# Patient Record
Sex: Female | Born: 1935
Health system: Southern US, Community
[De-identification: ages and names within clinical notes are randomized; demographics above are authoritative.]

## PROBLEM LIST (undated history)

## (undated) DIAGNOSIS — G629 Polyneuropathy, unspecified: Secondary | ICD-10-CM

## (undated) DIAGNOSIS — K219 Gastro-esophageal reflux disease without esophagitis: Secondary | ICD-10-CM

## (undated) DIAGNOSIS — M5030 Other cervical disc degeneration, unspecified cervical region: Secondary | ICD-10-CM

## (undated) DIAGNOSIS — K449 Diaphragmatic hernia without obstruction or gangrene: Secondary | ICD-10-CM

## (undated) DIAGNOSIS — Z85038 Personal history of other malignant neoplasm of large intestine: Secondary | ICD-10-CM

## (undated) DIAGNOSIS — R0602 Shortness of breath: Secondary | ICD-10-CM

## (undated) DIAGNOSIS — L89602 Pressure ulcer of unspecified heel, stage 2: Secondary | ICD-10-CM

## (undated) DIAGNOSIS — M1990 Unspecified osteoarthritis, unspecified site: Secondary | ICD-10-CM

## (undated) DIAGNOSIS — I10 Essential (primary) hypertension: Secondary | ICD-10-CM

## (undated) DIAGNOSIS — C189 Malignant neoplasm of colon, unspecified: Secondary | ICD-10-CM

## (undated) DIAGNOSIS — I509 Heart failure, unspecified: Secondary | ICD-10-CM

## (undated) DIAGNOSIS — I720 Aneurysm of carotid artery: Secondary | ICD-10-CM

## (undated) DIAGNOSIS — K5790 Diverticulosis of intestine, part unspecified, without perforation or abscess without bleeding: Secondary | ICD-10-CM

## (undated) DIAGNOSIS — J189 Pneumonia, unspecified organism: Secondary | ICD-10-CM

## (undated) DIAGNOSIS — T7840XA Allergy, unspecified, initial encounter: Secondary | ICD-10-CM

## (undated) DIAGNOSIS — Z8719 Personal history of other diseases of the digestive system: Secondary | ICD-10-CM

## (undated) DIAGNOSIS — E785 Hyperlipidemia, unspecified: Secondary | ICD-10-CM

## (undated) DIAGNOSIS — D649 Anemia, unspecified: Secondary | ICD-10-CM

## (undated) DIAGNOSIS — C50919 Malignant neoplasm of unspecified site of unspecified female breast: Secondary | ICD-10-CM

## (undated) DIAGNOSIS — I2510 Atherosclerotic heart disease of native coronary artery without angina pectoris: Secondary | ICD-10-CM

## (undated) DIAGNOSIS — I219 Acute myocardial infarction, unspecified: Secondary | ICD-10-CM

## (undated) HISTORY — DX: Hyperlipidemia, unspecified: E78.5

## (undated) HISTORY — DX: Allergy, unspecified, initial encounter: T78.40XA

## (undated) HISTORY — DX: Malignant neoplasm of colon, unspecified: C18.9

## (undated) HISTORY — DX: Essential (primary) hypertension: I10

## (undated) HISTORY — DX: Personal history of other diseases of the digestive system: Z87.19

## (undated) HISTORY — DX: Diaphragmatic hernia without obstruction or gangrene: K44.9

## (undated) HISTORY — DX: Atherosclerotic heart disease of native coronary artery without angina pectoris: I25.10

## (undated) HISTORY — DX: Anemia, unspecified: D64.9

## (undated) HISTORY — DX: Malignant neoplasm of unspecified site of unspecified female breast: C50.919

## (undated) HISTORY — DX: Polyneuropathy, unspecified: G62.9

## (undated) HISTORY — DX: Gastro-esophageal reflux disease without esophagitis: K21.9

## (undated) HISTORY — PX: BREAST SURGERY: SHX581

## (undated) HISTORY — DX: Acute myocardial infarction, unspecified: I21.9

## (undated) HISTORY — DX: Hypercalcemia: E83.52

## (undated) HISTORY — DX: Heart failure, unspecified: I50.9

## (undated) HISTORY — DX: Personal history of other malignant neoplasm of large intestine: Z85.038

## (undated) HISTORY — DX: Unspecified osteoarthritis, unspecified site: M19.90

## (undated) HISTORY — DX: Diverticulosis of intestine, part unspecified, without perforation or abscess without bleeding: K57.90

---

## 1998-03-13 ENCOUNTER — Encounter (INDEPENDENT_AMBULATORY_CARE_PROVIDER_SITE_OTHER): Payer: Self-pay | Admitting: *Deleted

## 1998-03-13 LAB — CONVERTED CEMR LAB

## 1998-04-02 ENCOUNTER — Encounter: Admission: RE | Admit: 1998-04-02 | Discharge: 1998-04-02 | Payer: Self-pay | Admitting: Family Medicine

## 1998-04-09 ENCOUNTER — Encounter: Admission: RE | Admit: 1998-04-09 | Discharge: 1998-04-09 | Payer: Self-pay | Admitting: Family Medicine

## 1998-04-23 ENCOUNTER — Encounter: Admission: RE | Admit: 1998-04-23 | Discharge: 1998-04-23 | Payer: Self-pay | Admitting: Family Medicine

## 1998-05-06 ENCOUNTER — Emergency Department (HOSPITAL_COMMUNITY): Admission: EM | Admit: 1998-05-06 | Discharge: 1998-05-06 | Payer: Self-pay | Admitting: Emergency Medicine

## 1998-06-02 ENCOUNTER — Ambulatory Visit (HOSPITAL_COMMUNITY): Admission: RE | Admit: 1998-06-02 | Discharge: 1998-06-02 | Payer: Self-pay | Admitting: Family Medicine

## 1998-06-02 ENCOUNTER — Encounter: Admission: RE | Admit: 1998-06-02 | Discharge: 1998-06-02 | Payer: Self-pay | Admitting: Family Medicine

## 1998-06-07 ENCOUNTER — Encounter: Admission: RE | Admit: 1998-06-07 | Discharge: 1998-06-07 | Payer: Self-pay | Admitting: Family Medicine

## 1998-07-02 ENCOUNTER — Emergency Department (HOSPITAL_COMMUNITY): Admission: EM | Admit: 1998-07-02 | Discharge: 1998-07-02 | Payer: Self-pay | Admitting: Emergency Medicine

## 1998-07-08 ENCOUNTER — Encounter: Admission: RE | Admit: 1998-07-08 | Discharge: 1998-07-08 | Payer: Self-pay | Admitting: Family Medicine

## 1998-08-12 ENCOUNTER — Emergency Department (HOSPITAL_COMMUNITY): Admission: EM | Admit: 1998-08-12 | Discharge: 1998-08-12 | Payer: Self-pay | Admitting: Emergency Medicine

## 1998-11-29 ENCOUNTER — Encounter: Admission: RE | Admit: 1998-11-29 | Discharge: 1998-11-29 | Payer: Self-pay | Admitting: Family Medicine

## 1998-12-02 ENCOUNTER — Encounter: Admission: RE | Admit: 1998-12-02 | Discharge: 1998-12-02 | Payer: Self-pay | Admitting: Family Medicine

## 1998-12-20 ENCOUNTER — Encounter: Admission: RE | Admit: 1998-12-20 | Discharge: 1998-12-20 | Payer: Self-pay | Admitting: Sports Medicine

## 1999-01-24 ENCOUNTER — Emergency Department (HOSPITAL_COMMUNITY): Admission: EM | Admit: 1999-01-24 | Discharge: 1999-01-24 | Payer: Self-pay | Admitting: Emergency Medicine

## 1999-01-24 ENCOUNTER — Encounter: Payer: Self-pay | Admitting: Emergency Medicine

## 1999-01-28 ENCOUNTER — Emergency Department (HOSPITAL_COMMUNITY): Admission: EM | Admit: 1999-01-28 | Discharge: 1999-01-28 | Payer: Self-pay | Admitting: Emergency Medicine

## 1999-02-01 ENCOUNTER — Encounter: Admission: RE | Admit: 1999-02-01 | Discharge: 1999-02-01 | Payer: Self-pay | Admitting: Sports Medicine

## 1999-05-11 ENCOUNTER — Encounter: Admission: RE | Admit: 1999-05-11 | Discharge: 1999-05-11 | Payer: Self-pay | Admitting: Sports Medicine

## 1999-06-03 ENCOUNTER — Encounter: Admission: RE | Admit: 1999-06-03 | Discharge: 1999-06-03 | Payer: Self-pay | Admitting: Family Medicine

## 1999-08-22 ENCOUNTER — Encounter: Admission: RE | Admit: 1999-08-22 | Discharge: 1999-08-22 | Payer: Self-pay | Admitting: Family Medicine

## 1999-08-29 ENCOUNTER — Encounter: Admission: RE | Admit: 1999-08-29 | Discharge: 1999-08-29 | Payer: Self-pay | Admitting: Family Medicine

## 1999-08-31 ENCOUNTER — Encounter: Admission: RE | Admit: 1999-08-31 | Discharge: 1999-08-31 | Payer: Self-pay | Admitting: Family Medicine

## 1999-09-15 ENCOUNTER — Encounter: Admission: RE | Admit: 1999-09-15 | Discharge: 1999-09-15 | Payer: Self-pay | Admitting: Family Medicine

## 1999-11-14 HISTORY — PX: TOTAL KNEE ARTHROPLASTY: SHX125

## 1999-11-14 HISTORY — PX: ESOPHAGOGASTRODUODENOSCOPY: SHX1529

## 1999-11-14 HISTORY — PX: JOINT REPLACEMENT: SHX530

## 1999-12-07 ENCOUNTER — Encounter: Admission: RE | Admit: 1999-12-07 | Discharge: 1999-12-07 | Payer: Self-pay

## 1999-12-22 ENCOUNTER — Encounter: Admission: RE | Admit: 1999-12-22 | Discharge: 1999-12-22 | Payer: Self-pay | Admitting: Sports Medicine

## 1999-12-22 ENCOUNTER — Encounter: Payer: Self-pay | Admitting: Sports Medicine

## 2000-01-12 ENCOUNTER — Ambulatory Visit (HOSPITAL_COMMUNITY): Admission: RE | Admit: 2000-01-12 | Discharge: 2000-01-12 | Payer: Self-pay | Admitting: Gastroenterology

## 2000-04-10 ENCOUNTER — Emergency Department (HOSPITAL_COMMUNITY): Admission: EM | Admit: 2000-04-10 | Discharge: 2000-04-10 | Payer: Self-pay | Admitting: Emergency Medicine

## 2000-04-10 ENCOUNTER — Encounter: Payer: Self-pay | Admitting: Emergency Medicine

## 2000-05-15 ENCOUNTER — Encounter: Admission: RE | Admit: 2000-05-15 | Discharge: 2000-05-15 | Payer: Self-pay | Admitting: Family Medicine

## 2000-05-18 ENCOUNTER — Encounter: Admission: RE | Admit: 2000-05-18 | Discharge: 2000-05-18 | Payer: Self-pay | Admitting: Family Medicine

## 2000-05-28 ENCOUNTER — Encounter: Admission: RE | Admit: 2000-05-28 | Discharge: 2000-05-28 | Payer: Self-pay | Admitting: Family Medicine

## 2000-06-15 ENCOUNTER — Encounter: Admission: RE | Admit: 2000-06-15 | Discharge: 2000-06-15 | Payer: Self-pay | Admitting: Family Medicine

## 2000-06-18 ENCOUNTER — Encounter: Payer: Self-pay | Admitting: Gastroenterology

## 2000-06-20 ENCOUNTER — Inpatient Hospital Stay (HOSPITAL_COMMUNITY): Admission: RE | Admit: 2000-06-20 | Discharge: 2000-06-28 | Payer: Self-pay | Admitting: Orthopedic Surgery

## 2000-06-21 ENCOUNTER — Encounter: Payer: Self-pay | Admitting: Orthopedic Surgery

## 2000-06-25 ENCOUNTER — Encounter: Payer: Self-pay | Admitting: Orthopedic Surgery

## 2000-07-25 ENCOUNTER — Encounter: Admission: RE | Admit: 2000-07-25 | Discharge: 2000-07-25 | Payer: Self-pay | Admitting: Family Medicine

## 2000-09-17 ENCOUNTER — Encounter: Admission: RE | Admit: 2000-09-17 | Discharge: 2000-09-17 | Payer: Self-pay | Admitting: Family Medicine

## 2000-09-24 ENCOUNTER — Encounter: Admission: RE | Admit: 2000-09-24 | Discharge: 2000-10-11 | Payer: Self-pay | Admitting: Orthopedic Surgery

## 2000-10-01 ENCOUNTER — Ambulatory Visit: Admission: RE | Admit: 2000-10-01 | Discharge: 2000-10-01 | Payer: Self-pay | Admitting: Sports Medicine

## 2000-10-01 ENCOUNTER — Encounter: Admission: RE | Admit: 2000-10-01 | Discharge: 2000-10-01 | Payer: Self-pay | Admitting: Sports Medicine

## 2000-10-30 ENCOUNTER — Encounter: Admission: RE | Admit: 2000-10-30 | Discharge: 2000-10-30 | Payer: Self-pay | Admitting: Sports Medicine

## 2001-01-02 ENCOUNTER — Encounter: Admission: RE | Admit: 2001-01-02 | Discharge: 2001-01-02 | Payer: Self-pay | Admitting: Sports Medicine

## 2001-01-02 ENCOUNTER — Encounter: Payer: Self-pay | Admitting: Sports Medicine

## 2001-03-29 ENCOUNTER — Encounter: Admission: RE | Admit: 2001-03-29 | Discharge: 2001-03-29 | Payer: Self-pay | Admitting: Family Medicine

## 2001-06-24 ENCOUNTER — Emergency Department (HOSPITAL_COMMUNITY): Admission: EM | Admit: 2001-06-24 | Discharge: 2001-06-24 | Payer: Self-pay | Admitting: Emergency Medicine

## 2001-06-24 ENCOUNTER — Encounter: Admission: RE | Admit: 2001-06-24 | Discharge: 2001-06-24 | Payer: Self-pay | Admitting: Pediatrics

## 2001-07-12 ENCOUNTER — Encounter: Admission: RE | Admit: 2001-07-12 | Discharge: 2001-07-12 | Payer: Self-pay | Admitting: Family Medicine

## 2001-08-09 ENCOUNTER — Encounter: Admission: RE | Admit: 2001-08-09 | Discharge: 2001-08-09 | Payer: Self-pay | Admitting: Family Medicine

## 2001-10-23 ENCOUNTER — Encounter: Admission: RE | Admit: 2001-10-23 | Discharge: 2001-10-23 | Payer: Self-pay | Admitting: Family Medicine

## 2002-01-06 ENCOUNTER — Encounter: Admission: RE | Admit: 2002-01-06 | Discharge: 2002-01-06 | Payer: Self-pay | Admitting: *Deleted

## 2002-01-06 ENCOUNTER — Encounter: Payer: Self-pay | Admitting: *Deleted

## 2002-01-20 ENCOUNTER — Encounter: Admission: RE | Admit: 2002-01-20 | Discharge: 2002-01-20 | Payer: Self-pay | Admitting: Family Medicine

## 2002-02-25 ENCOUNTER — Encounter: Admission: RE | Admit: 2002-02-25 | Discharge: 2002-02-25 | Payer: Self-pay | Admitting: Family Medicine

## 2002-03-04 ENCOUNTER — Encounter: Admission: RE | Admit: 2002-03-04 | Discharge: 2002-03-04 | Payer: Self-pay | Admitting: Family Medicine

## 2002-03-05 ENCOUNTER — Emergency Department (HOSPITAL_COMMUNITY): Admission: EM | Admit: 2002-03-05 | Discharge: 2002-03-05 | Payer: Self-pay

## 2002-03-13 ENCOUNTER — Encounter: Admission: RE | Admit: 2002-03-13 | Discharge: 2002-03-13 | Payer: Self-pay | Admitting: Family Medicine

## 2002-03-20 ENCOUNTER — Encounter: Admission: RE | Admit: 2002-03-20 | Discharge: 2002-03-20 | Payer: Self-pay | Admitting: Family Medicine

## 2002-03-21 ENCOUNTER — Ambulatory Visit (HOSPITAL_COMMUNITY): Admission: RE | Admit: 2002-03-21 | Discharge: 2002-03-21 | Payer: Self-pay | Admitting: *Deleted

## 2002-06-16 ENCOUNTER — Encounter: Admission: RE | Admit: 2002-06-16 | Discharge: 2002-06-16 | Payer: Self-pay | Admitting: Family Medicine

## 2002-06-26 ENCOUNTER — Encounter: Admission: RE | Admit: 2002-06-26 | Discharge: 2002-06-26 | Payer: Self-pay | Admitting: Family Medicine

## 2002-06-30 ENCOUNTER — Encounter: Admission: RE | Admit: 2002-06-30 | Discharge: 2002-07-24 | Payer: Self-pay | Admitting: Orthopedic Surgery

## 2002-07-03 ENCOUNTER — Encounter: Admission: RE | Admit: 2002-07-03 | Discharge: 2002-07-03 | Payer: Self-pay | Admitting: Family Medicine

## 2002-08-15 ENCOUNTER — Encounter: Admission: RE | Admit: 2002-08-15 | Discharge: 2002-08-15 | Payer: Self-pay | Admitting: Family Medicine

## 2002-08-29 ENCOUNTER — Encounter: Admission: RE | Admit: 2002-08-29 | Discharge: 2002-08-29 | Payer: Self-pay | Admitting: Family Medicine

## 2002-10-15 ENCOUNTER — Encounter: Admission: RE | Admit: 2002-10-15 | Discharge: 2002-10-15 | Payer: Self-pay | Admitting: Family Medicine

## 2002-11-12 ENCOUNTER — Encounter: Admission: RE | Admit: 2002-11-12 | Discharge: 2002-11-12 | Payer: Self-pay | Admitting: Family Medicine

## 2002-11-24 ENCOUNTER — Encounter: Admission: RE | Admit: 2002-11-24 | Discharge: 2002-11-24 | Payer: Self-pay | Admitting: Sports Medicine

## 2002-12-25 ENCOUNTER — Encounter: Admission: RE | Admit: 2002-12-25 | Discharge: 2002-12-25 | Payer: Self-pay | Admitting: Family Medicine

## 2003-01-03 ENCOUNTER — Emergency Department (HOSPITAL_COMMUNITY): Admission: EM | Admit: 2003-01-03 | Discharge: 2003-01-03 | Payer: Self-pay | Admitting: Emergency Medicine

## 2003-01-03 ENCOUNTER — Encounter: Payer: Self-pay | Admitting: Emergency Medicine

## 2003-01-12 ENCOUNTER — Encounter: Admission: RE | Admit: 2003-01-12 | Discharge: 2003-01-12 | Payer: Self-pay | Admitting: *Deleted

## 2003-01-15 ENCOUNTER — Encounter: Admission: RE | Admit: 2003-01-15 | Discharge: 2003-01-15 | Payer: Self-pay | Admitting: Sports Medicine

## 2003-01-15 ENCOUNTER — Encounter: Payer: Self-pay | Admitting: Sports Medicine

## 2003-01-26 ENCOUNTER — Encounter: Admission: RE | Admit: 2003-01-26 | Discharge: 2003-01-26 | Payer: Self-pay | Admitting: Family Medicine

## 2003-05-04 ENCOUNTER — Emergency Department (HOSPITAL_COMMUNITY): Admission: AD | Admit: 2003-05-04 | Discharge: 2003-05-04 | Payer: Self-pay | Admitting: Emergency Medicine

## 2003-05-14 ENCOUNTER — Encounter: Admission: RE | Admit: 2003-05-14 | Discharge: 2003-05-14 | Payer: Self-pay | Admitting: Family Medicine

## 2003-05-15 ENCOUNTER — Encounter: Admission: RE | Admit: 2003-05-15 | Discharge: 2003-05-15 | Payer: Self-pay | Admitting: Family Medicine

## 2003-06-08 ENCOUNTER — Encounter: Admission: RE | Admit: 2003-06-08 | Discharge: 2003-06-08 | Payer: Self-pay | Admitting: Sports Medicine

## 2003-11-16 ENCOUNTER — Encounter: Admission: RE | Admit: 2003-11-16 | Discharge: 2003-11-16 | Payer: Self-pay | Admitting: Family Medicine

## 2003-11-24 ENCOUNTER — Encounter: Admission: RE | Admit: 2003-11-24 | Discharge: 2003-11-24 | Payer: Self-pay | Admitting: Family Medicine

## 2003-12-28 ENCOUNTER — Emergency Department (HOSPITAL_COMMUNITY): Admission: EM | Admit: 2003-12-28 | Discharge: 2003-12-28 | Payer: Self-pay | Admitting: Family Medicine

## 2003-12-29 ENCOUNTER — Encounter: Admission: RE | Admit: 2003-12-29 | Discharge: 2003-12-29 | Payer: Self-pay | Admitting: Family Medicine

## 2004-01-06 ENCOUNTER — Encounter: Admission: RE | Admit: 2004-01-06 | Discharge: 2004-01-06 | Payer: Self-pay | Admitting: Family Medicine

## 2004-01-21 ENCOUNTER — Encounter: Admission: RE | Admit: 2004-01-21 | Discharge: 2004-01-21 | Payer: Self-pay | Admitting: Sports Medicine

## 2004-02-29 ENCOUNTER — Encounter: Admission: RE | Admit: 2004-02-29 | Discharge: 2004-02-29 | Payer: Self-pay | Admitting: Family Medicine

## 2004-03-01 ENCOUNTER — Emergency Department (HOSPITAL_COMMUNITY): Admission: EM | Admit: 2004-03-01 | Discharge: 2004-03-01 | Payer: Self-pay | Admitting: Emergency Medicine

## 2004-03-04 ENCOUNTER — Ambulatory Visit (HOSPITAL_COMMUNITY): Admission: RE | Admit: 2004-03-04 | Discharge: 2004-03-04 | Payer: Self-pay | Admitting: Family Medicine

## 2004-03-04 ENCOUNTER — Encounter: Admission: RE | Admit: 2004-03-04 | Discharge: 2004-03-04 | Payer: Self-pay | Admitting: Family Medicine

## 2004-03-15 ENCOUNTER — Encounter: Admission: RE | Admit: 2004-03-15 | Discharge: 2004-03-15 | Payer: Self-pay | Admitting: Sports Medicine
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
## 2004-04-29 ENCOUNTER — Encounter: Admission: RE | Admit: 2004-04-29 | Discharge: 2004-04-29 | Payer: Self-pay | Admitting: Family Medicine

## 2004-05-26 ENCOUNTER — Emergency Department (HOSPITAL_COMMUNITY): Admission: EM | Admit: 2004-05-26 | Discharge: 2004-05-27 | Payer: Self-pay | Admitting: *Deleted

## 2004-06-06 ENCOUNTER — Emergency Department (HOSPITAL_COMMUNITY): Admission: EM | Admit: 2004-06-06 | Discharge: 2004-06-06 | Payer: Self-pay | Admitting: Emergency Medicine

## 2004-06-17 ENCOUNTER — Encounter: Admission: RE | Admit: 2004-06-17 | Discharge: 2004-06-17 | Payer: Self-pay | Admitting: Family Medicine

## 2004-06-28 ENCOUNTER — Emergency Department (HOSPITAL_COMMUNITY): Admission: EM | Admit: 2004-06-28 | Discharge: 2004-06-28 | Payer: Self-pay | Admitting: Emergency Medicine

## 2004-07-06 ENCOUNTER — Encounter: Admission: RE | Admit: 2004-07-06 | Discharge: 2004-07-06 | Payer: Self-pay | Admitting: Family Medicine

## 2004-07-18 IMAGING — CR DG CHEST 1V PORT
1 series · 1 of 1 positions shown · non-contrast
Comparison: none

CLINICAL DATA: Shortness of breath; lt shoulder pain
 PORTABLE CHEST
 Comparing to report from prior chest radiograph dated 01/03/03.  
 There is linear scarring at both lung bases as reported previously.  The lungs appear otherwise clear.  Heart and mediastinum appear unremarkable. 
 IMPRESSION
 Bibasilar scarring.  No acute findings.

[view not recorded]
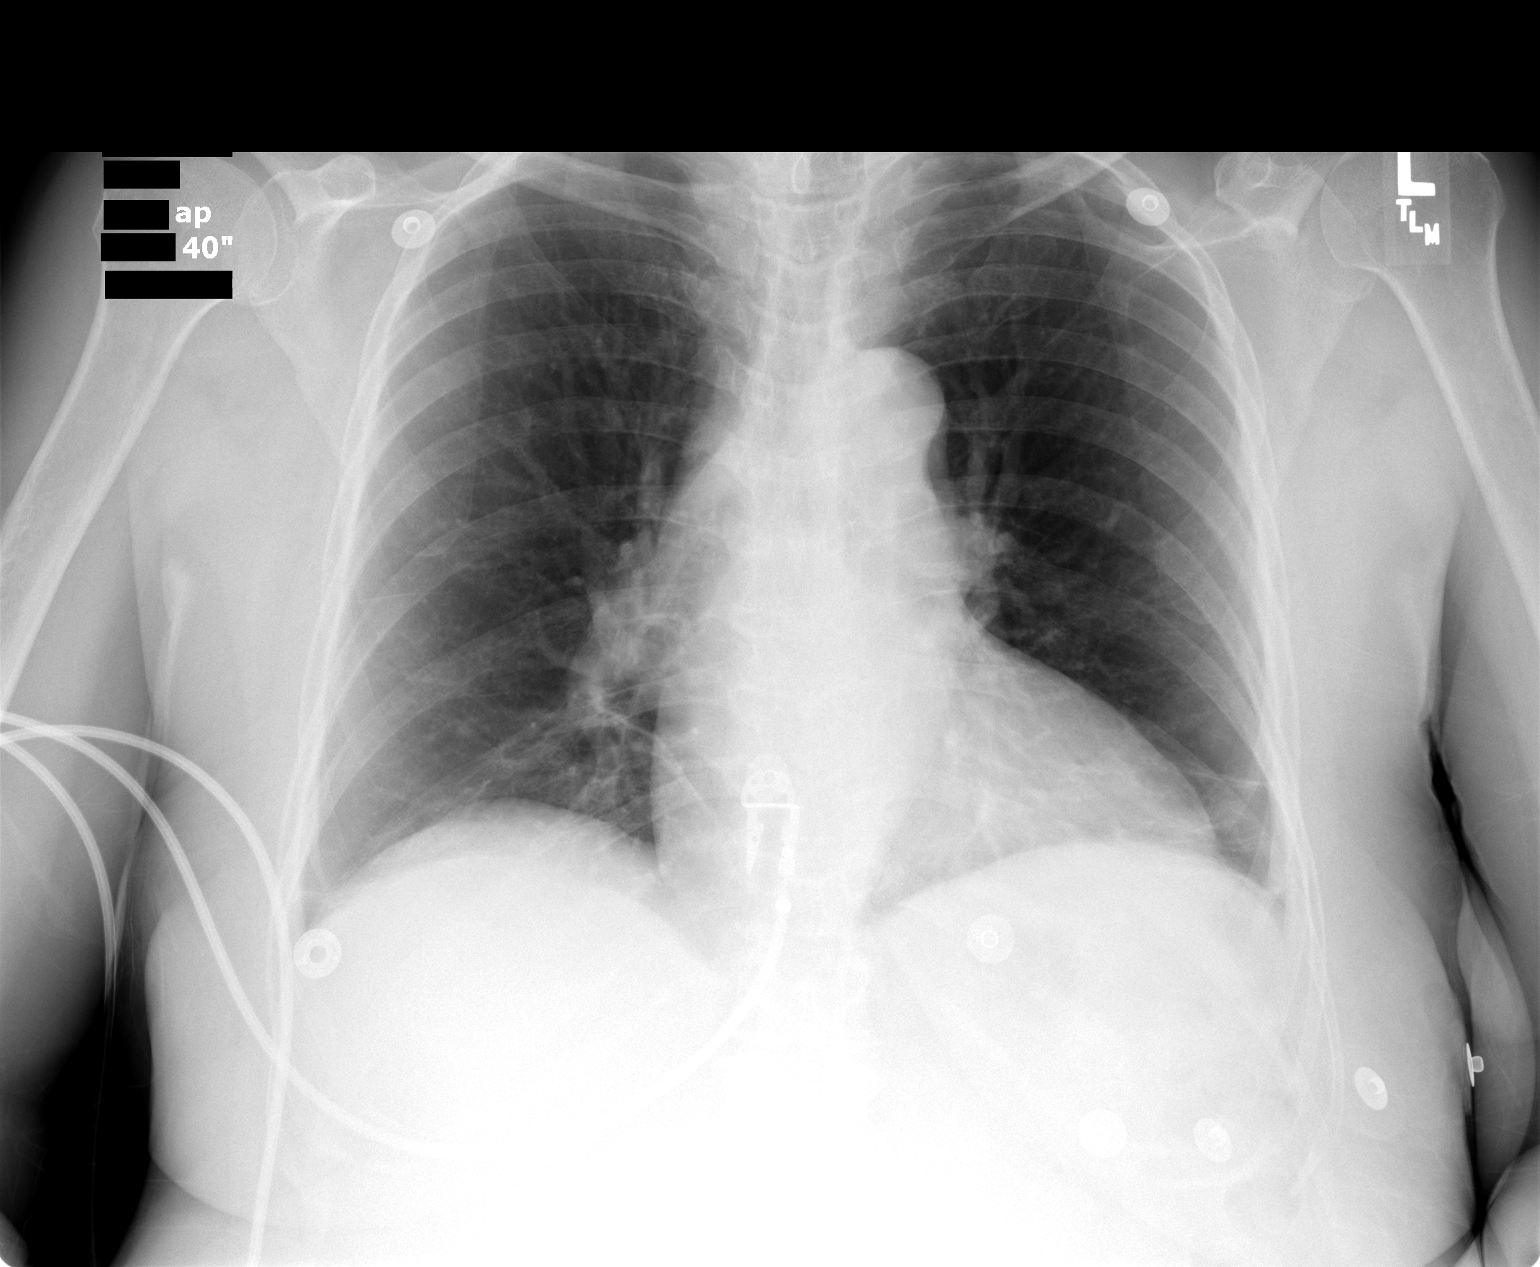

[1 of 1 positions shown; findings below may reference images not displayed]

## 2004-07-28 IMAGING — CR DG HIP COMPLETE 2+V*R*
3 series · 3 of 3 positions shown · non-contrast
Comparison: None.

CLINICAL DATA: 80-year-old with shoulder and leg pain. Patient fell a couple of weeks ago.  Unable to bear weight. 
 RIGHT HIP COMPLETE

[view not recorded (1 of 3)]
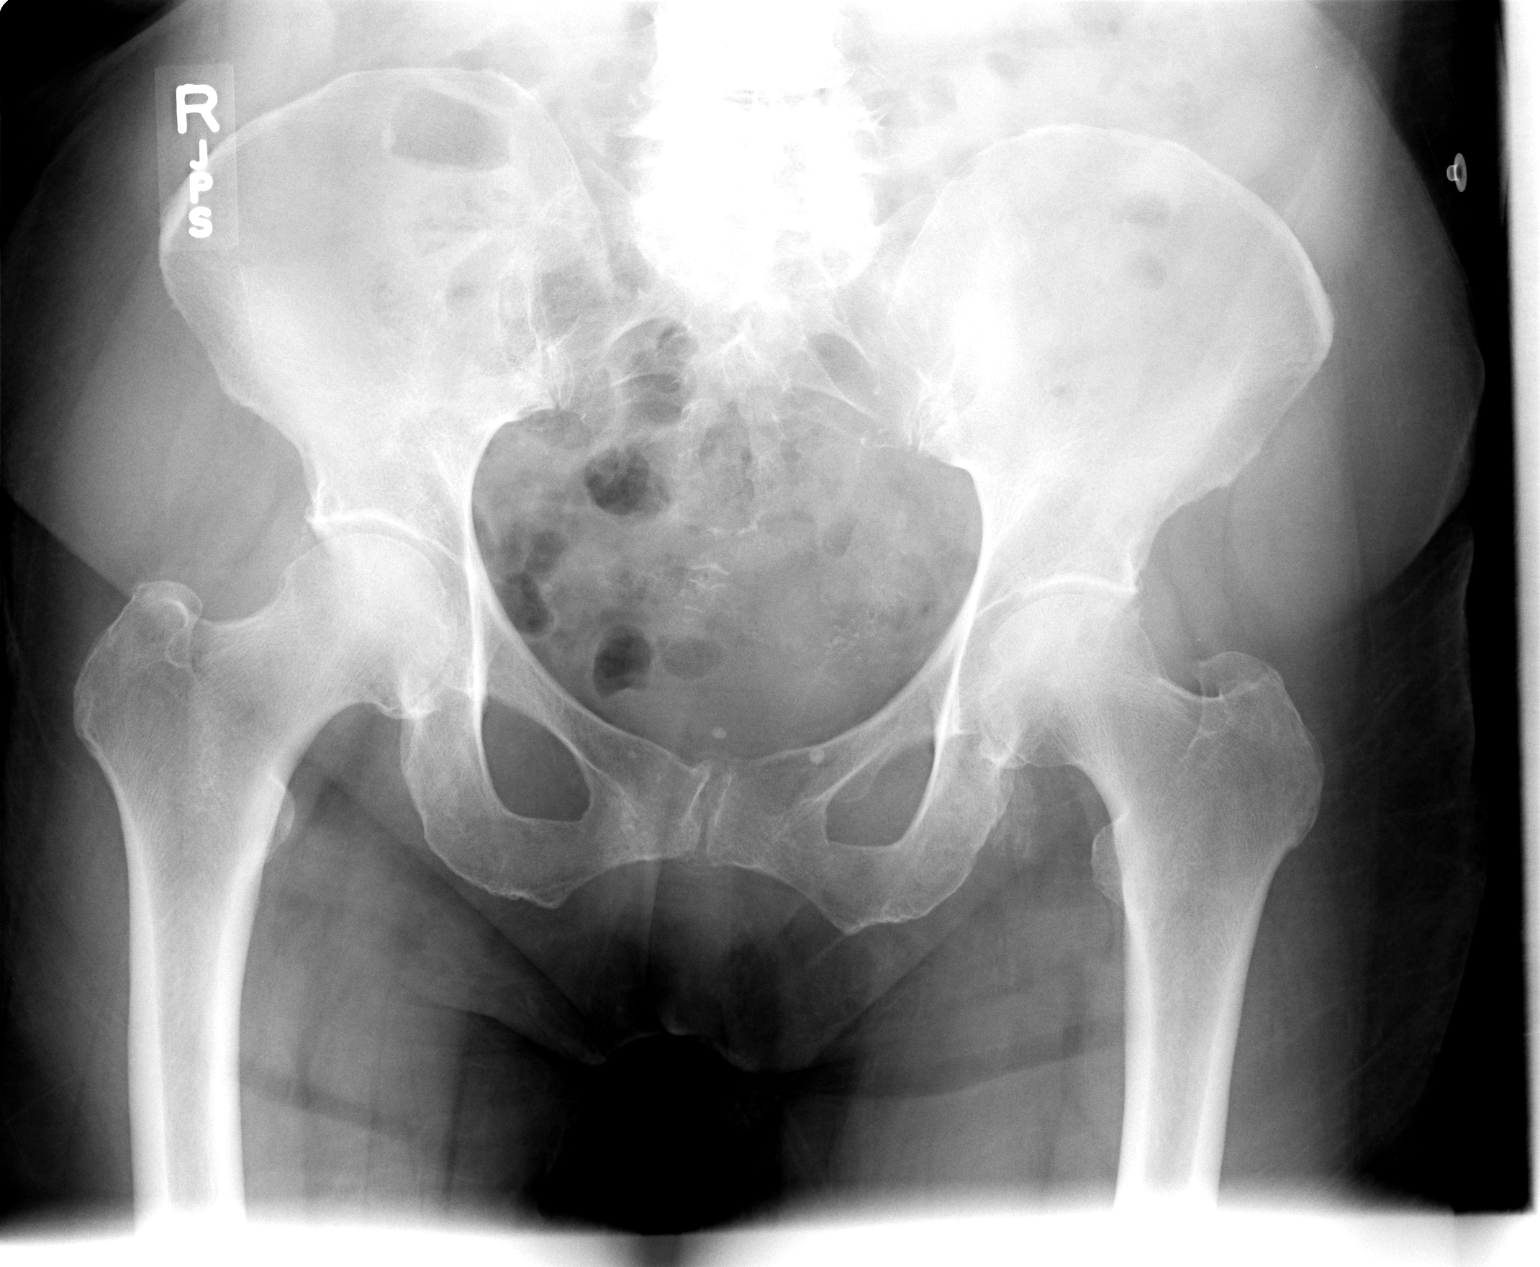

[view not recorded (2 of 3)]
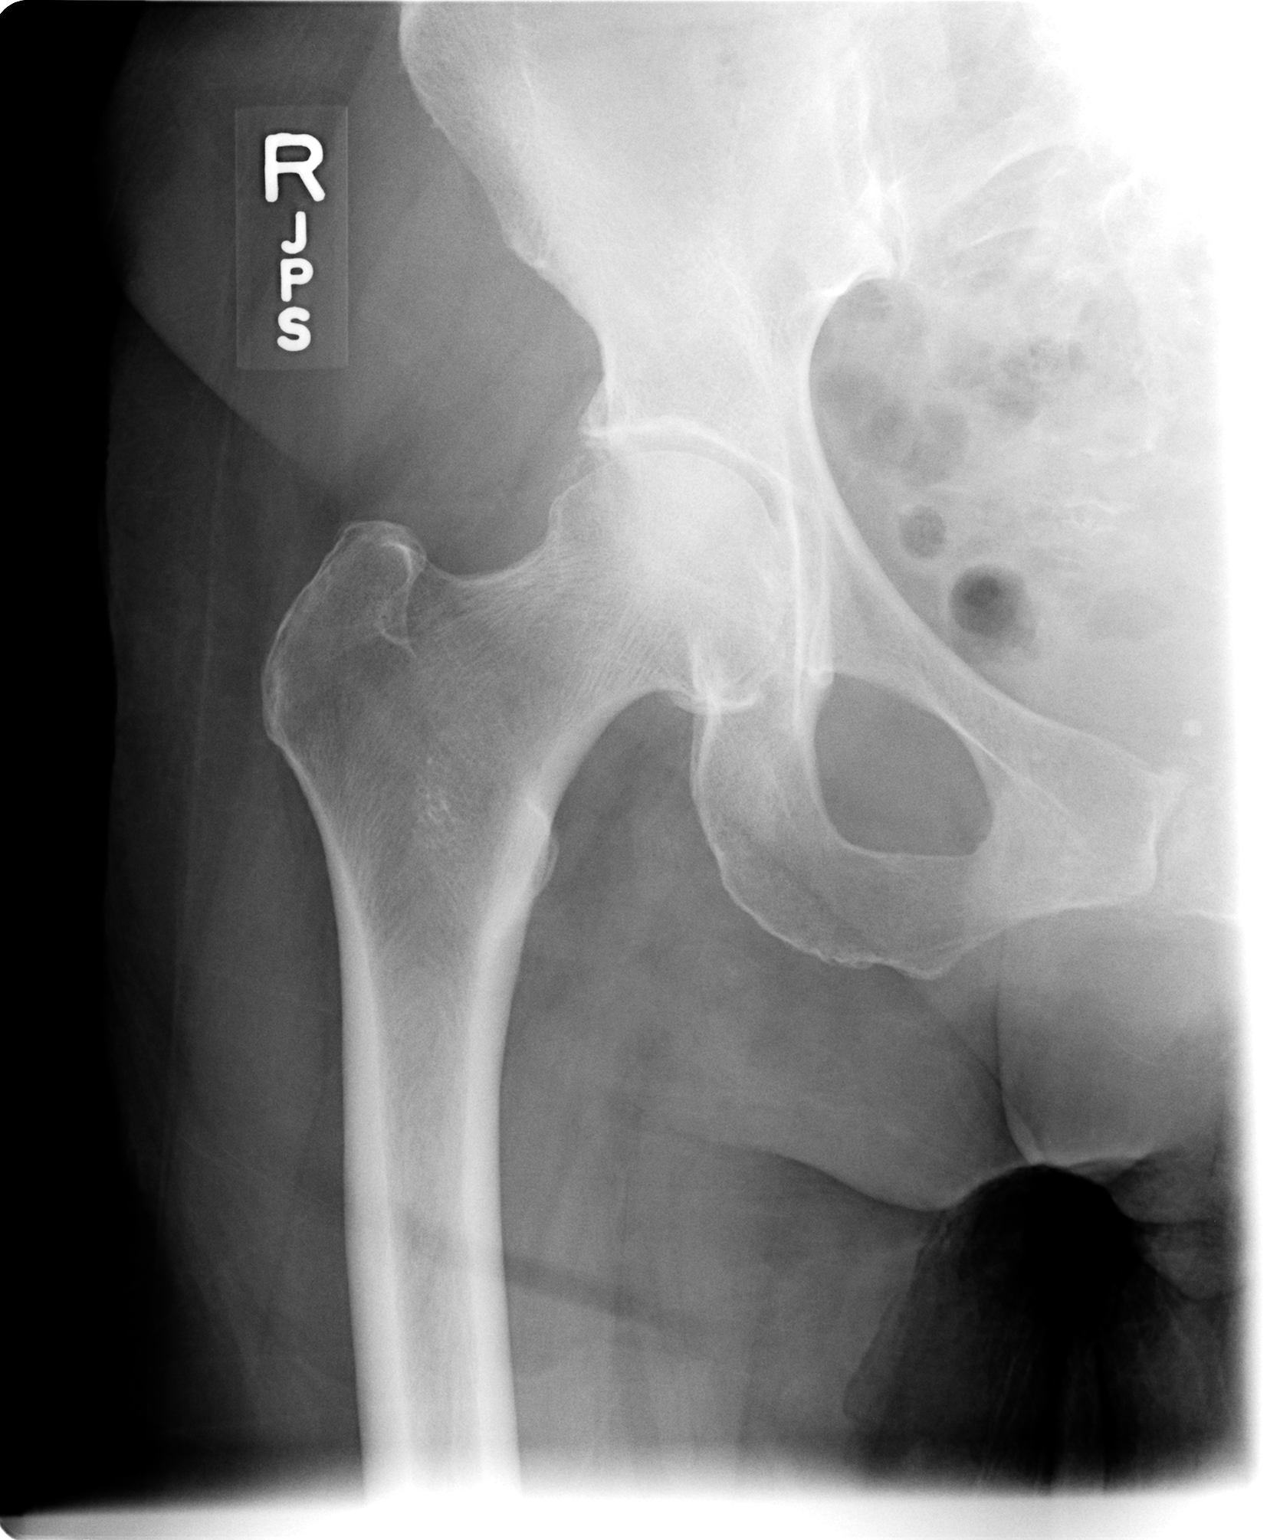

[view not recorded (3 of 3)]
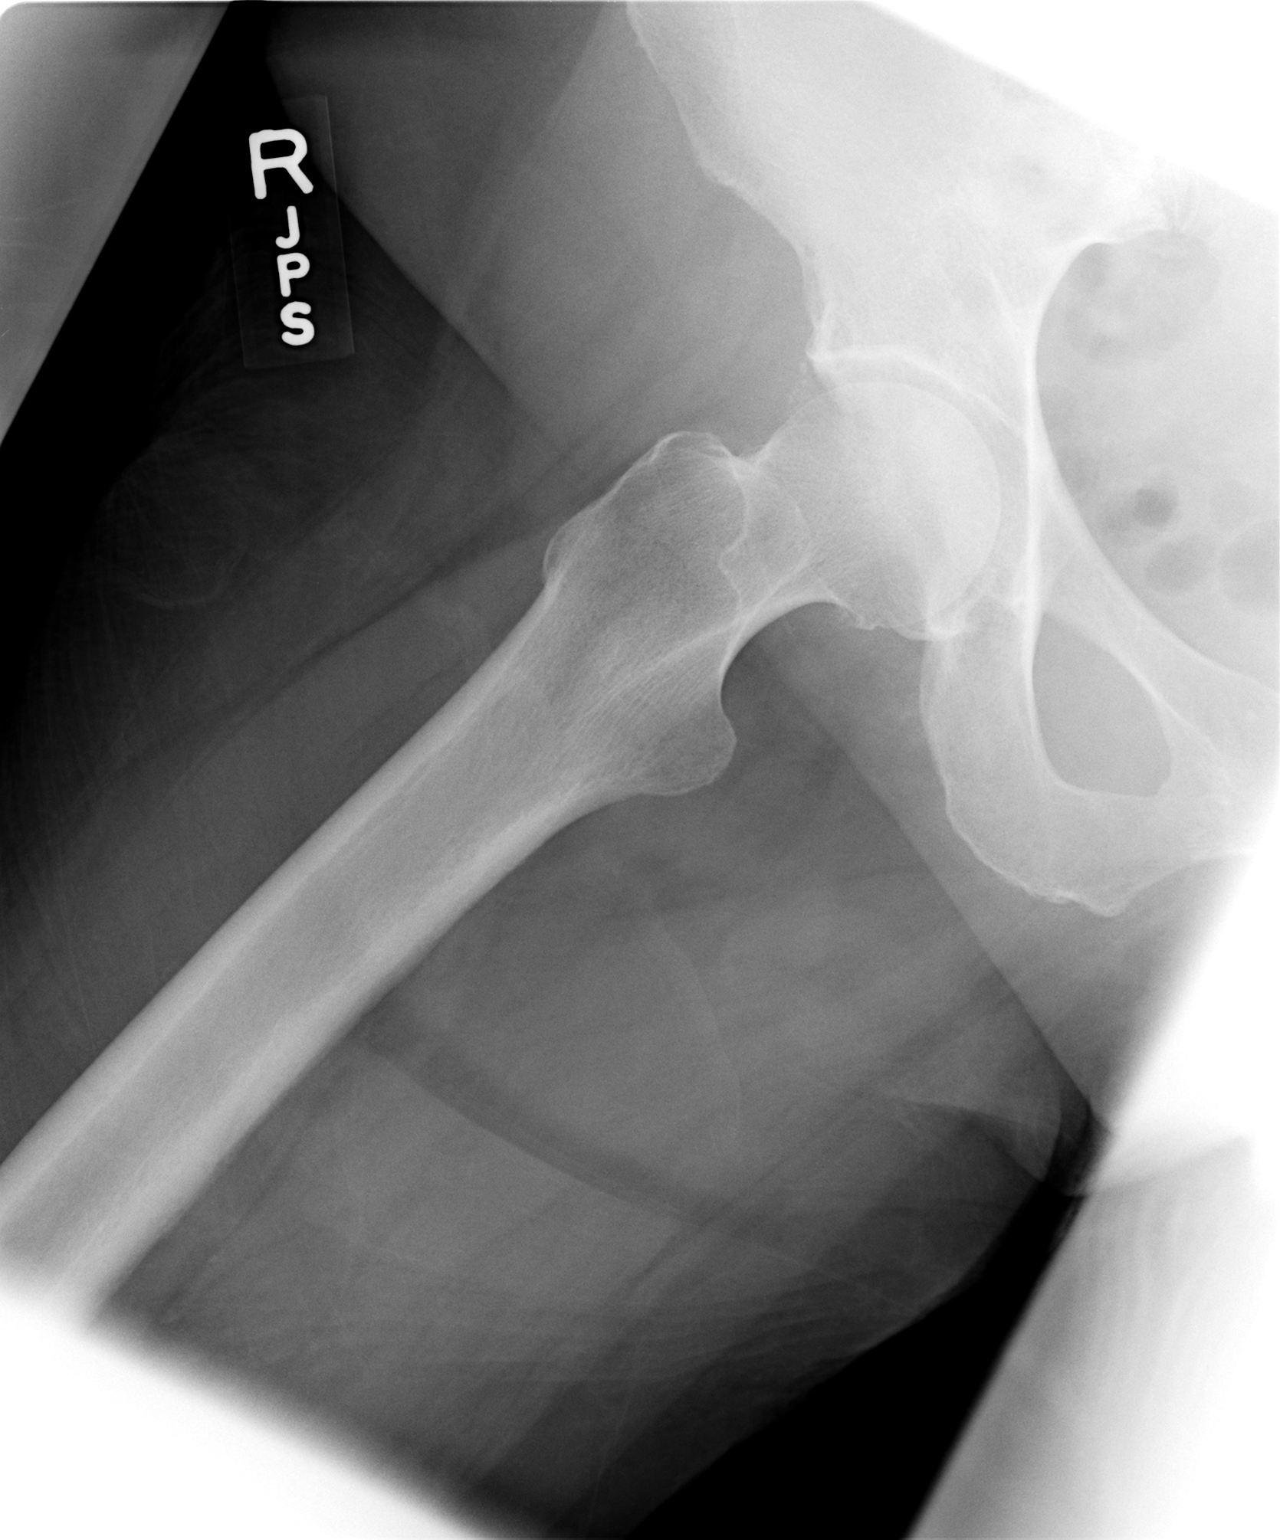

[3 of 3 positions shown; findings below may reference images not displayed]

AP and lateral views of the right hip are performed showing no evidence for acute fracture or dislocation.  Degenerative changes are noted in both hips as well as the lower spine.  Regional bowel gas pattern is nonobstructive. 
 IMPRESSION
 Degenerative changes without evidence for acute abnormality of the right hip.

## 2004-08-04 ENCOUNTER — Encounter: Admission: RE | Admit: 2004-08-04 | Discharge: 2004-08-04 | Payer: Self-pay | Admitting: Sports Medicine

## 2004-08-05 ENCOUNTER — Ambulatory Visit: Payer: Self-pay | Admitting: Family Medicine

## 2004-08-18 ENCOUNTER — Ambulatory Visit: Payer: Self-pay | Admitting: Family Medicine

## 2004-09-22 ENCOUNTER — Ambulatory Visit: Payer: Self-pay | Admitting: Family Medicine

## 2004-09-25 IMAGING — CR DG FINGER THUMB 2+V*L*
2 series · 2 of 2 positions shown · non-contrast
Comparison: none

CLINICAL DATA: Pain. 
 LEFT THUMB: 
 Three views of the left thumb show degenerative change at the base of the left 1st metacarpal as it articulates with carpal bones.  No acute abnormality is seen.

[view not recorded (1 of 2)]
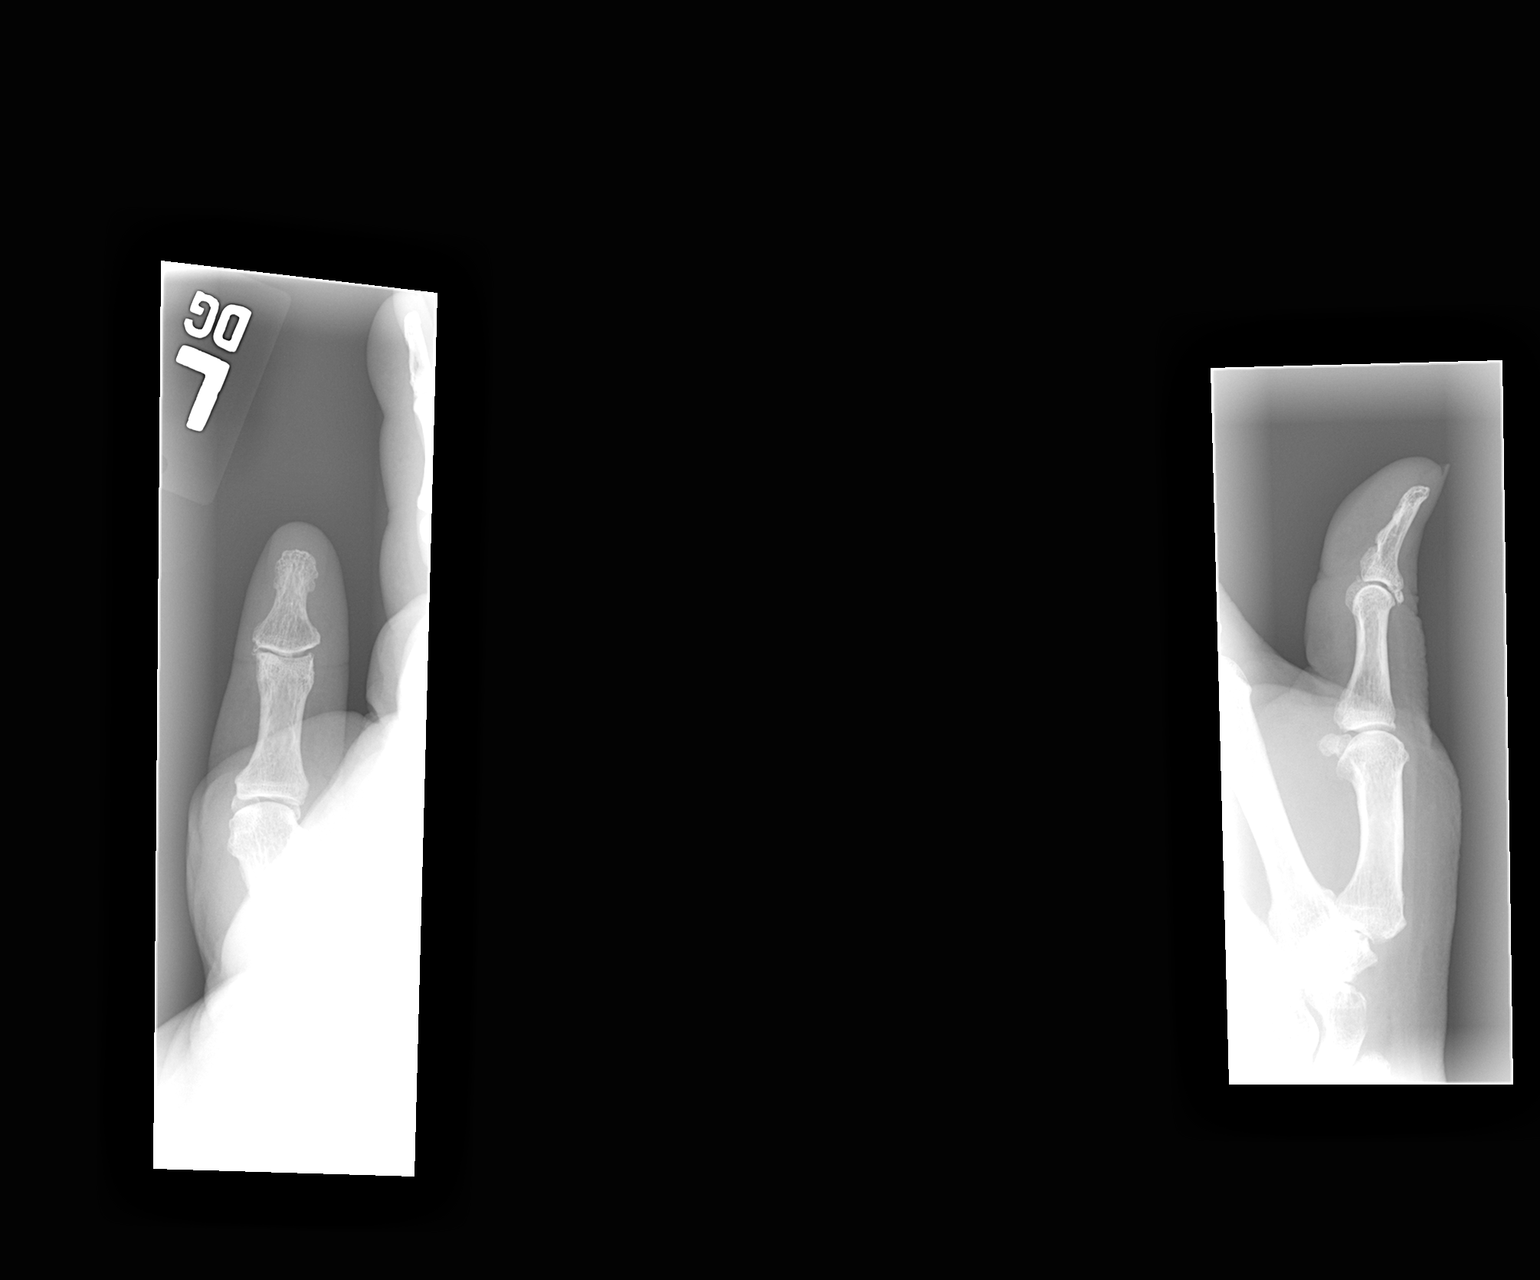

[view not recorded (2 of 2)]
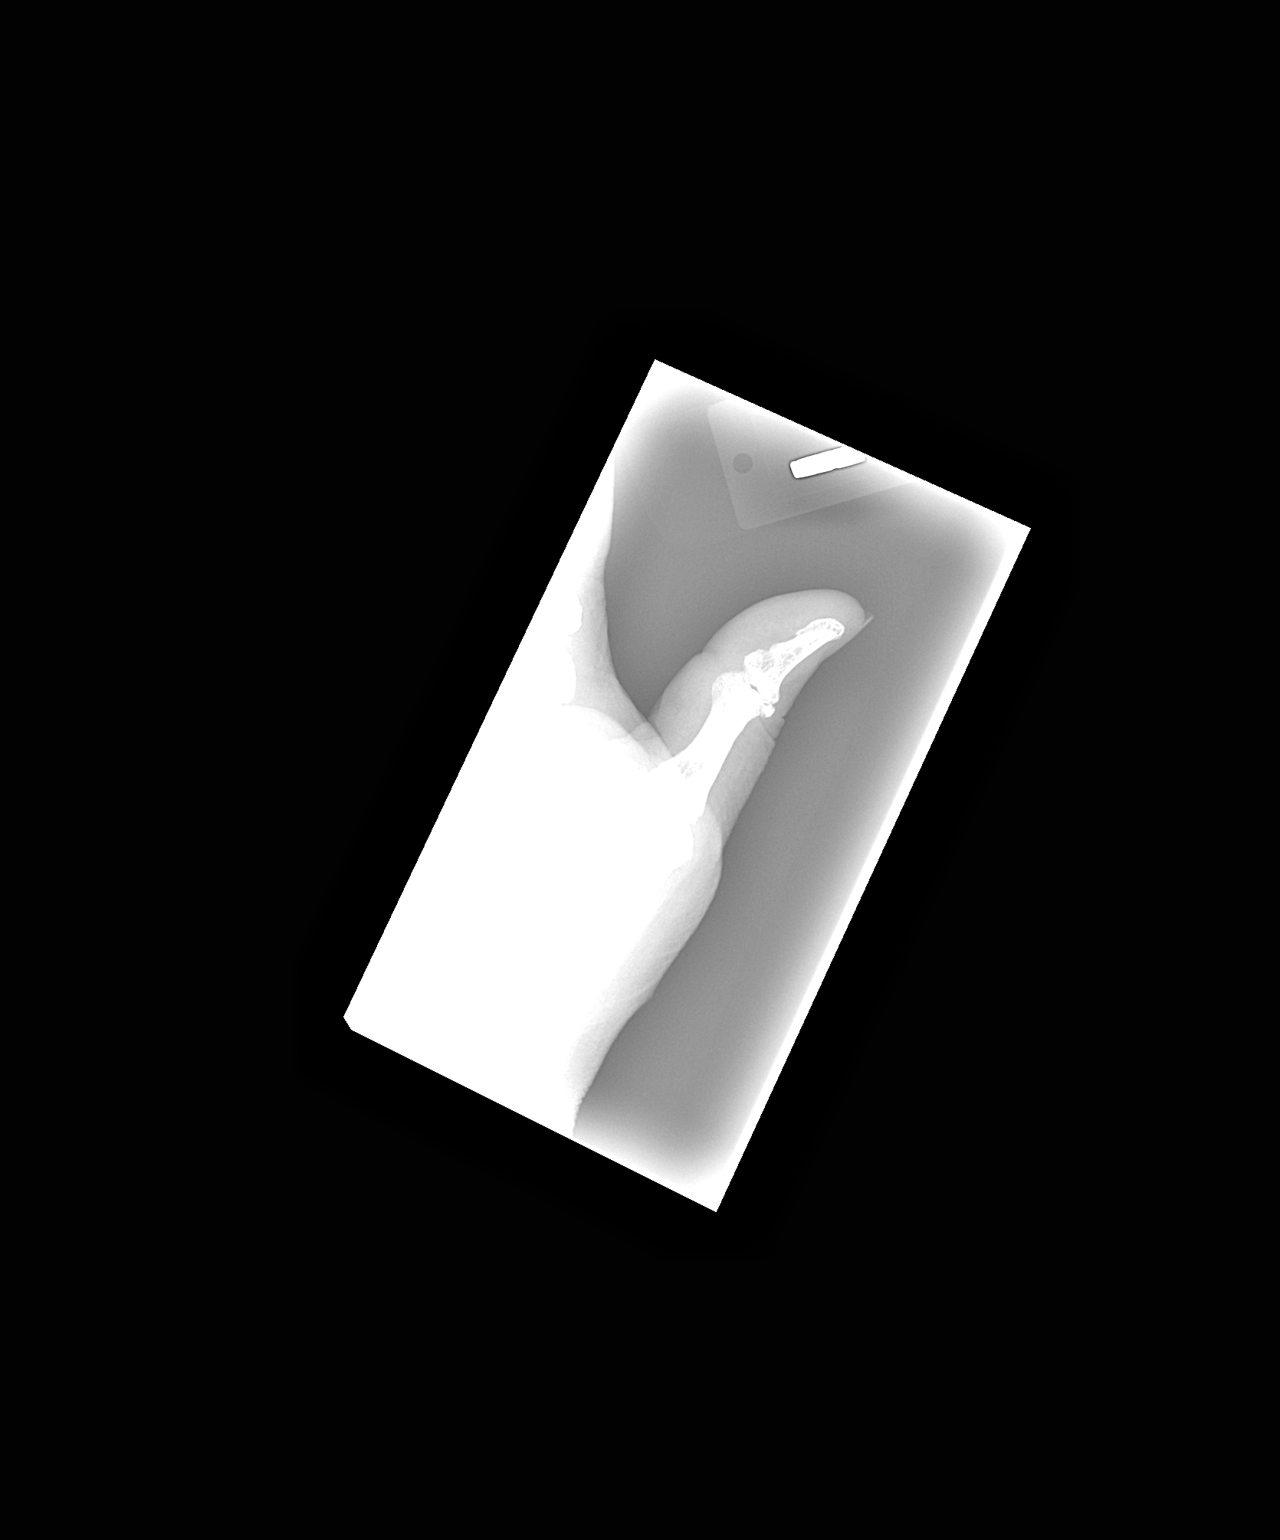

[2 of 2 positions shown; findings below may reference images not displayed]

IMPRESSION: Degenerative change.  No acute abnormality.

## 2004-10-04 ENCOUNTER — Ambulatory Visit: Payer: Self-pay | Admitting: Family Medicine

## 2004-10-12 ENCOUNTER — Ambulatory Visit: Payer: Self-pay | Admitting: Family Medicine

## 2004-10-18 ENCOUNTER — Ambulatory Visit: Payer: Self-pay | Admitting: Sports Medicine

## 2004-10-19 ENCOUNTER — Emergency Department (HOSPITAL_COMMUNITY): Admission: EM | Admit: 2004-10-19 | Discharge: 2004-10-20 | Payer: Self-pay | Admitting: Emergency Medicine

## 2004-12-11 IMAGING — CT CT HEAD W/O CM
1 series · 16 of 28 positions shown, 20 images · IV contrast (agent unspecified)
Comparison: none

CLINICAL DATA: Dizziness, diabetic. 
HEAD CT WITHOUT CONTRAST: 
Multidetector CTscanning through the head performed.
Comparison to 03/06/02

[Series 2: brain · axial · 0.49mm/px · z∈[+126,+255]mm · 16 of 28 slices shown, 20 images]
[im 2/28  brain]
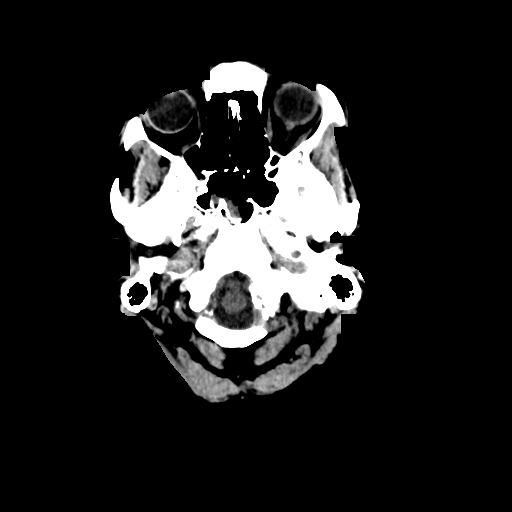
[im 2/28  bone]
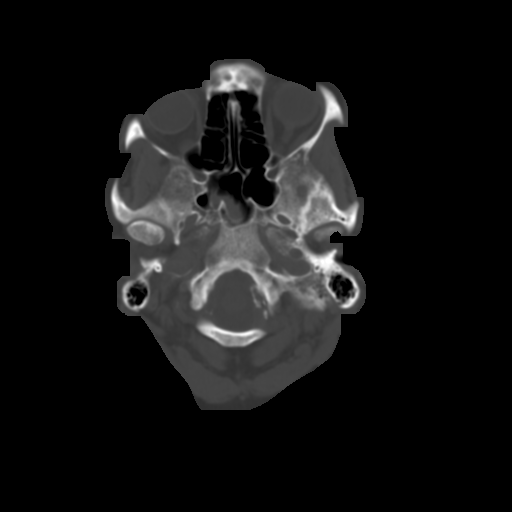
[im 4/28  brain]
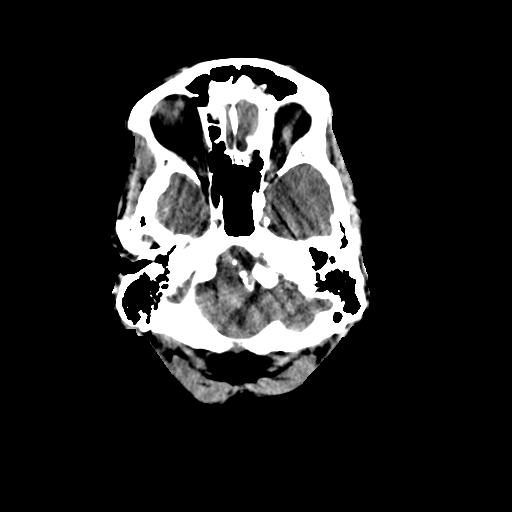
[im 6/28  brain]
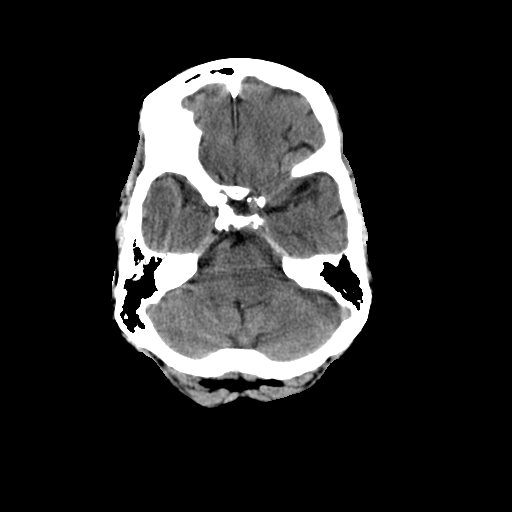
[im 7/28  brain]
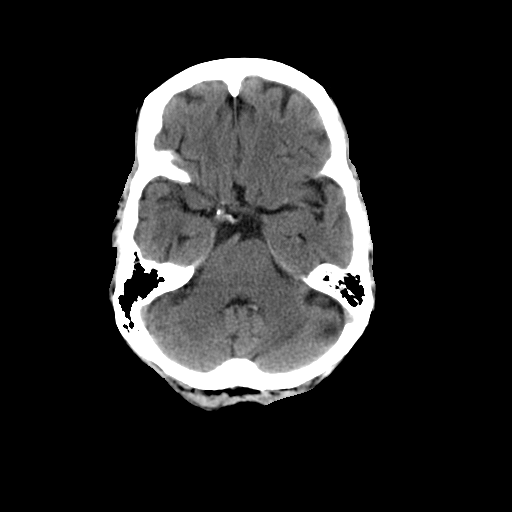
[im 9/28  brain]
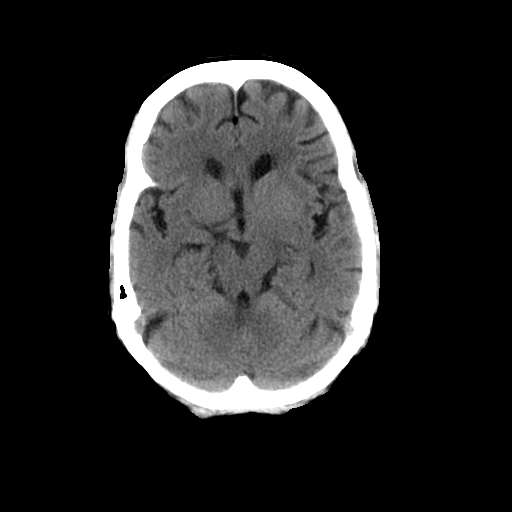
[im 9/28  bone]
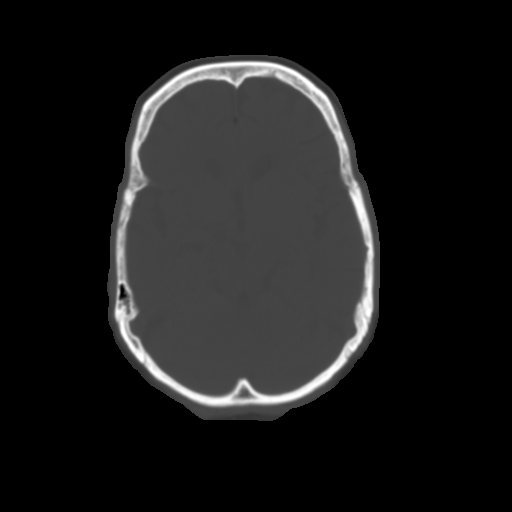
[im 10/28  brain]
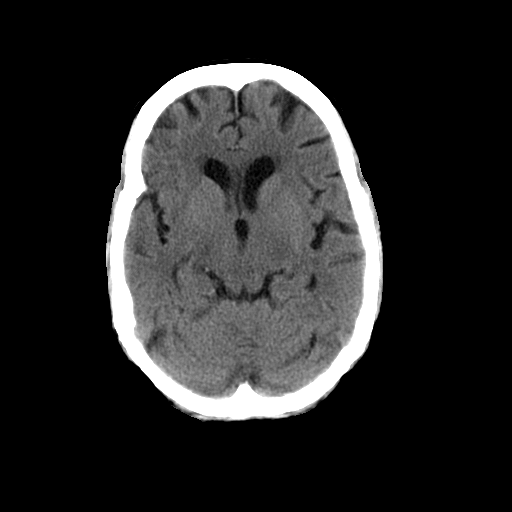
[im 12/28  brain]
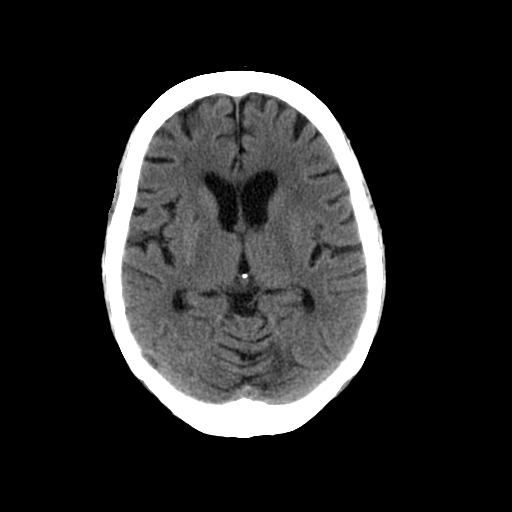
[im 14/28  brain]
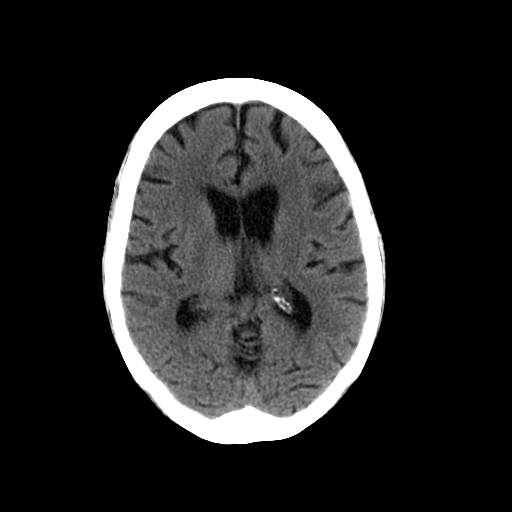
[im 15/28  brain]
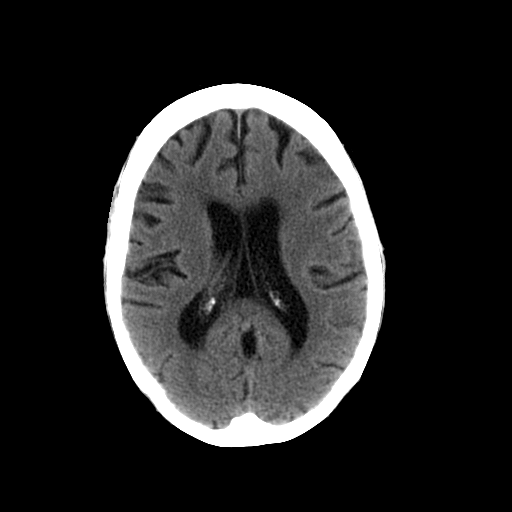
[im 15/28  bone]
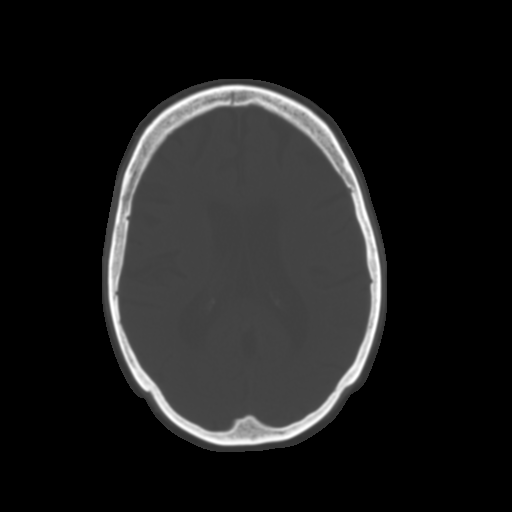
[im 17/28  brain]
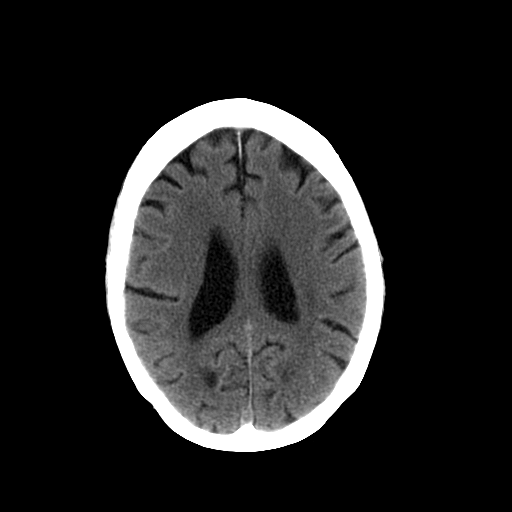
[im 19/28  brain]
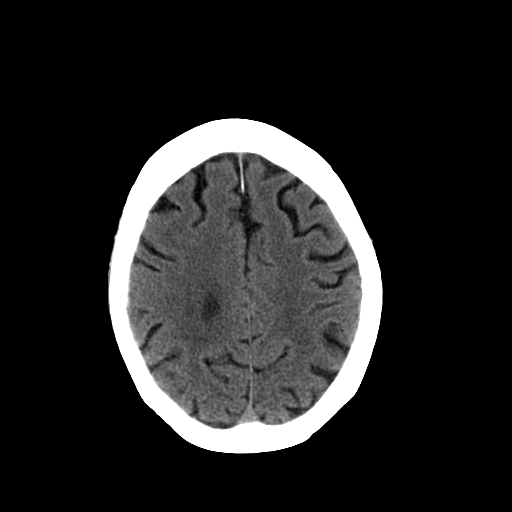
[im 20/28  brain]
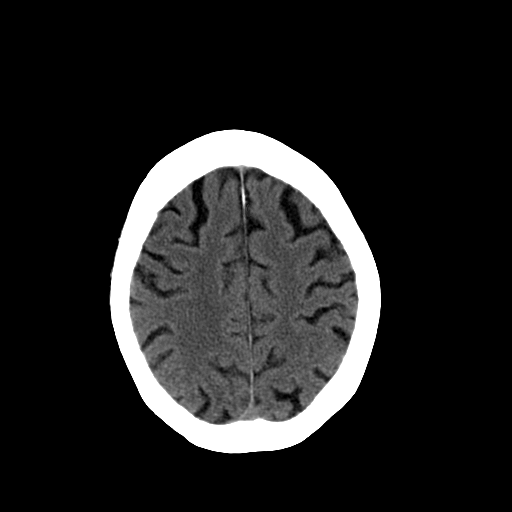
[im 22/28  brain]
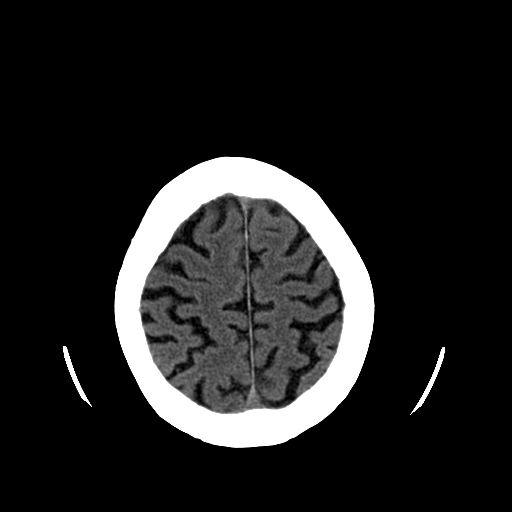
[im 22/28  bone]
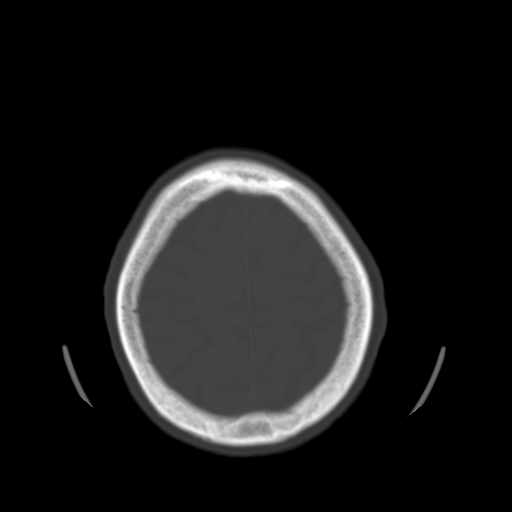
[im 23/28  brain]
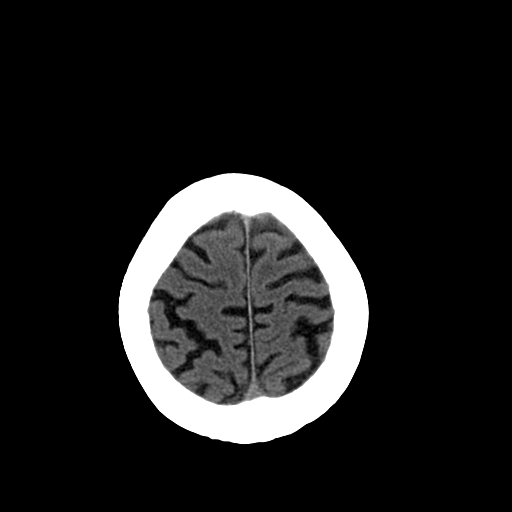
[im 25/28  brain]
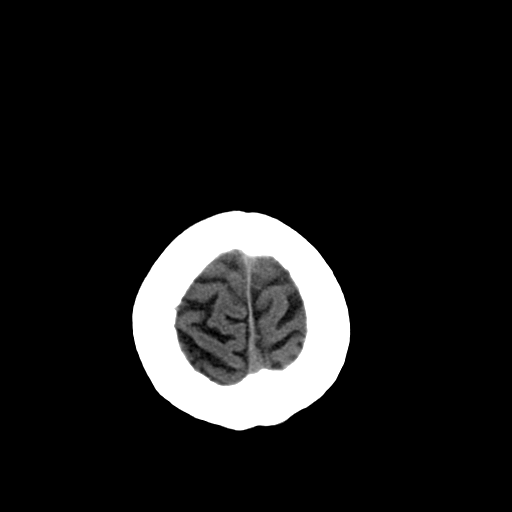
[im 27/28  brain]
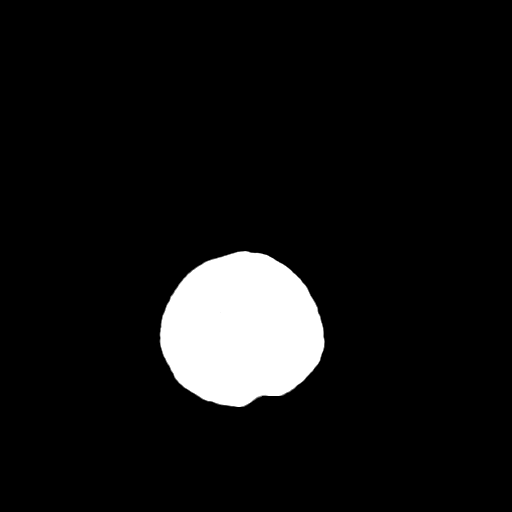

[16 of 28 positions shown; findings below may reference images not displayed]

FINDINGS: Lacunar infarct in the left thalamus is of uncertain chronicity but new since 6222.  Generalized cerebral and cerebellar atrophy are noted.  No evidence of mass or mass effect,   hydrocephalus, extraaxial fluid collection, midline shift, hemorrhage, or other infarct.
IMPRESSION: 1. New left thalamic lacune since 6222, of uncertain chronicity. 
2. Atrophy and remote right basal ganglia lacunes.

## 2004-12-14 ENCOUNTER — Emergency Department (HOSPITAL_COMMUNITY): Admission: EM | Admit: 2004-12-14 | Discharge: 2004-12-14 | Payer: Self-pay | Admitting: Emergency Medicine

## 2004-12-25 ENCOUNTER — Emergency Department (HOSPITAL_COMMUNITY): Admission: EM | Admit: 2004-12-25 | Discharge: 2004-12-25 | Payer: Self-pay | Admitting: Emergency Medicine

## 2005-01-03 ENCOUNTER — Ambulatory Visit: Payer: Self-pay | Admitting: Sports Medicine

## 2005-01-04 ENCOUNTER — Ambulatory Visit (HOSPITAL_COMMUNITY): Admission: RE | Admit: 2005-01-04 | Discharge: 2005-01-04 | Payer: Self-pay | Admitting: Gastroenterology

## 2005-01-18 ENCOUNTER — Ambulatory Visit: Payer: Self-pay | Admitting: Family Medicine

## 2005-02-04 IMAGING — CR DG PELVIS 1-2V
1 series · 1 of 1 positions shown · non-contrast
Comparison: Previous study of 03/01/04.

CLINICAL DATA: 81-year-old with bilateral hip pain. 
 PELVIS - 1 VIEW:

[view not recorded]
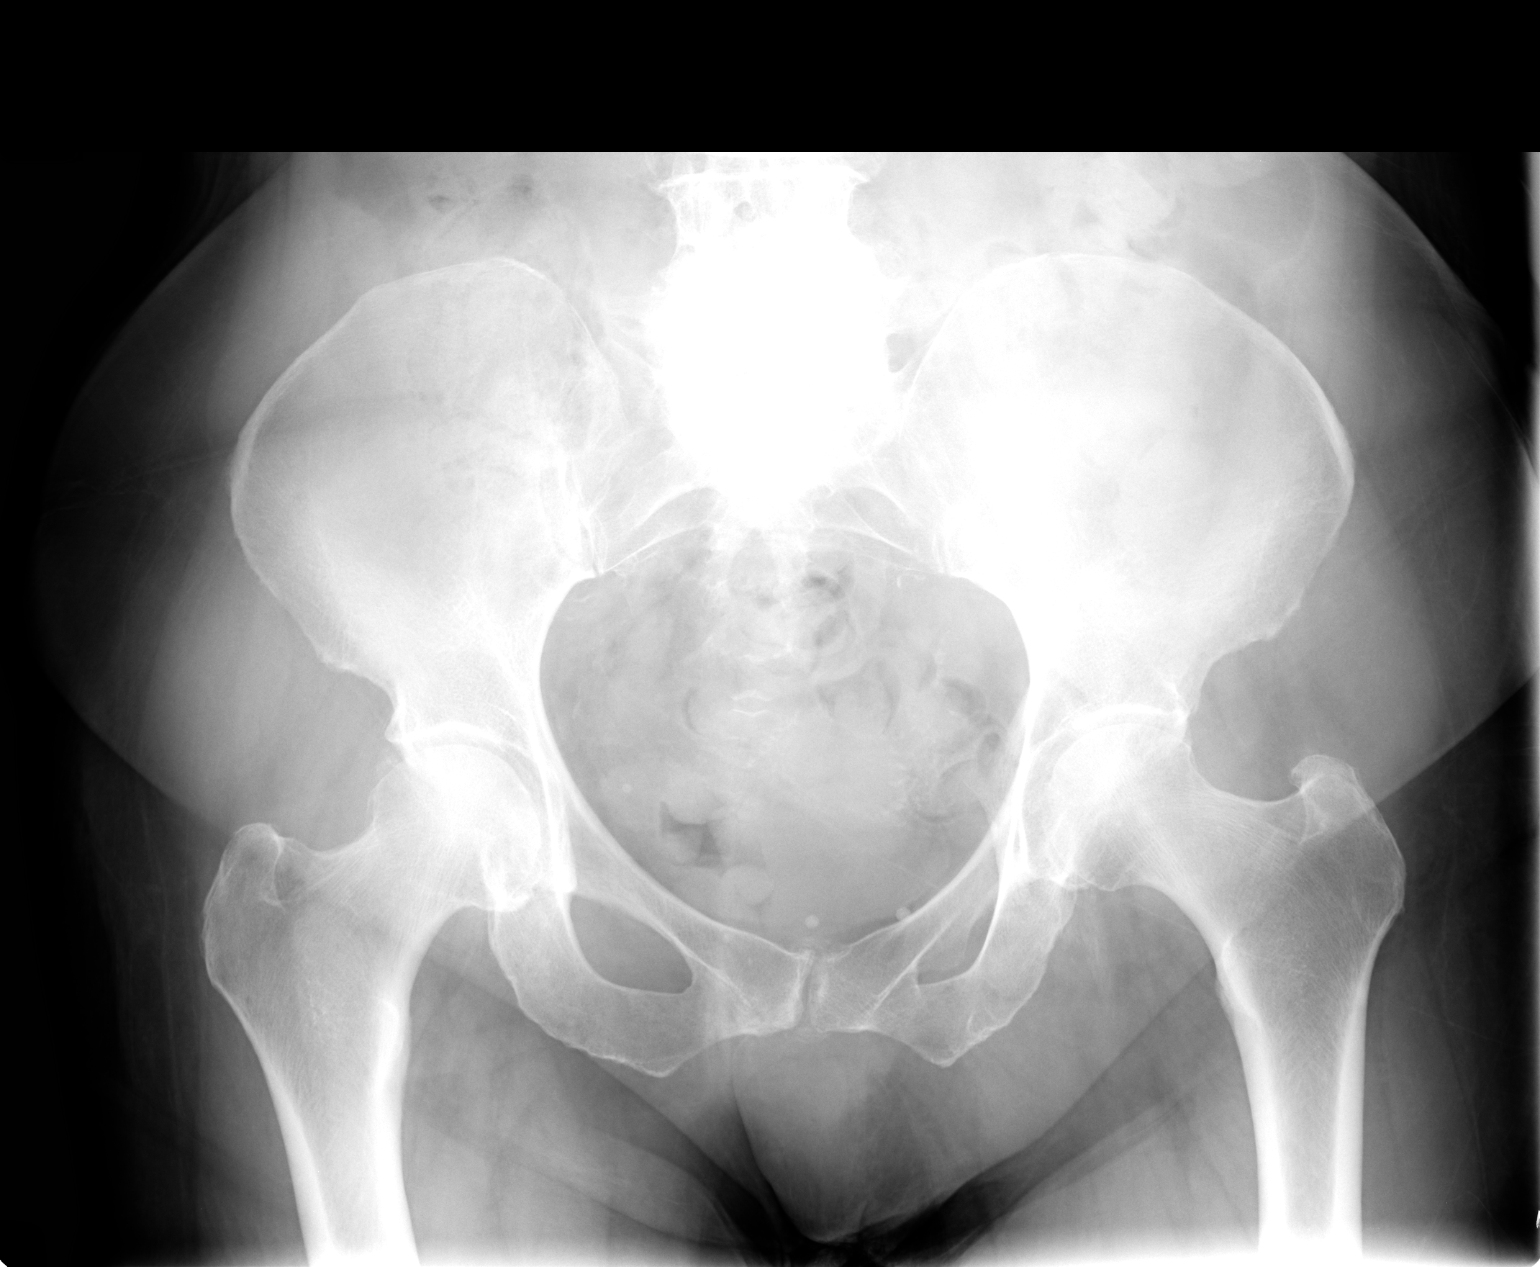

[1 of 1 positions shown; findings below may reference images not displayed]

Single AP of the pelvis reveals both hips located with mild stable degenerative changes.  Bony density near the right superior acetabulum rim may be an uninfused ossification center.  There are degenerative changes of the pubic symphysis.  No definite fractures are seen.  Advanced degenerative changes noted of the lower lumbar spine.
IMPRESSION: Mild degenerative changes with no acute bony findings and no change since prior study.

## 2005-02-04 IMAGING — CR DG LUMBAR SPINE COMPLETE 4+V
5 series · 5 of 5 positions shown · non-contrast
Comparison: none

CLINICAL DATA: Low back pain. 
LUMBAR SPINE - FOUR VIEW:
Osteopenia is noted.  A compression deformity of the superior end plate of the L2 vertebral body is seen which is likely chronic.  No definite acute fracture is seen.  Degenerative disk disease is seen at all lumbar levels.  Facet DJD is also seen mostly at the levels of L4-5 and L5-S1.  There is mild retrolisthesis at L4-5 measuring approximately 5 mm, which is attributed to the degenerative changes noted at this level.  
Small accessory ribs are noted at the level of L1.

[view not recorded (1 of 5)]
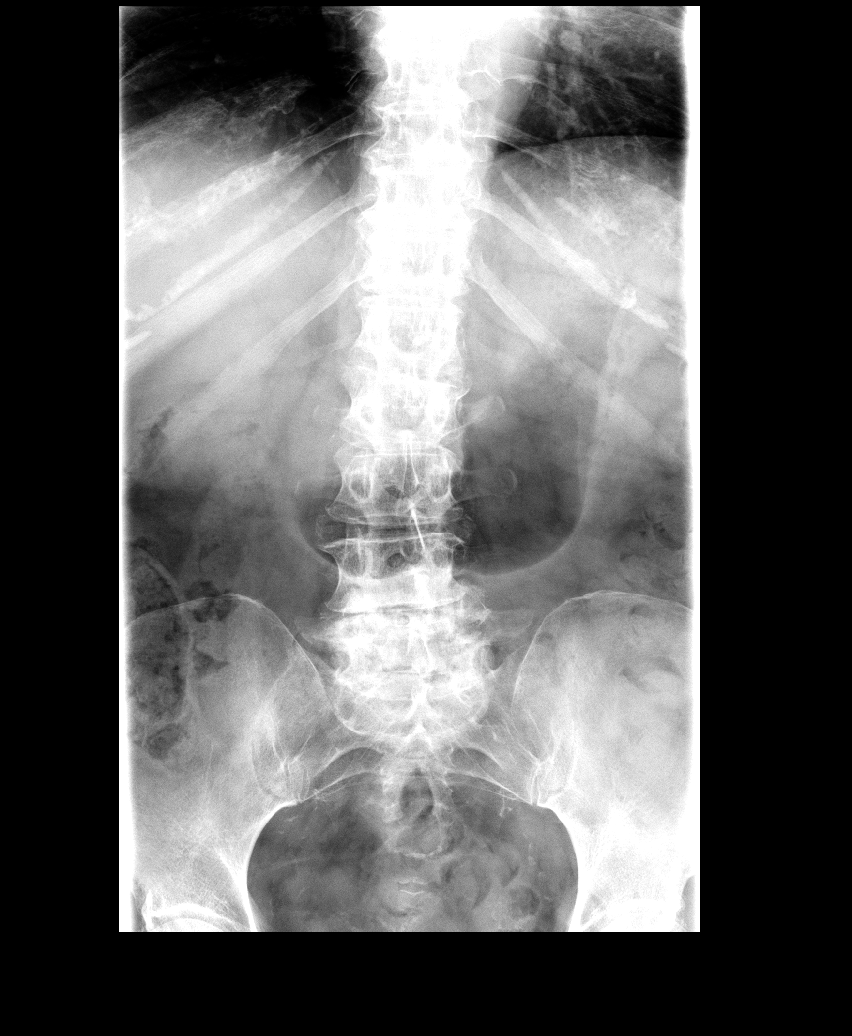

[view not recorded (2 of 5)]
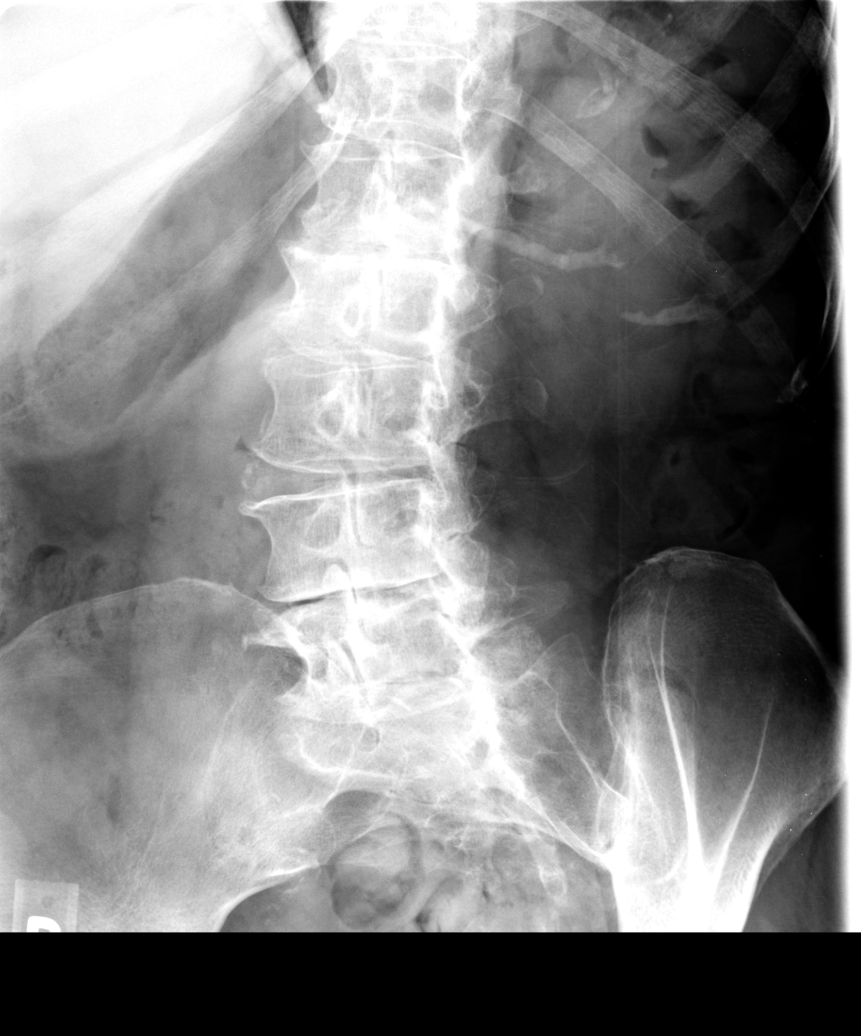

[view not recorded (3 of 5)]
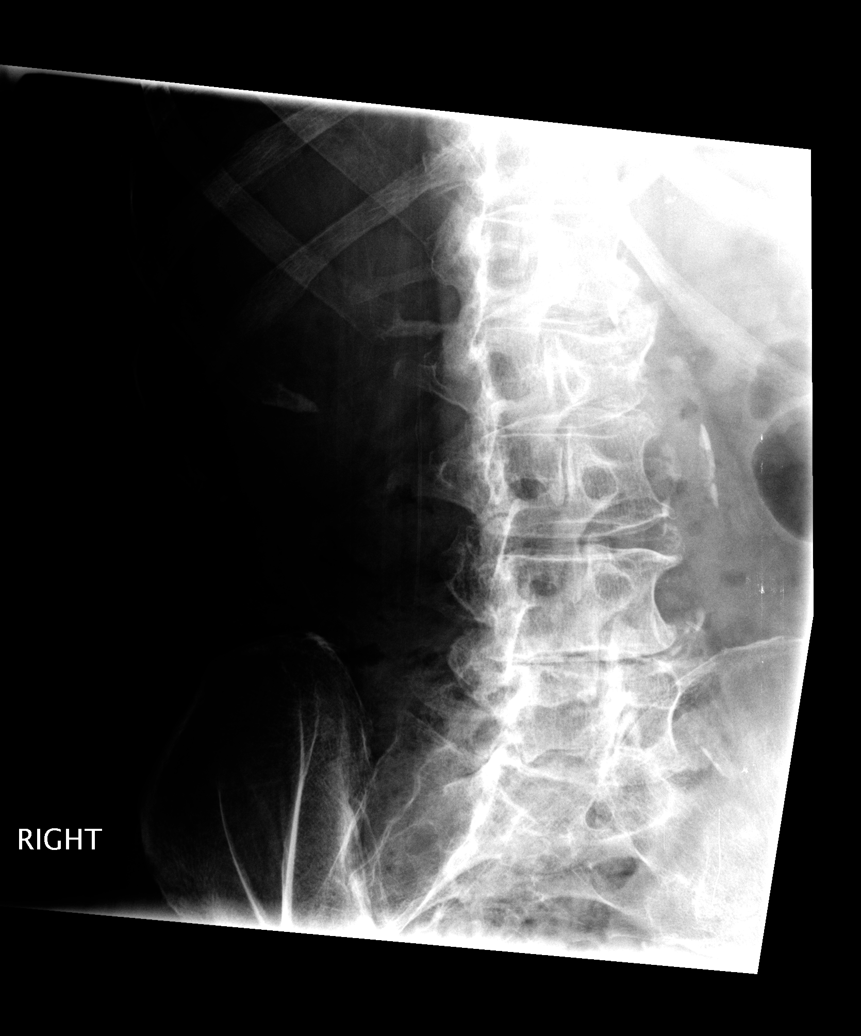

[view not recorded (4 of 5)]
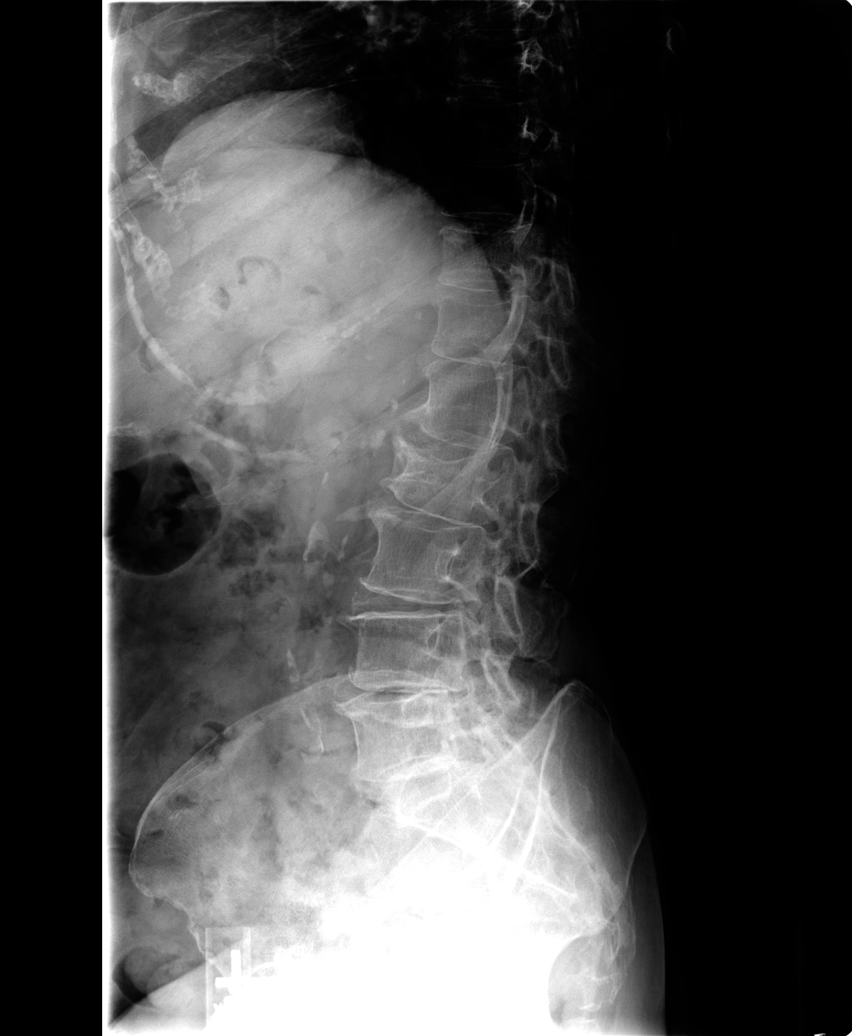

[view not recorded (5 of 5)]
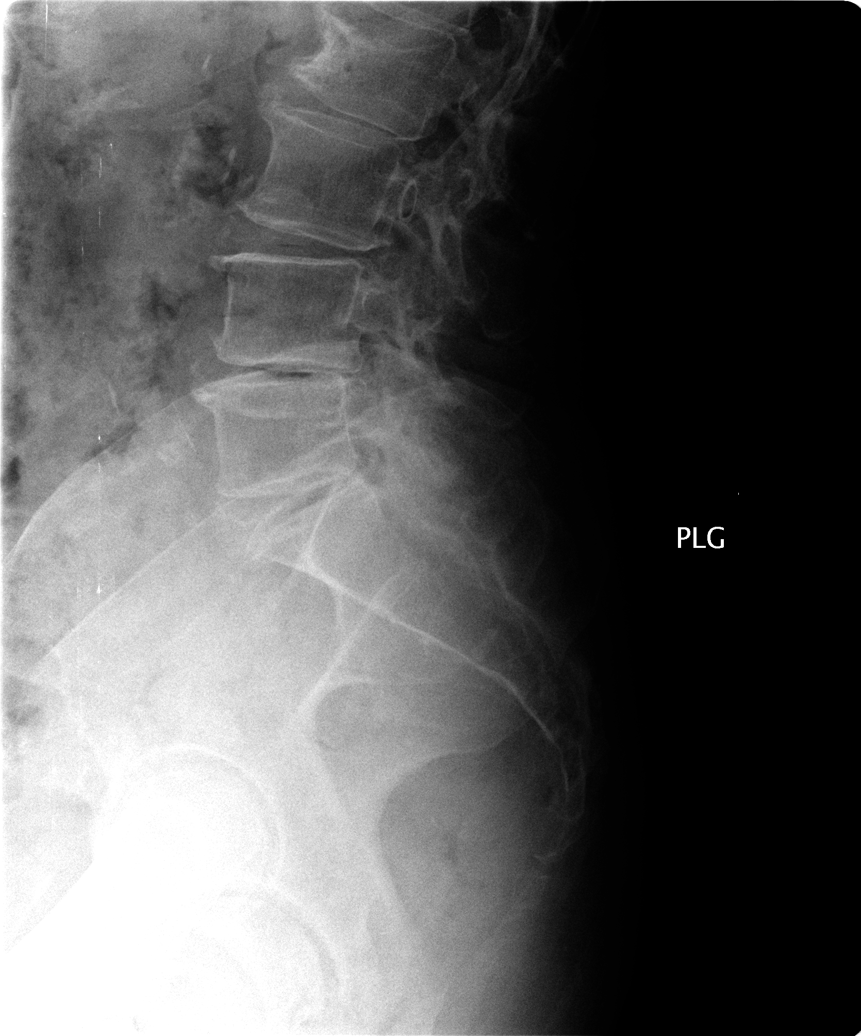

[5 of 5 positions shown; findings below may reference images not displayed]

IMPRESSION: 1. Osteopenic compression fracture of the superior endplate of the L2 vertebral body, which is likely old.  
2. Multilevel degenerative disk disease and lower lumbar facet DJD.  
3. Mild degenerative retrolisthesis at L4-5 measuring approximately 5 mm.

## 2005-02-15 IMAGING — CR DG CHEST 1V PORT
1 series · 1 of 1 positions shown · non-contrast
Comparison: 05/27/2004.

CLINICAL DATA: Chest pain and nausea.

PORTABLE CHEST - 1 VIEW

[view not recorded]
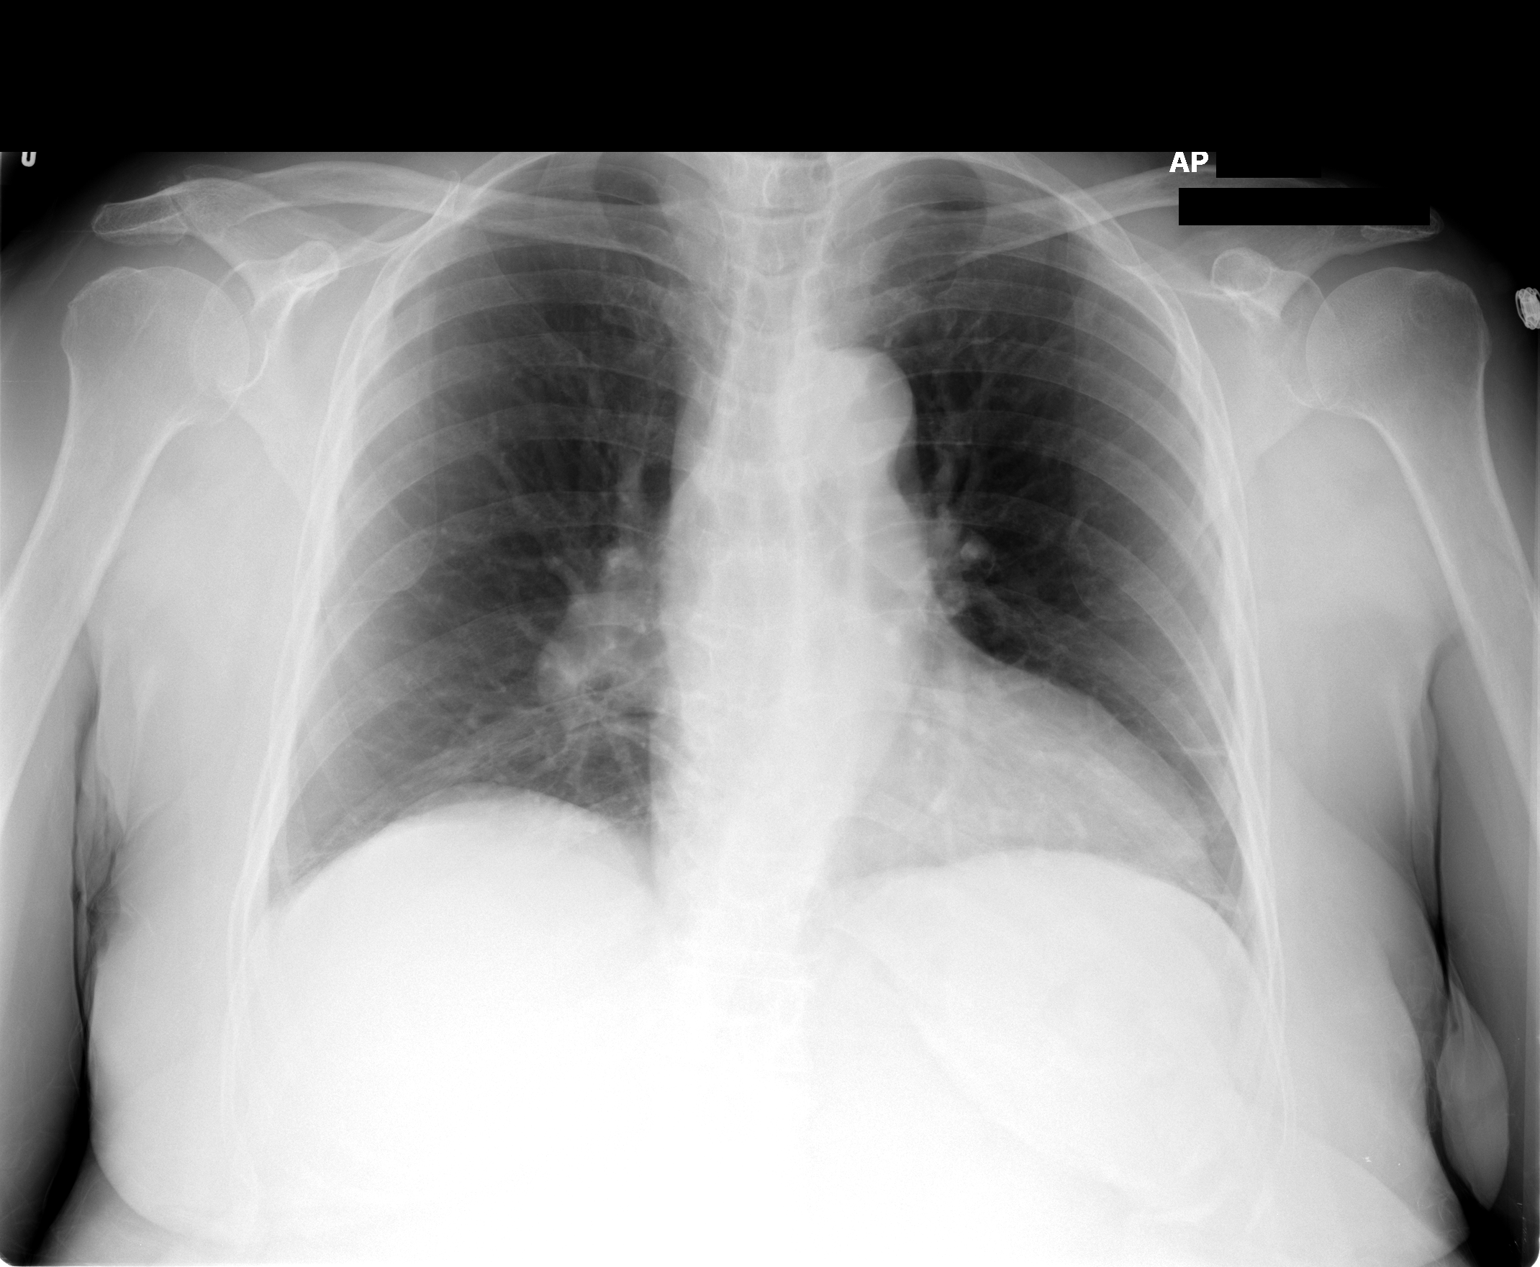

[1 of 1 positions shown; findings below may reference images not displayed]

FINDINGS: Stable borderline enlarged cardiac silhouette with a mildly prominent
left ventricular contour. The aorta remains tortuous and partially calcified.
Stable mild bibasilar linear scar formation. Otherwise, clear lungs. Diffuse
osteopenia.  

IMPRESSION

1. Stable borderline cardiomegaly with probable left ventricular hypertrophy.

2. Stable minimal bibasilar scarring.

## 2005-02-15 IMAGING — RF DG ESOPHAGUS
8 series · 8 of 8 positions shown · non-contrast
Comparison: none

CLINICAL DATA: 81-year-old with chest pain, dysphagia, difficulty swallowing. 
 BARIUM ESOPHAGRAM:
 Barium esophagram was performed and demonstrates two areas of strictured narrowing involving the distal esophagus.  There is an area of stricturing above the esophageal vestibule which is fairly tight but has smooth tapered margin and is probably a benign reflux type stricture.   There is also a stricture at the GE junction just above a small hiatal hernia.  This also has smooth margins but appears to be more like an apple-core type lesion.    Could not exclude a distal esophageal cancer.  Recommend endoscopic evaluation.   Small hiatal hernia is noted.  Esophageal dysmotility.

[Series 1: run · 1 of 1 slices shown (1 of 8)]
[im 1/1]
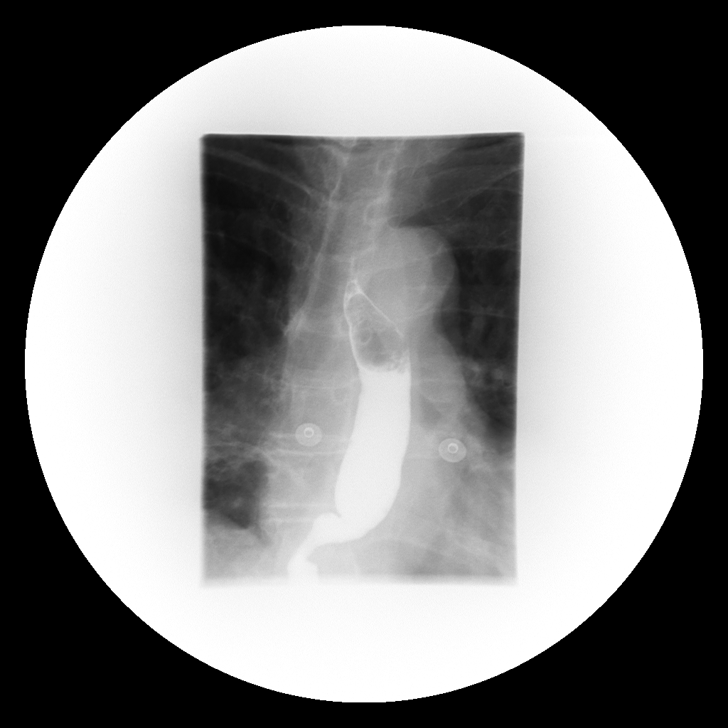

[Series 2: run · 1 of 1 slices shown (2 of 8)]
[im 1/1]
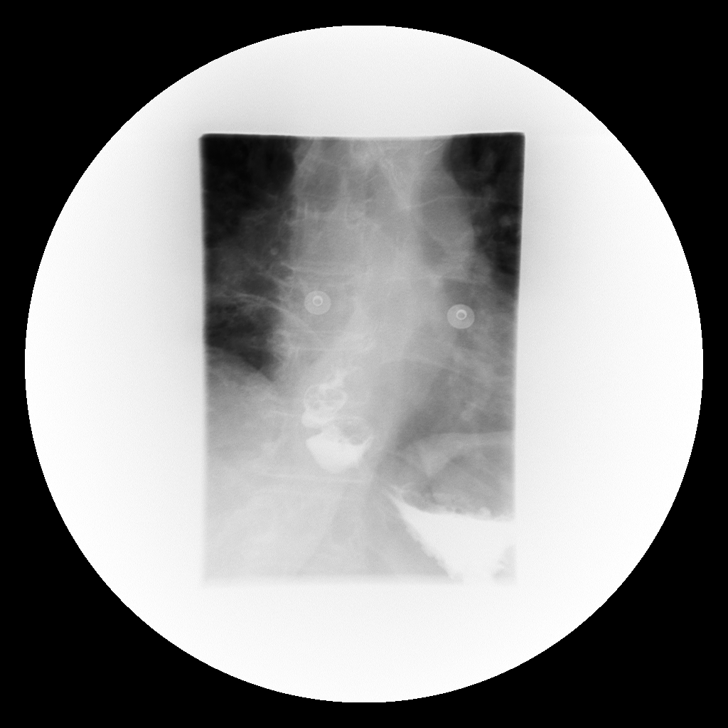

[Series 3: run · 1 of 1 slices shown (3 of 8)]
[im 1/1]
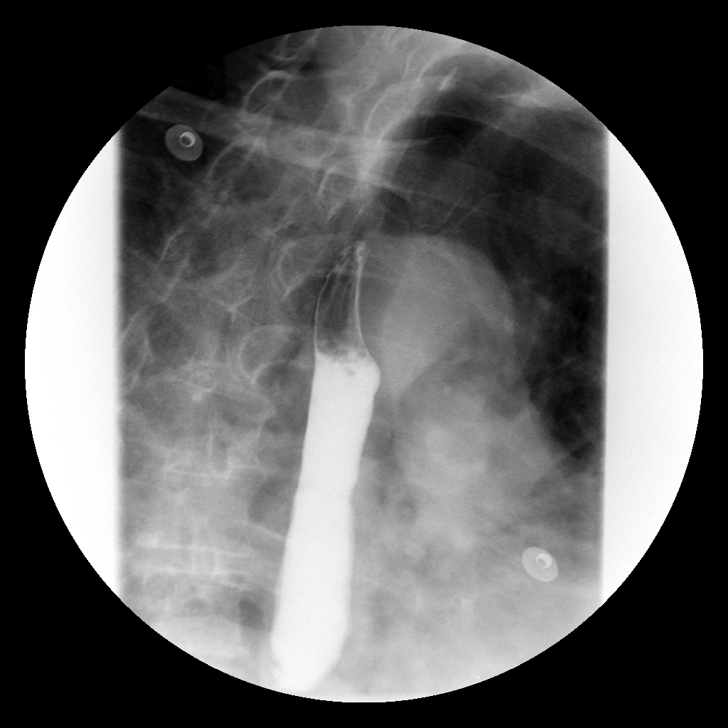

[Series 4: run · 1 of 1 slices shown (4 of 8)]
[im 1/1]
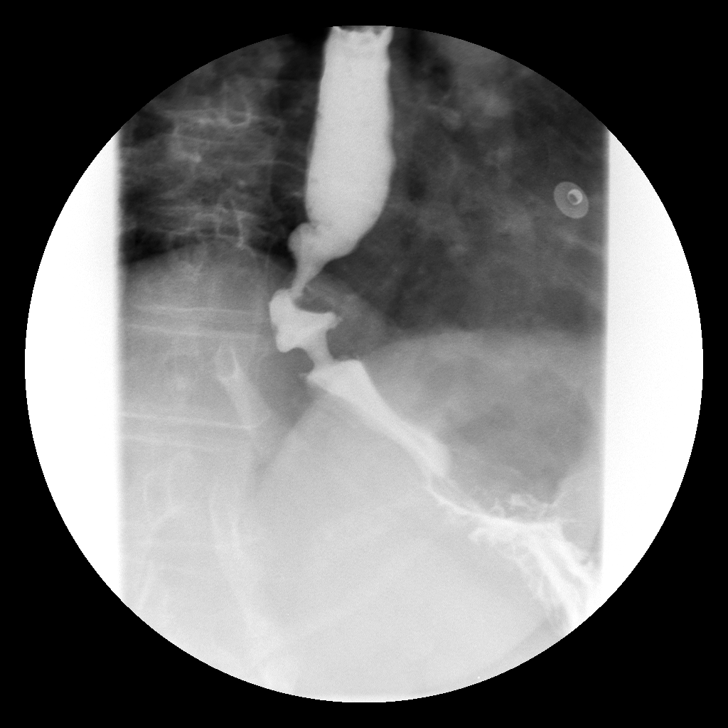

[Series 5: run · 1 of 1 slices shown (5 of 8)]
[im 1/1]
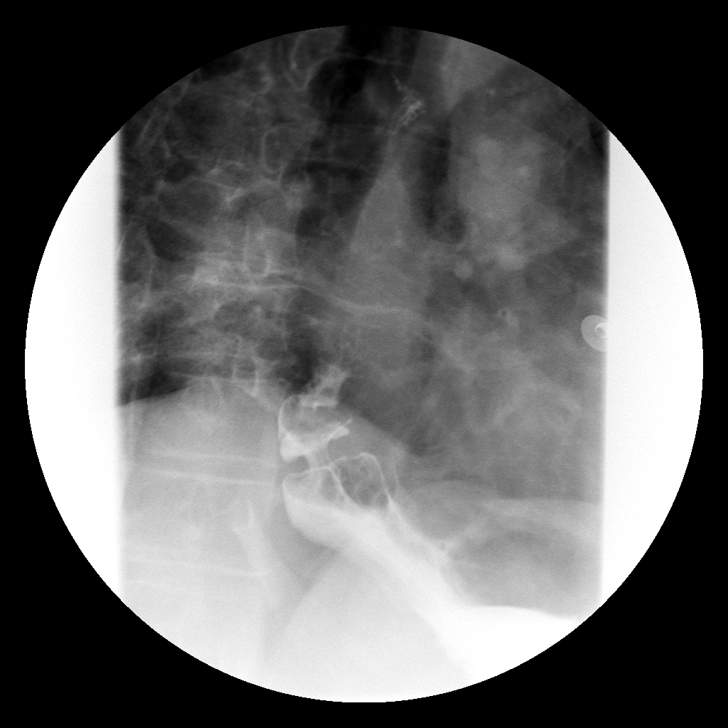

[Series 6: run · 1 of 1 slices shown (6 of 8)]
[im 1/1]
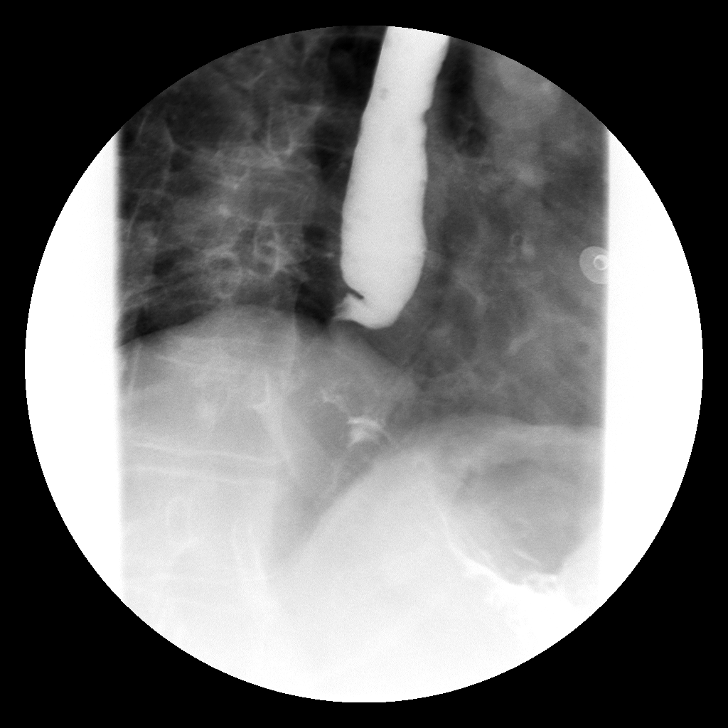

[Series 7: run · 1 of 1 slices shown (7 of 8)]
[im 1/1]
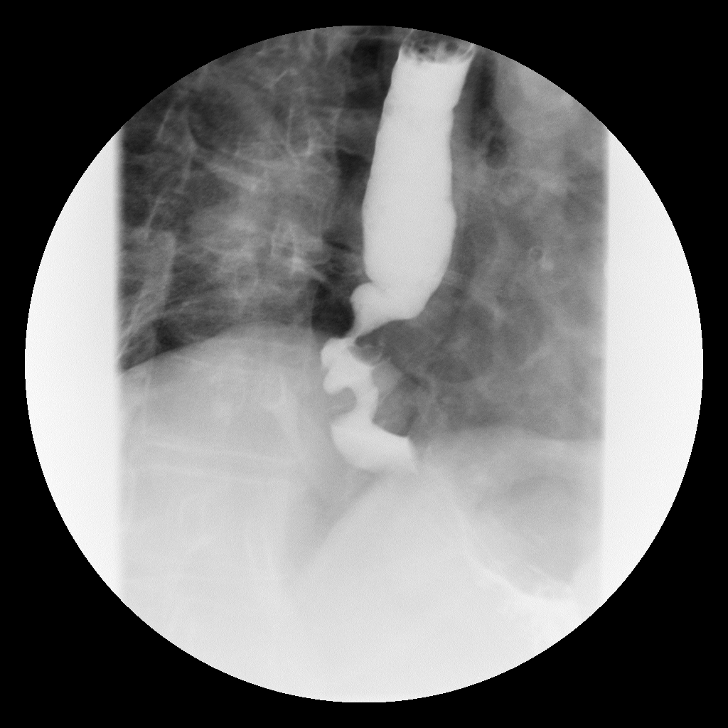

[Series 8: run · 1 of 1 slices shown (8 of 8)]
[im 1/1]
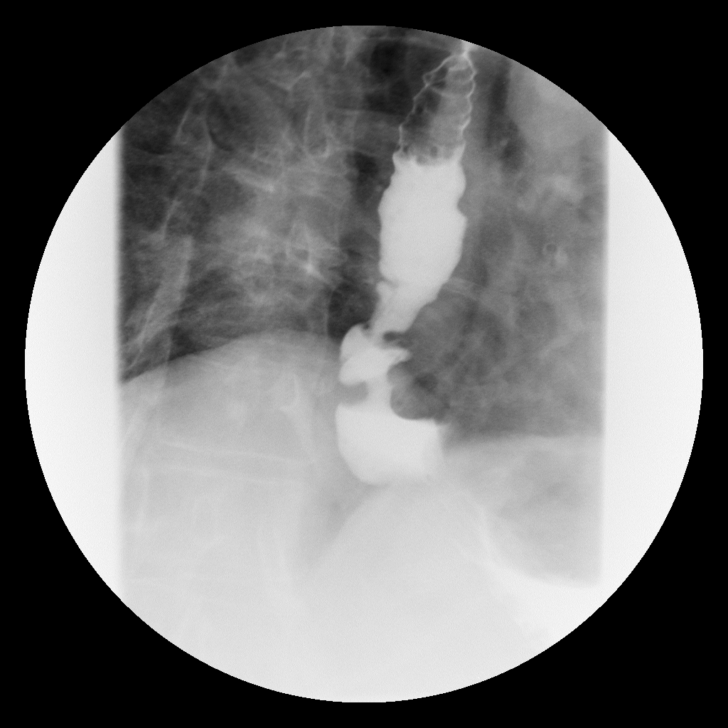

[8 of 8 positions shown; findings below may reference images not displayed]

IMPRESSION: 1.  Two areas of strictured narrowing involving the distal esophagus.  Both of these are fairly tight and narrow.   Could not exclude a distal esophageal cancer particularly at the distal stricture.  
 2.  Small hiatal hernia. 
 3.  Esophageal dysmotility.

## 2005-03-02 ENCOUNTER — Encounter: Admission: RE | Admit: 2005-03-02 | Discharge: 2005-03-02 | Payer: Self-pay | Admitting: Sports Medicine

## 2005-05-30 ENCOUNTER — Ambulatory Visit: Payer: Self-pay | Admitting: Family Medicine

## 2005-07-10 ENCOUNTER — Ambulatory Visit: Payer: Self-pay | Admitting: Family Medicine

## 2005-07-11 ENCOUNTER — Inpatient Hospital Stay (HOSPITAL_COMMUNITY): Admission: EM | Admit: 2005-07-11 | Discharge: 2005-07-12 | Payer: Self-pay | Admitting: Emergency Medicine

## 2005-07-11 ENCOUNTER — Ambulatory Visit: Payer: Self-pay | Admitting: Sports Medicine

## 2005-08-11 ENCOUNTER — Ambulatory Visit: Payer: Self-pay | Admitting: Family Medicine

## 2005-08-14 ENCOUNTER — Encounter: Admission: RE | Admit: 2005-08-14 | Discharge: 2005-08-14 | Payer: Self-pay | Admitting: Sports Medicine

## 2005-08-17 ENCOUNTER — Ambulatory Visit: Payer: Self-pay | Admitting: Family Medicine

## 2005-08-31 ENCOUNTER — Ambulatory Visit: Payer: Self-pay | Admitting: Family Medicine

## 2005-09-01 IMAGING — CR DG CHEST 2V
2 series · 2 of 2 positions shown · non-contrast
Comparison: 12/25/2004

CLINICAL DATA: Vomiting

CHEST - 2 VIEW:

[view not recorded (1 of 2)]
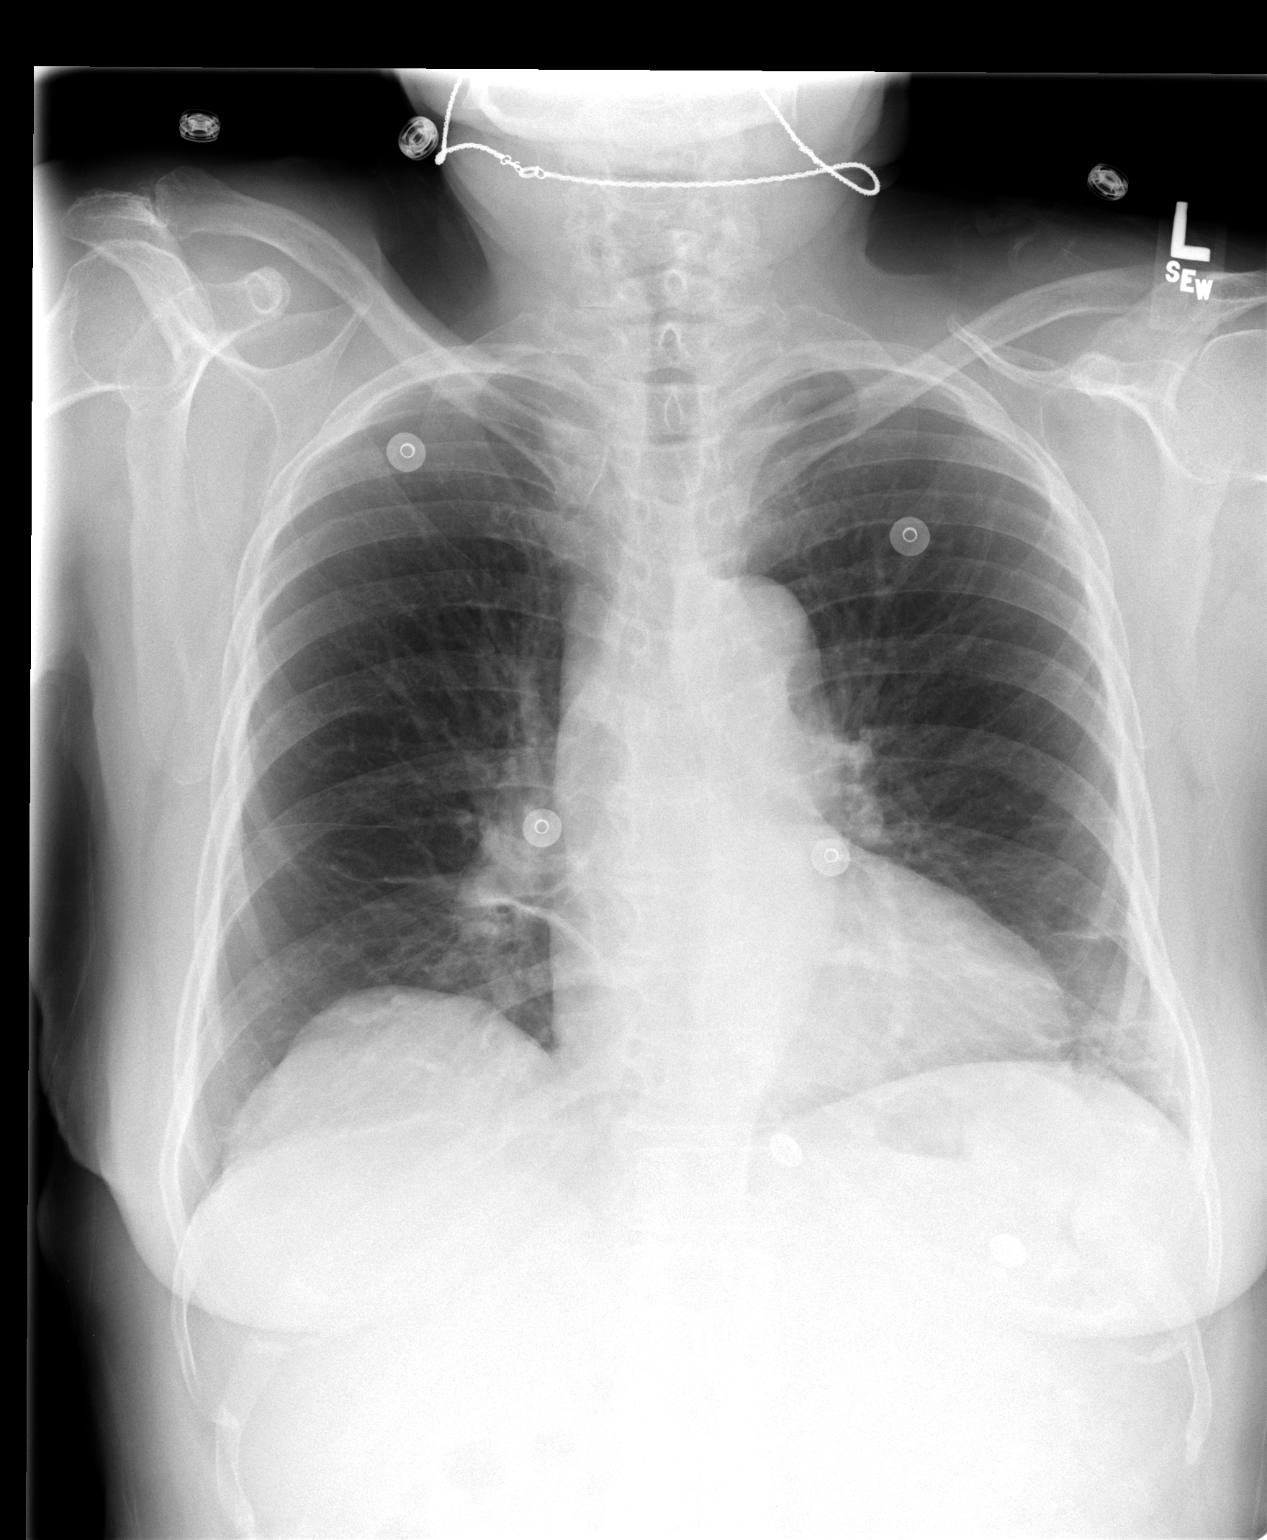

[view not recorded (2 of 2)]
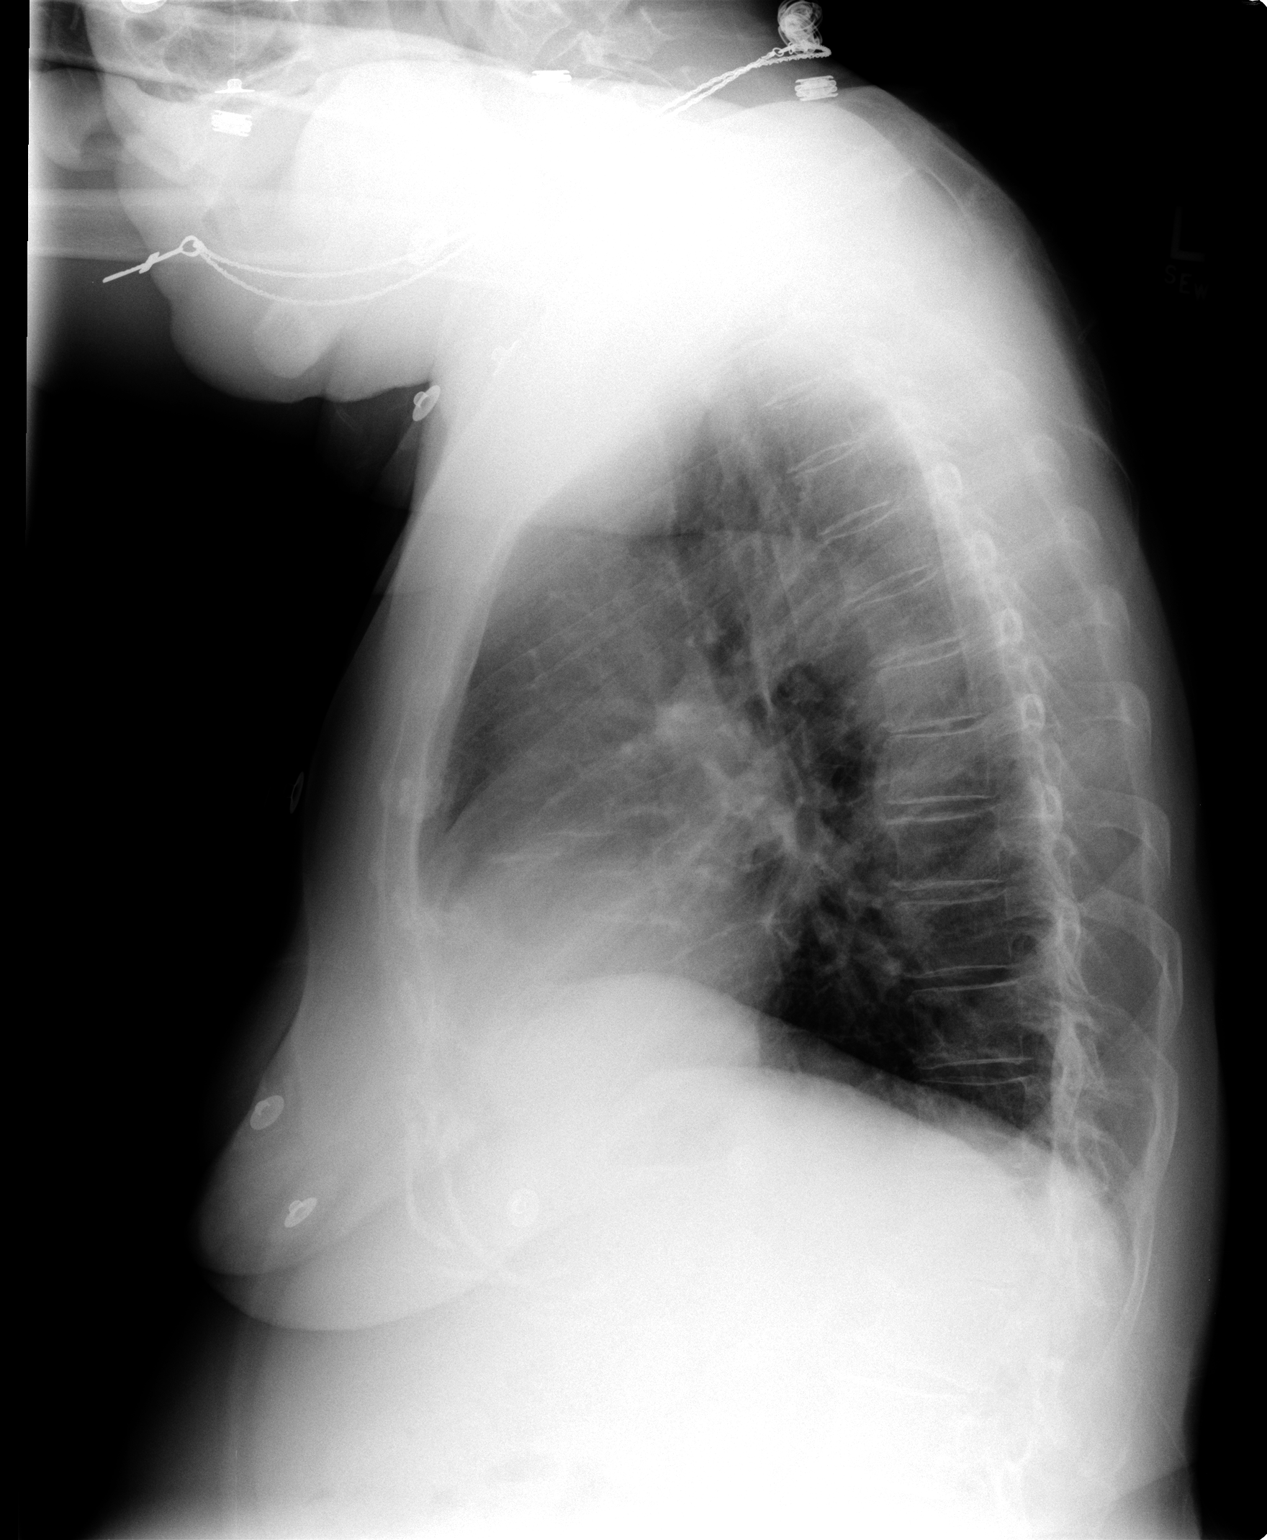

[2 of 2 positions shown; findings below may reference images not displayed]

FINDINGS: Heart is borderline in size. They're linear densities in both lung
bases, atelectasis versus scarring. No effusions. Visualized skeleton
unremarkable.
IMPRESSION: Borderline heart size. Bibasilar atelectasis or scarring.

## 2005-10-20 ENCOUNTER — Ambulatory Visit: Payer: Self-pay | Admitting: Family Medicine

## 2005-11-13 HISTORY — PX: OTHER SURGICAL HISTORY: SHX169

## 2006-01-06 ENCOUNTER — Emergency Department (HOSPITAL_COMMUNITY): Admission: EM | Admit: 2006-01-06 | Discharge: 2006-01-06 | Payer: Self-pay | Admitting: Emergency Medicine

## 2006-02-07 ENCOUNTER — Ambulatory Visit: Payer: Self-pay | Admitting: Family Medicine

## 2006-02-15 ENCOUNTER — Encounter: Admission: RE | Admit: 2006-02-15 | Discharge: 2006-02-15 | Payer: Self-pay | Admitting: Sports Medicine

## 2006-02-19 ENCOUNTER — Ambulatory Visit: Payer: Self-pay | Admitting: Family Medicine

## 2006-02-27 IMAGING — CT CT HEAD W/O CM
1 of 2 series · 13 of 30 positions shown, 17 images · non-contrast
Comparison: 10/20/04.

CLINICAL DATA: Patient with facial droop and slurred speech, sudden onset.
 HEAD CT WITHOUT CONTRAST ? 01/06/06:
TECHNIQUE: Contiguous axial CT images were obtained from the base of the skull through the vertex according to standard protocol without contrast.

[Series 2: brain · axial · 0.47mm/px · z∈[+102,+221]mm · 13 of 28 slices shown, 17 images]
[im 2/28  brain]
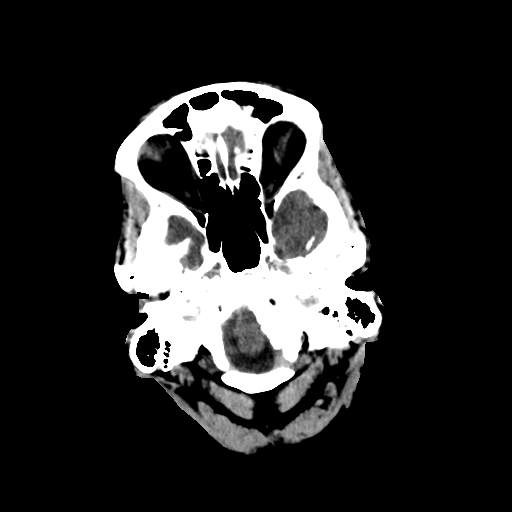
[im 2/28  bone]
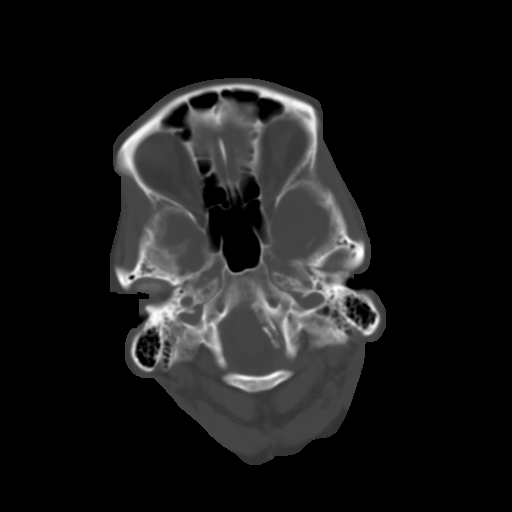
[im 4/28  brain]
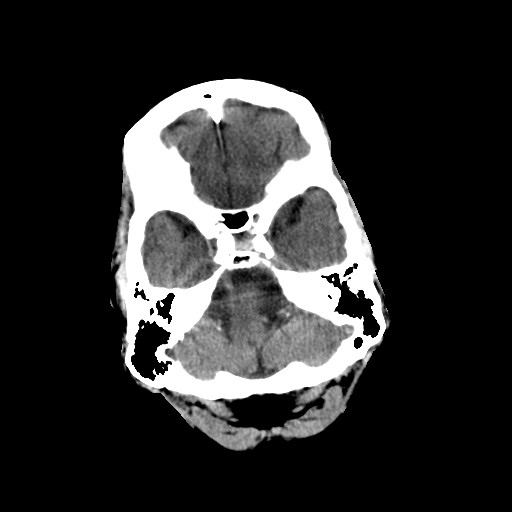
[im 6/28  brain]
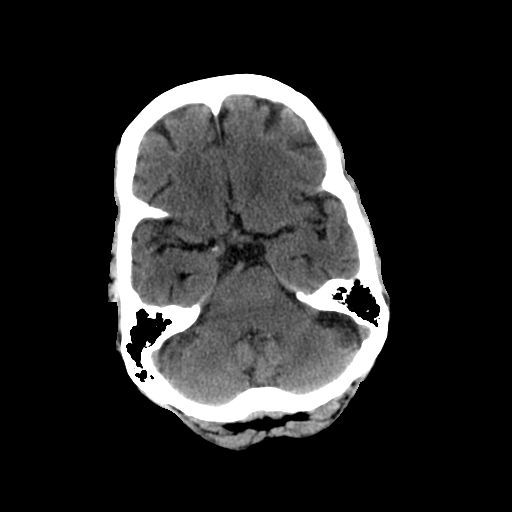
[im 8/28  brain]
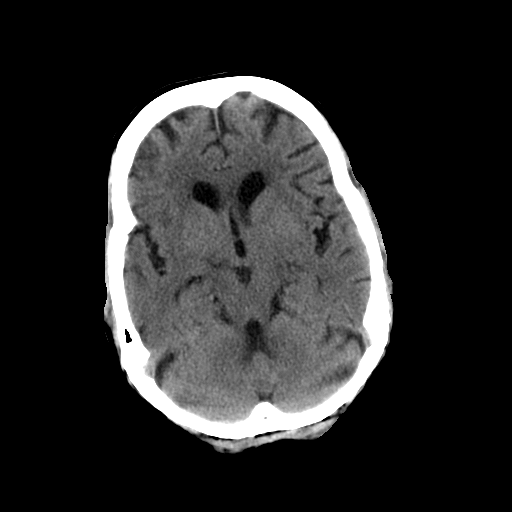
[im 10/28  brain]
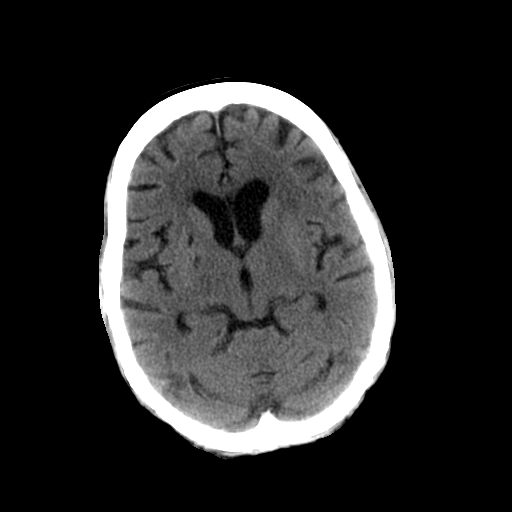
[im 10/28  bone]
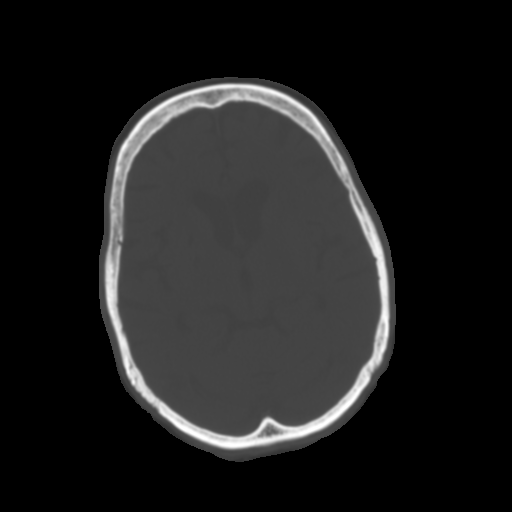
[im 12/28  brain]
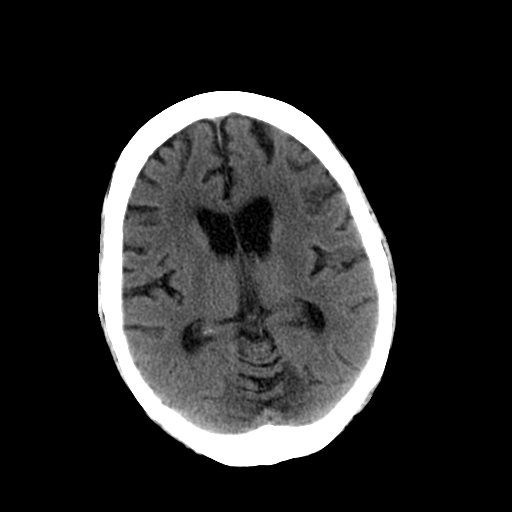
[im 14/28  brain]
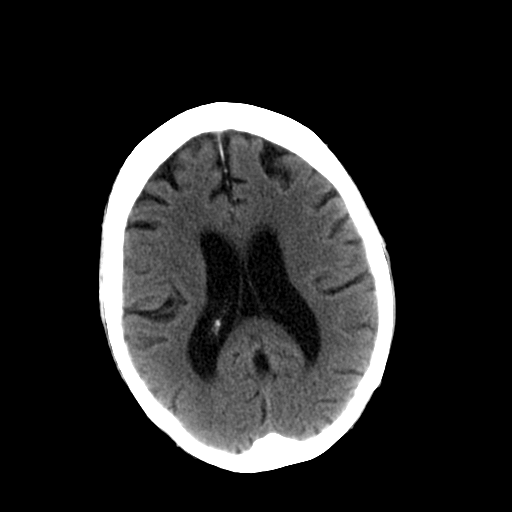
[im 16/28  brain]
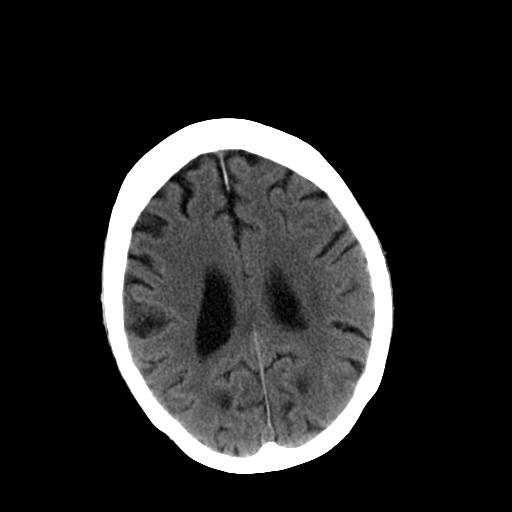
[im 18/28  brain]
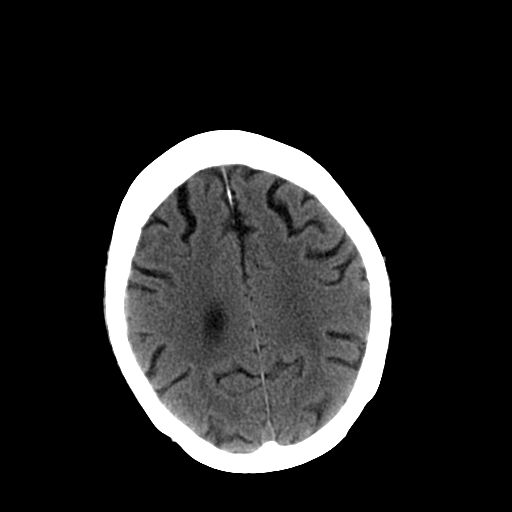
[im 18/28  bone]
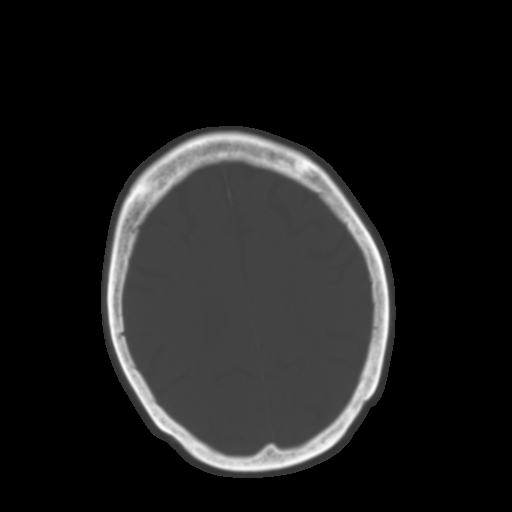
[im 20/28  brain]
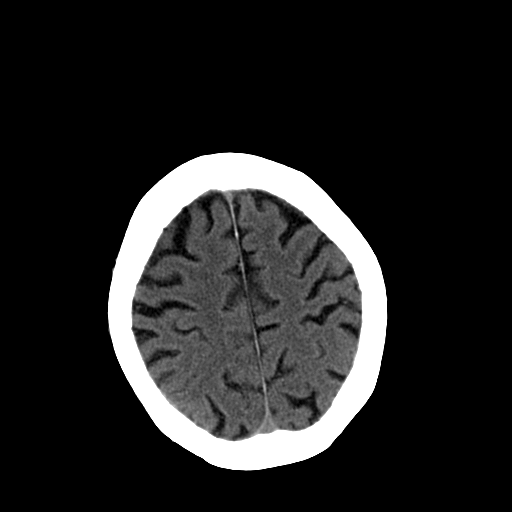
[im 22/28  brain]
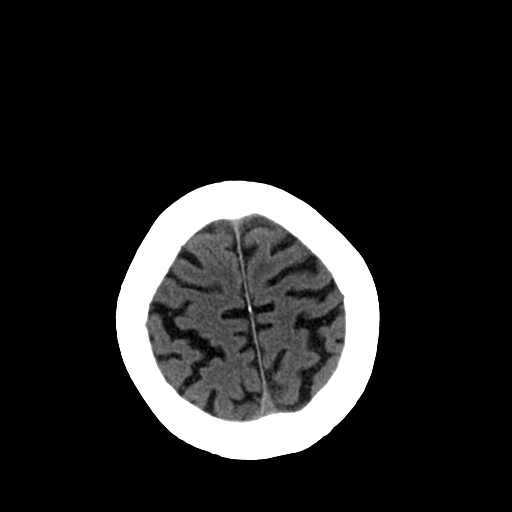
[im 24/28  brain]
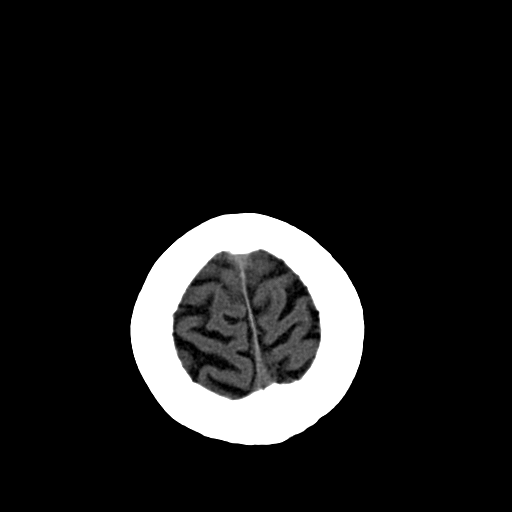
[im 26/28  brain]
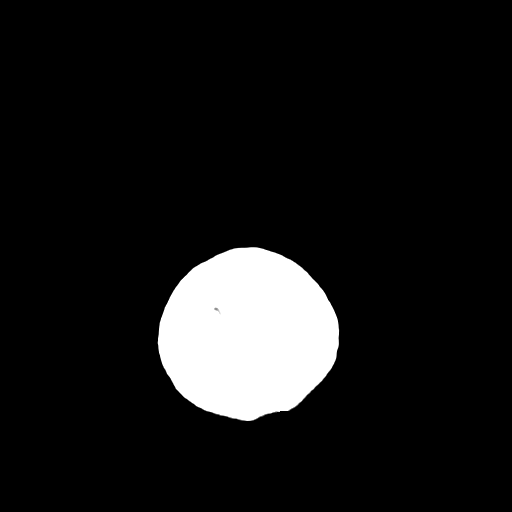
[im 26/28  bone]
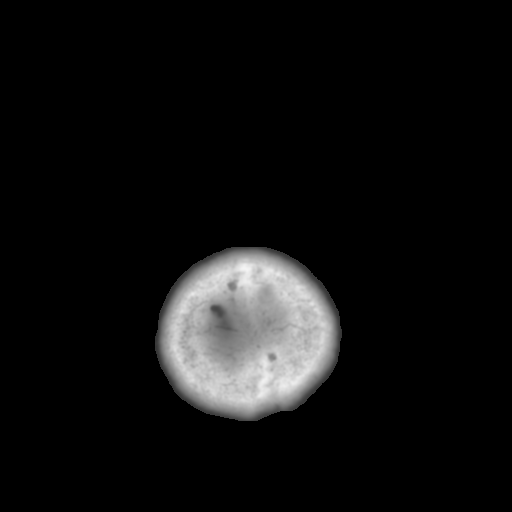

[13 of 30 positions shown; findings below may reference images not displayed]

FINDINGS: Axial scans from the base to the vertex were performed in the unenhanced state.   There is little change in ventricular size and diffuse atrophy.  Small right lacunar infarcts are noted.  No acute intracranial abnormality is seen.  No mass effect is noted.  No bony abnormality is noted.
IMPRESSION: Atrophy.  No acute intracranial abnormality.

## 2006-03-05 ENCOUNTER — Encounter: Admission: RE | Admit: 2006-03-05 | Discharge: 2006-03-05 | Payer: Self-pay | Admitting: Sports Medicine

## 2006-03-22 ENCOUNTER — Ambulatory Visit: Payer: Self-pay | Admitting: Family Medicine

## 2006-04-08 IMAGING — MR MR [PERSON_NAME] LOW W/O CM*L*
8 of 10 series · 33 of 40 positions shown · IV contrast (agent unspecified)
Comparison: none

CLINICAL DATA: Dorsal foot and ankle pain and swelling for approximately 1 year after slipping on a rock.  Question stress fracture. 
 MRI LEFT FOOT WITHOUT CONTRAST:
TECHNIQUE: Multiplanar, multisequence MR imaging of the foot was performed following the standard protocol.  No intravenous contrast was administered.

[Series 4: T1 fat-sat · coronal · 6.0mm · 0.39mm/px · 6 of 32 slices shown]
[im 1/32]
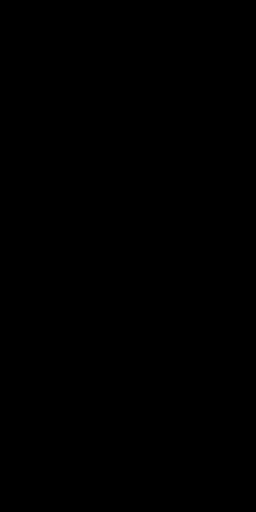
[im 7/32]
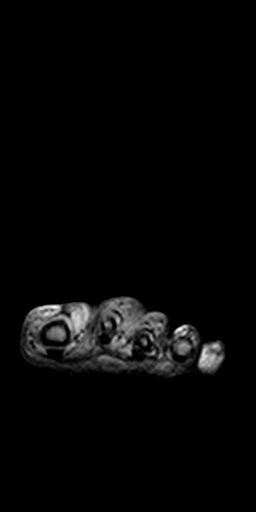
[im 13/32]
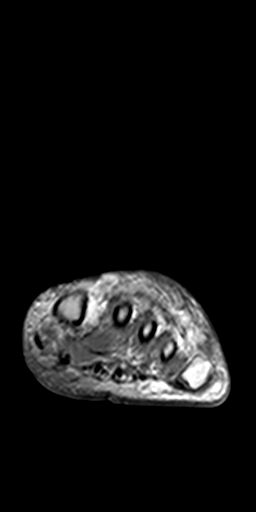
[im 19/32]
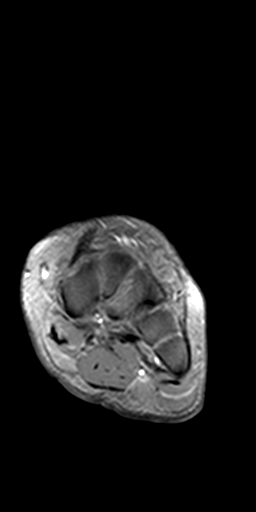
[im 25/32]
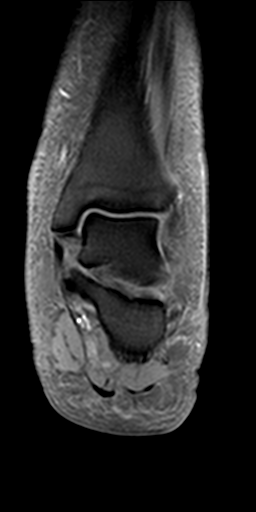
[im 32/32]
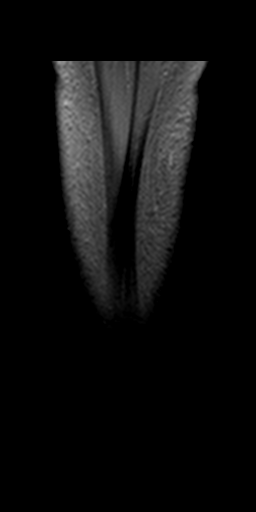

[Series 5: T1 · coronal · 6.0mm · 0.39mm/px · 6 of 32 slices shown (1 of 3)]
[im 1/32]
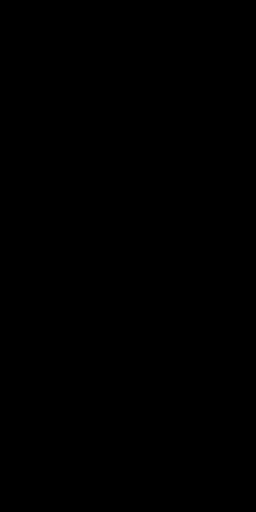
[im 7/32]
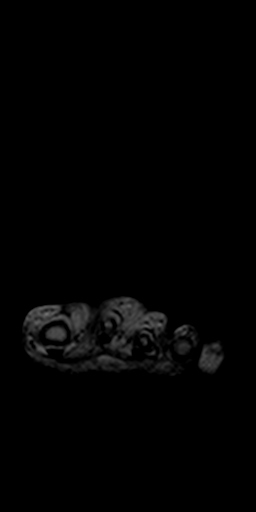
[im 13/32]
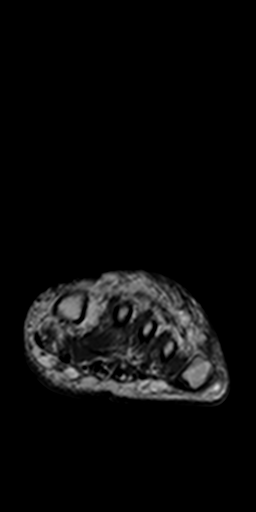
[im 19/32]
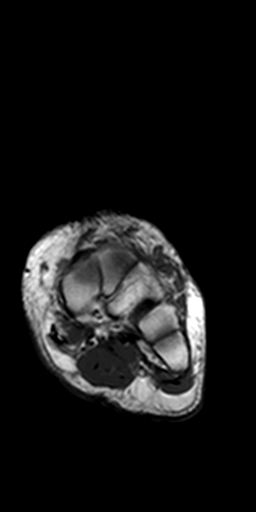
[im 25/32]
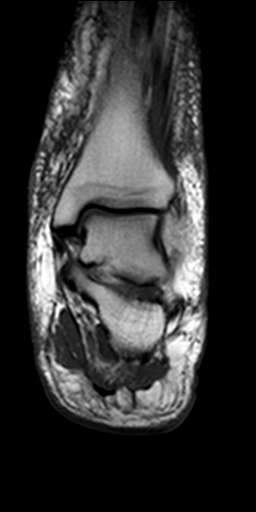
[im 32/32]
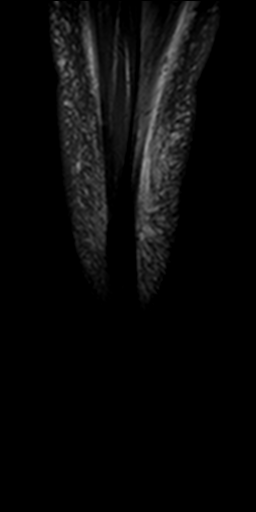

[Series 6: STIR · coronal · 6.0mm · 0.94mm/px · 3 of 32 slices shown]
[im 1/32]
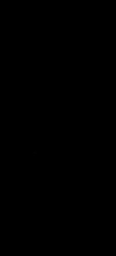
[im 7/32]
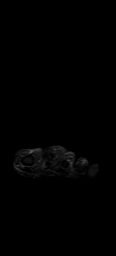
[im 13/32]
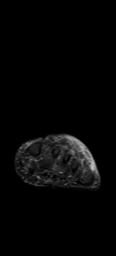

[Series 7: T1 · oblique · 4.0mm · 0.51mm/px · 3 of 20 slices shown (2 of 3)]
[im 1/20]
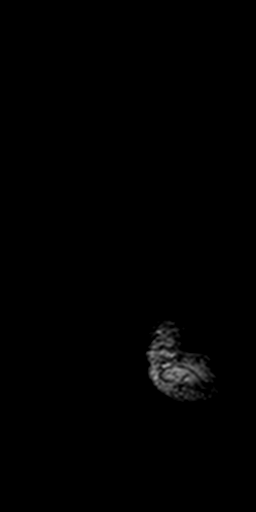
[im 10/20]
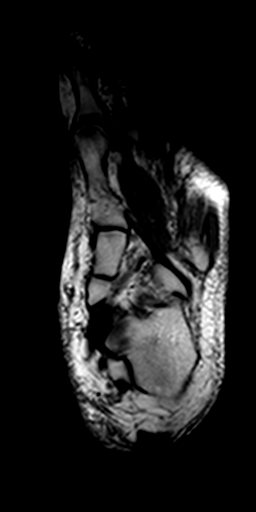
[im 20/20]
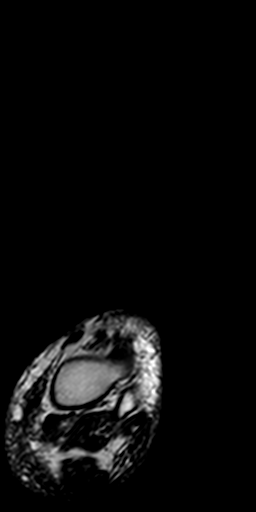

[Series 9: T1 · sagittal · 4.0mm · 0.51mm/px · 3 of 18 slices shown (3 of 3)]
[im 1/18]
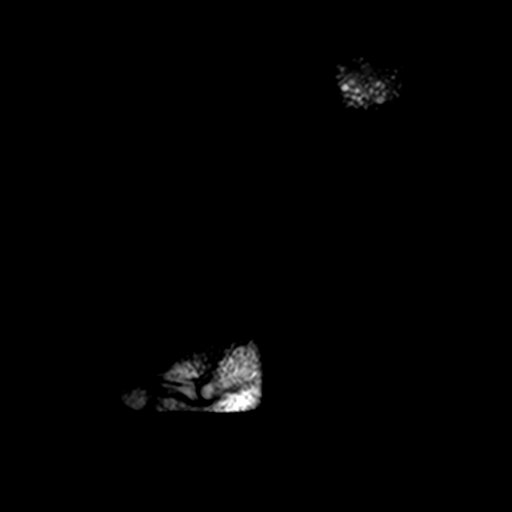
[im 9/18]
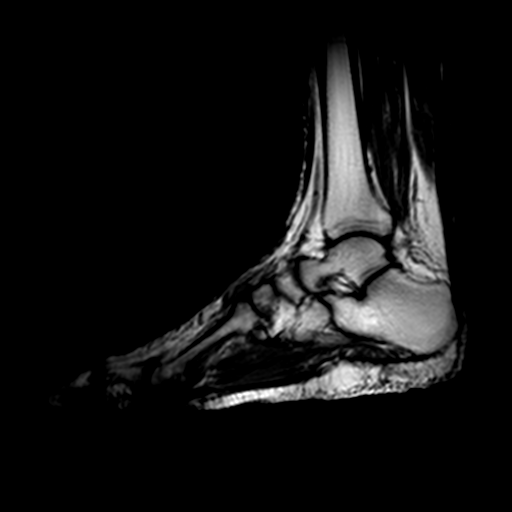
[im 18/18]
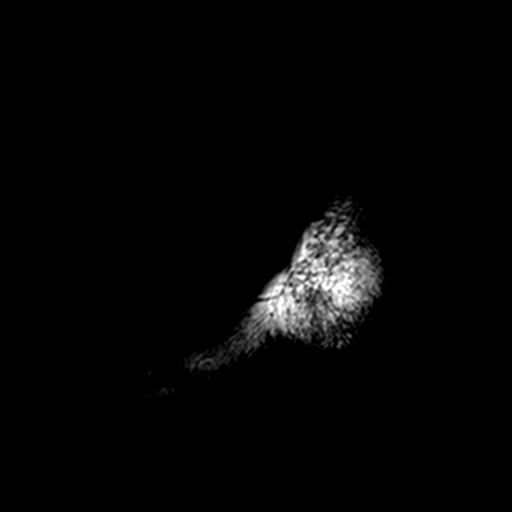

[Series 10: T2 fat-sat · coronal · 6.0mm · 0.39mm/px · 6 of 32 slices shown (1 of 2)]
[im 1/32]
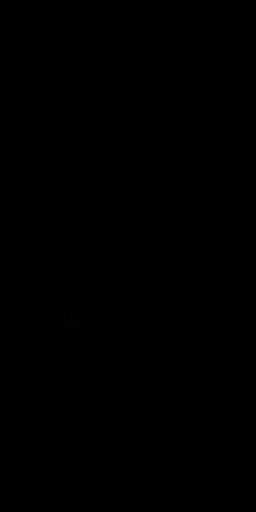
[im 7/32]
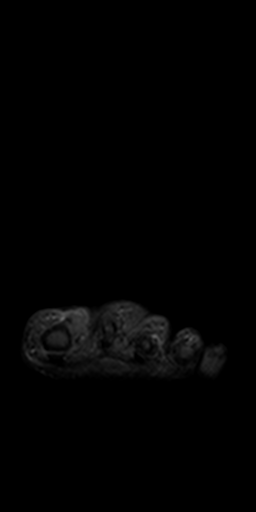
[im 13/32]
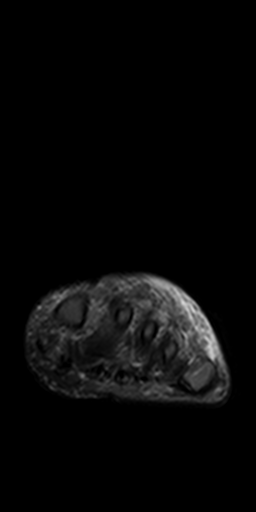
[im 19/32]
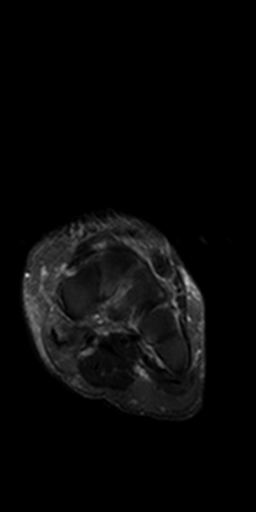
[im 25/32]
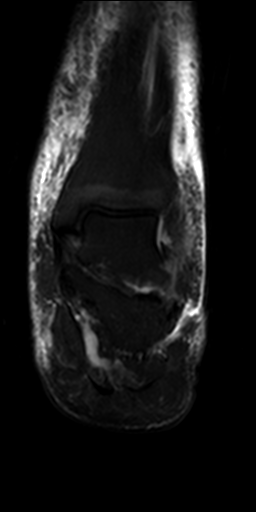
[im 32/32]
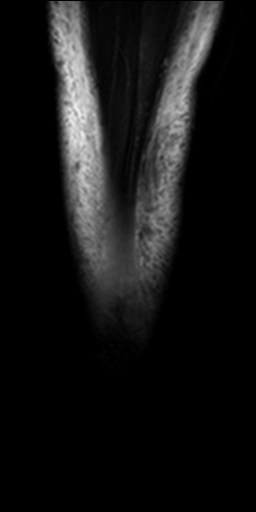

[Series 11: PD fat-sat · sagittal · 4.0mm · 0.51mm/px · 3 of 18 slices shown]
[im 1/18]
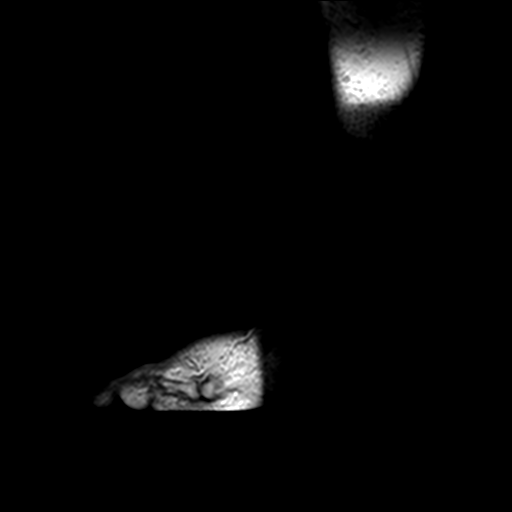
[im 9/18]
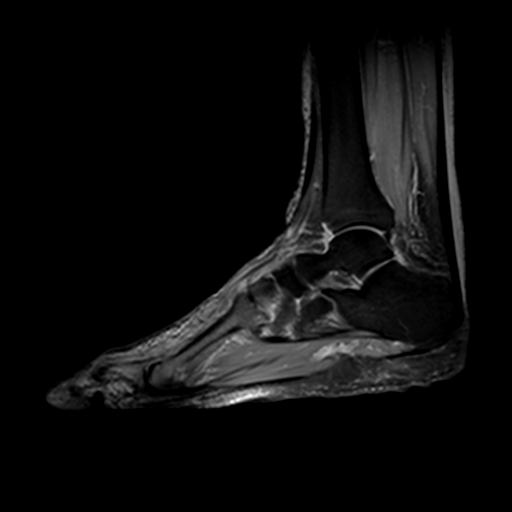
[im 18/18]
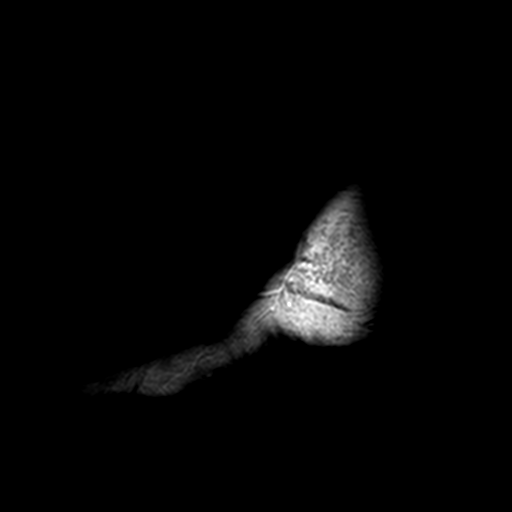

[Series 12: T2 fat-sat · oblique · 4.0mm · 0.51mm/px · 3 of 20 slices shown (2 of 2)]
[im 1/20]
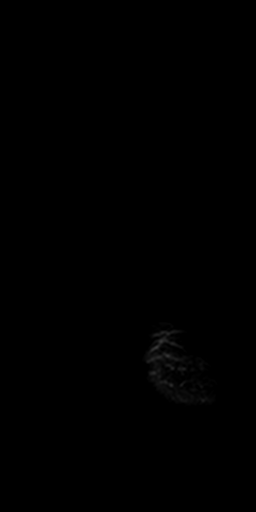
[im 10/20]
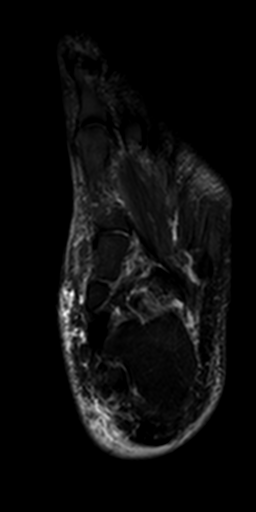
[im 20/20]
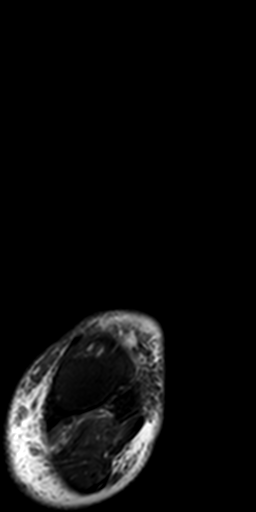

[33 of 40 positions shown; findings below may reference images not displayed]

FINDINGS: There is edema in the middle cuneiform and proximal one-half of the 2nd metatarsal consistent with stress reaction.  No discrete fracture is identified.  Bones otherwise demonstrate normal signal.  There is edema about the distal leg and over the dorsum of the foot.  Bone marrow signal is otherwise normal.  Tendons and ligaments about the ankle are grossly intact although this study is not specifically designed to evaluate those structures.  Muscles demonstrate normal signal.
IMPRESSION: Edema within the middle cuneiform and proximal 2nd metatarsal consistent with stress reaction.  No fracture at this time.

## 2006-04-13 ENCOUNTER — Ambulatory Visit: Payer: Self-pay | Admitting: Family Medicine

## 2006-05-23 ENCOUNTER — Ambulatory Visit: Payer: Self-pay | Admitting: Family Medicine

## 2006-05-31 ENCOUNTER — Encounter: Admission: RE | Admit: 2006-05-31 | Discharge: 2006-05-31 | Payer: Self-pay | Admitting: Sports Medicine

## 2006-06-20 ENCOUNTER — Ambulatory Visit: Payer: Self-pay | Admitting: Family Medicine

## 2006-07-10 ENCOUNTER — Emergency Department (HOSPITAL_COMMUNITY): Admission: EM | Admit: 2006-07-10 | Discharge: 2006-07-11 | Payer: Self-pay | Admitting: *Deleted

## 2006-07-19 ENCOUNTER — Ambulatory Visit: Payer: Self-pay | Admitting: Family Medicine

## 2006-07-31 ENCOUNTER — Encounter (INDEPENDENT_AMBULATORY_CARE_PROVIDER_SITE_OTHER): Payer: Self-pay | Admitting: Cardiology

## 2006-07-31 ENCOUNTER — Ambulatory Visit (HOSPITAL_COMMUNITY): Admission: RE | Admit: 2006-07-31 | Discharge: 2006-07-31 | Payer: Self-pay | Admitting: Sports Medicine

## 2006-08-18 ENCOUNTER — Inpatient Hospital Stay (HOSPITAL_COMMUNITY): Admission: EM | Admit: 2006-08-18 | Discharge: 2006-08-21 | Payer: Self-pay | Admitting: Emergency Medicine

## 2006-08-21 ENCOUNTER — Ambulatory Visit: Payer: Self-pay | Admitting: Family Medicine

## 2006-08-31 IMAGING — CT CT HEAD W/O CM
1 of 2 series · 13 of 30 positions shown, 17 images · IV contrast (agent unspecified)
Comparison: 01/06/2006.

CLINICAL DATA: Right-sided weakness, dizziness and headache. 
 HEAD CT WITHOUT CONTRAST:
TECHNIQUE: Contiguous axial images were obtained from the base of the skull through the vertex according to standard protocol without contrast.

[Series 2: brain · axial · 0.47mm/px · z∈[+127,+248]mm · 13 of 28 slices shown, 17 images]
[im 2/28  brain]
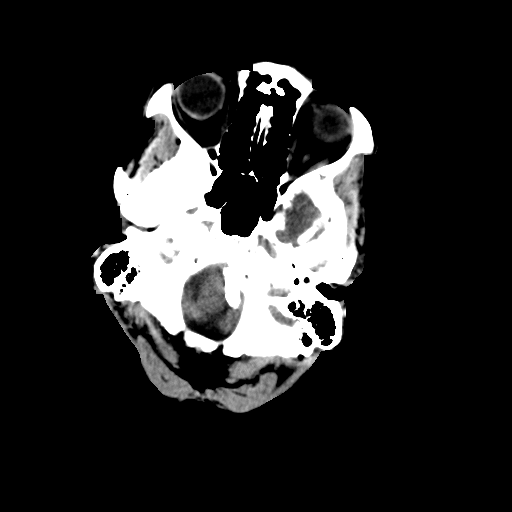
[im 2/28  bone]
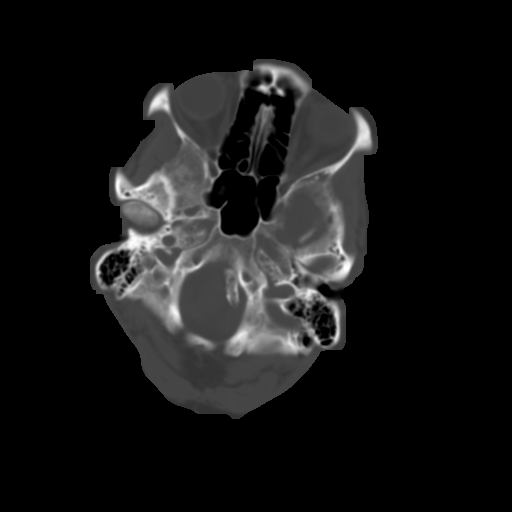
[im 4/28  brain]
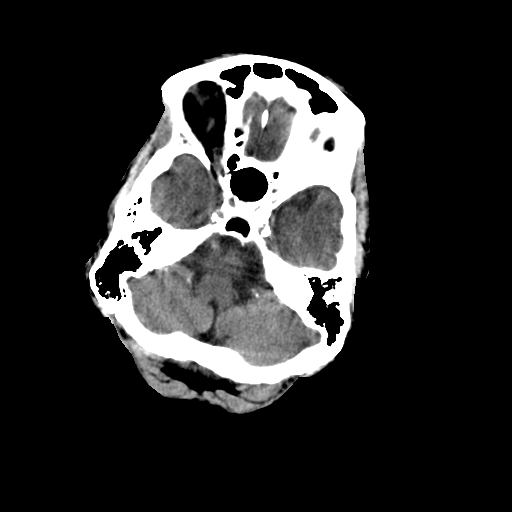
[im 6/28  brain]
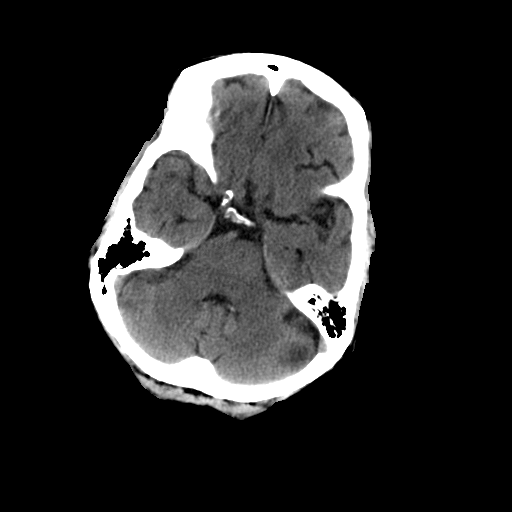
[im 8/28  brain]
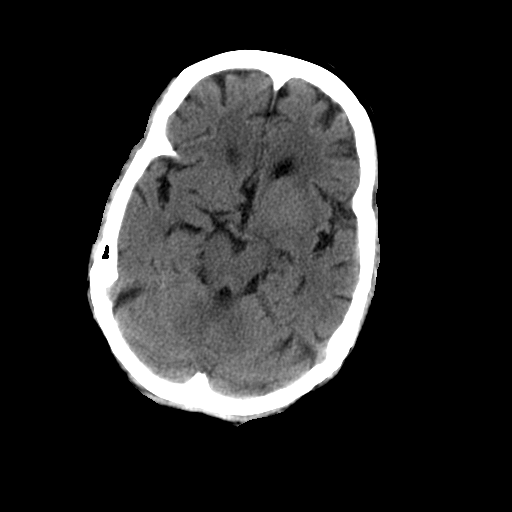
[im 10/28  brain]
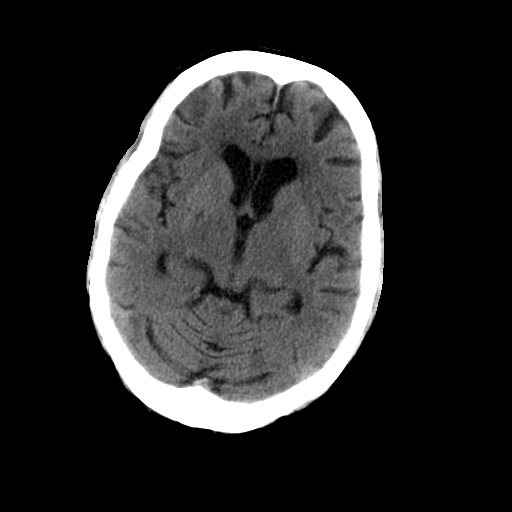
[im 10/28  bone]
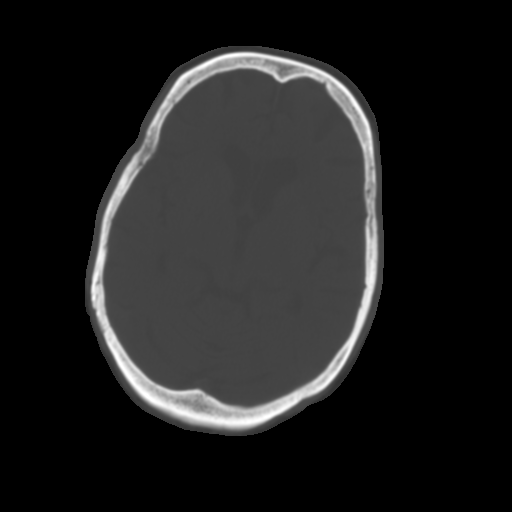
[im 12/28  brain]
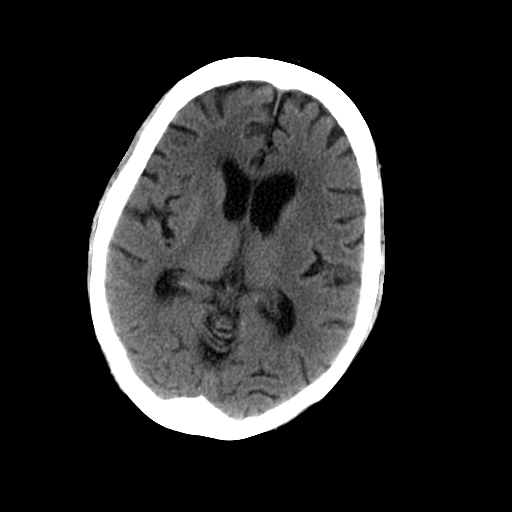
[im 14/28  brain]
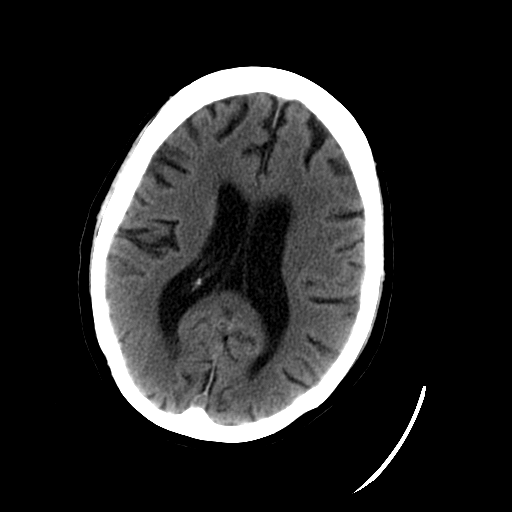
[im 16/28  brain]
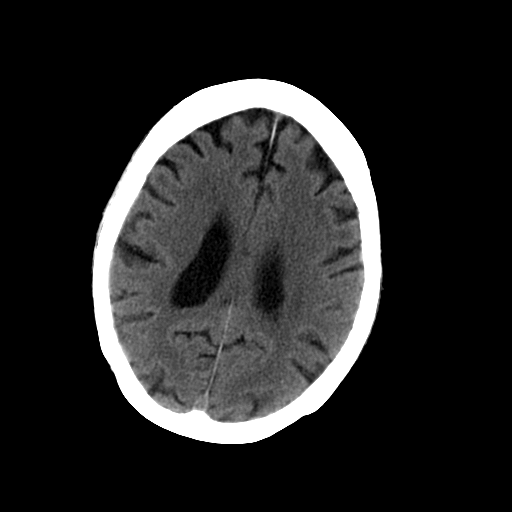
[im 18/28  brain]
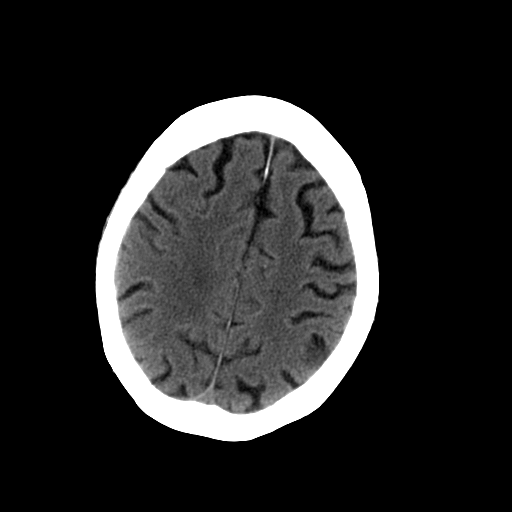
[im 18/28  bone]
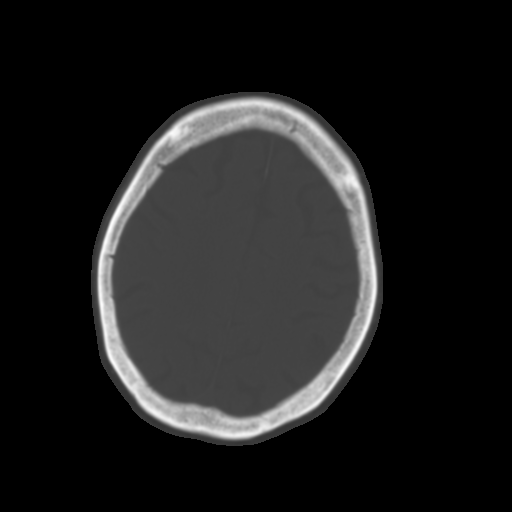
[im 20/28  brain]
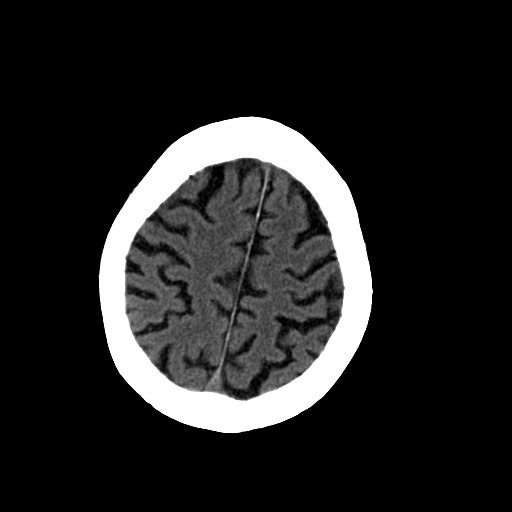
[im 22/28  brain]
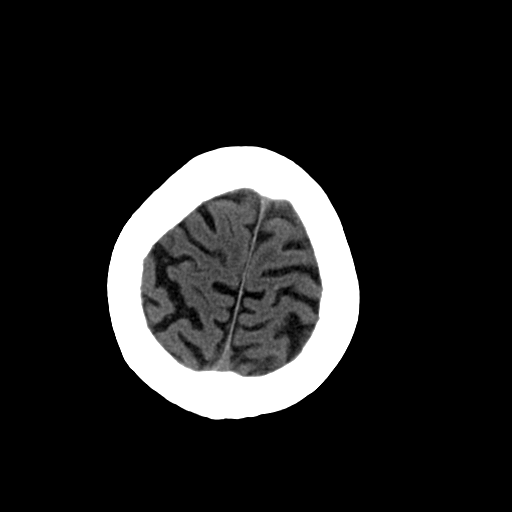
[im 24/28  brain]
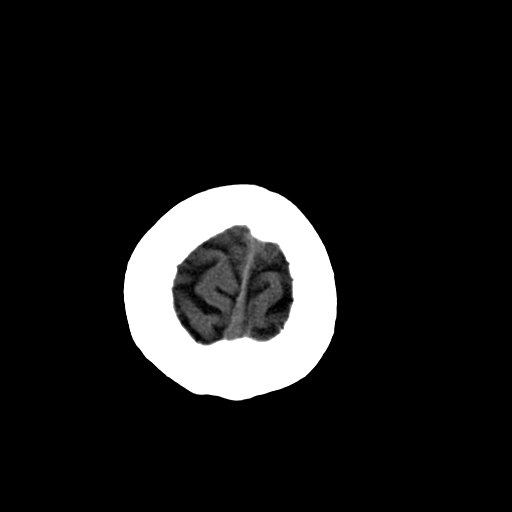
[im 26/28  brain]
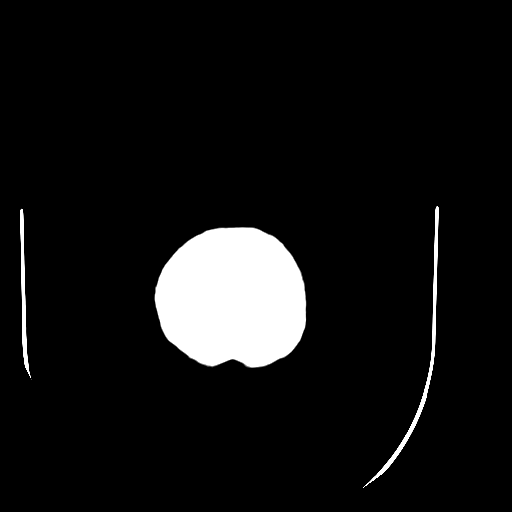
[im 26/28  bone]
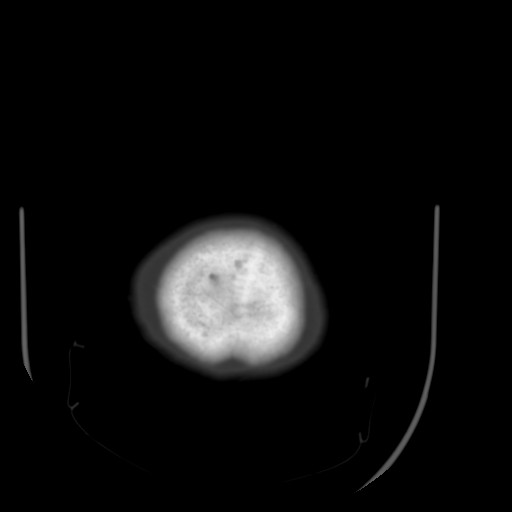

[13 of 30 positions shown; findings below may reference images not displayed]

FINDINGS: Atrophy, chronic small vessel white matter ischemic changes, and remote right basal ganglia lacunes are stable.  No evidence of acute intracranial abnormality identified, including mass or mass effect, hydrocephalus, extraaxial fluid collection, midline shift, hemorrhage, or acute infarct.  Please note that acute infarct may be occult on CT.  The visualized bony calvarium and paranasal sinuses are unremarkable. Heavy atherosclerotic calcification in the vertebrobasilar and internal carotid arteries are noted.
IMPRESSION: 1.  No evidence of acute intracranial abnormality. 
 2.  Stable atrophy, chronic small vessel white matter ischemic changes, and remote right basal ganglia lacune.

## 2006-09-03 ENCOUNTER — Ambulatory Visit: Payer: Self-pay | Admitting: Sports Medicine

## 2006-10-08 ENCOUNTER — Emergency Department (HOSPITAL_COMMUNITY): Admission: EM | Admit: 2006-10-08 | Discharge: 2006-10-08 | Payer: Self-pay | Admitting: Emergency Medicine

## 2006-10-09 IMAGING — CR DG CHEST 2V
2 series · 2 of 2 positions shown · non-contrast
Comparison: 07/11/05.

CLINICAL DATA: Chest pain. 
 CHEST ? 2 VIEW:

[w chest pa]
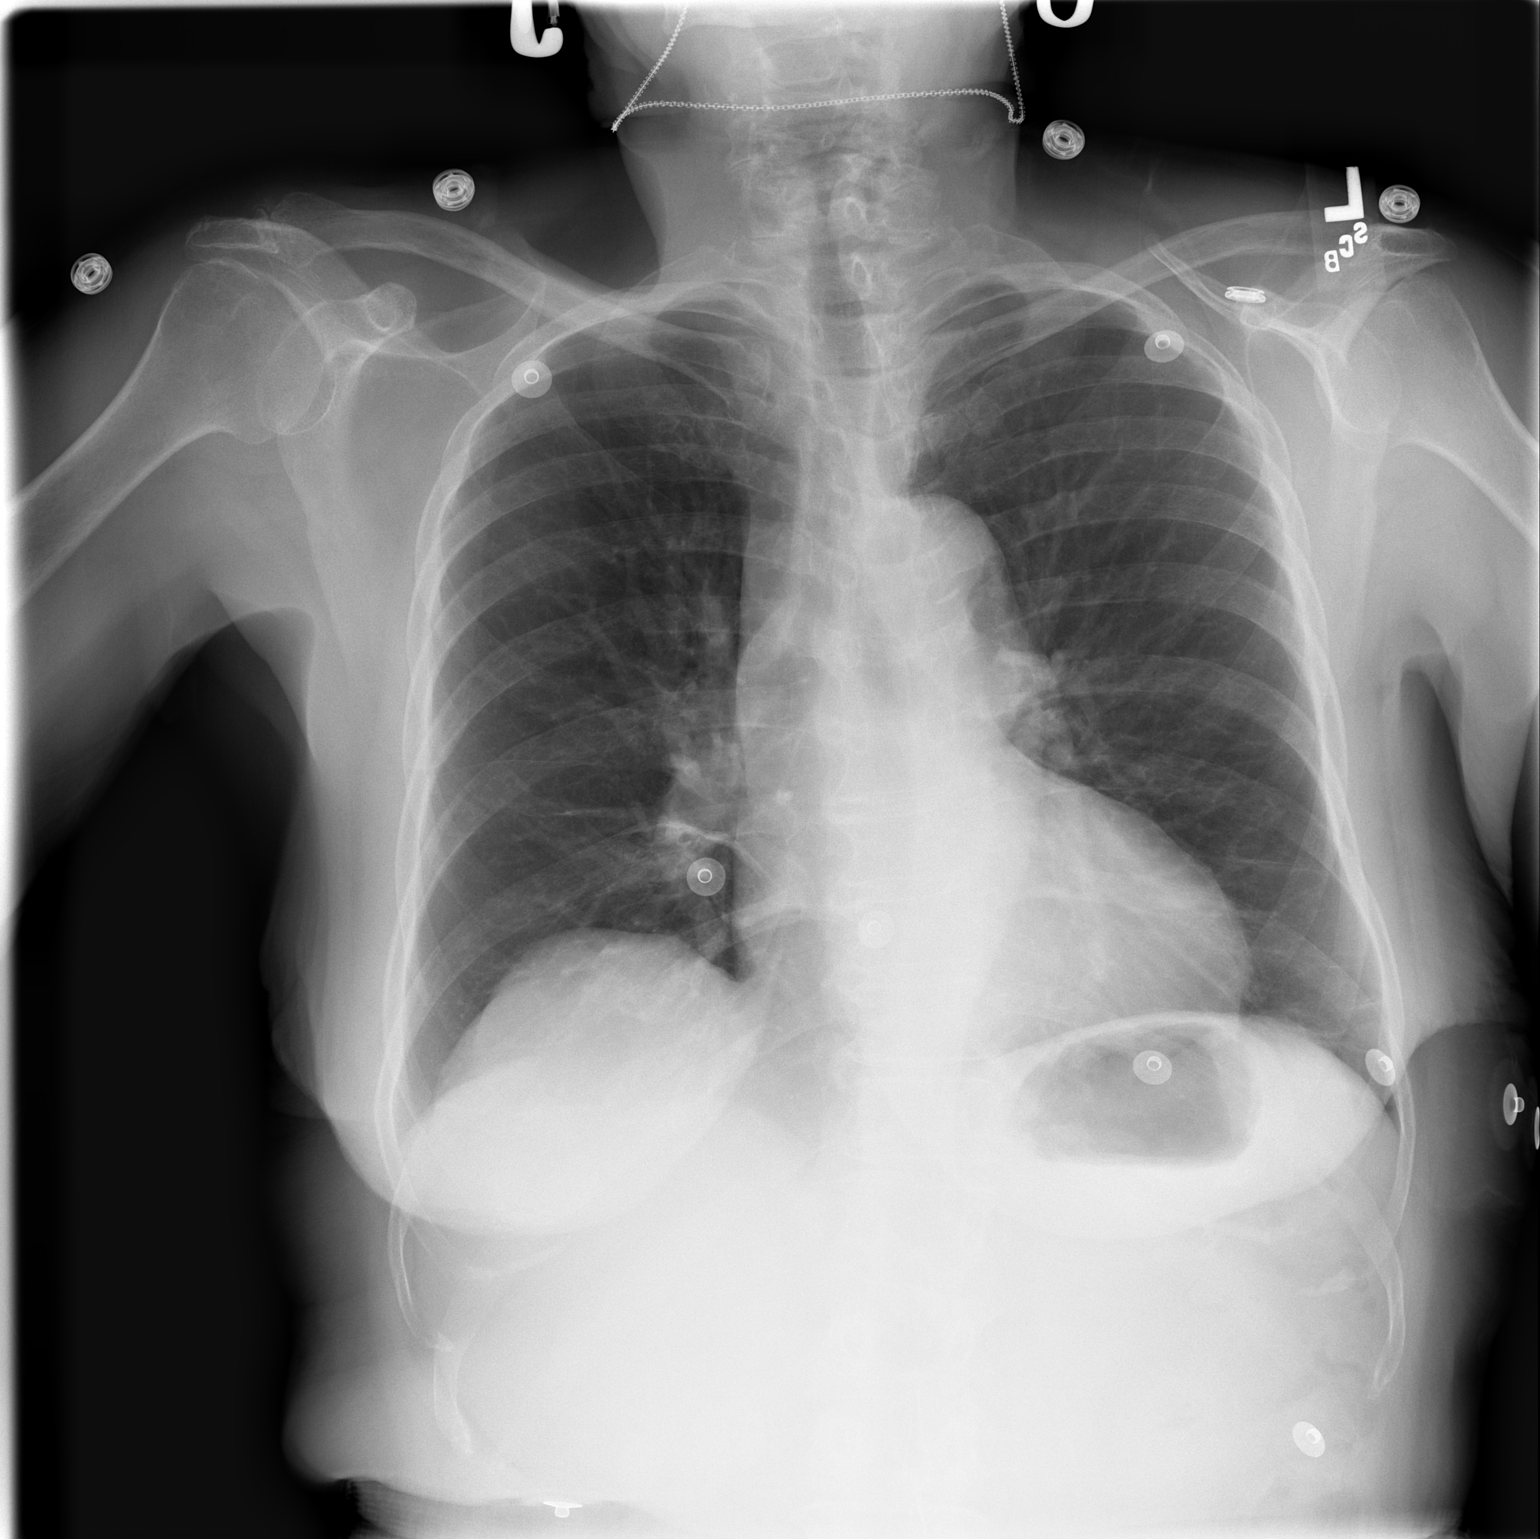

[w chest lat]
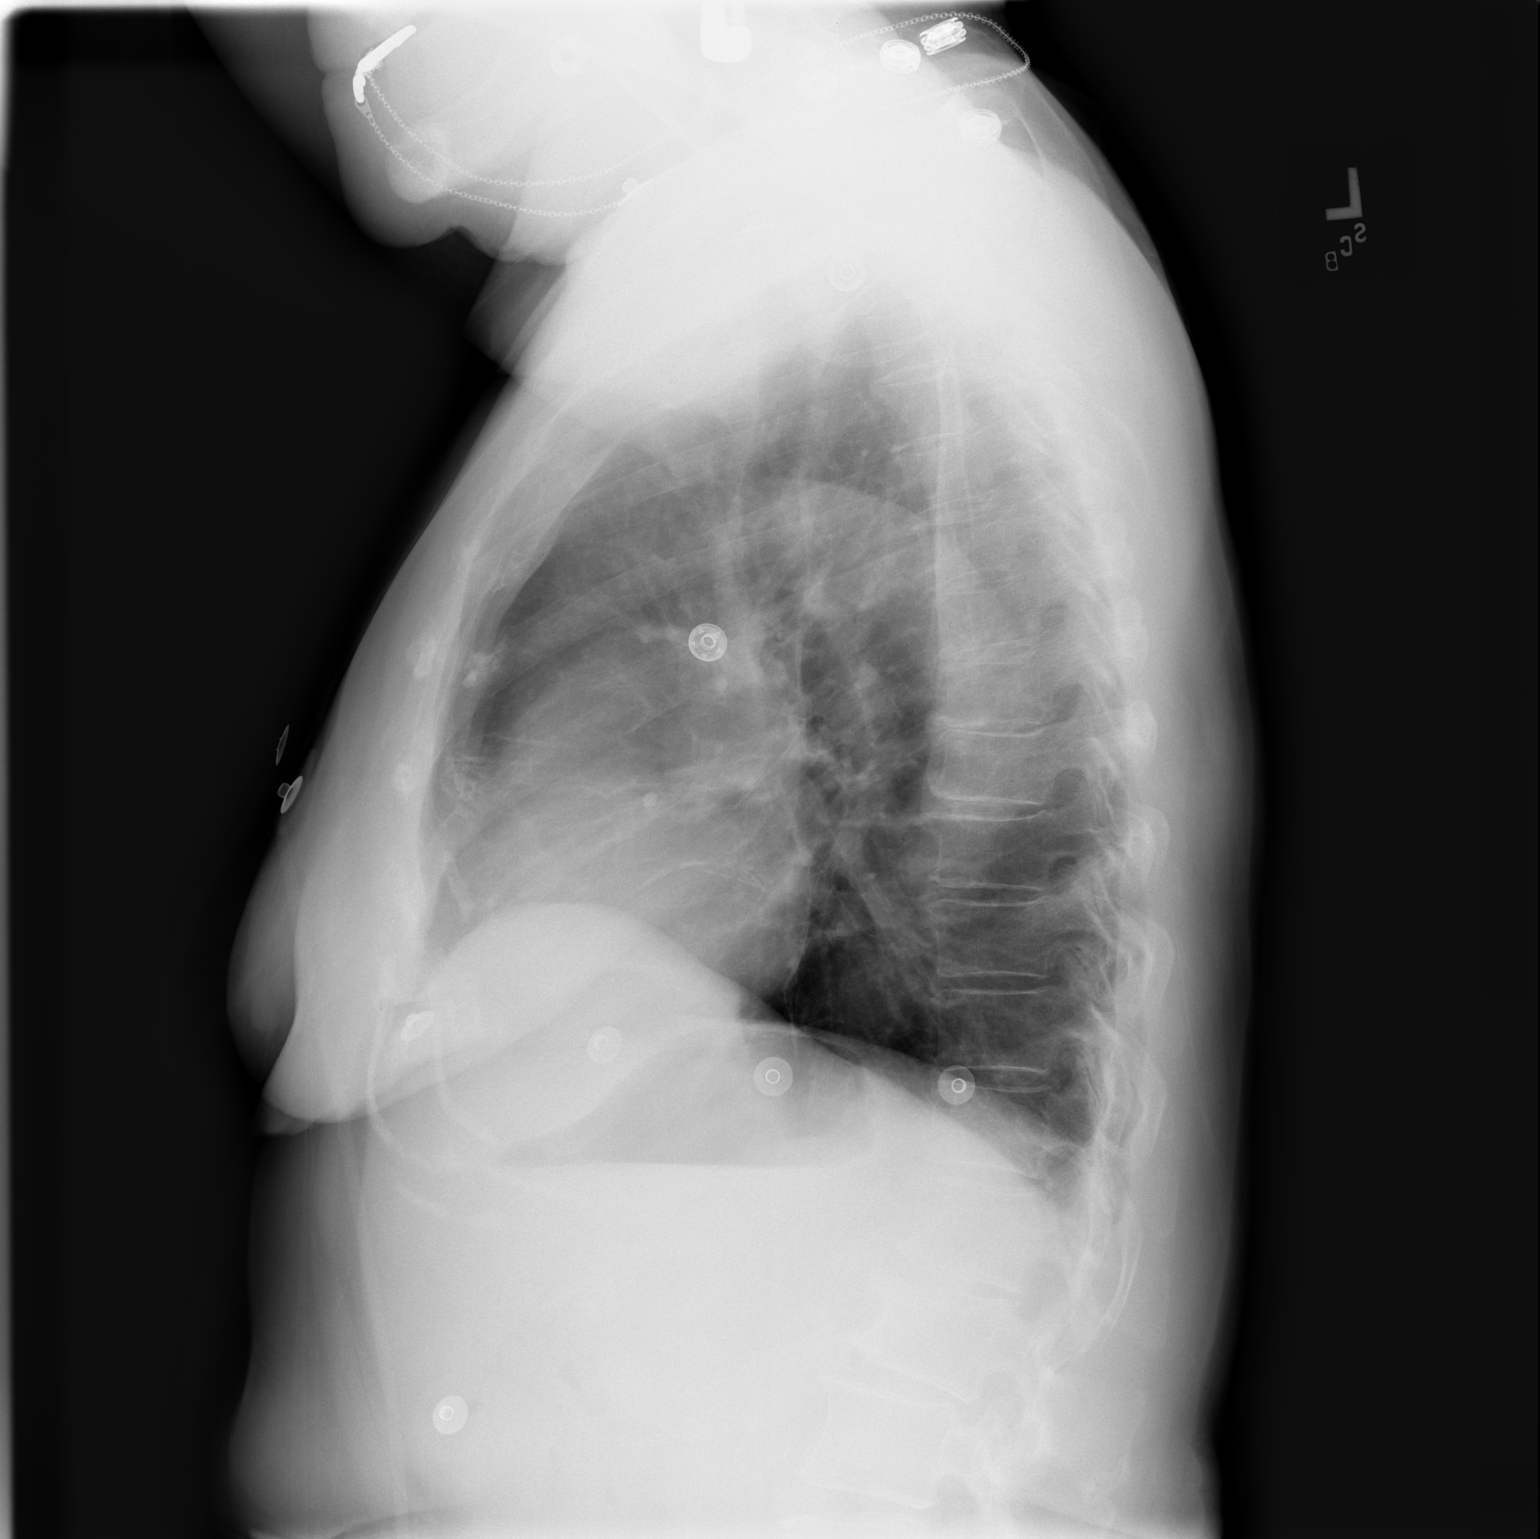

[2 of 2 positions shown; findings below may reference images not displayed]

FINDINGS: The cardiomediastinal contours are stable with mild aortic tortuosity.  There is stable linear scarring or atelectasis of both lung bases.  No confluent airspace opacity, edema, or pleural effusion is seen.
IMPRESSION: Stable examination with linear bibasilar scarring or atelectasis.  No acute findings.

## 2006-10-11 ENCOUNTER — Encounter (INDEPENDENT_AMBULATORY_CARE_PROVIDER_SITE_OTHER): Payer: Self-pay | Admitting: *Deleted

## 2006-10-17 ENCOUNTER — Ambulatory Visit (HOSPITAL_COMMUNITY): Admission: RE | Admit: 2006-10-17 | Discharge: 2006-10-17 | Payer: Self-pay | Admitting: Gastroenterology

## 2006-10-17 ENCOUNTER — Encounter (INDEPENDENT_AMBULATORY_CARE_PROVIDER_SITE_OTHER): Payer: Self-pay | Admitting: *Deleted

## 2006-10-19 ENCOUNTER — Ambulatory Visit (HOSPITAL_COMMUNITY): Admission: RE | Admit: 2006-10-19 | Discharge: 2006-10-19 | Payer: Self-pay | Admitting: Gastroenterology

## 2006-11-16 ENCOUNTER — Encounter (INDEPENDENT_AMBULATORY_CARE_PROVIDER_SITE_OTHER): Payer: Self-pay | Admitting: Specialist

## 2006-11-16 ENCOUNTER — Inpatient Hospital Stay (HOSPITAL_COMMUNITY): Admission: RE | Admit: 2006-11-16 | Discharge: 2006-11-22 | Payer: Self-pay | Admitting: General Surgery

## 2006-12-11 ENCOUNTER — Ambulatory Visit: Payer: Self-pay | Admitting: Hematology and Oncology

## 2006-12-14 LAB — CBC WITH DIFFERENTIAL/PLATELET
Basophils Absolute: 0.1 10*3/uL (ref 0.0–0.1)
Eosinophils Absolute: 0 10*3/uL (ref 0.0–0.5)
HCT: 32.5 % — ABNORMAL LOW (ref 34.8–46.6)
HGB: 10.6 g/dL — ABNORMAL LOW (ref 11.6–15.9)
LYMPH%: 24.8 % (ref 14.0–48.0)
MCV: 70.7 fL — ABNORMAL LOW (ref 81.0–101.0)
MONO#: 0.2 10*3/uL (ref 0.1–0.9)
MONO%: 5.7 % (ref 0.0–13.0)
NEUT#: 2.9 10*3/uL (ref 1.5–6.5)
Platelets: 248 10*3/uL (ref 145–400)
WBC: 4.4 10*3/uL (ref 3.9–10.0)

## 2006-12-14 LAB — COMPREHENSIVE METABOLIC PANEL
Albumin: 3.7 g/dL (ref 3.5–5.2)
Alkaline Phosphatase: 56 U/L (ref 39–117)
BUN: 12 mg/dL (ref 6–23)
CO2: 18 mEq/L — ABNORMAL LOW (ref 19–32)
Glucose, Bld: 257 mg/dL — ABNORMAL HIGH (ref 70–99)
Total Bilirubin: 0.7 mg/dL (ref 0.3–1.2)

## 2006-12-14 LAB — CEA: CEA: 1.6 ng/mL (ref 0.0–5.0)

## 2006-12-17 ENCOUNTER — Ambulatory Visit: Payer: Self-pay | Admitting: Internal Medicine

## 2006-12-17 ENCOUNTER — Ambulatory Visit: Admission: RE | Admit: 2006-12-17 | Discharge: 2007-03-17 | Payer: Self-pay | Admitting: *Deleted

## 2006-12-17 ENCOUNTER — Inpatient Hospital Stay (HOSPITAL_COMMUNITY): Admission: EM | Admit: 2006-12-17 | Discharge: 2006-12-24 | Payer: Self-pay | Admitting: Emergency Medicine

## 2006-12-18 ENCOUNTER — Encounter (INDEPENDENT_AMBULATORY_CARE_PROVIDER_SITE_OTHER): Payer: Self-pay | Admitting: Cardiology

## 2006-12-20 ENCOUNTER — Ambulatory Visit: Payer: Self-pay | Admitting: Cardiothoracic Surgery

## 2006-12-21 ENCOUNTER — Encounter: Payer: Self-pay | Admitting: Vascular Surgery

## 2006-12-27 ENCOUNTER — Ambulatory Visit: Payer: Self-pay | Admitting: Sports Medicine

## 2006-12-27 ENCOUNTER — Encounter: Payer: Self-pay | Admitting: Family Medicine

## 2006-12-27 LAB — CONVERTED CEMR LAB
ALT: 8 units/L (ref 0–35)
Alkaline Phosphatase: 58 units/L (ref 39–117)
Creatinine, Ser: 1.06 mg/dL (ref 0.40–1.20)
Total Bilirubin: 0.5 mg/dL (ref 0.3–1.2)
Total Protein: 7.8 g/dL (ref 6.0–8.3)

## 2007-01-03 ENCOUNTER — Encounter: Payer: Self-pay | Admitting: Family Medicine

## 2007-01-07 ENCOUNTER — Ambulatory Visit: Payer: Self-pay | Admitting: Family Medicine

## 2007-01-07 ENCOUNTER — Encounter: Payer: Self-pay | Admitting: Family Medicine

## 2007-01-07 LAB — CONVERTED CEMR LAB
Glucose, Bld: 83 mg/dL (ref 70–99)
Potassium: 4.4 meq/L (ref 3.5–5.3)
Sodium: 137 meq/L (ref 135–145)

## 2007-01-10 DIAGNOSIS — K210 Gastro-esophageal reflux disease with esophagitis: Secondary | ICD-10-CM

## 2007-01-10 DIAGNOSIS — I10 Essential (primary) hypertension: Secondary | ICD-10-CM | POA: Insufficient documentation

## 2007-01-10 DIAGNOSIS — E1140 Type 2 diabetes mellitus with diabetic neuropathy, unspecified: Secondary | ICD-10-CM | POA: Insufficient documentation

## 2007-01-10 DIAGNOSIS — I5022 Chronic systolic (congestive) heart failure: Secondary | ICD-10-CM

## 2007-01-10 DIAGNOSIS — I2510 Atherosclerotic heart disease of native coronary artery without angina pectoris: Secondary | ICD-10-CM | POA: Insufficient documentation

## 2007-01-10 DIAGNOSIS — J45909 Unspecified asthma, uncomplicated: Secondary | ICD-10-CM | POA: Insufficient documentation

## 2007-01-10 DIAGNOSIS — M159 Polyosteoarthritis, unspecified: Secondary | ICD-10-CM | POA: Insufficient documentation

## 2007-01-10 IMAGING — CR DG CHEST 1V PORT
1 series · 1 of 1 positions shown · non-contrast
Comparison: 08/18/2006.

CLINICAL DATA: Rectal cancer. PICC placement.

PORTABLE CHEST - 1 VIEW

[view not recorded]
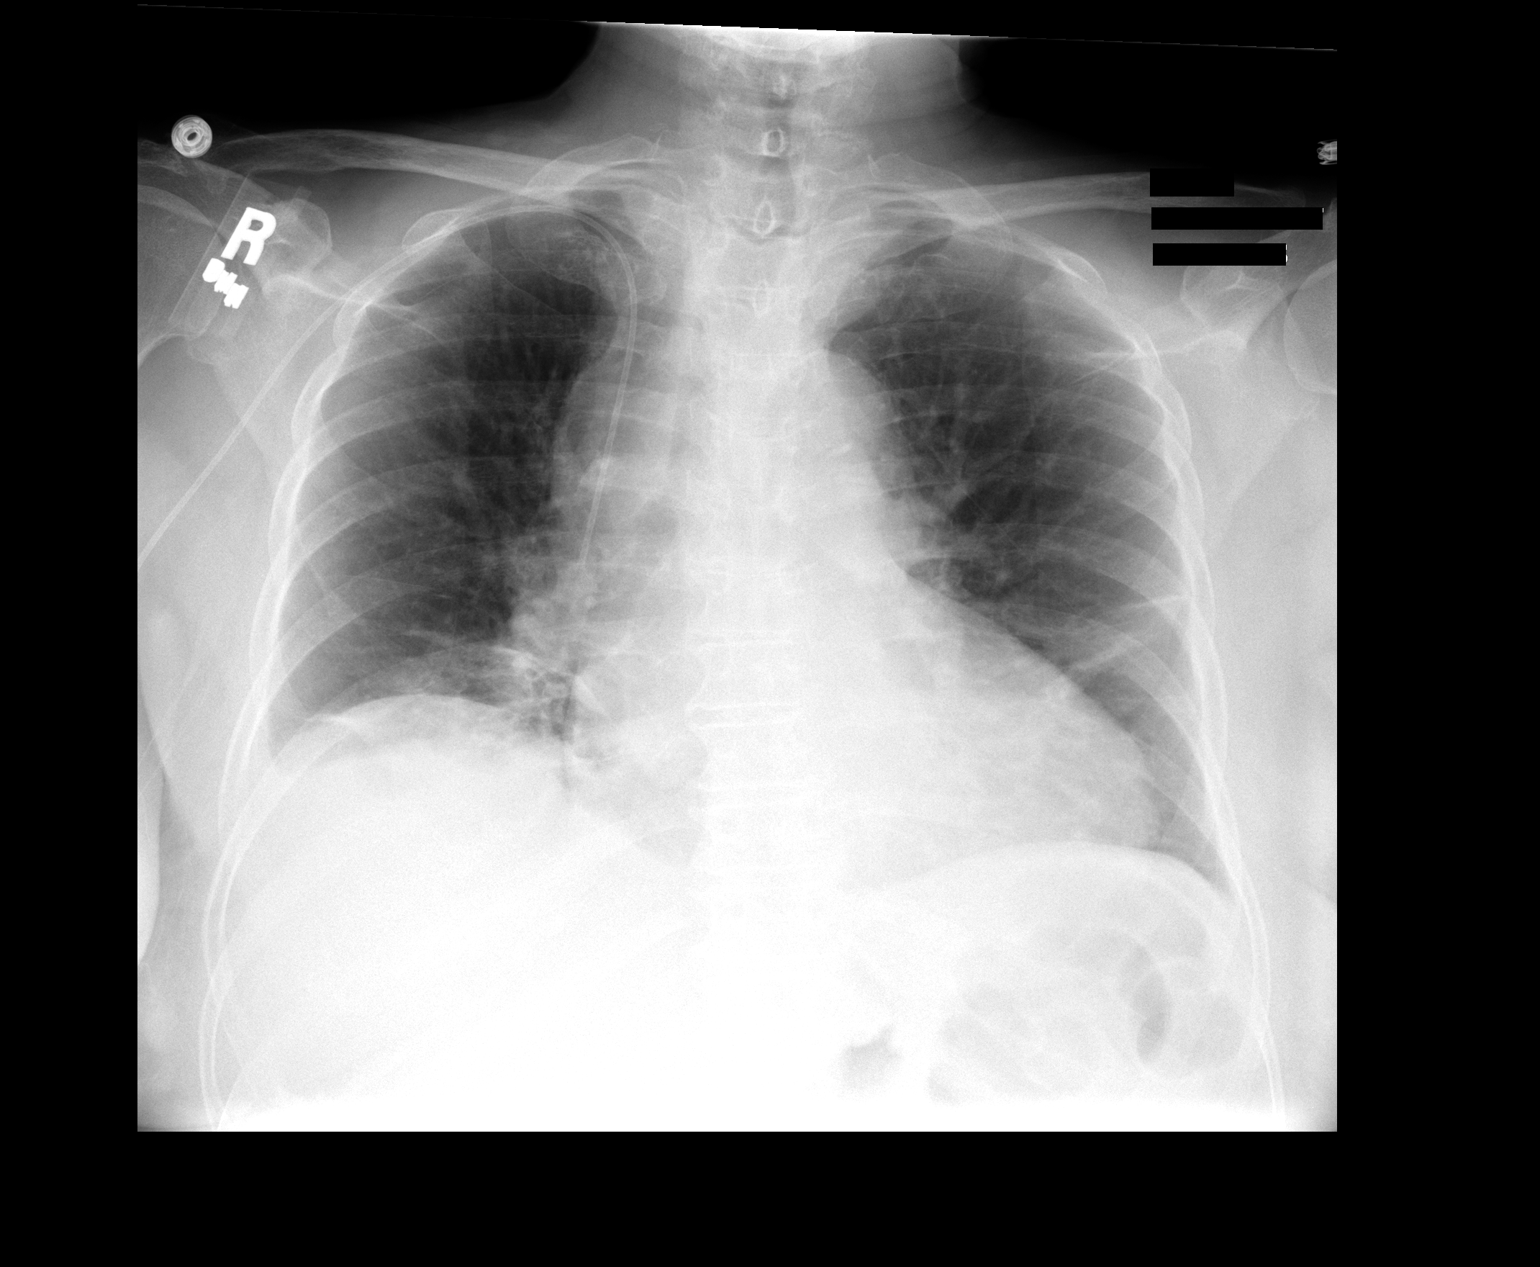

[1 of 1 positions shown; findings below may reference images not displayed]

FINDINGS: Interval right PICC with its tip in the inferior aspect of the
superior vena cava. Stable tortuous aorta. Stable enlarged cardiac silhouette.
Mild compressive linear density in both lower lung zones. Poor inspiration.
Diffuse peribronchial thickening with mild progression. Thoracic spine
degenerative changes and mild scoliosis.  

IMPRESSION

1. Right PICC tip in the superior vena cava.
2. Mild bibasilar atelectasis.
3. Stable cardiomegaly and pulmonary vascular congestion.
4. Mild bronchitic changes with mild progression.

## 2007-01-11 ENCOUNTER — Encounter (INDEPENDENT_AMBULATORY_CARE_PROVIDER_SITE_OTHER): Payer: Self-pay | Admitting: *Deleted

## 2007-01-17 ENCOUNTER — Encounter: Payer: Self-pay | Admitting: Family Medicine

## 2007-01-17 ENCOUNTER — Encounter (INDEPENDENT_AMBULATORY_CARE_PROVIDER_SITE_OTHER): Payer: Self-pay | Admitting: *Deleted

## 2007-01-26 ENCOUNTER — Encounter (INDEPENDENT_AMBULATORY_CARE_PROVIDER_SITE_OTHER): Payer: Self-pay | Admitting: *Deleted

## 2007-01-26 LAB — CONVERTED CEMR LAB
LDL Cholesterol: 49 mg/dL
LDL Cholesterol: 49 mg/dL

## 2007-01-29 ENCOUNTER — Encounter: Payer: Self-pay | Admitting: Family Medicine

## 2007-01-29 ENCOUNTER — Ambulatory Visit: Payer: Self-pay | Admitting: Family Medicine

## 2007-01-29 DIAGNOSIS — E785 Hyperlipidemia, unspecified: Secondary | ICD-10-CM | POA: Insufficient documentation

## 2007-01-30 ENCOUNTER — Telehealth: Payer: Self-pay | Admitting: Family Medicine

## 2007-01-30 LAB — CONVERTED CEMR LAB
ALT: <8 units/L (ref 0–35)
Albumin: 3.6 g/dL (ref 3.5–5.2)
Alkaline Phosphatase: 49 units/L (ref 39–117)
Glucose, Bld: 190 mg/dL — ABNORMAL HIGH (ref 70–99)
MCHC: 31.5 g/dL (ref 30.0–36.0)
Potassium: 4 meq/L (ref 3.5–5.3)
RBC: 4.28 M/uL (ref 3.87–5.11)
Sodium: 141 meq/L (ref 135–145)
Total Protein: 7.2 g/dL (ref 6.0–8.3)
WBC: 5.6 10*3/uL (ref 4.0–10.5)

## 2007-01-31 ENCOUNTER — Encounter: Payer: Self-pay | Admitting: Family Medicine

## 2007-02-01 ENCOUNTER — Encounter (INDEPENDENT_AMBULATORY_CARE_PROVIDER_SITE_OTHER): Payer: Self-pay | Admitting: *Deleted

## 2007-02-07 IMAGING — CT CT ANGIO CHEST
2 of 4 series · 19 of 36 positions shown · IV contrast ([ID] OMNI [id number])
Comparison: Chest x-ray from 12/17/06.

CLINICAL DATA: Chest pain and shortness of breath.  Recent diagnosis of rectal cancer with resection.  
 CT ANGIOGRAPHY OF CHEST:
TECHNIQUE: Multidetector CT imaging of the chest was performed during bolus injection of intravenous contrast.  Multiplanar CT angiographic image reconstructions were generated to evaluate the vascular anatomy.
 Contrast:  100 cc Omnipaque 300.

[Series 2: pe · axial · 0.66mm/px · z∈[-259,-20]mm · 16 of 215 slices shown]
[im 12/215  lung]
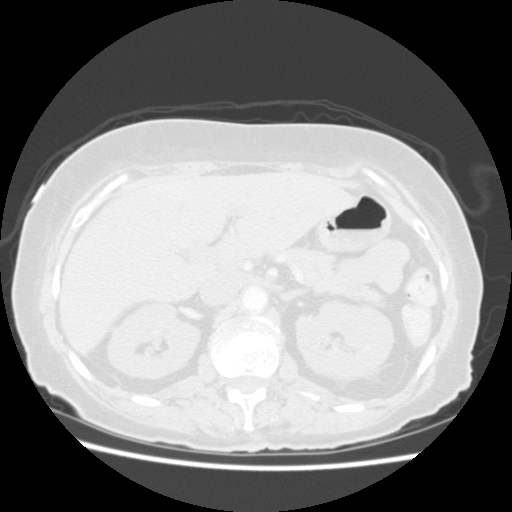
[im 24/215  mediastinal]
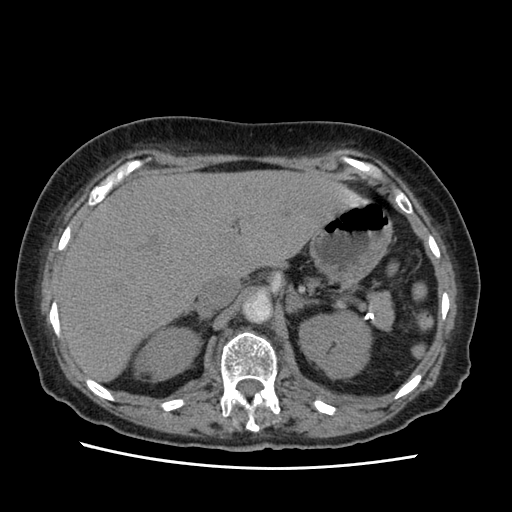
[im 36/215  lung]
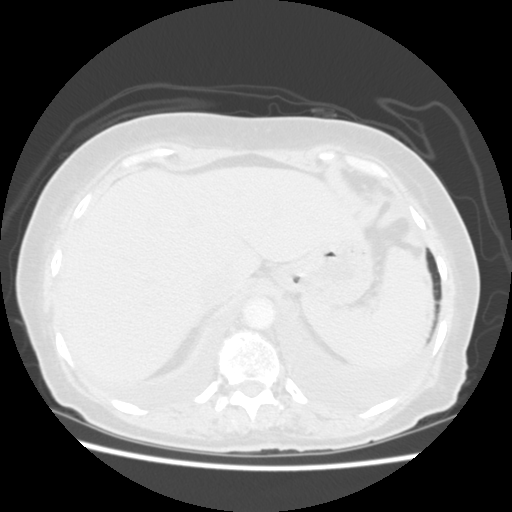
[im 48/215  mediastinal]
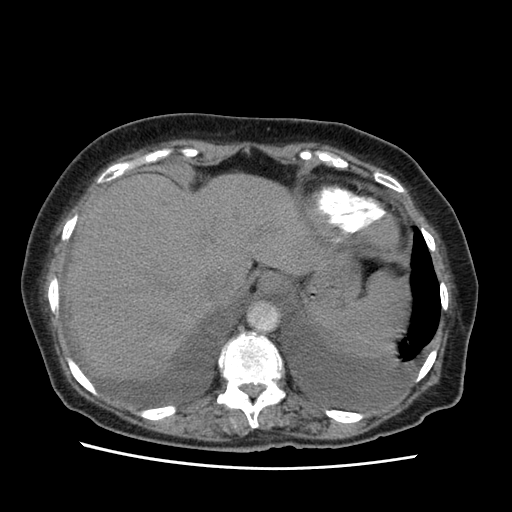
[im 60/215  lung]
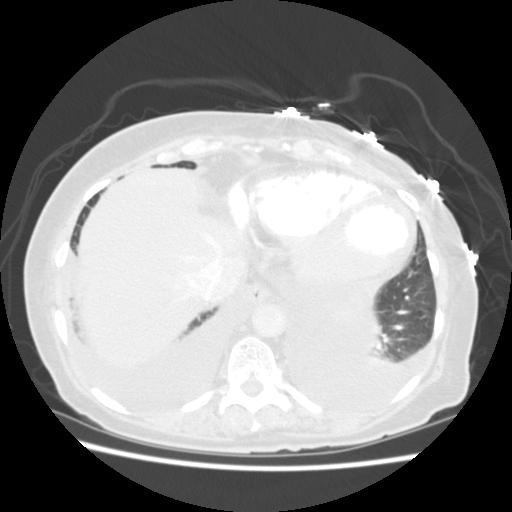
[im 72/215  mediastinal]
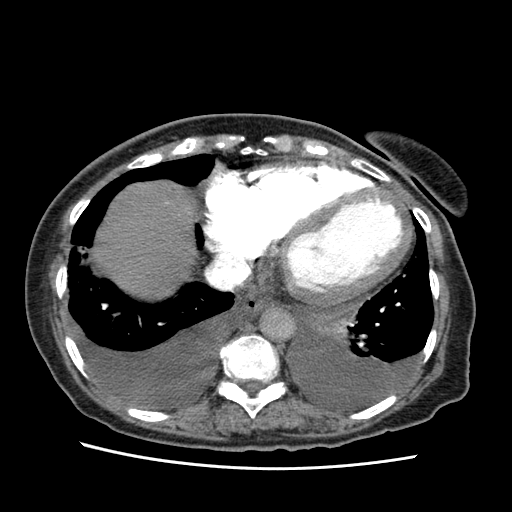
[im 84/215  lung]
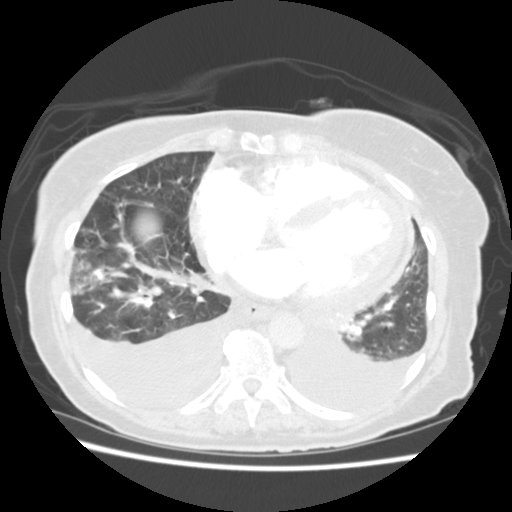
[im 96/215  mediastinal]
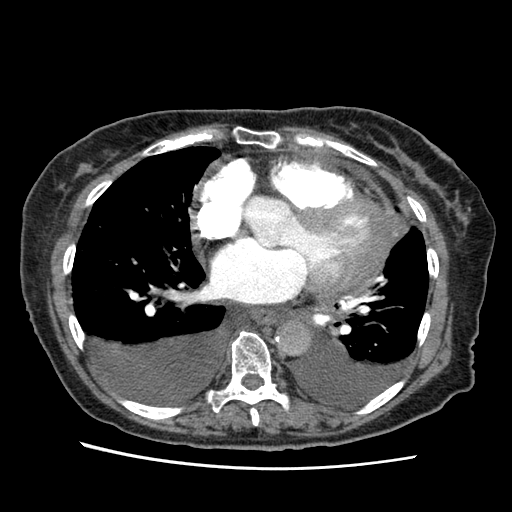
[im 119/215  lung]
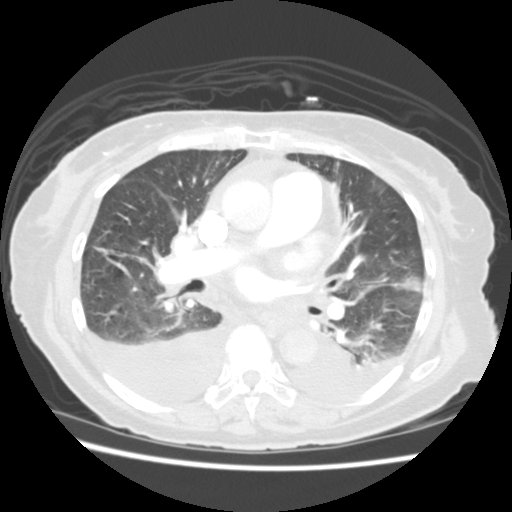
[im 131/215  mediastinal]
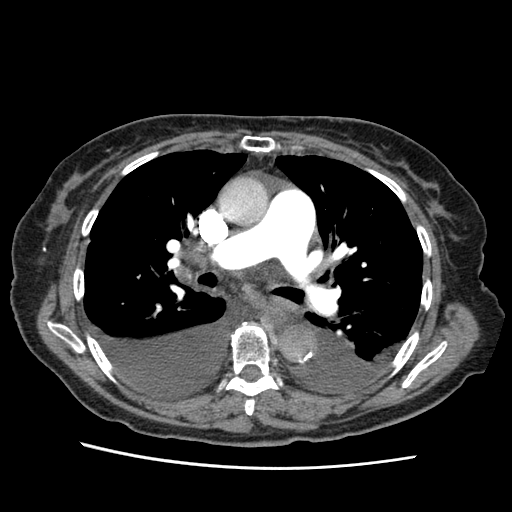
[im 143/215  lung]
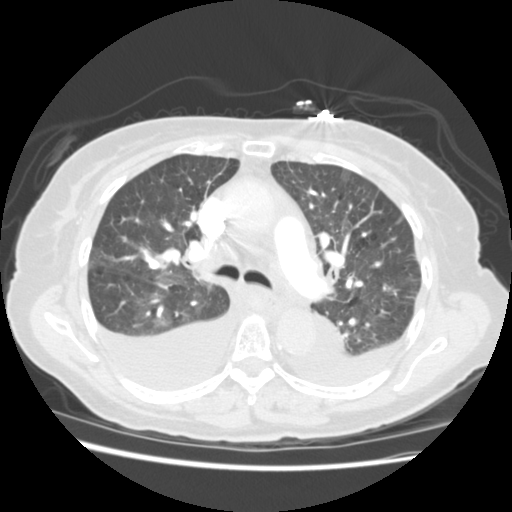
[im 155/215  mediastinal]
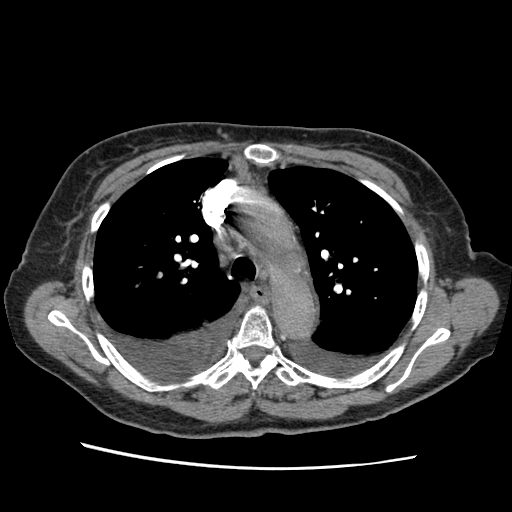
[im 167/215  lung]
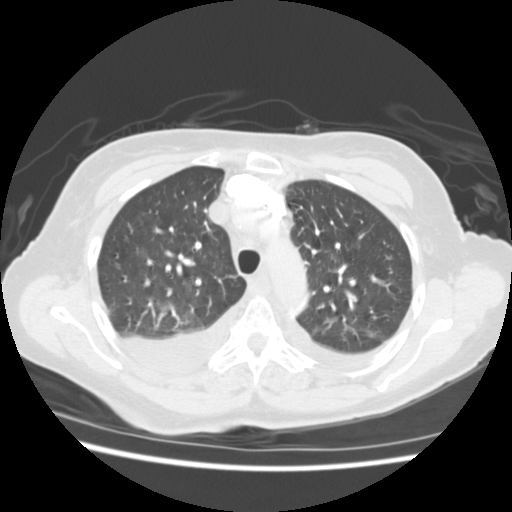
[im 179/215  mediastinal]
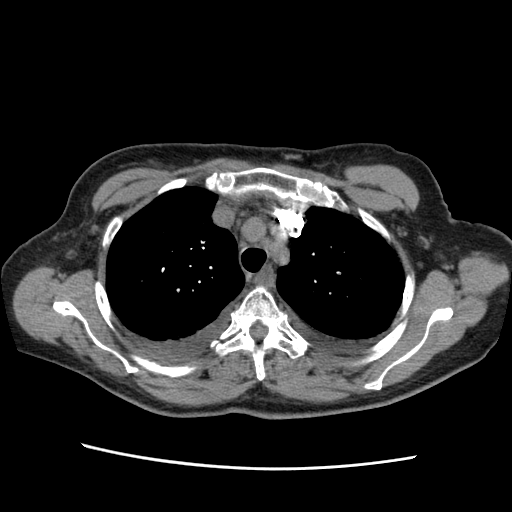
[im 191/215  lung]
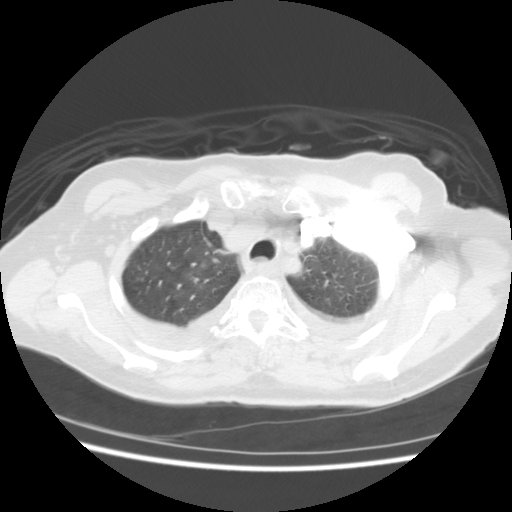
[im 203/215  mediastinal]
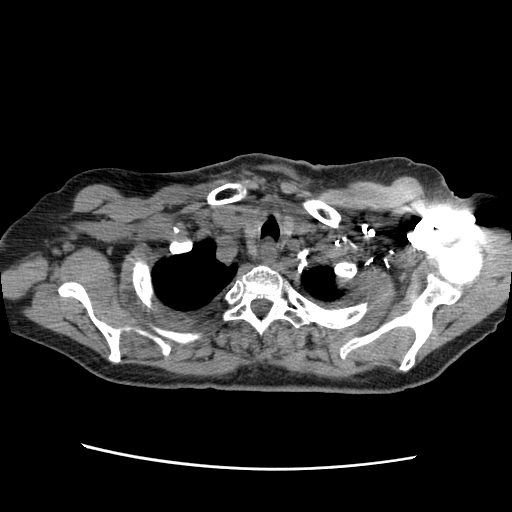

[Series 201: coronal chest · coronal · 0.64mm/px · 3 of 106 slices shown]
[im 22/106  mediastinal]
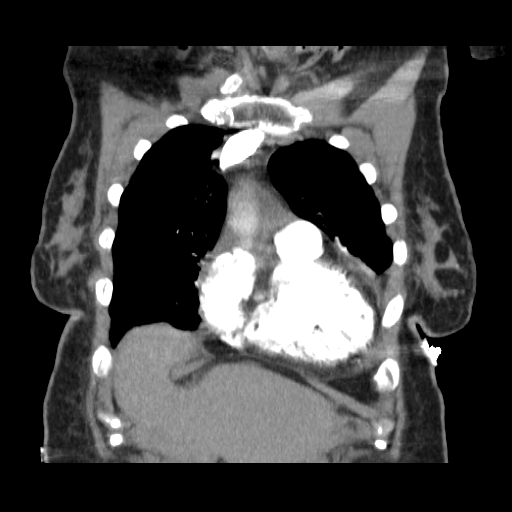
[im 43/106  mediastinal]
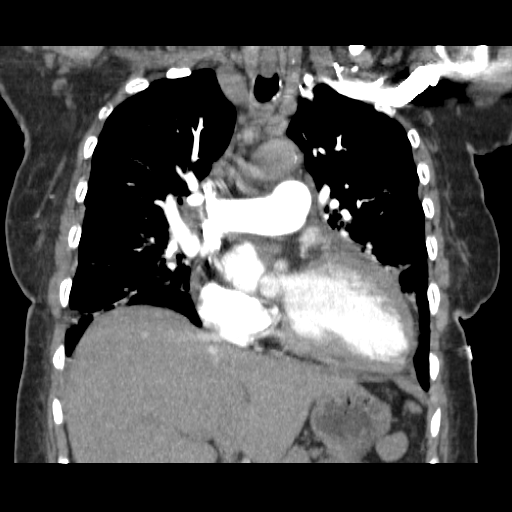
[im 64/106  mediastinal]
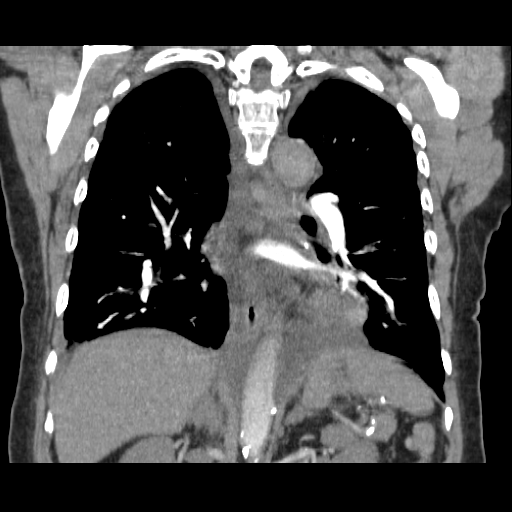

[19 of 36 positions shown; findings below may reference images not displayed]

FINDINGS: No CT evidence of pulmonary embolus.  Low attenuation lesion in the right lobe of the thyroid is nonspecific.  Mediastinal lymph nodes are increased in number and are small to borderline in size.  Lymph node anterior to the right main stem bronchus measures 1.3 cm and contains a fatty hilum.  There is prominent right hilar lymphoid tissue.  Pulmonary arteries are enlarged with the main pulmonary artery measuring 3.4 cm.  Cardiac size is increased.  No pericardial fluid.  
 Large bilateral pleural effusions.  Septal thickening and ground-glass are seen in both lungs.  Bibasilar atelectasis.  Airway unremarkable. 
 Incidental imaging of the upper abdomen shows a tiny low-attenuation lesion off the right kidney which is too small to characterize.  No worrisome lytic or sclerotic lesions.
IMPRESSION: 1.  No pulmonary embolus. 
 2.  Congestive heart failure and pulmonary arterial hypertension.
 3.  Increased number of small to borderline enlarged mediastinal and right hilar lymph nodes.

## 2007-02-07 IMAGING — CR DG CHEST 1V PORT
1 series · 1 of 1 positions shown · non-contrast
Comparison: 11/19/06.

CLINICAL DATA: 83-year-old female.  Respiratory distress. 
 PORTABLE CHEST ? 1 VIEW:

[view not recorded]
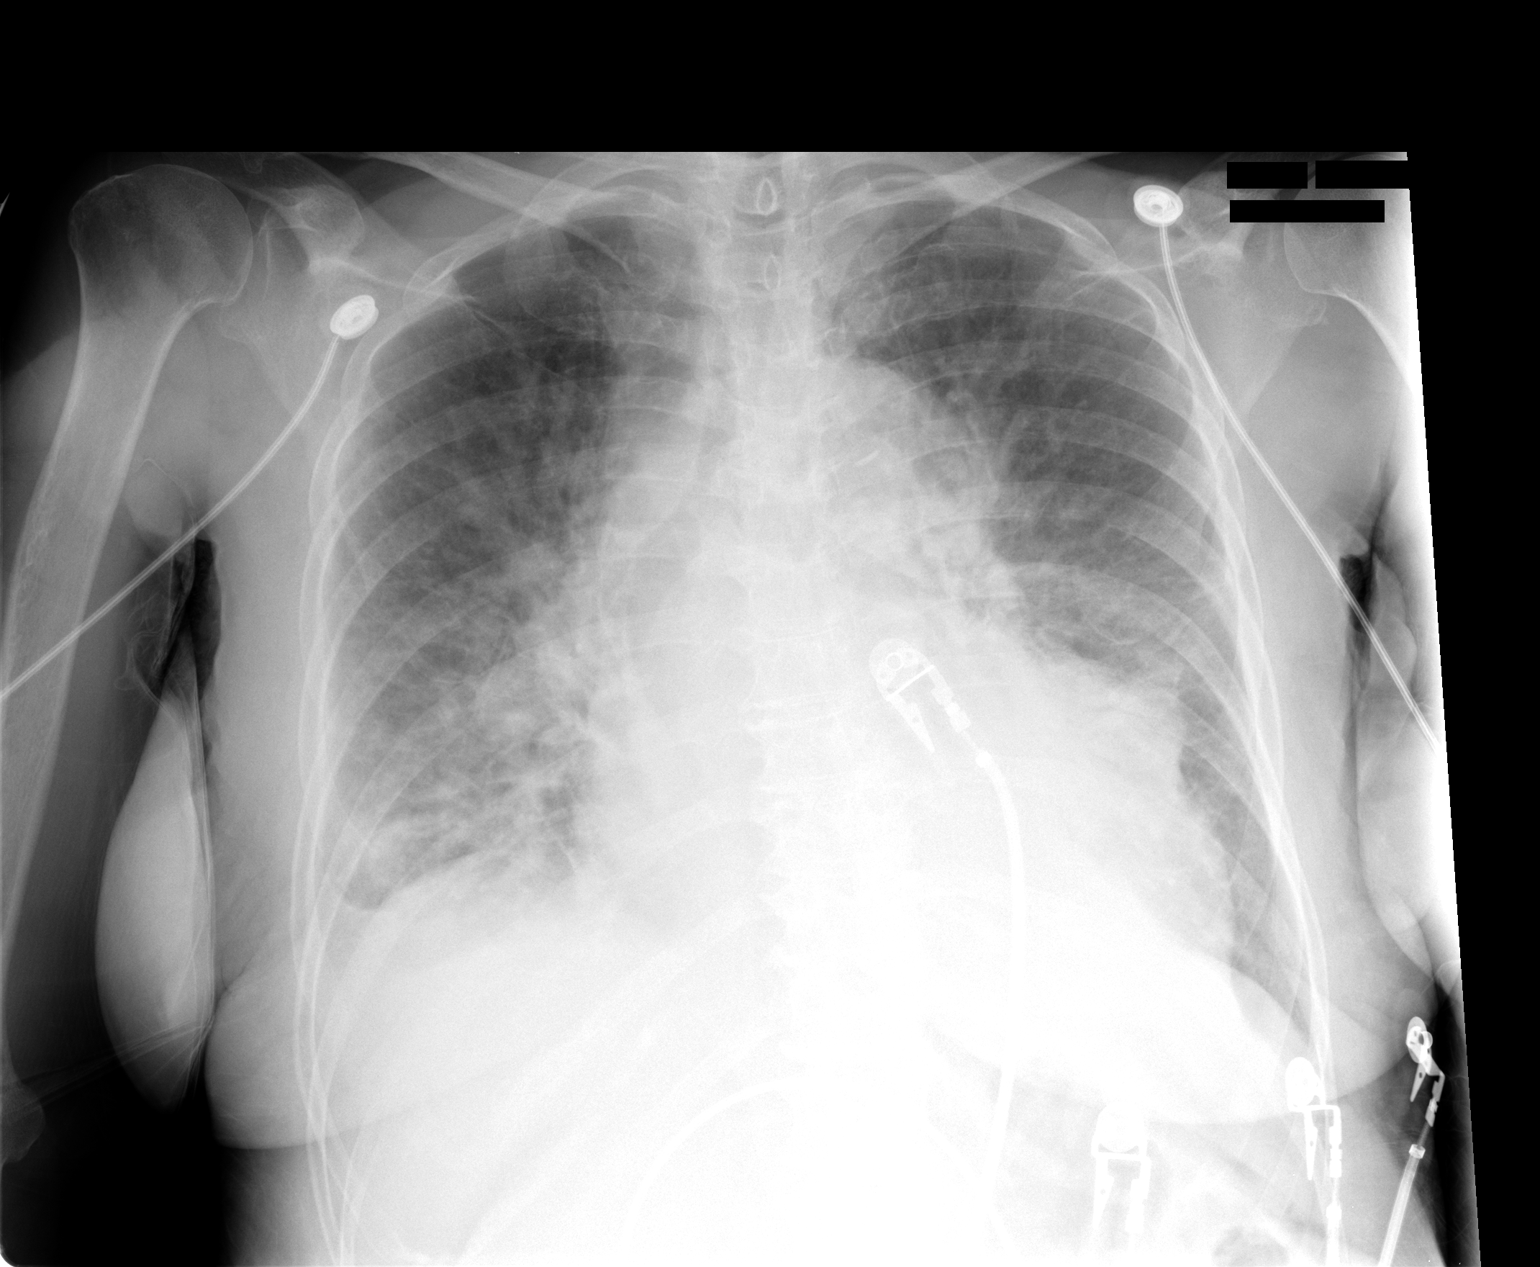

[1 of 1 positions shown; findings below may reference images not displayed]

FINDINGS: Cardiopericardial silhouette remains enlarged.  There is slight increase in pleural effusion.  Diffuse interstitial pattern has developed in the interval.  No focal air space disease is seen.  The right PICC line has been removed.
IMPRESSION: 1.  Cardiomegaly with new diffuse interstitial pattern worrisome for edema and failure. 
 2.  Slight increased right pleural effusion and air space disease.  
 3.  These findings were called to Dr. Dibya Ranjan at 4300 a.m. 12/17/06.

## 2007-02-08 ENCOUNTER — Encounter: Payer: Self-pay | Admitting: Family Medicine

## 2007-02-08 IMAGING — CR DG CHEST 1V PORT
1 series · 1 of 1 positions shown · non-contrast
Comparison: 12/17/06.

CLINICAL DATA: Dyspnea.
 PORTABLE CHEST - 1 VIEW:

[view not recorded]
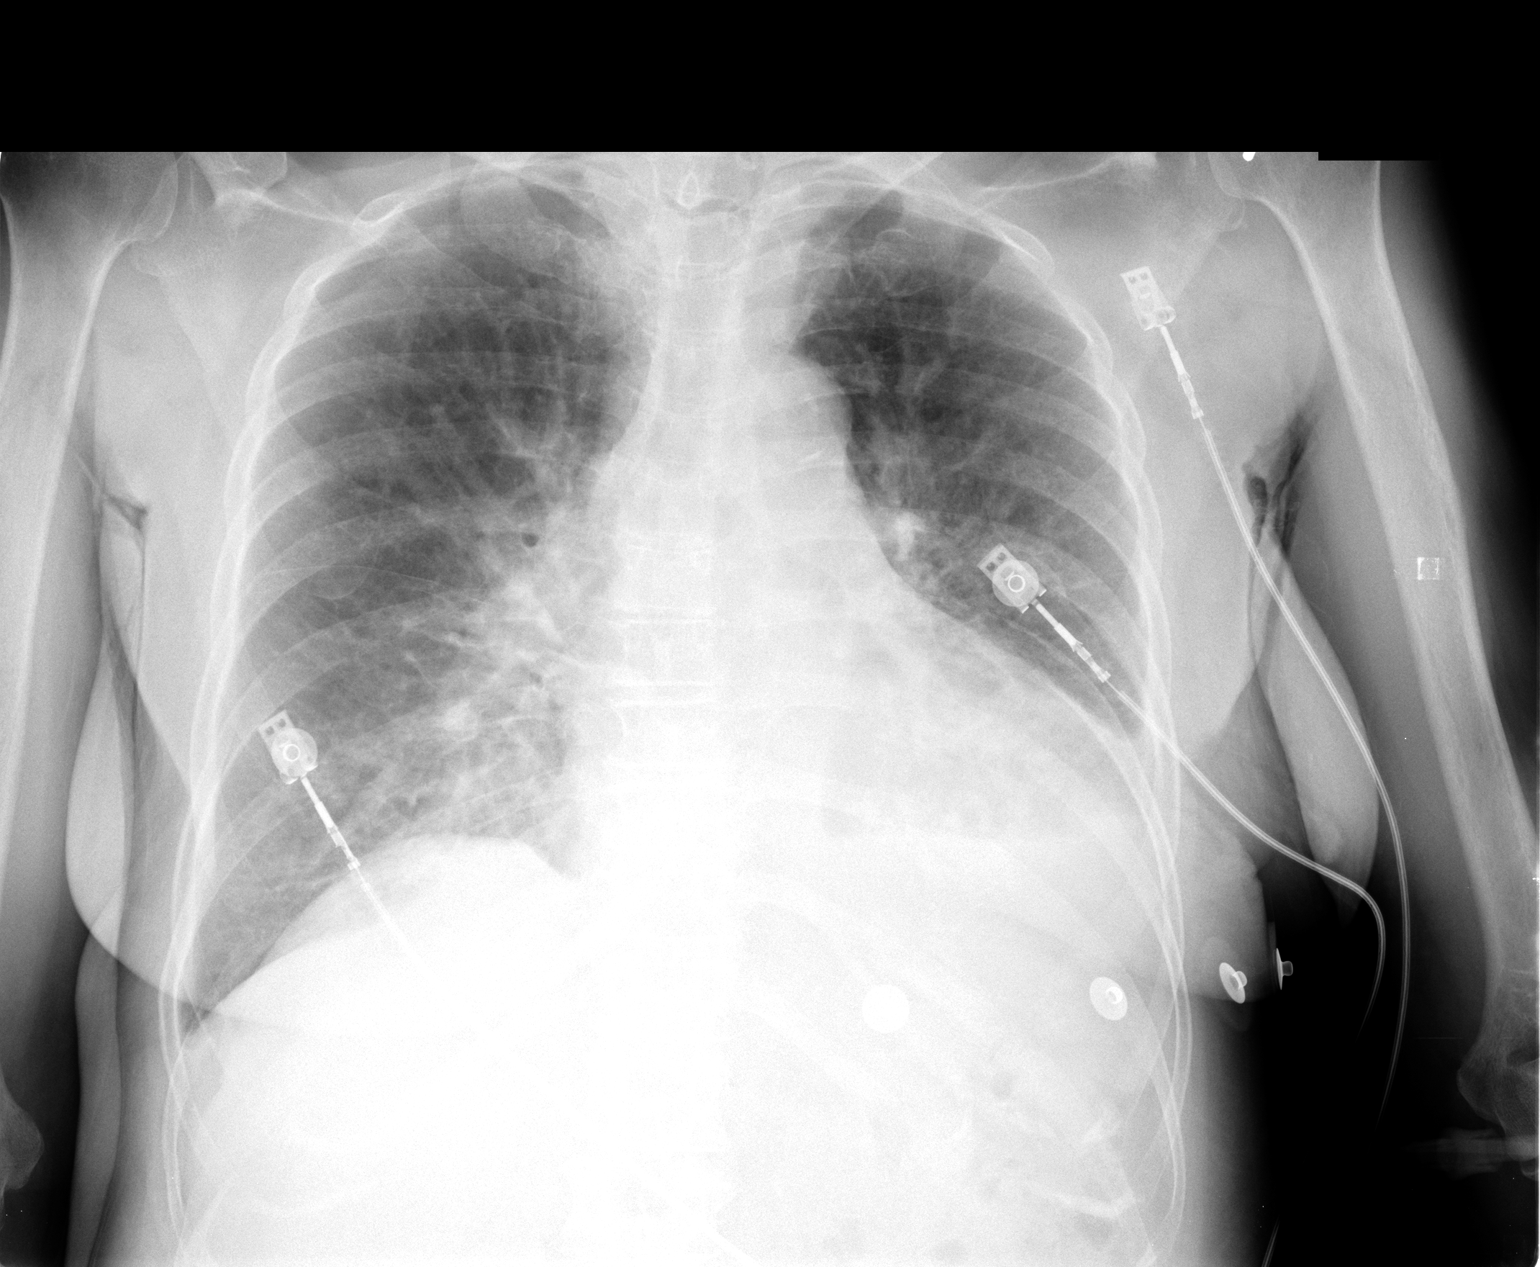

[1 of 1 positions shown; findings below may reference images not displayed]

FINDINGS: Interval decrease in perihilar edema.  Right effusion resolved.  No new findings.
IMPRESSION: Interval decrease in congestive heart failure/pulmonary edema.

## 2007-02-14 ENCOUNTER — Encounter: Payer: Self-pay | Admitting: Family Medicine

## 2007-02-20 ENCOUNTER — Ambulatory Visit: Payer: Self-pay | Admitting: Family Medicine

## 2007-02-28 ENCOUNTER — Encounter: Payer: Self-pay | Admitting: Family Medicine

## 2007-03-07 ENCOUNTER — Encounter: Admission: RE | Admit: 2007-03-07 | Discharge: 2007-03-07 | Payer: Self-pay

## 2007-03-07 ENCOUNTER — Encounter: Payer: Self-pay | Admitting: Family Medicine

## 2007-03-11 ENCOUNTER — Encounter: Payer: Self-pay | Admitting: Family Medicine

## 2007-03-28 ENCOUNTER — Ambulatory Visit: Payer: Self-pay | Admitting: Sports Medicine

## 2007-03-28 ENCOUNTER — Encounter: Payer: Self-pay | Admitting: Family Medicine

## 2007-03-28 LAB — CONVERTED CEMR LAB
Glucose, Bld: 174 mg/dL — ABNORMAL HIGH (ref 70–99)
HCT: 31.7 %
Hgb A1c MFr Bld: 6.7 %
Platelets: 271 10*3/uL
Potassium: 4.4 meq/L (ref 3.5–5.3)
RBC: 4.35 M/uL
Sodium: 140 meq/L (ref 135–145)

## 2007-03-29 ENCOUNTER — Telehealth: Payer: Self-pay | Admitting: *Deleted

## 2007-04-01 ENCOUNTER — Ambulatory Visit: Payer: Self-pay | Admitting: Family Medicine

## 2007-04-01 LAB — CONVERTED CEMR LAB
Bilirubin Urine: NEGATIVE
Glucose, Urine, Semiquant: NEGATIVE
Ketones, urine, test strip: NEGATIVE
Specific Gravity, Urine: 1.02
Whiff Test: NEGATIVE
pH: 7

## 2007-04-22 ENCOUNTER — Encounter: Payer: Self-pay | Admitting: Family Medicine

## 2007-04-24 ENCOUNTER — Emergency Department (HOSPITAL_COMMUNITY): Admission: EM | Admit: 2007-04-24 | Discharge: 2007-04-24 | Payer: Self-pay | Admitting: Emergency Medicine

## 2007-04-25 ENCOUNTER — Telehealth: Payer: Self-pay | Admitting: *Deleted

## 2007-04-29 ENCOUNTER — Ambulatory Visit: Payer: Self-pay | Admitting: Oncology

## 2007-04-29 ENCOUNTER — Encounter: Payer: Self-pay | Admitting: Family Medicine

## 2007-04-29 ENCOUNTER — Ambulatory Visit: Payer: Self-pay | Admitting: Family Medicine

## 2007-04-29 DIAGNOSIS — D509 Iron deficiency anemia, unspecified: Secondary | ICD-10-CM | POA: Insufficient documentation

## 2007-04-29 LAB — CONVERTED CEMR LAB
Ferritin: 374 ng/mL — ABNORMAL HIGH (ref 10–291)
Saturation Ratios: 23 % (ref 20–55)

## 2007-04-30 ENCOUNTER — Encounter: Payer: Self-pay | Admitting: Family Medicine

## 2007-04-30 ENCOUNTER — Encounter: Payer: Self-pay | Admitting: *Deleted

## 2007-04-30 ENCOUNTER — Telehealth: Payer: Self-pay | Admitting: *Deleted

## 2007-05-04 ENCOUNTER — Telehealth: Payer: Self-pay | Admitting: Family Medicine

## 2007-05-28 ENCOUNTER — Telehealth (INDEPENDENT_AMBULATORY_CARE_PROVIDER_SITE_OTHER): Payer: Self-pay | Admitting: *Deleted

## 2007-05-29 ENCOUNTER — Ambulatory Visit: Payer: Self-pay | Admitting: Family Medicine

## 2007-05-29 ENCOUNTER — Encounter (INDEPENDENT_AMBULATORY_CARE_PROVIDER_SITE_OTHER): Payer: Self-pay | Admitting: *Deleted

## 2007-05-29 LAB — CONVERTED CEMR LAB: Hgb A1c MFr Bld: 6.8 %

## 2007-05-30 LAB — CONVERTED CEMR LAB
Basophils Absolute: 0 10*3/uL (ref 0.0–0.1)
Basophils Relative: 0 % (ref 0–1)
CO2: 21 meq/L (ref 19–32)
Chloride: 112 meq/L (ref 96–112)
Creatinine, Ser: 0.98 mg/dL (ref 0.40–1.20)
Hemoglobin: 10.4 g/dL — ABNORMAL LOW (ref 12.0–15.0)
Lymphocytes Relative: 37 % (ref 12–46)
Monocytes Absolute: 0.4 10*3/uL (ref 0.2–0.7)
Monocytes Relative: 6 % (ref 3–11)
Neutro Abs: 3.3 10*3/uL (ref 1.7–7.7)
Neutrophils Relative %: 56 % (ref 43–77)
Potassium: 4 meq/L (ref 3.5–5.3)
RBC: 4.51 M/uL (ref 3.87–5.11)
RDW: 17.3 % — ABNORMAL HIGH (ref 11.5–14.0)

## 2007-05-31 ENCOUNTER — Encounter: Payer: Self-pay | Admitting: *Deleted

## 2007-06-14 ENCOUNTER — Ambulatory Visit: Payer: Self-pay | Admitting: Family Medicine

## 2007-06-15 IMAGING — CT CT ANGIO CHEST
2 of 3 series · 19 of 36 positions shown · IV contrast (APPLIED)
Comparison: none

CLINICAL DATA: Shortness of breath and dizziness.  
CT ANGIOGRAPHY OF CHEST:
TECHNIQUE: Multidetector CT imaging of the chest was performed during bolus injection of intravenous contrast.  Multiplanar CT angiographic image reconstructions were generated to evaluate the vascular anatomy.
Contrast:  100 cc Omnipaque 300.

[Series 4: pulm embolism 2.0 b31f st · axial · 0.62mm/px · z∈[-304,-42]mm · 16 of 143 slices shown]
[im 6/143  lung]
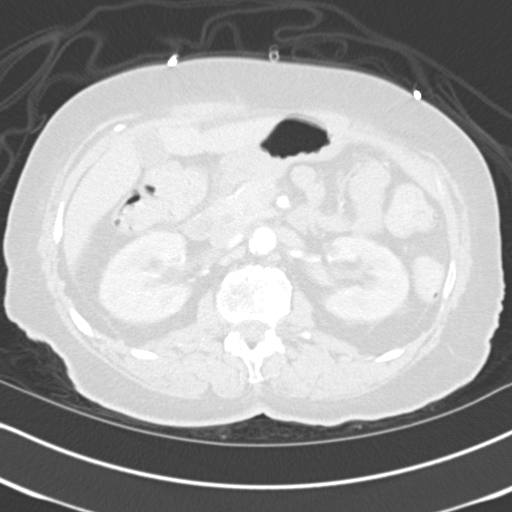
[im 16/143  mediastinal]
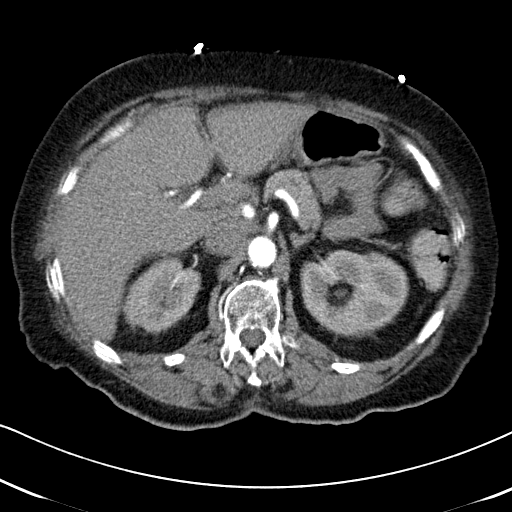
[im 27/143  lung]
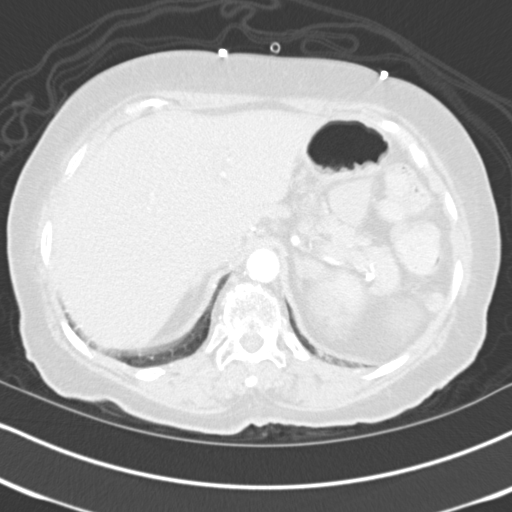
[im 32/143  mediastinal]
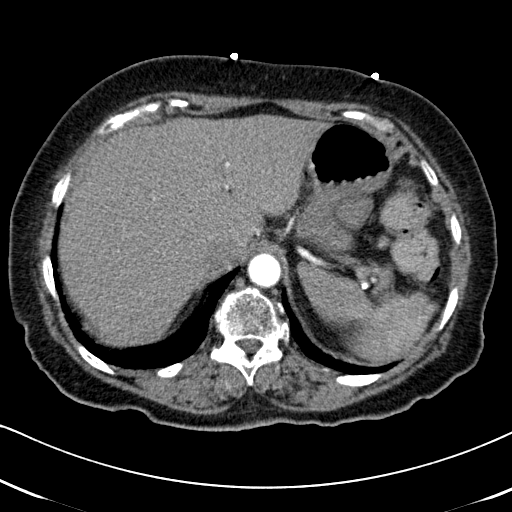
[im 43/143  lung]
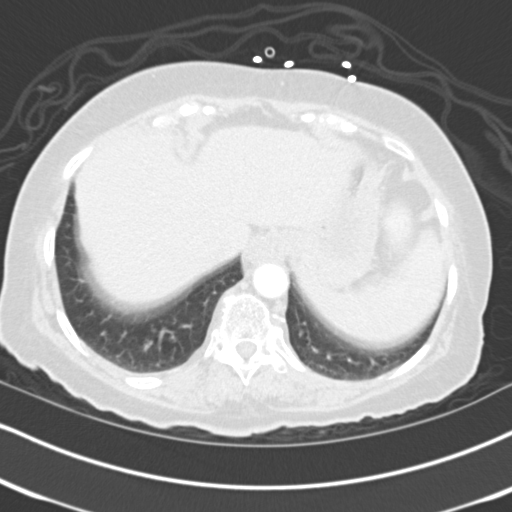
[im 48/143  mediastinal]
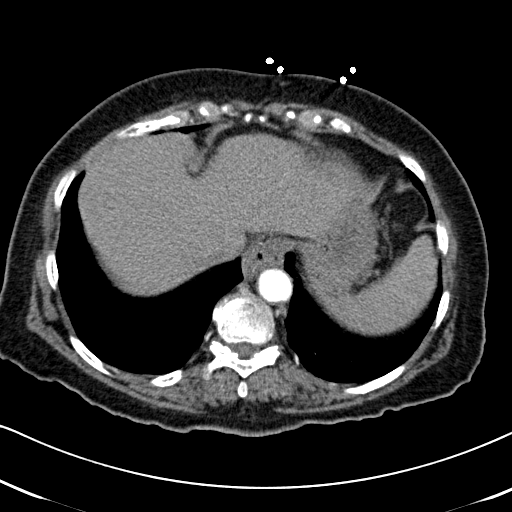
[im 58/143  lung]
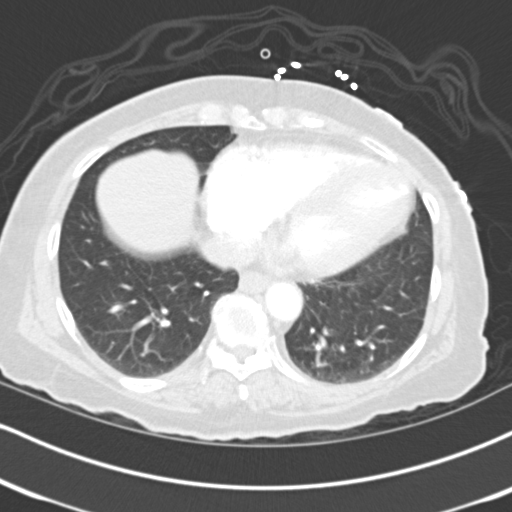
[im 69/143  mediastinal]
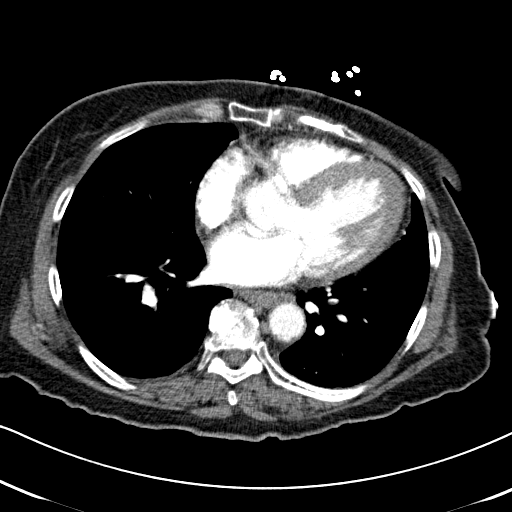
[im 74/143  lung]
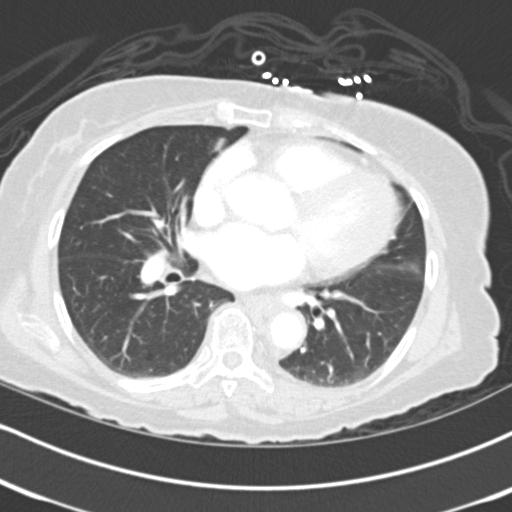
[im 85/143  mediastinal]
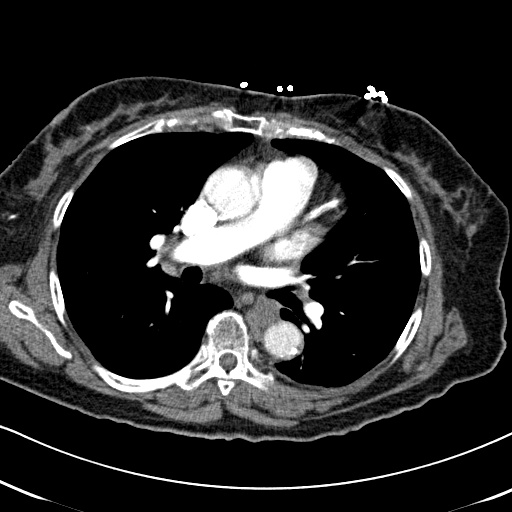
[im 95/143  lung]
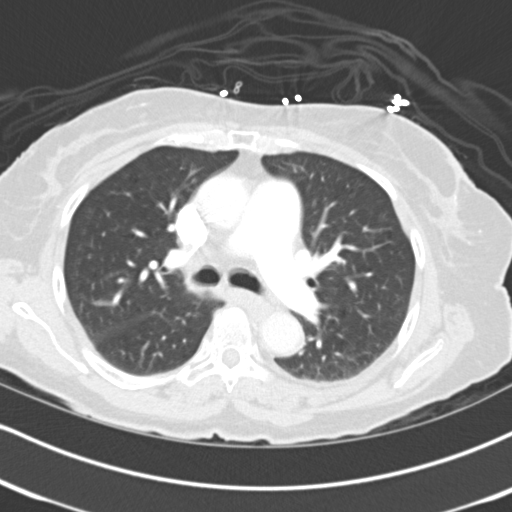
[im 100/143  mediastinal]
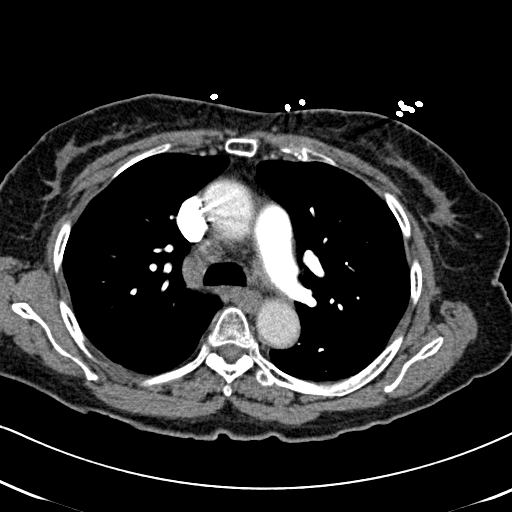
[im 111/143  lung]
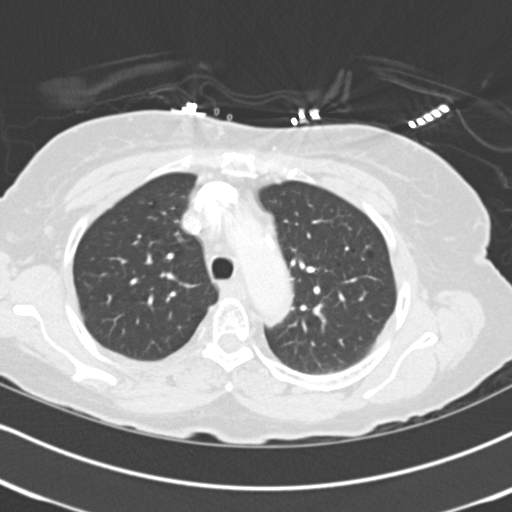
[im 116/143  mediastinal]
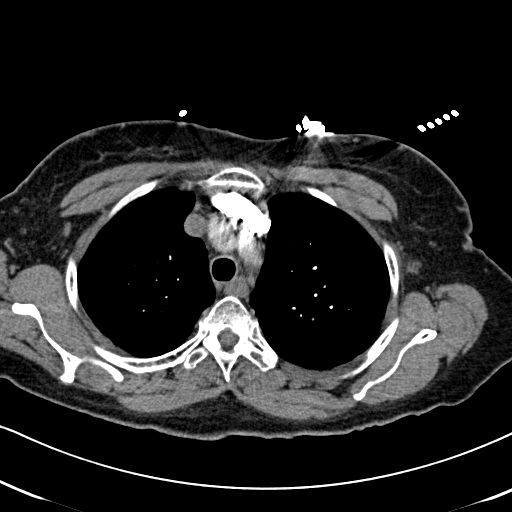
[im 127/143  lung]
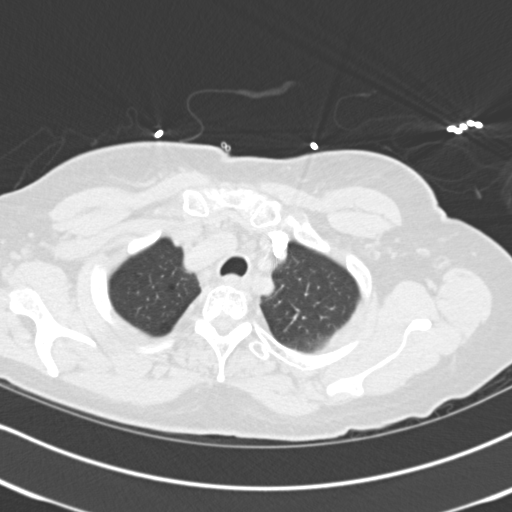
[im 137/143  mediastinal]
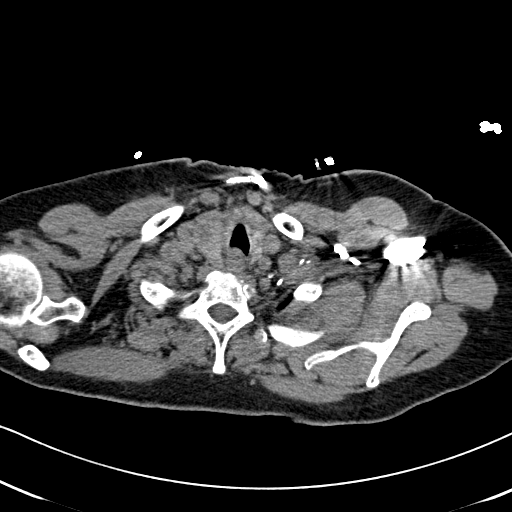

[Series 602: coronal pe · coronal · 0.62mm/px · 3 of 96 slices shown]
[im 20/96  mediastinal]
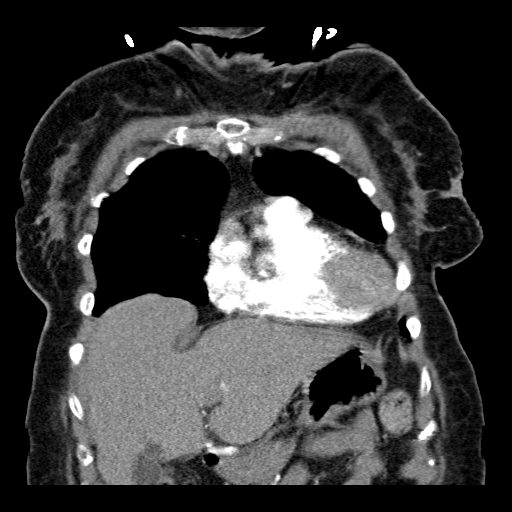
[im 39/96  mediastinal]
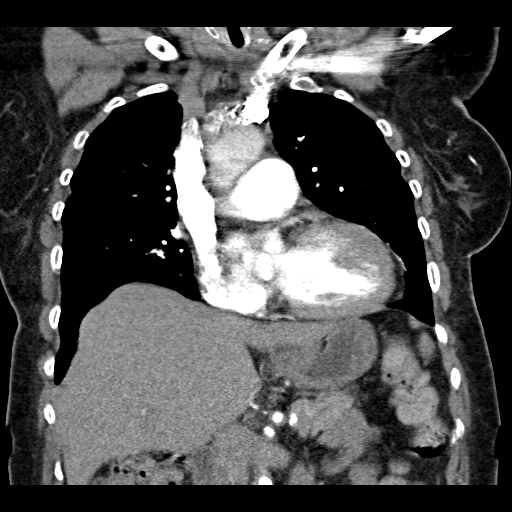
[im 58/96  mediastinal]
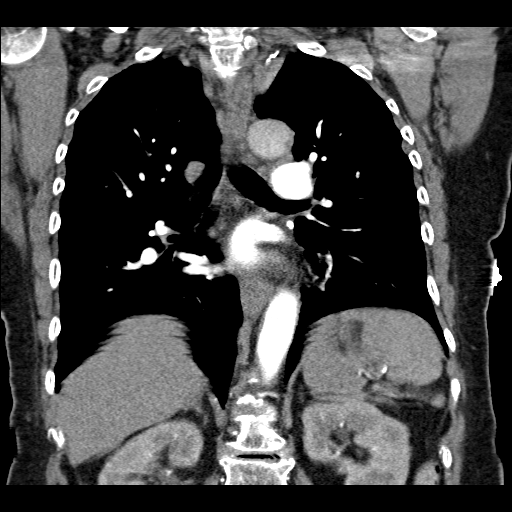

[19 of 36 positions shown; findings below may reference images not displayed]

FINDINGS: No evidence of pulmonary embolus.  Coronary artery calcifications are noted.  Mediastinal adenopathy, including pretracheal lymph node, measuring 1.6 x 1.4 cm (series 4, image 45).  ight hilar adenopathy measuring 2.2 x 2 cm (series 4, image 57) is noted.  Etiology is indeterminate, as a discrete lung lesion is not identified.  One could further evaluate this adenopathy on followup.  Slight thickening of the minor fissure is noted.  Small hiatal hernia.  Upper abdominal structures are grossly within normal limits.  Degenerative changes throughout the thoracic and lumbar spine.
IMPRESSION: 1. No pulmonary embolus.
2. Mediastinal and right hilar adenopathy.  Etiology indeterminate.
3. Small hiatal hernia.

## 2007-06-15 IMAGING — CR DG CHEST 2V
2 series · 2 of 2 positions shown · non-contrast
Comparison: 12/18/06.

CLINICAL DATA: Shortness of breath. Lightheaded.
CHEST - 2 VIEW:

[w chest lat]
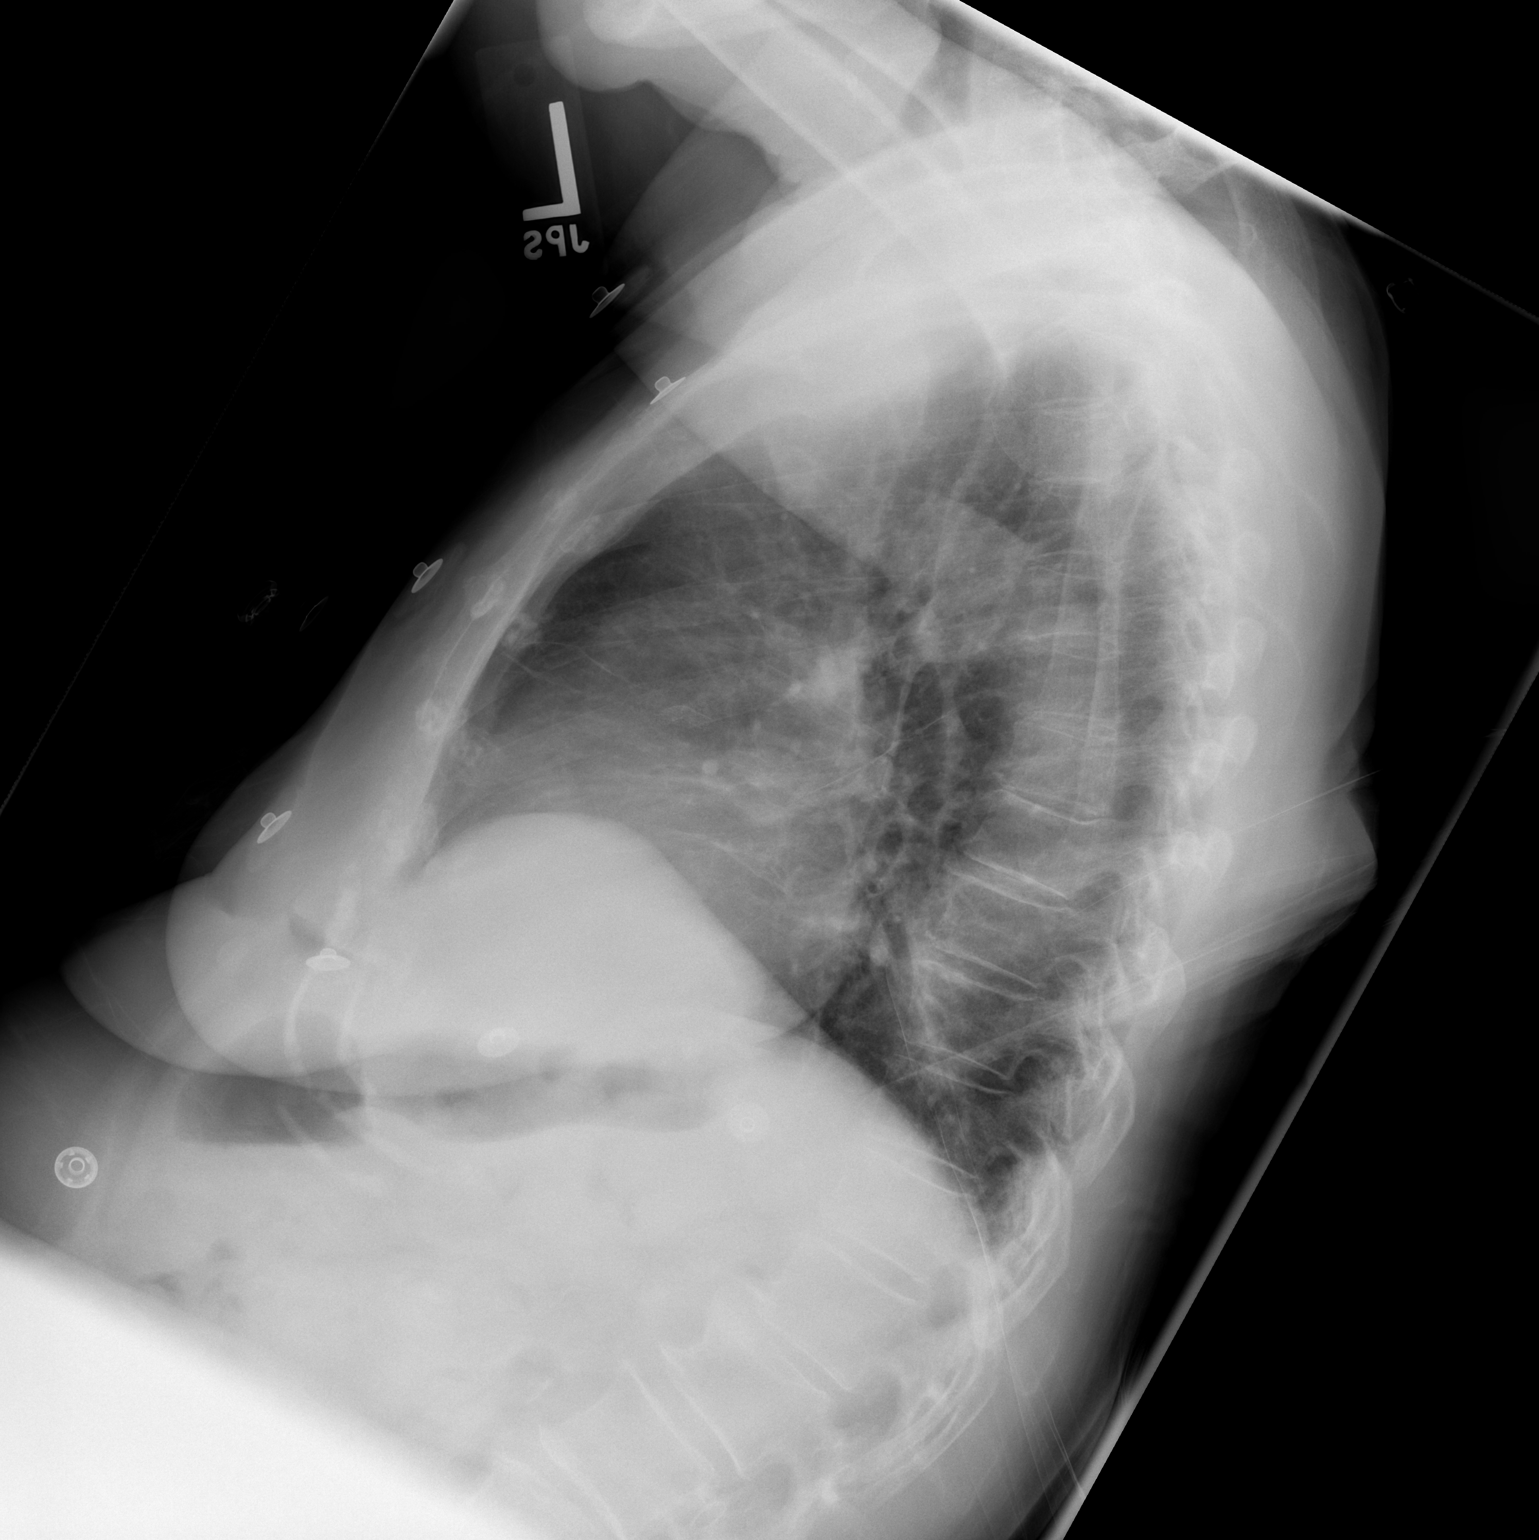

[w chest pa]
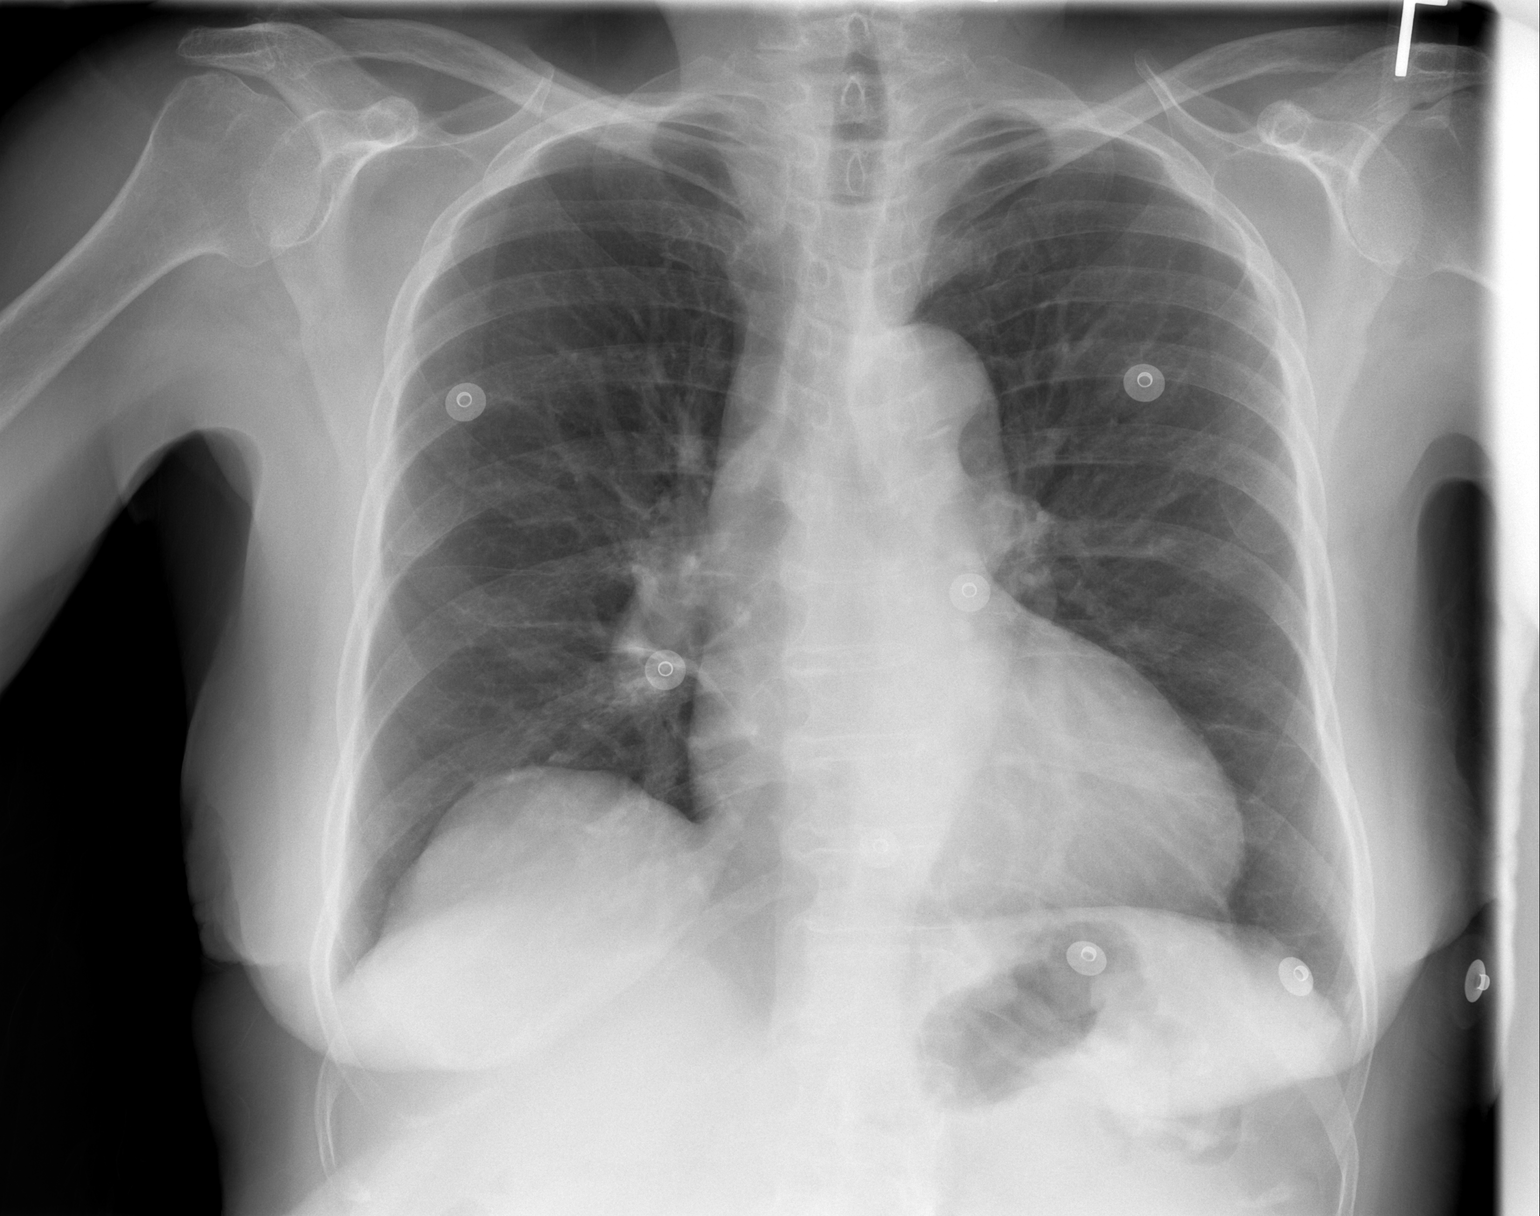

[2 of 2 positions shown; findings below may reference images not displayed]

FINDINGS: The heart is enlarged. There is no heart failure.  Negative for infiltrate or effusion. 
Chronic compression fracture at L1 is similar to the study of 08/18/06.
IMPRESSION: Cardiac enlargement.  No acute abnormality.

## 2007-06-20 ENCOUNTER — Ambulatory Visit: Payer: Self-pay | Admitting: Hematology and Oncology

## 2007-06-25 ENCOUNTER — Encounter (INDEPENDENT_AMBULATORY_CARE_PROVIDER_SITE_OTHER): Payer: Self-pay | Admitting: *Deleted

## 2007-06-25 LAB — CBC WITH DIFFERENTIAL/PLATELET
BASO%: 0.3 % (ref 0.0–2.0)
Eosinophils Absolute: 0.1 10*3/uL (ref 0.0–0.5)
HCT: 32.9 % — ABNORMAL LOW (ref 34.8–46.6)
LYMPH%: 32.6 % (ref 14.0–48.0)
MCHC: 33 g/dL (ref 32.0–36.0)
MONO#: 0.3 10*3/uL (ref 0.1–0.9)
NEUT#: 3.2 10*3/uL (ref 1.5–6.5)
Platelets: 265 10*3/uL (ref 145–400)
RBC: 4.45 10*6/uL (ref 3.70–5.32)
WBC: 5.3 10*3/uL (ref 3.9–10.0)
lymph#: 1.7 10*3/uL (ref 0.9–3.3)

## 2007-06-25 LAB — COMPREHENSIVE METABOLIC PANEL
ALT: <8 U/L (ref 0–35)
Albumin: 3.8 g/dL (ref 3.5–5.2)
CO2: 20 mEq/L (ref 19–32)
Calcium: 9.7 mg/dL (ref 8.4–10.5)
Chloride: 111 mEq/L (ref 96–112)
Glucose, Bld: 153 mg/dL — ABNORMAL HIGH (ref 70–99)
Sodium: 140 mEq/L (ref 135–145)
Total Bilirubin: 0.3 mg/dL (ref 0.3–1.2)
Total Protein: 7.2 g/dL (ref 6.0–8.3)

## 2007-06-25 LAB — IRON AND TIBC
Iron: 73 ug/dL (ref 42–145)
UIBC: 204 ug/dL

## 2007-07-06 ENCOUNTER — Ambulatory Visit: Payer: Self-pay | Admitting: Family Medicine

## 2007-07-06 ENCOUNTER — Inpatient Hospital Stay (HOSPITAL_COMMUNITY): Admission: EM | Admit: 2007-07-06 | Discharge: 2007-07-08 | Payer: Self-pay | Admitting: Emergency Medicine

## 2007-07-12 ENCOUNTER — Encounter (INDEPENDENT_AMBULATORY_CARE_PROVIDER_SITE_OTHER): Payer: Self-pay | Admitting: *Deleted

## 2007-07-12 ENCOUNTER — Ambulatory Visit: Payer: Self-pay | Admitting: Family Medicine

## 2007-07-17 ENCOUNTER — Encounter (INDEPENDENT_AMBULATORY_CARE_PROVIDER_SITE_OTHER): Payer: Self-pay | Admitting: *Deleted

## 2007-07-18 ENCOUNTER — Encounter (INDEPENDENT_AMBULATORY_CARE_PROVIDER_SITE_OTHER): Payer: Self-pay | Admitting: *Deleted

## 2007-08-01 ENCOUNTER — Emergency Department (HOSPITAL_COMMUNITY): Admission: EM | Admit: 2007-08-01 | Discharge: 2007-08-01 | Payer: Self-pay | Admitting: Emergency Medicine

## 2007-08-15 ENCOUNTER — Telehealth (INDEPENDENT_AMBULATORY_CARE_PROVIDER_SITE_OTHER): Payer: Self-pay | Admitting: *Deleted

## 2007-08-20 ENCOUNTER — Encounter: Payer: Self-pay | Admitting: *Deleted

## 2007-08-20 ENCOUNTER — Ambulatory Visit: Payer: Self-pay | Admitting: Family Medicine

## 2007-08-20 ENCOUNTER — Telehealth (INDEPENDENT_AMBULATORY_CARE_PROVIDER_SITE_OTHER): Payer: Self-pay | Admitting: *Deleted

## 2007-08-20 ENCOUNTER — Encounter (INDEPENDENT_AMBULATORY_CARE_PROVIDER_SITE_OTHER): Payer: Self-pay | Admitting: *Deleted

## 2007-08-20 LAB — CONVERTED CEMR LAB
Bilirubin Urine: NEGATIVE
Glucose, Urine, Semiquant: NEGATIVE
H Pylori IgG: NEGATIVE
Protein, U semiquant: 100
Specific Gravity, Urine: 1.025
Urobilinogen, UA: 2

## 2007-08-21 ENCOUNTER — Encounter (INDEPENDENT_AMBULATORY_CARE_PROVIDER_SITE_OTHER): Payer: Self-pay | Admitting: *Deleted

## 2007-08-22 ENCOUNTER — Ambulatory Visit (HOSPITAL_COMMUNITY): Admission: RE | Admit: 2007-08-22 | Discharge: 2007-08-22 | Payer: Self-pay | Admitting: *Deleted

## 2007-08-22 LAB — CONVERTED CEMR LAB
ALT: <8 units/L (ref 0–35)
AST: 12 units/L (ref 0–37)
Basophils Absolute: 0 10*3/uL (ref 0.0–0.1)
Basophils Relative: 1 % (ref 0–1)
CO2: 21 meq/L (ref 19–32)
Calcium: 9.7 mg/dL (ref 8.4–10.5)
Chloride: 107 meq/L (ref 96–112)
Creatinine, Ser: 0.93 mg/dL (ref 0.40–1.20)
Lymphocytes Relative: 32 % (ref 12–46)
MCHC: 31.2 g/dL (ref 30.0–36.0)
Monocytes Absolute: 0.3 10*3/uL (ref 0.2–0.7)
Neutro Abs: 2 10*3/uL (ref 1.7–7.7)
Neutrophils Relative %: 55 % (ref 43–77)
Platelets: 267 10*3/uL (ref 150–400)
RDW: 15 % — ABNORMAL HIGH (ref 11.5–14.0)
Sodium: 142 meq/L (ref 135–145)
Total Protein: 7.6 g/dL (ref 6.0–8.3)

## 2007-08-23 ENCOUNTER — Telehealth (INDEPENDENT_AMBULATORY_CARE_PROVIDER_SITE_OTHER): Payer: Self-pay | Admitting: *Deleted

## 2007-08-27 ENCOUNTER — Other Ambulatory Visit: Admission: RE | Admit: 2007-08-27 | Discharge: 2007-08-27 | Payer: Self-pay | Admitting: Family Medicine

## 2007-08-27 ENCOUNTER — Encounter (INDEPENDENT_AMBULATORY_CARE_PROVIDER_SITE_OTHER): Payer: Self-pay | Admitting: *Deleted

## 2007-08-27 ENCOUNTER — Ambulatory Visit: Payer: Self-pay | Admitting: Family Medicine

## 2007-08-27 IMAGING — CR DG CHEST 2V
1 series · 1 of 1 positions shown · non-contrast
Comparison: 04/24/2007 and 12/18/2006.

CLINICAL DATA: 83 year-old female with weakness and syncope.
 CHEST - 2 VIEW:

[view not recorded]
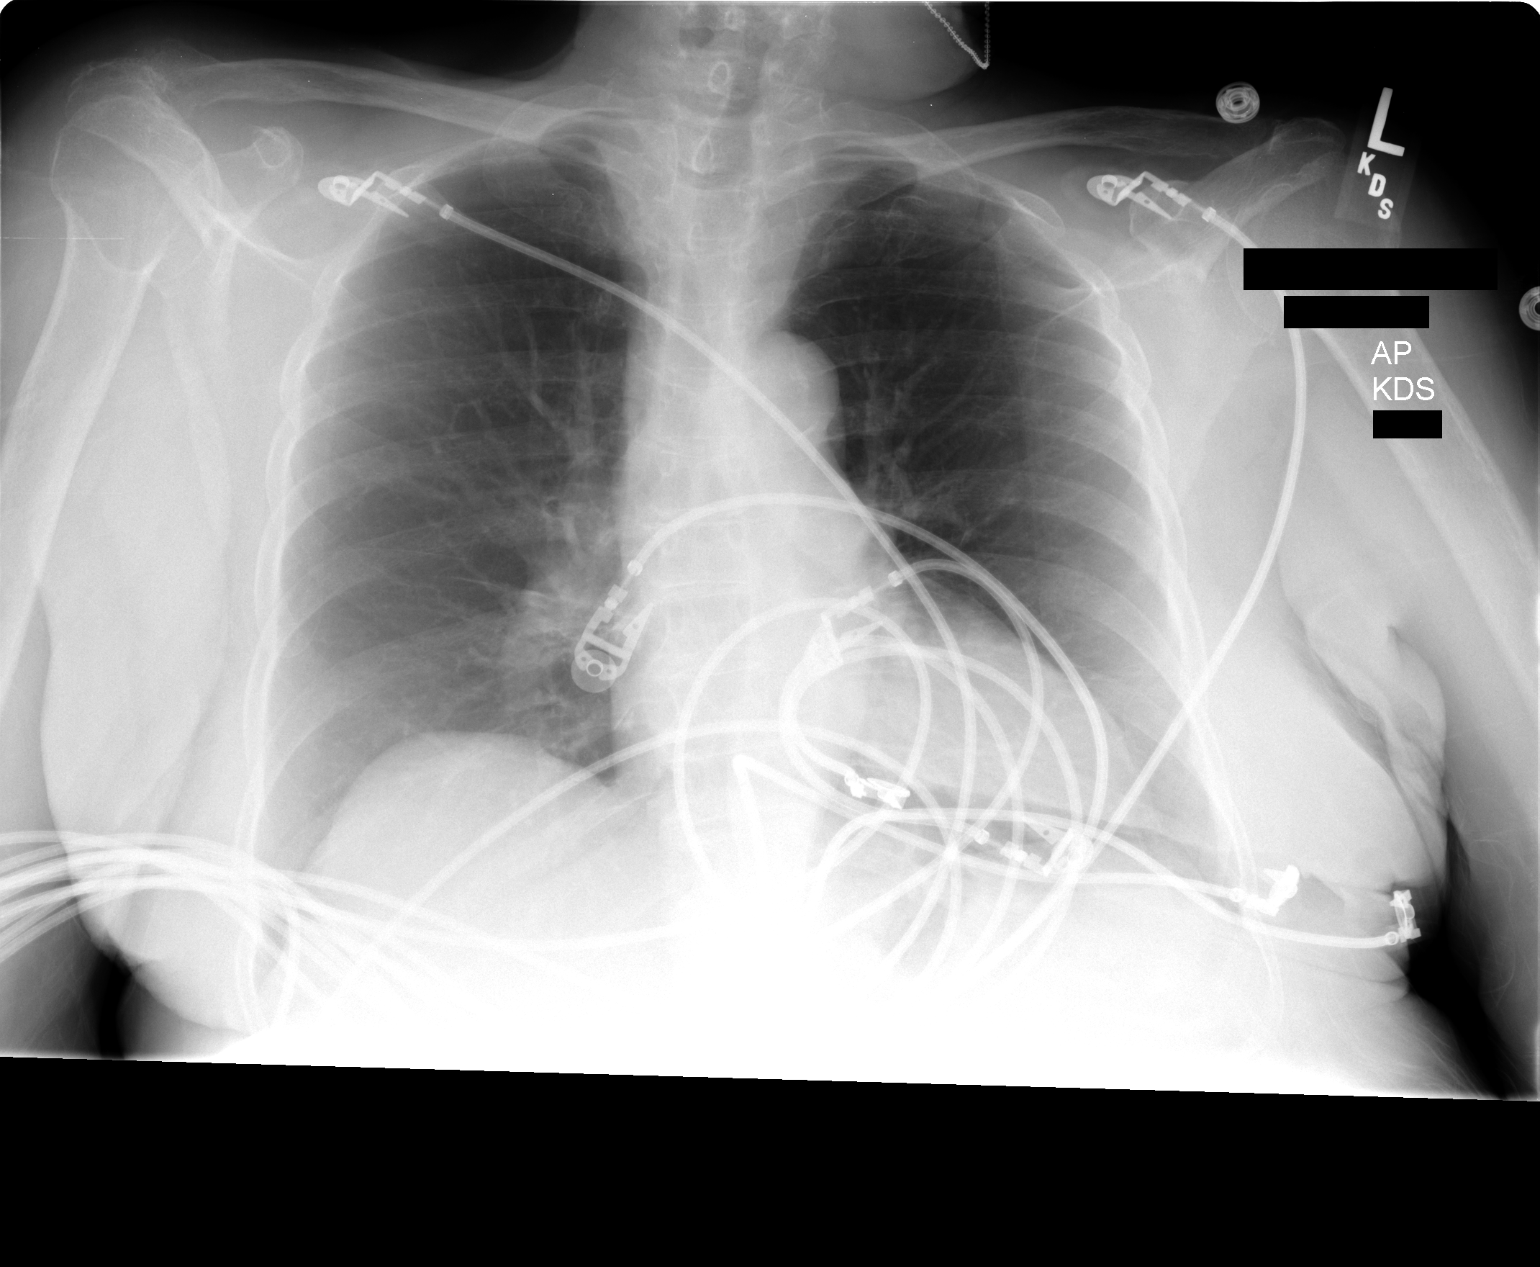

[1 of 1 positions shown; findings below may reference images not displayed]

FINDINGS: Telemetry leads overlie the chest.  Mild cardiac enlargement without CHF, pneumonia, effusion or pneumothorax.  Atherosclerosis of the aorta.  Degenerative changes of the spine.
IMPRESSION: Stable exam without acute chest finding.

## 2007-08-27 IMAGING — CT CT HEAD W/O CM
1 of 2 series · 13 of 30 positions shown, 17 images · IV contrast (agent unspecified)
Comparison: 07/10/2006 and 01/06/2006.

CLINICAL DATA: 83 year-old female with syncope and weakness.
HEAD CT WITHOUT CONTRAST:
TECHNIQUE: Contiguous axial images were obtained from the base of the skull through the vertex according to standard protocol without contrast.

[Series 2: brain · axial · 0.45mm/px · z∈[+118,+242]mm · 13 of 28 slices shown, 17 images]
[im 2/28  brain]
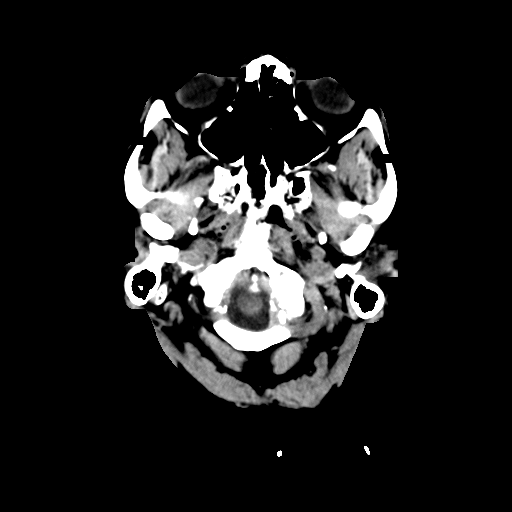
[im 2/28  bone]
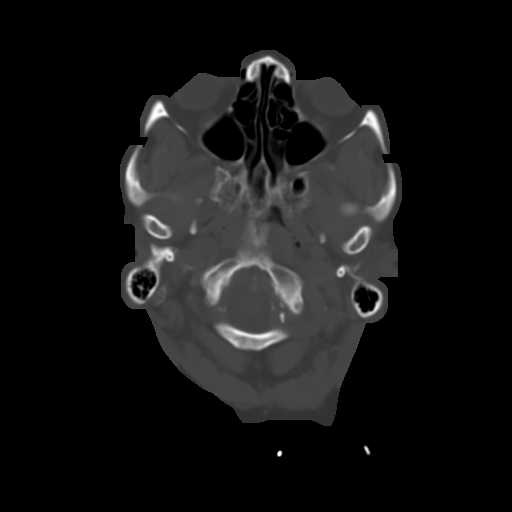
[im 4/28  brain]
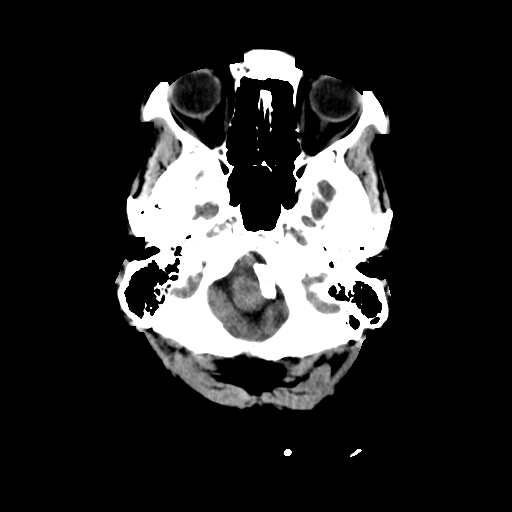
[im 6/28  brain]
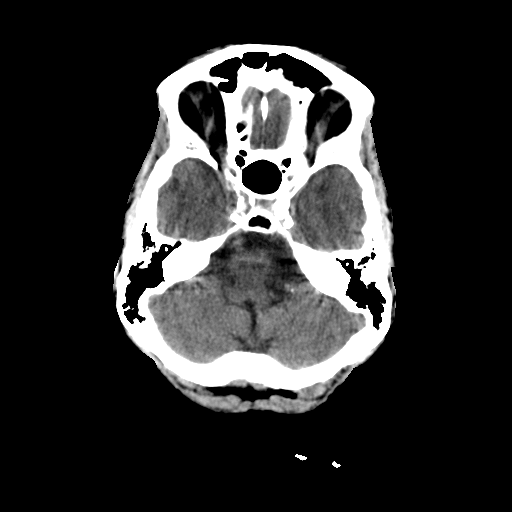
[im 8/28  brain]
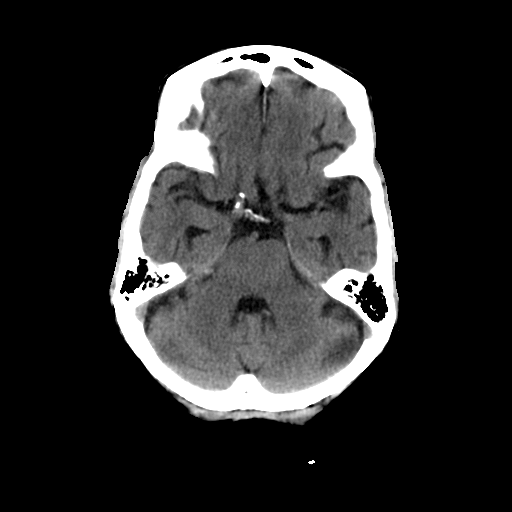
[im 10/28  brain]
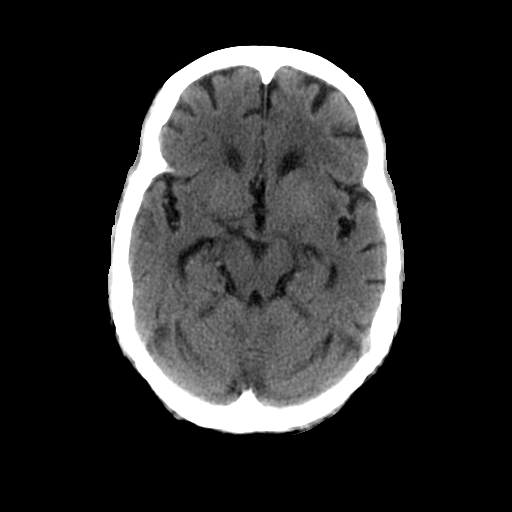
[im 10/28  bone]
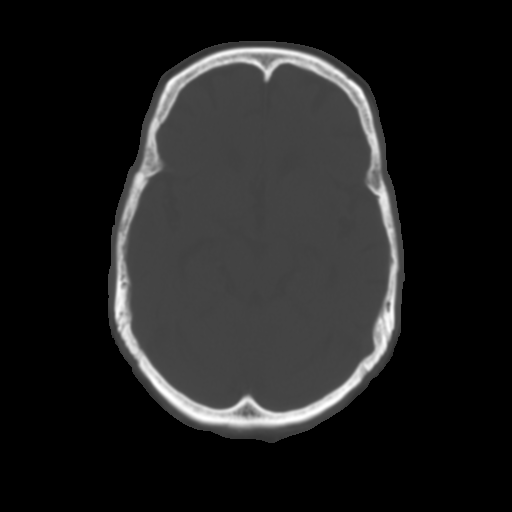
[im 12/28  brain]
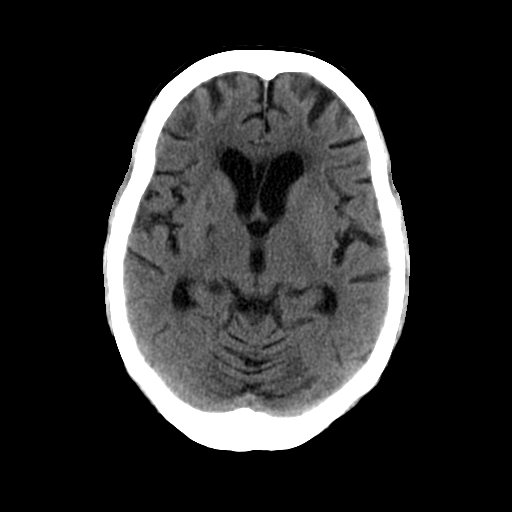
[im 14/28  brain]
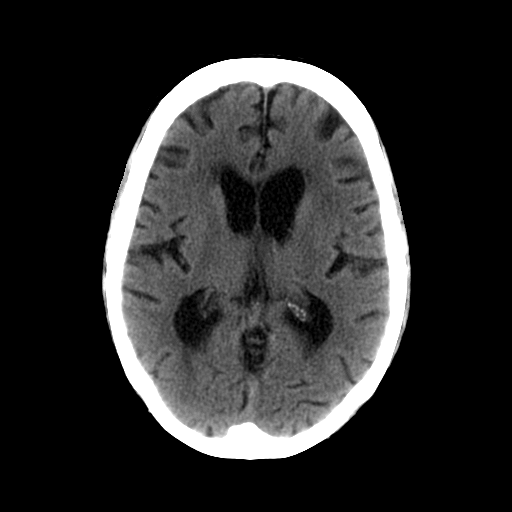
[im 16/28  brain]
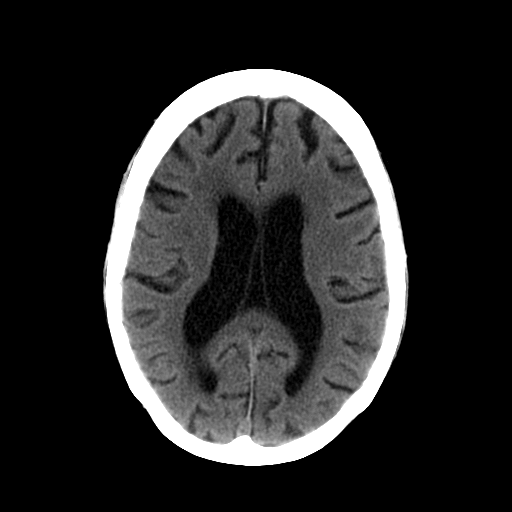
[im 18/28  brain]
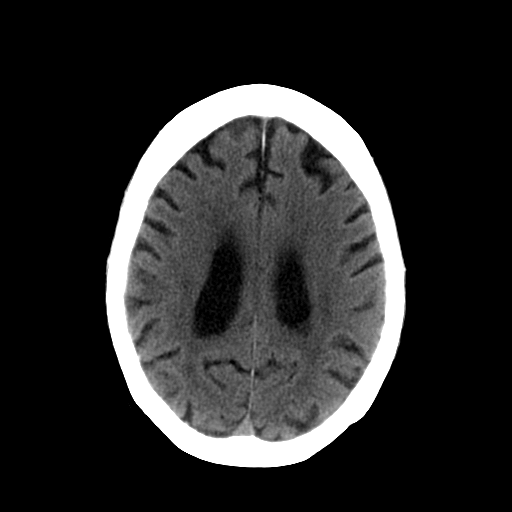
[im 18/28  bone]
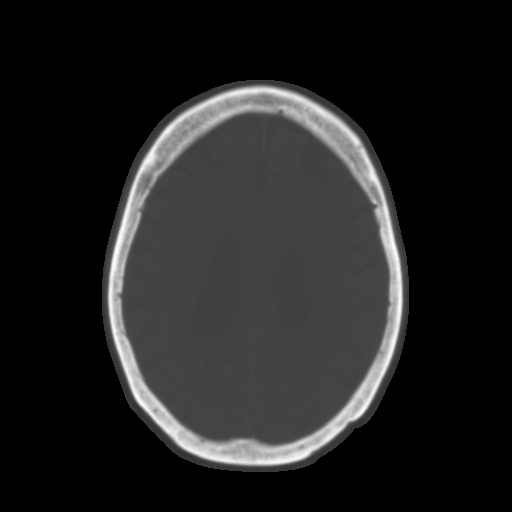
[im 20/28  brain]
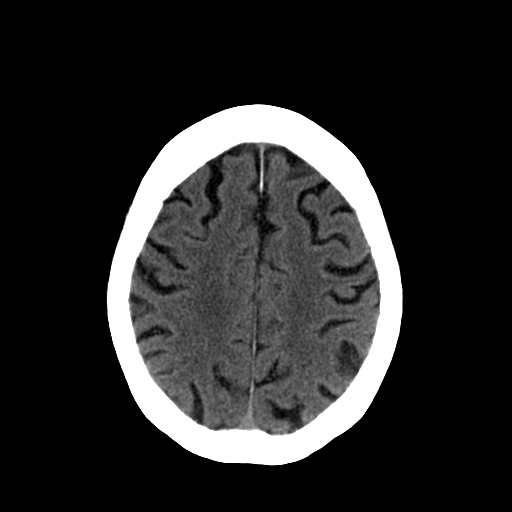
[im 22/28  brain]
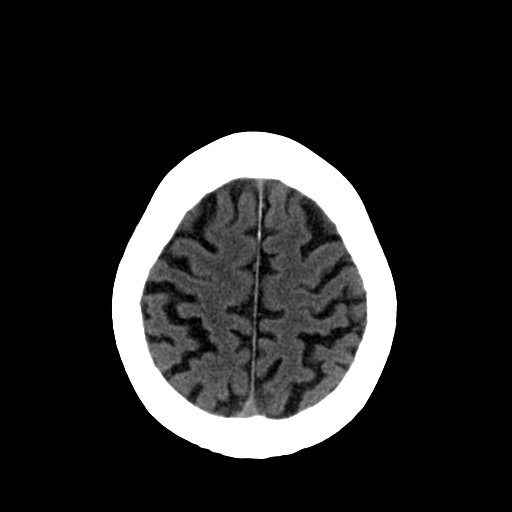
[im 24/28  brain]
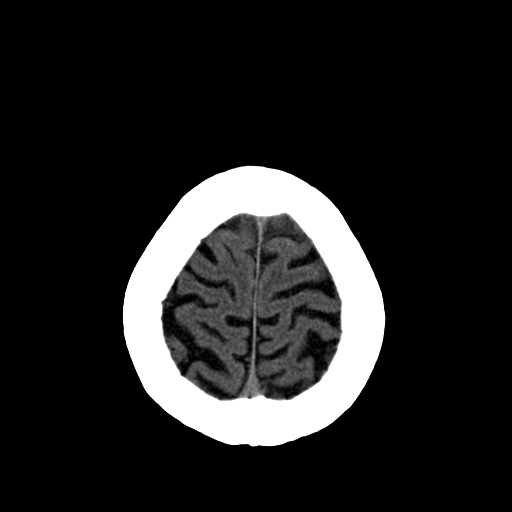
[im 26/28  brain]
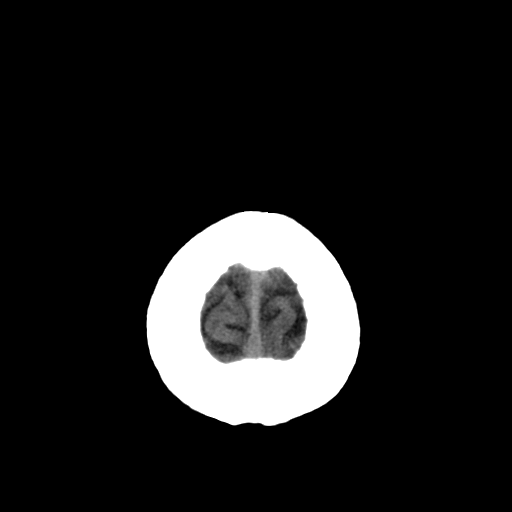
[im 26/28  bone]
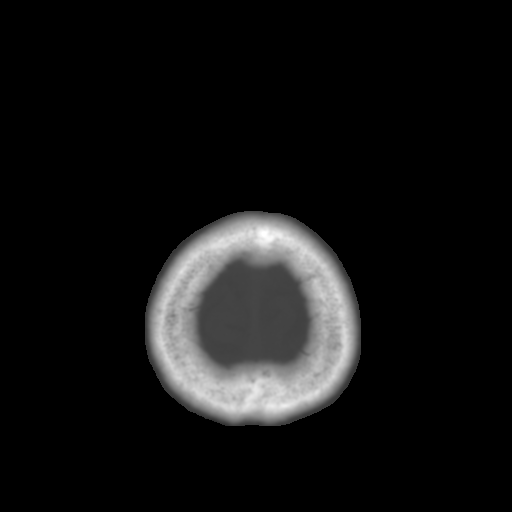

[13 of 30 positions shown; findings below may reference images not displayed]

FINDINGS: Global diffuse brain atrophy is noted of the cerebrum and the cerebellum.  Microvascular ischemic changes are present throughout the white matter.  The ventricles are mildly enlarged but stable.  Chronic lacunar-type infarctions are present in the right basal ganglia.  No definite acute infarction, hemorrhage, mass lesion, midline shift, herniation or extraaxial fluid collection by non-contrast CT.  Cisterns are patent.  Atherosclerosis of the intracranial vasculature.  Mastoids and sinuses visualized are clear.
IMPRESSION: Stable brain atrophy and microvascular ischemic changes.  No interval change or acute finding.

## 2007-09-12 ENCOUNTER — Encounter (INDEPENDENT_AMBULATORY_CARE_PROVIDER_SITE_OTHER): Payer: Self-pay | Admitting: *Deleted

## 2007-09-27 ENCOUNTER — Ambulatory Visit: Payer: Self-pay | Admitting: Family Medicine

## 2007-09-27 LAB — CONVERTED CEMR LAB: Hgb A1c MFr Bld: 6.6 %

## 2007-10-13 IMAGING — CT CT CHEST W/ CM
2 of 7 series · 12 of 36 positions shown, 17 images · IV contrast (READICAT)
Comparison: 04/14/2007

CLINICAL DATA: Abdominal pain.  History of colonic cancer 
CHEST CT WITH CONTRAST:
TECHNIQUE: Multidetector CT imaging of the chest was performed following the standard protocol during bolus administration of intravenous contrast.
Contrast:  100 cc Omnipaque 300
TECHNIQUE: Multidetector CT imaging of the abdomen was performed following the standard protocol during bolus administration of intravenous contrast.
Comparison examination was performed 10/19/2006.
TECHNIQUE: Multidetector CT imaging of the pelvis was performed following the standard protocol during bolus administration of intravenous contrast.

[Series 2: chest/abd/pelvis · axial · 0.70mm/px · z∈[-502,-201]mm · 6 of 138 slices shown]
[im 16/138  soft-tissue]
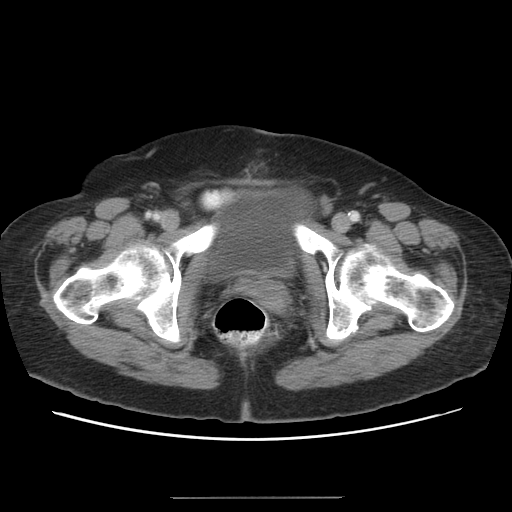
[im 31/138  soft-tissue]
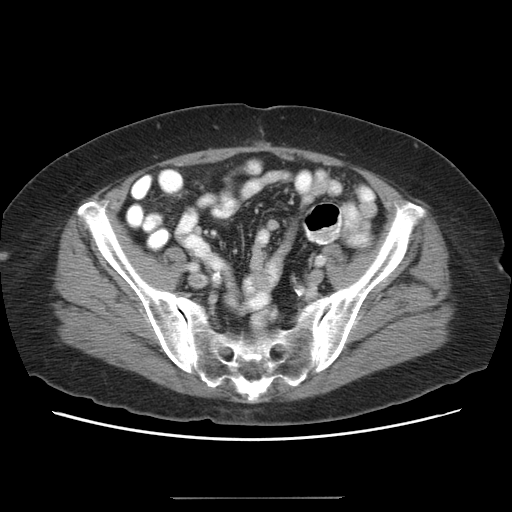
[im 46/138  soft-tissue]
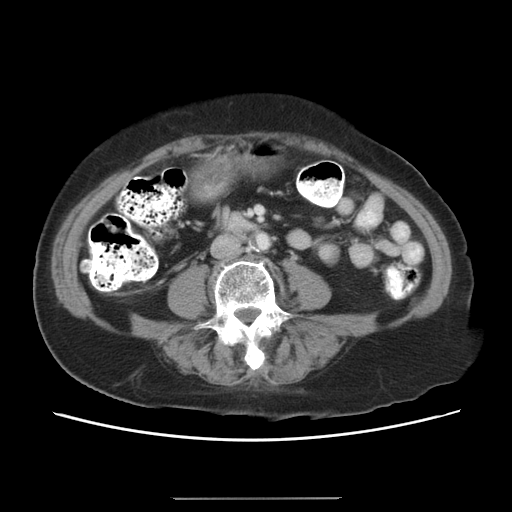
[im 61/138  soft-tissue]
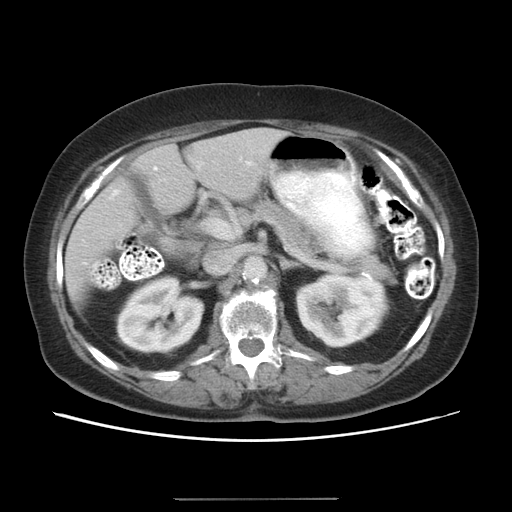
[im 77/138  soft-tissue]
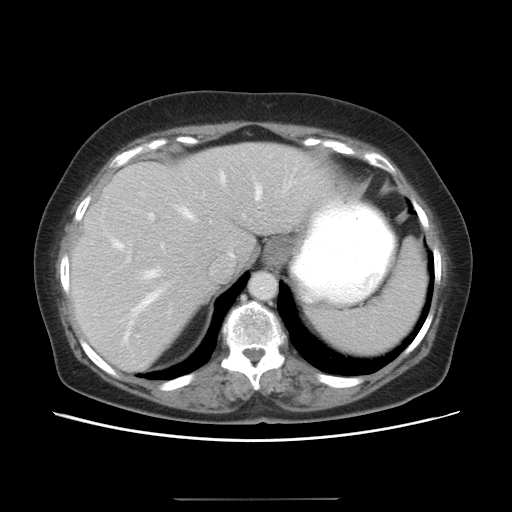
[im 92/138  soft-tissue]
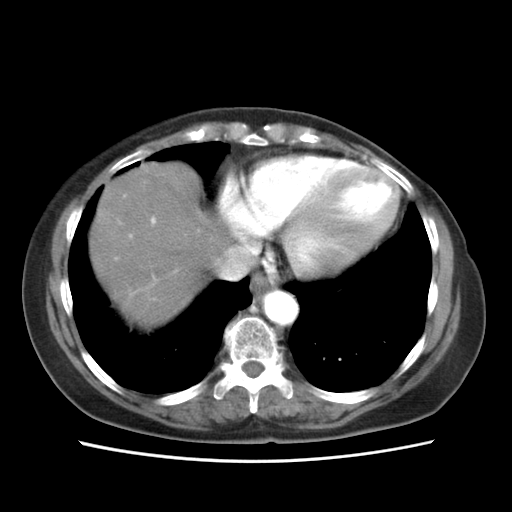

[Series 403: cor a/p · coronal · 0.84mm/px · 6 of 114 slices shown, 11 images]
[im 17/114  soft-tissue]
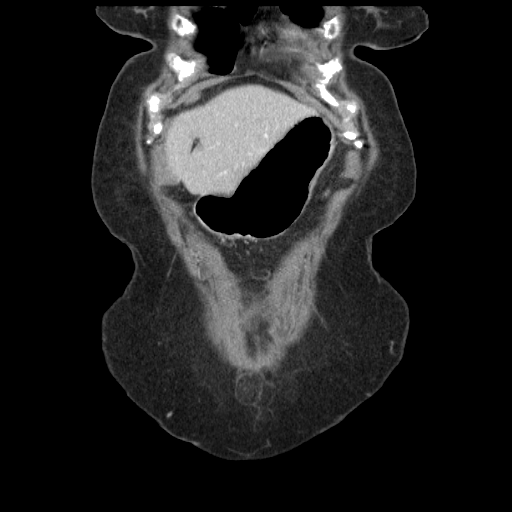
[im 17/114  lung]
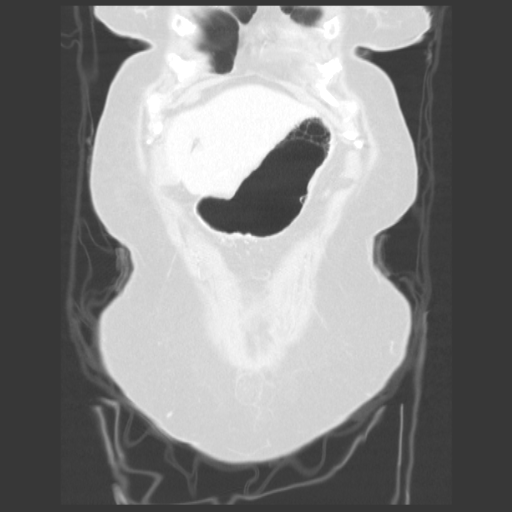
[im 17/114  bone]
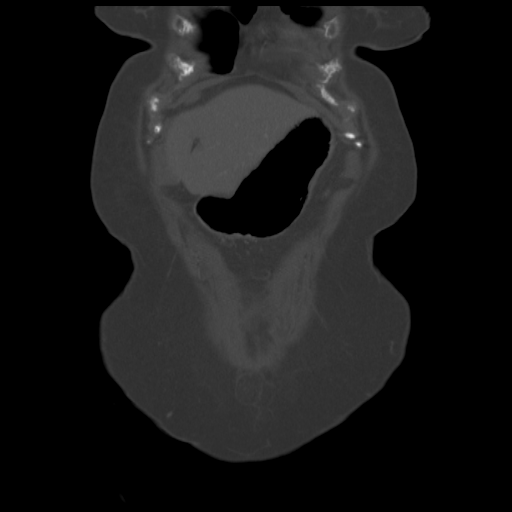
[im 33/114  soft-tissue]
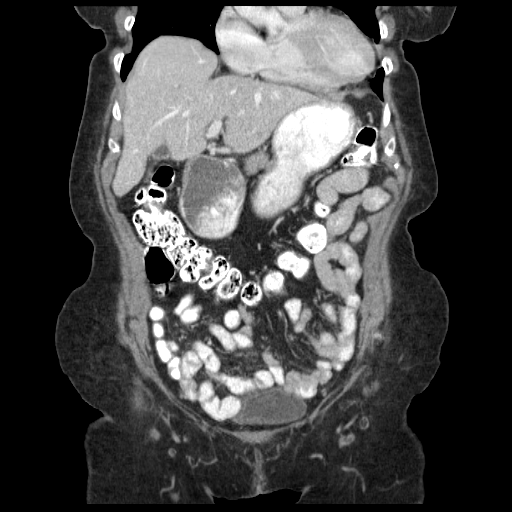
[im 33/114  lung]
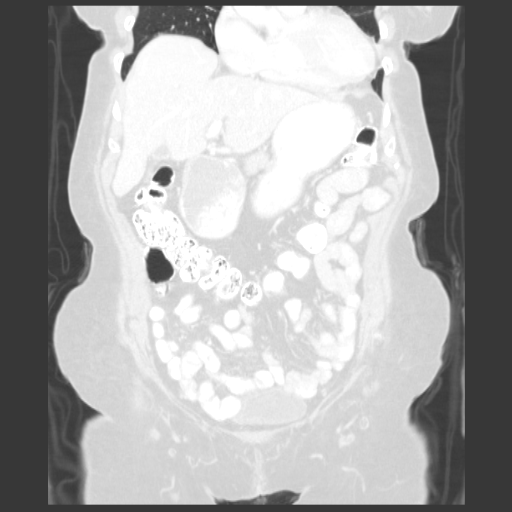
[im 49/114  soft-tissue]
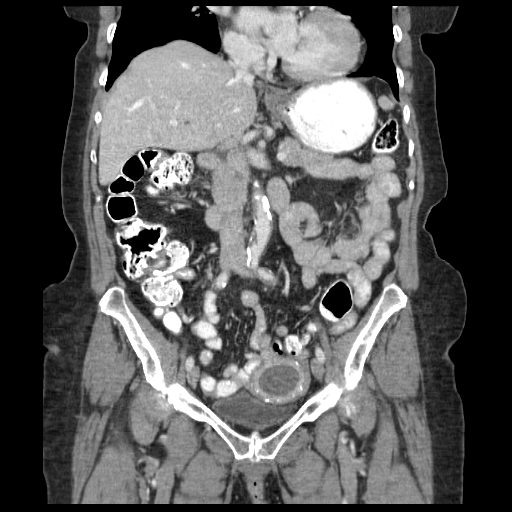
[im 49/114  lung]
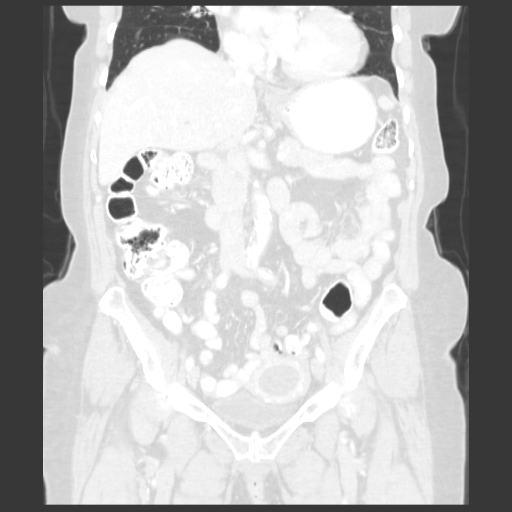
[im 65/114  soft-tissue]
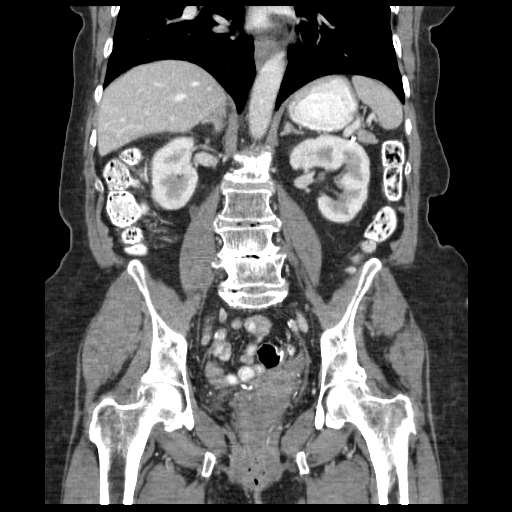
[im 65/114  lung]
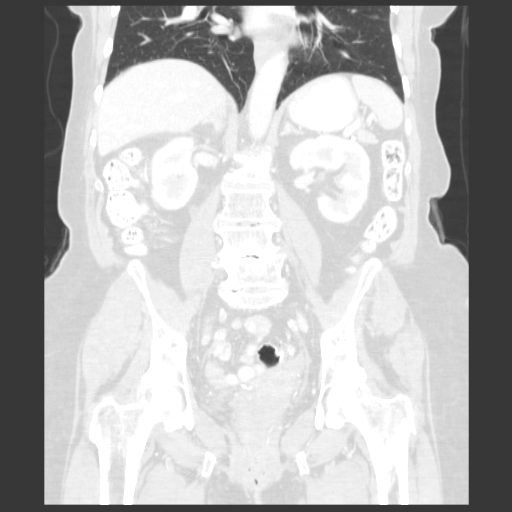
[im 81/114  soft-tissue]
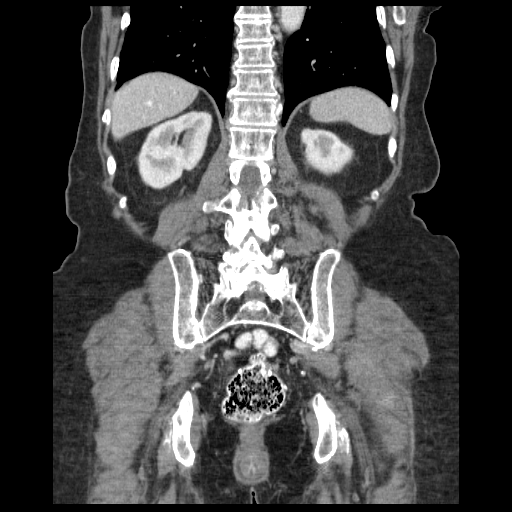
[im 97/114  soft-tissue]
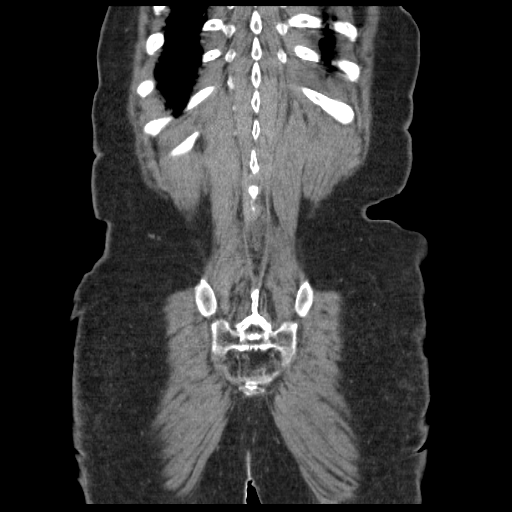

[12 of 36 positions shown; findings below may reference images not displayed]

FINDINGS: Within the inferior aspect of the left lower lung is a 2.7 mm peripheral-based nodule (series 3 image 30) not appreciated on prior exam.  No other focal pulmonary abnormality noted.  Stability of this finding can be confirmed on followup.  Heterogeneous appearance of the thyroid gland with dominant lesions on the right.  One could perform ultrasound for further delineation.  Pretracheal and right hilar slightly enlarged lymph nodes have cleared in the interim.  There is an elongated aortopulmonary window lymph node with short-axis dimension of 8.6 mm.  No bony destructive lesion.  No obvious pulmonary embolus.  Cardiac stent within left anterior descending coronary artery.
IMPRESSION: 1.  2.7 mm nodule peripheral aspect left lung base.  This was not seen previously possibly related to slight differences in sectioning plane between the two exams.  This can be evaluated on followup.  
2.  No mediastinal, hilar or axillary adenopathy. 
3.  Heterogeneous appearance of the thyroid gland.  
4.  Left anterior descending coronary artery stent is in place.  
ABDOMEN CT WITH CONTRAST:
FINDINGS: Small hiatal hernia.  Within the posterior aspect of the right lobe of the liver is a 5 mm low-density structure.  This appears slightly more conspicuous on the present exam but was present previously and I suspect represents a small cyst rather than metastatic lesion.  No focal splenic left renal or right adrenal lesion.   7 mm low-density posterior aspect right kidney may be a cyst but is too small to adequately characterize although unchanged.  Mild hyperplasia left adrenal gland stable.  No pancreatic mass.  Calcifications near the pancreatic tail region are felt to be vascular in origin.  The aorta is calcified and ectatic without aneurysmal dilatation.  Prominent plaque at the aortic bifurcation.  Degenerative changes lumbar spine with mild loss of height L1 vertebra and minimal retropulsion.  No bony destructive lesion.
IMPRESSION: 1.  No evidence of metastatic disease in the upper abdomen.  The 5 mm low density lesion seen within the right lobe of liver I suspect is unchanged and may represent a small cyst.  
2.  Atherosclerotic type changes aorta. 
3.  Degenerative changes lumbar spine. 
PELVIS CT WITH CONTRAST:
FINDINGS: Markedly abnormal appearance of the uterus with central 2.6 cm fluid collection.  This is much larger than expected for a patient of this age.  Endometrial malignancy can therefore not be excluded.  The patient has undergone surgical resection of tumor at the rectosigmoid junction.  No recurrent mass is seen at this level.  No surrounding pelvic adenopathy.  Minimal lobulated appearance of the left ovary.  Bilateral hip joint degenerative changes.
IMPRESSION: 1.  Markedly abnormal appearance of the uterus with prominent central fluid collection.  Endometrial malignancy cannot be excluded and further evaluation will be necessary. 
2.  Post surgical changes rectosigmoid region without findings to suggest recurrence. 
3.  Bilateral hip joint degenerative changes.

## 2007-10-15 ENCOUNTER — Ambulatory Visit: Payer: Self-pay | Admitting: Hematology and Oncology

## 2007-10-23 ENCOUNTER — Encounter (INDEPENDENT_AMBULATORY_CARE_PROVIDER_SITE_OTHER): Payer: Self-pay | Admitting: *Deleted

## 2007-10-24 ENCOUNTER — Encounter (INDEPENDENT_AMBULATORY_CARE_PROVIDER_SITE_OTHER): Payer: Self-pay | Admitting: *Deleted

## 2007-10-24 LAB — CBC WITH DIFFERENTIAL/PLATELET
BASO%: 0.3 % (ref 0.0–2.0)
Basophils Absolute: 0 10*3/uL (ref 0.0–0.1)
EOS%: 2.5 % (ref 0.0–7.0)
HCT: 32.7 % — ABNORMAL LOW (ref 34.8–46.6)
HGB: 10.5 g/dL — ABNORMAL LOW (ref 11.6–15.9)
LYMPH%: 29.3 % (ref 14.0–48.0)
MCH: 23.1 pg — ABNORMAL LOW (ref 26.0–34.0)
MCHC: 32.2 g/dL (ref 32.0–36.0)
MCV: 71.9 fL — ABNORMAL LOW (ref 81.0–101.0)
NEUT%: 59 % (ref 39.6–76.8)
Platelets: 219 10*3/uL (ref 145–400)
lymph#: 1.3 10*3/uL (ref 0.9–3.3)

## 2007-10-24 LAB — COMPREHENSIVE METABOLIC PANEL
AST: 13 U/L (ref 0–37)
BUN: 13 mg/dL (ref 6–23)
Calcium: 9.7 mg/dL (ref 8.4–10.5)
Chloride: 106 mEq/L (ref 96–112)
Creatinine, Ser: 1.05 mg/dL (ref 0.40–1.20)
Total Bilirubin: 0.3 mg/dL (ref 0.3–1.2)

## 2007-10-24 LAB — CEA: CEA: 1.1 ng/mL (ref 0.0–5.0)

## 2007-11-18 ENCOUNTER — Telehealth (INDEPENDENT_AMBULATORY_CARE_PROVIDER_SITE_OTHER): Payer: Self-pay | Admitting: *Deleted

## 2007-12-05 ENCOUNTER — Encounter (INDEPENDENT_AMBULATORY_CARE_PROVIDER_SITE_OTHER): Payer: Self-pay | Admitting: Obstetrics and Gynecology

## 2007-12-05 ENCOUNTER — Ambulatory Visit (HOSPITAL_COMMUNITY): Admission: RE | Admit: 2007-12-05 | Discharge: 2007-12-05 | Payer: Self-pay | Admitting: Obstetrics and Gynecology

## 2007-12-12 ENCOUNTER — Telehealth (INDEPENDENT_AMBULATORY_CARE_PROVIDER_SITE_OTHER): Payer: Self-pay | Admitting: *Deleted

## 2007-12-25 ENCOUNTER — Emergency Department (HOSPITAL_COMMUNITY): Admission: EM | Admit: 2007-12-25 | Discharge: 2007-12-26 | Payer: Self-pay | Admitting: Emergency Medicine

## 2008-02-03 ENCOUNTER — Encounter (INDEPENDENT_AMBULATORY_CARE_PROVIDER_SITE_OTHER): Payer: Self-pay | Admitting: *Deleted

## 2008-02-14 ENCOUNTER — Encounter (INDEPENDENT_AMBULATORY_CARE_PROVIDER_SITE_OTHER): Payer: Self-pay | Admitting: *Deleted

## 2008-02-16 IMAGING — CR DG CHEST 1V PORT
1 series · 1 of 1 positions shown · non-contrast
Comparison: CT, 08/22/07 and radiographs, 07/06/07 and earlier.

CLINICAL DATA: 84 year old female with chest pain.  
 PORTABLE CHEST - 1 VIEW - 12/26/07:

[AP]
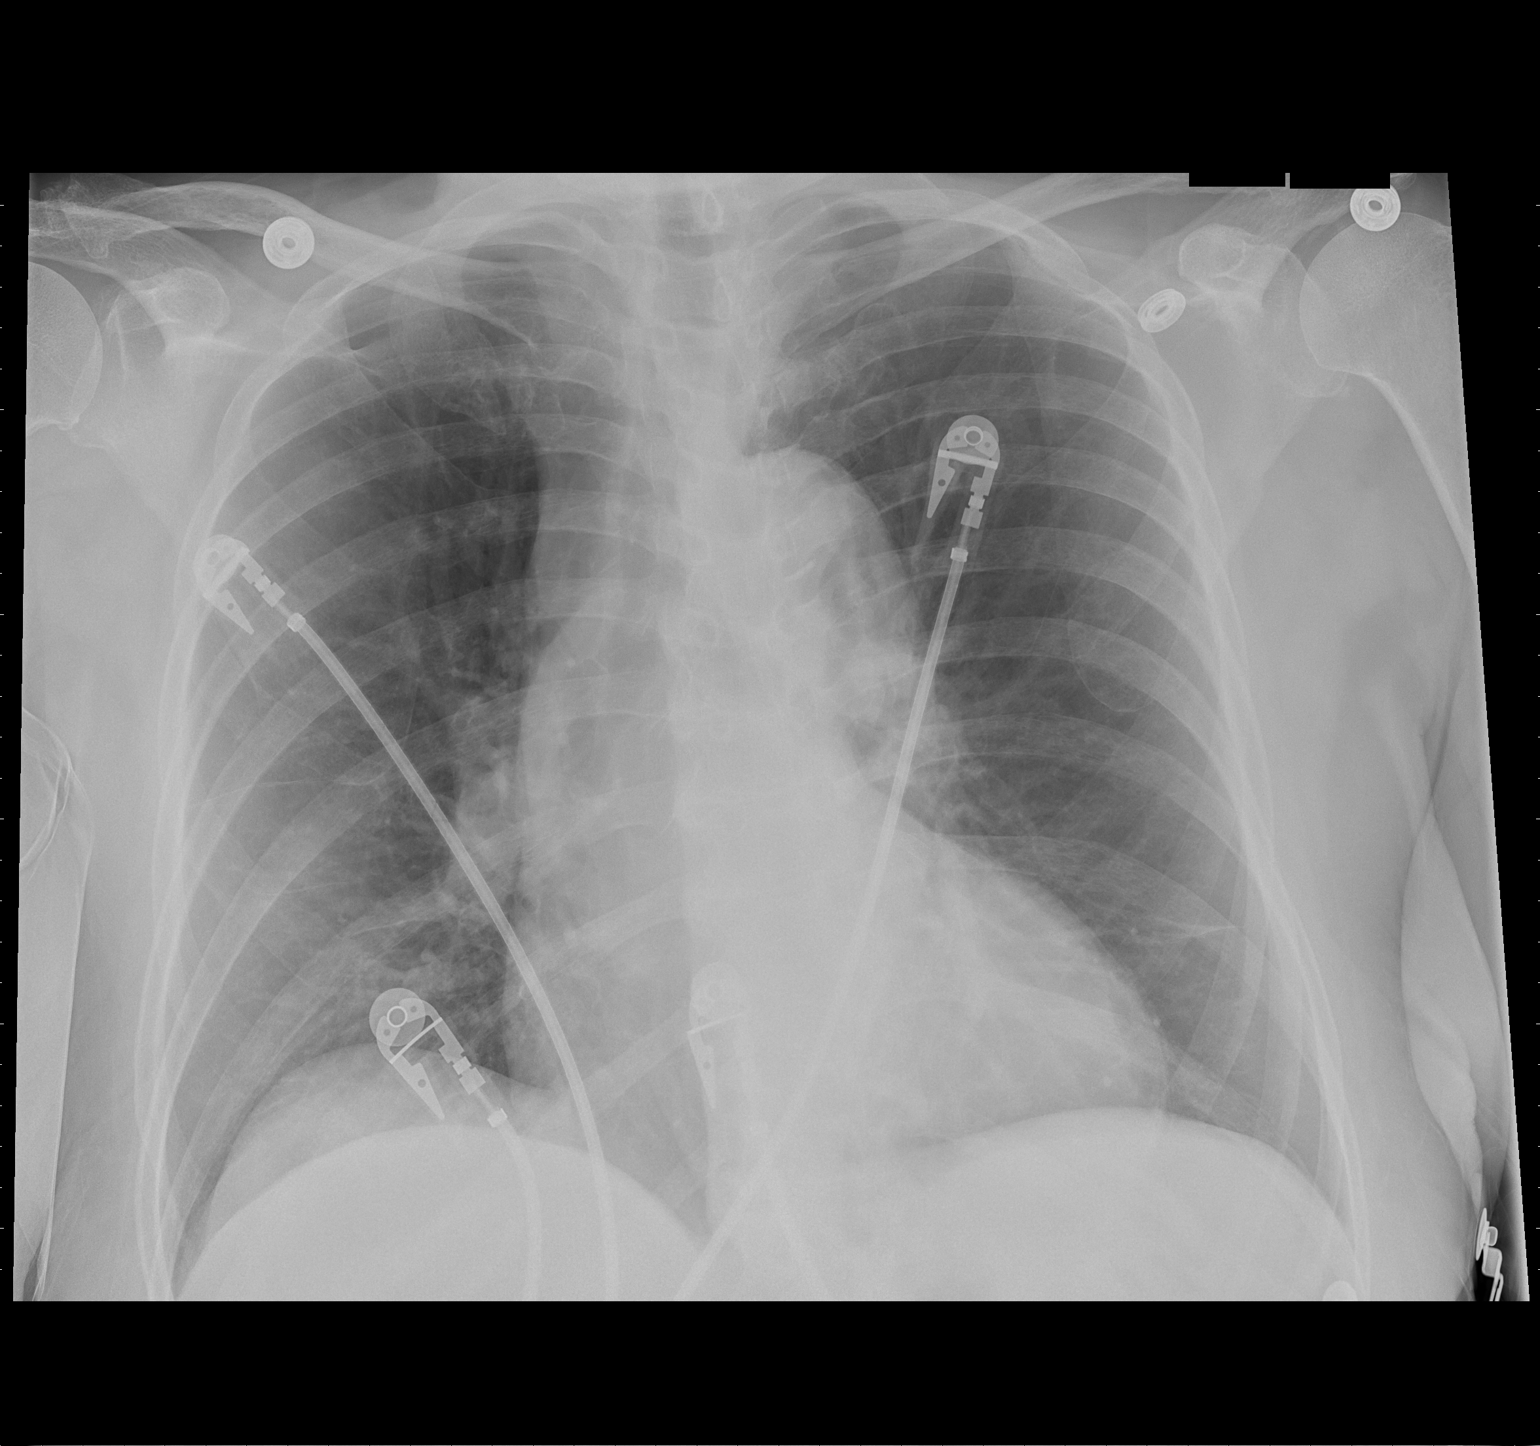

[1 of 1 positions shown; findings below may reference images not displayed]

FINDINGS: Mild cardiomegaly.  Stable tortuosity of the thoracic aorta.  No pneumothorax, pleural effusion, or pulmonary edema.  Stable chronic scarring or atelectasis at the lateral left base.  No consolidation or acute air space opacity.
IMPRESSION: No acute cardiopulmonary abnormality.

## 2008-02-17 ENCOUNTER — Ambulatory Visit: Payer: Self-pay | Admitting: Family Medicine

## 2008-02-17 LAB — CONVERTED CEMR LAB: Hgb A1c MFr Bld: 6.8 %

## 2008-02-18 ENCOUNTER — Encounter (INDEPENDENT_AMBULATORY_CARE_PROVIDER_SITE_OTHER): Payer: Self-pay | Admitting: *Deleted

## 2008-03-02 ENCOUNTER — Telehealth (INDEPENDENT_AMBULATORY_CARE_PROVIDER_SITE_OTHER): Payer: Self-pay | Admitting: *Deleted

## 2008-03-12 ENCOUNTER — Encounter (INDEPENDENT_AMBULATORY_CARE_PROVIDER_SITE_OTHER): Payer: Self-pay | Admitting: *Deleted

## 2008-04-08 ENCOUNTER — Ambulatory Visit: Payer: Self-pay | Admitting: Family Medicine

## 2008-04-08 ENCOUNTER — Encounter (INDEPENDENT_AMBULATORY_CARE_PROVIDER_SITE_OTHER): Payer: Self-pay | Admitting: *Deleted

## 2008-04-09 ENCOUNTER — Encounter (INDEPENDENT_AMBULATORY_CARE_PROVIDER_SITE_OTHER): Payer: Self-pay | Admitting: *Deleted

## 2008-04-09 LAB — CONVERTED CEMR LAB
Basophils Absolute: 0 10*3/uL (ref 0.0–0.1)
Basophils Relative: 1 % (ref 0–1)
CEA: 1.2 ng/mL (ref 0.0–5.0)
Eosinophils Absolute: 0.1 10*3/uL (ref 0.0–0.7)
Eosinophils Relative: 3 % (ref 0–5)
HCT: 33.6 % — ABNORMAL LOW (ref 36.0–46.0)
MCHC: 30.7 g/dL (ref 30.0–36.0)
MCV: 72.6 fL — ABNORMAL LOW (ref 78.0–100.0)
Platelets: 231 10*3/uL (ref 150–400)
RDW: 16.2 % — ABNORMAL HIGH (ref 11.5–15.5)

## 2008-04-15 ENCOUNTER — Encounter: Payer: Self-pay | Admitting: *Deleted

## 2008-04-17 ENCOUNTER — Ambulatory Visit: Payer: Self-pay | Admitting: Hematology and Oncology

## 2008-05-06 ENCOUNTER — Emergency Department (HOSPITAL_COMMUNITY): Admission: EM | Admit: 2008-05-06 | Discharge: 2008-05-06 | Payer: Self-pay | Admitting: Emergency Medicine

## 2008-05-07 ENCOUNTER — Ambulatory Visit: Payer: Self-pay | Admitting: Family Medicine

## 2008-05-11 ENCOUNTER — Encounter: Payer: Self-pay | Admitting: Family Medicine

## 2008-05-13 ENCOUNTER — Ambulatory Visit: Payer: Self-pay | Admitting: Family Medicine

## 2008-05-13 ENCOUNTER — Encounter (INDEPENDENT_AMBULATORY_CARE_PROVIDER_SITE_OTHER): Payer: Self-pay | Admitting: Family Medicine

## 2008-05-13 LAB — CONVERTED CEMR LAB
CO2: 24 meq/L (ref 19–32)
Calcium: 9.9 mg/dL (ref 8.4–10.5)
Chloride: 105 meq/L (ref 96–112)
Creatinine, Ser: 0.8 mg/dL (ref 0.40–1.20)
Glucose, Bld: 157 mg/dL — ABNORMAL HIGH (ref 70–99)

## 2008-05-18 ENCOUNTER — Encounter (INDEPENDENT_AMBULATORY_CARE_PROVIDER_SITE_OTHER): Payer: Self-pay | Admitting: Family Medicine

## 2008-05-21 ENCOUNTER — Ambulatory Visit: Payer: Self-pay | Admitting: Family Medicine

## 2008-06-27 IMAGING — CR DG CHEST 2V
2 series · 2 of 2 positions shown · non-contrast
Comparison: 12/26/2007.

CLINICAL DATA: Worsening cough.  Left mid back pain.

CHEST - 2 VIEW

[view not recorded (1 of 2)]
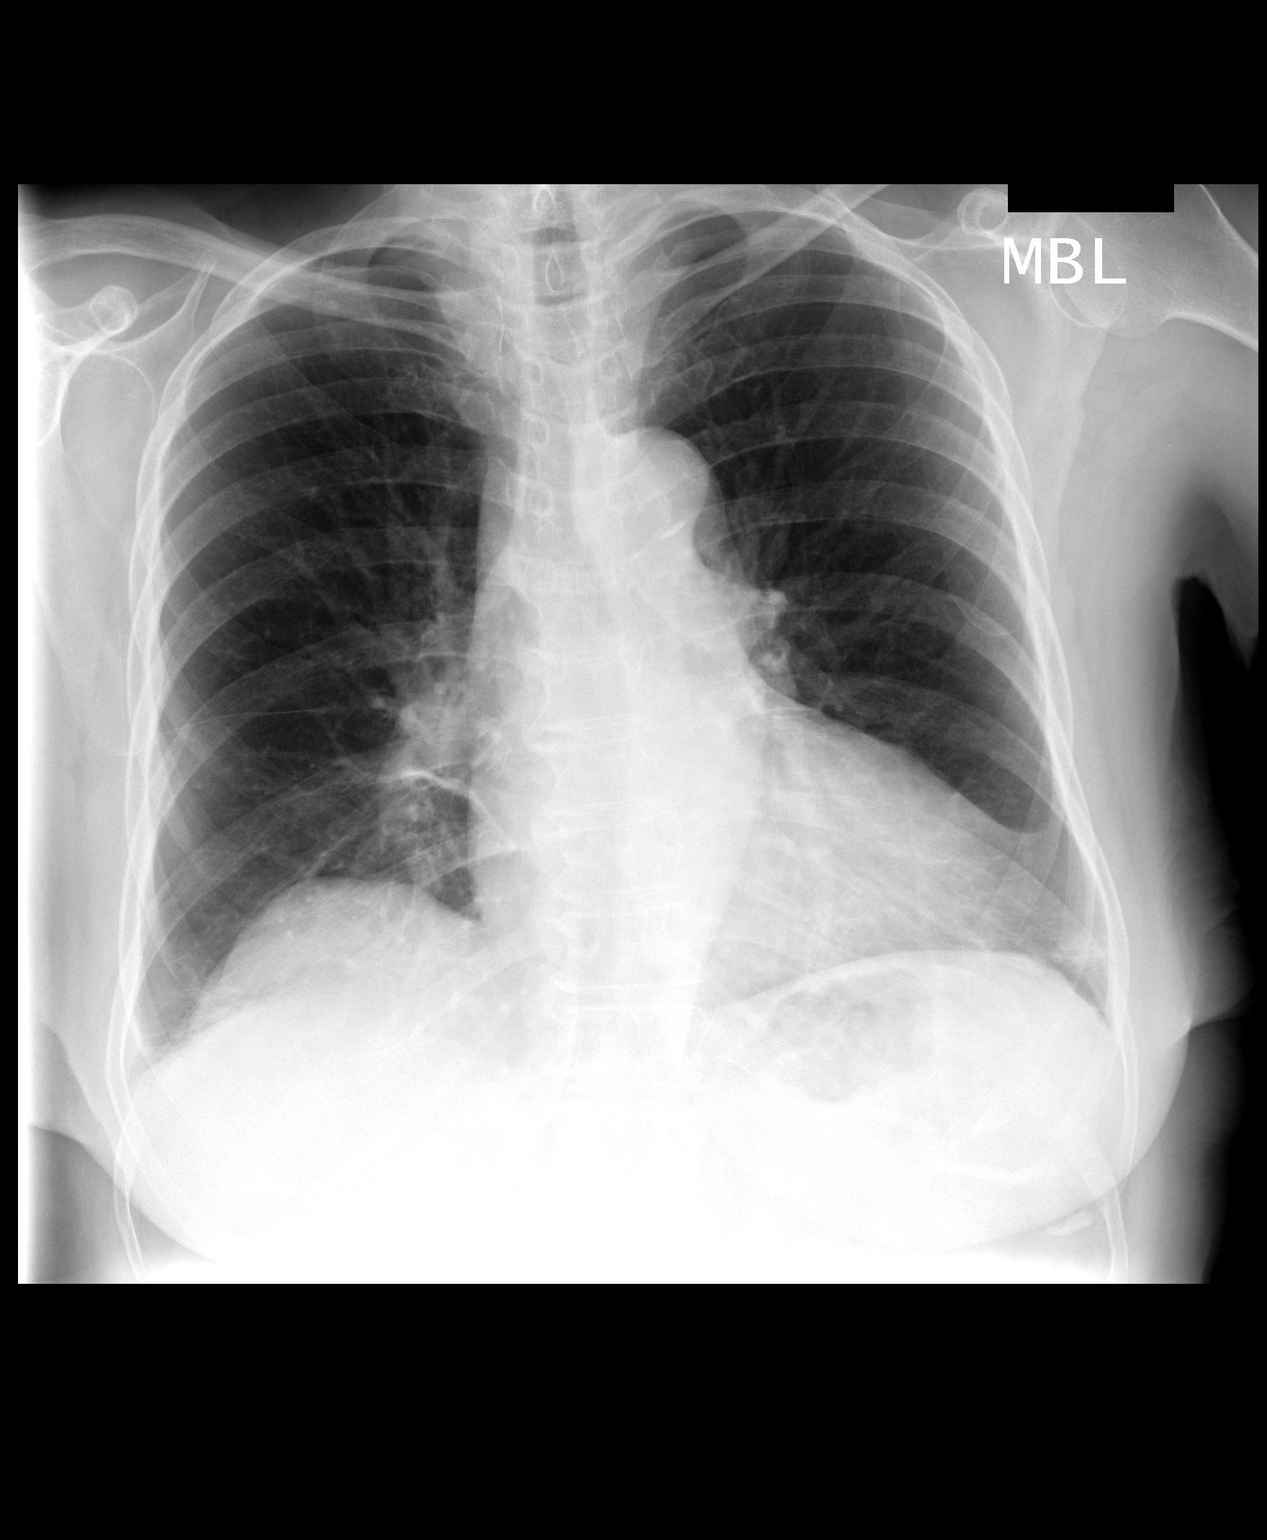

[view not recorded (2 of 2)]
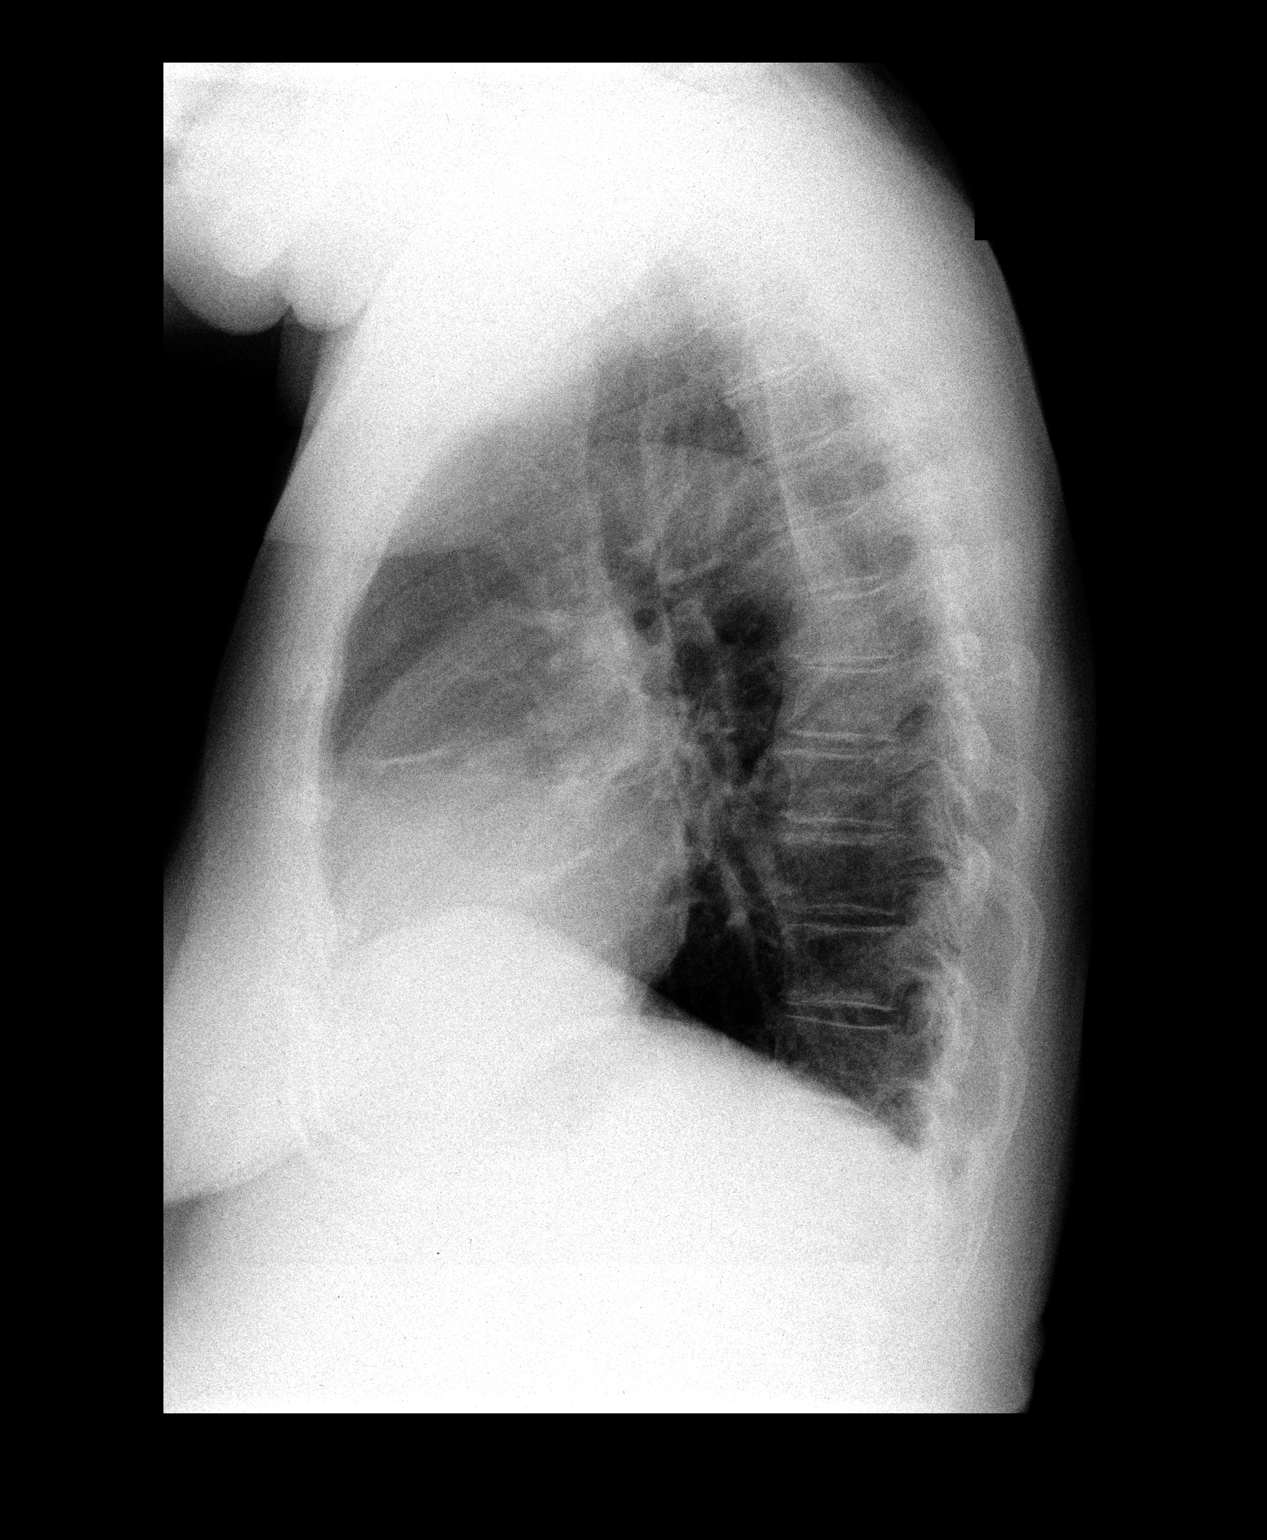

[2 of 2 positions shown; findings below may reference images not displayed]

FINDINGS: Trachea midline.  Heart is at the upper limits of normal
in size to mildly enlarged, stable.  There is new air space disease
at the costophrenic angles bilaterally.  Right middle lobe
scarring.
IMPRESSION: Minimal air space disease in the costophrenic angles bilaterally,
nonspecific.  Early pneumonia not excluded.

## 2008-06-30 ENCOUNTER — Ambulatory Visit: Payer: Self-pay | Admitting: Family Medicine

## 2008-06-30 LAB — CONVERTED CEMR LAB

## 2008-07-01 ENCOUNTER — Telehealth (INDEPENDENT_AMBULATORY_CARE_PROVIDER_SITE_OTHER): Payer: Self-pay | Admitting: *Deleted

## 2008-07-08 ENCOUNTER — Emergency Department (HOSPITAL_COMMUNITY): Admission: EM | Admit: 2008-07-08 | Discharge: 2008-07-08 | Payer: Self-pay | Admitting: Emergency Medicine

## 2008-07-14 ENCOUNTER — Telehealth (INDEPENDENT_AMBULATORY_CARE_PROVIDER_SITE_OTHER): Payer: Self-pay | Admitting: *Deleted

## 2008-07-17 ENCOUNTER — Telehealth: Payer: Self-pay | Admitting: Family Medicine

## 2008-07-23 ENCOUNTER — Telehealth: Payer: Self-pay | Admitting: Family Medicine

## 2008-07-27 ENCOUNTER — Encounter: Payer: Self-pay | Admitting: Family Medicine

## 2008-08-03 ENCOUNTER — Telehealth: Payer: Self-pay | Admitting: *Deleted

## 2008-08-04 ENCOUNTER — Encounter: Payer: Self-pay | Admitting: Family Medicine

## 2008-08-04 ENCOUNTER — Ambulatory Visit: Payer: Self-pay | Admitting: Family Medicine

## 2008-08-04 ENCOUNTER — Encounter: Admission: RE | Admit: 2008-08-04 | Discharge: 2008-08-04 | Payer: Self-pay | Admitting: Family Medicine

## 2008-08-04 LAB — CONVERTED CEMR LAB

## 2008-08-05 LAB — CONVERTED CEMR LAB
BUN: 16 mg/dL (ref 6–23)
Basophils Absolute: 0 10*3/uL (ref 0.0–0.1)
Basophils Relative: 1 % (ref 0–1)
Calcium: 9.9 mg/dL (ref 8.4–10.5)
Eosinophils Absolute: 0.1 10*3/uL (ref 0.0–0.7)
MCHC: 31.4 g/dL (ref 30.0–36.0)
MCV: 71.2 fL — ABNORMAL LOW (ref 78.0–100.0)
Monocytes Absolute: 0.4 10*3/uL (ref 0.1–1.0)
Neutro Abs: 1.4 10*3/uL — ABNORMAL LOW (ref 1.7–7.7)
Neutrophils Relative %: 41 % — ABNORMAL LOW (ref 43–77)
Potassium: 3.8 meq/L (ref 3.5–5.3)
RDW: 15.8 % — ABNORMAL HIGH (ref 11.5–15.5)
Sodium: 140 meq/L (ref 135–145)

## 2008-08-18 ENCOUNTER — Ambulatory Visit: Payer: Self-pay | Admitting: Family Medicine

## 2008-08-29 IMAGING — CR DG CHEST 2V
2 series · 2 of 2 positions shown · non-contrast
Comparison: 05/06/2008.

CLINICAL DATA: Hypertension.  Diabetes.  Colon cancer diagnosed
last year.

CHEST - 2 VIEW

[w chest lat]
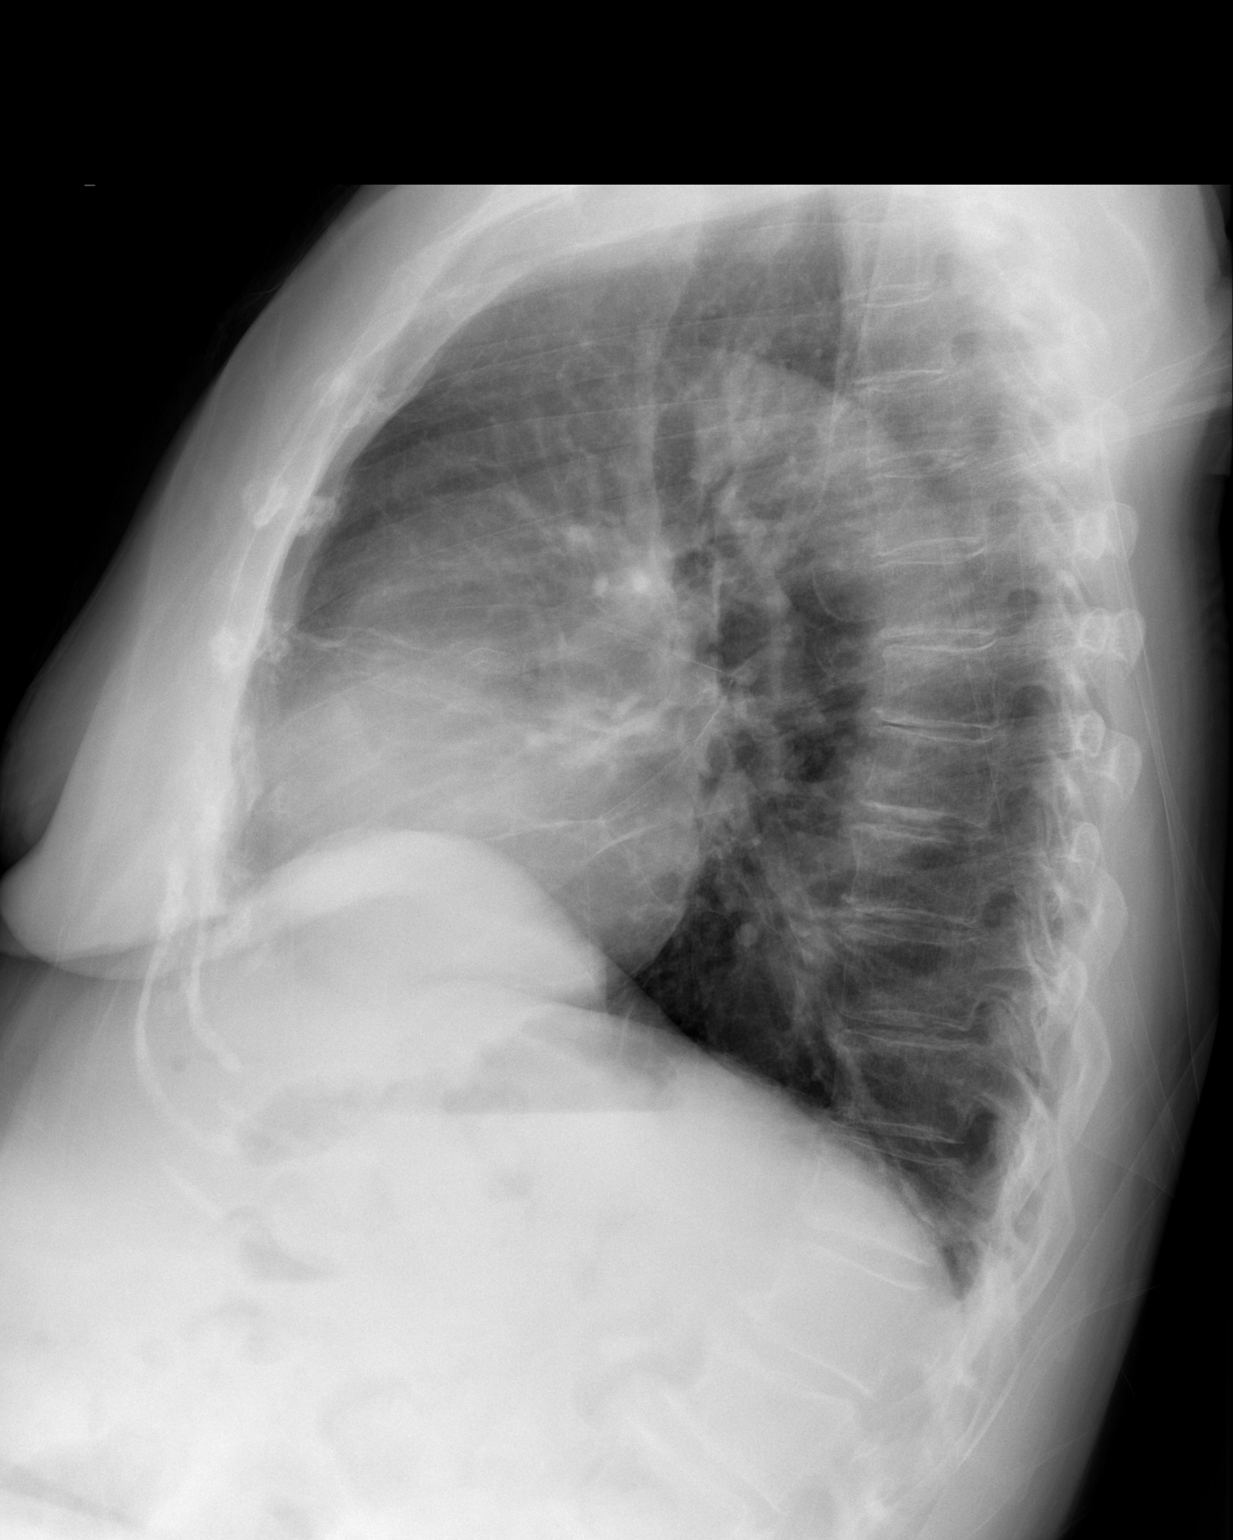

[w chest pa]
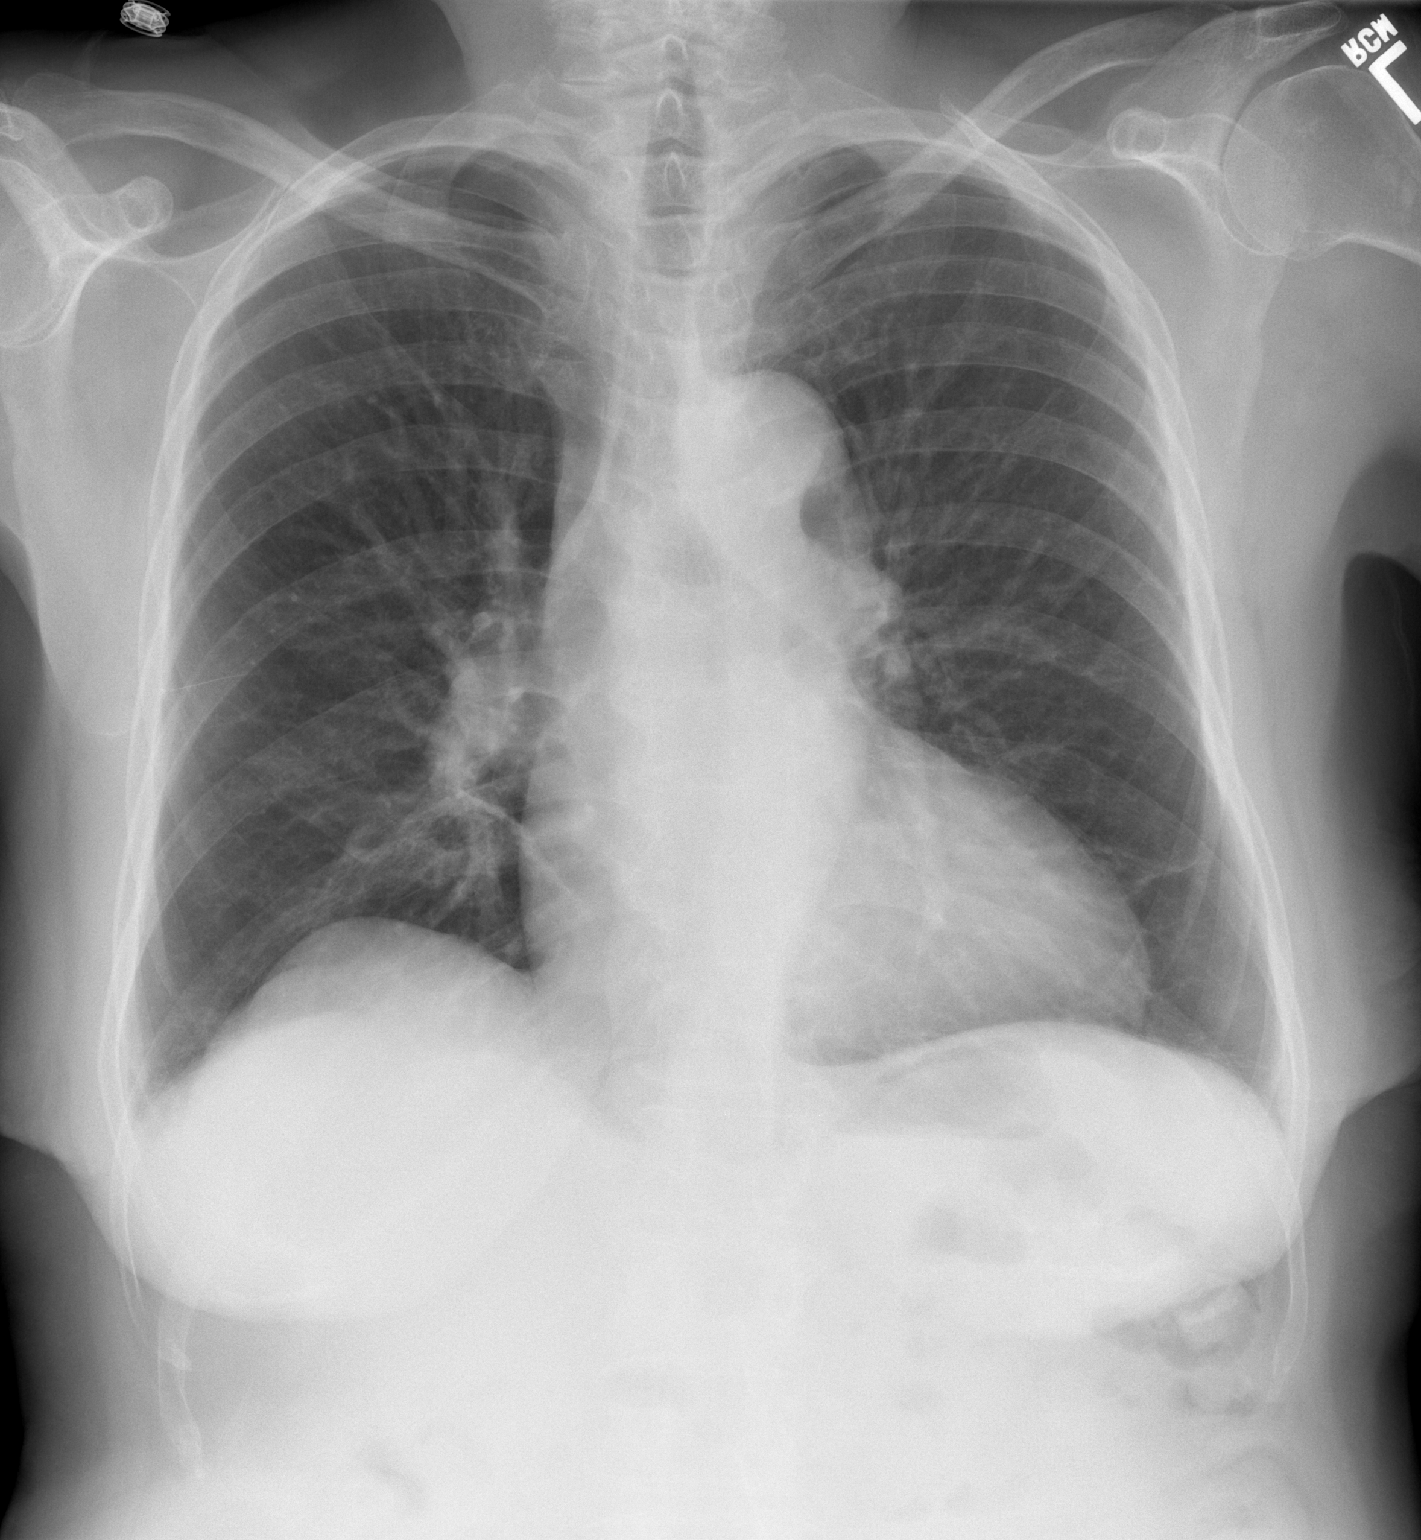

[2 of 2 positions shown; findings below may reference images not displayed]

FINDINGS: The cardiac silhouette remains borderline enlarged with a
prominent left ventricular contour.  The aorta remains tortuous and
partially calcified.  A small amount of linear scar formation at
both lung bases is again demonstrated.  The pulmonary vasculature
is currently prominent.  Mild thoracic spine and upper lumbar spine
degenerative changes.
IMPRESSION: 1.  Interval pulmonary vascular congestion with stable borderline
cardiomegaly.
2.  Bibasilar scarring.

## 2008-08-29 IMAGING — CR DG LUMBAR SPINE COMPLETE 4+V
5 series · 5 of 5 positions shown · non-contrast
Comparison: 12/14/2004.

CLINICAL DATA: Low back pain.  No known injury.

LUMBAR SPINE - COMPLETE 4+ VIEW

[t l-spine a.p.]
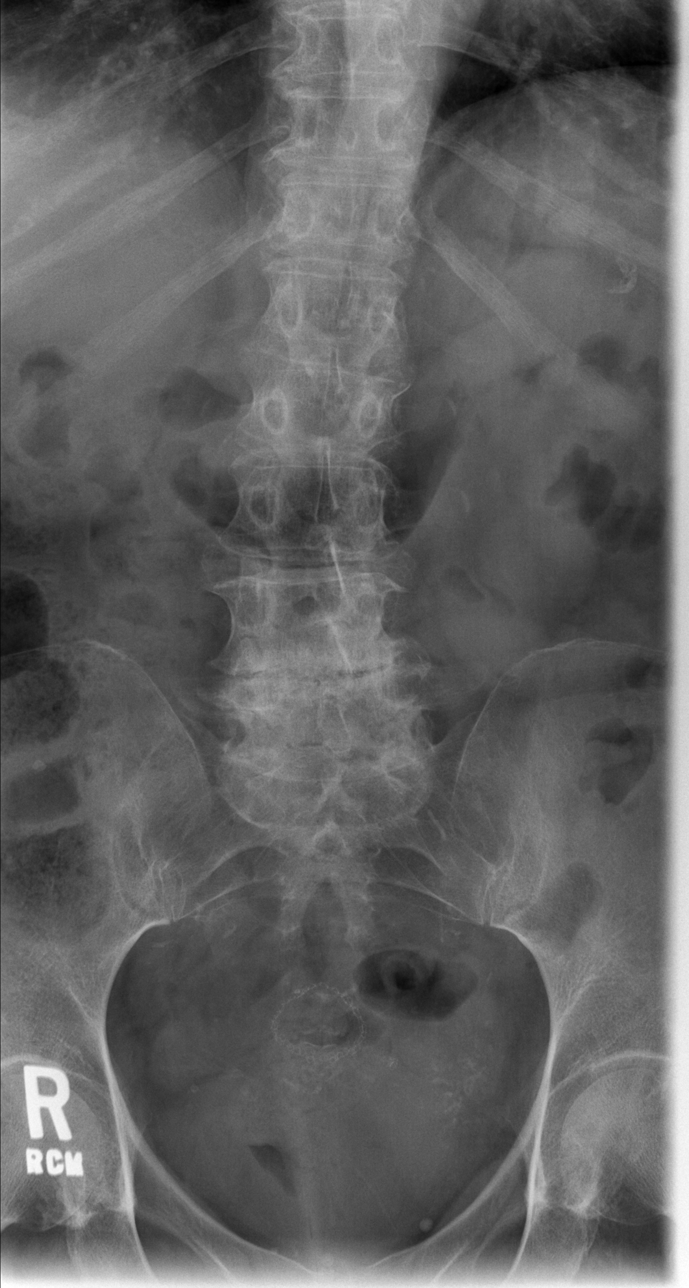

[t l-spine oblique exposure (1 of 2)]
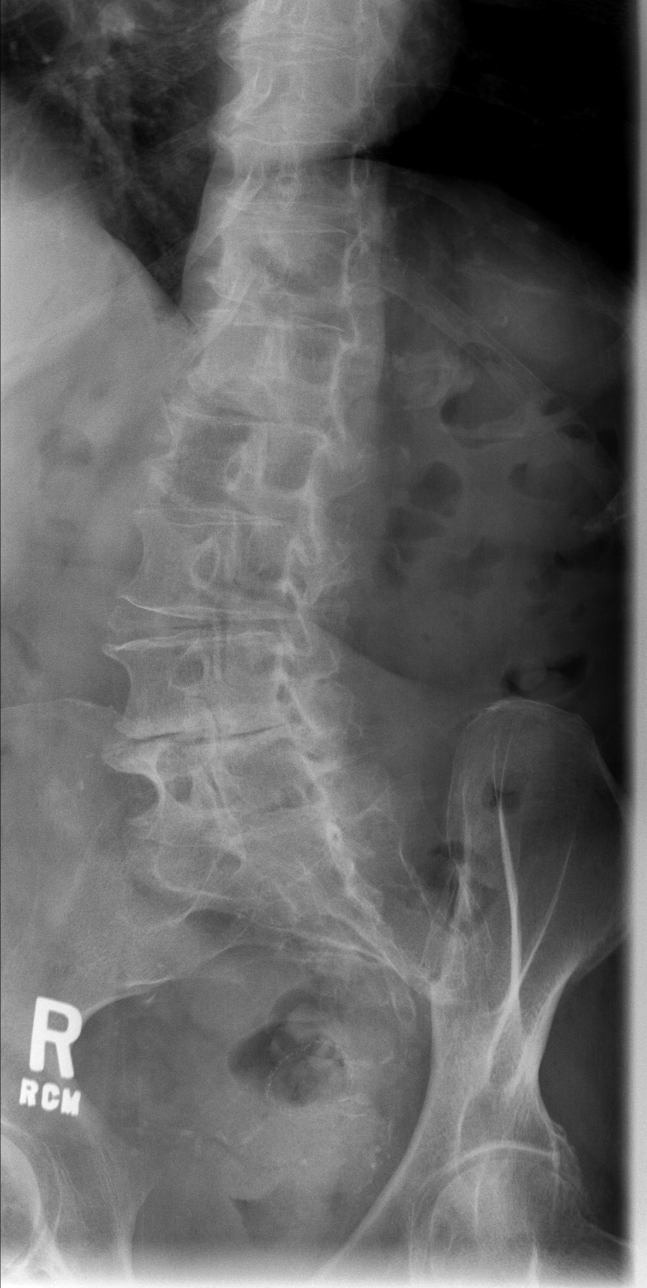

[t l-spine oblique exposure (2 of 2)]
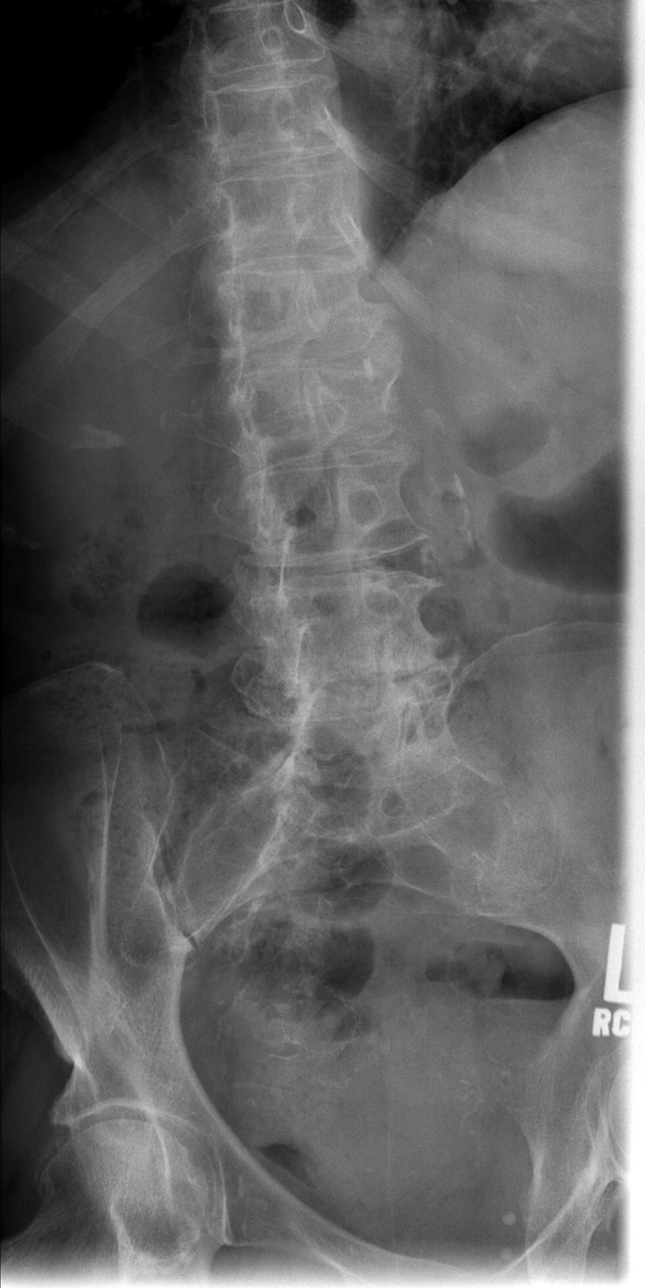

[t l-spine lat]
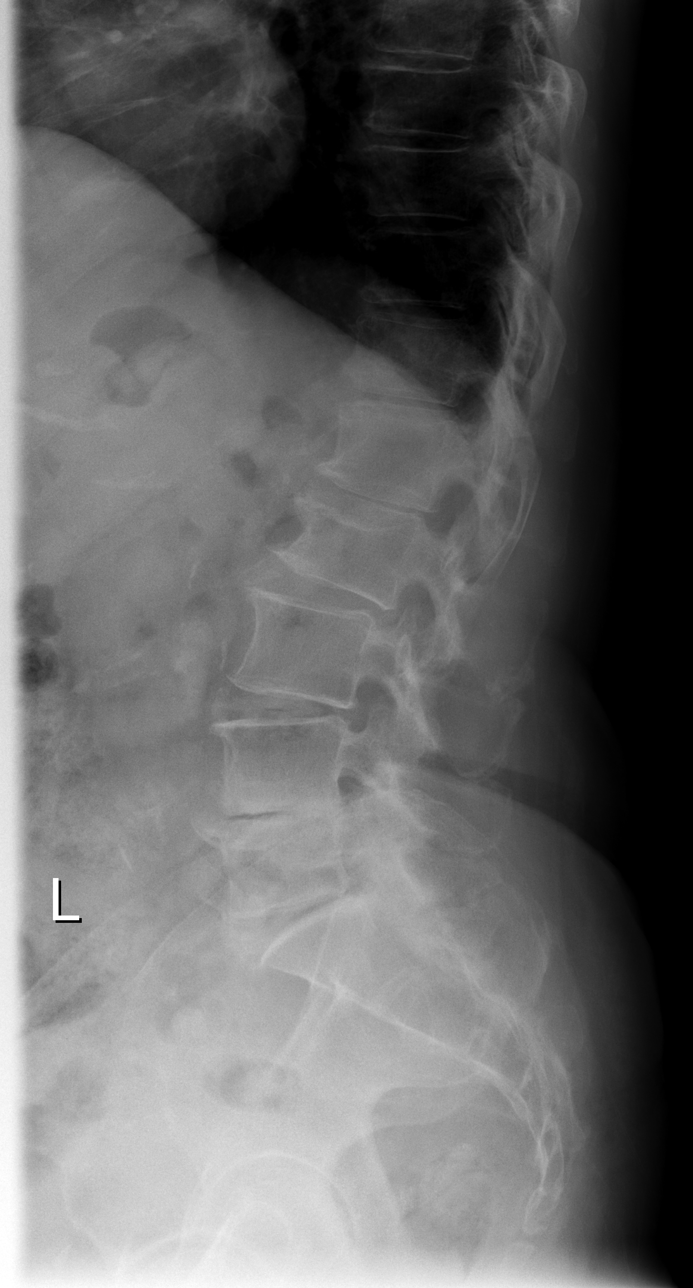

[t l-spine l5-s1 spot]
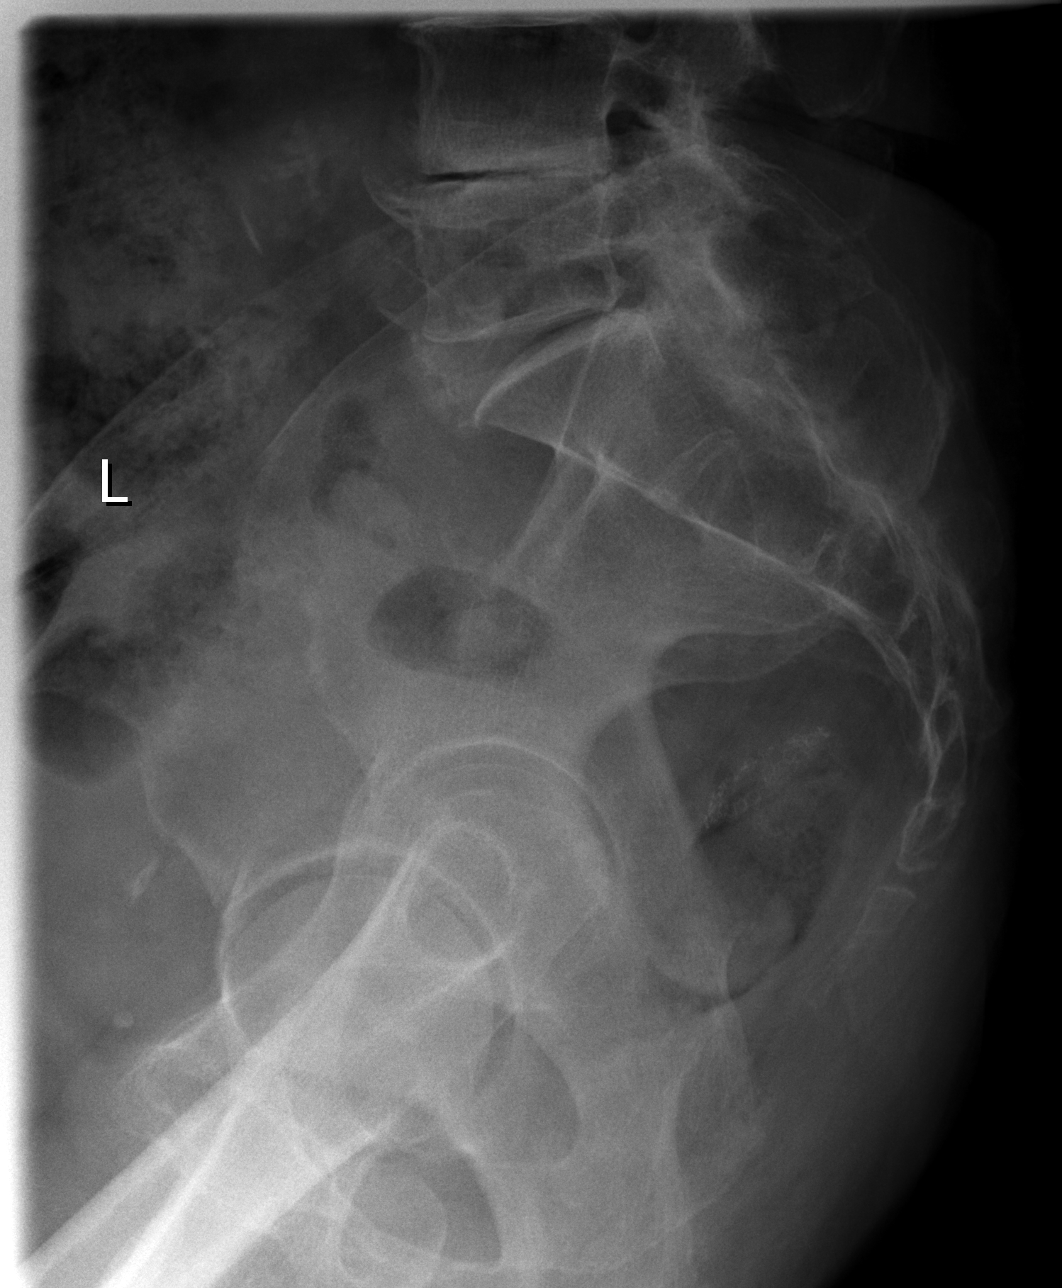

[5 of 5 positions shown; findings below may reference images not displayed]

FINDINGS: Five non-rib bearing lumbar vertebrae.  Progressive
marked disc space narrowing at the L4-5 level with interval
discogenic sclerosis.  A vacuum phenomenon is noted within the disc
at that level and minimally at the L3-4 level.  Moderate anterior
and lateral spur formation is again demonstrated at multiple
levels.  Stable 20% L2 vertebral compression deformity.  Minimal
retrolisthesis at the L4-5 level, improved.  Interval minimal
anterolisthesis at the L5-S1 level.  Moderate disc space narrowing
at that level.  Lower lumbar spine facet degenerative changes.
IMPRESSION: 1.  Progressive degenerative changes, as described above.
2.  Stable old L2 vertebral compression deformity.

## 2008-09-08 ENCOUNTER — Telehealth: Payer: Self-pay | Admitting: Family Medicine

## 2008-09-10 ENCOUNTER — Encounter: Payer: Self-pay | Admitting: Family Medicine

## 2008-09-14 ENCOUNTER — Ambulatory Visit: Payer: Self-pay | Admitting: Family Medicine

## 2008-09-15 ENCOUNTER — Telehealth (INDEPENDENT_AMBULATORY_CARE_PROVIDER_SITE_OTHER): Payer: Self-pay | Admitting: *Deleted

## 2008-09-16 ENCOUNTER — Telehealth (INDEPENDENT_AMBULATORY_CARE_PROVIDER_SITE_OTHER): Payer: Self-pay | Admitting: *Deleted

## 2008-09-17 ENCOUNTER — Telehealth: Payer: Self-pay | Admitting: Family Medicine

## 2008-09-25 IMAGING — CR DG THORACIC SPINE 3V
3 series · 3 of 3 positions shown · non-contrast
Comparison: 07/08/2008 chest x-ray

CLINICAL DATA: Mid back pain.

THORACIC SPINE - 2 VIEW + SWIMMERS

[t t-spine a.p.]
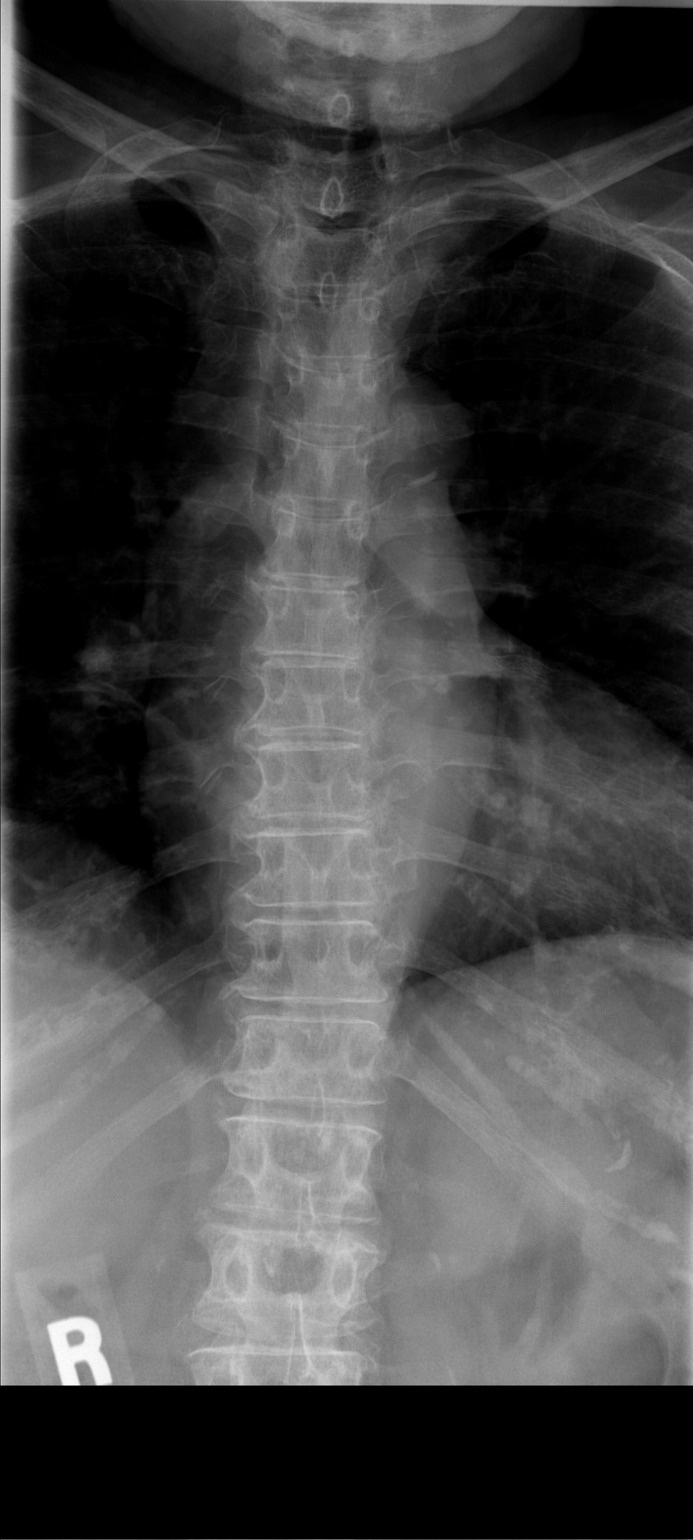

[t t-spine lat *]
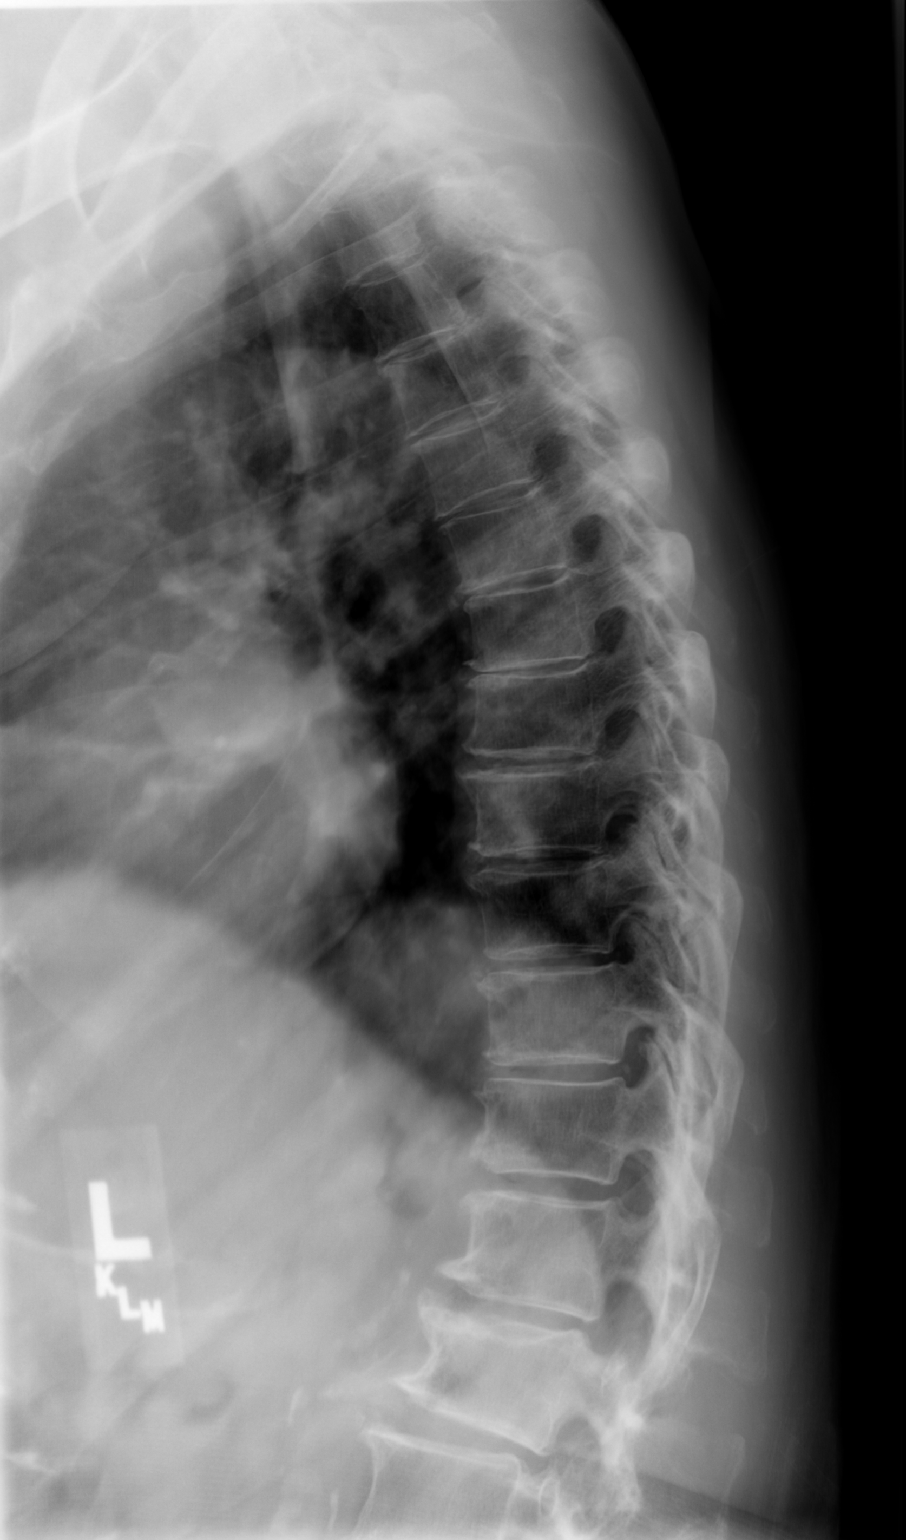

[t swimmers]
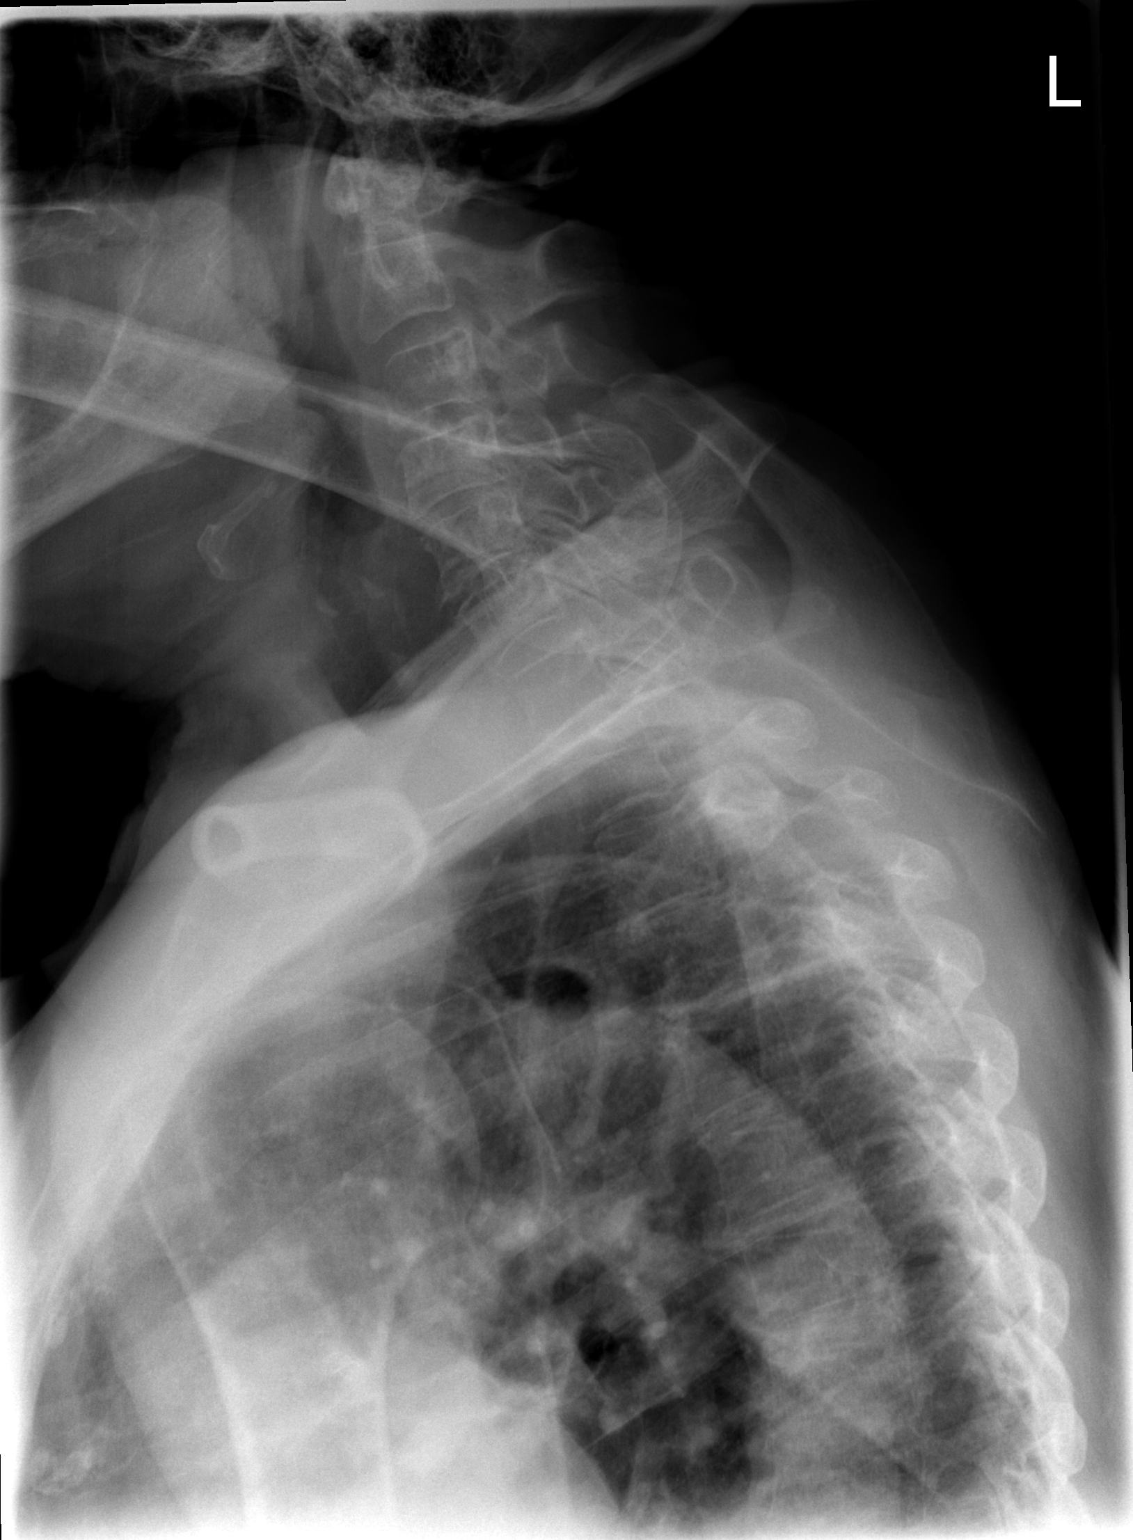

[3 of 3 positions shown; findings below may reference images not displayed]

FINDINGS: There is no evidence of acute fracture, subluxation, or
dislocation.
Mild degenerative disc disease and spondylosis throughout the
thoracic spine is unchanged.
No focal bony lesions are present.
Mild osteopenia is noted.
A remote L2 compression fracture is identified.
IMPRESSION: No evidence of acute abnormality.

Mild multilevel degenerative disc disease and spondylosis.

Remote L2 compression fracture.

## 2008-09-25 IMAGING — CR DG LUMBAR SPINE COMPLETE 4+V
5 series · 5 of 5 positions shown · non-contrast
Comparison: 07/08/2008 and 12/14/2004.

CLINICAL DATA: Back pain.

LUMBAR SPINE - COMPLETE 4+ VIEW

[t l-spine a.p.]
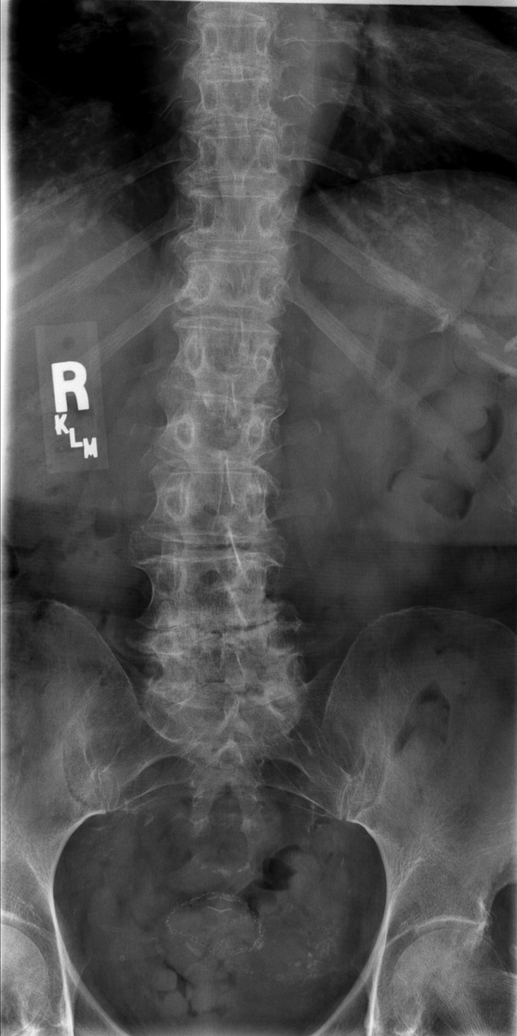

[t l-spine oblique exposure (1 of 2)]
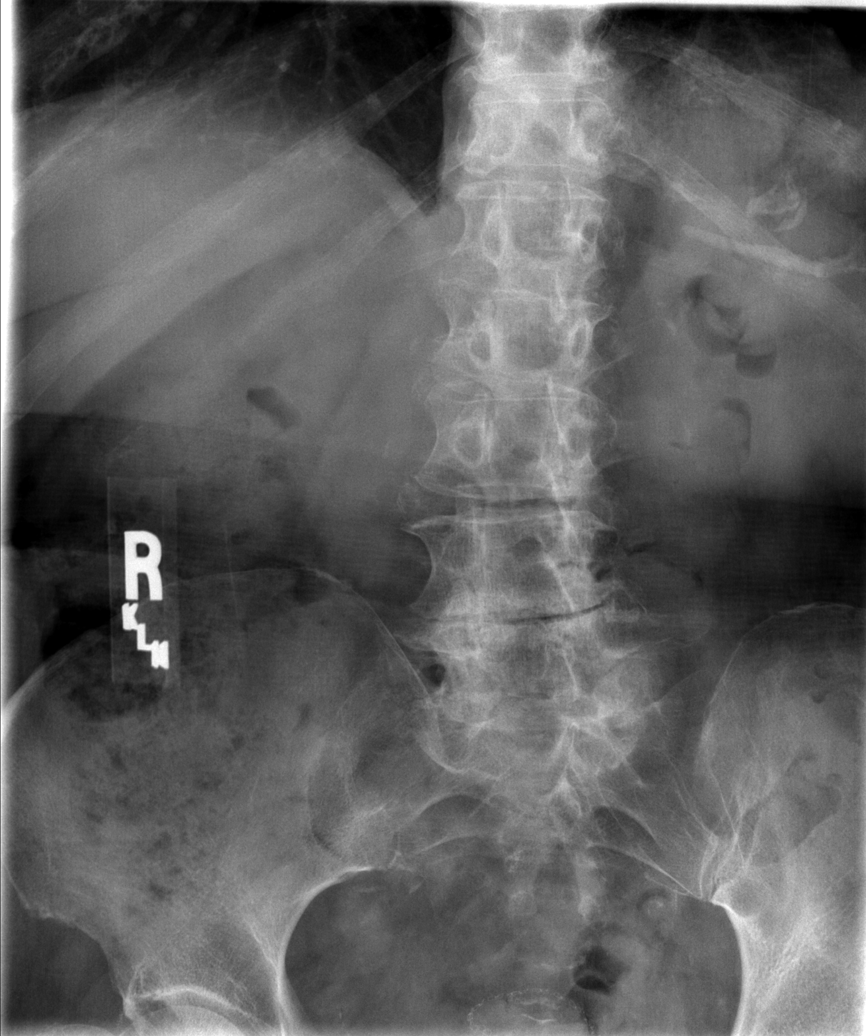

[t l-spine oblique exposure (2 of 2)]
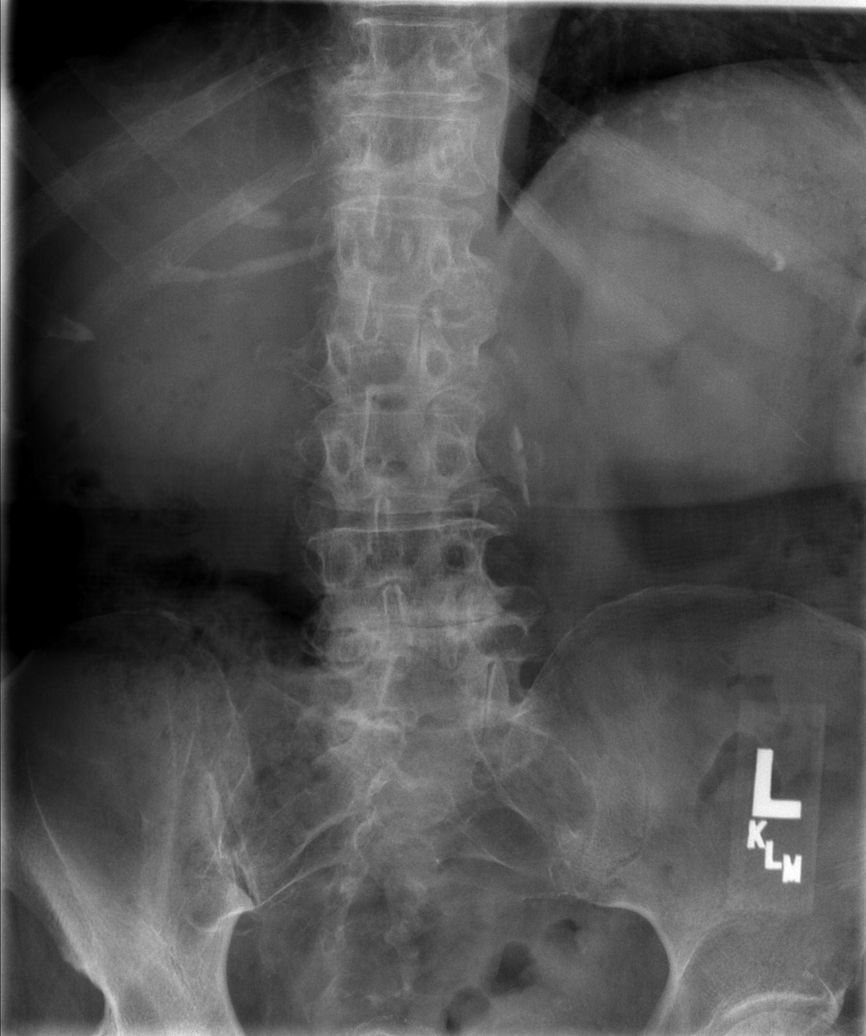

[t l-spine lat]
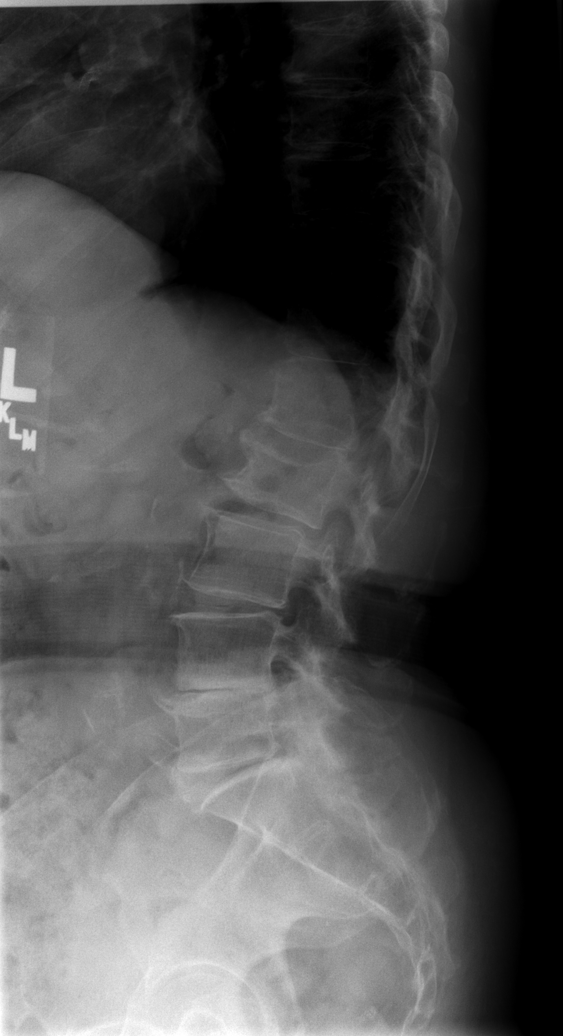

[t l-spine l5-s1 spot]
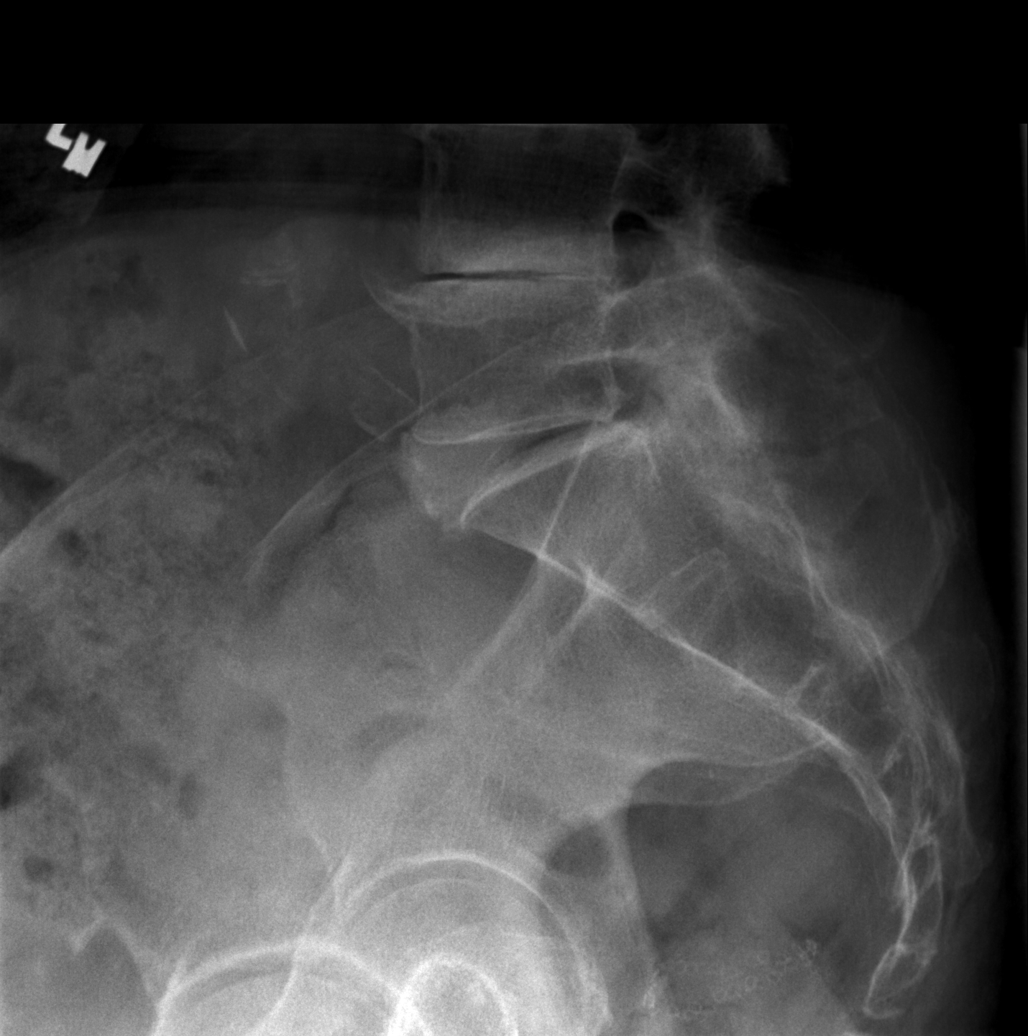

[5 of 5 positions shown; findings below may reference images not displayed]

FINDINGS: Five non-rib bearing lumbar type vertebra are again
identified.
Mild grade 1 retrolisthesis of L2 on L3 is again noted.
A 50% compression fracture of L2 is unchanged.
Multilevel degenerative disc disease, spondylosis, and facet
arthropathy throughout the lumbar spine is noted, severe at L4-L5
and L5-S1.
There is no evidence of acute abnormality noted.
Diffuse osteopenia is present.
IMPRESSION: No evidence of acute abnormality.

Stable multilevel degenerative changes throughout the lumbar spine,
severe at L4-L5 and L5-S1.

Stable L2 compression fracture, grade 1 retrolisthesis of L2 on L3
and diffuse osteopenia.

## 2008-09-29 ENCOUNTER — Telehealth: Payer: Self-pay | Admitting: Family Medicine

## 2008-10-28 ENCOUNTER — Encounter: Payer: Self-pay | Admitting: Family Medicine

## 2008-11-14 ENCOUNTER — Emergency Department (HOSPITAL_COMMUNITY): Admission: EM | Admit: 2008-11-14 | Discharge: 2008-11-14 | Payer: Self-pay | Admitting: Emergency Medicine

## 2008-11-26 ENCOUNTER — Telehealth: Payer: Self-pay | Admitting: Family Medicine

## 2008-11-26 ENCOUNTER — Ambulatory Visit (HOSPITAL_COMMUNITY): Admission: RE | Admit: 2008-11-26 | Discharge: 2008-11-26 | Payer: Self-pay | Admitting: Family Medicine

## 2008-11-26 ENCOUNTER — Ambulatory Visit: Payer: Self-pay | Admitting: Family Medicine

## 2008-11-26 ENCOUNTER — Emergency Department (HOSPITAL_COMMUNITY): Admission: EM | Admit: 2008-11-26 | Discharge: 2008-11-27 | Payer: Self-pay | Admitting: Emergency Medicine

## 2008-11-26 ENCOUNTER — Encounter: Payer: Self-pay | Admitting: Family Medicine

## 2008-11-26 ENCOUNTER — Telehealth: Payer: Self-pay | Admitting: *Deleted

## 2008-11-27 ENCOUNTER — Telehealth: Payer: Self-pay | Admitting: *Deleted

## 2008-11-29 LAB — CONVERTED CEMR LAB
Calcium: 9.9 mg/dL (ref 8.4–10.5)
Glucose, Bld: 160 mg/dL — ABNORMAL HIGH (ref 70–99)
MCHC: 31.3 g/dL (ref 30.0–36.0)
MCV: 71.3 fL — ABNORMAL LOW (ref 78.0–100.0)
Platelets: 243 10*3/uL (ref 150–400)
Potassium: 3.9 meq/L (ref 3.5–5.3)
RBC: 4.8 M/uL (ref 3.87–5.11)
RDW: 16.6 % — ABNORMAL HIGH (ref 11.5–15.5)
Sodium: 142 meq/L (ref 135–145)
TSH: 2.029 microintl units/mL (ref 0.350–4.50)

## 2008-11-30 ENCOUNTER — Encounter: Payer: Self-pay | Admitting: Family Medicine

## 2008-12-03 ENCOUNTER — Telehealth: Payer: Self-pay | Admitting: Family Medicine

## 2008-12-03 ENCOUNTER — Ambulatory Visit: Payer: Self-pay | Admitting: Family Medicine

## 2008-12-07 ENCOUNTER — Telehealth: Payer: Self-pay | Admitting: Family Medicine

## 2008-12-07 ENCOUNTER — Encounter: Payer: Self-pay | Admitting: Family Medicine

## 2008-12-09 ENCOUNTER — Encounter: Payer: Self-pay | Admitting: Family Medicine

## 2008-12-09 LAB — CONVERTED CEMR LAB: Vit D, 25-Hydroxy: 20 ng/mL

## 2008-12-14 ENCOUNTER — Encounter: Payer: Self-pay | Admitting: Family Medicine

## 2009-01-05 IMAGING — CR DG CHEST 2V
2 series · 2 of 2 positions shown · non-contrast
Comparison: Chest radiograph 07/08/2008.  CT chest 08/22/2007

CLINICAL DATA: Cold and flu symptoms

CHEST - 2 VIEW

[view not recorded (1 of 2)]
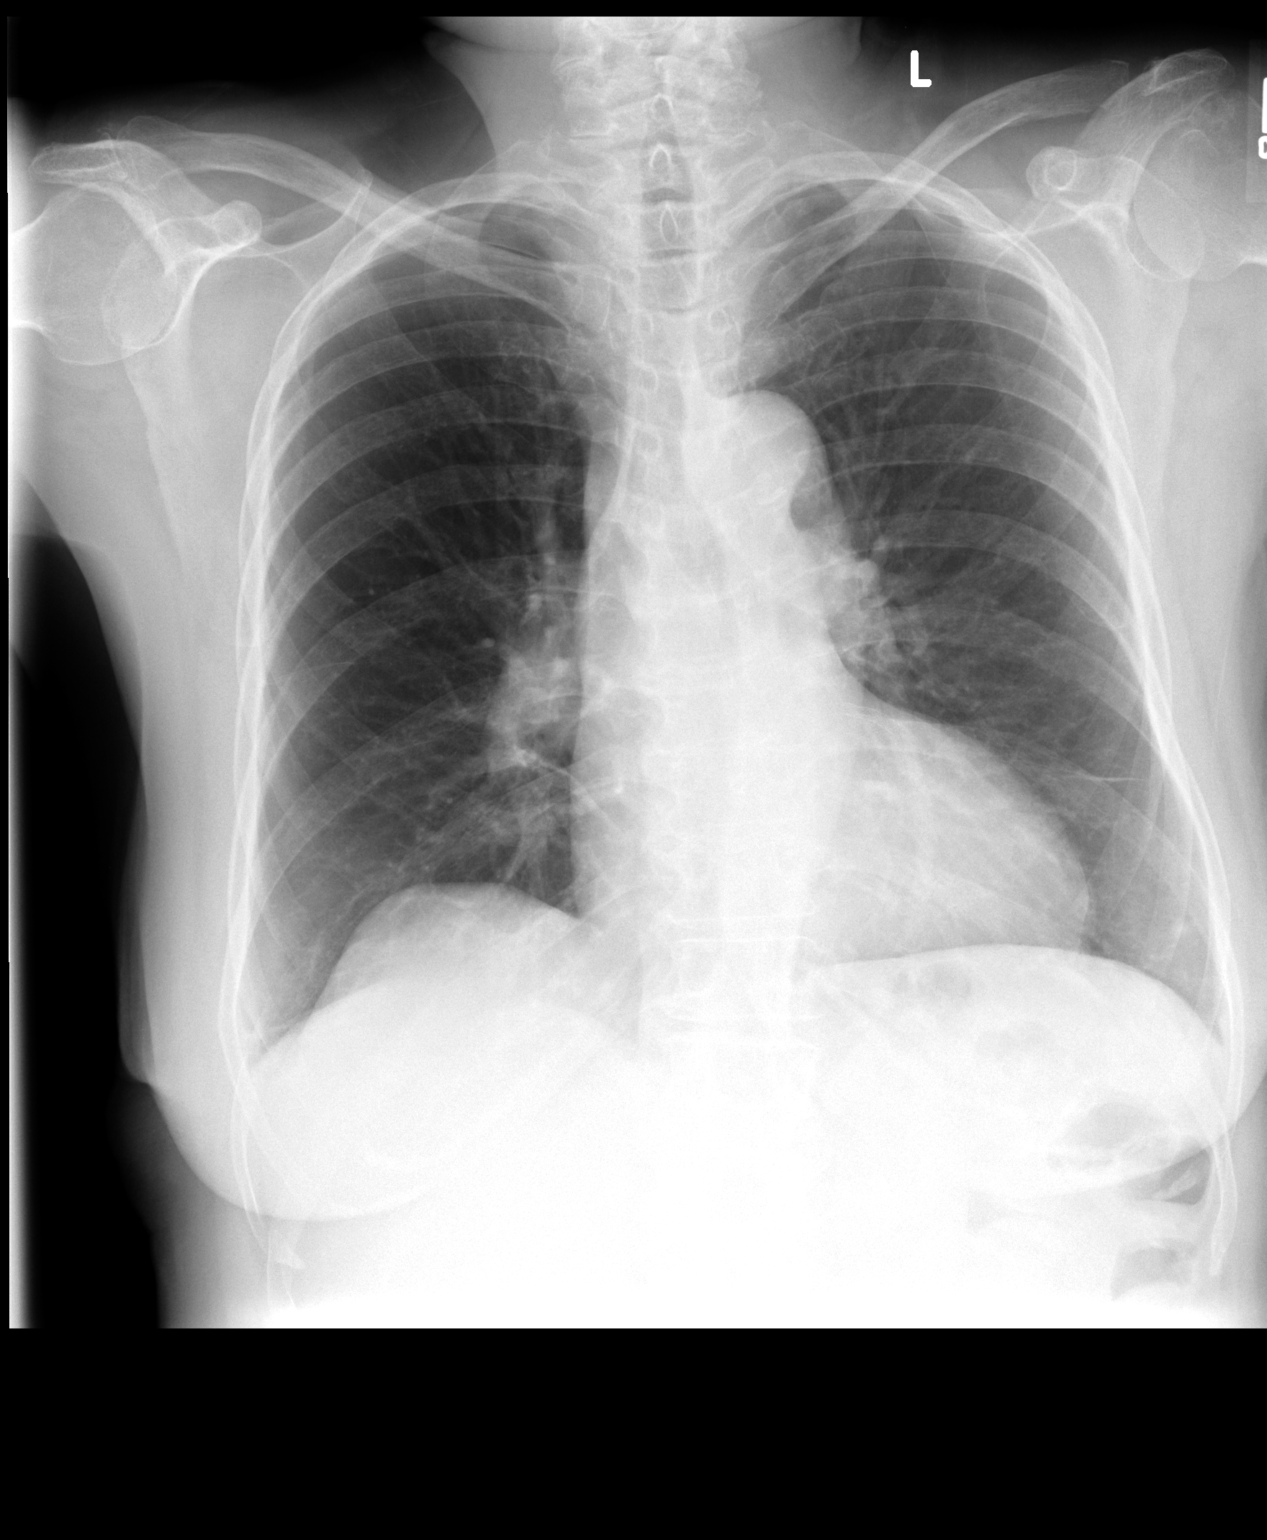

[view not recorded (2 of 2)]
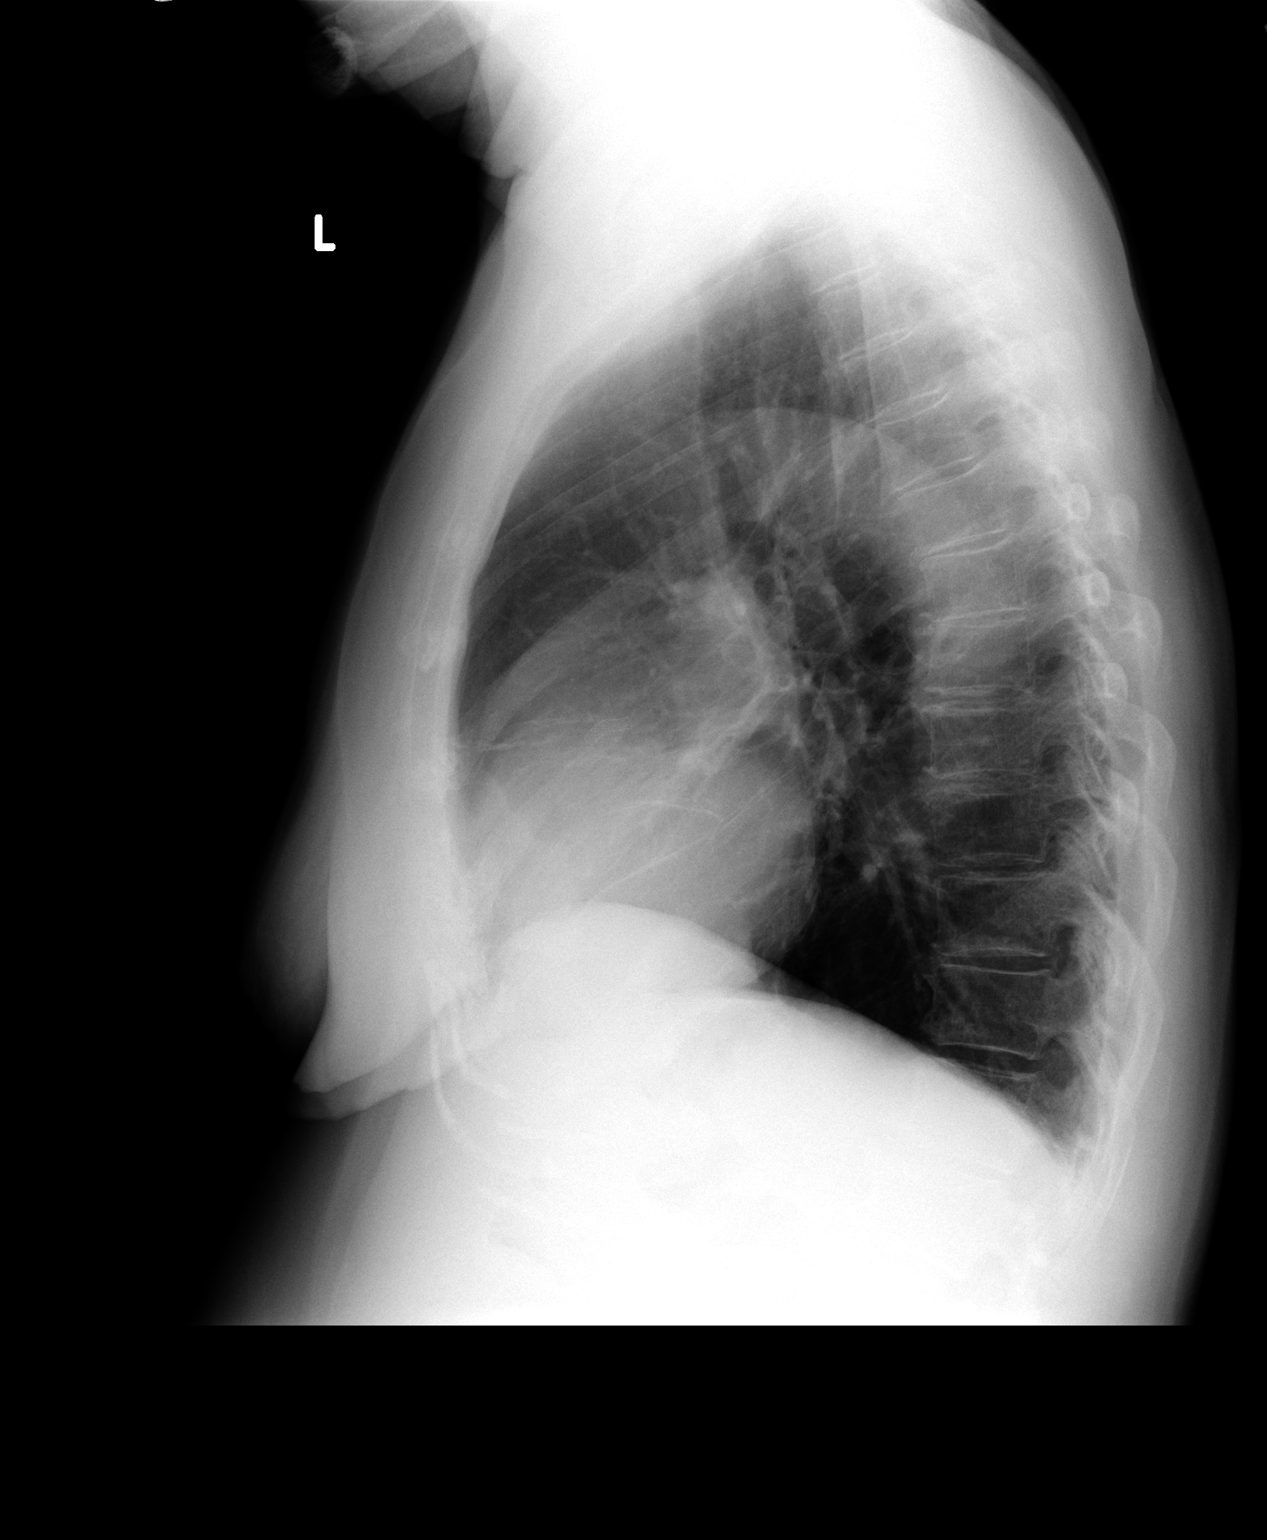

[2 of 2 positions shown; findings below may reference images not displayed]

FINDINGS: Cardiac silhouette is mildly enlarged and stable.  The
thoracic aortic contour remains tortuous and stable.  Pulmonary
vascularity is limits.  Linear markings at the lung bases
bilaterally are consistent with subsegmental atelectasis or linear
scarring.

No airspace disease is identified to suggest pneumonia.  Negative
for pleural effusion or pneumothorax.
IMPRESSION: 1.  No radiographic evidence of pneumonia is identified.
2.  Stable mild cardiomegaly.
3.  Subsegmental bibasilar atelectasis versus scarring.

## 2009-01-17 IMAGING — CR DG CHEST 1V PORT
1 series · 1 of 1 positions shown · non-contrast
Comparison: 11/14/2008

CLINICAL DATA: Shortness of breath, cough, and chest congestion for
2 days.

PORTABLE CHEST - 1 VIEW

[view not recorded]
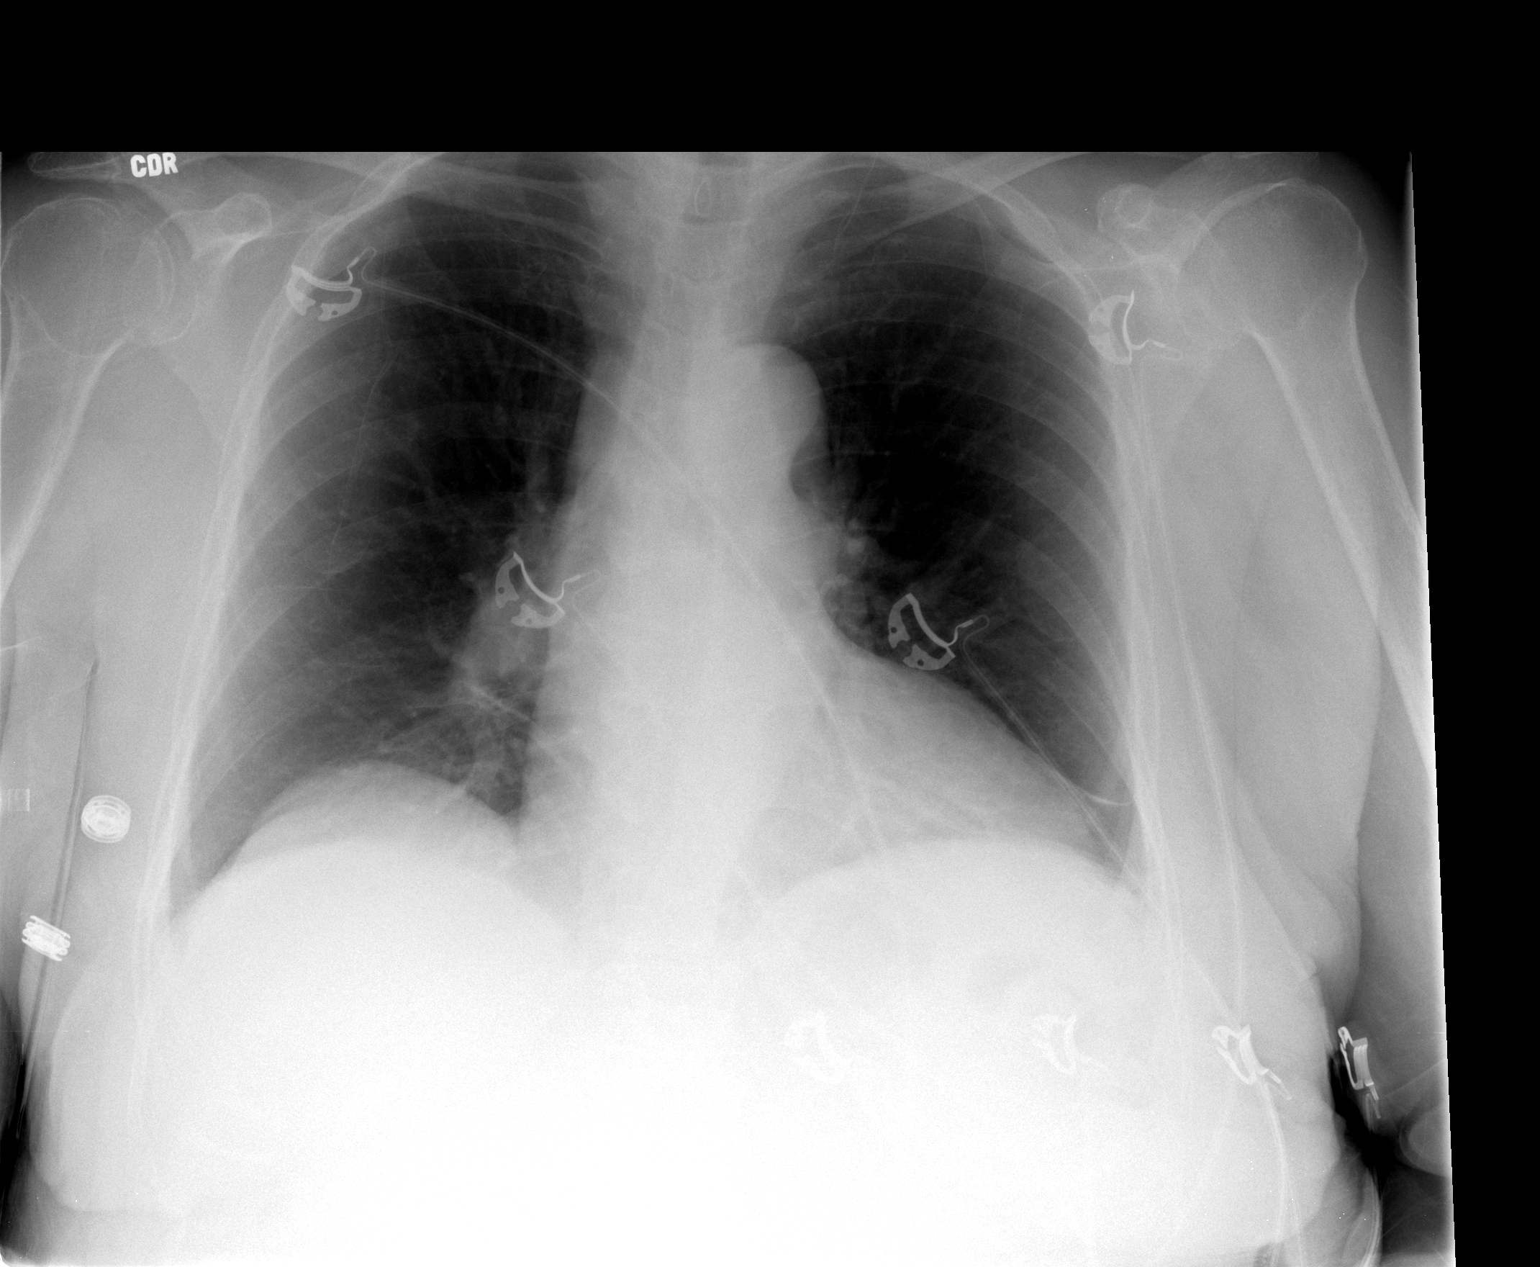

[1 of 1 positions shown; findings below may reference images not displayed]

FINDINGS: The heart size and vascularity are normal and the lungs
are clear except for some minimal scarring at both lung bases.  The
thoracic aorta is tortuous.  No acute bony abnormality.
IMPRESSION: No acute abnormalities.  No change since the prior exam.

## 2009-02-10 ENCOUNTER — Encounter: Payer: Self-pay | Admitting: Family Medicine

## 2009-02-18 ENCOUNTER — Telehealth: Payer: Self-pay | Admitting: Family Medicine

## 2009-02-19 ENCOUNTER — Telehealth: Payer: Self-pay | Admitting: *Deleted

## 2009-02-22 ENCOUNTER — Encounter: Payer: Self-pay | Admitting: Family Medicine

## 2009-03-15 ENCOUNTER — Ambulatory Visit: Payer: Self-pay | Admitting: Family Medicine

## 2009-03-15 ENCOUNTER — Encounter: Payer: Self-pay | Admitting: Family Medicine

## 2009-03-15 LAB — CONVERTED CEMR LAB
Hgb A1c MFr Bld: 8 %
Uric Acid, Serum: 5.9 mg/dL (ref 2.4–7.0)

## 2009-03-17 ENCOUNTER — Encounter: Payer: Self-pay | Admitting: Family Medicine

## 2009-04-06 ENCOUNTER — Ambulatory Visit: Payer: Self-pay | Admitting: Family Medicine

## 2009-04-06 ENCOUNTER — Ambulatory Visit (HOSPITAL_COMMUNITY): Admission: RE | Admit: 2009-04-06 | Discharge: 2009-04-06 | Payer: Self-pay | Admitting: Family Medicine

## 2009-04-06 ENCOUNTER — Encounter: Payer: Self-pay | Admitting: Family Medicine

## 2009-04-06 ENCOUNTER — Telehealth: Payer: Self-pay | Admitting: Family Medicine

## 2009-04-06 ENCOUNTER — Inpatient Hospital Stay (HOSPITAL_COMMUNITY): Admission: AD | Admit: 2009-04-06 | Discharge: 2009-04-07 | Payer: Self-pay | Admitting: Family Medicine

## 2009-04-06 LAB — CONVERTED CEMR LAB
Glucose, Urine, Semiquant: NEGATIVE
Nitrite: NEGATIVE
Specific Gravity, Urine: 1.02
WBC Urine, dipstick: NEGATIVE
pH: 7

## 2009-04-08 ENCOUNTER — Encounter: Payer: Self-pay | Admitting: Family Medicine

## 2009-04-28 ENCOUNTER — Encounter: Payer: Self-pay | Admitting: Family Medicine

## 2009-04-30 ENCOUNTER — Telehealth: Payer: Self-pay | Admitting: Family Medicine

## 2009-06-16 ENCOUNTER — Telehealth (INDEPENDENT_AMBULATORY_CARE_PROVIDER_SITE_OTHER): Payer: Self-pay | Admitting: *Deleted

## 2009-06-18 ENCOUNTER — Emergency Department (HOSPITAL_COMMUNITY): Admission: EM | Admit: 2009-06-18 | Discharge: 2009-06-18 | Payer: Self-pay | Admitting: Family Medicine

## 2009-06-18 ENCOUNTER — Telehealth: Payer: Self-pay | Admitting: Family Medicine

## 2009-07-06 ENCOUNTER — Ambulatory Visit: Payer: Self-pay | Admitting: Family Medicine

## 2009-08-23 ENCOUNTER — Telehealth: Payer: Self-pay | Admitting: *Deleted

## 2009-08-25 ENCOUNTER — Ambulatory Visit: Payer: Self-pay | Admitting: Family Medicine

## 2009-08-25 DIAGNOSIS — G629 Polyneuropathy, unspecified: Secondary | ICD-10-CM

## 2009-08-27 ENCOUNTER — Encounter: Admission: RE | Admit: 2009-08-27 | Discharge: 2009-08-27 | Payer: Self-pay | Admitting: *Deleted

## 2009-10-13 ENCOUNTER — Encounter: Payer: Self-pay | Admitting: Family Medicine

## 2009-10-14 ENCOUNTER — Telehealth: Payer: Self-pay | Admitting: Family Medicine

## 2009-10-18 IMAGING — CR DG LUMBAR SPINE COMPLETE 4+V
5 series · 5 of 5 positions shown · non-contrast
Comparison: 08/04/2008.

CLINICAL DATA: Low back and bilateral leg pain.  No known injury.

LUMBAR SPINE - COMPLETE 4+ VIEW

[view not recorded (1 of 5)]
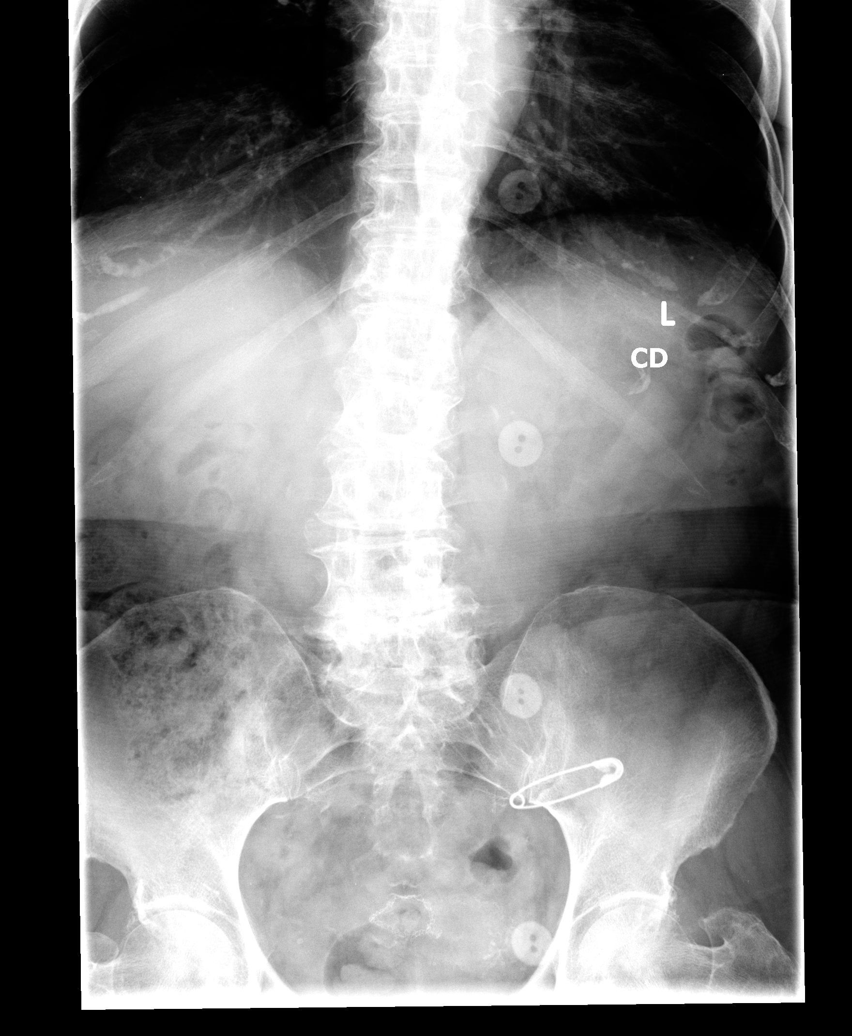

[view not recorded (2 of 5)]
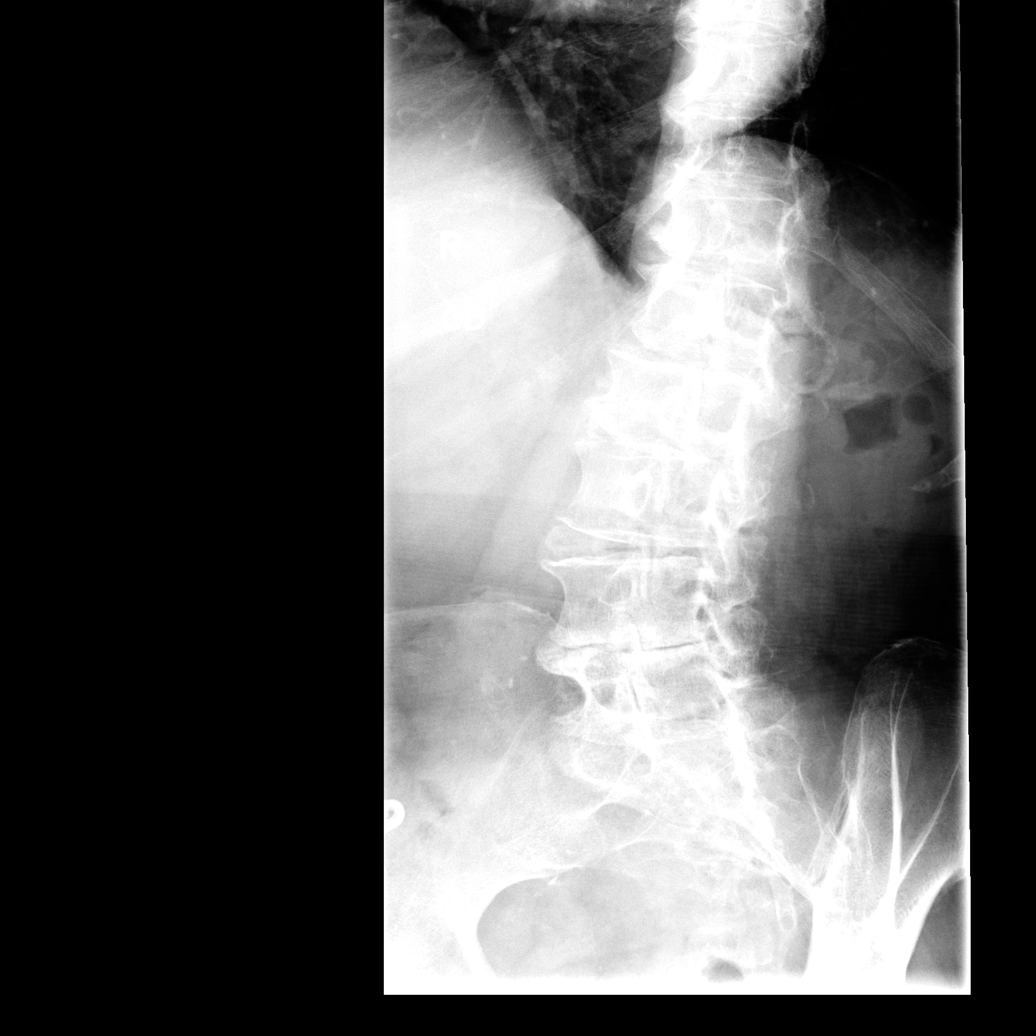

[view not recorded (3 of 5)]
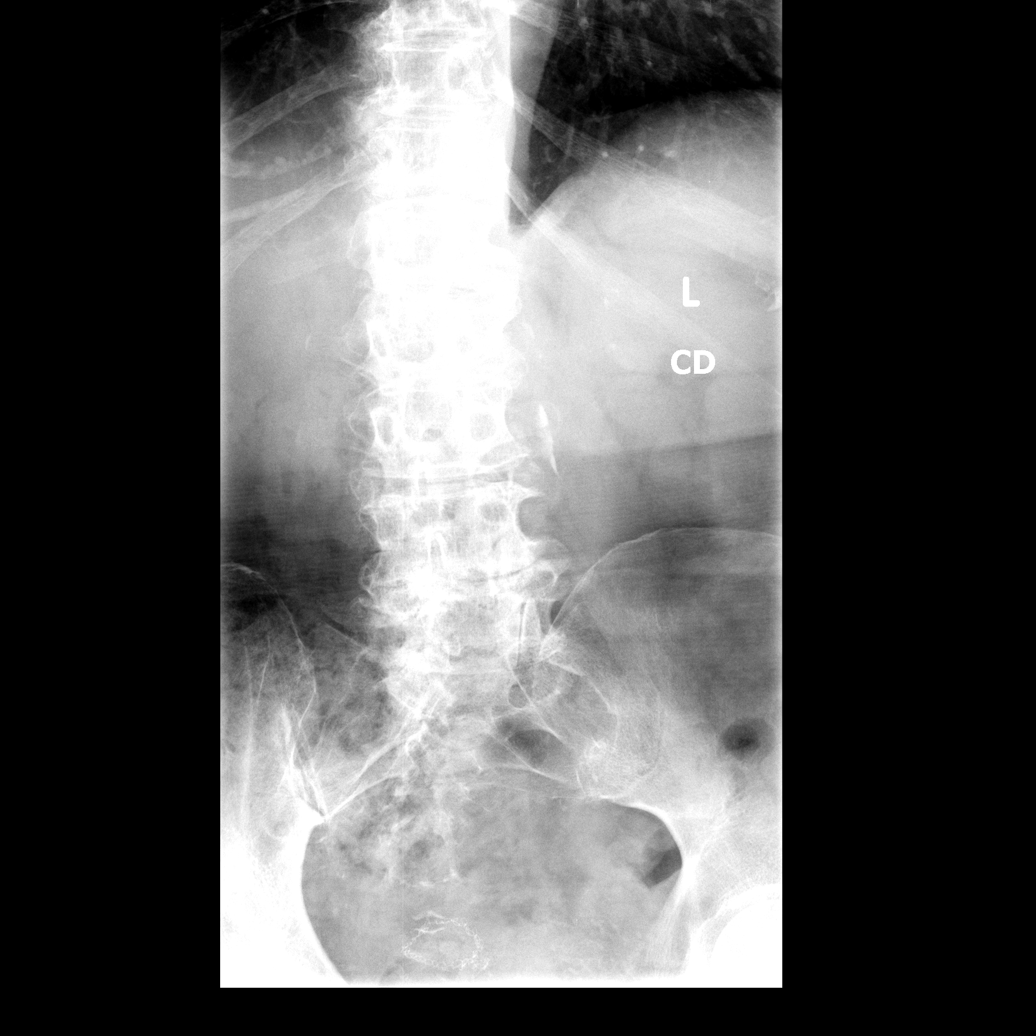

[view not recorded (4 of 5)]
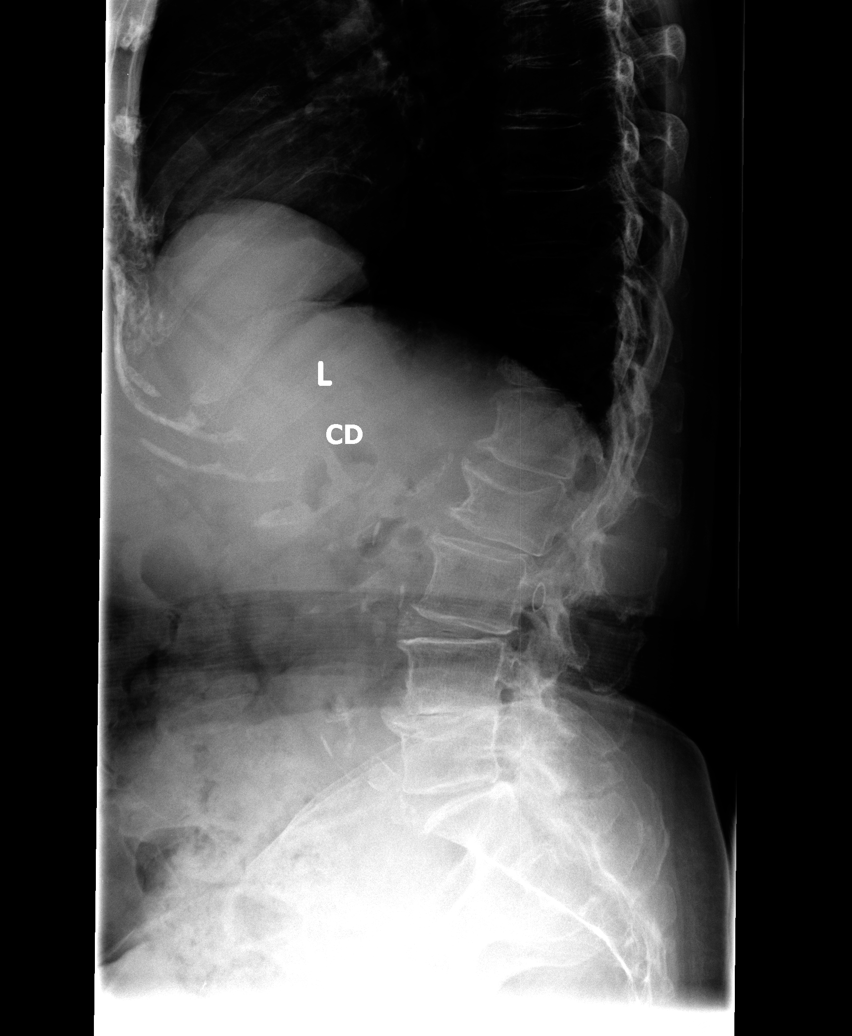

[view not recorded (5 of 5)]
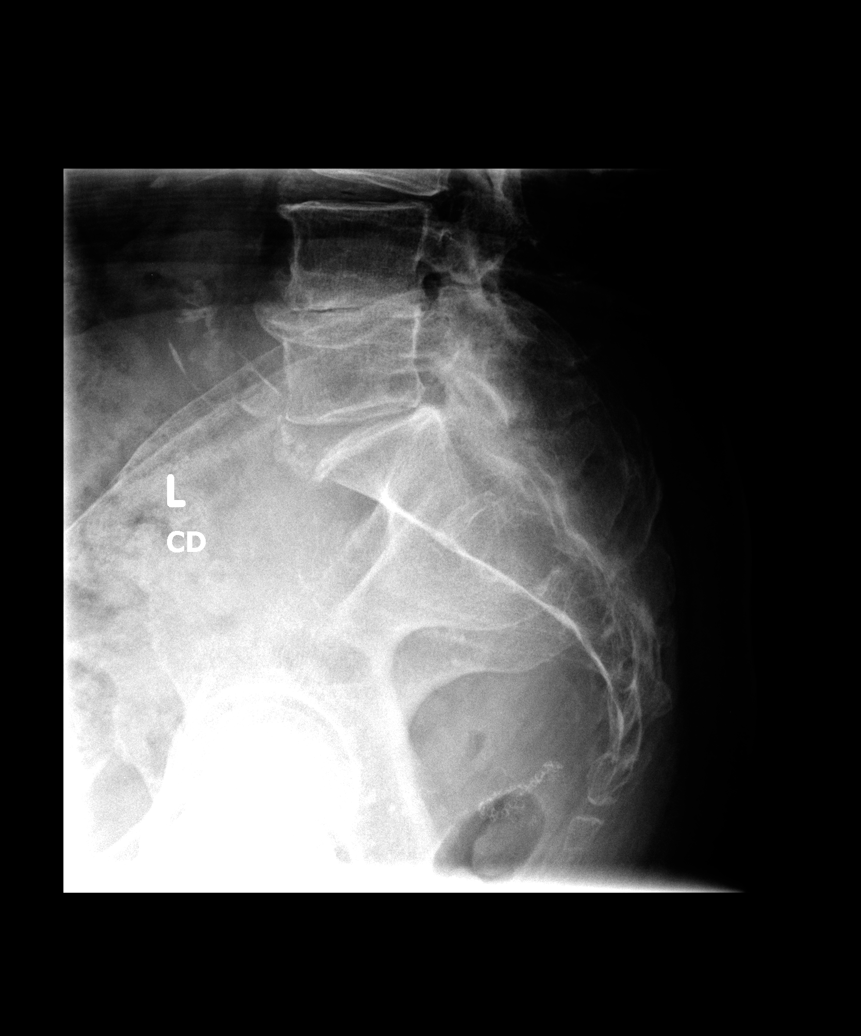

[5 of 5 positions shown; findings below may reference images not displayed]

FINDINGS: Again demonstrated is a transitional thoracolumbar
vertebra followed by four non-rib bearing lumbar vertebrae.  The
transitional vertebra was previously labeled L1.  This will again
be labeled L1, with the last open disc space at the L5-S1 level.

There has been no significant change in an approximately 30%
superior endplate compression deformity of the L2 vertebral body
without bony retropulsion.  Stable marked disc space narrowing with
discogenic sclerosis at the L4-5 level.  Stable grade 1
retrolisthesis at the L2-3 level and minimal grade 1 retrolisthesis
at the L3-4 and L4-5 levels.  Facet degenerative changes and
anterior and lateral spurs are again demonstrated at multiple
levels.  No pars defects are seen.  Mild dextroconvex lumbar rotary
scoliosis is unchanged.  Atheromatous arterial calcifications.
IMPRESSION: Stable degenerative and old L2 compression fracture, as described
above.  No acute abnormality.

## 2009-10-21 ENCOUNTER — Encounter: Payer: Self-pay | Admitting: Family Medicine

## 2009-10-21 ENCOUNTER — Ambulatory Visit: Payer: Self-pay | Admitting: Family Medicine

## 2009-10-21 LAB — CONVERTED CEMR LAB
Albumin: 4.1 g/dL (ref 3.5–5.2)
Bilirubin Urine: NEGATIVE
Blood in Urine, dipstick: NEGATIVE
CO2: 25 meq/L (ref 19–32)
Calcium: 9.8 mg/dL (ref 8.4–10.5)
Chloride: 104 meq/L (ref 96–112)
Direct LDL: 99 mg/dL
Glucose, Bld: 114 mg/dL — ABNORMAL HIGH (ref 70–99)
Glucose, Urine, Semiquant: NEGATIVE
Ketones, urine, test strip: NEGATIVE
MCV: 72.7 fL — ABNORMAL LOW (ref 78.0–100.0)
Nitrite: NEGATIVE
RBC: 4.61 M/uL (ref 3.87–5.11)
Sodium: 142 meq/L (ref 135–145)
Specific Gravity, Urine: 1.02
Total Bilirubin: 0.3 mg/dL (ref 0.3–1.2)
Total Protein: 7.5 g/dL (ref 6.0–8.3)
Urobilinogen, UA: 1
WBC: 4.9 10*3/uL (ref 4.0–10.5)

## 2009-10-22 ENCOUNTER — Encounter: Payer: Self-pay | Admitting: Family Medicine

## 2009-10-25 ENCOUNTER — Encounter: Payer: Self-pay | Admitting: Family Medicine

## 2009-11-13 DIAGNOSIS — C50919 Malignant neoplasm of unspecified site of unspecified female breast: Secondary | ICD-10-CM

## 2009-11-13 HISTORY — DX: Malignant neoplasm of unspecified site of unspecified female breast: C50.919

## 2009-11-15 ENCOUNTER — Encounter (INDEPENDENT_AMBULATORY_CARE_PROVIDER_SITE_OTHER): Payer: Self-pay | Admitting: *Deleted

## 2009-11-24 ENCOUNTER — Encounter: Payer: Self-pay | Admitting: Family Medicine

## 2009-11-29 ENCOUNTER — Encounter: Payer: Self-pay | Admitting: Family Medicine

## 2009-12-08 ENCOUNTER — Ambulatory Visit: Payer: Self-pay | Admitting: Family Medicine

## 2009-12-23 ENCOUNTER — Emergency Department (HOSPITAL_COMMUNITY): Admission: EM | Admit: 2009-12-23 | Discharge: 2009-12-23 | Payer: Self-pay | Admitting: Emergency Medicine

## 2009-12-23 ENCOUNTER — Emergency Department (HOSPITAL_COMMUNITY): Admission: EM | Admit: 2009-12-23 | Discharge: 2009-12-23 | Payer: Self-pay | Admitting: Family Medicine

## 2009-12-26 ENCOUNTER — Emergency Department (HOSPITAL_COMMUNITY): Admission: EM | Admit: 2009-12-26 | Discharge: 2009-12-26 | Payer: Self-pay | Admitting: Emergency Medicine

## 2010-01-12 ENCOUNTER — Telehealth: Payer: Self-pay | Admitting: Family Medicine

## 2010-01-26 ENCOUNTER — Telehealth: Payer: Self-pay | Admitting: Family Medicine

## 2010-02-03 ENCOUNTER — Encounter: Payer: Self-pay | Admitting: Family Medicine

## 2010-02-03 LAB — CONVERTED CEMR LAB: WBC: 4.1 10*3/uL

## 2010-02-04 ENCOUNTER — Encounter: Payer: Self-pay | Admitting: Family Medicine

## 2010-02-10 ENCOUNTER — Ambulatory Visit: Payer: Self-pay | Admitting: Family Medicine

## 2010-02-13 IMAGING — CR DG CHEST 2V
1 series · 1 of 1 positions shown · non-contrast
Comparison: Chest radiograph performed 11/26/2008

CLINICAL DATA: Chest tightness; hypertension.  Side and back pain.

CHEST - 2 VIEW

[w chest lat]
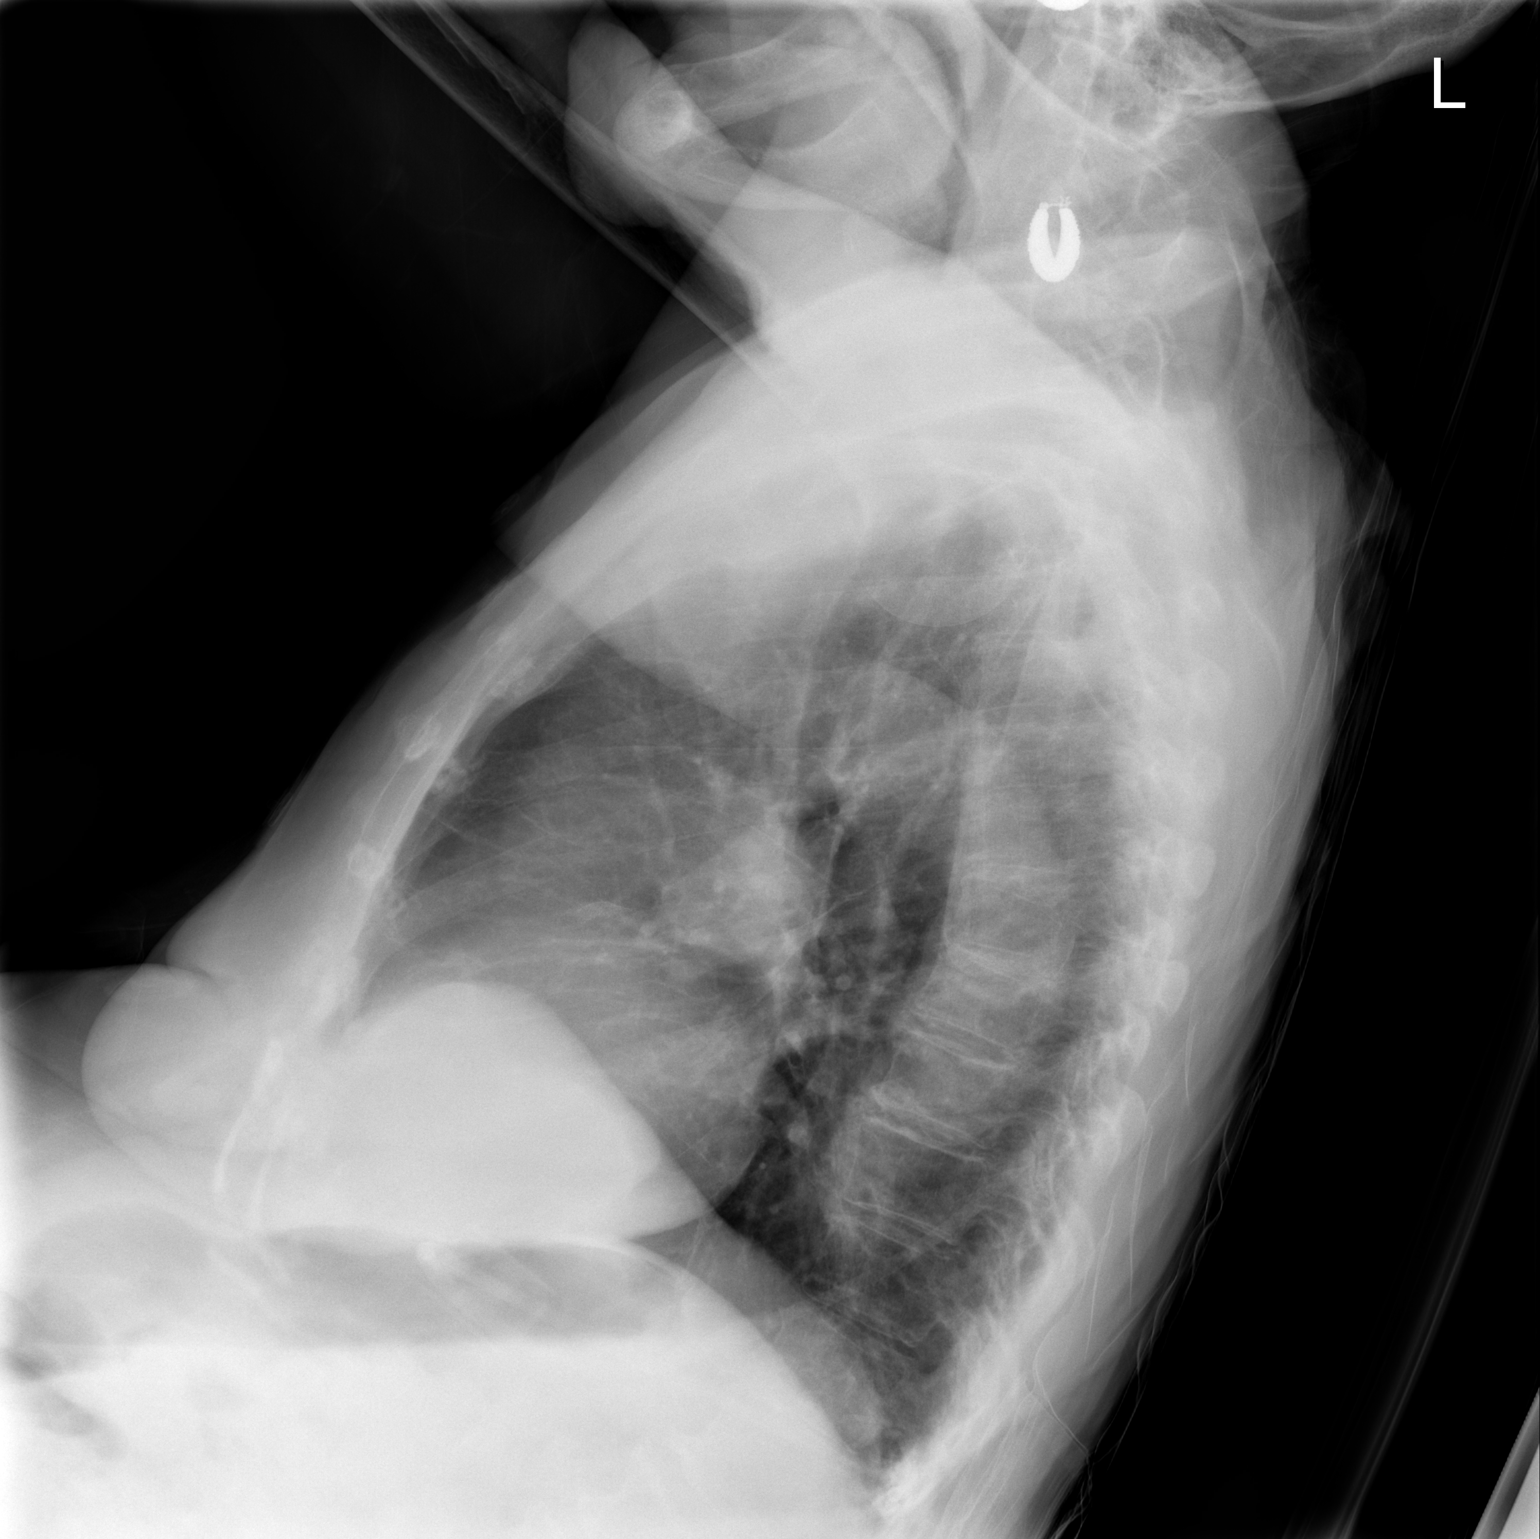

[1 of 1 positions shown; findings below may reference images not displayed]

FINDINGS: The lungs are well-aerated and clear.  There is no
evidence of focal opacification, pleural effusion or pneumothorax.

The heart is normal in size; the mediastinal contour is within
normal limits.  Calcification is noted within the aortic arch.  No
acute osseous abnormalities are seen.
IMPRESSION: No acute cardiopulmonary process seen.

## 2010-02-16 IMAGING — CR DG LUMBAR SPINE COMPLETE 4+V
5 series · 5 of 5 positions shown · non-contrast
Comparison: Lumbar spine x-rays 08/27/2009 and 08/04/2008.

CLINICAL DATA: Low back pain on the right for the past 5 days.  No
recent injuries.

LUMBAR SPINE - COMPLETE 4+ VIEW 12/26/2009:

[t l-spine a.p.]
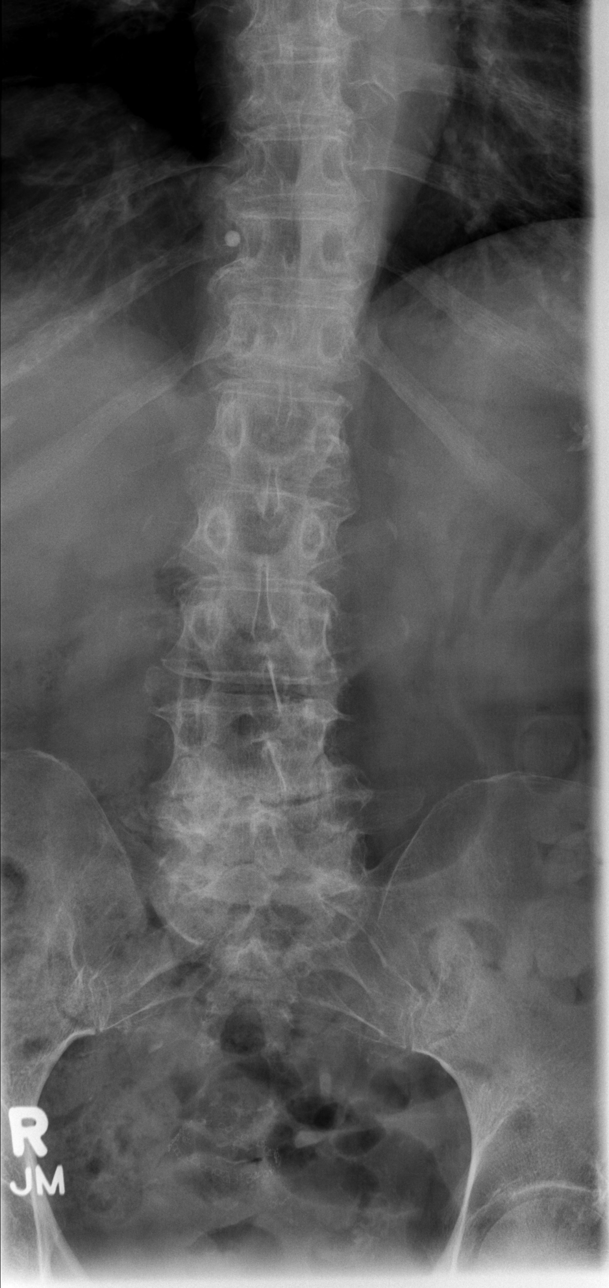

[t l-spine oblique exposure (1 of 2)]
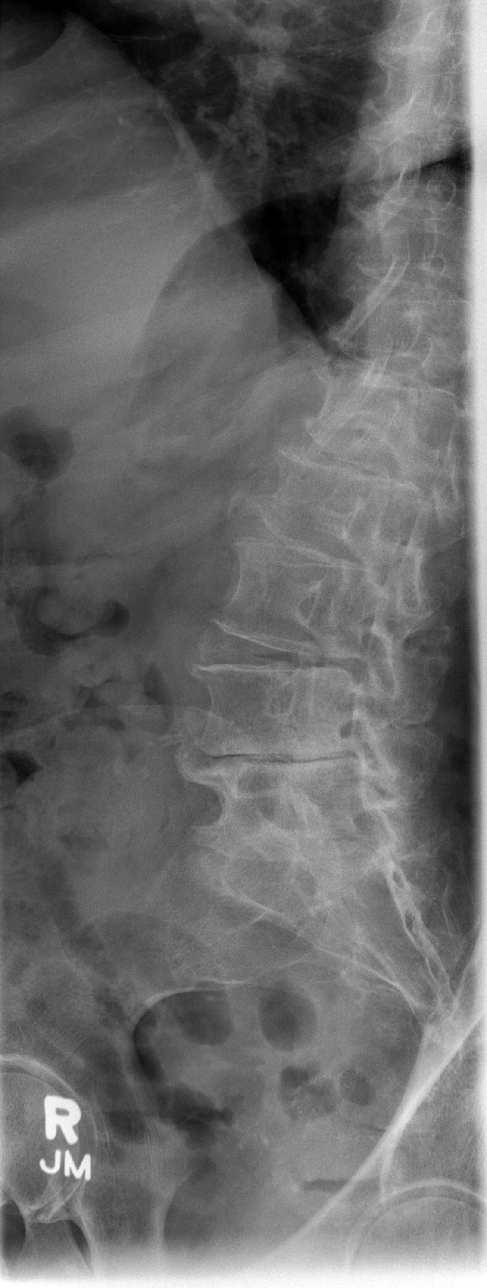

[t l-spine oblique exposure (2 of 2)]
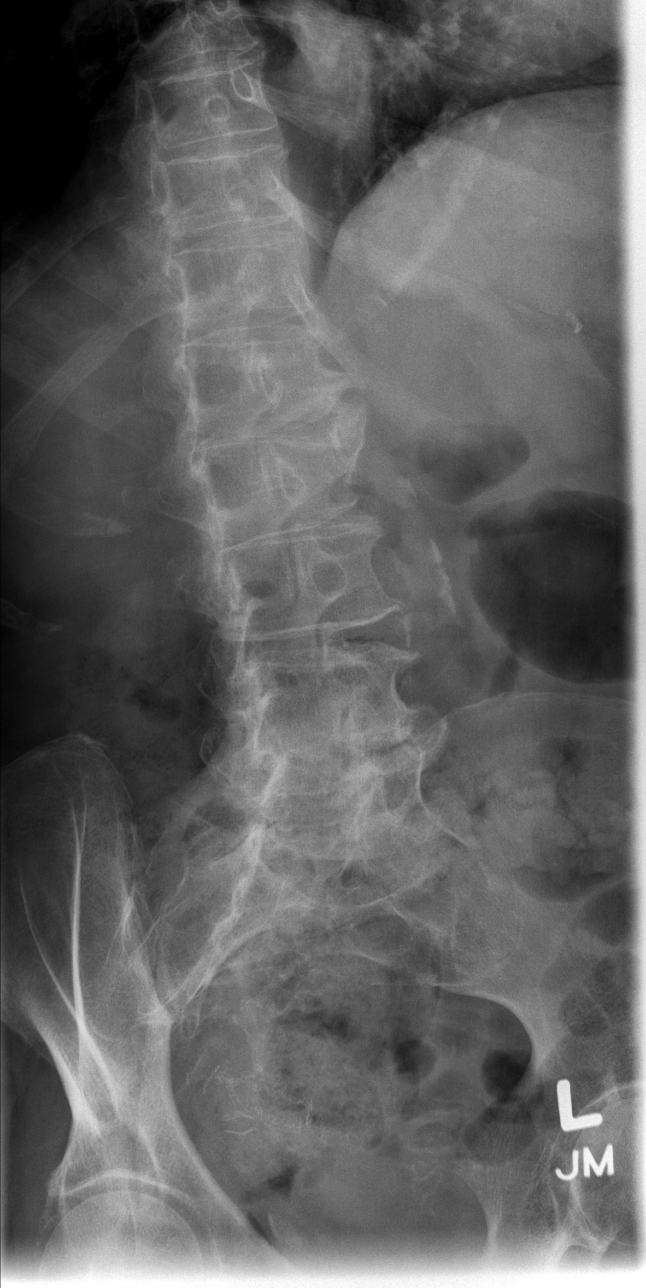

[t l-spine lat]
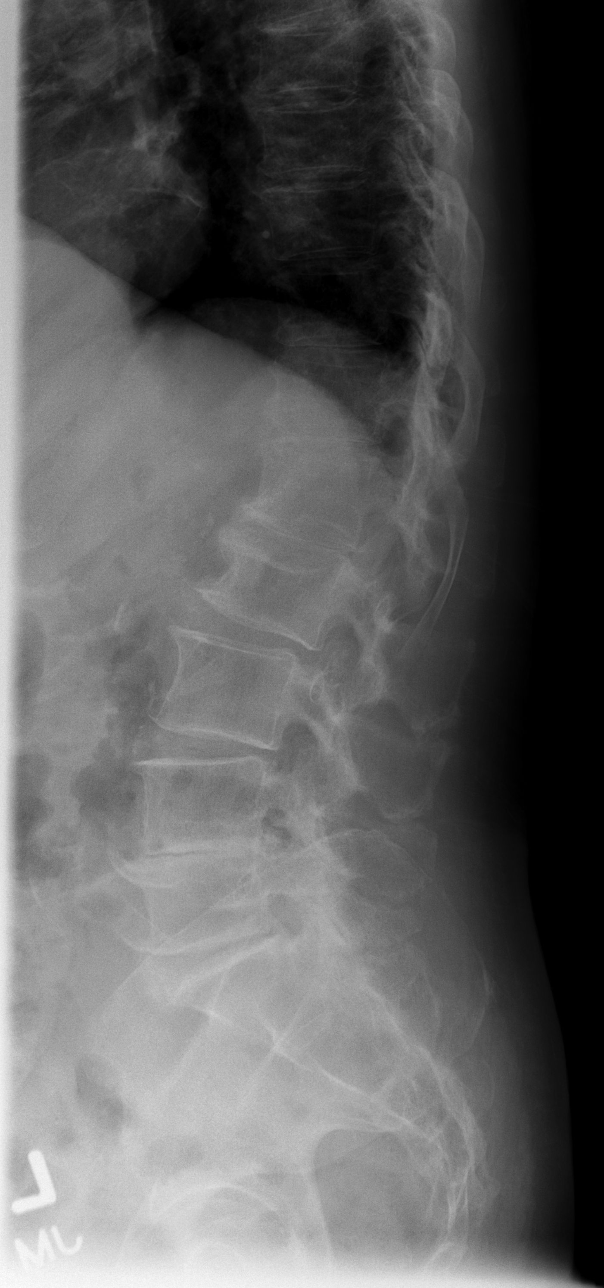

[t l-spine l5-s1 spot]
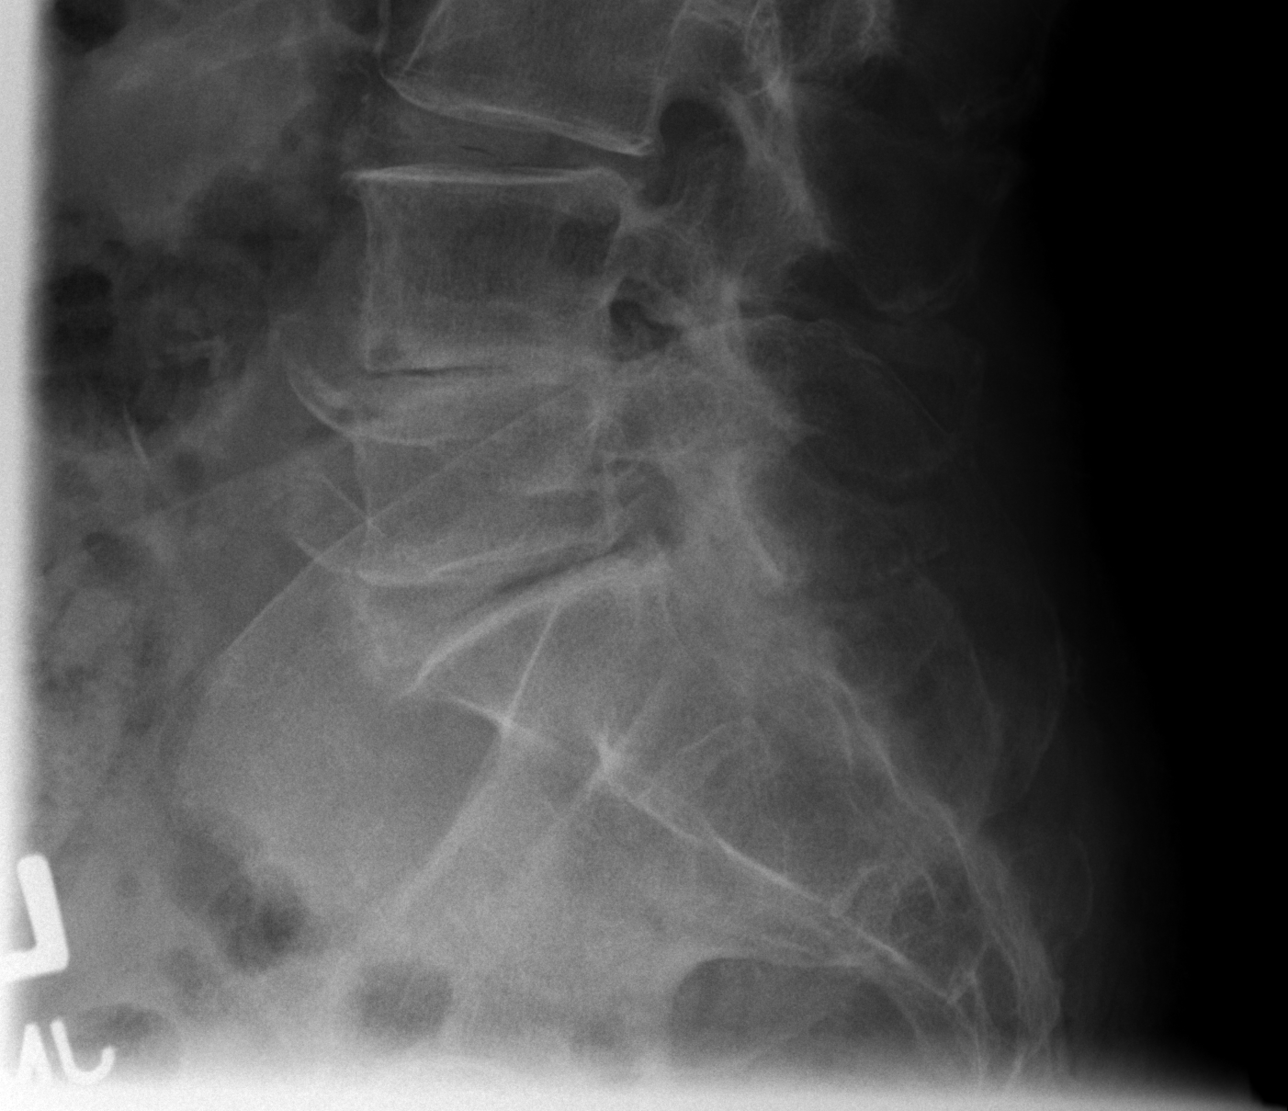

[5 of 5 positions shown; findings below may reference images not displayed]

FINDINGS: Five non-rib bearing lumbar vertebra with anatomic
posterior alignment.  Mild concave compression deformity of the
upper endplate of L2, unchanged.  No new/acute fractures involving
the visualized lower thoracic or lumbar spine.  Severe disc space
narrowing and endplate hypertrophic changes at L4-5, with similar
moderate changes at L5-S1 and similar mild changes at L2-3 and L3-
4.  No pars defects.  Facet degenerative changes at L4-5 and L5-S1.
Visualized sacroiliac joints intact.  No significant interval
change.
IMPRESSION: No acute or subacute skeletal abnormalities.  Old mild concave
fracture of the upper endplate of L2.  Diffuse degenerative disc
disease and spondylosis, worst at L4-5 and L5-S1.  Stable since
July 2008.

## 2010-02-16 IMAGING — CR DG HIP COMPLETE 2+V*R*
3 series · 3 of 3 positions shown · non-contrast
Comparison: Right hip x-ray 06/06/2004.

CLINICAL DATA: Right hip pain for the past 5 days.  No recent
injuries.

RIGHT HIP - COMPLETE 2+ VIEW 12/26/2009:

[t pelvis a.p.]
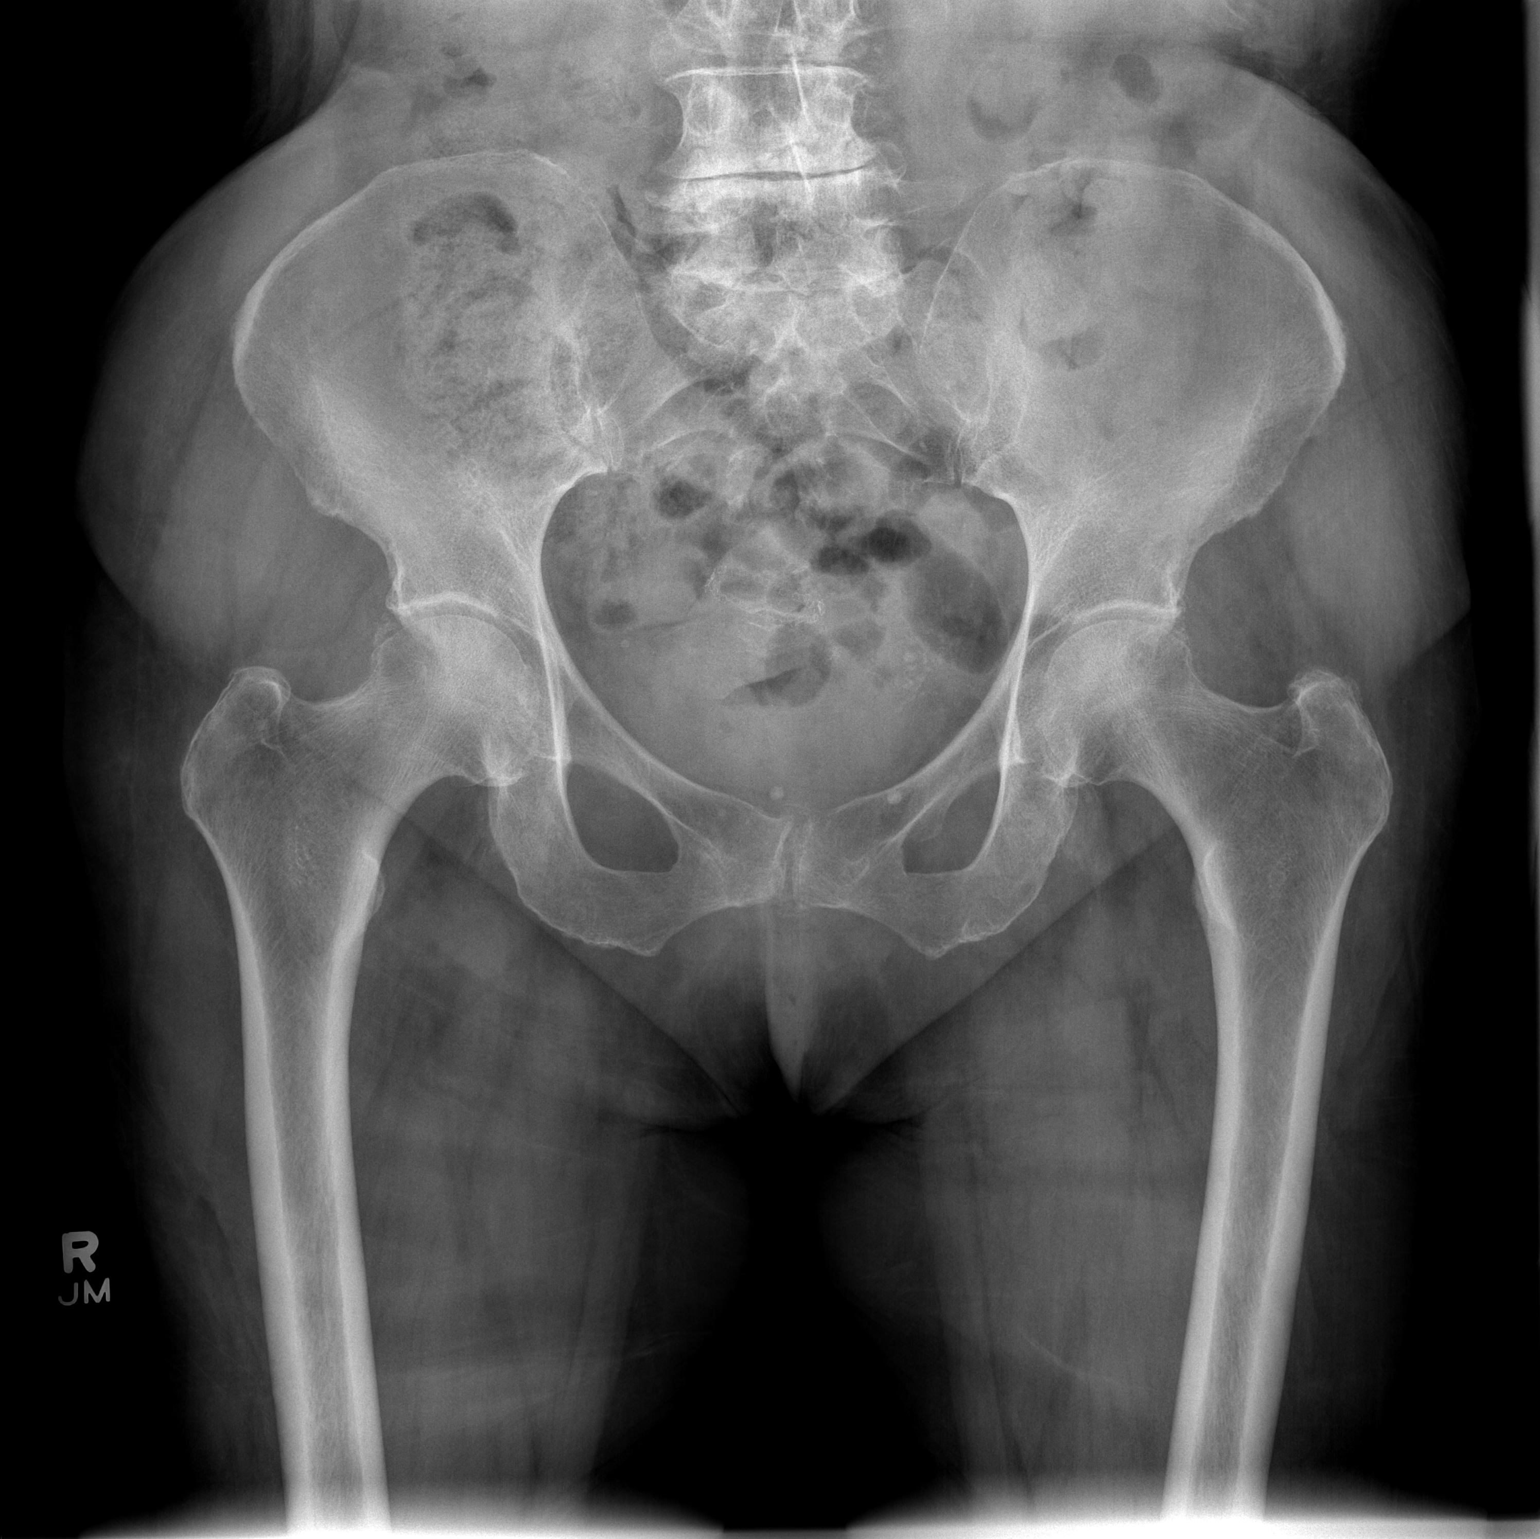

[t hip ap right]
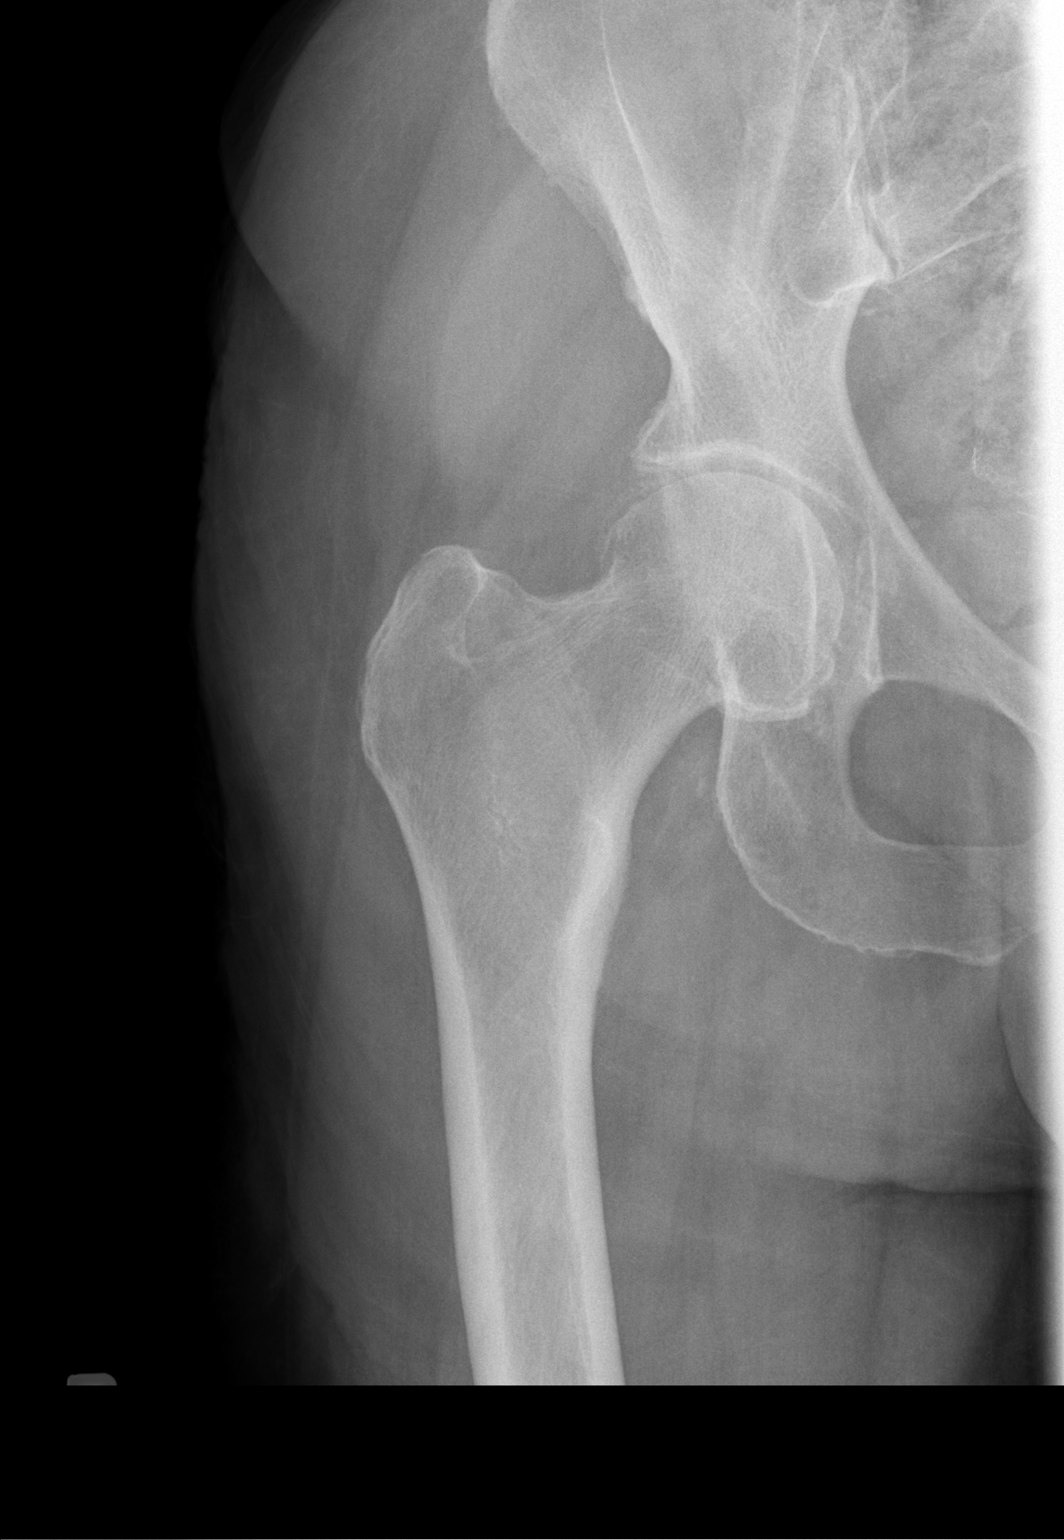

[t hip frog leg right]
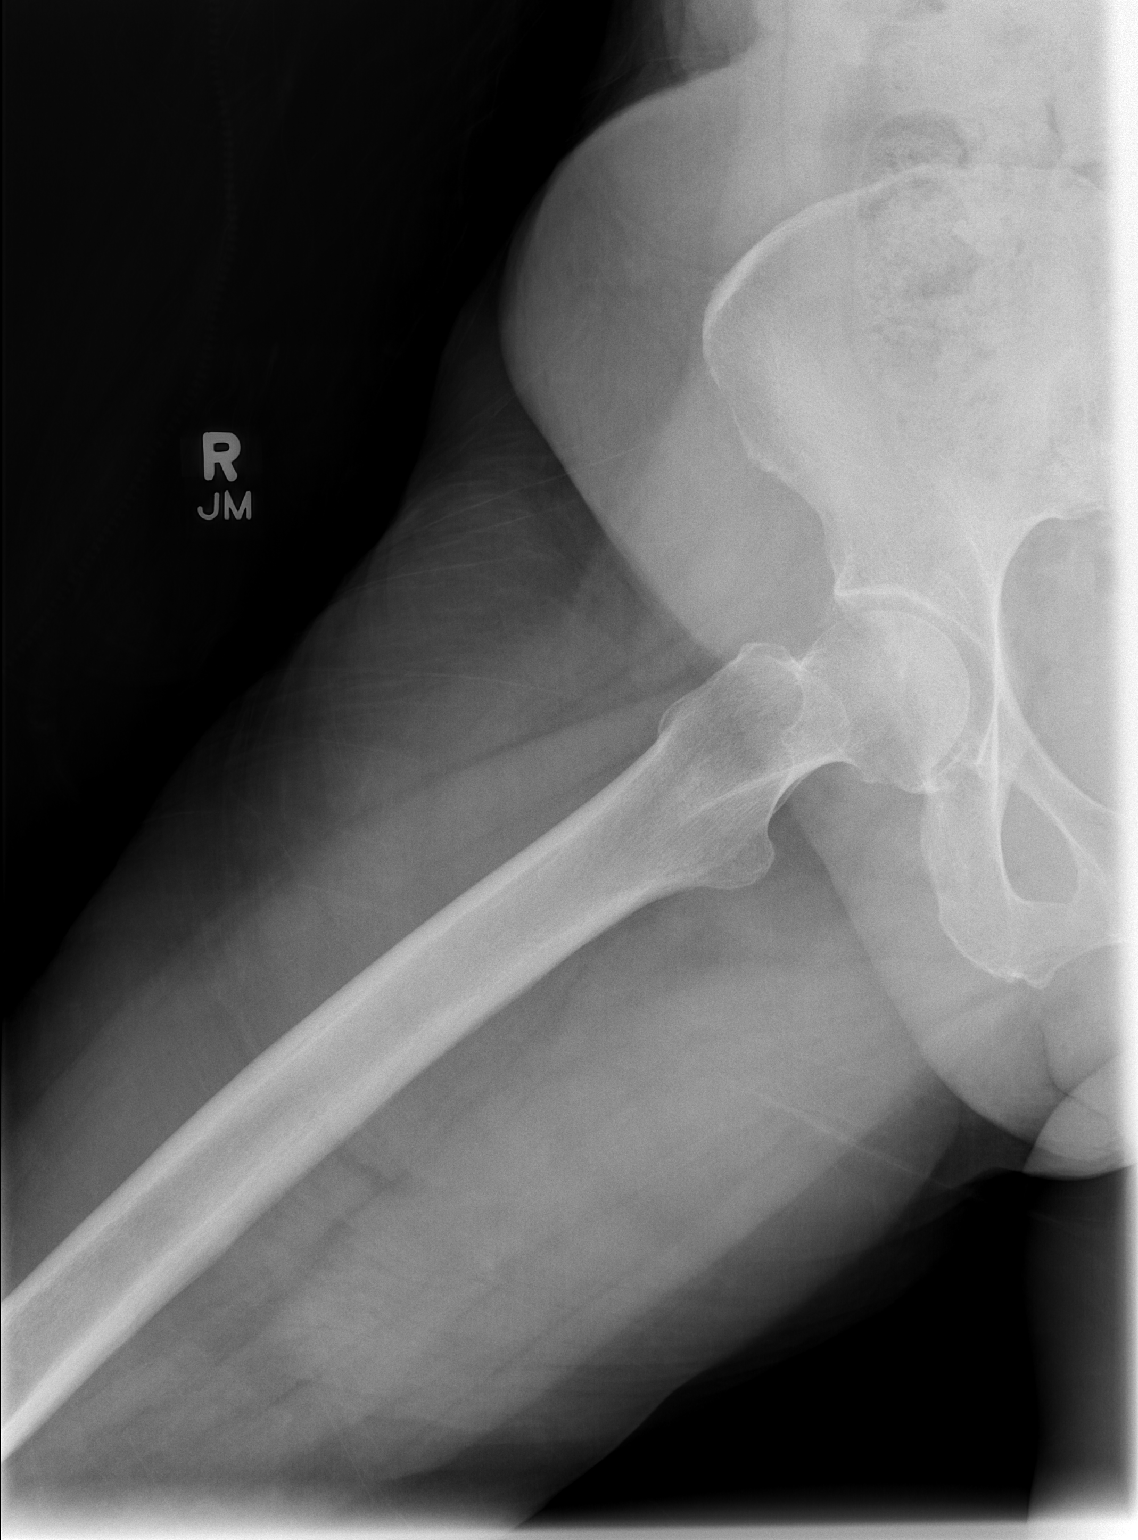

[3 of 3 positions shown; findings below may reference images not displayed]

FINDINGS: Mild to moderate narrowing of the joint space in the
right hip, slightly progressive since 6880.  No evidence of acute
or subacute fracture or dislocation.  Well-preserved bone mineral
density for age.

Included AP pelvis shows symmetric mild degenerative changes in the
left hip.  Sacroiliac joints and symphysis pubis intact with only
mild degenerative changes.  No fractures within the bony pelvis.
IMPRESSION: No acute or subacute skeletal abnormalities.  Mild to moderate
osteoarthritis in the right hip, slightly progressive since [DATE],

## 2010-04-14 ENCOUNTER — Encounter: Payer: Self-pay | Admitting: Family Medicine

## 2010-05-05 ENCOUNTER — Ambulatory Visit: Payer: Self-pay | Admitting: Family Medicine

## 2010-05-18 ENCOUNTER — Telehealth: Payer: Self-pay | Admitting: Family Medicine

## 2010-07-13 ENCOUNTER — Encounter: Payer: Self-pay | Admitting: Family Medicine

## 2010-07-22 ENCOUNTER — Emergency Department (HOSPITAL_COMMUNITY): Admission: EM | Admit: 2010-07-22 | Discharge: 2010-07-22 | Payer: Self-pay | Admitting: Emergency Medicine

## 2010-07-26 ENCOUNTER — Telehealth: Payer: Self-pay | Admitting: *Deleted

## 2010-08-02 ENCOUNTER — Encounter: Payer: Self-pay | Admitting: Family Medicine

## 2010-08-05 ENCOUNTER — Ambulatory Visit: Payer: Self-pay | Admitting: Family Medicine

## 2010-08-05 LAB — CONVERTED CEMR LAB: Hgb A1c MFr Bld: 7.1 %

## 2010-08-12 ENCOUNTER — Emergency Department (HOSPITAL_COMMUNITY): Admission: EM | Admit: 2010-08-12 | Discharge: 2010-08-12 | Payer: Self-pay | Admitting: Family Medicine

## 2010-08-13 DIAGNOSIS — I720 Aneurysm of carotid artery: Secondary | ICD-10-CM

## 2010-08-13 HISTORY — DX: Aneurysm of carotid artery: I72.0

## 2010-08-17 ENCOUNTER — Inpatient Hospital Stay (HOSPITAL_COMMUNITY): Admission: EM | Admit: 2010-08-17 | Discharge: 2010-08-19 | Payer: Self-pay | Admitting: Family Medicine

## 2010-08-17 ENCOUNTER — Encounter: Payer: Self-pay | Admitting: Emergency Medicine

## 2010-08-17 ENCOUNTER — Ambulatory Visit: Payer: Self-pay | Admitting: Family Medicine

## 2010-08-17 ENCOUNTER — Encounter: Payer: Self-pay | Admitting: Family Medicine

## 2010-08-18 ENCOUNTER — Encounter: Payer: Self-pay | Admitting: Family Medicine

## 2010-08-18 LAB — CONVERTED CEMR LAB
Cholesterol: 119 mg/dL
HDL: 36 mg/dL
LDL Cholesterol: 67 mg/dL
Triglycerides: 79 mg/dL

## 2010-08-22 ENCOUNTER — Ambulatory Visit: Payer: Self-pay | Admitting: Family Medicine

## 2010-08-22 ENCOUNTER — Encounter: Payer: Self-pay | Admitting: Family Medicine

## 2010-08-22 DIAGNOSIS — G450 Vertebro-basilar artery syndrome: Secondary | ICD-10-CM

## 2010-08-23 ENCOUNTER — Telehealth: Payer: Self-pay | Admitting: Family Medicine

## 2010-08-28 ENCOUNTER — Encounter: Payer: Self-pay | Admitting: Family Medicine

## 2010-09-02 ENCOUNTER — Encounter: Admission: RE | Admit: 2010-09-02 | Discharge: 2010-09-02 | Payer: Self-pay | Admitting: Family Medicine

## 2010-09-08 ENCOUNTER — Ambulatory Visit: Payer: Self-pay | Admitting: Family Medicine

## 2010-09-09 ENCOUNTER — Encounter (INDEPENDENT_AMBULATORY_CARE_PROVIDER_SITE_OTHER): Payer: Self-pay | Admitting: Pharmacist

## 2010-09-12 IMAGING — CR DG FOOT COMPLETE 3+V*L*
3 series · 3 of 3 positions shown · non-contrast
Comparison: None.

CLINICAL DATA: Foot pain

LEFT FOOT - COMPLETE 3+ VIEW

[t foot lat right]
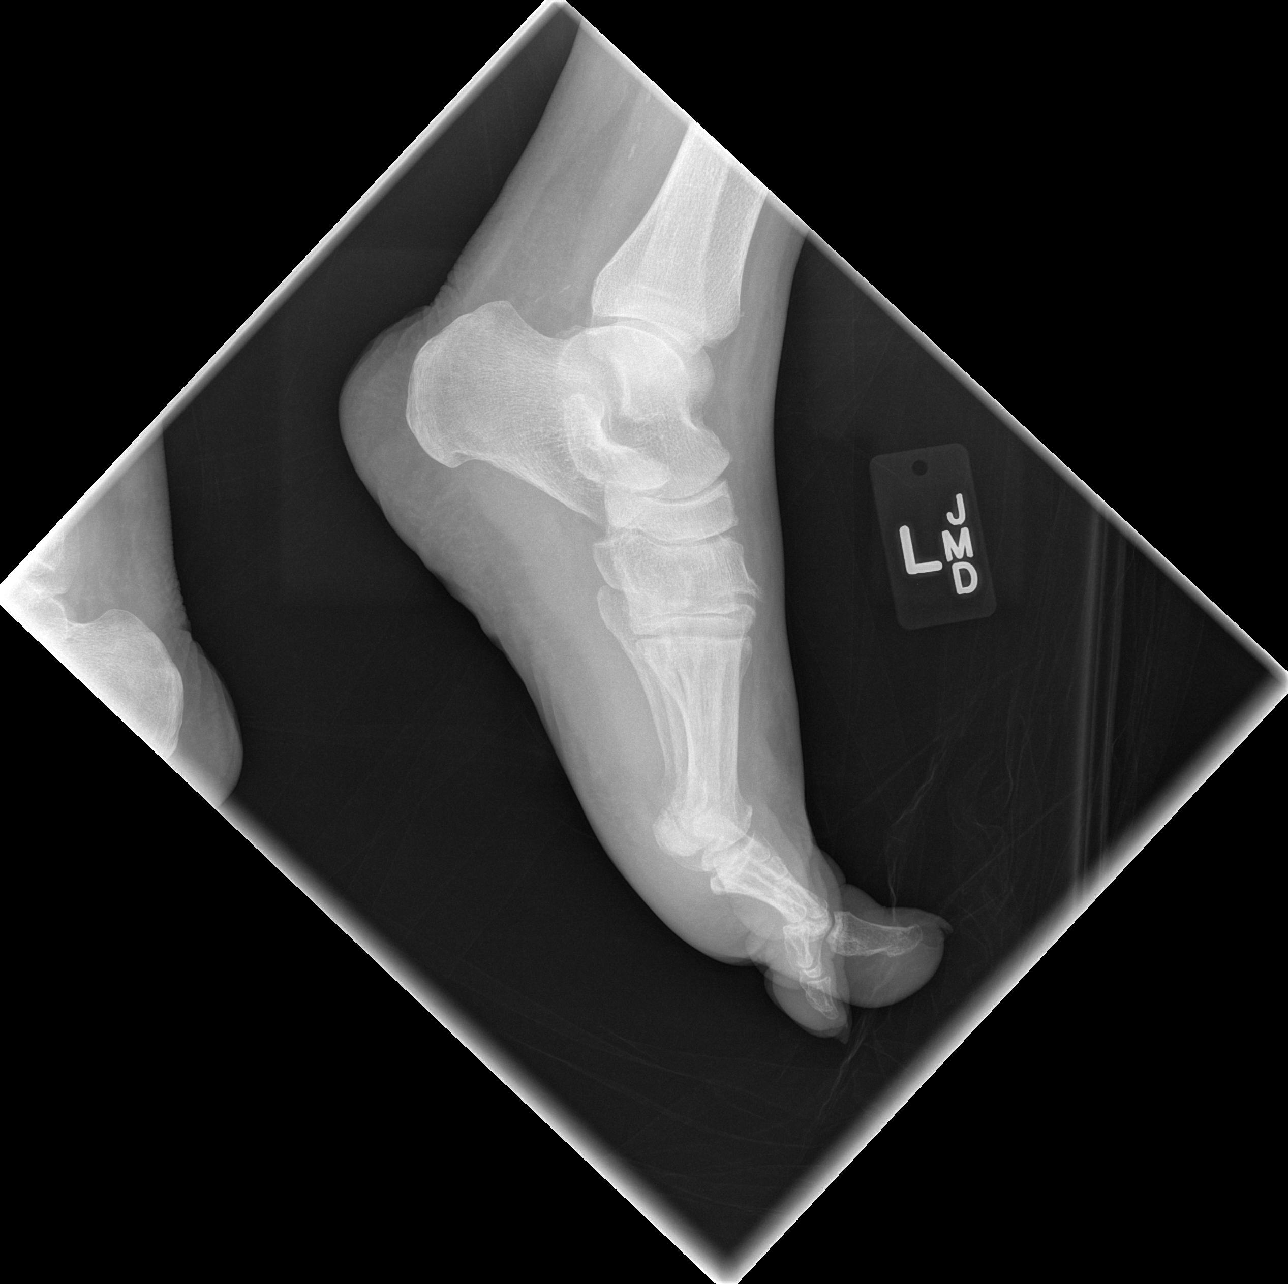

[t foot ap right]
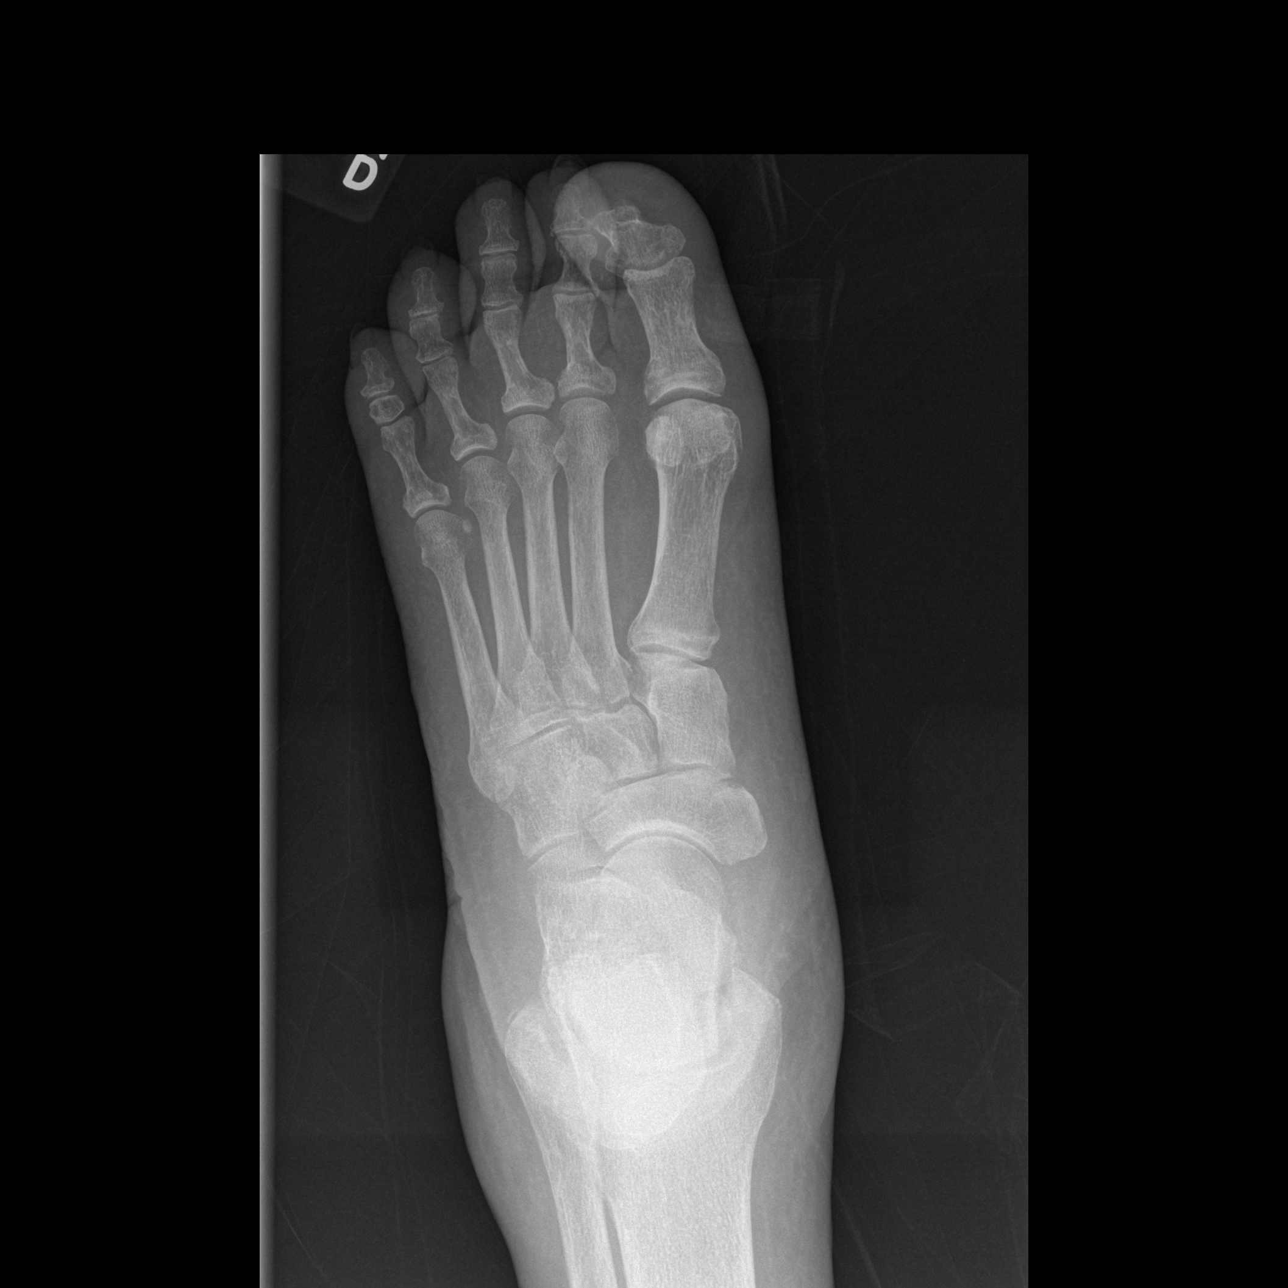

[t foot oblique right]
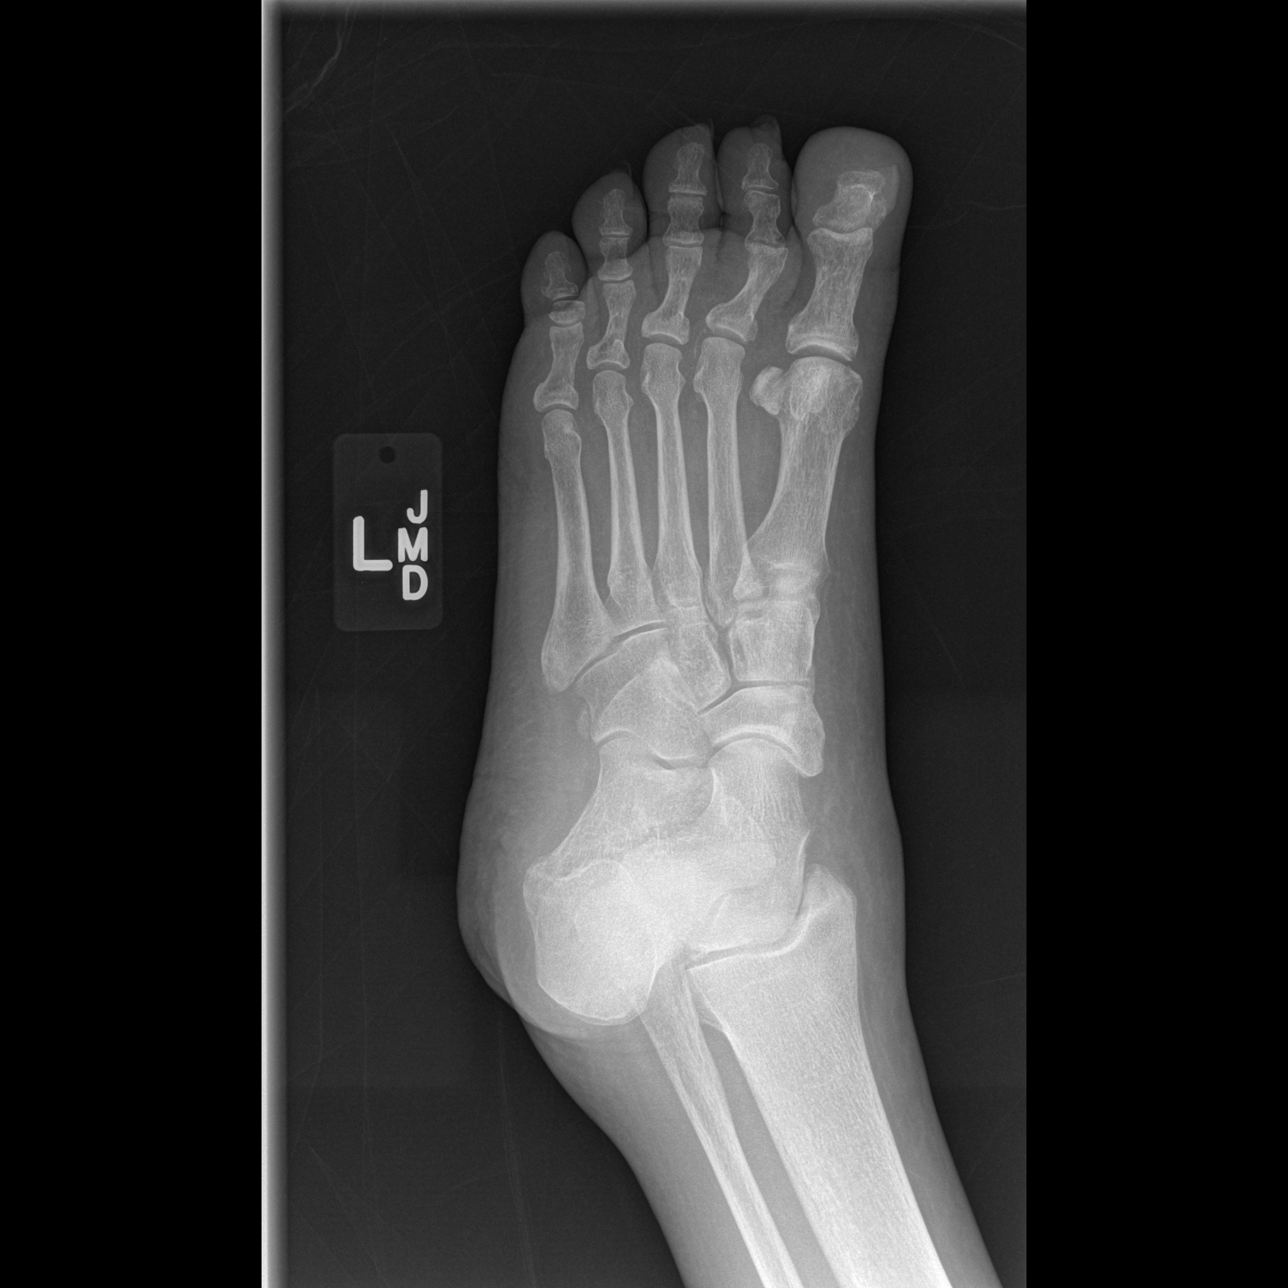

[3 of 3 positions shown; findings below may reference images not displayed]

FINDINGS: No evidence of fracture or dislocation.  Mild tarsal
degenerative spurring is seen in the midfoot.  No other significant
bone abnormality identified.
IMPRESSION: No acute findings.

## 2010-09-12 IMAGING — CR DG KNEE COMPLETE 4+V*L*
4 series · 4 of 4 positions shown · non-contrast
Comparison: None.

CLINICAL DATA: Knee pain.

LEFT KNEE - COMPLETE 4+ VIEW

[t knee ap right]
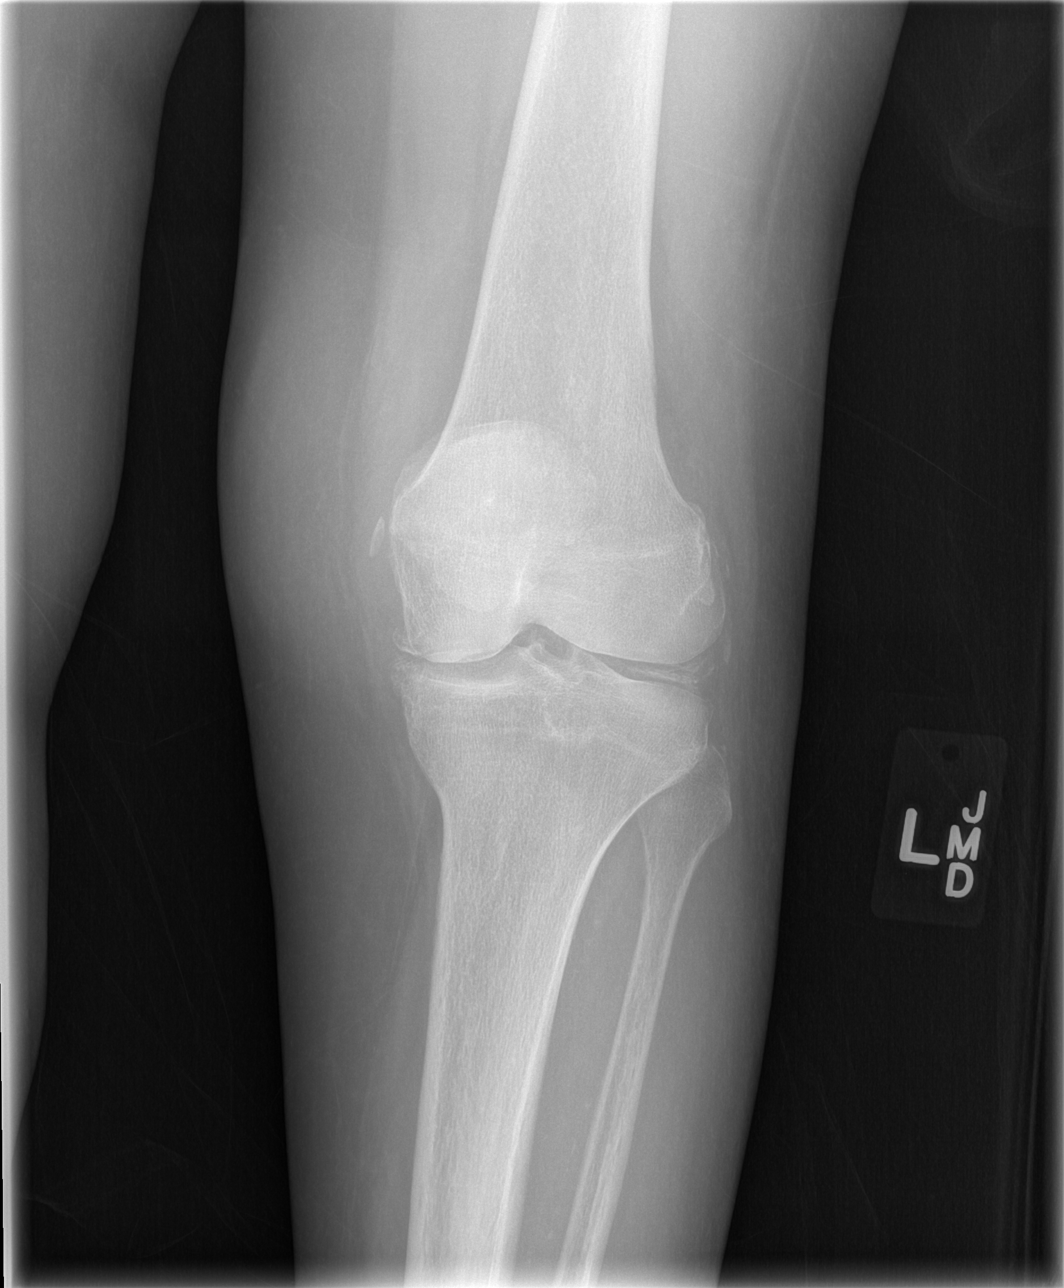

[t knee oblique right (1 of 2)]
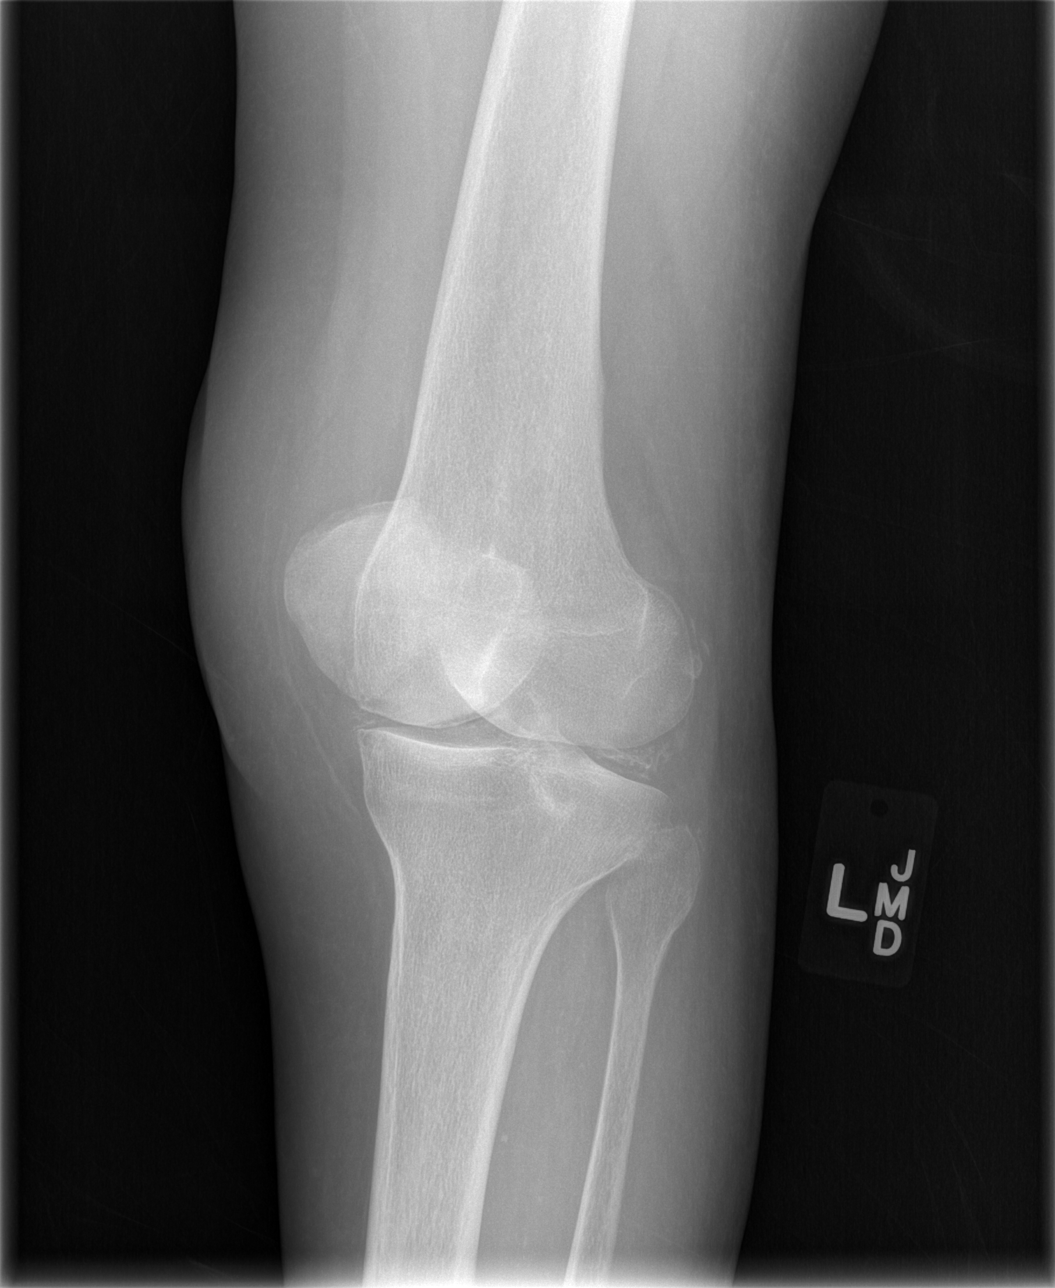

[t knee oblique right (2 of 2)]
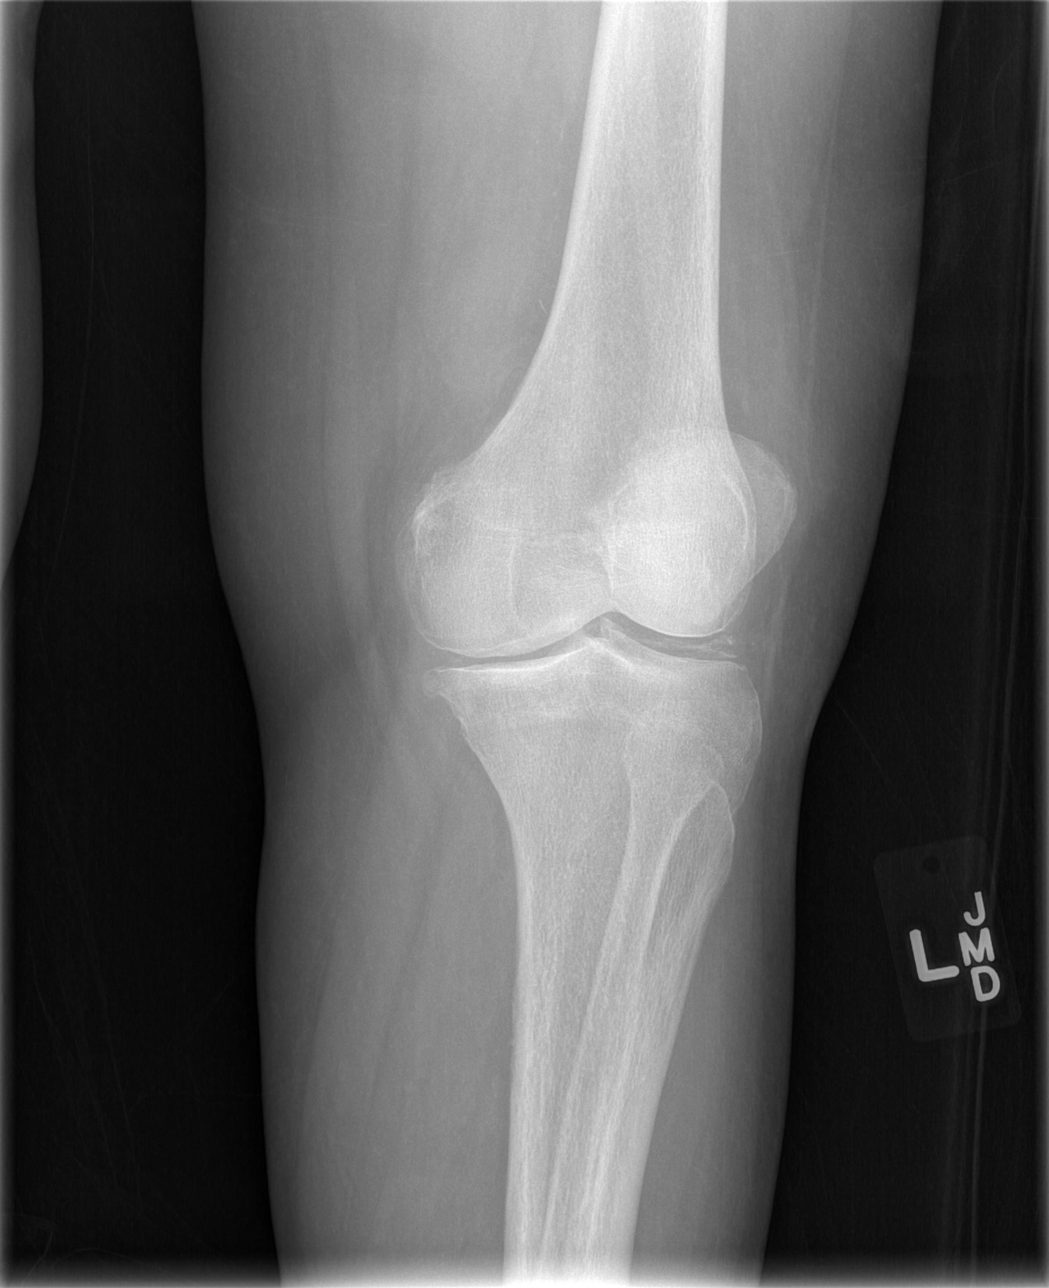

[w knee lat. right]
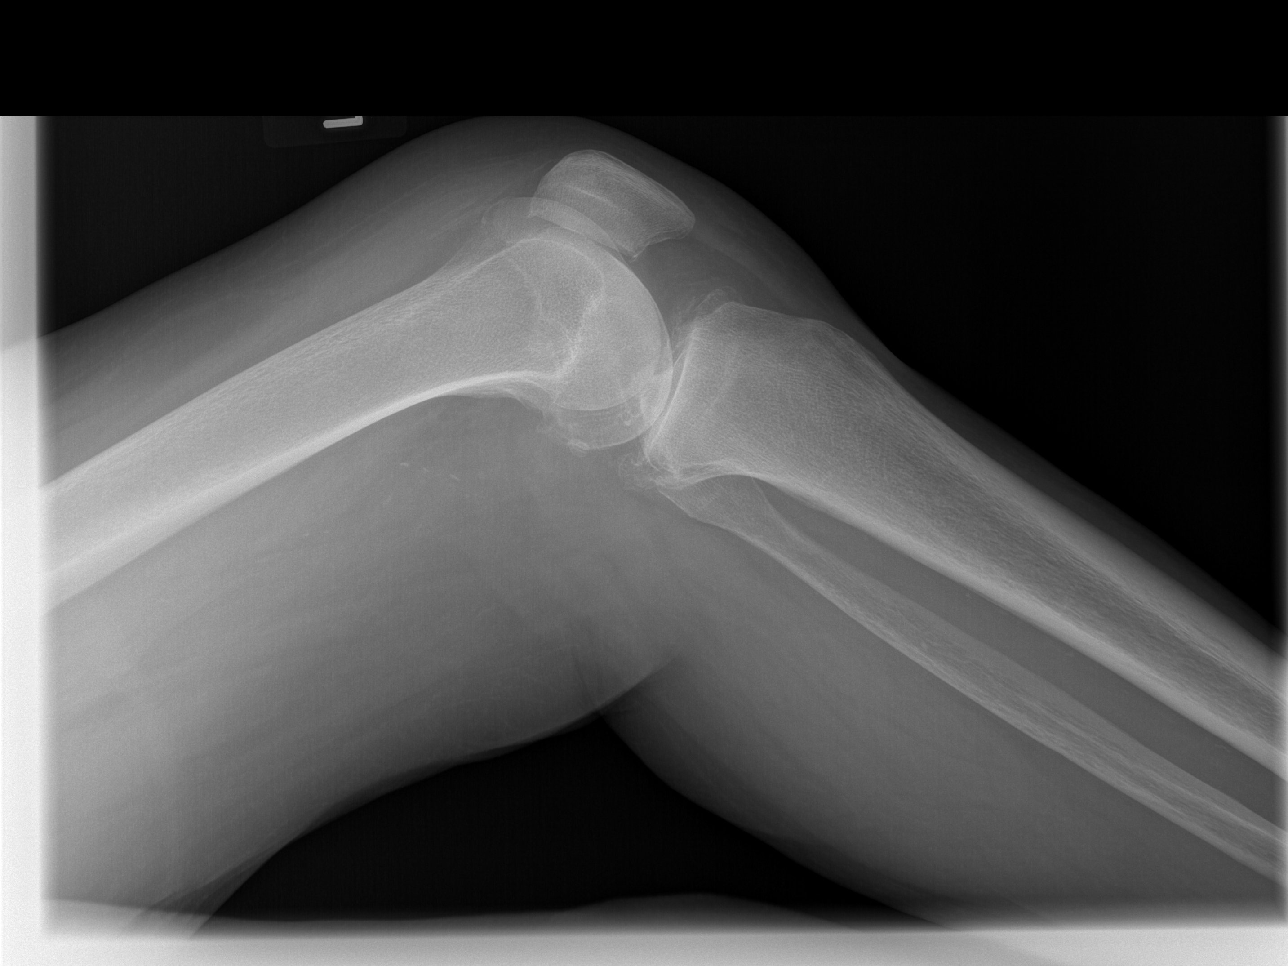

[4 of 4 positions shown; findings below may reference images not displayed]

FINDINGS: No evidence of fracture or dislocation.  Probable small
knee joint effusion.  Mild tricompartmental osteoarthritis seen as
well as chondrocalcinosis.
IMPRESSION: 1.  Mild tricompartmental osteoarthritis and chondrocalcinosis.
2.  Probable small knee joint effusion.
3.  No evidence of fracture.

## 2010-09-12 IMAGING — CR DG ANKLE COMPLETE 3+V*L*
3 series · 3 of 3 positions shown · non-contrast
Comparison: None.

CLINICAL DATA: Ankle pain.

LEFT ANKLE COMPLETE - 3+ VIEW

[t ankle joint ap right]
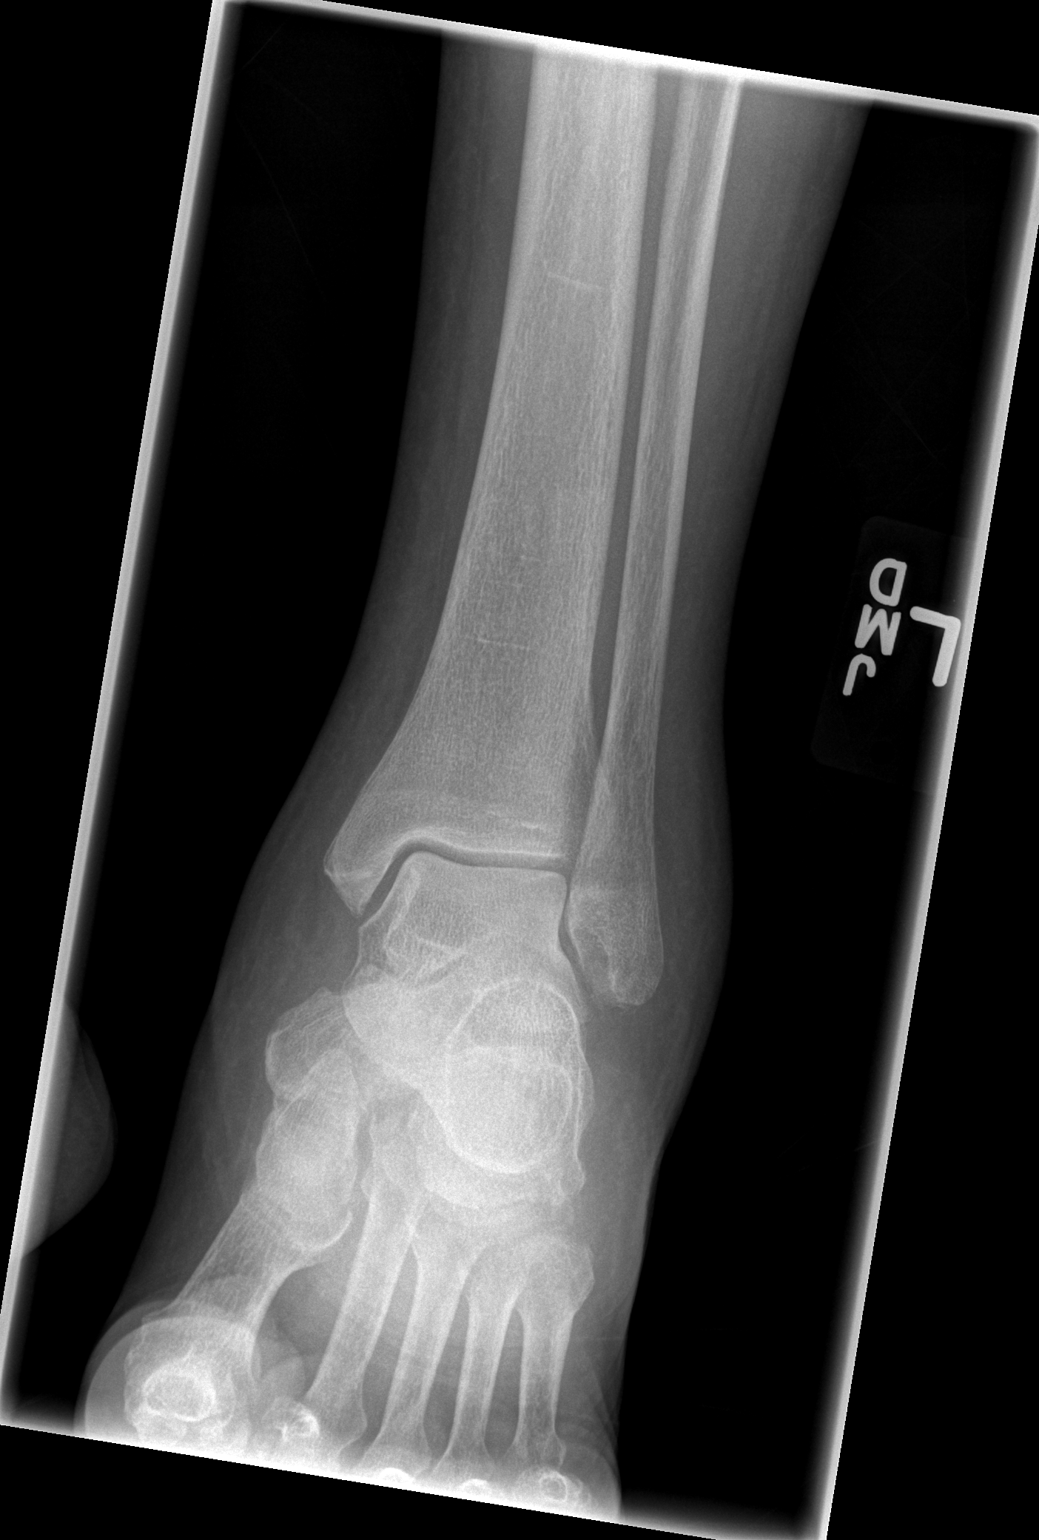

[t ankle joint oblique right]
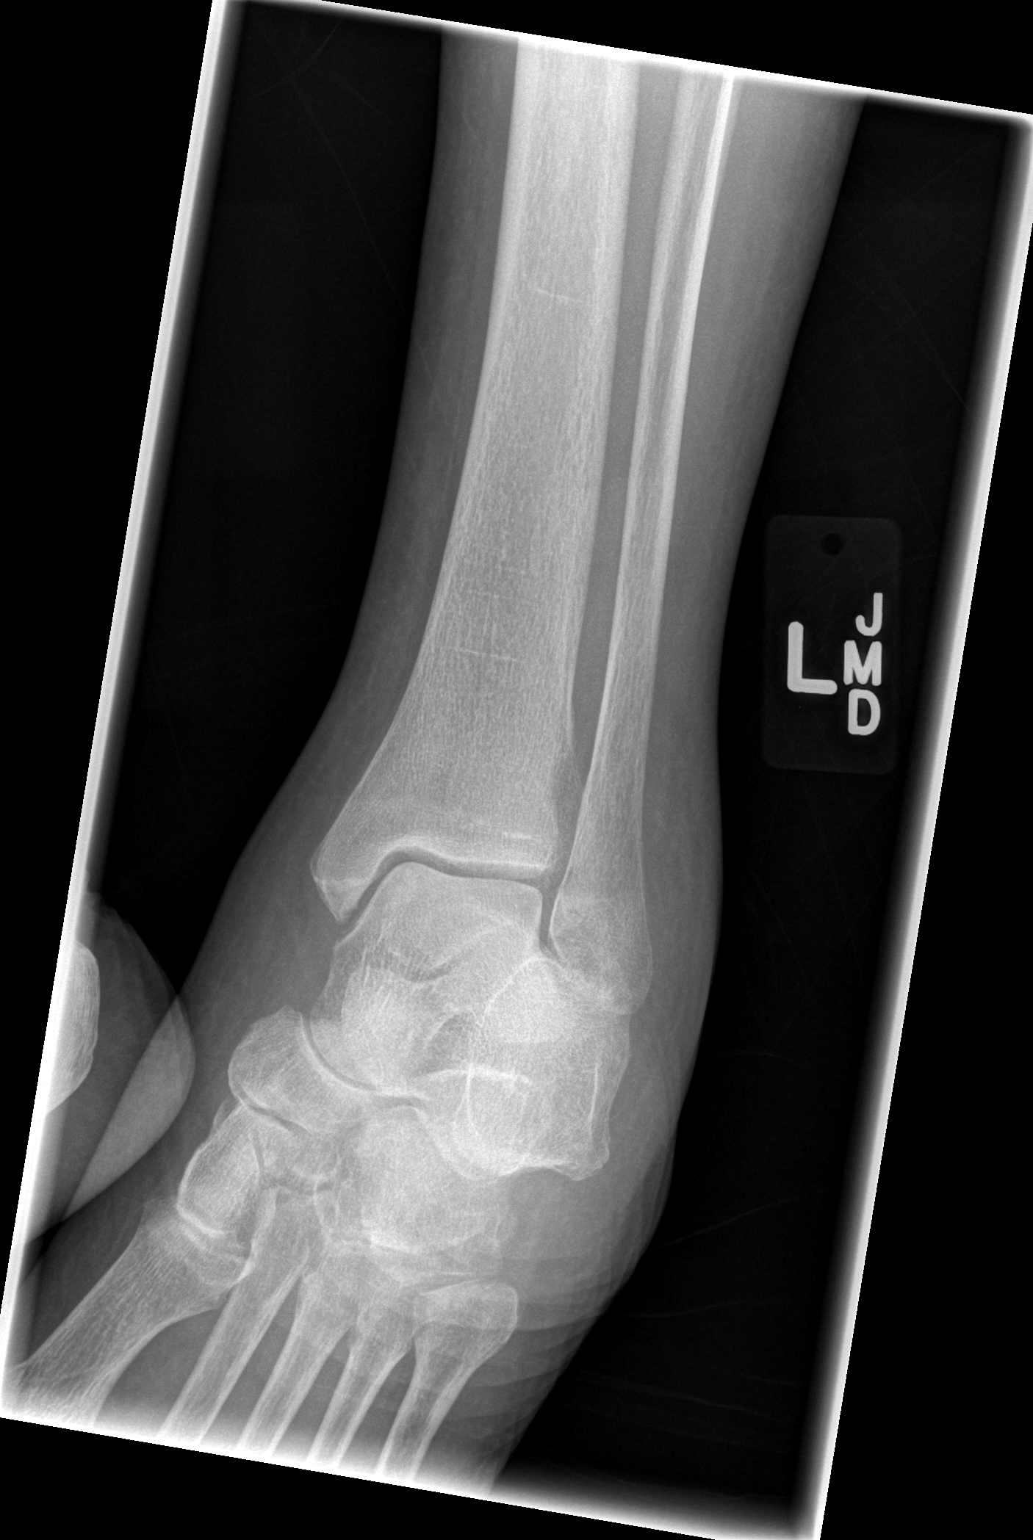

[t ankle joint lat right]
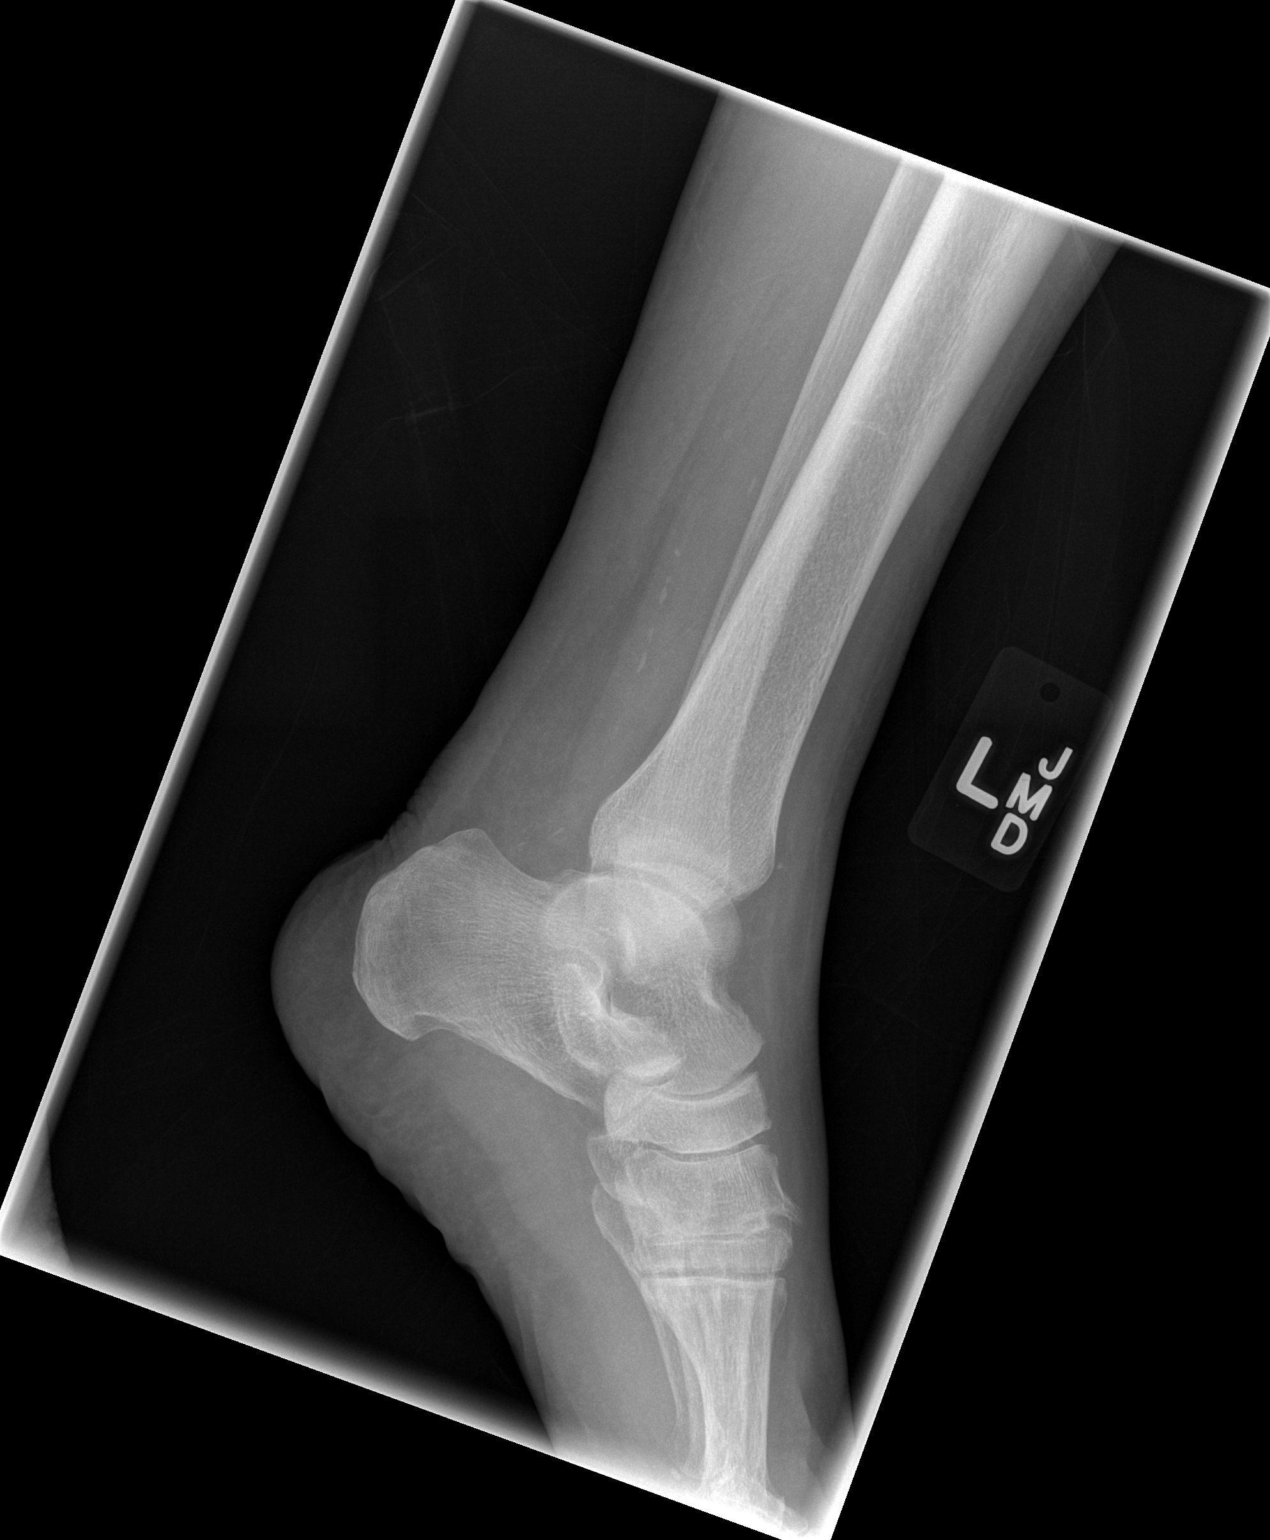

[3 of 3 positions shown; findings below may reference images not displayed]

FINDINGS: Diffuse soft tissue swelling noted.  No evidence of
fracture or dislocation.  No evidence of arthropathy or other bone
lesions.  Mild peripheral vascular calcification noted.
IMPRESSION: Diffuse soft tissue swelling.  No osseous abnormality.

## 2010-09-13 ENCOUNTER — Encounter: Admission: RE | Admit: 2010-09-13 | Discharge: 2010-09-13 | Payer: Self-pay | Admitting: Family Medicine

## 2010-09-15 ENCOUNTER — Telehealth: Payer: Self-pay | Admitting: Family Medicine

## 2010-09-18 ENCOUNTER — Encounter: Admission: RE | Admit: 2010-09-18 | Discharge: 2010-09-18 | Payer: Self-pay | Admitting: Family Medicine

## 2010-09-21 ENCOUNTER — Encounter: Payer: Self-pay | Admitting: Family Medicine

## 2010-09-28 ENCOUNTER — Encounter: Admission: RE | Admit: 2010-09-28 | Discharge: 2010-09-28 | Payer: Self-pay | Admitting: Family Medicine

## 2010-09-28 ENCOUNTER — Ambulatory Visit: Payer: Self-pay | Admitting: Family Medicine

## 2010-09-29 ENCOUNTER — Encounter: Payer: Self-pay | Admitting: Family Medicine

## 2010-09-29 ENCOUNTER — Ambulatory Visit: Payer: Self-pay | Admitting: Family Medicine

## 2010-10-08 IMAGING — CR DG CHEST 2V
2 series · 2 of 2 positions shown · non-contrast
Comparison: 12/23/2009 and earlier.

CLINICAL DATA: 86-year-old female syncope, weakness.

CHEST - 2 VIEW

[w chest lat]
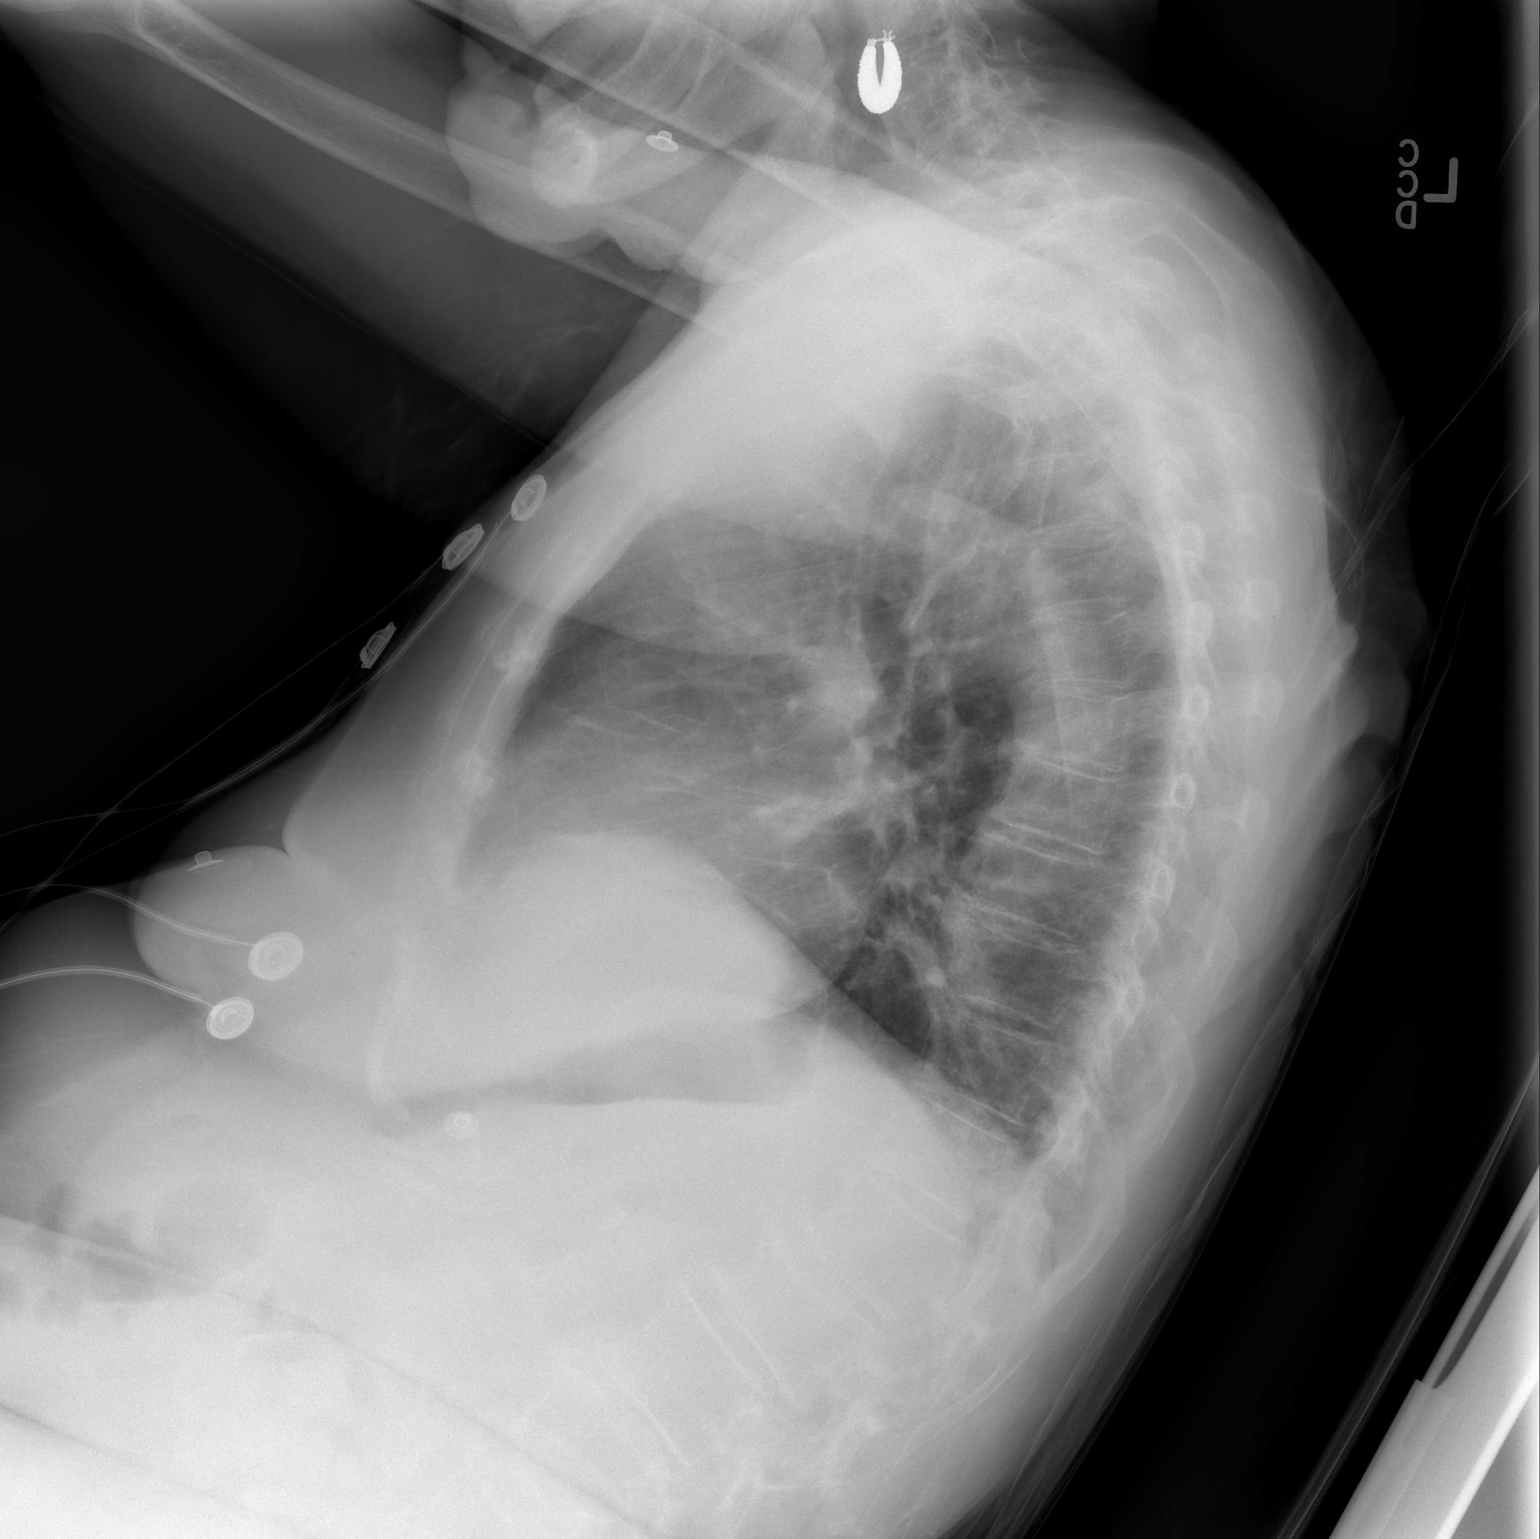

[view not recorded]
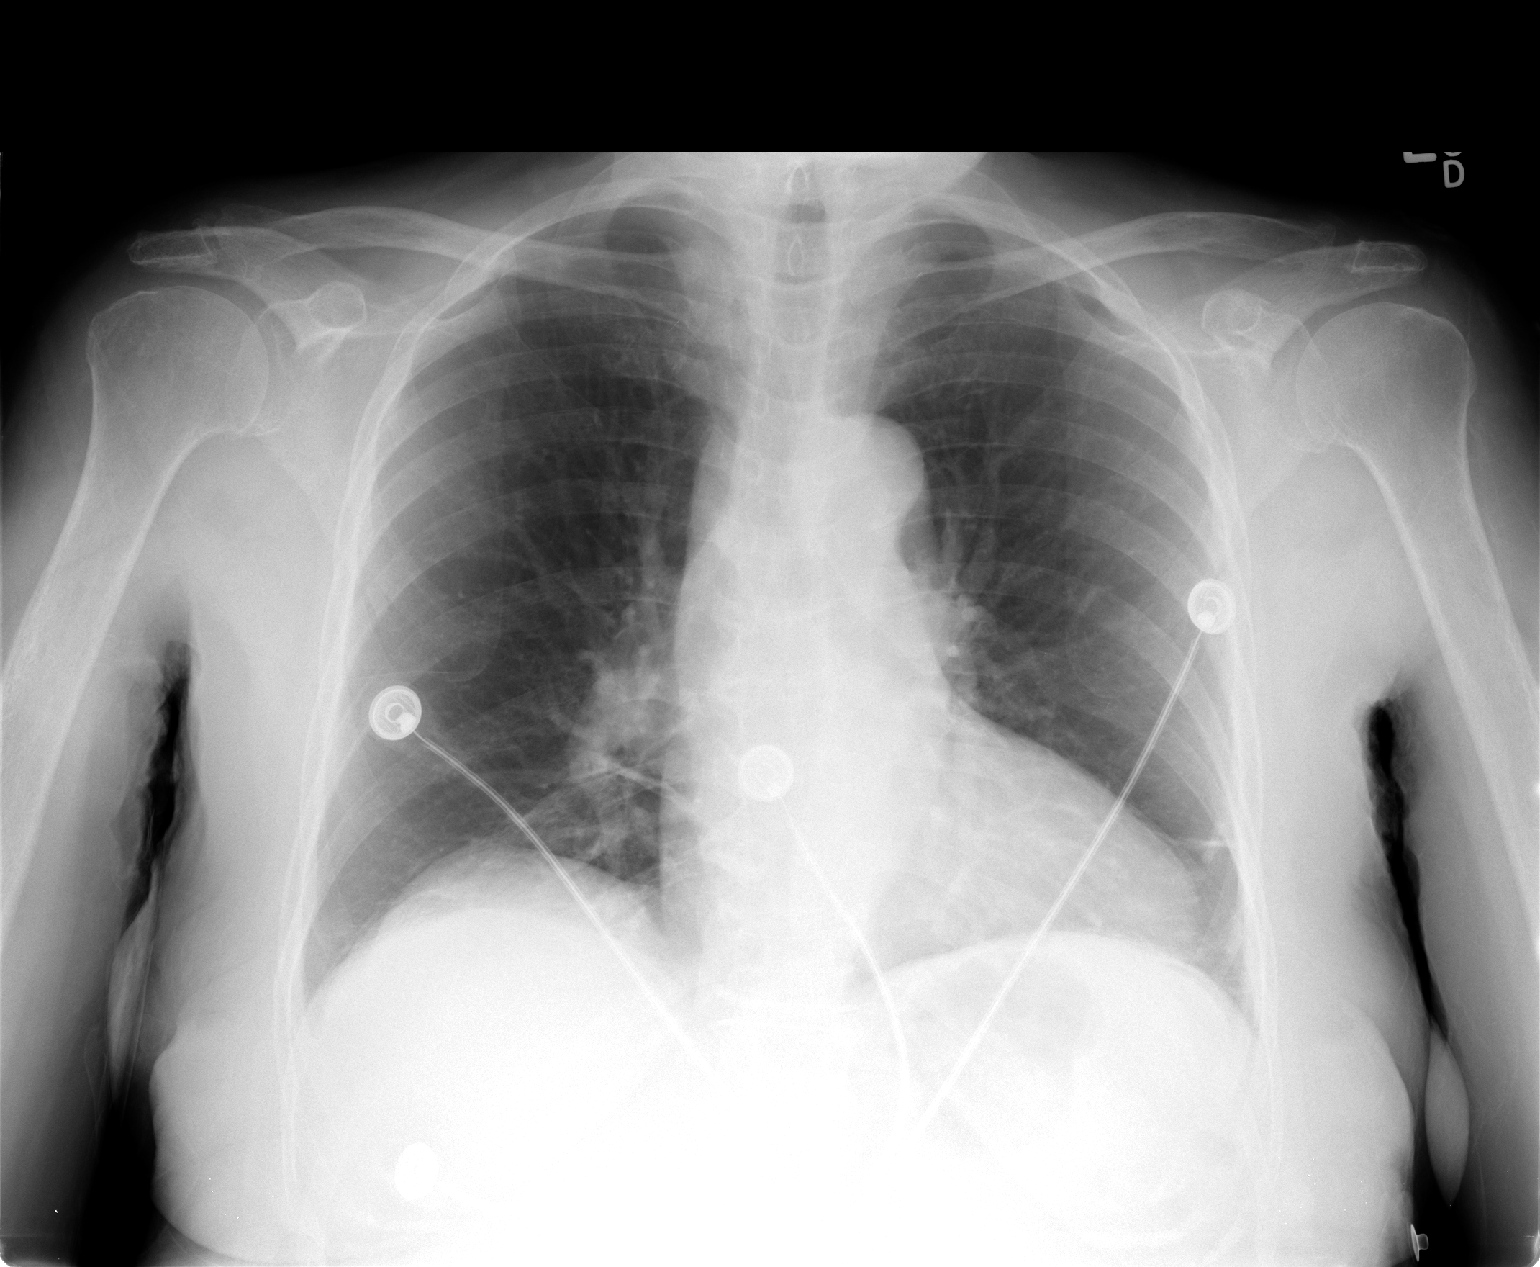

[2 of 2 positions shown; findings below may reference images not displayed]

FINDINGS: Upright AP and lateral views of the chest.  Somewhat low
lung volumes.  Cardiac size and mediastinal contours are within
normal limits.  Visualized tracheal air column is within normal
limits.  Mild linear opacity at the left lung base is chronic to a
degree.  Otherwise, the lungs are clear.  No pneumothorax or
effusion.  Stable spondylolisthesis in the spine at the
thoracolumbar junction. No acute osseous abnormality identified.
IMPRESSION: Minor scarring or atelectasis at the left base. No acute
cardiopulmonary abnormality.

## 2010-10-09 IMAGING — CR DG CERVICAL SPINE COMPLETE 4+V
7 series · 7 of 7 positions shown · non-contrast
Comparison: MRI cervical spine 03/04/2004.

CLINICAL DATA: Fall, injury, pain.

CERVICAL SPINE - COMPLETE 4+ VIEW

[w c-spine lat *]
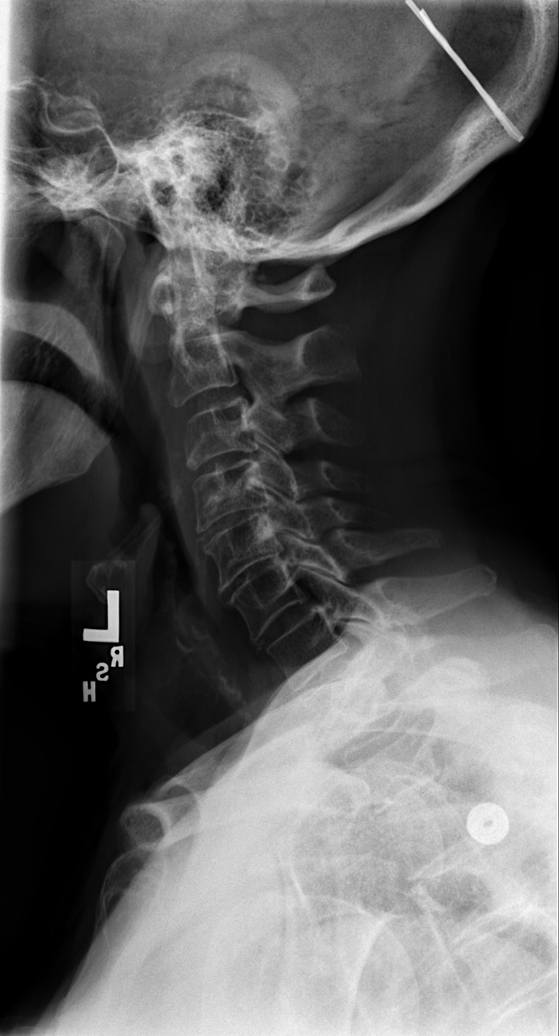

[w c-spine oblique * (1 of 2)]
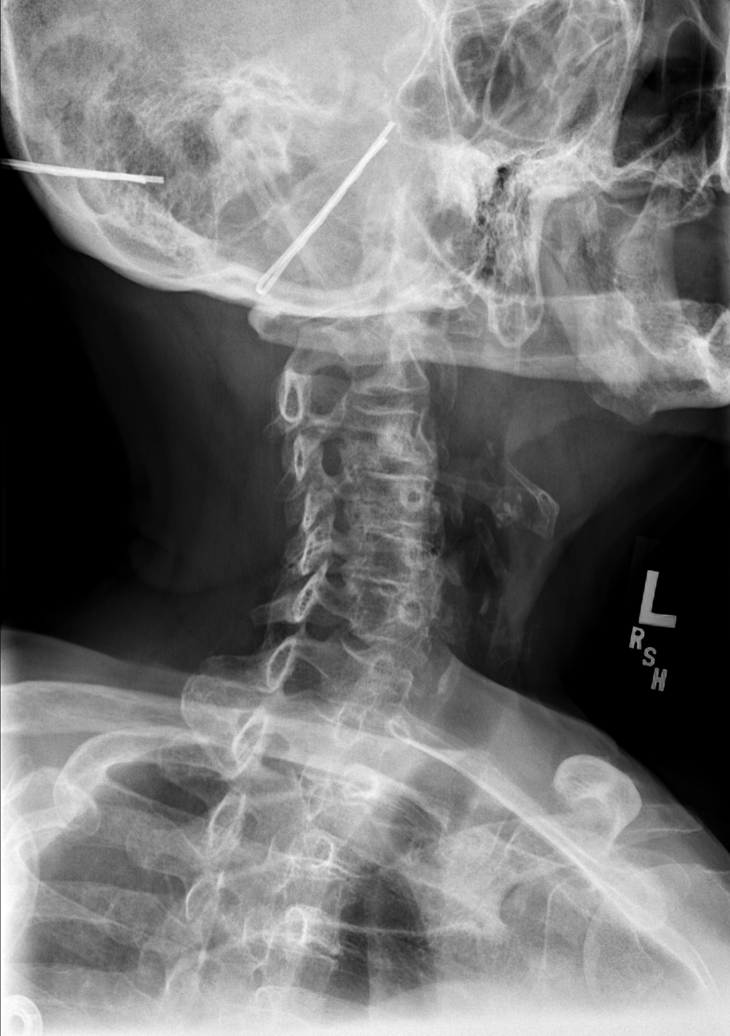

[w c-spine oblique * (2 of 2)]
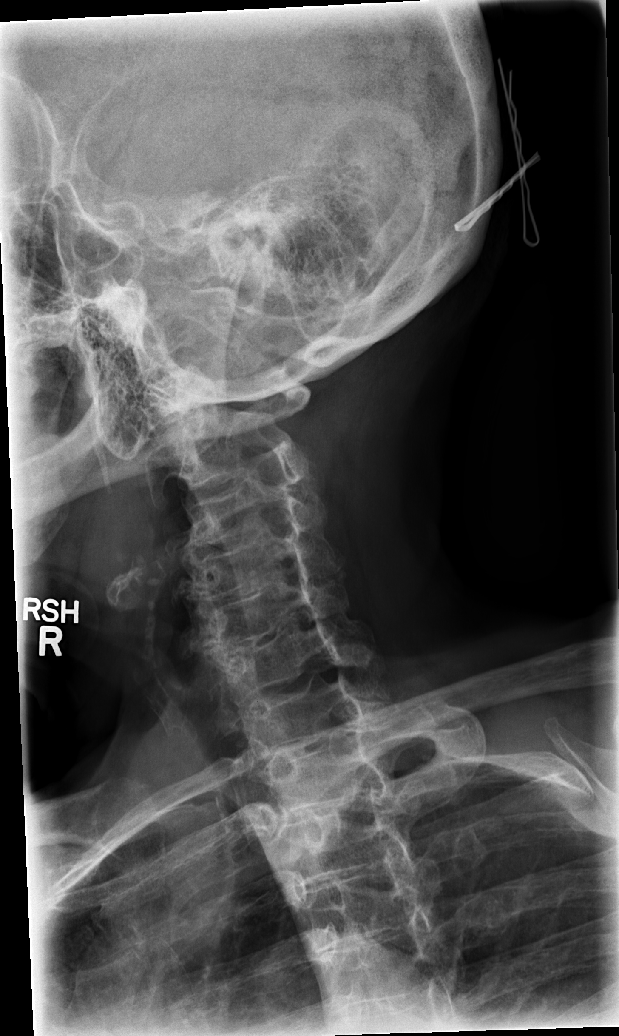

[w c-spine a.p.]
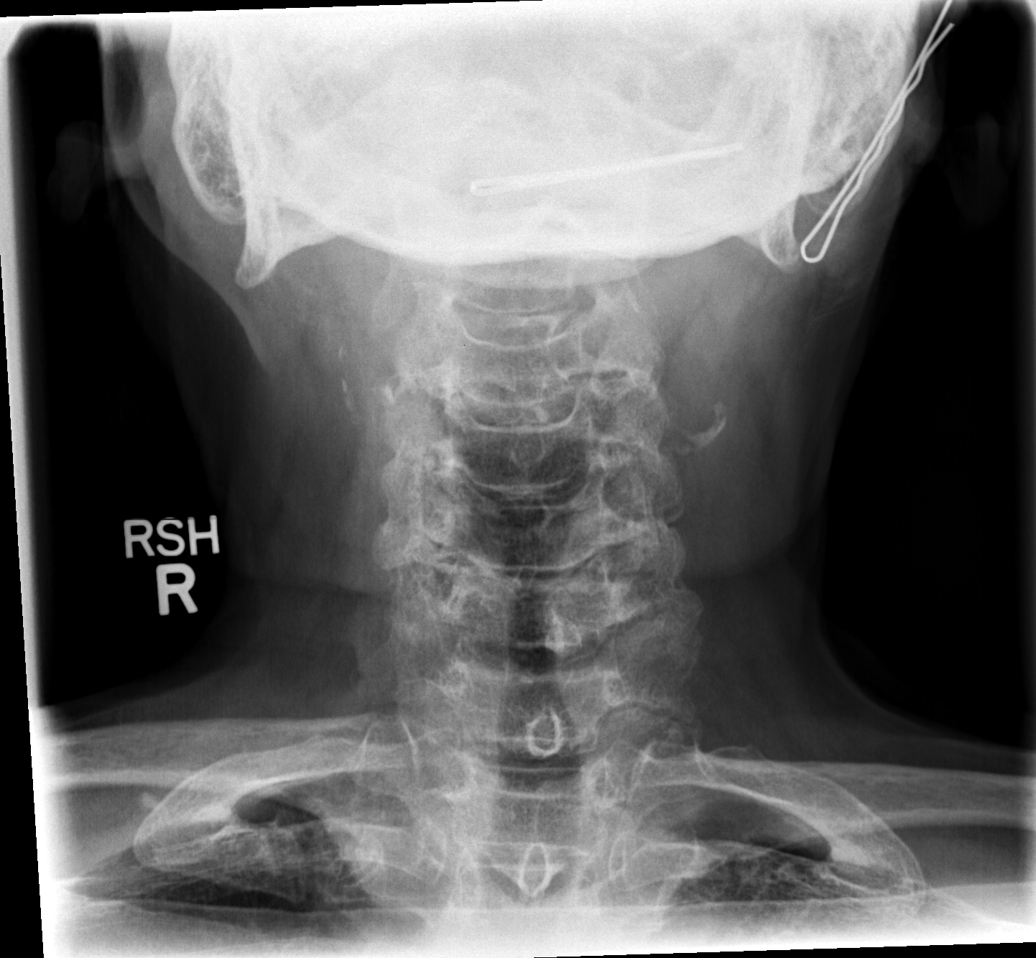

[w c-spine odontoid (1 of 2)]
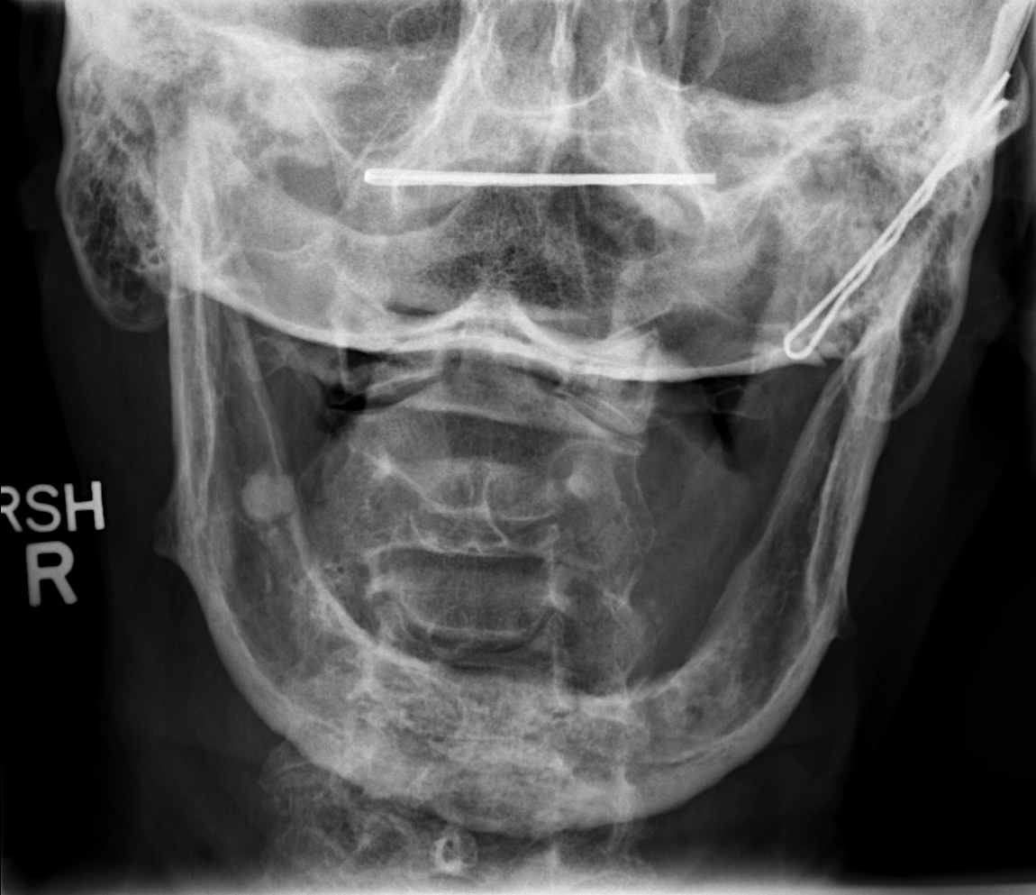

[w c-spine odontoid (2 of 2)]
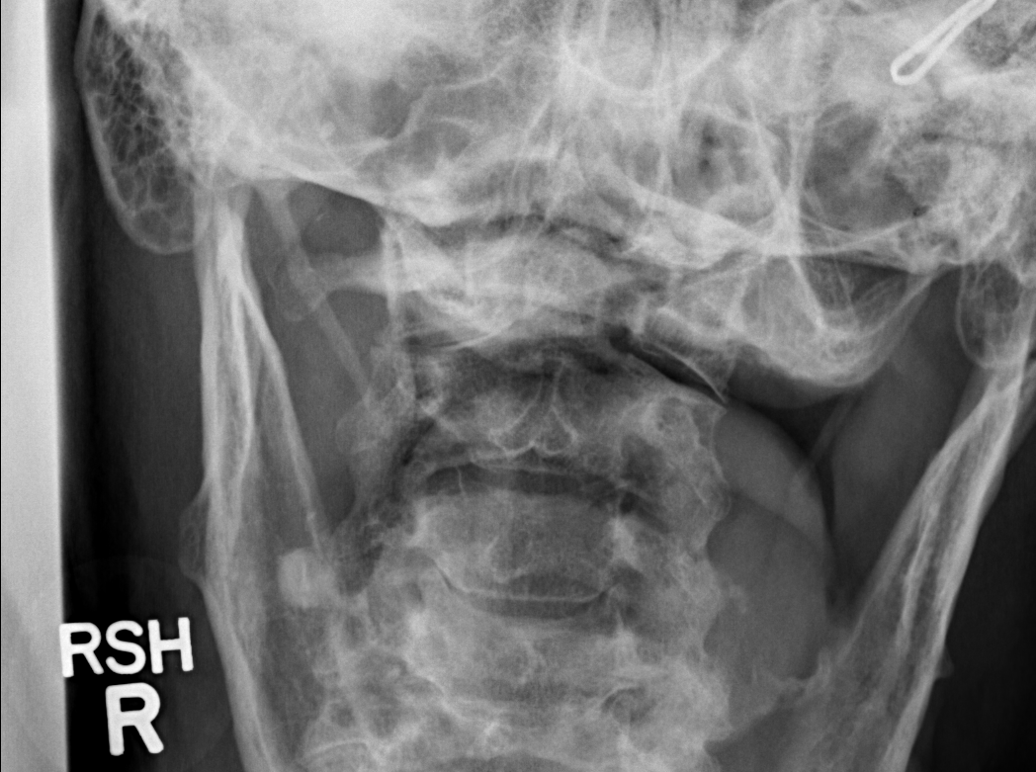

[w swimmers view]
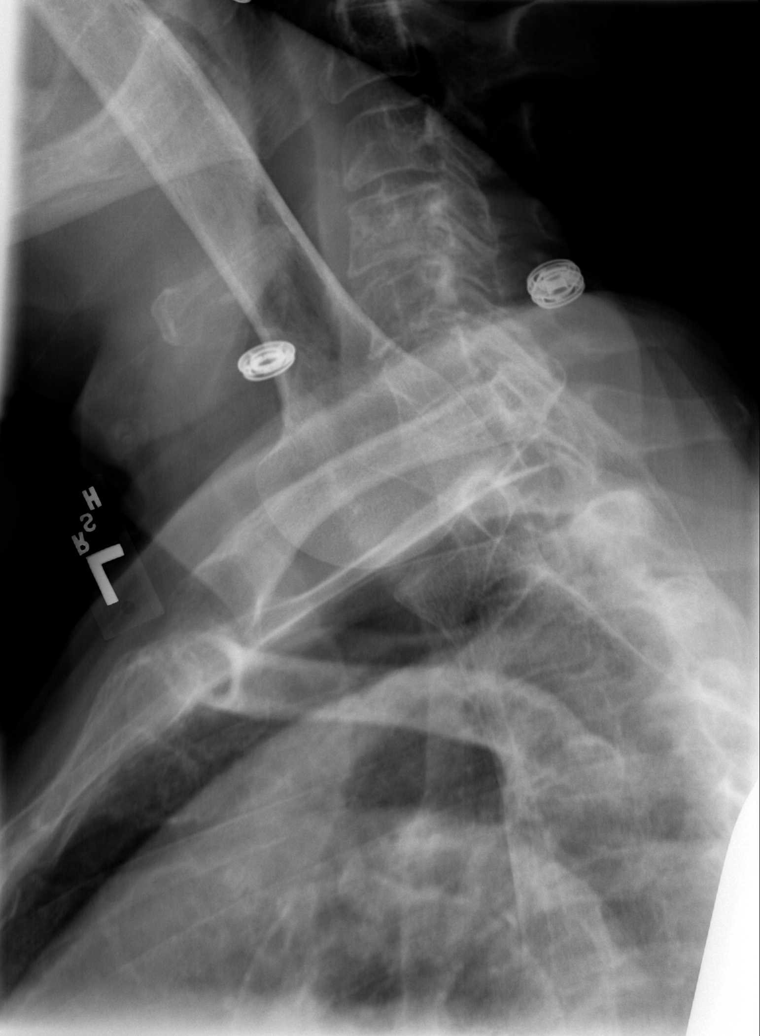

[7 of 7 positions shown; findings below may reference images not displayed]

FINDINGS: Vertebral body height is maintained.  Mild
anterolisthesis of C6 on C7 is unchanged.  Loss of disc space
height is most notable at the C4-5 and C5-6 levels.  Prevertebral
soft tissues appear normal.  Carotid atherosclerosis noted.
IMPRESSION: 1.  No acute finding.
2.  Spondylosis most notable C4-5 and C5-6.
3.  Atherosclerotic vascular disease.

## 2010-10-13 ENCOUNTER — Emergency Department (HOSPITAL_COMMUNITY)
Admission: EM | Admit: 2010-10-13 | Discharge: 2010-10-13 | Payer: Self-pay | Source: Home / Self Care | Admitting: Emergency Medicine

## 2010-10-13 ENCOUNTER — Telehealth: Payer: Self-pay | Admitting: Family Medicine

## 2010-10-13 HISTORY — PX: OTHER SURGICAL HISTORY: SHX169

## 2010-10-14 ENCOUNTER — Telehealth: Payer: Self-pay | Admitting: Family Medicine

## 2010-10-20 ENCOUNTER — Ambulatory Visit: Payer: Self-pay | Admitting: Hematology and Oncology

## 2010-10-23 ENCOUNTER — Emergency Department (HOSPITAL_COMMUNITY)
Admission: EM | Admit: 2010-10-23 | Discharge: 2010-10-23 | Payer: Self-pay | Source: Home / Self Care | Admitting: Emergency Medicine

## 2010-10-24 ENCOUNTER — Encounter (INDEPENDENT_AMBULATORY_CARE_PROVIDER_SITE_OTHER): Payer: Self-pay | Admitting: Surgery

## 2010-10-24 ENCOUNTER — Ambulatory Visit (HOSPITAL_COMMUNITY)
Admission: RE | Admit: 2010-10-24 | Discharge: 2010-10-25 | Payer: Self-pay | Source: Home / Self Care | Attending: Surgery | Admitting: Surgery

## 2010-11-03 ENCOUNTER — Encounter: Payer: Self-pay | Admitting: Family Medicine

## 2010-11-09 IMAGING — MR MR BREAST BILAT WO/W CM
16 of 23 series · 31 of 48 positions shown · IV contrast (10cc multihance)
Comparison: Mammogram and ultrasound from the [REDACTED]
[HOSPITAL] 09/02/2010, 09/13/2010, and earlier

CLINICAL DATA: The patient has recently been diagnosed with
bilateral breast cancer.  Invasive ductal carcinoma was diagnosed
in the left breast, 12 o'clock location 3 cm from the nipple.
Invasive ductal  carcinoma with extracellular mucin and adjacent
ductal carcinoma in situ in the right breast, 10 o'clock location,
3 cm from the nipple.  The patient has a family history of breast
cancer in a daughter at age 60 and a daughter at age 30.

BILATERAL BREAST MRI WITH AND WITHOUT CONTRAST
TECHNIQUE: Multiplanar, multisequence MR images of both breasts
were obtained prior to and following the intravenous administration
of 10ml of Multihance.  Three dimensional images were evaluated at
the independent DynaCad workstation.

[Series 2: T2 · axial · 3.0mm · 0.94mm/px · 1 of 55 slices shown]
[im 1/55]
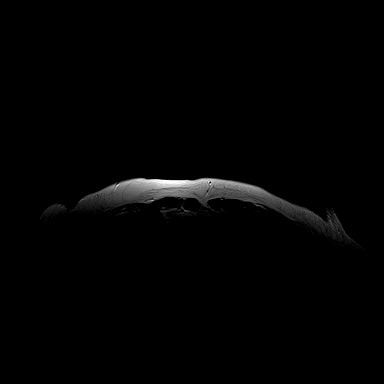

[Series 3: t2_tirm_tra ipat (a-p) · axial · 3.0mm · 0.70mm/px · 1 of 55 slices shown]
[im 1/55]
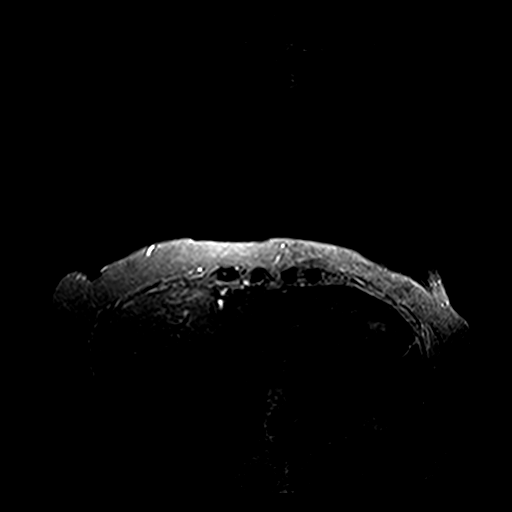

[Series 4: fl3d pre-cm no · axial · non-contrast · 1.2mm · 0.94mm/px · z∈[-98,+74]mm · 2 of 144 slices shown]
[im 1/144]
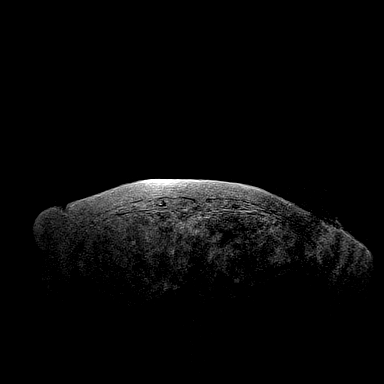
[im 144/144]
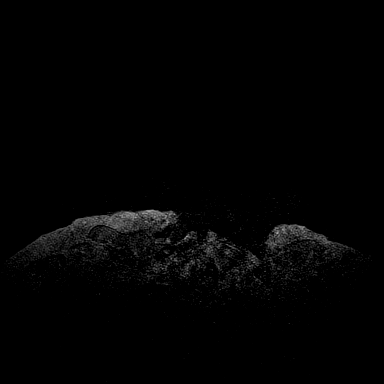

[Series 10: fl3d pre-cm repeat · axial · non-contrast · 1.2mm · 0.94mm/px · z∈[-88,+64]mm · 2 of 128 slices shown]
[im 1/128]
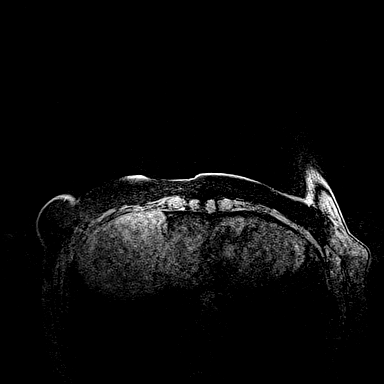
[im 128/128]
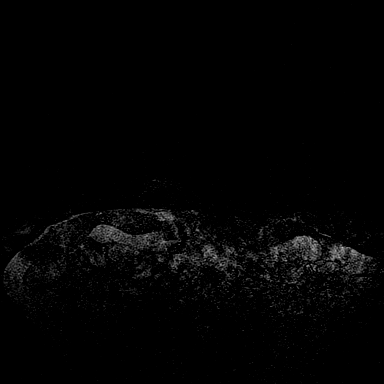

[Series 11: fl3d post-cm 20 · axial · 1.2mm · 0.94mm/px · z∈[-88,+64]mm · 2 of 128 slices shown (1 of 5)]
[im 1/128]
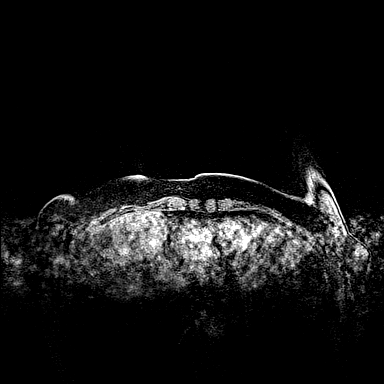
[im 128/128]
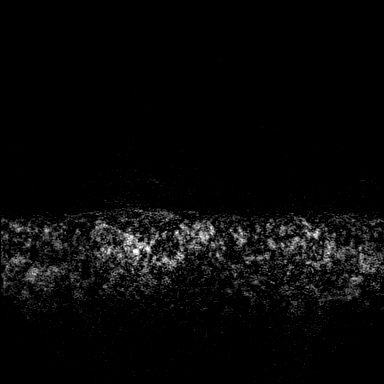

[Series 12: fl3d post-cm 20 · axial · 1.2mm · 0.94mm/px · z∈[-88,+64]mm · 2 of 128 slices shown (2 of 5)]
[im 1/128]
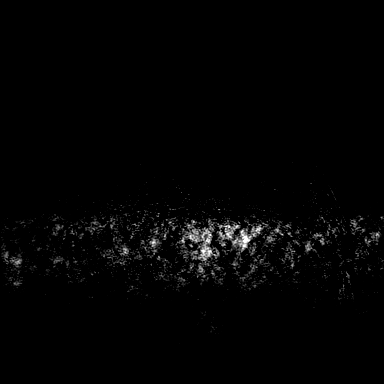
[im 128/128]
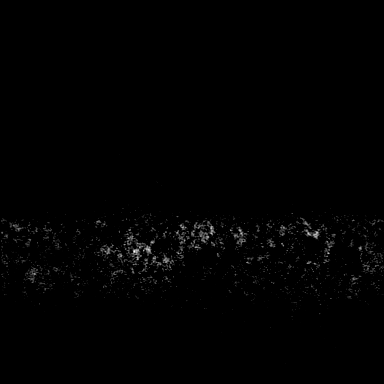

[Series 13: fl3d post-cm 20 · axial · 153.6mm · 0.94mm/px · 1 of 1 slices shown (3 of 5)]
[im 1/1]
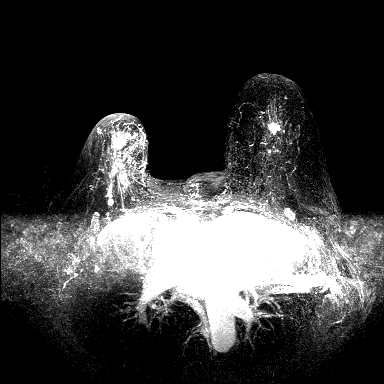

[Series 14: fl3d post-cm 3min · axial · 1.2mm · 0.94mm/px · z∈[-88,+64]mm · 2 of 128 slices shown]
[im 1/128]
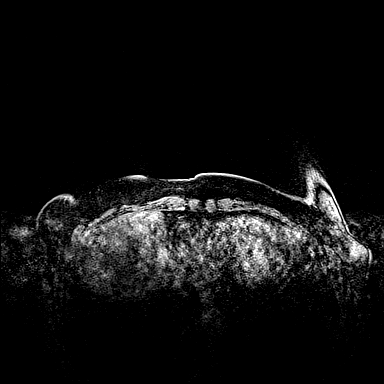
[im 128/128]
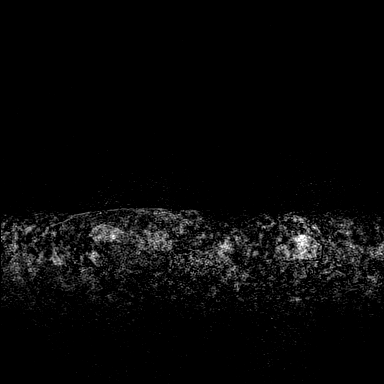

[Series 15: fl3d post-cm 3min_sub · axial · 1.2mm · 0.94mm/px · z∈[-88,+64]mm · 3 of 128 slices shown]
[im 1/128]
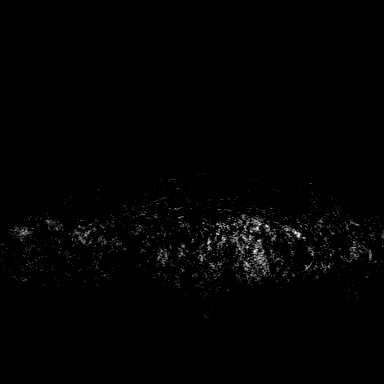
[im 64/128]
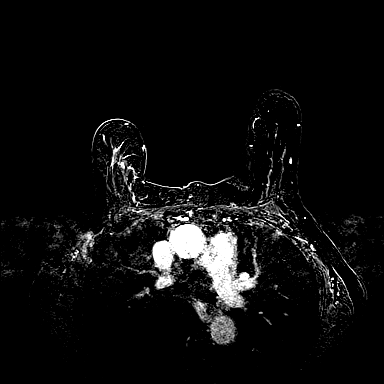
[im 128/128]
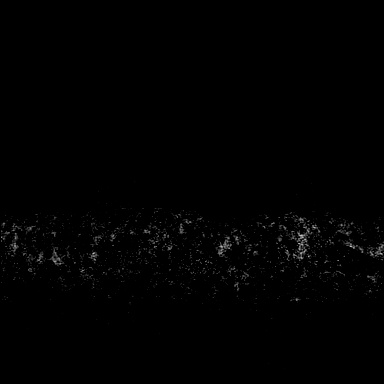

[Series 16: fl3d post-cm 3min_sub_mip_tra · axial · 153.6mm · 0.94mm/px · 1 of 1 slices shown]
[im 1/1]
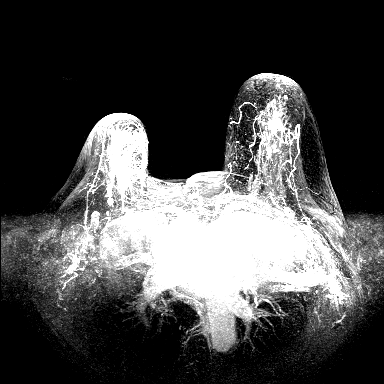

[Series 17: fl3d post-cm 5min · axial · 1.2mm · 0.94mm/px · z∈[-88,+64]mm · 3 of 128 slices shown]
[im 1/128]
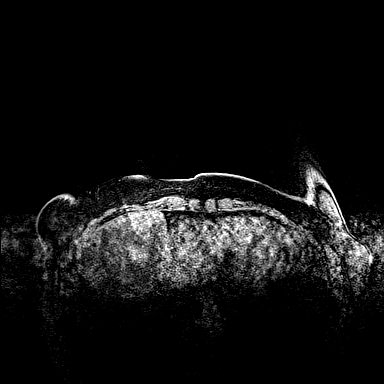
[im 64/128]
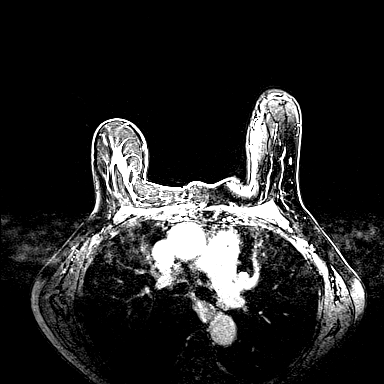
[im 128/128]
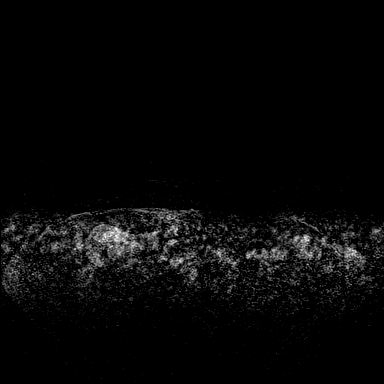

[Series 18: fl3d post-cm 5min_sub · axial · 1.2mm · 0.94mm/px · z∈[-88,+64]mm · 3 of 128 slices shown]
[im 1/128]
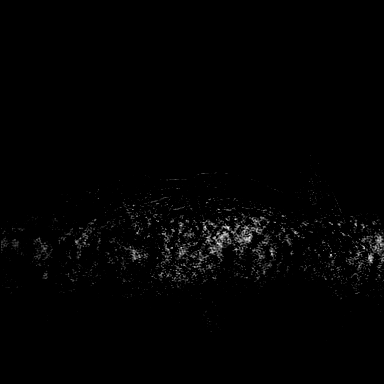
[im 64/128]
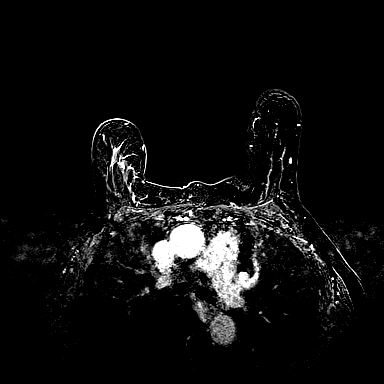
[im 128/128]
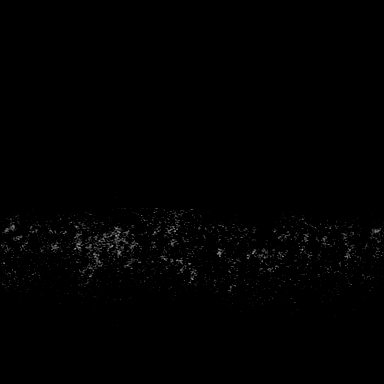

[Series 19: fl3d post-cm 5min_sub_mip_tra · axial · 153.6mm · 0.94mm/px · 1 of 1 slices shown]
[im 1/1]
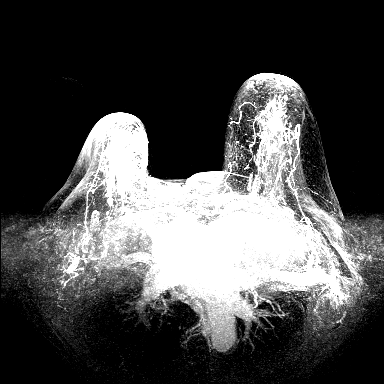

[Series 23: fl3d pre-cm · axial · non-contrast · 1.2mm · 0.78mm/px · z∈[-84,+68]mm · 3 of 128 slices shown]
[im 1/128]
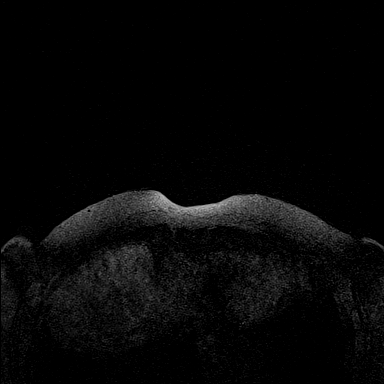
[im 64/128]
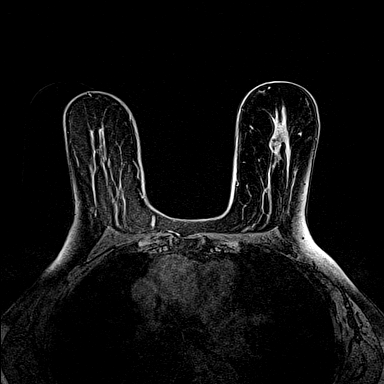
[im 128/128]
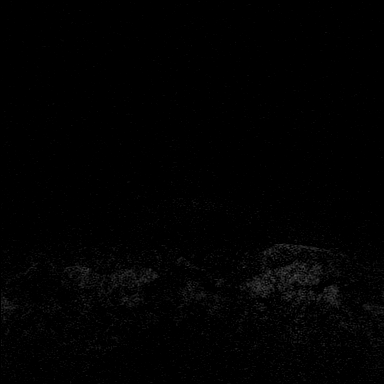

[Series 24: fl3d post-cm 20 · axial · 1.2mm · 0.78mm/px · z∈[-84,+68]mm · 3 of 128 slices shown (4 of 5)]
[im 1/128]
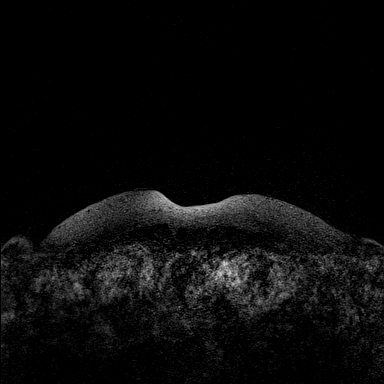
[im 64/128]
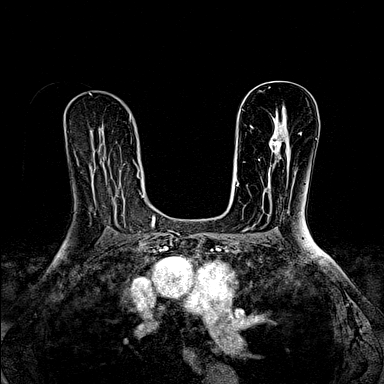
[im 128/128]
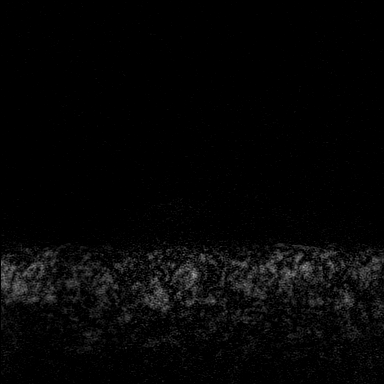

[Series 25: fl3d post-cm 20 · axial · 1.2mm · 0.78mm/px · 1 of 128 slices shown (5 of 5)]
[im 1/128]
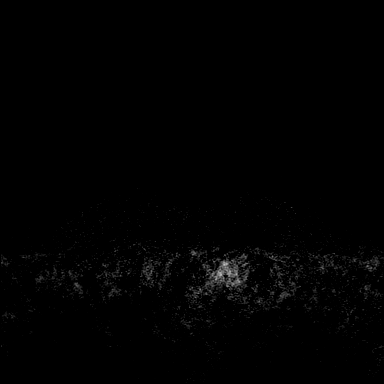

[31 of 48 positions shown; findings below may reference images not displayed]

FINDINGS: The within the lateral portion of the right breast, there
is a rim enhancing mass which shows persistent type enhancement
kinetics.  Mass is round and has ill-defined margins.  There is
associated tissue marker clip artifact following recent ultrasound
guided core biopsy.  Mass measures 9 x 13 x 11 mm.  Just inferior,
posterior, and lateral to this lesion, there is a small nodule also
showing persistent type enhancement kinetics measuring 6 x 4 x 8
mm.  Measuring from the superior aspect of the larger lesion to the
inferior aspect of the smaller lesion is 2.7 cm.  There is no
mammographic correlation for the second, more inferior nodule.

On the left, in the 12 o'clock location, there is an oval mass with
spiculated margins measuring 1.1 x 1.2 x 1.3 cm.  This mass shows
washout type enhancement kinetics and is consistent with known
malignancy.  There is associated tissue marker clip artifact.

No suspicious internal mammary or axillary lymph nodes are
identified.  Note is made of probable thyroid cyst.
IMPRESSION: 1.  On the right, there are two nodules of concern, the more
superior which has already been biopsy.  I would recommend targeted
ultrasound for the second, more inferior nodule.  If there is no
sonographic correlate for this nodule, MR guided core biopsy would
be recommended if breast conservation is being considered.
2.  Solitary mass within the 12 o'clock location of the left
breast, consistent with known malignancy.
3.  No evidence for adenopathy.

THREE-DIMENSIONAL MR IMAGE RENDERING ON INDEPENDENT WORKSTATION:

Three-dimensional MR images were rendered by post-processing of the
original MR data on an independent workstation.  The three-
dimensional MR images were interpreted, and findings were reported
in the accompanying complete MRI report for this study.

BI-RADS CATEGORY 4:  Suspicious abnormality - biopsy should be
considered.

## 2010-11-18 ENCOUNTER — Telehealth: Payer: Self-pay | Admitting: Family Medicine

## 2010-11-18 LAB — COMPREHENSIVE METABOLIC PANEL
ALT: 10 U/L (ref 0–35)
AST: 13 U/L (ref 0–37)
Albumin: 3.6 g/dL (ref 3.5–5.2)
Alkaline Phosphatase: 45 U/L (ref 39–117)
BUN: 13 mg/dL (ref 6–23)
CO2: 28 mEq/L (ref 19–32)
Calcium: 10.5 mg/dL (ref 8.4–10.5)
Chloride: 102 mEq/L (ref 96–112)
Creatinine, Ser: 0.93 mg/dL (ref 0.40–1.20)
Glucose, Bld: 112 mg/dL — ABNORMAL HIGH (ref 70–99)
Potassium: 3.4 mEq/L — ABNORMAL LOW (ref 3.5–5.3)
Sodium: 139 mEq/L (ref 135–145)
Total Bilirubin: 0.2 mg/dL — ABNORMAL LOW (ref 0.3–1.2)
Total Protein: 8 g/dL (ref 6.0–8.3)

## 2010-11-18 LAB — CBC WITH DIFFERENTIAL/PLATELET
BASO%: 0.4 % (ref 0.0–2.0)
Basophils Absolute: 0 10*3/uL (ref 0.0–0.1)
EOS%: 2.2 % (ref 0.0–7.0)
Eosinophils Absolute: 0.1 10*3/uL (ref 0.0–0.5)
HCT: 33.4 % — ABNORMAL LOW (ref 34.8–46.6)
HGB: 10.6 g/dL — ABNORMAL LOW (ref 11.6–15.9)
LYMPH%: 24.5 % (ref 14.0–49.7)
MCH: 23.8 pg — ABNORMAL LOW (ref 25.1–34.0)
MCHC: 31.7 g/dL (ref 31.5–36.0)
MCV: 75.2 fL — ABNORMAL LOW (ref 79.5–101.0)
MONO#: 0.4 10*3/uL (ref 0.1–0.9)
MONO%: 7.4 % (ref 0.0–14.0)
NEUT#: 3.2 10*3/uL (ref 1.5–6.5)
NEUT%: 65.5 % (ref 38.4–76.8)
Platelets: 234 10*3/uL (ref 145–400)
RBC: 4.45 10*6/uL (ref 3.70–5.45)
RDW: 17.4 % — ABNORMAL HIGH (ref 11.2–14.5)
WBC: 4.9 10*3/uL (ref 3.9–10.3)
lymph#: 1.2 10*3/uL (ref 0.9–3.3)

## 2010-11-18 LAB — LACTATE DEHYDROGENASE: LDH: 115 U/L (ref 94–250)

## 2010-11-18 LAB — CANCER ANTIGEN 27.29: CA 27.29: 15 U/mL (ref 0–39)

## 2010-11-19 IMAGING — CR DG HIP W/ PELVIS BILAT
5 series · 5 of 5 positions shown · non-contrast
Comparison: CT abdomen pelvis of 07/08/2008

CLINICAL DATA: Low back and right hip pain for 5 days, no acute
injury

BILATERAL HIP WITH PELVIS - 4+ VIEW

[view not recorded (1 of 5)]
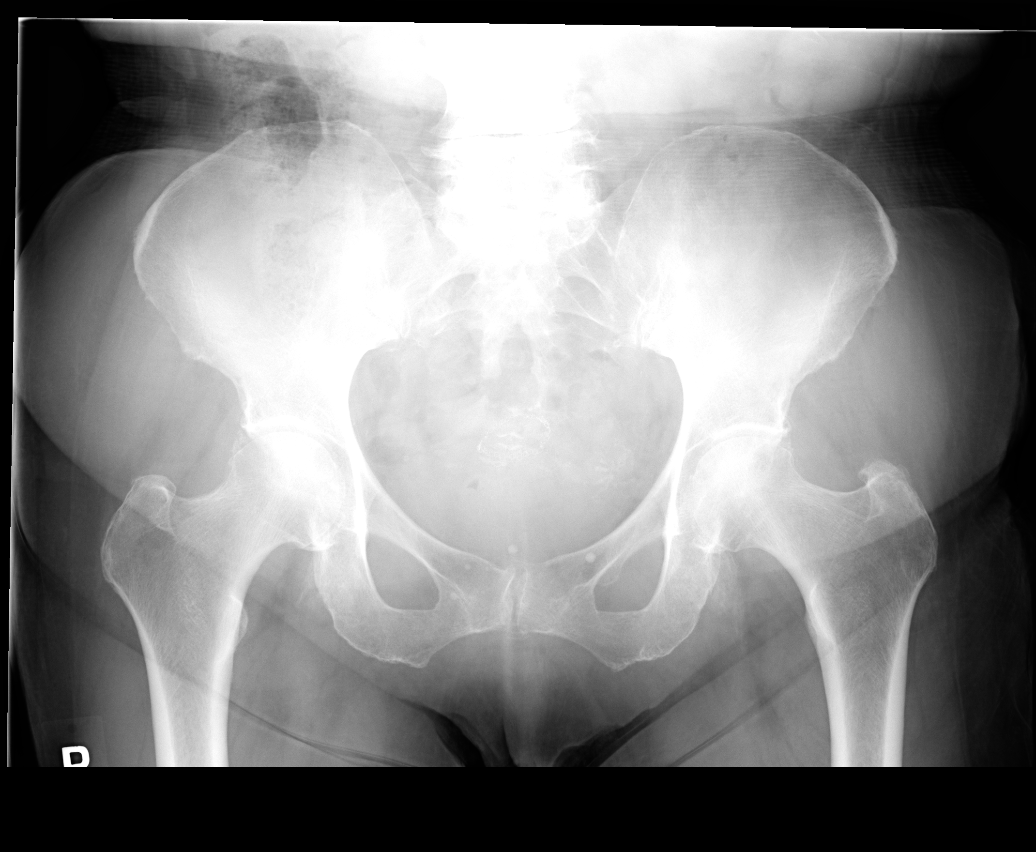

[view not recorded (2 of 5)]
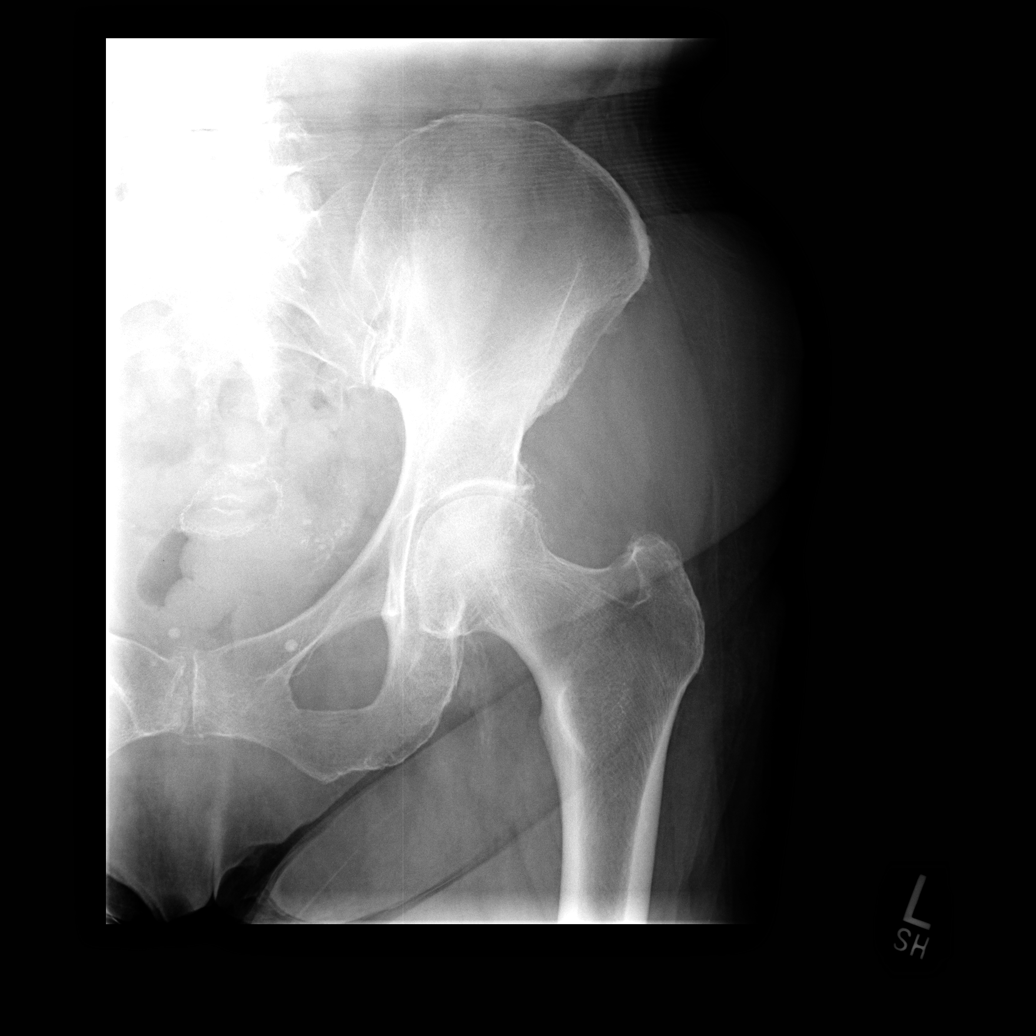

[view not recorded (3 of 5)]
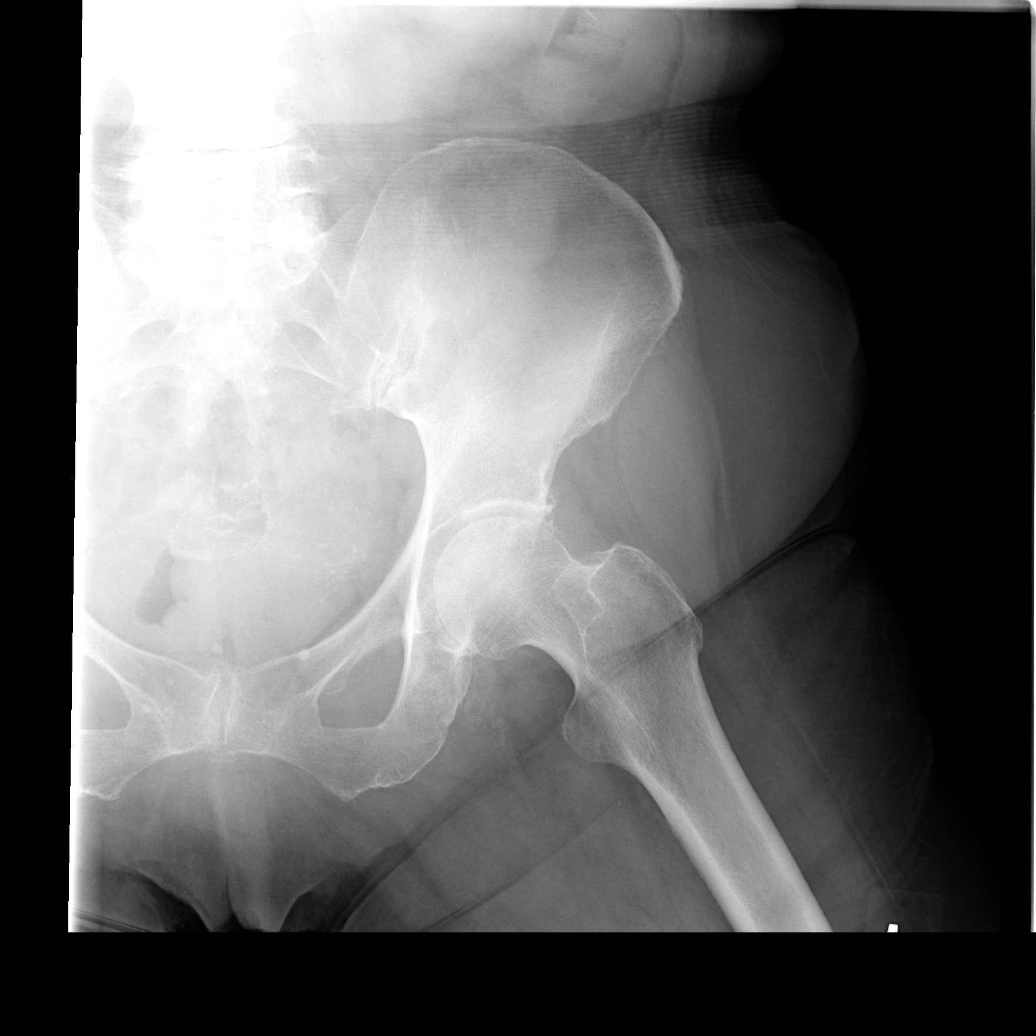

[view not recorded (4 of 5)]
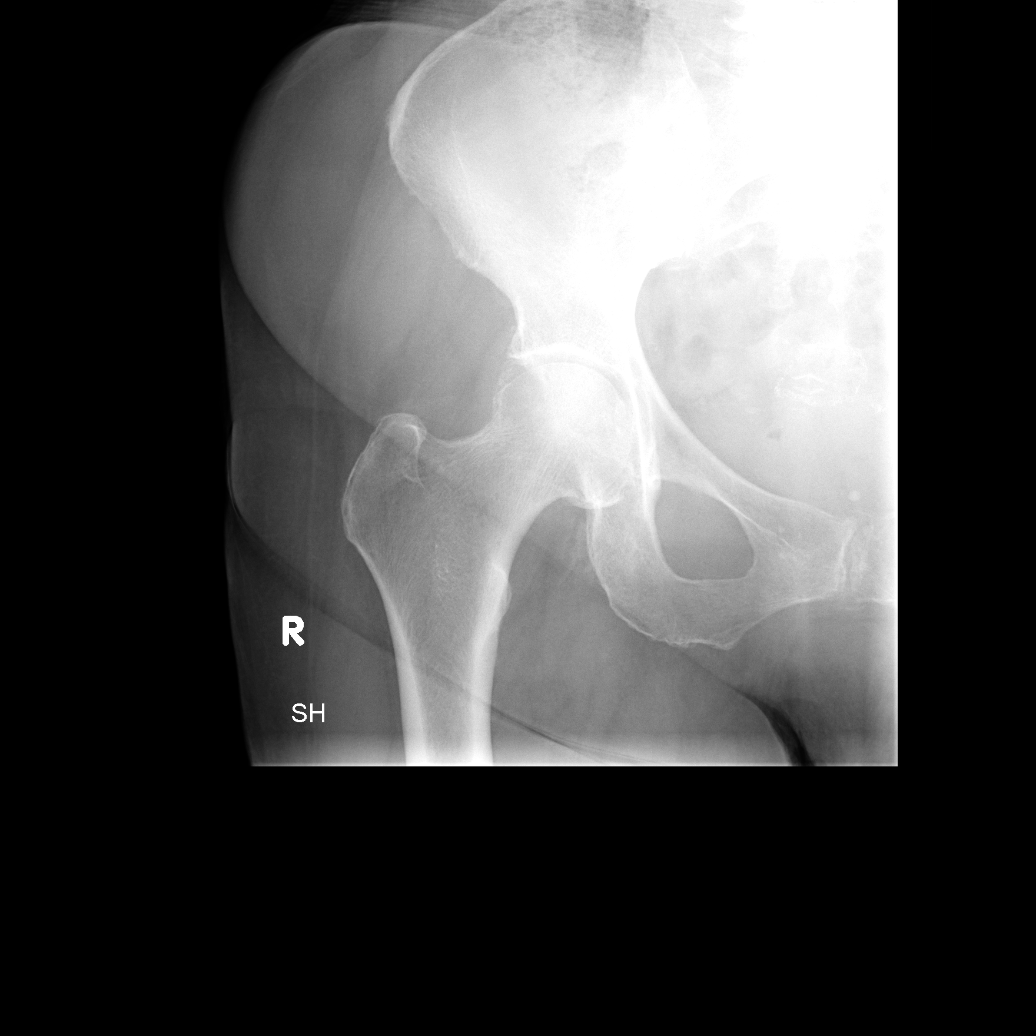

[view not recorded (5 of 5)]
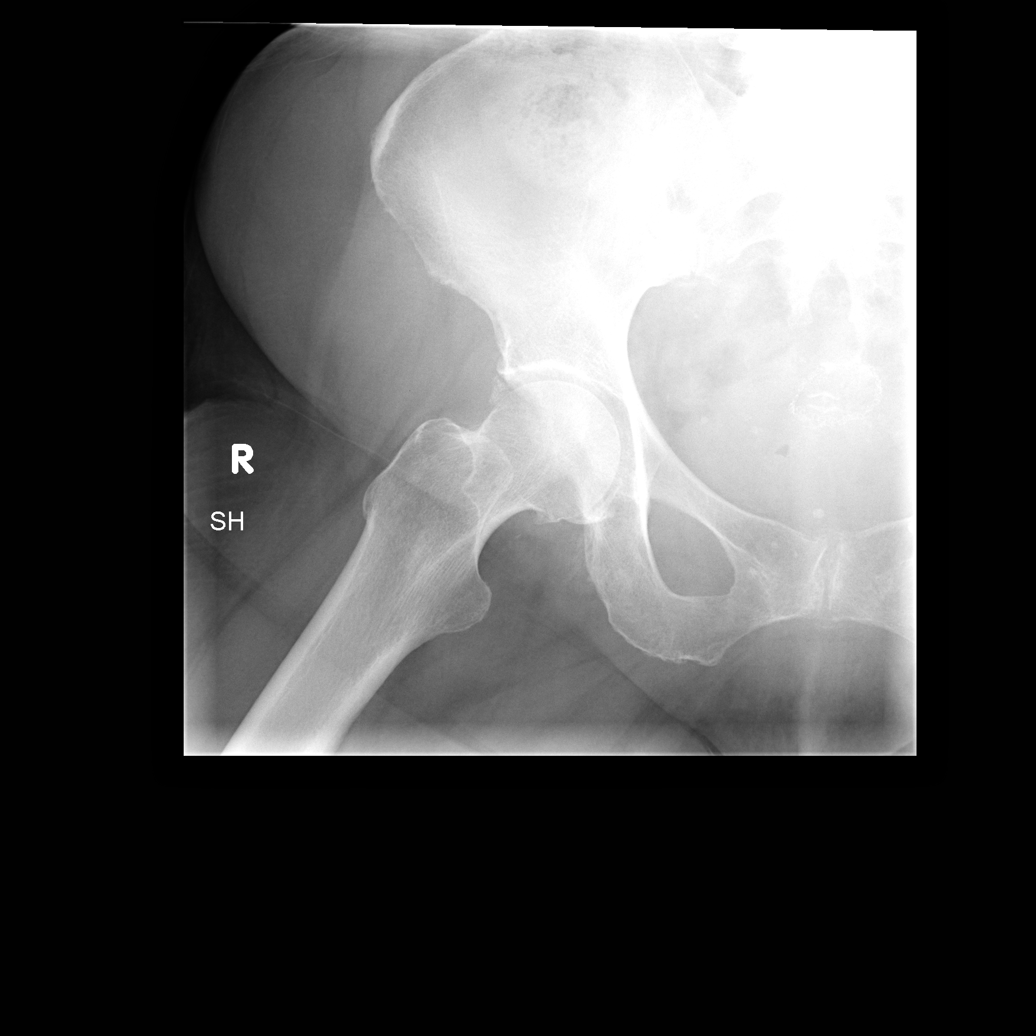

[5 of 5 positions shown; findings below may reference images not displayed]

FINDINGS: There are mild degenerative changes in the hips for age.
Minimal superior acetabular spurring is present.  There is some
chondrocalcinosis present in both hips which could indicate CPPD
arthropathy.  The pelvic rami are intact.  There are degenerative
changes in the lower lumbar spine.  Surgical sutures are noted in
the mid pelvis.
IMPRESSION: Mild degenerative change in the hips with chondrocalcinosis
suggesting CPPD arthropathy of the hips.  No acute abnormality.

## 2010-11-19 IMAGING — CR DG LUMBAR SPINE COMPLETE 4+V
5 series · 5 of 5 positions shown · non-contrast
Comparison: Lumbar spine films of 12/26/2009

CLINICAL DATA: Low back and right hip pain for 5 days, no injury

LUMBAR SPINE - COMPLETE 4+ VIEW

[view not recorded (1 of 5)]
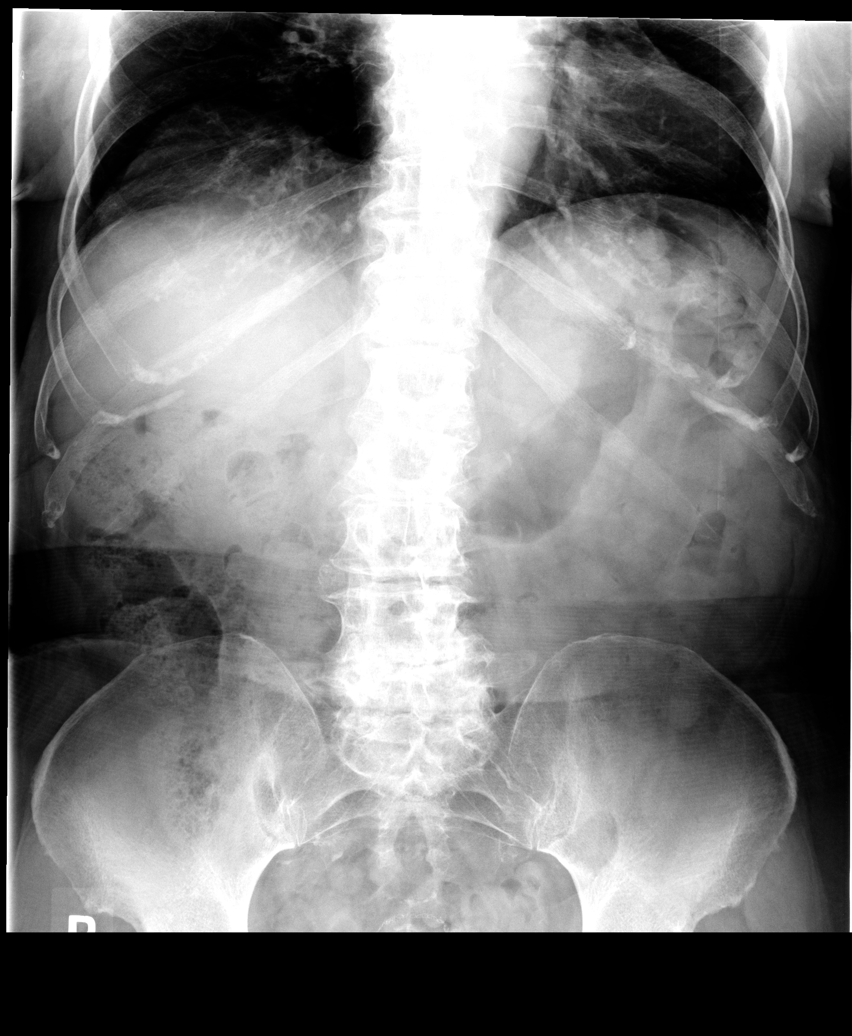

[view not recorded (2 of 5)]
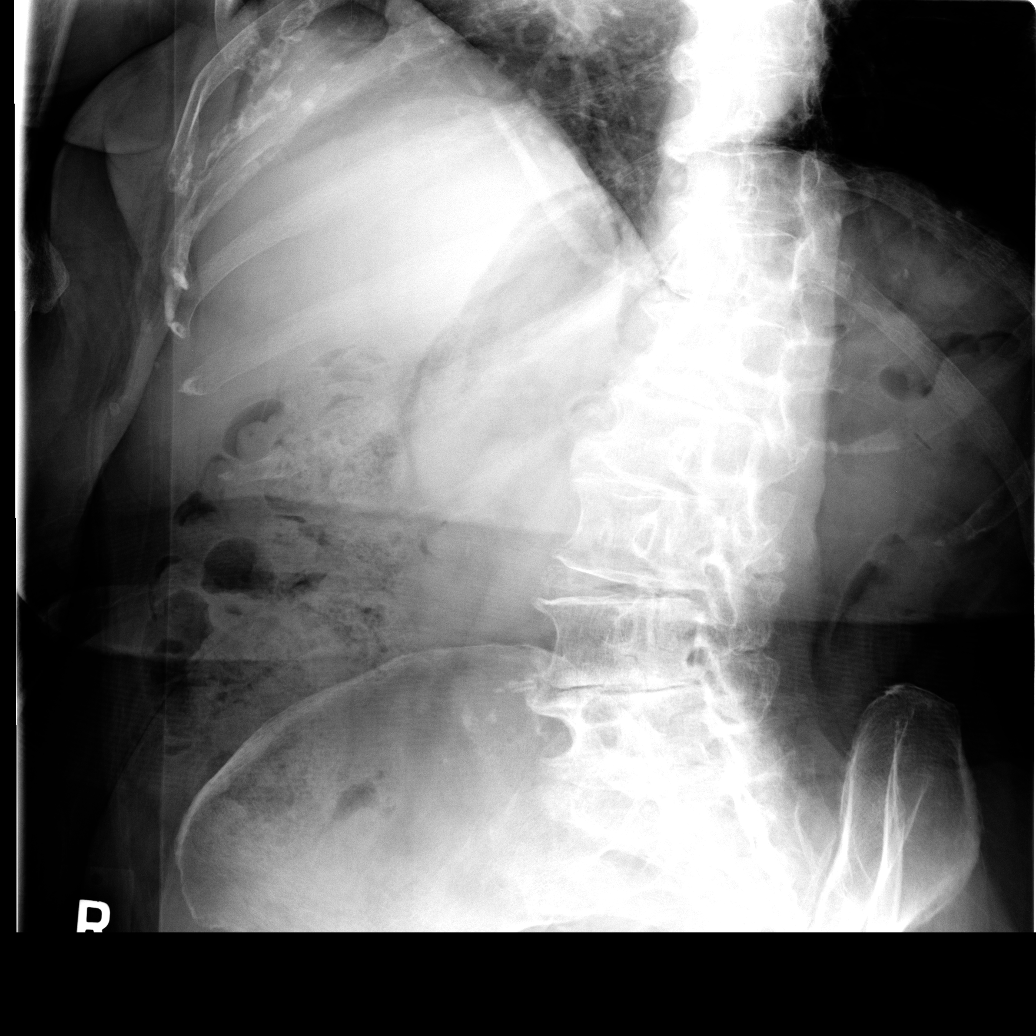

[view not recorded (3 of 5)]
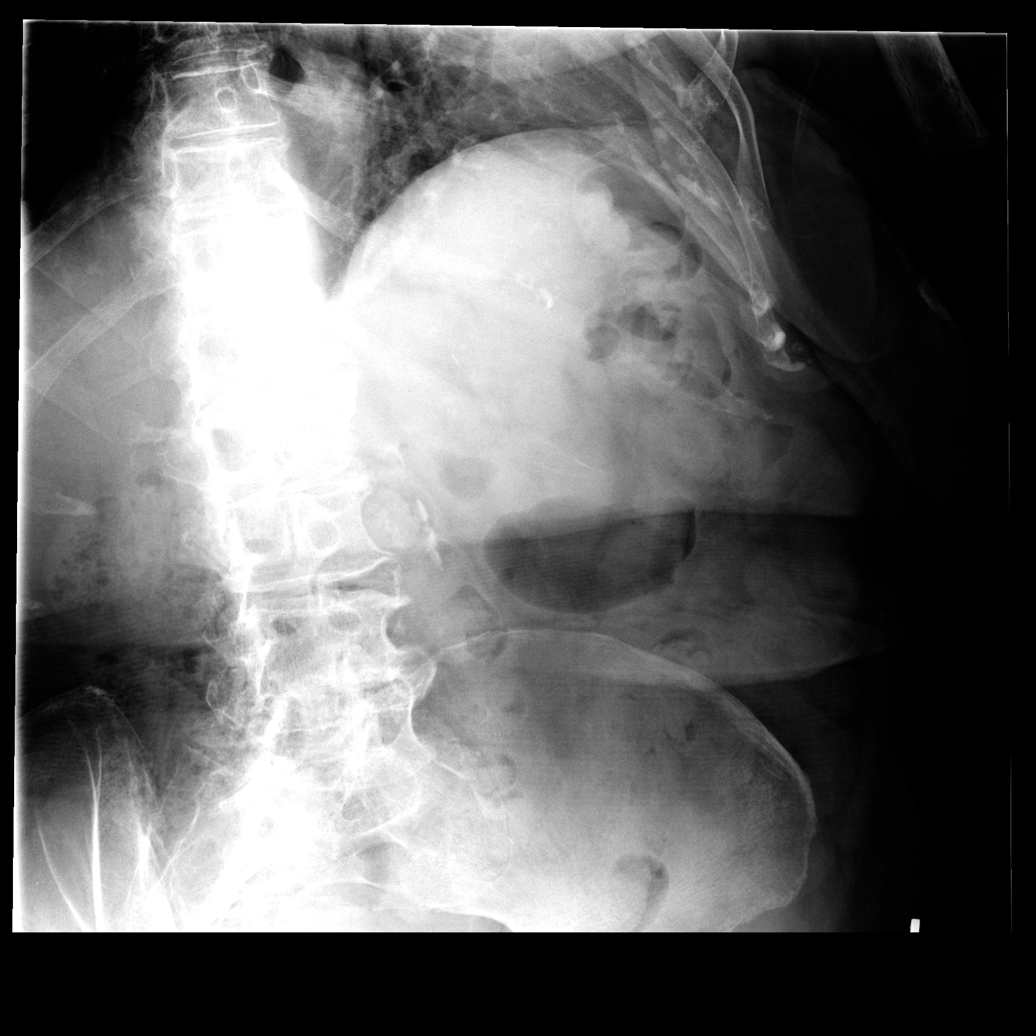

[view not recorded (4 of 5)]
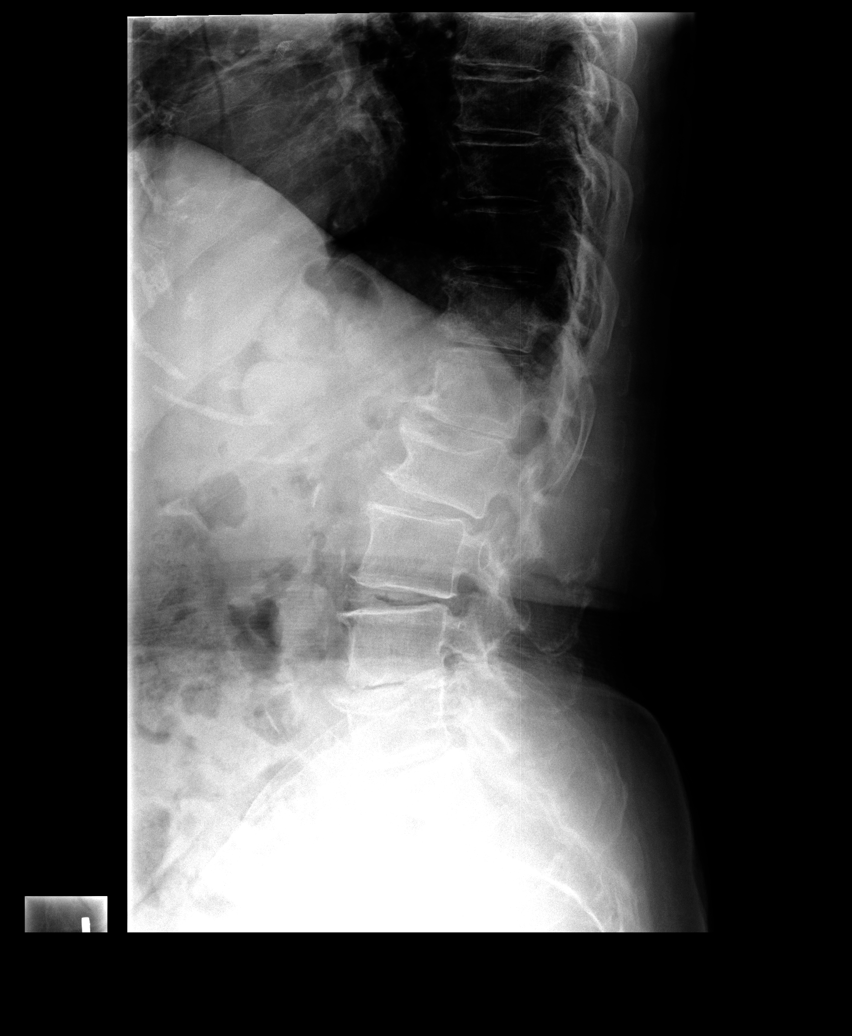

[view not recorded (5 of 5)]
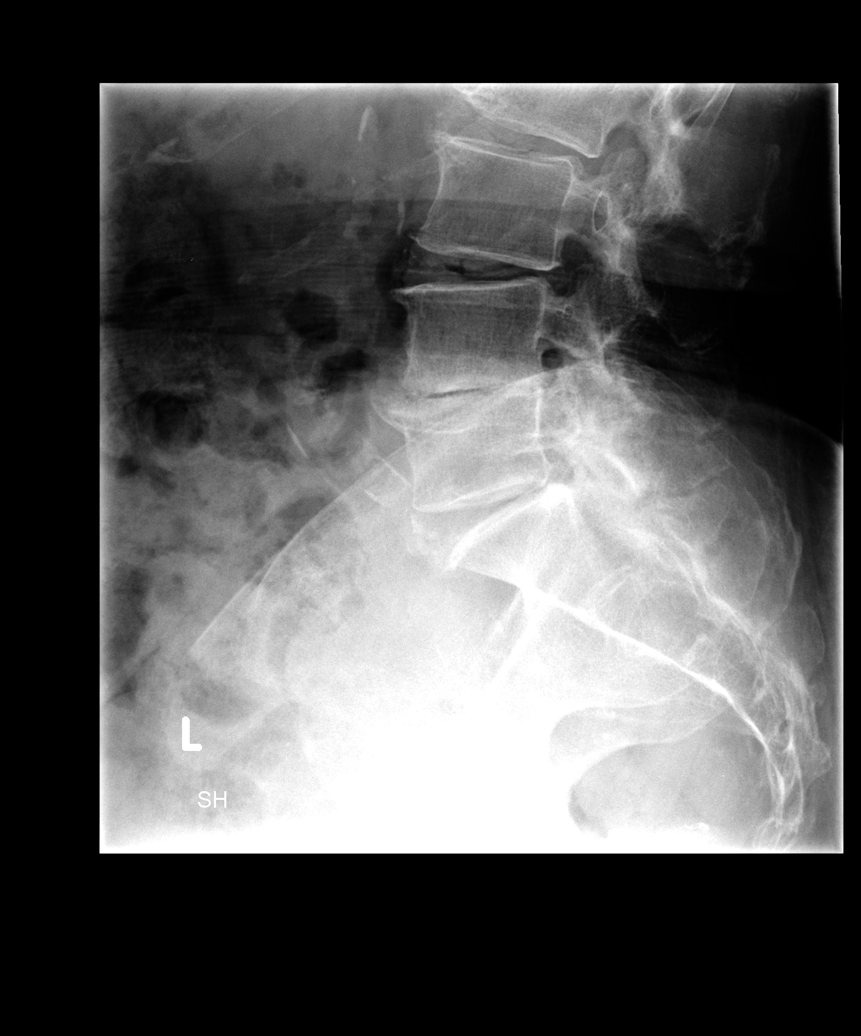

[5 of 5 positions shown; findings below may reference images not displayed]

FINDINGS: The lumbar vertebrae are stable in alignment.
Degenerative disc disease is most marked at the L4-5 level but is
present diffusely.  Old mild compression of L2 is stable.  No new
compression deformity is seen.
IMPRESSION: Degenerative disc disease particularly at L4-5.  Stable mild
compression of L2.  No acute abnormality.

## 2010-11-24 ENCOUNTER — Ambulatory Visit (HOSPITAL_COMMUNITY)
Admission: RE | Admit: 2010-11-24 | Discharge: 2010-11-24 | Payer: Self-pay | Source: Home / Self Care | Attending: Hematology and Oncology | Admitting: Hematology and Oncology

## 2010-11-28 ENCOUNTER — Telehealth: Payer: Self-pay | Admitting: Family Medicine

## 2010-12-03 ENCOUNTER — Encounter: Payer: Self-pay | Admitting: Family Medicine

## 2010-12-04 ENCOUNTER — Encounter: Payer: Self-pay | Admitting: Hematology and Oncology

## 2010-12-04 IMAGING — CT CT PARANASAL SINUSES LIMITED
1 series · 16 of 30 positions shown, 20 images · non-contrast
Comparison: CTA neck 08/18/2010.

CLINICAL DATA: 86-year-old female with dizziness and sinusitis.
History of colon cancer.

CT PARANASAL SINUS LIMITED WITHOUT CONTRAST
TECHNIQUE: Multidetector CT images of the paranasal sinuses were
obtained in a single plane without contrast.

[Series 3: ltd sinuses 3.0 h40s st · axial · 0.29mm/px · z∈[-154,-58]mm · 16 of 36 slices shown, 20 images]
[im 2/36  brain]
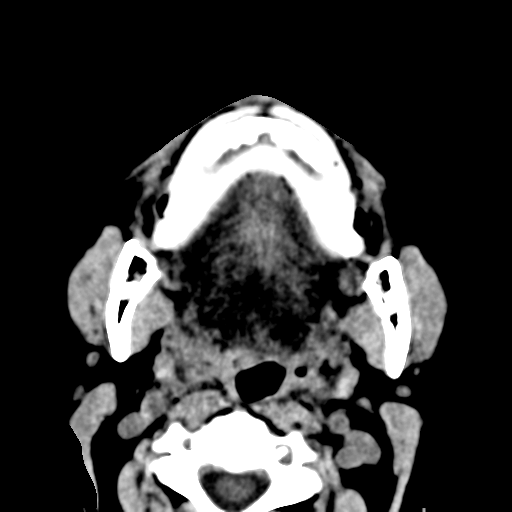
[im 2/36  bone]
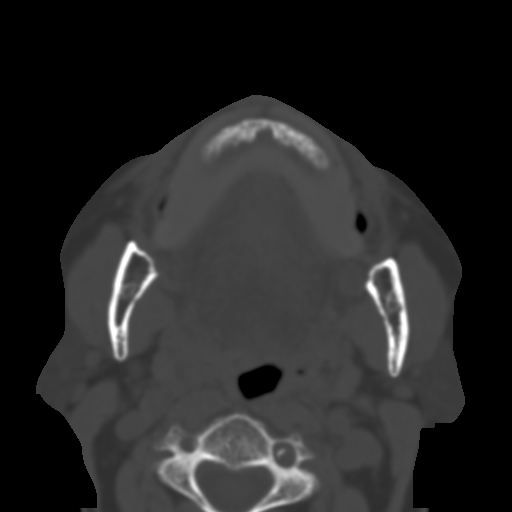
[im 4/36  bone]
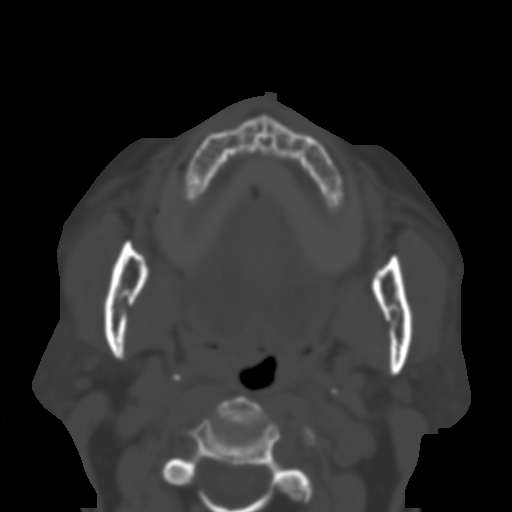
[im 7/36  bone]
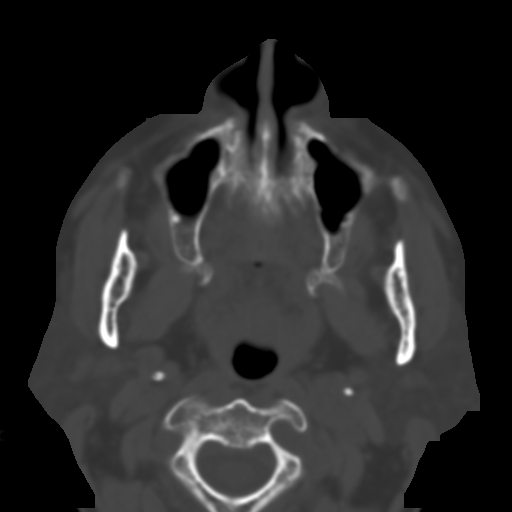
[im 9/36  bone]
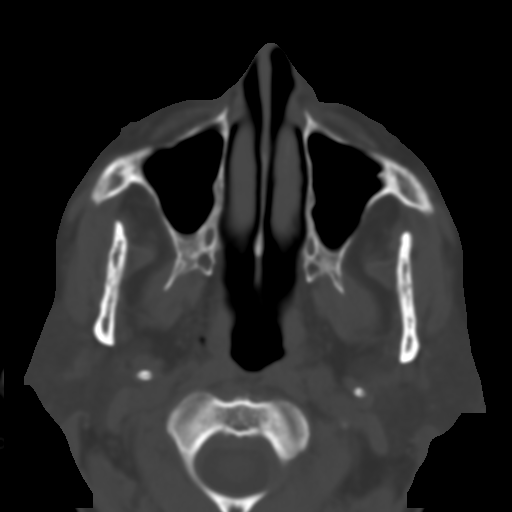
[im 10/36  brain]
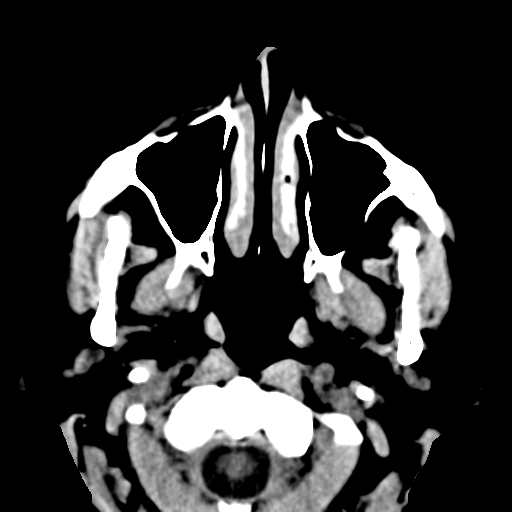
[im 10/36  bone]
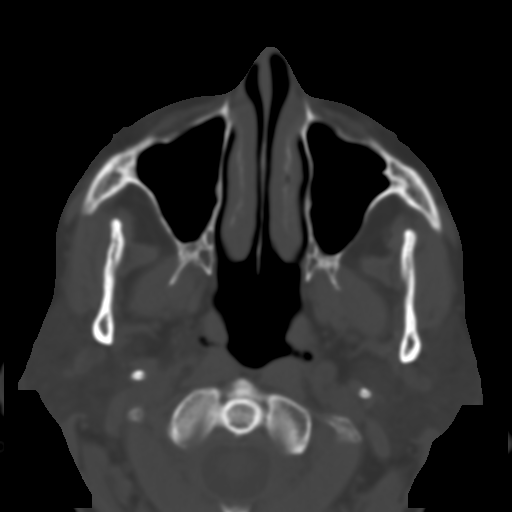
[im 13/36  bone]
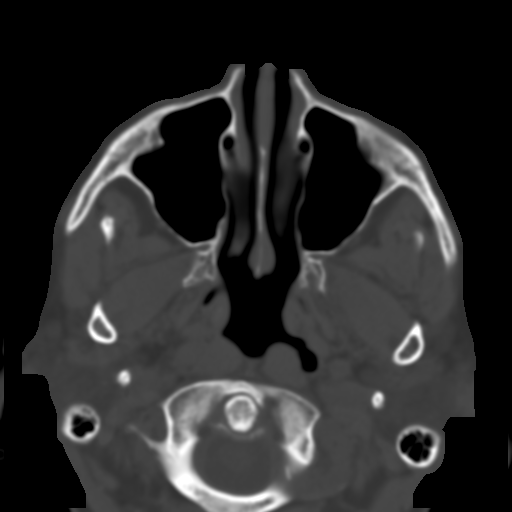
[im 15/36  bone]
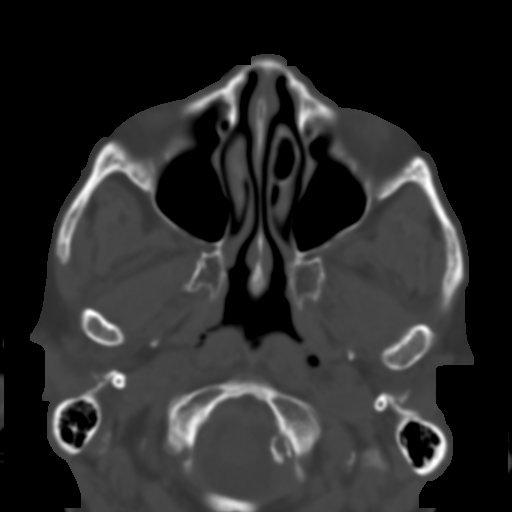
[im 17/36  bone]
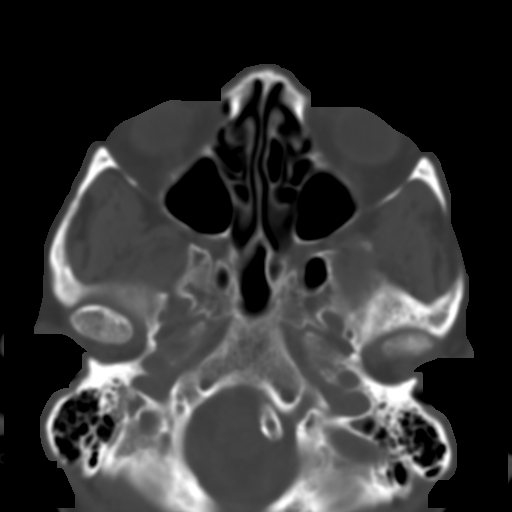
[im 19/36  brain]
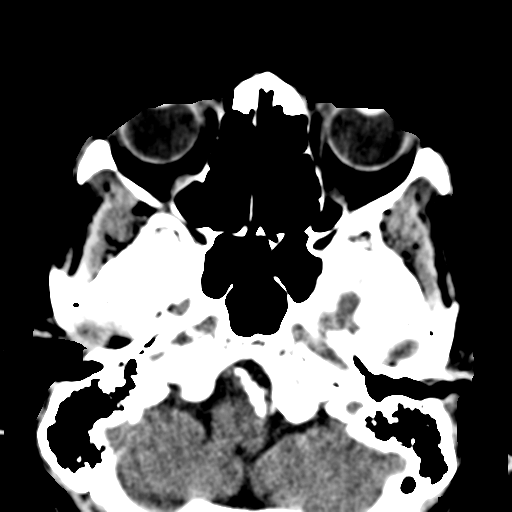
[im 19/36  bone]
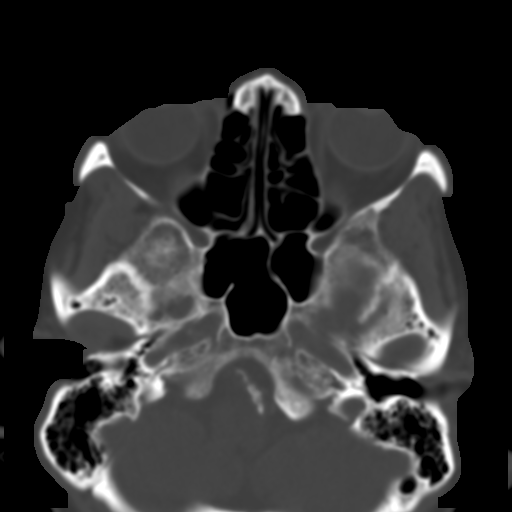
[im 21/36  bone]
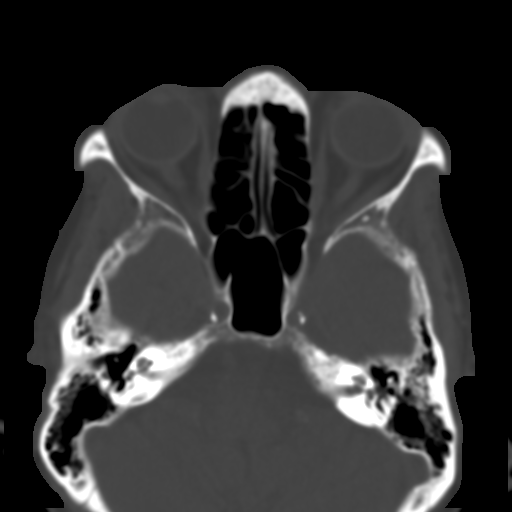
[im 23/36  bone]
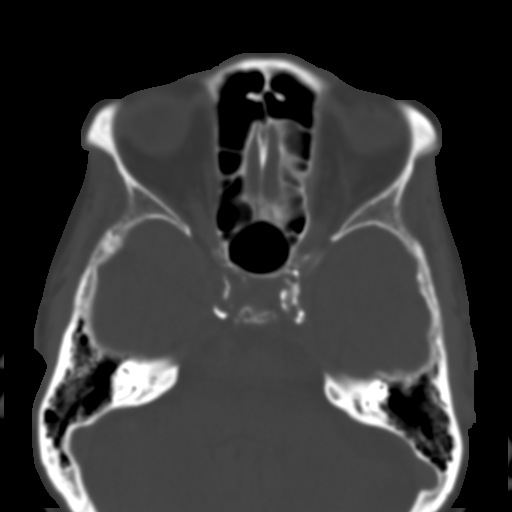
[im 26/36  bone]
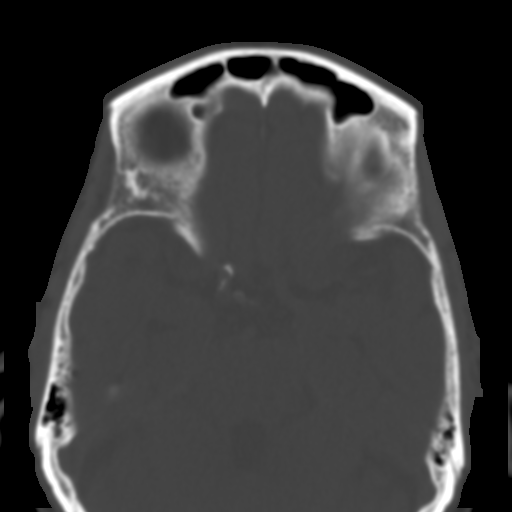
[im 27/36  brain]
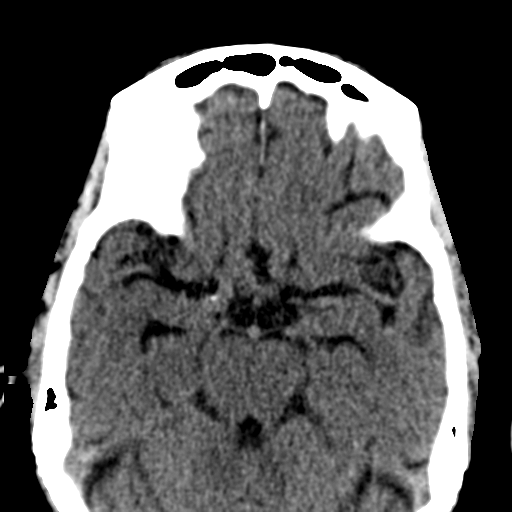
[im 27/36  bone]
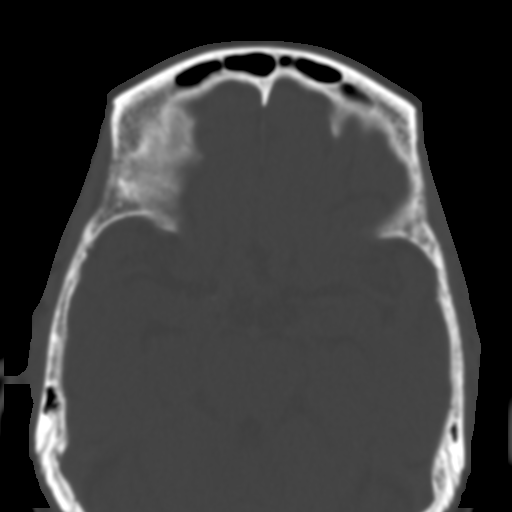
[im 29/36  bone]
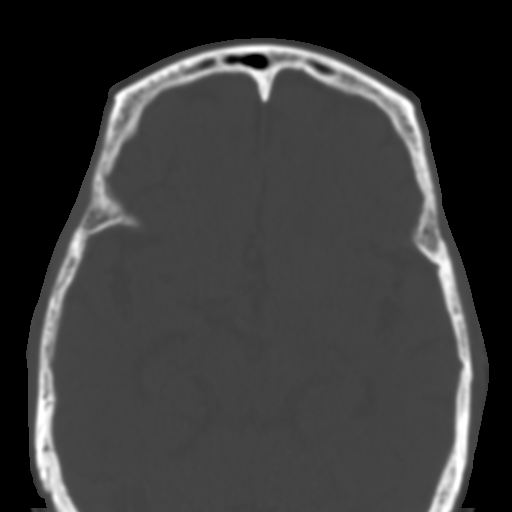
[im 32/36  bone]
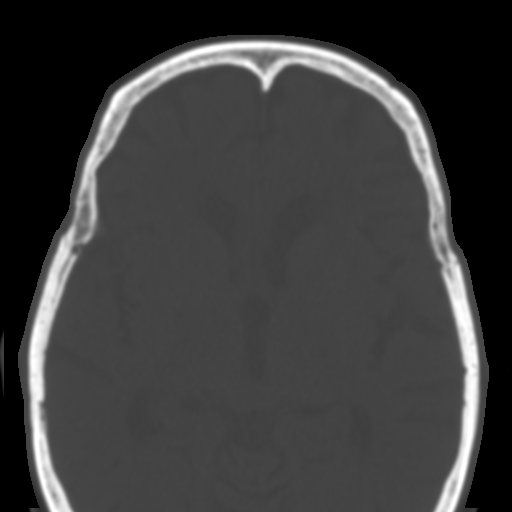
[im 34/36  bone]
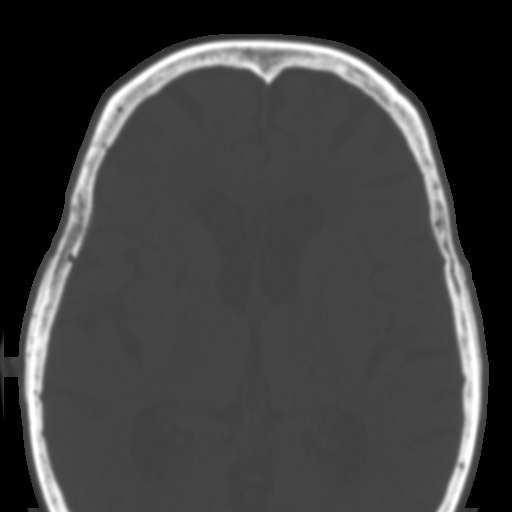

[16 of 30 positions shown; findings below may reference images not displayed]

FINDINGS: Visualized deep soft tissue spaces of the face are within
normal limits. Calcified atherosclerosis at the skull base.  Stable
visualized brain parenchyma with probable chronic lacunar infarcts
in the right deep gray matter nuclei and white matter tracts white
matter capsules. Visualized orbit soft tissues are within normal
limits.

The paranasal sinuses are clear.  The tympanic cavities and mastoid
air cells are clear.No acute osseous abnormality identified.
IMPRESSION: Normal paranasal sinuses and visualized mastoid and tympanic
cavities. Stable noncontrast CT appearance of the visualized brain.

## 2010-12-04 IMAGING — CR DG CHEST 2V
2 series · 2 of 2 positions shown · non-contrast
Comparison: 08/17/2010

CLINICAL DATA: Near-syncope

CHEST - 2 VIEW

[w chest lat]
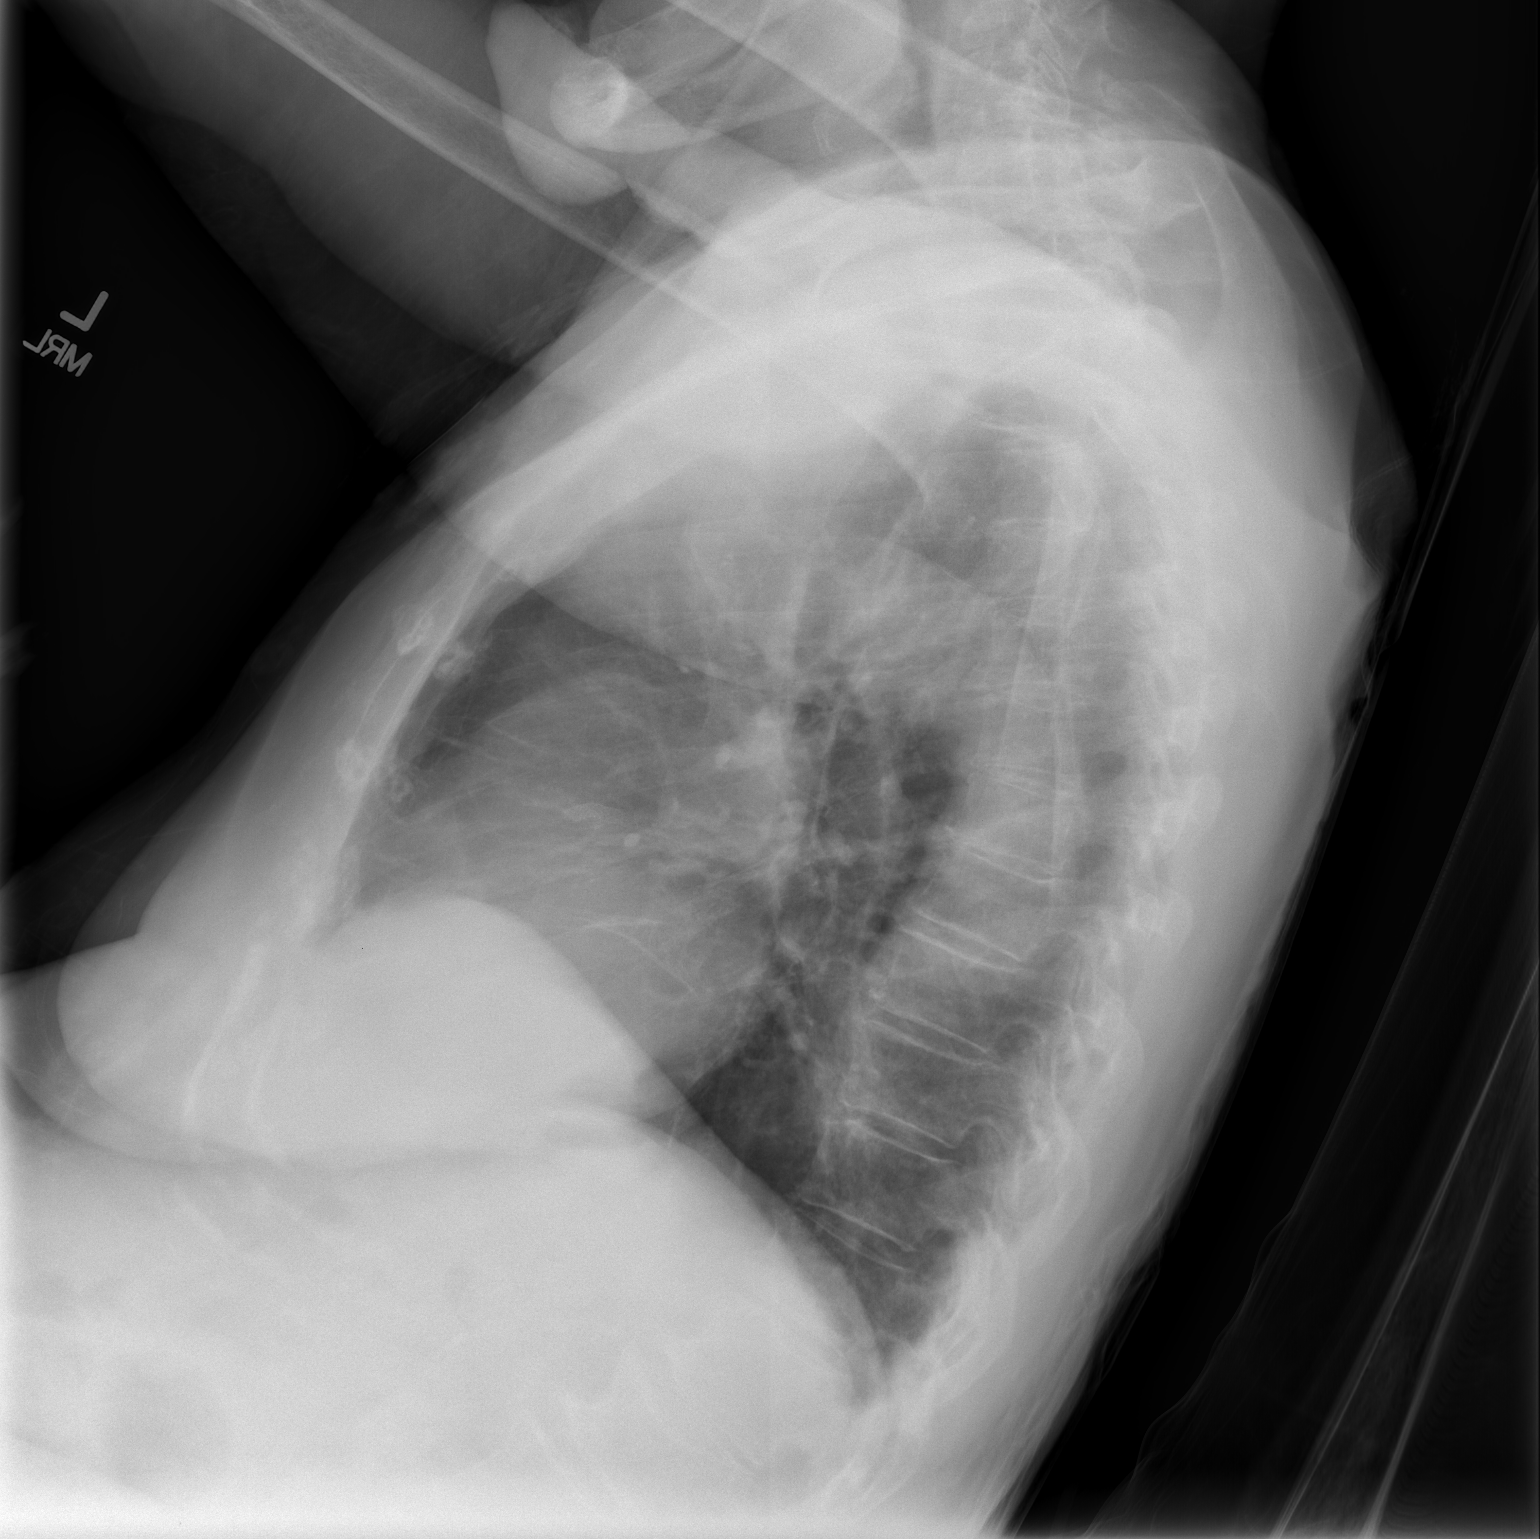

[view not recorded]
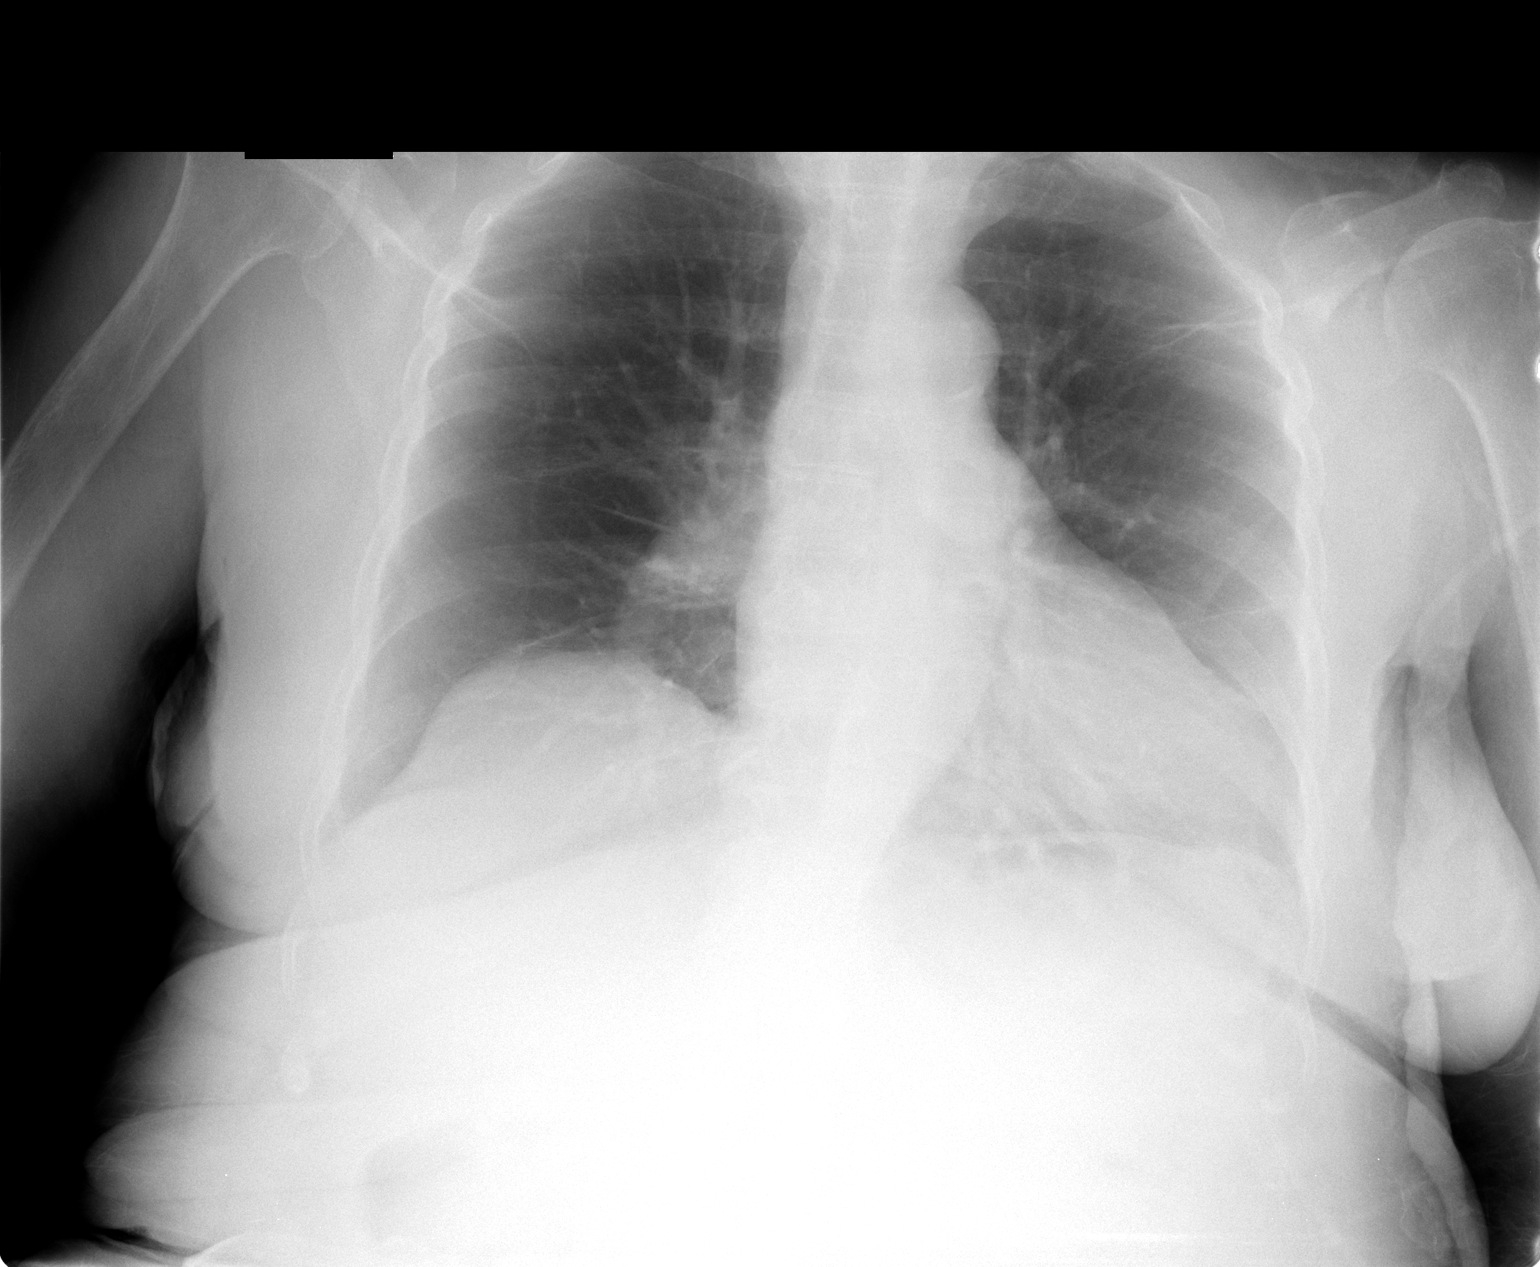

[2 of 2 positions shown; findings below may reference images not displayed]

FINDINGS: Heart is mildly enlarged.  Lungs are clear other than
minimal subsegmental atelectasis.  No pneumothorax.  No pleural
effusion.
IMPRESSION: No active cardiopulmonary disease.

## 2010-12-12 ENCOUNTER — Telehealth: Payer: Self-pay | Admitting: Family Medicine

## 2010-12-13 NOTE — Op Note (Signed)
Summary: Consent for Medical procedures  Consent for Medical procedures   Imported By: Marily Memos 10/13/2010 12:12:32  _____________________________________________________________________  External Attachment:    Type:   Image     Comment:   External Document

## 2010-12-13 NOTE — Progress Notes (Signed)
Summary: Checking on pt.   Phone Note Outgoing Call   Call placed by: Cat Ta MD,  October 14, 2010 1:19 PM Call placed to: Patient Summary of Call: Called to check on pt since I called her house yesterday and was told that she was in ER for dizziness that lasted only a few minutes.  I read ER note and it states that pt was discharged home because labs (POC, CBC, Cmet, EKG, CT head) looked normal.  She was asymptomatic (no more dizziness).  Today she feels fine.  No more episodes.   We also discussed her not being able to contact [facility] or [facility].  Pt states that she spoke with [facility] Center today and they are sending her information to fill out.   Pt to call me if she has questions, concerns, or feels bad.  Initial call taken by: Cat Ta MD,  October 14, 2010 1:24 PM

## 2010-12-13 NOTE — Assessment & Plan Note (Signed)
Summary: f/u,df   Vital Signs:  Patient profile:   77 year old female Height:      58 inches Weight:      126.13 pounds Temp:     97.7 degrees F oral Pulse rate:   81 / minute BP sitting:   151 / 80  (left arm)  Vitals Entered By: Terese Door (December 08, 2009 2:10 PM) CC: Cold/allergies Is Patient Diabetic? Yes Pain Assessment Patient in pain? no        Primary Care Provider:  Ardeen Garland  MD  CC:  Cold/allergies.  History of Present Illness: [name] was coming in for follow-up of diabetes and HTN but is too early.  Was just seen about 6 weeks ago.  Does have complain of cold/allergy symptoms and would like a flu shot.  Has had increase in cough and nasal congestion for about a week.  No fever.  No trouble breathing.  Loratadine not helping much but the flonase seems to help. No n/v/d.  Her arthritis is causing some trouble in the cold, wet weather but otherwise she endorses doing very well and having no problems.   Habits & Providers  Alcohol-Tobacco-Diet     Other Tobacco snuff  Allergies: 1)  ! * Lsinopril  Physical Exam  General:  alert, well-developed, well-nourished, and well-hydrated.  NAD, cooperative to exam vitals reviewed  Eyes:  conjunctiva slightly injected, moist Ears:  bilateral TMs normal Mouth:  OP claer and moist Lungs:  normal WOB, clear to ausc bilaterally Heart:  RRR without murmur   Impression & Recommendations:  Problem # 1:  URI (ICD-465.9) Assessment New  Appears to have viral URI.  Advised to try robitussin (can't afford Rx cough meds).  Stay well-hydrated.  Continue the flonase.  REturn if she becomes febrile, has difficulty breathing, or isn't better in antoher week.  Her updated medication list for this problem includes:    Adult Aspirin Low Strength 81 Mg Chew (Aspirin) .Marland Kitchen... Take 1 tablet by mouth once a day    Loratadine 10 Mg Tabs (Loratadine) .Marland Kitchen... 1 tab daily as needed for allergy symptoms  Orders: [facility]- Est Level  3  ([address])  Complete Medication List: 1)  Adult Aspirin Low Strength 81 Mg Chew (Aspirin) .... Take 1 tablet by mouth once a day 2)  Caltrate 600+d Plus 600-400 Mg-unit Tabs (Calcium carbonate-vit d-min) .... Take 1 tablet by mouth twice a day 3)  Carvedilol 12.5 Mg Tabs (Carvedilol) .Marland Kitchen.. 1 pill by mouth 2 times daily 4)  Glipizide 5 Mg Tabs (Glipizide) .Marland Kitchen.. 1 tab by mouth in the morning for diabetes 5)  Metformin Hcl 1000 Mg Tabs (Metformin hcl) .Marland Kitchen.. 1 tab by mouth two times a day 6)  Plavix 75 Mg Tabs (Clopidogrel bisulfate) 7)  Zocor 40 Mg Tabs (Simvastatin) .... One tab by mouth daily 8)  Ferrous Sulfate 325 (65 Fe) Mg Tbec (Ferrous sulfate) .... Two times a day 9)  Norvasc 10 Mg Tabs (Amlodipine besylate) .Marland Kitchen.. 1 by mouth daily 10)  Hydrochlorothiazide 12.5 Mg Caps (Hydrochlorothiazide) .Marland Kitchen.. 1 tab by mouth daily 11)  Fluoxetine Hcl 20 Mg Tabs (Fluoxetine hcl) .... Take 1 tab by mouth daily 12)  Ventolin Hfa 108 (90 Base) Mcg/act Aers (Albuterol sulfate) .... 2-4 puffs every 4 hours as needed for shortness of breath or wheezing. 13)  Flovent Diskus 100 Mcg/blist Aepb (Fluticasone propionate (inhal)) .Marland Kitchen.. 1 puff inhaled two times a day 14)  Colchicine 0.6 Mg Tabs (Colchicine) .Marland Kitchen.. 1 tab by mouth  three times a day as needed for foot pain 15)  Megestrol Acetate 40 Mg/ml Susp (Megestrol acetate) .Marland Kitchen.. 10ml by mouth daily quantity: 1 month 16)  Loratadine 10 Mg Tabs (Loratadine) .Marland Kitchen.. 1 tab daily as needed for allergy symptoms 17)  Fluticasone Propionate 50 Mcg/act Susp (Fluticasone propionate) .... 2 sprays each nostril daily 18)  Gabapentin 300 Mg Caps (Gabapentin) .Marland Kitchen.. 1 tab by mouth qhs 19)  Keflex 500 Mg Caps (Cephalexin) .Marland Kitchen.. 1 tab by mouth two times a day for 7 days  Other Orders: Influenza Vaccine MCR ([address])  Patient Instructions: 1)  It was nice to see you today!  I'm glad you are feeling well.  Sorry to hear your arthritis give you trouble in the rain! 2)  You are due for your next  check up for diabetes, high blood pressure, and other problems in Mid - March.  Just call back to make your appointment for mid-march. 3)  Have a great day!   Immunizations Administered:  Influenza Vaccine # 1:    Vaccine Type: Fluvax MCR    Site: left deltoid    Mfr: GlaxoSmithKline    Dose: 0.5 ml    Route: IM    Given by: Tessie Fass CMA    Exp. Date: 05/12/2010    Lot #: [id number]    VIS given: 06/06/07 version given December 08, 2009.  Flu Vaccine Consent Questions:    Do you have a history of severe allergic reactions to this vaccine? no    Any prior history of allergic reactions to egg and/or gelatin? no    Do you have a sensitivity to the preservative Thimersol? no    Do you have a past history of Guillan-Barre Syndrome? no    Do you currently have an acute febrile illness? no    Have you ever had a severe reaction to latex? no    Vaccine information given and explained to patient? yes    Are you currently pregnant? no

## 2010-12-13 NOTE — Miscellaneous (Signed)
Summary: NPI  Clinical Lists Changes [facility] called for NPI to cover our ot's visit to them today. it was a 6 month f/u. I gave the number but asked that they send OV notes here. I will ask md how many more visits he wants to authorize. [phone].Golden Circle RN  April 14, 2010 3:45 PM  I dont know that she needs any more.  We can mange her HLD and HTN.  If we hit problems, we can refer her back. Ardeen Garland  MD  April 15, 2010 8:47 AM    Appended Document: [facility] spoke with [facility] & told them no more authorized visits. we will manage & send back if necessary

## 2010-12-13 NOTE — Letter (Signed)
Summary: [facility]   Imported By: Clydell Hakim 04/22/2010 15:14:00  _____________________________________________________________________  External Attachment:    Type:   Image     Comment:   External Document  Appended Document: [facility] Cont Crestor 20, Norvasc 10, HCTZ 12..5, Carvedilol 12.5 two times a day.

## 2010-12-13 NOTE — Assessment & Plan Note (Signed)
Summary: hip pain,df   Vital Signs:  Patient profile:   77 year old female Height:      58 inches Weight:      124.5 pounds BMI:     26.11 Temp:     98.6 degrees F oral Pulse rate:   91 / minute BP sitting:   163 / 82  (left arm) Cuff size:   regular  Vitals Entered By: Jimmy Footman, CMA (September 28, 2010 1:21 PM) CC: right side hip pain x1 week Is Patient Diabetic? Yes Did you bring your meter with you today? No Pain Assessment Patient in pain? yes     Location: hip Intensity: 10 Type: aching   Primary Care Provider:  Cat Ta MD  CC:  right side hip pain x1 week.  History of Present Illness: 77 y/o F with new dx of b/l breast cancer and known L2 compression presents for R hip pain x 1 week.  R hip pain:  started last week suddenly.  She denies any trauma.  She first felt pain on lateral side of R hip.  She also has sharp pain in R lower back/buttock that started a few days ago.  Her back pain makes it difficult for her to lie down.  Pain also wakes her up at night.  Pain is sharp and lasts from seconds to minutes.  She has tried extra strength tylenol daily without much relief.     Red Flags Fecal/urinary incontinence: no Weakness: no Fever/chills: no Night pain: pain wakes her up from sleep Unexplained weight loss: no No relief with bedrest: no Cancer/immunosuppression: new Dx of b/l breast cancer, will undergo b/l mastectomy and hormone therapy IV drug use:use PMH of osteoporosis or chronic steroid use: no     Habits & Providers  Alcohol-Tobacco-Diet     Tobacco Status: never  Allergies (verified): 1)  ! * Lsinopril  Past History:  Past Medical History: Back Pain, known L2 compression fracture Colon cancer, hx of  (s/p resection)  followed by [facility] Congestive heart failure - [facility] - last EF 25-35% in May 2008 Coronary artery disease Diabetes mellitus, type II GERD Gastrointestinal hemorrhage, hx of  (Followed by Dr  Loreta Ave) Gout Hyperlipidemia Hypertension Anemia-iron deficiency GYN:  Dr Stefano Gaul History of GI bleed Diverticulosis Seasonal allergic rhinitis  B/L Breast cancer 09/2010  Social History: Smoking Status:  never  Review of Systems General:  Denies chills, fever, and weight loss. MS:  Complains of joint pain and low back pain; denies joint redness, joint swelling, loss of strength, and muscle weakness.  Physical Exam  General:  Well-developed,well-nourished,in no acute distress; alert,appropriate and cooperative throughout examination. vitals reviewed  Msk:  Back Exam: Inspection: normal  Motion: R leg SLR lying caused SI pain  L leg XLR lying caused minimal SI pain Seated HS Flexibility: Palpable tenderness: at R SI joint and in inferior areas.  Also palpable tenderness in greater trochanteric area and piriformis  FABER: Sensory change:no Reflex change:no  Strength at foot is normal.   Gait Walking: is stable and normal with her walker             Hip: Decreased ROM of internal rotation bilaterally  ROM on external rotation causes pain bilaterally Pelvic alignment unremarkable to inspection and palpation. Pain over greater throchanteric area     Impression & Recommendations:  Problem # 1:  HIP PAIN, RIGHT (ICD-719.45) Assessment New Pt has new dx of b/l breast cancer.  With new complaint  of hip pain and back pain I am concerned for metastatic process.  Will get b/l hip xray and lumbar series.  If xray does not show acute or met process, pt will come back tomorrow and I will give steroid injection into greater trochaneric area, where she is tender to palpation.  In the meantime will Rx Hydrocodone for pain.  She also has tenderness in R SI joint.  We discussed that if this cont to bother her, then I can refer her to Orthopedics to be injected under fluroscopy.    Her updated medication list for this problem includes:    Norco 5-325 Mg Tabs (Hydrocodone-acetaminophen)  .Marland Kitchen... 1 tab by mouth every 4-6 hours as needed pain  Orders: Radiology other (Radiology Other) [facility]- Est Level  3 ([address])  Complete Medication List: 1)  Adult Aspirin Low Strength 81 Mg Chew (Aspirin) .... Take 1 tablet by mouth once a day 2)  Glipizide 5 Mg Tabs (Glipizide) .Marland Kitchen.. 1 tab by mouth in the morning for diabetes 3)  Metformin Hcl 1000 Mg Tabs (Metformin hcl) .Marland Kitchen.. 1 tab by mouth two times a day 4)  Crestor 20 Mg Tabs (Rosuvastatin calcium) .Marland Kitchen.. 1 tab by mouth at bedtime for cholesterol 5)  Carvedilol 12.5 Mg Tabs (Carvedilol) .Marland Kitchen.. 1 pill by mouth 2 times daily 6)  Norvasc 10 Mg Tabs (Amlodipine besylate) .Marland Kitchen.. 1 by mouth daily 7)  Hydrochlorothiazide 25 Mg Tabs (Hydrochlorothiazide) .Marland Kitchen.. 1 tab by mouth daily for blood pressure 8)  Ventolin Hfa 108 (90 Base) Mcg/act Aers (Albuterol sulfate) .... 2-4 puffs every 4 hours as needed for shortness of breath or wheezing. 9)  Flovent Diskus 100 Mcg/blist Aepb (Fluticasone propionate (inhal)) .Marland Kitchen.. 1 puff inhaled two times a day 10)  Fluticasone Propionate 50 Mcg/act Susp (Fluticasone propionate) .... 2 sprays each nostril daily 11)  Plavix 75 Mg Tabs (Clopidogrel bisulfate) .Marland Kitchen.. 1 tab by mouth daily 12)  Ranitidine Hcl 150 Mg Caps (Ranitidine hcl) .Marland Kitchen.. 1 tab by mouth two times a day 13)  Miralax Powd (Polyethylene glycol 3350) .Marland KitchenMarland KitchenMarland Kitchen 17 grams in 8 once glass of water by mouth daily. dispense 1 month 14)  Colace 100 Mg Caps (Docusate sodium) .Marland Kitchen.. 1 by mouth two times a day for stool softneer 15)  Norco 5-325 Mg Tabs (Hydrocodone-acetaminophen) .Marland Kitchen.. 1 tab by mouth every 4-6 hours as needed pain  Patient Instructions: 1)  Get your xray today.  I will call you with the result.  If it looks ok, you can see me in clinic tomorrow morning for injection. 2)  For pain today: Hydrocodone  1 tab every 4-6 hrs as needed pain.  Prescriptions: NORCO 5-325 MG TABS (HYDROCODONE-ACETAMINOPHEN) 1 tab by mouth every 4-6 hours as needed pain  #30 x 0   Entered  and Authorized by:   [name] MD   Signed by:   [name] MD on 09/28/2010   Method used:   Telephoned to ...       [facility] [address] [phone][facility] (retail)       [address]       [place], [place]  [address]       Ph: [phone] or [phone]       Fax: [phone]   RxID:   [id number]    Orders Added: 1)  Radiology other [Radiology Other] 2)  [facility]- Est Level  3 [[address]]

## 2010-12-13 NOTE — Assessment & Plan Note (Signed)
Summary: MEET NEW DOC/ASSESS FOR DM SUPPLIES & COMPLETE FORM/[facility]   Vital Signs:  Patient profile:   77 year old female Height:      58 inches Weight:      130.8 pounds Pulse rate:   81 / minute BP sitting:   150 / 85  (left arm) Cuff size:   regular  Vitals Entered By: Arlyss Repress CMA, (August 05, 2010 1:34 PM) CC: DM, HTN Is Patient Diabetic? Yes Pain Assessment Patient in pain? no        Primary Care Provider:  Cat Ta MD  CC:  DM and HTN.  History of Present Illness: 77 y/o F her for f/u of   DIABETES Meds: metformin 1000mg  two times a day, glipizide in AM Compliance: yes  Diet: no Lightheadedness: no    Dizziness: no:     Confusion: no    Shakiness: no  Abd  pain: no   Nausea: no     Vomiting: no     CBGs: 120s-130s   HYPERTENSION Disease Monitoring: does not check  Blood pressure range: 150s-160s Medications: Carvedilol 12.5, Norvasc 10, HCTZ 12.5  Compliance: yesLightheadedness: no  Edema: better Chest pain: no  Dyspnea:no Prevention Exercise: no  Salt restriction:no     GERD:  Been taking lots of tums and not working.  At one time she was taking a medicine that she cannot recall the name but her insurance would not cover it so she stopped taking it.  She has feelings of burning sensation in her throat after meals.  She does not eat a lot of spicy foods.  No abnormal weight loss.      Habits & Providers  Alcohol-Tobacco-Diet     Tobacco Status: current     Other Tobacco snuff  Current Medications (verified): 1)  Adult Aspirin Low Strength 81 Mg Chew (Aspirin) .... Take 1 Tablet By Mouth Once A Day 2)  Glipizide 5 Mg Tabs (Glipizide) .Marland Kitchen.. 1 Tab By Mouth in The Morning For Diabetes 3)  Metformin Hcl 1000 Mg Tabs (Metformin Hcl) .Marland Kitchen.. 1 Tab By Mouth Two Times A Day 4)  Zocor 40 Mg Tabs (Simvastatin) .... One Tab By Mouth Daily 5)  Carvedilol 12.5 Mg  Tabs (Carvedilol) .Marland Kitchen.. 1 Pill By Mouth 2 Times Daily 6)  Norvasc 10 Mg  Tabs (Amlodipine Besylate)  .Marland Kitchen.. 1 By Mouth Daily 7)  Hydrochlorothiazide 25 Mg Tabs (Hydrochlorothiazide) .Marland Kitchen.. 1 Tab By Mouth Daily For Blood Pressure 8)  Ventolin Hfa 108 (90 Base) Mcg/act Aers (Albuterol Sulfate) .... 2-4 Puffs Every 4 Hours As Needed For Shortness of Breath or Wheezing. 9)  Flovent Diskus 100 Mcg/blist Aepb (Fluticasone Propionate (Inhal)) .Marland Kitchen.. 1 Puff Inhaled Two Times A Day 10)  Fluticasone Propionate 50 Mcg/act Susp (Fluticasone Propionate) .... 2 Sprays Each Nostril Daily 11)  Plavix 75 Mg Tabs (Clopidogrel Bisulfate) .Marland Kitchen.. 1 Tab By Mouth Daily 12)  Ranitidine Hcl 150 Mg Caps (Ranitidine Hcl) .Marland Kitchen.. 1 Tab By Mouth Two Times A Day  Allergies (verified): 1)  ! * Lsinopril  Past History:  Past Medical History: Last updated: 08/02/2010 Back Pain, known L2 compression fracture Colon cancer, hx of  (s/p resection)  followed by [facility] Congestive heart failure - [facility] at [facility] - last EF 25-35% in May 2008 Coronary artery disease Diabetes mellitus, type II GERD Gastrointestinal hemorrhage, hx of  (Followed by Dr Loreta Ave) Gout Hyperlipidemia Hypertension Anemia-iron deficiency GYN:  Dr Stefano Gaul  Past Surgical History: Last updated: 01/10/2007 cardiolyte- no inducible  ischemia, ef 73% - 08/23/2006, cath- severe 3 vessel disease not CABG candidate - 01/01/2007, EF 20-25% - 01/01/2007, EGD (esoph tear, injected with epi) - 02/07/2000, holter monitor 5/94-sinus rhythm -, LAD, DES for three vessel CAD 1/08 - 01/01/2007, low ant resection for colon CA 1/08 - 12/17/2006, Nl EKG x mild prolonged QT (7/01) - 05/15/2000, Total knee replacement R 8/01 - 10/31/2000  Family History: Last updated: 01/10/2007 sickle cell disease in GC  Social History: Last updated: 05/05/2010 widowed; no smoking or etoh use, dips snuff; retired  Enjoys cooking.  lives with grandson.  Has a daughter, Ahava Kissoon, who helps out.  Another daughter, Labrittany Wechter, is a patient here.  [name] is mother to Ebony Hail and [name] (frequent hospital admits for sickle cell crises).   Risk Factors: Smoking Status: current (08/05/2010) Cans of tobacco/wk: yes (07/12/2007)  Review of Systems       per hpi  Physical Exam  General:  Well-developed,well-nourished,in no acute distress; alert,appropriate and cooperative throughout examination.vitals revriewed.  Mouth:  Oral mucosa and oropharynx without lesions or exudates.  Neck:  NO jvd.  Lungs:  Normal respiratory effort, chest expands symmetrically. Lungs are clear to auscultation, no crackles or wheezes.  Heart:  Normal rate and regular rhythm. S1 and S2 normal without gallop, murmur, click, rub or other extra sounds. Abdomen:  Bowel sounds positive,abdomen soft and non-tender without masses, organomegaly or hernias noted. Extremities:  No clubbing, cyanosis, edema, or deformity noted with normal full range of motion of all joints.   Neurologic:  alert & oriented X3.   Skin:  Intact without suspicious lesions or rashes   Impression & Recommendations:  Problem # 1:  DIABETES MELLITUS II, UNCOMPLICATED (ICD-250.00) Assessment Unchanged A1C 7.1% and at goal. No hypoglycemic numbers on fasting cbgs. will cont current meds. Her updated medication list for this problem includes:    Adult Aspirin Low Strength 81 Mg Chew (Aspirin) .Marland Kitchen... Take 1 tablet by mouth once a day    Glipizide 5 Mg Tabs (Glipizide) .Marland Kitchen... 1 tab by mouth in the morning for diabetes    Metformin Hcl 1000 Mg Tabs (Metformin hcl) .Marland Kitchen... 1 tab by mouth two times a day  Orders: A1C-[facility] ([address]109) [facility]- Est  Level 4 (60454)  Problem # 2:  HYPERTENSION, BENIGN SYSTEMIC (ICD-401.1) Assessment: Deteriorated BP has not been at goal for a long time because of pt's age and increased risk of orthostatis.  I would like to try to decrease BP to get closer to goal of 140/90.  Will increase HCTZ 12.5 to 25.  Will monitor pt closely.  Discussed with pt that she should stop the increase dose if she has any  lightheadedness or fatigue or cannot tolerate it.  Pt to rtc in 3-4 wks.   Her updated medication list for this problem includes:    Carvedilol 12.5 Mg Tabs (Carvedilol) .Marland Kitchen... 1 pill by mouth 2 times daily    Norvasc 10 Mg Tabs (Amlodipine besylate) .Marland Kitchen... 1 by mouth daily    Hydrochlorothiazide 25 Mg Tabs (Hydrochlorothiazide) .Marland Kitchen... 1 tab by mouth daily for blood pressure  Orders: [facility]- Est  Level 4 (09811)  Problem # 3:  GERD (ICD-530.81) Assessment: Deteriorated Was taking protonix at one time but this was stopped, likely due to also taking Plavix.  Since we should not use ppi, will try h2 blocker, Zantac.  Her updated medication list for this problem includes:    Ranitidine Hcl 150 Mg Caps (Ranitidine hcl) .Marland Kitchen... 1 tab by  mouth two times a day  Orders: [facility]- Est  Level 4 (82956)  Complete Medication List: 1)  Adult Aspirin Low Strength 81 Mg Chew (Aspirin) .... Take 1 tablet by mouth once a day 2)  Glipizide 5 Mg Tabs (Glipizide) .Marland Kitchen.. 1 tab by mouth in the morning for diabetes 3)  Metformin Hcl 1000 Mg Tabs (Metformin hcl) .Marland Kitchen.. 1 tab by mouth two times a day 4)  Zocor 40 Mg Tabs (Simvastatin) .... One tab by mouth daily 5)  Carvedilol 12.5 Mg Tabs (Carvedilol) .Marland Kitchen.. 1 pill by mouth 2 times daily 6)  Norvasc 10 Mg Tabs (Amlodipine besylate) .Marland Kitchen.. 1 by mouth daily 7)  Hydrochlorothiazide 25 Mg Tabs (Hydrochlorothiazide) .Marland Kitchen.. 1 tab by mouth daily for blood pressure 8)  Ventolin Hfa 108 (90 Base) Mcg/act Aers (Albuterol sulfate) .... 2-4 puffs every 4 hours as needed for shortness of breath or wheezing. 9)  Flovent Diskus 100 Mcg/blist Aepb (Fluticasone propionate (inhal)) .Marland Kitchen.. 1 puff inhaled two times a day 10)  Fluticasone Propionate 50 Mcg/act Susp (Fluticasone propionate) .... 2 sprays each nostril daily 11)  Plavix 75 Mg Tabs (Clopidogrel bisulfate) .Marland Kitchen.. 1 tab by mouth daily 12)  Ranitidine Hcl 150 Mg Caps (Ranitidine hcl) .Marland Kitchen.. 1 tab by mouth two times a day  Other Orders: Dexa scan  (Dexa scan) Mammogram (Screening) (Mammo)  Patient Instructions: 1)  Please schedule a follow-up appointment in 3-4 weeks for blood pressure. 2)  I want you to increase your Hydrochlorothiazide to 25mg  daily to control your blood pressure better.  If you get tired, short of breath or does not feel well on this dose, please call me right away.  3)  Schedule your mammogram.  Prescriptions: HYDROCHLOROTHIAZIDE 25 MG TABS (HYDROCHLOROTHIAZIDE) 1 tab by mouth daily for blood pressure  #30 x 1   Entered and Authorized by:   [name] MD   Signed by:   [name] MD on 08/05/2010   Method used:   Electronically to        [facility] [address] [phone]* (retail)       [address]       [place], [place]  [address]       Ph: [phone] or [phone]       Fax: [phone]   RxID:   [id number] RANITIDINE HCL 150 MG CAPS (RANITIDINE HCL) 1 tab by mouth two times a day  #60 x 3   Entered and Authorized by:   [name] MD   Signed by:   [name] MD on 08/05/2010   Method used:   [facility]ron[facility] to        [facility] [address] [phone]* (retail)       [address]       [place], [place]  [address]       Ph: [phone] or [phone]       Fax: [phone]   RxID:   [id number] FLUTICASONE PROPIONATE 50 MCG/ACT SUSP (FLUTICASONE PROPIONATE) 2 sprays each nostril daily  #1 x 5   Entered and Authorized by:   [name] MD   Signed by:   [name] MD on 08/05/2010   Method used:   [facility]ron[facility] to        [facility] [address] [phone]* (retail)       [address]       [place]  [place], [place]  [address]       Ph: [phone] or [phone]       Fax: [phone]   RxID:   [id number] NORVASC 10 MG  TABS (AMLODIPINE BESYLATE) 1 by mouth daily  #30 x 6   Entered and Authorized by:   [name] MD   Signed by:   [name] MD on 08/05/2010   Method used:   Electronically to        [facility] [address] [phone]* (retail)       [address]       [place], [place]  [address]       Ph: [phone] or [phone]       Fax: [phone]   RxID:   [id number] CARVEDILOL 12.5 MG  TABS (CARVEDILOL) 1 pill by mouth 2 times daily  #180 x 5   Entered and Authorized by:   [name] MD   Signed by:   [name] MD on 08/05/2010   Method used:   Electronically to        [facility] [address] [phone]* (retail)       [address]       [place], [place]  [address]       Ph: [phone] or [phone]       Fax: [phone]   RxID:   [id number] METFORMIN HCL 1000 MG TABS (METFORMIN HCL) 1 tab by mouth two times a day  #60 x 5   Entered and Authorized by:   [name] MD   Signed by:   [name] MD on 08/05/2010   Method used:   Electronically to        [facility] [address] [phone]* (retail)       [address]       [place], [place]  [address]       Ph: [phone] or [phone]       Fax: [phone]   RxID:   [id number] GLIPIZIDE 5 MG TABS (GLIPIZIDE) 1 tab by mouth in the morning for diabetes  #30 x 12   Entered and Authorized by:   [name] MD   Signed by:   [name] MD on 08/05/2010   Method used:   Electronically to        [facility] [address] [phone]* (retail)       [address]       [place], [place]  [address]       Ph: [phone] or [phone]       Fax: [phone]   RxID:   [id number]   Laboratory Results   Blood Tests   Date/Time Received: August 05, 2010 1:42 PM  Date/Time Reported: August 05, 2010 1:56 PM   HGBA1C: 7.1%   (Normal Range: Non-Diabetic - 3-6%   Control Diabetic - 6-8%)  Comments: ...........test performed by...........Marland KitchenTerese Door, CMA      Prevention & Chronic Care Immunizations   Influenza vaccine: Fluvax MCR  (12/08/2009)   Influenza vaccine due: 08/04/2009    Tetanus booster: 04/14/1999: Done.   Tetanus booster due: 04/13/2009    Pneumococcal vaccine: Done.  (01/11/1997)    Pneumococcal vaccine due: None    H. zoster vaccine: Not documented  Colorectal Screening   Hemoccult: Done.  (09/13/2004)   Hemoccult due:  09/13/2005    Colonoscopy:  Results: Normal. Location:  [facility], Dr Archie Endo Fiber diet    (07/13/2010)   Colonoscopy action/deferral: Repeat colonoscopy in 5 years.   (07/13/2010)   Colonoscopy due: 07/2015  Other Screening   Pap smear: normal  (08/27/2007)   Pap smear action/deferral: Not indicated-other  (08/05/2010)   Pap smear due: 08/26/2008    Mammogram: normal  (03/07/2007)   Mammogram action/deferral: Ordered  (08/05/2010)   Mammogram due: 03/06/2008    DXA bone density scan: Done.  (11/14/2003)   DXA bone density action/deferral: Ordered  (08/05/2010)   DXA scan due: None    Smoking status: current  (08/05/2010)   Smoking cessation counseling: yes  (12/03/2008)  Diabetes Mellitus   HgbA1C: 7.1  (08/05/2010)   Hemoglobin A1C due: 09/30/2008    Eye exam: Not documented    Foot exam: Not documented   High risk foot: Not documented   Foot care education: Not documented    Urine microalbumin/creatinine ratio: Not documented    Diabetes flowsheet reviewed?: Yes   Progress toward A1C goal: At goal  Lipids   Total Cholesterol: Not documented   LDL: 49  (01/26/2007)   LDL Direct: 99  (10/21/2009)   HDL: Not documented   Triglycerides: Not documented    SGOT (AST): 9  (10/21/2009)   SGPT (ALT): <8 U/L  (10/21/2009)   Alkaline phosphatase: 57  (10/21/2009)   Total bilirubin: 0.3  (10/21/2009)    Lipid flowsheet reviewed?: Yes   Progress toward LDL goal: Unchanged  Hypertension   Last Blood Pressure: 150 / 85  (08/05/2010)   Serum creatinine: 0.79  (10/21/2009)   Serum potassium 3.5  (10/21/2009)    Hypertension flowsheet reviewed?: Yes   Progress toward BP goal: Unchanged  Self-Management Support :   Personal Goals (by the next clinic visit) :     Personal A1C goal: 8  (10/21/2009)      Personal blood pressure goal: 140/90  (10/21/2009)     Personal LDL goal: 100  (10/21/2009)    Diabetes self-management support: Not documented    Hypertension self-management support: Not documented    Hypertension self-management support not done because: Good outcomes  (10/21/2009)    Lipid self-management support: Not documented    Nursing Instructions: Schedule screening DXA bone density scan (see order) Schedule screening mammogram (see order)   Appended Document: Syncope Addendum to R1 Admission H&P by R3  cc: lethary, syncope  hpi: 77 y/o F with PMHx  Systolic CHF with EF 25-30 2008, CAD s/p stent, HTN, DM, HLD, GERD who was brough to ER by EMS after "passing out" at home.  History was given by patient and her daugthers.  Daughter states that pt has had diarrhea x 1-2 days.  Today she felt very tired and was walking when she fell down.  She is uncertain if she passed out.  She lives with her son and he was present when she fell but he was unable to tell if she lost consciousness not.  EMS was called and she was brought to [facility] ED.   At the [facility] ED she was found to be orthostatic with Lying 160/57 77, Sitting 154/54  81, Standing 125/47  85.  Of note pt has been to UC for neck pain (per UC report) but per pt she went to [facility] for reflux.   She denies chest pain, dyspnea, fever, chills, abd pain, nausea, vomiting, dysuria, melena, hematemesis  ROS: per hpi  PE:  Vitals  98.6  154/58   70   18   96%RA   CBG 149 Gen: NAD, Appropriate, Cooperative throughout exam HEENT: PERRLA, EOMI, MMM, No nuchal ridgidity or neck mass CVS: distant heart sounds.  RRR, S1S2, No murmurs Resp: good respiratory effort, decreased air movements in lower lobes, no wheezing/crackles Abd: soft, nt/nd, +BS, no masses Ext: no edema, b/l pulses equal Neuro: CN II-XII grossly intact.  Strength 5/5.  Finger to nose, heel to shin, rapid hand movement intact.  +Romberg.  Upon standing, pt is unsteady, with her eyes  closed and standing she swayed and would have fallen.     Labs and Studies: 1. EKG: NSR, HR 77, Nonspecific ST waves, no change from previous 2. POC CE neg 3. CXR: Minor scarring or atelectasis at the left base. No acute  cardiopulmonary abnormality. 4. Chronic small vessel ischemia with mild progression since 2008. No acute intracranial abnormality. 5. UA: moderate Leuk, 25-50 WBC, Many bacterial, few squamous epith 6. CBC: 7.4 > 11.3/35.2 < 247  ANC 6.0, MCV 73.7                                    7. Na 138, K 3.3, Cl 101, CO2 29, Gluc 131, BUN 22, Cr 0.97, Ca 10.98   A/P: 77 year old Female with multiple medical problems with possible syncope earlier today, found to have UTI: 1) Syncope:  Etiolgoy may be orthostasis vs increased dose of HCTZ, ACS, worsening CHF, UTI.  Orthostasis: Will give gentle IVF.  Cont home BP meds with hold parameters.  Currently BP is mildly elevated. Increased dose of HCTZ:  due to increase in HCTZ from 12.5 to 25mg  (done 2 wks ago by PCP).  Will hold HCTZ for now and if it is to be resumed, then start at 12.5mg .  ACS:  Pt has history fo CAD s/p stent, has HTN, DM, HLD so she is at increased risk of ACS.  POC CE was negative and CXR was wnl.  We will continue to rule out ACS with serial CE, repeat EKG in AM. Pt is on telemetry.  Will check FLP and TSH.  A1C was 7.1 on 08/05/10.  Worsening CHF:  Pt not dyspneic, with no edema, no crackles on exam so less likely  CHF exac.  We will repeat Echo to evaluate cardiac function as last Echo was in 12/2006, which showed EF 25-35%.  If EF is worse, it may explain for syncopal episode and pt may need defibrillator/ICD.     2) HTN:  continue Carvedilol, Norvasc.  Will hold HCTZ. Will tolerate BPs in 140s-150s as pt is at more risk of SEs if hypotensive.  We have hold parameters on meds.    3) CAD s/p stents:  will continue plavix and ASA.   4)DM: continue metformin 1000 mg by mouth two times a day, and glipizide 5 mg by mouth  with breakfast. Renal function intact, so will not hold metformin.  5) HLD: Cont Zocor 40.  Check FLP.    6) GERD: Cont Ranitadine two times a day  7) Fall risk:  Pt has fallen at home.  She is on Plavix for CAD.  Will consult PT/OT for deconditioning and Placement advise.    8) Social: Family and Pt requested assessment for possible placement.  They are interested in an Assisted Living Facility or [facility].  Pt may be amendable to  SNF if family agrees.    9) FEN/GI: diabetic diet, heart healthy diet.   Heplock IV.  Will Check BMET in am.   10) Prophylaxis: Heparin 5000 units Subcutaneously three times a day.   11) Code Status:  pt is a full code.   12) Disposition: pending clinical improvement.   Cat Ta MD  August 18, 2010 2:02 AM

## 2010-12-13 NOTE — Miscellaneous (Signed)
Summary: Changing Prob List   Clinical Lists Changes  Problems: Removed problem of MUSCLE WEAKNESS (GENERALIZED) (ICD-728.87) Removed problem of UNSTEADY GAIT (ICD-781.2) Removed problem of ENCOUNTER FOR LONG-TERM USE OF OTHER MEDICATIONS (ICD-V58.69) Removed problem of ALLERGIC RHINITIS, SEASONAL (ICD-477.0) Removed problem of HERNIA, HIATAL, NONCONGENITAL (ICD-553.3) Removed problem of DIVERTICULOSIS OF COLON (ICD-562.10) Removed problem of GERD (ICD-530.81) Removed problem of GASTROINTESTINAL HEMORRHAGE, HX OF (ICD-V12.79) Removed problem of COLON CANCER (ICD-153.9) Removed problem of TOBACCO USER (ICD-305.1) Observations: Added new observation of PAST MED HX: Back Pain, known L2 compression fracture Colon cancer, hx of  (s/p resection)  followed by [facility]olic Congestive heart failure - [facility] at [facility] - last EF 25-35% in May 2008 Coronary artery disease Diabetes mellitus, type II GERD Gastrointestinal hemorrhage, hx of  (Followed by Dr Loreta Ave) Gout Hyperlipidemia Hypertension Anemia-iron deficiency GYN:  Dr Stefano Gaul History of GI bleed Diverticulosis Seasonal allergic rhinitis   (09/21/2010 21:14) Added new observation of PRIMARY MD: Cat Ta MD (09/21/2010 21:14)       Past History:  Past Medical History: Back Pain, known L2 compression fracture Colon cancer, hx of  (s/p resection)  followed by Odogwu Systolic Congestive heart failure - [facility] at [facility] - last EF 25-35% in May 2008 Coronary artery disease Diabetes mellitus, type II GERD Gastrointestinal hemorrhage, hx of  (Followed by Dr Loreta Ave) Gout Hyperlipidemia Hypertension Anemia-iron deficiency GYN:  Dr Stefano Gaul History of GI bleed Diverticulosis Seasonal allergic rhinitis

## 2010-12-13 NOTE — Assessment & Plan Note (Signed)
Summary: geri clinic and PATIENT SUMMARY   Vital Signs:  Patient profile:   77 year old female Height:      58 inches Weight:      126 pounds BMI:     26.43 Temp:     98.4 degrees F oral Pulse rate:   75 / minute BP sitting:   148 / 75  (left arm) Cuff size:   regular  Vitals Entered By: Tessie Fass CMA (May 05, 2010 11:15 AM) CC: F/U Is Patient Diabetic? Yes Pain Assessment Patient in pain? no        Primary Care Provider:  Ardeen Garland  MD  CC:  F/U.  History of Present Illness: [name] comes today to see PCP in [facility] for geriatric assessment.  SHe has no complaints today.  THinks she is doing very well. Note is documented in extra detail to serve as a patient summary for next primary provider.  Lives with her 12 yo grandson, [name], who doesn't work and is "the Dispensing optician boy you'll ever meet".  Doesn't really help her out at all around the house. She gets her income from her husband's social security. She does have trouble making it stretch every month. Her son who passed away this Spring helped her financially.  Her other children have not been helping much financially since he passed. Her daughter, [name], helps her with bathing and also drives her where she needs to go. She is otherwise essentially independent with her ADLs and iADLs.  She feels she gets along very well for her age.  Does not feel like she has too much trouble with her memory.  Does not fall.   Her sugars are running "good".  No hypoglycemic events.  Highest 150 HTN - tolerating her medications.  No orthostatic symptoms. Anemia - stopped taking her iron because she didn't like it.     Habits & Providers  Alcohol-Tobacco-Diet     Tobacco Status: current     Tobacco Counseling: to quit use of tobacco products     Other Tobacco snuff     Other per week 7  Current Medications (verified): 1)  Adult Aspirin Low Strength 81 Mg Chew (Aspirin) .... Take 1 Tablet By Mouth Once A Day 2)   Glipizide 5 Mg Tabs (Glipizide) .Marland Kitchen.. 1 Tab By Mouth in The Morning For Diabetes 3)  Metformin Hcl 1000 Mg Tabs (Metformin Hcl) .Marland Kitchen.. 1 Tab By Mouth Two Times A Day 4)  Zocor 40 Mg Tabs (Simvastatin) .... One Tab By Mouth Daily 5)  Carvedilol 12.5 Mg  Tabs (Carvedilol) .Marland Kitchen.. 1 Pill By Mouth 2 Times Daily 6)  Norvasc 10 Mg  Tabs (Amlodipine Besylate) .Marland Kitchen.. 1 By Mouth Daily 7)  Hydrochlorothiazide 12.5 Mg Caps (Hydrochlorothiazide) .Marland Kitchen.. 1 Tab By Mouth Daily 8)  Ventolin Hfa 108 (90 Base) Mcg/act Aers (Albuterol Sulfate) .... 2-4 Puffs Every 4 Hours As Needed For Shortness of Breath or Wheezing. 9)  Flovent Diskus 100 Mcg/blist Aepb (Fluticasone Propionate (Inhal)) .Marland Kitchen.. 1 Puff Inhaled Two Times A Day 10)  Fluticasone Propionate 50 Mcg/act Susp (Fluticasone Propionate) .... 2 Sprays Each Nostril Daily 11)  Plavix 75 Mg Tabs (Clopidogrel Bisulfate) .Marland Kitchen.. 1 Tab By Mouth Daily  Allergies: 1)  ! * Lsinopril  Social History: widowed; no smoking or etoh use, dips snuff; retired  Enjoys cooking.  lives with grandson.  Has a daughter, [name], who helps out.  Another daughter, [name], is a patient here.  [name] is mother to  [name] and [name] (frequent hospital admits for sickle cell crises).   Physical Exam  General:  alert, well-developed, well-nourished, and well-hydrated.  NAD, cooperative to exam vitals reviewed  Eyes:  conjunctiva clear, no injection Lungs:  normal WOB, clear to ausc bilaterally Heart:  RRR without murmur Abdomen:  Bowel sounds positive,abdomen soft and non-tender without masses, organomegaly or hernias noted. Pulses:  2+ radial Extremities:  no edema Psych:  Oriented X3, memory intact for recent and remote, normally interactive, good eye contact, not anxious appearing, and not depressed appearing.     Impression & Recommendations:  Problem # 1:  DIABETES MELLITUS II, UNCOMPLICATED (ICD-250.00)  At goal.  Doing well.  Next check due next month.  Her updated  medication list for this problem includes:    Adult Aspirin Low Strength 81 Mg Chew (Aspirin) .Marland Kitchen... Take 1 tablet by mouth once a day    Glipizide 5 Mg Tabs (Glipizide) .Marland Kitchen... 1 tab by mouth in the morning for diabetes    Metformin Hcl 1000 Mg Tabs (Metformin hcl) .Marland Kitchen... 1 tab by mouth two times a day  Orders: [facility]- Est  Level 4 (16109)  Problem # 2:  HYPERTENSION, BENIGN SYSTEMIC (ICD-401.1)  Often above goal, but at a value tolerable for her age to prevent orthostasis, etc.   Her updated medication list for this problem includes:    Carvedilol 12.5 Mg Tabs (Carvedilol) .Marland Kitchen... 1 pill by mouth 2 times daily    Norvasc 10 Mg Tabs (Amlodipine besylate) .Marland Kitchen... 1 by mouth daily    Hydrochlorothiazide 12.5 Mg Caps (Hydrochlorothiazide) .Marland Kitchen... 1 tab by mouth daily  Orders: [facility]- Est  Level 4 (60454)  Problem # 3:  ANEMIA-IRON DEFICIENCY (ICD-280.9)  H/O colon cancer.  Is not taking the iron because she didn't like it.  The following medications were removed from the medication list:    Ferrous Sulfate 325 (65 Fe) Mg Tbec (Ferrous sulfate) .Marland Kitchen..Marland Kitchen Two times a day  Orders: [facility]- Est  Level 4 (09811)  Problem # 4:  CORONARY, ARTERIOSCLEROSIS (ICD-414.00) Had stent placed in 2007.  On plavix by Dr. Amil Amen.  The following medications were removed from the medication list:    Plavix 75 Mg Tabs (Clopidogrel bisulfate) Her updated medication list for this problem includes:    Adult Aspirin Low Strength 81 Mg Chew (Aspirin) .Marland Kitchen... Take 1 tablet by mouth once a day    Carvedilol 12.5 Mg Tabs (Carvedilol) .Marland Kitchen... 1 pill by mouth 2 times daily    Norvasc 10 Mg Tabs (Amlodipine besylate) .Marland Kitchen... 1 by mouth daily    Hydrochlorothiazide 12.5 Mg Caps (Hydrochlorothiazide) .Marland Kitchen... 1 tab by mouth daily    Plavix 75 Mg Tabs (Clopidogrel bisulfate) .Marland Kitchen... 1 tab by mouth daily  Problem # 5:  COLON CANCER (ICD-153.9) Next colonoscopy due 11/11  Problem # 6:  ALLERGIC RHINITIS, SEASONAL (ICD-477.0) Is actually her most  bothersome health problem.  Medicare won't pay for loratadine or cetirizine and she can't afford to buy it OTC.  Has gotten significant relief from flonase.   Problem # 7:  ARTHRITIS (ICD-716.90) Uses 4 prong cane to walk when seh leaves the house.  Uses tylenol as needed.  She scores as mild dementia on MMSE, but mostly because no points for serial 7s/world backwards.  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  IF you discount that, she scores really  well.  Not depressed. Good mobility and balance. Has been osteopenic in the past but do to reflux esophagitis, has not tolerated bisphosphonates.   Complete Medication List: 1)  Adult Aspirin Low Strength 81 Mg Chew (Aspirin) .... Take 1 tablet by mouth once a day 2)  Glipizide 5 Mg Tabs (Glipizide) .Marland Kitchen.. 1 tab by mouth in the morning for diabetes 3)  Metformin Hcl 1000 Mg Tabs (Metformin hcl) .Marland Kitchen.. 1 tab by mouth two times a day 4)  Zocor 40 Mg Tabs (Simvastatin) .... One tab by mouth daily 5)  Carvedilol 12.5 Mg Tabs (Carvedilol) .Marland Kitchen.. 1 pill by mouth 2 times daily 6)  Norvasc 10 Mg Tabs (Amlodipine besylate) .Marland Kitchen.. 1 by mouth daily 7)  Hydrochlorothiazide 12.5 Mg Caps (Hydrochlorothiazide) .Marland Kitchen.. 1 tab by mouth daily 8)  Ventolin Hfa 108 (90 Base) Mcg/act Aers (Albuterol sulfate) .... 2-4 puffs every 4 hours as needed for shortness of breath or wheezing. 9)  Flovent Diskus 100 Mcg/blist Aepb (Fluticasone propionate (inhal)) .Marland Kitchen.. 1 puff inhaled two times a day 10)  Fluticasone Propionate 50 Mcg/act Susp (Fluticasone propionate) .... 2 sprays each nostril daily 11)  Plavix 75 Mg Tabs (Clopidogrel bisulfate) .Marland Kitchen.. 1 tab by mouth daily   Geriatric Assessment:  Activities of Daily Living:    Bathing-assisted    Dressing-independent    Eating-independent    Toileting-independent    Transferring-independent     Continence-independent  Instrumental Activities of Daily Living:    Transportation-dependent    Meal/Food Preparation-independent    Shopping Errands-assisted    Housekeeping/Chores-independent    Money Management/Finances-independent    Medication Management-independent    Ability to Use Telephone-independent    Laundry-independent    Daughter, [name], drives her to store but she otherwise shops on her own.  [name] helps her bathe.  Occassionally family helps her clean house but she can do it herself too.   Mental Status Exam: (value/max value)    Orientation to Time: 5/5    Orientation to Place: 5/5    Registration: 3/3    Attention/Calculation: 0/5    Recall: 2/3    Language-name 2 objects: 2/2    Language-repeat: 1/1    Language-follow 3-step command: 3/3    Language-read and follow direction: 1/1    Write a sentence: 1/1    Copy design: 0/1    Only 3rd grade education - possibly affecting attention/calculation MSE Total score: 23/30  Balance: (value/max value)    Sitting balance: 1/1    Arises: 2/2    Attempts to arise: 2/2    Immediate standing balance: 2/2    Standing balance: 1/1    Nudged: 2/2    Eyes closed: 1/1    360 degree turn: 0/1    Sitting down: 2/2 Balance Total Score: 13/14  Gait: (value/max value)    Initiation of gait: 1/1 Gait Total Score: 1/12  Balance + Gait Total Score: 14/26

## 2010-12-13 NOTE — Progress Notes (Signed)
Summary: phn msg  Phone Note Call from Patient Call back at Home Phone [phone]   Caller: Patient Summary of Call: states she has cancer - no one has gotten in touch with her about surgery or treatment Initial call taken by: De Nurse,  October 13, 2010 9:18 AM  Follow-up for Phone Call        Called pt back.  Spoke with pt's son.  Pt is currently at [facility] because she became dizzy.   Follow-up by: [name] MD,  October 13, 2010 3:03 PM

## 2010-12-13 NOTE — Miscellaneous (Signed)
Summary: Changing dx of asthma  Clinical Lists Changes  Problems: Changed problem from ASTHMA, UNSPECIFIED (ICD-493.90) to ASTHMA, PERSISTENT (ICD-493.90) Removed problem of SYNCOPE, HX OF (ICD-V12.49) Removed problem of UTI (ICD-599.0)

## 2010-12-13 NOTE — Progress Notes (Signed)
  Phone Note Call from Patient   Summary of Call: Patient's daughter called to say patient's son died today and needs some medicine to calm her down.  Explained that I cannot prescribe meds over the phone.  No sedating meds on her MAR.  Advised typical calming techniques, advised on normalcy of grief.  No SI.  May try benadryl short term for sleep.  No history of dementia.  Advised to follow-up with PCP if continues to have trouble.   Initial call taken by: Delbert Harness MD,  January 12, 2010 8:07 PM

## 2010-12-13 NOTE — Letter (Signed)
Summary: Independent assessment for PCS  Independent assessment for PCS   Imported By: De Nurse 11/29/2009 10:42:12  _____________________________________________________________________  External Attachment:    Type:   Image     Comment:   External Document

## 2010-12-13 NOTE — Consult Note (Signed)
Summary: [facility]  [facility]   Imported By: Clydell Hakim 07/21/2010 16:10:55  _____________________________________________________________________  External Attachment:    Type:   Image     Comment:   External Document  Appended Document: [facility]   Colonoscopy  Procedure date:  07/13/2010  Findings:       Results: Normal. Location:  [facility], Dr Archie Endo Fiber diet    Comments:      Repeat colonoscopy in 5 years.   Procedures Next Due Date:    Colonoscopy: 07/2015

## 2010-12-13 NOTE — Miscellaneous (Signed)
Summary: abnormal labs  Clinical Lists Changes abnormal labs by fax from [place]. to pcp.Golden Circle RN  February 04, 2010 9:31 AM  Observations: Added new observation of FERRITIN: 396 ng/mL (02/03/2010 9:30) Added new observation of IRON: 56 mcg/dL (45/40/9811 9:14) Added new observation of PLATELETK/UL: 264 K/uL (02/03/2010 9:30) Added new observation of MCV: 70.3 fL (02/03/2010 9:30) Added new observation of HCT: 32.7 % (02/03/2010 9:30) Added new observation of HGB: 10.2 g/dL (78/29/5621 3:08) Added new observation of WBC COUNT: 4.1 10*3/microliter (02/03/2010 9:30)

## 2010-12-13 NOTE — Assessment & Plan Note (Signed)
Summary: Hip injection    Vital Signs:  Patient profile:   77 year old female Height:      58 inches Weight:      125 pounds BMI:     26.22 Temp:     98.1 degrees F oral Pulse rate:   75 / minute BP sitting:   129 / 60  (right arm) Cuff size:   regular  Vitals Entered By: Tessie Fass CMA (September 29, 2010 10:38 AM) CC: F/U   Primary Care Provider:  Jonet Mathies MD  CC:  F/U.  History of Present Illness: 77 y/o F here for R greater trochanteric bursa injection.    Allergies: 1)  ! * Lsinopril  Physical Exam  General:  Well-developed,well-nourished,in no acute distress; alert,appropriate and cooperative throughout examination. vitals reviewed.  Msk:  Consent obtained and verified. Sterile betadine prep. Furthur cleansed with alcohol. Topical analgesic spray: Ethyl chloride. Joint: R greater trochanteric bursa Approached in typical fashion with: Completed without difficulty Meds: 1cc kenolog 40, 4cc marcaine1% Needle: 2 1/2 inch, 22 gauge Aftercare instructions and Red flags advised.     Impression & Recommendations:  Problem # 1:  HIP PAIN, RIGHT (ICD-719.45) Assessment New Injection given with kenolog.  Pt can take norco tonight if needed for pain.  Her updated medication list for this problem includes:    Adult Aspirin Low Strength 81 Mg Chew (Aspirin) .Marland Kitchen... Take 1 tablet by mouth once a day    Norco 5-325 Mg Tabs (Hydrocodone-acetaminophen) .Marland Kitchen... 1 tab by mouth every 4-6 hours as needed pain  Orders: [facility]- Est Level  3 (56213) Injection, intermediate joint - [facility] (08657)  Complete Medication List: 1)  Adult Aspirin Low Strength 81 Mg Chew (Aspirin) .... Take 1 tablet by mouth once a day 2)  Glipizide 5 Mg Tabs (Glipizide) .Marland Kitchen.. 1 tab by mouth in the morning for diabetes 3)  Metformin Hcl 1000 Mg Tabs (Metformin hcl) .Marland Kitchen.. 1 tab by mouth two times a day 4)  Crestor 20 Mg Tabs (Rosuvastatin calcium) .Marland Kitchen.. 1 tab by mouth at bedtime for cholesterol 5)  Carvedilol  12.5 Mg Tabs (Carvedilol) .Marland Kitchen.. 1 pill by mouth 2 times daily 6)  Norvasc 10 Mg Tabs (Amlodipine besylate) .Marland Kitchen.. 1 by mouth daily 7)  Hydrochlorothiazide 25 Mg Tabs (Hydrochlorothiazide) .Marland Kitchen.. 1 tab by mouth daily for blood pressure 8)  Ventolin Hfa 108 (90 Base) Mcg/act Aers (Albuterol sulfate) .... 2-4 puffs every 4 hours as needed for shortness of breath or wheezing. 9)  Flovent Diskus 100 Mcg/blist Aepb (Fluticasone propionate (inhal)) .Marland Kitchen.. 1 puff inhaled two times a day 10)  Fluticasone Propionate 50 Mcg/act Susp (Fluticasone propionate) .... 2 sprays each nostril daily 11)  Plavix 75 Mg Tabs (Clopidogrel bisulfate) .Marland Kitchen.. 1 tab by mouth daily 12)  Ranitidine Hcl 150 Mg Caps (Ranitidine hcl) .Marland Kitchen.. 1 tab by mouth two times a day 13)  Miralax Powd (Polyethylene glycol 3350) .Marland KitchenMarland KitchenMarland Kitchen 17 grams in 8 once glass of water by mouth daily. dispense 1 month 14)  Colace 100 Mg Caps (Docusate sodium) .Marland Kitchen.. 1 by mouth two times a day for stool softneer 15)  Norco 5-325 Mg Tabs (Hydrocodone-acetaminophen) .Marland Kitchen.. 1 tab by mouth every 4-6 hours as needed pain   Orders Added: 1)  [facility]- Est Level  3 [84696] 2)  Injection, intermediate joint - [facility] [20605]

## 2010-12-13 NOTE — Miscellaneous (Signed)
Summary: Hosp admission labs  Clinical Lists Changes  Observations: Added new observation of PAST SURG HX: -CTAngio head/neck 08/18/10: severe stenosis of prox L vertebral A, >50% stenosis prox R vertebral A, severe stenosis of distal L vertebral A.  Neuro states medical management, not surgery candidate -C-spine 08/18/10: spondylosis C4-C5, C5-C6 -CT head 08/17/10: Chronic small vessel ischemia with mild progression since 2008 -cardiolyte- no inducible ischemia, ef 73% - 08/23/2006,  -cath- severe 3 vessel disease not CABG candidate - 01/01/2007,  -EF 20-25% - 01/01/2007,  -EGD (esoph tear, injected with epi) - 02/07/2000, -H -Holter monitor 5/94-sinus rhythm -,  -LAD, DES for three vessel CAD 1/08 - 01/01/2007, -low ant resection for colon CA 1/08 - 12/17/2006,  -Nl EKG x mild prolonged QT (7/01) - 05/15/2000,  -Total knee replacement R 8/01 - 10/31/2000 (08/22/2010 8:44) Added new observation of PRIMARY MD: Cat Ta MD (08/22/2010 8:44) Added new observation of TSH: 2.042 microintl units/mL (08/18/2010 8:44) Added new observation of CHOL/HDL: 3.3  (08/18/2010 8:44) Added new observation of VLDL: 16 mg/dL (16/08/9603 5:40) Added new observation of LDL: 67 mg/dL (98/09/9146 8:29) Added new observation of HDL: 36 mg/dL (56/21/3086 5:78) Added new observation of TRIGLYC TOT: 79 mg/dL (46/96/2952 8:41) Added new observation of CHOLESTEROL: 119 mg/dL (32/44/0102 7:25)       Past History:  Past Surgical History: -CTAngio head/neck 08/18/10: severe stenosis of prox L vertebral A, >50% stenosis prox R vertebral A, severe stenosis of distal L vertebral A.  Neuro states medical management, not surgery candidate -C-spine 08/18/10: spondylosis C4-C5, C5-C6 -CT head 08/17/10: Chronic small vessel ischemia with mild progression since 2008 -cardiolyte- no inducible ischemia, ef 73% - 08/23/2006,  -cath- severe 3 vessel disease not CABG candidate - 01/01/2007,  -EF 20-25% - 01/01/2007,  -EGD (esoph tear,  injected with epi) - 02/07/2000, -H -Holter monitor 5/94-sinus rhythm -,  -LAD, DES for three vessel CAD 1/08 - 01/01/2007, -low ant resection for colon CA 1/08 - 12/17/2006,  -Nl EKG x mild prolonged QT (7/01) - 05/15/2000,  -Total knee replacement R 8/01 - 10/31/2000

## 2010-12-13 NOTE — Miscellaneous (Signed)
Summary: Changing CHF to Systolic CHF  Clinical Lists Changes  Problems: Changed problem from CHF - EJECTION FRACTION < 50% (ICD-428.22) to CHRONIC SYSTOLIC HEART FAILURE (ICD-428.22) Observations: Added new observation of PAST MED HX: Back Pain, known L2 compression fracture Colon cancer, hx of  (s/p resection)  followed by [facility] Systolic Congestive heart failure - [facility] - last EF 25-35% in May 2008 Coronary artery disease Diabetes mellitus, type II GERD Gastrointestinal hemorrhage, hx of  (Followed by Dr Loreta Ave) Gout Hyperlipidemia Hypertension Anemia-iron deficiency GYN:  Dr Stefano Gaul  (08/02/2010 10:26) Added new observation of PRIMARY MD: Ardeen Garland  MD (08/02/2010 10:26)       Past History:  Past Medical History: Back Pain, known L2 compression fracture Colon cancer, hx of  (s/p resection)  followed by [facility] Systolic Congestive heart failure - [facility] - last EF 25-35% in May 2008 Coronary artery disease Diabetes mellitus, type II GERD Gastrointestinal hemorrhage, hx of  (Followed by Dr Loreta Ave) Gout Hyperlipidemia Hypertension Anemia-iron deficiency GYN:  Dr Stefano Gaul

## 2010-12-13 NOTE — Assessment & Plan Note (Signed)
Summary: HOME VISIT PER TA/KH   Vital Signs:  Patient profile:   77 year old female BP sitting:   145 / 86  (right arm)  Primary Care Provider:  Cat Ta MD  CC:  Home visit .  History of Present Illness: 77 y/o F with systolic CHF, DM, HTN, HLD who was recently hospitalized in Oct for syncope episode.  At that time work up showed UTI, vertebral insufficiency, diffuse atherosclerosis of neck vessel.  [facility] was consulted and they recommended optimal medical management.  PT was also consulted after family was concerned for her safety to live at home.  PT evaluated pt and thougth she was fit to continue living in her home.  We are at pt's home today to evaluate her safety and monitor her blood pressure.     MOBILITY: She uses a walker to help her move around the house.  There are a couple of steps leading to the front door.  Pt uses the railing for balance to get to the front door, but family statest that she never walks out/into the house by herself.    LIVINGROOM: Pt spends most of her time in the livingroom.  There is   TOILETING: She uses a bedside commode at night and the hall bathroom during the day.  She has a shower chair and bathtub grab bar, but need help to get into the tub. She walks in the house using quad cane. Two steps at the front entrance with a handrail.   Allergies: 1)  ! * Lsinopril   Impression & Recommendations:  Problem # 1:  VERTEBROBASILAR ARTERY SYNDROME (ICD-435.3)  Her updated medication list for this problem includes:    Adult Aspirin Low Strength 81 Mg Chew (Aspirin) .Marland Kitchen... Take 1 tablet by mouth once a day    Plavix 75 Mg Tabs (Clopidogrel bisulfate) .Marland Kitchen... 1 tab by mouth daily  Orders: Provider Misc Charge- [facility] (Misc)  Problem # 2:  HYPERTENSION, BENIGN SYSTEMIC (ICD-401.1)  Her updated medication list for this problem includes:    Carvedilol 12.5 Mg Tabs (Carvedilol) .Marland Kitchen... 1 pill by mouth 2 times daily    Norvasc 10 Mg Tabs (Amlodipine besylate)  .Marland Kitchen... 1 by mouth daily    Hydrochlorothiazide 25 Mg Tabs (Hydrochlorothiazide) .Marland Kitchen... 1 tab by mouth daily for blood pressure  Orders: Provider Misc Charge- [facility] (Misc)  Problem # 3:  UNSTEADY GAIT (ICD-781.2)  Continue using quad cane in right hand. Will prescription raised toilet seat and new shower chair. Family will address reliable hand holds in the bathroom.   Orders: Provider Misc Charge- [facility] (Misc)  Complete Medication List: 1)  Adult Aspirin Low Strength 81 Mg Chew (Aspirin) .... Take 1 tablet by mouth once a day 2)  Glipizide 5 Mg Tabs (Glipizide) .Marland Kitchen.. 1 tab by mouth in the morning for diabetes 3)  Metformin Hcl 1000 Mg Tabs (Metformin hcl) .Marland Kitchen.. 1 tab by mouth two times a day 4)  Zocor 40 Mg Tabs (Simvastatin) .... One tab by mouth daily 5)  Carvedilol 12.5 Mg Tabs (Carvedilol) .Marland Kitchen.. 1 pill by mouth 2 times daily 6)  Norvasc 10 Mg Tabs (Amlodipine besylate) .Marland Kitchen.. 1 by mouth daily 7)  Hydrochlorothiazide 25 Mg Tabs (Hydrochlorothiazide) .Marland Kitchen.. 1 tab by mouth daily for blood pressure 8)  Ventolin Hfa 108 (90 Base) Mcg/act Aers (Albuterol sulfate) .... 2-4 puffs every 4 hours as needed for shortness of breath or wheezing. 9)  Flovent Diskus 100 Mcg/blist Aepb (Fluticasone propionate (inhal)) .Marland Kitchen.. 1 puff inhaled  two times a day 10)  Fluticasone Propionate 50 Mcg/act Susp (Fluticasone propionate) .... 2 sprays each nostril daily 11)  Plavix 75 Mg Tabs (Clopidogrel bisulfate) .Marland Kitchen.. 1 tab by mouth daily 12)  Ranitidine Hcl 150 Mg Caps (Ranitidine hcl) .Marland Kitchen.. 1 tab by mouth two times a day 13)  Miralax Powd (Polyethylene glycol 3350) .Marland KitchenMarland KitchenMarland Kitchen 17 grams in 8 once glass of water by mouth daily. dispense 1 month 14)  Colace 100 Mg Caps (Docusate sodium) .Marland Kitchen.. 1 by mouth two times a day for stool softneer 15)  Raised Toilet Seat Misc (Misc. devices) .... Use as directed dx: generalized weakness  dx: unsteady gait 16)  Hugo Technical sales engineer (Misc. devices) .... Use as directed dx: unsteady gait  dx: weakness generalized Prescriptions: HUGO ROLLING WALKER BASIC  MISC (MISC. DEVICES) Use as directed Dx: Unsteady gait Dx: Weakness generalized  #1 x 0   Entered and Authorized by:   [name] MD   Signed by:   [name] MD on 09/08/2010   Method used:   Printed then faxed [facility] ...       [facility] [address] [phone]* (retail)       [address]       [place], [place]  [address]       Ph: [phone] or [phone]       Fax: [phone]   RxID:   [id number] RAISED TOILET SEAT  MISC (MISC. DEVICES) Use as directed Dx: generalized weakness  Dx: unsteady gait  #1 x 0   Entered and Authorized by:   [name] MD   Signed by:   [name] MD on 09/08/2010   Method used:   Printed then faxed to ...       [facility] [address] [phone]* (retail)       [address]       [place], [place]  [address]       Ph: [phone] or [phone]       Fax: [phone]   RxID:   [id number] GLIPIZIDE 5 MG TABS (GLIPIZIDE) 1 tab by mouth in the morning for diabetes  #30 Tablet x 4   Entered by:   [name] MD   Authorized by:   Zachery Dauer MD   Signed by:   [name] MD on 09/08/2010   Method used:   Electronically to        [facility] [address] [phone]* (retail)       [address]       [place], [place]  [address]       Ph: [phone] or [phone]       Fax: [phone]   RxID:   [id number] COLACE 100 MG CAPS (DOCUSATE SODIUM) 1 by mouth two times a day for stool softneer  #60 x 3   Entered by:   [name] MD   Authorized by:   Zachery Dauer MD   Signed by:   [name] MD on 09/08/2010   Method used:   Electronically to        [facility] [address] [phone]* (retail)       [address]       [place], [place]  [address]       Ph: [phone] or [phone]  Fax: [phone]   RxID:   [id number] MIRALAX  POWD (POLYETHYLENE GLYCOL 3350) 17 grams in 8 once glass of water by mouth daily. Dispense 1 month   #1 x 3   Entered by:   Cat Ta MD   Authorized by:   Zachery Dauer MD   Signed by:   [name] MD on 09/08/2010   Method used:   Electronically to        [facility] [address] [phone]* (retail)       [address]       [place], [place]  [address]       Ph: [phone] or [phone]       Fax: [phone]   RxID:   [id number]  .I interviewed and examined this patient and discussed her home care plan with Dr. Janalyn Harder.   Orders Added: 1)  Provider Misc Charge- [facility] [Misc]

## 2010-12-13 NOTE — Miscellaneous (Signed)
Summary: Orders Update  Clinical Lists Changes  Problems: Added new problem of ENCOUNTER FOR LONG-TERM USE OF OTHER MEDICATIONS (ICD-V58.69) Medications: Changed medication from ZOCOR 40 MG TABS (SIMVASTATIN) One tab by mouth daily to CRESTOR 20 MG TABS (ROSUVASTATIN CALCIUM) 1 tab by mouth at bedtime for cholesterol - Signed Rx of CRESTOR 20 MG TABS (ROSUVASTATIN CALCIUM) 1 tab by mouth at bedtime for cholesterol;  #30 x 6;  Signed;  Entered by: Christian Mate D;  Authorized by: Madelon Lips Pharm D;  Method used: Historical Orders: Added new Test order of B12-[facility] [phone]) - Signed Added new Test order of CBC-[facility] (09811) - Signed  OK per Dr. [name]  Prescriptions: CRESTOR 20 MG TABS (ROSUVASTATIN CALCIUM) 1 tab by mouth at bedtime for cholesterol  #30 x 6   Entered and Authorized by:   Christian Mate D   Signed by:   Madelon Lips Pharm D on 09/14/2010   Method used:   Historical   RxID:   [id number]

## 2010-12-13 NOTE — Miscellaneous (Signed)
Summary: Home Care Form  Form dropped off to be filled out for home care.  Please fax form when completed. Bradly Bienenstock  November 24, 2009 3:48 PM  form to pcp.Golden Circle RN  November 24, 2009 4:52 PM  completed and placed in to be faxed box. Ardeen Garland  MD  November 25, 2009 11:25 AM

## 2010-12-13 NOTE — Progress Notes (Signed)
Summary: triage  Phone Note Call from Patient Call back at Home Phone [phone]   Caller: Patient Summary of Call: has a cough and wants cough syrup Initial call taken by: De Nurse,  August 23, 2010 1:39 PM  Follow-up for Phone Call        states she usually uses the sugar free robitussin & wants to know if she can use it. told her yes. also wanted to know if people can live with blood vessels partially blocked. told her yes. the body may even grow collateral veins to help she had no further questions Follow-up by: Golden Circle RN,  August 23, 2010 1:43 PM  Additional Follow-up for Phone Call Additional follow up Details #1::        Called pt back.  She bought Diabetic Tussin for cough.  She denied fever, chills, nausea, vomiting, abd pain, headache.   Told her Diabetic Tussin was safe, but that she should come in to see [place] if still coughing next week.  She agreed.  Additional Follow-up by: Cat Ta MD,  August 23, 2010 5:50 PM

## 2010-12-13 NOTE — Progress Notes (Signed)
Summary: triage  Phone Note Call from Patient Call back at [phone] ext [phone]   Caller: Daughter-Lisa Caldwell Summary of Call: Juat lost her son and daughter wondering if valuim could be called in for her just for temperary while she comes to terms with this.  She is not sleeping well.   Initial call taken by: Clydell Hakim,  January 26, 2010 3:18 PM  Follow-up for Phone Call        wants valium for pt. her son just died. (dtr is calling for her mom) told her i will send this to pcp who is scheduled to be here at 8:30am. told her she mayy read the message sooner. if so, [facility] [address] Follow-up by: Golden Circle RN,  January 26, 2010 3:28 PM  Additional Follow-up for Phone Call Additional follow up Details #1::        Will give short Rx for this.  Patient needs to be watched (not left alone) while taking this as can lead to falls.  Would like to see her in next few weeks to assess how she is doing.  Rx printed nad placed in to be faxed box up front as cannot be e-prescribed or called in.  Additional Follow-up by: Lamar Laundry, MD March 17th, 2011 08:50AM    New/Updated Medications: VALIUM 5 MG TABS (DIAZEPAM) 1 tablet two times a day as needed anxiety. Prescriptions: VALIUM 5 MG TABS (DIAZEPAM) 1 tablet two times a day as needed anxiety.  #40 x 0   Entered and Authorized by:   Ardeen Garland  MD   Signed by:   Ardeen Garland  MD on 01/27/2010   Method used:   Printed then faxed to ...       [facility] [address] [phone]* (retail)       [address]       [place], [place]  [address]       Ph: [phone] or [phone]       Fax: [phone]   RxID:   [id number] VALIUM 5 MG TABS (DIAZEPAM) 1 tablet two times a day as needed anxiety.  #40 x 0   Entered and Authorized by:   Ardeen Garland  MD   Signed by:   Ardeen Garland  MD on 01/27/2010   Method used:   Printed then faxed to ...       [facility] [address] [phone]* (retail)       [address]       [place], [place]  [address]       Ph: [phone] or [phone]       Fax: [phone]   RxID:   [id number]  called dtr. she will come get it & make appt.Golden Circle RN  January 27, 2010 8:59 AM

## 2010-12-13 NOTE — Consult Note (Signed)
Summary: [facility]  [facility]   Imported By: Clydell Hakim 04/22/2010 15:13:14  _____________________________________________________________________  External Attachment:    Type:   Image     Comment:   External Document

## 2010-12-13 NOTE — Progress Notes (Signed)
Summary: re: appt with [facility]  Phone Note Call from Patient Call back at [phone]   Caller: Aid-Melanie Summary of Call: went to ED about her knee and they referred her to go to [facility] - has appt this Thurs there and needs referral from [place] and [place] auth Initial call taken by: De Nurse,  July 26, 2010 10:24 AM  Follow-up for Phone Call        forwarded to pcp Follow-up by: Loralee Pacas CMA,  July 26, 2010 11:41 AM    I am not aware of her knee issue.  She has not been seen at [facility] for this.  According to previous PCP, Dr Georgiana Shore, pt has arthritis, but not specific to knee.  I do not know what I am referring her for.  Cat Ta MD  July 26, 2010 12:28 PM CALLED [facility]. SPOKE WITH [name] AND GIVEN OUR NPI # FOR PT TO BE SEEN for her appt on Thursday. (referred by the ED) called pt's home and informed.Arlyss Repress CMA,  July 26, 2010 2:52 PM

## 2010-12-13 NOTE — Progress Notes (Signed)
  Phone Note Refill Request Call back at [phone]   Refills Requested: Medication #1:  CRESTOR 20 MG TABS 1 tab by mouth at bedtime for cholesterol Ms. Hayden called about her rx for HCTZ.  New rx she picked up had her mg @ 12.5 instead of 25mg .  Want to know if you had changed it last visit.  And need refill on her cholesterol meds called in to pharmacy.  Please call her back to discuss the change of the HCTZ.  Initial call taken by: Abundio Miu,  September 15, 2010 11:12 AM Caller: Patient  Follow-up for Phone Call        Spoke with Ms. Hairston and informed her to take her meds as instructed.  She said that the dosage she's on now is doing well.  Her bp is just fine.   Follow-up by: Abundio Miu,  September 21, 2010 2:50 PM    HCTZ should be 25mg  daily.  [facility] for 25mg  daily.  Not 12.5mg .  Crestor (cholesterol) was already refilled.  Cat Ta MD  September 16, 2010 12:07 PM

## 2010-12-13 NOTE — Miscellaneous (Signed)
Summary: Tobacco [name]  Clinical Lists Changes  Problems: Added new problem of TOBACCO [name] (ICD-305.1) 

## 2010-12-13 NOTE — Assessment & Plan Note (Signed)
Summary: Hosp f/u syncope   Vital Signs:  Patient profile:   77 year old female Height:      58 inches Weight:      126.6 pounds BMI:     26.56 Temp:     98.6 degrees F Pulse rate:   82 / minute BP sitting:   161 / 62  (right arm)  Vitals Entered By: Theresia Lo RN (August 22, 2010 10:19 AM) CC: discharged from hospital 08/19/2010, hosp follow up today Is Patient Diabetic? Yes Pain Assessment Patient in pain? no        Primary Care Provider:  Saafir Abdullah MD  CC:  discharged from hospital 08/19/2010 and hosp follow up today.  History of Present Illness: This 77 Years Old Black Female comes in today got hospital followup of   Syncope:  Pt was pre-syncope at home, neccessitating admission.  She had a negative CT head in ER and was transferred to [facility] for admission.  Her work-up during admission included orthostatics, Cardiac ischemia r/u, Echo, CTA head/neck.  Positive work up with stenosis of vertebral artery.  Neuro was consulted but they felt that pt was not a surgical candidate and that she should be managed medically.  She has since felt much better.  No more syncope or near-syncope episodes.  She is taking all her meds as directed.    UTI: During work up it seems pt had UTI (asymptomatic).  Ucx grew >100k Ecoli.  Pt on Keflex 500mg  two times a day x 10 days (started on Sat 10/8).  No fever, chill, dysuria, urgency, frequency.  Vertebrolasilar Insuff:    CTAngio head/neck 1.  Severe stenosis at the origin of the proximal left vertebral   artery, the dominant vessel.   2.  Greater than 50% stenosis at the origin of the proximal right   vertebral artery, the nondominant vessel.   3.  Severe stenosis of the distal left vertebral artery with heavy   calcification at the dural margin.   4.  Proximal stenosis of the basilar artery.   5.  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  This is likely extradural.   6.  High-grade stenosis of the cavernous left  internal carotid   artery with dense calcifications.   7.  Multinodular goiter.  Dominant lesion is in the right lobe   measuring 17 mm.  This may be a solid lesion.  Further evaluation   with ultrasound would be useful to evaluate for possible biopsy   HYPERTENSION Disease Monitoring Blood pressure range: 150s-160s Medications: amlodipine 10, coreg 12.5 mid, HCTZ 25 Compliance: yes Lightheadedness: no  Chest pain: no  Dyspnea: no Exercise: no  Salt restriction:no   Habits & Providers  Alcohol-Tobacco-Diet     Tobacco Status: current     Tobacco Counseling: to quit use of tobacco products     Other Tobacco snuff     Other per week uses 1 can per week  Current Medications (verified): 1)  Adult Aspirin Low Strength 81 Mg Chew (Aspirin) .... Take 1 Tablet By Mouth Once A Day 2)  Glipizide 5 Mg Tabs (Glipizide) .Marland Kitchen.. 1 Tab By Mouth in The Morning For Diabetes 3)  Metformin Hcl 1000 Mg Tabs (Metformin Hcl) .Marland Kitchen.. 1 Tab By Mouth Two Times A Day 4)  Zocor 40 Mg Tabs (Simvastatin) .... One Tab By Mouth Daily 5)  Carvedilol 12.5 Mg  Tabs (Carvedilol) .Marland Kitchen.. 1 Pill By Mouth 2 Times Daily 6)  Norvasc 10 Mg  Tabs (Amlodipine Besylate) .Marland Kitchen.. 1 By Mouth Daily 7)  Hydrochlorothiazide 25 Mg Tabs (Hydrochlorothiazide) .Marland Kitchen.. 1 Tab By Mouth Daily For Blood Pressure 8)  Ventolin Hfa 108 (90 Base) Mcg/act Aers (Albuterol Sulfate) .... 2-4 Puffs Every 4 Hours As Needed For Shortness of Breath or Wheezing. 9)  Flovent Diskus 100 Mcg/blist Aepb (Fluticasone Propionate (Inhal)) .Marland Kitchen.. 1 Puff Inhaled Two Times A Day 10)  Fluticasone Propionate 50 Mcg/act Susp (Fluticasone Propionate) .... 2 Sprays Each Nostril Daily 11)  Plavix 75 Mg Tabs (Clopidogrel Bisulfate) .Marland Kitchen.. 1 Tab By Mouth Daily 12)  Ranitidine Hcl 150 Mg Caps (Ranitidine Hcl) .Marland Kitchen.. 1 Tab By Mouth Two Times A Day  Allergies (verified): 1)  ! * Lsinopril  Past History:  Past Medical History: Last updated: 08/02/2010 Back Pain, known L2 compression  fracture Colon cancer, hx of  (s/p resection)  followed by [facility] Congestive heart failure - [facility] - last EF 25-35% in May 2008 Coronary artery disease Diabetes mellitus, type II GERD Gastrointestinal hemorrhage, hx of  (Followed by Dr Loreta Ave) Gout Hyperlipidemia Hypertension Anemia-iron deficiency GYN:  Dr Stefano Gaul  Family History: Last updated: 01/10/2007 sickle cell disease in [place]  Social History: Last updated: 05/05/2010 widowed; no smoking or etoh use, dips snuff; retired  Enjoys cooking.  lives with grandson.  Has a daughter, Tiana Sivertson, who helps out.  Another daughter, Bindu Docter, is a patient here.  [name] is mother to Ebony Hail and [name] (frequent hospital admits for sickle cell crises).   Risk Factors: Smoking Status: current (08/22/2010) Cans of tobacco/wk: yes (07/12/2007)  Past Surgical History: -CTAngio head/neck 08/18/10: severe stenosis of prox L vertebral A, >50% stenosis prox R vertebral A, severe stenosis of distal L vertebral A.  Neuro states medical management, not surgery candidate -C-spine 08/18/10: spondylosis C4-C5, C5-C6 -CT head 08/17/10: Chronic small vessel ischemia with mild progression since 2008 -cardiolyte- no inducible ischemia, ef 73% - 08/23/2006,  -cath- severe 3 vessel disease not CABG candidate - 01/01/2007,  -EF 20-25% - 01/01/2007,  -EGD (esoph tear, injected with epi) - 02/07/2000, -H -Holter monitor 5/94-sinus rhythm -,  -LAD, DES for three vessel CAD 1/08 - 01/01/2007, -low ant resection for colon CA 1/08 - 12/17/2006,  -Nl EKG x mild prolonged QT (7/01) - 05/15/2000,  -Total knee replacement R 8/01 - 10/31/2000  Review of Systems       per hpi   Physical Exam  General:  Well-developed,well-nourished,in no acute distress; alert,appropriate and cooperative throughout examination. vitals reviewed.  Neck:  No deformities, masses, or tenderness noted. Lungs:  Normal respiratory effort, chest expands  symmetrically. Lungs are clear to auscultation, no crackles or wheezes. Heart:  Normal rate and regular rhythm. S1 and S2 normal without gallop, murmur, click, rub or other extra sounds. no bruit.  Abdomen:  Bowel sounds positive,abdomen soft and non-tender without masses, organomegaly or hernias noted. Pulses:  R radial normal, R dorsalis pedis normal, R carotid normal, L radial normal, L dorsalis pedis normal, and L carotid normal.   Extremities:  No clubbing, cyanosis, edema, or deformity noted with normal full range of motion of all joints.   Neurologic:  alert & oriented X3 and cranial nerves II-XII intact.     Impression & Recommendations:  Problem # 1:  UTI (ICD-599.0) Assessment Unchanged Continue Keflex for total of 10 days.  Pt asymptomatic.  Orders: [facility]- Est  Level 4 ([address]109)  Problem # 2:  SYNCOPE, HX OF (ICD-V12.49) Assessment: Improved DDx  includes cardiac ischemia, tia/cva, orthostatics, uti, vertebra insuff.  At this point we will try to optimize risk lowering with controlling lipids, dm, htn.  Lipids are at goal with LDL 67  HDL  36 Trig  79, DM with Z3Y 7.1.  BP is not well controlled at this time.  We have been hestitant to lower BP given her age and concerns for adverse s/e.  Goal should be at 140/90.  She is not there yet.   Orders: [facility]- Est  Level 4 (86578)  Problem # 3:  VERTEBROBASILAR ARTERY SYNDROME (ICD-435.3) Assessment: New Optimize lowering risk factors.  Neuro declined consult in hospital.  They think she is a poor surgical candidate and should be mangaged medically.   Her updated medication list for this problem includes:    Adult Aspirin Low Strength 81 Mg Chew (Aspirin) .Marland Kitchen... Take 1 tablet by mouth once a day    Plavix 75 Mg Tabs (Clopidogrel bisulfate) .Marland Kitchen... 1 tab by mouth daily  Orders: [facility]- Est  Level 4 (46962)  Problem # 4:  HYPERTENSION, BENIGN SYSTEMIC (ICD-401.1) Assessment: Unchanged Wlll continue on current meds (HCTZ 25mg  is higher than  previous of 12.5).  Pt denies any problems with this dose.  Will rtc in 2-3 wks for BP monitoring.  Goal 140/90. Her updated medication list for this problem includes:    Carvedilol 12.5 Mg Tabs (Carvedilol) .Marland Kitchen... 1 pill by mouth 2 times daily    Norvasc 10 Mg Tabs (Amlodipine besylate) .Marland Kitchen... 1 by mouth daily    Hydrochlorothiazide 25 Mg Tabs (Hydrochlorothiazide) .Marland Kitchen... 1 tab by mouth daily for blood pressure  Orders: [facility]- Est  Level 4 (95284)  Complete Medication List: 1)  Adult Aspirin Low Strength 81 Mg Chew (Aspirin) .... Take 1 tablet by mouth once a day 2)  Glipizide 5 Mg Tabs (Glipizide) .Marland Kitchen.. 1 tab by mouth in the morning for diabetes 3)  Metformin Hcl 1000 Mg Tabs (Metformin hcl) .Marland Kitchen.. 1 tab by mouth two times a day 4)  Zocor 40 Mg Tabs (Simvastatin) .... One tab by mouth daily 5)  Carvedilol 12.5 Mg Tabs (Carvedilol) .Marland Kitchen.. 1 pill by mouth 2 times daily 6)  Norvasc 10 Mg Tabs (Amlodipine besylate) .Marland Kitchen.. 1 by mouth daily 7)  Hydrochlorothiazide 25 Mg Tabs (Hydrochlorothiazide) .Marland Kitchen.. 1 tab by mouth daily for blood pressure 8)  Ventolin Hfa 108 (90 Base) Mcg/act Aers (Albuterol sulfate) .... 2-4 puffs every 4 hours as needed for shortness of breath or wheezing. 9)  Flovent Diskus 100 Mcg/blist Aepb (Fluticasone propionate (inhal)) .Marland Kitchen.. 1 puff inhaled two times a day 10)  Fluticasone Propionate 50 Mcg/act Susp (Fluticasone propionate) .... 2 sprays each nostril daily 11)  Plavix 75 Mg Tabs (Clopidogrel bisulfate) .Marland Kitchen.. 1 tab by mouth daily 12)  Ranitidine Hcl 150 Mg Caps (Ranitidine hcl) .Marland Kitchen.. 1 tab by mouth two times a day  Patient Instructions: 1)  Please schedule a follow-up appointment in 2-3 weeks for blood pressure. 2)  I want you to call me if you start to feel bad. 3)  Take all your medications as before. 4)  Continue taking Keflex two times a day for bladder infection. 5)    Prescriptions: HYDROCHLOROTHIAZIDE 25 MG TABS (HYDROCHLOROTHIAZIDE) 1 tab by mouth daily for blood  pressure  #30 x 3   Entered and Authorized by:   [name] MD   Signed by:   [name] MD on 08/22/2010   Method used:   [facility] [address] [phone]* (retail)       [address]       [place], [place]  [address]       Ph: [phone] or [phone]       Fax: [phone]   RxID:   [id number] HYDROCHLOROTHIAZIDE 12.5 MG TABS (HYDROCHLOROTHIAZIDE) 1 tab by mouth daily for blood pressure  #30 x 3   Entered and Authorized by:   [name] MD   Signed by:   [name] MD on 08/22/2010   Method used:   [facility] to        [facility] [address] [phone]* (retail)       [address]       [place], [place]  [address]       Ph: [phone] or [phone]       Fax: [phone]   RxID:   [id number]      Echocardiogram  Procedure date:  08/18/2010  Findings:       Left ventricle: The cavity size was normal. Systolic function was     normal. The estimated ejection fraction was in the range of 60% to  65%. Wall motion was normal; there were no regional wall motion abnormalities. Doppler parameters are consistent with abnormal left ventricular relaxation (grade 1 diastolic dysfunction).   - Ventricular septum: Thickness was mildly increased. The contour     showed systolic flattening.   - Aortic valve: Trivial regurgitation.  Grade 1 diastolic dysfunction Ejection Fraction:  >=50%.  Location:  [facility].

## 2010-12-13 NOTE — Progress Notes (Signed)
Summary: Rx Req  Phone Note Refill Request Call back at Home Phone [phone] Message from:  Patient  Refills Requested: Medication #1:  VENTOLIN HFA 108 (90 BASE) MCG/ACT AERS 2-4 puffs every 4 hours as needed for shortness of breath or wheezing. [facility]  Initial call taken by: Clydell Hakim,  May 18, 2010 3:18 PM    Prescriptions: VENTOLIN HFA 108 (90 BASE) MCG/ACT AERS (ALBUTEROL SULFATE) 2-4 puffs every 4 hours as needed for shortness of breath or wheezing.  #1 x 11   Entered and Authorized by:   [name] MD   Signed by:   [name] MD on 05/19/2010   Method used:   Electronically to        [facility] [address] [phone]* (retail)       [address]       [place], [place]  [address]       Ph: [phone] or [phone]       Fax: [phone]   RxID:   [id number]

## 2010-12-13 NOTE — Consult Note (Signed)
Summary: [facility]   Imported By: De Nurse 09/30/2010 16:17:04  _____________________________________________________________________  External Attachment:    Type:   Image     Comment:   External Document

## 2010-12-13 NOTE — Assessment & Plan Note (Signed)
Summary: FU/KH   Vital Signs:  Patient profile:   77 year old female Height:      58 inches Weight:      125 pounds Pulse rate:   90 / minute BP sitting:   155 / 82  (left arm) Cuff size:   regular  Vitals Entered By: San Morelle, SMA CC: F/U   Primary Care Provider:  Ardeen Garland  MD  CC:  F/U.  History of Present Illness: Ms. Shadle comes in today for follow-up of diabetes, HTN, and anxiety 1) DM - on metformin and glipizide.  Tolerating well.  A1C have been < 7.  No lows.  Reports range of 110-150 in last 6 weeks.  Did not bring meter though.  2) HTN - on coreg, norvasc, and HCTZ.  Tolerating well.  BP not at goal for diabetic, but is acceptable given her age and comorbidities.  3) Anxiety - son recently passed away and having anxiety and panic attacks at times.  Finding it very difficult to sleep because she thinks about him all night and gets anxious.  Taking her valium at 5 pm only.  States she is slowly getting better.  Has lots of family and support.   Habits & Providers  Alcohol-Tobacco-Diet     Tobacco Status: current     Other Tobacco snuff  Allergies: 1)  ! * Lsinopril  Physical Exam  General:  alert, well-developed, well-nourished, and well-hydrated.  NAD, cooperative to exam vitals reviewed  Lungs:  normal WOB, clear to ausc bilaterally Heart:  RRR without murmur Pulses:  2+ radial Extremities:  no edema Psych:  Oriented X3, memory intact for recent and remote, normally interactive, good eye contact, not anxious appearing, and not depressed appearing.  Oriented X3, memory intact for recent and remote, normally interactive, good eye contact, not anxious appearing, and not depressed appearing.     Impression & Recommendations:  Problem # 1:  DIABETES MELLITUS II, UNCOMPLICATED (ICD-250.00) Assessment Unchanged A1C pending.  Anticipate no change to regimen given sugars lately.  Her updated medication list for this problem includes:    Adult Aspirin Low  Strength 81 Mg Chew (Aspirin) .Marland Kitchen... Take 1 tablet by mouth once a day    Glipizide 5 Mg Tabs (Glipizide) .Marland Kitchen... 1 tab by mouth in the morning for diabetes    Metformin Hcl 1000 Mg Tabs (Metformin hcl) .Marland Kitchen... 1 tab by mouth two times a day  Orders: A1C-[facility] (84132) [facility]- Est  Level 4 (44010)  Problem # 2:  HYPERTENSION, BENIGN SYSTEMIC (ICD-401.1)  At acceptable value given age and comorbidities.  No chagnes.  Her updated medication list for this problem includes:    Carvedilol 12.5 Mg Tabs (Carvedilol) .Marland Kitchen... 1 pill by mouth 2 times daily    Norvasc 10 Mg Tabs (Amlodipine besylate) .Marland Kitchen... 1 by mouth daily    Hydrochlorothiazide 12.5 Mg Caps (Hydrochlorothiazide) .Marland Kitchen... 1 tab by mouth daily  Orders: [facility]- Est  Level 4 (27253)  Problem # 3:  GRIEF REACTION (ICD-309.0) Assessment: New  Continue valium.  Tolerating without side effects.  Advised taking it prior to bed to see if it helps her sleep better.   Orders: [facility]- Est  Level 4 (66440)  Complete Medication List: 1)  Adult Aspirin Low Strength 81 Mg Chew (Aspirin) .... Take 1 tablet by mouth once a day 2)  Caltrate 600+d Plus 600-400 Mg-unit Tabs (Calcium carbonate-vit d-min) .... Take 1 tablet by mouth twice a day 3)  Carvedilol 12.5 Mg Tabs (Carvedilol) .Marland KitchenMarland KitchenMarland Kitchen  1 pill by mouth 2 times daily 4)  Glipizide 5 Mg Tabs (Glipizide) .Marland Kitchen.. 1 tab by mouth in the morning for diabetes 5)  Metformin Hcl 1000 Mg Tabs (Metformin hcl) .Marland Kitchen.. 1 tab by mouth two times a day 6)  Plavix 75 Mg Tabs (Clopidogrel bisulfate) 7)  Zocor 40 Mg Tabs (Simvastatin) .... One tab by mouth daily 8)  Ferrous Sulfate 325 (65 Fe) Mg Tbec (Ferrous sulfate) .... Two times a day 9)  Norvasc 10 Mg Tabs (Amlodipine besylate) .Marland Kitchen.. 1 by mouth daily 10)  Hydrochlorothiazide 12.5 Mg Caps (Hydrochlorothiazide) .Marland Kitchen.. 1 tab by mouth daily 11)  Fluoxetine Hcl 20 Mg Tabs (Fluoxetine hcl) .... Take 1 tab by mouth daily 12)  Ventolin Hfa 108 (90 Base) Mcg/act Aers (Albuterol sulfate) ....  2-4 puffs every 4 hours as needed for shortness of breath or wheezing. 13)  Flovent Diskus 100 Mcg/blist Aepb (Fluticasone propionate (inhal)) .Marland Kitchen.. 1 puff inhaled two times a day 14)  Colchicine 0.6 Mg Tabs (Colchicine) .Marland Kitchen.. 1 tab by mouth three times a day as needed for foot pain 15)  Megestrol Acetate 40 Mg/ml Susp (Megestrol acetate) .Marland Kitchen.. 10ml by mouth daily quantity: 1 month 16)  Loratadine 10 Mg Tabs (Loratadine) .Marland Kitchen.. 1 tab daily as needed for allergy symptoms 17)  Fluticasone Propionate 50 Mcg/act Susp (Fluticasone propionate) .... 2 sprays each nostril daily 18)  Gabapentin 300 Mg Caps (Gabapentin) .Marland Kitchen.. 1 tab by mouth qhs 19)  Keflex 500 Mg Caps (Cephalexin) .Marland Kitchen.. 1 tab by mouth two times a day for 7 days 20)  Valium 5 Mg Tabs (Diazepam) .Marland Kitchen.. 1 tablet two times a day as needed anxiety. Prescriptions: VALIUM 5 MG TABS (DIAZEPAM) 1 tablet two times a day as needed anxiety.  #40 x 0   Entered and Authorized by:   Ardeen Garland  MD   Signed by:   Ardeen Garland  MD on 02/10/2010   Method used:   Print then Give to Patient   RxID:   [id number] ZOCOR 40 MG TABS (SIMVASTATIN) One tab by mouth daily  #31 x 11   Entered and Authorized by:   Ardeen Garland  MD   Signed by:   Ardeen Garland  MD on 02/10/2010   Method used:   Print then Give to Patient   RxID:   [id number] CARVEDILOL 12.5 MG  TABS (CARVEDILOL) 1 pill by mouth 2 times daily  #180 x 5   Entered and Authorized by:   Ardeen Garland  MD   Signed by:   Ardeen Garland  MD on 02/10/2010   Method used:   Print then Give to Patient   RxID:   [id number]   Appended Document: A1c results  Laboratory Results   Blood Tests   Date/Time Received: February 10, 2010 2:57 PM  Date/Time Reported: February 10, 2010 3:49 PM   HGBA1C: 7.2%   (Normal Range: Non-Diabetic - 3-6%   Control Diabetic - 6-8%)  Comments: ...........test performed by............Marland KitchenBAJordan, MT(ASCP)3:49 PM entered by Terese Door, CMA

## 2010-12-15 NOTE — Progress Notes (Signed)
Summary: Checking on pt  Phone Note Outgoing Call   Call placed by: Deserie Dirks MD,  November 18, 2010 12:25 PM Call placed to: Patient Summary of Call: Called to check on pt.  She feels "pretty good".  States that her drain has been removed by Dr Ezzard Standing.  [facility].  She has been told that the surgery "got everything". She is having some pain now and then and that she is almost out of pain med.  She is taking only 1/2 tab of vicodin as needed pain.  I will call in a Rx of hydrocodone 5-325 #10 tabs [facility].  Initial call taken by: Tanyika Barros MD,  November 18, 2010 12:27 PM    Prescriptions: NORCO 5-325 MG TABS (HYDROCODONE-ACETAMINOPHEN) 1 tab by mouth every 4-6 hours as needed pain  #10 x 0   Entered and Authorized by:   [name] MD   Signed by:   [name] MD on 11/18/2010   Method used:   Telephoned to ...       [facility] [address] [phone]* (retail)       [address]       [place], [place]  [address]       Ph: [phone] or [phone]       Fax: [phone]   RxID:   [id number]

## 2010-12-15 NOTE — Progress Notes (Signed)
Summary: triage  Phone Note Call from Patient Call back at Home Phone [phone]   Caller: Patient Summary of Call: has a cold and wants to know what she can take. Initial call taken by: De Nurse,  November 28, 2010 9:14 AM  Follow-up for Phone Call        c/o cough. clear mucus. started saturday. usually takes robitussim. no other symptoms.had both breasts removed last months. does not want to come in to see md. told her to try the robitussin again. if she develops fever, mucus turns colors, does not feel well, call & get appt . tld her md can rx other rx meds that may help, but she would need to be seen. she agreed with plan Follow-up by: Golden Circle RN,  November 28, 2010 9:16 AM

## 2010-12-21 NOTE — Progress Notes (Signed)
Summary: needs meds  Phone Note Call from Patient Call back at Home Phone [phone]   Caller: Patient Summary of Call: had mastecomy in dec and was told to call if she had any pain - pt is having arm pain and wants [facility] Initial call taken by: De Nurse,  December 12, 2010 2:50 PM  Follow-up for Phone Call        Pt should call surgeon regarding pain since he performed the procedure. Follow-up by: [name] MD,  December 12, 2010 4:14 PM  Additional Follow-up for Phone Call Additional follow up Details #1::        called pt x 2. number busy. Additional Follow-up by: Arlyss Repress CMA,,  December 12, 2010 5:07 PM    Additional Follow-up for Phone Call Additional follow up Details #2::    pt states the pain went away so she did not call us back. does not need meds now. states she had not called the surgeon Follow-up by: Golden Circle RN,  December 14, 2010 10:08 AM

## 2010-12-27 ENCOUNTER — Other Ambulatory Visit: Payer: Self-pay | Admitting: Family Medicine

## 2010-12-27 NOTE — Telephone Encounter (Signed)
Please review and refill

## 2011-01-03 ENCOUNTER — Encounter (HOSPITAL_BASED_OUTPATIENT_CLINIC_OR_DEPARTMENT_OTHER): Payer: Medicare Other | Admitting: Hematology and Oncology

## 2011-01-03 ENCOUNTER — Other Ambulatory Visit: Payer: Self-pay | Admitting: Hematology and Oncology

## 2011-01-03 DIAGNOSIS — C20 Malignant neoplasm of rectum: Secondary | ICD-10-CM

## 2011-01-03 DIAGNOSIS — C19 Malignant neoplasm of rectosigmoid junction: Secondary | ICD-10-CM

## 2011-01-03 DIAGNOSIS — D509 Iron deficiency anemia, unspecified: Secondary | ICD-10-CM

## 2011-01-03 DIAGNOSIS — Z9049 Acquired absence of other specified parts of digestive tract: Secondary | ICD-10-CM

## 2011-01-03 DIAGNOSIS — C50919 Malignant neoplasm of unspecified site of unspecified female breast: Secondary | ICD-10-CM

## 2011-01-03 LAB — COMPREHENSIVE METABOLIC PANEL
ALT: <8 U/L (ref 0–35)
AST: 12 U/L (ref 0–37)
Albumin: 4.1 g/dL (ref 3.5–5.2)
Calcium: 10.5 mg/dL (ref 8.4–10.5)
Chloride: 100 mEq/L (ref 96–112)
Potassium: 3.7 mEq/L (ref 3.5–5.3)
Sodium: 136 mEq/L (ref 135–145)

## 2011-01-03 LAB — CBC WITH DIFFERENTIAL/PLATELET
BASO%: 0.3 % (ref 0.0–2.0)
Basophils Absolute: 0 10*3/uL (ref 0.0–0.1)
EOS%: 2.4 % (ref 0.0–7.0)
HGB: 10.6 g/dL — ABNORMAL LOW (ref 11.6–15.9)
MCH: 23.5 pg — ABNORMAL LOW (ref 25.1–34.0)
MCHC: 31.6 g/dL (ref 31.5–36.0)
RBC: 4.52 10*6/uL (ref 3.70–5.45)
RDW: 15.9 % — ABNORMAL HIGH (ref 11.2–14.5)
lymph#: 0.9 10*3/uL (ref 0.9–3.3)

## 2011-01-15 IMAGING — CT CT ABD-PELV W/ CM
2 of 5 series · 17 of 46 positions shown, 19 images · IV contrast (omnipaque)
Comparison: CT 07/08/2008, CT thorax 04/24/2007

CT CHEST

CLINICAL DATA: Colon cancer.  Oral chemotherapy ongoing.  Prior
colonic resection

CT CHEST, ABDOMEN AND PELVIS WITH CONTRAST
TECHNIQUE: Multidetector CT imaging of the chest, abdomen and
pelvis was performed following the standard protocol during bolus
administration of intravenous contrast.
Contrast: 100 ml Omnipaque 300

[Series 2: cap with st · axial · 0.73mm/px · z∈[-486,-6]mm · 14 of 109 slices shown, 16 images]
[im 7/109  soft-tissue]
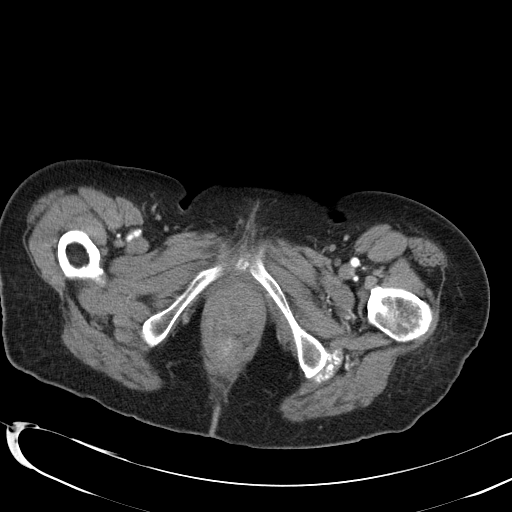
[im 7/109  bone]
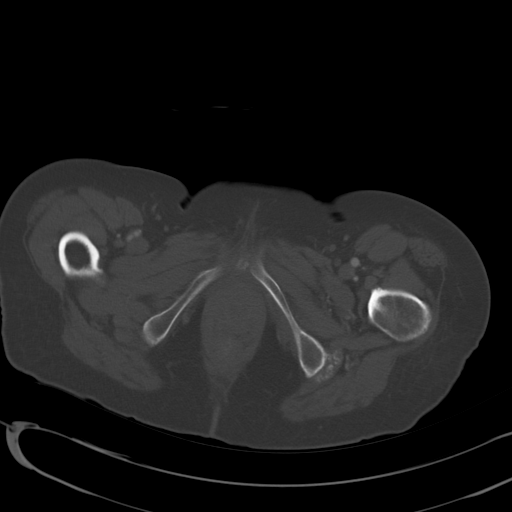
[im 13/109  soft-tissue]
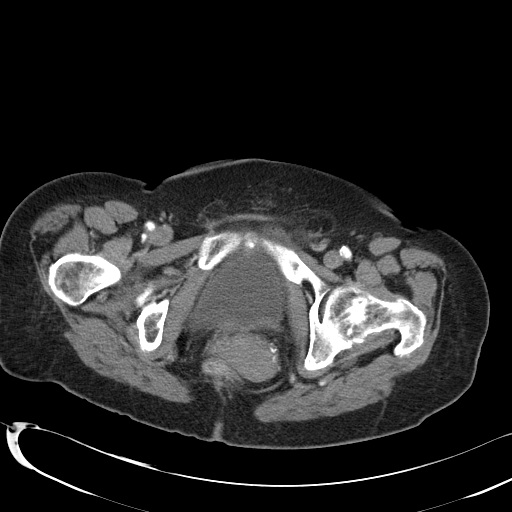
[im 25/109  soft-tissue]
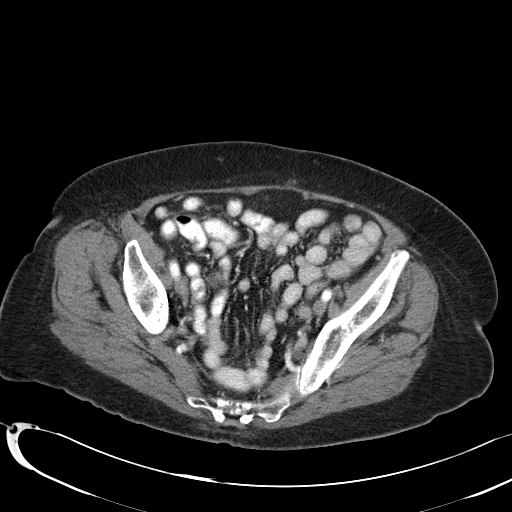
[im 31/109  soft-tissue]
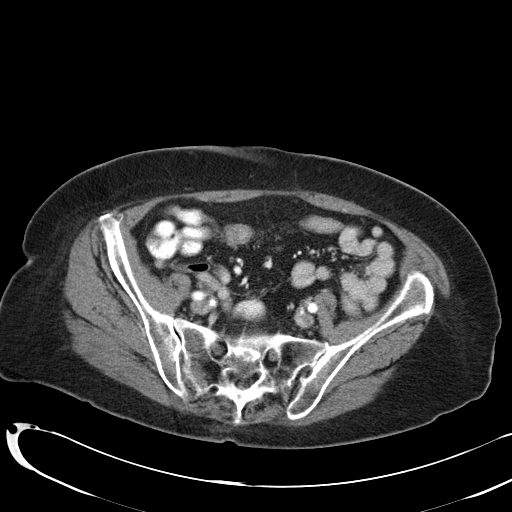
[im 37/109  soft-tissue]
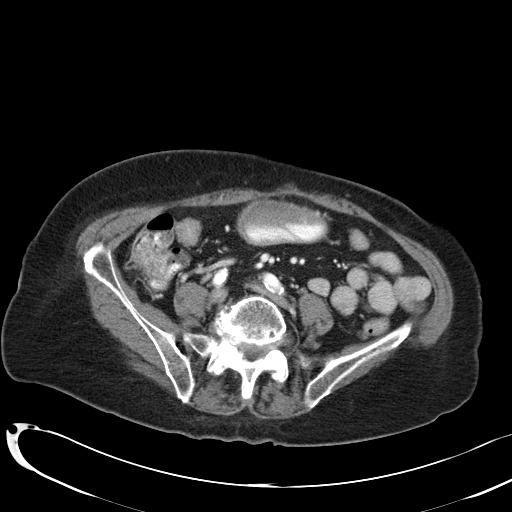
[im 43/109  soft-tissue]
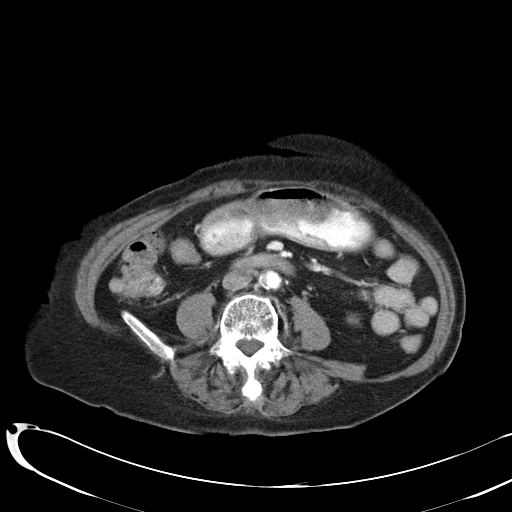
[im 49/109  soft-tissue]
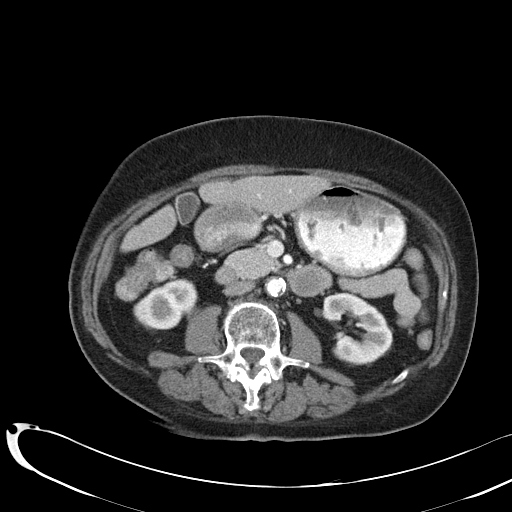
[im 61/109  soft-tissue]
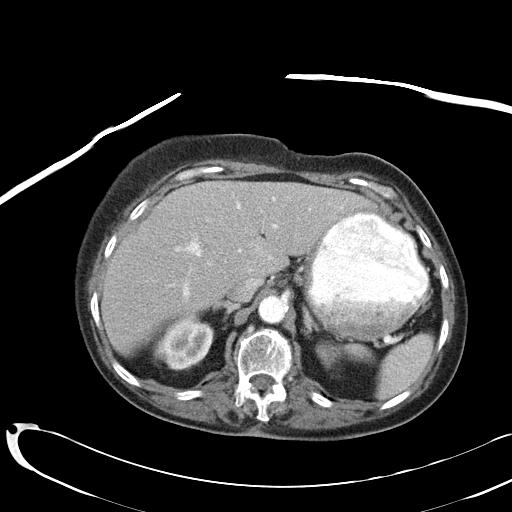
[im 67/109  soft-tissue]
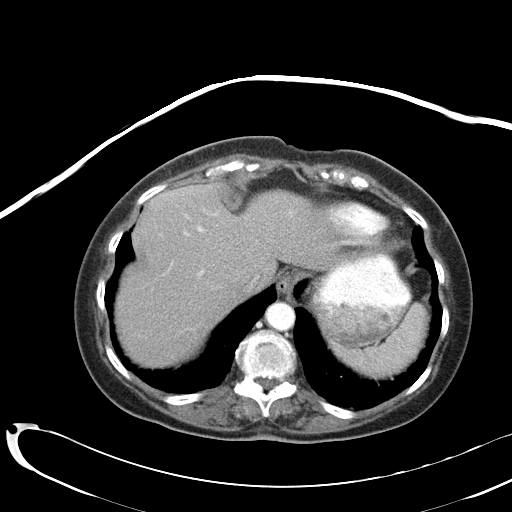
[im 67/109  bone]
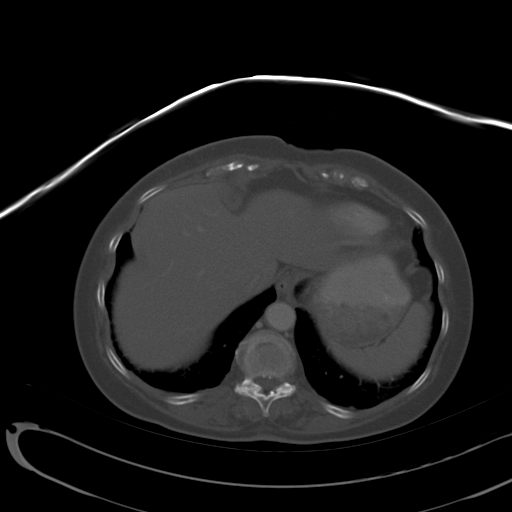
[im 73/109  soft-tissue]
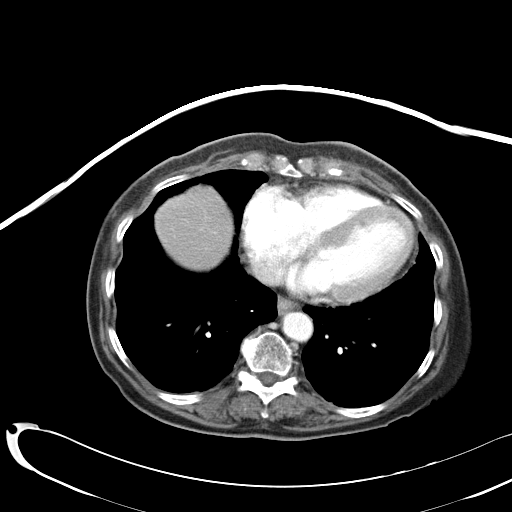
[im 79/109  soft-tissue]
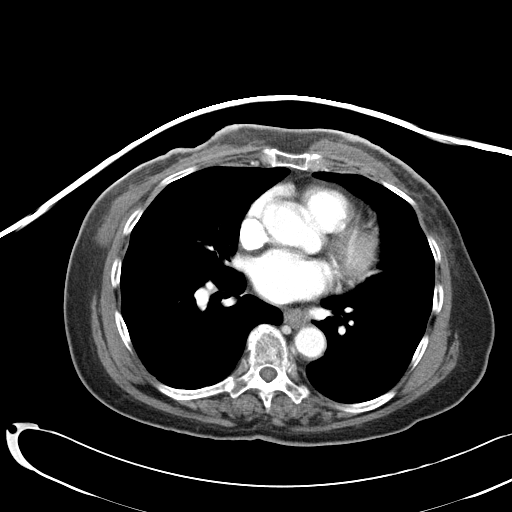
[im 85/109  soft-tissue]
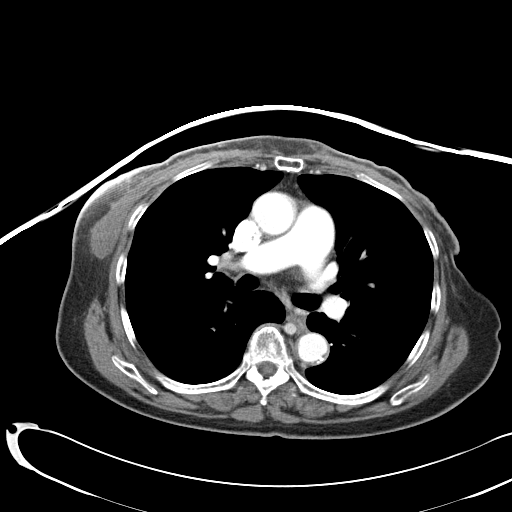
[im 97/109  soft-tissue]
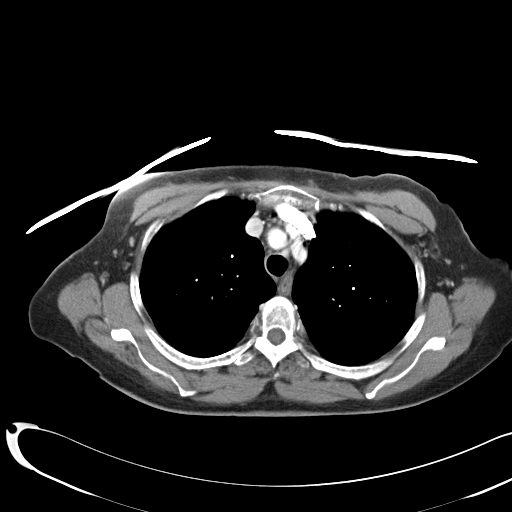
[im 103/109  soft-tissue]
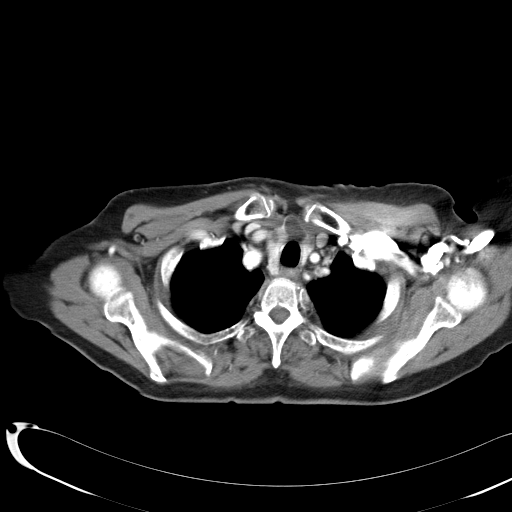

[Series 602: <mpr thick range> · coronal · 1.06mm/px · 3 of 71 slices shown]
[im 24/71  soft-tissue]
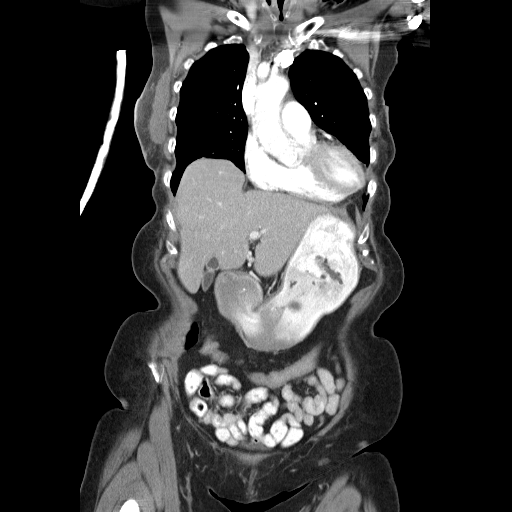
[im 32/71  soft-tissue]
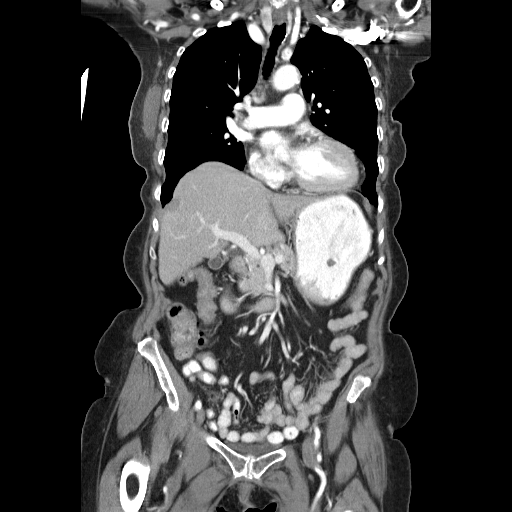
[im 39/71  soft-tissue]
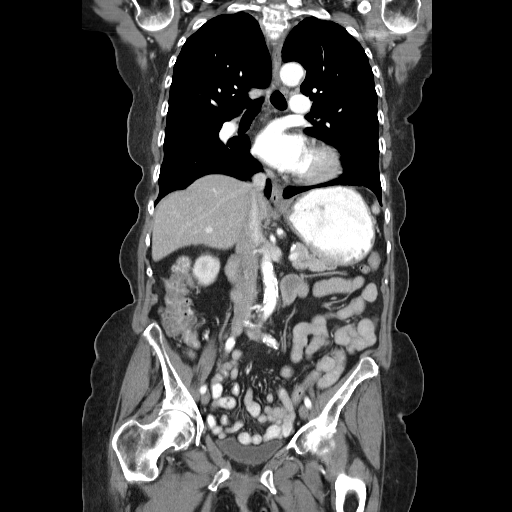

[17 of 46 positions shown; findings below may reference images not displayed]

FINDINGS: No evidence of axillary lymphadenopathy.  There is a
fluid collection along the right lateral chest wall is likely
related to mastectomies.  There are low density lesions within the
thyroid gland which are benign-appearing.  Right lower paratracheal
lymph node measuring 12 mm is unchanged from prior.  No pericardial
fluid.  Within the lingula,  3 mm pulmonary nodule is unchanged
from prior.  No new or suspicious pulmonary nodules.
IMPRESSION: 1.  No evidence of thoracic metastasis.
2.  Fluid collection along the right lateral chest wall likely
relates to prior mastectomy.

CT ABDOMEN AND PELVIS
FINDINGS: 3 mm low density lesion in the right hepatic lobe is not
clearly seen on prior but appears benign.  Gallbladder, pancreas,
spleen, adrenal glands,

The stomach, small bowel, and appendix, and cecum are normal.
There is a new anastomosis within the distal sigmoid colon without
evidence obstruction

Abdominal aorta is normal caliber.  No retroperitoneal
lymphadenopathy.  Portal lymphadenopathy.

No free fluid the pelvis.  The uterus is partially calcified.  The
ovaries are normal.  No evidence of pelvic lymphadenopathy.  There
is a small ventral fat filled hernia which is unchanged from prior.
Review of  bone windows demonstrates no aggressive osseous lesions.
IMPRESSION: 1.  No evidence of metastasis in the abdomen or pelvis.

2.  Stable rectosigmoid anastomosis.
3.  Small hepatic hypodensity appears benign.

## 2011-01-24 LAB — URINALYSIS, ROUTINE W REFLEX MICROSCOPIC
Bilirubin Urine: NEGATIVE
Glucose, UA: NEGATIVE mg/dL
Hgb urine dipstick: NEGATIVE
Ketones, ur: NEGATIVE mg/dL
Nitrite: NEGATIVE
Protein, ur: NEGATIVE mg/dL
Specific Gravity, Urine: 1.018 (ref 1.005–1.030)
Urobilinogen, UA: 1 mg/dL (ref 0.0–1.0)
pH: 6.5 (ref 5.0–8.0)

## 2011-01-24 LAB — CBC
HCT: 36 % (ref 36.0–46.0)
Hemoglobin: 11.5 g/dL — ABNORMAL LOW (ref 12.0–15.0)
MCH: 22.9 pg — ABNORMAL LOW (ref 26.0–34.0)
MCHC: 31.9 g/dL (ref 30.0–36.0)
MCV: 71.7 fL — ABNORMAL LOW (ref 78.0–100.0)
Platelets: 214 10*3/uL (ref 150–400)
RBC: 5.02 MIL/uL (ref 3.87–5.11)
RDW: 16.5 % — ABNORMAL HIGH (ref 11.5–15.5)
WBC: 6.2 K/uL (ref 4.0–10.5)

## 2011-01-24 LAB — BASIC METABOLIC PANEL WITH GFR
Calcium: 11 mg/dL — ABNORMAL HIGH (ref 8.4–10.5)
GFR calc non Af Amer: 54 mL/min — ABNORMAL LOW (ref >60)
Glucose, Bld: 123 mg/dL — ABNORMAL HIGH (ref 70–99)
Sodium: 140 meq/L (ref 135–145)

## 2011-01-24 LAB — DIFFERENTIAL
Basophils Absolute: 0 K/uL (ref 0.0–0.1)
Basophils Relative: 0 % (ref 0–1)
Eosinophils Absolute: 0.1 10*3/uL (ref 0.0–0.7)
Eosinophils Relative: 1 % (ref 0–5)
Lymphocytes Relative: 24 % (ref 12–46)
Lymphs Abs: 1.5 10*3/uL (ref 0.7–4.0)
Monocytes Absolute: 0.5 10*3/uL (ref 0.1–1.0)
Monocytes Relative: 8 % (ref 3–12)
Neutro Abs: 4.2 K/uL (ref 1.7–7.7)
Neutrophils Relative %: 67 % (ref 43–77)

## 2011-01-24 LAB — BASIC METABOLIC PANEL
BUN: 18 mg/dL (ref 6–23)
CO2: 28 mEq/L (ref 19–32)
Chloride: 103 mEq/L (ref 96–112)
Creatinine, Ser: 0.98 mg/dL (ref 0.4–1.2)
GFR calc Af Amer: >60 mL/min (ref >60)
Potassium: 3.7 mEq/L (ref 3.5–5.1)

## 2011-01-24 LAB — POCT CARDIAC MARKERS
CKMB, poc: 4.6 ng/mL (ref 1.0–8.0)
Myoglobin, poc: 63.5 ng/mL (ref 12–200)
Troponin i, poc: <0.05 ng/mL (ref 0.00–0.09)
Troponin i, poc: <0.05 ng/mL (ref 0.00–0.09)

## 2011-01-24 LAB — COMPREHENSIVE METABOLIC PANEL
AST: 16 U/L (ref 0–37)
Albumin: 3.7 g/dL (ref 3.5–5.2)
BUN: 17 mg/dL (ref 6–23)
Calcium: 10.6 mg/dL — ABNORMAL HIGH (ref 8.4–10.5)
Chloride: 106 mEq/L (ref 96–112)
Creatinine, Ser: 0.85 mg/dL (ref 0.4–1.2)
GFR calc Af Amer: >60 mL/min (ref >60)
Total Protein: 8 g/dL (ref 6.0–8.3)

## 2011-01-24 LAB — GLUCOSE, CAPILLARY
Glucose-Capillary: 105 mg/dL — ABNORMAL HIGH (ref 70–99)
Glucose-Capillary: 124 mg/dL — ABNORMAL HIGH (ref 70–99)
Glucose-Capillary: 124 mg/dL — ABNORMAL HIGH (ref 70–99)
Glucose-Capillary: 136 mg/dL — ABNORMAL HIGH (ref 70–99)

## 2011-01-24 LAB — SURGICAL PCR SCREEN: MRSA, PCR: NEGATIVE

## 2011-01-24 LAB — URINE MICROSCOPIC-ADD ON

## 2011-01-26 LAB — GLUCOSE, CAPILLARY
Glucose-Capillary: 119 mg/dL — ABNORMAL HIGH (ref 70–99)
Glucose-Capillary: 149 mg/dL — ABNORMAL HIGH (ref 70–99)
Glucose-Capillary: 170 mg/dL — ABNORMAL HIGH (ref 70–99)
Glucose-Capillary: 141 mg/dL — ABNORMAL HIGH (ref 70–99)

## 2011-01-26 LAB — BASIC METABOLIC PANEL
CO2: 26 mEq/L (ref 19–32)
Calcium: 10.4 mg/dL (ref 8.4–10.5)
Chloride: 106 mEq/L (ref 96–112)
Creatinine, Ser: 0.97 mg/dL (ref 0.4–1.2)
GFR calc Af Amer: >60 mL/min (ref >60)
Glucose, Bld: 178 mg/dL — ABNORMAL HIGH (ref 70–99)
Potassium: 3.4 mEq/L — ABNORMAL LOW (ref 3.5–5.1)
Sodium: 138 mEq/L (ref 135–145)
Sodium: 139 mEq/L (ref 135–145)

## 2011-01-26 LAB — CBC
Hemoglobin: 11.3 g/dL — ABNORMAL LOW (ref 12.0–15.0)
Hemoglobin: 9.5 g/dL — ABNORMAL LOW (ref 12.0–15.0)
MCHC: 31.3 g/dL (ref 30.0–36.0)
MCV: 73.6 fL — ABNORMAL LOW (ref 78.0–100.0)
Platelets: 247 10*3/uL (ref 150–400)
Platelets: 243 10*3/uL (ref 150–400)
RBC: 4.77 MIL/uL (ref 3.87–5.11)
RBC: 4.29 MIL/uL (ref 3.87–5.11)
RBC: 4.58 MIL/uL (ref 3.87–5.11)
WBC: 7.4 10*3/uL (ref 4.0–10.5)
WBC: 5.5 10*3/uL (ref 4.0–10.5)
WBC: 5.8 10*3/uL (ref 4.0–10.5)

## 2011-01-26 LAB — DIFFERENTIAL
Lymphocytes Relative: 15 % (ref 12–46)
Lymphocytes Relative: 29 % (ref 12–46)
Lymphs Abs: 1.1 10*3/uL (ref 0.7–4.0)
Lymphs Abs: 1.6 10*3/uL (ref 0.7–4.0)
Monocytes Relative: 3 % (ref 3–12)
Neutro Abs: 6 10*3/uL (ref 1.7–7.7)
Neutrophils Relative %: 81 % — ABNORMAL HIGH (ref 43–77)
Neutrophils Relative %: 63 % (ref 43–77)

## 2011-01-26 LAB — CARDIAC PANEL(CRET KIN+CKTOT+MB+TROPI)
CK, MB: 1.5 ng/mL (ref 0.3–4.0)
Relative Index: INVALID (ref 0.0–2.5)
Total CK: 57 U/L (ref 7–177)
Troponin I: 0.02 ng/mL (ref 0.00–0.06)

## 2011-01-26 LAB — URINE CULTURE: Culture  Setup Time: 201110060158

## 2011-01-26 LAB — POCT I-STAT, CHEM 8
Chloride: 107 mEq/L (ref 96–112)
HCT: 37 % (ref 36.0–46.0)
Potassium: 3.6 mEq/L (ref 3.5–5.1)
Sodium: 141 mEq/L (ref 135–145)

## 2011-01-26 LAB — POCT CARDIAC MARKERS
Myoglobin, poc: 50.4 ng/mL (ref 12–200)
Troponin i, poc: 0.05 ng/mL (ref 0.00–0.09)

## 2011-01-26 LAB — URIC ACID: Uric Acid, Serum: 5.5 mg/dL (ref 2.4–7.0)

## 2011-01-26 LAB — LIPID PANEL
LDL Cholesterol: 67 mg/dL (ref 0–99)
Total CHOL/HDL Ratio: 3.3 RATIO
VLDL: 16 mg/dL (ref 0–40)

## 2011-01-26 LAB — SEDIMENTATION RATE: Sed Rate: 50 mm/hr — ABNORMAL HIGH (ref 0–22)

## 2011-01-26 LAB — URINE MICROSCOPIC-ADD ON

## 2011-01-26 LAB — URINALYSIS, ROUTINE W REFLEX MICROSCOPIC
Bilirubin Urine: NEGATIVE
Hgb urine dipstick: NEGATIVE
Ketones, ur: NEGATIVE mg/dL
Nitrite: NEGATIVE
Specific Gravity, Urine: 1.017 (ref 1.005–1.030)
Urobilinogen, UA: 0.2 mg/dL (ref 0.0–1.0)

## 2011-01-26 LAB — TSH: TSH: 2.042 u[IU]/mL (ref 0.350–4.500)

## 2011-01-31 ENCOUNTER — Telehealth: Payer: Self-pay | Admitting: Family Medicine

## 2011-01-31 ENCOUNTER — Encounter: Payer: Self-pay | Admitting: Family Medicine

## 2011-01-31 ENCOUNTER — Ambulatory Visit (INDEPENDENT_AMBULATORY_CARE_PROVIDER_SITE_OTHER): Payer: Medicare Other | Admitting: Family Medicine

## 2011-01-31 VITALS — BP 152/66 | HR 78 | Temp 98.5°F

## 2011-01-31 DIAGNOSIS — R05 Cough: Secondary | ICD-10-CM | POA: Insufficient documentation

## 2011-01-31 DIAGNOSIS — E119 Type 2 diabetes mellitus without complications: Secondary | ICD-10-CM

## 2011-01-31 DIAGNOSIS — M549 Dorsalgia, unspecified: Secondary | ICD-10-CM | POA: Insufficient documentation

## 2011-01-31 DIAGNOSIS — R059 Cough, unspecified: Secondary | ICD-10-CM | POA: Insufficient documentation

## 2011-01-31 LAB — POCT GLYCOSYLATED HEMOGLOBIN (HGB A1C): Hemoglobin A1C: 7

## 2011-01-31 MED ORDER — METFORMIN HCL 500 MG PO TABS: 500.0000 mg | ORAL_TABLET | Freq: Two times a day (BID) | ORAL | Status: DC

## 2011-01-31 MED ORDER — HYDROCODONE-ACETAMINOPHEN 5-325 MG PO TABS: ORAL_TABLET | ORAL | Status: DC

## 2011-01-31 MED ORDER — GUAIFENESIN 100 MG/5ML PO LIQD: 200.0000 mg | Freq: Four times a day (QID) | ORAL | Status: AC | PRN

## 2011-01-31 NOTE — Assessment & Plan Note (Signed)
New back pain started on after walking in yard that is very steeped (3 days).  Exam wnl.  Red flag: breast cancer history 10/2010. Will treat with hydrocodone 2.5 mg for now.  If pain still present in one week, pt to rtc.  Will xray spine if pain continues, to look for osteoblastic lesions.

## 2011-01-31 NOTE — Telephone Encounter (Signed)
Called pharmacy and was told that insurance will not pay for anything for cough. Cost of the medication prescribed is $6.42. Patient notified.

## 2011-01-31 NOTE — Telephone Encounter (Signed)
States that she can't get the cough meds b/c ins. Won't pay for it.  Needs something else.

## 2011-01-31 NOTE — Patient Instructions (Signed)
Please make a follow up appt in 6-8 weeks. But if your cough does not get better, please come back next week. It is good to see you again. For your diabetes: STOP glucotrol/glipizide.  CHANGE Metformin 1000mg  to 1/2 (half) pill in morning and supper time.  Then when all metformin is run out, it will be changed to 500mg  twice each day.  For back pain: Hydrocodone 5mg  take 1/2 (half) pill every 6 hours as needed for pain. For cough: Take Diabetic Tussin 2 teaspoons very 6 hours as needed for cough.

## 2011-01-31 NOTE — Assessment & Plan Note (Addendum)
A1C 7.0 today.  A1C has been in this range in the past.  Given her age of 63 and her report of CBG in the 45s today, I am very concerned regarding hypoglycemic events.  Her goal A1C is 8 so I will make these changes today: d/c glipizide and change metformin from 1000mg  bid to 500mg  bid. Pt to rtc in 6-8 wks for f/u. She will cont to check CBG today.  Will need to check FLP at next visit, last check was 08/2010  LDL 67, HDL  36, Trig 79.

## 2011-01-31 NOTE — Progress Notes (Signed)
  Subjective:    Patient ID: [name], female    DOB: 10/15/1924, 77 y.o.   MRN: [id number]  HPI Low back pain  3 days: She was walking around the yard on Sunday and it is pretty steep.  It has been a while since pt has taken a long walk like this.  She usually does not walk in the yard because she knows that she would have back pain from it.  Back pain is located in lower back.  No painful to palpation.  No radiation to legs.  No saddle paresthesia.   Red Flags Fecal/urinary incontinence: no Weakness: no Fever/chills: no Night pain: no Unexplained weight loss: no No relief with bedrest: no Cancer/immunosuppression: Breast cancer b/l sp masectomy 12/201.  Now on tamoxifen.  IV drug use:use PMH of osteoporosis or chronic steroid use: no  Cough x 1 week: No fever, chills. +productive white sputum.  No dyspnea.  No chest pain.  No headache. +Rhinorrhea.  +Watery eyes.  +White discharge from eyes in AM.  She does have history of allergic rhinitis, but this feels different.    Diabetes: Fasting CBGs: 80s-90s.  It was 46 this morning.  She is taking Metformin 1000mg  bid and Glipizide 5mg  with breakfast.  Symptoms: shakiness: no  Drowsiness: no  Jittery: no  Abd pain: no     Review of Systems Per hpi     Objective:   Physical Exam   General:  Well-developed,well-nourished,in no acute distress; alert,appropriate and cooperative throughout examination. vitals reviewed  HEENT: eomi, mmm, neck with full rom, no neck masses/nodes. Nasal: no erythema or discharge CVS: RRR, no murmurs, but distant heart sounds RESP: CTA b/l, no w/r/r CHEST:  Bilateral healing scars from masectomy. No erythema. No redness. No swelling. No discharge. No fluid. Nontender.  Msk:  Back Exam: Inspection: normal  Motion: R leg SLR lying caused SI pain  L leg XLR lying caused minimal SI pain Seated HS Flexibility: Palpable tenderness: at R SI joint and in inferior areas.  Also palpable tenderness in greater  trochanteric area and piriformis  FABER: Sensory change:no Reflex change:no Strength at foot is normal.   Gait Walking: is stable and normal with her walker      EXT: No LE edema, +2 pulses     Assessment & Plan:

## 2011-01-31 NOTE — Assessment & Plan Note (Signed)
Cough present x 1 week.  No fever, chills.  Lungs clear on exam (No crackles or wheezing).  Pt not dyspneic and appears comfortable.  Likely not pna or asthma exac.  Will Rx with Gauifenisin.  If still present in one wk, pt to rtc.

## 2011-02-03 LAB — POCT I-STAT, CHEM 8
Chloride: 104 mEq/L (ref 96–112)
HCT: 37 % (ref 36.0–46.0)
Hemoglobin: 12.6 g/dL (ref 12.0–15.0)
Potassium: 3.1 mEq/L — ABNORMAL LOW (ref 3.5–5.1)
Sodium: 138 mEq/L (ref 135–145)

## 2011-02-03 LAB — DIFFERENTIAL
Basophils Absolute: 0.1 10*3/uL (ref 0.0–0.1)
Basophils Relative: 2 % — ABNORMAL HIGH (ref 0–1)
Eosinophils Absolute: 0.1 10*3/uL (ref 0.0–0.7)
Lymphocytes Relative: 18 % (ref 12–46)
Monocytes Relative: 8 % (ref 3–12)
Neutrophils Relative %: 70 % (ref 43–77)

## 2011-02-03 LAB — COMPREHENSIVE METABOLIC PANEL
Alkaline Phosphatase: 52 U/L (ref 39–117)
CO2: 28 mEq/L (ref 19–32)
Chloride: 100 mEq/L (ref 96–112)
GFR calc Af Amer: >60 mL/min (ref >60)
Sodium: 135 mEq/L (ref 135–145)
Total Protein: 8.3 g/dL (ref 6.0–8.3)

## 2011-02-03 LAB — POCT CARDIAC MARKERS
CKMB, poc: 1 ng/mL (ref 1.0–8.0)
CKMB, poc: 1.3 ng/mL (ref 1.0–8.0)
Myoglobin, poc: 85.4 ng/mL (ref 12–200)
Troponin i, poc: <0.05 ng/mL (ref 0.00–0.09)
Troponin i, poc: <0.05 ng/mL (ref 0.00–0.09)

## 2011-02-03 LAB — CBC
RBC: 4.61 MIL/uL (ref 3.87–5.11)
WBC: 5.2 10*3/uL (ref 4.0–10.5)

## 2011-02-03 LAB — URINALYSIS, ROUTINE W REFLEX MICROSCOPIC
Bilirubin Urine: NEGATIVE
Ketones, ur: NEGATIVE mg/dL
Nitrite: POSITIVE — AB
Protein, ur: NEGATIVE mg/dL
Urobilinogen, UA: 1 mg/dL (ref 0.0–1.0)

## 2011-02-07 ENCOUNTER — Telehealth: Payer: Self-pay | Admitting: Family Medicine

## 2011-02-07 NOTE — Telephone Encounter (Signed)
Patient was here for face-to-face on 3/20 and brought paperwork for testing supplies.  The company has not rec'd it yet and is asking when it will be received. Please advise.  Angelique Blonder has looked through all of faxes and could not find it. Ref# [id number]

## 2011-02-13 NOTE — Telephone Encounter (Signed)
Called pt back.  I am unsure Pt has been sneezing, watery eyes, coughing.  No fever/chills.  Feels fine otherwise. Thinks her allergies are coming back.  Advised otc zyrtec or claritin.  Pt states that children don't have money for it.  Discussed that I filled out paperwork for testing strip already. But I will call company and have them fax me paperwork again to fill out.  Pt agreeable to plan.

## 2011-02-13 NOTE — Telephone Encounter (Signed)
Called [employer] back.  Spoke with Rep re paperwork for testing.  They have the paperwork there and will process it today.

## 2011-02-14 ENCOUNTER — Encounter: Payer: Self-pay | Admitting: Home Health Services

## 2011-02-16 ENCOUNTER — Encounter: Payer: Self-pay | Admitting: Family Medicine

## 2011-02-16 ENCOUNTER — Ambulatory Visit (INDEPENDENT_AMBULATORY_CARE_PROVIDER_SITE_OTHER): Payer: Medicare Other | Admitting: Family Medicine

## 2011-02-16 VITALS — BP 153/80 | HR 80 | Temp 98.1°F | Wt 126.0 lb

## 2011-02-16 DIAGNOSIS — M549 Dorsalgia, unspecified: Secondary | ICD-10-CM

## 2011-02-16 DIAGNOSIS — N390 Urinary tract infection, site not specified: Secondary | ICD-10-CM | POA: Insufficient documentation

## 2011-02-16 LAB — POCT URINALYSIS DIPSTICK
Bilirubin, UA: NEGATIVE
Blood, UA: NEGATIVE
Glucose, UA: 250
Ketones, UA: NEGATIVE
Nitrite, UA: POSITIVE
Spec Grav, UA: 1.02

## 2011-02-16 LAB — POCT UA - MICROSCOPIC ONLY

## 2011-02-16 MED ORDER — ACETAMINOPHEN 325 MG PO TABS: 650.0000 mg | ORAL_TABLET | Freq: Three times a day (TID) | ORAL | Status: DC

## 2011-02-16 MED ORDER — CYCLOBENZAPRINE HCL 5 MG PO TABS: 5.0000 mg | ORAL_TABLET | Freq: Two times a day (BID) | ORAL | Status: DC | PRN

## 2011-02-16 MED ORDER — CEPHALEXIN 500 MG PO CAPS: 500.0000 mg | ORAL_CAPSULE | Freq: Three times a day (TID) | ORAL | Status: AC

## 2011-02-16 NOTE — Progress Notes (Signed)
  Subjective:    Patient ID: [name], female    DOB: 11-21-1923, 77 y.o.   MRN: [id number]  HPI Urinary Tract Infection: Patient complains of frequency She has had symptoms for 2 days. Patient also complains of back pain and voiding every 2-3 hours, urine has odor, no hematuria, no dysuria, no fever, no chills, . Patient denies fever, sorethroat and stomach ache. Patient does have a history of recurrent UTI, with last one about 1 year ago.  She has never been hospitalized for this.  Patient does not have a history of pyelonephritis.   Back Pain: Patient presents for presents evaluation of low back problems.  Symptoms have been present for a few weeks and include stiffness in lower back, worse with getting in and out of bed, getting up from sitting position. . Initial inciting event: none. Symptoms are worst: n/a. Alleviating factors identifiable by patient are none. Exacerbating factors identifiable by patient are position change from sitting to standing or lying to sitting up.. Treatments so far initiated by patient: "muscle relaxer" cream from dollar store, it did not help.  Previous lower back problems: "back pain all my life".Previous workup: Lumbar Spine 09/2010:Degenerative disc disease particularly at L4-5.  Stable mild compression of L2.  No acute abnormality. No fever/chills.  No bowel or urinary symptoms (she does have some incontinence chronically).  No saddle parasthesia.  Pain does not radiate down legs.      Review of Systems Per hpi     Objective:   Physical Exam Gen: NAD, alert, oriented. Vitals reviewed HEENT: [place]/at CHEST: b/l masectomy scars healing well, no edema, no redness, no erythema CVS: RRR, no murmurs RESP: CTA b/l, no wheezing ABD: nt/nd, +BS BACK: +tenderness to palpation in lumbar spinal areas, no tenderness elsewhere. Leg raises ok. Gait is normal and slow with walking cane. EXT: no edema        Assessment & Plan:

## 2011-02-16 NOTE — Patient Instructions (Signed)
You have a bladder infection.  Take Keflex three times per day for 3 days. For back pain, take Tylenol 650mg  three times per day.  You can also take Flexeril (muscle relaxant) but this may cause drowsiness, so be careful when you walk.  If back pain continues or does not get better with medicine, call me 1-2 wks.  We may need a xray of your back at that time.

## 2011-02-18 ENCOUNTER — Encounter: Payer: Self-pay | Admitting: Family Medicine

## 2011-02-18 NOTE — Assessment & Plan Note (Addendum)
Pt with back pain that she describes as muscle spasm.  Back pain is located on lumbar spine.  She has history of L4-L5 DJD with L2 mild stenosis on xray 09/2010.  Will treat with tylenol 650mg  tid and Flexeril 5mg .  Advised pt regarding sedation --> fall risk with Flexeril.  Pt is also dx with UTI today, so pain can be due to that, but she does not have flank pain. Since pt also has history of breast cancer, I am concerned about osteoblastic mets causing back pain.  Discussed that if back pain continues for 2 wks or more then pt to call me.  We will get xray at that time. Pt agreeable to plan.

## 2011-02-18 NOTE — Assessment & Plan Note (Signed)
Pt complains frequency and UA showed +Nit and trace Leuks.  Will treat with Keflex.  Pt afebrile and there is no flank pain.

## 2011-02-21 LAB — CBC
Hemoglobin: 10.1 g/dL — ABNORMAL LOW (ref 12.0–15.0)
Hemoglobin: 9.6 g/dL — ABNORMAL LOW (ref 12.0–15.0)
MCHC: 32.5 g/dL (ref 30.0–36.0)
MCV: 72.6 fL — ABNORMAL LOW (ref 78.0–100.0)
RBC: 3.99 MIL/uL (ref 3.87–5.11)
RBC: 4.27 MIL/uL (ref 3.87–5.11)
WBC: 5 10*3/uL (ref 4.0–10.5)

## 2011-02-21 LAB — GLUCOSE, CAPILLARY
Glucose-Capillary: 121 mg/dL — ABNORMAL HIGH (ref 70–99)
Glucose-Capillary: 91 mg/dL (ref 70–99)

## 2011-02-21 LAB — LIPID PANEL
Cholesterol: 131 mg/dL (ref 0–200)
HDL: 18 mg/dL — ABNORMAL LOW (ref >39)
LDL Cholesterol: 100 mg/dL — ABNORMAL HIGH (ref 0–99)
Total CHOL/HDL Ratio: 7.3 RATIO

## 2011-02-21 LAB — BASIC METABOLIC PANEL
CO2: 23 mEq/L (ref 19–32)
CO2: 23 mEq/L (ref 19–32)
Calcium: 10.1 mg/dL (ref 8.4–10.5)
Chloride: 111 mEq/L (ref 96–112)
Creatinine, Ser: 0.8 mg/dL (ref 0.4–1.2)
GFR calc Af Amer: >60 mL/min (ref >60)
Glucose, Bld: 102 mg/dL — ABNORMAL HIGH (ref 70–99)
Potassium: 3.8 mEq/L (ref 3.5–5.1)
Sodium: 142 mEq/L (ref 135–145)

## 2011-02-21 LAB — CARDIAC PANEL(CRET KIN+CKTOT+MB+TROPI)
CK, MB: 1 ng/mL (ref 0.3–4.0)
Relative Index: INVALID (ref 0.0–2.5)
Total CK: 54 U/L (ref 7–177)

## 2011-02-21 LAB — HEMOGLOBIN A1C: Hgb A1c MFr Bld: 7.4 % — ABNORMAL HIGH (ref 4.6–6.1)

## 2011-02-27 LAB — URINALYSIS, ROUTINE W REFLEX MICROSCOPIC
Bilirubin Urine: NEGATIVE
Glucose, UA: NEGATIVE mg/dL
Hgb urine dipstick: NEGATIVE
Ketones, ur: NEGATIVE mg/dL
Leukocytes, UA: NEGATIVE
Nitrite: NEGATIVE
Protein, ur: 100 mg/dL — AB
Specific Gravity, Urine: 1.013 (ref 1.005–1.030)
Urobilinogen, UA: 1 mg/dL (ref 0.0–1.0)
pH: 7.5 (ref 5.0–8.0)

## 2011-02-27 LAB — POCT CARDIAC MARKERS
CKMB, poc: 1.4 ng/mL (ref 1.0–8.0)
CKMB, poc: 1.7 ng/mL (ref 1.0–8.0)
Myoglobin, poc: 37 ng/mL (ref 12–200)
Myoglobin, poc: 40.9 ng/mL (ref 12–200)
Troponin i, poc: <0.05 ng/mL (ref 0.00–0.09)
Troponin i, poc: <0.05 ng/mL (ref 0.00–0.09)

## 2011-02-27 LAB — BASIC METABOLIC PANEL
BUN: 11 mg/dL (ref 6–23)
CO2: 27 mEq/L (ref 19–32)
Calcium: 10.2 mg/dL (ref 8.4–10.5)
GFR calc non Af Amer: >60 mL/min (ref >60)
Glucose, Bld: 164 mg/dL — ABNORMAL HIGH (ref 70–99)
Potassium: 3.7 mEq/L (ref 3.5–5.1)

## 2011-02-27 LAB — BASIC METABOLIC PANEL WITH GFR
Chloride: 101 meq/L (ref 96–112)
Creatinine, Ser: 0.83 mg/dL (ref 0.4–1.2)
GFR calc Af Amer: >60 mL/min (ref >60)
Sodium: 136 meq/L (ref 135–145)

## 2011-02-27 LAB — DIFFERENTIAL
Basophils Absolute: 0.1 10*3/uL (ref 0.0–0.1)
Basophils Relative: 1 % (ref 0–1)
Eosinophils Absolute: 0.1 10*3/uL (ref 0.0–0.7)
Eosinophils Relative: 2 % (ref 0–5)
Lymphocytes Relative: 34 % (ref 12–46)
Lymphs Abs: 1.7 K/uL (ref 0.7–4.0)
Monocytes Absolute: 0.3 K/uL (ref 0.1–1.0)
Monocytes Relative: 5 % (ref 3–12)
Neutro Abs: 2.9 K/uL (ref 1.7–7.7)
Neutrophils Relative %: 57 % (ref 43–77)

## 2011-02-27 LAB — GLUCOSE, CAPILLARY: Glucose-Capillary: 156 mg/dL — ABNORMAL HIGH (ref 70–99)

## 2011-02-27 LAB — CBC
HCT: 35.3 % — ABNORMAL LOW (ref 36.0–46.0)
Hemoglobin: 11.2 g/dL — ABNORMAL LOW (ref 12.0–15.0)
MCHC: 31.8 g/dL (ref 30.0–36.0)
MCV: 72.1 fL — ABNORMAL LOW (ref 78.0–100.0)
Platelets: 318 10*3/uL (ref 150–400)
RBC: 4.89 MIL/uL (ref 3.87–5.11)
RDW: 16.7 % — ABNORMAL HIGH (ref 11.5–15.5)
WBC: 5 K/uL (ref 4.0–10.5)

## 2011-02-27 LAB — URINE MICROSCOPIC-ADD ON

## 2011-02-27 LAB — BRAIN NATRIURETIC PEPTIDE: Pro B Natriuretic peptide (BNP): 61 pg/mL (ref 0.0–100.0)

## 2011-03-22 ENCOUNTER — Encounter (INDEPENDENT_AMBULATORY_CARE_PROVIDER_SITE_OTHER): Payer: Self-pay | Admitting: Surgery

## 2011-03-22 DIAGNOSIS — R0989 Other specified symptoms and signs involving the circulatory and respiratory systems: Secondary | ICD-10-CM | POA: Insufficient documentation

## 2011-03-22 DIAGNOSIS — J349 Unspecified disorder of nose and nasal sinuses: Secondary | ICD-10-CM | POA: Insufficient documentation

## 2011-03-22 DIAGNOSIS — R238 Other skin changes: Secondary | ICD-10-CM | POA: Insufficient documentation

## 2011-03-22 DIAGNOSIS — R5383 Other fatigue: Secondary | ICD-10-CM | POA: Insufficient documentation

## 2011-03-22 DIAGNOSIS — K449 Diaphragmatic hernia without obstruction or gangrene: Secondary | ICD-10-CM | POA: Insufficient documentation

## 2011-03-22 DIAGNOSIS — K635 Polyp of colon: Secondary | ICD-10-CM

## 2011-03-28 NOTE — H&P (Signed)
NAMEMAISEY, DEANDRADE NO.:  [id number]   MEDICAL RECORD NO.:  [id number]          PATIENT TYPE:  AMB   LOCATION:                                FACILITY:  [facility]   PHYSICIAN:  Janine Limbo, M.D.DATE OF BIRTH:  January 20, 1924   DATE OF ADMISSION:  DATE OF DISCHARGE:                              HISTORY & PHYSICAL   DATE OF SURGERY:  December 05, 2007.   HISTORY OF PRESENT ILLNESS:  Ms. Fanton is an 77 year old female, para  13-0-0- 3, who presents for dilatation and curettage.  The patient has  been followed at the [facility]  of [facility].  The patient was referred to me from  the [facility] at the [facility].  The patient had  a CT scan done that showed a fluid-filled uterus.  She denies vaginal  bleeding and she is not sexually active.  We were unable to do an  endometrial sample in the office because of the stenotic cervix.  The  patient has a history of colon cancer.  She has multiple other medical  problems as well.   DRUG ALLERGIES:  No known drug allergies.   OBSTETRICAL HISTORY:  The patient has had 13 vaginal deliveries and  currently has 13 living children.   PAST MEDICAL HISTORY:  The patient has a history of colon cancer as  mentioned above.  She also has a history of heart disease and she has  been cleared for her surgery by her cardiologist.  She had a stent  placed in her heart in 2008.  She has had surgery on her knees dating to  2002.  The patient also has a history of asthma, hypertension, anemia,  diabetes, and some emotional problems.   CURRENT MEDICATIONS:  1. Aspirin 81 mg each day.  2. Caltrate plus vitamin D.  3. Coreg 25 mg twice a day.  4. Iron 325 mg twice a day.  5. Glipizide 2.5 mg each day.  6. Megace 40 mg per mL and she takes 10 mL each day.  7. Metformin 1000 mg each day.  8. Pantoprazole 40 mg each day.  9. Plavix 75 mg each day.  10.Lisinopril 20 mg each day.  11.Clarinex 5 mg each day.  12.Cozaar 100 mg each day.  13.Zocor 40 mg each day.  14.Micardis each day.  15.Folic acid each day.   SOCIAL HISTORY:  The patient denies cigarette use, alcohol use, and  recreational drug use.   REVIEW OF SYSTEMS:  Noncontributory.   FAMILY HISTORY:  Noncontributory.   PHYSICAL EXAMINATION:  VITAL SIGNS:  Weight is 130 pounds.  HEENT is within normal limits.  CHEST:  Clear.  HEART:  Regular rate and rhythm.  BREASTS:  Without masses.  ABDOMEN:  Nontender.  EXTREMITIES:  Grossly normal.  NEUROLOGIC:  Grossly normal.  PELVIC EXAM:  External Genitalia is normal.  The vagina is normal except  for relaxation.  There is a cystocele and rectocele present.  Cervix is  nontender, but stenotic.  Uterus is normal  size, shape and consistency.  Adnexa no masses and rectovaginal exam confirms.   ASSESSMENT:  1. Fluid in the uterus.  2. History of colon cancer.  3. Stenotic cervix.   PLAN:  The patient will undergo opening of the cervix with dilatation  and curettage of the uterus.  She understands the indications for her  surgical procedure and she accepts the risks of, but not limited to,  anesthetic complications, bleeding, infections, and possible damage to  the surrounding organs.      Janine Limbo, M.D.  Electronically Signed     AVS/MEDQ  D:  11/25/2007  T:  11/26/2007  Job:  [id number]   cc:   Norris Cross, M.D.

## 2011-03-28 NOTE — H&P (Signed)
[name], [name] NO.:  [id number]   MEDICAL RECORD NO.:  [id number]          PATIENT TYPE:  INP   LOCATION:  4713                         FACILITY:  [facility]   PHYSICIAN:  [name] Ramp, MD        DATE OF BIRTH:  03-13-1924   DATE OF ADMISSION:  04/06/2009  DATE OF DISCHARGE:                              HISTORY & PHYSICAL   CHIEF COMPLAINT:  Chest discomfort.   HISTORY OF PRESENT ILLNESS:  [name] is a very pleasant 77 year old  female with a past medical history significant for diabetes mellitus  type 2, hypertension, coronary artery disease, status post a stent  February 2008, and GERD.  She presented to the office today complaining  of an episode of chest tightness yesterday.  She states she got the  sudden onset of tightness under her sternum yesterday while sitting  down.  She became short of breath and nauseous.  She states her stomach  felt very sour.  She took an aspirin, but the tightness still lasted  for approximately 1-1/2 hours.  She states that the tightness subsided,  the discomfort and nausea did not that is still persisting today.  She  states she has not been feeling any pain when I discovered her heart  block just 2 years ago.  She did have a drug-eluting stent placed in her  mid LAD at that time.  She does take Protonix, but states this has not  been helping her discomfort.  She was still nauseated here in the  office, she did vomit 1 time.  She states she has taken all her usual  medications today except her Zocor, which she takes at night.  Of note,  she was seen on May 3 for leg and ankle pain, at that time it seems it  could be likely gout, but she seemed to be tender in her calf and she  did have a history of colon cancer, increasing the possibility of DVT.  Therefore, was started colchicine, but also ordered a D-dimer.  The D-  dimer came back positive at 2.  Venous Doppler were ordered, but the  patient never went to her  appointment.  She states the family member who  took the message regarding the appointment did not give it to her prior  to making the appointment.   After taking one dose of colchicine, her ankle felt much better thus she  did not worry about it at all.   PAST MEDICAL HISTORY:  1. Diabetes mellitus type 2.  2. Hypertension.  3. Hyperlipidemia.  4. Coronary artery disease.  5. GERD.  6. Congestive heart failure.  She follows with Dr. Arminda Resides at [facility].  Her last EF 25-35%, February.  7. History of colon cancer status post resection.  8. Chronic back pain.  9. Iron deficiency anemia.   MEDICATIONS:  1. Aspirin 81 mg daily.  2. Caltrate 600 plus D one tablet twice a day.  3. Carvedilol 12.5 mg b.i.d.  4. Glipizide 5 mg daily.  5. Metformin 1000  mg b.i.d.  6. Plavix 75 mg daily.  7. Zocor 40 mg at night.  8. Ferrous sulfate 325 mg b.i.d.  9. Norvasc 10 mg daily.  10.Hydrochlorothiazide 12.5 mg daily.  11.Fluoxetine 20 mg daily.  12.Albuterol inhaler 2-4 puffs every 4 hours as needed for shortness      of breath.  13.Flovent 100 mcg, dosage is one puff b.i.d.   FAMILY HISTORY:  Noncontributory given the patient's advanced age.   SOCIAL HISTORY:  She denies smoking or alcohol, but does use snuff.  She  loves to cook, and she has extended family in the area.   REVIEW OF SYSTEMS:  As per HPI.   PHYSICAL EXAMINATION:  VITAL SIGNS:  Temperature is 98.4, pulse is 90,  respirations 16, blood pressure 179/88, pulse ox 98% on room air, weight  is 130.6 pounds.  GENERAL:  She is mildly ill-appearing, alert pleasant elderly female,  but in no acute distress.  EYES:  Pupils were equal round and reactive to light.  MOUTH:  Oropharynx is moist.  LUNGS:  She has mildly decreased breath sounds, but she is clear.  HEART:  Normal rate and rhythm without murmur, rub, or gallop.  ABDOMEN:  Soft, nontender.  She has normal bowel sounds.  She is not  tender in the epigastric  region.  PULSES:  2+ radial and DP pulses.  EXTREMITIES:  Trace lower extremity edema.  NECK:  No JVD.   ASSESSMENT AND PLAN:  1. Chest discomfort.  Given the history of known coronary artery      disease, her chest tightness associated symptoms and the fact that      she does not remember that feeling chest pain the last time, I feel      it would be wise to admit her  overnight in the hospital for a      chest pain rule out. .  She is 98% on room air.  Her vitals are      normal.  Her blood pressure is normal for her currently.  At this      time, I will not pursue a D-dimer, Doppler, or CTA unless her      vitals become unstable.  We will obtain cardiac enzymes q.8 hours      x3.  We will continue her on aspirin and beta-blocker and give      nitroglycerin as needed.  She was given Zofran for nausea.  I have      called the cardiologist, Dr. Arminda Resides office for consu;t and asked      that he see her in the morning.  EKG in the office is unchanged      from her previous EKG in December, we will repeat the EKG in the      morning and we will compare to the old EKG in the a.m.  2. For her iron deficiency anemia, we will continue her iron.  3. For her hypertension, she is hypertensive today as she usually is,      we will continue her Coreg, Norvasc, and hydrochlorothiazide here      while admitted.  4. For her diabetes, we will hold her metformin and her glipizide as      she will likely be eating better and less.  She may need      contrasted studies and procedures.  We will cover her with sliding      scale insulin.  Her last A1c was checked here in the  office on May      3 and was 8.0.  There is no need to recheck in the hospital at this      time.  5. For her depression, we will continue fluoxetine.  6. For her gastroesophageal reflux disease, we will continue the      Protonix with GI cocktail prn.  7. Dysuria. Of note, the patient did also complain of some dark and       odorous urine.  Urinalysis here in the office      shows 100 of protein, but negative nitrite and esterase, white      blood cells she has 5-10, bacteria, 3+ rods, and 0-5 epithelial.      We will send this for culture  This will be followed by the primary      team as they will have access to the records on [facility].      [name] Garland, MD  Electronically Signed      [name] Ramp, MD  Electronically Signed    LM/MEDQ  D:  04/06/2009  T:  04/07/2009  Job:  [id number]

## 2011-03-28 NOTE — Op Note (Signed)
[name], [name] NO.:  [id number]   MEDICAL RECORD NO.:  [id number]          PATIENT TYPE:  OIB   LOCATION:  9319                          FACILITY:  [facility]   PHYSICIAN:  Janine Limbo, M.D.DATE OF BIRTH:  Mar 28, 1924   DATE OF PROCEDURE:  12/05/2007  DATE OF DISCHARGE:  12/05/2007                               OPERATIVE REPORT   PREOPERATIVE DIAGNOSES:  1. Pelvic mass (fluid in the uterus).  2. History of colon cancer.   POSTOPERATIVE DIAGNOSES:  1. Hematometra.  2. History of colon cancer.  3. Possible uterine perforation.   PROCEDURES:  1. Hysteroscopy.  2. Ultrasound-guided dilatation and curettage.   SURGEON:  Janine Limbo, MD   FIRST ASSISTANT:  None.   ANESTHETIC:  General.   DISPOSITION:  Ms. [name] is an 77 year old female, para 13-0-0-13, who  presents after a diagnosis of colon cancer and after colon resection.  A  CT scan was performed and the patient was found to have fluid within the  uterus.  She denied postmenopausal bleeding, however.  She is not  sexually active.  The patient has a stenotic cervix and she did not  tolerate an endometrial biopsy in the office.  The patient understands  the indications for her surgical procedure and she accepts the risks of,  but not limited to, anesthetic complications, bleeding, infections, and  possible damage to surrounding organs.   FINDINGS:  The uterus sounded to 7 cm.  The patient was found to have a  small amount of dark red to brown fluid within the uterus, and this was  consistent with a hematometra.  No adnexal masses were appreciated.  There was a question of a perforation, although we are not certain.  The  patient did not have a large fluid deficit during the hysteroscopy and  we could not confirm a perforation using the ultrasound.  However, there  was a question of a perforation based on hysteroscopic findings.  On  hysteroscopy we saw what appeared to be adhesions and  perhaps very small  polyps, but it did not appear consistent with ultrasound findings and  sounding findings.   PROCEDURE:  The patient was taken to the operating room, where a general  anesthetic was given.  The patient's abdomen, perineum, and vagina were  prepped with multiple layers of Betadine.  The patient was then  sterilely draped.  Examination under anesthesia was then performed.  The  bladder was drained of urine.  A paracervical block was placed using 10  mL of 0.5% Marcaine with epinephrine.  An endocervical curettage was  then performed.  We were unable to sound the uterus.  We then used  lacrimal probes to identify the cervical canal.  We then had ultrasound  to help guide the remainder of the procedure.  Because it was difficult  for the ultrasonographer, we filled the bladder with 120 mL of saline.  Under ultrasound guidance we then used a size 3 sound, followed by a  size 5 sound to gently dilate the cervix and to help [place] to identify the  cervical canal.  We dilated the cervix to a size 13.  We then used the  diagnostic hysteroscope to inspect the endometrial cavity.  The patient  was found to have very small polyps and some filmy tissue inside what  appeared to be the uterine cavity.  The fluid deficit for the  hysteroscopy portion of the procedure was 100 mL.  We still noted,  however, that there was an ultrasound finding of fluid within a  calcified cavity consistent with the uterus.  With ultrasound guidance  we then used the size 3 sound to reach the fluid-filled, calcified  cavity.  At that point approximately 8 mL of dark red to brown fluid  began to drain from the fluid collection seen on ultrasound.  This fluid  was then collected and sent for cytology.  We noticed on ultrasound that  the calcified fluid collection was disappearing as the fluid drained.  We then explored the cavity using Randall stone forceps, followed by  very small ring forceps.  The cavity  was then gently curetted.  The  cavity did feel to be intact.  This having been completed, all  instruments were removed.  Repeat examination showed a small, firm  uterus.  No adnexal masses were appreciated.  The bladder was then  drained once again and there was no blood noted from the bladder.  Sponge, needle and instrument counts were correct.  The estimated blood  loss for the procedure was less than 5 mL.  The estimated fluid deficit  was 100 mL.  The patient tolerated her procedure very well.  She was  awakened without difficulty and taken to the recovery room in stable  condition.   FOLLOW-UP INSTRUCTIONS:  The the patient will be given a prescription  for Darvocet and she will take one or two tablets every 4 hours as  needed for pain.  She will take Tylenol as needed for mild to moderate  pain.  We will observe the patient for several hours this afternoon.  Once she is awake and stable, we will be sure she tolerates a regular  diet.  If she wants to stay overnight for observation, we will be happy  to do that.  If she feels strongly about going home, we will let her go  home and then have her come back to the office on Friday for evaluation.      Janine Limbo, M.D.  Electronically Signed     AVS/MEDQ  D:  12/05/2007  T:  12/06/2007  Job:  [id number]   cc:   Francisca December, M.D.  Fax: [phone]   ZHYQMV HQI ONGE, M.D.  Fax: [phone]

## 2011-03-28 NOTE — Discharge Summary (Signed)
[name], [name] NO.:  [id number]   MEDICAL RECORD NO.:  [id number]          PATIENT TYPE:  OIB   LOCATION:  9319                          FACILITY:  [facility]   PHYSICIAN:  Janine Limbo, M.D.DATE OF BIRTH:  03-16-1924   DATE OF ADMISSION:  12/05/2007  DATE OF DISCHARGE:  12/05/2007                               DISCHARGE SUMMARY   ADMISSION DIAGNOSES:  1. Hematometra.  2. Multiple medical problems including hypertension, diabetes, colon      cancer and heart disease.  3. Questionable uterine perforation.   DISCHARGE DIAGNOSES:  1. Hematometra.  2. Multiple medical problems including hypertension, diabetes, colon      cancer and heart disease.  3. Questionable uterine perforation.  4. Vulvar burning.   PROCEDURE:  Ultrasound-guided hysteroscopy with dilatation and  curettage.   HISTORY OF PRESENT ILLNESS:  [name] is an 77 year old female, para  13-0-0-13, who had hysteroscopy with dilatation and curettage today  because of fluid within the uterus.  It turns out that she had a  hematometra.  She tolerated her procedure well.  There was a question of  an uterine perforation and the patient needed to be observed.  Please  see her dictated history and physical exam as well as operative note for  details.   HOSPITAL COURSE:  The patient was brought to the women's unit after  being in the operating room.  She was observed carefully.  She remained  afebrile.  Her vital signs were stable.  She quickly tolerated a regular  diet.  Her postoperative hemoglobin was 11.4 (preoperative hemoglobin  10.9).  Her postoperative white blood cell count was 6300.  Her platelet  count was 219,000.  The patient's abdomen was soft and nontender.  Her  bowel sounds were active.  There are really no signs of a perforation  whatsoever and there are no signs of infection.  The patient was given  the option of being observed in the hospital overnight and she was given  the  option of going home.  She elected to go home.  Her family members  all agreed that it was reasonable for her to go home.  The patient  states that she has had vulvar burning for quite soom time, and she  wants something to make it better.   DISCHARGE MEDICATIONS:  1. The patient will take Percocet as needed for pain.  2. She was given a prescription for Temovate and she will apply the      cream to the vulva once each day after her bath.  3. She will continue all of her preoperative medications.   DISCHARGE INSTRUCTIONS:  1. She will return to the office on December 09, 2007 for a follow up      examination.  2. She will call for temperature greater than 100.4, for nausea, for      vomiting, for abdominal pain or for any complications.      Janine Limbo, M.D.  Electronically Signed     AVS/MEDQ  D:  12/05/2007  T:  12/06/2007  Job:  [id number] 

## 2011-03-28 NOTE — Discharge Summary (Signed)
[name], ECCLESTON NO.:  [id number]   MEDICAL RECORD NO.:  [id number]          PATIENT TYPE:  INP   LOCATION:  4731                         FACILITY:  [facility]   PHYSICIAN:  Pearlean Brownie, M.D.DATE OF BIRTH:  12-03-1923   DATE OF ADMISSION:  07/06/2007  DATE OF DISCHARGE:  07/08/2007                               DISCHARGE SUMMARY   PRIMARY CARE [name]:  [name], M.D.   CONSULTANTS:  None.   PROCEDURES:  None.   REASON FOR ADMISSION:  The patient is an 77 year old female who  presented to the emergency department via EMS with 1-1/2 days of  diaphoresis, nausea and fatigue.  The patient has an extensive past  medical history including coronary artery disease, colorectal cancer.   DISCHARGE DIAGNOSES:  Urinary tract infection, primary.   SECONDARY DIAGNOSES:  1. Colon cancer status post resection systolic.  2. Congestive heart failure ejection fraction 25-35% per last echo in      February 2008.  3. Coronary artery disease status post stent placement.  4. Type 2 diabetes.  5. Gastroesophageal reflux disease.  6. History of gastrointestinal bleed.  7. Gout.  8. Hyperlipidemia.  9. Hypertension.  10.Iron deficiency anemia.  11.History of right renal artery stenosis.   ADMISSION LABS:  Electrolytes sodium 141, potassium 3.9, chloride 111,  bicarb 21, BUN 24, creatinine was elevated at 1.36, glucose was 138,  calcium 9.8, total protein 6.7, albumin was decreased at 3.1, AST of 9.  CBC showed a white blood cell count of 5.2.  Hemoglobin 12.3, hematocrit  38.5, platelets 290, 67% neutrophils on the differential.  A urinalysis  showed small bili, small leukocyte esterase and negative nitrites.  Micro showed rare epithelial cells, 7-10 white blood cells, 3-6 RBCs and  many bacteria.  Point of care cardiac enzymes showed a troponin of less  than 0.02, CK of 37, CK-MB of 0.9.  CT of the head was obtained in the  ER which was stable compared to  prior exam.  No acute intracranial  findings.  Chronic microvascular white matter disease was shown.  Chest  x-ray showed no acute findings and was stable compared to prior exam.  EKG did show T wave inversions in leads 1, AVR, AVL, V1, V2, V3, V4, V5  and V6 but this was unchanged from an EKG dated April 24, 2007,   DISCHARGE MEDICATIONS:  1. Aspirin 81 mg p.o. daily.  2. Calcium plus vitamin D 1 tablet b.i.d.  3. Coreg 25 mg p.o. b.i.d.  4. Ferrous sulfate 325 mg 1 tablet b.i.d.  5. Glipizide 2.5 mg p.o. b.i.d.  6. Pantoprazole 40 mg p.o. daily.  7. Plavix 75 mg p.o. daily.  8. Clarinex 5 mg p.o. daily.  9. Zocor 40 mg p.o. daily.  10.Megace 40 mg per mL, take 10 mL p.o. daily.  11.Spironolactone 25 mg p.o. daily.   The patient was told to stop taking Diltiazem, metformin, lisinopril and  Cozaar until she went to see her PCP following discharge due to  increased creatinine.   HOSPITAL COURSE:  1. Diaphoresis.  The patient came  in complaining of diaphoresis and      fatigue for several days.  Initially our 2 concerns were for a      urinary tract infection given her urinalysis with micro showing      many bacteria and small leukocyte esterase; however, we were also      concerned for possibly a silent MI.  This is in a patient with      known coronary artery disease and an ejection fraction of 25-35%.      Also being a diabetes, we were afraid that she was experiencing an      MI without chest pain.  Cardiac enzymes were checked x3.  They were      all negative.  The patient was placed in a tele bed and did not      have any significant events on tele.  Therefore we focused our      attention to urinary tract infection.  She was started on 1 gram of      Rocephin IV q. 24 hours.  Urine culture came back showing over      100,000 colonies of E. coli.  The patient received 3 days total of      ceftriaxone, which we felt was sufficient to treat an uncomplicated      UTI.  At the  time of discharge, the patient did feel much better      and her fatigue and diaphoresis had resolved.  2. Acute renal failure.  The patient was found to have a creatinine of      1.36 on admission.  Her baseline based on prior notes was about      0.8.  We thought this was probably secondary to hypovolemia.      Partially supported this thought with her hemoglobin of 12.3 where      she usually has a baseline a little bit lower than that.  We      thought she was probably hemo-concentrated, so we gently hydrated      her at 75 mL an hour.  We wanted to be careful given her low      ejection fraction.  Her creatinine did trend down but it was still      elevated on discharged at 1.14.  While she was in house, we stopped      her spironolactone, metformin, Diltiazem, lisinopril and Cozaar.      On discharge we added back in the spironolactone but left off the      other medicines until she could see Dr. Irving Burton and get her      creatinine rechecked and then have him consider re-adding in those      other medicines.  3. Diabetes.  In the hospital we kept her on Glipizide and sliding      scale insulin but held the metformin due to her increased      creatinine.  4. Colon cancer.  The patient is status post resection but has not had      any variation of chemo.  She was scheduled for a colonoscopy with      Dr. Loreta Ave but was in the hospital at the time of the colonoscopy.      We asked her family to call and re-schedule.  5. Congestive heart failure.  The patient has a reported ejection      fraction of 25-35% based on echo of February 2008.  We continued  her on Coreg,  Plavix and aspirin but we did hold her lisinopril      and Cozaar and Diltiazem and discharged her asking her not to take      those medications until she was able to see Dr. Irving Burton.  We held      her spironolactone while she was in house but discharged her on      that medication.  6. Hypertension.  As mentioned  earlier the patient was continued on      Coreg but other medications were held for increased creatinine.      Her blood pressures were actually reasonably well controlled while      in house with pressures from the low 100s up to 148 systolic.  7. Gastroesophageal reflux disease.  The patient was continued on      Protonix.  8. Hyperlipidemia.  The patient was continued on her Zocor.  9. Anemia.  This patient actually came in with a normal hemoglobin,      probably secondary to dehydration, as her baseline is around 10.      Of note, no treatment or action taken for this.  10.Social issues.  The patient's daughter was with her in the ER when      she presented.  They are very concerned about the patient's home      environment.  The patient lives with an adult son and an adult      grandson.  The daughters report that the son and grandson may be      involved with alcohol and drugs and they do not take care of the      patient in terms of her ADLs, medicines, doctors appointments, etc.      That the daughters do all of this.  The patient met with a Equities trader and discussed possibilities of other living situations.      Shortly before discharge, the patient did agree that she was      interested in an assisted living facility.  Social worker filled      out the [employer] and sent out for bed request and was to get in touch      with the patient regarding bed offers and then the patient and her      daughters were going to work out the details of that.   PENDING TEST RESULTS AT THE TIME OF DISCHARGE:  None.   PATIENT'S CONDITION AT THE TIME OF DISCHARGE:  Stable.   DISPOSITION:  The patient was discharged home.   DISCHARGE FOLLOWUP:  The patient is to followup with Dr. Irving Burton at  [facility] on July 12, 2007.   FOLLOWUP ISSUES:  As mentioned previously, the patient was with  increased creatinine and a number of medications were discontinued  temporarily.  This is to be  evaluated by Dr. Irving Burton at her followup  appointment.      Asher Muir, MD  Electronically Signed      Pearlean Brownie, M.D.  Electronically Signed    SO/MEDQ  D:  07/30/2007  T:  07/31/2007  Job:  [id number]

## 2011-03-28 NOTE — H&P (Signed)
[name], DEPASQUALE NO.:  [id number]   MEDICAL RECORD NO.:  [id number]          PATIENT TYPE:  INP   LOCATION:  4731                         FACILITY:  [facility]   PHYSICIAN:  Pearlean Brownie, M.D.DATE OF BIRTH:  April 19, 1924   DATE OF ADMISSION:  07/06/2007  DATE OF DISCHARGE:                              HISTORY & PHYSICAL   PRIMARY CARE Duilio Heritage:  Dr. Irving Burton at family practice center   CHIEF COMPLAINT:  Sweaty and tired.   HISTORY OF PRESENT ILLNESS:  Patient is an 77 year old female with an  extensive medical history including coronary artery disease and  colorectal cancer who presents with 1 1/2 days of diaphoresis, nausea  and fatigue.  The patient reports that she has been feeling slightly  more tired than usual for about one week and that the evening prior to  admission, she became extremely fatigued and diaphoretic to the point  where she had to lie down.  She experienced a similar episode on the  morning of admission.  Her daughter called EMS after the episode this  morning.  Patient denies chest pain, any increase in her shortness of  breath, fever and chills.  She states that during the episode, she did  experience some nausea, but no vomiting.  Also of note, she had no focal  weakness or one-sided neuro symptoms.   REVIEW OF SYSTEMS:  Positive for cough with clear sputum, some  lightheadedness, bilateral knee pain, rhinorrhea, vaginal itching, and  some urinary incontinence that she has at baseline with no changes in  this.  Review of systems was negative for chest pain, increased  shortness of breath.  Of note, patient does get shortness of breath  during her ADLs at baseline, but this is unchanged.  She denies  diarrhea, emesis, PND, orthopnea, melena, bright red blood per rectum,  dysuria, hematuria, fever and chills.  Additionally, patient reports  that her CBGs were not particularly high or low during this episode or  recently.   In the  emergency room, the patient got IV fluids, labs and a chest x-ray  and a CT head.   PAST MEDICAL HISTORY:  1. Back pain.  2. Colorectal cancer status post resection.  She has a followup      hem/onc appointment with Dr. Dalene Carrow on August 12.  3. Systolic CHF.  She was admitted in February of 2008 for an      exacerbation.  Echo showed ejection fraction of 25 to 35%.  4. Coronary artery disease status post stent placement in February of      2008.  5. Diabetes type 2.  6. GERD followed by Dr. Loreta Ave.  7. Gout.  8. Hyperlipidemia.  9. Hypertension.  10.Iron deficiency anemia with baseline hemoglobin of around 10.  11.Right renal artery stenosis according to cath report February of      2008.   MEDICATIONS:  1. Aspirin 81 mg one p.o. daily.  2. Caltrate plus D one tablet b.i.d.  3. Coreg 25 mg p.o. b.i.d.  4. Ferrous sulfate 325 mg p.o. b.i.d.  5. Glipizide 2.5 mg  p.o. daily.  6. Megace 40 mg/mL, takes 10 mL daily.  7. Metformin 1000 mg p.o. daily.  8. Pantoprazole 40 mg p.o. daily.  9. Plavix 75 mg p.o. daily.  10.Spironolactone 25 mg p.o. daily.  11.Lisinopril 20 mg p.o. daily.  12.Clarinex 5 mg p.o. daily.  13.Cozaar 100 mg p.o. daily.  14.Zocor 40 mg p.o. daily.  15.Diltiazem CR 180 mg p.o. daily, started on August 1.  16.Additionally, another med list that the patient's daughter brought      with her shows an order for Xeloda 500 mg daily to start when the      patient starts radiation treatment.   PAST SURGICAL HISTORY:  1. Low-anterior resection of the colon in January of 2008.  2. Coronary stent of the LAD in February of 2008.  3. Total right knee placement in 2002.  4. Breast lumpectomy approximately 20 years ago.   SOCIAL HISTORY:  Patient lives in her home with one son and a grandson  all adult.  She dips snuff, no alcohol or illicit drugs.  Contact person  is Lin Landsman at [phone].   ALLERGIES:  No known drug allergies.   PHYSICAL EXAMINATION:  VITAL  SIGNS:  Temperature 99.5, pulse 88,  respirations 20, blood pressure 145/78, O2 100% on room air.  GENERAL:  Patient is in no acute distress, appears cold lying under four  to five blankets, seems alert and oriented and able to give a reasonable  history.  HEENT:  Normocephalic, atraumatic.  Pupils equally round and reactive to  light.  Extraocular movements intact.  No cervical lymphadenopathy.  Oropharynx clear.  Darkened tongue, probably secondary to snuff use.  Moist mucous membranes.  CARDIOVASCULAR:  Patient was slightly tachycardic, but regular rhythm.  No murmurs, rubs or gallops detected.  PULMONARY:  Clear to auscultation bilaterally.  No wheezes, rales or  rhonchi detected.  ABDOMEN:  Soft, nontender, nondistended.  Positive bowel sounds.  No  hepatosplenomegaly detected.  EXTREMITIES:  Somewhat decreased sensation in the right foot, but normal  in the left.  2+ DP pulses on right, 1+ DP pulses on left.  4/5 strength  bilaterally in the lower extremities.   LABORATORY VALUES:  Labs obtained in the ER include electrolytes which  showed sodium 141, potassium 3.9, chloride 111, bicarb 21, BUN 24,  creatinine 1.36, glucose 138.  CBC showed white blood cell count of 5.2,  hemoglobin 12.3, hematocrit 38.5, platelets 290.  Additionally, calcium  9.8, total protein 6.7, albumin 3.1, AST 9.  UA showed small bilirubin,  small leukocyte esterase, negative nitrite.  Micro showed rare  epithelial cells, 7-10 white blood cells, 3-6 red blood cells, many  bacteria; this was a cath UA.  Cardiac enzymes in the ER showed troponin  less than 0.02, CK of 37, CK-MB of 0.9.   CT of the head was stable compared to prior exam with no acute  intracranial findings, chronic microvascular white matter disease was  noted.   Chest x-ray stable compared to prior exam with no acute findings.   EKG showed T wave inversions in lead 1, aVR, aVL, V1, V2, V3, V4, V5 and  V6; this was unchanged from EKG  dated April 24, 2007.   ASSESSMENT/PLAN:  Patient is an 77 year old female with coronary artery  disease, colorectal cancer, and multiple medical problems admitted for  diuresis and fatigue.   1. Diuresis and fatigue.  The two major likely causes include urinary      tract infection versus  a myocardial infarction.  Patient's cath      urinalysis showed small leukocyte esterase, 7-10 white blood cells      and many bacteria.  Will check a urine Gram-stain and culture.      Patient's family reports frequent urinary tract infections with      this patient.  Patient is now afebrile and no increased white blood      cell count.  Will start Rocephin 1 gm IV q.24 hours to cover for      Escherichia coli; this will also cover for some Gram-positives.      Will narrow the spectrum when culture and sensitivities come back.      Another possible cause of the patient's symptoms is a myocardial      infarction.  Patient is with known coronary artery disease with an      ejection fraction of 25 to 35%.  She has three-vessel disease on      cath in February of 2008, but was not a candidate for coronary      artery bypass grafting.  Only one of the three vessels was stented      according to the cath report.  Patient did not complain of chest      pain or any increase in her baseline shortness of breath, however      as a diabetic she is more likely to experience a silent myocardial      infarction.  Her EKG, however, showed T wave inversions in most      leads, but that was unchanged from an EKG in June of 2008.      Additionally, her point-of-care cardiac enzymes were negative.  So,      this seems the less likely cause of her symptoms, but nevertheless      will check cardiac enzymes x3 and place her in a tele bed until she      is ruled out for myocardial infarction.  2. Acute renal failure.  Patient was noted to have a creatinine of      1.36 on admission labs; her baseline is around 0.8.  This  acute      decrease in her renal function is probably secondary to      hypovolemia.  Although the patient appears fairly euvolemic on      exam, she does state that she has been drinking almost no water for      several days and she is on a diuretic and multiple other blood      pressure medicines.  We will gently hydrate her with normal saline      at 75 mL an hour, but because of her low ejection fraction, we will      be careful to recheck her volume status in the morning and not to      over-hydrate her.  We will also recheck a creatinine in the morning      and then decide on where to go with her fluids from there.  3. Diabetes.  Patient is on metformin and glipizide at home.  Will      continue glipizide in-house, but will hold the metformin for now      because of her increase in creatinine.  Will start her on sliding-      scale insulin for the sensitive track.  4. Colon cancer.  Patient is status post resection, but no radiation      or chemo yet.  She is scheduled for a colonoscopy on Monday.  If      she is still here on Monday morning, will need to contact Dr. Loreta Ave,      her GI doctor.  5. Systolic congestive heart failure.  Patient with an ejection      fraction of 25 to 35% according to an echo in February of 2008.      Will continue Coreg, Plavix and aspirin, but for now will hold      lisinopril, spironolactone, Cozaar and Diltiazem in light of the      patient's kidney function.  Will also watch her fluid status      carefully.  6. Hypertension.  As mentioned above, will continue Coreg, but will      hold other medicines and monitor blood pressures.  We may need to      add back in other medicines judiciously.  7. Gastroesophageal reflux disease.  Will continue Protonix.  8. Hyperlipidemia.  Will continue Zocor.  9. Anemia.  Patient's hemoglobin was actually within normal limits on      admission.  This is probably secondary to dehydration.  Her      baseline  hemoglobin is around 10.  We will monitor her CBC.  10.Back pain.  Will give Tylenol p.r.n.  11.Social.  Patient's daughters present to the ER.  The patient      currently lives in her own home with her adult son and adult      grandson who may be involved with illicit drugs and alcohol.      Daughters report that the son and grandson do not care for the      mother in terms of her ADLs, medications, appointments, etc.  The      daughters would like the patient to move in with one of them, but      the patient expresses resistance.  It is also possible that the      patient is depressed; will try to bring up the issue of depression      with the patient and question her regarding that.  A family meeting      may be necessary at some point if the family cannot come to an      agreement on their own as to where the mom is to be discharged home      to.  Additionally, a physical therapy/occupational therapy consult      was ordered to assess the patient's safety and ability to move      around her home.  May consider a social work consult as well, but      one is not ordered at this time.      Asher Muir, MD  Electronically Signed      Pearlean Brownie, M.D.  Electronically Signed    SO/MEDQ  D:  07/06/2007  T:  07/07/2007  Job:  [id number]

## 2011-03-31 NOTE — Op Note (Signed)
[name], [name] NO.:  [id number]   MEDICAL RECORD NO.:  [id number]          PATIENT TYPE:  INP   LOCATION:  2550                         FACILITY:  [facility]   PHYSICIAN:  Cherylynn Ridges, M.D.    DATE OF BIRTH:  02-16-1924   DATE OF PROCEDURE:  11/16/2006  DATE OF DISCHARGE:  09/25/2006                               OPERATIVE REPORT   PREOPERATIVE DIAGNOSIS:  Rectal cancer.   POSTOPERATIVE DIAGNOSIS:  Rectal cancer.   PROCEDURE:  Low anterior resection.   SURGEON:  Marta Lamas. Lindie Spruce, M.D.   ASSISTANT:  Wilmon Arms. Tsuei, M.D.   ANESTHESIA:  General endotracheal.   ESTIMATED BLOOD LOSS:  Less than 50 mL.   COMPLICATIONS:  None.   CONDITION:  Stable.   FINDINGS:  The patient had a low rectal tumor approximately 5 cm from  the anal verge.  We were able to get good margins of resection at least  2 cm from the staple line.  There was no evidence of liver spread.  There was no adenopathy of the retroperitoneum.  The stomach and spleen  and liver all appeared fine.  There were no gallstones.   INDICATIONS FOR OPERATION:  The patient is an 77 year old with rectal  bleeding who was colonoscoped and found to have a large rectal polyp  with adenocarcinoma on biopsy.   OPERATION:  The patient was taken to the operating room and placed on  the table in the supine position.  After an adequate endotracheal  anesthetic was administered, she was placed in lithotomy and then  prepped and draped in the usual sterile manner, exposing her abdomen and  also prepping her rectal and peroneal area for the ventral rectal  anastomosis.   We made a lower midline incision from the pubic crest up to and just to  the left of the umbilicus.  We took it down to and through the midline  fascia.  We entered the peritoneal cavity where there appeared to be  some adhesions of the uterus to the sigmoid colon, but these were very  flimsy and were able to be taken with electrocautery.  We  examined the  abdomen visually and tactilely.  We were able to palpate no evidence of  abnormality of the liver, gallbladder, spleen, stomach, small bowel.  We  ran the small bowel all the way and found there to be no abnormalities.  There were no gallstones.   As we palpated down into the true pelvis, we could palpate the rectal  tumor.  We were able to lift up and dissect out the distal sigmoid colon  from the pelvic brim and dissected away from the sidewalls at the line  of Toldt.  It was at that point that we came across it using a GIA 55  stapler.  We dissected the left part of the sigmoid colon __________  identify in the retroperitoneum the left ureter very well.  The right  ureter was left up towards the right side wall and was not injured  during the process.  We came along the posterior  aspect of the distal  sigmoid and then the proximal rectum using a LigaSure device in order to  obtain adequate hemostasis.  This allowed the scope retroperitoneally in  a presacral manner, identifying blood vessels as we went along.  Some  ligatures of 2-0 silk were also necessary.   We opened up down into the true pelvis and then scored the peritoneum  overlying the anterior portion of the rectum at that point.  We could at  that point also palpate the rectal tumor and were able to get below it  easily with a Contour blue 3.5-mm closure stapling device.   We cleaned out the rectum and the distal sigmoid circumferentially, and  we were able to get below the tumor easily with at least a 3 to 4-cm  margin.  This was with stretching of the colon.  We came across it with  the Contour.   The proximal sigmoid colon which was to be anastomosed to the rectal  stump was cleaned off, and then we used a pursestring device and a Keith  needle with 3-0 Prolene in order to prepare that for the anastomosis.  After we passed the Stow needle, we cut off the distal portion of the  sort of proximal sigmoid  colon.  I used sizers in order to identify the  appropriate size EEA device to use.  We decided upon a 29-mm device.  We  opened the device on the sterile field, opened the stapling device,  removed the anvil and placed it in the proximal colon, and tied it down  with the pursestring suture.  We then had our assistant, Dr. Corliss Skains, go  down to the perineum where he was able to identify the anorectal area  easily.  He was able to palpate and actually get his finger up to the  actual staple line where he then subsequently passed a Premium EIA  stapler with the pointed tip retracted.  Once we were in the appropriate  spot, we  opened up the spike of the EEA, came through the staple line  just anterior to the staple line, and then passed the anvil which was  secured to the proximal colon and closed down the EEA.  It was fired and  subsequent donuts on the proximal distal colon showed complete circles.  We tested the anastomosis for leak using air and saline fell into the  pelvis and no leak was noted.  Dr. Corliss Skains was also able to directly look  at the suture line with the rigid sigmoidoscope where no breaks in the  anastomoses were noted.   We opened the specimen off of the field, and I will describe as we  opened up came across it along the wall close to the tumor.  There was a  good distal margin at the staple line.  However, as we cut open the  specimen, it came close to the cut edge and therefore it may seem as  though margin was close to it.  However, the staple line was at least 2  cm away.   Once we opened the specimen, we changed gloves and gown.  Dr. Corliss Skains  changed his gloves prior to coming back up to help close the abdomen.  We irrigated with saline.  There was minimal bleeding in the pelvis.  We  closed the fascia using a running #1 PDS suture.  The subcutaneous  tissue was irrigated with saline, and the skin was closed using stainless steel staples.  All needle, sponge counts and  instrument  counts were correct.      Cherylynn Ridges, M.D.  Electronically Signed     JOW/MEDQ  D:  11/16/2006  T:  11/16/2006  Job:  [id number]

## 2011-03-31 NOTE — Discharge Summary (Signed)
NAMEAHRIA, SLAPPEY NO.:  [id number]   MEDICAL RECORD NO.:  [id number]          PATIENT TYPE:  INP   LOCATION:  4713                         FACILITY:  [facility]   PHYSICIAN:  [name] McDiarmid, M.D.DATE OF BIRTH:  1924/07/15   DATE OF ADMISSION:  04/06/2009  DATE OF DISCHARGE:  04/07/2009                               DISCHARGE SUMMARY   PRIMARY CARE Fadi Menter:  Ardeen Garland, MD   PRIMARY CARDIOLOGIST:  Francisca December, MD   DISCHARGE DIAGNOSES:  1. Chest pain.  2. Gastroesophageal reflux disease.  3. Diabetes mellitus, type 2.  4. Hypertension.  5. Coronary artery disease, three-vessel disease nonoperable.  6. Dysuria.  7. Dyslipidemia.  8. History of gastrointestinal hemorrhage.  9. Iron-deficiency anemia secondary to colon cancer.  10.Back pain secondary to L2 compression fracture.  11.Gout.  12.Depression.   DISCHARGE MEDICATIONS:  1. Aspirin 81 mg p.o. daily.  2. Glipizide 5 mg p.o. daily.  3. Calcium and vitamin D 600/400 mg p.o. b.i.d.  4. Carvedilol 12.5 mg p.o. b.i.d.  5. Metformin 1000 mg p.o. b.i.d.  6. Plavix 75 mg p.o. daily.  7. Zocor 40 mg p.o. daily.  8. Ferrous sulfate 325 mg p.o. b.i.d.  9. Norvasc 10 mg p.o. daily.  10.Hydrochlorothiazide 12.5 mg daily.  11.Fluoxetine 20 mg p.o. daily.  12.Insulin 2-4 puffs q.4 h. p.r.n. shortness of breath.  13.Flovent 2 puffs inhaled b.i.d.  14.Colchicine 0.6 mg t.i.d. p.r.n.  15.Cetirizine 10 mg p.o. daily.  16.Nexium 40 mg p.o. b.i.d. x4 weeks or the patient may substitute      Protonix 40 mg p.o. b.i.d. x4 weeks.  17.Zofran 4 mg p.o. q.6 h. p.r.n. nausea.   DISCONTINUED MEDICATIONS:  None.   CONSULTS:  None.   PROCEDURES:  None.   IMAGING:  EKG, normal sinus rhythm.  No ST elevation or depression.  Heart rate 60.   DISCHARGE LABS:  Cardiac enzymes negative x3.  Fasting lipid panel,  total cholesterol 131, triglycerides 64, HDL 18, LDL 100.  BMET,  potassium 3.8, creatinine 0.83,  hemoglobin A1c 7.4.  CBC, white count  5.0, hemoglobin 9.6, hematocrit 29.6, platelets 192.   BRIEF HOSPITAL COURSE:  An 77 year old female with history of diabetes  mellitus, hypertension, coronary artery disease status post stent in  2008 admitted directly from primary care physician's office secondary to  episode of chest pain on the night prior associated with diaphoresis  secondary to the patient's history as per above, admitted for rule out  myocardial ischemia, although chest pain described was atypical status  post meal consisting of large amount of starch and spicy foods.   1. Chest pain.  Per above, the patient was admitted to telemetry for      rule out myocardial ischemia secondary to history of coronary      artery disease as well as other risk factors including hypertension      and diabetes mellitus.  Chest pain was atypical in nature, most      likely related to some type of gastrointestinal etiology such as      reflux as  the patient describes sour stomach as well as burning      sensation in the mid chest with associated nausea and brash water      taste in mouth.  Telemetry did not show any abnormal arrhythmias      overnight.  Cardiac enzymes negative x3 and EKG did not show any      evidence of myocardial ischemia.  The patient was started on      Protonix IV b.i.d. and continued on Plavix, beta-blocker, and      heparin subcu.  The patient's primary cardiologist was consulted on      admission; however, was unable to see the patient during admission,      therefore, after concern by the patient as well as family the      patient's chest pain had resolved prior to going to primary care      Tavionna Grout's office and the patient had negative enzymes, decision      was made the patient would see primary cardiologist shortly      thereafter discharge.  The patient did not have any chest pain at      discharge; however, did have an episode of nausea, which was      relieved  with Zofran the night before discharge.  Primary team felt      that this is most likely due to indigestion or GERD symptoms,      therefore, the patient's PPI was increased to b.i.d. dosing for the      next 4 weeks.  As noted continued high dose PPI leads to      osteoporosis and the patient is already elderly with some blood      loss.  The patient will follow up with cardiologist as per above      for any further outpatient testing.  2. Hypertension.  The patient was continued on home medications.      Blood pressures ranged from 140s-160s; however, no change was made      in the patient's regimen.  3. Diabetes mellitus.  The patient's metformin was held on admission      secondary to any intervention as needed by Cardiology.  The patient      was given sliding scale insulin at discharge.  The patient will      continue home medications of metformin and glipizide.  4. Coronary artery disease.  The patient will continue Plavix,      aspirin, beta-blocker.  Of note, the patient is allergic to ACE      inhibitors, therefore these were not used in this patient.  5. Depression.  The patient was continued on dose of Prozac.  6. Dyslipidemia.  Fasting lipid panel was drawn.  The patient is      currently on Zocor 40 mg daily.  We will forward fasting lipid      panel to primary care physician for any changes in statin.   ISSUES FOR FOLLOWUP:  1. Primary cardiology visit for any further outpatient testing      suggestive echocardiogram or stress Myoview.  2. Follow up on GERD symptoms and decrease of PPI to 40 mg daily after      4 weeks.  3. The patient has history of dysuria.  In clinic, urine culture was      pending at time of discharge.  This will be followed up by primary      care physician and treatment as needed.  Did not  complain of any      symptoms during admission, and the patient was afebrile.   DISCHARGE INSTRUCTIONS:  Heart-healthy diet.  The patient may walk  without any  restrictions or use of cane as needed.  The patient is to  eat within 1 hour before going to bed, sit up for at least 30 minutes  after eating, avoid spicy foods.   FOLLOWUP APPOINTMENTS:  1. Dr. Georgiana Shore, the patient will call for followup appointment.  2. Dr. Amil Amen, Cardiology April 13, 2009, 10:30 a.m.   DISCHARGE CONDITION:  Stable/improved.   DISCHARGE LOCATION:  Home.      Milinda Antis, MD  Electronically Signed      [name] McDiarmid, M.D.  Electronically Signed    KD/MEDQ  D:  04/10/2009  T:  04/11/2009  Job:  [id number]   cc:   Ardeen Garland, MD  Francisca December, M.D.

## 2011-03-31 NOTE — Procedures (Signed)
[name]  Patient:    [name], [name]                        MRN: [id number] Proc. Date: 01/12/00 Adm. Date:  [id number] Attending:  Charna Elizabeth CC:         Carey Bullocks, M.D.                           Procedure Report  DATE OF BIRTH:  April 16, 1924.  REFERRING PHYSICIAN:  Carey Bullocks, M.D.  PROCEDURE PERFORMED:  Esophagogastroduodenoscopy with injection of epinephrine nto an esophageal tear.  ENDOSCOPIST:  Anselmo Rod, M.D.  INSTRUMENT USED:  INDICATIONS FOR PROCEDURE:  Dysphagia in a 77 year old black female found to have a tight stricture on barium swallow.  Dilatation planned if needed.  The patient lso has guaiac positive stools.  PREPROCEDURE PREPARATION:  Informed consent was procured from the patient.  The  patient was fasted for eight hours prior to the procedure.  PREPROCEDURE PHYSICAL:  The patient had stable vital signs except for significant hypertension with blood pressure 213/104.  Neck supple.  Chest clear to auscultation.  S1, S2 regular.  Abdomen soft with normal abdominal bowel sounds.  DESCRIPTION OF PROCEDURE:  The patient was placed in left lateral decubitus position and sedated with 40 mg of Demerol and 5 mg of Versed intravenously. Once the patient was adequately sedated and maintained on low-flow oxygen and continuous cardiac monitoring, the Olympus video panendoscope was advanced through the mouthpiece, over the tongue, into the esophagus under direct vision.  The proximal esophagus appeared normal.  There was a tight stricture seen at the GE junction. The scope was passed through the stricture with significant difficulty resulting in a small tear in the distal esophagus at the GE junction.  This area was bleeding and therefore 6 cc of epinephrine were injected around the tear to achieve hemostasis.  Hemostasis was achieved which was confirmed by repeated washes. On advancing the scope  into the stomach, there was diffuse gastritis throughout the gastric mucosa.  No frank ulcers were seen.  The pylorus seemed somewhat "tight." The scope was passed through this point with some difficulty as well.  There was some bleeding from the pylorus after passage of the scope.  The proximal small bowel appeared normal.  No abnormalities were seen in the proximal small bowel p to 7 cm.  IMPRESSION: 1. Tight stricture gastroesophageal junction that seemed to have dilated with    the passage of the scope, complicated by small esophageal tear injected with    6 cc of epinephrine to achieve hemostasis. 2. Severe diffuse gastritis. 3. "Tight" stricture of pylorus.  Question patients history of nonsteroidal use. 4. Normal proximal small bowel.  RECOMMENDATION: 1. Patient is being started on Aciphex 20 mg 1 p.o. q.d. 2. She has strongly been advised to refrain from the use of all nonsteroidals    including aspirin. 3. Soft diet is to be maintained for the next three to four days. 4. Proceed with colonoscopy at this time to further evaluate the guaiac positive    stools. 5. Outpatient follow-up in the next seven to 10 days. DD:  01/12/00 TD:  01/12/00 Job: [id number]

## 2011-03-31 NOTE — Op Note (Signed)
[name], [name]                 ACCOUNT NO.:  [id number]   MEDICAL RECORD NO.:  [id number]          PATIENT TYPE:  AMB   LOCATION:  ENDO                         FACILITY:  [facility]   PHYSICIAN:  Anselmo Rod, M.D.  DATE OF BIRTH:  13-Jun-1924   DATE OF PROCEDURE:  10/17/2006  DATE OF DISCHARGE:  10/17/2006                               OPERATIVE REPORT   PROCEDURE PERFORMED:  Colonoscopy with multiple core biopsies.   ENDOSCOPIST:  Anselmo Rod, M.D.   INSTRUMENT USED:  Olympus video colonoscope.   INDICATIONS FOR PROCEDURE:  An 77 year old African-American female with  a history of rectal bleeding, undergoing colonoscopy to rule out colonic  polyps, masses, etc.   PRE-PROCEDURE PREPARATION:  Informed consent was procured from the  patient.  The patient was fasted for eight hours prior to the procedure  and prepped with a gallon of TriLyte the night prior to the procedure.  Risks and benefits of the procedure, including a 10% missed rate of  cancer and polyps, were discussed with the patient as well.   PRE-PROCEDURE PHYSICAL EXAMINATION:  VITAL SIGNS:  Stable vital signs.  NECK:  Supple.  CHEST:  Clear to auscultation.  HEART:  S1 and S2.  Regular.  ABDOMEN:  Soft with normal bowel sounds.   DESCRIPTION OF PROCEDURE:  The patient was placed in the left lateral  decubitus position and sedated with 75 mcg of fentanyl and 7.5 mg of  Versed given intravenously in slow, incremental doses.  Once the patient  was adequately sedated, maintained on low flow oxygen, and continuous  cardiac monitoring, the Olympus video colonoscope was advanced from the  rectum to the cecum.  The appendicle orifice and ileocecal valve were  clearly visualized and photographed.  A large vascular mass was noted at  10 cm at the rectosigmoid colon.  Multiple biopsies were done.  The rest  of the exam was unremarkable.  Retroflexion of the rectum revealed no  abnormalities.  The patient tolerated the  procedure well without  immediate complications.   IMPRESSION:  Large apple core lesion seen at 10 cm.  Biopsies done.  Otherwise normal examination up to the cecum.   RECOMMENDATIONS:  1. Await pathology results.  2. Check CEA levels today.  3. CT scan of the abdomen and pelvis will be done to rule out      metastatic disease.  4. Surgical evaluation with Dr. Lindie Spruce, who is aware of the findings on      the colonoscopy today.  5. Further recommendation to be made in followup once the biopsy      results have been procured.      Anselmo Rod, M.D.  Electronically Signed     JNM/MEDQ  D:  10/19/2006  T:  10/19/2006  Job:  [id number]   cc:   Sibyl Parr. Darrick Penna, M.D.  Cherylynn Ridges, M.D.

## 2011-03-31 NOTE — Discharge Summary (Signed)
NAMECHARL, WELLEN NO.:  [id number]   MEDICAL RECORD NO.:  [id number]          PATIENT TYPE:  INP   LOCATION:  5702                         FACILITY:  [facility]   PHYSICIAN:  Cherylynn Ridges, M.D.    DATE OF BIRTH:  [birth year]   DATE OF ADMISSION:  11/16/2006  DATE OF DISCHARGE:  11/22/2006                               DISCHARGE SUMMARY   DISCHARGE DIAGNOSIS:  Colorectal adenocarcinoma with benign lymph nodes,  but a T3 lesion invading into and through the muscularis purpura.   PRINCIPAL PROCEDURE:  Low anterior resection.   ADDITIONAL DIAGNOSES:  Included hypertension, for which she was taking  metoprolol 50 mg twice a day.  She also took Lipitor 80 mg once a day.   DIET ON DISCHARGE:  Was a no restricted, unrestricted regular diet and  she was able to ambulate and go up stairs.   She was discharged to home on iron tablets and multivitamins.  She was  to resume her preoperative medication.  She was discharged on Darvocet-N  100 since she had a follow up with oncology post-discharge.   BRIEF SUMMARY OF HOSPITAL COURSE:  The patient is an 77 year old who is  admitted with a rectal cancer noted on biopsy done by Dr. Charna Elizabeth.  She came in for a low anterior resection, which she underwent on the 4th  of January.  Postoperatively, she had initially done well.  Her  hemoglobin dropped to 8.9.  Her white blood cell count was 9.5.  She was  afebrile.  Postop day number 2, she had a fever up to 102, which came  down to the normal area.  Postop day number 3, she had a maximum temp of  99.7.  She did not pass any gas.  She had a few bowel sounds, but no  bowel movement and no flatus.  Labs were pending.  Her hemoglobin had  dropped to 7.8 on postop day number 4.  She was given 1 unit of packed  blood cells.  Her diet was increased and she continued to improve.  Postop day number 5, her hemoglobin had come up to 9.8.  She was started  on iron and vitamins.  She was  discharged home on postop day number 6,  doing well.  She had had some problems with hypertension, which is  controlled with her normal medicines and a Catapres patch.  She was  discharged home in the care of her family and will followup to see me in  1 week.      Cherylynn Ridges, M.D.  Electronically Signed     JOW/MEDQ  D:  01/10/2007  T:  01/10/2007  Job:  [id number]

## 2011-03-31 NOTE — H&P (Signed)
[name], [name] NO.:  [id number]   MEDICAL RECORD NO.:  [id number]          PATIENT TYPE:  INP   LOCATION:  3706                         FACILITY:  [facility]   PHYSICIAN:  Drue Dun, M.D.       DATE OF BIRTH:  04-06-1924   DATE OF ADMISSION:  08/18/2006  DATE OF DISCHARGE:                                HISTORY & PHYSICAL   PRIMARY CARE PHYSICIAN:  [name] John T. Pickard II, MD   CHIEF COMPLAINT:  Chest pain, rule out myocardial infarction.   HISTORY OF PRESENT ILLNESS:  This is an 77 year old African American female  with multiple medical problems who reports sudden onset of substernal chest  pain around noon today.  She describes the chest pain as non-radiating but  as a heavy pressure that was worsened with exertion.  She denied associated  shortness of breath, diaphoresis or nausea.  The patient did report eating a  large fatty meal about one hour prior to the onset of the pain.  She had her  daughter come and call 911 around 1 or 2 p.m. this afternoon.  The patient  arrived in the Emergency Department around 2:30 p.m.  On ambulance in route  to the hospital the patient's pain was relieved partially with a single  sublingual nitroglycerin tablet and then completely with a second  nitroglycerin tablet.  The chest pain did not return subsequently and is not  present on exam in the Emergency Department.  The patient denies ever having  chest pain like this before.  She also of note reports that she ran out of  her Prilosec about one week ago.  Since then she has had increased burping,  bloatedness and gas.  She reports taking all of her other medications as  prescribed.   PAST MEDICAL HISTORY:  1. Significant for type 2 diabetes.  2. Anemia.  3. Depressive disorder.  4. Hypertension.  5. Gastroesophageal reflux disease.  6. Asthma.  7. Hyperlipidemia.  8. Arthritis.  9. Diverticulosis.  10.Gout.  11.The patient is status post total knee replacement in  2001.  12.The patient is status post esophageal tear in 2001.  13.The patient had 2D echocardiogram in 2006 which revealed a normal      ejection fraction.   HOME MEDICATIONS:  1. Aspirin 325 mg p.o. daily.  2. Glucophage 1000 mg p.o. b.i.d.  3. Lipitor 40 mg p.o. daily.  4. Mobic 7.5 mg p.o. daily as needed.  5. Norvasc 10 mg p.o. daily.  6. Reglan 10 mg p.o. every 8 hours as needed for nausea.  7. Lasix 20 mg p.o. as needed for swelling.  8. Metoprolol 50 mg p.o. b.i.d.  9. Albuterol MDI 2 puffs inhaled every 4 hours as needed.  10.Flovent 220 mcg two puffs inhaled b.i.d., however the patient is using      this medicine currently p.r.n.  11.Prilosec 40 mg p.o. daily.   ALLERGIES:  LISINOPRIL causes a dry cough.   FAMILY HISTORY:  The patient's older sister died of gastric cancer and also  had type 2 diabetes.  The  patient denies any family history of myocardial  infarction or stroke.  The patient does report that her grandchildren have  sickle cell disease.   SOCIAL HISTORY:  The patient is single and lives with her daughter and  grandson.  She has 13 children.  She denies smoking, alcohol or drug use but  does dip snuff daily.  The patient is currently retired.   REVIEW OF SYSTEMS:  CONSTITUTIONAL:  The patient denies fever, chills or  weight changes.  CARDIOVASCULAR:  The patient denies palpitations.  PULMONARY:  The patient denies cough, sore throat or rhinorrhea.  GASTROINTESTINAL:  The patient does complain of gas, bloating and burping  but denies nausea or vomiting or diarrhea.  GENITOURINARY:  The patient  denies dysuria or hematuria.  SKIN:  The patient denies rash or bruising.  NEUROLOGIC:  The patient denies numbness, tingling or weakness.   PHYSICAL EXAMINATION:  VITAL SIGNS:  Reveal a temperature of 98.7, heart  rate of 86, respiratory rate of 20, blood pressure 144/72.  Oxygen  saturation 98% on room air.  GENERAL:  This is an awake and alert female no acute  distress, alert and  oriented x3.  HEAD:  Normocephalic and atraumatic.  Pupils are equal, round and reactive  to light and accommodation.  Extraocular movements intact.  Conjunctivae are  clear bilaterally.  Oropharynx is without erythema or exudate.  PULMONARY:  Lungs are clear to auscultation bilaterally with normal work of  breathing and no wheezes, rales or rhonchi.  CARDIOVASCULAR:  Heart is regular rate and rhythm with no murmurs, gallops  or rubs.  No carotid bruits.  1+ dorsalis pedis pulses bilaterally.  ABDOMEN:  Hyperactive bowel sounds, soft, nontender, mildly distended and  without hepatosplenomegaly.  EXTREMITIES:  No cyanosis, clubbing or edema.  NEUROLOGIC:  Grossly intact and nonfocal.  Strength is 5/5 bilaterally.  SKIN:  No rash or lesions.  There is no cervical adenopathy.   LABS AND TESTS:  BMP reveals a sodium of 139, potassium 3.6, chloride 107,  bicarb 23, BUN 9, creatinine 0.9, glucose 109, calcium 9.6.  Total protein  is 7.4, albumin is 3.5, AST is 18, ALT is 10, alkaline phosphatase is 66, T  bili of 0.3.  Point of care cardiac enzymes reveal myoglobin of 72.7 which  is normal.  CK-MB of 2.3 which is normal and troponin of less than 0.05  which is normal.  CBC revealed a white count of 5.6, hemoglobin of 10.6,  hematocrit of 32.9, platelets of 274.  Differential revealed 62%  neutrophils, 30% lymphocytes, 5% monocytes and 2% eosinophils.  EKG revealed  normal sinus rhythm with normal axis, good R wave progression, no ST  depression or elevation.  Isolated T wave inversion in V3 but no evidence of  acute ischemia.  Chest x-ray was negative for acute cardiopulmonary  processes.   ASSESSMENT/PLAN:  This is an 77 year old African American female with  multiple medical problems and new sudden onset chest pain with stable vital  signs.  1. Chest pain.  Most likely noncardiac in origin given normal cardiac     enzymes and normal EKG.  Gastroesophageal reflux  etiology is very      possible given associated symptoms of bloating, burping and gas.      However given patient's history of diabetes, hyperlipidemia and      hypertension and the nature of the pain increasing with exertion, her      chest pain is somewhat concerning for cardiac origin.  The patient will      be admitted to telemetry bed for 23 hour observation with repeat of      cardiac enzymes x3 and repeat EKG in the morning.  Will continue      patient's home dose and aspirin 325 mg p.o. daily.  The fact that the      patient's pain was relieved by nitroglycerin could indicate cardiac      etiology but also could indicate esophageal spasm.  For this reason      will also place patient on home does of Prilosec and home blood      pressure medications including Metoprolol.  Will likely recommend      cardiac stress test as an outpatient.  2. Type 2 diabetes.  Will continue patient's home dose of Metformin and      also place patient on sliding scale insulin regimen sensitive.  Will      check q. a.c. ABG's and will check a hemoglobin A1C.  3. Hypertension.  Will continue patient's home dose of Metoprolol and      Norvasc.  4. Hyperlipidemia.  Will continue patient's simvastatin.  5. Gastroesophageal reflux disease.  Will continue patient's home dose of      Prilosec 40 mg daily.  6. Hematology.  The patient has mild anemia on CBC with a known history of      anemia.  Will defer further work-up as an inpatient to the team in the      morning.  7. Pulmonary.  The patient does have intermittent asthma.  Will continue      patient's home Albuterol and Flovent.  Chest x-ray was negative for      acute cardiopulmonary processes.  The patient currently has no oxygen      requirement.  8. FEN.  Will KVO patient's IV fluids and place patient on carbohydrate      modified diet.  9. Prophylaxis.  Will place patient on Prilosec and encourage early      ambulation after patient is ruled out on  telemetry.   DISPOSITION:  Anticipate discharge after patient is ruled out with cardiac  enzymes x3 and EKG in the morning.           ______________________________  Drue Dun, M.D.     EE/MEDQ  D:  08/18/2006  T:  08/20/2006  Job:  [id number]

## 2011-03-31 NOTE — Discharge Summary (Signed)
NAMENEVAE, PINNIX NO.:  [id number]   MEDICAL RECORD NO.:  [id number]          PATIENT TYPE:  INP   LOCATION:  3706                         FACILITY:  [facility]   PHYSICIAN:  Drue Dun, M.D.       DATE OF BIRTH:  September 14, 1924   DATE OF ADMISSION:  08/18/2006  DATE OF DISCHARGE:  08/21/2006                                 DISCHARGE SUMMARY   DATE OF ADMISSION:  August 18, 2006   DATE OF DISCHARGE:  August 21, 2006   ADMISSION DIAGNOSES:  1. Chest pain, rule out myocardial infarction.  2. Type 2 diabetes.  3. Hypertension.  4. Hyperlipidemia.  5. Gastroesophageal reflux disease.  6. Asthma.  7. Gout.  8. Arthritis.  9. Anemia.   DISCHARGE DIAGNOSES:  1. Chest pain, noncardiac in origin, likely of gastrointestinal etiology.  2. Type 2 diabetes.  3. Hypertension.  4. Hyperlipidemia.  5. Gastroesophageal reflux disease.  6. Asthma.  7. Gout.  8. Arthritis.  9. Anemia.   BRIEF HOSPITAL COURSE:  This is an 77 year old African-American female with  history of diabetes, hypertension, and hyperlipidemia, who was admitted for  sudden onset substernal chest pain for rule out of MI or acute coronary  syndrome.  The patient was admitted initially for 23-hour observation and  ruled out with cardiac enzymes x3, which were all negative and EKG x2, which  both showed no evidence of acute ischemia.  Given the patient's risk factors  and age, [facility] was consulted, Dr. Amil Amen, for risk  stratification with a Cardiolite scan.  The patient initially went for  Cardiolite scan on August 20, 2006, but was unable to complete the test due  to loss of IV access.  The patient had midline PICC placed on the afternoon  of August 20, 2006, and successfully underwent adenosine Cardiolite scan on  August 21, 2006, which proved to be negative.  Given the patient's  admission for rule out MI, a fasting lipid panel was also checked, which  revealed an LDL of 120.  Given  the fact that this patient has diabetes, her  goal LDL is less than 100.  Thus, her dose of Lipitor was increased at  discharge from 40 mg daily to 80 mg daily.  The patient's TSH was also  checked and was found to be normal at 2.542.  Her hemoglobin A1c was 7.5%.  Given anticipation of possible Cardiolite scan, the patient's metformin was  held during admission and her sugars were mildly elevated, but overall  fairly well controlled.  Please see dictated admission history and physical  for full detail of initial presentation and workup.   PERTINENT LABS AND DISCHARGE:  Basic metabolic panel on the day of discharge  revealed a sodium of 135, potassium of 4.0, chloride of 102, bicarbonate of  23, BUN of 15, creatinine of 0.8, glucose of 141, and calcium of 10.0.  Fasting lipid panel revealed a total cholesterol of 170, triglycerides 108,  HDL of 28, and LDL of 120.  The patient's AST and ALT on admission were  normal at 18  and 10 respectively.   DISCHARGE MEDICATIONS:  1. Aspirin 325 mg p.o. daily.  2. Glucophage 1000 mg b.i.d.  3. Lipitor 80 mg p.o. nightly.  4. Norvasc 10 mg p.o. daily.  5. Metoprolol 50 mg b.i.d.  6. Albuterol MDI 90 mcg per spray, 2 puffs inhaled every 4 hours as      needed.  7. Flovent 220 mcg, 2 puffs inhaled b.i.d.  8. Prilosec 20 mg p.o. daily.  9. Mobic 7.5 mg p.o. daily as needed for pain.  10.Lasix 20 mg p.o. as needed for swelling.  11.Reglan 10 mg p.o. every 8 hours as needed for nausea.   PENDING RESULTS AND ISSUES TO BE FOLLOWED AT DISCHARGE:  The patient's  Lipitor was increased to 80 mg p.o. daily at the time of discharge.  Thus,  would recommend followup liver function tests in 6-12 weeks.   PROCEDURES:  1. Chest x-ray on August 18, 2006, showed no acute cardiopulmonary      processes.  2. EKG, both August 18, 2006, and August 19, 2006, showed nonspecific T      wave inversion, but no evidence of acute ischemia.  3. Adenosine Cardiolite  scan on August 21, 2006, was negative for      ischemia.   CONSULTS:  Dr. Amil Amen with [facility] was consulted as to further  risk stratification for the patient and we appreciate his recommendations.   DISCHARGE INSTRUCTIONS:  The patient is to follow a carbohydrate-modified  diet.  She has no activity restrictions.   FOLLOWUP APPOINTMENTS:  The patient has followup appointment with her  primary care doctor, Dr. Lynnea Ferrier, at [facility] on  September 03, 2006, at 1:30 p.m.   The patient was discharged to home in stable condition.           ______________________________  Drue Dun, M.D.     EE/MEDQ  D:  08/21/2006  T:  08/22/2006  Job:  [id number]   cc:   Broadus John T. Pamalee Leyden, MD  Francisca December, M.D.

## 2011-03-31 NOTE — Consult Note (Signed)
[place]  Patient:    [name], [name]                        MRN: [id number] Adm. Date:  [id number] Attending:  Twana First Dictator:   Jacobo Forest, M.D. CC:         Elana Alm. Thurston Hole, M.D.   Consultation Report  REASON FOR CONSULTATION:  Recurrent fevers, status post right total knee replacement.  HISTORY OF PRESENT ILLNESS:  [name] is a 77 year old black female with type 2 diabetes, hypertension, and osteoarthritis admitted on June 18, 2000, for a right TKR secondary to severe osteoarthritis. The patient recovering well from surgery but having fevers for the past three days. The patient reports urinary frequency and an episode of urinary incontinence approximately one to two days after Foley removal. No dysuria. She is also complaining of productive cough of clear sputum. The patient given Ancef on June 21, 2000, and started on Tequin this morning. The patient reports overall that she is "fine and not ill."  REVIEW OF SYSTEMS:  Feels fine. Complains of right knee soreness. No chest pain, shortness of breath, or skin ulcers. Otherwise see HPI.  PAST MEDICAL HISTORY:  Diabetes mellitus type 2, anemia, depressive disorder, cataracts, hypertension, asthma, gastroesophageal reflux, hiatal hernia, diverticulosis, osteoarthritis.  CURRENT MEDICATIONS: 1. Aspirin one q.d. 2. Diabeta 5 mg q.d. 3. Flovent 220 mcg 2 puffs q.d. 4. Glucophage 500 mg b.i.d. 5. Hyzaar 100/25 q.d. 6. Metoprolol 50 mg 1/2 a tab b.i.d. 7. Norvasc 10 mg p.o. q.d. 8. Pepcid 20 mg b.i.d. 9. Pravachol 20 mg q.h.s.  ALLERGIES:  No known drug allergies.  SOCIAL HISTORY:  [name] lives in [place]. Has 14 children including 8 sons and 6 daughters, I believe. Great family support. No smoking or alcohol use. However, she does dip snuff. She is retired.  FAMILY HISTORY:  Positive for sickle cell.  PHYSICAL EXAMINATION:  VITAL SIGNS:  Tmax 103.1,  temperature current 102.6, pulse 121, respirations 20, blood pressure 147/67, O2 saturation 92% on room air.  GENERAL:  Very pleasant black female in no acute distress accompanied by multiple family members. She is alert and oriented.  HEENT:  Pupils are equal, round, and reactive to light. Mouth:  Poor dentition. Snuff in the mouth.  NECK:  Supple. No lymphadenopathy. No thyromegaly.  RESPIRATORY:  Clear to auscultation bilaterally. No respiratory distress.  CARDIOVASCULAR:  Regular rate. No murmurs.  MUSCULOSKELETAL:  Right knee with dressing clean, dry, and intact.  ABDOMEN:  Soft, nontender, nondistended.  SKIN:  Grossly intact. No obvious ulcers or decubiti.  LABORATORY DATA:  UA from June 22, 2000 shows small leukocyte esterase, glucose greater than 1000, WBCs 11 to 20. Blood culture negative x 2. CBG is 157 to 258. Hemoglobin 8.2 to 12 after 2 units packed red blood cell transfusion. Basic metabolic panel within normal limits, except glucose 212.  Chest x-ray from June 21, 2000, shows bibasilar atelectasis. Was discussed with the radiologist.  ASSESSMENT AND PLAN:  [name] is a 77 year old black female with diabetes mellitus type 2, hypertension, osteoarthritis, status post right total knee replacement, postop day #4 with fevers.  1. Fever. Day 3 of fevers. The patient was given dose of Ancef on June 21, 2000, and started on Tequin this morning for presumed urinary tract    infection. Etiology of the fever is likely a urinary tract infection as the    patient  is symptomatic with urinary frequency and incontinence status post    instrumentation with a Foley catheter. Respiratory infection is also    possible with increased sputum, but chest x-ray negative and no respiratory    distress. 2. Diabetes type 2. CBGs 157 and 258 on Diabeta and Glucophage. 3. Hypertension. Blood pressure 139 to 174/36 to 83 on Norvasc and Hyzaar. 4. Asthma,  stable.  RECOMMENDATIONS:  Agree with choice of Tequin for likely complicated UTI. We will also culture the urine, expect temperature defervescence within 24 to 48 hours. We will also restart/adjust home medications for diabetes and hypertension.  Greatly appreciate this consultation. We will be glad to follow along. DD:  06/22/00 TD:  06/22/00 Job: [id number]

## 2011-03-31 NOTE — H&P (Signed)
[name], [name]                 ACCOUNT NO.:  [id number]   MEDICAL RECORD NO.:  [id number]          PATIENT TYPE:  INP   LOCATION:  6733                         FACILITY:  [facility]   PHYSICIAN:  Barth Kirks, M.D.  DATE OF BIRTH:  03/28/1924   DATE OF ADMISSION:  07/11/2005  DATE OF DISCHARGE:                                HISTORY & PHYSICAL   CHIEF COMPLAINT:  Vomiting.   HISTORY OF PRESENT ILLNESS:  Patient is a 77 year old [place] American  female, patient of Broadus John T. Pamalee Leyden, M.D., of the [facility].  She came in today complaining of a one-day history of vomiting  described as spitting up like sour milk and diaphoresis and feeling hot  throughout the day.  Patient said she has had one episode of this similar  event one month ago, however, it went away.  The patient did not take any  medicines to relieve the vomiting.  The patient was able to tolerate some  p.o. liquids today.  The patient saw Dr. Tanya Nones yesterday for a six-month  follow-up and denied any problems at that time.  The patient denies  currently chest pain, shortness of breath, or pain anywhere.  The patient  received Reglan and IV fluids of a one liter bolus in the emergency room and  was feeling much better on initial examination.  The patient does have a  known history of a hiatal hernia and gastroesophageal reflux.  Patient does  take Prilosec 20 mg daily.  The patient does state that she tastes some  bitter substance that is white every time she spits up.  Her diabetes was  well controlled.  Her a.m. fasting blood sugars were in the 90s and prior to  lunch were 100s.  Patient states she has had good follow-up with her  diabetes.   REVIEW OF SYSTEMS:  CONSTITUTIONAL:  Patient denies any fever or chills.  Does have nausea and vomiting.  Conversating well.  CARDIOVASCULAR:  She  denies chest pain, shortness of breath, or history of MI.  PULMONARY:  She  denies shortness of breath or any sick  contact.  GI:  She denies any  diarrhea.  She denies any blood in her stool or vomit.  GU:  She denies  dysuria or frequency.  SKIN:  She denies any new rashes.   PROBLEM LIST:  1.  Diabetes mellitus type 2.  2.  Anemia.  3.  Depression.  4.  Cataracts.  5.  Hypertension.  6.  Asthma.  7.  Gastroesophageal reflux disease.  8.  Hiatal hernia, noncongenital.  9.  Diverticulitis of the colon.  10. Stress and urge incontinence.  11. Arthritis.  12. Paresthesias.  13. Gout.  14. Functional murmur.   PRIMARY CARE PHYSICIAN:  Other than Dr. Tanya Nones are:  1.  GI, Dr. Loreta Ave.  2.  Orthopedic, Dr. Thurston Hole.  3.  Rheumatology, Dr. Kellie Simmering.   HOME MEDICATIONS:  1.  Actos 30 mg p.o. daily.  2.  Albuterol p.r.n. inhaler.  3.  Aspirin 81 mg daily.  4.  Colchicine 0.6 b.i.d. for  gout.  5.  Diabeta 10 mg p.o. b.i.d.  6.  Diovan 80 mg p.o. daily.  7.  Ditropan 2.5 mg p.o. b.i.d.  8.  Flovent 220 mcg two puffs daily.  9.  Glucophage 1000 mg b.i.d.  10. Metoprolol 50 mg p.o. b.i.d.  11. Norvasc 10 mg p.o. daily.  12. Pravachol 40 mg nightly  13. Prednisone 5 mg daily p.r.n. gout.  14. Prilosec 20 mg daily.  15. Probenecid 500 mg p.o. daily.  16. Zyrtec 10 mg daily.   ALLERGIES:  LISINOPRIL causes a dry cough.   PHYSICAL EXAMINATION:  GENERAL APPEARANCE:  She was a no acute distress,  African American female somewhat obese.  Mental status was intact.  She was  alert and oriented x3, able to mentate and conversate, knew very well about  her situation.  VITAL SIGNS:  Temperature was 99.0, pulse 66, respirations 20, blood  pressure 129/59.  Saturations 96% on room air.  HEENT:  Her head was normocephalic and atraumatic.  Pupils are equal, round  and reactive to light bilaterally.  Ears were clear TMs bilaterally.  Mouth  and throat was moist mucous membranes. She had dentures in upper and lower.  She was status post using some chewing tobacco.  LUNGS:  Clear to auscultation bilaterally.   CARDIOVASCULAR:  Regular rate and rhythm.  No murmurs, rubs, or gallops  noted.  ABDOMEN:  Soft, nontender and nondistended with positive normal active bowel  sounds.  EXTREMITIES:  There was no lower extremity edema, +2 bilateral pulses.  Strength was 5/5 bilaterally.  GU:  Deferred.  NEUROLOGIC:  Cranial nerves II-XII  intact.  Good motor strength.  Full  range of motion, 5/5 upper and lower extremities.  Balance and gait was not  evaluated.  SKIN:  There was no noted rash.  No noted lymphadenopathy.   LABORATORIES AND TESTS ON ADMISSION:  White blood cell count was 4.9 with  49% neutrophils, 38% lymphocytes; hemoglobin 11.5, hematocrit 35.8,  platelets 202.  ISTATs were noted for sodium 140, potassium 2.9, chloride  105, bicarb 23.7, BUN 19, creatinine 1, glucose 130.  The pH was noted to be  7.45.   MEDICATIONS GIVEN IN THE EMERGENCY DEPARTMENT:  1.  Reglan 10 mg IV x1.  2.  Potassium chloride 10 mEq x1.   ASSESSMENT/PLAN:  Patient is a 77 year old African American female with new  onset of vomiting and diabetes mellitus type 2.  Patient will be admitted to  the [facility] Teaching [facility] for overnight admission.   PROBLEMS:  1.  Vomiting.  Patient was noted with hypokalemia and metabolic alkalosis on      admission.  The causes include, with her vomiting, a viral      gastroenteritis versus gastroparesis, versus gastroesophageal reflux      disease, versus hiatal hernia, versus urinary tract infections, versus      questionable myocardial infarction.  Patient denies chest pain and has      relief with Reglan so we will obtain cardiac enzymes, EKG and chest x-      ray to rule her out for myocardial infarction due to the fact that she      is a diabetic and atypical symptoms.  Patient does deny chest pain.      Patient does carry a known history of gastroesophageal reflux disease     and hiatal hernia.  Will continue her on her proton pump inhibitor.  As       for  questionable gastroenteritis and some dehydration, we will get her      one liter of IV fluid bolus of normal saline with one round of potassium      chloride which was given in the ED.  We will begin IV fluids of normal      saline with 20 mEq of potassium chloride at 150 mL an hour.  The patient      was tolerating p.o. and we will advance her diet in the a.m. as      tolerated.  We will check a fasting lipid profile in the a.m. to also      help stratify her.  We are going to recheck a basic metabolic panel now      and in the a.m. for electrolyte changes.  Her diabetes is well      controlled.  There was no history of gastroparesis.  Patient denies any      dysuria or urinary tract infection symptoms.  However, we will be      checking a urinalysis for specific gravity.  2.  Diabetes mellitus type 2.  Patient is well controlled per patient and is      a known patient of Dr. Tanya Nones.  We will continue her Actos and her      DiaBeta.  We will hold her Glucophage.  We will check CBGs q.a.c. .  We      will not give the patient a sliding scale insulin at this time due to      the fact that she is not using one at home.  Her baseline creatinine on      admission was 1.0.  3.  Anemia.  Hemoglobin was stable at 11.5.  There was no complaint or      history of gastrointestinal bleed.  4.  Gastroesophageal reflux disease.  Patient will be placed on Protonix 40      mg p.o. daily.  5.  Gout. We will hold her colchicine and will continue her Probenecid.      Patient denies any current symptoms at this time.  6.  Fluids, electrolytes, and nutrition.  The patient with noted hypokalemic      metabolic alkalosis.  This is consistent with vomiting.  We will place      the patient on 150 mL an hour of normal saline with potassium chloride      20 mEq supplementation.  We will recheck the BMET in the a.m.  The      potassium on admission was 2.9.  We will not give the patient any      medicine for  her vomiting at this time.  We will notify M.D. if she      continues to complain of vomiting.  Patient is denying nausea symptoms      at this time.  7.  Deep venous thrombosis prophylaxis.  We will place the patient on      Lovenox 40 mg subcutaneous daily.  We will consider discontinuing this      medicine in the a.m. if the patient is ambulating.  8.  Hypertension.  She is stable on her home medicines.  We will continue      her Diovan 80 mg p.o. daily and her metoprolol 50 mg p.o. b.i.d. and her      Norvasc 10 mg p.o. daily.  We will be holding her Pravachol 40 mg p.o.      nightly while she is  in the hospital.  9.  Stress incontinence/urge incontinence. We will continue her home dose of      Ditropan 2.5 mg daily.  10. Tobacco abuse.  The patient uses home medications of Flovent and      albuterol.  Cessation was encouraged. The patient was known to use      chewing tobacco. 11. Code status.  The patient is to be labeled a full code.  12. Disposition.  This is pending problem #1 of vomiting, nausea and      dehydration.      Barth Kirks, M.D.     MB/MEDQ  D:  07/11/2005  T:  07/12/2005  Job:  [id number]

## 2011-03-31 NOTE — Consult Note (Signed)
[name], [name] NO.:  [id number]   MEDICAL RECORD NO.:  [id number]            PATIENT TYPE:   LOCATION:                               FACILITY:  [facility]   PHYSICIAN:  Lyn Records, M.D.        DATE OF BIRTH:   DATE OF PROCEDURE:  12/19/2006  DATE OF DISCHARGE:                    STAT - MUST CHANGE TO CORRECT WORK TYPE   PRIMARY CARE PHYSICIAN:  Madeleine B. Erenest Rasher, M.D.   REASON FOR CONSULTATION:  New onset congestive heart failure.   CONCLUSIONS:  1. Sudden development of congestive heart failure with a previous      normal cardiac evaluation for ischemia and with a normal Cardiolite      profusion study in October of 2007.  Rule out ischemic three vessel      coronary disease.  Rule out heart failure secondary to diastolic      dysfunction due to cessation of antihypertensive therapy for one      month prior to admission.  2. Long-standing diabetes.  3. History of hypertension.  4. History of intermittent asthma.  5. Reflux esophagitis with hiatal hernia.  6. History of anemia.   RECOMMENDATIONS:  1. Agree with current management including beta blocker therapy,      diuresis as you have done, and reinstitution of cardiac therapy      including ARB therapy.  2. The patient has elevated cardiac markers either due to heart      failure or possibly due to underlying three vessel coronary      disease.  She needs to be reevaluated for ischemia and probably      needs to have coronary angiography performed to make sure that      coronary disease is not the etiology for this episode of heart      failure.  If her coronaries are clean, then perhaps this is all due      to sudden cessation of her antihypertensive therapy.  3. Specific evaluation will be performed by Dr. Logan Bores depending upon      his judgment, either cath or Cardiolite.  We will hold the patient      n.p.o. overnight.   COMMENTS:  The patient is 77 years of age and looks younger than  her  stated age.  She was admitted to the hospital on December 17, 2006 after  a several week history of increasing shortness of breath that she felt  was a cold.  There was no chest discomfort.  She was admitted to the  hospital and found to be in congestive heart failure with an elevated D-  dimer and BNP greater than 3200.  CT scan was negative for pulmonary  embolism.  Echocardiogram has demonstrated global left ventricular  hypokinesis  with EF of 25-35%.  This is in contradistinction to her  Cardiolite study performed in October of 2007 that was negative for  ischemia and the LV EF was 73%.   Prior to this admission the patient states that her Medicaid was cut off  and that she was not able to get her  high blood pressure medicines from  the beginning of January into the beginning of February.  When she  started back on the medications she had already begun feeling poorly.   Significant medical problems outlined above.   HABITS:  Does not smoke or drink.   SOCIAL HISTORY:  The patient has 13 children.  One of her daughters used  to work here at [facility] [employer].  She has been a long time [place].   MEDICATIONS ON ADMISSION:  Medications were not being taken by the  patient until the beginning of February, hence, off of it for a month:  1. Norvasc 10 mg per day.  2. Mobic 7.5 mg per day.  3. Lipitor 80 mg per day.  4. Glucophage 1000 mg b.i.d.  5. Cozaar 100 mg per day.  6. Colchicine 0.6 mg b.i.d.  7. Caltrate.  8. Aspirin 81 mg per day.   ALLERGIES:  LISINOPRIL causes cough.   PHYSICAL EXAMINATION:  GENERAL:  On exam the patient is lying flat in  bed.  She is conversing with her daughters.  She is in no acute  distress.  VITAL SIGNS:  Blood pressure 110/55, heart rate is 88, O2 saturation is  99% on room air, weight 127.  NECK:  No JVD.  No carotid bruits.  LUNGS:  Clear.  CARDIAC:  No murmur or rub.  An S4 gallop is audible.  ABDOMEN:  Soft.   EXTREMITIES:  Reveal no edema.  NEUROLOGIC:  The patient is alert and oriented x3.   LABORATORY DATA:  BUN and creatinine are 13 and 0.8, potassium 4.1.  Initial BNP was greater than 3200.  Troponin markers have been elevated  to 0.14 to 0.22 range.  CK-MBs have not been elevated.  LDL cholesterol  is 121.  D-dimer on admission was 2.57.   CT revealed borderline cardiac enlargement, mediastinal adenopathy.  Monitor since admission does not reveal any significant arrhythmias.   DISCUSSION:  The patient was admitted to the hospital with CHF and had  been developing shortness of breath that she felt was following an upper  respiratory illness for about seven days.  Of note is the fact that the  patient was not able to take any of her blood pressure medications for a  month prior to becoming ill because of insurance/Medicaid problems.   The patient's EF has significantly diminished from the October  evaluation when EF was 73%.  Now the echo demonstrates an EF that is at  best 35%.  We need to exclude three vessel duct use coronary disease in  this diabetic.  If coronaries are clean, then the decrease in LV function could be  related to pressure overload from hypertension or some viral involvement  of her myocardium.  Dr. Logan Bores will see the patient in the a.m. and make  a final decision concerning further evaluation.      Lyn Records, M.D.  Electronically Signed     HWS/MEDQ  D:  12/19/2006  T:  12/19/2006  Job:  [id number]   cc:   Tawnya Crook Erenest Rasher, M.D.  Kateri Plummer, M.D.

## 2011-03-31 NOTE — Op Note (Signed)
NAMEJASON, HAUGE                 ACCOUNT NO.:  [id number]   MEDICAL RECORD NO.:  [id number]          PATIENT TYPE:  AMB   LOCATION:  ENDO                         FACILITY:  [facility]   PHYSICIAN:  Anselmo Rod, M.D.  DATE OF BIRTH:  02-20-1924   DATE OF PROCEDURE:  01/04/2005  DATE OF DISCHARGE:                                 OPERATIVE REPORT   PROCEDURE:  Esophagogastroduodenoscopy.   ENDOSCOPIST:  Charna Elizabeth, M.D.   INSTRUMENT USED:  Olympus video panendoscope.   INDICATIONS FOR PROCEDURE:  77 year old African American female undergoing  EGD for dysphagia and abnormal barium swallow showing distal esophageal  stricture, dilatation planned if needed.   PREPROCEDURE PREPARATION:  Informed consent was obtained from the patient.  The patient was fasted for eight hours prior to the procedure.   PREPROCEDURE PHYSICAL:  Patient with stable vital signs.  Neck supple.  Chest clear to auscultation.  S1 and S2 regular.  Abdomen soft with normal  bowel sounds.   DESCRIPTION OF PROCEDURE:  The patient was placed in the left lateral  decubitus position, sedated with 50 mg of Demerol and 5 mg Versed in slow  incremental doses.  Once the patient was adequately sedated, maintained on  low flow oxygen and continuous cardiac monitoring, the Olympus video  panendoscope was advanced through the mouth piece over the tongue into the  esophagus under direct vision.  A stricture was noted in the distal  esophagus that was dilated spontaneously with the scope.  There was some  bleeding at the site of irritation.  A small hiatal hernia was seen on high  retroflexion.  The rest of the gastric mucosa appeared normal to the  proximal small bowel.  No other ulcers, erosions, masses, or polyps were  identified.  The patient tolerated the procedure well without complications.   IMPRESSION:  1.  Stricture in distal esophagus dilated spontaneously with the passage of      scope.  2.  Small hiatal hernia  seen.  3.  Normal appearing stomach and proximal small bowel.   RECOMMENDATIONS:  1.  Continue Prevacid.  2.  Carafate slurry is being prescribed 1 gram q.i.d. p.o. to be taken      inbetween meals and at bedtime.  3.  Soft diet for the next 4-5 days.  4.  Outpatient follow up in the next two weeks.      JNM/MEDQ  D:  01/04/2005  T:  01/04/2005  Job:  [id number]   cc:   Douglass Rivers, M.D.  [facility].  Family Prac. Resident  [place], [place] [address]  Fax: [phone]

## 2011-03-31 NOTE — Discharge Summary (Signed)
[place]. [facility]  Patient:    [name], [name]                        MRN: [id number] Adm. Date:  [id number] Disc. Date: [id number] Attending:  Twana First Dictator:   Kirstin Adelberger, P.A.                           Discharge Summary  ADMISSION DIAGNOSES: 1. End-stage degenerative joint disease, right knee. 2. Hypertension. 3. Diabetes mellitus.  DISCHARGE DIAGNOSES: 1. End-stage degenerative joint disease. 2. Diabetes. 3. Hypertension. 4. Reflux. 5. Urinary tract infection.  HISTORY OF PRESENT ILLNESS:  The patient is a 77 year old female who has a history of end-stage DJD of her right knee.  She has tried conservative care, including anti-inflammatories, cortisone injections, and physical therapy, all without success at this point in time.  She understands the risks, benefits, and possible complications of a right total knee replacement and is without question.  PROCEDURES:  On June 20, 2000, the patient underwent right total knee replacement.  She tolerated the procedure well with minimal blood loss and went to the PACU in stable condition.  HOSPITAL COURSE:  The patient was admitted postoperatively.  On postoperative day #1, the patient spiked a temperature of 102.5 degrees.  The surgical wound was well approximated.  She was encouraged to use incentive spirometry.  The UA on admission showed many bacteria.  On postoperative day #1, the hemoglobin was 9.2.  On postoperative day #2, the patient spiked a temperature of 103 degrees.  She was started on Tequin 400 mg IV and Diflucan 150 mg one p.o. q.d. for a yeast infection as her urine also showed yeast.  Her hemoglobin was 8.2.  The patient was transfused two units of packed red blood cells.  On postoperative day #3, the surgical wound was well approximated, she still had a decreased appetite, no urinary symptoms, temperature maximum 101.3 degrees. and the hemoglobin was 12.0  following transfusion.  On postoperative day #4, the patient was 0-50 on her CPM, hemoglobin 9.5, and temperature maximum of 100.6 degrees.  On postoperative day #5, temperature maximum of 101.5 degrees, CBGs between 85-188, and hemoglobin 9.5.  Tequin was changed to Zosyn for wider spectrum coverage as she still continues to be febrile.  She complained of some burning chest pain.  The lungs had basilar crackles.  On postoperative day #6, the temperature maximum was 103 degrees, hemoglobin 10.0, and chest pain subsiding.  Infectious disease consulted.  It was felt that Zosyn was the appropriate antibiotic and to do aggressive pulmonary toilet.  On postoperative day #6, temperature maximum 101.6 degrees.  The day before at 6 a.m., he remained afebrile for 24 hours.  The patient was progressing with physical therapy.  No longer having chest pain.  The surgical wound is well approximated and healing.  Responding well to Zosyn.  Home health was arranged.  On postoperative day #8, the patients temperature maximum was 102 degrees and the surgical wound was well approximated and healed.  DISCHARGE MEDICATIONS:  She was discharged to home on Tequin 400 mg one p.o. q.d., Percocet, Colace, and Coumadin, as well as her regular home medications.  FOLLOW-UP:  She will follow up in our office in one week for stable removal.  CONDITION ON DISCHARGE:  She was in stable condition upon discharge. DD:  08/08/00 TD:  08/08/00 Job: [id number]

## 2011-03-31 NOTE — Consult Note (Signed)
NAMECHANY, WOOLWORTH NO.:  [id number]   MEDICAL RECORD NO.:  [id number]          PATIENT TYPE:  REC   LOCATION:  RDNC                         FACILITY:  [facility]   PHYSICIAN:  Sheliah Plane, MD    DATE OF BIRTH:  Nov 06, 1924   DATE OF CONSULTATION:  DATE OF DISCHARGE:                                 CONSULTATION   REQUESTING PHYSICIAN:  Francisca December, M.D.   FOLLOW-UP CARDIOLOGIST:  Armanda Magic, M.D.   PRIMARY CARE PHYSICIAN:  Raynelle Fanning A. Mayford Knife, M.D.   REASON FOR CONSULTATION:  Coronary occlusive disease.   HISTORY OF PRESENT ILLNESS:  Patient is an 77 year old female who  underwent colon resection approximately one month ago, who had  increasing overall weakness, dizziness, poor appetite, and general  decline over the past week or so.  In addition, she noted increasing  shortness of breath and came to the emergency room.  Was admitted.  She  had evaluation for coronary artery disease more than a year ago with a  stress test.  She denies any definite chest pain or discomfort.  She  does have a history of asthma, diabetes, hypertension, rectal cancer,  hyperlipidemia, depression, GERD, anemia, gout, and diverticulosis.   Medications at home include Norvasc, Lasix, Lipitor, Lopressor, Reglan,  Flovent, albuterol, and Flonase.   FAMILY HISTORY:  Patient is currently being cared for by family members,  who are staying with her after her surgery.  She has a family history of  coronary artery disease.   REVIEW OF SYSTEMS:  Patient has positive constitutional symptoms.  She  denies fever and chills but does have increasing fatigue.  Denies  amaurosis or TIAs.  Does have shortness of breath but denies hemoptysis.  Denies abdominal pain.  Denies pedal edema.   PHYSICAL EXAMINATION:  GENERAL:  Patient is status post cath.  She is  awake and alert, neurologically intact.  Refusing bypass surgery.  VITAL SIGNS:  Blood pressure 137/74, heart rate 82, O2 sats  97%.  LUNGS:  Clear.  She does not have carotid bruits.  ABDOMEN:  A lower abdominal incision that is well-healed.  Pressure  dressing on the right groin.  She has +1 DP/PT pulses bilaterally  without edema.  She has no palpable masses in her abdomen.   IMPRESSION:  Catheter lab films are reviewed.  Patient has a diffuse  coronary disease throughout the right and circumflex system but  primarily has a fairly discrete, greater than 95% stenosis just before a  diagonal branch that takes off at a 90 degree angle, which is likely her  culprit lesion.  With the patient being 77 years old, recovering  from colon resection, and refusing surgery, it seems a perfectly  reasonable approach and may even be the preferred approach, even if she  has agreeable to surgery, to proceed with angioplasty and stenting of  the high grade left anterior descending artery lesion.  Overall, her  left ventricular function is depressed in the 20% range.      Sheliah Plane, MD  Electronically Signed     EG/MEDQ  D:  12/20/2006  T:  12/20/2006  Job:  [id number]

## 2011-03-31 NOTE — Cardiovascular Report (Signed)
[name], [name] NO.:  [id number]   MEDICAL RECORD NO.:  [id number]          PATIENT TYPE:  REC   LOCATION:  RDNC                         [facility]ACILITY:  [facility]   PHYSICIAN:  [name], M.D.  DATE OF BIRTH:  July 09, 1924   DATE OF PROCEDURE:  DATE OF DISCHARGE:                            CARDIAC CATHETERIZATION   PROCEDURE PERFORMED:  PCI/drug-eluting stent implantation, mid-LAD.   INDICATIONS:  [name] is an 77 year old woman who was admitted last  week in heart failure and with unstable angina, as well as a non-STEMI  MI.  She has been adequately treated for her heart failure and has now  been stabilized.  Dr. Carolanne Grumbling completed coronary angiography  revealing three-vessel coronary disease, but with the most severe lesion  in the LAD with an associated wall motion abnormality.  Her LVEF is in  the 30-35% range.  Cardiovascular and thoracic surgery consult was  obtained, however, they felt the patient would be best served by  coronary intervention and the anterior descending artery.  She is  brought to catheterization laboratory at this time, to provide for this  therapeutic endeavor.   PROCEDURE NOTE:  The patient is brought to cardiac catheterization  laboratory in the fasting state.  The right groin was prepped and draped  in the usual sterile fashion.  Local anesthesia was obtained with an  infiltration of 1% lidocaine.  A 6-French catheter sheath was inserted  percutaneously into the right femoral artery, utilizing an anterior  approach over guiding J-wire.  A 6-French number 3.5 CLS guiding  catheter was then advanced to the ascending aorta, where the left  coronary os was engaged.  The patient received 0.75 mg/kg bolus of  bivalirudin, followed by a 1.25 mg/kg per hour constant infusion.  The  resultant ACT was 331 seconds.  A 0.014-inch Luge intracoronary  guidewire was passed across the lesion in the mid-LAD without  difficulty.  Initial  balloon dilatation was performed with a 3.0/15-mm  Sci-Med Maverick intracoronary balloon.  This was inflated to 4  atmospheres on two occasions for not greater than 25 seconds.  This  balloon was deflated and removed, and a 3.0/18-mm Medtronic Endeavor  drug-eluting intracoronary stent was carefully positioned across the  lesion and deployed at peak pressure of 12 atmospheres for approximately  30 seconds.  The stent balloon was deflated and removed, and following  confirmation of adequate patency in orthogonal views, both with and  without the guidewire in place, the guiding catheter was removed.  A  right femoral arteriogram in the 45-degree RAO angulation documented  cannulation of the proximal superficial femoral artery, rather than the  common femoral artery.  This artery was highly diseased.  It was elected  not to proceed with percutaneous closure.  The sheath was sutured into  place, and the patient was transported to the recovery area in stable  condition.   ANGIOGRAPHY:  As mentioned, the lesion treated was in the mid-portion of  the anterior descending artery, although there was only a very trivial  first septal perforator prior to this lesion,  and the lesion extended to  it but did not involve the origin of a moderate-sized diagonal branch.  It was 90-95% stenotic, somewhat eccentric and tubular in nature.  Following balloon dilatation and stent implantation, there was no  residual stenosis.   FINAL IMPRESSION:  1. Atherosclerotic coronary vascular disease, three-vessel.  2. Successful percutaneous coronary intervention/drug-eluting stent      implantation proximal to mid-LAD.  3. Typical angina was reproduced with device insertion and balloon      inflation      [name], M.D.  Electronically Signed     JHE/MEDQ  D:  12/21/2006  T:  12/22/2006  Job:  [id number]   cc:   Raynelle Fanning A. Mayford Knife, M.D.

## 2011-03-31 NOTE — Consult Note (Signed)
[name], CRUTCHLEY NO.:  [id number]   MEDICAL RECORD NO.:  [id number]          PATIENT TYPE:  INP   LOCATION:  3706                         FACILITY:  [facility]   PHYSICIAN:  Francisca December, M.D.  DATE OF BIRTH:  August 11, 1924   DATE OF CONSULTATION:  08/19/2006  DATE OF DISCHARGE:                                   CONSULTATION   REASON FOR CONSULTATION:  Retrosternal chest pain.   IMPRESSION:  1. Retrosternal chest pain, relieved with sublingual nitroglycerin,      myocardial infarction ruled out.  2. Multiple risk factors for coronary artery disease.  3. Hypertension.  4. Diabetes mellitus.  5. Gastroesophageal reflux disease.  6. Asthma.  7. Degenerative joint disease.  8. Gout.  9. ECG shows flattened T-waves, no prior tracing for comparison.   RECOMMENDATIONS:  Will pursue an adenosine Cardiolite on August 20, 2006.   FINDINGS:  [name] is an 77 year old woman with the above problems who  was admitted yesterday with retrosternal dull aching pain that did not  radiate.  There was no associated symptoms of diaphoresis, shortness of  breath.  There was a bloated feeling in the epigastrium.  It got worse when  she got up to move around.  911 was called and 2 sublingual nitroglycerin  administered by EMS with relief of pain.   The patient has a long history of GERD but these are not similar symptoms.  She did have intestinal discomfort throughout the day yesterday and passed  extensive gas, has had several loose bowel movements.  No discomfort this  morning at the time of my examination.   PAST MEDICAL HISTORY:  As above.   CURRENT MEDICATIONS:  1. Reglan 10 mg p.o. q.8 h p.r.n.  2. Furosemide 20 mg p.o. daily p.r.n. swelling.  3. Metoprolol 50 mg p.o. b.i.d.  4. Aspirin currently 325 mg p.o. daily, dose increase in the hospital.  5. Glucophage 1000 mg p.o. daily.  6. Lipitor 40 mg p.o. daily.  7. Mobic 7.5 mg p.o. daily p.r.n.  8. Amlodipine 10  mg p.o. daily.  9. Prilosec 40 mg p.o. daily.  10.Albuterol and Flovent inhalers p.r.n.   DRUG ALLERGIES:  LISINOPRIL causes a dry cough.   FAMILY HISTORY:  Noncontributory.   SOCIAL HISTORY:  She is not married, lives with her daughter and grandson,  no ethanol or tobacco use and is retired.   REVIEW OF SYSTEMS:  Otherwise negative.   PHYSICAL EXAMINATION:  VITAL SIGNS:  Blood pressure is 174/63, heart rate is  66 and regular, respiratory rate 18, temperature 98.3.  GENERAL:  This is a pleasant 77 year old African American woman in no  distress.  HEENT:  Unremarkable.  The neck is supple without thyromegaly or masses.  The carotid upstrokes are normal.  No bruit.  CHEST:  Clear with adequate excursion.  HEART:  Regular rhythm, normal S1 and S2. I do not hear a murmur, click or  gallop.  ABDOMEN:  Soft, nontender, positive bowel sounds throughout.  EXTERNAL GENITALIA:  Not examined.  RECTAL:  Not performed.  EXTREMITIES:  Show full range of motion.  No edema.  Intact distal pulses.  NEUROLOGIC:  Cranial nerves II-XII are intact.  Motor and sensory grossly  intact.  Gait not tested.  SKIN:  Warm, dry and clear.   ACCESSORY CLINICAL DATA:  CK-MB and troponin x2 are negative yesterday and  today. Electrocardiogram shows sinus rhythm and flattened T-waves V4, V5 and  V6, essentially a nonspecific T-wave abnormality.  Chest x-ray no active  cardiopulmonary disease.   COMMENT:  It does not appear with the extensive GI symptoms that followed  that this was cardiac in nature although she certainly has multiple risk  factors for same and risk stratification with a myocardial perfusion study  is appropriate.  In the absence of any active wheezing an adenosine  Cardiolite will be undertaken.      Francisca December, M.D.  Electronically Signed     JHE/MEDQ  D:  08/19/2006  T:  08/20/2006  Job:  [id number]   cc:   Pearlean Brownie, M.D.

## 2011-03-31 NOTE — Cardiovascular Report (Signed)
NAMEERSEL, WADLEIGH NO.:  [id number]   MEDICAL RECORD NO.:  [id number]          PATIENT TYPE:  INP   LOCATION:  2925                         FACILITY:  [facility]   PHYSICIAN:  Armanda Magic, M.D.     DATE OF BIRTH:  [birth year]   DATE OF PROCEDURE:  12/20/2006  DATE OF DISCHARGE:                            CARDIAC CATHETERIZATION   REFERRING PHYSICIAN:  Raynelle Fanning A. Mayford Knife, MD   PRIMARY PHYSICIAN:  Priscille Heidelberg. Pamalee Leyden, MD   PROCEDURES:  1. Left heart catheterization.  2. Coronary angiography.  3. Left ventriculography.  4. Distal aortography.   OPERATOR:  Armanda Magic, MD   INDICATIONS:  Congestive heart failure, LV dysfunction, poorly-  controlled hypertension.   COMPLICATIONS:  None.   IV MEDICATIONS:  Versed 1 mg IV and 0.4 mg IV of Romazicon.   IV ACCESS:  Via right femoral artery 6-French sheath.   This is an 77 year old black female with history of poorly-controlled  hypertension, dyslipidemia and diabetes mellitus, who presents with  congestive heart failure and LV dysfunction and now presents for cardiac  catheterization.   The patient was brought to the cardiac catheterization laboratory in the  fasting, nonsedated state.  Informed consent was obtained.  The patient  was connected to continuous heart rate and pulse oximetry monitoring and  intermittent blood pressure monitoring.  The right groin was prepped and  draped in a sterile fashion.  Xylocaine 1% was used for local  anesthesia.  Using modified Seldinger technique, a 6-French sheath was  placed in right the femoral artery.  Under fluoroscopic guidance a 6-  [place] JL-4 catheter was placed in the left coronary artery.  Multiple  cine films were taken in the 30 degree RAO and 40 degree LAO views.  This catheter was then exchanged out over a guidewire for a 6-French JR-  4 catheter, which was placed under fluoroscopic guidance in the right  coronary artery.  Multiple cine films taken in the  30 degree RAO and 40  degree LAO views.  This catheter was then exchanged out over a guidewire  for a 6-French angled pigtail catheter, which was placed under  fluoroscopic guidance in the left ventricular cavity.  Left  ventriculography was performed in the 30 degree RAO view using a total  of 30 mL of contrast at 8 mL/sec.  The catheter was then pulled back  across the aortic valve with no significant gradient noted.  The  catheter was then pulled down to the abdominal aorta just proximal to  the takeoff of the renal arteries.  Distal aortography was performed  using a total of 25 mL of contrast at 20 mL/sec.  The catheter was then  removed over a guidewire.  At the end of the procedure all catheters and  sheaths were removed.  Manual compression was performed until adequate  hemostasis was obtained.  The patient was transferred back to her room  in stable condition.   RESULTS:  1. The left main coronary artery has a 20% distal eccentric narrowing      and then bifurcates in the  left anterior descending artery and left      circumflex artery.  2. The left anterior descending artery has a proximal 40% eccentric      narrowing and then a 95% proximal stenosis before the takeoff of a      large first diagonal branch.  The first diagonal is widely patent      and bifurcates into two daughter branches, both of which are widely      patent.  The ongoing LAD is diffusely diseased up to 50% and      traverses across the apex.  It gives rise to a second diagonal      branch, which has a 90% proximal stenosis.  3. The left circumflex has a proximal hazy lesion that appears 60-70%      stenosed and then just distal about a 50% narrowing before giving      rise to a first large obtuse marginal branch, which has an ostial      50% narrowing and then bifurcates into daughter branches, both of      which are widely patent.  The ongoing left circumflex traverses the      AV groove and is diffusely  diseased up to 40%.  4. The right coronary artery has a mid 60-70% narrowing which is hazy      and an eccentric 60% lesion right before it bifurcates into a      posterior descending artery and posterolateral artery.   Left ventriculography shows severe LV dysfunction, EF 20-25% with  anterior lateral and inferior apical akinesis and an aneurysmal inferior  apex.  Aortic pressure 129/53 mmHg, LV pressure 132/-6 6 mmHg.   Distal aortography shows an 80-90% right renal artery stenosis.   ASSESSMENT:  1. Severe three-vessel coronary disease.  2. Severe left ventricular dysfunction with anterior lateral and      inferior apical akinesis and an apical aneurysmal segment, ejection      fraction 20-25%.  3. A 90% right renal artery stenosis.  4. Congestive heart failure.  5. Diabetes mellitus.  6. Hypertension.   PLAN:  CVTS consult, and will transfer to TCU for now.  Of note, the  patient did become quite obtunded with the 1 mg of Versed, but this  significantly improved after giving her 0.4 mg of IV Romazicon.  Would  continue Cozaar for afterload reduction as well as aspirin for coronary  disease, Coreg, no Glucophage for the next 48 hours.      Armanda Magic, M.D.  Electronically Signed     TT/MEDQ  D:  12/20/2006  T:  12/21/2006  Job:  [id number]   cc:   Raynelle Fanning A. Mayford Knife, M.D.  Broadus John T. Pamalee Leyden, MD  Francisca December, M.D.

## 2011-03-31 NOTE — Procedures (Signed)
. [facility]  Patient:    [name], [name]                        MRN: [id number] Proc. Date: 01/12/00 Adm. Date:  [id number] Attending:  Charna Elizabeth CC:         Carey Bullocks, M.D.                           Procedure Report  DATE OF BIRTH:  1924-08-02  REFERRING PHYSICIAN:  Dr. Carey Bullocks, [facility].  PROCEDURE PERFORMED:  Colonoscopy.  ENDOSCOPIST:  Anselmo Rod, M.D.  INSTRUMENTS USED:  Olympus video colonoscope.  INDICATIONS:  Guaiac-positive stool in a 77 year old black female.  Rule out masses, polyps, erosions, ulcerations, etc.  PREPROCEDURE PREPARATION:  Informed consent was procured from the patient. The patient was fasted for eight hours prior to the procedure, and prepped with a bottle of magnesium citrate and a gallon of NuLytely the night prior to the procedure.  PREPROCEDURE PHYSICAL EXAMINATION:  VITAL SIGNS:  The patient had stable vital signs except for significant hypertension with blood pressure of 203/104.  NECK:  Supple.  CHEST:  Clear to auscultation, S1, S2 regular.  ABDOMEN:  Soft with normal abdominal bowel sounds.  DESCRIPTION OF PROCEDURE:  The patient was placed in the left lateral decubitus  position and received Demerol and Versed for the EGD.  No additional sedation was given for the colonoscopy.  Once the patient was adequately positioned, the Olympus video colonoscope was advanced from the rectum to the cecum with slight  difficulty secondary to some residual stool in the colon.  However, visualization was adequate.  A few left-sided diverticula seen and there seemed to be some inflammatory changes around one of the diverticula pockets.  No masses or polyps were seen.  No erosions, ulcerations, hemorrhoids, etc. were present.  The procedure was complete of the cecum.  IMPRESSION:  Essentially unrevealing colonoscopy except for a few  left-sided diverticula with evidence of inflammatory change around one of the diverticula pockets.  RECOMMENDATIONS:  The patient has been advised to follow up in the office for repeat guaiac testing.  Further recommendation will be made at that time. DD:  01/12/00 TD:  01/12/00 Job: [id number]

## 2011-03-31 NOTE — Discharge Summary (Signed)
NAMEBETTEJANE, LEAVENS NO.:  [id number]   MEDICAL RECORD NO.:  [id number]          PATIENT TYPE:  INP   LOCATION:  6733                         FACILITY:  [facility]   PHYSICIAN:  Melina Fiddler, MD DATE OF BIRTH:  10-Oct-1924   DATE OF ADMISSION:  07/11/2005  DATE OF DISCHARGE:  07/12/2005                                 DISCHARGE SUMMARY   DISCHARGE DIAGNOSES:  1.  Vomiting, now resolved.  2.  Diabetes mellitus, type 2.  3.  Gastroesophageal reflux disease.  4.  Hypokalemia, now resolved.  5.  Anemia.  6.  Gout.  7.  Hypertension.  8.  Urge and stress incontinence.  9.  Tobacco abuse.   DISCHARGE MEDICATIONS:  1.  Actos 30 mg p.o. daily.  2.  Albuterol p.r.n.  3.  Aspirin 325 mg daily.  4.  Diabeta 10 mg p.o. b.i.d.  5.  Diovan 80 mg p.o. daily.  6.  Ditropan 2.5 mg p.o. b.i.d./t.i.d.  7.  Flovent 320 mcg, two puffs daily.  8.  Glucophage 1000 mg b.i.d.  9.  Metoprolol 50 mg p.o. b.i.d.  10. Norvasc 10 mg p.o. daily.  11. Pravachol 40 mg q.h.s.  12. Prednisone 5 mg daily p.r.n. gout.  13. Prilosec over-the-counter 20 mg p.o. daily.  14. Probenecid 500 mg p.o. daily.  15. Zyrtec 10 mg p.o. daily.  16. Hydrochlorothiazide 25 mg p.o. daily p.r.n.  (The patient has been told      to discontinue this medication; however, she still takes it as needed      for her blood pressure.).  17.When she takes the above medication, we will give her potassium  supplementation of 20 mEq p.o. with each 25 mg of hydrochlorothiazide.   CONSULTATIONS:  None.   PROCEDURE:  None.   HISTORY OF PRESENT ILLNESS:  Mr. Sprankle is a 77 year old patient, well known  to Dr. Broadus John T. Pickard II at the [facility], who  came in with the complaint of one day history of vomiting and noted  hypokalemic metabolic alkalosis.  The patient was admitted to the hospital  for IV fluids and further evaluation for a history of vomiting.   HOSPITAL COURSE:  #1 -  VOMITING:  This was most likely secondary to an  increase in her gout medication of Colchicine recently.  After holding the  Colchicine and receiving Reglan, the patient's symptoms resolved.  The  patient had a noted metabolic alkalosis on admission with hypokalemia, and  this was resolved with potassium supplementation and IV fluid hydration.  It  is questionable whether this was a viral gastroenteritis versus medication;  however, it was most likely secondary to the Colchicine added, due to the  acute onset and acute resolvement of this vomiting after holding the  medicine.  The patient tolerated a good p.o. intake, a regular diet and was  given IV fluid hydration.  The patient is now stable with her vomiting.  The  patient was ruled out for a myocardial infarction with negative troponins.  Electrocardiogram was normal.  A chest x-ray with slightly  mild  cardiomegaly; however, no acute changes.  The patient denied any symptoms of  urinary tract infection, and was noted on a urinalysis to have a normal  specific gravity.  The patient's vomiting is now stable and resolved.  We  will continue to discontinue her Colchicine and to continue her Probenecid.   #2 - HYPOKALEMIA:  This was resolved with 80 mEq of potassium chloride  supplementation.  Due due to the patient's unknown history of whether she is  taking hydrochlorothiazide on a p.r.n. basis, we will give her 20 mEq of  potassium supplementation to take when she takes her hydrochlorothiazide.  The patient has been discouraged from taking this and will be followed up  for this with Dr. Tanya Nones at her next scheduled appointment.   #3 - DIABETES MELLITUS TYPE 2:  The patient has been well-controlled on her  three oral agent regimen.  Her CBG on admission were 98 prior to breakfast.  We will continue her home medicines at discharge.   #4 - ANEMIA:  It was stable.   #5 - GASTROESOPHAGEAL REFLUX DISEASE:  The patient should continue on  her  Prevacid home dose 20 mg daily.  She has a known history of a hiatal hernia  and gastroesophageal reflux disease.   #6 - GOUT:  We will discontinue her Colchicine and continue her Probenecid.  The patient denies any current symptoms at this time.  She can follow up  with Dr. Tanya Nones regarding any further acute gout flares.  It is noted that  the hydrochlorothiazide does increase her gout flares, and the patient was  told to discontinue her hydrochlorothiazide.   #7 - __________ STATUS:  Within normal limits and stable on discharge.   #8 - HYPERTENSION:  Her hypertension was slightly elevated on admission;  however, she has been pretty well regulated, and this can be followed as an  outpatient with Dr. Tanya Nones.   #9 - HISTORY OF STRESS INCONTINENCE:  Continue home dose of Ditropan.   #10 - HISTORY OF TOBACCO ABUSE:  The patient was known to have snuff in her  mouth.  She was counseled on cessation and encouraged.  The patient continue  to want to use chewing tobacco.   #11 - CODE STATUS:  Her code status is a full code.   FOLLOWUP:  The patient will follow up with Dr. Tanya Nones, as previously  scheduled.  The patient had a fasting lipid profile performed here on  admission, which was not back on the date of discharge.  This will need to  be followed up, as well as BMP on her next scheduled appointment.      Barth Kirks, M.D.    ______________________________  Melina Fiddler, MD    MB/MEDQ  D:  07/12/2005  T:  07/12/2005  Job:  [id number]   cc:   Broadus John T. Pamalee Leyden, MD  Fax: [phone]

## 2011-03-31 NOTE — Op Note (Signed)
[facility]  Patient:    [name], [name]                        MRN: [id number] Proc. Date: 06/20/00 Adm. Date:  [id number] Attending:  Twana First                           Operative Report  PREOPERATIVE DIAGNOSIS:  Right knee degenerative joint disease.  POSTOPERATIVE DIAGNOSIS:  Right knee degenerative joint disease.  PROCEDURE:  Right total knee replacement using [employer] Scorpio total knee system with #5 cemented femoral component, #7 cemented tibial component, with 15 mm polyethylene tibial spacer and 26 mm polyethylene cemented patella.  SURGEON:  Elana Alm. Thurston Hole, M.D.  ASSISTANT:  Kirstin Adelberger, P.A.  ANESTHESIA:  General.  OPERATIVE TIME:  1 hour 45 minutes.  COMPLICATIONS:  None.  DESCRIPTION OF PROCEDURE:  [name] was brought to the operating room on June 20, 2000, placed on the operating table in the supine position.  After an adequate level of general anesthesia was obtained, her right knee was examined under anesthesia.  Range of motion from 0-125 degrees, moderate varus deformity, knee stable to ligamentous exam.  She had a Foley catheter placed under sterile conditions and received Ancef 1 g IV preoperatively for prophylaxis.  The right leg was prepped using sterile Betadine and draped using sterile technique.  The leg was exsanguinated and a thigh tourniquet elevated to 350 mm.  Initially through a 20 cm longitudinal incision based over the patella, initial exposure was made.  The underlying subcutaneous tissues were incised along the skin incision.  A median arthrotomy was performed, revealing an excessive amount of normal-appearing joint fluid.  The articular surfaces showed grade 4 changes medially and in the patellofemoral joint and grade 3 changes laterally.  The medial and lateral meniscal remnants were removed as well as osteophytes off the femoral condyles and tibial plateau.  ACL also removed.   Intramedullary drill then drilled up the femoral canal for placement of the distal femoral cutting jig, which was then placed in the appropriate amount of rotation and marked and then a distal 10 mm cut was made.  The distal femur was then sized, and a #5 was felt to be the appropriate size.  The #5 cutting jig was then placed, and these cuts were made.  The proximal tibia was exposed.  The tibial spine was removed with an oscillating saw.  The intramedullary drill drilled down the tibial canal for placement of the proximal tibial cutting jig, and this cut was made in the appropriate amount of rotation with a 6 mm recessed cut based off the medial or lower side.  After this was done, the cut surface was sized.  A #7 was found to be the appropriate size.  The Scorpio PCL femoral cutting jig was then placed back on the femur, and these cuts were made.  After this was done, the #5 femoral component was placed, a #7 tibial tray with first a 12 and then a 15 mm polyethylene spacer.  This 15 mm spacer was felt to be an excellent size.  Range of motion of 0-120 degrees and no lift up on the tray, excellent stability, and normal alignment.  The tray was then marked for rotation and the keel cut was made.  The patella was then sized.  A 26 mm was found to be the appropriate size,  and a straight 10 mm cut was made across the patella and three locking holes placed for placement of the patellar button.  After this was done, it was felt that all the trial components were of excellent size, fit, and stability.  The knee was then jet lavage-irrigated with 3 L of saline solution.  The proximal tibia was then exposed and the #7 tibial tray with cement backing was hammered into position with an excellent fit, with excess cement being removed from around the edges.  The femoral component, #5 with cement backing, was hammered into position on the distal femur, also with an excellent fit, with excess cement  being removed from around the edges.  The 15 mm polyethylene spacer was locked on the tibial base plate and the knee taken through a range of motion of 0-125 degrees, excellent stability, and no lift up on the tray.  The patellar button was locked on to its cut surface and held there with a clamp.  After the cement hardened, the patellofemoral tracking was evaluated, and this was found to be normal with no need for a lateral release.  After this was done, it was felt that all the components were of excellent size, fit, and stability.  The arthrotomy was then closed with a #1 Panacryl suture over two medium Hemovac drains.  Subcutaneous tissue was closed with 0 and 2-0 Vicryl, skin closed with skin staples, sterile dressings were applied, the Hemovac injected with 0.25% Marcaine with epinephrine and then clamped, and the tourniquet was released and a long-leg splint applied, the patient awakened and taken to the recovery room in stable condition. Needle and sponge counts were correct x 2 at the end of the case. DD:  06/20/00 TD:  06/20/00 Job: [id number]

## 2011-03-31 NOTE — Discharge Summary (Signed)
[name], [name] NO.:  [id number]   MEDICAL RECORD NO.:  [id number]          PATIENT TYPE:  REC   LOCATION:  RDNC                         FACILITY:  [facility]   PHYSICIAN:  Adrian Blackwater, MDDATE OF BIRTH:  19-Mar-1924   DATE OF ADMISSION:  12/17/2006  DATE OF DISCHARGE:  09/25/2006                               DISCHARGE SUMMARY   PRINCIPAL DIAGNOSES:  1. Congestive heart failure.  2. Severe three-vessel coronary artery disease status post stent.   SECONDARY DIAGNOSES:  1. Diabetes mellitus type 2.  2. Hypertension.  3. Gastroesophageal reflux disease.  4. Hyperlipidemia.  5. Locally advanced rectal carcinoma status post resection in January      2008.  6. Arthritis.  7. Gout.  8. Diverticulosis.  9. Anemia.   ALLERGIES:  LISINOPRIL which causes cough.   HISTORY OF PRESENT ILLNESS:  The patient is an 77 year old African-  American female with a history of asthma, hypertension, diabetes,  hyperlipidemia and locally advanced rectal carcinoma status post  resection in January 2008 who presented on February 4 with progressive  dipsia.  The patient states that she had increasing overall weakness,  dizziness, poor appetite and general decline over the past week prior to  admission.  Because of these worsening symptoms patient decided to come  to [facility] Emergency Room for medical attention.  During the past  week patient denied any definite chest pain or discomfort.  Patient  complained of cough with clear sputum over the past week and some  swelling of the feel.  Patient also had sick contacts including daughter  and son.  The patient denied fever, nausea, vomiting, diarrhea or  dysuria.  Please see H&P for full details.   HOSPITAL COURSE:  1. Dyspnea.  Chest x-ray on February 4 revealed cardiomegaly with new      diffuse interstitial pattern worrisome for edema and heart failure.      In addition, patient BNP was found to be greater than 3200.   As a      result it was felt that patient's dyspnea was due to new onset CHF.      Patient's dyspnea improved on diuresis on IV Lasix.  On February 5      chest x-ray showed interval decrease in CHF/pulmonary edema.      Patient was then switched to Lasix p.o. daily then decreased to 20      mg of Lasix p.o. daily.  Patient had 2D echo performed on February      5 which showed overall left ventricular systolic function markedly      decreased with a left ventricular ejection fraction of 25%-35%.  2D      echo also showed diffuse left ventricular hypokinesis with regional      variation but without diagnostic evidence for regional wall motion      abnormalities.  By side by side comparisons of previous study in      September 2007 left ventricular contractility and focal wall motion      are now markedly abnormal.  [facility] was then consulted who  then perform cardiac catheterization, coronary angiography,      ventriculography, and distal aortography on February 7.  These      revealed severe three-vessel coronary artery disease, severe left      ventricular dysfunction with anterior lateral and inferoapical      akinesis and apical aneurysmal segment, ejection fraction 20-25%,      and a 90% right renal artery stenosis.  Patient was deemed not to      be a suitable candidate for coronary artery bypass grafting by CVTS      consultation.  As a result on February 8 patient underwent drug-      eluting stent implantation proximal to mid-LAD without      complications.  Patient was then started on Plavix and aspirin and      showed no return of dyspnea symptoms.  2. Hypertension was well controlled during hospitalization by      Carvedilol and Losartan.  3. Diabetes.  Patient's diabetes was controlled on sliding scale      insulin during hospitalization.  Patient was discharged home on      metformin.  4. GERD.  Patient's GERD was controlled on Protonix.  5. Hyperlipidemia.   Patient was on Lipitor 80 mg p.o. daily during      hospitalization.   DISCHARGE LABS:  Hemoglobin 9.4.  Hematocrit 29.3.  White blood cell  count 5.7.  Platelet count 245.  MCV of 72.7.  Sodium 137.  Potassium  3.7.  Chloride 109.  Bicarb 21.  BUN 12.  Creatinine 0.9.  Glucose 180.  Hemoglobin A1c 7.3.  Ferritin 379, iron 41, total iron-binding capacity  278, percent saturation 13.  LDL 121, HDL 27, triglycerides 140.  T4  1.36.  TSH 2.03.   DISPOSITION:  The patient will be discharged home today in satisfactory  condition.   FOLLOWUP PLANS AND APPOINTMENTS:  Follow up appointments have been  arranged with Dr. Amil Amen with [facility] on January 03, 2007 and  with Dr. Tanya Nones with [facility] on December 27, 2006.   DISCHARGE MEDICATIONS:  1. Aspirin 325 mg p.o. daily.  2. Lipitor 80 mg p.o. daily.  3. Coreg 6.25 mg p.o. twice daily.  4. Plavix 75 mg p.o. daily.  5. Lasix 20 mg p.o. daily.  6. Losartan 100 mg p.o. daily.  7. Protonix 40 mg p.o. daily.  8. Metformin 1000 mg p.o. twice daily.  9. K-Dur 10 mEq p.o. daily.     ______________________________  Reuel Boom __________, Medical Student    ______________________________  Adrian Blackwater, MD    D_/MEDQ  D:  12/24/2006  T:  12/25/2006  Job:  [id number]

## 2011-04-05 ENCOUNTER — Emergency Department (HOSPITAL_COMMUNITY)
Admission: EM | Admit: 2011-04-05 | Discharge: 2011-04-05 | Disposition: A | Discharge status: Home / Self Care | Payer: Medicare Other | Attending: Emergency Medicine | Admitting: Emergency Medicine

## 2011-04-05 ENCOUNTER — Emergency Department (HOSPITAL_COMMUNITY): Payer: Medicare Other

## 2011-04-05 ENCOUNTER — Ambulatory Visit: Payer: Medicare Other | Admitting: Family Medicine

## 2011-04-05 DIAGNOSIS — Z9010 Acquired absence of unspecified breast and nipple: Secondary | ICD-10-CM | POA: Insufficient documentation

## 2011-04-05 DIAGNOSIS — G450 Vertebro-basilar artery syndrome: Secondary | ICD-10-CM | POA: Insufficient documentation

## 2011-04-05 DIAGNOSIS — E785 Hyperlipidemia, unspecified: Secondary | ICD-10-CM | POA: Insufficient documentation

## 2011-04-05 DIAGNOSIS — M79609 Pain in unspecified limb: Secondary | ICD-10-CM | POA: Insufficient documentation

## 2011-04-05 DIAGNOSIS — I252 Old myocardial infarction: Secondary | ICD-10-CM | POA: Insufficient documentation

## 2011-04-05 DIAGNOSIS — C50919 Malignant neoplasm of unspecified site of unspecified female breast: Secondary | ICD-10-CM | POA: Insufficient documentation

## 2011-04-05 DIAGNOSIS — E119 Type 2 diabetes mellitus without complications: Secondary | ICD-10-CM | POA: Insufficient documentation

## 2011-04-05 DIAGNOSIS — I10 Essential (primary) hypertension: Secondary | ICD-10-CM | POA: Insufficient documentation

## 2011-04-05 DIAGNOSIS — I509 Heart failure, unspecified: Secondary | ICD-10-CM | POA: Insufficient documentation

## 2011-04-05 DIAGNOSIS — M25519 Pain in unspecified shoulder: Secondary | ICD-10-CM | POA: Insufficient documentation

## 2011-04-05 DIAGNOSIS — R0602 Shortness of breath: Secondary | ICD-10-CM | POA: Insufficient documentation

## 2011-04-05 DIAGNOSIS — N390 Urinary tract infection, site not specified: Secondary | ICD-10-CM | POA: Insufficient documentation

## 2011-04-05 DIAGNOSIS — I2510 Atherosclerotic heart disease of native coronary artery without angina pectoris: Secondary | ICD-10-CM | POA: Insufficient documentation

## 2011-04-05 LAB — URINALYSIS, ROUTINE W REFLEX MICROSCOPIC
Glucose, UA: 100 mg/dL — AB
Hgb urine dipstick: NEGATIVE
Ketones, ur: NEGATIVE mg/dL
Protein, ur: NEGATIVE mg/dL
Urobilinogen, UA: 0.2 mg/dL (ref 0.0–1.0)

## 2011-04-05 LAB — GLUCOSE, CAPILLARY: Glucose-Capillary: 221 mg/dL — ABNORMAL HIGH (ref 70–99)

## 2011-04-26 ENCOUNTER — Emergency Department (HOSPITAL_COMMUNITY): Payer: Medicare Other

## 2011-04-26 ENCOUNTER — Encounter: Payer: Self-pay | Admitting: Family Medicine

## 2011-04-26 ENCOUNTER — Observation Stay (HOSPITAL_COMMUNITY)
Admission: EM | Admit: 2011-04-26 | Discharge: 2011-04-27 | Disposition: A | Discharge status: Home / Self Care | Payer: Medicare Other | Attending: Family Medicine | Admitting: Family Medicine

## 2011-04-26 ENCOUNTER — Encounter (HOSPITAL_COMMUNITY): Payer: Self-pay

## 2011-04-26 DIAGNOSIS — Z853 Personal history of malignant neoplasm of breast: Secondary | ICD-10-CM | POA: Insufficient documentation

## 2011-04-26 DIAGNOSIS — I519 Heart disease, unspecified: Secondary | ICD-10-CM | POA: Insufficient documentation

## 2011-04-26 DIAGNOSIS — R059 Cough, unspecified: Secondary | ICD-10-CM | POA: Insufficient documentation

## 2011-04-26 DIAGNOSIS — Z79899 Other long term (current) drug therapy: Secondary | ICD-10-CM | POA: Insufficient documentation

## 2011-04-26 DIAGNOSIS — Z7982 Long term (current) use of aspirin: Secondary | ICD-10-CM | POA: Insufficient documentation

## 2011-04-26 DIAGNOSIS — E860 Dehydration: Secondary | ICD-10-CM | POA: Insufficient documentation

## 2011-04-26 DIAGNOSIS — E785 Hyperlipidemia, unspecified: Secondary | ICD-10-CM | POA: Insufficient documentation

## 2011-04-26 DIAGNOSIS — R05 Cough: Secondary | ICD-10-CM | POA: Insufficient documentation

## 2011-04-26 DIAGNOSIS — Z85038 Personal history of other malignant neoplasm of large intestine: Secondary | ICD-10-CM | POA: Insufficient documentation

## 2011-04-26 DIAGNOSIS — G589 Mononeuropathy, unspecified: Secondary | ICD-10-CM | POA: Insufficient documentation

## 2011-04-26 DIAGNOSIS — R5381 Other malaise: Principal | ICD-10-CM | POA: Insufficient documentation

## 2011-04-26 DIAGNOSIS — R5383 Other fatigue: Secondary | ICD-10-CM | POA: Insufficient documentation

## 2011-04-26 DIAGNOSIS — J45909 Unspecified asthma, uncomplicated: Secondary | ICD-10-CM | POA: Insufficient documentation

## 2011-04-26 DIAGNOSIS — I2510 Atherosclerotic heart disease of native coronary artery without angina pectoris: Secondary | ICD-10-CM | POA: Insufficient documentation

## 2011-04-26 DIAGNOSIS — R42 Dizziness and giddiness: Secondary | ICD-10-CM | POA: Insufficient documentation

## 2011-04-26 DIAGNOSIS — K449 Diaphragmatic hernia without obstruction or gangrene: Secondary | ICD-10-CM | POA: Insufficient documentation

## 2011-04-26 DIAGNOSIS — E118 Type 2 diabetes mellitus with unspecified complications: Secondary | ICD-10-CM

## 2011-04-26 DIAGNOSIS — K219 Gastro-esophageal reflux disease without esophagitis: Secondary | ICD-10-CM | POA: Insufficient documentation

## 2011-04-26 DIAGNOSIS — Z7902 Long term (current) use of antithrombotics/antiplatelets: Secondary | ICD-10-CM | POA: Insufficient documentation

## 2011-04-26 DIAGNOSIS — E119 Type 2 diabetes mellitus without complications: Secondary | ICD-10-CM | POA: Insufficient documentation

## 2011-04-26 DIAGNOSIS — E876 Hypokalemia: Secondary | ICD-10-CM | POA: Insufficient documentation

## 2011-04-26 DIAGNOSIS — R112 Nausea with vomiting, unspecified: Secondary | ICD-10-CM | POA: Insufficient documentation

## 2011-04-26 DIAGNOSIS — I252 Old myocardial infarction: Secondary | ICD-10-CM | POA: Insufficient documentation

## 2011-04-26 DIAGNOSIS — M129 Arthropathy, unspecified: Secondary | ICD-10-CM | POA: Insufficient documentation

## 2011-04-26 DIAGNOSIS — I10 Essential (primary) hypertension: Secondary | ICD-10-CM | POA: Insufficient documentation

## 2011-04-26 DIAGNOSIS — R51 Headache: Secondary | ICD-10-CM | POA: Insufficient documentation

## 2011-04-26 DIAGNOSIS — I5022 Chronic systolic (congestive) heart failure: Secondary | ICD-10-CM | POA: Insufficient documentation

## 2011-04-26 DIAGNOSIS — J0110 Acute frontal sinusitis, unspecified: Secondary | ICD-10-CM

## 2011-04-26 DIAGNOSIS — I509 Heart failure, unspecified: Secondary | ICD-10-CM | POA: Insufficient documentation

## 2011-04-26 DIAGNOSIS — J329 Chronic sinusitis, unspecified: Secondary | ICD-10-CM | POA: Insufficient documentation

## 2011-04-26 LAB — CBC
Hemoglobin: 10.4 g/dL — ABNORMAL LOW (ref 12.0–15.0)
MCH: 23.1 pg — ABNORMAL LOW (ref 26.0–34.0)
MCV: 71 fL — ABNORMAL LOW (ref 78.0–100.0)
RBC: 4.51 MIL/uL (ref 3.87–5.11)

## 2011-04-26 LAB — DIFFERENTIAL
Lymphs Abs: 1.4 10*3/uL (ref 0.7–4.0)
Monocytes Absolute: 0.3 10*3/uL (ref 0.1–1.0)
Monocytes Relative: 8 % (ref 3–12)
Neutro Abs: 2.4 10*3/uL (ref 1.7–7.7)
Neutrophils Relative %: 56 % (ref 43–77)

## 2011-04-26 LAB — URINALYSIS, ROUTINE W REFLEX MICROSCOPIC
Ketones, ur: NEGATIVE mg/dL
Nitrite: NEGATIVE
Specific Gravity, Urine: 1.011 (ref 1.005–1.030)
pH: 7.5 (ref 5.0–8.0)

## 2011-04-26 LAB — GLUCOSE, CAPILLARY: Glucose-Capillary: 214 mg/dL — ABNORMAL HIGH (ref 70–99)

## 2011-04-26 LAB — URINE MICROSCOPIC-ADD ON

## 2011-04-26 LAB — BASIC METABOLIC PANEL
CO2: 27 mEq/L (ref 19–32)
Chloride: 105 mEq/L (ref 96–112)
Potassium: 3.3 mEq/L — ABNORMAL LOW (ref 3.5–5.1)
Sodium: 142 mEq/L (ref 135–145)

## 2011-04-26 LAB — TROPONIN I: Troponin I: <0.3 ng/mL (ref <0.30)

## 2011-04-26 NOTE — Progress Notes (Signed)
Family Medicine Teaching [facility] Admission History and Physical  [name] Medical record number: [id number] Date of birth: 09/19/1924 Age: 77 y.o. Gender: female  Primary Care Provider: [name], MD  Chief Complaint: head pressure, nausea History of Present Illness: [name] is a 77 y.o. year old female presenting with head pressure and nausea.  On Sunday 06/10, had to use albuterol inhaler once for wheezing. Also coughing for the past few days. Denies sick contacts, fevers/chills. Today, she woke-up and felt weak while walking. Some nausea. Vomiting yellow-green-tinged sputum. No blood. Denies diarrhea or decreased PO intake. Eating normally. Good appetite. Last bowel movement today. Normal, not loose or hard.  Also started experiencing some head pressure a few days ago. Not described as pain just pressure. Also with some postnatal drip. Also some facial tenderness. ROS: denies dypsnea, dysuria, rash  Past Medical History: T2DM HLD Fe-deficiency anemia HTN CAD s/p MI 2007 Chronic systolic HF Vertebrobasilar artery syndrome Persistent asthma Reflux esophagitis, GERD Arthritis History of recurrent UTIs about every 3 months Hiatal hernia History of colon cancer s/p colonic resection 2007 Diverticulosis History of sinus problems Cataracts History of breast cancer (invasive ductal carcinoma) s/p bilateral mastectomy; now with neuropathy across chest to arm pits Allergies History of GI bleed  Past Surgical History: Past Surgical History  Procedure Date  . Breast masectomy 10/2010    Bilateral masectomy by Dr Ezzard Standing s/p invasive ductal carcinoma.  . Cardiac  catherization 2007    Severe 3-vessel disease.  EF 20-25%.  . Esophagogastroduodenoscopy 2001    Esophageal Tear  . Total knee arthroplasty 2001   Social History: Lives with son in [place]. 2 daughters help with her care. Large extensive family in area who is very involved. Denies  smoking. Dips snuff daily for past 65+ years. Denies cocaine, marijuana, or other drugs.   Family History: Mother died of old age @ 50. Father died of old age @ 36.  Extensive history of cancer: daughters with breast cancer, sister with unknown-type of cancer, and son with lung cancer. No history of diabetes, stroke, or heart attacks.  Allergies: Allergies  Allergen Reactions  . Lisinopril   (cough)  Medications: Tylenol 650mg  po tid prn ALbuterol inhaler 2-4 puffs q4hrs prn Amlodipine 10mg  po qd ASA 81mg  po qd Coreg 12.5mg  po bid Plavix 75mg  po qd Flexeril 5mg  po bid prn muscle spasms Colace 100mg  po qd Fluticasone 2 sprays each nare qd HCTZ 25mg  po qd Metformin 500mg  po bid wc Miralax 17g po qd Zantac 150mg  po bid Rosuvastatin 20mg  po qhs Tamoxifen 20mg  po qd  Physical Exam: Pulse: 72  Blood Pressure: 174/69 RR: 14   O2: 100 Temp: RA  General: NAD HEENT: cataracts bilaterally; PERRLA; no facial TTP; TM clear, no air-fluid level, no erythema; MMM; brown-tinged sputum (patient had dip in her mouth); no post-nasal drip CV: RRR, 2/6 systolic murmur heard best @ LUSB, no gallops Chest: well-healed incisions from mastectomy bilaterally; no palpable lymph nodes in axilla Pulm: no increased WOB, CTAB, no rales/wheezing Abd: NABS, soft, NT, well healed vertical incision from colonic resection Ext: no edema, no calf TTP MSK: pain in right buttocks with internal rotation of hip Neuro: A&O, fully appropriate to questions, no focal deficits  Labs and Imaging: Lab Results  Component Value Date/Time   NA 142 04/26/2011  9:50 AM   K 3.3* 04/26/2011  9:50 AM   CL 105 04/26/2011  9:50 AM   CO2 27 04/26/2011  9:50 AM  BUN 12 04/26/2011  9:50 AM   CREATININE 0.76 04/26/2011  9:50 AM   GLUCOSE 217* 04/26/2011  9:50 AM   Lab Results  Component Value Date   WBC 4.4 04/26/2011   HGB 10.4* 04/26/2011   HCT 32.0* 04/26/2011   MCV 71.0* 04/26/2011   PLT 189 04/26/2011    Color, Urine                              YELLOW            YELLOW  Appearance                               CLOUDY     a      CLEAR  Specific Gravity                         1.011             1.005-1.030  pH                                       7.5               5.0-8.0  Urine Glucose                            100        a      NEG              mg/dL  Bilirubin                                NEGATIVE          NEG  Ketones                                  NEGATIVE          NEG              mg/dL  Blood                                    NEGATIVE          NEG  Protein                                  NEGATIVE          NEG              mg/dL  Urobilinogen                             0.2               0.0-1.0          mg/dL  Nitrite  NEGATIVE          NEG  Leukocytes                               TRACE      a      NEG   Squamous Epithelial / LPF                FEW        a      RARE  WBC / HPF                                0-2               <3               WBC/hpf  RBC / HPF                                0-2               <3               RBC/hpf  Bacteria / HPF                           MANY       a      RARE   Creatine Kinase, Total                   66                7-177            U/L  CK, MB                                   2.0               0.3-4.0          ng/mL  Relative Index                           SEE NOTE.         0.0-2.5    RELATIVE INDEX IS INVALID    WHEN CK < 100 U/L  Troponin I                               <0.30             <0.30            ng/mL  CT head without contrast.  IMPRESSION:   Atrophy and chronic ischemic changes as before.  No acute   abnormality or significant interval change.   Signs of left sinus congestion.   CXR: no acute findings  2D-ECHO (12/2009):  Study Conclusions   - Left ventricle: The cavity size was normal. Systolic function was     normal. The estimated ejection fraction was in the range of 60% to     65%. Wall  motion was normal; there were no regional wall motion     abnormalities. Doppler parameters are consistent with abnormal  left ventricular relaxation (grade 1 diastolic dysfunction).   - Ventricular septum: Thickness was mildly increased. The contour     showed systolic flattening.   - Aortic valve: Trivial regurgitation.   - Pulmonary arteries: Systolic pressure was mildly to moderately     increased. PA peak pressure: 43mm Hg (S).   Transthoracic echocardiography. M-mode, complete 2D, spectral   Doppler, and color Doppler. Height: Height: 163cm. Height: 64.2in.   Weight: Weight: 56kg. Weight: 123.2lb. Body mass index: BMI:   21.1kg/m^2. Body surface area: BSA: 1.24m^2. Blood pressure: 154/58.   Patient status: Inpatient. Location: Bedside.  Assessment and Plan: [name] is a 77 y.o. year old female presenting with sinusitis and mild dehydration. Will admit to floor bed with telemetry for observation.  1. Sinusitis. Patient received dose of azithromycin in the ED. Will change to Augmentin ER 1000mg x10days. Although difficult to distinguish between viral or bacterial etiology, will treat as bacterial in this geriatric patient with persistent sinus problems and with signs of congestion on CT. Will continue home steroid nasal spray (flonase). Will given Tylenol prn for head pressure/pain. Will repeat CXR in the AM to confirm no signs of infection.  2.   T2DM, not insulin-dependent. Will continue home metformin. CMD. 3.   HTN. Will continue home Norvasc, Coreg, and HCTZ. 4.   Hypokalemia likely 2/2 HCTZ. Will replete with KCl x2. Will give K with fluids. 5.   FEN/GI. NS c 20KCl/L @ 44mL/hr. Carbohydrate-modified diet. 6.   GERD. Protonix 40mg  po qd. 7.   HLD. Continue home Crestor.  8.   Cardiac: history of CAD, s/p MI, history of CHF. No signs of CHF exacerbation at this time. Will continue home ASA 81mg , Plavix, Coreg, and Crestor. Will hydrate gently.  9.   Neuropathic pain s/p  mastectomy. Continue home flexeril prn and tamoxifen.  10. PPx. Heparin SQ. Protonix.  11. Asthma. Continue home albuterol and fluticasone. 12. Mild dehydration from vomiting. Gentle hydration with fluids @ 89mL/hr.  13. Disposition. To home pending clinical improvement.       Etta Quill. [place]

## 2011-04-27 ENCOUNTER — Observation Stay (HOSPITAL_COMMUNITY): Payer: Medicare Other

## 2011-04-27 LAB — CBC
HCT: 28.8 % — ABNORMAL LOW (ref 36.0–46.0)
Hemoglobin: 9.4 g/dL — ABNORMAL LOW (ref 12.0–15.0)
MCH: 23 pg — ABNORMAL LOW (ref 26.0–34.0)
MCHC: 32.6 g/dL (ref 30.0–36.0)
MCV: 70.6 fL — ABNORMAL LOW (ref 78.0–100.0)
Platelets: 135 10*3/uL — ABNORMAL LOW (ref 150–400)
RBC: 4.08 MIL/uL (ref 3.87–5.11)
RDW: 15.3 % (ref 11.5–15.5)
WBC: 4.3 10*3/uL (ref 4.0–10.5)

## 2011-04-27 LAB — GLUCOSE, CAPILLARY
Glucose-Capillary: 135 mg/dL — ABNORMAL HIGH (ref 70–99)
Glucose-Capillary: 149 mg/dL — ABNORMAL HIGH (ref 70–99)

## 2011-04-27 LAB — BASIC METABOLIC PANEL WITH GFR
BUN: 13 mg/dL (ref 6–23)
CO2: 24 meq/L (ref 19–32)
Calcium: 9.1 mg/dL (ref 8.4–10.5)
Chloride: 109 meq/L (ref 96–112)
Creatinine, Ser: 0.74 mg/dL (ref 0.4–1.2)
GFR calc Af Amer: >60 mL/min
GFR calc non Af Amer: >60 mL/min
Glucose, Bld: 196 mg/dL — ABNORMAL HIGH (ref 70–99)
Potassium: 4 meq/L (ref 3.5–5.1)
Sodium: 141 meq/L (ref 135–145)

## 2011-05-01 NOTE — H&P (Signed)
NAMENALAYA, WOJDYLA NO.:  [id number]  MEDICAL RECORD NO.:  [id number]  LOCATION:  6740                         FACILITY:  [facility]  PHYSICIAN:  [name] Asser Lucena, M.D.DATE OF BIRTH:  July 14, 1924  DATE OF ADMISSION:  04/26/2011 DATE OF DISCHARGE:  04/27/2011                             HISTORY & PHYSICAL   PRIMARY CARE PROVIDER:  [name], MD at [facility].  CHIEF COMPLAINT:  Head pressure, nausea.  HISTORY OF PRESENT ILLNESS:  Ms. Volkert is an 77 year old female presenting with head pressure and nausea.  On Sunday June 10, had to use albuterol inhaler once for wheezing.  Also coughing for the past few days.  Denies sick contacts, fevers, or chills.  Today she woke up and felt weak while walking.  Some nausea.  Vomiting yellow green tinged sputum.  No blood.  Denies diarrhea, decreased p.o. intake.  Eating normally.  Good appetite.  Last bowel movement today, normal, not hard nor loose.  Also, started experiencing some head pressure a few days ago. Now it is described as pain just pressure along her temples.  Also with some postnasal nasal drip.  Some facial tenderness.  REVIEW OF SYSTEMS:  Denies dyspnea, dysuria, or rash.  PAST MEDICAL HISTORY: 1. Type 2 diabetes. 2. Hyperlipidemia. 3. Iron deficiency anemia. 4. Hypertension. 5. Coronary artery disease status post MI in 2007. 6. Chronic systolic heart failure. 7. Vertebrobasilar artery syndrome. 8. Persistent asthma. 9. Reflux esophagitis, GERD. 10.Arthritis. 11.History of recurrent UTIs about every 3 months. 12.Hiatal hernia. 13.History of colon cancer status post colon resection 2007. 14.Diverticulosis. 15.History of sinus problems. 16.Cataracts. 17.History of breast cancer, invasive ductal carcinoma status post     bilateral mastectomy, now with neuropathy across chest and armpits. 18.Allergies. 19.History of GI bleed.  PAST SURGICAL HISTORY: 1. Breast mastectomy  December 2011. 2. Cardiac catheterization 2007 showing severe 3-vessel disease with     EF 20-25%. 3. Esophagogastroduodenoscopy in 2001 diagnosing esophageal tear. 4. Total knee arthroplasty in 2001.  SOCIAL HISTORY:  Lives with son in [place].  Two daughters help with her care.  Large extensive family in the area who is very involved. Denies smoking.  Does snuff daily for the past 65+ years.  Denies cocaine, marijuana, or other drugs.  FAMILY HISTORY:  Mother died of old age at 83.  Father died of old age at 22.  Extensive history of cancer, daughter's with breast cancer, sister with unknown type of cancer, and son with lung cancer.  No history of diabetes, stroke, or heart attacks.  ALLERGIES:  LISINOPRIL causes cough.  MEDICATIONS: 1. Tylenol 650 mg p.o. t.i.d. p.r.n. 2. Albuterol inhaler 2-4 puffs q.4 h. p.r.n. 3. Norvasc 10 mg p.o. daily. 4. Aspirin 81 mg p.o. daily. 5. Coreg 12.5 mg p.o. b.i.d. 6. Plavix 75 mg p.o. daily. 7. Flexeril 5 mg p.o. b.i.d. p.r.n. muscle spasms. 8. Colace 100 mg p.o. daily. 9. Fluticasone 50 mcg 2 sprays each naris daily. 10.HCTZ 25 mg p.o. daily. 11.Metformin 500 mg p.o. b.i.d. WC. 12.MiraLax 17 grams p.o. daily. 13.Zantac 150 at the 150 mg p.o. b.i.d. 14.Rosuvastatin 20 mg p.o. at bedtime. 15.Tamoxifen 20 mg p.o. daily  PHYSICAL EXAMINATION:  VITAL SIGNS:  Pulse 72, blood pressure 174/69, respiratory rate 14, O2 sat 100% on room air, temperature afebrile. GENERAL:  Not in apparent distress. HEENT:  Cataracts bilaterally.  Pupils equal, round, reactive to light. No facial tenderness to palpation.  Tympanic membranes clear.  No air fluid level, no erythema.  Moist mucous membranes.  No sputum.  No postnasal drip. CARDIOVASCULAR:  Regular rate and rhythm, 2/6 systolic murmur heard best at left upper sternal border.  No gallops. CHEST:  Well-healed incisions from mastectomy bilaterally.  No palpable lymph nodes in axilla. PULMONARY:  No  increased work of breathing, clear to auscultation bilaterally, no rales or wheezing. ABDOMEN:  Normoactive bowel sounds, soft, nontender, well-healed vertical incision from colonic resection. EXTREMITIES:  No edema, no calf tenderness to palpation. MUSCULOSKELETAL:  Pain in right buttocks with internal rotation of hip. NEURO:  Alert and oriented, fully appropriate to questions.  No focal deficits.  LABS AND IMAGING:  Sodium 142, potassium 3.3, bicarb 27, creatinine 0.76, glucose 217.  CBC:  White blood count 4.4, hemoglobin 10.4, platelets 189,000.  Urinalysis significant for trace leukocytes and 100 glucose, many bacteria.  Cardiac enzymes negative x1.  CT of the head without contrast showed atrophy and chronic ischemic changes as before. No acute abnormality or significant interval change.  Signs of left sinus congestion.  Chest x-ray, no acute findings.  ECG, no ST or T-wave abnormalities.  A 2-D echo in February 2011, EF 60-65%, grade 1 diastolic dysfunction.  ASSESSMENT/PLAN:  This is an 77 year old female with a history of type 2 diabetes and sinus problems presenting with sinusitis and mild dehydration.  We will admit to floor bed with telemetry for observation. 1. Sinusitis.  The patient received a dose of azithromycin in the ED.     We will change to Augmentin ER 1000 mg x7 days.  Although difficult     to distinguish between viral or bacterial etiology, we will treat     as bacterial in this geriatric patient with persistent sinus     problems and with signs of congestion on CT.  We will continue home     steroid nasal spray.  We will give Tylenol p.r.n. for head     pressure/pain.  We will repeat chest x-ray in the a.m. to confirm     no signs of infection. 2. Type 2 diabetes, non-insulin dependent.  We will continue home     metformin., carb modified diet. 3. Hypertension.  We will continue home Norvasc, Coreg, and HCTZ. 4. Hypokalemia likely secondary to HCTZ.  We will  replete with 30 mEq     KCl x2.  We will give K with fluids. 5. Fluids, electrolytes, and nutrition/gastrointestinal.  Normal     saline with 20 KCl at 75 mL per hour.  Carbohydrate modified diet. 6. Gastroesophageal reflux disease.  Protonix 40 mg p.o. daily. 7. Hyperlipidemia.  Continue home Crestor. 8. Cardiac.  History of coronary artery disease, status post     myocardial infarction 2007, history of congestive heart failure.     No signs of congestive heart failure exacerbation at this time.  We     will continue home aspirin 81 mg, Plavix, Coreg, Crestor.  We will     hydrate gently. 9. Neuropathic pain status post mastectomy.  Continue home Flexeril     p.r.n. and tamoxifen. 10.Prophylaxis.  Heparin subcu.  Protonix. 11.Asthma.  Continue home albuterol and fluticasone. 12.Mild dehydration from vomiting.  Gentle hydration with  fluids at 75     mL per hour.  DISPOSITION:  To home pending clinical improvement.    ______________________________ [name], MD   ______________________________ [name] Starlina Lapre, M.D.    AO/MEDQ  D:  04/26/2011  T:  04/27/2011  Job:  [id number]  Electronically Signed by [name] MD on 04/27/2011 06:06:06 PM Electronically Signed by Acquanetta Belling M.D. on 05/01/2011 12:39:46 PM

## 2011-05-02 ENCOUNTER — Encounter: Payer: Self-pay | Admitting: Family Medicine

## 2011-05-02 ENCOUNTER — Ambulatory Visit (INDEPENDENT_AMBULATORY_CARE_PROVIDER_SITE_OTHER): Payer: Medicare Other | Admitting: Family Medicine

## 2011-05-02 VITALS — BP 142/70 | HR 74 | Temp 98.4°F | Wt 126.6 lb

## 2011-05-02 DIAGNOSIS — G8918 Other acute postprocedural pain: Secondary | ICD-10-CM

## 2011-05-02 DIAGNOSIS — M549 Dorsalgia, unspecified: Secondary | ICD-10-CM

## 2011-05-02 DIAGNOSIS — J349 Unspecified disorder of nose and nasal sinuses: Secondary | ICD-10-CM

## 2011-05-02 DIAGNOSIS — I10 Essential (primary) hypertension: Secondary | ICD-10-CM

## 2011-05-02 DIAGNOSIS — E119 Type 2 diabetes mellitus without complications: Secondary | ICD-10-CM

## 2011-05-02 DIAGNOSIS — J329 Chronic sinusitis, unspecified: Secondary | ICD-10-CM

## 2011-05-02 MED ORDER — HYDROCODONE-ACETAMINOPHEN 5-325 MG PO TABS: ORAL_TABLET | ORAL | Status: DC

## 2011-05-02 MED ORDER — RANITIDINE HCL 150 MG PO CAPS: 150.0000 mg | ORAL_CAPSULE | Freq: Two times a day (BID) | ORAL | Status: DC

## 2011-05-02 NOTE — Assessment & Plan Note (Signed)
Doing much better today.  Plan: Finish course of Augmentin. Will f/u as needed regarding this issue.

## 2011-05-02 NOTE — Assessment & Plan Note (Signed)
Doing well on home BP medications. Will bring in home BP device and will check accuracy. Will use if pt becomes light headed or thinks BP is too high or low.  Will continue current medications.

## 2011-05-02 NOTE — Progress Notes (Signed)
Here for hospital follow up.   1) Follow up sinusitis. Is taking augmentin from discharge from the hospital. Feels much better. Pain is resolved. Having some mild face pressure. No nasal discharge. Feels well. No fevers or chills and no nausea or diarrhea.   2) Diabetes: Is not taking glipizide as Dr. Janalyn Harder discontinued it recently due to reported hypoglycemia down to 46. She notes that her BP was actually 146 and that she forgot to note that 100 part of the 46. She feels well. Notes that her BS is running a bit higher recently. Is due for an HbA1c today.  Lab Results  Component Value Date   HGBA1C 7.6 05/02/2011    3) Pain in axilla: From mastectomy 6 months ago. Is taking occasional Vicodin provided by her oncologists. She takes 1-2 pills 1-2 times a week. She feels pretty well most of the time. No skin changes or redness. No significant arm swelling. Oncologists state that her PCP must now prescribe this medication. Tylenol is sometimes insufficient for the pain.   4) HTN: Has a home BP cuff that she is not using. Denies any feelings of presyncope or chest pain, palpitations, or dyspnea. Feels OK.   PMH reviewed.  ROS as above otherwise neg  Exam:  Vs noted.  Gen: Well NAD HEENT: No nasal discharge or face pain on palpation.  Lungs: CTABL Nl WOB Heart: RRR no MRG Abd: NABS, NT, ND Exts: Non edematous BL  LE Skin: Axilla. Normal appearing. No skin changes. Old scars. No erythremia.

## 2011-05-02 NOTE — Patient Instructions (Signed)
Thank you for coming in today. Bring your glucose meter and Blood pressure  back with you for the next visit in 1-2 months.  Finish your augmentin.  Try to not the TV on in your bed room at night.  Come back in 1-2 months for a meet the new doctor visit and to go over some of your chronic diseases.

## 2011-05-02 NOTE — Assessment & Plan Note (Addendum)
In axilla BL occasionally. Looks normal to my exam. Think pain is due to post surgical changes. Continues to require an occasional vicodin for control. Will be happy to continue to Rx occasional pain meds as needed. Discussed with patient and family the dangers of this medication in the elderly. They understand and will use sparingly.

## 2011-05-02 NOTE — Assessment & Plan Note (Signed)
A1c in goal <8. Will continue metformin. Ask patient to bring in glucometer to the next visit. Will review and consider restarting glipizide if needed. Goal to avoid hypoglycemic events.

## 2011-05-03 ENCOUNTER — Encounter (HOSPITAL_BASED_OUTPATIENT_CLINIC_OR_DEPARTMENT_OTHER): Payer: Medicare Other | Admitting: Hematology and Oncology

## 2011-05-03 ENCOUNTER — Other Ambulatory Visit: Payer: Self-pay | Admitting: Hematology and Oncology

## 2011-05-03 DIAGNOSIS — C20 Malignant neoplasm of rectum: Secondary | ICD-10-CM

## 2011-05-03 DIAGNOSIS — Z9049 Acquired absence of other specified parts of digestive tract: Secondary | ICD-10-CM

## 2011-05-03 DIAGNOSIS — C50919 Malignant neoplasm of unspecified site of unspecified female breast: Secondary | ICD-10-CM

## 2011-05-03 DIAGNOSIS — C19 Malignant neoplasm of rectosigmoid junction: Secondary | ICD-10-CM

## 2011-05-03 DIAGNOSIS — D509 Iron deficiency anemia, unspecified: Secondary | ICD-10-CM

## 2011-05-03 LAB — CBC WITH DIFFERENTIAL/PLATELET
Eosinophils Absolute: 0.1 10*3/uL (ref 0.0–0.5)
HCT: 31.4 % — ABNORMAL LOW (ref 34.8–46.6)
LYMPH%: 26.4 % (ref 14.0–49.7)
MCV: 74.4 fL — ABNORMAL LOW (ref 79.5–101.0)
MONO%: 8.6 % (ref 0.0–14.0)
NEUT#: 2.4 10*3/uL (ref 1.5–6.5)
NEUT%: 61.4 % (ref 38.4–76.8)
Platelets: 183 10*3/uL (ref 145–400)
RBC: 4.22 10*6/uL (ref 3.70–5.45)

## 2011-05-03 LAB — COMPREHENSIVE METABOLIC PANEL
Alkaline Phosphatase: 36 U/L — ABNORMAL LOW (ref 39–117)
BUN: 13 mg/dL (ref 6–23)
Creatinine, Ser: 0.94 mg/dL (ref 0.50–1.10)
Glucose, Bld: 192 mg/dL — ABNORMAL HIGH (ref 70–99)
Sodium: 139 mEq/L (ref 135–145)
Total Bilirubin: 0.2 mg/dL — ABNORMAL LOW (ref 0.3–1.2)
Total Protein: 6.9 g/dL (ref 6.0–8.3)

## 2011-05-04 ENCOUNTER — Encounter (INDEPENDENT_AMBULATORY_CARE_PROVIDER_SITE_OTHER): Payer: Self-pay | Admitting: Surgery

## 2011-05-04 NOTE — Discharge Summary (Signed)
[name], [name]:  [id number]  MEDICAL RECORD NO.:  [id number]  LOCATION:  6740                         FACILITY:  [facility]  PHYSICIAN:  [name] Ethylene Reznick, M.D.DATE OF BIRTH:  [birth year]  DATE OF ADMISSION:  04/26/2011 DATE OF DISCHARGE:  04/27/2011                              DISCHARGE SUMMARY   PRIMARY CARE PROVIDER:  Dr. [name], [facility]  REASONS FOR HOSPITALIZATION: 1. Weakness in this geriatric patient. 2. Sinusitis. 3. Dehydration.  DISCHARGE DIAGNOSES: 1. Sinusitis. 2. Mild dehydration. 3. Type 2 diabetes. 4. Hypertension. 5. Hypokalemia. 6. Gastroesophageal reflux disease. 7. Hyperlipidemia. 8. Coronary artery disease, status post myocardial infarction in 2007     and with grade 1 diastolic dysfunction. 9. Asthma. 10.Neuropathic pain, status post bilateral mastectomy for breast     cancer.  MEDICATIONS:  New medications: 1. Augmentin 875 mg p.o. b.i.d. x6 days. 2. Florastor 1 tablet p.o. daily while on antibiotics.   continue home medications: 1. Albuterol 1-2 puffs inhaled q.4 h. p.r.n. 2. Aspirin 81 mg p.o. daily. 3. Plavix 75 mg p.o. daily. 4. Flexeril 5 mg p.o. b.i.d. p.r.n. 5. Colace 100 mg p.o. daily. 6. Fluticasone 50 mcg nasal spray 2 sprays each naris daily. 7. Hydrochlorothiazide 25 mg p.o. daily. 8. MiraLax 17 g p.o. daily. 9. Tamoxifen 20 mg p.o. daily. 10.Zantac 150 mg p.o. b.i.d. 11.Amlodipine 10 mg p.o. daily. 12.Coreg 12.5 mg p.o. b.i.d. 13.Crestor 20 mg p.o. daily. 14.Metformin 500 mg p.o. b.i.d. 15.Tylenol 325 mg p.o. q.6 h. p.r.n. pain.  CONSULTS:  None.  PROCEDURES: 1. Chest x-ray showed hyperinflated lungs with no acute     cardiopulmonary process. 2. CT of the head without contrast showed atrophy and chronic ischemic     changes as before.  No acute abnormality or significant interval     change.  Signs of left sinus congestion.  LABORATORY DATA:  White blood count 4.4,  hemoglobin 10.4.  Cardiac enzymes negative x1.  Sodium 142, potassium 3.3, creatinine 0.76. Urinalysis with trace leukocytes, few squamous epithelial cells, and many bacteria.  BRIEF HOSPITAL COURSE:  This is an 77 year old female with a history of type 2 diabetes, hypertension, and history of sinus problems who presents with weakness, head pressure, and nausea and found to have sinusitis and mild dehydration. 1. Sinusitis, unclear whether viral or bacterial.  The patient     afebrile on admission with normal white blood count.  Since she is     a geriatric patient and was feeling unstable, the patient was     started on antibiotics.  Initially received a doses of azithromycin     in the ED, then asked to continue a 7-day course of Augmentin.     Guaifenesin given for mucus and Tylenol for pain.  The patient     feeling significantly better the next day and was no longer     complaining of lightheadedness or instability when ambulating. 2. Type 2 diabetes.  Fair control on home metformin.  CBGs in the     hospital were 130s to 210s. 3. Hypertension.  Controlled on home Norvasc, Coreg, and     hydrochlorothiazide. 4.  Hypokalemia, likely secondary to hydrochlorothiazide, resolved with     KCl 40 mEq x1. 5. GERD.  Stable on proton pump inhibitor while in the hospital. 6. Coronary artery disease, status post MI in 2007 and with grade 1     diastolic heart failure.  Stable.  The patient monitored on     telemetry overnight with no events.  Continued on home aspirin,     Plavix, Crestor, Coreg.  The patient unable to tolerate ACE     secondary to cough. 7. History of asthma.  Stable.  Did not require any albuterol during     this hospitalization.  DISPOSITION:  The patient was discharged to home in stable medical condition.  DISCHARGE INSTRUCTIONS:  The patient was asked to follow up with Dr. Janalyn Harder on May 02, 2011, at 10:15 a.m.  The patient was asked to call PCP if she had a temperature  greater than 100.4 or worsening head pressure.    ______________________________ [name]   ______________________________ [name] Duan Scharnhorst, M.D.    AO/MEDQ  D:  04/27/2011  T:  04/28/2011  Job:  [id number]  cc:   [name], [name]  Electronically Signed by [name] Mann [name] on 05/02/2011 04:26:32 PM Electronically Signed by Acquanetta Belling M.D. on 05/04/2011 03:42:14 PM

## 2011-05-22 ENCOUNTER — Telehealth: Payer: Self-pay | Admitting: Family Medicine

## 2011-05-22 NOTE — Telephone Encounter (Signed)
Will be OK until the visit on the 19th. Will follow

## 2011-05-22 NOTE — Telephone Encounter (Signed)
Asking rn to call her about her blood sugar staying high, says it keeps staying in 200s. Has appt with MD on 7.19.

## 2011-05-22 NOTE — Telephone Encounter (Signed)
Patient concerned b/c her BS got up to 298 this afternoon.  She checks then twice a day.  This is the only time it has gotten into the 200s.  She has not changed her diet although she did admit to eating ice cream yesterday afternoon.  Patient is not symptomatic.  Told her to call me back tomorrow at the end of the day with her readings.

## 2011-05-27 IMAGING — CR DG CHEST 2V
2 series · 2 of 2 positions shown · non-contrast
Comparison: CT chest 11/24/2010 and chest radiograph 10/23/2010

CLINICAL DATA: Chest wall pain.

CHEST - 2 VIEW

[w chest pa]
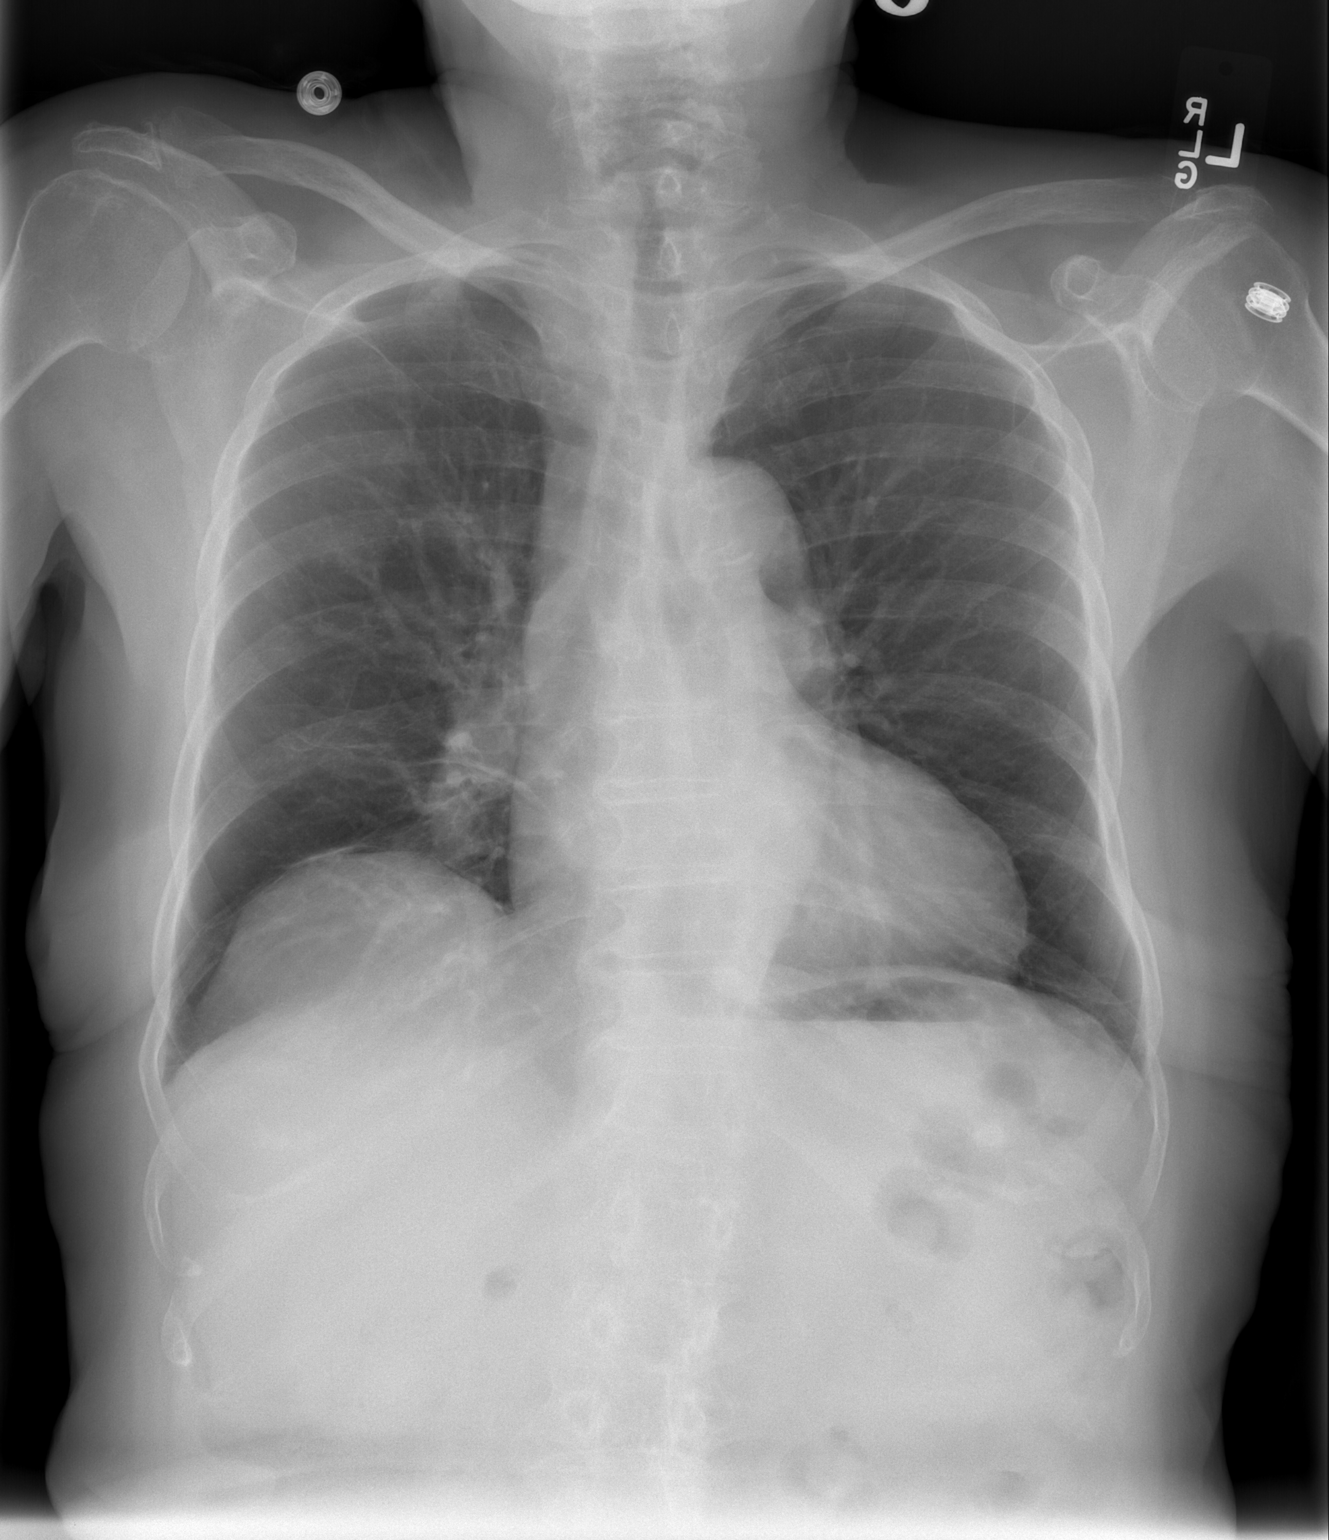

[w chest lat]
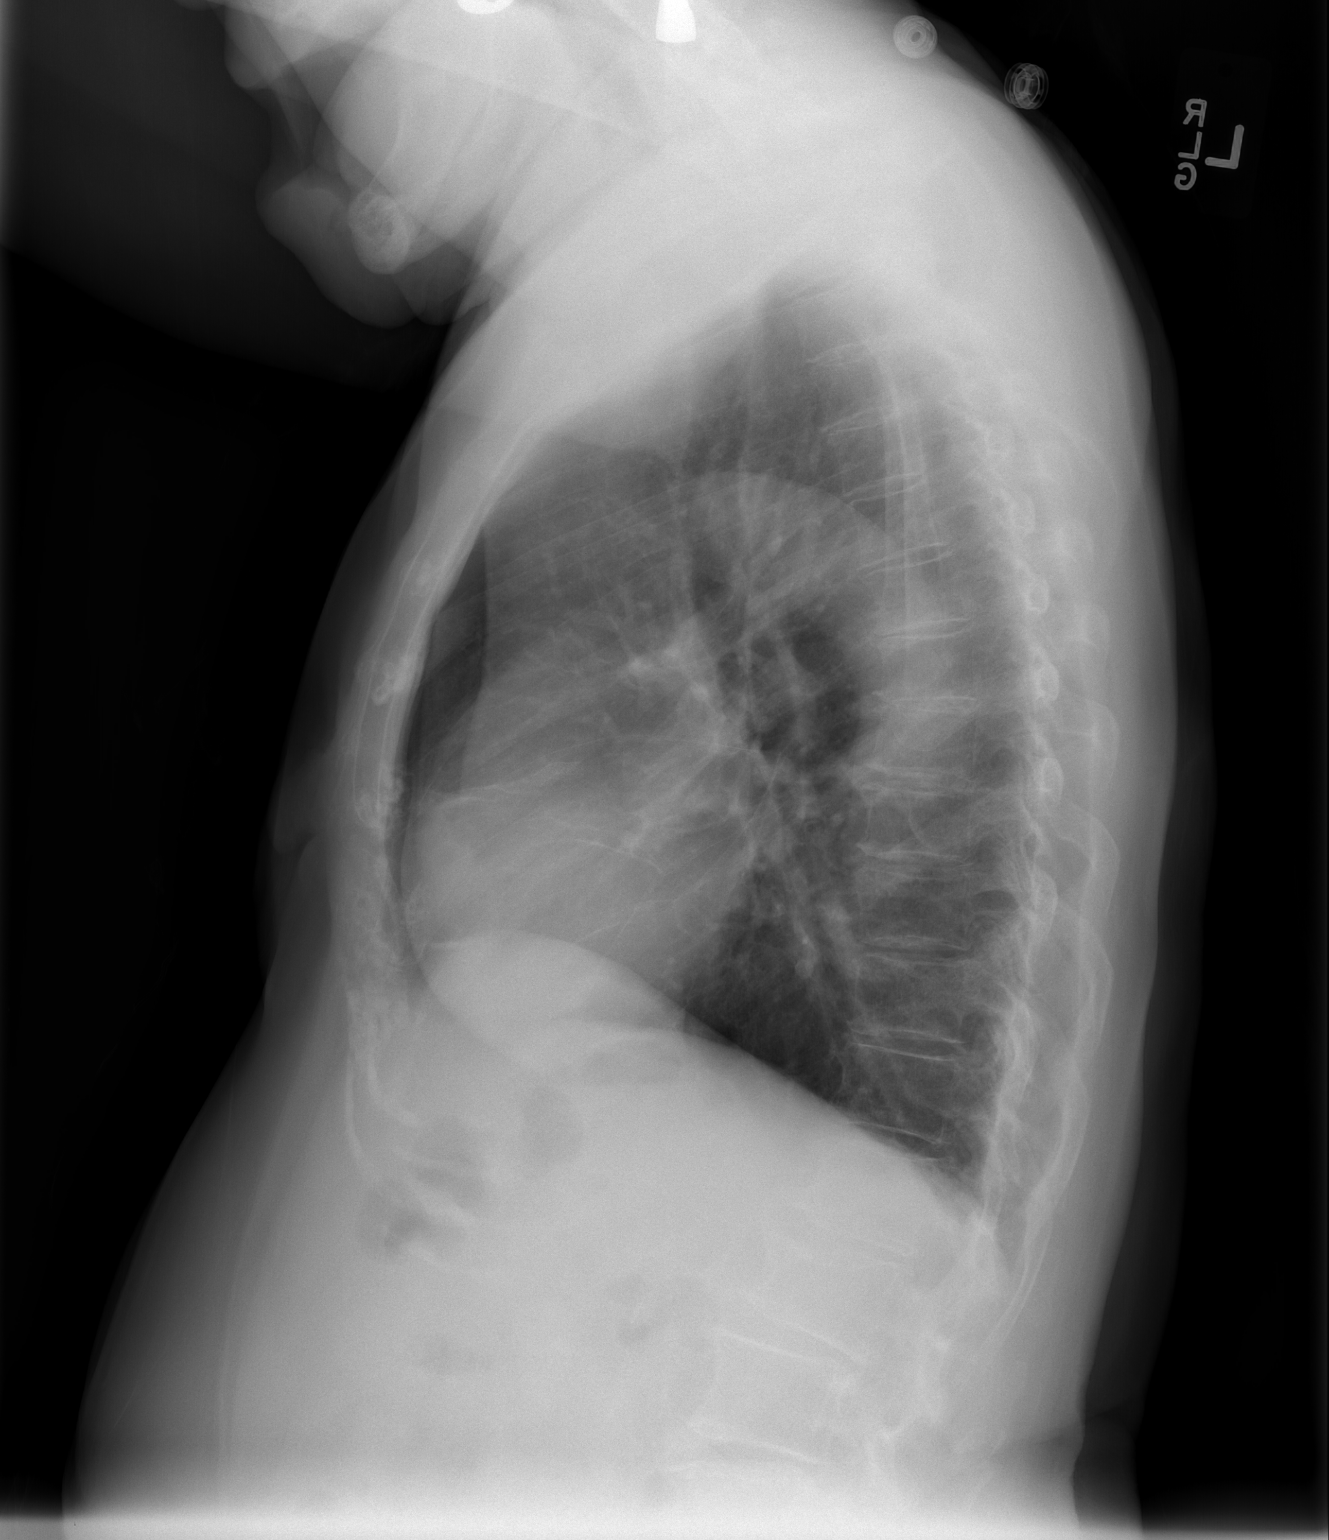

[2 of 2 positions shown; findings below may reference images not displayed]

FINDINGS: Trachea is midline.  Heart size stable.  Thoracic aorta
is calcified.  Linear densities are seen in both lung bases, likely
stable.  No pleural fluid.  Degenerative changes are seen in the
spine.  Mild superior endplate compression of a lower thoracic or
upper lumbar vertebral body appears stable.
IMPRESSION: Probable bibasilar scarring.  No acute findings.

## 2011-06-01 ENCOUNTER — Ambulatory Visit (INDEPENDENT_AMBULATORY_CARE_PROVIDER_SITE_OTHER): Payer: Medicare Other | Admitting: Family Medicine

## 2011-06-01 ENCOUNTER — Encounter: Payer: Self-pay | Admitting: Family Medicine

## 2011-06-01 VITALS — BP 155/83 | HR 83 | Temp 97.7°F | Wt 126.0 lb

## 2011-06-01 DIAGNOSIS — R5383 Other fatigue: Secondary | ICD-10-CM

## 2011-06-01 DIAGNOSIS — J349 Unspecified disorder of nose and nasal sinuses: Secondary | ICD-10-CM

## 2011-06-01 DIAGNOSIS — F17200 Nicotine dependence, unspecified, uncomplicated: Secondary | ICD-10-CM

## 2011-06-01 DIAGNOSIS — R350 Frequency of micturition: Secondary | ICD-10-CM

## 2011-06-01 DIAGNOSIS — Z720 Tobacco use: Secondary | ICD-10-CM

## 2011-06-01 DIAGNOSIS — E119 Type 2 diabetes mellitus without complications: Secondary | ICD-10-CM

## 2011-06-01 DIAGNOSIS — J329 Chronic sinusitis, unspecified: Secondary | ICD-10-CM

## 2011-06-01 MED ORDER — LORATADINE 10 MG PO TABS: 10.0000 mg | ORAL_TABLET | Freq: Every day | ORAL | Status: DC

## 2011-06-01 MED ORDER — GLIPIZIDE 5 MG PO TABS: 5.0000 mg | ORAL_TABLET | Freq: Two times a day (BID) | ORAL | Status: DC

## 2011-06-01 MED ORDER — HYDROCHLOROTHIAZIDE 25 MG PO TABS: 25.0000 mg | ORAL_TABLET | Freq: Every day | ORAL | Status: DC

## 2011-06-01 NOTE — Progress Notes (Signed)
[name] presents to clinic today to followup several problems.  Fatigue: [name] has few days that she is very tired and worn out in the mornings. However she has noted that she has started taking Benadryl as well. By later in the day she feels better. She does not take naps. She denies any fevers or chills.  Stuffy nose: [name] also notes some sinus congestion. [name] is not a new problem for her. She has been treating [name] with Benadryl.  However in the past she has done well with selective histamine blockers such as loratadine or Allegra however Medicaid stopped and for Allegra.    Urinary frequency:  [name] notes that she often has to use the restroom even after she's been recently. She additionally notes that showed incontinence issues unable to reach the bathroom in time. However [name] problem has been ongoing for a long time. She denies any pain or burning when she PTs he says [name] is not a likely urinary tract infection. When asked if she would like medication to treat what is likely overactive bladder she says that she doesn't think she would like to take any more pills. She denies any fevers or chills.  Diabetes: Currently not taking glipizide as it was discontinued by Dr. Janalyn Harder.  However there appears to be an area drink he should between the patient and the prior doctor.  [name] Tyrell Dr. Janalyn Harder that her glucose was 46 when she meant to say 146.  At that point Dr. Janalyn Harder discontinued the glipizide.  [name] notes that her blood sugar has been as high as 200 off of glipizide.  She wishes to restart [name] medication and she feels like she had good control on glipizide.  She denies any low blood sugars.    PMH reviewed.  ROS as above otherwise neg  Exam:  BP 155/83  Pulse 83  Temp(Src) 97.7 F (36.5 C) (Oral)  Wt 126 lb (57.153 kg) Gen: Well NAD HEENT: EOMI, PERRL, MMM. Normal nasal turbinates. No erythremia or discharge.  Lungs: CTABL Nl WOB Heart: RRR no MRG Abd: NABS, NT,  ND Exts: Non edematous BL  LE

## 2011-06-01 NOTE — Patient Instructions (Addendum)
Thank you for coming in today. Restart glipizide and let me know if you are having lows.  Stop taking benadryl Take claratin

## 2011-06-02 ENCOUNTER — Encounter: Payer: Self-pay | Admitting: Family Medicine

## 2011-06-02 DIAGNOSIS — F17200 Nicotine dependence, unspecified, uncomplicated: Secondary | ICD-10-CM | POA: Insufficient documentation

## 2011-06-02 DIAGNOSIS — R350 Frequency of micturition: Secondary | ICD-10-CM | POA: Insufficient documentation

## 2011-06-02 NOTE — Assessment & Plan Note (Signed)
This is a chronic issue for Ms. [name].  She is treating A. currently with fluticasone however she's also taking Benadryl.  Benadryl makes her sedated.  In the past she had good control with Allegra. However Medicaid will not pay for Allegra; but will pay for Claritin. Will use Claritin and follow this issue up at the next visit. No signs or symptoms of infection.

## 2011-06-02 NOTE — Assessment & Plan Note (Signed)
This is also a somewhat chronic issue for [name].  However I think it is exacerbated by her use of antihistamine such as Benadryl.  Will switch to selective H1 antagonist and follow.

## 2011-06-02 NOTE — Assessment & Plan Note (Signed)
Currently doing pretty well with her glucose however worse off of glipizide.  She never did have any true hypoglycemic episodes on glipizide. It appears her reports of hypoglycemia or a simple miscommunication between her and her physician.  Plan to restart glipizide at his previous dose of 5 mg twice a day and will follow glucose at the next visit.  Warned of signs or symptoms of hypoglycemia.

## 2011-06-02 NOTE — Assessment & Plan Note (Signed)
Urinary frequency with perhaps urge incontinence of urine. Over this issue isn't terribly bothersome for the patient and she did not wish to pursue any further workup or medical therapy.  I agree I feel the risks versus benefits of medications for this issue may not be worth it unless the patient feels like this is a large issue in her life.  Will follow along.

## 2011-06-02 NOTE — Assessment & Plan Note (Signed)
Ms. [name] uses snuff and has done so since she was 77-year-old.  She has no interest in quitting. Will follow

## 2011-06-06 ENCOUNTER — Ambulatory Visit: Payer: Medicare Other | Admitting: Family Medicine

## 2011-06-07 ENCOUNTER — Other Ambulatory Visit: Payer: Self-pay | Admitting: Family Medicine

## 2011-06-08 NOTE — Telephone Encounter (Signed)
Refill request

## 2011-06-09 ENCOUNTER — Other Ambulatory Visit: Payer: Self-pay | Admitting: Family Medicine

## 2011-06-09 ENCOUNTER — Telehealth: Payer: Self-pay | Admitting: Family Medicine

## 2011-06-09 NOTE — Telephone Encounter (Signed)
Please send plavix to pharmacy.

## 2011-06-09 NOTE — Telephone Encounter (Signed)
Refills were sent in but the one for Cholesterol, Plavix needs to go to [facility] on [address].

## 2011-06-11 NOTE — Telephone Encounter (Signed)
Refill request

## 2011-06-12 ENCOUNTER — Other Ambulatory Visit: Payer: Self-pay | Admitting: Family Medicine

## 2011-06-12 NOTE — Telephone Encounter (Signed)
Refill request

## 2011-06-17 IMAGING — CT CT HEAD W/O CM
1 of 2 series · 14 of 30 positions shown, 18 images · non-contrast
Comparison: 08/17/2010

CLINICAL DATA: Headache

CT HEAD WITHOUT CONTRAST
TECHNIQUE: Contiguous axial images were obtained from the base of
the skull through the vertex without contrast.

[Series 3: head routine 4.8 h60s · axial · 0.44mm/px · z∈[+1194,+1339]mm · 14 of 36 slices shown, 18 images]
[im 3/36  brain]
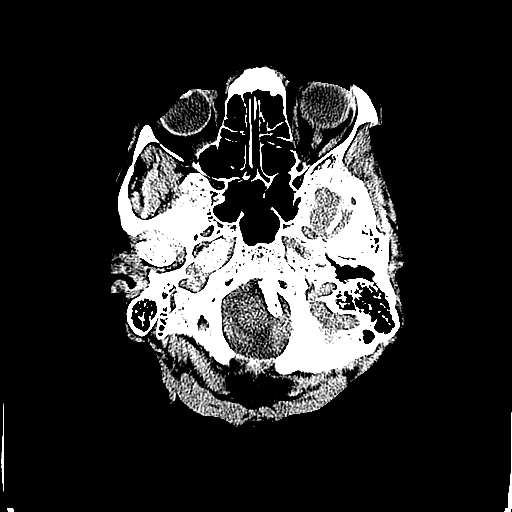
[im 3/36  bone]
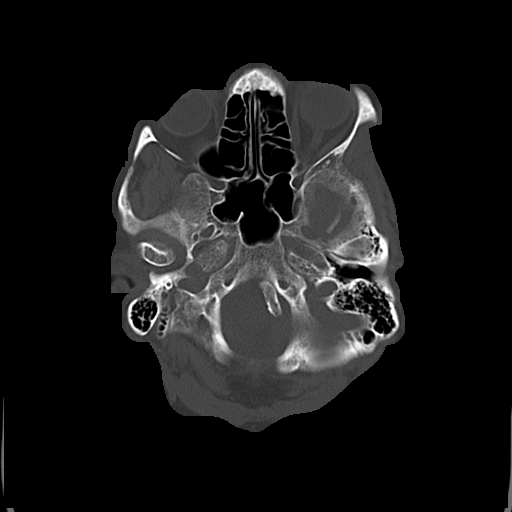
[im 5/36  brain]
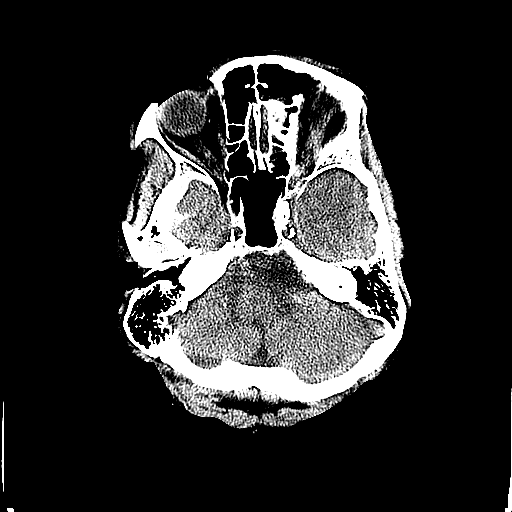
[im 8/36  brain]
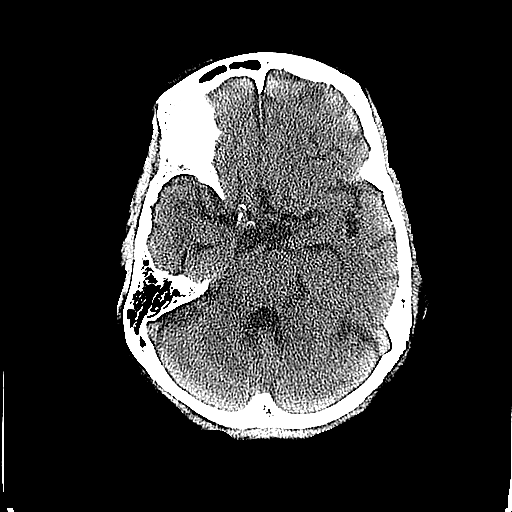
[im 10/36  brain]
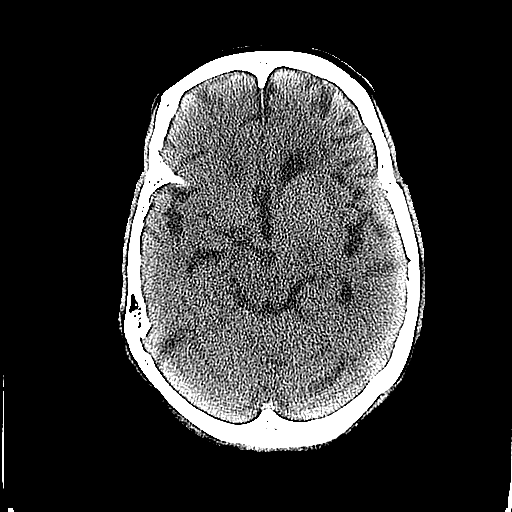
[im 12/36  brain]
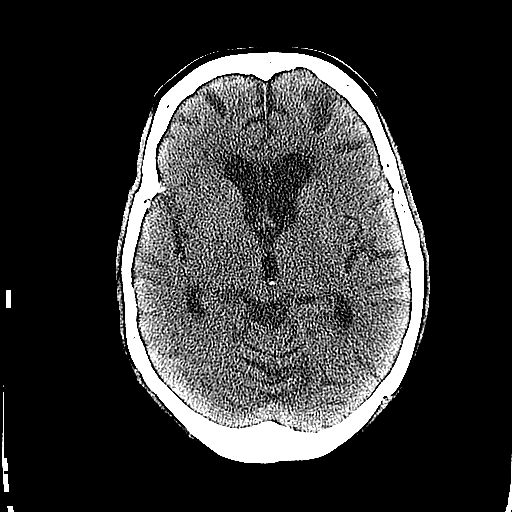
[im 12/36  bone]
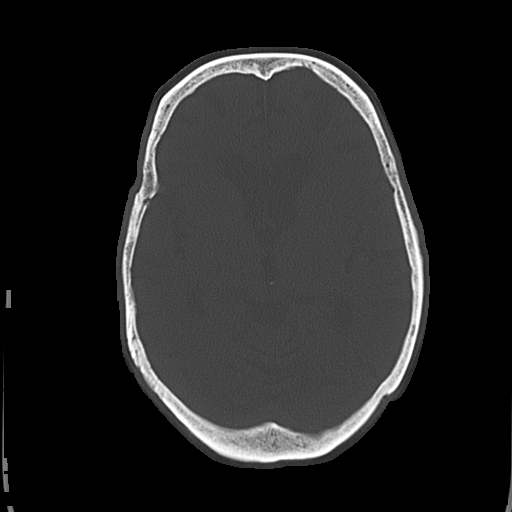
[im 15/36  brain]
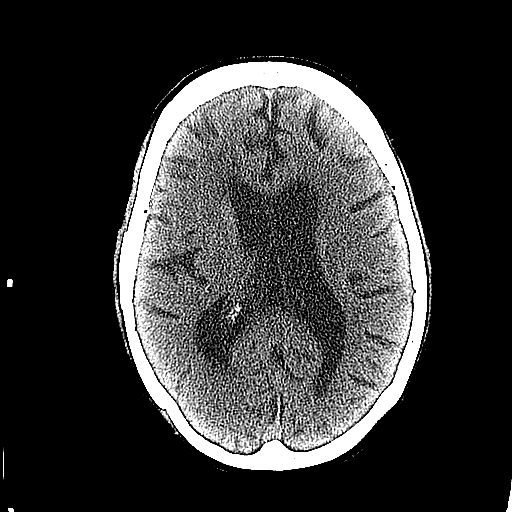
[im 17/36  brain]
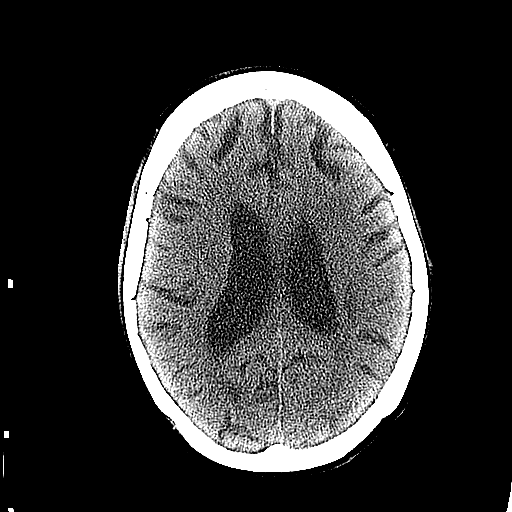
[im 19/36  brain]
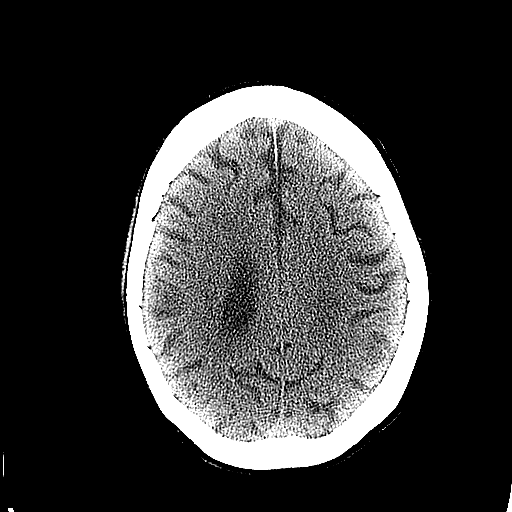
[im 22/36  brain]
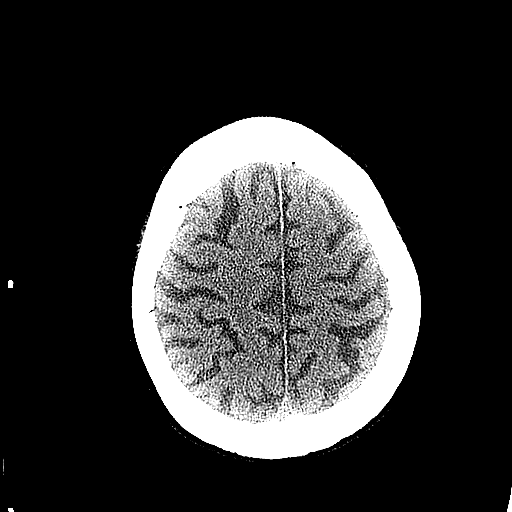
[im 22/36  bone]
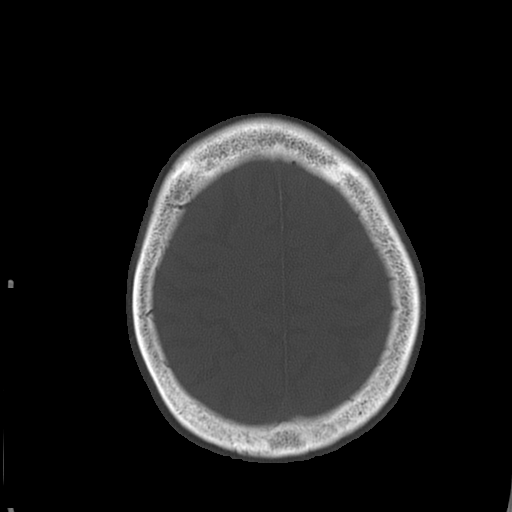
[im 24/36  brain]
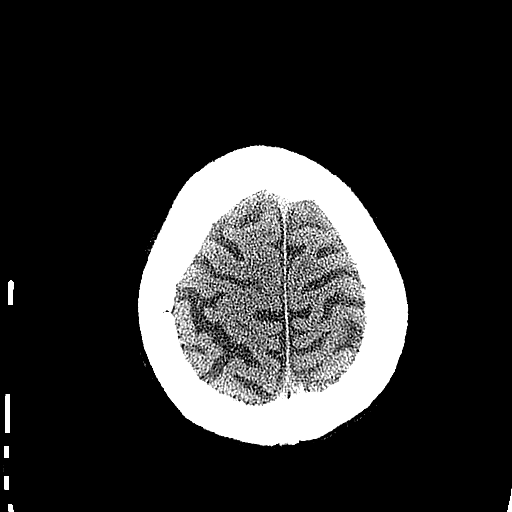
[im 26/36  brain]
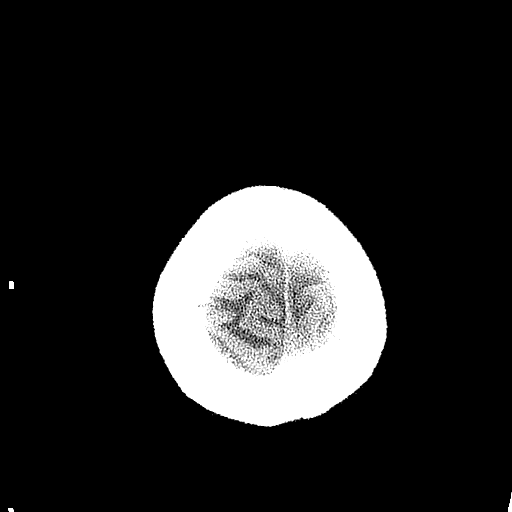
[im 29/36  brain]
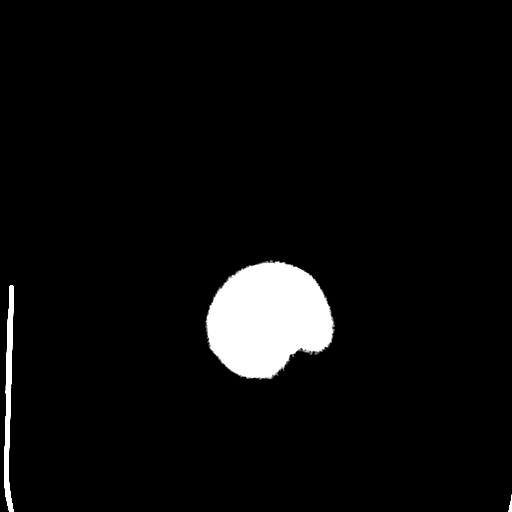
[im 31/36  brain]
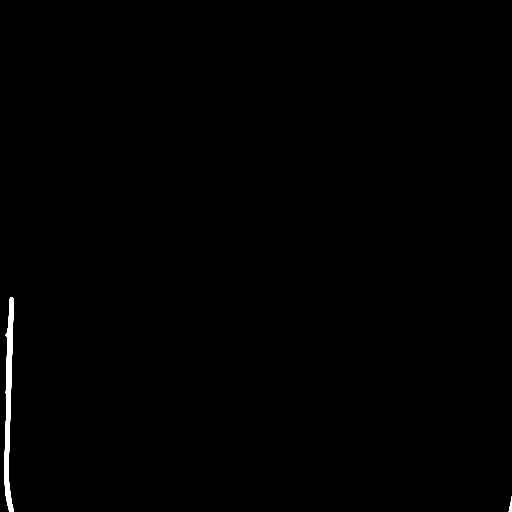
[im 31/36  bone]
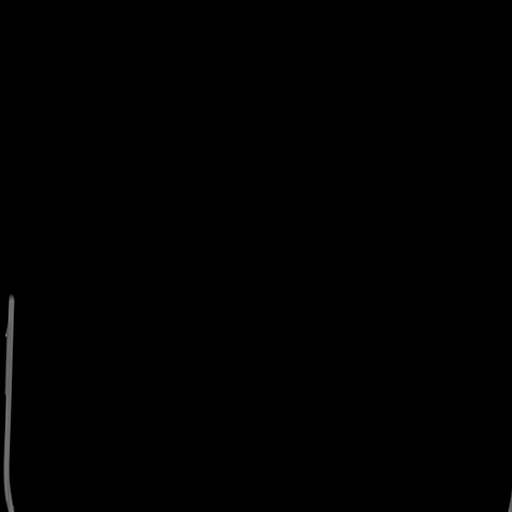
[im 33/36  brain]
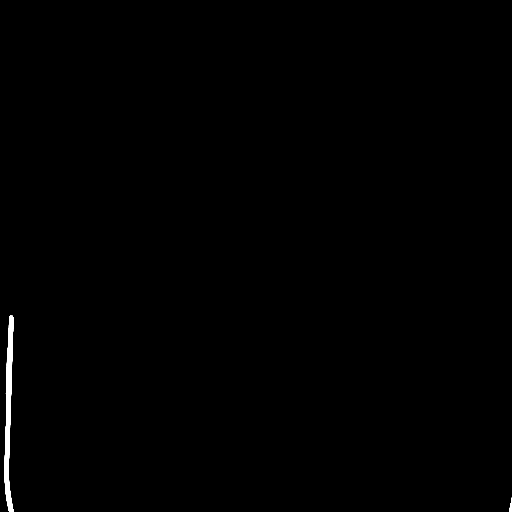

[14 of 30 positions shown; findings below may reference images not displayed]

FINDINGS: There is age related central and cortical atrophy.  There
is microvascular white matter disease as before.  There is an old
lacunar infarct in the right basal ganglia.

 No evidence for acute infarct, hemorrhage, or mass lesion. No
extra-axial fluid collections or midline shift.  Calvarium intact.
No fluid in the sinuses visualized.
IMPRESSION: Atrophy and chronic ischemic changes as before.  No acute
abnormality or significant interval change.

## 2011-06-17 IMAGING — CR DG CHEST 1V PORT
1 series · 1 of 1 positions shown · non-contrast
Comparison: 04/05/2011

CLINICAL DATA: Cough.  Dizziness and vomiting.

PORTABLE CHEST - 1 VIEW

[AP]
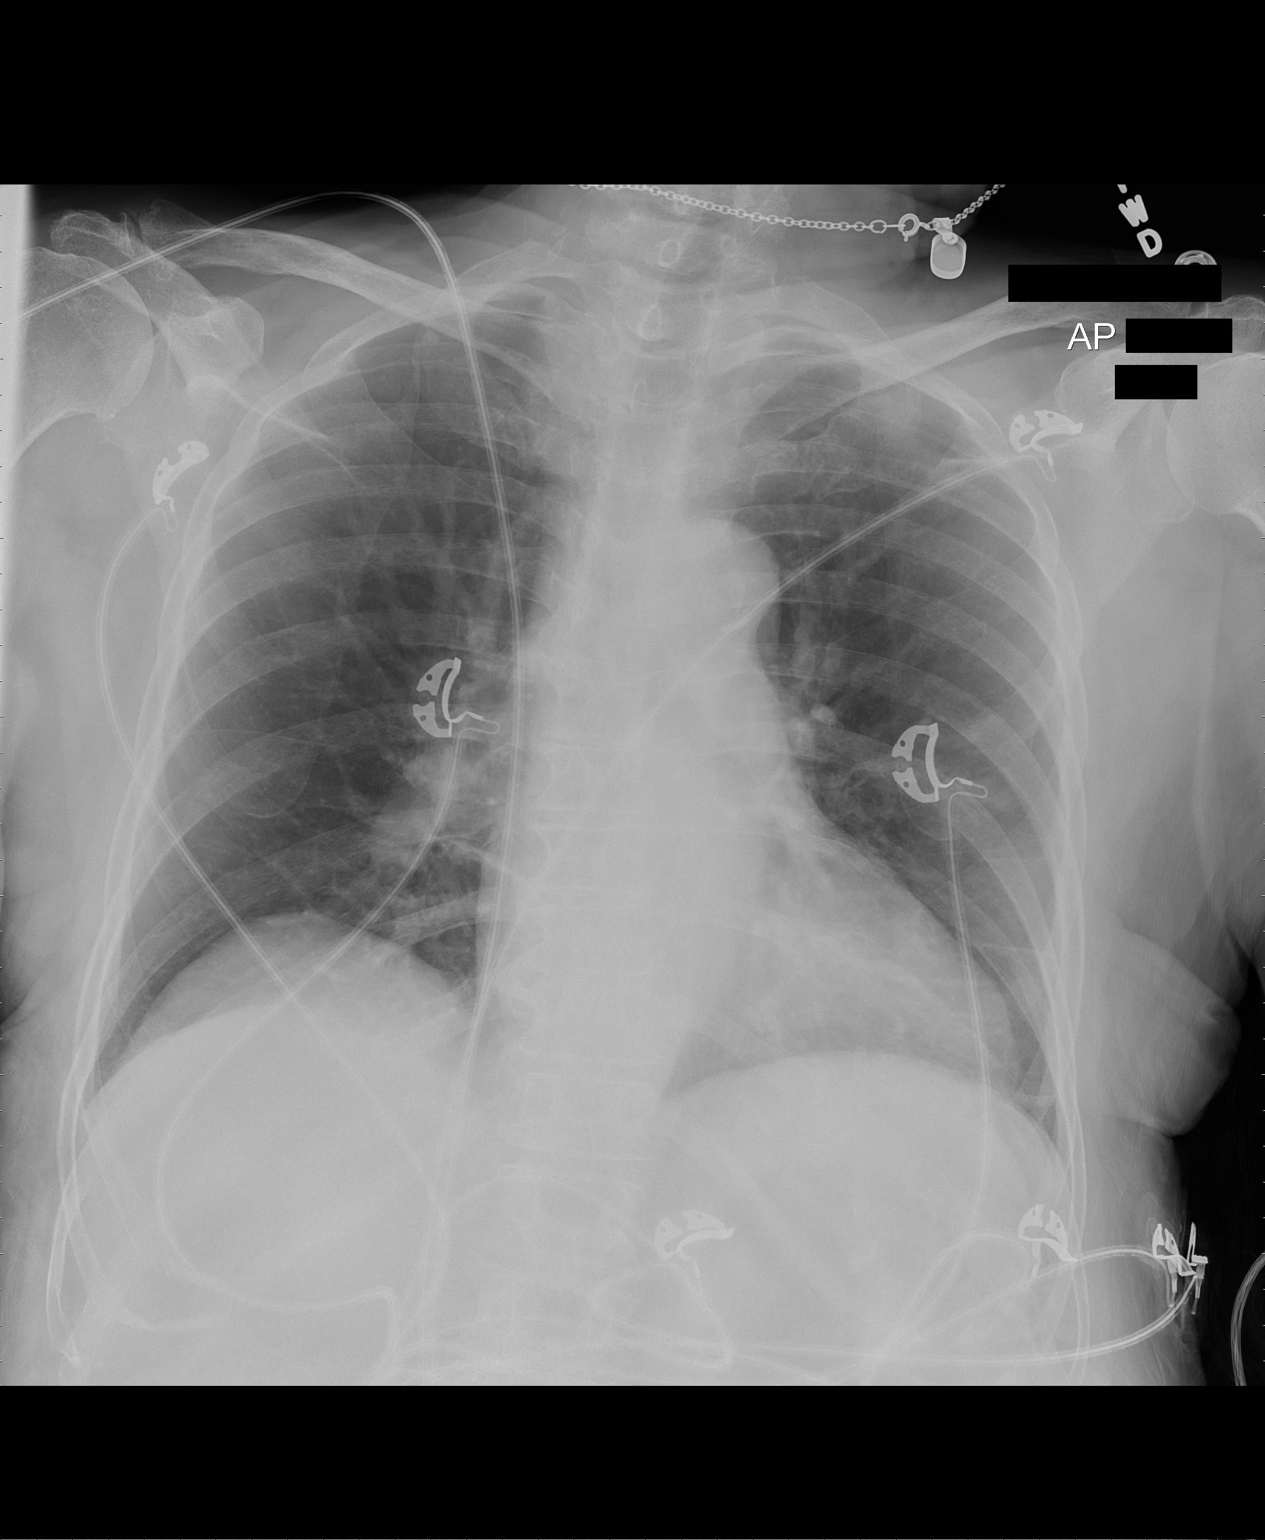

[1 of 1 positions shown; findings below may reference images not displayed]

FINDINGS: Trachea is midline.  Heart size stable.  Lungs are clear.
No pleural fluid.
IMPRESSION: No acute findings.

## 2011-06-18 IMAGING — CR DG CHEST 2V
2 series · 2 of 2 positions shown · non-contrast
Comparison: Radiograph 04/26/2011

CLINICAL DATA: Cough, short of breath

CHEST - 2 VIEW

[view not recorded (1 of 2)]
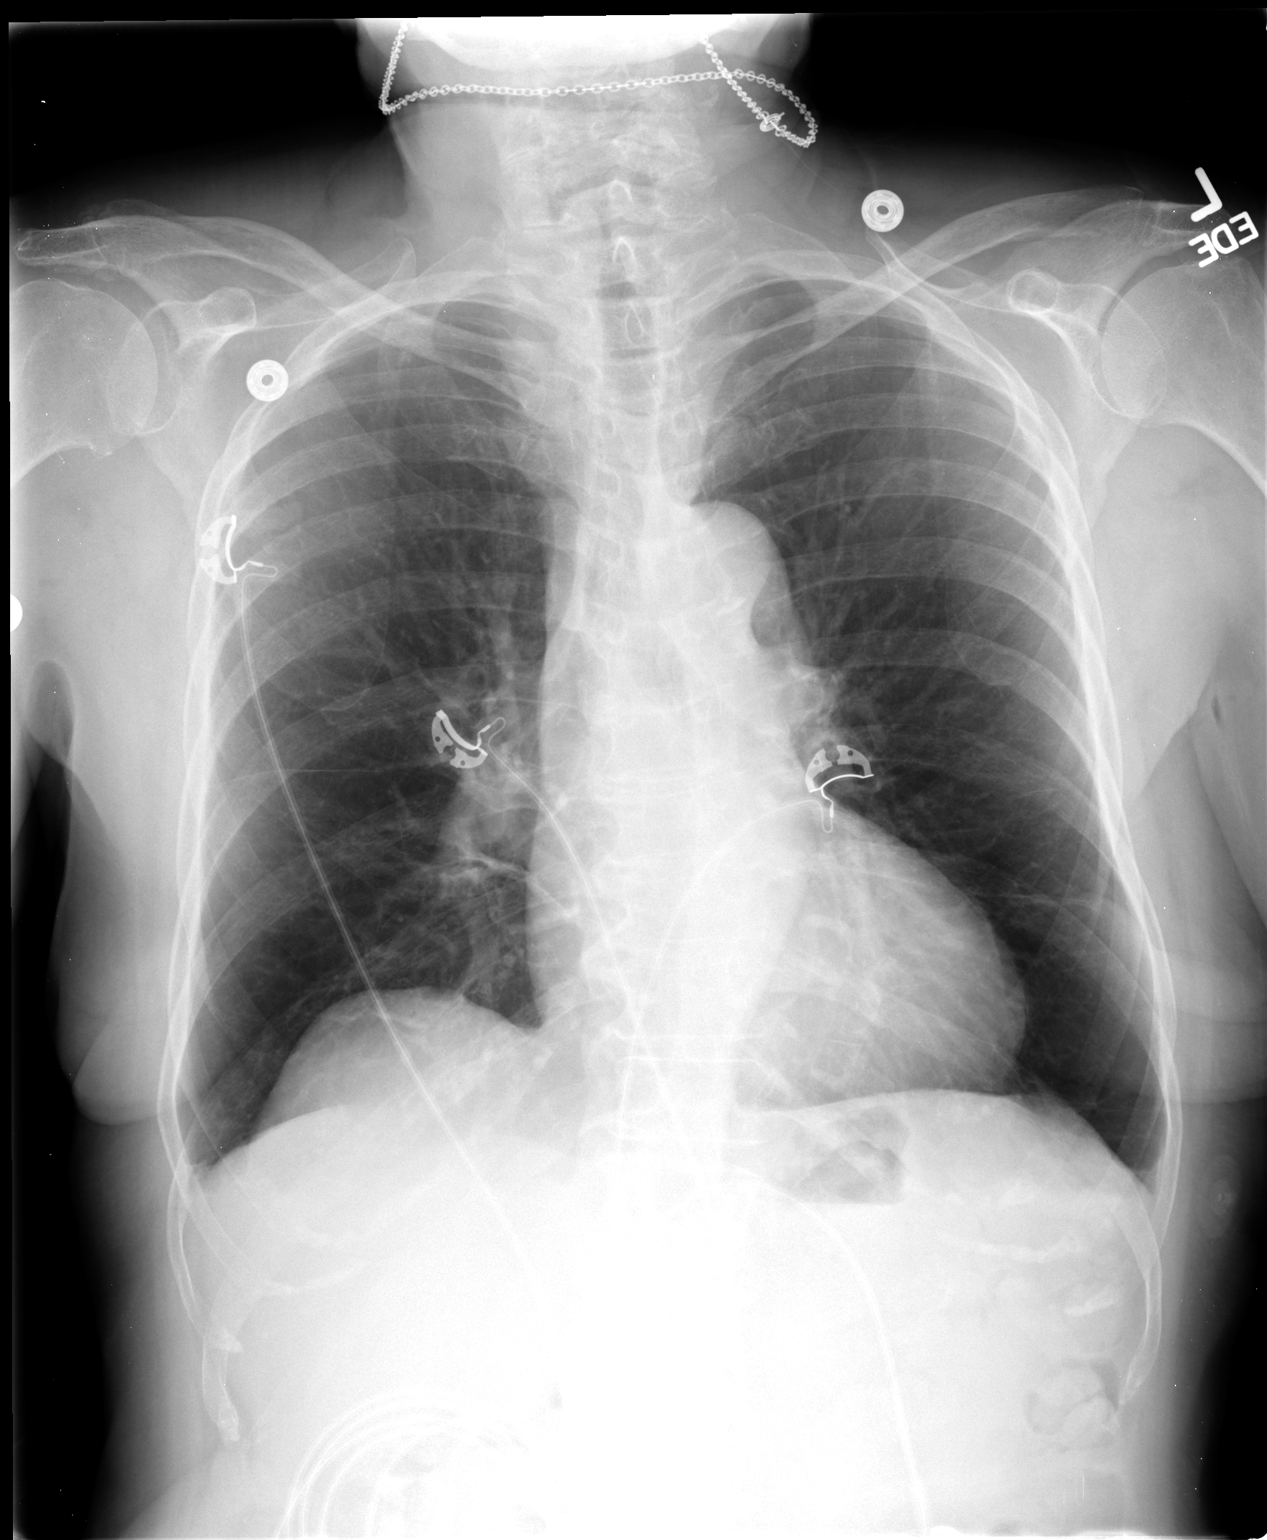

[view not recorded (2 of 2)]
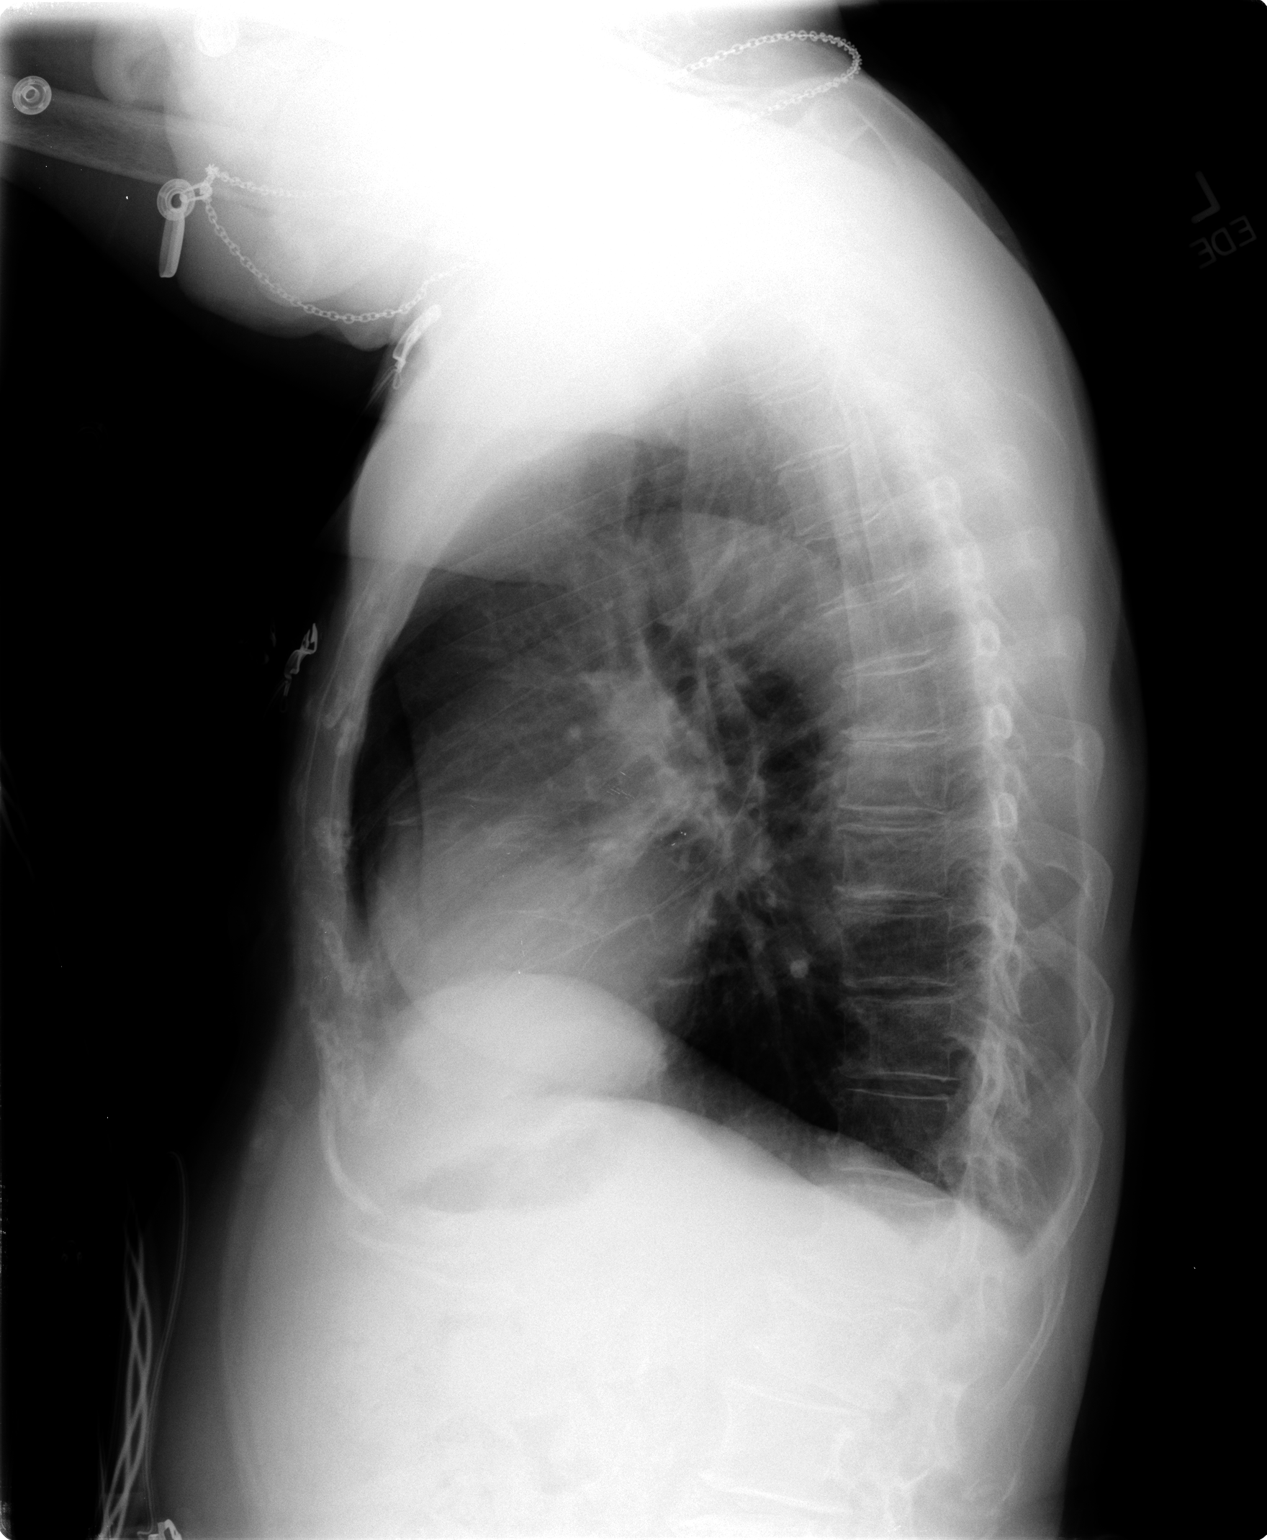

[2 of 2 positions shown; findings below may reference images not displayed]

FINDINGS: Normal mediastinum and cardiac silhouette.  Costophrenic
angles are clear.  No effusion, infiltrate, or pneumothorax.  Lungs
are hyperinflated.
IMPRESSION: Hyperinflated lungs.  No acute cardiopulmonary process.

## 2011-07-12 ENCOUNTER — Inpatient Hospital Stay (INDEPENDENT_AMBULATORY_CARE_PROVIDER_SITE_OTHER)
Admission: RE | Admit: 2011-07-12 | Discharge: 2011-07-12 | Disposition: A | Discharge status: Home / Self Care | Payer: Medicare Other | Source: Ambulatory Visit | Attending: Family Medicine | Admitting: Family Medicine

## 2011-07-12 ENCOUNTER — Ambulatory Visit (INDEPENDENT_AMBULATORY_CARE_PROVIDER_SITE_OTHER): Payer: Medicare Other

## 2011-07-12 DIAGNOSIS — M549 Dorsalgia, unspecified: Secondary | ICD-10-CM

## 2011-07-20 ENCOUNTER — Ambulatory Visit (INDEPENDENT_AMBULATORY_CARE_PROVIDER_SITE_OTHER): Payer: Medicare Other | Admitting: Family Medicine

## 2011-07-20 ENCOUNTER — Encounter: Payer: Self-pay | Admitting: Family Medicine

## 2011-07-20 VITALS — BP 165/80 | HR 87 | Wt 126.4 lb

## 2011-07-20 DIAGNOSIS — F17200 Nicotine dependence, unspecified, uncomplicated: Secondary | ICD-10-CM

## 2011-07-20 DIAGNOSIS — M129 Arthropathy, unspecified: Secondary | ICD-10-CM

## 2011-07-20 DIAGNOSIS — E119 Type 2 diabetes mellitus without complications: Secondary | ICD-10-CM

## 2011-07-20 DIAGNOSIS — I10 Essential (primary) hypertension: Secondary | ICD-10-CM

## 2011-07-20 DIAGNOSIS — Z720 Tobacco use: Secondary | ICD-10-CM

## 2011-07-20 MED ORDER — LOSARTAN POTASSIUM-HCTZ 50-12.5 MG PO TABS: 1.0000 | ORAL_TABLET | Freq: Every day | ORAL | Status: DC

## 2011-07-20 NOTE — Assessment & Plan Note (Signed)
I think [name]  stiffness is due to arthritis.  We will treat with over-the-counter Tylenol.  If her pain continues to be present due to bothersome worsening we'll consider joint replacement surgery however at this time she's well-managed with simple over-the-counter Tylenol

## 2011-07-20 NOTE — Progress Notes (Signed)
Ms. Heatley presents to clinic to followup  Diabetes: Restarted glipizide in addition to her metformin.  Her blood sugar ranges 113-167 with an average in the 130s. No lows and no polyuria or polydipsia.  Feels well  Hypertension: Taking hydrochlorothiazide Coreg Norvasc. Denies any chest pain palpitations shortness of breath or syncope.  Has an allergy to lisinopril which was a cough denies any history of angioedema or symptomatic hyperkalemia.    Stiffness: Notes morning stiffness in her bilateral hips and knees, in addition to mild chronic pain in both knees and both hips area and this is usually relieved with Tylenol however she sometimes takes hydrocodone for this.  Has had a right knee replacement and is not interested in further joint replacements at this time.  Oral tobacco: Continues to dip has done so since she was 77 years old and does not want to quit.  PMH reviewed.  ROS as above otherwise neg  Exam:  BP 165/80  Pulse 87  Wt 126 lb 6.4 oz (57.335 kg) Gen: Well NAD HEENT: EOMI,  MMM Lungs: CTABL Nl WOB Heart: RRR no MRG Abd: NABS, NT, ND Exts: Non edematous BL  LE, warm and well perfused.  MSK: Mild crepitations in both knees bilaterally on range of motion. Range of motion of the hip is limited internally and externally to mild degree. No significant joint swelling.

## 2011-07-20 NOTE — Assessment & Plan Note (Signed)
Doing better after restarting glipizide.  Blood sugar in good range. Plan to continue both metformin and glipizide.

## 2011-07-20 NOTE — Assessment & Plan Note (Signed)
Blood pressure continues to be elevated at this visit in the last visit Plan to add losartan. Will add hydrochlorothiazide into the losartan to make a combo pill and discontinue her all other independent HCTZ prescription. We'll followup in one month.

## 2011-07-20 NOTE — Assessment & Plan Note (Signed)
No desire to quit at this time we will follow

## 2011-07-20 NOTE — Patient Instructions (Signed)
Thank you for coming in today. Stop taking your hydrochlorothiazide. I started a new medicine called Hyzaar.   It is losartan and hydrochlorothiazide together.  This medicine will be more expensive than hydrochlorothiazide but your insurance should cover it. If you're pharmacy gives you trouble please let me know. I would like to see you back in one month to followup your blood pressure. Let me know if you have chest pains or feel sick otherwise. Take Care

## 2011-08-03 LAB — CBC
HCT: 35.4 — ABNORMAL LOW
Hemoglobin: 11.4 — ABNORMAL LOW
MCHC: 32.2
MCHC: 32.3
MCV: 72.3 — ABNORMAL LOW
Platelets: 219
Platelets: 235
RBC: 4.9
RDW: 15.6 — ABNORMAL HIGH
RDW: 15.7 — ABNORMAL HIGH
WBC: 6.3

## 2011-08-03 LAB — COMPREHENSIVE METABOLIC PANEL
ALT: 8
Albumin: 3.4 — ABNORMAL LOW
Alkaline Phosphatase: 62
Calcium: 10.1
GFR calc Af Amer: >60
Potassium: 3.3 — ABNORMAL LOW
Sodium: 136
Total Protein: 7.9

## 2011-08-03 LAB — URINALYSIS, ROUTINE W REFLEX MICROSCOPIC
Bilirubin Urine: NEGATIVE
Glucose, UA: NEGATIVE
Ketones, ur: NEGATIVE
Leukocytes, UA: NEGATIVE
Nitrite: NEGATIVE
Protein, ur: 30 — AB
Specific Gravity, Urine: 1.02
Urobilinogen, UA: 0.2
pH: 6.5

## 2011-08-03 LAB — URINE MICROSCOPIC-ADD ON

## 2011-08-04 LAB — POCT I-STAT CREATININE
Creatinine, Ser: 0.9
Operator id: 257131

## 2011-08-04 LAB — DIFFERENTIAL
Eosinophils Absolute: 0.2
Eosinophils Relative: 3
Lymphocytes Relative: 35
Lymphs Abs: 1.8
Monocytes Absolute: 0.4
Monocytes Relative: 8

## 2011-08-04 LAB — I-STAT 8, (EC8 V) (CONVERTED LAB)
Acid-Base Excess: 2
Bicarbonate: 25.6 — ABNORMAL HIGH
Glucose, Bld: 134 — ABNORMAL HIGH
HCT: 35 — ABNORMAL LOW
Hemoglobin: 11.9 — ABNORMAL LOW
Operator id: 257131
Potassium: 3.4 — ABNORMAL LOW
Sodium: 139
TCO2: 27

## 2011-08-04 LAB — CBC
HCT: 31.9 — ABNORMAL LOW
Hemoglobin: 10.2 — ABNORMAL LOW
MCV: 72.3 — ABNORMAL LOW
Platelets: 254
WBC: 5.1

## 2011-08-04 LAB — POCT CARDIAC MARKERS: Troponin i, poc: <0.05

## 2011-08-10 LAB — CBC
Hemoglobin: 10.2 — ABNORMAL LOW
MCHC: 32.6
Platelets: 234
RDW: 15.2

## 2011-08-10 LAB — DIFFERENTIAL
Basophils Absolute: 0
Basophils Relative: 1
Monocytes Absolute: 0.4
Neutro Abs: 3.3

## 2011-08-10 LAB — POCT I-STAT, CHEM 8
Calcium, Ion: 1.26
Chloride: 104
HCT: 34 — ABNORMAL LOW
Sodium: 141
TCO2: 26

## 2011-08-18 ENCOUNTER — Encounter: Payer: Self-pay | Admitting: Family Medicine

## 2011-08-18 ENCOUNTER — Ambulatory Visit (INDEPENDENT_AMBULATORY_CARE_PROVIDER_SITE_OTHER): Payer: Medicare Other | Admitting: Family Medicine

## 2011-08-18 VITALS — BP 134/60 | HR 72 | Wt 127.7 lb

## 2011-08-18 DIAGNOSIS — M79609 Pain in unspecified limb: Secondary | ICD-10-CM

## 2011-08-18 DIAGNOSIS — I10 Essential (primary) hypertension: Secondary | ICD-10-CM

## 2011-08-18 DIAGNOSIS — Z23 Encounter for immunization: Secondary | ICD-10-CM

## 2011-08-18 DIAGNOSIS — E119 Type 2 diabetes mellitus without complications: Secondary | ICD-10-CM

## 2011-08-18 DIAGNOSIS — M79629 Pain in unspecified upper arm: Secondary | ICD-10-CM

## 2011-08-18 DIAGNOSIS — J069 Acute upper respiratory infection, unspecified: Secondary | ICD-10-CM

## 2011-08-18 MED ORDER — GABAPENTIN 100 MG PO CAPS: 100.0000 mg | ORAL_CAPSULE | Freq: Every day | ORAL | Status: DC

## 2011-08-18 MED ORDER — ALBUTEROL SULFATE HFA 108 (90 BASE) MCG/ACT IN AERS: 2.0000 | INHALATION_SPRAY | Freq: Four times a day (QID) | RESPIRATORY_TRACT | Status: DC | PRN

## 2011-08-18 MED ORDER — FLUTICASONE PROPIONATE (INHAL) 100 MCG/BLIST IN AEPB: 1.0000 | INHALATION_SPRAY | Freq: Two times a day (BID) | RESPIRATORY_TRACT | Status: DC

## 2011-08-18 NOTE — Patient Instructions (Signed)
Thank you for coming in today. Take the gabapentin at night and come see me again in 1 month.  See your surgeon in 2 weeks about your pain.  I refilled your inhalers.  Take the fluticasone daily

## 2011-08-21 DIAGNOSIS — J069 Acute upper respiratory infection, unspecified: Secondary | ICD-10-CM | POA: Insufficient documentation

## 2011-08-21 NOTE — Assessment & Plan Note (Signed)
Doing well on ARB + HCTZ.  Will follow up blood pressure in 1 month.  At goal.

## 2011-08-21 NOTE — Assessment & Plan Note (Signed)
Seems to be normal cold + cough.  Advised symptom control with tylenol. Reviewed red flags.  Will follow up in month.

## 2011-08-21 NOTE — Assessment & Plan Note (Signed)
Pain seems neuropathic in nature. No masses palpated. Obviously this area is concerning for malignancy following breast cancer and mastectomy.  Plan to treat pain with gabapentin with low starting dose and slow taper. Will also refer back to surgery for second opinion regarding this issue.  Will follow up in 1 month.

## 2011-08-21 NOTE — Progress Notes (Signed)
[name] presents to clinic for several issues: 1) Axilla pain. Present BL for about 1 week. Describes the pain as burning and tingling. Denies any one tender area. Has not been to the surgeon for this issue. Has not tried any of the OTC pain medications. No masses. No fevers or chills.   2) HTN: Doing well. Taking medications listed above. No syncope, palpitations, CP or edema.   3) Cough x 1 week. Associated with some runny nose. No dyspnea fevers or chills. No N/V/D.   PMH reviewed.  ROS as above otherwise neg Medications reviewed.  Exam:  BP 134/60  Pulse 72  Wt 127 lb 11.2 oz (57.924 kg) Gen: Well NAD HEENT: EOMI,  MMM Lungs: CTABL Nl WOB Heart: RRR no MRG Abd: NABS, NT, ND Exts: Non edematous BL  LE, warm and well perfused. Hands are NVI Axilla: Chest: Bl mastectomy scars well healed. No masses in either axilla. Skin is normal appearing. Non tender to palpitation.

## 2011-08-24 ENCOUNTER — Ambulatory Visit (INDEPENDENT_AMBULATORY_CARE_PROVIDER_SITE_OTHER): Payer: Medicare Other

## 2011-08-24 ENCOUNTER — Inpatient Hospital Stay (INDEPENDENT_AMBULATORY_CARE_PROVIDER_SITE_OTHER)
Admission: RE | Admit: 2011-08-24 | Discharge: 2011-08-24 | Disposition: A | Discharge status: Home / Self Care | Payer: Medicare Other | Source: Ambulatory Visit | Attending: Emergency Medicine | Admitting: Emergency Medicine

## 2011-08-24 DIAGNOSIS — S32009A Unspecified fracture of unspecified lumbar vertebra, initial encounter for closed fracture: Secondary | ICD-10-CM

## 2011-08-24 DIAGNOSIS — N390 Urinary tract infection, site not specified: Secondary | ICD-10-CM

## 2011-08-24 LAB — DIFFERENTIAL
Basophils Absolute: 0
Basophils Relative: 1
Eosinophils Absolute: 0.1
Eosinophils Relative: 3
Lymphs Abs: 1.2

## 2011-08-24 LAB — COMPREHENSIVE METABOLIC PANEL
ALT: 11
AST: 16
Alkaline Phosphatase: 47
CO2: 26
Chloride: 108
GFR calc Af Amer: 58 — ABNORMAL LOW
GFR calc non Af Amer: 48 — ABNORMAL LOW
Sodium: 140
Total Bilirubin: 0.5

## 2011-08-24 LAB — URINALYSIS, ROUTINE W REFLEX MICROSCOPIC
Bilirubin Urine: NEGATIVE
Ketones, ur: NEGATIVE
Nitrite: NEGATIVE
pH: 6

## 2011-08-24 LAB — POCT URINALYSIS DIP (DEVICE)
Bilirubin Urine: NEGATIVE
Glucose, UA: NEGATIVE mg/dL
Nitrite: POSITIVE — AB
Specific Gravity, Urine: 1.015 (ref 1.005–1.030)
Urobilinogen, UA: 0.2 mg/dL (ref 0.0–1.0)

## 2011-08-24 LAB — LIPASE, BLOOD: Lipase: 32

## 2011-08-24 LAB — CBC
RBC: 4.16
WBC: 4.3

## 2011-08-25 LAB — TSH: TSH: 1.539

## 2011-08-25 LAB — GRAM STAIN

## 2011-08-25 LAB — DIFFERENTIAL
Basophils Absolute: 0
Eosinophils Relative: 5
Lymphocytes Relative: 22
Lymphs Abs: 1.2
Neutro Abs: 3.5

## 2011-08-25 LAB — URINALYSIS, ROUTINE W REFLEX MICROSCOPIC
Nitrite: NEGATIVE
Specific Gravity, Urine: 1.029
Urobilinogen, UA: 1
pH: 5.5

## 2011-08-25 LAB — FERRITIN: Ferritin: 330 — ABNORMAL HIGH (ref 10–291)

## 2011-08-25 LAB — COMPREHENSIVE METABOLIC PANEL
AST: 9
Albumin: 3.1 — ABNORMAL LOW
BUN: 24 — ABNORMAL HIGH
CO2: 21
Calcium: 9.8
Chloride: 111
Creatinine, Ser: 1.36 — ABNORMAL HIGH
GFR calc Af Amer: 45 — ABNORMAL LOW
GFR calc non Af Amer: 37 — ABNORMAL LOW
Total Bilirubin: 0.4

## 2011-08-25 LAB — CULTURE, BLOOD (ROUTINE X 2)

## 2011-08-25 LAB — CARDIAC PANEL(CRET KIN+CKTOT+MB+TROPI)
Relative Index: INVALID
Troponin I: 0.02

## 2011-08-25 LAB — URINE MICROSCOPIC-ADD ON

## 2011-08-25 LAB — BASIC METABOLIC PANEL
BUN: 11
BUN: 17
CO2: 23
Calcium: 9.7
Chloride: 111
Chloride: 113 — ABNORMAL HIGH
Creatinine, Ser: 1.14
Creatinine, Ser: 1.14

## 2011-08-25 LAB — CBC
HCT: 36.2
HCT: 38.5
MCV: 74.5 — ABNORMAL LOW
MCV: 74.6 — ABNORMAL LOW
Platelets: 290
RBC: 4.85
WBC: 4.1
WBC: 5.2

## 2011-08-25 LAB — URINE CULTURE
Colony Count: 15000
Colony Count: >=100000
Special Requests: NEGATIVE

## 2011-08-25 LAB — TROPONIN I: Troponin I: 0.02

## 2011-08-27 LAB — URINE CULTURE: Colony Count: >=100000

## 2011-08-28 ENCOUNTER — Emergency Department (HOSPITAL_COMMUNITY): Payer: Medicare Other

## 2011-08-28 ENCOUNTER — Emergency Department (HOSPITAL_COMMUNITY)
Admission: EM | Admit: 2011-08-28 | Discharge: 2011-08-28 | Disposition: A | Discharge status: Home / Self Care | Payer: Medicare Other | Attending: Emergency Medicine | Admitting: Emergency Medicine

## 2011-08-28 DIAGNOSIS — E785 Hyperlipidemia, unspecified: Secondary | ICD-10-CM | POA: Insufficient documentation

## 2011-08-28 DIAGNOSIS — M1610 Unilateral primary osteoarthritis, unspecified hip: Secondary | ICD-10-CM | POA: Insufficient documentation

## 2011-08-28 DIAGNOSIS — M169 Osteoarthritis of hip, unspecified: Secondary | ICD-10-CM | POA: Insufficient documentation

## 2011-08-28 DIAGNOSIS — E119 Type 2 diabetes mellitus without complications: Secondary | ICD-10-CM | POA: Insufficient documentation

## 2011-08-28 DIAGNOSIS — I252 Old myocardial infarction: Secondary | ICD-10-CM | POA: Insufficient documentation

## 2011-08-28 DIAGNOSIS — Z7982 Long term (current) use of aspirin: Secondary | ICD-10-CM | POA: Insufficient documentation

## 2011-08-28 DIAGNOSIS — M25559 Pain in unspecified hip: Secondary | ICD-10-CM | POA: Insufficient documentation

## 2011-08-28 DIAGNOSIS — I509 Heart failure, unspecified: Secondary | ICD-10-CM | POA: Insufficient documentation

## 2011-08-28 DIAGNOSIS — Z79899 Other long term (current) drug therapy: Secondary | ICD-10-CM | POA: Insufficient documentation

## 2011-08-28 DIAGNOSIS — Z853 Personal history of malignant neoplasm of breast: Secondary | ICD-10-CM | POA: Insufficient documentation

## 2011-08-28 DIAGNOSIS — I10 Essential (primary) hypertension: Secondary | ICD-10-CM | POA: Insufficient documentation

## 2011-08-28 DIAGNOSIS — I2510 Atherosclerotic heart disease of native coronary artery without angina pectoris: Secondary | ICD-10-CM | POA: Insufficient documentation

## 2011-08-31 ENCOUNTER — Telehealth: Payer: Self-pay | Admitting: Family Medicine

## 2011-08-31 LAB — URINALYSIS, ROUTINE W REFLEX MICROSCOPIC
Bilirubin Urine: NEGATIVE
Ketones, ur: NEGATIVE
Nitrite: POSITIVE — AB
Specific Gravity, Urine: 1.025
Urobilinogen, UA: 1
pH: 6

## 2011-08-31 LAB — DIFFERENTIAL
Eosinophils Absolute: 0.1
Lymphocytes Relative: 35
Lymphs Abs: 1.3
Neutro Abs: 1.9
Neutrophils Relative %: 53

## 2011-08-31 LAB — URINE MICROSCOPIC-ADD ON

## 2011-08-31 LAB — I-STAT 8, (EC8 V) (CONVERTED LAB)
BUN: 15
Bicarbonate: 19.1 — ABNORMAL LOW
Chloride: 111
HCT: 34 — ABNORMAL LOW
Hemoglobin: 11.6 — ABNORMAL LOW
Operator id: 234501
Sodium: 141

## 2011-08-31 LAB — D-DIMER, QUANTITATIVE: D-Dimer, Quant: 6.94 — ABNORMAL HIGH

## 2011-08-31 LAB — CBC
Platelets: 246
WBC: 3.7 — ABNORMAL LOW

## 2011-08-31 LAB — B-NATRIURETIC PEPTIDE (CONVERTED LAB): Pro B Natriuretic peptide (BNP): 96

## 2011-08-31 LAB — POCT I-STAT CREATININE: Creatinine, Ser: 1.2

## 2011-08-31 LAB — POCT CARDIAC MARKERS: Troponin i, poc: <0.05

## 2011-08-31 NOTE — Telephone Encounter (Signed)
Will forward to Dr Corey 

## 2011-08-31 NOTE — Telephone Encounter (Signed)
Wants to talk Dr. Denyse Amass about getting a wheelchair.

## 2011-09-01 NOTE — Telephone Encounter (Signed)
I called the patient. She will contact her home health agency to get me the form. Once I have the form she will come in for office visit and we will fill the form out together.

## 2011-09-02 IMAGING — CR DG CHEST 2V
2 series · 2 of 2 positions shown · non-contrast
Comparison: 04/27/2011

CLINICAL DATA: Right posterior chest pain.

CHEST - 2 VIEW

[view not recorded (1 of 2)]
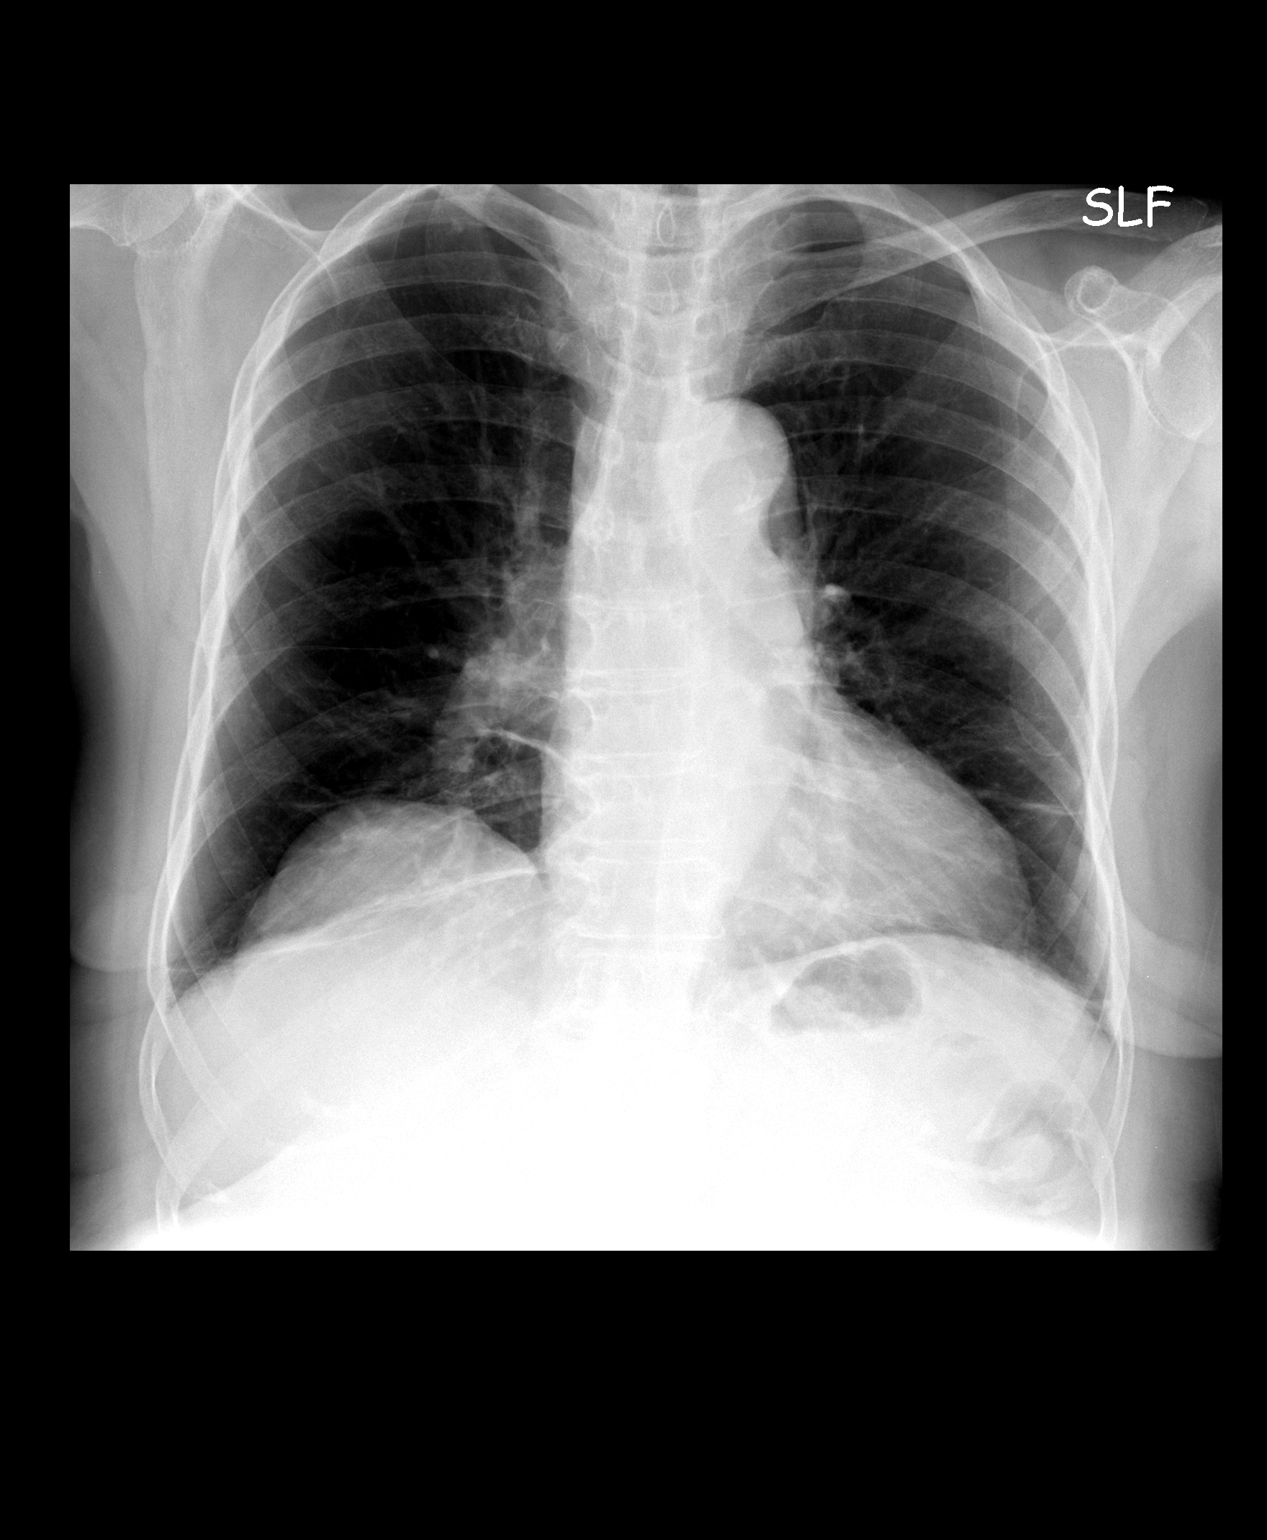

[view not recorded (2 of 2)]
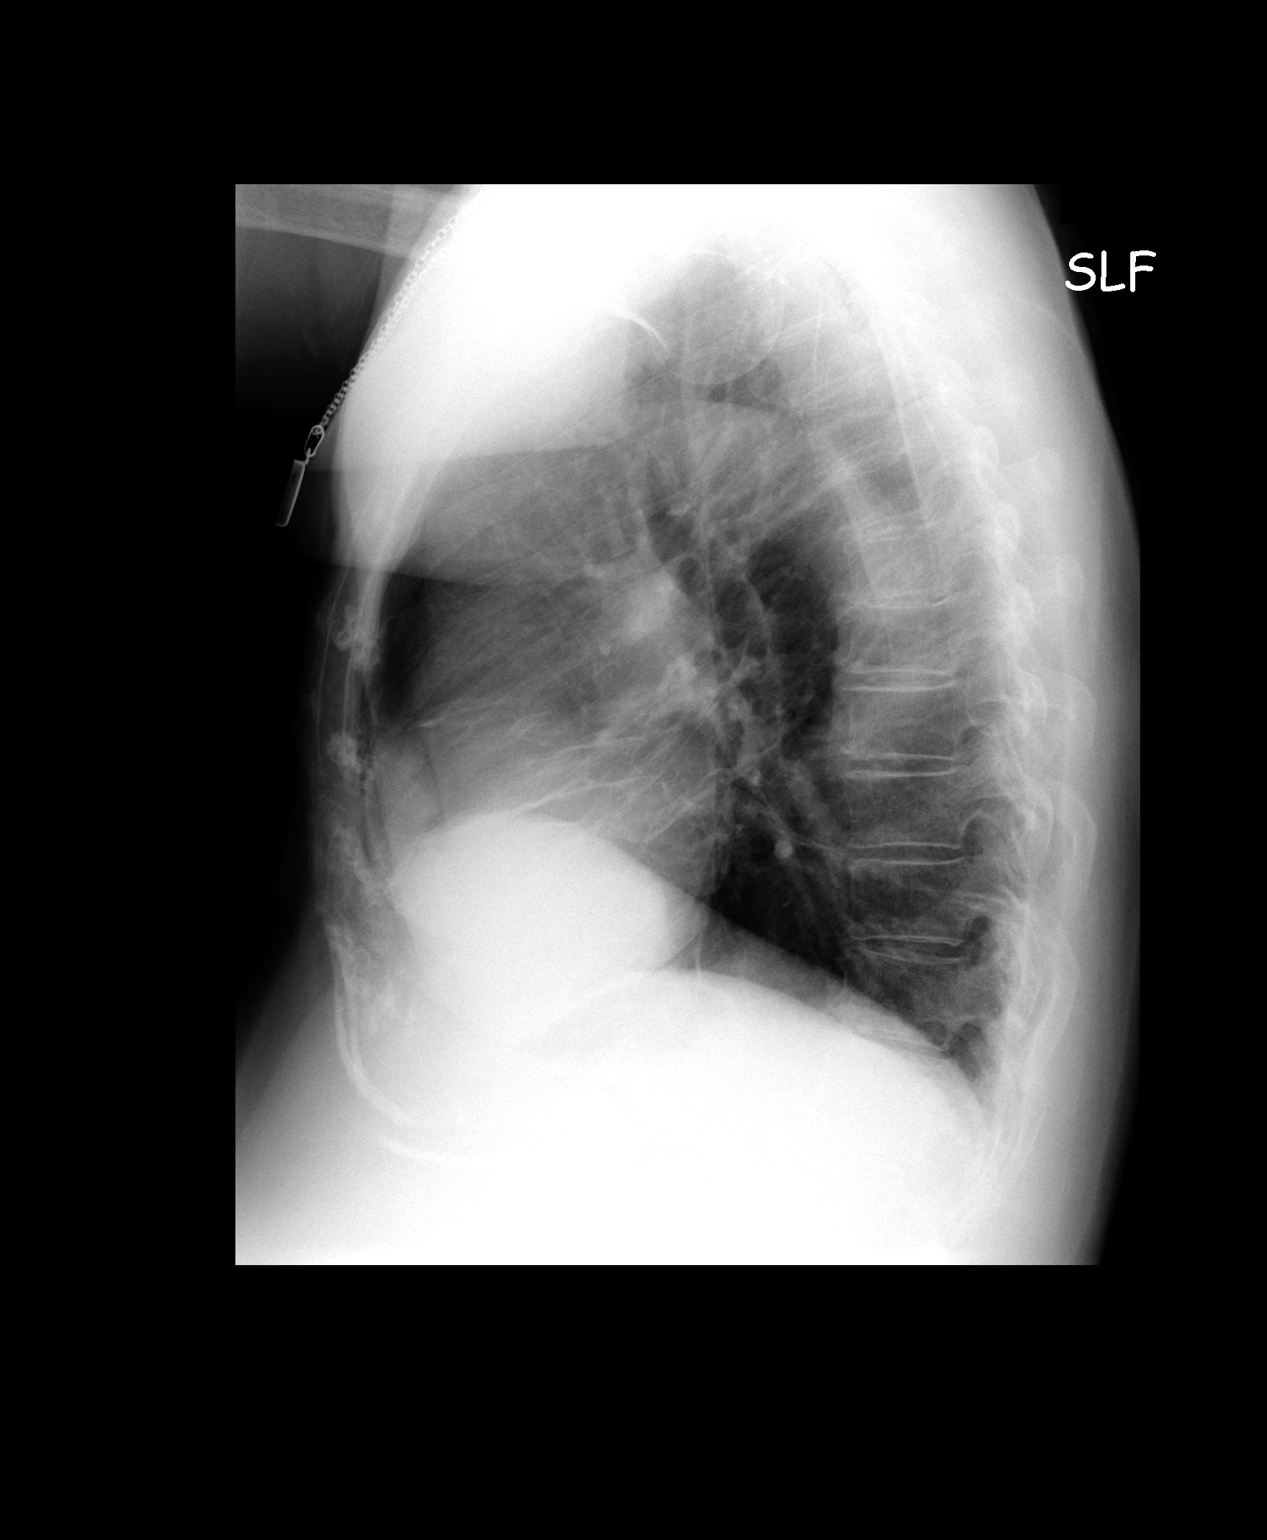

[2 of 2 positions shown; findings below may reference images not displayed]

FINDINGS: Linear areas of subsegmental atelectasis in the lower
lobes bilaterally.  Heart is borderline in size.  No effusions or
edema.  No acute bony abnormality.
IMPRESSION: Bibasilar subsegmental atelectasis.  Borderline heart size.

## 2011-09-21 ENCOUNTER — Ambulatory Visit: Payer: Medicare Other | Admitting: Family Medicine

## 2011-09-25 ENCOUNTER — Encounter: Payer: Self-pay | Admitting: Family Medicine

## 2011-09-25 ENCOUNTER — Ambulatory Visit (INDEPENDENT_AMBULATORY_CARE_PROVIDER_SITE_OTHER): Payer: Medicare Other | Admitting: Family Medicine

## 2011-09-25 VITALS — BP 155/74 | HR 80 | Wt 127.0 lb

## 2011-09-25 DIAGNOSIS — M79629 Pain in unspecified upper arm: Secondary | ICD-10-CM

## 2011-09-25 DIAGNOSIS — E119 Type 2 diabetes mellitus without complications: Secondary | ICD-10-CM

## 2011-09-25 DIAGNOSIS — M79609 Pain in unspecified limb: Secondary | ICD-10-CM

## 2011-09-25 DIAGNOSIS — M129 Arthropathy, unspecified: Secondary | ICD-10-CM

## 2011-09-25 DIAGNOSIS — I10 Essential (primary) hypertension: Secondary | ICD-10-CM

## 2011-09-25 DIAGNOSIS — G4700 Insomnia, unspecified: Secondary | ICD-10-CM | POA: Insufficient documentation

## 2011-09-25 MED ORDER — TRAMADOL HCL 50 MG PO TABS: 50.0000 mg | ORAL_TABLET | Freq: Four times a day (QID) | ORAL | Status: DC | PRN

## 2011-09-25 NOTE — Assessment & Plan Note (Signed)
Not sure the etiology. It's only been going on for one week. Is still bad in one month we'll consider trazodone. Working on sleep hygiene over the next month

## 2011-09-25 NOTE — Patient Instructions (Signed)
Thank you for coming in today. Please come back in 1 month.  Take tramadol for pain.  At bedtime make sure your room is dark. Don't watch TV or read a book in bed.  If it keeps on bothering you in 1 month we will start some medicine.

## 2011-09-25 NOTE — Assessment & Plan Note (Signed)
Blood pressure elevated today. I am concerned that her blood pressure is lower at home. Will recheck in one month.  His blood pressure recalcitrant will refer to pharmacy clinic for ambulatory home blood pressure monitoring.  Danger here is syncope due to hypotension versus one or 2 months of a higher than normal blood pressure. We'll go slowly

## 2011-09-25 NOTE — Progress Notes (Signed)
[name] presents to clinic to followup her hypertension and musculoskeletal pain.  1) hypertension. Taking medications as listed below. Does not measure her blood pressure at home. Denies any palpitations syncope dyspnea edema or chest pain.    2) MSK pain: Notes that her bilateral shoulder pain has resolved however does note continued hips and knees pain with activities of daily living. She notes that Tylenol does help but Vicodin seemed to do the best. She denies any falls or extreme pain.  3) insomnia: Resident for the past week. She denies any heavy increase of caffeine or alcohol. She sleeps in a dark room with a TV or books before bedtime. She doesn't want to take any sleeping medicine at this time.  PMH reviewed.  ROS as above otherwise neg Medications reviewed. Current Outpatient Prescriptions  Medication Sig Dispense Refill  . acetaminophen (TYLENOL) 325 MG tablet Take 2 tablets (650 mg total) by mouth 3 (three) times daily. As needed for back pain.  100 tablet  2  . albuterol (VENTOLIN HFA) 108 (90 BASE) MCG/ACT inhaler Inhale 2 puffs into the lungs every 6 (six) hours as needed for wheezing.  1 Inhaler  2  . amLODipine (NORVASC) 10 MG tablet TAKE 1 TABLET BY MOUTH ONCE DAILY  30 tablet  6  . aspirin (ASPIRIN CHILDRENS) 81 MG chewable tablet Chew 81 mg by mouth daily.        . carvedilol (COREG) 12.5 MG tablet Take 12.5 mg by mouth 2 (two) times daily.        . clopidogrel (PLAVIX) 75 MG tablet Take 75 mg by mouth daily.        . CRESTOR 20 MG tablet TAKE 1 TABLET BY MOUTH DAILY  30 tablet  5  . docusate sodium (COLACE) 100 MG capsule Take 100 mg by mouth 2 (two) times daily. For stool softner       . fluticasone (FLONASE) 50 MCG/ACT nasal spray 2 sprays. Each nostril daily       . Fluticasone Propionate, Inhal, (FLOVENT DISKUS) 100 MCG/BLIST AEPB Inhale 1 puff into the lungs 2 (two) times daily.  60 each  6  . glipiZIDE (GLUCOTROL) 5 MG tablet Take 1 tablet (5 mg total) by mouth  2 (two) times daily before a meal.  60 tablet  6  . loratadine (CLARITIN) 10 MG tablet Take 1 tablet (10 mg total) by mouth daily.  30 tablet  12  . metFORMIN (GLUCOPHAGE) 500 MG tablet Take 1 tablet (500 mg total) by mouth 2 (two) times daily with a meal.  60 tablet  11  . methocarbamol (ROBAXIN) 500 MG tablet       . PATADAY 0.2 % SOLN       . polyethylene glycol (MIRALAX) powder Take 17 g by mouth daily. Mix in 8 oz glass of water. Dispense 1 month       . ranitidine (ZANTAC) 150 MG capsule Take 1 capsule (150 mg total) by mouth 2 (two) times daily.  60 capsule  6  . tamoxifen (NOLVADEX) 20 MG tablet       . gabapentin (NEURONTIN) 100 MG capsule Take 1 capsule (100 mg total) by mouth at bedtime.  30 capsule  1  . HYDROcodone-acetaminophen (NORCO) 5-325 MG per tablet 1/2 tablet (half a pill) Every 4-6 hours as needed for pain. May cause drowsiness.  Take with food.  30 tablet  2  . losartan-hydrochlorothiazide (HYZAAR) 50-12.5 MG per tablet Take 1 tablet by mouth daily.  30  tablet  2  . traMADol (ULTRAM) 50 MG tablet Take 1 tablet (50 mg total) by mouth every 6 (six) hours as needed for pain. Maximum dose= 8 tablets per day  30 tablet  1    Exam:  BP 155/74  Pulse 80  Wt 127 lb (57.607 kg) Gen: Well NAD HEENT: EOMI,  MMM Lungs: CTABL Nl WOB Heart: RRR no MRG Abd: NABS, NT, ND Exts: Non edematous BL  LE, warm and well perfused.

## 2011-09-25 NOTE — Assessment & Plan Note (Signed)
Osteoarthritis. Advised Tylenol and we'll provide tramadol for significant pain. Plan to followup in one month

## 2011-10-04 ENCOUNTER — Other Ambulatory Visit: Payer: Self-pay | Admitting: Family Medicine

## 2011-10-04 MED ORDER — CARVEDILOL 12.5 MG PO TABS: 12.5000 mg | ORAL_TABLET | Freq: Two times a day (BID) | ORAL | Status: DC

## 2011-10-12 ENCOUNTER — Observation Stay (HOSPITAL_COMMUNITY)
Admission: EM | Admit: 2011-10-12 | Discharge: 2011-10-13 | Disposition: A | Discharge status: Home / Self Care | Payer: Medicare Other | Attending: Emergency Medicine | Admitting: Emergency Medicine

## 2011-10-12 ENCOUNTER — Emergency Department (HOSPITAL_COMMUNITY): Payer: Medicare Other

## 2011-10-12 ENCOUNTER — Encounter (HOSPITAL_COMMUNITY): Payer: Self-pay | Admitting: *Deleted

## 2011-10-12 DIAGNOSIS — M7989 Other specified soft tissue disorders: Secondary | ICD-10-CM | POA: Insufficient documentation

## 2011-10-12 DIAGNOSIS — M79601 Pain in right arm: Secondary | ICD-10-CM

## 2011-10-12 DIAGNOSIS — L0390 Cellulitis, unspecified: Secondary | ICD-10-CM

## 2011-10-12 DIAGNOSIS — I509 Heart failure, unspecified: Secondary | ICD-10-CM | POA: Insufficient documentation

## 2011-10-12 DIAGNOSIS — M25429 Effusion, unspecified elbow: Secondary | ICD-10-CM | POA: Insufficient documentation

## 2011-10-12 DIAGNOSIS — I10 Essential (primary) hypertension: Secondary | ICD-10-CM | POA: Insufficient documentation

## 2011-10-12 DIAGNOSIS — I2510 Atherosclerotic heart disease of native coronary artery without angina pectoris: Secondary | ICD-10-CM | POA: Insufficient documentation

## 2011-10-12 DIAGNOSIS — M79609 Pain in unspecified limb: Principal | ICD-10-CM | POA: Insufficient documentation

## 2011-10-12 DIAGNOSIS — E119 Type 2 diabetes mellitus without complications: Secondary | ICD-10-CM | POA: Insufficient documentation

## 2011-10-12 LAB — DIFFERENTIAL
Basophils Relative: 0 % (ref 0–1)
Eosinophils Relative: 1 % (ref 0–5)
Lymphs Abs: 1.6 10*3/uL (ref 0.7–4.0)
Monocytes Absolute: 0.4 10*3/uL (ref 0.1–1.0)
Monocytes Relative: 6 % (ref 3–12)
Neutro Abs: 4.1 10*3/uL (ref 1.7–7.7)

## 2011-10-12 LAB — CBC
HCT: 30.4 % — ABNORMAL LOW (ref 36.0–46.0)
MCH: 22.8 pg — ABNORMAL LOW (ref 26.0–34.0)
MCV: 69.4 fL — ABNORMAL LOW (ref 78.0–100.0)
RBC: 4.38 MIL/uL (ref 3.87–5.11)
WBC: 6.2 10*3/uL (ref 4.0–10.5)

## 2011-10-12 LAB — BASIC METABOLIC PANEL
BUN: 15 mg/dL (ref 6–23)
CO2: 23 mEq/L (ref 19–32)
Calcium: 9.9 mg/dL (ref 8.4–10.5)
Chloride: 100 mEq/L (ref 96–112)
Creatinine, Ser: 0.77 mg/dL (ref 0.50–1.10)
Glucose, Bld: 169 mg/dL — ABNORMAL HIGH (ref 70–99)

## 2011-10-12 MED ORDER — OXYCODONE-ACETAMINOPHEN 5-325 MG PO TABS
1.0000 | ORAL_TABLET | Freq: Once | ORAL | Status: AC
  Administered 2011-10-12: 1 via ORAL
  Filled 2011-10-12: qty 1

## 2011-10-12 MED ORDER — CLINDAMYCIN PHOSPHATE 600 MG/50ML IV SOLN
600.0000 mg | Freq: Once | INTRAVENOUS | Status: AC
  Administered 2011-10-12: 600 mg via INTRAVENOUS
  Filled 2011-10-12: qty 50

## 2011-10-12 MED ORDER — ONDANSETRON 4 MG PO TBDP
8.0000 mg | ORAL_TABLET | Freq: Once | ORAL | Status: AC
  Administered 2011-10-12: 8 mg via ORAL
  Filled 2011-10-12: qty 2

## 2011-10-12 NOTE — ED Notes (Signed)
Pt has been having increasing pain and swelling in her right hand.  Pt has a warm, swollen painful right hand.  Not associated with trauma, no boil.

## 2011-10-12 NOTE — ED Notes (Signed)
Pt sone Richard cell for updates [phone]. Pt son larry for updates [phone]

## 2011-10-12 NOTE — ED Notes (Signed)
Pt given sandwich and assisted with eating.

## 2011-10-12 NOTE — ED Notes (Signed)
Pt state that she woke up on Friday was not able to move her left hand. Pt states that her hand was hurting and the pain has increased and moved up her left arm to her elbow. Pt has a swollen left hand, redness around the outside of her hand. Pt able to move left arm and hand but is also not able to fully grasp with left and due to pain Pt denies any trauma to hand or hitting hand. Pt alert and oriented and able to move all extremities and follow commands.

## 2011-10-12 NOTE — ED Provider Notes (Signed)
History     CSN: [id number] Arrival date & time: 10/12/2011  3:25 PM   First MD Initiated Contact with Patient 10/12/11 1926      Chief Complaint  Patient presents with  . Hand Pain    associated with swelling in her hand and some nausea    (Consider location/radiation/quality/duration/timing/severity/associated sxs/prior treatment) HPI Comments: Pt state that her right hand pain started 1 week ago on Friday. It began with a burning type sensation & in time began to swell, progress up the arm & the pain continues to worsen. Today she presents with hand wrist and elbow pain, swelling, erythema, and warmth. Pt denies fever, night sweats, & chills. Reports decreased ROM.   Patient is a 77 y.o. female presenting with hand pain. The history is provided by the patient.  Hand Pain    Past Medical History  Diagnosis Date  . Breast cancer 10/2010    Invasive Ductal Carcinoma, s/p bilateral mastectomy, now on Tamoxifen. Followed by Dr Dalene Carrow.   . Diabetes mellitus   . Hypertension   . Hyperlipidemia   . Anemia     Iron def anemia  . CAD (coronary artery disease)   . CHF (congestive heart failure)     Systolic   . History of colon cancer   . Neuropathy   . Allergy   . GERD (gastroesophageal reflux disease)   . Hiatal hernia   . Diverticulosis   . Asthma   . Arthritis   . H/O: GI bleed   . Breast cancer 2011    s/p Bilateral masectomy    Past Surgical History  Procedure Date  . Breast masectomy 10/2010    Bilateral masectomy by Dr Ezzard Standing s/p invasive ductal carcinoma.  . Cardiac  catherization 2007    Severe 3-vessel disease.  EF 20-25%.  . Esophagogastroduodenoscopy 2001    Esophageal Tear  . Total knee arthroplasty 2001    Family History  Problem Relation Age of Onset  . Sickle cell anemia Other   . Heart disease Mother   . Heart disease Father     History  Substance Use Topics  . Smoking status: Never Smoker   . Smokeless tobacco: Current User    Types:  Snuff  . Alcohol Use: No    OB History    Grav Para Term Preterm Abortions TAB SAB Ect Mult Living                  Review of Systems  Allergies  Lisinopril and Peanuts  Home Medications   Current Outpatient Rx  Name Route Sig Dispense Refill  . ACETAMINOPHEN 500 MG PO TABS Oral Take 1,000 mg by mouth every 6 (six) hours as needed. For pain     . ALBUTEROL SULFATE HFA 108 (90 BASE) MCG/ACT IN AERS Inhalation Inhale 2 puffs into the lungs every 6 (six) hours as needed. For shortness of breath     . AMLODIPINE BESYLATE 10 MG PO TABS Oral Take 10 mg by mouth daily.     . ASPIRIN 81 MG PO CHEW Oral Chew 81 mg by mouth daily.      Marland Kitchen CARVEDILOL 12.5 MG PO TABS Oral Take 12.5 mg by mouth 2 (two) times daily.      Marland Kitchen CLOPIDOGREL BISULFATE 75 MG PO TABS Oral Take 75 mg by mouth daily.      Marland Kitchen DOCUSATE SODIUM 100 MG PO CAPS Oral Take 100 mg by mouth 2 (two) times daily. For stool softner     .  FLUTICASONE PROPIONATE 50 MCG/ACT NA SUSP Nasal Place 2 sprays into the nose daily as needed. For nasal congestion    . FLUTICASONE PROPIONATE (INHAL) 100 MCG/BLIST IN AEPB Inhalation Inhale 1 puff into the lungs 2 (two) times daily. 60 each 6  . GLIPIZIDE 5 MG PO TABS Oral Take 1 tablet (5 mg total) by mouth 2 (two) times daily before a meal. 60 tablet 6  . HYDROCHLOROTHIAZIDE 25 MG PO TABS Oral Take 25 mg by mouth daily.      Marland Kitchen HYDROCODONE-ACETAMINOPHEN 5-325 MG PO TABS Oral Take 0.5 tablets by mouth every 4 (four) hours as needed. For pain     . METFORMIN HCL 500 MG PO TABS Oral Take 1 tablet (500 mg total) by mouth 2 (two) times daily with a meal. 60 tablet 11  . PATADAY 0.2 % OP SOLN Both Eyes Place 1 drop into both eyes daily as needed. For itchy eyes    . POLYETHYLENE GLYCOL 3350 PO POWD Oral Take 17 g by mouth daily as needed. For constipation. Mix in 8 oz glass of water.    Marland Kitchen RANITIDINE HCL 150 MG PO CAPS Oral Take 1 capsule (150 mg total) by mouth 2 (two) times daily. 60 capsule 6  .  ROSUVASTATIN CALCIUM 20 MG PO TABS Oral Take 20 mg by mouth at bedtime.     . TAMOXIFEN CITRATE 20 MG PO TABS Oral Take 20 mg by mouth daily.       Bilateral Breast Cancer    BP 173/67  Pulse 86  Temp(Src) 98.9 F (37.2 C) (Oral)  Resp 16  SpO2 100%  Physical Exam  Nursing note and vitals reviewed. Constitutional: She is oriented to person, place, and time. She appears well-developed and well-nourished. No distress.  HENT:  Head: Normocephalic and atraumatic.  Eyes: EOM are normal.       Normal appearance  Neck: Normal range of motion.  Cardiovascular: Normal rate, regular rhythm, normal heart sounds and intact distal pulses.   Pulmonary/Chest: Effort normal.  Abdominal: Soft. There is no tenderness.  Musculoskeletal:       Right elbow: She exhibits decreased range of motion. She exhibits no swelling. tenderness found. Radial head, medial epicondyle, lateral epicondyle and olecranon process tenderness noted.       Right wrist: She exhibits tenderness, bony tenderness and swelling. She exhibits normal range of motion.       Right forearm: She exhibits no tenderness, no bony tenderness, no swelling and no edema.       Right hand: She exhibits decreased range of motion, tenderness, bony tenderness and swelling. Decreased sensation is not present in the ulnar distribution and is not present in the radial distribution. Decreased strength noted. She exhibits finger abduction, thumb/finger opposition and wrist extension trouble.  Neurological: She is alert and oriented to person, place, and time.  Skin: Skin is warm, dry and intact. There is erythema.       It has swelling, area erythema, and heat to the touch of her right upper extremity mainly her right wrist hand and digits.  Psychiatric: She has a normal mood and affect. Her behavior is normal.    ED Course  Procedures (including critical care time)   Labs Reviewed  CBC  DIFFERENTIAL  BASIC METABOLIC PANEL   No results  found.   No diagnosis found.  Pt move to CDU on Cellulitis protocol (clindamycin 600 started here), DVT r/o in am (right upper extremity)  MDM  Right arm pain  [place], [place] 10/12/11 [phone]

## 2011-10-12 NOTE — ED Notes (Signed)
Pt area on left hand skin marked with skin marker.

## 2011-10-12 NOTE — ED Provider Notes (Signed)
Medical screening examination/treatment/procedure(s) were conducted as a shared visit with non-physician practitioner(s) and myself.  I personally evaluated the patient during the encounter  Ethelda Chick, MD 10/12/11 2358

## 2011-10-12 NOTE — ED Notes (Signed)
IV team in ED told about need for IV start.

## 2011-10-12 NOTE — ED Provider Notes (Signed)
Medical screening examination/treatment/procedure(s) were conducted as a shared visit with non-physician practitioner(s) and myself.  I personally evaluated the patient during the encounter  Pt seen and evaluated.  Pt with appearance of cellulitis on right upper extremity.  Pt started on clindamycin and will obtain DVT study to r/o DVT as well.  Pt placed on CDU cellulitis protocol.  Ethelda Chick, MD 10/12/11 [phone]

## 2011-10-12 NOTE — ED Provider Notes (Signed)
11:49 PM Patient has arrived to CDU for cellulitis protocol.  This is a shared visit with Dr Karma Ganja.  Pt reports she is feeling well, pain is well controlled.  No needs at this time.  On exam, pt is A&Ox4, NAD, RRR, no m/r/g, CTAB, abd soft, NT, right arm with edema including hand and wrist, erythema marked on hand, pt is tender to palpation throughout arm.  Plan is for patient to get IV abx overnight and doppler US of RUE in the morning.  PCP is [facility].    [name], [place] 10/12/11 2351

## 2011-10-13 ENCOUNTER — Emergency Department (HOSPITAL_COMMUNITY): Payer: Medicare Other

## 2011-10-13 DIAGNOSIS — M7989 Other specified soft tissue disorders: Secondary | ICD-10-CM

## 2011-10-13 DIAGNOSIS — M79609 Pain in unspecified limb: Secondary | ICD-10-CM

## 2011-10-13 LAB — GLUCOSE, CAPILLARY: Glucose-Capillary: 141 mg/dL — ABNORMAL HIGH (ref 70–99)

## 2011-10-13 MED ORDER — OXYCODONE-ACETAMINOPHEN 5-325 MG PO TABS: 1.0000 | ORAL_TABLET | Freq: Once | ORAL | Status: DC

## 2011-10-13 MED ORDER — OXYCODONE-ACETAMINOPHEN 5-325 MG PO TABS
1.0000 | ORAL_TABLET | Freq: Once | ORAL | Status: AC
  Administered 2011-10-13: 1 via ORAL
  Filled 2011-10-13: qty 1

## 2011-10-13 MED ORDER — ONDANSETRON HCL 4 MG/2ML IJ SOLN
4.0000 mg | Freq: Once | INTRAMUSCULAR | Status: AC
  Administered 2011-10-13: 4 mg via INTRAVENOUS
  Filled 2011-10-13: qty 2

## 2011-10-13 MED ORDER — CLINDAMYCIN HCL 300 MG PO CAPS: ORAL_CAPSULE | ORAL | Status: DC

## 2011-10-13 MED ORDER — CLINDAMYCIN PHOSPHATE 900 MG/50ML IV SOLN
900.0000 mg | Freq: Once | INTRAVENOUS | Status: AC
  Administered 2011-10-13: 900 mg via INTRAVENOUS
  Filled 2011-10-13: qty 50

## 2011-10-13 NOTE — ED Notes (Signed)
Pt medicated for n/v after transport to ct. States the ride made her feel sick

## 2011-10-13 NOTE — Progress Notes (Signed)
Orthopedic Tech Progress Note Patient Details:  [name] 08/21/1924 [id number]  Type of Splint: Long arm;Other (comment) (arm foam sling) Splint Location: right arm Splint Interventions: Application    [name] 10/13/2011, 4:50 PM

## 2011-10-13 NOTE — Progress Notes (Signed)
Right upper extremity venous duplex completed.  Preliminary report is negative for DVT or SVT. Smiley Houseman 10/13/2011, 9:14 AM

## 2011-10-13 NOTE — ED Notes (Signed)
Called ct and spoke with megan. She advises it will be 30 min to an hour before pt will be scanned due to a scanner being down

## 2011-10-13 NOTE — ED Provider Notes (Signed)
7:19 AM Assumed care of patient in the CDU.  Patient reports that she continues to have pain in her right hand, right wrist, and right elbow.  Alert and orientated x 3.  VSS.  Lungs CTAB, Heart RRR, Abdomen soft and nondistended.  Right hand, wrist, and elbow tender to palpation and edematous.  Patient currently on Clindamycin IV.  Plan is to give patient more pain medication, continue IV Clindamycin, and u/s to rule out DVT this morning.  9:44 AM Reassessed patient.  Patient reports that her pain is well controlled at this time.  Awaiting the results of the UE doppler ultrasound. 11:28 AM Reassessed patient.  Patient reports that her pain is under control at this time.  She reports that the pain in her hand and wrist have resolved.   However, she continues to have pain of her right elbow.  Right elbow very tender to palpation.  Effusion seen on xray.  Xray report stated that an occult fracture of the elbow can not be excluded.  Therefore, a CT of the right elbow has been ordered.  Pascal Lux [facility] 10/15/11 2319

## 2011-10-13 NOTE — Progress Notes (Signed)
Patient care resumed from [place].  Results of the patient's CT was discussed with Dr. Oletta Lamas, the patient, and family members.  The patient has been educated that there is not a seen fracture however we will be treating her as if there was a fracture do to the significant osteopenia and degenerative bone disease found on CT.  It is recommended for the patient to followup with orthopedics Monday.  The patient verbalizes her understanding as does the patient's daughter.  The patient will be sent home with pain management and instructions to continue range of motion activities of right shoulder to avoid frozen shoulder syndrome.

## 2011-10-13 NOTE — ED Notes (Signed)
Ortho at bedside applying spliint

## 2011-10-13 NOTE — ED Notes (Signed)
Pt is unable to swallow pills. They need to be crushed. Pt is unable to move herself up or about in bed.

## 2011-10-14 ENCOUNTER — Telehealth (INDEPENDENT_AMBULATORY_CARE_PROVIDER_SITE_OTHER): Payer: Self-pay | Admitting: General Surgery

## 2011-10-14 NOTE — Progress Notes (Signed)
PAC discussed patient with me. Conservative treatment with arm placed in temporary splint with follow up with ortho suggested given non specific findings on CT scan.

## 2011-10-14 NOTE — Telephone Encounter (Signed)
Patient's daughter, Macon Large called. States her mother had bilateral mastectomy by Dr. Ezzard Standing one year ago. Now with 3 day history bilateral arm swelling and pain. Advised to take her to [facility] ED to evaluate for cellulitis vs. DVT vs. Lymphedema.  I could not tell what was going on from second hand description.

## 2011-10-15 IMAGING — CR DG CHEST 2V
2 series · 2 of 2 positions shown · non-contrast
Comparison: 07/12/2011 and earlier.

CLINICAL DATA: 87-year-old female with pain.

CHEST - 2 VIEW

[view not recorded (1 of 2)]
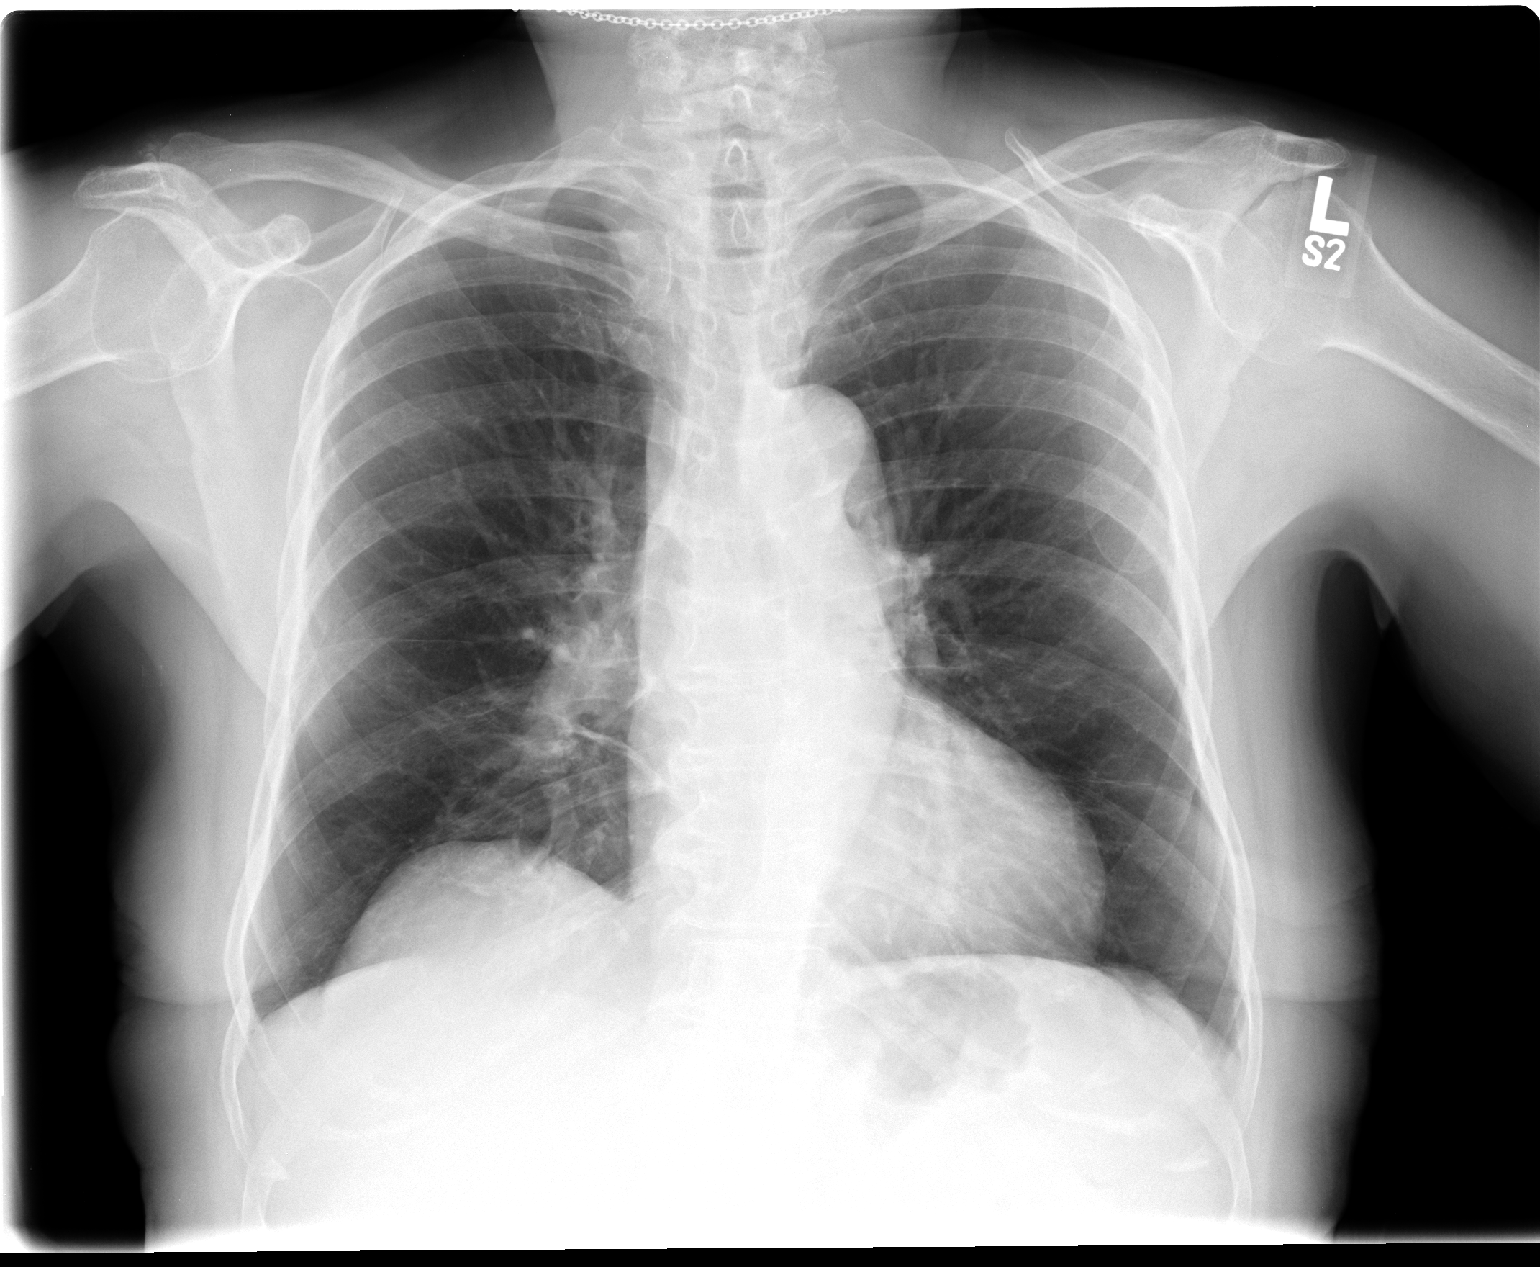

[view not recorded (2 of 2)]
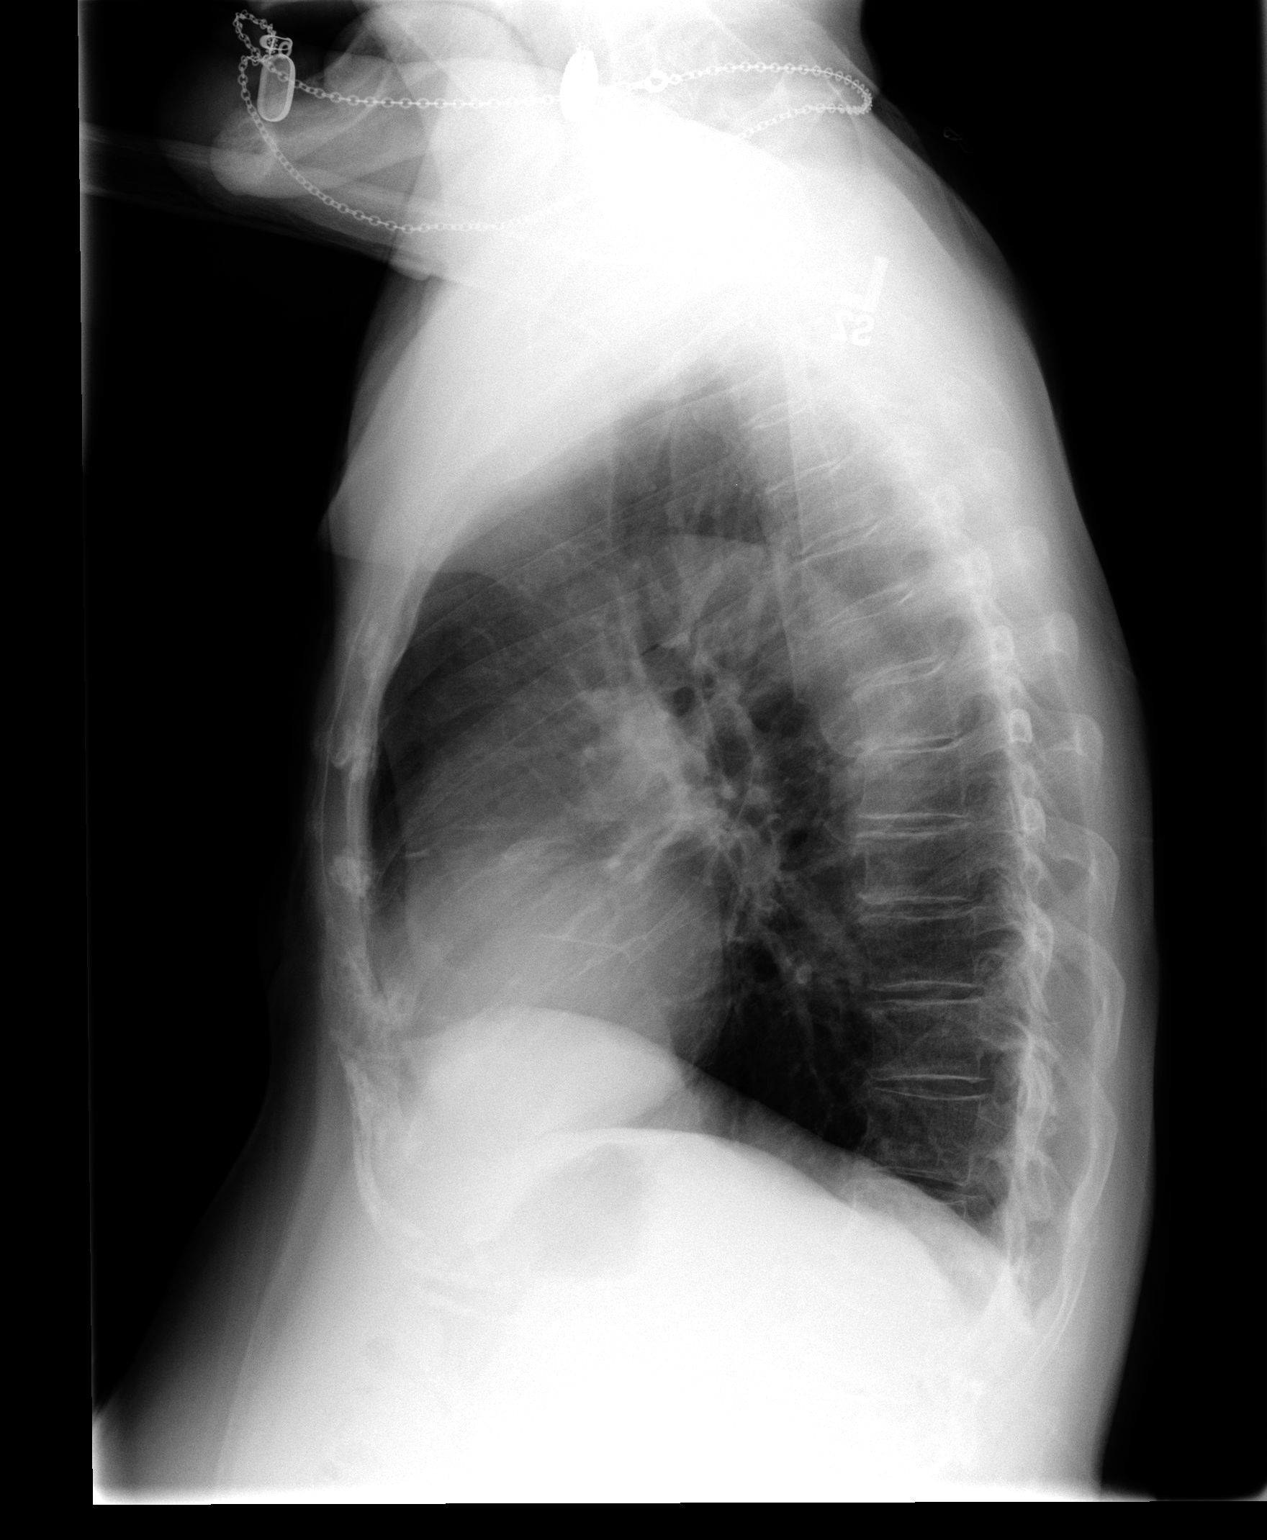

[2 of 2 positions shown; findings below may reference images not displayed]

FINDINGS: Stable eventration of the right hemidiaphragm.  Stable
lung volumes.  Cardiac size and mediastinal contours are within
normal limits.  Visualized tracheal air column is within normal
limits.  No pneumothorax or pleural effusion.  The lungs are clear.
There is an upper lumbar compression deformity which appears more
pronounced than on prior exam.  This is probably L1 or L2.
Otherwise stable visualized osseous structures.
IMPRESSION: 1. No acute cardiopulmonary abnormality.
2. Suspect acute on chronic upper lumbar compression fracture.
Lumbar MRI or nuclear medicine bone scan would best characterize
further if specific therapy such as vertebroplasty or kyphoplasty
is desired.

## 2011-10-16 NOTE — ED Provider Notes (Signed)
Medical screening examination/treatment/procedure(s) were performed by non-physician practitioner and as supervising physician I was immediately available for consultation/collaboration. Devoria Albe, MD, [facility]   Ward Givens, MD 10/16/11 [phone]

## 2011-10-17 ENCOUNTER — Ambulatory Visit (INDEPENDENT_AMBULATORY_CARE_PROVIDER_SITE_OTHER): Payer: Medicare Other | Admitting: Family Medicine

## 2011-10-17 ENCOUNTER — Encounter: Payer: Self-pay | Admitting: Family Medicine

## 2011-10-17 VITALS — BP 149/73 | HR 91 | Temp 98.6°F | Ht <= 58 in | Wt 125.0 lb

## 2011-10-17 DIAGNOSIS — L03113 Cellulitis of right upper limb: Secondary | ICD-10-CM | POA: Insufficient documentation

## 2011-10-17 DIAGNOSIS — IMO0002 Reserved for concepts with insufficient information to code with codable children: Secondary | ICD-10-CM

## 2011-10-17 MED ORDER — CLINDAMYCIN HCL 300 MG PO CAPS: 300.0000 mg | ORAL_CAPSULE | Freq: Four times a day (QID) | ORAL | Status: AC

## 2011-10-17 MED ORDER — OXYCODONE-ACETAMINOPHEN 5-325 MG PO TABS: 1.0000 | ORAL_TABLET | ORAL | Status: AC | PRN

## 2011-10-17 NOTE — Assessment & Plan Note (Signed)
Improved on oral clindamycin. Yet not resolve completely. I think a duration of 7 days may be a bit low for oral antibiotics in this case. We'll continue same dose of clindamycin for a total of 2 weeks. Additionally will prescribe oral pain medicine to help control some of her residual pain. Give red flag signs or symptoms for worsening cellulitis and Ms. [name] expresses understanding.

## 2011-10-17 NOTE — Patient Instructions (Signed)
Thank you for coming in today. Continue the clindamycin 4x a day.  Take it for a total of 2 weeks.  I will provide some pain pills too.  Come see me in January some time to get those shoulders taken care of.  If that swelling gets worse or you get a high fever or chills let me know.

## 2011-10-17 NOTE — Progress Notes (Signed)
Here to follow a right arm cellulitis. Developed hand and right arm pain last Saturday (9 days ago) went to the emergency room on 11/29 was diagnosed with cellulitis and started on oral clindamycin. In the interim her pain has lessened and the swelling also has reduced. She denies any current fevers or chills nausea vomiting feels generally well. She notes that her arm continues to be tender but feels better.  PMH reviewed.  ROS as above otherwise neg Medications reviewed. Current Outpatient Prescriptions  Medication Sig Dispense Refill  . acetaminophen (TYLENOL) 500 MG tablet Take 1,000 mg by mouth every 6 (six) hours as needed. For pain       . albuterol (PROVENTIL HFA;VENTOLIN HFA) 108 (90 BASE) MCG/ACT inhaler Inhale 2 puffs into the lungs every 6 (six) hours as needed. For shortness of breath       . amLODipine (NORVASC) 10 MG tablet Take 10 mg by mouth daily.       Marland Kitchen aspirin (ASPIRIN CHILDRENS) 81 MG chewable tablet Chew 81 mg by mouth daily.        . carvedilol (COREG) 12.5 MG tablet Take 12.5 mg by mouth 2 (two) times daily.        . clindamycin (CLEOCIN) 300 MG capsule Clindamycin 300mg  PO QID x 7 days  28 capsule  0  . clindamycin (CLEOCIN) 300 MG capsule Take 1 capsule (300 mg total) by mouth 4 (four) times daily.  28 capsule  0  . clopidogrel (PLAVIX) 75 MG tablet Take 75 mg by mouth daily.        Marland Kitchen docusate sodium (COLACE) 100 MG capsule Take 100 mg by mouth 2 (two) times daily. For stool softner       . fluticasone (FLONASE) 50 MCG/ACT nasal spray Place 2 sprays into the nose daily as needed. For nasal congestion      . Fluticasone Propionate, Inhal, (FLOVENT DISKUS) 100 MCG/BLIST AEPB Inhale 1 puff into the lungs 2 (two) times daily.  60 each  6  . glipiZIDE (GLUCOTROL) 5 MG tablet Take 1 tablet (5 mg total) by mouth 2 (two) times daily before a meal.  60 tablet  6  . hydrochlorothiazide (HYDRODIURIL) 25 MG tablet Take 25 mg by mouth daily.        Marland Kitchen HYDROcodone-acetaminophen  (NORCO) 5-325 MG per tablet Take 0.5 tablets by mouth every 4 (four) hours as needed. For pain       . metFORMIN (GLUCOPHAGE) 500 MG tablet Take 1 tablet (500 mg total) by mouth 2 (two) times daily with a meal.  60 tablet  11  . oxyCODONE-acetaminophen (ROXICET) 5-325 MG per tablet Take 1 tablet by mouth every 4 (four) hours as needed for pain.  40 tablet  0  . PATADAY 0.2 % SOLN Place 1 drop into both eyes daily as needed. For itchy eyes      . polyethylene glycol (MIRALAX) powder Take 17 g by mouth daily as needed. For constipation. Mix in 8 oz glass of water.      . ranitidine (ZANTAC) 150 MG capsule Take 1 capsule (150 mg total) by mouth 2 (two) times daily.  60 capsule  6  . rosuvastatin (CRESTOR) 20 MG tablet Take 20 mg by mouth at bedtime.       . tamoxifen (NOLVADEX) 20 MG tablet Take 20 mg by mouth daily.         Exam:  BP 149/73  Pulse 91  Temp(Src) 98.6 F (37 C) (Oral)  Ht 4'  10" (1.473 m)  Wt 125 lb (56.7 kg)  BMI 26.13 kg/m2 Gen: Well NAD Skin: Some erythema and swelling of the right hand about the second MCP joint. Lying drawn on her hand to demarcate and initial area of induration.  No erythema on her arm or outside of the original line. Tender to palpation on her right hand but less tender on her arm. Fingers are neurovascularly intact.

## 2011-10-18 ENCOUNTER — Emergency Department (HOSPITAL_COMMUNITY)
Admission: EM | Admit: 2011-10-18 | Discharge: 2011-10-18 | Disposition: A | Discharge status: Home / Self Care | Payer: Medicare Other | Attending: Emergency Medicine | Admitting: Emergency Medicine

## 2011-10-18 ENCOUNTER — Emergency Department (HOSPITAL_COMMUNITY): Payer: Medicare Other

## 2011-10-18 ENCOUNTER — Other Ambulatory Visit: Payer: Self-pay

## 2011-10-18 ENCOUNTER — Encounter (HOSPITAL_COMMUNITY): Payer: Self-pay | Admitting: *Deleted

## 2011-10-18 DIAGNOSIS — I509 Heart failure, unspecified: Secondary | ICD-10-CM | POA: Insufficient documentation

## 2011-10-18 DIAGNOSIS — L02519 Cutaneous abscess of unspecified hand: Secondary | ICD-10-CM | POA: Insufficient documentation

## 2011-10-18 DIAGNOSIS — R609 Edema, unspecified: Secondary | ICD-10-CM | POA: Insufficient documentation

## 2011-10-18 DIAGNOSIS — I10 Essential (primary) hypertension: Secondary | ICD-10-CM | POA: Insufficient documentation

## 2011-10-18 DIAGNOSIS — Z79899 Other long term (current) drug therapy: Secondary | ICD-10-CM | POA: Insufficient documentation

## 2011-10-18 DIAGNOSIS — L0390 Cellulitis, unspecified: Secondary | ICD-10-CM

## 2011-10-18 DIAGNOSIS — Z853 Personal history of malignant neoplasm of breast: Secondary | ICD-10-CM | POA: Insufficient documentation

## 2011-10-18 DIAGNOSIS — R0602 Shortness of breath: Secondary | ICD-10-CM | POA: Insufficient documentation

## 2011-10-18 DIAGNOSIS — J45909 Unspecified asthma, uncomplicated: Secondary | ICD-10-CM | POA: Insufficient documentation

## 2011-10-18 DIAGNOSIS — M7989 Other specified soft tissue disorders: Secondary | ICD-10-CM

## 2011-10-18 DIAGNOSIS — Z85038 Personal history of other malignant neoplasm of large intestine: Secondary | ICD-10-CM | POA: Insufficient documentation

## 2011-10-18 DIAGNOSIS — R42 Dizziness and giddiness: Secondary | ICD-10-CM | POA: Insufficient documentation

## 2011-10-18 DIAGNOSIS — K219 Gastro-esophageal reflux disease without esophagitis: Secondary | ICD-10-CM | POA: Insufficient documentation

## 2011-10-18 DIAGNOSIS — M79609 Pain in unspecified limb: Secondary | ICD-10-CM | POA: Insufficient documentation

## 2011-10-18 DIAGNOSIS — Z7982 Long term (current) use of aspirin: Secondary | ICD-10-CM | POA: Insufficient documentation

## 2011-10-18 DIAGNOSIS — E785 Hyperlipidemia, unspecified: Secondary | ICD-10-CM | POA: Insufficient documentation

## 2011-10-18 DIAGNOSIS — E119 Type 2 diabetes mellitus without complications: Secondary | ICD-10-CM | POA: Insufficient documentation

## 2011-10-18 DIAGNOSIS — M129 Arthropathy, unspecified: Secondary | ICD-10-CM | POA: Insufficient documentation

## 2011-10-18 DIAGNOSIS — I2510 Atherosclerotic heart disease of native coronary artery without angina pectoris: Secondary | ICD-10-CM | POA: Insufficient documentation

## 2011-10-18 LAB — BASIC METABOLIC PANEL
Calcium: 10.3 mg/dL (ref 8.4–10.5)
Chloride: 101 mEq/L (ref 96–112)
Creatinine, Ser: 0.84 mg/dL (ref 0.50–1.10)
GFR calc Af Amer: 71 mL/min — ABNORMAL LOW (ref >90)

## 2011-10-18 LAB — DIFFERENTIAL
Basophils Absolute: 0 10*3/uL (ref 0.0–0.1)
Lymphs Abs: 0.9 10*3/uL (ref 0.7–4.0)
Monocytes Relative: 5 % (ref 3–12)

## 2011-10-18 LAB — URINALYSIS, ROUTINE W REFLEX MICROSCOPIC
Bilirubin Urine: NEGATIVE
Hgb urine dipstick: NEGATIVE
Protein, ur: NEGATIVE mg/dL
Urobilinogen, UA: 0.2 mg/dL (ref 0.0–1.0)

## 2011-10-18 LAB — CBC
MCV: 69.3 fL — ABNORMAL LOW (ref 78.0–100.0)
Platelets: 230 10*3/uL (ref 150–400)
RDW: 14.6 % (ref 11.5–15.5)
WBC: 5.8 10*3/uL (ref 4.0–10.5)

## 2011-10-18 LAB — TROPONIN I: Troponin I: <0.3 ng/mL (ref <0.30)

## 2011-10-18 MED ORDER — SODIUM CHLORIDE 0.9 % IV BOLUS (SEPSIS): 1000.0000 mL | Freq: Once | INTRAVENOUS | Status: DC

## 2011-10-18 MED ORDER — FENTANYL CITRATE 0.05 MG/ML IJ SOLN: 50.0000 ug | Freq: Once | INTRAMUSCULAR | Status: DC

## 2011-10-18 MED ORDER — SODIUM CHLORIDE 0.9 % IV BOLUS (SEPSIS): 500.0000 mL | Freq: Once | INTRAVENOUS | Status: DC

## 2011-10-18 NOTE — ED Notes (Signed)
C/o contibnued pain, "it's not going away". Noted edema, redness, warm to touch. States pain, swelling was all the way up to elbow but is now mostly to hand.  "My hand has a fever". Limited mvmt

## 2011-10-18 NOTE — ED Notes (Signed)
Saw patient earlier Was going to do ECG  And also orthostatic vital signs.  Patient need to be taken to vascular lab for study.

## 2011-10-18 NOTE — ED Notes (Addendum)
From home, pt c/o right hand edema, pain, redness x 1 week. Seen here for same 10-12-11. Taking clindamycin presently. Reports not getting better

## 2011-10-18 NOTE — ED Notes (Signed)
Pt moved to CDU. This rn didn't receive report from outgoing RN. CDU nurse aware of transfer.

## 2011-10-18 NOTE — ED Provider Notes (Signed)
See prior note   Ward Givens, MD 10/18/11 1910

## 2011-10-18 NOTE — ED Notes (Signed)
Could not do orthostatic standing vital signs.  Patient stated she is too weak to try to stand.  Notified RN

## 2011-10-18 NOTE — ED Notes (Signed)
Pt returned from vascular lab.

## 2011-10-18 NOTE — ED Notes (Signed)
Pt taken to vascular lab for testing with NT Perdue. IV team placed IV access.

## 2011-10-18 NOTE — ED Provider Notes (Signed)
History     CSN: [id number] Arrival date & time: 10/18/2011 10:08 AM   First MD Initiated Contact with Patient 10/18/11 1009      Chief Complaint  Patient presents with  . Extremity Pain  . Wound Infection   HPI Patient presents to the emergency room with complaint of right hand pain, that is warm and painful. Patient was recently diagnosed with cellulitis of the hand. Patient also complains of shortness of breath for the past week. Patient reports that she has been taking clindamycin for the cellulitis infection as directed, and that the swelling of hand. Patient reports some dizziness, not vertigo with going from sitting to standing. Patient denies any weakness or numbness. Patient denies any exertional chest pain. Patient denies any other complaints. Reports that she has not been drinking very much lately. Reports that her pain has resolved.    Past Medical History  Diagnosis Date  . Breast cancer 10/2010    Invasive Ductal Carcinoma, s/p bilateral mastectomy, now on Tamoxifen. Followed by Dr Dalene Carrow.   . Diabetes mellitus   . Hypertension   . Hyperlipidemia   . Anemia     Iron def anemia  . CAD (coronary artery disease)   . CHF (congestive heart failure)     Systolic   . History of colon cancer   . Neuropathy   . Allergy   . GERD (gastroesophageal reflux disease)   . Hiatal hernia   . Diverticulosis   . Asthma   . Arthritis   . H/O: GI bleed   . Breast cancer 2011    s/p Bilateral masectomy    Past Surgical History  Procedure Date  . Breast masectomy 10/2010    Bilateral masectomy by Dr Ezzard Standing s/p invasive ductal carcinoma.  . Cardiac  catherization 2007    Severe 3-vessel disease.  EF 20-25%.  . Esophagogastroduodenoscopy 2001    Esophageal Tear  . Total knee arthroplasty 2001    Family History  Problem Relation Age of Onset  . Sickle cell anemia Other   . Heart disease Mother   . Heart disease Father     History  Substance Use Topics  . Smoking  status: Never Smoker   . Smokeless tobacco: Current User    Types: Snuff  . Alcohol Use: No    OB History    Grav Para Term Preterm Abortions TAB SAB Ect Mult Living                  Review of Systems  Constitutional: Negative for fever, chills, diaphoresis and appetite change.  HENT: Negative for neck pain.   Eyes: Negative for photophobia and visual disturbance.  Respiratory: Positive for shortness of breath. Negative for cough and chest tightness.   Cardiovascular: Negative for chest pain and leg swelling.  Gastrointestinal: Negative for nausea, vomiting and abdominal pain.  Genitourinary: Negative for flank pain.  Musculoskeletal: Negative for back pain.  Skin: Negative for rash.  Neurological: Positive for dizziness. Negative for numbness.  All other systems reviewed and are negative.    Allergies  Lisinopril and Peanuts  Home Medications   Current Outpatient Rx  Name Route Sig Dispense Refill  . ACETAMINOPHEN 500 MG PO TABS Oral Take 1,000 mg by mouth every 6 (six) hours as needed. For pain     . ALBUTEROL SULFATE HFA 108 (90 BASE) MCG/ACT IN AERS Inhalation Inhale 2 puffs into the lungs every 6 (six) hours as needed. For shortness of breath     .  AMLODIPINE BESYLATE 10 MG PO TABS Oral Take 10 mg by mouth daily.     . ASPIRIN 81 MG PO CHEW Oral Chew 81 mg by mouth daily.      Marland Kitchen CARVEDILOL 12.5 MG PO TABS Oral Take 12.5 mg by mouth 2 (two) times daily.      Marland Kitchen CLINDAMYCIN HCL 300 MG PO CAPS  Clindamycin 300mg  PO QID x 7 days 28 capsule 0  . CLINDAMYCIN HCL 300 MG PO CAPS Oral Take 1 capsule (300 mg total) by mouth 4 (four) times daily. 28 capsule 0  . CLOPIDOGREL BISULFATE 75 MG PO TABS Oral Take 75 mg by mouth daily.      Marland Kitchen DOCUSATE SODIUM 100 MG PO CAPS Oral Take 100 mg by mouth 2 (two) times daily. For stool softner     . FLUTICASONE PROPIONATE 50 MCG/ACT NA SUSP Nasal Place 2 sprays into the nose daily as needed. For nasal congestion    . FLUTICASONE PROPIONATE  (INHAL) 100 MCG/BLIST IN AEPB Inhalation Inhale 1 puff into the lungs 2 (two) times daily. 60 each 6  . GLIPIZIDE 5 MG PO TABS Oral Take 1 tablet (5 mg total) by mouth 2 (two) times daily before a meal. 60 tablet 6  . HYDROCHLOROTHIAZIDE 25 MG PO TABS Oral Take 25 mg by mouth daily.      Marland Kitchen HYDROCODONE-ACETAMINOPHEN 5-325 MG PO TABS Oral Take 0.5 tablets by mouth every 4 (four) hours as needed. For pain     . METFORMIN HCL 500 MG PO TABS Oral Take 1 tablet (500 mg total) by mouth 2 (two) times daily with a meal. 60 tablet 11  . OXYCODONE-ACETAMINOPHEN 5-325 MG PO TABS Oral Take 1 tablet by mouth every 4 (four) hours as needed for pain. 40 tablet 0  . PATADAY 0.2 % OP SOLN Both Eyes Place 1 drop into both eyes daily as needed. For itchy eyes    . POLYETHYLENE GLYCOL 3350 PO POWD Oral Take 17 g by mouth daily as needed. For constipation. Mix in 8 oz glass of water.    Marland Kitchen RANITIDINE HCL 150 MG PO CAPS Oral Take 1 capsule (150 mg total) by mouth 2 (two) times daily. 60 capsule 6  . ROSUVASTATIN CALCIUM 20 MG PO TABS Oral Take 20 mg by mouth at bedtime.     . TAMOXIFEN CITRATE 20 MG PO TABS Oral Take 20 mg by mouth daily.       Bilateral Breast Cancer    Pulse 77  Temp(Src) 99.3 F (37.4 C) (Oral)  Resp 16  SpO2 99%  Physical Exam  Nursing note and vitals reviewed. Constitutional: She is oriented to person, place, and time. No distress.  HENT:  Head: Normocephalic and atraumatic.  Eyes: EOM are normal. Pupils are equal, round, and reactive to light.  Neck: Normal range of motion. Neck supple.  Cardiovascular: Normal rate and regular rhythm.  Exam reveals no gallop and no friction rub.   No murmur heard. Pulmonary/Chest: Effort normal and breath sounds normal. No stridor. No respiratory distress. She has no wheezes. She has no rales. She exhibits no tenderness.  Abdominal: Soft. Bowel sounds are normal. She exhibits no distension and no mass. There is no tenderness. There is no rebound and no  guarding.  Musculoskeletal: Normal range of motion. She exhibits edema and tenderness.       Patient has swelling noted to the right forearm and hand.radial pulse intact. Full sensation. No focal signs of infection. Warmth noted to  the dorsal surface of hand.   Neurological: She is alert and oriented to person, place, and time. No cranial nerve deficit. Coordination normal.  Skin: Skin is warm and dry. No rash noted. She is not diaphoretic. No erythema. No pallor.  Psychiatric: She has a normal mood and affect. Her behavior is normal. Judgment and thought content normal.    ED Course  Procedures (including critical care time)  Patient seen and evaluated.  VSS reviewed. . Nursing notes reviewed. Discussed with and seen with dr. Lynelle Doctor. Discussed care plan together. Patient symptoms consistent with improving cellulitis of the right hand. Pain has improved. Initial testing ordered. Will monitor the patient closely. They agree with the treatment plan and diagnosis.   Results for orders placed during the hospital encounter of 10/18/11  CBC      Component Value Range   WBC 5.8  4.0 - 10.5 (K/uL)   RBC 4.63  3.87 - 5.11 (MIL/uL)   Hemoglobin 10.3 (*) 12.0 - 15.0 (g/dL)   HCT 16.1 (*) 09.6 - 46.0 (%)   MCV 69.3 (*) 78.0 - 100.0 (fL)   MCH 22.2 (*) 26.0 - 34.0 (pg)   MCHC 32.1  30.0 - 36.0 (g/dL)   RDW 04.5  40.9 - 81.1 (%)   Platelets 230  150 - 400 (K/uL)  DIFFERENTIAL      Component Value Range   Neutrophils Relative 78 (*) 43 - 77 (%)   Lymphocytes Relative 16  12 - 46 (%)   Monocytes Relative 5  3 - 12 (%)   Eosinophils Relative 1  0 - 5 (%)   Basophils Relative 0  0 - 1 (%)   Neutro Abs 4.5  1.7 - 7.7 (K/uL)   Lymphs Abs 0.9  0.7 - 4.0 (K/uL)   Monocytes Absolute 0.3  0.1 - 1.0 (K/uL)   Eosinophils Absolute 0.1  0.0 - 0.7 (K/uL)   Basophils Absolute 0.0  0.0 - 0.1 (K/uL)   RBC Morphology ELLIPTOCYTES    BASIC METABOLIC PANEL      Component Value Range   Sodium 134 (*) 135 - 145  (mEq/L)   Potassium 3.7  3.5 - 5.1 (mEq/L)   Chloride 101  96 - 112 (mEq/L)   CO2 24  19 - 32 (mEq/L)   Glucose, Bld 166 (*) 70 - 99 (mg/dL)   BUN 9  6 - 23 (mg/dL)   Creatinine, Ser 9.14  0.50 - 1.10 (mg/dL)   Calcium 78.2  8.4 - 10.5 (mg/dL)   GFR calc non Af Amer 62 (*) >90 (mL/min)   GFR calc Af Amer 71 (*) >90 (mL/min)  TROPONIN I      Component Value Range   Troponin I <0.30  <0.30 (ng/mL)   Dg Chest 2 View  10/18/2011  *RADIOLOGY REPORT*  Clinical Data: Shortness of breath.  CHEST - 2 VIEW  Comparison: 08/24/2011  Findings: Lateral view degraded by patient arm position.  Midline trachea.  Mild cardiomegaly with atherosclerosis in the transverse aorta.  No definite pleural fluid. No congestive failure.  The right hilum appears prominent.  This is favored to be projectional.  Mild scarring at the left lung base.  IMPRESSION: Mild cardiomegaly, without acute disease.  Apparent right hilar soft tissue fullness is favored to be projectional.  If there is concern of adenopathy, consider CT.  Original Report Authenticated By: Consuello Bossier, M.D.    Date: 10/18/2011, 1431  Rate: 80  Rhythm: normal sinus rhythm  QRS Axis: left  Intervals: normal  ST/T Wave abnormalities: nonspecific ST/T changes  Conduction Disutrbances:none  Narrative Interpretation:   Old EKG Reviewed: unchanged, 05/26/11  Patient seen and re-evaluated. Resting comfortably. VSS stable. NAD. Patient notified of testing results. Stated agreement and understanding. Patient stated understanding to treatment plan and diagnosis. Discussed with Dr. Lynelle Doctor. Will move to cdu for holding, ultrasound of upper extremity, UA, pain medications, fluids, and three hour troponin. Will ambulate once labs are back.   3:07 PM discussed with Florentina Addison, PA-C who will assume care. Three hour cardiac marker, and UA.   MDM  Shortness of breath Hand pain/infection    Demetrius Charity, PA 10/18/11 1515

## 2011-10-18 NOTE — ED Notes (Signed)
Went out to Patient to do orthostatic vital signs.  Patient was taken to Xray.

## 2011-10-18 NOTE — ED Provider Notes (Signed)
Pt in ED for hand pain. Pt had been diagnosed with cellulitis, however, their was concern for possible DVT in hand. Therefore, doppler was done. Doppler report:  Right upper extremity venous duplex completed. Preliminary report is negative for DVT or SVT.  Smiley Houseman  10/18/2011, 12:19 PM  Pts images negative for DVT. Awaiting 3 hour Troponin, if negative pt can continue to be treated for  Cellulitis and D/C'd home with strict follow-up instructions.   Discussed results with patient and daughter. Daughter is concerned about the patients dizziness and headaches that began same time as wrist pain. THe patient normally keeps a UTI, the daughter said but the urine has not been collected yet. Will obtain urine and check for UTI, this may be the cause of the patients dizziness.  I spoke with patients PCP Dr. Denyse Amass who is in the hospital today. He agreed to see patient in the ED and talk to her about coming to the office to see him for the primary care issues that she is having.  Dorthula Matas, PA 10/18/11 1850

## 2011-10-18 NOTE — Progress Notes (Signed)
Right upper extremity venous duplex completed.  Preliminary report is negative for DVT or SVT. Smiley Houseman 10/18/2011, 12:19 PM

## 2011-10-18 NOTE — ED Provider Notes (Signed)
Recently in the emergency department with cellulitis protocol. She will reports continued pain especially in her right hand. She still has some mild diffuse swelling of her arm. She has been treated with clindamycin IV and then orally. She also reports last night she had some shortness of breath without chest pain. She states she had similar when she had a MI in 2003. Patient does have diffuse swelling of her right hand with some mild redness. She still has some teeth marks where her redness was marked from her prior ER visit.  Medical screening examination/treatment/procedure(s) were conducted as a shared visit with non-physician practitioner(s) and myself.  I personally evaluated the patient during the encounter Devoria Albe, MD, Franz Dell, MD 10/18/11 [phone]

## 2011-10-19 IMAGING — CR DG HIP COMPLETE 2+V*R*
3 series · 3 of 3 positions shown · non-contrast
Comparison: 12/26/2009

CLINICAL DATA: Pain.  No known injury.

RIGHT HIP - COMPLETE 2+ VIEW

[t pelvis ap]
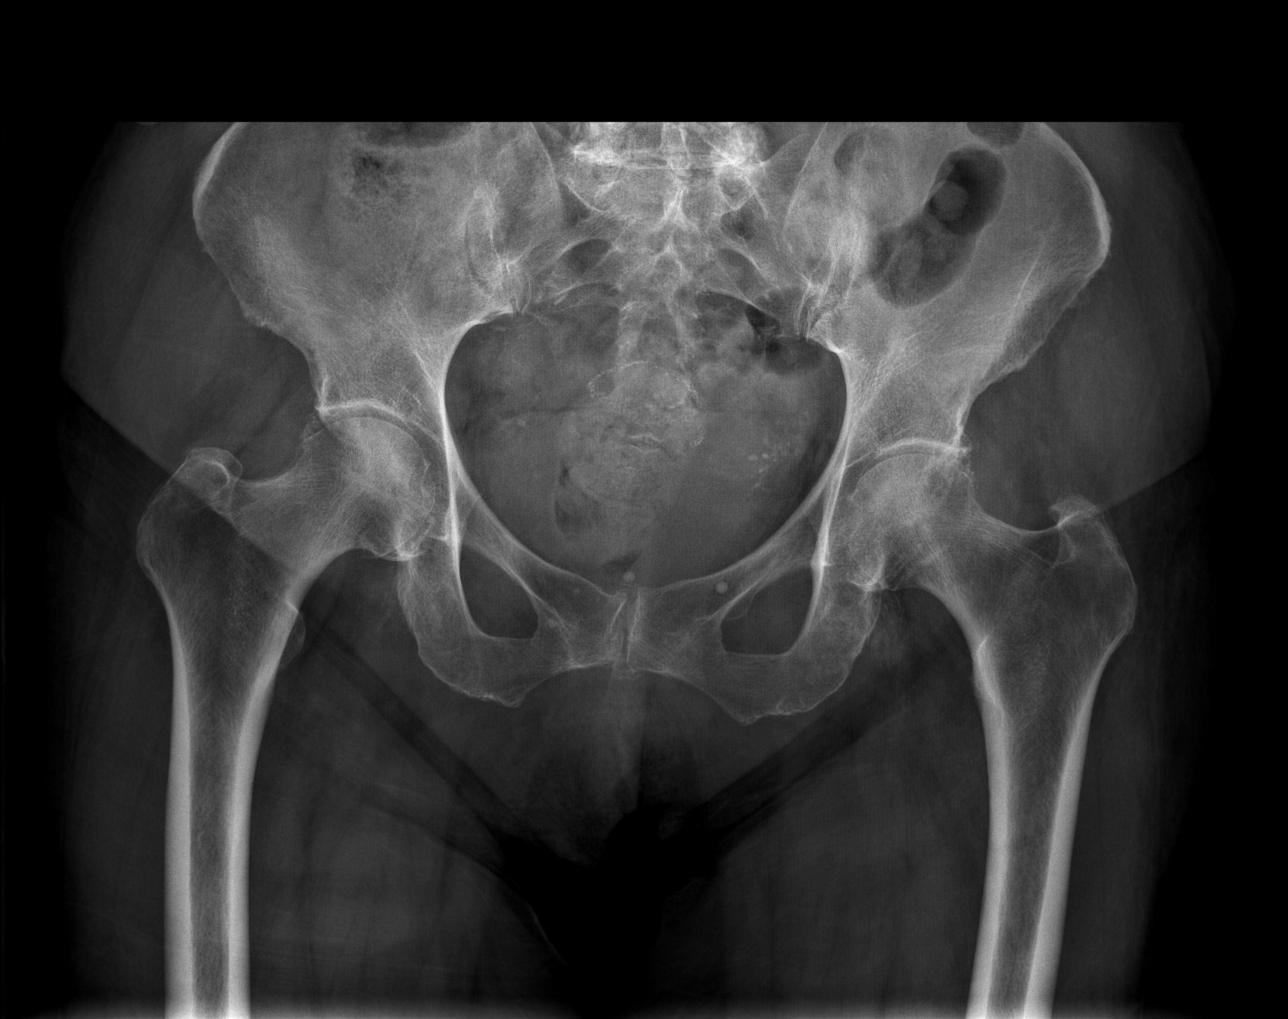

[t hip ap right]
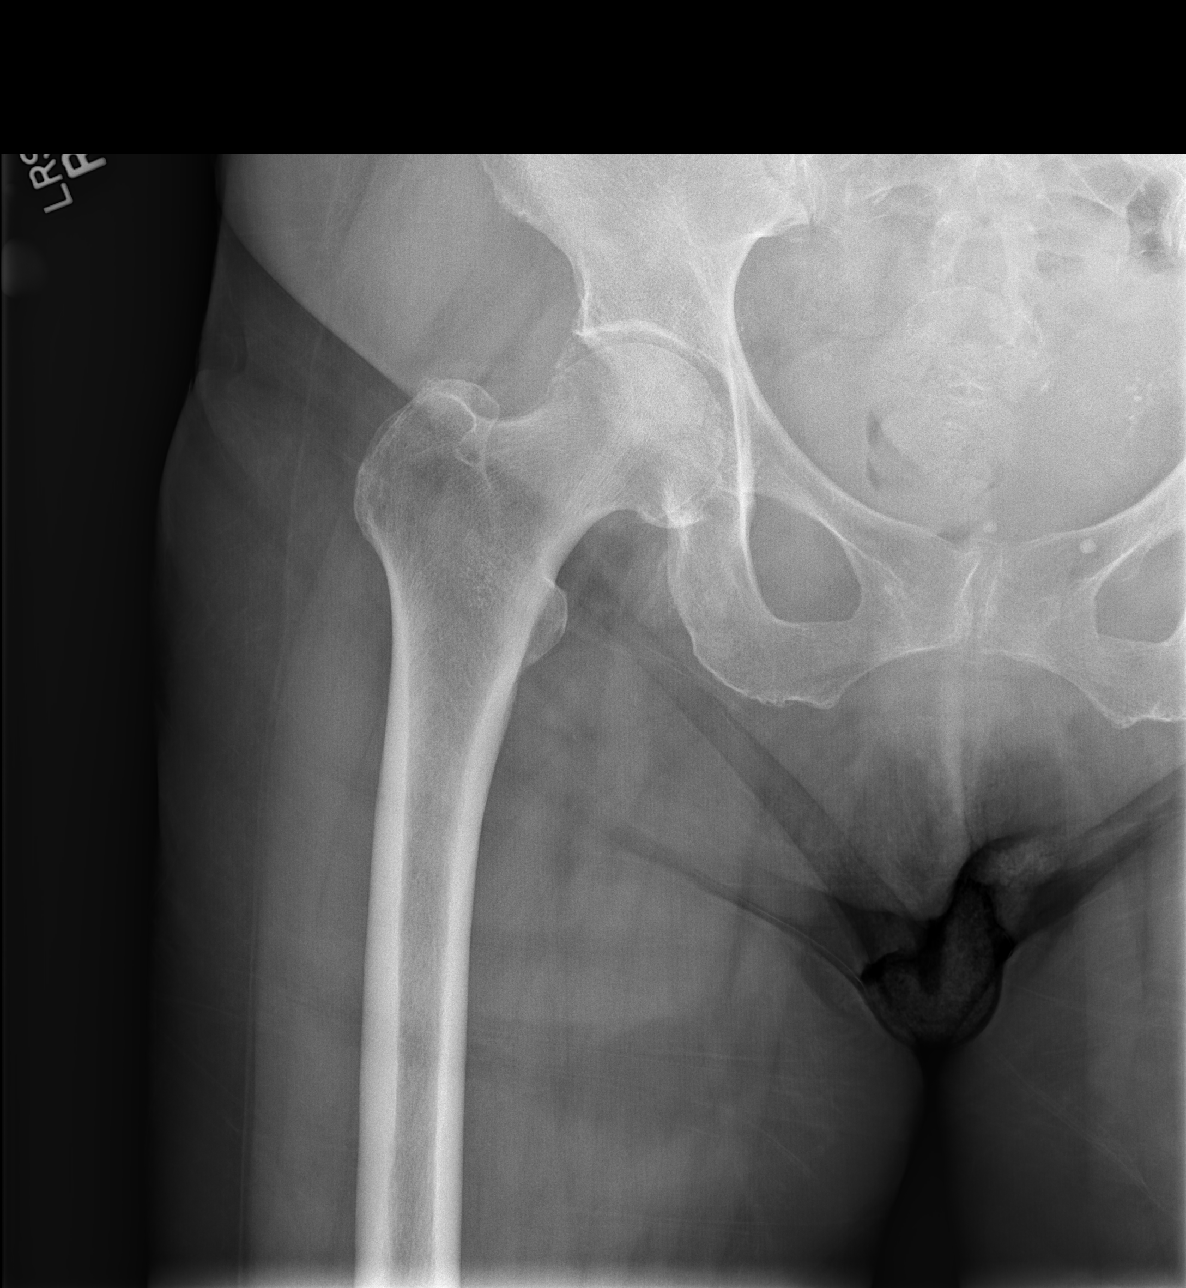

[t hip frog leg right]
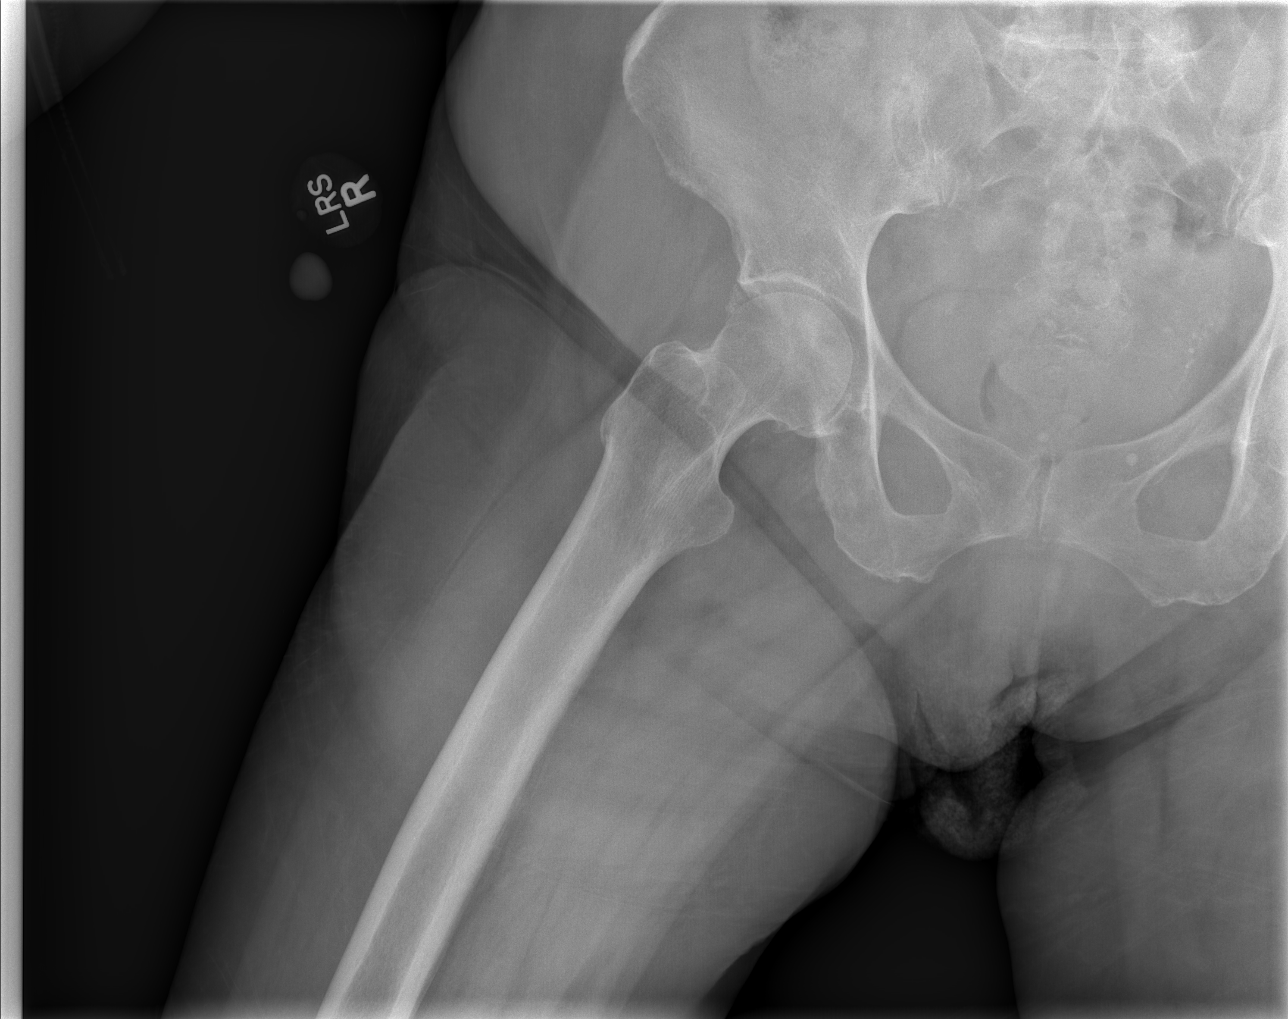

[3 of 3 positions shown; findings below may reference images not displayed]

FINDINGS: Degenerative changes in the lower lumbar spine and hips.
The pelvis, SI joints, and symphysis pubis appear intact.  The
right hip appears intact.  No evidence of acute fracture or
subluxation.  Postoperative changes and phleboliths in the pelvis.
IMPRESSION: Degenerative changes in the right hip.  No acute fractures or
subluxations demonstrated.

## 2011-10-19 NOTE — ED Provider Notes (Signed)
Evaluation and management procedures were performed by the PA/NP under my supervision/collaboration.   Dione Booze, MD 10/19/11 1038

## 2011-10-20 NOTE — Progress Notes (Signed)
Observation review for 11/29 is complete.

## 2011-10-22 ENCOUNTER — Other Ambulatory Visit: Payer: Self-pay | Admitting: Family Medicine

## 2011-10-23 NOTE — Telephone Encounter (Signed)
Refill request

## 2011-10-25 ENCOUNTER — Telehealth: Payer: Self-pay | Admitting: Hematology and Oncology

## 2011-10-25 NOTE — Telephone Encounter (Signed)
S/w pt, gave appt 12/12/11 @ 2pm.

## 2011-11-02 ENCOUNTER — Telehealth (INDEPENDENT_AMBULATORY_CARE_PROVIDER_SITE_OTHER): Payer: Self-pay

## 2011-11-02 ENCOUNTER — Encounter (HOSPITAL_COMMUNITY): Payer: Self-pay | Admitting: *Deleted

## 2011-11-02 ENCOUNTER — Emergency Department (HOSPITAL_COMMUNITY)
Admission: EM | Admit: 2011-11-02 | Discharge: 2011-11-02 | Disposition: A | Discharge status: Home / Self Care | Payer: Medicare Other | Attending: Emergency Medicine | Admitting: Emergency Medicine

## 2011-11-02 DIAGNOSIS — Z96659 Presence of unspecified artificial knee joint: Secondary | ICD-10-CM | POA: Insufficient documentation

## 2011-11-02 DIAGNOSIS — C50919 Malignant neoplasm of unspecified site of unspecified female breast: Secondary | ICD-10-CM | POA: Insufficient documentation

## 2011-11-02 DIAGNOSIS — M109 Gout, unspecified: Secondary | ICD-10-CM | POA: Insufficient documentation

## 2011-11-02 DIAGNOSIS — I5020 Unspecified systolic (congestive) heart failure: Secondary | ICD-10-CM | POA: Insufficient documentation

## 2011-11-02 DIAGNOSIS — I2510 Atherosclerotic heart disease of native coronary artery without angina pectoris: Secondary | ICD-10-CM | POA: Insufficient documentation

## 2011-11-02 DIAGNOSIS — E1142 Type 2 diabetes mellitus with diabetic polyneuropathy: Secondary | ICD-10-CM | POA: Insufficient documentation

## 2011-11-02 DIAGNOSIS — M79609 Pain in unspecified limb: Secondary | ICD-10-CM | POA: Insufficient documentation

## 2011-11-02 DIAGNOSIS — E1149 Type 2 diabetes mellitus with other diabetic neurological complication: Secondary | ICD-10-CM | POA: Insufficient documentation

## 2011-11-02 DIAGNOSIS — D509 Iron deficiency anemia, unspecified: Secondary | ICD-10-CM | POA: Insufficient documentation

## 2011-11-02 DIAGNOSIS — I509 Heart failure, unspecified: Secondary | ICD-10-CM | POA: Insufficient documentation

## 2011-11-02 DIAGNOSIS — E785 Hyperlipidemia, unspecified: Secondary | ICD-10-CM | POA: Insufficient documentation

## 2011-11-02 DIAGNOSIS — I10 Essential (primary) hypertension: Secondary | ICD-10-CM | POA: Insufficient documentation

## 2011-11-02 MED ORDER — OXYCODONE-ACETAMINOPHEN 5-325 MG PO TABS
1.0000 | ORAL_TABLET | Freq: Four times a day (QID) | ORAL | Status: AC | PRN
Start: 2011-11-02 — End: 2011-11-12

## 2011-11-02 MED ORDER — OXYCODONE-ACETAMINOPHEN 5-325 MG PO TABS
1.0000 | ORAL_TABLET | Freq: Once | ORAL | Status: AC
  Administered 2011-11-02: 1 via ORAL
  Filled 2011-11-02: qty 1

## 2011-11-02 MED ORDER — AMOXICILLIN-POT CLAVULANATE 875-125 MG PO TABS: 1.0000 | ORAL_TABLET | Freq: Two times a day (BID) | ORAL | Status: AC

## 2011-11-02 NOTE — Discharge Instructions (Signed)
Take pain medicine as directed. Trial of antibiotic in case infection. Suspect gout. Followup with [facility] in the next few days for recheck. Return for new or worse symptoms.  Gout Gout is an inflammatory condition (arthritis) caused by a buildup of uric acid crystals in the joints. Uric acid is a chemical that is normally present in the blood. Under some circumstances, uric acid can form into crystals in your joints. This causes joint redness, soreness, and swelling (inflammation). Repeat attacks are common. Over time, uric acid crystals can form into masses (tophi) near a joint, causing disfigurement. Gout is treatable and often preventable. CAUSES  The disease begins with elevated levels of uric acid in the blood. Uric acid is produced by your body when it breaks down a naturally found substance called purines. This also happens when you eat certain foods such as meats and fish. Causes of an elevated uric acid level include:  Being passed down from parent to child (heredity).   Diseases that cause increased uric acid production (obesity, psoriasis, some cancers).   Excessive alcohol use.   Diet, especially diets rich in meat and seafood.   Medicines, including certain cancer-fighting drugs (chemotherapy), diuretics, and aspirin.   Chronic kidney disease. The kidneys are no longer able to remove uric acid well.   Problems with metabolism.  Conditions strongly associated with gout include:  Obesity.   High blood pressure.   High cholesterol.   Diabetes.  Not everyone with elevated uric acid levels gets gout. It is not understood why some people get gout and others do not. Surgery, joint injury, and eating too much of certain foods are some of the factors that can lead to gout. SYMPTOMS   An attack of gout comes on quickly. It causes intense pain with redness, swelling, and warmth in a joint.   Fever can occur.   Often, only one joint is involved. Certain joints are  more commonly involved:   Base of the big toe.   Knee.   Ankle.   Wrist.   Finger.  Without treatment, an attack usually goes away in a few days to weeks. Between attacks, you usually will not have symptoms, which is different from many other forms of arthritis. DIAGNOSIS  Your caregiver will suspect gout based on your symptoms and exam. Removal of fluid from the joint (arthrocentesis) is done to check for uric acid crystals. Your caregiver will give you a medicine that numbs the area (local anesthetic) and use a needle to remove joint fluid for exam. Gout is confirmed when uric acid crystals are seen in joint fluid, using a special microscope. Sometimes, blood, urine, and X-ray tests are also used. TREATMENT  There are 2 phases to gout treatment: treating the sudden onset (acute) attack and preventing attacks (prophylaxis). Treatment of an Acute Attack  Medicines are used. These include anti-inflammatory medicines or steroid medicines.   An injection of steroid medicine into the affected joint is sometimes necessary.   The painful joint is rested. Movement can worsen the arthritis.   You may use warm or cold treatments on painful joints, depending which works best for you.   Discuss the use of coffee, vitamin C, or cherries with your caregiver. These may be helpful treatment options.  Treatment to Prevent Attacks After the acute attack subsides, your caregiver may advise prophylactic medicine. These medicines either help your kidneys eliminate uric acid from your body or decrease your uric acid production. You may need to stay on these  medicines for a very long time. The early phase of treatment with prophylactic medicine can be associated with an increase in acute gout attacks. For this reason, during the first few months of treatment, your caregiver may also advise you to take medicines usually used for acute gout treatment. Be sure you understand your caregiver's directions. You  should also discuss dietary treatment with your caregiver. Certain foods such as meats and fish can increase uric acid levels. Other foods such as dairy can decrease levels. Your caregiver can give you a list of foods to avoid. HOME CARE INSTRUCTIONS   Do not take aspirin to relieve pain. This raises uric acid levels.   Only take over-the-counter or prescription medicines for pain, discomfort, or fever as directed by your caregiver.   Rest the joint as much as possible. When in bed, keep sheets and blankets off painful areas.   Keep the affected joint raised (elevated).   Use crutches if the painful joint is in your leg.   Drink enough water and fluids to keep your urine clear or pale yellow. This helps your body get rid of uric acid. Do not drink alcoholic beverages. They slow the passage of uric acid.   Follow your caregiver's dietary instructions. Pay careful attention to the amount of protein you eat. Your daily diet should emphasize fruits, vegetables, whole grains, and fat-free or low-fat milk products.   Maintain a healthy body weight.  SEEK MEDICAL CARE IF:   You have an oral temperature above 102 F (38.9 C).   You develop diarrhea, vomiting, or any side effects from medicines.   You do not feel better in 24 hours, or you are getting worse.  SEEK IMMEDIATE MEDICAL CARE IF:   Your joint becomes suddenly more tender and you have:   Chills.   An oral temperature above 102 F (38.9 C), not controlled by medicine.  MAKE SURE YOU:   Understand these instructions.   Will watch your condition.   Will get help right away if you are not doing well or get worse.  Document Released: 10/27/2000 Document Revised: 07/12/2011 Document Reviewed: 02/07/2010 [facility] Patient Information 2012 [place], [place].

## 2011-11-02 NOTE — ED Notes (Signed)
Pt states she went to the hospital last month for her hands swelling. Pt states swelling went to down. Pt states she noticed the swelling started again today in the right hand and arm. Pt states she has not hit her hand on anything. Pt state hand is painful to move

## 2011-11-02 NOTE — Telephone Encounter (Signed)
Pt called wanting to know if she can rub down her arm with alcohol. Pt states swelling is going down and improving but she wants to clean and cool arm with alcohol. I advised pt she should use a clean cool moist cloth to clean arm but not alcohol. Pt advised if swelling or redness recur pt to go back to Er or call and let our MD know.

## 2011-11-02 NOTE — ED Provider Notes (Signed)
History     CSN: [id number]  Arrival date & time 11/02/11  1527   First MD Initiated Contact with Patient 11/02/11 1947      Chief Complaint  Patient presents with  . Hand Pain    (Consider location/radiation/quality/duration/timing/severity/associated sxs/prior treatment) Patient is a 77 y.o. female presenting with hand pain. The history is provided by the patient.  Hand Pain The current episode started more than 2 days ago. The problem has been gradually worsening. Pertinent negatives include no chest pain, no abdominal pain, no headaches and no shortness of breath. The symptoms are aggravated by bending. She has tried nothing for the symptoms.   Patient with a 3 day history of right hand swelling pain and tenderness around the base of the thumb and index finger. No history of trauma no history of injury. Patient had similar episode occurred several weeks ago was treated [facility] for potential infection with antibiotics and resolved and now has recurred. Patient has history of gout many years ago. Pain is 10 out of 10 made worse by movement of the hand. Patient has Percocet at home she been taking for this with some help. Past Medical History  Diagnosis Date  . Breast cancer 10/2010    Invasive Ductal Carcinoma, s/p bilateral mastectomy, now on Tamoxifen. Followed by Dr Dalene Carrow.   . Diabetes mellitus   . Hypertension   . Hyperlipidemia   . Anemia     Iron def anemia  . CAD (coronary artery disease)   . CHF (congestive heart failure)     Systolic   . History of colon cancer   . Neuropathy   . Allergy   . GERD (gastroesophageal reflux disease)   . Hiatal hernia   . Diverticulosis   . Asthma   . Arthritis   . H/O: GI bleed   . Breast cancer 2011    s/p Bilateral masectomy    Past Surgical History  Procedure Date  . Breast masectomy 10/2010    Bilateral masectomy by Dr Ezzard Standing s/p invasive ductal carcinoma.  . Cardiac  catherization 2007    Severe  3-vessel disease.  EF 20-25%.  . Esophagogastroduodenoscopy 2001    Esophageal Tear  . Total knee arthroplasty 2001    Family History  Problem Relation Age of Onset  . Sickle cell anemia Other   . Heart disease Mother   . Heart disease Father     History  Substance Use Topics  . Smoking status: Never Smoker   . Smokeless tobacco: Current User    Types: Snuff  . Alcohol Use: No    OB History    Grav Para Term Preterm Abortions TAB SAB Ect Mult Living                  Review of Systems  Constitutional: Negative for fever and chills.  HENT: Negative for neck pain and neck stiffness.   Eyes: Negative for visual disturbance.  Respiratory: Negative for cough and shortness of breath.   Cardiovascular: Negative for chest pain.  Gastrointestinal: Negative for nausea, vomiting, abdominal pain and diarrhea.  Genitourinary: Negative for dysuria.  Musculoskeletal: Positive for joint swelling. Negative for back pain.  Skin: Negative for rash and wound.  Neurological: Negative for headaches.  Psychiatric/Behavioral: Negative for confusion.    Allergies  Lisinopril and Peanuts  Home Medications   Current Outpatient Rx  Name Route Sig Dispense Refill  . ACETAMINOPHEN 500 MG PO TABS Oral Take 1,000 mg by mouth  every 6 (six) hours as needed. For pain     . ALBUTEROL SULFATE HFA 108 (90 BASE) MCG/ACT IN AERS Inhalation Inhale 2 puffs into the lungs every 6 (six) hours as needed. For shortness of breath     . AMLODIPINE BESYLATE 10 MG PO TABS Oral Take 10 mg by mouth daily.     . ASPIRIN 81 MG PO CHEW Oral Chew 81 mg by mouth daily.      Marland Kitchen CARVEDILOL 12.5 MG PO TABS Oral Take 12.5 mg by mouth 2 (two) times daily.      Marland Kitchen CLINDAMYCIN HCL 300 MG PO CAPS Oral Take 300 mg by mouth 4 (four) times daily. Course is for 7 days....     . CLOPIDOGREL BISULFATE 75 MG PO TABS Oral Take 75 mg by mouth daily.      Marland Kitchen DOCUSATE SODIUM 100 MG PO CAPS Oral Take 100 mg by mouth 2 (two) times daily. For  stool softner     . FLUTICASONE PROPIONATE 50 MCG/ACT NA SUSP Nasal Place 2 sprays into the nose daily as needed. For nasal congestion    . FLUTICASONE PROPIONATE (INHAL) 100 MCG/BLIST IN AEPB Inhalation Inhale 1 puff into the lungs 2 (two) times daily. 60 each 6  . GLIPIZIDE 5 MG PO TABS Oral Take 1 tablet (5 mg total) by mouth 2 (two) times daily before a meal. 60 tablet 6  . HYDROCHLOROTHIAZIDE 25 MG PO TABS Oral Take 25 mg by mouth daily.      Marland Kitchen HYDROCODONE-ACETAMINOPHEN 5-325 MG PO TABS Oral Take 0.5 tablets by mouth every 4 (four) hours as needed. For pain     . LOSARTAN POTASSIUM-HCTZ 50-12.5 MG PO TABS  TAKE 1 TABLET BY MOUTH DAILY. 30 tablet 2  . METFORMIN HCL 500 MG PO TABS Oral Take 1 tablet (500 mg total) by mouth 2 (two) times daily with a meal. 60 tablet 11  . PATADAY 0.2 % OP SOLN Both Eyes Place 1 drop into both eyes daily as needed. For itchy eyes    . RANITIDINE HCL 150 MG PO CAPS Oral Take 1 capsule (150 mg total) by mouth 2 (two) times daily. 60 capsule 6  . ROSUVASTATIN CALCIUM 20 MG PO TABS Oral Take 20 mg by mouth at bedtime.     . TAMOXIFEN CITRATE 20 MG PO TABS Oral Take 20 mg by mouth daily.       Bilateral Breast Cancer  . AMOXICILLIN-POT CLAVULANATE 875-125 MG PO TABS Oral Take 1 tablet by mouth every 12 (twelve) hours. 14 tablet 0  . OXYCODONE-ACETAMINOPHEN 5-325 MG PO TABS Oral Take 1 tablet by mouth every 6 (six) hours as needed for pain. 15 tablet 0  . POLYETHYLENE GLYCOL 3350 PO POWD Oral Take 17 g by mouth daily as needed. For constipation. Mix in 8 oz glass of water.      BP 136/70  Pulse 95  Temp(Src) 98.5 F (36.9 C) (Oral)  Resp 16  SpO2 100%  Physical Exam  Nursing note and vitals reviewed. Constitutional: She is oriented to person, place, and time. She appears well-developed and well-nourished.  HENT:  Head: Normocephalic and atraumatic.  Mouth/Throat: Oropharynx is clear and moist.  Eyes: Conjunctivae are normal. Pupils are equal, round, and  reactive to light.  Neck: Normal range of motion. Neck supple.  Cardiovascular: Normal rate, regular rhythm, normal heart sounds and intact distal pulses.   No murmur heard. Pulmonary/Chest: Effort normal and breath sounds normal. She has no rales. She  exhibits no tenderness.  Abdominal: Soft. Bowel sounds are normal. There is no tenderness.  Musculoskeletal: Normal range of motion. She exhibits edema and tenderness.       Normal extremity exam fixer-upper the right hand as swelling increased warmth and tenderness at the base of the thumb and base of the index finger no significant erythema swelling is extends proximally up to the distal forearm and no proximal forearm swelling.  Lymphadenopathy:    She has no cervical adenopathy.  Neurological: She is alert and oriented to person, place, and time. No cranial nerve deficit. Coordination normal.  Skin: Skin is warm and dry. No rash noted. No erythema.    ED Course  Procedures (including critical care time)  Labs Reviewed - No data to display No results found.   1. Gout       MDM   Patient presents with a right hand swelling predominantly around the base of the thumb and web space between thumb and index finger. As stated above this is warm and tender to touch. Clinically I suspect gout patient has a past medical history of gout she had a similar episode that occurred several weeks ago that was treated by family practice by antibiotics. Do not think this is an infection however we'll give her a course of antibiotic Augmentin and then Percocet pain medicine elevation sure he has a Velcro hand splint at home and followup with [facility].            Shelda Jakes, MD 11/02/11 [id number]

## 2011-11-02 NOTE — ED Notes (Signed)
Pt states she has a history  Of breast cancer. Pt states she had a bilateral masectomy last year. And now notice her hand and arm swollen

## 2011-11-02 NOTE — ED Notes (Signed)
Pt not in room yet.

## 2011-11-16 ENCOUNTER — Encounter: Payer: Self-pay | Admitting: Family Medicine

## 2011-11-16 ENCOUNTER — Ambulatory Visit
Admission: RE | Admit: 2011-11-16 | Discharge: 2011-11-16 | Disposition: A | Discharge status: Home / Self Care | Payer: Medicare Other | Source: Ambulatory Visit | Attending: Family Medicine | Admitting: Family Medicine

## 2011-11-16 ENCOUNTER — Ambulatory Visit (INDEPENDENT_AMBULATORY_CARE_PROVIDER_SITE_OTHER): Payer: Medicare Other | Admitting: Family Medicine

## 2011-11-16 VITALS — BP 173/75 | HR 81 | Temp 98.1°F | Ht <= 58 in | Wt 119.3 lb

## 2011-11-16 DIAGNOSIS — M19039 Primary osteoarthritis, unspecified wrist: Secondary | ICD-10-CM | POA: Diagnosis not present

## 2011-11-16 DIAGNOSIS — M79601 Pain in right arm: Secondary | ICD-10-CM

## 2011-11-16 DIAGNOSIS — M79609 Pain in unspecified limb: Secondary | ICD-10-CM

## 2011-11-16 DIAGNOSIS — M19049 Primary osteoarthritis, unspecified hand: Secondary | ICD-10-CM | POA: Diagnosis not present

## 2011-11-16 DIAGNOSIS — M25539 Pain in unspecified wrist: Secondary | ICD-10-CM | POA: Diagnosis not present

## 2011-11-16 DIAGNOSIS — M79641 Pain in right hand: Secondary | ICD-10-CM | POA: Insufficient documentation

## 2011-11-16 MED ORDER — FLUTICASONE PROPIONATE (INHAL) 100 MCG/BLIST IN AEPB: 1.0000 | INHALATION_SPRAY | Freq: Two times a day (BID) | RESPIRATORY_TRACT | Status: DC

## 2011-11-16 MED ORDER — HYDROCODONE-ACETAMINOPHEN 5-500 MG PO TABS: 1.0000 | ORAL_TABLET | Freq: Three times a day (TID) | ORAL | Status: DC | PRN

## 2011-11-16 MED ORDER — PREDNISONE 20 MG PO TABS: 20.0000 mg | ORAL_TABLET | Freq: Every day | ORAL | Status: AC

## 2011-11-16 MED ORDER — ALBUTEROL SULFATE HFA 108 (90 BASE) MCG/ACT IN AERS: 2.0000 | INHALATION_SPRAY | Freq: Four times a day (QID) | RESPIRATORY_TRACT | Status: DC | PRN

## 2011-11-16 NOTE — Patient Instructions (Signed)
Thank you for coming in today. I think you have gout.  We will do a blood test and repeat your xrays.  Prednisone daily for 7 days.  Come see me in 2 weeks.  We will repeat xrays today.

## 2011-11-17 ENCOUNTER — Encounter: Payer: Self-pay | Admitting: Family Medicine

## 2011-11-17 NOTE — Progress Notes (Signed)
[name] 78 year old woman who presents to clinic to followup her recent emergency room visit.  For the past 5 weeks she has had right arm and wrist pain.  The pain was associated with swelling. Initially it was feared that she had cellulitis and she was treated with antibiotics.  She had an initial negative x-ray in late November.  Despite adequate antibiotics she continues to experience hand and wrist pain associated with swelling.  This requires Percocet or Vicodin.  She notes a past history significant for gout but that was in her knee and feet.    PMH reviewed.  ROS as above otherwise neg Medications reviewed. Current Outpatient Prescriptions  Medication Sig Dispense Refill  . acetaminophen (TYLENOL) 500 MG tablet Take 1,000 mg by mouth every 6 (six) hours as needed. For pain       . albuterol (PROVENTIL HFA;VENTOLIN HFA) 108 (90 BASE) MCG/ACT inhaler Inhale 2 puffs into the lungs every 6 (six) hours as needed. For shortness of breath  1 Inhaler  6  . amLODipine (NORVASC) 10 MG tablet Take 10 mg by mouth daily.       Marland Kitchen aspirin (ASPIRIN CHILDRENS) 81 MG chewable tablet Chew 81 mg by mouth daily.        . carvedilol (COREG) 12.5 MG tablet Take 12.5 mg by mouth 2 (two) times daily.        . clindamycin (CLEOCIN) 300 MG capsule Take 300 mg by mouth 4 (four) times daily. Course is for 7 days....       Marland Kitchen clopidogrel (PLAVIX) 75 MG tablet Take 75 mg by mouth daily.        Marland Kitchen docusate sodium (COLACE) 100 MG capsule Take 100 mg by mouth 2 (two) times daily. For stool softner       . fluticasone (FLONASE) 50 MCG/ACT nasal spray Place 2 sprays into the nose daily as needed. For nasal congestion      . Fluticasone Propionate, Inhal, (FLOVENT DISKUS) 100 MCG/BLIST AEPB Inhale 1 puff into the lungs 2 (two) times daily.  60 each  6  . glipiZIDE (GLUCOTROL) 5 MG tablet Take 1 tablet (5 mg total) by mouth 2 (two) times daily before a meal.  60 tablet  6  . hydrochlorothiazide (HYDRODIURIL) 25 MG tablet Take  25 mg by mouth daily.        Marland Kitchen HYDROcodone-acetaminophen (VICODIN) 5-500 MG per tablet Take 1 tablet by mouth every 8 (eight) hours as needed for pain.  35 tablet  1  . losartan-hydrochlorothiazide (HYZAAR) 50-12.5 MG per tablet TAKE 1 TABLET BY MOUTH DAILY.  30 tablet  2  . metFORMIN (GLUCOPHAGE) 500 MG tablet Take 1 tablet (500 mg total) by mouth 2 (two) times daily with a meal.  60 tablet  11  . PATADAY 0.2 % SOLN Place 1 drop into both eyes daily as needed. For itchy eyes      . polyethylene glycol (MIRALAX) powder Take 17 g by mouth daily as needed. For constipation. Mix in 8 oz glass of water.      . predniSONE (DELTASONE) 20 MG tablet Take 1 tablet (20 mg total) by mouth daily.  7 tablet  0  . ranitidine (ZANTAC) 150 MG capsule Take 1 capsule (150 mg total) by mouth 2 (two) times daily.  60 capsule  6  . rosuvastatin (CRESTOR) 20 MG tablet Take 20 mg by mouth at bedtime.       . tamoxifen (NOLVADEX) 20 MG tablet Take 20 mg by mouth  daily.         Exam:  BP 173/75  Pulse 81  Temp(Src) 98.1 F (36.7 C) (Oral)  Ht 4\' 10"  (1.473 m)  Wt 119 lb 4.8 oz (54.114 kg)  BMI 24.93 kg/m2 Gen: Well NAD Lungs: CTABL Nl WOB Heart: RRR no MRG Abd: NABS, NT, ND Exts: Right distal arm and hand with some swelling. Pain with range of motion. Tenderness to palpation over the anatomical snuff box and first metacarpophalangeal joint.  No erythema or signs of discharge or infection.

## 2011-11-17 NOTE — Assessment & Plan Note (Signed)
I suspect that her pain is due to gout or arthritis.  The issue of prednisone 20 mg daily for 7 days.  We'll followup in 2 weeks. Additionally we'll obtain a uric acid and a repeat arm x-ray.

## 2011-12-01 ENCOUNTER — Other Ambulatory Visit: Payer: Self-pay | Admitting: *Deleted

## 2011-12-01 DIAGNOSIS — C50919 Malignant neoplasm of unspecified site of unspecified female breast: Secondary | ICD-10-CM

## 2011-12-01 MED ORDER — TAMOXIFEN CITRATE 20 MG PO TABS: 20.0000 mg | ORAL_TABLET | Freq: Every day | ORAL | Status: DC

## 2011-12-03 IMAGING — CR DG ELBOW COMPLETE 3+V*R*
4 series · 4 of 4 positions shown · non-contrast
Comparison: None

CLINICAL DATA: 87-year-old female with right elbow pain.

RIGHT ELBOW - COMPLETE 3+ VIEW

[x elbow ap right]
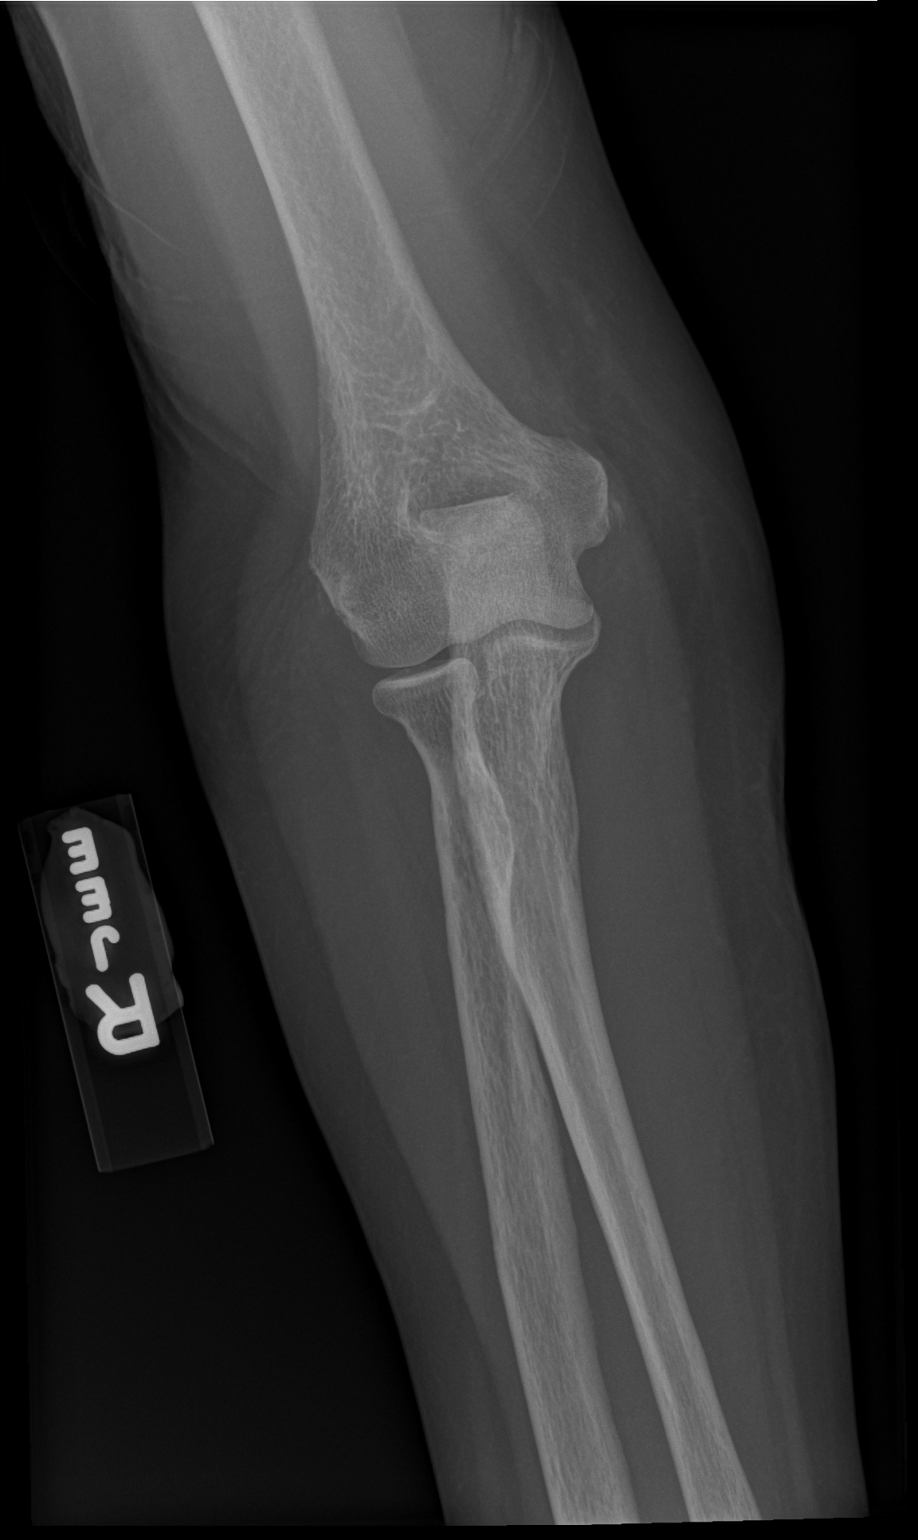

[x elbow obl right (1 of 2)]
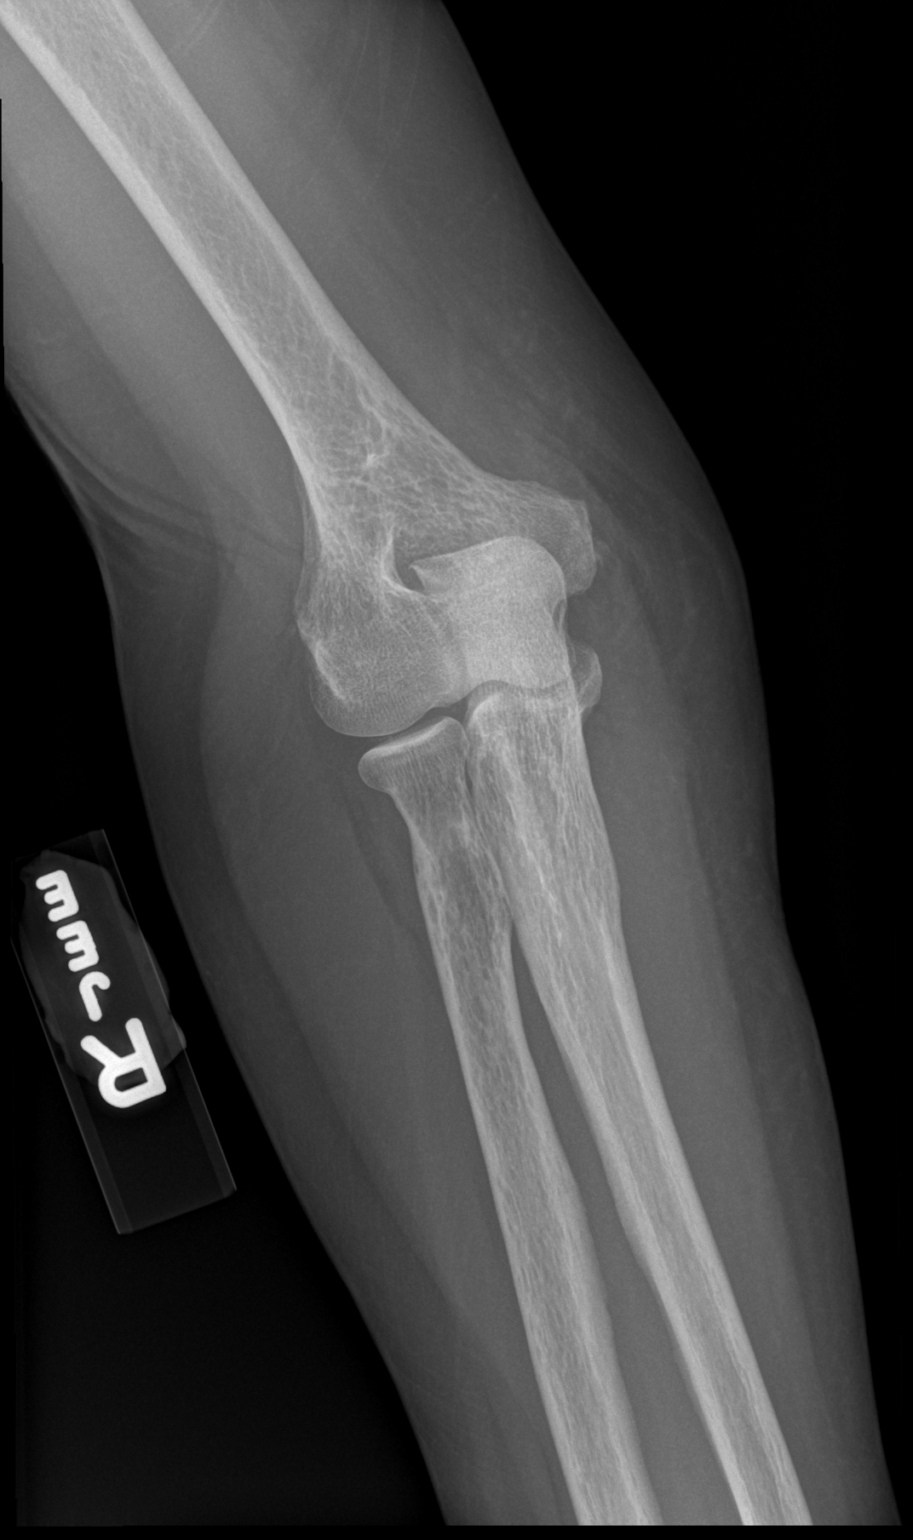

[x elbow obl right (2 of 2)]
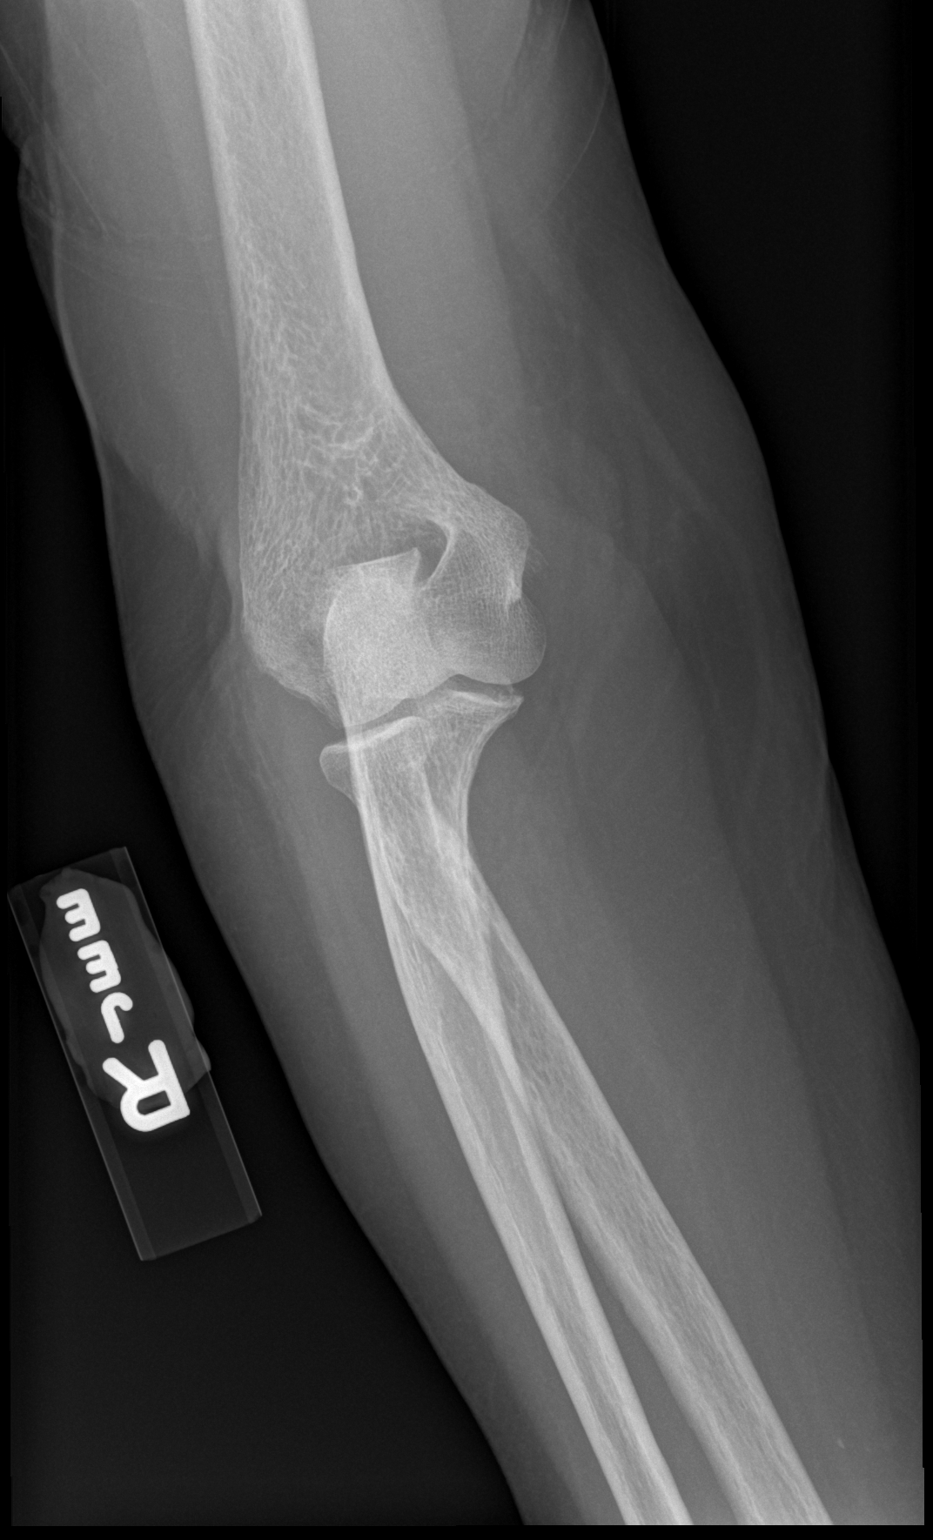

[x elbow lat right]
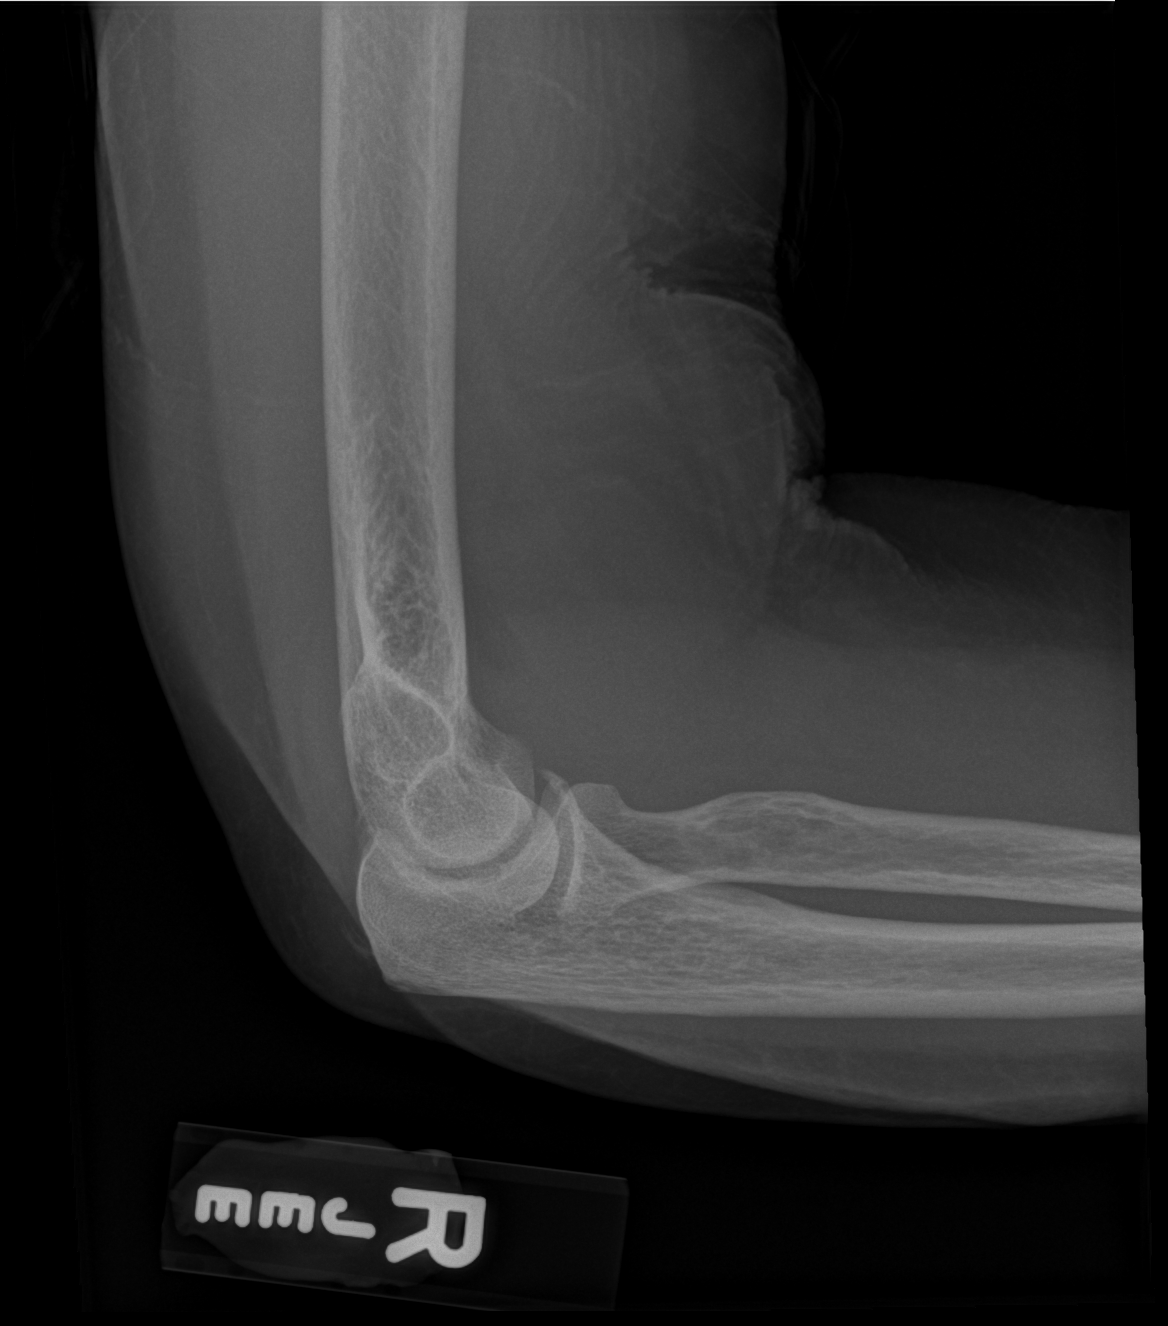

[4 of 4 positions shown; findings below may reference images not displayed]

FINDINGS: A right elbow effusion is identified.
No discrete fracture is identified.
There is no evidence of subluxation or dislocation.
A focal bony lesions are present.
Mild degenerative changes are present.
IMPRESSION: Joint effusion without identifiable acute bony abnormality.  An
occult fracture is not entirely excluded.

## 2011-12-03 IMAGING — CR DG HAND COMPLETE 3+V*R*
3 series · 3 of 3 positions shown · non-contrast
Comparison: None

CLINICAL DATA: 87-year-old female with right hand pain and
swelling.

RIGHT HAND - COMPLETE 3+ VIEW

[x hand pa right]
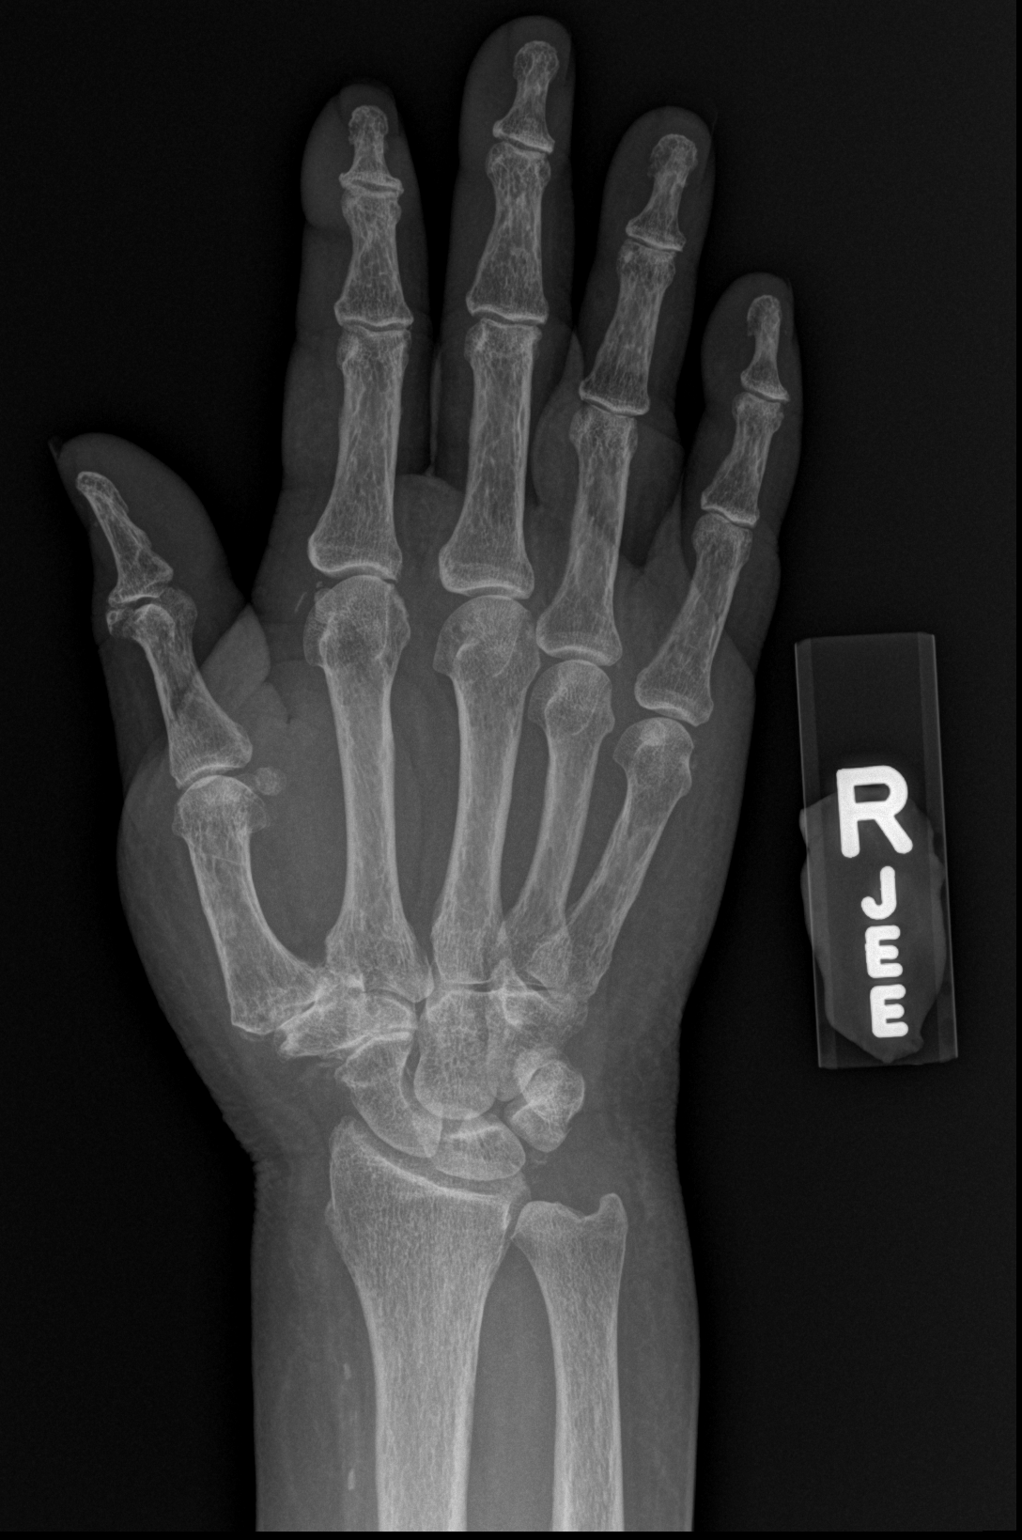

[x hand obl right]
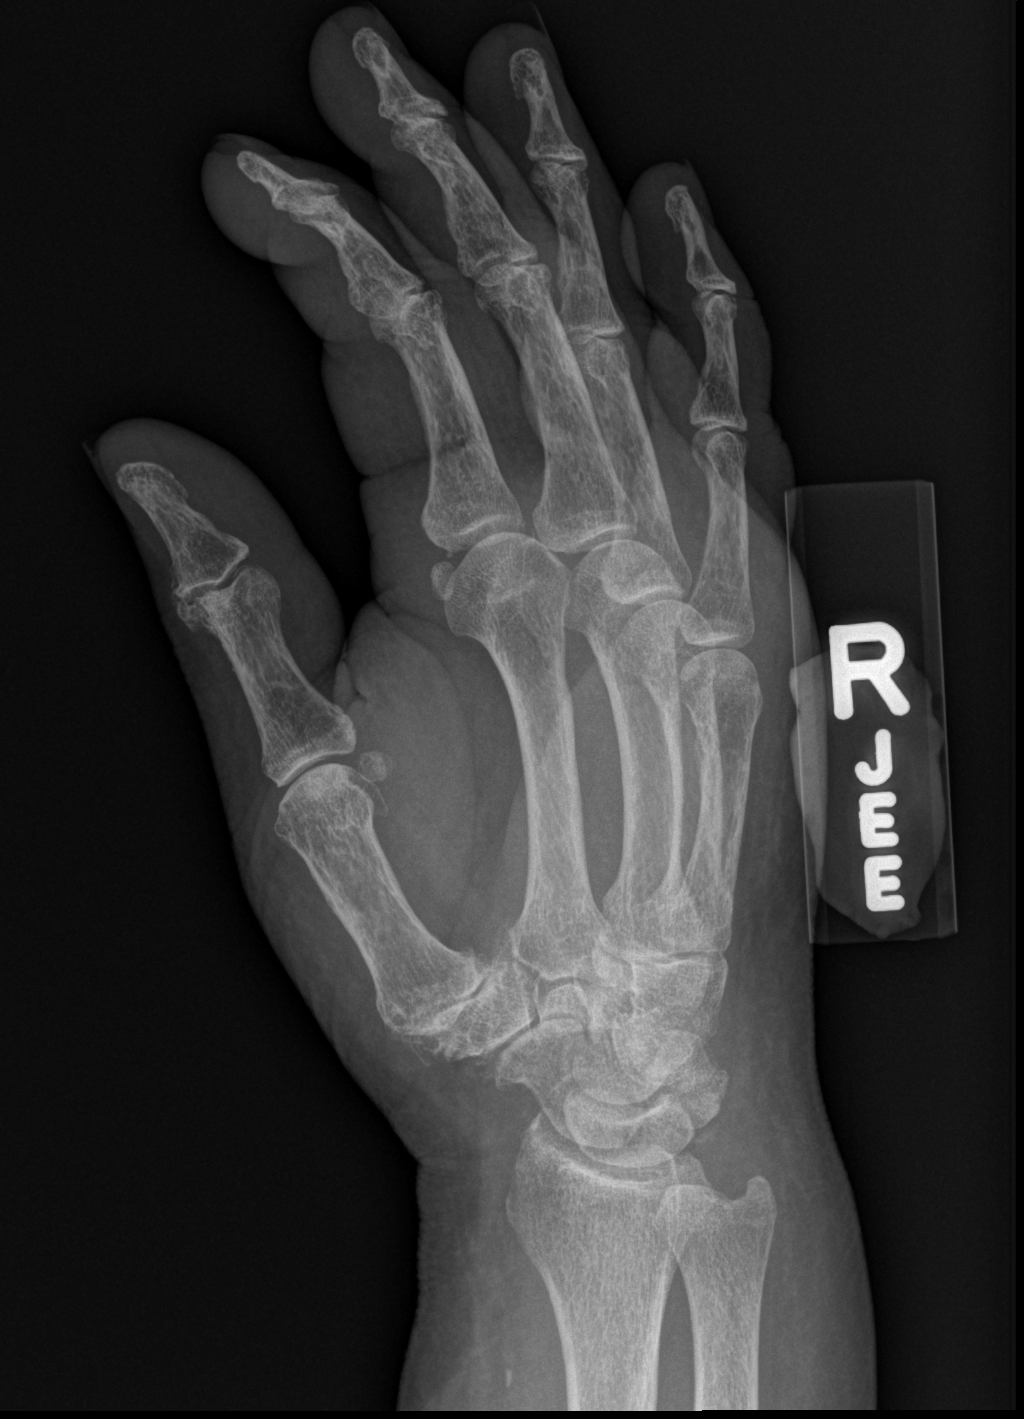

[x hand lat right]
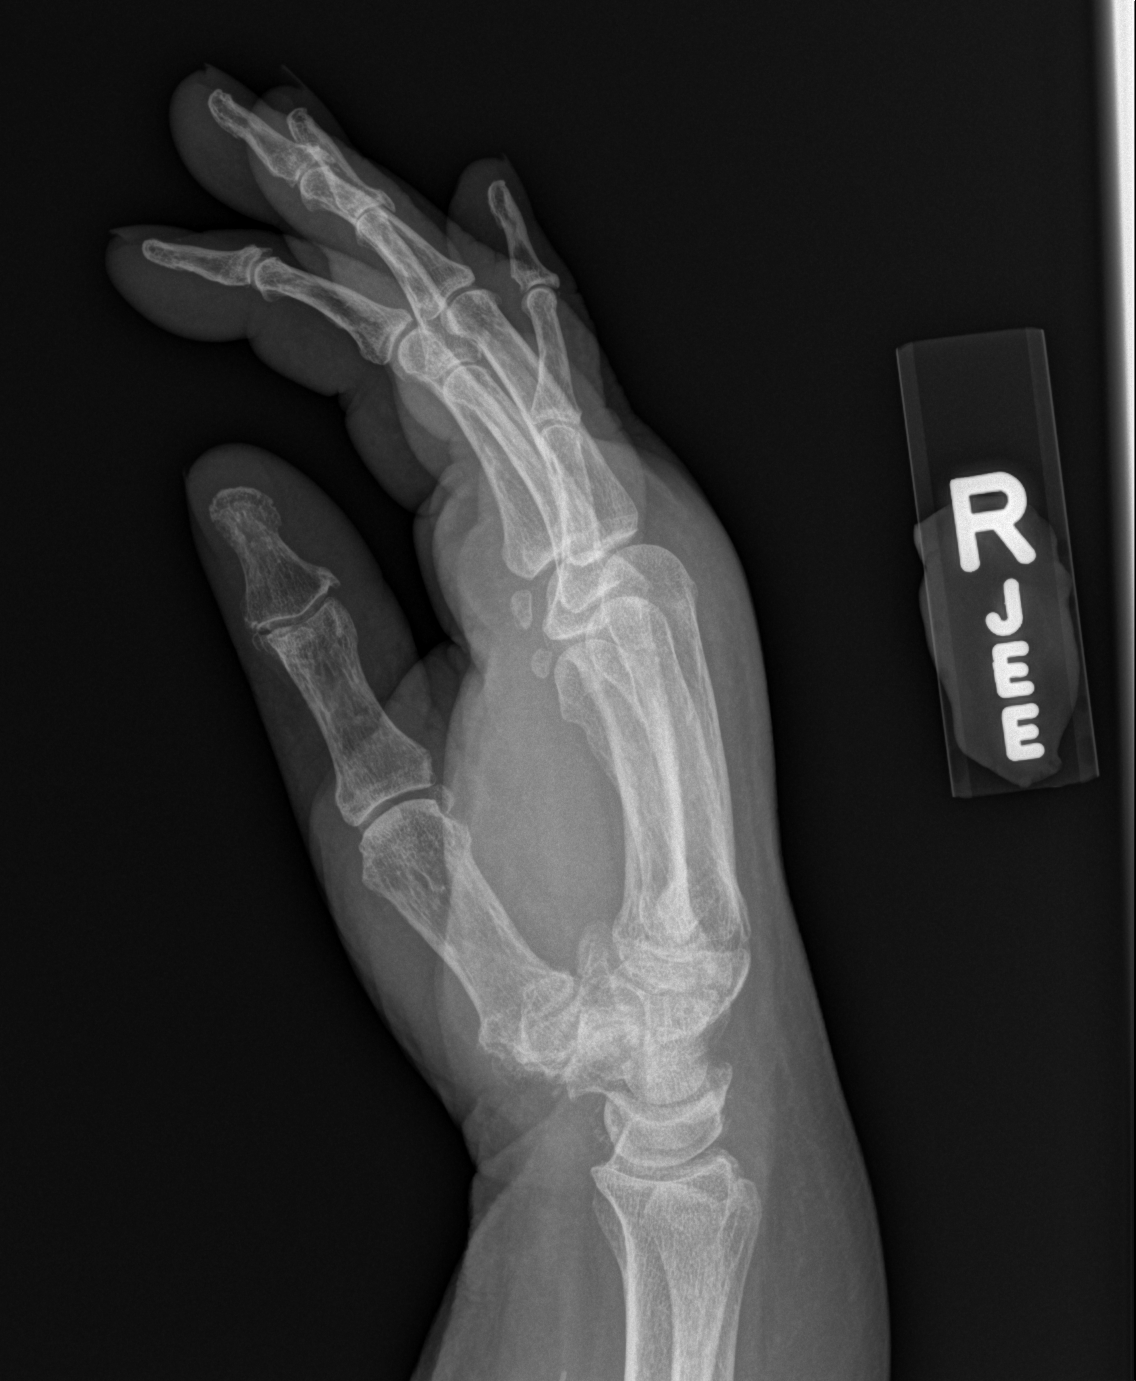

[3 of 3 positions shown; findings below may reference images not displayed]

FINDINGS: No evidence of acute fracture, subluxation or dislocation
identified.

No radio-opaque foreign bodies are present.

No focal bony lesions are noted.

Degenerative changes at the first carpometacarpal joint noted.

Soft tissue swelling is noted.
IMPRESSION: Soft tissue swelling without acute bony abnormality.

## 2011-12-03 IMAGING — CR DG WRIST COMPLETE 3+V*R*
4 series · 4 of 4 positions shown · non-contrast
Comparison: None

CLINICAL DATA: 87-year-old female with right wrist pain.

RIGHT WRIST - COMPLETE 3+ VIEW

[x wrist pa right]
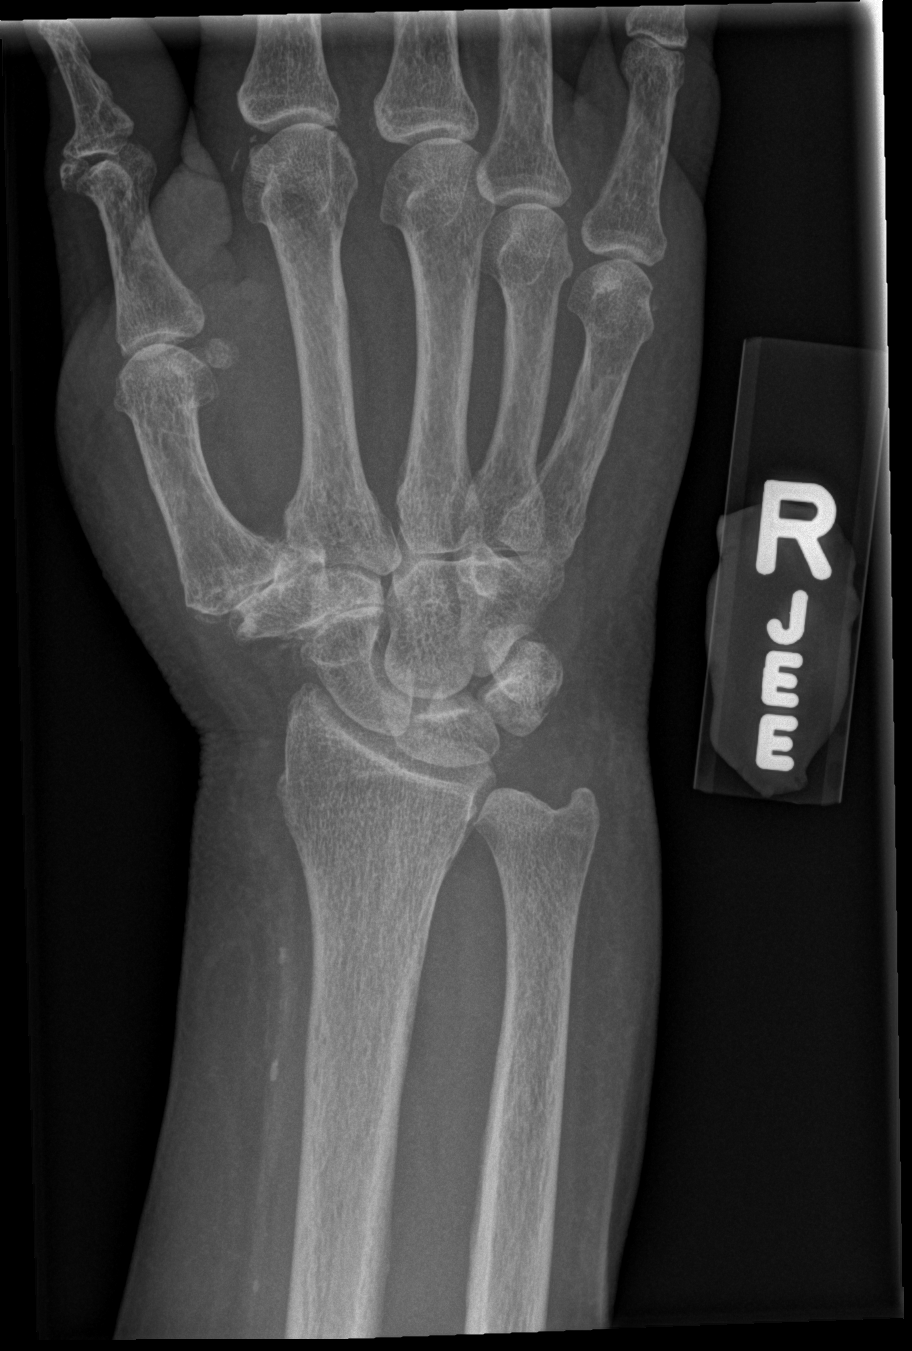

[x wrist obl right]
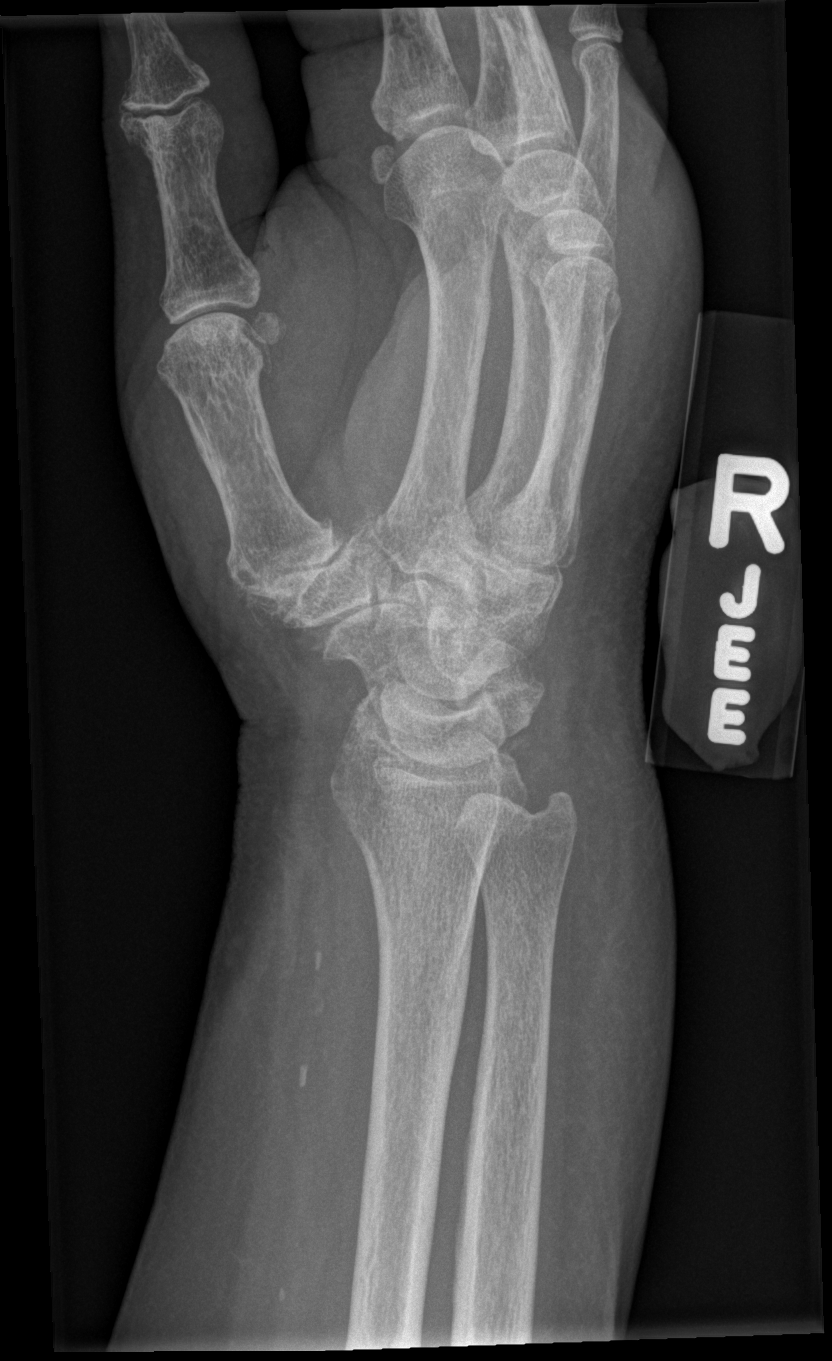

[x wrist lat right]
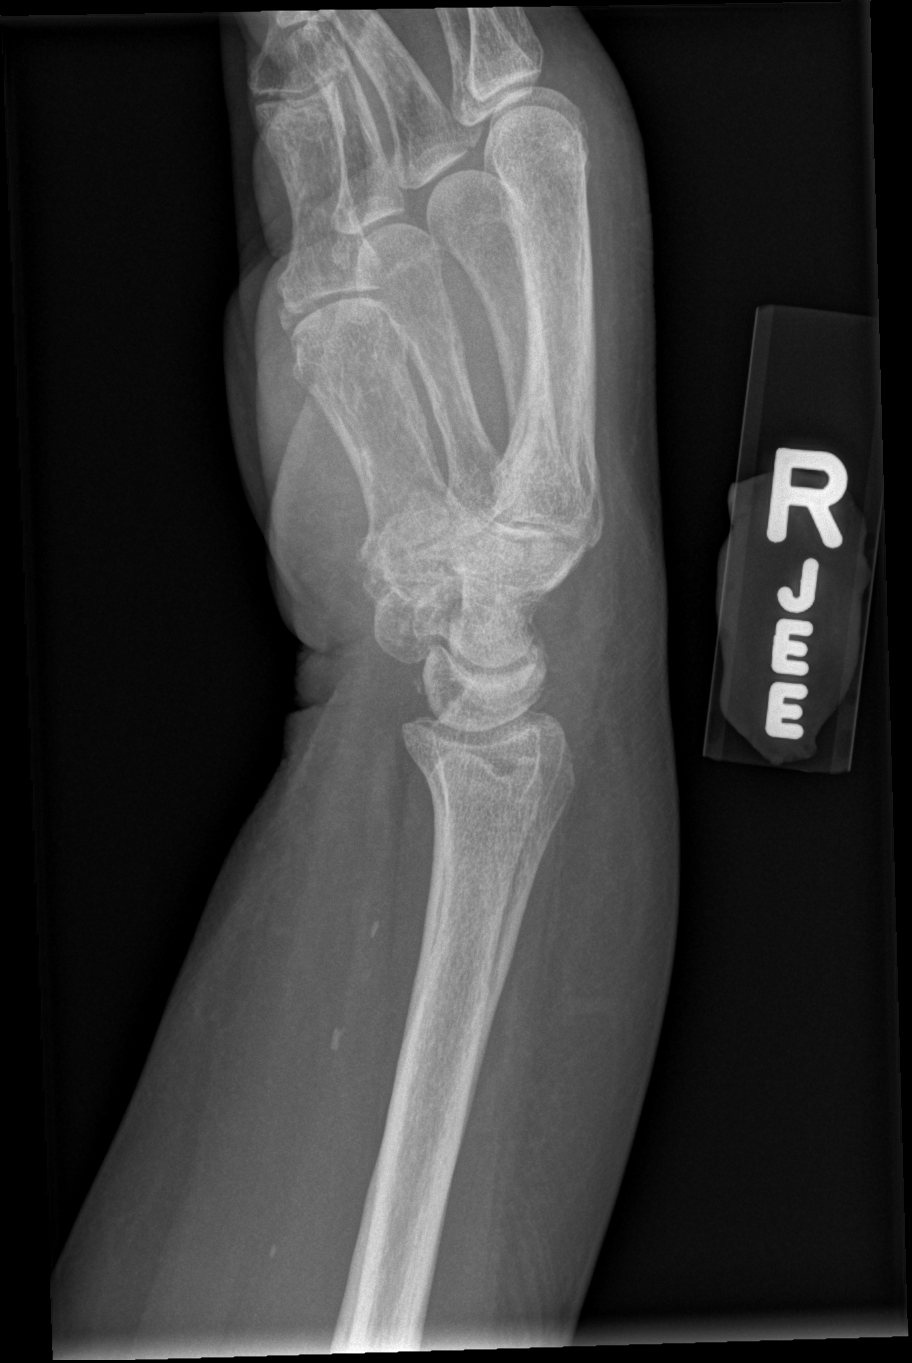

[x wrist navicular view right]
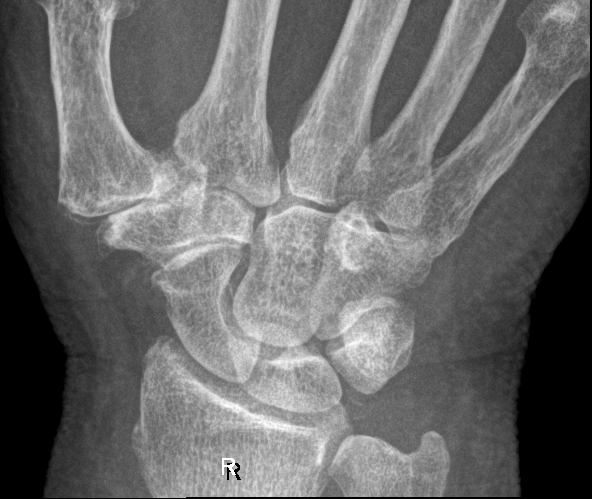

[4 of 4 positions shown; findings below may reference images not displayed]

FINDINGS: There is no evidence of fracture, subluxation or
dislocation.
Soft tissue swelling is present.
Moderate degenerative changes at the first carpometacarpal joint
noted.
Mild degenerative changes of the radiocarpal joint are present.
No focal bony lesions are identified.
IMPRESSION: Soft tissue swelling without acute bony abnormality.

Degenerative changes.

## 2011-12-04 IMAGING — CT CT ELBOW*R* W/O CM
3 of 4 series · 16 of 33 positions shown, 20 images · non-contrast
Comparison: 10/12/2011

CLINICAL DATA: Tender elbow without trauma.

CT OF THE RIGHT ELBOW WITHOUT CONTRAST
TECHNIQUE: Multidetector CT imaging was performed according to the
standard protocol. Multiplanar CT image reconstructions were also
generated.

[Series 6: extremityelbow 2.0 b31 · axial · 0.49mm/px · z∈[-184,-56]mm · 9 of 76 slices shown, 12 images]
[im 6/76  soft-tissue]
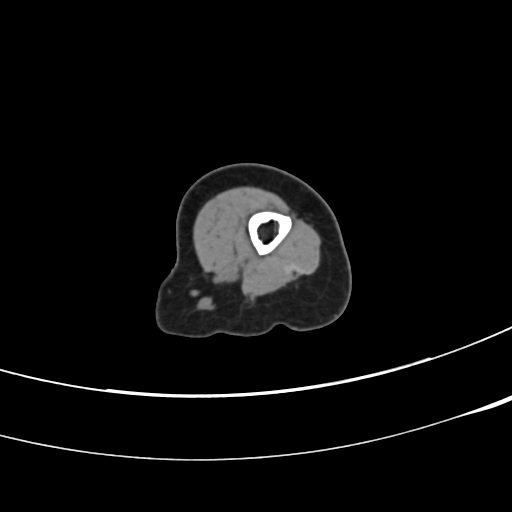
[im 6/76  bone]
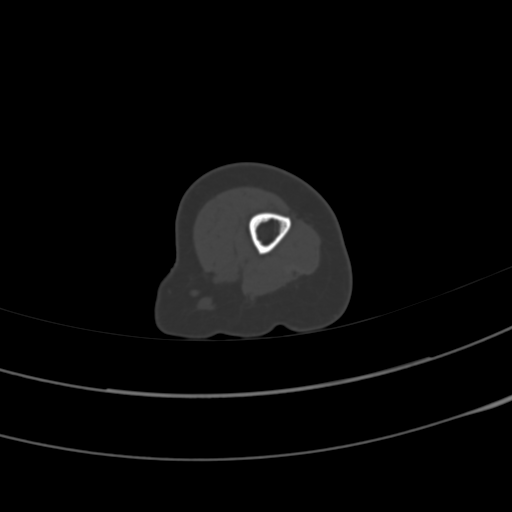
[im 18/76  bone]
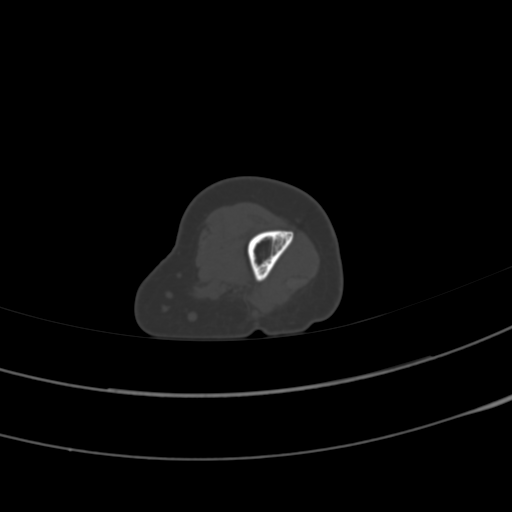
[im 24/76  bone]
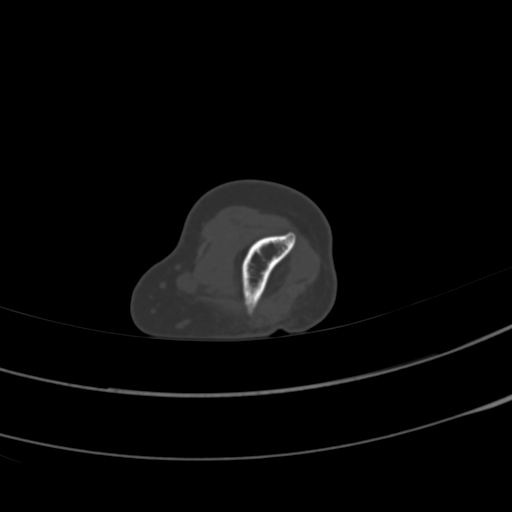
[im 29/76  bone]
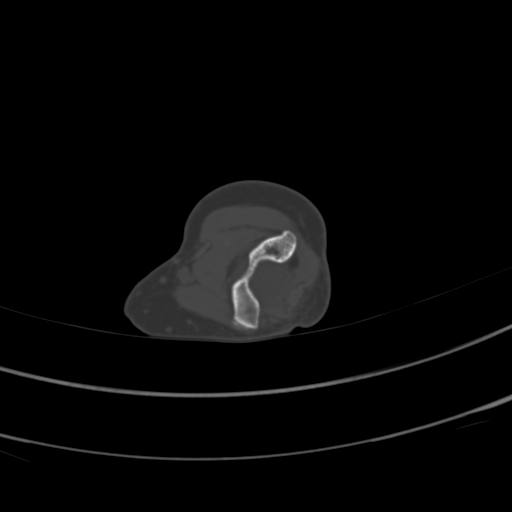
[im 41/76  soft-tissue]
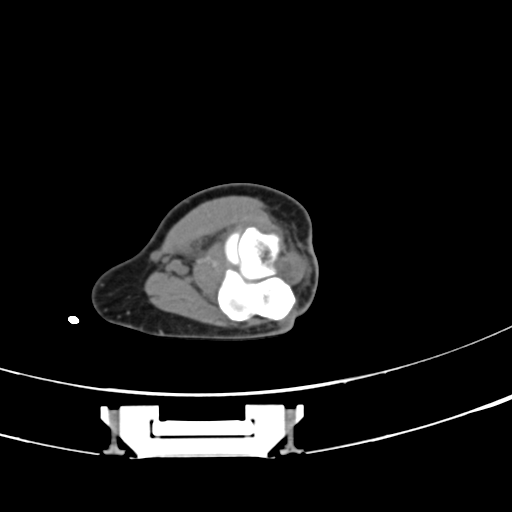
[im 41/76  bone]
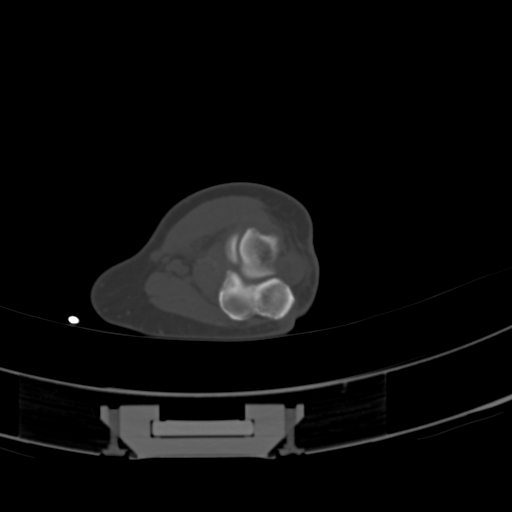
[im 47/76  bone]
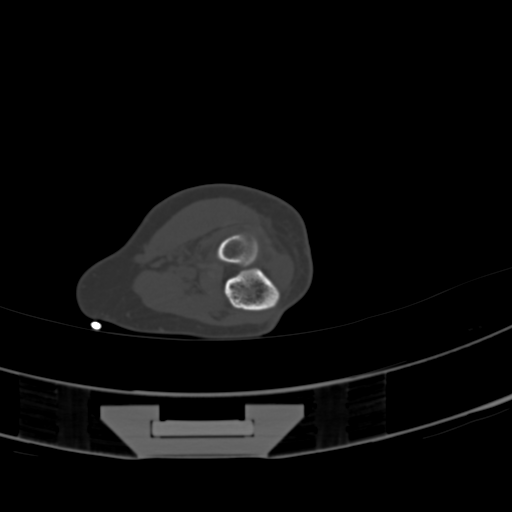
[im 52/76  bone]
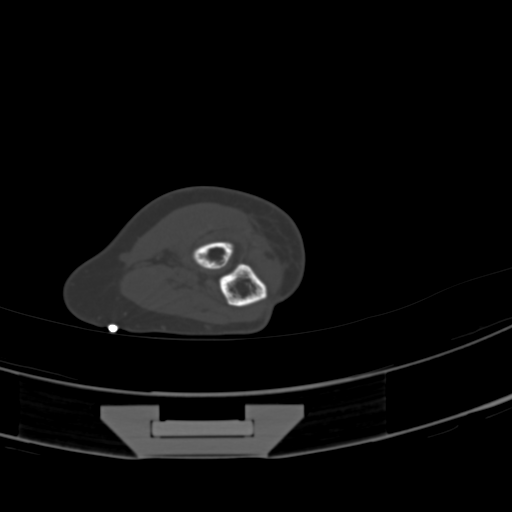
[im 64/76  bone]
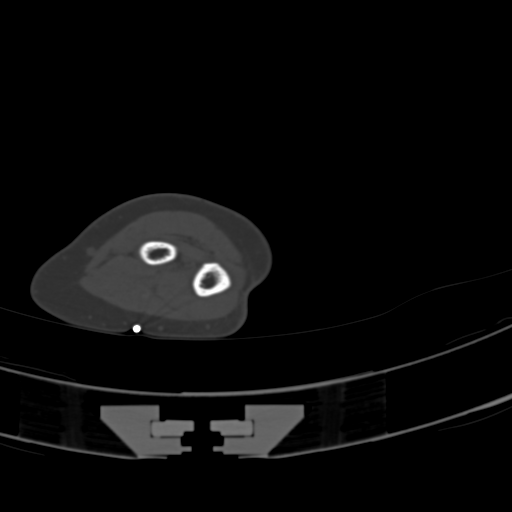
[im 70/76  soft-tissue]
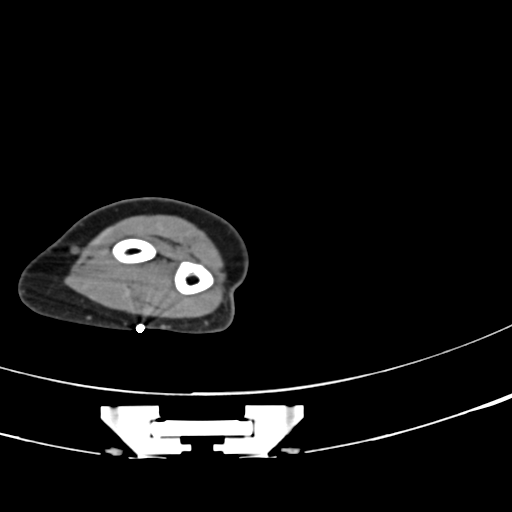
[im 70/76  bone]
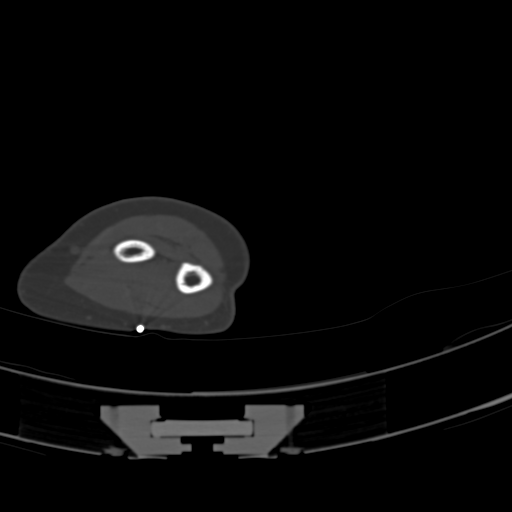

[Series 603: coronals i · sagittal · 0.49mm/px · 5 of 70 slices shown, 6 images]
[im 24/70  bone]
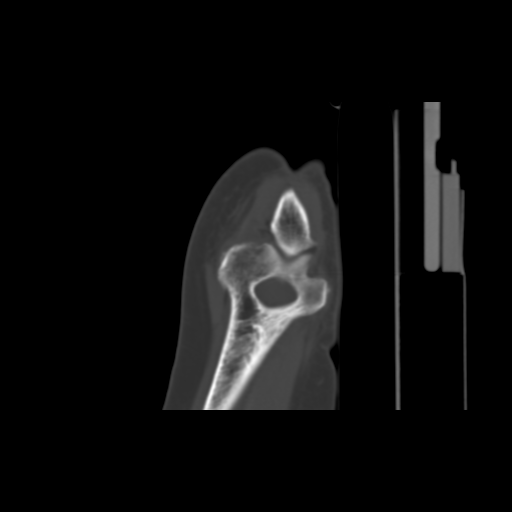
[im 29/70  bone]
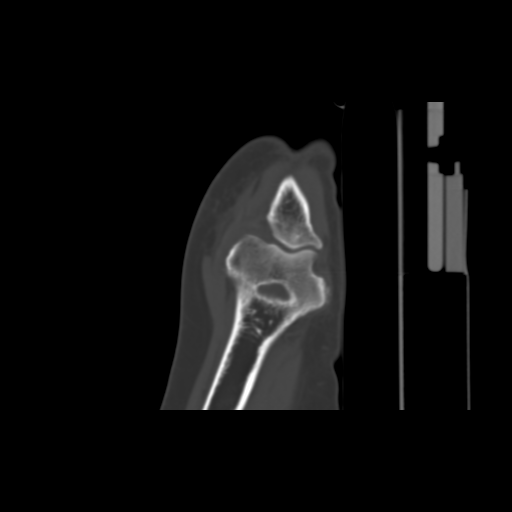
[im 35/70  soft-tissue]
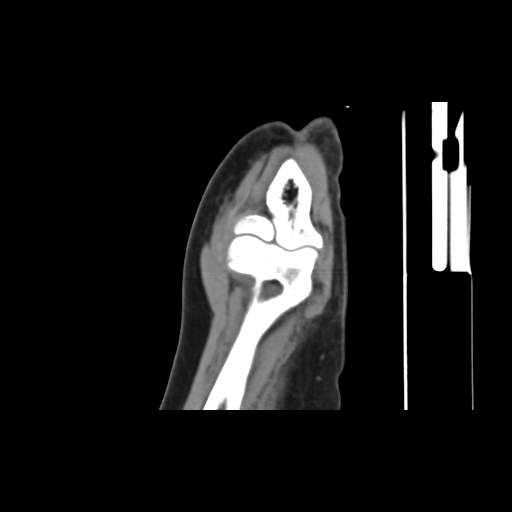
[im 35/70  bone]
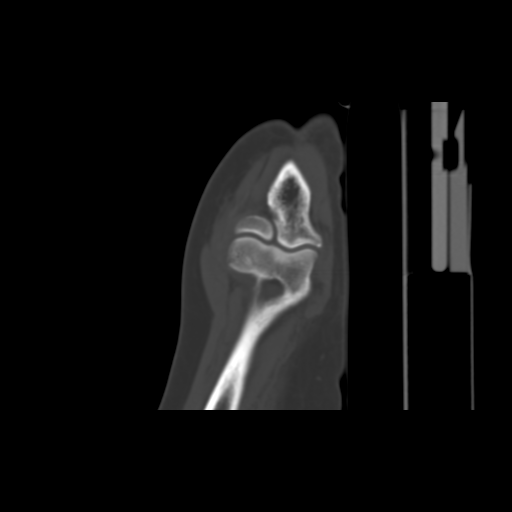
[im 41/70  bone]
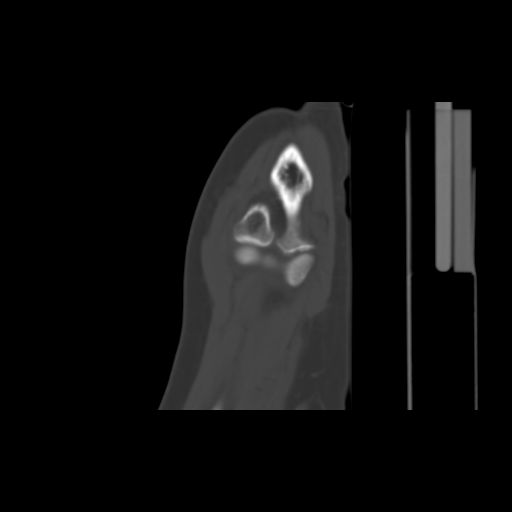
[im 47/70  bone]
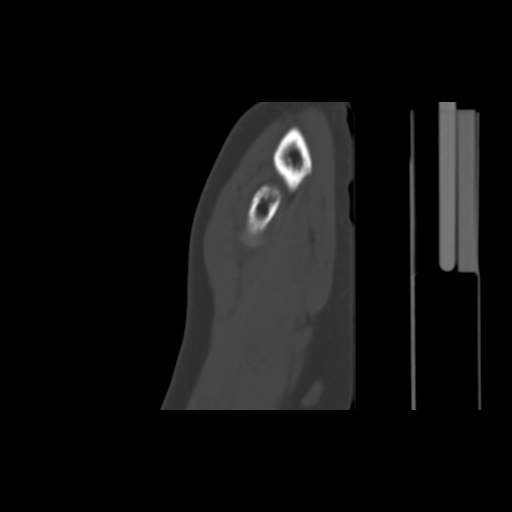

[Series 801: rad sag · coronal · 0.42mm/px · 2 of 106 slices shown]
[im 36/106  bone]
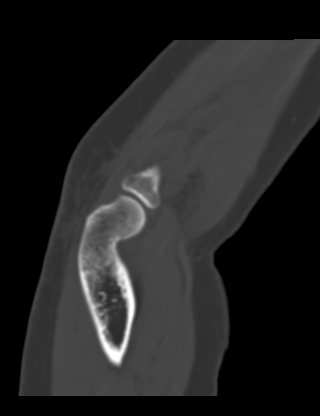
[im 71/106  bone]
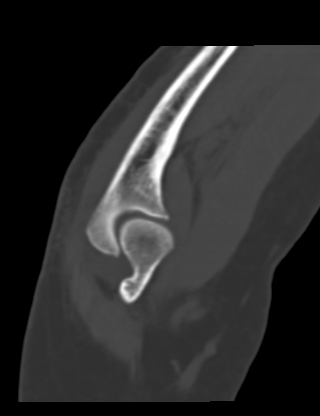

[16 of 33 positions shown; findings below may reference images not displayed]

FINDINGS: Elbow effusion is confirmed.  On image 62 of series 802,
a tiny pinhole lucency in the proximal cortical of articular
surface of the radius is noted but probably represents a tiny geode
given that no other cortical discontinuity is observed to suggest a
fracture.

There is spurring of the coronoid process, and indistinct
calcification along the proximal most aspects of the common
extensor, flexor tendons are noted, suggesting mild calcific
tendinopathy.

There is indistinct lucency dorsally along the lateral humeral
condyle shown on image 44 series 802, with indistinct lucency of
the trabecula this vicinity.  This could be from an erosion or mild
impaction with subsequent hyperemia causing the trabecular lucency.

The distal biceps tendon is well-shown attaching to the bicipital
tuberosity on image 87 of series 802.  There is mild spurring at
the triceps tendon insertion site with low-level overlying
subcutaneous edema.  No impinging lesion along the path of the
visualized ulnar nerve is observed.
IMPRESSION: 1.  Indistinct lucency along the dorsal portion of the lateral
humeral condyle with adjacent trabecular lucency could reflect
either an erosion, or prior impaction injury with hyperemia
resulting in the trabecular lucency.  A distinct fracture is not
visualized.
2.  Elbow joint effusion.
3.  Calcifications at to tendon attachment sites including the
triceps, common extensor tendon, and common flexor tendon
attachment sites.
4.  Pinhole lucency in the distal articular surface of the radius,
probably a tiny geode.

## 2011-12-06 ENCOUNTER — Ambulatory Visit (INDEPENDENT_AMBULATORY_CARE_PROVIDER_SITE_OTHER): Payer: Medicare Other | Admitting: Family Medicine

## 2011-12-06 ENCOUNTER — Encounter: Payer: Self-pay | Admitting: Family Medicine

## 2011-12-06 VITALS — BP 181/76 | HR 79 | Ht 64.0 in | Wt 119.0 lb

## 2011-12-06 DIAGNOSIS — M79609 Pain in unspecified limb: Secondary | ICD-10-CM

## 2011-12-06 DIAGNOSIS — M79601 Pain in right arm: Secondary | ICD-10-CM

## 2011-12-06 DIAGNOSIS — I10 Essential (primary) hypertension: Secondary | ICD-10-CM | POA: Diagnosis not present

## 2011-12-06 MED ORDER — CARVEDILOL 25 MG PO TABS: 25.0000 mg | ORAL_TABLET | Freq: Two times a day (BID) | ORAL | Status: DC

## 2011-12-06 NOTE — Assessment & Plan Note (Signed)
Likely due to gout. Resolved with prednisone. Plan for watchful waiting as her uric acid level was 5.8. Will follow expectantly.

## 2011-12-06 NOTE — Patient Instructions (Signed)
Thank you for coming in today. Let me know if that hand starts hurting again.  STOP your Hydrochlorothiazide.  Continue the losartan/Hydrochlorathiazide  Increase your coreg (carvidiol) to 25mg  twice a day.  See me in 1 month.  Let me know if you get dizzy or light headed.

## 2011-12-06 NOTE — Progress Notes (Signed)
[name] is a 78 y.o. female who presents to [facility] today for   1) Follow up right hand and wrist pain. Initially developed pain around 1-2 months ago. It was initially treat as cellulitis. However as that did not successfully alleviateed her symptoms a diagnosis of gout arthritis was made. She was treated with prednisone at the last visit. Prednisone quickly reduced her pain to its mild baseline. She is currently feeling well. Her pain is minimal and her motion is normal. She denies any fevers and chills.   2) Blood Pressure. Currently taking Losartan/HCTZ, Coreg and Amlodipine. She did not stop taking her HCTZ at the last visit. She denies any chest pains, palpitations, DOE, or syncope. She feels well otherwise.   PMH reviewed.  ROS as above otherwise neg Medications reviewed. Current Outpatient Prescriptions  Medication Sig Dispense Refill  . acetaminophen (TYLENOL) 500 MG tablet Take 1,000 mg by mouth every 6 (six) hours as needed. For pain       . albuterol (PROVENTIL HFA;VENTOLIN HFA) 108 (90 BASE) MCG/ACT inhaler Inhale 2 puffs into the lungs every 6 (six) hours as needed. For shortness of breath  1 Inhaler  6  . amLODipine (NORVASC) 10 MG tablet Take 10 mg by mouth daily.       Marland Kitchen aspirin (ASPIRIN CHILDRENS) 81 MG chewable tablet Chew 81 mg by mouth daily.        . carvedilol (COREG) 25 MG tablet Take 1 tablet (25 mg total) by mouth 2 (two) times daily.  60 tablet  3  . docusate sodium (COLACE) 100 MG capsule Take 100 mg by mouth 2 (two) times daily. For stool softner       . fluticasone (FLONASE) 50 MCG/ACT nasal spray Place 2 sprays into the nose daily as needed. For nasal congestion      . Fluticasone Propionate, Inhal, (FLOVENT DISKUS) 100 MCG/BLIST AEPB Inhale 1 puff into the lungs 2 (two) times daily.  60 each  6  . glipiZIDE (GLUCOTROL) 5 MG tablet Take 1 tablet (5 mg total) by mouth 2 (two) times daily before a meal.  60 tablet  6  . losartan-hydrochlorothiazide (HYZAAR) 50-12.5  MG per tablet TAKE 1 TABLET BY MOUTH DAILY.  30 tablet  2  . metFORMIN (GLUCOPHAGE) 500 MG tablet Take 1 tablet (500 mg total) by mouth 2 (two) times daily with a meal.  60 tablet  11  . PATADAY 0.2 % SOLN Place 1 drop into both eyes daily as needed. For itchy eyes      . polyethylene glycol (MIRALAX) powder Take 17 g by mouth daily as needed. For constipation. Mix in 8 oz glass of water.      . ranitidine (ZANTAC) 150 MG capsule Take 1 capsule (150 mg total) by mouth 2 (two) times daily.  60 capsule  6  . rosuvastatin (CRESTOR) 20 MG tablet Take 20 mg by mouth at bedtime.       . tamoxifen (NOLVADEX) 20 MG tablet Take 1 tablet (20 mg total) by mouth daily.  90 tablet  3  . DISCONTD: carvedilol (COREG) 12.5 MG tablet Take 12.5 mg by mouth 2 (two) times daily.        Marland Kitchen HYDROcodone-acetaminophen (VICODIN) 5-500 MG per tablet Take 1 tablet by mouth every 8 (eight) hours as needed for pain.  35 tablet  1    Exam:  BP 181/76  Pulse 79  Ht 5\' 4"  (1.626 m)  Wt 119 lb (53.978 kg)  BMI  20.43 kg/m2 Gen: Well NAD HEENT: EOMI,  MMM Lungs: CTABL Nl WOB Heart: RRR no MRG Abd: NABS, NT, ND MSK: Right hand normal appearing. Non tender. Normal motion.

## 2011-12-06 NOTE — Assessment & Plan Note (Signed)
Not well controlled and accidentally on HCTZ + Combo pill with ARB and HCTZ.  Plan to increase coreg to 25mg  bid as her HR can handle it. Will recheck in 1 month. Warned about hypotension signs and symptoms.

## 2011-12-09 IMAGING — CR DG CHEST 2V
1 series · 1 of 1 positions shown · non-contrast
Comparison: 08/24/2011

CLINICAL DATA: Shortness of breath.

CHEST - 2 VIEW

[w chest lat]
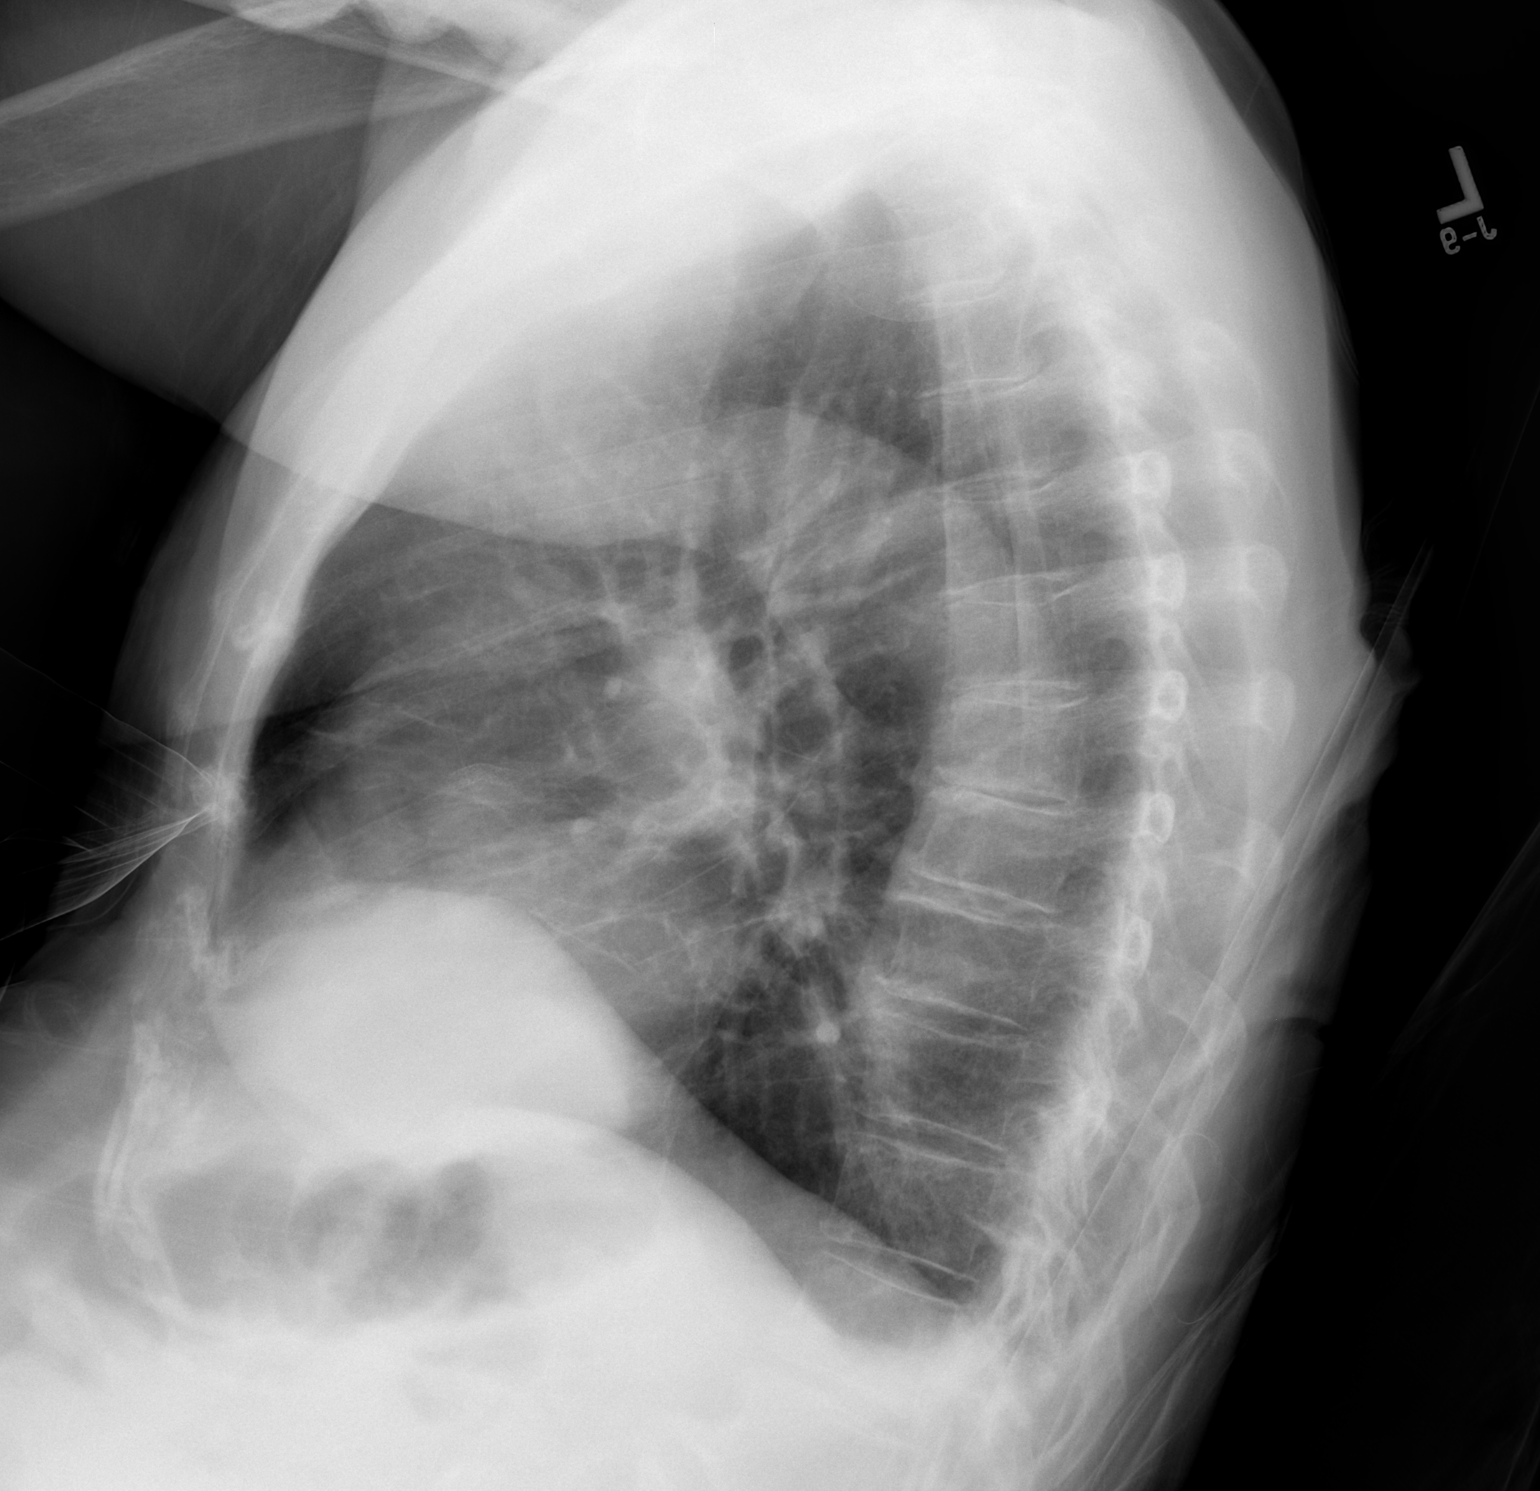

[1 of 1 positions shown; findings below may reference images not displayed]

FINDINGS: Lateral view degraded by patient arm position.

Midline trachea.  Mild cardiomegaly with atherosclerosis in the
transverse aorta.  No definite pleural fluid. No congestive
failure.

The right hilum appears prominent.  This is favored to be
projectional.  Mild scarring at the left lung base.
IMPRESSION: Mild cardiomegaly, without acute disease.

Apparent right hilar soft tissue fullness is favored to be
projectional.  If there is concern of adenopathy, consider CT.

## 2011-12-12 ENCOUNTER — Ambulatory Visit (HOSPITAL_BASED_OUTPATIENT_CLINIC_OR_DEPARTMENT_OTHER): Payer: Medicare Other | Admitting: Hematology and Oncology

## 2011-12-12 ENCOUNTER — Other Ambulatory Visit: Payer: Self-pay | Admitting: Hematology and Oncology

## 2011-12-12 ENCOUNTER — Other Ambulatory Visit (HOSPITAL_BASED_OUTPATIENT_CLINIC_OR_DEPARTMENT_OTHER): Payer: Medicare Other | Admitting: Lab

## 2011-12-12 VITALS — BP 135/64 | HR 83 | Temp 98.9°F | Ht 64.0 in | Wt 117.7 lb

## 2011-12-12 DIAGNOSIS — C19 Malignant neoplasm of rectosigmoid junction: Secondary | ICD-10-CM

## 2011-12-12 DIAGNOSIS — C50919 Malignant neoplasm of unspecified site of unspecified female breast: Secondary | ICD-10-CM | POA: Diagnosis not present

## 2011-12-12 DIAGNOSIS — D509 Iron deficiency anemia, unspecified: Secondary | ICD-10-CM

## 2011-12-12 DIAGNOSIS — D649 Anemia, unspecified: Secondary | ICD-10-CM | POA: Diagnosis not present

## 2011-12-12 LAB — CBC WITH DIFFERENTIAL/PLATELET
BASO%: 0.8 % (ref 0.0–2.0)
EOS%: 1.8 % (ref 0.0–7.0)
HGB: 9.6 g/dL — ABNORMAL LOW (ref 11.6–15.9)
MCH: 22.7 pg — ABNORMAL LOW (ref 25.1–34.0)
MCHC: 32 g/dL (ref 31.5–36.0)
RDW: 17.3 % — ABNORMAL HIGH (ref 11.2–14.5)
lymph#: 0.9 10*3/uL (ref 0.9–3.3)

## 2011-12-12 LAB — COMPREHENSIVE METABOLIC PANEL
AST: 11 U/L (ref 0–37)
Alkaline Phosphatase: 35 U/L — ABNORMAL LOW (ref 39–117)
BUN: 14 mg/dL (ref 6–23)
Creatinine, Ser: 0.84 mg/dL (ref 0.50–1.10)
Glucose, Bld: 192 mg/dL — ABNORMAL HIGH (ref 70–99)
Total Bilirubin: 0.2 mg/dL — ABNORMAL LOW (ref 0.3–1.2)

## 2011-12-12 LAB — IRON AND TIBC
Iron: 60 ug/dL (ref 42–145)
TIBC: 235 ug/dL — ABNORMAL LOW (ref 250–470)
UIBC: 175 ug/dL (ref 125–400)

## 2011-12-12 LAB — FERRITIN: Ferritin: 493 ng/mL — ABNORMAL HIGH (ref 10–291)

## 2011-12-12 NOTE — Progress Notes (Signed)
This office note has been dictated.

## 2011-12-12 NOTE — Progress Notes (Signed)
CC:   [name] Graham, MD  IDENTIFYING STATEMENT:  The patient is an 78 year old woman with breast cancer who presents for followup.  HISTORY OF PRESENT ILLNESS:  The patient has no current concerns.  She is taking tamoxifen for history of breast cancer.  She is tolerating that well.  She denies GI symptoms.  She denies bone pain.  She has noted over the course of a few months swelling specifically involving the right upper extremity.  She has had this evaluated with x-rays with no abnormal findings.  She was told she may have arthritis versus gout. She takes occasional NSAIDs.  She is afebrile.  MEDICATIONS:  Reviewed and updated.  ALLERGIES:  Lisinopril, peanuts.  PHYSICAL EXAMINATION:  General:  Well-appearing, well-nourished woman in no distress.  Vitals:  Pulse 83, blood pressure 135/64, temperature 98.9, respirations 20, weight 117 pounds.  HEENT:  Head is atraumatic, normocephalic.  Sclerae anicteric.  Mouth moist.  Chest:  CVS unremarkable.  Breast Exam:  Notes well-healed bilateral mastectomy surgical scars.  There is no surrounding erythema, skin nodules or rash. Abdomen:  Soft, nontender, bowel sounds present.  Extremities:  Mild edema involving dorsum of right upper extremity.  LABORATORY DATA:  12/12/2011, white cell count 4.1, hemoglobin 9.6, hematocrit 32.2, platelets 239.  CMET, CA19-9 pending.  IMPRESSION AND PLAN: 1. Mrs. Hintze is an 78 year old woman status post bilateral simple     mastectomies with bilateral axillary lymph node dissection in 2011.     Right breast revealed T1c N0 and left T1c N1 with 1 of 1 positive     lymph node.  ER/PR were 100% and 27%, respectively, and HER2/neu     showed no evidence for amplification for FISH.  The patient began     tamoxifen on 11/18/2010.  She is thus far tolerating therapy with     very minimal difficulty.  She will continue.  She appears to have     lymphedema from her previous breast surgery.  She will be referred  to the lymphedema clinic. 2. Anemia.  Denies history of rectal bleeding.  We will check iron     stores.  We will call her with results.    She routinely follows up in 6 months' time.    ______________________________ Laurice Record, M.D. LIO/MEDQ  D:  12/12/2011  T:  12/12/2011  Job:  [id number]

## 2011-12-22 DIAGNOSIS — M79609 Pain in unspecified limb: Secondary | ICD-10-CM | POA: Diagnosis not present

## 2011-12-22 DIAGNOSIS — B351 Tinea unguium: Secondary | ICD-10-CM | POA: Diagnosis not present

## 2011-12-27 ENCOUNTER — Telehealth: Payer: Self-pay | Admitting: Hematology and Oncology

## 2011-12-27 NOTE — Telephone Encounter (Signed)
[name] at the [facility] [phone]) will call the pt with her appt.   aom

## 2012-01-07 IMAGING — CR DG HAND COMPLETE 3+V*R*
3 series · 3 of 3 positions shown · non-contrast
Comparison: Right hand films of 10/12/2011

CLINICAL DATA: Hand and wrist pain for 1 month

RIGHT HAND - COMPLETE 3+ VIEW

[view not recorded (1 of 3)]
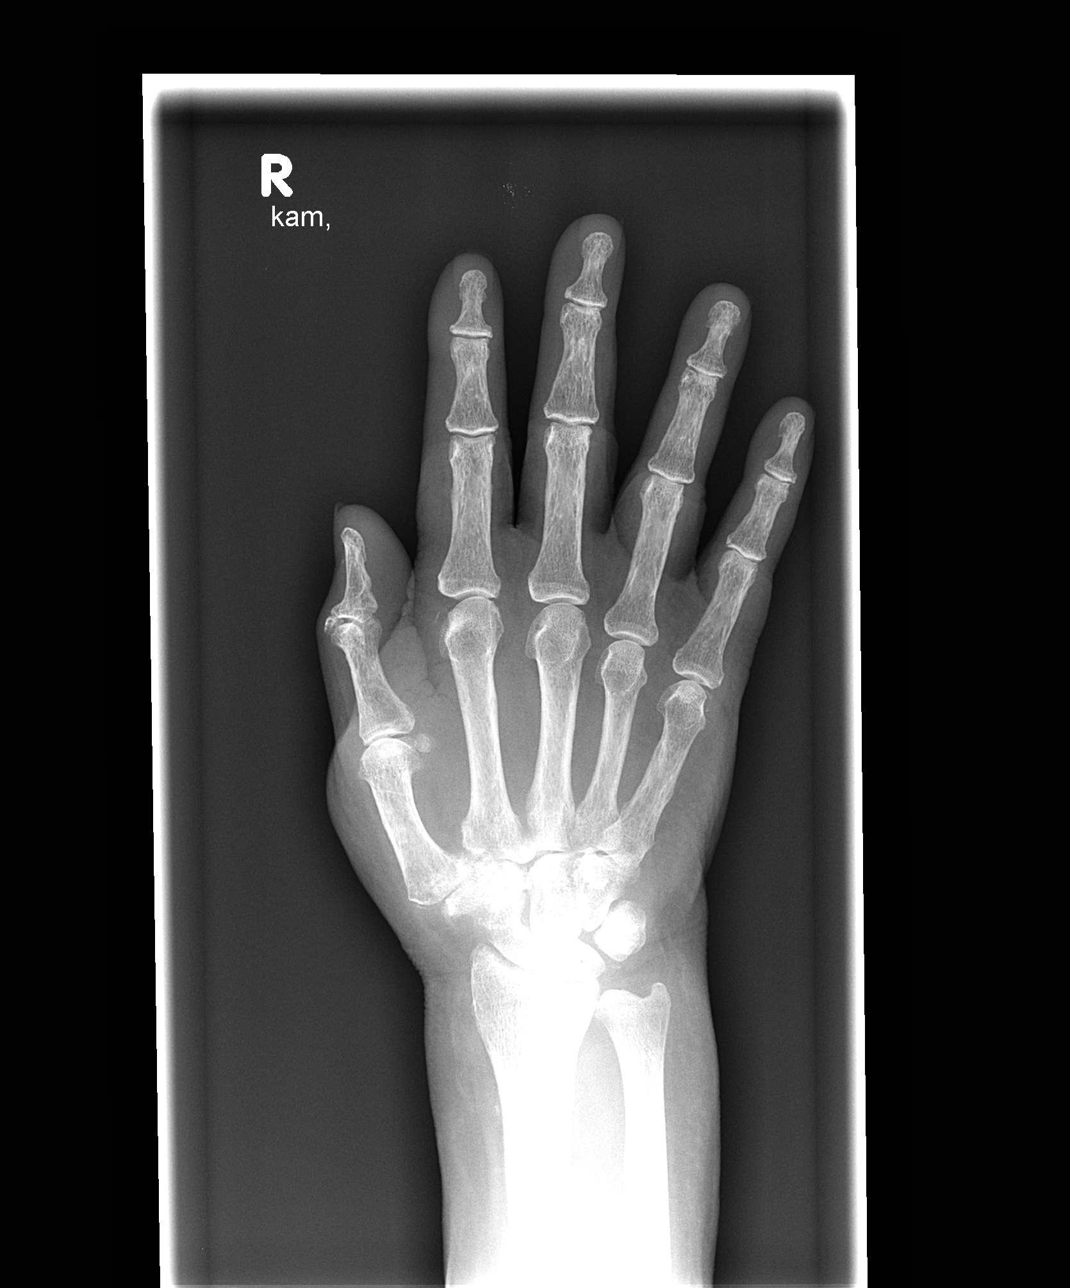

[view not recorded (2 of 3)]
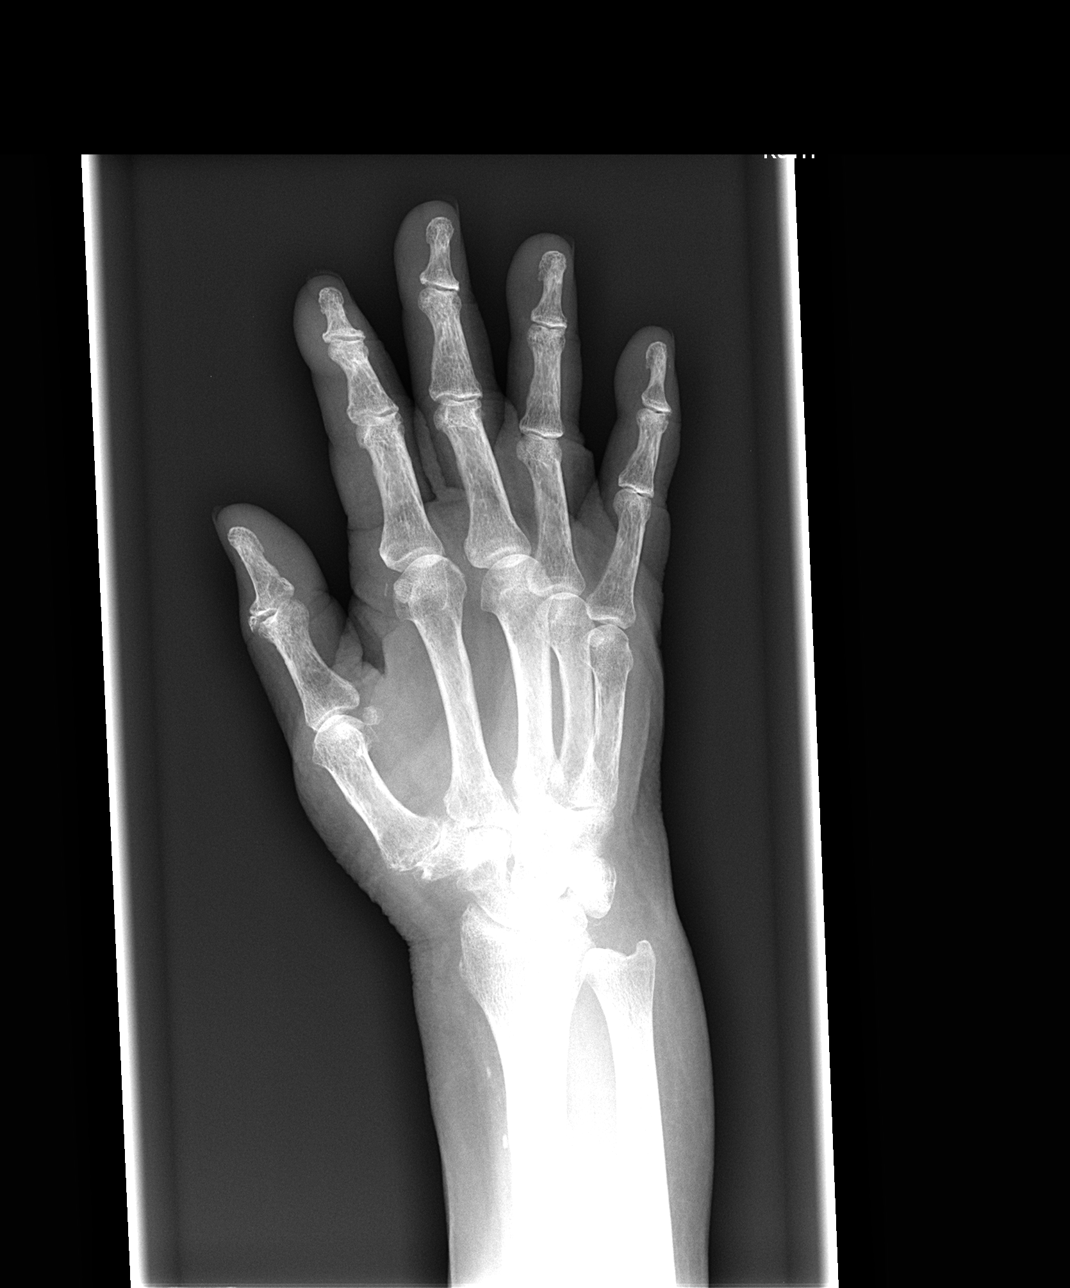

[view not recorded (3 of 3)]
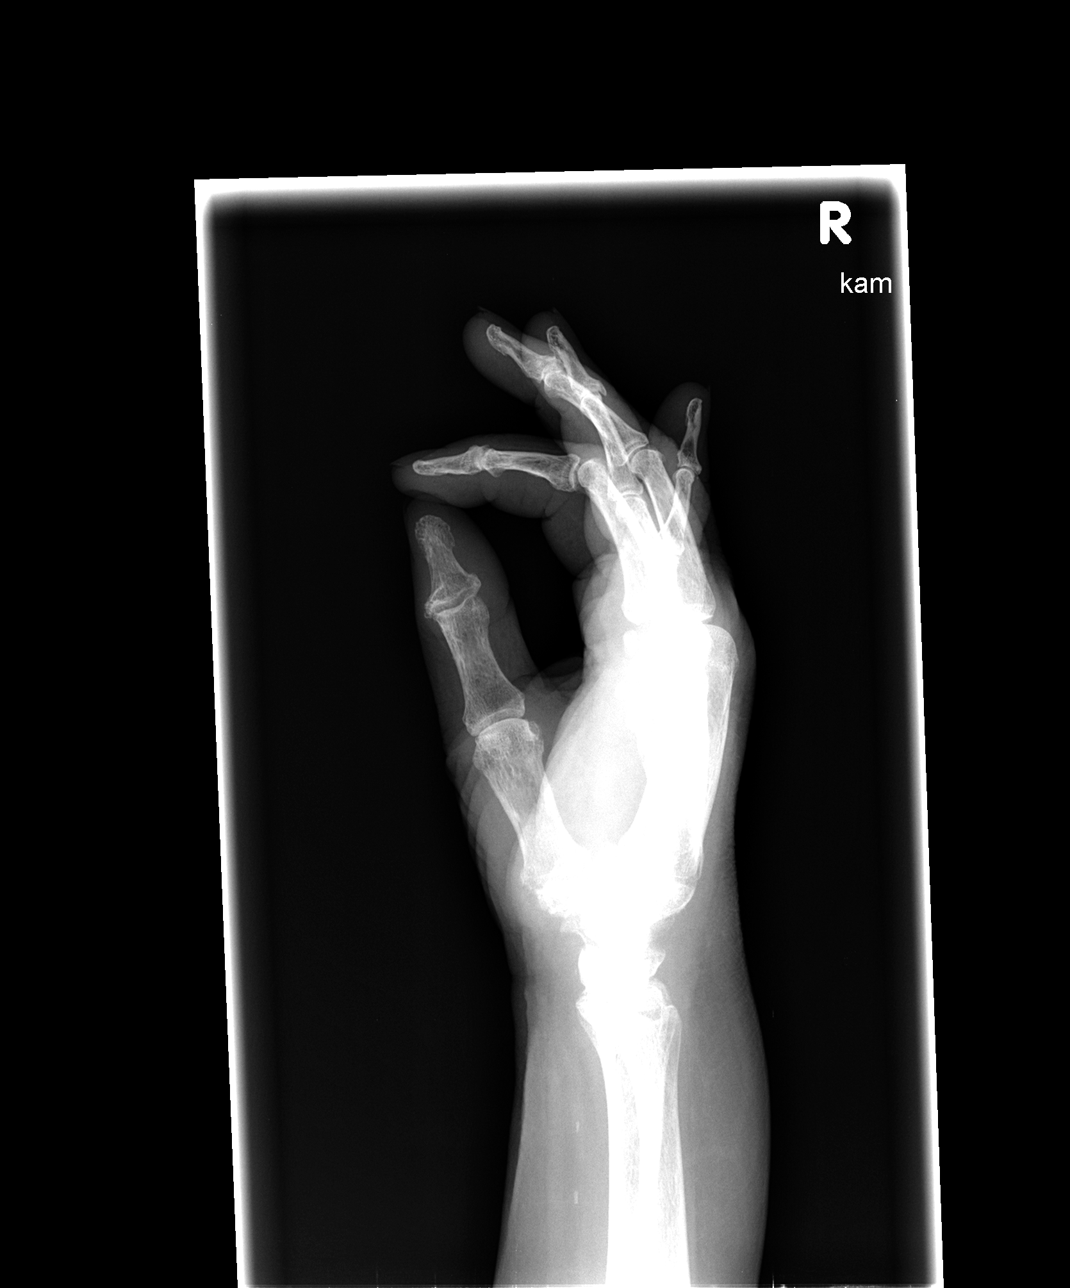

[3 of 3 positions shown; findings below may reference images not displayed]

FINDINGS: The bones are diffusely osteopenic.  There is some
degenerative change involving the right first MCP joint and DIP
joint.  Mild degenerative change is noted in the remainder of the
DIP joints diffusely.

There is considerable degenerative change noted at the articulation
of the base of the first metacarpal with the trapezium as well as
at the articulation of the navicular with the trapezium.  The
margins of the trapezium are less well defined and erosions cannot
be excluded as with gout which is questioned clinically.  The
radiocarpal joint space appears stable.  There is only faint
chondrocalcinosis noted.  No acute abnormality is seen.  Alignment
is normal.
IMPRESSION: Significant degenerative change along the radial aspect of the
wrist.  The loss of cortical margin of the trapezium may be due to
erosions as with gout which is questioned clinically.

## 2012-01-07 IMAGING — CR DG WRIST COMPLETE 3+V*R*
3 series · 3 of 3 positions shown · non-contrast
Comparison: Right wrist films of10/12/2011

CLINICAL DATA: Hand and wrist pain, no acute injury, question gout

RIGHT WRIST - COMPLETE 3+ VIEW

[view not recorded (1 of 3)]
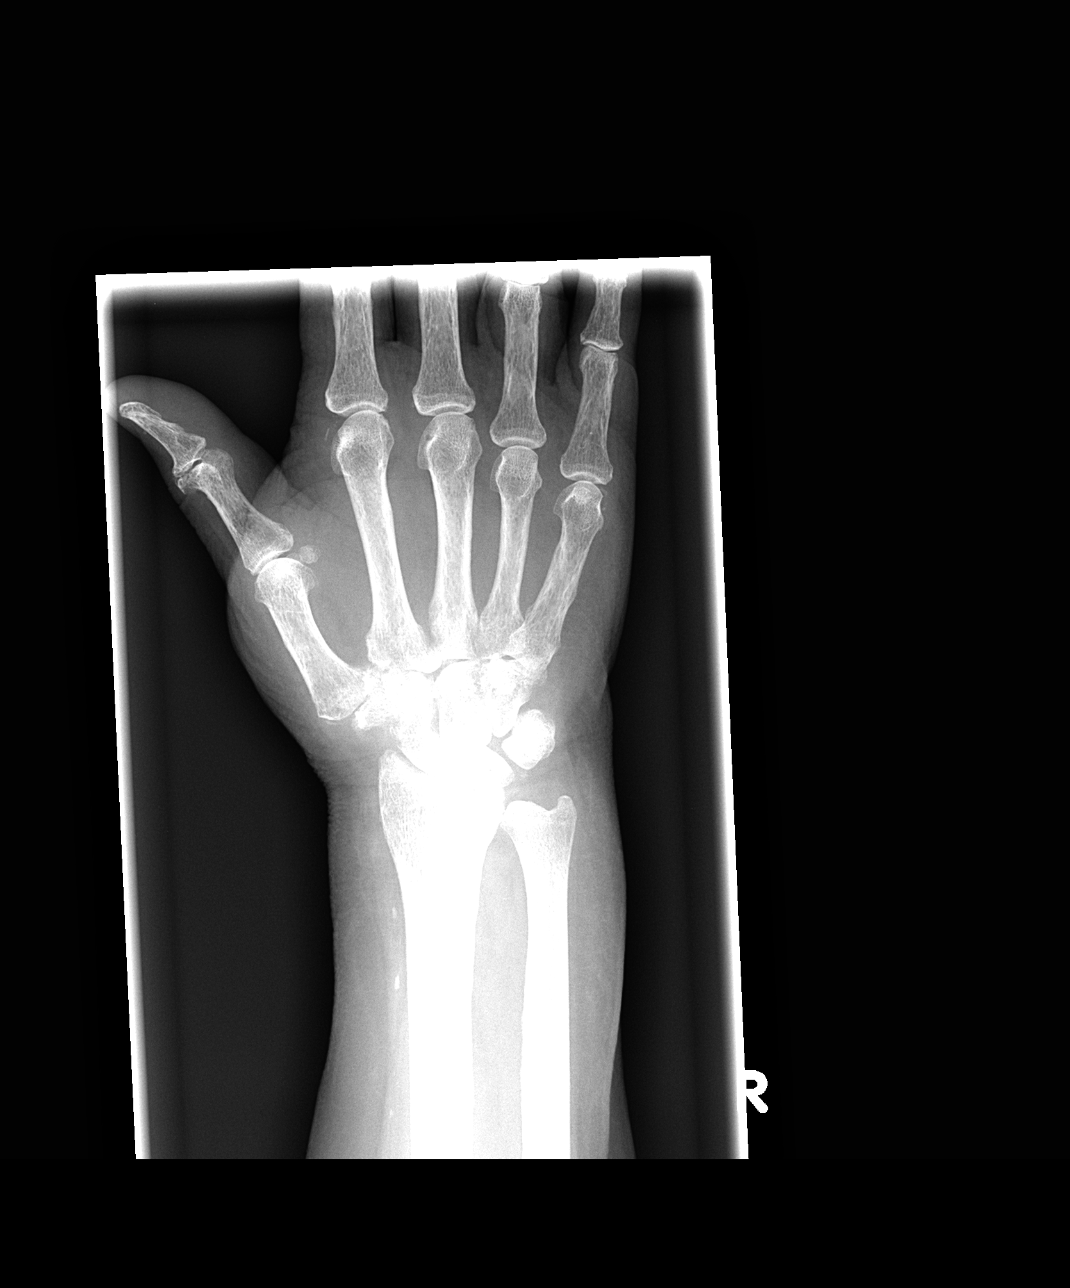

[view not recorded (2 of 3)]
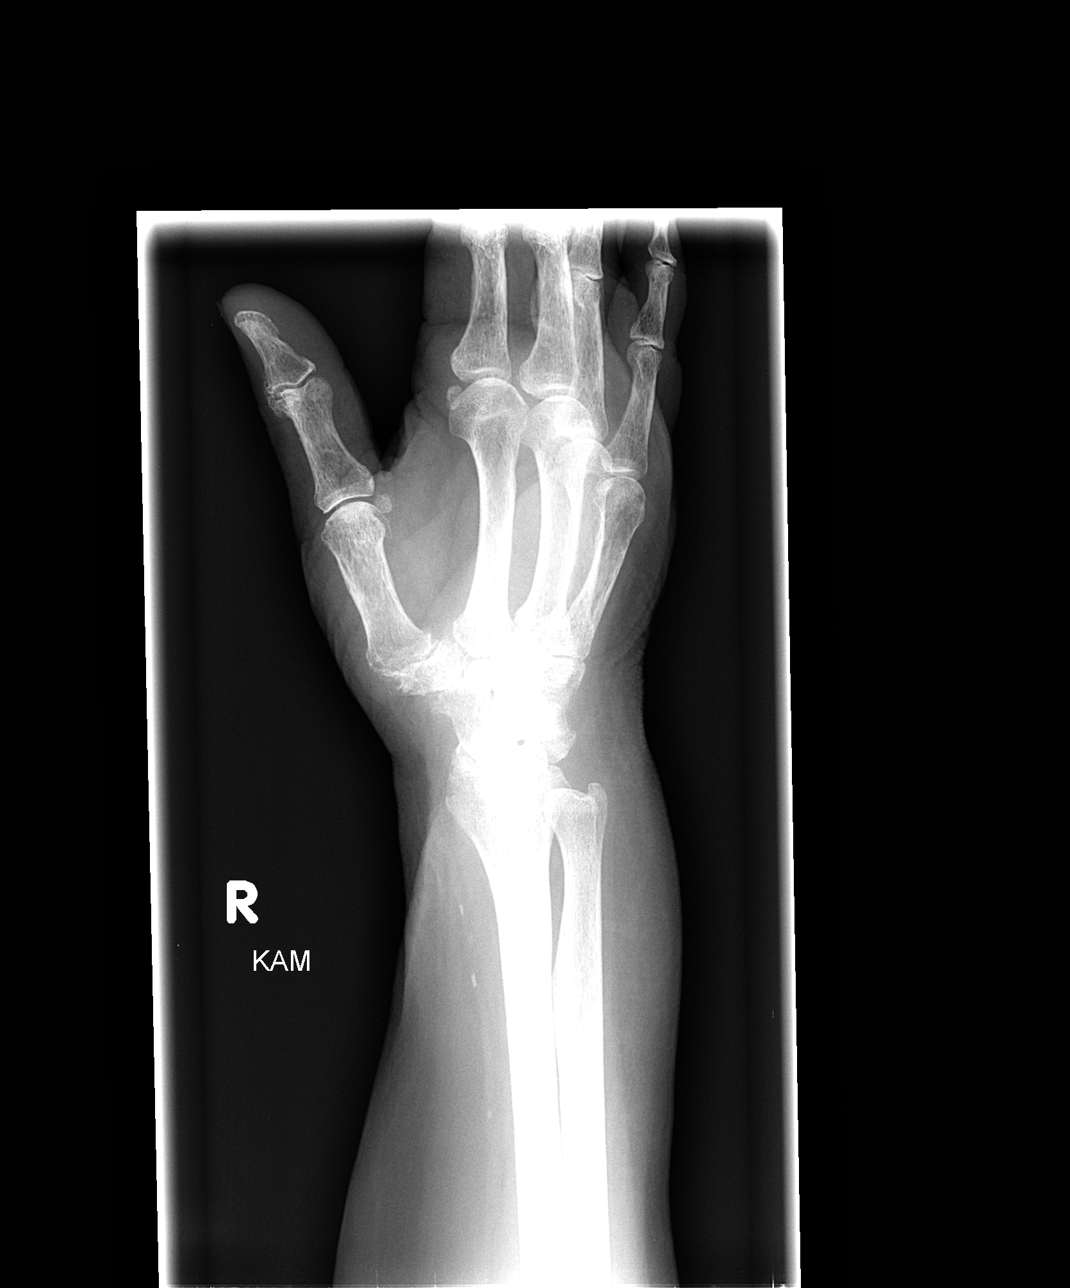

[view not recorded (3 of 3)]
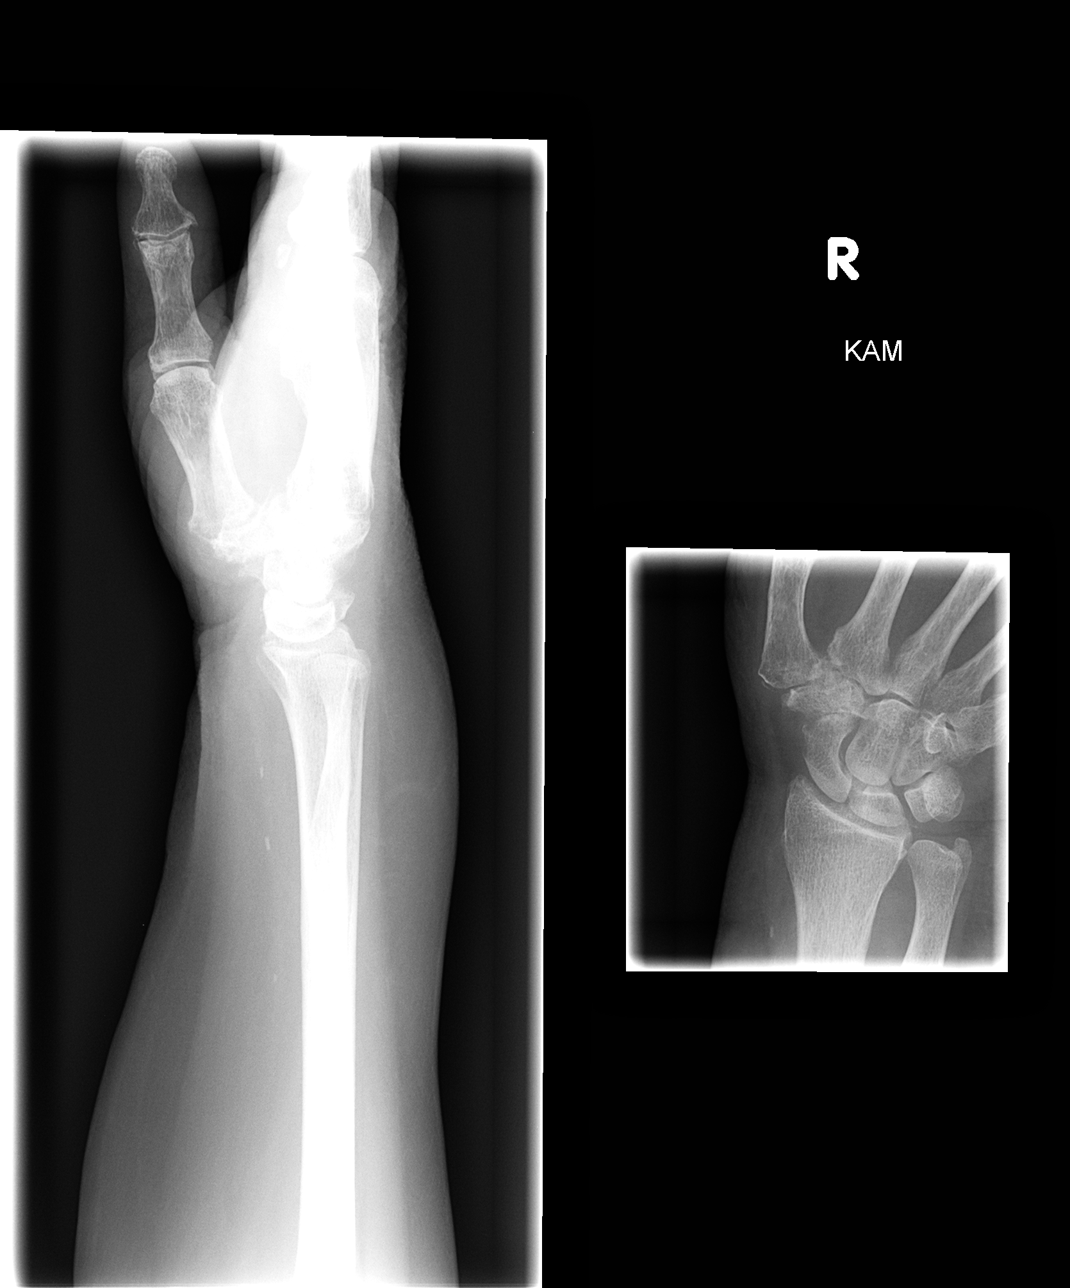

[3 of 3 positions shown; findings below may reference images not displayed]

FINDINGS: The radiocarpal joint space appears normal.  Only faint
chondrocalcinosis is present.  There is degenerative change at the
articulation of the base of the first metacarpal with the
trapezium.  There is also loss of intercarpal space at the
articulation of the trapezium with the navicula with loss of
cortical margin of the trapezium.  Gouty erosions cannot be
excluded involving the trapezium.  The remainder of intercarpal
spaces appear normal.  The ulnar styloid is intact.
IMPRESSION: Significant degenerative change along the radial aspect of the
wrist particularly involving the trapezium.  The loss of cortical
margin of the trapezium may be due to erosions as with gout.

## 2012-01-11 ENCOUNTER — Ambulatory Visit: Payer: Medicare Other | Admitting: Family Medicine

## 2012-01-17 ENCOUNTER — Ambulatory Visit: Payer: Medicare Other | Admitting: Physical Therapy

## 2012-01-18 ENCOUNTER — Other Ambulatory Visit: Payer: Self-pay | Admitting: Family Medicine

## 2012-01-22 ENCOUNTER — Encounter: Payer: Self-pay | Admitting: Family Medicine

## 2012-01-22 ENCOUNTER — Ambulatory Visit (INDEPENDENT_AMBULATORY_CARE_PROVIDER_SITE_OTHER): Payer: Medicare Other | Admitting: Family Medicine

## 2012-01-22 VITALS — BP 174/75 | HR 76 | Ht <= 58 in | Wt 118.0 lb

## 2012-01-22 DIAGNOSIS — M79629 Pain in unspecified upper arm: Secondary | ICD-10-CM

## 2012-01-22 DIAGNOSIS — E119 Type 2 diabetes mellitus without complications: Secondary | ICD-10-CM

## 2012-01-22 DIAGNOSIS — I10 Essential (primary) hypertension: Secondary | ICD-10-CM | POA: Diagnosis not present

## 2012-01-22 DIAGNOSIS — M79609 Pain in unspecified limb: Secondary | ICD-10-CM

## 2012-01-22 DIAGNOSIS — M79641 Pain in right hand: Secondary | ICD-10-CM

## 2012-01-22 MED ORDER — LOSARTAN POTASSIUM-HCTZ 50-12.5 MG PO TABS: 1.0000 | ORAL_TABLET | Freq: Every day | ORAL | Status: DC

## 2012-01-22 MED ORDER — TRAMADOL HCL 50 MG PO TABS: 50.0000 mg | ORAL_TABLET | Freq: Four times a day (QID) | ORAL | Status: DC | PRN

## 2012-01-22 NOTE — Assessment & Plan Note (Signed)
Blood pressure elevated today. The patient is not taking her medications correctly.  Encouraged her to stop hydrochlorothiazide and start Hyzaar.  Wrote on all of her pill bottles with the medicine is for to avoid confusion.

## 2012-01-22 NOTE — Patient Instructions (Signed)
Thank you for coming in today. STOP hydrochlorothiazide. Start: Hyzaar. (Losartan hydrochlorothiazide). Use tramadol for pain. See me in one month.

## 2012-01-22 NOTE — Assessment & Plan Note (Signed)
Diabetes well controlled today with a hemoglobin A1c of  Lab Results  Component Value Date   HGBA1C 7.3 01/22/2012   No changes planned

## 2012-01-22 NOTE — Assessment & Plan Note (Signed)
Likely arthritis versus gout.  Plan to prescribe tramadol and followup in one month.  There is likely not much we can do her aside from controlling pain.

## 2012-01-22 NOTE — Progress Notes (Signed)
[name] is a 77 y.o. female who presents to [facility] today for   1) right hand pain: Present now for several months waxes and wanes.  Currently notes pain at the base of the right thumb especially with motion.  Pain became severe enough last night that she had to take some leftover oxycodone.  Tylenol does not sufficiently control her pain.  She remains in good spirits about her pain, but would like to have less overall daily pain.  2) hypertension: Brings in a bag of pills in today.  Has a bottle of hydrochlorothiazide that she continues to take. Does not have Hyzaar.  Feels well without any chest pain palpitations syncope or lower extremity edema.  3) diabetes: Takes medication listed below. Blood sugars are well controlled at home without any hypo-or hyperglycemic episodes.  Feels well   PMH reviewed. Significant for breast cancer status post mastectomy on tamoxifen.  Also significant for arthritis diabetes and hypertension ROS as above otherwise neg Medications reviewed. Current Outpatient Prescriptions  Medication Sig Dispense Refill  . acetaminophen (TYLENOL) 500 MG tablet Take 1,000 mg by mouth every 6 (six) hours as needed. For pain       . albuterol (PROVENTIL HFA;VENTOLIN HFA) 108 (90 BASE) MCG/ACT inhaler Inhale 2 puffs into the lungs every 6 (six) hours as needed. For shortness of breath  1 Inhaler  6  . amLODipine (NORVASC) 10 MG tablet Take 10 mg by mouth daily.       Marland Kitchen aspirin (ASPIRIN CHILDRENS) 81 MG chewable tablet Chew 81 mg by mouth daily.        . carvedilol (COREG) 25 MG tablet Take 1 tablet (25 mg total) by mouth 2 (two) times daily.  60 tablet  3  . fluticasone (FLONASE) 50 MCG/ACT nasal spray Place 2 sprays into the nose daily as needed. For nasal congestion      . Fluticasone Propionate, Inhal, (FLOVENT DISKUS) 100 MCG/BLIST AEPB Inhale 1 puff into the lungs 2 (two) times daily.  60 each  6  . glipiZIDE (GLUCOTROL) 5 MG tablet Take 1 tablet (5 mg total) by mouth 2  (two) times daily before a meal.  60 tablet  6  . metFORMIN (GLUCOPHAGE) 500 MG tablet Take 1 tablet (500 mg total) by mouth 2 (two) times daily with a meal.  60 tablet  11  . PATADAY 0.2 % SOLN Place 1 drop into both eyes daily as needed. For itchy eyes      . polyethylene glycol (MIRALAX) powder Take 17 g by mouth daily as needed. For constipation. Mix in 8 oz glass of water.      . ranitidine (ZANTAC) 150 MG capsule Take 1 capsule (150 mg total) by mouth 2 (two) times daily.  60 capsule  6  . rosuvastatin (CRESTOR) 20 MG tablet Take 20 mg by mouth at bedtime.       . tamoxifen (NOLVADEX) 20 MG tablet Take 1 tablet (20 mg total) by mouth daily.  90 tablet  3  . losartan-hydrochlorothiazide (HYZAAR) 50-12.5 MG per tablet Take 1 tablet by mouth daily.  30 tablet  4  . traMADol (ULTRAM) 50 MG tablet Take 1 tablet (50 mg total) by mouth every 6 (six) hours as needed for pain. Maximum dose= 8 tablets per day  90 tablet  2  . DISCONTD: losartan-hydrochlorothiazide (HYZAAR) 50-12.5 MG per tablet TAKE 1 TABLET BY MOUTH DAILY.  30 tablet  2  . DISCONTD: traMADol (ULTRAM) 50 MG tablet Take 1  tablet (50 mg total) by mouth every 6 (six) hours as needed for pain. Maximum dose= 8 tablets per day  30 tablet  1    Exam:  BP 174/75  Pulse 76  Ht 4\' 10"  (1.473 m)  Wt 118 lb (53.524 kg)  BMI 24.66 kg/m2 Gen: Well NAD HEENT: EOMI,  MMM Lungs: CTABL Nl WOB Heart: RRR no MRG Abd: NABS, NT, ND Exts: Non edematous BL  LE, warm and well perfused.  MSK: Right upper extremity mildly edematous.  Tender to palpation in right anatomical snuff box and metacarpophalangeal joint of right thumb.  Normal motion.

## 2012-01-24 ENCOUNTER — Ambulatory Visit: Payer: Medicare Other | Attending: Hematology and Oncology | Admitting: Physical Therapy

## 2012-01-24 DIAGNOSIS — IMO0001 Reserved for inherently not codable concepts without codable children: Secondary | ICD-10-CM | POA: Diagnosis not present

## 2012-01-24 DIAGNOSIS — M79609 Pain in unspecified limb: Secondary | ICD-10-CM | POA: Insufficient documentation

## 2012-01-24 DIAGNOSIS — M2560 Stiffness of unspecified joint, not elsewhere classified: Secondary | ICD-10-CM | POA: Insufficient documentation

## 2012-01-24 DIAGNOSIS — R609 Edema, unspecified: Secondary | ICD-10-CM | POA: Insufficient documentation

## 2012-02-05 ENCOUNTER — Encounter: Payer: Medicare Other | Admitting: Physical Therapy

## 2012-02-07 ENCOUNTER — Other Ambulatory Visit: Payer: Self-pay | Admitting: Family Medicine

## 2012-02-14 ENCOUNTER — Encounter: Payer: Self-pay | Admitting: Family Medicine

## 2012-02-14 ENCOUNTER — Ambulatory Visit (INDEPENDENT_AMBULATORY_CARE_PROVIDER_SITE_OTHER): Payer: Medicare Other | Admitting: Family Medicine

## 2012-02-14 VITALS — BP 136/69 | HR 71 | Temp 98.1°F

## 2012-02-14 DIAGNOSIS — M109 Gout, unspecified: Secondary | ICD-10-CM | POA: Diagnosis not present

## 2012-02-14 MED ORDER — COLCHICINE 0.6 MG PO TABS: ORAL_TABLET | ORAL | Status: DC

## 2012-02-14 MED ORDER — OXYCODONE-ACETAMINOPHEN 10-325 MG PO TABS: 1.0000 | ORAL_TABLET | Freq: Four times a day (QID) | ORAL | Status: AC | PRN

## 2012-02-14 NOTE — Patient Instructions (Signed)
Take Colchicine 2 tablets by mouth once, then 1 tablet one hour later. Take Percocet 1 tablet every 6 hours as needed for pain x 1 week. If symptoms do not improve, return to clinic or call MD.  Gout Gout is an inflammatory condition (arthritis) caused by a buildup of uric acid crystals in the joints. Uric acid is a chemical that is normally present in the blood. Under some circumstances, uric acid can form into crystals in your joints. This causes joint redness, soreness, and swelling (inflammation). Repeat attacks are common. Over time, uric acid crystals can form into masses (tophi) near a joint, causing disfigurement. Gout is treatable and often preventable. CAUSES  The disease begins with elevated levels of uric acid in the blood. Uric acid is produced by your body when it breaks down a naturally found substance called purines. This also happens when you eat certain foods such as meats and fish. Causes of an elevated uric acid level include:  Being passed down from parent to child (heredity).   Diseases that cause increased uric acid production (obesity, psoriasis, some cancers).   Excessive alcohol use.   Diet, especially diets rich in meat and seafood.   Medicines, including certain cancer-fighting drugs (chemotherapy), diuretics, and aspirin.   Chronic kidney disease. The kidneys are no longer able to remove uric acid well.   Problems with metabolism.  Conditions strongly associated with gout include:  Obesity.   High blood pressure.   High cholesterol.   Diabetes.  Not everyone with elevated uric acid levels gets gout. It is not understood why some people get gout and others do not. Surgery, joint injury, and eating too much of certain foods are some of the factors that can lead to gout. SYMPTOMS   An attack of gout comes on quickly. It causes intense pain with redness, swelling, and warmth in a joint.   Fever can occur.   Often, only one joint is involved. Certain  joints are more commonly involved:   Base of the big toe.   Knee.   Ankle.   Wrist.   Finger.  Without treatment, an attack usually goes away in a few days to weeks. Between attacks, you usually will not have symptoms, which is different from many other forms of arthritis. DIAGNOSIS  Your caregiver will suspect gout based on your symptoms and exam. Removal of fluid from the joint (arthrocentesis) is done to check for uric acid crystals. Your caregiver will give you a medicine that numbs the area (local anesthetic) and use a needle to remove joint fluid for exam. Gout is confirmed when uric acid crystals are seen in joint fluid, using a special microscope. Sometimes, blood, urine, and X-ray tests are also used. TREATMENT  There are 2 phases to gout treatment: treating the sudden onset (acute) attack and preventing attacks (prophylaxis). Treatment of an Acute Attack  Medicines are used. These include anti-inflammatory medicines or steroid medicines.   An injection of steroid medicine into the affected joint is sometimes necessary.   The painful joint is rested. Movement can worsen the arthritis.   You may use warm or cold treatments on painful joints, depending which works best for you.   Discuss the use of coffee, vitamin C, or cherries with your caregiver. These may be helpful treatment options.  Treatment to Prevent Attacks After the acute attack subsides, your caregiver may advise prophylactic medicine. These medicines either help your kidneys eliminate uric acid from your body or decrease your uric acid production.  You may need to stay on these medicines for a very long time. The early phase of treatment with prophylactic medicine can be associated with an increase in acute gout attacks. For this reason, during the first few months of treatment, your caregiver may also advise you to take medicines usually used for acute gout treatment. Be sure you understand your caregiver's  directions. You should also discuss dietary treatment with your caregiver. Certain foods such as meats and fish can increase uric acid levels. Other foods such as dairy can decrease levels. Your caregiver can give you a list of foods to avoid. HOME CARE INSTRUCTIONS   Do not take aspirin to relieve pain. This raises uric acid levels.   Only take over-the-counter or prescription medicines for pain, discomfort, or fever as directed by your caregiver.   Rest the joint as much as possible. When in bed, keep sheets and blankets off painful areas.   Keep the affected joint raised (elevated).   Use crutches if the painful joint is in your leg.   Drink enough water and fluids to keep your urine clear or pale yellow. This helps your body get rid of uric acid. Do not drink alcoholic beverages. They slow the passage of uric acid.   Follow your caregiver's dietary instructions. Pay careful attention to the amount of protein you eat. Your daily diet should emphasize fruits, vegetables, whole grains, and fat-free or low-fat milk products.   Maintain a healthy body weight.  SEEK MEDICAL CARE IF:   You have an oral temperature above 102 F (38.9 C).   You develop diarrhea, vomiting, or any side effects from medicines.   You do not feel better in 24 hours, or you are getting worse.  SEEK IMMEDIATE MEDICAL CARE IF:   Your joint becomes suddenly more tender and you have:   Chills.   An oral temperature above 102 F (38.9 C), not controlled by medicine.  MAKE SURE YOU:   Understand these instructions.   Will watch your condition.   Will get help right away if you are not doing well or get worse.  Document Released: 10/27/2000 Document Revised: 10/19/2011 Document Reviewed: 02/07/2010 [facility] Patient Information 2012 [place], [place].

## 2012-02-14 NOTE — Assessment & Plan Note (Addendum)
Patient says she has had gout attacks in the past.   No evidence of acute injury or fracture.  Afebrile and no systemic symptoms concerning for cellulitis. Will treat with Colchicine 1.2 mg x 1, then 0.6 mg one hour later. For pain, will give 7 day course of Percocet. Follow up as needed if symptoms worsen.

## 2012-02-14 NOTE — Progress Notes (Signed)
  Subjective:    Patient ID: [name], female    DOB: 1924/04/01, 78 y.o.   MRN: [id number]  HPI  Patient presents to same day appointment for LT foot pain and swelling.  Symptoms started 2 days ago.  Patient says pain and swelling have become worse and now she cannot stand on LT foot.  She has not taken any medication yet.  She says she has had a gout flare before and went to ED.  Patient denies any injury or trauma to LT foot or ankle.  Denies any recent fall.    Patient denies any fever, but complains of chills.  Denies any SOB, nausea/vomiting, numbness/tingling of lower extremities.  Review of Systems  Per HPI    Objective:   Physical Exam  Constitutional: No distress.  Musculoskeletal:       Swollen, warm, redness of LT foot and ankle.  Moderate tenderness on palpation of ankle.  Patient cannot bear weight on left foot.  Diminished pulses due to edema.  Limited ROM.  No calf tenderness bilaterally.  Neurological: She is alert.  Skin:       Skin around left ankle is swollen and shiny.  No breakdown.           Assessment & Plan:

## 2012-02-16 ENCOUNTER — Other Ambulatory Visit: Payer: Self-pay | Admitting: Family Medicine

## 2012-02-16 DIAGNOSIS — I10 Essential (primary) hypertension: Secondary | ICD-10-CM

## 2012-02-16 MED ORDER — CARVEDILOL 25 MG PO TABS: 25.0000 mg | ORAL_TABLET | Freq: Two times a day (BID) | ORAL | Status: DC

## 2012-02-19 ENCOUNTER — Telehealth: Payer: Self-pay | Admitting: Family Medicine

## 2012-02-19 MED ORDER — PREDNISONE 20 MG PO TABS: 20.0000 mg | ORAL_TABLET | Freq: Every day | ORAL | Status: DC

## 2012-02-19 NOTE — Telephone Encounter (Signed)
Pt was given just a few pills for her gout and she is needing more.  The top of her foot is "killing her" [facility]

## 2012-02-19 NOTE — Telephone Encounter (Signed)
Patient states she took colchicine as directed then refilled it on 04/06 and took that also. Now foot is still hurting. Paged Dr. Denyse Amass and he  orders Prednisone 20 mg daily for 10 days. Also he suggested that if patient needed more pain medication could send in Vicodin. However patient states she still has pain medication and doesn't not need that now.  Appointment scheduled with Dr. Denyse Amass for 04/12 .

## 2012-02-20 ENCOUNTER — Other Ambulatory Visit: Payer: Self-pay | Admitting: Family Medicine

## 2012-02-20 DIAGNOSIS — I10 Essential (primary) hypertension: Secondary | ICD-10-CM

## 2012-02-20 MED ORDER — CARVEDILOL 25 MG PO TABS: 25.0000 mg | ORAL_TABLET | Freq: Two times a day (BID) | ORAL | Status: DC

## 2012-02-20 MED ORDER — LOSARTAN POTASSIUM-HCTZ 50-12.5 MG PO TABS: 1.0000 | ORAL_TABLET | Freq: Every day | ORAL | Status: DC

## 2012-02-20 MED ORDER — RANITIDINE HCL 150 MG PO CAPS: 150.0000 mg | ORAL_CAPSULE | Freq: Two times a day (BID) | ORAL | Status: DC

## 2012-02-20 MED ORDER — ROSUVASTATIN CALCIUM 20 MG PO TABS: 20.0000 mg | ORAL_TABLET | Freq: Every day | ORAL | Status: DC

## 2012-02-20 MED ORDER — AMLODIPINE BESYLATE 10 MG PO TABS: 10.0000 mg | ORAL_TABLET | Freq: Every day | ORAL | Status: DC

## 2012-02-21 ENCOUNTER — Ambulatory Visit: Payer: Medicare Other | Attending: Hematology and Oncology | Admitting: Physical Therapy

## 2012-02-21 DIAGNOSIS — IMO0001 Reserved for inherently not codable concepts without codable children: Secondary | ICD-10-CM | POA: Diagnosis not present

## 2012-02-21 DIAGNOSIS — M2560 Stiffness of unspecified joint, not elsewhere classified: Secondary | ICD-10-CM | POA: Diagnosis not present

## 2012-02-21 DIAGNOSIS — R609 Edema, unspecified: Secondary | ICD-10-CM | POA: Diagnosis not present

## 2012-02-21 DIAGNOSIS — M79609 Pain in unspecified limb: Secondary | ICD-10-CM | POA: Diagnosis not present

## 2012-02-23 ENCOUNTER — Ambulatory Visit (INDEPENDENT_AMBULATORY_CARE_PROVIDER_SITE_OTHER): Payer: Medicare Other | Admitting: Family Medicine

## 2012-02-23 ENCOUNTER — Encounter: Payer: Self-pay | Admitting: Family Medicine

## 2012-02-23 VITALS — BP 183/79 | HR 83 | Ht <= 58 in | Wt 118.0 lb

## 2012-02-23 DIAGNOSIS — M109 Gout, unspecified: Secondary | ICD-10-CM | POA: Diagnosis not present

## 2012-02-23 MED ORDER — INDOMETHACIN 50 MG PO CAPS: 50.0000 mg | ORAL_CAPSULE | Freq: Three times a day (TID) | ORAL | Status: DC | PRN

## 2012-02-23 MED ORDER — HYDROCODONE-ACETAMINOPHEN 5-500 MG PO TABS: 1.0000 | ORAL_TABLET | Freq: Three times a day (TID) | ORAL | Status: AC | PRN

## 2012-02-23 MED ORDER — PREDNISONE 20 MG PO TABS: 40.0000 mg | ORAL_TABLET | Freq: Every day | ORAL | Status: AC

## 2012-02-23 NOTE — Patient Instructions (Signed)
Thank you for coming in today. STOP the old prednisone.  Start indomethacin three times a day.  If it is working take it for a week.  If is does not work after a few days stop it, and take the predisone (2 pills a day).  If you still hurt in 1 week come back.  I would like to see you anyway in 1 month for your pressure.

## 2012-02-23 NOTE — Assessment & Plan Note (Signed)
Gout flare somewhat improved on 20 mg of prednisone daily. Plan to switch to indomethacin with a backup of prednisone if indomethacin is not working. Encouraged followup with me in one week if pain is still significant. Otherwise I would like to see her in one month. My differential includes gout but also includes stress fracture of the metatarsal or posterior tibialis tendinitis. Will followup if not improved would consider imaging at that point

## 2012-02-23 NOTE — Progress Notes (Signed)
[name] is a 78 y.o. female who presents to [facility] today for gout flare followup.  She was recently seen at this time practice [facility] for pain in the dorsal aspect of her left foot that was characteristic for previous gout flares.  She was started on culture seen which she could not tolerate due to GI side effects. I called in prednisone 20 mg daily and asked her to followup with me as soon as possible.  She notes diminished but continued pain in the left foot. She's taking oxycodone intermittently. The pain is still pretty significant.  Additionally she cannot afford colchicine   PMH, SH reviewed: Significant for hypertension and a history of gout ROS as above otherwise neg. No Chest pain, palpitations, SOB, Fever, Chills, Abd pain, N/V/D.  Medications reviewed. Current Outpatient Prescriptions  Medication Sig Dispense Refill  . acetaminophen (TYLENOL) 500 MG tablet Take 1,000 mg by mouth every 6 (six) hours as needed. For pain       . albuterol (PROVENTIL HFA;VENTOLIN HFA) 108 (90 BASE) MCG/ACT inhaler Inhale 2 puffs into the lungs every 6 (six) hours as needed. For shortness of breath  1 Inhaler  6  . amLODipine (NORVASC) 10 MG tablet Take 1 tablet (10 mg total) by mouth daily.  90 tablet  3  . carvedilol (COREG) 25 MG tablet Take 1 tablet (25 mg total) by mouth 2 (two) times daily.  180 tablet  3  . fluticasone (FLONASE) 50 MCG/ACT nasal spray Place 2 sprays into the nose daily as needed. For nasal congestion      . Fluticasone Propionate, Inhal, (FLOVENT DISKUS) 100 MCG/BLIST AEPB Inhale 1 puff into the lungs 2 (two) times daily.  60 each  6  . glipiZIDE (GLUCOTROL) 5 MG tablet Take 1 tablet (5 mg total) by mouth 2 (two) times daily before a meal.  60 tablet  6  . losartan-hydrochlorothiazide (HYZAAR) 50-12.5 MG per tablet Take 1 tablet by mouth daily.  90 tablet  3  . oxyCODONE-acetaminophen (PERCOCET) 10-325 MG per tablet Take 1 tablet by mouth every 6 (six) hours as needed for pain.  28  tablet  0  . PATADAY 0.2 % SOLN Place 1 drop into both eyes daily as needed. For itchy eyes      . polyethylene glycol (MIRALAX) powder Take 17 g by mouth daily as needed. For constipation. Mix in 8 oz glass of water.      . ranitidine (ZANTAC) 150 MG capsule Take 1 capsule (150 mg total) by mouth 2 (two) times daily.  180 capsule  6  . rosuvastatin (CRESTOR) 20 MG tablet Take 1 tablet (20 mg total) by mouth at bedtime.  90 tablet  6  . tamoxifen (NOLVADEX) 20 MG tablet Take 1 tablet (20 mg total) by mouth daily.  90 tablet  3  . aspirin (ASPIRIN CHILDRENS) 81 MG chewable tablet Chew 81 mg by mouth daily.        Marland Kitchen HYDROcodone-acetaminophen (VICODIN) 5-500 MG per tablet Take 1 tablet by mouth every 8 (eight) hours as needed for pain.  20 tablet  1  . indomethacin (INDOCIN) 50 MG capsule Take 1 capsule (50 mg total) by mouth every 8 (eight) hours as needed (Pain). Take for 1 week at least.  42 capsule  0  . metFORMIN (GLUCOPHAGE) 500 MG tablet Take 1 tablet (500 mg total) by mouth 2 (two) times daily with a meal.  60 tablet  11  . predniSONE (DELTASONE) 20 MG tablet Take  2 tablets (40 mg total) by mouth daily. Take if the indomethacin does not help.  14 tablet  0  . traMADol (ULTRAM) 50 MG tablet Take 1 tablet (50 mg total) by mouth every 6 (six) hours as needed for pain. Maximum dose= 8 tablets per day  90 tablet  2    Exam:  BP 183/79  Pulse 83  Ht 4\' 10"  (1.473 m)  Wt 118 lb (53.524 kg)  BMI 24.66 kg/m2 Gen: Well NAD Lungs: CTABL Nl WOB Heart: RRR no MRG MSK: Swelling of the ankles bilaterally. Left foot is normal appearing tender along the first metatarsal and along the medial malleolus.  Normal foot motion

## 2012-03-12 ENCOUNTER — Encounter: Payer: Self-pay | Admitting: Family Medicine

## 2012-03-12 NOTE — Telephone Encounter (Signed)
Patient called wanting to speak specifically with Dr. Neal or Hensel about not feeling well. I attempted to make an appt, but she said she wouldn't be able to come because she had no way to get here and that she just wanted to speak with them about how she is feeling.   

## 2012-03-13 DIAGNOSIS — J189 Pneumonia, unspecified organism: Secondary | ICD-10-CM

## 2012-03-13 HISTORY — DX: Pneumonia, unspecified organism: J18.9

## 2012-03-18 ENCOUNTER — Telehealth: Payer: Self-pay | Admitting: *Deleted

## 2012-03-18 NOTE — Telephone Encounter (Signed)
This encounter was created in error - please disregard.

## 2012-03-18 NOTE — Telephone Encounter (Signed)
Received call form daughter this AM stating patient is having a burning sensation in left chest , Also is expectorating white bubbly  sputum.  Consulted with Dr. Kandee Keen and he advises for patient to go to ED for evaluation. Daughter voices understanding.

## 2012-03-18 NOTE — Telephone Encounter (Signed)
error 

## 2012-03-22 ENCOUNTER — Encounter (HOSPITAL_COMMUNITY): Payer: Self-pay | Admitting: *Deleted

## 2012-03-22 ENCOUNTER — Emergency Department (HOSPITAL_COMMUNITY)
Admission: EM | Admit: 2012-03-22 | Discharge: 2012-03-22 | Disposition: A | Discharge status: Home / Self Care | Payer: Medicare Other | Attending: Emergency Medicine | Admitting: Emergency Medicine

## 2012-03-22 ENCOUNTER — Emergency Department (HOSPITAL_COMMUNITY): Payer: Medicare Other

## 2012-03-22 DIAGNOSIS — I2510 Atherosclerotic heart disease of native coronary artery without angina pectoris: Secondary | ICD-10-CM | POA: Insufficient documentation

## 2012-03-22 DIAGNOSIS — E119 Type 2 diabetes mellitus without complications: Secondary | ICD-10-CM | POA: Insufficient documentation

## 2012-03-22 DIAGNOSIS — R509 Fever, unspecified: Secondary | ICD-10-CM | POA: Diagnosis not present

## 2012-03-22 DIAGNOSIS — R05 Cough: Secondary | ICD-10-CM | POA: Diagnosis not present

## 2012-03-22 DIAGNOSIS — J069 Acute upper respiratory infection, unspecified: Secondary | ICD-10-CM

## 2012-03-22 DIAGNOSIS — E785 Hyperlipidemia, unspecified: Secondary | ICD-10-CM | POA: Insufficient documentation

## 2012-03-22 DIAGNOSIS — I509 Heart failure, unspecified: Secondary | ICD-10-CM | POA: Insufficient documentation

## 2012-03-22 DIAGNOSIS — J45909 Unspecified asthma, uncomplicated: Secondary | ICD-10-CM | POA: Insufficient documentation

## 2012-03-22 DIAGNOSIS — I10 Essential (primary) hypertension: Secondary | ICD-10-CM | POA: Insufficient documentation

## 2012-03-22 DIAGNOSIS — R059 Cough, unspecified: Secondary | ICD-10-CM | POA: Diagnosis not present

## 2012-03-22 DIAGNOSIS — K219 Gastro-esophageal reflux disease without esophagitis: Secondary | ICD-10-CM | POA: Diagnosis not present

## 2012-03-22 LAB — URINALYSIS, ROUTINE W REFLEX MICROSCOPIC
Bilirubin Urine: NEGATIVE
Hgb urine dipstick: NEGATIVE
Protein, ur: NEGATIVE mg/dL
Specific Gravity, Urine: 1.011 (ref 1.005–1.030)
Urobilinogen, UA: 0.2 mg/dL (ref 0.0–1.0)

## 2012-03-22 LAB — BASIC METABOLIC PANEL
GFR calc non Af Amer: 53 mL/min — ABNORMAL LOW (ref >90)
Glucose, Bld: 108 mg/dL — ABNORMAL HIGH (ref 70–99)
Potassium: 3.2 mEq/L — ABNORMAL LOW (ref 3.5–5.1)
Sodium: 140 mEq/L (ref 135–145)

## 2012-03-22 LAB — TROPONIN I: Troponin I: <0.3 ng/mL (ref <0.30)

## 2012-03-22 LAB — CBC
MCH: 22.7 pg — ABNORMAL LOW (ref 26.0–34.0)
Platelets: 244 10*3/uL (ref 150–400)
RBC: 4.75 MIL/uL (ref 3.87–5.11)
WBC: 6.6 10*3/uL (ref 4.0–10.5)

## 2012-03-22 LAB — DIFFERENTIAL
Eosinophils Absolute: 0.1 10*3/uL (ref 0.0–0.7)
Lymphocytes Relative: 28 % (ref 12–46)
Lymphs Abs: 1.9 10*3/uL (ref 0.7–4.0)
Neutrophils Relative %: 66 % (ref 43–77)

## 2012-03-22 LAB — PROCALCITONIN: Procalcitonin: <0.1 ng/mL

## 2012-03-22 LAB — LACTIC ACID, PLASMA: Lactic Acid, Venous: 1 mmol/L (ref 0.5–2.2)

## 2012-03-22 LAB — URINE MICROSCOPIC-ADD ON

## 2012-03-22 MED ORDER — ALBUTEROL SULFATE HFA 108 (90 BASE) MCG/ACT IN AERS: 1.0000 | INHALATION_SPRAY | Freq: Four times a day (QID) | RESPIRATORY_TRACT | Status: DC | PRN

## 2012-03-22 MED ORDER — SODIUM CHLORIDE 0.9 % IV SOLN
INTRAVENOUS | Status: DC
  Administered 2012-03-22: 21:00:00 via INTRAVENOUS

## 2012-03-22 MED ORDER — ALBUTEROL SULFATE HFA 108 (90 BASE) MCG/ACT IN AERS
4.0000 | INHALATION_SPRAY | Freq: Once | RESPIRATORY_TRACT | Status: AC
  Administered 2012-03-22: 4 via RESPIRATORY_TRACT
  Filled 2012-03-22: qty 6.7

## 2012-03-22 NOTE — ED Notes (Signed)
Pt reports productive cough since yesterday. Pt reports fevers. Denies sob.

## 2012-03-22 NOTE — ED Provider Notes (Signed)
78yo F, c/o cough and fever since yesterday.  Apparently pt's daughter called pt's PMD at [facility] this morning c/o "burning" left sided CP and "frothy white" sputum with cough.  Pt denies this.  Awake/alert, afebrile, lungs coarse, RRR, abd benign, no pedal edema.   Work up ordered.  I saw this patient for screening purposes.  Pt is stable, but further workup and evaluation is needed beyond triage capabilities.   Laray Anger, DO 03/22/12 2004

## 2012-03-22 NOTE — ED Provider Notes (Signed)
History     CSN: [id number]  Arrival date & time 03/22/12  1745   None     Chief Complaint  Patient presents with  . Cough  . Fever    (Consider location/radiation/quality/duration/timing/severity/associated sxs/prior treatment) Patient is a 77 y.o. female presenting with cough. The history is provided by the patient.  Cough This is a new problem. Episode onset: 2-3 days ago. The problem occurs every few minutes. The problem has not changed since onset.The cough is productive of sputum. Maximum temperature: subjective. The fever has been present for 1 to 2 days. Pertinent negatives include no chest pain, no headaches and no shortness of breath. She has tried nothing for the symptoms. The treatment provided no relief. She is not a smoker.    Past Medical History  Diagnosis Date  . Breast cancer 10/2010    Invasive Ductal Carcinoma, s/p bilateral mastectomy, now on Tamoxifen. Followed by Dr Dalene Carrow.   . Diabetes mellitus   . Hypertension   . Hyperlipidemia   . Anemia     Iron def anemia  . CAD (coronary artery disease)   . CHF (congestive heart failure)     Systolic   . History of colon cancer   . Neuropathy   . Allergy   . GERD (gastroesophageal reflux disease)   . Hiatal hernia   . Diverticulosis   . Asthma   . Arthritis   . H/O: GI bleed   . Breast cancer 2011    s/p Bilateral masectomy    Past Surgical History  Procedure Date  . Breast masectomy 10/2010    Bilateral masectomy by Dr Ezzard Standing s/p invasive ductal carcinoma.  . Cardiac  catherization 2007    Severe 3-vessel disease.  EF 20-25%.  . Esophagogastroduodenoscopy 2001    Esophageal Tear  . Total knee arthroplasty 2001    Family History  Problem Relation Age of Onset  . Sickle cell anemia Other   . Heart disease Mother   . Heart disease Father     History  Substance Use Topics  . Smoking status: Never Smoker   . Smokeless tobacco: Current User    Types: Snuff  . Alcohol Use: No    OB  History    Grav Para Term Preterm Abortions TAB SAB Ect Mult Living                  Review of Systems  Constitutional: Negative for fever and fatigue.  HENT: Negative for congestion, drooling and neck pain.   Eyes: Negative for pain.  Respiratory: Positive for cough. Negative for shortness of breath.   Cardiovascular: Negative for chest pain.  Gastrointestinal: Negative for nausea, vomiting, abdominal pain and diarrhea.  Genitourinary: Negative for dysuria and hematuria.  Musculoskeletal: Negative for back pain and gait problem.  Skin: Negative for color change.  Neurological: Negative for dizziness and headaches.  Hematological: Negative for adenopathy.  Psychiatric/Behavioral: Negative for behavioral problems.  All other systems reviewed and are negative.    Allergies  Peanuts and Lisinopril  Home Medications   Current Outpatient Rx  Name Route Sig Dispense Refill  . ACETAMINOPHEN 500 MG PO TABS Oral Take 1,000 mg by mouth every 6 (six) hours as needed. For pain     . ALBUTEROL SULFATE HFA 108 (90 BASE) MCG/ACT IN AERS Inhalation Inhale 2 puffs into the lungs every 6 (six) hours as needed. For shortness of breath 1 Inhaler 6  . AMLODIPINE BESYLATE 10 MG PO TABS Oral Take 1  tablet (10 mg total) by mouth daily. 90 tablet 3  . ASPIRIN 81 MG PO CHEW Oral Chew 81 mg by mouth daily.      Marland Kitchen CARVEDILOL 25 MG PO TABS Oral Take 1 tablet (25 mg total) by mouth 2 (two) times daily. 180 tablet 3  . FLUTICASONE PROPIONATE 50 MCG/ACT NA SUSP Nasal Place 2 sprays into the nose daily as needed. For nasal congestion    . FLUTICASONE PROPIONATE (INHAL) 100 MCG/BLIST IN AEPB Inhalation Inhale 1 puff into the lungs 2 (two) times daily. 60 each 6  . GLIPIZIDE 5 MG PO TABS Oral Take 1 tablet (5 mg total) by mouth 2 (two) times daily before a meal. 60 tablet 6  . INDOMETHACIN 50 MG PO CAPS Oral Take 1 capsule (50 mg total) by mouth every 8 (eight) hours as needed (Pain). Take for 1 week at least. 42  capsule 0  . LOSARTAN POTASSIUM-HCTZ 50-12.5 MG PO TABS Oral Take 1 tablet by mouth daily. 90 tablet 3  . PATADAY 0.2 % OP SOLN Both Eyes Place 1 drop into both eyes daily as needed. For itchy eyes    . POLYETHYLENE GLYCOL 3350 PO POWD Oral Take 17 g by mouth daily as needed. For constipation. Mix in 8 oz glass of water.    Marland Kitchen RANITIDINE HCL 150 MG PO CAPS Oral Take 1 capsule (150 mg total) by mouth 2 (two) times daily. 180 capsule 6  . ROSUVASTATIN CALCIUM 20 MG PO TABS Oral Take 1 tablet (20 mg total) by mouth at bedtime. 90 tablet 6  . TAMOXIFEN CITRATE 20 MG PO TABS Oral Take 1 tablet (20 mg total) by mouth daily. 90 tablet 3    Bilateral Breast Cancer  . ALBUTEROL SULFATE HFA 108 (90 BASE) MCG/ACT IN AERS Inhalation Inhale 1-2 puffs into the lungs every 6 (six) hours as needed for wheezing. 1 Inhaler 0  . METFORMIN HCL 500 MG PO TABS Oral Take 1 tablet (500 mg total) by mouth 2 (two) times daily with a meal. 60 tablet 11  . TRAMADOL HCL 50 MG PO TABS Oral Take 1 tablet (50 mg total) by mouth every 6 (six) hours as needed for pain. Maximum dose= 8 tablets per day 90 tablet 2    BP 160/66  Pulse 80  Temp(Src) 99 F (37.2 C) (Oral)  Resp 20  SpO2 100%  Physical Exam  Constitutional: She is oriented to person, place, and time. She appears well-developed and well-nourished.  HENT:  Head: Normocephalic.  Mouth/Throat: No oropharyngeal exudate.  Eyes: Conjunctivae and EOM are normal. Pupils are equal, round, and reactive to light.  Neck: Normal range of motion. Neck supple.  Cardiovascular: Normal rate, regular rhythm, normal heart sounds and intact distal pulses.  Exam reveals no gallop and no friction rub.   No murmur heard. Pulmonary/Chest: Effort normal and breath sounds normal. No respiratory distress. She has no wheezes.  Abdominal: Soft. Bowel sounds are normal. There is no tenderness.  Musculoskeletal: Normal range of motion. She exhibits no edema and no tenderness.    Neurological: She is alert and oriented to person, place, and time.  Skin: Skin is warm and dry.  Psychiatric: She has a normal mood and affect. Her behavior is normal.    ED Course  Procedures (including critical care time)  Labs Reviewed  URINALYSIS, ROUTINE W REFLEX MICROSCOPIC - Abnormal; Notable for the following:    APPearance CLOUDY (*)    Leukocytes, UA TRACE (*)  All other components within normal limits  CBC - Abnormal; Notable for the following:    Hemoglobin 10.8 (*)    HCT 32.8 (*)    MCV 69.1 (*)    MCH 22.7 (*)    RDW 15.6 (*)    All other components within normal limits  BASIC METABOLIC PANEL - Abnormal; Notable for the following:    Potassium 3.2 (*)    Glucose, Bld 108 (*)    GFR calc non Af Amer 53 (*)    GFR calc Af Amer 62 (*)    All other components within normal limits  URINE MICROSCOPIC-ADD ON - Abnormal; Notable for the following:    Bacteria, UA MANY (*)    All other components within normal limits  PRO B NATRIURETIC PEPTIDE  TROPONIN I  DIFFERENTIAL  LACTIC ACID, PLASMA  PROCALCITONIN  URINE CULTURE   Dg Chest 2 View  03/22/2012  *RADIOLOGY REPORT*  Clinical Data: Cough, fever  CHEST - 2 VIEW  Comparison: 10/18/2011  Findings: Cardiomediastinal silhouette is stable.  No acute infiltrate or pleural effusion.  No pulmonary edema.  Mild elevation of the right anterior hemidiaphragm. Stable mild degenerative changes thoracolumbar spine.  Stable mild compression deformity of L1 or L2 vertebral body.  IMPRESSION: No active disease.  Mild elevation of the right hemidiaphragm.  Original Report Authenticated By: Natasha Mead, M.D.     1. Viral URI with cough       MDM  11:50 PM 78 y.o. female w hx of CAD, CHF, DM pw cough for 2-3 days. Pt notes she has had subj fever, coughing up white sputum. Pt AFVSS here, appears well on exam, breathing comfortably. Labs/imaging not contributory. Will give alb tx to see if it improves pt's sx.   11:50 PM: Pt  feeling better, will give Rx for albuterol. I have discussed the diagnosis/risks/treatment options with the patient and family and believe the pt to be eligible for discharge home to follow-up with pcp if no better in 2 days. We also discussed returning to the ED immediately if new or worsening sx occur. We discussed the sx which are most concerning (e.g., worsening cough, sob, cp) that necessitate immediate return. Any new prescriptions provided to the patient are listed below.  New Prescriptions   ALBUTEROL (PROVENTIL HFA;VENTOLIN HFA) 108 (90 BASE) MCG/ACT INHALER    Inhale 1-2 puffs into the lungs every 6 (six) hours as needed for wheezing.   Clinical Impression 1. Viral URI with cough        Purvis Sheffield, MD 03/22/12 2350

## 2012-03-22 NOTE — ED Provider Notes (Signed)
78 year old female has had a cough for the last 3 days. Cough is productive of a small amount of clear to slightly yellow sputum. She's had a subjective fever but no chills or sweats and she denies any dyspnea. On exam, there is a slight prolongation of exhalation phase. Heart has regular rate and rhythm. Extremities have no cyanosis or edema. Chest x-ray shows no evidence of infiltrate. She was given a therapeutic trial of albuterol.   Date: 03/22/2012  Rate: 79  Rhythm: normal sinus rhythm  QRS Axis: normal  Intervals: QT prolonged  ST/T Wave abnormalities: nonspecific T wave changes  Conduction Disutrbances:none  Narrative Interpretation: Borderline T wave flattening, borderline QT prolongation. No old ECG available for comparison.  Old EKG Reviewed: none available    Dione Booze, MD 03/22/12 2205

## 2012-03-24 NOTE — ED Provider Notes (Signed)
I saw and evaluated the patient, reviewed the resident's note and I agree with the findings and plan.   Wandell Scullion, MD 03/24/12 0102 

## 2012-03-25 ENCOUNTER — Emergency Department (HOSPITAL_COMMUNITY): Payer: Medicare Other

## 2012-03-25 ENCOUNTER — Observation Stay (HOSPITAL_COMMUNITY)
Admission: EM | Admit: 2012-03-25 | Discharge: 2012-03-27 | Disposition: A | Discharge status: Home / Self Care | Payer: Medicare Other | Attending: Family Medicine | Admitting: Family Medicine

## 2012-03-25 ENCOUNTER — Encounter (HOSPITAL_COMMUNITY): Payer: Self-pay | Admitting: Emergency Medicine

## 2012-03-25 DIAGNOSIS — R062 Wheezing: Secondary | ICD-10-CM | POA: Diagnosis not present

## 2012-03-25 DIAGNOSIS — R0602 Shortness of breath: Secondary | ICD-10-CM | POA: Diagnosis not present

## 2012-03-25 DIAGNOSIS — R079 Chest pain, unspecified: Secondary | ICD-10-CM | POA: Diagnosis not present

## 2012-03-25 DIAGNOSIS — I5020 Unspecified systolic (congestive) heart failure: Secondary | ICD-10-CM | POA: Insufficient documentation

## 2012-03-25 DIAGNOSIS — G589 Mononeuropathy, unspecified: Secondary | ICD-10-CM | POA: Insufficient documentation

## 2012-03-25 DIAGNOSIS — E1140 Type 2 diabetes mellitus with diabetic neuropathy, unspecified: Secondary | ICD-10-CM | POA: Diagnosis present

## 2012-03-25 DIAGNOSIS — R071 Chest pain on breathing: Secondary | ICD-10-CM | POA: Diagnosis not present

## 2012-03-25 DIAGNOSIS — Z853 Personal history of malignant neoplasm of breast: Secondary | ICD-10-CM | POA: Insufficient documentation

## 2012-03-25 DIAGNOSIS — J209 Acute bronchitis, unspecified: Secondary | ICD-10-CM | POA: Diagnosis not present

## 2012-03-25 DIAGNOSIS — E119 Type 2 diabetes mellitus without complications: Secondary | ICD-10-CM | POA: Diagnosis not present

## 2012-03-25 DIAGNOSIS — I509 Heart failure, unspecified: Secondary | ICD-10-CM | POA: Diagnosis not present

## 2012-03-25 DIAGNOSIS — D509 Iron deficiency anemia, unspecified: Secondary | ICD-10-CM | POA: Diagnosis not present

## 2012-03-25 DIAGNOSIS — J189 Pneumonia, unspecified organism: Secondary | ICD-10-CM | POA: Diagnosis not present

## 2012-03-25 DIAGNOSIS — R05 Cough: Secondary | ICD-10-CM | POA: Insufficient documentation

## 2012-03-25 DIAGNOSIS — M159 Polyosteoarthritis, unspecified: Secondary | ICD-10-CM | POA: Diagnosis present

## 2012-03-25 DIAGNOSIS — C50919 Malignant neoplasm of unspecified site of unspecified female breast: Secondary | ICD-10-CM

## 2012-03-25 DIAGNOSIS — Z85038 Personal history of other malignant neoplasm of large intestine: Secondary | ICD-10-CM | POA: Diagnosis not present

## 2012-03-25 DIAGNOSIS — R0789 Other chest pain: Secondary | ICD-10-CM | POA: Diagnosis present

## 2012-03-25 DIAGNOSIS — M129 Arthropathy, unspecified: Secondary | ICD-10-CM | POA: Diagnosis not present

## 2012-03-25 DIAGNOSIS — I10 Essential (primary) hypertension: Secondary | ICD-10-CM | POA: Diagnosis not present

## 2012-03-25 DIAGNOSIS — J40 Bronchitis, not specified as acute or chronic: Secondary | ICD-10-CM

## 2012-03-25 DIAGNOSIS — I2510 Atherosclerotic heart disease of native coronary artery without angina pectoris: Secondary | ICD-10-CM | POA: Insufficient documentation

## 2012-03-25 DIAGNOSIS — J449 Chronic obstructive pulmonary disease, unspecified: Secondary | ICD-10-CM | POA: Diagnosis not present

## 2012-03-25 DIAGNOSIS — Z9010 Acquired absence of unspecified breast and nipple: Secondary | ICD-10-CM | POA: Insufficient documentation

## 2012-03-25 DIAGNOSIS — E785 Hyperlipidemia, unspecified: Secondary | ICD-10-CM | POA: Diagnosis not present

## 2012-03-25 DIAGNOSIS — R059 Cough, unspecified: Secondary | ICD-10-CM | POA: Diagnosis not present

## 2012-03-25 DIAGNOSIS — J45909 Unspecified asthma, uncomplicated: Secondary | ICD-10-CM | POA: Diagnosis present

## 2012-03-25 DIAGNOSIS — J4489 Other specified chronic obstructive pulmonary disease: Secondary | ICD-10-CM | POA: Insufficient documentation

## 2012-03-25 DIAGNOSIS — I5022 Chronic systolic (congestive) heart failure: Secondary | ICD-10-CM | POA: Diagnosis not present

## 2012-03-25 HISTORY — DX: Shortness of breath: R06.02

## 2012-03-25 HISTORY — DX: Pneumonia, unspecified organism: J18.9

## 2012-03-25 MED ORDER — ONDANSETRON HCL 4 MG/2ML IJ SOLN
4.0000 mg | Freq: Once | INTRAMUSCULAR | Status: AC
  Administered 2012-03-26: 4 mg via INTRAVENOUS
  Filled 2012-03-25: qty 2

## 2012-03-25 MED ORDER — MOXIFLOXACIN HCL IN NACL 400 MG/250ML IV SOLN
400.0000 mg | Freq: Once | INTRAVENOUS | Status: AC
  Administered 2012-03-26: 400 mg via INTRAVENOUS
  Filled 2012-03-25: qty 250

## 2012-03-25 MED ORDER — MORPHINE SULFATE 4 MG/ML IJ SOLN
4.0000 mg | Freq: Once | INTRAMUSCULAR | Status: AC
  Administered 2012-03-26: 4 mg via INTRAVENOUS
  Filled 2012-03-25: qty 1

## 2012-03-25 NOTE — ED Notes (Signed)
PT. REPORTS PERSISTENT PRODUCTIVE COUGH , SOB AND NASAL CONGESTION , SEEN HERE LAST Thursday - DIAGNOSED WITH SINUS INFECTION - PRESCRIBED WITH MDI WITH NO IMPROVEMENT.

## 2012-03-25 NOTE — ED Notes (Signed)
Seen by nurse first, 02 sats 100% on RA

## 2012-03-26 ENCOUNTER — Ambulatory Visit: Payer: Medicare Other | Admitting: Family Medicine

## 2012-03-26 ENCOUNTER — Encounter (HOSPITAL_COMMUNITY): Payer: Self-pay | Admitting: General Practice

## 2012-03-26 DIAGNOSIS — R0789 Other chest pain: Secondary | ICD-10-CM | POA: Diagnosis present

## 2012-03-26 DIAGNOSIS — J159 Unspecified bacterial pneumonia: Secondary | ICD-10-CM

## 2012-03-26 DIAGNOSIS — J189 Pneumonia, unspecified organism: Secondary | ICD-10-CM | POA: Diagnosis present

## 2012-03-26 LAB — GLUCOSE, CAPILLARY: Glucose-Capillary: 135 mg/dL — ABNORMAL HIGH (ref 70–99)

## 2012-03-26 LAB — URINE CULTURE

## 2012-03-26 LAB — POCT I-STAT, CHEM 8
Creatinine, Ser: 0.9 mg/dL (ref 0.50–1.10)
Glucose, Bld: 134 mg/dL — ABNORMAL HIGH (ref 70–99)
HCT: 31 % — ABNORMAL LOW (ref 36.0–46.0)
Hemoglobin: 10.5 g/dL — ABNORMAL LOW (ref 12.0–15.0)
Potassium: 3.5 mEq/L (ref 3.5–5.1)
TCO2: 26 mmol/L (ref 0–100)

## 2012-03-26 LAB — CBC
MCH: 22.7 pg — ABNORMAL LOW (ref 26.0–34.0)
MCHC: 32.7 g/dL (ref 30.0–36.0)
MCV: 69.5 fL — ABNORMAL LOW (ref 78.0–100.0)
Platelets: 182 10*3/uL (ref 150–400)
RDW: 15.6 % — ABNORMAL HIGH (ref 11.5–15.5)
WBC: 5.1 10*3/uL (ref 4.0–10.5)

## 2012-03-26 MED ORDER — FLUTICASONE PROPIONATE HFA 110 MCG/ACT IN AERO
1.0000 | INHALATION_SPRAY | Freq: Two times a day (BID) | RESPIRATORY_TRACT | Status: DC
  Administered 2012-03-26 – 2012-03-27 (×2): 1 via RESPIRATORY_TRACT
  Filled 2012-03-26: qty 12

## 2012-03-26 MED ORDER — AMLODIPINE BESYLATE 10 MG PO TABS
10.0000 mg | ORAL_TABLET | Freq: Every day | ORAL | Status: DC
  Administered 2012-03-26 – 2012-03-27 (×2): 10 mg via ORAL
  Filled 2012-03-26 (×3): qty 1

## 2012-03-26 MED ORDER — HYDROCHLOROTHIAZIDE 25 MG PO TABS
25.0000 mg | ORAL_TABLET | Freq: Every day | ORAL | Status: DC
  Administered 2012-03-26 – 2012-03-27 (×2): 25 mg via ORAL
  Filled 2012-03-26 (×3): qty 1

## 2012-03-26 MED ORDER — CARVEDILOL 25 MG PO TABS
25.0000 mg | ORAL_TABLET | Freq: Two times a day (BID) | ORAL | Status: DC
  Administered 2012-03-26 – 2012-03-27 (×3): 25 mg via ORAL
  Filled 2012-03-26 (×6): qty 1

## 2012-03-26 MED ORDER — ONDANSETRON HCL 4 MG PO TABS: 4.0000 mg | ORAL_TABLET | Freq: Four times a day (QID) | ORAL | Status: DC | PRN

## 2012-03-26 MED ORDER — POLYETHYLENE GLYCOL 3350 17 GM/SCOOP PO POWD
17.0000 g | Freq: Every day | ORAL | Status: DC | PRN
  Filled 2012-03-26: qty 255

## 2012-03-26 MED ORDER — ENOXAPARIN SODIUM 40 MG/0.4ML ~~LOC~~ SOLN
40.0000 mg | SUBCUTANEOUS | Status: DC
  Administered 2012-03-26 – 2012-03-27 (×2): 40 mg via SUBCUTANEOUS
  Filled 2012-03-26 (×2): qty 0.4

## 2012-03-26 MED ORDER — ACETAMINOPHEN 650 MG RE SUPP
650.0000 mg | Freq: Four times a day (QID) | RECTAL | Status: DC
  Filled 2012-03-26 (×8): qty 1

## 2012-03-26 MED ORDER — FLUTICASONE PROPIONATE 50 MCG/ACT NA SUSP: 2.0000 | Freq: Every day | NASAL | Status: DC | PRN

## 2012-03-26 MED ORDER — ALBUTEROL SULFATE (5 MG/ML) 0.5% IN NEBU
2.5000 mg | INHALATION_SOLUTION | RESPIRATORY_TRACT | Status: DC
  Administered 2012-03-26 – 2012-03-27 (×3): 2.5 mg via RESPIRATORY_TRACT
  Filled 2012-03-26 (×3): qty 0.5

## 2012-03-26 MED ORDER — SODIUM CHLORIDE 0.9 % IV SOLN: 250.0000 mL | INTRAVENOUS | Status: DC | PRN

## 2012-03-26 MED ORDER — TRAMADOL HCL 50 MG PO TABS
50.0000 mg | ORAL_TABLET | Freq: Three times a day (TID) | ORAL | Status: DC | PRN
  Filled 2012-03-26: qty 1

## 2012-03-26 MED ORDER — ONDANSETRON HCL 4 MG/2ML IJ SOLN: 4.0000 mg | Freq: Four times a day (QID) | INTRAMUSCULAR | Status: DC | PRN

## 2012-03-26 MED ORDER — MOXIFLOXACIN HCL IN NACL 400 MG/250ML IV SOLN: 400.0000 mg | Freq: Every day | INTRAVENOUS | Status: DC

## 2012-03-26 MED ORDER — OXYCODONE HCL 5 MG PO TABS: 5.0000 mg | ORAL_TABLET | ORAL | Status: DC | PRN

## 2012-03-26 MED ORDER — MORPHINE SULFATE 4 MG/ML IJ SOLN
4.0000 mg | Freq: Once | INTRAMUSCULAR | Status: AC
  Administered 2012-03-26: 4 mg via INTRAVENOUS
  Filled 2012-03-26: qty 1

## 2012-03-26 MED ORDER — FAMOTIDINE 20 MG PO TABS
20.0000 mg | ORAL_TABLET | Freq: Two times a day (BID) | ORAL | Status: DC
  Administered 2012-03-26 – 2012-03-27 (×3): 20 mg via ORAL
  Filled 2012-03-26 (×5): qty 1

## 2012-03-26 MED ORDER — TAMOXIFEN CITRATE 10 MG PO TABS
20.0000 mg | ORAL_TABLET | Freq: Every day | ORAL | Status: DC
  Administered 2012-03-26 – 2012-03-27 (×2): 20 mg via ORAL
  Filled 2012-03-26 (×3): qty 2

## 2012-03-26 MED ORDER — GUAIFENESIN ER 600 MG PO TB12
600.0000 mg | ORAL_TABLET | Freq: Two times a day (BID) | ORAL | Status: DC
  Administered 2012-03-26 – 2012-03-27 (×3): 600 mg via ORAL
  Filled 2012-03-26 (×5): qty 1

## 2012-03-26 MED ORDER — ACETAMINOPHEN 325 MG PO TABS
650.0000 mg | ORAL_TABLET | Freq: Four times a day (QID) | ORAL | Status: DC
  Administered 2012-03-26 – 2012-03-27 (×6): 650 mg via ORAL
  Filled 2012-03-26 (×6): qty 2

## 2012-03-26 MED ORDER — LOSARTAN POTASSIUM-HCTZ 50-12.5 MG PO TABS: 1.0000 | ORAL_TABLET | Freq: Every day | ORAL | Status: DC

## 2012-03-26 MED ORDER — LOSARTAN POTASSIUM 50 MG PO TABS
50.0000 mg | ORAL_TABLET | Freq: Every day | ORAL | Status: DC
  Administered 2012-03-26 – 2012-03-27 (×2): 50 mg via ORAL
  Filled 2012-03-26 (×3): qty 1

## 2012-03-26 MED ORDER — SODIUM CHLORIDE 0.9 % IJ SOLN: 3.0000 mL | INTRAMUSCULAR | Status: DC | PRN

## 2012-03-26 MED ORDER — SODIUM CHLORIDE 0.9 % IJ SOLN
3.0000 mL | Freq: Two times a day (BID) | INTRAMUSCULAR | Status: DC
  Administered 2012-03-26 (×2): 3 mL via INTRAVENOUS

## 2012-03-26 MED ORDER — ASPIRIN 81 MG PO CHEW
81.0000 mg | CHEWABLE_TABLET | Freq: Every day | ORAL | Status: DC
  Administered 2012-03-26 – 2012-03-27 (×2): 81 mg via ORAL
  Filled 2012-03-26 (×2): qty 1

## 2012-03-26 MED ORDER — ATORVASTATIN CALCIUM 40 MG PO TABS
40.0000 mg | ORAL_TABLET | Freq: Every day | ORAL | Status: DC
  Filled 2012-03-26 (×3): qty 1

## 2012-03-26 MED ORDER — ALBUTEROL SULFATE HFA 108 (90 BASE) MCG/ACT IN AERS
2.0000 | INHALATION_SPRAY | RESPIRATORY_TRACT | Status: DC | PRN
  Administered 2012-03-26: 2 via RESPIRATORY_TRACT
  Filled 2012-03-26: qty 6.7

## 2012-03-26 MED ORDER — INSULIN ASPART 100 UNIT/ML ~~LOC~~ SOLN
0.0000 [IU] | Freq: Three times a day (TID) | SUBCUTANEOUS | Status: DC
  Administered 2012-03-26 (×2): 1 [IU] via SUBCUTANEOUS
  Administered 2012-03-27 (×2): 2 [IU] via SUBCUTANEOUS

## 2012-03-26 MED ORDER — IPRATROPIUM BROMIDE 0.02 % IN SOLN: 0.5000 mg | RESPIRATORY_TRACT | Status: DC | PRN

## 2012-03-26 NOTE — ED Provider Notes (Signed)
History     CSN: [id number]  Arrival date & time 03/25/12  1945   First MD Initiated Contact with Patient 03/25/12 2332      Chief Complaint  Patient presents with  . Cough    (Consider location/radiation/quality/duration/timing/severity/associated sxs/prior treatment) HPI History provided by patient and son bedside. Has history of COPD and productive cough for the last 5 days. Was seen here when symptoms began and was given a breathing treatment and diagnosed with sinusitis. Patient has been doing worse at home with increased cough, green and yellow sputum production and now has developed left-sided chest pain worse with coughing. No rash. She denies wheezing. Only trouble breathing as during coughing spells. Her chest pain only hurts when she coughs. It is sharp in quality and not radiating from her left lower ribs.  Symptoms moderate to severe Past Medical History  Diagnosis Date  . Breast cancer 10/2010    Invasive Ductal Carcinoma, s/p bilateral mastectomy, now on Tamoxifen. Followed by Dr Dalene Carrow.   . Diabetes mellitus   . Hypertension   . Hyperlipidemia   . Anemia     Iron def anemia  . CAD (coronary artery disease)   . CHF (congestive heart failure)     Systolic   . History of colon cancer   . Neuropathy   . Allergy   . GERD (gastroesophageal reflux disease)   . Hiatal hernia   . Diverticulosis   . Asthma   . Arthritis   . H/O: GI bleed   . Breast cancer 2011    s/p Bilateral masectomy    Past Surgical History  Procedure Date  . Breast masectomy 10/2010    Bilateral masectomy by Dr Ezzard Standing s/p invasive ductal carcinoma.  . Cardiac  catherization 2007    Severe 3-vessel disease.  EF 20-25%.  . Esophagogastroduodenoscopy 2001    Esophageal Tear  . Total knee arthroplasty 2001    Family History  Problem Relation Age of Onset  . Sickle cell anemia Other   . Heart disease Mother   . Heart disease Father     History  Substance Use Topics  . Smoking  status: Never Smoker   . Smokeless tobacco: Current User    Types: Snuff  . Alcohol Use: No    OB History    Grav Para Term Preterm Abortions TAB SAB Ect Mult Living                  Review of Systems  Constitutional: Negative for fever and chills.  HENT: Negative for neck pain and neck stiffness.   Eyes: Negative for pain.  Respiratory: Positive for cough. Negative for shortness of breath.   Cardiovascular: Positive for chest pain.  Gastrointestinal: Negative for abdominal pain.  Genitourinary: Negative for dysuria.  Musculoskeletal: Negative for back pain.  Skin: Negative for rash.  Neurological: Negative for headaches.  All other systems reviewed and are negative.    Allergies  Peanuts and Lisinopril  Home Medications   Current Outpatient Rx  Name Route Sig Dispense Refill  . ACETAMINOPHEN 500 MG PO TABS Oral Take 1,000 mg by mouth every 6 (six) hours as needed. For pain     . ALBUTEROL SULFATE HFA 108 (90 BASE) MCG/ACT IN AERS Inhalation Inhale 2 puffs into the lungs every 6 (six) hours as needed. For shortness of breath 1 Inhaler 6  . AMLODIPINE BESYLATE 10 MG PO TABS Oral Take 1 tablet (10 mg total) by mouth daily. 90 tablet 3  .  ASPIRIN 81 MG PO CHEW Oral Chew 81 mg by mouth daily.      Marland Kitchen CARVEDILOL 25 MG PO TABS Oral Take 1 tablet (25 mg total) by mouth 2 (two) times daily. 180 tablet 3  . FLUTICASONE PROPIONATE 50 MCG/ACT NA SUSP Nasal Place 2 sprays into the nose daily as needed. For nasal congestion    . FLUTICASONE PROPIONATE (INHAL) 100 MCG/BLIST IN AEPB Inhalation Inhale 1 puff into the lungs 2 (two) times daily. 60 each 6  . GLIPIZIDE 5 MG PO TABS Oral Take 1 tablet (5 mg total) by mouth 2 (two) times daily before a meal. 60 tablet 6  . INDOMETHACIN 50 MG PO CAPS Oral Take 50 mg by mouth every 8 (eight) hours as needed. For gout Take for 1 week at least.    . LOSARTAN POTASSIUM-HCTZ 50-12.5 MG PO TABS Oral Take 1 tablet by mouth daily. 90 tablet 3  .  METFORMIN HCL 500 MG PO TABS Oral Take 500 mg by mouth 2 (two) times daily with a meal.    . PATADAY 0.2 % OP SOLN Both Eyes Place 1 drop into both eyes daily as needed. For itchy eyes    . POLYETHYLENE GLYCOL 3350 PO POWD Oral Take 17 g by mouth daily as needed. For constipation. Mix in 8 oz glass of water.    Marland Kitchen RANITIDINE HCL 150 MG PO CAPS Oral Take 1 capsule (150 mg total) by mouth 2 (two) times daily. 180 capsule 6  . ROSUVASTATIN CALCIUM 20 MG PO TABS Oral Take 1 tablet (20 mg total) by mouth at bedtime. 90 tablet 6  . TAMOXIFEN CITRATE 20 MG PO TABS Oral Take 1 tablet (20 mg total) by mouth daily. 90 tablet 3    Bilateral Breast Cancer  . TRAMADOL HCL 50 MG PO TABS Oral Take 50 mg by mouth every 8 (eight) hours as needed. For pain    . METFORMIN HCL 500 MG PO TABS Oral Take 1 tablet (500 mg total) by mouth 2 (two) times daily with a meal. 60 tablet 11  . TRAMADOL HCL 50 MG PO TABS Oral Take 1 tablet (50 mg total) by mouth every 6 (six) hours as needed for pain. Maximum dose= 8 tablets per day 90 tablet 2    BP 116/42  Pulse 67  Temp(Src) 100.1 F (37.8 C) (Oral)  Resp 13  SpO2 100%  Physical Exam  Constitutional: She is oriented to person, place, and time. She appears well-developed and well-nourished.  HENT:  Head: Normocephalic and atraumatic.  Eyes: Conjunctivae and EOM are normal. Pupils are equal, round, and reactive to light.  Neck: Trachea normal. Neck supple. No thyromegaly present.  Cardiovascular: Normal rate, regular rhythm, S1 normal, S2 normal and normal pulses.     No systolic murmur is present   No diastolic murmur is present  Pulses:      Radial pulses are 2+ on the right side, and 2+ on the left side.  Pulmonary/Chest: Effort normal and breath sounds normal. She has no wheezes. She has no rhonchi. She has no rales.       Left chest wall severe tenderness with light palpation - is reproducible. No rash or crepitus  Abdominal: Soft. Normal appearance and bowel  sounds are normal. There is no tenderness. There is no CVA tenderness and negative Murphy's sign.  Musculoskeletal:       BLE:s Calves nontender, no cords or erythema, negative Homans sign  Neurological: She is alert and oriented  to person, place, and time. She has normal strength. No cranial nerve deficit or sensory deficit. GCS eye subscore is 4. GCS verbal subscore is 5. GCS motor subscore is 6.  Skin: Skin is warm and dry. No rash noted. She is not diaphoretic.  Psychiatric: Her speech is normal.       Cooperative and appropriate    ED Course  Procedures (including critical care time)  Labs Reviewed  CBC - Abnormal; Notable for the following:    Hemoglobin 9.6 (*)    HCT 29.4 (*)    MCV 69.5 (*)    MCH 22.7 (*)    RDW 15.6 (*)    All other components within normal limits  POCT I-STAT, CHEM 8 - Abnormal; Notable for the following:    Glucose, Bld 134 (*)    Hemoglobin 10.5 (*)    HCT 31.0 (*)    All other components within normal limits  POCT I-STAT TROPONIN I   Dg Chest 2 View  03/25/2012  *RADIOLOGY REPORT*  Clinical Data: Cough.  Left chest pain.  Wheezing.  Shortness of breath.  CHEST - 2 VIEW  Comparison: 03/22/2012  Findings: Thoracic spondylosis noted with mild tortuosity of the thoracic aorta.  Mild cardiomegaly is present.  Degenerative right AC joint arthropathy noted.  Minimal lingular scarring noted.  No pleural effusion noted. Borderline right hilar fullness appears vascular.  IMPRESSION:  1.  Mild cardiomegaly. 2.  A specific cause for the patient's left chest pain and shortness of breath is not observed on conventional radiography. 3.  Minimal lingular scarring.  Original Report Authenticated By: Dellia Cloud, M.D.    Patient given IV morphine for pain. Labs and x-ray obtained and reviewed as above. Given age and risk factors, Avelox initiated and medicine consult obtained. Case discussed as above with family practice resident who agrees ED evaluation and  admission.   MDM   Productive cough with left chest wall pain likely related injury 2/2 coughing. No hypoxia. Afebrile in emergency department. Requiring multiple rounds of IV morphine for pain control and appears very uncomfortable. Family Practice team evaluated and decision was made to admit patient        Sunnie Nielsen, MD 03/26/12 [phone]

## 2012-03-26 NOTE — ED Notes (Signed)
Pt placed on 2L O2 N/C per Dr Dierdre Highman orders. Will continue to monitor.

## 2012-03-26 NOTE — Progress Notes (Signed)
PT Cancellation Note  Treatment cancelled today due to pt fatigued because she had been up all night.  Will see tomorrow.  Boni Maclellan 03/26/2012, 2:20 PM  [employer] PT [phone]

## 2012-03-26 NOTE — ED Notes (Signed)
Pt refused to allow me to start IV. Pt requested IV team to start IV and draw labs. IV team called and made aware. Will continue to monitor.

## 2012-03-26 NOTE — H&P (Signed)
Family Medicine Teaching Service Attending Note  I interviewed and examined patient [name] and reviewed their tests and x-rays.  I discussed with Dr. Alvester Morin and reviewed their note for today.  I agree with their assessment and plan.     Additionally  Currently drowsy but easily awakens and is orinted x 3 Dry cough but no shortness of breath does have pain on left side with cough and palpation No evidence of cardiac ischemia, unlikely to have pulmonary emboli given good O2 sats and since is associated with cough Agree with treatment for pneumonia despite no definite infiltrate on chest xray Wean O2.  If any hypoxia would check d dimer to rule out pulmonary emboli Phy Therapy eval for ambulation

## 2012-03-26 NOTE — ED Notes (Signed)
Admitting at bedside 

## 2012-03-26 NOTE — H&P (Signed)
Family Medicine Teaching [facility] Admission History and Physical  [name] Medical record number: [id number] Date of birth: 02-Sep-1924 Age: 78 y.o. Gender: female  Primary Care Provider: Clementeen Graham, MD, MD  Chief Complaint: Productive cough and left-sided chest wall pain  History of Present Illness: [name] is a 78 y.o. year old female presenting with a five day history of worsening productive cough and left sided chest pain. This is associated with dyspnea. [name] was evaluated for this problem in the ED on 5/10 and was diagnosed with viral pneumonia. She was treated with albuterol for bronchoconstriction and discharged. Since that time her symptoms have worsened. Her cough is productive of yellow sputum. Her chest pain is located on her left chest wall and is exacerbated by coughing. She also feels febrile, but cannot remember what her temperature was at home. She denies any known sick contacts, hospitalization in the previous 3 months or any stays at nursing facilities.    Patient Active Problem List  Diagnoses  . DIABETES MELLITUS II, UNCOMPLICATED  . HYPERLIPIDEMIA  . ANEMIA-IRON DEFICIENCY  . NEUROPATHY  . HYPERTENSION, BENIGN SYSTEMIC  . CORONARY, ARTERIOSCLEROSIS  . Chronic systolic heart failure  . Vertebrobasilar artery syndrome  . ASTHMA, PERSISTENT  . Reflux esophagitis  . ARTHRITIS  . Hiatal hernia  . Colon polyp  . Poor circulation  . Fatigue  . History of breast cancer in female  . Urinary frequency  . Tobacco abuse  . Insomnia  . Right hand pain  . Gout flare   Past Medical History: Past Medical History  Diagnosis Date  . Breast cancer 10/2010    Invasive Ductal Carcinoma, s/p bilateral mastectomy, now on Tamoxifen. Followed by Dr Dalene Carrow.   . Diabetes mellitus   . Hypertension   . Hyperlipidemia   . Anemia     Iron def anemia  . CAD (coronary artery disease)   . CHF (congestive heart failure)     Systolic   . History  of colon cancer   . Neuropathy   . Allergy   . GERD (gastroesophageal reflux disease)   . Hiatal hernia   . Diverticulosis   . Asthma   . Arthritis   . H/O: GI bleed   . Breast cancer 2011    s/p Bilateral masectomy    Past Surgical History: Past Surgical History  Procedure Date  . Breast masectomy 10/2010    Bilateral masectomy by Dr Ezzard Standing s/p invasive ductal carcinoma.  . Cardiac  catherization 2007    Severe 3-vessel disease.  EF 20-25%.  . Esophagogastroduodenoscopy 2001    Esophageal Tear  . Total knee arthroplasty 2001    Social History: History   Social History  . Marital Status: Widowed    Spouse Name: N/A    Number of Children: N/A  . Years of Education: N/A   Social History Main Topics  . Smoking status: Never Smoker   . Smokeless tobacco: Current User    Types: Snuff  . Alcohol Use: No  . Drug Use: No  . Sexually Active: No   Other Topics Concern  . None   Social History Narrative   Widowed.  She lives with her son and has 2 daughters who help with her care.  She has a large extended family in the area who are very involved.     Family History: Family History  Problem Relation Age of Onset  . Sickle cell anemia Other   . Heart disease  Mother   . Heart disease Father     Allergies: Allergies  Allergen Reactions  . Peanuts (Nuts) Swelling  . Lisinopril Cough    Current Facility-Administered Medications  Medication Dose Route Frequency Provider Last Rate Last Dose  . acetaminophen (TYLENOL) tablet 650 mg  650 mg Oral Q6H Garnetta Buddy, MD   650 mg at 03/26/12 0410   Or  . acetaminophen (TYLENOL) suppository 650 mg  650 mg Rectal Q6H Garnetta Buddy, MD      . morphine 4 MG/ML injection 4 mg  4 mg Intravenous Once Sunnie Nielsen, MD   4 mg at 03/26/12 0054  . morphine 4 MG/ML injection 4 mg  4 mg Intravenous Once Sunnie Nielsen, MD   4 mg at 03/26/12 0240  . moxifloxacin (AVELOX) IVPB 400 mg  400 mg Intravenous Once Sunnie Nielsen, MD    400 mg at 03/26/12 0113  . ondansetron (ZOFRAN) injection 4 mg  4 mg Intravenous Once Sunnie Nielsen, MD   4 mg at 03/26/12 0054   Current Outpatient Prescriptions  Medication Sig Dispense Refill  . acetaminophen (TYLENOL) 500 MG tablet Take 1,000 mg by mouth every 6 (six) hours as needed. For pain       . albuterol (PROVENTIL HFA;VENTOLIN HFA) 108 (90 BASE) MCG/ACT inhaler Inhale 2 puffs into the lungs every 6 (six) hours as needed. For shortness of breath  1 Inhaler  6  . amLODipine (NORVASC) 10 MG tablet Take 1 tablet (10 mg total) by mouth daily.  90 tablet  3  . aspirin (ASPIRIN CHILDRENS) 81 MG chewable tablet Chew 81 mg by mouth daily.        . carvedilol (COREG) 25 MG tablet Take 1 tablet (25 mg total) by mouth 2 (two) times daily.  180 tablet  3  . fluticasone (FLONASE) 50 MCG/ACT nasal spray Place 2 sprays into the nose daily as needed. For nasal congestion      . Fluticasone Propionate, Inhal, (FLOVENT DISKUS) 100 MCG/BLIST AEPB Inhale 1 puff into the lungs 2 (two) times daily.  60 each  6  . glipiZIDE (GLUCOTROL) 5 MG tablet Take 1 tablet (5 mg total) by mouth 2 (two) times daily before a meal.  60 tablet  6  . indomethacin (INDOCIN) 50 MG capsule Take 50 mg by mouth every 8 (eight) hours as needed. For gout Take for 1 week at least.      . losartan-hydrochlorothiazide (HYZAAR) 50-12.5 MG per tablet Take 1 tablet by mouth daily.  90 tablet  3  . metFORMIN (GLUCOPHAGE) 500 MG tablet Take 500 mg by mouth 2 (two) times daily with a meal.      . PATADAY 0.2 % SOLN Place 1 drop into both eyes daily as needed. For itchy eyes      . polyethylene glycol (MIRALAX) powder Take 17 g by mouth daily as needed. For constipation. Mix in 8 oz glass of water.      . ranitidine (ZANTAC) 150 MG capsule Take 1 capsule (150 mg total) by mouth 2 (two) times daily.  180 capsule  6  . rosuvastatin (CRESTOR) 20 MG tablet Take 1 tablet (20 mg total) by mouth at bedtime.  90 tablet  6  . tamoxifen (NOLVADEX) 20 MG  tablet Take 1 tablet (20 mg total) by mouth daily.  90 tablet  3  . traMADol (ULTRAM) 50 MG tablet Take 50 mg by mouth every 8 (eight) hours as needed. For pain      .  metFORMIN (GLUCOPHAGE) 500 MG tablet Take 1 tablet (500 mg total) by mouth 2 (two) times daily with a meal.  60 tablet  11  . traMADol (ULTRAM) 50 MG tablet Take 1 tablet (50 mg total) by mouth every 6 (six) hours as needed for pain. Maximum dose= 8 tablets per day  90 tablet  2   Review Of Systems: Per HPI with the following additions: none Otherwise 12 point review of systems was performed and was unremarkable.  Physical Exam: BP 118/68  Pulse 67  Temp(Src) 98.5 F (36.9 C) (Oral)  Resp 15  Ht 5\' 2"  (1.575 m)  Wt 136 lb 6.4 oz (61.871 kg)  BMI 24.95 kg/m2  SpO2 100%  General: elderly AAF, very frail appearing, persistent productive cough, intermittently falls asleep during H&P HEENT: PERRLA, extra ocular movement intact and arcus senilis, OP clear and moist  Heart: RRR, 2/6 systolic murmur at LUSB  Chest: bilateral mastectomy  Lungs: rhonchi, expiratory slowing and increased work of breathing; the patient desaturated to 55% transiently with minimal movement, it resolved with O2 Abdomen: abdomen is soft without significant tenderness, masses, organomegaly or guarding Extremities: edema 1+ legs bilaterally and Homans sign is negative, no sign of DVT Skin:no rashes, no ecchymoses Neurology: PERLA and cranial nerves 2-12 intact  Labs and Imaging:  Results for orders placed during the hospital encounter of 03/25/12 (from the past 24 hour(s))  CBC     Status: Abnormal   Collection Time   03/26/12 12:50 AM      Component Value Range   WBC 5.1  4.0 - 10.5 (K/uL)   RBC 4.23  3.87 - 5.11 (MIL/uL)   Hemoglobin 9.6 (*) 12.0 - 15.0 (g/dL)   HCT 96.0 (*) 45.4 - 46.0 (%)   MCV 69.5 (*) 78.0 - 100.0 (fL)   MCH 22.7 (*) 26.0 - 34.0 (pg)   MCHC 32.7  30.0 - 36.0 (g/dL)   RDW 09.8 (*) 11.9 - 15.5 (%)   Platelets 182  150 - 400  (K/uL)  POCT I-STAT TROPONIN I     Status: Normal   Collection Time   03/26/12  1:05 AM      Component Value Range   Troponin i, poc 0.00  0.00 - 0.08 (ng/mL)   Comment 3           POCT I-STAT, CHEM 8     Status: Abnormal   Collection Time   03/26/12  1:07 AM      Component Value Range   Sodium 136  135 - 145 (mEq/L)   Potassium 3.5  3.5 - 5.1 (mEq/L)   Chloride 102  96 - 112 (mEq/L)   BUN 17  6 - 23 (mg/dL)   Creatinine, Ser 1.47  0.50 - 1.10 (mg/dL)   Glucose, Bld 829 (*) 70 - 99 (mg/dL)   Calcium, Ion 5.62  1.30 - 1.32 (mmol/L)   TCO2 26  0 - 100 (mmol/L)   Hemoglobin 10.5 (*) 12.0 - 15.0 (g/dL)   HCT 86.5 (*) 78.4 - 46.0 (%)   EKG: normal sinus rhythm  Chest X-ray: mild cardiomegaly, no acute cardiopulmonary disease    Assessment and Plan: KATELEEN ENCARNACION is a 78 y.o. year old female presenting with respiratory symptoms consistent with a community acquired pneumonia. Although the patient is afebrile currently and has no leukocytosis, given her age and co-morbidities, she has a PORT score of > 100. Therefore, she needs inpatient admission and treatment.   # CAP  - moxifloxacin  400 mg q 24 hours - O2 support PRN - Incentive Spirometry - Guafenesin 600 mg BID  # Asthma - continue home fluticasone BID and albuterol PRN  # Chest Wall Pain - likely intercostal muscles strain due to coughing - schedule tylenol 650 mg q 6, avoid opioids if possible 2/2 age   # CHF, Systolic - No evidence of fluid overload - continue home beta blocker, ARB, and HCTZ  #  HTN - continue home meds of beta blocker, ARB, HCTZ, CCB  # CAD - continue home ASA and statin  # DM, Type 2 - hold home metformin and glipizide - SSI (sensitive)   # Breast Cancer, Invasive Ductal Cell 2011 - continue home tamoxifen   # FENGI - KVO IVF and encourage PO; regular diet; PPI for GERD  # PPX: lovenox   # Disposition: Admit to Family Medicine Teaching Service under Dr.  Deirdre Priest --------------------------------------------------------------------------------------------------------------------------------------------  PGY-3 Addendum: Pt seen and examined and agree with above. Please see excellent R1 note for full details.  Briefly, this is a 74 -year-old female with multiple medical problems presenting with cough, pleuritic chest pain, mild increased work of breathing x5 days. Patient is seen twice in the past 5 days in the ED for symptoms. Patient is been formally diagnosed with bronchitis. Predominant symptom has been pleuritic chest pain. Patient denies any fevers or chills. Cough has been productive. Patient is a nonsmoker. No history of COPD or asthma. Appetite has been mildly decreased. Fluid intake is also mildly decreased. Inpatient team was asked to evaluate patient for possible admission due to the persistent pleuritic chest pain. Patient denies any sick contacts.  Physical Exam: As above  Assessment and Plan: 78 year old female with multiple medical problems presenting with bronchitis and pleuritic chest pain.  Bronchitis: We'll continue Avelox while in house. Will start on scheduled Tylenol as well as when necessary morphine for symptomatic control. Would like to avoid NSAIDs given concurrent use of an ACE inhibitor as well as a diuretic. From a respiratory standpoint, patient with a noted port score of 55 with age and gender alone which warrants admission. Pt seems overall uncomfortable appearing 2/2 to bronchitic sxs. Overall plan will be for symptomatic control currently. Respiratory status seems relatively stable with a minimal to near unnecessary O2 requirement.  CHF: Fluid status to be near volume depleted/euvolemic. No signs of volume overload on exam. Will encourage by mouth intake.  Hypertension: Currently well controlled. Will continue home medications.  Diabetes: Well-controlled. Will place on sliding scale while in house.  Iron  deficiency anemia: Hemoglobin near baseline. We'll continue to follow clinically.  FEN/GI: Heart healthy diet. Saline lock IV . Prophylaxis: Subcutaneous heparin.   Disposition: Pending further evaluation.

## 2012-03-26 NOTE — Care Management Note (Signed)
    Page 1 of 2   03/28/2012     8:47:35 AM   CARE MANAGEMENT NOTE 03/28/2012  Patient:  [name], [name]   Account Number:  [id number]  Date Initiated:  03/26/2012  Documentation initiated by:  Letha Cape  Subjective/Objective Assessment:   dx bronchitis, pna  admit- lives with family.     Action/Plan:   Anticipated DC Date:  03/28/2012   Anticipated DC Plan:  HOME W HOME HEALTH SERVICES      DC Planning Services  CM consult      [facility] Choice  HOME HEALTH   Choice offered to / List presented to:  C-4 Adult Children        HH arranged  HH-1 RN  HH-2 PT  HH-3 OT  HH-4 NURSE'S AIDE  HH-6 SOCIAL WORKER      HH agency  [facility]   Status of service:  Completed, signed off Medicare Important Message given?   (If response is "NO", the following Medicare IM given date fields will be blank) Date Medicare IM given:   Date Additional Medicare IM given:    Discharge Disposition:  HOME W HOME HEALTH SERVICES  Per UR Regulation:  Reviewed for med. necessity/level of care/duration of stay  If discussed at Long Length of Stay Meetings, dates discussed:    Comments:  03/28/12 8:45 Letha Cape RN, BSN [phone] spoke with patient she chose [place] from agency list, for hhrn, aide, pt , ot, and sw.  Referral made to [facility] notified.  Soc will begin 24-48 hrs poste discharge.  03/26/12 17:13 Letha Cape RN, BSN [phone] patient lives with family members, NCM will continue to follow for dc needs.

## 2012-03-26 NOTE — Progress Notes (Signed)
Pt taken off O2 per MD instruction.  Pt was on 2.5L nasal cannula, and is now sating 99% on room air.

## 2012-03-27 DIAGNOSIS — J159 Unspecified bacterial pneumonia: Secondary | ICD-10-CM | POA: Diagnosis not present

## 2012-03-27 LAB — GLUCOSE, CAPILLARY: Glucose-Capillary: 192 mg/dL — ABNORMAL HIGH (ref 70–99)

## 2012-03-27 MED ORDER — BENZONATATE 100 MG PO CAPS
100.0000 mg | ORAL_CAPSULE | Freq: Three times a day (TID) | ORAL | Status: DC | PRN
  Filled 2012-03-27: qty 1

## 2012-03-27 MED ORDER — MOXIFLOXACIN HCL 400 MG PO TABS: 400.0000 mg | ORAL_TABLET | Freq: Every day | ORAL | Status: DC

## 2012-03-27 MED ORDER — BENZONATATE 100 MG PO CAPS: 100.0000 mg | ORAL_CAPSULE | Freq: Three times a day (TID) | ORAL | Status: AC | PRN

## 2012-03-27 MED ORDER — ALBUTEROL SULFATE (5 MG/ML) 0.5% IN NEBU
2.5000 mg | INHALATION_SOLUTION | Freq: Two times a day (BID) | RESPIRATORY_TRACT | Status: DC
  Administered 2012-03-27: 2.5 mg via RESPIRATORY_TRACT
  Filled 2012-03-27: qty 0.5

## 2012-03-27 MED ORDER — MOXIFLOXACIN HCL 400 MG PO TABS
400.0000 mg | ORAL_TABLET | Freq: Every day | ORAL | Status: DC
  Administered 2012-03-27: 400 mg via ORAL
  Filled 2012-03-27 (×2): qty 1

## 2012-03-27 MED ORDER — ALBUTEROL SULFATE (5 MG/ML) 0.5% IN NEBU: 2.5000 mg | INHALATION_SOLUTION | RESPIRATORY_TRACT | Status: DC | PRN

## 2012-03-27 MED ORDER — GUAIFENESIN ER 600 MG PO TB12: 600.0000 mg | ORAL_TABLET | Freq: Two times a day (BID) | ORAL | Status: DC

## 2012-03-27 NOTE — Progress Notes (Signed)
FMTS Attending Note  Patient seen and examined by me today, she is reporting feeling much improved.  Not requiring supplemental oxygen at this time. Tolerating oral antibiotics.  I agree with Dr. Yetta Numbers assessment/plan for today, including discharge to home with 24 hour supervision of her family.  PT recommendations for 24 hour care noted.  Paula Compton, MD

## 2012-03-27 NOTE — Evaluation (Addendum)
Physical Therapy Evaluation Patient Details Name: [name] MRN: [id number] DOB: [birth year] Today's Date: 03/27/2012 Time:  -     PT Assessment / Plan / Recommendation Clinical Impression  [name] is 78 y/o female admitted for brochitis and PNA. PT consulted to evaluate for d/c. Today she demonstrates generalized weakness, decreased activity tolerance all affecting her balance and prior level of function and overall safety at home. Pt had a moderate loss of balance in the bathroom during out session and without me there pt would have fallen. She will benfit physical therapy in the acute setting to address these and the below deficits so as to maximize her mobility and independence therefore increasing her safety and decreasing her burden of care at next venue. At this time I would recommend ST-SNF for f/u therapies prior to d/c home as pt is the primary caregiver of her daughter and will not have appropriate assist for herself at home to adequately recover.     PT Assessment  Patient needs continued PT services    Follow Up Recommendations  Skilled nursing facility;Supervision/Assistance - 24 hour    Barriers to Discharge Decreased caregiver support      lEquipment Recommendations  Defer to next venue    Recommendations for Other Services OT consult   Frequency Min 3X/week    Precautions / Restrictions Precautions Precautions: Fall Restrictions Weight Bearing Restrictions: No        Mobility  Bed Mobility Bed Mobility: Supine to Sit;Sitting - Scoot to Edge of Bed Supine to Sit: 5: Supervision;HOB flat Sitting - Scoot to Edge of Bed: 6: Modified independent (Device/Increase time) Details for Bed Mobility Assistance: slow to rise and cues for safe technique Transfers Transfers: Sit to Stand;Stand to Sit Sit to Stand: 4: Min guard;4: Min assist;From bed;With upper extremity assist;From toilet Stand to Sit: 4: Min assist;With armrests;To chair/3-in-1;To toilet;With upper  extremity assist (with grab bar around toilet on right) Details for Transfer Assistance: definite need of upper extremity assist to bring her weight anteriorly over her BOS; immediately upon standing pt needing hand held assist to stabilize; coming up from lower surface of toilet pt needing multiple attempts to stand and minA facilitation for success; cues for hand placement and controlled descent to chair and toilet Ambulation/Gait Ambulation/Gait Assistance: 4: Min assist Ambulation Distance (Feet): 80 Feet Assistive device: 1 person hand held assist Ambulation/Gait Assistance Details: with hand held assist on left pt also reaching out for environmental supports (often unstable) to stabilize herself; slow shuffled gait, during a turn in the bathroom she experienced a moderate loss of balance posteriorly requring modA to regain her balance Gait Pattern: Trunk flexed;Shuffle    Exercises     PT Diagnosis: Difficulty walking;Generalized weakness;Abnormality of gait  PT Problem List: Decreased strength;Decreased activity tolerance;Decreased balance;Decreased mobility;Cardiopulmonary status limiting activity PT Treatment Interventions: DME instruction;Gait training;Functional mobility training;Stair training;Therapeutic activities;Therapeutic exercise;Balance training;Cognitive remediation;Patient/family education;Neuromuscular re-education   PT Goals Acute Rehab PT Goals PT Goal Formulation: With patient Time For Goal Achievement: 04/10/12 Pt will go Supine/Side to Sit: with modified independence;with HOB 0 degrees PT Goal: Supine/Side to Sit - Progress: Goal set today Pt will go Sit to Supine/Side: with modified independence;with HOB 0 degrees PT Goal: Sit to Supine/Side - Progress: Goal set today Pt will go Sit to Stand: with modified independence PT Goal: Sit to Stand - Progress: Goal set today Pt will go Stand to Sit: with modified independence PT Goal: Stand to Sit - Progress: Goal set  today Pt  will Transfer Bed to Chair/Chair to Bed: with modified independence PT Transfer Goal: Bed to Chair/Chair to Bed - Progress: Goal set today Pt will Ambulate: >150 feet;with modified independence;with least restrictive assistive device PT Goal: Ambulate - Progress: Goal set today Pt will Go Up / Down Stairs: 1-2 stairs;with modified independence;with least restrictive assistive device PT Goal: Up/Down Stairs - Progress: Goal set today Pt will Perform Home Exercise Program: Independently PT Goal: Perform Home Exercise Program - Progress: Goal set today  Visit Information  Last PT Received On: 03/27/12 Time in: 09:40 Time out: 10:22    Subjective Data  Subjective: I don't usually walk with a cane but when I go out I do Patient Stated Goal: to go to rehab and then home (pt has been taking care of her daughter and reports she just can't do it anymore)    Prior Functioning  Home Living Lives With: Son Available Help at Discharge: Family;Available 24 hours/day (son is disabled but pretty independent) Type of Home: House Home Access: Stairs to enter [employer] of Steps: 1 Entrance Stairs-Rails: Right (to enter its on the right (just a post)) Home Layout: One level Bathroom Shower/Tub: Engineer, manufacturing systems: Standard Home Adaptive Equipment: Tub transfer bench;Grab bars in shower;Hand-held shower hose;Straight cane;Walker - rolling Additional Comments: daughter will come over and help her bathe but othewise she does sponge baths; 7 daughters live close by to help her daily, 4 sons and multiple grandchildren and great grandchildren Prior Function Level of Independence: Independent (out in the community she uses a cane) Able to Take Stairs?: Yes Driving: No Vocation: Retired Comments: she has been the primary caregiver for her daughter recently, her son has been helping take care of her while she's been at the hospital Communication Communication: No  difficulties    Cognition  Overall Cognitive Status: Appears within functional limits for tasks assessed/performed Arousal/Alertness: Awake/alert Orientation Level: Appears intact for tasks assessed Behavior During Session: [facility] for tasks performed Cognition - Other Comments: great safety awareness and awareness of her capabilities    Extremity/Trunk Assessment Right Upper Extremity Assessment RUE ROM/Strength/Tone: Within functional levels RUE Sensation: WFL - Light Touch;WFL - Proprioception RUE Coordination: WFL - gross/fine motor Left Upper Extremity Assessment LUE ROM/Strength/Tone: Within functional levels LUE Sensation: WFL - Light Touch;WFL - Proprioception LUE Coordination: WFL - gross/fine motor Right Lower Extremity Assessment RLE ROM/Strength/Tone: Deficits RLE ROM/Strength/Tone Deficits: generalized weakness, difficulty getting up without significant use of her upper extremities and then unsteady requiring bilateral upper extremity support RLE Sensation: WFL - Light Touch;WFL - Proprioception RLE Coordination: WFL - gross/fine motor Left Lower Extremity Assessment LLE ROM/Strength/Tone: Deficits LLE ROM/Strength/Tone Deficits: generalized weakness, difficulty getting up without significant use of her upper extremities and then unsteady requiring bilateral upper extremity support LLE Sensation: WFL - Light Touch;WFL - Proprioception LLE Coordination: WFL - gross/fine motor Trunk Assessment Trunk Assessment: Kyphotic   Balance Static Standing Balance Static Standing - Balance Support: Right upper extremity supported Static Standing - Level of Assistance: 4: Min assist  End of Session PT - End of Session Equipment Utilized During Treatment: Gait belt Activity Tolerance: Patient tolerated treatment well;Patient limited by fatigue Patient left: in chair;with call bell/phone within reach Nurse Communication: Mobility status;Other (comment) (rec for ST-SNF)   [facility]  HELEN 03/27/2012, 10:27 AM

## 2012-03-27 NOTE — Progress Notes (Signed)
Pt.pulled her saline lock.Dr.Williamson made aware,& said it'sokay ,& he changed iv antibiotic to po.

## 2012-03-27 NOTE — Progress Notes (Signed)
Brief Progress Note  I have reviewed the note from physical therapy and appreciate all of the therapist's help. The patient prefers to go home instead of a SNF. After speaking with the patient and her daughter, [name], it is clear that [name] has 24 hours supervision within the home. Therefore, she will be discharged to home today.   [name] Mitchell, MD

## 2012-03-27 NOTE — Progress Notes (Signed)
Daily Progress Note [name]. [name], M.D., M.B.A  Family Medicine PGY-1 Pager [phone]  Subjective: Overnight - Pt lost her IV access so antibiotics switched to PO  AM - States that this morning she has "no pain in her body"; productive cough is consistent but improved with tessalon; patients states that she feels much better and is ready to go home   Objective: Vital signs in last 24 hours: Temp:  [99.1 F (37.3 C)-100.3 F (37.9 C)] 99.9 F (37.7 C) (05/15 0508) Pulse Rate:  [67-75] 67  (05/15 0508) Resp:  [16-18] 18  (05/15 0508) BP: (112-151)/(54-74) 129/61 mmHg (05/15 0508) SpO2:  [96 %-100 %] 100 % (05/15 0815) Weight change:  Last BM Date: 03/24/12  Intake/Output from previous day: 05/14 0701 - 05/15 0700 In: 340 [P.O.:340] Out: -  Intake/Output this shift:    General: elderly AAF, very frail appearing, persistent productive cough,much more alert and interactive than previous days   HEENT: PERRLA, extra ocular movement intact and arcus senilis, OP clear and moist  Heart: RRR, 2/6 systolic murmur at LUSB  Chest: bilateral mastectomy  Lungs: CTA-B, normal work of breathing except when clearing mucus  Abdomen: abdomen is soft without significant tenderness, masses, organomegaly or guarding  Extremities: edema 1+ legs bilaterally and Homans sign is negative, no sign of DVT  Skin:no rashes, no ecchymoses  Neurology: PERLA and cranial nerves 2-12 intact   Lab Results:  Basename 03/26/12 0107 03/26/12 0050  WBC -- 5.1  HGB 10.5* 9.6*  HCT 31.0* 29.4*  PLT -- 182   BMET  Basename 03/26/12 0107  NA 136  K 3.5  CL 102  CO2 --  GLUCOSE 134*  BUN 17  CREATININE 0.90  CALCIUM --   CBG (last 3)   Basename 03/27/12 0751 03/26/12 2107 03/26/12 1724  GLUCAP 164* 134* 110*      Studies/Results: Dg Chest 2 View  03/25/2012  *RADIOLOGY REPORT*  Clinical Data: Cough.  Left chest pain.  Wheezing.  Shortness of breath.  CHEST - 2 VIEW  Comparison:  03/22/2012  Findings: Thoracic spondylosis noted with mild tortuosity of the thoracic aorta.  Mild cardiomegaly is present.  Degenerative right AC joint arthropathy noted.  Minimal lingular scarring noted.  No pleural effusion noted. Borderline right hilar fullness appears vascular.  IMPRESSION:  1.  Mild cardiomegaly. 2.  A specific cause for the patient's left chest pain and shortness of breath is not observed on conventional radiography. 3.  Minimal lingular scarring.  Original Report Authenticated By: [name], M.D.    Medications: I have reviewed the patient's current medications.  Assessment/Plan: [name] is a 78 y.o. year old female presenting with respiratory symptoms consistent with a community acquired pneumonia. Although the patient is afebrile currently and has no leukocytosis, given her age and co-morbidities, she has a PORT score of > 100.   # Respiratory Infection - more likely viral bronchitis - moxifloxacin 400 mg q 24 hours (day 3), continue PO for 7 days total  - Incentive Spirometry  - Guafenesin 600 mg BID  - Tessalon PRN  # Asthma  - continue home fluticasone BID and albuterol PRN   # Chest Wall Pain - likely intercostal muscles strain due to coughing  - schedule tylenol 650 mg q 6, avoid opioids if possible 2/2 age   # CHF, Systolic - No evidence of fluid overload  - continue home beta blocker, ARB, and HCTZ   # HTN - continue home meds of  beta blocker, ARB, HCTZ, CCB   # CAD - continue home ASA and statin   # DM, Type 2 - hold home metformin and glipizide  - SSI (sensitive)  # Breast Cancer, Invasive Ductal Cell 2011 - continue home tamoxifen   # FENGI - KVO IVF and encourage PO; regular diet; PPI for GERD   # PPX: lovenox   # Code status: DNR  # Disposition: d/c today pending PT consultation      LOS: 2 days   [name] 03/27/2012, 9:49 AM

## 2012-03-27 NOTE — Discharge Summary (Signed)
Physician Discharge Summary  Patient ID: [name] MRN: [id number] DOB/AGE: 01/28/1924 78 y.o.  Admit date: 03/25/2012 Discharge date: 03/27/2012  Admission Diagnoses: Respiratory Infection  Discharge Diagnoses:  Principal Problem:  *Community acquired pneumonia Active Problems:  DIABETES MELLITUS II, UNCOMPLICATED  HYPERLIPIDEMIA  CORONARY, ARTERIOSCLEROSIS  Chronic systolic heart failure  ASTHMA, PERSISTENT  ARTHRITIS  History of breast cancer in female  Left-sided chest wall pain   Discharged Condition: stable  Hospital Course:  [name] is an 78 year old lady with asthma, CAD and CHF who presented with worsening cough and dyspnea that was most likely a viral bronchitis. She was admitted, because of a high PORT score and treated with moxifloxacin for possible community acquired pneumonia. Fortunately, she remained afebrile and had no leukocytosis. She had a transient O2 requirement that resolved. Prior to discharge, the patient was evaluated by the physical therapist who recommended SNF or 24 supervision. The patient declined SNF and had 24 supervision available. Therefore, she was discharged to complete a course of moxifloxacin. Her asthma, CAD, CHF, and breast cancer were all treated with home medications. Her diabetes was managed with sliding scale insulin.   Consults: None  Significant Diagnostic Studies:   Chest X-ray:  IMPRESSION:  1. Mild cardiomegaly.  2. A specific cause for the patient's left chest pain and  shortness of breath is not observed on conventional radiography.  3. Minimal lingular scarring.   Discharge Exam: Blood pressure 117/53, pulse 67, temperature 99.9 F (37.7 C), temperature source Oral, resp. rate 18, height 5\' 2"  (1.575 m), weight 136 lb 6.4 oz (61.871 kg), SpO2 100.00%. General: elderly AAF, very frail appearing, persistent productive cough,much more alert and interactive than previous days  HEENT: PERRLA, extra ocular movement  intact and arcus senilis, OP clear and moist  Heart: RRR, 2/6 systolic murmur at LUSB  Chest: bilateral mastectomy  Lungs: CTA-B, normal work of breathing except when clearing mucus  Abdomen: abdomen is soft without significant tenderness, masses, organomegaly or guarding  Extremities: edema 1+ legs bilaterally and Homans sign is negative, no sign of DVT  Skin:no rashes, no ecchymoses  Neurology: PERLA and cranial nerves 2-12 intact   Disposition: 01-Home or Self Care  Discharge Orders    Future Appointments: Provider: Department: Dept Phone: Center:   06/06/2012 2:30 PM Krista Blue [facility] [phone] None   06/11/2012 2:30 PM Laurice Record, MD [facility] [phone] None     Future Orders Please Complete By Expires   Diet - low sodium heart healthy      Increase activity slowly        Medication List  As of 03/27/2012 12:20 PM   TAKE these medications         acetaminophen 500 MG tablet   Commonly known as: TYLENOL   Take 1,000 mg by mouth every 6 (six) hours as needed. For pain      albuterol 108 (90 BASE) MCG/ACT inhaler   Commonly known as: PROVENTIL HFA;VENTOLIN HFA   Inhale 2 puffs into the lungs every 6 (six) hours as needed. For shortness of breath      amLODipine 10 MG tablet   Commonly known as: NORVASC   Take 1 tablet (10 mg total) by mouth daily.      ASPIRIN CHILDRENS 81 MG chewable tablet   Generic drug: aspirin   Chew 81 mg by mouth daily.      benzonatate 100 MG capsule   Commonly known as: TESSALON   Take  1 capsule (100 mg total) by mouth 3 (three) times daily as needed for cough.      carvedilol 25 MG tablet   Commonly known as: COREG   Take 1 tablet (25 mg total) by mouth 2 (two) times daily.      FLONASE 50 MCG/ACT nasal spray   Generic drug: fluticasone   Place 2 sprays into the nose daily as needed. For nasal congestion      Fluticasone Propionate (Inhal) 100 MCG/BLIST Aepb   Inhale 1 puff into the lungs 2 (two) times  daily.      glipiZIDE 5 MG tablet   Commonly known as: GLUCOTROL   Take 1 tablet (5 mg total) by mouth 2 (two) times daily before a meal.      guaiFENesin 600 MG 12 hr tablet   Commonly known as: MUCINEX   Take 1 tablet (600 mg total) by mouth 2 (two) times daily.      indomethacin 50 MG capsule   Commonly known as: INDOCIN   Take 50 mg by mouth every 8 (eight) hours as needed. For gout Take for 1 week at least.      losartan-hydrochlorothiazide 50-12.5 MG per tablet   Commonly known as: HYZAAR   Take 1 tablet by mouth daily.      metFORMIN 500 MG tablet   Commonly known as: GLUCOPHAGE   Take 1 tablet (500 mg total) by mouth 2 (two) times daily with a meal.      metFORMIN 500 MG tablet   Commonly known as: GLUCOPHAGE   Take 500 mg by mouth 2 (two) times daily with a meal.      MIRALAX powder   Generic drug: polyethylene glycol powder   Take 17 g by mouth daily as needed. For constipation. Mix in 8 oz glass of water.      moxifloxacin 400 MG tablet   Commonly known as: AVELOX   Take 1 tablet (400 mg total) by mouth daily at 6 PM.      PATADAY 0.2 % Soln   Generic drug: Olopatadine HCl   Place 1 drop into both eyes daily as needed. For itchy eyes      ranitidine 150 MG capsule   Commonly known as: ZANTAC   Take 1 capsule (150 mg total) by mouth 2 (two) times daily.      rosuvastatin 20 MG tablet   Commonly known as: CRESTOR   Take 1 tablet (20 mg total) by mouth at bedtime.      tamoxifen 20 MG tablet   Commonly known as: NOLVADEX   Take 1 tablet (20 mg total) by mouth daily.      traMADol 50 MG tablet   Commonly known as: ULTRAM   Take 1 tablet (50 mg total) by mouth every 6 (six) hours as needed for pain. Maximum dose= 8 tablets per day      traMADol 50 MG tablet   Commonly known as: ULTRAM   Take 50 mg by mouth every 8 (eight) hours as needed. For pain           Follow-up Information    Schedule an appointment as soon as possible for a visit with  Clementeen Graham, MD. (As needed)    Contact information:   [address] [place] [address] [phone]          Signed: Mat Carne 03/27/2012, 12:20 PM

## 2012-03-27 NOTE — Progress Notes (Signed)
Pt discharged to home per MD order.  IV previously removed, no access.  Discharge instructions, follow-up appointments, and prescriptions given.  All belongings sent with pt and all questions answered.  Pt transported in wheelchair by tech and traveled home with family by private vehicle.

## 2012-03-29 DIAGNOSIS — B351 Tinea unguium: Secondary | ICD-10-CM | POA: Diagnosis not present

## 2012-03-29 DIAGNOSIS — M79609 Pain in unspecified limb: Secondary | ICD-10-CM | POA: Diagnosis not present

## 2012-03-30 DIAGNOSIS — J189 Pneumonia, unspecified organism: Secondary | ICD-10-CM | POA: Diagnosis not present

## 2012-03-30 DIAGNOSIS — I2510 Atherosclerotic heart disease of native coronary artery without angina pectoris: Secondary | ICD-10-CM | POA: Diagnosis not present

## 2012-03-30 DIAGNOSIS — I5023 Acute on chronic systolic (congestive) heart failure: Secondary | ICD-10-CM | POA: Diagnosis not present

## 2012-03-30 DIAGNOSIS — R269 Unspecified abnormalities of gait and mobility: Secondary | ICD-10-CM | POA: Diagnosis not present

## 2012-03-30 DIAGNOSIS — E119 Type 2 diabetes mellitus without complications: Secondary | ICD-10-CM | POA: Diagnosis not present

## 2012-04-01 DIAGNOSIS — I2510 Atherosclerotic heart disease of native coronary artery without angina pectoris: Secondary | ICD-10-CM | POA: Diagnosis not present

## 2012-04-01 DIAGNOSIS — E119 Type 2 diabetes mellitus without complications: Secondary | ICD-10-CM | POA: Diagnosis not present

## 2012-04-01 DIAGNOSIS — R269 Unspecified abnormalities of gait and mobility: Secondary | ICD-10-CM | POA: Diagnosis not present

## 2012-04-01 DIAGNOSIS — J189 Pneumonia, unspecified organism: Secondary | ICD-10-CM | POA: Diagnosis not present

## 2012-04-01 DIAGNOSIS — I5023 Acute on chronic systolic (congestive) heart failure: Secondary | ICD-10-CM | POA: Diagnosis not present

## 2012-04-03 ENCOUNTER — Encounter: Payer: Self-pay | Admitting: Family Medicine

## 2012-04-03 ENCOUNTER — Ambulatory Visit (INDEPENDENT_AMBULATORY_CARE_PROVIDER_SITE_OTHER): Payer: Medicare Other | Admitting: Family Medicine

## 2012-04-03 VITALS — BP 140/90 | HR 90 | Ht 62.0 in | Wt 115.0 lb

## 2012-04-03 DIAGNOSIS — R634 Abnormal weight loss: Secondary | ICD-10-CM

## 2012-04-03 DIAGNOSIS — I2510 Atherosclerotic heart disease of native coronary artery without angina pectoris: Secondary | ICD-10-CM | POA: Diagnosis not present

## 2012-04-03 DIAGNOSIS — M79609 Pain in unspecified limb: Secondary | ICD-10-CM | POA: Diagnosis not present

## 2012-04-03 DIAGNOSIS — M129 Arthropathy, unspecified: Secondary | ICD-10-CM | POA: Diagnosis not present

## 2012-04-03 DIAGNOSIS — M79641 Pain in right hand: Secondary | ICD-10-CM

## 2012-04-03 DIAGNOSIS — J189 Pneumonia, unspecified organism: Secondary | ICD-10-CM | POA: Diagnosis not present

## 2012-04-03 DIAGNOSIS — R269 Unspecified abnormalities of gait and mobility: Secondary | ICD-10-CM | POA: Diagnosis not present

## 2012-04-03 DIAGNOSIS — E119 Type 2 diabetes mellitus without complications: Secondary | ICD-10-CM | POA: Diagnosis not present

## 2012-04-03 DIAGNOSIS — I5023 Acute on chronic systolic (congestive) heart failure: Secondary | ICD-10-CM | POA: Diagnosis not present

## 2012-04-03 MED ORDER — TRAMADOL HCL 50 MG PO TABS: 50.0000 mg | ORAL_TABLET | Freq: Three times a day (TID) | ORAL | Status: DC | PRN

## 2012-04-03 MED ORDER — MIRTAZAPINE 7.5 MG PO TABS: 7.5000 mg | ORAL_TABLET | Freq: Every day | ORAL | Status: DC

## 2012-04-03 NOTE — Patient Instructions (Addendum)
Thank you for coming in today. I sent in tramadol for pain.  You can take it 3x a day.  It is normal to cough after a pneumonia.  For cough you can break open the muccinex (guifenecin).  Do not break open the tessalon (Benzoate). Come back in about 1 month.  Call or go to the emergency room if you get worse, have trouble breathing, have chest pains, or palpitations.  For weight take mirtazipine at night.

## 2012-04-04 ENCOUNTER — Encounter: Payer: Self-pay | Admitting: Family Medicine

## 2012-04-04 ENCOUNTER — Telehealth: Payer: Self-pay | Admitting: Family Medicine

## 2012-04-04 DIAGNOSIS — J189 Pneumonia, unspecified organism: Secondary | ICD-10-CM | POA: Diagnosis not present

## 2012-04-04 DIAGNOSIS — R269 Unspecified abnormalities of gait and mobility: Secondary | ICD-10-CM | POA: Diagnosis not present

## 2012-04-04 DIAGNOSIS — I5023 Acute on chronic systolic (congestive) heart failure: Secondary | ICD-10-CM | POA: Diagnosis not present

## 2012-04-04 DIAGNOSIS — I2510 Atherosclerotic heart disease of native coronary artery without angina pectoris: Secondary | ICD-10-CM | POA: Diagnosis not present

## 2012-04-04 DIAGNOSIS — E119 Type 2 diabetes mellitus without complications: Secondary | ICD-10-CM | POA: Diagnosis not present

## 2012-04-04 NOTE — Assessment & Plan Note (Signed)
Patient has diffuse arthritis that is somewhat debilitating.  Her pain is reasonably well controlled with Tylenol and tramadol. Occasionally she has an arthritis flare which requires the use of Vicodin. Plan to use tramadol up to 3 times a day and scheduled Tylenol arthritis. Followup in one month as needed

## 2012-04-04 NOTE — Assessment & Plan Note (Addendum)
Patient notes a gradual several pound weight loss over the past few months.  She attributes this to decreased appetite. I feel that a trial of an appetite stimulant is reasonable. I would like to avoid Megace given her history of heart failure. Differential includes gradual loss of appetite or other more serious etiologies such as cancer.  She denies any other symptoms of metastatic disease and has a poor functional status.  I feel that followup with oncology as scheduled is reasonable in this case, and a trial of low-dose mirtazapine at night is reasonable.   We'll start a one-month followup. If patient develops other symptoms will refer to oncology sooner than July.

## 2012-04-04 NOTE — Progress Notes (Signed)
[name] is a 78 y.o. female who presents to [facility] today for   1) joint pain: Patient notes diffuse joint pain. She has pain in her right wrist left ankle and both knees.  She has been evaluated in the past and determined to be secondary to osteo-arthritis.   She is treating her pain with tramadol and Tylenol which works reasonably well.   She denies any recent falls.    2) recent pneumonia: Patient was recently discharged from the hospital with a pneumonia/  she was treated with oral antibiotics.  She feels well currently without any trouble breathing or chest pain. She does note a lingering nonproductive cough but feels well otherwise.  No fevers or chills.   3) weight loss: Patient has had a gradual weight loss over last few years. She attributes this to lack of appetite and decreased oral intake.  She does have a history of breast cancer status post mastectomy.  She is currently on tamoxifen therapy and not having any other symptoms characteristic of metastatic breast cancer such as fevers, night sweats.  She is interested in medications to increase her appetite. She has a followup appointment with oncology in 2 months.   PMH: Reviewed significant for hypertension and breast cancer History  Substance Use Topics  . Smoking status: Never Smoker   . Smokeless tobacco: Current User    Types: Snuff  . Alcohol Use: No   ROS as above  Medications reviewed. Current Outpatient Prescriptions  Medication Sig Dispense Refill  . acetaminophen (TYLENOL) 500 MG tablet Take 1,000 mg by mouth every 6 (six) hours as needed. For pain       . albuterol (PROVENTIL HFA;VENTOLIN HFA) 108 (90 BASE) MCG/ACT inhaler Inhale 2 puffs into the lungs every 6 (six) hours as needed. For shortness of breath  1 Inhaler  6  . amLODipine (NORVASC) 10 MG tablet Take 1 tablet (10 mg total) by mouth daily.  90 tablet  3  . aspirin (ASPIRIN CHILDRENS) 81 MG chewable tablet Chew 81 mg by mouth daily.        . benzonatate  (TESSALON) 100 MG capsule Take 1 capsule (100 mg total) by mouth 3 (three) times daily as needed for cough.  20 capsule  0  . carvedilol (COREG) 25 MG tablet Take 1 tablet (25 mg total) by mouth 2 (two) times daily.  180 tablet  3  . fluticasone (FLONASE) 50 MCG/ACT nasal spray Place 2 sprays into the nose daily as needed. For nasal congestion      . Fluticasone Propionate, Inhal, (FLOVENT DISKUS) 100 MCG/BLIST AEPB Inhale 1 puff into the lungs 2 (two) times daily.  60 each  6  . glipiZIDE (GLUCOTROL) 5 MG tablet Take 1 tablet (5 mg total) by mouth 2 (two) times daily before a meal.  60 tablet  6  . guaiFENesin (MUCINEX) 600 MG 12 hr tablet Take 1 tablet (600 mg total) by mouth 2 (two) times daily.  20 tablet  0  . metFORMIN (GLUCOPHAGE) 500 MG tablet Take 500 mg by mouth 2 (two) times daily with a meal.      . polyethylene glycol (MIRALAX) powder Take 17 g by mouth daily as needed. For constipation. Mix in 8 oz glass of water.      . ranitidine (ZANTAC) 150 MG capsule Take 1 capsule (150 mg total) by mouth 2 (two) times daily.  180 capsule  6  . rosuvastatin (CRESTOR) 20 MG tablet Take 1 tablet (20 mg  total) by mouth at bedtime.  90 tablet  6  . tamoxifen (NOLVADEX) 20 MG tablet Take 1 tablet (20 mg total) by mouth daily.  90 tablet  3  . traMADol (ULTRAM) 50 MG tablet Take 1 tablet (50 mg total) by mouth every 8 (eight) hours as needed for pain.  90 tablet  1  . losartan-hydrochlorothiazide (HYZAAR) 50-12.5 MG per tablet Take 1 tablet by mouth daily.  90 tablet  3  . metFORMIN (GLUCOPHAGE) 500 MG tablet Take 1 tablet (500 mg total) by mouth 2 (two) times daily with a meal.  60 tablet  11  . mirtazapine (REMERON) 7.5 MG tablet Take 1 tablet (7.5 mg total) by mouth at bedtime.  30 tablet  1  . PATADAY 0.2 % SOLN Place 1 drop into both eyes daily as needed. For itchy eyes        Exam:  BP 140/90  Pulse 90  Ht 5\' 2"  (1.575 m)  Wt 115 lb (52.164 kg)  BMI 21.03 kg/m2  SpO2 98% Gen: Well  NAD HEENT: EOMI,  MMM Lungs: CTABL Nl WOB Heart: RRR no MRG Abd: NABS, NT, ND Exts: Non edematous BL  LE, warm and well perfused. Arthritic appearing right hand and wrist and ankles bilaterally. Decreased range of motion and some tenderness. Mild synovitis.

## 2012-04-04 NOTE — Telephone Encounter (Signed)
Will forwrad message to Dr. Denyse Amass

## 2012-04-04 NOTE — Telephone Encounter (Signed)
Pt was here yesterday and she was up all night about her left foot.  Doesn't think that the 50mg  Ultram did any good and wants to increase it. Also didn't get anything for her cough.

## 2012-04-05 DIAGNOSIS — E119 Type 2 diabetes mellitus without complications: Secondary | ICD-10-CM | POA: Diagnosis not present

## 2012-04-05 DIAGNOSIS — J189 Pneumonia, unspecified organism: Secondary | ICD-10-CM | POA: Diagnosis not present

## 2012-04-05 DIAGNOSIS — R269 Unspecified abnormalities of gait and mobility: Secondary | ICD-10-CM | POA: Diagnosis not present

## 2012-04-05 DIAGNOSIS — I5023 Acute on chronic systolic (congestive) heart failure: Secondary | ICD-10-CM | POA: Diagnosis not present

## 2012-04-05 DIAGNOSIS — I2510 Atherosclerotic heart disease of native coronary artery without angina pectoris: Secondary | ICD-10-CM | POA: Diagnosis not present

## 2012-04-09 ENCOUNTER — Other Ambulatory Visit: Payer: Self-pay | Admitting: Family Medicine

## 2012-04-09 DIAGNOSIS — R269 Unspecified abnormalities of gait and mobility: Secondary | ICD-10-CM | POA: Diagnosis not present

## 2012-04-09 DIAGNOSIS — I5023 Acute on chronic systolic (congestive) heart failure: Secondary | ICD-10-CM | POA: Diagnosis not present

## 2012-04-09 DIAGNOSIS — J189 Pneumonia, unspecified organism: Secondary | ICD-10-CM | POA: Diagnosis not present

## 2012-04-09 DIAGNOSIS — E119 Type 2 diabetes mellitus without complications: Secondary | ICD-10-CM | POA: Diagnosis not present

## 2012-04-09 DIAGNOSIS — I2510 Atherosclerotic heart disease of native coronary artery without angina pectoris: Secondary | ICD-10-CM | POA: Diagnosis not present

## 2012-04-10 ENCOUNTER — Encounter (INDEPENDENT_AMBULATORY_CARE_PROVIDER_SITE_OTHER): Payer: Self-pay | Admitting: Surgery

## 2012-04-10 ENCOUNTER — Telehealth: Payer: Self-pay | Admitting: Family Medicine

## 2012-04-10 DIAGNOSIS — J189 Pneumonia, unspecified organism: Secondary | ICD-10-CM | POA: Diagnosis not present

## 2012-04-10 DIAGNOSIS — I2510 Atherosclerotic heart disease of native coronary artery without angina pectoris: Secondary | ICD-10-CM | POA: Diagnosis not present

## 2012-04-10 DIAGNOSIS — E119 Type 2 diabetes mellitus without complications: Secondary | ICD-10-CM | POA: Diagnosis not present

## 2012-04-10 DIAGNOSIS — M1990 Unspecified osteoarthritis, unspecified site: Secondary | ICD-10-CM

## 2012-04-10 DIAGNOSIS — I5023 Acute on chronic systolic (congestive) heart failure: Secondary | ICD-10-CM | POA: Diagnosis not present

## 2012-04-10 DIAGNOSIS — R269 Unspecified abnormalities of gait and mobility: Secondary | ICD-10-CM | POA: Diagnosis not present

## 2012-04-10 MED ORDER — HYDROCODONE-ACETAMINOPHEN 5-500 MG PO TABS: 1.0000 | ORAL_TABLET | Freq: Three times a day (TID) | ORAL | Status: AC | PRN

## 2012-04-10 NOTE — Telephone Encounter (Signed)
Continues to have foot pain characterized in in clinic visit the other day Tramadol is not effective. Plan to phone in Vic[facility]in to [facility].   Note routed to RN for phone in.   Advised that if her feet do not start improving that she should come in for check on Friday.

## 2012-04-10 NOTE — Telephone Encounter (Signed)
Vicodin 5-500mg  called in to [facility] per Dr. Denyse Amass.  Ileana Ladd

## 2012-04-10 NOTE — Telephone Encounter (Signed)
Addended by: Rodolph Bong on: 04/10/2012 07:58 AM   Modules accepted: Orders

## 2012-04-10 NOTE — Telephone Encounter (Signed)
Wants to start back with physical therapy - pls send referral

## 2012-04-15 ENCOUNTER — Telehealth: Payer: Self-pay | Admitting: Family Medicine

## 2012-04-15 NOTE — Telephone Encounter (Signed)
Need cough med called in for non productive cough.  [facility] to [facility]

## 2012-04-16 NOTE — Telephone Encounter (Signed)
Left message with patient's son for her to return call.Jaquin Coy, Rodena Medin

## 2012-04-16 NOTE — Telephone Encounter (Signed)
Pt will need to be seen in clinic for cough symptoms before I can call anything in.  Asking red team nurse to call pt back and schedule if needed

## 2012-04-30 ENCOUNTER — Encounter: Payer: Self-pay | Admitting: Family Medicine

## 2012-04-30 ENCOUNTER — Other Ambulatory Visit: Payer: Self-pay

## 2012-04-30 ENCOUNTER — Ambulatory Visit (INDEPENDENT_AMBULATORY_CARE_PROVIDER_SITE_OTHER): Payer: Medicare Other | Admitting: Family Medicine

## 2012-04-30 ENCOUNTER — Emergency Department (HOSPITAL_COMMUNITY)
Admission: EM | Admit: 2012-04-30 | Discharge: 2012-04-30 | Disposition: A | Discharge status: Home / Self Care | Payer: Medicare Other | Attending: Emergency Medicine | Admitting: Emergency Medicine

## 2012-04-30 ENCOUNTER — Encounter (HOSPITAL_COMMUNITY): Payer: Self-pay | Admitting: Physical Medicine and Rehabilitation

## 2012-04-30 ENCOUNTER — Emergency Department (HOSPITAL_COMMUNITY): Payer: Medicare Other

## 2012-04-30 VITALS — BP 138/73 | HR 85 | Ht 62.0 in | Wt 116.0 lb

## 2012-04-30 DIAGNOSIS — I509 Heart failure, unspecified: Secondary | ICD-10-CM | POA: Diagnosis not present

## 2012-04-30 DIAGNOSIS — Z85038 Personal history of other malignant neoplasm of large intestine: Secondary | ICD-10-CM | POA: Insufficient documentation

## 2012-04-30 DIAGNOSIS — I2510 Atherosclerotic heart disease of native coronary artery without angina pectoris: Secondary | ICD-10-CM | POA: Diagnosis not present

## 2012-04-30 DIAGNOSIS — E119 Type 2 diabetes mellitus without complications: Secondary | ICD-10-CM | POA: Diagnosis not present

## 2012-04-30 DIAGNOSIS — J984 Other disorders of lung: Secondary | ICD-10-CM | POA: Diagnosis not present

## 2012-04-30 DIAGNOSIS — N390 Urinary tract infection, site not specified: Secondary | ICD-10-CM | POA: Diagnosis not present

## 2012-04-30 DIAGNOSIS — J45909 Unspecified asthma, uncomplicated: Secondary | ICD-10-CM | POA: Diagnosis not present

## 2012-04-30 DIAGNOSIS — K219 Gastro-esophageal reflux disease without esophagitis: Secondary | ICD-10-CM | POA: Insufficient documentation

## 2012-04-30 DIAGNOSIS — R5383 Other fatigue: Secondary | ICD-10-CM

## 2012-04-30 DIAGNOSIS — E785 Hyperlipidemia, unspecified: Secondary | ICD-10-CM | POA: Insufficient documentation

## 2012-04-30 DIAGNOSIS — R05 Cough: Secondary | ICD-10-CM

## 2012-04-30 DIAGNOSIS — R058 Other specified cough: Secondary | ICD-10-CM | POA: Insufficient documentation

## 2012-04-30 DIAGNOSIS — I10 Essential (primary) hypertension: Secondary | ICD-10-CM | POA: Insufficient documentation

## 2012-04-30 DIAGNOSIS — Z853 Personal history of malignant neoplasm of breast: Secondary | ICD-10-CM | POA: Diagnosis not present

## 2012-04-30 DIAGNOSIS — M47814 Spondylosis without myelopathy or radiculopathy, thoracic region: Secondary | ICD-10-CM | POA: Insufficient documentation

## 2012-04-30 DIAGNOSIS — R531 Weakness: Secondary | ICD-10-CM

## 2012-04-30 DIAGNOSIS — R5381 Other malaise: Secondary | ICD-10-CM | POA: Insufficient documentation

## 2012-04-30 DIAGNOSIS — R059 Cough, unspecified: Secondary | ICD-10-CM | POA: Diagnosis not present

## 2012-04-30 LAB — CBC
HCT: 32.6 % — ABNORMAL LOW (ref 36.0–46.0)
Hemoglobin: 10.4 g/dL — ABNORMAL LOW (ref 12.0–15.0)
RBC: 4.67 MIL/uL (ref 3.87–5.11)

## 2012-04-30 LAB — COMPREHENSIVE METABOLIC PANEL
ALT: 6 U/L (ref 0–35)
AST: 12 U/L (ref 0–37)
CO2: 24 mEq/L (ref 19–32)
Calcium: 11 mg/dL — ABNORMAL HIGH (ref 8.4–10.5)
Creatinine, Ser: 0.85 mg/dL (ref 0.50–1.10)
GFR calc Af Amer: 70 mL/min — ABNORMAL LOW (ref >90)
GFR calc non Af Amer: 60 mL/min — ABNORMAL LOW (ref >90)
Sodium: 140 mEq/L (ref 135–145)
Total Protein: 7.6 g/dL (ref 6.0–8.3)

## 2012-04-30 LAB — URINE MICROSCOPIC-ADD ON

## 2012-04-30 LAB — DIFFERENTIAL
Basophils Relative: 0 % (ref 0–1)
Eosinophils Relative: 1 % (ref 0–5)
Lymphocytes Relative: 35 % (ref 12–46)
Neutro Abs: 2.6 10*3/uL (ref 1.7–7.7)
Neutrophils Relative %: 57 % (ref 43–77)

## 2012-04-30 LAB — URINALYSIS, ROUTINE W REFLEX MICROSCOPIC
Glucose, UA: NEGATIVE mg/dL
Hgb urine dipstick: NEGATIVE
Specific Gravity, Urine: 1.015 (ref 1.005–1.030)
pH: 7 (ref 5.0–8.0)

## 2012-04-30 MED ORDER — NITROFURANTOIN MONOHYD MACRO 100 MG PO CAPS: 100.0000 mg | ORAL_CAPSULE | Freq: Two times a day (BID) | ORAL | Status: AC

## 2012-04-30 NOTE — ED Notes (Signed)
Patient is AOx4 and comfortable with her discharge instructions. 

## 2012-04-30 NOTE — Assessment & Plan Note (Signed)
2 days of weakness in the setting of a productive cough with a near fall.  I am concerned about electrolyte abnormalities dehydration, or cardiac arrhythmia.  Her auscultated cardiac exam is normal today.  Her neurologic exam is nonfocal.  However I am sufficiently concerned about the possibility of a fall that outpatient labs and checks x-ray tinnitus to high-risk. Plan to transfer the emergency room for observation and labs.  If admission needed family practice will be happy to admit.

## 2012-04-30 NOTE — Progress Notes (Signed)
[name] is a 78 y.o. female who presents to [facility] today for weakness and a productive cough. Patient noted onset of cough productive of white or clear sputum starting Saturday. She developed weakness yesterday and had an episode where she became dizzy and nearly fell over. She denies any injury or hitting her head. Prior to becoming dizzy she noted a headache that resolved with 3 baby aspirin. She currently denies any headache. Additionally she denies any chest pain palpitations or new dyspnea. She continues to feel significantly weak.  Additionally she denies any current fevers or chills.   PMH: Reviewed significant for coronary artery disease, breast cancer status post bilateral mastectomy on tamoxifen therapy. History  Substance Use Topics  . Smoking status: Never Smoker   . Smokeless tobacco: Current User    Types: Snuff  . Alcohol Use: No   ROS as above  Medications reviewed. Current Outpatient Prescriptions  Medication Sig Dispense Refill  . acetaminophen (TYLENOL) 500 MG tablet Take 1,000 mg by mouth every 6 (six) hours as needed. For pain       . albuterol (PROVENTIL HFA;VENTOLIN HFA) 108 (90 BASE) MCG/ACT inhaler Inhale 2 puffs into the lungs every 6 (six) hours as needed. For shortness of breath  1 Inhaler  6  . amLODipine (NORVASC) 10 MG tablet Take 1 tablet (10 mg total) by mouth daily.  90 tablet  3  . aspirin (ASPIRIN CHILDRENS) 81 MG chewable tablet Chew 81 mg by mouth daily.        . carvedilol (COREG) 25 MG tablet Take 1 tablet (25 mg total) by mouth 2 (two) times daily.  180 tablet  3  . fluticasone (FLONASE) 50 MCG/ACT nasal spray Place 2 sprays into the nose daily as needed. For nasal congestion      . Fluticasone Propionate, Inhal, (FLOVENT DISKUS) 100 MCG/BLIST AEPB Inhale 1 puff into the lungs 2 (two) times daily.  60 each  6  . glipiZIDE (GLUCOTROL) 5 MG tablet Take 1 tablet (5 mg total) by mouth 2 (two) times daily before a meal.  60 tablet  6  . guaiFENesin  (MUCINEX) 600 MG 12 hr tablet Take 1 tablet (600 mg total) by mouth 2 (two) times daily.  20 tablet  0  . losartan-hydrochlorothiazide (HYZAAR) 50-12.5 MG per tablet Take 1 tablet by mouth daily.  90 tablet  3  . metFORMIN (GLUCOPHAGE) 500 MG tablet Take 500 mg by mouth 2 (two) times daily with a meal.      . metFORMIN (GLUCOPHAGE) 500 MG tablet TAKE 1 TABLET TWICE DAILY WITH A MEAL  60 tablet  5  . mirtazapine (REMERON) 7.5 MG tablet Take 1 tablet (7.5 mg total) by mouth at bedtime.  30 tablet  1  . PATADAY 0.2 % SOLN Place 1 drop into both eyes daily as needed. For itchy eyes      . polyethylene glycol (MIRALAX) powder Take 17 g by mouth daily as needed. For constipation. Mix in 8 oz glass of water.      . ranitidine (ZANTAC) 150 MG capsule Take 1 capsule (150 mg total) by mouth 2 (two) times daily.  180 capsule  6  . rosuvastatin (CRESTOR) 20 MG tablet Take 1 tablet (20 mg total) by mouth at bedtime.  90 tablet  6  . tamoxifen (NOLVADEX) 20 MG tablet Take 1 tablet (20 mg total) by mouth daily.  90 tablet  3  . traMADol (ULTRAM) 50 MG tablet Take 1 tablet (50 mg  total) by mouth every 8 (eight) hours as needed for pain.  90 tablet  1    Exam:  BP 138/73  Pulse 85  Ht 5\' 2"  (1.575 m)  Wt 116 lb (52.617 kg)  BMI 21.22 kg/m2  SpO2 97% Gen: Well NAD HEENT: EOMI,  MMM Lungs: CTABL Nl WOB Heart: RRR no MRG Abd: NABS, NT, ND Exts: Non edematous BL  LE, warm and well perfused.  Neuro: Nonfocal normally conversant in wheelchair  No results found for this or any previous visit (from the past 72 hour(s)).

## 2012-04-30 NOTE — ED Notes (Signed)
Pt presents to department for evaluation of generalized weakness/fatigue. Was sent from [facility] for further evaluation. Ongoing x1 day. Pt also states recent sinus and chest congestion. Denies pain at the time. She is conscious alert and oriented x4. No signs of acute distress noted.

## 2012-04-30 NOTE — ED Provider Notes (Signed)
History     CSN: [id number]  Arrival date & time 04/30/12  1651   First MD Initiated Contact with Patient 04/30/12 2053      Chief Complaint  Patient presents with  . Weakness  . Fatigue    (Consider location/radiation/quality/duration/timing/severity/associated sxs/prior treatment) HPI Pt has had 2 days of fatigue and generalized weakness. States she has had URI symptoms for several weeks. Day ago developed urinary frequency and now notes urine smells foul. No fever, chills, abd pain, N/V/D, CP, SOB.  Past Medical History  Diagnosis Date  . Breast cancer 10/2010    Invasive Ductal Carcinoma, s/p bilateral mastectomy, now on Tamoxifen. Followed by Dr Dalene Carrow.   . Diabetes mellitus   . Hypertension   . Hyperlipidemia   . Anemia     Iron def anemia  . CAD (coronary artery disease)   . CHF (congestive heart failure)     Systolic   . History of colon cancer   . Neuropathy   . Allergy   . GERD (gastroesophageal reflux disease)   . Hiatal hernia   . Diverticulosis   . Asthma   . Arthritis   . H/O: GI bleed   . Breast cancer 2011    s/p Bilateral masectomy  . Shortness of breath   . Pneumonia 03/2012    Past Surgical History  Procedure Date  . Breast masectomy 10/2010    Bilateral masectomy by Dr Ezzard Standing s/p invasive ductal carcinoma.  . Cardiac  catherization 2007    Severe 3-vessel disease.  EF 20-25%.  . Esophagogastroduodenoscopy 2001    Esophageal Tear  . Total knee arthroplasty 2001  . Breast surgery     Family History  Problem Relation Age of Onset  . Sickle cell anemia Other   . Heart disease Mother   . Heart disease Father     History  Substance Use Topics  . Smoking status: Never Smoker   . Smokeless tobacco: Current User    Types: Snuff  . Alcohol Use: No    OB History    Grav Para Term Preterm Abortions TAB SAB Ect Mult Living                  Review of Systems  Constitutional: Positive for fatigue. Negative for fever and chills.    HENT: Positive for congestion, rhinorrhea and sneezing. Negative for sore throat, neck pain and neck stiffness.   Respiratory: Negative for cough, shortness of breath and wheezing.   Cardiovascular: Negative for chest pain, palpitations and leg swelling.  Gastrointestinal: Negative for nausea, vomiting, abdominal pain and diarrhea.  Genitourinary: Positive for frequency. Negative for dysuria.  Musculoskeletal: Negative for back pain.  Skin: Negative for pallor, rash and wound.  Neurological: Positive for weakness. Negative for dizziness, numbness and headaches.    Allergies  Peanuts and Lisinopril  Home Medications   Current Outpatient Rx  Name Route Sig Dispense Refill  . ACETAMINOPHEN 500 MG PO TABS Oral Take 1,000 mg by mouth every 6 (six) hours as needed. For pain     . ALBUTEROL SULFATE HFA 108 (90 BASE) MCG/ACT IN AERS Inhalation Inhale 2 puffs into the lungs every 6 (six) hours as needed. For shortness of breath 1 Inhaler 6  . AMLODIPINE BESYLATE 10 MG PO TABS Oral Take 1 tablet (10 mg total) by mouth daily. 90 tablet 3  . ASPIRIN 81 MG PO CHEW Oral Chew 81 mg by mouth daily.      Marland Kitchen CARVEDILOL 25 MG  PO TABS Oral Take 1 tablet (25 mg total) by mouth 2 (two) times daily. 180 tablet 3  . FLUTICASONE PROPIONATE 50 MCG/ACT NA SUSP Nasal Place 2 sprays into the nose daily as needed. For nasal congestion    . FLUTICASONE PROPIONATE (INHAL) 100 MCG/BLIST IN AEPB Inhalation Inhale 1 puff into the lungs 2 (two) times daily. 60 each 6  . GLIPIZIDE 5 MG PO TABS Oral Take 1 tablet (5 mg total) by mouth 2 (two) times daily before a meal. 60 tablet 6  . LOSARTAN POTASSIUM-HCTZ 50-12.5 MG PO TABS Oral Take 1 tablet by mouth daily. 90 tablet 3  . METFORMIN HCL 500 MG PO TABS Oral Take 500 mg by mouth 2 (two) times daily with a meal.    . PATADAY 0.2 % OP SOLN Both Eyes Place 1 drop into both eyes daily as needed. For itchy eyes    . RANITIDINE HCL 150 MG PO CAPS Oral Take 1 capsule (150 mg total)  by mouth 2 (two) times daily. 180 capsule 6  . ROSUVASTATIN CALCIUM 20 MG PO TABS Oral Take 1 tablet (20 mg total) by mouth at bedtime. 90 tablet 6  . TAMOXIFEN CITRATE 20 MG PO TABS Oral Take 1 tablet (20 mg total) by mouth daily. 90 tablet 3    Bilateral Breast Cancer  . NITROFURANTOIN MONOHYD MACRO 100 MG PO CAPS Oral Take 1 capsule (100 mg total) by mouth 2 (two) times daily. 14 capsule 0  . TRAMADOL HCL 50 MG PO TABS Oral Take 1 tablet (50 mg total) by mouth every 8 (eight) hours as needed for pain. 90 tablet 1    BP 174/69  Pulse 77  Temp 98.3 F (36.8 C) (Oral)  Resp 12  SpO2 98%  Physical Exam  Nursing note and vitals reviewed. Constitutional: She is oriented to person, place, and time. She appears well-developed and well-nourished. No distress.  HENT:  Head: Normocephalic and atraumatic.  Mouth/Throat: Oropharynx is clear and moist.       No sinus tenderness. Bl TM's cleat  Eyes: EOM are normal. Pupils are equal, round, and reactive to light.  Neck: Normal range of motion. Neck supple.  Cardiovascular: Normal rate and regular rhythm.   Pulmonary/Chest: Effort normal and breath sounds normal. No respiratory distress. She has no wheezes. She has no rales. She exhibits no tenderness.  Abdominal: Soft. Bowel sounds are normal. She exhibits no mass. There is no tenderness. There is no rebound and no guarding.  Musculoskeletal: Normal range of motion. She exhibits edema. She exhibits no tenderness.       1+ bl lower ext edema  Neurological: She is alert and oriented to person, place, and time.       5/5 motor, sensation intact  Skin: Skin is warm and dry. No rash noted. No erythema.  Psychiatric: She has a normal mood and affect. Her behavior is normal.    ED Course  Procedures (including critical care time)  Labs Reviewed  COMPREHENSIVE METABOLIC PANEL - Abnormal; Notable for the following:    Glucose, Bld 205 (*)     Calcium 11.0 (*)     Alkaline Phosphatase 37 (*)      Total Bilirubin 0.2 (*)     GFR calc non Af Amer 60 (*)     GFR calc Af Amer 70 (*)     All other components within normal limits  CBC - Abnormal; Notable for the following:    Hemoglobin 10.4 (*)  HCT 32.6 (*)     MCV 69.8 (*)     MCH 22.3 (*)     RDW 16.5 (*)     All other components within normal limits  URINALYSIS, ROUTINE W REFLEX MICROSCOPIC - Abnormal; Notable for the following:    APPearance CLOUDY (*)     Protein, ur 30 (*)     Leukocytes, UA SMALL (*)     All other components within normal limits  URINE MICROSCOPIC-ADD ON - Abnormal; Notable for the following:    Squamous Epithelial / LPF FEW (*)     Bacteria, UA MANY (*)     All other components within normal limits  DIFFERENTIAL   Dg Chest 2 View  04/30/2012  *RADIOLOGY REPORT*  Clinical Data: 2-day history of generalized weakness, fatigue, cough, and chest congestion.  History of colon cancer and breast cancer.  CHEST - 2 VIEW  Comparison: Two-view chest x-ray 03/25/2012, 03/22/2012, 10/18/2011, 08/24/2011, 10/23/2010.  Findings: Cardiac silhouette normal in size, unchanged.  Thoracic aorta tortuous, unchanged.  Hilar and mediastinal contours otherwise unremarkable.  Linear scarring in the right middle lobe and lingula, unchanged.  Lungs otherwise clear.  Pulmonary vascularity normal.  Bronchovascular markings normal.  No pleural effusions.  Mild degenerative changes involving the thoracic spine. No significant interval change.  IMPRESSION: No acute cardiopulmonary disease.  Scarring in the right middle lobe and lingula.  Stable examination.  Original Report Authenticated By: Arnell Sieving, M.D.     1. UTI (lower urinary tract infection)      Date: 04/30/2012  Rate: 77  Rhythm: normal sinus rhythm  QRS Axis: normal  Intervals: normal  ST/T Wave abnormalities: nonspecific ST changes  Conduction Disutrbances:none  Narrative Interpretation:   Old EKG Reviewed: unchanged    MDM          Loren Racer, MD 04/30/12 2236

## 2012-04-30 NOTE — Assessment & Plan Note (Signed)
Patient has a productive cough in the setting of significant weakness and a near  fall yesterday.  Her vitals are normal as is her lung exam today. However I am concerned about an occult early pneumonia.  Plan to transfer to the emergency room via private vehicle for further evaluation. Recommend CBC, BMP and chest x-ray. Patient and family expressed understanding

## 2012-04-30 NOTE — Discharge Instructions (Signed)

## 2012-05-02 ENCOUNTER — Ambulatory Visit: Payer: Medicare Other | Admitting: Family Medicine

## 2012-05-16 IMAGING — CR DG CHEST 2V
1 series · 1 of 1 positions shown · non-contrast
Comparison: 03/22/2012

CLINICAL DATA: Cough.  Left chest pain.  Wheezing.  Shortness of
breath.

CHEST - 2 VIEW

[view not recorded]
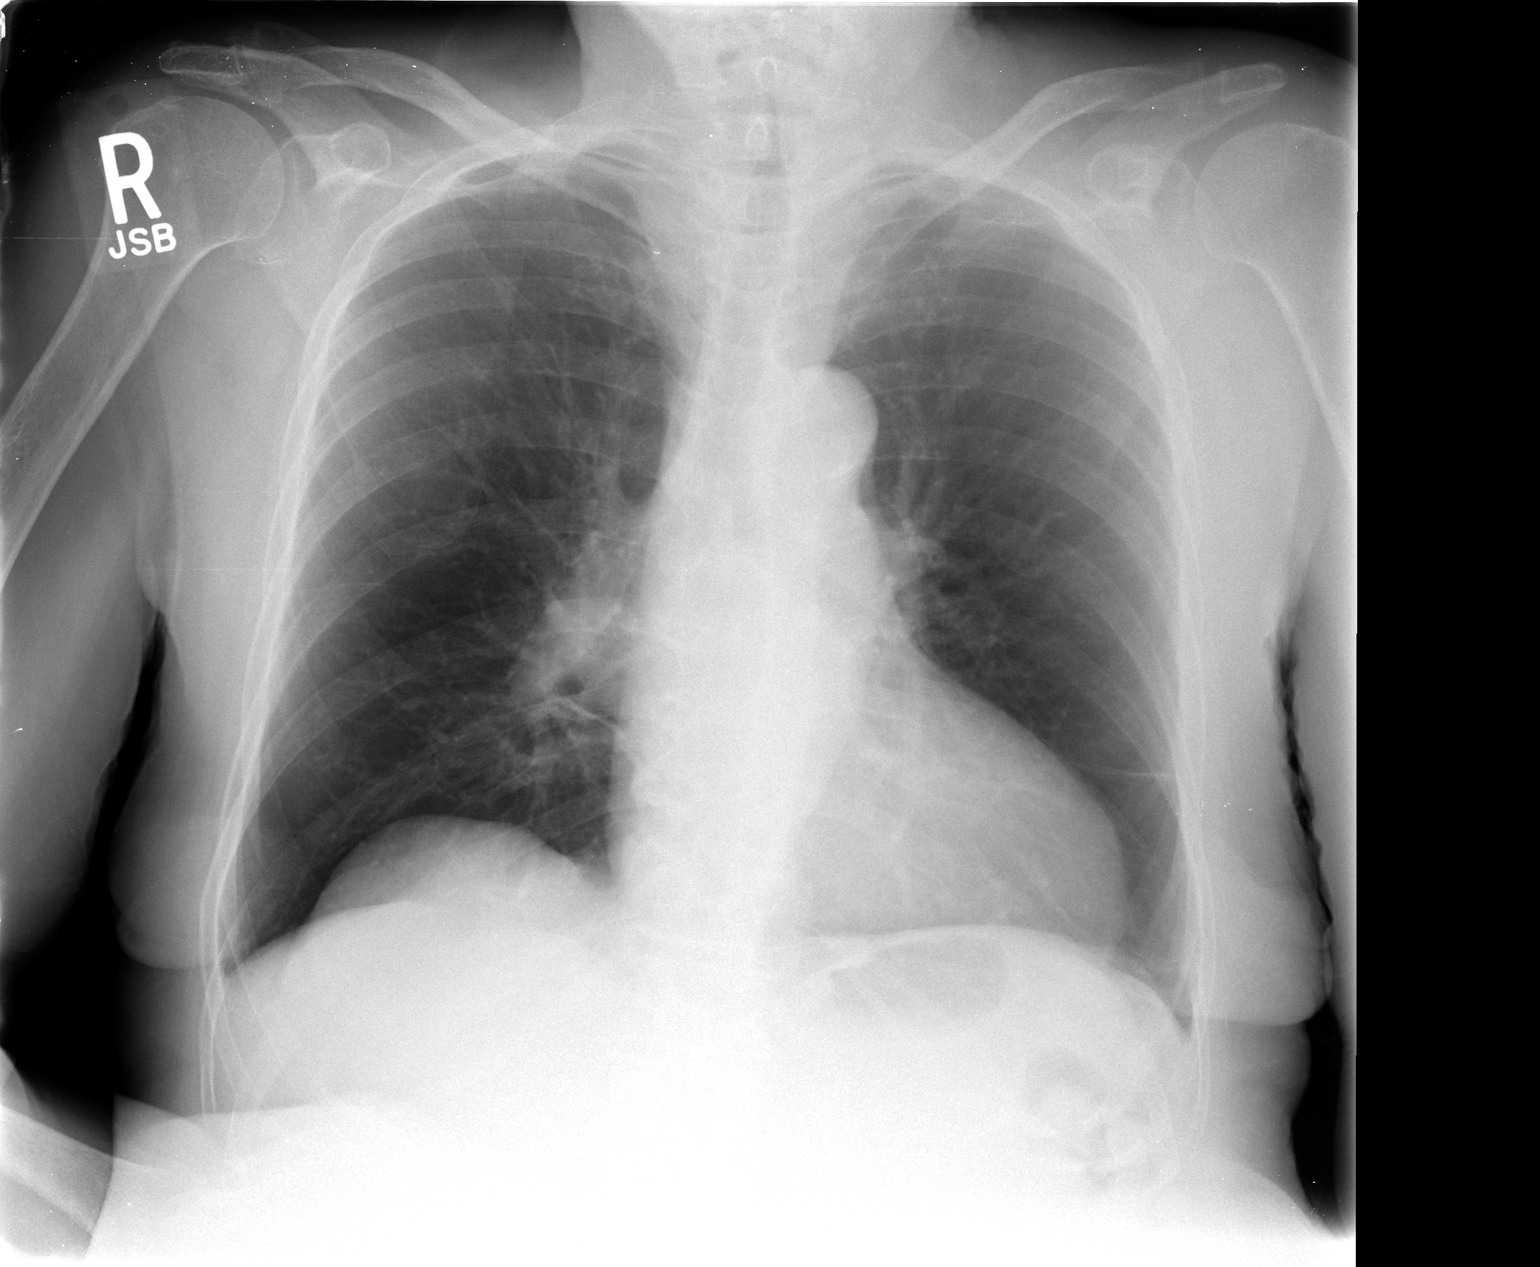

[1 of 1 positions shown; findings below may reference images not displayed]

FINDINGS: Thoracic spondylosis noted with mild tortuosity of the
thoracic aorta.

Mild cardiomegaly is present.

Degenerative right AC joint arthropathy noted.

Minimal lingular scarring noted.  No pleural effusion noted.
Borderline right hilar fullness appears vascular.
IMPRESSION: 1.  Mild cardiomegaly.
2.  A specific cause for the patient's left chest pain and
shortness of breath is not observed on conventional radiography.
3.  Minimal lingular scarring.

## 2012-05-24 ENCOUNTER — Ambulatory Visit (INDEPENDENT_AMBULATORY_CARE_PROVIDER_SITE_OTHER): Payer: Medicare Other | Admitting: Family Medicine

## 2012-05-24 VITALS — BP 170/70 | HR 76 | Temp 98.7°F | Wt 118.0 lb

## 2012-05-24 DIAGNOSIS — M546 Pain in thoracic spine: Secondary | ICD-10-CM | POA: Diagnosis not present

## 2012-05-24 DIAGNOSIS — R05 Cough: Secondary | ICD-10-CM

## 2012-05-24 DIAGNOSIS — R634 Abnormal weight loss: Secondary | ICD-10-CM

## 2012-05-24 LAB — COMPREHENSIVE METABOLIC PANEL
ALT: <8 U/L (ref 0–35)
AST: 12 U/L (ref 0–37)
Alkaline Phosphatase: 37 U/L — ABNORMAL LOW (ref 39–117)
CO2: 28 mEq/L (ref 19–32)
Creat: 0.86 mg/dL (ref 0.50–1.10)
Total Bilirubin: 0.3 mg/dL (ref 0.3–1.2)

## 2012-05-24 MED ORDER — CETIRIZINE HCL 5 MG PO TABS: 5.0000 mg | ORAL_TABLET | Freq: Every day | ORAL | Status: DC

## 2012-05-24 MED ORDER — HYDROCODONE-ACETAMINOPHEN 5-500 MG PO TABS: 1.0000 | ORAL_TABLET | Freq: Three times a day (TID) | ORAL | Status: AC | PRN

## 2012-05-24 NOTE — Progress Notes (Signed)
Patient ID: [name], female   DOB: 11-12-1924, 78 y.o.   MRN: [id number] Patient ID: [name]    DOB: Mar 07, 1924, 78 y.o.   MRN: [id number] --- Subjective:  [name] is a 78 y.o.female with h/o M2, HTN, breast cancer s/p bilateral mastectomy who presents with mid left sided back pain. Back pain: Nature: "pulling" feeling, mid back on left Onset: last Monday, no fall or heavy lifting. Hard coughing.  Time period of: 5 days Severity: severe with movement Course of symptoms over time: worst every day Aggravating: movement, deep breaths Alleviating: not moving.  Associated sx/sn: no urinary or bowel incontinence, no new weakness in lower or upper extremities, no numbness or tingling.   Cough:  Productive, For 2 weeks, getting better. No shortness of breath. Associated with rhinorrhea and itchy eyes. No sore throat.   ROS: see HPI Past Medical History: reviewed and updated medications and allergies. Social History: Tobacco: denies Objective: Filed Vitals:   05/24/12 1059  BP: 170/70  Pulse: 76  Temp: 98.7 F (37.1 C)    Physical Examination:   General appearance - alert, well appearing, and in no distress Nose - normal and patent, erythematous and congested nasal turbinates bilaterally Mouth - mucous membranes moist, pharynx normal without lesions, erythematous oropharynx Neck - supple, no significant adenopathy Chest - clear to auscultation, no wheezes, rales or rhonchi, symmetric air entry Heart - normal rate, regular rhythm, normal S1, S2, no murmurs, rubs, clicks or gallops Abdomen - soft, nontender, nondistended, no masses or organomegaly Back - no tenderness along spine or paraspinal process or tenderness along scapula or ribs. Reproducible pain with left deltoid strength testing but otherwise, 5/5 strenght in upper extremities bilaterally. 4+/5 strength in left leg and 4/5 strength in right leg (chronic in nature). Sensation to light touch intact bilaterally in all  extremities.

## 2012-05-24 NOTE — Assessment & Plan Note (Signed)
Recent CXR from June 18th did not show any acute findings. No focal findings on lung exam today and no complaint of shortness of breath making pneumonia very unlikely. This appears to be most likely related to seasonal allergies. Will start Zyrtec. This should help with cough and rhinorrhea. Hydrocortisone will also most likely help with cough

## 2012-05-24 NOTE — Assessment & Plan Note (Signed)
Back pain likely from muscle strain from acute coughing. However, in the setting of breast cancer and in the setting of previous lumbar compression fractures, will obtain plain film of T spine. Also obtain cmet for calcium and Sed rate for any evidence of acute inflammation that could suggest bone mets. Pain treatment with vicodin.  Return in 1-2 weeks for follow up.

## 2012-05-24 NOTE — Patient Instructions (Signed)
The back pain is likely a sprained muscle, but we want to make sure it isn't anything more serious.  We are getting an xray of your back as well as checking some blood work.  Take vicodin tablets every 6 to 8 hours for pain. While you take the vicodin, do not take anymore tylenol since vicodin already has tylenol in it.  Please come back in 1 to 2 weeks for follow up. If things get worst, please come in sooner.

## 2012-05-25 LAB — SEDIMENTATION RATE: Sed Rate: 17 mm/hr (ref 0–22)

## 2012-06-04 ENCOUNTER — Encounter: Payer: Self-pay | Admitting: *Deleted

## 2012-06-06 ENCOUNTER — Other Ambulatory Visit (HOSPITAL_BASED_OUTPATIENT_CLINIC_OR_DEPARTMENT_OTHER): Payer: Medicare Other | Admitting: Lab

## 2012-06-06 DIAGNOSIS — C50919 Malignant neoplasm of unspecified site of unspecified female breast: Secondary | ICD-10-CM | POA: Diagnosis not present

## 2012-06-06 LAB — CBC WITH DIFFERENTIAL/PLATELET
BASO%: 0.2 % (ref 0.0–2.0)
HCT: 31.7 % — ABNORMAL LOW (ref 34.8–46.6)
LYMPH%: 28.2 % (ref 14.0–49.7)
MCHC: 32.5 g/dL (ref 31.5–36.0)
MCV: 69.7 fL — ABNORMAL LOW (ref 79.5–101.0)
MONO#: 0.4 10*3/uL (ref 0.1–0.9)
NEUT%: 61.3 % (ref 38.4–76.8)
Platelets: 179 10*3/uL (ref 145–400)
WBC: 5.1 10*3/uL (ref 3.9–10.3)

## 2012-06-06 LAB — COMPREHENSIVE METABOLIC PANEL
AST: 10 U/L (ref 0–37)
BUN: 13 mg/dL (ref 6–23)
CO2: 27 mEq/L (ref 19–32)
Calcium: 10.3 mg/dL (ref 8.4–10.5)
Chloride: 104 mEq/L (ref 96–112)
Creatinine, Ser: 0.96 mg/dL (ref 0.50–1.10)

## 2012-06-06 LAB — CANCER ANTIGEN 27.29: CA 27.29: 15 U/mL (ref 0–39)

## 2012-06-11 ENCOUNTER — Ambulatory Visit (HOSPITAL_BASED_OUTPATIENT_CLINIC_OR_DEPARTMENT_OTHER): Payer: Medicare Other | Admitting: Hematology and Oncology

## 2012-06-11 ENCOUNTER — Encounter: Payer: Self-pay | Admitting: Hematology and Oncology

## 2012-06-11 ENCOUNTER — Telehealth: Payer: Self-pay | Admitting: Hematology and Oncology

## 2012-06-11 VITALS — BP 138/64 | HR 76 | Temp 98.5°F | Ht 62.0 in | Wt 117.5 lb

## 2012-06-11 DIAGNOSIS — C50919 Malignant neoplasm of unspecified site of unspecified female breast: Secondary | ICD-10-CM

## 2012-06-11 DIAGNOSIS — D638 Anemia in other chronic diseases classified elsewhere: Secondary | ICD-10-CM | POA: Diagnosis not present

## 2012-06-11 NOTE — Progress Notes (Signed)
This office note has been dictated.

## 2012-06-11 NOTE — Progress Notes (Signed)
IDENTIFYING STATEMENT:  Patient is a 78 year old woman with breast cancer who presents for followup.  INTERVAL HISTORY:  The patient continues on tamoxifen.  She has no current concerns.  Has no nausea, vomiting, or diarrhea.  Continues to deny bone pain.  She notes chronic right upper extremity swelling, but this is managed with lymphedema sleeve.  She also has the usual arthritic-type pain.  MEDICATIONS:  Tamoxifen 20 mg p.o. daily.  The rest of medicines reviewed and updated.  ALLERGIES:  Lisinopril and peanuts.  PHYSICAL EXAM:  The patient is a well-appearing, well-nourished woman in distress.  Vitals:  Pulse 76, blood pressure 138/64, temperature 98.5, respirations 20, weight 117.5 pounds.  HEENT:  Head is atraumatic, normocephalic.  Sclerae anicteric.  Mouth moist.  Chest:  Clear. Breasts:  Notes well-healed bilateral mastectomy surgical scars with no surrounding erythema, skin nodules or rash.  Abdomen:  Soft, nontender. No masses.  Bowel sounds present.  Extremities:  +/+ edema around ankles.  No calf tenderness.  LABORATORY DATA:  06/06/2012:  White cell count 5.1, hemoglobin 10.3, hematocrit 31.7, platelets 179.  Sodium 139, potassium 3.9, chloride 104, CO2 27, BUN 13, creatinine 0.96, glucose 221, total bilirubin 0.2, alkaline phosphatase 36, AST 10, ALT less than 8.  CA 27-29 was 15.  IMPRESSION AND PLAN:  [name] is an 78 year old woman with: 1. Status post bilateral simple mastectomies with bilateral axillary     lymph node dissection for breast cancer in 2001.  Right breast     revealed a T1c N0 and left breast reveals a T1c N1 with 1/1     positive lymph nodes.  ER was 100% and PR 27%.  HER-2/neu showed no     evidence of amplification for FISH.  The patient began tamoxifen on     11/18/2010.  She is tolerating therapy and continues to do so. 2. Anemia of chronic disease. 3. The patient follows up in 6 months'  time.    ______________________________ Laurice Record, M.D. LIO/MEDQ  D:  06/11/2012  T:  06/11/2012  Job:  [id number]

## 2012-06-11 NOTE — Telephone Encounter (Signed)
appts made and printed for pt aom °

## 2012-06-14 ENCOUNTER — Other Ambulatory Visit: Payer: Self-pay | Admitting: Family Medicine

## 2012-06-20 ENCOUNTER — Telehealth: Payer: Self-pay | Admitting: Family Medicine

## 2012-06-20 NOTE — Telephone Encounter (Signed)
Had a couple of small itchy bumps on the top of her lower leg (over femur).  She scratched it and now she has larger, red bumps over that area.  She covered it with Vaseline and says it is still red but no longer itching.  Advised her to purchase Hydrocortisone topical cream and apply to the area after washing off the Vaseline.  If area becomes larger or more red she was instructed to call us back.  Patient agreeable.

## 2012-06-20 NOTE — Telephone Encounter (Signed)
Pt's leg started itching last week and now has big red bumps on it and she is scared to scratch it because her skin is so thin.  Needs to speak with nurse to see what she can do.

## 2012-06-24 ENCOUNTER — Encounter: Payer: Self-pay | Admitting: Family Medicine

## 2012-06-24 ENCOUNTER — Ambulatory Visit (INDEPENDENT_AMBULATORY_CARE_PROVIDER_SITE_OTHER): Payer: Medicare Other | Admitting: Family Medicine

## 2012-06-24 ENCOUNTER — Telehealth: Payer: Self-pay | Admitting: Family Medicine

## 2012-06-24 VITALS — BP 148/64 | HR 74 | Ht 62.0 in | Wt 116.3 lb

## 2012-06-24 DIAGNOSIS — K5900 Constipation, unspecified: Secondary | ICD-10-CM | POA: Diagnosis not present

## 2012-06-24 MED ORDER — POLYETHYLENE GLYCOL 3350 17 GM/SCOOP PO POWD: 17.0000 g | Freq: Every day | ORAL | Status: DC | PRN

## 2012-06-24 NOTE — Assessment & Plan Note (Addendum)
From decreased PO intake and reduced use of laxative -Miralax 17 g daily, titrate as needed for goal of one bowel movement daily -Continue to stay well hydrated. She drinks 3 bottles of water daily  -Follow-up with Dr. Gwenlyn Saran in 1-2 weeks on constipation and to discuss weight loss and decreased oral intake  -She has ?stool incontinence. She was not aware that she had a small amount of stool. She is not complaining of back pain or lower extremity weakness and has good rectal tone. D/Dx: decreased rectal compliance, anal sphincter dysfunction (I am not sure what kind of treatment she had for colon cancer in the passed, but radiation may cause this), decreased rectal sensation (from diabetes? HgbA1c 01/2012 was 7-range). Will monitor for now and patient advised to notify this worsens  -Called and updated son Jonny Ruiz [phone] who is amenable to plan

## 2012-06-24 NOTE — Progress Notes (Signed)
  Subjective:    Patient ID: [name], female    DOB: 01-21-1924, 78 y.o.   MRN: [id number]  HPI # Constipation 3 weeks Takes Miralax about once a week. She used to take 3 x a week but felt like it wasn't working as well Took yesterday and able to have small bowel movement, soft, dark, very smelly  Did not have to strain She is concerned because she saw something on television that said cancer may present like this  ROS: denies blood in stool, denies pain with defecation, denies abdominal pain/nausea/vomiting, her appetite has not been good and she is requesting an appetite stimulant  Review of Systems Per HPI  Allergies, medication, past medical history reviewed.  -Iron deficiency anemia -History of breast cancer--s/p mastectomy -History of colon cancer 2003 -CAD -T2DM, not on insulin -HTN, HLD -GERD -Tobacco use -Weight loss    Objective:   Physical Exam GEN: NAD; elderly PSYCH: engaged, appropriate to simple questions ABD: soft, NT, ND, NABS Rectum: wearing Depends   Good sphincter tone   Brown stool small amount around sphincter   No stool palpated in rectal vault  Hemoccult negative x 2    Assessment & Plan:

## 2012-06-24 NOTE — Patient Instructions (Addendum)
Try the Miralax 1 capful a day.  If this is too much for you, try 1/2 capful a day. GOAL IS TO HAVE 1 BOWEL MOVEMENT DAILY.   Follow-up with Dr. Gwenlyn Saran in 1-2 weeks to talk about your appetite and to follow-up on your constipation.

## 2012-06-25 ENCOUNTER — Emergency Department (HOSPITAL_COMMUNITY): Payer: Medicare Other

## 2012-06-25 ENCOUNTER — Encounter (HOSPITAL_COMMUNITY): Payer: Self-pay | Admitting: Emergency Medicine

## 2012-06-25 ENCOUNTER — Emergency Department (HOSPITAL_COMMUNITY)
Admission: EM | Admit: 2012-06-25 | Discharge: 2012-06-25 | Disposition: A | Discharge status: Home / Self Care | Payer: Medicare Other | Attending: Emergency Medicine | Admitting: Emergency Medicine

## 2012-06-25 ENCOUNTER — Other Ambulatory Visit: Payer: Self-pay

## 2012-06-25 DIAGNOSIS — I10 Essential (primary) hypertension: Secondary | ICD-10-CM | POA: Insufficient documentation

## 2012-06-25 DIAGNOSIS — Z7982 Long term (current) use of aspirin: Secondary | ICD-10-CM | POA: Insufficient documentation

## 2012-06-25 DIAGNOSIS — Z79899 Other long term (current) drug therapy: Secondary | ICD-10-CM | POA: Diagnosis not present

## 2012-06-25 DIAGNOSIS — N390 Urinary tract infection, site not specified: Secondary | ICD-10-CM | POA: Diagnosis not present

## 2012-06-25 DIAGNOSIS — R0602 Shortness of breath: Secondary | ICD-10-CM | POA: Diagnosis not present

## 2012-06-25 DIAGNOSIS — J45909 Unspecified asthma, uncomplicated: Secondary | ICD-10-CM | POA: Diagnosis not present

## 2012-06-25 DIAGNOSIS — I509 Heart failure, unspecified: Secondary | ICD-10-CM | POA: Diagnosis not present

## 2012-06-25 DIAGNOSIS — Z8739 Personal history of other diseases of the musculoskeletal system and connective tissue: Secondary | ICD-10-CM | POA: Diagnosis not present

## 2012-06-25 DIAGNOSIS — Z853 Personal history of malignant neoplasm of breast: Secondary | ICD-10-CM | POA: Insufficient documentation

## 2012-06-25 DIAGNOSIS — E119 Type 2 diabetes mellitus without complications: Secondary | ICD-10-CM | POA: Insufficient documentation

## 2012-06-25 DIAGNOSIS — I2510 Atherosclerotic heart disease of native coronary artery without angina pectoris: Secondary | ICD-10-CM | POA: Diagnosis not present

## 2012-06-25 DIAGNOSIS — R0600 Dyspnea, unspecified: Secondary | ICD-10-CM

## 2012-06-25 DIAGNOSIS — E785 Hyperlipidemia, unspecified: Secondary | ICD-10-CM | POA: Diagnosis not present

## 2012-06-25 LAB — POCT I-STAT TROPONIN I

## 2012-06-25 LAB — CBC
Hemoglobin: 10 g/dL — ABNORMAL LOW (ref 12.0–15.0)
RBC: 4.36 MIL/uL (ref 3.87–5.11)

## 2012-06-25 LAB — URINALYSIS, ROUTINE W REFLEX MICROSCOPIC
Nitrite: NEGATIVE
Specific Gravity, Urine: 1.014 (ref 1.005–1.030)
pH: 6.5 (ref 5.0–8.0)

## 2012-06-25 LAB — URINE MICROSCOPIC-ADD ON

## 2012-06-25 LAB — BASIC METABOLIC PANEL
CO2: 24 mEq/L (ref 19–32)
Glucose, Bld: 197 mg/dL — ABNORMAL HIGH (ref 70–99)
Potassium: 3 mEq/L — ABNORMAL LOW (ref 3.5–5.1)
Sodium: 138 mEq/L (ref 135–145)

## 2012-06-25 MED ORDER — POTASSIUM CHLORIDE CRYS ER 20 MEQ PO TBCR
40.0000 meq | EXTENDED_RELEASE_TABLET | Freq: Once | ORAL | Status: DC
  Filled 2012-06-25: qty 2

## 2012-06-25 MED ORDER — POTASSIUM CHLORIDE 20 MEQ/15ML (10%) PO LIQD
40.0000 meq | Freq: Once | ORAL | Status: AC
  Administered 2012-06-25: 40 meq via ORAL
  Filled 2012-06-25: qty 30

## 2012-06-25 MED ORDER — SODIUM CHLORIDE 0.9 % IV SOLN: Freq: Once | INTRAVENOUS | Status: DC

## 2012-06-25 MED ORDER — CEPHALEXIN 500 MG PO CAPS: 500.0000 mg | ORAL_CAPSULE | Freq: Four times a day (QID) | ORAL | Status: AC

## 2012-06-25 NOTE — ED Notes (Signed)
Pt states she felt that she couldn't catch her breath today and she feels weak. Pt denies pain. Pt states she has been "coughing like crazy." Pt states she has been constipated for 3 weeks and went to the doctor yesterday and they gave her something to drink and she has been having bowel movements since. Pt states she has a history of colon and breast CA.

## 2012-06-25 NOTE — ED Provider Notes (Signed)
History     CSN: [id number]  Arrival date & time 06/25/12  1509   First MD Initiated Contact with Patient 06/25/12 1623      Chief Complaint  Patient presents with  . Shortness of Breath    (Consider location/radiation/quality/duration/timing/severity/associated sxs/prior treatment) Patient is a 78 y.o. female presenting with shortness of breath. The history is provided by the patient and a relative.  Shortness of Breath  Associated symptoms include shortness of breath.   patient here with shortness of breath and increased cough x3 days. Cough has been nonproductive and. No fever. Symptoms worse with exertion. No lower extremity swelling or edema. Rest makes her symptoms better. Denies any chest pressure. Patient does have a history of cancer  Past Medical History  Diagnosis Date  . Breast cancer 10/2010    Invasive Ductal Carcinoma, s/p bilateral mastectomy, now on Tamoxifen. Followed by Dr Dalene Carrow.   . Diabetes mellitus   . Hypertension   . Hyperlipidemia   . Anemia     Iron def anemia  . CAD (coronary artery disease)   . CHF (congestive heart failure)     Systolic   . History of colon cancer   . Neuropathy   . Allergy   . GERD (gastroesophageal reflux disease)   . Hiatal hernia   . Diverticulosis   . Asthma   . Arthritis   . H/O: GI bleed   . Breast cancer 2011    s/p Bilateral masectomy  . Shortness of breath   . Pneumonia 03/2012    Past Surgical History  Procedure Date  . Breast masectomy 10/2010    Bilateral masectomy by Dr Ezzard Standing s/p invasive ductal carcinoma.  . Cardiac  catherization 2007    Severe 3-vessel disease.  EF 20-25%.  . Esophagogastroduodenoscopy 2001    Esophageal Tear  . Total knee arthroplasty 2001  . Breast surgery     Family History  Problem Relation Age of Onset  . Sickle cell anemia Other   . Heart disease Mother   . Heart disease Father     History  Substance Use Topics  . Smoking status: Never Smoker   . Smokeless  tobacco: Current User    Types: Snuff  . Alcohol Use: No    OB History    Grav Para Term Preterm Abortions TAB SAB Ect Mult Living                  Review of Systems  Respiratory: Positive for shortness of breath.   All other systems reviewed and are negative.    Allergies  Peanuts and Lisinopril  Home Medications   Current Outpatient Rx  Name Route Sig Dispense Refill  . ACETAMINOPHEN 500 MG PO TABS Oral Take 1,000 mg by mouth every 6 (six) hours as needed. For pain     . ALBUTEROL SULFATE HFA 108 (90 BASE) MCG/ACT IN AERS Inhalation Inhale 2 puffs into the lungs every 6 (six) hours as needed. For shortness of breath 1 Inhaler 6  . AMLODIPINE BESYLATE 10 MG PO TABS Oral Take 1 tablet (10 mg total) by mouth daily. 90 tablet 3  . ASPIRIN 81 MG PO CHEW Oral Chew 81 mg by mouth daily.      Marland Kitchen CARVEDILOL 25 MG PO TABS Oral Take 1 tablet (25 mg total) by mouth 2 (two) times daily. 180 tablet 3  . CETIRIZINE HCL 5 MG PO TABS Oral Take 1 tablet (5 mg total) by mouth daily. Take at night  time as it can cause some drowsiness 30 tablet 4  . FLUTICASONE PROPIONATE 50 MCG/ACT NA SUSP Nasal Place 2 sprays into the nose daily as needed. For nasal congestion    . FLUTICASONE PROPIONATE (INHAL) 100 MCG/BLIST IN AEPB Inhalation Inhale 1 puff into the lungs 2 (two) times daily. 60 each 6  . GLIPIZIDE 5 MG PO TABS Oral Take 1 tablet (5 mg total) by mouth 2 (two) times daily before a meal. 60 tablet 6  . LOSARTAN POTASSIUM-HCTZ 50-12.5 MG PO TABS Oral Take 1 tablet by mouth daily. 90 tablet 3  . METFORMIN HCL 500 MG PO TABS Oral Take 500 mg by mouth 2 (two) times daily with a meal.    . MIRTAZAPINE 7.5 MG PO TABS Oral Take 7.5 mg by mouth at bedtime.     Marland Kitchen PATADAY 0.2 % OP SOLN Both Eyes Place 1 drop into both eyes daily as needed. For itchy eyes    . POLYETHYLENE GLYCOL 3350 PO PACK Oral Take 17 g by mouth daily as needed. For constipation.    Marland Kitchen RANITIDINE HCL 150 MG PO CAPS Oral Take 1 capsule  (150 mg total) by mouth 2 (two) times daily. 180 capsule 6  . ROSUVASTATIN CALCIUM 20 MG PO TABS Oral Take 1 tablet (20 mg total) by mouth at bedtime. 90 tablet 6  . TAMOXIFEN CITRATE 20 MG PO TABS Oral Take 1 tablet (20 mg total) by mouth daily. 90 tablet 3    Bilateral Breast Cancer  . TRAMADOL HCL 50 MG PO TABS Oral Take 1 tablet (50 mg total) by mouth every 8 (eight) hours as needed for pain. 90 tablet 1    BP 137/51  Pulse 85  Temp 98.9 F (37.2 C) (Oral)  Resp 18  SpO2 100%  Physical Exam  Nursing note and vitals reviewed. Constitutional: She is oriented to person, place, and time. She appears well-developed and well-nourished.  Non-toxic appearance. No distress.  HENT:  Head: Normocephalic and atraumatic.  Eyes: Conjunctivae, EOM and lids are normal. Pupils are equal, round, and reactive to light.  Neck: Normal range of motion. Neck supple. No tracheal deviation present. No mass present.  Cardiovascular: Normal rate, regular rhythm and normal heart sounds.  Exam reveals no gallop.   No murmur heard. Pulmonary/Chest: Effort normal and breath sounds normal. No stridor. No respiratory distress. She has no decreased breath sounds. She has no wheezes. She has no rhonchi. She has no rales.  Abdominal: Soft. Normal appearance and bowel sounds are normal. She exhibits no distension. There is no tenderness. There is no rebound and no CVA tenderness.  Musculoskeletal: Normal range of motion. She exhibits no edema and no tenderness.  Neurological: She is alert and oriented to person, place, and time. She has normal strength. No cranial nerve deficit or sensory deficit. GCS eye subscore is 4. GCS verbal subscore is 5. GCS motor subscore is 6.  Skin: Skin is warm and dry. No abrasion and no rash noted.  Psychiatric: She has a normal mood and affect. Her speech is normal and behavior is normal.    ED Course  Procedures (including critical care time)   Labs Reviewed  CBC  BASIC METABOLIC  PANEL  D-DIMER, QUANTITATIVE   Dg Chest 2 View  06/25/2012  *RADIOLOGY REPORT*  Clinical Data: Shortness of breath and weakness.  CHEST - 2 VIEW  Comparison: None.  Findings: Lung volumes are normal.  No consolidative airspace disease.  No pleural effusions.  No pneumothorax.  No pulmonary nodule or mass noted.  Pulmonary vasculature and the cardiomediastinal silhouette are within normal limits.  Lateral view demonstrates a compression fracture (likely L2) with approximately 20% loss of anterior vertebral body height (unchanged).  IMPRESSION: 1. No radiographic evidence of acute cardiopulmonary disease. 2.  Unchanged compression fracture in the upper lumbar spine, as above.  Original Report Authenticated By: Florencia Reasons, M.D.     No diagnosis found.    MDM   Date: 06/25/2012  Rate: 75  Rhythm: normal sinus rhythm  QRS Axis: normal  Intervals: normal  ST/T Wave abnormalities: nonspecific T wave changes  Conduction Disutrbances:none  Narrative Interpretation:   Old EKG Reviewed: unchanged   9:58 PM  Patient with elevated d-dimer and shortness of breath with history of breast cancer. Concern for pulmonary embolism. Spoke with patient's daughter and patient. Explained to them my concern for the above diagnoses and they have refused further testing for pulmonary embolism. The risk of sudden death was explained to them. Patient has urine with possible early infection will place patient on antibiotics for this. Patient will be discharged       Toy Baker, MD 06/25/12 2200

## 2012-06-25 NOTE — ED Notes (Signed)
IV team at bedside for x2 attempt

## 2012-06-25 NOTE — ED Notes (Signed)
Pt refusing to let anyone try to get IV access. States that she wants a PICC.

## 2012-06-28 ENCOUNTER — Ambulatory Visit (INDEPENDENT_AMBULATORY_CARE_PROVIDER_SITE_OTHER): Payer: Medicare Other | Admitting: Surgery

## 2012-06-28 VITALS — BP 142/60 | HR 72 | Temp 97.5°F | Resp 18 | Ht 62.0 in | Wt 115.0 lb

## 2012-06-28 DIAGNOSIS — Z853 Personal history of malignant neoplasm of breast: Secondary | ICD-10-CM | POA: Diagnosis not present

## 2012-06-28 DIAGNOSIS — M79609 Pain in unspecified limb: Secondary | ICD-10-CM | POA: Diagnosis not present

## 2012-06-28 DIAGNOSIS — B351 Tinea unguium: Secondary | ICD-10-CM | POA: Diagnosis not present

## 2012-06-28 NOTE — Telephone Encounter (Signed)
Error

## 2012-06-28 NOTE — Progress Notes (Signed)
[facility]  Ovidio Kin, MD,  FACS [address] [place]  [address] [place], [place]    [address] Phone:  [phone] FAX:  [phone]   Re:   [name] DOB:   [birth year] MRN:   [id number]  ASSESSMENT AND PLAN: 1.  Bilateral breast cancers  T1,N1 left breast cancer - ER - 100%, PR - 0%, Ki-67 - 33%, Her2Neu - neg.  T1, N0 right breast cancer - ER - 100%, PR - 27%, Ki-67 - 14%, Her2Neu - neg.  Bilateral mastectomies - 10/24/2010.  Sees Dr. Dalene Carrow - on tamoxifen.  Disease free at this time.  She'll see me in one year.  2.  Diabetes mellitus 3.  Coronary artery disease 4.  History of asthma 5.  Hypertension  HISTORY OF PRESENT ILLNESS: Chief Complaint  Patient presents with  . Breast Cancer Long Term Follow Up   [name] is a 78 y.o. (DOB: August 02, 1924)  AA female who is a patient of LOSQ, STEPHANIE, MD and comes to me today for follow up of bilateral breast cancers.  She has done well since I last saw her.  She has some tingling in her chest, but no pain or mass.  She has had no medical issues since I last saw her.  Her son, who is disabled, lives with her.  PHYSICAL EXAM: BP 142/60  Pulse 72  Temp 97.5 F (36.4 C)  Resp 18  Ht 5\' 2"  (1.575 m)  Wt 115 lb (52.164 kg)  BMI 21.03 kg/m2  HEENT:  Pupils equal.  Dentition good. NECK:  Supple.  No thyroid mass. LYMPH NODES:  No cervical, supraclavicular, or axillary adenopathy. BREASTS -  RIGHT:  Absent.  No mass   LEFT:  Absent.  No mass. UPPER EXTREMITIES:  No evidence of lymphedema.  Though she complains of some swelling in her right arm.   DATA REVIEWED: No new data.   Ovidio Kin, MD, FACS Office:  [phone]

## 2012-07-09 ENCOUNTER — Ambulatory Visit (INDEPENDENT_AMBULATORY_CARE_PROVIDER_SITE_OTHER): Payer: Medicare Other | Admitting: Family Medicine

## 2012-07-09 ENCOUNTER — Encounter: Payer: Self-pay | Admitting: Family Medicine

## 2012-07-09 VITALS — BP 157/74 | HR 71 | Temp 98.7°F | Wt 120.3 lb

## 2012-07-09 DIAGNOSIS — M79641 Pain in right hand: Secondary | ICD-10-CM

## 2012-07-09 DIAGNOSIS — E785 Hyperlipidemia, unspecified: Secondary | ICD-10-CM

## 2012-07-09 DIAGNOSIS — E119 Type 2 diabetes mellitus without complications: Secondary | ICD-10-CM

## 2012-07-09 DIAGNOSIS — R634 Abnormal weight loss: Secondary | ICD-10-CM

## 2012-07-09 DIAGNOSIS — R0789 Other chest pain: Secondary | ICD-10-CM | POA: Diagnosis not present

## 2012-07-09 DIAGNOSIS — R059 Cough, unspecified: Secondary | ICD-10-CM | POA: Diagnosis not present

## 2012-07-09 DIAGNOSIS — R05 Cough: Secondary | ICD-10-CM

## 2012-07-09 DIAGNOSIS — M79609 Pain in unspecified limb: Secondary | ICD-10-CM | POA: Diagnosis not present

## 2012-07-09 MED ORDER — TRAMADOL HCL 50 MG PO TABS: 50.0000 mg | ORAL_TABLET | Freq: Three times a day (TID) | ORAL | Status: DC | PRN

## 2012-07-09 MED ORDER — LEVOCETIRIZINE DIHYDROCHLORIDE 5 MG PO TABS: 5.0000 mg | ORAL_TABLET | Freq: Every evening | ORAL | Status: DC

## 2012-07-09 MED ORDER — ALBUTEROL SULFATE HFA 108 (90 BASE) MCG/ACT IN AERS: 2.0000 | INHALATION_SPRAY | Freq: Four times a day (QID) | RESPIRATORY_TRACT | Status: DC | PRN

## 2012-07-09 MED ORDER — FLUTICASONE PROPIONATE 50 MCG/ACT NA SUSP: 2.0000 | Freq: Every day | NASAL | Status: DC | PRN

## 2012-07-09 NOTE — Patient Instructions (Addendum)
For the breast pain, I think it is probably from muscle pain. You can take tramadol 50mg  every 8 to 12 hours for the pain. The nausea could be from taking too many tramadol. It could also be from post nasal drip from allergies. I refilled your flonase for that. Please make an appointment for the lab for cholesterol and potassium check. Please be fasting after midnight (no food or drink other than water).  Keep doing what you're doing for the diabetes.   I'd like to see you back in 1 month to follow up on the blood pressure.

## 2012-07-10 ENCOUNTER — Other Ambulatory Visit: Payer: Medicare Other

## 2012-07-10 DIAGNOSIS — R0789 Other chest pain: Secondary | ICD-10-CM | POA: Insufficient documentation

## 2012-07-10 DIAGNOSIS — E119 Type 2 diabetes mellitus without complications: Secondary | ICD-10-CM | POA: Diagnosis not present

## 2012-07-10 LAB — LIPID PANEL
Total CHOL/HDL Ratio: 3.1 Ratio
VLDL: 18 mg/dL (ref 0–40)

## 2012-07-10 LAB — BASIC METABOLIC PANEL
BUN: 13 mg/dL (ref 6–23)
Potassium: 3.9 mEq/L (ref 3.5–5.3)
Sodium: 140 mEq/L (ref 135–145)

## 2012-07-10 NOTE — Assessment & Plan Note (Signed)
Resolved: current weight: 120.3 from 115lb on 8/16. Patient reports improved appetite.

## 2012-07-10 NOTE — Progress Notes (Signed)
BMP AND FLP DONE TODAY [name] 

## 2012-07-10 NOTE — Progress Notes (Signed)
Patient ID: [name], female   DOB: October 08, 1924, 78 y.o.   MRN: [id number] Patient ID: [name]    DOB: [birth year], 78 y.o.   MRN: [id number] --- Subjective:  [name] is a 78 y.o.female who presents for follow up of diabetes as well as complain of right breast pain, nausea and vomiting. - breast pain: started yesterday. Mostly on left side but also on right side at times. Worst with movement and pressing on it. She took 2 tramadol yesterday, after which she felt nauseous and had episode of non bloody emesis. Tramadol did however help with the pain. Pain is intermittent, sharp, not associated with shortness of breath or difficulty breathing. Improved today. Felt a little dizzy as well. Has been coughing up phlegm this morning but has not had other episode of emesis. - DM: CBG's between 70's and 150's before breakfast. She did have a couple of episodes of hypoglycemia when in the low 70's which responded well to orange juice. No more than once a month. Takes glipizide 5 bid and metformin 500mg  bid.   ROS: see HPI Past Medical History: reviewed and updated medications and allergies. Social History: Tobacco: chews snuff.   Objective: Filed Vitals:   07/09/12 1423  BP: 157/74  Pulse: 71  Temp: 98.7 F (37.1 C)    Physical Examination:   General appearance - alert, well appearing, and in no distress Nose - mildly congested and erythematous nasal turbinates Mouth - mucous membranes moist, pharynx normal without lesions Neck - supple, no significant adenopathy Chest - clear to auscultation, no wheezes, rales or rhonchi, symmetric air entry Reproducible tenderness to palpation along lateral aspect of left breast bone. Some noted on right as well. No masses appreciated.  Heart - normal rate, regular rhythm, normal S1, S2, no murmurs, rubs, clicks or gallops Abdomen - soft, nontender, nondistended, no masses or organomegaly Extremities - peripheral pulses normal, no pedal edema, no clubbing or  cyanosis

## 2012-07-10 NOTE — Assessment & Plan Note (Signed)
Likely musculoskeletal. Doesn't appear cardiac given the location of pain as well as the reproducibility of pain with palpation and the improvement with ultram. Recommended patient take 1 tablet of ultram every 8-12 hours for pain. If pain not improved in 2 weeks, patient to return. No evidence of mass in the area.  Nausea and vomiting likely associated with taking 2 doses of ultram. Recommended that patient take only 1 since she has tolerated this in the past.

## 2012-07-10 NOTE — Assessment & Plan Note (Signed)
Repeat fasting lipid this week.

## 2012-07-10 NOTE — Assessment & Plan Note (Signed)
A1C in clinic today: 7.2. Continue current management. Watch for hypoglycemia.

## 2012-07-24 ENCOUNTER — Encounter: Payer: Self-pay | Admitting: Family Medicine

## 2012-08-06 ENCOUNTER — Ambulatory Visit (INDEPENDENT_AMBULATORY_CARE_PROVIDER_SITE_OTHER): Payer: Medicare Other | Admitting: Family Medicine

## 2012-08-06 ENCOUNTER — Encounter: Payer: Self-pay | Admitting: Family Medicine

## 2012-08-06 VITALS — BP 156/71 | HR 84 | Ht 62.0 in | Wt 122.0 lb

## 2012-08-06 DIAGNOSIS — I10 Essential (primary) hypertension: Secondary | ICD-10-CM

## 2012-08-06 DIAGNOSIS — M545 Low back pain, unspecified: Secondary | ICD-10-CM | POA: Diagnosis not present

## 2012-08-06 DIAGNOSIS — Z23 Encounter for immunization: Secondary | ICD-10-CM | POA: Diagnosis not present

## 2012-08-06 DIAGNOSIS — R05 Cough: Secondary | ICD-10-CM

## 2012-08-06 DIAGNOSIS — R059 Cough, unspecified: Secondary | ICD-10-CM | POA: Diagnosis not present

## 2012-08-06 MED ORDER — LOSARTAN POTASSIUM-HCTZ 100-12.5 MG PO TABS: 1.0000 | ORAL_TABLET | Freq: Every day | ORAL | Status: DC

## 2012-08-06 MED ORDER — GLIPIZIDE 5 MG PO TABS: 5.0000 mg | ORAL_TABLET | Freq: Two times a day (BID) | ORAL | Status: DC

## 2012-08-06 NOTE — Patient Instructions (Addendum)
For the back pain, try Aspecreme to apply to the back. If that doesn't work, we can try tramadol at the next visit.  Come back in 2-3 weeks to check the blood pressure on the new medicine and we'll do blood work at that time

## 2012-08-07 DIAGNOSIS — M545 Low back pain: Secondary | ICD-10-CM | POA: Insufficient documentation

## 2012-08-07 NOTE — Assessment & Plan Note (Addendum)
On amlodipine 10mg  daily, coreg 25mg  bid and losartan/HCTZ 50/12.5. BP at 156/71 today which has been consistent in her latest visits. Will increase losartan to 100mg  and see how she does. Patient to return for follow up in a couple of weeks with blood work at the time.

## 2012-08-07 NOTE — Progress Notes (Signed)
Patient ID: [name], female   DOB: [birth year], 78 y.o.   MRN: [id number] --- Subjective:  [name] is a 78 y.o.female with h/o HTN, DM who presents for follow up.  - DM: CBG's at home have been in the low 100's fasting. 150's post prandial. No CBGs lower than 80. No symptoms of hypoglycemia. No skin ulcers. Taking metformin 500mg  bid and glipizide 5mg  bid.  - HTN: doesn't check BP at home. Compliant with medication. Taking losartan/hctz 50/12.5, coreg 25mg  bid. No chest pain, no shortness of breath, no lower extremity swelling.  - lower back pain: occurs if she walks a lot during the day. Worst at night when laying down. Has been using patch that her daughter gave her, but she doesn't remember the name. Pain is worst with bending, better with standing straight. Has occurred for a couple of weeks now. No injury or change in activity that prompted it. No bowel or urinary incontinence. No lower extremity weakness. - burning at right side of chest along scar line from lymph node removal.  - cough: improved with antihistamine.   ROS: see HPI Past Medical History: reviewed and updated medications and allergies. Social History: Tobacco: chews tobacco  Objective: Filed Vitals:   08/06/12 1427  BP: 156/71  Pulse: 84    Physical Examination:   General appearance - alert, well appearing, and in no distress Chest - clear to auscultation, no wheezes, rales or rhonchi, symmetric air entry Heart - normal rate, regular rhythm, normal S1, S2, 3/6 systolic murmur best heard at right sternal border  Abdomen - soft, nontender, nondistended, no masses or organomegaly Extremities - peripheral pulses normal, no pedal edema, no clubbing or cyanosis, sensation to light touch intact Back - no tenderness to palpation along lumbar, thoracic or cervical spine, no paraspinal muscle tenderness, 4+/5 strength in lower extremities.

## 2012-08-07 NOTE — Assessment & Plan Note (Signed)
Improved with antihistamine.

## 2012-08-07 NOTE — Assessment & Plan Note (Signed)
No bony tenderness on exam. No focal neuro deficits. Will conservatively manage with aspercreme and see how she does. If pain not resolved at next visit in 2-3 weeks will obtain imaging, given patient's h/o breast cancer.

## 2012-08-16 IMAGING — CR DG CHEST 2V
2 series · 2 of 2 positions shown · non-contrast
Comparison: None.

CLINICAL DATA: Shortness of breath and weakness.

CHEST - 2 VIEW

[w chest lat]
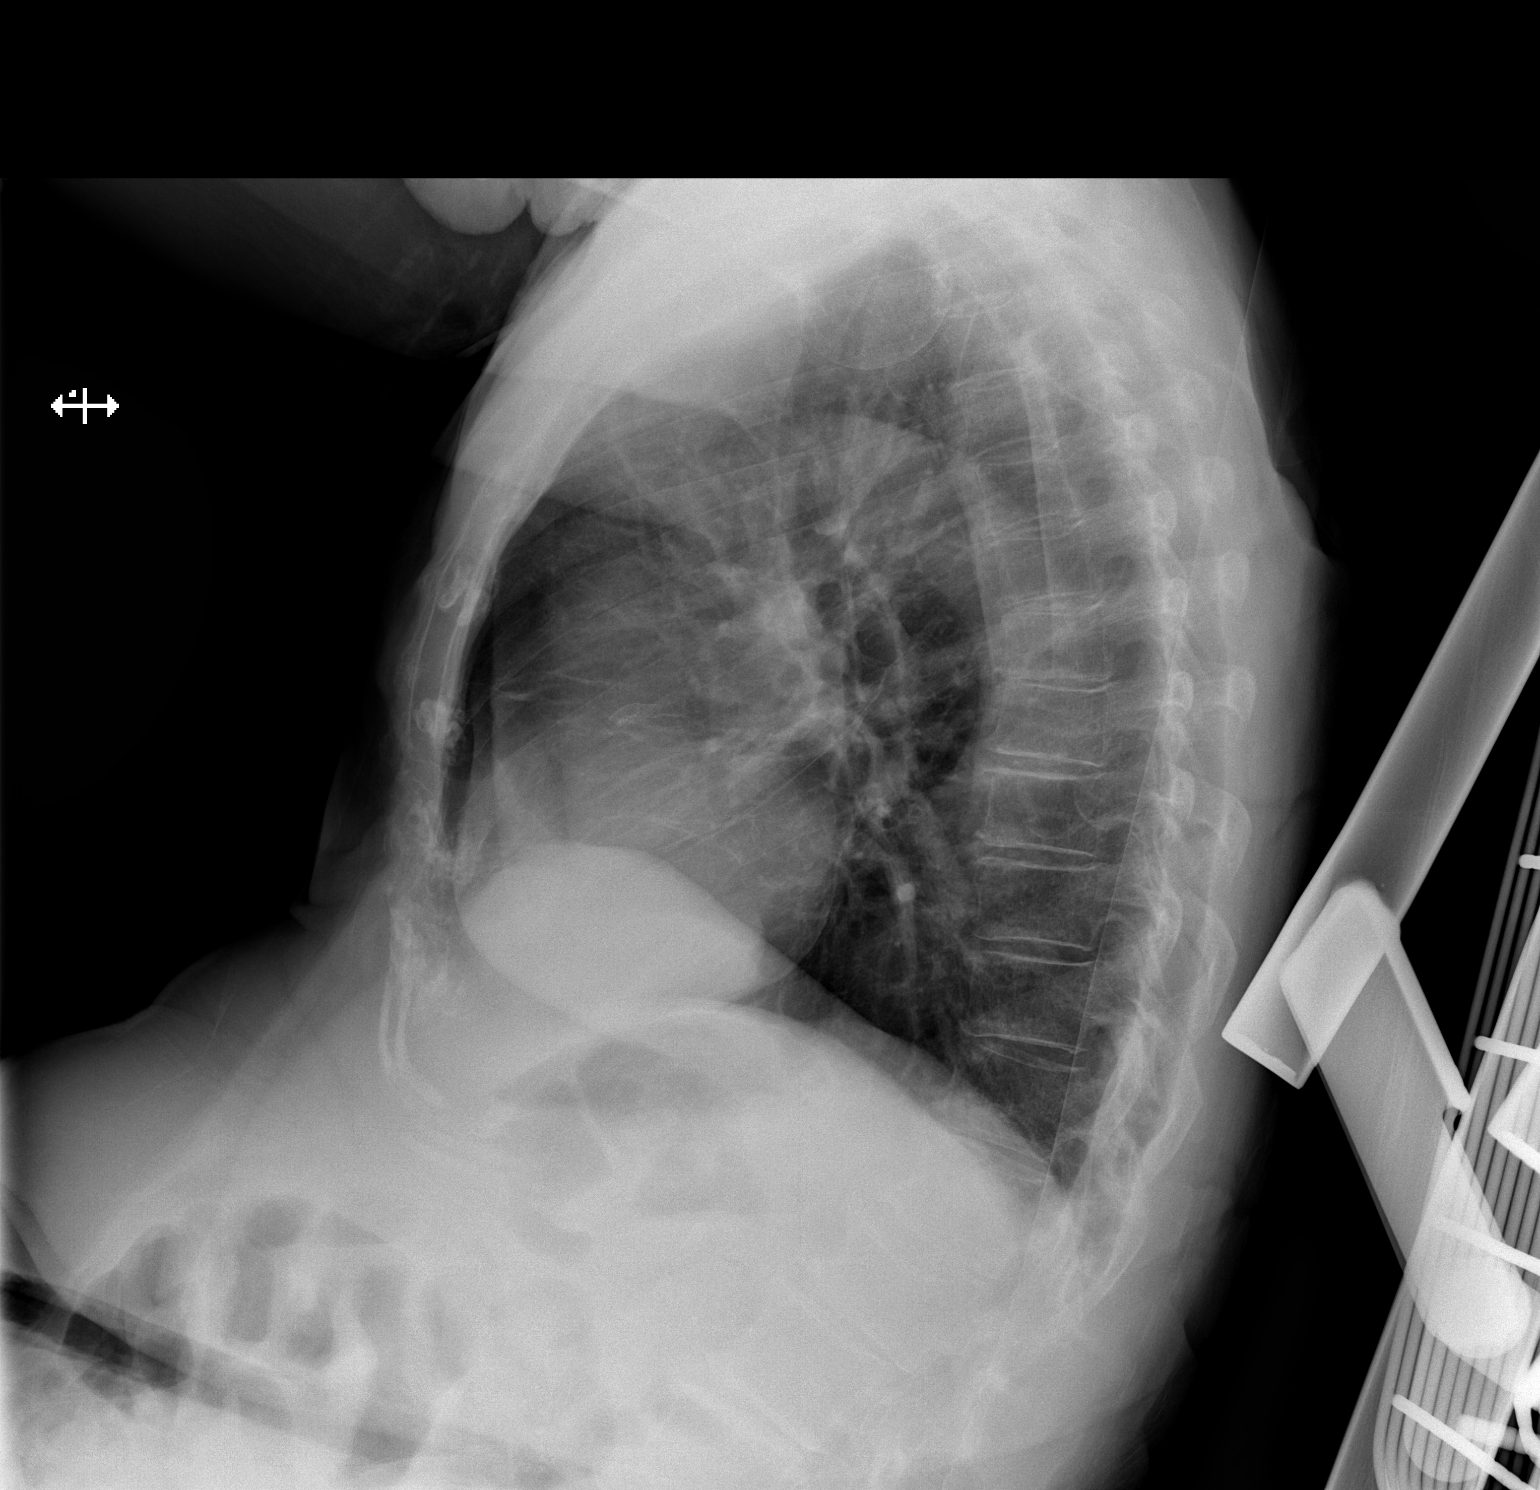

[x chest ap]
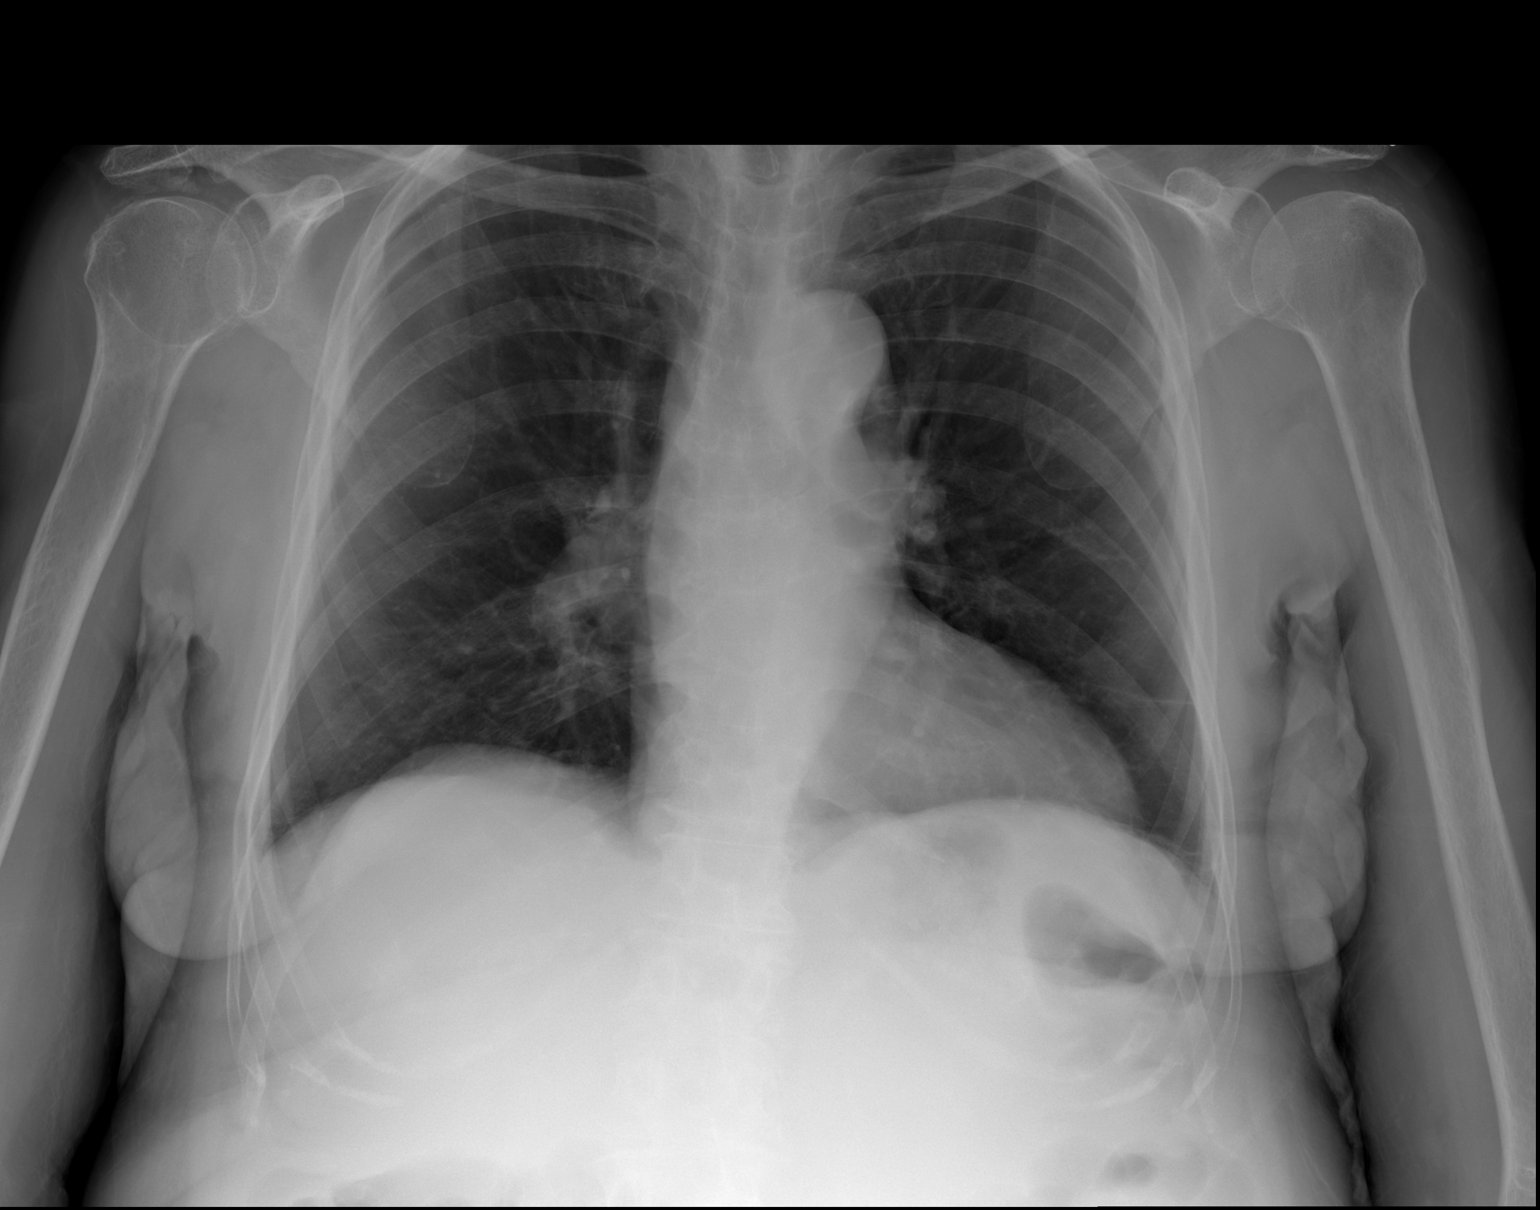

[2 of 2 positions shown; findings below may reference images not displayed]

FINDINGS: Lung volumes are normal.  No consolidative airspace
disease.  No pleural effusions.  No pneumothorax.  No pulmonary
nodule or mass noted.  Pulmonary vasculature and the
cardiomediastinal silhouette are within normal limits.  Lateral
view demonstrates a compression fracture (likely L2) with
approximately 20% loss of anterior vertebral body height
(unchanged).
IMPRESSION: 1. No radiographic evidence of acute cardiopulmonary disease.
2.  Unchanged compression fracture in the upper lumbar spine, as
above.

## 2012-08-21 ENCOUNTER — Telehealth: Payer: Self-pay | Admitting: Family Medicine

## 2012-08-21 MED ORDER — COLCHICINE 0.6 MG PO TABS: 1.2000 mg | ORAL_TABLET | Freq: Once | ORAL | Status: DC

## 2012-08-21 MED ORDER — COLCHICINE 0.6 MG PO TABS: 0.6000 mg | ORAL_TABLET | Freq: Once | ORAL | Status: DC

## 2012-08-21 NOTE — Telephone Encounter (Signed)
Patient informed Dr. Gwenlyn Saran has received message and will address message shortly after clinic.  Gaylene Brooks, RN

## 2012-08-21 NOTE — Telephone Encounter (Signed)
Called Ms Vocke who reports that she is having a gouty flare and having intense pain, not able to put weight on her foot. Called in colchicine 1.2mg  once followed by 0.6mg  1 hour after as well as vicodin for pain. Patient to follow up with me next week.

## 2012-08-21 NOTE — Telephone Encounter (Signed)
Pt is calling again about the pain in her foot - wants to know if the doctor can call something in for her

## 2012-08-21 NOTE — Telephone Encounter (Signed)
Pt has gout in her foot and needs something called in for her pain

## 2012-08-21 NOTE — Telephone Encounter (Signed)
Left foot pain due to gout.  Rates pain at 10/10.  Could not sleep last night due to burning.  Unable to walk on foot to come in for office visit.  Took Tylenol for pain without relief.  Has been treated for gout in past and requesting to have a stronger med Rx'd for pain.  Note routed to Dr. Gwenlyn Saran (in clinic this afternoon).  Will call patient back.  Gaylene Brooks, RN

## 2012-09-13 ENCOUNTER — Encounter: Payer: Self-pay | Admitting: Family Medicine

## 2012-09-13 ENCOUNTER — Ambulatory Visit (INDEPENDENT_AMBULATORY_CARE_PROVIDER_SITE_OTHER): Payer: Medicare Other | Admitting: Family Medicine

## 2012-09-13 VITALS — BP 132/46 | HR 76 | Temp 99.4°F | Wt 120.0 lb

## 2012-09-13 DIAGNOSIS — Z658 Other specified problems related to psychosocial circumstances: Secondary | ICD-10-CM | POA: Diagnosis not present

## 2012-09-13 DIAGNOSIS — Z659 Problem related to unspecified psychosocial circumstances: Secondary | ICD-10-CM

## 2012-09-13 DIAGNOSIS — M109 Gout, unspecified: Secondary | ICD-10-CM | POA: Diagnosis not present

## 2012-09-13 MED ORDER — COLCHICINE 0.6 MG PO TABS: 0.6000 mg | ORAL_TABLET | Freq: Once | ORAL | Status: DC

## 2012-09-13 NOTE — Progress Notes (Signed)
Subjective:   1. Ankle pain-patient states woke up Monday morning and noted burning pain in her left ankle on the left side. She also noted some mild swelling. Pain increased to 8/10 gnawing pain and swelling increased throughout the week. Patient describes a strong personal history of gout. States she took colchicine for many years but never was on allopurinol. She had a flare reportedly 6 months ago requiring colchicine but was not started on allopurinol (colchicine on med list but gout not on problem list). States her pain quickly goes away with colchicine. Denies nausea/vomiting/fever/chills/fatigue/overall sick feelings. ROS-does complain of some upper back pain on right side.   2. Poor social situation-at end of visit patient mentioned moving into a nursing home at some point. I inquired why and asked if patient felt safe at home. She stated no and felt like sons/daughters were not as supportive as she would like. Stated they say mean things to her and often ignore her. She states they are willing to help her get placed somewhere and she is very interested in this. She also does not like that the son she lives with constantly is using drugs and refuses simple requests at times such as getting her some water. She does want to return home with family today but wants to talk with social work for options on next living situation.    ROS--See HPI  Past Medical History-CAD, asthma, CHF EF 25-35%, history of breast cancer s/p bilateral mastectomy Reviewed problem list.  Medications- reviewed and updated Chief complaint-noted  Objective: BP 132/46  Pulse 76  Temp 99.4 F (37.4 C) (Oral)  Wt 120 lb (54.432 kg) Gen: NAD, wheeled into room in wheelchair MSK: upper back with small muscle spasm to left or right shoulder blade very tender to palpation. Pain improved with light massage. Left lateral ankle warm to touch, swelling surrounding entire ankle. Trace edema in bilateral legs above ankle. Pain  with active and passive range of motion. Pain with palpation below lateral malleolus.    Assessment/Plan: See problem oriented charted  Advised light massage and ice to muscle spasm medial to right shoulder blade. Daughter in room and to help family.

## 2012-09-13 NOTE — Assessment & Plan Note (Signed)
Will pass along information from HPI to social work.

## 2012-09-13 NOTE — Assessment & Plan Note (Signed)
Never on allopurinol. Did not check uric acid today as often falsely low during flare. Will have patient back in 2-3 weeks to assess response to colchicine given today. Patient would likely benefit from allopurinol given fact her mobility is often affected when she gets flares and this is the second time this year (states gout flares almost always in left ankle). Would consider 1/2 regular maintenance dose when starting allopurinol given age (0.6mg  daily instead of BID). Goal uric acid would be less than 5 or 6.

## 2012-09-13 NOTE — Patient Instructions (Signed)
It appears you have a gout flare in your ankle. Take the medication as prescribed (no more than 3 pills in a day). Take 2 pills today and 1 more pill in an hour if your pain isnt improved then take 2 pills every day after that until you follow up with Dr. Gwenlyn Saran. Make sure to see her within the next 2-3 weeks. We may need to start you on a regular medication to help prevent gout.   Thanks, Dr. Durene Cal  P.S. Make sure to get those back massages and put some ice on your upper back twice a day. Follow up if not improved within a week.   Gout Gout is caused by a buildup of uric acid crystals in the joints. The crystals make your joints sore. This is like having sand in your joints. Repeat attacks are common. Gout can be treated. HOME CARE   Do not take aspirin for pain.  Only take medicine as told by your doctor.  You may use cold treatments (ice) on painful joints.  Put ice in a plastic bag.  Place a towel between your skin and the bag.  Leave the ice on for 15 to 20 minutes at a time, 3 to 4 times a day.  Rest in bed as much as possible. When in bed, keep the sheets and blankets off your sore joints.  Keep the sore joints raised (elevated).  Use crutches if your legs or ankles hurt.  Drink enough water and fluids to keep your pee (urine) clear or pale yellow. This helps your body get rid of uric acid. Do not drink alcohol.  Follow diet instructions as told by your doctor.  Keep your body at a healthy weight. GET HELP RIGHT AWAY IF:   You have a temperature by mouth above 102 F (38.9 C), not controlled by medicine.  You have watery poop (diarrhea).  You are throwing up (vomiting).  You do not feel better in 1 day, or you are getting worse.  Your joint hurts more.  You have the chills. MAKE SURE YOU:   Understand these instructions.  Will watch your condition.  Will get help right away if you are not doing well or get worse. Document Released: 08/08/2008 Document  Revised: 01/22/2012 Document Reviewed: 02/07/2010 [facility] Patient Information 2013 [place], [place].

## 2012-09-17 ENCOUNTER — Encounter: Payer: Self-pay | Admitting: Family Medicine

## 2012-09-17 ENCOUNTER — Ambulatory Visit (INDEPENDENT_AMBULATORY_CARE_PROVIDER_SITE_OTHER): Payer: Medicare Other | Admitting: Family Medicine

## 2012-09-17 ENCOUNTER — Telehealth: Payer: Self-pay | Admitting: Clinical

## 2012-09-17 VITALS — BP 135/72 | HR 75 | Temp 98.5°F | Ht 62.0 in | Wt 119.0 lb

## 2012-09-17 DIAGNOSIS — N390 Urinary tract infection, site not specified: Secondary | ICD-10-CM | POA: Diagnosis not present

## 2012-09-17 DIAGNOSIS — R300 Dysuria: Secondary | ICD-10-CM

## 2012-09-17 DIAGNOSIS — K5900 Constipation, unspecified: Secondary | ICD-10-CM | POA: Diagnosis not present

## 2012-09-17 LAB — POCT UA - MICROSCOPIC ONLY

## 2012-09-17 LAB — POCT URINALYSIS DIPSTICK
Ketones, UA: NEGATIVE
Nitrite, UA: POSITIVE
Protein, UA: 30

## 2012-09-17 MED ORDER — SENNA-DOCUSATE SODIUM 8.6-50 MG PO TABS: 1.0000 | ORAL_TABLET | Freq: Every day | ORAL | Status: DC

## 2012-09-17 MED ORDER — CEPHALEXIN 500 MG PO CAPS: 500.0000 mg | ORAL_CAPSULE | Freq: Two times a day (BID) | ORAL | Status: DC

## 2012-09-17 NOTE — Telephone Encounter (Signed)
Clinical Child psychotherapist (CSW) received a referral to assist pt in finding a SNF/ALF as pt had informed her provider that she did not feel safe at home.  CSW spoke with pt today and explored whether pt felt unsafe at home and whether she was interested in placement. Pt stated she and her family had "a talk" this morning and "a lot has changed." Pt stated her family is providing her son with the support that he needs and that pt is receiving significant support from her daughter who prepares her meals and takes her to her appointments. Pt stated she is no longer interested in placement however would consider it in the future. Pt confirmed she feels safe at home with her son and will contact her PCP/CSW if any needs arise.  Theresia Bough, MSW, Theresia Majors [phone]

## 2012-09-17 NOTE — Progress Notes (Signed)
  Subjective:    Patient ID: [name], female    DOB: 1924-01-07, 78 y.o.   MRN: [id number]  HPI [name] is a 78 y.o. female seen 09/13/12 for gout attack in left ankle. She was having back pain at that time. Her ankle is better on colchicine but back pain is same. She also c/o odor to urine. No fever or chills, nausea or vomiting. No dysuria.   Review of Systems  Constitutional: Negative for fever and chills.  Genitourinary: Negative for dysuria, urgency, frequency, hematuria, flank pain and difficulty urinating.  Musculoskeletal: Positive for back pain.       Objective:   Physical Exam  Constitutional: She is oriented to person, place, and time. No distress.       Pleasant older woman, looks stated age, somewhat frail. No acute distress, well nourished.  HENT:  Head: Normocephalic and atraumatic.  Eyes: Conjunctivae normal and EOM are normal.  Cardiovascular: Normal rate, regular rhythm and normal heart sounds.   Pulmonary/Chest: Effort normal and breath sounds normal. No respiratory distress.  Abdominal: Soft. Bowel sounds are normal. There is no tenderness. There is no rebound and no guarding.  Neurological: She is alert and oriented to person, place, and time.  Skin: Skin is warm and dry.  Psychiatric: She has a normal mood and affect.   Results for orders placed in visit on 09/17/12 (from the past 24 hour(s))  POCT URINALYSIS DIPSTICK     Status: Abnormal   Collection Time   09/17/12 10:46 AM      Component Value Range   Color, UA YELLOW     Clarity, UA CLOUDY     Glucose, UA NEG     Bilirubin, UA NEG     Ketones, UA NEG     Spec Grav, UA 1.015     Blood, UA TRACE-INTACT     pH, UA 6.0     Protein, UA 30     Urobilinogen, UA 0.2     Nitrite, UA POSITIVE     Leukocytes, UA large (3+)     Narrative:    Reflex to microscopic    POCT UA - MICROSCOPIC ONLY     Status: Abnormal   Collection Time   09/17/12 10:46 AM      Component Value Range   WBC, Ur, HPF,  POC LOADED     RBC, urine, microscopic 10-20     Bacteria, U Microscopic 3+     Epithelial cells, urine per micros 5-15        Assessment & Plan:  78 y.o. female with - UTI.  Had UTI in May E. Coli sensitive to macrobid, cephalosporins, cipro, resistant to bactrim Macrobid contraindicated with creatinine clearance <60 and calculated GFR is low, pt with DM and HTN. Will treat with Keflex. F/U if not improved after treatment.  Napoleon Form, MD

## 2012-09-17 NOTE — Patient Instructions (Addendum)
Urinary Tract Infection  Urinary tract infections (UTIs) can develop anywhere along your urinary tract. Your urinary tract is your body's drainage system for removing wastes and extra water. Your urinary tract includes two kidneys, two ureters, a bladder, and a urethra. Your kidneys are a pair of bean-shaped organs. Each kidney is about the size of your fist. They are located below your ribs, one on each side of your spine.  CAUSES  Infections are caused by microbes, which are microscopic organisms, including fungi, viruses, and bacteria. These organisms are so small that they can only be seen through a microscope. Bacteria are the microbes that most commonly cause UTIs.  SYMPTOMS   Symptoms of UTIs may vary by age and gender of the patient and by the location of the infection. Symptoms in young women typically include a frequent and intense urge to urinate and a painful, burning feeling in the bladder or urethra during urination. Older women and men are more likely to be tired, shaky, and weak and have muscle aches and abdominal pain. A fever may mean the infection is in your kidneys. Other symptoms of a kidney infection include pain in your back or sides below the ribs, nausea, and vomiting.  DIAGNOSIS  To diagnose a UTI, your caregiver will ask you about your symptoms. Your caregiver also will ask to provide a urine sample. The urine sample will be tested for bacteria and white blood cells. White blood cells are made by your body to help fight infection.  TREATMENT   Typically, UTIs can be treated with medication. Because most UTIs are caused by a bacterial infection, they usually can be treated with the use of antibiotics. The choice of antibiotic and length of treatment depend on your symptoms and the type of bacteria causing your infection.  HOME CARE INSTRUCTIONS   If you were prescribed antibiotics, take them exactly as your caregiver instructs you. Finish the medication even if you feel better after you  have only taken some of the medication.   Drink enough water and fluids to keep your urine clear or pale yellow.   Avoid caffeine, tea, and carbonated beverages. They tend to irritate your bladder.   Empty your bladder often. Avoid holding urine for long periods of time.   Empty your bladder before and after sexual intercourse.   After a bowel movement, women should cleanse from front to back. Use each tissue only once.  SEEK MEDICAL CARE IF:    You have back pain.   You develop a fever.   Your symptoms do not begin to resolve within 3 days.  SEEK IMMEDIATE MEDICAL CARE IF:    You have severe back pain or lower abdominal pain.   You develop chills.   You have nausea or vomiting.   You have continued burning or discomfort with urination.  MAKE SURE YOU:    Understand these instructions.   Will watch your condition.   Will get help right away if you are not doing well or get worse.  Document Released: 08/09/2005 Document Revised: 04/30/2012 Document Reviewed: 12/08/2011  [employer] Patient Information 2013 [employer].      Constipation, Adult  Constipation is when a person has fewer than 3 bowel movements a week; has difficulty having a bowel movement; or has stools that are dry, hard, or larger than normal. As people grow older, constipation is more common. If you try to fix constipation with medicines that make you have a bowel movement (laxatives), the problem may   get worse. Long-term laxative use may cause the muscles of the colon to become weak. A low-fiber diet, not taking in enough fluids, and taking certain medicines may make constipation worse.  CAUSES    Certain medicines, such as antidepressants, pain medicine, iron supplements, antacids, and water pills.    Certain diseases, such as diabetes, irritable bowel syndrome (IBS), thyroid disease, or depression.    Not drinking enough water.    Not eating enough fiber-rich foods.    Stress or travel.   Lack of physical activity or  exercise.   Not going to the restroom when there is the urge to have a bowel movement.   Ignoring the urge to have a bowel movement.   Using laxatives too much.  SYMPTOMS    Having fewer than 3 bowel movements a week.    Straining to have a bowel movement.    Having hard, dry, or larger than normal stools.    Feeling full or bloated.    Pain in the lower abdomen.   Not feeling relief after having a bowel movement.  DIAGNOSIS   Your caregiver will take a medical history and perform a physical exam. Further testing may be done for severe constipation. Some tests may include:    A barium enema X-ray to examine your rectum, colon, and sometimes, your small intestine.   A sigmoidoscopy to examine your lower colon.   A colonoscopy to examine your entire colon.  TREATMENT   Treatment will depend on the severity of your constipation and what is causing it. Some dietary treatments include drinking more fluids and eating more fiber-rich foods. Lifestyle treatments may include regular exercise. If these diet and lifestyle recommendations do not help, your caregiver may recommend taking over-the-counter laxative medicines to help you have bowel movements. Prescription medicines may be prescribed if over-the-counter medicines do not work.   HOME CARE INSTRUCTIONS    Increase dietary fiber in your diet, such as fruits, vegetables, whole grains, and beans. Limit high-fat and processed sugars in your diet, such as French fries, hamburgers, cookies, candies, and soda.    A fiber supplement may be added to your diet if you cannot get enough fiber from foods.    Drink enough fluids to keep your urine clear or pale yellow.    Exercise regularly or as directed by your caregiver.    Go to the restroom when you have the urge to go. Do not hold it.   Only take medicines as directed by your caregiver. Do not take other medicines for constipation without talking to your caregiver first.  SEEK IMMEDIATE MEDICAL CARE  IF:    You have bright red blood in your stool.    Your constipation lasts for more than 4 days or gets worse.    You have abdominal or rectal pain.    You have thin, pencil-like stools.   You have unexplained weight loss.  MAKE SURE YOU:    Understand these instructions.   Will watch your condition.   Will get help right away if you are not doing well or get worse.  Document Released: 07/28/2004 Document Revised: 01/22/2012 Document Reviewed: 10/03/2011  [employer] Patient Information 2013 [employer].

## 2012-09-20 DIAGNOSIS — M79609 Pain in unspecified limb: Secondary | ICD-10-CM | POA: Diagnosis not present

## 2012-09-20 DIAGNOSIS — B351 Tinea unguium: Secondary | ICD-10-CM | POA: Diagnosis not present

## 2012-10-26 ENCOUNTER — Other Ambulatory Visit: Payer: Self-pay | Admitting: Family Medicine

## 2012-10-27 ENCOUNTER — Encounter (HOSPITAL_COMMUNITY): Payer: Self-pay

## 2012-10-27 ENCOUNTER — Emergency Department (INDEPENDENT_AMBULATORY_CARE_PROVIDER_SITE_OTHER)
Admission: EM | Admit: 2012-10-27 | Discharge: 2012-10-27 | Disposition: A | Discharge status: Nursing Home (Includes ICF) | Payer: Medicare Other | Source: Home / Self Care | Attending: Emergency Medicine | Admitting: Emergency Medicine

## 2012-10-27 ENCOUNTER — Encounter (HOSPITAL_COMMUNITY)
Admission: EM | Disposition: A | Discharge status: Home / Self Care | Payer: Self-pay | Source: Home / Self Care | Attending: Emergency Medicine

## 2012-10-27 ENCOUNTER — Encounter (HOSPITAL_COMMUNITY): Payer: Self-pay | Admitting: Emergency Medicine

## 2012-10-27 ENCOUNTER — Emergency Department (HOSPITAL_COMMUNITY)
Admission: EM | Admit: 2012-10-27 | Discharge: 2012-10-27 | Disposition: A | Discharge status: Home / Self Care | Payer: Medicare Other | Attending: Emergency Medicine | Admitting: Emergency Medicine

## 2012-10-27 DIAGNOSIS — K222 Esophageal obstruction: Secondary | ICD-10-CM | POA: Diagnosis not present

## 2012-10-27 DIAGNOSIS — W44F3XA Food entering into or through a natural orifice, initial encounter: Secondary | ICD-10-CM

## 2012-10-27 DIAGNOSIS — I10 Essential (primary) hypertension: Secondary | ICD-10-CM | POA: Insufficient documentation

## 2012-10-27 DIAGNOSIS — R1310 Dysphagia, unspecified: Secondary | ICD-10-CM

## 2012-10-27 DIAGNOSIS — Z79899 Other long term (current) drug therapy: Secondary | ICD-10-CM | POA: Insufficient documentation

## 2012-10-27 DIAGNOSIS — E119 Type 2 diabetes mellitus without complications: Secondary | ICD-10-CM | POA: Insufficient documentation

## 2012-10-27 DIAGNOSIS — K219 Gastro-esophageal reflux disease without esophagitis: Secondary | ICD-10-CM | POA: Insufficient documentation

## 2012-10-27 DIAGNOSIS — R1314 Dysphagia, pharyngoesophageal phase: Secondary | ICD-10-CM

## 2012-10-27 DIAGNOSIS — T18128A Food in esophagus causing other injury, initial encounter: Secondary | ICD-10-CM

## 2012-10-27 DIAGNOSIS — IMO0002 Reserved for concepts with insufficient information to code with codable children: Secondary | ICD-10-CM | POA: Insufficient documentation

## 2012-10-27 DIAGNOSIS — T18108A Unspecified foreign body in esophagus causing other injury, initial encounter: Secondary | ICD-10-CM | POA: Diagnosis not present

## 2012-10-27 DIAGNOSIS — R0789 Other chest pain: Secondary | ICD-10-CM | POA: Diagnosis not present

## 2012-10-27 HISTORY — PX: ESOPHAGOGASTRODUODENOSCOPY: SHX5428

## 2012-10-27 SURGERY — EGD (ESOPHAGOGASTRODUODENOSCOPY)
Anesthesia: Moderate Sedation

## 2012-10-27 MED ORDER — FENTANYL CITRATE 0.05 MG/ML IJ SOLN
INTRAMUSCULAR | Status: AC
  Filled 2012-10-27: qty 2

## 2012-10-27 MED ORDER — SODIUM CHLORIDE 0.9 % IV SOLN: INTRAVENOUS | Status: DC

## 2012-10-27 MED ORDER — MIDAZOLAM HCL 5 MG/ML IJ SOLN
INTRAMUSCULAR | Status: AC
  Filled 2012-10-27: qty 2

## 2012-10-27 MED ORDER — BUTAMBEN-TETRACAINE-BENZOCAINE 2-2-14 % EX AERO
INHALATION_SPRAY | CUTANEOUS | Status: DC | PRN
  Administered 2012-10-27: 1 via TOPICAL

## 2012-10-27 MED ORDER — GLUCAGON HCL (RDNA) 1 MG IJ SOLR
1.0000 mg | Freq: Once | INTRAMUSCULAR | Status: AC
  Administered 2012-10-27: 1 mg via INTRAVENOUS
  Filled 2012-10-27: qty 1

## 2012-10-27 MED ORDER — MIDAZOLAM HCL 10 MG/2ML IJ SOLN
INTRAMUSCULAR | Status: DC | PRN
  Administered 2012-10-27: 2 mg via INTRAVENOUS

## 2012-10-27 MED ORDER — FENTANYL CITRATE 0.05 MG/ML IJ SOLN
INTRAMUSCULAR | Status: DC | PRN
  Administered 2012-10-27: 25 ug via INTRAVENOUS

## 2012-10-27 NOTE — ED Notes (Signed)
Patient complains of difficulty swallowing since last night after eating ham

## 2012-10-27 NOTE — ED Notes (Signed)
Josh, PA notified that after Glucagon given and coke given, pt is vomiting what she drinks.  Small particles noted in emesis.  Pt states she feels "some better".

## 2012-10-27 NOTE — ED Provider Notes (Signed)
History     CSN: [id number]  Arrival date & time 10/27/12  1200   First MD Initiated Contact with Patient 10/27/12 1249      Chief Complaint  Patient presents with  . Foreign Body    (Consider location/radiation/quality/duration/timing/severity/associated sxs/prior treatment) HPI Comments: Patient presents with complaint of dysphagia for the past 24 hours. Patient was eating ham yesterday morning and she thought it gets stuck in her throat. Since that time any liquids or solids that she has had come immediately back up and the patient spits it out. She denies chest pain but states she has a uncomfortable sensation in her right upper chest. She denies any cough or shortness of breath. No fever. Patient has had esophageal dilation in the past. Onset was acute. Course is constant. No treatments prior to arrival. Nothing makes symptoms better worse.  The history is provided by the patient.    Past Medical History  Diagnosis Date  . Breast cancer 10/2010    Invasive Ductal Carcinoma, s/p bilateral mastectomy, now on Tamoxifen. Followed by Dr Dalene Carrow.   . Diabetes mellitus   . Hypertension   . Hyperlipidemia   . Anemia     Iron def anemia  . CAD (coronary artery disease)   . CHF (congestive heart failure)     Systolic   . History of colon cancer   . Neuropathy   . Allergy   . GERD (gastroesophageal reflux disease)   . Hiatal hernia   . Diverticulosis   . Asthma   . Arthritis   . H/O: GI bleed   . Breast cancer 2011    s/p Bilateral masectomy  . Shortness of breath   . Pneumonia 03/2012    Past Surgical History  Procedure Date  . Breast masectomy 10/2010    Bilateral masectomy by Dr Ezzard Standing s/p invasive ductal carcinoma.  . Cardiac  catherization 2007    Severe 3-vessel disease.  EF 20-25%.  . Esophagogastroduodenoscopy 2001    Esophageal Tear  . Total knee arthroplasty 2001  . Breast surgery     Family History  Problem Relation Age of Onset  . Sickle cell anemia  Other   . Heart disease Mother   . Heart disease Father     History  Substance Use Topics  . Smoking status: Never Smoker   . Smokeless tobacco: Current User    Types: Snuff  . Alcohol Use: No    OB History    Grav Para Term Preterm Abortions TAB SAB Ect Mult Living                  Review of Systems  Constitutional: Negative for fever.  HENT: Positive for trouble swallowing. Negative for sore throat and rhinorrhea.   Eyes: Negative for redness.  Respiratory: Positive for chest tightness. Negative for cough.   Cardiovascular: Negative for chest pain.  Gastrointestinal: Negative for nausea, vomiting, abdominal pain and diarrhea.  Genitourinary: Negative for dysuria.  Musculoskeletal: Negative for myalgias.  Skin: Negative for rash.  Neurological: Negative for headaches.    Allergies  Peanuts and Lisinopril  Home Medications   Current Outpatient Rx  Name  Route  Sig  Dispense  Refill  . ACETAMINOPHEN 500 MG PO TABS   Oral   Take 1,000 mg by mouth every 6 (six) hours as needed. For pain          . ALBUTEROL SULFATE HFA 108 (90 BASE) MCG/ACT IN AERS   Inhalation   Inhale 2  puffs into the lungs every 6 (six) hours as needed. For shortness of breath   1 Inhaler   6   . AMLODIPINE BESYLATE 10 MG PO TABS   Oral   Take 1 tablet (10 mg total) by mouth daily.   90 tablet   3   . ASPIRIN 81 MG PO CHEW   Oral   Chew 81 mg by mouth daily.           Marland Kitchen CARVEDILOL 25 MG PO TABS   Oral   Take 1 tablet (25 mg total) by mouth 2 (two) times daily.   180 tablet   3   . COLCHICINE 0.6 MG PO TABS   Oral   Take 1 tablet (0.6 mg total) by mouth once. Take 2 tablets on 1 day, if still having pain in an hour, take 1 more tablet on day 1. After that take 2 tablets daily for 2 weeks then 1 tablet daily. See [place] back in 1 week if not improving or if you develop fevers.   60 tablet   0   . FLUTICASONE PROPIONATE 50 MCG/ACT NA SUSP   Nasal   Place 2 sprays into the nose  daily as needed. For nasal congestion   16 g   4   . FLUTICASONE PROPIONATE (INHAL) 100 MCG/BLIST IN AEPB   Inhalation   Inhale 1 puff into the lungs 2 (two) times daily.   60 each   6   . GLIPIZIDE 5 MG PO TABS   Oral   Take 1 tablet (5 mg total) by mouth 2 (two) times daily before a meal.   60 tablet   6   . HYDROCODONE-ACETAMINOPHEN 10-325 MG PO TABS   Oral   Take 1 tablet by mouth every 6 (six) hours as needed. For pain         . LOSARTAN POTASSIUM-HCTZ 100-12.5 MG PO TABS   Oral   Take 1 tablet by mouth daily.   30 tablet   3   . METFORMIN HCL 500 MG PO TABS   Oral   Take 500 mg by mouth 2 (two) times daily with a meal.         . MIRTAZAPINE 7.5 MG PO TABS   Oral   Take 7.5 mg by mouth at bedtime.          Marland Kitchen PATADAY 0.2 % OP SOLN   Both Eyes   Place 1 drop into both eyes daily as needed. For itchy eyes         . POLYETHYLENE GLYCOL 3350 PO PACK   Oral   Take 17 g by mouth daily as needed. For constipation.         Marland Kitchen RANITIDINE HCL 150 MG PO CAPS   Oral   Take 1 capsule (150 mg total) by mouth 2 (two) times daily.   180 capsule   6   . ROSUVASTATIN CALCIUM 20 MG PO TABS   Oral   Take 1 tablet (20 mg total) by mouth at bedtime.   90 tablet   6   . SENNA-DOCUSATE SODIUM 8.6-50 MG PO TABS   Oral   Take 1 tablet by mouth daily.   30 tablet   0   . TAMOXIFEN CITRATE 20 MG PO TABS   Oral   Take 1 tablet (20 mg total) by mouth daily.   90 tablet   3     Bilateral Breast Cancer     BP 137/58  Temp 92 F (33.3 C)  Resp 18  SpO2 98%  Physical Exam  Nursing note and vitals reviewed. Constitutional: She appears well-developed and well-nourished.  HENT:  Head: Normocephalic and atraumatic.  Mouth/Throat: Oropharynx is clear and moist.  Eyes: Conjunctivae normal are normal. Right eye exhibits no discharge. Left eye exhibits no discharge.  Neck: Normal range of motion. Neck supple.  Cardiovascular: Normal rate and regular rhythm.    Pulmonary/Chest: Effort normal and breath sounds normal. No respiratory distress. She has no wheezes. She has no rales.  Abdominal: Soft. There is no tenderness.  Neurological: She is alert.  Skin: Skin is warm and dry.  Psychiatric: She has a normal mood and affect.    ED Course  Procedures (including critical care time)  Labs Reviewed - No data to display No results found.   1. Esophageal obstruction     1:30 PM Patient seen and examined. Will try glucagon and carbonated beverage. Patient sees Dr. Loreta Ave for GI. Will consult if needed. D/w Dr. Karma Ganja.    Vital signs reviewed and are as follows: Filed Vitals:   10/27/12 1210  BP: 137/58  Temp: 92 F (33.3 C)  Resp: 18   Hypothermia noted, but I think this is an error. Will recheck.  Patient states discomfort improved, however when I have her swallow water, she regurgitates it after drinking and coughs.   I have spoken with Sondra Come who is working with Dr. Marina Goodell. They will see in ED.   Hand-off to Dr. Freida Busman.    MDM  Esophageal impaction, will need GI consult as patient cannot swallow.         Renne Crigler, [place] 10/27/12 [phone]

## 2012-10-27 NOTE — ED Provider Notes (Signed)
History     CSN: [id number]  Arrival date & time 10/27/12  1117   First MD Initiated Contact with Patient 10/27/12 1135      Chief Complaint  Patient presents with  . Dysphagia    (Consider location/radiation/quality/duration/timing/severity/associated sxs/prior treatment) HPI Comments: Patient presents urgent care at this morning brought in by her daughter describing that after breakfast yesterday she was eating a ham biscuit she felt FOOD GOT stuck, and she couldn't swallow well anymore. Since then she's been having a constant discomfort in her chest area (patient points to the midportion sternal region somewhat worse the right parasternal border), with excessive salivation and unable to drink or eat anything which she has attempted to do so since yesterday. Patient denies any cough or shortness of breath or vomiting. No fevers.  The history is provided by the patient.    Past Medical History  Diagnosis Date  . Breast cancer 10/2010    Invasive Ductal Carcinoma, s/p bilateral mastectomy, now on Tamoxifen. Followed by Dr Dalene Carrow.   . Diabetes mellitus   . Hypertension   . Hyperlipidemia   . Anemia     Iron def anemia  . CAD (coronary artery disease)   . CHF (congestive heart failure)     Systolic   . History of colon cancer   . Neuropathy   . Allergy   . GERD (gastroesophageal reflux disease)   . Hiatal hernia   . Diverticulosis   . Asthma   . Arthritis   . H/O: GI bleed   . Breast cancer 2011    s/p Bilateral masectomy  . Shortness of breath   . Pneumonia 03/2012    Past Surgical History  Procedure Date  . Breast masectomy 10/2010    Bilateral masectomy by Dr Ezzard Standing s/p invasive ductal carcinoma.  . Cardiac  catherization 2007    Severe 3-vessel disease.  EF 20-25%.  . Esophagogastroduodenoscopy 2001    Esophageal Tear  . Total knee arthroplasty 2001  . Breast surgery     Family History  Problem Relation Age of Onset  . Sickle cell anemia Other   . Heart  disease Mother   . Heart disease Father     History  Substance Use Topics  . Smoking status: Never Smoker   . Smokeless tobacco: Current User    Types: Snuff  . Alcohol Use: No    OB History    Grav Para Term Preterm Abortions TAB SAB Ect Mult Living                  Review of Systems  Constitutional: Positive for activity change and appetite change. Negative for fever and fatigue.  Respiratory: Positive for chest tightness. Negative for cough, shortness of breath, wheezing and stridor.   Gastrointestinal: Negative for abdominal pain.  Skin: Negative for rash.  Neurological: Negative for dizziness.    Allergies  Peanuts and Lisinopril  Home Medications   Current Outpatient Rx  Name  Route  Sig  Dispense  Refill  . ACETAMINOPHEN 500 MG PO TABS   Oral   Take 1,000 mg by mouth every 6 (six) hours as needed. For pain          . ALBUTEROL SULFATE HFA 108 (90 BASE) MCG/ACT IN AERS   Inhalation   Inhale 2 puffs into the lungs every 6 (six) hours as needed. For shortness of breath   1 Inhaler   6   . AMLODIPINE BESYLATE 10 MG PO TABS  Oral   Take 1 tablet (10 mg total) by mouth daily.   90 tablet   3   . ASPIRIN 81 MG PO CHEW   Oral   Chew 81 mg by mouth daily.           Marland Kitchen CARVEDILOL 25 MG PO TABS   Oral   Take 1 tablet (25 mg total) by mouth 2 (two) times daily.   180 tablet   3   . CEPHALEXIN 500 MG PO CAPS   Oral   Take 1 capsule (500 mg total) by mouth 2 (two) times daily.   10 capsule   0   . COLCHICINE 0.6 MG PO TABS   Oral   Take 2 tablets (1.2 mg total) by mouth once.   2 tablet   0   . COLCHICINE 0.6 MG PO TABS   Oral   Take 1 tablet (0.6 mg total) by mouth once. Take 2 tablets on 1 day, if still having pain in an hour, take 1 more tablet on day 1. After that take 2 tablets daily for 2 weeks then 1 tablet daily. See [place] back in 1 week if not improving or if you develop fevers.   60 tablet   0   . FLUTICASONE PROPIONATE 50 MCG/ACT NA  SUSP   Nasal   Place 2 sprays into the nose daily as needed. For nasal congestion   16 g   4   . FLUTICASONE PROPIONATE (INHAL) 100 MCG/BLIST IN AEPB   Inhalation   Inhale 1 puff into the lungs 2 (two) times daily.   60 each   6   . GLIPIZIDE 5 MG PO TABS   Oral   Take 1 tablet (5 mg total) by mouth 2 (two) times daily before a meal.   60 tablet   6   . LEVOCETIRIZINE DIHYDROCHLORIDE 5 MG PO TABS   Oral   Take 1 tablet (5 mg total) by mouth every evening.   30 tablet   3   . LOSARTAN POTASSIUM-HCTZ 100-12.5 MG PO TABS   Oral   Take 1 tablet by mouth daily.   30 tablet   3   . METFORMIN HCL 500 MG PO TABS   Oral   Take 500 mg by mouth 2 (two) times daily with a meal.         . MIRTAZAPINE 7.5 MG PO TABS   Oral   Take 7.5 mg by mouth at bedtime.          Marland Kitchen PATADAY 0.2 % OP SOLN   Both Eyes   Place 1 drop into both eyes daily as needed. For itchy eyes         . POLYETHYLENE GLYCOL 3350 PO PACK   Oral   Take 17 g by mouth daily as needed. For constipation.         Marland Kitchen RANITIDINE HCL 150 MG PO CAPS   Oral   Take 1 capsule (150 mg total) by mouth 2 (two) times daily.   180 capsule   6   . ROSUVASTATIN CALCIUM 20 MG PO TABS   Oral   Take 1 tablet (20 mg total) by mouth at bedtime.   90 tablet   6   . SENNA-DOCUSATE SODIUM 8.6-50 MG PO TABS   Oral   Take 1 tablet by mouth daily.   30 tablet   0   . TAMOXIFEN CITRATE 20 MG PO TABS   Oral   Take  1 tablet (20 mg total) by mouth daily.   90 tablet   3     Bilateral Breast Cancer   . TRAMADOL HCL 50 MG PO TABS   Oral   Take 1 tablet (50 mg total) by mouth every 8 (eight) hours as needed for pain.   90 tablet   1     BP 173/74  Pulse 90  Temp 99.4 F (37.4 C) (Oral)  Resp 22  SpO2 99%  Physical Exam  Nursing note and vitals reviewed. Constitutional: Vital signs are normal. She appears well-developed and well-nourished.  Non-toxic appearance. She does not have a sickly appearance. She  does not appear ill. No distress.  Eyes: Conjunctivae normal are normal.  Cardiovascular: Normal rate.  Exam reveals no friction rub.   No murmur heard. Pulmonary/Chest: Effort normal and breath sounds normal. No respiratory distress. She has no wheezes. She has no rales. She exhibits no tenderness.      ED Course  Procedures (including critical care time)  Labs Reviewed - No data to display No results found.   No diagnosis found.    MDM  Esophageal FB OBSTRUCTION- patient has been symptomatic for approximately 24 hours. Excessive salivation and retrosternal discomfort. Patient has been instructed to be n.p.o. further evaluated in the emergency department.        Jimmie Molly, MD 10/27/12 [phone]

## 2012-10-27 NOTE — ED Provider Notes (Signed)
Medical screening examination/treatment/procedure(s) were performed by non-physician practitioner and as supervising physician I was immediately available for consultation/collaboration.  Ethelda Chick, MD 10/27/12 [phone]

## 2012-10-27 NOTE — H&P (Signed)
  HISTORY OF PRESENT ILLNESS:  [name] is a 78 y.o. female with below hx presents with ham biscuit food impaction (10 am yesterday). Not handling secretions. Otherwise well. Apparently has hx GERD / stricture managed by Dr Loreta Ave. No other GI or non-GI c/o.Called in consultation by ER to assess and treat patient.  REVIEW OF SYSTEMS:  All non-GI ROS negative  Past Medical History  Diagnosis Date  . Breast cancer 10/2010    Invasive Ductal Carcinoma, s/p bilateral mastectomy, now on Tamoxifen. Followed by Dr Dalene Carrow.   . Diabetes mellitus   . Hypertension   . Hyperlipidemia   . Anemia     Iron def anemia  . CAD (coronary artery disease)   . CHF (congestive heart failure)     Systolic   . History of colon cancer   . Neuropathy   . Allergy   . GERD (gastroesophageal reflux disease)   . Hiatal hernia   . Diverticulosis   . Asthma   . Arthritis   . H/O: GI bleed   . Breast cancer 2011    s/p Bilateral masectomy  . Shortness of breath   . Pneumonia 03/2012    Past Surgical History  Procedure Date  . Breast masectomy 10/2010    Bilateral masectomy by Dr Ezzard Standing s/p invasive ductal carcinoma.  . Cardiac  catherization 2007    Severe 3-vessel disease.  EF 20-25%.  . Esophagogastroduodenoscopy 2001    Esophageal Tear  . Total knee arthroplasty 2001  . Breast surgery     Social History JINI HORIUCHI  reports that she has never smoked. Her smokeless tobacco use includes Snuff. She reports that she does not drink alcohol or use illicit drugs.  family history includes Heart disease in her father and mother and Sickle cell anemia in her other.  Allergies  Allergen Reactions  . Peanuts (Nuts) Swelling  . Lisinopril Cough       PHYSICAL EXAMINATION: Vital signs: BP 152/70  Pulse 93  Temp 99 F (37.2 C) (Oral)  Resp 16  SpO2 98% General: Well-developed, well-nourished, no acute distress HEENT: Sclerae are anicteric, conjunctiva pink. Oral mucosa intact Lungs:  Clear Heart: Regular Abdomen: soft, nontender, nondistended, no obvious ascites, no peritoneal signs, normal bowel sounds. No organomegaly. Extremities: No edema Psychiatric: alert and oriented x3. Cooperative    ASSESSMENT:  1. Food impaction 2. GERD with probable stricture 3. Multiple medical problems  PLAN:  1. EGD.The nature of the procedure, as well as the risks, benefits, and alternatives were carefully and thoroughly reviewed with the patient. Ample time for discussion and questions allowed. The patient understood, was satisfied, and agreed to proceed.

## 2012-10-27 NOTE — ED Notes (Signed)
Pt sent from [facility] for eval for possible esophageal foreign body; pt sts ate ham yesterday and now unable to swallow PO; pt spitting saliva; no SOB or resp issues noted

## 2012-10-27 NOTE — Op Note (Signed)
[facility] [address] [place], [address]   ENDOSCOPY PROCEDURE REPORT  PATIENT: [name], [name]  MR#: [id number] BIRTHDATE: December 29, 1923 , 88  yrs. old GENDER: Female ENDOSCOPIST: Roxy Cedar, MD REFERRED BY: PROCEDURE DATE:  10/27/2012 PROCEDURE:  EGD w/ forien body removal  (meat impaction) ASA CLASS:     Class III INDICATIONS:  Dysphagia.   Foreign body removal from esophagus. MEDICATIONS: Fentanyl 25 mcg IV and Versed 2 mg IV TOPICAL ANESTHETIC: Cetacaine Spray  DESCRIPTION OF PROCEDURE: After the risks benefits and alternatives of the procedure were thoroughly explained, informed consent was obtained.  The Pentax Gastroscope [id number] endoscope was introduced through the mouth and advanced to the second portion of the duodenum. Without limitations.  The instrument was slowly withdrawn as the mucosa was fully examined.      The esophagus revealed 9-48mm ringlike strictures just below UES, just above LES (with meat impaction at that level - advanced into stomach with the scope) and at the GEJ.The mucosa at the impaction was somewhat macerated.  The stomach and duodenum were normal. Retroflexed views revealed a hiatal hernia.     The scope was then withdrawn from the patient and the procedure completed.  COMPLICATIONS: There were no complications. ENDOSCOPIC IMPRESSION: 1. Multiple esophageal strictures 2. Meat impaction relieved endoscopically 3. GERD  RECOMMENDATIONS: 1.  Clear liquids until am , then soft foods until Dr Loreta Ave dilates your esophagus. 2.  Prilosec 20 mg daily 3.  OP follow-up with Dr Loreta Ave ASAP for esophageal dilation (CALL HER OFFICE MONDAY MORNING)  REPEAT EXAM:  eSigned:  Roxy Cedar, MD 10/27/2012 5:06 PM   XB:JYNWGN Loreta Ave and The Patient

## 2012-10-28 ENCOUNTER — Encounter (HOSPITAL_COMMUNITY): Payer: Self-pay | Admitting: Internal Medicine

## 2012-11-04 ENCOUNTER — Telehealth: Payer: Self-pay | Admitting: Oncology

## 2012-11-04 NOTE — Telephone Encounter (Signed)
Talked to patient and informed her that Dr. Dalene Carrow is leaving Cancer and and on 12/11/12 she will meet new MD, pt is ok with appt

## 2012-11-07 ENCOUNTER — Other Ambulatory Visit: Payer: Self-pay | Admitting: Hematology and Oncology

## 2012-11-16 ENCOUNTER — Encounter: Payer: Self-pay | Admitting: *Deleted

## 2012-12-11 ENCOUNTER — Ambulatory Visit: Payer: Medicare Other | Admitting: Hematology and Oncology

## 2012-12-11 ENCOUNTER — Telehealth: Payer: Self-pay | Admitting: Oncology

## 2012-12-11 ENCOUNTER — Ambulatory Visit (HOSPITAL_BASED_OUTPATIENT_CLINIC_OR_DEPARTMENT_OTHER): Payer: Medicaid Other | Admitting: Oncology

## 2012-12-11 ENCOUNTER — Other Ambulatory Visit (HOSPITAL_BASED_OUTPATIENT_CLINIC_OR_DEPARTMENT_OTHER): Payer: Medicare Other | Admitting: Lab

## 2012-12-11 VITALS — BP 155/76 | HR 83 | Temp 97.7°F | Resp 18 | Ht 62.0 in | Wt 120.3 lb

## 2012-12-11 DIAGNOSIS — M545 Low back pain: Secondary | ICD-10-CM

## 2012-12-11 DIAGNOSIS — C50919 Malignant neoplasm of unspecified site of unspecified female breast: Secondary | ICD-10-CM | POA: Diagnosis not present

## 2012-12-11 DIAGNOSIS — D638 Anemia in other chronic diseases classified elsewhere: Secondary | ICD-10-CM | POA: Diagnosis not present

## 2012-12-11 LAB — COMPREHENSIVE METABOLIC PANEL (CC13)
AST: 13 U/L (ref 5–34)
Albumin: 3.5 g/dL (ref 3.5–5.0)
BUN: 17.2 mg/dL (ref 7.0–26.0)
Calcium: 10.9 mg/dL — ABNORMAL HIGH (ref 8.4–10.4)
Chloride: 105 mEq/L (ref 98–107)
Glucose: 218 mg/dl — ABNORMAL HIGH (ref 70–99)
Potassium: 3.5 mEq/L (ref 3.5–5.1)

## 2012-12-11 LAB — CBC WITH DIFFERENTIAL/PLATELET
BASO%: 0.3 % (ref 0.0–2.0)
Basophils Absolute: 0 10*3/uL (ref 0.0–0.1)
Eosinophils Absolute: 0.1 10*3/uL (ref 0.0–0.5)
HCT: 32.8 % — ABNORMAL LOW (ref 34.8–46.6)
HGB: 10.5 g/dL — ABNORMAL LOW (ref 11.6–15.9)
MONO#: 0.3 10*3/uL (ref 0.1–0.9)
NEUT#: 3.8 10*3/uL (ref 1.5–6.5)
NEUT%: 62.2 % (ref 38.4–76.8)
WBC: 6.1 10*3/uL (ref 3.9–10.3)
lymph#: 1.9 10*3/uL (ref 0.9–3.3)

## 2012-12-11 MED ORDER — HYDROCODONE-ACETAMINOPHEN 5-325 MG PO TABS: 1.0000 | ORAL_TABLET | Freq: Four times a day (QID) | ORAL | Status: DC | PRN

## 2012-12-11 NOTE — Progress Notes (Signed)
[facility]  [phone] [phone]   OFFICE PROGRESS NOTE   Cc:  Marena Chancy, MD  DIAGNOSIS:   Status post bilateral simple mastectomies with bilateral axillary lymph node dissection for breast cancer in 2001. Right breast revealed a T1c N0 and left breast reveals a T1c N1 with 1/1 positive lymph nodes. ER was 100% and PR 27%. HER-2/neu showed no evidence of amplification for FISH.   CURRENT THERAPY: adjuvant Tamoxifen started in 11/2010.   INTERVAL HISTORY: [name] 79 y.o. female returns for regular follow up with a daughter.  She used to be under the care of Dr. Dalene Carrow who has left the practice.  I assumed her care for the first time today.  I had the chance to review her medical record.  She had some food impaction in 10/2012.  She has rare episodes of bilateral mastectomy scar shooting pain without palpable skin rash/nodule/discharge.  She has been taking Tamoxifen without problem.  She denied vaginal bleeding, hot flush, extremity swelling and pain.  She has moderate, diffuse osteoarhthric pain and takes Lortab in order to be functional.  She is still independent of all activities of daily living.  She still able to grocery shop, cook and clean taking pain meds.  She is about of it and asked for an emergency supply before she can see her PCP again.   The rest of the 14-point review of system was negative.   Past Medical History  Diagnosis Date  . Breast cancer 10/2010    Invasive Ductal Carcinoma, s/p bilateral mastectomy, now on Tamoxifen. Followed by Dr Dalene Carrow.   . Diabetes mellitus   . Hypertension   . Hyperlipidemia   . Anemia     Iron def anemia  . CAD (coronary artery disease)   . CHF (congestive heart failure)     Systolic   . History of colon cancer   . Neuropathy   . Allergy   . GERD (gastroesophageal reflux disease)   . Hiatal hernia   . Diverticulosis   . Asthma   . Arthritis   . H/O: GI bleed   . Breast cancer  2011    s/p Bilateral masectomy  . Shortness of breath   . Pneumonia 03/2012    Past Surgical History  Procedure Date  . Breast masectomy 10/2010    Bilateral masectomy by Dr Ezzard Standing s/p invasive ductal carcinoma.  . Cardiac  catherization 2007    Severe 3-vessel disease.  EF 20-25%.  . Esophagogastroduodenoscopy 2001    Esophageal Tear  . Total knee arthroplasty 2001  . Breast surgery   . Esophagogastroduodenoscopy 10/27/2012    Procedure: ESOPHAGOGASTRODUODENOSCOPY (EGD);  Surgeon: Hilarie Fredrickson, MD;  Location: [facility] ENDOSCOPY;  Service: Endoscopy;  Laterality: N/A;  pat    Current Outpatient Prescriptions  Medication Sig Dispense Refill  . acetaminophen (TYLENOL) 500 MG tablet Take 1,000 mg by mouth every 6 (six) hours as needed. For pain       . albuterol (PROVENTIL HFA;VENTOLIN HFA) 108 (90 BASE) MCG/ACT inhaler Inhale 2 puffs into the lungs every 6 (six) hours as needed. For shortness of breath  1 Inhaler  6  . amLODipine (NORVASC) 10 MG tablet Take 1 tablet (10 mg total) by mouth daily.  90 tablet  3  . aspirin (ASPIRIN CHILDRENS) 81 MG chewable tablet Chew 81 mg by mouth daily.        . carvedilol (COREG) 25 MG tablet TAKE 1 TABLET (25 MG TOTAL)  BY MOUTH 2 (TWO) TIMES DAILY.  60 tablet  5  . colchicine 0.6 MG tablet Take 1 tablet (0.6 mg total) by mouth once. Take 2 tablets on 1 day, if still having pain in an hour, take 1 more tablet on day 1. After that take 2 tablets daily for 2 weeks then 1 tablet daily. See [place] back in 1 week if not improving or if you develop fevers.  60 tablet  0  . fluticasone (FLONASE) 50 MCG/ACT nasal spray Place 2 sprays into the nose daily as needed. For nasal congestion  16 g  4  . Fluticasone Propionate, Inhal, (FLOVENT DISKUS) 100 MCG/BLIST AEPB Inhale 1 puff into the lungs 2 (two) times daily.  60 each  6  . glipiZIDE (GLUCOTROL) 5 MG tablet Take 1 tablet (5 mg total) by mouth 2 (two) times daily before a meal.  60 tablet  6  .  HYDROcodone-acetaminophen (NORCO) 10-325 MG per tablet Take 1 tablet by mouth every 6 (six) hours as needed. For pain      . losartan-hydrochlorothiazide (HYZAAR) 100-12.5 MG per tablet Take 1 tablet by mouth daily.  30 tablet  3  . metFORMIN (GLUCOPHAGE) 500 MG tablet Take 500 mg by mouth 2 (two) times daily with a meal.      . mirtazapine (REMERON) 7.5 MG tablet Take 7.5 mg by mouth at bedtime.       Marland Kitchen PATADAY 0.2 % SOLN Place 1 drop into both eyes daily as needed. For itchy eyes      . polyethylene glycol (MIRALAX / GLYCOLAX) packet Take 17 g by mouth daily as needed. For constipation.      . ranitidine (ZANTAC) 150 MG capsule Take 1 capsule (150 mg total) by mouth 2 (two) times daily.  180 capsule  6  . rosuvastatin (CRESTOR) 20 MG tablet Take 1 tablet (20 mg total) by mouth at bedtime.  90 tablet  6  . sennosides-docusate sodium (SENOKOT-S) 8.6-50 MG tablet Take 1 tablet by mouth daily.  30 tablet  0  . tamoxifen (NOLVADEX) 20 MG tablet TAKE 1 TABLET BY MOUTH EVERY DAY  90 tablet  3  . HYDROcodone-acetaminophen (NORCO/VICODIN) 5-325 MG per tablet Take 1 tablet by mouth every 6 (six) hours as needed for pain.  30 tablet  0  . [DISCONTINUED] cetirizine (ZYRTEC) 5 MG tablet Take 1 tablet (5 mg total) by mouth daily. Take at night time as it can cause some drowsiness  30 tablet  4    ALLERGIES:  is allergic to peanuts and lisinopril.  REVIEW OF SYSTEMS:  The rest of the 14-point review of system was negative.   Filed Vitals:   12/11/12 1417  BP: 155/76  Pulse: 83  Temp: 97.7 F (36.5 C)  Resp: 18   Wt Readings from Last 3 Encounters:  12/11/12 120 lb 4.8 oz (54.568 kg)  09/17/12 119 lb (53.978 kg)  09/13/12 120 lb (54.432 kg)   ECOG Performance status: 1  PHYSICAL EXAMINATION:   General:  well-nourished woman in no acute distress.  Eyes:  no scleral icterus.  ENT:  There were no oropharyngeal lesions.  Neck was without thyromegaly.  Lymphatics:  Negative cervical, supraclavicular  or axillary adenopathy.  Respiratory: lungs were clear bilaterally without wheezing or crackles.  Cardiovascular:  Regular rate and rhythm, S1/S2, without murmur, rub or gallop.  There was no pedal edema.  GI:  abdomen was soft, flat, nontender, nondistended, without organomegaly.  Muscoloskeletal:  no spinal tenderness of  palpation of vertebral spine.  Skin exam was without echymosis, petichae.  Neuro exam was nonfocal.  Patient was able to get on and off exam table without assistance.  Gait was normal.  Patient was alerted and oriented.  Attention was good.   Language was appropriate.  Mood was normal without depression.  Speech was not pressured.  Thought content was not tangential.   Bilateral breast exam showed bilateral mastectomy scars without nodules, erythema, purulent discharge.     LABORATORY/RADIOLOGY DATA:  Lab Results  Component Value Date   WBC 6.1 12/11/2012   HGB 10.5* 12/11/2012   HCT 32.8* 12/11/2012   PLT 176 12/11/2012   GLUCOSE 218* 12/11/2012   CHOL 96 07/10/2012   TRIG 92 07/10/2012   HDL 31* 07/10/2012   LDLDIRECT 99 10/21/2009   LDLCALC 47 07/10/2012   ALKPHOS 49 12/11/2012   ALT 7 12/11/2012   AST 13 12/11/2012   NA 139 12/11/2012   K 3.5 12/11/2012   CL 105 12/11/2012   CREATININE 1.2* 12/11/2012   BUN 17.2 12/11/2012   CO2 23 12/11/2012   HGBA1C 7.2 07/09/2012      ASSESSMENT AND PLAN:    1.  History of bilateral breast cancer: - We discussed the pros and cons of switching to an AI after 2 years of tamoxifen according to positive date from ATAC trial.  However, she has osteoarthritic pain and osteoporosis and were not interested in switching to an AI.  She prefers to keep taking Tamoxifen due to lack of any side effect. After 5 years, we will discuss whether she would like to continue it for 10 years total which has better outcome than 5 years.   2.  Osteoarthritis:  She could not function without it.  She could not see her PCP soon enough for refill.  I wrote for 30  tablet of temporary supply of Hydrocodone/APAP.    3.  Anemia of chronic disease or from slight renal insufficiency:  Stable, no active bleeding, no need for transfusion.   4.  Follow up:  In about 6 months.     The length of time of the face-to-face encounter was 25  minutes. More than 50% of time was spent counseling and coordination of care.

## 2012-12-11 NOTE — Patient Instructions (Addendum)
1.  Diagnosis:  History of breast cancer. 2.  Treatment:  Tamoxifen. 3.  Follow up:  In about 6 months.

## 2012-12-11 NOTE — Telephone Encounter (Signed)
Gave pt appt for lab and ML on July 2014

## 2012-12-13 DIAGNOSIS — M79609 Pain in unspecified limb: Secondary | ICD-10-CM | POA: Diagnosis not present

## 2012-12-13 DIAGNOSIS — B351 Tinea unguium: Secondary | ICD-10-CM | POA: Diagnosis not present

## 2012-12-16 ENCOUNTER — Other Ambulatory Visit: Payer: Self-pay | Admitting: Family Medicine

## 2012-12-17 ENCOUNTER — Ambulatory Visit (INDEPENDENT_AMBULATORY_CARE_PROVIDER_SITE_OTHER): Payer: Medicare Other | Admitting: Family Medicine

## 2012-12-17 ENCOUNTER — Encounter: Payer: Self-pay | Admitting: Family Medicine

## 2012-12-17 VITALS — BP 171/73 | HR 78 | Ht 62.0 in | Wt 122.0 lb

## 2012-12-17 DIAGNOSIS — M79673 Pain in unspecified foot: Secondary | ICD-10-CM

## 2012-12-17 DIAGNOSIS — I10 Essential (primary) hypertension: Secondary | ICD-10-CM | POA: Diagnosis not present

## 2012-12-17 DIAGNOSIS — M545 Low back pain, unspecified: Secondary | ICD-10-CM | POA: Diagnosis not present

## 2012-12-17 DIAGNOSIS — M79609 Pain in unspecified limb: Secondary | ICD-10-CM | POA: Diagnosis not present

## 2012-12-17 DIAGNOSIS — E119 Type 2 diabetes mellitus without complications: Secondary | ICD-10-CM

## 2012-12-17 MED ORDER — HYDROCODONE-ACETAMINOPHEN 5-325 MG PO TABS: 1.0000 | ORAL_TABLET | Freq: Four times a day (QID) | ORAL | Status: DC | PRN

## 2012-12-17 MED ORDER — LOSARTAN POTASSIUM 100 MG PO TABS: 100.0000 mg | ORAL_TABLET | Freq: Every day | ORAL | Status: DC

## 2012-12-17 NOTE — Patient Instructions (Addendum)
I have changed your blood pressure medicine to losartan.   Follow up with Dr. Gwenlyn Saran in 1-2 weeks.   Plantar Fasciitis (Heel Spur Syndrome) with Rehab The plantar fascia is a fibrous, ligament-like, soft-tissue structure that spans the bottom of the foot. Plantar fasciitis is a condition that causes pain in the foot due to inflammation of the tissue. SYMPTOMS   Pain and tenderness on the underneath side of the foot.  Pain that worsens with standing or walking. CAUSES  Plantar fasciitis is caused by irritation and injury to the plantar fascia on the underneath side of the foot. Common mechanisms of injury include:  Direct trauma to bottom of the foot.  Damage to a small nerve that runs under the foot where the main fascia attaches to the heel bone.  Stress placed on the plantar fascia due to bone spurs. RISK INCREASES WITH:   Activities that place stress on the plantar fascia (running, jumping, pivoting, or cutting).  Poor strength and flexibility.  Improperly fitted shoes.  Tight calf muscles.  Flat feet.  Failure to warm-up properly before activity.  Obesity. PREVENTION  Warm up and stretch properly before activity.  Allow for adequate recovery between workouts.  Maintain physical fitness:  Strength, flexibility, and endurance.  Cardiovascular fitness.  Maintain a health body weight.  Avoid stress on the plantar fascia.  Wear properly fitted shoes, including arch supports for individuals who have flat feet. PROGNOSIS  If treated properly, then the symptoms of plantar fasciitis usually resolve without surgery. However, occasionally surgery is necessary. RELATED COMPLICATIONS   Recurrent symptoms that may result in a chronic condition.  Problems of the lower back that are caused by compensating for the injury, such as limping.  Pain or weakness of the foot during push-off following surgery.  Chronic inflammation, scarring, and partial or complete fascia tear,  occurring more often from repeated injections. TREATMENT  Treatment initially involves the use of ice and medication to help reduce pain and inflammation. The use of strengthening and stretching exercises may help reduce pain with activity, especially stretches of the Achilles tendon. These exercises may be performed at home or with a therapist. Your caregiver may recommend that you use heel cups of arch supports to help reduce stress on the plantar fascia. Occasionally, corticosteroid injections are given to reduce inflammation. If symptoms persist for greater than 6 months despite non-surgical (conservative), then surgery may be recommended.  MEDICATION   If pain medication is necessary, then nonsteroidal anti-inflammatory medications, such as aspirin and ibuprofen, or other minor pain relievers, such as acetaminophen, are often recommended.  Do not take pain medication within 7 days before surgery.  Prescription pain relievers may be given if deemed necessary by your caregiver. Use only as directed and only as much as you need.  Corticosteroid injections may be given by your caregiver. These injections should be reserved for the most serious cases, because they may only be given a certain number of times. HEAT AND COLD  Cold treatment (icing) relieves pain and reduces inflammation. Cold treatment should be applied for 10 to 15 minutes every 2 to 3 hours for inflammation and pain and immediately after any activity that aggravates your symptoms. Use ice packs or massage the area with a piece of ice (ice massage).  Heat treatment may be used prior to performing the stretching and strengthening activities prescribed by your caregiver, physical therapist, or athletic trainer. Use a heat pack or soak the injury in warm water. SEEK IMMEDIATE MEDICAL CARE  IF:  Treatment seems to offer no benefit, or the condition worsens.  Any medications produce adverse side effects. EXERCISES RANGE OF MOTION  (ROM) AND STRETCHING EXERCISES - Plantar Fasciitis (Heel Spur Syndrome) These exercises may help you when beginning to rehabilitate your injury. Your symptoms may resolve with or without further involvement from your physician, physical therapist or athletic trainer. While completing these exercises, remember:   Restoring tissue flexibility helps normal motion to return to the joints. This allows healthier, less painful movement and activity.  An effective stretch should be held for at least 30 seconds.  A stretch should never be painful. You should only feel a gentle lengthening or release in the stretched tissue. RANGE OF MOTION - Toe Extension, Flexion  Sit with your right / left leg crossed over your opposite knee.  Grasp your toes and gently pull them back toward the top of your foot. You should feel a stretch on the bottom of your toes and/or foot.  Hold this stretch for __________ seconds.  Now, gently pull your toes toward the bottom of your foot. You should feel a stretch on the top of your toes and or foot.  Hold this stretch for __________ seconds. Repeat __________ times. Complete this stretch __________ times per day.   STRENGTHENING EXERCISES - Plantar Fasciitis (Heel Spur Syndrome)  These exercises may help you when beginning to rehabilitate your injury. They may resolve your symptoms with or without further involvement from your physician, physical therapist or athletic trainer. While completing these exercises, remember:   Muscles can gain both the endurance and the strength needed for everyday activities through controlled exercises.  Complete these exercises as instructed by your physician, physical therapist or athletic trainer. Progress the resistance and repetitions only as guided. STRENGTH - Towel Curls  Sit in a chair positioned on a non-carpeted surface.  Place your foot on a towel, keeping your heel on the floor.  Pull the towel toward your heel by only  curling your toes. Keep your heel on the floor.  If instructed by your physician, physical therapist or athletic trainer, add ____________________ at the end of the towel. Repeat __________ times. Complete this exercise __________ times per day.

## 2012-12-19 ENCOUNTER — Other Ambulatory Visit: Payer: Self-pay | Admitting: Family Medicine

## 2012-12-19 DIAGNOSIS — M79673 Pain in unspecified foot: Secondary | ICD-10-CM | POA: Insufficient documentation

## 2012-12-19 NOTE — Progress Notes (Signed)
  Subjective:    Patient ID: [name], female    DOB: [birth year], 79 y.o.   MRN: [id number]  HPI  1. HTN:  Here for follow up of HTN.  She is currently out of her losartan-HCTZ and needs a refill on this medication.  She has been tolerating this medication well.  She denies dizziness, headache, chest pain.   2. Foot pain:  C/o R foot pain x2 days.  Feels like "sharp and stabbing" pain in her heel area.  Worse with walking and first getting out of bed in the morning.  Denies swelling or redness of the foot and this feels different from her gout pain.    Review of Systems Per HPI    Objective:   Physical Exam  Constitutional: She appears well-nourished. No distress.  HENT:  Head: Normocephalic and atraumatic.  Cardiovascular: Normal rate, regular rhythm and normal heart sounds.   Pulmonary/Chest: Breath sounds normal. No respiratory distress.  Musculoskeletal: She exhibits edema (trace).       R foot tender along calcaneus at area of plantar fascia insertion.  No bony tenderness, swelling or erythema.           Assessment & Plan:

## 2012-12-19 NOTE — Assessment & Plan Note (Signed)
History and exam consistent with plantar fasciitis.  Advised icing and stretches to help with this.

## 2012-12-19 NOTE — Assessment & Plan Note (Signed)
Losartan-Hctz changed to losartan only due to history of gout.  Bp elevated today but out of her medication.  Will have her f/u with Dr. Gwenlyn Saran for BP recheck on losartan.

## 2013-01-01 ENCOUNTER — Encounter: Payer: Self-pay | Admitting: Family Medicine

## 2013-01-01 ENCOUNTER — Ambulatory Visit (INDEPENDENT_AMBULATORY_CARE_PROVIDER_SITE_OTHER): Payer: Medicare Other | Admitting: Family Medicine

## 2013-01-01 VITALS — BP 124/73 | HR 80 | Temp 98.9°F | Ht 62.0 in | Wt 122.4 lb

## 2013-01-01 DIAGNOSIS — J069 Acute upper respiratory infection, unspecified: Secondary | ICD-10-CM | POA: Diagnosis not present

## 2013-01-01 DIAGNOSIS — E119 Type 2 diabetes mellitus without complications: Secondary | ICD-10-CM | POA: Diagnosis not present

## 2013-01-01 DIAGNOSIS — Z853 Personal history of malignant neoplasm of breast: Secondary | ICD-10-CM | POA: Diagnosis not present

## 2013-01-01 DIAGNOSIS — I10 Essential (primary) hypertension: Secondary | ICD-10-CM | POA: Diagnosis not present

## 2013-01-01 LAB — BASIC METABOLIC PANEL
Chloride: 106 mEq/L (ref 96–112)
Creat: 1.04 mg/dL (ref 0.50–1.10)

## 2013-01-01 MED ORDER — BENZONATATE 100 MG PO CAPS: 100.0000 mg | ORAL_CAPSULE | Freq: Three times a day (TID) | ORAL | Status: DC | PRN

## 2013-01-01 MED ORDER — LOSARTAN POTASSIUM 100 MG PO TABS: 100.0000 mg | ORAL_TABLET | Freq: Every day | ORAL | Status: DC

## 2013-01-01 NOTE — Patient Instructions (Addendum)
We are changing one of your blood pressure medicines from losartan/hydrochlorothiazide to losartan only. Continue with the norvasc and the coreg.   I am getting blood work to check your kidney function.   I'm sending medicine for cough called tessalon perles.   Follow up in 3 months.

## 2013-01-02 ENCOUNTER — Telehealth: Payer: Self-pay | Admitting: Family Medicine

## 2013-01-02 ENCOUNTER — Encounter: Payer: Self-pay | Admitting: Family Medicine

## 2013-01-02 DIAGNOSIS — J069 Acute upper respiratory infection, unspecified: Secondary | ICD-10-CM | POA: Insufficient documentation

## 2013-01-02 MED ORDER — LOSARTAN POTASSIUM 100 MG PO TABS: 100.0000 mg | ORAL_TABLET | Freq: Every day | ORAL | Status: DC

## 2013-01-02 NOTE — Assessment & Plan Note (Signed)
HCTZ d/ced at last visit for concern for gout, but patient did not get new prescription. BP well controled. Will send Rx for losartan 100mg . BMP obtained today since Cr mildly elevated compared to baseline 1 month ago.

## 2013-01-02 NOTE — Progress Notes (Signed)
Patient ID: [name]    DOB: [birth year], 79 y.o.   MRN: [id number] --- Subjective:  [name] is a 79 y.o.female who presents for follow up on DM2 and HTN. Additionally, concerns include the following: - breast pain: bilaterally along side of scars from mastectomy in 2011. Pain wakes her up at night. Has been present since surgery but worsened recently. Was seen by her oncologist who prescribed percocet and recommended continued pain management with PCP. PAtient reports relief with medication.  - cough and rhinorrhea x 2 days. No sore throat, no difficulty breathing. No fever or chills.  - DM2: takes metformin 1000mg  bid and glipizide 5 daily. No hypoglycemic episodes. No tingling or numbness in lower extremities. Some change in vision: feeling she doesn't see as well.  - HTN: continues to take amlodipine 10mg  daily, carvedilol 25mg  bid and losartan/hctz 100/25. No chest pain, no shortness of breath, no lower extremity edema.   ROS: see HPI Past Medical History: reviewed and updated medications and allergies. Social History: Tobacco: tobacco snuff  Objective: Filed Vitals:   01/01/13 1556  BP: 124/73  Pulse: 80  Temp: 98.9 F (37.2 C)    Physical Examination:   General appearance - alert, well appearing, and in no distress Nose - normal and patent, no erythema, discharge or polyps Mouth - mucous membranes moist, pharynx normal without lesions Chest - clear to auscultation, no wheezes, rales or rhonchi, symmetric air entry Heart - normal rate, regular rhythm, normal S1, S2, no murmurs, rubs, clicks or gallops Extremities - no pedal edema

## 2013-01-02 NOTE — Assessment & Plan Note (Signed)
Pain along incisions appears to be worst. Was prescribed percocet by oncologist. Explained to patient that if I were to prescribe it to her, she could only receive it from me. She understood this. She currently has another refill from her oncologist. OK for her to use that refill.

## 2013-01-02 NOTE — Assessment & Plan Note (Signed)
A1C of 7.2 at goal without symptoms of hypoglycemia. Continue metformin ad glipizide.

## 2013-01-02 NOTE — Telephone Encounter (Signed)
Called patient with BMP results.  Patient voiced that she hadn't received losartan. Will resend medicine.

## 2013-01-02 NOTE — Assessment & Plan Note (Addendum)
Likely viral. Tessalon perles for cough relief.

## 2013-01-20 ENCOUNTER — Emergency Department (HOSPITAL_COMMUNITY): Payer: Medicare Other

## 2013-01-20 ENCOUNTER — Encounter (HOSPITAL_COMMUNITY): Payer: Self-pay | Admitting: Adult Health

## 2013-01-20 ENCOUNTER — Observation Stay (HOSPITAL_COMMUNITY)
Admission: EM | Admit: 2013-01-20 | Discharge: 2013-01-22 | Disposition: A | Discharge status: Home / Self Care | Payer: Medicare Other | Attending: Family Medicine | Admitting: Family Medicine

## 2013-01-20 DIAGNOSIS — N184 Chronic kidney disease, stage 4 (severe): Secondary | ICD-10-CM | POA: Insufficient documentation

## 2013-01-20 DIAGNOSIS — R5381 Other malaise: Secondary | ICD-10-CM | POA: Insufficient documentation

## 2013-01-20 DIAGNOSIS — D509 Iron deficiency anemia, unspecified: Secondary | ICD-10-CM | POA: Diagnosis not present

## 2013-01-20 DIAGNOSIS — E119 Type 2 diabetes mellitus without complications: Secondary | ICD-10-CM | POA: Insufficient documentation

## 2013-01-20 DIAGNOSIS — I129 Hypertensive chronic kidney disease with stage 1 through stage 4 chronic kidney disease, or unspecified chronic kidney disease: Secondary | ICD-10-CM | POA: Diagnosis not present

## 2013-01-20 DIAGNOSIS — Z8673 Personal history of transient ischemic attack (TIA), and cerebral infarction without residual deficits: Secondary | ICD-10-CM | POA: Insufficient documentation

## 2013-01-20 DIAGNOSIS — M1990 Unspecified osteoarthritis, unspecified site: Secondary | ICD-10-CM | POA: Insufficient documentation

## 2013-01-20 DIAGNOSIS — I2510 Atherosclerotic heart disease of native coronary artery without angina pectoris: Secondary | ICD-10-CM | POA: Insufficient documentation

## 2013-01-20 DIAGNOSIS — R0602 Shortness of breath: Principal | ICD-10-CM | POA: Insufficient documentation

## 2013-01-20 DIAGNOSIS — G319 Degenerative disease of nervous system, unspecified: Secondary | ICD-10-CM | POA: Insufficient documentation

## 2013-01-20 DIAGNOSIS — I509 Heart failure, unspecified: Secondary | ICD-10-CM | POA: Insufficient documentation

## 2013-01-20 DIAGNOSIS — D696 Thrombocytopenia, unspecified: Secondary | ICD-10-CM | POA: Diagnosis not present

## 2013-01-20 DIAGNOSIS — E785 Hyperlipidemia, unspecified: Secondary | ICD-10-CM | POA: Insufficient documentation

## 2013-01-20 DIAGNOSIS — E876 Hypokalemia: Secondary | ICD-10-CM | POA: Insufficient documentation

## 2013-01-20 DIAGNOSIS — I5020 Unspecified systolic (congestive) heart failure: Secondary | ICD-10-CM | POA: Insufficient documentation

## 2013-01-20 DIAGNOSIS — J45909 Unspecified asthma, uncomplicated: Secondary | ICD-10-CM | POA: Insufficient documentation

## 2013-01-20 DIAGNOSIS — I5022 Chronic systolic (congestive) heart failure: Secondary | ICD-10-CM | POA: Diagnosis not present

## 2013-01-20 DIAGNOSIS — Z853 Personal history of malignant neoplasm of breast: Secondary | ICD-10-CM | POA: Insufficient documentation

## 2013-01-20 DIAGNOSIS — Z85038 Personal history of other malignant neoplasm of large intestine: Secondary | ICD-10-CM | POA: Insufficient documentation

## 2013-01-20 DIAGNOSIS — R5383 Other fatigue: Secondary | ICD-10-CM | POA: Diagnosis not present

## 2013-01-20 LAB — GLUCOSE, CAPILLARY: Glucose-Capillary: 142 mg/dL — ABNORMAL HIGH (ref 70–99)

## 2013-01-20 LAB — BASIC METABOLIC PANEL
BUN: 16 mg/dL (ref 6–23)
CO2: 23 mEq/L (ref 19–32)
Chloride: 104 mEq/L (ref 96–112)
Glucose, Bld: 133 mg/dL — ABNORMAL HIGH (ref 70–99)
Potassium: 3.3 mEq/L — ABNORMAL LOW (ref 3.5–5.1)

## 2013-01-20 LAB — POCT I-STAT TROPONIN I: Troponin i, poc: 0.01 ng/mL (ref 0.00–0.08)

## 2013-01-20 NOTE — ED Notes (Addendum)
Presents with sudden onset of SOB at 930 this evening, dneis pain, denies nausea. C/o generalized fatigue. She is alert, drowsy, breathing easily, denies pain. sats 100% RA.  Denies dark tarry, bloody stools.

## 2013-01-20 NOTE — ED Notes (Signed)
CBG 142. Patient states she last ate at four pm.

## 2013-01-21 ENCOUNTER — Encounter (HOSPITAL_COMMUNITY): Payer: Self-pay | Admitting: Radiology

## 2013-01-21 ENCOUNTER — Emergency Department (HOSPITAL_COMMUNITY): Payer: Medicare Other

## 2013-01-21 DIAGNOSIS — I2510 Atherosclerotic heart disease of native coronary artery without angina pectoris: Secondary | ICD-10-CM

## 2013-01-21 DIAGNOSIS — I5022 Chronic systolic (congestive) heart failure: Secondary | ICD-10-CM | POA: Diagnosis not present

## 2013-01-21 DIAGNOSIS — R011 Cardiac murmur, unspecified: Secondary | ICD-10-CM | POA: Diagnosis not present

## 2013-01-21 DIAGNOSIS — E119 Type 2 diabetes mellitus without complications: Secondary | ICD-10-CM | POA: Diagnosis not present

## 2013-01-21 DIAGNOSIS — R5381 Other malaise: Secondary | ICD-10-CM | POA: Diagnosis not present

## 2013-01-21 DIAGNOSIS — E785 Hyperlipidemia, unspecified: Secondary | ICD-10-CM | POA: Diagnosis not present

## 2013-01-21 LAB — IRON AND TIBC
Iron: 44 ug/dL (ref 42–135)
Saturation Ratios: 19 % — ABNORMAL LOW (ref 20–55)
TIBC: 237 ug/dL — ABNORMAL LOW (ref 250–470)
UIBC: 193 ug/dL (ref 125–400)

## 2013-01-21 LAB — CBC
HCT: 30.2 % — ABNORMAL LOW (ref 36.0–46.0)
HCT: 27.3 % — ABNORMAL LOW (ref 36.0–46.0)
Hemoglobin: 10.1 g/dL — ABNORMAL LOW (ref 12.0–15.0)
Hemoglobin: 9 g/dL — ABNORMAL LOW (ref 12.0–15.0)
MCH: 23 pg — ABNORMAL LOW (ref 26.0–34.0)
MCHC: 33 g/dL (ref 30.0–36.0)
RBC: 4.43 MIL/uL (ref 3.87–5.11)
RBC: 3.91 MIL/uL (ref 3.87–5.11)
WBC: 3.9 10*3/uL — ABNORMAL LOW (ref 4.0–10.5)

## 2013-01-21 LAB — URINALYSIS, ROUTINE W REFLEX MICROSCOPIC
Bilirubin Urine: NEGATIVE
Glucose, UA: NEGATIVE mg/dL
Hgb urine dipstick: NEGATIVE
Ketones, ur: NEGATIVE mg/dL
Protein, ur: NEGATIVE mg/dL
pH: 7.5 (ref 5.0–8.0)

## 2013-01-21 LAB — COMPREHENSIVE METABOLIC PANEL
Alkaline Phosphatase: 32 U/L — ABNORMAL LOW (ref 39–117)
BUN: 13 mg/dL (ref 6–23)
CO2: 24 mEq/L (ref 19–32)
Calcium: 10.2 mg/dL (ref 8.4–10.5)
GFR calc Af Amer: 67 mL/min — ABNORMAL LOW (ref >90)
GFR calc non Af Amer: 58 mL/min — ABNORMAL LOW (ref >90)
Glucose, Bld: 142 mg/dL — ABNORMAL HIGH (ref 70–99)
Potassium: 3.7 mEq/L (ref 3.5–5.1)
Total Protein: 6.4 g/dL (ref 6.0–8.3)

## 2013-01-21 LAB — RETICULOCYTES
RBC.: 4.22 MIL/uL (ref 3.87–5.11)
Retic Count, Absolute: 50.6 10*3/uL (ref 19.0–186.0)
Retic Ct Pct: 1.2 % (ref 0.4–3.1)

## 2013-01-21 LAB — VITAMIN B12: Vitamin B-12: 685 pg/mL (ref 211–911)

## 2013-01-21 LAB — FOLATE: Folate: 17.6 ng/mL

## 2013-01-21 LAB — SAVE SMEAR

## 2013-01-21 LAB — TROPONIN I: Troponin I: <0.3 ng/mL (ref <0.30)

## 2013-01-21 LAB — FERRITIN: Ferritin: 337 ng/mL — ABNORMAL HIGH (ref 10–291)

## 2013-01-21 LAB — GLUCOSE, CAPILLARY: Glucose-Capillary: 129 mg/dL — ABNORMAL HIGH (ref 70–99)

## 2013-01-21 MED ORDER — TAMOXIFEN CITRATE 10 MG PO TABS
10.0000 mg | ORAL_TABLET | Freq: Two times a day (BID) | ORAL | Status: DC
  Administered 2013-01-21 – 2013-01-22 (×3): 10 mg via ORAL
  Filled 2013-01-21 (×4): qty 1

## 2013-01-21 MED ORDER — PANTOPRAZOLE SODIUM 20 MG PO TBEC
20.0000 mg | DELAYED_RELEASE_TABLET | Freq: Every day | ORAL | Status: DC
  Administered 2013-01-21 – 2013-01-22 (×2): 20 mg via ORAL
  Filled 2013-01-21 (×2): qty 1

## 2013-01-21 MED ORDER — FLUTICASONE PROPIONATE (INHAL) 100 MCG/BLIST IN AEPB: 1.0000 | INHALATION_SPRAY | Freq: Two times a day (BID) | RESPIRATORY_TRACT | Status: DC

## 2013-01-21 MED ORDER — TAMOXIFEN CITRATE 20 MG PO TABS: 10.0000 mg | ORAL_TABLET | Freq: Two times a day (BID) | ORAL | Status: DC

## 2013-01-21 MED ORDER — AMLODIPINE BESYLATE 10 MG PO TABS
10.0000 mg | ORAL_TABLET | Freq: Every day | ORAL | Status: DC
  Administered 2013-01-22: 10 mg via ORAL
  Filled 2013-01-21 (×2): qty 1

## 2013-01-21 MED ORDER — POLYETHYLENE GLYCOL 3350 17 G PO PACK: 17.0000 g | PACK | Freq: Every day | ORAL | Status: DC | PRN

## 2013-01-21 MED ORDER — ATORVASTATIN CALCIUM 40 MG PO TABS
40.0000 mg | ORAL_TABLET | Freq: Every day | ORAL | Status: DC
  Administered 2013-01-21: 40 mg via ORAL
  Filled 2013-01-21 (×2): qty 1

## 2013-01-21 MED ORDER — POTASSIUM CHLORIDE CRYS ER 20 MEQ PO TBCR
20.0000 meq | EXTENDED_RELEASE_TABLET | Freq: Once | ORAL | Status: AC
  Administered 2013-01-21: 20 meq via ORAL
  Filled 2013-01-21: qty 1

## 2013-01-21 MED ORDER — SODIUM CHLORIDE 0.9 % IJ SOLN
3.0000 mL | Freq: Two times a day (BID) | INTRAMUSCULAR | Status: DC
  Administered 2013-01-21: 3 mL via INTRAVENOUS

## 2013-01-21 MED ORDER — FLUTICASONE PROPIONATE HFA 110 MCG/ACT IN AERO
1.0000 | INHALATION_SPRAY | Freq: Two times a day (BID) | RESPIRATORY_TRACT | Status: DC
  Administered 2013-01-21 – 2013-01-22 (×2): 1 via RESPIRATORY_TRACT
  Filled 2013-01-21: qty 12

## 2013-01-21 MED ORDER — ALBUTEROL SULFATE HFA 108 (90 BASE) MCG/ACT IN AERS
2.0000 | INHALATION_SPRAY | Freq: Four times a day (QID) | RESPIRATORY_TRACT | Status: DC | PRN
  Filled 2013-01-21: qty 6.7

## 2013-01-21 MED ORDER — ACETAMINOPHEN 650 MG RE SUPP: 325.0000 mg | Freq: Four times a day (QID) | RECTAL | Status: DC | PRN

## 2013-01-21 MED ORDER — ACETAMINOPHEN 325 MG PO TABS: 325.0000 mg | ORAL_TABLET | Freq: Four times a day (QID) | ORAL | Status: DC | PRN

## 2013-01-21 MED ORDER — CARVEDILOL 25 MG PO TABS
25.0000 mg | ORAL_TABLET | Freq: Two times a day (BID) | ORAL | Status: DC
  Administered 2013-01-21 – 2013-01-22 (×2): 25 mg via ORAL
  Filled 2013-01-21 (×5): qty 1

## 2013-01-21 MED ORDER — IOHEXOL 350 MG/ML SOLN
100.0000 mL | Freq: Once | INTRAVENOUS | Status: AC | PRN
  Administered 2013-01-21: 100 mL via INTRAVENOUS

## 2013-01-21 MED ORDER — SODIUM CHLORIDE 0.9 % IV SOLN
INTRAVENOUS | Status: DC
  Administered 2013-01-21 (×2): via INTRAVENOUS

## 2013-01-21 MED ORDER — ASPIRIN 81 MG PO CHEW
81.0000 mg | CHEWABLE_TABLET | Freq: Every day | ORAL | Status: DC
  Administered 2013-01-21 – 2013-01-22 (×2): 81 mg via ORAL
  Filled 2013-01-21 (×2): qty 1

## 2013-01-21 MED ORDER — HEPARIN SODIUM (PORCINE) 5000 UNIT/ML IJ SOLN
5000.0000 [IU] | Freq: Three times a day (TID) | INTRAMUSCULAR | Status: DC
  Administered 2013-01-21 – 2013-01-22 (×4): 5000 [IU] via SUBCUTANEOUS
  Filled 2013-01-21 (×7): qty 1

## 2013-01-21 MED ORDER — HYDROCODONE-ACETAMINOPHEN 5-325 MG PO TABS: 1.0000 | ORAL_TABLET | Freq: Four times a day (QID) | ORAL | Status: DC | PRN

## 2013-01-21 MED ORDER — OLOPATADINE HCL 0.1 % OP SOLN
1.0000 [drp] | Freq: Every day | OPHTHALMIC | Status: DC | PRN
  Filled 2013-01-21: qty 5

## 2013-01-21 MED ORDER — LOSARTAN POTASSIUM 50 MG PO TABS
100.0000 mg | ORAL_TABLET | Freq: Every day | ORAL | Status: DC
  Administered 2013-01-22: 100 mg via ORAL
  Filled 2013-01-21 (×3): qty 2

## 2013-01-21 MED ORDER — INSULIN ASPART 100 UNIT/ML ~~LOC~~ SOLN
0.0000 [IU] | Freq: Three times a day (TID) | SUBCUTANEOUS | Status: DC
  Administered 2013-01-21: 1 [IU] via SUBCUTANEOUS
  Administered 2013-01-21: 2 [IU] via SUBCUTANEOUS
  Administered 2013-01-22: 1 [IU] via SUBCUTANEOUS
  Administered 2013-01-22: 2 [IU] via SUBCUTANEOUS

## 2013-01-21 MED ORDER — OLOPATADINE HCL 0.2 % OP SOLN
1.0000 [drp] | Freq: Every day | OPHTHALMIC | Status: DC | PRN
Start: 2013-01-21 — End: 2013-01-21

## 2013-01-21 NOTE — Discharge Summary (Signed)
Physician Discharge Summary  Patient ID: [name] MRN: [id number] DOB: Apr 26, 1924 Age: 79 y.o.  Admit date: 01/20/2013 Discharge date: 01/22/2013 Admitting Physician: Janit Pagan, MD  PCP: Marena Chancy, MD  Consultants: None      Discharge Diagnosis:  Principal Problem:   Weakness generalized Active Problems:   DIABETES MELLITUS II, UNCOMPLICATED   HYPERLIPIDEMIA   ANEMIA-IRON DEFICIENCY   HYPERTENSION, BENIGN SYSTEMIC   ASTHMA, PERSISTENT   Breast cancer    Hospital Course [name] is a 79 y.o. year old female presenting with weakness and dyspnea on exertion   1) Weakness, unknown etiology  - Pt with generalized weakness ongoing prior two days to admission.  In the ED, multiple test were down including a head CT not showing acute bleed (chronic microvascular and white matter changes), risk stratification labs done within last 6 months (A1C 7.1 on 01/01/13 and Lipid Panel 07/10/12 with TC 96/LDL 47), BNP of 248.1, POC troponin 0.01, BMET with K+ 3.3, CBC with Hgb 10.1 (baseline 10-11) and MCV 70, and EKG in NSR looking unchanged. She was admitted to the hospital and had PT/OT evaluate who recommended home health.  Also, pt had iron panel done showing ferritin of 493, Iron 44, TIBC 237, consistent with anemia of chronic inflammation.  A retic panel was done showing Retic % of 1.2 as well.  A UA and BCx were also drawn and neither of these showed markers of infection.  An Echo was performed to evaluate for AS/AI and this was not present and an EF was 65% with evidence of Pulmonary Artery HTN (pressure of 60 mm Hg).  Pt improved throughout her stay, was eating, and back to her normal state of health by the time of discharge  2) Dyspnea on Exertion - Pt on admission c/o some shortness of breath with exertion over the previous few days.  Pt had CTA to r/o PE and this did not show evidence of an embolus.  A BNP on admission was 248, CXR did not show pulmonary congestion, she did  not c/o pillow orthopnea, Paroxysmal Nocturnal Dypsnea, or have a previous dx of CHF.  A repeat Echo was performed showing an EF of 60-65% with PA of 60 mmHg.  She was back to baseline upon discharge and not having SOB with exertion or at rest.  3. History of B/L Invasive Ductal Carcinoma Breast CA s/p Masectomy in 2001 - Stable, followed by Dr. Gaylyn Rong q 6 months (last appt 12/11/12), continued home Tamoxifen   4. HTN - Stable, her ARB was held on admission due to contrast load.  This was restarted 24 hrs s/p contrast.   5. CKD stage III - IV - Stable, creatinine < 1 during hospital stay.   6. Microcytic Anemia - MCV < 70 on CBC with Hgb stable around 9-10 during her stay.  Iron studies show a picture of anemia of chronic inflammation.  A peripheral smear was consistent with microcytic anemia as well.   7. HLD - Stable, continued home crestor  8. DM II, controlled - A1C 7.2 from 01/01/13.  Pt was placed on SSI and her Metformin/Glipizide were held on admission.  Her glipizide was stopped on d/c due to possible hypoglycemia causing her weakness.   9. History of Asthma, persistent - Stable, continued home inhalers  10. Osteoarthritis - Stable, continued home Vicodin and tylenol        Discharge PE   Filed Vitals:   01/22/13 0933  BP: 150/56  Pulse:  70  Temp: 98.3 F (36.8 C)  Resp: 20   General: NAD, comfortable in bed  HEENT: /AT, MMM, EOMI B/L, PERRLA  Neck: No JVD, no carotid bruit B/L  Cardiovascular: RRR, no murmur appreciated Respiratory: CTA B/L, no wheezing appreciated  Abdomen: Soft, NT/ND, NABS  Extremities: no edema B/L, pulses + 2 LE B/L  Skin: No rashes, intact and dry  Neuro: AAO x 3       Procedures/Imaging:  Dg Chest 2 View  01/21/2013   IMPRESSION: Minimal enlargement of cardiac silhouette. No acute abnormalities.   Original Report Authenticated By: Ulyses Southward, M.D.    Ct Head Wo Contrast  01/21/2013   IMPRESSION: Atrophy with small vessel chronic ischemic  changes deep cerebral white matter. Old right basal ganglia lacunar infarct. No acute intracranial abnormalities.   Original Report Authenticated By: Ulyses Southward, M.D.    Ct Angio Chest Pe W/cm &/or Wo Cm  01/21/2013 IMPRESSION: No pulmonary embolism or acute intrathoracic process identified.  Atherosclerotic disease of the aorta.  Coronary artery calcification.   Original Report Authenticated By: Jearld Lesch, M.D.    ECHO 01/21/13   - Left ventricle: The cavity size was normal. There was mild concentric hypertrophy. Systolic function was vigorous. The estimated ejection fraction was in the range of 65% to 70%. Doppler parameters are consistent with abnormal left ventricular relaxation (grade 1 diastolic dysfunction). The E/e' ratio is >10, suggesting elevated LV filling pressure. - Aortic valve: Trileaflet. Sclerosis without stenosis. Trivial regurgitation. - Mitral valve: Mildly thickened leaflets . Trivial regurgitation. - Left atrium: The atrium was mildly dilated. Volume index: 64ml/m^2 (S). - Pulmonary arteries: PA peak pressure: 60mm Hg (S).   Labs  CBC  Recent Labs Lab 01/20/13 2308 01/21/13 0710 01/22/13 0605  WBC 3.9* 3.9* 4.8  HGB 10.1* 9.0* 9.2*  HCT 30.2* 27.3* 28.2*  PLT 103* 137* 149*   BMET  Recent Labs Lab 01/20/13 2308 01/21/13 0710  NA 137 138  K 3.3* 3.7  CL 104 105  CO2 23 24  BUN 16 13  CREATININE 0.98 0.87  CALCIUM 10.9* 10.2  PROT  --  6.4  BILITOT  --  0.1*  ALKPHOS  --  32*  ALT  --  5  AST  --  11  GLUCOSE 133* 142*   PRO B NATRIURETIC PEPTIDE     Status: None   Collection Time    01/20/13 11:09 PM      Result Value Range   Pro B Natriuretic peptide (BNP) 248.1  0 - 450 pg/mL  VITAMIN B12     Status: None   Collection Time    01/21/13 11:12 AM      Result Value Range   Vitamin B-12 685  211 - 911 pg/mL  FOLATE     Status: None   Collection Time    01/21/13 11:12 AM      Result Value Range   Folate 17.6     Comment:  (NOTE)     Reference Ranges            Deficient:       0.4 - 3.3 ng/mL            Indeterminate:   3.4 - 5.4 ng/mL            Normal:              > 5.4 ng/mL  IRON AND TIBC     Status: Abnormal   Collection  Time    01/21/13 11:12 AM      Result Value Range   Iron 44  42 - 135 ug/dL   TIBC 409 (*) 811 - 914 ug/dL   Saturation Ratios 19 (*) 20 - 55 %   UIBC 193  125 - 400 ug/dL  FERRITIN     Status: Abnormal   Collection Time    01/21/13 11:12 AM      Result Value Range   Ferritin 337 (*) 10 - 291 ng/mL  RETICULOCYTES     Status: None   Collection Time    01/21/13 11:12 AM      Result Value Range   Retic Ct Pct 1.2  0.4 - 3.1 %   RBC. 4.22  3.87 - 5.11 MIL/uL   Retic Count, Manual 50.6  19.0 - 186.0 K/uL       Patient condition at time of discharge/disposition: stable  Disposition-home with home health PT   Follow up issues: 1. Pulmonary HTN - PA pressure of 60 mmHg from 41 in 2011.   2. DM II controlled - Continued Metformin but d/c glipizide due to possible hypoglycemic episodes (A1C at 7.2, consider less than 8 as acceptable)  3. Microcytic Anemia - Most likely anemia of chronic inflammation, consider FOBT  Discharge follow up:   Discharge Orders   Future Appointments Provider Department Dept Phone   01/31/2013 2:30 PM Lonia Skinner, MD [facility] [phone]   06/10/2013 1:15 PM Delcie Roch [facility] [phone]   06/10/2013 1:45 PM Myrtis Ser, NP [facility] [phone]   Future Orders Complete By Expires     Call MD for:  persistant nausea and vomiting  As directed     Call MD for:  redness, tenderness, or signs of infection (pain, swelling, redness, odor or green/yellow discharge around incision site)  As directed     Call MD for:  severe uncontrolled pain  As directed         Discharge Instructions: Please refer to Patient Instructions section of EMR for full  details.  Patient was counseled important signs and symptoms that should prompt return to medical care, changes in medications, dietary instructions, activity restrictions, and follow up appointments.  Significant instructions noted below:    Discharge Medications   Medication List    STOP taking these medications       glipiZIDE 5 MG tablet  Commonly known as:  GLUCOTROL      TAKE these medications       albuterol 108 (90 BASE) MCG/ACT inhaler  Commonly known as:  PROVENTIL HFA;VENTOLIN HFA  Inhale 2 puffs into the lungs every 6 (six) hours as needed. For shortness of breath     amLODipine 10 MG tablet  Commonly known as:  NORVASC  Take 1 tablet (10 mg total) by mouth daily.     ASPIRIN CHILDRENS 81 MG chewable tablet  Generic drug:  aspirin  Chew 81 mg by mouth daily.     carvedilol 25 MG tablet  Commonly known as:  COREG  Take 25 mg by mouth 2 (two) times daily with a meal.     colchicine 0.6 MG tablet  Take 1 tablet (0.6 mg total) by mouth once. Take 2 tablets on 1 day, if still having pain in an hour, take 1 more tablet on day 1. After that take 2 tablets daily for 2 weeks then 1 tablet daily. See  [place] back in 1 week if not improving or if you develop fevers.     fluticasone 50 MCG/ACT nasal spray  Commonly known as:  FLONASE  Place 2 sprays into the nose daily as needed. For nasal congestion     HYDROcodone-acetaminophen 5-325 MG per tablet  Commonly known as:  NORCO/VICODIN  Take 1 tablet by mouth every 6 (six) hours as needed for pain.     losartan 100 MG tablet  Commonly known as:  COZAAR  Take 1 tablet (100 mg total) by mouth daily.     metFORMIN 500 MG tablet  Commonly known as:  GLUCOPHAGE  Take 1,000 mg by mouth 2 (two) times daily with a meal.     PATADAY 0.2 % Soln  Generic drug:  Olopatadine HCl  Place 1 drop into both eyes daily as needed. For itchy eyes     ranitidine 150 MG capsule  Commonly known as:  ZANTAC  Take 1 capsule (150 mg total) by  mouth 2 (two) times daily.     rosuvastatin 20 MG tablet  Commonly known as:  CRESTOR  Take 1 tablet (20 mg total) by mouth at bedtime.     tamoxifen 20 MG tablet  Commonly known as:  NOLVADEX  Take 20 mg by mouth daily.            Gildardo Cranker, DO of [facility] 01/21/2013 9:16 PM

## 2013-01-21 NOTE — Care Management Note (Signed)
   CARE MANAGEMENT NOTE 01/21/2013  Patient:  [name], [name]   Account Number:  [id number]  Date Initiated:  01/21/2013  Documentation initiated by:  Johny Shock  Subjective/Objective Assessment:   Noted CM consult for home health needs, no orders at this time. Will continue to follow.     Action/Plan:   Await specific orders for this pt.   Anticipated DC Date:     Anticipated DC Plan:           Choice offered to / List presented to:             Status of service:  In process, will continue to follow Medicare Important Message given?   (If response is "NO", the following Medicare IM given date fields will be blank) Date Medicare IM given:   Date Additional Medicare IM given:    Discharge Disposition:    Per UR Regulation:    If discussed at Long Length of Stay Meetings, dates discussed:    Comments:

## 2013-01-21 NOTE — Progress Notes (Signed)
Pt transferred from the ED around 0400, admitted to Rm 6732. Pt comes from home with son. She is alert and oriented. Ambulatory with cane and/or walker at home. No skin breakdown noted. Placed on telemetry box 6742, running NSR. Oriented to room, instructed to call for assistance before getting out of bed. Resting comfortably at this time, call call within reach. Bed alarm on, will continue to monitor

## 2013-01-21 NOTE — Progress Notes (Signed)
  Echocardiogram 2D Echocardiogram has been performed.  [name] 01/21/2013, 9:29 AM

## 2013-01-21 NOTE — ED Notes (Signed)
Patient transported to CT 

## 2013-01-21 NOTE — H&P (Signed)
FMTS Attending Admission Note: [name] Levy MD [phone] pager office [phone] I  have seen and examined this patient, reviewed their chart. I have discussed this patient with the resident. I agree with the resident's findings, assessment and care plan. Briefly, she has had 24-36 hours of increased weakness and dyspnea. No fevers, sweats, chills or new pain. Has not been around anyone with acute illness. PMH reviewed including her current use of tamoxifen (now in year 2) and her lab results from recent past: her platelets have bounced around some but are essentially stable as is her hgb.. I was unimpressed by her murmur bid did hear a faint systolic sound---I agree that ECHO is prudent--as is blood culture x at least 2 to eval for endocarditis (although no stigmata of endocarditis on exam). She is comfortable on my exam today.

## 2013-01-21 NOTE — ED Provider Notes (Signed)
History     CSN: [id number]  Arrival date & time 01/20/13  2229   First MD Initiated Contact with Patient 01/20/13 2310      Chief Complaint  Patient presents with  . Shortness of Breath    (Consider location/radiation/quality/duration/timing/severity/associated sxs/prior treatment) HPI Hx per PT - SOB, onset tonight at home while sitting in her chair, no wt gain or leg swelling, trouble laying flat, no CP, no cough or fever/ chills. Has h/o CHF and has had some orthopnea similar to prior CHF.  MOD in severity. Feels better sitting up. No new medications.  Past Medical History  Diagnosis Date  . Breast cancer 10/2010    Invasive Ductal Carcinoma, s/p bilateral mastectomy, now on Tamoxifen. Followed by Dr Dalene Carrow.   . Diabetes mellitus   . Hypertension   . Hyperlipidemia   . Anemia     Iron def anemia  . CAD (coronary artery disease)   . CHF (congestive heart failure)     Systolic   . History of colon cancer   . Neuropathy   . Allergy   . GERD (gastroesophageal reflux disease)   . Hiatal hernia   . Diverticulosis   . Asthma   . Arthritis   . H/O: GI bleed   . Breast cancer 2011    s/p Bilateral masectomy  . Shortness of breath   . Pneumonia 03/2012    Past Surgical History  Procedure Laterality Date  . Breast masectomy  10/2010    Bilateral masectomy by Dr Ezzard Standing s/p invasive ductal carcinoma.  . Cardiac  catherization  2007    Severe 3-vessel disease.  EF 20-25%.  . Esophagogastroduodenoscopy  2001    Esophageal Tear  . Total knee arthroplasty  2001  . Esophagogastroduodenoscopy  10/27/2012    Procedure: ESOPHAGOGASTRODUODENOSCOPY (EGD);  Surgeon: Hilarie Fredrickson, MD;  Location: [facility] ENDOSCOPY;  Service: Endoscopy;  Laterality: N/A;  pat  . Breast surgery      Family History  Problem Relation Age of Onset  . Sickle cell anemia Other   . Heart disease Mother   . Heart disease Father     History  Substance Use Topics  . Smoking status: Never Smoker   .  Smokeless tobacco: Current User    Types: Snuff  . Alcohol Use: No    OB History   Grav Para Term Preterm Abortions TAB SAB Ect Mult Living                  Review of Systems  Constitutional: Negative for fever and chills.  HENT: Negative for neck pain and neck stiffness.   Eyes: Negative for pain.  Respiratory: Positive for shortness of breath.   Cardiovascular: Negative for chest pain.  Gastrointestinal: Negative for nausea, vomiting and abdominal pain.  Genitourinary: Negative for dysuria.  Musculoskeletal: Negative for back pain.  Skin: Negative for rash.  Neurological: Negative for headaches.  All other systems reviewed and are negative.    Allergies  Peanuts and Lisinopril  Home Medications   Current Outpatient Rx  Name  Route  Sig  Dispense  Refill  . acetaminophen (TYLENOL) 500 MG tablet   Oral   Take 1,000 mg by mouth every 6 (six) hours as needed. For pain          . albuterol (PROVENTIL HFA;VENTOLIN HFA) 108 (90 BASE) MCG/ACT inhaler   Inhalation   Inhale 2 puffs into the lungs every 6 (six) hours as needed. For shortness of breath  1 Inhaler   6   . amLODipine (NORVASC) 10 MG tablet   Oral   Take 1 tablet (10 mg total) by mouth daily.   90 tablet   3   . aspirin (ASPIRIN CHILDRENS) 81 MG chewable tablet   Oral   Chew 81 mg by mouth daily.           . benzonatate (TESSALON) 100 MG capsule   Oral   Take 1 capsule (100 mg total) by mouth 3 (three) times daily as needed for cough.   20 capsule   0   . carvedilol (COREG) 25 MG tablet      TAKE 1 TABLET (25 MG TOTAL) BY MOUTH 2 (TWO) TIMES DAILY.   60 tablet   5   . colchicine 0.6 MG tablet   Oral   Take 1 tablet (0.6 mg total) by mouth once. Take 2 tablets on 1 day, if still having pain in an hour, take 1 more tablet on day 1. After that take 2 tablets daily for 2 weeks then 1 tablet daily. See [place] back in 1 week if not improving or if you develop fevers.   60 tablet   0   .  fluticasone (FLONASE) 50 MCG/ACT nasal spray   Nasal   Place 2 sprays into the nose daily as needed. For nasal congestion   16 g   4   . Fluticasone Propionate, Inhal, (FLOVENT DISKUS) 100 MCG/BLIST AEPB   Inhalation   Inhale 1 puff into the lungs 2 (two) times daily.   60 each   6   . glipiZIDE (GLUCOTROL) 5 MG tablet   Oral   Take 1 tablet (5 mg total) by mouth 2 (two) times daily before a meal.   60 tablet   6   . HYDROcodone-acetaminophen (NORCO/VICODIN) 5-325 MG per tablet   Oral   Take 1 tablet by mouth every 6 (six) hours as needed for pain.   30 tablet   0   . losartan (COZAAR) 100 MG tablet   Oral   Take 1 tablet (100 mg total) by mouth daily.   90 tablet   3   . metFORMIN (GLUCOPHAGE) 500 MG tablet   Oral   Take 1,000 mg by mouth 2 (two) times daily with a meal.          . PATADAY 0.2 % SOLN   Both Eyes   Place 1 drop into both eyes daily as needed. For itchy eyes         . polyethylene glycol (MIRALAX / GLYCOLAX) packet   Oral   Take 17 g by mouth daily as needed. For constipation.         . ranitidine (ZANTAC) 150 MG capsule   Oral   Take 1 capsule (150 mg total) by mouth 2 (two) times daily.   180 capsule   6   . ranitidine (ZANTAC) 150 MG capsule      TAKE ONE CAPSULE BY MOUTH TWICE A DAY   60 capsule   6   . rosuvastatin (CRESTOR) 20 MG tablet   Oral   Take 1 tablet (20 mg total) by mouth at bedtime.   90 tablet   6   . sennosides-docusate sodium (SENOKOT-S) 8.6-50 MG tablet   Oral   Take 1 tablet by mouth daily.   30 tablet   0   . tamoxifen (NOLVADEX) 20 MG tablet      TAKE 1 TABLET BY MOUTH EVERY  DAY   90 tablet   3     BP 149/57  Pulse 70  Temp(Src) 98.9 F (37.2 C) (Oral)  Resp 18  SpO2 100%  Physical Exam  Constitutional: She is oriented to person, place, and time. She appears well-developed and well-nourished.  HENT:  Head: Normocephalic and atraumatic.  Eyes: Conjunctivae and EOM are normal. Pupils are  equal, round, and reactive to light.  Neck: Trachea normal. Neck supple. No thyromegaly present.  Cardiovascular: Normal rate, regular rhythm, S1 normal, S2 normal and normal pulses.     No systolic murmur is present   No diastolic murmur is present  Pulses:      Radial pulses are 2+ on the right side, and 2+ on the left side.  Pulmonary/Chest: Effort normal and breath sounds normal. She has no wheezes. She has no rhonchi. She has no rales. She exhibits no tenderness.  Abdominal: Soft. Normal appearance and bowel sounds are normal. There is no tenderness. There is no CVA tenderness and negative Murphy's sign.  Musculoskeletal: Normal range of motion. She exhibits no tenderness.  Calves nontender, 1 plus symetric edema, no cords or erythema, negative Homans sign  Neurological: She is alert and oriented to person, place, and time. She has normal strength. No cranial nerve deficit or sensory deficit. GCS eye subscore is 4. GCS verbal subscore is 5. GCS motor subscore is 6.  Skin: Skin is warm and dry. No rash noted. She is not diaphoretic.  Psychiatric: Her speech is normal.  Cooperative and appropriate    ED Course  Procedures (including critical care time)   Results for orders placed during the hospital encounter of 01/20/13  CBC      Result Value Range   WBC 3.9 (*) 4.0 - 10.5 K/uL   RBC 4.43  3.87 - 5.11 MIL/uL   Hemoglobin 10.1 (*) 12.0 - 15.0 g/dL   HCT 16.1 (*) 09.6 - 04.5 %   MCV 68.2 (*) 78.0 - 100.0 fL   MCH 22.8 (*) 26.0 - 34.0 pg   MCHC 33.4  30.0 - 36.0 g/dL   RDW 40.9  81.1 - 91.4 %   Platelets 103 (*) 150 - 400 K/uL  BASIC METABOLIC PANEL      Result Value Range   Sodium 137  135 - 145 mEq/L   Potassium 3.3 (*) 3.5 - 5.1 mEq/L   Chloride 104  96 - 112 mEq/L   CO2 23  19 - 32 mEq/L   Glucose, Bld 133 (*) 70 - 99 mg/dL   BUN 16  6 - 23 mg/dL   Creatinine, Ser 7.82  0.50 - 1.10 mg/dL   Calcium 95.6 (*) 8.4 - 10.5 mg/dL   GFR calc non Af Amer 50 (*) >90 mL/min    GFR calc Af Amer 58 (*) >90 mL/min  PRO B NATRIURETIC PEPTIDE      Result Value Range   Pro B Natriuretic peptide (BNP) 248.1  0 - 450 pg/mL  GLUCOSE, CAPILLARY      Result Value Range   Glucose-Capillary 142 (*) 70 - 99 mg/dL  GLUCOSE, CAPILLARY      Result Value Range   Glucose-Capillary 79  70 - 99 mg/dL  POCT I-STAT TROPONIN I      Result Value Range   Troponin i, poc 0.01  0.00 - 0.08 ng/mL   Comment 3            Dg Chest 2 View  01/21/2013  *RADIOLOGY REPORT*  Clinical Data: Shortness of breath, history diabetes, hypertension, breast cancer, coronary artery disease, CHF, GERD, asthma  CHEST - 2 VIEW  Comparison: 06/25/2012  Findings: Minimal enlargement cardiac silhouette. Calcified tortuous aorta. Slight pulmonary vascular congestion. No acute failure or consolidation. Minimal chronic subsegmental atelectasis versus scarring left base. Lungs hyperinflated but otherwise clear. No pleural effusion or pneumothorax. Skin folds project over right chest. Bones appear demineralized.  IMPRESSION: Minimal enlargement of cardiac silhouette. No acute abnormalities.   Original Report Authenticated By: Ulyses Southward, M.D.    Ct Head Wo Contrast  01/21/2013  *RADIOLOGY REPORT*  Clinical Data: Sudden onset shortness of breath, fatigue, drowsy  CT HEAD WITHOUT CONTRAST  Technique:  Contiguous axial images were obtained from the base of the skull through the vertex without contrast.  Comparison: 04/26/2011  Findings: Generalized atrophy. Normal ventricle morphology. No midline shift or mass effect. Small vessel chronic ischemic changes of deep cerebral white matter. Small old lacunar infarct at right basal ganglia. No intracranial hemorrhage, mass lesion or evidence of acute infarction. No extra-axial fluid collections. Extensive atherosclerotic calcifications at skull base with minimal mass effect upon the left lateral aspect of the brainstem by the left vertebral artery, Bones and sinuses unremarkable.   IMPRESSION: Atrophy with small vessel chronic ischemic changes deep cerebral white matter. Old right basal ganglia lacunar infarct. No acute intracranial abnormalities.   Original Report Authenticated By: Ulyses Southward, M.D.    Ct Angio Chest Pe W/cm &/or Wo Cm  01/21/2013  *RADIOLOGY REPORT*  Clinical Data: Shortness of breath  CT ANGIOGRAPHY CHEST  Technique:  Multidetector CT imaging of the chest using the standard protocol during bolus administration of intravenous contrast. Multiplanar reconstructed images including MIPs were obtained and reviewed to evaluate the vascular anatomy.  Contrast: OMNIPAQUE IOHEXOL 350 MG/ML SOLN  Comparison: 01/20/2013 radiograph, 11/24/2010 CT  Findings: Bilateral thyroid lobe nodules.  These appear unchanged.  Pulmonary arterial branches are patent.  Scattered atherosclerotic disease of the aorta and branch vessels. Mild contour irregularity/dilatation along the arch.  Normal heart size. Coronary artery calcification.  No pleural or pericardial effusion.  Small hiatal hernia.  No intrathoracic lymphadenopathy.  Limited images through the upper abdomen show no acute finding.  Central airways are patent.  No pneumothorax.  Mild emphysematous changes.  Linear opacity right middle lobe, most in keeping with scarring or atelectasis.  Mild bibasilar opacities, favor atelectasis.  Multilevel degenerative changes of the imaged spine. No acute or aggressive appearing osseous lesion.  IMPRESSION: No pulmonary embolism or acute intrathoracic process identified.  Atherosclerotic disease of the aorta.  Coronary artery calcification.   Original Report Authenticated By: Jearld Lesch, M.D.       Date: 01/21/2013  Rate: 71  Rhythm: normal sinus rhythm  QRS Axis: normal  Intervals: normal  ST/T Wave abnormalities: nonspecific ST/T changes  Conduction Disutrbances:none  Narrative Interpretation:   Old EKG Reviewed: none available  Vital signs within normal limits - no  hypoxia. Patient is very dyspneic when trying to sit up or ambulate. Medicine consult requested. Plan family practice admission  MDM   Severe dyspnea with any exertion. Patient lives alone. Workup as above with EKG, labs and imaging all reviewed  Family practice admit        Sunnie Nielsen, MD 01/21/13 [phone]

## 2013-01-21 NOTE — Progress Notes (Signed)
PGY-1 Daily Progress Note Family Medicine Teaching Service [place] R. Hess, DO Service Pager: [phone]   Subjective: Pt feeling tired, comfortable in bed today.   Objective:  VITALS Temp:  [98 F (36.7 C)-98.9 F (37.2 C)] 98 F (36.7 C) (03/11 0421) Pulse Rate:  [64-79] 64 (03/11 0421) Resp:  [14-18] 18 (03/11 0421) BP: (125-149)/(43-75) 125/43 mmHg (03/11 0421) SpO2:  [98 %-100 %] 98 % (03/11 0421) Weight:  [122 lb 4.8 oz (55.475 kg)] 122 lb 4.8 oz (55.475 kg) (03/11 0421)  In/Out  Intake/Output Summary (Last 24 hours) at 01/21/13 0902 Last data filed at 01/21/13 0428  Gross per 24 hour  Intake      0 ml  Output    100 ml  Net   -100 ml    Physical Exam: General: NAD, comfortable in bed  HEENT: [place]/AT, dry mucous membranes, EOMI B/L, PERRLA  Neck: No JVD, no carotid bruit B/L  Cardiovascular: RRR, 1/6 Systolic murmur along L and R USB  Respiratory: CTA B/L, no wheezing appreciated  Abdomen: Soft, NT/ND, NABS  Extremities: Trace edema B/L, pulses + 2 LE B/L  Skin: No rashes, intact and dry  Neuro: AAO x 3   MEDS Scheduled Meds: . amLODipine  10 mg Oral Daily  . aspirin  81 mg Oral Daily  . atorvastatin  40 mg Oral q1800  . carvedilol  25 mg Oral BID WC  . fluticasone  1 puff Inhalation BID  . heparin  5,000 Units Subcutaneous Q8H  . insulin aspart  0-9 Units Subcutaneous TID WC  . pantoprazole  20 mg Oral Daily  . sodium chloride  3 mL Intravenous Q12H  . tamoxifen  10 mg Oral BID   Continuous Infusions: . sodium chloride 75 mL/hr at 01/21/13 0706   PRN Meds:.acetaminophen, acetaminophen, albuterol, HYDROcodone-acetaminophen, olopatadine, polyethylene glycol  Labs and imaging:   CBC  Recent Labs Lab 01/20/13 2308 01/21/13 0710  WBC 3.9* 3.9*  HGB 10.1* 9.0*  HCT 30.2* 27.3*  PLT 103* 137*   BMET/CMET  Recent Labs Lab 01/20/13 2308 01/21/13 0710  NA 137 138  K 3.3* 3.7  CL 104 105  CO2 23 24  BUN 16 13  CREATININE 0.98 0.87  CALCIUM  10.9* 10.2  PROT  --  6.4  BILITOT  --  0.1*  ALKPHOS  --  32*  ALT  --  5  AST  --  11  GLUCOSE 133* 142*   Results for orders placed during the hospital encounter of 01/20/13 (from the past 24 hour(s))  GLUCOSE, CAPILLARY     Status: Abnormal   Collection Time    01/20/13 11:02 PM      Result Value Range   Glucose-Capillary 142 (*) 70 - 99 mg/dL  CBC     Status: Abnormal   Collection Time    01/20/13 11:08 PM      Result Value Range   WBC 3.9 (*) 4.0 - 10.5 K/uL   RBC 4.43  3.87 - 5.11 MIL/uL   Hemoglobin 10.1 (*) 12.0 - 15.0 g/dL   HCT 45.4 (*) 09.8 - 11.9 %   MCV 68.2 (*) 78.0 - 100.0 fL   MCH 22.8 (*) 26.0 - 34.0 pg   MCHC 33.4  30.0 - 36.0 g/dL   RDW 14.7  82.9 - 56.2 %   Platelets 103 (*) 150 - 400 K/uL  BASIC METABOLIC PANEL     Status: Abnormal   Collection Time    01/20/13 11:08 PM  Result Value Range   Sodium 137  135 - 145 mEq/L   Potassium 3.3 (*) 3.5 - 5.1 mEq/L   Chloride 104  96 - 112 mEq/L   CO2 23  19 - 32 mEq/L   Glucose, Bld 133 (*) 70 - 99 mg/dL   BUN 16  6 - 23 mg/dL   Creatinine, Ser 5.62  0.50 - 1.10 mg/dL   Calcium 13.0 (*) 8.4 - 10.5 mg/dL   GFR calc non Af Amer 50 (*) >90 mL/min   GFR calc Af Amer 58 (*) >90 mL/min  PRO B NATRIURETIC PEPTIDE     Status: None   Collection Time    01/20/13 11:09 PM      Result Value Range   Pro B Natriuretic peptide (BNP) 248.1  0 - 450 pg/mL  POCT I-STAT TROPONIN I     Status: None   Collection Time    01/20/13 11:26 PM      Result Value Range   Troponin i, poc 0.01  0.00 - 0.08 ng/mL   Comment 3           GLUCOSE, CAPILLARY     Status: None   Collection Time    01/21/13  3:56 AM      Result Value Range   Glucose-Capillary 79  70 - 99 mg/dL  CBC     Status: Abnormal   Collection Time    01/21/13  7:10 AM      Result Value Range   WBC 3.9 (*) 4.0 - 10.5 K/uL   RBC 3.91  3.87 - 5.11 MIL/uL   Hemoglobin 9.0 (*) 12.0 - 15.0 g/dL   HCT 86.5 (*) 78.4 - 69.6 %   MCV 69.8 (*) 78.0 - 100.0 fL    MCH 23.0 (*) 26.0 - 34.0 pg   MCHC 33.0  30.0 - 36.0 g/dL   RDW 29.5  28.4 - 13.2 %   Platelets 137 (*) 150 - 400 K/uL  COMPREHENSIVE METABOLIC PANEL     Status: Abnormal   Collection Time    01/21/13  7:10 AM      Result Value Range   Sodium 138  135 - 145 mEq/L   Potassium 3.7  3.5 - 5.1 mEq/L   Chloride 105  96 - 112 mEq/L   CO2 24  19 - 32 mEq/L   Glucose, Bld 142 (*) 70 - 99 mg/dL   BUN 13  6 - 23 mg/dL   Creatinine, Ser 4.40  0.50 - 1.10 mg/dL   Calcium 10.2  8.4 - 72.5 mg/dL   Total Protein 6.4  6.0 - 8.3 g/dL   Albumin 3.1 (*) 3.5 - 5.2 g/dL   AST 11  0 - 37 U/L   ALT 5  0 - 35 U/L   Alkaline Phosphatase 32 (*) 39 - 117 U/L   Total Bilirubin 0.1 (*) 0.3 - 1.2 mg/dL   GFR calc non Af Amer 58 (*) >90 mL/min   GFR calc Af Amer 67 (*) >90 mL/min  TROPONIN I     Status: None   Collection Time    01/21/13  7:15 AM      Result Value Range   Troponin I <0.30  <0.30 ng/mL  GLUCOSE, CAPILLARY     Status: Abnormal   Collection Time    01/21/13  7:42 AM      Result Value Range   Glucose-Capillary 118 (*) 70 - 99 mg/dL   Dg Chest 2 View  01/21/2013  *RADIOLOGY REPORT*  Clinical Data: Shortness of breath, history diabetes, hypertension, breast cancer, coronary artery disease, CHF, GERD, asthma  CHEST - 2 VIEW  Comparison: 06/25/2012  Findings: Minimal enlargement cardiac silhouette. Calcified tortuous aorta. Slight pulmonary vascular congestion. No acute failure or consolidation. Minimal chronic subsegmental atelectasis versus scarring left base. Lungs hyperinflated but otherwise clear. No pleural effusion or pneumothorax. Skin folds project over right chest. Bones appear demineralized.  IMPRESSION: Minimal enlargement of cardiac silhouette. No acute abnormalities.   Original Report Authenticated By: Ulyses Southward, M.D.    Ct Head Wo Contrast  01/21/2013  *RADIOLOGY REPORT*  Clinical Data: Sudden onset shortness of breath, fatigue, drowsy  CT HEAD WITHOUT CONTRAST  Technique:   Contiguous axial images were obtained from the base of the skull through the vertex without contrast.  Comparison: 04/26/2011  Findings: Generalized atrophy. Normal ventricle morphology. No midline shift or mass effect. Small vessel chronic ischemic changes of deep cerebral white matter. Small old lacunar infarct at right basal ganglia. No intracranial hemorrhage, mass lesion or evidence of acute infarction. No extra-axial fluid collections. Extensive atherosclerotic calcifications at skull base with minimal mass effect upon the left lateral aspect of the brainstem by the left vertebral artery, Bones and sinuses unremarkable.  IMPRESSION: Atrophy with small vessel chronic ischemic changes deep cerebral white matter. Old right basal ganglia lacunar infarct. No acute intracranial abnormalities.   Original Report Authenticated By: Ulyses Southward, M.D.    Ct Angio Chest Pe W/cm &/or Wo Cm  01/21/2013  *RADIOLOGY REPORT*  Clinical Data: Shortness of breath  CT ANGIOGRAPHY CHEST  Technique:  Multidetector CT imaging of the chest using the standard protocol during bolus administration of intravenous contrast. Multiplanar reconstructed images including MIPs were obtained and reviewed to evaluate the vascular anatomy.  Contrast: OMNIPAQUE IOHEXOL 350 MG/ML SOLN  Comparison: 01/20/2013 radiograph, 11/24/2010 CT  Findings: Bilateral thyroid lobe nodules.  These appear unchanged.  Pulmonary arterial branches are patent.  Scattered atherosclerotic disease of the aorta and branch vessels. Mild contour irregularity/dilatation along the arch.  Normal heart size. Coronary artery calcification.  No pleural or pericardial effusion.  Small hiatal hernia.  No intrathoracic lymphadenopathy.  Limited images through the upper abdomen show no acute finding.  Central airways are patent.  No pneumothorax.  Mild emphysematous changes.  Linear opacity right middle lobe, most in keeping with scarring or atelectasis.  Mild bibasilar  opacities, favor atelectasis.  Multilevel degenerative changes of the imaged spine. No acute or aggressive appearing osseous lesion.  IMPRESSION: No pulmonary embolism or acute intrathoracic process identified.  Atherosclerotic disease of the aorta.  Coronary artery calcification.   Original Report Authenticated By: Jearld Lesch, M.D.         Assessment/Plan [name] is a 79 y.o. year old female presenting with weakness and dyspnea on exertion   Weakness - Pt with generalized weakness ongoing since early this afternoon.  1. Head CT without acute bleed, CTA without PE, will get 2 D echo today for evaluation for possible AS/AI  2. Last A1C 7.2 on 01/01/13, Lipid Panel 07/10/12 with TC 96, LDL 47.  3. Able to swallow water on exam, carb modified diet 4. PT/OT consult to evaluate. Up with assistance and fall precautions  5. Will get CMET in AM to evaluate for protein and repeat CBC. Consider pre-albumin and nutrition consult.  6. EKG unchanged, troponin negative today  7. UA today,  Blood Cultures pending  2. Dyspnea on exertion - Concern for  Acute Systolic CHF exacerbation upon presentation  1. BNP was 248 on admission. Not fluid overloaded, will monitor I/O closely. Will run NS @ 75 cc/hr as appears dry on exam and will be getting contrast load 2. Wells Score of < 2, ED shows CTA without PE  3. O2 PRN, vitals per unit. Continue home Albuterol and Flovent  4. Systolic murmur heard on exam. Last ECHO done in 2011, will repeat to assess for AS/AI as contributing factor for weakness and dyspnea 3. History of B/L Invasive Ductal Carcinoma Breast CA s/p Masectomy in 2001 - Pt followed by Dr. Gaylyn Rong q 6 months (Last appointment 12/11/12)  1. Continue home tamoxifen  4. HTN - Stable 1. Creatinine stable, will restart ARB today  5. CKD stage III-IV - Creatinine stable at 0.98  1. Continue to monitor 6. Microcytic Anemia - Consistent with iron deficiency anemia  1. Consider iron studies and  pending results, starting on iron supplement  2. Smear consistent with microcytic anemia 3. Hgb decreased from 10.1 to 9.0 this AM.  Continue to monitor, consider FOBT  7. Hyperlipidemia - Continue home crestor 8. DM II, controlled - last A1C of 7.2  1. Will hold home metformin/Glipizide  2. SSI 9. Thrombocytopenia - Pt had platelets of 176 two months ago  1. Could be related to Tamoxifen  2. Stable this AM at 136 from 103 yesterday  10. History of Asthma - Continue home inhalers  11. Osteoarthritis - Continue home Norco  1. Also will decrease tylenol to 325 q 6 hrs 12. Hypokalemia - K+ of 3.3, replenished in ED  1. 3.7 this AM, recheck  13. FEN/GI: Carb Modified, NS @ 75 cc/hr, Protonix  14. Prophylaxis: heparin SQ (monitor platelets)  15. Disposition: Pending improvement  16. Code Status: DNR   Twana First. Paulina Fusi, DO of [facility] 01/21/2013, 9:02 AM

## 2013-01-21 NOTE — H&P (Signed)
Family Medicine [facility] Admission History and Physical Service Pager: [phone]  Patient name: CHRISTEN WARDROP Medical record number: [id number] Date of birth: [birth year] Age: 79 y.o. Gender: female  Primary Care Provider: Marena Chancy, MD  Chief Complaint: weakness, shortness of breath   Assessment and Plan: NALINA YEATMAN is a 79 y.o. year old female presenting with weakness and dyspnea on exertion    Weakness - Pt with generalized weakness ongoing since early this afternoon.  Differential includes TIA, acute CHF exacerbation, pulmonary fibrosis/pneumonitis, cardiac in origin, infection, electrolyte abnormality, anemia, hypoglycemia, dehydration, severe protein calorie malnutrition, breast cancer recurrence  1. Will get Head CT to r/o acute bleed 2. Consider risk stratification labs and further TIA w/u.   Last A1C 7.2 on 01/01/13, Lipid Panel 07/10/12 with TC 96, LDL 47.  3. Able to swallow water on exam, carb modified diet 4. PT/OT consult to evaluate.  Up with assistance and fall precautions  5. Will get CMET in AM to evaluate for protein and repeat CBC.  Consider pre-albumin and nutrition consult.   6. EKG in AM, troponin x 1, telemetry overnight  2. Dyspnea on exertion - Concern for Acute Systolic CHF exacerbation upon presentation  1. BNP was 248 on admission.  Not fluid overloaded, will monitor I/O closely.  Will run NS @ 75 cc/hr as appears dry on exam and will be getting contrast load 2. Consider repeating 2D Echo as last in 08/18/10 was EF of 60-65% with grade on diastolic CHF 3. Wells Score of < 2, ED ordered CTA, follow up results 4. O2 PRN, vitals per unit. Continue home Albuterol and Flovent  5. Systolic murmur heard on exam.  Last ECHO done in 2011, will repeat to assess for AS/AI as contributing factor for weakness and dyspnea 3. History of B/L Invasive Ductal Carcinoma Breast CA s/p Masectomy in 2001 - Pt followed by Dr. Gaylyn Rong q 6 months (Last appointment  12/11/12)  1. Continue home tamoxifen  4. HTN - Stable, continue home meds except for ARB due to contrast load 1. Restart ARB tomorrow 5. CKD stage III-IV - Creatinine stable at 0.98 1. Continue to monitor 6. Microcytic Anemia - Consistent with iron deficiency anemia  1. Consider iron studies and pending results, starting on iron supplement  2. Has more of a pancytopenic picture with Low WBC, Hgb and Plt.  I do not see a medication she is currently on that would be contributing to this.  Check peripheral smear. 7. Hyperlipidemia - Continue home crestor 8. DM II, controlled - last A1C of 7.2 1. Will hold home metformin/Glipizide  2. SSI 9. Thrombocytopenia - Pt had platelets of 176 two months ago 1. Could be related to Tamoxifen  2. Recheck in AM  10. History of Asthma - Continue home inhalers  11. Osteoarthritis - Continue home Norco 1. Also will decrease tylenol to 325 q 6 hrs 12. Hypokalemia - K+ of 3.3, replenished in ED 1. Will recheck in AM  13. FEN/GI: Carb Modified, NS @ 75 cc/hr, Protonix  14. Prophylaxis: heparin SQ (monitor platelets)  15. Disposition: Pending improvement  16. Code Status: DNR   History of Present Illness: JAKEISHA STRICKER is a 79 y.o. year old female presenting with weakness and shortness of breath that started this afternoon.  Pt states she was in her normal state of health up until this afternoon when she was walking to her chair and felt weak, lightheaded, and became short of breath.  She did  not have a syncopal episode and ended up being able to make it to her recliner chair.  She continued to have some shortness of breath while sitting in her chair and felt generalized weak.  She called her daughter around 7 o'clock and at this time was brought to the ED.  In the ED, pt had multiple test done including an EKG showing NSR and unchanged from previous, CXR without acute cardiopulmonary abnormalities, BMP showing K+ 3.3, CBC with Hgb 10.1 (baseline 10-11) and  platelets of 103, BNP of 248.1, POC troponin of 0.01.  She received one dose of KDur.    Upon further evaluation, pt states she had been feeling weak over the last two days, more so than usual.  She denied chest pain/discomfort, chest pain worsening with activity or relieved by rest, HA, focal weakness, worsening edema, orthopnea or PND, fever, chills, sweats, recent weight loss, N/V/D, abdominal pain, recent travel or sick contacts, leg pain, loss of sensation in her extremities, dysphagia, trouble with speech.    She does have history of B/L breast CA s/p mastectomy in 2011 followed by Dr. Gaylyn Rong every 6 months.  She currently lives at home with her son and is able to perform ADL including cooking, grocery shopping, dressing herself, walking with a cane.  She denies tobacco history, alcohol use, or illicit drug use.   Patient Active Problem List  Diagnosis  . DIABETES MELLITUS II, UNCOMPLICATED  . HYPERLIPIDEMIA  . ANEMIA-IRON DEFICIENCY  . NEUROPATHY  . HYPERTENSION, BENIGN SYSTEMIC  . CORONARY, ARTERIOSCLEROSIS  . Chronic systolic heart failure  . Vertebrobasilar artery syndrome  . ASTHMA, PERSISTENT  . Reflux esophagitis  . ARTHRITIS  . Hiatal hernia  . Colon polyp  . Poor circulation  . Fatigue  . History of breast cancer in female, bilateral.  Mastectomies 10/24/2010.  Marland Kitchen Urinary frequency  . Tobacco abuse  . Insomnia  . Weight loss  . Productive cough  . Weakness  . Thoracic back pain  . Constipation  . Musculoskeletal chest pain  . Low back pain  . Gout attack  . Poor social situation  . Stricture and stenosis of esophagus  . Food impaction of esophagus  . Breast cancer  . Foot pain  . Acute upper respiratory infections of unspecified site   Past Medical History: Past Medical History  Diagnosis Date  . Breast cancer 10/2010    Invasive Ductal Carcinoma, s/p bilateral mastectomy, now on Tamoxifen. Followed by Dr Dalene Carrow.   . Diabetes mellitus   . Hypertension   .  Hyperlipidemia   . Anemia     Iron def anemia  . CAD (coronary artery disease)   . CHF (congestive heart failure)     Systolic   . History of colon cancer   . Neuropathy   . Allergy   . GERD (gastroesophageal reflux disease)   . Hiatal hernia   . Diverticulosis   . Asthma   . Arthritis   . H/O: GI bleed   . Breast cancer 2011    s/p Bilateral masectomy  . Shortness of breath   . Pneumonia 03/2012   Past Surgical History: Past Surgical History  Procedure Laterality Date  . Breast masectomy  10/2010    Bilateral masectomy by Dr Ezzard Standing s/p invasive ductal carcinoma.  . Cardiac  catherization  2007    Severe 3-vessel disease.  EF 20-25%.  . Esophagogastroduodenoscopy  2001    Esophageal Tear  . Total knee arthroplasty  2001  .  Esophagogastroduodenoscopy  10/27/2012    Procedure: ESOPHAGOGASTRODUODENOSCOPY (EGD);  Surgeon: Hilarie Fredrickson, MD;  Location: [facility] ENDOSCOPY;  Service: Endoscopy;  Laterality: N/A;  pat  . Breast surgery     Social History: History  Substance Use Topics  . Smoking status: Never Smoker   . Smokeless tobacco: Current User    Types: Snuff  . Alcohol Use: No   For any additional social history documentation, please refer to relevant sections of EMR.  Family History: Family History  Problem Relation Age of Onset  . Sickle cell anemia Other   . Heart disease Mother   . Heart disease Father    Allergies: Allergies  Allergen Reactions  . Peanuts (Nuts) Swelling  . Lisinopril Cough   No current facility-administered medications on file prior to encounter.   Current Outpatient Prescriptions on File Prior to Encounter  Medication Sig Dispense Refill  . acetaminophen (TYLENOL) 500 MG tablet Take 1,000 mg by mouth every 6 (six) hours as needed. For pain       . albuterol (PROVENTIL HFA;VENTOLIN HFA) 108 (90 BASE) MCG/ACT inhaler Inhale 2 puffs into the lungs every 6 (six) hours as needed. For shortness of breath  1 Inhaler  6  . amLODipine (NORVASC)  10 MG tablet Take 1 tablet (10 mg total) by mouth daily.  90 tablet  3  . aspirin (ASPIRIN CHILDRENS) 81 MG chewable tablet Chew 81 mg by mouth daily.        . benzonatate (TESSALON) 100 MG capsule Take 1 capsule (100 mg total) by mouth 3 (three) times daily as needed for cough.  20 capsule  0  . carvedilol (COREG) 25 MG tablet TAKE 1 TABLET (25 MG TOTAL) BY MOUTH 2 (TWO) TIMES DAILY.  60 tablet  5  . colchicine 0.6 MG tablet Take 1 tablet (0.6 mg total) by mouth once. Take 2 tablets on 1 day, if still having pain in an hour, take 1 more tablet on day 1. After that take 2 tablets daily for 2 weeks then 1 tablet daily. See [place] back in 1 week if not improving or if you develop fevers.  60 tablet  0  . fluticasone (FLONASE) 50 MCG/ACT nasal spray Place 2 sprays into the nose daily as needed. For nasal congestion  16 g  4  . Fluticasone Propionate, Inhal, (FLOVENT DISKUS) 100 MCG/BLIST AEPB Inhale 1 puff into the lungs 2 (two) times daily.  60 each  6  . glipiZIDE (GLUCOTROL) 5 MG tablet Take 1 tablet (5 mg total) by mouth 2 (two) times daily before a meal.  60 tablet  6  . HYDROcodone-acetaminophen (NORCO/VICODIN) 5-325 MG per tablet Take 1 tablet by mouth every 6 (six) hours as needed for pain.  30 tablet  0  . losartan (COZAAR) 100 MG tablet Take 1 tablet (100 mg total) by mouth daily.  90 tablet  3  . metFORMIN (GLUCOPHAGE) 500 MG tablet Take 1,000 mg by mouth 2 (two) times daily with a meal.       . PATADAY 0.2 % SOLN Place 1 drop into both eyes daily as needed. For itchy eyes      . polyethylene glycol (MIRALAX / GLYCOLAX) packet Take 17 g by mouth daily as needed. For constipation.      . ranitidine (ZANTAC) 150 MG capsule Take 1 capsule (150 mg total) by mouth 2 (two) times daily.  180 capsule  6  . ranitidine (ZANTAC) 150 MG capsule TAKE ONE CAPSULE BY MOUTH TWICE  A DAY  60 capsule  6  . rosuvastatin (CRESTOR) 20 MG tablet Take 1 tablet (20 mg total) by mouth at bedtime.  90 tablet  6  .  sennosides-docusate sodium (SENOKOT-S) 8.6-50 MG tablet Take 1 tablet by mouth daily.  30 tablet  0  . tamoxifen (NOLVADEX) 20 MG tablet TAKE 1 TABLET BY MOUTH EVERY DAY  90 tablet  3  . [DISCONTINUED] cetirizine (ZYRTEC) 5 MG tablet Take 1 tablet (5 mg total) by mouth daily. Take at night time as it can cause some drowsiness  30 tablet  4   Review Of Systems: Per HPI with the following additions: None Otherwise 12 point review of systems was performed and was unremarkable.  Physical Exam: BP 143/53  Pulse 68  Temp(Src) 98.9 F (37.2 C) (Oral)  Resp 14  SpO2 99% Exam: General: NAD, comfortable in bed HEENT: [place]/AT, dry mucous membranes, EOMI B/L, PERRLA Neck: No JVD, no carotid bruit B/L  Cardiovascular: RRR, 2/6 Systolic murmur along L and R USB Respiratory: CTA B/L, no wheezing appreciated  Abdomen: Soft, NT/ND, NABS Extremities: Trace edema B/L, pulses + 2 LE B/L  Skin: No rashes, intact and dry Neuro: AAO x 3, CN 2-12 intact, no focal neurologic deficits, Romberg negative, Pronator drift negative, finger to nose intact  Labs and Imaging: CBC BMET   Recent Labs Lab 01/20/13 2308  WBC 3.9*  HGB 10.1*  HCT 30.2*  PLT 103*    Recent Labs Lab 01/20/13 2308  NA 137  K 3.3*  CL 104  CO2 23  BUN 16  CREATININE 0.98  GLUCOSE 133*  CALCIUM 10.9*     Gildardo Cranker, DO 01/21/2013, 12:27 AM  PGY-3 Addendum  Patient seen and examined by me along with Dr. Paulina Fusi.  I agree with exam findings, assessment and plan as outlined above with any additions made in Red.  Everrett Coombe, DO

## 2013-01-21 NOTE — Care Management Note (Signed)
UR complete.  Johny Shock RN MPH, case manager, [phone]

## 2013-01-21 NOTE — Evaluation (Signed)
Physical Therapy Evaluation Patient Details Name: [name] MRN: [id number] DOB: 04-27-1924 Today's Date: 01/21/2013 Time: 4098-1191 PT Time Calculation (min): 29 min  PT Assessment / Plan / Recommendation Clinical Impression  79 yo female admitted with weakness and dyspnea who was able to perform bed mobility and walk with RW with min assist.  Daughter, [name], in room states that pt will have family assist as needed. Recommend HHPT at discharge and for pt to use her RW until she gets stronger. No DME is needed    PT Assessment  Patient needs continued PT services    Follow Up Recommendations  Home health PT    Does the patient have the potential to tolerate intense rehabilitation      Barriers to Discharge        Equipment Recommendations  None recommended by PT    Recommendations for Other Services     Frequency      Precautions / Restrictions Restrictions Weight Bearing Restrictions: No   Pertinent Vitals/Pain No c/o pain      Mobility  Bed Mobility Bed Mobility: Supine to Sit Supine to Sit: 4: Min assist Details for Bed Mobility Assistance: needs assist to maneuver on hospital bed Transfers Transfers: Sit to Stand;Stand to Sit Sit to Stand: 4: Min guard Stand to Sit: 4: Min guard Details for Transfer Assistance: cues to push up with arms and use arms to assist with controlled descent to chair Ambulation/Gait Ambulation/Gait Assistance: 4: Min guard Ambulation Distance (Feet): 40 Feet Assistive device: Rolling walker Ambulation/Gait Assistance Details: encouragement , cues for more trunk extension posture, assist to control RW , to step inside RW, and not to leave it behind as she approaches chair Gait Pattern: Decreased step length - right;Decreased step length - left;Trunk flexed;Shuffle Gait velocity: decreased General Gait Details: pt appears fatigued with ambulation, but O2 sats are 99% on RA.   Stairs: No Wheelchair Mobility Wheelchair Mobility: No     Exercises General Exercises - Lower Extremity Ankle Circles/Pumps: AROM;Both;5 reps;Supine Quad Sets: AROM;Both;5 reps;Supine Gluteal Sets: AROM;Both;5 reps;Standing Other Exercises Other Exercises: repeated sit to stand x 5 reps for  strengthening, posture and endurance Other Exercises: instructed to so short bouts of activity throughout the day   PT Diagnosis: Difficulty walking;Abnormality of gait;Generalized weakness  PT Problem List: Decreased strength;Decreased activity tolerance;Decreased mobility PT Treatment Interventions:     PT Goals Acute Rehab PT Goals PT Goal Formulation: With patient/family Time For Goal Achievement: 02/04/13 Potential to Achieve Goals: Good Pt will go Supine/Side to Sit: Independently PT Goal: Supine/Side to Sit - Progress: Goal set today Pt will go Sit to Supine/Side: Independently PT Goal: Sit to Supine/Side - Progress: Goal set today Pt will go Sit to Stand: with modified independence PT Goal: Sit to Stand - Progress: Goal set today Pt will go Stand to Sit: with modified independence PT Goal: Stand to Sit - Progress: Goal set today Pt will Ambulate: >150 feet;with modified independence;with least restrictive assistive device PT Goal: Ambulate - Progress: Goal set today  Visit Information  Last PT Received On: 01/21/13    Subjective Data  Subjective: [name] says there is lots of family around to assist paitent Patient Stated Goal: to go home   Prior Functioning  Home Living Lives With: Son Available Help at Discharge: Family Type of Home: House Home Access: Stairs to enter Secretary/administrator of Steps: 2 Entrance Stairs-Rails: Right;Left Home Layout: One level Home Adaptive Equipment: Bedside commode/3-in-1;Walker - rolling;Quad cane Prior Function  Level of Independence: Independent with assistive device(s) Able to Take Stairs?: Yes Communication Communication: No difficulties    Cognition  Cognition Overall  Cognitive Status: Appears within functional limits for tasks assessed/performed Arousal/Alertness: Awake/alert Orientation Level: Appears intact for tasks assessed Behavior During Session: [facility] for tasks performed    Extremity/Trunk Assessment Right Lower Extremity Assessment RLE ROM/Strength/Tone: [facility] for tasks assessed RLE Sensation: WFL - Light Touch;WFL - Proprioception Left Lower Extremity Assessment LLE ROM/Strength/Tone: WFL for tasks assessed LLE Sensation: WFL - Proprioception;WFL - Light Touch Trunk Assessment Trunk Assessment: Kyphotic   Balance Balance Balance Assessed: Yes Static Sitting Balance Static Sitting - Balance Support: No upper extremity supported Static Sitting - Level of Assistance: 5: Stand by assistance Static Sitting - Comment/# of Minutes: needs encouragement for trunk extension Static Standing Balance Static Standing - Balance Support: Bilateral upper extremity supported;During functional activity Static Standing - Level of Assistance: 5: Stand by assistance  End of Session PT - End of Session Equipment Utilized During Treatment: Gait belt Activity Tolerance: Patient limited by fatigue Patient left: in chair;with family/visitor present  GP Functional Assessment Tool Used: clinical judgement Functional Limitation: Mobility: Walking and moving around Mobility: Walking and Moving Around Current Status ([id number]): At least 20 percent but less than 40 percent impaired, limited or restricted Mobility: Walking and Moving Around Goal Status [phone]): At least 1 percent but less than 20 percent impaired, limited or restricted  Rosey Bath K. [place], [place] [phone] 01/21/2013, 12:00 PM

## 2013-01-22 DIAGNOSIS — I2510 Atherosclerotic heart disease of native coronary artery without angina pectoris: Secondary | ICD-10-CM | POA: Diagnosis not present

## 2013-01-22 LAB — CBC WITH DIFFERENTIAL/PLATELET
Basophils Relative: 0 % (ref 0–1)
Eosinophils Relative: 3 % (ref 0–5)
Lymphs Abs: 1.8 10*3/uL (ref 0.7–4.0)
MCH: 22.8 pg — ABNORMAL LOW (ref 26.0–34.0)
MCV: 69.8 fL — ABNORMAL LOW (ref 78.0–100.0)
Monocytes Absolute: 0.4 10*3/uL (ref 0.1–1.0)
Platelets: 149 10*3/uL — ABNORMAL LOW (ref 150–400)
RBC: 4.04 MIL/uL (ref 3.87–5.11)

## 2013-01-22 LAB — BASIC METABOLIC PANEL
BUN: 11 mg/dL (ref 6–23)
CO2: 22 mEq/L (ref 19–32)
Calcium: 9.9 mg/dL (ref 8.4–10.5)
Glucose, Bld: 129 mg/dL — ABNORMAL HIGH (ref 70–99)
Sodium: 142 mEq/L (ref 135–145)

## 2013-01-22 LAB — GLUCOSE, CAPILLARY

## 2013-01-22 NOTE — Progress Notes (Signed)
OT Cancellation Note  Patient Details Name: [name] MRN: [id number] DOB: May 24, 1924   Cancelled Treatment:    Reason Eval/Treat Not Completed: Other (comment) (Pt to D/C today and per chart has family A prn)  Evette Georges [phone] 01/22/2013, 1:16 PM

## 2013-01-22 NOTE — Progress Notes (Signed)
PGY-1 Daily Progress Note Family Medicine Teaching Service [place] R. Hess, DO Service Pager: [phone]   Subjective: Pt about back to normal this AM, ready to go home.   Objective:  VITALS Temp:  [97.9 F (36.6 C)-98.9 F (37.2 C)] 98.3 F (36.8 C) (03/12 0933) Pulse Rate:  [62-70] 70 (03/12 0933) Resp:  [16-20] 20 (03/12 0933) BP: (105-150)/(47-67) 150/56 mmHg (03/12 0933) SpO2:  [98 %-100 %] 100 % (03/12 0933) Weight:  [125 lb 3.2 oz (56.79 kg)] 125 lb 3.2 oz (56.79 kg) (03/11 2207)  In/Out  Intake/Output Summary (Last 24 hours) at 01/22/13 0953 Last data filed at 01/22/13 0900  Gross per 24 hour  Intake 1347.5 ml  Output    626 ml  Net  721.5 ml    Physical Exam: General: NAD, comfortable in bed  HEENT: [place]/AT, MMM, EOMI B/L, PERRLA  Neck: No JVD, no carotid bruit B/L  Cardiovascular: RRR, 1/6 Systolic murmur along L and R USB  Respiratory: CTA B/L, no wheezing appreciated  Abdomen: Soft, NT/ND, NABS  Extremities: Trace edema B/L, pulses + 2 LE B/L  Skin: No rashes, intact and dry  Neuro: AAO x 3   MEDS Scheduled Meds: . amLODipine  10 mg Oral Daily  . aspirin  81 mg Oral Daily  . atorvastatin  40 mg Oral q1800  . carvedilol  25 mg Oral BID WC  . fluticasone  1 puff Inhalation BID  . heparin  5,000 Units Subcutaneous Q8H  . insulin aspart  0-9 Units Subcutaneous TID WC  . losartan  100 mg Oral Daily  . pantoprazole  20 mg Oral Daily  . sodium chloride  3 mL Intravenous Q12H  . tamoxifen  10 mg Oral BID   Continuous Infusions: . sodium chloride 75 mL/hr at 01/21/13 2039   PRN Meds:.acetaminophen, acetaminophen, albuterol, HYDROcodone-acetaminophen, olopatadine, polyethylene glycol  Labs and imaging:   CBC  Recent Labs Lab 01/20/13 2308 01/21/13 0710 01/22/13 0605  WBC 3.9* 3.9* 4.8  HGB 10.1* 9.0* 9.2*  HCT 30.2* 27.3* 28.2*  PLT 103* 137* 149*   BMET/CMET  Recent Labs Lab 01/20/13 2308 01/21/13 0710 01/22/13 0605  NA 137 138 142  K  3.3* 3.7 3.9  CL 104 105 111  CO2 23 24 22   BUN 16 13 11   CREATININE 0.98 0.87 0.86  CALCIUM 10.9* 10.2 9.9  PROT  --  6.4  --   BILITOT  --  0.1*  --   ALKPHOS  --  32*  --   ALT  --  5  --   AST  --  11  --   GLUCOSE 133* 142* 129*   Results for orders placed during the hospital encounter of 01/20/13 (from the past 24 hour(s))  URINALYSIS, ROUTINE W REFLEX MICROSCOPIC     Status: None   Collection Time    01/21/13 10:23 AM      Result Value Range   Color, Urine YELLOW  YELLOW   APPearance CLEAR  CLEAR   Specific Gravity, Urine 1.030  1.005 - 1.030   pH 7.5  5.0 - 8.0   Glucose, UA NEGATIVE  NEGATIVE mg/dL   Hgb urine dipstick NEGATIVE  NEGATIVE   Bilirubin Urine NEGATIVE  NEGATIVE   Ketones, ur NEGATIVE  NEGATIVE mg/dL   Protein, ur NEGATIVE  NEGATIVE mg/dL   Urobilinogen, UA 0.2  0.0 - 1.0 mg/dL   Nitrite NEGATIVE  NEGATIVE   Leukocytes, UA NEGATIVE  NEGATIVE  VITAMIN B12  Status: None   Collection Time    01/21/13 11:12 AM      Result Value Range   Vitamin B-12 685  211 - 911 pg/mL  FOLATE     Status: None   Collection Time    01/21/13 11:12 AM      Result Value Range   Folate 17.6    IRON AND TIBC     Status: Abnormal   Collection Time    01/21/13 11:12 AM      Result Value Range   Iron 44  42 - 135 ug/dL   TIBC 284 (*) 132 - 440 ug/dL   Saturation Ratios 19 (*) 20 - 55 %   UIBC 193  125 - 400 ug/dL  FERRITIN     Status: Abnormal   Collection Time    01/21/13 11:12 AM      Result Value Range   Ferritin 337 (*) 10 - 291 ng/mL  RETICULOCYTES     Status: None   Collection Time    01/21/13 11:12 AM      Result Value Range   Retic Ct Pct 1.2  0.4 - 3.1 %   RBC. 4.22  3.87 - 5.11 MIL/uL   Retic Count, Manual 50.6  19.0 - 186.0 K/uL  GLUCOSE, CAPILLARY     Status: Abnormal   Collection Time    01/21/13 11:17 AM      Result Value Range   Glucose-Capillary 158 (*) 70 - 99 mg/dL  GLUCOSE, CAPILLARY     Status: Abnormal   Collection Time    01/21/13   4:19 PM      Result Value Range   Glucose-Capillary 129 (*) 70 - 99 mg/dL  GLUCOSE, CAPILLARY     Status: Abnormal   Collection Time    01/21/13  9:56 PM      Result Value Range   Glucose-Capillary 149 (*) 70 - 99 mg/dL  BASIC METABOLIC PANEL     Status: Abnormal   Collection Time    01/22/13  6:05 AM      Result Value Range   Sodium 142  135 - 145 mEq/L   Potassium 3.9  3.5 - 5.1 mEq/L   Chloride 111  96 - 112 mEq/L   CO2 22  19 - 32 mEq/L   Glucose, Bld 129 (*) 70 - 99 mg/dL   BUN 11  6 - 23 mg/dL   Creatinine, Ser 1.02  0.50 - 1.10 mg/dL   Calcium 9.9  8.4 - 72.5 mg/dL   GFR calc non Af Amer 59 (*) >90 mL/min   GFR calc Af Amer 68 (*) >90 mL/min  CBC WITH DIFFERENTIAL     Status: Abnormal   Collection Time    01/22/13  6:05 AM      Result Value Range   WBC 4.8  4.0 - 10.5 K/uL   RBC 4.04  3.87 - 5.11 MIL/uL   Hemoglobin 9.2 (*) 12.0 - 15.0 g/dL   HCT 36.6 (*) 44.0 - 34.7 %   MCV 69.8 (*) 78.0 - 100.0 fL   MCH 22.8 (*) 26.0 - 34.0 pg   MCHC 32.6  30.0 - 36.0 g/dL   RDW 42.5  95.6 - 38.7 %   Platelets 149 (*) 150 - 400 K/uL   Neutrophils Relative 51  43 - 77 %   Lymphocytes Relative 38  12 - 46 %   Monocytes Relative 8  3 - 12 %   Eosinophils Relative 3  0 - 5 %   Basophils Relative 0  0 - 1 %   Neutro Abs 2.5  1.7 - 7.7 K/uL   Lymphs Abs 1.8  0.7 - 4.0 K/uL   Monocytes Absolute 0.4  0.1 - 1.0 K/uL   Eosinophils Absolute 0.1  0.0 - 0.7 K/uL   Basophils Absolute 0.0  0.0 - 0.1 K/uL   RBC Morphology ELLIPTOCYTES    GLUCOSE, CAPILLARY     Status: Abnormal   Collection Time    01/22/13  7:48 AM      Result Value Range   Glucose-Capillary 127 (*) 70 - 99 mg/dL   Comment 1 Documented in Chart     Comment 2 Notify RN     Dg Chest 2 View  01/21/2013  *RADIOLOGY REPORT*  Clinical Data: Shortness of breath, history diabetes, hypertension, breast cancer, coronary artery disease, CHF, GERD, asthma  CHEST - 2 VIEW  Comparison: 06/25/2012  Findings: Minimal enlargement  cardiac silhouette. Calcified tortuous aorta. Slight pulmonary vascular congestion. No acute failure or consolidation. Minimal chronic subsegmental atelectasis versus scarring left base. Lungs hyperinflated but otherwise clear. No pleural effusion or pneumothorax. Skin folds project over right chest. Bones appear demineralized.  IMPRESSION: Minimal enlargement of cardiac silhouette. No acute abnormalities.   Original Report Authenticated By: Ulyses Southward, M.D.    Ct Head Wo Contrast  01/21/2013  *RADIOLOGY REPORT*  Clinical Data: Sudden onset shortness of breath, fatigue, drowsy  CT HEAD WITHOUT CONTRAST  Technique:  Contiguous axial images were obtained from the base of the skull through the vertex without contrast.  Comparison: 04/26/2011  Findings: Generalized atrophy. Normal ventricle morphology. No midline shift or mass effect. Small vessel chronic ischemic changes of deep cerebral white matter. Small old lacunar infarct at right basal ganglia. No intracranial hemorrhage, mass lesion or evidence of acute infarction. No extra-axial fluid collections. Extensive atherosclerotic calcifications at skull base with minimal mass effect upon the left lateral aspect of the brainstem by the left vertebral artery, Bones and sinuses unremarkable.  IMPRESSION: Atrophy with small vessel chronic ischemic changes deep cerebral white matter. Old right basal ganglia lacunar infarct. No acute intracranial abnormalities.   Original Report Authenticated By: Ulyses Southward, M.D.    Ct Angio Chest Pe W/cm &/or Wo Cm  01/21/2013  *RADIOLOGY REPORT*  Clinical Data: Shortness of breath  CT ANGIOGRAPHY CHEST  Technique:  Multidetector CT imaging of the chest using the standard protocol during bolus administration of intravenous contrast. Multiplanar reconstructed images including MIPs were obtained and reviewed to evaluate the vascular anatomy.  Contrast: OMNIPAQUE IOHEXOL 350 MG/ML SOLN  Comparison: 01/20/2013 radiograph,  11/24/2010 CT  Findings: Bilateral thyroid lobe nodules.  These appear unchanged.  Pulmonary arterial branches are patent.  Scattered atherosclerotic disease of the aorta and branch vessels. Mild contour irregularity/dilatation along the arch.  Normal heart size. Coronary artery calcification.  No pleural or pericardial effusion.  Small hiatal hernia.  No intrathoracic lymphadenopathy.  Limited images through the upper abdomen show no acute finding.  Central airways are patent.  No pneumothorax.  Mild emphysematous changes.  Linear opacity right middle lobe, most in keeping with scarring or atelectasis.  Mild bibasilar opacities, favor atelectasis.  Multilevel degenerative changes of the imaged spine. No acute or aggressive appearing osseous lesion.  IMPRESSION: No pulmonary embolism or acute intrathoracic process identified.  Atherosclerotic disease of the aorta.  Coronary artery calcification.   Original Report Authenticated By: Jearld Lesch, M.D.  Assessment/Plan [name] is a 80 y.o. year old female presenting with weakness and dyspnea on exertion   Weakness - Pt with generalized weakness ongoing since early this afternoon.  1. Head CT without acute bleed, CTA without PE, 2 D echo consistent with Pulmonary HTN without LV dysfunction, consider Pulm vs cardiology consult today   2. Last A1C 7.2 on 01/01/13, Lipid Panel 07/10/12 with TC 96, LDL 47.  3. Able to swallow water on exam, carb modified diet 4. PT/OT recommend home health. Up with assistance and fall precautions  5. EKG unchanged, troponin negative today  6. UA negative for infection, BCx pending  2. Dyspnea on exertion - Concern for Acute Systolic CHF exacerbation upon presentation  1. BNP was 248 on admission. Not fluid overloaded, will monitor I/O closely. Will run NS @ 75 cc/hr as appears dry on exam and will be getting contrast load 2. Wells Score of < 2, ED shows CTA without PE  3. O2 PRN, vitals per unit. Continue  home Albuterol and Flovent  4. Systolic murmur heard on exam. Last ECHO done in 2011, echo does not show AS/AI or EF decrease.  Do not believe this is acute CHF>  3. History of B/L Invasive Ductal Carcinoma Breast CA s/p Masectomy in 2001 - Pt followed by Dr. Gaylyn Rong q 6 months (Last appointment 12/11/12)  1. Continue home tamoxifen  4. HTN - Stable 1. Creatinine stable, will restart ARB today  5. CKD stage III-IV - Creatinine stable at 0.98  1. Continue to monitor 6. Microcytic Anemia secondary to Anemia of Chronic Disease/Inflammation-  1. Iron studies consistent with ACD 2. Smear consistent with microcytic anemia 3. Hgb stable around 9.0 today, f/u FOBT 4. Elliptocytes present on morphology, unknown etiology  7. Hyperlipidemia - Continue home crestor 8. DM II, controlled - last A1C of 7.2  1. Will hold home metformin/Glipizide  2. SSI 9. Thrombocytopenia - Pt had platelets of 176 two months ago  1. Could be related to Tamoxifen  2. Stable this AM at 136 from 103 yesterday  10. History of Asthma - Continue home inhalers  11. Osteoarthritis - Continue home Norco  1. Also will decrease tylenol to 325 q 6 hrs 12. Hypokalemia - Resolved 13. FEN/GI: Carb Modified, NS @ 75 cc/hr, Protonix  14. Prophylaxis: heparin SQ (monitor platelets)  15. Disposition: Pending improvement  16. Code Status: DNR   Twana First. Paulina Fusi, DO of [facility] 01/22/2013, 9:53 AM

## 2013-01-22 NOTE — Progress Notes (Signed)
Patient case discussed with resident team and reviewed by me. [name] Compton, MD

## 2013-01-22 NOTE — Progress Notes (Signed)
Pt discharged to home after visit summary reviewed and pt capable of re verbalizing medications and follow up appointments. Pt remains stable. No signs and symptoms of distress. Educated to return to ER in the event of SOB, dizziness, chest pain, or fainting. TATJANA HICKS, RN   

## 2013-01-22 NOTE — Progress Notes (Signed)
FMTS Attending Note Patient seen and examined by me, discussed with Dr Paulina Fusi and I agree with his assessment and plan for today.  [name] states that she is back to her baseline, without pain or dyspnea this morning.  Patient is medical appropriate for discharge, with further outpatient follow up for her microcytic anemia, apparent pulmonary hypertension identified on her ECHO study on this admission.  Paula Compton, MD

## 2013-01-22 NOTE — Progress Notes (Signed)
Physical Therapy Treatment Patient Details Name: [name] MRN: [id number] DOB: 1924-06-24 Today's Date: 01/22/2013 Time: 1478-2956 PT Time Calculation (min): 31 min  PT Assessment / Plan / Recommendation Comments on Treatment Session  Pt made excellent progress and will be ready to d/c home today if ordered. Pt acknowledges that she needs assistance with mobility at home. Pt will have follow-up HHPT to improve independence with functional mobility.    Follow Up Recommendations  Home health PT     Does the patient have the potential to tolerate intense rehabilitation     Barriers to Discharge        Equipment Recommendations  None recommended by PT    Recommendations for Other Services    Frequency     Plan Discharge plan remains appropriate    Precautions / Restrictions Precautions Precautions: Fall   Pertinent Vitals/Pain     Mobility  Bed Mobility Bed Mobility: Not assessed Transfers Transfers: Sit to Stand;Stand to Sit Sit to Stand: 4: Min guard Stand to Sit: 4: Min guard Details for Transfer Assistance: cues for hand placement Ambulation/Gait Ambulation/Gait Assistance: 4: Min assist Ambulation Distance (Feet): 85 Feet Assistive device: 1 person hand held assist Ambulation/Gait Assistance Details: cues to increase step length Gait Pattern: Step-through pattern;Decreased stride length Gait velocity: decreased    Exercises     PT Diagnosis:    PT Problem List:   PT Treatment Interventions:     PT Goals Acute Rehab PT Goals PT Goal: Sit to Stand - Progress: Progressing toward goal PT Goal: Stand to Sit - Progress: Progressing toward goal PT Goal: Ambulate - Progress: Progressing toward goal  Visit Information  Last PT Received On: 01/22/13 Assistance Needed: +1    Subjective Data  Subjective: I will not get up at home without help. Patient Stated Goal: to go home today..   Cognition  Cognition Overall Cognitive Status: Appears within  functional limits for tasks assessed/performed Arousal/Alertness: Awake/alert Orientation Level: Appears intact for tasks assessed Behavior During Session: [facility] for tasks performed    Balance     End of Session PT - End of Session Equipment Utilized During Treatment: Gait belt Activity Tolerance: Patient tolerated treatment well Patient left: in chair Nurse Communication: Mobility status   GP Mobility: Walking and Moving Around Discharge Status [phone]): At least 1 percent but less than 20 percent impaired, limited or restricted   Greggory Stallion 01/22/2013, 11:43 AM

## 2013-01-22 NOTE — Progress Notes (Signed)
Patient selected [facility] for HHPT. Genevieve Norlander called with referral.

## 2013-01-23 NOTE — Progress Notes (Signed)
Visited with pt just before she was discharged.  Pt expressed gratitude for the care she received and that she was feeling better and ready to go home.. Provided spiritual support and prayed with pt and her daughter.  Rutherford Nail Chaplain

## 2013-01-24 NOTE — Discharge Summary (Signed)
FMTS Attending Admission Note: Kehinde Eniola,MD  Patient was seen and examined by Dr Breen, I reviewed their chart. I have discussed this patient with the resident. I agree with the resident's findings, assessment and care plan. 

## 2013-01-27 ENCOUNTER — Telehealth: Payer: Self-pay | Admitting: Family Medicine

## 2013-01-27 DIAGNOSIS — G609 Hereditary and idiopathic neuropathy, unspecified: Secondary | ICD-10-CM | POA: Diagnosis not present

## 2013-01-27 DIAGNOSIS — R269 Unspecified abnormalities of gait and mobility: Secondary | ICD-10-CM | POA: Diagnosis not present

## 2013-01-27 DIAGNOSIS — E119 Type 2 diabetes mellitus without complications: Secondary | ICD-10-CM | POA: Diagnosis not present

## 2013-01-27 DIAGNOSIS — I5023 Acute on chronic systolic (congestive) heart failure: Secondary | ICD-10-CM | POA: Diagnosis not present

## 2013-01-27 DIAGNOSIS — IMO0001 Reserved for inherently not codable concepts without codable children: Secondary | ICD-10-CM | POA: Diagnosis not present

## 2013-01-27 NOTE — Telephone Encounter (Signed)
Left message to call us back. Pt has upcoming appt with Dr.Losq on Friday 01/31/13 at 2:30 pm to f/up on Hospital stay. Lorenda Hatchet, Renato Battles

## 2013-01-27 NOTE — Telephone Encounter (Signed)
Verbal order is ok for for PT 2wk4 - balance, ambulation, strength and transfers.  Discharge paperwork from the hospital says to stop taking Glipizide, but the patient refuses to stop because she has been taking it for years and years.  The paperwork says to continue the gout medicine, but she isn't because she doesn't have medication.

## 2013-01-28 LAB — CULTURE, BLOOD (ROUTINE X 2): Culture: NO GROWTH

## 2013-01-28 NOTE — Telephone Encounter (Signed)
Did not receive call back. Please see previous message. Thanks. Lorenda Hatchet, Renato Battles

## 2013-01-29 DIAGNOSIS — I5023 Acute on chronic systolic (congestive) heart failure: Secondary | ICD-10-CM | POA: Diagnosis not present

## 2013-01-29 DIAGNOSIS — IMO0001 Reserved for inherently not codable concepts without codable children: Secondary | ICD-10-CM | POA: Diagnosis not present

## 2013-01-31 ENCOUNTER — Ambulatory Visit (INDEPENDENT_AMBULATORY_CARE_PROVIDER_SITE_OTHER): Payer: Medicare Other | Admitting: Family Medicine

## 2013-01-31 ENCOUNTER — Encounter: Payer: Self-pay | Admitting: Family Medicine

## 2013-01-31 VITALS — BP 152/77 | HR 80 | Ht 62.0 in | Wt 123.7 lb

## 2013-01-31 DIAGNOSIS — K5900 Constipation, unspecified: Secondary | ICD-10-CM

## 2013-01-31 DIAGNOSIS — Z09 Encounter for follow-up examination after completed treatment for conditions other than malignant neoplasm: Secondary | ICD-10-CM

## 2013-01-31 DIAGNOSIS — M545 Low back pain, unspecified: Secondary | ICD-10-CM | POA: Diagnosis not present

## 2013-01-31 DIAGNOSIS — Z853 Personal history of malignant neoplasm of breast: Secondary | ICD-10-CM | POA: Diagnosis not present

## 2013-01-31 DIAGNOSIS — E119 Type 2 diabetes mellitus without complications: Secondary | ICD-10-CM

## 2013-01-31 LAB — CBC WITH DIFFERENTIAL/PLATELET
Basophils Absolute: 0 10*3/uL (ref 0.0–0.1)
Basophils Relative: 0 % (ref 0–1)
Eosinophils Relative: 2 % (ref 0–5)
HCT: 32 % — ABNORMAL LOW (ref 36.0–46.0)
Hemoglobin: 10 g/dL — ABNORMAL LOW (ref 12.0–15.0)
Lymphocytes Relative: 30 % (ref 12–46)
MCHC: 31.3 g/dL (ref 30.0–36.0)
MCV: 72.2 fL — ABNORMAL LOW (ref 78.0–100.0)
Monocytes Absolute: 0.4 10*3/uL (ref 0.1–1.0)
Monocytes Relative: 5 % (ref 3–12)
Neutro Abs: 4.5 10*3/uL (ref 1.7–7.7)
RDW: 17.1 % — ABNORMAL HIGH (ref 11.5–15.5)

## 2013-01-31 MED ORDER — FLUTICASONE PROPIONATE 50 MCG/ACT NA SUSP: 2.0000 | Freq: Every day | NASAL | Status: DC

## 2013-01-31 MED ORDER — ROSUVASTATIN CALCIUM 20 MG PO TABS: 20.0000 mg | ORAL_TABLET | Freq: Every day | ORAL | Status: DC

## 2013-01-31 MED ORDER — HYDROCODONE-ACETAMINOPHEN 5-325 MG PO TABS: 1.0000 | ORAL_TABLET | Freq: Four times a day (QID) | ORAL | Status: DC | PRN

## 2013-01-31 MED ORDER — ALBUTEROL SULFATE HFA 108 (90 BASE) MCG/ACT IN AERS: 2.0000 | INHALATION_SPRAY | Freq: Four times a day (QID) | RESPIRATORY_TRACT | Status: DC | PRN

## 2013-01-31 NOTE — Progress Notes (Signed)
Patient ID: CHELE CORNELL    DOB: 12-31-1923, 79 y.o.   MRN: [id number] --- Subjective:  Devlin is a 79 y.o.female who presents for follow up from hospitalization on 01/21/13 for trouble breathing and fatigue.  - hospitalization: Reports that since discharge from the hospital, she feels much better, with resolution of symptoms.  In hospital, had echo done which showed EF: 65-70% and grade 1 diastolic dysfunction. PA pressure: 60mm Hg, elevated LV filling pressure.  Also found to have chronic microcytic anemia with normal B12, normal folate, normal iron, low TIBC and elevated ferritin.  Patient's glipizide was stopped on discharge due to concern of hypoglycemia. Patient has continued taking it since she has been on it for a long time. She denies any hypoglycemic episodes.   - chronic pain around surgical site of mastectomy. Continues to have daily sharp shooting pain, more under right breastbone, radiating to back. vicodin helps. Takes one a day. No side effects from medicine other than constipation. No dizziness, no sleepiness.  Uses ex-lax for constipation which helps. Feels like miralax doesn't help.    ROS: Denies any shortness of breath, any chest pain. Continues to live at home with her son.  Past Medical History: reviewed and updated medications and allergies. Social History: Tobacco: chews tobacco  Objective: Filed Vitals:   01/31/13 1418  BP: 152/77  Pulse: 80    Physical Examination:   General appearance - alert, well appearing, and in no distress Chest - clear to auscultation, no wheezes, rales or rhonchi, symmetric air entry Heart - normal rate, regular rhythm, normal S1, S2, no murmurs, rubs, clicks or gallops Abdomen - soft, nontender, nondistended, no masses or organomegaly MSK - no tenderness to palpation along right chest wall and back.

## 2013-01-31 NOTE — Patient Instructions (Addendum)
We will check your blood count and see how it's doing compared to when you were in the hospital.   Let's stop the glipizide for now. We will check your sugar level with an A1C in May.   Follow up in 1 month.

## 2013-02-01 DIAGNOSIS — Z09 Encounter for follow-up examination after completed treatment for conditions other than malignant neoplasm: Secondary | ICD-10-CM | POA: Insufficient documentation

## 2013-02-01 NOTE — Assessment & Plan Note (Signed)
A1C of 7.2 in February. Recommended stopping glipizide for now to avoid hypoglycemic episodes. Patient agreed to this.

## 2013-02-01 NOTE — Assessment & Plan Note (Signed)
Refilled vicodin 5/325mg  qd/prn 30 tablets no refills for pain along incision sites. Follow up in 1 month

## 2013-02-01 NOTE — Assessment & Plan Note (Signed)
Continue hydration and laxative/fiber use. Encouraged increased dietary fiber such as dried prunes, which patient was amenable to.

## 2013-02-01 NOTE — Assessment & Plan Note (Signed)
Patient's hospitalization symptoms appear to have resolved. Unclear etiology at this time. Will follow up on anemia with CBC today. Given iron studies, likely anemia of chronic disease.

## 2013-02-03 ENCOUNTER — Encounter: Payer: Self-pay | Admitting: Family Medicine

## 2013-02-06 DIAGNOSIS — R269 Unspecified abnormalities of gait and mobility: Secondary | ICD-10-CM | POA: Diagnosis not present

## 2013-02-06 DIAGNOSIS — G609 Hereditary and idiopathic neuropathy, unspecified: Secondary | ICD-10-CM | POA: Diagnosis not present

## 2013-02-06 DIAGNOSIS — I5023 Acute on chronic systolic (congestive) heart failure: Secondary | ICD-10-CM | POA: Diagnosis not present

## 2013-02-06 DIAGNOSIS — IMO0001 Reserved for inherently not codable concepts without codable children: Secondary | ICD-10-CM | POA: Diagnosis not present

## 2013-02-06 DIAGNOSIS — E119 Type 2 diabetes mellitus without complications: Secondary | ICD-10-CM | POA: Diagnosis not present

## 2013-02-12 DIAGNOSIS — E119 Type 2 diabetes mellitus without complications: Secondary | ICD-10-CM | POA: Diagnosis not present

## 2013-02-12 DIAGNOSIS — G609 Hereditary and idiopathic neuropathy, unspecified: Secondary | ICD-10-CM | POA: Diagnosis not present

## 2013-02-12 DIAGNOSIS — I5023 Acute on chronic systolic (congestive) heart failure: Secondary | ICD-10-CM | POA: Diagnosis not present

## 2013-02-12 DIAGNOSIS — R269 Unspecified abnormalities of gait and mobility: Secondary | ICD-10-CM | POA: Diagnosis not present

## 2013-02-12 DIAGNOSIS — IMO0001 Reserved for inherently not codable concepts without codable children: Secondary | ICD-10-CM | POA: Diagnosis not present

## 2013-02-13 DIAGNOSIS — IMO0001 Reserved for inherently not codable concepts without codable children: Secondary | ICD-10-CM | POA: Diagnosis not present

## 2013-02-13 DIAGNOSIS — R269 Unspecified abnormalities of gait and mobility: Secondary | ICD-10-CM | POA: Diagnosis not present

## 2013-02-13 DIAGNOSIS — G609 Hereditary and idiopathic neuropathy, unspecified: Secondary | ICD-10-CM | POA: Diagnosis not present

## 2013-02-13 DIAGNOSIS — E119 Type 2 diabetes mellitus without complications: Secondary | ICD-10-CM | POA: Diagnosis not present

## 2013-02-13 DIAGNOSIS — I5023 Acute on chronic systolic (congestive) heart failure: Secondary | ICD-10-CM | POA: Diagnosis not present

## 2013-02-19 ENCOUNTER — Ambulatory Visit (INDEPENDENT_AMBULATORY_CARE_PROVIDER_SITE_OTHER): Payer: Medicare Other | Admitting: Family Medicine

## 2013-02-19 ENCOUNTER — Encounter: Payer: Self-pay | Admitting: Family Medicine

## 2013-02-19 VITALS — BP 134/64 | Ht 62.0 in | Wt 124.0 lb

## 2013-02-19 DIAGNOSIS — I10 Essential (primary) hypertension: Secondary | ICD-10-CM | POA: Diagnosis not present

## 2013-02-19 DIAGNOSIS — R609 Edema, unspecified: Secondary | ICD-10-CM

## 2013-02-19 DIAGNOSIS — R600 Localized edema: Secondary | ICD-10-CM | POA: Insufficient documentation

## 2013-02-19 LAB — BASIC METABOLIC PANEL
BUN: 13 mg/dL (ref 6–23)
Chloride: 104 mEq/L (ref 96–112)
Creat: 1.03 mg/dL (ref 0.50–1.10)
Glucose, Bld: 295 mg/dL — ABNORMAL HIGH (ref 70–99)
Potassium: 3.8 mEq/L (ref 3.5–5.3)

## 2013-02-19 MED ORDER — HYDROCHLOROTHIAZIDE 25 MG PO TABS: 25.0000 mg | ORAL_TABLET | Freq: Every day | ORAL | Status: DC

## 2013-02-19 NOTE — Patient Instructions (Addendum)
STOP Norvasc START HCTZ 25 mg (take in the morning)  Follow-up in 1 week with Dr. Gwenlyn Saran

## 2013-02-19 NOTE — Progress Notes (Signed)
  Subjective:    Patient ID: [name], female    DOB: [birth year], 79 y.o.   MRN: [id number]  HPI # Her right heel started burning on 4/6; this is improved but since then and more particularly yesterday, she felt her legs were so big they were going to bust. Along with her thighs and her knees.  Diet changes? No Denies trauma, chest pain, difficulty breathing New activities? Physical therapy for the past 3 weeks and she reports her "legs have been giving me trouble ever since".   Review of Systems Per HPI Denies blood in stool or bleeding  Allergies, medication, past medical history reviewed.  Smoking status noted. Asthma, persistent--she has been using albuterol more than usual this week due to sinus problems Arthritis Chronic systolic HF--she has not seen cardiologist yet; she was supposedly referred at [facility]; about 1.5 months ago   ECHO: 65-70%, grade 1 diastolic  CAD T2DM Gout H/o breast cancer HTN Tobacco--daily snuff Vertebrobasilar artery syndrome Anemia--Hgb 10.0 03/21    Objective:   Physical Exam GEN: NAD; frail but well-appearing; elderly; well-nourished PSYCH: pleasant, accompanied by daughter but patient is appropriate to all questions CV: RRR PULM: NI WOB; soft bibasilar crackles bilaterally EXT: 1+ pitting pretibial edema to knees; swelling around ankles; no reproducible heel pain, including along Achilles tendon and plantar fascia; tender along tibia without calf swelling NEURO: moves all extremities well; slow gait, uses walker  Cr 0.86 (01/22/2013)      Assessment & Plan:

## 2013-02-19 NOTE — Assessment & Plan Note (Addendum)
She has a history of diastolic. She also probably has venous insufficiency contributing. She had been on HCTZ in the past but this had been discontinued (medication records show she was on it 2012, 2013). Her blood pressure is fine today.  -We will stop Norvasc and restart HCTZ.  -Check K, Cr, pro-BNP>>>BMET okay except for Glc 290s. ProBNP 400s, which is not unusual for her.  -Follow-up in 1 week

## 2013-02-20 DIAGNOSIS — G609 Hereditary and idiopathic neuropathy, unspecified: Secondary | ICD-10-CM | POA: Diagnosis not present

## 2013-02-20 DIAGNOSIS — IMO0001 Reserved for inherently not codable concepts without codable children: Secondary | ICD-10-CM | POA: Diagnosis not present

## 2013-02-20 DIAGNOSIS — E119 Type 2 diabetes mellitus without complications: Secondary | ICD-10-CM | POA: Diagnosis not present

## 2013-02-20 DIAGNOSIS — I5023 Acute on chronic systolic (congestive) heart failure: Secondary | ICD-10-CM | POA: Diagnosis not present

## 2013-02-20 DIAGNOSIS — R269 Unspecified abnormalities of gait and mobility: Secondary | ICD-10-CM | POA: Diagnosis not present

## 2013-03-03 ENCOUNTER — Telehealth: Payer: Self-pay | Admitting: Family Medicine

## 2013-03-03 DIAGNOSIS — M545 Low back pain: Secondary | ICD-10-CM

## 2013-03-03 NOTE — Telephone Encounter (Signed)
Will route request to Dr. Gwenlyn Saran and call patient back.  Gaylene Brooks, RN

## 2013-03-03 NOTE — Telephone Encounter (Signed)
Pt is out of her hydrocodone and has appt this Friday - wants to know if she can have enough to last until appt.

## 2013-03-04 MED ORDER — HYDROCODONE-ACETAMINOPHEN 5-325 MG PO TABS: 1.0000 | ORAL_TABLET | Freq: Four times a day (QID) | ORAL | Status: DC | PRN

## 2013-03-04 NOTE — Telephone Encounter (Signed)
Called in Rx for vicodin 5/325 q6/prn pain 30 tablets no refills.   Marena Chancy, PGY-2 Family Medicine Resident

## 2013-03-04 NOTE — Telephone Encounter (Signed)
Patient informed that med was refilled and to check with pharmacy.  Gaylene Brooks, RN

## 2013-03-07 ENCOUNTER — Ambulatory Visit (INDEPENDENT_AMBULATORY_CARE_PROVIDER_SITE_OTHER): Payer: Medicare Other | Admitting: Family Medicine

## 2013-03-07 ENCOUNTER — Encounter: Payer: Self-pay | Admitting: Family Medicine

## 2013-03-07 VITALS — BP 132/68 | HR 72 | Ht 62.0 in | Wt 118.0 lb

## 2013-03-07 DIAGNOSIS — R200 Anesthesia of skin: Secondary | ICD-10-CM

## 2013-03-07 DIAGNOSIS — G4700 Insomnia, unspecified: Secondary | ICD-10-CM

## 2013-03-07 DIAGNOSIS — R609 Edema, unspecified: Secondary | ICD-10-CM

## 2013-03-07 DIAGNOSIS — M65311 Trigger thumb, right thumb: Secondary | ICD-10-CM

## 2013-03-07 DIAGNOSIS — R600 Localized edema: Secondary | ICD-10-CM

## 2013-03-07 DIAGNOSIS — R209 Unspecified disturbances of skin sensation: Secondary | ICD-10-CM | POA: Diagnosis not present

## 2013-03-07 DIAGNOSIS — Z853 Personal history of malignant neoplasm of breast: Secondary | ICD-10-CM | POA: Diagnosis not present

## 2013-03-07 DIAGNOSIS — M65319 Trigger thumb, unspecified thumb: Secondary | ICD-10-CM

## 2013-03-07 DIAGNOSIS — M6530 Trigger finger, unspecified finger: Secondary | ICD-10-CM | POA: Diagnosis not present

## 2013-03-07 DIAGNOSIS — M545 Low back pain, unspecified: Secondary | ICD-10-CM | POA: Diagnosis not present

## 2013-03-07 DIAGNOSIS — M79604 Pain in right leg: Secondary | ICD-10-CM | POA: Insufficient documentation

## 2013-03-07 MED ORDER — TRAZODONE HCL 50 MG PO TABS: 25.0000 mg | ORAL_TABLET | Freq: Every evening | ORAL | Status: DC | PRN

## 2013-03-07 MED ORDER — HYDROCODONE-ACETAMINOPHEN 5-325 MG PO TABS: 1.0000 | ORAL_TABLET | Freq: Four times a day (QID) | ORAL | Status: DC | PRN

## 2013-03-07 NOTE — Assessment & Plan Note (Signed)
Patient states that trazadone has helped her in the past with sleep.  Will try trazadone and monitor effect.

## 2013-03-07 NOTE — Patient Instructions (Signed)
For the leg, we're going to watch it and treat it with the vicodin and tylenol. You can take tylenol one tablet over the counter every 6 hours.   For the trigger thumb, we will try a splint to immobilize it. If in 6 weeks it isn't better, you can go to sports medicine for an injection.   For the sleep, I am sending a prescription for trazadone to take at night.

## 2013-03-07 NOTE — Progress Notes (Signed)
Patient ID: [name]    DOB: [birth year], 79 y.o.   MRN: [id number] --- Subjective:  [name] is a 79 y.o.female who presents for follow up on leg swelling. Now having right leg numbness for 4 days and pain in her right knee.  Pain is located in the front of the knee, worst at night when she positions her leg in a certain way. Pain not increased with walking and not better with stopping.  Feeling of numbness in entire leg and foot, "feels dead". Worst with walking. No swelling.  Has been able to ambulate at home without a cane like she normally does. Feels like her right leg gives out occasionally.  She had a right knee replacement in 2002 - trigger thumb: has been present for 6 months. Pops with bending which is painful. She has been keeping it straight.   - lower extremity swelling: stopped amlodipine and started back HCTZ which has helped with the swelling.   - sleep: difficulty falling asleep. Not sleeping more than 4-5 hours a night. Dozes off during the day but states that when she goes to bed, she can't sleep. Wakes up in the middle of the night to urinate.   ROS: see HPI Past Medical History: reviewed and updated medications and allergies. Social History: Tobacco: chews tobacco  Objective: Filed Vitals:   03/07/13 1341  BP: 132/68  Pulse: 72    Physical Examination:   General appearance - alert, well appearing, and in no distress Chest - clear to auscultation, no wheezes, rales or rhonchi, symmetric air entry Heart - normal rate, regular rhythm, normal S1, S2, no murmurs, rubs, clicks or gallops Extremities - 2+ dorsalis pedis pulse bilaterally, normal microfilament testing bilaterally, normal sensation to light touch. 5/5 strength with knee flexion, extension, dorsiflexion and plantarflexion bilaterally.  No edema bilaterally. No point tenderness along right knee. Normal range of motion of knee and hip without reproducibility of symptoms.  Right thumb: pops out with flexion,  no point tenderness along joint lines

## 2013-03-07 NOTE — Assessment & Plan Note (Addendum)
Differential includes peripheral vascular disease vs diabetic neuropathy vs claudication vs referred numbness from knee, hip or back vs metabolic effect Dorsalis pedis pulses palpable bilaterally showing normal circulation and making claudication from PVD less likely. Will hold on obtaining ABI's for now.  Normal sensation to light touch and microfilament making diabetic neuropathy less likely. No evidence of swelling or joint effusion. No back pain and no hip or groin pain making referred process less likely.  If not better, consider checking B12 and TSH Will treat as strain with pain her chronic vicodin and tylenol. Return to clinic if worst or not better.

## 2013-03-07 NOTE — Assessment & Plan Note (Addendum)
Refilled vicodin to be filled on 04/01/13 with 2 refills for chronic pain along incisions sites

## 2013-03-07 NOTE — Assessment & Plan Note (Addendum)
Resolved after amlodipine was stopped

## 2013-03-07 NOTE — Assessment & Plan Note (Signed)
Rx for SPICA splint for immobilization.  If after 6 weeks not better, consider referral to sports medicine for injection.

## 2013-03-13 IMAGING — CR DG CHEST 2V
2 series · 2 of 2 positions shown · non-contrast
Comparison: 06/25/2012

CLINICAL DATA: Shortness of breath, history diabetes, hypertension,
breast cancer, coronary artery disease, CHF, GERD, asthma

CHEST - 2 VIEW

[x chest ap]
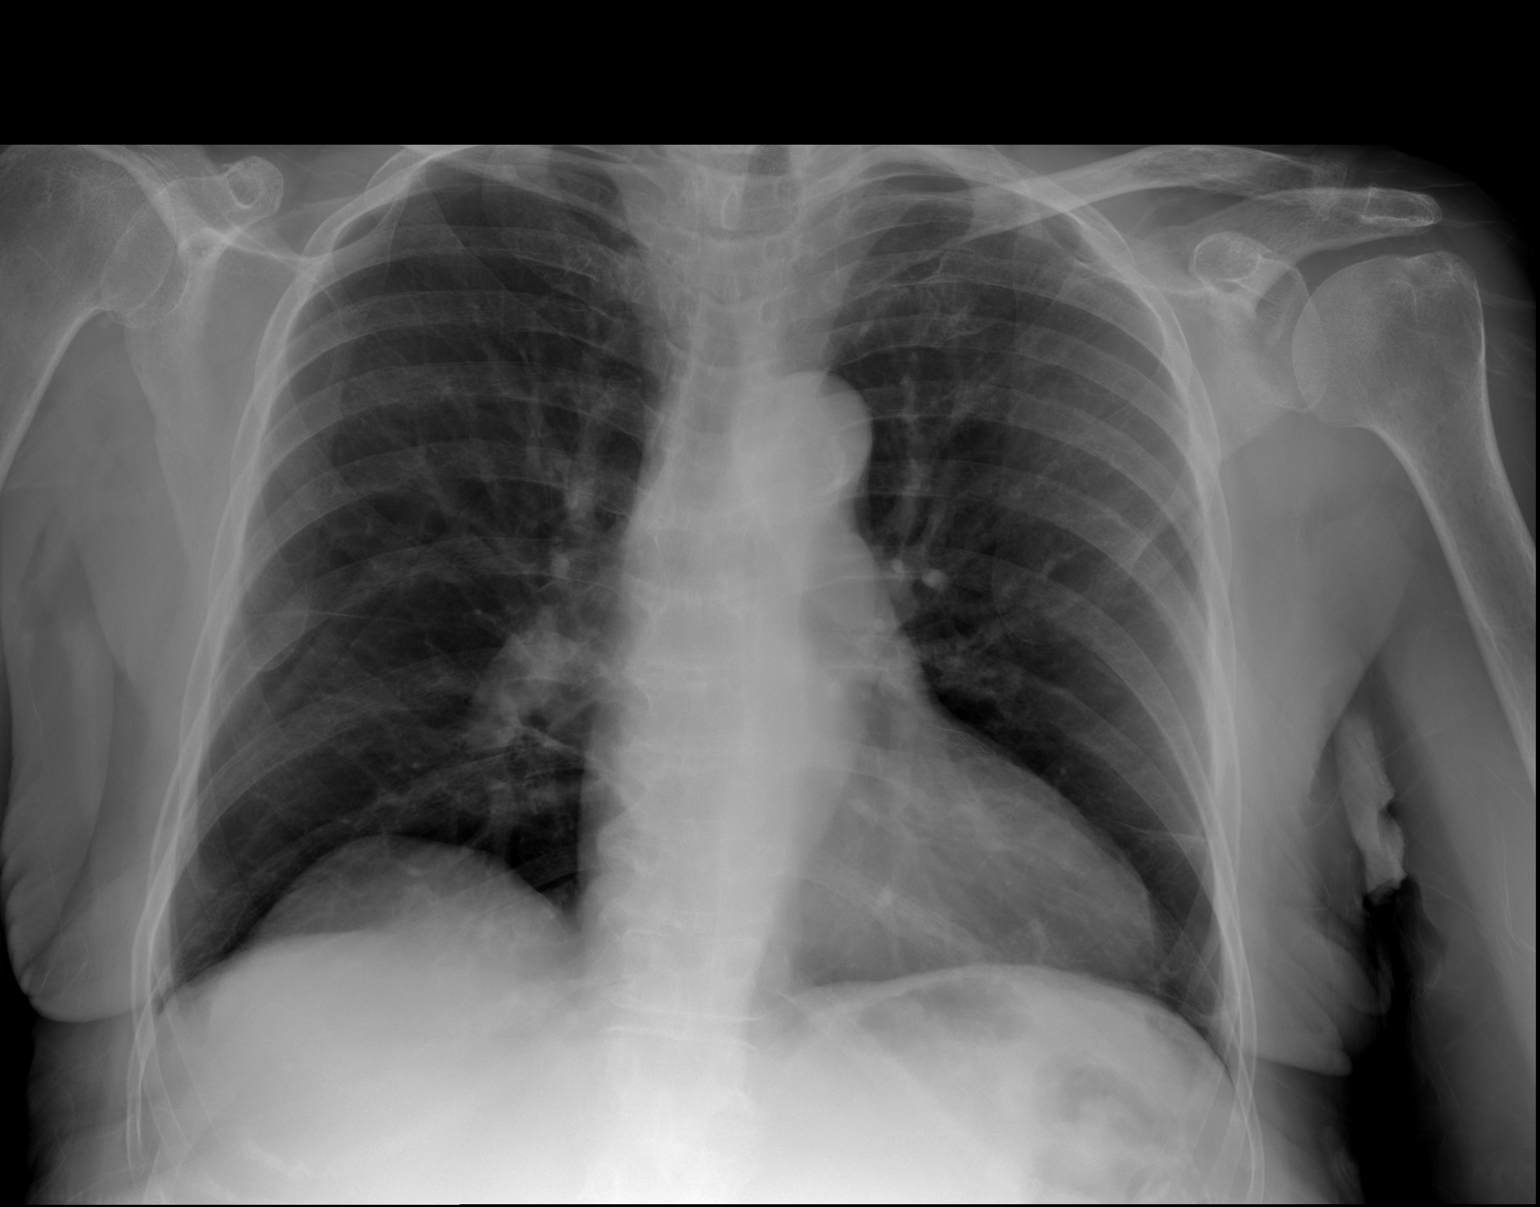

[w chest lat]
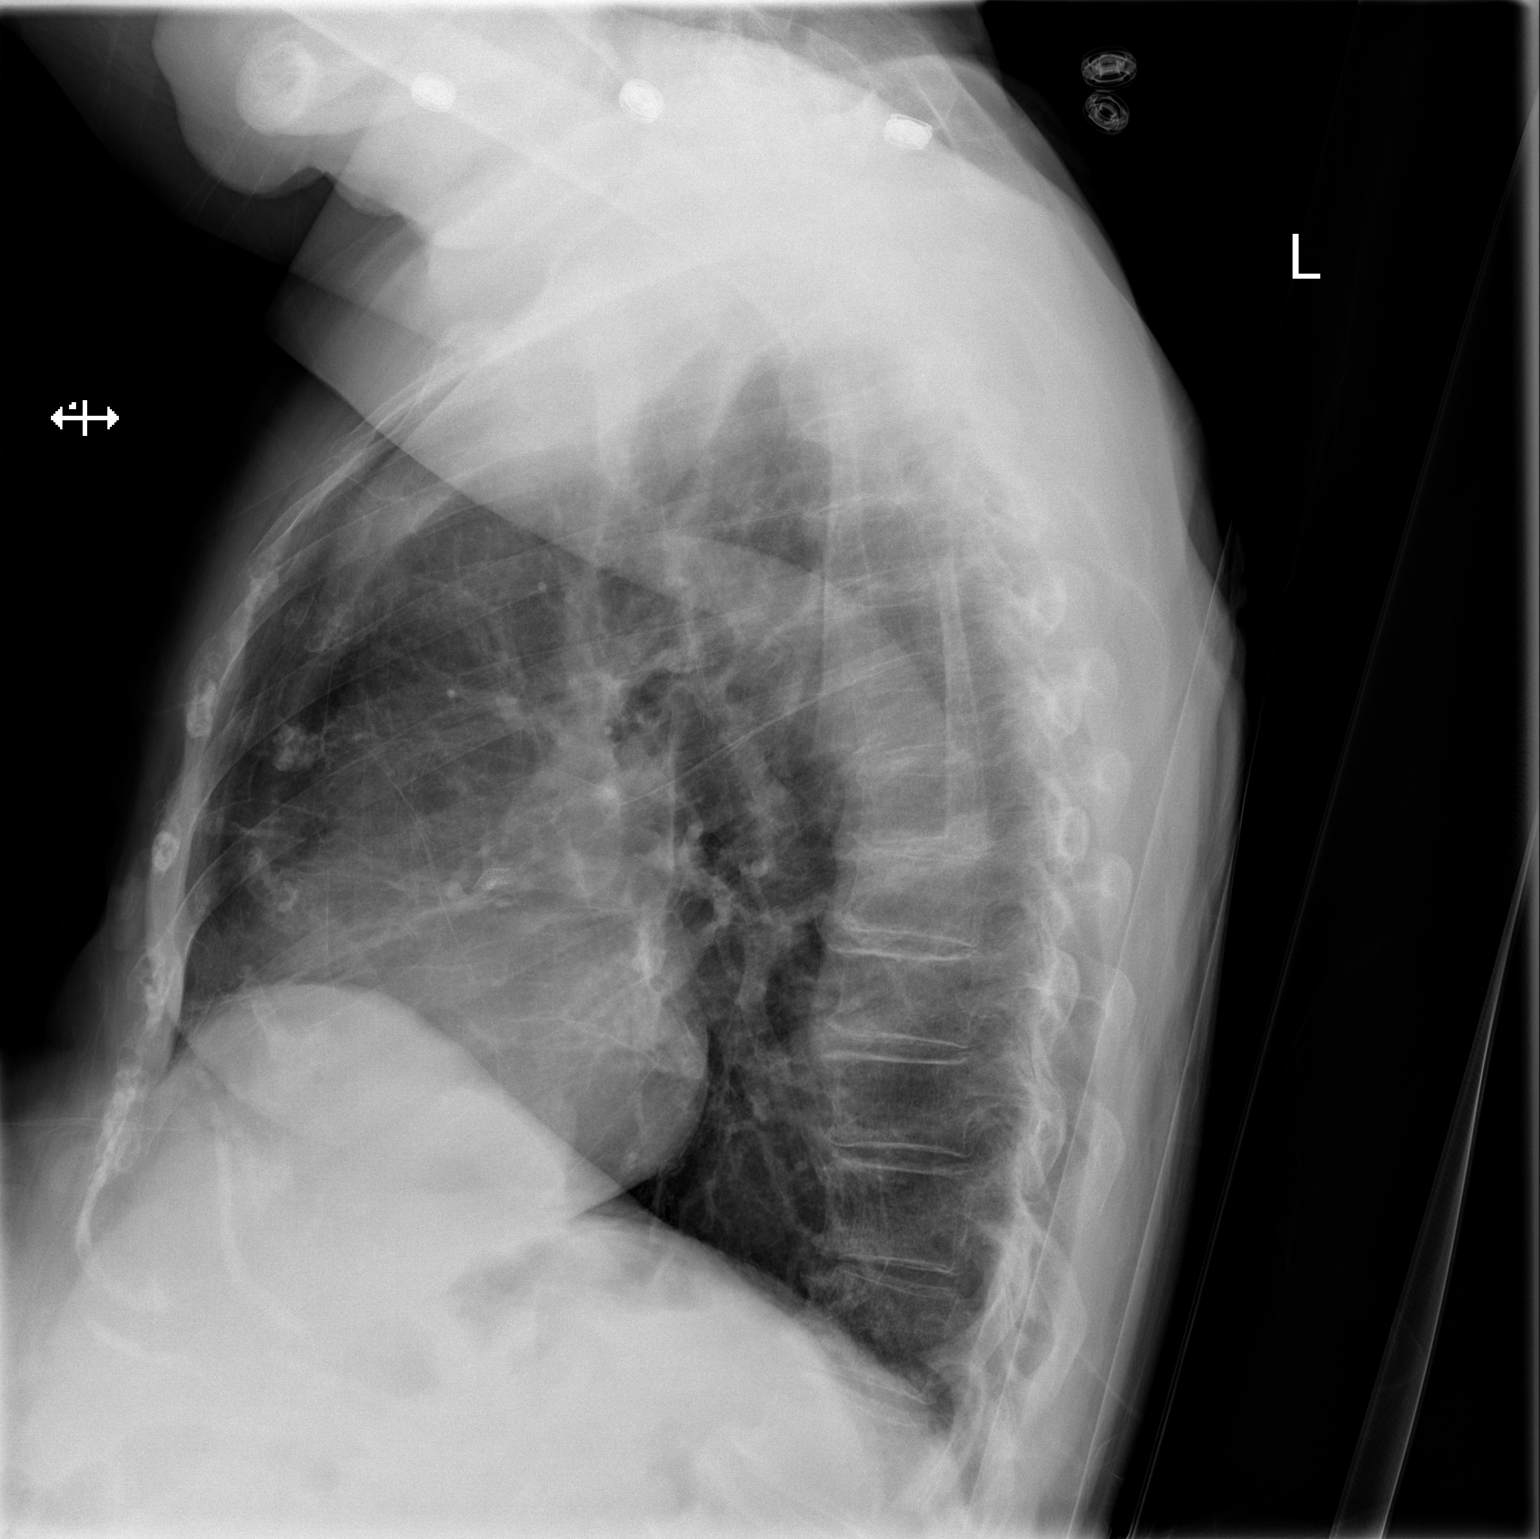

[2 of 2 positions shown; findings below may reference images not displayed]

FINDINGS: Minimal enlargement cardiac silhouette.
Calcified tortuous aorta.
Slight pulmonary vascular congestion.
No acute failure or consolidation.
Minimal chronic subsegmental atelectasis versus scarring left base.
Lungs hyperinflated but otherwise clear.
No pleural effusion or pneumothorax.
Skin folds project over right chest.
Bones appear demineralized.
IMPRESSION: Minimal enlargement of cardiac silhouette.
No acute abnormalities.

## 2013-03-14 IMAGING — CT CT HEAD W/O CM
2 series · 16 of 30 positions shown, 20 images · non-contrast
Comparison: 04/26/2011

CLINICAL DATA: Sudden onset shortness of breath, fatigue, drowsy

CT HEAD WITHOUT CONTRAST
TECHNIQUE: Contiguous axial images were obtained from the base of
the skull through the vertex without contrast.

[Series 2: head w/o · axial · non-contrast · 0.49mm/px · z∈[+161,+291]mm · 13 of 32 slices shown, 17 images]
[im 3/32  brain]
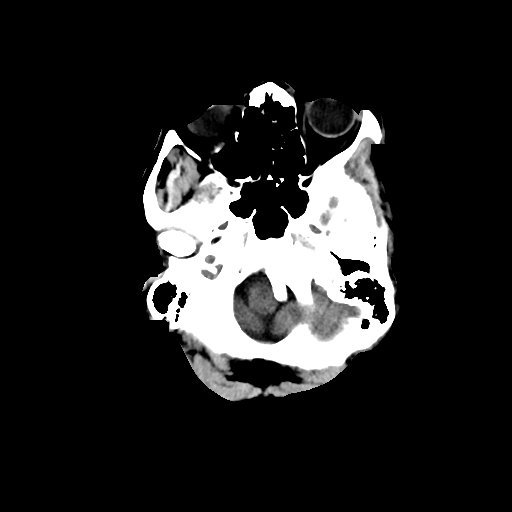
[im 3/32  bone]
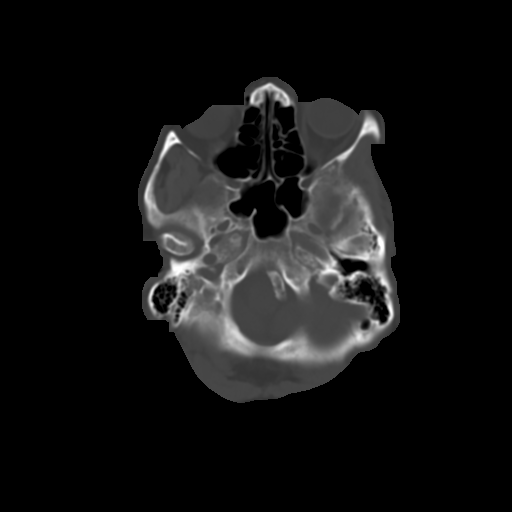
[im 5/32  brain]
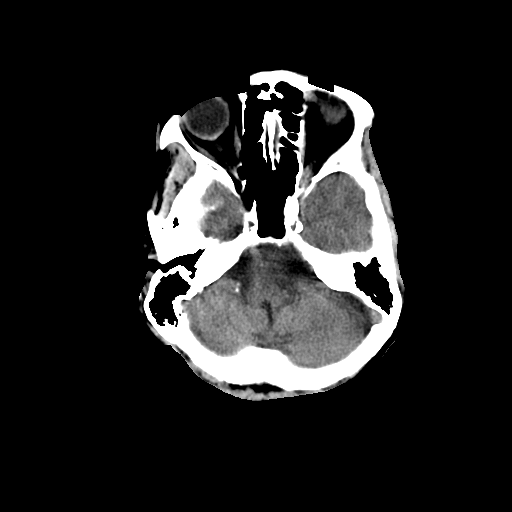
[im 7/32  brain]
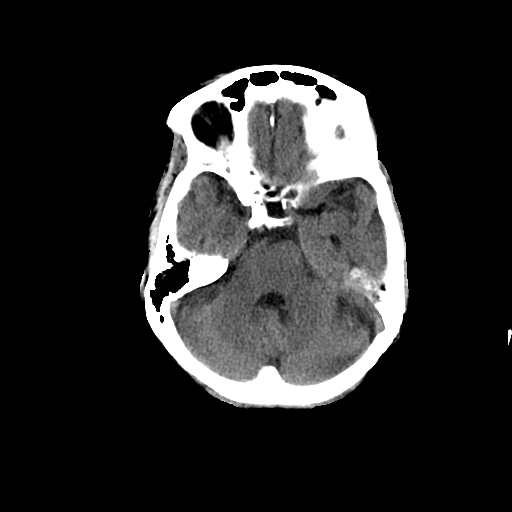
[im 9/32  brain]
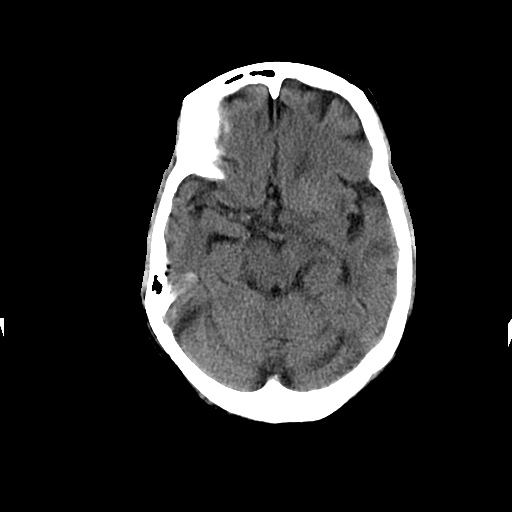
[im 12/32  brain]
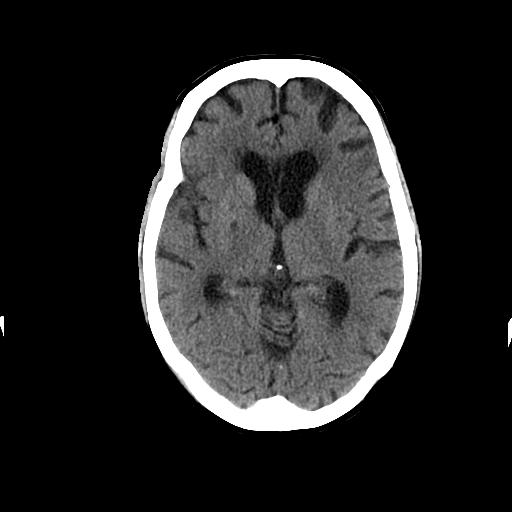
[im 12/32  bone]
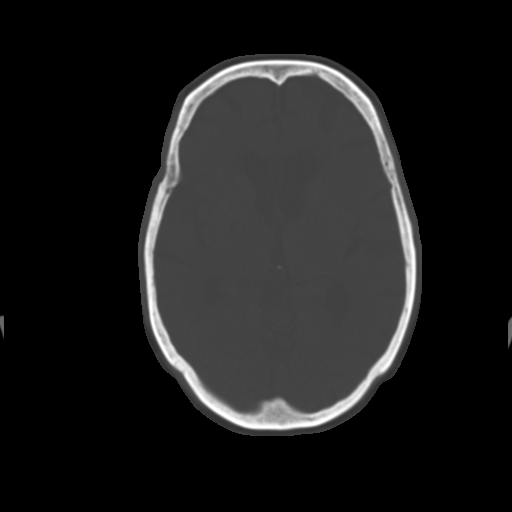
[im 14/32  brain]
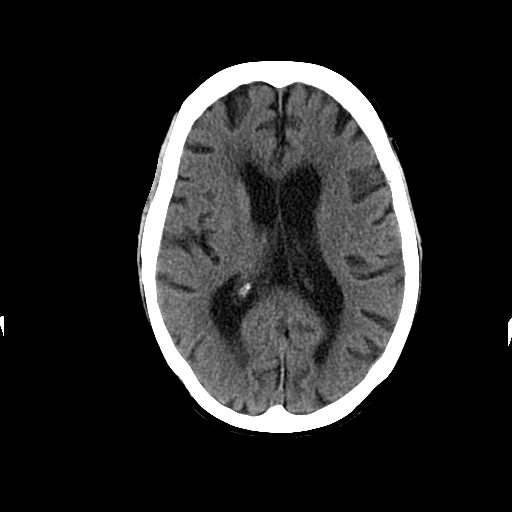
[im 16/32  brain]
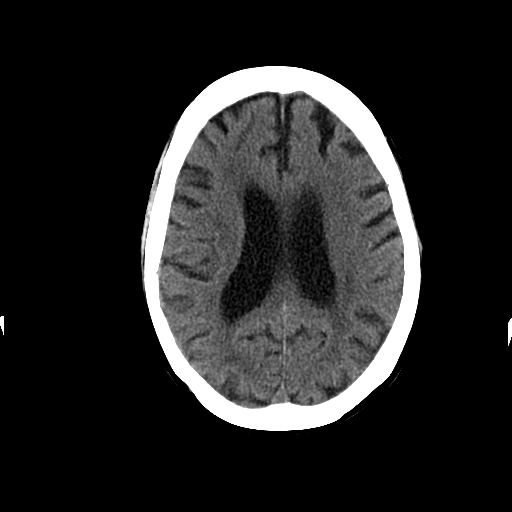
[im 18/32  brain]
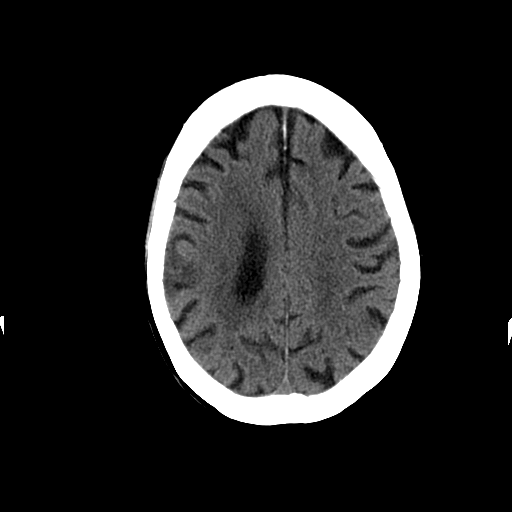
[im 20/32  brain]
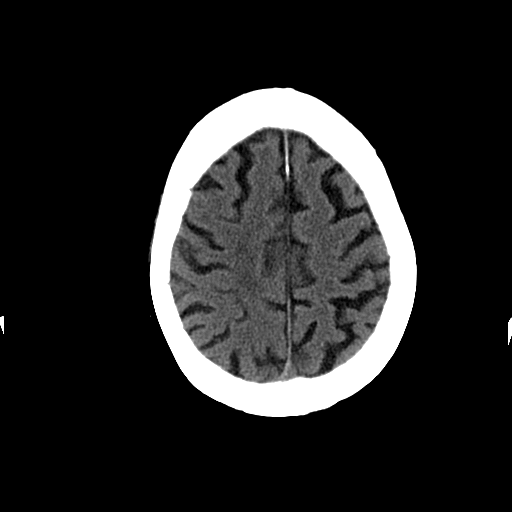
[im 20/32  bone]
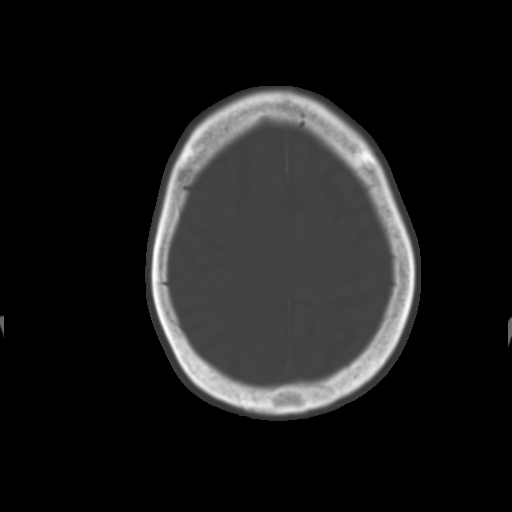
[im 23/32  brain]
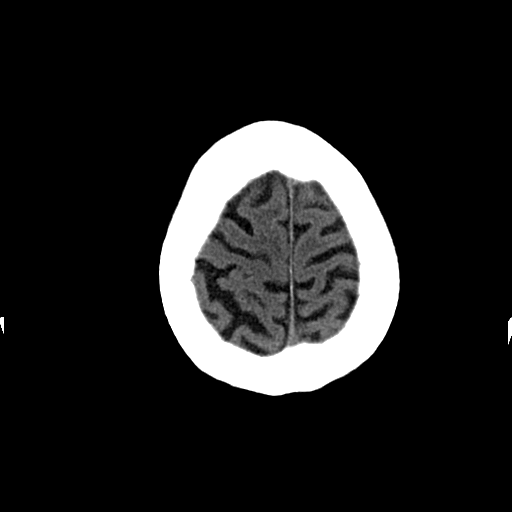
[im 25/32  brain]
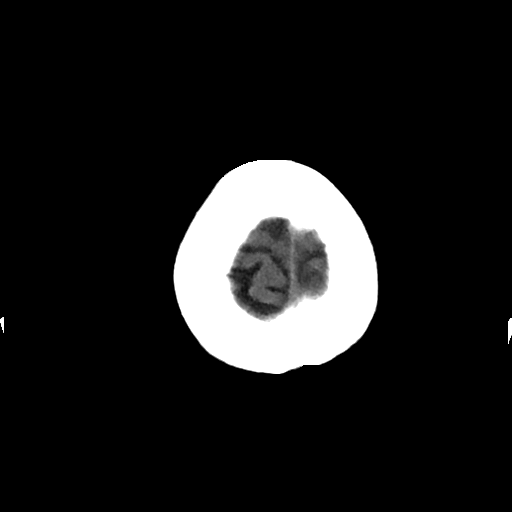
[im 27/32  brain]
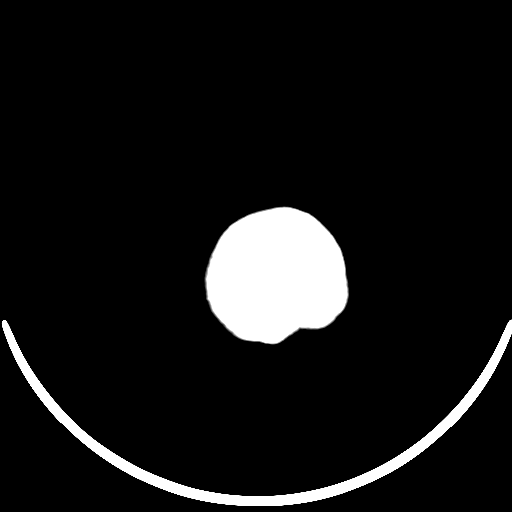
[im 29/32  brain]
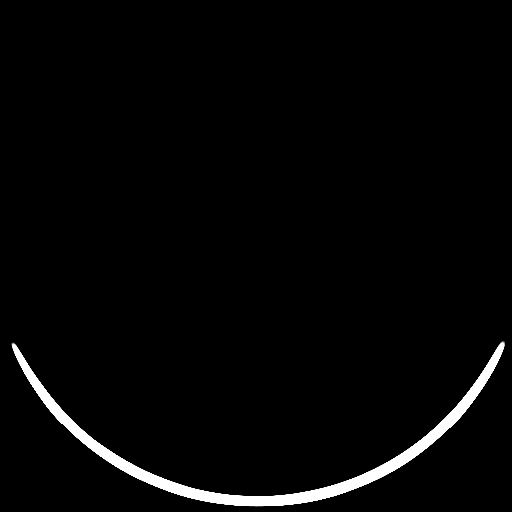
[im 29/32  bone]
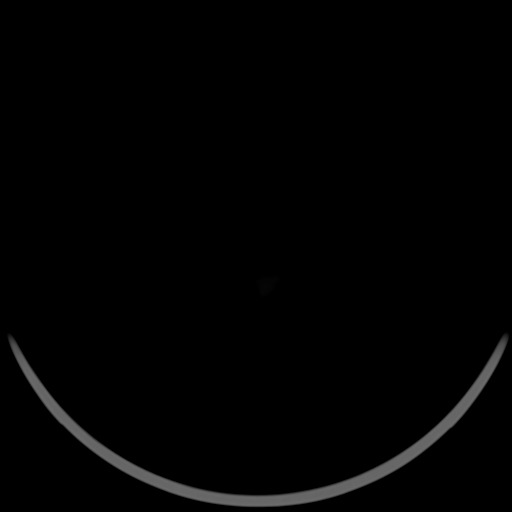

[Series 3: head w/o bone · axial · non-contrast · 0.49mm/px · z∈[+161,+206]mm · 3 of 32 slices shown]
[im 3/32  bone]
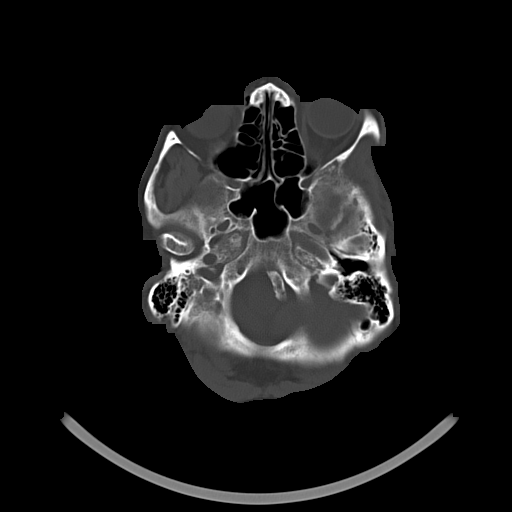
[im 7/32  bone]
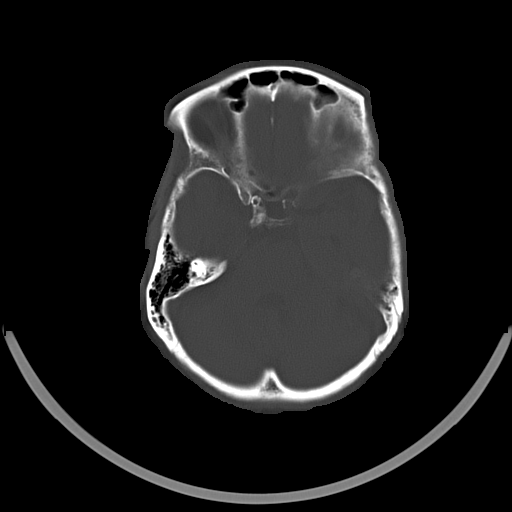
[im 12/32  bone]
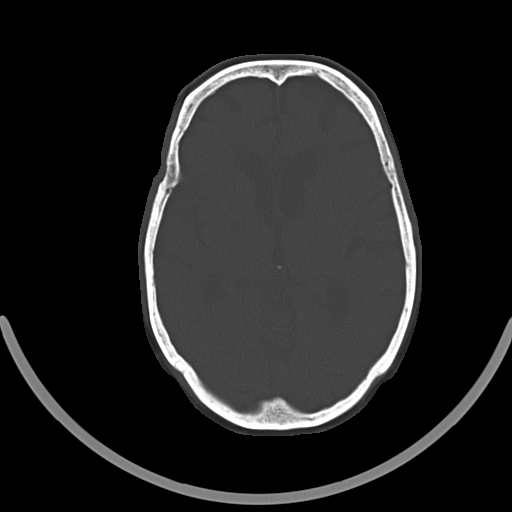

[16 of 30 positions shown; findings below may reference images not displayed]

FINDINGS: Generalized atrophy.
Normal ventricle morphology.
No midline shift or mass effect.
Small vessel chronic ischemic changes of deep cerebral white
matter.
Small old lacunar infarct at right basal ganglia.
No intracranial hemorrhage, mass lesion or evidence of acute
infarction.
No extra-axial fluid collections.
Extensive atherosclerotic calcifications at skull base with minimal
mass effect upon the left lateral aspect of the brainstem by the
left vertebral artery,
Bones and sinuses unremarkable.
IMPRESSION: Atrophy with small vessel chronic ischemic changes deep cerebral
white matter.
Old right basal ganglia lacunar infarct.
No acute intracranial abnormalities.

## 2013-03-17 DIAGNOSIS — M79609 Pain in unspecified limb: Secondary | ICD-10-CM | POA: Diagnosis not present

## 2013-03-17 DIAGNOSIS — B351 Tinea unguium: Secondary | ICD-10-CM | POA: Diagnosis not present

## 2013-03-18 NOTE — Progress Notes (Signed)
PT Treatment - addendum Late note for 02/19/2013 Update G-codes   2013-02-19 1142  PT G-Codes **NOT FOR INPATIENT CLASS**  Mobility: Walking and Moving Around Goal Status [phone]) CI  Mobility: Walking and Moving Around Discharge Status [phone]) CI  PT General Charges  $$ ACUTE PT VISIT 1 Procedure  PT Treatments  $Gait Training 23-37 mins  03/18/2013 Corlis Hove, PT [phone]

## 2013-04-23 ENCOUNTER — Encounter: Payer: Self-pay | Admitting: Family Medicine

## 2013-04-23 ENCOUNTER — Ambulatory Visit (INDEPENDENT_AMBULATORY_CARE_PROVIDER_SITE_OTHER): Payer: Medicare Other | Admitting: Family Medicine

## 2013-04-23 VITALS — BP 177/76 | HR 85 | Temp 98.9°F | Ht 59.0 in | Wt 122.0 lb

## 2013-04-23 DIAGNOSIS — R5381 Other malaise: Secondary | ICD-10-CM

## 2013-04-23 DIAGNOSIS — R531 Weakness: Secondary | ICD-10-CM

## 2013-04-23 DIAGNOSIS — M79609 Pain in unspecified limb: Secondary | ICD-10-CM

## 2013-04-23 DIAGNOSIS — M79604 Pain in right leg: Secondary | ICD-10-CM

## 2013-04-23 DIAGNOSIS — R5383 Other fatigue: Secondary | ICD-10-CM | POA: Diagnosis not present

## 2013-04-23 LAB — BASIC METABOLIC PANEL
CO2: 27 mEq/L (ref 19–32)
Calcium: 10.5 mg/dL (ref 8.4–10.5)
Creat: 1.07 mg/dL (ref 0.50–1.10)
Glucose, Bld: 201 mg/dL — ABNORMAL HIGH (ref 70–99)

## 2013-04-23 LAB — POCT URINALYSIS DIPSTICK
Nitrite, UA: POSITIVE
Protein, UA: 30
Urobilinogen, UA: 1
pH, UA: 7

## 2013-04-23 LAB — POCT UA - MICROSCOPIC ONLY

## 2013-04-23 MED ORDER — CEPHALEXIN 500 MG PO CAPS: 500.0000 mg | ORAL_CAPSULE | Freq: Three times a day (TID) | ORAL | Status: DC

## 2013-04-23 NOTE — Patient Instructions (Addendum)
I will call you if the lab results are abnormal.   Keep staying hydrated. If you have worsening diarrhea, if you see blood, please come back to the clinic. If you have severe abdominal pain, go to the emergency room.

## 2013-04-24 ENCOUNTER — Other Ambulatory Visit: Payer: Self-pay | Admitting: Family Medicine

## 2013-04-24 NOTE — Assessment & Plan Note (Signed)
Unclear etiology of pain. Could be arthritis given description that it's worst with standing. I do not think it's claudication or pseudo claudication since pain doesn't get worst with walking. Will not get back xray or leg xray since it is not likely to change management.  Will treat the acute event of weakness and readdress this when she improves from this episode.

## 2013-04-24 NOTE — Progress Notes (Signed)
Patient ID: [name]    DOB: 16-Jan-1924, 79 y.o.   MRN: [id number] --- Subjective:  [name] is a 79 y.o.female who presents with overall malaise and weakness. - started feeling bad 1 day ago with weakness and diarrhea consisting in watery, non bloody stools, multiple number throughout the day, associated with some abdominal cramping. She has not had any more diarrhea today. She denies any recent antibiotic use. She describes feeling cold. Has a feeling of "head feeling full" but no headache or dizziness or change in vision.  She also describes some feeling of weakness in her legs. The day before the office visit, she buckled over and fell on her knees, witnessed by her daughter. She did not hit her head.  - pain in legs: having pain in the legs and ankles, intermittently, lasts a few minutes, occurs when she first gets up from sitting position. Not worst with walking. Pain is located on the anterior aspect of entire leg bilaterally. No back pain. This has been going on for about 1 month. She has been using her walker on a regular basis for support.   She denies any dysuria, any polyuria. She denies any documented fever. She denies any rash. She denies any cough, rhinorrhea, shortness of breath or chest pain.  No recent change in medications.    ROS: see HPI Past Medical History: reviewed and updated medications and allergies. Social History: Tobacco: chews tobacco  Objective: Filed Vitals:   04/23/13 1615  BP: 177/76  Pulse: 85  Temp: 98.9 F (37.2 C)   Repeat BP: 140/80  Physical Examination:   General appearance - alert, feeble appearing but in no acute distress Mouth - mucous membranes moist, pharynx normal without lesions Chest - clear to auscultation, no wheezes, rales or rhonchi, symmetric air entry Heart - normal rate, regular rhythm, normal S1, S2, 2/6 systolic murmur best heard at right sternal border Abdomen - soft, nontender, nondistended, no masses or  organomegaly Extremities -  Knees: no soft tissue swelling or joint effusion. no tenderness along patella or knee joint line bilaterally, mildly reduced extension and flexion. Some pain with external rotation of the hip.  Knee flexion and extension strength 4+/5 bilaterally Dorsalis pedis pulse present bilaterally No edema

## 2013-04-24 NOTE — Assessment & Plan Note (Addendum)
Differential includes dehydration from bout of diarrhea yesterday vs UTI. Her BP is in the 140's-150's which is reassuring.  Will obtain BMP to rule out any acute renal failure or electrolyte deficiency. Urine shows nitrites and leuks on clean catch. Since patient is symptomatic with generalized weakness and chills (no fever in clinic), will treat with keflex for 7 days. Of course, this may also exacerbate diarrhea. Patient warned to return if worst diarrhea or worst abdominal pain.  Follow up urine culture.

## 2013-04-25 ENCOUNTER — Telehealth: Payer: Self-pay | Admitting: *Deleted

## 2013-04-25 NOTE — Telephone Encounter (Signed)
Requesting refill on hyzaar - not on MAR please advise Wyatt Haste, RN-BSN

## 2013-05-08 ENCOUNTER — Telehealth: Payer: Self-pay | Admitting: Family Medicine

## 2013-05-08 NOTE — Telephone Encounter (Signed)
Called [name] to check on her and to see if she would be available to be evaluated at the geriatric clinic. She states that since the last time she saw me she is feeling a little better but did sustain 2 falls, hitting her head the second time. She had a headache 2 weeks after the fall.  I think she would really benefit from being evaluated at the geriatric clinic and she agreed to this.  She is to call the front desk for scheduling of an appointment next Thursday morning in geri clinic.   Marena Chancy, PGY-2 Family Medicine Resident

## 2013-05-15 ENCOUNTER — Ambulatory Visit (INDEPENDENT_AMBULATORY_CARE_PROVIDER_SITE_OTHER): Payer: Medicare Other | Admitting: Family Medicine

## 2013-05-15 ENCOUNTER — Encounter: Payer: Self-pay | Admitting: Family Medicine

## 2013-05-15 VITALS — BP 97/40 | HR 79 | Temp 98.0°F | Ht 59.0 in | Wt 120.0 lb

## 2013-05-15 DIAGNOSIS — E119 Type 2 diabetes mellitus without complications: Secondary | ICD-10-CM | POA: Diagnosis not present

## 2013-05-15 DIAGNOSIS — D509 Iron deficiency anemia, unspecified: Secondary | ICD-10-CM

## 2013-05-15 DIAGNOSIS — Z9181 History of falling: Secondary | ICD-10-CM

## 2013-05-15 DIAGNOSIS — R5381 Other malaise: Secondary | ICD-10-CM

## 2013-05-15 DIAGNOSIS — Z658 Other specified problems related to psychosocial circumstances: Secondary | ICD-10-CM

## 2013-05-15 DIAGNOSIS — M545 Low back pain, unspecified: Secondary | ICD-10-CM | POA: Diagnosis not present

## 2013-05-15 DIAGNOSIS — Z659 Problem related to unspecified psychosocial circumstances: Secondary | ICD-10-CM

## 2013-05-15 DIAGNOSIS — Z609 Problem related to social environment, unspecified: Secondary | ICD-10-CM

## 2013-05-15 DIAGNOSIS — R5383 Other fatigue: Secondary | ICD-10-CM

## 2013-05-15 DIAGNOSIS — M79609 Pain in unspecified limb: Secondary | ICD-10-CM | POA: Diagnosis not present

## 2013-05-15 DIAGNOSIS — M79651 Pain in right thigh: Secondary | ICD-10-CM

## 2013-05-15 DIAGNOSIS — R413 Other amnesia: Secondary | ICD-10-CM | POA: Diagnosis not present

## 2013-05-15 DIAGNOSIS — R531 Weakness: Secondary | ICD-10-CM

## 2013-05-15 DIAGNOSIS — M948X9 Other specified disorders of cartilage, unspecified sites: Secondary | ICD-10-CM | POA: Diagnosis not present

## 2013-05-15 LAB — CBC
HCT: 33.3 % — ABNORMAL LOW (ref 36.0–46.0)
Hemoglobin: 10 g/dL — ABNORMAL LOW (ref 12.0–15.0)
MCV: 70.9 fL — ABNORMAL LOW (ref 78.0–100.0)
Platelets: 144 10*3/uL — ABNORMAL LOW (ref 150–400)
RBC: 4.7 MIL/uL (ref 3.87–5.11)
WBC: 5 10*3/uL (ref 4.0–10.5)

## 2013-05-15 LAB — RETICULOCYTES: ABS Retic: 37.6 10*3/uL (ref 19.0–186.0)

## 2013-05-15 MED ORDER — MIRTAZAPINE 7.5 MG PO TABS: 7.5000 mg | ORAL_TABLET | Freq: Every day | ORAL | Status: DC

## 2013-05-15 MED ORDER — HYDROCODONE-ACETAMINOPHEN 5-325 MG PO TABS: 1.0000 | ORAL_TABLET | Freq: Four times a day (QID) | ORAL | Status: DC | PRN

## 2013-05-15 MED ORDER — CARVEDILOL 12.5 MG PO TABS: 12.5000 mg | ORAL_TABLET | Freq: Two times a day (BID) | ORAL | Status: DC

## 2013-05-15 NOTE — Assessment & Plan Note (Signed)
Microcytic anemia with recent iron studies showing normal ferritin. Patient states that she has always been anemic.  Will repeat CBC and obtain retic count.

## 2013-05-15 NOTE — Patient Instructions (Signed)
We are going to decrease your carvedilol to 12.5mg  twice a day. I am sending the medicine to the pharmacy.   The other medicine I would like to try is called mirtazapine and it helps with appetite, sleep and mood.  Please follow up with me in 2 weeks to see how things are going.

## 2013-05-15 NOTE — Assessment & Plan Note (Signed)
MMSE: 23 on 05/15/13, no baseline score. Appears more functional than score of 23. Monitor in next 6 months.

## 2013-05-15 NOTE — Assessment & Plan Note (Signed)
Likely trochanteric bursitis. Has had injections in the past, which have helped. May want to schedule an injection at a future followup.

## 2013-05-15 NOTE — Progress Notes (Signed)
Patient ID: DALANEY NEEDLE    DOB: 05-31-1924, 79 y.o.   MRN: [id number] --- Subjective:  [name] is a 79 y.o.female who presents at geriatric clinic for evaluation of recent falls and overall elderly adult assessment.   - falls: most recent episode was 1 month ago. She doesn't remember some of the details, but does recall sitting folding laundry. She then thinks she got up and fell to the ground, hitting her head and loosing consciousness. She then regained consciousness, crawled to the front door of the house and called for help. Her neighbor found her and helped her to a chair. She felt dizzy for about half an hour later. She denies any headache s/p fall. Prior to the fall, she denies any chest pain, any heart palpitations, any shortness of breath, any change in vision, any dizziness, nausea, or diaphoresis.  The fall prior to that was 3 weeks prior when she hit the floor while walking. This was due to her right leg giving out on her. She was walking with her 4 point cane at the time. She did not hit her head or loose consciousness at the time.  She denies any recent numbness and tingling in her feet. She states she used to have this but not recently.   - right hip pain: pain is located on the lateral side of her right thigh. She had pain prior to falling. It's a burning type pain.Marland Kitchen Not worst with laying on that side. Not worst with walking. Sometimes bad when standing up for the first time.  If she walks for a long time, she feels her legs getting weak. She had a knee replacement in her right knee which she has tolerated well.  No urine or bowel incontinence.  ROS: see HPI Past Medical History: reviewed and updated medications and allergies. Social History: Tobacco: chews tobacco Lives at home with her son. Cooks her noon and dinner meals. Son cooks breakfast.   Objective: Filed Vitals:   05/15/13 1150  BP: 97/40  Pulse: 79  Temp:    Orthostatics:  BP: 131/46 laying 111/47 sitting 97/40  standing Pulse:67 laying 70 witting 79 standing  Physical Examination:   General appearance - alert, well appearing, and in no distress Ears - bilateral TM's and external ear canals normal Chest - clear to auscultation, no wheezes, rales or rhonchi, symmetric air entry Heart - normal rate, regular rhythm, normal S1, S2, 2/6 systolic murmur best heard at right sternal border Extremities - no peripheral edema  MSK - tenderness to palpation along right greater trochanter, no SI or ASIS joint tenderness. No tenderness to palpation along lumbar spine or paraspinal muscles.  Reduced external and internal rotation of the hip, but no pain with movement.  Minimally reduced knee flexion, full knee extension. 90 degree hip flexion on left, 70 deg on right Negative FABER  Walking: able to stand from sitting Wide gait, using 4 point walker in right hand. Steady with ambulation. No obvious limp.   PHQ 9: 12 MMSE: 23 Fall risk: 9

## 2013-05-15 NOTE — Assessment & Plan Note (Addendum)
Patient is at increased risk of fall given recent episodes and score of 9 on risk score. Differential includes orthostasis vs weakness from spinal stenosis or from knee osteoarthritis vs B12 deficiency.  She has some complaints of weakness in her legs after walking for a long period of time, which could be related to pseudo claudication from spinal stenosis, but this appears less likely.  Orthostatic vital signs are positive and could be a reason for her recent episodes.  Will decrease coreg to 12.5 from 25mg  bid. There is a question of what dose of her losartan and hctz she is currently taking, so I have asked her to bring her medication bottles with her at her next visit.  Also, will check vitamin D since last time it was checked was in 2010.  Will also check B12.  Continued encouragement to walk with walker and cane. Technically, 4 point cane should be used in left hand since she has more difficult with right side. However, cane is fitted for her right hand. Continue to monitor.

## 2013-05-15 NOTE — Assessment & Plan Note (Signed)
Lives with son who is not always in the house with her. She is visited by her grandchildren on a regular basis. The daughter she identifies as her source of support currently doesn't have a car.  Her feeling of not being surrounded by her family like she used to is likely contributing to her feelings of depression.

## 2013-05-15 NOTE — Assessment & Plan Note (Signed)
Fatigue with difficulty sleeping and some signs of depression with phq9 of 12. Denies any SI/HI but does state that she thinks she would be better off dead. Will try low dose remeron to help with sleep, appetite and mood.  Follow up in 2 weeks.

## 2013-05-16 LAB — VITAMIN D 25 HYDROXY (VIT D DEFICIENCY, FRACTURES): Vit D, 25-Hydroxy: 35 ng/mL (ref 30–89)

## 2013-05-20 ENCOUNTER — Encounter: Payer: Self-pay | Admitting: Family Medicine

## 2013-05-21 ENCOUNTER — Other Ambulatory Visit: Payer: Self-pay | Admitting: Family Medicine

## 2013-05-30 ENCOUNTER — Encounter: Payer: Self-pay | Admitting: Family Medicine

## 2013-05-30 ENCOUNTER — Ambulatory Visit (INDEPENDENT_AMBULATORY_CARE_PROVIDER_SITE_OTHER): Payer: Medicare Other | Admitting: Family Medicine

## 2013-05-30 VITALS — BP 148/71 | HR 83 | Ht 59.0 in | Wt 123.9 lb

## 2013-05-30 DIAGNOSIS — Z853 Personal history of malignant neoplasm of breast: Secondary | ICD-10-CM

## 2013-05-30 DIAGNOSIS — R5381 Other malaise: Secondary | ICD-10-CM | POA: Diagnosis not present

## 2013-05-30 DIAGNOSIS — R5383 Other fatigue: Secondary | ICD-10-CM

## 2013-05-30 DIAGNOSIS — M545 Low back pain, unspecified: Secondary | ICD-10-CM | POA: Diagnosis not present

## 2013-05-30 DIAGNOSIS — Z9181 History of falling: Secondary | ICD-10-CM

## 2013-05-30 MED ORDER — HYDROCODONE-ACETAMINOPHEN 5-325 MG PO TABS: 1.0000 | ORAL_TABLET | Freq: Four times a day (QID) | ORAL | Status: DC | PRN

## 2013-05-30 MED ORDER — LOSARTAN POTASSIUM 50 MG PO TABS: 50.0000 mg | ORAL_TABLET | Freq: Every day | ORAL | Status: DC

## 2013-05-30 MED ORDER — TAMOXIFEN CITRATE 20 MG PO TABS: 20.0000 mg | ORAL_TABLET | Freq: Every day | ORAL | Status: DC

## 2013-05-30 MED ORDER — MIRTAZAPINE 15 MG PO TABS: 15.0000 mg | ORAL_TABLET | Freq: Every day | ORAL | Status: DC

## 2013-05-30 NOTE — Patient Instructions (Addendum)
For te blood pressure, continue taking the coreg 12.5mg  twice a day. Start the losartan 50mg  daily. If you start feeling lightheaded when you stand up, let us know.   For the appetite, finish the bottle of mirtazapine 7.5mg . Take two tablets. The new prescription for 15mg  is at the pharmacy.

## 2013-06-01 NOTE — Assessment & Plan Note (Signed)
Fatigue and difficulty sleeping significantly improved with remeron 7.5mg . Still having low appetite and therefore will increase remeron to 15mg  daily.  phq9 significantly improved from 12 to 4.

## 2013-06-01 NOTE — Assessment & Plan Note (Signed)
No falls since last visit. Patient remains orthostatic at this time with standing SBP: 140's and standing 190's. Patient had been taking 32.5mg  bid of coreg instead of the prescribed 12.5mg  bid. Will restart her on losartan 50mg  daily and monitor BP and BMP. Follow up in 3-4 weeks.

## 2013-06-01 NOTE — Assessment & Plan Note (Signed)
Refilled vicodin 5/325mg  bid/prn 60 tablets for chronic pain along incision of sites of mastectomy.

## 2013-06-01 NOTE — Progress Notes (Signed)
Patient ID: CRESTA RIDEN    DOB: Mar 19, 1924, 79 y.o.   MRN: [id number] --- Subjective:  [name] is a 79 y.o.female with h/o chronic systolic heart failure, DM2, h/o breast cancer s/p mastectomy in 2011, who presents for follow up on hypertension and possible depression.   - Hypertension and orthostatic hypotension:  Patient was evaluated a couple of weeks ago for fall assessment and found to be orthostatic. Coreg dose was reduced to 12.5mg  bid from 25mg  bid. Upon review of meds, it turns out, patient was taking both the old dose and the new one. She was not however taking the losartan/hcyz listed on her med list.  She denies any more fall episodes. She denies any dizziness from sitting to standing. She denies any chest pain (other than her chronic pain along scars from mastectomy). Denies any shortness of breath or lower extremity edema.   - depression: phq9 at last visit on 05/15/12 to be 12, notable for difficulty falling asleep and poor appetite. Patient was started on remeron 7.5mg  which she states she has been taking and has helped her fall asleep and stay asleep.  Appetite is still poor.  phq9 tody: 4.   - chronic pain along scar line from mastectomy: continues having sharp shooting pain along breastline, significantly improved with vicodin. She takes it once in morning and oce before going to bed.   ROS: see HPI Past Medical History: reviewed and updated medications and allergies. Social History: Tobacco: chews tobacco  Objective: Filed Vitals:   05/30/13 1142  BP: 148/71  Pulse: 83   Orthostatics:  Laying: 196/76, HR: 79 Sitting: 180/76, HR: 79 Standing: 148/71 HR: 83   Physical Examination:   General appearance - alert, well appearing, and in no distress Heart - normal rate, regular rhythm, normal S1, S2, no murmurs, rubs, clicks or gallops MSK - tenderness along lateral chest wall bilaterally Extremities - no pedal edema

## 2013-06-10 ENCOUNTER — Other Ambulatory Visit (HOSPITAL_BASED_OUTPATIENT_CLINIC_OR_DEPARTMENT_OTHER): Payer: Medicare Other | Admitting: Lab

## 2013-06-10 ENCOUNTER — Ambulatory Visit (HOSPITAL_BASED_OUTPATIENT_CLINIC_OR_DEPARTMENT_OTHER): Payer: Medicare Other | Admitting: Oncology

## 2013-06-10 ENCOUNTER — Encounter: Payer: Self-pay | Admitting: Oncology

## 2013-06-10 ENCOUNTER — Telehealth: Payer: Self-pay | Admitting: Hematology and Oncology

## 2013-06-10 VITALS — BP 150/89 | HR 84 | Temp 97.4°F | Resp 20 | Ht 59.0 in | Wt 117.5 lb

## 2013-06-10 DIAGNOSIS — Z853 Personal history of malignant neoplasm of breast: Secondary | ICD-10-CM | POA: Diagnosis not present

## 2013-06-10 DIAGNOSIS — C50919 Malignant neoplasm of unspecified site of unspecified female breast: Secondary | ICD-10-CM

## 2013-06-10 LAB — COMPREHENSIVE METABOLIC PANEL (CC13)
AST: 11 U/L (ref 5–34)
Albumin: 3.4 g/dL — ABNORMAL LOW (ref 3.5–5.0)
Alkaline Phosphatase: 36 U/L — ABNORMAL LOW (ref 40–150)
Glucose: 193 mg/dl — ABNORMAL HIGH (ref 70–140)
Potassium: 3.8 mEq/L (ref 3.5–5.1)
Sodium: 142 mEq/L (ref 136–145)
Total Protein: 7 g/dL (ref 6.4–8.3)

## 2013-06-10 LAB — CBC WITH DIFFERENTIAL/PLATELET
BASO%: 0.2 % (ref 0.0–2.0)
Basophils Absolute: 0 10*3/uL (ref 0.0–0.1)
EOS%: 1.7 % (ref 0.0–7.0)
HGB: 9.6 g/dL — ABNORMAL LOW (ref 11.6–15.9)
MCH: 22.5 pg — ABNORMAL LOW (ref 25.1–34.0)
MONO#: 0.2 10*3/uL (ref 0.1–0.9)
RDW: 15.8 % — ABNORMAL HIGH (ref 11.2–14.5)
WBC: 4.8 10*3/uL (ref 3.9–10.3)
lymph#: 1.9 10*3/uL (ref 0.9–3.3)

## 2013-06-10 NOTE — Telephone Encounter (Signed)
Gave pt appt for lab and MD on january 2014

## 2013-06-10 NOTE — Progress Notes (Signed)
[facility]  Telephone:([phone]) [phone] [phone]   OFFICE PROGRESS NOTE   Cc:  Marena Chancy, MD  DIAGNOSIS:   Status post bilateral simple mastectomies with bilateral axillary lymph node dissection for breast cancer in 2001. Right breast revealed a T1c N0 and left breast reveals a T1c N1 with 1/1 positive lymph nodes. ER was 100% and PR 27%. HER-2/neu showed no evidence of amplification for FISH.   CURRENT THERAPY: adjuvant Tamoxifen started in 11/2010.   INTERVAL HISTORY: [name] 79 y.o. female returns for regular follow up by herself. She has rare episodes of bilateral mastectomy scar shooting pain without palpable skin rash/nodule/discharge.  She has been taking Tamoxifen without problem.  She denied vaginal bleeding, hot flash, extremity swelling and pain.  She has moderate, diffuse osteoarhthric pain and takes Lortab in order to be functional.  She is still independent of all activities of daily living.  She still able to grocery shop, cook and clean taking pain meds.    The rest of the 14-point review of system was negative.   Past Medical History  Diagnosis Date  . Breast cancer 10/2010    Invasive Ductal Carcinoma, s/p bilateral mastectomy, now on Tamoxifen. Followed by Dr Dalene Carrow.   . Diabetes mellitus   . Hypertension   . Hyperlipidemia   . Anemia     Iron def anemia  . CAD (coronary artery disease)   . CHF (congestive heart failure)     Systolic   . History of colon cancer   . Neuropathy   . Allergy   . GERD (gastroesophageal reflux disease)   . Hiatal hernia   . Diverticulosis   . Asthma   . Arthritis   . H/O: GI bleed   . Breast cancer 2011    s/p Bilateral masectomy  . Shortness of breath   . Pneumonia 03/2012    Past Surgical History  Procedure Laterality Date  . Breast masectomy  10/2010    Bilateral masectomy by Dr Ezzard Standing s/p invasive ductal carcinoma.  . Cardiac  catherization  2007    Severe 3-vessel disease.  EF  20-25%.  . Esophagogastroduodenoscopy  2001    Esophageal Tear  . Total knee arthroplasty  2001  . Esophagogastroduodenoscopy  10/27/2012    Procedure: ESOPHAGOGASTRODUODENOSCOPY (EGD);  Surgeon: Hilarie Fredrickson, MD;  Location: [facility] ENDOSCOPY;  Service: Endoscopy;  Laterality: N/A;  pat  . Breast surgery      Current Outpatient Prescriptions  Medication Sig Dispense Refill  . albuterol (PROVENTIL HFA;VENTOLIN HFA) 108 (90 BASE) MCG/ACT inhaler Inhale 2 puffs into the lungs every 6 (six) hours as needed. For shortness of breath  1 Inhaler  6  . aspirin (ASPIRIN CHILDRENS) 81 MG chewable tablet Chew 81 mg by mouth daily.        . carvedilol (COREG) 12.5 MG tablet Take 1 tablet (12.5 mg total) by mouth 2 (two) times daily with a meal.  60 tablet  3  . colchicine 0.6 MG tablet Take 1 tablet (0.6 mg total) by mouth once. Take 2 tablets on 1 day, if still having pain in an hour, take 1 more tablet on day 1. After that take 2 tablets daily for 2 weeks then 1 tablet daily. See [place] back in 1 week if not improving or if you develop fevers.  60 tablet  0  . fluticasone (FLONASE) 50 MCG/ACT nasal spray Place 2 sprays into the nose daily.  16 g  6  .  HYDROcodone-acetaminophen (NORCO/VICODIN) 5-325 MG per tablet Take 1 tablet by mouth every 6 (six) hours as needed for pain. Do not fill before 04/01/13  60 tablet  1  . losartan (COZAAR) 50 MG tablet Take 1 tablet (50 mg total) by mouth daily.  30 tablet  4  . metFORMIN (GLUCOPHAGE) 500 MG tablet Take 1 tablet (500 mg total) by mouth 2 (two) times daily with a meal.  60 tablet  5  . mirtazapine (REMERON) 15 MG tablet Take 1 tablet (15 mg total) by mouth at bedtime.  30 tablet  3  . PATADAY 0.2 % SOLN Place 1 drop into both eyes daily as needed. For itchy eyes      . ranitidine (ZANTAC) 150 MG capsule Take 1 capsule (150 mg total) by mouth 2 (two) times daily.  180 capsule  6  . rosuvastatin (CRESTOR) 20 MG tablet Take 1 tablet (20 mg total) by mouth daily.  30 tablet   5  . tamoxifen (NOLVADEX) 20 MG tablet Take 1 tablet (20 mg total) by mouth daily.  30 tablet  2  . [DISCONTINUED] cetirizine (ZYRTEC) 5 MG tablet Take 1 tablet (5 mg total) by mouth daily. Take at night time as it can cause some drowsiness  30 tablet  4   No current facility-administered medications for this visit.    ALLERGIES:  is allergic to peanuts and lisinopril.  REVIEW OF SYSTEMS:  The rest of the 14-point review of system was negative.   Filed Vitals:   06/10/13 1346  BP: 150/89  Pulse: 84  Temp: 97.4 F (36.3 C)  Resp: 20   Wt Readings from Last 3 Encounters:  06/10/13 117 lb 8 oz (53.298 kg)  05/30/13 123 lb 14.4 oz (56.201 kg)  05/15/13 120 lb (54.432 kg)   ECOG Performance status: 1  PHYSICAL EXAMINATION:   General:  well-nourished woman in no acute distress.  Eyes:  no scleral icterus.  ENT:  There were no oropharyngeal lesions.  Neck was without thyromegaly.  Lymphatics:  Negative cervical, supraclavicular or axillary adenopathy.  Respiratory: lungs were clear bilaterally without wheezing or crackles.  Cardiovascular:  Regular rate and rhythm, S1/S2, without murmur, rub or gallop.  There was no pedal edema.  GI:  abdomen was soft, flat, nontender, nondistended, without organomegaly.  Muscoloskeletal:  no spinal tenderness of palpation of vertebral spine.  Skin exam was without echymosis, petichae.  Neuro exam was nonfocal.  Patient was able to get on and off exam table without assistance.  Gait was normal.  Patient was alerted and oriented.  Attention was good.   Language was appropriate.  Mood was normal without depression.  Speech was not pressured.  Thought content was not tangential.   Bilateral breast exam showed bilateral mastectomy scars without nodules, erythema, purulent discharge.     LABORATORY/RADIOLOGY DATA:  Lab Results  Component Value Date   WBC 4.8 06/10/2013   HGB 9.6* 06/10/2013   HCT 30.3* 06/10/2013   PLT 159 06/10/2013   GLUCOSE 193* 06/10/2013    CHOL 96 07/10/2012   TRIG 92 07/10/2012   HDL 31* 07/10/2012   LDLDIRECT 99 10/21/2009   LDLCALC 47 07/10/2012   ALKPHOS 36* 06/10/2013   ALT <6 Repeated and Verified 06/10/2013   AST 11 06/10/2013   NA 142 06/10/2013   K 3.8 06/10/2013   CL 106 04/23/2013   CREATININE 1.0 06/10/2013   BUN 12.8 06/10/2013   CO2 23 06/10/2013   HGBA1C 7.7 05/15/2013  ASSESSMENT AND PLAN:    1.  History of bilateral breast cancer: - We discussed the pros and cons of switching to an AI after 2 years of tamoxifen according to positive date from ATAC trial.  However, she has osteoarthritic pain and osteoporosis and were not interested in switching to an AI.  She prefers to keep taking Tamoxifen due to lack of any side effect. After 5 years, we will discuss whether she would like to continue it for 10 years total which has better outcome than 5 years.   2.  Osteoarthritis:  On pain medication per PCP.  3.  Anemia of chronic disease or from slight renal insufficiency:  Stable, no active bleeding, no need for transfusion.   4.  Follow up:  In about 6 months.     The length of time of the face-to-face encounter was 15  minutes. More than 50% of time was spent counseling and coordination of care.

## 2013-06-13 ENCOUNTER — Ambulatory Visit: Payer: Medicare Other | Admitting: Family Medicine

## 2013-06-16 ENCOUNTER — Ambulatory Visit (INDEPENDENT_AMBULATORY_CARE_PROVIDER_SITE_OTHER): Payer: Medicare Other | Admitting: Family Medicine

## 2013-06-16 ENCOUNTER — Encounter: Payer: Self-pay | Admitting: Family Medicine

## 2013-06-16 VITALS — BP 186/75 | HR 94 | Temp 98.4°F | Ht 59.0 in | Wt 117.0 lb

## 2013-06-16 DIAGNOSIS — B351 Tinea unguium: Secondary | ICD-10-CM | POA: Diagnosis not present

## 2013-06-16 DIAGNOSIS — R42 Dizziness and giddiness: Secondary | ICD-10-CM | POA: Insufficient documentation

## 2013-06-16 DIAGNOSIS — M79609 Pain in unspecified limb: Secondary | ICD-10-CM | POA: Diagnosis not present

## 2013-06-16 NOTE — Assessment & Plan Note (Signed)
Patient discussed in detail with Dr. Leveda Anna who independently examined patient. She is tachycardic but otherwise, no concerning symptoms or physical exam findings. It is possible that she just overdid it at her visit to the cancer center last week and she needs to take time to recover. Also, she is missing her Losartan (which was confirmed that she picked up from the pharmacy on July 19.) She could be slightly more hypertensive without this medication which is exaggerating her orthostasis, however she denies problems with position changes. For now, since there are no red flags, we have encouraged patient to stay well hydrated and rest. She should follow up with her PCP next week for re-evaluation or sooner if anything changes. She agrees with this plan.

## 2013-06-16 NOTE — Patient Instructions (Addendum)
I am sorry you are not feeling well today.  Keep taking all of your medications except the sleep pill, if you do not want to take that one it is ok.  Drink plenty of fluids and rest up.  If you feel worse, please come in. Otherwise, we will see you next Wednesday for a follow up.  Amber M. Hairford, M.D.

## 2013-06-16 NOTE — Progress Notes (Signed)
Patient ID: [name], female   DOB: 09-03-1924, 79 y.o.   MRN: [id number]  [facility] [name] M. Demontae Antunes, MD Phone: [phone]   Subjective: HPI: Patient is a 78 y.o. female walked into clinic today for same day appointment with report of being "sick" with lightheadedness.  Went to cancer center last wed. Thursday she had swelling in her feet and pain all over. Friday she realized some of her medications were missing. She is not sure if someone took them or if she lost them. On review of medications, she is only missing her Losartan. (Of note,keeps her pain medication on her at all times and is not missing any of those doses.) She reports feeling lightheaded. No reported falls lately. She reports a subjective fever and chills. She is feeling more cold than usual. She denies sick contacts.   She states that it all started after walking a long way to her appointment at the cancer center. Appetite is same as usual. She endorses increased constipation. No problems urinating. No bleeding or bruising anywhere. She endorses 6lb weight loss in last month. No chest pain. She endorses vision change in the last 3 weeks but has not been to see eye doctor. She does endorse palpitations last week but not since then.   Labs done last week at cancer center were wnl, except chronic anemia.  Last office visit with Dr. Gwenlyn Saran she had positive orthostatics but she denies increased lightheadedness with position changes. She does however state that the change in her sleep medicine to Remeron makes her feel different and she would like to stop taking it.  History Reviewed: Everyday oral tobacco user  ROS: Please see HPI above.  Objective: Office vital signs reviewed. BP 186/75  Pulse 94  Temp(Src) 98.4 F (36.9 C) (Oral)  Ht 4\' 11"  (1.499 m)  Wt 117 lb (53.071 kg)  BMI 23.62 kg/m2  Physical Examination:  General: Awake, alert. Answers questions appropriately. Non-ill  appearing HEENT: Atraumatic, normocephalic. MMM. Pupils small but reactive bilaterally. Extraocular movements intact Pulm: CTAB, no wheezes Cardio: tachycardic, 1/6 systolic murmur. Good distal pulses Abdomen: soft, nontender, nondistended Extremities: Trace edema bilaterally Neuro: Grossly intact  Assessment: 79 y.o. female same day appointment  Plan: See Problem List and After Visit Summary

## 2013-06-16 NOTE — Progress Notes (Signed)
Pt reports that she is "missing" several meds: colchicine, Norco, Cozaar - does not know what happened to them - also reports that Remeron makes her "sick". Ankles & feet are swelling.  Pt uses [facility] on [address].

## 2013-06-25 ENCOUNTER — Encounter: Payer: Self-pay | Admitting: Family Medicine

## 2013-06-25 ENCOUNTER — Ambulatory Visit (INDEPENDENT_AMBULATORY_CARE_PROVIDER_SITE_OTHER): Payer: Medicare Other | Admitting: Family Medicine

## 2013-06-25 VITALS — BP 190/80 | HR 89 | Ht 59.0 in | Wt 125.0 lb

## 2013-06-25 DIAGNOSIS — I10 Essential (primary) hypertension: Secondary | ICD-10-CM | POA: Diagnosis not present

## 2013-06-25 MED ORDER — LOSARTAN POTASSIUM 50 MG PO TABS: 50.0000 mg | ORAL_TABLET | Freq: Every day | ORAL | Status: DC

## 2013-06-25 NOTE — Patient Instructions (Signed)
Your blood pressure was high today so I would like for you to continue taking the coreg twice a day as well as start the losartan medicine.  Please follow up with me in 1 week to see how your blood pressure is doing. We will get labwork at that time too.  If you start having chest pain, shortness of breath, headache or weakness or slurred speech, please go to the emergency room as soon as possible.

## 2013-06-25 NOTE — Assessment & Plan Note (Addendum)
BP significantly elevated. Not orthostatic. With her history of light headedness and orthostasis though, will not aggressively treat in office with clonidine. However, strongly suggested that she take coreg when she gets home. I suspect elevation in BP may be from missed doses.  Also rewrote Rx for losartan 50mg  which she had not understood to fill. Gave her hard copy so that she could bring Rx in person to pharmacy.  Follow up in 1 week for repeat BP and BMP with losartan started back.

## 2013-06-25 NOTE — Progress Notes (Signed)
Patient ID: [name]    DOB: [birth year], 79 y.o.   MRN: [id number] --- Subjective:  [name] is a 79 y.o.female who presents for follow up on blood pressure.  She states that she doesn't know which bp med she is missing. She is only taking coreg 12.5mg  twice a day and admits to not taking it this morning.   She denies any chest pain, any shortness of breath, any weakness or headache. She was seen 2 weeks ago for lightheadedness which has resolved.  She has some mild swelling in lower extremities.   ROS: see HPI Past Medical History: reviewed and updated medications and allergies. Social History: Tobacco: chews tobacco  Objective: Filed Vitals:   06/25/13 1505  BP: 208/113  Pulse: 87   Orthostatics:  Standing: 190/80, Sitting: 200/82  Physical Examination:   General appearance - alert, well appearing, and in no distress Chest - clear to auscultation, no wheezes, rales or rhonchi, symmetric air entry Heart - normal rate, regular rhythm, normal S1, S2, no murmurs, 3rd heart sound heard Extremities - faint dorsalis pedis bilaterally, trace pitting pretibial edema.

## 2013-07-02 ENCOUNTER — Telehealth: Payer: Self-pay | Admitting: Family Medicine

## 2013-07-02 DIAGNOSIS — Z9181 History of falling: Secondary | ICD-10-CM

## 2013-07-02 NOTE — Telephone Encounter (Signed)
Pt is requesting a bedside commode. Please send it to [facility] [phone] . JW

## 2013-07-02 NOTE — Telephone Encounter (Signed)
Will forward to Dr Losq 

## 2013-07-03 ENCOUNTER — Ambulatory Visit: Payer: Medicare Other | Admitting: Family Medicine

## 2013-07-06 ENCOUNTER — Emergency Department (HOSPITAL_COMMUNITY): Payer: Medicare Other

## 2013-07-06 ENCOUNTER — Observation Stay (HOSPITAL_COMMUNITY)
Admission: EM | Admit: 2013-07-06 | Discharge: 2013-07-07 | Disposition: A | Discharge status: Home Health | Payer: Medicare Other | Attending: Family Medicine | Admitting: Family Medicine

## 2013-07-06 ENCOUNTER — Encounter (HOSPITAL_COMMUNITY): Payer: Self-pay | Admitting: Radiology

## 2013-07-06 DIAGNOSIS — I10 Essential (primary) hypertension: Secondary | ICD-10-CM | POA: Insufficient documentation

## 2013-07-06 DIAGNOSIS — I5022 Chronic systolic (congestive) heart failure: Secondary | ICD-10-CM | POA: Insufficient documentation

## 2013-07-06 DIAGNOSIS — R29898 Other symptoms and signs involving the musculoskeletal system: Secondary | ICD-10-CM | POA: Diagnosis not present

## 2013-07-06 DIAGNOSIS — Z79899 Other long term (current) drug therapy: Secondary | ICD-10-CM | POA: Insufficient documentation

## 2013-07-06 DIAGNOSIS — R531 Weakness: Secondary | ICD-10-CM

## 2013-07-06 DIAGNOSIS — R209 Unspecified disturbances of skin sensation: Secondary | ICD-10-CM | POA: Diagnosis not present

## 2013-07-06 DIAGNOSIS — E785 Hyperlipidemia, unspecified: Secondary | ICD-10-CM | POA: Diagnosis not present

## 2013-07-06 DIAGNOSIS — R5381 Other malaise: Secondary | ICD-10-CM | POA: Diagnosis not present

## 2013-07-06 DIAGNOSIS — E1142 Type 2 diabetes mellitus with diabetic polyneuropathy: Secondary | ICD-10-CM | POA: Diagnosis not present

## 2013-07-06 DIAGNOSIS — Z853 Personal history of malignant neoplasm of breast: Secondary | ICD-10-CM | POA: Diagnosis not present

## 2013-07-06 DIAGNOSIS — I509 Heart failure, unspecified: Secondary | ICD-10-CM | POA: Diagnosis not present

## 2013-07-06 DIAGNOSIS — F0390 Unspecified dementia without behavioral disturbance: Secondary | ICD-10-CM | POA: Diagnosis not present

## 2013-07-06 DIAGNOSIS — G589 Mononeuropathy, unspecified: Secondary | ICD-10-CM | POA: Diagnosis not present

## 2013-07-06 DIAGNOSIS — R404 Transient alteration of awareness: Secondary | ICD-10-CM | POA: Diagnosis not present

## 2013-07-06 DIAGNOSIS — D509 Iron deficiency anemia, unspecified: Secondary | ICD-10-CM | POA: Insufficient documentation

## 2013-07-06 DIAGNOSIS — E1149 Type 2 diabetes mellitus with other diabetic neurological complication: Secondary | ICD-10-CM | POA: Insufficient documentation

## 2013-07-06 DIAGNOSIS — R55 Syncope and collapse: Secondary | ICD-10-CM | POA: Diagnosis not present

## 2013-07-06 DIAGNOSIS — R079 Chest pain, unspecified: Secondary | ICD-10-CM | POA: Diagnosis not present

## 2013-07-06 DIAGNOSIS — G629 Polyneuropathy, unspecified: Secondary | ICD-10-CM

## 2013-07-06 LAB — URINE MICROSCOPIC-ADD ON

## 2013-07-06 LAB — CBC WITH DIFFERENTIAL/PLATELET
Basophils Absolute: 0 10*3/uL (ref 0.0–0.1)
Basophils Relative: 0 % (ref 0–1)
Eosinophils Absolute: 0.1 10*3/uL (ref 0.0–0.7)
Eosinophils Relative: 3 % (ref 0–5)
HCT: 30 % — ABNORMAL LOW (ref 36.0–46.0)
MCHC: 32.7 g/dL (ref 30.0–36.0)
MCV: 70.1 fL — ABNORMAL LOW (ref 78.0–100.0)
Monocytes Absolute: 0.4 10*3/uL (ref 0.1–1.0)
Neutro Abs: 1.6 10*3/uL — ABNORMAL LOW (ref 1.7–7.7)
RDW: 15.8 % — ABNORMAL HIGH (ref 11.5–15.5)

## 2013-07-06 LAB — POCT I-STAT TROPONIN I: Troponin i, poc: 0 ng/mL (ref 0.00–0.08)

## 2013-07-06 LAB — COMPREHENSIVE METABOLIC PANEL
AST: 13 U/L (ref 0–37)
Albumin: 3.4 g/dL — ABNORMAL LOW (ref 3.5–5.2)
Calcium: 10.4 mg/dL (ref 8.4–10.5)
Creatinine, Ser: 0.91 mg/dL (ref 0.50–1.10)
Total Protein: 7.2 g/dL (ref 6.0–8.3)

## 2013-07-06 LAB — URINALYSIS, ROUTINE W REFLEX MICROSCOPIC
Glucose, UA: NEGATIVE mg/dL
Hgb urine dipstick: NEGATIVE
Specific Gravity, Urine: 1.017 (ref 1.005–1.030)

## 2013-07-06 LAB — CG4 I-STAT (LACTIC ACID): Lactic Acid, Venous: 1.6 mmol/L (ref 0.5–2.2)

## 2013-07-06 MED ORDER — SODIUM CHLORIDE 0.9 % IV SOLN
INTRAVENOUS | Status: DC
  Administered 2013-07-06: 10 mL/h via INTRAVENOUS

## 2013-07-06 MED ORDER — FAMOTIDINE 20 MG PO TABS
20.0000 mg | ORAL_TABLET | Freq: Two times a day (BID) | ORAL | Status: DC
  Administered 2013-07-06 – 2013-07-07 (×2): 20 mg via ORAL
  Filled 2013-07-06 (×3): qty 1

## 2013-07-06 MED ORDER — ASPIRIN 325 MG PO TABS
325.0000 mg | ORAL_TABLET | Freq: Once | ORAL | Status: AC
  Administered 2013-07-06: 325 mg via ORAL
  Filled 2013-07-06: qty 1

## 2013-07-06 MED ORDER — ONDANSETRON HCL 4 MG/2ML IJ SOLN: 4.0000 mg | Freq: Four times a day (QID) | INTRAMUSCULAR | Status: DC | PRN

## 2013-07-06 MED ORDER — ONDANSETRON HCL 4 MG PO TABS: 4.0000 mg | ORAL_TABLET | Freq: Four times a day (QID) | ORAL | Status: DC | PRN

## 2013-07-06 MED ORDER — OLOPATADINE HCL 0.1 % OP SOLN
1.0000 [drp] | Freq: Two times a day (BID) | OPHTHALMIC | Status: DC
  Filled 2013-07-06: qty 5

## 2013-07-06 MED ORDER — LOSARTAN POTASSIUM 50 MG PO TABS
50.0000 mg | ORAL_TABLET | Freq: Every day | ORAL | Status: DC
  Administered 2013-07-07: 50 mg via ORAL
  Filled 2013-07-06: qty 1

## 2013-07-06 MED ORDER — ACETAMINOPHEN 650 MG RE SUPP: 650.0000 mg | Freq: Four times a day (QID) | RECTAL | Status: DC | PRN

## 2013-07-06 MED ORDER — INSULIN ASPART 100 UNIT/ML ~~LOC~~ SOLN: 0.0000 [IU] | Freq: Every day | SUBCUTANEOUS | Status: DC

## 2013-07-06 MED ORDER — TAMOXIFEN CITRATE 20 MG PO TABS: 20.0000 mg | ORAL_TABLET | Freq: Every day | ORAL | Status: DC

## 2013-07-06 MED ORDER — TAMOXIFEN CITRATE 10 MG PO TABS
20.0000 mg | ORAL_TABLET | Freq: Every day | ORAL | Status: DC
  Administered 2013-07-07: 20 mg via ORAL
  Filled 2013-07-06: qty 2

## 2013-07-06 MED ORDER — ACETAMINOPHEN 325 MG PO TABS: 650.0000 mg | ORAL_TABLET | Freq: Four times a day (QID) | ORAL | Status: DC | PRN

## 2013-07-06 MED ORDER — CARVEDILOL 12.5 MG PO TABS
12.5000 mg | ORAL_TABLET | Freq: Two times a day (BID) | ORAL | Status: DC
  Administered 2013-07-07 (×2): 12.5 mg via ORAL
  Filled 2013-07-06 (×2): qty 1

## 2013-07-06 MED ORDER — OLOPATADINE HCL 0.1 % OP SOLN: 1.0000 [drp] | Freq: Every day | OPHTHALMIC | Status: DC | PRN

## 2013-07-06 MED ORDER — ASPIRIN 81 MG PO CHEW
81.0000 mg | CHEWABLE_TABLET | Freq: Every day | ORAL | Status: DC
  Administered 2013-07-07: 81 mg via ORAL
  Filled 2013-07-06: qty 1

## 2013-07-06 MED ORDER — HEPARIN SODIUM (PORCINE) 5000 UNIT/ML IJ SOLN
5000.0000 [IU] | Freq: Three times a day (TID) | INTRAMUSCULAR | Status: DC
  Administered 2013-07-06 – 2013-07-07 (×3): 5000 [IU] via SUBCUTANEOUS
  Filled 2013-07-06 (×4): qty 1

## 2013-07-06 MED ORDER — SODIUM CHLORIDE 0.9 % IJ SOLN: 3.0000 mL | Freq: Two times a day (BID) | INTRAMUSCULAR | Status: DC

## 2013-07-06 MED ORDER — ATORVASTATIN CALCIUM 40 MG PO TABS
40.0000 mg | ORAL_TABLET | Freq: Every day | ORAL | Status: DC
  Administered 2013-07-07: 40 mg via ORAL
  Filled 2013-07-06: qty 1

## 2013-07-06 MED ORDER — INSULIN ASPART 100 UNIT/ML ~~LOC~~ SOLN
0.0000 [IU] | Freq: Three times a day (TID) | SUBCUTANEOUS | Status: DC
  Administered 2013-07-07: 1 [IU] via SUBCUTANEOUS

## 2013-07-06 MED ORDER — ALBUTEROL SULFATE HFA 108 (90 BASE) MCG/ACT IN AERS: 2.0000 | INHALATION_SPRAY | Freq: Four times a day (QID) | RESPIRATORY_TRACT | Status: DC | PRN

## 2013-07-06 NOTE — ED Notes (Signed)
Pt knows that urine is needed 

## 2013-07-06 NOTE — ED Notes (Signed)
Pt's daughters at bedside.  Pt lives with 2 of her sons.  Per daughters, son called EMS after he saw pt reaching and he caught her.  Pt did not fall and no LOC.  Pt states she feels "tired".  Pt is unable to put any other words or description to it.

## 2013-07-06 NOTE — ED Notes (Addendum)
Pt presents with near syncopal episode. Pt reports weakness in her "chest and legs" pt denies CP and dizziness. Per family pt has had near syncopal episodes X 1 month which pt denies

## 2013-07-06 NOTE — ED Notes (Signed)
IV start attempted.  IV team paged.

## 2013-07-06 NOTE — ED Provider Notes (Signed)
CSN: [id number]     Arrival date & time 07/06/13  1600 History     First MD Initiated Contact with Patient 07/06/13 1637     Chief Complaint  Patient presents with  . Near Syncope   (Consider location/radiation/quality/duration/timing/severity/associated sxs/prior Treatment) HPI 79 year old female with history of diabetes and coronary disease weakness in both legs for the last few days, generalized weakness worse than baseline for the last few months, today unable to walk due to new-onset numbness to feet and lower legs below knees as well as worsening weakness both legs with no trauma no syncope no fever no lightheadedness no palpitations no cough but she did have a few episodes a few minutes each of chest pressure and shortness of breath today with no abdominal pain no nausea no diaphoresis no vomiting no bloody stools no abdominal pain no lateralizing weakness or numbness just bilateral numbness below her knees including both feet and lower legs today with gradually worsening weakness to both legs for the last few days worsening today to the point where she now cannot walk according to the patient and family, at baseline she was able to use a walker but cannot use it today, she is no weakness or numbness to her arms. She is no chest pain or shortness of breath now. No known history of diabetic neuropathy. Past Medical History  Diagnosis Date  . Breast cancer 10/2010    Invasive Ductal Carcinoma, s/p bilateral mastectomy, now on Tamoxifen. Followed by Dr Dalene Carrow.   . Diabetes mellitus   . Hypertension   . Hyperlipidemia   . Anemia     Iron def anemia  . CAD (coronary artery disease)   . CHF (congestive heart failure)     Systolic   . History of colon cancer   . Neuropathy   . Allergy   . GERD (gastroesophageal reflux disease)   . Hiatal hernia   . Diverticulosis   . Asthma   . Arthritis   . H/O: GI bleed   . Breast cancer 2011    s/p Bilateral masectomy  . Shortness of breath    . Pneumonia 03/2012   Past Surgical History  Procedure Laterality Date  . Breast masectomy  10/2010    Bilateral masectomy by Dr Ezzard Standing s/p invasive ductal carcinoma.  . Cardiac  catherization  2007    Severe 3-vessel disease.  EF 20-25%.  . Esophagogastroduodenoscopy  2001    Esophageal Tear  . Total knee arthroplasty  2001  . Esophagogastroduodenoscopy  10/27/2012    Procedure: ESOPHAGOGASTRODUODENOSCOPY (EGD);  Surgeon: Hilarie Fredrickson, MD;  Location: [facility] ENDOSCOPY;  Service: Endoscopy;  Laterality: N/A;  pat  . Breast surgery     Family History  Problem Relation Age of Onset  . Sickle cell anemia Other   . Heart disease Mother   . Heart disease Father    History  Substance Use Topics  . Smoking status: Never Smoker   . Smokeless tobacco: Current User    Types: Snuff  . Alcohol Use: No   OB History   Grav Para Term Preterm Abortions TAB SAB Ect Mult Living                 Review of Systems 10 Systems reviewed and are negative for acute change except as noted in the HPI. Allergies  Peanuts and Lisinopril  Home Medications   Current Outpatient Rx  Name  Route  Sig  Dispense  Refill  . albuterol (PROVENTIL HFA;VENTOLIN HFA)  108 (90 BASE) MCG/ACT inhaler   Inhalation   Inhale 2 puffs into the lungs every 6 (six) hours as needed. For shortness of breath   1 Inhaler   6   . aspirin (ASPIRIN CHILDRENS) 81 MG chewable tablet   Oral   Chew 81 mg by mouth daily.           . carvedilol (COREG) 12.5 MG tablet   Oral   Take 1 tablet (12.5 mg total) by mouth 2 (two) times daily with a meal.   60 tablet   3   . colchicine 0.6 MG tablet   Oral   Take 1 tablet (0.6 mg total) by mouth once. Take 2 tablets on 1 day, if still having pain in an hour, take 1 more tablet on day 1. After that take 2 tablets daily for 2 weeks then 1 tablet daily. See [place] back in 1 week if not improving or if you develop fevers.   60 tablet   0   . losartan (COZAAR) 50 MG tablet   Oral   Take  1 tablet (50 mg total) by mouth daily.   30 tablet   4   . metFORMIN (GLUCOPHAGE) 500 MG tablet   Oral   Take 1 tablet (500 mg total) by mouth 2 (two) times daily with a meal.   60 tablet   5   . mirtazapine (REMERON) 15 MG tablet   Oral   Take 1 tablet (15 mg total) by mouth at bedtime.   30 tablet   3   . PATADAY 0.2 % SOLN   Both Eyes   Place 1 drop into both eyes daily as needed. For itchy eyes         . ranitidine (ZANTAC) 150 MG capsule   Oral   Take 1 capsule (150 mg total) by mouth 2 (two) times daily.   180 capsule   6   . rosuvastatin (CRESTOR) 20 MG tablet   Oral   Take 1 tablet (20 mg total) by mouth daily.   30 tablet   5   . tamoxifen (NOLVADEX) 20 MG tablet   Oral   Take 1 tablet (20 mg total) by mouth daily.   30 tablet   2   . amLODipine (NORVASC) 10 MG tablet   Oral   Take 1 tablet (10 mg total) by mouth daily.   30 tablet   3   . polyethylene glycol (MIRALAX / GLYCOLAX) packet   Oral   Take 17 g by mouth 2 (two) times daily as needed.   14 each   0    BP 125/64  Pulse 74  Temp(Src) 98.3 F (36.8 C) (Oral)  Resp 16  Ht 5\' 1"  (1.549 m)  Wt 122 lb 5.7 oz (55.5 kg)  BMI 23.13 kg/m2  SpO2 100% Physical Exam  Nursing note and vitals reviewed. Constitutional:  Awake, alert, nontoxic appearance with baseline speech for patient.  HENT:  Head: Atraumatic.  Mouth/Throat: No oropharyngeal exudate.  Eyes: EOM are normal. Pupils are equal, round, and reactive to light. Right eye exhibits no discharge. Left eye exhibits no discharge.  Neck: Neck supple.  Cardiovascular: Normal rate and regular rhythm.   No murmur heard. Pulmonary/Chest: Effort normal and breath sounds normal. No stridor. No respiratory distress. She has no wheezes. She has no rales. She exhibits no tenderness.  Abdominal: Soft. Bowel sounds are normal. She exhibits no mass. There is no tenderness. There is no rebound.  Musculoskeletal: She exhibits tenderness. She  exhibits no edema.  Baseline ROM, moves extremities with no obvious new focal weakness. Back and cervical spine nontender. Both feet tender to palpation. Both feet have capillary refill less than 2 seconds and toes with dorsalis pedis pulses intact, decreased light touch to both feet and both lower legs below knees, normal light touch to both upper legs.  Lymphadenopathy:    She has no cervical adenopathy.  Neurological: She is alert.  Awake, alert, cooperative and aware of situation; motor strength 5/5 arms and 3/5 legs bilaterally; sensation normal to light touch bilaterally except decreased light touch to both lower legs and feet bilaterally; peripheral visual fields full to confrontation; no facial asymmetry; tongue midline; major cranial nerves appear intact; no pronator drift, normal finger to nose bilaterally, he states too weak to walk in the ED  Skin: No rash noted.  Psychiatric:  Anxious    ED Course   Procedures (including critical care time) ECG: Sinus rhythm, ventricular rate 68, normal axis, left ventricular hypertrophy, no significant change noted compared with March 2014 Patient / Family / Caregiver informed of clinical course, understand medical decision-making process, and agree with plan. Labs Reviewed  CBC WITH DIFFERENTIAL - Abnormal; Notable for the following:    WBC 3.8 (*)    Hemoglobin 9.8 (*)    HCT 30.0 (*)    MCV 70.1 (*)    MCH 22.9 (*)    RDW 15.8 (*)    Platelets 135 (*)    Neutrophils Relative % 42 (*)    Neutro Abs 1.6 (*)    All other components within normal limits  COMPREHENSIVE METABOLIC PANEL - Abnormal; Notable for the following:    Glucose, Bld 141 (*)    Albumin 3.4 (*)    Alkaline Phosphatase 34 (*)    Total Bilirubin 0.2 (*)    GFR calc non Af Amer 55 (*)    GFR calc Af Amer 64 (*)    All other components within normal limits  URINALYSIS, ROUTINE W REFLEX MICROSCOPIC - Abnormal; Notable for the following:    APPearance CLOUDY (*)     Protein, ur 30 (*)    Leukocytes, UA SMALL (*)    All other components within normal limits  URINE MICROSCOPIC-ADD ON - Abnormal; Notable for the following:    Squamous Epithelial / LPF FEW (*)    Bacteria, UA MANY (*)    Casts HYALINE CASTS (*)    All other components within normal limits  BASIC METABOLIC PANEL - Abnormal; Notable for the following:    Glucose, Bld 105 (*)    GFR calc non Af Amer 56 (*)    GFR calc Af Amer 65 (*)    All other components within normal limits  CBC - Abnormal; Notable for the following:    WBC 3.5 (*)    Hemoglobin 9.2 (*)    HCT 27.8 (*)    MCV 70.0 (*)    MCH 23.2 (*)    Platelets 113 (*)    All other components within normal limits  GLUCOSE, CAPILLARY - Abnormal; Notable for the following:    Glucose-Capillary 120 (*)    All other components within normal limits  GLUCOSE, CAPILLARY - Abnormal; Notable for the following:    Glucose-Capillary 143 (*)    All other components within normal limits  URINE CULTURE  VITAMIN B12  TSH  GLUCOSE, CAPILLARY  GLUCOSE, CAPILLARY  CG4 I-STAT (LACTIC ACID)  POCT I-STAT TROPONIN I  No results found. 1. Weakness   2. Neuropathy   3. Chronic systolic heart failure   4. Essential hypertension, benign     MDM  The patient appears reasonably stabilized for admission considering the current resources, flow, and capabilities available in the ED at this time, and I doubt any other [facility] requiring further screening and/or treatment in the ED prior to admission.  Hurman Horn, MD 07/08/13 2033

## 2013-07-06 NOTE — ED Notes (Signed)
IV team will be approximately an hour.  Pt stable and IV ordered to be saline locked.  Will wait for IV team.

## 2013-07-06 NOTE — ED Notes (Signed)
Pt unable to stand to do the standing part of orthostatic VS

## 2013-07-06 NOTE — H&P (Signed)
Family Medicine Teaching [facility] Admission History and Physical Service Pager: [phone]  [name] Medical record number: [id number] Date of birth: [birth year] Age: 79 y.o. Gender: female  Primary Care Provider: Marena Chancy, MD Consultants: none Code Status: DNR  Chief Complaint: Leg weakness/numbness  Assessment and Plan: [name] is a 79 y.o. year old female presenting with 3 day onset of bilateral weakness and numbness of the legs below the knees. PMH is significant for DM, HTN, HLD, CAD, CHF, breast cancer, anemia, neuropathy, dementia.  # Bilateral weakness: weakness at baseline and uses walker but increasing past 3 days in lower legs. Refuses to walk today, although strength in legs on exam grossly normal for age except for hip flexion. Differential includes psychogenic cause related to stress, worsening neuropathy, worsening dementia and refusal to participate, neurological causes such as guillan barre, medication adverse effect (potentially vicodin). Additionally potentially related to pain due to neuropathy or arthritis. Lower suspicion would be for mass lesion of the spine (less likely given near normal strength, sensation, and reflexes on exam) or CVA (less likely given bilateral symptoms and near normal neuro exam).  - Admitting physician Dr. Lum Babe - Telemetry-continuous cardiopulmonary monitoring for potential respiratory compromise - Neuro checks q4 hrs for progression of weakness - [ ]  f/u CBC, BMET, TSH, B12 - [ ]  Consider Neuro consult in AM if not improved - [ ]  PT eval - consider addition of treatment for arthritis or neuropathy if these are deemed to be causing this issue  # Confusion / Dementia: MMSE 23 on 05/15/13. Took vicodin earlier today, may have decompensated due to this. A&Ox1 (City/state location) near end of exam in ED, patient appeared very anxious after telling her the plan to admit her. May be related to psychological  stressors given home environment and recent death of a cousin. - Neuro checks as above - Hold vicodin, mirtazpine - will continue to monitor  # CHF: most recent echo 3/14 EF 65-70%, grade 1 diastolic dysfunction - will follow volume status  # Anemia, stable: chronic - Hgb 9.8, MCV 70.1 - Likely iron deficient - consider iron supplement  # HTN: BPs in 120-170s/40s-90s - Continue home carvedilol, losartan  # Diabetes mellitus: last A1c 7.2 (12/2012) - Sensitive SSI and HS coverage  # Hyperlipidemia: - Continue Crestor  FEN/GI: carb modified Prophylaxis: heparin  Disposition: admit to med-surg  History of Present Illness: [name] is a 79 y.o. year old female presenting with weakness and numbness of both legs bilaterally. PMHx significant for DM, HTN, HLD, CAD, CHF (EF 20-25%), breast cancer, anemia, neuropathy. Patient's accuracy of history is questionable given intermittent cooperation with interview and poor performance on memory questions. For the past 3 days she has had increased numbness, weakness, stiffness of both legs below the knees. She says it started in her ankles. Yesterday she symptoms worsened, but today her strength was better this morning, but legs still numb until she suddenly had an episode of weakness and had difficulty walking. At baseline she uses a walker and does have weakness over the past several months. Per patient's daughter report from brother: around the same time she had increasing confusion, but no syncope. She did take 1 vicodin earlier today per patient report though upon questioning about what type of pain medication it was she could not tell the exact name. Unknown when her confusion started to improve but the daughter said she was at baseline when she brought her to the ED.  She denies any pain in the legs, no incontinence of urine/stool, no history of recent illness, no headaches, no vision changes, does endorse some hearing changes over the past few  days but can't describe this, she has decreased appetite and hasn't been eating/drinking well in the last 2 days.   Daughter noted that patients cousin passed away recently. Stated that patient and cousin were not close. Additionally noted some stressors at home in living with her 2 sons and that both of them were drinking earlier on the day of admission.  Review Of Systems: Per HPI with the following additions: none. Otherwise 12 point review of systems was performed and was unremarkable.  Patient Active Problem List   Diagnosis Date Noted  . Light headed 06/16/2013  . History of fall 05/15/2013  . Right thigh pain 05/15/2013  . Memory deficit 05/15/2013  . Bilateral leg pain 03/07/2013  . Trigger thumb of right hand 03/07/2013  . Lower extremity edema 02/19/2013  . Weakness generalized 01/22/2013  . Stricture and stenosis of esophagus 10/27/2012  . Food impaction of esophagus 10/27/2012  . Gout attack 09/13/2012  . Poor social situation 09/13/2012  . Low back pain 08/07/2012  . Musculoskeletal chest pain 07/10/2012  . Thoracic back pain 05/24/2012  . Weight loss 04/03/2012  . Insomnia 09/25/2011  . Urinary frequency 06/02/2011  . Tobacco abuse 06/02/2011  . History of breast cancer in female, bilateral.  Mastectomies 10/24/2010. 04/26/2011  . Hiatal hernia 03/22/2011  . Colon polyp 03/22/2011  . Poor circulation 03/22/2011  . Other malaise and fatigue 03/22/2011  . 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 NEUROPATHY 08/25/2009  . ANEMIA-IRON DEFICIENCY 04/29/2007  . HYPERLIPIDEMIA 01/29/2007  . DIABETES MELLITUS II, UNCOMPLICATED 01/10/2007  . HYPERTENSION, BENIGN SYSTEMIC 01/10/2007  . CORONARY, ARTERIOSCLEROSIS 01/10/2007  . Chronic systolic heart failure 01/10/2007  . ASTHMA, PERSISTENT 01/10/2007  . Reflux esophagitis 01/10/2007  . ARTHRITIS 01/10/2007   Past Medical History: Past Medical History  Diagnosis Date  . Breast cancer 10/2010    Invasive Ductal  Carcinoma, s/p bilateral mastectomy, now on Tamoxifen. Followed by Dr Dalene Carrow.   . Diabetes mellitus   . Hypertension   . Hyperlipidemia   . Anemia     Iron def anemia  . CAD (coronary artery disease)   . CHF (congestive heart failure)     Systolic   . History of colon cancer   . Neuropathy   . Allergy   . GERD (gastroesophageal reflux disease)   . Hiatal hernia   . Diverticulosis   . Asthma   . Arthritis   . H/O: GI bleed   . Breast cancer 2011    s/p Bilateral masectomy  . Shortness of breath   . Pneumonia 03/2012   Past Surgical History: Past Surgical History  Procedure Laterality Date  . Breast masectomy  10/2010    Bilateral masectomy by Dr Ezzard Standing s/p invasive ductal carcinoma.  . Cardiac  catherization  2007    Severe 3-vessel disease.  EF 20-25%.  . Esophagogastroduodenoscopy  2001    Esophageal Tear  . Total knee arthroplasty  2001  . Esophagogastroduodenoscopy  10/27/2012    Procedure: ESOPHAGOGASTRODUODENOSCOPY (EGD);  Surgeon: Hilarie Fredrickson, MD;  Location: [facility] ENDOSCOPY;  Service: Endoscopy;  Laterality: N/A;  pat  . Breast surgery     Social History: History  Substance Use Topics  . Smoking status: Never Smoker   . Smokeless tobacco: Current User    Types: Snuff  . Alcohol Use: No  Additional social history:  Please also refer to relevant sections of EMR.  Family History: Family History  Problem Relation Age of Onset  . Sickle cell anemia Other   . Heart disease Mother   . Heart disease Father    Allergies and Medications: Allergies  Allergen Reactions  . Peanuts [Nuts] Swelling  . Lisinopril Cough   No current facility-administered medications on file prior to encounter.   Current Outpatient Prescriptions on File Prior to Encounter  Medication Sig Dispense Refill  . albuterol (PROVENTIL HFA;VENTOLIN HFA) 108 (90 BASE) MCG/ACT inhaler Inhale 2 puffs into the lungs every 6 (six) hours as needed. For shortness of breath  1 Inhaler  6  . aspirin  (ASPIRIN CHILDRENS) 81 MG chewable tablet Chew 81 mg by mouth daily.        . carvedilol (COREG) 12.5 MG tablet Take 1 tablet (12.5 mg total) by mouth 2 (two) times daily with a meal.  60 tablet  3  . colchicine 0.6 MG tablet Take 1 tablet (0.6 mg total) by mouth once. Take 2 tablets on 1 day, if still having pain in an hour, take 1 more tablet on day 1. After that take 2 tablets daily for 2 weeks then 1 tablet daily. See [place] back in 1 week if not improving or if you develop fevers.  60 tablet  0  . HYDROcodone-acetaminophen (NORCO/VICODIN) 5-325 MG per tablet Take 1 tablet by mouth every 6 (six) hours as needed for pain. Do not fill before 04/01/13  60 tablet  1  . losartan (COZAAR) 50 MG tablet Take 1 tablet (50 mg total) by mouth daily.  30 tablet  4  . metFORMIN (GLUCOPHAGE) 500 MG tablet Take 1 tablet (500 mg total) by mouth 2 (two) times daily with a meal.  60 tablet  5  . mirtazapine (REMERON) 15 MG tablet Take 1 tablet (15 mg total) by mouth at bedtime.  30 tablet  3  . PATADAY 0.2 % SOLN Place 1 drop into both eyes daily as needed. For itchy eyes      . ranitidine (ZANTAC) 150 MG capsule Take 1 capsule (150 mg total) by mouth 2 (two) times daily.  180 capsule  6  . rosuvastatin (CRESTOR) 20 MG tablet Take 1 tablet (20 mg total) by mouth daily.  30 tablet  5  . tamoxifen (NOLVADEX) 20 MG tablet Take 1 tablet (20 mg total) by mouth daily.  30 tablet  2  . [DISCONTINUED] cetirizine (ZYRTEC) 5 MG tablet Take 1 tablet (5 mg total) by mouth daily. Take at night time as it can cause some drowsiness  30 tablet  4    Objective: BP 157/44  Pulse 59  Temp(Src) 98.3 F (36.8 C) (Oral)  Resp 14  Ht 5\' 2"  (1.575 m)  Wt 121 lb (54.885 kg)  BMI 22.13 kg/m2  SpO2 98% Exam: General: NAD initially but does start to tear up during exam and appear anxious HEENT: NCAT, sclera anicteric, PERRL, EOMI Cardiovascular: RRR, normal S1/S2, no M/R/G appreciated Respiratory: CTAB, effort normal Abdomen: soft,  nontender to palpation, bowel sounds present Extremities: no edema Skin: warm and dry Neuro: A&Ox1, CNII-XII intact. Strength 5/5 upper extremity. Strength 4+/5 leg extension, flexion, plantar flexion/extension. Hip flexors 2/5. Reflexes 2+ patellar, achilles bilaterally. Down going babinski. Sensation to light touch intact throughout. Psych: intermittently tearful, unable to state what year or country we live in, did not know that we were in a hospital, did know we were in  [place], [place]   Labs and Imaging: CBC BMET   Recent Labs Lab 07/06/13 1729  WBC 3.8*  HGB 9.8*  HCT 30.0*  PLT 135*    Recent Labs Lab 07/06/13 1729  NA 138  K 3.9  CL 104  CO2 22  BUN 18  CREATININE 0.91  GLUCOSE 141*  CALCIUM 10.4     Results for orders placed during the hospital encounter of 07/06/13 (from the past 24 hour(s))  CBC WITH DIFFERENTIAL     Status: Abnormal   Collection Time    07/06/13  5:29 PM      Result Value Range   WBC 3.8 (*) 4.0 - 10.5 K/uL   RBC 4.28  3.87 - 5.11 MIL/uL   Hemoglobin 9.8 (*) 12.0 - 15.0 g/dL   HCT 40.9 (*) 81.1 - 91.4 %   MCV 70.1 (*) 78.0 - 100.0 fL   MCH 22.9 (*) 26.0 - 34.0 pg   MCHC 32.7  30.0 - 36.0 g/dL   RDW 78.2 (*) 95.6 - 21.3 %   Platelets 135 (*) 150 - 400 K/uL   Neutrophils Relative % 42 (*) 43 - 77 %   Neutro Abs 1.6 (*) 1.7 - 7.7 K/uL   Lymphocytes Relative 44  12 - 46 %   Lymphs Abs 1.7  0.7 - 4.0 K/uL   Monocytes Relative 11  3 - 12 %   Monocytes Absolute 0.4  0.1 - 1.0 K/uL   Eosinophils Relative 3  0 - 5 %   Eosinophils Absolute 0.1  0.0 - 0.7 K/uL   Basophils Relative 0  0 - 1 %   Basophils Absolute 0.0  0.0 - 0.1 K/uL  COMPREHENSIVE METABOLIC PANEL     Status: Abnormal   Collection Time    07/06/13  5:29 PM      Result Value Range   Sodium 138  135 - 145 mEq/L   Potassium 3.9  3.5 - 5.1 mEq/L   Chloride 104  96 - 112 mEq/L   CO2 22  19 - 32 mEq/L   Glucose, Bld 141 (*) 70 - 99 mg/dL   BUN 18  6 - 23 mg/dL   Creatinine,  Ser 0.86  0.50 - 1.10 mg/dL   Calcium 57.8  8.4 - 46.9 mg/dL   Total Protein 7.2  6.0 - 8.3 g/dL   Albumin 3.4 (*) 3.5 - 5.2 g/dL   AST 13  0 - 37 U/L   ALT 7  0 - 35 U/L   Alkaline Phosphatase 34 (*) 39 - 117 U/L   Total Bilirubin 0.2 (*) 0.3 - 1.2 mg/dL   GFR calc non Af Amer 55 (*) >90 mL/min   GFR calc Af Amer 64 (*) >90 mL/min  POCT I-STAT TROPONIN I     Status: None   Collection Time    07/06/13  5:44 PM      Result Value Range   Troponin i, poc 0.00  0.00 - 0.08 ng/mL   Comment 3           CG4 I-STAT (LACTIC ACID)     Status: None   Collection Time    07/06/13  5:46 PM      Result Value Range   Lactic Acid, Venous 1.60  0.5 - 2.2 mmol/L  URINALYSIS, ROUTINE W REFLEX MICROSCOPIC     Status: Abnormal   Collection Time    07/06/13 10:05 PM      Result Value Range   Color,  Urine YELLOW  YELLOW   APPearance CLOUDY (*) CLEAR   Specific Gravity, Urine 1.017  1.005 - 1.030   pH 6.5  5.0 - 8.0   Glucose, UA NEGATIVE  NEGATIVE mg/dL   Hgb urine dipstick NEGATIVE  NEGATIVE   Bilirubin Urine NEGATIVE  NEGATIVE   Ketones, ur NEGATIVE  NEGATIVE mg/dL   Protein, ur 30 (*) NEGATIVE mg/dL   Urobilinogen, UA 1.0  0.0 - 1.0 mg/dL   Nitrite NEGATIVE  NEGATIVE   Leukocytes, UA SMALL (*) NEGATIVE  URINE MICROSCOPIC-ADD ON     Status: Abnormal   Collection Time    07/06/13 10:05 PM      Result Value Range   Squamous Epithelial / LPF FEW (*) RARE   WBC, UA 7-10  <3 WBC/hpf   RBC / HPF 0-2  <3 RBC/hpf   Bacteria, UA MANY (*) RARE   Casts HYALINE CASTS (*) NEGATIVE   Dg Chest Port 1 View  07/06/2013   *RADIOLOGY REPORT*  Clinical Data: Chest tightness/chest pain.  PORTABLE CHEST - 1 VIEW  Comparison: 01/20/2013  Findings: Lungs are well inflated and otherwise clear. Cardiomediastinal silhouette and remainder of the exam is unchanged.  IMPRESSION: No acute cardiopulmonary disease.   Original Report Authenticated By: Elberta Fortis, M.D.    Tawni Carnes, MD 07/06/2013, 9:43 PM PGY-1,  [facility] FPTS Intern pager: [phone], text pages welcome  PGY-2 addendum: I have seen and examined this patient with the intern and agree with the above note with my additions outlined in red.  Marikay Alar, MD [facility]

## 2013-07-06 NOTE — ED Notes (Signed)
Pt appears to be resting comfortably in room. Family at bedside. Vital sign continue to be WNL. IV Team to be paged for IV access.

## 2013-07-07 DIAGNOSIS — I5022 Chronic systolic (congestive) heart failure: Secondary | ICD-10-CM | POA: Diagnosis not present

## 2013-07-07 DIAGNOSIS — I10 Essential (primary) hypertension: Secondary | ICD-10-CM | POA: Diagnosis not present

## 2013-07-07 DIAGNOSIS — R29898 Other symptoms and signs involving the musculoskeletal system: Secondary | ICD-10-CM | POA: Diagnosis not present

## 2013-07-07 DIAGNOSIS — F0390 Unspecified dementia without behavioral disturbance: Secondary | ICD-10-CM | POA: Diagnosis not present

## 2013-07-07 DIAGNOSIS — R5381 Other malaise: Secondary | ICD-10-CM | POA: Diagnosis not present

## 2013-07-07 DIAGNOSIS — R209 Unspecified disturbances of skin sensation: Secondary | ICD-10-CM | POA: Diagnosis not present

## 2013-07-07 LAB — CBC
HCT: 27.8 % — ABNORMAL LOW (ref 36.0–46.0)
MCHC: 33.1 g/dL (ref 30.0–36.0)
MCV: 70 fL — ABNORMAL LOW (ref 78.0–100.0)
Platelets: 113 10*3/uL — ABNORMAL LOW (ref 150–400)
RDW: 15.4 % (ref 11.5–15.5)
WBC: 3.5 10*3/uL — ABNORMAL LOW (ref 4.0–10.5)

## 2013-07-07 LAB — BASIC METABOLIC PANEL
BUN: 16 mg/dL (ref 6–23)
Calcium: 10.3 mg/dL (ref 8.4–10.5)
Chloride: 108 mEq/L (ref 96–112)
Creatinine, Ser: 0.9 mg/dL (ref 0.50–1.10)
GFR calc Af Amer: 65 mL/min — ABNORMAL LOW (ref >90)

## 2013-07-07 LAB — GLUCOSE, CAPILLARY
Glucose-Capillary: 95 mg/dL (ref 70–99)
Glucose-Capillary: 120 mg/dL — ABNORMAL HIGH (ref 70–99)

## 2013-07-07 LAB — TSH: TSH: 2.171 u[IU]/mL (ref 0.350–4.500)

## 2013-07-07 MED ORDER — AMLODIPINE BESYLATE 10 MG PO TABS
10.0000 mg | ORAL_TABLET | Freq: Every day | ORAL | Status: DC
  Administered 2013-07-07: 10 mg via ORAL
  Filled 2013-07-07: qty 1

## 2013-07-07 MED ORDER — POLYETHYLENE GLYCOL 3350 17 G PO PACK
17.0000 g | PACK | Freq: Every day | ORAL | Status: DC
  Administered 2013-07-07: 17 g via ORAL
  Filled 2013-07-07: qty 1

## 2013-07-07 MED ORDER — MAGNESIUM HYDROXIDE 400 MG/5ML PO SUSP
5.0000 mL | Freq: Every day | ORAL | Status: DC
  Administered 2013-07-07: 5 mL via ORAL
  Filled 2013-07-07: qty 30

## 2013-07-07 MED ORDER — POLYETHYLENE GLYCOL 3350 17 G PO PACK: 17.0000 g | PACK | Freq: Two times a day (BID) | ORAL | Status: DC | PRN

## 2013-07-07 MED ORDER — AMLODIPINE BESYLATE 10 MG PO TABS: 10.0000 mg | ORAL_TABLET | Freq: Every day | ORAL | Status: DC

## 2013-07-07 NOTE — Progress Notes (Signed)
FMTS Attending Daily Note:  Renold Don MD  [phone] pager  Family Practice pager:  [phone] I have discussed this patient with the resident Dr. Waynetta Sandy and attending physician Dr. Lum Babe.  I agree with their findings, assessment, and care plan.

## 2013-07-07 NOTE — Progress Notes (Signed)
Family Medicine Teaching Service Daily Progress Note Intern Pager: [phone]  [name] Medical record number: [id number] Date of birth: 01/08/1924 Age: 79 y.o. Gender: female  Primary Care Provider: Marena Chancy, MD Consultants: none Code Status: DNR  Pt Overview and Major Events to Date:   Assessment and Plan: [name] is a 79 y.o. year old female presenting with 3 day onset of bilateral weakness and numbness of the legs below the knees. PMH is significant for DM, HTN, HLD, CAD, CHF, breast cancer, anemia, neuropathy, dementia.   # Bilateral weakness: weakness at baseline and uses walker but increasing past 3 days in lower legs. Refuses to walk today, although strength in legs on exam grossly normal for age except for hip flexion. Differential includes psychogenic cause related to stress, worsening neuropathy, worsening dementia and refusal to participate, neurological causes such as guillan barre, medication adverse effect (potentially vicodin). Additionally potentially related to pain due to neuropathy or arthritis. Lower suspicion would be for mass lesion of the spine (less likely given near normal strength, sensation, and reflexes on exam) or CVA (less likely given bilateral symptoms and near normal neuro exam).  - Admitting physician Dr. Lum Babe  - Telemetry-continuous cardiopulmonary monitoring for potential respiratory compromise  - Neuro checks q4 hrs for progression of weakness  - [ ]  f/u TSH, B12  - consider addition of treatment for arthritis or neuropathy if these are deemed to be causing this issue  - BMET wnl - PT recommends home health PT on discharge  # Confusion / Dementia: MMSE 23 on 05/15/13. Took vicodin earlier today, may have decompensated due to this. A&Ox1 (City/state location) near end of exam in ED, patient appeared very anxious after telling her the plan to admit her. May be related to psychological stressors given home environment and recent  death of a cousin.  - Neuro checks as above  - Hold vicodin, mirtazpine  - will continue to monitor   # CHF: most recent echo 3/14 EF 65-70%, grade 1 diastolic dysfunction  - will follow volume status   # Anemia, stable: chronic  - Hgb 9.8, MCV 70.1  - Likely iron deficient  - consider iron supplement  - Hgb 9.2 this AM  # HTN: BPs in 120-170s/40s-90s  - Continue home carvedilol, losartan  # Diabetes mellitus: last A1c 7.2 (12/2012)  - Sensitive SSI and HS coverage  # Hyperlipidemia:  - Continue Crestor   FEN/GI: carb modified  Prophylaxis: heparin  Disposition: discharge to home pending home health  Subjective: Patient doing better this AM. Says she is doing "fine". No complaints of pain and strength is improved. Has not been out of bed yet. Denies any pain. She does feel strong urge to void during exam, nurse called in for bed pan.   Objective: Temp:  [98.3 F (36.8 C)] 98.3 F (36.8 C) (08/24 1615) Pulse Rate:  [59-72] 63 (08/24 2333) Resp:  [10-19] 19 (08/24 2100) BP: (121-215)/(44-93) 145/70 mmHg (08/24 2333) SpO2:  [95 %-100 %] 100 % (08/24 2100) Weight:  [121 lb (54.885 kg)-122 lb 5.7 oz (55.5 kg)] 122 lb 5.7 oz (55.5 kg) (08/24 2323) Physical Exam: General: NAD, laying in bed sleeping but easily awoken Cardiovascular: RRR, normal s1/s2, no m/r/g appreciated Respiratory: CTAB, effort normal Abdomen: soft, nontender to palpation, bowel sounds present Extremities: Strength in hip flexors improved today, did not have her walk. 2+ reflexes patellar and achilles bilaterally. Sensation to light touch grossly intact in lower extremities bilaterally  Laboratory:  Recent Labs Lab 07/06/13 1729 07/07/13 0527  WBC 3.8* 3.5*  HGB 9.8* 9.2*  HCT 30.0* 27.8*  PLT 135* 113*    Recent Labs Lab 07/06/13 1729 07/07/13 0527  NA 138 140  K 3.9 3.5  CL 104 108  CO2 22 23  BUN 18 16  CREATININE 0.91 0.90  CALCIUM 10.4 10.3  PROT 7.2  --   BILITOT 0.2*  --    ALKPHOS 34*  --   ALT 7  --   AST 13  --   GLUCOSE 141* 105*    Imaging/Diagnostic Tests:   Tawni Carnes, MD 07/07/2013, 7:07 AM PGY-1, [facility] FPTS Intern pager: [phone], text pages welcome

## 2013-07-07 NOTE — Telephone Encounter (Addendum)
Printed Order and will fax it to [facility]  [name], PGY-3 Family Medicine Resident

## 2013-07-07 NOTE — Care Management Note (Signed)
  Page 1 of 1   07/07/2013     12:09:23 PM   CARE MANAGEMENT NOTE 07/07/2013  Patient:  [name], [name]   Account Number:  [id number]  Date Initiated:  07/07/2013  Documentation initiated by:  Ronny Flurry  Subjective/Objective Assessment:     Action/Plan:   Anticipated DC Date:  07/07/2013   Anticipated DC Plan:  HOME W HOME HEALTH SERVICES         Choice offered to / List presented to:  C-1 Patient        HH arranged  HH-2 PT  HH-3 OT      [facility] agency  [facility]   Status of service:  Completed, signed off Medicare Important Message given?   (If response is "NO", the following Medicare IM given date fields will be blank) Date Medicare IM given:   Date Additional Medicare IM given:    Discharge Disposition:    Per UR Regulation:    If discussed at Long Length of Stay Meetings, dates discussed:    Comments:

## 2013-07-07 NOTE — Evaluation (Addendum)
Physical Therapy Evaluation Patient Details Name: [name] MRN: [id number] DOB: 25-Dec-1923 Today's Date: 07/07/2013 Time: 4098-1191 PT Time Calculation (min): 20 min  PT Assessment / Plan / Recommendation History of Present Illness  Pt adm due to bil LE weakness and numbness; PMH includes HTN, HLD, DM, breast cancer, CAD, and anemia.   Clinical Impression  Pt is a 79 y.o. Female adm to [facility] due to bil LE weakness and c/o numbness in LEs, resulting in functional limitations due to the deficits listed below (see PT Problem List).  Patient will benefit from skilled PT to increase their independence and safety with mobility to allow discharge to the venue listed below. Pt is a fall risk  And is encouraged to amb with AD with nursing. Pt is adamant about refusing STSNF upon acute D/C. Will benefit from HHPT and require 24/7 supervision upon acute D/C due to deficits.      PT Assessment  Patient needs continued PT services    Follow Up Recommendations  Home health PT;Supervision/Assistance - 24 hour    Does the patient have the potential to tolerate intense rehabilitation    no   Barriers to Discharge   pt stated she has times where is alone    Equipment Recommendations  None recommended by PT    Recommendations for Other Services OT consult   Frequency Min 3X/week    Precautions / Restrictions Precautions Precautions: Fall Precaution Comments: pt reports she fell 4 months ago but did not come to the hospital Restrictions Weight Bearing Restrictions: No   Pertinent Vitals/Pain "no pain, just tired"  BP 125/10 in sitting EOB no c/o dizziness       Mobility  Bed Mobility Bed Mobility: Supine to Sit;Sit to Supine Supine to Sit: 5: Supervision;HOB elevated;With rails Sit to Supine: HOB flat;With rail;4: Min assist Details for Bed Mobility Assistance: pt had difficulty brining LEs into bed after amb due to fatigue; pt requires min cues for sequencing and safey; pt impulsive  with supine to sit due to urgency to use [facility] Transfers Transfers: Sit to Stand;Stand to Sit Sit to Stand: 4: Min assist;From bed;With upper extremity assist Stand to Sit: 4: Min assist;To bed;With upper extremity assist Details for Transfer Assistance: pt became imulsive with transfers; performed SPT to [facility] due to urgency of need to go; pt required min (A) to maintain balance and complete transfers; pt also became impulsive after amb due to fatigue and began to sit on bed with decreased awareness of safety; cues for hand placement and safety  Ambulation/Gait Ambulation/Gait Assistance: 4: Min assist Ambulation Distance (Feet): 14 Feet Assistive device: Rolling walker Ambulation/Gait Assistance Details: pt demo SOB due to fatigue and decreased activity tolerance; pt unsteady with gt; stated "my legs are going to give out"; (A) to maintain balance; mod-max cues for gt sequencing and safety with RW: pt required (A) to manage RW with directional changes Gait Pattern: Decreased stride length;Shuffle;Trunk flexed;Wide base of support Gait velocity: decreased  General Gait Details: pt demo decreased safety awareness with gt; pt demo SOB with amb on RA O2 stat at 100% after completion of short distanced walk  Stairs: No Wheelchair Mobility Wheelchair Mobility: No    Exercises General Exercises - Lower Extremity Ankle Circles/Pumps: AROM;Both;10 reps;Supine   PT Diagnosis: Difficulty walking;Generalized weakness  PT Problem List: Decreased strength;Decreased activity tolerance;Decreased balance;Decreased mobility;Decreased knowledge of use of DME;Decreased safety awareness PT Treatment Interventions: DME instruction;Gait training;Stair training;Functional mobility training;Therapeutic activities;Therapeutic exercise;Balance training;Neuromuscular re-education;Patient/family education  PT Goals(Current goals can be found in the care plan section) Acute Rehab PT Goals Patient Stated Goal: to go  home PT Goal Formulation: With patient Potential to Achieve Goals: Good  Visit Information  Last PT Received On: 07/07/13 Assistance Needed: +1 History of Present Illness: Pt adm due to bil LE weakness and numbness; PMH includes HTN, HLD, DM, breast cancer, CAD, and anemia.        Prior Functioning  Home Living Family/patient expects to be discharged to:: Private residence Living Arrangements: Children Available Help at Discharge: Family Type of Home: House Home Access: Stairs to enter Secretary/administrator of Steps: 1 Entrance Stairs-Rails: Right Home Layout: One level Home Equipment: Environmental consultant - 2 wheels;Walker - 4 wheels;Cane - single point;Bedside commode Prior Function Level of Independence: Needs assistance Gait / Transfers Assistance Needed: pt reports she amb in house without AD; when outside she amb with SPC  ADL's / Homemaking Assistance Needed: Pt has aide who comes in and helps 3hrs per day mon-friday; Son and family help with homemaking and meals  Communication Communication: No difficulties    Cognition  Cognition Arousal/Alertness: Awake/alert Behavior During Therapy: WFL for tasks assessed/performed Overall Cognitive Status: No family/caregiver present to determine baseline cognitive functioning Area of Impairment: Orientation;Safety/judgement;Memory Orientation Level: Disoriented to;Situation;Time Memory: Decreased short-term memory Safety/Judgement: Decreased awareness of safety General Comments: no family present; pt poor historian and says "im forgetful"     Extremity/Trunk Assessment Upper Extremity Assessment Upper Extremity Assessment: Overall WFL for tasks assessed Lower Extremity Assessment Lower Extremity Assessment: Generalized weakness;LLE deficits/detail;RLE deficits/detail RLE Deficits / Details: grossly 3/5 in knee and hip  RLE Sensation: decreased light touch (c/o numbness) LLE Deficits / Details: pt c/o with pain in knee during MMT  LLE:  Unable to fully assess due to pain LLE Sensation: decreased light touch (c/o numbness in bil LEs ) Cervical / Trunk Assessment Cervical / Trunk Assessment: Kyphotic   Balance Balance Balance Assessed: Yes Static Standing Balance Static Standing - Balance Support: Right upper extremity supported;During functional activity Static Standing - Level of Assistance: 4: Min assist Static Standing - Comment/# of Minutes: pt attempted to perform perineal hygiene at [facility]; pt unsteady with single UE support and required (A) to maintain balance during activity   End of Session PT - End of Session Equipment Utilized During Treatment: Gait belt Activity Tolerance: Patient limited by fatigue Patient left: in bed;with call bell/phone within reach Nurse Communication: Mobility status  GP Functional Assessment Tool Used: clinical judgement Functional Limitation: Mobility: Walking and moving around Mobility: Walking and Moving Around Current Status (Z6010): At least 1 percent but less than 20 percent impaired, limited or restricted Mobility: Walking and Moving Around Goal Status [phone]): 0 percent impaired, limited or restricted   Donell Sievert, [place] [phone] 07/07/2013, 10:26 AM

## 2013-07-07 NOTE — H&P (Signed)
FMTS Attending Admission Note: [name] Stohr,MD I  have seen and examined this patient, reviewed their chart. I have discussed this patient with the resident. I agree with the resident's findings, assessment and care plan.  Briefly 79 Y/O f with PMX of DM,HTN,HLD,CHF,Asthma,Arthritis,LL weakness,present to the hospital for worsening LL weakness from her knee to ankle,patient was not forthcoming with information this morning,however as per resident weakness started from her ankle b/l and then moved to her knee,today patient denies any pain,feels better with her weakness,denies recent fall.  Filed Vitals:   07/06/13 2145 07/06/13 2323 07/06/13 2333 07/07/13 0700  BP: 121/93 215/55 145/70 154/44  Pulse:  61 63 60  Temp:    98 F (36.7 C)  TempSrc:      Resp:    20  Height:  5\' 1"  (1.549 m)    Weight:  122 lb 5.7 oz (55.5 kg)    SpO2:    98%   Exam: Gen: Comfortable in bed, a little sleepy this morning. Resp: Air entry equal B/l,no wheezing. Heart: S1 S2 normal,no murmurs.RRR. Abd: Benign. Ext: No edema B/L. Neuro: DTR++, Power about 3-4/5 across all joints including lower limbs,she was able to lift both legs off the bed. No sensory loss.  A/P: 79 y/o f with; 1. LL Weakness with numbness; Seem to have improved this morning.     Etiology unclear,?? Arthritis,?? Stress related,?? Conversion syndrome, old age related weakness.     I recommend PT evaluation, fall precaution.     Due to hx of breast cancer,if symptom worsens to consider spinal imaging vs curbside consult with neurologist.  2. Dementia/Confusion: She does not appear confused to me,she does not know the date but she knows where she is.     Communication is coherent but not interested in talking.     Recommend Neuro-check and MMSE.     Hold sedative medications.  Continue home med for other chronic conditions.

## 2013-07-07 NOTE — Addendum Note (Signed)
Addended by: Lonia Skinner on: 07/07/2013 05:33 PM   Modules accepted: Orders

## 2013-07-08 DIAGNOSIS — E1142 Type 2 diabetes mellitus with diabetic polyneuropathy: Secondary | ICD-10-CM | POA: Diagnosis not present

## 2013-07-08 DIAGNOSIS — E119 Type 2 diabetes mellitus without complications: Secondary | ICD-10-CM | POA: Diagnosis not present

## 2013-07-08 DIAGNOSIS — Z5189 Encounter for other specified aftercare: Secondary | ICD-10-CM | POA: Diagnosis not present

## 2013-07-08 DIAGNOSIS — R269 Unspecified abnormalities of gait and mobility: Secondary | ICD-10-CM | POA: Diagnosis not present

## 2013-07-08 DIAGNOSIS — M6281 Muscle weakness (generalized): Secondary | ICD-10-CM | POA: Diagnosis not present

## 2013-07-08 LAB — URINE CULTURE: Colony Count: >=100000

## 2013-07-08 NOTE — Discharge Summary (Signed)
Family Medicine Teaching [facility] Discharge Summary  [name] Medical record number: [id number] Date of birth: 1924-04-14 Age: 79 y.o. Gender: female Date of Admission: 07/06/2013  Date of Discharge: 07/07/2013 Admitting Physician: Janit Pagan, MD  Primary Care Provider: Marena Chancy, MD Consultants: none  Indication for Hospitalization: bilateral lower leg weakness and numbness  Discharge Diagnoses/Problem List:  Stress decompensation vs Conversion causing bilateral leg weakness Mild dementia Anemia Chronic heart failure Hypertension Diabetes mellitus Hyperlipidemia  Disposition: discharge to home with home health PT  Discharge Condition: Stable  Discharge Exam:  BP 125/64  Pulse 74  Temp(Src) 98.3 F (36.8 C) (Oral)  Resp 16  Ht 5\' 1"  (1.549 m)  Wt 122 lb 5.7 oz (55.5 kg)  BMI 23.13 kg/m2  SpO2 100% General: NAD, laying in bed sleeping but easily awoken  Cardiovascular: RRR, normal s1/s2, no m/r/g appreciated  Respiratory: CTAB, effort normal  Abdomen: soft, nontender to palpation, bowel sounds present  Extremities: Strength 4+/5 bilaterally for: leg extension, flexion, hip adduction, hip abduction, plantar flexion/extension. Hip flexion 3+/5 Neuro: 2+ reflexes patellar and achilles bilaterally. Sensation to light touch grossly intact in lower extremities bilaterally. Oriented to person, place, but not time or president.  Brief Hospital Course:  [name] BOISSONNEAULT is a 79 yo female that presented with 3 day history of increasing bilateral weakness and numbness of the legs below the knees. PMHx is significant for DM, HTN, HLD, CAD, CHF, breast cancer on tamoxifen, anemia, neuropathy, mild dementia. Strength grossly normal on initial exam except for hip flexion. While in the ED she acted confused, had taken pain pill earlier that day (believed to be vicodin), and also had social stressor with son coming home inebriated.  Did well overnight with  neuro checks every 4 hours, and in morning strength improved. She was able to walk during PT eval and strength appeared at baseline. Orientation improved but still showed signs of dementia. Patient strength and function near baseline on discharge.  # Stress decompensation vs Conversion causing bilateral leg weakness: weakness at baseline and uses walker but increasing past 3 days in lower legs. Refused to walk today in ED, although strength in legs on exam grossly normal for age except for hip flexion.  She was kept on telemetry and pulse oximetry overnight to monitor respiratory status. In the morning strength was improved and was able to ambulate at baseline. Discharged home with home PT and presumed acute decompensation due to either stress or possible brief conversion disorder.   # Confusion / Dementia: MMSE 23 on 05/15/13. Believed to have taken vicodin prior to ED, may have decompensated due to this. A&Ox1 (city location). Patient orientation improved by the morning, though still disoriented to year and president (able to get with leading from daughter).   # CHF, stable: most recent echo 3/14 EF 65-70%, grade 1 diastolic dysfunction  - Monitored during hospitalization, stable throughout, no changes on discharge. # Anemia, stable: chronic  - Admission: Hgb 9.8, MCV 70.1, RDW 15.8, discharge CBC Hgb 9.2 - Likely iron deficiency, supplements not started while in hospital # HTN: BPs in 120-170s/40s-90s  - Continued home carvedilol, losartan. Had elevated BPs during hospital stay as high as 200 systolic, but diastolic remained in 50-70 range. No changes to BP medications. # Diabetes mellitus, stable: last A1c 7.2 (12/2012)  - CBGs in 95-143 range, no issues while in hospital. # Hyperlipidemia:  - Continued home Crestor   Issues for Follow Up:  1. Bilateral leg weakness:  home health PT for strengthening 2. Pancytopenia: likely at baseline based on chart review. Anemia likely due to iron  deficiency 3. Dementia: repeat MMSE and monitor 4. UTI: positive for e. Coli after patient discharged, Dr. Lum Babe called patient and prescribed antibiotics  Significant Procedures: none  Significant Labs and Imaging:   Recent Labs Lab 07/06/13 1729 07/07/13 0527  WBC 3.8* 3.5*  HGB 9.8* 9.2*  HCT 30.0* 27.8*  PLT 135* 113*    Recent Labs Lab 07/06/13 1729 07/07/13 0527  NA 138 140  K 3.9 3.5  CL 104 108  CO2 22 23  GLUCOSE 141* 105*  BUN 18 16  CREATININE 0.91 0.90  CALCIUM 10.4 10.3  ALKPHOS 34*  --   AST 13  --   ALT 7  --   ALBUMIN 3.4*  --      Results/Tests Pending at Time of Discharge: none  Discharge Medications:    Medication List    STOP taking these medications       HYDROcodone-acetaminophen 5-325 MG per tablet  Commonly known as:  NORCO/VICODIN      TAKE these medications       albuterol 108 (90 BASE) MCG/ACT inhaler  Commonly known as:  PROVENTIL HFA;VENTOLIN HFA  Inhale 2 puffs into the lungs every 6 (six) hours as needed. For shortness of breath     amLODipine 10 MG tablet  Commonly known as:  NORVASC  Take 1 tablet (10 mg total) by mouth daily.     ASPIRIN CHILDRENS 81 MG chewable tablet  Generic drug:  aspirin  Chew 81 mg by mouth daily.     carvedilol 12.5 MG tablet  Commonly known as:  COREG  Take 1 tablet (12.5 mg total) by mouth 2 (two) times daily with a meal.     colchicine 0.6 MG tablet  Take 1 tablet (0.6 mg total) by mouth once. Take 2 tablets on 1 day, if still having pain in an hour, take 1 more tablet on day 1. After that take 2 tablets daily for 2 weeks then 1 tablet daily. See [place] back in 1 week if not improving or if you develop fevers.     losartan 50 MG tablet  Commonly known as:  COZAAR  Take 1 tablet (50 mg total) by mouth daily.     metFORMIN 500 MG tablet  Commonly known as:  GLUCOPHAGE  Take 1 tablet (500 mg total) by mouth 2 (two) times daily with a meal.     mirtazapine 15 MG tablet  Commonly known  as:  REMERON  Take 1 tablet (15 mg total) by mouth at bedtime.     PATADAY 0.2 % Soln  Generic drug:  Olopatadine HCl  Place 1 drop into both eyes daily as needed. For itchy eyes     polyethylene glycol packet  Commonly known as:  MIRALAX / GLYCOLAX  Take 17 g by mouth 2 (two) times daily as needed.     ranitidine 150 MG capsule  Commonly known as:  ZANTAC  Take 1 capsule (150 mg total) by mouth 2 (two) times daily.     rosuvastatin 20 MG tablet  Commonly known as:  CRESTOR  Take 1 tablet (20 mg total) by mouth daily.     tamoxifen 20 MG tablet  Commonly known as:  NOLVADEX  Take 1 tablet (20 mg total) by mouth daily.        Discharge Instructions: Please refer to Patient Instructions section of EMR for full  details.  Patient was counseled important signs and symptoms that should prompt return to medical care, changes in medications, dietary instructions, activity restrictions, and follow up appointments.   Follow-Up Appointments:     Follow-up Information   Follow up with Marena Chancy, MD On 07/17/2013. (9:30 am)    Specialty:  Family Medicine   Contact information:   [address] [place] [address] [phone]       Follow up with [facility]. Schedule an appointment as soon as possible for a visit on 07/09/2013. (for nurse blood pressure check)    Contact information:   [address] [place] [address] [phone]      Tawni Carnes, MD 07/08/2013, 1:59 PM PGY-1, [facility]

## 2013-07-09 ENCOUNTER — Telehealth: Payer: Self-pay | Admitting: Family Medicine

## 2013-07-09 ENCOUNTER — Other Ambulatory Visit: Payer: Self-pay | Admitting: Family Medicine

## 2013-07-09 DIAGNOSIS — Z5189 Encounter for other specified aftercare: Secondary | ICD-10-CM | POA: Diagnosis not present

## 2013-07-09 DIAGNOSIS — E119 Type 2 diabetes mellitus without complications: Secondary | ICD-10-CM | POA: Diagnosis not present

## 2013-07-09 DIAGNOSIS — R269 Unspecified abnormalities of gait and mobility: Secondary | ICD-10-CM | POA: Diagnosis not present

## 2013-07-09 DIAGNOSIS — M6281 Muscle weakness (generalized): Secondary | ICD-10-CM | POA: Diagnosis not present

## 2013-07-09 DIAGNOSIS — E1142 Type 2 diabetes mellitus with diabetic polyneuropathy: Secondary | ICD-10-CM | POA: Diagnosis not present

## 2013-07-09 MED ORDER — LEVOFLOXACIN 500 MG PO TABS: 500.0000 mg | ORAL_TABLET | Freq: Every day | ORAL | Status: AC

## 2013-07-09 NOTE — Telephone Encounter (Signed)
UTI_Ecoli sensitive to Levaquin, I called and spoke with patient's son [name] about the result and need to pick up A/B from pharmacy. He mentioned to me his mother is doing fine. He agreed to pick up A/B and follow up with Dr Losq in Sept. 

## 2013-07-09 NOTE — Telephone Encounter (Signed)
UTI_Ecoli sensitive to Levaquin, I called and spoke with patient's son [name] about the result and need to pick up A/B from pharmacy. He mentioned to me his mother is doing fine. He agreed to pick up A/B and follow up with Dr Gwenlyn Saran in Sept.

## 2013-07-10 ENCOUNTER — Telehealth: Payer: Self-pay | Admitting: Family Medicine

## 2013-07-10 NOTE — Telephone Encounter (Signed)
I spoke with patient this morning,[name] is doing well, [name] picked up her antibiotic for urine infection,I reminded her of her appointment with Dr Gwenlyn Saran on 07/17/13,[name] is aware of this. Will expect repeat urine culture during that visit.

## 2013-07-11 DIAGNOSIS — R269 Unspecified abnormalities of gait and mobility: Secondary | ICD-10-CM | POA: Diagnosis not present

## 2013-07-11 DIAGNOSIS — E119 Type 2 diabetes mellitus without complications: Secondary | ICD-10-CM | POA: Diagnosis not present

## 2013-07-11 DIAGNOSIS — M6281 Muscle weakness (generalized): Secondary | ICD-10-CM | POA: Diagnosis not present

## 2013-07-11 DIAGNOSIS — E1142 Type 2 diabetes mellitus with diabetic polyneuropathy: Secondary | ICD-10-CM | POA: Diagnosis not present

## 2013-07-11 DIAGNOSIS — Z5189 Encounter for other specified aftercare: Secondary | ICD-10-CM | POA: Diagnosis not present

## 2013-07-12 NOTE — Discharge Summary (Signed)
Family Medicine Teaching Service  Discharge Note : Attending Jeff Bueford Arp MD Pager [phone] Inpatient Team Pager:  [phone]  I have reviewed this patient and the patient's chart and have discussed discharge planning with the resident at the time of discharge. I agree with the discharge plan as above.    

## 2013-07-15 ENCOUNTER — Telehealth: Payer: Self-pay | Admitting: *Deleted

## 2013-07-15 NOTE — Telephone Encounter (Signed)
Please call in order for Home health OT twice a week for 4 weeks.   Marena Chancy, PGY-3 Family Medicine Resident

## 2013-07-15 NOTE — Telephone Encounter (Signed)
Called and left a message for [name] at [facility] to return my call.  Gaylene Brooks, RN

## 2013-07-15 NOTE — Telephone Encounter (Signed)
Junious Dresser, OT--calling from [facility].  Needs verbal order for recent admission for home health and OT.  Would like twice a week x 4 weeks.  Will route request to Dr. Gwenlyn Saran and call back.  Gaylene Brooks, RN

## 2013-07-16 DIAGNOSIS — E1142 Type 2 diabetes mellitus with diabetic polyneuropathy: Secondary | ICD-10-CM | POA: Diagnosis not present

## 2013-07-16 DIAGNOSIS — R269 Unspecified abnormalities of gait and mobility: Secondary | ICD-10-CM | POA: Diagnosis not present

## 2013-07-16 DIAGNOSIS — E119 Type 2 diabetes mellitus without complications: Secondary | ICD-10-CM | POA: Diagnosis not present

## 2013-07-16 DIAGNOSIS — Z5189 Encounter for other specified aftercare: Secondary | ICD-10-CM | POA: Diagnosis not present

## 2013-07-16 DIAGNOSIS — M6281 Muscle weakness (generalized): Secondary | ICD-10-CM | POA: Diagnosis not present

## 2013-07-17 ENCOUNTER — Encounter: Payer: Self-pay | Admitting: Family Medicine

## 2013-07-17 ENCOUNTER — Ambulatory Visit (INDEPENDENT_AMBULATORY_CARE_PROVIDER_SITE_OTHER): Payer: Medicare Other | Admitting: Family Medicine

## 2013-07-17 VITALS — BP 144/72 | HR 84 | Ht 59.0 in | Wt 123.0 lb

## 2013-07-17 DIAGNOSIS — M545 Low back pain, unspecified: Secondary | ICD-10-CM

## 2013-07-17 DIAGNOSIS — Z853 Personal history of malignant neoplasm of breast: Secondary | ICD-10-CM

## 2013-07-17 DIAGNOSIS — G4700 Insomnia, unspecified: Secondary | ICD-10-CM | POA: Diagnosis not present

## 2013-07-17 DIAGNOSIS — D649 Anemia, unspecified: Secondary | ICD-10-CM

## 2013-07-17 DIAGNOSIS — R413 Other amnesia: Secondary | ICD-10-CM

## 2013-07-17 DIAGNOSIS — D509 Iron deficiency anemia, unspecified: Secondary | ICD-10-CM

## 2013-07-17 DIAGNOSIS — N390 Urinary tract infection, site not specified: Secondary | ICD-10-CM | POA: Diagnosis not present

## 2013-07-17 LAB — IRON AND TIBC
%SAT: 19 % — ABNORMAL LOW (ref 20–55)
TIBC: 281 ug/dL (ref 250–470)

## 2013-07-17 MED ORDER — TRAZODONE HCL 50 MG PO TABS: 50.0000 mg | ORAL_TABLET | Freq: Every evening | ORAL | Status: DC | PRN

## 2013-07-17 MED ORDER — HYDROCODONE-ACETAMINOPHEN 5-325 MG PO TABS: 1.0000 | ORAL_TABLET | Freq: Every day | ORAL | Status: DC | PRN

## 2013-07-17 NOTE — Patient Instructions (Addendum)
Follow up in the beginning of October for your diabetes check.  Otherwise, everything looked good.

## 2013-07-18 DIAGNOSIS — E119 Type 2 diabetes mellitus without complications: Secondary | ICD-10-CM | POA: Diagnosis not present

## 2013-07-18 DIAGNOSIS — M6281 Muscle weakness (generalized): Secondary | ICD-10-CM | POA: Diagnosis not present

## 2013-07-18 DIAGNOSIS — E1142 Type 2 diabetes mellitus with diabetic polyneuropathy: Secondary | ICD-10-CM | POA: Diagnosis not present

## 2013-07-18 DIAGNOSIS — Z5189 Encounter for other specified aftercare: Secondary | ICD-10-CM | POA: Diagnosis not present

## 2013-07-18 DIAGNOSIS — R269 Unspecified abnormalities of gait and mobility: Secondary | ICD-10-CM | POA: Diagnosis not present

## 2013-07-18 LAB — CBC
Hemoglobin: 9.6 g/dL — ABNORMAL LOW (ref 12.0–15.0)
MCH: 22.2 pg — ABNORMAL LOW (ref 26.0–34.0)
MCV: 72.7 fL — ABNORMAL LOW (ref 78.0–100.0)
RBC: 4.32 MIL/uL (ref 3.87–5.11)

## 2013-07-18 NOTE — Telephone Encounter (Signed)
Called Junious Dresser at [facility] and left detailed message that verbal order being given for Home health OT per their request.  Gaylene Brooks, RN

## 2013-07-20 DIAGNOSIS — N390 Urinary tract infection, site not specified: Secondary | ICD-10-CM | POA: Insufficient documentation

## 2013-07-20 NOTE — Assessment & Plan Note (Signed)
Refilled trazadone. Will monitor mental status on medication. It was likely stopped in context of confusion in the hospital.

## 2013-07-20 NOTE — Assessment & Plan Note (Signed)
Repeat CBC to sensure stability. Will also check iron studies.

## 2013-07-20 NOTE — Assessment & Plan Note (Signed)
Possible explanation for weakness and confusion while in hospital. Symptoms have resolved on antibiotic.

## 2013-07-20 NOTE — Assessment & Plan Note (Signed)
Informal MMSE done with a couple items not asked. She is fully oriented (back to her baseline), which is a change from when she was hospitalized.  Will repeat formal MMSE at next visit when not so close in time to acute hospitalization. If worsening score compared to July, will consider starting her on aricept.

## 2013-07-20 NOTE — Progress Notes (Signed)
Patient ID: [name]    DOB: December 22, 1923, 79 y.o.   MRN: [id number] --- Subjective:  [name] is a 79 y.o.female who presents for hospital follow up. She was admitted to the hospital on 07/06/13 with leg weakness, inability to walk, confusion. She was diagnosed with E-coli UTI and treated. She will finish treatment today. She denies any abdominal pain, dysuria, hematuria, increased urinary frequency compared to baseline.  Since her hospital discharge, she is almost back to her baseline, apart from some episodic occurences where she forgets where she put something or why she is in a certain place. Her daughter is with her, who confirms this. Patient does state though that she is able to remember to take her medicine.  She has been undergoing PT/OT at the home.  She ambulates with her cane and denies any new weakness.     ROS: see HPI Past Medical History: reviewed and updated medications and allergies. Social History: Tobacco: chews tobacco  Objective: Filed Vitals:   07/17/13 0953  BP: 144/72  Pulse: 84    Physical Examination:   General appearance - alert, well appearing, and in no distress Chest - clear to auscultation, no wheezes, rales or rhonchi, symmetric air entry Heart - normal rate, regular rhythm, normal S1, S2, 3/6 systolic murmur best heard at right sternal border Abdomen - soft, nontender, nondistended Extremities - no edema Mental status - alert and oriented to person, time and place. Unable to spell world backwards or subtract 7 due to not having learned spelling and counting. Registers 3 words and recalls 2/3. Follows written command, follows verbal command, unable to reproduce hexagons.

## 2013-07-20 NOTE — Assessment & Plan Note (Signed)
Refilled vicodin for pain in scar tissue from mastectomy

## 2013-07-22 DIAGNOSIS — Z5189 Encounter for other specified aftercare: Secondary | ICD-10-CM | POA: Diagnosis not present

## 2013-07-22 DIAGNOSIS — M6281 Muscle weakness (generalized): Secondary | ICD-10-CM | POA: Diagnosis not present

## 2013-07-22 DIAGNOSIS — E119 Type 2 diabetes mellitus without complications: Secondary | ICD-10-CM | POA: Diagnosis not present

## 2013-07-22 DIAGNOSIS — R269 Unspecified abnormalities of gait and mobility: Secondary | ICD-10-CM | POA: Diagnosis not present

## 2013-07-22 DIAGNOSIS — E1142 Type 2 diabetes mellitus with diabetic polyneuropathy: Secondary | ICD-10-CM | POA: Diagnosis not present

## 2013-07-23 DIAGNOSIS — E1142 Type 2 diabetes mellitus with diabetic polyneuropathy: Secondary | ICD-10-CM | POA: Diagnosis not present

## 2013-07-23 DIAGNOSIS — R269 Unspecified abnormalities of gait and mobility: Secondary | ICD-10-CM | POA: Diagnosis not present

## 2013-07-23 DIAGNOSIS — M6281 Muscle weakness (generalized): Secondary | ICD-10-CM | POA: Diagnosis not present

## 2013-07-23 DIAGNOSIS — E119 Type 2 diabetes mellitus without complications: Secondary | ICD-10-CM | POA: Diagnosis not present

## 2013-07-23 DIAGNOSIS — Z5189 Encounter for other specified aftercare: Secondary | ICD-10-CM | POA: Diagnosis not present

## 2013-07-25 DIAGNOSIS — E119 Type 2 diabetes mellitus without complications: Secondary | ICD-10-CM | POA: Diagnosis not present

## 2013-07-25 DIAGNOSIS — R269 Unspecified abnormalities of gait and mobility: Secondary | ICD-10-CM | POA: Diagnosis not present

## 2013-07-25 DIAGNOSIS — E1142 Type 2 diabetes mellitus with diabetic polyneuropathy: Secondary | ICD-10-CM | POA: Diagnosis not present

## 2013-07-25 DIAGNOSIS — M6281 Muscle weakness (generalized): Secondary | ICD-10-CM | POA: Diagnosis not present

## 2013-07-25 DIAGNOSIS — Z5189 Encounter for other specified aftercare: Secondary | ICD-10-CM | POA: Diagnosis not present

## 2013-07-28 DIAGNOSIS — E1142 Type 2 diabetes mellitus with diabetic polyneuropathy: Secondary | ICD-10-CM | POA: Diagnosis not present

## 2013-07-28 DIAGNOSIS — Z5189 Encounter for other specified aftercare: Secondary | ICD-10-CM | POA: Diagnosis not present

## 2013-07-28 DIAGNOSIS — E119 Type 2 diabetes mellitus without complications: Secondary | ICD-10-CM | POA: Diagnosis not present

## 2013-07-28 DIAGNOSIS — M6281 Muscle weakness (generalized): Secondary | ICD-10-CM | POA: Diagnosis not present

## 2013-07-28 DIAGNOSIS — R269 Unspecified abnormalities of gait and mobility: Secondary | ICD-10-CM | POA: Diagnosis not present

## 2013-07-30 DIAGNOSIS — R269 Unspecified abnormalities of gait and mobility: Secondary | ICD-10-CM | POA: Diagnosis not present

## 2013-07-30 DIAGNOSIS — E119 Type 2 diabetes mellitus without complications: Secondary | ICD-10-CM | POA: Diagnosis not present

## 2013-07-30 DIAGNOSIS — E1142 Type 2 diabetes mellitus with diabetic polyneuropathy: Secondary | ICD-10-CM | POA: Diagnosis not present

## 2013-07-30 DIAGNOSIS — Z5189 Encounter for other specified aftercare: Secondary | ICD-10-CM | POA: Diagnosis not present

## 2013-07-30 DIAGNOSIS — M6281 Muscle weakness (generalized): Secondary | ICD-10-CM | POA: Diagnosis not present

## 2013-07-31 DIAGNOSIS — Z5189 Encounter for other specified aftercare: Secondary | ICD-10-CM | POA: Diagnosis not present

## 2013-07-31 DIAGNOSIS — E1142 Type 2 diabetes mellitus with diabetic polyneuropathy: Secondary | ICD-10-CM | POA: Diagnosis not present

## 2013-07-31 DIAGNOSIS — R269 Unspecified abnormalities of gait and mobility: Secondary | ICD-10-CM | POA: Diagnosis not present

## 2013-07-31 DIAGNOSIS — M6281 Muscle weakness (generalized): Secondary | ICD-10-CM | POA: Diagnosis not present

## 2013-07-31 DIAGNOSIS — E119 Type 2 diabetes mellitus without complications: Secondary | ICD-10-CM | POA: Diagnosis not present

## 2013-08-01 DIAGNOSIS — R269 Unspecified abnormalities of gait and mobility: Secondary | ICD-10-CM | POA: Diagnosis not present

## 2013-08-01 DIAGNOSIS — E119 Type 2 diabetes mellitus without complications: Secondary | ICD-10-CM | POA: Diagnosis not present

## 2013-08-01 DIAGNOSIS — M6281 Muscle weakness (generalized): Secondary | ICD-10-CM | POA: Diagnosis not present

## 2013-08-01 DIAGNOSIS — Z5189 Encounter for other specified aftercare: Secondary | ICD-10-CM | POA: Diagnosis not present

## 2013-08-01 DIAGNOSIS — E1142 Type 2 diabetes mellitus with diabetic polyneuropathy: Secondary | ICD-10-CM | POA: Diagnosis not present

## 2013-08-07 ENCOUNTER — Other Ambulatory Visit: Payer: Self-pay | Admitting: Family Medicine

## 2013-08-08 DIAGNOSIS — Z5189 Encounter for other specified aftercare: Secondary | ICD-10-CM | POA: Diagnosis not present

## 2013-08-08 DIAGNOSIS — M6281 Muscle weakness (generalized): Secondary | ICD-10-CM | POA: Diagnosis not present

## 2013-08-08 DIAGNOSIS — E1142 Type 2 diabetes mellitus with diabetic polyneuropathy: Secondary | ICD-10-CM | POA: Diagnosis not present

## 2013-08-08 DIAGNOSIS — E119 Type 2 diabetes mellitus without complications: Secondary | ICD-10-CM | POA: Diagnosis not present

## 2013-08-08 DIAGNOSIS — R269 Unspecified abnormalities of gait and mobility: Secondary | ICD-10-CM | POA: Diagnosis not present

## 2013-08-11 ENCOUNTER — Telehealth: Payer: Self-pay | Admitting: *Deleted

## 2013-08-11 NOTE — Telephone Encounter (Signed)
[facility] calling, patient needs refill on crestor sent in, will forward to PCP.

## 2013-08-12 ENCOUNTER — Encounter: Payer: Self-pay | Admitting: Family Medicine

## 2013-08-12 ENCOUNTER — Ambulatory Visit (INDEPENDENT_AMBULATORY_CARE_PROVIDER_SITE_OTHER): Payer: Medicare Other | Admitting: Family Medicine

## 2013-08-12 VITALS — BP 147/81 | HR 79 | Ht 59.0 in | Wt 116.0 lb

## 2013-08-12 DIAGNOSIS — M545 Low back pain, unspecified: Secondary | ICD-10-CM | POA: Diagnosis not present

## 2013-08-12 DIAGNOSIS — I10 Essential (primary) hypertension: Secondary | ICD-10-CM | POA: Diagnosis not present

## 2013-08-12 MED ORDER — HYDROCODONE-ACETAMINOPHEN 5-325 MG PO TABS: 1.0000 | ORAL_TABLET | Freq: Every day | ORAL | Status: DC | PRN

## 2013-08-12 MED ORDER — ROSUVASTATIN CALCIUM 20 MG PO TABS: ORAL_TABLET | ORAL | Status: DC

## 2013-08-12 NOTE — Telephone Encounter (Signed)
[facility] crestor [facility] [address]  Marena Chancy, PGY-3 Family Medicine Resident

## 2013-08-13 NOTE — Progress Notes (Signed)
  Subjective:    Patient ID: [name], female    DOB: 1924/07/21, 78 y.o.   MRN: [id number]  HPI  Followup blood pressure. Her home health nurse took her blood pressure at home and thought it was elevated but she does not remember the number. The nurse also thought her ankles were a little more swollen than usual. She thinks they were about the same. She's got no problems with her medicines. She does need some refills. No shortness of breath, no chest pain.  Review of Systems Denies unusual weight change. No fever, sweats, chills.    Objective:   Physical Exam   Vital signs reviewed. GENERAL: Well-developed, well-nourished, no acute distress. CARDIOVASCULAR: Regular rate and rhythm no murmur gallop or rub LUNGS: Clear to auscultation bilaterally, no rales or wheeze. ABDOMEN: Soft positive bowel sounds NEURO: No gross focal neurological deficits. MSK: Movement of extremity x 4. Slight nonpitting edema bilaterally at the ankle.       Assessment & Plan:

## 2013-08-13 NOTE — Assessment & Plan Note (Signed)
She is pretty controlled today. Looking back in her chart she's had a lot of erratic blood pressures. Certainly at her age and not changing thing. She seems to symptomatically and clinically been doing well. I did a couple of refills for her and she'll followup with her regular schedule with her regular PCP. We would certainly be happy see her when necessary in the meantime.

## 2013-08-16 ENCOUNTER — Other Ambulatory Visit: Payer: Self-pay | Admitting: Family Medicine

## 2013-08-26 ENCOUNTER — Other Ambulatory Visit: Payer: Self-pay | Admitting: Family Medicine

## 2013-08-27 IMAGING — CR DG CHEST 1V PORT
1 series · 1 of 1 positions shown · non-contrast
Comparison: 01/20/2013

CLINICAL DATA: Chest tightness/chest pain.

PORTABLE CHEST - 1 VIEW

[AP]
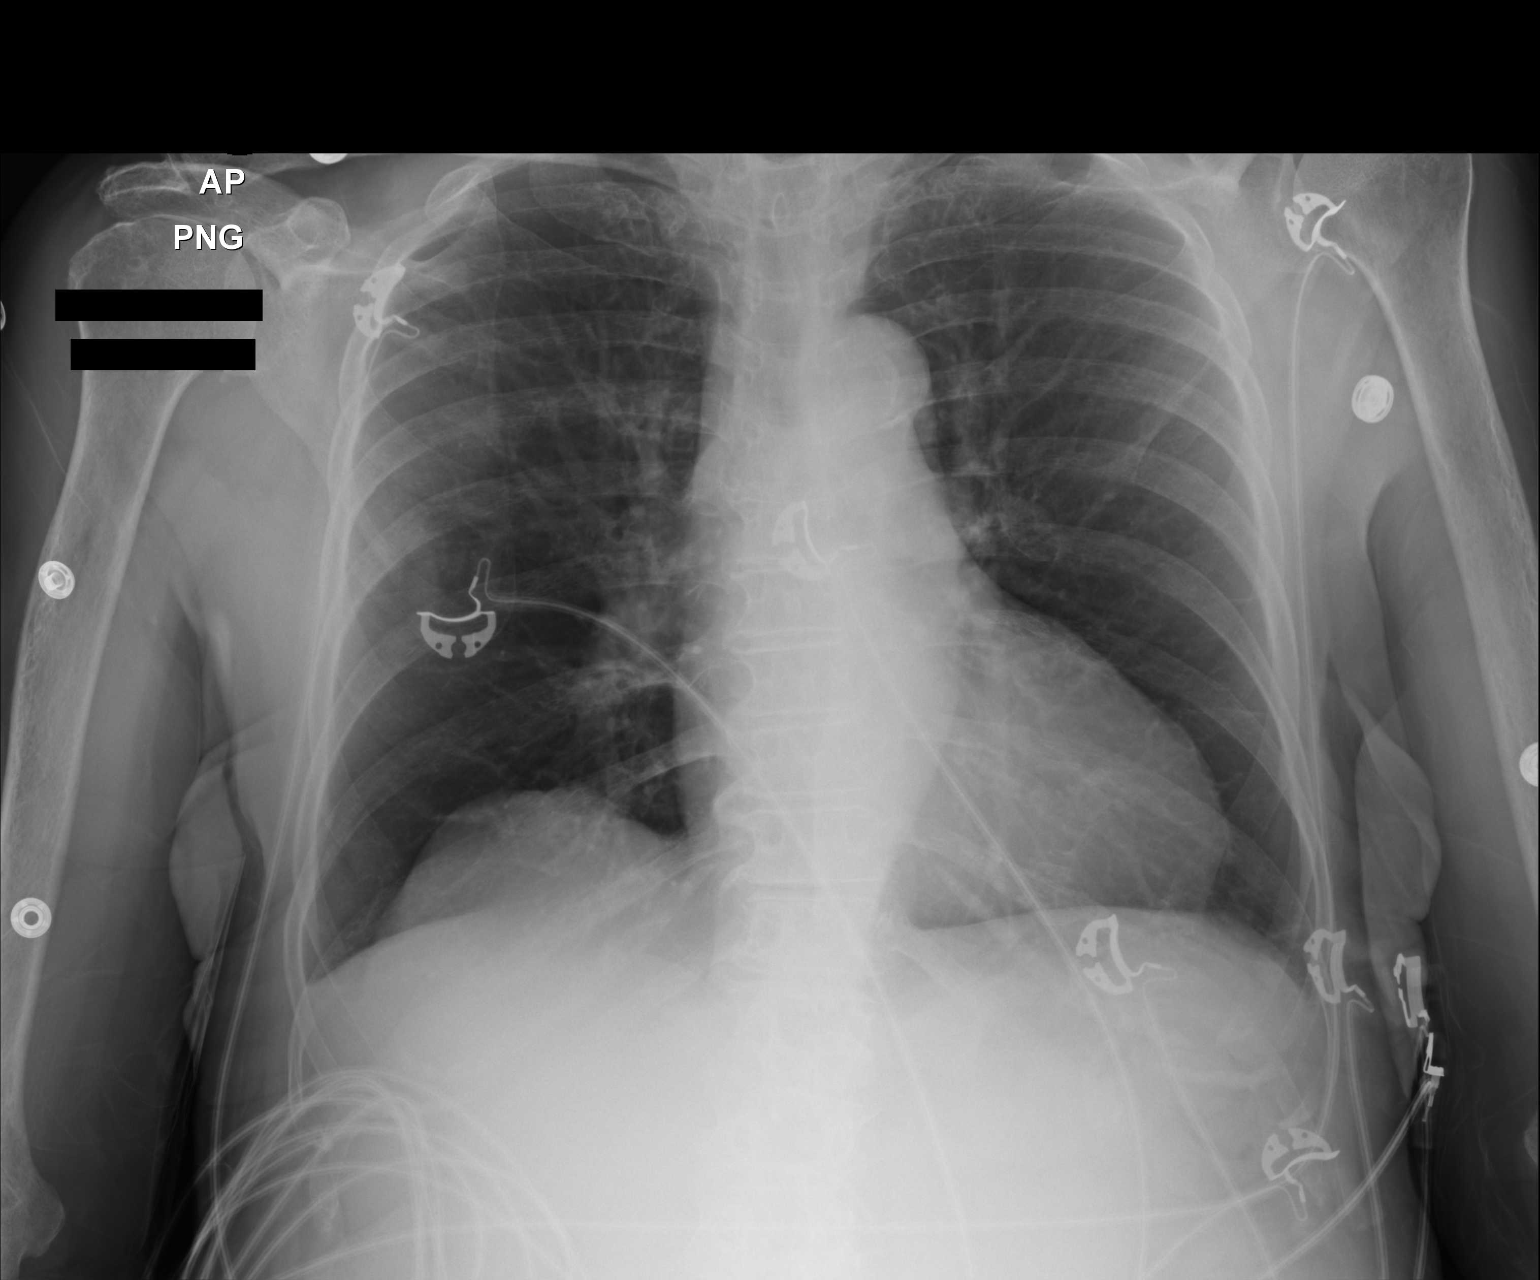

[1 of 1 positions shown; findings below may reference images not displayed]

FINDINGS: Lungs are well inflated and otherwise clear.
Cardiomediastinal silhouette and remainder of the exam is
unchanged.
IMPRESSION: No acute cardiopulmonary disease.

## 2013-09-08 ENCOUNTER — Ambulatory Visit (INDEPENDENT_AMBULATORY_CARE_PROVIDER_SITE_OTHER): Payer: Medicare Other | Admitting: Podiatry

## 2013-09-08 ENCOUNTER — Encounter: Payer: Self-pay | Admitting: Podiatry

## 2013-09-08 ENCOUNTER — Telehealth: Payer: Self-pay | Admitting: Family Medicine

## 2013-09-08 VITALS — BP 172/77 | HR 82 | Resp 24 | Ht 59.0 in | Wt 114.0 lb

## 2013-09-08 DIAGNOSIS — M79609 Pain in unspecified limb: Secondary | ICD-10-CM

## 2013-09-08 DIAGNOSIS — B351 Tinea unguium: Secondary | ICD-10-CM | POA: Diagnosis not present

## 2013-09-08 NOTE — Telephone Encounter (Signed)
NPI given. Pt goes every 3 months for nail trim

## 2013-09-08 NOTE — Progress Notes (Signed)
Patient ID: [name], female   DOB: Aug 26, 1924, 79 y.o.   MRN: [id number]   Subjective: This alert orientated x45 year old black female known diabetic uncomplicated presents complaining of painful toenails. She is a patient in our practice since 2005.  Objective: Hypertrophic, elongated, brown, brittle toenails with palpable tenderness in all nail plates W09.  Assessment: Symptomatic onychomycoses x10  Plan: All nails are debrided back without any bleeding and reappoint at three-month intervals.  [name] C.Leeanne Deed, DPM

## 2013-09-09 ENCOUNTER — Telehealth: Payer: Self-pay | Admitting: Family Medicine

## 2013-09-09 NOTE — Telephone Encounter (Signed)
Please call patient to let her know that the vicodin prescription will be available for her to pick up on 09/10/13, but she will need to be seen in the office before her next refill. The law has changed on vicodin regulation and prescribing it has become more strictly regulated.   Marena Chancy, PGY-2 Family Medicine Resident

## 2013-09-09 NOTE — Telephone Encounter (Signed)
Pt called and would like a refill on her hydrocodone left up front for pick up. JW

## 2013-09-10 ENCOUNTER — Other Ambulatory Visit: Payer: Self-pay | Admitting: Family Medicine

## 2013-09-10 DIAGNOSIS — M545 Low back pain, unspecified: Secondary | ICD-10-CM

## 2013-09-10 MED ORDER — HYDROCODONE-ACETAMINOPHEN 5-325 MG PO TABS: 1.0000 | ORAL_TABLET | Freq: Every day | ORAL | Status: DC | PRN

## 2013-09-10 NOTE — Progress Notes (Signed)
Printed Rx for vicodin 5/325 qd/prn pain 30 tablets, no refills  Marena Chancy, PGY-3 Family Medicine Resident

## 2013-09-10 NOTE — Telephone Encounter (Signed)
Pt notified.  Salvadore Valvano L, CMA  

## 2013-09-19 ENCOUNTER — Emergency Department (HOSPITAL_COMMUNITY): Payer: Medicare Other

## 2013-09-19 ENCOUNTER — Observation Stay (HOSPITAL_COMMUNITY)
Admission: EM | Admit: 2013-09-19 | Discharge: 2013-09-20 | Disposition: A | Discharge status: Home / Self Care | Payer: Medicare Other | Attending: Family Medicine | Admitting: Family Medicine

## 2013-09-19 ENCOUNTER — Encounter: Payer: Self-pay | Admitting: Sports Medicine

## 2013-09-19 ENCOUNTER — Ambulatory Visit (INDEPENDENT_AMBULATORY_CARE_PROVIDER_SITE_OTHER): Payer: Medicare Other | Admitting: Sports Medicine

## 2013-09-19 ENCOUNTER — Other Ambulatory Visit: Payer: Self-pay

## 2013-09-19 ENCOUNTER — Encounter (HOSPITAL_COMMUNITY): Payer: Self-pay | Admitting: Emergency Medicine

## 2013-09-19 ENCOUNTER — Ambulatory Visit (HOSPITAL_COMMUNITY)
Admission: RE | Admit: 2013-09-19 | Discharge: 2013-09-19 | Disposition: A | Discharge status: Home / Self Care | Payer: Medicare Other | Source: Ambulatory Visit | Attending: Family Medicine | Admitting: Family Medicine

## 2013-09-19 VITALS — BP 131/77 | HR 89 | Temp 98.1°F | Ht 59.0 in | Wt 120.0 lb

## 2013-09-19 DIAGNOSIS — I5032 Chronic diastolic (congestive) heart failure: Secondary | ICD-10-CM | POA: Insufficient documentation

## 2013-09-19 DIAGNOSIS — R079 Chest pain, unspecified: Secondary | ICD-10-CM | POA: Diagnosis not present

## 2013-09-19 DIAGNOSIS — R8290 Unspecified abnormal findings in urine: Secondary | ICD-10-CM

## 2013-09-19 DIAGNOSIS — M542 Cervicalgia: Secondary | ICD-10-CM | POA: Diagnosis not present

## 2013-09-19 DIAGNOSIS — M109 Gout, unspecified: Secondary | ICD-10-CM

## 2013-09-19 DIAGNOSIS — I6509 Occlusion and stenosis of unspecified vertebral artery: Secondary | ICD-10-CM | POA: Insufficient documentation

## 2013-09-19 DIAGNOSIS — Z85038 Personal history of other malignant neoplasm of large intestine: Secondary | ICD-10-CM | POA: Diagnosis not present

## 2013-09-19 DIAGNOSIS — G450 Vertebro-basilar artery syndrome: Secondary | ICD-10-CM | POA: Diagnosis not present

## 2013-09-19 DIAGNOSIS — M79609 Pain in unspecified limb: Secondary | ICD-10-CM | POA: Insufficient documentation

## 2013-09-19 DIAGNOSIS — E119 Type 2 diabetes mellitus without complications: Secondary | ICD-10-CM | POA: Diagnosis not present

## 2013-09-19 DIAGNOSIS — E785 Hyperlipidemia, unspecified: Secondary | ICD-10-CM | POA: Diagnosis not present

## 2013-09-19 DIAGNOSIS — R05 Cough: Secondary | ICD-10-CM | POA: Diagnosis not present

## 2013-09-19 DIAGNOSIS — M47812 Spondylosis without myelopathy or radiculopathy, cervical region: Secondary | ICD-10-CM | POA: Diagnosis not present

## 2013-09-19 DIAGNOSIS — I509 Heart failure, unspecified: Secondary | ICD-10-CM | POA: Diagnosis not present

## 2013-09-19 DIAGNOSIS — I658 Occlusion and stenosis of other precerebral arteries: Secondary | ICD-10-CM | POA: Insufficient documentation

## 2013-09-19 DIAGNOSIS — R82998 Other abnormal findings in urine: Secondary | ICD-10-CM

## 2013-09-19 DIAGNOSIS — N390 Urinary tract infection, site not specified: Secondary | ICD-10-CM | POA: Diagnosis not present

## 2013-09-19 DIAGNOSIS — Z853 Personal history of malignant neoplasm of breast: Secondary | ICD-10-CM | POA: Insufficient documentation

## 2013-09-19 DIAGNOSIS — R072 Precordial pain: Secondary | ICD-10-CM | POA: Diagnosis not present

## 2013-09-19 DIAGNOSIS — I10 Essential (primary) hypertension: Secondary | ICD-10-CM | POA: Insufficient documentation

## 2013-09-19 DIAGNOSIS — R5381 Other malaise: Secondary | ICD-10-CM | POA: Diagnosis not present

## 2013-09-19 HISTORY — DX: Aneurysm of carotid artery: I72.0

## 2013-09-19 HISTORY — DX: Other cervical disc degeneration, unspecified cervical region: M50.30

## 2013-09-19 LAB — GLUCOSE, CAPILLARY: Glucose-Capillary: 160 mg/dL — ABNORMAL HIGH (ref 70–99)

## 2013-09-19 LAB — CBC WITH DIFFERENTIAL/PLATELET
Basophils Relative: 0 % (ref 0–1)
Eosinophils Relative: 1 % (ref 0–5)
HCT: 31.8 % — ABNORMAL LOW (ref 36.0–46.0)
Hemoglobin: 10.5 g/dL — ABNORMAL LOW (ref 12.0–15.0)
Lymphocytes Relative: 18 % (ref 12–46)
MCHC: 33 g/dL (ref 30.0–36.0)
Monocytes Relative: 6 % (ref 3–12)
Neutro Abs: 4.6 10*3/uL (ref 1.7–7.7)
Neutrophils Relative %: 75 % (ref 43–77)
RBC: 4.59 MIL/uL (ref 3.87–5.11)
WBC: 6.2 10*3/uL (ref 4.0–10.5)

## 2013-09-19 LAB — TROPONIN I: Troponin I: <0.3 ng/mL (ref <0.30)

## 2013-09-19 LAB — BASIC METABOLIC PANEL
CO2: 24 mEq/L (ref 19–32)
Chloride: 103 mEq/L (ref 96–112)
GFR calc Af Amer: 66 mL/min — ABNORMAL LOW (ref >90)
Glucose, Bld: 185 mg/dL — ABNORMAL HIGH (ref 70–99)
Potassium: 3.7 mEq/L (ref 3.5–5.1)
Sodium: 138 mEq/L (ref 135–145)

## 2013-09-19 LAB — URINALYSIS W MICROSCOPIC + REFLEX CULTURE
Glucose, UA: NEGATIVE mg/dL
Ketones, ur: NEGATIVE mg/dL
Nitrite: NEGATIVE
Protein, ur: NEGATIVE mg/dL
Urobilinogen, UA: 0.2 mg/dL (ref 0.0–1.0)
pH: 6.5 (ref 5.0–8.0)

## 2013-09-19 LAB — POCT URINALYSIS DIPSTICK
Glucose, UA: NEGATIVE
Nitrite, UA: NEGATIVE
Urobilinogen, UA: 0.2

## 2013-09-19 LAB — POCT UA - MICROSCOPIC ONLY

## 2013-09-19 MED ORDER — LOSARTAN POTASSIUM 50 MG PO TABS
50.0000 mg | ORAL_TABLET | Freq: Every day | ORAL | Status: DC
  Administered 2013-09-19 – 2013-09-20 (×2): 50 mg via ORAL
  Filled 2013-09-19 (×4): qty 1

## 2013-09-19 MED ORDER — HYDROCODONE-ACETAMINOPHEN 5-325 MG PO TABS
1.0000 | ORAL_TABLET | Freq: Every day | ORAL | Status: DC | PRN
  Filled 2013-09-19: qty 1

## 2013-09-19 MED ORDER — ASPIRIN 81 MG PO CHEW
81.0000 mg | CHEWABLE_TABLET | Freq: Every day | ORAL | Status: DC
  Administered 2013-09-19 – 2013-09-20 (×2): 81 mg via ORAL
  Filled 2013-09-19 (×2): qty 1

## 2013-09-19 MED ORDER — MIRTAZAPINE 15 MG PO TABS
15.0000 mg | ORAL_TABLET | Freq: Every day | ORAL | Status: DC
  Administered 2013-09-19: 22:00:00 15 mg via ORAL
  Filled 2013-09-19 (×4): qty 1

## 2013-09-19 MED ORDER — ATORVASTATIN CALCIUM 40 MG PO TABS
40.0000 mg | ORAL_TABLET | Freq: Every day | ORAL | Status: DC
  Filled 2013-09-19: qty 1

## 2013-09-19 MED ORDER — AMLODIPINE BESYLATE 10 MG PO TABS
10.0000 mg | ORAL_TABLET | Freq: Every day | ORAL | Status: DC
  Administered 2013-09-19 – 2013-09-20 (×2): 10 mg via ORAL
  Filled 2013-09-19 (×3): qty 1

## 2013-09-19 MED ORDER — FAMOTIDINE 20 MG PO TABS
20.0000 mg | ORAL_TABLET | Freq: Every day | ORAL | Status: DC
  Administered 2013-09-19 – 2013-09-20 (×2): 20 mg via ORAL
  Filled 2013-09-19 (×3): qty 1

## 2013-09-19 MED ORDER — DEXTROSE 5 % IV SOLN
1.0000 g | Freq: Once | INTRAVENOUS | Status: AC
  Administered 2013-09-19: 1 g via INTRAVENOUS
  Filled 2013-09-19: qty 10

## 2013-09-19 MED ORDER — HEPARIN SODIUM (PORCINE) 5000 UNIT/ML IJ SOLN
5000.0000 [IU] | Freq: Three times a day (TID) | INTRAMUSCULAR | Status: DC
  Administered 2013-09-19 – 2013-09-20 (×2): 5000 [IU] via SUBCUTANEOUS
  Filled 2013-09-19 (×6): qty 1

## 2013-09-19 MED ORDER — IOHEXOL 350 MG/ML SOLN
50.0000 mL | Freq: Once | INTRAVENOUS | Status: AC | PRN
  Administered 2013-09-19: 50 mL via INTRAVENOUS

## 2013-09-19 MED ORDER — ALBUTEROL SULFATE HFA 108 (90 BASE) MCG/ACT IN AERS
2.0000 | INHALATION_SPRAY | Freq: Four times a day (QID) | RESPIRATORY_TRACT | Status: DC | PRN
  Filled 2013-09-19: qty 6.7

## 2013-09-19 MED ORDER — OLOPATADINE HCL 0.1 % OP SOLN
1.0000 [drp] | Freq: Two times a day (BID) | OPHTHALMIC | Status: DC
  Administered 2013-09-19 – 2013-09-20 (×2): 1 [drp] via OPHTHALMIC
  Filled 2013-09-19 (×2): qty 5

## 2013-09-19 MED ORDER — TAMOXIFEN CITRATE 10 MG PO TABS
20.0000 mg | ORAL_TABLET | Freq: Every day | ORAL | Status: DC
  Administered 2013-09-20: 20 mg via ORAL
  Filled 2013-09-19 (×2): qty 2

## 2013-09-19 MED ORDER — CARVEDILOL 12.5 MG PO TABS
12.5000 mg | ORAL_TABLET | Freq: Two times a day (BID) | ORAL | Status: DC
  Administered 2013-09-19 – 2013-09-20 (×2): 12.5 mg via ORAL
  Filled 2013-09-19 (×6): qty 1

## 2013-09-19 NOTE — Assessment & Plan Note (Addendum)
Will be reevaluated today with CTA in the emergency department.

## 2013-09-19 NOTE — Assessment & Plan Note (Addendum)
Patient with a 5 day onset of bilateral skull base neck pain.  Pain with any type of motion.  She has significant shortness of breath with exertion, associated chest pain and I am unable to appreciate a right radial pulse today.  EKG is unchanged from prior.  I have concerns that she has had abnormal vertebral and carotid arteries with known aneurysm on CTA of the neck in 2011.  I am concerned this may represent a potential dissection of the vertebral artery or arteries.  Although the etiology is statistically muscular skeletal in nature, the sense of impending doom that she has right now would suggest that this may be something more than just a musculoskeletal etiology.   I recommend emergent further evaluation of her neck vessels be including a CT angiogram of the head and neck through the emergency department.  I have discussed EDP on [facility] and they are expecting her.  I have highly recommended the patient to be transported by EMS however she is resistant to go to the emergency department either way and is insistent on going home but is agreeable to self transport.  324 mg of aspirin provided, prior to transport to ED with family members.

## 2013-09-19 NOTE — Assessment & Plan Note (Signed)
Atypical chest pain.  Episodes lasting approximately one one hour with each episode.  There is no exertional component.  She does have a sense of impending doom.  324 mg of aspirin given today prior to transport.  See neck pain section for further discussion as to indication for emergency room evaluation.

## 2013-09-19 NOTE — H&P (Signed)
Family Medicine Teaching [facility] Admission History and Physical Service Pager: [phone]  [name] Medical record number: [id number] Date of birth: Jun 11, 1924 Age: 79 y.o. Gender: female  Primary Care Provider: Marena Chancy, MD Consultants: none Code Status: DNR  Chief Complaint: Neck pain  Assessment and Plan: [name] is a 79 y.o. female presenting with right neck pain and fatigue and weakness and impending sense of doom. PMH is significant for colon cancer and breast cancer, diabetes, chronic diastolic heart failure and CAD. CT did show evidence of spondylosis which could also explain pain and spasms.   # Neck Pain: CTA ruled out aortic dissection which we were concerned about. This is likely from muscle spasms.  - vicodin for pain in acute setting - would prefer not using muscle relaxer in elderly female.  - hot pad - PT/OT for identification of exercises  # Weakness: was admitted for this in the past. Seems also to be associated with depression and sense of doom. Unclear what the prompt for this is. TSH recently checked which was normal on 07/07/13.  - PT/OT for mobilization - continue remeron, consider adding SSRI at some point  # h/o CAD and diastolic heart failure: - resume home meds - obtain troponin x1 - check pro BNP for occult worsening of heart failure could also explain her symptoms of doom and death  # h/o breast cancer: s/p bilateral simple mastectomy - continue tamoxifen  # DM2: last A1C: 7.7 in 05/2013. On metformin - goal A1C<8 - CBG monitoring to rule out hypoglycemia - hold metformin and start ISS   FEN/GI: Carved modified diet Prophylaxis: Heparin  Disposition: Admit for observation. Likely discharge home tomorrow pending improvement.  History of Present Illness: [name] is a 79 y.o. female presenting with right-sided neck pain. She was initially evaluated at the family medicine clinic this morning. She has a  history of right cavernous carotid aneurysm and in this context, Dr. Berline Chough, the resident who saw her, was worried about carotid artery dissection. She was directed to the emergency room to get a CT of her neck. CT was done and was unremarkable. However she continued endorsing weakness and was admitted to the hospital. She states that in the next pain started Sunday, it is worse with movement, better with heat. She tried Tylenol which did not help. It is a sensation of pulling when she moves her neck. Pain is located mostly in the right side. Not associated with blurred vision or headache. No focal weakness. She reports weakness for last month. She also has some dyspnea with walking and this has been ongoing for many months. She denies any fevers or chills. She reports some mild congestion in her nose. She reports that she has an occasional cough that produces white sputum. She denies any abdominal pain, dysuria, polyuria. She had an episode of diarrhea which lasted from Friday to Tuesday. Bowel movements were liquid, nonbloody and have since resolved. She denies any chest pain. She does say that she is ready to pass on. She states that she saw her deceased sister last week who told her that she was bringing her with her. Since then she has not felt well. She denies any SI/HI.  Review Of Systems: Per HPI with the following additions: None Otherwise 12 point review of systems was performed and was unremarkable.  Patient Active Problem List   Diagnosis Date Noted  . History of fall 05/15/2013    Priority: High  .  DIABETES MELLITUS II, UNCOMPLICATED 01/10/2007    Priority: High  . CORONARY, ARTERIOSCLEROSIS 01/10/2007    Priority: High  . Chronic systolic heart failure 01/10/2007    Priority: High  . Right thigh pain 05/15/2013    Priority: Medium  . Other malaise and fatigue 03/22/2011    Priority: Medium  . ANEMIA-IRON DEFICIENCY 04/29/2007    Priority: Medium  . Chest pain, unspecified  09/19/2013  . Bilateral neck pain 09/19/2013  . UTI (urinary tract infection) 07/20/2013  . Light headed 06/16/2013  . Memory deficit 05/15/2013  . Bilateral leg pain 03/07/2013  . Trigger thumb of right hand 03/07/2013  . Lower extremity edema 02/19/2013  . Weakness generalized 01/22/2013  . Stricture and stenosis of esophagus 10/27/2012  . Food impaction of esophagus 10/27/2012  . Gout attack 09/13/2012  . Poor social situation 09/13/2012  . Low back pain 08/07/2012  . Musculoskeletal chest pain 07/10/2012  . Thoracic back pain 05/24/2012  . Weight loss 04/03/2012  . Insomnia 09/25/2011  . Urinary frequency 06/02/2011  . Tobacco abuse 06/02/2011  . History of breast cancer in female, bilateral.  Mastectomies 10/24/2010. 04/26/2011  . Hiatal hernia 03/22/2011  . Colon polyp 03/22/2011  . Poor circulation 03/22/2011  . 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 NEUROPATHY 08/25/2009  . HYPERLIPIDEMIA 01/29/2007  . HYPERTENSION, BENIGN SYSTEMIC 01/10/2007  . ASTHMA, PERSISTENT 01/10/2007  . Reflux esophagitis 01/10/2007  . ARTHRITIS 01/10/2007   Past Medical History: Past Medical History  Diagnosis Date  . Breast cancer 10/2010    Invasive Ductal Carcinoma, s/p bilateral mastectomy, now on Tamoxifen. Followed by Dr Dalene Carrow.   . Diabetes mellitus   . Hypertension   . Hyperlipidemia   . Anemia     Iron def anemia  . CAD (coronary artery disease)   . CHF (congestive heart failure)     Systolic   . History of colon cancer   . Neuropathy   . Allergy   . GERD (gastroesophageal reflux disease)   . Hiatal hernia   . Diverticulosis   . Asthma   . Arthritis   . H/O: GI bleed   . Breast cancer 2011    s/p Bilateral masectomy  . Shortness of breath   . Pneumonia 03/2012  . Carotid artery aneurysm 08/2010    right ICA, 5 x 5mm  . DDD (degenerative disc disease), cervical    Past Surgical History: Past Surgical History  Procedure Laterality Date  . Breast masectomy   10/2010    Bilateral masectomy by Dr Ezzard Standing s/p invasive ductal carcinoma.  . Cardiac  catherization  2007    Severe 3-vessel disease.  EF 20-25%.  . Esophagogastroduodenoscopy  2001    Esophageal Tear  . Total knee arthroplasty  2001  . Esophagogastroduodenoscopy  10/27/2012    Procedure: ESOPHAGOGASTRODUODENOSCOPY (EGD);  Surgeon: Hilarie Fredrickson, MD;  Location: [facility] ENDOSCOPY;  Service: Endoscopy;  Laterality: N/A;  pat  . Breast surgery     Social History: History  Substance Use Topics  . Smoking status: Never Smoker   . Smokeless tobacco: Current User    Types: Snuff  . Alcohol Use: No   Additional social history: Lives alone, has a nurse's aide who comes every day to help her with her medicines. Has 2 daughters who also live in [place].  Please also refer to relevant sections of EMR.  Family History: Family History  Problem Relation Age of Onset  . Sickle cell anemia Other   . Heart disease Mother   .  Heart disease Father    Allergies and Medications: Allergies  Allergen Reactions  . Peanuts [Nuts] Swelling  . Lisinopril Cough   No current facility-administered medications on file prior to encounter.   Current Outpatient Prescriptions on File Prior to Encounter  Medication Sig Dispense Refill  . albuterol (PROVENTIL HFA;VENTOLIN HFA) 108 (90 BASE) MCG/ACT inhaler Inhale 2 puffs into the lungs every 6 (six) hours as needed. For shortness of breath  1 Inhaler  6  . amLODipine (NORVASC) 10 MG tablet Take 1 tablet (10 mg total) by mouth daily.  30 tablet  3  . aspirin (ASPIRIN CHILDRENS) 81 MG chewable tablet Chew 81 mg by mouth daily.        . carvedilol (COREG) 12.5 MG tablet Take 1 tablet (12.5 mg total) by mouth 2 (two) times daily with a meal.  60 tablet  3  . colchicine 0.6 MG tablet Take 1 tablet (0.6 mg total) by mouth once. Take 2 tablets on 1 day, if still having pain in an hour, take 1 more tablet on day 1. After that take 2 tablets daily for 2 weeks then 1  tablet daily. See [place] back in 1 week if not improving or if you develop fevers.  60 tablet  0  . HYDROcodone-acetaminophen (NORCO/VICODIN) 5-325 MG per tablet Take 1 tablet by mouth daily as needed for pain.  30 tablet  0  . losartan (COZAAR) 50 MG tablet Take 1 tablet (50 mg total) by mouth daily.  30 tablet  4  . metFORMIN (GLUCOPHAGE) 500 MG tablet Take 1 tablet (500 mg total) by mouth 2 (two) times daily with a meal.  60 tablet  5  . mirtazapine (REMERON) 15 MG tablet Take 1 tablet (15 mg total) by mouth at bedtime.  30 tablet  3  . PATADAY 0.2 % SOLN Place 1 drop into both eyes daily as needed. For itchy eyes      . [DISCONTINUED] cetirizine (ZYRTEC) 5 MG tablet Take 1 tablet (5 mg total) by mouth daily. Take at night time as it can cause some drowsiness  30 tablet  4    Objective: BP 132/66  Pulse 82  Temp(Src) 98.4 F (36.9 C) (Oral)  Resp 21  Ht 4\' 11"  (1.499 m)  Wt 114 lb (51.71 kg)  BMI 23.01 kg/m2  SpO2 100% Exam: General: Fatigue, more disheveled than I have seen her before, no acute distress, alert and oriented x3. HEENT: Dry mucous membranes, pupils equal round and reactive to light and accommodation, oropharynx clear with dentures in place. Cardiovascular: S1-S2, regular rate and rhythm, no murmur appreciated Respiratory: Normal work of breathing, clear to auscultation bilaterally Abdomen: Soft, nontender, nondistended Extremities: No significant edema Skin: Normal, no rash Neuro: Cranial nerves II through XII grossly intact,5 upper and lower extremity strength bilaterally. Neck: Exquisitely tender along her right trapezius muscle which has a significant muscle spasm, some tenderness along the cervical spine, no tenderness along the left side of the neck.  Labs and Imaging: CBC BMET   Recent Labs Lab 09/19/13 1155  WBC 6.2  HGB 10.5*  HCT 31.8*  PLT 209    Recent Labs Lab 09/19/13 1155  NA 138  K 3.7  CL 103  CO2 24  BUN 15  CREATININE 0.89  GLUCOSE  185*  CALCIUM 10.7*     EXAM: 09/19/13 CT ANGIOGRAPHY NECK  TECHNIQUE:  Multidetector CT imaging of the neck was performed using the  standard protocol during bolus administration of intravenous  contrast. Multiplanar CT image reconstructions including MIPs were  obtained to evaluate the vascular anatomy. Carotid stenosis  measurements (when applicable) are obtained utilizing NASCET  criteria, using the distal internal carotid diameter as the  denominator.  CONTRAST: 50mL OMNIPAQUE IOHEXOL 350 MG/ML SOLN  COMPARISON: CT cervical spine from earlier today.  FINDINGS:  Cervical spine findings are reported separately.  3 vessel aortic arch. Mild atherosclerotic disease in the aortic  arch. Proximal great vessels are patent without significant  stenosis.  Right carotid: The right common carotid artery is widely patent.  There is atherosclerotic disease at the carotid bifurcation with  calcified and noncalcified plaque. No significant stenosis or  dissection.  Left carotid: Atherosclerotic disease is present in the mid left  common carotid artery with calcified plaque but no significant  stenosis. Atherosclerotic calcification is present in the carotid  bulb on the left without significant stenosis. Carotid bifurcation  is widely patent. Negative for dissection. Atherosclerotic  calcification is present in the cavernous carotid bilaterally.  Vertebral arteries: Left vertebral artery is dominant. 75% stenosis  at the origin of the left vertebral artery. Scattered  atherosclerotic disease is present throughout the left vertebral  artery with multiple areas of 50% diameter stenosis. Extensive  atherosclerotic disease in the distal left vertebral artery with 50%  diameter stenosis over a long segment. Negative for vertebral artery  dissection. Right vertebral artery is nondominant with mild 50%  stenosis at the origin. Negative for dissection.  Review of the MIP images confirms the above  findings.  IMPRESSION:  Diffuse atherosclerotic disease as above. No hemodynamically  significant carotid stenosis. There is bilateral vertebral stenosis  left greater than right with a 75% stenosis at the origin of the  left vertebral artery and extensive calcification involving the  distal left vertebral artery.  Negative for carotid or vertebral artery dissection.   Lonia Skinner, MD 09/19/2013, 5:53 PM PGY-3, [facility] FPTS Intern pager: [phone], text pages welcome

## 2013-09-19 NOTE — ED Provider Notes (Signed)
CSN: [id number]     Arrival date & time 09/19/13  1100 History   First MD Initiated Contact with Patient 09/19/13 1120     Chief Complaint  Patient presents with  . Neck Pain    HPI Pt was seen at 1120. Per pt and family, c/o gradual onset and persistence of constant neck pain for the past 5 days. Pt states the pain started on the left side of her neck, now has "moved" to the right side of her neck. Describes the pain as a "muscle cramp" and "grabbing," worsens with moving her head side to side and palpation of the area. Pt also c/o gradual onset and persistence of multiple intermittent episodes of mid-sternal chest "pain" that began yesterday. Describes the chest pain as "heaviness," occurs with ambulation, improves with rest, and is associated with SOB. Denies palpitations, no back pain, no abd pain, no N/V/D, no cough, no fevers, no rash, no injury, no headache, no visual changes, no focal motor weakness, no tingling/numbness in extremities, no ataxia, no slurred speech, no facial droop. Pt was evaluated by her PMD PTA, and sent to the ED for further evaluation and admission.    Past Medical History  Diagnosis Date  . Breast cancer 10/2010    Invasive Ductal Carcinoma, s/p bilateral mastectomy, now on Tamoxifen. Followed by Dr Dalene Carrow.   . Diabetes mellitus   . Hypertension   . Hyperlipidemia   . Anemia     Iron def anemia  . CAD (coronary artery disease)   . CHF (congestive heart failure)     Systolic   . History of colon cancer   . Neuropathy   . Allergy   . GERD (gastroesophageal reflux disease)   . Hiatal hernia   . Diverticulosis   . Asthma   . Arthritis   . H/O: GI bleed   . Breast cancer 2011    s/p Bilateral masectomy  . Shortness of breath   . Pneumonia 03/2012  . Carotid artery aneurysm 08/2010    right ICA, 5 x 5mm  . DDD (degenerative disc disease), cervical    Past Surgical History  Procedure Laterality Date  . Breast masectomy  10/2010    Bilateral masectomy  by Dr Ezzard Standing s/p invasive ductal carcinoma.  . Cardiac  catherization  2007    Severe 3-vessel disease.  EF 20-25%.  . Esophagogastroduodenoscopy  2001    Esophageal Tear  . Total knee arthroplasty  2001  . Esophagogastroduodenoscopy  10/27/2012    Procedure: ESOPHAGOGASTRODUODENOSCOPY (EGD);  Surgeon: Hilarie Fredrickson, MD;  Location: [facility] ENDOSCOPY;  Service: Endoscopy;  Laterality: N/A;  pat  . Breast surgery     Family History  Problem Relation Age of Onset  . Sickle cell anemia Other   . Heart disease Mother   . Heart disease Father    History  Substance Use Topics  . Smoking status: Never Smoker   . Smokeless tobacco: Current User    Types: Snuff  . Alcohol Use: No    Review of Systems ROS: Statement: All systems negative except as marked or noted in the HPI; Constitutional: Negative for fever and chills. ; ; Eyes: Negative for eye pain, redness and discharge. ; ; ENMT: Negative for ear pain, hoarseness, nasal congestion, sinus pressure and sore throat. ; ; Cardiovascular: +CP, SOB. Negative for palpitations, diaphoresis, and peripheral edema. ; ; Respiratory: Negative for cough, wheezing and stridor. ; ; Gastrointestinal: Negative for nausea, vomiting, diarrhea, abdominal pain, blood in stool,  hematemesis, jaundice and rectal bleeding. . ; ; Genitourinary: Negative for dysuria, flank pain and hematuria. ; ; Musculoskeletal: +neck pain. Negative for back pain. Negative for swelling and trauma.; ; Skin: Negative for pruritus, rash, abrasions, blisters, bruising and skin lesion.; ; Neuro: Negative for headache, lightheadedness and neck stiffness. Negative for weakness, altered level of consciousness , altered mental status, extremity weakness, paresthesias, involuntary movement, seizure and syncope.      Allergies  Peanuts and Lisinopril  Home Medications   Current Outpatient Rx  Name  Route  Sig  Dispense  Refill  . albuterol (PROVENTIL HFA;VENTOLIN HFA) 108 (90 BASE) MCG/ACT  inhaler   Inhalation   Inhale 2 puffs into the lungs every 6 (six) hours as needed. For shortness of breath   1 Inhaler   6   . amLODipine (NORVASC) 10 MG tablet   Oral   Take 1 tablet (10 mg total) by mouth daily.   30 tablet   3   . aspirin (ASPIRIN CHILDRENS) 81 MG chewable tablet   Oral   Chew 81 mg by mouth daily.           . carvedilol (COREG) 12.5 MG tablet   Oral   Take 1 tablet (12.5 mg total) by mouth 2 (two) times daily with a meal.   60 tablet   3   . colchicine 0.6 MG tablet   Oral   Take 1 tablet (0.6 mg total) by mouth once. Take 2 tablets on 1 day, if still having pain in an hour, take 1 more tablet on day 1. After that take 2 tablets daily for 2 weeks then 1 tablet daily. See [place] back in 1 week if not improving or if you develop fevers.   60 tablet   0   . HYDROcodone-acetaminophen (NORCO/VICODIN) 5-325 MG per tablet   Oral   Take 1 tablet by mouth daily as needed for pain.   30 tablet   0   . losartan (COZAAR) 50 MG tablet   Oral   Take 1 tablet (50 mg total) by mouth daily.   30 tablet   4   . magnesium hydroxide (MILK OF MAGNESIA) 400 MG/5ML suspension   Oral   Take 30 mLs by mouth daily as needed for mild constipation.         . metFORMIN (GLUCOPHAGE) 500 MG tablet   Oral   Take 1 tablet (500 mg total) by mouth 2 (two) times daily with a meal.   60 tablet   5   . mirtazapine (REMERON) 15 MG tablet   Oral   Take 1 tablet (15 mg total) by mouth at bedtime.   30 tablet   3   . PATADAY 0.2 % SOLN   Both Eyes   Place 1 drop into both eyes daily as needed. For itchy eyes         . ranitidine (ZANTAC) 150 MG capsule   Oral   Take 150 mg by mouth 2 (two) times daily as needed for heartburn.         . rosuvastatin (CRESTOR) 20 MG tablet   Oral   Take 20 mg by mouth daily.         . tamoxifen (NOLVADEX) 20 MG tablet   Oral   Take 20 mg by mouth daily.          BP 168/59  Pulse 77  Temp(Src) 98.4 F (36.9 C) (Oral)   Resp 15  Ht 4\' 11"  (  1.499 m)  Wt 114 lb (51.71 kg)  BMI 23.01 kg/m2  SpO2 100% Physical Exam 1125: Physical examination:  Nursing notes reviewed; Vital signs and O2 SAT reviewed;  Constitutional: Well developed, Well nourished, Well hydrated, In no acute distress; Head:  Normocephalic, atraumatic; Eyes: EOMI, PERRL, No scleral icterus; ENMT: Mouth and pharynx normal, Mucous membranes moist; Neck: Supple, Full range of motion, No lymphadenopathy; Cardiovascular: Regular rate and rhythm, No gallop; Respiratory: Breath sounds clear & equal bilaterally, No wheezes.  Speaking full sentences with ease, Normal respiratory effort/excursion; Chest: Nontender, Movement normal; Abdomen: Soft, Nontender, Nondistended, Normal bowel sounds; Genitourinary: No CVA tenderness; Spine:  No midline CS, TS, LS tenderness. +TTP right hypertonic trapezius muscle which reproduces pt's pain.;; Extremities: Palp peripheral pulses x4 extremities. No tenderness, No edema, No calf edema or asymmetry.; Neuro: AA&Ox3, Major CN grossly intact.  Strength 5/5 equal bilat UE's and LE's.  DTR 2/4 equal bilat UE's and LE's.  No gross sensory deficits.  Normal cerebellar testing bilat UE's (finger-nose) and LE's (heel-shin). Speech clear.  No facial droop.  No nystagmus.;; Skin: Color normal, Warm, Dry.; Psych:  Affect flat, poor eye contact.    ED Course  Procedures    EKG Interpretation     Ventricular Rate:  88 PR Interval:  156 QRS Duration: 72 QT Interval:  366 QTC Calculation: 442 R Axis:   -10 Text Interpretation:  Normal sinus rhythm Possible Left atrial enlargement Nonspecific ST and T wave abnormality Abnormal ECG            MDM  MDM Reviewed: previous chart, nursing note and vitals Reviewed previous: labs, ECG, CT scan and x-ray Interpretation: labs, ECG, x-ray and CT scan    Date: 09/19/2013  Rate: 88  Rhythm: normal sinus rhythm  QRS Axis: left  Intervals: normal  ST/T Wave abnormalities:  nonspecific ST/T changes  Conduction Disutrbances:none  Narrative Interpretation:   Old EKG Reviewed: changes noted; NS STTW changes new since previous EKG dated 07/07/2013.   Results for orders placed during the hospital encounter of 09/19/13  CBC WITH DIFFERENTIAL      Result Value Range   WBC 6.2  4.0 - 10.5 K/uL   RBC 4.59  3.87 - 5.11 MIL/uL   Hemoglobin 10.5 (*) 12.0 - 15.0 g/dL   HCT 16.1 (*) 09.6 - 04.5 %   MCV 69.3 (*) 78.0 - 100.0 fL   MCH 22.9 (*) 26.0 - 34.0 pg   MCHC 33.0  30.0 - 36.0 g/dL   RDW 40.9 (*) 81.1 - 91.4 %   Platelets 209  150 - 400 K/uL   Neutrophils Relative % 75  43 - 77 %   Lymphocytes Relative 18  12 - 46 %   Monocytes Relative 6  3 - 12 %   Eosinophils Relative 1  0 - 5 %   Basophils Relative 0  0 - 1 %   Neutro Abs 4.6  1.7 - 7.7 K/uL   Lymphs Abs 1.1  0.7 - 4.0 K/uL   Monocytes Absolute 0.4  0.1 - 1.0 K/uL   Eosinophils Absolute 0.1  0.0 - 0.7 K/uL   Basophils Absolute 0.0  0.0 - 0.1 K/uL   RBC Morphology ELLIPTOCYTES     Smear Review LARGE PLATELETS PRESENT    BASIC METABOLIC PANEL      Result Value Range   Sodium 138  135 - 145 mEq/L   Potassium 3.7  3.5 - 5.1 mEq/L   Chloride 103  96 -  112 mEq/L   CO2 24  19 - 32 mEq/L   Glucose, Bld 185 (*) 70 - 99 mg/dL   BUN 15  6 - 23 mg/dL   Creatinine, Ser 1.61  0.50 - 1.10 mg/dL   Calcium 09.6 (*) 8.4 - 10.5 mg/dL   GFR calc non Af Amer 57 (*) >90 mL/min   GFR calc Af Amer 66 (*) >90 mL/min  TROPONIN I      Result Value Range   Troponin I <0.30  <0.30 ng/mL  URINALYSIS W MICROSCOPIC + REFLEX CULTURE      Result Value Range   Color, Urine YELLOW  YELLOW   APPearance HAZY (*) CLEAR   Specific Gravity, Urine 1.012  1.005 - 1.030   pH 6.5  5.0 - 8.0   Glucose, UA NEGATIVE  NEGATIVE mg/dL   Hgb urine dipstick NEGATIVE  NEGATIVE   Bilirubin Urine NEGATIVE  NEGATIVE   Ketones, ur NEGATIVE  NEGATIVE mg/dL   Protein, ur NEGATIVE  NEGATIVE mg/dL   Urobilinogen, UA 0.2  0.0 - 1.0 mg/dL   Nitrite  NEGATIVE  NEGATIVE   Leukocytes, UA MODERATE (*) NEGATIVE   WBC, UA 21-50  <3 WBC/hpf   RBC / HPF 0-2  <3 RBC/hpf   Bacteria, UA MANY (*) RARE   Squamous Epithelial / LPF FEW (*) RARE   Dg Chest 2 View 09/19/2013   CLINICAL DATA:  Neck pain.  Productive cough.  EXAM: CHEST  2 VIEW  COMPARISON:  Multiple priors  FINDINGS: The cardiomediastinal silhouette is unchanged. No focal infiltrate or edema. No pleural effusion or pneumothorax. No acute osseous abnormality.  IMPRESSION: No active cardiopulmonary disease.   Electronically Signed   By: Jerene Dilling M.D.   On: 09/19/2013 13:23   Ct Angio Neck W/cm &/or Wo/cm 09/19/2013   CLINICAL DATA:  Neck pain while turning head. No trauma. Rule out dissection.  EXAM: CT ANGIOGRAPHY NECK  TECHNIQUE: Multidetector CT imaging of the neck was performed using the standard protocol during bolus administration of intravenous contrast. Multiplanar CT image reconstructions including MIPs were obtained to evaluate the vascular anatomy. Carotid stenosis measurements (when applicable) are obtained utilizing NASCET criteria, using the distal internal carotid diameter as the denominator.  CONTRAST:  50mL OMNIPAQUE IOHEXOL 350 MG/ML SOLN  COMPARISON:  CT cervical spine from earlier today.  FINDINGS: Cervical spine findings are reported separately.  3 vessel aortic arch. Mild atherosclerotic disease in the aortic arch. Proximal great vessels are patent without significant stenosis.  Right carotid: The right common carotid artery is widely patent. There is atherosclerotic disease at the carotid bifurcation with calcified and noncalcified plaque. No significant stenosis or dissection.  Left carotid: Atherosclerotic disease is present in the mid left common carotid artery with calcified plaque but no significant stenosis. Atherosclerotic calcification is present in the carotid bulb on the left without significant stenosis. Carotid bifurcation is widely patent. Negative for  dissection. Atherosclerotic calcification is present in the cavernous carotid bilaterally.  Vertebral arteries: Left vertebral artery is dominant. 75% stenosis at the origin of the left vertebral artery. Scattered atherosclerotic disease is present throughout the left vertebral artery with multiple areas of 50% diameter stenosis. Extensive atherosclerotic disease in the distal left vertebral artery with 50% diameter stenosis over a long segment. Negative for vertebral artery dissection. Right vertebral artery is nondominant with mild 50% stenosis at the origin. Negative for dissection.  Review of the MIP images confirms the above findings.  IMPRESSION: Diffuse atherosclerotic disease as above.  No hemodynamically significant carotid stenosis. There is bilateral vertebral stenosis left greater than right with a 75% stenosis at the origin of the left vertebral artery and extensive calcification involving the distal left vertebral artery.  Negative for carotid or vertebral artery dissection.   Electronically Signed   By: Marlan Palau M.D.   On: 09/19/2013 16:30   Ct Cervical Spine Wo Contrast 09/19/2013   CLINICAL DATA:  Neck pain. Rule out dissection  EXAM: CT CERVICAL SPINE WITHOUT CONTRAST  TECHNIQUE: Multidetector CT imaging of the cervical spine was performed without intravenous contrast. Multiplanar CT image reconstructions were also generated.  COMPARISON:  CT angiogram neck performed today. Intravenous contrast was given for that study. See separate CT angiogram report for vascular findings.  FINDINGS: Cervical spondylosis at multiple levels. Negative for fracture or mass lesion.  Thyroid nodules are present bilaterally. The largest nodule on the right measures 10 x 16 mm. Largest nodule on the left measures 5 x 12 mm.  C2-3: Mild disc degeneration with early spurring  C3-4: Disc degeneration with spondylosis. Mild right foraminal narrowing.  C4-5: Disc degeneration and spondylosis causing moderate foraminal  encroachment bilaterally and mild spinal stenosis  C5-6: Disc degeneration and spondylosis. Mild spinal stenosis and mild foraminal encroachment bilaterally  C6-7: Mild disc degeneration. Mild anterior slip measuring 1 mm. No significant stenosis  C7-T1: Negative  IMPRESSION: Mild to moderate cervical spondylosis. Negative for fracture or mass. No acute bony abnormality.   Electronically Signed   By: Marlan Palau M.D.   On: 09/19/2013 16:20    Results for JAEDYN, MARRUFO (MRN [id number]) as of 09/19/2013 16:35  Ref. Range 06/10/2013 13:29 07/06/2013 17:29 07/07/2013 05:27 07/17/2013 11:19 09/19/2013 11:55  Hemoglobin Latest Range: 12.0-15.0 g/dL 9.6 (L) 9.8 (L) 9.2 (L) 9.6 (L) 10.5 (L)  HCT Latest Range: 36.0-46.0 % 30.3 (L) 30.0 (L) 27.8 (L) 31.4 (L) 31.8 (L)     1645: No carotid/vertebral artery dissection/aneursym. +UTI, UC pending; will start IV rocephin. Dx and testing d/w pt and family.  Questions answered.  Verb understanding, agreeable to admit.  T/C to [facility] Resident, case discussed, including:  HPI, pertinent PM/SHx, VS/PE, dx testing, ED course and treatment:  Agreeable to admit, requests to write temporary orders, obtain tele bed to Attending Dr. Mauricio Po.     Laray Anger, DO 09/21/13 1714

## 2013-09-19 NOTE — Progress Notes (Signed)
Pt arrived from ED via stretcher.  A&0x3.  Oriented to room.   Denies pain or discomfort.  Call bell at reach.  Instructed to call for assistance, verbalized understanding.  Will continue to monitor.  Amanda Pea, Charity fundraiser.

## 2013-09-19 NOTE — Progress Notes (Signed)
[facility] SAMAN GIDDENS - 79 y.o. female MRN [id number]  Date of birth: 03-01-1924  CC, HPI, INTERVAL HISTORY & ROS  [name] is here today for a SDA for neck pain    She reports overall not doing well at all today  Posterior neck pain that started 5 days ago and progressed to Bilateral neck pain at the base of the B skull.  Muscle relaxer, icy hot, did not help  Heat, massage helped the most  Moving head in any motion makes her pain worse and she becomes dyspneic.  She denies any dizziness, spinning or orthostasis.    She has noticed since the onset she does have significant dyspnea on exertion  She has also been experiencing left sided chest pain/pressure that last for 1 hour at a time, non-exertional, spontaneous resolution.    She reports she feels as though she is ready to "go home to [place]" and that she wants to go home so that she can do that.  Denies any dysuria, frequency, urgency.  She has had a foul-smelling odor to her urine that is new.  Pt denies any fevers, chills, or rigors.  Patient denies any facial asymmetry, unilateral weakness, or dysarthria.   She denies any orthopnea, PND, lower extremity swelling  History  Past Medical, Surgical, Social, and Family History Reviewed per EMR Medications and Allergies reviewed and all updated if necessary. Objective Findings  VITALS: HR: 89 bpm  BP: 131/77 mmHg  TEMP: 98.1 F (36.7 C) (Oral)  RESP: 98 %  HT: 4\' 11"  (149.9 cm)  WT: 120 lb (54.432 kg)  BMI: 24.3   BP Readings from Last 3 Encounters:  09/19/13 132/66  09/19/13 131/77  09/08/13 172/77   Wt Readings from Last 3 Encounters:  09/19/13 114 lb (51.71 kg)  09/19/13 120 lb (54.432 kg)  09/08/13 114 lb (51.71 kg)     PHYSICAL EXAM: GENERAL:  elderly African American  female. In mild distress; no respiratory distress  PSYCH: alert and appropriate, good insight   HNEENT:  tenderness to palpation of bilateral skull base.  She has  muscle tightness and spasm.  There are no carotid artery, vertebral artery, temporal artery bruits.    CARDIO: RRR, S1/S2 heard, no murmur  LUNGS: CTA B, no wheezes, no crackles  ABDOMEN:   EXTREM:  Warm, well perfused.  Moves all 4 extremities spontaneously; no lateralization.  No noted foot lesions.  Distal pulses 2+ out of 4 in bilateral dorsalis pedis and posterior tibialis.  Left radial pulse 2+/4, right radial pulse not appreciated.  No pretibial edema.  No gross asymmetry or lateralization of the upper lower extremity is.  She does require assistance for walking due to dyspnea on exertion   GU:   SKIN:     Assessment & Plan   Problems addressed today: General Plan & Pt Instructions:  1. Abnormal urine odor   2. Chest pain, unspecified   3. Bilateral neck pain       Patient to emergency department for evaluation of chest pain as well as neck pain with concern for potential dissection,     For further discussion of A/P and for follow up issues see problem based charting if applicable.

## 2013-09-19 NOTE — ED Notes (Signed)
Pt sent from [facility] for concern of possible dissection. Pt only c/o pain in neck when she turns her head.

## 2013-09-20 DIAGNOSIS — M542 Cervicalgia: Secondary | ICD-10-CM | POA: Diagnosis not present

## 2013-09-20 DIAGNOSIS — R079 Chest pain, unspecified: Secondary | ICD-10-CM | POA: Diagnosis not present

## 2013-09-20 LAB — BASIC METABOLIC PANEL
CO2: 23 mEq/L (ref 19–32)
Calcium: 10.3 mg/dL (ref 8.4–10.5)
GFR calc Af Amer: 84 mL/min — ABNORMAL LOW (ref >90)
GFR calc non Af Amer: 73 mL/min — ABNORMAL LOW (ref >90)
Potassium: 3.8 mEq/L (ref 3.5–5.1)
Sodium: 143 mEq/L (ref 135–145)

## 2013-09-20 LAB — CBC
Hemoglobin: 8.9 g/dL — ABNORMAL LOW (ref 12.0–15.0)
MCV: 68.5 fL — ABNORMAL LOW (ref 78.0–100.0)
Platelets: 193 10*3/uL (ref 150–400)
RBC: 3.97 MIL/uL (ref 3.87–5.11)
WBC: 5 10*3/uL (ref 4.0–10.5)

## 2013-09-20 LAB — PRO B NATRIURETIC PEPTIDE: Pro B Natriuretic peptide (BNP): 421.4 pg/mL (ref 0–450)

## 2013-09-20 MED ORDER — COLCHICINE 0.6 MG PO TABS
1.2000 mg | ORAL_TABLET | Freq: Once | ORAL | Status: AC
  Administered 2013-09-20: 1.2 mg via ORAL
  Filled 2013-09-20: qty 2

## 2013-09-20 MED ORDER — COLCHICINE 0.6 MG PO TABS
0.6000 mg | ORAL_TABLET | Freq: Once | ORAL | Status: DC
  Filled 2013-09-20: qty 1

## 2013-09-20 MED ORDER — ACETAMINOPHEN 325 MG PO TABS: 650.0000 mg | ORAL_TABLET | Freq: Four times a day (QID) | ORAL | Status: DC | PRN

## 2013-09-20 MED ORDER — COLCHICINE 0.6 MG PO TABS: 0.6000 mg | ORAL_TABLET | Freq: Once | ORAL | Status: DC

## 2013-09-20 NOTE — Progress Notes (Signed)
Family Medicine Teaching Service Daily Progress Note Intern Pager: [phone]  [name] Medical record number: [id number] Date of birth: November 06, 1924 Age: 79 y.o. Gender: female  Primary Care Provider: Marena Chancy, MD Consultants: none Code Status: DNR  Pt Overview and Major Events to Date:  09/20/13: pain and weakness improved overnight  Assessment and Plan: [name] is a 79 y.o. female presenting with right neck pain and fatigue and weakness and impending sense of doom. PMH is significant for colon cancer and breast cancer, diabetes, chronic diastolic heart failure and CAD. CT did show evidence of spondylosis which could also explain pain and spasms.   # Neck Pain: CTA ruled out aortic dissection which we were concerned about.  Pain improved with heating pads.  - would benefit from stretching exercises with PT - discharge with heating pads and tylenol for pain   # Weakness: was admitted for this in the past. Seems also to be associated with depression and sense of doom. Unclear what the prompt for this is. TSH recently checked which was normal on 07/07/13.  Appears in better spirits today - PT/OT for mobilization  - continue remeron  # Left foot pain: no obvious signs of gout, but patient reports that it is typical for her gouty flare. Kidney function is good. Will therefore give her colchicine dose for acute flare today. Also treat with tylenol  # h/o CAD and diastolic heart failure:  - continue home meds  - obtain troponin x1  - proBNP ok   # h/o breast cancer: s/p bilateral simple mastectomy  - continue tamoxifen  # DM2: last A1C: 7.7 in 05/2013. On metformin  - goal A1C<8  - CBG monitoring to rule out hypoglycemia  - hold metformin and start ISS  Resume metformin as outpatient FEN/GI: Carved modified diet  Prophylaxis: Heparin   Disposition: home today after evaluation by PT/OT for neck exercises and increased mobility exercises.   Subjective:   Doing much better. Pain in neck has resolved. She now has pain in her left foot. No trauma to it. Feels like previous gout flares  Objective: Temp:  [98.1 F (36.7 C)-99.4 F (37.4 C)] 99.4 F (37.4 C) (11/08 0939) Pulse Rate:  [72-88] 72 (11/08 0939) Resp:  [13-27] 18 (11/08 0502) BP: (130-174)/(50-132) 130/50 mmHg (11/08 0939) SpO2:  [96 %-100 %] 99 % (11/08 0939) Weight:  [109 lb 12.6 oz (49.8 kg)-120 lb (54.432 kg)] 109 lb 12.6 oz (49.8 kg) (11/08 0502) Physical Exam: General: sitting in chair, no acute distress Cardiovascular: S1S2, RRR, no murmur Respiratory: cta b/l Abdomen: soft, non tender, non distended Extremities: no edema Left foot: tenderness along forefoot on plantar and dorsal aspect of foot, no bruising or redness or warmth  Laboratory:  Recent Labs Lab 09/19/13 1155 09/20/13 0455  WBC 6.2 5.0  HGB 10.5* 8.9*  HCT 31.8* 27.2*  PLT 209 193    Recent Labs Lab 09/19/13 1155 09/20/13 0455  NA 138 143  K 3.7 3.8  CL 103 109  CO2 24 23  BUN 15 11  CREATININE 0.89 0.79  CALCIUM 10.7* 10.3  GLUCOSE 185* 127*     Lonia Skinner, MD 09/20/2013, 11:03 AM PGY-3, [place] FPTS Intern pager: [phone], text pages welcome

## 2013-09-20 NOTE — H&P (Signed)
FMTS Attending Admit NOte Patient seen and examined by me and discussed with resident team, I agree with Dr Whitney Muse assessment and plan as documented.  This morning Ms. Facey reports that her neck pain is somewhat better, helped a lot by the heating pad.  She is able to rotate R and L, flex and extend slightly and slowly.  She is able to get up from laying to sitting position and is eating breakfast.  I agree with plan for PT evaluation this morning; heating pad; short-term opioid pain medicine as needed. Close outpatient follow up.  Discharge later today assuming no steps backward clinically.  Paula Compton, MD

## 2013-09-20 NOTE — Evaluation (Signed)
Physical Therapy Evaluation Patient Details Name: [name] MRN: [id number] DOB: 1924/09/03 Today's Date: 09/20/2013 Time: 1045-1100 PT Time Calculation (min): 15 min  PT Assessment / Plan / Recommendation History of Present Illness  Patient is an 79 yo female admitted with neck pain, weakness, and UTI.  Clinical Impression  Patient presents with problems listed below.  Will benefit from acute PT to maximize independence prior to discharge home.  Recommended HHPT - patient declined stating she will be able to move fine once pain in Lt foot "goes away".    PT Assessment  Patient needs continued PT services    Follow Up Recommendations  No PT follow up;Supervision/Assistance - 24 hour (Patient declined HHPT)    Does the patient have the potential to tolerate intense rehabilitation      Barriers to Discharge        Equipment Recommendations  None recommended by PT    Recommendations for Other Services     Frequency Min 3X/week    Precautions / Restrictions Precautions Precautions: Fall Restrictions Weight Bearing Restrictions: No   Pertinent Vitals/Pain Pain 10/10 in Lt foot limiting mobility.  Patient reports it feels like her gout.      Mobility  Bed Mobility Bed Mobility: Supine to Sit;Sitting - Scoot to Edge of Bed Supine to Sit: 4: Min guard;With rails;HOB elevated Sitting - Scoot to Edge of Bed: 5: Supervision;With rail Details for Bed Mobility Assistance: Verbal cues for technique Transfers Transfers: Sit to Stand;Stand to Sit Sit to Stand: 4: Min assist;With upper extremity assist;From bed Stand to Sit: 4: Min assist;With upper extremity assist;With armrests;To chair/3-in-1 Details for Transfer Assistance: Verbal cues for hand placement.  Assist for balance/safety due to pain in Lt foot with weightbearing. - RN notified Ambulation/Gait Ambulation/Gait Assistance: 4: Min assist Ambulation Distance (Feet): 10 Feet Assistive device: Rolling  walker Ambulation/Gait Assistance Details: Cues to try to put Lt foot flat on floor.  Patient weightbearing on lateral edge of foot.   Gait Pattern: Step-to pattern;Decreased stance time - left;Decreased step length - right;Antalgic Gait velocity: Slow General Gait Details: Distance limited by pain    Exercises     PT Diagnosis: Difficulty walking;Generalized weakness;Acute pain  PT Problem List: Decreased strength;Decreased activity tolerance;Decreased balance;Decreased mobility;Decreased knowledge of use of DME;Pain PT Treatment Interventions: DME instruction;Gait training;Functional mobility training;Patient/family education     PT Goals(Current goals can be found in the care plan section) Acute Rehab PT Goals Patient Stated Goal: To decrease pain in Lt foot PT Goal Formulation: With patient Time For Goal Achievement: 09/27/13 Potential to Achieve Goals: Good  Visit Information  Last PT Received On: 09/20/13 Assistance Needed: +1 History of Present Illness: Patient is an 79 yo female admitted with neck pain, weakness, and UTI.       Prior Functioning  Home Living Family/patient expects to be discharged to:: Private residence Living Arrangements: Children (Son) Available Help at Discharge: Family;Available 24 hours/day Type of Home: House Home Access: Stairs to enter [employer] of Steps: 1 Entrance Stairs-Rails: Right Home Layout: One level Home Equipment: Walker - 2 wheels;Walker - 4 wheels;Cane - single point;Bedside commode;Shower seat Prior Function Level of Independence: Independent with assistive device(s) Comments: Uses RW.  Son lives with patient and daughter helps out with errands/transport Communication Communication: No difficulties    Cognition  Cognition Arousal/Alertness: Awake/alert Behavior During Therapy: WFL for tasks assessed/performed Overall Cognitive Status: Within Functional Limits for tasks assessed    Extremity/Trunk Assessment  Upper Extremity Assessment Upper Extremity  Assessment: Overall WFL for tasks assessed Lower Extremity Assessment Lower Extremity Assessment: Generalized weakness   Balance    End of Session PT - End of Session Equipment Utilized During Treatment: Gait belt Activity Tolerance: Patient limited by pain Patient left: in chair;with call bell/phone within reach Nurse Communication: Mobility status;Patient requests pain meds  GP Functional Assessment Tool Used: Clinical judgement Functional Limitation: Mobility: Walking and moving around Mobility: Walking and Moving Around Current Status [phone]): At least 20 percent but less than 40 percent impaired, limited or restricted Mobility: Walking and Moving Around Goal Status [phone]): At least 1 percent but less than 20 percent impaired, limited or restricted   Vena Austria 09/20/2013, 1:19 PM [name], [facility] Acute Rehab Services Pager [phone]

## 2013-09-21 LAB — URINE CULTURE: Colony Count: >=100000

## 2013-09-21 NOTE — Discharge Summary (Signed)
Family Medicine Teaching [facility] Discharge Summary  [name] Medical record number: [id number] Date of birth: Sep 28, 1924 Age: 79 y.o. Gender: female Date of Admission: 09/19/2013  Date of Discharge: 09/20/2013 Admitting Physician: Barbaraann Barthel, MD  Primary Care Provider: Marena Chancy, MD Consultants: None  Indication for Hspitalization: Concern for carotid dissection with weakness  Discharge Diagnoses/Problem List:  1. Neck pain 2. Weakness 3. Left foot pain 4. Diabetes mellitus, type 2  Disposition: Discharge home  Discharge Condition: Stable  Brief Hospital Course: [name] presented to the ED after concern for carotid dissection because of neck pain, associated with a sense of doom, in the context of a history of a cavernous carotid aneurysm. CT neck was unremarkable. Etiology was unclear but patient improved overnight. PT/OT consulted because patient had new complaint of foot pain which she thought was due to a gout flair. They recommended 24 hour supervision/assistance. The patient received one dose of colchicine and we wanted to observe her throughout the day due to her inability to walk because of extreme foot pain. She decided against our plan to keep her and wanted to be discharged. She was discharged with follow-up instructions to see her PCP within the week.  Issues for Follow Up:  1. Possible gout flair, refilled colchicine  Significant Procedures: None  Significant Labs and Imaging:   Recent Labs Lab 09/19/13 1155 09/20/13 0455  WBC 6.2 5.0  HGB 10.5* 8.9*  HCT 31.8* 27.2*  PLT 209 193    Recent Labs Lab 09/19/13 1155 09/20/13 0455  NA 138 143  K 3.7 3.8  CL 103 109  CO2 24 23  GLUCOSE 185* 127*  BUN 15 11  CREATININE 0.89 0.79  CALCIUM 10.7* 10.3    EXAM: 09/19/13  CT ANGIOGRAPHY NECK  TECHNIQUE:  Multidetector CT imaging of the neck was performed using the  standard protocol during bolus administration of  intravenous  contrast. Multiplanar CT image reconstructions including MIPs were  obtained to evaluate the vascular anatomy. Carotid stenosis  measurements (when applicable) are obtained utilizing NASCET  criteria, using the distal internal carotid diameter as the  denominator.  CONTRAST: 50mL OMNIPAQUE IOHEXOL 350 MG/ML SOLN  COMPARISON: CT cervical spine from earlier today.  FINDINGS:  Cervical spine findings are reported separately.  3 vessel aortic arch. Mild atherosclerotic disease in the aortic  arch. Proximal great vessels are patent without significant  stenosis.  Right carotid: The right common carotid artery is widely patent.  There is atherosclerotic disease at the carotid bifurcation with  calcified and noncalcified plaque. No significant stenosis or  dissection.  Left carotid: Atherosclerotic disease is present in the mid left  common carotid artery with calcified plaque but no significant  stenosis. Atherosclerotic calcification is present in the carotid  bulb on the left without significant stenosis. Carotid bifurcation  is widely patent. Negative for dissection. Atherosclerotic  calcification is present in the cavernous carotid bilaterally.  Vertebral arteries: Left vertebral artery is dominant. 75% stenosis  at the origin of the left vertebral artery. Scattered  atherosclerotic disease is present throughout the left vertebral  artery with multiple areas of 50% diameter stenosis. Extensive  atherosclerotic disease in the distal left vertebral artery with 50%  diameter stenosis over a long segment. Negative for vertebral artery  dissection. Right vertebral artery is nondominant with mild 50%  stenosis at the origin. Negative for dissection.  Review of the MIP images confirms the above findings.  IMPRESSION:  Diffuse atherosclerotic disease as  above. No hemodynamically  significant carotid stenosis. There is bilateral vertebral stenosis  left greater than right with a  75% stenosis at the origin of the  left vertebral artery and extensive calcification involving the  distal left vertebral artery.  Negative for carotid or vertebral artery dissection.   Outstanding Results: None  Discharge Medications:    Medication List         acetaminophen 325 MG tablet  Commonly known as:  TYLENOL  Take 2 tablets (650 mg total) by mouth every 6 (six) hours as needed for moderate pain.     albuterol 108 (90 BASE) MCG/ACT inhaler  Commonly known as:  PROVENTIL HFA;VENTOLIN HFA  Inhale 2 puffs into the lungs every 6 (six) hours as needed. For shortness of breath     amLODipine 10 MG tablet  Commonly known as:  NORVASC  Take 1 tablet (10 mg total) by mouth daily.     ASPIRIN CHILDRENS 81 MG chewable tablet  Generic drug:  aspirin  Chew 81 mg by mouth daily.     carvedilol 12.5 MG tablet  Commonly known as:  COREG  Take 1 tablet (12.5 mg total) by mouth 2 (two) times daily with a meal.     colchicine 0.6 MG tablet  Take 1 tablet (0.6 mg total) by mouth once. Take 2 tablets on 1 day, if still having pain in an hour, take 1 more tablet on day 1. After that take 2 tablets daily for 2 weeks then 1 tablet daily. See [place] back in 1 week if not improving or if you develop fevers.     HYDROcodone-acetaminophen 5-325 MG per tablet  Commonly known as:  NORCO/VICODIN  Take 1 tablet by mouth daily as needed for pain.     losartan 50 MG tablet  Commonly known as:  COZAAR  Take 1 tablet (50 mg total) by mouth daily.     metFORMIN 500 MG tablet  Commonly known as:  GLUCOPHAGE  Take 1 tablet (500 mg total) by mouth 2 (two) times daily with a meal.     MILK OF MAGNESIA 400 MG/5ML suspension  Generic drug:  magnesium hydroxide  Take 30 mLs by mouth daily as needed for mild constipation.     mirtazapine 15 MG tablet  Commonly known as:  REMERON  Take 1 tablet (15 mg total) by mouth at bedtime.     PATADAY 0.2 % Soln  Generic drug:  Olopatadine HCl  Place 1 drop  into both eyes daily as needed. For itchy eyes     ranitidine 150 MG capsule  Commonly known as:  ZANTAC  Take 150 mg by mouth 2 (two) times daily as needed for heartburn.     rosuvastatin 20 MG tablet  Commonly known as:  CRESTOR  Take 20 mg by mouth daily.     tamoxifen 20 MG tablet  Commonly known as:  NOLVADEX  Take 20 mg by mouth daily.        Discharge Instructions: Please refer to Patient Instructions section of EMR for full details.  Patient was counseled important signs and symptoms that should prompt return to medical care, changes in medications, dietary instructions, activity restrictions, and follow up appointments.   Follow-Up Appointments: Follow-up Information   Follow up with Marena Chancy, MD. Schedule an appointment as soon as possible for a visit in 1 week. (Call the clinic on Monday and see if you can be seen on Tuesday. )    Specialty:  Family Medicine  Contact information:   [address] [place] [address] [phone]       Jacquelin Hawking, MD 09/21/2013, 8:50 PM PGY-1 [facility] Family [facility]

## 2013-09-21 NOTE — Discharge Summary (Signed)
FMTS Attending Note Patient seen and examined by me on the day of discharge; I discussed with resident team and I agree with Dr Dennison Nancy note as documented.   Paula Compton, MD

## 2013-09-22 NOTE — Progress Notes (Signed)
Received result of Urine Cx collected at time of patient's recent admission for indication of malodorous urine without symptoms.  Result is E coli with some resistance on antibiogram.  Given that patient was asymptomatic at time of collection, decision not to treat this culture result was discussed with Dr Gwenlyn Saran, patient's primary physician. JB

## 2013-10-08 ENCOUNTER — Ambulatory Visit: Payer: Medicare Other | Admitting: Family Medicine

## 2013-10-21 ENCOUNTER — Encounter: Payer: Self-pay | Admitting: Family Medicine

## 2013-10-21 ENCOUNTER — Ambulatory Visit (INDEPENDENT_AMBULATORY_CARE_PROVIDER_SITE_OTHER): Payer: Medicare Other | Admitting: Family Medicine

## 2013-10-21 VITALS — BP 174/78 | HR 92 | Ht 59.0 in | Wt 121.0 lb

## 2013-10-21 DIAGNOSIS — N393 Stress incontinence (female) (male): Secondary | ICD-10-CM | POA: Diagnosis not present

## 2013-10-21 DIAGNOSIS — N390 Urinary tract infection, site not specified: Secondary | ICD-10-CM

## 2013-10-21 DIAGNOSIS — E119 Type 2 diabetes mellitus without complications: Secondary | ICD-10-CM | POA: Diagnosis not present

## 2013-10-21 DIAGNOSIS — M542 Cervicalgia: Secondary | ICD-10-CM

## 2013-10-21 DIAGNOSIS — I10 Essential (primary) hypertension: Secondary | ICD-10-CM | POA: Diagnosis not present

## 2013-10-21 DIAGNOSIS — M545 Low back pain, unspecified: Secondary | ICD-10-CM

## 2013-10-21 MED ORDER — HYDROCODONE-ACETAMINOPHEN 5-325 MG PO TABS: 1.0000 | ORAL_TABLET | Freq: Every day | ORAL | Status: DC | PRN

## 2013-10-21 MED ORDER — LOSARTAN POTASSIUM 50 MG PO TABS: 50.0000 mg | ORAL_TABLET | Freq: Every day | ORAL | Status: DC

## 2013-10-21 MED ORDER — ASSURANCE UNDERWEAR WOMENS MISC: 1.0000 "application " | Freq: Every day | Status: DC

## 2013-10-21 NOTE — Patient Instructions (Signed)
Your diabetes is doing well. Continue taking your metformin.  Continue taking your blood pressure medicines.  If the pain in your neck gets worst, come back to see me. Continue warm heating pads and tylenol.

## 2013-10-22 DIAGNOSIS — M542 Cervicalgia: Secondary | ICD-10-CM | POA: Insufficient documentation

## 2013-10-22 NOTE — Assessment & Plan Note (Signed)
S/p CTA neck which was unremarkable. Likely musculoskeletal. Continue warm pads and pain meds.

## 2013-10-22 NOTE — Assessment & Plan Note (Signed)
No red flags. Chronic in nature. reflled vicodin. Am aware that vicodin is not the best option for 79 yo female. At next visit, will try to switch to tramadol.

## 2013-10-22 NOTE — Assessment & Plan Note (Signed)
Urine culture positive for >100,000 E-coli. Asymptomatic at this time. Will not treat. Reviewed symptoms to return for with patient.

## 2013-10-22 NOTE — Assessment & Plan Note (Signed)
Well controled on metformin 500mg  bid with A1C of 6.9. No hypoglycemia. Continue current regimen.
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
## 2013-10-22 NOTE — Assessment & Plan Note (Signed)
Repeat improved to 130/76. Continue current regimen: norvasc, carvedilol, losartan.

## 2013-11-01 ENCOUNTER — Other Ambulatory Visit: Payer: Self-pay | Admitting: Family Medicine

## 2013-11-10 IMAGING — CR DG CHEST 2V
1 series · 1 of 1 positions shown · non-contrast
Comparison: Multiple priors

CLINICAL DATA: Neck pain.  Productive cough.

EXAM:
CHEST  2 VIEW

[view not recorded]
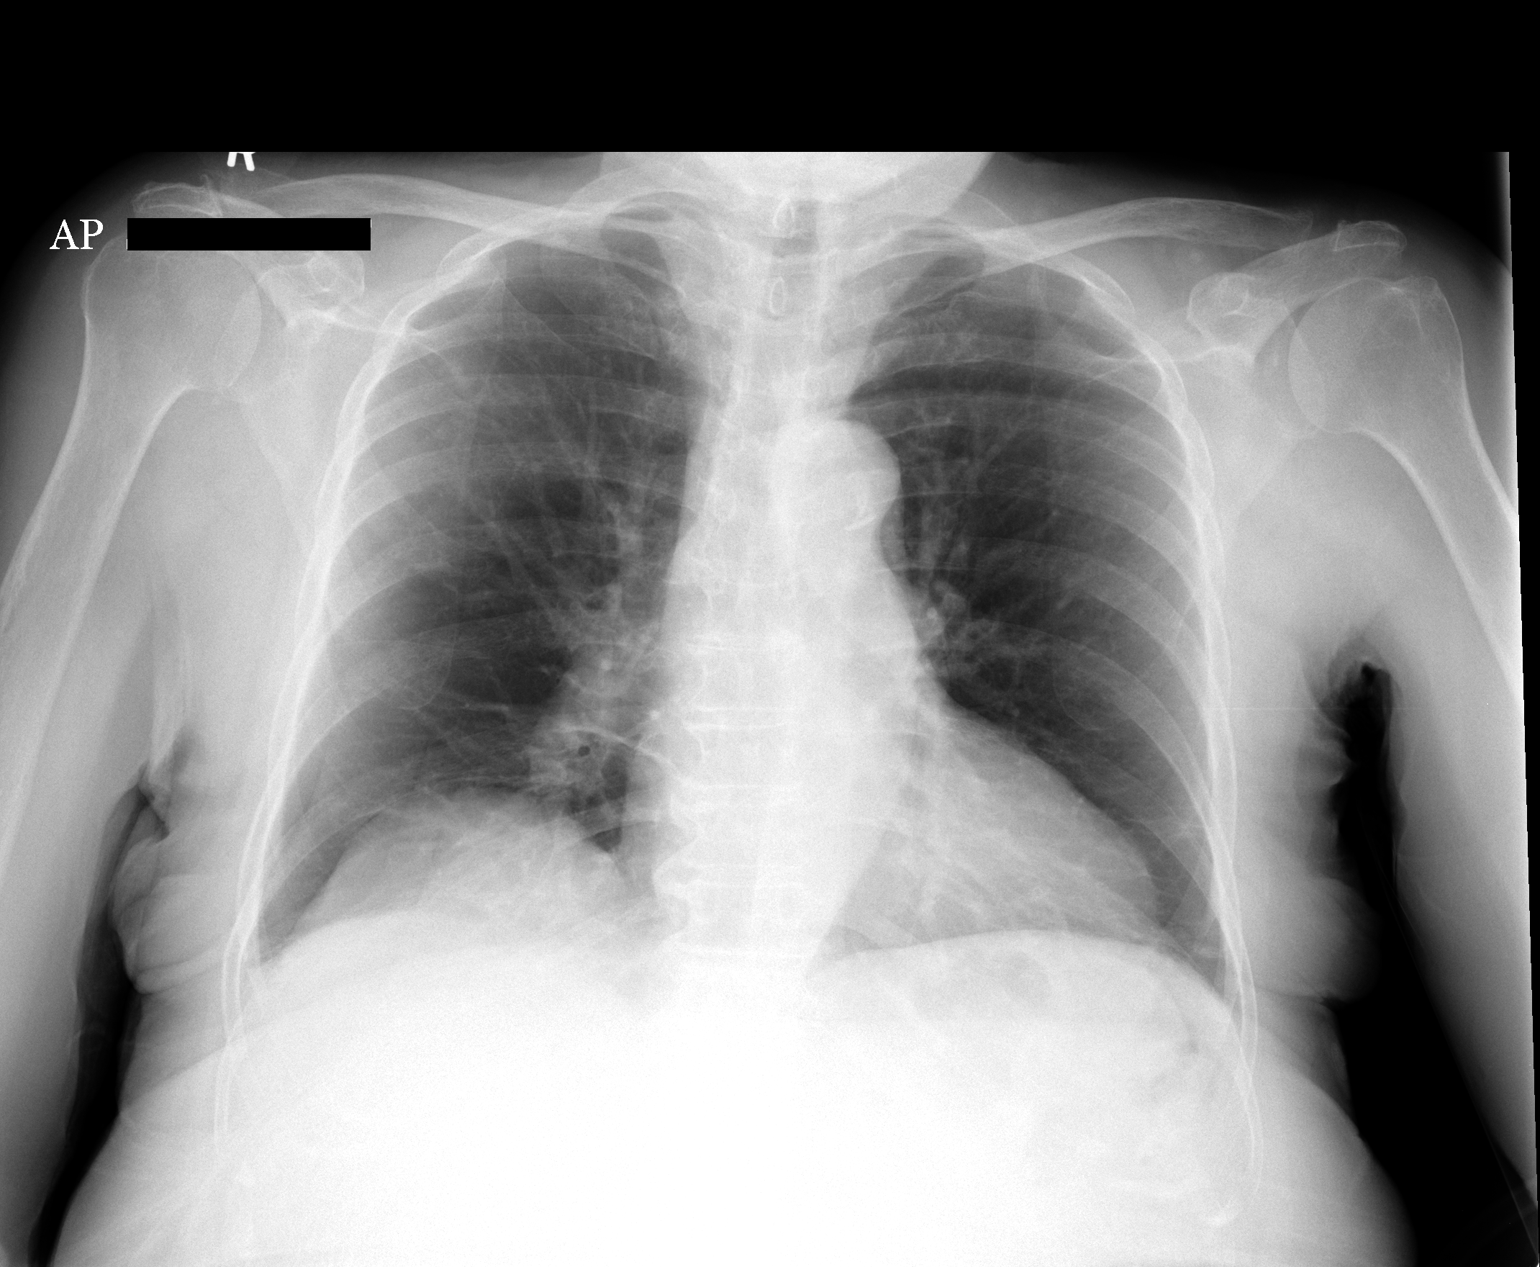

[1 of 1 positions shown; findings below may reference images not displayed]

FINDINGS: The cardiomediastinal silhouette is unchanged. No focal infiltrate
or edema. No pleural effusion or pneumothorax. No acute osseous
abnormality.
IMPRESSION: No active cardiopulmonary disease.

## 2013-11-10 IMAGING — CT CT CERVICAL SPINE W/O CM
5 series · 16 of 33 positions shown, 18 images · IV contrast (CONTRAST)
Comparison: CT angiogram neck performed today.

CLINICAL DATA: Neck pain. Rule out dissection

EXAM:
CT CERVICAL SPINE WITHOUT CONTRAST
TECHNIQUE: Multidetector CT imaging of the cervical spine was performed without
intravenous contrast. Multiplanar CT image reconstructions were also
generated.

[Series 8068: mpr, cspine, axial · axial · 0.31mm/px · z∈[+233,+307]mm · 3 of 75 slices shown, 4 images]
[im 19/75  soft-tissue]
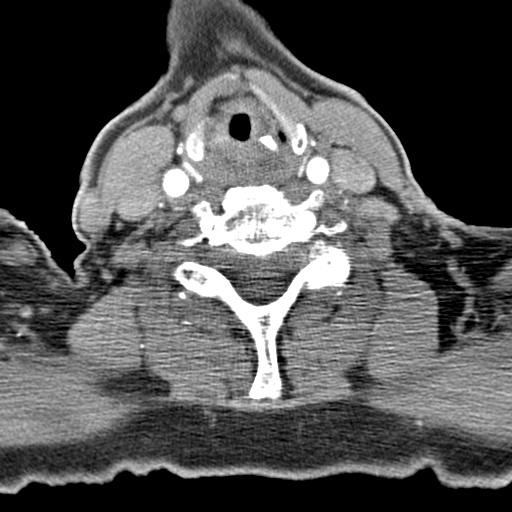
[im 19/75  bone]
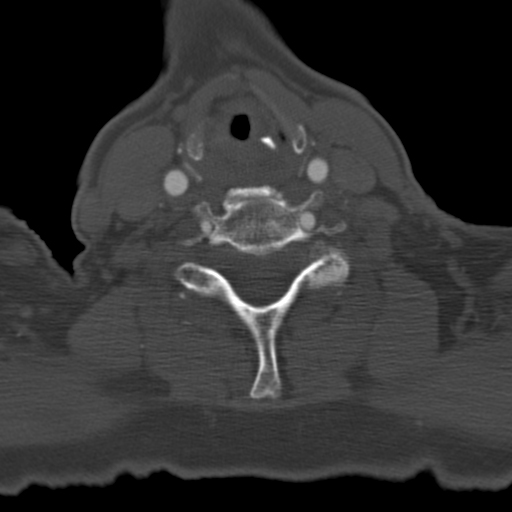
[im 38/75  bone]
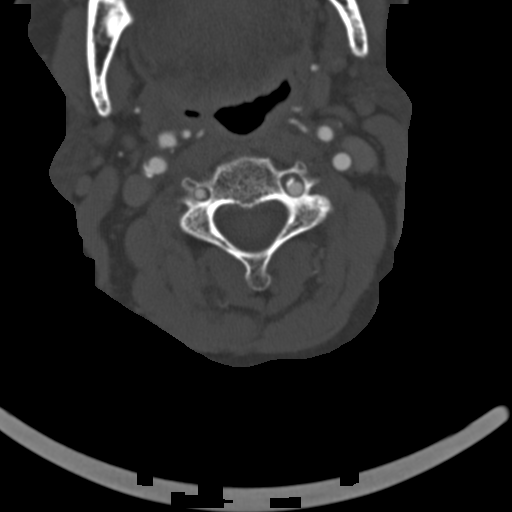
[im 56/75  bone]
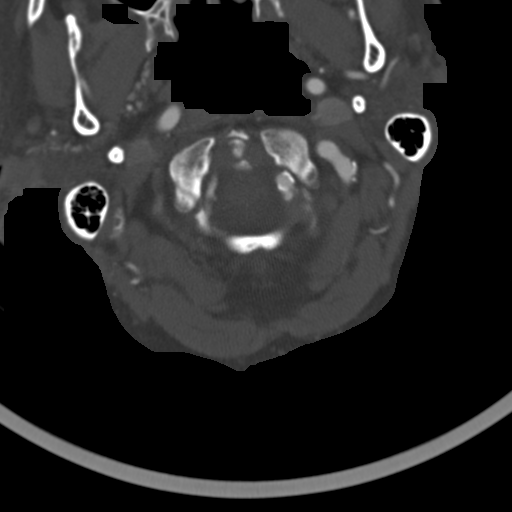

[cspine · axial · 0.31mm/px · z∈[+219,+303]mm · 3 of 85 slices shown]
[im 22/85  bone]
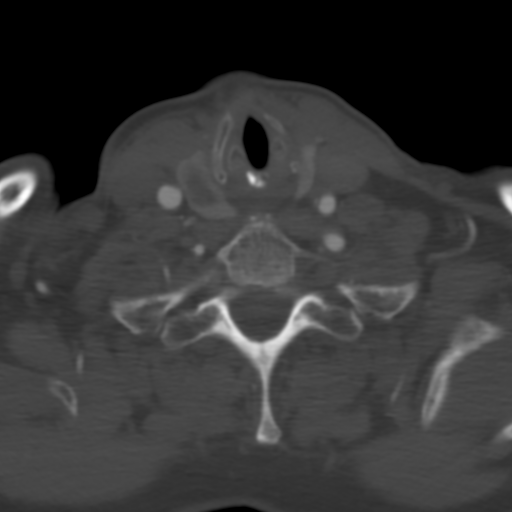
[im 43/85  bone]
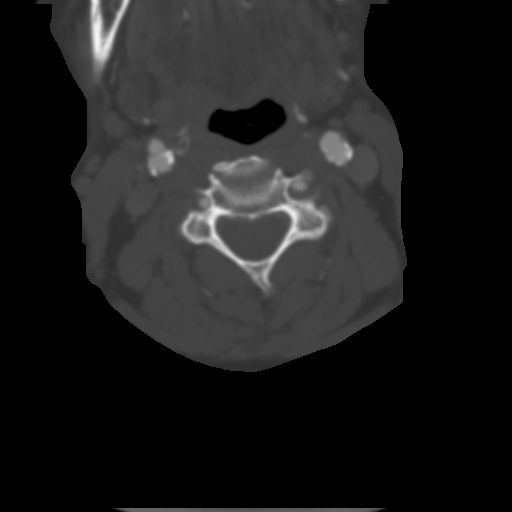
[im 64/85  bone]
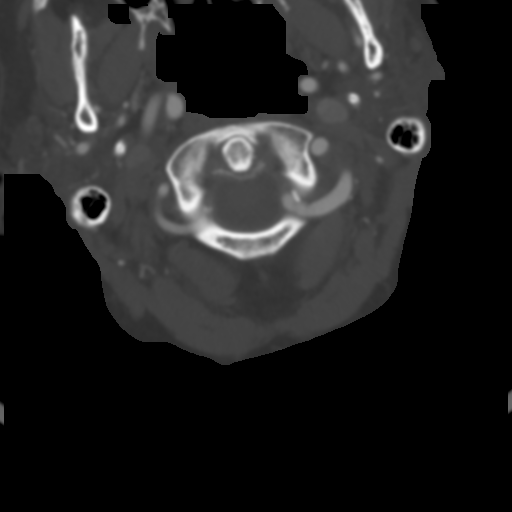

[mpr, sagittals, sagittal · sagittal · 0.33mm/px · 5 of 45 slices shown, 6 images]
[im 15/45  bone]
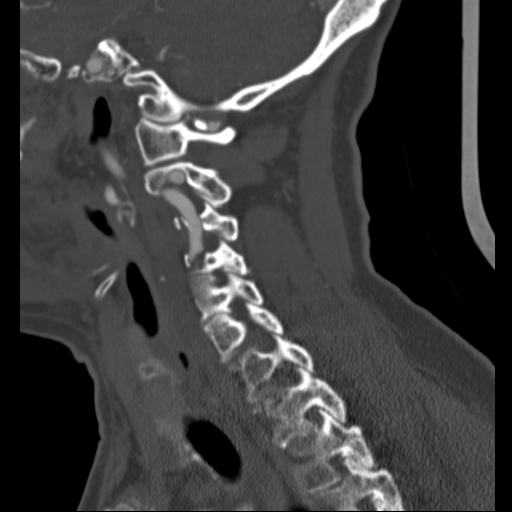
[im 19/45  bone]
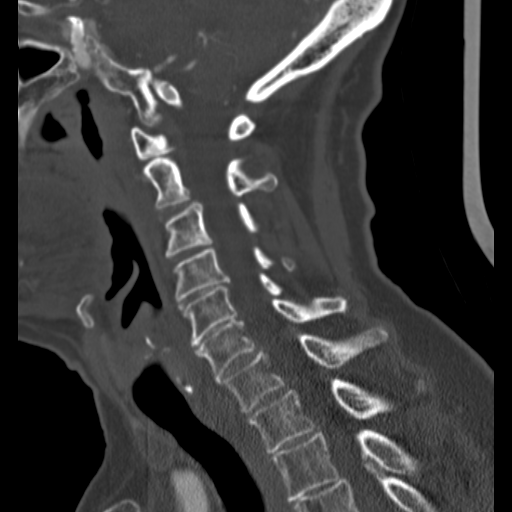
[im 23/45  soft-tissue]
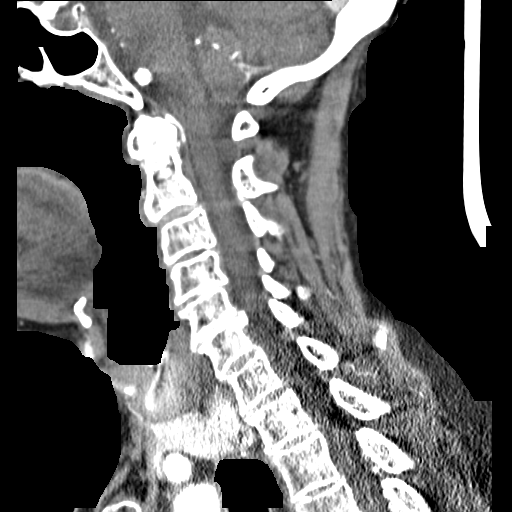
[im 23/45  bone]
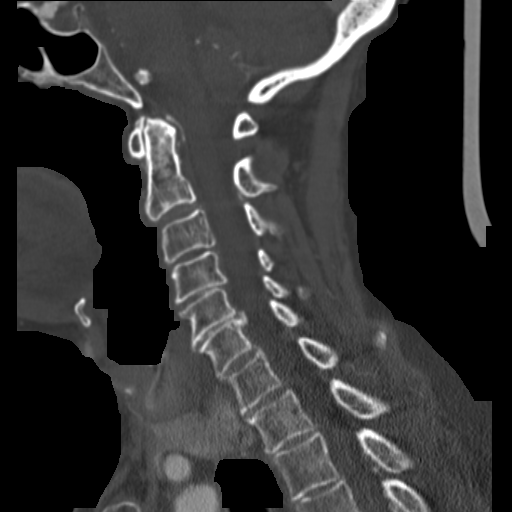
[im 26/45  bone]
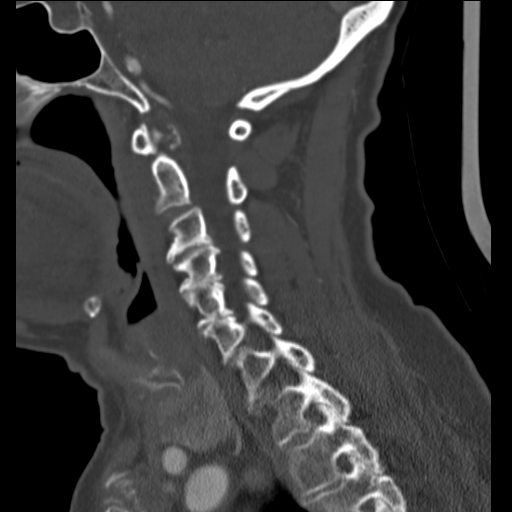
[im 30/45  bone]
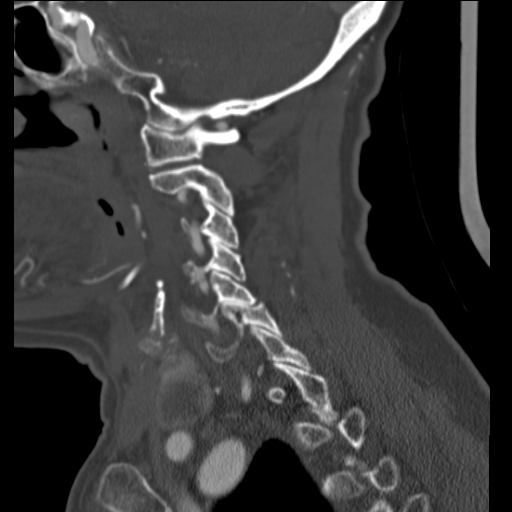

[mpr, coronals, coronal · coronal · 0.33mm/px · 3 of 42 slices shown]
[im 9/42  bone]
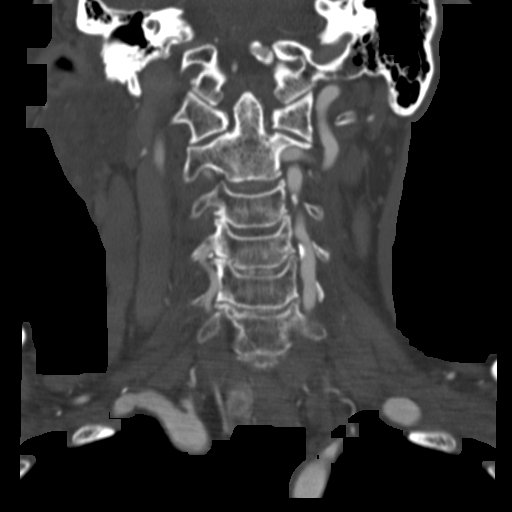
[im 17/42  bone]
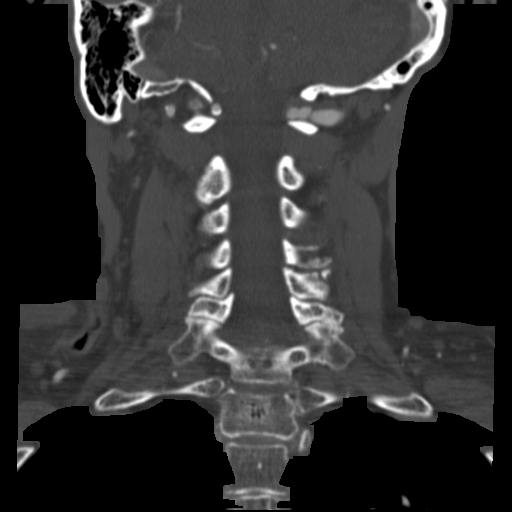
[im 25/42  bone]
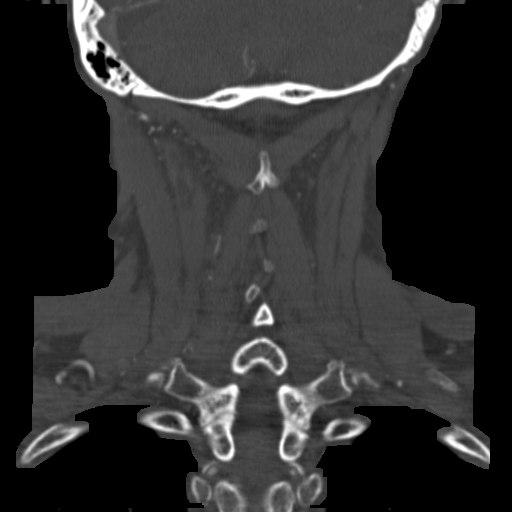

[mpr, orthogonals · axial · 0.31mm/px · z∈[+231,+273]mm · 2 of 68 slices shown]
[im 23/68  bone]
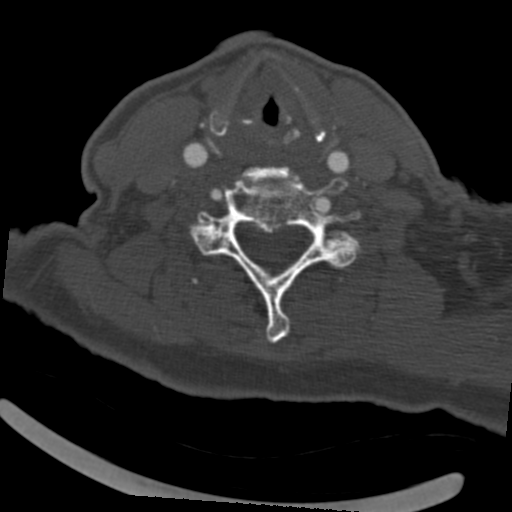
[im 45/68  bone]
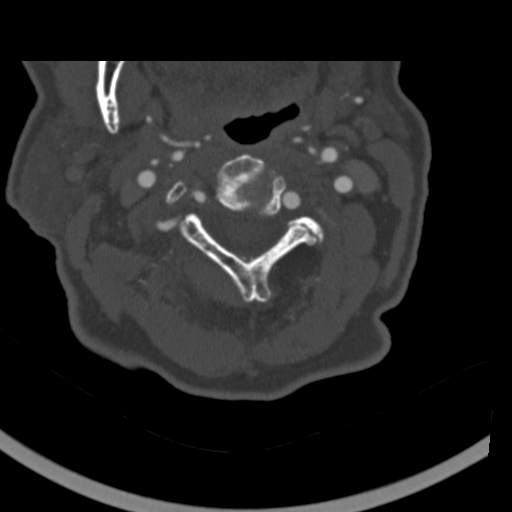

[16 of 33 positions shown; findings below may reference images not displayed]

Intravenous contrast
was given for that study. See separate CT angiogram report for
vascular findings.
FINDINGS: Cervical spondylosis at multiple levels. Negative for fracture or
mass lesion.

Thyroid nodules are present bilaterally. The largest nodule on the
right measures 10 x 16 mm. Largest nodule on the left measures 5 x
12 mm.

C2-3: Mild disc degeneration with early spurring

C3-4: Disc degeneration with spondylosis. Mild right foraminal
narrowing.

C4-5: Disc degeneration and spondylosis causing moderate foraminal
encroachment bilaterally and mild spinal stenosis

C5-6: Disc degeneration and spondylosis. Mild spinal stenosis and
mild foraminal encroachment bilaterally

C6-7: Mild disc degeneration. Mild anterior slip measuring 1 mm. No
significant stenosis

C7-T1: Negative
IMPRESSION: Mild to moderate cervical spondylosis. Negative for fracture or
mass. No acute bony abnormality.

## 2013-11-10 IMAGING — CT CT ANGIO NECK
2 of 8 series · 18 of 36 positions shown · IV contrast (CONTRAST)
Comparison: CT cervical spine from earlier today.

CLINICAL DATA: Neck pain while turning head. No trauma. Rule out
dissection.

EXAM:
CT ANGIOGRAPHY NECK
TECHNIQUE: Multidetector CT imaging of the neck was performed using the
standard protocol during bolus administration of intravenous
contrast. Multiplanar CT image reconstructions including MIPs were
obtained to evaluate the vascular anatomy. Carotid stenosis
measurements (when applicable) are obtained utilizing NASCET
criteria, using the distal internal carotid diameter as the
denominator.
CONTRAST:  50mL OMNIPAQUE IOHEXOL 350 MG/ML SOLN

[mpr, ax 1x1 mpr, axial · axial · 0.50mm/px · z∈[+133,+343]mm · 15 of 240 slices shown]
[im 15/240  lung]
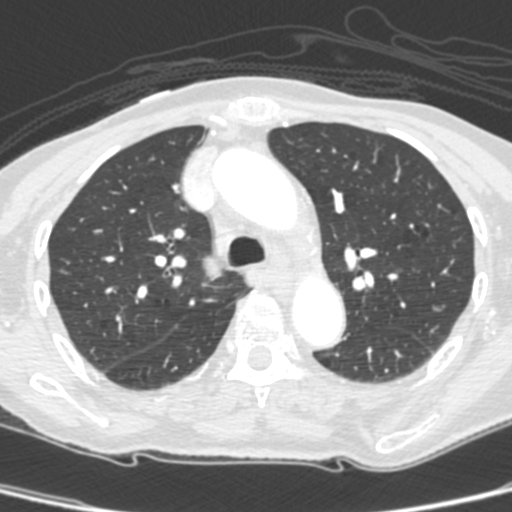
[im 29/240  mediastinal]
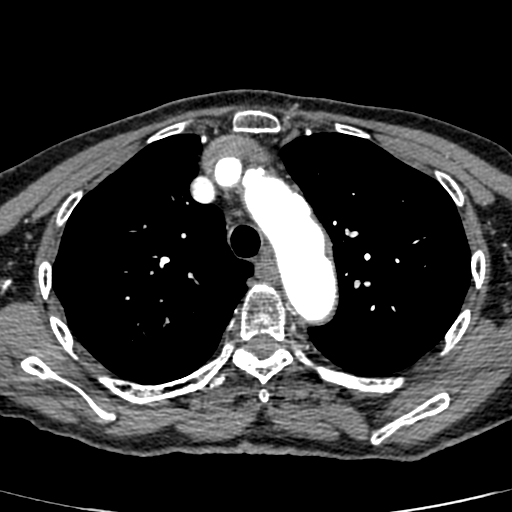
[im 43/240  lung]
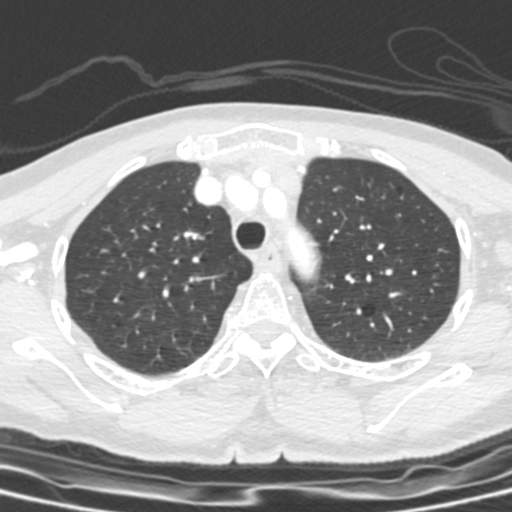
[im 57/240  mediastinal]
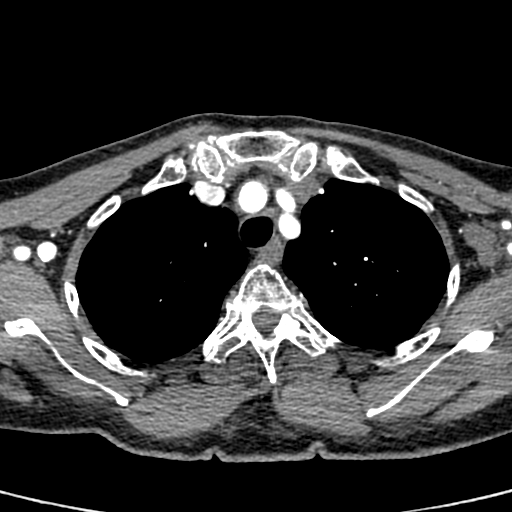
[im 71/240  lung]
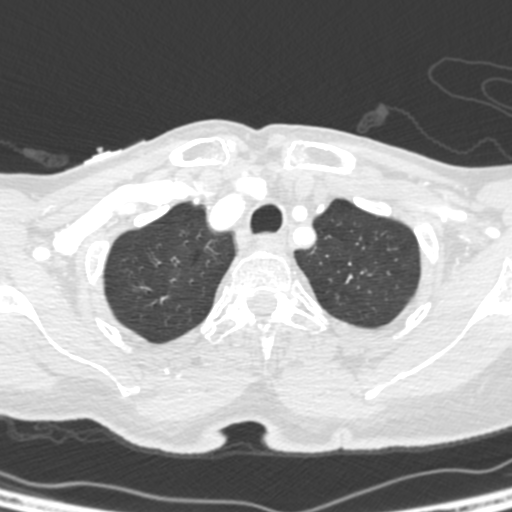
[im 85/240  mediastinal]
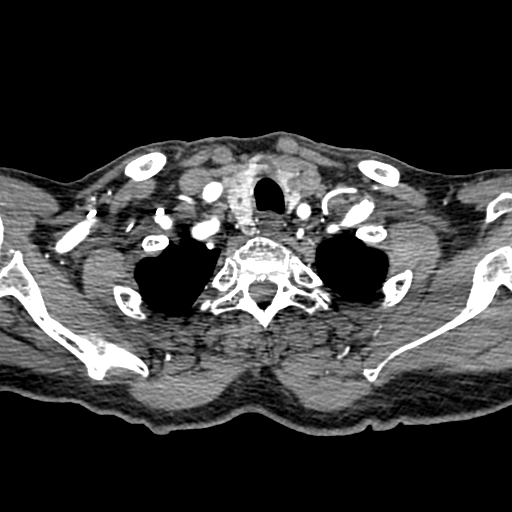
[im 99/240  lung]
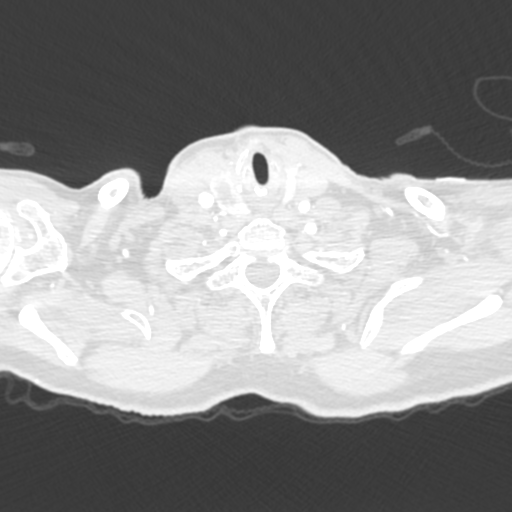
[im 127/240  mediastinal]
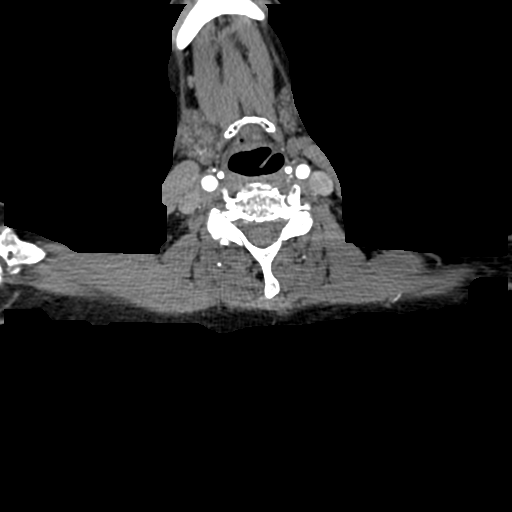
[im 141/240  lung]
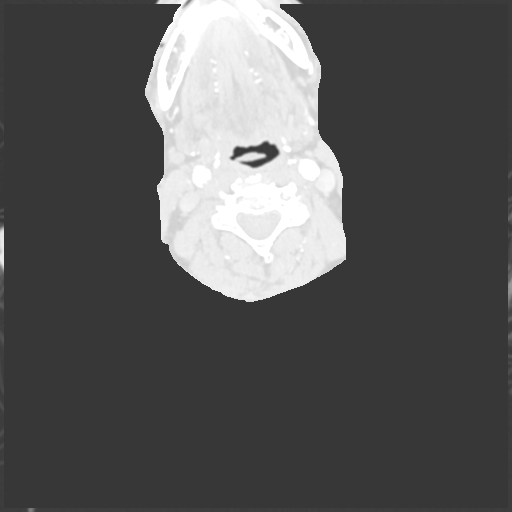
[im 155/240  mediastinal]
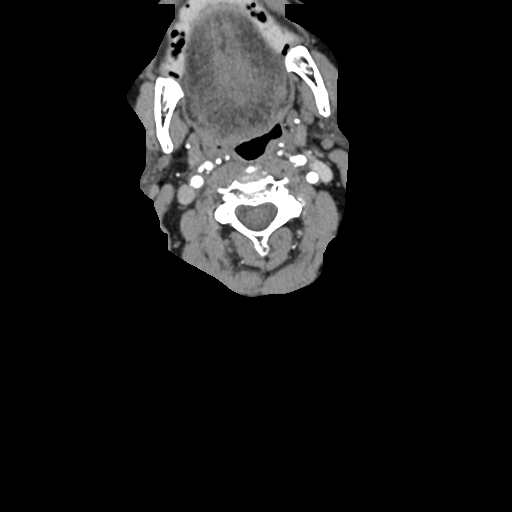
[im 169/240  lung]
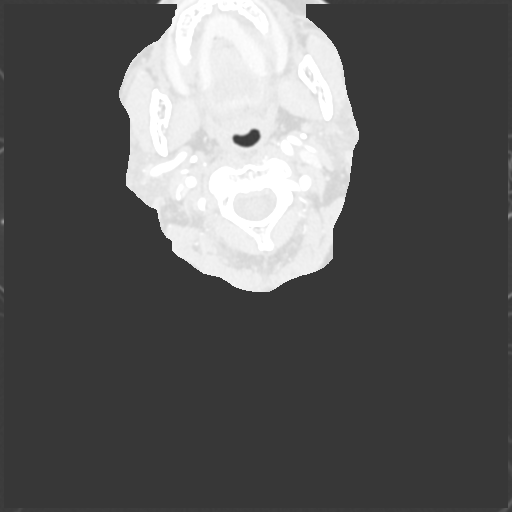
[im 183/240  mediastinal]
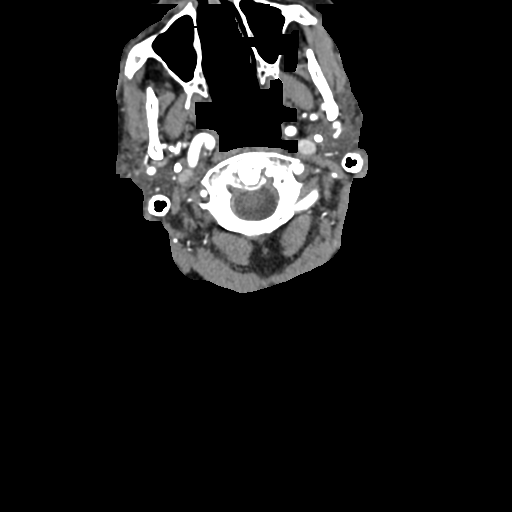
[im 197/240  lung]
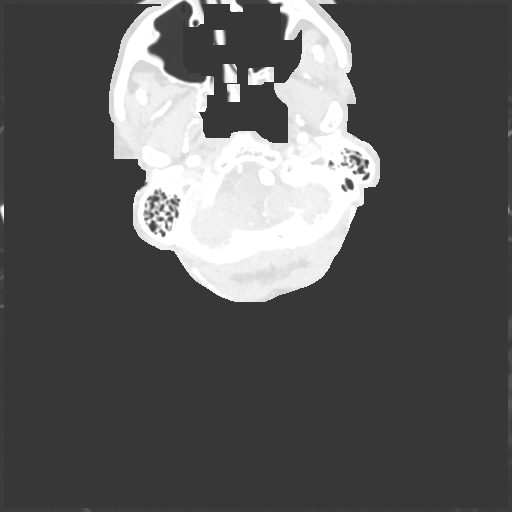
[im 211/240  mediastinal]
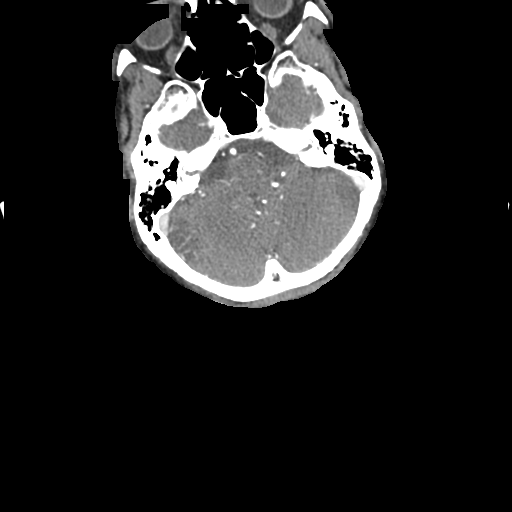
[im 225/240  lung]
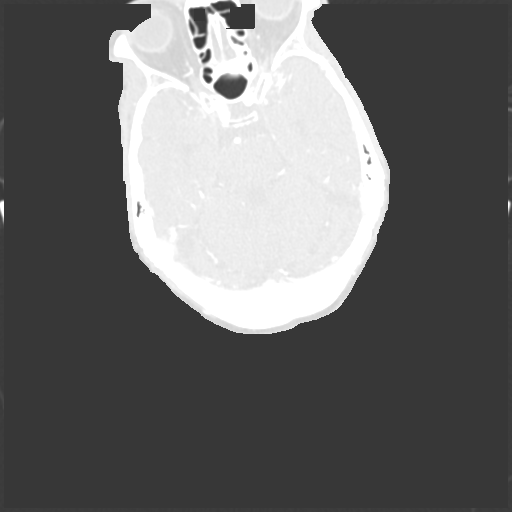

[mpr, cor 1x1 mpr, coronal · coronal · 0.50mm/px · 3 of 201 slices shown]
[im 51/201  mediastinal]
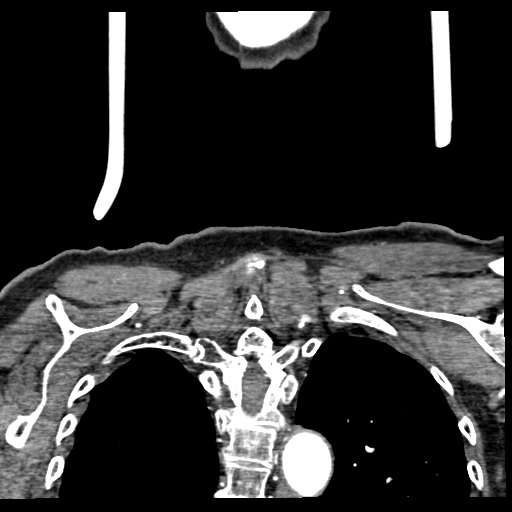
[im 101/201  mediastinal]
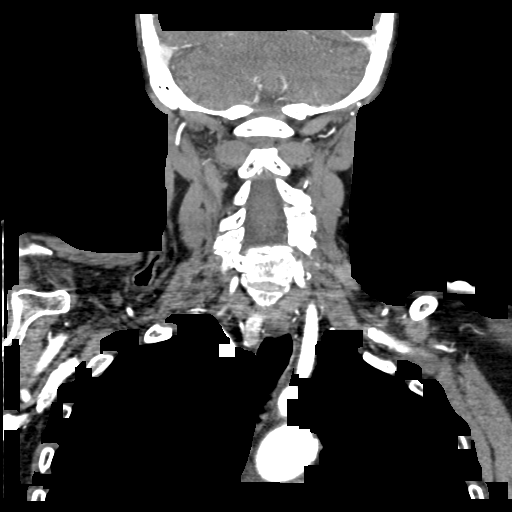
[im 151/201  mediastinal]
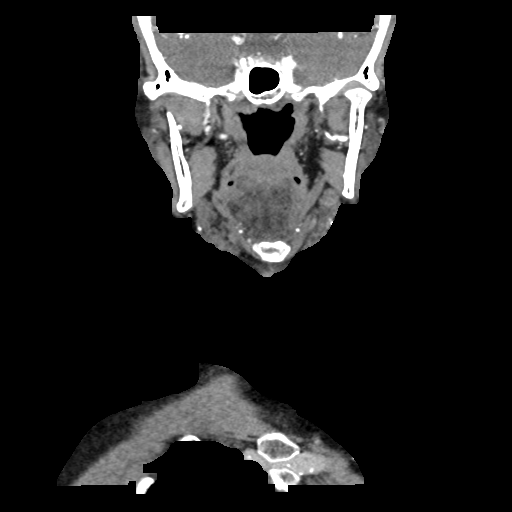

[18 of 36 positions shown; findings below may reference images not displayed]

FINDINGS: Cervical spine findings are reported separately.

3 vessel aortic arch. Mild atherosclerotic disease in the aortic
arch. Proximal great vessels are patent without significant
stenosis.

Right carotid: The right common carotid artery is widely patent.
There is atherosclerotic disease at the carotid bifurcation with
calcified and noncalcified plaque. No significant stenosis or
dissection.

Left carotid: Atherosclerotic disease is present in the mid left
common carotid artery with calcified plaque but no significant
stenosis. Atherosclerotic calcification is present in the carotid
bulb on the left without significant stenosis. Carotid bifurcation
is widely patent. Negative for dissection. Atherosclerotic
calcification is present in the cavernous carotid bilaterally.

Vertebral arteries: Left vertebral artery is dominant. 75% stenosis
at the origin of the left vertebral artery. Scattered
atherosclerotic disease is present throughout the left vertebral
artery with multiple areas of 50% diameter stenosis. Extensive
atherosclerotic disease in the distal left vertebral artery with 50%
diameter stenosis over a long segment. Negative for vertebral artery
dissection. Right vertebral artery is nondominant with mild 50%
stenosis at the origin. Negative for dissection.

Review of the MIP images confirms the above findings.
IMPRESSION: Diffuse atherosclerotic disease as above. No hemodynamically
significant carotid stenosis. There is bilateral vertebral stenosis
left greater than right with a 75% stenosis at the origin of the
left vertebral artery and extensive calcification involving the
distal left vertebral artery.

Negative for carotid or vertebral artery dissection.

## 2013-11-15 ENCOUNTER — Other Ambulatory Visit: Payer: Self-pay | Admitting: Family Medicine

## 2013-11-18 ENCOUNTER — Other Ambulatory Visit (HOSPITAL_COMMUNITY): Payer: Self-pay | Admitting: Family Medicine

## 2013-11-20 ENCOUNTER — Other Ambulatory Visit: Payer: Self-pay | Admitting: Family Medicine

## 2013-11-25 ENCOUNTER — Telehealth: Payer: Self-pay | Admitting: Family Medicine

## 2013-11-25 DIAGNOSIS — M545 Low back pain, unspecified: Secondary | ICD-10-CM

## 2013-11-25 MED ORDER — HYDROCODONE-ACETAMINOPHEN 5-325 MG PO TABS: 1.0000 | ORAL_TABLET | Freq: Every day | ORAL | Status: DC | PRN

## 2013-11-25 NOTE — Telephone Encounter (Signed)
Please let patient know that patient can come to front desk to pick up medication. She will need to be seen in the office before her next refill in accordance to our narcotic medication policy. Thank you!  Kao Conry, PGY-3  Family Medicine Resident  

## 2013-11-25 NOTE — Telephone Encounter (Signed)
Pt called and needs hydrocodone for her back pain called in or to pickup jw

## 2013-11-25 NOTE — Telephone Encounter (Signed)
Please let patient know that patient can come to front desk to pick up medication. She will need to be seen in the office before her next refill in accordance to our narcotic medication policy. Thank you!  Liam Graham, PGY-3  Family Medicine Resident

## 2013-11-25 NOTE — Telephone Encounter (Signed)
Called pt. Informed. .Marae Cottrell  

## 2013-11-25 NOTE — Telephone Encounter (Signed)
Will fwd to MD.  Danaysha Kirn L, CMA  

## 2013-12-01 ENCOUNTER — Ambulatory Visit (INDEPENDENT_AMBULATORY_CARE_PROVIDER_SITE_OTHER): Payer: Medicare Other | Admitting: Podiatry

## 2013-12-01 VITALS — BP 119/90 | HR 79 | Resp 12

## 2013-12-01 DIAGNOSIS — B351 Tinea unguium: Secondary | ICD-10-CM | POA: Diagnosis not present

## 2013-12-01 DIAGNOSIS — M79609 Pain in unspecified limb: Secondary | ICD-10-CM

## 2013-12-01 NOTE — Progress Notes (Signed)
Patient ID: AXEL MEAS, female   DOB: [birth year], 80 y.o.   MRN: [id number]  Subjective: This alert orientated x40 80 year old black female known diabetic  presents complaining of painful toenails. She is a patient in our practice since 2005.   Objective: Hypertrophic, elongated, brown, brittle toenails with palpable tenderness in all nail plates x10.   Assessment: Symptomatic onychomycoses x10   Plan: All nails are debrided back without any bleeding and reappoint at three-month intervals.  Angellynn Kimberlin C.Amalia Hailey, DPM

## 2013-12-11 ENCOUNTER — Ambulatory Visit (HOSPITAL_BASED_OUTPATIENT_CLINIC_OR_DEPARTMENT_OTHER): Payer: Medicare Other | Admitting: Hematology and Oncology

## 2013-12-11 ENCOUNTER — Telehealth: Payer: Self-pay | Admitting: Hematology and Oncology

## 2013-12-11 ENCOUNTER — Other Ambulatory Visit (HOSPITAL_BASED_OUTPATIENT_CLINIC_OR_DEPARTMENT_OTHER): Payer: Medicare Other

## 2013-12-11 ENCOUNTER — Encounter: Payer: Self-pay | Admitting: Hematology and Oncology

## 2013-12-11 VITALS — BP 155/55 | HR 81 | Temp 98.1°F | Resp 18 | Ht 59.0 in | Wt 124.0 lb

## 2013-12-11 DIAGNOSIS — Z853 Personal history of malignant neoplasm of breast: Secondary | ICD-10-CM | POA: Diagnosis not present

## 2013-12-11 DIAGNOSIS — C189 Malignant neoplasm of colon, unspecified: Secondary | ICD-10-CM

## 2013-12-11 DIAGNOSIS — Z85038 Personal history of other malignant neoplasm of large intestine: Secondary | ICD-10-CM

## 2013-12-11 DIAGNOSIS — D509 Iron deficiency anemia, unspecified: Secondary | ICD-10-CM

## 2013-12-11 HISTORY — DX: Malignant neoplasm of colon, unspecified: C18.9

## 2013-12-11 LAB — CBC WITH DIFFERENTIAL/PLATELET
BASO%: 1.1 % (ref 0.0–2.0)
BASOS ABS: 0.1 10*3/uL (ref 0.0–0.1)
EOS ABS: 0.1 10*3/uL (ref 0.0–0.5)
EOS%: 2.3 % (ref 0.0–7.0)
HEMATOCRIT: 32.8 % — AB (ref 34.8–46.6)
HEMOGLOBIN: 10.3 g/dL — AB (ref 11.6–15.9)
LYMPH%: 26.9 % (ref 14.0–49.7)
MCH: 22.9 pg — ABNORMAL LOW (ref 25.1–34.0)
MCHC: 31.5 g/dL (ref 31.5–36.0)
MCV: 72.9 fL — AB (ref 79.5–101.0)
MONO#: 0.4 10*3/uL (ref 0.1–0.9)
MONO%: 6.2 % (ref 0.0–14.0)
NEUT#: 3.8 10*3/uL (ref 1.5–6.5)
NEUT%: 63.5 % (ref 38.4–76.8)
Platelets: 167 10*3/uL (ref 145–400)
RBC: 4.51 10*6/uL (ref 3.70–5.45)
RDW: 15.8 % — ABNORMAL HIGH (ref 11.2–14.5)
WBC: 6 10*3/uL (ref 3.9–10.3)
lymph#: 1.6 10*3/uL (ref 0.9–3.3)

## 2013-12-11 LAB — COMPREHENSIVE METABOLIC PANEL (CC13)
ALT: 9 U/L (ref 0–55)
AST: 14 U/L (ref 5–34)
Albumin: 3.8 g/dL (ref 3.5–5.0)
Alkaline Phosphatase: 49 U/L (ref 40–150)
Anion Gap: 10 mEq/L (ref 3–11)
BUN: 12.7 mg/dL (ref 7.0–26.0)
CALCIUM: 10.8 mg/dL — AB (ref 8.4–10.4)
CHLORIDE: 108 meq/L (ref 98–109)
CO2: 23 mEq/L (ref 22–29)
CREATININE: 1 mg/dL (ref 0.6–1.1)
GLUCOSE: 205 mg/dL — AB (ref 70–140)
Potassium: 3.9 mEq/L (ref 3.5–5.1)
Sodium: 141 mEq/L (ref 136–145)
Total Bilirubin: 0.25 mg/dL (ref 0.20–1.20)
Total Protein: 7.4 g/dL (ref 6.4–8.3)

## 2013-12-11 NOTE — Progress Notes (Signed)
[facility] OFFICE PROGRESS NOTE  Patient Care Team: Kandis Nab, MD as PCP - General (Family Medicine) Nira Retort, MD as Consulting Physician (Hematology and Oncology)  DIAGNOSIS: History of colon cancer, staging unknown and history of bilateral breast cancers  SUMMARY OF ONCOLOGIC HISTORY: This is a pleasant 80 year old lady was found to have problems with breast cancer from screening mammogram. She has simple bilateral mastectomies and bilateral axillary lymph node dissection in 2001. On the right breast, showed a T1N0M0 left breast to have T1N1M0 , both of them stained strongly for estrogen receptor positivity. She was placed on tamoxifen from January 2012  INTERVAL HISTORY: [name] 80 y.o. female returns for further followup. She denies any recent abnormal breast examination, palpable mass, abnormal breast appearance or nipple changes She denies any side effects from tamoxifen such as myalgias, arthralgias or hot flashes.  I have reviewed the past medical history, past surgical history, social history and family history with the patient and they are unchanged from previous note.  ALLERGIES:  is allergic to peanuts and lisinopril.  MEDICATIONS:  Current Outpatient Prescriptions  Medication Sig Dispense Refill  . acetaminophen (TYLENOL) 325 MG tablet Take 2 tablets (650 mg total) by mouth every 6 (six) hours as needed for moderate pain.  60 tablet  1  . albuterol (PROVENTIL HFA;VENTOLIN HFA) 108 (90 BASE) MCG/ACT inhaler Inhale 2 puffs into the lungs every 6 (six) hours as needed. For shortness of breath  1 Inhaler  6  . amLODipine (NORVASC) 10 MG tablet TAKE 1 TABLET (10 MG TOTAL) BY MOUTH DAILY.  30 tablet  3  . aspirin (ASPIRIN CHILDRENS) 81 MG chewable tablet Chew 81 mg by mouth daily.        . carvedilol (COREG) 12.5 MG tablet TAKE 1 TABLET (12.5 MG TOTAL) BY MOUTH 2 (TWO) TIMES DAILY WITH A MEAL.  60 tablet  3  . colchicine 0.6 MG tablet Take 1  tablet (0.6 mg total) by mouth once. Take 2 tablets on 1 day, if still having pain in an hour, take 1 more tablet on day 1. After that take 2 tablets daily for 2 weeks then 1 tablet daily. See [place] back in 1 week if not improving or if you develop fevers.  60 tablet  0  . HYDROcodone-acetaminophen (NORCO/VICODIN) 5-325 MG per tablet Take 1 tablet by mouth daily as needed.  30 tablet  0  . Incontinence Supply Disposable (ASSURANCE UNDERWEAR WOMENS) MISC 1 application by Does not apply route daily.  48 each  6  . losartan (COZAAR) 50 MG tablet Take 1 tablet (50 mg total) by mouth daily.  30 tablet  4  . magnesium hydroxide (MILK OF MAGNESIA) 400 MG/5ML suspension Take 30 mLs by mouth daily as needed for mild constipation.      . metFORMIN (GLUCOPHAGE) 500 MG tablet Take 1 tablet (500 mg total) by mouth 2 (two) times daily with a meal.  60 tablet  5  . mirtazapine (REMERON) 15 MG tablet TAKE 1 TABLET (15 MG TOTAL) BY MOUTH AT BEDTIME.  30 tablet  3  . PATADAY 0.2 % SOLN Place 1 drop into both eyes daily as needed. For itchy eyes      . ranitidine (ZANTAC) 150 MG capsule Take 150 mg by mouth 2 (two) times daily as needed for heartburn.      . rosuvastatin (CRESTOR) 20 MG tablet Take 20 mg by mouth daily.      . tamoxifen (  NOLVADEX) 20 MG tablet Take 20 mg by mouth daily.      . tamoxifen (NOLVADEX) 20 MG tablet TAKE 1 TABLET (20 MG TOTAL) BY MOUTH DAILY.  30 tablet  2  . [DISCONTINUED] cetirizine (ZYRTEC) 5 MG tablet Take 1 tablet (5 mg total) by mouth daily. Take at night time as it can cause some drowsiness  30 tablet  4   No current facility-administered medications for this visit.    REVIEW OF SYSTEMS:   Constitutional: Denies fevers, chills or abnormal weight loss Eyes: Denies blurriness of vision Ears, nose, mouth, throat, and face: Denies mucositis or sore throat Respiratory: Denies cough, dyspnea or wheezes Cardiovascular: Denies palpitation, chest discomfort or lower extremity  swelling Gastrointestinal:  Denies nausea, heartburn or change in bowel habits Skin: Denies abnormal skin rashes Lymphatics: Denies new lymphadenopathy or easy bruising Neurological:Denies numbness, tingling or new weaknesses Behavioral/Psych: Mood is stable, no new changes  All other systems were reviewed with the patient and are negative.  PHYSICAL EXAMINATION: ECOG PERFORMANCE STATUS: 0 - Asymptomatic  Filed Vitals:   12/11/13 1311  BP: 155/55  Pulse: 81  Temp: 98.1 F (36.7 C)  Resp: 18   Filed Weights   12/11/13 1311  Weight: 124 lb (56.246 kg)    GENERAL:alert, no distress and comfortable SKIN: skin color, texture, turgor are normal, no rashes or significant lesions EYES: normal, Conjunctiva are pink and non-injected, sclera clear OROPHARYNX:no exudate, no erythema and lips, buccal mucosa, and tongue normal  NECK: supple, thyroid normal size, non-tender, without nodularity LYMPH:  no palpable lymphadenopathy in the cervical, axillary or inguinal LUNGS: clear to auscultation and percussion with normal breathing effort HEART: regular rate & rhythm and no murmurs and no lower extremity edema ABDOMEN:abdomen soft, non-tender and normal bowel sounds Musculoskeletal:no cyanosis of digits and no clubbing  NEURO: alert & oriented x 3 with fluent speech, no focal motor/sensory deficits Bilateral chest wall examination showed well-healed mastectomy scar with no palpable abnormalities LABORATORY DATA:  I have reviewed the data as listed    Component Value Date/Time   NA 141 12/11/2013 1253   NA 143 09/20/2013 0455   K 3.9 12/11/2013 1253   K 3.8 09/20/2013 0455   CL 109 09/20/2013 0455   CL 105 12/11/2012 1349   CO2 23 12/11/2013 1253   CO2 23 09/20/2013 0455   GLUCOSE 205* 12/11/2013 1253   GLUCOSE 127* 09/20/2013 0455   GLUCOSE 218* 12/11/2012 1349   BUN 12.7 12/11/2013 1253   BUN 11 09/20/2013 0455   CREATININE 1.0 12/11/2013 1253   CREATININE 0.79 09/20/2013 0455   CREATININE  1.07 04/23/2013 1650   CALCIUM 10.8* 12/11/2013 1253   CALCIUM 10.3 09/20/2013 0455   PROT 7.4 12/11/2013 1253   PROT 7.2 07/06/2013 1729   ALBUMIN 3.8 12/11/2013 1253   ALBUMIN 3.4* 07/06/2013 1729   AST 14 12/11/2013 1253   AST 13 07/06/2013 1729   ALT 9 12/11/2013 1253   ALT 7 07/06/2013 1729   ALKPHOS 49 12/11/2013 1253   ALKPHOS 34* 07/06/2013 1729   BILITOT 0.25 12/11/2013 1253   BILITOT 0.2* 07/06/2013 1729   GFRNONAA 73* 09/20/2013 0455   GFRAA 84* 09/20/2013 0455    No results found for this basename: SPEP, UPEP,  kappa and lambda light chains    Lab Results  Component Value Date   WBC 6.0 12/11/2013   NEUTROABS 3.8 12/11/2013   HGB 10.3* 12/11/2013   HCT 32.8* 12/11/2013   MCV 72.9* 12/11/2013  PLT 167 12/11/2013      Chemistry      Component Value Date/Time   NA 141 12/11/2013 1253   NA 143 09/20/2013 0455   K 3.9 12/11/2013 1253   K 3.8 09/20/2013 0455   CL 109 09/20/2013 0455   CL 105 12/11/2012 1349   CO2 23 12/11/2013 1253   CO2 23 09/20/2013 0455   BUN 12.7 12/11/2013 1253   BUN 11 09/20/2013 0455   CREATININE 1.0 12/11/2013 1253   CREATININE 0.79 09/20/2013 0455   CREATININE 1.07 04/23/2013 1650      Component Value Date/Time   CALCIUM 10.8* 12/11/2013 1253   CALCIUM 10.3 09/20/2013 0455   ALKPHOS 49 12/11/2013 1253   ALKPHOS 34* 07/06/2013 1729   AST 14 12/11/2013 1253   AST 13 07/06/2013 1729   ALT 9 12/11/2013 1253   ALT 7 07/06/2013 1729   BILITOT 0.25 12/11/2013 1253   BILITOT 0.2* 07/06/2013 1729     ASSESSMENT & PLAN:  #1 bilateral breast cancer She tolerated tamoxifen well. I recommend we'll continue until January 2017. I will see her in 6 months with history, physical examination. I recommend genetic counseling given history of bilateral breast cancer and strong family history of cancer and she agreed to proceed. #2 chronic microcytic anemia I suspect the patient had undiagnosed hemoglobinopathy. I will order that for her next visit. #3 history of colon cancer I will  follow her tumor markers. She has been treated more than 10 years ago with no signs and symptoms of disease recurrence. Her last colonoscopy 2 years ago was negative for recurrence.  Orders Placed This Encounter  Procedures  . CBC with Differential    Standing Status: Future     Number of Occurrences:      Standing Expiration Date: 12/11/2014  . Comprehensive metabolic panel    Standing Status: Future     Number of Occurrences:      Standing Expiration Date: 12/11/2014  . Ferritin    Standing Status: Future     Number of Occurrences:      Standing Expiration Date: 12/11/2014  . CEA    Standing Status: Future     Number of Occurrences:      Standing Expiration Date: 12/11/2014  . Ambulatory referral to Genetics    Referral Priority:  Routine    Referral Type:  Consultation    Referral Reason:  Specialty Services Required    Requested Specialty:  Genetics    Number of Visits Requested:  1   All questions were answered. The patient knows to call the clinic with any problems, questions or concerns. No barriers to learning was detected.    [facility], [place], MD 12/11/2013 5:55 PM

## 2013-12-11 NOTE — Telephone Encounter (Signed)
gv and printed appt sched and avs for pt for July...pt did not want to sched genetics due to problems with transportation

## 2013-12-18 ENCOUNTER — Ambulatory Visit (INDEPENDENT_AMBULATORY_CARE_PROVIDER_SITE_OTHER): Payer: Medicare Other | Admitting: Family Medicine

## 2013-12-18 ENCOUNTER — Encounter: Payer: Self-pay | Admitting: Family Medicine

## 2013-12-18 VITALS — BP 180/78 | HR 87 | Temp 98.4°F | Wt 122.0 lb

## 2013-12-18 DIAGNOSIS — M545 Low back pain, unspecified: Secondary | ICD-10-CM | POA: Diagnosis not present

## 2013-12-18 DIAGNOSIS — I10 Essential (primary) hypertension: Secondary | ICD-10-CM

## 2013-12-18 DIAGNOSIS — R609 Edema, unspecified: Secondary | ICD-10-CM

## 2013-12-18 DIAGNOSIS — R600 Localized edema: Secondary | ICD-10-CM

## 2013-12-18 MED ORDER — AMLODIPINE BESYLATE 10 MG PO TABS: 10.0000 mg | ORAL_TABLET | Freq: Every day | ORAL | Status: DC

## 2013-12-18 MED ORDER — HYDROCODONE-ACETAMINOPHEN 5-325 MG PO TABS: 1.0000 | ORAL_TABLET | Freq: Every day | ORAL | Status: DC | PRN

## 2013-12-18 NOTE — Progress Notes (Signed)
Patient ID: [name]    DOB: [birth year], 80 y.o.   MRN: [id number] --- Subjective:  [name] is a 80 y.o.female who presents with left leg swelling. Started over the weekedn. No pain, but feeling of pressure across front of ankle. No trauma to the ankle. She did feel her knee pop a few hours after the swelling started. No pain in knee currently. She denies any recent long travel or hospitalization. She states that swelling is better in the morning and worst at the end of the day.  She denies any shortness of breath, chest pain or palpitations.   ROS: see HPI Past Medical History: reviewed and updated medications and allergies. Social History: Tobacco: chews tobacco  Objective: Filed Vitals:   12/18/13 1120  BP: 180/78  Pulse: 87  Temp: 98.4 F (36.9 C)   Repeat BP: 140/68  Physical Examination:   General appearance - alert, well appearing, and in no distress Chest - clear to auscultation, no wheezes, rales or rhonchi, symmetric air entry Heart - normal rate, regular rhythm, normal S1, S2, 2/6 systolic murmur best heard at right sternal border Extremities - left leg: soft tissue swelling across anterior aspect of ankle compared to right, non tender, decreased range of motion of both ankles. No warmth, no erythema Negative Homan's, normal strength with foot dorsiflexion and plantarflexion Dorsalis pedis pulses difficult to palpate.  36cm calf diameter bilaterally

## 2013-12-18 NOTE — Assessment & Plan Note (Addendum)
Doesn't appear to be gout since no tenderness, no warmth or erythema.  Patient is on amlodipine which could cause lower extremity swelling but less likely since unilateral in this case Kidney function normal making nephrotic syndrome unlikely. CHF stable. Anemia which could also contribute to edema is stable. - although low likelihood will obtain lower extremity ultrasound to rule out DVT since patient does have a history of breast CA - will also check ankle xray to rule out fracture as etiology - compression stockings and elevation - follow up in 2 weeks

## 2013-12-18 NOTE — Patient Instructions (Signed)
For the swelling, we are going to make sure you don't have a clot in your leg or a broken bone that could be causing the swelling.  In the meantime, keep your legs up and wear the compression stockings.  Follow up with me in 2 weeks.

## 2013-12-19 ENCOUNTER — Telehealth: Payer: Self-pay | Admitting: Family Medicine

## 2013-12-19 ENCOUNTER — Ambulatory Visit (HOSPITAL_COMMUNITY)
Admission: RE | Admit: 2013-12-19 | Discharge: 2013-12-19 | Disposition: A | Discharge status: Home / Self Care | Payer: Medicare Other | Source: Ambulatory Visit | Attending: Family Medicine | Admitting: Family Medicine

## 2013-12-19 DIAGNOSIS — M25473 Effusion, unspecified ankle: Secondary | ICD-10-CM | POA: Diagnosis not present

## 2013-12-19 DIAGNOSIS — M7989 Other specified soft tissue disorders: Secondary | ICD-10-CM

## 2013-12-19 DIAGNOSIS — M25579 Pain in unspecified ankle and joints of unspecified foot: Secondary | ICD-10-CM | POA: Insufficient documentation

## 2013-12-19 DIAGNOSIS — M25476 Effusion, unspecified foot: Secondary | ICD-10-CM | POA: Diagnosis not present

## 2013-12-19 DIAGNOSIS — R600 Localized edema: Secondary | ICD-10-CM

## 2013-12-19 NOTE — Telephone Encounter (Signed)
CALLED PT INFORMED.[name]

## 2013-12-19 NOTE — Progress Notes (Signed)
*  Preliminary Results* Left lower extremity venous duplex completed. Left lower extremity is negative for deep vein thrombosis. There is no evidence of left Baker's cyst.  12/19/2013 12:04 PM  Maudry Mayhew, RVT, RDCS, RDMS

## 2013-12-19 NOTE — Assessment & Plan Note (Signed)
Repeat BP improved.  Continue current medications

## 2013-12-19 NOTE — Telephone Encounter (Signed)
Please let patient know that the ultrasound and xray were normal and did not show any sign of clot or fracture.  She can continue elevating legs and using compression for now. She should follow up in 1-2 weeks to see how the swelling is going.   Liam Graham, PGY-3 Family Medicine Resident

## 2014-01-14 ENCOUNTER — Encounter: Payer: Self-pay | Admitting: Family Medicine

## 2014-01-14 ENCOUNTER — Ambulatory Visit (INDEPENDENT_AMBULATORY_CARE_PROVIDER_SITE_OTHER): Payer: Medicare Other | Admitting: Family Medicine

## 2014-01-14 VITALS — BP 170/57 | HR 72 | Temp 98.5°F | Ht 59.0 in | Wt 121.6 lb

## 2014-01-14 DIAGNOSIS — I5032 Chronic diastolic (congestive) heart failure: Secondary | ICD-10-CM

## 2014-01-14 DIAGNOSIS — R609 Edema, unspecified: Secondary | ICD-10-CM

## 2014-01-14 DIAGNOSIS — I5022 Chronic systolic (congestive) heart failure: Secondary | ICD-10-CM

## 2014-01-14 DIAGNOSIS — M545 Low back pain, unspecified: Secondary | ICD-10-CM | POA: Diagnosis not present

## 2014-01-14 DIAGNOSIS — R600 Localized edema: Secondary | ICD-10-CM

## 2014-01-14 LAB — IRON AND TIBC
%SAT: 19 % — AB (ref 20–55)
IRON: 55 ug/dL (ref 42–145)
TIBC: 290 ug/dL (ref 250–470)
UIBC: 235 ug/dL (ref 125–400)

## 2014-01-14 LAB — FERRITIN: Ferritin: 322 ng/mL — ABNORMAL HIGH (ref 10–291)

## 2014-01-14 MED ORDER — POTASSIUM CHLORIDE ER 10 MEQ PO TBCR: 10.0000 meq | EXTENDED_RELEASE_TABLET | Freq: Every day | ORAL | Status: DC

## 2014-01-14 MED ORDER — FUROSEMIDE 20 MG PO TABS: 20.0000 mg | ORAL_TABLET | Freq: Every day | ORAL | Status: DC

## 2014-01-14 MED ORDER — HYDROCODONE-ACETAMINOPHEN 5-325 MG PO TABS: 1.0000 | ORAL_TABLET | Freq: Every day | ORAL | Status: DC | PRN

## 2014-01-14 NOTE — Patient Instructions (Signed)
Please make an appointment with the pharmacists for ABI measurements.   Follow up with me in 2 weeks.

## 2014-01-14 NOTE — Progress Notes (Signed)
Patient ID: [name]    DOB: 1923-11-29, 80 y.o.   MRN: [id number] --- Subjective:  [name] is a 80 y.o.female who presents for follow up on left leg swelling.  She was seen a couple of weeks ago for left leg swelling that had started a couple of days prior to that. She had lower extremity venous doppler to rule out DVT ankle xray to rule out fracture. Xray showed marked soft tissue swelling but no fracture. Venous doppler was negative for DVT. Since then, patient continues to experience lower extremity swelling left more than right. She had some pain in the back of her thigh and knee on Saturday and Sunday wich has since resolved. She feels some achiness in her foot but no pain. She has not been wearing compression stockings. She feels like swelling is better when she gets up in the morning. She feels some shortness of breath when she exerts herself. She feels more tired than usual and has trouble walking given the swelling.   ROS: see HPI Past Medical History: reviewed and updated medications and allergies. Social History: Tobacco: chews tobacco  Objective: Filed Vitals:   01/14/14 1200  BP: 170/57  Pulse: 72  Temp: 98.5 F (36.9 C)    Physical Examination:   General appearance - alert, well appearing, and in no distress Chest - clear to auscultation, no wheezes, rales or rhonchi, symmetric air entry Heart - normal rate, regular rhythm, normal S1, S2, no murmurs, rubs, clicks or gallops Extremities - dorsalis pedis pulse difficult to palpate due to edema.  Soft tissue swelling more pronounced around left ankle than right but present on right nonetheless.  2+ pedal edema bilaterally. Equal calf width bilaterally, no calf tenderness, no popliteal swelling or mass.  No tenderness, warmth or redness along ankle joint or medial and lateral malleoli.

## 2014-01-14 NOTE — Assessment & Plan Note (Addendum)
No fracture on xray. No DVT. Normal renal and hepatic function.  Differential includes worsening heart failure vs anemia vs gout vs pelvic obstruction vs venous insufficiency.  Heart failure is possible. Will recheck echo. Will empirically try lasix 20mg  daily with KDur 10 in setting of diastolic heart failure.  Doesn't appear to be gout since absence of tenderness, warmth and erythema.  In case anemia is contributing, will check ferritin, tibc and iron  Check ABI's to rule out arterial deficiency prior to using compression stockings.  Will hold on pelvic/abdominal CT since there does appear to be some right sided swelling present in addition to left.

## 2014-01-20 ENCOUNTER — Ambulatory Visit (HOSPITAL_COMMUNITY)
Admission: RE | Admit: 2014-01-20 | Discharge: 2014-01-20 | Disposition: A | Discharge status: Home / Self Care | Payer: Medicare Other | Source: Ambulatory Visit | Attending: Family Medicine | Admitting: Family Medicine

## 2014-01-20 ENCOUNTER — Encounter (HOSPITAL_COMMUNITY): Payer: Self-pay | Admitting: Emergency Medicine

## 2014-01-20 ENCOUNTER — Emergency Department (INDEPENDENT_AMBULATORY_CARE_PROVIDER_SITE_OTHER): Payer: Medicare Other

## 2014-01-20 ENCOUNTER — Emergency Department (INDEPENDENT_AMBULATORY_CARE_PROVIDER_SITE_OTHER)
Admission: EM | Admit: 2014-01-20 | Discharge: 2014-01-20 | Disposition: A | Discharge status: Home / Self Care | Payer: Medicare Other | Source: Home / Self Care | Attending: Family Medicine | Admitting: Family Medicine

## 2014-01-20 ENCOUNTER — Telehealth: Payer: Self-pay | Admitting: Family Medicine

## 2014-01-20 DIAGNOSIS — M25559 Pain in unspecified hip: Secondary | ICD-10-CM | POA: Diagnosis not present

## 2014-01-20 DIAGNOSIS — R05 Cough: Secondary | ICD-10-CM | POA: Diagnosis not present

## 2014-01-20 DIAGNOSIS — I5032 Chronic diastolic (congestive) heart failure: Secondary | ICD-10-CM | POA: Diagnosis not present

## 2014-01-20 DIAGNOSIS — I10 Essential (primary) hypertension: Secondary | ICD-10-CM | POA: Diagnosis not present

## 2014-01-20 DIAGNOSIS — R059 Cough, unspecified: Secondary | ICD-10-CM | POA: Diagnosis not present

## 2014-01-20 DIAGNOSIS — I359 Nonrheumatic aortic valve disorder, unspecified: Secondary | ICD-10-CM

## 2014-01-20 DIAGNOSIS — M169 Osteoarthritis of hip, unspecified: Secondary | ICD-10-CM | POA: Diagnosis not present

## 2014-01-20 DIAGNOSIS — M1610 Unilateral primary osteoarthritis, unspecified hip: Secondary | ICD-10-CM | POA: Diagnosis not present

## 2014-01-20 DIAGNOSIS — R0602 Shortness of breath: Secondary | ICD-10-CM | POA: Diagnosis not present

## 2014-01-20 MED ORDER — HYDROCODONE-ACETAMINOPHEN 5-325 MG PO TABS: 1.0000 | ORAL_TABLET | Freq: Four times a day (QID) | ORAL | Status: DC | PRN

## 2014-01-20 NOTE — Discharge Instructions (Signed)
Use pain medicine as needed, rest, activity as tolerated, see orthopedist if further problems.

## 2014-01-20 NOTE — Progress Notes (Signed)
*  PRELIMINARY RESULTS* Echocardiogram 2D Echocardiogram has been performed.  Leavy Cella 01/20/2014, 10:45 AM

## 2014-01-20 NOTE — ED Notes (Signed)
Pt  Reports   Got  Out of  Bed  Yesterday  And   Developed  r  Hip  Pain  She  denys  Any  Injury  She  Reports  Pain  When  She  Bears  Weight

## 2014-01-20 NOTE — Telephone Encounter (Signed)
Please let Ms. Enneking know that the ultrasound of her heart shows that her heart muscles squeeze normally and have just a little more trouble relaxing, but not excessively. Overall, it looked good and likely doesn't explain her swelling.   Thank you!  Liam Graham, PGY-3 Family Medicine Resident

## 2014-01-20 NOTE — ED Provider Notes (Signed)
CSN: [id number]     Arrival date & time 01/20/14  1025 History   First MD Initiated Contact with Patient 01/20/14 1132     Chief Complaint  Patient presents with  . Hip Pain   (Consider location/radiation/quality/duration/timing/severity/associated sxs/prior Treatment) Patient is a 80 y.o. female presenting with hip pain. The history is provided by the patient.  Hip Pain This is a new problem. The current episode started yesterday (pain right hip area getting out of bed yest., sl pain with walking.). The problem has not changed since onset.Pertinent negatives include no chest pain and no abdominal pain.    Past Medical History  Diagnosis Date  . Breast cancer 10/2010    Invasive Ductal Carcinoma, s/p bilateral mastectomy, now on Tamoxifen. Followed by Dr Jamse Arn.   . Diabetes mellitus   . Hypertension   . Hyperlipidemia   . Anemia     Iron def anemia  . CAD (coronary artery disease)   . CHF (congestive heart failure)     Systolic   . History of colon cancer   . Neuropathy   . Allergy   . GERD (gastroesophageal reflux disease)   . Hiatal hernia   . Diverticulosis   . Asthma   . Arthritis   . H/O: GI bleed   . Breast cancer 2011    s/p Bilateral masectomy  . Shortness of breath   . Pneumonia 03/2012  . Carotid artery aneurysm 08/2010    right ICA, 5 x 62mm  . DDD (degenerative disc disease), cervical   . Colon cancer   . Colon cancer 12/11/2013   Past Surgical History  Procedure Laterality Date  . Breast masectomy  10/2010    Bilateral masectomy by Dr Lucia Gaskins s/p invasive ductal carcinoma.  . Cardiac  catherization  2007    Severe 3-vessel disease.  EF 20-25%.  . Esophagogastroduodenoscopy  2001    Esophageal Tear  . Total knee arthroplasty  2001  . Esophagogastroduodenoscopy  10/27/2012    Procedure: ESOPHAGOGASTRODUODENOSCOPY (EGD);  Surgeon: Irene Shipper, MD;  Location: [facility] ENDOSCOPY;  Service: Endoscopy;  Laterality: N/A;  pat  . Breast surgery     Family History   Problem Relation Age of Onset  . Sickle cell anemia Other   . Heart disease Mother   . Heart disease Father   . Cancer Daughter 19    breast ca  . Cancer Daughter 76    breast ca   History  Substance Use Topics  . Smoking status: Never Smoker   . Smokeless tobacco: Current User    Types: Snuff  . Alcohol Use: No   OB History   Grav Para Term Preterm Abortions TAB SAB Ect Mult Living                 Review of Systems  Constitutional: Negative.   Cardiovascular: Negative for chest pain.  Gastrointestinal: Negative.  Negative for abdominal pain.  Musculoskeletal: Positive for gait problem. Negative for back pain and joint swelling.  Skin: Negative.     Allergies  Peanuts and Lisinopril  Home Medications   Current Outpatient Rx  Name  Route  Sig  Dispense  Refill  . acetaminophen (TYLENOL) 325 MG tablet   Oral   Take 2 tablets (650 mg total) by mouth every 6 (six) hours as needed for moderate pain.   60 tablet   1   . albuterol (PROVENTIL HFA;VENTOLIN HFA) 108 (90 BASE) MCG/ACT inhaler   Inhalation   Inhale 2  puffs into the lungs every 6 (six) hours as needed. For shortness of breath   1 Inhaler   6   . amLODipine (NORVASC) 10 MG tablet   Oral   Take 1 tablet (10 mg total) by mouth daily.   30 tablet   3   . aspirin (ASPIRIN CHILDRENS) 81 MG chewable tablet   Oral   Chew 81 mg by mouth daily.           . carvedilol (COREG) 12.5 MG tablet      TAKE 1 TABLET (12.5 MG TOTAL) BY MOUTH 2 (TWO) TIMES DAILY WITH A MEAL.   60 tablet   3   . colchicine 0.6 MG tablet   Oral   Take 1 tablet (0.6 mg total) by mouth once. Take 2 tablets on 1 day, if still having pain in an hour, take 1 more tablet on day 1. After that take 2 tablets daily for 2 weeks then 1 tablet daily. See [place] back in 1 week if not improving or if you develop fevers.   60 tablet   0   . furosemide (LASIX) 20 MG tablet   Oral   Take 1 tablet (20 mg total) by mouth daily.   30 tablet   3    . HYDROcodone-acetaminophen (NORCO/VICODIN) 5-325 MG per tablet   Oral   Take 1 tablet by mouth daily as needed. Do not fill before 01/23/14   30 tablet   0   . HYDROcodone-acetaminophen (NORCO/VICODIN) 5-325 MG per tablet   Oral   Take 1 tablet by mouth every 6 (six) hours as needed.   10 tablet   0   . Incontinence Supply Disposable (ASSURANCE UNDERWEAR WOMENS) MISC   Does not apply   1 application by Does not apply route daily.   48 each   6   . losartan (COZAAR) 50 MG tablet   Oral   Take 1 tablet (50 mg total) by mouth daily.   30 tablet   4   . magnesium hydroxide (MILK OF MAGNESIA) 400 MG/5ML suspension   Oral   Take 30 mLs by mouth daily as needed for mild constipation.         . metFORMIN (GLUCOPHAGE) 500 MG tablet   Oral   Take 1 tablet (500 mg total) by mouth 2 (two) times daily with a meal.   60 tablet   5   . mirtazapine (REMERON) 15 MG tablet      TAKE 1 TABLET (15 MG TOTAL) BY MOUTH AT BEDTIME.   30 tablet   3   . PATADAY 0.2 % SOLN   Both Eyes   Place 1 drop into both eyes daily as needed. For itchy eyes         . potassium chloride (K-DUR) 10 MEQ tablet   Oral   Take 1 tablet (10 mEq total) by mouth daily. Take with the lasix   30 tablet   0   . ranitidine (ZANTAC) 150 MG capsule   Oral   Take 150 mg by mouth 2 (two) times daily as needed for heartburn.         . rosuvastatin (CRESTOR) 20 MG tablet   Oral   Take 20 mg by mouth daily.         . tamoxifen (NOLVADEX) 20 MG tablet   Oral   Take 20 mg by mouth daily.         . tamoxifen (NOLVADEX) 20 MG tablet  TAKE 1 TABLET (20 MG TOTAL) BY MOUTH DAILY.   30 tablet   2    BP 148/61  Pulse 81  Temp(Src) 97.8 F (36.6 C) (Oral)  Resp 12  SpO2 100% Physical Exam  Nursing note and vitals reviewed. Constitutional: She is oriented to person, place, and time. She appears well-developed and well-nourished. No distress.  Abdominal: There is no tenderness.   Musculoskeletal: She exhibits tenderness.       Right hip: She exhibits decreased range of motion, tenderness and bony tenderness. She exhibits no swelling, no crepitus and no deformity.  Neurological: She is alert and oriented to person, place, and time.  Skin: Skin is warm and dry.    ED Course  Procedures (including critical care time) Labs Review Labs Reviewed - No data to display Imaging Review Dg Hip Complete Right  01/20/2014   CLINICAL DATA:  Pain in the right hip  EXAM: RIGHT HIP - COMPLETE 2+ VIEW  COMPARISON:  08/28/2011  FINDINGS: Stable mild degenerative changes in both hips. Postoperative changes, with suture material noted in the central pelvis, stable. No acute fracture is identified. Both hips are located. There are degenerative changes in the lower lumbar spine. Pelvic phleboliths are stable.  IMPRESSION: No acute bony abnormality identified. Stable degenerative changes of the hips and lower lumbar spine.   Electronically Signed   By: Curlene Dolphin M.D.   On: 01/20/2014 12:17   X-rays reviewed and report per radiologist.   MDM   1. Hip pain, acute        Billy Fischer, MD 01/20/14 1309

## 2014-01-21 ENCOUNTER — Encounter (HOSPITAL_COMMUNITY): Payer: Self-pay | Admitting: Emergency Medicine

## 2014-01-21 ENCOUNTER — Emergency Department (HOSPITAL_COMMUNITY): Payer: Medicare Other

## 2014-01-21 ENCOUNTER — Emergency Department (HOSPITAL_COMMUNITY)
Admission: EM | Admit: 2014-01-21 | Discharge: 2014-01-21 | Disposition: A | Discharge status: Home / Self Care | Payer: Medicare Other | Attending: Emergency Medicine | Admitting: Emergency Medicine

## 2014-01-21 DIAGNOSIS — Z8701 Personal history of pneumonia (recurrent): Secondary | ICD-10-CM | POA: Insufficient documentation

## 2014-01-21 DIAGNOSIS — M129 Arthropathy, unspecified: Secondary | ICD-10-CM | POA: Insufficient documentation

## 2014-01-21 DIAGNOSIS — R05 Cough: Secondary | ICD-10-CM | POA: Diagnosis not present

## 2014-01-21 DIAGNOSIS — Z7982 Long term (current) use of aspirin: Secondary | ICD-10-CM | POA: Diagnosis not present

## 2014-01-21 DIAGNOSIS — R059 Cough, unspecified: Secondary | ICD-10-CM | POA: Diagnosis not present

## 2014-01-21 DIAGNOSIS — I10 Essential (primary) hypertension: Secondary | ICD-10-CM | POA: Diagnosis not present

## 2014-01-21 DIAGNOSIS — E785 Hyperlipidemia, unspecified: Secondary | ICD-10-CM | POA: Insufficient documentation

## 2014-01-21 DIAGNOSIS — K219 Gastro-esophageal reflux disease without esophagitis: Secondary | ICD-10-CM | POA: Insufficient documentation

## 2014-01-21 DIAGNOSIS — J3489 Other specified disorders of nose and nasal sinuses: Secondary | ICD-10-CM | POA: Insufficient documentation

## 2014-01-21 DIAGNOSIS — I509 Heart failure, unspecified: Secondary | ICD-10-CM | POA: Insufficient documentation

## 2014-01-21 DIAGNOSIS — Z8669 Personal history of other diseases of the nervous system and sense organs: Secondary | ICD-10-CM | POA: Diagnosis not present

## 2014-01-21 DIAGNOSIS — E119 Type 2 diabetes mellitus without complications: Secondary | ICD-10-CM | POA: Diagnosis not present

## 2014-01-21 DIAGNOSIS — I2510 Atherosclerotic heart disease of native coronary artery without angina pectoris: Secondary | ICD-10-CM | POA: Diagnosis not present

## 2014-01-21 DIAGNOSIS — Z79899 Other long term (current) drug therapy: Secondary | ICD-10-CM | POA: Insufficient documentation

## 2014-01-21 DIAGNOSIS — Z9889 Other specified postprocedural states: Secondary | ICD-10-CM | POA: Diagnosis not present

## 2014-01-21 DIAGNOSIS — Z85038 Personal history of other malignant neoplasm of large intestine: Secondary | ICD-10-CM | POA: Diagnosis not present

## 2014-01-21 DIAGNOSIS — Z862 Personal history of diseases of the blood and blood-forming organs and certain disorders involving the immune mechanism: Secondary | ICD-10-CM | POA: Insufficient documentation

## 2014-01-21 DIAGNOSIS — Z853 Personal history of malignant neoplasm of breast: Secondary | ICD-10-CM | POA: Insufficient documentation

## 2014-01-21 DIAGNOSIS — R0602 Shortness of breath: Secondary | ICD-10-CM | POA: Diagnosis not present

## 2014-01-21 DIAGNOSIS — J45901 Unspecified asthma with (acute) exacerbation: Secondary | ICD-10-CM | POA: Diagnosis not present

## 2014-01-21 DIAGNOSIS — R51 Headache: Secondary | ICD-10-CM | POA: Diagnosis not present

## 2014-01-21 DIAGNOSIS — R5381 Other malaise: Secondary | ICD-10-CM | POA: Diagnosis not present

## 2014-01-21 DIAGNOSIS — R404 Transient alteration of awareness: Secondary | ICD-10-CM | POA: Diagnosis not present

## 2014-01-21 LAB — CBC
HCT: 30.6 % — ABNORMAL LOW (ref 36.0–46.0)
Hemoglobin: 10.1 g/dL — ABNORMAL LOW (ref 12.0–15.0)
MCH: 23.1 pg — ABNORMAL LOW (ref 26.0–34.0)
MCHC: 33 g/dL (ref 30.0–36.0)
MCV: 69.9 fL — ABNORMAL LOW (ref 78.0–100.0)
Platelets: 152 10*3/uL (ref 150–400)
RBC: 4.38 MIL/uL (ref 3.87–5.11)
RDW: 15.4 % (ref 11.5–15.5)
WBC: 4.8 10*3/uL (ref 4.0–10.5)

## 2014-01-21 LAB — BASIC METABOLIC PANEL
BUN: 17 mg/dL (ref 6–23)
CO2: 23 mEq/L (ref 19–32)
Calcium: 10.8 mg/dL — ABNORMAL HIGH (ref 8.4–10.5)
Chloride: 98 mEq/L (ref 96–112)
Creatinine, Ser: 0.94 mg/dL (ref 0.50–1.10)
GFR, EST AFRICAN AMERICAN: 61 mL/min — AB (ref >90)
GFR, EST NON AFRICAN AMERICAN: 53 mL/min — AB (ref >90)
Glucose, Bld: 253 mg/dL — ABNORMAL HIGH (ref 70–99)
POTASSIUM: 3.9 meq/L (ref 3.7–5.3)
SODIUM: 136 meq/L — AB (ref 137–147)

## 2014-01-21 LAB — PRO B NATRIURETIC PEPTIDE: PRO B NATRI PEPTIDE: 205.1 pg/mL (ref 0–450)

## 2014-01-21 LAB — I-STAT TROPONIN, ED: TROPONIN I, POC: 0 ng/mL (ref 0.00–0.08)

## 2014-01-21 MED ORDER — SODIUM CHLORIDE 0.9 % IV BOLUS (SEPSIS)
500.0000 mL | Freq: Once | INTRAVENOUS | Status: AC
  Administered 2014-01-21: 500 mL via INTRAVENOUS

## 2014-01-21 NOTE — ED Notes (Addendum)
Pt ambulated to restroom and back with x 1 assist.  Pt O2 sats 96%-99%.

## 2014-01-21 NOTE — ED Provider Notes (Signed)
CSN: [id number]     Arrival date & time 01/21/14  1156 History   First MD Initiated Contact with Patient 01/21/14 1204     Chief Complaint  Patient presents with  . Cough  . Shortness of Breath     (Consider location/radiation/quality/duration/timing/severity/associated sxs/prior Treatment) Patient is a 80 y.o. female presenting with cough.  Cough Cough characteristics:  Productive Sputum characteristics:  Yellow Severity:  Moderate Onset quality:  Gradual Duration:  2 days Timing:  Constant Progression:  Worsening Chronicity:  New Relieved by:  Nothing Worsened by:  Nothing tried Associated symptoms: headaches, rhinorrhea, shortness of breath (chronic, but worse) and sinus congestion   Associated symptoms: no chest pain     Past Medical History  Diagnosis Date  . Breast cancer 10/2010    Invasive Ductal Carcinoma, s/p bilateral mastectomy, now on Tamoxifen. Followed by Dr Jamse Arn.   . Diabetes mellitus   . Hypertension   . Hyperlipidemia   . Anemia     Iron def anemia  . CAD (coronary artery disease)   . CHF (congestive heart failure)     Systolic   . History of colon cancer   . Neuropathy   . Allergy   . GERD (gastroesophageal reflux disease)   . Hiatal hernia   . Diverticulosis   . Asthma   . Arthritis   . H/O: GI bleed   . Breast cancer 2011    s/p Bilateral masectomy  . Shortness of breath   . Pneumonia 03/2012  . Carotid artery aneurysm 08/2010    right ICA, 5 x 70mm  . DDD (degenerative disc disease), cervical   . Colon cancer   . Colon cancer 12/11/2013   Past Surgical History  Procedure Laterality Date  . Breast masectomy  10/2010    Bilateral masectomy by Dr Lucia Gaskins s/p invasive ductal carcinoma.  . Cardiac  catherization  2007    Severe 3-vessel disease.  EF 20-25%.  . Esophagogastroduodenoscopy  2001    Esophageal Tear  . Total knee arthroplasty  2001  . Esophagogastroduodenoscopy  10/27/2012    Procedure: ESOPHAGOGASTRODUODENOSCOPY (EGD);   Surgeon: Irene Shipper, MD;  Location: [facility] ENDOSCOPY;  Service: Endoscopy;  Laterality: N/A;  pat  . Breast surgery     Family History  Problem Relation Age of Onset  . Sickle cell anemia Other   . Heart disease Mother   . Heart disease Father   . Cancer Daughter 71    breast ca  . Cancer Daughter 41    breast ca   History  Substance Use Topics  . Smoking status: Never Smoker   . Smokeless tobacco: Current User    Types: Snuff  . Alcohol Use: No   OB History   Grav Para Term Preterm Abortions TAB SAB Ect Mult Living                 Review of Systems  HENT: Positive for rhinorrhea.   Respiratory: Positive for cough and shortness of breath (chronic, but worse).   Cardiovascular: Negative for chest pain.  Neurological: Positive for headaches.  All other systems reviewed and are negative.      Allergies  Peanuts and Lisinopril  Home Medications   Current Outpatient Rx  Name  Route  Sig  Dispense  Refill  . acetaminophen (TYLENOL) 325 MG tablet   Oral   Take 2 tablets (650 mg total) by mouth every 6 (six) hours as needed for moderate pain.   60 tablet  1   . albuterol (PROVENTIL HFA;VENTOLIN HFA) 108 (90 BASE) MCG/ACT inhaler   Inhalation   Inhale 2 puffs into the lungs every 6 (six) hours as needed. For shortness of breath   1 Inhaler   6   . amLODipine (NORVASC) 10 MG tablet   Oral   Take 1 tablet (10 mg total) by mouth daily.   30 tablet   3   . aspirin (ASPIRIN CHILDRENS) 81 MG chewable tablet   Oral   Chew 81 mg by mouth daily.           . carvedilol (COREG) 12.5 MG tablet   Oral   Take 12.5 mg by mouth 2 (two) times daily with a meal.         . furosemide (LASIX) 20 MG tablet   Oral   Take 1 tablet (20 mg total) by mouth daily.   30 tablet   3   . HYDROcodone-acetaminophen (NORCO/VICODIN) 5-325 MG per tablet   Oral   Take 1 tablet by mouth every 6 (six) hours as needed for moderate pain.         Marland Kitchen losartan (COZAAR) 50 MG tablet    Oral   Take 1 tablet (50 mg total) by mouth daily.   30 tablet   4   . magnesium hydroxide (MILK OF MAGNESIA) 400 MG/5ML suspension   Oral   Take 30 mLs by mouth daily as needed for mild constipation.         . metFORMIN (GLUCOPHAGE) 500 MG tablet   Oral   Take 1 tablet (500 mg total) by mouth 2 (two) times daily with a meal.   60 tablet   5   . mirtazapine (REMERON) 15 MG tablet   Oral   Take 15 mg by mouth at bedtime.         Marland Kitchen PATADAY 0.2 % SOLN   Both Eyes   Place 1 drop into both eyes daily as needed. For itchy eyes         . potassium chloride (K-DUR) 10 MEQ tablet   Oral   Take 1 tablet (10 mEq total) by mouth daily. Take with the lasix   30 tablet   0   . ranitidine (ZANTAC) 150 MG capsule   Oral   Take 150 mg by mouth 2 (two) times daily as needed for heartburn.         . rosuvastatin (CRESTOR) 20 MG tablet   Oral   Take 20 mg by mouth daily.         . tamoxifen (NOLVADEX) 20 MG tablet   Oral   Take 20 mg by mouth daily.          BP 142/51  Pulse 83  Temp(Src) 98.7 F (37.1 C) (Oral)  Resp 17  SpO2 99% Physical Exam  Nursing note and vitals reviewed. Constitutional: She is oriented to person, place, and time. She appears well-developed and well-nourished. No distress.  HENT:  Head: Normocephalic and atraumatic.  Mouth/Throat: Oropharynx is clear and moist.  Eyes: Conjunctivae are normal. Pupils are equal, round, and reactive to light. No scleral icterus.  Neck: Neck supple.  Cardiovascular: Normal rate, regular rhythm, normal heart sounds and intact distal pulses.   No murmur heard. Pulmonary/Chest: Effort normal and breath sounds normal. No accessory muscle usage or stridor. Not tachypneic. No respiratory distress. She has no decreased breath sounds. She has no wheezes. She has no rales.  Abdominal: Soft. Bowel sounds are normal.  She exhibits no distension. There is no tenderness.  Musculoskeletal: Normal range of motion.   Neurological: She is alert and oriented to person, place, and time.  Skin: Skin is warm and dry. No rash noted.  Psychiatric: She has a normal mood and affect. Her behavior is normal.    ED Course  Procedures (including critical care time) Labs Review Labs Reviewed  CBC - Abnormal; Notable for the following:    Hemoglobin 10.1 (*)    HCT 30.6 (*)    MCV 69.9 (*)    MCH 23.1 (*)    All other components within normal limits  BASIC METABOLIC PANEL - Abnormal; Notable for the following:    Sodium 136 (*)    Glucose, Bld 253 (*)    Calcium 10.8 (*)    GFR calc non Af Amer 53 (*)    GFR calc Af Amer 61 (*)    All other components within normal limits  PRO B NATRIURETIC PEPTIDE  I-STAT TROPOININ, ED   Imaging Review Dg Chest 2 View  01/21/2014   CLINICAL DATA Cough and short of breath  EXAM CHEST  2 VIEW  COMPARISON 09/19/2013  FINDINGS Mild right middle lobe scarring is unchanged. Negative for pneumonia. Negative for heart failure or effusion. Lungs are otherwise clear.  IMPRESSION No active cardiopulmonary disease.  SIGNATURE  Electronically Signed   By: Franchot Gallo M.D.   On: 01/21/2014 13:10   Dg Hip Complete Right  01/20/2014   CLINICAL DATA:  Pain in the right hip  EXAM: RIGHT HIP - COMPLETE 2+ VIEW  COMPARISON:  08/28/2011  FINDINGS: Stable mild degenerative changes in both hips. Postoperative changes, with suture material noted in the central pelvis, stable. No acute fracture is identified. Both hips are located. There are degenerative changes in the lower lumbar spine. Pelvic phleboliths are stable.  IMPRESSION: No acute bony abnormality identified. Stable degenerative changes of the hips and lower lumbar spine.   Electronically Signed   By: Curlene Dolphin M.D.   On: 01/20/2014 12:17  Today's plain films viewed by me.     EKG Interpretation   Date/Time:  Wednesday January 21 2014 12:05:31 EDT Ventricular Rate:  77 PR Interval:  165 QRS Duration: 73 QT Interval:  411 QTC  Calculation: 465 R Axis:   -4 Text Interpretation:  Sinus rhythm Probable left atrial enlargement  Probable left ventricular hypertrophy No significant change was found  Confirmed by [facility]  MD, TREY ([id number]) on 01/21/2014 5:00:51 PM      MDM   Final diagnoses:  Cough    80 yo female presenting with cough since last night.  She reports nasal congestion and symptoms consistent with postnasal drip.  On exam, well appearing in no respiratory distress.  CXR negative.  Ambulated without difficulty or hypoxia.  Orthostatics borderline, so given 500cc bolus and stated she felt better afterwards.  Likely has viral URI.  Advised supportive measures and close PCP follow up for recheck.      Houston Siren III, MD 01/21/14 2102

## 2014-01-21 NOTE — ED Notes (Signed)
Pt states she is too weak to walk

## 2014-01-21 NOTE — ED Notes (Addendum)
Pt O2 stats at 96-100% while ambulating

## 2014-01-21 NOTE — Discharge Instructions (Signed)

## 2014-01-21 NOTE — ED Notes (Signed)
Pt seen at [facility] yesterday for hip pain.  Pt states she had an echo done yesterday.  Pt states that yesterday she began having productive cough.  Pt reports she is vomiting, but she is spitting out sputum.  Per EMS, sputum is green.

## 2014-01-22 NOTE — Telephone Encounter (Signed)
Called pt and informed. .[name]  

## 2014-01-23 ENCOUNTER — Telehealth: Payer: Self-pay | Admitting: *Deleted

## 2014-01-23 NOTE — Telephone Encounter (Signed)
Spoke with patient and she stated she got her flu shot 3 months ago when a nurse had done a home visit.Rockne Coons

## 2014-01-25 ENCOUNTER — Emergency Department (HOSPITAL_COMMUNITY)
Admission: EM | Admit: 2014-01-25 | Discharge: 2014-01-25 | Disposition: A | Discharge status: Home / Self Care | Payer: Medicare Other | Attending: Emergency Medicine | Admitting: Emergency Medicine

## 2014-01-25 ENCOUNTER — Emergency Department (HOSPITAL_COMMUNITY): Payer: Medicare Other

## 2014-01-25 ENCOUNTER — Encounter (HOSPITAL_COMMUNITY): Payer: Self-pay | Admitting: Emergency Medicine

## 2014-01-25 DIAGNOSIS — M79609 Pain in unspecified limb: Secondary | ICD-10-CM | POA: Insufficient documentation

## 2014-01-25 DIAGNOSIS — Z85038 Personal history of other malignant neoplasm of large intestine: Secondary | ICD-10-CM | POA: Diagnosis not present

## 2014-01-25 DIAGNOSIS — M79671 Pain in right foot: Secondary | ICD-10-CM

## 2014-01-25 DIAGNOSIS — F411 Generalized anxiety disorder: Secondary | ICD-10-CM | POA: Diagnosis not present

## 2014-01-25 DIAGNOSIS — Z7982 Long term (current) use of aspirin: Secondary | ICD-10-CM | POA: Insufficient documentation

## 2014-01-25 DIAGNOSIS — Z862 Personal history of diseases of the blood and blood-forming organs and certain disorders involving the immune mechanism: Secondary | ICD-10-CM | POA: Diagnosis not present

## 2014-01-25 DIAGNOSIS — I5020 Unspecified systolic (congestive) heart failure: Secondary | ICD-10-CM | POA: Insufficient documentation

## 2014-01-25 DIAGNOSIS — C260 Malignant neoplasm of intestinal tract, part unspecified: Secondary | ICD-10-CM | POA: Diagnosis not present

## 2014-01-25 DIAGNOSIS — Z79899 Other long term (current) drug therapy: Secondary | ICD-10-CM | POA: Insufficient documentation

## 2014-01-25 DIAGNOSIS — I10 Essential (primary) hypertension: Secondary | ICD-10-CM | POA: Insufficient documentation

## 2014-01-25 DIAGNOSIS — M79672 Pain in left foot: Secondary | ICD-10-CM

## 2014-01-25 DIAGNOSIS — R0600 Dyspnea, unspecified: Secondary | ICD-10-CM

## 2014-01-25 DIAGNOSIS — E785 Hyperlipidemia, unspecified: Secondary | ICD-10-CM | POA: Diagnosis not present

## 2014-01-25 DIAGNOSIS — R0602 Shortness of breath: Secondary | ICD-10-CM | POA: Diagnosis not present

## 2014-01-25 DIAGNOSIS — C50919 Malignant neoplasm of unspecified site of unspecified female breast: Secondary | ICD-10-CM | POA: Diagnosis not present

## 2014-01-25 DIAGNOSIS — G589 Mononeuropathy, unspecified: Secondary | ICD-10-CM | POA: Diagnosis not present

## 2014-01-25 DIAGNOSIS — Z8701 Personal history of pneumonia (recurrent): Secondary | ICD-10-CM | POA: Insufficient documentation

## 2014-01-25 DIAGNOSIS — Z9889 Other specified postprocedural states: Secondary | ICD-10-CM | POA: Diagnosis not present

## 2014-01-25 DIAGNOSIS — E119 Type 2 diabetes mellitus without complications: Secondary | ICD-10-CM | POA: Insufficient documentation

## 2014-01-25 DIAGNOSIS — R109 Unspecified abdominal pain: Secondary | ICD-10-CM

## 2014-01-25 DIAGNOSIS — I2510 Atherosclerotic heart disease of native coronary artery without angina pectoris: Secondary | ICD-10-CM | POA: Insufficient documentation

## 2014-01-25 DIAGNOSIS — M129 Arthropathy, unspecified: Secondary | ICD-10-CM | POA: Diagnosis not present

## 2014-01-25 DIAGNOSIS — Z853 Personal history of malignant neoplasm of breast: Secondary | ICD-10-CM | POA: Diagnosis not present

## 2014-01-25 DIAGNOSIS — K219 Gastro-esophageal reflux disease without esophagitis: Secondary | ICD-10-CM | POA: Diagnosis not present

## 2014-01-25 DIAGNOSIS — J45901 Unspecified asthma with (acute) exacerbation: Secondary | ICD-10-CM | POA: Insufficient documentation

## 2014-01-25 LAB — COMPREHENSIVE METABOLIC PANEL
ALT: 8 U/L (ref 0–35)
AST: 15 U/L (ref 0–37)
Albumin: 3.4 g/dL — ABNORMAL LOW (ref 3.5–5.2)
Alkaline Phosphatase: 33 U/L — ABNORMAL LOW (ref 39–117)
BILIRUBIN TOTAL: 0.3 mg/dL (ref 0.3–1.2)
BUN: 15 mg/dL (ref 6–23)
CHLORIDE: 103 meq/L (ref 96–112)
CO2: 20 meq/L (ref 19–32)
CREATININE: 0.83 mg/dL (ref 0.50–1.10)
Calcium: 10.8 mg/dL — ABNORMAL HIGH (ref 8.4–10.5)
GFR calc Af Amer: 71 mL/min — ABNORMAL LOW (ref >90)
GFR, EST NON AFRICAN AMERICAN: 61 mL/min — AB (ref >90)
Glucose, Bld: 150 mg/dL — ABNORMAL HIGH (ref 70–99)
Potassium: 4 mEq/L (ref 3.7–5.3)
SODIUM: 139 meq/L (ref 137–147)
Total Protein: 7.1 g/dL (ref 6.0–8.3)

## 2014-01-25 LAB — CBC WITH DIFFERENTIAL/PLATELET
BASOS ABS: 0 10*3/uL (ref 0.0–0.1)
BASOS PCT: 0 % (ref 0–1)
Eosinophils Absolute: 0.1 10*3/uL (ref 0.0–0.7)
Eosinophils Relative: 2 % (ref 0–5)
HCT: 32.7 % — ABNORMAL LOW (ref 36.0–46.0)
Hemoglobin: 10.6 g/dL — ABNORMAL LOW (ref 12.0–15.0)
LYMPHS PCT: 33 % (ref 12–46)
Lymphs Abs: 1.6 10*3/uL (ref 0.7–4.0)
MCH: 23 pg — ABNORMAL LOW (ref 26.0–34.0)
MCHC: 32.4 g/dL (ref 30.0–36.0)
MCV: 70.9 fL — ABNORMAL LOW (ref 78.0–100.0)
Monocytes Absolute: 0.4 10*3/uL (ref 0.1–1.0)
Monocytes Relative: 8 % (ref 3–12)
NEUTROS ABS: 2.7 10*3/uL (ref 1.7–7.7)
NEUTROS PCT: 57 % (ref 43–77)
PLATELETS: 180 10*3/uL (ref 150–400)
RBC: 4.61 MIL/uL (ref 3.87–5.11)
RDW: 16.2 % — AB (ref 11.5–15.5)
WBC: 4.8 10*3/uL (ref 4.0–10.5)

## 2014-01-25 LAB — PRO B NATRIURETIC PEPTIDE: PRO B NATRI PEPTIDE: 256.8 pg/mL (ref 0–450)

## 2014-01-25 LAB — TROPONIN I: Troponin I: <0.3 ng/mL (ref <0.30)

## 2014-01-25 MED ORDER — GABAPENTIN 300 MG PO CAPS: 300.0000 mg | ORAL_CAPSULE | Freq: Two times a day (BID) | ORAL | Status: DC

## 2014-01-25 MED ORDER — HYDROCODONE-ACETAMINOPHEN 5-325 MG PO TABS
2.0000 | ORAL_TABLET | Freq: Once | ORAL | Status: AC
  Administered 2014-01-25: 2 via ORAL
  Filled 2014-01-25: qty 2

## 2014-01-25 MED ORDER — HYDROCODONE-ACETAMINOPHEN 5-325 MG PO TABS: 2.0000 | ORAL_TABLET | ORAL | Status: DC | PRN

## 2014-01-25 NOTE — ED Notes (Signed)
Pt alert x4 MAE randomly

## 2014-01-25 NOTE — Progress Notes (Signed)
2 unsuccessful attempts in rt arm- pt is adamant that left arm is better, however she has pink "restricted" armband- Notified her RN Levada Dy of same- she will remove armband- pt is a bilateral mastectomy

## 2014-01-25 NOTE — Discharge Instructions (Signed)
Neurontin as prescribed.  Hydrocodone as prescribed as needed for pain.  Discuss your living arrangements with your primary Dr. in followup sometime in the next one to 2 weeks. Return to the ER if your symptoms substantially worsen or change.   Abdominal Pain, Women Abdominal (stomach, pelvic, or belly) pain can be caused by many things. It is important to tell your doctor:  The location of the pain.  Does it come and go or is it present all the time?  Are there things that start the pain (eating certain foods, exercise)?  Are there other symptoms associated with the pain (fever, nausea, vomiting, diarrhea)? All of this is helpful to know when trying to find the cause of the pain. CAUSES   Stomach: virus or bacteria infection, or ulcer.  Intestine: appendicitis (inflamed appendix), regional ileitis (Crohn's disease), ulcerative colitis (inflamed colon), irritable bowel syndrome, diverticulitis (inflamed diverticulum of the colon), or cancer of the stomach or intestine.  Gallbladder disease or stones in the gallbladder.  Kidney disease, kidney stones, or infection.  Pancreas infection or cancer.  Fibromyalgia (pain disorder).  Diseases of the female organs:  Uterus: fibroid (non-cancerous) tumors or infection.  Fallopian tubes: infection or tubal pregnancy.  Ovary: cysts or tumors.  Pelvic adhesions (scar tissue).  Endometriosis (uterus lining tissue growing in the pelvis and on the pelvic organs).  Pelvic congestion syndrome (female organs filling up with blood just before the menstrual period).  Pain with the menstrual period.  Pain with ovulation (producing an egg).  Pain with an IUD (intrauterine device, birth control) in the uterus.  Cancer of the female organs.  Functional pain (pain not caused by a disease, may improve without treatment).  Psychological pain.  Depression. DIAGNOSIS  Your doctor will decide the seriousness of your pain by doing an  examination.  Blood tests.  X-rays.  Ultrasound.  CT scan (computed tomography, special type of X-ray).  MRI (magnetic resonance imaging).  Cultures, for infection.  Barium enema (dye inserted in the large intestine, to better view it with X-rays).  Colonoscopy (looking in intestine with a lighted tube).  Laparoscopy (minor surgery, looking in abdomen with a lighted tube).  Major abdominal exploratory surgery (looking in abdomen with a large incision). TREATMENT  The treatment will depend on the cause of the pain.   Many cases can be observed and treated at home.  Over-the-counter medicines recommended by your caregiver.  Prescription medicine.  Antibiotics, for infection.  Birth control pills, for painful periods or for ovulation pain.  Hormone treatment, for endometriosis.  Nerve blocking injections.  Physical therapy.  Antidepressants.  Counseling with a psychologist or psychiatrist.  Minor or major surgery. HOME CARE INSTRUCTIONS   Do not take laxatives, unless directed by your caregiver.  Take over-the-counter pain medicine only if ordered by your caregiver. Do not take aspirin because it can cause an upset stomach or bleeding.  Try a clear liquid diet (broth or water) as ordered by your caregiver. Slowly move to a bland diet, as tolerated, if the pain is related to the stomach or intestine.  Have a thermometer and take your temperature several times a day, and record it.  Bed rest and sleep, if it helps the pain.  Avoid sexual intercourse, if it causes pain.  Avoid stressful situations.  Keep your follow-up appointments and tests, as your caregiver orders.  If the pain does not go away with medicine or surgery, you may try:  Acupuncture.  Relaxation exercises (yoga, meditation).  Group  therapy.  Counseling. SEEK MEDICAL CARE IF:   You notice certain foods cause stomach pain.  Your home care treatment is not helping your pain.  You  need stronger pain medicine.  You want your IUD removed.  You feel faint or lightheaded.  You develop nausea and vomiting.  You develop a rash.  You are having side effects or an allergy to your medicine. SEEK IMMEDIATE MEDICAL CARE IF:   Your pain does not go away or gets worse.  You have a fever.  Your pain is felt only in portions of the abdomen. The right side could possibly be appendicitis. The left lower portion of the abdomen could be colitis or diverticulitis.  You are passing blood in your stools (bright red or black tarry stools, with or without vomiting).  You have blood in your urine.  You develop chills, with or without a fever.  You pass out. MAKE SURE YOU:   Understand these instructions.  Will watch your condition.  Will get help right away if you are not doing well or get worse. Document Released: 08/27/2007 Document Revised: 01/22/2012 Document Reviewed: 09/16/2009 [facility] Patient Information 2014 [place], [place].  Shortness of Breath Shortness of breath means you have trouble breathing. Shortness of breath needs medical care right away. HOME CARE   Do not smoke.  Avoid being around chemicals or things (paint fumes, dust) that may bother your breathing.  Rest as needed. Slowly begin your normal activities.  Only take medicines as told by your doctor.  Keep all doctor visits as told. GET HELP RIGHT AWAY IF:   Your shortness of breath gets worse.  You feel lightheaded, pass out (faint), or have a cough that is not helped by medicine.  You cough up blood.  You have pain with breathing.  You have pain in your chest, arms, shoulders, or belly (abdomen).  You have a fever.  You cannot walk up stairs or exercise the way you normally do.  You do not get better in the time expected.  You have a hard time doing normal activities even with rest.  You have problems with your medicines.  You have any new symptoms. MAKE SURE  YOU:  Understand these instructions.  Will watch your condition.  Will get help right away if you are not doing well or get worse. Document Released: 04/17/2008 Document Revised: 04/30/2012 Document Reviewed: 01/15/2012 [facility] Patient Information 2014 [place], [place].

## 2014-01-25 NOTE — ED Notes (Signed)
Pt from home via [facility] with c/o shortness of breath, 100% on RA, pt c/o of bilateral foot pain.  Pt in NAD, A&O.

## 2014-01-25 NOTE — ED Provider Notes (Signed)
CSN: [id number]     Arrival date & time 01/25/14  1026 History   First MD Initiated Contact with Patient 01/25/14 1034     Chief Complaint  Patient presents with  . Shortness of Breath     (Consider location/radiation/quality/duration/timing/severity/associated sxs/prior Treatment) HPI Comments: Patient is an 80 year old female with past medical history of diabetes, coronary artery disease, congestive heart failure, hypertension. She is brought to the ER by ambulance after an episode of difficulty breathing. She is somewhat of a difficult historian and the majority of the history was taken from the son who was present at bedside. She called one of her sons telling them that she was having difficulty breathing who in turn called 911. The patient was found there to be tachypneic and tripoding, however with oxygen saturations of 99%. She denies to me she is having any chest discomfort or productive cough. She denies any fevers or chills. She just tells me she is having difficulty catching her breath.  Patient is a 80 y.o. female presenting with shortness of breath. The history is provided by the patient.  Shortness of Breath Severity:  Moderate Onset quality:  Sudden Duration:  3 hours Timing:  Constant Progression:  Unchanged Chronicity:  New Context: not activity   Relieved by:  Nothing Worsened by:  Nothing tried   Past Medical History  Diagnosis Date  . Breast cancer 10/2010    Invasive Ductal Carcinoma, s/p bilateral mastectomy, now on Tamoxifen. Followed by Dr Jamse Arn.   . Diabetes mellitus   . Hypertension   . Hyperlipidemia   . Anemia     Iron def anemia  . CAD (coronary artery disease)   . CHF (congestive heart failure)     Systolic   . History of colon cancer   . Neuropathy   . Allergy   . GERD (gastroesophageal reflux disease)   . Hiatal hernia   . Diverticulosis   . Asthma   . Arthritis   . H/O: GI bleed   . Breast cancer 2011    s/p Bilateral masectomy  .  Shortness of breath   . Pneumonia 03/2012  . Carotid artery aneurysm 08/2010    right ICA, 5 x 72mm  . DDD (degenerative disc disease), cervical   . Colon cancer   . Colon cancer 12/11/2013   Past Surgical History  Procedure Laterality Date  . Breast masectomy  10/2010    Bilateral masectomy by Dr Lucia Gaskins s/p invasive ductal carcinoma.  . Cardiac  catherization  2007    Severe 3-vessel disease.  EF 20-25%.  . Esophagogastroduodenoscopy  2001    Esophageal Tear  . Total knee arthroplasty  2001  . Esophagogastroduodenoscopy  10/27/2012    Procedure: ESOPHAGOGASTRODUODENOSCOPY (EGD);  Surgeon: Irene Shipper, MD;  Location: [facility] ENDOSCOPY;  Service: Endoscopy;  Laterality: N/A;  pat  . Breast surgery     Family History  Problem Relation Age of Onset  . Sickle cell anemia Other   . Heart disease Mother   . Heart disease Father   . Cancer Daughter 62    breast ca  . Cancer Daughter 68    breast ca   History  Substance Use Topics  . Smoking status: Never Smoker   . Smokeless tobacco: Current User    Types: Snuff  . Alcohol Use: No   OB History   Grav Para Term Preterm Abortions TAB SAB Ect Mult Living  Review of Systems  Respiratory: Positive for shortness of breath.   All other systems reviewed and are negative.      Allergies  Peanuts and Lisinopril  Home Medications   Current Outpatient Rx  Name  Route  Sig  Dispense  Refill  . acetaminophen (TYLENOL) 325 MG tablet   Oral   Take 2 tablets (650 mg total) by mouth every 6 (six) hours as needed for moderate pain.   60 tablet   1   . albuterol (PROVENTIL HFA;VENTOLIN HFA) 108 (90 BASE) MCG/ACT inhaler   Inhalation   Inhale 2 puffs into the lungs every 6 (six) hours as needed. For shortness of breath   1 Inhaler   6   . amLODipine (NORVASC) 10 MG tablet   Oral   Take 1 tablet (10 mg total) by mouth daily.   30 tablet   3   . aspirin (ASPIRIN CHILDRENS) 81 MG chewable tablet   Oral   Chew 81  mg by mouth daily.           . carvedilol (COREG) 12.5 MG tablet   Oral   Take 12.5 mg by mouth 2 (two) times daily with a meal.         . furosemide (LASIX) 20 MG tablet   Oral   Take 1 tablet (20 mg total) by mouth daily.   30 tablet   3   . HYDROcodone-acetaminophen (NORCO/VICODIN) 5-325 MG per tablet   Oral   Take 1 tablet by mouth every 6 (six) hours as needed for moderate pain.         Marland Kitchen losartan (COZAAR) 50 MG tablet   Oral   Take 1 tablet (50 mg total) by mouth daily.   30 tablet   4   . magnesium hydroxide (MILK OF MAGNESIA) 400 MG/5ML suspension   Oral   Take 30 mLs by mouth daily as needed for mild constipation.         . metFORMIN (GLUCOPHAGE) 500 MG tablet   Oral   Take 1 tablet (500 mg total) by mouth 2 (two) times daily with a meal.   60 tablet   5   . mirtazapine (REMERON) 15 MG tablet   Oral   Take 15 mg by mouth at bedtime.         Marland Kitchen PATADAY 0.2 % SOLN   Both Eyes   Place 1 drop into both eyes daily as needed. For itchy eyes         . potassium chloride (K-DUR) 10 MEQ tablet   Oral   Take 1 tablet (10 mEq total) by mouth daily. Take with the lasix   30 tablet   0   . ranitidine (ZANTAC) 150 MG capsule   Oral   Take 150 mg by mouth 2 (two) times daily as needed for heartburn.         . rosuvastatin (CRESTOR) 20 MG tablet   Oral   Take 20 mg by mouth daily.         . tamoxifen (NOLVADEX) 20 MG tablet   Oral   Take 20 mg by mouth daily.          BP 153/56  Pulse 70  Temp(Src) 98 F (36.7 C) (Oral)  Resp 21  SpO2 100% Physical Exam  Nursing note and vitals reviewed. Constitutional: She is oriented to person, place, and time. She appears well-developed and well-nourished. No distress.  Patient is an elderly female in no acute  distress. She appears anxious and is hyperventilating.  HENT:  Head: Normocephalic and atraumatic.  Mouth/Throat: Oropharynx is clear and moist.  Neck: Normal range of motion. Neck supple.   Cardiovascular: Normal rate and regular rhythm.  Exam reveals no gallop and no friction rub.   No murmur heard. Pulmonary/Chest: Effort normal and breath sounds normal. No respiratory distress. She has no wheezes. She has no rales. She exhibits no tenderness.  Abdominal: Soft. Bowel sounds are normal. She exhibits no distension. There is no tenderness.  Musculoskeletal: Normal range of motion. She exhibits no edema.  Neurological: She is alert and oriented to person, place, and time.  Skin: Skin is warm and dry. She is not diaphoretic.    ED Course  Procedures (including critical care time) Labs Review Labs Reviewed - No data to display Imaging Review No results found.   EKG Interpretation   Date/Time:  Sunday January 25 2014 10:39:20 EDT Ventricular Rate:  69 PR Interval:  179 QRS Duration: 68 QT Interval:  438 QTC Calculation: 469 R Axis:   21 Text Interpretation:  Sinus rhythm Probable left atrial enlargement  Confirmed by [facility]  MD, Jamarrion Budai ([id number]) on 01/25/2014 10:45:18 AM      MDM   Final diagnoses:  None    Patient is a an 80 year old female who presents initially with shortness of breath. She was in no respiratory distress and her oxygen saturations were 100% on room air on arrival. Chest x-ray and workup was essentially unremarkable. Her EKG, troponin, and BNP were all within normal limits. When I discussed these results with her she began to complain of epigastric discomfort. There is mild tenderness in the epigastrium, therefore a CT scan was obtained as well this was negative. When I returned to discuss these results with the patient, she then began to complain of pain in her feet. She has a history of diabetes and I suspect this may be a peripheral neuropathy. I will prescribe Neurontin and pain medication.  The patient on several occasions stated that she wanted to go to a nursing home. I've advised the family to discuss this with her primary Dr. in followup. This  does not appear to be an emergent situation and I see no medical reason for admission to the hospital.  Veryl Speak, MD 01/25/14 1625

## 2014-01-28 ENCOUNTER — Ambulatory Visit (INDEPENDENT_AMBULATORY_CARE_PROVIDER_SITE_OTHER): Payer: Medicare Other | Admitting: Family Medicine

## 2014-01-28 ENCOUNTER — Encounter: Payer: Self-pay | Admitting: Family Medicine

## 2014-01-28 VITALS — BP 149/76 | HR 77 | Temp 97.5°F | Wt 119.0 lb

## 2014-01-28 DIAGNOSIS — Z9181 History of falling: Secondary | ICD-10-CM | POA: Diagnosis not present

## 2014-01-28 DIAGNOSIS — M79605 Pain in left leg: Secondary | ICD-10-CM

## 2014-01-28 DIAGNOSIS — E119 Type 2 diabetes mellitus without complications: Secondary | ICD-10-CM

## 2014-01-28 DIAGNOSIS — R269 Unspecified abnormalities of gait and mobility: Secondary | ICD-10-CM | POA: Diagnosis not present

## 2014-01-28 DIAGNOSIS — R5383 Other fatigue: Secondary | ICD-10-CM

## 2014-01-28 DIAGNOSIS — M25559 Pain in unspecified hip: Secondary | ICD-10-CM | POA: Diagnosis not present

## 2014-01-28 DIAGNOSIS — R5381 Other malaise: Secondary | ICD-10-CM | POA: Diagnosis not present

## 2014-01-28 DIAGNOSIS — M79609 Pain in unspecified limb: Secondary | ICD-10-CM | POA: Diagnosis not present

## 2014-01-28 DIAGNOSIS — I359 Nonrheumatic aortic valve disorder, unspecified: Secondary | ICD-10-CM | POA: Diagnosis not present

## 2014-01-28 DIAGNOSIS — R531 Weakness: Secondary | ICD-10-CM

## 2014-01-28 DIAGNOSIS — M79604 Pain in right leg: Secondary | ICD-10-CM

## 2014-01-28 DIAGNOSIS — R2681 Unsteadiness on feet: Secondary | ICD-10-CM

## 2014-01-28 LAB — POCT GLYCOSYLATED HEMOGLOBIN (HGB A1C): Hemoglobin A1C: 7.7

## 2014-01-28 NOTE — Patient Instructions (Signed)
Please make an appointment with the pharmacy clinic for ABI's.   We will get you set up for physical therapy to help with your strength.   Continue taking the gabapentin twice a day.   I will call you with the results of the A1C.

## 2014-01-30 ENCOUNTER — Other Ambulatory Visit: Payer: Self-pay | Admitting: Family Medicine

## 2014-01-30 NOTE — Progress Notes (Signed)
Patient ID: [name]    DOB: 1924-11-09, 80 y.o.   MRN: [id number] --- Subjective:  [name] is a 80 y.o.female who presents for follow up on recent ED visit. She was seen in the ED with multiple complaints including shortness of breath, leg pain, epigastric pain and the desire to go to a nursing home.  Since then, she has not had anymore shortness of breath or abdominal pain, but she does continue to have foot pain.  She is in the office with her daughter who states that she has been complaining of burning pain in her feet. [name] reports that the morning of the office visit, she woke up with pain in the back of her right heel. She took a vicodin which helped. She was prescribed neurontin 300mg  bid which she has been taking since Sunday. She is tolerating the medicine without significant drowsiness.  She also feels like she is weak in her legs. She has to use a walker and cane no longer use her cane to sturdy herself.     ROS: see HPI Past Medical History: reviewed and updated medications and allergies. Social History: Tobacco: chews tobacco  Objective: Filed Vitals:   01/28/14 1114  BP: 149/76  Pulse: 77  Temp: 97.5 F (36.4 C)    Physical Examination:   General appearance - alert, well appearing, and in no distress Foot exam - some stiffness with ankle movement bilaterally, but no pain with motion. No tenderness to palpation of achilles tendon. No bony tenderness along foot.  Microfilament testing significant for decreased sensation along top of right toes and base of left heel.  Dorsalis pedis pulse difficult to palpate.  1+ pedal pitting edema on right compared to 2+ on left Knee - no tenderness to palpation, no pain with movement

## 2014-01-30 NOTE — Assessment & Plan Note (Signed)
Recurrent complaint. Now in right foot. Possibly osteoarthritis.  - continue gabapentin 300mg  bid since patient is tolerating it. Hesitate to increase dose any further in context of patient feeling unsteady and weak.  - instructed patient to make appointment for ABI's since I cannot reliably feel pulses. This would be both to see if PAD could potentially be contributing, but also if no evidence of PAD, I could prescribe compression stockings for her lower extremity swelling.  - PT for strength.

## 2014-01-30 NOTE — Assessment & Plan Note (Signed)
With patient's report of feeling unstable in her knees, would like for her to be evaluated by physical therapy. Patient did not voice wanting to go to Nursing home to me today but did to the ED physician recently. This is something that will need to be addressed further.  - PT eval and treat

## 2014-02-05 ENCOUNTER — Encounter: Payer: Self-pay | Admitting: Pharmacist

## 2014-02-05 ENCOUNTER — Ambulatory Visit (INDEPENDENT_AMBULATORY_CARE_PROVIDER_SITE_OTHER): Payer: Medicare Other | Admitting: Pharmacist

## 2014-02-05 VITALS — Wt 121.4 lb

## 2014-02-05 DIAGNOSIS — F17200 Nicotine dependence, unspecified, uncomplicated: Secondary | ICD-10-CM | POA: Diagnosis not present

## 2014-02-05 DIAGNOSIS — M79609 Pain in unspecified limb: Secondary | ICD-10-CM

## 2014-02-05 DIAGNOSIS — Z720 Tobacco use: Secondary | ICD-10-CM

## 2014-02-05 DIAGNOSIS — I359 Nonrheumatic aortic valve disorder, unspecified: Secondary | ICD-10-CM | POA: Diagnosis not present

## 2014-02-05 DIAGNOSIS — E119 Type 2 diabetes mellitus without complications: Secondary | ICD-10-CM | POA: Diagnosis not present

## 2014-02-05 DIAGNOSIS — R5383 Other fatigue: Secondary | ICD-10-CM | POA: Diagnosis not present

## 2014-02-05 DIAGNOSIS — R5381 Other malaise: Secondary | ICD-10-CM | POA: Diagnosis not present

## 2014-02-05 DIAGNOSIS — Z9181 History of falling: Secondary | ICD-10-CM | POA: Diagnosis not present

## 2014-02-05 DIAGNOSIS — R269 Unspecified abnormalities of gait and mobility: Secondary | ICD-10-CM | POA: Diagnosis not present

## 2014-02-05 DIAGNOSIS — M79604 Pain in right leg: Secondary | ICD-10-CM

## 2014-02-05 DIAGNOSIS — M25559 Pain in unspecified hip: Secondary | ICD-10-CM | POA: Diagnosis not present

## 2014-02-05 DIAGNOSIS — M79605 Pain in left leg: Principal | ICD-10-CM

## 2014-02-05 NOTE — Assessment & Plan Note (Signed)
Moderate PAD based on ABI of 0.58 in a patient with symptoms of numbness and throbbing at rest which is worse in her right leg. Discussed patient's long term use of snuff and she was agreeable to cutting down or quitting.. Continued current medication regimen and will refer to vascular surgery for further work up. Results reviewed and written information provided.  F/U Clinic Visit with Cardiology/Vascular evaluation set up by Dr. Losq within the month. Total time in face-to-face counseling 45 minutes.  Patient seen with Lauren Hickman, PharmD Candidate, Erika Von Vajna, PharmD Resident, and Kelley Miller, PhamD Resident. 

## 2014-02-05 NOTE — Progress Notes (Signed)
Patient ID: [name], female   DOB: Jun 25, 1924, 80 y.o.   MRN: [id number] Reviewed: Agree with Dr. Graylin Shiver documentation and management.

## 2014-02-05 NOTE — Patient Instructions (Addendum)
Thank you for coming to see [place] today!  Your blood flow studies in your legs show decreased blood flow. We will be setting up a referral for further work up.   Try to cut down on your chewing tobacco use to help to improve your blood flow.   Dr. Otis Dials will be making a referral for more evaluation of your leg pain within the next month.

## 2014-02-05 NOTE — Progress Notes (Signed)
S:    Patient arrives in her red sweater and hat with her walker. She presents to the clinic for PAD/ABI evaluation. Patient denies pain with walking.  Pain is described as numbness and throbbing  which occurs at rest, but states she has not had any pain since Sunday.  Reports pain starting while at rest or standing still. Denies pain worsens when walking up hill or in a hurry. Denies pain when walking at an ordinary pace on a level surface. Pain is localized to the lower portion of both legs but states that it is worse on the right. She states that her right leg used to "jump" prior to her hospitalization. Patient has used "[employer]" brand snuff TID since she was 80 years old.   O:  Lower extremity Physical Exam includes lower leg edema and diminished pulses on right foot especially on right ankle posterior tibial.  ABI overall = 0.58. Right Arm 146 mmHg    Left Arm 144 mmHg Right ankle posterior tibial 82 mmHg     dorsalis pedis 84 mmHg Left ankle posterior tibial 108 mmHg    dorsalis pedis 118 mmHg   A/P: Moderate PAD based on ABI of 0.58 in a patient with symptoms of numbness and throbbing at rest which is worse in her right leg. Discussed patient's long term use of snuff and she was agreeable to cutting down or quitting.. Continued current medication regimen and will refer to vascular surgery for further work up. Results reviewed and written information provided.  F/U Clinic Visit with Cardiology/Vascular evaluation set up by Dr. Otis Dials within the month. Total time in face-to-face counseling 45 minutes.  Patient seen with Epimenio Sarin, PharmD Candidate, Terrilyn Saver, PharmD Resident, and Vivia Ewing, PhamD Resident.

## 2014-02-05 NOTE — Assessment & Plan Note (Signed)
Moderate PAD based on ABI of 0.58 in a patient with symptoms of numbness and throbbing at rest which is worse in her right leg. Discussed patient's long term use of snuff and she was agreeable to cutting down or quitting.. Continued current medication regimen and will refer to vascular surgery for further work up. Results reviewed and written information provided.  F/U Clinic Visit with Cardiology/Vascular evaluation set up by Dr. Otis Dials within the month. Total time in face-to-face counseling 45 minutes.  Patient seen with Epimenio Sarin, PharmD Candidate, Terrilyn Saver, PharmD Resident, and Vivia Ewing, PhamD Resident.

## 2014-02-06 ENCOUNTER — Other Ambulatory Visit: Payer: Self-pay | Admitting: Family Medicine

## 2014-02-06 DIAGNOSIS — I739 Peripheral vascular disease, unspecified: Secondary | ICD-10-CM

## 2014-02-06 NOTE — Progress Notes (Signed)
Given results of ABI's showing moderate PAD in right leg with ABI of 0.58. WIll refer her to vascular surgery for further evaluation.   Liam Graham, PGY-3 Family Medicine Resident

## 2014-02-09 IMAGING — CR DG ANKLE COMPLETE 3+V*L*
3 series · 3 of 3 positions shown · non-contrast
Comparison: 07/22/2010

CLINICAL DATA: Pain and swelling left ankle, no known injury

EXAM:
LEFT ANKLE COMPLETE - 3+ VIEW

[x ankle ap left]
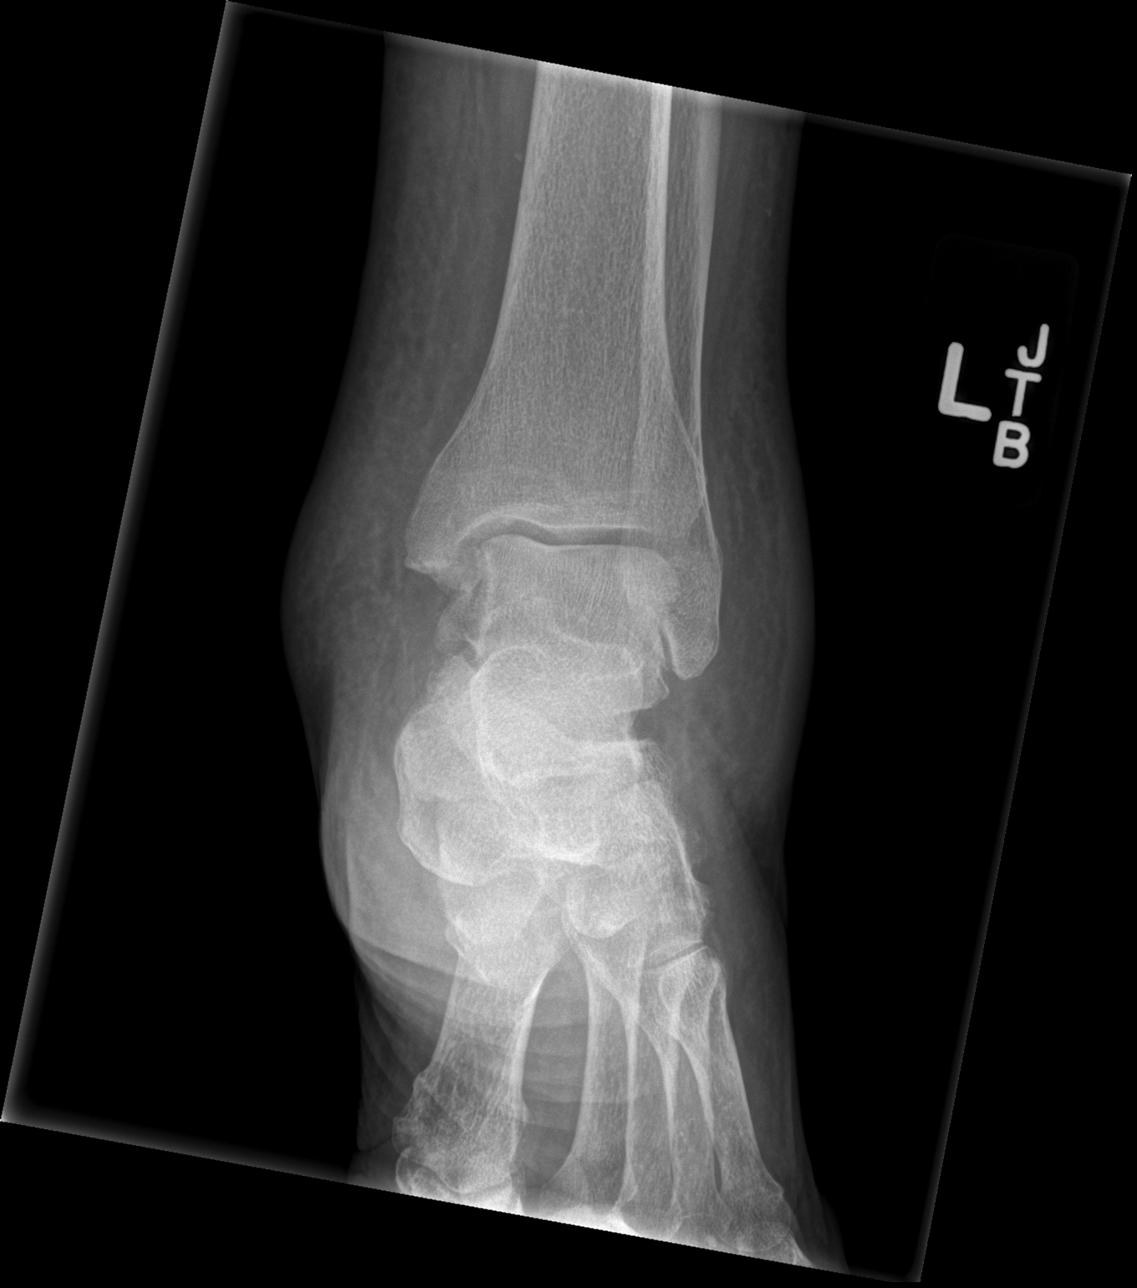

[x ankle obl left]
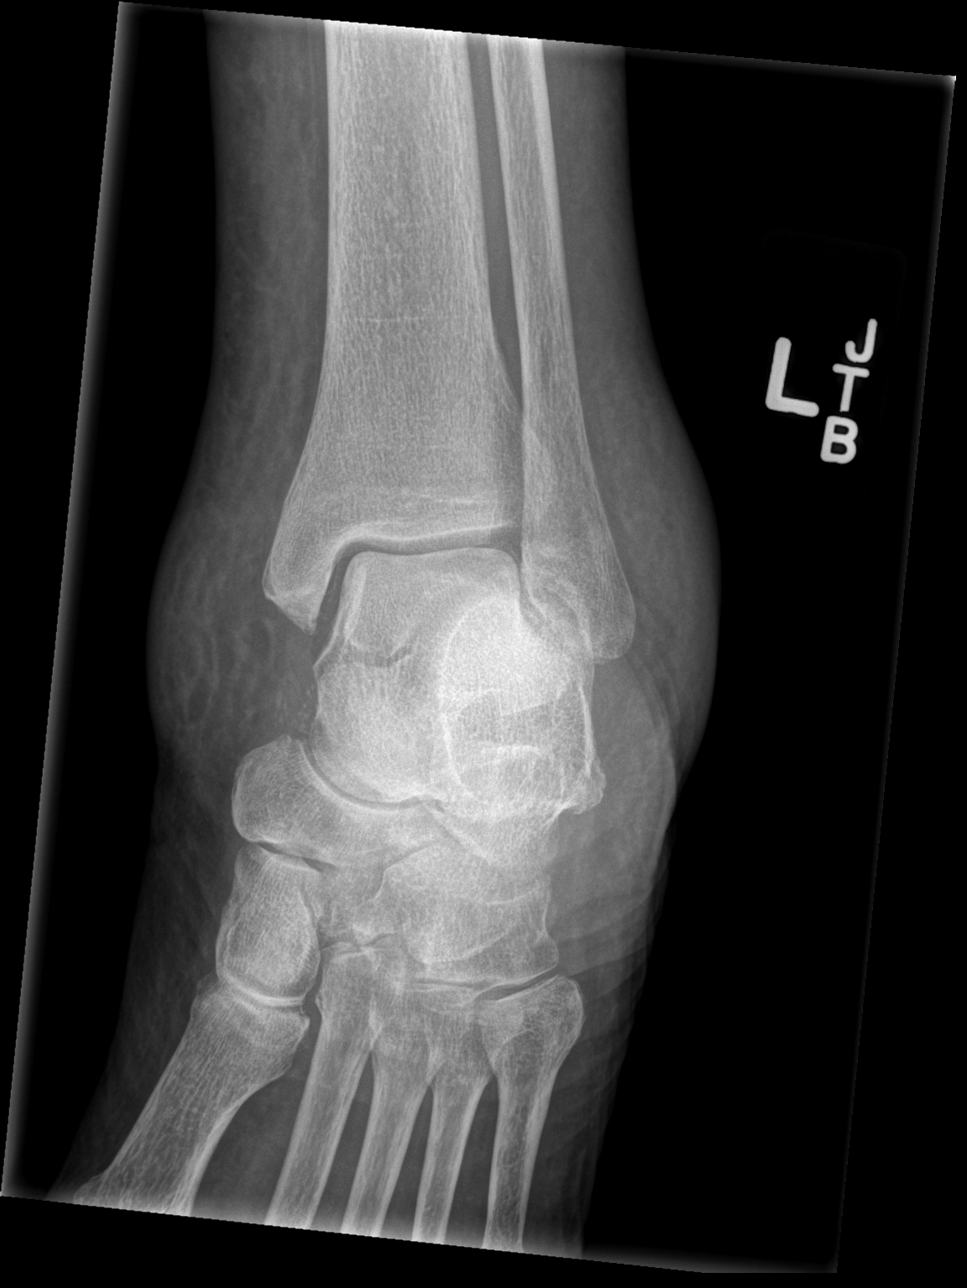

[x ankle lat left]
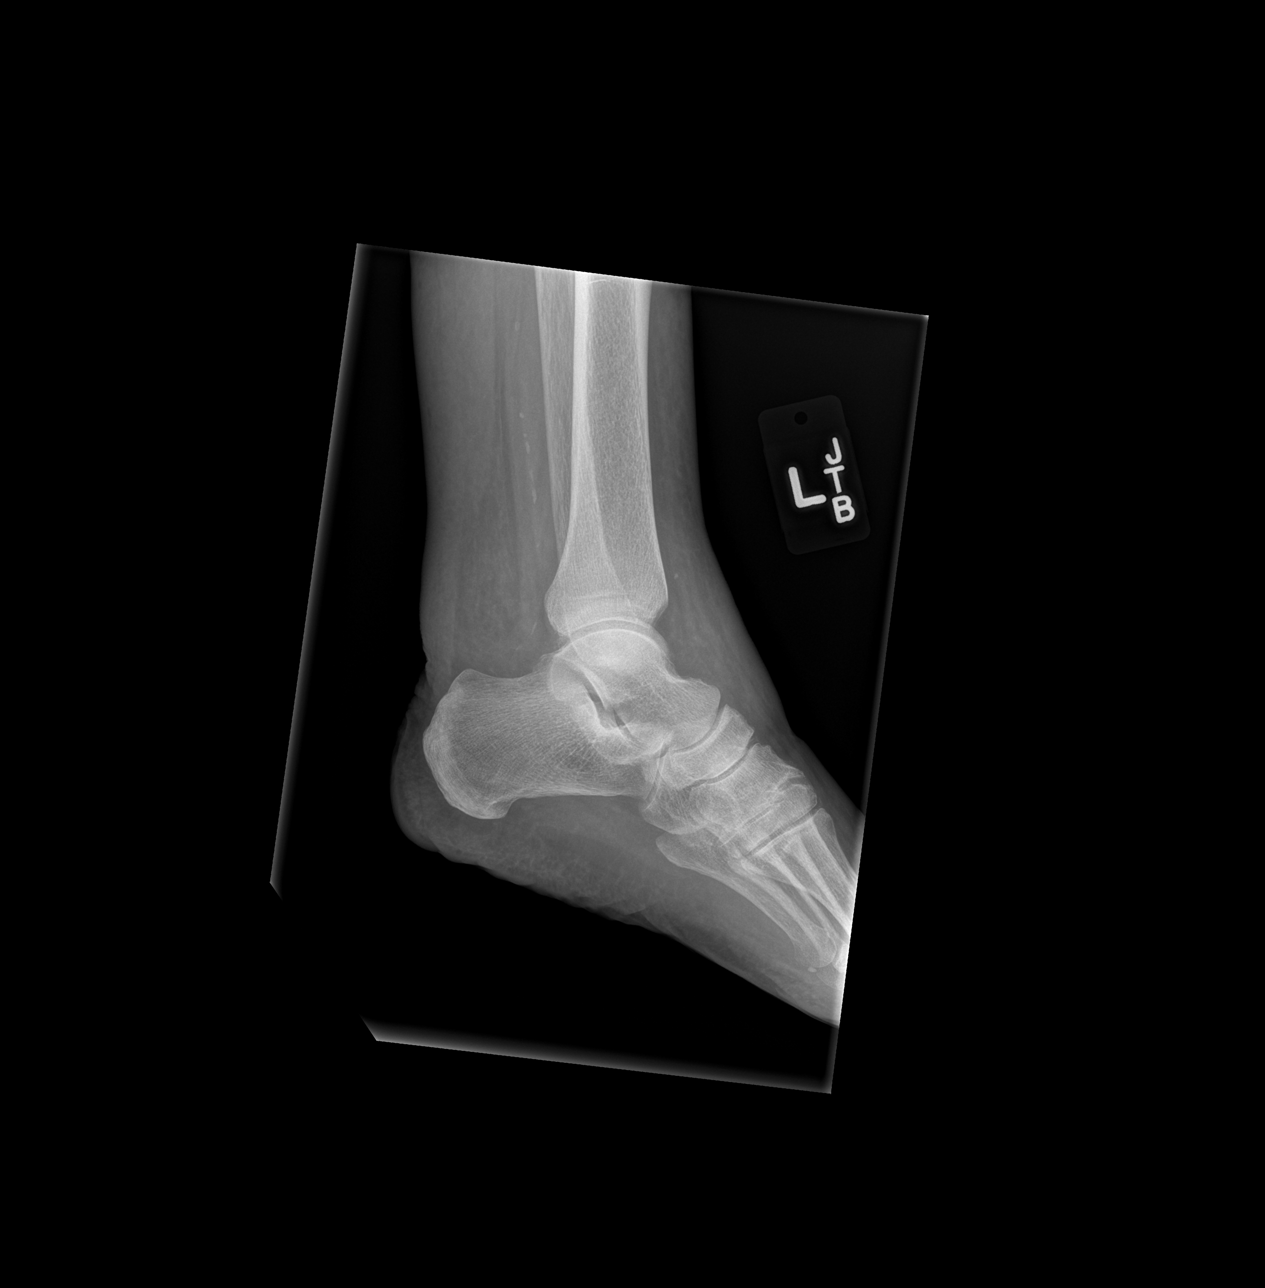

[3 of 3 positions shown; findings below may reference images not displayed]

FINDINGS: Significant soft tissue swelling.

Diffuse osseous demineralization.

Joint spaces preserved.

No acute fracture, dislocation or bone destruction.

Scattered vascular calcifications.
IMPRESSION: Significant soft tissue swelling and diffuse osseous
demineralization.

No acute osseous abnormalities.

## 2014-02-11 ENCOUNTER — Telehealth: Payer: Self-pay | Admitting: Family Medicine

## 2014-02-11 ENCOUNTER — Other Ambulatory Visit: Payer: Self-pay | Admitting: Family Medicine

## 2014-02-11 MED ORDER — HYDROCODONE-ACETAMINOPHEN 5-325 MG PO TABS: 1.0000 | ORAL_TABLET | Freq: Every day | ORAL | Status: DC | PRN

## 2014-02-11 NOTE — Telephone Encounter (Signed)
Pt called and would like the nurse to call her concerning some pain that she is having under her breast. [name]

## 2014-02-11 NOTE — Telephone Encounter (Signed)
Pt stated she is having left breast pain x 2 days.  Pain is at a level 10, stabbing pain.  Pt stated she took last pain medication Norco yesterday and started taking regular tylenol with no relief.  Pt also stated she was taking pain medication every 8 hours for pain in her feet.  Consulted with pt's PCP Dr. Otis Dials; will refill Norco #30 and no other refills until May 2015.  If pain persist pt will need to schedule appt for evaluation.  Rx will be left up front for pt pick up.  Pt stated understanding.  Derl Barrow, RN

## 2014-02-18 ENCOUNTER — Telehealth: Payer: Self-pay | Admitting: Family Medicine

## 2014-02-18 NOTE — Telephone Encounter (Signed)
Called pt.she reports, that she has been seen in a foot clinic and the podiatrist and also Dr.Losq had told her, that she needs to go to a different foot clinic. She is just checking on it. I told the pt that I would ask Dr.Losq. She agreed. [name]

## 2014-02-18 NOTE — Telephone Encounter (Signed)
Pt called and would like to have Dr. Otis Dials to call her. jw

## 2014-02-19 ENCOUNTER — Other Ambulatory Visit: Payer: Self-pay | Admitting: Surgery

## 2014-02-19 DIAGNOSIS — I739 Peripheral vascular disease, unspecified: Secondary | ICD-10-CM

## 2014-02-20 NOTE — Telephone Encounter (Signed)
Called patient back. She was inquiring about her vascular appointment. Looked in her referrals and saw that she has an appointment on 03/30/14 starting at 11:30am. I was not sure where she should be going for that, but told her of the appointment date and time and asked her to call us if she hadn't heard back about where to go by the beginning of May. She expressed understanding and agreed with plan.   Liam Graham, PGY-3 Family Medicine Resident

## 2014-02-23 ENCOUNTER — Ambulatory Visit (INDEPENDENT_AMBULATORY_CARE_PROVIDER_SITE_OTHER): Payer: Medicare Other | Admitting: Podiatry

## 2014-02-23 ENCOUNTER — Encounter: Payer: Self-pay | Admitting: Podiatry

## 2014-02-23 VITALS — BP 191/86 | HR 77 | Resp 16 | Ht 59.0 in | Wt 123.0 lb

## 2014-02-23 DIAGNOSIS — B351 Tinea unguium: Secondary | ICD-10-CM

## 2014-02-23 DIAGNOSIS — M79609 Pain in unspecified limb: Secondary | ICD-10-CM

## 2014-02-23 NOTE — Progress Notes (Signed)
Patient ID: [name], female   DOB: [birth year], 80 y.o.   MRN: [id number] Subjective: This alert orientated x50 80 year old black female known diabetic presents complaining of painful toenails. She is a patient in our practice since 2005.   Objective: Hypertrophic, elongated, brown, brittle toenails with palpable tenderness in all nail plates x10.  Assessment: Symptomatic onychomycoses x10   Plan: All nails are debrided back without any bleeding and reappoint at three-month intervals.

## 2014-02-27 ENCOUNTER — Telehealth: Payer: Self-pay | Admitting: Family Medicine

## 2014-02-27 MED ORDER — TAMOXIFEN CITRATE 20 MG PO TABS: 20.0000 mg | ORAL_TABLET | Freq: Every day | ORAL | Status: DC

## 2014-02-27 NOTE — Telephone Encounter (Signed)
Tamoxifen 20mg  daily sent to [facility] on [address].  Please let patient know.  Also, please let her know that if her symptoms get worst, she should be seen.   Thank you!  Liam Graham, PGY-3 Family Medicine Resident

## 2014-02-27 NOTE — Telephone Encounter (Signed)
Called pt. Pt's son will give her the message below. [name]

## 2014-02-27 NOTE — Telephone Encounter (Signed)
Called pt. She reports, that she has been feeling sore all over body since this morning. She denies fever, chills. Reports some swelling of legs. I offered her an appt today, but she declined. She said, that she does not want to be seen. She would like for Dr.Losq to send her Tamoxifen to her pharmacy. She has tried all week to get it refilled. She took her last pill today.  I told the pt that I would send her request to Dr.Losq. Mauricia Area

## 2014-02-27 NOTE — Telephone Encounter (Signed)
Pt called and would like someone from the red team to call her. jw

## 2014-02-27 NOTE — Telephone Encounter (Signed)
Called pt.

## 2014-03-03 ENCOUNTER — Other Ambulatory Visit: Payer: Self-pay | Admitting: *Deleted

## 2014-03-03 MED ORDER — TAMOXIFEN CITRATE 20 MG PO TABS: 20.0000 mg | ORAL_TABLET | Freq: Every day | ORAL | Status: DC

## 2014-03-14 IMAGING — CR DG CHEST 2V
1 series · 1 of 1 positions shown · non-contrast
Comparison: none

[w chest lat]
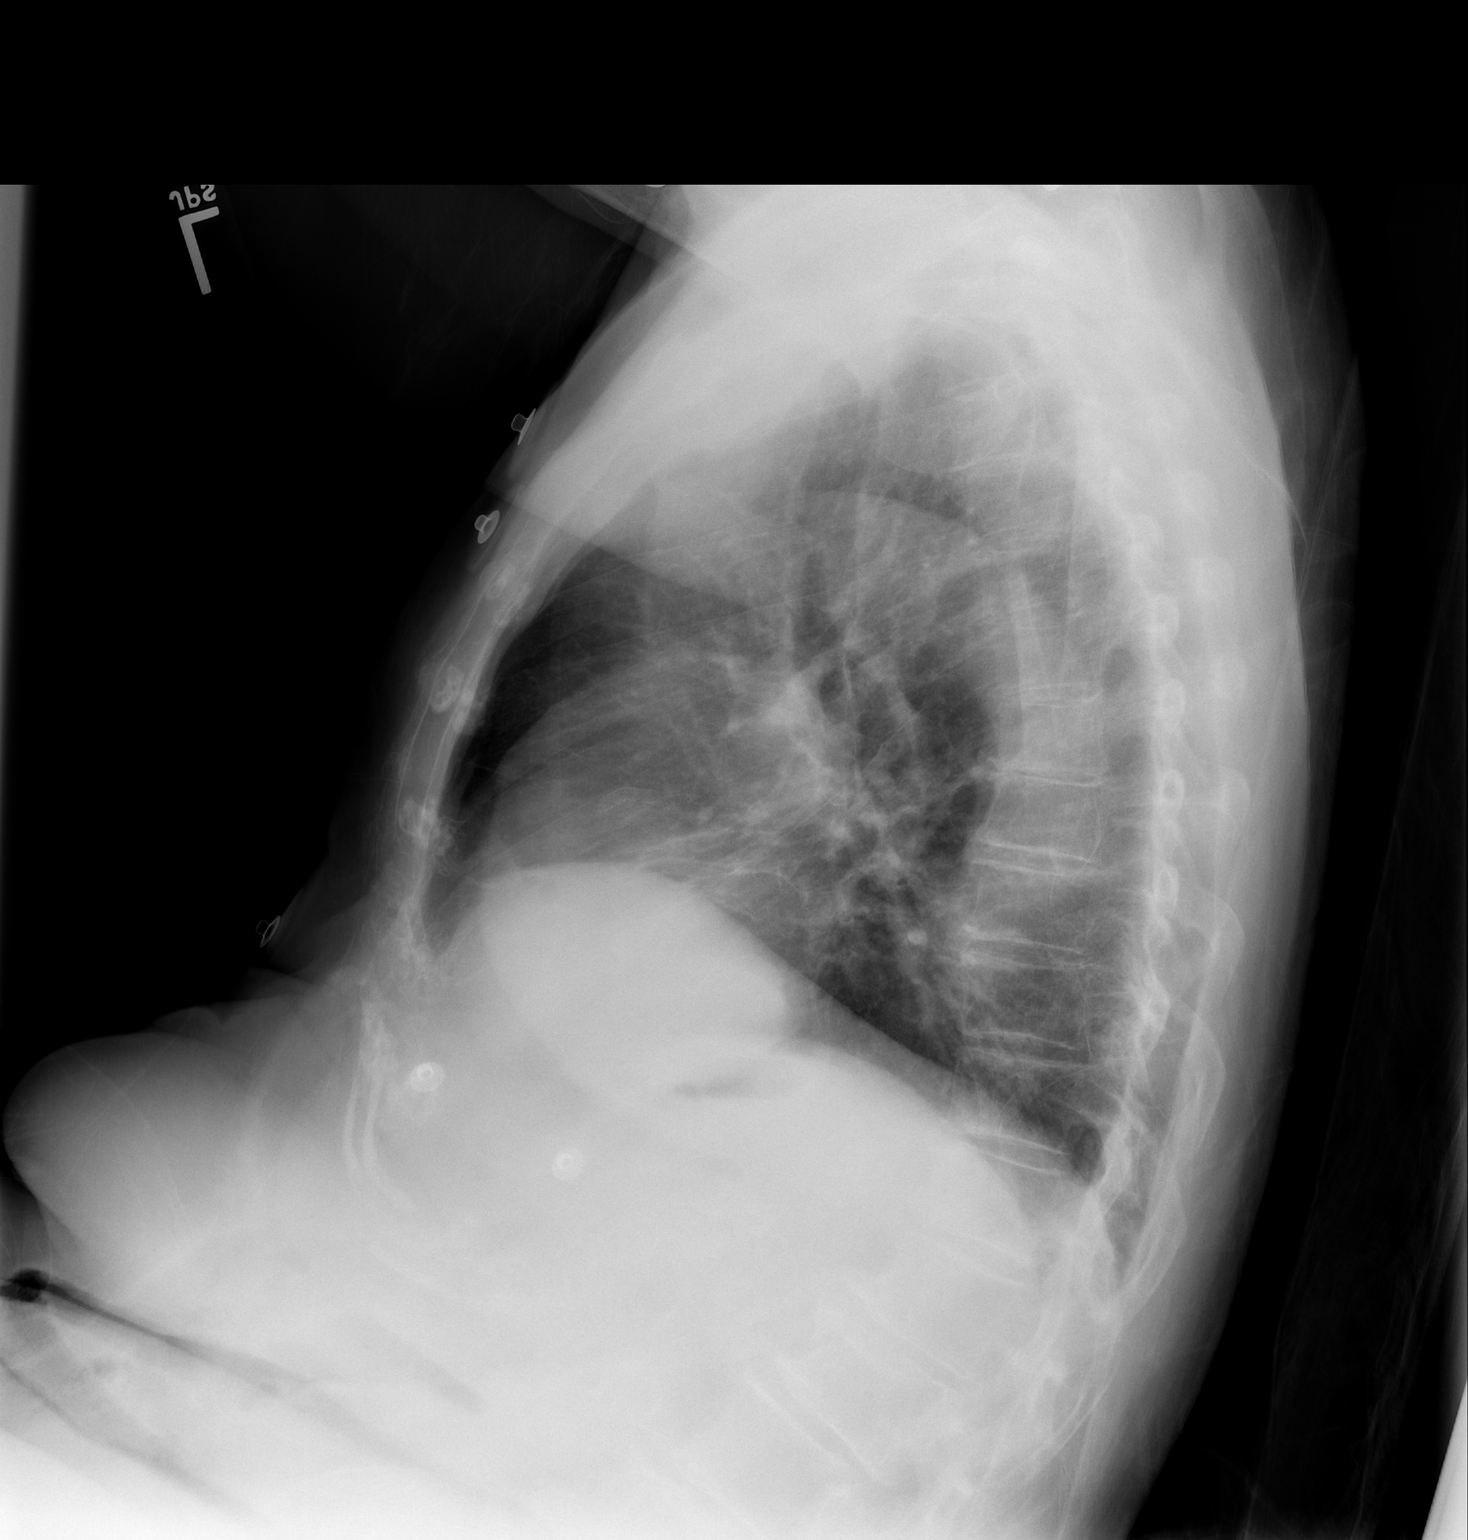

[1 of 1 positions shown; findings below may reference images not displayed]

CLINICAL DATA
Cough and short of breath

EXAM
CHEST  2 VIEW

COMPARISON
09/19/2013

FINDINGS
Mild right middle lobe scarring is unchanged. Negative for
pneumonia. Negative for heart failure or effusion. Lungs are
otherwise clear.

IMPRESSION
No active cardiopulmonary disease.

SIGNATURE

## 2014-03-18 IMAGING — CT CT ABD-PELV W/O CM
2 of 4 series · 16 of 46 positions shown, 18 images · non-contrast
Comparison: CT ABD/PELVIS W CM dated 11/24/2010

CLINICAL DATA: Upper abdominal/epigastric pain. Rib pain. Lower
extremity swelling. Breast cancer. Colon cancer. Diabetes. Patient
unable to take oral contrast and IV could not be obtained.

EXAM:
CT ABDOMEN AND PELVIS WITHOUT CONTRAST
TECHNIQUE: Multidetector CT imaging of the abdomen and pelvis was performed
following the standard protocol without intravenous contrast.

[Series 2: abd/ pelvis 5.0 i30f 1 · axial · 0.67mm/px · z∈[-1276,-941]mm · 13 of 75 slices shown, 15 images]
[im 4/75  soft-tissue]
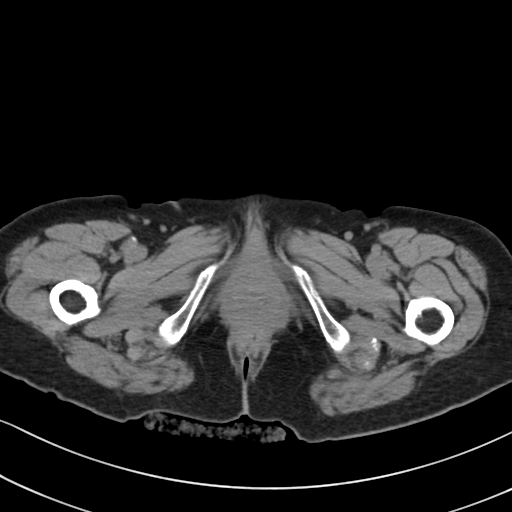
[im 4/75  bone]
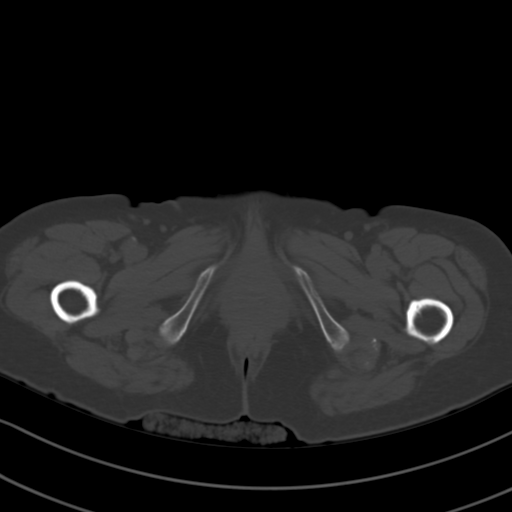
[im 10/75  soft-tissue]
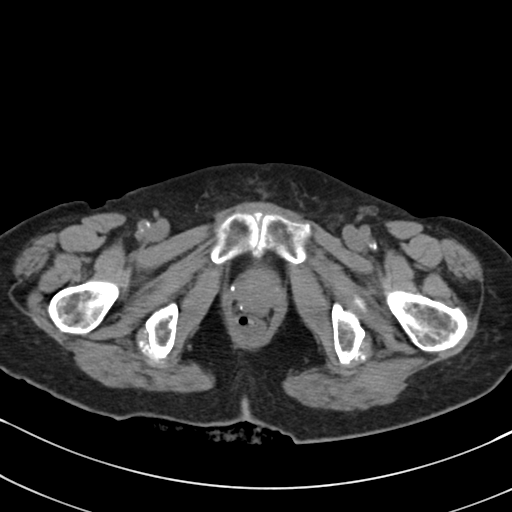
[im 17/75  soft-tissue]
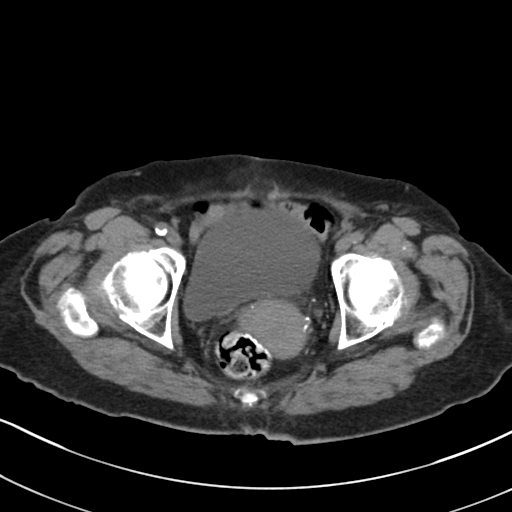
[im 20/75  soft-tissue]
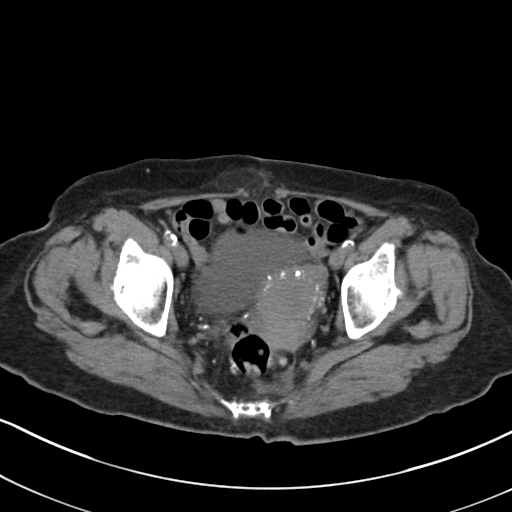
[im 26/75  soft-tissue]
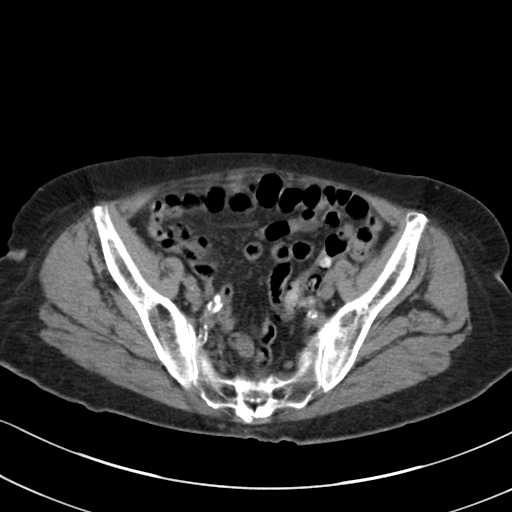
[im 33/75  soft-tissue]
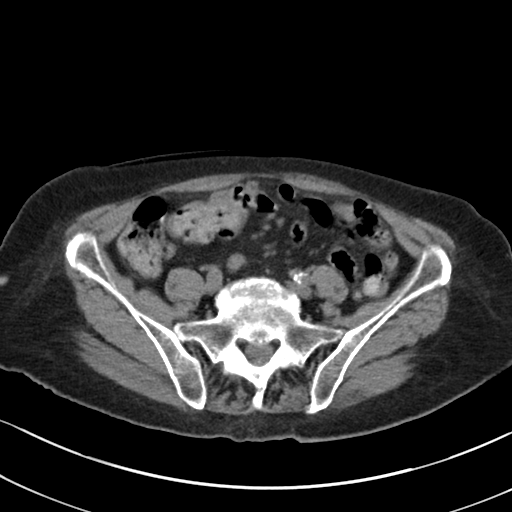
[im 39/75  soft-tissue]
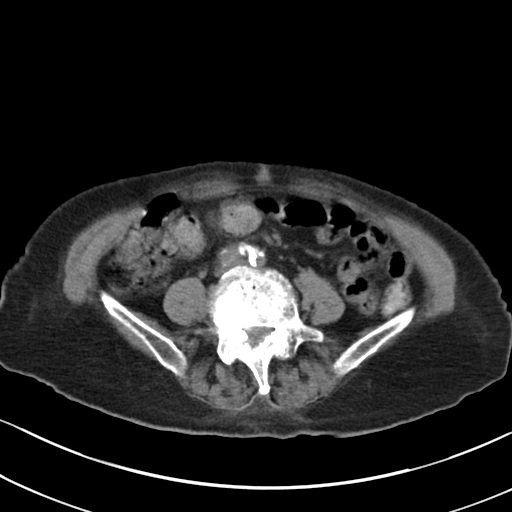
[im 42/75  soft-tissue]
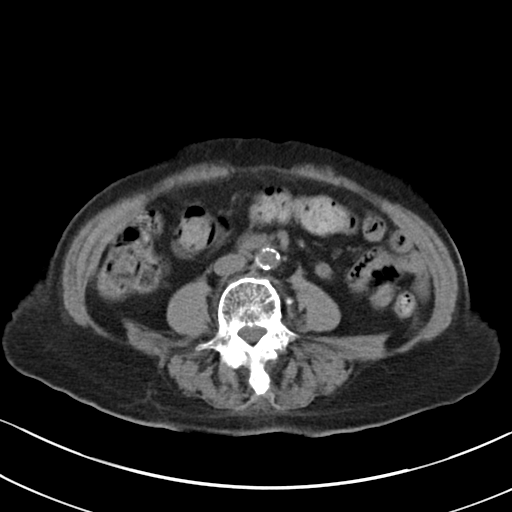
[im 49/75  soft-tissue]
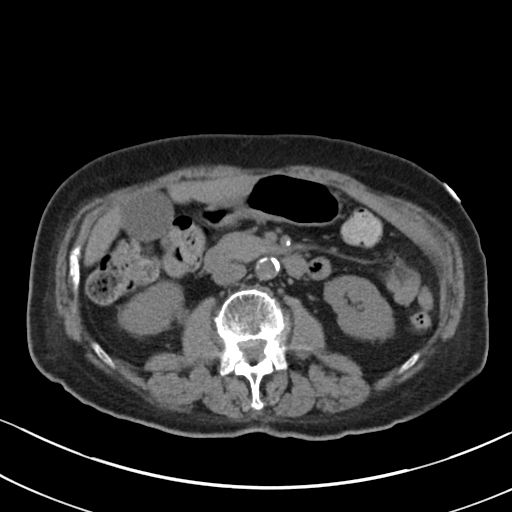
[im 49/75  bone]
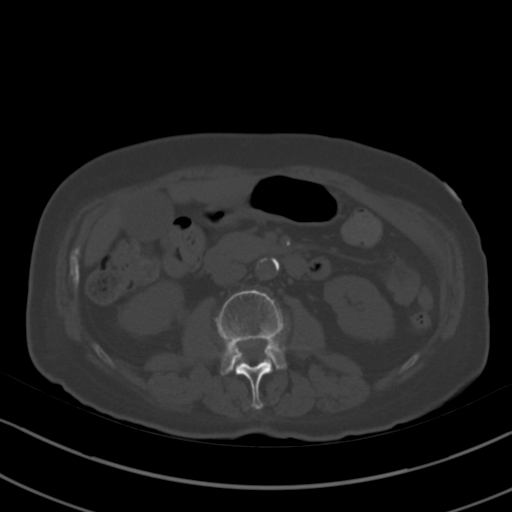
[im 55/75  soft-tissue]
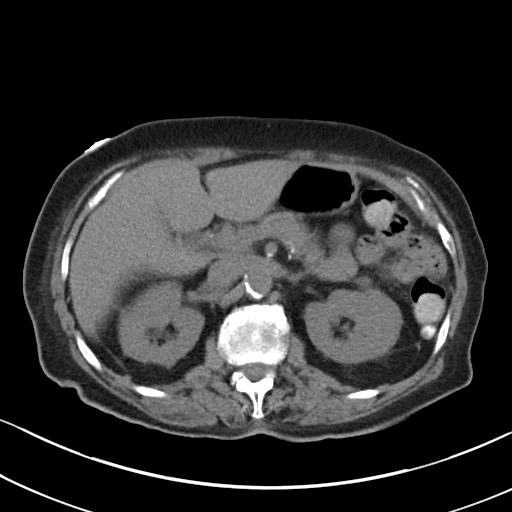
[im 58/75  soft-tissue]
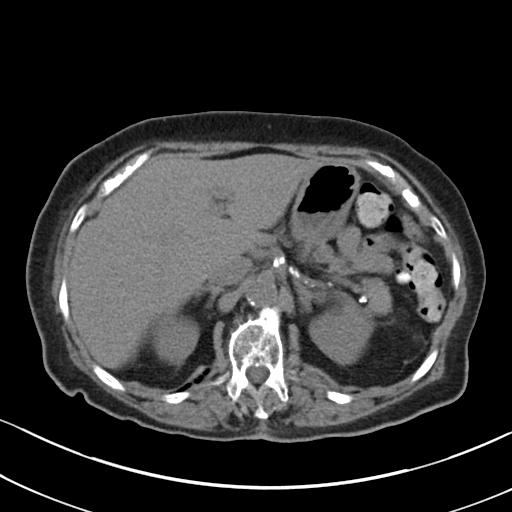
[im 65/75  soft-tissue]
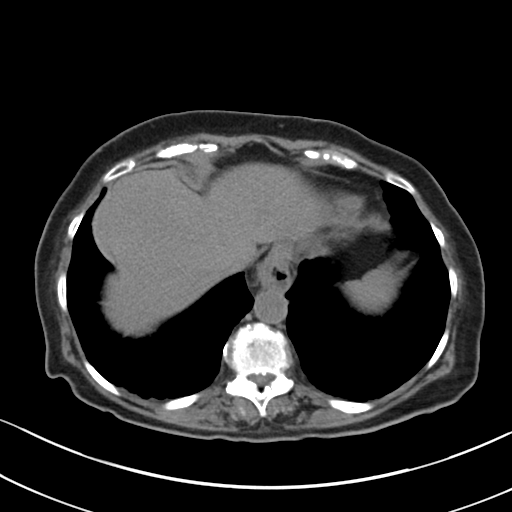
[im 71/75  soft-tissue]
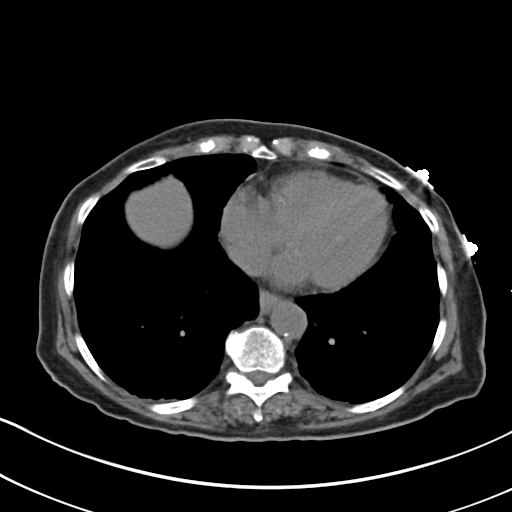

[Series 5: cor · coronal · 0.79mm/px · 3 of 101 slices shown]
[im 34/101  soft-tissue]
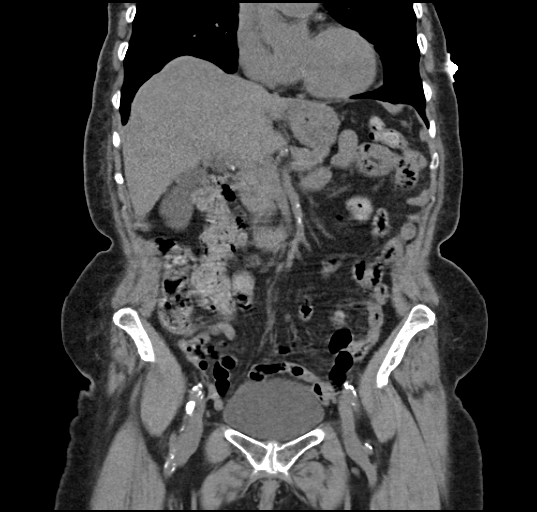
[im 45/101  soft-tissue]
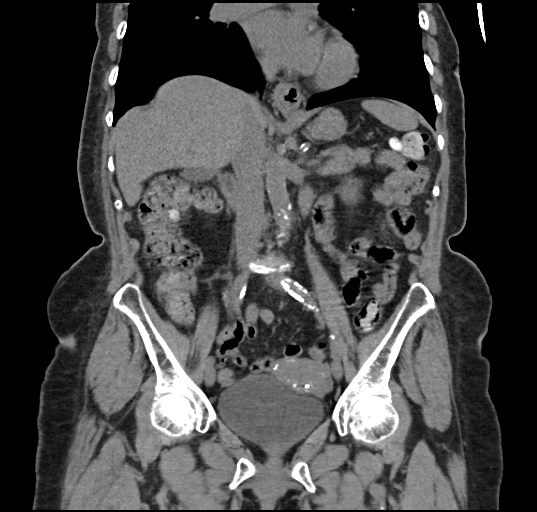
[im 56/101  soft-tissue]
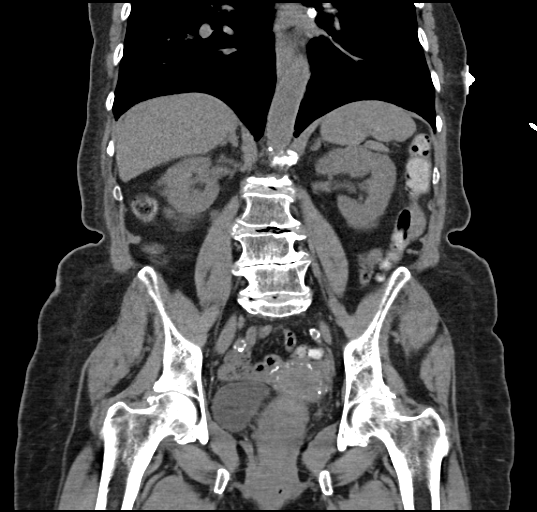

[16 of 46 positions shown; findings below may reference images not displayed]

FINDINGS: Lower Chest: Volume loss in the anterior right lung base with
similar atelectasis. Increased density in the anterior left lung
base is also due to subsegmental atelectasis and is similar.

Normal heart size without pericardial or pleural effusion. A small
hiatal hernia a with fluid level in the lower thoracic esophagus.

Abdomen/Pelvis: Motion degradation throughout the abdomen and upper
pelvis. Normal noncontrast appearance of the liver, spleen, distal
stomach, pancreas, gallbladder, biliary tract, adrenal glands,
kidneys.

Aortic and branch vessel atherosclerosis. No retroperitoneal or
retrocrural adenopathy. Colonic stool burden suggests constipation.
Normal terminal ileum and appendix. Normal small bowel without
abdominal ascites.

No pelvic adenopathy. Normal urinary bladder. Dystrophic uterine
calcifications. No adnexal mass or significant free fluid.

Fat containing ventral pelvic wall hernia.

Bones/Musculoskeletal: Lumbar spondylosis with mild wedging of the
L2 vertebral body, chronic.
IMPRESSION: 1. Moderate degradation, secondary to motion and lack of contrast.
2. Given this factor, no acute process.
3. Small hiatal hernia. Esophageal air fluid level suggests
dysmotility or gastroesophageal reflux.
4.  Possible constipation.
5. Fat containing ventral pelvic hernia.

## 2014-03-18 IMAGING — CR DG CHEST 2V
1 series · 1 of 1 positions shown · non-contrast
Comparison: DG CHEST 2 VIEW dated 01/21/2014

CLINICAL DATA: Shortness of breath. Sinus infection for 1 week.
Hypertension. Diabetes. Colon cancer. Prior mastectomy.

EXAM:
CHEST  2 VIEW

[w chest lat]
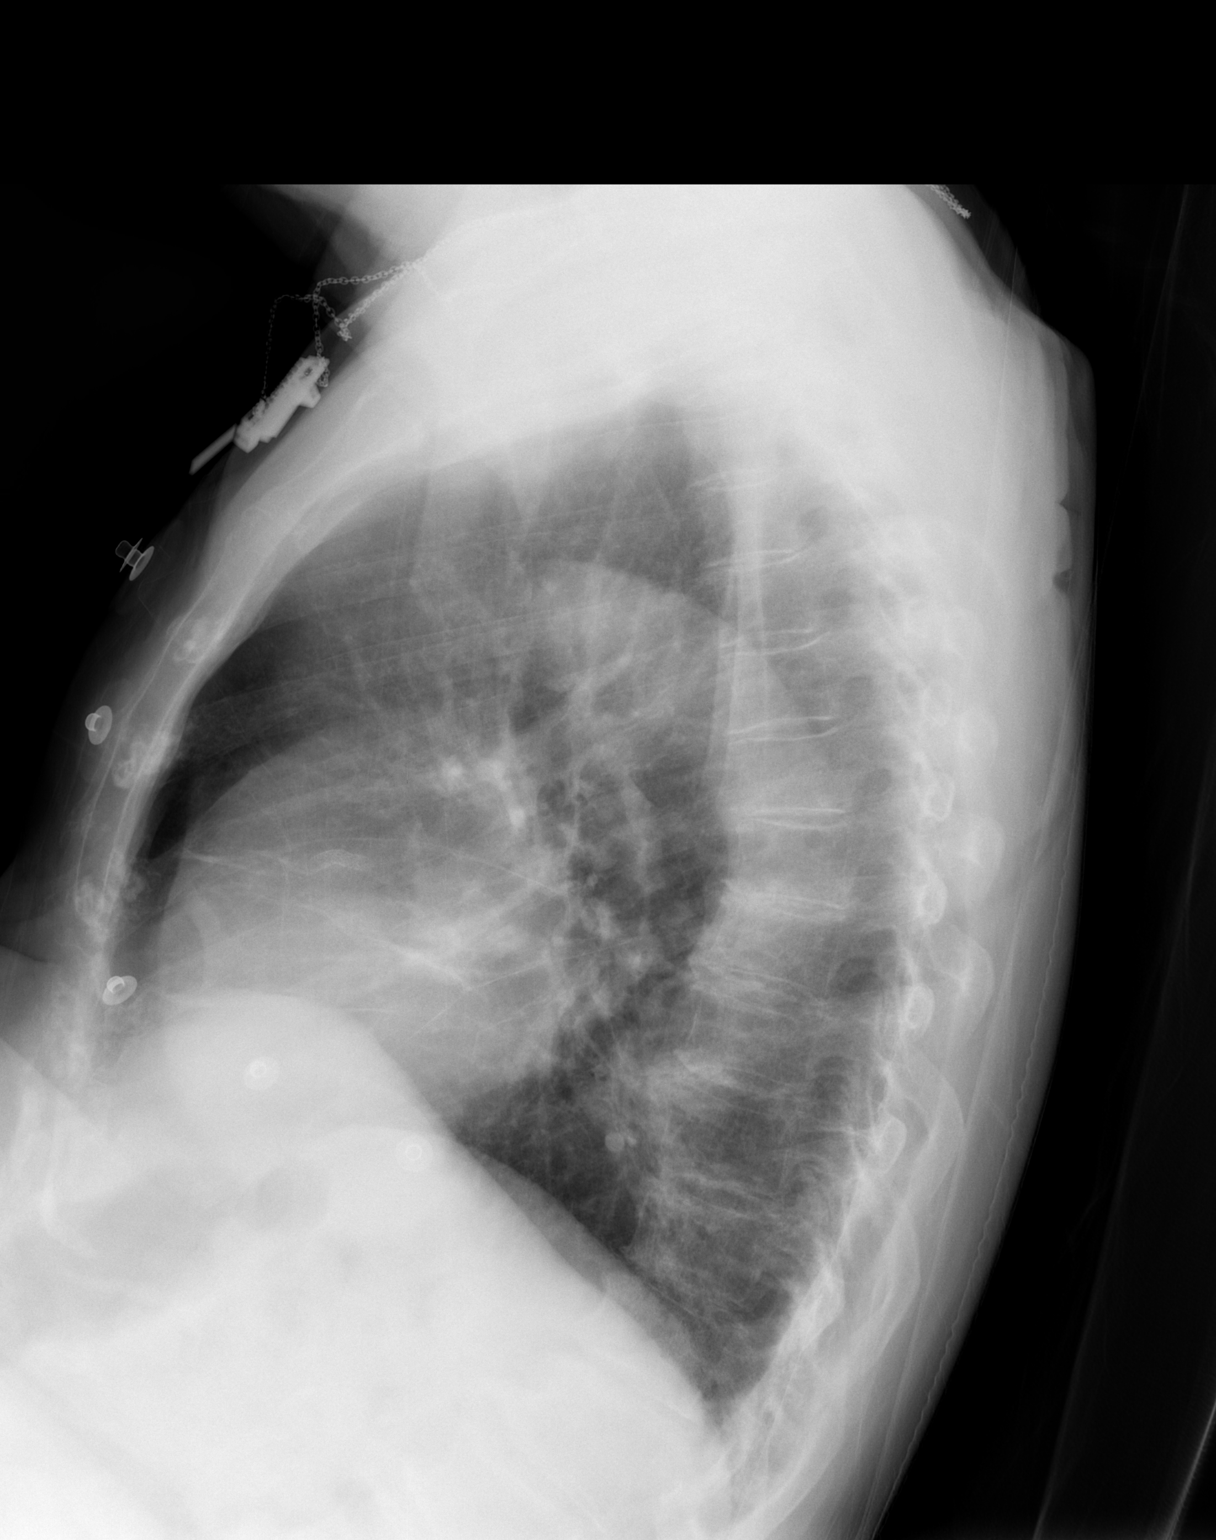

[1 of 1 positions shown; findings below may reference images not displayed]

FINDINGS: Moderate lower thoracic spondylosis. Right hemidiaphragm eventration
anteriorly. Midline trachea. Mild cardiomegaly with tortuous
descending thoracic aorta. Transverse aortic atherosclerosis. No
pleural effusion or pneumothorax. No congestive failure. Clear
lungs.
IMPRESSION: Cardiomegaly without congestive failure.

## 2014-03-25 ENCOUNTER — Ambulatory Visit (INDEPENDENT_AMBULATORY_CARE_PROVIDER_SITE_OTHER): Payer: Medicare Other | Admitting: Emergency Medicine

## 2014-03-25 ENCOUNTER — Encounter: Payer: Self-pay | Admitting: Emergency Medicine

## 2014-03-25 VITALS — BP 189/65 | HR 84 | Temp 98.0°F | Ht 59.0 in | Wt 123.7 lb

## 2014-03-25 DIAGNOSIS — M25559 Pain in unspecified hip: Secondary | ICD-10-CM | POA: Diagnosis not present

## 2014-03-25 DIAGNOSIS — F17200 Nicotine dependence, unspecified, uncomplicated: Secondary | ICD-10-CM | POA: Diagnosis not present

## 2014-03-25 DIAGNOSIS — M791 Myalgia, unspecified site: Secondary | ICD-10-CM

## 2014-03-25 DIAGNOSIS — R269 Unspecified abnormalities of gait and mobility: Secondary | ICD-10-CM | POA: Diagnosis not present

## 2014-03-25 DIAGNOSIS — IMO0001 Reserved for inherently not codable concepts without codable children: Secondary | ICD-10-CM

## 2014-03-25 DIAGNOSIS — R5381 Other malaise: Secondary | ICD-10-CM | POA: Diagnosis not present

## 2014-03-25 DIAGNOSIS — Z9181 History of falling: Secondary | ICD-10-CM | POA: Diagnosis not present

## 2014-03-25 DIAGNOSIS — I5032 Chronic diastolic (congestive) heart failure: Secondary | ICD-10-CM | POA: Diagnosis not present

## 2014-03-25 DIAGNOSIS — R5383 Other fatigue: Secondary | ICD-10-CM | POA: Diagnosis not present

## 2014-03-25 DIAGNOSIS — I359 Nonrheumatic aortic valve disorder, unspecified: Secondary | ICD-10-CM | POA: Diagnosis not present

## 2014-03-25 DIAGNOSIS — E119 Type 2 diabetes mellitus without complications: Secondary | ICD-10-CM | POA: Diagnosis not present

## 2014-03-25 DIAGNOSIS — M79609 Pain in unspecified limb: Secondary | ICD-10-CM | POA: Diagnosis not present

## 2014-03-25 MED ORDER — RANITIDINE HCL 150 MG PO CAPS: 150.0000 mg | ORAL_CAPSULE | Freq: Two times a day (BID) | ORAL | Status: DC | PRN

## 2014-03-25 MED ORDER — FUROSEMIDE 20 MG PO TABS: 20.0000 mg | ORAL_TABLET | Freq: Every day | ORAL | Status: DC

## 2014-03-25 MED ORDER — AMLODIPINE BESYLATE 10 MG PO TABS: 10.0000 mg | ORAL_TABLET | Freq: Every day | ORAL | Status: DC

## 2014-03-25 MED ORDER — MIRTAZAPINE 15 MG PO TABS: 15.0000 mg | ORAL_TABLET | Freq: Every day | ORAL | Status: DC

## 2014-03-25 MED ORDER — HYDROCODONE-ACETAMINOPHEN 5-325 MG PO TABS: 1.0000 | ORAL_TABLET | Freq: Every day | ORAL | Status: DC | PRN

## 2014-03-25 NOTE — Progress Notes (Signed)
   Subjective:    Patient ID: [name], female    DOB: [birth year], 80 y.o.   MRN: [id number]  HPI CAIDENCE KASEMAN is here for a same-day appointment for back pain.  She states that she has soreness through her buttocks and thighs for the last 2 days. It is worse on the right side. It does not radiate. Denies any numbness, tingling, weakness. She states on Sunday she walked on uneven grass, which she normally does not do. She also took out the trash which was slightly heavy, but she also normally does not do.  She also needs refills of several medications.  Current Outpatient Prescriptions on File Prior to Visit  Medication Sig Dispense Refill  . albuterol (PROVENTIL HFA;VENTOLIN HFA) 108 (90 BASE) MCG/ACT inhaler Inhale 2 puffs into the lungs every 6 (six) hours as needed. For shortness of breath  1 Inhaler  6  . aspirin (ASPIRIN CHILDRENS) 81 MG chewable tablet Chew 81 mg by mouth daily.        . carvedilol (COREG) 12.5 MG tablet Take 12.5 mg by mouth 2 (two) times daily with a meal.      . gabapentin (NEURONTIN) 300 MG capsule Take 1 capsule (300 mg total) by mouth 2 (two) times daily.  60 capsule  0  . losartan (COZAAR) 50 MG tablet Take 1 tablet (50 mg total) by mouth daily.  30 tablet  4  . magnesium hydroxide (MILK OF MAGNESIA) 400 MG/5ML suspension Take 30 mLs by mouth daily as needed for mild constipation.      . metFORMIN (GLUCOPHAGE) 500 MG tablet TAKE 1 TABLET (500 MG TOTAL) BY MOUTH 2 (TWO) TIMES DAILY WITH A MEAL.  60 tablet  5  . PATADAY 0.2 % SOLN Place 1 drop into both eyes daily as needed. For itchy eyes      . potassium chloride (K-DUR) 10 MEQ tablet Take 1 tablet (10 mEq total) by mouth daily. Take with the lasix  30 tablet  0  . rosuvastatin (CRESTOR) 20 MG tablet Take 20 mg by mouth daily.      . tamoxifen (NOLVADEX) 20 MG tablet Take 1 tablet (20 mg total) by mouth daily.  30 tablet  4  . [DISCONTINUED] cetirizine (ZYRTEC) 5 MG tablet Take 1 tablet (5 mg total) by  mouth daily. Take at night time as it can cause some drowsiness  30 tablet  4   No current facility-administered medications on file prior to visit.    I have reviewed and updated the following as appropriate: allergies and current medications SHx: non smoker   Review of Systems See HPI    Objective:   Physical Exam BP 189/65  Pulse 84  Temp(Src) 98 F (36.7 C) (Oral)  Ht 4\' 11"  (1.499 m)  Wt 123 lb 11.2 oz (56.11 kg)  BMI 24.97 kg/m2 Gen: alert, cooperative, NAD Back: no erythema or edema; no vertebral tenderness or step offs; mild tenderness over right buttock     Assessment & Plan:  Muscle soreness: Suspect secondary to increased activity level on Sunday. No red flags on history or exam. I refilled her pain medicine, Vicodin. She will followup if it does not gradually improve over the next week or 2.

## 2014-03-25 NOTE — Patient Instructions (Signed)
It was nice to meet you!  I think you overdid it a little on Mother's Day. Let your grandkids and great-grandkids do all the lifting.  I have refilled your medicines today.  Follow up with your regular doctor in 3 months or sooner as needed.

## 2014-03-27 ENCOUNTER — Encounter: Payer: Self-pay | Admitting: Surgery

## 2014-03-30 ENCOUNTER — Encounter: Payer: Self-pay | Admitting: Surgery

## 2014-03-30 ENCOUNTER — Ambulatory Visit (INDEPENDENT_AMBULATORY_CARE_PROVIDER_SITE_OTHER)
Admission: RE | Admit: 2014-03-30 | Discharge: 2014-03-30 | Disposition: A | Discharge status: Home / Self Care | Payer: Medicare Other | Source: Ambulatory Visit | Attending: Surgery | Admitting: Surgery

## 2014-03-30 ENCOUNTER — Ambulatory Visit (HOSPITAL_COMMUNITY)
Admission: RE | Admit: 2014-03-30 | Discharge: 2014-03-30 | Disposition: A | Discharge status: Home / Self Care | Payer: Medicare Other | Source: Ambulatory Visit | Attending: Surgery | Admitting: Surgery

## 2014-03-30 ENCOUNTER — Ambulatory Visit (INDEPENDENT_AMBULATORY_CARE_PROVIDER_SITE_OTHER): Payer: Medicare Other | Admitting: Surgery

## 2014-03-30 VITALS — BP 166/59 | HR 72 | Resp 16 | Ht 59.0 in | Wt 125.0 lb

## 2014-03-30 DIAGNOSIS — I739 Peripheral vascular disease, unspecified: Secondary | ICD-10-CM | POA: Diagnosis not present

## 2014-03-30 DIAGNOSIS — I10 Essential (primary) hypertension: Secondary | ICD-10-CM | POA: Insufficient documentation

## 2014-03-30 DIAGNOSIS — I70219 Atherosclerosis of native arteries of extremities with intermittent claudication, unspecified extremity: Secondary | ICD-10-CM | POA: Insufficient documentation

## 2014-03-30 DIAGNOSIS — R609 Edema, unspecified: Secondary | ICD-10-CM | POA: Diagnosis not present

## 2014-03-30 DIAGNOSIS — E119 Type 2 diabetes mellitus without complications: Secondary | ICD-10-CM | POA: Insufficient documentation

## 2014-03-30 DIAGNOSIS — E785 Hyperlipidemia, unspecified: Secondary | ICD-10-CM | POA: Insufficient documentation

## 2014-03-30 DIAGNOSIS — R600 Localized edema: Secondary | ICD-10-CM | POA: Insufficient documentation

## 2014-03-30 MED ORDER — GABAPENTIN 300 MG PO CAPS: 300.0000 mg | ORAL_CAPSULE | Freq: Two times a day (BID) | ORAL | Status: DC

## 2014-03-30 NOTE — Progress Notes (Signed)
[name] MRN: [id number] DOB: Feb 04, 1924 Sex: female   Referred by: Dr. Andria Frames  Reason for referral:  Chief Complaint  Patient presents with  . New Evaluation    c/o bilateral leg "heaviness" and swelling for 10 months  . PAD    HISTORY OF PRESENT ILLNESS: This is a 80 year old female who is here today for evaluation of bilateral feet problems.  She states that for the past 9-12 months she has been getting pain in her ankles as well as the bottom of her feet.  She describes this as a burning feeling.  She was given a trial of Neurontin which she said made things dramatically better.  She is not taking the Neurontin now because she ran out.  She does walk with a cane.  She denies any cramps with walking.  She also states that she feels her legs get very heavy, as if they are going to sleep, when she has been on her feet a lot.  The patient denies nonhealing wounds.  The patient is a diabetic.  Her blood sugars are moderately well controlled.  Her last hemoglobin A1c was 7.7.  The patient does take a statins for hypercholesterolemia.  She is medically managed for hypertension.  Past Medical History  Diagnosis Date  . Breast cancer 10/2010    Invasive Ductal Carcinoma, s/p bilateral mastectomy, now on Tamoxifen. Followed by Dr Jamse Arn.   . Diabetes mellitus   . Hypertension   . Hyperlipidemia   . Anemia     Iron def anemia  . CAD (coronary artery disease)   . CHF (congestive heart failure)     Systolic   . History of colon cancer   . Neuropathy   . Allergy   . GERD (gastroesophageal reflux disease)   . Hiatal hernia   . Diverticulosis   . Asthma   . Arthritis   . H/O: GI bleed   . Breast cancer 2011    s/p Bilateral masectomy  . Shortness of breath   . Pneumonia 03/2012  . Carotid artery aneurysm 08/2010    right ICA, 5 x 20mm  . DDD (degenerative disc disease), cervical   . Colon cancer   . Colon cancer 12/11/2013  . Myocardial infarction     Past  Surgical History  Procedure Laterality Date  . Breast masectomy  10/2010    Bilateral masectomy by Dr Lucia Gaskins s/p invasive ductal carcinoma.  . Cardiac  catherization  2007    Severe 3-vessel disease.  EF 20-25%.  . Esophagogastroduodenoscopy  2001    Esophageal Tear  . Total knee arthroplasty  2001  . Esophagogastroduodenoscopy  10/27/2012    Procedure: ESOPHAGOGASTRODUODENOSCOPY (EGD);  Surgeon: Irene Shipper, MD;  Location: [facility] ENDOSCOPY;  Service: Endoscopy;  Laterality: N/A;  pat  . Breast surgery      History   Social History  . Marital Status: Widowed    Spouse Name: N/A    Number of Children: N/A  . Years of Education: N/A   Occupational History  . Not on file.   Social History Main Topics  . Smoking status: Never Smoker   . Smokeless tobacco: Current User    Types: Snuff  . Alcohol Use: No  . Drug Use: No  . Sexual Activity: No   Other Topics Concern  . Not on file   Social History Narrative   Widowed.  She lives with her son and has 2 daughters who help with her  care.  She has a large extended family in the area who are very involved.     Family History  Problem Relation Age of Onset  . Sickle cell anemia Other   . Heart disease Mother   . Heart disease Father   . Cancer Daughter 6    breast ca  . Cancer Daughter 31    breast ca    Allergies as of 03/30/2014 - Review Complete 03/30/2014  Allergen Reaction Noted  . Peanuts [nuts] Swelling 05/02/2011  . Lisinopril Cough 01/31/2011    Current Outpatient Prescriptions on File Prior to Visit  Medication Sig Dispense Refill  . albuterol (PROVENTIL HFA;VENTOLIN HFA) 108 (90 BASE) MCG/ACT inhaler Inhale 2 puffs into the lungs every 6 (six) hours as needed. For shortness of breath  1 Inhaler  6  . amLODipine (NORVASC) 10 MG tablet Take 1 tablet (10 mg total) by mouth daily.  30 tablet  3  . aspirin (ASPIRIN CHILDRENS) 81 MG chewable tablet Chew 81 mg by mouth daily.        . carvedilol (COREG) 12.5 MG  tablet Take 12.5 mg by mouth 2 (two) times daily with a meal.      . furosemide (LASIX) 20 MG tablet Take 1 tablet (20 mg total) by mouth daily.  30 tablet  3  . gabapentin (NEURONTIN) 300 MG capsule Take 1 capsule (300 mg total) by mouth 2 (two) times daily.  60 capsule  0  . HYDROcodone-acetaminophen (NORCO/VICODIN) 5-325 MG per tablet Take 1 tablet by mouth daily as needed.  30 tablet  0  . losartan (COZAAR) 50 MG tablet Take 1 tablet (50 mg total) by mouth daily.  30 tablet  4  . magnesium hydroxide (MILK OF MAGNESIA) 400 MG/5ML suspension Take 30 mLs by mouth daily as needed for mild constipation.      . metFORMIN (GLUCOPHAGE) 500 MG tablet TAKE 1 TABLET (500 MG TOTAL) BY MOUTH 2 (TWO) TIMES DAILY WITH A MEAL.  60 tablet  5  . mirtazapine (REMERON) 15 MG tablet Take 1 tablet (15 mg total) by mouth at bedtime.  30 tablet  3  . PATADAY 0.2 % SOLN Place 1 drop into both eyes daily as needed. For itchy eyes      . potassium chloride (K-DUR) 10 MEQ tablet Take 1 tablet (10 mEq total) by mouth daily. Take with the lasix  30 tablet  0  . ranitidine (ZANTAC) 150 MG capsule Take 1 capsule (150 mg total) by mouth 2 (two) times daily as needed for heartburn.  60 capsule  3  . rosuvastatin (CRESTOR) 20 MG tablet Take 20 mg by mouth daily.      . tamoxifen (NOLVADEX) 20 MG tablet Take 1 tablet (20 mg total) by mouth daily.  30 tablet  4  . [DISCONTINUED] cetirizine (ZYRTEC) 5 MG tablet Take 1 tablet (5 mg total) by mouth daily. Take at night time as it can cause some drowsiness  30 tablet  4   No current facility-administered medications on file prior to visit.     REVIEW OF SYSTEMS: Cardiovascular: No chest pain, chest pressure, palpitations, orthopnea, or dyspnea on exertion. No claudication or rest pain,  No history of DVT or phlebitis. Pulmonary: No productive cough, asthma or wheezing. Neurologic: Burning and heaviness in both feet. Hematologic: No bleeding problems or clotting  disorders. Musculoskeletal: No joint pain or joint swelling. Gastrointestinal: No blood in stool or hematemesis Genitourinary: No dysuria or hematuria. Psychiatric:: No history of  major depression. Integumentary: No rashes or ulcers. Constitutional: No fever or chills.  PHYSICAL EXAMINATION: General: The patient appears their stated age.  Vital signs are BP 166/59  Pulse 72  Resp 16  Ht 4\' 11"  (1.499 m)  Wt 125 lb (56.7 kg)  BMI 25.23 kg/m2 HEENT:  No gross abnormalities Pulmonary: Respirations are non-labored Abdomen: Soft and non-tender  Musculoskeletal: There are no major deformities.   Neurologic: No focal weakness or paresthesias are detected, Skin: There are no ulcer or rashes noted. Psychiatric: The patient has normal affect. Cardiovascular: There is a regular rate and rhythm without significant murmur appreciated.  1+ bilateral edema  Diagnostic Studies: ABIs were ordered and reviewed today.  0.51 on the right and 0.62 on the left, all with monophasic waveforms.  The patient does have right superficial femoral and popliteal lesion as well as left superficial femoral lesion.  She has severe tibial disease.    Assessment:  Bilateral foot pain Plan: I feel that the patient's symptoms of pain in her feet are secondary to neuropathy as result of her diabetes.  This is further supported by the fact that she had significant improvement with the addition of Neurontin, which she ran out of.  She does not have classic complaints of claudication.  She does not get cramping with activity.  She did not have nonhealing wounds.  Ultrasound today did identify peripheral vascular disease with several areas of stenosis and tibial disease which is not surprising given her diabetes.  However, I do not think that her current symptoms are related to her peripheral vascular disease.  I have given the patient prescription for Neurontin.  I feel that the patient heaviness is secondary to the edema  she has in her legs.  I feel this is best treated with compression stockings.  I have given her a prescription for 15-20 mm compression knee high stockings.  This should help with her heaviness and edema.  I'm going to have her follow up in 6 months for repeat evaluation.     Eldridge Abrahams, M.D. [facility] Office: [phone] Pager:  [phone]  2

## 2014-03-30 NOTE — Addendum Note (Signed)
Addended by: Mena Goes on: 03/30/2014 02:03 PM   Modules accepted: Orders

## 2014-04-08 ENCOUNTER — Emergency Department (HOSPITAL_COMMUNITY): Payer: Medicare Other

## 2014-04-08 ENCOUNTER — Encounter (HOSPITAL_COMMUNITY): Payer: Self-pay | Admitting: Emergency Medicine

## 2014-04-08 ENCOUNTER — Emergency Department (HOSPITAL_COMMUNITY)
Admission: EM | Admit: 2014-04-08 | Discharge: 2014-04-08 | Disposition: A | Discharge status: Home / Self Care | Payer: Medicare Other | Attending: Emergency Medicine | Admitting: Emergency Medicine

## 2014-04-08 DIAGNOSIS — Z79899 Other long term (current) drug therapy: Secondary | ICD-10-CM | POA: Insufficient documentation

## 2014-04-08 DIAGNOSIS — Z9889 Other specified postprocedural states: Secondary | ICD-10-CM | POA: Insufficient documentation

## 2014-04-08 DIAGNOSIS — J45909 Unspecified asthma, uncomplicated: Secondary | ICD-10-CM | POA: Insufficient documentation

## 2014-04-08 DIAGNOSIS — Z85038 Personal history of other malignant neoplasm of large intestine: Secondary | ICD-10-CM | POA: Diagnosis not present

## 2014-04-08 DIAGNOSIS — E785 Hyperlipidemia, unspecified: Secondary | ICD-10-CM | POA: Diagnosis not present

## 2014-04-08 DIAGNOSIS — E119 Type 2 diabetes mellitus without complications: Secondary | ICD-10-CM | POA: Diagnosis not present

## 2014-04-08 DIAGNOSIS — W19XXXA Unspecified fall, initial encounter: Secondary | ICD-10-CM

## 2014-04-08 DIAGNOSIS — Y939 Activity, unspecified: Secondary | ICD-10-CM | POA: Insufficient documentation

## 2014-04-08 DIAGNOSIS — Z8701 Personal history of pneumonia (recurrent): Secondary | ICD-10-CM | POA: Diagnosis not present

## 2014-04-08 DIAGNOSIS — I5020 Unspecified systolic (congestive) heart failure: Secondary | ICD-10-CM | POA: Insufficient documentation

## 2014-04-08 DIAGNOSIS — K219 Gastro-esophageal reflux disease without esophagitis: Secondary | ICD-10-CM | POA: Diagnosis not present

## 2014-04-08 DIAGNOSIS — R29818 Other symptoms and signs involving the nervous system: Secondary | ICD-10-CM | POA: Diagnosis not present

## 2014-04-08 DIAGNOSIS — I10 Essential (primary) hypertension: Secondary | ICD-10-CM | POA: Insufficient documentation

## 2014-04-08 DIAGNOSIS — R296 Repeated falls: Secondary | ICD-10-CM | POA: Insufficient documentation

## 2014-04-08 DIAGNOSIS — M5030 Other cervical disc degeneration, unspecified cervical region: Secondary | ICD-10-CM | POA: Insufficient documentation

## 2014-04-08 DIAGNOSIS — Z7982 Long term (current) use of aspirin: Secondary | ICD-10-CM | POA: Insufficient documentation

## 2014-04-08 DIAGNOSIS — Y92009 Unspecified place in unspecified non-institutional (private) residence as the place of occurrence of the external cause: Secondary | ICD-10-CM | POA: Insufficient documentation

## 2014-04-08 DIAGNOSIS — M546 Pain in thoracic spine: Secondary | ICD-10-CM | POA: Diagnosis not present

## 2014-04-08 DIAGNOSIS — Z862 Personal history of diseases of the blood and blood-forming organs and certain disorders involving the immune mechanism: Secondary | ICD-10-CM | POA: Insufficient documentation

## 2014-04-08 DIAGNOSIS — IMO0002 Reserved for concepts with insufficient information to code with codable children: Secondary | ICD-10-CM | POA: Diagnosis not present

## 2014-04-08 DIAGNOSIS — G629 Polyneuropathy, unspecified: Secondary | ICD-10-CM

## 2014-04-08 DIAGNOSIS — G5790 Unspecified mononeuropathy of unspecified lower limb: Secondary | ICD-10-CM | POA: Diagnosis not present

## 2014-04-08 DIAGNOSIS — Z853 Personal history of malignant neoplasm of breast: Secondary | ICD-10-CM | POA: Insufficient documentation

## 2014-04-08 DIAGNOSIS — I252 Old myocardial infarction: Secondary | ICD-10-CM | POA: Insufficient documentation

## 2014-04-08 DIAGNOSIS — I2510 Atherosclerotic heart disease of native coronary artery without angina pectoris: Secondary | ICD-10-CM | POA: Diagnosis not present

## 2014-04-08 DIAGNOSIS — S0990XA Unspecified injury of head, initial encounter: Secondary | ICD-10-CM | POA: Insufficient documentation

## 2014-04-08 DIAGNOSIS — G609 Hereditary and idiopathic neuropathy, unspecified: Secondary | ICD-10-CM | POA: Diagnosis not present

## 2014-04-08 LAB — BASIC METABOLIC PANEL
BUN: 13 mg/dL (ref 6–23)
CHLORIDE: 101 meq/L (ref 96–112)
CO2: 21 meq/L (ref 19–32)
CREATININE: 0.92 mg/dL (ref 0.50–1.10)
Calcium: 11.1 mg/dL — ABNORMAL HIGH (ref 8.4–10.5)
GFR calc Af Amer: 63 mL/min — ABNORMAL LOW (ref >90)
GFR calc non Af Amer: 54 mL/min — ABNORMAL LOW (ref >90)
Glucose, Bld: 233 mg/dL — ABNORMAL HIGH (ref 70–99)
Potassium: 4.1 mEq/L (ref 3.7–5.3)
Sodium: 138 mEq/L (ref 137–147)

## 2014-04-08 LAB — URINALYSIS, ROUTINE W REFLEX MICROSCOPIC
Bilirubin Urine: NEGATIVE
GLUCOSE, UA: 100 mg/dL
HGB URINE DIPSTICK: NEGATIVE
KETONES UR: NEGATIVE mg/dL
LEUKOCYTES UA: NEGATIVE
Nitrite: NEGATIVE
PH: 5 (ref 5.0–8.0)
Protein, ur: NEGATIVE mg/dL
Specific Gravity, Urine: 1.018 (ref 1.005–1.030)
Urobilinogen, UA: 0.2 mg/dL (ref 0.0–1.0)

## 2014-04-08 LAB — CBC WITH DIFFERENTIAL/PLATELET
BASOS ABS: 0 10*3/uL (ref 0.0–0.1)
Basophils Relative: 1 % (ref 0–1)
Eosinophils Absolute: 0.1 10*3/uL (ref 0.0–0.7)
Eosinophils Relative: 3 % (ref 0–5)
HEMATOCRIT: 35.4 % — AB (ref 36.0–46.0)
Hemoglobin: 11.5 g/dL — ABNORMAL LOW (ref 12.0–15.0)
Lymphocytes Relative: 39 % (ref 12–46)
Lymphs Abs: 1.5 10*3/uL (ref 0.7–4.0)
MCH: 23.2 pg — ABNORMAL LOW (ref 26.0–34.0)
MCHC: 32.5 g/dL (ref 30.0–36.0)
MCV: 71.4 fL — ABNORMAL LOW (ref 78.0–100.0)
MONOS PCT: 7 % (ref 3–12)
Monocytes Absolute: 0.3 10*3/uL (ref 0.1–1.0)
NEUTROS ABS: 2 10*3/uL (ref 1.7–7.7)
Neutrophils Relative %: 51 % (ref 43–77)
Platelets: 170 10*3/uL (ref 150–400)
RBC: 4.96 MIL/uL (ref 3.87–5.11)
RDW: 16.1 % — AB (ref 11.5–15.5)
WBC: 3.9 10*3/uL — ABNORMAL LOW (ref 4.0–10.5)

## 2014-04-08 LAB — CBG MONITORING, ED: GLUCOSE-CAPILLARY: 296 mg/dL — AB (ref 70–99)

## 2014-04-08 LAB — URINE MICROSCOPIC-ADD ON

## 2014-04-08 NOTE — ED Notes (Signed)
Pt ambulated well, discharged to home with daughter.

## 2014-04-08 NOTE — ED Notes (Signed)
Pt. Stated, I started having numbness in my both legs and feet. I went to the vein center last week and gave me some medicine but its no better.  They said my sugar is messing it up.

## 2014-04-08 NOTE — Discharge Instructions (Signed)
Neuropathic Pain We often think that pain has a physical cause. If we get rid of the cause, the pain should go away. Nerves themselves can also cause pain. It is called neuropathic pain, which means nerve abnormality. It may be difficult for the patients who have it and for the treating caregivers. Pain is usually described as acute (short-lived) or chronic (long-lasting). Acute pain is related to the physical sensations caused by an injury. It can last from a few seconds to many weeks, but it usually goes away when normal healing occurs. Chronic pain lasts beyond the typical healing time. With neuropathic pain, the nerve fibers themselves may be damaged or injured. They then send incorrect signals to other pain centers. The pain you feel is real, but the cause is not easy to find.  CAUSES  Chronic pain can result from diseases, such as diabetes and shingles (an infection related to chickenpox), or from trauma, surgery, or amputation. It can also happen without any known injury or disease. The nerves are sending pain messages, even though there is no identifiable cause for such messages.   Other common causes of neuropathy include diabetes, phantom limb pain, or Regional Pain Syndrome (RPS).  As with all forms of chronic back pain, if neuropathy is not correctly treated, there can be a number of associated problems that lead to a downward cycle for the patient. These include depression, sleeplessness, feelings of fear and anxiety, limited social interaction and inability to do normal daily activities or work.  The most dramatic and mysterious example of neuropathic pain is called "phantom limb syndrome." This occurs when an arm or a leg has been removed because of illness or injury. The brain still gets pain messages from the nerves that originally carried impulses from the missing limb. These nerves now seem to misfire and cause troubling pain.  Neuropathic pain often seems to have no cause. It responds  poorly to standard pain treatment. Neuropathic pain can occur after:  Shingles (herpes zoster virus infection).  A lasting burning sensation of the skin, caused usually by injury to a peripheral nerve.  Peripheral neuropathy which is widespread nerve damage, often caused by diabetes or alcoholism.  Phantom limb pain following an amputation.  Facial nerve problems (trigeminal neuralgia).  Multiple sclerosis.  Reflex sympathetic dystrophy.  Pain which comes with cancer and cancer chemotherapy.  Entrapment neuropathy such as when pressure is put on a nerve such as in carpal tunnel syndrome.  Back, leg, and hip problems (sciatica).  Spine or back surgery.  HIV Infection or AIDS where nerves are infected by viruses. Your caregiver can explain items in the above list which may apply to you. SYMPTOMS  Characteristics of neuropathic pain are:  Severe, sharp, electric shock-like, shooting, lightening-like, knife-like.  Pins and needles sensation.  Deep burning, deep cold, or deep ache.  Persistent numbness, tingling, or weakness.  Pain resulting from light touch or other stimulus that would not usually cause pain.  Increased sensitivity to something that would normally cause pain, such as a pinprick. Pain may persist for months or years following the healing of damaged tissues. When this happens, pain signals no longer sound an alarm about current injuries or injuries about to happen. Instead, the alarm system itself is not working correctly.  Neuropathic pain may get worse instead of better over time. For some people, it can lead to serious disability. It is important to be aware that severe injury in a limb can occur without a proper, protective pain  response.Burns, cuts, and other injuries may go unnoticed. Without proper treatment, these injuries can become infected or lead to further disability. Take any injury seriously, and consult your caregiver for treatment. DIAGNOSIS    When you have a pain with no known cause, your caregiver will probably ask some specific questions:   Do you have any other conditions, such as diabetes, shingles, multiple sclerosis, or HIV infection?  How would you describe your pain? (Neuropathic pain is often described as shooting, stabbing, burning, or searing.)  Is your pain worse at any time of the day? (Neuropathic pain is usually worse at night.)  Does the pain seem to follow a certain physical pathway?  Does the pain come from an area that has missing or injured nerves? (An example would be phantom limb pain.)  Is the pain triggered by minor things such as rubbing against the sheets at night? These questions often help define the type of pain involved. Once your caregiver knows what is happening, treatment can begin. Anticonvulsant, antidepressant drugs, and various pain relievers seem to work in some cases. If another condition, such as diabetes is involved, better management of that disorder may relieve the neuropathic pain.  TREATMENT  Neuropathic pain is frequently long-lasting and tends not to respond to treatment with narcotic type pain medication. It may respond well to other drugs such as antiseizure and antidepressant medications. Usually, neuropathic problems do not completely go away, but partial improvement is often possible with proper treatment. Your caregivers have large numbers of medications available to treat you. Do not be discouraged if you do not get immediate relief. Sometimes different medications or a combination of medications will be tried before you receive the results you are hoping for. See your caregiver if you have pain that seems to be coming from nowhere and does not go away. Help is available.  SEEK IMMEDIATE MEDICAL CARE IF:   There is a sudden change in the quality of your pain, especially if the change is on only one side of the body.  You notice changes of the skin, such as redness, black or  purple discoloration, swelling, or an ulcer.  You cannot move the affected limbs. Document Released: 07/27/2004 Document Revised: 01/22/2012 Document Reviewed: 07/27/2004 [facility] Patient Information 2014 [place].  Fall Prevention and Home Safety Falls cause injuries and can affect all age groups. It is possible to use preventive measures to significantly decrease the likelihood of falls. There are many simple measures which can make your home safer and prevent falls. OUTDOORS  Repair cracks and edges of walkways and driveways.  Remove high doorway thresholds.  Trim shrubbery on the main path into your home.  Have good outside lighting.  Clear walkways of tools, rocks, debris, and clutter.  Check that handrails are not broken and are securely fastened. Both sides of steps should have handrails.  Have leaves, snow, and ice cleared regularly.  Use sand or salt on walkways during winter months.  In the garage, clean up grease or oil spills. BATHROOM  Install night lights.  Install grab bars by the toilet and in the tub and shower.  Use non-skid mats or decals in the tub or shower.  Place a plastic non-slip stool in the shower to sit on, if needed.  Keep floors dry and clean up all water on the floor immediately.  Remove soap buildup in the tub or shower on a regular basis.  Secure bath mats with non-slip, double-sided rug tape.  Remove throw rugs  and tripping hazards from the floors. BEDROOMS  Install night lights.  Make sure a bedside light is easy to reach.  Do not use oversized bedding.  Keep a telephone by your bedside.  Have a firm chair with side arms to use for getting dressed.  Remove throw rugs and tripping hazards from the floor. KITCHEN  Keep handles on pots and pans turned toward the center of the stove. Use back burners when possible.  Clean up spills quickly and allow time for drying.  Avoid walking on wet floors.  Avoid hot utensils  and knives.  Position shelves so they are not too high or low.  Place commonly used objects within easy reach.  If necessary, use a sturdy step stool with a grab bar when reaching.  Keep electrical cables out of the way.  Do not use floor polish or wax that makes floors slippery. If you must use wax, use non-skid floor wax.  Remove throw rugs and tripping hazards from the floor. STAIRWAYS  Never leave objects on stairs.  Place handrails on both sides of stairways and use them. Fix any loose handrails. Make sure handrails on both sides of the stairways are as long as the stairs.  Check carpeting to make sure it is firmly attached along stairs. Make repairs to worn or loose carpet promptly.  Avoid placing throw rugs at the top or bottom of stairways, or properly secure the rug with carpet tape to prevent slippage. Get rid of throw rugs, if possible.  Have an electrician put in a light switch at the top and bottom of the stairs. OTHER FALL PREVENTION TIPS  Wear low-heel or rubber-soled shoes that are supportive and fit well. Wear closed toe shoes.  When using a stepladder, make sure it is fully opened and both spreaders are firmly locked. Do not climb a closed stepladder.  Add color or contrast paint or tape to grab bars and handrails in your home. Place contrasting color strips on first and last steps.  Learn and use mobility aids as needed. Install an electrical emergency response system.  Turn on lights to avoid dark areas. Replace light bulbs that burn out immediately. Get light switches that glow.  Arrange furniture to create clear pathways. Keep furniture in the same place.  Firmly attach carpet with non-skid or double-sided tape.  Eliminate uneven floor surfaces.  Select a carpet pattern that does not visually hide the edge of steps.  Be aware of all pets. OTHER HOME SAFETY TIPS  Set the water temperature for 120 F (48.8 C).  Keep emergency numbers on or near  the telephone.  Keep smoke detectors on every level of the home and near sleeping areas. Document Released: 10/20/2002 Document Revised: 04/30/2012 Document Reviewed: 01/19/2012 [facility] Patient Information 2014 [place].

## 2014-04-08 NOTE — ED Provider Notes (Signed)
CSN: [id number]     Arrival date & time 04/08/14  1620 History   First MD Initiated Contact with Patient 04/08/14 1653     Chief Complaint  Patient presents with  . Numbness     (Consider location/radiation/quality/duration/timing/severity/associated sxs/prior Treatment) HPI Comments: Pt states she was in kitchen today when her leg gave out and she fell backwards, landing on back of head and mid back. No LOC.   Patient is a 80 y.o. female presenting with fall.  Fall This is a new problem. The current episode started 3 to 5 hours ago. The problem occurs rarely. The problem has not changed since onset.Associated symptoms include headaches. Pertinent negatives include no chest pain, no abdominal pain and no shortness of breath. Associated symptoms comments: Thoracic Back pain since fall, 4 months of BLLE pain from knees distally described as numbness and heaviness, worse w/ walking, better laying down. . Nothing aggravates the symptoms. Nothing relieves the symptoms. She has tried nothing for the symptoms. The treatment provided no relief.    Past Medical History  Diagnosis Date  . Breast cancer 10/2010    Invasive Ductal Carcinoma, s/p bilateral mastectomy, now on Tamoxifen. Followed by Dr Jamse Arn.   . Diabetes mellitus   . Hypertension   . Hyperlipidemia   . Anemia     Iron def anemia  . CAD (coronary artery disease)   . CHF (congestive heart failure)     Systolic   . History of colon cancer   . Neuropathy   . Allergy   . GERD (gastroesophageal reflux disease)   . Hiatal hernia   . Diverticulosis   . Asthma   . Arthritis   . H/O: GI bleed   . Breast cancer 2011    s/p Bilateral masectomy  . Shortness of breath   . Pneumonia 03/2012  . Carotid artery aneurysm 08/2010    right ICA, 5 x 56mm  . DDD (degenerative disc disease), cervical   . Colon cancer   . Colon cancer 12/11/2013  . Myocardial infarction    Past Surgical History  Procedure Laterality Date  . Breast  masectomy  10/2010    Bilateral masectomy by Dr Lucia Gaskins s/p invasive ductal carcinoma.  . Cardiac  catherization  2007    Severe 3-vessel disease.  EF 20-25%.  . Esophagogastroduodenoscopy  2001    Esophageal Tear  . Total knee arthroplasty  2001  . Esophagogastroduodenoscopy  10/27/2012    Procedure: ESOPHAGOGASTRODUODENOSCOPY (EGD);  Surgeon: Irene Shipper, MD;  Location: [facility] ENDOSCOPY;  Service: Endoscopy;  Laterality: N/A;  pat  . Breast surgery     Family History  Problem Relation Age of Onset  . Sickle cell anemia Other   . Heart disease Mother   . Heart disease Father   . Cancer Daughter 41    breast ca  . Cancer Daughter 61    breast ca   History  Substance Use Topics  . Smoking status: Never Smoker   . Smokeless tobacco: Current User    Types: Snuff  . Alcohol Use: No   OB History   Grav Para Term Preterm Abortions TAB SAB Ect Mult Living                 Review of Systems  Constitutional: Negative for fever, chills, diaphoresis, activity change, appetite change and fatigue.  HENT: Negative for congestion, facial swelling, rhinorrhea and sore throat.   Eyes: Negative for photophobia and discharge.  Respiratory: Negative for cough, chest  tightness and shortness of breath.   Cardiovascular: Positive for leg swelling. Negative for chest pain and palpitations.  Gastrointestinal: Negative for nausea, vomiting, abdominal pain and diarrhea.  Endocrine: Negative for polydipsia and polyuria.  Genitourinary: Negative for dysuria, frequency, difficulty urinating and pelvic pain.  Musculoskeletal: Positive for back pain. Negative for arthralgias, neck pain and neck stiffness.  Skin: Negative for color change and wound.  Allergic/Immunologic: Negative for immunocompromised state.  Neurological: Positive for headaches. Negative for facial asymmetry, weakness and numbness.  Hematological: Does not bruise/bleed easily.  Psychiatric/Behavioral: Negative for confusion and agitation.       Allergies  Peanuts and Lisinopril  Home Medications   Prior to Admission medications   Medication Sig Start Date End Date Taking? Authorizing Provider  albuterol (PROVENTIL HFA;VENTOLIN HFA) 108 (90 BASE) MCG/ACT inhaler Inhale 2 puffs into the lungs every 6 (six) hours as needed. For shortness of breath 01/31/13  Yes Kandis Nab, MD  amLODipine (NORVASC) 10 MG tablet Take 1 tablet (10 mg total) by mouth daily. 03/25/14  Yes Melony Overly, MD  aspirin (ASPIRIN CHILDRENS) 81 MG chewable tablet Chew 81 mg by mouth daily.     Yes Historical Provider, MD  carvedilol (COREG) 12.5 MG tablet Take 12.5 mg by mouth 2 (two) times daily with a meal.   Yes Historical Provider, MD  furosemide (LASIX) 20 MG tablet Take 1 tablet (20 mg total) by mouth daily. 03/25/14  Yes Melony Overly, MD  gabapentin (NEURONTIN) 300 MG capsule Take 1 capsule (300 mg total) by mouth 2 (two) times daily. 01/25/14  Yes Veryl Speak, MD  HYDROcodone-acetaminophen (NORCO/VICODIN) 5-325 MG per tablet Take 1 tablet by mouth daily as needed. 03/25/14  Yes Melony Overly, MD  losartan (COZAAR) 50 MG tablet Take 1 tablet (50 mg total) by mouth daily. 10/21/13  Yes Kandis Nab, MD  magnesium hydroxide (MILK OF MAGNESIA) 400 MG/5ML suspension Take 30 mLs by mouth daily as needed for mild constipation.   Yes Historical Provider, MD  metFORMIN (GLUCOPHAGE) 500 MG tablet TAKE 1 TABLET (500 MG TOTAL) BY MOUTH 2 (TWO) TIMES DAILY WITH A MEAL. 01/30/14  Yes Kandis Nab, MD  mirtazapine (REMERON) 15 MG tablet Take 1 tablet (15 mg total) by mouth at bedtime. 03/25/14  Yes Melony Overly, MD  PATADAY 0.2 % SOLN Place 1 drop into both eyes daily as needed. For itchy eyes 03/05/11  Yes Historical Provider, MD  potassium chloride (K-DUR) 10 MEQ tablet Take 1 tablet (10 mEq total) by mouth daily. Take with the lasix 01/14/14  Yes Kandis Nab, MD  ranitidine (ZANTAC) 150 MG capsule Take 1 capsule (150 mg total) by mouth 2 (two) times daily  as needed for heartburn. 03/25/14  Yes Melony Overly, MD  rosuvastatin (CRESTOR) 20 MG tablet Take 20 mg by mouth daily.   Yes Historical Provider, MD  tamoxifen (NOLVADEX) 20 MG tablet Take 1 tablet (20 mg total) by mouth daily. 03/03/14  Yes Kandis Nab, MD   BP 152/50  Pulse 72  Temp(Src) 97.7 F (36.5 C) (Oral)  Resp 18  Ht 4\' 11"  (1.499 m)  Wt 125 lb (56.7 kg)  BMI 25.23 kg/m2  SpO2 100% Physical Exam  Constitutional: She is oriented to person, place, and time. She appears well-developed and well-nourished. No distress.  HENT:  Head: Normocephalic and atraumatic.  Mouth/Throat: No oropharyngeal exudate.  Eyes: Pupils are equal, round, and reactive to light.  Neck: Normal range of  motion. Neck supple.  Cardiovascular: Normal rate, regular rhythm and normal heart sounds.  Exam reveals no gallop and no friction rub.   No murmur heard. Pulmonary/Chest: Effort normal and breath sounds normal. No respiratory distress. She has no wheezes. She has no rales.  Abdominal: Soft. Bowel sounds are normal. She exhibits no distension and no mass. There is no tenderness. There is no rebound and no guarding.  Musculoskeletal: Normal range of motion. She exhibits edema (2+ blle). She exhibits no tenderness.       Thoracic back: She exhibits bony tenderness.       Back:  Neurological: She is alert and oriented to person, place, and time. She has normal strength. She displays no tremor. No cranial nerve deficit or sensory deficit. She exhibits normal muscle tone. GCS eye subscore is 4. GCS verbal subscore is 5. GCS motor subscore is 6.  Skin: Skin is warm and dry.  Psychiatric: She has a normal mood and affect.    ED Course  Procedures (including critical care time) Labs Review Labs Reviewed  CBG MONITORING, ED - Abnormal; Notable for the following:    Glucose-Capillary 296 (*)    All other components within normal limits  URINE CULTURE  CBC WITH DIFFERENTIAL  BASIC METABOLIC PANEL    URINALYSIS, ROUTINE W REFLEX MICROSCOPIC    Imaging Review No results found.   EKG Interpretation None      MDM   Final diagnoses:  None    Pt is a 80 y.o. female with Pmhx as above who presents with 4 months of BLLE pain from knees distally, dx by her vasc/vein specialist as neuropathy with tx of neurontin. Today she had a fall which prompted her daughter to have her come to the ED. She states he legs don't hurt now, but that she has minor occipital pain and moderate mid thoracic back pain. No focal neuro findings.   CT head, s-spine, XR t spine w/o acute traumatic injury. Pt ambulated w/o difficulty. Wil d/c hoem w/ instructions to f/u with PCP, continue neurontin. Return precautions given for new or worsening symptoms including worsening pain, fever, numbness, weakness.          Neta Ehlers, MD 04/08/14 2101

## 2014-04-08 NOTE — ED Notes (Signed)
Pt states she has hx of DM and neuropathy but it has been worse recently

## 2014-04-08 NOTE — ED Notes (Signed)
The pt was taken to xray  15 minutes ago

## 2014-04-08 NOTE — ED Notes (Signed)
The pt has bi-lateral strong pulses.  Both feet warm and color is good

## 2014-04-08 NOTE — ED Notes (Signed)
Phlebotomy at the bedside  

## 2014-04-08 NOTE — ED Notes (Signed)
The pt has had bi-lateral leg numbness and bi-lateral feet numbness also for one month.  Both feet swollen just today no pain anywhere..  Alert no distress.  She was told at the vein clinic that her sugar has been elevated from the leg numbness

## 2014-04-10 LAB — URINE CULTURE: Colony Count: >=100000

## 2014-04-11 ENCOUNTER — Telehealth (HOSPITAL_BASED_OUTPATIENT_CLINIC_OR_DEPARTMENT_OTHER): Payer: Self-pay | Admitting: Emergency Medicine

## 2014-04-11 ENCOUNTER — Other Ambulatory Visit: Payer: Self-pay | Admitting: Family Medicine

## 2014-04-11 NOTE — Telephone Encounter (Signed)
Post ED Visit - Positive Culture Follow-up: Successful Patient Follow-Up  Culture assessed and recommendations reviewed by: []  Wes [place], Pharm.D., BCPS []  Heide Guile, Pharm.D., BCPS [x]  Alycia Rossetti, Pharm.D., BCPS []  [place], [place].D., BCPS, AAHIVP []  Legrand [place], Pharm.D., BCPS, AAHIVP  Positive urine culture  [x]  Patient discharged without antimicrobial prescription and treatment is now indicated []  Organism is resistant to prescribed ED discharge antimicrobial []  Patient with positive blood cultures  Changes discussed with ED provider: Vernie Murders PA-C New antibiotic prescription: Keflex 500 mg BID x 7 days    [facility] 04/11/2014, 6:19 PM

## 2014-04-12 NOTE — Progress Notes (Signed)
ED Antimicrobial Stewardship Positive Culture Follow Up   [name] is an 80 y.o. female who presented to [facility] on 04/08/2014 with a chief complaint of B/L LE numbness   Chief Complaint  Patient presents with  . Numbness    Recent Results (from the past 720 hour(s))  URINE CULTURE     Status: None   Collection Time    04/08/14  6:11 PM      Result Value Ref Range Status   Specimen Description URINE, CLEAN CATCH   Final   Special Requests NONE   Final   Culture  Setup Time     Final   Value: 04/08/2014 18:36     Performed at [facility]     Final   Value: >=100,000 COLONIES/ML     Performed at [facility]   Culture     Final   Value: ESCHERICHIA COLI     Performed at [facility]   Report Status 04/10/2014 FINAL   Final   Organism ID, Bacteria ESCHERICHIA COLI   Final    [x]  Patient discharged originally without antimicrobial agent and treatment is now indicated  New antibiotic prescription: Keflex 500 mg bid x 7 days  ED Provider: Vernie Murders PA-C  Rolla Flatten 04/12/2014, 3:30 PM Infectious Diseases Pharmacist Phone# [phone]

## 2014-04-12 NOTE — Telephone Encounter (Signed)
Rx for Keflex 500 mg BID x 7 days, prescribed by Vernie Murders PA-C, called in to [facility] on [address] [phone]) by Georgina Peer PFM.

## 2014-04-28 ENCOUNTER — Encounter: Payer: Self-pay | Admitting: Emergency Medicine

## 2014-04-28 ENCOUNTER — Ambulatory Visit (INDEPENDENT_AMBULATORY_CARE_PROVIDER_SITE_OTHER): Payer: Medicare Other | Admitting: Emergency Medicine

## 2014-04-28 VITALS — BP 158/70 | HR 72 | Temp 98.3°F | Wt 122.0 lb

## 2014-04-28 DIAGNOSIS — M109 Gout, unspecified: Secondary | ICD-10-CM

## 2014-04-28 DIAGNOSIS — IMO0001 Reserved for inherently not codable concepts without codable children: Secondary | ICD-10-CM | POA: Diagnosis not present

## 2014-04-28 DIAGNOSIS — I359 Nonrheumatic aortic valve disorder, unspecified: Secondary | ICD-10-CM | POA: Diagnosis not present

## 2014-04-28 DIAGNOSIS — M79609 Pain in unspecified limb: Secondary | ICD-10-CM | POA: Diagnosis not present

## 2014-04-28 DIAGNOSIS — M79605 Pain in left leg: Secondary | ICD-10-CM

## 2014-04-28 DIAGNOSIS — E119 Type 2 diabetes mellitus without complications: Secondary | ICD-10-CM | POA: Diagnosis not present

## 2014-04-28 DIAGNOSIS — M25559 Pain in unspecified hip: Secondary | ICD-10-CM | POA: Diagnosis not present

## 2014-04-28 DIAGNOSIS — R112 Nausea with vomiting, unspecified: Secondary | ICD-10-CM | POA: Diagnosis present

## 2014-04-28 DIAGNOSIS — F17200 Nicotine dependence, unspecified, uncomplicated: Secondary | ICD-10-CM | POA: Diagnosis not present

## 2014-04-28 DIAGNOSIS — Z9181 History of falling: Secondary | ICD-10-CM | POA: Diagnosis not present

## 2014-04-28 DIAGNOSIS — R269 Unspecified abnormalities of gait and mobility: Secondary | ICD-10-CM | POA: Diagnosis not present

## 2014-04-28 DIAGNOSIS — M79604 Pain in right leg: Secondary | ICD-10-CM

## 2014-04-28 DIAGNOSIS — R5381 Other malaise: Secondary | ICD-10-CM | POA: Diagnosis not present

## 2014-04-28 MED ORDER — HYDROCODONE-ACETAMINOPHEN 5-325 MG PO TABS: 1.0000 | ORAL_TABLET | Freq: Every day | ORAL | Status: DC | PRN

## 2014-04-28 MED ORDER — KETOROLAC TROMETHAMINE 60 MG/2ML IM SOLN
30.0000 mg | Freq: Once | INTRAMUSCULAR | Status: AC
  Administered 2014-04-28: 30 mg via INTRAMUSCULAR

## 2014-04-28 MED ORDER — COLCHICINE 0.6 MG PO TABS: 0.6000 mg | ORAL_TABLET | Freq: Two times a day (BID) | ORAL | Status: DC

## 2014-04-28 NOTE — Patient Instructions (Signed)
It was nice to see you!  I think you're right and this is gout in your foot and ankle. I sent in the colchicine.  Take 1 pill twice a day until the pain is better.  This will probably be 3-4 days.  If the pain is not better by next week, please come back.

## 2014-04-28 NOTE — Progress Notes (Signed)
Subjective:    Patient ID: [name], female    DOB: 03/06/1924, 80 y.o.   MRN: [id number]  HPI [name] is here for a same-day appointment for left foot and ankle pain.  She states that she has had pain in her legs since Thursday. Initially it was in both knees, ankles and feet. However, she states the right leg is back to normal, and her pain is now limited to the left foot. The left foot is swollen. Pain is mostly along the lateral aspect of the great toe MTP joint. It is worse with movement of the first MTP joint. Does also have some pain across the forefoot. She has a history of gout and was taken off of her daily colchicine sometime ago. She states she had some tuna fish the day before this started which she states is a trigger for her gout. She also states she has some diabetic peripheral neuropathy. Denies any trauma. She has been started on gabapentin for this. She has been taking her Norco for the pain, with minimal improvement.  Current Outpatient Prescriptions on File Prior to Visit  Medication Sig Dispense Refill  . albuterol (PROVENTIL HFA;VENTOLIN HFA) 108 (90 BASE) MCG/ACT inhaler Inhale 2 puffs into the lungs every 6 (six) hours as needed. For shortness of breath  1 Inhaler  6  . amLODipine (NORVASC) 10 MG tablet Take 1 tablet (10 mg total) by mouth daily.  30 tablet  3  . aspirin (ASPIRIN CHILDRENS) 81 MG chewable tablet Chew 81 mg by mouth daily.        . carvedilol (COREG) 12.5 MG tablet Take 12.5 mg by mouth 2 (two) times daily with a meal.      . furosemide (LASIX) 20 MG tablet Take 1 tablet (20 mg total) by mouth daily.  30 tablet  3  . gabapentin (NEURONTIN) 300 MG capsule Take 1 capsule (300 mg total) by mouth 2 (two) times daily.  60 capsule  0  . losartan (COZAAR) 50 MG tablet TAKE 1 TABLET (50 MG TOTAL) BY MOUTH DAILY.  30 tablet  4  . magnesium hydroxide (MILK OF MAGNESIA) 400 MG/5ML suspension Take 30 mLs by mouth daily as needed for mild constipation.        . metFORMIN (GLUCOPHAGE) 500 MG tablet TAKE 1 TABLET (500 MG TOTAL) BY MOUTH 2 (TWO) TIMES DAILY WITH A MEAL.  60 tablet  5  . mirtazapine (REMERON) 15 MG tablet Take 1 tablet (15 mg total) by mouth at bedtime.  30 tablet  3  . PATADAY 0.2 % SOLN Place 1 drop into both eyes daily as needed. For itchy eyes      . potassium chloride (K-DUR) 10 MEQ tablet Take 1 tablet (10 mEq total) by mouth daily. Take with the lasix  30 tablet  0  . ranitidine (ZANTAC) 150 MG capsule Take 1 capsule (150 mg total) by mouth 2 (two) times daily as needed for heartburn.  60 capsule  3  . rosuvastatin (CRESTOR) 20 MG tablet Take 20 mg by mouth daily.      . tamoxifen (NOLVADEX) 20 MG tablet Take 1 tablet (20 mg total) by mouth daily.  30 tablet  4  . [DISCONTINUED] cetirizine (ZYRTEC) 5 MG tablet Take 1 tablet (5 mg total) by mouth daily. Take at night time as it can cause some drowsiness  30 tablet  4   No current facility-administered medications on file prior to visit.    I  have reviewed and updated the following as appropriate: allergies and current medications SHx: non smoker   Review of Systems See HPI    Objective:   Physical Exam BP 158/70  Pulse 72  Temp(Src) 98.3 F (36.8 C) (Oral)  Wt 122 lb (55.339 kg) Gen: alert, cooperative, NAD Left foot: swelling to mid shin; tender to palpation along 1st MTP joint and medial malleolus; no erythema or warmth; pain with active and passive flexion of the great toe      Assessment & Plan:

## 2014-04-28 NOTE — Assessment & Plan Note (Signed)
History most consistent with a gout attack, however exam is not classic for this. We'll go ahead and treat with colchicine twice a day for the next 3-4 days. Followup if not improving in the next week.

## 2014-04-29 ENCOUNTER — Emergency Department (HOSPITAL_COMMUNITY)
Admission: EM | Admit: 2014-04-29 | Discharge: 2014-04-29 | Discharge status: Against Medical Advice | Payer: Medicare Other | Attending: Emergency Medicine | Admitting: Emergency Medicine

## 2014-04-29 ENCOUNTER — Other Ambulatory Visit: Payer: Self-pay

## 2014-04-29 ENCOUNTER — Emergency Department (HOSPITAL_COMMUNITY): Payer: Medicare Other

## 2014-04-29 DIAGNOSIS — R531 Weakness: Secondary | ICD-10-CM

## 2014-04-29 DIAGNOSIS — I509 Heart failure, unspecified: Secondary | ICD-10-CM | POA: Diagnosis not present

## 2014-04-29 DIAGNOSIS — K219 Gastro-esophageal reflux disease without esophagitis: Secondary | ICD-10-CM | POA: Insufficient documentation

## 2014-04-29 DIAGNOSIS — Z7982 Long term (current) use of aspirin: Secondary | ICD-10-CM | POA: Insufficient documentation

## 2014-04-29 DIAGNOSIS — E119 Type 2 diabetes mellitus without complications: Secondary | ICD-10-CM | POA: Diagnosis not present

## 2014-04-29 DIAGNOSIS — Z9885 Transplanted organ removal status: Secondary | ICD-10-CM | POA: Diagnosis not present

## 2014-04-29 DIAGNOSIS — Z853 Personal history of malignant neoplasm of breast: Secondary | ICD-10-CM | POA: Diagnosis not present

## 2014-04-29 DIAGNOSIS — Z8701 Personal history of pneumonia (recurrent): Secondary | ICD-10-CM | POA: Diagnosis not present

## 2014-04-29 DIAGNOSIS — Z8739 Personal history of other diseases of the musculoskeletal system and connective tissue: Secondary | ICD-10-CM | POA: Insufficient documentation

## 2014-04-29 DIAGNOSIS — R42 Dizziness and giddiness: Secondary | ICD-10-CM | POA: Insufficient documentation

## 2014-04-29 DIAGNOSIS — I2510 Atherosclerotic heart disease of native coronary artery without angina pectoris: Secondary | ICD-10-CM | POA: Diagnosis not present

## 2014-04-29 DIAGNOSIS — Z85038 Personal history of other malignant neoplasm of large intestine: Secondary | ICD-10-CM | POA: Diagnosis not present

## 2014-04-29 DIAGNOSIS — I252 Old myocardial infarction: Secondary | ICD-10-CM | POA: Insufficient documentation

## 2014-04-29 DIAGNOSIS — K5900 Constipation, unspecified: Secondary | ICD-10-CM | POA: Diagnosis not present

## 2014-04-29 DIAGNOSIS — J45901 Unspecified asthma with (acute) exacerbation: Secondary | ICD-10-CM | POA: Insufficient documentation

## 2014-04-29 DIAGNOSIS — Z79899 Other long term (current) drug therapy: Secondary | ICD-10-CM | POA: Diagnosis not present

## 2014-04-29 DIAGNOSIS — Z8669 Personal history of other diseases of the nervous system and sense organs: Secondary | ICD-10-CM | POA: Insufficient documentation

## 2014-04-29 DIAGNOSIS — Z8719 Personal history of other diseases of the digestive system: Secondary | ICD-10-CM | POA: Diagnosis not present

## 2014-04-29 DIAGNOSIS — I10 Essential (primary) hypertension: Secondary | ICD-10-CM | POA: Insufficient documentation

## 2014-04-29 DIAGNOSIS — E785 Hyperlipidemia, unspecified: Secondary | ICD-10-CM | POA: Diagnosis not present

## 2014-04-29 DIAGNOSIS — Z9889 Other specified postprocedural states: Secondary | ICD-10-CM | POA: Diagnosis not present

## 2014-04-29 DIAGNOSIS — R112 Nausea with vomiting, unspecified: Secondary | ICD-10-CM | POA: Diagnosis not present

## 2014-04-29 DIAGNOSIS — R0602 Shortness of breath: Secondary | ICD-10-CM | POA: Diagnosis not present

## 2014-04-29 DIAGNOSIS — R5381 Other malaise: Secondary | ICD-10-CM | POA: Diagnosis not present

## 2014-04-29 DIAGNOSIS — G40909 Epilepsy, unspecified, not intractable, without status epilepticus: Secondary | ICD-10-CM | POA: Diagnosis not present

## 2014-04-29 DIAGNOSIS — R5383 Other fatigue: Secondary | ICD-10-CM | POA: Diagnosis not present

## 2014-04-29 LAB — COMPREHENSIVE METABOLIC PANEL
ALBUMIN: 3.3 g/dL — AB (ref 3.5–5.2)
ALT: 6 U/L (ref 0–35)
AST: 12 U/L (ref 0–37)
Alkaline Phosphatase: 52 U/L (ref 39–117)
BUN: 18 mg/dL (ref 6–23)
CO2: 23 mEq/L (ref 19–32)
CREATININE: 0.94 mg/dL (ref 0.50–1.10)
Calcium: 10.9 mg/dL — ABNORMAL HIGH (ref 8.4–10.5)
Chloride: 101 mEq/L (ref 96–112)
GFR calc Af Amer: 61 mL/min — ABNORMAL LOW (ref >90)
GFR calc non Af Amer: 53 mL/min — ABNORMAL LOW (ref >90)
Glucose, Bld: 206 mg/dL — ABNORMAL HIGH (ref 70–99)
Potassium: 4.4 mEq/L (ref 3.7–5.3)
Sodium: 140 mEq/L (ref 137–147)
TOTAL PROTEIN: 7.8 g/dL (ref 6.0–8.3)
Total Bilirubin: <0.2 mg/dL — ABNORMAL LOW (ref 0.3–1.2)

## 2014-04-29 LAB — TROPONIN I

## 2014-04-29 LAB — CBC WITH DIFFERENTIAL/PLATELET
BASOS PCT: 1 % (ref 0–1)
Basophils Absolute: 0 10*3/uL (ref 0.0–0.1)
EOS ABS: 0.1 10*3/uL (ref 0.0–0.7)
Eosinophils Relative: 4 % (ref 0–5)
HEMATOCRIT: 31.8 % — AB (ref 36.0–46.0)
HEMOGLOBIN: 10 g/dL — AB (ref 12.0–15.0)
Lymphocytes Relative: 30 % (ref 12–46)
Lymphs Abs: 1.2 10*3/uL (ref 0.7–4.0)
MCH: 22.4 pg — ABNORMAL LOW (ref 26.0–34.0)
MCHC: 31.4 g/dL (ref 30.0–36.0)
MCV: 71.1 fL — ABNORMAL LOW (ref 78.0–100.0)
MONO ABS: 0.3 10*3/uL (ref 0.1–1.0)
MONOS PCT: 7 % (ref 3–12)
Neutro Abs: 2.3 10*3/uL (ref 1.7–7.7)
Neutrophils Relative %: 59 % (ref 43–77)
Platelets: 189 10*3/uL (ref 150–400)
RBC: 4.47 MIL/uL (ref 3.87–5.11)
RDW: 15.5 % (ref 11.5–15.5)
WBC: 4 10*3/uL (ref 4.0–10.5)

## 2014-04-29 LAB — URINALYSIS, ROUTINE W REFLEX MICROSCOPIC
BILIRUBIN URINE: NEGATIVE
Glucose, UA: 100 mg/dL — AB
Hgb urine dipstick: NEGATIVE
Ketones, ur: NEGATIVE mg/dL
Leukocytes, UA: NEGATIVE
Nitrite: NEGATIVE
Protein, ur: NEGATIVE mg/dL
SPECIFIC GRAVITY, URINE: 1.012 (ref 1.005–1.030)
UROBILINOGEN UA: 0.2 mg/dL (ref 0.0–1.0)
pH: 8 (ref 5.0–8.0)

## 2014-04-29 LAB — PRO B NATRIURETIC PEPTIDE: PRO B NATRI PEPTIDE: 372.7 pg/mL (ref 0–450)

## 2014-04-29 LAB — LIPASE, BLOOD: Lipase: 62 U/L — ABNORMAL HIGH (ref 11–59)

## 2014-04-29 NOTE — ED Notes (Signed)
Family at bedside. 

## 2014-04-29 NOTE — ED Notes (Signed)
Patient does not want another set of vitals done. She states "I am ready to go!"

## 2014-04-29 NOTE — ED Notes (Signed)
Pt refusing IV at this time; PA at bedside to talk to her.

## 2014-04-29 NOTE — ED Notes (Addendum)
EMS reports that pt did not want to come and just saw MD yesterday. Was instructed not to get out of bed due to neuropathy without assistance. EMS reports son was intoxicated and did not want to help her so called them. Respirations even and unlabored. Has not taken meds this morning due to feeling nauseated. A&Ox3. CBG 175. BP 160/80 Pt given med yesterday for gout. Home health aide coming at 0900.

## 2014-04-29 NOTE — ED Notes (Signed)
Pt reports she felt nauseated when woke up and has not taken meds.

## 2014-04-29 NOTE — ED Notes (Signed)
Pt to xray

## 2014-04-29 NOTE — ED Provider Notes (Signed)
CSN: [id number]     Arrival date & time 04/29/14  0900 History   First MD Initiated Contact with Patient 04/29/14 [id number]     Chief Complaint  Patient presents with  . Shortness of Breath     (Consider location/radiation/quality/duration/timing/severity/associated sxs/prior Treatment) The history is provided by the patient. No language interpreter was used.  [name] is an 80 y/o F with PMHx of diabetes, hyperlipidemia, hypertension, anemia, congestive heart failure, CAD, neuropathy, GERD, diverticulosis, arthritis, GI bleed, DDD, colon cancer, breast cancer on tamoxifen presenting to the ED with alleged shortness of breath. As per patient, denied shortness of breath-stated that she does not know why she is here that her son when her to come to the ED to be checked out. Stated she was just seen and assessed by her primary care Nonna Renninger yesterday and all is well. Reported that she has been having lightheadedness-as per son, reported that he believes this lightheadedness is due to the tuna fish that she yesterday. As per patient, reported that when she got up out of bed to get her daughter at the front door reported that she had an episode of lightheadedness with nausea - stated that if she did not sit down she would fall. Stated that she's been having ongoing intermittent lightheadedness for the past couple of days, reported that the nausea intensified this morning. Stated that she's been spitting up yesterday at approximately 2 episodes-reported she has not had any food or fluids today secondary to continuous spitting up and feeling of nausea. Denied fall, head injury, LOC, fever, chest pain, shortness of breath, difficulty breathing, numbness, tingling, blurred vision, sudden loss of vision, abdominal pain, neck pain, back pain. PCP Dr. Otis Dials  Past Medical History  Diagnosis Date  . Breast cancer 10/2010    Invasive Ductal Carcinoma, s/p bilateral mastectomy, now on Tamoxifen. Followed by Dr  Jamse Arn.   . Diabetes mellitus   . Hypertension   . Hyperlipidemia   . Anemia     Iron def anemia  . CAD (coronary artery disease)   . CHF (congestive heart failure)     Systolic   . History of colon cancer   . Neuropathy   . Allergy   . GERD (gastroesophageal reflux disease)   . Hiatal hernia   . Diverticulosis   . Asthma   . Arthritis   . H/O: GI bleed   . Breast cancer 2011    s/p Bilateral masectomy  . Shortness of breath   . Pneumonia 03/2012  . Carotid artery aneurysm 08/2010    right ICA, 5 x 77mm  . DDD (degenerative disc disease), cervical   . Colon cancer   . Colon cancer 12/11/2013  . Myocardial infarction    Past Surgical History  Procedure Laterality Date  . Breast masectomy  10/2010    Bilateral masectomy by Dr Lucia Gaskins s/p invasive ductal carcinoma.  . Cardiac  catherization  2007    Severe 3-vessel disease.  EF 20-25%.  . Esophagogastroduodenoscopy  2001    Esophageal Tear  . Total knee arthroplasty  2001  . Esophagogastroduodenoscopy  10/27/2012    Procedure: ESOPHAGOGASTRODUODENOSCOPY (EGD);  Surgeon: Irene Shipper, MD;  Location[facility] ENDOSCOPY;  Service: Endoscopy;  Laterality: N/A;  pat  . Breast surgery     Family History  Problem Relation Age of Onset  . Sickle cell anemia Other   . Heart disease Mother   . Heart disease Father   . Cancer Daughter 31  breast ca  . Cancer Daughter 58    breast ca   History  Substance Use Topics  . Smoking status: Never Smoker   . Smokeless tobacco: Current User    Types: Snuff  . Alcohol Use: No   OB History   Grav Para Term Preterm Abortions TAB SAB Ect Mult Living                 Review of Systems  Constitutional: Negative for fever and chills.  Respiratory: Negative for chest tightness and shortness of breath.   Cardiovascular: Negative for chest pain.  Gastrointestinal: Positive for nausea, vomiting and constipation (chronic). Negative for abdominal pain, diarrhea, blood in stool and anal  bleeding.  Musculoskeletal: Negative for back pain, neck pain and neck stiffness.  Neurological: Positive for light-headedness. Negative for dizziness and weakness.      Allergies  Peanuts and Lisinopril  Home Medications   Prior to Admission medications   Medication Sig Start Date End Date Taking? Authorizing Amora Sheehy  albuterol (PROVENTIL HFA;VENTOLIN HFA) 108 (90 BASE) MCG/ACT inhaler Inhale 2 puffs into the lungs every 6 (six) hours as needed. For shortness of breath 01/31/13  Yes Kandis Nab, MD  amLODipine (NORVASC) 10 MG tablet Take 1 tablet (10 mg total) by mouth daily. 03/25/14  Yes Melony Overly, MD  aspirin (ASPIRIN CHILDRENS) 81 MG chewable tablet Chew 81 mg by mouth daily.     Yes Historical [name], MD  colchicine 0.6 MG tablet Take 1 tablet (0.6 mg total) by mouth 2 (two) times daily. 04/28/14  Yes Melony Overly, MD  furosemide (LASIX) 20 MG tablet Take 1 tablet (20 mg total) by mouth daily. 03/25/14  Yes Melony Overly, MD  gabapentin (NEURONTIN) 300 MG capsule Take 1 capsule (300 mg total) by mouth 2 (two) times daily. 01/25/14  Yes Veryl Speak, MD  HYDROcodone-acetaminophen (NORCO/VICODIN) 5-325 MG per tablet Take 1 tablet by mouth every 6 (six) hours as needed for moderate pain.   Yes Historical Leylany Nored, MD  losartan (COZAAR) 50 MG tablet Take 50 mg by mouth daily.   Yes Historical Ronda Rajkumar, MD  magnesium hydroxide (MILK OF MAGNESIA) 400 MG/5ML suspension Take 30 mLs by mouth daily as needed for mild constipation.   Yes Historical Demontre Padin, MD  metFORMIN (GLUCOPHAGE) 500 MG tablet Take 500 mg by mouth 2 (two) times daily with a meal.   Yes Historical Tamula Morrical, MD  mirtazapine (REMERON) 15 MG tablet Take 15 mg by mouth at bedtime as needed (sleep).   Yes Historical Shanekia Latella, MD  PATADAY 0.2 % SOLN Place 1 drop into both eyes daily as needed. For itchy eyes 03/05/11  Yes Historical Jaykub Mackins, MD  potassium chloride (K-DUR) 10 MEQ tablet Take 1 tablet (10 mEq total) by mouth  daily. Take with the lasix 01/14/14  Yes Kandis Nab, MD  ranitidine (ZANTAC) 150 MG capsule Take 1 capsule (150 mg total) by mouth 2 (two) times daily as needed for heartburn. 03/25/14  Yes Melony Overly, MD  rosuvastatin (CRESTOR) 20 MG tablet Take 20 mg by mouth daily.   Yes Historical Kadian Barcellos, MD  tamoxifen (NOLVADEX) 20 MG tablet Take 1 tablet (20 mg total) by mouth daily. 03/03/14  Yes Kandis Nab, MD   BP 153/52  Pulse 70  Temp(Src) 98 F (36.7 C) (Oral)  Resp 23  SpO2 100% Physical Exam  Nursing note and vitals reviewed. Constitutional: She is oriented to person, place, and time. She appears well-developed and well-nourished. No  distress.  Patient sitting comfortably in bed with no signs of respiratory distress  HENT:  Head: Normocephalic and atraumatic.  Mouth/Throat: Oropharynx is clear and moist. No oropharyngeal exudate.  Eyes: Conjunctivae and EOM are normal. Pupils are equal, round, and reactive to light. Right eye exhibits no discharge. Left eye exhibits no discharge.  Neck: Normal range of motion. Neck supple. No tracheal deviation present.  Cardiovascular: Normal rate, regular rhythm and normal heart sounds.  Exam reveals no friction rub.   No murmur heard. Cap refill less than 3 seconds Swelling identified to lower extremities bilaterally, negative pitting edema noted bilaterally  Pulmonary/Chest: Effort normal and breath sounds normal. No respiratory distress. She has no wheezes. She has no rales.  Patient is able to speak in full sentences without difficulty Negative use of accessory muscles Negative stridor  Abdominal: Soft. Bowel sounds are normal. She exhibits no distension. There is no tenderness. There is no rebound and no guarding.  Negative abdominal distention Bowel sounds normal active in all 4 quadrants Abdomen soft upon palpation  Musculoskeletal: Normal range of motion.  Full ROM to upper and lower extremities without difficulty noted, negative  ataxia noted.  Lymphadenopathy:    She has no cervical adenopathy.  Neurological: She is alert and oriented to person, place, and time. No cranial nerve deficit. She exhibits normal muscle tone. Coordination normal.  Cranial nerves III-XII grossly intact Strength 5+/5+ to upper and lower extremities bilaterally with resistance applied, equal distribution noted Equal grip strength Negative facial droop Negative slurred speech Negative aphasia Gait proper, proper balance - negative sway, negative drift, negative step-offs GCS 15  Skin: Skin is warm and dry. No rash noted. She is not diaphoretic. No erythema.  Psychiatric: She has a normal mood and affect. Her behavior is normal. Thought content normal.    ED Course  Procedures (including critical care time)  10:29 AM Patient refusing IV insertion, reported that she does not want it. Patient reported that she is fine. Agreed to blood work.   1:58 PM This Dayle Sherpa spoke with the patient who continues to refuse further imaging, testing, IV. Patient currently dipping tobacco while in the ED setting.   Results for orders placed during the hospital encounter of 04/29/14  CBC WITH DIFFERENTIAL      Result Value Ref Range   WBC 4.0  4.0 - 10.5 K/uL   RBC 4.47  3.87 - 5.11 MIL/uL   Hemoglobin 10.0 (*) 12.0 - 15.0 g/dL   HCT 31.8 (*) 36.0 - 46.0 %   MCV 71.1 (*) 78.0 - 100.0 fL   MCH 22.4 (*) 26.0 - 34.0 pg   MCHC 31.4  30.0 - 36.0 g/dL   RDW 15.5  11.5 - 15.5 %   Platelets 189  150 - 400 K/uL   Neutrophils Relative % 59  43 - 77 %   Neutro Abs 2.3  1.7 - 7.7 K/uL   Lymphocytes Relative 30  12 - 46 %   Lymphs Abs 1.2  0.7 - 4.0 K/uL   Monocytes Relative 7  3 - 12 %   Monocytes Absolute 0.3  0.1 - 1.0 K/uL   Eosinophils Relative 4  0 - 5 %   Eosinophils Absolute 0.1  0.0 - 0.7 K/uL   Basophils Relative 1  0 - 1 %   Basophils Absolute 0.0  0.0 - 0.1 K/uL  COMPREHENSIVE METABOLIC PANEL      Result Value Ref Range   Sodium 140  137 -  147 mEq/L   Potassium 4.4  3.7 - 5.3 mEq/L   Chloride 101  96 - 112 mEq/L   CO2 23  19 - 32 mEq/L   Glucose, Bld 206 (*) 70 - 99 mg/dL   BUN 18  6 - 23 mg/dL   Creatinine, Ser 0.94  0.50 - 1.10 mg/dL   Calcium 10.9 (*) 8.4 - 10.5 mg/dL   Total Protein 7.8  6.0 - 8.3 g/dL   Albumin 3.3 (*) 3.5 - 5.2 g/dL   AST 12  0 - 37 U/L   ALT 6  0 - 35 U/L   Alkaline Phosphatase 52  39 - 117 U/L   Total Bilirubin <0.2 (*) 0.3 - 1.2 mg/dL   GFR calc non Af Amer 53 (*) >90 mL/min   GFR calc Af Amer 61 (*) >90 mL/min  TROPONIN I      Result Value Ref Range   Troponin I <0.30  <0.30 ng/mL  LIPASE, BLOOD      Result Value Ref Range   Lipase 62 (*) 11 - 59 U/L  URINALYSIS, ROUTINE W REFLEX MICROSCOPIC      Result Value Ref Range   Color, Urine YELLOW  YELLOW   APPearance CLEAR  CLEAR   Specific Gravity, Urine 1.012  1.005 - 1.030   pH 8.0  5.0 - 8.0   Glucose, UA 100 (*) NEGATIVE mg/dL   Hgb urine dipstick NEGATIVE  NEGATIVE   Bilirubin Urine NEGATIVE  NEGATIVE   Ketones, ur NEGATIVE  NEGATIVE mg/dL   Protein, ur NEGATIVE  NEGATIVE mg/dL   Urobilinogen, UA 0.2  0.0 - 1.0 mg/dL   Nitrite NEGATIVE  NEGATIVE   Leukocytes, UA NEGATIVE  NEGATIVE  PRO B NATRIURETIC PEPTIDE      Result Value Ref Range   Pro B Natriuretic peptide (BNP) 372.7  0 - 450 pg/mL    Labs Review Labs Reviewed  CBC WITH DIFFERENTIAL - Abnormal; Notable for the following:    Hemoglobin 10.0 (*)    HCT 31.8 (*)    MCV 71.1 (*)    MCH 22.4 (*)    All other components within normal limits  COMPREHENSIVE METABOLIC PANEL - Abnormal; Notable for the following:    Glucose, Bld 206 (*)    Calcium 10.9 (*)    Albumin 3.3 (*)    Total Bilirubin <0.2 (*)    GFR calc non Af Amer 53 (*)    GFR calc Af Amer 61 (*)    All other components within normal limits  LIPASE, BLOOD - Abnormal; Notable for the following:    Lipase 62 (*)    All other components within normal limits  URINALYSIS, ROUTINE W REFLEX MICROSCOPIC -  Abnormal; Notable for the following:    Glucose, UA 100 (*)    All other components within normal limits  TROPONIN I  PRO B NATRIURETIC PEPTIDE    Imaging Review Dg Chest 2 View  04/29/2014   CLINICAL DATA:  Shortness of breath.  EXAM: CHEST  2 VIEW  COMPARISON:  None.  FINDINGS: Mediastinum and hilar structures normal. Subsegmental atelectasis and/or scarring lung bases noted. No pleural effusion or pneumothorax. No acute bony abnormality. Mild cardiomegaly, no CHF . Degenerative changes thoracic spine.  IMPRESSION: 1. Bibasilar subsegmental atelectasis and/or scarring. No acute cardiopulmonary disease noted.  2. Mild cardiomegaly, no CHF.   Electronically Signed   By: Marcello Moores  Register   On: 04/29/2014 11:11     EKG Interpretation   Date/Time:  Wednesday April 29 2014 10:34:35 EDT Ventricular Rate:  66 PR Interval:  176 QRS Duration: 69 QT Interval:  452 QTC Calculation: 474 R Axis:   19 Text Interpretation:  Sinus rhythm Possible L atrial abnormality No  significant change was found Confirmed by DOCHERTY  MD, MEGAN ([id number]) on  04/29/2014 12:55:34 PM      MDM   Final diagnoses:  Lightheadedness  Weakness    Filed Vitals:   04/29/14 1245 04/29/14 1248 04/29/14 1327 04/29/14 1436  BP: 124/57 143/58 148/55 153/52  Pulse: 66   70  Temp:      TempSrc:      Resp: 14 15 23    SpO2: 99% 100% 100% 100%   EKG noted normal sinus rhythm with possible left atrial abnormalities with a heart rate of 66 beats per minute-no significant changes identified from previous tracings.. Troponin negative elevation. CBC negative elevated white blood cell count-negative left shift or leukocytosis noted. Hemoglobin 10.0, hematocrit 31.8-when compared to previous labs patient's been as low as 8.9-average hemoglobin appears to be 10.3. CMP noted elevated calcium at 10.9 - 1 previous labs reviewed patient's calcium level appears to be high - highest it has been is 11.1. Liver enzymes negative elevation,  negative elevated alkaline phosphatase and bilirubin. Lipase moderately elevated at 62. Glucose 206, anion gap of 16.0 mEq per liter. BNP negative elevation noted. Urinalysis negative for infection-negative hemoglobin, nitrites, leukocytes. Chest x-ray noted bibasally or subsegmental atelectasis and or scarring-no acute cortical hemorrhage disease noted. Mild cardiomegaly with no CHF identified. Doubt DKA. Patient refuses IV, further testing and imaging. This Humza Tallerico wanted to get further imaging on patient and admission, but patient refused - this Tage Feggins wanted to admit the patient regarding pre-syncopal episode and significant history. Patient reported that she is fine and that she would like to leave. Reported that she was seen by her PCP yesterday and stated that the physician reported that everything was fine - stated that her son wanted to bring her here because of the fact that he did not want to deal with her. Patient medically able to make her own decisions. Patient refusing admission and further workup. This Keldon Lassen discussed case with attending physician, patient to sign out AMA. Patient currently dipping tobacco in the ED setting. Discussed with patient the dangers and possible fatality - patient understood and continued to refuse admission and further testing. Patient signed AMA. Discussed with patient to closely monitor symptoms and if symptoms are to worsen or change to report back to the ED healing. Patient agreed and understood plan of care.  Jamse Mead, PA-C 04/30/14 [phone]

## 2014-04-29 NOTE — ED Notes (Signed)
Pt ambulated to restroom without distress with RN assistance. Baseline walks with walker.

## 2014-04-29 NOTE — Discharge Instructions (Signed)
Please call your doctor for a followup appointment within 24-48 hours. When you talk to your doctor please let them know that you were seen in the emergency department and have them acquire all of your records so that they can discuss the findings with you and formulate a treatment plan to fully care for your new and ongoing problems. Please call and set-up an appointment with your primary care provider to be seen and re-assessed  Please rest and stay hydrated Please avoid any physical or strenuous activity Please continue to monitor symptoms closely and if symptoms are to worsen or change (fever greater than 101, chills, chest pain, short of breath, difficulty breathing, numbness, tingling, worsening or changes to pain pattern, swelling to the legs, stomach pain, nausea, vomiting, diarrhea, blood in the stools, black tarry stools, feeling as if you're going to faint, fainting episodes, dizziness, blurred vision, sudden loss of vision) please report back to the ED immediately  Weakness Weakness is a lack of strength. It may be felt all over the body (generalized) or in one specific part of the body (focal). Some causes of weakness can be serious. You may need further medical evaluation, especially if you are elderly or you have a history of immunosuppression (such as chemotherapy or HIV), kidney disease, heart disease, or diabetes. CAUSES  Weakness can be caused by many different things, including:  Infection.  Physical exhaustion.  Internal bleeding or other blood loss that results in a lack of red blood cells (anemia).  Dehydration. This cause is more common in elderly people.  Side effects or electrolyte abnormalities from medicines, such as pain medicines or sedatives.  Emotional distress, anxiety, or depression.  Circulation problems, especially severe peripheral arterial disease.  Heart disease, such as rapid atrial fibrillation, bradycardia, or heart failure.  Nervous system  disorders, such as Guillain-Barr syndrome, multiple sclerosis, or stroke. DIAGNOSIS  To find the cause of your weakness, your caregiver will take your history and perform a physical exam. Lab tests or X-rays may also be ordered, if needed. TREATMENT  Treatment of weakness depends on the cause of your symptoms and can vary greatly. HOME CARE INSTRUCTIONS   Rest as needed.  Eat a well-balanced diet.  Try to get some exercise every day.  Only take over-the-counter or prescription medicines as directed by your caregiver. SEEK MEDICAL CARE IF:   Your weakness seems to be getting worse or spreads to other parts of your body.  You develop new aches or pains. SEEK IMMEDIATE MEDICAL CARE IF:   You cannot perform your normal daily activities, such as getting dressed and feeding yourself.  You cannot walk up and down stairs, or you feel exhausted when you do so.  You have shortness of breath or chest pain.  You have difficulty moving parts of your body.  You have weakness in only one area of the body or on only one side of the body.  You have a fever.  You have trouble speaking or swallowing.  You cannot control your bladder or bowel movements.  You have black or bloody vomit or stools. MAKE SURE YOU:  Understand these instructions.  Will watch your condition.  Will get help right away if you are not doing well or get worse. Document Released: 10/30/2005 Document Revised: 04/30/2012 Document Reviewed: 12/29/2011 [facility] Patient Information 2015 [place], [place]. This information is not intended to replace advice given to you by your health care provider. Make sure you discuss any questions you have with your health  care provider.  

## 2014-04-30 ENCOUNTER — Telehealth: Payer: Self-pay | Admitting: Family Medicine

## 2014-04-30 NOTE — Telephone Encounter (Signed)
Ms. [name] called to say the injection she received the other day has helped her tremendously.  She said she may have to get another one day.

## 2014-04-30 NOTE — Telephone Encounter (Signed)
Reviewing her chart, I am not sure what kind of injection she is referring to, but she can discuss this with her new PCP at her next visit.   Liam Graham, PGY-3 Family Medicine Resident

## 2014-05-01 NOTE — ED Provider Notes (Signed)
Medical screening examination/treatment/procedure(s) were conducted as a shared visit with non-physician practitioner(s) and myself.  I personally evaluated the patient during the encounter.   EKG Interpretation   Date/Time:  Wednesday April 29 2014 10:34:35 EDT Ventricular Rate:  66 PR Interval:  176 QRS Duration: 69 QT Interval:  452 QTC Calculation: 474 R Axis:   19 Text Interpretation:  Sinus rhythm Possible L atrial abnormality No  significant change was found Confirmed by DOCHERTY  MD, MEGAN ([id number]) on  04/29/2014 12:55:34 PM      Pt presents w/ report of SOB/lightheadedness, though on my exam has no complaints & wants to leave. She does not want IV, or testing to be done. She will not stay in the hospital.  I believe she has capacity to make this decision. She is well appearing on PE, in NAD. Cardiopulm exam benign. Return precautions given for new or worsening symptoms including SOB, CP, fever.    Neta Ehlers, MD 05/01/14 [phone]

## 2014-05-14 ENCOUNTER — Ambulatory Visit: Payer: Medicare Other | Admitting: Family Medicine

## 2014-05-20 ENCOUNTER — Ambulatory Visit (INDEPENDENT_AMBULATORY_CARE_PROVIDER_SITE_OTHER): Payer: Medicare Other | Admitting: Family Medicine

## 2014-05-20 VITALS — BP 138/75 | HR 77 | Temp 97.7°F | Wt 121.0 lb

## 2014-05-20 DIAGNOSIS — M25559 Pain in unspecified hip: Secondary | ICD-10-CM | POA: Diagnosis not present

## 2014-05-20 DIAGNOSIS — R269 Unspecified abnormalities of gait and mobility: Secondary | ICD-10-CM | POA: Diagnosis not present

## 2014-05-20 DIAGNOSIS — Z9181 History of falling: Secondary | ICD-10-CM | POA: Diagnosis not present

## 2014-05-20 DIAGNOSIS — M79609 Pain in unspecified limb: Secondary | ICD-10-CM | POA: Diagnosis not present

## 2014-05-20 DIAGNOSIS — E119 Type 2 diabetes mellitus without complications: Secondary | ICD-10-CM | POA: Diagnosis not present

## 2014-05-20 DIAGNOSIS — F17200 Nicotine dependence, unspecified, uncomplicated: Secondary | ICD-10-CM | POA: Diagnosis not present

## 2014-05-20 DIAGNOSIS — J988 Other specified respiratory disorders: Secondary | ICD-10-CM | POA: Diagnosis not present

## 2014-05-20 DIAGNOSIS — I359 Nonrheumatic aortic valve disorder, unspecified: Secondary | ICD-10-CM | POA: Diagnosis not present

## 2014-05-20 DIAGNOSIS — R5381 Other malaise: Secondary | ICD-10-CM | POA: Diagnosis not present

## 2014-05-20 DIAGNOSIS — IMO0001 Reserved for inherently not codable concepts without codable children: Secondary | ICD-10-CM | POA: Diagnosis not present

## 2014-05-20 DIAGNOSIS — W19XXXA Unspecified fall, initial encounter: Secondary | ICD-10-CM

## 2014-05-20 DIAGNOSIS — R5383 Other fatigue: Secondary | ICD-10-CM | POA: Diagnosis not present

## 2014-05-20 NOTE — Patient Instructions (Signed)
It was nice to meet you today!  Use over the counter saline spray for your nose, instead of benadryl.  Schedule a visit with your new primary doctor within the next couple of weeks. Return to our clinic sooner if you have any more falls, dizziness, heart flutters, etc.  Be well, Dr. Ardelia Mems

## 2014-05-20 NOTE — Progress Notes (Signed)
Patient ID: [name], female   DOB: Jan 27, 1924, 80 y.o.   MRN: [id number]  HPI:  Pt presents today for follow up after a fall at home 2 weeks ago. She was coming out of her bathroom and fell. The fall was witnessed by her son. Her daughter accompanies her today. EMS was called and reportedly checked her blood pressure, which had a high systolic value (exact value unknown). She did not hit her head but did lose consciousness for about one minute. She did not injure herself, had fallen on her L side and was sore but no lasting injuries. No preceding or recent chest pain, dyspnea, palpitations. Has chronic stable lower extremity edema which is not worse lately. Prior to the fall "things went dark". She was using her cane instead of her walker at that time, and is supposed to be using a walker all the time. Feels fine now. No lightheadedness when she stands up. Has cardiologist but hasn't seen in the last 3 years. No lasting problems from the fall, is able to walk well now with her walker.  Also notes some recent facial congestion, has taken benadryl which helps. No fevers.  ROS: See HPI  [place]: T2DM, HLD, iron def anemia, HTN, CAD, CHF, asthma, GERD, breast cancer, colon cancer, PAD  PHYSICAL EXAM: BP 138/75  Pulse 77  Temp(Src) 97.7 F (36.5 C) (Oral)  Wt 121 lb (54.885 kg) Gen: NAD, pleasant, cooperative HEENT: NCAT, TM's not fully visualized due to cerumen, facial sinuses nontender to palpation Heart: RRR, no murmurs auscultated Lungs: CTAB, NWOB Neuro: grossly nonfocal, PERRL, EOMI, full strength in upper extremities, ambulates easily with walker, speech normal  ASSESSMENT/PLAN:  1. ER f/u from fall: unclear etiology of the fall. Concern for possible syncopal episode given reported LOC x at least 1 minute. Possibly vasovagal as she had just used the bathroom. She has no lasting deficits from the fall, no concern for subdural bleed. Echo in March 2015 noted without evidence of aortic  stenosis so doubt structural heart disease contributing. Given that she is presently well appearing, is 4, and has an extensive past medical history, will not do any additional testing at this time. Suspect any further evaluation could cause more harm than good. Precepted with Dr. Nori Riis who agrees with this plan. Pt has been instructed to f/u with her new PCP Dr. Lorenso Courier in the next couple of weeks.   2. Facial congestion - suspect viral or allergic, no signs of sinusitis at this time. Given age and above described comorbidities, advised against benadryl due to anticholinergic properties. Recommend nasal saline spray. Pt agreeable.  FOLLOW UP: F/u in a few weeks to meet new PCP  [place] J. Ardelia Mems, [place]

## 2014-05-25 ENCOUNTER — Ambulatory Visit (INDEPENDENT_AMBULATORY_CARE_PROVIDER_SITE_OTHER): Payer: Medicare Other | Admitting: Podiatry

## 2014-05-25 ENCOUNTER — Encounter: Payer: Self-pay | Admitting: Podiatry

## 2014-05-25 VITALS — BP 183/62 | HR 79 | Resp 17 | Ht 59.0 in | Wt 122.0 lb

## 2014-05-25 DIAGNOSIS — M79673 Pain in unspecified foot: Secondary | ICD-10-CM

## 2014-05-25 DIAGNOSIS — B351 Tinea unguium: Secondary | ICD-10-CM | POA: Diagnosis not present

## 2014-05-25 DIAGNOSIS — M79609 Pain in unspecified limb: Secondary | ICD-10-CM

## 2014-05-26 NOTE — Progress Notes (Signed)
Patient ID: [name], female   DOB: December 19, 1923, 80 y.o.   MRN: [id number]  Subjective: Orientated x3 black female  Objective: Incurvated, elongated, discolored, toenails 6-10  Assessment: Symptomatic onychomycoses 6-10  Plan: Debridement toenails x10 without a bleeding  Reappoint x3 months

## 2014-05-27 ENCOUNTER — Encounter: Payer: Self-pay | Admitting: *Deleted

## 2014-05-30 IMAGING — CR DG THORACIC SPINE 3V
4 series · 4 of 4 positions shown · non-contrast
Comparison: Chest x-ray dated 01/25/2014 and chest CT dated
01/21/2013

CLINICAL DATA: Mid thoracic pain secondary to a fall today.

EXAM:
THORACIC SPINE - 2 VIEW + SWIMMERS

[t t-spine a.p.]
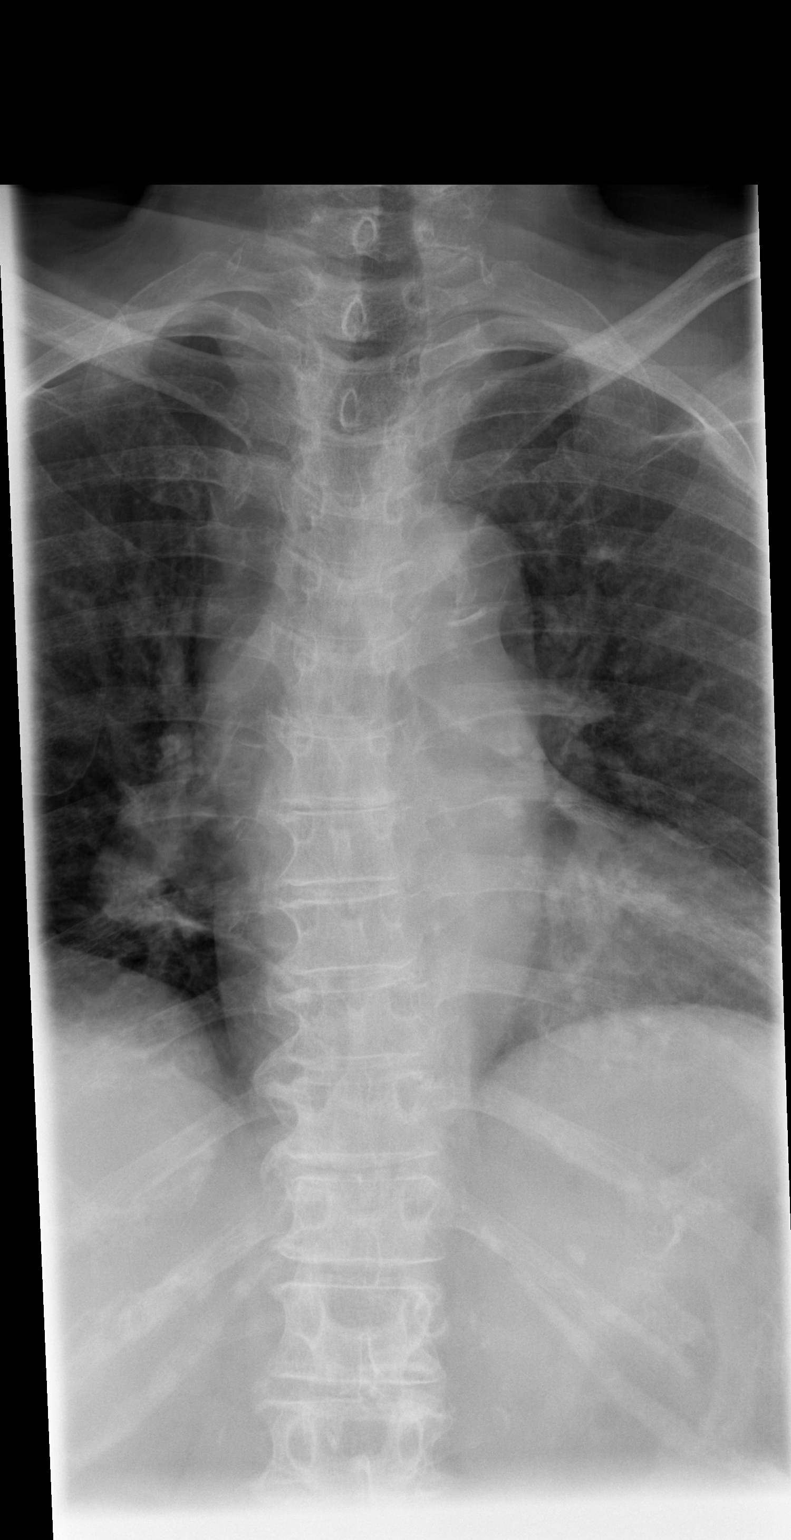

[t t-spine lat (1 of 2)]
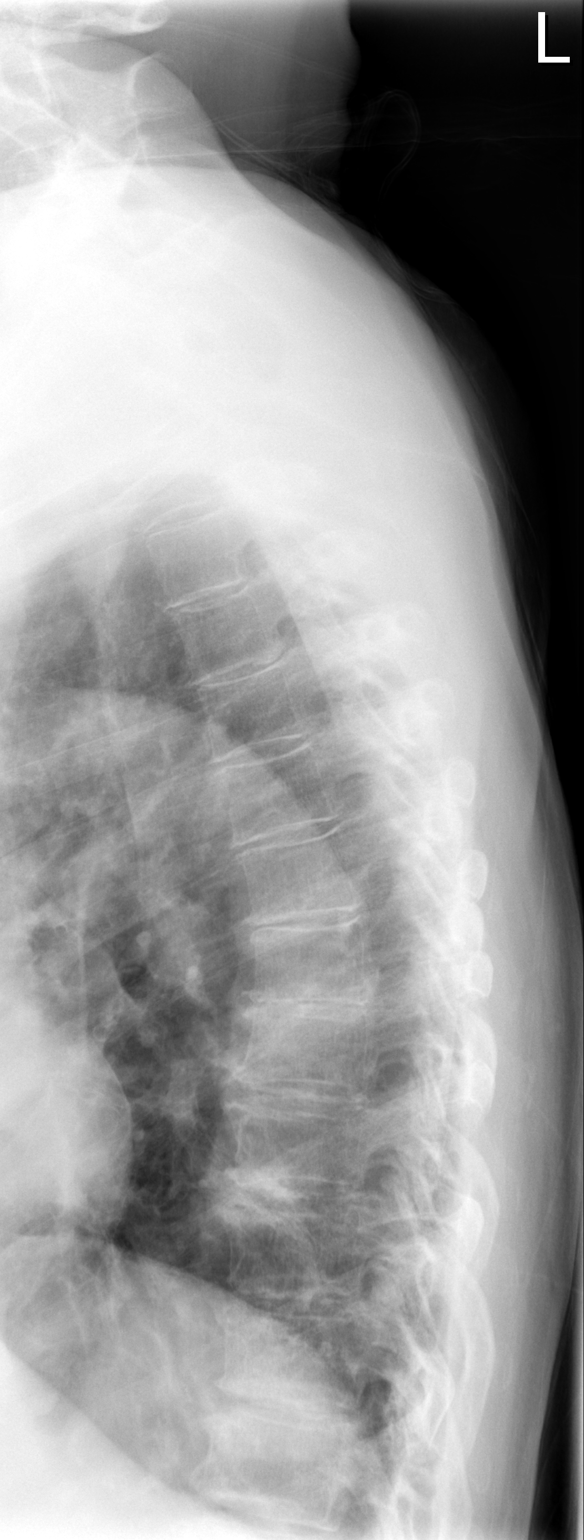

[t t-spine lat (2 of 2)]
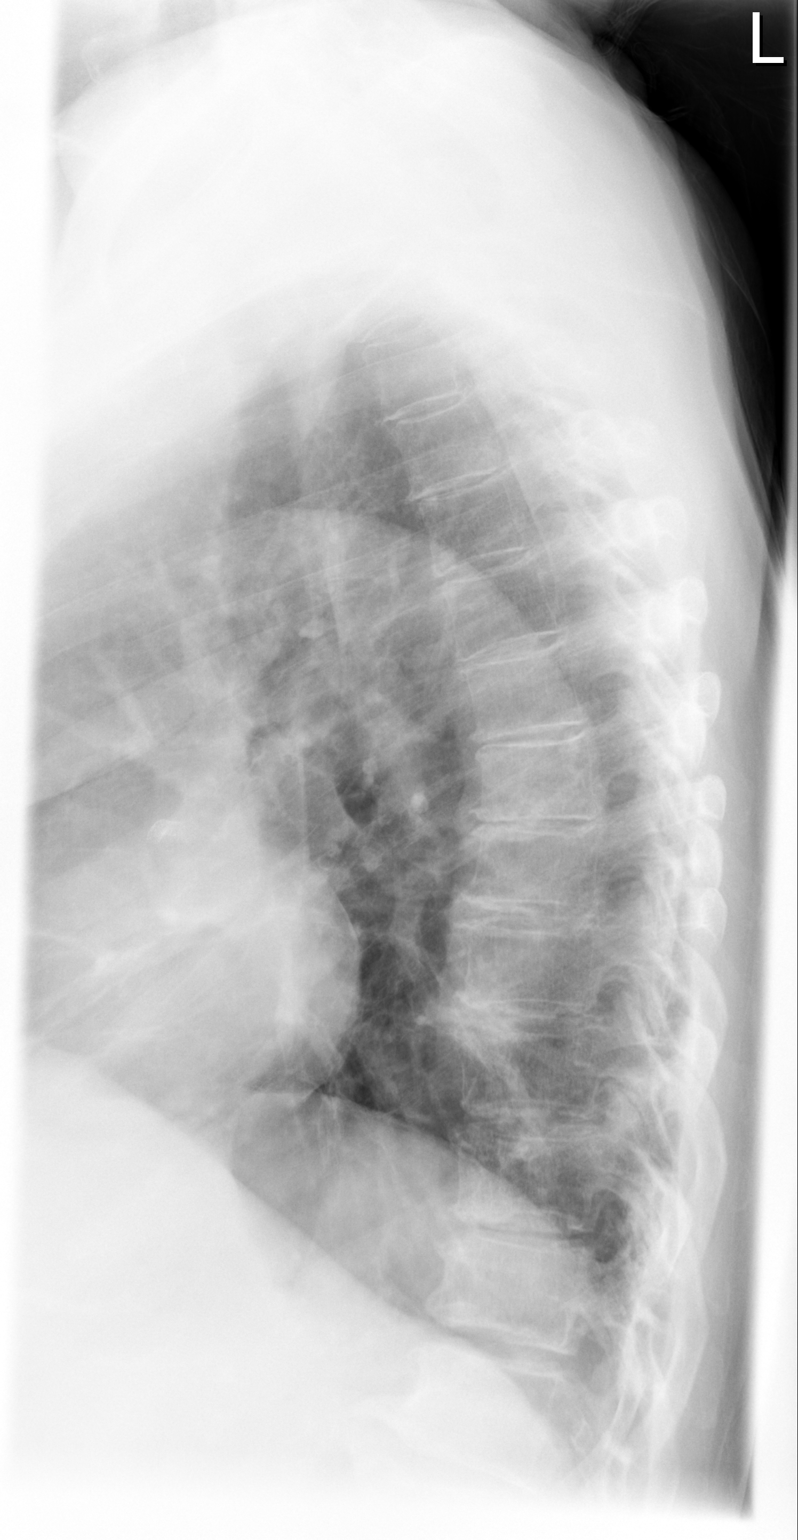

[t swimmers]
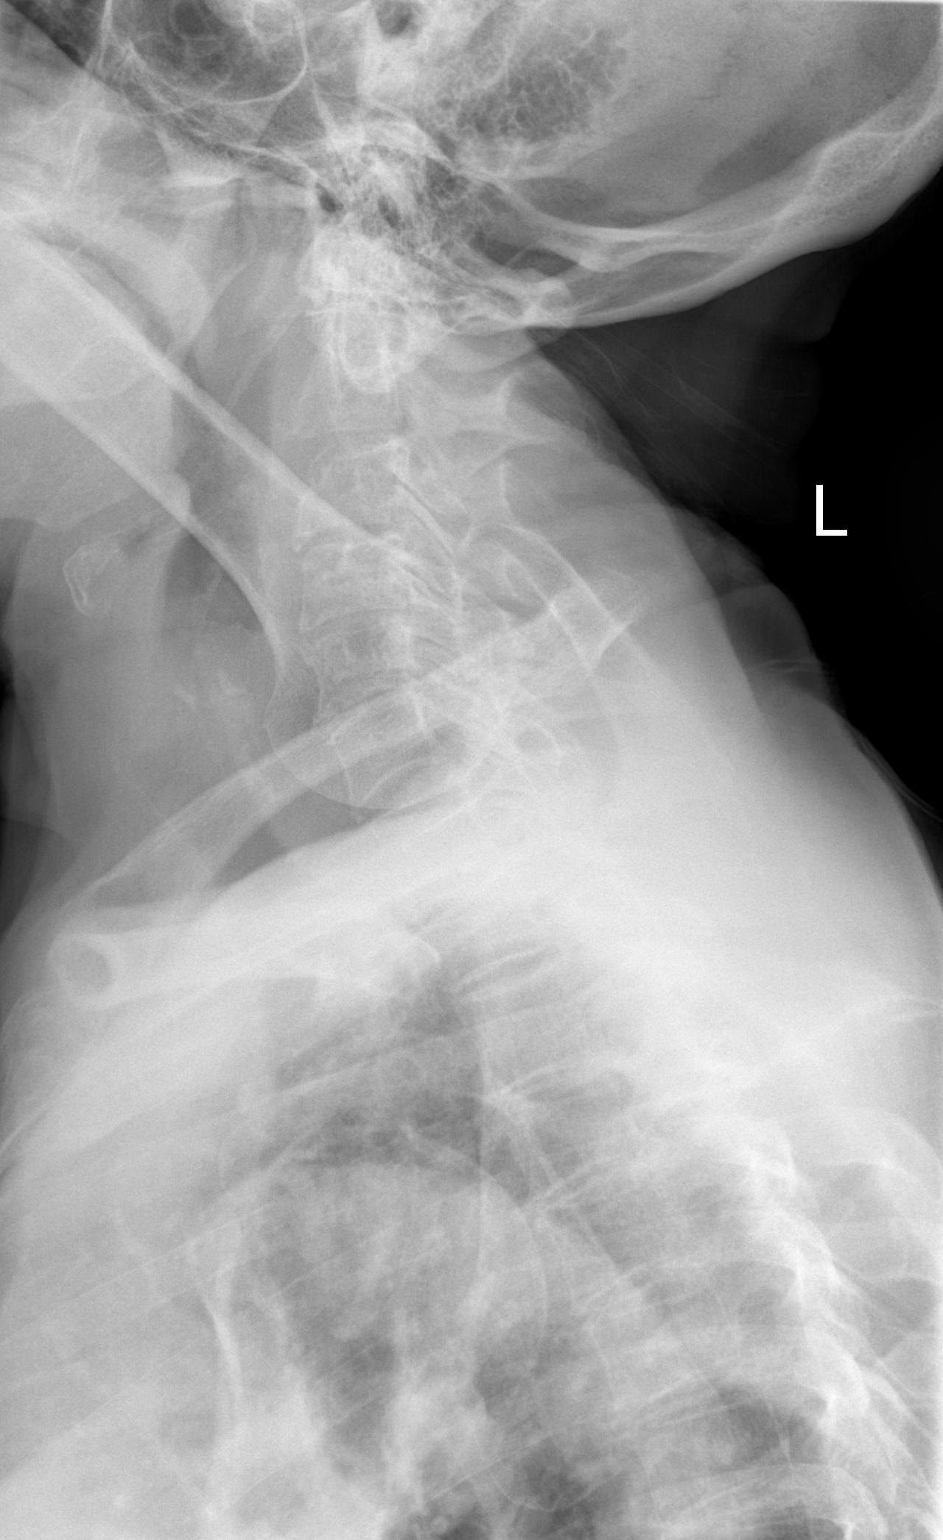

[4 of 4 positions shown; findings below may reference images not displayed]

FINDINGS: There is no fracture or bone destruction. There is a thoracic
scoliosis with multilevel degenerative disc disease in the mid
thoracic spine.
IMPRESSION: No acute abnormality of the thoracic spine.

## 2014-05-30 IMAGING — CT CT HEAD W/O CM
1 series · 16 of 30 positions shown, 20 images · non-contrast
Comparison: 01/21/2013.

CLINICAL DATA: Bilateral leg and foot numbness.

EXAM:
CT HEAD WITHOUT CONTRAST
TECHNIQUE: Contiguous axial images were obtained from the base of the skull
through the vertex without intravenous contrast.

[Series 2: head 5.0 h30s · axial · 0.41mm/px · z∈[-94,+41]mm · 16 of 30 slices shown, 20 images]
[im 2/30  brain]
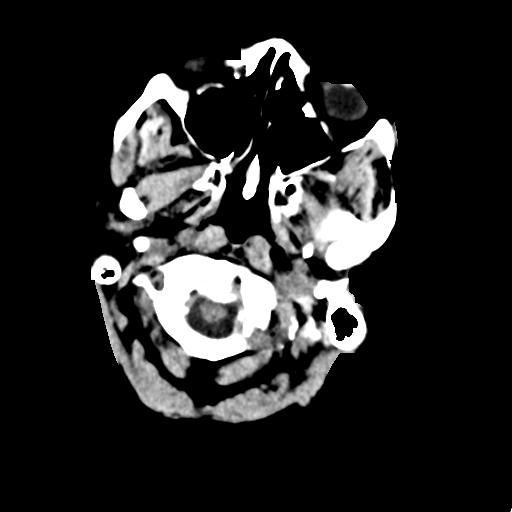
[im 2/30  bone]
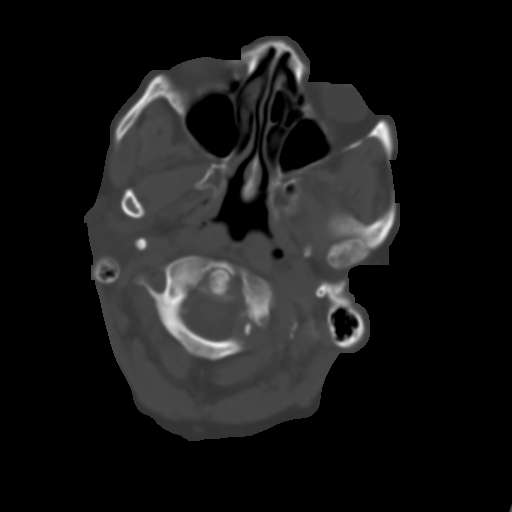
[im 4/30  brain]
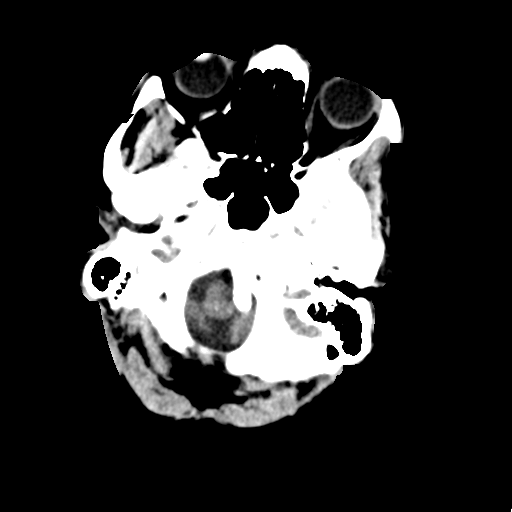
[im 6/30  brain]
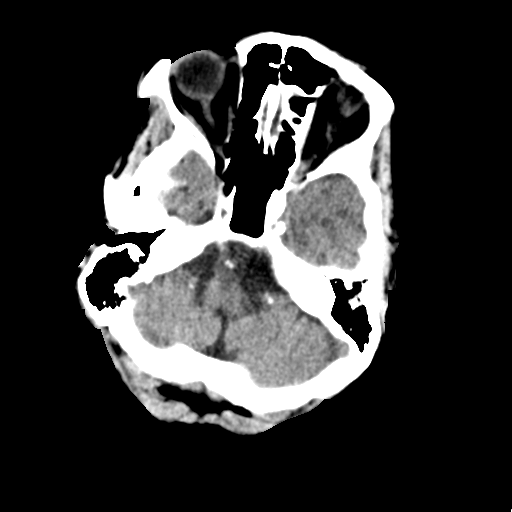
[im 8/30  brain]
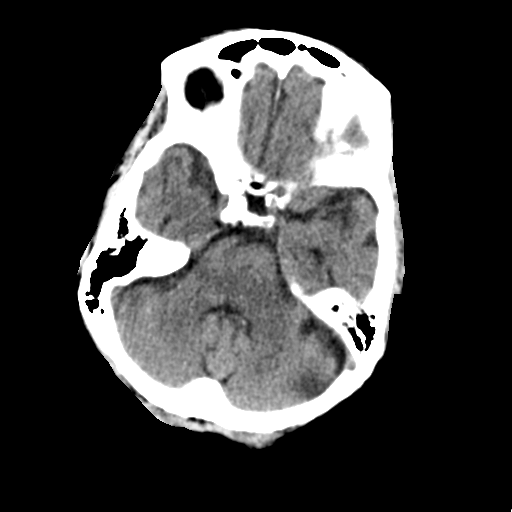
[im 9/30  brain]
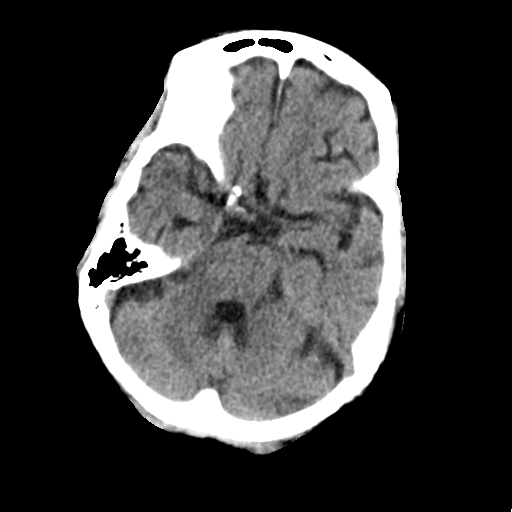
[im 9/30  bone]
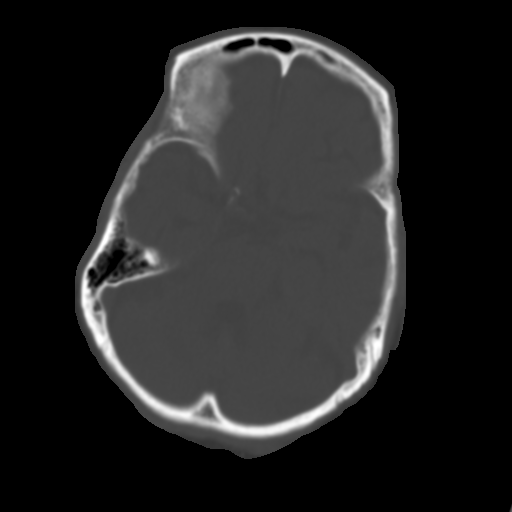
[im 11/30  brain]
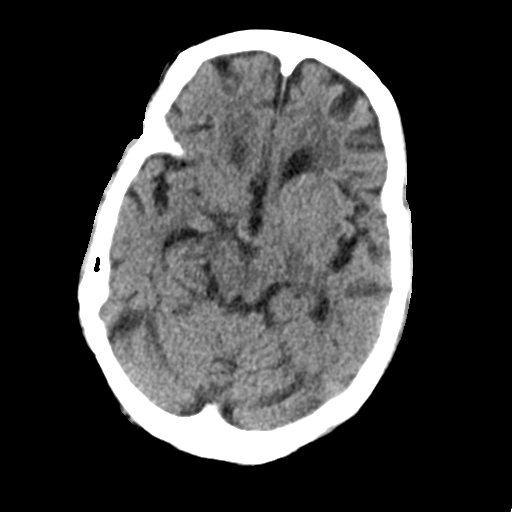
[im 13/30  brain]
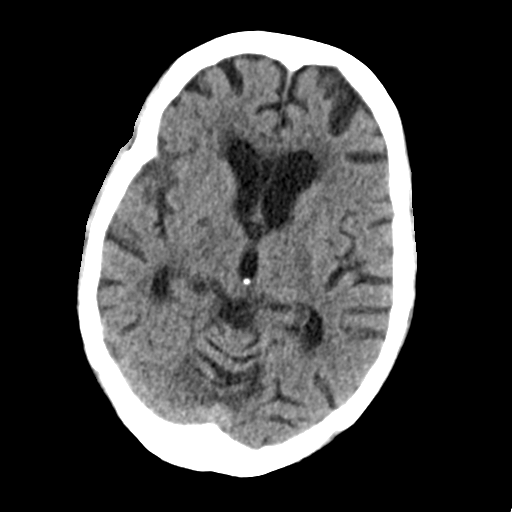
[im 15/30  brain]
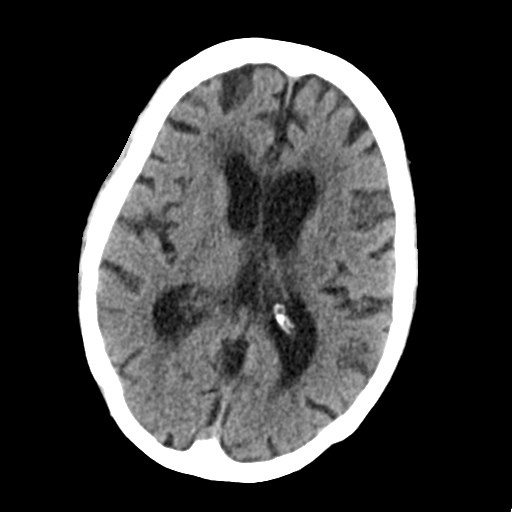
[im 16/30  brain]
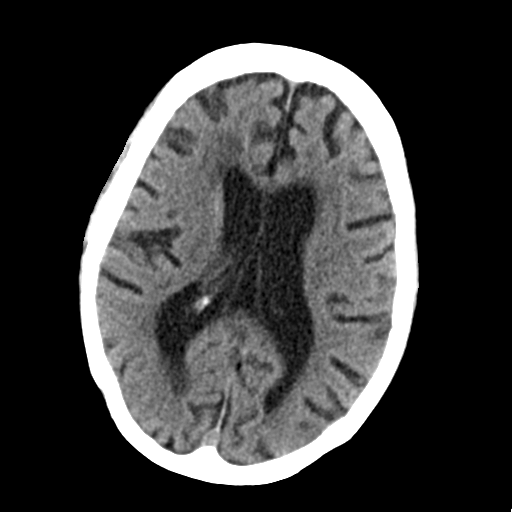
[im 16/30  bone]
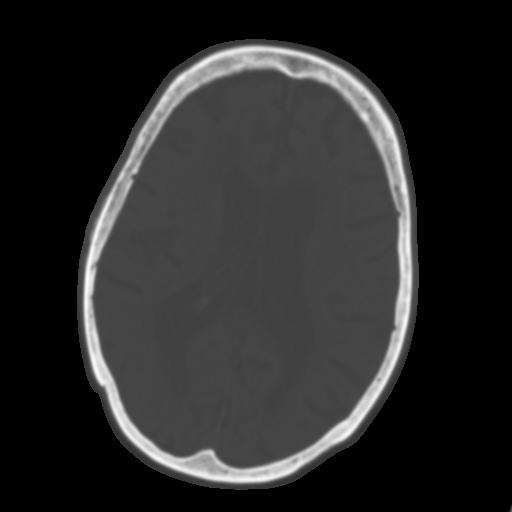
[im 18/30  brain]
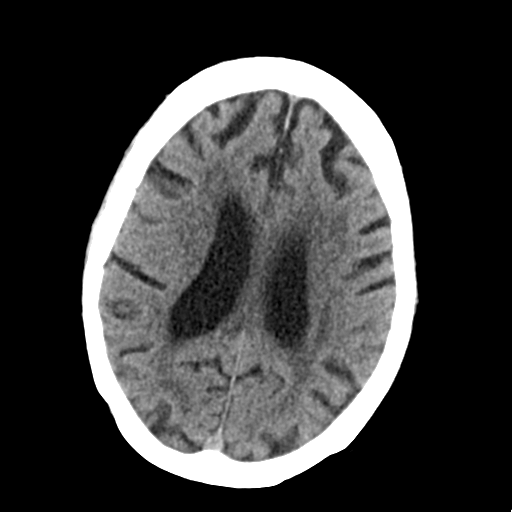
[im 20/30  brain]
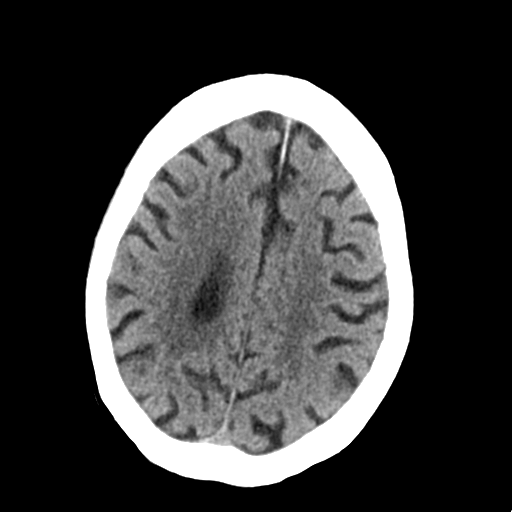
[im 22/30  brain]
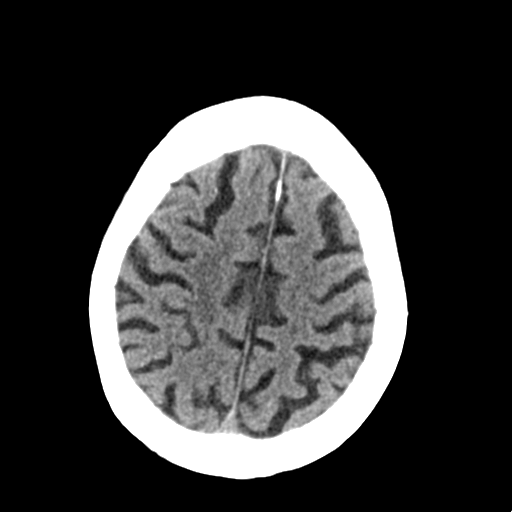
[im 23/30  brain]
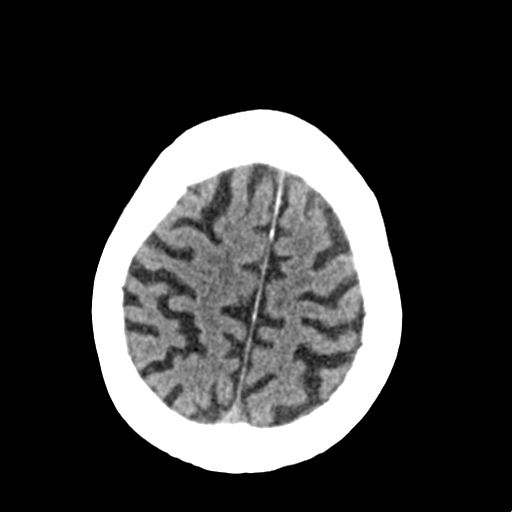
[im 23/30  bone]
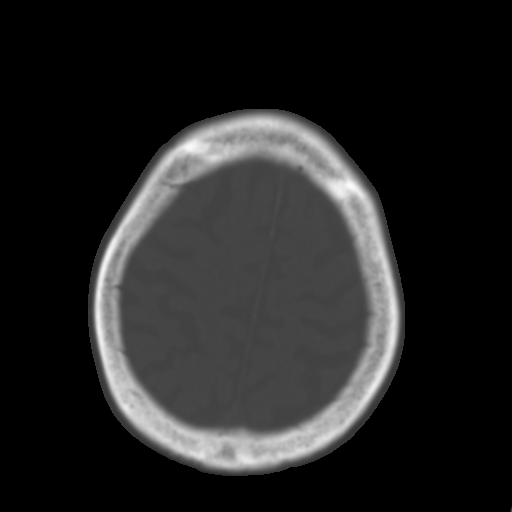
[im 25/30  brain]
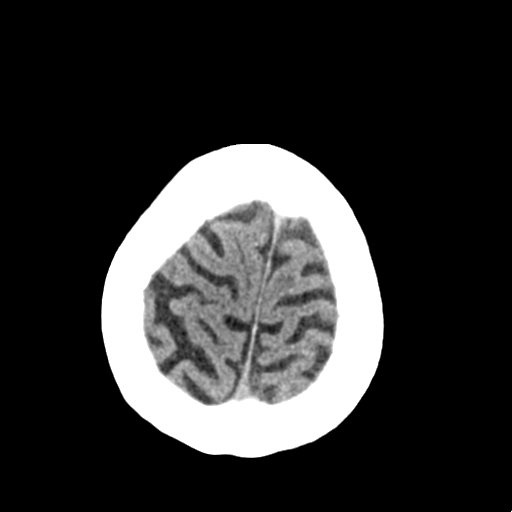
[im 27/30  brain]
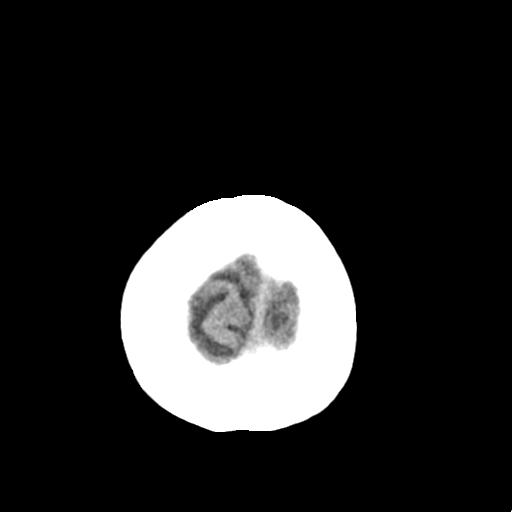
[im 29/30  brain]
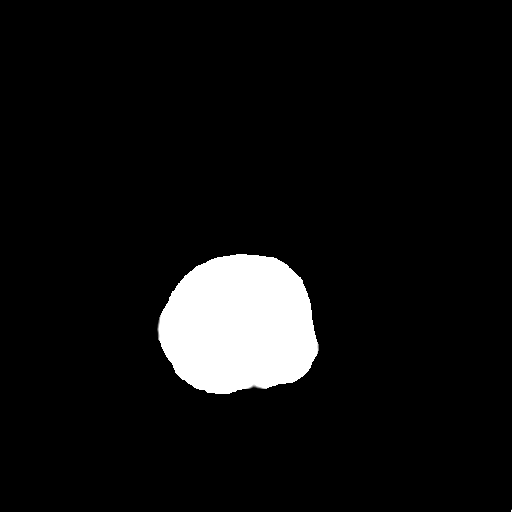

[16 of 30 positions shown; findings below may reference images not displayed]

FINDINGS: No evidence of an acute infarct, acute hemorrhage, mass lesion,
mass-effect or hydrocephalus. Probable remote lacunar infarct in the
right basal ganglia. Atrophy. Periventricular low attenuation.
Visualized portions of the paranasal sinuses and mastoid air cells
are clear.
IMPRESSION: 1. No acute intracranial abnormality.
2. Atrophy and chronic microvascular white matter ischemic changes.

## 2014-06-09 ENCOUNTER — Ambulatory Visit (HOSPITAL_BASED_OUTPATIENT_CLINIC_OR_DEPARTMENT_OTHER): Payer: Medicare Other | Admitting: Hematology and Oncology

## 2014-06-09 ENCOUNTER — Other Ambulatory Visit (HOSPITAL_BASED_OUTPATIENT_CLINIC_OR_DEPARTMENT_OTHER): Payer: Medicare Other

## 2014-06-09 ENCOUNTER — Telehealth: Payer: Self-pay | Admitting: Hematology and Oncology

## 2014-06-09 ENCOUNTER — Encounter: Payer: Self-pay | Admitting: Hematology and Oncology

## 2014-06-09 DIAGNOSIS — R5381 Other malaise: Secondary | ICD-10-CM | POA: Diagnosis not present

## 2014-06-09 DIAGNOSIS — Z85038 Personal history of other malignant neoplasm of large intestine: Secondary | ICD-10-CM | POA: Diagnosis not present

## 2014-06-09 DIAGNOSIS — F17200 Nicotine dependence, unspecified, uncomplicated: Secondary | ICD-10-CM | POA: Diagnosis not present

## 2014-06-09 DIAGNOSIS — C50919 Malignant neoplasm of unspecified site of unspecified female breast: Secondary | ICD-10-CM | POA: Diagnosis not present

## 2014-06-09 DIAGNOSIS — C189 Malignant neoplasm of colon, unspecified: Secondary | ICD-10-CM | POA: Diagnosis not present

## 2014-06-09 DIAGNOSIS — R269 Unspecified abnormalities of gait and mobility: Secondary | ICD-10-CM | POA: Diagnosis not present

## 2014-06-09 DIAGNOSIS — E119 Type 2 diabetes mellitus without complications: Secondary | ICD-10-CM | POA: Diagnosis not present

## 2014-06-09 DIAGNOSIS — C50911 Malignant neoplasm of unspecified site of right female breast: Secondary | ICD-10-CM

## 2014-06-09 DIAGNOSIS — D509 Iron deficiency anemia, unspecified: Secondary | ICD-10-CM

## 2014-06-09 DIAGNOSIS — Z9181 History of falling: Secondary | ICD-10-CM | POA: Diagnosis not present

## 2014-06-09 DIAGNOSIS — M79609 Pain in unspecified limb: Secondary | ICD-10-CM | POA: Diagnosis not present

## 2014-06-09 DIAGNOSIS — IMO0001 Reserved for inherently not codable concepts without codable children: Secondary | ICD-10-CM | POA: Diagnosis not present

## 2014-06-09 DIAGNOSIS — M546 Pain in thoracic spine: Secondary | ICD-10-CM

## 2014-06-09 DIAGNOSIS — Z853 Personal history of malignant neoplasm of breast: Secondary | ICD-10-CM

## 2014-06-09 DIAGNOSIS — M25559 Pain in unspecified hip: Secondary | ICD-10-CM | POA: Diagnosis not present

## 2014-06-09 DIAGNOSIS — I359 Nonrheumatic aortic valve disorder, unspecified: Secondary | ICD-10-CM | POA: Diagnosis not present

## 2014-06-09 DIAGNOSIS — R5383 Other fatigue: Secondary | ICD-10-CM | POA: Diagnosis not present

## 2014-06-09 DIAGNOSIS — C50912 Malignant neoplasm of unspecified site of left female breast: Secondary | ICD-10-CM | POA: Insufficient documentation

## 2014-06-09 HISTORY — DX: Hypercalcemia: E83.52

## 2014-06-09 LAB — CBC WITH DIFFERENTIAL/PLATELET
BASO%: 0.6 % (ref 0.0–2.0)
Basophils Absolute: 0 10*3/uL (ref 0.0–0.1)
EOS%: 2.2 % (ref 0.0–7.0)
Eosinophils Absolute: 0.1 10*3/uL (ref 0.0–0.5)
HCT: 30.8 % — ABNORMAL LOW (ref 34.8–46.6)
HEMOGLOBIN: 9.4 g/dL — AB (ref 11.6–15.9)
LYMPH%: 26.7 % (ref 14.0–49.7)
MCH: 22.1 pg — ABNORMAL LOW (ref 25.1–34.0)
MCHC: 30.7 g/dL — ABNORMAL LOW (ref 31.5–36.0)
MCV: 71.9 fL — ABNORMAL LOW (ref 79.5–101.0)
MONO#: 0.3 10*3/uL (ref 0.1–0.9)
MONO%: 6 % (ref 0.0–14.0)
NEUT#: 3 10*3/uL (ref 1.5–6.5)
NEUT%: 64.5 % (ref 38.4–76.8)
Platelets: 184 10*3/uL (ref 145–400)
RBC: 4.28 10*6/uL (ref 3.70–5.45)
RDW: 15.8 % — AB (ref 11.2–14.5)
WBC: 4.6 10*3/uL (ref 3.9–10.3)
lymph#: 1.2 10*3/uL (ref 0.9–3.3)

## 2014-06-09 LAB — COMPREHENSIVE METABOLIC PANEL (CC13)
ALT: 7 U/L (ref 0–55)
AST: 10 U/L (ref 5–34)
Albumin: 3.2 g/dL — ABNORMAL LOW (ref 3.5–5.0)
Alkaline Phosphatase: 48 U/L (ref 40–150)
Anion Gap: 7 mEq/L (ref 3–11)
BILIRUBIN TOTAL: 0.23 mg/dL (ref 0.20–1.20)
BUN: 13.8 mg/dL (ref 7.0–26.0)
CO2: 25 mEq/L (ref 22–29)
CREATININE: 1.1 mg/dL (ref 0.6–1.1)
Calcium: 10.3 mg/dL (ref 8.4–10.4)
Chloride: 108 mEq/L (ref 98–109)
Glucose: 246 mg/dl — ABNORMAL HIGH (ref 70–140)
POTASSIUM: 4.2 meq/L (ref 3.5–5.1)
Sodium: 139 mEq/L (ref 136–145)
Total Protein: 6.9 g/dL (ref 6.4–8.3)

## 2014-06-09 LAB — FERRITIN CHCC: FERRITIN: 335 ng/mL — AB (ref 9–269)

## 2014-06-09 MED ORDER — HYDROCODONE-ACETAMINOPHEN 5-325 MG PO TABS: 1.0000 | ORAL_TABLET | Freq: Four times a day (QID) | ORAL | Status: DC | PRN

## 2014-06-09 NOTE — Assessment & Plan Note (Signed)
This is chronic in nature. I will order parathyroid hormone test for further investigation.

## 2014-06-09 NOTE — Assessment & Plan Note (Signed)
She has no evidence of recurrence. Continue history and examination only. 

## 2014-06-09 NOTE — Assessment & Plan Note (Signed)
I will recheck iron studies. There is a possibility she might have undiagnosed thalassemia. I will order an additional workup.

## 2014-06-09 NOTE — Assessment & Plan Note (Signed)
Clinically, she has no recurrence. She'll continue on tamoxifen.

## 2014-06-09 NOTE — Assessment & Plan Note (Signed)
This is likely related to degenerative arthritis. I am concerned about hypercalcemia and will order an additional workup. I gave her a one-time prescription pain medicine I recommend she continues her pain prescription with her primary care provider.

## 2014-06-09 NOTE — Telephone Encounter (Signed)
Pt confirmed labs/ov per 07/28 POF, gave pt AVS....KJ °

## 2014-06-09 NOTE — Progress Notes (Signed)
[facility] OFFICE PROGRESS NOTE  Patient Care Team: [name], MD as PCP - General Nira Retort, MD as Consulting Physician (Hematology and Oncology)  SUMMARY OF ONCOLOGIC HISTORY: This is a pleasant lady who was found to have problems with breast cancer from screening mammogram. She has simple bilateral mastectomies and bilateral axillary lymph node dissection in 2001. On the right breast, showed a T1N0M0 left breast to have T1N1M0, both of them stained strongly for estrogen receptor positivity. She was placed on tamoxifen from January 2012.  INTERVAL HISTORY: Please see below for problem oriented charting. She returns today for further followup. She denies any recent abnormal breast examination, palpable mass, abnormal breast appearance or nipple changes She denies bleeding. She has chronic back pain, resolved with taking hydrocodone. She has peripheral neuropathy, presumably related to diabetes. The patient complained of recurrent gout.  REVIEW OF SYSTEMS:   Constitutional: Denies fevers, chills or abnormal weight loss Eyes: Denies blurriness of vision Ears, nose, mouth, throat, and face: Denies mucositis or sore throat Respiratory: Denies cough, dyspnea or wheezes Cardiovascular: Denies palpitation, chest discomfort or lower extremity swelling Gastrointestinal:  Denies nausea, heartburn or change in bowel habits Skin: Denies abnormal skin rashes Lymphatics: Denies new lymphadenopathy or easy bruising Neurological:Denies numbness, tingling or new weaknesses Behavioral/Psych: Mood is stable, no new changes  All other systems were reviewed with the patient and are negative.  I have reviewed the past medical history, past surgical history, social history and family history with the patient and they are unchanged from previous note.  ALLERGIES:  is allergic to peanuts and lisinopril.  MEDICATIONS:  Current Outpatient Prescriptions  Medication Sig  Dispense Refill  . albuterol (PROVENTIL HFA;VENTOLIN HFA) 108 (90 BASE) MCG/ACT inhaler Inhale 2 puffs into the lungs every 6 (six) hours as needed. For shortness of breath  1 Inhaler  6  . amLODipine (NORVASC) 10 MG tablet Take 1 tablet (10 mg total) by mouth daily.  30 tablet  3  . aspirin (ASPIRIN CHILDRENS) 81 MG chewable tablet Chew 81 mg by mouth daily.        . colchicine 0.6 MG tablet Take 1 tablet (0.6 mg total) by mouth 2 (two) times daily.  30 tablet  0  . furosemide (LASIX) 20 MG tablet Take 1 tablet (20 mg total) by mouth daily.  30 tablet  3  . gabapentin (NEURONTIN) 300 MG capsule Take 1 capsule (300 mg total) by mouth 2 (two) times daily.  60 capsule  0  . HYDROcodone-acetaminophen (NORCO/VICODIN) 5-325 MG per tablet Take 1 tablet by mouth every 6 (six) hours as needed for moderate pain.  30 tablet  0  . losartan (COZAAR) 50 MG tablet Take 50 mg by mouth daily.      . magnesium hydroxide (MILK OF MAGNESIA) 400 MG/5ML suspension Take 30 mLs by mouth daily as needed for mild constipation.      . metFORMIN (GLUCOPHAGE) 500 MG tablet Take 500 mg by mouth 2 (two) times daily with a meal.      . mirtazapine (REMERON) 15 MG tablet Take 15 mg by mouth at bedtime as needed (sleep).      Marland Kitchen PATADAY 0.2 % SOLN Place 1 drop into both eyes daily as needed. For itchy eyes      . potassium chloride (K-DUR) 10 MEQ tablet Take 1 tablet (10 mEq total) by mouth daily. Take with the lasix  30 tablet  0  . ranitidine (ZANTAC) 150 MG capsule Take  1 capsule (150 mg total) by mouth 2 (two) times daily as needed for heartburn.  60 capsule  3  . rosuvastatin (CRESTOR) 20 MG tablet Take 20 mg by mouth daily.      . tamoxifen (NOLVADEX) 20 MG tablet Take 1 tablet (20 mg total) by mouth daily.  30 tablet  4  . [DISCONTINUED] cetirizine (ZYRTEC) 5 MG tablet Take 1 tablet (5 mg total) by mouth daily. Take at night time as it can cause some drowsiness  30 tablet  4   No current facility-administered medications  for this visit.    PHYSICAL EXAMINATION: ECOG PERFORMANCE STATUS: 2 - Symptomatic, <50% confined to bed  Filed Vitals:   06/09/14 1129  BP: 123/47  Pulse: 72  Temp: 98.4 F (36.9 C)  Resp: 20   Filed Weights   06/09/14 1129  Weight: 125 lb (56.7 kg)    GENERAL:alert, no distress and comfortable. She looked elderly, sitting on the wheelchair. SKIN: skin color, texture, turgor are normal, no rashes or significant lesions EYES: normal, Conjunctiva are pink and non-injected, sclera clear OROPHARYNX:no exudate, no erythema and lips, buccal mucosa, and tongue normal  NECK: supple, thyroid normal size, non-tender, without nodularity LYMPH:  no palpable lymphadenopathy in the cervical, axillary or inguinal LUNGS: clear to auscultation and percussion with normal breathing effort HEART: regular rate & rhythm and no murmurs and no lower extremity edema ABDOMEN:abdomen soft, non-tender and normal bowel sounds Musculoskeletal:no cyanosis of digits and no clubbing  NEURO: alert & oriented x 3 with fluent speech, no focal motor/sensory deficits  LABORATORY DATA:  I have reviewed the data as listed    Component Value Date/Time   NA 139 06/09/2014 1107   NA 140 04/29/2014 1050   K 4.2 06/09/2014 1107   K 4.4 04/29/2014 1050   CL 101 04/29/2014 1050   CL 105 12/11/2012 1349   CO2 25 06/09/2014 1107   CO2 23 04/29/2014 1050   GLUCOSE 246* 06/09/2014 1107   GLUCOSE 206* 04/29/2014 1050   GLUCOSE 218* 12/11/2012 1349   BUN 13.8 06/09/2014 1107   BUN 18 04/29/2014 1050   CREATININE 1.1 06/09/2014 1107   CREATININE 0.94 04/29/2014 1050   CREATININE 1.07 04/23/2013 1650   CALCIUM 10.3 06/09/2014 1107   CALCIUM 10.9* 04/29/2014 1050   PROT 6.9 06/09/2014 1107   PROT 7.8 04/29/2014 1050   ALBUMIN 3.2* 06/09/2014 1107   ALBUMIN 3.3* 04/29/2014 1050   AST 10 06/09/2014 1107   AST 12 04/29/2014 1050   ALT 7 06/09/2014 1107   ALT 6 04/29/2014 1050   ALKPHOS 48 06/09/2014 1107   ALKPHOS 52 04/29/2014 1050    BILITOT 0.23 06/09/2014 1107   BILITOT <0.2* 04/29/2014 1050   GFRNONAA 53* 04/29/2014 1050   GFRAA 61* 04/29/2014 1050    No results found for this basename: SPEP,  UPEP,   kappa and lambda light chains    Lab Results  Component Value Date   WBC 4.6 06/09/2014   NEUTROABS 3.0 06/09/2014   HGB 9.4* 06/09/2014   HCT 30.8* 06/09/2014   MCV 71.9* 06/09/2014   PLT 184 06/09/2014      Chemistry      Component Value Date/Time   NA 139 06/09/2014 1107   NA 140 04/29/2014 1050   K 4.2 06/09/2014 1107   K 4.4 04/29/2014 1050   CL 101 04/29/2014 1050   CL 105 12/11/2012 1349   CO2 25 06/09/2014 1107   CO2 23 04/29/2014 1050  BUN 13.8 06/09/2014 1107   BUN 18 04/29/2014 1050   CREATININE 1.1 06/09/2014 1107   CREATININE 0.94 04/29/2014 1050   CREATININE 1.07 04/23/2013 1650      Component Value Date/Time   CALCIUM 10.3 06/09/2014 1107   CALCIUM 10.9* 04/29/2014 1050   ALKPHOS 48 06/09/2014 1107   ALKPHOS 52 04/29/2014 1050   AST 10 06/09/2014 1107   AST 12 04/29/2014 1050   ALT 7 06/09/2014 1107   ALT 6 04/29/2014 1050   BILITOT 0.23 06/09/2014 1107   BILITOT <0.2* 04/29/2014 1050      ASSESSMENT & PLAN:  Breast cancer, left breast Clinically, she has no recurrence. She'll continue on tamoxifen.  Colon cancer She has no evidence of recurrence. Continue history and examination only.  Thoracic back pain This is likely related to degenerative arthritis. I am concerned about hypercalcemia and will order an additional workup. I gave her a one-time prescription pain medicine I recommend she continues her pain prescription with her primary care provider.  Hypercalcemia This is chronic in nature. I will order parathyroid hormone test for further investigation.  ANEMIA-IRON DEFICIENCY I will recheck iron studies. There is a possibility she might have undiagnosed thalassemia. I will order an additional workup.    Orders Placed This Encounter  Procedures  . CBC with Differential    Standing  Status: Future     Number of Occurrences:      Standing Expiration Date: 06/09/2015  . Comprehensive metabolic panel    Standing Status: Future     Number of Occurrences:      Standing Expiration Date: 06/09/2015  . SPEP & IFE with QIG    Standing Status: Future     Number of Occurrences:      Standing Expiration Date: 06/09/2015  . Kappa/lambda light chains    Standing Status: Future     Number of Occurrences:      Standing Expiration Date: 06/09/2015  . PTH, intact and calcium    Standing Status: Future     Number of Occurrences: 1     Standing Expiration Date: 06/09/2015   All questions were answered. The patient knows to call the clinic with any problems, questions or concerns. No barriers to learning was detected. I spent 25 minutes counseling the patient face to face. The total time spent in the appointment was 30 minutes and more than 50% was on counseling and review of test results     [facility], [place], MD 06/09/2014 12:06 PM

## 2014-06-10 LAB — CEA: CEA: 2.8 ng/mL (ref 0.0–5.0)

## 2014-06-10 LAB — PTH, INTACT AND CALCIUM
Calcium: 10 mg/dL (ref 8.4–10.5)
PTH: 80.7 pg/mL — AB (ref 14.0–72.0)

## 2014-06-11 LAB — HEMOGLOBINOPATHY EVALUATION
HEMOGLOBIN OTHER: 0 %
HGB A2 QUANT: 2.4 % (ref 2.2–3.2)
HGB F QUANT: 0 % (ref 0.0–2.0)
HGB S QUANTITAION: 0 %
Hgb A: 97.6 % (ref 96.8–97.8)

## 2014-06-13 ENCOUNTER — Encounter (HOSPITAL_COMMUNITY): Payer: Self-pay | Admitting: Emergency Medicine

## 2014-06-13 ENCOUNTER — Emergency Department (HOSPITAL_COMMUNITY)
Admission: EM | Admit: 2014-06-13 | Discharge: 2014-06-13 | Disposition: A | Discharge status: Home / Self Care | Payer: Medicare Other | Attending: Emergency Medicine | Admitting: Emergency Medicine

## 2014-06-13 DIAGNOSIS — M129 Arthropathy, unspecified: Secondary | ICD-10-CM | POA: Insufficient documentation

## 2014-06-13 DIAGNOSIS — I2510 Atherosclerotic heart disease of native coronary artery without angina pectoris: Secondary | ICD-10-CM | POA: Insufficient documentation

## 2014-06-13 DIAGNOSIS — M549 Dorsalgia, unspecified: Secondary | ICD-10-CM | POA: Diagnosis not present

## 2014-06-13 DIAGNOSIS — I10 Essential (primary) hypertension: Secondary | ICD-10-CM | POA: Insufficient documentation

## 2014-06-13 DIAGNOSIS — M25519 Pain in unspecified shoulder: Secondary | ICD-10-CM | POA: Diagnosis not present

## 2014-06-13 DIAGNOSIS — I252 Old myocardial infarction: Secondary | ICD-10-CM | POA: Diagnosis not present

## 2014-06-13 DIAGNOSIS — Z8701 Personal history of pneumonia (recurrent): Secondary | ICD-10-CM | POA: Diagnosis not present

## 2014-06-13 DIAGNOSIS — Z9889 Other specified postprocedural states: Secondary | ICD-10-CM | POA: Diagnosis not present

## 2014-06-13 DIAGNOSIS — Z853 Personal history of malignant neoplasm of breast: Secondary | ICD-10-CM | POA: Insufficient documentation

## 2014-06-13 DIAGNOSIS — K219 Gastro-esophageal reflux disease without esophagitis: Secondary | ICD-10-CM | POA: Insufficient documentation

## 2014-06-13 DIAGNOSIS — J45909 Unspecified asthma, uncomplicated: Secondary | ICD-10-CM | POA: Diagnosis not present

## 2014-06-13 DIAGNOSIS — Z7982 Long term (current) use of aspirin: Secondary | ICD-10-CM | POA: Diagnosis not present

## 2014-06-13 DIAGNOSIS — Z862 Personal history of diseases of the blood and blood-forming organs and certain disorders involving the immune mechanism: Secondary | ICD-10-CM | POA: Diagnosis not present

## 2014-06-13 DIAGNOSIS — E119 Type 2 diabetes mellitus without complications: Secondary | ICD-10-CM | POA: Insufficient documentation

## 2014-06-13 DIAGNOSIS — Z85038 Personal history of other malignant neoplasm of large intestine: Secondary | ICD-10-CM | POA: Diagnosis not present

## 2014-06-13 DIAGNOSIS — E785 Hyperlipidemia, unspecified: Secondary | ICD-10-CM | POA: Insufficient documentation

## 2014-06-13 DIAGNOSIS — Z79899 Other long term (current) drug therapy: Secondary | ICD-10-CM | POA: Diagnosis not present

## 2014-06-13 DIAGNOSIS — M25511 Pain in right shoulder: Secondary | ICD-10-CM

## 2014-06-13 DIAGNOSIS — I509 Heart failure, unspecified: Secondary | ICD-10-CM | POA: Insufficient documentation

## 2014-06-13 DIAGNOSIS — M542 Cervicalgia: Secondary | ICD-10-CM | POA: Diagnosis not present

## 2014-06-13 DIAGNOSIS — M25512 Pain in left shoulder: Secondary | ICD-10-CM

## 2014-06-13 MED ORDER — MORPHINE SULFATE 4 MG/ML IJ SOLN
6.0000 mg | Freq: Once | INTRAMUSCULAR | Status: AC
  Administered 2014-06-13: 6 mg via INTRAMUSCULAR
  Filled 2014-06-13: qty 2

## 2014-06-13 MED ORDER — LORAZEPAM 1 MG PO TABS
1.0000 mg | ORAL_TABLET | Freq: Once | ORAL | Status: AC
  Administered 2014-06-13: 1 mg via ORAL
  Filled 2014-06-13: qty 1

## 2014-06-13 NOTE — ED Notes (Signed)
Family at bedside. 

## 2014-06-13 NOTE — ED Notes (Signed)
Bed: WA10 Expected date: 06/13/14 Expected time: 4:01 AM Means of arrival: Ambulance Comments: 80 yo F  Pain all over

## 2014-06-13 NOTE — Discharge Instructions (Signed)

## 2014-06-13 NOTE — ED Notes (Signed)
Patient states she has chronic pain and that she is hurting all over. She has been hurting since Tuesday and has not taken her prescribed norco. She states that she was sleeping a lot and that her daughter or son did not give her pain meds just her other medicines. Patient also complained of waking up cold, but then says her children turn the air condition in the house so she is often cold. EMS was told by the daughter after he asked about prescribed pain medicine that "we always call 911, she needs to go".

## 2014-06-20 IMAGING — CR DG CHEST 2V
2 series · 2 of 2 positions shown · non-contrast
Comparison: None.

CLINICAL DATA: Shortness of breath.

EXAM:
CHEST  2 VIEW

[x chest ap]
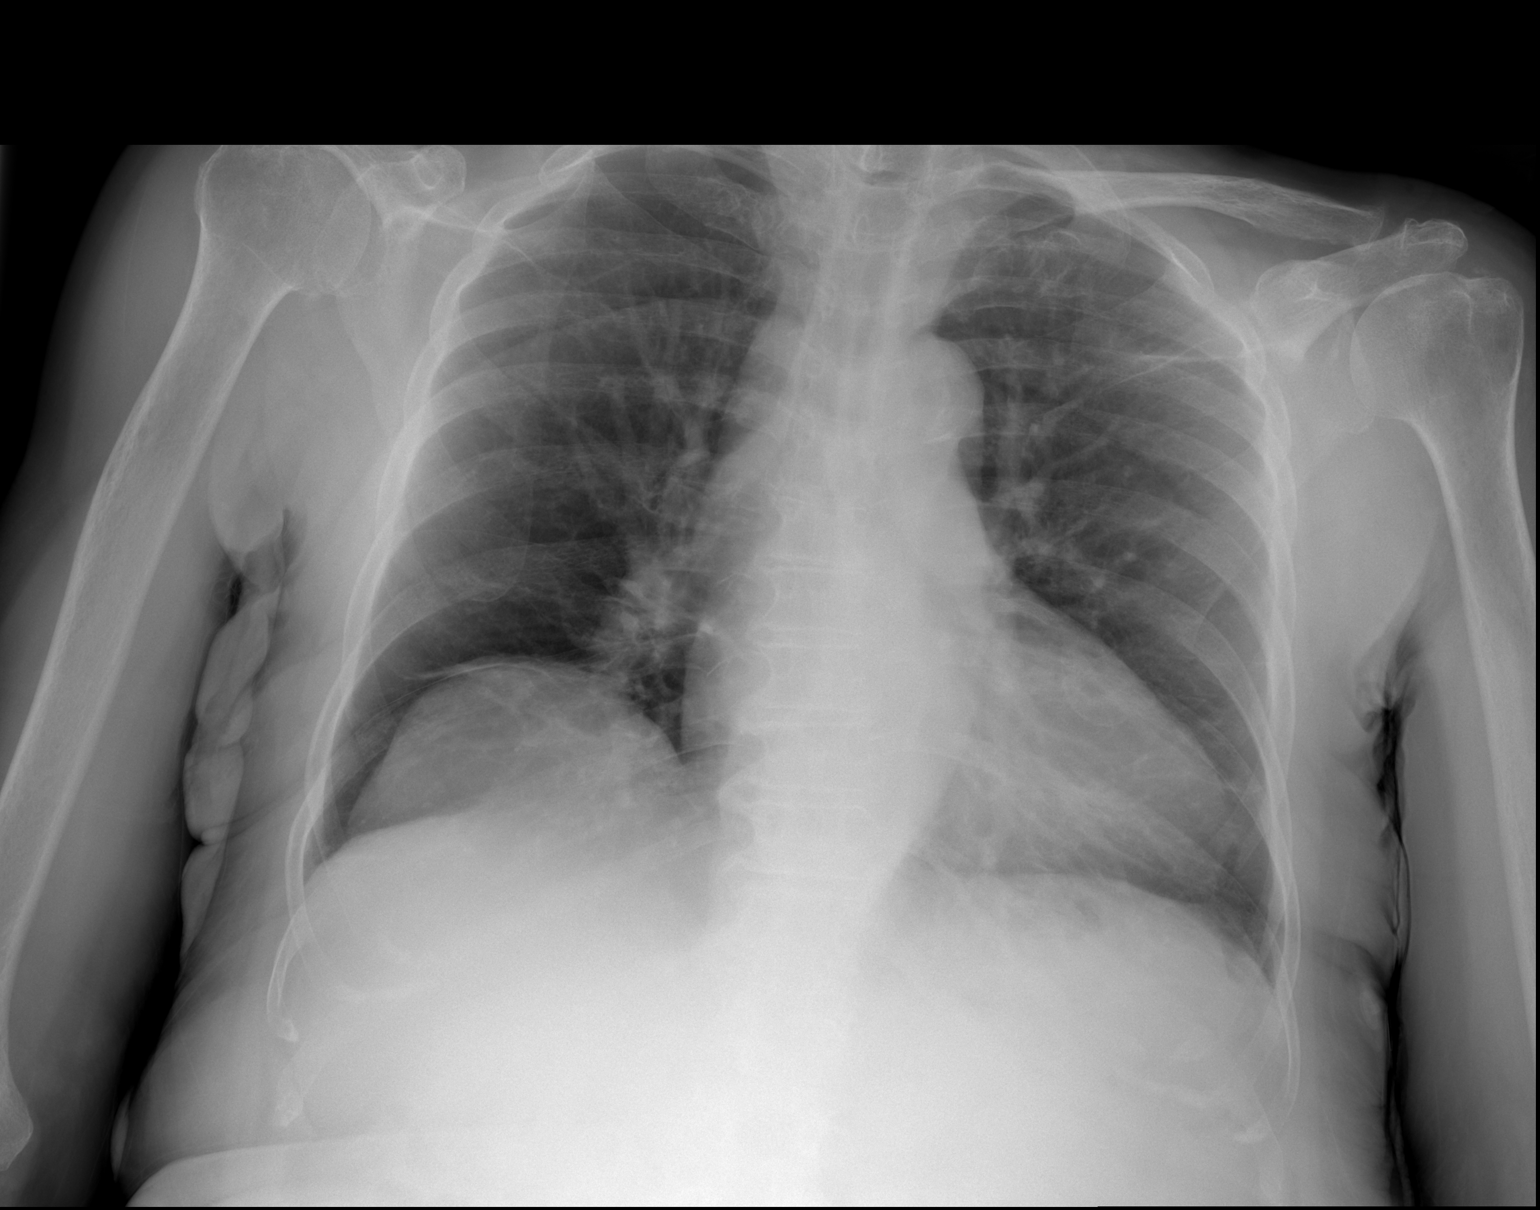

[w chest lat]
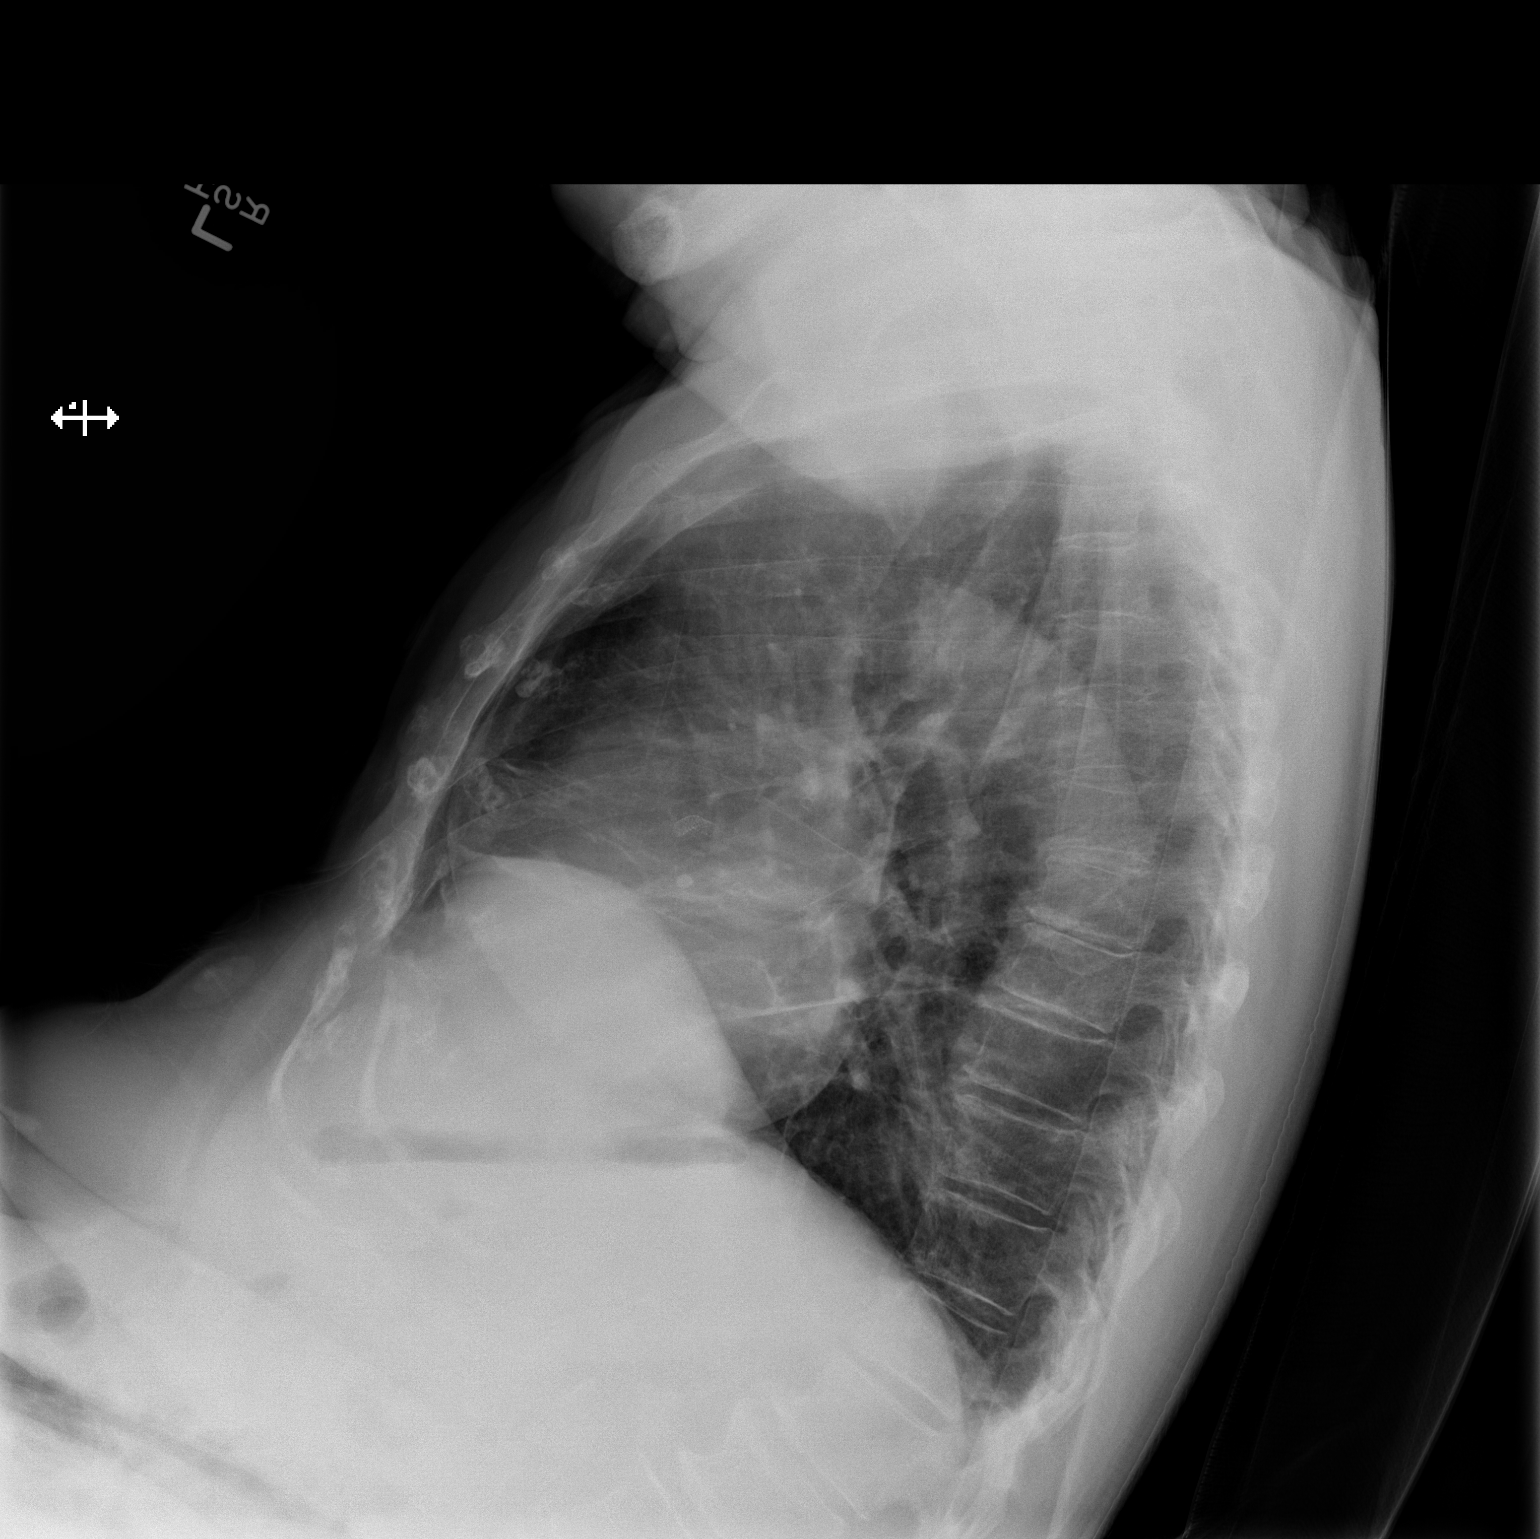

[2 of 2 positions shown; findings below may reference images not displayed]

FINDINGS: Mediastinum and hilar structures normal. Subsegmental atelectasis
and/or scarring lung bases noted. No pleural effusion or
pneumothorax. No acute bony abnormality. Mild cardiomegaly, no CHF .
Degenerative changes thoracic spine.
IMPRESSION: 1. Bibasilar subsegmental atelectasis and/or scarring. No acute
cardiopulmonary disease noted.

2. Mild cardiomegaly, no CHF.

## 2014-06-21 NOTE — ED Provider Notes (Addendum)
CSN: [id number]     Arrival date & time 06/13/14  0417 History   First MD Initiated Contact with Patient 06/13/14 0421     Chief Complaint  Patient presents with  . Pain     (Consider location/radiation/quality/duration/timing/severity/associated sxs/prior Treatment) HPI  80 year old female with back pain. Chronic in nature. Worse since about Tuesday. Denies any acute trauma. Patient is prescribed hydrocodone but gets it inconsistently. Daughter at bedside confirms that has been complaingin of back pain for years. No new recent complaints. No fever or chills. No numbness or tingling. Strength has been fine.   Past Medical History  Diagnosis Date  . Breast cancer 10/2010    Invasive Ductal Carcinoma, s/p bilateral mastectomy, now on Tamoxifen. Followed by Dr Jamse Arn.   . Diabetes mellitus   . Hypertension   . Hyperlipidemia   . Anemia     Iron def anemia  . CAD (coronary artery disease)   . CHF (congestive heart failure)     Systolic   . History of colon cancer   . Neuropathy   . Allergy   . GERD (gastroesophageal reflux disease)   . Hiatal hernia   . Diverticulosis   . Asthma   . Arthritis   . H/O: GI bleed   . Breast cancer 2011    s/p Bilateral masectomy  . Shortness of breath   . Pneumonia 03/2012  . Carotid artery aneurysm 08/2010    right ICA, 5 x 84mm  . DDD (degenerative disc disease), cervical   . Colon cancer   . Colon cancer 12/11/2013  . Myocardial infarction   . Hypercalcemia 06/09/2014   Past Surgical History  Procedure Laterality Date  . Breast masectomy  10/2010    Bilateral masectomy by Dr Lucia Gaskins s/p invasive ductal carcinoma.  . Cardiac  catherization  2007    Severe 3-vessel disease.  EF 20-25%.  . Esophagogastroduodenoscopy  2001    Esophageal Tear  . Total knee arthroplasty  2001  . Esophagogastroduodenoscopy  10/27/2012    Procedure: ESOPHAGOGASTRODUODENOSCOPY (EGD);  Surgeon: Irene Shipper, MD;  Location: [facility] ENDOSCOPY;  Service: Endoscopy;   Laterality: N/A;  pat  . Breast surgery     Family History  Problem Relation Age of Onset  . Sickle cell anemia Other   . Heart disease Mother   . Heart disease Father   . Cancer Daughter 55    breast ca  . Cancer Daughter 55    breast ca   History  Substance Use Topics  . Smoking status: Never Smoker   . Smokeless tobacco: Current User    Types: Snuff  . Alcohol Use: No   OB History   Grav Para Term Preterm Abortions TAB SAB Ect Mult Living                 Review of Systems  All systems reviewed and negative, other than as noted in HPI.   Allergies  Peanuts and Lisinopril  Home Medications   Prior to Admission medications   Medication Sig Start Date End Date Taking? Authorizing Provider  albuterol (PROVENTIL HFA;VENTOLIN HFA) 108 (90 BASE) MCG/ACT inhaler Inhale 2 puffs into the lungs every 6 (six) hours as needed. For shortness of breath 01/31/13  Yes Kandis Nab, MD  amLODipine (NORVASC) 10 MG tablet Take 1 tablet (10 mg total) by mouth daily. 03/25/14  Yes Melony Overly, MD  aspirin (ASPIRIN CHILDRENS) 81 MG chewable tablet Chew 81 mg by mouth daily.  Yes Historical Provider, MD  colchicine 0.6 MG tablet Take 1 tablet (0.6 mg total) by mouth 2 (two) times daily. 04/28/14  Yes Melony Overly, MD  furosemide (LASIX) 20 MG tablet Take 1 tablet (20 mg total) by mouth daily. 03/25/14  Yes Melony Overly, MD  gabapentin (NEURONTIN) 300 MG capsule Take 1 capsule (300 mg total) by mouth 2 (two) times daily. 01/25/14  Yes Veryl Speak, MD  HYDROcodone-acetaminophen (NORCO/VICODIN) 5-325 MG per tablet Take 1 tablet by mouth every 6 (six) hours as needed for moderate pain. 06/09/14  Yes Heath Lark, MD  losartan (COZAAR) 50 MG tablet Take 50 mg by mouth daily.   Yes Historical Provider, MD  magnesium hydroxide (MILK OF MAGNESIA) 400 MG/5ML suspension Take 30 mLs by mouth daily as needed for mild constipation.   Yes Historical Provider, MD  metFORMIN (GLUCOPHAGE) 500 MG tablet Take  500 mg by mouth 2 (two) times daily with a meal.   Yes Historical Provider, MD  mirtazapine (REMERON) 15 MG tablet Take 15 mg by mouth at bedtime as needed (sleep).   Yes Historical Provider, MD  PATADAY 0.2 % SOLN Place 1 drop into both eyes daily as needed. For itchy eyes 03/05/11  Yes Historical Provider, MD  potassium chloride (K-DUR) 10 MEQ tablet Take 1 tablet (10 mEq total) by mouth daily. Take with the lasix 01/14/14  Yes Kandis Nab, MD  ranitidine (ZANTAC) 150 MG capsule Take 1 capsule (150 mg total) by mouth 2 (two) times daily as needed for heartburn. 03/25/14  Yes Melony Overly, MD  rosuvastatin (CRESTOR) 20 MG tablet Take 20 mg by mouth daily.   Yes Historical Provider, MD  tamoxifen (NOLVADEX) 20 MG tablet Take 1 tablet (20 mg total) by mouth daily. 03/03/14  Yes Kandis Nab, MD   BP 158/54  Pulse 83  Temp(Src) 99 F (37.2 C) (Oral)  Resp 18  SpO2 95% Physical Exam  Nursing note and vitals reviewed. Constitutional: She appears well-developed and well-nourished. No distress.  HENT:  Head: Normocephalic and atraumatic.  Eyes: Conjunctivae are normal. Right eye exhibits no discharge. Left eye exhibits no discharge.  Neck: Neck supple.  Cardiovascular: Normal rate, regular rhythm and normal heart sounds.  Exam reveals no gallop and no friction rub.   No murmur heard. Pulmonary/Chest: Effort normal and breath sounds normal. No respiratory distress.  Abdominal: Soft. She exhibits no distension. There is no tenderness.  Musculoskeletal: She exhibits no edema and no tenderness.  No joint swelling/effusion of large joints.   Neurological: She is alert.  Skin: Skin is warm and dry.  No concerning rash/acute appearing skin changes  Psychiatric: She has a normal mood and affect. Her behavior is normal. Thought content normal.    ED Course  Procedures (including critical care time) Labs Review Labs Reviewed - No data to display  Imaging Review No results found.   EKG  Interpretation None      MDM   Final diagnoses:  Bilateral back pain, unspecified location  Bilateral shoulder pain    89yF with multiple pain complaints, but primarily shoulders and back. Seem chronic per pt and confirmed by family at bedside. No recent trauma. No respiratory complaints. Feels much better with meds in ED. Does not sound like getting prescribed pain meds at home consistently. Reviewed dosing/frequency with family. I feel she is appropriate for discharge at this time.     Virgel Manifold, MD 06/21/14 [place], MD 06/26/14 [phone]

## 2014-06-24 ENCOUNTER — Ambulatory Visit (INDEPENDENT_AMBULATORY_CARE_PROVIDER_SITE_OTHER): Payer: Medicare Other | Admitting: Family Medicine

## 2014-06-24 ENCOUNTER — Encounter: Payer: Self-pay | Admitting: Family Medicine

## 2014-06-24 VITALS — BP 142/67 | HR 81 | Temp 98.0°F | Wt 120.0 lb

## 2014-06-24 DIAGNOSIS — R5383 Other fatigue: Secondary | ICD-10-CM | POA: Diagnosis not present

## 2014-06-24 DIAGNOSIS — R269 Unspecified abnormalities of gait and mobility: Secondary | ICD-10-CM | POA: Diagnosis not present

## 2014-06-24 DIAGNOSIS — I739 Peripheral vascular disease, unspecified: Secondary | ICD-10-CM

## 2014-06-24 DIAGNOSIS — M79609 Pain in unspecified limb: Secondary | ICD-10-CM | POA: Diagnosis not present

## 2014-06-24 DIAGNOSIS — Z9181 History of falling: Secondary | ICD-10-CM | POA: Diagnosis not present

## 2014-06-24 DIAGNOSIS — Z85038 Personal history of other malignant neoplasm of large intestine: Secondary | ICD-10-CM | POA: Diagnosis not present

## 2014-06-24 DIAGNOSIS — Z853 Personal history of malignant neoplasm of breast: Secondary | ICD-10-CM | POA: Diagnosis not present

## 2014-06-24 DIAGNOSIS — I359 Nonrheumatic aortic valve disorder, unspecified: Secondary | ICD-10-CM | POA: Diagnosis not present

## 2014-06-24 DIAGNOSIS — M129 Arthropathy, unspecified: Secondary | ICD-10-CM

## 2014-06-24 DIAGNOSIS — E119 Type 2 diabetes mellitus without complications: Secondary | ICD-10-CM

## 2014-06-24 DIAGNOSIS — D509 Iron deficiency anemia, unspecified: Secondary | ICD-10-CM | POA: Diagnosis not present

## 2014-06-24 DIAGNOSIS — M546 Pain in thoracic spine: Secondary | ICD-10-CM | POA: Diagnosis not present

## 2014-06-24 DIAGNOSIS — F17200 Nicotine dependence, unspecified, uncomplicated: Secondary | ICD-10-CM | POA: Diagnosis not present

## 2014-06-24 DIAGNOSIS — R5381 Other malaise: Secondary | ICD-10-CM | POA: Diagnosis not present

## 2014-06-24 DIAGNOSIS — IMO0001 Reserved for inherently not codable concepts without codable children: Secondary | ICD-10-CM | POA: Diagnosis not present

## 2014-06-24 DIAGNOSIS — M25559 Pain in unspecified hip: Secondary | ICD-10-CM | POA: Diagnosis not present

## 2014-06-24 MED ORDER — COLCHICINE 0.6 MG PO TABS: 0.6000 mg | ORAL_TABLET | Freq: Two times a day (BID) | ORAL | Status: DC

## 2014-06-24 MED ORDER — ACETAMINOPHEN 500 MG PO TABS: 500.0000 mg | ORAL_TABLET | Freq: Four times a day (QID) | ORAL | Status: DC | PRN

## 2014-06-24 NOTE — Assessment & Plan Note (Signed)
Stable. Patient last saw vascular surgery in 03/2014 and they noted: ABIs of 0.51 on the right and 0.62 on the left, all with monophasic waveforms. The patient does have right superficial femoral and popliteal lesion as well as left superficial femoral lesion. She has severe tibial disease. - Symptoms stable - Re-ordered referral for [facility]

## 2014-06-24 NOTE — Assessment & Plan Note (Signed)
HbA1c 7.7 in 01/2014. Will continue Metformin 500mg  BID - Repeat A1c in 3 months - If >8, will increase Metformin to 1000mg  BID

## 2014-06-24 NOTE — Patient Instructions (Signed)
It was very nice meeting you. For your leg pain, you can take Tylenol or Vicodin, please do not take them both Continue to take Metformin 500mg  twice daily.  I will recheck labs in 3 months and decide if we need to increase the Metformin I made you another referral for [place]. Please return to clinic in 3 months for a follow up  Peripheral Neuropathy Peripheral neuropathy is a type of nerve damage. It affects nerves that carry signals between the spinal cord and other parts of the body. These are called peripheral nerves. With peripheral neuropathy, one nerve or a group of nerves may be damaged.  CAUSES  Many things can damage peripheral nerves. For some people with peripheral neuropathy, the cause is unknown. Some causes include:  Diabetes. This is the most common cause of peripheral neuropathy.  Injury to a nerve.  Pressure or stress on a nerve that lasts a long time.  Too little vitamin B. Alcoholism can lead to this.  Infections.  Autoimmune diseases, such as multiple sclerosis and systemic lupus erythematosus.  Inherited nerve diseases.  Some medicines, such as cancer drugs.  Toxic substances, such as lead and mercury.  Too little blood flowing to the legs.  Kidney disease.  Thyroid disease. SIGNS AND SYMPTOMS  Different people have different symptoms. The symptoms you have will depend on which of your nerves is damaged. Common symptoms include:  Loss of feeling (numbness) in the feet and hands.  Tingling in the feet and hands.  Pain that burns.  Very sensitive skin.  Weakness.  Not being able to move a part of the body (paralysis).  Muscle twitching.  Clumsiness or poor coordination.  Loss of balance.  Not being able to control your bladder.  Feeling dizzy.  Sexual problems. DIAGNOSIS  Peripheral neuropathy is a symptom, not a disease. Finding the cause of peripheral neuropathy can be hard. To figure that out, your health care provider  will take a medical history and do a physical exam. A neurological exam will also be done. This involves checking things affected by your brain, spinal cord, and nerves (nervous system). For example, your health care provider will check your reflexes, how you move, and what you can feel.  Other types of tests may also be ordered, such as:  Blood tests.  A test of the fluid in your spinal cord.  Imaging tests, such as CT scans or an MRI.  Electromyography (EMG). This test checks the nerves that control muscles.  Nerve conduction velocity tests. These tests check how fast messages pass through your nerves.  Nerve biopsy. A small piece of nerve is removed. It is then checked under a microscope. TREATMENT   Medicine is often used to treat peripheral neuropathy. Medicines may include:  Pain-relieving medicines. Prescription or over-the-counter medicine may be suggested.  Antiseizure medicine. This may be used for pain.  Antidepressants. These also may help ease pain from neuropathy.  Lidocaine. This is a numbing medicine. You might wear a patch or be given a shot.  Mexiletine. This medicine is typically used to help control irregular heart rhythms.  Surgery. Surgery may be needed to relieve pressure on a nerve or to destroy a nerve that is causing pain.  Physical therapy to help movement.  Assistive devices to help movement. HOME CARE INSTRUCTIONS   Only take over-the-counter or prescription medicines as directed by your health care provider. Follow the instructions carefully for any given medicines. Do not take any other medicines without  first getting approval from your health care provider.  If you have diabetes, work closely with your health care provider to keep your blood sugar under control.  If you have numbness in your feet:  Check every day for signs of injury or infection. Watch for redness, warmth, and swelling.  Wear padded socks and comfortable shoes. These help  protect your feet.  Do not do things that put pressure on your damaged nerve.  Do not smoke. Smoking keeps blood from getting to damaged nerves.  Avoid or limit alcohol. Too much alcohol can cause a lack of B vitamins. These vitamins are needed for healthy nerves.  Develop a good support system. Coping with peripheral neuropathy can be stressful. Talk to a mental health specialist or join a support group if you are struggling.  Follow up with your health care provider as directed. SEEK MEDICAL CARE IF:   You have new signs or symptoms of peripheral neuropathy.  You are struggling emotionally from dealing with peripheral neuropathy.  You have a fever. SEEK IMMEDIATE MEDICAL CARE IF:   You have an injury or infection that is not healing.  You feel very dizzy or begin vomiting.  You have chest pain.  You have trouble breathing. Document Released: 10/20/2002 Document Revised: 07/12/2011 Document Reviewed: 07/07/2013 [facility] Patient Information 2015 [place], [place]. This information is not intended to replace advice given to you by your health care provider. Make sure you discuss any questions you have with your health care provider.

## 2014-06-24 NOTE — Progress Notes (Signed)
[facility]  [name], MD  Subjective:  Chief complaint: Meet new MD  Ms. Chopp is a 80 y/o female with a PMH of breast cancer (on Tamoxifen), colon cancer, T2DM, peripheral neuropathy, CAD, PAD, and CHF presenting to meet her new doctor.  She tells me she's been doing pretty well and has no complaints. She went to the ED on 8/1 due to pain in her shoulders (L>R) and back which was chronic. Was given morphine at that time with some improvement. Of note, she endorsed thoracic back pain to her oncologist on 7/28 and was given a one month prescription for presumed degenerative changes. At that time, the MD was worried as she also has hypercalcemia as well.  He ordered SPEP and additional labs that have not been collected yet. She tells me here back pain is stable currently. No radiating to her legs or arms.  She endorses a heaviness in her legs (they feel like "lead"). This heaviness is a chronic issue and is currently stable. She is seen by vascular surgery as well for her PAD. She denies any falls  DM: Patient currently taking Metformin 500mg  BID without GI upset. No longer watching her diet as her doctor said it was fine for her to eat whatever she wanted. The tingling in her feet is stable on Neurontin   ROS- No urinary or bowel incontinence. No dysuria or urinary urgency.   Past Medical History Patient Active Problem List   Diagnosis Date Noted  . Hypercalcemia 06/09/2014  . Breast cancer, left breast 06/09/2014  . Breast cancer, right breast 06/09/2014  . Edema of left lower extremity 03/30/2014  . PAD (peripheral artery disease) 03/30/2014  . Colon cancer 12/11/2013  . Neck pain 10/22/2013  . Chest pain, unspecified 09/19/2013  . Bilateral neck pain 09/19/2013  . Light headed 06/16/2013  . History of fall 05/15/2013  . Right thigh pain 05/15/2013  . Memory deficit 05/15/2013  . Bilateral leg pain 03/07/2013  . Trigger thumb of right hand 03/07/2013  .  Lower extremity edema 02/19/2013  . Weakness generalized 01/22/2013  . Stricture and stenosis of esophagus 10/27/2012  . Food impaction of esophagus 10/27/2012  . Gout attack 09/13/2012  . Poor social situation 09/13/2012  . Low back pain 08/07/2012  . Musculoskeletal chest pain 07/10/2012  . Thoracic back pain 05/24/2012  . Weight loss 04/03/2012  . Insomnia 09/25/2011  . Urinary frequency 06/02/2011  . Tobacco abuse 06/02/2011  . History of breast cancer in female, bilateral.  Mastectomies 10/24/2010. 04/26/2011  . Hiatal hernia 03/22/2011  . Colon polyp 03/22/2011  . Poor circulation 03/22/2011  . Other malaise and fatigue 03/22/2011  . 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 NEUROPATHY 08/25/2009  . ANEMIA-IRON DEFICIENCY 04/29/2007  . HYPERLIPIDEMIA 01/29/2007  . DIABETES MELLITUS II, UNCOMPLICATED 25/42/7062  . HYPERTENSION, BENIGN SYSTEMIC 01/10/2007  . CORONARY, ARTERIOSCLEROSIS 01/10/2007  . Chronic systolic heart failure 37/62/8315  . ASTHMA, PERSISTENT 01/10/2007  . Reflux esophagitis 01/10/2007  . ARTHRITIS 01/10/2007    Medications- reviewed and updated Current Outpatient Prescriptions  Medication Sig Dispense Refill  . albuterol (PROVENTIL HFA;VENTOLIN HFA) 108 (90 BASE) MCG/ACT inhaler Inhale 2 puffs into the lungs every 6 (six) hours as needed. For shortness of breath  1 Inhaler  6  . amLODipine (NORVASC) 10 MG tablet Take 1 tablet (10 mg total) by mouth daily.  30 tablet  3  . aspirin (ASPIRIN CHILDRENS) 81 MG chewable tablet Chew 81 mg by mouth daily.        Marland Kitchen  colchicine 0.6 MG tablet Take 1 tablet (0.6 mg total) by mouth 2 (two) times daily.  60 tablet  5  . furosemide (LASIX) 20 MG tablet Take 1 tablet (20 mg total) by mouth daily.  30 tablet  3  . gabapentin (NEURONTIN) 300 MG capsule Take 1 capsule (300 mg total) by mouth 2 (two) times daily.  60 capsule  0  . HYDROcodone-acetaminophen (NORCO/VICODIN) 5-325 MG per tablet Take 1 tablet by mouth every  6 (six) hours as needed for moderate pain.  30 tablet  0  . losartan (COZAAR) 50 MG tablet Take 50 mg by mouth daily.      . magnesium hydroxide (MILK OF MAGNESIA) 400 MG/5ML suspension Take 30 mLs by mouth daily as needed for mild constipation.      . metFORMIN (GLUCOPHAGE) 500 MG tablet Take 500 mg by mouth 2 (two) times daily with a meal.      . mirtazapine (REMERON) 15 MG tablet Take 15 mg by mouth at bedtime as needed (sleep).      Marland Kitchen PATADAY 0.2 % SOLN Place 1 drop into both eyes daily as needed. For itchy eyes      . potassium chloride (K-DUR) 10 MEQ tablet Take 1 tablet (10 mEq total) by mouth daily. Take with the lasix  30 tablet  0  . ranitidine (ZANTAC) 150 MG capsule Take 1 capsule (150 mg total) by mouth 2 (two) times daily as needed for heartburn.  60 capsule  3  . rosuvastatin (CRESTOR) 20 MG tablet Take 20 mg by mouth daily.      . tamoxifen (NOLVADEX) 20 MG tablet Take 1 tablet (20 mg total) by mouth daily.  30 tablet  4  . acetaminophen (TYLENOL) 500 MG tablet Take 1 tablet (500 mg total) by mouth every 6 (six) hours as needed.  30 tablet  0  . [DISCONTINUED] cetirizine (ZYRTEC) 5 MG tablet Take 1 tablet (5 mg total) by mouth daily. Take at night time as it can cause some drowsiness  30 tablet  4   No current facility-administered medications for this visit.   Patient has used snuff since she was 11. Lives with her son who helps her out, however she is able to perform all of her ADLs.   Objective: BP 142/67  Pulse 81  Temp(Src) 98 F (36.7 C) (Oral)  Wt 120 lb (54.432 kg) Gen: No acute distress. Alert, pleasant and cooperative with exam HEENT: Atraumatic. Oropharynx clear. MMM CV: RRR. No murmurs, rubs, or gallops noted. 1+ radial and DP pulses bilaterally. Resp: CTAB. No wheezing, crackles, or rhonchi noted. Abd: +BS. Soft, non-distended, non-tender. No rebound or guarding.  Ext: 1+ pitting edema b/l. Some enlargement at the lateral malleoli b/l with no tenderness or  erythema.  Neuro: Alert and oriented, Decreased sensation in the feet b/l with monofilament test, more severe on the R>L. Ambulates with a rolling walker.    Assessment/Plan:  DIABETES MELLITUS II, UNCOMPLICATED [id number] 7.7 in 01/2014. Will continue Metformin 500mg  BID - Repeat A1c in 3 months - If >8, will increase Metformin to 1000mg  BID  Thoracic back pain Patient has chronic back pain. Xray in 03/2014 showed thoracic scoliosis with multilevel degenerative disc disease in the mid thoracic spine. No red flags in history or on exam.  -Patient recently received a Rx for Vicodin from her oncologist on 7/28 - Suggested the patient use Tylenol to see if this was effective. - If no improvement with Tylenol and patient runs out  of Vicodin, I asked her to come back in for re-evaluation and I could try to prescribe her something else.   PAD (peripheral artery disease) Stable. Patient last saw vascular surgery in 03/2014 and they noted: ABIs of 0.51 on the right and 0.62 on the left, all with monophasic waveforms. The patient does have right superficial femoral and popliteal lesion as well as left superficial femoral lesion. She has severe tibial disease. - Symptoms stable - Re-ordered referral for [place]     Orders Placed This Encounter  Procedures  . Ambulatory referral to Podiatry    Referral Priority:  Routine    Referral Type:  Consultation    Referral Reason:  Specialty Services Required    Requested Specialty:  Podiatry    Number of Visits Requested:  1    Meds ordered this encounter  Medications  . acetaminophen (TYLENOL) 500 MG tablet    Sig: Take 1 tablet (500 mg total) by mouth every 6 (six) hours as needed.    Dispense:  30 tablet    Refill:  0  . colchicine 0.6 MG tablet    Sig: Take 1 tablet (0.6 mg total) by mouth 2 (two) times daily.    Dispense:  60 tablet    Refill:  5

## 2014-06-24 NOTE — Assessment & Plan Note (Signed)
Patient has chronic back pain. Xray in 03/2014 showed thoracic scoliosis with multilevel degenerative disc disease in the mid thoracic spine. No red flags in history or on exam.  -Patient recently received a Rx for Vicodin from her oncologist on 7/28 - Suggested the patient use Tylenol to see if this was effective. - If no improvement with Tylenol and patient runs out of Vicodin, I asked her to come back in for re-evaluation and I could try to prescribe her something else.

## 2014-06-29 ENCOUNTER — Encounter: Payer: Self-pay | Admitting: Podiatry

## 2014-06-29 ENCOUNTER — Ambulatory Visit (INDEPENDENT_AMBULATORY_CARE_PROVIDER_SITE_OTHER): Payer: Medicare Other | Admitting: Podiatry

## 2014-06-29 VITALS — BP 182/73 | HR 83 | Resp 16 | Ht 59.0 in | Wt 125.0 lb

## 2014-06-29 DIAGNOSIS — E1149 Type 2 diabetes mellitus with other diabetic neurological complication: Secondary | ICD-10-CM | POA: Diagnosis not present

## 2014-06-29 DIAGNOSIS — M79609 Pain in unspecified limb: Secondary | ICD-10-CM

## 2014-06-29 DIAGNOSIS — M79673 Pain in unspecified foot: Secondary | ICD-10-CM

## 2014-06-29 NOTE — Patient Instructions (Signed)
Diabetes and Foot Care Diabetes may cause you to have problems because of poor blood supply (circulation) to your feet and legs. This may cause the skin on your feet to become thinner, break easier, and heal more slowly. Your skin may become dry, and the skin may peel and crack. You may also have nerve damage in your legs and feet causing decreased feeling in them. You may not notice minor injuries to your feet that could lead to infections or more serious problems. Taking care of your feet is one of the most important things you can do for yourself.  HOME CARE INSTRUCTIONS  Wear shoes at all times, even in the house. Do not go barefoot. Bare feet are easily injured.  Check your feet daily for blisters, cuts, and redness. If you cannot see the bottom of your feet, use a mirror or ask someone for help.  Wash your feet with warm water (do not use hot water) and mild soap. Then pat your feet and the areas between your toes until they are completely dry. Do not soak your feet as this can dry your skin.  Apply a moisturizing lotion or petroleum jelly (that does not contain alcohol and is unscented) to the skin on your feet and to dry, brittle toenails. Do not apply lotion between your toes.  Trim your toenails straight across. Do not dig under them or around the cuticle. File the edges of your nails with an emery board or nail file.  Do not cut corns or calluses or try to remove them with medicine.  Wear clean socks or stockings every day. Make sure they are not too tight. Do not wear knee-high stockings since they may decrease blood flow to your legs.  Wear shoes that fit properly and have enough cushioning. To break in new shoes, wear them for just a few hours a day. This prevents you from injuring your feet. Always look in your shoes before you put them on to be sure there are no objects inside.  Do not cross your legs. This may decrease the blood flow to your feet.  If you find a minor scrape,  cut, or break in the skin on your feet, keep it and the skin around it clean and dry. These areas may be cleansed with mild soap and water. Do not cleanse the area with peroxide, alcohol, or iodine.  When you remove an adhesive bandage, be sure not to damage the skin around it.  If you have a wound, look at it several times a day to make sure it is healing.  Do not use heating pads or hot water bottles. They may burn your skin. If you have lost feeling in your feet or legs, you may not know it is happening until it is too late.  Make sure your health care provider performs a complete foot exam at least annually or more often if you have foot problems. Report any cuts, sores, or bruises to your health care provider immediately. SEEK MEDICAL CARE IF:   You have an injury that is not healing.  You have cuts or breaks in the skin.  You have an ingrown nail.  You notice redness on your legs or feet.  You feel burning or tingling in your legs or feet.  You have pain or cramps in your legs and feet.  Your legs or feet are numb.  Your feet always feel cold. SEEK IMMEDIATE MEDICAL CARE IF:   There is increasing redness,   swelling, or pain in or around a wound.  There is a red line that goes up your leg.  Pus is coming from a wound.  You develop a fever or as directed by your health care provider.  You notice a bad smell coming from an ulcer or wound. Document Released: 10/27/2000 Document Revised: 07/02/2013 Document Reviewed: 04/08/2013 [employer] Patient Information 2015 [employer]. This information is not intended to replace advice given to you by your health care provider. Make sure you discuss any questions you have with your health care provider.  

## 2014-06-29 NOTE — Progress Notes (Signed)
   Subjective:    Patient ID: [name], female    DOB: [birth year], 80 y.o.   MRN: [id number]  HPI Comments: N edema L left foot and ankle and slight left D 2 months O gradually worsening C swelling and heavy sensation, hypertensive pt A weightbearing T Dr. Lorenso Courier referred  This patient presents with a long history of bilateral leg edema and has been evaluated by vascular surgeon this year with a diagnosis of peripheral arterial disease and neuropathy. The swelling has not increased over time. The patient describes decreased pain since taking gabapentin.   Review of Systems  All other systems reviewed and are negative.      Objective:   Physical Exam Orientated x3 black female presents with her daughter  Vascular: DP and PT pulses 1/4 bilaterally Bilateral pitting edema ankles/feet left greater than right tenderness bilaterally  Dermatological: Texture and turgor within normal limits No skin lesions noted bilaterally No erythema, warmth noted bilaterally The toenails are elongated and incurvated and discolored 6-10  Neurological: Ankle reflexes equal and reactive bilaterally Vibratory sensation on reactive bilaterally Sensation to 10 g monofilament wire intact 5/5 bilaterally  Musculoskeletal: No restriction ankle, subtalar, midtarsal joints bilaterally       Assessment & Plan:   Assessment: Edema bilaterally Diabetic peripheral neuropathy bilaterally most likely causing patient's pain Peripheral arterial disease bilaterally  Plan: Maintain gabapentin as prescribed Return as needed for debridement of toenails

## 2014-06-30 ENCOUNTER — Encounter: Payer: Self-pay | Admitting: Podiatry

## 2014-07-10 ENCOUNTER — Ambulatory Visit (INDEPENDENT_AMBULATORY_CARE_PROVIDER_SITE_OTHER): Payer: Medicare Other | Admitting: Family Medicine

## 2014-07-10 ENCOUNTER — Other Ambulatory Visit: Payer: Self-pay | Admitting: *Deleted

## 2014-07-10 ENCOUNTER — Telehealth: Payer: Self-pay | Admitting: *Deleted

## 2014-07-10 VITALS — BP 116/65 | HR 80 | Temp 98.6°F | Wt 122.0 lb

## 2014-07-10 DIAGNOSIS — M129 Arthropathy, unspecified: Secondary | ICD-10-CM | POA: Diagnosis not present

## 2014-07-10 DIAGNOSIS — M25559 Pain in unspecified hip: Secondary | ICD-10-CM | POA: Diagnosis not present

## 2014-07-10 DIAGNOSIS — M79609 Pain in unspecified limb: Secondary | ICD-10-CM | POA: Diagnosis not present

## 2014-07-10 DIAGNOSIS — R5383 Other fatigue: Secondary | ICD-10-CM | POA: Diagnosis not present

## 2014-07-10 DIAGNOSIS — R5381 Other malaise: Secondary | ICD-10-CM | POA: Diagnosis not present

## 2014-07-10 DIAGNOSIS — Z9181 History of falling: Secondary | ICD-10-CM | POA: Diagnosis not present

## 2014-07-10 DIAGNOSIS — IMO0001 Reserved for inherently not codable concepts without codable children: Secondary | ICD-10-CM | POA: Diagnosis not present

## 2014-07-10 DIAGNOSIS — F17200 Nicotine dependence, unspecified, uncomplicated: Secondary | ICD-10-CM | POA: Diagnosis not present

## 2014-07-10 DIAGNOSIS — R0789 Other chest pain: Secondary | ICD-10-CM

## 2014-07-10 DIAGNOSIS — I359 Nonrheumatic aortic valve disorder, unspecified: Secondary | ICD-10-CM | POA: Diagnosis not present

## 2014-07-10 DIAGNOSIS — E119 Type 2 diabetes mellitus without complications: Secondary | ICD-10-CM | POA: Diagnosis not present

## 2014-07-10 DIAGNOSIS — R269 Unspecified abnormalities of gait and mobility: Secondary | ICD-10-CM | POA: Diagnosis not present

## 2014-07-10 MED ORDER — MELOXICAM 7.5 MG PO TABS: 7.5000 mg | ORAL_TABLET | Freq: Every day | ORAL | Status: DC

## 2014-07-10 MED ORDER — CARVEDILOL 12.5 MG PO TABS: 12.5000 mg | ORAL_TABLET | Freq: Two times a day (BID) | ORAL | Status: DC

## 2014-07-10 MED ORDER — ACETAMINOPHEN 500 MG PO TABS: 500.0000 mg | ORAL_TABLET | Freq: Three times a day (TID) | ORAL | Status: DC | PRN

## 2014-07-10 NOTE — Patient Instructions (Addendum)
Thank you for coming to see me today. It was a pleasure. Today we talked about:   Chest pain: this seems to be related to muscles and bones. I am prescribing Tylenol, which you said has helped, but at a higher dose. I will also prescribe an antiinflammatory medication called Mobic that you can take for the next two weeks.  If you develop chest pain with shortness of breath, palpitations, lightheadedness, jaw or left arm pain, cough and fever, please return to be evaluated.  Please make an appointment to see Dr. Lorenso Courier in 4 weeks for follow-up.  If you have any questions or concerns, please do not hesitate to call the office at [phone].  Sincerely,  Cordelia Poche, MD

## 2014-07-10 NOTE — Telephone Encounter (Signed)
Refilled Rx for Coreg.

## 2014-07-10 NOTE — Assessment & Plan Note (Addendum)
Patient came with daughter today. Daughter very upset that I would not refill narcotic. Pain is most likely musculoskeletal. Do not suspect cardiac or respiratory etiology. Will treat with short course of Mobic with increase to tylenol 500mg -1000mg  q8 hours prn. Patient's last creatinine was borderline at 1.1.  Will not prescribe narcotics because, (1) I don't think it is appropriate to treat this type of pain with narcotics, (2) she is getting this medication from another provider and (3), there is no recent documentation that she has been receiving refills here. She will follow-up with PCP and states she has an appointment next week.

## 2014-07-10 NOTE — Progress Notes (Signed)
-     Subjective   [name] is a 80 y.o. female that presents for a same day visit  1. Chest pain: Left sided chest pain. S/p bilateral mastectomy in 2012 and has had intermittent chest pain that is chronic. Current episode started yesterday with sharp, non-exertional and non-radiating chest pain. No shortness of breath. No palpitations. No lightheadedness. No nausea or vomiting. No cough and no fever. She states Vicodin helps with the pain. She states she has been receiving this prescription at this office and received her last refill at her last office visit.  History  Substance Use Topics  . Smoking status: Never Smoker   . Smokeless tobacco: Current User    Types: Snuff  . Alcohol Use: No    ROS Per HPI  Objective   There were no vitals taken for this visit.  General: Well appearing, no acute distress Respiratory/Chest: Patient is s/p double mastectomy. Lungs cllear to auscultation bilaterally, no wheezes or diminished breath sounds. Reproducible chest pain Cardiovascular: Regular rate and rhythm, 2/6 systolic murmur heard Neuro: Alert and oriented  Assessment and Plan   Please refer to problem based charting of assessment and plan

## 2014-07-10 NOTE — Telephone Encounter (Signed)
Pt's daughter called to request refill on vicodin for pt's pain.  Informed her Dr. Alvy Bimler gave a one time Rx for pain medication but this needs to be refilled and managed ongoing by pt's PCP.  She verbalized understanding.

## 2014-07-21 ENCOUNTER — Other Ambulatory Visit: Payer: Self-pay | Admitting: *Deleted

## 2014-07-21 DIAGNOSIS — I5032 Chronic diastolic (congestive) heart failure: Secondary | ICD-10-CM

## 2014-07-21 MED ORDER — AMLODIPINE BESYLATE 10 MG PO TABS: 10.0000 mg | ORAL_TABLET | Freq: Every day | ORAL | Status: DC

## 2014-07-21 MED ORDER — FUROSEMIDE 20 MG PO TABS: 20.0000 mg | ORAL_TABLET | Freq: Every day | ORAL | Status: DC

## 2014-07-21 NOTE — Telephone Encounter (Signed)
Please let Ms. Burson know that I refilled her Rxs.  Thanks, [employer]

## 2014-07-22 NOTE — Telephone Encounter (Signed)
Pt informed that her RXs were filled. Ansel Ferrall CMA

## 2014-08-06 ENCOUNTER — Other Ambulatory Visit: Payer: Self-pay | Admitting: Surgery

## 2014-08-10 ENCOUNTER — Encounter: Payer: Self-pay | Admitting: Family Medicine

## 2014-08-10 ENCOUNTER — Ambulatory Visit (INDEPENDENT_AMBULATORY_CARE_PROVIDER_SITE_OTHER): Payer: Medicare Other | Admitting: Family Medicine

## 2014-08-10 ENCOUNTER — Ambulatory Visit: Payer: Medicare Other | Admitting: Family Medicine

## 2014-08-10 VITALS — Temp 97.7°F | Ht 59.0 in | Wt 123.4 lb

## 2014-08-10 DIAGNOSIS — M546 Pain in thoracic spine: Secondary | ICD-10-CM

## 2014-08-10 DIAGNOSIS — F17200 Nicotine dependence, unspecified, uncomplicated: Secondary | ICD-10-CM | POA: Diagnosis not present

## 2014-08-10 DIAGNOSIS — M25559 Pain in unspecified hip: Secondary | ICD-10-CM | POA: Diagnosis not present

## 2014-08-10 DIAGNOSIS — Z9181 History of falling: Secondary | ICD-10-CM | POA: Diagnosis not present

## 2014-08-10 DIAGNOSIS — E119 Type 2 diabetes mellitus without complications: Secondary | ICD-10-CM | POA: Diagnosis not present

## 2014-08-10 DIAGNOSIS — R609 Edema, unspecified: Secondary | ICD-10-CM

## 2014-08-10 DIAGNOSIS — R600 Localized edema: Secondary | ICD-10-CM

## 2014-08-10 DIAGNOSIS — M25579 Pain in unspecified ankle and joints of unspecified foot: Secondary | ICD-10-CM

## 2014-08-10 DIAGNOSIS — R269 Unspecified abnormalities of gait and mobility: Secondary | ICD-10-CM | POA: Diagnosis not present

## 2014-08-10 DIAGNOSIS — R5383 Other fatigue: Secondary | ICD-10-CM | POA: Diagnosis not present

## 2014-08-10 DIAGNOSIS — R5381 Other malaise: Secondary | ICD-10-CM | POA: Diagnosis not present

## 2014-08-10 DIAGNOSIS — M79609 Pain in unspecified limb: Secondary | ICD-10-CM | POA: Diagnosis not present

## 2014-08-10 DIAGNOSIS — I359 Nonrheumatic aortic valve disorder, unspecified: Secondary | ICD-10-CM | POA: Diagnosis not present

## 2014-08-10 DIAGNOSIS — M25571 Pain in right ankle and joints of right foot: Secondary | ICD-10-CM

## 2014-08-10 DIAGNOSIS — IMO0001 Reserved for inherently not codable concepts without codable children: Secondary | ICD-10-CM | POA: Diagnosis not present

## 2014-08-10 MED ORDER — DICLOFENAC SODIUM 1 % TD GEL
2.0000 g | Freq: Four times a day (QID) | TRANSDERMAL | Status: DC
Start: 2014-08-10 — End: 2015-02-14

## 2014-08-10 MED ORDER — TRAMADOL HCL 50 MG PO TABS: 50.0000 mg | ORAL_TABLET | Freq: Three times a day (TID) | ORAL | Status: DC | PRN

## 2014-08-10 NOTE — Patient Instructions (Signed)
Great to meet you today!  Try the tramadol for your pain, also use the gel on your ankle up tp 4 times a day.  Come back in 2 weeks to see your PCP

## 2014-08-10 NOTE — Assessment & Plan Note (Signed)
Seems consistent with previous exams that are documented Volume status overall looks euvolemic Still with good urinary response to lasix and no orthopnea.   Recommend elevating feet and close follow up with PCP, advised low threshold for return if dyspnea develops.

## 2014-08-10 NOTE — Progress Notes (Signed)
Patient ID: [name], female   DOB: 07-21-1924, 80 y.o.   MRN: [id number]   HPI  Patient presents today for evaluation of right ankle pain  Patient complains of new onset right ankle pain and worsening bilateral lower extremity edema over the last day. She states that the ankle pain is achy 8/10 pain across the anterior portion of her ankle joint without alleviating or aggravating factors. She states that the pain does not limit her activity at all.  She also notes that she has right hip pain described as sharp right low back which is very similar to the pain which she had an injection for about 5 years ago. She states it hurts worse with palpation and does not hurt worse with walking.  She denies orthopnea and continues to sleep on one pillow at night She denies fevers, chills, sweats, loss of appetite, chest pain, dyspnea, and abdominal pain.  We reviewed her medicines and they reconciled in detail, she still has good urinary response to Lasix everyday.  Smoking status noted ROS: Per HPI  Objective: Temp(Src) 97.7 F (36.5 C) (Oral)  Ht 4\' 11"  (1.499 m)  Wt 123 lb 6.4 oz (55.974 kg)  BMI 24.91 kg/m2 Gen: NAD, alert, cooperative with exam HEENT: NCAT CV: RRR, good S1/S2, no murmur Resp: CTABL, no wheezes, non-labored Ext: 1+ pitting edema bilateral lower extremities, right ankle with slight tenderness to palpation, range of motion slightly limited on extension/dorsi flexion, no erythema or warmth Neuro: Alert and conversational, walks easily with a 4 point cane, no gross deficits  Assessment and plan:  Right ankle pain Right ankle pain seems to most likely musculoskeletal exam.  No trauma, last film of other ankle without significant OA Treat with tramadol and both pharyngeal Tramadol also given for her back pain, which was evaluated previously and felt to be lumbar OA Discussed at length the advantages of using tramadol instead of Norco, especially given her  age Discussed not to use any NSAIDs considering her age Consider Gout, however this would be a very unusual presentation.  Follow up 2-3 weeks with PCP  Thoracic back pain Agree with Dr. Manson Passey, Appears to be Lumbar/sacral OA Pain today seems slightly different and is at least suspicious for SI joint inflammation.  Discussed trial of tramadol as she is an 80 year old female with a cane and a fall in July of this year.     Lower extremity edema Seems consistent with previous exams that are documented Volume status overall looks euvolemic Still with good urinary response to lasix and no orthopnea.   Recommend elevating feet and close follow up with PCP, advised low threshold for return if dyspnea develops.      Orders Placed This Encounter  Procedures  . CBC with Differential  . Comprehensive metabolic panel    Meds ordered this encounter  Medications  . diclofenac sodium (VOLTAREN) 1 % GEL    Sig: Apply 2 g topically 4 (four) times daily.    Dispense:  100 g    Refill:  3  . traMADol (ULTRAM) 50 MG tablet    Sig: Take 1-2 tablets (50-100 mg total) by mouth every 8 (eight) hours as needed.    Dispense:  30 tablet    Refill:  0

## 2014-08-10 NOTE — Assessment & Plan Note (Signed)
Agree with Dr. Manson Passey, Appears to be Lumbar/sacral OA Pain today seems slightly different and is at least suspicious for SI joint inflammation.  Discussed trial of tramadol as she is an 80 year old female with a cane and a fall in July of this year.

## 2014-08-10 NOTE — Assessment & Plan Note (Addendum)
Right ankle pain seems to most likely musculoskeletal exam.  No trauma, last film of other ankle without significant OA Treat with tramadol and both pharyngeal Tramadol also given for her back pain, which was evaluated previously and felt to be lumbar OA Discussed at length the advantages of using tramadol instead of Norco, especially given her age Discussed not to use any NSAIDs considering her age Consider Gout, however this would be a very unusual presentation.  Follow up 2-3 weeks with PCP

## 2014-08-11 ENCOUNTER — Telehealth: Payer: Self-pay | Admitting: Family Medicine

## 2014-08-11 ENCOUNTER — Encounter: Payer: Self-pay | Admitting: *Deleted

## 2014-08-11 NOTE — Progress Notes (Signed)
Patient ID: [name], female   DOB: 03-Sep-1924, 80 y.o.   MRN: [id number] Form filled out and placed on RN's desk.   Laroy Apple, MD [place] Resident, PGY-3 08/11/2014, 5:59 PM

## 2014-08-11 NOTE — Telephone Encounter (Signed)
The medication given by Wendi Snipes yesterday is not covered by insurance Please advise

## 2014-08-11 NOTE — Progress Notes (Signed)
Prior Authorization received from [facility] for Voltaren gel. PA form placed in provider box for completion. Derl Barrow, RN

## 2014-08-11 NOTE — Telephone Encounter (Signed)
Pt calls again, PA from Voltaren gel placed in Dr. Alen Bleacher box for completion, Pt will await call about medication.

## 2014-08-11 NOTE — Telephone Encounter (Signed)
Form filled out and placed on RN's desk.   Laroy Apple, MD [place] Resident, PGY-3 08/11/2014, 5:58 PM

## 2014-08-12 NOTE — Progress Notes (Signed)
PA form faxed to [employer] for review.  Candace Begue L, RN  

## 2014-08-13 ENCOUNTER — Telehealth: Payer: Self-pay | Admitting: *Deleted

## 2014-08-13 NOTE — Progress Notes (Signed)
Voltaren Gel approved from [employer] 08/12/2014-08/13/2015. [facility] pharmacy aware of approval.  Derl Barrow, RN

## 2014-08-13 NOTE — Telephone Encounter (Signed)
Pt stated she has been spitting out old food that smells sour.  Pt asked if she was vomiting; she said no, just old food keep coming up.  The dizzy spells are better per pt.  Appt scheduled for tomorrow at 11:30.  Derl Barrow, RN

## 2014-08-13 NOTE — Telephone Encounter (Signed)
Pt would like to speak with RN about having a dizzy spell last night. Samara Snide

## 2014-08-14 ENCOUNTER — Encounter (HOSPITAL_COMMUNITY): Payer: Self-pay | Admitting: General Practice

## 2014-08-14 ENCOUNTER — Ambulatory Visit (INDEPENDENT_AMBULATORY_CARE_PROVIDER_SITE_OTHER): Payer: Medicare Other | Admitting: Family Medicine

## 2014-08-14 ENCOUNTER — Inpatient Hospital Stay (HOSPITAL_COMMUNITY)
Admission: AD | Admit: 2014-08-14 | Discharge: 2014-08-16 | DRG: 690 | Disposition: A | Discharge status: Home / Self Care | Payer: Medicare Other | Source: Ambulatory Visit | Attending: Family Medicine | Admitting: Family Medicine

## 2014-08-14 VITALS — BP 176/82 | HR 98 | Temp 98.2°F | Wt 123.8 lb

## 2014-08-14 DIAGNOSIS — Z85038 Personal history of other malignant neoplasm of large intestine: Secondary | ICD-10-CM | POA: Diagnosis not present

## 2014-08-14 DIAGNOSIS — Z79810 Long term (current) use of selective estrogen receptor modulators (SERMs): Secondary | ICD-10-CM | POA: Diagnosis not present

## 2014-08-14 DIAGNOSIS — E1310 Other specified diabetes mellitus with ketoacidosis without coma: Secondary | ICD-10-CM

## 2014-08-14 DIAGNOSIS — R112 Nausea with vomiting, unspecified: Secondary | ICD-10-CM | POA: Diagnosis present

## 2014-08-14 DIAGNOSIS — N390 Urinary tract infection, site not specified: Secondary | ICD-10-CM | POA: Diagnosis present

## 2014-08-14 DIAGNOSIS — M25551 Pain in right hip: Secondary | ICD-10-CM | POA: Diagnosis present

## 2014-08-14 DIAGNOSIS — R350 Frequency of micturition: Secondary | ICD-10-CM

## 2014-08-14 DIAGNOSIS — K5900 Constipation, unspecified: Secondary | ICD-10-CM | POA: Diagnosis present

## 2014-08-14 DIAGNOSIS — I2510 Atherosclerotic heart disease of native coronary artery without angina pectoris: Secondary | ICD-10-CM | POA: Diagnosis present

## 2014-08-14 DIAGNOSIS — E1110 Type 2 diabetes mellitus with ketoacidosis without coma: Secondary | ICD-10-CM

## 2014-08-14 DIAGNOSIS — F17220 Nicotine dependence, chewing tobacco, uncomplicated: Secondary | ICD-10-CM | POA: Diagnosis present

## 2014-08-14 DIAGNOSIS — D508 Other iron deficiency anemias: Secondary | ICD-10-CM | POA: Diagnosis present

## 2014-08-14 DIAGNOSIS — K2100 Gastro-esophageal reflux disease with esophagitis, without bleeding: Secondary | ICD-10-CM

## 2014-08-14 DIAGNOSIS — G629 Polyneuropathy, unspecified: Secondary | ICD-10-CM | POA: Diagnosis present

## 2014-08-14 DIAGNOSIS — E119 Type 2 diabetes mellitus without complications: Secondary | ICD-10-CM | POA: Diagnosis present

## 2014-08-14 DIAGNOSIS — M1990 Unspecified osteoarthritis, unspecified site: Secondary | ICD-10-CM | POA: Diagnosis present

## 2014-08-14 DIAGNOSIS — Z9013 Acquired absence of bilateral breasts and nipples: Secondary | ICD-10-CM | POA: Diagnosis present

## 2014-08-14 DIAGNOSIS — B349 Viral infection, unspecified: Secondary | ICD-10-CM | POA: Diagnosis present

## 2014-08-14 DIAGNOSIS — I739 Peripheral vascular disease, unspecified: Secondary | ICD-10-CM | POA: Diagnosis present

## 2014-08-14 DIAGNOSIS — Z79899 Other long term (current) drug therapy: Secondary | ICD-10-CM | POA: Diagnosis not present

## 2014-08-14 DIAGNOSIS — I82811 Embolism and thrombosis of superficial veins of right lower extremities: Secondary | ICD-10-CM | POA: Diagnosis present

## 2014-08-14 DIAGNOSIS — A084 Viral intestinal infection, unspecified: Secondary | ICD-10-CM | POA: Diagnosis present

## 2014-08-14 DIAGNOSIS — I252 Old myocardial infarction: Secondary | ICD-10-CM | POA: Diagnosis not present

## 2014-08-14 DIAGNOSIS — M79609 Pain in unspecified limb: Secondary | ICD-10-CM | POA: Diagnosis not present

## 2014-08-14 DIAGNOSIS — E785 Hyperlipidemia, unspecified: Secondary | ICD-10-CM | POA: Diagnosis present

## 2014-08-14 DIAGNOSIS — J45909 Unspecified asthma, uncomplicated: Secondary | ICD-10-CM | POA: Diagnosis present

## 2014-08-14 DIAGNOSIS — B9620 Unspecified Escherichia coli [E. coli] as the cause of diseases classified elsewhere: Secondary | ICD-10-CM | POA: Diagnosis present

## 2014-08-14 DIAGNOSIS — Z96659 Presence of unspecified artificial knee joint: Secondary | ICD-10-CM | POA: Diagnosis present

## 2014-08-14 DIAGNOSIS — K449 Diaphragmatic hernia without obstruction or gangrene: Secondary | ICD-10-CM

## 2014-08-14 DIAGNOSIS — Z66 Do not resuscitate: Secondary | ICD-10-CM | POA: Diagnosis present

## 2014-08-14 DIAGNOSIS — Z7982 Long term (current) use of aspirin: Secondary | ICD-10-CM | POA: Diagnosis not present

## 2014-08-14 DIAGNOSIS — R1110 Vomiting, unspecified: Secondary | ICD-10-CM | POA: Diagnosis not present

## 2014-08-14 DIAGNOSIS — I10 Essential (primary) hypertension: Secondary | ICD-10-CM | POA: Diagnosis present

## 2014-08-14 DIAGNOSIS — Z9181 History of falling: Secondary | ICD-10-CM

## 2014-08-14 DIAGNOSIS — I5022 Chronic systolic (congestive) heart failure: Secondary | ICD-10-CM | POA: Diagnosis present

## 2014-08-14 DIAGNOSIS — R531 Weakness: Secondary | ICD-10-CM

## 2014-08-14 DIAGNOSIS — Z794 Long term (current) use of insulin: Secondary | ICD-10-CM | POA: Diagnosis not present

## 2014-08-14 DIAGNOSIS — Z853 Personal history of malignant neoplasm of breast: Secondary | ICD-10-CM

## 2014-08-14 DIAGNOSIS — R110 Nausea: Secondary | ICD-10-CM | POA: Insufficient documentation

## 2014-08-14 DIAGNOSIS — D509 Iron deficiency anemia, unspecified: Secondary | ICD-10-CM | POA: Diagnosis present

## 2014-08-14 LAB — URINALYSIS, ROUTINE W REFLEX MICROSCOPIC
BILIRUBIN URINE: NEGATIVE
GLUCOSE, UA: 250 mg/dL — AB
HGB URINE DIPSTICK: NEGATIVE
KETONES UR: NEGATIVE mg/dL
Nitrite: NEGATIVE
PH: 7 (ref 5.0–8.0)
Protein, ur: NEGATIVE mg/dL
SPECIFIC GRAVITY, URINE: 1.009 (ref 1.005–1.030)
Urobilinogen, UA: 0.2 mg/dL (ref 0.0–1.0)

## 2014-08-14 LAB — GLUCOSE, CAPILLARY
GLUCOSE-CAPILLARY: 153 mg/dL — AB (ref 70–99)
Glucose-Capillary: 251 mg/dL — ABNORMAL HIGH (ref 70–99)
Glucose-Capillary: 165 mg/dL — ABNORMAL HIGH (ref 70–99)

## 2014-08-14 LAB — CBC
HEMATOCRIT: 33 % — AB (ref 36.0–46.0)
Hemoglobin: 10.9 g/dL — ABNORMAL LOW (ref 12.0–15.0)
MCH: 22.5 pg — ABNORMAL LOW (ref 26.0–34.0)
MCHC: 33 g/dL (ref 30.0–36.0)
MCV: 68 fL — AB (ref 78.0–100.0)
Platelets: 141 10*3/uL — ABNORMAL LOW (ref 150–400)
RBC: 4.85 MIL/uL (ref 3.87–5.11)
RDW: 16.1 % — ABNORMAL HIGH (ref 11.5–15.5)
WBC: 5.7 10*3/uL (ref 4.0–10.5)

## 2014-08-14 LAB — COMPREHENSIVE METABOLIC PANEL
ALBUMIN: 3.6 g/dL (ref 3.5–5.2)
ALT: 5 U/L (ref 0–35)
AST: 11 U/L (ref 0–37)
Alkaline Phosphatase: 46 U/L (ref 39–117)
Anion gap: 14 (ref 5–15)
BUN: 14 mg/dL (ref 6–23)
CALCIUM: 10.3 mg/dL (ref 8.4–10.5)
CO2: 23 mEq/L (ref 19–32)
Chloride: 100 mEq/L (ref 96–112)
Creatinine, Ser: 0.97 mg/dL (ref 0.50–1.10)
GFR calc Af Amer: 59 mL/min — ABNORMAL LOW (ref >90)
GFR, EST NON AFRICAN AMERICAN: 51 mL/min — AB (ref >90)
Glucose, Bld: 205 mg/dL — ABNORMAL HIGH (ref 70–99)
Potassium: 3.9 mEq/L (ref 3.7–5.3)
SODIUM: 137 meq/L (ref 137–147)
Total Bilirubin: 0.3 mg/dL (ref 0.3–1.2)
Total Protein: 8 g/dL (ref 6.0–8.3)

## 2014-08-14 LAB — CK: Total CK: 109 U/L (ref 7–177)

## 2014-08-14 LAB — URINE MICROSCOPIC-ADD ON

## 2014-08-14 MED ORDER — INFLUENZA VAC SPLIT QUAD 0.5 ML IM SUSY
0.5000 mL | PREFILLED_SYRINGE | INTRAMUSCULAR | Status: AC
  Administered 2014-08-16: 0.5 mL via INTRAMUSCULAR
  Filled 2014-08-14: qty 0.5

## 2014-08-14 MED ORDER — ONDANSETRON HCL 4 MG PO TABS: 4.0000 mg | ORAL_TABLET | Freq: Four times a day (QID) | ORAL | Status: DC | PRN

## 2014-08-14 MED ORDER — SENNOSIDES-DOCUSATE SODIUM 8.6-50 MG PO TABS: 1.0000 | ORAL_TABLET | Freq: Every evening | ORAL | Status: DC | PRN

## 2014-08-14 MED ORDER — ONDANSETRON 4 MG PO TBDP
4.0000 mg | ORAL_TABLET | Freq: Once | ORAL | Status: AC
  Administered 2014-08-14: 4 mg via ORAL

## 2014-08-14 MED ORDER — CARVEDILOL 12.5 MG PO TABS
12.5000 mg | ORAL_TABLET | Freq: Two times a day (BID) | ORAL | Status: DC
  Administered 2014-08-14 – 2014-08-16 (×4): 12.5 mg via ORAL
  Filled 2014-08-14 (×7): qty 1

## 2014-08-14 MED ORDER — DICLOFENAC SODIUM 1 % TD GEL
2.0000 g | Freq: Four times a day (QID) | TRANSDERMAL | Status: DC
  Administered 2014-08-14 – 2014-08-16 (×7): 2 g via TOPICAL
  Filled 2014-08-14: qty 100

## 2014-08-14 MED ORDER — ALBUTEROL SULFATE (2.5 MG/3ML) 0.083% IN NEBU: 3.0000 mL | INHALATION_SOLUTION | Freq: Four times a day (QID) | RESPIRATORY_TRACT | Status: DC | PRN

## 2014-08-14 MED ORDER — MIRTAZAPINE 15 MG PO TABS
15.0000 mg | ORAL_TABLET | Freq: Every evening | ORAL | Status: DC | PRN
  Filled 2014-08-14: qty 1

## 2014-08-14 MED ORDER — SODIUM CHLORIDE 0.9 % IV BOLUS (SEPSIS)
500.0000 mL | Freq: Once | INTRAVENOUS | Status: AC
  Administered 2014-08-14: 17:00:00 via INTRAVENOUS

## 2014-08-14 MED ORDER — DEXTROSE 5 % IV SOLN
1.0000 g | INTRAVENOUS | Status: DC
  Administered 2014-08-14: 1 g via INTRAVENOUS
  Filled 2014-08-14 (×2): qty 10

## 2014-08-14 MED ORDER — SODIUM CHLORIDE 0.9 % IV SOLN
INTRAVENOUS | Status: DC
  Administered 2014-08-15: 05:00:00 via INTRAVENOUS

## 2014-08-14 MED ORDER — TAMOXIFEN CITRATE 20 MG PO TABS
20.0000 mg | ORAL_TABLET | Freq: Every day | ORAL | Status: DC
  Administered 2014-08-14 – 2014-08-16 (×3): 20 mg via ORAL
  Filled 2014-08-14 (×3): qty 1

## 2014-08-14 MED ORDER — HYDROCODONE-ACETAMINOPHEN 5-325 MG PO TABS
1.0000 | ORAL_TABLET | Freq: Four times a day (QID) | ORAL | Status: DC | PRN
  Administered 2014-08-14: 1 via ORAL
  Filled 2014-08-14: qty 1

## 2014-08-14 MED ORDER — HEPARIN SODIUM (PORCINE) 5000 UNIT/ML IJ SOLN
5000.0000 [IU] | Freq: Three times a day (TID) | INTRAMUSCULAR | Status: DC
  Administered 2014-08-14 – 2014-08-16 (×5): 5000 [IU] via SUBCUTANEOUS
  Filled 2014-08-14 (×7): qty 1

## 2014-08-14 MED ORDER — ATORVASTATIN CALCIUM 40 MG PO TABS
40.0000 mg | ORAL_TABLET | Freq: Every day | ORAL | Status: DC
  Administered 2014-08-14 – 2014-08-15 (×2): 40 mg via ORAL
  Filled 2014-08-14 (×3): qty 1

## 2014-08-14 MED ORDER — ONDANSETRON HCL 4 MG/2ML IJ SOLN: 4.0000 mg | Freq: Four times a day (QID) | INTRAMUSCULAR | Status: DC | PRN

## 2014-08-14 MED ORDER — GABAPENTIN 300 MG PO CAPS
300.0000 mg | ORAL_CAPSULE | Freq: Two times a day (BID) | ORAL | Status: DC
  Administered 2014-08-14 – 2014-08-16 (×4): 300 mg via ORAL
  Filled 2014-08-14 (×5): qty 1

## 2014-08-14 MED ORDER — METFORMIN HCL 500 MG PO TABS
500.0000 mg | ORAL_TABLET | Freq: Two times a day (BID) | ORAL | Status: DC
  Administered 2014-08-14 – 2014-08-16 (×4): 500 mg via ORAL
  Filled 2014-08-14 (×7): qty 1

## 2014-08-14 MED ORDER — OLOPATADINE HCL 0.1 % OP SOLN
1.0000 [drp] | Freq: Two times a day (BID) | OPHTHALMIC | Status: DC
  Administered 2014-08-14 – 2014-08-16 (×4): 1 [drp] via OPHTHALMIC
  Filled 2014-08-14: qty 5

## 2014-08-14 MED ORDER — ASPIRIN 81 MG PO CHEW
81.0000 mg | CHEWABLE_TABLET | Freq: Every day | ORAL | Status: DC
  Administered 2014-08-14 – 2014-08-16 (×3): 81 mg via ORAL
  Filled 2014-08-14 (×3): qty 1

## 2014-08-14 MED ORDER — MAGNESIUM HYDROXIDE 400 MG/5ML PO SUSP: 30.0000 mL | Freq: Every day | ORAL | Status: DC | PRN

## 2014-08-14 MED ORDER — SODIUM CHLORIDE 0.9 % IJ SOLN: 3.0000 mL | Freq: Two times a day (BID) | INTRAMUSCULAR | Status: DC

## 2014-08-14 MED ORDER — FAMOTIDINE 20 MG PO TABS: 20.0000 mg | ORAL_TABLET | Freq: Two times a day (BID) | ORAL | Status: DC | PRN

## 2014-08-14 MED ORDER — INSULIN ASPART 100 UNIT/ML ~~LOC~~ SOLN
0.0000 [IU] | Freq: Three times a day (TID) | SUBCUTANEOUS | Status: DC
  Administered 2014-08-14: 2 [IU] via SUBCUTANEOUS
  Administered 2014-08-15: 3 [IU] via SUBCUTANEOUS
  Administered 2014-08-15: 1 [IU] via SUBCUTANEOUS
  Administered 2014-08-16: 2 [IU] via SUBCUTANEOUS

## 2014-08-14 MED ORDER — POTASSIUM CHLORIDE ER 10 MEQ PO TBCR
10.0000 meq | EXTENDED_RELEASE_TABLET | Freq: Every day | ORAL | Status: DC
  Administered 2014-08-14 – 2014-08-15 (×2): 10 meq via ORAL
  Filled 2014-08-14 (×4): qty 1

## 2014-08-14 MED ORDER — HEPARIN SODIUM (PORCINE) 5000 UNIT/ML IJ SOLN: 5000.0000 [IU] | Freq: Three times a day (TID) | INTRAMUSCULAR | Status: DC

## 2014-08-14 MED ORDER — ACETAMINOPHEN 500 MG PO TABS
500.0000 mg | ORAL_TABLET | Freq: Three times a day (TID) | ORAL | Status: DC | PRN
  Administered 2014-08-14: 500 mg via ORAL
  Filled 2014-08-14: qty 1

## 2014-08-14 NOTE — H&P (Signed)
[facility] Admission History and Physical Service Pager: [phone]  [name] Medical record number: [id number] Date of birth: July 20, 1924 Age: 80 y.o. Gender: female  Primary Care Provider: [name], MD Consultants: None Code Status: DNR per bedside discussion with patient  Chief Complaint: nausea, vomiting, chills  Assessment and Plan: [name] is a 80 y.o. female presenting with nausea, vomiting, and chills. PMH is complex, significant for asthma, iron-def anemia, bilateral breast cancer with bilatetal mastectomy, sCHF (2008 TTE EF 25-35%), colon cancer, DM type II, CAD, HTN, HLD, memory deficit, PAD, and chewing tobacco use.  # Nausea, vomiting, chills - Sick contact of granddaughter recently. Unable to maintain hydration. Afebrile here with stable vital signs. CMET and CBC WNL. Lung exam clear, no rashes/cyanosis, pt interactive and not somnolent. Most likely a viral gastroenteritis vs flu though UTI also a possibility with increased frequency. UA trace leukocytes, few bacteria, 7-10 WBC. Also consider HHONK. BG 200-250s makes this unlikely. - Direct admit from [facility] clinic to observation on telemetry, attending Dr Andria Frames, clinic attending Dr Ree Kida. - On arrival, dosed CTX 1g to empirically cover for UTI. - Urine culture pending. - Flu PCR. Droplet isolation while pending. - 500cc bolus + 75cc/hr maintenance, hydrate carefully given systolic heart failure. Monitor for fluid o/l. - Clears diet, advance if nausea improves.  - Zofran PRN. - Orthostatics on arrival. - BCx if spikes fever.  # Right leg pain - Mostly right hip. Started 5 days ago, not improved with tramadol, could not afford voltaren gel. Exam with reproduced pain on right hip abduction, no erythema, swelling, or tenderness; no reported trauma.  Most likely muscle strain. Able to ambulate on leg with no weakness or buckling. Could consider rhabdo as well though  unlikely. - CK - Voltaren gel and continue gabapentin, PRN tylenol, and PRN norco. - No xray given no swelling or tenderness. - No sign of DVT but with h/o cancer, if any erythema or calf tenderness, would check duplex. - PT eval and tx  # DM - Most recent A1c 7.7 01/28/14.  - A1c - SSI - Continue metformin.  # Cardiovascular:  CHF systolic, EF 56-43 at last check in 2008. CAD. - Hold home cozaar and lasix given poor PO at this time. Resume once tolerating PO. - Monitor for any signs of fluid overload. Currently mild LE swelling but otherwise no signs of fluid overload. - Continue aspirin, coreg, and high intensity statin.  # HTN - BP stable.  - Orthostatics. - Continue coreg in setting of CAD. - Hold home cozaar, norvasc, and lasix while poor PO.  # H/o breast cancer - Continue tamoxifen.  # Asthma - Stable with good respiratory status and no wheezing on exam. - Albuterol PRN.  # Constipation  - Continue home milk of magnesium  - Senokot PRN.  # Iron deficiency anemia - CChronic. Baseline Hgb appears to be around 10, currently 10.9.  FEN/GI: Clear liquids, 500cc bolus, 75cc/hr Prophylaxis: SQ heparin  Disposition: Pending improved hydration and ability to tolerate PO in woman with complex PMH.  History of Present Illness: [name] is a 80 y.o. female presenting with nausea and vomiting for 5-6 days with 1-2 days of inability to maintain PO. Has also had chills, increased urinary frequency. Denies bloody emesis or stool, dysuria, fevers, chest pain, dyspnea, activity level change, skin changes, rash, bug bites, abdominal pain, or diarrhea. Denies new medications other than tramadol recently. Granddaughter who she  has been around had similar symptoms 1 week ago.  She reports that around that time she also started having right leg soreness. Feet are chronically swollen but right leg swelling is new. Denies trauma. Hurts more to move it at the hip, not lower in the leg.  Denies buckling. Tramadol did not help. Unable to purchase "some gel" for pain due to lack of insurance coverage.  Review Of Systems: Per HPI with the following additions: None Otherwise 12 point review of systems was performed and was unremarkable.  Patient Active Problem List   Diagnosis Date Noted  . Nausea with vomiting 08/14/2014  . Vomiting 08/14/2014  . Right ankle pain 08/10/2014  . Hypercalcemia 06/09/2014  . Breast cancer, left breast 06/09/2014  . Breast cancer, right breast 06/09/2014  . Edema of left lower extremity 03/30/2014  . PAD (peripheral artery disease) 03/30/2014  . Colon cancer 12/11/2013  . Neck pain 10/22/2013  . Chest pain, unspecified 09/19/2013  . Bilateral neck pain 09/19/2013  . Light headed 06/16/2013  . History of fall 05/15/2013  . Right thigh pain 05/15/2013  . Memory deficit 05/15/2013  . Bilateral leg pain 03/07/2013  . Trigger thumb of right hand 03/07/2013  . Lower extremity edema 02/19/2013  . Weakness generalized 01/22/2013  . Stricture and stenosis of esophagus 10/27/2012  . Food impaction of esophagus 10/27/2012  . Gout attack 09/13/2012  . Poor social situation 09/13/2012  . Low back pain 08/07/2012  . Musculoskeletal chest pain 07/10/2012  . Thoracic back pain 05/24/2012  . Weight loss 04/03/2012  . Insomnia 09/25/2011  . Urinary frequency 06/02/2011  . Tobacco abuse 06/02/2011  . History of breast cancer in female, bilateral.  Mastectomies 10/24/2010. 04/26/2011  . Hiatal hernia 03/22/2011  . Colon polyp 03/22/2011  . Poor circulation 03/22/2011  . Other malaise and fatigue 03/22/2011  . 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 NEUROPATHY 08/25/2009  . ANEMIA-IRON DEFICIENCY 04/29/2007  . HYPERLIPIDEMIA 01/29/2007  . DIABETES MELLITUS II, UNCOMPLICATED 16/08/9603  . HYPERTENSION, BENIGN SYSTEMIC 01/10/2007  . CORONARY, ARTERIOSCLEROSIS 01/10/2007  . Chronic systolic heart failure 54/07/8118  . ASTHMA, PERSISTENT  01/10/2007  . Reflux esophagitis 01/10/2007  . ARTHRITIS 01/10/2007   Past Medical History: Past Medical History  Diagnosis Date  . Breast cancer 10/2010    Invasive Ductal Carcinoma, s/p bilateral mastectomy, now on Tamoxifen. Followed by Dr Jamse Arn.   . Diabetes mellitus   . Hypertension   . Hyperlipidemia   . Anemia     Iron def anemia  . CAD (coronary artery disease)   . CHF (congestive heart failure)     Systolic   . History of colon cancer   . Neuropathy   . Allergy   . GERD (gastroesophageal reflux disease)   . Hiatal hernia   . Diverticulosis   . Asthma   . Arthritis   . H/O: GI bleed   . Breast cancer 2011    s/p Bilateral masectomy  . Shortness of breath   . Pneumonia 03/2012  . Carotid artery aneurysm 08/2010    right ICA, 5 x 46mm  . DDD (degenerative disc disease), cervical   . Colon cancer   . Colon cancer 12/11/2013  . Myocardial infarction   . Hypercalcemia 06/09/2014   Past Surgical History: Past Surgical History  Procedure Laterality Date  . Breast masectomy  10/2010    Bilateral masectomy by Dr Lucia Gaskins s/p invasive ductal carcinoma.  . Cardiac  catherization  2007    Severe 3-vessel disease.  EF 20-25%.  . Esophagogastroduodenoscopy  2001    Esophageal Tear  . Total knee arthroplasty  2001  . Esophagogastroduodenoscopy  10/27/2012    Procedure: ESOPHAGOGASTRODUODENOSCOPY (EGD);  Surgeon: Irene Shipper, MD;  Location[facility] ENDOSCOPY;  Service: Endoscopy;  Laterality: N/A;  pat  . Breast surgery     Social History: History  Substance Use Topics  . Smoking status: Never Smoker   . Smokeless tobacco: Current User    Types: Snuff  . Alcohol Use: No  Son lives with her. None. Manages medication and food. Daughter helps with showers. Someone takes her to grocery shop. Able to get up and ambulate with cart while shopping.  Additional social history: None. Please also refer to relevant sections of EMR.  Family History: Family History  Problem  Relation Age of Onset  . Sickle cell anemia Other   . Heart disease Mother   . Heart disease Father   . Cancer Daughter 5    breast ca  . Cancer Daughter 62    breast ca   Allergies and Medications: Allergies  Allergen Reactions  . Peanuts [Nuts] Swelling  . Lisinopril Cough   No current facility-administered medications on file prior to encounter.   Current Outpatient Prescriptions on File Prior to Encounter  Medication Sig Dispense Refill  . acetaminophen (TYLENOL) 500 MG tablet Take 1-2 tablets (500-1,000 mg total) by mouth every 8 (eight) hours as needed.  30 tablet  0  . albuterol (PROVENTIL HFA;VENTOLIN HFA) 108 (90 BASE) MCG/ACT inhaler Inhale 2 puffs into the lungs every 6 (six) hours as needed. For shortness of breath  1 Inhaler  6  . amLODipine (NORVASC) 10 MG tablet Take 1 tablet (10 mg total) by mouth daily.  30 tablet  3  . aspirin (ASPIRIN CHILDRENS) 81 MG chewable tablet Chew 81 mg by mouth daily.        . carvedilol (COREG) 12.5 MG tablet Take 1 tablet (12.5 mg total) by mouth 2 (two) times daily with a meal.  60 tablet  2  . colchicine 0.6 MG tablet Take 1 tablet (0.6 mg total) by mouth 2 (two) times daily.  60 tablet  5  . diclofenac sodium (VOLTAREN) 1 % GEL Apply 2 g topically 4 (four) times daily.  100 g  3  . furosemide (LASIX) 20 MG tablet Take 1 tablet (20 mg total) by mouth daily.  30 tablet  3  . HYDROcodone-acetaminophen (NORCO/VICODIN) 5-325 MG per tablet Take 1 tablet by mouth every 6 (six) hours as needed for moderate pain.  30 tablet  0  . losartan (COZAAR) 50 MG tablet Take 50 mg by mouth at bedtime.       . magnesium hydroxide (MILK OF MAGNESIA) 400 MG/5ML suspension Take 30 mLs by mouth daily as needed for mild constipation.      . metFORMIN (GLUCOPHAGE) 500 MG tablet Take 500 mg by mouth 2 (two) times daily with a meal.      . mirtazapine (REMERON) 15 MG tablet Take 15 mg by mouth at bedtime as needed (sleep).      Marland Kitchen PATADAY 0.2 % SOLN Place 1 drop  into both eyes daily as needed. For itchy eyes      . potassium chloride (K-DUR) 10 MEQ tablet Take 1 tablet (10 mEq total) by mouth daily. Take with the lasix  30 tablet  0  . ranitidine (ZANTAC) 150 MG capsule Take 1 capsule (150 mg total) by mouth 2 (two) times daily as  needed for heartburn.  60 capsule  3  . rosuvastatin (CRESTOR) 20 MG tablet Take 20 mg by mouth at bedtime.       . tamoxifen (NOLVADEX) 20 MG tablet Take 1 tablet (20 mg total) by mouth daily.  30 tablet  4  . [DISCONTINUED] cetirizine (ZYRTEC) 5 MG tablet Take 1 tablet (5 mg total) by mouth daily. Take at night time as it can cause some drowsiness  30 tablet  4    Objective: BP 134/90  Pulse 84  Temp(Src) 98.4 F (36.9 C) (Oral)  Resp 16  Ht 4\' 11"  (1.499 m)  Wt 125 lb (56.7 kg)  BMI 25.23 kg/m2  SpO2 100% Exam: General:NAD, pleasant HEENT: AT/[place], arcus senilis, PERRL, o/p clear with greenish discoloration on tongue, neck supple Cardiovascular: distant, RRR, trace bilateral radial pulses and unable to palpate DP pulses bilaterally Respiratory: CTAB, normal effort Abdomen: S/NT/ND, no suprapubic tenderenss Extremities: Trace-1+ pitting LE edema to ankles, 4/5 right hip flexion and 3+/5 abduction (reproduces pain); no erythema or tenderness at right hip greater trochanter or right buttock; no swelling. Skin: No rash or cyanosis Neuro: CN 2-12 tested and intact; normal speech  Labs and Imaging: CBC BMET   Recent Labs Lab 08/14/14 1400  WBC 5.7  HGB 10.9*  HCT 33.0*  PLT 141*   No results found for this basename: NA, K, CL, CO2, BUN, CREATININE, GLUCOSE, CALCIUM,  in the last 168 hours    Hilton Sinclair, MD 08/14/2014, 3:47 PM PGY-3, [place] Intern pager: [phone], text pages welcome

## 2014-08-14 NOTE — H&P (Signed)
FMTS Attending Note  I personally saw and evaluated the patient. The plan of care was discussed with the resident team. I agree with the assessment and plan as documented by the resident.   Patient seen in office with Dr. Sherril Cong. Patient presented with intractable nausea and vomiting. Clinically consistent with UTI vs Gastroenteritis vs Influenza. Agree with observation admission for lab work, IVF resuscitation, and supportive care. Treat empirically for UTI with Rocephin.   Dossie Arbour MD

## 2014-08-14 NOTE — Progress Notes (Signed)
MD made aware of low diastolic BP at 859/27 as per order.  Rest of VS stable T99.3, P72, RR18, O2Sat99%RA.  Pt. sleeping but easily arousable and no apparent distress noted.  No further order received.

## 2014-08-14 NOTE — Assessment & Plan Note (Addendum)
Unable to tolerate PO, 80yo lives with alcoholic son, UTI vs. viral gastro - UA/UCx then 1g rocephin - CBG 251 in clinic - Admit to observation, wheeling to hospital now, report called to nursing and admitting team

## 2014-08-14 NOTE — Progress Notes (Signed)
   Subjective:    Patient ID: [name], female    DOB: Sep 17, 1924, 80 y.o.   MRN: [id number]  Urinary Frequency  Associated symptoms include frequency and vomiting.  Emesis    Pt presents with 5 days of nausea/vomiting, chills, urinary frequency and leg swelling. Felt fine last week but Monday woke up freezing cold. She then started vomiting and has been unable to tolerate anything by mouth since then. She reports feeling the worst she has ever felt in her life. Yesterday she called her daughter and slept at her house because she felt so bad. She has not had fevers, dysuria, abdominal pain or diarrhea. No sick contacts.   Review of Systems  Gastrointestinal: Positive for vomiting.  Genitourinary: Positive for frequency.   See HPI    Objective:   Physical Exam  Nursing note and vitals reviewed. Constitutional: She is oriented to person, place, and time. She appears well-developed and well-nourished. She appears distressed.  HENT:  Head: Normocephalic and atraumatic.  Eyes: Conjunctivae are normal. Right eye exhibits no discharge. Left eye exhibits no discharge. No scleral icterus.  Cardiovascular: Normal rate.   No murmur heard. Distant heart sounds  Pulmonary/Chest: Effort normal and breath sounds normal. No respiratory distress. She has no wheezes.  Abdominal: Soft. She exhibits no distension and no mass. There is no tenderness. There is no rebound and no guarding.  Neurological: She is alert and oriented to person, place, and time.  Skin: She is diaphoretic.  Cool and clammy  Psychiatric: She has a normal mood and affect. Her behavior is normal.          Assessment & Plan:

## 2014-08-15 DIAGNOSIS — Z79810 Long term (current) use of selective estrogen receptor modulators (SERMs): Secondary | ICD-10-CM | POA: Diagnosis not present

## 2014-08-15 DIAGNOSIS — M1990 Unspecified osteoarthritis, unspecified site: Secondary | ICD-10-CM | POA: Diagnosis present

## 2014-08-15 DIAGNOSIS — B9620 Unspecified Escherichia coli [E. coli] as the cause of diseases classified elsewhere: Secondary | ICD-10-CM | POA: Diagnosis present

## 2014-08-15 DIAGNOSIS — F17220 Nicotine dependence, chewing tobacco, uncomplicated: Secondary | ICD-10-CM | POA: Diagnosis present

## 2014-08-15 DIAGNOSIS — Z7982 Long term (current) use of aspirin: Secondary | ICD-10-CM | POA: Diagnosis not present

## 2014-08-15 DIAGNOSIS — R1110 Vomiting, unspecified: Secondary | ICD-10-CM | POA: Diagnosis not present

## 2014-08-15 DIAGNOSIS — Z9181 History of falling: Secondary | ICD-10-CM

## 2014-08-15 DIAGNOSIS — R112 Nausea with vomiting, unspecified: Secondary | ICD-10-CM | POA: Diagnosis present

## 2014-08-15 DIAGNOSIS — J45909 Unspecified asthma, uncomplicated: Secondary | ICD-10-CM | POA: Diagnosis present

## 2014-08-15 DIAGNOSIS — Z79899 Other long term (current) drug therapy: Secondary | ICD-10-CM | POA: Diagnosis not present

## 2014-08-15 DIAGNOSIS — I82811 Embolism and thrombosis of superficial veins of right lower extremities: Secondary | ICD-10-CM | POA: Diagnosis present

## 2014-08-15 DIAGNOSIS — K5900 Constipation, unspecified: Secondary | ICD-10-CM | POA: Diagnosis present

## 2014-08-15 DIAGNOSIS — I2510 Atherosclerotic heart disease of native coronary artery without angina pectoris: Secondary | ICD-10-CM | POA: Diagnosis present

## 2014-08-15 DIAGNOSIS — E119 Type 2 diabetes mellitus without complications: Secondary | ICD-10-CM | POA: Diagnosis present

## 2014-08-15 DIAGNOSIS — G629 Polyneuropathy, unspecified: Secondary | ICD-10-CM | POA: Diagnosis present

## 2014-08-15 DIAGNOSIS — Z85038 Personal history of other malignant neoplasm of large intestine: Secondary | ICD-10-CM | POA: Diagnosis not present

## 2014-08-15 DIAGNOSIS — I10 Essential (primary) hypertension: Secondary | ICD-10-CM | POA: Diagnosis present

## 2014-08-15 DIAGNOSIS — I5022 Chronic systolic (congestive) heart failure: Secondary | ICD-10-CM | POA: Diagnosis not present

## 2014-08-15 DIAGNOSIS — M25551 Pain in right hip: Secondary | ICD-10-CM | POA: Diagnosis present

## 2014-08-15 DIAGNOSIS — Z794 Long term (current) use of insulin: Secondary | ICD-10-CM | POA: Diagnosis not present

## 2014-08-15 DIAGNOSIS — D508 Other iron deficiency anemias: Secondary | ICD-10-CM | POA: Diagnosis present

## 2014-08-15 DIAGNOSIS — Z9013 Acquired absence of bilateral breasts and nipples: Secondary | ICD-10-CM | POA: Diagnosis present

## 2014-08-15 DIAGNOSIS — I252 Old myocardial infarction: Secondary | ICD-10-CM | POA: Diagnosis not present

## 2014-08-15 DIAGNOSIS — K449 Diaphragmatic hernia without obstruction or gangrene: Secondary | ICD-10-CM

## 2014-08-15 DIAGNOSIS — Z96659 Presence of unspecified artificial knee joint: Secondary | ICD-10-CM | POA: Diagnosis present

## 2014-08-15 DIAGNOSIS — E785 Hyperlipidemia, unspecified: Secondary | ICD-10-CM | POA: Diagnosis present

## 2014-08-15 DIAGNOSIS — I739 Peripheral vascular disease, unspecified: Secondary | ICD-10-CM | POA: Diagnosis present

## 2014-08-15 DIAGNOSIS — B349 Viral infection, unspecified: Secondary | ICD-10-CM | POA: Diagnosis present

## 2014-08-15 DIAGNOSIS — M79609 Pain in unspecified limb: Secondary | ICD-10-CM

## 2014-08-15 DIAGNOSIS — N390 Urinary tract infection, site not specified: Secondary | ICD-10-CM | POA: Diagnosis not present

## 2014-08-15 DIAGNOSIS — Z66 Do not resuscitate: Secondary | ICD-10-CM | POA: Diagnosis present

## 2014-08-15 DIAGNOSIS — Z853 Personal history of malignant neoplasm of breast: Secondary | ICD-10-CM | POA: Diagnosis not present

## 2014-08-15 LAB — GLUCOSE, CAPILLARY
GLUCOSE-CAPILLARY: 236 mg/dL — AB (ref 70–99)
GLUCOSE-CAPILLARY: 111 mg/dL — AB (ref 70–99)
Glucose-Capillary: 123 mg/dL — ABNORMAL HIGH (ref 70–99)
Glucose-Capillary: 179 mg/dL — ABNORMAL HIGH (ref 70–99)

## 2014-08-15 LAB — HEMOGLOBIN A1C
Hgb A1c MFr Bld: 9 % — ABNORMAL HIGH
Mean Plasma Glucose: 212 mg/dL — ABNORMAL HIGH

## 2014-08-15 LAB — INFLUENZA PANEL BY PCR (TYPE A & B)
H1N1 flu by pcr: NOT DETECTED
Influenza A By PCR: NEGATIVE
Influenza B By PCR: NEGATIVE

## 2014-08-15 MED ORDER — CEFTRIAXONE SODIUM 1 G IJ SOLR
1.0000 g | Freq: Once | INTRAMUSCULAR | Status: AC
  Administered 2014-08-15: 1 g via INTRAMUSCULAR
  Filled 2014-08-15: qty 10

## 2014-08-15 MED ORDER — CEFTRIAXONE SODIUM 250 MG IJ SOLR: 250.0000 mg | Freq: Once | INTRAMUSCULAR | Status: DC

## 2014-08-15 NOTE — Progress Notes (Signed)
UR completed 

## 2014-08-15 NOTE — Progress Notes (Signed)
Seen and examined.  Agree with the documentation and management of Dr. Darene Lamer.  Admitted for nausea and vomiting.  States she is much improved and tolerating PO well.  Possible DC post lunch today.  Family member did have presumed viral GE - and I presume that is the cause of [name]'s acute illnes.

## 2014-08-15 NOTE — Progress Notes (Signed)
IV site removed due to infiltration.  Patient said that she is a hard stick and wants IV team to stick her.  IV team made aware and day shift IV team will do it.

## 2014-08-15 NOTE — Progress Notes (Signed)
Physical Therapy Evaluation Patient Details Name: [name] MRN: [id number] DOB: May 04, 1924 Today's Date: 08/15/2014   History of Present Illness  80 yo female admitted to hospital from medical clinic on 08/14/14 for 5 day hx of nausea/vomiting.  Clinical Impression  Patient cleared by nursing for treatment, preliminary doppler is - DVT, but + superficial thrombus in R LE, on anticoagulation (heprin).  Droplet Isolation precautions.  Patient reports mild pain in R hip of chronic nature, and R LE medial calf pain also mild.  Demonstrates Supervision level assistance for bed mobility, transfers, and gait with RW.  Patient has medical issues being actively addressed, but does not require skilled PT services at this time.  Will DC PT order, please re-order PT consultation if patient status changes.    Follow Up Recommendations Supervision - Intermittent    Equipment Recommendations  None recommended by PT (Patient has all appropriate equipment)    Recommendations for Other Services       Precautions / Restrictions Restrictions Weight Bearing Restrictions: No      Mobility  Bed Mobility Overal bed mobility: Needs Assistance Bed Mobility: Sidelying to Sit   Sidelying to sit: Supervision          Transfers Overall transfer level: Needs assistance Equipment used: Rolling walker (2 wheeled) Transfers: Sit to/from Stand Sit to Stand: Supervision            Ambulation/Gait Ambulation/Gait assistance: Supervision Ambulation Distance (Feet): 150 Feet Assistive device: Rolling walker (2 wheeled) Gait Pattern/deviations: Step-through pattern Gait velocity: mildly decreased Gait velocity interpretation: Below normal speed for age/gender    Stairs            Wheelchair Mobility    Modified Rankin (Stroke Patients Only)       Balance Overall balance assessment: Needs assistance Sitting-balance support: No upper extremity supported Sitting balance-Leahy Scale:  Good     Standing balance support: Bilateral upper extremity supported Standing balance-Leahy Scale: Fair                               Pertinent Vitals/Pain Pain Assessment: 0-10 Pain Score: 2  Pain Location: R hip (arthritis per patient), and R medial calf (-DVT, +superficial VT) Pain Descriptors / Indicators: Aching Pain Intervention(s): Monitored during session;Repositioned    Home Living Family/patient expects to be discharged to:: Private residence Living Arrangements: Children Available Help at Discharge: Family;Available 24 hours/day Type of Home: House Home Access: Level entry     Home Layout: One level Home Equipment: Walker - 2 wheels;Walker - 4 wheels;Cane - single point;Bedside commode;Shower seat      Prior Function Level of Independence: Independent with assistive device(s)         Comments: Uses cane and RW at home as needed     Hand Dominance        Extremity/Trunk Assessment   Upper Extremity Assessment: Overall WFL for tasks assessed           Lower Extremity Assessment: Overall WFL for tasks assessed         Communication   Communication: No difficulties  Cognition Arousal/Alertness: Awake/alert Behavior During Therapy: WFL for tasks assessed/performed Overall Cognitive Status: Within Functional Limits for tasks assessed                      General Comments      Exercises        Assessment/Plan  PT Assessment Patent does not need any further PT services  PT Diagnosis Generalized weakness   PT Problem List    PT Treatment Interventions     PT Goals (Current goals can be found in the Care Plan section) Acute Rehab PT Goals Patient Stated Goal: To go home PT Goal Formulation: With patient Time For Goal Achievement: 09-11-14 Potential to Achieve Goals: Good    Frequency     Barriers to discharge        Co-evaluation               End of Session Equipment Utilized During Treatment:  Gait belt Activity Tolerance: Patient tolerated treatment well Patient left: in bed;with call bell/phone within reach Nurse Communication: Mobility status         Time: 1645-1710 PT Time Calculation (min): 25 min   Charges:   PT Evaluation $Initial PT Evaluation Tier I: 1 Procedure PT Treatments $Therapeutic Activity: 8-22 mins   PT G Codes:          [name] L 11-Sep-2014, 5:30 PM

## 2014-08-15 NOTE — Progress Notes (Signed)
Ms. Jaycelyn Orrison is one of my clinic patients. She presented to clinic with N/V, chills, and right leg soreness and was a direct admit due to concerns about poor PO intake.  When I spoke with her this morning she tells me she never had nausea/vomiting and she came in because she "just didn't feel well," most likely because she's been so busy. She is feeling better this morning and is smiling during our conversation.  Additionally, her A1c was noted to be elevated to 9.0 on this admission. In clinic we had previously discussed the need to change her DM regimen if her A1c continued to rise and she is aware of the new A1c and is agreeable to change.  I appreciate the excellent/compassionate care that the FPTS is providing to my patient and I will continue to follow along peripherally.   [name], MD [facility] Family Medicine Resident

## 2014-08-15 NOTE — Progress Notes (Signed)
Family Medicine Teaching Service Daily Progress Note Intern Pager: [phone]  [name] Medical record number: [id number] Date of birth: 08/18/1924 Age: 80 y.o. Gender: female  Primary Care Provider: [name], MD Consultants: None Code Status: DNR  Pt Overview and Major Events to Date:  10/2: Admitted for intractable nausea/vomiting and inability to tolerate PO  Assessment and Plan:  [name] is a 80 y.o. female presenting with nausea, vomiting, and chills. PMH is complex, significant for asthma, iron-def anemia, bilateral breast cancer with bilatetal mastectomy, sCHF (2008 TTE EF 25-35%), colon cancer, DM type II, CAD, HTN, HLD, memory deficit, PAD, and chewing tobacco use.   # Nausea, vomiting, chills - Sick contact of granddaughter recently. Unable to maintain hydration. Afebrile here with stable vital signs. Admission CMET, CBC, and lung exam normal, no rashes/cyanosis, pt interactive. Most likely a viral gastroenteritis vs flu though UTI also a possibility with increased frequency. UA trace leukocytes, few bacteria, 7-10 WBC. Also consider HHONK. BG 200-250s makes this unlikely.  - On arrival, dosed CTX 1g to empirically cover for UTI.  - Urine culture pending.  - Flu PCR. Droplet isolation while pending.  - 500cc bolus + 75cc/hr maintenance, hydrate carefully given systolic heart failure. Monitor for fluid o/l.  - Zofran PRN.  - Orthostatics on arrival positive (lying 148/48 pulse 84, sitting 151/90 pulse 90, stand 134/51 pulse 108)  - BCx if spikes fever.  - Clears diet, advance if nausea improves (urged pt to trial PO today).  # Right leg pain - Mostly right hip, today includes right calf, not improved with tramadol, could not afford voltaren gel. Exam most c/w muscle strain. No trauma. Able to ambulate on leg with no weakness or buckling. CK normal. - Voltaren gel and continue gabapentin, PRN tylenol, and PRN norco.  - No xray given no swelling or  tenderness.  - PT eval and tx  - No sign of DVT and no h/o clot but with h/o cancer and calf tenderness right side today, will check duplex.  # DM - Most recent A1c 7.7 01/28/14. Increased to 9. PCP aware. - SSI and calculate total in 24 hours tomorrow to inform if begin lantus. - Continue metformin.   # Cardiovascular:  CHF systolic, EF 09-73 at last check in 2008.  CAD.  - Hold home cozaar and lasix given poor PO at this time. Resume once tolerating PO.  - Monitor for any signs of fluid overload. Currently mild LE swelling and atelectasis but otherwise no signs of fluid overload.  - Order IS to bedside. - Continue aspirin, coreg, and high intensity statin.   # HTN - BP stable though wide pulse pressure overnight with BP 144/54. - Orthostatics positive. Continue gentle IV hydration and encouraged PO. - Continue coreg in setting of CAD.  - Hold home cozaar, norvasc, and lasix while poor PO.  # H/o breast cancer  - Continue tamoxifen.   # Asthma - Stable with good respiratory status and no wheezing on exam.  - Albuterol PRN.   # Constipation  - Continue home milk of magnesium  - Senokot PRN.   # Iron deficiency anemia - Chronic. Baseline Hgb appears to be around 10, currently 10.9.   FEN/GI: Clear liquids, 500cc bolus, 75cc/hr  Prophylaxis: SQ heparin   Disposition: Pending improved hydration and ability to tolerate PO in woman with complex PMH.  Subjective:  Feels okay this morning though right hip and now right calf painful. Today, endorses  calf pain present for 5 days as well though yesterday denied this. No chest pain or dyspnea. No fevers or chills. No nausea since yesterday but has not tried any more PO since yesterday.  Objective: Temp:  [98 F (36.7 C)-100.2 F (37.9 C)] 98.4 F (36.9 C) (10/03 0559) Pulse Rate:  [63-104] 63 (10/03 0559) Resp:  [16-18] 16 (10/03 0559) BP: (134-176)/(47-90) 144/54 mmHg (10/03 0559) SpO2:  [97 %-100 %] 97 % (10/03 0559) Weight:   [123 lb 12.8 oz (56.155 kg)-125 lb (56.7 kg)] 125 lb (56.7 kg) (10/02 1315) Physical Exam: General: NAD, lying in bed Cardiovascular: heart sounds clearer today, RRR with II/VI systolic murmur, still trace radial pulses and unable to palpate DP pulses bilaterally Respiratory:  normal effort and fine crackles bilateral bases. Otherwise CTAB Abdomen: S/NT/ND/NABS Extremities: 2+ LE edema to mid-shin  Laboratory:  Recent Labs Lab 08/14/14 1400  WBC 5.7  HGB 10.9*  HCT 33.0*  PLT 141*    Recent Labs Lab 08/14/14 1400  NA 137  K 3.9  CL 100  CO2 23  BUN 14  CREATININE 0.97  CALCIUM 10.3  PROT 8.0  BILITOT 0.3  ALKPHOS 46  ALT 5  AST 11  GLUCOSE 205*     Hilton Sinclair, MD 08/15/2014, 8:34 AM PGY-3, [place] Intern pager: [phone], text pages welcome

## 2014-08-15 NOTE — Progress Notes (Signed)
VASCULAR LAB PRELIMINARY  PRELIMINARY  PRELIMINARY  PRELIMINARY  Right lower extremity venous Doppler completed.    Preliminary report:  There is no DVT noted in the right lower extremity.  There is superficial thrombosis noted in the greater saphenous vein throughout the right calf.  Master Touchet, RVT 08/15/2014, 12:23 PM

## 2014-08-15 NOTE — Discharge Summary (Signed)
[facility] Discharge Summary  [name] Medical record number: [id number] Date of birth: 05-20-1924 Age: 80 y.o. Gender: female Date of Admission: 08/14/2014  Date of Discharge: 08/16/14 Admitting Physician: Zigmund Gottron, MD  Primary Care Provider: [name], MD Consultants: None  Indication for Hospitalization: Intractable Nausea and vomiting  Discharge Diagnoses/Problem List:   Intractable Nausea and vomiting  Superficial venous thrombosis   DM2  Iron deficiency anemia  CAD  HFrEF  HTN  Disposition: Home  Discharge Condition: Stable  Discharge Exam:  Filed Vitals:   08/16/14 0652  BP: 136/56  Pulse: 68  Temp: 98.2 F (36.8 C)  Resp: 16  General: NAD, lying in bed  Cardiovascular: heart sounds clearer today, RRR with II/VI systolic murmur, still trace radial pulses and unable to palpate DP pulses bilaterally  Respiratory: normal effort and fine crackles bilateral bases. Otherwise CTAB  Abdomen: S/NT/ND/NABS  Extremities: 2+ LE edema to mid-shin   Brief Hospital Course:  [name] is a 80 y.o. female presented with nausea, vomiting, and chills most likely due to UTI vs viral illness. PMH is complex, significant for asthma, iron-def anemia, bilateral breast cancer with bilatetal mastectomy, sCHF (2008 TTE EF 25-35%), colon cancer, DM type II, CAD, HTN, HLD, memory deficit, PAD, and chewing tobacco use. N/V resolved with IVFs, Abx and Zofran. She was discharged home once tolerating normal PO intake.   # UTI - Inc Freq on admit with UA showing trace leukocytes, few bacteria, 7-10 WBC. UCx = E coli. Treated with Ceftriaxone x 2 days and discharged with Keflex QID x 5 days.   # Right leg pain: Mainly lateral hip but also complained of calf pain. Dopplers obtained revealing superficial venous thrombosis in greater saphenous vein  w/o DVT. Hx of Colon and breast CA without current signs of active CA.  Patient also on Tamoxifen. She was advised to apply warm compresses and elevate leg. Follow-up with PCP and discuss continued Tamoxifen with Oncologist. Able to ambulate with PT; No equipment needs. Use Voltaren cream for right lateral hip pain and right calf pain.   # DM - Most recent A1c 7.7 01/28/14. Increased to 9. Metformin continued; PCP to consider increasing.   # Cardiovascular: HTN, CAD and CHF systolic EF 75-64 at last check in 2008.   Continued aspirin, coreg, and high intensity statin. Held cozaar, Norvasc and lasix due to poor PO intake; PCP can resume as needed.   # Iron deficiency anemia : Chronic. At baseline ~ 10.   # H/o breast cancer: Continued tamoxifen. Discuss stopping with Onc given has Superficial venous thrombosis   Issues for Follow Up:   Superficial venous thrombosis in greater saphenous vein. Discuss Tamoxifen with oncologist  Consider restarting Cozaar, Norvasc and Lasix as needed. Would restart Cozaar if BP will tolerate, and consider decreasing Coreg to restart Cozaar if needed  A1c = 9; Consider Inc Metformin  Significant Procedures: Venous Doppler  Significant Labs and Imaging:   Recent Labs Lab 08/14/14 1400 08/16/14 0353  WBC 5.7 3.9*  HGB 10.9* 9.2*  HCT 33.0* 28.7*  PLT 141* 147*    Recent Labs Lab 08/14/14 1400 08/16/14 0353  NA 137 138  K 3.9 4.1  CL 100 104  CO2 23 24  GLUCOSE 205* 149*  BUN 14 12  CREATININE 0.97 1.11*  CALCIUM 10.3 9.7  ALKPHOS 46  --   AST 11  --   ALT 5  --  ALBUMIN 3.6  --    Flu PCR: Neg Hgb A1c: 9  Results/Tests Pending at Time of Discharge: Urine sensitivities   Discharge Medications:    Medication List    STOP taking these medications       amLODipine 10 MG tablet  Commonly known as:  NORVASC     furosemide 20 MG tablet  Commonly known as:  LASIX     HYDROcodone-acetaminophen 5-325 MG per tablet  Commonly known as:  NORCO/VICODIN     losartan 50 MG tablet  Commonly known as:   COZAAR     potassium chloride 10 MEQ tablet  Commonly known as:  K-DUR      TAKE these medications       acetaminophen 500 MG tablet  Commonly known as:  TYLENOL  Take 1-2 tablets (500-1,000 mg total) by mouth every 8 (eight) hours as needed.     albuterol 108 (90 BASE) MCG/ACT inhaler  Commonly known as:  PROVENTIL HFA;VENTOLIN HFA  Inhale 2 puffs into the lungs every 6 (six) hours as needed. For shortness of breath     ASPIRIN CHILDRENS 81 MG chewable tablet  Generic drug:  aspirin  Chew 81 mg by mouth daily.     carvedilol 12.5 MG tablet  Commonly known as:  COREG  Take 1 tablet (12.5 mg total) by mouth 2 (two) times daily with a meal.     cephALEXin 500 MG capsule  Commonly known as:  KEFLEX  Take 1 capsule (500 mg total) by mouth 4 (four) times daily.     colchicine 0.6 MG tablet  Take 1 tablet (0.6 mg total) by mouth 2 (two) times daily.     diclofenac sodium 1 % Gel  Commonly known as:  VOLTAREN  Apply 2 g topically 4 (four) times daily.     gabapentin 300 MG capsule  Commonly known as:  NEURONTIN  Take 300 mg by mouth 2 (two) times daily.     metFORMIN 500 MG tablet  Commonly known as:  GLUCOPHAGE  Take 500 mg by mouth 2 (two) times daily with a meal.     MILK OF MAGNESIA 400 MG/5ML suspension  Generic drug:  magnesium hydroxide  Take 30 mLs by mouth daily as needed for mild constipation.     mirtazapine 15 MG tablet  Commonly known as:  REMERON  Take 15 mg by mouth at bedtime as needed (sleep).     PATADAY 0.2 % Soln  Generic drug:  Olopatadine HCl  Place 1 drop into both eyes daily as needed. For itchy eyes     ranitidine 150 MG capsule  Commonly known as:  ZANTAC  Take 1 capsule (150 mg total) by mouth 2 (two) times daily as needed for heartburn.     rosuvastatin 20 MG tablet  Commonly known as:  CRESTOR  Take 20 mg by mouth at bedtime.     tamoxifen 20 MG tablet  Commonly known as:  NOLVADEX  Take 1 tablet (20 mg total) by mouth daily.         Discharge Instructions: Please refer to Patient Instructions section of EMR for full details.  Patient was counseled important signs and symptoms that should prompt return to medical care, changes in medications, dietary instructions, activity restrictions, and follow up appointments.   Follow-Up Appointments: Follow-up Information   Schedule an appointment as soon as possible for a visit with [name], MD.   Specialty:  Family Medicine   Contact information:  [address] [place] [address] [place] Charlee Squibb, MD 08/16/2014, 8:43 AM PGY-2, [place]

## 2014-08-16 DIAGNOSIS — N390 Urinary tract infection, site not specified: Secondary | ICD-10-CM | POA: Diagnosis not present

## 2014-08-16 LAB — CBC
HCT: 28.7 % — ABNORMAL LOW (ref 36.0–46.0)
HEMOGLOBIN: 9.2 g/dL — AB (ref 12.0–15.0)
MCH: 22.4 pg — AB (ref 26.0–34.0)
MCHC: 32.1 g/dL (ref 30.0–36.0)
MCV: 70 fL — ABNORMAL LOW (ref 78.0–100.0)
Platelets: 147 10*3/uL — ABNORMAL LOW (ref 150–400)
RBC: 4.1 MIL/uL (ref 3.87–5.11)
RDW: 16.4 % — ABNORMAL HIGH (ref 11.5–15.5)
WBC: 3.9 10*3/uL — ABNORMAL LOW (ref 4.0–10.5)

## 2014-08-16 LAB — GLUCOSE, CAPILLARY
GLUCOSE-CAPILLARY: 168 mg/dL — AB (ref 70–99)
GLUCOSE-CAPILLARY: 181 mg/dL — AB (ref 70–99)

## 2014-08-16 LAB — URINE CULTURE: Colony Count: >=100000

## 2014-08-16 LAB — BASIC METABOLIC PANEL
Anion gap: 10 (ref 5–15)
BUN: 12 mg/dL (ref 6–23)
CALCIUM: 9.7 mg/dL (ref 8.4–10.5)
CO2: 24 meq/L (ref 19–32)
Chloride: 104 mEq/L (ref 96–112)
Creatinine, Ser: 1.11 mg/dL — ABNORMAL HIGH (ref 0.50–1.10)
GFR calc Af Amer: 50 mL/min — ABNORMAL LOW (ref >90)
GFR calc non Af Amer: 43 mL/min — ABNORMAL LOW (ref >90)
GLUCOSE: 149 mg/dL — AB (ref 70–99)
Potassium: 4.1 mEq/L (ref 3.7–5.3)
SODIUM: 138 meq/L (ref 137–147)

## 2014-08-16 MED ORDER — CEPHALEXIN 500 MG PO CAPS: 500.0000 mg | ORAL_CAPSULE | Freq: Four times a day (QID) | ORAL | Status: DC

## 2014-08-16 NOTE — Progress Notes (Signed)
Went over discharge information with pt.   All questions answered.  IV was taken out.  Skin is clean dry and intact.  Cardiac monitor removed.  Wheeled out by the NT.

## 2014-08-16 NOTE — Discharge Summary (Signed)
Seen and examined on the day of DC.  Agree with Dr. Milagros Reap documentation and management.  I believe she had a viral gastroenteritis as the cause of her transient nausea.  Whatever - she no longer has nausea.

## 2014-08-16 NOTE — Discharge Instructions (Signed)
1. For your clot in your right calf: Apply warm compresses/heating pad several times a day, apply Voltaren cream twice a day as needed and elevate leg above waist. Follow-up with PCP and discuss continued Tamoxifen with Oncologist.  2. We Stopped your Cozaar, Norvasc and Lasix while you were dehydrated. Your primary care doctor will let you know if you should restart them 3. Take Keflex four time a day for 5 days for your urinary tract infection  Urinary Tract Infection A urinary tract infection (UTI) can occur any place along the urinary tract. The tract includes the kidneys, ureters, bladder, and urethra. A type of germ called bacteria often causes a UTI. UTIs are often helped with antibiotic medicine.  HOME CARE   If given, take antibiotics as told by your doctor. Finish them even if you start to feel better.  Drink enough fluids to keep your pee (urine) clear or pale yellow.  Avoid tea, drinks with caffeine, and bubbly (carbonated) drinks.  Pee often. Avoid holding your pee in for a long time.  Pee before and after having sex (intercourse).  Wipe from front to back after you poop (bowel movement) if you are a woman. Use each tissue only once. GET HELP RIGHT AWAY IF:   You have back pain.  You have lower belly (abdominal) pain.  You have chills.  You feel sick to your stomach (nauseous).  You throw up (vomit).  Your burning or discomfort with peeing does not go away.  You have a fever.  Your symptoms are not better in 3 days. MAKE SURE YOU:   Understand these instructions.  Will watch your condition.  Will get help right away if you are not doing well or get worse. Document Released: 04/17/2008 Document Revised: 07/24/2012 Document Reviewed: 05/30/2012 [facility] Patient Information 2015 [place], [place]. This information is not intended to replace advice given to you by your health care provider. Make sure you discuss any questions you have with your health care  provider.

## 2014-08-19 ENCOUNTER — Ambulatory Visit (INDEPENDENT_AMBULATORY_CARE_PROVIDER_SITE_OTHER): Payer: Medicare Other | Admitting: Family Medicine

## 2014-08-19 ENCOUNTER — Encounter: Payer: Self-pay | Admitting: Family Medicine

## 2014-08-19 VITALS — BP 145/65 | HR 87 | Temp 98.4°F | Wt 120.0 lb

## 2014-08-19 DIAGNOSIS — I82811 Embolism and thrombosis of superficial veins of right lower extremities: Secondary | ICD-10-CM | POA: Diagnosis not present

## 2014-08-19 DIAGNOSIS — I10 Essential (primary) hypertension: Secondary | ICD-10-CM

## 2014-08-19 DIAGNOSIS — D649 Anemia, unspecified: Secondary | ICD-10-CM | POA: Diagnosis not present

## 2014-08-19 DIAGNOSIS — D509 Iron deficiency anemia, unspecified: Secondary | ICD-10-CM

## 2014-08-19 DIAGNOSIS — E1165 Type 2 diabetes mellitus with hyperglycemia: Secondary | ICD-10-CM | POA: Diagnosis not present

## 2014-08-19 DIAGNOSIS — IMO0001 Reserved for inherently not codable concepts without codable children: Secondary | ICD-10-CM

## 2014-08-19 DIAGNOSIS — D638 Anemia in other chronic diseases classified elsewhere: Secondary | ICD-10-CM | POA: Insufficient documentation

## 2014-08-19 DIAGNOSIS — R112 Nausea with vomiting, unspecified: Secondary | ICD-10-CM | POA: Diagnosis not present

## 2014-08-19 MED ORDER — ONDANSETRON HCL 4 MG PO TABS: 4.0000 mg | ORAL_TABLET | Freq: Three times a day (TID) | ORAL | Status: DC | PRN

## 2014-08-19 MED ORDER — METFORMIN HCL 500 MG PO TABS: 1000.0000 mg | ORAL_TABLET | Freq: Two times a day (BID) | ORAL | Status: DC

## 2014-08-19 NOTE — Progress Notes (Signed)
   Subjective:    Patient ID: [name], female    DOB: 10-13-1924, 80 y.o.   MRN: [id number]  Seen for Hospital follow-up  She continues to endorse occasional nausea, but denies any vomiting. Eating and drinking well. Denies ab pain, melena or hematochezia. She has been using warm compresses and elevated her leg for superficial venous thrombosis treatment. She spoke with her oncologist you advised her to continue tamoxifen. She denies any CP or SOB. She has been taking her Keflex and denies any dysuria.    Review of Systems   See HPI for ROS. Objective:  BP 145/65  Pulse 87  Temp(Src) 98.4 F (36.9 C) (Oral)  Wt 120 lb (54.432 kg)  General: NAD Cardiac: RRR, normal heart sounds, no murmurs. 2+ radial and PT pulses bilaterally Respiratory: CTAB, normal effort Abdomen: soft, nontender, nondistended, no hepatic or splenomegaly. Bowel sounds present Extremities: no edema or cyanosis. WWP. Negative LE swelling; Negative homan's  Skin: warm and dry, no rashes noted     Assessment & Plan:  See Problem List Documentation

## 2014-08-19 NOTE — Assessment & Plan Note (Signed)
Nausea improved but still has occasionally ; No vomiting - Zofran # 20 given - advised her to call if nausea got worse

## 2014-08-19 NOTE — Assessment & Plan Note (Signed)
Last A1c 9; no hypoglycemia episodes - Inc Metformin to 1000mg  bid - consider changing to XR formula if able to tolerate 1000 mg dose

## 2014-08-19 NOTE — Assessment & Plan Note (Signed)
Discover at hospitalization 08/2014; Denies CP or SOB, no Hx of DVT/PE but Hx of Colon and Breast CA - She spoke with her oncologist to advised her to continue tamoxifen  - Continue warm compress and elevation, along with ASA - Voltaren cream

## 2014-08-19 NOTE — Patient Instructions (Signed)
It was great seeing you today.   1. I will check some blood work today and call you if anything is abnormal 2. Increase your Metformin to 2 pills twice a day 3. Continue taking Coreg 12.5mg  twice a day for your blood pressure 4. DO NOT TAKE: COZAAR, NORVASC, OR LASIX for now. You can keep these in case we need to restart them 5. Use Zofran for nausea, but call if it gets worse   Please bring all your medications to every doctors visit  Sign up for My Chart to have easy access to your labs results, and communication with your Primary care physician.  Next Appointment  Please make an appointment with Dr Lorenso Courier in 1 month to discuss your diabetes and nausea  I look forward to talking with you again at our next visit. If you have any questions or concerns before then, please call the clinic at [phone].  Take Care,   Dr Phill Myron

## 2014-08-19 NOTE — Assessment & Plan Note (Signed)
Previous on iron but hasn't taking in over a year; Hx of colon CA; Denis melena or hematochezia  - check cbc, iron, TIBC, and ferritin - consider restarting iron if deficient and repeat colonoscopy given colon CA hx

## 2014-08-19 NOTE — Assessment & Plan Note (Signed)
BP well controlled today on Coreg 12.5 BID;  Did not restart Cozaar, Norvasc or lasix - Advised to hold in case need to restart in the future - Check BMP as Cr was increased at hospital discharge

## 2014-08-20 LAB — BASIC METABOLIC PANEL
BUN: 14 mg/dL (ref 6–23)
CALCIUM: 10 mg/dL (ref 8.4–10.5)
CO2: 26 mEq/L (ref 19–32)
Chloride: 105 mEq/L (ref 96–112)
Creat: 1.05 mg/dL (ref 0.50–1.10)
Glucose, Bld: 264 mg/dL — ABNORMAL HIGH (ref 70–99)
Potassium: 3.8 mEq/L (ref 3.5–5.3)
SODIUM: 141 meq/L (ref 135–145)

## 2014-08-20 LAB — CBC
HCT: 32.1 % — ABNORMAL LOW (ref 36.0–46.0)
Hemoglobin: 9.7 g/dL — ABNORMAL LOW (ref 12.0–15.0)
MCH: 22 pg — ABNORMAL LOW (ref 26.0–34.0)
MCHC: 30.2 g/dL (ref 30.0–36.0)
MCV: 72.8 fL — ABNORMAL LOW (ref 78.0–100.0)
PLATELETS: 193 10*3/uL (ref 150–400)
RBC: 4.41 MIL/uL (ref 3.87–5.11)
RDW: 18.1 % — ABNORMAL HIGH (ref 11.5–15.5)
WBC: 3.3 10*3/uL — ABNORMAL LOW (ref 4.0–10.5)

## 2014-08-20 LAB — IRON AND TIBC
%SAT: 29 % (ref 20–55)
IRON: 78 ug/dL (ref 42–145)
TIBC: 272 ug/dL (ref 250–470)
UIBC: 194 ug/dL (ref 125–400)

## 2014-08-20 LAB — FERRITIN: Ferritin: 421 ng/mL — ABNORMAL HIGH (ref 10–291)

## 2014-08-25 ENCOUNTER — Other Ambulatory Visit: Payer: Self-pay | Admitting: *Deleted

## 2014-08-25 MED ORDER — RANITIDINE HCL 150 MG PO CAPS: 150.0000 mg | ORAL_CAPSULE | Freq: Two times a day (BID) | ORAL | Status: DC | PRN

## 2014-08-26 ENCOUNTER — Ambulatory Visit (INDEPENDENT_AMBULATORY_CARE_PROVIDER_SITE_OTHER): Payer: Medicare Other | Admitting: Podiatry

## 2014-08-26 ENCOUNTER — Encounter: Payer: Self-pay | Admitting: Family Medicine

## 2014-08-26 DIAGNOSIS — M79676 Pain in unspecified toe(s): Secondary | ICD-10-CM

## 2014-08-26 DIAGNOSIS — IMO0001 Reserved for inherently not codable concepts without codable children: Secondary | ICD-10-CM

## 2014-08-26 DIAGNOSIS — B351 Tinea unguium: Secondary | ICD-10-CM | POA: Diagnosis not present

## 2014-08-26 DIAGNOSIS — E1165 Type 2 diabetes mellitus with hyperglycemia: Principal | ICD-10-CM

## 2014-08-26 MED ORDER — GLUCOSE BLOOD VI STRP: ORAL_STRIP | Status: DC

## 2014-08-26 NOTE — Progress Notes (Signed)
Patient ID: [name], female   DOB: [birth year], 80 y.o.   MRN: [id number]  Subjective: This patient presents today complaining of painful toenails. Patient was seen previously for diabetic peripheral neuropathy that has responded to gabapentin which is tolerating without a complaint from the medication.  Objective: The toenails are elongated, hypertrophic, discolored, incurvated 6-10  Assessment: Symptomatic onychomycoses 6-10  Plan: Debrided toenails x10 without a bleeding  Reappoint x3 months

## 2014-08-26 NOTE — Progress Notes (Signed)
Pt's daughter stated that Jodette needs prescription for blood glucose test strips sent to pharmacy best number to call is [phone] and I offered to make an apt but daughter didn't want to at this time.

## 2014-08-26 NOTE — Addendum Note (Signed)
Addended by: [name] on: 08/26/2014 04:51 PM   Modules accepted: Orders

## 2014-09-02 ENCOUNTER — Other Ambulatory Visit: Payer: Self-pay | Admitting: *Deleted

## 2014-09-03 ENCOUNTER — Other Ambulatory Visit: Payer: Self-pay | Admitting: *Deleted

## 2014-09-03 MED ORDER — ROSUVASTATIN CALCIUM 20 MG PO TABS: 20.0000 mg | ORAL_TABLET | Freq: Every day | ORAL | Status: DC

## 2014-09-03 MED ORDER — LOSARTAN POTASSIUM 50 MG PO TABS: 50.0000 mg | ORAL_TABLET | Freq: Every day | ORAL | Status: DC

## 2014-09-03 NOTE — Telephone Encounter (Signed)
Please let the patient know her Rx for losartan was refilled. If she begins to have light-headedness or dizziness with standing, please have her call into the clinic to make a BP check appointment.  [name], MD [facility] Family Medicine Resident  09/03/2014, 12:43 AM

## 2014-09-03 NOTE — Telephone Encounter (Signed)
Please let the patient know that I refilled her Rx for Crestor.  Thanks, [name], MD [facility] Family Medicine Resident  09/03/2014, 8:00 PM

## 2014-09-04 ENCOUNTER — Encounter: Payer: Self-pay | Admitting: Family Medicine

## 2014-09-16 ENCOUNTER — Encounter: Payer: Self-pay | Admitting: Family Medicine

## 2014-09-16 NOTE — Progress Notes (Signed)
Placed in PCP box.

## 2014-09-16 NOTE — Progress Notes (Signed)
Patient dropped forms to be mailed to [name] when completed.

## 2014-09-17 NOTE — Progress Notes (Signed)
Paperwork completed, placed in Tamika's box.  Thanks, [name], MD [facility] Family Medicine Resident  09/17/2014, 11:47 AM

## 2014-09-18 ENCOUNTER — Inpatient Hospital Stay (HOSPITAL_COMMUNITY)
Admission: EM | Admit: 2014-09-18 | Discharge: 2014-09-23 | DRG: 304 | Disposition: A | Discharge status: Home / Self Care | Payer: Medicare Other | Attending: Family Medicine | Admitting: Family Medicine

## 2014-09-18 ENCOUNTER — Encounter (HOSPITAL_COMMUNITY): Payer: Self-pay | Admitting: Vascular Surgery

## 2014-09-18 DIAGNOSIS — N3000 Acute cystitis without hematuria: Secondary | ICD-10-CM | POA: Diagnosis not present

## 2014-09-18 DIAGNOSIS — I2510 Atherosclerotic heart disease of native coronary artery without angina pectoris: Secondary | ICD-10-CM | POA: Diagnosis present

## 2014-09-18 DIAGNOSIS — Z9013 Acquired absence of bilateral breasts and nipples: Secondary | ICD-10-CM | POA: Diagnosis present

## 2014-09-18 DIAGNOSIS — I739 Peripheral vascular disease, unspecified: Secondary | ICD-10-CM | POA: Diagnosis present

## 2014-09-18 DIAGNOSIS — G9340 Encephalopathy, unspecified: Secondary | ICD-10-CM | POA: Diagnosis present

## 2014-09-18 DIAGNOSIS — J45909 Unspecified asthma, uncomplicated: Secondary | ICD-10-CM | POA: Diagnosis present

## 2014-09-18 DIAGNOSIS — M1990 Unspecified osteoarthritis, unspecified site: Secondary | ICD-10-CM | POA: Diagnosis present

## 2014-09-18 DIAGNOSIS — M79651 Pain in right thigh: Secondary | ICD-10-CM | POA: Diagnosis present

## 2014-09-18 DIAGNOSIS — M25579 Pain in unspecified ankle and joints of unspecified foot: Secondary | ICD-10-CM | POA: Diagnosis not present

## 2014-09-18 DIAGNOSIS — M79609 Pain in unspecified limb: Secondary | ICD-10-CM | POA: Diagnosis not present

## 2014-09-18 DIAGNOSIS — N390 Urinary tract infection, site not specified: Secondary | ICD-10-CM | POA: Diagnosis present

## 2014-09-18 DIAGNOSIS — R42 Dizziness and giddiness: Secondary | ICD-10-CM

## 2014-09-18 DIAGNOSIS — I959 Hypotension, unspecified: Secondary | ICD-10-CM | POA: Diagnosis present

## 2014-09-18 DIAGNOSIS — Z96659 Presence of unspecified artificial knee joint: Secondary | ICD-10-CM | POA: Diagnosis present

## 2014-09-18 DIAGNOSIS — Z79810 Long term (current) use of selective estrogen receptor modulators (SERMs): Secondary | ICD-10-CM

## 2014-09-18 DIAGNOSIS — E1140 Type 2 diabetes mellitus with diabetic neuropathy, unspecified: Secondary | ICD-10-CM | POA: Diagnosis present

## 2014-09-18 DIAGNOSIS — I10 Essential (primary) hypertension: Secondary | ICD-10-CM | POA: Diagnosis not present

## 2014-09-18 DIAGNOSIS — Z91010 Allergy to peanuts: Secondary | ICD-10-CM | POA: Diagnosis not present

## 2014-09-18 DIAGNOSIS — M79604 Pain in right leg: Secondary | ICD-10-CM | POA: Insufficient documentation

## 2014-09-18 DIAGNOSIS — F17220 Nicotine dependence, chewing tobacco, uncomplicated: Secondary | ICD-10-CM | POA: Diagnosis present

## 2014-09-18 DIAGNOSIS — I160 Hypertensive urgency: Secondary | ICD-10-CM

## 2014-09-18 DIAGNOSIS — B9620 Unspecified Escherichia coli [E. coli] as the cause of diseases classified elsewhere: Secondary | ICD-10-CM | POA: Diagnosis present

## 2014-09-18 DIAGNOSIS — Z86010 Personal history of colonic polyps: Secondary | ICD-10-CM | POA: Diagnosis not present

## 2014-09-18 DIAGNOSIS — I5022 Chronic systolic (congestive) heart failure: Secondary | ICD-10-CM | POA: Diagnosis not present

## 2014-09-18 DIAGNOSIS — Z7982 Long term (current) use of aspirin: Secondary | ICD-10-CM | POA: Diagnosis not present

## 2014-09-18 DIAGNOSIS — K219 Gastro-esophageal reflux disease without esophagitis: Secondary | ICD-10-CM | POA: Diagnosis present

## 2014-09-18 DIAGNOSIS — Z85038 Personal history of other malignant neoplasm of large intestine: Secondary | ICD-10-CM

## 2014-09-18 DIAGNOSIS — R404 Transient alteration of awareness: Secondary | ICD-10-CM | POA: Diagnosis not present

## 2014-09-18 DIAGNOSIS — M109 Gout, unspecified: Secondary | ICD-10-CM | POA: Diagnosis present

## 2014-09-18 DIAGNOSIS — I5042 Chronic combined systolic (congestive) and diastolic (congestive) heart failure: Secondary | ICD-10-CM | POA: Diagnosis present

## 2014-09-18 DIAGNOSIS — Z853 Personal history of malignant neoplasm of breast: Secondary | ICD-10-CM | POA: Diagnosis not present

## 2014-09-18 DIAGNOSIS — M79605 Pain in left leg: Secondary | ICD-10-CM

## 2014-09-18 DIAGNOSIS — Z66 Do not resuscitate: Secondary | ICD-10-CM | POA: Diagnosis present

## 2014-09-18 DIAGNOSIS — E785 Hyperlipidemia, unspecified: Secondary | ICD-10-CM | POA: Diagnosis present

## 2014-09-18 DIAGNOSIS — I161 Hypertensive emergency: Secondary | ICD-10-CM | POA: Diagnosis present

## 2014-09-18 DIAGNOSIS — R269 Unspecified abnormalities of gait and mobility: Secondary | ICD-10-CM | POA: Diagnosis not present

## 2014-09-18 DIAGNOSIS — R05 Cough: Secondary | ICD-10-CM | POA: Diagnosis not present

## 2014-09-18 DIAGNOSIS — R509 Fever, unspecified: Secondary | ICD-10-CM | POA: Diagnosis not present

## 2014-09-18 NOTE — ED Notes (Signed)
Pt reports to the ED for eval of dizziness since 10/27. Pt was seen on scene by EMS on 10/27 but she refused tranport. Upon EMS arrival pt was hypertensive at 230/100. Pt was NSR with a 1st degree block on the 12 lead en route. Pt lethargic but answers questions appropriately. Pt reports she had her BP medications changed approx 1 month ago. No neuro deficits noted. Pt has not been seen by her PCP but she has an appt next month. Pt denies any CP, HA, or SOB. Pt reports nausea and several episodes of "spitting up." Reports decreased PO intake. Resp e/u and skin warm and dry.

## 2014-09-18 NOTE — Progress Notes (Signed)
Pt informed that forms were mailed out 09/17/2014 to [employer] and forms were also copied for pt's record, mailed to pt's home address. Derl Barrow, RN

## 2014-09-18 NOTE — ED Notes (Signed)
Pt reports she takes medications for blood pressure but has not taken them tonight. Also during assessment pt kept complaining of feeling dizzy in the back of her head.

## 2014-09-19 ENCOUNTER — Emergency Department (HOSPITAL_COMMUNITY): Payer: Medicare Other

## 2014-09-19 ENCOUNTER — Encounter (HOSPITAL_COMMUNITY): Payer: Self-pay | Admitting: *Deleted

## 2014-09-19 DIAGNOSIS — R42 Dizziness and giddiness: Secondary | ICD-10-CM | POA: Diagnosis not present

## 2014-09-19 DIAGNOSIS — M79609 Pain in unspecified limb: Secondary | ICD-10-CM | POA: Diagnosis not present

## 2014-09-19 DIAGNOSIS — Z9013 Acquired absence of bilateral breasts and nipples: Secondary | ICD-10-CM | POA: Diagnosis present

## 2014-09-19 DIAGNOSIS — M25579 Pain in unspecified ankle and joints of unspecified foot: Secondary | ICD-10-CM | POA: Diagnosis not present

## 2014-09-19 DIAGNOSIS — N390 Urinary tract infection, site not specified: Secondary | ICD-10-CM | POA: Diagnosis present

## 2014-09-19 DIAGNOSIS — Z91010 Allergy to peanuts: Secondary | ICD-10-CM | POA: Diagnosis not present

## 2014-09-19 DIAGNOSIS — Z66 Do not resuscitate: Secondary | ICD-10-CM | POA: Diagnosis present

## 2014-09-19 DIAGNOSIS — J45909 Unspecified asthma, uncomplicated: Secondary | ICD-10-CM | POA: Diagnosis present

## 2014-09-19 DIAGNOSIS — I5042 Chronic combined systolic (congestive) and diastolic (congestive) heart failure: Secondary | ICD-10-CM | POA: Diagnosis present

## 2014-09-19 DIAGNOSIS — I10 Essential (primary) hypertension: Secondary | ICD-10-CM | POA: Diagnosis not present

## 2014-09-19 DIAGNOSIS — E785 Hyperlipidemia, unspecified: Secondary | ICD-10-CM | POA: Diagnosis present

## 2014-09-19 DIAGNOSIS — I5022 Chronic systolic (congestive) heart failure: Secondary | ICD-10-CM | POA: Diagnosis not present

## 2014-09-19 DIAGNOSIS — G9340 Encephalopathy, unspecified: Secondary | ICD-10-CM | POA: Diagnosis not present

## 2014-09-19 DIAGNOSIS — N3000 Acute cystitis without hematuria: Secondary | ICD-10-CM | POA: Diagnosis not present

## 2014-09-19 DIAGNOSIS — Z7982 Long term (current) use of aspirin: Secondary | ICD-10-CM | POA: Diagnosis not present

## 2014-09-19 DIAGNOSIS — K219 Gastro-esophageal reflux disease without esophagitis: Secondary | ICD-10-CM | POA: Diagnosis present

## 2014-09-19 DIAGNOSIS — M79604 Pain in right leg: Secondary | ICD-10-CM | POA: Diagnosis not present

## 2014-09-19 DIAGNOSIS — I2510 Atherosclerotic heart disease of native coronary artery without angina pectoris: Secondary | ICD-10-CM | POA: Diagnosis present

## 2014-09-19 DIAGNOSIS — I739 Peripheral vascular disease, unspecified: Secondary | ICD-10-CM | POA: Diagnosis present

## 2014-09-19 DIAGNOSIS — R509 Fever, unspecified: Secondary | ICD-10-CM | POA: Diagnosis not present

## 2014-09-19 DIAGNOSIS — B9620 Unspecified Escherichia coli [E. coli] as the cause of diseases classified elsewhere: Secondary | ICD-10-CM | POA: Diagnosis present

## 2014-09-19 DIAGNOSIS — M109 Gout, unspecified: Secondary | ICD-10-CM | POA: Diagnosis present

## 2014-09-19 DIAGNOSIS — Z96659 Presence of unspecified artificial knee joint: Secondary | ICD-10-CM | POA: Diagnosis present

## 2014-09-19 DIAGNOSIS — F17220 Nicotine dependence, chewing tobacco, uncomplicated: Secondary | ICD-10-CM | POA: Diagnosis present

## 2014-09-19 DIAGNOSIS — I959 Hypotension, unspecified: Secondary | ICD-10-CM | POA: Diagnosis present

## 2014-09-19 DIAGNOSIS — Z86010 Personal history of colonic polyps: Secondary | ICD-10-CM | POA: Diagnosis not present

## 2014-09-19 DIAGNOSIS — Z853 Personal history of malignant neoplasm of breast: Secondary | ICD-10-CM | POA: Diagnosis not present

## 2014-09-19 DIAGNOSIS — M1990 Unspecified osteoarthritis, unspecified site: Secondary | ICD-10-CM | POA: Diagnosis present

## 2014-09-19 DIAGNOSIS — Z79810 Long term (current) use of selective estrogen receptor modulators (SERMs): Secondary | ICD-10-CM | POA: Diagnosis not present

## 2014-09-19 DIAGNOSIS — E1140 Type 2 diabetes mellitus with diabetic neuropathy, unspecified: Secondary | ICD-10-CM | POA: Diagnosis present

## 2014-09-19 DIAGNOSIS — R269 Unspecified abnormalities of gait and mobility: Secondary | ICD-10-CM | POA: Diagnosis not present

## 2014-09-19 DIAGNOSIS — Z85038 Personal history of other malignant neoplasm of large intestine: Secondary | ICD-10-CM | POA: Diagnosis not present

## 2014-09-19 DIAGNOSIS — I161 Hypertensive emergency: Secondary | ICD-10-CM | POA: Diagnosis present

## 2014-09-19 DIAGNOSIS — R05 Cough: Secondary | ICD-10-CM | POA: Diagnosis not present

## 2014-09-19 DIAGNOSIS — M79651 Pain in right thigh: Secondary | ICD-10-CM | POA: Diagnosis present

## 2014-09-19 LAB — CBC WITH DIFFERENTIAL/PLATELET
Basophils Absolute: 0.1 10*3/uL (ref 0.0–0.1)
Basophils Relative: 1 % (ref 0–1)
EOS ABS: 0.1 10*3/uL (ref 0.0–0.7)
Eosinophils Relative: 2 % (ref 0–5)
HCT: 30.8 % — ABNORMAL LOW (ref 36.0–46.0)
Hemoglobin: 9.7 g/dL — ABNORMAL LOW (ref 12.0–15.0)
Lymphocytes Relative: 28 % (ref 12–46)
Lymphs Abs: 1.4 10*3/uL (ref 0.7–4.0)
MCH: 21.8 pg — AB (ref 26.0–34.0)
MCHC: 31.5 g/dL (ref 30.0–36.0)
MCV: 69.2 fL — ABNORMAL LOW (ref 78.0–100.0)
MONO ABS: 0.4 10*3/uL (ref 0.1–1.0)
Monocytes Relative: 8 % (ref 3–12)
Neutro Abs: 3.1 10*3/uL (ref 1.7–7.7)
Neutrophils Relative %: 61 % (ref 43–77)
PLATELETS: 151 10*3/uL (ref 150–400)
RBC: 4.45 MIL/uL (ref 3.87–5.11)
RDW: 15.8 % — ABNORMAL HIGH (ref 11.5–15.5)
WBC: 5.1 10*3/uL (ref 4.0–10.5)

## 2014-09-19 LAB — URINALYSIS, ROUTINE W REFLEX MICROSCOPIC
BILIRUBIN URINE: NEGATIVE
Glucose, UA: NEGATIVE mg/dL
Hgb urine dipstick: NEGATIVE
KETONES UR: NEGATIVE mg/dL
NITRITE: NEGATIVE
Protein, ur: 30 mg/dL — AB
Specific Gravity, Urine: 1.011 (ref 1.005–1.030)
UROBILINOGEN UA: 0.2 mg/dL (ref 0.0–1.0)
pH: 7 (ref 5.0–8.0)

## 2014-09-19 LAB — GLUCOSE, CAPILLARY
Glucose-Capillary: 159 mg/dL — ABNORMAL HIGH (ref 70–99)
Glucose-Capillary: 159 mg/dL — ABNORMAL HIGH (ref 70–99)
Glucose-Capillary: 146 mg/dL — ABNORMAL HIGH (ref 70–99)

## 2014-09-19 LAB — URINE MICROSCOPIC-ADD ON

## 2014-09-19 LAB — BASIC METABOLIC PANEL
Anion gap: 13 (ref 5–15)
BUN: 16 mg/dL (ref 6–23)
CO2: 23 mEq/L (ref 19–32)
Calcium: 10.4 mg/dL (ref 8.4–10.5)
Chloride: 98 mEq/L (ref 96–112)
Creatinine, Ser: 1.06 mg/dL (ref 0.50–1.10)
GFR calc non Af Amer: 45 mL/min — ABNORMAL LOW (ref >90)
GFR, EST AFRICAN AMERICAN: 53 mL/min — AB (ref >90)
GLUCOSE: 142 mg/dL — AB (ref 70–99)
POTASSIUM: 4.1 meq/L (ref 3.7–5.3)
SODIUM: 134 meq/L — AB (ref 137–147)

## 2014-09-19 LAB — I-STAT TROPONIN, ED: TROPONIN I, POC: 0 ng/mL (ref 0.00–0.08)

## 2014-09-19 MED ORDER — GABAPENTIN 300 MG PO CAPS
300.0000 mg | ORAL_CAPSULE | Freq: Two times a day (BID) | ORAL | Status: DC
  Administered 2014-09-19 – 2014-09-23 (×9): 300 mg via ORAL
  Filled 2014-09-19 (×10): qty 1

## 2014-09-19 MED ORDER — INSULIN ASPART 100 UNIT/ML ~~LOC~~ SOLN
0.0000 [IU] | Freq: Three times a day (TID) | SUBCUTANEOUS | Status: DC
  Administered 2014-09-19: 2 [IU] via SUBCUTANEOUS
  Administered 2014-09-19: 1 [IU] via SUBCUTANEOUS
  Administered 2014-09-19 – 2014-09-20 (×2): 2 [IU] via SUBCUTANEOUS
  Administered 2014-09-20 – 2014-09-21 (×3): 1 [IU] via SUBCUTANEOUS
  Administered 2014-09-21: 2 [IU] via SUBCUTANEOUS
  Administered 2014-09-21 – 2014-09-22 (×2): 3 [IU] via SUBCUTANEOUS

## 2014-09-19 MED ORDER — MIRTAZAPINE 15 MG PO TABS
15.0000 mg | ORAL_TABLET | Freq: Every day | ORAL | Status: DC
  Administered 2014-09-19 – 2014-09-22 (×4): 15 mg via ORAL
  Filled 2014-09-19 (×5): qty 1

## 2014-09-19 MED ORDER — ACETAMINOPHEN 325 MG PO TABS
650.0000 mg | ORAL_TABLET | Freq: Four times a day (QID) | ORAL | Status: DC | PRN
  Administered 2014-09-20 – 2014-09-22 (×4): 650 mg via ORAL
  Filled 2014-09-19 (×5): qty 2

## 2014-09-19 MED ORDER — LABETALOL HCL 5 MG/ML IV SOLN
10.0000 mg | Freq: Once | INTRAVENOUS | Status: AC
  Administered 2014-09-19: 10 mg via INTRAVENOUS
  Filled 2014-09-19: qty 4

## 2014-09-19 MED ORDER — AMLODIPINE BESYLATE 10 MG PO TABS
10.0000 mg | ORAL_TABLET | Freq: Every day | ORAL | Status: DC
  Administered 2014-09-19 – 2014-09-20 (×2): 10 mg via ORAL
  Filled 2014-09-19 (×3): qty 1

## 2014-09-19 MED ORDER — ASPIRIN 81 MG PO CHEW
81.0000 mg | CHEWABLE_TABLET | Freq: Every day | ORAL | Status: DC
Start: 2014-09-19 — End: 2014-09-23
  Administered 2014-09-19 – 2014-09-22 (×4): 81 mg via ORAL
  Filled 2014-09-19 (×2): qty 1

## 2014-09-19 MED ORDER — HEPARIN SODIUM (PORCINE) 5000 UNIT/ML IJ SOLN
5000.0000 [IU] | Freq: Three times a day (TID) | INTRAMUSCULAR | Status: DC
  Administered 2014-09-19 – 2014-09-23 (×13): 5000 [IU] via SUBCUTANEOUS
  Filled 2014-09-19 (×15): qty 1

## 2014-09-19 MED ORDER — INSULIN ASPART 100 UNIT/ML ~~LOC~~ SOLN
0.0000 [IU] | Freq: Every day | SUBCUTANEOUS | Status: DC
  Administered 2014-09-21: 4 [IU] via SUBCUTANEOUS

## 2014-09-19 MED ORDER — SODIUM CHLORIDE 0.9 % IJ SOLN
3.0000 mL | Freq: Two times a day (BID) | INTRAMUSCULAR | Status: DC
  Administered 2014-09-19 – 2014-09-22 (×8): 3 mL via INTRAVENOUS

## 2014-09-19 MED ORDER — ONDANSETRON HCL 4 MG/2ML IJ SOLN
4.0000 mg | Freq: Once | INTRAMUSCULAR | Status: AC
Start: 2014-09-19 — End: 2014-09-19
  Administered 2014-09-19: 4 mg via INTRAVENOUS
  Filled 2014-09-19: qty 2

## 2014-09-19 MED ORDER — FAMOTIDINE 20 MG PO TABS
20.0000 mg | ORAL_TABLET | Freq: Every day | ORAL | Status: DC
  Administered 2014-09-19 – 2014-09-23 (×5): 20 mg via ORAL
  Filled 2014-09-19 (×5): qty 1

## 2014-09-19 MED ORDER — MECLIZINE HCL 25 MG PO TABS
12.5000 mg | ORAL_TABLET | Freq: Once | ORAL | Status: AC
  Administered 2014-09-19: 12.5 mg via ORAL
  Filled 2014-09-19: qty 1

## 2014-09-19 MED ORDER — CARVEDILOL 12.5 MG PO TABS
12.5000 mg | ORAL_TABLET | Freq: Two times a day (BID) | ORAL | Status: DC
  Administered 2014-09-19 – 2014-09-23 (×9): 12.5 mg via ORAL
  Filled 2014-09-19 (×11): qty 1

## 2014-09-19 MED ORDER — LOSARTAN POTASSIUM 50 MG PO TABS
50.0000 mg | ORAL_TABLET | Freq: Every day | ORAL | Status: DC
  Administered 2014-09-19 – 2014-09-21 (×3): 50 mg via ORAL
  Filled 2014-09-19 (×4): qty 1

## 2014-09-19 MED ORDER — ACETAMINOPHEN 650 MG RE SUPP: 650.0000 mg | Freq: Four times a day (QID) | RECTAL | Status: DC | PRN

## 2014-09-19 MED ORDER — ALBUTEROL SULFATE (2.5 MG/3ML) 0.083% IN NEBU: 3.0000 mL | INHALATION_SOLUTION | Freq: Four times a day (QID) | RESPIRATORY_TRACT | Status: DC | PRN

## 2014-09-19 MED ORDER — ROSUVASTATIN CALCIUM 20 MG PO TABS
20.0000 mg | ORAL_TABLET | Freq: Every day | ORAL | Status: DC
  Administered 2014-09-19 – 2014-09-22 (×4): 20 mg via ORAL
  Filled 2014-09-19 (×5): qty 1

## 2014-09-19 MED ORDER — TAMOXIFEN CITRATE 10 MG PO TABS
20.0000 mg | ORAL_TABLET | Freq: Every day | ORAL | Status: DC
  Administered 2014-09-19 – 2014-09-22 (×4): 20 mg via ORAL
  Filled 2014-09-19 (×4): qty 2

## 2014-09-19 MED ORDER — MAGNESIUM HYDROXIDE 400 MG/5ML PO SUSP: 30.0000 mL | Freq: Every day | ORAL | Status: DC | PRN

## 2014-09-19 NOTE — H&P (Signed)
[facility] Admission History and Physical Service Pager: [phone]  [name] Medical record number: [id number] Date of birth: 05-07-1924 Age: 80 y.o. Gender: female  Primary Care Provider: [name], MD Consultants: none Code Status: DNR  Chief Complaint: dizziness  Assessment and Plan: [name] is a 80 y.o. female presenting with dizziness for a week, found to have hypertensive urgency. PMH is significant for HTN, CAD, PAD, DM, asthma, GERD, recent admission for UTI.  # HTN/hypertensive emergency/dizziness: Patient with hypertensive urgency to 220s SBP in ED. Symptoms largely resolved with BP improvement. Head CT negative, EKG unchanged, troponin negative. - Continue home coreg - Restart other home BP meds (norvasc to restart later this am, losartan to start this evening) - Monitor BP with gradual restart, goal <180/110 at this point - Continue to hold lasix, monitor fluid status - Consider brain MRI later today to eval for stroke, can probably hold off if symptoms continue to resolve with BP control alone - Neurochecks q2  # CHF: No evidence of volume overload or exacerbation. Previously with EF of 25% on echo but repeat in March 2015 showed normal EF and grade 1 diastolic dysfunction - continue meds as above - Monitor volume status and restart lasix as needed  # DM: A1c 9.0 last month - hold home metformin - sensitive SSI  # CAD/PAD: - continue home ASA and crestor  FEN/GI: heart healthy/carb mod diet, SLIV, home H2 blocker Prophylaxis: SQ heparin  Disposition: Admit to family medicine teaching service, Dr. Nori Riis attending  History of Present Illness: [name] is a 80 y.o. female presenting with 1 week of dizziness and head pressure. Pt reports that she has started noticing dizziness that is worse when she lays down and turns her head to the right, it has been getting somewhat worse over the last week and  tonight became much worse so she came to the hospital. She reports she has felt a pressure in the back of her head associated with this. She has not had any sinus symptoms or fevers. She hasn't tried any treatments. She has never had this happen before.   Of note: she was recently admitted with severe n/v 2/2 UTI and taken off of her losartan, norvasc and lasix at that time.   In the ED she received a dose of labetalol and her pressure came down and her symptoms resolved.  Review Of Systems: Per HPI with the following additions: no CP, SOB Otherwise 12 point review of systems was performed and was unremarkable.  Patient Active Problem List   Diagnosis Date Noted  . Hypertensive emergency 09/19/2014  . Dizziness 09/19/2014  . Anemia 08/19/2014  . Acute superficial venous thrombosis of right lower extremity 08/19/2014  . Nausea without vomiting 08/14/2014  . Vomiting 08/14/2014  . Intractable vomiting with nausea 08/14/2014  . Right ankle pain 08/10/2014  . Hypercalcemia 06/09/2014  . Breast cancer, left breast 06/09/2014  . Breast cancer, right breast 06/09/2014  . Edema of left lower extremity 03/30/2014  . PAD (peripheral artery disease) 03/30/2014  . Colon cancer 12/11/2013  . Neck pain 10/22/2013  . Chest pain, unspecified 09/19/2013  . Bilateral neck pain 09/19/2013  . Light headed 06/16/2013  . History of fall 05/15/2013  . Right thigh pain 05/15/2013  . Memory deficit 05/15/2013  . Bilateral leg pain 03/07/2013  . Trigger thumb of right hand 03/07/2013  . Lower extremity edema 02/19/2013  . Weakness generalized 01/22/2013  .  Stricture and stenosis of esophagus 10/27/2012  . Food impaction of esophagus 10/27/2012  . Gout attack 09/13/2012  . Poor social situation 09/13/2012  . Low back pain 08/07/2012  . Musculoskeletal chest pain 07/10/2012  . Thoracic back pain 05/24/2012  . Weight loss 04/03/2012  . Insomnia 09/25/2011  . Urinary frequency 06/02/2011  . Tobacco  abuse 06/02/2011  . History of breast cancer in female, bilateral.  Mastectomies 10/24/2010. 04/26/2011  . Hiatal hernia 03/22/2011  . Colon polyp 03/22/2011  . Poor circulation 03/22/2011  . Other malaise and fatigue 03/22/2011  . 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 NEUROPATHY 08/25/2009  . ANEMIA-IRON DEFICIENCY 04/29/2007  . HYPERLIPIDEMIA 01/29/2007  . Diabetes mellitus type 2, uncontrolled, without complications 16/08/9603  . HYPERTENSION, BENIGN SYSTEMIC 01/10/2007  . CORONARY, ARTERIOSCLEROSIS 01/10/2007  . Chronic systolic heart failure 54/07/8118  . Asthma 01/10/2007  . Reflux esophagitis 01/10/2007  . ARTHRITIS 01/10/2007   Past Medical History: Past Medical History  Diagnosis Date  . Breast cancer 10/2010    Invasive Ductal Carcinoma, s/p bilateral mastectomy, now on Tamoxifen. Followed by Dr Jamse Arn.   . Diabetes mellitus   . Hypertension   . Hyperlipidemia   . Anemia     Iron def anemia  . CAD (coronary artery disease)   . CHF (congestive heart failure)     Systolic   . History of colon cancer   . Neuropathy   . Allergy   . GERD (gastroesophageal reflux disease)   . Hiatal hernia   . Diverticulosis   . Asthma   . Arthritis   . H/O: GI bleed   . Breast cancer 2011    s/p Bilateral masectomy  . Shortness of breath   . Pneumonia 03/2012  . Carotid artery aneurysm 08/2010    right ICA, 5 x 74mm  . DDD (degenerative disc disease), cervical   . Colon cancer   . Colon cancer 12/11/2013  . Myocardial infarction   . Hypercalcemia 06/09/2014   Past Surgical History: Past Surgical History  Procedure Laterality Date  . Breast masectomy  10/2010    Bilateral masectomy by Dr Lucia Gaskins s/p invasive ductal carcinoma.  . Cardiac  catherization  2007    Severe 3-vessel disease.  EF 20-25%.  . Esophagogastroduodenoscopy  2001    Esophageal Tear  . Total knee arthroplasty  2001  . Esophagogastroduodenoscopy  10/27/2012    Procedure: ESOPHAGOGASTRODUODENOSCOPY  (EGD);  Surgeon: Irene Shipper, MD;  [facility];  Service: Endoscopy;  Laterality: N/A;  pat  . Breast surgery     Social History: History  Substance Use Topics  . Smoking status: Never Smoker   . Smokeless tobacco: Current User    Types: Snuff  . Alcohol Use: No   Please also refer to relevant sections of EMR.  Family History: Family History  Problem Relation Age of Onset  . Sickle cell anemia Other   . Heart disease Mother   . Heart disease Father   . Cancer Daughter 46    breast ca  . Cancer Daughter 33    breast ca   Allergies and Medications: Allergies  Allergen Reactions  . Peanuts [Nuts] Swelling  . Lisinopril Cough   No current facility-administered medications on file prior to encounter.   Current Outpatient Prescriptions on File Prior to Encounter  Medication Sig Dispense Refill  . acetaminophen (TYLENOL) 500 MG tablet Take 1-2 tablets (500-1,000 mg total) by mouth every 8 (eight) hours as needed. 30 tablet 0  .  albuterol (PROVENTIL HFA;VENTOLIN HFA) 108 (90 BASE) MCG/ACT inhaler Inhale 2 puffs into the lungs every 6 (six) hours as needed. For shortness of breath 1 Inhaler 6  . aspirin (ASPIRIN CHILDRENS) 81 MG chewable tablet Chew 81 mg by mouth daily.      . carvedilol (COREG) 12.5 MG tablet Take 1 tablet (12.5 mg total) by mouth 2 (two) times daily with a meal. 60 tablet 2  . diclofenac sodium (VOLTAREN) 1 % GEL Apply 2 g topically 4 (four) times daily. 100 g 3  . gabapentin (NEURONTIN) 300 MG capsule Take 300 mg by mouth 2 (two) times daily.     Marland Kitchen losartan (COZAAR) 50 MG tablet Take 1 tablet (50 mg total) by mouth at bedtime. 30 tablet 2  . magnesium hydroxide (MILK OF MAGNESIA) 400 MG/5ML suspension Take 30 mLs by mouth daily as needed for mild constipation.    . metFORMIN (GLUCOPHAGE) 500 MG tablet Take 2 tablets (1,000 mg total) by mouth 2 (two) times daily with a meal. 120 tablet 1  . mirtazapine (REMERON) 15 MG tablet Take 15 mg by mouth at  bedtime as needed (sleep).    . ondansetron (ZOFRAN) 4 MG tablet Take 1 tablet (4 mg total) by mouth every 8 (eight) hours as needed for nausea or vomiting. 20 tablet 0  . PATADAY 0.2 % SOLN Place 1 drop into both eyes daily as needed. For itchy eyes    . ranitidine (ZANTAC) 150 MG capsule Take 1 capsule (150 mg total) by mouth 2 (two) times daily as needed for heartburn. 60 capsule 3  . rosuvastatin (CRESTOR) 20 MG tablet Take 1 tablet (20 mg total) by mouth at bedtime. 30 tablet 2  . tamoxifen (NOLVADEX) 20 MG tablet Take 1 tablet (20 mg total) by mouth daily. 30 tablet 4  . cephALEXin (KEFLEX) 500 MG capsule Take 1 capsule (500 mg total) by mouth 4 (four) times daily. 20 capsule 0  . colchicine 0.6 MG tablet Take 1 tablet (0.6 mg total) by mouth 2 (two) times daily. 60 tablet 5  . glucose blood test strip Use as instructed 100 each 12  . [DISCONTINUED] cetirizine (ZYRTEC) 5 MG tablet Take 1 tablet (5 mg total) by mouth daily. Take at night time as it can cause some drowsiness 30 tablet 4    Objective: BP 194/46 mmHg  Pulse 71  Temp(Src) 98.1 F (36.7 C) (Oral)  Resp 13  SpO2 99% Exam: General: frail elderly woman, lying in bed, NAD HEENT: MMM, NCAT, PERRL, EOMI Cardiovascular: RRR, 3/6 systolic murmur, unable to palpate DP pulses bilaterally Respiratory: CTAB, normal WOB Abdomen: soft, NTND, +BS Extremities: WWP, trace LE edema bilaterally Skin: no rashes, thin Neuro: alert and oriented, no focal deficits  Labs and Imaging: CBC BMET   Recent Labs Lab 09/19/14 0116  WBC 5.1  HGB 9.7*  HCT 30.8*  PLT 151    Recent Labs Lab 09/19/14 0040  NA 134*  K 4.1  CL 98  CO2 23  BUN 16  CREATININE 1.06  GLUCOSE 142*  CALCIUM 10.4    Trop neg x1   Ct Head Wo Contrast  09/19/2014   CLINICAL DATA:  Dizziness and hypertension.  EXAM: CT HEAD WITHOUT CONTRAST  TECHNIQUE: Contiguous axial images were obtained from the base of the skull through the vertex without intravenous  contrast.  COMPARISON:  PET-CT 04/08/2014  FINDINGS: No acute intracranial hemorrhage. No focal mass lesion. No CT evidence of acute infarction. No midline shift or mass  effect. No hydrocephalus. Basilar cisterns are patent.  There is generalized cortical atrophy and proportional ventricular dilatation unchanged from prior. There is extensive periventricular white matter hypodensities.  Paranasal sinuses and  mastoid air cells are clear.  IMPRESSION: 1. No acute intracranial findings. 2. Atrophy and white matter microvascular disease are not changed.   Electronically Signed   By: Suzy Bouchard M.D.   On: 09/19/2014 01:32    Frazier Richards, MD 09/19/2014, 3:36 AM PGY-2, [facility] Intern pager: [phone], text pages welcome

## 2014-09-19 NOTE — ED Notes (Signed)
MD at bedside. 

## 2014-09-19 NOTE — ED Notes (Signed)
Dr Otter at bedside  

## 2014-09-19 NOTE — ED Provider Notes (Addendum)
CSN: [id number]     Arrival date & time 09/18/14  2255 History   First MD Initiated Contact with Patient 09/19/14 0036     Chief Complaint  Patient presents with  . Dizziness  . Hypertension     (Consider location/radiation/quality/duration/timing/severity/associated sxs/prior Treatment) HPI 80 year old female presents to the emergency department from home with complaint of dizziness nausea.  Dizziness has been ongoing for over a week.  Patient reports symptoms are worse when she lays down at night, she has difficulty getting up and walking around when the dizziness comes on.  She reports she has a pressure in her head especially towards the back as well as dryness in her nose.  Patient reports throughout the day symptoms improved somewhat but then returned in the evening.  Patient was admitted to the hospital recently, and that follow-up appointment was taken off 2 of her blood pressure medicines.  Patient noted be significantly hypertensive tonight.  She denies any chest pain or shortness of breath.  She denies previous history of vertigo.  She denies any recent URI symptoms. Past Medical History  Diagnosis Date  . Breast cancer 10/2010    Invasive Ductal Carcinoma, s/p bilateral mastectomy, now on Tamoxifen. Followed by Dr Jamse Arn.   . Diabetes mellitus   . Hypertension   . Hyperlipidemia   . Anemia     Iron def anemia  . CAD (coronary artery disease)   . CHF (congestive heart failure)     Systolic   . History of colon cancer   . Neuropathy   . Allergy   . GERD (gastroesophageal reflux disease)   . Hiatal hernia   . Diverticulosis   . Asthma   . Arthritis   . H/O: GI bleed   . Breast cancer 2011    s/p Bilateral masectomy  . Shortness of breath   . Pneumonia 03/2012  . Carotid artery aneurysm 08/2010    right ICA, 5 x 45mm  . DDD (degenerative disc disease), cervical   . Colon cancer   . Colon cancer 12/11/2013  . Myocardial infarction   . Hypercalcemia 06/09/2014   Past  Surgical History  Procedure Laterality Date  . Breast masectomy  10/2010    Bilateral masectomy by Dr Lucia Gaskins s/p invasive ductal carcinoma.  . Cardiac  catherization  2007    Severe 3-vessel disease.  EF 20-25%.  . Esophagogastroduodenoscopy  2001    Esophageal Tear  . Total knee arthroplasty  2001  . Esophagogastroduodenoscopy  10/27/2012    Procedure: ESOPHAGOGASTRODUODENOSCOPY (EGD);  Surgeon: Irene Shipper, MD;  Location: [facility] ENDOSCOPY;  Service: Endoscopy;  Laterality: N/A;  pat  . Breast surgery     Family History  Problem Relation Age of Onset  . Sickle cell anemia Other   . Heart disease Mother   . Heart disease Father   . Cancer Daughter 2    breast ca  . Cancer Daughter 39    breast ca   History  Substance Use Topics  . Smoking status: Never Smoker   . Smokeless tobacco: Current User    Types: Snuff  . Alcohol Use: No   OB History    No data available     Review of Systems   See History of Present Illness; otherwise all other systems are reviewed and negative  Allergies  Peanuts and Lisinopril  Home Medications   Prior to Admission medications   Medication Sig Start Date End Date Taking? Authorizing Provider  acetaminophen (TYLENOL) 500 MG  tablet Take 1-2 tablets (500-1,000 mg total) by mouth every 8 (eight) hours as needed. 07/10/14  Yes Cordelia Poche, MD  albuterol (PROVENTIL HFA;VENTOLIN HFA) 108 (90 BASE) MCG/ACT inhaler Inhale 2 puffs into the lungs every 6 (six) hours as needed. For shortness of breath 01/31/13  Yes Kandis Nab, MD  amLODipine (NORVASC) 10 MG tablet Take 10 mg by mouth daily.  09/12/14  Yes Historical Provider, MD  aspirin (ASPIRIN CHILDRENS) 81 MG chewable tablet Chew 81 mg by mouth daily.     Yes Historical Provider, MD  carvedilol (COREG) 12.5 MG tablet Take 1 tablet (12.5 mg total) by mouth 2 (two) times daily with a meal. 07/10/14  Yes [name], MD  diclofenac sodium (VOLTAREN) 1 % GEL Apply 2 g topically 4 (four) times  daily. 08/10/14  Yes Timmothy Euler, MD  furosemide (LASIX) 20 MG tablet Take 20 mg by mouth daily.  09/12/14  Yes Historical Provider, MD  gabapentin (NEURONTIN) 300 MG capsule Take 300 mg by mouth 2 (two) times daily.    Yes Historical Provider, MD  losartan (COZAAR) 50 MG tablet Take 1 tablet (50 mg total) by mouth at bedtime. 09/03/14  Yes [name], MD  magnesium hydroxide (MILK OF MAGNESIA) 400 MG/5ML suspension Take 30 mLs by mouth daily as needed for mild constipation.   Yes Historical Provider, MD  metFORMIN (GLUCOPHAGE) 500 MG tablet Take 2 tablets (1,000 mg total) by mouth 2 (two) times daily with a meal. 08/19/14  Yes Olam Idler, MD  mirtazapine (REMERON) 15 MG tablet Take 15 mg by mouth at bedtime as needed (sleep).   Yes Historical Provider, MD  ondansetron (ZOFRAN) 4 MG tablet Take 1 tablet (4 mg total) by mouth every 8 (eight) hours as needed for nausea or vomiting. 08/19/14  Yes Olam Idler, MD  PATADAY 0.2 % SOLN Place 1 drop into both eyes daily as needed. For itchy eyes 03/05/11  Yes Historical Provider, MD  ranitidine (ZANTAC) 150 MG capsule Take 1 capsule (150 mg total) by mouth 2 (two) times daily as needed for heartburn. 08/25/14  Yes [name], MD  rosuvastatin (CRESTOR) 20 MG tablet Take 1 tablet (20 mg total) by mouth at bedtime. 09/03/14  Yes [name], MD  tamoxifen (NOLVADEX) 20 MG tablet Take 1 tablet (20 mg total) by mouth daily. 03/03/14  Yes Kandis Nab, MD  cephALEXin (KEFLEX) 500 MG capsule Take 1 capsule (500 mg total) by mouth 4 (four) times daily. 08/16/14   Olam Idler, MD  colchicine 0.6 MG tablet Take 1 tablet (0.6 mg total) by mouth 2 (two) times daily. 06/24/14   [name], MD  glucose blood test strip Use as instructed 08/26/14   [name], MD   BP 220/83 mmHg  Pulse 83  Temp(Src) 98.1 F (36.7 C) (Oral)  Resp 26  SpO2 97% Physical Exam  Constitutional: She is oriented to person, place, and time. She  appears well-developed and well-nourished. She appears distressed (frail, uncomfortable appearing).  HENT:  Head: Normocephalic and atraumatic.  Nose: Nose normal.  Mouth/Throat: Oropharynx is clear and moist.  Eyes: Conjunctivae and EOM are normal. Pupils are equal, round, and reactive to light.  Neck: Normal range of motion. Neck supple. No JVD present. No tracheal deviation present. No thyromegaly present.  Cardiovascular: Normal rate, regular rhythm, normal heart sounds and intact distal pulses.  Exam reveals no gallop and no friction rub.   No murmur  heard. Pulmonary/Chest: Effort normal and breath sounds normal. No stridor. No respiratory distress. She has no wheezes. She has no rales. She exhibits no tenderness.  Abdominal: Soft. Bowel sounds are normal. She exhibits no distension and no mass. There is no tenderness. There is no rebound and no guarding.  Musculoskeletal: Normal range of motion. She exhibits no edema or tenderness.  Lymphadenopathy:    She has no cervical adenopathy.  Neurological: She is alert and oriented to person, place, and time. She displays normal reflexes. No cranial nerve deficit. She exhibits normal muscle tone. Coordination (patient with abnormal finger-nose-finger) abnormal.  Patient reports severe dizziness when turning her head to the right, no nystagmus noted  Skin: Skin is warm and dry. No rash noted. No erythema. No pallor.  Psychiatric: She has a normal mood and affect. Her behavior is normal. Judgment and thought content normal.  Nursing note and vitals reviewed.   ED Course  Procedures (including critical care time) Labs Review Labs Reviewed  BASIC METABOLIC PANEL - Abnormal; Notable for the following:    Sodium 134 (*)    Glucose, Bld 142 (*)    GFR calc non Af Amer 45 (*)    GFR calc Af Amer 53 (*)    All other components within normal limits  URINALYSIS, ROUTINE W REFLEX MICROSCOPIC - Abnormal; Notable for the following:    APPearance  CLOUDY (*)    Protein, ur 30 (*)    Leukocytes, UA SMALL (*)    All other components within normal limits  CBC WITH DIFFERENTIAL - Abnormal; Notable for the following:    Hemoglobin 9.7 (*)    HCT 30.8 (*)    MCV 69.2 (*)    MCH 21.8 (*)    RDW 15.8 (*)    All other components within normal limits  URINE MICROSCOPIC-ADD ON - Abnormal; Notable for the following:    Bacteria, UA FEW (*)    All other components within normal limits  CBC WITH DIFFERENTIAL  I-STAT TROPOININ, ED    Imaging Review Ct Head Wo Contrast  09/19/2014   CLINICAL DATA:  Dizziness and hypertension.  EXAM: CT HEAD WITHOUT CONTRAST  TECHNIQUE: Contiguous axial images were obtained from the base of the skull through the vertex without intravenous contrast.  COMPARISON:  PET-CT 04/08/2014  FINDINGS: No acute intracranial hemorrhage. No focal mass lesion. No CT evidence of acute infarction. No midline shift or mass effect. No hydrocephalus. Basilar cisterns are patent.  There is generalized cortical atrophy and proportional ventricular dilatation unchanged from prior. There is extensive periventricular white matter hypodensities.  Paranasal sinuses and  mastoid air cells are clear.  IMPRESSION: 1. No acute intracranial findings. 2. Atrophy and white matter microvascular disease are not changed.   Electronically Signed   By: Suzy Bouchard M.D.   On: 09/19/2014 01:32     EKG Interpretation   Date/Time:  Saturday September 19 2014 00:46:44 EST Ventricular Rate:  81 PR Interval:  201 QRS Duration: 69 QT Interval:  415 QTC Calculation: 482 R Axis:   25 Text Interpretation:  Sinus rhythm Confirmed by Margaretha Mahan  MD, Janvi Ammar ([id number]) on  09/19/2014 1:31:38 AM     CRITICAL CARE Performed by: Kalman Drape Total critical care time: 60 min Critical care time was exclusive of separately billable procedures and treating other patients. Critical care was necessary to treat or prevent imminent or life-threatening  deterioration. Critical care was time spent personally by me on the following activities: development of treatment plan  with patient and/or surrogate as well as nursing, discussions with consultants, evaluation of patient's response to treatment, examination of patient, obtaining history from patient or surrogate, ordering and performing treatments and interventions, ordering and review of laboratory studies, ordering and review of radiographic studies, pulse oximetry and re-evaluation of patient's condition.  MDM   Final diagnoses:  Dizzy  Hypertensive urgency  Vertigo    80 year old female with dizziness/vertigo hypertensive urgency.  CT scan with white matter microvascular disease unchanged from prior.  Patient feeling somewhat better with improvement in blood pressure after labetalol.  Will discuss with family medicine for admission.    Kalman Drape, MD 09/19/14 [place], MD 09/19/14 [phone]

## 2014-09-20 LAB — COMPREHENSIVE METABOLIC PANEL
ALBUMIN: 2.9 g/dL — AB (ref 3.5–5.2)
ALT: <5 U/L (ref 0–35)
ANION GAP: 13 (ref 5–15)
AST: 16 U/L (ref 0–37)
Alkaline Phosphatase: 39 U/L (ref 39–117)
BUN: 15 mg/dL (ref 6–23)
CO2: 24 mEq/L (ref 19–32)
Calcium: 10 mg/dL (ref 8.4–10.5)
Chloride: 102 mEq/L (ref 96–112)
Creatinine, Ser: 1.18 mg/dL — ABNORMAL HIGH (ref 0.50–1.10)
GFR calc Af Amer: 46 mL/min — ABNORMAL LOW (ref >90)
GFR calc non Af Amer: 40 mL/min — ABNORMAL LOW (ref >90)
Glucose, Bld: 215 mg/dL — ABNORMAL HIGH (ref 70–99)
POTASSIUM: 4.4 meq/L (ref 3.7–5.3)
Sodium: 139 mEq/L (ref 137–147)
TOTAL PROTEIN: 6.9 g/dL (ref 6.0–8.3)
Total Bilirubin: 0.3 mg/dL (ref 0.3–1.2)

## 2014-09-20 LAB — GLUCOSE, CAPILLARY
GLUCOSE-CAPILLARY: 136 mg/dL — AB (ref 70–99)
GLUCOSE-CAPILLARY: 184 mg/dL — AB (ref 70–99)
Glucose-Capillary: 147 mg/dL — ABNORMAL HIGH (ref 70–99)
Glucose-Capillary: 192 mg/dL — ABNORMAL HIGH (ref 70–99)
Glucose-Capillary: 187 mg/dL — ABNORMAL HIGH (ref 70–99)

## 2014-09-20 MED ORDER — FUROSEMIDE 20 MG PO TABS
20.0000 mg | ORAL_TABLET | Freq: Every day | ORAL | Status: DC
  Administered 2014-09-20 – 2014-09-23 (×4): 20 mg via ORAL
  Filled 2014-09-20 (×4): qty 1

## 2014-09-20 MED ORDER — ONDANSETRON 4 MG PO TBDP
4.0000 mg | ORAL_TABLET | Freq: Once | ORAL | Status: AC
  Administered 2014-09-20: 4 mg via ORAL
  Filled 2014-09-20: qty 1

## 2014-09-20 NOTE — Significant Event (Signed)
MD notified of patient n/v VSS .

## 2014-09-20 NOTE — Progress Notes (Signed)
Family Medicine Teaching Service Daily Progress Note Intern Pager: [phone]  [name] Medical record number: [id number] Date of birth: 1924-02-26 Age: 80 y.o. Gender: female  Primary Care Provider: [name], MD Consultants: none Code Status: DNR  Pt Overview and Major Events to Date:   Assessment and Plan: [name] is a 80 y.o. female presenting with dizziness for a week, found to have hypertensive urgency. PMH is significant for HTN, CAD, PAD, DM, asthma, GERD, recent admission for UTI.  # HTN/hypertensive emergency/dizziness: Patient with hypertensive urgency to 220s SBP in ED. Symptoms largely resolved with BP improvement. Head CT negative, EKG unchanged, troponin negative. Suspect now there is a component of delirium - coreg, amlodipine, losartan  - goal <150/90  - reorient as much as possible  - consider brain MRI if persistent  - OT for UOB (spoke with daughter this morning; confusion not baseline_  # CHF: No evidence of volume overload or exacerbation. Previously with EF of 25% on echo but repeat in March 2015 showed normal EF and grade 1 diastolic dysfunction - continue meds as above - Monitor volume status  - will restart lasix at home dosing; 20mg    # DM: A1c 9.0 last month - hold home metformin - sensitive SSI  # CAD/PAD: - continue home ASA and crestor  FEN/GI: heart healthy/carb mod diet, SLIV, home H2 blocker Prophylaxis: SQ heparin  Disposition: pending improvement on BPs/solidify regimen; home safety  Subjective: Somewhat confused this morning; States that she feels dizzy   Objective: Temp:  [97.8 F (36.6 C)-99.2 F (37.3 C)] 97.9 F (36.6 C) (11/08 0636) Pulse Rate:  [65-74] 74 (11/08 0636) Resp:  [16-18] 18 (11/08 0636) BP: (124-176)/(52-107) 125/107 mmHg (11/08 0636) SpO2:  [96 %-98 %] 96 % (11/08 0636) Weight:  [116 lb 6.5 oz (52.8 kg)] 116 lb 6.5 oz (52.8 kg) (11/08 0636) Physical Exam: General: frail elderly  woman, lying in bed, NAD HEENT: MMM, NCAT, PERRL, EOMI Cardiovascular: RRR, 3/6 systolic murmur, unable to palpate DP pulses bilaterally Respiratory: CTAB, normal WOB Abdomen: soft, NTND, +BS Extremities: WWP, trace LE edema bilaterally Skin: no rashes, thin Neuro: alert; oriented to self; thought year 1958, president was [employer]  Laboratory:  Recent Labs Lab 09/19/14 0116  WBC 5.1  HGB 9.7*  HCT 30.8*  PLT 151    Recent Labs Lab 09/19/14 0040  NA 134*  K 4.1  CL 98  CO2 23  BUN 16  CREATININE 1.06  CALCIUM 10.4  GLUCOSE 142*    Imaging/Diagnostic Tests: CT head 11/7 IMPRESSION: 1. No acute intracranial findings. 2. Atrophy and white matter microvascular disease are not changed.  Bernadene Bell, MD 09/20/2014, 8:39 AM PGY-2, [facility] Intern pager: [phone], text pages welcome

## 2014-09-21 ENCOUNTER — Inpatient Hospital Stay (HOSPITAL_COMMUNITY): Payer: Medicare Other

## 2014-09-21 DIAGNOSIS — R269 Unspecified abnormalities of gait and mobility: Secondary | ICD-10-CM

## 2014-09-21 DIAGNOSIS — R509 Fever, unspecified: Secondary | ICD-10-CM

## 2014-09-21 DIAGNOSIS — M25579 Pain in unspecified ankle and joints of unspecified foot: Secondary | ICD-10-CM

## 2014-09-21 DIAGNOSIS — G9340 Encephalopathy, unspecified: Secondary | ICD-10-CM | POA: Insufficient documentation

## 2014-09-21 DIAGNOSIS — I10 Essential (primary) hypertension: Secondary | ICD-10-CM | POA: Insufficient documentation

## 2014-09-21 DIAGNOSIS — M79609 Pain in unspecified limb: Secondary | ICD-10-CM

## 2014-09-21 LAB — URINE MICROSCOPIC-ADD ON

## 2014-09-21 LAB — GLUCOSE, CAPILLARY
GLUCOSE-CAPILLARY: 168 mg/dL — AB (ref 70–99)
GLUCOSE-CAPILLARY: 305 mg/dL — AB (ref 70–99)
Glucose-Capillary: 143 mg/dL — ABNORMAL HIGH (ref 70–99)
Glucose-Capillary: 223 mg/dL — ABNORMAL HIGH (ref 70–99)
Glucose-Capillary: 303 mg/dL — ABNORMAL HIGH (ref 70–99)

## 2014-09-21 LAB — URINALYSIS, ROUTINE W REFLEX MICROSCOPIC
Bilirubin Urine: NEGATIVE
Glucose, UA: NEGATIVE mg/dL
Ketones, ur: NEGATIVE mg/dL
Nitrite: POSITIVE — AB
Protein, ur: NEGATIVE mg/dL
Specific Gravity, Urine: 1.012 (ref 1.005–1.030)
UROBILINOGEN UA: 1 mg/dL (ref 0.0–1.0)
pH: 5.5 (ref 5.0–8.0)

## 2014-09-21 MED ORDER — CEPHALEXIN 500 MG PO CAPS
500.0000 mg | ORAL_CAPSULE | Freq: Two times a day (BID) | ORAL | Status: DC
  Administered 2014-09-22 – 2014-09-23 (×4): 500 mg via ORAL
  Filled 2014-09-21 (×5): qty 1

## 2014-09-21 NOTE — Discharge Summary (Signed)
[facility] Discharge Summary  [name] Medical record number: [id number] Date of birth: [birth year] Age: 80 y.o. Gender: female Date of Admission: 09/18/2014  Date of Discharge: 09/23/14 Admitting Physician: Dickie La, MD  Primary Care Provider: [name], MD Consultants: none  Indication for Hospitalization: hypertensive urgency  Discharge Diagnoses/Problem List:  Hypertensive urgency UTI Encephalopathy - likely related to UTI and delirium LE pain Chronic combined diastolic and systolic CHF [id number] CAD/PAD  Disposition: home  Discharge Condition: stable  Discharge Exam: see progress note from day of discharge  Brief Hospital Course:   [name] is a 80 y.o. female presenting with dizziness for a week, found to have hypertensive urgency. PMH is significant for HTN, CAD, PAD, DM, asthma, GERD, recent admission for UTI.  # HTN/hypertensive emergency/dizziness: Patient presenting with hypertensive urgency to 220s SBP in ED.  Head CT negative, EKG unchanged, troponin negative.  Patient previously taken off of antihypertensives in clinic for hypotension in setting of UTI.  Symptoms largely resolved with BP improvement after restarting previous outpatient meds.  After mild hypotension, amlodipine d/c'd.  Patient's BPs stabilized with home Coreg and increased Losartan dose of 100mg  daily.    #Fever: Febrile to 100.9 O/N 11/9.  Patient with AMS likely related to delirium. UA with +nitrites, large leukocytes. CXR clear. Complaining of LE pain worsening. LE dopplers without DVT.  Treated empirically for UTI with Keflex. UCx showing >100,000 colonies of E Coli, so Keflex continued.  Fever resolved after initiation of abx and delirium improved.  # T2DM: Home metformin held during admission.  Patient managed with moderate SSI.  #LE pain: Patient reporting worsening of LE pain on 11/9, R>L.  LE dopplers negative for DVT.  Home gabapentin  continued throughout hospitalization.  Hesitant to increase 2/2 age and renal function.  LE pain likely multifactorial from combination of PAD and peripheral neuropathy. Exam not consistent with gout, but home colchicine (at lower dose of 0.3mg  daily for renal function) resumed.  Home voltaren gel resumed, with great improvement of symptoms prior to discharge.  All other chronic medical conditions stable throughout admission and managed with home regimens.  D/c'd home Tamoxifen (h/o breast cancer in 2011) for risk of endometrial cancer and VTE.  Issues for Follow Up:  1. F/u BP: Home coreg continued. D/c'd amlodipine. Losartan increased to 100mg  daily. Goal <150/90. 2. F/u T2DM: Resumed home metformin on discharge.  With poor renal function, consider d/c'ing Metformin and starting another medication for diabetes control. 3. F/u UTI: Patient prescribed Keflex to finish 7 day course for UTI.   4. F/u LE pain - much improved prior to discharge with voltaren gel 5. Patient to receive [facility] PT/OT for further in home  Significant Procedures: none  Significant Labs and Imaging:   Recent Labs Lab 09/19/14 0116  WBC 5.1  HGB 9.7*  HCT 30.8*  PLT 151    Recent Labs Lab 09/19/14 0040 09/20/14 1140  NA 134* 139  K 4.1 4.4  CL 98 102  CO2 23 24  GLUCOSE 142* 215*  BUN 16 15  CREATININE 1.06 1.18*  CALCIUM 10.4 10.0  ALKPHOS  --  39  AST  --  16  ALT  --  <5  ALBUMIN  --  2.9*    Urinalysis    Component Value Date/Time   COLORURINE YELLOW 09/21/2014 1851   APPEARANCEUR TURBID* 09/21/2014 1851   LABSPEC 1.012 09/21/2014 1851   PHURINE 5.5 09/21/2014 1851  GLUCOSEU NEGATIVE 09/21/2014 1851   HGBUR MODERATE* 09/21/2014 1851   HGBUR negative 10/21/2009 1424   BILIRUBINUR NEGATIVE 09/21/2014 1851   BILIRUBINUR NEG 09/19/2013 1000   KETONESUR NEGATIVE 09/21/2014 1851   PROTEINUR NEGATIVE 09/21/2014 1851   PROTEINUR 30 09/19/2013 1000   UROBILINOGEN 1.0 09/21/2014 1851    UROBILINOGEN 0.2 09/19/2013 1000   NITRITE POSITIVE* 09/21/2014 1851   NITRITE NEG 09/19/2013 1000   LEUKOCYTESUR LARGE* 09/21/2014 1851     UCx (11/9) >100,000 colonies E Coli sensitive to Keflex.   CT head 11/7 IMPRESSION: 1. No acute intracranial findings. 2. Atrophy and white matter microvascular disease are not changed.  CXR 11/9: No active cardiopulmonary disease.  LE Dopplers 11/9: No evidence of DVT  Results/Tests Pending at Time of Discharge: none  Discharge Medications:    Medication List    STOP taking these medications        amLODipine 10 MG tablet  Commonly known as:  NORVASC     tamoxifen 20 MG tablet  Commonly known as:  NOLVADEX      TAKE these medications        acetaminophen 500 MG tablet  Commonly known as:  TYLENOL  Take 1-2 tablets (500-1,000 mg total) by mouth every 8 (eight) hours as needed.     albuterol 108 (90 BASE) MCG/ACT inhaler  Commonly known as:  PROVENTIL HFA;VENTOLIN HFA  Inhale 2 puffs into the lungs every 6 (six) hours as needed. For shortness of breath     ASPIRIN CHILDRENS 81 MG chewable tablet  Generic drug:  aspirin  Chew 81 mg by mouth daily.     carvedilol 12.5 MG tablet  Commonly known as:  COREG  Take 1 tablet (12.5 mg total) by mouth 2 (two) times daily with a meal.     cephALEXin 500 MG capsule  Commonly known as:  KEFLEX  Take 1 capsule (500 mg total) by mouth 3 (three) times daily.     colchicine 0.6 MG tablet  Take 0.5 tablets (0.3 mg total) by mouth daily.     diclofenac sodium 1 % Gel  Commonly known as:  VOLTAREN  Apply 2 g topically 4 (four) times daily.     furosemide 20 MG tablet  Commonly known as:  LASIX  Take 20 mg by mouth daily.     gabapentin 300 MG capsule  Commonly known as:  NEURONTIN  Take 300 mg by mouth 2 (two) times daily.     glucose blood test strip  Use as instructed     losartan 100 MG tablet  Commonly known as:  COZAAR  Take 1 tablet (100 mg total) by mouth at bedtime.      metFORMIN 500 MG tablet  Commonly known as:  GLUCOPHAGE  Take 2 tablets (1,000 mg total) by mouth 2 (two) times daily with a meal.     MILK OF MAGNESIA 400 MG/5ML suspension  Generic drug:  magnesium hydroxide  Take 30 mLs by mouth daily as needed for mild constipation.     mirtazapine 15 MG tablet  Commonly known as:  REMERON  Take 15 mg by mouth at bedtime as needed (sleep).     ondansetron 4 MG tablet  Commonly known as:  ZOFRAN  Take 1 tablet (4 mg total) by mouth every 8 (eight) hours as needed for nausea or vomiting.     PATADAY 0.2 % Soln  Generic drug:  Olopatadine HCl  Place 1 drop into both eyes daily as needed. For  itchy eyes     ranitidine 150 MG capsule  Commonly known as:  ZANTAC  Take 1 capsule (150 mg total) by mouth 2 (two) times daily as needed for heartburn.     rosuvastatin 20 MG tablet  Commonly known as:  CRESTOR  Take 1 tablet (20 mg total) by mouth at bedtime.        Discharge Instructions: Please refer to Patient Instructions section of EMR for full details.  Patient was counseled important signs and symptoms that should prompt return to medical care, changes in medications, dietary instructions, activity restrictions, and follow up appointments.   Follow-Up Appointments: Follow-up Information    Follow up with [name], MD On 09/29/2014.   Specialty:  Family Medicine   Why:  Appointment for hospital follow-up made for 11/17 at 3pm   Contact information:   [address] [place] [address] [phone]       Follow up with [facility].   Why:  Physical Therapy and Occupational Therapy Services to start within 24-48 hours of discharge   Contact information:   [address] [place] [address] [phone]       Lavon Paganini, MD 09/24/2014, 11:45 AM PGY-1, [facility]

## 2014-09-21 NOTE — Evaluation (Signed)
Physical Therapy Evaluation Patient Details Name: [name] MRN: [id number] DOB: October 20, 1924 Today's Date: 09/21/2014   History of Present Illness  [name] is a 80 y.o. female presenting with dizziness for a week, found to have hypertensive urgency. PMH is significant for HTN, CAD, PAD, DM, asthma, GERD, recent admission for UTI.  Clinical Impression  Pt admitted with above. Pt currently with functional limitations due to the deficits listed below (see PT Problem List). Mod assist needed for bed mobility and transfers.  Pt unable to weight bear through RLE during transfer due to right foot pain.  Pt also unable to ambulate due to the pain. Pt will benefit from skilled PT to increase their independence and safety with mobility to allow discharge to the venue listed below.      Follow Up Recommendations CIR    Equipment Recommendations  None recommended by PT    Recommendations for Other Services       Precautions / Restrictions Precautions Precautions: Fall Restrictions Weight Bearing Restrictions: No Other Position/Activity Restrictions: However she is not wanting to put any weight on her RLE due to what she reports as top of foot pain      Mobility  Bed Mobility Overal bed mobility: Needs Assistance Bed Mobility: Supine to Sit     Supine to sit: Mod assist;HOB elevated     General bed mobility comments: use of bed rail  Transfers Overall transfer level: Needs assistance Equipment used: Rolling walker (2 wheeled) Transfers: Sit to/from [facility] Sit to Stand: Mod assist Stand pivot transfers: Mod assist       General transfer comment: Pt unable to weight bear RLE due to foot pain  Ambulation/Gait                Stairs            Wheelchair Mobility    Modified Rankin (Stroke Patients Only)       Balance Overall balance assessment: Needs assistance Sitting-balance support: Feet supported;No upper extremity  supported Sitting balance-Leahy Scale: Fair     Standing balance support: Bilateral upper extremity supported;During functional activity Standing balance-Leahy Scale: Poor                               Pertinent Vitals/Pain Pain Assessment: Faces Faces Pain Scale: Hurts whole lot Pain Location: top of right foot  Pain Descriptors / Indicators: Grimacing;Guarding Pain Intervention(s): Monitored during session;Limited activity within patient's tolerance    Home Living Family/patient expects to be discharged to:: Private residence Living Arrangements: Children Available Help at Discharge: Family;Available 24 hours/day;Personal care attendant (PCA every morning per pt) Type of Home: House Home Access: Level entry     Home Layout: One level Home Equipment: Walker - 2 wheels;Walker - 4 wheels;Cane - single point;Bedside commode;Shower seat Additional Comments: Taken from last admission 1 month ago. This admission she did report that she normally baths and dresses herself    Prior Function Level of Independence: Independent with assistive device(s)         Comments: Uses cane and RW at home as needed     Hand Dominance   Dominant Hand: Right    Extremity/Trunk Assessment   Upper Extremity Assessment: Generalized weakness           Lower Extremity Assessment: Generalized weakness         Communication   Communication: No difficulties  Cognition Arousal/Alertness: Awake/alert Behavior  During Therapy: WFL for tasks assessed/performed Overall Cognitive Status: No family/caregiver present to determine baseline cognitive functioning                      General Comments      Exercises        Assessment/Plan    PT Assessment Patient needs continued PT services  PT Diagnosis Difficulty walking;Acute pain;Generalized weakness   PT Problem List Decreased strength;Decreased activity tolerance;Decreased balance;Decreased  mobility;Pain;Decreased cognition  PT Treatment Interventions Gait training;Functional mobility training;Therapeutic activities;Therapeutic exercise;Patient/family education;Balance training   PT Goals (Current goals can be found in the Care Plan section) Acute Rehab PT Goals Patient Stated Goal: to figure out why my right foot is hurting so much PT Goal Formulation: With patient Time For Goal Achievement: 10/05/14 Potential to Achieve Goals: Good    Frequency Min 3X/week   Barriers to discharge        Co-evaluation               End of Session Equipment Utilized During Treatment: Gait belt Activity Tolerance: Patient limited by pain Patient left: in chair;with call bell/phone within reach           Time: 1124-1135 PT Time Calculation (min): 11 min   Charges:   PT Evaluation $Initial PT Evaluation Tier I: 1 Procedure     PT G CodesLorriane Shire 09/21/2014, 11:51 AM

## 2014-09-21 NOTE — Evaluation (Addendum)
Occupational Therapy Evaluation Patient Details Name: [name] MRN: [id number] DOB: 29-May-1924 Today's Date: 09/21/2014    History of Present Illness [name] is a 80 y.o. female presenting with dizziness for a week, found to have hypertensive urgency. PMH is significant for HTN, CAD, PAD, DM, asthma, GERD, recent admission for UTI.   Clinical Impression   This 80 yo female admitted with above presents to acute OT with generalized weakness, decreased mobility and balance due to pain in the top of her right foot all affecting what she reports as her PLOF of independent with BADLs. She will benefit from acute OT with follow up OT on CIR to get back to her PLOF.    Follow Up Recommendations  CIR    Equipment Recommendations   (TBD next venue)    Recommendations for Other Services PT consult (made RN aware)     Precautions / Restrictions Precautions Precautions: Fall Restrictions Weight Bearing Restrictions: No Other Position/Activity Restrictions: However she is not wanting to put any weight on her RLE due to what she reports as top of foot pain      Mobility Bed Mobility Overal bed mobility: Needs Assistance Bed Mobility: Supine to Sit     Supine to sit: Mod assist;HOB elevated (and use of rail)        Transfers Overall transfer level: Needs assistance Equipment used: Rolling walker (2 wheeled) Transfers: Sit to/from [facility] Sit to Stand: Min assist (but mod to maintain standing) Stand pivot transfers: Mod assist (going to her left)            Balance Overall balance assessment: Needs assistance Sitting-balance support: Bilateral upper extremity supported;Feet supported Sitting balance-Leahy Scale: Fair     Standing balance support: Bilateral upper extremity supported Standing balance-Leahy Scale: Poor                              ADL Overall ADL's : Needs assistance/impaired Eating/Feeding: Set up;Sitting    Grooming: Set up;Supervision/safety;Sitting   Upper Body Bathing: Set up;Supervision/ safety;Sitting   Lower Body Bathing: Maximal assistance (with min A sit>stand but mod to maintain standing)   Upper Body Dressing : Minimal assistance;Sitting   Lower Body Dressing: Total assistance (with min A sit>stand but mod to maintain standing)   Toilet Transfer: Moderate assistance;Stand-pivot;RW;BSC (going to her left)   Toileting- Clothing Manipulation and Hygiene: Total assistance (with min A sit>stand but mod to maintain standing)                         Pertinent Vitals/Pain Pain Assessment: Faces Faces Pain Scale: Hurts whole lot Pain Location: right top of foot Pain Descriptors / Indicators: Sharp;Aching;Grimacing Pain Intervention(s): Monitored during session;Repositioned     Hand Dominance Right   Extremity/Trunk Assessment Upper Extremity Assessment Upper Extremity Assessment: Generalized weakness   Lower Extremity Assessment Lower Extremity Assessment: Defer to PT evaluation       Communication Communication Communication: No difficulties   Cognition Arousal/Alertness: Awake/alert Behavior During Therapy: WFL for tasks assessed/performed Overall Cognitive Status: Within Functional Limits for tasks assessed                                Home Living Family/patient expects to be discharged to:: Private residence Living Arrangements: Children Available Help at Discharge: Family;Available 24 hours/day Type of Home: House Home Access:  Level entry     Home Layout: One level     Bathroom Shower/Tub: Teacher, early years/pre: Standard     Home Equipment: Environmental consultant - 2 wheels;Walker - 4 wheels;Cane - single point;Bedside commode;Shower seat   Additional Comments: Taken from last admission 1 month ago. This admission she did report that she normally baths and dresses herself      Prior Functioning/Environment Level of Independence:  Independent with assistive device(s)        Comments: Uses cane and RW at home as needed    OT Diagnosis: Generalized weakness;Acute pain   OT Problem List: Decreased strength;Decreased range of motion;Decreased activity tolerance;Impaired balance (sitting and/or standing);Pain;Decreased knowledge of use of DME or AE   OT Treatment/Interventions: Self-care/ADL training;Patient/family education;Balance training;Therapeutic activities;DME and/or AE instruction    OT Goals(Current goals can be found in the care plan section) Acute Rehab OT Goals Patient Stated Goal: to figure out why my right foot is hurting so much OT Goal Formulation: With patient Time For Goal Achievement: 10/05/14 Potential to Achieve Goals: Good ADL Goals Pt Will Perform Lower Body Dressing: with mod assist;sit to/from stand Pt Will Transfer to Toilet: with min assist;ambulating;bedside commode (over toliet) Pt Will Perform Toileting - Clothing Manipulation and hygiene: with min assist;sit to/from stand Additional ADL Goal #1: Pt will be S for in and OOB for BADLs  OT Frequency: Min 2X/week              End of Session Equipment Utilized During Treatment: Gait belt;Rolling walker Nurse Communication:  (NT: +2 going to her left side)  Activity Tolerance: Patient limited by pain Patient left: in chair;with call bell/phone within reach;with chair alarm set   Time: 0600-4599 OT Time Calculation (min): 36 min Charges:  OT General Charges $OT Visit: 1 Procedure OT Evaluation $Initial OT Evaluation Tier I: 1 Procedure OT Treatments $Self Care/Home Management : 23-37 mins  Almon Register [phone] 09/21/2014, 11:29 AM

## 2014-09-21 NOTE — Care Management Note (Addendum)
  Page 2 of 2   09/23/2014     2:28:40 PM CARE MANAGEMENT NOTE 09/23/2014  Patient:  [name], [name]   Account Number:  [id number]  Date Initiated:  09/21/2014  Documentation initiated by:  Mariann Laster  Subjective/Objective Assessment:   hypertension 220/83, AMS     Action/Plan:   CM to follow for disposition needs   Anticipated DC Date:  09/22/2014   Anticipated DC Plan:  [place]  In-house referral  Clinical Social Worker      DC Forensic scientist  CM consult      [facility] Choice  HOME HEALTH   Choice offered to / List presented to:  C-1 Patient        [facility] arranged  [place]   Status of service:  Completed, signed off Medicare Important Message given?  YES (If response is "NO", the following Medicare IM given date fields will be blank) Date Medicare IM given:  09/21/2014 Medicare IM given by:  Wyat Infinger Date Additional Medicare IM given:   Additional Medicare IM given by:    Discharge Disposition:  HOME/SELF CARE  Per UR Regulation:  Reviewed for med. necessity/level of care/duration of stay  If discussed at [place] of Stay Meetings, dates discussed:   09/22/2014    Comments:  Mariann Laster RN, BSN, MSHL, CCM  Nurse - Case Manager,  (Unit [facility])  [phone]  09/22/2014 Social:  From home with family SW notified of CIR update and SW request to f/u on SNF needs Update:  Patient has been d/c.  MD has ordered home with HHS Past services with [facility] Go Dispo:  Home with HHS   PT/OT services ([facility] notified)   Melayah Skorupski RN, BSN, MSHL, CCM  Nurse - Case Manager,  (Unit [phone]  09/22/2014 Per CIR/[facility] patient does not meet all qualifiers for CIR  Cloa Bushong RN, BSN, MSHL, CCM  Nurse - Case Manager,  (Unit [facility])  [phone]  09/21/2014 PT RECS:  CIR (Per CIR Screen appropriate for CIR consult however no beds available over the next few  days. Disposition:  In progress - CM will continue to monitor and asssit as indicated

## 2014-09-21 NOTE — Progress Notes (Signed)
Family Medicine Teaching Service Daily Progress Note Intern Pager: [phone]  [name] Medical record number: [id number] Date of birth: May 09, 1924 Age: 80 y.o. Gender: female  Primary Care Provider: [name], MD Consultants: none Code Status: DNR  Pt Overview and Major Events to Date:   Assessment and Plan: [name] is a 80 y.o. female presenting with dizziness for a week, found to have hypertensive urgency. PMH is significant for HTN, CAD, PAD, DM, asthma, GERD, recent admission for UTI.  # HTN/hypertensive emergency/dizziness: Patient with hypertensive urgency to 220s SBP in ED. Symptoms largely resolved with BP improvement. Head CT negative, EKG unchanged, troponin negative. Suspect now there is a component of delirium - Continue coreg, amlodipine, losartan  - goal <150/90  - reorient as much as possible - consider brain MRI if persistent  - OT for UOB (spoke with daughter this morning; confusion not baseline)  #Fever: Febrile to 100.9 O/N. Complaining of LE pain. Lungs CTAB, no urinary complaints, no rashes. - Continue home gabapentin for neuropathy - CXR, UA, and LE dopplers to look for source of fever  # CHF: No evidence of volume overload or exacerbation. Previously with EF of 25% on echo but repeat in March 2015 showed normal EF and grade 1 diastolic dysfunction - continue meds as above - Monitor volume status  - Continue home Lasix 20mg  daily   # T2DM: A1c 9.0 last month - hold home metformin - sensitive SSI  # CAD/PAD: - continue home ASA and crestor  FEN/GI: heart healthy/carb mod diet, SLIV, home H2 blocker Prophylaxis: SQ heparin  Disposition: pending improvement on BPs/solidify regimen; home safety  Subjective: Somewhat confused this morning, but improving. Reports pain in b/l feet worse in R, feels like neuropathy.  Objective: Temp:  [97.8 F (36.6 C)-100.9 F (38.3 C)] 98.9 F (37.2 C) (11/09 0740) Pulse Rate:  [71-87]  71 (11/09 0740) Resp:  [18-20] 18 (11/09 0740) BP: (104-138)/(38-77) 104/54 mmHg (11/09 0740) SpO2:  [97 %-99 %] 97 % (11/09 0740) Weight:  [118 lb 6.4 oz (53.706 kg)] 118 lb 6.4 oz (53.706 kg) (11/09 0740) Physical Exam: General: frail elderly woman, lying in bed, NAD HEENT: MMM, NCAT, PERRL, EOMI Cardiovascular: RRR, 3/6 systolic murmur, unable to palpate DP pulses bilaterally Respiratory: CTAB, normal WOB Abdomen: soft, NTND, +BS Extremities: WWP, trace LE edema bilaterally, TTP over b/l LEs Skin: no rashes, thin Neuro: alert; oriented to self and place; thought year 1958, president was "black guy"  Laboratory:  Recent Labs Lab 09/19/14 0116  WBC 5.1  HGB 9.7*  HCT 30.8*  PLT 151    Recent Labs Lab 09/19/14 0040 09/20/14 1140  NA 134* 139  K 4.1 4.4  CL 98 102  CO2 23 24  BUN 16 15  CREATININE 1.06 1.18*  CALCIUM 10.4 10.0  PROT  --  6.9  BILITOT  --  0.3  ALKPHOS  --  39  ALT  --  <5  AST  --  16  GLUCOSE 142* 215*    Imaging/Diagnostic Tests: CT head 11/7 IMPRESSION: 1. No acute intracranial findings. 2. Atrophy and white matter microvascular disease are not changed.  Lavon Paganini, MD 09/21/2014, 9:43 AM PGY-1, [place] Intern pager: [phone], text pages welcome

## 2014-09-21 NOTE — Progress Notes (Signed)
Patient able to provide clean cath urine sample using bedpan. Urine appears yellow, cloudy, with malodors. Sample sent to lab. Will continue to monitor.  Ave Filter, RN

## 2014-09-21 NOTE — Progress Notes (Signed)
UR completed [name] [name], RN, BSN, [name], CCM  09/21/2014 3:56 PM

## 2014-09-21 NOTE — Progress Notes (Signed)
Rehab Admissions Coordinator Note:  Patient was screened by Retta Diones for appropriateness for an Inpatient Acute Rehab Consult.  At this time, we are recommending Inpatient Rehab consult.  Note, however, that rehab has very limited bed availability over the next couple days.  Retta Diones 09/21/2014, 2:27 PM  I can be reached at [phone].

## 2014-09-21 NOTE — Consult Note (Signed)
Physical Medicine and Rehabilitation Consult   Reason for Consult: Right foot pain with difficulty walking.  Referring Physician: Dr. Nori Riis   HPI: [name] is a 80 y.o. female with history of HTN, DM type 2 with neuropathy, CAD, PAD, recent RLE superficial venous thrombosis (greater saphenous vein throughout right calf) as well as recent admission for UTI who had BP medications decreased on hospital follow up. She was admitted on 09/19/14 with one week history of dizziness with difficulty walking. Patient with hypertensive emergency with BP 220/83 and BP medications resumed.  CT head without acute changes. Patient with confusion as well as complaints of bilateral foot pain.  PT/OT evaluations done today and patient limited by foot pain with inability to weightbear on RLE. CIR recommended by MD and therapy team.    Review of Systems  HENT: Negative for hearing loss.   Eyes: Negative for blurred vision and double vision.  Respiratory: Positive for shortness of breath. Negative for cough.   Cardiovascular: Negative for chest pain and palpitations.  Gastrointestinal: Negative for heartburn and abdominal pain.  Genitourinary: Negative for dysuria and urgency.  Musculoskeletal: Positive for myalgias and joint pain.  Neurological: Positive for tingling and sensory change. Negative for dizziness and headaches.    Past Medical History  Diagnosis Date  . Breast cancer 10/2010    Invasive Ductal Carcinoma, s/p bilateral mastectomy, now on Tamoxifen. Followed by Dr Jamse Arn.   . Diabetes mellitus   . Hypertension   . Hyperlipidemia   . Anemia     Iron def anemia  . CAD (coronary artery disease)   . CHF (congestive heart failure)     Systolic   . History of colon cancer   . Neuropathy   . Allergy   . GERD (gastroesophageal reflux disease)   . Hiatal hernia   . Diverticulosis   . Asthma   . Arthritis   . H/O: GI bleed   . Breast cancer 2011    s/p Bilateral masectomy  .  Shortness of breath   . Pneumonia 03/2012  . Carotid artery aneurysm 08/2010    right ICA, 5 x 75mm  . DDD (degenerative disc disease), cervical   . Colon cancer   . Colon cancer 12/11/2013  . Myocardial infarction   . Hypercalcemia 06/09/2014    Past Surgical History  Procedure Laterality Date  . Breast masectomy  10/2010    Bilateral masectomy by Dr Lucia Gaskins s/p invasive ductal carcinoma.  . Cardiac  catherization  2007    Severe 3-vessel disease.  EF 20-25%.  . Esophagogastroduodenoscopy  2001    Esophageal Tear  . Total knee arthroplasty  2001  . Esophagogastroduodenoscopy  10/27/2012    Procedure: ESOPHAGOGASTRODUODENOSCOPY (EGD);  Surgeon: Irene Shipper, MD;  [facility] END[facility]COPY;  Service: Endoscopy;  Laterality: N/A;  pat  . Breast surgery      Family History  Problem Relation Age of Onset  . Sickle cell anemia Other   . Heart disease Mother   . Heart disease Father   . Cancer Daughter 85    breast ca  . Cancer Daughter 15    breast ca    Social History:  Lives with family. Independent with cane or walker PTA. She reports that she has never smoked but dips snuff four times a day.  She reports that she does not drink alcohol or use illicit drugs.    Allergies  Allergen Reactions  . Peanuts [Nuts] Swelling  .  Lisinopril Cough    Medications Prior to Admission  Medication Sig Dispense Refill  . acetaminophen (TYLENOL) 500 MG tablet Take 1-2 tablets (500-1,000 mg total) by mouth every 8 (eight) hours as needed. 30 tablet 0  . albuterol (PROVENTIL HFA;VENTOLIN HFA) 108 (90 BASE) MCG/ACT inhaler Inhale 2 puffs into the lungs every 6 (six) hours as needed. For shortness of breath 1 Inhaler 6  . amLODipine (NORVASC) 10 MG tablet Take 10 mg by mouth daily.   3  . aspirin (ASPIRIN CHILDRENS) 81 MG chewable tablet Chew 81 mg by mouth daily.      . carvedilol (COREG) 12.5 MG tablet Take 1 tablet (12.5 mg total) by mouth 2 (two) times daily with a meal. 60 tablet 2  .  diclofenac sodium (VOLTAREN) 1 % GEL Apply 2 g topically 4 (four) times daily. 100 g 3  . furosemide (LASIX) 20 MG tablet Take 20 mg by mouth daily.   3  . gabapentin (NEURONTIN) 300 MG capsule Take 300 mg by mouth 2 (two) times daily.     Marland Kitchen losartan (COZAAR) 50 MG tablet Take 1 tablet (50 mg total) by mouth at bedtime. 30 tablet 2  . magnesium hydroxide (MILK OF MAGNESIA) 400 MG/5ML suspension Take 30 mLs by mouth daily as needed for mild constipation.    . metFORMIN (GLUCOPHAGE) 500 MG tablet Take 2 tablets (1,000 mg total) by mouth 2 (two) times daily with a meal. 120 tablet 1  . mirtazapine (REMERON) 15 MG tablet Take 15 mg by mouth at bedtime as needed (sleep).    . ondansetron (ZOFRAN) 4 MG tablet Take 1 tablet (4 mg total) by mouth every 8 (eight) hours as needed for nausea or vomiting. 20 tablet 0  . PATADAY 0.2 % SOLN Place 1 drop into both eyes daily as needed. For itchy eyes    . ranitidine (ZANTAC) 150 MG capsule Take 1 capsule (150 mg total) by mouth 2 (two) times daily as needed for heartburn. 60 capsule 3  . rosuvastatin (CRESTOR) 20 MG tablet Take 1 tablet (20 mg total) by mouth at bedtime. 30 tablet 2  . tamoxifen (NOLVADEX) 20 MG tablet Take 1 tablet (20 mg total) by mouth daily. 30 tablet 4  . cephALEXin (KEFLEX) 500 MG capsule Take 1 capsule (500 mg total) by mouth 4 (four) times daily. 20 capsule 0  . colchicine 0.6 MG tablet Take 1 tablet (0.6 mg total) by mouth 2 (two) times daily. 60 tablet 5  . glucose blood test strip Use as instructed 100 each 12    Home: Home Living Family/patient expects to be discharged to:: Private residence Living Arrangements: Children Available Help at Discharge: Family, Available 24 hours/day, Personal care attendant (PCA every morning per pt) Type of Home: House Home Access: Level entry Home Layout: One level Home Equipment: Environmental consultant - 2 wheels, Walker - 4 wheels, [place] - single point, Bedside commode, Shower seat Additional Comments: Taken  from last admission 1 month ago. This admission she did report that she normally baths and dresses herself  Functional History: Prior Function Level of Independence: Independent with assistive device(s) Comments: Uses cane and RW at home as needed Functional Status:  Mobility: Bed Mobility Overal bed mobility: Needs Assistance Bed Mobility: Supine to Sit Supine to sit: Mod assist, HOB elevated General bed mobility comments: use of bed rail Transfers Overall transfer level: Needs assistance Equipment used: Rolling walker (2 wheeled) Transfers: Sit to/from Stand, [employer] Transfers Sit to Stand: Mod assist Stand  pivot transfers: Mod assist General transfer comment: Pt unable to weight bear RLE due to foot pain      ADL: ADL Overall ADL's : Needs assistance/impaired Eating/Feeding: Set up, Sitting Grooming: Set up, Supervision/safety, Sitting Upper Body Bathing: Set up, Supervision/ safety, Sitting Lower Body Bathing: Maximal assistance (with min A sit>stand but mod to maintain standing) Upper Body Dressing : Minimal assistance, Sitting Lower Body Dressing: Total assistance (with min A sit>stand but mod to maintain standing) Toilet Transfer: Moderate assistance, Stand-pivot, RW, BSC (going to her left) Toileting- Clothing Manipulation and Hygiene: Total assistance (with min A sit>stand but mod to maintain standing)  Cognition: Cognition Overall Cognitive Status: No family/caregiver present to determine baseline cognitive functioning Orientation Level: Oriented to person, Oriented to place, Other (comment) (pt understand that she is sick and tell month) Cognition Arousal/Alertness: Awake/alert Behavior During Therapy: WFL for tasks assessed/performed Overall Cognitive Status: No family/caregiver present to determine baseline cognitive functioning  Blood pressure 117/46, pulse 80, temperature 98.5 F (36.9 C), temperature source Oral, resp. rate 16, height 4\' 11"  (1.499 m),  weight 53.706 kg (118 lb 6.4 oz), SpO2 97 %. Physical Exam  Nursing note and vitals reviewed. Constitutional: She is oriented to person, place, and time. She appears well-developed and well-nourished.  HENT:  Head: Normocephalic and atraumatic.  Eyes: Conjunctivae are normal. Pupils are equal, round, and reactive to light.  Neck: Normal range of motion. Neck supple.  Cardiovascular: Normal rate and regular rhythm.   Respiratory: Effort normal. No accessory muscle usage. No respiratory distress. She has decreased breath sounds in the right lower field and the left lower field.  GI: Soft. Bowel sounds are normal. She exhibits no distension. There is no tenderness.  Musculoskeletal: She exhibits no edema.  Right foot pain with PF/DF. Allodynia noted. Left foot also tender. More tender across the ankle medially than anywhere else. Mild edema around ankle. No abnormal warmth or discoloration. .   Neurological: She is alert and oriented to person, place, and time.  UE's 5/5. LE's 3+ to 4- prox to 3/5 at ankles (pain). Diminished LT in both feet?  Skin: Skin is warm and dry.  Psychiatric:  anxious    Results for orders placed or performed during the hospital encounter of 09/18/14 (from the past 24 hour(s))  Glucose, capillary     Status: Abnormal   Collection Time: 09/20/14  4:25 PM  Result Value Ref Range   Glucose-Capillary 192 (H) 70 - 99 mg/dL  Glucose, capillary     Status: Abnormal   Collection Time: 09/20/14  9:05 PM  Result Value Ref Range   Glucose-Capillary 187 (H) 70 - 99 mg/dL   Comment 1 Notify RN    Comment 2 Documented in Chart   Glucose, capillary     Status: Abnormal   Collection Time: 09/21/14  6:44 AM  Result Value Ref Range   Glucose-Capillary 143 (H) 70 - 99 mg/dL  Glucose, capillary     Status: Abnormal   Collection Time: 09/21/14 11:13 AM  Result Value Ref Range   Glucose-Capillary 223 (H) 70 - 99 mg/dL   Comment 1 Notify RN    No results  found.  Assessment/Plan: Diagnosis: gait disorder related to bilateral foot pain, right greater than left. ?gout and/or peripheral neuropathy related 1. Does the need for close, 24 hr/day medical supervision in concert with the patient's rehab needs make it unreasonable for this patient to be served in a less intensive setting? Potentially 2. Co-Morbidities requiring supervision/potential complications:  htn, asthma, chronic heart failure 3. Due to bladder management, bowel management, safety, skin/wound care, disease management, medication administration, pain management and patient education, does the patient require 24 hr/day rehab nursing? Potentially 4. Does the patient require coordinated care of a physician, rehab nurse, PT (1-2 hrs/day, 5 days/week) and OT (1-2 hrs/day, 5 days/week) to address physical and functional deficits in the context of the above medical diagnosis(es)? Potentially Addressing deficits in the following areas: balance, endurance, locomotion, strength, transferring, bowel/bladder control, bathing, dressing, feeding, grooming and toileting 5. Can the patient actively participate in an intensive therapy program of at least 3 hrs of therapy per day at least 5 days per week? Potentially 6. The potential for patient to make measurable gains while on inpatient rehab is fair 7. Anticipated functional outcomes upon discharge from inpatient rehab are supervision and min assist  with PT, supervision and min assist with OT, n/a with SLP. 8. Estimated rehab length of stay to reach the above functional goals is: ?7-10 days 9. Does the patient have adequate social supports to accommodate these discharge functional goals? Yes 10. Anticipated D/C setting: Home 11. Anticipated post D/C treatments: [place] therapy 12. Overall Rehab/Functional Prognosis: good  RECOMMENDATIONS: This patient's condition is appropriate for continued rehabilitative care in the following setting: [facility] Patient  has agreed to participate in recommended program. Potentially Note that insurance prior authorization may be required for reimbursement for recommended care.  Comment: Really has no medical necessity for CIR. Would consider gout as potential cause of bilateral foot pain given presentation on exam.   Meredith Staggers, MD, [place] [facility] 09/22/2014     09/21/2014

## 2014-09-21 NOTE — Progress Notes (Signed)
*  PRELIMINARY RESULTS* Vascular Ultrasound Lower extremity venous duplex has been completed.  Preliminary findings: No evidence of DVT.  Landry Mellow, RDMS, RVT  09/21/2014, 12:28 PM

## 2014-09-22 DIAGNOSIS — N3000 Acute cystitis without hematuria: Secondary | ICD-10-CM | POA: Insufficient documentation

## 2014-09-22 DIAGNOSIS — M79604 Pain in right leg: Secondary | ICD-10-CM | POA: Insufficient documentation

## 2014-09-22 LAB — GLUCOSE, CAPILLARY
GLUCOSE-CAPILLARY: 224 mg/dL — AB (ref 70–99)
Glucose-Capillary: 216 mg/dL — ABNORMAL HIGH (ref 70–99)
Glucose-Capillary: 207 mg/dL — ABNORMAL HIGH (ref 70–99)
Glucose-Capillary: 158 mg/dL — ABNORMAL HIGH (ref 70–99)

## 2014-09-22 MED ORDER — INSULIN ASPART 100 UNIT/ML ~~LOC~~ SOLN
0.0000 [IU] | Freq: Every day | SUBCUTANEOUS | Status: DC
  Administered 2014-09-22: 2 [IU] via SUBCUTANEOUS

## 2014-09-22 MED ORDER — COLCHICINE 0.6 MG PO TABS
0.3000 mg | ORAL_TABLET | Freq: Every day | ORAL | Status: DC
  Administered 2014-09-22 – 2014-09-23 (×2): 0.3 mg via ORAL
  Filled 2014-09-22 (×2): qty 0.5

## 2014-09-22 MED ORDER — LOSARTAN POTASSIUM 50 MG PO TABS
100.0000 mg | ORAL_TABLET | Freq: Every day | ORAL | Status: DC
  Administered 2014-09-22: 100 mg via ORAL
  Filled 2014-09-22 (×3): qty 2

## 2014-09-22 MED ORDER — DICLOFENAC SODIUM 1 % TD GEL
2.0000 g | Freq: Four times a day (QID) | TRANSDERMAL | Status: DC
  Administered 2014-09-22 – 2014-09-23 (×4): 2 g via TOPICAL
  Filled 2014-09-22: qty 100

## 2014-09-22 MED ORDER — INSULIN ASPART 100 UNIT/ML ~~LOC~~ SOLN
0.0000 [IU] | Freq: Three times a day (TID) | SUBCUTANEOUS | Status: DC
  Administered 2014-09-22: 3 [IU] via SUBCUTANEOUS
  Administered 2014-09-22: 5 [IU] via SUBCUTANEOUS
  Administered 2014-09-23 (×2): 3 [IU] via SUBCUTANEOUS

## 2014-09-22 MED ORDER — COLCHICINE 0.6 MG PO TABS
0.6000 mg | ORAL_TABLET | Freq: Two times a day (BID) | ORAL | Status: DC
  Filled 2014-09-22 (×2): qty 1

## 2014-09-22 NOTE — Progress Notes (Signed)
Inpatient Rehabilitation  I visited pt. to reiterate Dr. Naaman Plummer' recommendation for home health therapies and family support (pt. Does not meet medical necessity for CIR).  Pt. Was quite sleepy and could not stay awake for me to discuss this with her.  I phoned one of her daughters [name] and left a voice mail .  I provided my contact information in case the family has any questions. I also notified Zhara RN and Crystal Hutchinson, CM of the above.  I will sign off at this time.  Please call if questions.  [place] Admissions Coordinator Cell [phone] Office [phone]

## 2014-09-22 NOTE — Progress Notes (Signed)
Family Medicine Teaching Service Daily Progress Note Intern Pager: [phone]  [name] Medical record number: [id number] Date of birth: Aug 03, 1924 Age: 80 y.o. Gender: female  Primary Care Provider: [name], MD Consultants: none Code Status: DNR  Pt Overview and Major Events to Date:  11/7 - admit to FPTS for hypertensive urgency  Assessment and Plan: [name] is a 80 y.o. female presenting with dizziness for a week, found to have hypertensive urgency. PMH is significant for HTN, CAD, PAD, DM, asthma, GERD, recent admission for UTI.  # HTN/hypertensive emergency/dizziness: Patient with hypertensive urgency to 220s SBP in ED. Symptoms largely resolved with BP improvement. Head CT negative, EKG unchanged, troponin negative. Suspect now there is a component of delirium - Amlodipine held yesterday, now BPs 324-401U systolic. - Restart lower dose Amlodipine today  - Continue home coreg, losartan  - goal <150/90   #Fever: Febrile to 100.9 O/N 11/9. Complaining of LE pain worsening. Patient with delirium that is stable. UA with +nitrites, large leukocytes. CXR clear. LE dopplers without DVT. - Continue Keflex 500mg  QID for empiric UTI tx, will transition to TID dosing at discharge - f/u UCx - reorient as much as possible  # CHF: No evidence of volume overload or exacerbation. Previously with EF of 25% on echo but repeat in March 2015 showed normal EF and grade 1 diastolic dysfunction - continue meds as above - Monitor volume status  - Continue home Lasix 20mg  daily   # T2DM: A1c 9.0 last month. CBGs 216-305 O/N.  Worsening neuropathy - holding home metformin - On sensitive SSI - consider increasing to moderate today - Continue home gabapentin for neuropathy, consider increasing today  # CAD/PAD: - continue home ASA and crestor  FEN/GI: heart healthy/carb mod diet, SLIV, home H2 blocker Prophylaxis: SQ heparin  Disposition: pending improvement on  BPs/solidify regimen; home safety  Subjective: Somewhat confused this morning, but improving. Reports pain in b/l feet worse in R, feels like neuropathy.  Objective: Temp:  [98.5 F (36.9 C)-99.1 F (37.3 C)] 99.1 F (37.3 C) (11/10 0628) Pulse Rate:  [71-86] 71 (11/10 0628) Resp:  [16-18] 16 (11/10 0628) BP: (117-161)/(46-57) 150/57 mmHg (11/10 0628) SpO2:  [97 %-98 %] 97 % (11/10 0628) Weight:  [116 lb 1.6 oz (52.663 kg)] 116 lb 1.6 oz (52.663 kg) (11/10 2725) Physical Exam: General: frail elderly woman, lying in bed, NAD HEENT: MMM, NCAT, PERRL, EOMI Cardiovascular: RRR, 3/6 systolic murmur, unable to palpate DP pulses bilaterally Respiratory: CTAB, normal WOB Abdomen: soft, NTND, +BS Extremities: WWP, trace LE edema bilaterally, Severe TTP over b/l LEs Skin: no rashes, thin Neuro: alert; oriented to self and place; thought year 1968, president is Obama  Laboratory:  Recent Labs Lab 09/19/14 0116  WBC 5.1  HGB 9.7*  HCT 30.8*  PLT 151    Recent Labs Lab 09/19/14 0040 09/20/14 1140  NA 134* 139  K 4.1 4.4  CL 98 102  CO2 23 24  BUN 16 15  CREATININE 1.06 1.18*  CALCIUM 10.4 10.0  PROT  --  6.9  BILITOT  --  0.3  ALKPHOS  --  39  ALT  --  <5  AST  --  16  GLUCOSE 142* 215*    Urinalysis    Component Value Date/Time   COLORURINE YELLOW 09/21/2014 1851   APPEARANCEUR TURBID* 09/21/2014 1851   LABSPEC 1.012 09/21/2014 1851   PHURINE 5.5 09/21/2014 [place] 09/21/2014 1851  HGBUR MODERATE* 09/21/2014 1851   HGBUR negative 10/21/2009 1424   BILIRUBINUR NEGATIVE 09/21/2014 1851   BILIRUBINUR NEG 09/19/2013 1000   KETONESUR NEGATIVE 09/21/2014 1851   PROTEINUR NEGATIVE 09/21/2014 1851   PROTEINUR 30 09/19/2013 1000   UROBILINOGEN 1.0 09/21/2014 1851   UROBILINOGEN 0.2 09/19/2013 1000   NITRITE POSITIVE* 09/21/2014 1851   NITRITE NEG 09/19/2013 1000   LEUKOCYTESUR LARGE* 09/21/2014 1851    Imaging/Diagnostic Tests: CT head 11/7  IMPRESSION: 1. No acute intracranial findings. 2. Atrophy and white matter microvascular disease are not changed.  CXR 11/9: No active cardiopulmonary disease.   Lavon Paganini, MD 09/22/2014, 9:27 AM PGY-1, [place] Intern pager: [phone], text pages welcome

## 2014-09-22 NOTE — Progress Notes (Signed)
Physical Therapy Treatment Patient Details Name: [name] MRN: [id number] DOB: [birth year] Today's Date: 09/22/2014    History of Present Illness [name] is a 80 y.o. female presenting with dizziness for a week, found to have hypertensive urgency. PMH is significant for HTN, CAD, PAD, DM, asthma, GERD, recent admission for UTI.    PT Comments    Pt is elderly female with limitations for all mobiltiy after having limited mobility for the last several days.  Her return to hospital is indicative that she is not succeeding without more intensive rehab.  Her plan is to go to CIR per previous PT note and this PT concurs.  Follow Up Recommendations  CIR     Equipment Recommendations  None recommended by PT    Recommendations for Other Services Rehab consult     Precautions / Restrictions Precautions Precautions: Fall Restrictions Weight Bearing Restrictions: No Other Position/Activity Restrictions: Discussed with nursing concerns about DVT vs Gout as possible sources of pain due to severity    Mobility  Bed Mobility Overal bed mobility: Needs Assistance Bed Mobility: Rolling;Supine to Sit Rolling: Min assist   Supine to sit: Mod assist;HOB elevated     General bed mobility comments: use of bed rail  Transfers Overall transfer level: Needs assistance Equipment used: 1 person hand held assist Transfers: Sit to/from Stand Sit to Stand: Mod assist;From elevated surface         General transfer comment: RLE is too painful to tolerate wbing and cannot tolerate touch to R foot   Ambulation/Gait                 Stairs            Wheelchair Mobility    Modified Rankin (Stroke Patients Only)       Balance Overall balance assessment: Needs assistance Sitting-balance support: Feet supported;Bilateral upper extremity supported Sitting balance-Leahy Scale: Fair Sitting balance - Comments: unsafe to leave unattended as she slowly leans to L side   Postural control: Posterior lean;Left lateral lean Standing balance support: Bilateral upper extremity supported Standing balance-Leahy Scale: Poor Standing balance comment: R leg/foot pain is her limitation                    Cognition Arousal/Alertness: Lethargic Behavior During Therapy: WFL for tasks assessed/performed Overall Cognitive Status: No family/caregiver present to determine baseline cognitive functioning       Memory: Decreased recall of precautions              Exercises      General Comments General comments (skin integrity, edema, etc.): Pt is generally not able to initiate or maintain movement and needs complete cues to get even a limited effort due to foot pain      Pertinent Vitals/Pain Pain Assessment: Faces Faces Pain Scale: Hurts whole lot Pain Location: R foot great toe and  lower leg Pain Descriptors / Indicators: Grimacing Pain Intervention(s): Limited activity within patient's tolerance;Monitored during session;Patient requesting pain meds-RN notified;Repositioned    Home Living                      Prior Function            PT Goals (current goals can now be found in the care plan section) Acute Rehab PT Goals Patient Stated Goal: Complaining of severe R foot pain Progress towards PT goals: Progressing toward goals    Frequency  Min 3X/week    PT  Plan Current plan remains appropriate    Co-evaluation             End of Session   Activity Tolerance: Patient limited by pain Patient left: in bed;with call bell/phone within reach;with bed alarm set     Time: 6924-9324 PT Time Calculation (min) (ACUTE ONLY): 15 min  Charges:  $Therapeutic Activity: 8-22 mins                    G Codes:      Ramond Dial 10/08/2014, 9:05 AM   Mee Hives, PT MS Acute Rehab Dept. Number: [phone]

## 2014-09-23 LAB — GLUCOSE, CAPILLARY
GLUCOSE-CAPILLARY: 178 mg/dL — AB (ref 70–99)
Glucose-Capillary: 151 mg/dL — ABNORMAL HIGH (ref 70–99)

## 2014-09-23 MED ORDER — LOSARTAN POTASSIUM 100 MG PO TABS: 100.0000 mg | ORAL_TABLET | Freq: Every day | ORAL | Status: DC

## 2014-09-23 MED ORDER — CEPHALEXIN 500 MG PO CAPS: 500.0000 mg | ORAL_CAPSULE | Freq: Three times a day (TID) | ORAL | Status: DC

## 2014-09-23 MED ORDER — COLCHICINE 0.6 MG PO TABS: 0.3000 mg | ORAL_TABLET | Freq: Every day | ORAL | Status: DC

## 2014-09-23 NOTE — Discharge Instructions (Signed)
You were admitted for high blood pressure. We have changed your medicines.  Please see the list below to review the changes.  We are also treating you for a urinary tract infection with Keflex.  Please take all antibiotics as prescribed even if you are feeling better.   Hypertension Hypertension, commonly called high blood pressure, is when the force of blood pumping through your arteries is too strong. Your arteries are the blood vessels that carry blood from your heart throughout your body. A blood pressure reading consists of a higher number over a lower number, such as 110/72. The higher number (systolic) is the pressure inside your arteries when your heart pumps. The lower number (diastolic) is the pressure inside your arteries when your heart relaxes. Ideally you want your blood pressure below 120/80. Hypertension forces your heart to work harder to pump blood. Your arteries may become narrow or stiff. Having hypertension puts you at risk for heart disease, stroke, and other problems.  RISK FACTORS Some risk factors for high blood pressure are controllable. Others are not.  Risk factors you cannot control include:   Race. You may be at higher risk if you are African American.  Age. Risk increases with age.  Gender. Men are at higher risk than women before age 74 years. After age 47, women are at higher risk than men. Risk factors you can control include:  Not getting enough exercise or physical activity.  Being overweight.  Getting too much fat, sugar, calories, or salt in your diet.  Drinking too much alcohol. SIGNS AND SYMPTOMS Hypertension does not usually cause signs or symptoms. Extremely high blood pressure (hypertensive crisis) may cause headache, anxiety, shortness of breath, and nosebleed. DIAGNOSIS  To check if you have hypertension, your health care provider will measure your blood pressure while you are seated, with your arm held at the level of your heart. It should be  measured at least twice using the same arm. Certain conditions can cause a difference in blood pressure between your right and left arms. A blood pressure reading that is higher than normal on one occasion does not mean that you need treatment. If one blood pressure reading is high, ask your health care provider about having it checked again. TREATMENT  Treating high blood pressure includes making lifestyle changes and possibly taking medicine. Living a healthy lifestyle can help lower high blood pressure. You may need to change some of your habits. Lifestyle changes may include:  Following the DASH diet. This diet is high in fruits, vegetables, and whole grains. It is low in salt, red meat, and added sugars.  Getting at least 2 hours of brisk physical activity every week.  Losing weight if necessary.  Not smoking.  Limiting alcoholic beverages.  Learning ways to reduce stress. If lifestyle changes are not enough to get your blood pressure under control, your health care provider may prescribe medicine. You may need to take more than one. Work closely with your health care provider to understand the risks and benefits. HOME CARE INSTRUCTIONS  Have your blood pressure rechecked as directed by your health care provider.   Take medicines only as directed by your health care provider. Follow the directions carefully. Blood pressure medicines must be taken as prescribed. The medicine does not work as well when you skip doses. Skipping doses also puts you at risk for problems.   Do not smoke.   Monitor your blood pressure at home as directed by your health care provider. SEEK  MEDICAL CARE IF:   You think you are having a reaction to medicines taken.  You have recurrent headaches or feel dizzy.  You have swelling in your ankles.  You have trouble with your vision. SEEK IMMEDIATE MEDICAL CARE IF:  You develop a severe headache or confusion.  You have unusual weakness, numbness,  or feel faint.  You have severe chest or abdominal pain.  You vomit repeatedly.  You have trouble breathing. MAKE SURE YOU:   Understand these instructions.  Will watch your condition.  Will get help right away if you are not doing well or get worse. Document Released: 10/30/2005 Document Revised: 03/16/2014 Document Reviewed: 08/22/2013 [facility] Patient Information 2015 [place], [place]. This information is not intended to replace advice given to you by your health care provider. Make sure you discuss any questions you have with your health care provider.

## 2014-09-23 NOTE — Progress Notes (Signed)
[name] is one of my clinic patients. She seems to be doing much better today and is much more alert and oriented   Thank you to the [facility] for your excellent care.  [name], MD [facility] Family Medicine Resident  09/23/2014, 2:18 PM

## 2014-09-23 NOTE — Progress Notes (Signed)
Family Medicine Teaching Service Daily Progress Note Intern Pager: [phone]  [name] Medical record number: [id number] Date of birth: [birth year] Age: 80 y.o. Gender: female  Primary Care Provider: [name], MD Consultants: none Code Status: DNR  Pt Overview and Major Events to Date:  11/7 - admit to FPTS for hypertensive urgency  Assessment and Plan:  [name] is a 80 y.o. female presenting with dizziness for a week, found to have hypertensive urgency. PMH is significant for HTN, CAD, PAD, DM, asthma, GERD, recent admission for UTI.  # HTN/hypertensive urgency/dizziness: Patient with hypertensive urgency to 220s SBP in ED. Symptoms largely resolved with BP improvement. Head CT negative, EKG unchanged, troponin negative. Suspect now there is a component of delirium. - Holding home Amlodipine  - Continue home coreg and increased losartan dose of 100mg  daily - goal <150/90 - BPs currently at goal  #UTI: Febrile to 100.9 O/N 11/9. Complaining of LE pain worsening. Patient with delirium that is stable. UA with +nitrites, large leukocytes. CXR clear. LE dopplers without DVT. - Continue Keflex 500mg  QID for empiric UTI tx, will transition to TID dosing at discharge - UCx: >100,000 colonies E Coli - sensitivities pending - reorient as much as possible  #LE pain: Much improved with voltaren gel. R>L. Likely multifactorial from combination of PAD and peripheral neuropathy. Exam not consistent with gout - Continue colchicine 0.3mg  daily (on 0.6mg  BID at home, but on decreased dose for renal function) - Continue home voltaren gel - Continue home gabapentin. Hesitant to increase dose, given renal function  # CHF: No evidence of volume overload or exacerbation. Previously with EF of 25% on echo but repeat in March 2015 showed normal EF and grade 1 diastolic dysfunction - continue meds as above - Monitor volume status  - Continue home Lasix 20mg  daily   # T2DM:  A1c 9.0 last month. CBGs 151-224 O/N.  - holding home metformin - Continue mod SSI  # CAD/PAD: - continue home ASA and crestor  FEN/GI: heart healthy/carb mod diet, SLIV, home H2 blocker Prophylaxis: SQ heparin  Disposition: Likely home later today. Not a CIR candidate  Subjective: Confusion and RLE pain much improved.  Would like to go home today.  Objective: Temp:  [98.6 F (37 C)-98.8 F (37.1 C)] 98.8 F (37.1 C) (11/11 0628) Pulse Rate:  [72-99] 89 (11/11 0628) Resp:  [16-20] 20 (11/11 0628) BP: (140-148)/(59-62) 148/62 mmHg (11/11 0628) SpO2:  [97 %-99 %] 99 % (11/11 0628) Weight:  [115 lb 9.8 oz (52.44 kg)] 115 lb 9.8 oz (52.44 kg) (11/11 8469) Physical Exam: General: frail elderly woman, sitting in bed, NAD HEENT: MMM, NCAT, PERRL, EOMI Cardiovascular: RRR, 3/6 systolic murmur, unable to palpate DP pulses bilaterally Respiratory: CTAB, normal WOB Abdomen: soft, NTND, +BS Extremities: WWP, trace LE edema bilaterally, Mild TTP over RLE, but much improved Skin: no rashes, thin Neuro: alert; oriented to self and place; reports that she nevers knows the year, president is Obama  Laboratory:  Recent Labs Lab 09/19/14 0116  WBC 5.1  HGB 9.7*  HCT 30.8*  PLT 151    Recent Labs Lab 09/19/14 0040 09/20/14 1140  NA 134* 139  K 4.1 4.4  CL 98 102  CO2 23 24  BUN 16 15  CREATININE 1.06 1.18*  CALCIUM 10.4 10.0  PROT  --  6.9  BILITOT  --  0.3  ALKPHOS  --  39  ALT  --  <5  AST  --  16  GLUCOSE 142* 215*    Urinalysis    Component Value Date/Time   COLORURINE YELLOW 09/21/2014 1851   APPEARANCEUR TURBID* 09/21/2014 1851   LABSPEC 1.012 09/21/2014 1851   PHURINE 5.5 09/21/2014 1851   GLUCOSEU NEGATIVE 09/21/2014 1851   HGBUR MODERATE* 09/21/2014 1851   HGBUR negative 10/21/2009 1424   BILIRUBINUR NEGATIVE 09/21/2014 1851   BILIRUBINUR NEG 09/19/2013 1000   KETONESUR NEGATIVE 09/21/2014 1851   PROTEINUR NEGATIVE 09/21/2014 1851   PROTEINUR 30  09/19/2013 1000   UROBILINOGEN 1.0 09/21/2014 1851   UROBILINOGEN 0.2 09/19/2013 1000   NITRITE POSITIVE* 09/21/2014 1851   NITRITE NEG 09/19/2013 1000   LEUKOCYTESUR LARGE* 09/21/2014 1851    Imaging/Diagnostic Tests: CT head 11/7 IMPRESSION: 1. No acute intracranial findings. 2. Atrophy and white matter microvascular disease are not changed.  CXR 11/9: No active cardiopulmonary disease.   Lavon Paganini, MD 09/23/2014, 9:12 AM PGY-1, [place] Intern pager: [phone], text pages welcome

## 2014-09-23 NOTE — Progress Notes (Signed)
1630 discharge instructions given to pt and daughter by TAO,CN both verbalized understanding . Wheeled to lobby by NT

## 2014-09-23 NOTE — Plan of Care (Signed)
Problem: Phase II Progression Outcomes Goal: Progress activity as tolerated unless otherwise ordered Outcome: Adequate for Discharge Goal: Discharge plan established Outcome: Progressing Goal: Vital signs remain stable Outcome: Adequate for Discharge Goal: IV changed to normal saline lock Outcome: Adequate for Discharge Goal: Obtain order to discontinue catheter if appropriate Outcome: Not Applicable Date Met:  06/77/03

## 2014-09-23 NOTE — Progress Notes (Signed)
Inpatient Diabetes Program Recommendations  AACE/ADA: New Consensus Statement on Inpatient Glycemic Control (2013)  Target Ranges:  Prepandial:   less than 140 mg/dL      Peak postprandial:   less than 180 mg/dL (1-2 hours)      Critically ill patients:  140 - 180 mg/dL   Results for [name], [name] (MRN [id number]) as of 09/23/2014 08:59  Ref. Range 09/22/2014 06:25 09/22/2014 12:08 09/22/2014 17:05 09/22/2014 21:44 09/23/2014 05:49  Glucose-Capillary Latest Range: 70-99 mg/dL 216 (H) 207 (H) 158 (H) 224 (H) 151 (H)    Reason for Assessment: elevated CBG  Diabetes history: Type 2 Outpatient Diabetes medications: Glucophage 1000mg  bid Current orders for Inpatient glycemic control: Novolog correction moderate scale 0-15units tid with meals and 0-5units qhs.    Based on current blood sugars and ongoing need for correction Novolog insulin, may want to consider adding Novolog, 2-3 units tid with meals.  Continue correction Novolog.   Gentry Fitz, RN, BA, MHA, CDE Diabetes Coordinator Inpatient Diabetes Program  [phone] (Team Pager) [phone] [facility] Office) 09/23/2014 9:03 AM

## 2014-09-24 ENCOUNTER — Telehealth: Payer: Self-pay | Admitting: Family Medicine

## 2014-09-24 DIAGNOSIS — E1165 Type 2 diabetes mellitus with hyperglycemia: Principal | ICD-10-CM

## 2014-09-24 DIAGNOSIS — IMO0001 Reserved for inherently not codable concepts without codable children: Secondary | ICD-10-CM

## 2014-09-24 LAB — URINE CULTURE

## 2014-09-24 NOTE — Telephone Encounter (Signed)
Daughter called because her mother has been waiting on her test strips for over a month. Epic shows we sent a refill on 08/26/14 but she said that the pharmacy didn't have this. Please call in right away since the patient is out. Blima Rich

## 2014-09-24 NOTE — Telephone Encounter (Signed)
After hours line  Patients daughter calling in  Revised. She states that her mother is slightly confusing night but no more than her baseline. She states that she's been taking and tolerating all of her normal medicines.  She's concerned that she gave her a laxative as well as a suppository and now she has loose stools. She states that her mother now has a mild headache and requested some aspirin for it. Her mother states that she feels fine overall.  She does not have a blood pressure cuff at home, and states that she's been tolerating all of her medicines normally.  I explained that she should monitor her mother and bring her in for evaluation of the emergency department if she becomes confused  Beyond her baseline or if  She develops new or worsening symptoms.  Laroy Apple, MD [place] Resident, PGY-3 09/24/2014, 5:52 PM

## 2014-09-25 ENCOUNTER — Telehealth: Payer: Self-pay | Admitting: Family Medicine

## 2014-09-25 DIAGNOSIS — E1165 Type 2 diabetes mellitus with hyperglycemia: Secondary | ICD-10-CM | POA: Diagnosis not present

## 2014-09-25 DIAGNOSIS — I5022 Chronic systolic (congestive) heart failure: Secondary | ICD-10-CM | POA: Diagnosis not present

## 2014-09-25 DIAGNOSIS — M1990 Unspecified osteoarthritis, unspecified site: Secondary | ICD-10-CM | POA: Diagnosis not present

## 2014-09-25 DIAGNOSIS — G609 Hereditary and idiopathic neuropathy, unspecified: Secondary | ICD-10-CM | POA: Diagnosis not present

## 2014-09-25 DIAGNOSIS — I2510 Atherosclerotic heart disease of native coronary artery without angina pectoris: Secondary | ICD-10-CM | POA: Diagnosis not present

## 2014-09-25 DIAGNOSIS — I10 Essential (primary) hypertension: Secondary | ICD-10-CM | POA: Diagnosis not present

## 2014-09-25 MED ORDER — GLUCOSE BLOOD VI STRP: ORAL_STRIP | Status: DC

## 2014-09-25 NOTE — Telephone Encounter (Signed)
[name] from [place] called and would like orders for PT 1 time a week this week, then 2 times a week for 4 weeks. Please call [name] at [phone].jw

## 2014-09-25 NOTE — Telephone Encounter (Signed)
Rx resent to pharmacy

## 2014-09-28 MED ORDER — GLUCOSE BLOOD VI STRP: ORAL_STRIP | Status: DC

## 2014-09-28 NOTE — Telephone Encounter (Signed)
Verbal orders given for PT twice weekly x 4 weeks.

## 2014-09-28 NOTE — Addendum Note (Signed)
Addended by: [name] on: 09/28/2014 03:01 PM   Modules accepted: Orders

## 2014-09-28 NOTE — Telephone Encounter (Signed)
Rx printed and signed. Placed in box to be faxed to pharmacy.   Thanks, [name], MD [facility] Family Medicine Resident  09/28/2014, 3:00 PM

## 2014-09-28 NOTE — Telephone Encounter (Signed)
RX for test strips cannot be e-prescribed, due to patient having medicare this rx will have to be printed with diagnosis code and MD signature and faxed to pharmacy, will forward to PCP.

## 2014-09-29 ENCOUNTER — Ambulatory Visit (INDEPENDENT_AMBULATORY_CARE_PROVIDER_SITE_OTHER): Payer: Medicare Other | Admitting: Family Medicine

## 2014-09-29 ENCOUNTER — Encounter: Payer: Self-pay | Admitting: Family Medicine

## 2014-09-29 VITALS — BP 173/88 | HR 97 | Temp 98.1°F | Ht 59.0 in | Wt 122.0 lb

## 2014-09-29 DIAGNOSIS — R109 Unspecified abdominal pain: Secondary | ICD-10-CM

## 2014-09-29 DIAGNOSIS — R112 Nausea with vomiting, unspecified: Secondary | ICD-10-CM | POA: Diagnosis not present

## 2014-09-29 DIAGNOSIS — M546 Pain in thoracic spine: Secondary | ICD-10-CM | POA: Diagnosis not present

## 2014-09-29 LAB — POCT URINALYSIS DIPSTICK
BILIRUBIN UA: NEGATIVE
Blood, UA: NEGATIVE
GLUCOSE UA: NEGATIVE
KETONES UA: NEGATIVE
Leukocytes, UA: NEGATIVE
Nitrite, UA: NEGATIVE
Protein, UA: 30
Spec Grav, UA: 1.015
Urobilinogen, UA: 0.2
pH, UA: 5.5

## 2014-09-29 LAB — POCT UA - MICROSCOPIC ONLY

## 2014-09-29 MED ORDER — TRAMADOL HCL 50 MG PO TABS: 50.0000 mg | ORAL_TABLET | Freq: Two times a day (BID) | ORAL | Status: DC | PRN

## 2014-09-29 NOTE — Patient Instructions (Signed)
I am sorry you are feeling so badly today [name] If you start to notice a rash on your right side, please make a same day appointment I have prescribed tramadol for the pain. If the pain does not improve by Friday or if it worsens, please call the clinic and I will order imaging of your spine. Please follow up with me in 2 weeks.

## 2014-09-29 NOTE — Assessment & Plan Note (Signed)
Given the patient's right-sided flank pain, there was concern for pyelonephritis, continue urinary tract infection, or nephrolithiasis. A UA was obtained that was negative for nitrites and leukocyte esterase, as well as blood.  This very may well be a shingles outbreak, prior to the onset of rash. The patient has had chickenpox, without Zostavax. -Advised patient to continue to monitor for rashes and contact the clinic if she notes one She began to have urinary symptoms or fever -Tramadol when necessary pain -Continue to monitor by mouth intake -Patient advised to return to clinic if symptoms worsen or do not improve.

## 2014-09-29 NOTE — Assessment & Plan Note (Addendum)
Patient has chronic back pain, and states she has not had this pain prior, however from reviewing the EMR she has frequently visited with concerns of thoracic pain. An x-ray in May 2015 showed thoracic scoliosis with multilevel degenerative disc disease in the mid thoracic spine, which is approximately the area where she endorses her symptoms. There are no red flags noted on exam. Given the patient's age and degenerative joint disease, there is the possibility of a compression fracture. Additionally, lytic lesions cannot be ruled out given the patient's history of both breast cancer and colon cancer, the patient's alkaline phosphatase was normal during the last admission. As Tylenol, heat, and Voltaren gel were not beneficial, will attempt tramadol, patient aware of the side effects.  -Prescribed tramadol as above with warnings about balance and drowsiness -Advised patient to continue with Lidoderm patches and heating -Advised patient to return to clinic if symptoms do not improve by Friday, as we could consider getting a repeat thoracic x-ray.

## 2014-09-29 NOTE — Progress Notes (Signed)
[facility]  [name] Patten, MD  Subjective:  Chief complaint: hospital follow-up  [name] is presenting for a hospital follow-up due to confusion, hypertensive urgency, and urinary tract infection. The patient states that she has been doing well, she denies dysuria, polyuria, urinary urgency, or urinary frequency. She denies a fever or chills. She does note that yesterday she began to have right sided flank pain. Onset was gradual and it has been worsening. She denies any rash.  She also endorses new onset thoracic back pain that started yesterday as well. She tells me the pain is centrally located and does not radiate. Nothing makes this pain better or worse. She has attempted to use heating pads and Voltaren gel on both locations, without resolution or improvement. She denies any radicular pain or saddle paresthesias. She does have urinary incontinence, but this is stable. Additionally, she does have nausea and occasional vomiting, but she has had this for several months now.  She worries that she may have a pneumonia, as she felt she had right-sided pain similar to this the last time she was diagnosed with pneumonia. No shortness of breath or chest pain. Has an occasional cough that is at her baseline. She denies any trauma to her side or her back.  Of note, her daughter feels that her mental state is at its baseline.  Past Medical History Patient Active Problem List   Diagnosis Date Noted  . Flank pain 09/29/2014  . Acute cystitis without hematuria   . Right leg pain   . Accelerated hypertension   . Pyrexia   . Encephalopathy   . Hypertensive emergency 09/19/2014  . Dizziness 09/19/2014  . Anemia 08/19/2014  . Acute superficial venous thrombosis of right lower extremity 08/19/2014  . Nausea without vomiting 08/14/2014  . Vomiting 08/14/2014  . Intractable vomiting with nausea 08/14/2014  . Right ankle pain 08/10/2014  . Hypercalcemia 06/09/2014  . Breast  cancer, left breast 06/09/2014  . Breast cancer, right breast 06/09/2014  . Edema of left lower extremity 03/30/2014  . PAD (peripheral artery disease) 03/30/2014  . Colon cancer 12/11/2013  . Neck pain 10/22/2013  . Chest pain, unspecified 09/19/2013  . Bilateral neck pain 09/19/2013  . Light headed 06/16/2013  . History of fall 05/15/2013  . Right thigh pain 05/15/2013  . Memory deficit 05/15/2013  . Bilateral leg pain 03/07/2013  . Trigger thumb of right hand 03/07/2013  . Lower extremity edema 02/19/2013  . Weakness generalized 01/22/2013  . Stricture and stenosis of esophagus 10/27/2012  . Food impaction of esophagus 10/27/2012  . Gout attack 09/13/2012  . Poor social situation 09/13/2012  . Low back pain 08/07/2012  . Musculoskeletal chest pain 07/10/2012  . Thoracic back pain 05/24/2012  . Weight loss 04/03/2012  . Insomnia 09/25/2011  . Urinary frequency 06/02/2011  . Tobacco abuse 06/02/2011  . History of breast cancer in female, bilateral.  Mastectomies 10/24/2010. 04/26/2011  . Hiatal hernia 03/22/2011  . Colon polyp 03/22/2011  . Poor circulation 03/22/2011  . Other malaise and fatigue 03/22/2011  . 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 NEUROPATHY 08/25/2009  . ANEMIA-IRON DEFICIENCY 04/29/2007  . HYPERLIPIDEMIA 01/29/2007  . Diabetes mellitus type 2, uncontrolled, without complications 46/27/0350  . HYPERTENSION, BENIGN SYSTEMIC 01/10/2007  . CORONARY, ARTERIOSCLEROSIS 01/10/2007  . Chronic systolic heart failure 09/38/1829  . Asthma 01/10/2007  . Reflux esophagitis 01/10/2007  . ARTHRITIS 01/10/2007    Medications- reviewed and updated Current Outpatient Prescriptions  Medication Sig Dispense  Refill  . acetaminophen (TYLENOL) 500 MG tablet Take 1-2 tablets (500-1,000 mg total) by mouth every 8 (eight) hours as needed. 30 tablet 0  . albuterol (PROVENTIL HFA;VENTOLIN HFA) 108 (90 BASE) MCG/ACT inhaler Inhale 2 puffs into the lungs every 6 (six)  hours as needed. For shortness of breath 1 Inhaler 6  . aspirin (ASPIRIN CHILDRENS) 81 MG chewable tablet Chew 81 mg by mouth daily.      . carvedilol (COREG) 12.5 MG tablet Take 1 tablet (12.5 mg total) by mouth 2 (two) times daily with a meal. 60 tablet 2  . cephALEXin (KEFLEX) 500 MG capsule Take 1 capsule (500 mg total) by mouth 3 (three) times daily. 18 capsule 0  . colchicine 0.6 MG tablet Take 0.5 tablets (0.3 mg total) by mouth daily. 30 tablet 0  . diclofenac sodium (VOLTAREN) 1 % GEL Apply 2 g topically 4 (four) times daily. 100 g 3  . furosemide (LASIX) 20 MG tablet Take 20 mg by mouth daily.   3  . gabapentin (NEURONTIN) 300 MG capsule Take 300 mg by mouth 2 (two) times daily.     Marland Kitchen glucose blood test strip Use as instructed Dx. Code 250.02 100 each 12  . losartan (COZAAR) 100 MG tablet Take 1 tablet (100 mg total) by mouth at bedtime. 30 tablet 0  . magnesium hydroxide (MILK OF MAGNESIA) 400 MG/5ML suspension Take 30 mLs by mouth daily as needed for mild constipation.    . metFORMIN (GLUCOPHAGE) 500 MG tablet Take 2 tablets (1,000 mg total) by mouth 2 (two) times daily with a meal. 120 tablet 1  . mirtazapine (REMERON) 15 MG tablet Take 15 mg by mouth at bedtime as needed (sleep).    . ondansetron (ZOFRAN) 4 MG tablet Take 1 tablet (4 mg total) by mouth every 8 (eight) hours as needed for nausea or vomiting. 20 tablet 0  . PATADAY 0.2 % SOLN Place 1 drop into both eyes daily as needed. For itchy eyes    . ranitidine (ZANTAC) 150 MG capsule Take 1 capsule (150 mg total) by mouth 2 (two) times daily as needed for heartburn. 60 capsule 3  . rosuvastatin (CRESTOR) 20 MG tablet Take 1 tablet (20 mg total) by mouth at bedtime. 30 tablet 2  . traMADol (ULTRAM) 50 MG tablet Take 1 tablet (50 mg total) by mouth every 12 (twelve) hours as needed for severe pain. 30 tablet 0  . [DISCONTINUED] cetirizine (ZYRTEC) 5 MG tablet Take 1 tablet (5 mg total) by mouth daily. Take at night time as it can  cause some drowsiness 30 tablet 4   No current facility-administered medications for this visit.    Objective: BP 173/88 mmHg  Pulse 97  Temp(Src) 98.1 F (36.7 C) (Oral)  Ht 4\' 11"  (1.499 m)  Wt 122 lb (55.339 kg)  BMI 24.63 kg/m2 Gen: appears to be in mild distress, leaning over in the chair. Alert, cooperative with exam HEENT: Atraumatic, Oropharynx clear. MMM CV: RRR. No murmurs, rubs, or gallops noted. 1+ radial pulses bilaterally. Resp: CTAB. No wheezing, crackles, or rhonchi noted. Abd: +BS. Soft, non-distended. Tender in the right flank with minimal palpation, but not to light touch. No rebound or guarding. Numerous Lidoderm patches on her abdomen.  Back: Lidoderm patch located in the medial aspect of her thoracic spine. Tenderness to palpation over the thoracic spine and over the paraspinal muscles to the left and the right. Some muscular tension as well. Cannot elicit radicular pain with movement  or with palpation. No ecchymoses or other signs of trauma noted.   Assessment/Plan:  Flank pain Given the patient's right-sided flank pain, there was concern for pyelonephritis, continue urinary tract infection, or nephrolithiasis. A UA was obtained that was negative for nitrites and leukocyte esterase, as well as blood.  This very may well be a shingles outbreak, prior to the onset of rash. The patient has had chickenpox, without Zostavax. -Advised patient to continue to monitor for rashes and contact the clinic if she notes one She began to have urinary symptoms or fever -Tramadol when necessary pain -Continue to monitor by mouth intake -Patient advised to return to clinic if symptoms worsen or do not improve.   Thoracic back pain Patient has chronic back pain, and states she has not had this pain prior, however from reviewing the EMR she has frequently visited with concerns of thoracic pain. An x-ray in May 2015 showed thoracic scoliosis with multilevel degenerative disc disease  in the mid thoracic spine, which is approximately the area where she endorses her symptoms. There are no red flags noted on exam. Given the patient's age and degenerative joint disease, there is the possibility of a compression fracture. Additionally, lytic lesions cannot be ruled out given the patient's history of both breast cancer and colon cancer, the patient's alkaline phosphatase was normal during the last admission. As Tylenol, heat, and Voltaren gel were not beneficial, will attempt tramadol, patient aware of the side effects.  -Prescribed tramadol as above with warnings about balance and drowsiness -Advised patient to continue with Lidoderm patches and heating -Advised patient to return to clinic if symptoms do not improve by Friday, as we could consider getting a repeat thoracic x-ray.    Orders Placed This Encounter  Procedures  . POCT UA - Microscopic Only  . POCT urinalysis dipstick    Meds ordered this encounter  Medications  . traMADol (ULTRAM) 50 MG tablet    Sig: Take 1 tablet (50 mg total) by mouth every 12 (twelve) hours as needed for severe pain.    Dispense:  30 tablet    Refill:  0

## 2014-09-30 ENCOUNTER — Telehealth: Payer: Self-pay | Admitting: Family Medicine

## 2014-09-30 NOTE — Progress Notes (Signed)
Patient ID: [name], female   DOB: 11-23-1923, 80 y.o.   MRN: [id number] Precepted and agree with management.

## 2014-09-30 NOTE — Telephone Encounter (Signed)
Pt needs to speak with RN re: her test strips she gets from [facility], pt is having trouble getting them.

## 2014-09-30 NOTE — Telephone Encounter (Signed)
Called pt to get number to call for her test strips.  Called [facility]; the representative stated that the PCP is not medicare certified so that would delay the process of her order.  Dr. Nori Riis has agreed to sign off for the order of diabetic supplies.  Pt informed that once the form is signed and faxed back to [facility]; it would take 7-10 business days to process order.  Pt stated understanding.  Derl Barrow, RN

## 2014-10-07 ENCOUNTER — Encounter: Payer: Self-pay | Admitting: Surgery

## 2014-10-07 ENCOUNTER — Ambulatory Visit: Payer: Medicare Other | Admitting: Family Medicine

## 2014-10-12 ENCOUNTER — Ambulatory Visit (HOSPITAL_COMMUNITY)
Admission: RE | Admit: 2014-10-12 | Discharge: 2014-10-12 | Disposition: A | Discharge status: Home / Self Care | Payer: Medicare Other | Source: Ambulatory Visit | Attending: Family | Admitting: Family

## 2014-10-12 ENCOUNTER — Encounter: Payer: Self-pay | Admitting: Family

## 2014-10-12 ENCOUNTER — Ambulatory Visit (INDEPENDENT_AMBULATORY_CARE_PROVIDER_SITE_OTHER): Payer: Medicare Other | Admitting: Family

## 2014-10-12 VITALS — BP 144/80 | HR 79 | Resp 16 | Ht 59.0 in | Wt 122.0 lb

## 2014-10-12 DIAGNOSIS — I739 Peripheral vascular disease, unspecified: Secondary | ICD-10-CM

## 2014-10-12 DIAGNOSIS — R600 Localized edema: Secondary | ICD-10-CM | POA: Diagnosis not present

## 2014-10-12 NOTE — Progress Notes (Signed)
[facility] [place] HISTORY AND PHYSICAL -PAD  History of Present Illness [name] is a 80 y.o. female patient of Dr. Trula Slade who returns today for follow up of her PAD. She was given a trial of Neurontin which she said made things dramatically better. She does walk with a cane. She denies any cramps with walking.  The patient denies nonhealing wounds.  The pt denies any history of stroke or TIA.  The patient is a diabetic. Her blood sugars are moderately well controlled.The patient does take a statin for hypercholesterolemia. She is medically managed for hypertension.  She was hospitalized at [facility] in October, 2015 for uncontrolled hypertension. Last A1C was 9.0 in October, 2015, states her blood sugar has improved since then.  Pt has not had previous PAD intervention.  Pt Diabetic: Yes Pt smoker: non-smoker  Pt meds include: Statin :Yes Betablocker: Yes ASA: Yes Other anticoagulants/antiplatelets: no  Past Medical History  Diagnosis Date  . Breast cancer 10/2010    Invasive Ductal Carcinoma, s/p bilateral mastectomy, now on Tamoxifen. Followed by Dr Jamse Arn.   . Diabetes mellitus   . Hypertension   . Hyperlipidemia   . Anemia     Iron def anemia  . CAD (coronary artery disease)   . CHF (congestive heart failure)     Systolic   . History of colon cancer   . Neuropathy   . Allergy   . GERD (gastroesophageal reflux disease)   . Hiatal hernia   . Diverticulosis   . Asthma   . Arthritis   . H/O: GI bleed   . Breast cancer 2011    s/p Bilateral masectomy  . Shortness of breath   . Pneumonia 03/2012  . Carotid artery aneurysm 08/2010    right ICA, 5 x 20mm  . DDD (degenerative disc disease), cervical   . Colon cancer   . Colon cancer 12/11/2013  . Myocardial infarction   . Hypercalcemia 06/09/2014    Social History History  Substance Use Topics  . Smoking status: Never Smoker   . Smokeless tobacco: Current User    Types: Snuff  .  Alcohol Use: No    Family History Family History  Problem Relation Age of Onset  . Sickle cell anemia Other   . Heart disease Mother   . Heart disease Father   . Cancer Daughter 26    breast ca  . Hypertension Daughter   . Cancer Daughter 41    breast ca  . Hypertension Daughter   . Heart disease Son     Poor circulation-Left Leg  . Diabetes Daughter   . Hypertension Daughter   . Hyperlipidemia Daughter     Poor circulation- Toe amputation    Past Surgical History  Procedure Laterality Date  . Breast masectomy  10/2010    Bilateral masectomy by Dr Lucia Gaskins s/p invasive ductal carcinoma.  . Cardiac  catherization  2007    Severe 3-vessel disease.  EF 20-25%.  . Esophagogastroduodenoscopy  2001    Esophageal Tear  . Total knee arthroplasty  2001  . Esophagogastroduodenoscopy  10/27/2012    Procedure: ESOPHAGOGASTRODUODENOSCOPY (EGD);  Surgeon: Irene Shipper, MD;  [facility] ENDOSCOPY;  Service: Endoscopy;  Laterality: N/A;  pat  . Breast surgery    . Joint replacement Right 2001    Knee    Allergies  Allergen Reactions  . Peanuts [Nuts] Swelling  . Lisinopril Cough    Current Outpatient Prescriptions  Medication Sig Dispense Refill  .  acetaminophen (TYLENOL) 500 MG tablet Take 1-2 tablets (500-1,000 mg total) by mouth every 8 (eight) hours as needed. 30 tablet 0  . albuterol (PROVENTIL HFA;VENTOLIN HFA) 108 (90 BASE) MCG/ACT inhaler Inhale 2 puffs into the lungs every 6 (six) hours as needed. For shortness of breath 1 Inhaler 6  . amLODipine (NORVASC) 10 MG tablet 2 (two) times daily.  3  . aspirin (ASPIRIN CHILDRENS) 81 MG chewable tablet Chew 81 mg by mouth daily.      . carvedilol (COREG) 12.5 MG tablet Take 1 tablet (12.5 mg total) by mouth 2 (two) times daily with a meal. 60 tablet 2  . colchicine 0.6 MG tablet Take 0.5 tablets (0.3 mg total) by mouth daily. 30 tablet 0  . diclofenac sodium (VOLTAREN) 1 % GEL Apply 2 g topically 4 (four) times daily. 100 g 3  .  furosemide (LASIX) 20 MG tablet Take 20 mg by mouth daily.   3  . gabapentin (NEURONTIN) 300 MG capsule Take 300 mg by mouth 2 (two) times daily.     Marland Kitchen glucose blood test strip Use as instructed Dx. Code 250.02 100 each 12  . losartan (COZAAR) 100 MG tablet Take 1 tablet (100 mg total) by mouth at bedtime. 30 tablet 0  . magnesium hydroxide (MILK OF MAGNESIA) 400 MG/5ML suspension Take 30 mLs by mouth daily as needed for mild constipation.    . metFORMIN (GLUCOPHAGE) 500 MG tablet Take 2 tablets (1,000 mg total) by mouth 2 (two) times daily with a meal. 120 tablet 1  . mirtazapine (REMERON) 15 MG tablet Take 15 mg by mouth at bedtime as needed (sleep).    . ondansetron (ZOFRAN) 4 MG tablet Take 1 tablet (4 mg total) by mouth every 8 (eight) hours as needed for nausea or vomiting. 20 tablet 0  . PATADAY 0.2 % SOLN Place 1 drop into both eyes daily as needed. For itchy eyes    . ranitidine (ZANTAC) 150 MG capsule Take 1 capsule (150 mg total) by mouth 2 (two) times daily as needed for heartburn. 60 capsule 3  . rosuvastatin (CRESTOR) 20 MG tablet Take 1 tablet (20 mg total) by mouth at bedtime. 30 tablet 2  . tamoxifen (NOLVADEX) 20 MG tablet daily.    . traMADol (ULTRAM) 50 MG tablet Take 1 tablet (50 mg total) by mouth every 12 (twelve) hours as needed for severe pain. 30 tablet 0  . cephALEXin (KEFLEX) 500 MG capsule Take 1 capsule (500 mg total) by mouth 3 (three) times daily. (Patient not taking: Reported on 10/12/2014) 18 capsule 0  . [DISCONTINUED] cetirizine (ZYRTEC) 5 MG tablet Take 1 tablet (5 mg total) by mouth daily. Take at night time as it can cause some drowsiness 30 tablet 4   No current facility-administered medications for this visit.    ROS: See HPI for pertinent positives and negatives.   Physical Examination  Filed Vitals:   10/12/14 1221  BP: 144/80  Pulse: 79  Resp: 16  Height: 4\' 11"  (1.499 m)  Weight: 122 lb (55.339 kg)  SpO2: 100%   Body mass index is 24.63  kg/(m^2).  General: A&O x 3, WDWN. Gait: in wheelchair Eyes: PERRLA. Pulmonary: CTAB, without wheezes , rales or rhonchi. Cardiac: regular Rythm , without detected murmur.         Carotid Bruits Right Left   Negative Negative  Aorta is not palpable. Radial pulses: right is 1+, left is 2+ palpable  VASCULAR EXAM: Extremities without ischemic changes  without Gangrene; without open wounds.                                                                                                          LE Pulses Right Left       FEMORAL  not palpable  not palpable        POPLITEAL  1+ palpable   1+ palpable       POSTERIOR TIBIAL  not palpable   not palpable        DORSALIS PEDIS      ANTERIOR TIBIAL 2+ palpable  1+ palpable    Abdomen: soft, NT, no palpated masses. Skin: no rashes, no ulcers noted. Musculoskeletal: no muscle wasting or atrophy.  Neurologic: A&O X 3; Appropriate Affect ; SENSATION: normal; MOTOR FUNCTION:  moving all extremities equally, motor strength 5/5 throughout. Speech is fluent/normal. CN 2-12 intact.    Non-Invasive Vascular Imaging: DATE: 10/12/2014 ABI: RIGHT 0.91, Waveforms: monophasic; TBI: 0.47;  LEFT 1.15, Waveforms: biphasic; TBI: 0.48 Previous (03/30/14) ABI's: Right: 0.51, LEFT: 0.62 The right Doppler waveforms do not correlate with the ABI, suggestive of medial wall calcification.   ASSESSMENT: [name] is a 80 y.o. female who presents with asymptomatic mild/moderate PAD.   PLAN:  Graduated walking program and daily seated leg exercises as discussed and demonstrated.  Continued maximal medical management of atherosclerotic risk factors.  I discussed in depth with the patient the nature of atherosclerosis, and emphasized the importance of maximal medical management including strict control of blood pressure, blood glucose, and lipid levels, obtaining regular exercise, and continued cessation of smoking.  The patient  is aware that without maximal medical management the underlying atherosclerotic disease process will progress, limiting the benefit of any interventions.  Based on the patient's vascular studies and examination, pt will return to clinic in 1 year with ABI's.  The patient was given information about PAD including signs, symptoms, treatment, what symptoms should prompt the patient to seek immediate medical care, and risk reduction measures to take.  Clemon Chambers, RN, MSN, FNP-C [facility] Phone: [phone]  Clinic MD: Trula Slade  10/12/2014 12:33 PM

## 2014-10-12 NOTE — Patient Instructions (Addendum)

## 2014-10-19 ENCOUNTER — Other Ambulatory Visit: Payer: Self-pay | Admitting: Family Medicine

## 2014-10-26 ENCOUNTER — Ambulatory Visit (INDEPENDENT_AMBULATORY_CARE_PROVIDER_SITE_OTHER): Payer: Medicare Other | Admitting: Family Medicine

## 2014-10-26 ENCOUNTER — Encounter: Payer: Self-pay | Admitting: Family Medicine

## 2014-10-26 VITALS — BP 117/62 | HR 92 | Temp 98.4°F | Ht 59.0 in | Wt 118.0 lb

## 2014-10-26 DIAGNOSIS — E1165 Type 2 diabetes mellitus with hyperglycemia: Secondary | ICD-10-CM

## 2014-10-26 DIAGNOSIS — M546 Pain in thoracic spine: Secondary | ICD-10-CM

## 2014-10-26 DIAGNOSIS — I10 Essential (primary) hypertension: Secondary | ICD-10-CM

## 2014-10-26 DIAGNOSIS — E119 Type 2 diabetes mellitus without complications: Secondary | ICD-10-CM | POA: Diagnosis not present

## 2014-10-26 DIAGNOSIS — R112 Nausea with vomiting, unspecified: Secondary | ICD-10-CM | POA: Diagnosis not present

## 2014-10-26 DIAGNOSIS — IMO0001 Reserved for inherently not codable concepts without codable children: Secondary | ICD-10-CM

## 2014-10-26 MED ORDER — TRAMADOL HCL 50 MG PO TABS: 50.0000 mg | ORAL_TABLET | Freq: Two times a day (BID) | ORAL | Status: DC | PRN

## 2014-10-26 NOTE — Patient Instructions (Signed)
You blood pressure looks great today and I'm glad you're feeling better Please continue to take all your medications  Please come back and see me in 3 months

## 2014-10-27 LAB — BASIC METABOLIC PANEL
BUN: 17 mg/dL (ref 6–23)
CHLORIDE: 107 meq/L (ref 96–112)
CO2: 24 meq/L (ref 19–32)
Calcium: 10.2 mg/dL (ref 8.4–10.5)
Creat: 1.01 mg/dL (ref 0.50–1.10)
Glucose, Bld: 118 mg/dL — ABNORMAL HIGH (ref 70–99)
POTASSIUM: 4 meq/L (ref 3.5–5.3)
SODIUM: 141 meq/L (ref 135–145)

## 2014-10-28 ENCOUNTER — Encounter: Payer: Self-pay | Admitting: Family Medicine

## 2014-10-28 NOTE — Progress Notes (Signed)
Patient ID: [name], female   DOB: 1923-11-27, 80 y.o.   MRN: [id number]  [facility]  [name], MD  Subjective:  Chief complaint: Checkup  [name] is a 80 year old presenting for a follow-up on hypertension and thoracic back pain  Hypertension: The patient is currently taking losartan, Coreg, and Lasix. Still holding amlodipine. She denies any missed doses. No chest pain, shortness of breath, dizziness, lightheadedness. She feels she has been doing very well and has no complaints.  Thoracic back pain: The patient states that she has been taking tramadol and Tylenol for the pain. Initially she states that she is happy with no pain at all today. She denies any issues with urinary incontinence or bowel incontinence. No saddle paresthesias. No tingling or numbness in the upper or lower extremities. She does request a refill on her tramadol, as she states she still continues to have to use it occasionally after a long day.  Diabetes mellitus: The patient is currently well maintained on metformin 1000 mg daily. Her metformin was recently increased during her last hospitalization when her A1c was found to be elevated to 9.0. She denies any GI side effects or hypoglycemic spells. She denies any numbness or tingling in her hands or feet. No polyuria or polydipsia.  She wears glasses and states it has been quite sometimes since she had an ophthalmology appointment.   Past Medical History Patient Active Problem List   Diagnosis Date Noted  . Flank pain 09/29/2014  . Acute cystitis without hematuria   . Right leg pain   . Accelerated hypertension   . Pyrexia   . Encephalopathy   . Hypertensive emergency 09/19/2014  . Dizziness 09/19/2014  . Anemia 08/19/2014  . Acute superficial venous thrombosis of right lower extremity 08/19/2014  . Nausea without vomiting 08/14/2014  . Vomiting 08/14/2014  . Intractable vomiting with nausea 08/14/2014  . Right ankle pain  08/10/2014  . Hypercalcemia 06/09/2014  . Breast cancer, left breast 06/09/2014  . Breast cancer, right breast 06/09/2014  . Edema of left lower extremity 03/30/2014  . PAD (peripheral artery disease) 03/30/2014  . Colon cancer 12/11/2013  . Neck pain 10/22/2013  . Chest pain, unspecified 09/19/2013  . Bilateral neck pain 09/19/2013  . Light headed 06/16/2013  . History of fall 05/15/2013  . Right thigh pain 05/15/2013  . Memory deficit 05/15/2013  . Bilateral leg pain 03/07/2013  . Trigger thumb of right hand 03/07/2013  . Lower extremity edema 02/19/2013  . Weakness generalized 01/22/2013  . Stricture and stenosis of esophagus 10/27/2012  . Food impaction of esophagus 10/27/2012  . Gout attack 09/13/2012  . Poor social situation 09/13/2012  . Low back pain 08/07/2012  . Musculoskeletal chest pain 07/10/2012  . Thoracic back pain 05/24/2012  . Weight loss 04/03/2012  . Insomnia 09/25/2011  . Urinary frequency 06/02/2011  . Tobacco abuse 06/02/2011  . History of breast cancer in female, bilateral.  Mastectomies 10/24/2010. 04/26/2011  . Hiatal hernia 03/22/2011  . Colon polyp 03/22/2011  . Poor circulation 03/22/2011  . Other malaise and fatigue 03/22/2011  . 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 NEUROPATHY 08/25/2009  . ANEMIA-IRON DEFICIENCY 04/29/2007  . HYPERLIPIDEMIA 01/29/2007  . Diabetes mellitus type 2, uncontrolled, without complications 93/71/6967  . HYPERTENSION, BENIGN SYSTEMIC 01/10/2007  . CORONARY, ARTERIOSCLEROSIS 01/10/2007  . Chronic systolic heart failure 89/38/1017  . Asthma 01/10/2007  . Reflux esophagitis 01/10/2007  . ARTHRITIS 01/10/2007    Medications- reviewed and updated Current  Outpatient Prescriptions  Medication Sig Dispense Refill  . acetaminophen (TYLENOL) 500 MG tablet Take 1-2 tablets (500-1,000 mg total) by mouth every 8 (eight) hours as needed. 30 tablet 0  . albuterol (PROVENTIL HFA;VENTOLIN HFA) 108 (90 BASE) MCG/ACT  inhaler Inhale 2 puffs into the lungs every 6 (six) hours as needed. For shortness of breath 1 Inhaler 6  . amLODipine (NORVASC) 10 MG tablet 2 (two) times daily.  3  . aspirin (ASPIRIN CHILDRENS) 81 MG chewable tablet Chew 81 mg by mouth daily.      . carvedilol (COREG) 12.5 MG tablet Take 1 tablet (12.5 mg total) by mouth 2 (two) times daily with a meal. 60 tablet 2  . cephALEXin (KEFLEX) 500 MG capsule Take 1 capsule (500 mg total) by mouth 3 (three) times daily. 18 capsule 0  . colchicine 0.6 MG tablet Take 0.5 tablets (0.3 mg total) by mouth daily. 30 tablet 0  . diclofenac sodium (VOLTAREN) 1 % GEL Apply 2 g topically 4 (four) times daily. 100 g 3  . furosemide (LASIX) 20 MG tablet Take 20 mg by mouth daily.   3  . gabapentin (NEURONTIN) 300 MG capsule Take 300 mg by mouth 2 (two) times daily.     Marland Kitchen glucose blood test strip Use as instructed Dx. Code 250.02 100 each 12  . losartan (COZAAR) 100 MG tablet TAKE 1 TABLET (100 MG TOTAL) BY MOUTH AT BEDTIME. 30 tablet 4  . magnesium hydroxide (MILK OF MAGNESIA) 400 MG/5ML suspension Take 30 mLs by mouth daily as needed for mild constipation.    . metFORMIN (GLUCOPHAGE) 500 MG tablet Take 2 tablets (1,000 mg total) by mouth 2 (two) times daily with a meal. 120 tablet 1  . mirtazapine (REMERON) 15 MG tablet Take 15 mg by mouth at bedtime as needed (sleep).    . ondansetron (ZOFRAN) 4 MG tablet Take 1 tablet (4 mg total) by mouth every 8 (eight) hours as needed for nausea or vomiting. 20 tablet 0  . PATADAY 0.2 % SOLN Place 1 drop into both eyes daily as needed. For itchy eyes    . ranitidine (ZANTAC) 150 MG capsule Take 1 capsule (150 mg total) by mouth 2 (two) times daily as needed for heartburn. 60 capsule 3  . rosuvastatin (CRESTOR) 20 MG tablet Take 1 tablet (20 mg total) by mouth at bedtime. 30 tablet 2  . tamoxifen (NOLVADEX) 20 MG tablet daily.    . traMADol (ULTRAM) 50 MG tablet Take 1 tablet (50 mg total) by mouth every 12 (twelve) hours  as needed for severe pain. 30 tablet 0  . [DISCONTINUED] cetirizine (ZYRTEC) 5 MG tablet Take 1 tablet (5 mg total) by mouth daily. Take at night time as it can cause some drowsiness 30 tablet 4   No current facility-administered medications for this visit.    Objective: BP 117/62 mmHg  Pulse 92  Temp(Src) 98.4 F (36.9 C) (Oral)  Ht 4\' 11"  (1.499 m)  Wt 118 lb (53.524 kg)  BMI 23.82 kg/m2 Gen: No acute distress. Alert, cooperative with exam HEENT: Atraumatic. Oropharynx clear. MMM CV: RRR. No murmurs, rubs, or gallops noted. 1+ radial and DP pulses bilaterally. Resp: CTAB. No wheezing, crackles, or rhonchi noted. Ext: No edema. No gross deformities. Neuro: Alert and oriented, sensation intact to monofilament exam bilaterally except over the aspect of the heel where her feet are very calloused. No pain over the bony processes of the spine or over the paraspinal muscles. Good flexion and  extension of the spine. The patient has a slow but steady gait with the assistance of a rolling walker  BMP Latest Ref Rng 10/26/2014 09/20/2014 09/19/2014  Glucose 70 - 99 mg/dL 118(H) 215(H) 142(H)  BUN 6 - 23 mg/dL 17 15 16   Creatinine 0.50 - 1.10 mg/dL 1.01 1.18(H) 1.06  Sodium 135 - 145 mEq/L 141 139 134(L)  Potassium 3.5 - 5.3 mEq/L 4.0 4.4 4.1  Chloride 96 - 112 mEq/L 107 102 98  CO2 19 - 32 mEq/L 24 24 23   Calcium 8.4 - 10.5 mg/dL 10.2 10.0 10.4     Assessment/Plan:  HYPERTENSION, BENIGN SYSTEMIC Blood pressure is well controlled on the patient's new regimen of Coreg, Lasix, and losartan. The patient did have elevation of her creatinine during that previous hospitalization and on repeat lab work in November.  -We will repeat a BMET today -Continue with home regimen -Follow-up in 3 months    Thoracic back pain Patient endorses continued back pain on occasion. No back pain currently and no pain elicited on exam. No red flags in history or on exam. The patient had an x-ray in May 2015  which revealed thoracic scoliosis with multilevel degenerative disc disease in the mid thoracic spine which is the probable etiology of her pain. If the patient's pain continues to worsen or changes in nature, will consider a repeat image given the patient's history of both breast and colon cancer.  -Prescribed tramadol with the discussion about balance and drowsiness. The patient advises me that she only takes this when she is at home for the evening. -Advised the patient to return to clinic if her symptoms worsen or change in nature.  Diabetes mellitus type 2, uncontrolled, without complications The patient appears to be tolerating metformin 1000 mg twice a day well. Monofilament exam within normal limits. Retinal exam not up to date.  -We'll refer the patient to ophthalmology -If patient continues to tolerate metformin, will change to XR formula -We will repeat A1c at next appointment.    Orders Placed This Encounter  Procedures  . Basic metabolic panel  . Ambulatory referral to Ophthalmology    Referral Priority:  Routine    Referral Type:  Consultation    Referral Reason:  Specialty Services Required    Requested Specialty:  Ophthalmology    Number of Visits Requested:  1    Meds ordered this encounter  Medications  . traMADol (ULTRAM) 50 MG tablet    Sig: Take 1 tablet (50 mg total) by mouth every 12 (twelve) hours as needed for severe pain.    Dispense:  30 tablet    Refill:  0

## 2014-10-28 NOTE — Assessment & Plan Note (Signed)
Blood pressure is well controlled on the patient's new regimen of Coreg, Lasix, and losartan. The patient did have elevation of her creatinine during that previous hospitalization and on repeat lab work in November.  -We will repeat a BMET today -Continue with home regimen -Follow-up in 3 months

## 2014-10-28 NOTE — Assessment & Plan Note (Signed)
Patient endorses continued back pain on occasion. No back pain currently and no pain elicited on exam. No red flags in history or on exam. The patient had an x-ray in May 2015 which revealed thoracic scoliosis with multilevel degenerative disc disease in the mid thoracic spine which is the probable etiology of her pain. If the patient's pain continues to worsen or changes in nature, will consider a repeat image given the patient's history of both breast and colon cancer.  -Prescribed tramadol with the discussion about balance and drowsiness. The patient advises me that she only takes this when she is at home for the evening. -Advised the patient to return to clinic if her symptoms worsen or change in nature.

## 2014-10-28 NOTE — Assessment & Plan Note (Signed)
The patient appears to be tolerating metformin 1000 mg twice a day well. Monofilament exam within normal limits. Retinal exam not up to date.  -We'll refer the patient to ophthalmology -If patient continues to tolerate metformin, will change to XR formula -We will repeat A1c at next appointment.

## 2014-10-29 ENCOUNTER — Other Ambulatory Visit: Payer: Self-pay | Admitting: Family Medicine

## 2014-11-10 IMAGING — CT CT HEAD W/O CM
1 series · 16 of 30 positions shown, 20 images · non-contrast
Comparison: PET-CT 04/08/2014

CLINICAL DATA: Dizziness and hypertension.

EXAM:
CT HEAD WITHOUT CONTRAST
TECHNIQUE: Contiguous axial images were obtained from the base of the skull
through the vertex without intravenous contrast.

[Series 2: head 5.0 h30s · axial · 0.41mm/px · z∈[+1167,+1302]mm · 16 of 31 slices shown, 20 images]
[im 2/31  brain]
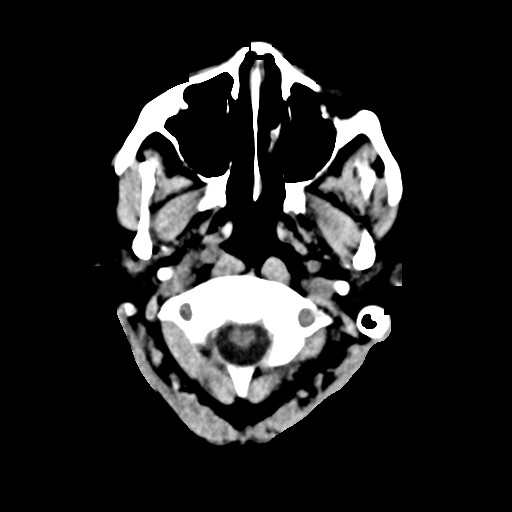
[im 2/31  bone]
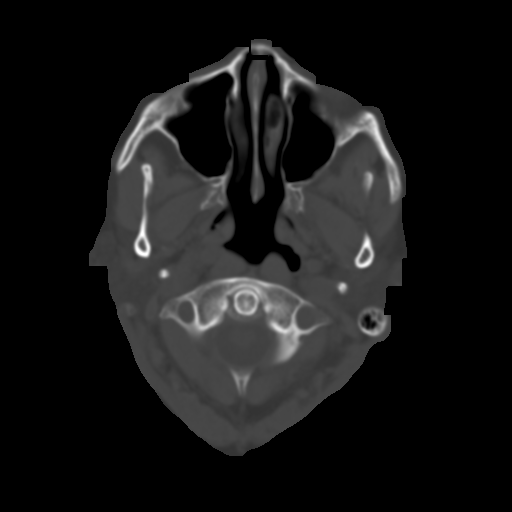
[im 4/31  brain]
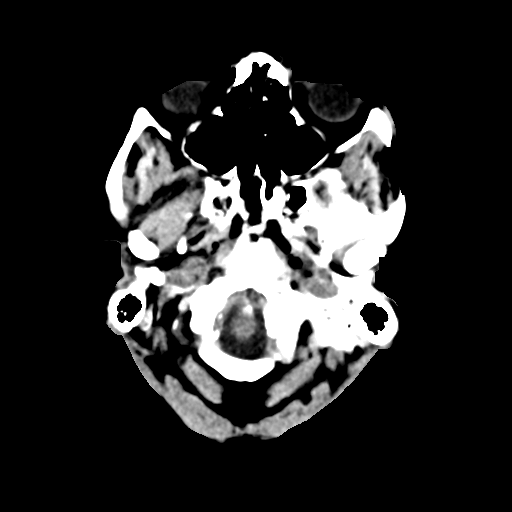
[im 6/31  brain]
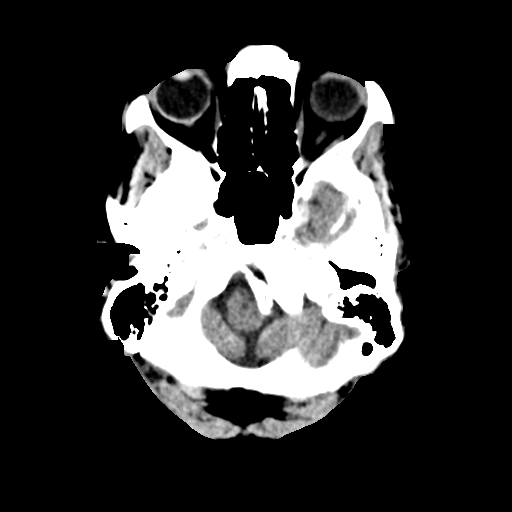
[im 8/31  brain]
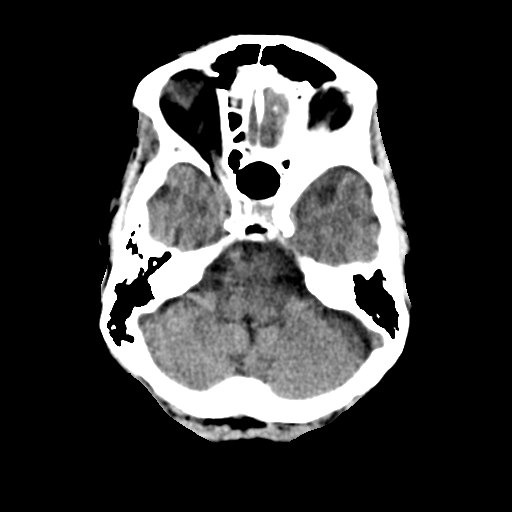
[im 9/31  brain]
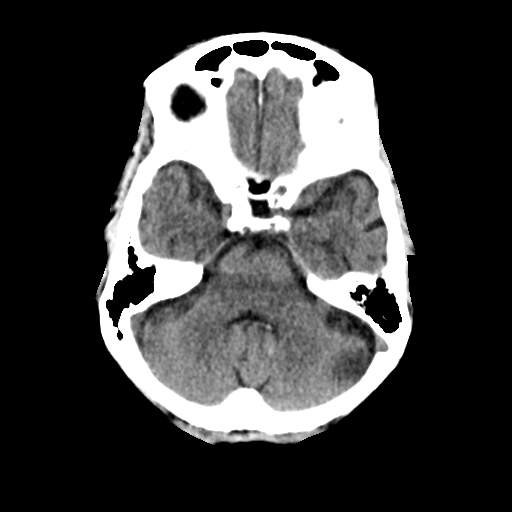
[im 9/31  bone]
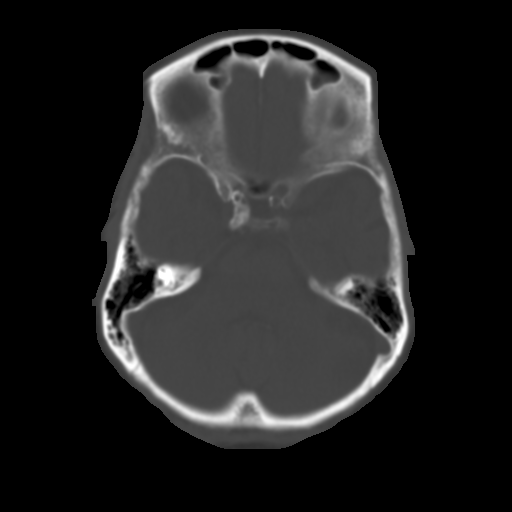
[im 11/31  brain]
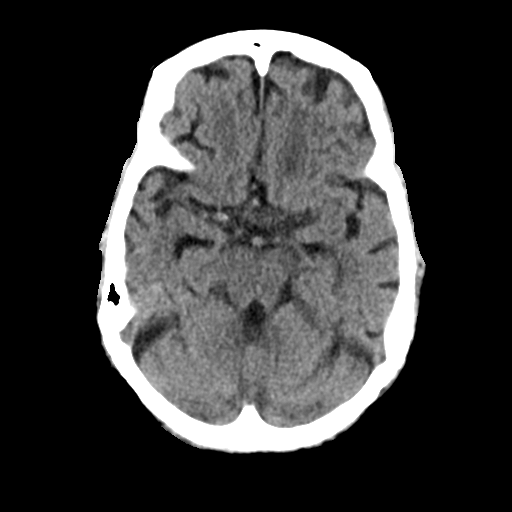
[im 13/31  brain]
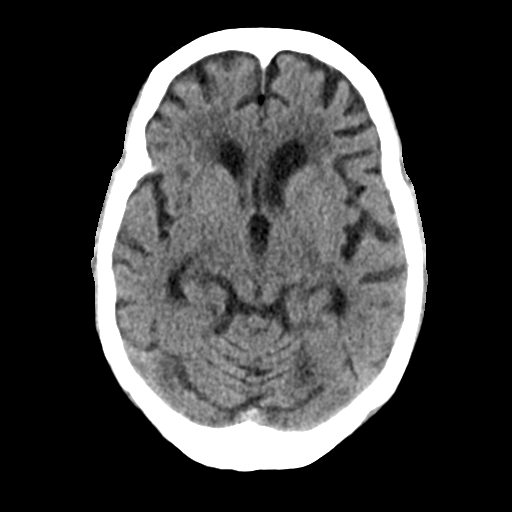
[im 15/31  brain]
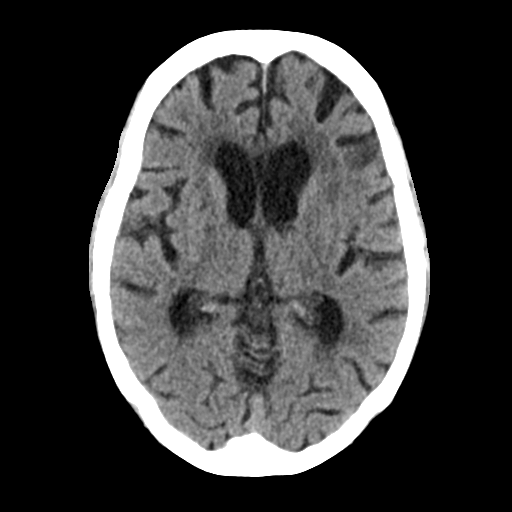
[im 16/31  brain]
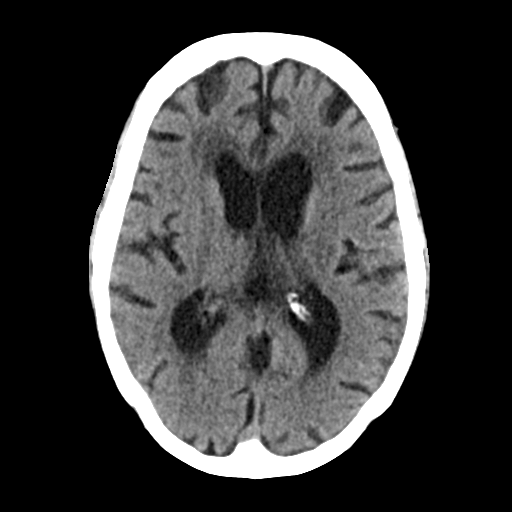
[im 16/31  bone]
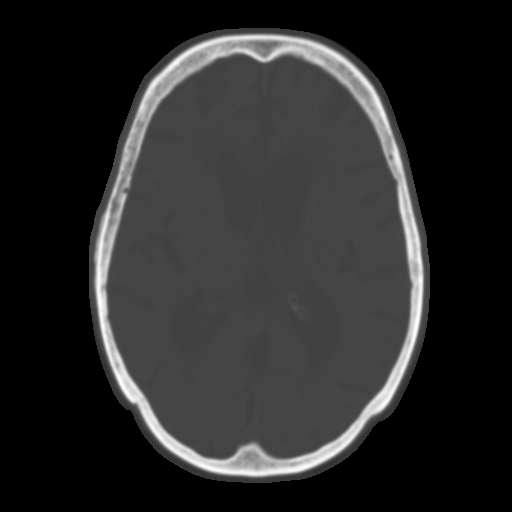
[im 18/31  brain]
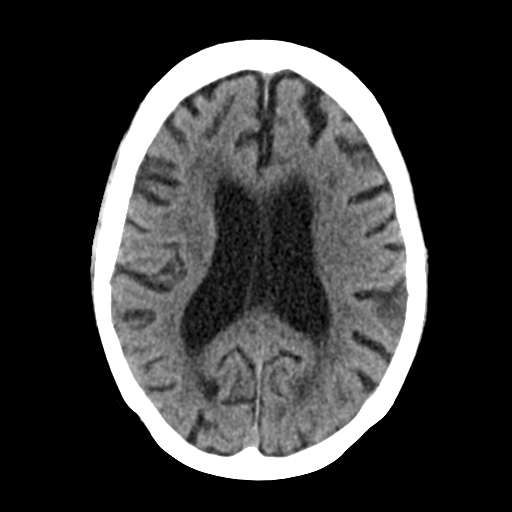
[im 20/31  brain]
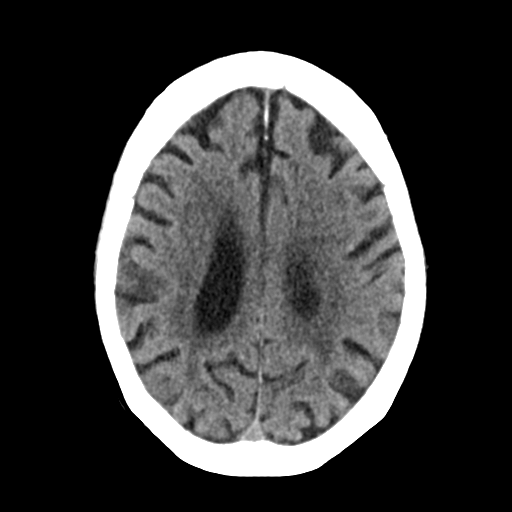
[im 22/31  brain]
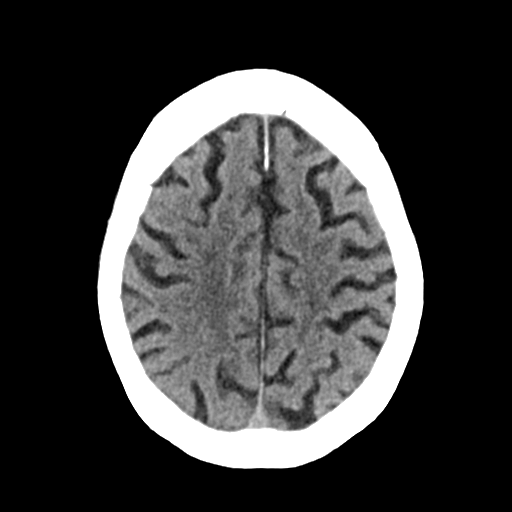
[im 23/31  brain]
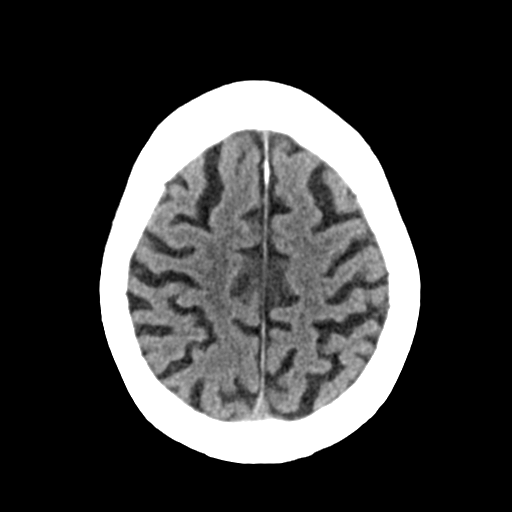
[im 23/31  bone]
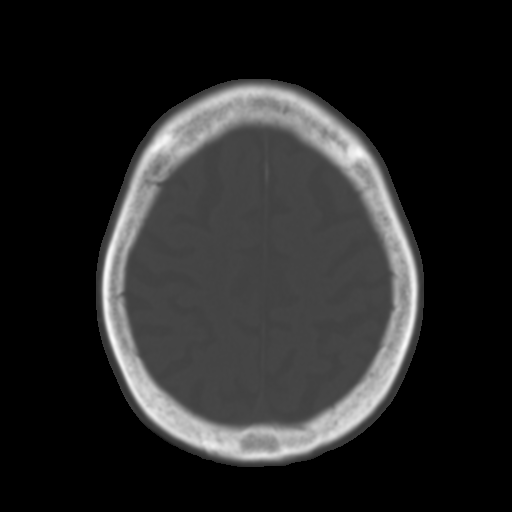
[im 25/31  brain]
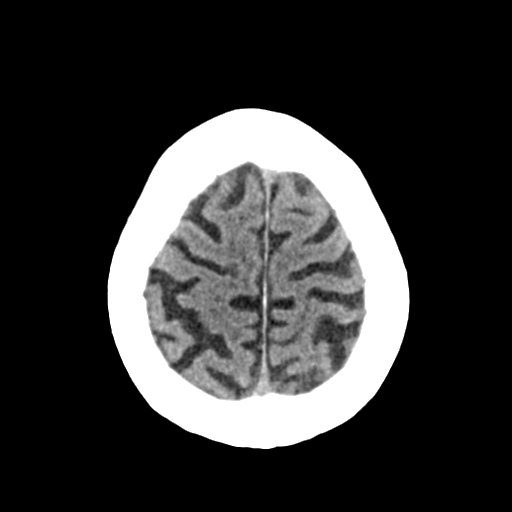
[im 27/31  brain]
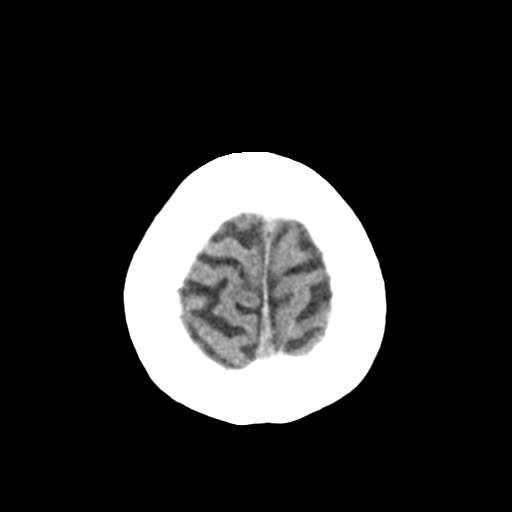
[im 29/31  brain]
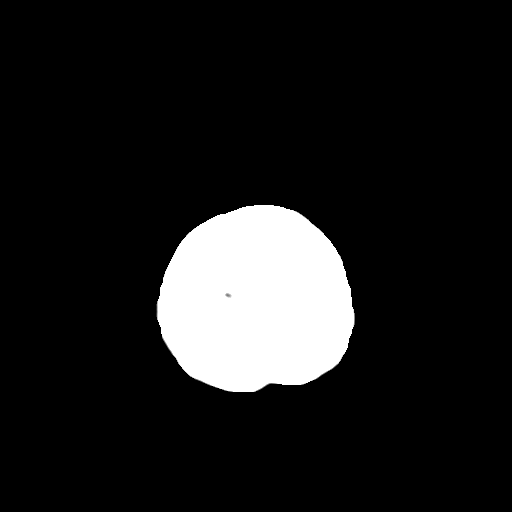

[16 of 30 positions shown; findings below may reference images not displayed]

FINDINGS: No acute intracranial hemorrhage. No focal mass lesion. No CT
evidence of acute infarction. No midline shift or mass effect. No
hydrocephalus. Basilar cisterns are patent.

There is generalized cortical atrophy and proportional ventricular
dilatation unchanged from prior. There is extensive periventricular
white matter hypodensities.

Paranasal sinuses and  mastoid air cells are clear.
IMPRESSION: 1. No acute intracranial findings.
2. Atrophy and white matter microvascular disease are not changed.

## 2014-11-12 IMAGING — CR DG CHEST 2V
1 series · 1 of 1 positions shown · non-contrast
Comparison: 04/29/2014

CLINICAL DATA: Cough, congestion for 1 week. Fever. History of
breast cancer diagnosed in 6288.

EXAM:
CHEST  2 VIEW

[view not recorded]
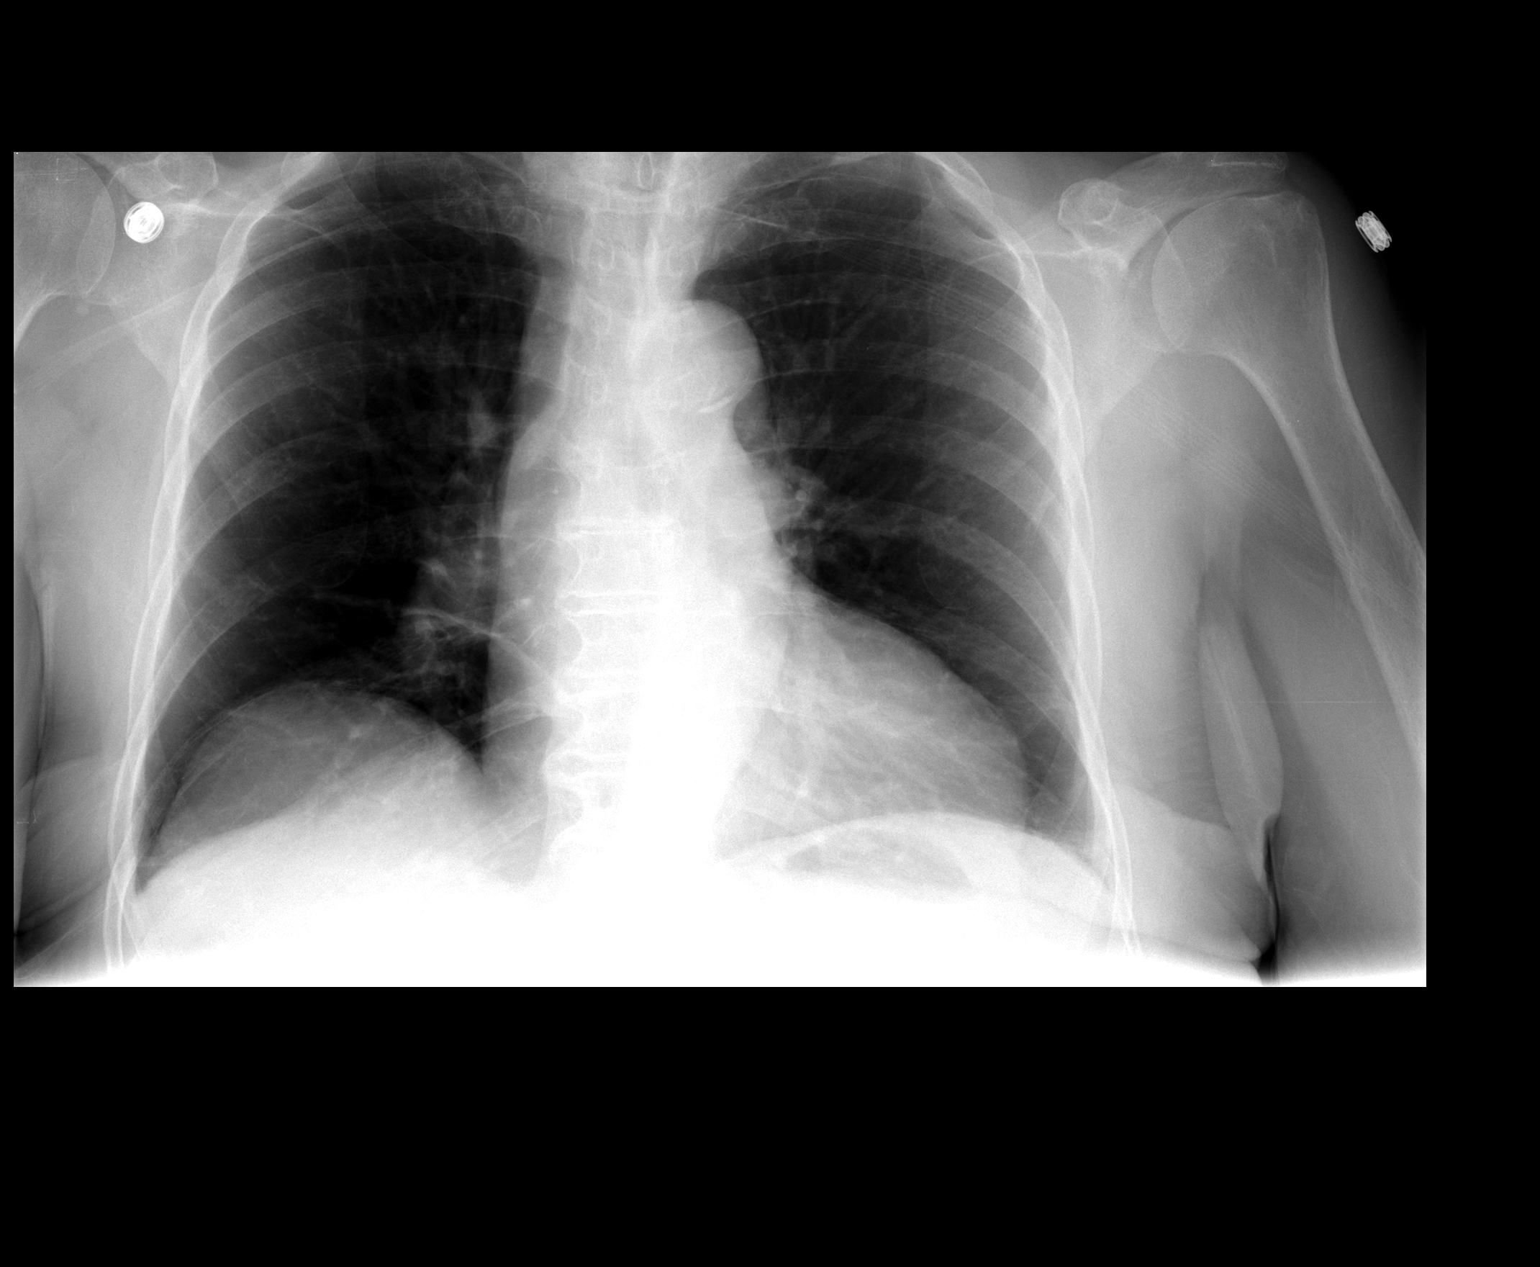

[1 of 1 positions shown; findings below may reference images not displayed]

FINDINGS: Linear scarring noted in the right lung base. Eventration of the
right hemidiaphragm, stable. No acute airspace opacities or
effusions. Heart is borderline in size. No acute bony abnormality.
IMPRESSION: No active cardiopulmonary disease.

## 2014-11-17 DIAGNOSIS — H3531 Nonexudative age-related macular degeneration: Secondary | ICD-10-CM | POA: Diagnosis not present

## 2014-11-17 DIAGNOSIS — H2513 Age-related nuclear cataract, bilateral: Secondary | ICD-10-CM | POA: Diagnosis not present

## 2014-11-17 DIAGNOSIS — H3562 Retinal hemorrhage, left eye: Secondary | ICD-10-CM | POA: Diagnosis not present

## 2014-11-17 LAB — HM DIABETES EYE EXAM

## 2014-11-25 ENCOUNTER — Ambulatory Visit (INDEPENDENT_AMBULATORY_CARE_PROVIDER_SITE_OTHER): Payer: Medicare Other | Admitting: Podiatry

## 2014-11-25 ENCOUNTER — Other Ambulatory Visit: Payer: Self-pay | Admitting: *Deleted

## 2014-11-25 ENCOUNTER — Encounter: Payer: Self-pay | Admitting: Podiatry

## 2014-11-25 VITALS — BP 99/76 | HR 80 | Resp 12

## 2014-11-25 DIAGNOSIS — M79676 Pain in unspecified toe(s): Secondary | ICD-10-CM | POA: Diagnosis not present

## 2014-11-25 DIAGNOSIS — M546 Pain in thoracic spine: Secondary | ICD-10-CM

## 2014-11-25 DIAGNOSIS — B351 Tinea unguium: Secondary | ICD-10-CM | POA: Diagnosis not present

## 2014-11-25 NOTE — Progress Notes (Signed)
Patient ID: [name], female   DOB: Oct 05, 1924, 81 y.o.   MRN: [id number]  Subjective: This patient presents complaining of painful toenails when walking wearing shoes  Objective: Orientated 3 The toenails are elongated, brittle, incurvated, discolored, hypertrophic and tender to palpation 6-10  Assessment: Type II diabetic Symptomatic onychomycoses 6-10  Plan: Debrided toenails 10 without a bleeding  Reappoint 3 months

## 2014-11-25 NOTE — Patient Instructions (Signed)
Diabetes and Foot Care Diabetes may cause you to have problems because of poor blood supply (circulation) to your feet and legs. This may cause the skin on your feet to become thinner, break easier, and heal more slowly. Your skin may become dry, and the skin may peel and crack. You may also have nerve damage in your legs and feet causing decreased feeling in them. You may not notice minor injuries to your feet that could lead to infections or more serious problems. Taking care of your feet is one of the most important things you can do for yourself.  HOME CARE INSTRUCTIONS  Wear shoes at all times, even in the house. Do not go barefoot. Bare feet are easily injured.  Check your feet daily for blisters, cuts, and redness. If you cannot see the bottom of your feet, use a mirror or ask someone for help.  Wash your feet with warm water (do not use hot water) and mild soap. Then pat your feet and the areas between your toes until they are completely dry. Do not soak your feet as this can dry your skin.  Apply a moisturizing lotion or petroleum jelly (that does not contain alcohol and is unscented) to the skin on your feet and to dry, brittle toenails. Do not apply lotion between your toes.  Trim your toenails straight across. Do not dig under them or around the cuticle. File the edges of your nails with an emery board or nail file.  Do not cut corns or calluses or try to remove them with medicine.  Wear clean socks or stockings every day. Make sure they are not too tight. Do not wear knee-high stockings since they may decrease blood flow to your legs.  Wear shoes that fit properly and have enough cushioning. To break in new shoes, wear them for just a few hours a day. This prevents you from injuring your feet. Always look in your shoes before you put them on to be sure there are no objects inside.  Do not cross your legs. This may decrease the blood flow to your feet.  If you find a minor scrape,  cut, or break in the skin on your feet, keep it and the skin around it clean and dry. These areas may be cleansed with mild soap and water. Do not cleanse the area with peroxide, alcohol, or iodine.  When you remove an adhesive bandage, be sure not to damage the skin around it.  If you have a wound, look at it several times a day to make sure it is healing.  Do not use heating pads or hot water bottles. They may burn your skin. If you have lost feeling in your feet or legs, you may not know it is happening until it is too late.  Make sure your health care provider performs a complete foot exam at least annually or more often if you have foot problems. Report any cuts, sores, or bruises to your health care provider immediately. SEEK MEDICAL CARE IF:   You have an injury that is not healing.  You have cuts or breaks in the skin.  You have an ingrown nail.  You notice redness on your legs or feet.  You feel burning or tingling in your legs or feet.  You have pain or cramps in your legs and feet.  Your legs or feet are numb.  Your feet always feel cold. SEEK IMMEDIATE MEDICAL CARE IF:   There is increasing redness,   swelling, or pain in or around a wound.  There is a red line that goes up your leg.  Pus is coming from a wound.  You develop a fever or as directed by your health care provider.  You notice a bad smell coming from an ulcer or wound. Document Released: 10/27/2000 Document Revised: 07/02/2013 Document Reviewed: 04/08/2013 [employer] Patient Information 2015 [employer]. This information is not intended to replace advice given to you by your health care provider. Make sure you discuss any questions you have with your health care provider.  

## 2014-11-26 ENCOUNTER — Encounter: Payer: Self-pay | Admitting: Family Medicine

## 2014-11-26 ENCOUNTER — Ambulatory Visit (INDEPENDENT_AMBULATORY_CARE_PROVIDER_SITE_OTHER): Payer: Medicare Other | Admitting: Family Medicine

## 2014-11-26 VITALS — BP 138/76 | HR 96 | Temp 97.6°F | Ht 59.0 in | Wt 118.3 lb

## 2014-11-26 DIAGNOSIS — M546 Pain in thoracic spine: Secondary | ICD-10-CM

## 2014-11-26 DIAGNOSIS — R350 Frequency of micturition: Secondary | ICD-10-CM | POA: Diagnosis not present

## 2014-11-26 DIAGNOSIS — R058 Other specified cough: Secondary | ICD-10-CM

## 2014-11-26 DIAGNOSIS — R05 Cough: Secondary | ICD-10-CM

## 2014-11-26 DIAGNOSIS — R112 Nausea with vomiting, unspecified: Secondary | ICD-10-CM | POA: Diagnosis present

## 2014-11-26 DIAGNOSIS — N3001 Acute cystitis with hematuria: Secondary | ICD-10-CM | POA: Diagnosis not present

## 2014-11-26 LAB — POCT URINALYSIS DIPSTICK
Bilirubin, UA: NEGATIVE
Glucose, UA: NEGATIVE
Ketones, UA: NEGATIVE
Nitrite, UA: POSITIVE
Protein, UA: NEGATIVE
Spec Grav, UA: 1.015
Urobilinogen, UA: 0.2
pH, UA: 6

## 2014-11-26 LAB — POCT UA - MICROSCOPIC ONLY

## 2014-11-26 MED ORDER — CYCLOBENZAPRINE HCL 5 MG PO TABS: 2.5000 mg | ORAL_TABLET | Freq: Three times a day (TID) | ORAL | Status: DC | PRN

## 2014-11-26 MED ORDER — TRAMADOL HCL 50 MG PO TABS: 50.0000 mg | ORAL_TABLET | Freq: Two times a day (BID) | ORAL | Status: DC | PRN

## 2014-11-26 MED ORDER — LEVOFLOXACIN 250 MG PO TABS: 250.0000 mg | ORAL_TABLET | Freq: Every day | ORAL | Status: DC

## 2014-11-26 MED ORDER — BENZONATATE 100 MG PO CAPS: 100.0000 mg | ORAL_CAPSULE | Freq: Two times a day (BID) | ORAL | Status: DC | PRN

## 2014-11-26 NOTE — Telephone Encounter (Signed)
Pt informed. Deseree Blount, CMA  

## 2014-11-26 NOTE — Telephone Encounter (Signed)
Please let the patient know that her Rx is up front.  Thanks, [name], MD [facility] Resident  11/26/2014, 8:16 AM

## 2014-11-26 NOTE — Patient Instructions (Addendum)
Dear [name], Thank you for coming in to clinic today. It was good to see you!  1. It sounds like your back pain is caused by your muscles, and is worsened with your recent cough. 2. Your cough sounds like you have a viral respiratory infection - this should continue to improve on its own, but it may take up to 2 weeks to resolve. I do not see any signs of pneumonia at this time. 3. We will check your urine today given your back pain and urinary frequency. If positive I will call in an antibiotic. 4. Sent prescription for Flexeril take half to a whole tablet up to twice daily as needed for back pain 5. Take Benzonatate up to 3 times daily for cough as needed. Continue Albuterol inhaler at night. 6. Take Tylenol 1000mg  up to 3 times daily (every 6-8 hours) - NO MORE THAN 6 tablets in 24 hours  If symptoms are getting worse with worsening cough, fevers/chills, chest pain, shortness of breath, burning with urination, nausea / vomiting, please call or return to clinic sooner, otherwise may go to Emergency Dept.   Please schedule a follow-up appointment with Dr. Lorenso Courier in 1-2 weeks if symptoms persist. Return sooner if worsening.  If you have any other questions or concerns, please feel free to call the clinic to contact me. You may also schedule an earlier appointment if necessary.  However, if your symptoms get significantly worse, please go to the Emergency Department to seek immediate medical attention.  [name], [place]

## 2014-11-26 NOTE — Progress Notes (Signed)
   Subjective:    Patient ID: [name], female    DOB: July 29, 1924, 81 y.o.   MRN: [id number]  Patient presents for a same day appointment.  HPI  BACK / FLANK PAIN: - Reported that Left sided mid back pain described as "burning" about 2 days ago, seemed "different and worse" than chronic back pain with history of OA/DJD. Last seen at [facility] 10/26/14 for her chronic back pain thought to be due to OA, given tramadol, pt has not picked up rx yet - Today states that the Left sided back pain is improved, and feels more similar to her "regular back pain". Denies any accident, injury, or fall to trigger this pain. Continues to ambulate well with walker around house, does not seem to be limiting function. - Currently taking Tylenol 1g x 1 dose daily (with good relief). Uses heating pad with relief. - Requested muscle relaxant (previously helped pain before) - Admits to increased urinary frequency and nocturia (states family is concerned she may have UTI) - Denies dysuria, hematuria, urinary odor, n/v, radiating pain to legs, numbness, weakness, tingling  COUGH: - Reports recent cough since Sunday, about 5 days with worsening coughing and admits sometimes productive. Otherwise, without other URI symptoms. Denies any sick contacts. States "I've been sick before, and I don't feel sick, they just wanted me to get checked out". - Albuterol inhaler does help, uses at night for past few days with good relief - Denies fevers/chills, congestion, CP, SOB, abdominal pain  I have reviewed and updated the following as appropriate: allergies and current medications  Social Hx: - Never smoker  Review of Systems  See above HPI     Objective:   Physical Exam  BP 138/76 mmHg  Pulse 96  Temp(Src) 97.6 F (36.4 C) (Oral)  Ht 4\' 11"  (1.499 m)  Wt 118 lb 4.8 oz (53.661 kg)  BMI 23.88 kg/m2  Gen - well-appearing elderly F, pleasant and comfortable, NAD HEENT - patent nares w/o congestion, oropharynx  clear, MMM Heart - RRR, no murmurs heard Lungs - CTAB, no wheezing, crackles, or rhonchi. Normal work of breathing., MSK - Back: non-tender to palpation over spinous processes, Left mid thoracic minimally tender to touch with mild muscle hypertonicity. Negative CVAT. Ext - non-tender, no edema, peripheral pulses intact +2 b/l Skin - warm, dry Neuro - awake, alert, oriented     Assessment & Plan:   See specific A&P problem list for details.

## 2014-11-27 NOTE — Assessment & Plan Note (Addendum)
Improving, intermittently productive cough without other URI symptoms. Improves with intermittent Albuterol use. Likely component of asthma vs viral URI, unlikely PNA. - afebrile, well-appearing, VSS, pulse ox 99%. Lungs clear w/o focal findings.  Plan: 1. Rx Benzonatate 100mg  PRN (requested as previously helped pt before) 2. inc hydration, cont Albuterol PRN 3. RTC 1 week if worsening, red flags given

## 2014-11-27 NOTE — Assessment & Plan Note (Signed)
Stable chronic thoracic back pain with known OA/DJD, additionally with reportedly new L-sided back pain seems improved today, questionable if related to UTI. - No back pain red flags. No radicular symptoms  Plan: 1. Rx Flexeril 2.5-5mg  PRN (per patient request, worked before for pain) 2. Patient unable to pick-up Tramadol rx from pharmacy since last visit - did not refill today, but will notify PCP 3. Cont Tylenol PRN, heating pad 4. RTC for routine follow-up or if worsening

## 2014-11-27 NOTE — Assessment & Plan Note (Signed)
Consistent with possible uncomplicated cystitis with inc urinary freq / nocturia, no other distinct urinary symptoms, additionally with back/flank pain, otherwise no evidence of pyelo or acute infection. Overall patient well appearing, may be asymptomatic bacteruria, however given new complaints within few days including flank pain decision with patient to treat if UA positive. - afebrile, VSS  Plan: 1. Check UA - supportive of UTI (positive nitrite, large leuks, small rbc, WBC loaded) 2. Ordered Urine culture - (previously with E.Coli infections / colonizations in past) 3. Levaquin 250mg  PO daily x 3 days - (patient declined rx Keflex - does not tolerate these pills and last time did not take them, also reviewed prior culture sensitivities with Bactrim resistance) 4. RTC 1 week if worsening symptoms

## 2014-11-29 LAB — URINE CULTURE: Colony Count: >=100000

## 2014-12-05 ENCOUNTER — Other Ambulatory Visit: Payer: Self-pay | Admitting: Family Medicine

## 2014-12-07 DIAGNOSIS — H2513 Age-related nuclear cataract, bilateral: Secondary | ICD-10-CM | POA: Diagnosis not present

## 2014-12-07 DIAGNOSIS — H2511 Age-related nuclear cataract, right eye: Secondary | ICD-10-CM | POA: Diagnosis not present

## 2014-12-10 ENCOUNTER — Telehealth: Payer: Self-pay | Admitting: Hematology and Oncology

## 2014-12-10 ENCOUNTER — Other Ambulatory Visit (HOSPITAL_BASED_OUTPATIENT_CLINIC_OR_DEPARTMENT_OTHER): Payer: Medicare Other

## 2014-12-10 ENCOUNTER — Ambulatory Visit (HOSPITAL_BASED_OUTPATIENT_CLINIC_OR_DEPARTMENT_OTHER): Payer: Medicare Other | Admitting: Hematology and Oncology

## 2014-12-10 ENCOUNTER — Encounter: Payer: Self-pay | Admitting: Hematology and Oncology

## 2014-12-10 VITALS — BP 165/58 | HR 81 | Temp 99.3°F | Resp 18 | Ht 59.0 in | Wt 118.4 lb

## 2014-12-10 DIAGNOSIS — C50919 Malignant neoplasm of unspecified site of unspecified female breast: Secondary | ICD-10-CM

## 2014-12-10 DIAGNOSIS — D509 Iron deficiency anemia, unspecified: Secondary | ICD-10-CM

## 2014-12-10 DIAGNOSIS — C50912 Malignant neoplasm of unspecified site of left female breast: Secondary | ICD-10-CM

## 2014-12-10 DIAGNOSIS — D638 Anemia in other chronic diseases classified elsewhere: Secondary | ICD-10-CM

## 2014-12-10 DIAGNOSIS — Z85038 Personal history of other malignant neoplasm of large intestine: Secondary | ICD-10-CM

## 2014-12-10 DIAGNOSIS — M546 Pain in thoracic spine: Secondary | ICD-10-CM | POA: Diagnosis not present

## 2014-12-10 DIAGNOSIS — C189 Malignant neoplasm of colon, unspecified: Secondary | ICD-10-CM

## 2014-12-10 DIAGNOSIS — R112 Nausea with vomiting, unspecified: Secondary | ICD-10-CM | POA: Diagnosis not present

## 2014-12-10 LAB — CBC WITH DIFFERENTIAL/PLATELET
BASO%: 0.3 % (ref 0.0–2.0)
Basophils Absolute: 0 10*3/uL (ref 0.0–0.1)
EOS%: 2.1 % (ref 0.0–7.0)
Eosinophils Absolute: 0.1 10*3/uL (ref 0.0–0.5)
HEMATOCRIT: 28.9 % — AB (ref 34.8–46.6)
HGB: 9.1 g/dL — ABNORMAL LOW (ref 11.6–15.9)
LYMPH%: 32.1 % (ref 14.0–49.7)
MCH: 22.1 pg — AB (ref 25.1–34.0)
MCHC: 31.5 g/dL (ref 31.5–36.0)
MCV: 70.3 fL — AB (ref 79.5–101.0)
MONO#: 0.3 10*3/uL (ref 0.1–0.9)
MONO%: 6.4 % (ref 0.0–14.0)
NEUT#: 2.3 10*3/uL (ref 1.5–6.5)
NEUT%: 59.1 % (ref 38.4–76.8)
Platelets: 152 10*3/uL (ref 145–400)
RBC: 4.11 10*6/uL (ref 3.70–5.45)
RDW: 17 % — ABNORMAL HIGH (ref 11.2–14.5)
WBC: 3.9 10*3/uL (ref 3.9–10.3)
lymph#: 1.3 10*3/uL (ref 0.9–3.3)
nRBC: 0 % (ref 0–0)

## 2014-12-10 LAB — COMPREHENSIVE METABOLIC PANEL (CC13)
ALT: <6 U/L (ref 0–55)
AST: 10 U/L (ref 5–34)
Albumin: 3.5 g/dL (ref 3.5–5.0)
Alkaline Phosphatase: 38 U/L — ABNORMAL LOW (ref 40–150)
Anion Gap: 11 meq/L (ref 3–11)
BUN: 18.3 mg/dL (ref 7.0–26.0)
CO2: 24 meq/L (ref 22–29)
Calcium: 10.1 mg/dL (ref 8.4–10.4)
Chloride: 108 meq/L (ref 98–109)
Creatinine: 1.2 mg/dL — ABNORMAL HIGH (ref 0.6–1.1)
EGFR: 49 ml/min/1.73 m2 — ABNORMAL LOW
Glucose: 141 mg/dL — ABNORMAL HIGH (ref 70–140)
Potassium: 3.7 meq/L (ref 3.5–5.1)
Sodium: 143 meq/L (ref 136–145)
Total Bilirubin: 0.28 mg/dL (ref 0.20–1.20)
Total Protein: 7.1 g/dL (ref 6.4–8.3)

## 2014-12-10 NOTE — Assessment & Plan Note (Signed)
This is likely anemia of chronic disease. The patient denies recent history of bleeding such as epistaxis, hematuria or hematochezia. She is asymptomatic from the anemia. We will observe for now.  She does not require transfusion now.   

## 2014-12-10 NOTE — Assessment & Plan Note (Signed)
Clinically, she has no recurrence. She'll continue on tamoxifen until January 2017 to complete 5 years with of adjuvant treatment. So far, she tolerated treatment well without any side effects.

## 2014-12-10 NOTE — Assessment & Plan Note (Signed)
She has no evidence of recurrence. Continue history and examination only.

## 2014-12-10 NOTE — Progress Notes (Signed)
[place] OFFICE PROGRESS NOTE  Patient Care Team: [name], MD as PCP - General Heath Lark, MD as Consulting Physician (Hematology and Oncology)  SUMMARY OF ONCOLOGIC HISTORY:  This is a pleasant lady who was found to have problems with breast cancer from screening mammogram. She has simple bilateral mastectomies and bilateral axillary lymph node dissection in 2001. On the right breast, showed a T1N0M0 left breast to have T1N1M0, both of them stained strongly for estrogen receptor positivity. She also have remote history of colon cancer. She was placed on tamoxifen from January 2012.  INTERVAL HISTORY: Please see below for problem oriented charting. She denies any recent abnormal breast examination, palpable mass, abnormal breast appearance or nipple changes She denies new change in bowel habits. She had recent upper respiratory tract infection, resolving with antibiotics.  REVIEW OF SYSTEMS:   Constitutional: Denies fevers, chills or abnormal weight loss Eyes: Denies blurriness of vision Ears, nose, mouth, throat, and face: Denies mucositis or sore throat Respiratory: Denies cough, dyspnea or wheezes Cardiovascular: Denies palpitation, chest discomfort or lower extremity swelling Gastrointestinal:  Denies nausea, heartburn or change in bowel habits Skin: Denies abnormal skin rashes Lymphatics: Denies new lymphadenopathy or easy bruising Neurological:Denies numbness, tingling or new weaknesses Behavioral/Psych: Mood is stable, no new changes  All other systems were reviewed with the patient and are negative.  I have reviewed the past medical history, past surgical history, social history and family history with the patient and they are unchanged from previous note.  ALLERGIES:  is allergic to peanuts and lisinopril.  MEDICATIONS:  Current Outpatient Prescriptions  Medication Sig Dispense Refill  . acetaminophen (TYLENOL) 500 MG tablet Take 1-2 tablets  (500-1,000 mg total) by mouth every 8 (eight) hours as needed. 30 tablet 0  . albuterol (PROVENTIL HFA;VENTOLIN HFA) 108 (90 BASE) MCG/ACT inhaler Inhale 2 puffs into the lungs every 6 (six) hours as needed. For shortness of breath 1 Inhaler 6  . amLODipine (NORVASC) 10 MG tablet 2 (two) times daily.  3  . aspirin (ASPIRIN CHILDRENS) 81 MG chewable tablet Chew 81 mg by mouth daily.      . carvedilol (COREG) 12.5 MG tablet TAKE 1 TABLET (12.5 MG TOTAL) BY MOUTH 2 (TWO) TIMES DAILY WITH A MEAL. 60 tablet 2  . colchicine 0.6 MG tablet Take 0.5 tablets (0.3 mg total) by mouth daily. 30 tablet 0  . cyclobenzaprine (FLEXERIL) 5 MG tablet Take 0.5-1 tablets (2.5-5 mg total) by mouth 3 (three) times daily as needed for muscle spasms. 30 tablet 1  . diclofenac sodium (VOLTAREN) 1 % GEL Apply 2 g topically 4 (four) times daily. 100 g 3  . furosemide (LASIX) 20 MG tablet Take 20 mg by mouth daily.   3  . gabapentin (NEURONTIN) 300 MG capsule Take 300 mg by mouth 2 (two) times daily.     Marland Kitchen glucose blood test strip Use as instructed Dx. Code 250.02 100 each 12  . losartan (COZAAR) 100 MG tablet TAKE 1 TABLET (100 MG TOTAL) BY MOUTH AT BEDTIME. 30 tablet 4  . magnesium hydroxide (MILK OF MAGNESIA) 400 MG/5ML suspension Take 30 mLs by mouth daily as needed for mild constipation.    . metFORMIN (GLUCOPHAGE) 500 MG tablet TAKE 2 TABLETS (1,000 MG TOTAL) BY MOUTH 2 (TWO) TIMES DAILY WITH A MEAL. 120 tablet 1  . mirtazapine (REMERON) 15 MG tablet Take 15 mg by mouth at bedtime as needed (sleep).    . ondansetron (ZOFRAN) 4 MG  tablet Take 1 tablet (4 mg total) by mouth every 8 (eight) hours as needed for nausea or vomiting. 20 tablet 0  . PATADAY 0.2 % SOLN Place 1 drop into both eyes daily as needed. For itchy eyes    . ranitidine (ZANTAC) 150 MG capsule Take 1 capsule (150 mg total) by mouth 2 (two) times daily as needed for heartburn. 60 capsule 3  . rosuvastatin (CRESTOR) 20 MG tablet Take 1 tablet (20 mg total)  by mouth at bedtime. 30 tablet 2  . tamoxifen (NOLVADEX) 20 MG tablet daily.    . traMADol (ULTRAM) 50 MG tablet Take 1 tablet (50 mg total) by mouth every 12 (twelve) hours as needed for severe pain. 30 tablet 0  . [DISCONTINUED] cetirizine (ZYRTEC) 5 MG tablet Take 1 tablet (5 mg total) by mouth daily. Take at night time as it can cause some drowsiness 30 tablet 4   No current facility-administered medications for this visit.    PHYSICAL EXAMINATION: ECOG PERFORMANCE STATUS: 0 - Asymptomatic  Filed Vitals:   12/10/14 1154  BP: 165/58  Pulse: 81  Temp: 99.3 F (37.4 C)  Resp: 18   Filed Weights   12/10/14 1154  Weight: 118 lb 6.4 oz (53.706 kg)    GENERAL:alert, no distress and comfortable SKIN: skin color, texture, turgor are normal, no rashes or significant lesions EYES: normal, Conjunctiva are pink and non-injected, sclera clear OROPHARYNX:no exudate, no erythema and lips, buccal mucosa, and tongue normal  NECK: supple, thyroid normal size, non-tender, without nodularity LYMPH:  no palpable lymphadenopathy in the cervical, axillary or inguinal LUNGS: clear to auscultation and percussion with normal breathing effort HEART: regular rate & rhythm and no murmurs and no lower extremity edema ABDOMEN:abdomen soft, non-tender and normal bowel sounds Musculoskeletal:no cyanosis of digits and no clubbing  NEURO: alert & oriented x 3 with fluent speech, no focal motor/sensory deficits Chest wall examination revealed well-healed mastectomy scar with no abnormalities  LABORATORY DATA:  I have reviewed the data as listed    Component Value Date/Time   NA 143 12/10/2014 1130   NA 141 10/26/2014 1524   K 3.7 12/10/2014 1130   K 4.0 10/26/2014 1524   CL 107 10/26/2014 1524   CL 105 12/11/2012 1349   CO2 24 12/10/2014 1130   CO2 24 10/26/2014 1524   GLUCOSE 141* 12/10/2014 1130   GLUCOSE 118* 10/26/2014 1524   GLUCOSE 218* 12/11/2012 1349   BUN 18.3 12/10/2014 1130   BUN 17  10/26/2014 1524   CREATININE 1.2* 12/10/2014 1130   CREATININE 1.01 10/26/2014 1524   CREATININE 1.18* 09/20/2014 1140   CALCIUM 10.1 12/10/2014 1130   CALCIUM 10.2 10/26/2014 1524   PROT 7.1 12/10/2014 1130   PROT 6.9 09/20/2014 1140   ALBUMIN 3.5 12/10/2014 1130   ALBUMIN 2.9* 09/20/2014 1140   AST 10 12/10/2014 1130   AST 16 09/20/2014 1140   ALT <6 12/10/2014 1130   ALT <5 09/20/2014 1140   ALKPHOS 38* 12/10/2014 1130   ALKPHOS 39 09/20/2014 1140   BILITOT 0.28 12/10/2014 1130   BILITOT 0.3 09/20/2014 1140   GFRNONAA 40* 09/20/2014 1140   GFRAA 46* 09/20/2014 1140    No results found for: SPEP, UPEP  Lab Results  Component Value Date   WBC 3.9 12/10/2014   NEUTROABS 2.3 12/10/2014   HGB 9.1* 12/10/2014   HCT 28.9* 12/10/2014   MCV 70.3* 12/10/2014   PLT 152 12/10/2014      Chemistry  Component Value Date/Time   NA 143 12/10/2014 1130   NA 141 10/26/2014 1524   K 3.7 12/10/2014 1130   K 4.0 10/26/2014 1524   CL 107 10/26/2014 1524   CL 105 12/11/2012 1349   CO2 24 12/10/2014 1130   CO2 24 10/26/2014 1524   BUN 18.3 12/10/2014 1130   BUN 17 10/26/2014 1524   CREATININE 1.2* 12/10/2014 1130   CREATININE 1.01 10/26/2014 1524   CREATININE 1.18* 09/20/2014 1140      Component Value Date/Time   CALCIUM 10.1 12/10/2014 1130   CALCIUM 10.2 10/26/2014 1524   ALKPHOS 38* 12/10/2014 1130   ALKPHOS 39 09/20/2014 1140   AST 10 12/10/2014 1130   AST 16 09/20/2014 1140   ALT <6 12/10/2014 1130   ALT <5 09/20/2014 1140   BILITOT 0.28 12/10/2014 1130   BILITOT 0.3 09/20/2014 1140      ASSESSMENT & PLAN:  Breast cancer, left breast Clinically, she has no recurrence. She'll continue on tamoxifen until January 2017 to complete 5 years with of adjuvant treatment. So far, she tolerated treatment well without any side effects.     Colon cancer She has no evidence of recurrence. Continue history and examination only.   Anemia of chronic illness This is  likely anemia of chronic disease. The patient denies recent history of bleeding such as epistaxis, hematuria or hematochezia. She is asymptomatic from the anemia. We will observe for now.  She does not require transfusion now.     No orders of the defined types were placed in this encounter.   All questions were answered. The patient knows to call the clinic with any problems, questions or concerns. No barriers to learning was detected. I spent 15 minutes counseling the patient face to face. The total time spent in the appointment was 20 minutes and more than 50% was on counseling and review of test results     [facility], [place], MD 12/10/2014 2:27 PM

## 2014-12-10 NOTE — Telephone Encounter (Signed)
Gave avs & calendar for January 2017. °

## 2014-12-14 LAB — SPEP & IFE WITH QIG
ALBUMIN ELP: 53.3 % — AB (ref 55.8–66.1)
Alpha-1-Globulin: 4.8 % (ref 2.9–4.9)
Alpha-2-Globulin: 11.3 % (ref 7.1–11.8)
Beta 2: 6.3 % (ref 3.2–6.5)
Beta Globulin: 5.6 % (ref 4.7–7.2)
Gamma Globulin: 18.7 % (ref 11.1–18.8)
IGA: 403 mg/dL — AB (ref 69–380)
IGM, SERUM: 165 mg/dL (ref 52–322)
IgG (Immunoglobin G), Serum: 1180 mg/dL (ref 690–1700)
Total Protein, Serum Electrophoresis: 6.7 g/dL (ref 6.0–8.3)

## 2014-12-14 LAB — KAPPA/LAMBDA LIGHT CHAINS
KAPPA FREE LGHT CHN: 4.37 mg/dL — AB (ref 0.33–1.94)
KAPPA LAMBDA RATIO: 2 — AB (ref 0.26–1.65)
Lambda Free Lght Chn: 2.18 mg/dL (ref 0.57–2.63)

## 2014-12-16 ENCOUNTER — Other Ambulatory Visit: Payer: Self-pay | Admitting: *Deleted

## 2014-12-16 ENCOUNTER — Other Ambulatory Visit: Payer: Self-pay | Admitting: Family Medicine

## 2014-12-16 NOTE — Telephone Encounter (Signed)
Please let the pt know I refilled her Lasix.  Thanks, [name], MD [facility] Family [facility] Resident  12/16/2014, 3:30 PM

## 2014-12-17 ENCOUNTER — Other Ambulatory Visit: Payer: Self-pay | Admitting: *Deleted

## 2014-12-17 MED ORDER — TAMOXIFEN CITRATE 20 MG PO TABS: 20.0000 mg | ORAL_TABLET | Freq: Every day | ORAL | Status: DC

## 2014-12-17 NOTE — Telephone Encounter (Signed)
Please let the pt know that her tamoxifen was refilled.  Thanks, [name], MD [facility] Family Medicine Resident  12/17/2014, 4:24 PM

## 2014-12-23 DIAGNOSIS — H2512 Age-related nuclear cataract, left eye: Secondary | ICD-10-CM | POA: Diagnosis not present

## 2014-12-23 DIAGNOSIS — H2511 Age-related nuclear cataract, right eye: Secondary | ICD-10-CM | POA: Diagnosis not present

## 2015-01-05 DIAGNOSIS — H2512 Age-related nuclear cataract, left eye: Secondary | ICD-10-CM | POA: Diagnosis not present

## 2015-01-05 DIAGNOSIS — H21562 Pupillary abnormality, left eye: Secondary | ICD-10-CM | POA: Diagnosis not present

## 2015-01-07 ENCOUNTER — Telehealth: Payer: Self-pay | Admitting: Family Medicine

## 2015-01-07 DIAGNOSIS — M546 Pain in thoracic spine: Secondary | ICD-10-CM

## 2015-01-07 NOTE — Telephone Encounter (Signed)
Need refill on her tramadol.  Call when ready for pickup.

## 2015-01-08 MED ORDER — TRAMADOL HCL 50 MG PO TABS: 50.0000 mg | ORAL_TABLET | Freq: Two times a day (BID) | ORAL | Status: DC | PRN

## 2015-01-08 NOTE — Telephone Encounter (Signed)
Please let the patient know the Rx is refilled and at the front desk.  Thanks, [name], MD [facility] Family Medicine Resident  01/08/2015, 7:25 AM

## 2015-01-11 ENCOUNTER — Other Ambulatory Visit: Payer: Self-pay | Admitting: Family Medicine

## 2015-01-18 ENCOUNTER — Encounter: Payer: Self-pay | Admitting: Family Medicine

## 2015-01-18 ENCOUNTER — Ambulatory Visit (INDEPENDENT_AMBULATORY_CARE_PROVIDER_SITE_OTHER): Payer: Medicare Other | Admitting: Family Medicine

## 2015-01-18 VITALS — BP 139/80 | HR 84 | Temp 98.2°F | Ht 59.0 in | Wt 119.8 lb

## 2015-01-18 DIAGNOSIS — R05 Cough: Secondary | ICD-10-CM

## 2015-01-18 DIAGNOSIS — R112 Nausea with vomiting, unspecified: Secondary | ICD-10-CM | POA: Diagnosis not present

## 2015-01-18 DIAGNOSIS — E119 Type 2 diabetes mellitus without complications: Secondary | ICD-10-CM

## 2015-01-18 DIAGNOSIS — R058 Other specified cough: Secondary | ICD-10-CM

## 2015-01-18 LAB — POCT GLYCOSYLATED HEMOGLOBIN (HGB A1C): HEMOGLOBIN A1C: 6.7

## 2015-01-18 MED ORDER — ALBUTEROL SULFATE HFA 108 (90 BASE) MCG/ACT IN AERS: 2.0000 | INHALATION_SPRAY | Freq: Four times a day (QID) | RESPIRATORY_TRACT | Status: DC | PRN

## 2015-01-18 MED ORDER — RANITIDINE HCL 150 MG PO CAPS: ORAL_CAPSULE | ORAL | Status: DC

## 2015-01-18 NOTE — Progress Notes (Signed)
Patient ID: [name], female   DOB: Mar 16, 1924, 81 y.o.   MRN: [id number]      Subjective: CC: cough, diabetes HPI: Patient is a 81 y.o. female presenting to clinic today for concerns about her blood sugar and cough. Concerns today include:  1. Leg weakness x 1 month. This weakness does not occur everyday but she notices it when she's walking more. She notices with when she's at the grocery store. She does not feel like her legs give out or lock on her.  She uses a 2 wheeled roller walker and other times a cane. No history of falls due to weakness. She did have a fall approximately 6 months ago due to turning around to quickly (denies dizziness, lightheadness, palpitations, weakness). Patient feels it is due to old age as she tells me once she rests her legs no longer feel weak.  2. Cough: Stable from presentation on 11/26/14. Cough is worse at night however she does not cough every night. Cough is productive of a white phelgm.Denies fevers, chills, congestion, rhinorrhea, SOB, chest tightness or abdominal pain.  Last used albuterol 6-7 months ago. Was prescribed Benzonatate 100mg  at the last appt for this cough, however the patient didn't get this because insurance wouldn't cover it.    3. Diabetes: Checks blood sugar 3-4 times per day (is supposed to check 2-3x day) High at home: 148 (worried that this number is too high) Low at home:109  Taking medications: metformin 500mg  in AM and 500mg  PM  Side effects: None ROS: denies fever, chills, dizziness, LOC, polyuria, polydipsia, numbness or tingling in extremities or chest pain. Last eye exam: Has appt with ophthalmologist soon  Last foot exam:10/26/14   Social History: smoking status reviewed  ROS: All other systems reviewed and are negative.  Past Medical History Patient Active Problem List   Diagnosis Date Noted  . Right leg pain   . Accelerated hypertension   . Hypertensive emergency 09/19/2014  . Dizziness 09/19/2014  .  Anemia of chronic illness 08/19/2014  . Acute superficial venous thrombosis of right lower extremity 08/19/2014  . Right ankle pain 08/10/2014  . Hypercalcemia 06/09/2014  . Breast cancer, left breast 06/09/2014  . Breast cancer, right breast 06/09/2014  . Edema of left lower extremity 03/30/2014  . PAD (peripheral artery disease) 03/30/2014  . Colon cancer 12/11/2013  . Neck pain 10/22/2013  . Light headed 06/16/2013  . History of fall 05/15/2013  . Right thigh pain 05/15/2013  . Memory deficit 05/15/2013  . Bilateral leg pain 03/07/2013  . Trigger thumb of right hand 03/07/2013  . Lower extremity edema 02/19/2013  . Weakness generalized 01/22/2013  . Stricture and stenosis of esophagus 10/27/2012  . Poor social situation 09/13/2012  . Low back pain 08/07/2012  . Musculoskeletal chest pain 07/10/2012  . Thoracic back pain 05/24/2012  . Productive cough 04/30/2012  . Weight loss 04/03/2012  . Insomnia 09/25/2011  . Tobacco abuse 06/02/2011  . History of breast cancer in female, bilateral.  Mastectomies 10/24/2010. 04/26/2011  . Hiatal hernia 03/22/2011  . Colon polyp 03/22/2011  . Poor circulation 03/22/2011  . Other malaise and fatigue 03/22/2011  . 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 NEUROPATHY 08/25/2009  . ANEMIA-IRON DEFICIENCY 04/29/2007  . HYPERLIPIDEMIA 01/29/2007  . Diabetes type 2, controlled 01/10/2007  . HYPERTENSION, BENIGN SYSTEMIC 01/10/2007  . CORONARY, ARTERIOSCLEROSIS 01/10/2007  . Chronic systolic heart failure 19/50/9326  . Asthma 01/10/2007  . Reflux esophagitis 01/10/2007  . ARTHRITIS 01/10/2007  Medications- reviewed and updated Current Outpatient Prescriptions  Medication Sig Dispense Refill  . acetaminophen (TYLENOL) 500 MG tablet Take 1-2 tablets (500-1,000 mg total) by mouth every 8 (eight) hours as needed. 30 tablet 0  . albuterol (PROVENTIL HFA;VENTOLIN HFA) 108 (90 BASE) MCG/ACT inhaler Inhale 2 puffs into the lungs every 6  (six) hours as needed. For shortness of breath 1 Inhaler 6  . amLODipine (NORVASC) 10 MG tablet 2 (two) times daily.  3  . aspirin (ASPIRIN CHILDRENS) 81 MG chewable tablet Chew 81 mg by mouth daily.      . carvedilol (COREG) 12.5 MG tablet TAKE 1 TABLET (12.5 MG TOTAL) BY MOUTH 2 (TWO) TIMES DAILY WITH A MEAL. 60 tablet 2  . colchicine 0.6 MG tablet Take 0.5 tablets (0.3 mg total) by mouth daily. 30 tablet 0  . cyclobenzaprine (FLEXERIL) 5 MG tablet Take 0.5-1 tablets (2.5-5 mg total) by mouth 3 (three) times daily as needed for muscle spasms. 30 tablet 1  . diclofenac sodium (VOLTAREN) 1 % GEL Apply 2 g topically 4 (four) times daily. 100 g 3  . furosemide (LASIX) 20 MG tablet Take 20 mg by mouth daily.   3  . furosemide (LASIX) 20 MG tablet TAKE 1 TABLET BY MOUTH DAILY 30 tablet 3  . gabapentin (NEURONTIN) 300 MG capsule Take 300 mg by mouth 2 (two) times daily.     Marland Kitchen glucose blood test strip Use as instructed Dx. Code 250.02 100 each 12  . losartan (COZAAR) 100 MG tablet TAKE 1 TABLET (100 MG TOTAL) BY MOUTH AT BEDTIME. 30 tablet 4  . magnesium hydroxide (MILK OF MAGNESIA) 400 MG/5ML suspension Take 30 mLs by mouth daily as needed for mild constipation.    . metFORMIN (GLUCOPHAGE) 500 MG tablet TAKE 2 TABLETS (1,000 MG TOTAL) BY MOUTH 2 (TWO) TIMES DAILY WITH A MEAL. 120 tablet 1  . mirtazapine (REMERON) 15 MG tablet Take 15 mg by mouth at bedtime as needed (sleep).    . ondansetron (ZOFRAN) 4 MG tablet Take 1 tablet (4 mg total) by mouth every 8 (eight) hours as needed for nausea or vomiting. 20 tablet 0  . PATADAY 0.2 % SOLN Place 1 drop into both eyes daily as needed. For itchy eyes    . ranitidine (ZANTAC) 150 MG capsule TAKE ONE CAPSULE BY MOUTH TWICE A DAY AS NEEDED FOR HEARTBURN 60 capsule 3  . rosuvastatin (CRESTOR) 20 MG tablet Take 1 tablet (20 mg total) by mouth at bedtime. 30 tablet 2  . tamoxifen (NOLVADEX) 20 MG tablet Take 1 tablet (20 mg total) by mouth daily. 30 tablet 3  .  traMADol (ULTRAM) 50 MG tablet Take 1 tablet (50 mg total) by mouth every 12 (twelve) hours as needed for severe pain. 30 tablet 0  . [DISCONTINUED] cetirizine (ZYRTEC) 5 MG tablet Take 1 tablet (5 mg total) by mouth daily. Take at night time as it can cause some drowsiness 30 tablet 4   No current facility-administered medications for this visit.    Objective: Office vital signs reviewed. BP 139/80 mmHg  Pulse 84  Temp(Src) 98.2 F (36.8 C) (Oral)  Ht 4\' 11"  (1.499 m)  Wt 119 lb 12.8 oz (54.341 kg)  BMI 24.18 kg/m2   Physical Examination:  General: Awake, alert, thin elderly woman with a rolling walker.  HEENT: Normal. No LAD, nasal turbinates moist, MMM, no erythema Cardio: RRR, no m/r/g noted. Pulm: CTAB, no wheezes, rhonchi or crackles noted. MSK: 5/5 strength in the  upper and lower extremities bilaterally. Slow normal gait with rolling walker.  Skin: dry, intact, no rashes or lesions  Assessment/Plan: Diabetes type 2, controlled Patient is concerned about her CBGs at home. Her fasting CBGs range from 109-148; no hypoglycemic spells. She denies any side effects from metformin. - A1c today 6.7 - continue metformin 500mg  BID - will check creatinine at next appt  - pt to get eye exam in the next few months per her report - f/u in 3 months    Productive cough Patient with continued intermittent productive cough (she did not initially endorse this complaint until her daughter reminded her). No URI symptoms, chest pain, or SOB. Patient afebrile with stable vital signs on exam.  No focal findings on exam concerning for pneumonia vs CHF.  No LE edema concerning for fluid overload.  - refilled albuterol as this appears consistent with asthma (intermittent, worse at night, no other associate symptoms). - if symptoms continue/worsen or she has new symptoms, advised to RTC.  - in the future, if she continues to have symptoms that occur more consistently, consider CXR     Orders  Placed This Encounter  Procedures  . POCT A1C    Meds ordered this encounter  Medications  . ranitidine (ZANTAC) 150 MG capsule    Sig: TAKE ONE CAPSULE BY MOUTH TWICE A DAY AS NEEDED FOR HEARTBURN    Dispense:  60 capsule    Refill:  3  . albuterol (PROVENTIL HFA;VENTOLIN HFA) 108 (90 BASE) MCG/ACT inhaler    Sig: Inhale 2 puffs into the lungs every 6 (six) hours as needed. For shortness of breath    Dispense:  1 Inhaler    Refill:  [facility] PGY-1, [place]

## 2015-01-18 NOTE — Patient Instructions (Signed)
I will check your A1c today, if it is high, we may need to increase your metformin dosage. I refilled your albuterol and Zantac, hopefull this will help with your occasional cough at bedtime. Please follow up with me in 3 months or sooner if necessary

## 2015-01-18 NOTE — Assessment & Plan Note (Signed)
Patient is concerned about her CBGs at home. Her fasting CBGs range from 109-148; no hypoglycemic spells. She denies any side effects from metformin. - A1c today 6.7 - continue metformin 500mg  BID - will check creatinine at next appt  - pt to get eye exam in the next few months per her report - f/u in 3 months

## 2015-01-18 NOTE — Assessment & Plan Note (Signed)
Patient with continued intermittent productive cough (she did not initially endorse this complaint until her daughter reminded her). No URI symptoms, chest pain, or SOB. Patient afebrile with stable vital signs on exam.  No focal findings on exam concerning for pneumonia vs CHF.  No LE edema concerning for fluid overload.  - refilled albuterol as this appears consistent with asthma (intermittent, worse at night, no other associate symptoms). - if symptoms continue/worsen or she has new symptoms, advised to RTC.  - in the future, if she continues to have symptoms that occur more consistently, consider CXR

## 2015-01-25 ENCOUNTER — Observation Stay (HOSPITAL_COMMUNITY)
Admission: EM | Admit: 2015-01-25 | Discharge: 2015-01-28 | Disposition: A | Discharge status: Home Health | Payer: Medicare Other | Attending: Family Medicine | Admitting: Family Medicine

## 2015-01-25 ENCOUNTER — Emergency Department (HOSPITAL_COMMUNITY): Payer: Medicare Other

## 2015-01-25 ENCOUNTER — Encounter (HOSPITAL_COMMUNITY): Payer: Self-pay | Admitting: Emergency Medicine

## 2015-01-25 DIAGNOSIS — D509 Iron deficiency anemia, unspecified: Secondary | ICD-10-CM | POA: Insufficient documentation

## 2015-01-25 DIAGNOSIS — G9340 Encephalopathy, unspecified: Secondary | ICD-10-CM

## 2015-01-25 DIAGNOSIS — I6782 Cerebral ischemia: Secondary | ICD-10-CM | POA: Diagnosis not present

## 2015-01-25 DIAGNOSIS — G319 Degenerative disease of nervous system, unspecified: Secondary | ICD-10-CM | POA: Diagnosis not present

## 2015-01-25 DIAGNOSIS — R2990 Unspecified symptoms and signs involving the nervous system: Secondary | ICD-10-CM

## 2015-01-25 DIAGNOSIS — M1990 Unspecified osteoarthritis, unspecified site: Secondary | ICD-10-CM | POA: Diagnosis not present

## 2015-01-25 DIAGNOSIS — Z96651 Presence of right artificial knee joint: Secondary | ICD-10-CM | POA: Insufficient documentation

## 2015-01-25 DIAGNOSIS — F17200 Nicotine dependence, unspecified, uncomplicated: Secondary | ICD-10-CM | POA: Insufficient documentation

## 2015-01-25 DIAGNOSIS — E785 Hyperlipidemia, unspecified: Secondary | ICD-10-CM | POA: Insufficient documentation

## 2015-01-25 DIAGNOSIS — N390 Urinary tract infection, site not specified: Secondary | ICD-10-CM | POA: Insufficient documentation

## 2015-01-25 DIAGNOSIS — E119 Type 2 diabetes mellitus without complications: Secondary | ICD-10-CM | POA: Insufficient documentation

## 2015-01-25 DIAGNOSIS — I739 Peripheral vascular disease, unspecified: Secondary | ICD-10-CM | POA: Diagnosis not present

## 2015-01-25 DIAGNOSIS — R55 Syncope and collapse: Secondary | ICD-10-CM | POA: Insufficient documentation

## 2015-01-25 DIAGNOSIS — Z85038 Personal history of other malignant neoplasm of large intestine: Secondary | ICD-10-CM | POA: Diagnosis not present

## 2015-01-25 DIAGNOSIS — Z853 Personal history of malignant neoplasm of breast: Secondary | ICD-10-CM | POA: Insufficient documentation

## 2015-01-25 DIAGNOSIS — Z66 Do not resuscitate: Secondary | ICD-10-CM | POA: Diagnosis not present

## 2015-01-25 DIAGNOSIS — R4182 Altered mental status, unspecified: Secondary | ICD-10-CM | POA: Diagnosis not present

## 2015-01-25 DIAGNOSIS — R600 Localized edema: Secondary | ICD-10-CM

## 2015-01-25 DIAGNOSIS — I2510 Atherosclerotic heart disease of native coronary artery without angina pectoris: Secondary | ICD-10-CM | POA: Insufficient documentation

## 2015-01-25 DIAGNOSIS — J45909 Unspecified asthma, uncomplicated: Secondary | ICD-10-CM | POA: Diagnosis not present

## 2015-01-25 DIAGNOSIS — K219 Gastro-esophageal reflux disease without esophagitis: Secondary | ICD-10-CM | POA: Insufficient documentation

## 2015-01-25 DIAGNOSIS — C50912 Malignant neoplasm of unspecified site of left female breast: Secondary | ICD-10-CM | POA: Diagnosis present

## 2015-01-25 DIAGNOSIS — I639 Cerebral infarction, unspecified: Secondary | ICD-10-CM

## 2015-01-25 DIAGNOSIS — I10 Essential (primary) hypertension: Secondary | ICD-10-CM | POA: Diagnosis not present

## 2015-01-25 DIAGNOSIS — R9431 Abnormal electrocardiogram [ECG] [EKG]: Secondary | ICD-10-CM | POA: Diagnosis not present

## 2015-01-25 DIAGNOSIS — I161 Hypertensive emergency: Secondary | ICD-10-CM | POA: Diagnosis present

## 2015-01-25 DIAGNOSIS — I5022 Chronic systolic (congestive) heart failure: Secondary | ICD-10-CM | POA: Diagnosis not present

## 2015-01-25 DIAGNOSIS — R29810 Facial weakness: Secondary | ICD-10-CM | POA: Diagnosis not present

## 2015-01-25 DIAGNOSIS — I252 Old myocardial infarction: Secondary | ICD-10-CM | POA: Diagnosis not present

## 2015-01-25 DIAGNOSIS — E1140 Type 2 diabetes mellitus with diabetic neuropathy, unspecified: Secondary | ICD-10-CM

## 2015-01-25 DIAGNOSIS — R059 Cough, unspecified: Secondary | ICD-10-CM

## 2015-01-25 DIAGNOSIS — G450 Vertebro-basilar artery syndrome: Secondary | ICD-10-CM | POA: Diagnosis present

## 2015-01-25 DIAGNOSIS — R531 Weakness: Secondary | ICD-10-CM | POA: Diagnosis not present

## 2015-01-25 DIAGNOSIS — C50911 Malignant neoplasm of unspecified site of right female breast: Secondary | ICD-10-CM | POA: Diagnosis present

## 2015-01-25 DIAGNOSIS — R05 Cough: Secondary | ICD-10-CM

## 2015-01-25 DIAGNOSIS — R5381 Other malaise: Secondary | ICD-10-CM

## 2015-01-25 DIAGNOSIS — I6789 Other cerebrovascular disease: Secondary | ICD-10-CM | POA: Diagnosis not present

## 2015-01-25 DIAGNOSIS — I674 Hypertensive encephalopathy: Secondary | ICD-10-CM | POA: Diagnosis not present

## 2015-01-25 DIAGNOSIS — R410 Disorientation, unspecified: Secondary | ICD-10-CM | POA: Diagnosis not present

## 2015-01-25 DIAGNOSIS — R569 Unspecified convulsions: Secondary | ICD-10-CM | POA: Diagnosis present

## 2015-01-25 LAB — GLUCOSE, CAPILLARY: Glucose-Capillary: 90 mg/dL (ref 70–99)

## 2015-01-25 LAB — COMPREHENSIVE METABOLIC PANEL
ALBUMIN: 3.6 g/dL (ref 3.5–5.2)
ALT: 9 U/L (ref 0–35)
ANION GAP: 10 (ref 5–15)
AST: 15 U/L (ref 0–37)
Alkaline Phosphatase: 32 U/L — ABNORMAL LOW (ref 39–117)
BILIRUBIN TOTAL: 0.3 mg/dL (ref 0.3–1.2)
BUN: 15 mg/dL (ref 6–23)
CALCIUM: 10.2 mg/dL (ref 8.4–10.5)
CO2: 22 mmol/L (ref 19–32)
CREATININE: 1.05 mg/dL (ref 0.50–1.10)
Chloride: 106 mmol/L (ref 96–112)
GFR calc non Af Amer: 46 mL/min — ABNORMAL LOW (ref >90)
GFR, EST AFRICAN AMERICAN: 53 mL/min — AB (ref >90)
Glucose, Bld: 109 mg/dL — ABNORMAL HIGH (ref 70–99)
Potassium: 3.4 mmol/L — ABNORMAL LOW (ref 3.5–5.1)
Sodium: 138 mmol/L (ref 135–145)
Total Protein: 7 g/dL (ref 6.0–8.3)

## 2015-01-25 LAB — CBC
HCT: 29.4 % — ABNORMAL LOW (ref 36.0–46.0)
Hemoglobin: 9.3 g/dL — ABNORMAL LOW (ref 12.0–15.0)
MCH: 22.4 pg — AB (ref 26.0–34.0)
MCHC: 31.6 g/dL (ref 30.0–36.0)
MCV: 70.7 fL — ABNORMAL LOW (ref 78.0–100.0)
PLATELETS: 163 10*3/uL (ref 150–400)
RBC: 4.16 MIL/uL (ref 3.87–5.11)
RDW: 16.1 % — ABNORMAL HIGH (ref 11.5–15.5)
WBC: 4.3 10*3/uL (ref 4.0–10.5)

## 2015-01-25 LAB — URINALYSIS, ROUTINE W REFLEX MICROSCOPIC
BILIRUBIN URINE: NEGATIVE
Glucose, UA: NEGATIVE mg/dL
HGB URINE DIPSTICK: NEGATIVE
Ketones, ur: NEGATIVE mg/dL
NITRITE: NEGATIVE
PROTEIN: NEGATIVE mg/dL
Specific Gravity, Urine: 1.009 (ref 1.005–1.030)
UROBILINOGEN UA: 1 mg/dL (ref 0.0–1.0)
pH: 7.5 (ref 5.0–8.0)

## 2015-01-25 LAB — RAPID URINE DRUG SCREEN, HOSP PERFORMED
Amphetamines: NOT DETECTED
BENZODIAZEPINES: NOT DETECTED
Barbiturates: NOT DETECTED
Cocaine: NOT DETECTED
OPIATES: NOT DETECTED
Tetrahydrocannabinol: NOT DETECTED

## 2015-01-25 LAB — DIFFERENTIAL
Basophils Absolute: 0 10*3/uL (ref 0.0–0.1)
Basophils Relative: 1 % (ref 0–1)
Eosinophils Absolute: 0.1 10*3/uL (ref 0.0–0.7)
Eosinophils Relative: 2 % (ref 0–5)
Lymphocytes Relative: 35 % (ref 12–46)
Lymphs Abs: 1.5 10*3/uL (ref 0.7–4.0)
MONO ABS: 0.4 10*3/uL (ref 0.1–1.0)
MONOS PCT: 8 % (ref 3–12)
NEUTROS ABS: 2.4 10*3/uL (ref 1.7–7.7)
Neutrophils Relative %: 55 % (ref 43–77)

## 2015-01-25 LAB — I-STAT CHEM 8, ED
BUN: 16 mg/dL (ref 6–23)
Calcium, Ion: 1.33 mmol/L — ABNORMAL HIGH (ref 1.13–1.30)
Chloride: 106 mmol/L (ref 96–112)
Creatinine, Ser: 1 mg/dL (ref 0.50–1.10)
GLUCOSE: 109 mg/dL — AB (ref 70–99)
HEMATOCRIT: 33 % — AB (ref 36.0–46.0)
HEMOGLOBIN: 11.2 g/dL — AB (ref 12.0–15.0)
POTASSIUM: 3.5 mmol/L (ref 3.5–5.1)
Sodium: 142 mmol/L (ref 135–145)
TCO2: 20 mmol/L (ref 0–100)

## 2015-01-25 LAB — URINE MICROSCOPIC-ADD ON

## 2015-01-25 LAB — I-STAT TROPONIN, ED: TROPONIN I, POC: 0.01 ng/mL (ref 0.00–0.08)

## 2015-01-25 LAB — APTT: aPTT: 28 seconds (ref 24–37)

## 2015-01-25 LAB — PROTIME-INR
INR: 1.1 (ref 0.00–1.49)
PROTHROMBIN TIME: 14.3 s (ref 11.6–15.2)

## 2015-01-25 LAB — ETHANOL: Alcohol, Ethyl (B): <5 mg/dL (ref 0–9)

## 2015-01-25 MED ORDER — ASPIRIN 81 MG PO CHEW
81.0000 mg | CHEWABLE_TABLET | Freq: Every day | ORAL | Status: DC
  Administered 2015-01-26 – 2015-01-28 (×3): 81 mg via ORAL
  Filled 2015-01-25 (×4): qty 1

## 2015-01-25 MED ORDER — GABAPENTIN 300 MG PO CAPS
300.0000 mg | ORAL_CAPSULE | Freq: Two times a day (BID) | ORAL | Status: DC
  Administered 2015-01-26 – 2015-01-28 (×6): 300 mg via ORAL
  Filled 2015-01-25 (×7): qty 1

## 2015-01-25 MED ORDER — METFORMIN HCL 500 MG PO TABS
1000.0000 mg | ORAL_TABLET | Freq: Two times a day (BID) | ORAL | Status: DC
  Administered 2015-01-26 – 2015-01-28 (×5): 1000 mg via ORAL
  Filled 2015-01-25 (×5): qty 2

## 2015-01-25 MED ORDER — DICLOFENAC SODIUM 1 % TD GEL
2.0000 g | Freq: Four times a day (QID) | TRANSDERMAL | Status: DC
  Administered 2015-01-26 – 2015-01-28 (×7): 2 g via TOPICAL
  Filled 2015-01-25 (×2): qty 100

## 2015-01-25 MED ORDER — AMLODIPINE BESYLATE 10 MG PO TABS
10.0000 mg | ORAL_TABLET | Freq: Two times a day (BID) | ORAL | Status: DC
  Administered 2015-01-26: 10 mg via ORAL
  Filled 2015-01-25: qty 1

## 2015-01-25 MED ORDER — HEPARIN SODIUM (PORCINE) 5000 UNIT/ML IJ SOLN
5000.0000 [IU] | Freq: Three times a day (TID) | INTRAMUSCULAR | Status: DC
  Filled 2015-01-25: qty 1

## 2015-01-25 MED ORDER — HEPARIN SODIUM (PORCINE) 5000 UNIT/ML IJ SOLN
5000.0000 [IU] | Freq: Three times a day (TID) | INTRAMUSCULAR | Status: DC
  Administered 2015-01-26 – 2015-01-28 (×8): 5000 [IU] via SUBCUTANEOUS
  Filled 2015-01-25 (×7): qty 1

## 2015-01-25 MED ORDER — SODIUM CHLORIDE 0.9 % IJ SOLN: 3.0000 mL | Freq: Two times a day (BID) | INTRAMUSCULAR | Status: DC

## 2015-01-25 MED ORDER — MIRTAZAPINE 15 MG PO TABS: 15.0000 mg | ORAL_TABLET | Freq: Every evening | ORAL | Status: DC | PRN

## 2015-01-25 MED ORDER — ALBUTEROL SULFATE (2.5 MG/3ML) 0.083% IN NEBU: 2.5000 mg | INHALATION_SOLUTION | Freq: Four times a day (QID) | RESPIRATORY_TRACT | Status: DC | PRN

## 2015-01-25 MED ORDER — ROSUVASTATIN CALCIUM 20 MG PO TABS
20.0000 mg | ORAL_TABLET | Freq: Every day | ORAL | Status: DC
  Administered 2015-01-26 – 2015-01-27 (×3): 20 mg via ORAL
  Filled 2015-01-25 (×4): qty 1

## 2015-01-25 MED ORDER — FUROSEMIDE 20 MG PO TABS
20.0000 mg | ORAL_TABLET | Freq: Every day | ORAL | Status: DC
  Administered 2015-01-26 – 2015-01-27 (×2): 20 mg via ORAL
  Filled 2015-01-25 (×3): qty 1

## 2015-01-25 MED ORDER — SODIUM CHLORIDE 0.9 % IJ SOLN
3.0000 mL | Freq: Two times a day (BID) | INTRAMUSCULAR | Status: DC
  Administered 2015-01-26 – 2015-01-28 (×5): 3 mL via INTRAVENOUS

## 2015-01-25 MED ORDER — FAMOTIDINE 20 MG PO TABS: 20.0000 mg | ORAL_TABLET | Freq: Two times a day (BID) | ORAL | Status: DC | PRN

## 2015-01-25 MED ORDER — LABETALOL HCL 5 MG/ML IV SOLN
10.0000 mg | Freq: Once | INTRAVENOUS | Status: AC
  Administered 2015-01-25: 10 mg via INTRAVENOUS
  Filled 2015-01-25: qty 4

## 2015-01-25 MED ORDER — INSULIN ASPART 100 UNIT/ML ~~LOC~~ SOLN
0.0000 [IU] | Freq: Three times a day (TID) | SUBCUTANEOUS | Status: DC
  Administered 2015-01-26 – 2015-01-28 (×3): 1 [IU] via SUBCUTANEOUS

## 2015-01-25 MED ORDER — LABETALOL HCL 5 MG/ML IV SOLN
10.0000 mg | INTRAVENOUS | Status: DC | PRN
  Administered 2015-01-25: 10 mg via INTRAVENOUS
  Filled 2015-01-25: qty 4

## 2015-01-25 MED ORDER — COLCHICINE 0.6 MG PO TABS
0.3000 mg | ORAL_TABLET | Freq: Every day | ORAL | Status: DC
  Administered 2015-01-26 – 2015-01-28 (×3): 0.3 mg via ORAL
  Filled 2015-01-25 (×4): qty 1

## 2015-01-25 MED ORDER — MAGNESIUM HYDROXIDE 400 MG/5ML PO SUSP: 30.0000 mL | Freq: Every day | ORAL | Status: DC | PRN

## 2015-01-25 MED ORDER — LOSARTAN POTASSIUM 50 MG PO TABS
100.0000 mg | ORAL_TABLET | Freq: Every day | ORAL | Status: DC
  Administered 2015-01-26 – 2015-01-27 (×3): 100 mg via ORAL
  Filled 2015-01-25 (×4): qty 2

## 2015-01-25 MED ORDER — CARVEDILOL 12.5 MG PO TABS
12.5000 mg | ORAL_TABLET | Freq: Two times a day (BID) | ORAL | Status: DC
  Administered 2015-01-26 – 2015-01-27 (×3): 12.5 mg via ORAL
  Filled 2015-01-25 (×3): qty 1

## 2015-01-25 MED ORDER — TAMOXIFEN CITRATE 10 MG PO TABS
20.0000 mg | ORAL_TABLET | Freq: Every day | ORAL | Status: DC
  Administered 2015-01-26 – 2015-01-28 (×3): 20 mg via ORAL
  Filled 2015-01-25 (×3): qty 2

## 2015-01-25 NOTE — ED Notes (Signed)
Pt arrived from home with family by [facility] with c/o Code Stroke. Pt last seen normal was 1600 at 1605 family noticed that pt was sitting in chair slumped to the left and not responding. Pt went to store with family today and had no problems, normally walks and alert and oriented. EMS arrived and noted pt slumped to left in chair, drooling and staring unresponsive. BP-220/90 HR-72 O2sat-100%ra. CBG 102. Pt enroute became responsive to pain and upon arrival to ED became more responsive but has some altered mental status.

## 2015-01-25 NOTE — ED Notes (Signed)
Received report from Hope, RN. 

## 2015-01-25 NOTE — ED Notes (Signed)
Code stroke canceled by Dr Janann Colonel

## 2015-01-25 NOTE — ED Notes (Signed)
Unable to obtain IV, MD aware and will obtain ultrasound IV

## 2015-01-25 NOTE — ED Provider Notes (Signed)
CSN: [id number]     Arrival date & time 01/25/15  1650 History   First MD Initiated Contact with Patient 01/25/15 1658     Chief Complaint  Patient presents with  . Code Stroke     (Consider location/radiation/quality/duration/timing/severity/associated sxs/prior Treatment) Patient is a 80 y.o. female presenting with altered mental status.  Altered Mental Status Presenting symptoms: confusion   Severity:  Severe Most recent episode:  Today Episode history:  Continuous Duration:  1 hour Timing:  Constant Progression:  Improving Chronicity:  New Context: not head injury     Past Medical History  Diagnosis Date  . Breast cancer 10/2010    Invasive Ductal Carcinoma, s/p bilateral mastectomy, now on Tamoxifen. Followed by Dr Jamse Arn.   . Diabetes mellitus   . Hypertension   . Hyperlipidemia   . Anemia     Iron def anemia  . CAD (coronary artery disease)   . CHF (congestive heart failure)     Systolic   . History of colon cancer   . Neuropathy   . Allergy   . GERD (gastroesophageal reflux disease)   . Hiatal hernia   . Diverticulosis   . Asthma   . Arthritis   . H/O: GI bleed   . Breast cancer 2011    s/p Bilateral masectomy  . Shortness of breath   . Pneumonia 03/2012  . Carotid artery aneurysm 08/2010    right ICA, 5 x 41mm  . DDD (degenerative disc disease), cervical   . Colon cancer   . Colon cancer 12/11/2013  . Myocardial infarction   . Hypercalcemia 06/09/2014   Past Surgical History  Procedure Laterality Date  . Breast masectomy  10/2010    Bilateral masectomy by Dr Lucia Gaskins s/p invasive ductal carcinoma.  . Cardiac  catherization  2007    Severe 3-vessel disease.  EF 20-25%.  . Esophagogastroduodenoscopy  2001    Esophageal Tear  . Total knee arthroplasty  2001  . Esophagogastroduodenoscopy  10/27/2012    Procedure: ESOPHAGOGASTRODUODENOSCOPY (EGD);  Surgeon: Irene Shipper, MD;  Location: [facility] ENDOSCOPY;  Service: Endoscopy;  Laterality: N/A;  pat  . Breast  surgery    . Joint replacement Right 2001    Knee   Family History  Problem Relation Age of Onset  . Sickle cell anemia Other   . Heart disease Mother   . Heart disease Father   . Cancer Daughter 55    breast ca  . Hypertension Daughter   . Cancer Daughter 87    breast ca  . Hypertension Daughter   . Heart disease Son     Poor circulation-Left Leg  . Diabetes Daughter   . Hypertension Daughter   . Hyperlipidemia Daughter     Poor circulation- Toe amputation   History  Substance Use Topics  . Smoking status: Never Smoker   . Smokeless tobacco: Current User    Types: Snuff  . Alcohol Use: No   OB History    No data available     Review of Systems  Psychiatric/Behavioral: Positive for confusion.  All other systems reviewed and are negative.     Allergies  Peanuts and Lisinopril  Home Medications   Prior to Admission medications   Medication Sig Start Date End Date Taking? Authorizing Provider  acetaminophen (TYLENOL) 500 MG tablet Take 1-2 tablets (500-1,000 mg total) by mouth every 8 (eight) hours as needed. 07/10/14  Yes Mariel Aloe, MD  albuterol (PROVENTIL HFA;VENTOLIN HFA) 108 (90 BASE) MCG/ACT  inhaler Inhale 2 puffs into the lungs every 6 (six) hours as needed. For shortness of breath 01/18/15  Yes [name], MD  amLODipine (NORVASC) 10 MG tablet 2 (two) times daily. 09/12/14  Yes Historical Provider, MD  aspirin (ASPIRIN CHILDRENS) 81 MG chewable tablet Chew 81 mg by mouth daily.     Yes Historical Provider, MD  carvedilol (COREG) 12.5 MG tablet TAKE 1 TABLET (12.5 MG TOTAL) BY MOUTH 2 (TWO) TIMES DAILY WITH A MEAL. 12/07/14  Yes [name], MD  diclofenac sodium (VOLTAREN) 1 % GEL Apply 2 g topically 4 (four) times daily. 08/10/14  Yes Timmothy Euler, MD  furosemide (LASIX) 20 MG tablet TAKE 1 TABLET BY MOUTH DAILY 12/16/14  Yes [name], MD  gabapentin (NEURONTIN) 300 MG capsule Take 300 mg by mouth 2 (two) times daily.    Yes Historical  Provider, MD  glucose blood test strip Use as instructed Dx. Code 250.02 09/28/14  Yes [name], MD  losartan (COZAAR) 100 MG tablet TAKE 1 TABLET (100 MG TOTAL) BY MOUTH AT BEDTIME. 10/19/14  Yes [name], MD  magnesium hydroxide (MILK OF MAGNESIA) 400 MG/5ML suspension Take 30 mLs by mouth daily as needed for mild constipation.   Yes Historical Provider, MD  metFORMIN (GLUCOPHAGE) 500 MG tablet TAKE 2 TABLETS (1,000 MG TOTAL) BY MOUTH 2 (TWO) TIMES DAILY WITH A MEAL. 10/30/14  Yes [name], MD  mirtazapine (REMERON) 15 MG tablet Take 15 mg by mouth at bedtime as needed (sleep).   Yes Historical Provider, MD  ondansetron (ZOFRAN) 4 MG tablet Take 1 tablet (4 mg total) by mouth every 8 (eight) hours as needed for nausea or vomiting. 08/19/14  Yes Olam Idler, MD  ranitidine (ZANTAC) 150 MG capsule TAKE ONE CAPSULE BY MOUTH TWICE A DAY AS NEEDED FOR HEARTBURN 01/18/15  Yes [name], MD  rosuvastatin (CRESTOR) 20 MG tablet Take 1 tablet (20 mg total) by mouth at bedtime. 09/03/14  Yes [name], MD  tamoxifen (NOLVADEX) 20 MG tablet Take 1 tablet (20 mg total) by mouth daily. 12/17/14  Yes [name], MD  traMADol (ULTRAM) 50 MG tablet Take 1 tablet (50 mg total) by mouth every 12 (twelve) hours as needed for severe pain. 01/08/15  Yes [name], MD  colchicine 0.6 MG tablet Take 0.5 tablets (0.3 mg total) by mouth daily. 09/23/14   Virginia Crews, MD  cyclobenzaprine (FLEXERIL) 5 MG tablet Take 0.5-1 tablets (2.5-5 mg total) by mouth 3 (three) times daily as needed for muscle spasms. 11/26/14   Olin Hauser, DO  furosemide (LASIX) 20 MG tablet Take 20 mg by mouth daily.  09/12/14   Historical Provider, MD   BP 201/72 mmHg  Pulse 71  Temp(Src) 97.7 F (36.5 C) (Oral)  Resp 20  SpO2 100% Physical Exam  Constitutional: She appears well-developed and well-nourished.  HENT:  Head: Normocephalic and atraumatic.  Right Ear: External ear  normal.  Left Ear: External ear normal.  Eyes: Conjunctivae and EOM are normal. Pupils are equal, round, and reactive to light.  Neck: Normal range of motion. Neck supple.  Cardiovascular: Normal rate, regular rhythm, normal heart sounds and intact distal pulses.   Pulmonary/Chest: Effort normal and breath sounds normal.  Abdominal: Soft. Bowel sounds are normal. There is no tenderness.  Musculoskeletal: Normal range of motion.  Neurological: She is alert. GCS eye subscore is 4. GCS verbal subscore is 4. GCS  motor subscore is 6.  MAE  Skin: Skin is warm and dry.  Vitals reviewed.   ED Course  Procedures (including critical care time) Labs Review Labs Reviewed  CBC - Abnormal; Notable for the following:    Hemoglobin 9.3 (*)    HCT 29.4 (*)    MCV 70.7 (*)    MCH 22.4 (*)    RDW 16.1 (*)    All other components within normal limits  COMPREHENSIVE METABOLIC PANEL - Abnormal; Notable for the following:    Potassium 3.4 (*)    Glucose, Bld 109 (*)    Alkaline Phosphatase 32 (*)    GFR calc non Af Amer 46 (*)    GFR calc Af Amer 53 (*)    All other components within normal limits  I-STAT CHEM 8, ED - Abnormal; Notable for the following:    Glucose, Bld 109 (*)    Calcium, Ion 1.33 (*)    Hemoglobin 11.2 (*)    HCT 33.0 (*)    All other components within normal limits  ETHANOL  PROTIME-INR  APTT  DIFFERENTIAL  URINE RAPID DRUG SCREEN (HOSP PERFORMED)  URINALYSIS, ROUTINE W REFLEX MICROSCOPIC  I-STAT TROPOININ, ED  I-STAT TROPOININ, ED    Imaging Review Ct Head Wo Contrast  01/25/2015   CLINICAL DATA:  Code stroke.  Confusion.  Bilateral weakness.  EXAM: CT HEAD WITHOUT CONTRAST  TECHNIQUE: Contiguous axial images were obtained from the base of the skull through the vertex without intravenous contrast.  COMPARISON:  09/19/2014  FINDINGS: There is atrophy and chronic small vessel disease changes. No acute intracranial abnormality. Specifically, no hemorrhage, hydrocephalus,  mass lesion, acute infarction, or significant intracranial injury. No acute calvarial abnormality.  IMPRESSION: No acute intracranial abnormality.  Atrophy, chronic microvascular disease.  Critical Value/emergent results were called by telephone at the time of interpretation on 01/25/2015 at 5:21 pm to Dr. Debby Freiberg , who verbally acknowledged these results.   Electronically Signed   By: Rolm Baptise M.D.   On: 01/25/2015 17:22     EKG Interpretation   Date/Time:  Monday January 25 2015 17:19:34 EDT Ventricular Rate:  70 PR Interval:  192 QRS Duration: 71 QT Interval:  435 QTC Calculation: 469 R Axis:   9 Text Interpretation:  Sinus or ectopic atrial rhythm Abnormal R-wave  progression, early transition Left ventricular hypertrophy Baseline wander  in lead(s) II aVR No significant change since last tracing Confirmed by  Debby Freiberg [phone]) on 01/25/2015 5:53:07 PM      Emergency Ultrasound Study:   Angiocath insertion Performed by: Debby Freiberg  Consent: Verbal consent obtained. Risks and benefits: risks, benefits and alternatives were discussed Immediately prior to procedure the correct patient, procedure, equipment, support staff and site/side marked as needed.  Indication: difficult IV access Preparation: Patient was prepped and draped in the usual sterile fashion. Vein Location: R brachial vein was visualized during assessment for potential access sites and was found to be patent/ easily compressed with linear ultrasound.  The needle was visualized with real-time ultrasound and guided into the vein. Gauge: 20   Image saved and stored.  Normal blood return.  Patient tolerance: Patient tolerated the procedure well with no immediate complications.      MDM   Final diagnoses:  Stroke-like symptoms    81 y.o. female with pertinent PMH of DM, CAD, CHF presents with altered mental status, initially unresponsive per family, but with gradually improving mental status  prior to arrival.  Code stroke called.  Code stroke cleared after negative head CT.  Appears likely hypertensive encephalopathy.  Pt does endorse LUE symptoms prior to onset of symptoms, however is disoriented to time at time of exam.  Consulted medicine for admission  I have reviewed all laboratory and imaging studies if ordered as above  1. Stroke-like symptoms         Debby Freiberg, MD 01/25/15 1820

## 2015-01-25 NOTE — H&P (Signed)
[facility] Admission History and Physical Service Pager: [phone]  [name] Medical record number: [id number] Date of birth: [birth year] Age: 81 y.o. Gender: female  Primary Care Provider: [name], MD Consultants: Neurology Code Status: DNR per discussion on admission  Chief Complaint: AMS  Assessment and Plan: ERIANA SULIMAN is a 80 y.o. female presenting with acute onset altered mental status. PMH is significant for HTN, CAD, PAD, DM, hyperlipidemia, tobacco abuse, asthma, GERD, hx of colon and breast cancer, and h/o MI.  # HTN/hypertensive emergency/hypertensive encephalopathy: Patient with hypertensive emergency with encephalopathy vs syncopal episode. BP elevated to 220s SBP in ED. Symptoms largely resolved on presentation to hospital. Head CT and MRI negative for acute change, EKG unchanged, troponin negative. UDS unremarkable. Other possible DDx PRES vs seizure/post ictal state. No clear description of focal neuro deficit to raise concern for TIA. Did have h/o CT angio neck 09/2013 with diffuse atherosclerotic disease. No change in med/PO/symptoms prior to this occurring today. Unlikely orthostatic. Also consider structural issue (sx AS or other issue predisposing to arrhythmia). - Continue home coreg, norvasc, losartan, and lasix  - Monitor BP with gradual lowering, goal <220/120 for today - Continue IV Labetolol PRN for BP control - Neurochecks q2 - f/u neuro recs including EEG and TSH - consider further stroke workup (MRA neck) given posterior circulation atherosclerotic disease - rediscuss in AM.  - Echocardiogram, BNP, telemetry, AM EKG to eval for cardiogenic syncope; consider cardiology consult if abnormal. If not hospitalized long, can consider loop rcdr at d/c. - Orthostatics. - f/u with family in AM to further elicit more history - Could also begin eval for 2ndary causes (medications: ?tamoxifen?; low-stable GFR  53.)  #CHF: No evidence of exacerbation. Minimal volume overload appreciated with trace edema, some JVD, and crackles on bilateral lung bases. Last echo in March 2015 showed normal EF and grade 1 diastolic dysfunction. No shortness of breath. - 2 view CXR - continue meds as above - Monitor volume status  - strict I/Os  #Cough: productive, non-discolored phlegm produced. Bibasilar crackles, L>R, afebrile. -CXR ordered -Tessalon prn for cough  #UTI: Asymptomatic. Many bacteria on UA. No nitrites and small leukocytes. -will not treat at this time -continue to monitor; treat if symtpomatic  #Anemia: Hbg 9.3. MCV 70. H/o iron deficiency not currently on iron. Stable, baseline 9-10.  -trend CBC  -consider starting iron  #DM: A1c 6.7 this month - Continue home metformin - sensitive SSI  #CAD/PAD: - continue home ASA and crestor  FEN/GI: heart healthy/carb mod diet, SLIV, home H2 blocker Prophylaxis: SQ heparin  Disposition: Admit to FPTS for observation on telemetry, under Dr. Andria Frames.   History of Present Illness: [name] is a 81 y.o. female presenting with acute onset altered mental status. PMH is significant for HTN, CAD, PAD, DM, hyperlipidemia, tobacco abuse, asthma, GERD, hx of colon and breast cancer, and h/o MI.  All history according to patient. Patient states that she was doing well this morning until she came home from grocery store. She states that she was trying to fix herself something to eat and realized she had to go to the bathroom. When she started walking to the bathroom she felt herself falling and stayed in place, leaning against her cane. Called her son to the room who helped her sit down in a chair. Patient never did fall (according to her) or sustain any trauma but endorses possible syncope. Prior to event she states  she was having left hip pain. After being placed in chair patient does not recall anything after that. She says that everything went "blank."  Unsure of how she came to the hospital. Denies any CP, headache, visual or auditory disturbances, weakness, changes in gait, or SOB. Says she did feel dizzy (like the room spinning) at that time. Says she has taken all of her BP medications today. Children help put out her medications and she states taking them regularly. She also reports eating and drinking normally up to this time. She also reports that blood sugars have been relatively consistent in the 140s fasting and this did not feel like hypoglycemia which she has felt in the past. Denies any increase in leg swelling, orthopnea or PND. Reports 1.5 weeks cough with white phlegm but no fever or chest pain.  Per discussion with dtr on phone, pt was sitting in chair, stood, suddenly felt weak like knees were going to give out. Son helped her down into chair, where she sat and became unresponsive, slumping to side, eyes rolling into head, lasting until in EMS about 38m later. Denies shaking movements. Otherwise, had been normal self until this episode.  Per EMS, patient was sitting with family when she slumped to the left and became unresponsive. Upon EMS arrival noted to be staring blankly, non-verbal, not moving extremities. BP 220/90 at that time. Upon arrival to ED patient was confused but verbal, moving all extremities. BP in ED noted to be 220/90. Code stroke called.    Review Of Systems: Per HPI. Otherwise 12 point review of systems was performed and was unremarkable.  Patient Active Problem List   Diagnosis Date Noted  . Stroke-like episode 01/25/2015  . Right leg pain   . Accelerated hypertension   . Hypertensive emergency 09/19/2014  . Dizziness 09/19/2014  . Anemia of chronic illness 08/19/2014  . Acute superficial venous thrombosis of right lower extremity 08/19/2014  . Right ankle pain 08/10/2014  . Hypercalcemia 06/09/2014  . Breast cancer, left breast 06/09/2014  . Breast cancer, right breast 06/09/2014  . Edema of left lower  extremity 03/30/2014  . PAD (peripheral artery disease) 03/30/2014  . Colon cancer 12/11/2013  . Neck pain 10/22/2013  . Light headed 06/16/2013  . History of fall 05/15/2013  . Right thigh pain 05/15/2013  . Memory deficit 05/15/2013  . Bilateral leg pain 03/07/2013  . Trigger thumb of right hand 03/07/2013  . Lower extremity edema 02/19/2013  . Weakness generalized 01/22/2013  . Stricture and stenosis of esophagus 10/27/2012  . Poor social situation 09/13/2012  . Low back pain 08/07/2012  . Musculoskeletal chest pain 07/10/2012  . Thoracic back pain 05/24/2012  . Productive cough 04/30/2012  . Weight loss 04/03/2012  . Insomnia 09/25/2011  . Tobacco abuse 06/02/2011  . History of breast cancer in female, bilateral.  Mastectomies 10/24/2010. 04/26/2011  . Hiatal hernia 03/22/2011  . Colon polyp 03/22/2011  . Poor circulation 03/22/2011  . Other malaise and fatigue 03/22/2011  . 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 NEUROPATHY 08/25/2009  . ANEMIA-IRON DEFICIENCY 04/29/2007  . HLD (hyperlipidemia) 01/29/2007  . Diabetes type 2, controlled 01/10/2007  . HYPERTENSION, BENIGN SYSTEMIC 01/10/2007  . Coronary atherosclerosis 01/10/2007  . Chronic systolic heart failure 54/62/7035  . Asthma 01/10/2007  . Reflux esophagitis 01/10/2007  . ARTHRITIS 01/10/2007   Past Medical History: Past Medical History  Diagnosis Date  . Breast cancer 10/2010    Invasive Ductal Carcinoma, s/p bilateral mastectomy, now on Tamoxifen. Followed  by Dr Jamse Arn.   . Diabetes mellitus   . Hypertension   . Hyperlipidemia   . Anemia     Iron def anemia  . CAD (coronary artery disease)   . CHF (congestive heart failure)     Systolic   . History of colon cancer   . Neuropathy   . Allergy   . GERD (gastroesophageal reflux disease)   . Hiatal hernia   . Diverticulosis   . Asthma   . Arthritis   . H/O: GI bleed   . Breast cancer 2011    s/p Bilateral masectomy  . Shortness of breath    . Pneumonia 03/2012  . Carotid artery aneurysm 08/2010    right ICA, 5 x 84mm  . DDD (degenerative disc disease), cervical   . Colon cancer   . Colon cancer 12/11/2013  . Myocardial infarction   . Hypercalcemia 06/09/2014   Past Surgical History: Past Surgical History  Procedure Laterality Date  . Breast masectomy  10/2010    Bilateral masectomy by Dr Lucia Gaskins s/p invasive ductal carcinoma.  . Cardiac  catherization  2007    Severe 3-vessel disease.  EF 20-25%.  . Esophagogastroduodenoscopy  2001    Esophageal Tear  . Total knee arthroplasty  2001  . Esophagogastroduodenoscopy  10/27/2012    Procedure: ESOPHAGOGASTRODUODENOSCOPY (EGD);  Surgeon: Irene Shipper, MD;  [facility];  Service: Endoscopy;  Laterality: N/A;  pat  . Breast surgery    . Joint replacement Right 2001    Knee   Social History: History  Substance Use Topics  . Smoking status: Never Smoker   . Smokeless tobacco: Current User    Types: Snuff  . Alcohol Use: No   Additional social history: Son and daughter stays at home with her. Has large family. They help her with her daily medication administration. Please also refer to relevant sections of EMR.  Family History: Family History  Problem Relation Age of Onset  . Sickle cell anemia Other   . Heart disease Mother   . Heart disease Father   . Cancer Daughter 53    breast ca  . Hypertension Daughter   . Cancer Daughter 48    breast ca  . Hypertension Daughter   . Heart disease Son     Poor circulation-Left Leg  . Diabetes Daughter   . Hypertension Daughter   . Hyperlipidemia Daughter     Poor circulation- Toe amputation   Allergies and Medications: Allergies  Allergen Reactions  . Peanuts [Nuts] Swelling  . Lisinopril Cough   No current facility-administered medications on file prior to encounter.   Current Outpatient Prescriptions on File Prior to Encounter  Medication Sig Dispense Refill  . albuterol (PROVENTIL HFA;VENTOLIN HFA)  108 (90 BASE) MCG/ACT inhaler Inhale 2 puffs into the lungs every 6 (six) hours as needed. For shortness of breath 1 Inhaler 6  . amLODipine (NORVASC) 10 MG tablet 2 (two) times daily.  3  . aspirin (ASPIRIN CHILDRENS) 81 MG chewable tablet Chew 81 mg by mouth daily.      . carvedilol (COREG) 12.5 MG tablet TAKE 1 TABLET (12.5 MG TOTAL) BY MOUTH 2 (TWO) TIMES DAILY WITH A MEAL. 60 tablet 2  . diclofenac sodium (VOLTAREN) 1 % GEL Apply 2 g topically 4 (four) times daily. 100 g 3  . furosemide (LASIX) 20 MG tablet TAKE 1 TABLET BY MOUTH DAILY 30 tablet 3  . gabapentin (NEURONTIN) 300 MG capsule Take 300 mg by mouth 2 (  two) times daily.     Marland Kitchen glucose blood test strip Use as instructed Dx. Code 250.02 100 each 12  . losartan (COZAAR) 100 MG tablet TAKE 1 TABLET (100 MG TOTAL) BY MOUTH AT BEDTIME. 30 tablet 4  . magnesium hydroxide (MILK OF MAGNESIA) 400 MG/5ML suspension Take 30 mLs by mouth daily as needed for mild constipation.    . metFORMIN (GLUCOPHAGE) 500 MG tablet TAKE 2 TABLETS (1,000 MG TOTAL) BY MOUTH 2 (TWO) TIMES DAILY WITH A MEAL. 120 tablet 1  . mirtazapine (REMERON) 15 MG tablet Take 15 mg by mouth at bedtime as needed (sleep).    . ondansetron (ZOFRAN) 4 MG tablet Take 1 tablet (4 mg total) by mouth every 8 (eight) hours as needed for nausea or vomiting. 20 tablet 0  . ranitidine (ZANTAC) 150 MG capsule TAKE ONE CAPSULE BY MOUTH TWICE A DAY AS NEEDED FOR HEARTBURN 60 capsule 3  . rosuvastatin (CRESTOR) 20 MG tablet Take 1 tablet (20 mg total) by mouth at bedtime. 30 tablet 2  . tamoxifen (NOLVADEX) 20 MG tablet Take 1 tablet (20 mg total) by mouth daily. 30 tablet 3  . traMADol (ULTRAM) 50 MG tablet Take 1 tablet (50 mg total) by mouth every 12 (twelve) hours as needed for severe pain. 30 tablet 0  . colchicine 0.6 MG tablet Take 0.5 tablets (0.3 mg total) by mouth daily. 30 tablet 0  . cyclobenzaprine (FLEXERIL) 5 MG tablet Take 0.5-1 tablets (2.5-5 mg total) by mouth 3 (three) times  daily as needed for muscle spasms. 30 tablet 1  . furosemide (LASIX) 20 MG tablet Take 20 mg by mouth daily.   3  . [DISCONTINUED] cetirizine (ZYRTEC) 5 MG tablet Take 1 tablet (5 mg total) by mouth daily. Take at night time as it can cause some drowsiness 30 tablet 4    Objective: BP 208/72 mmHg  Pulse 67  Temp(Src) 97.7 F (36.5 C) (Oral)  Resp 21  SpO2 100% Exam: General: frail elderly woman, lying in bed, NAD, pleasant HEENT: wet mucous membranes, dark tongue, NCAT, PERRL, EOMI, arcus senilis present bilaterally Cardiovascular: RRR, no m/r/g, distal pulses intact, 7 cm JVD bilaterally Respiratory: normal WOB, crackles appreciated at bilateral lung bases, L>R. No wheezes. Abdomen: soft, NTND, +BS Extremities: WWP, trace LE edema bilaterally. LLE with limited ROM at hip and knee joints with no tenderness greater trochanter.  Skin: no rashes, thin  Neuro: alert and oriented, no focal deficits, CN 2-12 tested intact. 5/5 strength bilateral UE and bilateral grip; 4+/5 left hip flexion, 5/5/ right hip flexion, sensation intact. Normal speech  Labs and Imaging: Results for orders placed or performed during the hospital encounter of 01/25/15 (from the past 24 hour(s))  Ethanol     Status: None   Collection Time: 01/25/15  4:56 PM  Result Value Ref Range   Alcohol, Ethyl (B) <5 0 - 9 mg/dL  Protime-INR     Status: None   Collection Time: 01/25/15  4:56 PM  Result Value Ref Range   Prothrombin Time 14.3 11.6 - 15.2 seconds   INR 1.10 0.00 - 1.49  APTT     Status: None   Collection Time: 01/25/15  4:56 PM  Result Value Ref Range   aPTT 28 24 - 37 seconds  CBC     Status: Abnormal   Collection Time: 01/25/15  4:56 PM  Result Value Ref Range   WBC 4.3 4.0 - 10.5 K/uL   RBC 4.16 3.87 - 5.11 MIL/uL  Hemoglobin 9.3 (L) 12.0 - 15.0 g/dL   HCT 29.4 (L) 36.0 - 46.0 %   MCV 70.7 (L) 78.0 - 100.0 fL   MCH 22.4 (L) 26.0 - 34.0 pg   MCHC 31.6 30.0 - 36.0 g/dL   RDW 16.1 (H) 11.5 - 15.5 %    Platelets 163 150 - 400 K/uL  Differential     Status: None   Collection Time: 01/25/15  4:56 PM  Result Value Ref Range   Neutrophils Relative % 55 43 - 77 %   Neutro Abs 2.4 1.7 - 7.7 K/uL   Lymphocytes Relative 35 12 - 46 %   Lymphs Abs 1.5 0.7 - 4.0 K/uL   Monocytes Relative 8 3 - 12 %   Monocytes Absolute 0.4 0.1 - 1.0 K/uL   Eosinophils Relative 2 0 - 5 %   Eosinophils Absolute 0.1 0.0 - 0.7 K/uL   Basophils Relative 1 0 - 1 %   Basophils Absolute 0.0 0.0 - 0.1 K/uL  Comprehensive metabolic panel     Status: Abnormal   Collection Time: 01/25/15  4:56 PM  Result Value Ref Range   Sodium 138 135 - 145 mmol/L   Potassium 3.4 (L) 3.5 - 5.1 mmol/L   Chloride 106 96 - 112 mmol/L   CO2 22 19 - 32 mmol/L   Glucose, Bld 109 (H) 70 - 99 mg/dL   BUN 15 6 - 23 mg/dL   Creatinine, Ser 1.05 0.50 - 1.10 mg/dL   Calcium 10.2 8.4 - 10.5 mg/dL   Total Protein 7.0 6.0 - 8.3 g/dL   Albumin 3.6 3.5 - 5.2 g/dL   AST 15 0 - 37 U/L   ALT 9 0 - 35 U/L   Alkaline Phosphatase 32 (L) 39 - 117 U/L   Total Bilirubin 0.3 0.3 - 1.2 mg/dL   GFR calc non Af Amer 46 (L) >90 mL/min   GFR calc Af Amer 53 (L) >90 mL/min   Anion gap 10 5 - 15  I-Stat Troponin, ED (not at [facility])     Status: None   Collection Time: 01/25/15  5:00 PM  Result Value Ref Range   Troponin i, poc 0.01 0.00 - 0.08 ng/mL   Comment 3          I-Stat Chem 8, ED     Status: Abnormal   Collection Time: 01/25/15  5:01 PM  Result Value Ref Range   Sodium 142 135 - 145 mmol/L   Potassium 3.5 3.5 - 5.1 mmol/L   Chloride 106 96 - 112 mmol/L   BUN 16 6 - 23 mg/dL   Creatinine, Ser 1.00 0.50 - 1.10 mg/dL   Glucose, Bld 109 (H) 70 - 99 mg/dL   Calcium, Ion 1.33 (H) 1.13 - 1.30 mmol/L   TCO2 20 0 - 100 mmol/L   Hemoglobin 11.2 (L) 12.0 - 15.0 g/dL   HCT 33.0 (L) 36.0 - 46.0 %  Urine Drug Screen     Status: None   Collection Time: 01/25/15  6:42 PM  Result Value Ref Range   Opiates NONE DETECTED NONE DETECTED   Cocaine NONE DETECTED  NONE DETECTED   Benzodiazepines NONE DETECTED NONE DETECTED   Amphetamines NONE DETECTED NONE DETECTED   Tetrahydrocannabinol NONE DETECTED NONE DETECTED   Barbiturates NONE DETECTED NONE DETECTED  Urinalysis, Routine w reflex microscopic     Status: Abnormal   Collection Time: 01/25/15  6:42 PM  Result Value Ref Range   Color, Urine YELLOW YELLOW  APPearance CLEAR CLEAR   Specific Gravity, Urine 1.009 1.005 - 1.030   pH 7.5 5.0 - 8.0   Glucose, UA NEGATIVE NEGATIVE mg/dL   Hgb urine dipstick NEGATIVE NEGATIVE   Bilirubin Urine NEGATIVE NEGATIVE   Ketones, ur NEGATIVE NEGATIVE mg/dL   Protein, ur NEGATIVE NEGATIVE mg/dL   Urobilinogen, UA 1.0 0.0 - 1.0 mg/dL   Nitrite NEGATIVE NEGATIVE   Leukocytes, UA SMALL (A) NEGATIVE  Urine microscopic-add on     Status: Abnormal   Collection Time: 01/25/15  6:42 PM  Result Value Ref Range   Squamous Epithelial / LPF RARE RARE   WBC, UA 3-6 <3 WBC/hpf   Bacteria, UA MANY (A) RARE   Ct Head Wo Contrast  01/25/2015   CLINICAL DATA:  Code stroke.  Confusion.  Bilateral weakness.  EXAM: CT HEAD WITHOUT CONTRAST  TECHNIQUE: Contiguous axial images were obtained from the base of the skull through the vertex without intravenous contrast.  COMPARISON:  09/19/2014  FINDINGS: There is atrophy and chronic small vessel disease changes. No acute intracranial abnormality. Specifically, no hemorrhage, hydrocephalus, mass lesion, acute infarction, or significant intracranial injury. No acute calvarial abnormality.  IMPRESSION: No acute intracranial abnormality.  Atrophy, chronic microvascular disease.  Critical Value/emergent results were called by telephone at the time of interpretation on 01/25/2015 at 5:21 pm to Dr. Debby Freiberg , who verbally acknowledged these results.   Electronically Signed   By: Rolm Baptise M.D.   On: 01/25/2015 17:22   Mr Brain Wo Contrast  01/25/2015   CLINICAL DATA:  Code stroke. Last seen normal 1600 hours today. Altered mental  status. History of breast cancer. Initial encounter.  EXAM: MRI HEAD WITHOUT CONTRAST  TECHNIQUE: Multiplanar, multiecho pulse sequences of the brain and surrounding structures were obtained without intravenous contrast.  COMPARISON:  CT head 01/25/2015 earlier today.  FINDINGS: No evidence for acute infarction, hemorrhage, mass lesion, hydrocephalus, or extra-axial fluid. Generalized atrophy. Moderately advanced chronic microvascular ischemic change involving the periventricular and subcortical white matter, with lesser changes in the pons and cerebellum. Remote lacunar infarct in the RIGHT lentiform nucleus and LEFT thalamus. Flow voids are maintained with diminutive RIGHT vertebral. Tiny foci of susceptibility in the LEFT frontal white matter likely representing hypertensive microbleeds or post treatment effect. No acute osseous findings. No midline abnormality. BILATERAL cataract extraction. No sinus air-fluid level or significant mastoid fluid.  IMPRESSION: Chronic changes as described.  No intracranial mass lesion or evidence for acute infarction.  Good general agreement with prior CT.   Electronically Signed   By: Rolla Flatten M.D.   On: 01/25/2015 18:26    Hilton Sinclair, MD 01/25/2015, 8:34 PM PGY-1, [place] [facility] pager: [phone], text pages welcome   I seen and examined the patient with Dr. Gerarda Fraction and agree with her assessment and plan with my changes in blue.  Hilton Sinclair, MD PGY-3, [place]

## 2015-01-25 NOTE — ED Notes (Signed)
Pt in MRI with RN and MD

## 2015-01-25 NOTE — Consult Note (Signed)
Stroke Consult    Chief Complaint: altered mental status  HPI: [name] is an 81 y.o. female hx of HTN, DM, CAD presenting with acute onset altered mental status and left facial droop. Per EMS, patient was sitting with family when she slumped to the left and became unresponsive. Upon EMS arrival noted to be staring blankly, non-verbal, not moving extremities. BP 220/90 at that time. Upon arrival to ED patient was confused but verbal, moving all extremities. BP in ED noted to be 220/90.   CT head imaging reviewed, shows no acute process.   Date last known well: 01/25/2015 Time last known well: 1600 tPA Given: no, symptoms improving, not consistent with ischemic infarct  Past Medical History  Diagnosis Date  . Breast cancer 10/2010    Invasive Ductal Carcinoma, s/p bilateral mastectomy, now on Tamoxifen. Followed by Dr Jamse Arn.   . Diabetes mellitus   . Hypertension   . Hyperlipidemia   . Anemia     Iron def anemia  . CAD (coronary artery disease)   . CHF (congestive heart failure)     Systolic   . History of colon cancer   . Neuropathy   . Allergy   . GERD (gastroesophageal reflux disease)   . Hiatal hernia   . Diverticulosis   . Asthma   . Arthritis   . H/O: GI bleed   . Breast cancer 2011    s/p Bilateral masectomy  . Shortness of breath   . Pneumonia 03/2012  . Carotid artery aneurysm 08/2010    right ICA, 5 x 61mm  . DDD (degenerative disc disease), cervical   . Colon cancer   . Colon cancer 12/11/2013  . Myocardial infarction   . Hypercalcemia 06/09/2014    Past Surgical History  Procedure Laterality Date  . Breast masectomy  10/2010    Bilateral masectomy by Dr Lucia Gaskins s/p invasive ductal carcinoma.  . Cardiac  catherization  2007    Severe 3-vessel disease.  EF 20-25%.  . Esophagogastroduodenoscopy  2001    Esophageal Tear  . Total knee arthroplasty  2001  . Esophagogastroduodenoscopy  10/27/2012    Procedure: ESOPHAGOGASTRODUODENOSCOPY (EGD);  Surgeon:  Irene Shipper, MD;  Location: [facility] ENDOSCOPY;  Service: Endoscopy;  Laterality: N/A;  pat  . Breast surgery    . Joint replacement Right 2001    Knee    Family History  Problem Relation Age of Onset  . Sickle cell anemia Other   . Heart disease Mother   . Heart disease Father   . Cancer Daughter 9    breast ca  . Hypertension Daughter   . Cancer Daughter 14    breast ca  . Hypertension Daughter   . Heart disease Son     Poor circulation-Left Leg  . Diabetes Daughter   . Hypertension Daughter   . Hyperlipidemia Daughter     Poor circulation- Toe amputation   Social History:  reports that she has never smoked. Her smokeless tobacco use includes Snuff. She reports that she does not drink alcohol or use illicit drugs.  Allergies:  Allergies  Allergen Reactions  . Peanuts [Nuts] Swelling  . Lisinopril Cough     (Not in a hospital admission)  ROS: Out of a complete 14 system review, the patient complains of only the following symptoms, and all other reviewed systems are negative. +Leg pain and weakness  Physical Examination: Filed Vitals:   01/25/15 1718  BP: 201/72  Pulse: 71  Temp: 97.7 F (  36.5 C)  Resp: 20   Physical Exam  Constitutional: He appears well-developed and well-nourished.  Psych: Affect appropriate to situation Eyes: No scleral injection HENT: No OP obstrucion Head: Normocephalic.  Cardiovascular: Normal rate and regular rhythm.  Respiratory: Effort normal and breath sounds normal.  GI: Soft. Bowel sounds are normal. No distension. There is no tenderness.  Skin: WDI  Neurologic Examination: Mental Status: Alert, oriented to name only, intermittently following commands, no aphasia noted Cranial Nerves: II: unable to visualize discs, visual fields grossly normal, pupils equal, round, reactive to light  III,IV, VI: ptosis not present, extra-ocular motions intact bilaterally V,VII: smile symmetric, facial light touch sensation normal  bilaterally VIII: hearing normal bilaterally IX,X: gag reflex present XI: trapezius strength/neck flexion strength normal bilaterally XII: tongue strength normal  Motor: Unable to formally test due to mental status but appears to move all extremities symmetrically and against light resistance.  Tone and bulk:normal tone throughout; no atrophy noted Sensory: withdrawals symmetrically against noxious stimuli in all extremities Deep Tendon Reflexes: 2+ and symmetric throughout Plantars: Right: downgoing   Left: downgoing Cerebellar: Unable to assess due to mental status Gait: deferred  Laboratory Studies:   Basic Metabolic Panel:  Recent Labs Lab 01/25/15 1701  NA 142  K 3.5  CL 106  GLUCOSE 109*  BUN 16  CREATININE 1.00    Liver Function Tests: No results for input(s): AST, ALT, ALKPHOS, BILITOT, PROT, ALBUMIN in the last 168 hours. No results for input(s): LIPASE, AMYLASE in the last 168 hours. No results for input(s): AMMONIA in the last 168 hours.  CBC:  Recent Labs Lab 01/25/15 1656 01/25/15 1701  WBC 4.3  --   NEUTROABS 2.4  --   HGB 9.3* 11.2*  HCT 29.4* 33.0*  MCV 70.7*  --   PLT 163  --     Cardiac Enzymes: No results for input(s): CKTOTAL, CKMB, CKMBINDEX, TROPONINI in the last 168 hours.  BNP: Invalid input(s): POCBNP  CBG: No results for input(s): GLUCAP in the last 168 hours.  Microbiology: Results for orders placed or performed in visit on 11/26/14  Urine culture     Status: None   Collection Time: 11/26/14  5:08 PM  Result Value Ref Range Status   Culture ESCHERICHIA COLI  Final   Colony Count >=100,000 COLONIES/ML  Final   Organism ID, Bacteria ESCHERICHIA COLI  Final      Susceptibility   Escherichia coli -  (no method available)    AMPICILLIN >=32 Resistant     AMOX/CLAVULANIC >=32 Resistant     AMPICILLIN/SULBACTAM 16 Intermediate     PIP/TAZO <=4 Sensitive     IMIPENEM <=0.25 Sensitive     CEFAZOLIN 8 Sensitive     CEFTRIAXONE  <=1 Sensitive     CEFTAZIDIME <=1 Sensitive     CEFEPIME <=1 Sensitive     GENTAMICIN <=1 Sensitive     TOBRAMYCIN <=1 Sensitive     CIPROFLOXACIN <=0.25 Sensitive     LEVOFLOXACIN <=0.12 Sensitive     NITROFURANTOIN <=16 Sensitive     TRIMETH/SULFA <=20 Sensitive     Coagulation Studies:  Recent Labs  01/25/15 1656  LABPROT 14.3  INR 1.10    Urinalysis: No results for input(s): COLORURINE, LABSPEC, PHURINE, GLUCOSEU, HGBUR, BILIRUBINUR, KETONESUR, PROTEINUR, UROBILINOGEN, NITRITE, LEUKOCYTESUR in the last 168 hours.  Invalid input(s): APPERANCEUR  Lipid Panel:     Component Value Date/Time   CHOL 96 07/10/2012 0828   TRIG 92 07/10/2012 0828   HDL 31*  07/10/2012 0828   CHOLHDL 3.1 07/10/2012 0828   VLDL 18 07/10/2012 0828   LDLCALC 47 07/10/2012 0828    HgbA1C:  Lab Results  Component Value Date   HGBA1C 6.7 01/18/2015    Urine Drug Screen:  No results found for: LABOPIA, COCAINSCRNUR, LABBENZ, AMPHETMU, THCU, LABBARB  Alcohol Level: No results for input(s): ETH in the last 168 hours.  Other results:  Imaging: Ct Head Wo Contrast  01/25/2015   CLINICAL DATA:  Code stroke.  Confusion.  Bilateral weakness.  EXAM: CT HEAD WITHOUT CONTRAST  TECHNIQUE: Contiguous axial images were obtained from the base of the skull through the vertex without intravenous contrast.  COMPARISON:  09/19/2014  FINDINGS: There is atrophy and chronic small vessel disease changes. No acute intracranial abnormality. Specifically, no hemorrhage, hydrocephalus, mass lesion, acute infarction, or significant intracranial injury. No acute calvarial abnormality.  IMPRESSION: No acute intracranial abnormality.  Atrophy, chronic microvascular disease.  Critical Value/emergent results were called by telephone at the time of interpretation on 01/25/2015 at 5:21 pm to Dr. Debby Freiberg , who verbally acknowledged these results.   Electronically Signed   By: Rolm Baptise M.D.   On: 01/25/2015 17:22     Assessment: 81 y.o. female hx of hypertension, CAD, DM presenting with acute onset of altered mental status and question of left facial droop. At arrival in ED noted to be encephalopathic with an otherwise unremarkable neurological exam. Taken for stat DWI MRI which was negative for acute stroke. Differential includes hypertensive encephalopathy/PRES vs TIA vs seizure/post ictal state.  -gradual lowering of blood pressure -EEG -check UA, UDS, ammonia, TSH -will continue to follow   Jim Like, DO [facility] [phone]  If 7pm- 7am, please page neurology on call as listed in [place]. 01/25/2015, 5:39 PM

## 2015-01-25 NOTE — Progress Notes (Signed)
Received via stretcher from ED, family at bedside.  Tele applied, alert oriented, denies pain. Clothes at bedside,  Pt / family oriented to room, safety precautions & plan of care.

## 2015-01-26 ENCOUNTER — Observation Stay (HOSPITAL_COMMUNITY): Payer: Medicare Other

## 2015-01-26 DIAGNOSIS — R2990 Unspecified symptoms and signs involving the nervous system: Secondary | ICD-10-CM | POA: Diagnosis not present

## 2015-01-26 DIAGNOSIS — R05 Cough: Secondary | ICD-10-CM | POA: Diagnosis not present

## 2015-01-26 DIAGNOSIS — I10 Essential (primary) hypertension: Secondary | ICD-10-CM | POA: Diagnosis not present

## 2015-01-26 DIAGNOSIS — Z853 Personal history of malignant neoplasm of breast: Secondary | ICD-10-CM | POA: Diagnosis not present

## 2015-01-26 DIAGNOSIS — R55 Syncope and collapse: Secondary | ICD-10-CM | POA: Diagnosis not present

## 2015-01-26 DIAGNOSIS — R569 Unspecified convulsions: Secondary | ICD-10-CM | POA: Diagnosis present

## 2015-01-26 DIAGNOSIS — G450 Vertebro-basilar artery syndrome: Secondary | ICD-10-CM | POA: Diagnosis not present

## 2015-01-26 DIAGNOSIS — R4182 Altered mental status, unspecified: Secondary | ICD-10-CM | POA: Diagnosis not present

## 2015-01-26 DIAGNOSIS — E119 Type 2 diabetes mellitus without complications: Secondary | ICD-10-CM | POA: Diagnosis not present

## 2015-01-26 DIAGNOSIS — I2510 Atherosclerotic heart disease of native coronary artery without angina pectoris: Secondary | ICD-10-CM | POA: Diagnosis not present

## 2015-01-26 DIAGNOSIS — I739 Peripheral vascular disease, unspecified: Secondary | ICD-10-CM | POA: Diagnosis not present

## 2015-01-26 DIAGNOSIS — I674 Hypertensive encephalopathy: Secondary | ICD-10-CM | POA: Diagnosis not present

## 2015-01-26 LAB — CBC
HCT: 29.3 % — ABNORMAL LOW (ref 36.0–46.0)
Hemoglobin: 9.3 g/dL — ABNORMAL LOW (ref 12.0–15.0)
MCH: 22.4 pg — ABNORMAL LOW (ref 26.0–34.0)
MCHC: 31.7 g/dL (ref 30.0–36.0)
MCV: 70.6 fL — ABNORMAL LOW (ref 78.0–100.0)
Platelets: 137 10*3/uL — ABNORMAL LOW (ref 150–400)
RBC: 4.15 MIL/uL (ref 3.87–5.11)
RDW: 15.8 % — AB (ref 11.5–15.5)
WBC: 3.6 10*3/uL — AB (ref 4.0–10.5)

## 2015-01-26 LAB — BASIC METABOLIC PANEL
Anion gap: 6 (ref 5–15)
BUN: 11 mg/dL (ref 6–23)
CALCIUM: 9.8 mg/dL (ref 8.4–10.5)
CO2: 28 mmol/L (ref 19–32)
Chloride: 107 mmol/L (ref 96–112)
Creatinine, Ser: 0.94 mg/dL (ref 0.50–1.10)
GFR calc Af Amer: 61 mL/min — ABNORMAL LOW (ref >90)
GFR calc non Af Amer: 52 mL/min — ABNORMAL LOW (ref >90)
Glucose, Bld: 97 mg/dL (ref 70–99)
Potassium: 3.3 mmol/L — ABNORMAL LOW (ref 3.5–5.1)
SODIUM: 141 mmol/L (ref 135–145)

## 2015-01-26 LAB — GLUCOSE, CAPILLARY
GLUCOSE-CAPILLARY: 111 mg/dL — AB (ref 70–99)
GLUCOSE-CAPILLARY: 116 mg/dL — AB (ref 70–99)
GLUCOSE-CAPILLARY: 133 mg/dL — AB (ref 70–99)
GLUCOSE-CAPILLARY: 114 mg/dL — AB (ref 70–99)

## 2015-01-26 LAB — TSH: TSH: 2.493 u[IU]/mL (ref 0.350–4.500)

## 2015-01-26 LAB — BRAIN NATRIURETIC PEPTIDE: B Natriuretic Peptide: 179.2 pg/mL — ABNORMAL HIGH (ref 0.0–100.0)

## 2015-01-26 LAB — AMMONIA: AMMONIA: 36 umol/L — AB (ref 11–32)

## 2015-01-26 MED ORDER — BENZONATATE 100 MG PO CAPS
100.0000 mg | ORAL_CAPSULE | Freq: Three times a day (TID) | ORAL | Status: DC | PRN
  Administered 2015-01-26 – 2015-01-27 (×3): 100 mg via ORAL
  Filled 2015-01-26 (×3): qty 1

## 2015-01-26 MED ORDER — AMLODIPINE BESYLATE 10 MG PO TABS
10.0000 mg | ORAL_TABLET | Freq: Every day | ORAL | Status: DC
  Administered 2015-01-26: 10 mg via ORAL
  Filled 2015-01-26: qty 1

## 2015-01-26 MED ORDER — FERROUS SULFATE 325 (65 FE) MG PO TABS
325.0000 mg | ORAL_TABLET | Freq: Two times a day (BID) | ORAL | Status: DC
  Administered 2015-01-26 – 2015-01-28 (×4): 325 mg via ORAL
  Filled 2015-01-26 (×4): qty 1

## 2015-01-26 MED ORDER — ACETAMINOPHEN 325 MG PO TABS
650.0000 mg | ORAL_TABLET | Freq: Four times a day (QID) | ORAL | Status: DC | PRN
  Administered 2015-01-26 – 2015-01-28 (×3): 650 mg via ORAL
  Filled 2015-01-26 (×3): qty 2

## 2015-01-26 MED ORDER — CLOPIDOGREL BISULFATE 75 MG PO TABS
75.0000 mg | ORAL_TABLET | Freq: Every day | ORAL | Status: DC
  Administered 2015-01-27 – 2015-01-28 (×2): 75 mg via ORAL
  Filled 2015-01-26 (×3): qty 1

## 2015-01-26 MED ORDER — POTASSIUM CHLORIDE CRYS ER 20 MEQ PO TBCR
40.0000 meq | EXTENDED_RELEASE_TABLET | Freq: Once | ORAL | Status: AC
  Administered 2015-01-26: 40 meq via ORAL
  Filled 2015-01-26: qty 2

## 2015-01-26 NOTE — Progress Notes (Signed)
Subjective: No overnight events. Mental status improved.   EEG completed, results pending.  Objective: Current vital signs: BP 144/56 mmHg  Pulse 73  Temp(Src) 99 F (37.2 C) (Oral)  Resp 18  Ht 4\' 11"  (1.499 m)  Wt 57.607 kg (127 lb)  BMI 25.64 kg/m2  SpO2 100% Vital signs in last 24 hours: Temp:  [97.7 F (36.5 C)-99 F (37.2 C)] 99 F (37.2 C) (03/15 1045) Pulse Rate:  [67-80] 73 (03/15 1045) Resp:  [14-21] 18 (03/15 1045) BP: (132-216)/(50-79) 144/56 mmHg (03/15 1045) SpO2:  [97 %-100 %] 100 % (03/15 1045) Weight:  [57.607 kg (127 lb)] 57.607 kg (127 lb) (03/15 0500)  Intake/Output from previous day:   Intake/Output this shift:   Nutritional status: Diet heart healthy/carb modified  Neurologic Exam: Mental Status: Alert, oriented,, intermittently following commands, no aphasia noted Cranial Nerves: II:  visual fields grossly normal, pupils equal, round reactive to light III,IV, VI: ptosis not present, extra-ocular motions intact bilaterally V,VII: smile symmetric, facial light touch sensation normal bilaterally Motor: Moves all extremities symmetrically and against light resistance  Lab Results: Basic Metabolic Panel:  Recent Labs Lab 01/25/15 1656 01/25/15 1701 01/26/15 0610  NA 138 142 141  K 3.4* 3.5 3.3*  CL 106 106 107  CO2 22  --  28  GLUCOSE 109* 109* 97  BUN 15 16 11   CREATININE 1.05 1.00 0.94  CALCIUM 10.2  --  9.8    Liver Function Tests:  Recent Labs Lab 01/25/15 1656  AST 15  ALT 9  ALKPHOS 32*  BILITOT 0.3  PROT 7.0  ALBUMIN 3.6   No results for input(s): LIPASE, AMYLASE in the last 168 hours.  Recent Labs Lab 01/26/15 0014  AMMONIA 36*    CBC:  Recent Labs Lab 01/25/15 1656 01/25/15 1701 01/26/15 0610  WBC 4.3  --  3.6*  NEUTROABS 2.4  --   --   HGB 9.3* 11.2* 9.3*  HCT 29.4* 33.0* 29.3*  MCV 70.7*  --  70.6*  PLT 163  --  137*    Cardiac Enzymes: No results for input(s): CKTOTAL, CKMB, CKMBINDEX,  TROPONINI in the last 168 hours.  Lipid Panel: No results for input(s): CHOL, TRIG, HDL, CHOLHDL, VLDL, LDLCALC in the last 168 hours.  CBG:  Recent Labs Lab 01/25/15 2314 01/26/15 0703  GLUCAP 90 111*    Microbiology: Results for orders placed or performed in visit on 11/26/14  Urine culture     Status: None   Collection Time: 11/26/14  5:08 PM  Result Value Ref Range Status   Culture ESCHERICHIA COLI  Final   Colony Count >=100,000 COLONIES/ML  Final   Organism ID, Bacteria ESCHERICHIA COLI  Final      Susceptibility   Escherichia coli -  (no method available)    AMPICILLIN >=32 Resistant     AMOX/CLAVULANIC >=32 Resistant     AMPICILLIN/SULBACTAM 16 Intermediate     PIP/TAZO <=4 Sensitive     IMIPENEM <=0.25 Sensitive     CEFAZOLIN 8 Sensitive     CEFTRIAXONE <=1 Sensitive     CEFTAZIDIME <=1 Sensitive     CEFEPIME <=1 Sensitive     GENTAMICIN <=1 Sensitive     TOBRAMYCIN <=1 Sensitive     CIPROFLOXACIN <=0.25 Sensitive     LEVOFLOXACIN <=0.12 Sensitive     NITROFURANTOIN <=16 Sensitive     TRIMETH/SULFA <=20 Sensitive     Coagulation Studies:  Recent Labs  01/25/15 1656  LABPROT 14.3  INR  1.10    Imaging: X-ray Chest Pa And Lateral  01/26/2015   CLINICAL DATA:  Cough and right neck pain  EXAM: CHEST  2 VIEW  COMPARISON:  09/21/2014  FINDINGS: Generous heart size without interval change. There is mild aortic tortuosity which is also stable. The hila are negative. There is no edema, consolidation, effusion, or pneumothorax.  IMPRESSION: No active cardiopulmonary disease.   Electronically Signed   By: Monte Fantasia M.D.   On: 01/26/2015 10:52   Ct Head Wo Contrast  01/25/2015   CLINICAL DATA:  Code stroke.  Confusion.  Bilateral weakness.  EXAM: CT HEAD WITHOUT CONTRAST  TECHNIQUE: Contiguous axial images were obtained from the base of the skull through the vertex without intravenous contrast.  COMPARISON:  09/19/2014  FINDINGS: There is atrophy and chronic  small vessel disease changes. No acute intracranial abnormality. Specifically, no hemorrhage, hydrocephalus, mass lesion, acute infarction, or significant intracranial injury. No acute calvarial abnormality.  IMPRESSION: No acute intracranial abnormality.  Atrophy, chronic microvascular disease.  Critical Value/emergent results were called by telephone at the time of interpretation on 01/25/2015 at 5:21 pm to Dr. Debby Freiberg , who verbally acknowledged these results.   Electronically Signed   By: Rolm Baptise M.D.   On: 01/25/2015 17:22   Mr Brain Wo Contrast  01/25/2015   CLINICAL DATA:  Code stroke. Last seen normal 1600 hours today. Altered mental status. History of breast cancer. Initial encounter.  EXAM: MRI HEAD WITHOUT CONTRAST  TECHNIQUE: Multiplanar, multiecho pulse sequences of the brain and surrounding structures were obtained without intravenous contrast.  COMPARISON:  CT head 01/25/2015 earlier today.  FINDINGS: No evidence for acute infarction, hemorrhage, mass lesion, hydrocephalus, or extra-axial fluid. Generalized atrophy. Moderately advanced chronic microvascular ischemic change involving the periventricular and subcortical white matter, with lesser changes in the pons and cerebellum. Remote lacunar infarct in the RIGHT lentiform nucleus and LEFT thalamus. Flow voids are maintained with diminutive RIGHT vertebral. Tiny foci of susceptibility in the LEFT frontal white matter likely representing hypertensive microbleeds or post treatment effect. No acute osseous findings. No midline abnormality. BILATERAL cataract extraction. No sinus air-fluid level or significant mastoid fluid.  IMPRESSION: Chronic changes as described.  No intracranial mass lesion or evidence for acute infarction.  Good general agreement with prior CT.   Electronically Signed   By: Rolla Flatten M.D.   On: 01/25/2015 18:26    Medications:  Scheduled: . amLODipine  10 mg Oral Daily  . aspirin  81 mg Oral Daily  .  carvedilol  12.5 mg Oral BID WC  . colchicine  0.3 mg Oral Daily  . diclofenac sodium  2 g Topical QID  . ferrous sulfate  325 mg Oral BID WC  . furosemide  20 mg Oral Daily  . gabapentin  300 mg Oral BID  . heparin  5,000 Units Subcutaneous 3 times per day  . insulin aspart  0-9 Units Subcutaneous TID WC  . losartan  100 mg Oral QHS  . metFORMIN  1,000 mg Oral BID WC  . rosuvastatin  20 mg Oral QHS  . sodium chloride  3 mL Intravenous Q12H  . tamoxifen  20 mg Oral Daily    Assessment/Plan: 81 y.o. female hx of hypertension, CAD, DM presenting with acute onset of altered mental status and question of left facial droop. At arrival in ED noted to be encephalopathic with an otherwise unremarkable neurological exam. Mental status has improved from initial evaluation in the ED. Unclear etiology  of presentation.  Differential includes hypertensive encephalopathy/PRES vs TIA vs seizure/post ictal state. Based on description of event syncope would be in the differential though it would be atypical based on her presentation to the ED  -EEG -continue ASA 81mg  daily -agree with CT angio of neck to further evaluate posterior circulation. Suggest adding CT angio of head too -will continue to follow    LOS: 1 day   Jim Like, DO [facility] [phone]  If 7pm- 7am, please page neurology on call as listed in [place]. 01/26/2015  11:55 AM

## 2015-01-26 NOTE — Progress Notes (Signed)
Patient refused Plavix, she verbalized that her primary care doctor stopped this medication 5 years ago and refused this medication. MD paged.   Ave Filter, RN

## 2015-01-26 NOTE — Progress Notes (Signed)
UR completed 

## 2015-01-26 NOTE — Consult Note (Signed)
Reason for Consult: Syncope Referring Physician: Dr. Overton Mam [name] is an 81 y.o. female.  HPI: Patient is 81 year old female with past medical history significant for coronary artery disease history of non-Q-wave myocardial infarction in 2008 subsequently noted to have three-vessel CAD felt not the candidate for CABG and was treated percutaneously had PTCA stenting to proximal LAD. RCA and left circumflex were treated medically, history of congestive heart failure secondary to depressed LV systolic function which has markedly improved by recent echo, hypertension, diabetes mellitus, hypercholesteremia, chronic anemia, history of CVA of breast and C of colon, degenerative joint disease, was admitted yesterday with acute onset of altered mental status and questionable left facial droop followed by sudden syncopal episode. Patient was nonverbal and not moving her extremities for a few seconds was noted to be hypertensive with blood pressure of 220/90. Patient denies any palpitations prior to syncopal episode and no seizure activity was noted. Patient denies any chest pain nausea vomiting diaphoresis. States lately she gets tired and fatigued with no energy with minimal exertion.  Past Medical History  Diagnosis Date  . Breast cancer 10/2010    Invasive Ductal Carcinoma, s/p bilateral mastectomy, now on Tamoxifen. Followed by Dr Jamse Arn.   . Diabetes mellitus   . Hypertension   . Hyperlipidemia   . Anemia     Iron def anemia  . CAD (coronary artery disease)   . CHF (congestive heart failure)     Systolic   . History of colon cancer   . Neuropathy   . Allergy   . GERD (gastroesophageal reflux disease)   . Hiatal hernia   . Diverticulosis   . Asthma   . Arthritis   . H/O: GI bleed   . Breast cancer 2011    s/p Bilateral masectomy  . Shortness of breath   . Pneumonia 03/2012  . Carotid artery aneurysm 08/2010    right ICA, 5 x 54m  . DDD (degenerative disc disease), cervical   .  Colon cancer   . Colon cancer 12/11/2013  . Myocardial infarction   . Hypercalcemia 06/09/2014    Past Surgical History  Procedure Laterality Date  . Breast masectomy  10/2010    Bilateral masectomy by Dr NLucia Gaskinss/p invasive ductal carcinoma.  . Cardiac  catherization  2007    Severe 3-vessel disease.  EF 20-25%.  . Esophagogastroduodenoscopy  2001    Esophageal Tear  . Total knee arthroplasty  2001  . Esophagogastroduodenoscopy  10/27/2012    Procedure: ESOPHAGOGASTRODUODENOSCOPY (EGD);  Surgeon: JIrene Shipper MD;  [facility]  Service: Endoscopy;  Laterality: N/A;  pat  . Breast surgery    . Joint replacement Right 2001    Knee    Family History  Problem Relation Age of Onset  . Sickle cell anemia Other   . Heart disease Mother   . Heart disease Father   . Cancer Daughter 321   breast ca  . Hypertension Daughter   . Cancer Daughter 542   breast ca  . Hypertension Daughter   . Heart disease Son     Poor circulation-Left Leg  . Diabetes Daughter   . Hypertension Daughter   . Hyperlipidemia Daughter     Poor circulation- Toe amputation    Social History:  reports that she has never smoked. Her smokeless tobacco use includes Snuff. She reports that she does not drink alcohol or use illicit drugs.  Allergies:  Allergies  Allergen Reactions  . Peanuts [  Nuts] Swelling  . Lisinopril Cough    Medications: I have reviewed the patient's current medications.  Results for orders placed or performed during the hospital encounter of 01/25/15 (from the past 48 hour(s))  Ethanol     Status: None   Collection Time: 01/25/15  4:56 PM  Result Value Ref Range   Alcohol, Ethyl (B) <5 0 - 9 mg/dL    Comment:        LOWEST DETECTABLE LIMIT FOR SERUM ALCOHOL IS 11 mg/dL FOR MEDICAL PURPOSES ONLY   Protime-INR     Status: None   Collection Time: 01/25/15  4:56 PM  Result Value Ref Range   Prothrombin Time 14.3 11.6 - 15.2 seconds   INR 1.10 0.00 - 1.49  APTT      Status: None   Collection Time: 01/25/15  4:56 PM  Result Value Ref Range   aPTT 28 24 - 37 seconds  CBC     Status: Abnormal   Collection Time: 01/25/15  4:56 PM  Result Value Ref Range   WBC 4.3 4.0 - 10.5 K/uL   RBC 4.16 3.87 - 5.11 MIL/uL   Hemoglobin 9.3 (L) 12.0 - 15.0 g/dL   HCT 29.4 (L) 36.0 - 46.0 %   MCV 70.7 (L) 78.0 - 100.0 fL   MCH 22.4 (L) 26.0 - 34.0 pg   MCHC 31.6 30.0 - 36.0 g/dL   RDW 16.1 (H) 11.5 - 15.5 %   Platelets 163 150 - 400 K/uL  Differential     Status: None   Collection Time: 01/25/15  4:56 PM  Result Value Ref Range   Neutrophils Relative % 55 43 - 77 %   Neutro Abs 2.4 1.7 - 7.7 K/uL   Lymphocytes Relative 35 12 - 46 %   Lymphs Abs 1.5 0.7 - 4.0 K/uL   Monocytes Relative 8 3 - 12 %   Monocytes Absolute 0.4 0.1 - 1.0 K/uL   Eosinophils Relative 2 0 - 5 %   Eosinophils Absolute 0.1 0.0 - 0.7 K/uL   Basophils Relative 1 0 - 1 %   Basophils Absolute 0.0 0.0 - 0.1 K/uL  Comprehensive metabolic panel     Status: Abnormal   Collection Time: 01/25/15  4:56 PM  Result Value Ref Range   Sodium 138 135 - 145 mmol/L   Potassium 3.4 (L) 3.5 - 5.1 mmol/L   Chloride 106 96 - 112 mmol/L   CO2 22 19 - 32 mmol/L   Glucose, Bld 109 (H) 70 - 99 mg/dL   BUN 15 6 - 23 mg/dL   Creatinine, Ser 1.05 0.50 - 1.10 mg/dL   Calcium 10.2 8.4 - 10.5 mg/dL   Total Protein 7.0 6.0 - 8.3 g/dL   Albumin 3.6 3.5 - 5.2 g/dL   AST 15 0 - 37 U/L   ALT 9 0 - 35 U/L   Alkaline Phosphatase 32 (L) 39 - 117 U/L   Total Bilirubin 0.3 0.3 - 1.2 mg/dL   GFR calc non Af Amer 46 (L) >90 mL/min   GFR calc Af Amer 53 (L) >90 mL/min    Comment: (NOTE) The eGFR has been calculated using the CKD EPI equation. This calculation has not been validated in all clinical situations. eGFR's persistently <90 mL/min signify possible Chronic Kidney Disease.    Anion gap 10 5 - 15  I-Stat Troponin, ED (not at [facility])     Status: None   Collection Time: 01/25/15  5:00 PM  Result Value Ref  Range    Troponin i, poc 0.01 0.00 - 0.08 ng/mL   Comment 3            Comment: Due to the release kinetics of cTnI, a negative result within the first hours of the onset of symptoms does not rule out myocardial infarction with certainty. If myocardial infarction is still suspected, repeat the test at appropriate intervals.   I-Stat Chem 8, ED     Status: Abnormal   Collection Time: 01/25/15  5:01 PM  Result Value Ref Range   Sodium 142 135 - 145 mmol/L   Potassium 3.5 3.5 - 5.1 mmol/L   Chloride 106 96 - 112 mmol/L   BUN 16 6 - 23 mg/dL   Creatinine, Ser 1.00 0.50 - 1.10 mg/dL   Glucose, Bld 109 (H) 70 - 99 mg/dL   Calcium, Ion 1.33 (H) 1.13 - 1.30 mmol/L   TCO2 20 0 - 100 mmol/L   Hemoglobin 11.2 (L) 12.0 - 15.0 g/dL   HCT 33.0 (L) 36.0 - 46.0 %  Urine Drug Screen     Status: None   Collection Time: 01/25/15  6:42 PM  Result Value Ref Range   Opiates NONE DETECTED NONE DETECTED   Cocaine NONE DETECTED NONE DETECTED   Benzodiazepines NONE DETECTED NONE DETECTED   Amphetamines NONE DETECTED NONE DETECTED   Tetrahydrocannabinol NONE DETECTED NONE DETECTED   Barbiturates NONE DETECTED NONE DETECTED    Comment:        DRUG SCREEN FOR MEDICAL PURPOSES ONLY.  IF CONFIRMATION IS NEEDED FOR ANY PURPOSE, NOTIFY LAB WITHIN 5 DAYS.        LOWEST DETECTABLE LIMITS FOR URINE DRUG SCREEN Drug Class       Cutoff (ng/mL) Amphetamine      1000 Barbiturate      200 Benzodiazepine   825 Tricyclics       053 Opiates          300 Cocaine          300 THC              50   Urinalysis, Routine w reflex microscopic     Status: Abnormal   Collection Time: 01/25/15  6:42 PM  Result Value Ref Range   Color, Urine YELLOW YELLOW   APPearance CLEAR CLEAR   Specific Gravity, Urine 1.009 1.005 - 1.030   pH 7.5 5.0 - 8.0   Glucose, UA NEGATIVE NEGATIVE mg/dL   Hgb urine dipstick NEGATIVE NEGATIVE   Bilirubin Urine NEGATIVE NEGATIVE   Ketones, ur NEGATIVE NEGATIVE mg/dL   Protein, ur NEGATIVE  NEGATIVE mg/dL   Urobilinogen, UA 1.0 0.0 - 1.0 mg/dL   Nitrite NEGATIVE NEGATIVE   Leukocytes, UA SMALL (A) NEGATIVE  Urine microscopic-add on     Status: Abnormal   Collection Time: 01/25/15  6:42 PM  Result Value Ref Range   Squamous Epithelial / LPF RARE RARE   WBC, UA 3-6 <3 WBC/hpf   Bacteria, UA MANY (A) RARE  Glucose, capillary     Status: None   Collection Time: 01/25/15 11:14 PM  Result Value Ref Range   Glucose-Capillary 90 70 - 99 mg/dL   Comment 1 Notify RN    Comment 2 Documented in Char   Brain natriuretic peptide     Status: Abnormal   Collection Time: 01/25/15 11:36 PM  Result Value Ref Range   B Natriuretic Peptide 179.2 (H) 0.0 - 100.0 pg/mL  TSH     Status: None  Collection Time: 01/25/15 11:36 PM  Result Value Ref Range   TSH 2.493 0.350 - 4.500 uIU/mL  Ammonia     Status: Abnormal   Collection Time: 01/26/15 12:14 AM  Result Value Ref Range   Ammonia 36 (H) 11 - 32 umol/L  Basic metabolic panel     Status: Abnormal   Collection Time: 01/26/15  6:10 AM  Result Value Ref Range   Sodium 141 135 - 145 mmol/L   Potassium 3.3 (L) 3.5 - 5.1 mmol/L   Chloride 107 96 - 112 mmol/L   CO2 28 19 - 32 mmol/L   Glucose, Bld 97 70 - 99 mg/dL   BUN 11 6 - 23 mg/dL   Creatinine, Ser 0.94 0.50 - 1.10 mg/dL   Calcium 9.8 8.4 - 10.5 mg/dL   GFR calc non Af Amer 52 (L) >90 mL/min   GFR calc Af Amer 61 (L) >90 mL/min    Comment: (NOTE) The eGFR has been calculated using the CKD EPI equation. This calculation has not been validated in all clinical situations. eGFR's persistently <90 mL/min signify possible Chronic Kidney Disease.    Anion gap 6 5 - 15  CBC     Status: Abnormal   Collection Time: 01/26/15  6:10 AM  Result Value Ref Range   WBC 3.6 (L) 4.0 - 10.5 K/uL   RBC 4.15 3.87 - 5.11 MIL/uL   Hemoglobin 9.3 (L) 12.0 - 15.0 g/dL    Comment: RESULT REPEATED AND VERIFIED   HCT 29.3 (L) 36.0 - 46.0 %   MCV 70.6 (L) 78.0 - 100.0 fL   MCH 22.4 (L) 26.0 - 34.0 pg    MCHC 31.7 30.0 - 36.0 g/dL   RDW 15.8 (H) 11.5 - 15.5 %   Platelets 137 (L) 150 - 400 K/uL  Glucose, capillary     Status: Abnormal   Collection Time: 01/26/15  7:03 AM  Result Value Ref Range   Glucose-Capillary 111 (H) 70 - 99 mg/dL  Glucose, capillary     Status: Abnormal   Collection Time: 01/26/15 11:57 AM  Result Value Ref Range   Glucose-Capillary 116 (H) 70 - 99 mg/dL    X-ray Chest Pa And Lateral  01/26/2015   CLINICAL DATA:  Cough and right neck pain  EXAM: CHEST  2 VIEW  COMPARISON:  09/21/2014  FINDINGS: Generous heart size without interval change. There is mild aortic tortuosity which is also stable. The hila are negative. There is no edema, consolidation, effusion, or pneumothorax.  IMPRESSION: No active cardiopulmonary disease.   Electronically Signed   By: Monte Fantasia M.D.   On: 01/26/2015 10:52   Ct Head Wo Contrast  01/25/2015   CLINICAL DATA:  Code stroke.  Confusion.  Bilateral weakness.  EXAM: CT HEAD WITHOUT CONTRAST  TECHNIQUE: Contiguous axial images were obtained from the base of the skull through the vertex without intravenous contrast.  COMPARISON:  09/19/2014  FINDINGS: There is atrophy and chronic small vessel disease changes. No acute intracranial abnormality. Specifically, no hemorrhage, hydrocephalus, mass lesion, acute infarction, or significant intracranial injury. No acute calvarial abnormality.  IMPRESSION: No acute intracranial abnormality.  Atrophy, chronic microvascular disease.  Critical Value/emergent results were called by telephone at the time of interpretation on 01/25/2015 at 5:21 pm to Dr. Debby Freiberg , who verbally acknowledged these results.   Electronically Signed   By: Rolm Baptise M.D.   On: 01/25/2015 17:22   Mr Brain Wo Contrast  01/25/2015   CLINICAL DATA:  Code stroke. Last seen normal 1600 hours today. Altered mental status. History of breast cancer. Initial encounter.  EXAM: MRI HEAD WITHOUT CONTRAST  TECHNIQUE: Multiplanar,  multiecho pulse sequences of the brain and surrounding structures were obtained without intravenous contrast.  COMPARISON:  CT head 01/25/2015 earlier today.  FINDINGS: No evidence for acute infarction, hemorrhage, mass lesion, hydrocephalus, or extra-axial fluid. Generalized atrophy. Moderately advanced chronic microvascular ischemic change involving the periventricular and subcortical white matter, with lesser changes in the pons and cerebellum. Remote lacunar infarct in the RIGHT lentiform nucleus and LEFT thalamus. Flow voids are maintained with diminutive RIGHT vertebral. Tiny foci of susceptibility in the LEFT frontal white matter likely representing hypertensive microbleeds or post treatment effect. No acute osseous findings. No midline abnormality. BILATERAL cataract extraction. No sinus air-fluid level or significant mastoid fluid.  IMPRESSION: Chronic changes as described.  No intracranial mass lesion or evidence for acute infarction.  Good general agreement with prior CT.   Electronically Signed   By: Rolla Flatten M.D.   On: 01/25/2015 18:26    Review of Systems  Constitutional: Negative for fever and chills.  HENT: Negative for hearing loss.   Eyes: Negative for double vision and photophobia.  Respiratory: Negative for cough (The start), hemoptysis and sputum production.   Cardiovascular: Negative for chest pain, palpitations, orthopnea and claudication.  Gastrointestinal: Negative for nausea, vomiting and abdominal pain.  Genitourinary: Negative for dysuria and urgency.  Neurological: Negative for dizziness, tingling, focal weakness and headaches.   Blood pressure 144/56, pulse 73, temperature 99 F (37.2 C), temperature source Oral, resp. rate 18, height _0  (1.499 m), weight 57.607 kg (127 lb), SpO2 100 %. Physical Exam  Constitutional: She is oriented to person, place, and time.  HENT:  Head: Normocephalic and atraumatic.  Eyes: Conjunctivae are normal. Left eye exhibits no  discharge. No scleral icterus.  Neck: Normal range of motion. Neck supple. No JVD present. No tracheal deviation present. No thyromegaly present.  Cardiovascular: Normal rate and regular rhythm.   Murmur (soft systolic murmur noted) heard. Respiratory: Effort normal and breath sounds normal. No respiratory distress. She has no wheezes. She has no rales.  GI: Soft. Bowel sounds are normal. She exhibits no distension. There is no tenderness. There is no rebound.  Musculoskeletal: She exhibits no edema or tenderness.  Neurological: She is alert and oriented to person, place, and time.    Assessment/Plan: Status post syncope rule out TIA rule out seizures rule out cardiac arrhythmias Coronary artery disease history of non-Q-wave MI in the past Multivessel CAD status post PCI to LAD in the past History of ischemic cardiomyopathy Hypertension Diabetes mellitus Peripheral artery disease History of tobacco abuse History of CVA of colon and CL of breast. Status post hypertensive encephalopathy Anemia Plan Continue present management Check 2-D echo Discussed with patient and family regarding left cardiac catheterization possible PTCA stenting this risk and benefits wanted medical treatment for now. We will add clopidogrel 75 mg daily   Anadalay Macdonell N 01/26/2015, 1:25 PM      ( thank you

## 2015-01-26 NOTE — Progress Notes (Signed)
EEG Completed; Results Pending  

## 2015-01-26 NOTE — Evaluation (Signed)
Physical Therapy Evaluation Patient Details Name: [name] MRN: [id number] DOB: November 14, 1923 Today's Date: 01/26/2015   History of Present Illness  81 year old female with past medical history significant for coronary artery disease history of non-Q-wave myocardial infarction in 2008 subsequently noted to have three-vessel CAD felt not the candidate for CABG and was treated percutaneously had PTCA stenting to proximal LAD. RCA and left circumflex were treated medically, history of congestive heart failure secondary to depressed LV systolic function which has markedly improved by recent echo, hypertension, diabetes mellitus, hypercholesteremia, chronic anemia, history of CVA of breast and C of colon, degenerative joint disease, was admitted yesterday with acute onset of altered mental status and questionable left facial droop followed by sudden syncopal episode. Patient was nonverbal and not moving her extremities for a few seconds was noted to be hypertensive with blood pressure of 220/90. Patient denies any palpitations prior to syncopal episode and no seizure activity was noted  Clinical Impression  Pt presents with overall decreased balance, mobility and strength.  Tolerated gait in hallway with RW and stair negotiation to simulate home entry.  Requires min/guard to close S with max verbal cues on correct use of RW.  Recommend continued acute services to address deficits.  PT recommends HHPT for follow up at home to increase functional independence and safety.     Follow Up Recommendations Home health PT    Equipment Recommendations  None recommended by PT    Recommendations for Other Services       Precautions / Restrictions Precautions Precautions: Fall Restrictions Weight Bearing Restrictions: No      Mobility  Bed Mobility Overal bed mobility: Modified Independent             General bed mobility comments: Pt able to return to bed with HOB flat and without rails at mod I  level for increased time to complete task.   Transfers Overall transfer level: Needs assistance Equipment used: Rolling walker (2 wheeled) Transfers: Sit to/from Stand Sit to Stand: Min guard         General transfer comment: Min/guard to rise with cues for hand placement and safety.   Ambulation/Gait Ambulation/Gait assistance: Supervision;Min guard Ambulation Distance (Feet): 150 Feet Assistive device: Rolling walker (2 wheeled) Gait Pattern/deviations: Step-through pattern;Decreased stride length;Narrow base of support;Trunk flexed     General Gait Details: Pt requires constant max verbal cues for maintaining position inside of RW at all times.    Stairs Stairs: Yes Stairs assistance: Min assist Stair Management: One rail Right;Step to pattern;Sideways Number of Stairs: 4 General stair comments: Cues for safe sequencing and technique with use of single handrail ascending/descending sideways for safety.   Wheelchair Mobility    Modified Rankin (Stroke Patients Only)       Balance Overall balance assessment: Needs assistance Sitting-balance support: Feet supported Sitting balance-Leahy Scale: Fair     Standing balance support: During functional activity;Single extremity supported Standing balance-Leahy Scale: Fair Standing balance comment: Pt requires min/guard with use of RW to maintain balance.                              Pertinent Vitals/Pain Pain Assessment: No/denies pain    Home Living Family/patient expects to be discharged to:: Private residence Living Arrangements: Children Available Help at Discharge: Family;Available PRN/intermittently (son and daugther alternate care 24/7) Type of Home: House Home Access: Stairs to enter Entrance Stairs-Rails: Right Entrance Stairs-Number of Steps: 3 Home Layout:  One level Home Equipment: [place] - 2 wheels;Walker - 4 wheels;Cane - single point;Bedside commode;Shower seat Additional Comments: Family  states she uses walker and cane at home.      Prior Function Level of Independence: Needs assistance   Gait / Transfers Assistance Needed: Pt requires S for gait in house with use of RW or cane     Comments: Uses cane and RW at home as needed     Hand Dominance   Dominant Hand: Right    Extremity/Trunk Assessment               Lower Extremity Assessment: Generalized weakness      Cervical / Trunk Assessment: Kyphotic  Communication   Communication: No difficulties  Cognition Arousal/Alertness: Awake/alert Behavior During Therapy: WFL for tasks assessed/performed Overall Cognitive Status: Within Functional Limits for tasks assessed                      General Comments      Exercises        Assessment/Plan    PT Assessment Patient needs continued PT services  PT Diagnosis Difficulty walking;Generalized weakness   PT Problem List Decreased strength;Decreased activity tolerance;Decreased balance;Decreased mobility;Decreased knowledge of use of DME;Decreased safety awareness;Decreased knowledge of precautions  PT Treatment Interventions DME instruction;Gait training;Stair training;Functional mobility training;Therapeutic activities;Therapeutic exercise;Balance training;Patient/family education   PT Goals (Current goals can be found in the Care Plan section) Acute Rehab PT Goals Patient Stated Goal: to return home PT Goal Formulation: With patient Time For Goal Achievement: 02/02/15 Potential to Achieve Goals: Good    Frequency Min 3X/week   Barriers to discharge        Co-evaluation               End of Session   Activity Tolerance: Patient tolerated treatment well Patient left: in bed;with call bell/phone within reach;with family/visitor present Nurse Communication: Mobility status (that pt had snuff in room)    Functional Assessment Tool Used: Clinical judgement Functional Limitation: Mobility: Walking and moving around Mobility:  Walking and Moving Around Current Status (Z6629): At least 1 percent but less than 20 percent impaired, limited or restricted Mobility: Walking and Moving Around Goal Status [phone]): At least 1 percent but less than 20 percent impaired, limited or restricted    Time: 6503-5465 PT Time Calculation (min) (ACUTE ONLY): 24 min   Charges:   PT Evaluation $Initial PT Evaluation Tier I: 1 Procedure PT Treatments $Gait Training: 8-22 mins   PT G Codes:   PT G-Codes **NOT FOR INPATIENT CLASS** Functional Assessment Tool Used: Clinical judgement Functional Limitation: Mobility: Walking and moving around Mobility: Walking and Moving Around Current Status (K8127): At least 1 percent but less than 20 percent impaired, limited or restricted Mobility: Walking and Moving Around Goal Status [phone]): At least 1 percent but less than 20 percent impaired, limited or restricted    Denice Bors 01/26/2015, 2:42 PM

## 2015-01-26 NOTE — Progress Notes (Signed)
Family Medicine Teaching Service Daily Progress Note Intern Pager: [phone]  [name] Medical record number: [id number] Date of birth: 1924/07/20 Age: 81 y.o. Gender: female  Primary Care Provider: [name], MD Consultants: Neuro, Cards Code Status: DNR  Pt Overview and Major Events to Date:  3/15: Admitted for AMS   Assessment and Plan: [name] is a 81 y.o. female presenting with acute onset altered mental status. PMH is significant for HTN, CAD, PAD, DM, hyperlipidemia, tobacco abuse, asthma, GERD, hx of colon and breast cancer, and h/o MI.  #AMS: Possibly due to syncopal episode vs hypertensive encephalopathy. Other ddx include: PRES vs seizure/post ictal state vs orthostatic vs TIA vs cardiogenic. Symptoms largely resolved on presentation to hospital. Head CT and MRI negative for acute change, EKG unchanged, troponin negative. No clear description of focal neuro deficit. Did have h/o CT angio neck 09/2013 with diffuse atherosclerotic disease. No change in med/PO/symptoms prior to this occurring today. UDS unremarkable. Patient returned back to baseline without deficits. -continue on telemtry - Neurochecks - f/u neuro recs including EEG; TSH wnl - ordered CTA of neck to further access posterior circulation atherosclerotic disease - Echocardiogram pending  -AM EKG to eval for cardiogenic syncope pending  -cardiology consulted to help with further syncopal work-up;  can consider loop rcdr at d/c -Orthostatics pending -PT consulted  # HTN/hypertensive emergency/hypertensive encephalopathy: Patient with hypertensive emergency with possible encephalopathy. BP elevated to 220s SBP in ED.  Pressures continue to be elevated with pulse pressures >110. - Continue home coreg, norvasc, losartan, and lasix   -norvasc dosed to daily vs BID as on med rec - Monitor BP with gradual lowering - Continue IV Labetolol PRN for BP control - Could also begin eval for  2ndary causes (will discuss discontinuation of tamoxifen with family) -cardiology consulted; appreciate recs  #CHF: No evidence of exacerbation. Minimal volume overload appreciated with trace edema, some JVD, and crackles on bilateral lung bases. Last echo in March 2015 showed normal EF and grade 1 diastolic dysfunction. No shortness of breath. - 2 view CXR no acute process - continue meds as above -BNP elevated 179 - Monitor volume status  - strict I/Os  #Cough: productive, non-discolored phlegm produced.  -CXR ordered unremarkablw -Tessalon prn for cough  #UTI: Asymptomatic. Many bacteria on UA. No nitrites and small leukocytes. -will not treat at this time -continue to monitor; treat if symtpomatic  #Anemia: Hbg 9.3. MCV 70. H/o iron deficiency not currently on iron. Stable, baseline 9-10.  -trend CBC  -start patient on iron  #DM: A1c 6.7 this month - Continue home metformin - sensitive SSI  #CAD/PAD: - continue home ASA and crestor  FEN/GI: heart healthy/carb mod diet, SLIV, home H2 blocker Prophylaxis: SQ heparin  Disposition: Continue current management; pending further work-up.  Subjective:  Patient doing well this morning. She only endorses a cough. She continues to have L. knee stiffness and limited ROM.   There appears to have been an event this morning that resulted in the patient hurting her neck due to transfer methods. She asked for a new nurse due to this. Otherwise explained to the patient and family that our work-up so far has been unremarkable.   Objective: Temp:  [97.7 F (36.5 C)-98.6 F (37 C)] 98.6 F (37 C) (03/15 0200) Pulse Rate:  [67-80] 80 (03/15 0811) Resp:  [14-21] 18 (03/15 0200) BP: (132-216)/(50-79) 173/59 mmHg (03/15 0811) SpO2:  [97 %-100 %] 98 % (03/15 0200) Weight:  [  127 lb (57.607 kg)] 127 lb (57.607 kg) (03/15 0500) Physical Exam: General: frail elderly woman, lying in bed, NAD, pleasant HEENT: MMM, dark tongue, NCAT, EOMI,  arcus senilis present bilaterally Cardiovascular: RRR, no m/r/g, distal pulses intact, 7 cm JVD bilaterally Respiratory: normal WOB, crackles appreciated at bilateral lung bases, L>R. No wheezes. Abdomen: soft, NTND, +BS Extremities: WWP, trace LE edema bilaterally. LLE with limited ROM at hip and knee joints with no tenderness greater trochanter.  Skin: no rashes, thin  Neuro: alert and oriented, no focal deficits, CN 2-12 tested intact. 5/5 strength bilateral UE and bilateral grip; 4+/5 left hip flexion, 5/5/ right hip flexion, sensation intact. Normal speech  Laboratory: Results for orders placed or performed during the hospital encounter of 01/25/15 (from the past 24 hour(s))  Ethanol     Status: None   Collection Time: 01/25/15  4:56 PM  Result Value Ref Range   Alcohol, Ethyl (B) <5 0 - 9 mg/dL  Protime-INR     Status: None   Collection Time: 01/25/15  4:56 PM  Result Value Ref Range   Prothrombin Time 14.3 11.6 - 15.2 seconds   INR 1.10 0.00 - 1.49  APTT     Status: None   Collection Time: 01/25/15  4:56 PM  Result Value Ref Range   aPTT 28 24 - 37 seconds  CBC     Status: Abnormal   Collection Time: 01/25/15  4:56 PM  Result Value Ref Range   WBC 4.3 4.0 - 10.5 K/uL   RBC 4.16 3.87 - 5.11 MIL/uL   Hemoglobin 9.3 (L) 12.0 - 15.0 g/dL   HCT 29.4 (L) 36.0 - 46.0 %   MCV 70.7 (L) 78.0 - 100.0 fL   MCH 22.4 (L) 26.0 - 34.0 pg   MCHC 31.6 30.0 - 36.0 g/dL   RDW 16.1 (H) 11.5 - 15.5 %   Platelets 163 150 - 400 K/uL  Differential     Status: None   Collection Time: 01/25/15  4:56 PM  Result Value Ref Range   Neutrophils Relative % 55 43 - 77 %   Neutro Abs 2.4 1.7 - 7.7 K/uL   Lymphocytes Relative 35 12 - 46 %   Lymphs Abs 1.5 0.7 - 4.0 K/uL   Monocytes Relative 8 3 - 12 %   Monocytes Absolute 0.4 0.1 - 1.0 K/uL   Eosinophils Relative 2 0 - 5 %   Eosinophils Absolute 0.1 0.0 - 0.7 K/uL   Basophils Relative 1 0 - 1 %   Basophils Absolute 0.0 0.0 - 0.1 K/uL   Comprehensive metabolic panel     Status: Abnormal   Collection Time: 01/25/15  4:56 PM  Result Value Ref Range   Sodium 138 135 - 145 mmol/L   Potassium 3.4 (L) 3.5 - 5.1 mmol/L   Chloride 106 96 - 112 mmol/L   CO2 22 19 - 32 mmol/L   Glucose, Bld 109 (H) 70 - 99 mg/dL   BUN 15 6 - 23 mg/dL   Creatinine, Ser 1.05 0.50 - 1.10 mg/dL   Calcium 10.2 8.4 - 10.5 mg/dL   Total Protein 7.0 6.0 - 8.3 g/dL   Albumin 3.6 3.5 - 5.2 g/dL   AST 15 0 - 37 U/L   ALT 9 0 - 35 U/L   Alkaline Phosphatase 32 (L) 39 - 117 U/L   Total Bilirubin 0.3 0.3 - 1.2 mg/dL   GFR calc non Af Amer 46 (L) >90 mL/min   GFR calc  Af Amer 53 (L) >90 mL/min   Anion gap 10 5 - 15  I-Stat Troponin, ED (not at [facility])     Status: None   Collection Time: 01/25/15  5:00 PM  Result Value Ref Range   Troponin i, poc 0.01 0.00 - 0.08 ng/mL   Comment 3          I-Stat Chem 8, ED     Status: Abnormal   Collection Time: 01/25/15  5:01 PM  Result Value Ref Range   Sodium 142 135 - 145 mmol/L   Potassium 3.5 3.5 - 5.1 mmol/L   Chloride 106 96 - 112 mmol/L   BUN 16 6 - 23 mg/dL   Creatinine, Ser 1.00 0.50 - 1.10 mg/dL   Glucose, Bld 109 (H) 70 - 99 mg/dL   Calcium, Ion 1.33 (H) 1.13 - 1.30 mmol/L   TCO2 20 0 - 100 mmol/L   Hemoglobin 11.2 (L) 12.0 - 15.0 g/dL   HCT 33.0 (L) 36.0 - 46.0 %  Urine Drug Screen     Status: None   Collection Time: 01/25/15  6:42 PM  Result Value Ref Range   Opiates NONE DETECTED NONE DETECTED   Cocaine NONE DETECTED NONE DETECTED   Benzodiazepines NONE DETECTED NONE DETECTED   Amphetamines NONE DETECTED NONE DETECTED   Tetrahydrocannabinol NONE DETECTED NONE DETECTED   Barbiturates NONE DETECTED NONE DETECTED  Urinalysis, Routine w reflex microscopic     Status: Abnormal   Collection Time: 01/25/15  6:42 PM  Result Value Ref Range   Color, Urine YELLOW YELLOW   APPearance CLEAR CLEAR   Specific Gravity, Urine 1.009 1.005 - 1.030   pH 7.5 5.0 - 8.0   Glucose, UA NEGATIVE NEGATIVE mg/dL    Hgb urine dipstick NEGATIVE NEGATIVE   Bilirubin Urine NEGATIVE NEGATIVE   Ketones, ur NEGATIVE NEGATIVE mg/dL   Protein, ur NEGATIVE NEGATIVE mg/dL   Urobilinogen, UA 1.0 0.0 - 1.0 mg/dL   Nitrite NEGATIVE NEGATIVE   Leukocytes, UA SMALL (A) NEGATIVE  Urine microscopic-add on     Status: Abnormal   Collection Time: 01/25/15  6:42 PM  Result Value Ref Range   Squamous Epithelial / LPF RARE RARE   WBC, UA 3-6 <3 WBC/hpf   Bacteria, UA MANY (A) RARE  Glucose, capillary     Status: None   Collection Time: 01/25/15 11:14 PM  Result Value Ref Range   Glucose-Capillary 90 70 - 99 mg/dL   Comment 1 Notify RN    Comment 2 Documented in Char   Brain natriuretic peptide     Status: Abnormal   Collection Time: 01/25/15 11:36 PM  Result Value Ref Range   B Natriuretic Peptide 179.2 (H) 0.0 - 100.0 pg/mL  TSH     Status: None   Collection Time: 01/25/15 11:36 PM  Result Value Ref Range   TSH 2.493 0.350 - 4.500 uIU/mL  Ammonia     Status: Abnormal   Collection Time: 01/26/15 12:14 AM  Result Value Ref Range   Ammonia 36 (H) 11 - 32 umol/L  Basic metabolic panel     Status: Abnormal   Collection Time: 01/26/15  6:10 AM  Result Value Ref Range   Sodium 141 135 - 145 mmol/L   Potassium 3.3 (L) 3.5 - 5.1 mmol/L   Chloride 107 96 - 112 mmol/L   CO2 28 19 - 32 mmol/L   Glucose, Bld 97 70 - 99 mg/dL   BUN 11 6 - 23 mg/dL  Creatinine, Ser 0.94 0.50 - 1.10 mg/dL   Calcium 9.8 8.4 - 10.5 mg/dL   GFR calc non Af Amer 52 (L) >90 mL/min   GFR calc Af Amer 61 (L) >90 mL/min   Anion gap 6 5 - 15  CBC     Status: Abnormal   Collection Time: 01/26/15  6:10 AM  Result Value Ref Range   WBC 3.6 (L) 4.0 - 10.5 K/uL   RBC 4.15 3.87 - 5.11 MIL/uL   Hemoglobin 9.3 (L) 12.0 - 15.0 g/dL   HCT 29.3 (L) 36.0 - 46.0 %   MCV 70.6 (L) 78.0 - 100.0 fL   MCH 22.4 (L) 26.0 - 34.0 pg   MCHC 31.7 30.0 - 36.0 g/dL   RDW 15.8 (H) 11.5 - 15.5 %   Platelets 137 (L) 150 - 400 K/uL  Glucose, capillary      Status: Abnormal   Collection Time: 01/26/15  7:03 AM  Result Value Ref Range   Glucose-Capillary 111 (H) 70 - 99 mg/dL   Imaging/Diagnostic Tests: Ct Head Wo Contrast 01/25/2015  IMPRESSION: No acute intracranial abnormality.  Atrophy, chronic microvascular disease.    Mr Brain Wo Contrast 01/25/2015  IMPRESSION: Chronic changes as described.  No intracranial mass lesion or evidence for acute infarction.  Good general agreement with prior CT.    [name] Shams, DO 01/26/2015, 8:23 AM PGY-1, [place] Intern pager: [phone], text pages welcome

## 2015-01-27 DIAGNOSIS — I2510 Atherosclerotic heart disease of native coronary artery without angina pectoris: Secondary | ICD-10-CM | POA: Diagnosis not present

## 2015-01-27 DIAGNOSIS — I674 Hypertensive encephalopathy: Secondary | ICD-10-CM | POA: Diagnosis not present

## 2015-01-27 DIAGNOSIS — I10 Essential (primary) hypertension: Secondary | ICD-10-CM | POA: Diagnosis not present

## 2015-01-27 DIAGNOSIS — R55 Syncope and collapse: Secondary | ICD-10-CM | POA: Diagnosis not present

## 2015-01-27 DIAGNOSIS — I739 Peripheral vascular disease, unspecified: Secondary | ICD-10-CM | POA: Diagnosis not present

## 2015-01-27 DIAGNOSIS — E119 Type 2 diabetes mellitus without complications: Secondary | ICD-10-CM | POA: Diagnosis not present

## 2015-01-27 LAB — BASIC METABOLIC PANEL
Anion gap: 9 (ref 5–15)
BUN: 18 mg/dL (ref 6–23)
CHLORIDE: 106 mmol/L (ref 96–112)
CO2: 22 mmol/L (ref 19–32)
Calcium: 10.3 mg/dL (ref 8.4–10.5)
Creatinine, Ser: 1.17 mg/dL — ABNORMAL HIGH (ref 0.50–1.10)
GFR calc Af Amer: 46 mL/min — ABNORMAL LOW (ref >90)
GFR, EST NON AFRICAN AMERICAN: 40 mL/min — AB (ref >90)
GLUCOSE: 132 mg/dL — AB (ref 70–99)
POTASSIUM: 4 mmol/L (ref 3.5–5.1)
Sodium: 137 mmol/L (ref 135–145)

## 2015-01-27 LAB — CBC
HCT: 29.8 % — ABNORMAL LOW (ref 36.0–46.0)
Hemoglobin: 9.4 g/dL — ABNORMAL LOW (ref 12.0–15.0)
MCH: 22.3 pg — ABNORMAL LOW (ref 26.0–34.0)
MCHC: 31.5 g/dL (ref 30.0–36.0)
MCV: 70.6 fL — ABNORMAL LOW (ref 78.0–100.0)
PLATELETS: 140 10*3/uL — AB (ref 150–400)
RBC: 4.22 MIL/uL (ref 3.87–5.11)
RDW: 15.8 % — ABNORMAL HIGH (ref 11.5–15.5)
WBC: 4.8 10*3/uL (ref 4.0–10.5)

## 2015-01-27 LAB — GLUCOSE, CAPILLARY
GLUCOSE-CAPILLARY: 119 mg/dL — AB (ref 70–99)
GLUCOSE-CAPILLARY: 88 mg/dL (ref 70–99)
Glucose-Capillary: 136 mg/dL — ABNORMAL HIGH (ref 70–99)
Glucose-Capillary: 116 mg/dL — ABNORMAL HIGH (ref 70–99)

## 2015-01-27 MED ORDER — LOSARTAN POTASSIUM 100 MG PO TABS: 50.0000 mg | ORAL_TABLET | Freq: Every day | ORAL | Status: DC

## 2015-01-27 MED ORDER — FUROSEMIDE 20 MG PO TABS: 10.0000 mg | ORAL_TABLET | Freq: Every day | ORAL | Status: DC | PRN

## 2015-01-27 MED ORDER — CLOPIDOGREL BISULFATE 75 MG PO TABS: 75.0000 mg | ORAL_TABLET | Freq: Every day | ORAL | Status: DC

## 2015-01-27 MED ORDER — FERROUS SULFATE 325 (65 FE) MG PO TABS: 325.0000 mg | ORAL_TABLET | Freq: Every day | ORAL | Status: DC

## 2015-01-27 MED ORDER — AMLODIPINE BESYLATE 5 MG PO TABS
5.0000 mg | ORAL_TABLET | Freq: Every day | ORAL | Status: DC
  Administered 2015-01-28: 5 mg via ORAL
  Filled 2015-01-27 (×3): qty 1

## 2015-01-27 MED ORDER — AMLODIPINE BESYLATE 5 MG PO TABS: 5.0000 mg | ORAL_TABLET | Freq: Every day | ORAL | Status: DC

## 2015-01-27 NOTE — Progress Notes (Signed)
Pt Transported to Radiology for CT at 2110 and returned 2155, approx 0200hrs Radiology called to advise that Pt had refused Dye and CT was not completed. On call notified and responded within 5 minutes of page, questioned why notification had taken so long since refusal. Could only advise that he was paged by me within a few minutes of my knowledge of the CT not being completed. Spoke with the Pt a short time later who advised that she just did not want to take the dye and had no intention of taking.

## 2015-01-27 NOTE — Discharge Summary (Signed)
[facility] Discharge Summary  [name] Medical record number: [id number] Date of birth: 03-16-1924 Age: 81 y.o. Gender: female Date of Admission: 01/25/2015  Date of Discharge: 01/27/2014 Admitting Physician: Zenia Resides, MD  Primary Care Provider: [name], MD Consultants: Neuro, Cards  Indication for Hospitalization: AMS  Discharge Diagnoses/Problem List:  Patient Active Problem List   Diagnosis Date Noted  . Stroke-like symptoms   . Stroke-like episode 01/25/2015  . Syncope and collapse 01/25/2015  . Right leg pain   . Accelerated hypertension   . Hypertensive emergency 09/19/2014  . Dizziness 09/19/2014  . Anemia of chronic illness 08/19/2014  . Acute superficial venous thrombosis of right lower extremity 08/19/2014  . Right ankle pain 08/10/2014  . Hypercalcemia 06/09/2014  . Breast cancer, left breast 06/09/2014  . Breast cancer, right breast 06/09/2014  . Edema of left lower extremity 03/30/2014  . PAD (peripheral artery disease) 03/30/2014  . Colon cancer 12/11/2013  . Neck pain 10/22/2013  . Light headed 06/16/2013  . History of fall 05/15/2013  . Right thigh pain 05/15/2013  . Memory deficit 05/15/2013  . Bilateral leg pain 03/07/2013  . Trigger thumb of right hand 03/07/2013  . Lower extremity edema 02/19/2013  . Weakness generalized 01/22/2013  . Stricture and stenosis of esophagus 10/27/2012  . Poor social situation 09/13/2012  . Low back pain 08/07/2012  . Musculoskeletal chest pain 07/10/2012  . Thoracic back pain 05/24/2012  . Productive cough 04/30/2012  . Weight loss 04/03/2012  . Insomnia 09/25/2011  . Tobacco abuse 06/02/2011  . History of breast cancer in female, bilateral.  Mastectomies 10/24/2010. 04/26/2011  . Hiatal hernia 03/22/2011  . Colon polyp 03/22/2011  . Poor circulation 03/22/2011  . Other malaise and fatigue 03/22/2011  . 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  NEUROPATHY 08/25/2009  . ANEMIA-IRON DEFICIENCY 04/29/2007  . HLD (hyperlipidemia) 01/29/2007  . Diabetes type 2, controlled 01/10/2007  . HYPERTENSION, BENIGN SYSTEMIC 01/10/2007  . Coronary atherosclerosis 01/10/2007  . Chronic systolic heart failure 78/29/5621  . Asthma 01/10/2007  . Reflux esophagitis 01/10/2007  . ARTHRITIS 01/10/2007    Disposition: Home with [facility] PT and RN  Discharge Condition: Improved  Discharge Exam:  Filed Vitals:   01/28/15 0934  BP: 152/45  Pulse: 69  Temp: 98.2 F (36.8 C)  Resp: 18   General: frail elderly woman, lying in bed, NAD, pleasant, awake HEENT: MMM, NCAT, EOMI, arcus senilis present bilaterally Cardiovascular: RRR, no m/r/g Respiratory: normal WOB, CTAB, No wheezes or crackles Abdomen: soft, NTND, +BS Extremities: WWP, LLE with limited ROM at hip and knee joints with no tenderness greater trochanter.  Skin: no rashes, thin  Neuro: alert and oriented, no focal deficits  Brief Hospital Course:  [name] is a 81 y.o. female who presented with acute onset altered mental status. PMH is significant for HTN, CAD, PAD, DM, hyperlipidemia, tobacco abuse, asthma, GERD, hx of colon and breast cancer, and h/o MI.  AMS status post hypertensive encephalopathy: Symptoms largely resolved on presentation to hospital. Head CT and MRI negative for acute change, EKG unchanged, troponin negative. No clear focal neuro deficit to indicate TIA/CVA. Neurology was consulted and will follow-up as an outpatient but believe this event was not neurologic in nature. Did have h/o CT angio neck 09/2013 with diffuse atherosclerotic disease; however carotid and posterior circulation dopplers patent this admission. UDS unremarkable. Most likely diagnosis was hypertensive encephalopathy. On presentation patient had elevated BPs 308M systolic  with a MAP >110. Echo performed showed grade 1 diastolic dysfunction with normal cavity and EF. Cardiology was consulted and  suggested adding Plavix for dual antiplatelet therapy given patient's CAD. Patient's elevated pressures followed by low pressures on home medications during hospitalization reflects possible medication non-adherence. Patient's HTN medications adjusted this admission. She will go home with PT and a [facility] RN.   The patient's other chronic conditions were stable during this hospitalization and the patient was continued on home medications.   Issues for Follow Up:  1. Monitor BPs and adjust medications as needed 2. Follow-up anemia 3. Follow-up medication compliance 4. HH PT and RN  Significant Procedures: None  Significant Labs and Imaging:   Recent Labs Lab 01/25/15 1656 01/25/15 1701 01/26/15 0610 01/27/15 0830  WBC 4.3  --  3.6* 4.8  HGB 9.3* 11.2* 9.3* 9.4*  HCT 29.4* 33.0* 29.3* 29.8*  PLT 163  --  137* 140*    Recent Labs Lab 01/25/15 1656 01/25/15 1701 01/26/15 0610 01/27/15 0830  NA 138 142 141 137  K 3.4* 3.5 3.3* 4.0  CL 106 106 107 106  CO2 22  --  28 22  GLUCOSE 109* 109* 97 132*  BUN 15 16 11 18   CREATININE 1.05 1.00 0.94 1.17*  CALCIUM 10.2  --  9.8 10.3  ALKPHOS 32*  --   --   --   AST 15  --   --   --   ALT 9  --   --   --   ALBUMIN 3.6  --   --   --    Ct Head Wo Contrast 01/25/2015 IMPRESSION: No acute intracranial abnormality. Atrophy, chronic microvascular disease.   Mr Brain Wo Contrast 01/25/2015 IMPRESSION: Chronic changes as described. No intracranial mass lesion or evidence for acute infarction. Good general agreement with prior CT.   Results/Tests Pending at Time of Discharge: None  Discharge Medications:    Medication List    STOP taking these medications        carvedilol 12.5 MG tablet  Commonly known as:  COREG     tamoxifen 20 MG tablet  Commonly known as:  NOLVADEX      TAKE these medications        albuterol 108 (90 BASE) MCG/ACT inhaler  Commonly known as:  PROVENTIL HFA;VENTOLIN HFA  Inhale 2 puffs into the lungs  every 6 (six) hours as needed. For shortness of breath     amLODipine 5 MG tablet  Commonly known as:  NORVASC  Take 1 tablet (5 mg total) by mouth daily.     ASPIRIN CHILDRENS 81 MG chewable tablet  Generic drug:  aspirin  Chew 81 mg by mouth daily.     clopidogrel 75 MG tablet  Commonly known as:  PLAVIX  Take 1 tablet (75 mg total) by mouth daily.     colchicine 0.6 MG tablet  Take 0.5 tablets (0.3 mg total) by mouth daily.     cyclobenzaprine 5 MG tablet  Commonly known as:  FLEXERIL  Take 0.5-1 tablets (2.5-5 mg total) by mouth 3 (three) times daily as needed for muscle spasms.     diclofenac sodium 1 % Gel  Commonly known as:  VOLTAREN  Apply 2 g topically 4 (four) times daily.     ferrous sulfate 325 (65 FE) MG tablet  Take 1 tablet (325 mg total) by mouth daily with breakfast.     furosemide 20 MG tablet  Commonly known as:  LASIX  Take 0.5 tablets (10 mg total) by mouth daily as needed for fluid or edema.     gabapentin 300 MG capsule  Commonly known as:  NEURONTIN  Take 300 mg by mouth 2 (two) times daily.     glucose blood test strip  - Use as instructed  - Dx. Code 250.02     losartan 50 MG tablet  Commonly known as:  COZAAR  Take 1 tablet (50 mg total) by mouth at bedtime.     metFORMIN 500 MG tablet  Commonly known as:  GLUCOPHAGE  TAKE 2 TABLETS (1,000 MG TOTAL) BY MOUTH 2 (TWO) TIMES DAILY WITH A MEAL.     MILK OF MAGNESIA 400 MG/5ML suspension  Generic drug:  magnesium hydroxide  Take 30 mLs by mouth daily as needed for mild constipation.     mirtazapine 15 MG tablet  Commonly known as:  REMERON  Take 15 mg by mouth at bedtime as needed (sleep).     ondansetron 4 MG tablet  Commonly known as:  ZOFRAN  Take 1 tablet (4 mg total) by mouth every 8 (eight) hours as needed for nausea or vomiting.     ranitidine 150 MG capsule  Commonly known as:  ZANTAC  TAKE ONE CAPSULE BY MOUTH TWICE A DAY AS NEEDED FOR HEARTBURN     rosuvastatin 20 MG  tablet  Commonly known as:  CRESTOR  Take 1 tablet (20 mg total) by mouth at bedtime.     traMADol 50 MG tablet  Commonly known as:  ULTRAM  Take 1 tablet (50 mg total) by mouth every 12 (twelve) hours as needed for severe pain.        Discharge Instructions: Please refer to Patient Instructions section of EMR for full details.  Patient was counseled important signs and symptoms that should prompt return to medical care, changes in medications, dietary instructions, activity restrictions, and follow up appointments.   Follow-Up Appointments: Follow-up Information    Follow up with [name], MD On 02/09/2015.   Specialty:  Family Medicine   Why:  @9 :30a for hospital follow-up   Contact information:   [address] [place] [address] [phone]       Follow up with [facility].   Why:  they will do your home health physical therapy at your home   Contact information:   [place] [address] [phone]       Follow up with Clent Demark, MD. Schedule an appointment as soon as possible for a visit in 2 weeks.   Specialty:  Cardiology   Why:  for hospital follow-up   Contact information:   [address] [place] [address] [phone]       Follow up with SUMNER, [place], DO In 1 month.   Specialty:  Neurology   Why:  for hospital follow-up   Contact information:   [address] [place] [address] [place], DO 01/28/2015, 3:26 PM PGY-1, [place]

## 2015-01-27 NOTE — Progress Notes (Signed)
Talked to patient about [facility] choices, patient requested [facility] for [facility] services. Mary with [facility] called for arrangements; Mindi Slicker RN,BSN,MHA [phone]

## 2015-01-27 NOTE — Progress Notes (Signed)
Subjective:  Patient denies any chest pain states feeling much better today. Reviewed angiogram from the past patient had significant stenosis and left circumflex with haziness which can be treated percutaneously without much risk and RCA lesion appears to be moderate but can be treated medically. Discussed with patient and family regarding invasive versus medical therapy patient wanted to be treated medically for now.  Objective:  Vital Signs in the last 24 hours: Temp:  [98 F (36.7 C)-100.8 F (38.2 C)] 98.1 F (36.7 C) (03/16 1731) Pulse Rate:  [73-78] 74 (03/16 1731) Resp:  [14-18] 18 (03/16 1731) BP: (92-150)/(40-54) 117/51 mmHg (03/16 1731) SpO2:  [90 %-100 %] 90 % (03/16 1731) Weight:  [53.298 kg (117 lb 8 oz)] 53.298 kg (117 lb 8 oz) (03/16 0500)  Intake/Output from previous day: 03/15 0701 - 03/16 0700 In: 720 [P.O.:720] Out: -  Intake/Output from this shift:    Physical Exam: Neck: no adenopathy, no carotid bruit, no JVD and supple, symmetrical, trachea midline Lungs: clear to auscultation bilaterally Heart: regular rate and rhythm, S1, S2 normal and Soft systolic murmur noted Abdomen: soft, non-tender; bowel sounds normal; no masses,  no organomegaly Extremities: extremities normal, atraumatic, no cyanosis or edema  Lab Results:  Recent Labs  01/26/15 0610 01/27/15 0830  WBC 3.6* 4.8  HGB 9.3* 9.4*  PLT 137* 140*    Recent Labs  01/26/15 0610 01/27/15 0830  NA 141 137  K 3.3* 4.0  CL 107 106  CO2 28 22  GLUCOSE 97 132*  BUN 11 18  CREATININE 0.94 1.17*   No results for input(s): TROPONINI in the last 72 hours.  Invalid input(s): CK, MB Hepatic Function Panel  Recent Labs  01/25/15 1656  PROT 7.0  ALBUMIN 3.6  AST 15  ALT 9  ALKPHOS 32*  BILITOT 0.3   No results for input(s): CHOL in the last 72 hours. No results for input(s): PROTIME in the last 72 hours.  Imaging: Imaging results have been reviewed and X-ray Chest Pa And  Lateral  01/26/2015   CLINICAL DATA:  Cough and right neck pain  EXAM: CHEST  2 VIEW  COMPARISON:  09/21/2014  FINDINGS: Generous heart size without interval change. There is mild aortic tortuosity which is also stable. The hila are negative. There is no edema, consolidation, effusion, or pneumothorax.  IMPRESSION: No active cardiopulmonary disease.   Electronically Signed   By: Monte Fantasia M.D.   On: 01/26/2015 10:52    Cardiac Studies:  Assessment/Plan:  Status post syncope rule out TIA rule out seizures rule out cardiac arrhythmias Coronary artery disease history of non-Q-wave MI in the past Multivessel CAD status post PCI to LAD in the past History of ischemic cardiomyopathy Hypertension Diabetes mellitus Peripheral artery disease History of tobacco abuse History of CVA of colon and CL of breast. Status post hypertensive encephalopathy Anemia Plan Continue present management Neuro workup in progress Check 2-D echo Medical management for now  LOS: 2 days    Ninel Abdella N 01/27/2015, 6:33 PM

## 2015-01-27 NOTE — Discharge Instructions (Signed)
Discharge Date: 01/27/2015  Reason for Hospitalization: Altered mental status  We believe that the main reason for your acute event on admission was due to elevated pressures. However we noticed in the hospital that your pressures were a little low. Your blood pressure medications have been adjusted so please take all medications as prescribed. Doctors in the hospital also suggested that you take a medication to prevent your blood from clotting called Clopidogrel. While on this medication please monitor for bleeding. Please make sure to attend your PCP appointment that is scheduled.   Home health PT and RN have been scheduled.   Stop taking Tamoxifen; no longer needed.   New medications:  Clopidogrel and aspirin to help prevent stroke  Thank you for letting us participate in your care!

## 2015-01-27 NOTE — Progress Notes (Signed)
  Echocardiogram 2D Echocardiogram has been performed.  [name] 01/27/2015, 3:42 PM

## 2015-01-27 NOTE — Progress Notes (Signed)
Subjective: Patient improved significantly. Now alert and oriented following all commands. No over night events.   Objective: Current vital signs: BP 105/47 mmHg  Pulse 73  Temp(Src) 98.9 F (37.2 C) (Oral)  Resp 16  Ht 4\' 11"  (1.499 m)  Wt 53.298 kg (117 lb 8 oz)  BMI 23.72 kg/m2  SpO2 98% Vital signs in last 24 hours: Temp:  [98.6 F (37 C)-100.8 F (38.2 C)] 98.9 F (37.2 C) (03/16 0921) Pulse Rate:  [73-78] 73 (03/16 0921) Resp:  [14-18] 16 (03/16 0921) BP: (104-150)/(47-63) 105/47 mmHg (03/16 0921) SpO2:  [96 %-100 %] 98 % (03/16 0921) Weight:  [53.298 kg (117 lb 8 oz)] 53.298 kg (117 lb 8 oz) (03/16 0500)  Intake/Output from previous day: 03/15 0701 - 03/16 0700 In: 720 [P.O.:720] Out: -  Intake/Output this shift:   Nutritional status: Diet heart healthy/carb modified  Neurologic Exam: Mental Status: Alert, oriented,, intermittently following commands, no aphasia noted Cranial Nerves: II: visual fields grossly normal, pupils equal, round reactive to light III,IV, VI: ptosis not present, extra-ocular motions intact bilaterally V,VII: smile symmetric, facial light touch sensation normal bilaterally Motor: Moves all extremities symmetrically and against light resistance  Lab Results: Basic Metabolic Panel:  Recent Labs Lab 01/25/15 1656 01/25/15 1701 01/26/15 0610  NA 138 142 141  K 3.4* 3.5 3.3*  CL 106 106 107  CO2 22  --  28  GLUCOSE 109* 109* 97  BUN 15 16 11   CREATININE 1.05 1.00 0.94  CALCIUM 10.2  --  9.8    Liver Function Tests:  Recent Labs Lab 01/25/15 1656  AST 15  ALT 9  ALKPHOS 32*  BILITOT 0.3  PROT 7.0  ALBUMIN 3.6   No results for input(s): LIPASE, AMYLASE in the last 168 hours.  Recent Labs Lab 01/26/15 0014  AMMONIA 36*    CBC:  Recent Labs Lab 01/25/15 1656 01/25/15 1701 01/26/15 0610  WBC 4.3  --  3.6*  NEUTROABS 2.4  --   --   HGB 9.3* 11.2* 9.3*  HCT 29.4* 33.0* 29.3*  MCV 70.7*  --  70.6*  PLT 163   --  137*    Cardiac Enzymes: No results for input(s): CKTOTAL, CKMB, CKMBINDEX, TROPONINI in the last 168 hours.  Lipid Panel: No results for input(s): CHOL, TRIG, HDL, CHOLHDL, VLDL, LDLCALC in the last 168 hours.  CBG:  Recent Labs Lab 01/26/15 0703 01/26/15 1157 01/26/15 1617 01/26/15 2200 01/27/15 0707  GLUCAP 111* 116* 133* 114* 119*    Microbiology: Results for orders placed or performed in visit on 11/26/14  Urine culture     Status: None   Collection Time: 11/26/14  5:08 PM  Result Value Ref Range Status   Culture ESCHERICHIA COLI  Final   Colony Count >=100,000 COLONIES/ML  Final   Organism ID, Bacteria ESCHERICHIA COLI  Final      Susceptibility   Escherichia coli -  (no method available)    AMPICILLIN >=32 Resistant     AMOX/CLAVULANIC >=32 Resistant     AMPICILLIN/SULBACTAM 16 Intermediate     PIP/TAZO <=4 Sensitive     IMIPENEM <=0.25 Sensitive     CEFAZOLIN 8 Sensitive     CEFTRIAXONE <=1 Sensitive     CEFTAZIDIME <=1 Sensitive     CEFEPIME <=1 Sensitive     GENTAMICIN <=1 Sensitive     TOBRAMYCIN <=1 Sensitive     CIPROFLOXACIN <=0.25 Sensitive     LEVOFLOXACIN <=0.12 Sensitive     NITROFURANTOIN <=  16 Sensitive     TRIMETH/SULFA <=20 Sensitive     Coagulation Studies:  Recent Labs  01/25/15 1656  LABPROT 14.3  INR 1.10    Imaging: X-ray Chest Pa And Lateral  01/26/2015   CLINICAL DATA:  Cough and right neck pain  EXAM: CHEST  2 VIEW  COMPARISON:  09/21/2014  FINDINGS: Generous heart size without interval change. There is mild aortic tortuosity which is also stable. The hila are negative. There is no edema, consolidation, effusion, or pneumothorax.  IMPRESSION: No active cardiopulmonary disease.   Electronically Signed   By: Monte Fantasia M.D.   On: 01/26/2015 10:52   Ct Head Wo Contrast  01/25/2015   CLINICAL DATA:  Code stroke.  Confusion.  Bilateral weakness.  EXAM: CT HEAD WITHOUT CONTRAST  TECHNIQUE: Contiguous axial images were  obtained from the base of the skull through the vertex without intravenous contrast.  COMPARISON:  09/19/2014  FINDINGS: There is atrophy and chronic small vessel disease changes. No acute intracranial abnormality. Specifically, no hemorrhage, hydrocephalus, mass lesion, acute infarction, or significant intracranial injury. No acute calvarial abnormality.  IMPRESSION: No acute intracranial abnormality.  Atrophy, chronic microvascular disease.  Critical Value/emergent results were called by telephone at the time of interpretation on 01/25/2015 at 5:21 pm to Dr. Debby Freiberg , who verbally acknowledged these results.   Electronically Signed   By: Rolm Baptise M.D.   On: 01/25/2015 17:22   Mr Brain Wo Contrast  01/25/2015   CLINICAL DATA:  Code stroke. Last seen normal 1600 hours today. Altered mental status. History of breast cancer. Initial encounter.  EXAM: MRI HEAD WITHOUT CONTRAST  TECHNIQUE: Multiplanar, multiecho pulse sequences of the brain and surrounding structures were obtained without intravenous contrast.  COMPARISON:  CT head 01/25/2015 earlier today.  FINDINGS: No evidence for acute infarction, hemorrhage, mass lesion, hydrocephalus, or extra-axial fluid. Generalized atrophy. Moderately advanced chronic microvascular ischemic change involving the periventricular and subcortical white matter, with lesser changes in the pons and cerebellum. Remote lacunar infarct in the RIGHT lentiform nucleus and LEFT thalamus. Flow voids are maintained with diminutive RIGHT vertebral. Tiny foci of susceptibility in the LEFT frontal white matter likely representing hypertensive microbleeds or post treatment effect. No acute osseous findings. No midline abnormality. BILATERAL cataract extraction. No sinus air-fluid level or significant mastoid fluid.  IMPRESSION: Chronic changes as described.  No intracranial mass lesion or evidence for acute infarction.  Good general agreement with prior CT.   Electronically Signed    By: Rolla Flatten M.D.   On: 01/25/2015 18:26    Medications:  Scheduled: . amLODipine  5 mg Oral Daily  . aspirin  81 mg Oral Daily  . carvedilol  12.5 mg Oral BID WC  . clopidogrel  75 mg Oral Daily  . colchicine  0.3 mg Oral Daily  . diclofenac sodium  2 g Topical QID  . ferrous sulfate  325 mg Oral BID WC  . furosemide  20 mg Oral Daily  . gabapentin  300 mg Oral BID  . heparin  5,000 Units Subcutaneous 3 times per day  . insulin aspart  0-9 Units Subcutaneous TID WC  . losartan  100 mg Oral QHS  . metFORMIN  1,000 mg Oral BID WC  . rosuvastatin  20 mg Oral QHS  . sodium chloride  3 mL Intravenous Q12H  . tamoxifen  20 mg Oral Daily   EEG: pending  Assessment/Plan: 81 y.o. female hx of hypertension, CAD, DM presenting with acute onset  of altered mental status and question of left facial droop. No further events while in hospital. MRI brain negative for acute infarct. EEG pending. CT angio head and neck pending.   -EEG done awaiting reading --Continue ASA 81 mg and Plavix 75mg  daily --CTA head and neck pending --will continue to follow.     Etta Quill PA-C [facility] [phone]  01/27/2015, 9:36 AM  Patient seen and evaluated. Agree with above assessment and plan. Mental status markedly improved compared to initial ED evaluation. Imaging in the past shows bilateral vertebral stenosis. Patient refusing repeat imaging with contrast. Would continue on dual anti-platelet therapy at this time. EEG read pending, if unremarkable no further neurological workup indicated at this time. She will need follow up with outpatient neurology.  Jim Like, DO [facility] [phone]  If 7pm- 7am, please page neurology on call as listed in [place].

## 2015-01-27 NOTE — Progress Notes (Signed)
Family Medicine Teaching Service Daily Progress Note Intern Pager: [phone]  [name] Medical record number: [id number] Date of birth: 05-Apr-1924 Age: 81 y.o. Gender: female  Primary Care Provider: [name], MD Consultants: Neuro, Cards Code Status: DNR  Pt Overview and Major Events to Date:  3/15: Admitted for AMS   Assessment and Plan: [name] is a 81 y.o. female presenting with acute onset altered mental status. PMH is significant for HTN, CAD, PAD, DM, hyperlipidemia, tobacco abuse, asthma, GERD, hx of colon and breast cancer, and h/o MI.  #AMS: Symptoms largely resolved on presentation to hospital. Head CT and MRI negative for acute change, EKG unchanged, troponin negative. No clear description of focal neuro deficit. Did have h/o CT angio neck 09/2013 with diffuse atherosclerotic disease. No change in med/PO/symptoms prior to this occurring today. UDS unremarkable. Patient returned back to baseline without deficits. Top diagnosis at this time is hypertensive encephalopathy vs TIA. -continue on telemtry - f/u neuro recs including EEG interpretation - ordered CTA of neck to further access posterior circulation atherosclerotic disease - patient declined  Instead will order carotid dopplers - Echocardiogram pending  -cardiology consulted to help with further syncopal work-up - suggested plavix addition -PT consulted and recommended HH PT -order placed  # HTN/hypertensive emergency/hypertensive encephalopathy: Patient with hypertensive emergency with possible encephalopathy. BP elevated to 220s SBP in ED.  Pressures continue to be elevated with pulse pressures >110. Soft pressures today. Likely hypertensive emergency on admission due to medication none adherence.  - Continue home coreg, norvasc, losartan, and lasix   -norvasc decreased to 5mg  and Lasix dose decreased and changed to prn - Monitor BP with gradual lowering - discontinued tamoxifen and  discussed with patient.  -cardiology consulted; appreciate recs  #CHF: No evidence of exacerbation. Minimal volume overload appreciated with trace edema, some JVD, and crackles on bilateral lung bases. Last echo in March 2015 showed normal EF and grade 1 diastolic dysfunction. No shortness of breath. - 2 view CXR no acute process - continue meds as above -BNP elevated 179 - Monitor volume status  - strict I/Os  #Cough: productive, non-discolored phlegm produced.  -CXR ordered unremarkable -Tessalon prn for cough  #UTI: Asymptomatic. Many bacteria on UA. No nitrites and small leukocytes. -will not treat at this time -continue to monitor; treat if symtpomatic  #Anemia: Hbg 9.3. MCV 70. H/o iron deficiency not currently on iron. Stable, baseline 9-10.  -trend CBC  -start patient on iron  #DM: A1c 6.7 this month - Continue home metformin - sensitive SSI  #CAD/PAD: - continue home ASA and crestor  FEN/GI: heart healthy/carb mod diet, SLIV, home H2 blocker Prophylaxis: SQ heparin  Disposition: Continue current management; possible home today after imaging.  Subjective:  Patient doing well this morning. She states that she is ready to go home and does not want any more procedures. She declined taking the dye for her CT imaging because it makes her feel "weird".  She adamantly refuses. Patient also declined wanting to take Plavix because she was on it approximately 5 years ago and was told to discontinue. She denies any bleeding on medication but can not recall why it was stopped. She does not want to take.  Objective: Temp:  [98.6 F (37 C)-100.8 F (38.2 C)] 100.2 F (37.9 C) (03/16 0621) Pulse Rate:  [73-78] 75 (03/16 0621) Resp:  [14-18] 16 (03/16 0621) BP: (104-150)/(47-63) 139/49 mmHg (03/16 0621) SpO2:  [96 %-100 %] 98 % (03/16 0621)  Weight:  [117 lb 8 oz (53.298 kg)] 117 lb 8 oz (53.298 kg) (03/16 0500) Physical Exam: General: frail elderly woman, lying in bed, NAD,  pleasant HEENT: MMM, NCAT, EOMI, arcus senilis present bilaterally Cardiovascular: RRR, no m/r/g Respiratory: normal WOB, CTAB, No wheezes or crackles Abdomen: soft, NTND, +BS Extremities: WWP, LLE with limited ROM at hip and knee joints with no tenderness greater trochanter.  Skin: no rashes, thin  Neuro: alert and oriented, no focal deficits  Laboratory: Results for orders placed or performed during the hospital encounter of 01/25/15 (from the past 24 hour(s))  Glucose, capillary     Status: Abnormal   Collection Time: 01/26/15 11:57 AM  Result Value Ref Range   Glucose-Capillary 116 (H) 70 - 99 mg/dL  Glucose, capillary     Status: Abnormal   Collection Time: 01/26/15  4:17 PM  Result Value Ref Range   Glucose-Capillary 133 (H) 70 - 99 mg/dL  Glucose, capillary     Status: Abnormal   Collection Time: 01/26/15 10:00 PM  Result Value Ref Range   Glucose-Capillary 114 (H) 70 - 99 mg/dL   Comment 1 Notify RN    Comment 2 Documented in Char   Glucose, capillary     Status: Abnormal   Collection Time: 01/27/15  7:07 AM  Result Value Ref Range   Glucose-Capillary 119 (H) 70 - 99 mg/dL   Comment 1 Notify RN    Comment 2 Documented in Char    Imaging/Diagnostic Tests: Ct Head Wo Contrast 01/25/2015  IMPRESSION: No acute intracranial abnormality.  Atrophy, chronic microvascular disease.    Mr Brain Wo Contrast 01/25/2015  IMPRESSION: Chronic changes as described.  No intracranial mass lesion or evidence for acute infarction.  Good general agreement with prior CT.    Katheren Shams, DO 01/27/2015, 8:27 AM PGY-1, [place] Intern pager: [phone], text pages welcome

## 2015-01-27 NOTE — Progress Notes (Signed)
UR completed 

## 2015-01-28 DIAGNOSIS — I10 Essential (primary) hypertension: Secondary | ICD-10-CM | POA: Diagnosis not present

## 2015-01-28 DIAGNOSIS — I674 Hypertensive encephalopathy: Secondary | ICD-10-CM | POA: Diagnosis not present

## 2015-01-28 DIAGNOSIS — I2510 Atherosclerotic heart disease of native coronary artery without angina pectoris: Secondary | ICD-10-CM | POA: Diagnosis not present

## 2015-01-28 DIAGNOSIS — I739 Peripheral vascular disease, unspecified: Secondary | ICD-10-CM | POA: Diagnosis not present

## 2015-01-28 DIAGNOSIS — E119 Type 2 diabetes mellitus without complications: Secondary | ICD-10-CM | POA: Diagnosis not present

## 2015-01-28 DIAGNOSIS — R55 Syncope and collapse: Secondary | ICD-10-CM | POA: Diagnosis not present

## 2015-01-28 LAB — GLUCOSE, CAPILLARY
Glucose-Capillary: 116 mg/dL — ABNORMAL HIGH (ref 70–99)
Glucose-Capillary: 127 mg/dL — ABNORMAL HIGH (ref 70–99)

## 2015-01-28 MED ORDER — LOSARTAN POTASSIUM 50 MG PO TABS: 50.0000 mg | ORAL_TABLET | Freq: Every day | ORAL | Status: DC

## 2015-01-28 NOTE — Progress Notes (Signed)
*  PRELIMINARY RESULTS* Vascular Ultrasound Carotid Duplex (Doppler) has been completed. Findings suggest 1-39% internal carotid artery stenosis bilaterally. Vertebral arteries are patent with antegrade flow.  01/28/2015 2:17 PM Maudry Mayhew, RVT, RDCS, RDMS

## 2015-01-28 NOTE — Progress Notes (Signed)
Subjective:  Doing well and denies any chest pain or shortness of breath either to go home.  Objective:  Vital Signs in the last 24 hours: Temp:  [98 F (36.7 C)-98.5 F (36.9 C)] 98.2 F (36.8 C) (03/17 0934) Pulse Rate:  [69-74] 69 (03/17 0934) Resp:  [18] 18 (03/17 0934) BP: (92-153)/(40-62) 152/45 mmHg (03/17 0934) SpO2:  [90 %-100 %] 100 % (03/17 0934) Weight:  [52.799 kg (116 lb 6.4 oz)] 52.799 kg (116 lb 6.4 oz) (03/17 0500)  Intake/Output from previous day:   Intake/Output from this shift:    Physical Exam: Neck: no adenopathy, no carotid bruit, no JVD and supple, symmetrical, trachea midline Lungs: clear to auscultation bilaterally Heart: regular rate and rhythm, S1, S2 normal and Soft systolic murmur noted no S3 gallop Abdomen: soft, non-tender; bowel sounds normal; no masses,  no organomegaly Extremities: extremities normal, atraumatic, no cyanosis or edema  Lab Results:  Recent Labs  01/26/15 0610 01/27/15 0830  WBC 3.6* 4.8  HGB 9.3* 9.4*  PLT 137* 140*    Recent Labs  01/26/15 0610 01/27/15 0830  NA 141 137  K 3.3* 4.0  CL 107 106  CO2 28 22  GLUCOSE 97 132*  BUN 11 18  CREATININE 0.94 1.17*   No results for input(s): TROPONINI in the last 72 hours.  Invalid input(s): CK, MB Hepatic Function Panel  Recent Labs  01/25/15 1656  PROT 7.0  ALBUMIN 3.6  AST 15  ALT 9  ALKPHOS 32*  BILITOT 0.3   No results for input(s): CHOL in the last 72 hours. No results for input(s): PROTIME in the last 72 hours.  Imaging: Imaging results have been reviewed and No results found.  Cardiac Studies:  Assessment/Plan:  Status post syncope rule out TIA rule out seizures rule out cardiac arrhythmias Coronary artery disease history of non-Q-wave MI in the past Multivessel CAD status post PCI to LAD in the past History of ischemic cardiomyopathy improved after PCI to LAD Hypertension Diabetes mellitus Peripheral artery disease History of tobacco  abuse History of CVA of colon and CL of breast. Status post hypertensive encephalopathy Anemia Plan Okay to discharge from cardiac point of view Follow-up with me as outpatient in 2 weeks  LOS: 3 days    [name] N 01/28/2015, 11:49 AM

## 2015-01-28 NOTE — Progress Notes (Signed)
Met with patient and daughter to review discharge instructions. Discussed the importance of follow up appt to be scheduled, as well as adherence to medication regimen. Advised them that rx called into [facility] on [address]. Pt was escorted to lobby by nurse tech and transported to her home in  by daughter.  

## 2015-01-28 NOTE — Progress Notes (Signed)
UR completed 

## 2015-01-28 NOTE — Progress Notes (Signed)
Physical Therapy Treatment Patient Details Name: [name] MRN: [id number] DOB: 1924-05-13 Today's Date: 01/28/2015    History of Present Illness 81 year old female with past medical history significant for coronary artery disease history of non-Q-wave myocardial infarction in 2008 subsequently noted to have three-vessel CAD felt not the candidate for CABG and was treated percutaneously had PTCA stenting to proximal LAD. RCA and left circumflex were treated medically, history of congestive heart failure secondary to depressed LV systolic function which has markedly improved by recent echo, hypertension, diabetes mellitus, hypercholesteremia, chronic anemia, history of CVA of breast and C of colon, degenerative joint disease, was admitted yesterday with acute onset of altered mental status and questionable left facial droop followed by sudden syncopal episode. Patient was nonverbal and not moving her extremities for a few seconds was noted to be hypertensive with blood pressure of 220/90. Patient denies any palpitations prior to syncopal episode and no seizure activity was noted    PT Comments    Patient very sweet and motivated. Has assistance at home as needed. Patient does not want to use RW despite education and stated that daughter will assist her at home.  She is set up with HHPT. Continue with current POC.   Follow Up Recommendations  Home health PT     Equipment Recommendations  None recommended by PT    Recommendations for Other Services       Precautions / Restrictions Precautions Precautions: Fall Restrictions Weight Bearing Restrictions: No    Mobility  Bed Mobility               General bed mobility comments: Up in recliner upon entering  Transfers Overall transfer level: Needs assistance Equipment used: Rolling walker (2 wheeled) Transfers: Sit to/from Stand Sit to Stand: Supervision         General transfer comment: Cues for hand  placement  Ambulation/Gait Ambulation/Gait assistance: Min assist Ambulation Distance (Feet): 150 Feet Assistive device: 1 person hand held assist Gait Pattern/deviations: Step-through pattern;Decreased stride length     General Gait Details: Attempted without RW as patient said she would not use at home. Patient has a cane she uses at home and otherwise stated that daughter holds her hand. Min A for balance    Stairs   Stairs assistance: Min guard Stair Management: Step to pattern;One rail Right;Sideways Number of Stairs: 4 General stair comments: Cues for safe sequencing and technique with use of single handrail ascending/descending sideways for safety.   Wheelchair Mobility    Modified Rankin (Stroke Patients Only)       Balance                                    Cognition Arousal/Alertness: Awake/alert Behavior During Therapy: WFL for tasks assessed/performed Overall Cognitive Status: Within Functional Limits for tasks assessed                      Exercises      General Comments        Pertinent Vitals/Pain Pain Assessment: No/denies pain    Home Living                      Prior Function            PT Goals (current goals can now be found in the care plan section) Progress towards PT goals: Progressing toward goals  Frequency  Min 3X/week    PT Plan Current plan remains appropriate    Co-evaluation             End of Session Equipment Utilized During Treatment: Gait belt Activity Tolerance: Patient tolerated treatment well Patient left: in chair;with call bell/phone within reach     Time: 7356-7014 PT Time Calculation (min) (ACUTE ONLY): 19 min  Charges:  $Gait Training: 8-22 mins                    G Codes:      Jacqualyn Posey 01/28/2015, 11:43 AM 01/28/2015 Jacqualyn Posey PTA [phone] pager [phone] office

## 2015-02-01 ENCOUNTER — Telehealth: Payer: Self-pay | Admitting: Family Medicine

## 2015-02-01 ENCOUNTER — Telehealth: Payer: Self-pay | Admitting: *Deleted

## 2015-02-01 NOTE — Telephone Encounter (Signed)
Patient's daughter called to [name] was hospitalized with stroke like symptoms.  When she was discharged she was instructed not to take tamoxifen any longer.  Advised patient to follow those orders unless she is instructed to restart it by Dr. Alvy Bimler.  She agreed to hold the drug.

## 2015-02-01 NOTE — Telephone Encounter (Signed)
Wants to know who stopped MS Spear for daily cancer pil Please advise

## 2015-02-02 ENCOUNTER — Telehealth: Payer: Self-pay | Admitting: Family Medicine

## 2015-02-02 NOTE — Telephone Encounter (Signed)
Spoke with patient and her daughter. Patient states that every morning when she takes her iron pills she gets sick to her stomach and has nausea and vomiting. Patient states the nausea lasts for hours after taking medication. Wants to know what she should do, will forward to PCP.

## 2015-02-02 NOTE — Telephone Encounter (Signed)
Daughter called and would like to speak to the doctor concerning her mother and how she is feeling. [name]

## 2015-02-03 ENCOUNTER — Telehealth: Payer: Self-pay | Admitting: *Deleted

## 2015-02-03 ENCOUNTER — Other Ambulatory Visit: Payer: Self-pay | Admitting: *Deleted

## 2015-02-03 DIAGNOSIS — M5030 Other cervical disc degeneration, unspecified cervical region: Secondary | ICD-10-CM | POA: Diagnosis not present

## 2015-02-03 DIAGNOSIS — I5022 Chronic systolic (congestive) heart failure: Secondary | ICD-10-CM | POA: Diagnosis not present

## 2015-02-03 DIAGNOSIS — I2510 Atherosclerotic heart disease of native coronary artery without angina pectoris: Secondary | ICD-10-CM | POA: Diagnosis not present

## 2015-02-03 DIAGNOSIS — I10 Essential (primary) hypertension: Secondary | ICD-10-CM | POA: Diagnosis not present

## 2015-02-03 DIAGNOSIS — E119 Type 2 diabetes mellitus without complications: Secondary | ICD-10-CM | POA: Diagnosis not present

## 2015-02-03 DIAGNOSIS — M546 Pain in thoracic spine: Secondary | ICD-10-CM

## 2015-02-03 DIAGNOSIS — J45909 Unspecified asthma, uncomplicated: Secondary | ICD-10-CM | POA: Diagnosis not present

## 2015-02-03 NOTE — Telephone Encounter (Signed)
[name] from [facility] calls and wants MD to be aware that they are going out to visit Mrs. Mcmackin today.  She requested that they not come out until today. Yamileth Hayse, [name]

## 2015-02-03 NOTE — Telephone Encounter (Signed)
Called and spoke to Ms,. [name]. Patient has been experiencing nausea/vomiting over the last few days and feel it could be secondary to iron. She also endorses feeling very weak.   I instructed the patient to stop taking the iron for 1 week to see if this is the culprit. If she continues to feel badly in the next day or two and/or is unable to keep anything down, I instructed the patient to come in to be evaluated in clinic. Patient has an appt with me next Tuesday, at which time we can consider changing the timing of the medication if it is the culprit.  [name], MD [facility] Family Medicine Resident  02/03/2015, 10:01 AM

## 2015-02-04 ENCOUNTER — Telehealth: Payer: Self-pay | Admitting: Family Medicine

## 2015-02-04 MED ORDER — TRAMADOL HCL 50 MG PO TABS: 50.0000 mg | ORAL_TABLET | Freq: Two times a day (BID) | ORAL | Status: DC | PRN

## 2015-02-04 NOTE — Telephone Encounter (Signed)
Pt is calling and requesting her refill on Tramadol be left up front today since we are closed tomorrow. jw

## 2015-02-04 NOTE — Telephone Encounter (Signed)
Please let [name] know she has a Rx waiting at the front desk.  Additionally please advise her she should make appts with me when she is in pain so I can appropriately assess her pain.  Thanks, [name], MD [facility] Family Medicine Resident  02/04/2015, 11:22 AM

## 2015-02-04 NOTE — Telephone Encounter (Signed)
Pt informed. Declan Mier, CMA  

## 2015-02-04 NOTE — Telephone Encounter (Signed)
Requesting orders to continue home health, call so she can give specifics on what she needs.

## 2015-02-08 ENCOUNTER — Other Ambulatory Visit: Payer: Self-pay | Admitting: *Deleted

## 2015-02-08 MED ORDER — ROSUVASTATIN CALCIUM 20 MG PO TABS: 20.0000 mg | ORAL_TABLET | Freq: Every day | ORAL | Status: DC

## 2015-02-08 NOTE — Telephone Encounter (Signed)
Call returned and orders provided.  [name], MD [facility] Family Medicine Resident  02/08/2015, 8:28 AM

## 2015-02-09 ENCOUNTER — Encounter: Payer: Self-pay | Admitting: Family Medicine

## 2015-02-09 ENCOUNTER — Ambulatory Visit (INDEPENDENT_AMBULATORY_CARE_PROVIDER_SITE_OTHER): Payer: Medicare Other | Admitting: Family Medicine

## 2015-02-09 VITALS — BP 170/66 | HR 74 | Temp 98.0°F | Ht 59.0 in | Wt 112.8 lb

## 2015-02-09 DIAGNOSIS — R109 Unspecified abdominal pain: Secondary | ICD-10-CM

## 2015-02-09 DIAGNOSIS — I10 Essential (primary) hypertension: Secondary | ICD-10-CM | POA: Diagnosis not present

## 2015-02-09 DIAGNOSIS — J309 Allergic rhinitis, unspecified: Secondary | ICD-10-CM

## 2015-02-09 DIAGNOSIS — J302 Other seasonal allergic rhinitis: Secondary | ICD-10-CM

## 2015-02-09 DIAGNOSIS — E118 Type 2 diabetes mellitus with unspecified complications: Secondary | ICD-10-CM

## 2015-02-09 DIAGNOSIS — R112 Nausea with vomiting, unspecified: Secondary | ICD-10-CM | POA: Diagnosis not present

## 2015-02-09 LAB — BASIC METABOLIC PANEL
BUN: 21 mg/dL (ref 6–23)
CO2: 25 mEq/L (ref 19–32)
Calcium: 10.9 mg/dL — ABNORMAL HIGH (ref 8.4–10.5)
Chloride: 104 mEq/L (ref 96–112)
Creat: 1.07 mg/dL (ref 0.50–1.10)
GLUCOSE: 132 mg/dL — AB (ref 70–99)
Potassium: 4.3 mEq/L (ref 3.5–5.3)
SODIUM: 140 meq/L (ref 135–145)

## 2015-02-09 LAB — GLUCOSE, CAPILLARY: Glucose-Capillary: 134 mg/dL — ABNORMAL HIGH (ref 70–99)

## 2015-02-09 MED ORDER — ONDANSETRON 4 MG PO TBDP: 4.0000 mg | ORAL_TABLET | Freq: Three times a day (TID) | ORAL | Status: DC | PRN

## 2015-02-09 MED ORDER — LORATADINE 10 MG PO TABS: 5.0000 mg | ORAL_TABLET | Freq: Every day | ORAL | Status: DC

## 2015-02-09 NOTE — Patient Instructions (Signed)
It was nice to see you again If your vomiting does not improve and resolve with Zofran, please seek medical care. Please call us or go to the ED if you not that you are unable to keep anything down by mouth. Please seek medical care if your symptoms change, you develop fever, painful urination, or confusion. Please follow up in clinic in 1 week or sooner if needed.

## 2015-02-09 NOTE — Progress Notes (Addendum)
Patient ID: [name], female   DOB: 1924/03/18, 81 y.o.   MRN: [id number]    Subjective: [id number] f/u for AMS of unsure etiology and HTN (hypertensive urgency?); new onset dizziness, head fullness, and vomiting.  HPI: Patient is a 81 y.o. female with a past medical history of breast CA, HTN, PAD, memory deficits, CHF, arthritis with generalized pain, and T2DM among other things presenting to clinic today for a hospital f/u with numerous complaints.  1) Hospital f/u: Patient presented with near syncope, dizziness, and was found to be unresponsive by EMS. Her daughter [name] felt like she was at her baseline today. At that time, SBPs in the 220s despite taking all her medications. Head CT/MRI negative for acute changes, EKG and troponin negative. Neurology consulted and did not feel it was neurologic in nature. Echo revealed grade 1 diastolic dysfunction. Plavix was added to her regimen.   2) HTN: Uncontrolled, labile. On discharge, her regimen was significantly decreased due to concerns of non-compliance (as the patient's BP dropped significantly when her home regimen was continued in the hospital. Coreg was discontinued, losartan was decreased to 50mg , and amlodipine was decreased to 5mg  daily. The patient states that she took all her medications this AM (neither she nor her daughter can tell me which ones specifically). Her BPs are consistently 170s/60s in the office today, however she repeated vomiting/retching in clinic.   3) Head fullness: Since yesterday, her head feels full and heavy (having a difficult time keeping it up). When describing the fullness, she grabs the crown of her head.  She feels dizzy like she wants to fall however it doesn't feel like the room or her are spinning which began last night. The dizziness is no worse with standing or positional changes and she continues to have the head fullness and dizziness while sitting.   No headaches, ear ache, problems with hearing,  vision changes. She does endorse rhinorrhea, watery eyes, itchy eyes, heaviness in face, post-nasal drip, coughing. Patient has a h/o allergies and hasn't been taking any medication. Denies facial pain, sneezing, or sore throat.  4) Left sided flank pain: patient states this is new and began on Thursday  and that she's never hand any pain like this before, however I've seen her in the clinic before this same pain (negative U/A at that time and her symptoms resolved spontaneously). She noticed the dull pain on Thursday and states that is a constant pain below her rib cage and towards her back. Hurts mostly with movement and nothing relives the pain. She denies any h/o trauma/injury. Stable urinary frequency due to Lasix. Denies urinary urgency, dysuria, fever/chills, suprapubic pain, gross hematuria, or change in the color/smell of her urine. She denies any chest pain, SOB, or chest pain.   5) Vomiting: Began today while in clinic.  She was having vomiting each morning with iron supplementation and we stopped her iron pill on 3/24 and this improved almost immediately until now. Her vomiting began again this AM. She is unsure if this is related to the left sided flank pain. No pain with touching the overlying skin. No constipation, diarrhea, hematemesis, hematochezia, melana, or no sick contacts. She did take all her medication this AM without eating anything.   Social History: Patient uses snuff   ROS: All other systems reviewed and are negative.  Past Medical History Patient Active Problem List   Diagnosis Date Noted  . Seasonal allergies 02/10/2015  . Nausea with vomiting 02/10/2015  . Flank pain  02/10/2015  . Stroke-like symptoms   . Stroke-like episode 01/25/2015  . Syncope and collapse 01/25/2015  . Right leg pain   . Accelerated hypertension   . Hypertensive emergency 09/19/2014  . Dizziness 09/19/2014  . Anemia of chronic illness 08/19/2014  . Acute superficial venous thrombosis of  right lower extremity 08/19/2014  . Right ankle pain 08/10/2014  . Hypercalcemia 06/09/2014  . Breast cancer, left breast 06/09/2014  . Breast cancer, right breast 06/09/2014  . Edema of left lower extremity 03/30/2014  . PAD (peripheral artery disease) 03/30/2014  . Colon cancer 12/11/2013  . Neck pain 10/22/2013  . Light headed 06/16/2013  . History of fall 05/15/2013  . Right thigh pain 05/15/2013  . Memory deficit 05/15/2013  . Bilateral leg pain 03/07/2013  . Trigger thumb of right hand 03/07/2013  . Lower extremity edema 02/19/2013  . Weakness generalized 01/22/2013  . Stricture and stenosis of esophagus 10/27/2012  . Poor social situation 09/13/2012  . Low back pain 08/07/2012  . Musculoskeletal chest pain 07/10/2012  . Thoracic back pain 05/24/2012  . Productive cough 04/30/2012  . Weight loss 04/03/2012  . Insomnia 09/25/2011  . Tobacco abuse 06/02/2011  . History of breast cancer in female, bilateral.  Mastectomies 10/24/2010. 04/26/2011  . Hiatal hernia 03/22/2011  . Colon polyp 03/22/2011  . Poor circulation 03/22/2011  . 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 NEUROPATHY 08/25/2009  . ANEMIA-IRON DEFICIENCY 04/29/2007  . HLD (hyperlipidemia) 01/29/2007  . Diabetes type 2, controlled 01/10/2007  . HYPERTENSION, BENIGN SYSTEMIC 01/10/2007  . Coronary atherosclerosis 01/10/2007  . Chronic systolic heart failure 55/73/2202  . Asthma 01/10/2007  . Reflux esophagitis 01/10/2007  . ARTHRITIS 01/10/2007    Medications- reviewed and updated Current Outpatient Prescriptions  Medication Sig Dispense Refill  . albuterol (PROVENTIL HFA;VENTOLIN HFA) 108 (90 BASE) MCG/ACT inhaler Inhale 2 puffs into the lungs every 6 (six) hours as needed. For shortness of breath 1 Inhaler 6  . amLODipine (NORVASC) 5 MG tablet Take 1 tablet (5 mg total) by mouth daily. 30 tablet 0  . aspirin (ASPIRIN CHILDRENS) 81 MG chewable tablet Chew 81 mg by mouth daily.      . clopidogrel  (PLAVIX) 75 MG tablet Take 1 tablet (75 mg total) by mouth daily. 30 tablet 0  . cyclobenzaprine (FLEXERIL) 5 MG tablet Take 0.5-1 tablets (2.5-5 mg total) by mouth 3 (three) times daily as needed for muscle spasms. 30 tablet 1  . diclofenac sodium (VOLTAREN) 1 % GEL Apply 2 g topically 4 (four) times daily. 100 g 3  . ferrous sulfate 325 (65 FE) MG tablet Take 1 tablet (325 mg total) by mouth daily with breakfast. 30 tablet 0  . furosemide (LASIX) 20 MG tablet Take 0.5 tablets (10 mg total) by mouth daily as needed for fluid or edema. 30 tablet 0  . gabapentin (NEURONTIN) 300 MG capsule Take 300 mg by mouth 2 (two) times daily.     Marland Kitchen glucose blood test strip Use as instructed Dx. Code 250.02 100 each 12  . loratadine (CLARITIN) 10 MG tablet Take 0.5 tablets (5 mg total) by mouth daily. 30 tablet 1  . losartan (COZAAR) 50 MG tablet Take 1 tablet (50 mg total) by mouth at bedtime. 30 tablet 0  . magnesium hydroxide (MILK OF MAGNESIA) 400 MG/5ML suspension Take 30 mLs by mouth daily as needed for mild constipation.    . metFORMIN (GLUCOPHAGE) 500 MG tablet TAKE 2 TABLETS (1,000 MG TOTAL) BY MOUTH 2 (TWO) TIMES  DAILY WITH A MEAL. 120 tablet 1  . mirtazapine (REMERON) 15 MG tablet Take 15 mg by mouth at bedtime as needed (sleep).    . ondansetron (ZOFRAN ODT) 4 MG disintegrating tablet Take 1 tablet (4 mg total) by mouth every 8 (eight) hours as needed for nausea or vomiting. 20 tablet 0  . ondansetron (ZOFRAN) 4 MG tablet Take 1 tablet (4 mg total) by mouth every 8 (eight) hours as needed for nausea or vomiting. 20 tablet 0  . ranitidine (ZANTAC) 150 MG capsule TAKE ONE CAPSULE BY MOUTH TWICE A DAY AS NEEDED FOR HEARTBURN 60 capsule 3  . rosuvastatin (CRESTOR) 20 MG tablet Take 1 tablet (20 mg total) by mouth at bedtime. 30 tablet 2  . traMADol (ULTRAM) 50 MG tablet Take 1 tablet (50 mg total) by mouth every 12 (twelve) hours as needed for severe pain. 30 tablet 0  . [DISCONTINUED] cetirizine (ZYRTEC)  5 MG tablet Take 1 tablet (5 mg total) by mouth daily. Take at night time as it can cause some drowsiness 30 tablet 4   No current facility-administered medications for this visit.    Objective: Office vital signs reviewed. BP 170/66 mmHg  Pulse 74  Temp(Src) 98 F (36.7 C) (Oral)  Ht 4\' 11"  (1.499 m)  Wt 112 lb 12.8 oz (51.166 kg)  BMI 22.77 kg/m2   Physical Examination:  General: Awake, alert, well- nourished, actively vomiting small amounts in clinic. ENMT:  Tenderness over the maxillary and frontal sinuses. TMs intact, normal light reflex, no erythema, no bulging of the TMs, however erythema of the ear canals bilaterally. Nasal turbinates moist with clear discharge. MMM, Oropharynx clear without erythema or tonsillar exudate/hypertrophy. No LAD noted. Eyes: Conjunctiva non-injected. PERRL.  Cardio: RRR, I/VI systolic murmur noted. No edema noted. Pulm: No increased WOB.  CTAB, without wheezes, rhonchi or crackles noted.  GI: soft, non-distended,+BS in all 4 quadrants, minimally tender in the LLQ and flank. no hepatomegaly, no splenomegaly. No CVA tenderness.  MSK: Slow gait requiring a moderate amount of assistance without her walker.  Skin: dry, intact, no rashes or lesions Neuro: Strength and sensation grossly intact. Facial movements symmetric. PERRL. EOMI. No gross neurologic deficits.   Assessment/Plan: HYPERTENSION, BENIGN SYSTEMIC Patient's BP not controlled in clinic today, however difficult to ascertain whether this was secondary to acute illness/vomiting. Will continue on current regimen for now.  The patient has HH-RN coming out and they've get up a telehealth system with a BP cuff so that she can be monitored more frequently. - Continue to f/u on BPs to determine whether more adjusts need to be made  - If BPs continue to be elevated, will discuss compliance with the HH-RN and determine if there are barriers to taking her medication vs whether this is just a change in her  medication needs.  - pt to f/u in 1 week or sooner as necessary   Seasonal allergies Patient's symptoms of "head fullness" and dizziness are most likely secondary to seasonal allergies, given the post-nasal drip, rhinorrhea, and time of the year. She has taken Benadryl in the past for this, however I've advised her to avoid Benadryl given her age. This is less likely vertigo given the description and that it does not change with position. Less likely infectious in nature given lack of fever/chills.  Less likely TIA vs intracranial bleed vs CVA given there were no neurologic deficits on exam and per her daughter, Ms. Skiff appears to be at her baseline.  -  Will Rx for Claritin  - Discussed RTC precautions, patient voiced understanding.    Nausea with vomiting Patient presenting with vomiting that began in clinic today. This may be secondary to taking numerous medications without food, however it is incredibly early in the course to determine.  The left sided flank pain in addition to vomiting could be consistent with nephrolithiasis, however there was no CVA tenderness or urinary symptoms. Given her h/o diabetes, it could be diabetic gastroparesis. While concerning, this is less likely a SBO or mesenteric ischemia given pretty benign physical exam and absence of rebound/guaridng/rigidity.  - Zofran PRN nausea  - Discussed the importance of adequate PO intake- advised pt/daughter to return if she is unable to maintain anything by mouth  - Will obtain a BMET today. - Return precautions given: hematemesis, worsening emesis with poor PO intake, worsening abdominal pain, new associated symptoms - patient to f/u in 1 week or sooner as necessary.    Flank pain Pain present since last Thursday with no change. Less likely nephrolithiasis given absence of urinary symptoms or CVA tenderness. Will hold off on getting U/A given lack of symptoms. Most likely MSK in nature given it is worse with movement and  minimal at rest. Additionally, she's had this pain in the past which resolved with time. Less likely GI related given the onset was on Thursday with no other associated symptoms. Until this AM, patient has continued to have good PO intake.  - continue to f/u pain  - RTC precautions discussed - patient has tramadol at home which she can take if her PO intake, N/V improves. Discussed not taking the Flexeril she was prescribed given it is on the [employer] list.      Orders Placed This Encounter  Procedures  . Basic Metabolic Panel  . Glucose, capillary  . Glucose (CBG)    Meds ordered this encounter  Medications  . ondansetron (ZOFRAN ODT) 4 MG disintegrating tablet    Sig: Take 1 tablet (4 mg total) by mouth every 8 (eight) hours as needed for nausea or vomiting.    Dispense:  20 tablet    Refill:  0  . loratadine (CLARITIN) 10 MG tablet    Sig: Take 0.5 tablets (5 mg total) by mouth daily.    Dispense:  30 tablet    Refill:  [facility] PGY-1, [facility]   Patient noted to have hypercalcemia on BMET. This appears chronic. 8 months ago she had a PTH what was inappropriately elevated at 80.7 This is most likely secondary hyperparathyroidism vs familial hypocalciuric hypercalcemia. Patient has a h/o of both breast and colon cancer, however one would expect PTH to be low in that setting. In the future, could consider repeat PTH vs assessing for adenoma.

## 2015-02-10 ENCOUNTER — Encounter: Payer: Self-pay | Admitting: Family Medicine

## 2015-02-10 DIAGNOSIS — M5030 Other cervical disc degeneration, unspecified cervical region: Secondary | ICD-10-CM | POA: Diagnosis not present

## 2015-02-10 DIAGNOSIS — E119 Type 2 diabetes mellitus without complications: Secondary | ICD-10-CM | POA: Diagnosis not present

## 2015-02-10 DIAGNOSIS — I5022 Chronic systolic (congestive) heart failure: Secondary | ICD-10-CM | POA: Diagnosis not present

## 2015-02-10 DIAGNOSIS — R112 Nausea with vomiting, unspecified: Secondary | ICD-10-CM | POA: Insufficient documentation

## 2015-02-10 DIAGNOSIS — J302 Other seasonal allergic rhinitis: Secondary | ICD-10-CM | POA: Insufficient documentation

## 2015-02-10 DIAGNOSIS — R109 Unspecified abdominal pain: Secondary | ICD-10-CM | POA: Insufficient documentation

## 2015-02-10 DIAGNOSIS — I2510 Atherosclerotic heart disease of native coronary artery without angina pectoris: Secondary | ICD-10-CM | POA: Diagnosis not present

## 2015-02-10 DIAGNOSIS — J45909 Unspecified asthma, uncomplicated: Secondary | ICD-10-CM | POA: Diagnosis not present

## 2015-02-10 DIAGNOSIS — I10 Essential (primary) hypertension: Secondary | ICD-10-CM | POA: Diagnosis not present

## 2015-02-10 NOTE — Assessment & Plan Note (Signed)
Patient's symptoms of "head fullness" and dizziness are most likely secondary to seasonal allergies, given the post-nasal drip, rhinorrhea, and time of the year. She has taken Benadryl in the past for this, however I've advised her to avoid Benadryl given her age. This is less likely vertigo given the description and that it does not change with position. Less likely infectious in nature given lack of fever/chills.  Less likely TIA vs intracranial bleed vs CVA given there were no neurologic deficits on exam and per her daughter, Ms. Holzworth appears to be at her baseline.  - Will Rx for Claritin  - Discussed RTC precautions, patient voiced understanding.

## 2015-02-10 NOTE — Assessment & Plan Note (Signed)
Patient's BP not controlled in clinic today, however difficult to ascertain whether this was secondary to acute illness/vomiting. Will continue on current regimen for now.  The patient has HH-RN coming out and they've get up a telehealth system with a BP cuff so that she can be monitored more frequently. - Continue to f/u on BPs to determine whether more adjusts need to be made  - If BPs continue to be elevated, will discuss compliance with the HH-RN and determine if there are barriers to taking her medication vs whether this is just a change in her medication needs.  - pt to f/u in 1 week or sooner as necessary

## 2015-02-10 NOTE — Assessment & Plan Note (Addendum)
Patient presenting with vomiting that began in clinic today. This may be secondary to taking numerous medications without food, however it is incredibly early in the course to determine.  The left sided flank pain in addition to vomiting could be consistent with nephrolithiasis, however there was no CVA tenderness or urinary symptoms. Given her h/o diabetes, it could be diabetic gastroparesis. While concerning, this is less likely a SBO or mesenteric ischemia given pretty benign physical exam and absence of rebound/guaridng/rigidity.  - Zofran PRN nausea  - Discussed the importance of adequate PO intake- advised pt/daughter to return if she is unable to maintain anything by mouth  - Will obtain a BMET today. - Return precautions given: hematemesis, worsening emesis with poor PO intake, worsening abdominal pain, new associated symptoms - patient to f/u in 1 week or sooner as necessary.

## 2015-02-10 NOTE — Assessment & Plan Note (Signed)
Pain present since last Thursday with no change. Less likely nephrolithiasis given absence of urinary symptoms or CVA tenderness. Will hold off on getting U/A given lack of symptoms. Most likely MSK in nature given it is worse with movement and minimal at rest. Additionally, she's had this pain in the past which resolved with time. Less likely GI related given the onset was on Thursday with no other associated symptoms. Until this AM, patient has continued to have good PO intake.  - continue to f/u pain  - RTC precautions discussed - patient has tramadol at home which she can take if her PO intake, N/V improves. Discussed not taking the Flexeril she was prescribed given it is on the [employer].

## 2015-02-12 DIAGNOSIS — M5030 Other cervical disc degeneration, unspecified cervical region: Secondary | ICD-10-CM | POA: Diagnosis not present

## 2015-02-12 DIAGNOSIS — E119 Type 2 diabetes mellitus without complications: Secondary | ICD-10-CM | POA: Diagnosis not present

## 2015-02-12 DIAGNOSIS — J45909 Unspecified asthma, uncomplicated: Secondary | ICD-10-CM | POA: Diagnosis not present

## 2015-02-12 DIAGNOSIS — I5022 Chronic systolic (congestive) heart failure: Secondary | ICD-10-CM | POA: Diagnosis not present

## 2015-02-12 DIAGNOSIS — I10 Essential (primary) hypertension: Secondary | ICD-10-CM | POA: Diagnosis not present

## 2015-02-12 DIAGNOSIS — I2510 Atherosclerotic heart disease of native coronary artery without angina pectoris: Secondary | ICD-10-CM | POA: Diagnosis not present

## 2015-02-14 ENCOUNTER — Other Ambulatory Visit: Payer: Self-pay | Admitting: Family Medicine

## 2015-02-15 DIAGNOSIS — M5030 Other cervical disc degeneration, unspecified cervical region: Secondary | ICD-10-CM | POA: Diagnosis not present

## 2015-02-15 DIAGNOSIS — I10 Essential (primary) hypertension: Secondary | ICD-10-CM | POA: Diagnosis not present

## 2015-02-15 DIAGNOSIS — E119 Type 2 diabetes mellitus without complications: Secondary | ICD-10-CM | POA: Diagnosis not present

## 2015-02-15 DIAGNOSIS — I5022 Chronic systolic (congestive) heart failure: Secondary | ICD-10-CM | POA: Diagnosis not present

## 2015-02-15 DIAGNOSIS — I2510 Atherosclerotic heart disease of native coronary artery without angina pectoris: Secondary | ICD-10-CM | POA: Diagnosis not present

## 2015-02-15 DIAGNOSIS — J45909 Unspecified asthma, uncomplicated: Secondary | ICD-10-CM | POA: Diagnosis not present

## 2015-02-16 ENCOUNTER — Ambulatory Visit (INDEPENDENT_AMBULATORY_CARE_PROVIDER_SITE_OTHER): Payer: Medicare Other | Admitting: Family Medicine

## 2015-02-16 ENCOUNTER — Encounter: Payer: Self-pay | Admitting: Family Medicine

## 2015-02-16 VITALS — BP 167/81 | HR 90 | Temp 98.2°F | Ht 59.0 in | Wt 118.0 lb

## 2015-02-16 DIAGNOSIS — I10 Essential (primary) hypertension: Secondary | ICD-10-CM | POA: Diagnosis not present

## 2015-02-16 DIAGNOSIS — R112 Nausea with vomiting, unspecified: Secondary | ICD-10-CM | POA: Diagnosis present

## 2015-02-16 NOTE — Progress Notes (Signed)
Patient ID: [name], female   DOB: 1924-02-25, 81 y.o.   MRN: [id number]   HPI  Patient presents today for Follow up HTN  Patient states that she's taking one blood pressure medicine a day. She states that home health nurses have been checking her blood pressure Usually the 160/80 range She denies headache, chest pain, dyspnea, palpitations, and worsening leg edema. She's also taking Lasix  She states that home health is coming tomorrow to set up a blood pressure cuff that will transmit readings   Her nasuea has resolved  he denies dysuria, foul-smelling urine, abdominal pain  Smoking status noted ROS: Per HPI  Objective: BP 167/81 mmHg  Pulse 90  Temp(Src) 98.2 F (36.8 C) (Oral)  Ht 4\' 11"  (1.499 m)  Wt 118 lb (53.524 kg)  BMI 23.82 kg/m2 Gen: NAD, alert, cooperative with exam HEENT: NCAT CV: RRR, good S1/S2, no murmur Resp: CTABL, no wheezes, non-labored Ext: trace to 1+ pitting edema BL Neuro: Alert and oriented, No gross deficits, walks with walker  Assessment and plan:  HYPERTENSION, BENIGN SYSTEMIC Still elevated today No red flags Patient very unsure of what meds she is taking.  Will ask HH to check her pills and call with what she is actually taking, she states she only takes 1 bp med Also ask her to bring pills to visits.  By our orders she is on lasix, amlodipine, and losartan

## 2015-02-16 NOTE — Patient Instructions (Signed)
Great to see you again!  Please bring your medicines with you in the future  Please ask your home health nurse to look at your meds and let us know if you are taking the following?  Lasix Losartan Amlodipine  Ask if she would call and leave Dr. Wendi Snipes and Dr. Lorenso Courier a message with which ones you are on.   Please come back to see Dr. Lorenso Courier in about 1 month

## 2015-02-16 NOTE — Assessment & Plan Note (Addendum)
Still elevated today No red flags Patient very unsure of what meds she is taking.  Will ask HH to check her pills and call with what she is actually taking, she states she only takes 1 bp med Also ask her to bring pills to visits.  By our orders she is on lasix, amlodipine, and losartan

## 2015-02-17 DIAGNOSIS — E119 Type 2 diabetes mellitus without complications: Secondary | ICD-10-CM | POA: Diagnosis not present

## 2015-02-17 DIAGNOSIS — I10 Essential (primary) hypertension: Secondary | ICD-10-CM | POA: Diagnosis not present

## 2015-02-17 DIAGNOSIS — M5030 Other cervical disc degeneration, unspecified cervical region: Secondary | ICD-10-CM | POA: Diagnosis not present

## 2015-02-17 DIAGNOSIS — I2510 Atherosclerotic heart disease of native coronary artery without angina pectoris: Secondary | ICD-10-CM | POA: Diagnosis not present

## 2015-02-17 DIAGNOSIS — I5022 Chronic systolic (congestive) heart failure: Secondary | ICD-10-CM | POA: Diagnosis not present

## 2015-02-17 DIAGNOSIS — J45909 Unspecified asthma, uncomplicated: Secondary | ICD-10-CM | POA: Diagnosis not present

## 2015-02-19 DIAGNOSIS — J45909 Unspecified asthma, uncomplicated: Secondary | ICD-10-CM | POA: Diagnosis not present

## 2015-02-19 DIAGNOSIS — I5022 Chronic systolic (congestive) heart failure: Secondary | ICD-10-CM | POA: Diagnosis not present

## 2015-02-19 DIAGNOSIS — E119 Type 2 diabetes mellitus without complications: Secondary | ICD-10-CM | POA: Diagnosis not present

## 2015-02-19 DIAGNOSIS — I2510 Atherosclerotic heart disease of native coronary artery without angina pectoris: Secondary | ICD-10-CM | POA: Diagnosis not present

## 2015-02-19 DIAGNOSIS — I10 Essential (primary) hypertension: Secondary | ICD-10-CM | POA: Diagnosis not present

## 2015-02-19 DIAGNOSIS — M5030 Other cervical disc degeneration, unspecified cervical region: Secondary | ICD-10-CM | POA: Diagnosis not present

## 2015-02-22 ENCOUNTER — Other Ambulatory Visit: Payer: Self-pay | Admitting: Obstetrics and Gynecology

## 2015-02-24 ENCOUNTER — Telehealth: Payer: Self-pay | Admitting: Family Medicine

## 2015-02-24 ENCOUNTER — Ambulatory Visit (INDEPENDENT_AMBULATORY_CARE_PROVIDER_SITE_OTHER): Payer: Medicare Other | Admitting: Podiatry

## 2015-02-24 DIAGNOSIS — B351 Tinea unguium: Secondary | ICD-10-CM

## 2015-02-24 DIAGNOSIS — M79676 Pain in unspecified toe(s): Secondary | ICD-10-CM

## 2015-02-24 DIAGNOSIS — I5022 Chronic systolic (congestive) heart failure: Secondary | ICD-10-CM | POA: Diagnosis not present

## 2015-02-24 DIAGNOSIS — I10 Essential (primary) hypertension: Secondary | ICD-10-CM | POA: Diagnosis not present

## 2015-02-24 DIAGNOSIS — M5030 Other cervical disc degeneration, unspecified cervical region: Secondary | ICD-10-CM | POA: Diagnosis not present

## 2015-02-24 DIAGNOSIS — E119 Type 2 diabetes mellitus without complications: Secondary | ICD-10-CM | POA: Diagnosis not present

## 2015-02-24 DIAGNOSIS — I2510 Atherosclerotic heart disease of native coronary artery without angina pectoris: Secondary | ICD-10-CM | POA: Diagnosis not present

## 2015-02-24 DIAGNOSIS — J45909 Unspecified asthma, uncomplicated: Secondary | ICD-10-CM | POA: Diagnosis not present

## 2015-02-24 NOTE — Telephone Encounter (Signed)
Mickel Baas, nurse with [facility] calls, wants to report that patient is not taking Amlodipine but is taking Carvedilol twice daily. Any questions, please call Mickel Baas at [phone].

## 2015-02-24 NOTE — Patient Instructions (Signed)
Diabetes and Foot Care Diabetes may cause you to have problems because of poor blood supply (circulation) to your feet and legs. This may cause the skin on your feet to become thinner, break easier, and heal more slowly. Your skin may become dry, and the skin may peel and crack. You may also have nerve damage in your legs and feet causing decreased feeling in them. You may not notice minor injuries to your feet that could lead to infections or more serious problems. Taking care of your feet is one of the most important things you can do for yourself.  HOME CARE INSTRUCTIONS  Wear shoes at all times, even in the house. Do not go barefoot. Bare feet are easily injured.  Check your feet daily for blisters, cuts, and redness. If you cannot see the bottom of your feet, use a mirror or ask someone for help.  Wash your feet with warm water (do not use hot water) and mild soap. Then pat your feet and the areas between your toes until they are completely dry. Do not soak your feet as this can dry your skin.  Apply a moisturizing lotion or petroleum jelly (that does not contain alcohol and is unscented) to the skin on your feet and to dry, brittle toenails. Do not apply lotion between your toes.  Trim your toenails straight across. Do not dig under them or around the cuticle. File the edges of your nails with an emery board or nail file.  Do not cut corns or calluses or try to remove them with medicine.  Wear clean socks or stockings every day. Make sure they are not too tight. Do not wear knee-high stockings since they may decrease blood flow to your legs.  Wear shoes that fit properly and have enough cushioning. To break in new shoes, wear them for just a few hours a day. This prevents you from injuring your feet. Always look in your shoes before you put them on to be sure there are no objects inside.  Do not cross your legs. This may decrease the blood flow to your feet.  If you find a minor scrape,  cut, or break in the skin on your feet, keep it and the skin around it clean and dry. These areas may be cleansed with mild soap and water. Do not cleanse the area with peroxide, alcohol, or iodine.  When you remove an adhesive bandage, be sure not to damage the skin around it.  If you have a wound, look at it several times a day to make sure it is healing.  Do not use heating pads or hot water bottles. They may burn your skin. If you have lost feeling in your feet or legs, you may not know it is happening until it is too late.  Make sure your health care provider performs a complete foot exam at least annually or more often if you have foot problems. Report any cuts, sores, or bruises to your health care provider immediately. SEEK MEDICAL CARE IF:   You have an injury that is not healing.  You have cuts or breaks in the skin.  You have an ingrown nail.  You notice redness on your legs or feet.  You feel burning or tingling in your legs or feet.  You have pain or cramps in your legs and feet.  Your legs or feet are numb.  Your feet always feel cold. SEEK IMMEDIATE MEDICAL CARE IF:   There is increasing redness,   swelling, or pain in or around a wound.  There is a red line that goes up your leg.  Pus is coming from a wound.  You develop a fever or as directed by your health care provider.  You notice a bad smell coming from an ulcer or wound. Document Released: 10/27/2000 Document Revised: 07/02/2013 Document Reviewed: 04/08/2013 [employer] Patient Information 2015 [employer]. This information is not intended to replace advice given to you by your health care provider. Make sure you discuss any questions you have with your health care provider.  

## 2015-02-25 NOTE — Progress Notes (Signed)
Patient ID: [name], female   DOB: March 10, 1924, 81 y.o.   MRN: [id number]  Subjective: This patient presents for ongoing debridement of painful toenails associated with walking wearing shoes  Objective: Toenails are hypertrophic, elongated, incurvated and tender to palpation 6-10  Assessment: Type II diabetic Symptomatic onychomycoses 6-10  Plan: Debridement toenails 10 without any bleeding  Reappoint 3 months

## 2015-02-26 ENCOUNTER — Telehealth: Payer: Self-pay | Admitting: Family Medicine

## 2015-02-26 DIAGNOSIS — I5022 Chronic systolic (congestive) heart failure: Secondary | ICD-10-CM | POA: Diagnosis not present

## 2015-02-26 DIAGNOSIS — E119 Type 2 diabetes mellitus without complications: Secondary | ICD-10-CM | POA: Diagnosis not present

## 2015-02-26 DIAGNOSIS — J45909 Unspecified asthma, uncomplicated: Secondary | ICD-10-CM | POA: Diagnosis not present

## 2015-02-26 DIAGNOSIS — I10 Essential (primary) hypertension: Secondary | ICD-10-CM | POA: Diagnosis not present

## 2015-02-26 DIAGNOSIS — M5030 Other cervical disc degeneration, unspecified cervical region: Secondary | ICD-10-CM | POA: Diagnosis not present

## 2015-02-26 DIAGNOSIS — I2510 Atherosclerotic heart disease of native coronary artery without angina pectoris: Secondary | ICD-10-CM | POA: Diagnosis not present

## 2015-02-26 MED ORDER — AMLODIPINE BESYLATE 5 MG PO TABS: 5.0000 mg | ORAL_TABLET | Freq: Every day | ORAL | Status: DC

## 2015-02-26 NOTE — Telephone Encounter (Signed)
Received notification that patient has high bp, needs direction.

## 2015-02-26 NOTE — Addendum Note (Signed)
Addended by: [name] on: 02/26/2015 07:26 PM   Modules accepted: Orders

## 2015-02-26 NOTE — Telephone Encounter (Signed)
Spoke to [employer]. They received a message from telemetry stating that the patient's BP was 226/83 at 9:28am this AM. She was not aware of another BP. HH-PT to see her today and will re-check a BP.  Called Ms. Villari, her last BP was 178/86.  She has a h/o of not taking amlodipine 5mg , asked her to take this now. She is currently asymptomatic. No HA, vision changes, dizziness, chest pain, or SOB.  HH-PT to contact PCP if BPs remain elevated. RTC precautions discussed.    [name], MD [facility] Resident  02/26/2015, 12:02 PM

## 2015-02-26 NOTE — Telephone Encounter (Signed)
Spoke with [facility] RN, patient was instructed to take amlodipine earlier today by Dr. Lorenso Courier but took losartan instead, nurse states that patient is completely out of amlodipine and will need a refill sent to her pharmacy ([facility]). Message to PCP.

## 2015-02-26 NOTE — Telephone Encounter (Signed)
Refilled patient's amlodipine.  Thanks, [name], MD [facility] Resident  02/26/2015, 7:26 PM

## 2015-03-03 DIAGNOSIS — E119 Type 2 diabetes mellitus without complications: Secondary | ICD-10-CM | POA: Diagnosis not present

## 2015-03-03 DIAGNOSIS — J45909 Unspecified asthma, uncomplicated: Secondary | ICD-10-CM | POA: Diagnosis not present

## 2015-03-03 DIAGNOSIS — I2510 Atherosclerotic heart disease of native coronary artery without angina pectoris: Secondary | ICD-10-CM | POA: Diagnosis not present

## 2015-03-03 DIAGNOSIS — M5030 Other cervical disc degeneration, unspecified cervical region: Secondary | ICD-10-CM | POA: Diagnosis not present

## 2015-03-03 DIAGNOSIS — I5022 Chronic systolic (congestive) heart failure: Secondary | ICD-10-CM | POA: Diagnosis not present

## 2015-03-03 DIAGNOSIS — I10 Essential (primary) hypertension: Secondary | ICD-10-CM | POA: Diagnosis not present

## 2015-03-03 NOTE — Telephone Encounter (Signed)
Please schedule the pt to come in sooner with myself or someone else on Red team.  I believe the patient is at a point where someone needs to help set her medications out each week to ensure she's been taking them all (from looking at her BPs send from telecommunications, her BPs have ranged greatly from severely elevated to hypotension).   Thanks, [employer]

## 2015-03-03 NOTE — Telephone Encounter (Signed)
[name] with [name] called to say that [name] bp #s have been highly elevated.  [name] stated she's taking meds, but numbers are reflecting different.  At 12:30 this afternnoon bp level was 192/79 and 188/80 at 4:00 pm.  Patient have an appt on 5/3 with provider but clinical practice mgr with [name] thinks she should be seen sooner.

## 2015-03-04 ENCOUNTER — Encounter: Payer: Self-pay | Admitting: Family Medicine

## 2015-03-04 ENCOUNTER — Ambulatory Visit (INDEPENDENT_AMBULATORY_CARE_PROVIDER_SITE_OTHER): Payer: Medicare Other | Admitting: Family Medicine

## 2015-03-04 VITALS — BP 148/68 | HR 68 | Ht 59.0 in | Wt 116.6 lb

## 2015-03-04 DIAGNOSIS — R112 Nausea with vomiting, unspecified: Secondary | ICD-10-CM | POA: Diagnosis present

## 2015-03-04 DIAGNOSIS — I10 Essential (primary) hypertension: Secondary | ICD-10-CM | POA: Diagnosis not present

## 2015-03-04 MED ORDER — CARVEDILOL 12.5 MG PO TABS: 12.5000 mg | ORAL_TABLET | Freq: Two times a day (BID) | ORAL | Status: DC

## 2015-03-04 NOTE — Telephone Encounter (Signed)
Patient scheduled for today at 11am with Dr. Venetia Maxon.

## 2015-03-04 NOTE — Patient Instructions (Addendum)
Thank you for coming in, today!  Everything looks and sounds okay, today. Take these medicines for your blood pressure: 1. Amlodipine (Norvasc) 5 mg every day 2. Losartan (Cozaar) 50 mg every day 3. Carvedilol (Coreg) 12.5 mg twice a day 4. Furosemide (Lasix) half a tablet daily as needed for swelling or fluid.  I sent in a new refill for the carvedilol. That one was taken off your list in the hospital, but I want you to keep taking it twice a day every day. You can pick it up the refill when you need.  Only use the tramadol when you are having pain, no more than once every 12 hours. You don't need to take this every day.  Come back to see Dr. Lorenso Courier on 5/3 at 1:30 in the afternoon. Call or come back sooner if you need. Please feel free to call with any questions or concerns at any time, at [phone]. --Dr. Venetia Maxon

## 2015-03-04 NOTE — Progress Notes (Signed)
   Subjective:    Patient ID: [name], female    DOB: [birth year], 81 y.o.   MRN: [id number]  HPI: Pt presents to clinic for SDA for elevated BP for about 1 week; they have been very high lately but before that, they typically would run "good" (she is unsure of the numbers -- per telephone notes, they have been 474'Q-595'G systolic) . Her daughter helps with her medications; pt believes she is taking all her regular medications for blood pressures but can't name any. Pt has a home health person (unsure if she is an Engineer, production or nurse) that comes out twice per week to help review things.   Of note, pt was admitted recently for stroke-like symptoms (presumed hypertensive encephalopathy), but these have all resolved. She denies chest pains, SOB, LE swelling, headaches, change in her vision, fever / chills, N/V, abdominal pain, or change in her stools or bladder habits. Denies frank weakness / numbness or change in mental status.  Review of Systems: As above.     Objective:   Physical Exam BP 170/66 mmHg  Pulse 68  Ht 4\' 11"  (1.499 m)  Wt 116 lb 9.6 oz (52.889 kg)  BMI 23.54 kg/m2 Manual recheck BP 148/68 Gen: well-appearing elderly adult female in NAD HEENT: [place]/AT, EOMI, PERRLA, MMM Neck: supple, normal ROM, no lymphadenopathy Cardio: RRR, no frank murmur appreciated Pulm: CTAB, no wheezes, normal WOB Abd: soft, nontender, BS+ Ext: warm, well-perfused, no LE edema Neuro: alert, oriented, no gross focal deficit  Gait slow / cautious using walker, but otherwise intact     Assessment & Plan:  81yo female with labile blood pressure and general poor personal health literacy, though she has HH and her daughter to help with medications, at home - medications reviewed  - Coreg stopped during hospitalization but she has continued to take it  - amlodipine not previously being taken consistently and required refill from PCP, recently  - losartan apparently being taken correctly  - Lasix taken  irregularly as needed, but "works well" when she takes it - overall, BP appears reasonably controlled with manual recheck after pt rests, with reported compliance with medications  Plan: - Continue Norvasc 5 mg daily, Coreg 12.5 mg daily, Cozaar 50 mg daily, and Lasix 10 mg daily PRN - written, specific instructions provided to continue these four medications, with pt instructed to give these to her daughter - pt has scheduled f/u with PCP Dr. Lorenso Courier on 5/3; advised pt to keep this appt and to f/u sooner if needed - reviewed red flags for presentation to the clinic or ED sooner than that (chest pain, dizziness, SOB, worse LE swelling, weakness / confusion, etc)  Note FYI to Dr. Jonni Sanger, MD PGY-3, [facility] 03/04/2015, 12:37 PM

## 2015-03-05 DIAGNOSIS — M5030 Other cervical disc degeneration, unspecified cervical region: Secondary | ICD-10-CM | POA: Diagnosis not present

## 2015-03-05 DIAGNOSIS — J45909 Unspecified asthma, uncomplicated: Secondary | ICD-10-CM | POA: Diagnosis not present

## 2015-03-05 DIAGNOSIS — I5022 Chronic systolic (congestive) heart failure: Secondary | ICD-10-CM | POA: Diagnosis not present

## 2015-03-05 DIAGNOSIS — I10 Essential (primary) hypertension: Secondary | ICD-10-CM | POA: Diagnosis not present

## 2015-03-05 DIAGNOSIS — E119 Type 2 diabetes mellitus without complications: Secondary | ICD-10-CM | POA: Diagnosis not present

## 2015-03-05 DIAGNOSIS — I2510 Atherosclerotic heart disease of native coronary artery without angina pectoris: Secondary | ICD-10-CM | POA: Diagnosis not present

## 2015-03-09 ENCOUNTER — Telehealth: Payer: Self-pay | Admitting: Family Medicine

## 2015-03-09 DIAGNOSIS — J45909 Unspecified asthma, uncomplicated: Secondary | ICD-10-CM | POA: Diagnosis not present

## 2015-03-09 DIAGNOSIS — I10 Essential (primary) hypertension: Secondary | ICD-10-CM | POA: Diagnosis not present

## 2015-03-09 DIAGNOSIS — I2510 Atherosclerotic heart disease of native coronary artery without angina pectoris: Secondary | ICD-10-CM | POA: Diagnosis not present

## 2015-03-09 DIAGNOSIS — E119 Type 2 diabetes mellitus without complications: Secondary | ICD-10-CM | POA: Diagnosis not present

## 2015-03-09 DIAGNOSIS — I5022 Chronic systolic (congestive) heart failure: Secondary | ICD-10-CM | POA: Diagnosis not present

## 2015-03-09 DIAGNOSIS — M5030 Other cervical disc degeneration, unspecified cervical region: Secondary | ICD-10-CM | POA: Diagnosis not present

## 2015-03-09 NOTE — Telephone Encounter (Signed)
Home health nurse from [facility] called to let the doctor know that the patient was filling bad today. She is taking all her medications and just not feeling well. jw

## 2015-03-10 NOTE — Telephone Encounter (Signed)
Called [name],  She states she's doing much better today. Her legs just felt "a little stiff" and she had some numbness in her feet yesterday, all of which she feels has significantly improved.  Advised the patient to come in if she notices that it begins to worsen, she has problems walking, or feels like she's going to fall.  Kathrine Cords, MD

## 2015-03-11 DIAGNOSIS — I2510 Atherosclerotic heart disease of native coronary artery without angina pectoris: Secondary | ICD-10-CM | POA: Diagnosis not present

## 2015-03-11 DIAGNOSIS — E119 Type 2 diabetes mellitus without complications: Secondary | ICD-10-CM | POA: Diagnosis not present

## 2015-03-11 DIAGNOSIS — I10 Essential (primary) hypertension: Secondary | ICD-10-CM | POA: Diagnosis not present

## 2015-03-11 DIAGNOSIS — J45909 Unspecified asthma, uncomplicated: Secondary | ICD-10-CM | POA: Diagnosis not present

## 2015-03-11 DIAGNOSIS — M5030 Other cervical disc degeneration, unspecified cervical region: Secondary | ICD-10-CM | POA: Diagnosis not present

## 2015-03-11 DIAGNOSIS — I5022 Chronic systolic (congestive) heart failure: Secondary | ICD-10-CM | POA: Diagnosis not present

## 2015-03-15 ENCOUNTER — Other Ambulatory Visit: Payer: Self-pay | Admitting: Family Medicine

## 2015-03-15 ENCOUNTER — Telehealth: Payer: Self-pay | Admitting: Family Medicine

## 2015-03-15 DIAGNOSIS — I2510 Atherosclerotic heart disease of native coronary artery without angina pectoris: Secondary | ICD-10-CM | POA: Diagnosis not present

## 2015-03-15 DIAGNOSIS — I10 Essential (primary) hypertension: Secondary | ICD-10-CM | POA: Diagnosis not present

## 2015-03-15 DIAGNOSIS — J45909 Unspecified asthma, uncomplicated: Secondary | ICD-10-CM | POA: Diagnosis not present

## 2015-03-15 DIAGNOSIS — E119 Type 2 diabetes mellitus without complications: Secondary | ICD-10-CM | POA: Diagnosis not present

## 2015-03-15 DIAGNOSIS — I5022 Chronic systolic (congestive) heart failure: Secondary | ICD-10-CM | POA: Diagnosis not present

## 2015-03-15 DIAGNOSIS — M5030 Other cervical disc degeneration, unspecified cervical region: Secondary | ICD-10-CM | POA: Diagnosis not present

## 2015-03-15 NOTE — Telephone Encounter (Signed)
Margarita Grizzle a home health nurse called and wanted the doctor to know that the patients BP is running low since the new medication. On the [facility] machine 80/52 and then when she took it the reading was 110/60.They are wanting to know should the hold off on the new medication or just take it as usual. Please call Margarita Grizzle at [phone] or call the patient and let her know. jw

## 2015-03-16 ENCOUNTER — Encounter: Payer: Self-pay | Admitting: Family Medicine

## 2015-03-16 ENCOUNTER — Ambulatory Visit (INDEPENDENT_AMBULATORY_CARE_PROVIDER_SITE_OTHER): Payer: Medicare Other | Admitting: Family Medicine

## 2015-03-16 VITALS — BP 138/53 | HR 83 | Temp 98.4°F | Ht 59.0 in | Wt 108.0 lb

## 2015-03-16 DIAGNOSIS — I5022 Chronic systolic (congestive) heart failure: Secondary | ICD-10-CM | POA: Diagnosis not present

## 2015-03-16 DIAGNOSIS — J45909 Unspecified asthma, uncomplicated: Secondary | ICD-10-CM | POA: Diagnosis not present

## 2015-03-16 DIAGNOSIS — M5030 Other cervical disc degeneration, unspecified cervical region: Secondary | ICD-10-CM | POA: Diagnosis not present

## 2015-03-16 DIAGNOSIS — E119 Type 2 diabetes mellitus without complications: Secondary | ICD-10-CM | POA: Diagnosis not present

## 2015-03-16 DIAGNOSIS — R112 Nausea with vomiting, unspecified: Secondary | ICD-10-CM | POA: Diagnosis present

## 2015-03-16 DIAGNOSIS — I2510 Atherosclerotic heart disease of native coronary artery without angina pectoris: Secondary | ICD-10-CM | POA: Diagnosis not present

## 2015-03-16 DIAGNOSIS — M546 Pain in thoracic spine: Secondary | ICD-10-CM | POA: Diagnosis not present

## 2015-03-16 DIAGNOSIS — I10 Essential (primary) hypertension: Secondary | ICD-10-CM | POA: Diagnosis not present

## 2015-03-16 DIAGNOSIS — R634 Abnormal weight loss: Secondary | ICD-10-CM

## 2015-03-16 MED ORDER — TRAMADOL HCL 50 MG PO TABS
50.0000 mg | ORAL_TABLET | Freq: Two times a day (BID) | ORAL | Status: DC | PRN
Start: 2015-03-16 — End: 2015-04-30

## 2015-03-16 MED ORDER — RANITIDINE HCL 150 MG PO CAPS: ORAL_CAPSULE | ORAL | Status: DC

## 2015-03-16 NOTE — Patient Instructions (Signed)
It was nice to see you again. Continue Norvasc (amlodipine) 5 mg at bedtime Coreg 12.5 mg twice daily Cozaar 50 mg in the morning  Lasix 10 mg (0.5 tablet) daily as needed for swelling.  Please start drinking more Ensure/Breeze.  Adding more seasoning to your foods may make it more appetizing  Please follow up with me in 4 weeks or sooner as needed

## 2015-03-16 NOTE — Telephone Encounter (Signed)
Returned call. Patient has been taking all meds consistently now (no new meds) and her BPs have been low. [name] will have the tele health fax over her most recent BPs. Pt has appt today.  [name], MD [facility] Resident  03/16/2015, 8:24 AM

## 2015-03-16 NOTE — Progress Notes (Signed)
Patient ID: [name], female   DOB: Apr 15, 1924, 81 y.o.   MRN: [id number]    Subjective: CC:f/u HTN HPI: Patient is a 81 y.o. female with a past medical history of breast CA, HTN, PAD, memory deficits, CHF, arthritis with generalized pain, and T2DM among other things presenting to clinic today for a f/u HTN.  Hypertension Blood pressure at home: 130s-80s/50s-80s  Blood pressure today: 138/53 Meds: Compliant with Norvasc 5 mg daily, Coreg 12.5 mg daily, Cozaar 50 mg daily, and Lasix 10 mg daily PRN. Of note, the pt has been taking a full tablet of Lasix (20mg ) daily instead of PRN (or at least she believes. Side effects: None ROS: Denies headache, dizziness, visual changes, nausea, vomiting, chest pain, abdominal pain or shortness of breath.  Loss of appetite/unintentional weight loss Patient states she has noticed she's losing weight, not intentional. Patient has a h/o breast and colon cancer.  No hematochezia, melena, no abdominal pain, no night sweats. She just states that she doesn't feel like eating and things do not taste as well. Food or cooking does not seem to be a barrier. We discussed adding more seasoning to her foods and supplement with Ensure (pt used to drink regularly, now only 1 q1-2weeks).  Social History: Patient uses snuff  ROS: All other systems reviewed and are negative.  Past Medical History Patient Active Problem List   Diagnosis Date Noted  . Seasonal allergies 02/10/2015  . Nausea with vomiting 02/10/2015  . Flank pain 02/10/2015  . Stroke-like symptoms   . Stroke-like episode 01/25/2015  . Syncope and collapse 01/25/2015  . Right leg pain   . Accelerated hypertension   . Hypertensive emergency 09/19/2014  . Dizziness 09/19/2014  . Anemia of chronic illness 08/19/2014  . Acute superficial venous thrombosis of right lower extremity 08/19/2014  . Right ankle pain 08/10/2014  . Hypercalcemia 06/09/2014  . Breast cancer, left breast 06/09/2014  .  Breast cancer, right breast 06/09/2014  . Edema of left lower extremity 03/30/2014  . PAD (peripheral artery disease) 03/30/2014  . Colon cancer 12/11/2013  . Neck pain 10/22/2013  . History of fall 05/15/2013  . Right thigh pain 05/15/2013  . Memory deficit 05/15/2013  . Bilateral leg pain 03/07/2013  . Trigger thumb of right hand 03/07/2013  . Lower extremity edema 02/19/2013  . Weakness generalized 01/22/2013  . Stricture and stenosis of esophagus 10/27/2012  . Poor social situation 09/13/2012  . Low back pain 08/07/2012  . Musculoskeletal chest pain 07/10/2012  . Thoracic back pain 05/24/2012  . Weight loss 04/03/2012  . Insomnia 09/25/2011  . Tobacco abuse 06/02/2011  . History of breast cancer in female, bilateral.  Mastectomies 10/24/2010. 04/26/2011  . Hiatal hernia 03/22/2011  . Colon polyp 03/22/2011  . Poor circulation 03/22/2011  . 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 NEUROPATHY 08/25/2009  . ANEMIA-IRON DEFICIENCY 04/29/2007  . HLD (hyperlipidemia) 01/29/2007  . Diabetes type 2, controlled 01/10/2007  . HYPERTENSION, BENIGN SYSTEMIC 01/10/2007  . Coronary atherosclerosis 01/10/2007  . Chronic systolic heart failure 65/78/4696  . Asthma 01/10/2007  . Reflux esophagitis 01/10/2007  . ARTHRITIS 01/10/2007    Medications- reviewed and updated Current Outpatient Prescriptions  Medication Sig Dispense Refill  . albuterol (PROVENTIL HFA;VENTOLIN HFA) 108 (90 BASE) MCG/ACT inhaler Inhale 2 puffs into the lungs every 6 (six) hours as needed. For shortness of breath 1 Inhaler 6  . amLODipine (NORVASC) 5 MG tablet Take 1 tablet (5 mg total) by mouth daily. 90 tablet 0  .  aspirin (ASPIRIN CHILDRENS) 81 MG chewable tablet Chew 81 mg by mouth daily.      . carvedilol (COREG) 12.5 MG tablet Take 1 tablet (12.5 mg total) by mouth 2 (two) times daily with a meal. 60 tablet 3  . clopidogrel (PLAVIX) 75 MG tablet TAKE 1 TABLET (75 MG TOTAL) BY MOUTH DAILY. 30 tablet 2    . cyclobenzaprine (FLEXERIL) 5 MG tablet Take 0.5-1 tablets (2.5-5 mg total) by mouth 3 (three) times daily as needed for muscle spasms. 30 tablet 1  . ferrous sulfate 325 (65 FE) MG tablet Take 1 tablet (325 mg total) by mouth daily with breakfast. 30 tablet 0  . furosemide (LASIX) 20 MG tablet Take 0.5 tablets (10 mg total) by mouth daily as needed for fluid or edema. 30 tablet 0  . gabapentin (NEURONTIN) 300 MG capsule Take 300 mg by mouth 2 (two) times daily.     Marland Kitchen glucose blood test strip Use as instructed Dx. Code 250.02 100 each 12  . loratadine (CLARITIN) 10 MG tablet Take 0.5 tablets (5 mg total) by mouth daily. 30 tablet 1  . losartan (COZAAR) 50 MG tablet Take 1 tablet (50 mg total) by mouth at bedtime. 30 tablet 0  . magnesium hydroxide (MILK OF MAGNESIA) 400 MG/5ML suspension Take 30 mLs by mouth daily as needed for mild constipation.    . metFORMIN (GLUCOPHAGE) 500 MG tablet TAKE 2 TABLETS (1,000 MG TOTAL) BY MOUTH 2 (TWO) TIMES DAILY WITH A MEAL. 120 tablet 1  . mirtazapine (REMERON) 15 MG tablet Take 15 mg by mouth at bedtime as needed (sleep).    . ondansetron (ZOFRAN ODT) 4 MG disintegrating tablet Take 1 tablet (4 mg total) by mouth every 8 (eight) hours as needed for nausea or vomiting. 20 tablet 0  . ondansetron (ZOFRAN) 4 MG tablet Take 1 tablet (4 mg total) by mouth every 8 (eight) hours as needed for nausea or vomiting. 20 tablet 0  . ranitidine (ZANTAC) 150 MG capsule TAKE ONE CAPSULE BY MOUTH TWICE A DAY AS NEEDED FOR HEARTBURN 60 capsule 3  . rosuvastatin (CRESTOR) 20 MG tablet Take 1 tablet (20 mg total) by mouth at bedtime. 30 tablet 2  . traMADol (ULTRAM) 50 MG tablet Take 1 tablet (50 mg total) by mouth every 12 (twelve) hours as needed for severe pain. 30 tablet 0  . VOLTAREN 1 % GEL APPLY 2 G TOPICALLY 4 (FOUR) TIMES DAILY. 100 g 0  . [DISCONTINUED] cetirizine (ZYRTEC) 5 MG tablet Take 1 tablet (5 mg total) by mouth daily. Take at night time as it can cause some  drowsiness 30 tablet 4   No current facility-administered medications for this visit.    Objective: Office vital signs reviewed. BP 138/53 mmHg  Pulse 83  Temp(Src) 98.4 F (36.9 C) (Oral)  Ht 4\' 11"  (1.499 m)  Wt 108 lb (48.988 kg)  BMI 21.80 kg/m2   Physical Examination:  General: Awake, alert, does not appear chronically ill. NAD and pleasant. Unaccompanied  Eyes: Conjunctiva non-injected. PERRL.  Cardio: RRR, no m/r/g noted.  Pulm: No increased WOB.  CTAB, without wheezes, rhonchi or crackles noted.  GI: soft, NT/ND,+BS x4 Extremities: WWP, No edema, cyanosis or clubbing; +1 pulses bilaterally. Normal strength for age and symmetric bilaterally. No pain with palpation over the knees or with movement. MSK: Normal gait and station with the use of a rolling walker  Skin: dry, intact, no rashes or lesions  Assessment/Plan: HYPERTENSION, BENIGN SYSTEMIC BPs have been  stable and slightly hypotensive per report. Has one BP 80/46 per tele health (through [facility]). Patient denies any symptoms of hypotension.  Unfortunately, I am very concerned that her daughter, [name], has not been giving her the medications appropriately (as it sounds like she's been taking double the dose of Lasix daily).   -Continue Norvasc (amlodipine) 5 mg; change to bedtime -Coreg 12.5 mg twice daily -Cozaar 50 mg in the morning  - Lasix 10 mg (0.5 tablet) daily as needed for swelling (stressed this to the patient and in AVS).  - Attempted to call HH-RN, Margarita Grizzle, concerning management however unfortunately could not get in touch with her. - F/u in 1 month or sooner as necessary   Weight loss Patient to attempt to increase PO intake and add back Ensure.  Weights have gone from 119 to 108.  No red flags on exam. The patient's significant diuresis with Lasix could be a contributing factor.  Given no hematochezia or melena, will hold off on referral to GI given this patient's age and health status.  - if she does gain  weight or continues to lose weight, will add mirtazapine qHS.     No orders of the defined types were placed in this encounter.    Meds ordered this encounter  Medications  . traMADol (ULTRAM) 50 MG tablet    Sig: Take 1 tablet (50 mg total) by mouth every 12 (twelve) hours as needed for severe pain.    Dispense:  30 tablet    Refill:  0  . ranitidine (ZANTAC) 150 MG capsule    Sig: TAKE ONE CAPSULE BY MOUTH TWICE A DAY AS NEEDED FOR HEARTBURN    Dispense:  60 capsule    Refill:  [place] PGY-1, [place]

## 2015-03-16 NOTE — Assessment & Plan Note (Signed)
Patient to attempt to increase PO intake and add back Ensure.  Weights have gone from 119 to 108.  No red flags on exam. The patient's significant diuresis with Lasix could be a contributing factor.  Given no hematochezia or melena, will hold off on referral to GI given this patient's age and health status.  - if [name] does gain weight or continues to lose weight, will add mirtazapine qHS.

## 2015-03-16 NOTE — Assessment & Plan Note (Addendum)
BPs have been stable and slightly hypotensive per report. Has one BP 80/46 per tele health (through [facility]). Patient denies any symptoms of hypotension.  Unfortunately, I am very concerned that her daughter, [name], has not been giving her the medications appropriately (as it sounds like she's been taking double the dose of Lasix daily).   -Continue Norvasc (amlodipine) 5 mg; change to bedtime -Coreg 12.5 mg twice daily -Cozaar 50 mg in the morning  - Lasix 10 mg (0.5 tablet) daily as needed for swelling (stressed this to the patient and in AVS).  - Attempted to call HH-RN, Margarita Grizzle, concerning management however unfortunately could not get in touch with her. - F/u in 1 month or sooner as necessary

## 2015-03-17 ENCOUNTER — Other Ambulatory Visit: Payer: Self-pay | Admitting: Obstetrics and Gynecology

## 2015-03-17 ENCOUNTER — Other Ambulatory Visit: Payer: Self-pay | Admitting: Surgery

## 2015-03-17 DIAGNOSIS — J45909 Unspecified asthma, uncomplicated: Secondary | ICD-10-CM | POA: Diagnosis not present

## 2015-03-17 DIAGNOSIS — M5030 Other cervical disc degeneration, unspecified cervical region: Secondary | ICD-10-CM | POA: Diagnosis not present

## 2015-03-17 DIAGNOSIS — I2510 Atherosclerotic heart disease of native coronary artery without angina pectoris: Secondary | ICD-10-CM | POA: Diagnosis not present

## 2015-03-17 DIAGNOSIS — E119 Type 2 diabetes mellitus without complications: Secondary | ICD-10-CM | POA: Diagnosis not present

## 2015-03-17 DIAGNOSIS — I5022 Chronic systolic (congestive) heart failure: Secondary | ICD-10-CM | POA: Diagnosis not present

## 2015-03-17 DIAGNOSIS — I10 Essential (primary) hypertension: Secondary | ICD-10-CM | POA: Diagnosis not present

## 2015-03-18 IMAGING — CT CT HEAD W/O CM
1 series · 16 of 30 positions shown, 20 images · non-contrast
Comparison: 09/19/2014

CLINICAL DATA: Code stroke.  Confusion.  Bilateral weakness.

EXAM:
CT HEAD WITHOUT CONTRAST
TECHNIQUE: Contiguous axial images were obtained from the base of the skull
through the vertex without intravenous contrast.

[Series 2: head 5.0 h31s · axial · 0.43mm/px · z∈[-139,-4]mm · 16 of 31 slices shown, 20 images]
[im 2/31  brain]
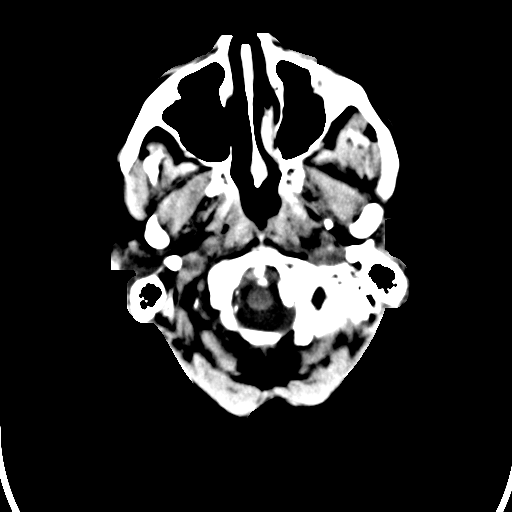
[im 2/31  bone]
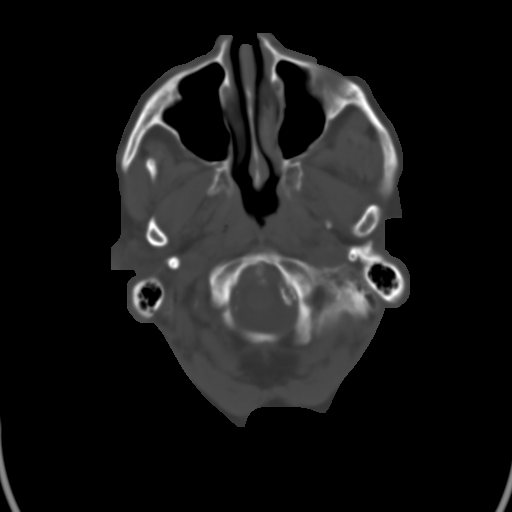
[im 4/31  brain]
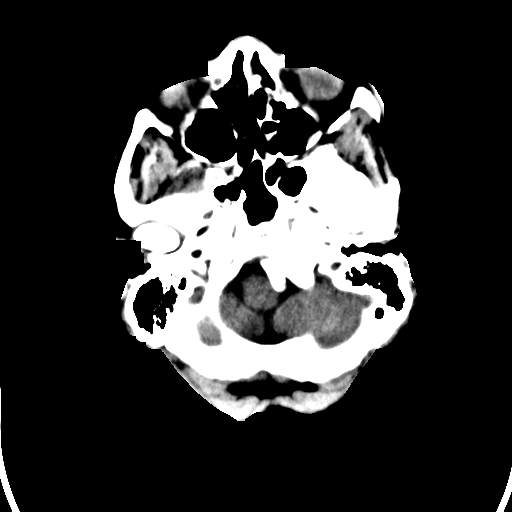
[im 6/31  brain]
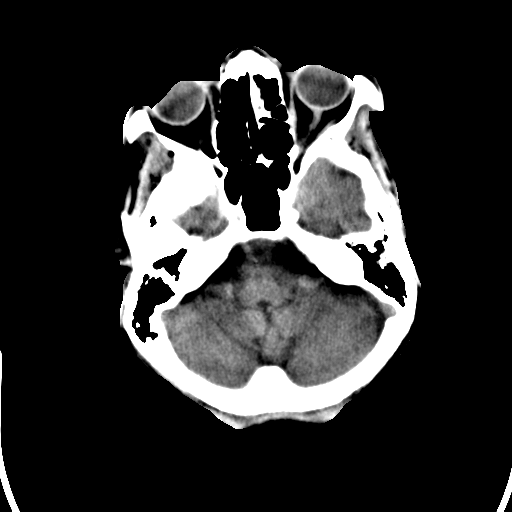
[im 8/31  brain]
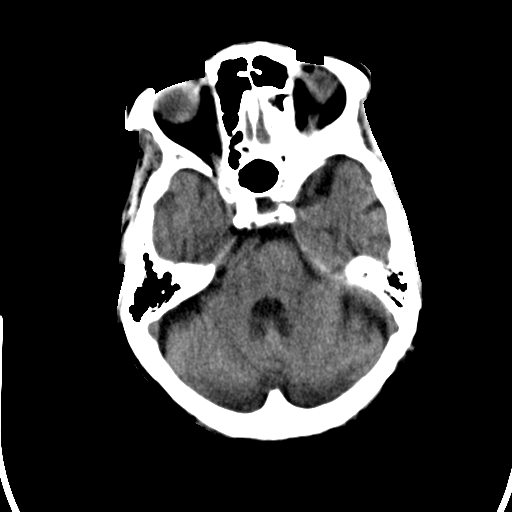
[im 9/31  brain]
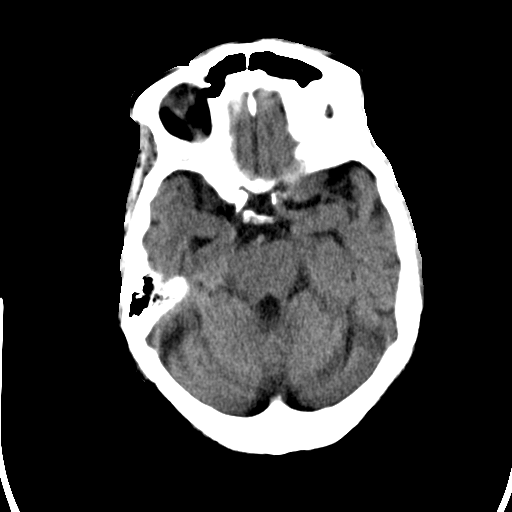
[im 9/31  bone]
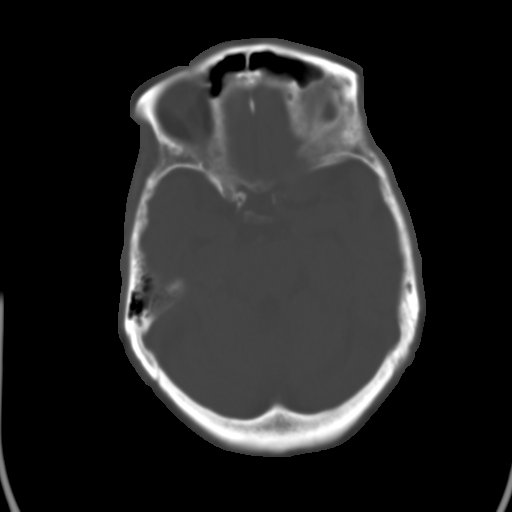
[im 11/31  brain]
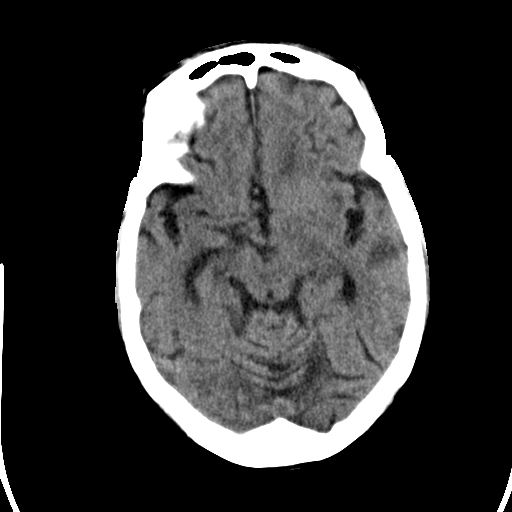
[im 13/31  brain]
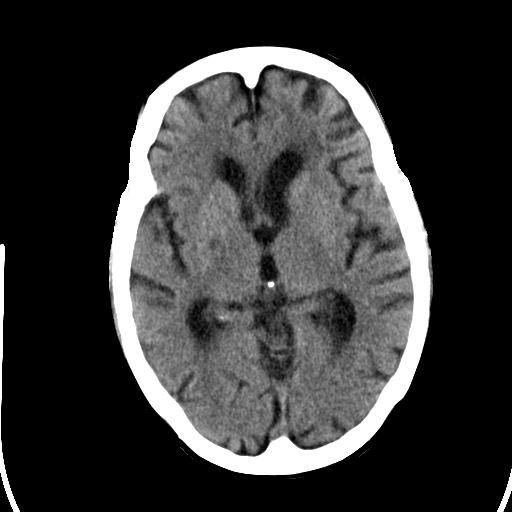
[im 15/31  brain]
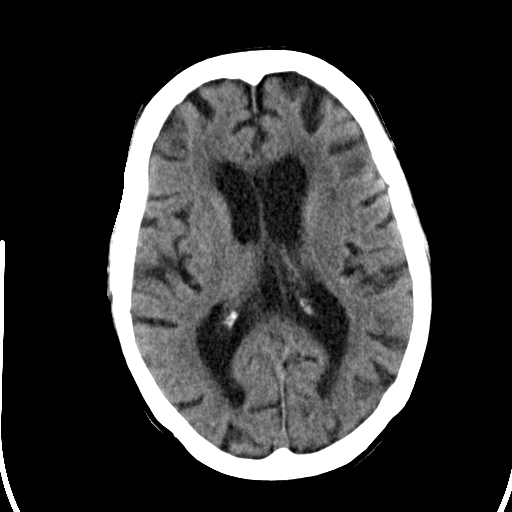
[im 16/31  brain]
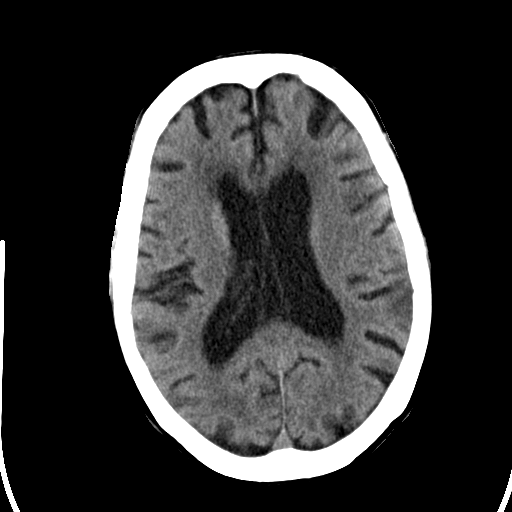
[im 16/31  bone]
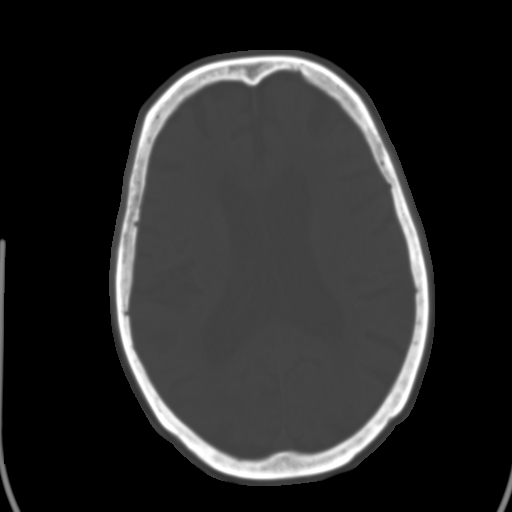
[im 18/31  brain]
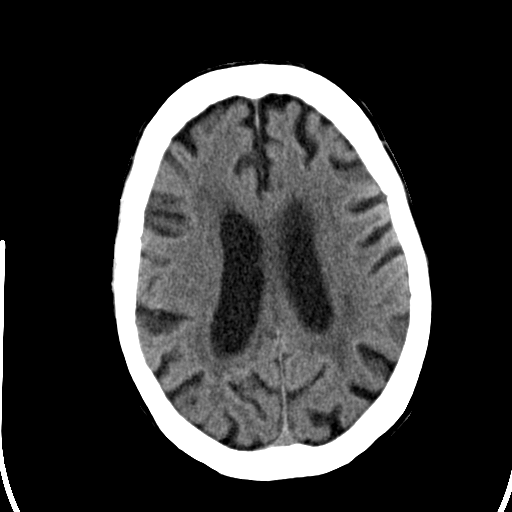
[im 20/31  brain]
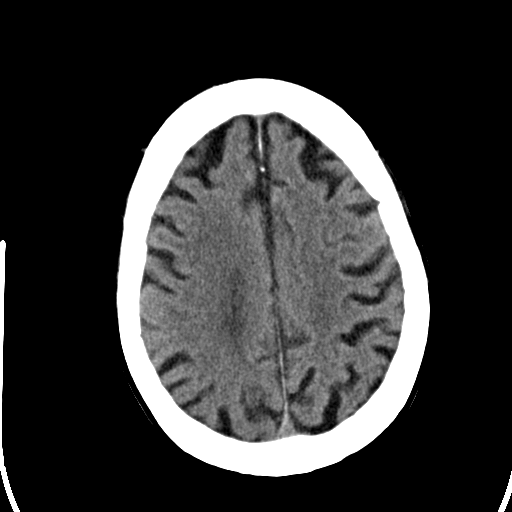
[im 22/31  brain]
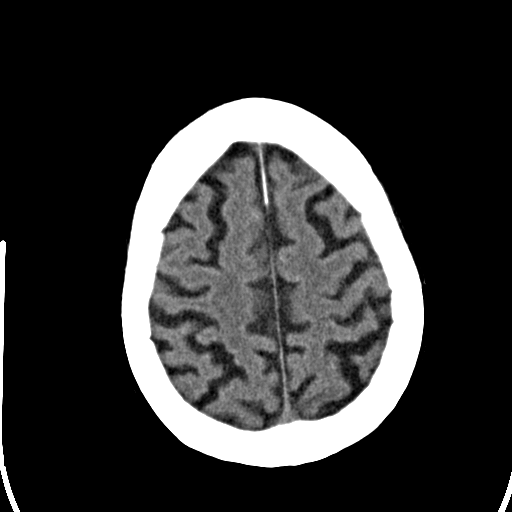
[im 23/31  brain]
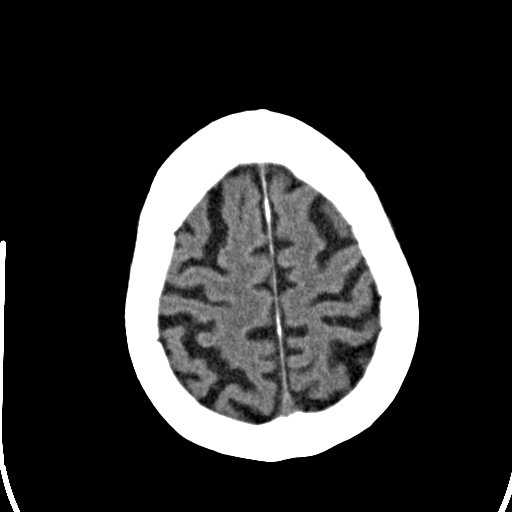
[im 23/31  bone]
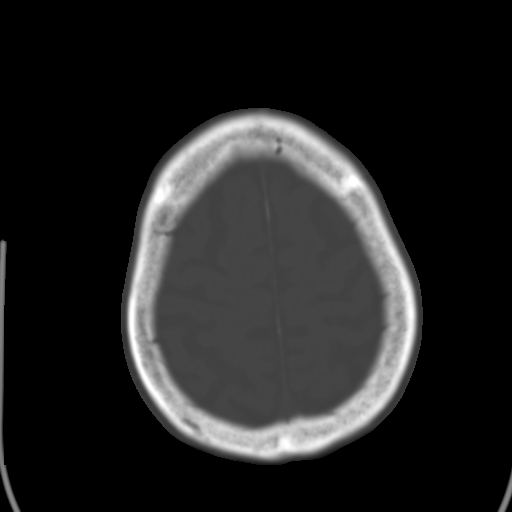
[im 25/31  brain]
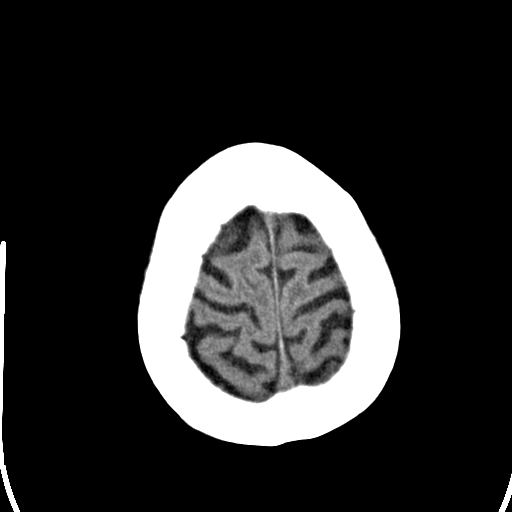
[im 27/31  brain]
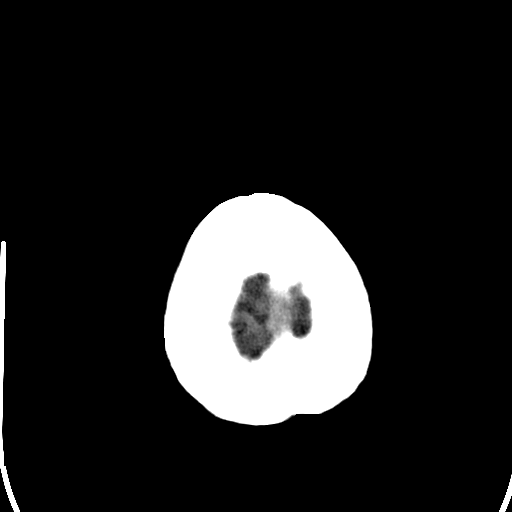
[im 29/31  brain]
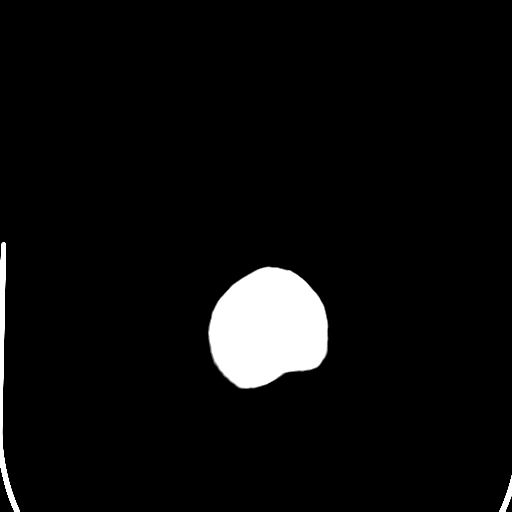

[16 of 30 positions shown; findings below may reference images not displayed]

FINDINGS: There is atrophy and chronic small vessel disease changes. No acute
intracranial abnormality. Specifically, no hemorrhage,
hydrocephalus, mass lesion, acute infarction, or significant
intracranial injury. No acute calvarial abnormality.
IMPRESSION: No acute intracranial abnormality.

Atrophy, chronic microvascular disease.

Critical Value/emergent results were called by telephone at the time
of interpretation on 01/25/2015 at [DATE] to Dr. BLAIN JUMPER ,
who verbally acknowledged these results.

## 2015-03-18 IMAGING — MR MR HEAD W/O CM
9 of 10 series · 36 of 48 positions shown · non-contrast
Comparison: CT head 01/25/2015 earlier today.

CLINICAL DATA: Code stroke. Last seen normal 3500 hours today.
Altered mental status. History of breast cancer. Initial encounter.

EXAM:
MRI HEAD WITHOUT CONTRAST
TECHNIQUE: Multiplanar, multiecho pulse sequences of the brain and surrounding
structures were obtained without intravenous contrast.

[Series 3: DWI · axial · 3.0mm · 1.09mm/px · z∈[-38,+106]mm · 9 of 98 slices shown (1 of 4)]
[im 1/98]
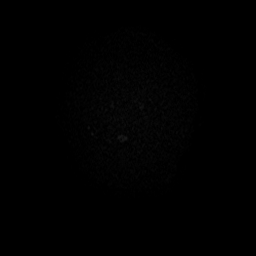
[im 18/98]
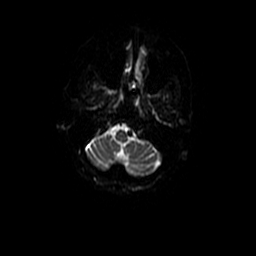
[im 27/98]
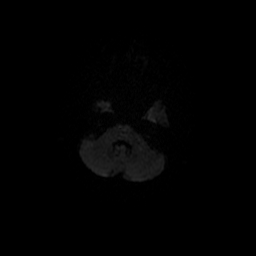
[im 45/98]
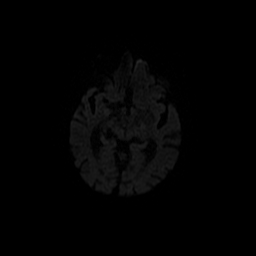
[im 53/98]
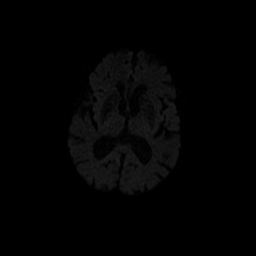
[im 71/98]
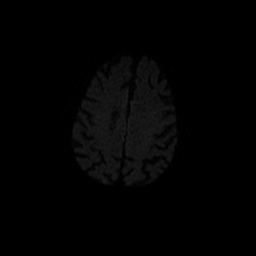
[im 80/98]
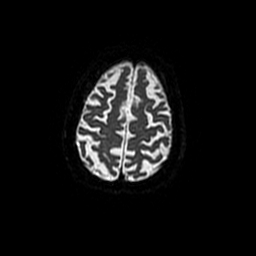
[im 89/98]
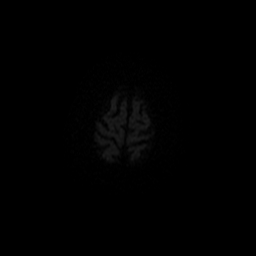
[im 98/98]
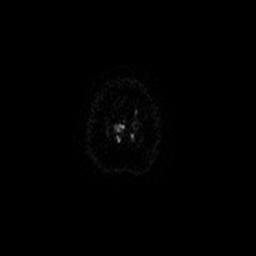

[Series 4: FLAIR · axial · 5.0mm · 0.43mm/px · z∈[-16,+103]mm · 3 of 21 slices shown]
[im 1/21]
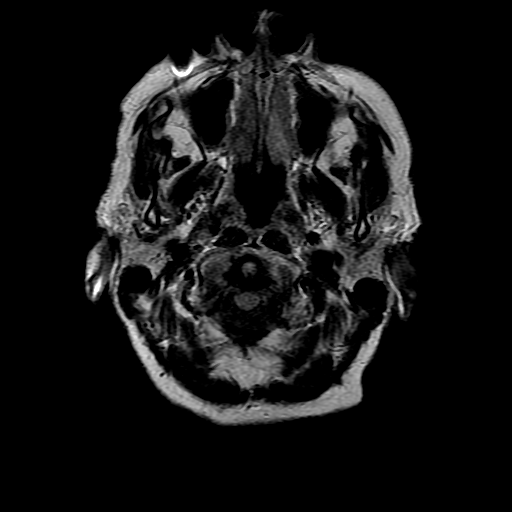
[im 11/21]
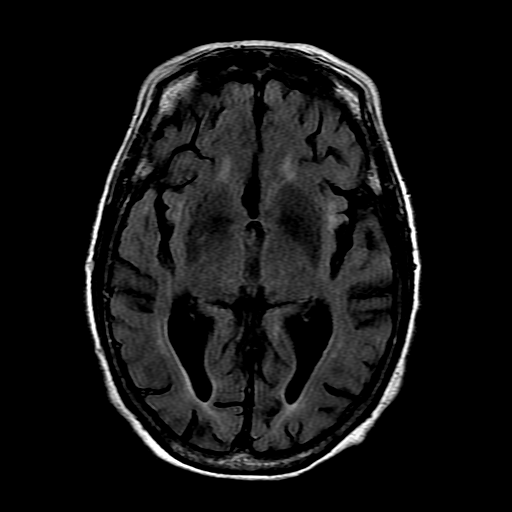
[im 21/21]
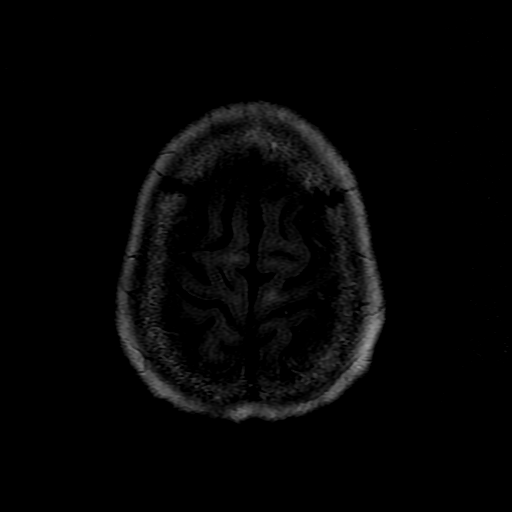

[Series 5: DWI · coronal · 3.0mm · 1.09mm/px · 7 of 64 slices shown (2 of 4)]
[im 1/64]
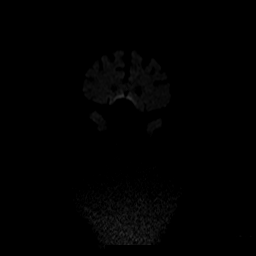
[im 11/64]
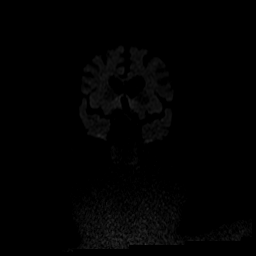
[im 22/64]
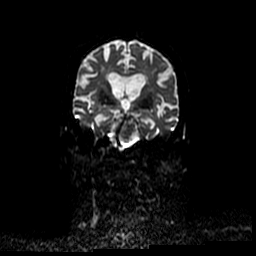
[im 32/64]
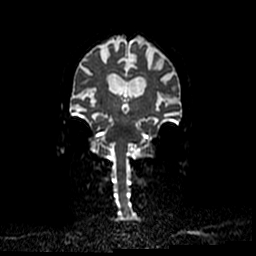
[im 43/64]
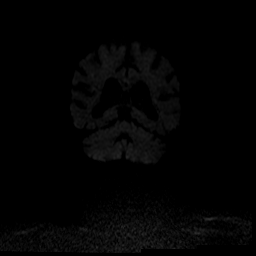
[im 53/64]
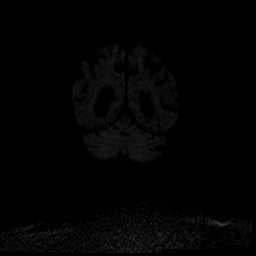
[im 64/64]
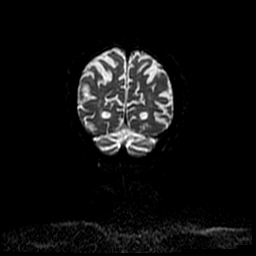

[Series 6: T1 · sagittal · 5.0mm · 0.47mm/px · 2 of 22 slices shown]
[im 1/22]
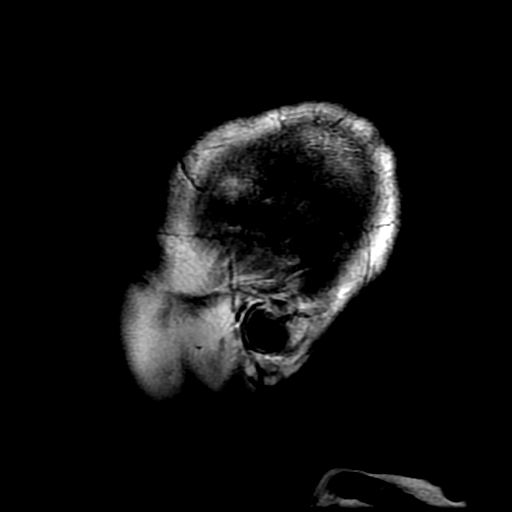
[im 22/22]
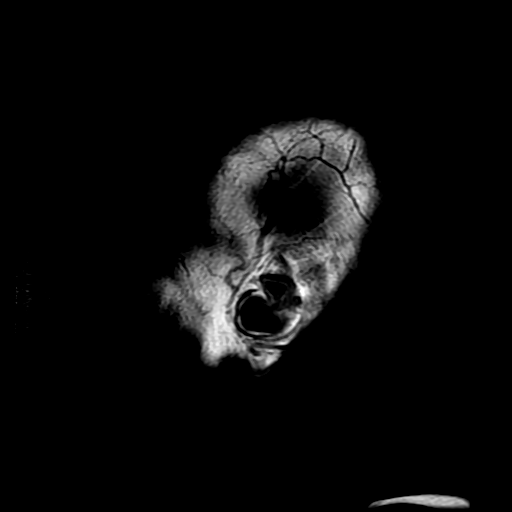

[Series 7: T2 · axial · 5.0mm · 0.43mm/px · z∈[-16,+103]mm · 2 of 21 slices shown (1 of 2)]
[im 1/21]
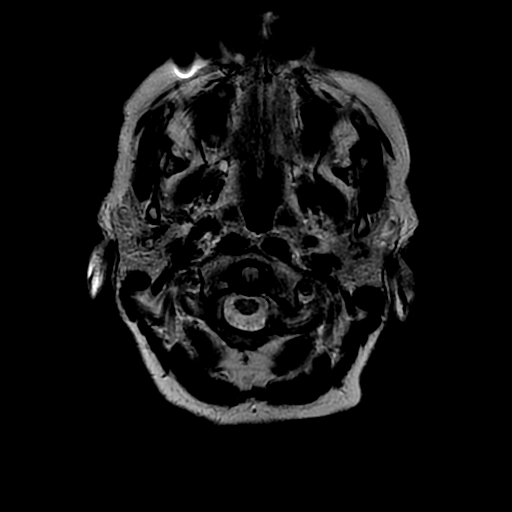
[im 21/21]
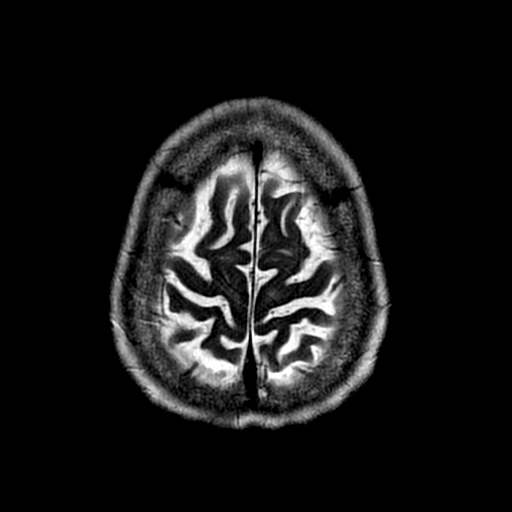

[Series 8: ax mpgr · axial · 5.0mm · 0.43mm/px · z∈[-16,+103]mm · 2 of 21 slices shown]
[im 1/21]
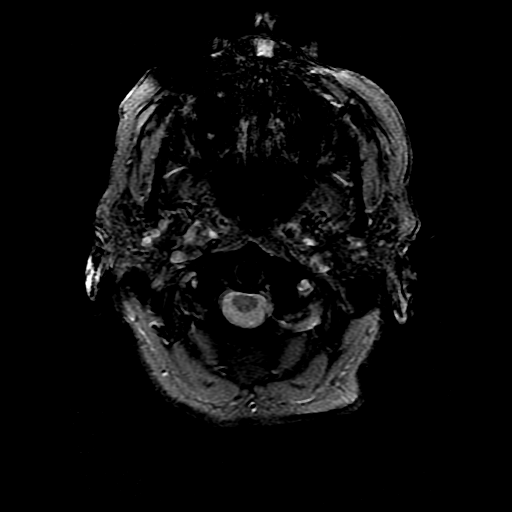
[im 21/21]
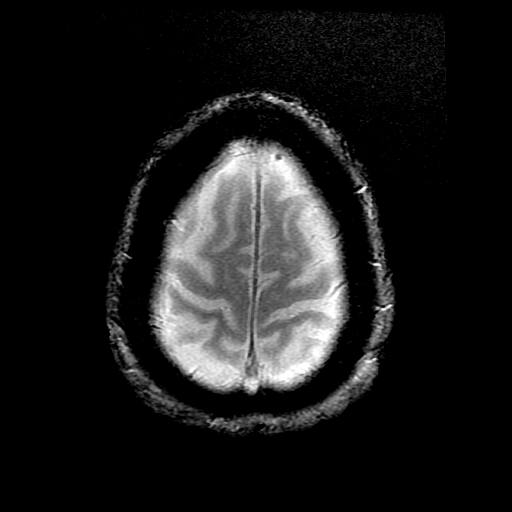

[Series 10: T2 · coronal · 5.0mm · 0.43mm/px · 3 of 27 slices shown (2 of 2)]
[im 1/27]
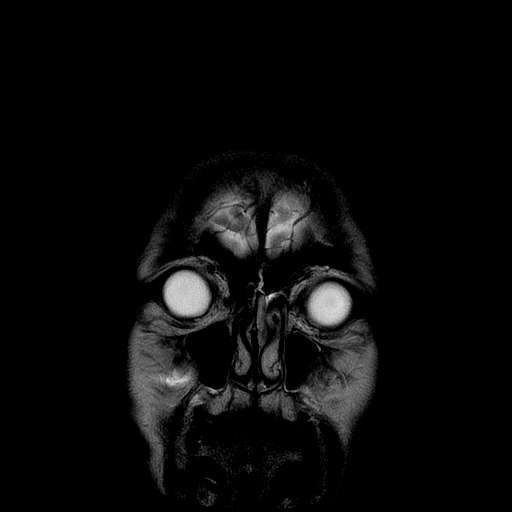
[im 14/27]
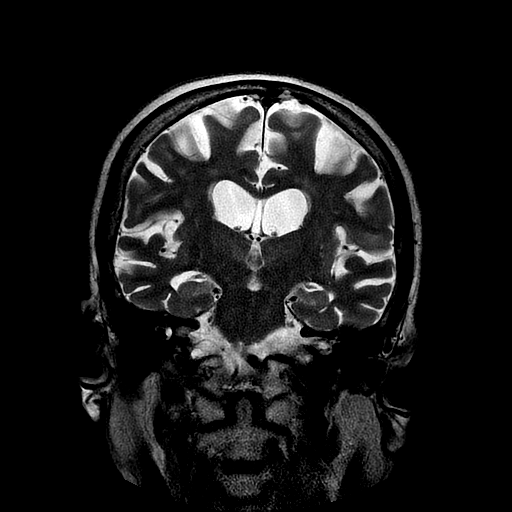
[im 27/27]
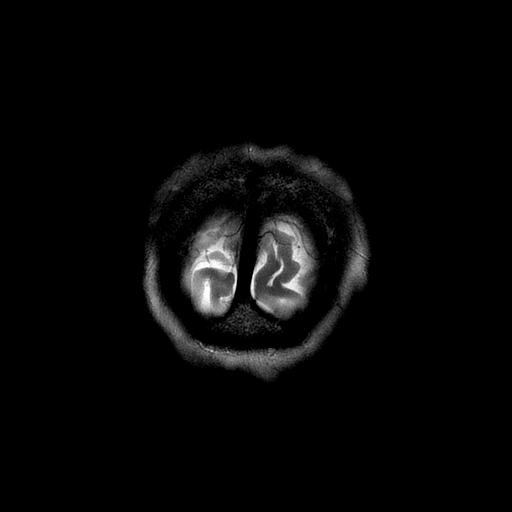

[Series 300: DWI · axial · 3.0mm · 1.09mm/px · z∈[-38,+106]mm · 5 of 49 slices shown (3 of 4)]
[im 1/49]
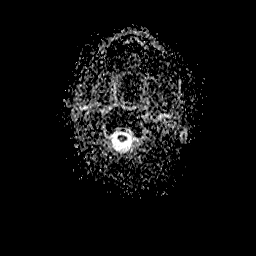
[im 13/49]
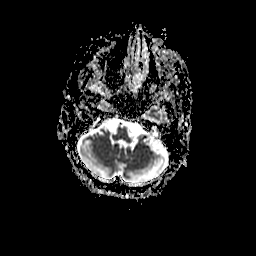
[im 25/49]
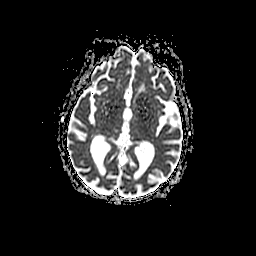
[im 37/49]
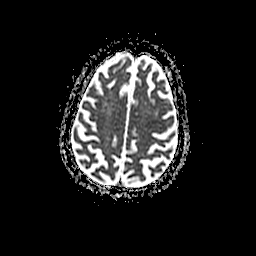
[im 49/49]
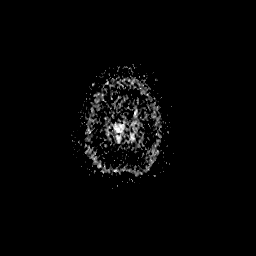

[Series 500: DWI · coronal · 3.0mm · 1.09mm/px · 3 of 32 slices shown (4 of 4)]
[im 1/32]
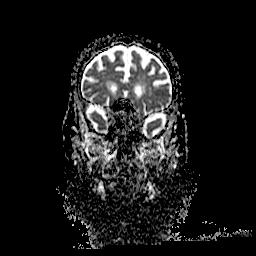
[im 16/32]
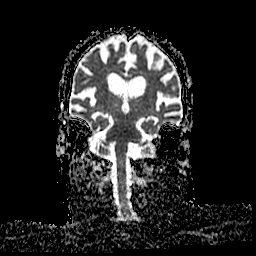
[im 32/32]
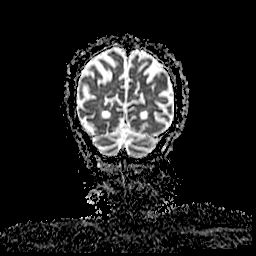

[36 of 48 positions shown; findings below may reference images not displayed]

FINDINGS: No evidence for acute infarction, hemorrhage, mass lesion,
hydrocephalus, or extra-axial fluid. Generalized atrophy. Moderately
advanced chronic microvascular ischemic change involving the
periventricular and subcortical white matter, with lesser changes in
the pons and cerebellum. Remote lacunar infarct in the RIGHT
lentiform nucleus and LEFT thalamus. Flow voids are maintained with
diminutive RIGHT vertebral. Tiny foci of susceptibility in the LEFT
frontal white matter likely representing hypertensive microbleeds or
post treatment effect. No acute osseous findings. No midline
abnormality. BILATERAL cataract extraction. No sinus air-fluid level
or significant mastoid fluid.
IMPRESSION: Chronic changes as described.

No intracranial mass lesion or evidence for acute infarction.

Good general agreement with prior CT.

## 2015-03-19 IMAGING — CR DG CHEST 2V
2 series · 2 of 2 positions shown · non-contrast
Comparison: 09/21/2014

CLINICAL DATA: Cough and right neck pain

EXAM:
CHEST  2 VIEW

[chest lat]
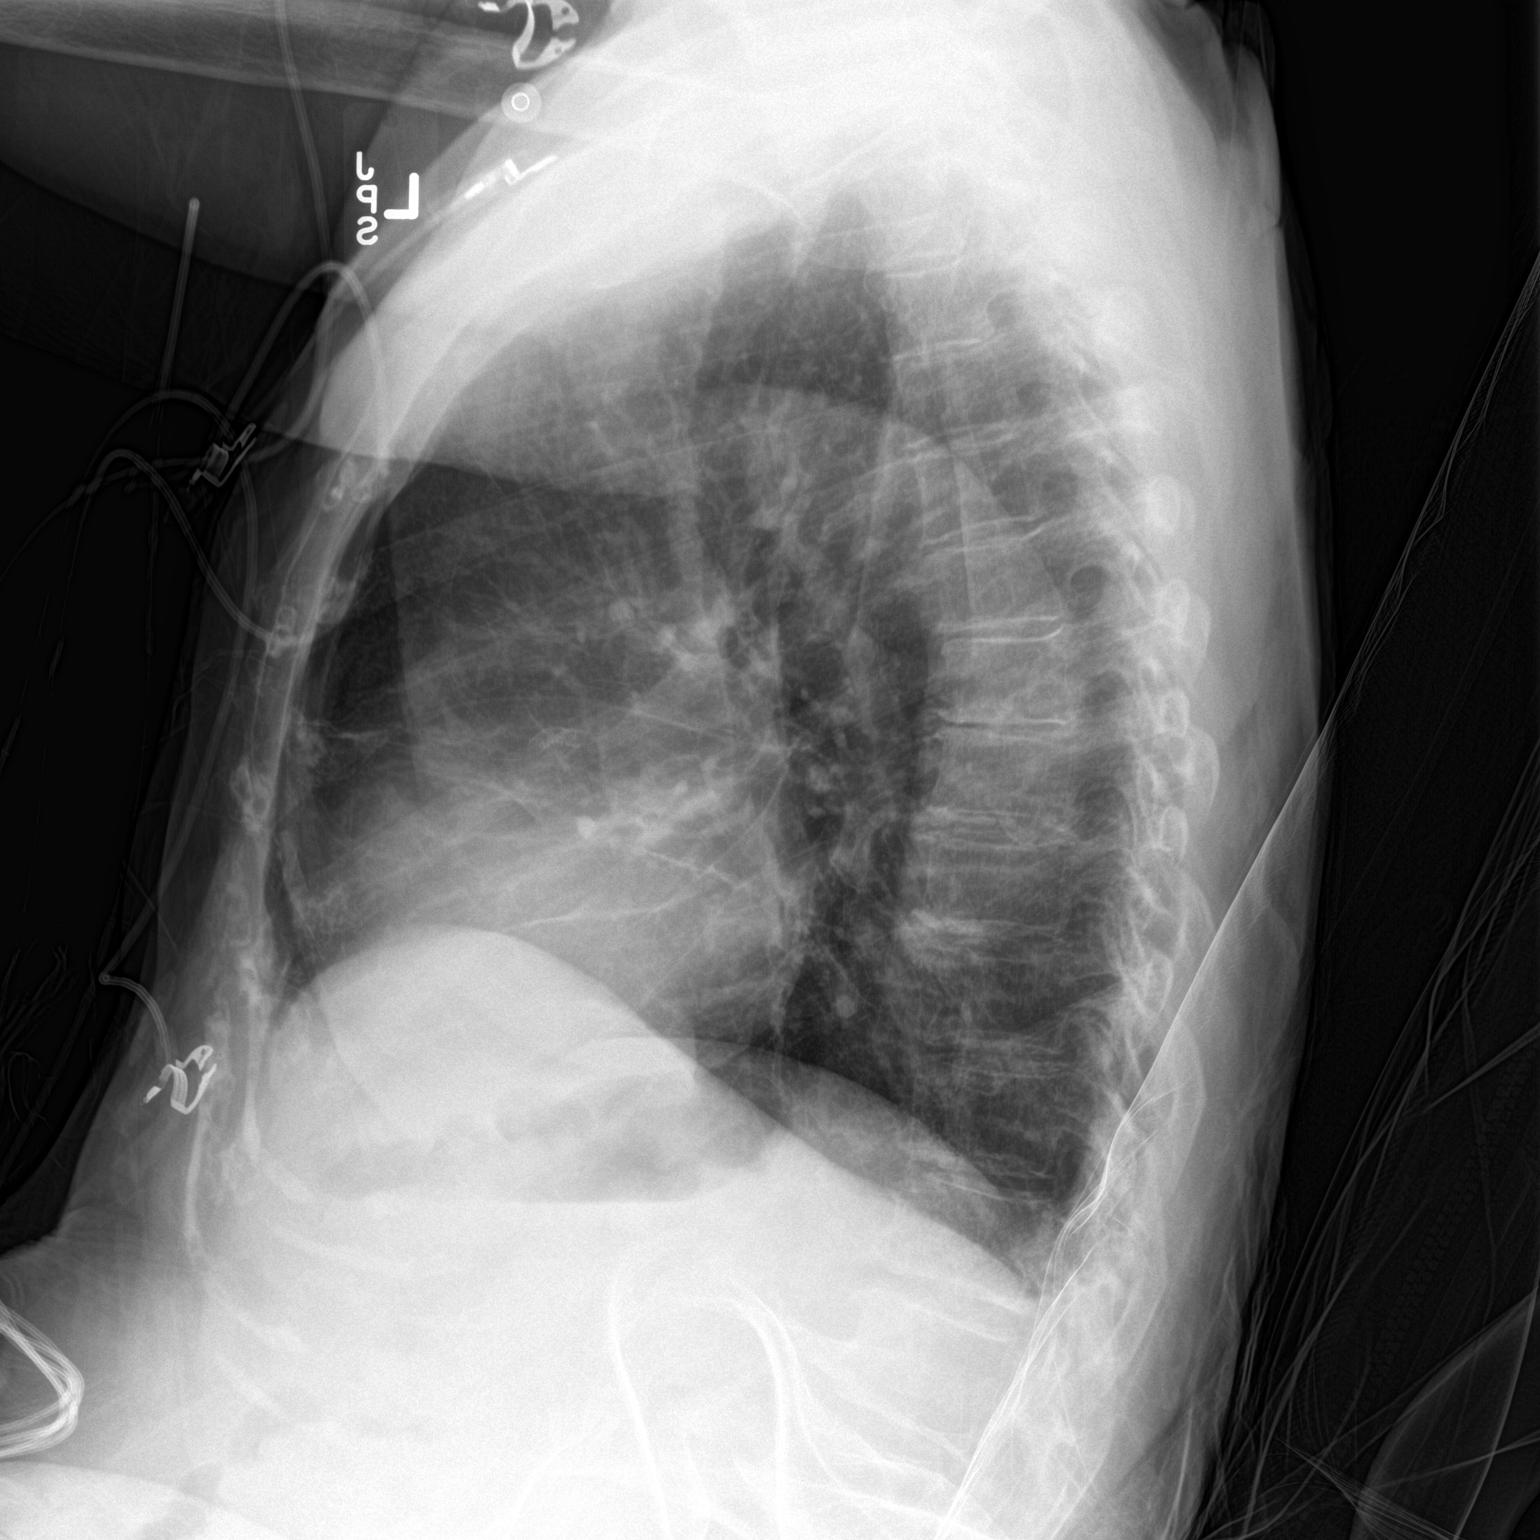

[chest ap]
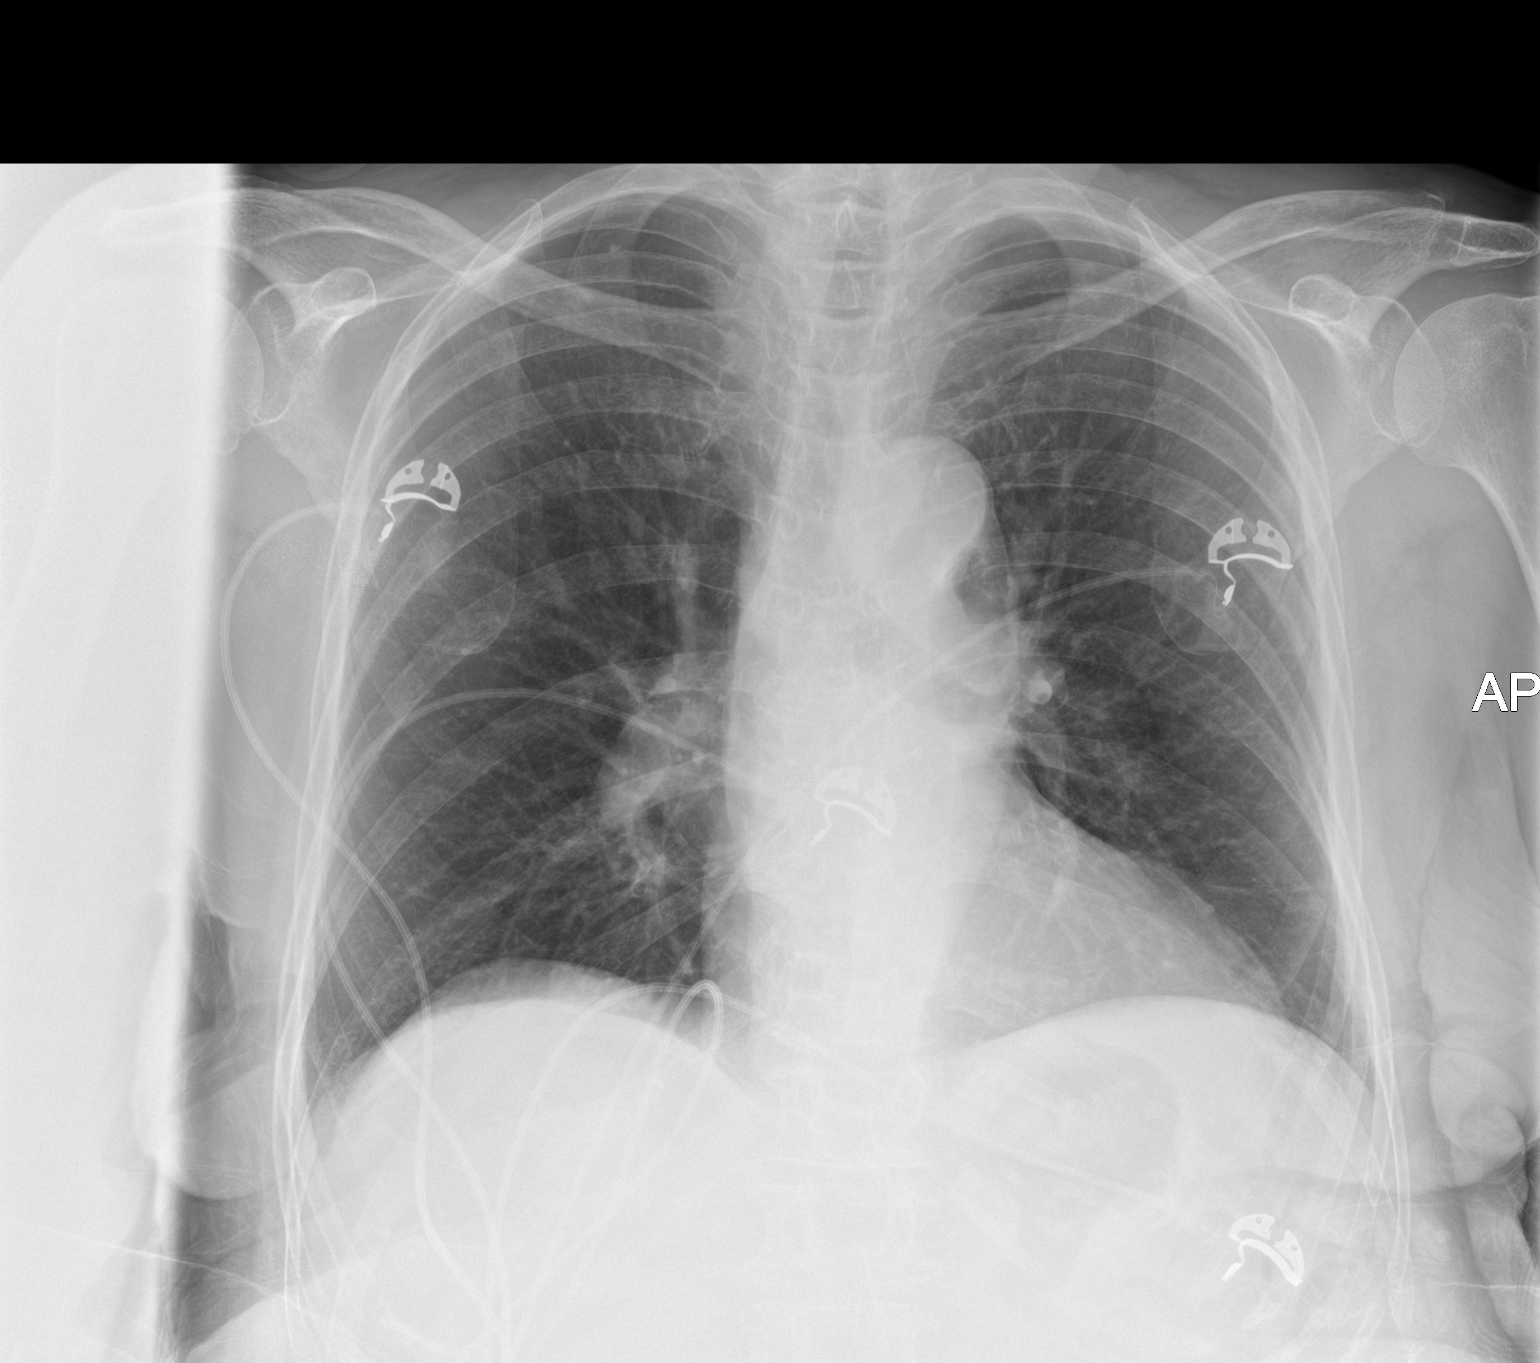

[2 of 2 positions shown; findings below may reference images not displayed]

FINDINGS: Generous heart size without interval change. There is mild aortic
tortuosity which is also stable. The hila are negative. There is no
edema, consolidation, effusion, or pneumothorax.
IMPRESSION: No active cardiopulmonary disease.

## 2015-03-23 ENCOUNTER — Telehealth: Payer: Self-pay | Admitting: *Deleted

## 2015-03-23 DIAGNOSIS — J45909 Unspecified asthma, uncomplicated: Secondary | ICD-10-CM | POA: Diagnosis not present

## 2015-03-23 DIAGNOSIS — I5022 Chronic systolic (congestive) heart failure: Secondary | ICD-10-CM | POA: Diagnosis not present

## 2015-03-23 DIAGNOSIS — E119 Type 2 diabetes mellitus without complications: Secondary | ICD-10-CM | POA: Diagnosis not present

## 2015-03-23 DIAGNOSIS — I2510 Atherosclerotic heart disease of native coronary artery without angina pectoris: Secondary | ICD-10-CM | POA: Diagnosis not present

## 2015-03-23 DIAGNOSIS — M5030 Other cervical disc degeneration, unspecified cervical region: Secondary | ICD-10-CM | POA: Diagnosis not present

## 2015-03-23 DIAGNOSIS — I10 Essential (primary) hypertension: Secondary | ICD-10-CM | POA: Diagnosis not present

## 2015-03-23 NOTE — Telephone Encounter (Signed)
TC from patient regarding her tamoxifen. She was asking if her cancer was back and that is why she has to continue to take this. Explained to her that from Dr. Calton Dach last note , pt's has no evidence of reocurrance and that the treatment plan calls for her to continue the Tamoxifen until January of 2017. Explained that this a normal course of treatment. Pt voiced undertsanding. Informed her that her next appt with Dr. Alvy Bimler is 12/09/2015. Pt. Verbalized understanding. No further issues identified.

## 2015-03-25 DIAGNOSIS — I5022 Chronic systolic (congestive) heart failure: Secondary | ICD-10-CM | POA: Diagnosis not present

## 2015-03-25 DIAGNOSIS — E119 Type 2 diabetes mellitus without complications: Secondary | ICD-10-CM | POA: Diagnosis not present

## 2015-03-25 DIAGNOSIS — I2510 Atherosclerotic heart disease of native coronary artery without angina pectoris: Secondary | ICD-10-CM | POA: Diagnosis not present

## 2015-03-25 DIAGNOSIS — I10 Essential (primary) hypertension: Secondary | ICD-10-CM | POA: Diagnosis not present

## 2015-03-25 DIAGNOSIS — J45909 Unspecified asthma, uncomplicated: Secondary | ICD-10-CM | POA: Diagnosis not present

## 2015-03-25 DIAGNOSIS — M5030 Other cervical disc degeneration, unspecified cervical region: Secondary | ICD-10-CM | POA: Diagnosis not present

## 2015-03-26 ENCOUNTER — Other Ambulatory Visit: Payer: Self-pay | Admitting: Family Medicine

## 2015-03-31 NOTE — Progress Notes (Signed)
Received electronic message of a refill error due to duplicate request. Noted that pt. Has 2 MRN's through [facility].  Noted pt's PCP had ordered Gabapentin  300 mg BID; # 60; RF x 1 on 03/26/15, and also discontinued Gabapentin Rx on same date.  Called pt's pharmacy; [facility] on [address]  spoke with "Manus Gunning, [place]. Tech"; confirmed that pt. did receive the Gabapentin on 5/13 as prescribed per PCP.  No other orders or action needed at this time.

## 2015-04-01 ENCOUNTER — Encounter (HOSPITAL_COMMUNITY): Payer: Self-pay | Admitting: Emergency Medicine

## 2015-04-01 ENCOUNTER — Emergency Department (HOSPITAL_COMMUNITY): Payer: Medicare Other

## 2015-04-01 ENCOUNTER — Emergency Department (HOSPITAL_COMMUNITY)
Admission: EM | Admit: 2015-04-01 | Discharge: 2015-04-01 | Disposition: A | Discharge status: Home / Self Care | Payer: Medicare Other | Attending: Emergency Medicine | Admitting: Emergency Medicine

## 2015-04-01 DIAGNOSIS — I2510 Atherosclerotic heart disease of native coronary artery without angina pectoris: Secondary | ICD-10-CM | POA: Insufficient documentation

## 2015-04-01 DIAGNOSIS — Z853 Personal history of malignant neoplasm of breast: Secondary | ICD-10-CM | POA: Insufficient documentation

## 2015-04-01 DIAGNOSIS — I252 Old myocardial infarction: Secondary | ICD-10-CM | POA: Diagnosis not present

## 2015-04-01 DIAGNOSIS — G629 Polyneuropathy, unspecified: Secondary | ICD-10-CM | POA: Diagnosis not present

## 2015-04-01 DIAGNOSIS — I10 Essential (primary) hypertension: Secondary | ICD-10-CM | POA: Diagnosis not present

## 2015-04-01 DIAGNOSIS — Z9889 Other specified postprocedural states: Secondary | ICD-10-CM | POA: Insufficient documentation

## 2015-04-01 DIAGNOSIS — R079 Chest pain, unspecified: Secondary | ICD-10-CM

## 2015-04-01 DIAGNOSIS — E119 Type 2 diabetes mellitus without complications: Secondary | ICD-10-CM | POA: Insufficient documentation

## 2015-04-01 DIAGNOSIS — E785 Hyperlipidemia, unspecified: Secondary | ICD-10-CM | POA: Insufficient documentation

## 2015-04-01 DIAGNOSIS — R0789 Other chest pain: Secondary | ICD-10-CM | POA: Diagnosis not present

## 2015-04-01 DIAGNOSIS — I509 Heart failure, unspecified: Secondary | ICD-10-CM | POA: Insufficient documentation

## 2015-04-01 DIAGNOSIS — B9689 Other specified bacterial agents as the cause of diseases classified elsewhere: Secondary | ICD-10-CM | POA: Diagnosis not present

## 2015-04-01 DIAGNOSIS — D649 Anemia, unspecified: Secondary | ICD-10-CM | POA: Insufficient documentation

## 2015-04-01 DIAGNOSIS — K219 Gastro-esophageal reflux disease without esophagitis: Secondary | ICD-10-CM | POA: Insufficient documentation

## 2015-04-01 DIAGNOSIS — N3000 Acute cystitis without hematuria: Secondary | ICD-10-CM | POA: Diagnosis not present

## 2015-04-01 DIAGNOSIS — Z8701 Personal history of pneumonia (recurrent): Secondary | ICD-10-CM | POA: Insufficient documentation

## 2015-04-01 DIAGNOSIS — Z85038 Personal history of other malignant neoplasm of large intestine: Secondary | ICD-10-CM | POA: Diagnosis not present

## 2015-04-01 DIAGNOSIS — M1990 Unspecified osteoarthritis, unspecified site: Secondary | ICD-10-CM | POA: Diagnosis not present

## 2015-04-01 DIAGNOSIS — J45909 Unspecified asthma, uncomplicated: Secondary | ICD-10-CM | POA: Diagnosis not present

## 2015-04-01 LAB — BASIC METABOLIC PANEL
Anion gap: 9 (ref 5–15)
BUN: 13 mg/dL (ref 6–20)
CHLORIDE: 109 mmol/L (ref 101–111)
CO2: 22 mmol/L (ref 22–32)
CREATININE: 0.95 mg/dL (ref 0.44–1.00)
Calcium: 10.7 mg/dL — ABNORMAL HIGH (ref 8.9–10.3)
GFR calc non Af Amer: 52 mL/min — ABNORMAL LOW (ref >60)
GFR, EST AFRICAN AMERICAN: 60 mL/min — AB (ref >60)
Glucose, Bld: 101 mg/dL — ABNORMAL HIGH (ref 65–99)
Potassium: 3.8 mmol/L (ref 3.5–5.1)
Sodium: 140 mmol/L (ref 135–145)

## 2015-04-01 LAB — CBC WITH DIFFERENTIAL/PLATELET
Basophils Absolute: 0 10*3/uL (ref 0.0–0.1)
Basophils Relative: 0 % (ref 0–1)
Eosinophils Absolute: 0.2 10*3/uL (ref 0.0–0.7)
Eosinophils Relative: 4 % (ref 0–5)
HCT: 29.3 % — ABNORMAL LOW (ref 36.0–46.0)
HEMOGLOBIN: 9.4 g/dL — AB (ref 12.0–15.0)
Lymphocytes Relative: 36 % (ref 12–46)
Lymphs Abs: 1.5 10*3/uL (ref 0.7–4.0)
MCH: 22.7 pg — ABNORMAL LOW (ref 26.0–34.0)
MCHC: 32.1 g/dL (ref 30.0–36.0)
MCV: 70.6 fL — ABNORMAL LOW (ref 78.0–100.0)
MONOS PCT: 11 % (ref 3–12)
Monocytes Absolute: 0.5 10*3/uL (ref 0.1–1.0)
NEUTROS PCT: 50 % (ref 43–77)
Neutro Abs: 2.1 10*3/uL (ref 1.7–7.7)
PLATELETS: 171 10*3/uL (ref 150–400)
RBC: 4.15 MIL/uL (ref 3.87–5.11)
RDW: 16.2 % — ABNORMAL HIGH (ref 11.5–15.5)
WBC: 4.1 10*3/uL (ref 4.0–10.5)

## 2015-04-01 LAB — URINE MICROSCOPIC-ADD ON

## 2015-04-01 LAB — URINALYSIS, ROUTINE W REFLEX MICROSCOPIC
BILIRUBIN URINE: NEGATIVE
GLUCOSE, UA: NEGATIVE mg/dL
Hgb urine dipstick: NEGATIVE
Ketones, ur: NEGATIVE mg/dL
NITRITE: NEGATIVE
PH: 7 (ref 5.0–8.0)
Protein, ur: NEGATIVE mg/dL
SPECIFIC GRAVITY, URINE: 1.01 (ref 1.005–1.030)
Urobilinogen, UA: 1 mg/dL (ref 0.0–1.0)

## 2015-04-01 LAB — I-STAT TROPONIN, ED: Troponin i, poc: 0 ng/mL (ref 0.00–0.08)

## 2015-04-01 MED ORDER — ASPIRIN 325 MG PO TABS: 325.0000 mg | ORAL_TABLET | Freq: Once | ORAL | Status: DC

## 2015-04-01 MED ORDER — NITROGLYCERIN 0.4 MG SL SUBL: 0.4000 mg | SUBLINGUAL_TABLET | SUBLINGUAL | Status: DC | PRN

## 2015-04-01 MED ORDER — CEPHALEXIN 500 MG PO CAPS: 500.0000 mg | ORAL_CAPSULE | Freq: Two times a day (BID) | ORAL | Status: DC

## 2015-04-01 NOTE — ED Notes (Signed)
Pt arrives from home via [facility], reports CP intermittent since Tuesday night, worsening last night.  Pt reports hx MI in 2006, double mastectomy 2012.   Pt endorses dizziness and nausea, denies radiating pain, LOC, lightheadedness, SOB.  Resp e/u, AOx4.

## 2015-04-01 NOTE — Discharge Instructions (Signed)
Chest Pain (Nonspecific) °It is often hard to give a specific diagnosis for the cause of chest pain. There is always a chance that your pain could be related to something serious, such as a heart attack or a blood clot in the lungs. You need to follow up with your health care provider for further evaluation. °CAUSES  °· Heartburn. °· Pneumonia or bronchitis. °· Anxiety or stress. °· Inflammation around your heart (pericarditis) or lung (pleuritis or pleurisy). °· A blood clot in the lung. °· A collapsed lung (pneumothorax). It can develop suddenly on its own (spontaneous pneumothorax) or from trauma to the chest. °· Shingles infection (herpes zoster virus). °The chest wall is composed of bones, muscles, and cartilage. Any of these can be the source of the pain. °· The bones can be bruised by injury. °· The muscles or cartilage can be strained by coughing or overwork. °· The cartilage can be affected by inflammation and become sore (costochondritis). °DIAGNOSIS  °Lab tests or other studies may be needed to find the cause of your pain. Your health care provider may have you take a test called an ambulatory electrocardiogram (ECG). An ECG records your heartbeat patterns over a 24-hour period. You may also have other tests, such as: °· Transthoracic echocardiogram (TTE). During echocardiography, sound waves are used to evaluate how blood flows through your heart. °· Transesophageal echocardiogram (TEE). °· Cardiac monitoring. This allows your health care provider to monitor your heart rate and rhythm in real time. °· Holter monitor. This is a portable device that records your heartbeat and can help diagnose heart arrhythmias. It allows your health care provider to track your heart activity for several days, if needed. °· Stress tests by exercise or by giving medicine that makes the heart beat faster. °TREATMENT  °· Treatment depends on what may be causing your chest pain. Treatment may include: °· Acid blockers for  heartburn. °· Anti-inflammatory medicine. °· Pain medicine for inflammatory conditions. °· Antibiotics if an infection is present. °· You may be advised to change lifestyle habits. This includes stopping smoking and avoiding alcohol, caffeine, and chocolate. °· You may be advised to keep your head raised (elevated) when sleeping. This reduces the chance of acid going backward from your stomach into your esophagus. °Most of the time, nonspecific chest pain will improve within 2-3 days with rest and mild pain medicine.  °HOME CARE INSTRUCTIONS  °· If antibiotics were prescribed, take them as directed. Finish them even if you start to feel better. °· For the next few days, avoid physical activities that bring on chest pain. Continue physical activities as directed. °· Do not use any tobacco products, including cigarettes, chewing tobacco, or electronic cigarettes. °· Avoid drinking alcohol. °· Only take medicine as directed by your health care provider. °· Follow your health care provider's suggestions for further testing if your chest pain does not go away. °· Keep any follow-up appointments you made. If you do not go to an appointment, you could develop lasting (chronic) problems with pain. If there is any problem keeping an appointment, call to reschedule. °SEEK MEDICAL CARE IF:  °· Your chest pain does not go away, even after treatment. °· You have a rash with blisters on your chest. °· You have a fever. °SEEK IMMEDIATE MEDICAL CARE IF:  °· You have increased chest pain or pain that spreads to your arm, neck, jaw, back, or abdomen. °· You have shortness of breath. °· You have an increasing cough, or you cough   up blood.  You have severe back or abdominal pain.  You feel nauseous or vomit.  You have severe weakness.  You faint.  You have chills. This is an emergency. Do not wait to see if the pain will go away. Get medical help at once. Call your local emergency services (911 in [place] Do not drive  yourself to the hospital. MAKE SURE YOU:   Understand these instructions.  Will watch your condition.  Will get help right away if you are not doing well or get worse. Document Released: 08/09/2005 Document Revised: 11/04/2013 Document Reviewed: 06/04/2008 [facility] Patient Information 2015 [place], [place]. This information is not intended to replace advice given to you by your health care provider. Make sure you discuss any questions you have with your health care provider.  Urinary Tract Infection Urinary tract infections (UTIs) can develop anywhere along your urinary tract. Your urinary tract is your body's drainage system for removing wastes and extra water. Your urinary tract includes two kidneys, two ureters, a bladder, and a urethra. Your kidneys are a pair of bean-shaped organs. Each kidney is about the size of your fist. They are located below your ribs, one on each side of your spine. CAUSES Infections are caused by microbes, which are microscopic organisms, including fungi, viruses, and bacteria. These organisms are so small that they can only be seen through a microscope. Bacteria are the microbes that most commonly cause UTIs. SYMPTOMS  Symptoms of UTIs may vary by age and gender of the patient and by the location of the infection. Symptoms in young women typically include a frequent and intense urge to urinate and a painful, burning feeling in the bladder or urethra during urination. Older women and men are more likely to be tired, shaky, and weak and have muscle aches and abdominal pain. A fever may mean the infection is in your kidneys. Other symptoms of a kidney infection include pain in your back or sides below the ribs, nausea, and vomiting. DIAGNOSIS To diagnose a UTI, your caregiver will ask you about your symptoms. Your caregiver also will ask to provide a urine sample. The urine sample will be tested for bacteria and white blood cells. White blood cells are made by your body  to help fight infection. TREATMENT  Typically, UTIs can be treated with medication. Because most UTIs are caused by a bacterial infection, they usually can be treated with the use of antibiotics. The choice of antibiotic and length of treatment depend on your symptoms and the type of bacteria causing your infection. HOME CARE INSTRUCTIONS  If you were prescribed antibiotics, take them exactly as your caregiver instructs you. Finish the medication even if you feel better after you have only taken some of the medication.  Drink enough water and fluids to keep your urine clear or pale yellow.  Avoid caffeine, tea, and carbonated beverages. They tend to irritate your bladder.  Empty your bladder often. Avoid holding urine for long periods of time.  Empty your bladder before and after sexual intercourse.  After a bowel movement, women should cleanse from front to back. Use each tissue only once. SEEK MEDICAL CARE IF:   You have back pain.  You develop a fever.  Your symptoms do not begin to resolve within 3 days. SEEK IMMEDIATE MEDICAL CARE IF:   You have severe back pain or lower abdominal pain.  You develop chills.  You have nausea or vomiting.  You have continued burning or discomfort with urination. MAKE SURE  YOU:   Understand these instructions.  Will watch your condition.  Will get help right away if you are not doing well or get worse. Document Released: 08/09/2005 Document Revised: 04/30/2012 Document Reviewed: 12/08/2011 [facility] Patient Information 2015 [place], [place]. This information is not intended to replace advice given to you by your health care provider. Make sure you discuss any questions you have with your health care provider.

## 2015-04-01 NOTE — ED Provider Notes (Signed)
CSN: [id number]     Arrival date & time 04/01/15  0620 History   First MD Initiated Contact with Patient 04/01/15 [phone]     Chief Complaint  Patient presents with  . Chest Pain     (Consider location/radiation/quality/duration/timing/severity/associated sxs/prior Treatment) Patient is a 81 y.o. female presenting with chest pain. The history is provided by the patient.  Chest Pain Associated symptoms: cough   Associated symptoms: no abdominal pain, no back pain, no diaphoresis and no fever    patient presents with chest pain. It is right-sided. She says it feels like the skin is burning. She had it 2 days ago and then again today. She has been coughing with minimal production. No fevers. She's been a little weak. No real shortness of breath. No swelling or legs. She states it does not feel like her previous heart pain. No swelling or legs. No dysuria. No diaphoresis. No trauma. States she was up much of the night last night coughing. It is worse with pressing on the chest.  Past Medical History  Diagnosis Date  . Breast cancer 10/2010    Invasive Ductal Carcinoma, s/p bilateral mastectomy, now on Tamoxifen. Followed by Dr Jamse Arn.   . Diabetes mellitus   . Hypertension   . Hyperlipidemia   . Anemia     Iron def anemia  . CAD (coronary artery disease)   . CHF (congestive heart failure)     Systolic   . History of colon cancer   . Neuropathy   . Allergy   . GERD (gastroesophageal reflux disease)   . Hiatal hernia   . Diverticulosis   . Asthma   . Arthritis   . H/O: GI bleed   . Breast cancer 2011    s/p Bilateral masectomy  . Shortness of breath   . Pneumonia 03/2012  . Carotid artery aneurysm 08/2010    right ICA, 5 x 31mm  . DDD (degenerative disc disease), cervical   . Colon cancer   . Colon cancer 12/11/2013  . Myocardial infarction   . Hypercalcemia 06/09/2014   Past Surgical History  Procedure Laterality Date  . Breast masectomy  10/2010    Bilateral masectomy by Dr  Lucia Gaskins s/p invasive ductal carcinoma.  . Cardiac  catherization  2007    Severe 3-vessel disease.  EF 20-25%.  . Esophagogastroduodenoscopy  2001    Esophageal Tear  . Total knee arthroplasty  2001  . Esophagogastroduodenoscopy  10/27/2012    Procedure: ESOPHAGOGASTRODUODENOSCOPY (EGD);  Surgeon: Irene Shipper, MD;  Location: [facility];  Service: Endoscopy;  Laterality: N/A;  pat  . Breast surgery    . Joint replacement Right 2001    Knee   Family History  Problem Relation Age of Onset  . Sickle cell anemia Other   . Heart disease Mother   . Heart disease Father   . Cancer Daughter 33    breast ca  . Hypertension Daughter   . Cancer Daughter 55    breast ca  . Hypertension Daughter   . Heart disease Son     Poor circulation-Left Leg  . Diabetes Daughter   . Hypertension Daughter   . Hyperlipidemia Daughter     Poor circulation- Toe amputation   History  Substance Use Topics  . Smoking status: Never Smoker   . Smokeless tobacco: Current User    Types: Snuff  . Alcohol Use: No   OB History    No data available     Review of  Systems  Constitutional: Negative for fever, diaphoresis and appetite change.  Respiratory: Positive for cough.   Cardiovascular: Positive for chest pain.  Gastrointestinal: Negative for abdominal pain.  Genitourinary: Negative for flank pain.  Musculoskeletal: Negative for back pain.  Skin: Negative for wound.  Neurological: Negative for light-headedness.  Psychiatric/Behavioral: Negative for confusion.      Allergies  Peanuts and Lisinopril  Home Medications   Prior to Admission medications   Medication Sig Start Date End Date Taking? Authorizing Provider  amLODipine (NORVASC) 5 MG tablet Take 1 tablet (5 mg total) by mouth daily. 02/26/15  Yes [name], MD  aspirin (ASPIRIN CHILDRENS) 81 MG chewable tablet Chew 81 mg by mouth daily.     Yes Historical Provider, MD  carvedilol (COREG) 12.5 MG tablet Take 1 tablet (12.5 mg  total) by mouth 2 (two) times daily with a meal. 03/04/15  Yes Sharon [address], MD  clopidogrel (PLAVIX) 75 MG tablet TAKE 1 TABLET (75 MG TOTAL) BY MOUTH DAILY. 02/22/15  Yes [name], MD  cyclobenzaprine (FLEXERIL) 5 MG tablet Take 0.5-1 tablets (2.5-5 mg total) by mouth 3 (three) times daily as needed for muscle spasms. 11/26/14  Yes Alexander J Karamalegos, DO  gabapentin (NEURONTIN) 300 MG capsule TAKE ONE CAPSULE BY MOUTH TWICE A DAY 03/26/15  Yes Virginia Crews, MD  losartan (COZAAR) 50 MG tablet TAKE 1 TABLET (50 MG TOTAL) BY MOUTH AT BEDTIME. 03/18/15  Yes [name], MD  magnesium hydroxide (MILK OF MAGNESIA) 400 MG/5ML suspension Take 30 mLs by mouth daily as needed for mild constipation.   Yes Historical Provider, MD  metFORMIN (GLUCOPHAGE) 500 MG tablet TAKE 2 TABLETS (1,000 MG TOTAL) BY MOUTH 2 (TWO) TIMES DAILY WITH A MEAL. 10/30/14  Yes [name], MD  mirtazapine (REMERON) 15 MG tablet Take 15 mg by mouth at bedtime as needed (sleep).   Yes Historical Provider, MD  ondansetron (ZOFRAN ODT) 4 MG disintegrating tablet Take 1 tablet (4 mg total) by mouth every 8 (eight) hours as needed for nausea or vomiting. 02/09/15  Yes [name], MD  ranitidine (ZANTAC) 150 MG capsule TAKE ONE CAPSULE BY MOUTH TWICE A DAY AS NEEDED FOR HEARTBURN 03/16/15  Yes [name], MD  rosuvastatin (CRESTOR) 20 MG tablet Take 1 tablet (20 mg total) by mouth at bedtime. 02/08/15  Yes [name], MD  tamoxifen (NOLVADEX) 20 MG tablet Take 20 mg by mouth daily.  03/20/15  Yes Historical Provider, MD  traMADol (ULTRAM) 50 MG tablet Take 1 tablet (50 mg total) by mouth every 12 (twelve) hours as needed for severe pain. 03/16/15  Yes Crystal Libby Maw, MD  VOLTAREN 1 % GEL APPLY 2 G TOPICALLY 4 (FOUR) TIMES DAILY. 03/15/15  Yes [name], MD  albuterol (PROVENTIL HFA;VENTOLIN HFA) 108 (90 BASE) MCG/ACT inhaler Inhale 2 puffs into the lungs every 6 (six) hours as needed. For  shortness of breath 01/18/15   [name], MD  cephALEXin (KEFLEX) 500 MG capsule Take 1 capsule (500 mg total) by mouth 2 (two) times daily. 04/01/15   Davonna Belling, MD  ferrous sulfate 325 (65 FE) MG tablet Take 1 tablet (325 mg total) by mouth daily with breakfast. Patient not taking: Reported on 04/01/2015 01/27/15   Katheren Shams, DO  furosemide (LASIX) 20 MG tablet Take 0.5 tablets (10 mg total) by mouth daily as needed for fluid or edema. Patient not taking: Reported on 04/01/2015 01/27/15  Katheren Shams, DO  glucose blood test strip Use as instructed Dx. Code 250.02 09/28/14   [name], MD  loratadine (CLARITIN) 10 MG tablet Take 0.5 tablets (5 mg total) by mouth daily. Patient not taking: Reported on 04/01/2015 02/09/15   [name], MD  ondansetron (ZOFRAN) 4 MG tablet Take 1 tablet (4 mg total) by mouth every 8 (eight) hours as needed for nausea or vomiting. Patient not taking: Reported on 04/01/2015 08/19/14   Olam Idler, MD   BP 129/54 mmHg  Pulse 77  Temp(Src) 98.3 F (36.8 C)  Resp 14  Ht 5\' 4"  (1.626 m)  Wt 114 lb (51.71 kg)  BMI 19.56 kg/m2  SpO2 99% Physical Exam  Constitutional: She is oriented to person, place, and time. She appears well-developed and well-nourished.  HENT:  Head: Normocephalic and atraumatic.  Neck: Normal range of motion.  Cardiovascular: Normal rate, regular rhythm and normal heart sounds.   No murmur heard. Pulmonary/Chest: Effort normal. She has no wheezes. She has no rales. She exhibits tenderness.  Tenderness over right anterior chest wall more than left side there is some tenderness on both sides. Crepitance or deformity.  Abdominal: Soft. Bowel sounds are normal. She exhibits no distension. There is no tenderness. There is no rebound and no guarding.  Musculoskeletal: Normal range of motion.  Neurological: She is alert and oriented to person, place, and time.  Skin: Skin is warm and dry.  Psychiatric: Her speech is  normal.  Nursing note and vitals reviewed.   ED Course  Procedures (including critical care time) Labs Review Labs Reviewed  CBC WITH DIFFERENTIAL/PLATELET - Abnormal; Notable for the following:    Hemoglobin 9.4 (*)    HCT 29.3 (*)    MCV 70.6 (*)    MCH 22.7 (*)    RDW 16.2 (*)    All other components within normal limits  BASIC METABOLIC PANEL - Abnormal; Notable for the following:    Glucose, Bld 101 (*)    Calcium 10.7 (*)    GFR calc non Af Amer 52 (*)    GFR calc Af Amer 60 (*)    All other components within normal limits  URINALYSIS, ROUTINE W REFLEX MICROSCOPIC - Abnormal; Notable for the following:    Color, Urine AMBER (*)    APPearance CLOUDY (*)    Leukocytes, UA TRACE (*)    All other components within normal limits  URINE MICROSCOPIC-ADD ON - Abnormal; Notable for the following:    Bacteria, UA MANY (*)    Casts GRANULAR CAST (*)    Crystals CA OXALATE CRYSTALS (*)    All other components within normal limits  I-STAT TROPOININ, ED    Imaging Review Dg Chest 2 View  04/01/2015   CLINICAL DATA:  Upper right chest pain for 2 days  EXAM: CHEST  2 VIEW  COMPARISON:  01/26/2015  FINDINGS: Cardiac shadow is stable. The lungs are well aerated bilaterally. Calcified granuloma is again noted in the right apex. Eventration of the right hemidiaphragm is noted. Mild tortuosity of the thoracic aorta is again seen. Calcification of the aorta is noted.  IMPRESSION: No acute abnormality noted.   Electronically Signed   By: Inez Catalina M.D.   On: 04/01/2015 08:01     EKG Interpretation   Date/Time:  Thursday Apr 01 2015 06:24:26 EDT Ventricular Rate:  63 PR Interval:  179 QRS Duration: 72 QT Interval:  428 QTC Calculation: 438 R Axis:   9  Text Interpretation:  Sinus rhythm Atrial premature complex No significant  change since last tracing Confirmed by Glynn Octave [phone]) on  04/01/2015 6:30:09 AM      MDM   Final diagnoses:  Chest pain, unspecified  chest pain type  Acute cystitis without hematuria    Patient with chest pain. Right-sided and reproducible. Has been coughing. Doubt cardiac cause although she has known coronary artery disease. Also has had some weakness. Found a possible urinary tract infection. Culture sent and started on antibiotics.    Davonna Belling, MD 04/01/15 1011

## 2015-04-01 NOTE — ED Notes (Signed)
Waiting on family to bring pt clothing.

## 2015-04-06 ENCOUNTER — Other Ambulatory Visit: Payer: Self-pay | Admitting: Family Medicine

## 2015-04-06 ENCOUNTER — Encounter (HOSPITAL_COMMUNITY): Payer: Self-pay

## 2015-04-06 ENCOUNTER — Other Ambulatory Visit (HOSPITAL_COMMUNITY): Payer: Self-pay

## 2015-04-06 ENCOUNTER — Emergency Department (HOSPITAL_COMMUNITY): Payer: Medicare Other

## 2015-04-06 ENCOUNTER — Inpatient Hospital Stay (HOSPITAL_COMMUNITY)
Admission: EM | Admit: 2015-04-06 | Discharge: 2015-04-09 | DRG: 689 | Disposition: A | Discharge status: Home / Self Care | Payer: Medicare Other | Attending: Internal Medicine | Admitting: Internal Medicine

## 2015-04-06 DIAGNOSIS — R5383 Other fatigue: Secondary | ICD-10-CM | POA: Diagnosis not present

## 2015-04-06 DIAGNOSIS — G9340 Encephalopathy, unspecified: Secondary | ICD-10-CM | POA: Diagnosis not present

## 2015-04-06 DIAGNOSIS — Z85038 Personal history of other malignant neoplasm of large intestine: Secondary | ICD-10-CM

## 2015-04-06 DIAGNOSIS — F17220 Nicotine dependence, chewing tobacco, uncomplicated: Secondary | ICD-10-CM | POA: Diagnosis present

## 2015-04-06 DIAGNOSIS — I2510 Atherosclerotic heart disease of native coronary artery without angina pectoris: Secondary | ICD-10-CM | POA: Diagnosis present

## 2015-04-06 DIAGNOSIS — N390 Urinary tract infection, site not specified: Principal | ICD-10-CM | POA: Diagnosis present

## 2015-04-06 DIAGNOSIS — R410 Disorientation, unspecified: Secondary | ICD-10-CM

## 2015-04-06 DIAGNOSIS — G629 Polyneuropathy, unspecified: Secondary | ICD-10-CM | POA: Diagnosis present

## 2015-04-06 DIAGNOSIS — Z853 Personal history of malignant neoplasm of breast: Secondary | ICD-10-CM

## 2015-04-06 DIAGNOSIS — E119 Type 2 diabetes mellitus without complications: Secondary | ICD-10-CM | POA: Diagnosis not present

## 2015-04-06 DIAGNOSIS — J9811 Atelectasis: Secondary | ICD-10-CM | POA: Diagnosis not present

## 2015-04-06 DIAGNOSIS — I10 Essential (primary) hypertension: Secondary | ICD-10-CM | POA: Insufficient documentation

## 2015-04-06 DIAGNOSIS — A419 Sepsis, unspecified organism: Secondary | ICD-10-CM

## 2015-04-06 DIAGNOSIS — E1140 Type 2 diabetes mellitus with diabetic neuropathy, unspecified: Secondary | ICD-10-CM

## 2015-04-06 DIAGNOSIS — R531 Weakness: Secondary | ICD-10-CM | POA: Diagnosis not present

## 2015-04-06 DIAGNOSIS — R509 Fever, unspecified: Secondary | ICD-10-CM

## 2015-04-06 DIAGNOSIS — I5022 Chronic systolic (congestive) heart failure: Secondary | ICD-10-CM | POA: Diagnosis not present

## 2015-04-06 DIAGNOSIS — Z87440 Personal history of urinary (tract) infections: Secondary | ICD-10-CM

## 2015-04-06 DIAGNOSIS — E785 Hyperlipidemia, unspecified: Secondary | ICD-10-CM | POA: Diagnosis present

## 2015-04-06 DIAGNOSIS — E1142 Type 2 diabetes mellitus with diabetic polyneuropathy: Secondary | ICD-10-CM | POA: Diagnosis present

## 2015-04-06 DIAGNOSIS — R404 Transient alteration of awareness: Secondary | ICD-10-CM | POA: Diagnosis not present

## 2015-04-06 DIAGNOSIS — R4182 Altered mental status, unspecified: Secondary | ICD-10-CM | POA: Diagnosis not present

## 2015-04-06 LAB — URINALYSIS, ROUTINE W REFLEX MICROSCOPIC
Bilirubin Urine: NEGATIVE
Glucose, UA: NEGATIVE mg/dL
HGB URINE DIPSTICK: NEGATIVE
Ketones, ur: NEGATIVE mg/dL
Leukocytes, UA: NEGATIVE
Nitrite: NEGATIVE
PH: 6 (ref 5.0–8.0)
PROTEIN: 30 mg/dL — AB
SPECIFIC GRAVITY, URINE: 1.021 (ref 1.005–1.030)
Urobilinogen, UA: 1 mg/dL (ref 0.0–1.0)

## 2015-04-06 LAB — CBC WITH DIFFERENTIAL/PLATELET
Basophils Absolute: 0 10*3/uL (ref 0.0–0.1)
Basophils Relative: 0 % (ref 0–1)
Eosinophils Absolute: 0.1 10*3/uL (ref 0.0–0.7)
Eosinophils Relative: 1 % (ref 0–5)
HCT: 29.4 % — ABNORMAL LOW (ref 36.0–46.0)
Hemoglobin: 9.1 g/dL — ABNORMAL LOW (ref 12.0–15.0)
LYMPHS ABS: 1.2 10*3/uL (ref 0.7–4.0)
Lymphocytes Relative: 21 % (ref 12–46)
MCH: 22.1 pg — ABNORMAL LOW (ref 26.0–34.0)
MCHC: 31 g/dL (ref 30.0–36.0)
MCV: 71.5 fL — AB (ref 78.0–100.0)
MONO ABS: 0.5 10*3/uL (ref 0.1–1.0)
MONOS PCT: 8 % (ref 3–12)
Neutro Abs: 4 10*3/uL (ref 1.7–7.7)
Neutrophils Relative %: 70 % (ref 43–77)
Platelets: 195 10*3/uL (ref 150–400)
RBC: 4.11 MIL/uL (ref 3.87–5.11)
RDW: 15.8 % — AB (ref 11.5–15.5)
WBC: 5.8 10*3/uL (ref 4.0–10.5)

## 2015-04-06 LAB — COMPREHENSIVE METABOLIC PANEL
ALT: 8 U/L — ABNORMAL LOW (ref 14–54)
AST: 13 U/L — ABNORMAL LOW (ref 15–41)
Albumin: 3.5 g/dL (ref 3.5–5.0)
Alkaline Phosphatase: 34 U/L — ABNORMAL LOW (ref 38–126)
Anion gap: 9 (ref 5–15)
BILIRUBIN TOTAL: 0.3 mg/dL (ref 0.3–1.2)
BUN: 16 mg/dL (ref 6–20)
CHLORIDE: 101 mmol/L (ref 101–111)
CO2: 24 mmol/L (ref 22–32)
Calcium: 10.1 mg/dL (ref 8.9–10.3)
Creatinine, Ser: 0.93 mg/dL (ref 0.44–1.00)
GFR, EST NON AFRICAN AMERICAN: 53 mL/min — AB (ref >60)
Glucose, Bld: 113 mg/dL — ABNORMAL HIGH (ref 65–99)
Potassium: 4.2 mmol/L (ref 3.5–5.1)
Sodium: 134 mmol/L — ABNORMAL LOW (ref 135–145)
TOTAL PROTEIN: 7.4 g/dL (ref 6.5–8.1)

## 2015-04-06 LAB — URINE MICROSCOPIC-ADD ON

## 2015-04-06 LAB — LACTIC ACID, PLASMA: LACTIC ACID, VENOUS: 0.8 mmol/L (ref 0.5–2.0)

## 2015-04-06 MED ORDER — ACETAMINOPHEN 325 MG PO TABS
650.0000 mg | ORAL_TABLET | Freq: Once | ORAL | Status: AC
  Administered 2015-04-06: 650 mg via ORAL
  Filled 2015-04-06: qty 2

## 2015-04-06 NOTE — ED Notes (Signed)
CBG 107 per EMS

## 2015-04-06 NOTE — ED Notes (Signed)
Able to collect all blood work except second blood culture. RN is aware, will attempt to collect with second IV start

## 2015-04-06 NOTE — ED Notes (Signed)
Bed: WA07 Expected date:  Expected time:  Means of arrival:  Comments: EMS 81 yo female with UTI/weakness

## 2015-04-06 NOTE — ED Notes (Signed)
Patient arrives by EMS with complaints of UTI diagnosed 3 days ago, family states generalized weakness, lethargy. "not acting like herself"

## 2015-04-06 NOTE — ED Provider Notes (Signed)
CSN: [id number]     Arrival date & time 04/06/15  2017 History   First MD Initiated Contact with Patient 04/06/15 2217     Chief Complaint  Patient presents with  . Weakness  . Fatigue  . Urinary Tract Infection     (Consider location/radiation/quality/duration/timing/severity/associated sxs/prior Treatment) HPI Comments: Patient seen on 5/19 and treated for UTI.  Patient not acting herself with weakness and lethargy.  Patient is a 81 y.o. female presenting with weakness and urinary tract infection. The history is provided by medical records. No language interpreter was used.  Weakness This is a new problem. The current episode started today. The problem occurs constantly. The problem has been gradually worsening. Associated symptoms include chills and weakness. Pertinent negatives include no abdominal pain or chest pain.  Urinary Tract Infection Associated symptoms include chills and weakness. Pertinent negatives include no abdominal pain or chest pain.    Past Medical History  Diagnosis Date  . Breast cancer 10/2010    Invasive Ductal Carcinoma, s/p bilateral mastectomy, now on Tamoxifen. Followed by Dr Jamse Arn.   . Diabetes mellitus   . Hypertension   . Hyperlipidemia   . Anemia     Iron def anemia  . CAD (coronary artery disease)   . CHF (congestive heart failure)     Systolic   . History of colon cancer   . Neuropathy   . Allergy   . GERD (gastroesophageal reflux disease)   . Hiatal hernia   . Diverticulosis   . Asthma   . Arthritis   . H/O: GI bleed   . Breast cancer 2011    s/p Bilateral masectomy  . Shortness of breath   . Pneumonia 03/2012  . Carotid artery aneurysm 08/2010    right ICA, 5 x 66mm  . DDD (degenerative disc disease), cervical   . Colon cancer   . Colon cancer 12/11/2013  . Myocardial infarction   . Hypercalcemia 06/09/2014   Past Surgical History  Procedure Laterality Date  . Breast masectomy  10/2010    Bilateral masectomy by Dr Lucia Gaskins s/p  invasive ductal carcinoma.  . Cardiac  catherization  2007    Severe 3-vessel disease.  EF 20-25%.  . Esophagogastroduodenoscopy  2001    Esophageal Tear  . Total knee arthroplasty  2001  . Esophagogastroduodenoscopy  10/27/2012    Procedure: ESOPHAGOGASTRODUODENOSCOPY (EGD);  Surgeon: Irene Shipper, MD;  Location: [facility] ENDOSCOPY;  Service: Endoscopy;  Laterality: N/A;  pat  . Breast surgery    . Joint replacement Right 2001    Knee   Family History  Problem Relation Age of Onset  . Sickle cell anemia Other   . Heart disease Mother   . Heart disease Father   . Cancer Daughter 74    breast ca  . Hypertension Daughter   . Cancer Daughter 68    breast ca  . Hypertension Daughter   . Heart disease Son     Poor circulation-Left Leg  . Diabetes Daughter   . Hypertension Daughter   . Hyperlipidemia Daughter     Poor circulation- Toe amputation   History  Substance Use Topics  . Smoking status: Never Smoker   . Smokeless tobacco: Current User    Types: Snuff  . Alcohol Use: No   OB History    No data available     Review of Systems  Unable to perform ROS: Mental status change  Constitutional: Positive for chills.  Cardiovascular: Negative for chest pain.  Gastrointestinal: Negative for abdominal pain.  Neurological: Positive for weakness.  Psychiatric/Behavioral: Positive for confusion.      Allergies  Peanuts and Lisinopril  Home Medications   Prior to Admission medications   Medication Sig Start Date End Date Taking? Authorizing Provider  albuterol (PROVENTIL HFA;VENTOLIN HFA) 108 (90 BASE) MCG/ACT inhaler Inhale 2 puffs into the lungs every 6 (six) hours as needed. For shortness of breath 01/18/15   [name], MD  amLODipine (NORVASC) 5 MG tablet Take 1 tablet (5 mg total) by mouth daily. 02/26/15   [name], MD  aspirin (ASPIRIN CHILDRENS) 81 MG chewable tablet Chew 81 mg by mouth daily.      Historical Provider, MD  carvedilol (COREG) 12.5 MG tablet  Take 1 tablet (12.5 mg total) by mouth 2 (two) times daily with a meal. 03/04/15   Sharon Mt Street, MD  cephALEXin (KEFLEX) 500 MG capsule Take 1 capsule (500 mg total) by mouth 2 (two) times daily. 04/01/15   Davonna Belling, MD  clopidogrel (PLAVIX) 75 MG tablet TAKE 1 TABLET (75 MG TOTAL) BY MOUTH DAILY. 02/22/15   [name], MD  cyclobenzaprine (FLEXERIL) 5 MG tablet Take 0.5-1 tablets (2.5-5 mg total) by mouth 3 (three) times daily as needed for muscle spasms. 11/26/14   Olin Hauser, DO  diclofenac sodium (VOLTAREN) 1 % GEL APPLY 2 GRAMS TOPICALLY 4 (FOUR) TIMES DAILY. 04/06/15   [name], MD  ferrous sulfate 325 (65 FE) MG tablet Take 1 tablet (325 mg total) by mouth daily with breakfast. Patient not taking: Reported on 04/01/2015 01/27/15   Katheren Shams, DO  furosemide (LASIX) 20 MG tablet Take 0.5 tablets (10 mg total) by mouth daily as needed for fluid or edema. Patient not taking: Reported on 04/01/2015 01/27/15   Katheren Shams, DO  gabapentin (NEURONTIN) 300 MG capsule TAKE ONE CAPSULE BY MOUTH TWICE A DAY 03/26/15   Virginia Crews, MD  glucose blood test strip Use as instructed Dx. Code 250.02 09/28/14   [name], MD  loratadine (CLARITIN) 10 MG tablet Take 0.5 tablets (5 mg total) by mouth daily. Patient not taking: Reported on 04/01/2015 02/09/15   [name], MD  losartan (COZAAR) 50 MG tablet TAKE 1 TABLET (50 MG TOTAL) BY MOUTH AT BEDTIME. 03/18/15   [name], MD  magnesium hydroxide (MILK OF MAGNESIA) 400 MG/5ML suspension Take 30 mLs by mouth daily as needed for mild constipation.    Historical Provider, MD  metFORMIN (GLUCOPHAGE) 500 MG tablet TAKE 2 TABLETS (1,000 MG TOTAL) BY MOUTH 2 (TWO) TIMES DAILY WITH A MEAL. 10/30/14   [name], MD  mirtazapine (REMERON) 15 MG tablet Take 15 mg by mouth at bedtime as needed (sleep).    Historical Provider, MD  ondansetron (ZOFRAN ODT) 4 MG disintegrating tablet Take 1 tablet (4 mg  total) by mouth every 8 (eight) hours as needed for nausea or vomiting. 02/09/15   [name], MD  ondansetron (ZOFRAN) 4 MG tablet Take 1 tablet (4 mg total) by mouth every 8 (eight) hours as needed for nausea or vomiting. Patient not taking: Reported on 04/01/2015 08/19/14   Olam Idler, MD  ranitidine (ZANTAC) 150 MG capsule TAKE ONE CAPSULE BY MOUTH TWICE A DAY AS NEEDED FOR HEARTBURN 03/16/15   [name], MD  rosuvastatin (CRESTOR) 20 MG tablet Take 1 tablet (20 mg total) by mouth at bedtime. 02/08/15   [name], MD  tamoxifen (NOLVADEX) 20 MG tablet Take 20 mg by mouth daily.  03/20/15   Historical Provider, MD  traMADol (ULTRAM) 50 MG tablet Take 1 tablet (50 mg total) by mouth every 12 (twelve) hours as needed for severe pain. 03/16/15   [name], MD   BP 168/61 mmHg  Pulse 82  Temp(Src) 102.5 F (39.2 C) (Oral)  Resp 18  SpO2 97% Physical Exam  Constitutional: She appears well-developed. She appears lethargic.  HENT:  Head: Atraumatic.  Eyes: Conjunctivae are normal.  Neck: Neck supple.  Cardiovascular: Normal rate and regular rhythm.   Pulmonary/Chest: Effort normal and breath sounds normal.  Abdominal: Soft. She exhibits no distension.  Musculoskeletal: She exhibits tenderness. She exhibits no edema.  Lymphadenopathy:    She has no cervical adenopathy.  Neurological: She appears lethargic.  She is confused to time, sluggish to respond.  Skin: Skin is warm and dry.  Nursing note and vitals reviewed.   ED Course  Procedures (including critical care time) Labs Review Labs Reviewed  COMPREHENSIVE METABOLIC PANEL - Abnormal; Notable for the following:    Sodium 134 (*)    Glucose, Bld 113 (*)    AST 13 (*)    ALT 8 (*)    Alkaline Phosphatase 34 (*)    GFR calc non Af Amer 53 (*)    All other components within normal limits  CBC WITH DIFFERENTIAL/PLATELET - Abnormal; Notable for the following:    Hemoglobin 9.1 (*)    HCT 29.4 (*)    MCV 71.5  (*)    MCH 22.1 (*)    RDW 15.8 (*)    All other components within normal limits  CULTURE, BLOOD (ROUTINE X 2)  CULTURE, BLOOD (ROUTINE X 2)  URINE CULTURE  LACTIC ACID, PLASMA  URINALYSIS, ROUTINE W REFLEX MICROSCOPIC  LACTIC ACID, PLASMA    Imaging Review Dg Chest 2 View  04/06/2015   CLINICAL DATA:  Fever  EXAM: CHEST  2 VIEW  COMPARISON:  04/01/2015  FINDINGS: Borderline cardiomegaly, accentuated by rotation. Stable aortic tortuosity.  Linear opacities in the bilateral lower lungs, subsegmental atelectasis. There is a small left pleural effusion noted laterally. No edema or pneumothorax.  IMPRESSION: Mild bibasilar atelectasis and small left pleural effusion.   Electronically Signed   By: Monte Fantasia M.D.   On: 04/06/2015 21:22     EKG Interpretation None     Patient recently seen and treated for UTI.  Returns to ED today with fever, lethargy, and confusion.  No source of fever identified (UA with trace leukocytes, CXR without pneumonia or consolidation). Not hypoglycemic.  Patient discussed with and seen by Dr. Tawnya Crook.  Admission requested with hospitalist.  MDM   Final diagnoses:  Fever    FUO. Altered mental status (confusion) with lethargy.    Etta Quill, NP 04/07/15 0981  Ernestina Patches, MD 04/07/15 1044

## 2015-04-07 ENCOUNTER — Inpatient Hospital Stay (HOSPITAL_COMMUNITY): Payer: Medicare Other

## 2015-04-07 DIAGNOSIS — I2510 Atherosclerotic heart disease of native coronary artery without angina pectoris: Secondary | ICD-10-CM | POA: Diagnosis present

## 2015-04-07 DIAGNOSIS — A419 Sepsis, unspecified organism: Secondary | ICD-10-CM | POA: Diagnosis not present

## 2015-04-07 DIAGNOSIS — E785 Hyperlipidemia, unspecified: Secondary | ICD-10-CM | POA: Insufficient documentation

## 2015-04-07 DIAGNOSIS — I5022 Chronic systolic (congestive) heart failure: Secondary | ICD-10-CM

## 2015-04-07 DIAGNOSIS — Z853 Personal history of malignant neoplasm of breast: Secondary | ICD-10-CM | POA: Diagnosis not present

## 2015-04-07 DIAGNOSIS — E119 Type 2 diabetes mellitus without complications: Secondary | ICD-10-CM | POA: Diagnosis not present

## 2015-04-07 DIAGNOSIS — R531 Weakness: Secondary | ICD-10-CM | POA: Diagnosis not present

## 2015-04-07 DIAGNOSIS — R509 Fever, unspecified: Secondary | ICD-10-CM

## 2015-04-07 DIAGNOSIS — G9340 Encephalopathy, unspecified: Secondary | ICD-10-CM | POA: Diagnosis not present

## 2015-04-07 DIAGNOSIS — F17220 Nicotine dependence, chewing tobacco, uncomplicated: Secondary | ICD-10-CM | POA: Diagnosis present

## 2015-04-07 DIAGNOSIS — G629 Polyneuropathy, unspecified: Secondary | ICD-10-CM | POA: Diagnosis present

## 2015-04-07 DIAGNOSIS — I10 Essential (primary) hypertension: Secondary | ICD-10-CM

## 2015-04-07 DIAGNOSIS — Z85038 Personal history of other malignant neoplasm of large intestine: Secondary | ICD-10-CM | POA: Diagnosis not present

## 2015-04-07 DIAGNOSIS — J9811 Atelectasis: Secondary | ICD-10-CM | POA: Diagnosis present

## 2015-04-07 DIAGNOSIS — N390 Urinary tract infection, site not specified: Principal | ICD-10-CM

## 2015-04-07 DIAGNOSIS — E1142 Type 2 diabetes mellitus with diabetic polyneuropathy: Secondary | ICD-10-CM | POA: Diagnosis present

## 2015-04-07 DIAGNOSIS — J984 Other disorders of lung: Secondary | ICD-10-CM | POA: Diagnosis not present

## 2015-04-07 DIAGNOSIS — Z87440 Personal history of urinary (tract) infections: Secondary | ICD-10-CM | POA: Diagnosis not present

## 2015-04-07 LAB — COMPREHENSIVE METABOLIC PANEL
ALK PHOS: 32 U/L — AB (ref 38–126)
ALT: 9 U/L — AB (ref 14–54)
AST: 14 U/L — ABNORMAL LOW (ref 15–41)
Albumin: 3.5 g/dL (ref 3.5–5.0)
Anion gap: 9 (ref 5–15)
BUN: 16 mg/dL (ref 6–20)
CALCIUM: 9.8 mg/dL (ref 8.9–10.3)
CHLORIDE: 104 mmol/L (ref 101–111)
CO2: 21 mmol/L — ABNORMAL LOW (ref 22–32)
CREATININE: 0.9 mg/dL (ref 0.44–1.00)
GFR calc non Af Amer: 55 mL/min — ABNORMAL LOW (ref >60)
GLUCOSE: 130 mg/dL — AB (ref 65–99)
Potassium: 3.9 mmol/L (ref 3.5–5.1)
SODIUM: 134 mmol/L — AB (ref 135–145)
Total Bilirubin: 0.3 mg/dL (ref 0.3–1.2)
Total Protein: 7.4 g/dL (ref 6.5–8.1)

## 2015-04-07 LAB — CBC WITH DIFFERENTIAL/PLATELET
Basophils Absolute: 0 10*3/uL (ref 0.0–0.1)
Basophils Relative: 0 % (ref 0–1)
EOS PCT: 1 % (ref 0–5)
Eosinophils Absolute: 0.1 10*3/uL (ref 0.0–0.7)
HCT: 28.9 % — ABNORMAL LOW (ref 36.0–46.0)
Hemoglobin: 9.3 g/dL — ABNORMAL LOW (ref 12.0–15.0)
LYMPHS ABS: 1.4 10*3/uL (ref 0.7–4.0)
LYMPHS PCT: 24 % (ref 12–46)
MCH: 22.9 pg — ABNORMAL LOW (ref 26.0–34.0)
MCHC: 32.2 g/dL (ref 30.0–36.0)
MCV: 71.2 fL — AB (ref 78.0–100.0)
MONOS PCT: 9 % (ref 3–12)
Monocytes Absolute: 0.5 10*3/uL (ref 0.1–1.0)
Neutro Abs: 3.8 10*3/uL (ref 1.7–7.7)
Neutrophils Relative %: 66 % (ref 43–77)
Platelets: 152 10*3/uL (ref 150–400)
RBC: 4.06 MIL/uL (ref 3.87–5.11)
RDW: 15.9 % — ABNORMAL HIGH (ref 11.5–15.5)
WBC: 5.7 10*3/uL (ref 4.0–10.5)

## 2015-04-07 LAB — URINALYSIS, ROUTINE W REFLEX MICROSCOPIC
Bilirubin Urine: NEGATIVE
Glucose, UA: NEGATIVE mg/dL
HGB URINE DIPSTICK: NEGATIVE
KETONES UR: NEGATIVE mg/dL
Leukocytes, UA: NEGATIVE
Nitrite: NEGATIVE
PROTEIN: NEGATIVE mg/dL
Specific Gravity, Urine: 1.007 (ref 1.005–1.030)
Urobilinogen, UA: 0.2 mg/dL (ref 0.0–1.0)
pH: 6 (ref 5.0–8.0)

## 2015-04-07 LAB — TSH: TSH: 3.56 u[IU]/mL (ref 0.350–4.500)

## 2015-04-07 LAB — APTT: aPTT: 23 seconds — ABNORMAL LOW (ref 24–37)

## 2015-04-07 LAB — LACTIC ACID, PLASMA
LACTIC ACID, VENOUS: 1.1 mmol/L (ref 0.5–2.0)
Lactic Acid, Venous: 1.1 mmol/L (ref 0.5–2.0)

## 2015-04-07 LAB — PROTIME-INR
INR: 1.19 (ref 0.00–1.49)
Prothrombin Time: 15.3 seconds — ABNORMAL HIGH (ref 11.6–15.2)

## 2015-04-07 LAB — PROCALCITONIN: Procalcitonin: <0.1 ng/mL

## 2015-04-07 MED ORDER — ACETAMINOPHEN 325 MG PO TABS
650.0000 mg | ORAL_TABLET | Freq: Four times a day (QID) | ORAL | Status: DC | PRN
  Administered 2015-04-09: 650 mg via ORAL
  Filled 2015-04-07: qty 2

## 2015-04-07 MED ORDER — LOSARTAN POTASSIUM 50 MG PO TABS
50.0000 mg | ORAL_TABLET | Freq: Every day | ORAL | Status: DC
  Administered 2015-04-07 – 2015-04-09 (×3): 50 mg via ORAL
  Filled 2015-04-07 (×3): qty 1

## 2015-04-07 MED ORDER — AMLODIPINE BESYLATE 5 MG PO TABS
5.0000 mg | ORAL_TABLET | Freq: Every day | ORAL | Status: DC
  Administered 2015-04-07 – 2015-04-09 (×3): 5 mg via ORAL
  Filled 2015-04-07 (×3): qty 1

## 2015-04-07 MED ORDER — CEFTRIAXONE SODIUM IN DEXTROSE 40 MG/ML IV SOLN
2.0000 g | Freq: Once | INTRAVENOUS | Status: AC
  Administered 2015-04-07: 2 g via INTRAVENOUS
  Filled 2015-04-07: qty 50

## 2015-04-07 MED ORDER — ALBUTEROL SULFATE (2.5 MG/3ML) 0.083% IN NEBU: 3.0000 mL | INHALATION_SOLUTION | Freq: Four times a day (QID) | RESPIRATORY_TRACT | Status: DC | PRN

## 2015-04-07 MED ORDER — TAMOXIFEN CITRATE 10 MG PO TABS
20.0000 mg | ORAL_TABLET | Freq: Every day | ORAL | Status: DC
Start: 2015-04-07 — End: 2015-04-09
  Administered 2015-04-07 – 2015-04-09 (×3): 20 mg via ORAL
  Filled 2015-04-07 (×4): qty 2

## 2015-04-07 MED ORDER — METFORMIN HCL 500 MG PO TABS
500.0000 mg | ORAL_TABLET | Freq: Two times a day (BID) | ORAL | Status: DC
  Administered 2015-04-07 – 2015-04-09 (×5): 500 mg via ORAL
  Filled 2015-04-07 (×7): qty 1

## 2015-04-07 MED ORDER — CEFTRIAXONE SODIUM IN DEXTROSE 20 MG/ML IV SOLN
1.0000 g | INTRAVENOUS | Status: DC
  Administered 2015-04-08 – 2015-04-09 (×2): 1 g via INTRAVENOUS
  Filled 2015-04-07 (×2): qty 50

## 2015-04-07 MED ORDER — HEPARIN SODIUM (PORCINE) 5000 UNIT/ML IJ SOLN
5000.0000 [IU] | Freq: Three times a day (TID) | INTRAMUSCULAR | Status: DC
  Administered 2015-04-07 – 2015-04-09 (×8): 5000 [IU] via SUBCUTANEOUS
  Filled 2015-04-07 (×11): qty 1

## 2015-04-07 MED ORDER — DICLOFENAC SODIUM 1 % TD GEL
2.0000 g | Freq: Four times a day (QID) | TRANSDERMAL | Status: DC
  Administered 2015-04-07 – 2015-04-08 (×4): 2 g via TOPICAL
  Filled 2015-04-07: qty 100

## 2015-04-07 MED ORDER — FOLIC ACID 1 MG PO TABS
1.0000 mg | ORAL_TABLET | Freq: Every day | ORAL | Status: DC
Start: 2015-04-07 — End: 2015-04-07
  Administered 2015-04-07: 1 mg via ORAL
  Filled 2015-04-07: qty 1

## 2015-04-07 MED ORDER — TRAMADOL HCL 50 MG PO TABS
50.0000 mg | ORAL_TABLET | Freq: Two times a day (BID) | ORAL | Status: DC | PRN
  Administered 2015-04-07 – 2015-04-09 (×2): 50 mg via ORAL
  Filled 2015-04-07 (×2): qty 1

## 2015-04-07 MED ORDER — SODIUM CHLORIDE 0.9 % IV SOLN
INTRAVENOUS | Status: DC
  Administered 2015-04-07 – 2015-04-08 (×3): via INTRAVENOUS

## 2015-04-07 MED ORDER — FAMOTIDINE 20 MG PO TABS
20.0000 mg | ORAL_TABLET | Freq: Every day | ORAL | Status: DC
  Administered 2015-04-07 – 2015-04-09 (×3): 20 mg via ORAL
  Filled 2015-04-07 (×3): qty 1

## 2015-04-07 MED ORDER — ACETAMINOPHEN 650 MG RE SUPP: 650.0000 mg | Freq: Four times a day (QID) | RECTAL | Status: DC | PRN

## 2015-04-07 MED ORDER — CARVEDILOL 12.5 MG PO TABS
12.5000 mg | ORAL_TABLET | Freq: Two times a day (BID) | ORAL | Status: DC
  Administered 2015-04-07 – 2015-04-09 (×5): 12.5 mg via ORAL
  Filled 2015-04-07 (×7): qty 1

## 2015-04-07 MED ORDER — GABAPENTIN 300 MG PO CAPS
300.0000 mg | ORAL_CAPSULE | Freq: Two times a day (BID) | ORAL | Status: DC
  Administered 2015-04-07 – 2015-04-09 (×6): 300 mg via ORAL
  Filled 2015-04-07 (×7): qty 1

## 2015-04-07 MED ORDER — ROSUVASTATIN CALCIUM 20 MG PO TABS
20.0000 mg | ORAL_TABLET | Freq: Every day | ORAL | Status: DC
  Administered 2015-04-07 – 2015-04-08 (×3): 20 mg via ORAL
  Filled 2015-04-07 (×4): qty 1

## 2015-04-07 MED ORDER — CLOPIDOGREL BISULFATE 75 MG PO TABS
75.0000 mg | ORAL_TABLET | Freq: Every day | ORAL | Status: DC
  Administered 2015-04-07 – 2015-04-09 (×3): 75 mg via ORAL
  Filled 2015-04-07 (×4): qty 1

## 2015-04-07 MED ORDER — ADULT MULTIVITAMIN W/MINERALS CH
1.0000 | ORAL_TABLET | Freq: Every day | ORAL | Status: DC
  Administered 2015-04-07: 1 via ORAL
  Filled 2015-04-07: qty 1

## 2015-04-07 MED ORDER — VITAMIN B-1 100 MG PO TABS
100.0000 mg | ORAL_TABLET | Freq: Every day | ORAL | Status: DC
  Administered 2015-04-07: 100 mg via ORAL
  Filled 2015-04-07: qty 1

## 2015-04-07 MED ORDER — ASPIRIN 81 MG PO CHEW
81.0000 mg | CHEWABLE_TABLET | Freq: Every day | ORAL | Status: DC
  Administered 2015-04-07 – 2015-04-09 (×3): 81 mg via ORAL
  Filled 2015-04-07 (×3): qty 1

## 2015-04-07 NOTE — Progress Notes (Signed)
Orthostatic vital signs attempted- patient unable to stand for 3 minutes with 2 person assist. Orthostatics completed as possible.

## 2015-04-07 NOTE — Progress Notes (Signed)
Patient ID: [name], female   DOB: [birth year], 81 y.o.   MRN: [id number] [facility] PROGRESS NOTE  SHANITHA TWINING YJE:[id number] DOB: [birth year] DOA: 04/06/2015 PCP: [name], MD  Brief narrative:    Addendum to admission note done 04/07/2015. 81 y.o. Female with past medical history of diabetes, hypertension, dyslipidemia who presented from home with fevers and altered mental status over past 1-2 days prior to this admission. She was recently treated for UTI. Patient was started on empiric Rocephin for possible urinary tract infection considering history of recent UTI however her urinalysis on this admission did not reveal leukocytes or bacteria. She did have a fever in ED of 102.5 F.  Barrier to discharge: Awaiting urine culture results. Patient is on IV Rocephin. Anticipate discharge by 04/09/2015 dependent on physical therapy evaluation and recommendation.   Assessment/Plan:    Principal problem: Acute encephalopathy / possible urinary tract infection / fever - Patient presented with fever and altered mental status. Urinalysis on the admission did not reveal leukocytes or bacteria but because of the fever and altered mental status patient was started empirically on Rocephin for possible UTI. - Follow up urine culture results - Appreciate physical therapy evaluation.  Active Problems: Diabetes type 2, controlled with peripheral neuropathy - Continue metformin 500 mg twice daily - Continue gabapentin 300 mg twice daily  Essential hypertension - Continue Norvasc 5 mg daily, carvedilol 12.5 mg twice daily, losartan 50 mg daily  Coronary artery disease - Stable. Continue aspirin and Plavix.  History of breast cancer - Continue tamoxifen daily.  Dyslipidemia - Continue Crestor at bedtime.   DVT Prophylaxis  - Heparin subQ ordered    Code Status: Full.  Family Communication:  Family not at the bedside this am  Disposition Plan: needs PT eval for safe D/C  plan   IV access:  Peripheral IV  Procedures and diagnostic studies:    Dg Chest 2 View 04/06/2015  Mild bibasilar atelectasis and small left pleural effusion.     Dg Chest Port 1 View 04/07/2015   Stable bibasilar linear density likely atelectasis.     Medical Consultants:  None   Other Consultants:  Physical therapy   IAnti-Infectives:   Rocephin 04/07/2015 -->    Leisa Lenz, MD  [facility] Pager [phone]  If 7PM-7AM, please contact night-coverage [email or web address] Password [email or web address] 04/07/2015, 10:59 AM   LOS: 0 days    HPI/Subjective: No acute overnight events.   Objective: Filed Vitals:   04/06/15 2026 04/06/15 2327 04/07/15 0100 04/07/15 0500  BP: 168/61 146/59 182/58   Pulse: 82 81 90   Temp: 102.5 F (39.2 C) 99.9 F (37.7 C) 99 F (37.2 C) 98.3 F (36.8 C)  TempSrc: Oral Oral Oral   Resp: 18 14 22    SpO2: 97% 93% 99% 97%   No intake or output data in the 24 hours ending 04/07/15 1059  Exam:   General:  Pt is not in acute distress  Cardiovascular: Rate controlled, S1/S2 (+)  Respiratory: Clear to auscultation bilaterally, no wheezing, no crackles, no rhonchi  Abdomen: Soft, non tender, non distended, bowel sounds present  Extremities: No edema, pulses DP and PT palpable bilaterally  Neuro: Grossly nonfocal  Data Reviewed: Basic Metabolic Panel:  Recent Labs Lab 04/01/15 0730 04/06/15 2102 04/07/15 0147  NA 140 134* 134*  K 3.8 4.2 3.9  CL 109 101 104  CO2 22 24 21*  GLUCOSE 101* 113* 130*  BUN 13 16 16   CREATININE 0.95 0.93  0.90  CALCIUM 10.7* 10.1 9.8   Liver Function Tests:  Recent Labs Lab 04/06/15 2102 04/07/15 0147  AST 13* 14*  ALT 8* 9*  ALKPHOS 34* 32*  BILITOT 0.3 0.3  PROT 7.4 7.4  ALBUMIN 3.5 3.5   No results for input(s): LIPASE, AMYLASE in the last 168 hours. No results for input(s): AMMONIA in the last 168 hours. CBC:  Recent Labs Lab 04/01/15 0730 04/06/15 2102 04/07/15 0147  WBC 4.1 5.8 5.7   NEUTROABS 2.1 4.0 3.8  HGB 9.4* 9.1* 9.3*  HCT 29.3* 29.4* 28.9*  MCV 70.6* 71.5* 71.2*  PLT 171 195 152   Cardiac Enzymes: No results for input(s): CKTOTAL, CKMB, CKMBINDEX, TROPONINI in the last 168 hours. BNP: Invalid input(s): POCBNP CBG: No results for input(s): GLUCAP in the last 168 hours.  No results found for this or any previous visit (from the past 240 hour(s)).   Scheduled Meds: . amLODipine  5 mg Oral Daily  . aspirin  81 mg Oral Daily  . carvedilol  12.5 mg Oral BID WC  .  (ROCEPHIN)  IV  1 g Intravenous Q24H  . clopidogrel  75 mg Oral Daily  . famotidine  20 mg Oral Daily  . folic acid  1 mg Oral Daily  . gabapentin  300 mg Oral BID  . heparin  5,000 Units Subcutaneous 3 times per day  . losartan  50 mg Oral Daily  . metFORMIN  500 mg Oral BID WC  . multivitamin  1 tablet Oral Daily  . rosuvastatin  20 mg Oral QHS  . tamoxifen  20 mg Oral Daily  . thiamine  100 mg Oral Daily

## 2015-04-07 NOTE — H&P (Addendum)
[facility] History and Physical  MAVI UN NGE:[id number] DOB: 10-13-1924 DOA: 79/24/2016  Referring physician: Etta Quill, PA PCP: [name], MD   Chief Complaint: fever  HPI: [name] is a 81 y.o. female with diabetes mellitus HTN hyperlipidemia CHF recent UTI presents with a fever and altered mental status. Patient states that her daughter told her she had a fever. No body is in the room with her at the time of her evaluation. Patient states that her daughter just brought her here. She states she has no headache. She has no dysuria she has no cough  Noted. She has not been bringing up any sputum. She has no abdominal pain noted. She has no nausea or vomiting noted. She states there is no chest pain noted. She was in the ED on 5/19 and was found to have a UTI. She was sent home with Keflex for the UTI. She states that she thinks her daughter was giving her the medications. She does not appear toxic and her initial blood work shows a normal lactate.   Review of Systems:  Complete ROS is performed limited by the patients ability to provide a history but is unremarkable other than HPI  Past Medical History  Diagnosis Date  . Breast cancer 10/2010    Invasive Ductal Carcinoma, s/p bilateral mastectomy, now on Tamoxifen. Followed by Dr Jamse Arn.   . Diabetes mellitus   . Hypertension   . Hyperlipidemia   . Anemia     Iron def anemia  . CAD (coronary artery disease)   . CHF (congestive heart failure)     Systolic   . History of colon cancer   . Neuropathy   . Allergy   . GERD (gastroesophageal reflux disease)   . Hiatal hernia   . Diverticulosis   . Asthma   . Arthritis   . H/O: GI bleed   . Breast cancer 2011    s/p Bilateral masectomy  . Shortness of breath   . Pneumonia 03/2012  . Carotid artery aneurysm 08/2010    right ICA, 5 x 63mm  . DDD (degenerative disc disease), cervical   . Colon cancer   . Colon cancer 12/11/2013  . Myocardial infarction   .  Hypercalcemia 06/09/2014   Past Surgical History  Procedure Laterality Date  . Breast masectomy  10/2010    Bilateral masectomy by Dr Lucia Gaskins s/p invasive ductal carcinoma.  . Cardiac  catherization  2007    Severe 3-vessel disease.  EF 20-25%.  . Esophagogastroduodenoscopy  2001    Esophageal Tear  . Total knee arthroplasty  2001  . Esophagogastroduodenoscopy  10/27/2012    Procedure: ESOPHAGOGASTRODUODENOSCOPY (EGD);  Surgeon: Irene Shipper, MD;  Location: [facility] ENDOSCOPY;  Service: Endoscopy;  Laterality: N/A;  pat  . Breast surgery    . Joint replacement Right 2001    Knee   Social History:  reports that she has never smoked. Her smokeless tobacco use includes Snuff. She reports that she does not drink alcohol or use illicit drugs.  Allergies  Allergen Reactions  . Peanuts [Nuts] Swelling  . Lisinopril Cough    Family History  Problem Relation Age of Onset  . Sickle cell anemia Other   . Heart disease Mother   . Heart disease Father   . Cancer Daughter 40    breast ca  . Hypertension Daughter   . Cancer Daughter 70    breast ca  . Hypertension Daughter   . Heart disease  Son     Poor circulation-Left Leg  . Diabetes Daughter   . Hypertension Daughter   . Hyperlipidemia Daughter     Poor circulation- Toe amputation     Prior to Admission medications   Medication Sig Start Date End Date Taking? Authorizing Provider  acetaminophen (TYLENOL) 500 MG tablet Take 500 mg by mouth every 6 (six) hours as needed for moderate pain (pain).   Yes Historical Provider, MD  amLODipine (NORVASC) 5 MG tablet Take 1 tablet (5 mg total) by mouth daily. 02/26/15  Yes [name], MD  aspirin (ASPIRIN CHILDRENS) 81 MG chewable tablet Chew 81 mg by mouth daily.     Yes Historical Provider, MD  carvedilol (COREG) 12.5 MG tablet Take 1 tablet (12.5 mg total) by mouth 2 (two) times daily with a meal. 03/04/15  Yes Sharon Mt Street, MD  cephALEXin (KEFLEX) 500 MG capsule Take 1 capsule (500  mg total) by mouth 2 (two) times daily. 04/01/15  Yes Davonna Belling, MD  clopidogrel (PLAVIX) 75 MG tablet TAKE 1 TABLET (75 MG TOTAL) BY MOUTH DAILY. 02/22/15  Yes [name], MD  diclofenac sodium (VOLTAREN) 1 % GEL APPLY 2 GRAMS TOPICALLY 4 (FOUR) TIMES DAILY. Patient taking differently: APPLY 2 GRAMS TOPICALLY BID 04/06/15  Yes [name], MD  gabapentin (NEURONTIN) 300 MG capsule TAKE ONE CAPSULE BY MOUTH TWICE A DAY 03/26/15  Yes Virginia Crews, MD  glucose blood test strip Use as instructed Dx. Code 250.02 09/28/14  Yes [name], MD  losartan (COZAAR) 50 MG tablet TAKE 1 TABLET (50 MG TOTAL) BY MOUTH AT BEDTIME. 03/18/15  Yes [name], MD  metFORMIN (GLUCOPHAGE) 500 MG tablet TAKE 2 TABLETS (1,000 MG TOTAL) BY MOUTH 2 (TWO) TIMES DAILY WITH A MEAL. Patient taking differently: TAKE 1 TABLET 500 MG  BY MOUTH 2 (TWO) TIMES DAILY WITH A MEAL. 10/30/14  Yes [name], MD  ranitidine (ZANTAC) 150 MG capsule TAKE ONE CAPSULE BY MOUTH TWICE A DAY AS NEEDED FOR HEARTBURN 03/16/15  Yes [name], MD  rosuvastatin (CRESTOR) 20 MG tablet Take 1 tablet (20 mg total) by mouth at bedtime. 02/08/15  Yes [name], MD  tamoxifen (NOLVADEX) 20 MG tablet Take 20 mg by mouth daily.  03/20/15  Yes Historical Provider, MD  traMADol (ULTRAM) 50 MG tablet Take 1 tablet (50 mg total) by mouth every 12 (twelve) hours as needed for severe pain. 03/16/15  Yes [name], MD  albuterol (PROVENTIL HFA;VENTOLIN HFA) 108 (90 BASE) MCG/ACT inhaler Inhale 2 puffs into the lungs every 6 (six) hours as needed. For shortness of breath 01/18/15   [name], MD  cyclobenzaprine (FLEXERIL) 5 MG tablet Take 0.5-1 tablets (2.5-5 mg total) by mouth 3 (three) times daily as needed for muscle spasms. Patient not taking: Reported on 04/06/2015 11/26/14   Olin Hauser, DO  ferrous sulfate 325 (65 FE) MG tablet Take 1 tablet (325 mg total) by mouth daily with  breakfast. Patient not taking: Reported on 04/01/2015 01/27/15   Katheren Shams, DO  furosemide (LASIX) 20 MG tablet Take 0.5 tablets (10 mg total) by mouth daily as needed for fluid or edema. Patient not taking: Reported on 04/01/2015 01/27/15   Katheren Shams, DO  loratadine (CLARITIN) 10 MG tablet Take 0.5 tablets (5 mg total) by mouth daily. Patient not taking: Reported on 04/01/2015 02/09/15   [name], MD  magnesium hydroxide (MILK OF MAGNESIA)  400 MG/5ML suspension Take 30 mLs by mouth daily as needed for mild constipation.    Historical Provider, MD  ondansetron (ZOFRAN ODT) 4 MG disintegrating tablet Take 1 tablet (4 mg total) by mouth every 8 (eight) hours as needed for nausea or vomiting. Patient not taking: Reported on 04/06/2015 02/09/15   [name], MD  ondansetron (ZOFRAN) 4 MG tablet Take 1 tablet (4 mg total) by mouth every 8 (eight) hours as needed for nausea or vomiting. Patient not taking: Reported on 04/01/2015 08/19/14   Olam Idler, MD   Physical Exam: Filed Vitals:   04/06/15 2026 04/06/15 2327  BP: 168/61 146/59  Pulse: 82 81  Temp: 102.5 F (39.2 C) 99.9 F (37.7 C)  TempSrc: Oral Oral  Resp: 18 14  SpO2: 97% 93%    Wt Readings from Last 3 Encounters:  04/01/15 51.71 kg (114 lb)  03/16/15 48.988 kg (108 lb)  03/04/15 52.889 kg (116 lb 9.6 oz)    General:  Appears calm and comfortable Eyes: PERRL, normal lids, irises & conjunctiva ENT: grossly normal hearing, lips & tongue Neck: no LAD, masses or thyromegaly Cardiovascular: RRR, no m/r/g. No LE edema. Respiratory: CTA bilaterally, no w/r/r Abdomen: soft, ntnd Skin: no rash or induration seen on limited exam Musculoskeletal: grossly normal tone BUE/BLE Psychiatric: grossly normal mood Neurologic: grossly non-focal.          Labs on Admission:  Basic Metabolic Panel:  Recent Labs Lab 04/01/15 0730 04/06/15 2102  NA 140 134*  K 3.8 4.2  CL 109 101  CO2 22 24  GLUCOSE 101* 113*   BUN 13 16  CREATININE 0.95 0.93  CALCIUM 10.7* 10.1   Liver Function Tests:  Recent Labs Lab 04/06/15 2102  AST 13*  ALT 8*  ALKPHOS 34*  BILITOT 0.3  PROT 7.4  ALBUMIN 3.5   No results for input(s): LIPASE, AMYLASE in the last 168 hours. No results for input(s): AMMONIA in the last 168 hours. CBC:  Recent Labs Lab 04/01/15 0730 04/06/15 2102  WBC 4.1 5.8  NEUTROABS 2.1 4.0  HGB 9.4* 9.1*  HCT 29.3* 29.4*  MCV 70.6* 71.5*  PLT 171 195   Cardiac Enzymes: No results for input(s): CKTOTAL, CKMB, CKMBINDEX, TROPONINI in the last 168 hours.  BNP (last 3 results)  Recent Labs  01/25/15 2336  BNP 179.2*    ProBNP (last 3 results)  Recent Labs  04/29/14 1050  PROBNP 372.7    CBG: No results for input(s): GLUCAP in the last 168 hours.  Radiological Exams on Admission: Dg Chest 2 View  04/06/2015   CLINICAL DATA:  Fever  EXAM: CHEST  2 VIEW  COMPARISON:  04/01/2015  FINDINGS: Borderline cardiomegaly, accentuated by rotation. Stable aortic tortuosity.  Linear opacities in the bilateral lower lungs, subsegmental atelectasis. There is a small left pleural effusion noted laterally. No edema or pneumothorax.  IMPRESSION: Mild bibasilar atelectasis and small left pleural effusion.   Electronically Signed   By: Monte Fantasia M.D.   On: 04/06/2015 21:22        Assessment/Plan Active Problems:   Diabetes type 2, controlled   Chronic systolic heart failure   Fever   Hypertension   1. Fever -may be related to recent UTI event though urine now is clear she did receive antibiotics and so could have a negative urine -at this time would start her on empiric coverage for a UTI until culture results are available -also noted to have some basilar atelectasis  on CXR with very small effusion will need to follow up CXR  2. Diabetes Type II -appears to be controlled will continue present therapy -will check A1C -SSI as needed  3. Chronic Systolic Heart  failure -currently stable -last EF by echo 55-60%  4. Hypertension -will continue with antihypertensives -monitor pressures  5. Hyperlipidemia -check lipid panel  6. H/o Breast cancer -continue tamoxifen  Code Status: Full Code (must indicate code status--if unknown or must be presumed, indicate so) DVT Prophylaxis:Heparin Family Communication: None (indicate person spoken with, if applicable, with phone number if by telephone) Disposition Plan: home  Time spent: 53min  KHAN,SAADAT A [facility] Pager [phone]

## 2015-04-07 NOTE — Progress Notes (Signed)
ANTIBIOTIC CONSULT NOTE - INITIAL  Pharmacy Consult for Rocephin Indication: Urosepsis  Allergies  Allergen Reactions  . Peanuts [Nuts] Swelling  . Lisinopril Cough    Patient Measurements:   Wt=51 lg  Vital Signs: Temp: 99 F (37.2 C) (05/25 0100) Temp Source: Oral (05/25 0100) BP: 182/58 mmHg (05/25 0100) Pulse Rate: 90 (05/25 0100) Intake/Output from previous day:   Intake/Output from this shift:    Labs:  Recent Labs  04/06/15 2102  WBC 5.8  HGB 9.1*  PLT 195  CREATININE 0.93   Estimated Creatinine Clearance: 33.5 mL/min (by C-G formula based on Cr of 0.93). No results for input(s): VANCOTROUGH, VANCOPEAK, VANCORANDOM, GENTTROUGH, GENTPEAK, GENTRANDOM, TOBRATROUGH, TOBRAPEAK, TOBRARND, AMIKACINPEAK, AMIKACINTROU, AMIKACIN in the last 72 hours.   Microbiology: No results found for this or any previous visit (from the past 720 hour(s)).  Medical History: Past Medical History  Diagnosis Date  . Breast cancer 10/2010    Invasive Ductal Carcinoma, s/p bilateral mastectomy, now on Tamoxifen. Followed by Dr Jamse Arn.   . Diabetes mellitus   . Hypertension   . Hyperlipidemia   . Anemia     Iron def anemia  . CAD (coronary artery disease)   . CHF (congestive heart failure)     Systolic   . History of colon cancer   . Neuropathy   . Allergy   . GERD (gastroesophageal reflux disease)   . Hiatal hernia   . Diverticulosis   . Asthma   . Arthritis   . H/O: GI bleed   . Breast cancer 2011    s/p Bilateral masectomy  . Shortness of breath   . Pneumonia 03/2012  . Carotid artery aneurysm 08/2010    right ICA, 5 x 43mm  . DDD (degenerative disc disease), cervical   . Colon cancer   . Colon cancer 12/11/2013  . Myocardial infarction   . Hypercalcemia 06/09/2014    Medications:  Prescriptions prior to admission  Medication Sig Dispense Refill Last Dose  . acetaminophen (TYLENOL) 500 MG tablet Take 500 mg by mouth every 6 (six) hours as needed for moderate  pain (pain).   04/05/2015 at Unknown time  . amLODipine (NORVASC) 5 MG tablet Take 1 tablet (5 mg total) by mouth daily. 90 tablet 0 04/05/2015 at Unknown time  . aspirin (ASPIRIN CHILDRENS) 81 MG chewable tablet Chew 81 mg by mouth daily.     04/05/2015 at Unknown time  . carvedilol (COREG) 12.5 MG tablet Take 1 tablet (12.5 mg total) by mouth 2 (two) times daily with a meal. 60 tablet 3 04/06/2015 at 1030  . cephALEXin (KEFLEX) 500 MG capsule Take 1 capsule (500 mg total) by mouth 2 (two) times daily. 14 capsule 0 04/06/2015 at Unknown time  . clopidogrel (PLAVIX) 75 MG tablet TAKE 1 TABLET (75 MG TOTAL) BY MOUTH DAILY. 30 tablet 2 04/06/2015 at 1030  . diclofenac sodium (VOLTAREN) 1 % GEL APPLY 2 GRAMS TOPICALLY 4 (FOUR) TIMES DAILY. (Patient taking differently: APPLY 2 GRAMS TOPICALLY BID) 100 g 0 04/06/2015 at Unknown time  . gabapentin (NEURONTIN) 300 MG capsule TAKE ONE CAPSULE BY MOUTH TWICE A DAY 60 capsule 1 04/06/2015 at Unknown time  . glucose blood test strip Use as instructed Dx. Code 250.02 100 each 12 04/06/2015 at Unknown time  . losartan (COZAAR) 50 MG tablet TAKE 1 TABLET (50 MG TOTAL) BY MOUTH AT BEDTIME. 30 tablet 4 04/06/2015 at Unknown time  . metFORMIN (GLUCOPHAGE) 500 MG tablet TAKE 2 TABLETS (1,000 MG  TOTAL) BY MOUTH 2 (TWO) TIMES DAILY WITH A MEAL. (Patient taking differently: TAKE 1 TABLET 500 MG  BY MOUTH 2 (TWO) TIMES DAILY WITH A MEAL.) 120 tablet 1 04/06/2015 at Unknown time  . ranitidine (ZANTAC) 150 MG capsule TAKE ONE CAPSULE BY MOUTH TWICE A DAY AS NEEDED FOR HEARTBURN 60 capsule 3 04/05/2015 at Unknown time  . rosuvastatin (CRESTOR) 20 MG tablet Take 1 tablet (20 mg total) by mouth at bedtime. 30 tablet 2 04/05/2015 at Unknown time  . tamoxifen (NOLVADEX) 20 MG tablet Take 20 mg by mouth daily.   3 04/06/2015 at 1030  . traMADol (ULTRAM) 50 MG tablet Take 1 tablet (50 mg total) by mouth every 12 (twelve) hours as needed for severe pain. 30 tablet 0 04/06/2015 at Unknown time  .  albuterol (PROVENTIL HFA;VENTOLIN HFA) 108 (90 BASE) MCG/ACT inhaler Inhale 2 puffs into the lungs every 6 (six) hours as needed. For shortness of breath 1 Inhaler 6 unknown at unknown time  . cyclobenzaprine (FLEXERIL) 5 MG tablet Take 0.5-1 tablets (2.5-5 mg total) by mouth 3 (three) times daily as needed for muscle spasms. (Patient not taking: Reported on 04/06/2015) 30 tablet 1 Completed Course at Unknown time  . ferrous sulfate 325 (65 FE) MG tablet Take 1 tablet (325 mg total) by mouth daily with breakfast. (Patient not taking: Reported on 04/01/2015) 30 tablet 0 Not Taking at Unknown time  . furosemide (LASIX) 20 MG tablet Take 0.5 tablets (10 mg total) by mouth daily as needed for fluid or edema. (Patient not taking: Reported on 04/01/2015) 30 tablet 0 Not Taking at Unknown time  . loratadine (CLARITIN) 10 MG tablet Take 0.5 tablets (5 mg total) by mouth daily. (Patient not taking: Reported on 04/01/2015) 30 tablet 1 Not Taking at Unknown time  . magnesium hydroxide (MILK OF MAGNESIA) 400 MG/5ML suspension Take 30 mLs by mouth daily as needed for mild constipation.   unknown at unknown time  . ondansetron (ZOFRAN ODT) 4 MG disintegrating tablet Take 1 tablet (4 mg total) by mouth every 8 (eight) hours as needed for nausea or vomiting. (Patient not taking: Reported on 04/06/2015) 20 tablet 0 Completed Course at Unknown time  . ondansetron (ZOFRAN) 4 MG tablet Take 1 tablet (4 mg total) by mouth every 8 (eight) hours as needed for nausea or vomiting. (Patient not taking: Reported on 04/01/2015) 20 tablet 0 Not Taking at Unknown time   Scheduled:  . amLODipine  5 mg Oral Daily  . aspirin  81 mg Oral Daily  . carvedilol  12.5 mg Oral BID WC  . cefTRIAXone (ROCEPHIN)  IV  2 g Intravenous Once  . clopidogrel  75 mg Oral Daily  . diclofenac sodium  2 g Topical QID  . famotidine  20 mg Oral Daily  . folic acid  1 mg Oral Daily  . gabapentin  300 mg Oral BID  . heparin  5,000 Units Subcutaneous 3 times  per day  . losartan  50 mg Oral Daily  . metFORMIN  500 mg Oral BID WC  . multivitamin with minerals  1 tablet Oral Daily  . rosuvastatin  20 mg Oral QHS  . tamoxifen  20 mg Oral Daily  . thiamine  100 mg Oral Daily   Infusions:  . sodium chloride     Assessment: 67 yoF presents with fever and altered mental status.  Rocephin per Rx for Urosepsis.   Goal of Therapy:  Treat UTI  Plan:   Rocephin 2Gm  x1 then 1Gm IV q24h  F/u cultures as needed  Dorrene German 04/07/2015,2:06 AM

## 2015-04-08 DIAGNOSIS — E119 Type 2 diabetes mellitus without complications: Secondary | ICD-10-CM

## 2015-04-08 LAB — URINE CULTURE
CULTURE: NO GROWTH
Colony Count: NO GROWTH

## 2015-04-08 LAB — HEMOGLOBIN A1C
Hgb A1c MFr Bld: 7 % — ABNORMAL HIGH (ref 4.8–5.6)
MEAN PLASMA GLUCOSE: 154 mg/dL

## 2015-04-08 LAB — GLUCOSE, CAPILLARY
Glucose-Capillary: 129 mg/dL — ABNORMAL HIGH (ref 65–99)
Glucose-Capillary: 123 mg/dL — ABNORMAL HIGH (ref 65–99)

## 2015-04-08 LAB — CLOSTRIDIUM DIFFICILE BY PCR: Toxigenic C. Difficile by PCR: NEGATIVE

## 2015-04-08 MED ORDER — INSULIN ASPART 100 UNIT/ML ~~LOC~~ SOLN
0.0000 [IU] | Freq: Three times a day (TID) | SUBCUTANEOUS | Status: DC
  Administered 2015-04-08 – 2015-04-09 (×2): 1 [IU] via SUBCUTANEOUS

## 2015-04-08 MED ORDER — ONDANSETRON HCL 4 MG/2ML IJ SOLN: 4.0000 mg | Freq: Four times a day (QID) | INTRAMUSCULAR | Status: DC | PRN

## 2015-04-08 MED ORDER — ONDANSETRON HCL 4 MG/2ML IJ SOLN
4.0000 mg | Freq: Four times a day (QID) | INTRAMUSCULAR | Status: DC
  Administered 2015-04-08: 4 mg via INTRAVENOUS
  Filled 2015-04-08: qty 2

## 2015-04-08 NOTE — Clinical Social Work Note (Signed)
Clinical Social Work Assessment  Patient Details  Name: [name] MRN: [id number] Date of Birth: 24-Jul-1924  Date of referral:  04/08/15               Reason for consult:  Discharge Planning                Permission sought to share information with:  Family Supports Permission granted to share information::  Yes, Verbal Permission Granted  Name::      (daughter and son- Mae and Fritz Pickerel)  Agency::     Relationship::  adult children  Contact Information:   (Mae- [phone])  Housing/Transportation Living arrangements for the past 2 months:  [place] of Information:  Adult Children Patient Interpreter Needed:  None Criminal Activity/Legal Involvement Pertinent to Current Situation/Hospitalization:  No - Comment as needed Significant Relationships:  Adult Children Lives with:  Adult Children Do you feel safe going back to the place where you live?  Yes Need for family participation in patient care:  Yes (Comment)  Care giving concerns:  Patient may need SNF at dc- awaiting PT eval and recommendations.   Social Worker assessment / plan:  CSW met with patient and her son and daughter at bedside. Explained CSW role and discussed possible SNF placement at dc. Per family, patient has recently completed [place] therapues via [facility]. Family is with patient 24/7 and are comfortable taking her home if she is able- Awaiting PT eval and recommendations.   Employment status:  Retired Forensic scientist:  Medicare PT Recommendations:  Not assessed at this time [place] / Referral to community resources:  Other (Comment Required)  Patient/Family's Response to care:  Family is very supportive and involved. They are hopeful she will be able to return home.  Patient/Family's Understanding of and Emotional Response to Diagnosis, Current Treatment, and Prognosis:     Emotional Assessment Appearance:  Appears younger than stated age Attitude/Demeanor/Rapport:     Affect (typically observed):  Hopeful, Appropriate Orientation:  Oriented to Self, Oriented to Place, Oriented to  Time, Oriented to Situation Alcohol / Substance use:  Not Applicable Psych involvement (Current and /or in the community):  No (Comment)  Discharge Needs  Concerns to be addressed:    Readmission within the last 30 days:  No Current discharge risk:  Physical Impairment Barriers to Discharge:  No Barriers Identified   Ludwig Clarks, LCSW 04/08/2015, 3:45 PM

## 2015-04-08 NOTE — Progress Notes (Signed)
PT Cancellation Note  Patient Details Name: [name] MRN: [id number] DOB: 02-04-1924   Cancelled Treatment:    Reason Eval/Treat Not Completed: Medical issues which prohibited therapy (pt vomiting)   Kenyon Ana 04/08/2015, 12:28 PM

## 2015-04-08 NOTE — Progress Notes (Addendum)
Patient ID: [name], female   DOB: [birth year], 81 y.o.   MRN: [id number] [facility] PROGRESS NOTE  [name] HKV:[id number] DOB: 1924-11-10 DOA: 04/06/2015 PCP: [name], MD  Brief narrative:    81 y.o. Female with past medical history of diabetes, hypertension, dyslipidemia who presented from home with fevers and altered mental status over past 1-2 days prior to this admission. She was recently treated for UTI.  On admission, patient presented with fever of 102.76F. Even though her urinalysis did not reveal leukocytes or bacteria she was started on empiric Rocephin for possible UTI considering recent treatment for UTI.  Barrier to discharge: Urine culture and blood cultures so far show no growth. Patient will require physical therapy evaluation. Anticipate discharge tomorrow 04/09/2015.   Assessment/Plan:    Principal problem: Acute encephalopathy / possible urinary tract infection / fever - Patient presented with fever and altered mental status. Likely secondary to possible urinary tract infection. - Urine culture on this admission does not show any growth. We will continue Rocephin through today and she will not require antibodies on discharge. - Physical therapy evaluation is pending.   Active Problems: Diabetes type 2, controlled with peripheral neuropathy - Continue metformin 500 mg twice daily - Continue gabapentin 300 mg twice daily for neuropathy   Essential hypertension - Continue Norvasc 5 mg daily, carvedilol 12.5 mg twice daily, losartan 50 mg daily - BP 158/60.  Coronary artery disease - Stable. Continue aspirin and Plavix.  History of breast cancer - Continue tamoxifen daily.  Dyslipidemia - Continue Crestor at bedtime.   DVT Prophylaxis  - Heparin subQ ordered while pt is in hospital.    Code Status: Full.  Family Communication:  Family not at the bedside this am  Disposition Plan: Depending on physical therapy evaluation plan for  discharge either to home or skilled nursing facility 04/09/2015  IV access:  Peripheral IV  Procedures and diagnostic studies:    Dg Chest 2 View 04/06/2015  Mild bibasilar atelectasis and small left pleural effusion.     Dg Chest Port 1 View 04/07/2015   Stable bibasilar linear density likely atelectasis.     Medical Consultants:  None   Other Consultants:  Physical therapy - evaluation pending  IAnti-Infectives:   Rocephin 04/07/2015 -->    Leisa Lenz, MD  [facility] Pager [phone]  If 7PM-7AM, please contact night-coverage [email or web address] Password [facility] 04/08/2015, 9:41 AM   LOS: 1 day    HPI/Subjective: No acute overnight events. Patient reports no nausea or vomiting.  Objective: Filed Vitals:   04/07/15 0500 04/07/15 1342 04/07/15 2049 04/08/15 0521  BP:  121/57 169/45 158/60  Pulse:  67 81 72  Temp: 98.3 F (36.8 C) 98.4 F (36.9 C) 99.5 F (37.5 C) 99.3 F (37.4 C)  TempSrc:  Oral Oral Oral  Resp:  18 20 16   SpO2: 97% 100% 99% 100%    Intake/Output Summary (Last 24 hours) at 04/08/15 0941 Last data filed at 04/08/15 5188  Gross per 24 hour  Intake 1492.6 ml  Output      0 ml  Net 1492.6 ml    Exam:   General:  Pt is awake, alert, no distress  Cardiovascular: RRR, (+) S1,S2  Respiratory: Bilateral air entry, no wheezing  Abdomen: Nontender, nondistended abdomen, appreciate bowel sounds  Extremities: Pulses palpable, no lower extremity edema  Neuro: No focal neurological deficits  Data Reviewed: Basic Metabolic Panel:  Recent Labs Lab 04/06/15 2102 04/07/15 0147  NA  134* 134*  K 4.2 3.9  CL 101 104  CO2 24 21*  GLUCOSE 113* 130*  BUN 16 16  CREATININE 0.93 0.90  CALCIUM 10.1 9.8   Liver Function Tests:  Recent Labs Lab 04/06/15 2102 04/07/15 0147  AST 13* 14*  ALT 8* 9*  ALKPHOS 34* 32*  BILITOT 0.3 0.3  PROT 7.4 7.4  ALBUMIN 3.5 3.5   No results for input(s): LIPASE, AMYLASE in the last 168 hours. No  results for input(s): AMMONIA in the last 168 hours. CBC:  Recent Labs Lab 04/06/15 2102 04/07/15 0147  WBC 5.8 5.7  NEUTROABS 4.0 3.8  HGB 9.1* 9.3*  HCT 29.4* 28.9*  MCV 71.5* 71.2*  PLT 195 152   Cardiac Enzymes: No results for input(s): CKTOTAL, CKMB, CKMBINDEX, TROPONINI in the last 168 hours. BNP: Invalid input(s): POCBNP CBG: No results for input(s): GLUCAP in the last 168 hours.  Culture, blood (routine x 2)     Status: None (Preliminary result)   Collection Time: 04/06/15  8:55 PM  Result Value Ref Range Status   Specimen Description BLOOD RAC  Final   Special Requests BOTTLES DRAWN AEROBIC AND ANAEROBIC 5CC  Final   Culture   Final           BLOOD CULTURE RECEIVED NO GROWTH TO DATE     Report Status PENDING  Incomplete  Urine culture     Status: None   Collection Time: 04/06/15 10:11 PM  Result Value Ref Range Status   Specimen Description URINE, CATHETERIZED  Final   Special Requests NONE  Final   Culture NO GROWTH Performed at [facility]   Final   Report Status 04/08/2015 FINAL  Final  Culture, blood (x 2)     Status: None (Preliminary result)   Collection Time: 04/07/15  1:47 AM  Result Value Ref Range Status   Specimen Description BLOOD LAC  Final   Special Requests BOTTLES DRAWN AEROBIC ONLY 4ML  Final   Culture   Final           BLOOD CULTURE RECEIVED NO GROWTH TO DATE     Report Status PENDING  Incomplete     Scheduled Meds: . amLODipine  5 mg Oral Daily  . aspirin  81 mg Oral Daily  . carvedilol  12.5 mg Oral BID WC  .  (ROCEPHIN)  IV  1 g Intravenous Q24H  . clopidogrel  75 mg Oral Daily  . famotidine  20 mg Oral Daily  . folic acid  1 mg Oral Daily  . gabapentin  300 mg Oral BID  . heparin  5,000 Units Subcutaneous 3 times per day  . losartan  50 mg Oral Daily  . metFORMIN  500 mg Oral BID WC  . multivitamin  1 tablet Oral Daily  . rosuvastatin  20 mg Oral QHS  . tamoxifen  20 mg Oral Daily  . thiamine  100 mg Oral Daily

## 2015-04-09 LAB — GLUCOSE, CAPILLARY
GLUCOSE-CAPILLARY: 137 mg/dL — AB (ref 65–99)
GLUCOSE-CAPILLARY: 111 mg/dL — AB (ref 65–99)

## 2015-04-09 MED ORDER — LEVOFLOXACIN 500 MG PO TABS: 500.0000 mg | ORAL_TABLET | Freq: Every day | ORAL | Status: DC

## 2015-04-09 NOTE — Progress Notes (Signed)
Physical Therapy Treatment Patient Details Name: SNEHA WILLIG MRN: [id number] DOB: 1924-04-16 Today's Date: 04/09/2015    History of Present Illness 81 y.o. Female with past medical history of diabetes, hypertension, dyslipidemia who presented from home with fevers and altered mental status over past 1-2 days prior to this admission on 04/06/15 She was recently treated for UTI.     PT Comments    Patient much improved and able to  [place] weight. Able to DC to home  Follow Up Recommendations  Home health PT;Supervision/Assistance - 24 hour     Equipment Recommendations   (daughter has a WC)    Recommendations for Other Services       Precautions / Restrictions Precautions Precautions: Fall Precaution Comments: bil foot pain    Mobility  Bed Mobility         Supine to sit: Min assist        Transfers Overall transfer level: Needs assistance Equipment used: Rolling walker (2 wheeled) Transfers: Sit to/from [facility] Sit to Stand: Mod assist Stand pivot transfers: Mod assist       General transfer comment: patient able to bear weight and pivot bed to [facility] to bed . much improved, still requires asist. family present to observe  Ambulation/Gait                 Stairs            Wheelchair Mobility    Modified Rankin (Stroke Patients Only)       Balance                                    Cognition Arousal/Alertness: Awake/alert Behavior During Therapy: WFL for tasks assessed/performed Overall Cognitive Status: Within Functional Limits for tasks assessed                      Exercises      General Comments        Pertinent Vitals/Pain      Home Living                      Prior Function            PT Goals (current goals can now be found in the care plan section) Progress towards PT goals: Progressing toward goals    Frequency       PT Plan Current plan remains appropriate     Co-evaluation             End of Session   Activity Tolerance: Patient tolerated treatment well Patient left: in bed;with call bell/phone within reach;with family/visitor present     Time: 1355-1414 PT Time Calculation (min) (ACUTE ONLY): 19 min  Charges:  $Therapeutic Activity: 8-22 mins                    G Codes:      [name] 04/09/2015, 3:16 PM

## 2015-04-09 NOTE — Discharge Summary (Signed)
Physician Discharge Summary  REN GRASSE IZT:[id number] DOB: [birth year] DOA: 04/06/2015  PCP: [name], MD  Admit date: 04/06/2015 Discharge date: 04/09/2015  Recommendations for Outpatient Follow-up:  Take Levaquin for next 3 days on discharge for treatment of possible UTI.  Discharge Diagnoses:  Active Problems:   Diabetes type 2, controlled   Chronic systolic heart failure   Fever   Hypertension   Acute encephalopathy   UTI (lower urinary tract infection)   Dyslipidemia   Essential hypertension    Discharge Condition: stable   Diet recommendation: as tolerated   History of present illness:  81 y.o. [name] with past medical history of diabetes, hypertension, dyslipidemia who presented from home with fevers and altered mental status over past 1-2 days prior to this admission. She was recently treated for UTI.  On admission, patient presented with fever of 102.66F. Even though her urinalysis did not reveal leukocytes or bacteria she was started on empiric Rocephin for possible UTI considering recent treatment for UTI.   Hospital Course:    Assessment/Plan:    Principal problem: Acute encephalopathy / possible urinary tract infection / fever - Patient presented with fever and altered mental status. Likely secondary to possible urinary tract infection versus possible underlying dementia. - Urine culture on this admission does not show any growth.  - She was started on Rocephin since the admission. Because she spiked a low-grade fever overnight we will discharge the patient with Levaquin for 3 days on discharge.   Active Problems: Diabetes type 2, controlled with peripheral neuropathy - Continue metformin 500 mg twice daily - Continue gabapentin 300 mg twice daily for neuropathy   Essential hypertension - Continue Norvasc 5 mg daily, carvedilol 12.5 mg twice daily, losartan 50 mg daily on discharge   Coronary artery disease - Continue aspirin and Plavix  on discharge  History of breast cancer - Continue tamoxifen daily.  Dyslipidemia - Continue Crestor at bedtime.   DVT Prophylaxis  - Heparin subQ ordered while pt is in hospital.    Code Status: Full.  Family Communication: Family not at the bedside this am    IV access:  Peripheral IV  Procedures and diagnostic studies:   Dg Chest 2 View 04/06/2015 Mild bibasilar atelectasis and small left pleural effusion.   Dg Chest Port 1 View 04/07/2015 Stable bibasilar linear density likely atelectasis.   Medical Consultants:  None   Other Consultants:  Physical therapy - evaluation pending  IAnti-Infectives:   Rocephin 04/07/2015 --> 04/09/2015   Signed:  Leisa Lenz, MD  [facility] 04/09/2015, 8:58 AM  Pager #: [phone]  Time spent in minutes: more than 30 minutes  Discharge Exam: Filed Vitals:   04/09/15 0539  BP: 159/56  Pulse: 79  Temp: 100.9 F (38.3 C)  Resp: 18   Filed Vitals:   04/08/15 0521 04/08/15 1449 04/08/15 2140 04/09/15 0539  BP: 158/60 134/52 193/58 159/56  Pulse: 72 73 77 79  Temp: 99.3 F (37.4 C) 98.2 F (36.8 C) 98.9 F (37.2 C) 100.9 F (38.3 C)  TempSrc: Oral Oral Oral Oral  Resp: 16 18 18 18   SpO2: 100% 100% 99% 98%    General: Pt is alert, follows commands appropriately, not in acute distress Cardiovascular: Regular rate and rhythm, S1/S2 + Respiratory: Clear to auscultation bilaterally, no wheezing, no crackles, no rhonchi Abdominal: Soft, non tender, non distended, bowel sounds +, no guarding Extremities: no edema, no cyanosis, pulses palpable bilaterally DP and PT Neuro: Nonfocal  Discharge Instructions  Discharge Instructions    Call MD for:  difficulty breathing, headache or visual disturbances    Complete by:  As directed      Call MD for:  persistant nausea and vomiting    Complete by:  As directed      Call MD for:  severe uncontrolled pain    Complete by:  As directed       Diet - low sodium heart healthy    Complete by:  As directed      Discharge instructions    Complete by:  As directed   Take Levaquin for next 3 days on discharge for treatment of possible UTI.     Increase activity slowly    Complete by:  As directed             Medication List    STOP taking these medications        cephALEXin 500 MG capsule  Commonly known as:  KEFLEX     cyclobenzaprine 5 MG tablet  Commonly known as:  FLEXERIL     ferrous sulfate 325 ([name] FE) MG tablet     furosemide 20 MG tablet  Commonly known as:  LASIX     loratadine 10 MG tablet  Commonly known as:  CLARITIN     ondansetron 4 MG disintegrating tablet  Commonly known as:  ZOFRAN ODT     ondansetron 4 MG tablet  Commonly known as:  ZOFRAN      TAKE these medications        acetaminophen 500 MG tablet  Commonly known as:  TYLENOL  Take 500 mg by mouth every 6 (six) hours as needed for moderate pain (pain).     albuterol 108 (90 BASE) MCG/ACT inhaler  Commonly known as:  PROVENTIL HFA;VENTOLIN HFA  Inhale 2 puffs into the lungs every 6 (six) hours as needed. For shortness of breath     amLODipine 5 MG tablet  Commonly known as:  NORVASC  Take 1 tablet (5 mg total) by mouth daily.     ASPIRIN CHILDRENS 81 MG chewable tablet  Generic drug:  aspirin  Chew 81 mg by mouth daily.     carvedilol 12.5 MG tablet  Commonly known as:  COREG  Take 1 tablet (12.5 mg total) by mouth 2 (two) times daily with a meal.     clopidogrel 75 MG tablet  Commonly known as:  PLAVIX  TAKE 1 TABLET (75 MG TOTAL) BY MOUTH DAILY.     diclofenac sodium 1 % Gel  Commonly known as:  VOLTAREN  APPLY 2 GRAMS TOPICALLY 4 (FOUR) TIMES DAILY.     gabapentin 300 MG capsule  Commonly known as:  NEURONTIN  TAKE ONE CAPSULE BY MOUTH TWICE A DAY     glucose blood test strip  - Use as instructed  - Dx. Code 250.02     levofloxacin 500 MG tablet  Commonly known as:  LEVAQUIN  Take 1 tablet (500 mg total) by  mouth daily.     losartan 50 MG tablet  Commonly known as:  COZAAR  TAKE 1 TABLET (50 MG TOTAL) BY MOUTH AT BEDTIME.     metFORMIN 500 MG tablet  Commonly known as:  GLUCOPHAGE  TAKE 2 TABLETS (1,000 MG TOTAL) BY MOUTH 2 (TWO) TIMES DAILY WITH A MEAL.     MILK OF MAGNESIA 400 MG/5ML suspension  Generic drug:  magnesium hydroxide  Take 30 mLs by mouth daily as needed for mild constipation.  ranitidine 150 MG capsule  Commonly known as:  ZANTAC  TAKE ONE CAPSULE BY MOUTH TWICE A DAY AS NEEDED FOR HEARTBURN     rosuvastatin 20 MG tablet  Commonly known as:  CRESTOR  Take 1 tablet (20 mg total) by mouth at bedtime.     tamoxifen 20 MG tablet  Commonly known as:  NOLVADEX  Take 20 mg by mouth daily.     traMADol 50 MG tablet  Commonly known as:  ULTRAM  Take 1 tablet (50 mg total) by mouth every 12 (twelve) hours as needed for severe pain.           Follow-up Information    Follow up with [name], MD. Schedule an appointment as soon as possible for a visit in 1 week.   Specialty:  Family Medicine   Why:  Follow up appt after recent hospitalization   Contact information:   [address] [place] [address] [phone]        The results of significant diagnostics from this hospitalization (including imaging, microbiology, ancillary and laboratory) are listed below for reference.    Significant Diagnostic Studies: Dg Chest 2 View  04/06/2015   CLINICAL DATA:  Fever  EXAM: CHEST  2 VIEW  COMPARISON:  04/01/2015  FINDINGS: Borderline cardiomegaly, accentuated by rotation. Stable aortic tortuosity.  Linear opacities in the bilateral lower lungs, subsegmental atelectasis. There is a small left pleural effusion noted laterally. No edema or pneumothorax.  IMPRESSION: Mild bibasilar atelectasis and small left pleural effusion.   Electronically Signed   By: Monte Fantasia M.D.   On: 04/06/2015 21:22   Dg Chest 2 View  04/01/2015   CLINICAL DATA:  Upper  right chest pain for 2 days  EXAM: CHEST  2 VIEW  COMPARISON:  01/26/2015  FINDINGS: Cardiac shadow is stable. The lungs are well aerated bilaterally. Calcified granuloma is again noted in the right apex. Eventration of the right hemidiaphragm is noted. Mild tortuosity of the thoracic aorta is again seen. Calcification of the aorta is noted.  IMPRESSION: No acute abnormality noted.   Electronically Signed   By: Inez Catalina M.D.   On: 04/01/2015 08:01   Dg Chest Port 1 View  04/07/2015   CLINICAL DATA:  Weakness, sepsis.  EXAM: PORTABLE CHEST - 1 VIEW  COMPARISON:  04/06/2015  FINDINGS: Lungs are somewhat hypoinflated with mild linear density over the right base and left mid to lower lung without significant change likely atelectasis. No significant effusion and no evidence of pneumothorax. Cardiomediastinal silhouette is within normal. There is calcified plaque over the aortic arch. Remainder of the exam is unchanged.  IMPRESSION: Stable bibasilar linear density likely atelectasis.   Electronically Signed   By: Marin Olp M.D.   On: 04/07/2015 08:03    Microbiology: Recent Results (from the past 240 hour(s))  Culture, blood (routine x 2)     Status: None (Preliminary result)   Collection Time: 04/06/15  8:55 PM  Result Value Ref Range Status   Specimen Description BLOOD RAC  Final   Special Requests BOTTLES DRAWN AEROBIC AND ANAEROBIC 5CC  Final   Culture   Final           BLOOD CULTURE RECEIVED NO GROWTH TO DATE CULTURE WILL BE HELD FOR 5 DAYS BEFORE ISSUING A FINAL NEGATIVE REPORT Performed at [facility]    Report Status PENDING  Incomplete  Urine culture     Status: None   Collection Time: 04/06/15  10:11 PM  Result Value Ref Range Status   Specimen Description URINE, CATHETERIZED  Final   Special Requests NONE  Final   Colony Count NO GROWTH Performed at [facility]   Final   Culture NO GROWTH Performed at [facility]   Final   Report Status 04/08/2015  FINAL  Final  Culture, blood (x 2)     Status: None (Preliminary result)   Collection Time: 04/07/15  1:47 AM  Result Value Ref Range Status   Specimen Description BLOOD LAC  Final   Special Requests BOTTLES DRAWN AEROBIC ONLY 4ML  Final   Culture   Final           BLOOD CULTURE RECEIVED NO GROWTH TO DATE CULTURE WILL BE HELD FOR 5 DAYS BEFORE ISSUING A FINAL NEGATIVE REPORT Performed at [facility]    Report Status PENDING  Incomplete  Clostridium Difficile by PCR     Status: None   Collection Time: 04/08/15 11:55 AM  Result Value Ref Range Status   C difficile by pcr NEGATIVE NEGATIVE Final     Labs: Basic Metabolic Panel:  Recent Labs Lab 04/06/15 2102 04/07/15 0147  NA 134* 134*  K 4.2 3.9  CL 101 104  CO2 24 21*  GLUCOSE 113* 130*  BUN 16 16  CREATININE 0.93 0.90  CALCIUM 10.1 9.8   Liver Function Tests:  Recent Labs Lab 04/06/15 2102 04/07/15 0147  AST 13* 14*  ALT 8* 9*  ALKPHOS 34* 32*  BILITOT 0.3 0.3  PROT 7.4 7.4  ALBUMIN 3.5 3.5   No results for input(s): LIPASE, AMYLASE in the last 168 hours. No results for input(s): AMMONIA in the last 168 hours. CBC:  Recent Labs Lab 04/06/15 2102 04/07/15 0147  WBC 5.8 5.7  NEUTROABS 4.0 3.8  HGB 9.1* 9.3*  HCT 29.4* 28.9*  MCV 71.5* 71.2*  PLT 195 152   Cardiac Enzymes: No results for input(s): CKTOTAL, CKMB, CKMBINDEX, TROPONINI in the last 168 hours. BNP: BNP (last 3 results)  Recent Labs  01/25/15 2336  BNP 179.2*    ProBNP (last 3 results)  Recent Labs  04/29/14 1050  PROBNP 372.7    CBG:  Recent Labs Lab 04/08/15 1709 04/08/15 2122 04/09/15 0731  GLUCAP 129* 123* 137*

## 2015-04-09 NOTE — Discharge Instructions (Signed)

## 2015-04-09 NOTE — Care Management Note (Addendum)
Case Management Note  Patient Details  Name: [name] MRN: [id number] Date of Birth: 1924/05/09  Subjective/Objective:     81 yo female admitted with fever, UTI               Action/Plan: Contacted patient's daughter, [name], and she stated that her and her brother live with the patient and provided care for her. She stated that the patient's mobility varies from day to day. She did not feel that they need any other DME and would like [facility] services as they felt they were very helpful before. No other questions or concerns.  Patient stated that her daughter and son live with her. She has a walker, and shower chair and has had [facility] RN and PT in the past with [place] and feels these services would be helpful upon discharge. Communicated referral fro [facility] RN and PT to [employer] rep. Expected Discharge Date:                  Expected Discharge Plan:  [place]  In-House Referral:  Clinical Social Work  Discharge planning Services  CM Consult  Post Acute Care Choice:  NA Choice offered to:  NA  DME Arranged:    DME Agency:     HH Arranged:  RN [place] Agency:  [place]  Status of Service:     Medicare Important Message Given:  N/A - LOS <3 / Initial given by admissions Date Medicare IM Given:    Medicare IM give by:    Date Additional Medicare IM Given:    Additional Medicare Important Message give by:     If discussed at [place] of Stay Meetings, dates discussed:    Additional Comments:  Scot Dock, RN 04/09/2015, 10:00 AM

## 2015-04-09 NOTE — Evaluation (Signed)
Physical Therapy Evaluation Patient Details Name: [name] MRN: [id number] DOB: [birth year] Today's Date: 04/09/2015   History of Present Illness  81 y.o. Female with past medical history of diabetes, hypertension, dyslipidemia who presented from home with fevers and altered mental status over past 1-2 days prior to this admission on 04/06/15 She was recently treated for UTI.   Clinical Impression  Evaluation limited due to c/o pain of both feet. Unable to  Assist standing  At this time. Patient will benefit from PT to address problems listed in note below, No family present to discus PLOF and Dc plan.     Follow Up Recommendations Home health PT;Supervision/Assistance - 24 hour (snf if family unable to provide care. )    Equipment Recommendations  Wheelchair (measurements PT) (if does not have one)    Recommendations for Other Services       Precautions / Restrictions Precautions Precautions: Fall Precaution Comments: bil foot pain      Mobility  Bed Mobility Overal bed mobility: Needs Assistance Bed Mobility: Supine to Sit;Sit to Supine     Supine to sit: Max assist Sit to supine: Mod assist   General bed mobility comments: extra time and assist for trunk to upright, assist legs onto bed/  Transfers                 General transfer comment: unable to attempt due to c/o pain  Ambulation/Gait                Stairs            Wheelchair Mobility    Modified Rankin (Stroke Patients Only)       Balance Overall balance assessment: Needs assistance Sitting-balance support: Feet unsupported;Bilateral upper extremity supported Sitting balance-Leahy Scale: Poor   Postural control: Posterior lean                                   Pertinent Vitals/Pain Pain Assessment: Faces Faces Pain Scale: Hurts worst Pain Descriptors / Indicators: Discomfort;Grimacing;Guarding;Restless Pain Intervention(s): Limited activity within  patient's tolerance    Home Living   Living Arrangements: Children               Additional Comments: unsure, no family present. pt reports living with daughter and son    Prior Function           Comments: unsure, patient reports that she is able to walk      Hand Dominance        Extremity/Trunk Assessment   Upper Extremity Assessment: Overall WFL for tasks assessed           Lower Extremity Assessment: RLE deficits/detail;LLE deficits/detail RLE Deficits / Details: patient unable to tolerate any pressure on feet, even to touch to move them LLE Deficits / Details: same as R  Cervical / Trunk Assessment: Kyphotic  Communication   Communication: No difficulties  Cognition Arousal/Alertness: Awake/alert   Overall Cognitive Status: No family/caregiver present to determine baseline cognitive functioning                 General Comments: oriented to place    General Comments      Exercises        Assessment/Plan    PT Assessment Patient needs continued PT services  PT Diagnosis Difficulty walking;Acute pain   PT Problem List Decreased strength;Decreased range of motion;Decreased activity tolerance;Decreased mobility;Pain  PT Treatment  Interventions DME instruction;Gait training;Functional mobility training;Therapeutic activities;Therapeutic exercise;Patient/family education   PT Goals (Current goals can be found in the Care Plan section) Acute Rehab PT Goals PT Goal Formulation: Patient unable to participate in goal setting Time For Goal Achievement: 04/23/15 Potential to Achieve Goals: Fair    Frequency Min 3X/week   Barriers to discharge        Co-evaluation               End of Session   Activity Tolerance: Patient limited by pain Patient left: in bed;with call bell/phone within reach;with bed alarm set Nurse Communication: Mobility status         Time: 5686-1683 PT Time Calculation (min) (ACUTE ONLY): 16  min   Charges:   PT Evaluation $Initial PT Evaluation Tier I: 1 Procedure     PT G CodesClaretha [name] 04/09/2015, 10:16 AM [name] PT [phone]

## 2015-04-10 DIAGNOSIS — N390 Urinary tract infection, site not specified: Secondary | ICD-10-CM | POA: Diagnosis not present

## 2015-04-10 DIAGNOSIS — E1142 Type 2 diabetes mellitus with diabetic polyneuropathy: Secondary | ICD-10-CM | POA: Diagnosis not present

## 2015-04-10 DIAGNOSIS — I2510 Atherosclerotic heart disease of native coronary artery without angina pectoris: Secondary | ICD-10-CM | POA: Diagnosis not present

## 2015-04-10 DIAGNOSIS — I5022 Chronic systolic (congestive) heart failure: Secondary | ICD-10-CM | POA: Diagnosis not present

## 2015-04-10 DIAGNOSIS — J45909 Unspecified asthma, uncomplicated: Secondary | ICD-10-CM | POA: Diagnosis not present

## 2015-04-10 DIAGNOSIS — I10 Essential (primary) hypertension: Secondary | ICD-10-CM | POA: Diagnosis not present

## 2015-04-13 LAB — CULTURE, BLOOD (ROUTINE X 2)
Culture: NO GROWTH
Culture: NO GROWTH

## 2015-04-14 ENCOUNTER — Ambulatory Visit (INDEPENDENT_AMBULATORY_CARE_PROVIDER_SITE_OTHER): Payer: Medicare Other | Admitting: Family Medicine

## 2015-04-14 ENCOUNTER — Encounter: Payer: Self-pay | Admitting: Family Medicine

## 2015-04-14 VITALS — BP 117/50 | HR 72 | Temp 98.2°F | Ht 59.0 in | Wt 112.4 lb

## 2015-04-14 DIAGNOSIS — N390 Urinary tract infection, site not specified: Secondary | ICD-10-CM | POA: Diagnosis not present

## 2015-04-14 DIAGNOSIS — R634 Abnormal weight loss: Secondary | ICD-10-CM | POA: Diagnosis not present

## 2015-04-14 DIAGNOSIS — R410 Disorientation, unspecified: Secondary | ICD-10-CM

## 2015-04-14 DIAGNOSIS — M150 Primary generalized (osteo)arthritis: Secondary | ICD-10-CM | POA: Diagnosis not present

## 2015-04-14 DIAGNOSIS — M159 Polyosteoarthritis, unspecified: Secondary | ICD-10-CM

## 2015-04-14 DIAGNOSIS — I10 Essential (primary) hypertension: Secondary | ICD-10-CM

## 2015-04-14 DIAGNOSIS — R112 Nausea with vomiting, unspecified: Secondary | ICD-10-CM | POA: Diagnosis present

## 2015-04-14 NOTE — Patient Instructions (Signed)
I am happy things are going well. No changes in your medication. Keep eating and taking the ensure - I don't want you losing any more weight. See Dr. Lorenso Courier in one month. Use the wrist splint to help your pain.

## 2015-04-15 DIAGNOSIS — J45909 Unspecified asthma, uncomplicated: Secondary | ICD-10-CM | POA: Diagnosis not present

## 2015-04-15 DIAGNOSIS — F05 Delirium due to known physiological condition: Secondary | ICD-10-CM | POA: Insufficient documentation

## 2015-04-15 DIAGNOSIS — N390 Urinary tract infection, site not specified: Secondary | ICD-10-CM | POA: Diagnosis not present

## 2015-04-15 DIAGNOSIS — I10 Essential (primary) hypertension: Secondary | ICD-10-CM | POA: Diagnosis not present

## 2015-04-15 DIAGNOSIS — I5022 Chronic systolic (congestive) heart failure: Secondary | ICD-10-CM | POA: Diagnosis not present

## 2015-04-15 DIAGNOSIS — E1142 Type 2 diabetes mellitus with diabetic polyneuropathy: Secondary | ICD-10-CM | POA: Diagnosis not present

## 2015-04-15 DIAGNOSIS — I2510 Atherosclerotic heart disease of native coronary artery without angina pectoris: Secondary | ICD-10-CM | POA: Diagnosis not present

## 2015-04-15 NOTE — Assessment & Plan Note (Addendum)
Now recovered from complicated UTI that landed her in the hospital.  Given her lack of urgency and frequency, will need to have a low threshold for doing UA in the future.

## 2015-04-15 NOTE — Progress Notes (Signed)
   Subjective:    Patient ID: [name], female    DOB: May 05, 1924, 81 y.o.   MRN: [id number]  HPI Follow up hospitalization for delirium, fever and complicated UTI.  Her symptoms have all cleared and she feels back to her baseline. She did not have any urgency, frequency or dysuria now or leading up to her hospitalization.  Her first symptom was fever followed by confusion.   Reviewed hospital DC summary.  No pending labs or specific follow up labs. Her weight is down a little.  Appetite prior to hospitalization was not great.  She is using supplemental ensure. Also complains of left wrist pain.  No trauma.  This has been a recurrent problem for her.  She has an old brace, which is now too large due to wear and her weight loss.    Review of Systems     Objective:   Physical Exam Lungs clear Cardiac RRR with 2/6 SEM Abd benign. Ext no edema Left wrist tender at the wrist joint with most pain on the radial side.  No pain to thumb movement. No old left wrist x rays but 2013 right wrist films show degenerative changes.        Assessment & Plan:

## 2015-04-15 NOTE — Assessment & Plan Note (Signed)
Well controled on current meds 

## 2015-04-15 NOTE — Assessment & Plan Note (Signed)
Feel left wrist pain is likely degenerative arthritis.  Will treat conservatively with splint.

## 2015-04-15 NOTE — Assessment & Plan Note (Signed)
Down slightly again which is easily attributed to the recent hospitalization.  Continue ensure.

## 2015-04-15 NOTE — Assessment & Plan Note (Signed)
In hospital, now resolved.  Noticing her problem list contains "memory problems" and given her age, it is highly likely that she has early dementia making her more prone to delerium.  No intervention for now.  Consider geri assessment clinic referral if concerns persist.

## 2015-04-16 ENCOUNTER — Telehealth: Payer: Self-pay | Admitting: *Deleted

## 2015-04-16 DIAGNOSIS — N390 Urinary tract infection, site not specified: Secondary | ICD-10-CM | POA: Diagnosis not present

## 2015-04-16 DIAGNOSIS — I5022 Chronic systolic (congestive) heart failure: Secondary | ICD-10-CM | POA: Diagnosis not present

## 2015-04-16 DIAGNOSIS — J45909 Unspecified asthma, uncomplicated: Secondary | ICD-10-CM | POA: Diagnosis not present

## 2015-04-16 DIAGNOSIS — I10 Essential (primary) hypertension: Secondary | ICD-10-CM | POA: Diagnosis not present

## 2015-04-16 DIAGNOSIS — I2510 Atherosclerotic heart disease of native coronary artery without angina pectoris: Secondary | ICD-10-CM | POA: Diagnosis not present

## 2015-04-16 DIAGNOSIS — E1142 Type 2 diabetes mellitus with diabetic polyneuropathy: Secondary | ICD-10-CM | POA: Diagnosis not present

## 2015-04-16 NOTE — Telephone Encounter (Signed)
Home health nurse calling stating that patient has a superficial open area on her buttock that they will be treating with vaseline gauze and padding. Also family is considering nursing home placement for patient so she will need orders for social work. They will be faxing over orders for MD to sign. FYI to MD

## 2015-04-19 DIAGNOSIS — N390 Urinary tract infection, site not specified: Secondary | ICD-10-CM | POA: Diagnosis not present

## 2015-04-19 DIAGNOSIS — I10 Essential (primary) hypertension: Secondary | ICD-10-CM | POA: Diagnosis not present

## 2015-04-19 DIAGNOSIS — J45909 Unspecified asthma, uncomplicated: Secondary | ICD-10-CM | POA: Diagnosis not present

## 2015-04-19 DIAGNOSIS — E1142 Type 2 diabetes mellitus with diabetic polyneuropathy: Secondary | ICD-10-CM | POA: Diagnosis not present

## 2015-04-19 DIAGNOSIS — I5022 Chronic systolic (congestive) heart failure: Secondary | ICD-10-CM | POA: Diagnosis not present

## 2015-04-19 DIAGNOSIS — I2510 Atherosclerotic heart disease of native coronary artery without angina pectoris: Secondary | ICD-10-CM | POA: Diagnosis not present

## 2015-04-21 DIAGNOSIS — J45909 Unspecified asthma, uncomplicated: Secondary | ICD-10-CM | POA: Diagnosis not present

## 2015-04-21 DIAGNOSIS — I2510 Atherosclerotic heart disease of native coronary artery without angina pectoris: Secondary | ICD-10-CM | POA: Diagnosis not present

## 2015-04-21 DIAGNOSIS — I10 Essential (primary) hypertension: Secondary | ICD-10-CM | POA: Diagnosis not present

## 2015-04-21 DIAGNOSIS — I5022 Chronic systolic (congestive) heart failure: Secondary | ICD-10-CM | POA: Diagnosis not present

## 2015-04-21 DIAGNOSIS — E1142 Type 2 diabetes mellitus with diabetic polyneuropathy: Secondary | ICD-10-CM | POA: Diagnosis not present

## 2015-04-21 DIAGNOSIS — N390 Urinary tract infection, site not specified: Secondary | ICD-10-CM | POA: Diagnosis not present

## 2015-04-23 DIAGNOSIS — I2510 Atherosclerotic heart disease of native coronary artery without angina pectoris: Secondary | ICD-10-CM | POA: Diagnosis not present

## 2015-04-23 DIAGNOSIS — E1142 Type 2 diabetes mellitus with diabetic polyneuropathy: Secondary | ICD-10-CM | POA: Diagnosis not present

## 2015-04-23 DIAGNOSIS — N390 Urinary tract infection, site not specified: Secondary | ICD-10-CM | POA: Diagnosis not present

## 2015-04-23 DIAGNOSIS — I5022 Chronic systolic (congestive) heart failure: Secondary | ICD-10-CM | POA: Diagnosis not present

## 2015-04-23 DIAGNOSIS — I10 Essential (primary) hypertension: Secondary | ICD-10-CM | POA: Diagnosis not present

## 2015-04-23 DIAGNOSIS — J45909 Unspecified asthma, uncomplicated: Secondary | ICD-10-CM | POA: Diagnosis not present

## 2015-04-26 DIAGNOSIS — I10 Essential (primary) hypertension: Secondary | ICD-10-CM | POA: Diagnosis not present

## 2015-04-26 DIAGNOSIS — I2510 Atherosclerotic heart disease of native coronary artery without angina pectoris: Secondary | ICD-10-CM | POA: Diagnosis not present

## 2015-04-26 DIAGNOSIS — E1142 Type 2 diabetes mellitus with diabetic polyneuropathy: Secondary | ICD-10-CM | POA: Diagnosis not present

## 2015-04-26 DIAGNOSIS — J45909 Unspecified asthma, uncomplicated: Secondary | ICD-10-CM | POA: Diagnosis not present

## 2015-04-26 DIAGNOSIS — I5022 Chronic systolic (congestive) heart failure: Secondary | ICD-10-CM | POA: Diagnosis not present

## 2015-04-26 DIAGNOSIS — N390 Urinary tract infection, site not specified: Secondary | ICD-10-CM | POA: Diagnosis not present

## 2015-04-28 DIAGNOSIS — J45909 Unspecified asthma, uncomplicated: Secondary | ICD-10-CM | POA: Diagnosis not present

## 2015-04-28 DIAGNOSIS — I5022 Chronic systolic (congestive) heart failure: Secondary | ICD-10-CM | POA: Diagnosis not present

## 2015-04-28 DIAGNOSIS — I2510 Atherosclerotic heart disease of native coronary artery without angina pectoris: Secondary | ICD-10-CM | POA: Diagnosis not present

## 2015-04-28 DIAGNOSIS — I10 Essential (primary) hypertension: Secondary | ICD-10-CM | POA: Diagnosis not present

## 2015-04-28 DIAGNOSIS — N390 Urinary tract infection, site not specified: Secondary | ICD-10-CM | POA: Diagnosis not present

## 2015-04-28 DIAGNOSIS — E1142 Type 2 diabetes mellitus with diabetic polyneuropathy: Secondary | ICD-10-CM | POA: Diagnosis not present

## 2015-04-29 ENCOUNTER — Other Ambulatory Visit: Payer: Self-pay | Admitting: Family Medicine

## 2015-04-29 DIAGNOSIS — M546 Pain in thoracic spine: Secondary | ICD-10-CM

## 2015-04-29 NOTE — Telephone Encounter (Signed)
Daughter called and said that her mother needs a refill on her pain medication. jw

## 2015-04-30 DIAGNOSIS — N390 Urinary tract infection, site not specified: Secondary | ICD-10-CM | POA: Diagnosis not present

## 2015-04-30 DIAGNOSIS — I10 Essential (primary) hypertension: Secondary | ICD-10-CM | POA: Diagnosis not present

## 2015-04-30 DIAGNOSIS — I5022 Chronic systolic (congestive) heart failure: Secondary | ICD-10-CM | POA: Diagnosis not present

## 2015-04-30 DIAGNOSIS — J45909 Unspecified asthma, uncomplicated: Secondary | ICD-10-CM | POA: Diagnosis not present

## 2015-04-30 DIAGNOSIS — I2510 Atherosclerotic heart disease of native coronary artery without angina pectoris: Secondary | ICD-10-CM | POA: Diagnosis not present

## 2015-04-30 DIAGNOSIS — E1142 Type 2 diabetes mellitus with diabetic polyneuropathy: Secondary | ICD-10-CM | POA: Diagnosis not present

## 2015-04-30 MED ORDER — TRAMADOL HCL 50 MG PO TABS: 50.0000 mg | ORAL_TABLET | Freq: Two times a day (BID) | ORAL | Status: DC | PRN

## 2015-04-30 NOTE — Telephone Encounter (Signed)
Tramadol refilled. At the front desk.  Thanks, [name], MD [facility] Family Medicine Resident  04/30/2015, 12:09 PM

## 2015-04-30 NOTE — Telephone Encounter (Signed)
Pt called to check the status of her request on her pain medication. jw

## 2015-05-01 ENCOUNTER — Other Ambulatory Visit: Payer: Self-pay | Admitting: Family Medicine

## 2015-05-03 ENCOUNTER — Other Ambulatory Visit: Payer: Self-pay | Admitting: *Deleted

## 2015-05-03 DIAGNOSIS — N390 Urinary tract infection, site not specified: Secondary | ICD-10-CM | POA: Diagnosis not present

## 2015-05-03 DIAGNOSIS — J45909 Unspecified asthma, uncomplicated: Secondary | ICD-10-CM | POA: Diagnosis not present

## 2015-05-03 DIAGNOSIS — M546 Pain in thoracic spine: Secondary | ICD-10-CM

## 2015-05-03 DIAGNOSIS — I2510 Atherosclerotic heart disease of native coronary artery without angina pectoris: Secondary | ICD-10-CM | POA: Diagnosis not present

## 2015-05-03 DIAGNOSIS — I10 Essential (primary) hypertension: Secondary | ICD-10-CM | POA: Diagnosis not present

## 2015-05-03 DIAGNOSIS — I5022 Chronic systolic (congestive) heart failure: Secondary | ICD-10-CM | POA: Diagnosis not present

## 2015-05-03 DIAGNOSIS — E1142 Type 2 diabetes mellitus with diabetic polyneuropathy: Secondary | ICD-10-CM | POA: Diagnosis not present

## 2015-05-03 NOTE — Telephone Encounter (Signed)
Pt informed. Deseree Blount, CMA  

## 2015-05-05 DIAGNOSIS — N390 Urinary tract infection, site not specified: Secondary | ICD-10-CM | POA: Diagnosis not present

## 2015-05-05 DIAGNOSIS — J45909 Unspecified asthma, uncomplicated: Secondary | ICD-10-CM | POA: Diagnosis not present

## 2015-05-05 DIAGNOSIS — I5022 Chronic systolic (congestive) heart failure: Secondary | ICD-10-CM | POA: Diagnosis not present

## 2015-05-05 DIAGNOSIS — E1142 Type 2 diabetes mellitus with diabetic polyneuropathy: Secondary | ICD-10-CM | POA: Diagnosis not present

## 2015-05-05 DIAGNOSIS — I10 Essential (primary) hypertension: Secondary | ICD-10-CM | POA: Diagnosis not present

## 2015-05-05 DIAGNOSIS — I2510 Atherosclerotic heart disease of native coronary artery without angina pectoris: Secondary | ICD-10-CM | POA: Diagnosis not present

## 2015-05-07 DIAGNOSIS — I5022 Chronic systolic (congestive) heart failure: Secondary | ICD-10-CM | POA: Diagnosis not present

## 2015-05-07 DIAGNOSIS — I10 Essential (primary) hypertension: Secondary | ICD-10-CM | POA: Diagnosis not present

## 2015-05-07 DIAGNOSIS — I2510 Atherosclerotic heart disease of native coronary artery without angina pectoris: Secondary | ICD-10-CM | POA: Diagnosis not present

## 2015-05-07 DIAGNOSIS — J45909 Unspecified asthma, uncomplicated: Secondary | ICD-10-CM | POA: Diagnosis not present

## 2015-05-07 DIAGNOSIS — N390 Urinary tract infection, site not specified: Secondary | ICD-10-CM | POA: Diagnosis not present

## 2015-05-07 DIAGNOSIS — E1142 Type 2 diabetes mellitus with diabetic polyneuropathy: Secondary | ICD-10-CM | POA: Diagnosis not present

## 2015-05-10 DIAGNOSIS — E1142 Type 2 diabetes mellitus with diabetic polyneuropathy: Secondary | ICD-10-CM | POA: Diagnosis not present

## 2015-05-10 DIAGNOSIS — J45909 Unspecified asthma, uncomplicated: Secondary | ICD-10-CM | POA: Diagnosis not present

## 2015-05-10 DIAGNOSIS — I2510 Atherosclerotic heart disease of native coronary artery without angina pectoris: Secondary | ICD-10-CM | POA: Diagnosis not present

## 2015-05-10 DIAGNOSIS — I10 Essential (primary) hypertension: Secondary | ICD-10-CM | POA: Diagnosis not present

## 2015-05-10 DIAGNOSIS — I5022 Chronic systolic (congestive) heart failure: Secondary | ICD-10-CM | POA: Diagnosis not present

## 2015-05-10 DIAGNOSIS — N390 Urinary tract infection, site not specified: Secondary | ICD-10-CM | POA: Diagnosis not present

## 2015-05-11 ENCOUNTER — Encounter: Payer: Self-pay | Admitting: Family Medicine

## 2015-05-11 ENCOUNTER — Ambulatory Visit
Admission: RE | Admit: 2015-05-11 | Discharge: 2015-05-11 | Disposition: A | Discharge status: Home / Self Care | Payer: Medicare Other | Source: Ambulatory Visit | Attending: Family Medicine | Admitting: Family Medicine

## 2015-05-11 ENCOUNTER — Ambulatory Visit (INDEPENDENT_AMBULATORY_CARE_PROVIDER_SITE_OTHER): Payer: Medicare Other | Admitting: Family Medicine

## 2015-05-11 VITALS — BP 114/41 | HR 73 | Temp 98.3°F | Wt 113.0 lb

## 2015-05-11 DIAGNOSIS — M7989 Other specified soft tissue disorders: Secondary | ICD-10-CM | POA: Diagnosis not present

## 2015-05-11 DIAGNOSIS — R112 Nausea with vomiting, unspecified: Secondary | ICD-10-CM | POA: Diagnosis present

## 2015-05-11 DIAGNOSIS — M11242 Other chondrocalcinosis, left hand: Secondary | ICD-10-CM | POA: Diagnosis not present

## 2015-05-11 DIAGNOSIS — M1812 Unilateral primary osteoarthritis of first carpometacarpal joint, left hand: Secondary | ICD-10-CM | POA: Diagnosis not present

## 2015-05-11 MED ORDER — COLCHICINE 0.6 MG PO TABS: ORAL_TABLET | ORAL | Status: DC

## 2015-05-11 NOTE — Patient Instructions (Signed)
This might be gout. Take colchicine two pills now, then one pill one hour later Go get xray of hand Return to the clinic in 2-3 days to have your hand rechecked Return sooner if you have fevers, chills, vomiting, worsening pain/swelling, or any other issues.  Be well, Dr. Ardelia Mems

## 2015-05-11 NOTE — Progress Notes (Signed)
Patient ID: [name], female   DOB: 1924-08-31, 81 y.o.   MRN: [id number]  HPI:  Pt presents for a same day appointment to discuss left hand swelling.  Swelling began last Monday (8 days ago). Has hx of swelling like this in past. Does have hx of gout and has taken colchicine in the past. Did not injure the hand. Two days ago had subjective fever and vomiting, but that is not going on anymore. Pain happens only when she tries to move the hand or fingers.  ROS: See HPI  [place]: hx HTN, anemia, asthma, breast cancer, CHF, CAD, T2DM, HLD, arthritis  PHYSICAL EXAM: BP 114/41 mmHg  Pulse 73  Temp(Src) 98.3 F (36.8 C) (Oral)  Wt 113 lb (51.256 kg) Gen: NAD HEENT: NCAT Lungs: NWOB Ext: L hand with swelling along dorsum of hand. No gross deformity. Unable to flex fingers much actively. Marked pain with passive extension of fingers 2-4. No erythema. Some warmth compared to R hand. Good sensation and capillary refill in distal fingers of L hand. 2+ radial pulse on L.  ASSESSMENT/PLAN:  1. L hand pain: etiology not 100% clear on exam or history. Differential includes gout flare, fracture, flare of osteoarthritis, among other things. Pt well appearing without any current symptoms of systemic illness so also doubt infectious etiology.  -Will plan to treat for gout flare with colchicine 1.2mg  now, then 0.6mg  in one hour.  -Also obtain xray of hand.  -Pt to f/u in 2-3 days for repeat evaluation, sooner if any worsening, fevers/chills, etc.  FOLLOW UP: F/u in 2-3 days for repeat eval of hand swelling.  [place]. Ardelia Mems, [place]

## 2015-05-12 DIAGNOSIS — N390 Urinary tract infection, site not specified: Secondary | ICD-10-CM | POA: Diagnosis not present

## 2015-05-12 DIAGNOSIS — I5022 Chronic systolic (congestive) heart failure: Secondary | ICD-10-CM | POA: Diagnosis not present

## 2015-05-12 DIAGNOSIS — I2510 Atherosclerotic heart disease of native coronary artery without angina pectoris: Secondary | ICD-10-CM | POA: Diagnosis not present

## 2015-05-12 DIAGNOSIS — J45909 Unspecified asthma, uncomplicated: Secondary | ICD-10-CM | POA: Diagnosis not present

## 2015-05-12 DIAGNOSIS — I10 Essential (primary) hypertension: Secondary | ICD-10-CM | POA: Diagnosis not present

## 2015-05-12 DIAGNOSIS — E1142 Type 2 diabetes mellitus with diabetic polyneuropathy: Secondary | ICD-10-CM | POA: Diagnosis not present

## 2015-05-13 ENCOUNTER — Other Ambulatory Visit: Payer: Self-pay | Admitting: Family Medicine

## 2015-05-13 ENCOUNTER — Telehealth: Payer: Self-pay | Admitting: Family Medicine

## 2015-05-13 MED ORDER — ROSUVASTATIN CALCIUM 20 MG PO TABS: 20.0000 mg | ORAL_TABLET | Freq: Every day | ORAL | Status: DC

## 2015-05-13 NOTE — Telephone Encounter (Signed)
Refill request for Crestor.

## 2015-05-13 NOTE — Telephone Encounter (Signed)
Called pt to let her know xray negative for fracture. She states hand is doing a lot better. Encouraged her to keep appointment tomorrow for recheck. Pt appreciative.  Leeanne Rio, MD

## 2015-05-14 ENCOUNTER — Ambulatory Visit (INDEPENDENT_AMBULATORY_CARE_PROVIDER_SITE_OTHER): Payer: Medicare Other | Admitting: Family Medicine

## 2015-05-14 ENCOUNTER — Encounter: Payer: Self-pay | Admitting: Family Medicine

## 2015-05-14 VITALS — BP 166/63 | HR 80 | Temp 98.2°F | Ht 59.0 in | Wt 111.6 lb

## 2015-05-14 DIAGNOSIS — R112 Nausea with vomiting, unspecified: Secondary | ICD-10-CM | POA: Diagnosis present

## 2015-05-14 DIAGNOSIS — M1A09X0 Idiopathic chronic gout, multiple sites, without tophus (tophi): Secondary | ICD-10-CM

## 2015-05-14 DIAGNOSIS — M109 Gout, unspecified: Secondary | ICD-10-CM | POA: Insufficient documentation

## 2015-05-14 MED ORDER — COLCHICINE 0.6 MG PO TABS: ORAL_TABLET | ORAL | Status: DC

## 2015-05-14 NOTE — Progress Notes (Signed)
   Subjective:    Patient ID: [name], female    DOB: 1924-03-08, 81 y.o.   MRN: [id number]  HPI   LEFT HAND SWELLING / PAIN / GOUT FOLLOW-UP: - Recent visit to [facility] on 6/28 for same complaint of Left Hand swelling, redness, warmth, and pain. At that time stated that symptoms had been present for about 8 days. At that time she had dorsal swelling, with some pain on passive flexion of fingers, mild warmth without erythema. Suspected gout flare, treated with colchicine 1.2mg  x 1 and then 0.6mg  1 hour later. - Left hand X-ray 6/28 - Scattered degenerative changes and chondrocalcinosis question CPPD, osseous demineralization. - Today she presents for follow-up, reports significant resolution without any current symptoms. No further swelling or pain. Now able to move hand and fingers without problems, able to grip and use walker well. States resolved with colchicine. - Chronic history of gout, intermittent flares over >5 years, ankle and hands/wrist. Used to be on colchicine for prevention, stopped few years ago due to cost. Never been on allopurinol. - Denies any fevers/chills, other joint pain, swelling, or redness, nausea / vomiting  I have reviewed and updated the following as appropriate: allergies and current medications  Social Hx: - never smoker - ambulates with walker  Review of Systems  See above HPI    Objective:   Physical Exam  BP 166/63 mmHg  Pulse 80  Temp(Src) 98.2 F (36.8 C) (Oral)  Ht 4\' 11"  (1.499 m)  Wt 111 lb 9.6 oz (50.621 kg)  BMI 22.53 kg/m2  Gen - well-appearing elderly F, comfortable and pleasant, NAD MSK/Ext - Left hand/fingers: no edema, no erythema or warmth. Non-tender to palpation. Full active and passive ROM hand, wrist, and fingers. Grip 5/5 bilaterally. Peripheral pulses intact +2 b/l Skin - warm, dry, no rashes Neuro - awake, alert, oriented     Assessment & Plan:   See specific A&P problem list for details.

## 2015-05-14 NOTE — Patient Instructions (Signed)
Dear [name], Thank you for coming in to clinic today. It was good to see you!  1. You most likely had a gout flare. X-ray was negative for fracture or infection. Consistent with arthritis and gout. 2. Refilled Colchicine 0.6mg  tabs - if you develop another painful gout flare similar to this episode, you may take 2 tablets then repeat dose of 1 tablet in 1 hour. Stop taking it for next 3 days. 3. You may take ibuprofen or advil as needed for gout pain.  Avoid red meats and trigger foods  Please schedule a follow-up appointment with Dr. Lorenso Courier in future to discuss future Gout Flares and Prevention.  If you have any other questions or concerns, please feel free to call the clinic to contact me. You may also schedule an earlier appointment if necessary.  However, if your symptoms get significantly worse, please go to the Emergency Department to seek immediate medical attention.  [name] Putnam, DO [facility]   Gout Gout is when your joints become red, sore, and swell (inflamed). This is caused by the buildup of uric acid crystals in the joints. Uric acid is a chemical that is normally in the blood. If the level of uric acid gets too high in the blood, these crystals form in your joints and tissues. Over time, these crystals can form into masses near the joints and tissues. These masses can destroy bone and cause the bone to look misshapen (deformed). HOME CARE   Do not take aspirin for pain.  Only take medicine as told by your doctor.  Rest the joint as much as you can. When in bed, keep sheets and blankets off painful areas.  Keep the sore joints raised (elevated).  Put warm or cold packs on painful joints. Use of warm or cold packs depends on which works best for you.  Use crutches if the painful joint is in your leg.  Drink enough fluids to keep your pee (urine) clear or pale yellow. Limit alcohol, sugary drinks, and drinks with fructose in  them.  Follow your diet instructions. Pay careful attention to how much protein you eat. Include fruits, vegetables, whole grains, and fat-free or low-fat milk products in your daily diet. Talk to your doctor or dietitian about the use of coffee, vitamin C, and cherries. These may help lower uric acid levels.  Keep a healthy body weight. GET HELP RIGHT AWAY IF:   You have watery poop (diarrhea), throw up (vomit), or have any side effects from medicines.  You do not feel better in 24 hours, or you are getting worse.  Your joint becomes suddenly more tender, and you have chills or a fever. MAKE SURE YOU:   Understand these instructions.  Will watch your condition.  Will get help right away if you are not doing well or get worse. Document Released: 08/08/2008 Document Revised: 03/16/2014 Document Reviewed: 06/12/2012 [facility] Patient Information 2015 [name]. This information is not intended to replace advice given to you by your health care provider. Make sure you discuss any questions you have with your health care provider.

## 2015-05-14 NOTE — Assessment & Plan Note (Signed)
Chronic h/o gout flares >5+ years, not on prophylaxis. Follow-up today from likely most recent flare Left hand swelling, pain about 1.5-2 weeks ago. Resolved with colchicine. - Left hand X-ray (6/28) - Scattered degenerative changes and chondrocalcinosis question CPPD. Osseous demineralization - Afebrile, hand edema/pain resolved without concerns - Given above x-ray consider pseudogout vs gout, likely underlying arthritis  Plan: 1. Refilled Colchicine 0.6mg  tabs - instructions to take 1.2mg  with next gout flare, then repeat 0.6mg  in 1 hour. Given #9 tabs 0 refills, to have in case develops recurrent flare 2. Discussed allopurinol vs colchicine for prophylaxis. Patient declined at this time, did not want to take more daily medicines. Will consider again in future if recurrent flares. Kidney function is normal, would recommend daily 0.6mg  colchicine if proceeds with this. 3. RTC PRN

## 2015-05-16 ENCOUNTER — Other Ambulatory Visit: Payer: Self-pay | Admitting: Family Medicine

## 2015-05-20 DIAGNOSIS — J45909 Unspecified asthma, uncomplicated: Secondary | ICD-10-CM | POA: Diagnosis not present

## 2015-05-20 DIAGNOSIS — I2510 Atherosclerotic heart disease of native coronary artery without angina pectoris: Secondary | ICD-10-CM | POA: Diagnosis not present

## 2015-05-20 DIAGNOSIS — N390 Urinary tract infection, site not specified: Secondary | ICD-10-CM | POA: Diagnosis not present

## 2015-05-20 DIAGNOSIS — I10 Essential (primary) hypertension: Secondary | ICD-10-CM | POA: Diagnosis not present

## 2015-05-20 DIAGNOSIS — I5022 Chronic systolic (congestive) heart failure: Secondary | ICD-10-CM | POA: Diagnosis not present

## 2015-05-20 DIAGNOSIS — E1142 Type 2 diabetes mellitus with diabetic polyneuropathy: Secondary | ICD-10-CM | POA: Diagnosis not present

## 2015-05-23 ENCOUNTER — Other Ambulatory Visit: Payer: Self-pay | Admitting: Family Medicine

## 2015-05-24 ENCOUNTER — Other Ambulatory Visit: Payer: Self-pay | Admitting: *Deleted

## 2015-05-24 MED ORDER — GABAPENTIN 300 MG PO CAPS: 300.0000 mg | ORAL_CAPSULE | Freq: Two times a day (BID) | ORAL | Status: DC

## 2015-05-25 DIAGNOSIS — J45909 Unspecified asthma, uncomplicated: Secondary | ICD-10-CM | POA: Diagnosis not present

## 2015-05-25 DIAGNOSIS — E1142 Type 2 diabetes mellitus with diabetic polyneuropathy: Secondary | ICD-10-CM | POA: Diagnosis not present

## 2015-05-25 DIAGNOSIS — N390 Urinary tract infection, site not specified: Secondary | ICD-10-CM | POA: Diagnosis not present

## 2015-05-25 DIAGNOSIS — I2510 Atherosclerotic heart disease of native coronary artery without angina pectoris: Secondary | ICD-10-CM | POA: Diagnosis not present

## 2015-05-25 DIAGNOSIS — I10 Essential (primary) hypertension: Secondary | ICD-10-CM | POA: Diagnosis not present

## 2015-05-25 DIAGNOSIS — I5022 Chronic systolic (congestive) heart failure: Secondary | ICD-10-CM | POA: Diagnosis not present

## 2015-05-27 DIAGNOSIS — N390 Urinary tract infection, site not specified: Secondary | ICD-10-CM | POA: Diagnosis not present

## 2015-05-27 DIAGNOSIS — I10 Essential (primary) hypertension: Secondary | ICD-10-CM | POA: Diagnosis not present

## 2015-05-27 DIAGNOSIS — E1142 Type 2 diabetes mellitus with diabetic polyneuropathy: Secondary | ICD-10-CM | POA: Diagnosis not present

## 2015-05-27 DIAGNOSIS — I5022 Chronic systolic (congestive) heart failure: Secondary | ICD-10-CM | POA: Diagnosis not present

## 2015-05-27 DIAGNOSIS — I2510 Atherosclerotic heart disease of native coronary artery without angina pectoris: Secondary | ICD-10-CM | POA: Diagnosis not present

## 2015-05-27 DIAGNOSIS — J45909 Unspecified asthma, uncomplicated: Secondary | ICD-10-CM | POA: Diagnosis not present

## 2015-05-28 IMAGING — CR DG CHEST 2V
2 series · 2 of 2 positions shown · non-contrast
Comparison: 04/01/2015

CLINICAL DATA: Fever

EXAM:
CHEST  2 VIEW

[w chest lat]
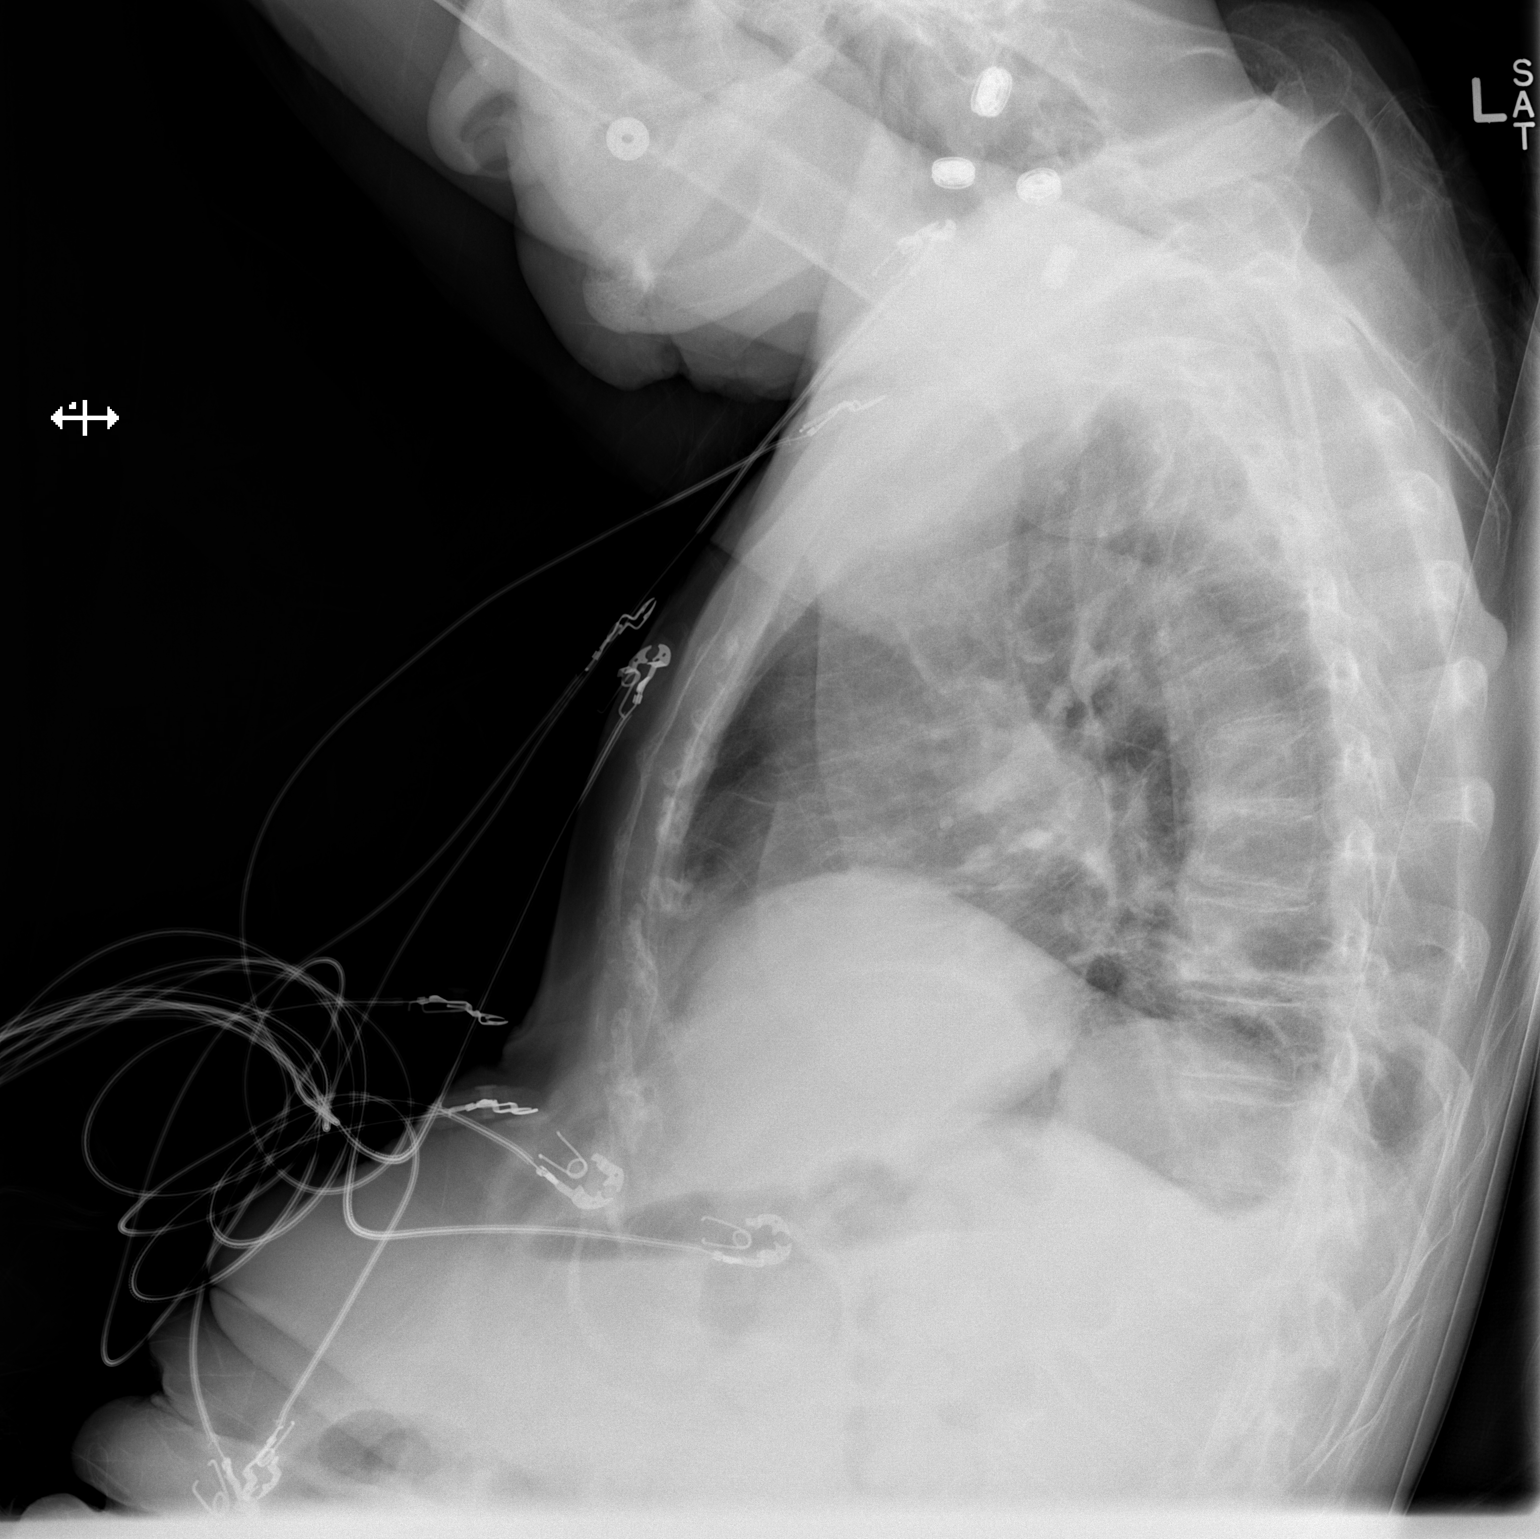

[x chest ap]
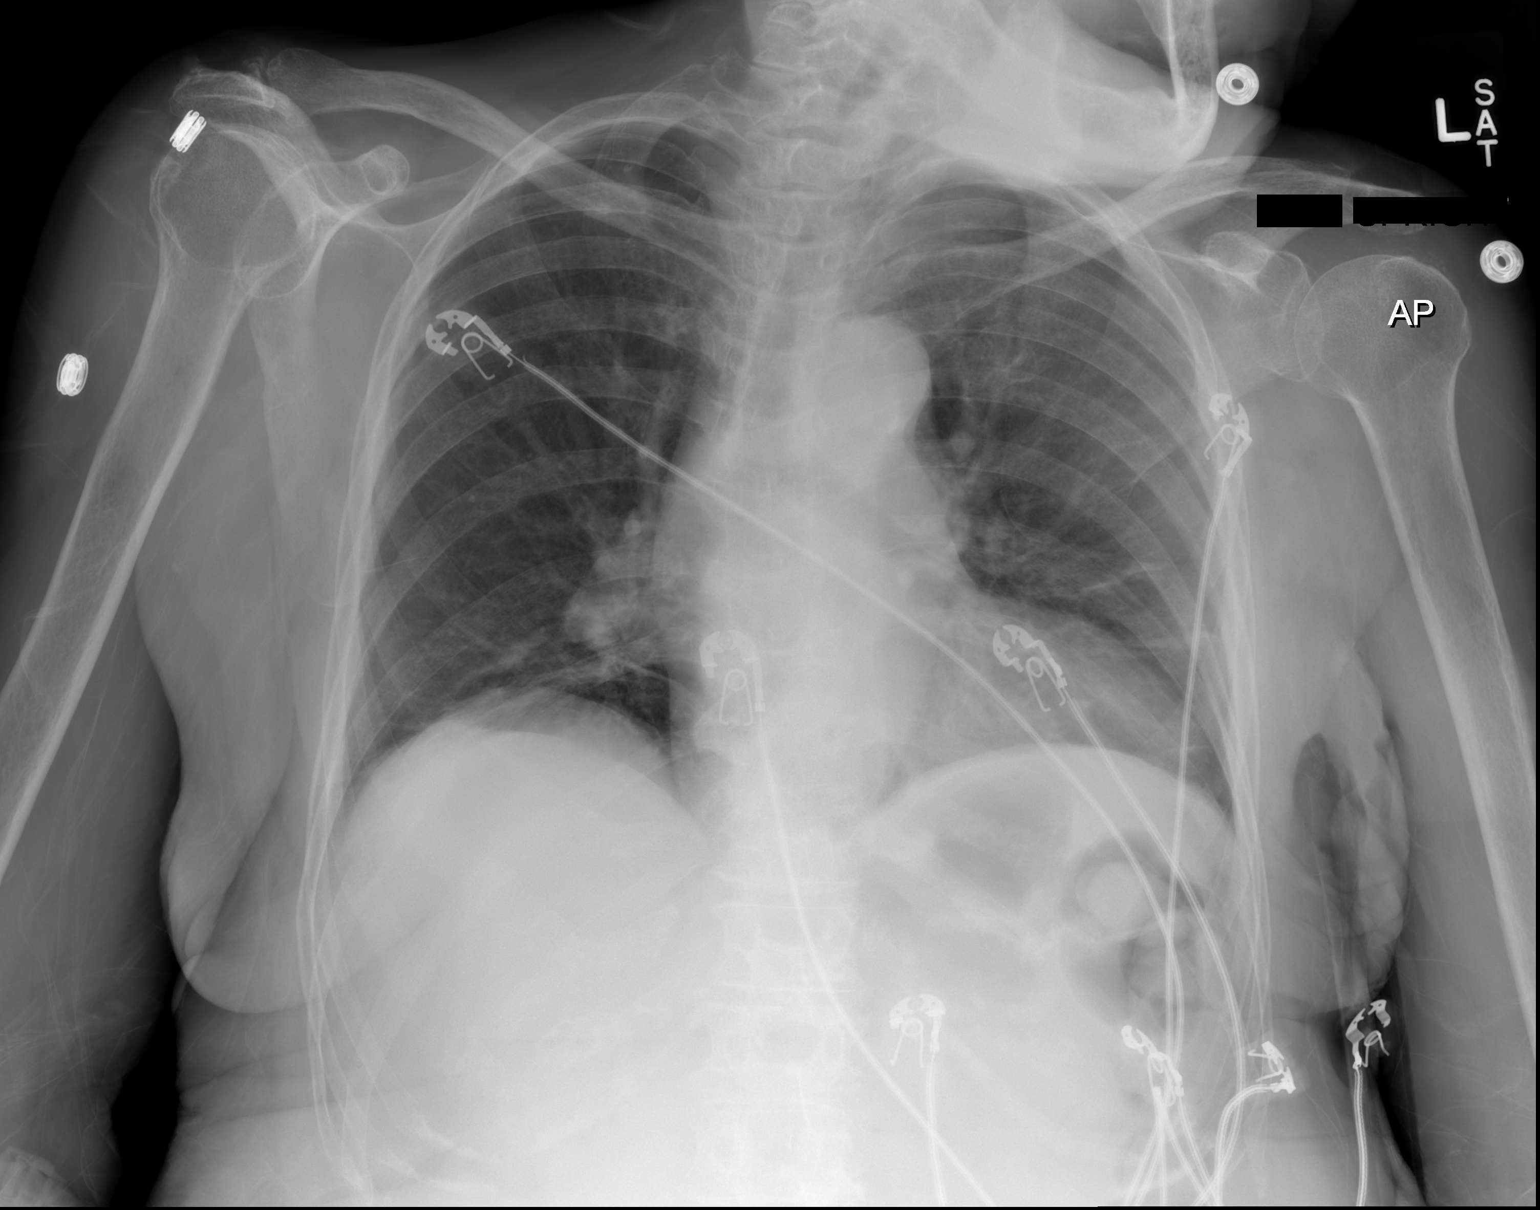

[2 of 2 positions shown; findings below may reference images not displayed]

FINDINGS: Borderline cardiomegaly, accentuated by rotation. Stable aortic
tortuosity.

Linear opacities in the bilateral lower lungs, subsegmental
atelectasis. There is a small left pleural effusion noted laterally.
No edema or pneumothorax.
IMPRESSION: Mild bibasilar atelectasis and small left pleural effusion.

## 2015-05-29 IMAGING — CR DG CHEST 1V PORT
1 series · 1 of 1 positions shown · non-contrast
Comparison: 04/06/2015

CLINICAL DATA: Weakness, sepsis.

EXAM:
PORTABLE CHEST - 1 VIEW

[AP]
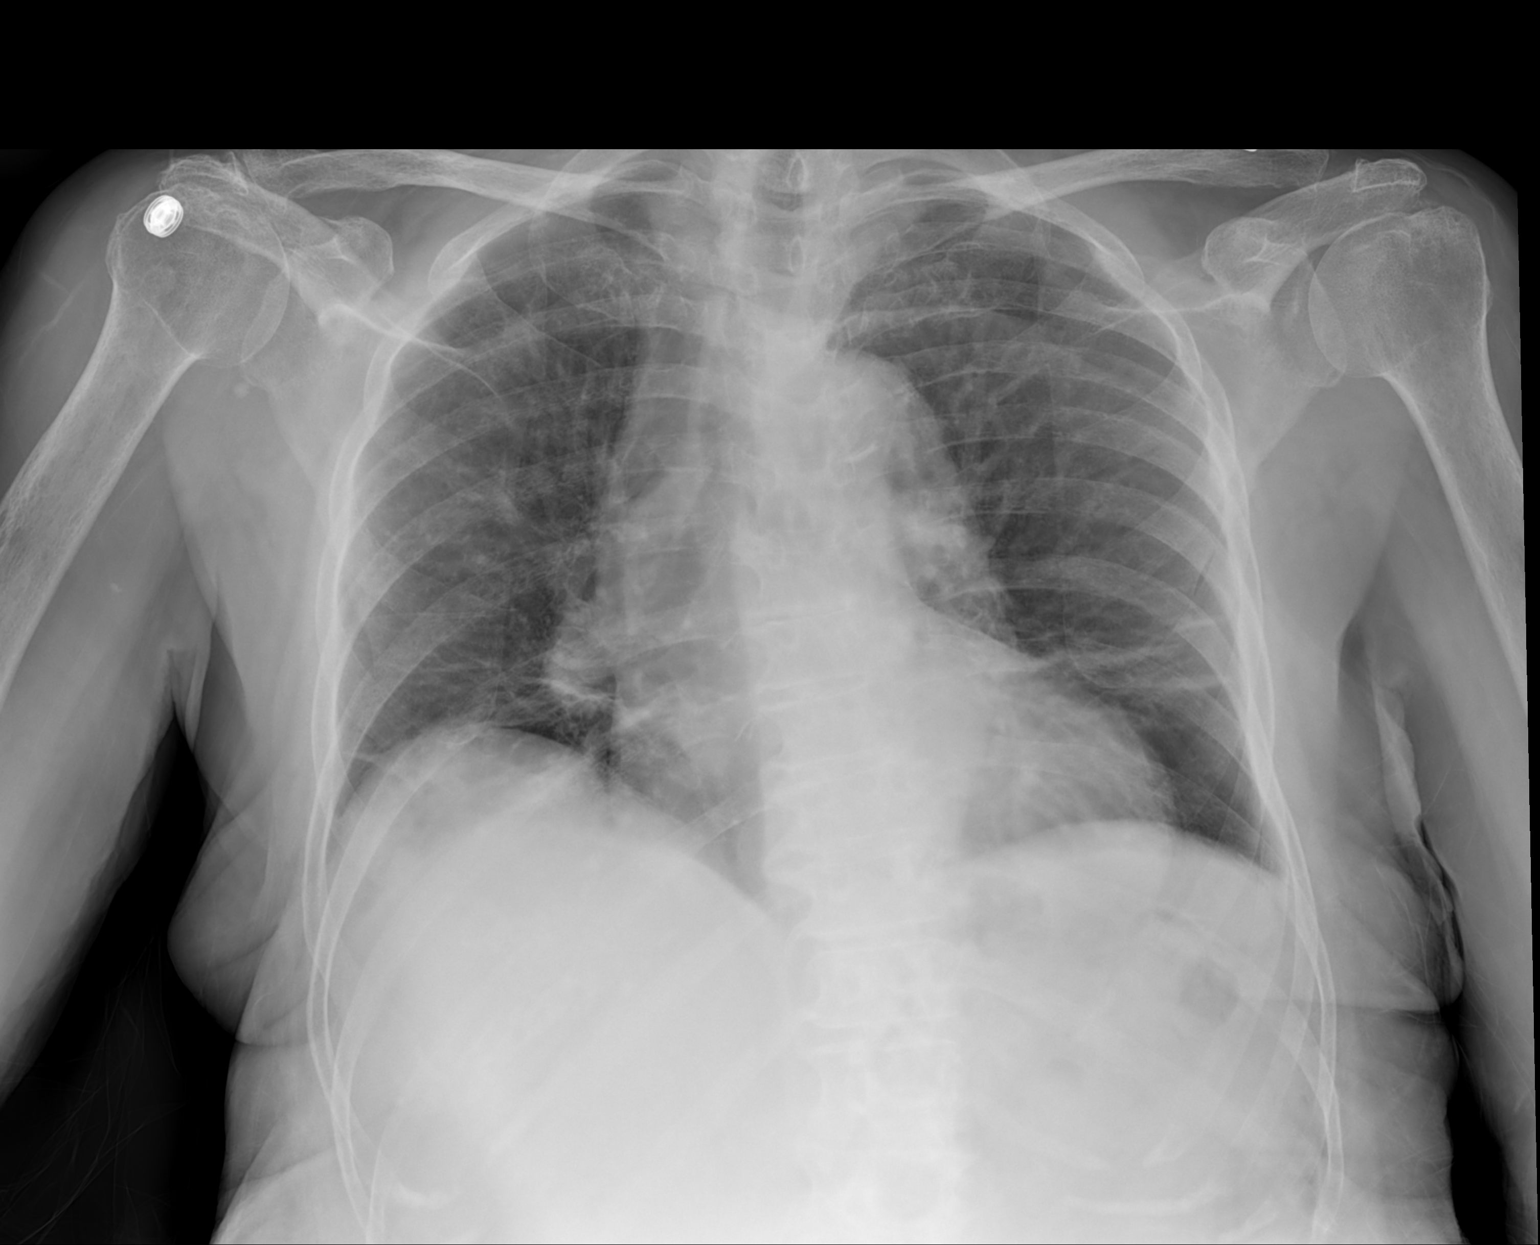

[1 of 1 positions shown; findings below may reference images not displayed]

FINDINGS: Lungs are somewhat hypoinflated with mild linear density over the
right base and left mid to lower lung without significant change
likely atelectasis. No significant effusion and no evidence of
pneumothorax. Cardiomediastinal silhouette is within normal. There
is calcified plaque over the aortic arch. Remainder of the exam is
unchanged.
IMPRESSION: Stable bibasilar linear density likely atelectasis.

## 2015-05-31 ENCOUNTER — Other Ambulatory Visit: Payer: Self-pay | Admitting: Family Medicine

## 2015-06-01 ENCOUNTER — Other Ambulatory Visit: Payer: Self-pay | Admitting: Family Medicine

## 2015-06-02 DIAGNOSIS — J45909 Unspecified asthma, uncomplicated: Secondary | ICD-10-CM | POA: Diagnosis not present

## 2015-06-02 DIAGNOSIS — I2510 Atherosclerotic heart disease of native coronary artery without angina pectoris: Secondary | ICD-10-CM | POA: Diagnosis not present

## 2015-06-02 DIAGNOSIS — I10 Essential (primary) hypertension: Secondary | ICD-10-CM | POA: Diagnosis not present

## 2015-06-02 DIAGNOSIS — N390 Urinary tract infection, site not specified: Secondary | ICD-10-CM | POA: Diagnosis not present

## 2015-06-02 DIAGNOSIS — I5022 Chronic systolic (congestive) heart failure: Secondary | ICD-10-CM | POA: Diagnosis not present

## 2015-06-02 DIAGNOSIS — E1142 Type 2 diabetes mellitus with diabetic polyneuropathy: Secondary | ICD-10-CM | POA: Diagnosis not present

## 2015-06-04 ENCOUNTER — Other Ambulatory Visit: Payer: Self-pay | Admitting: *Deleted

## 2015-06-04 DIAGNOSIS — M546 Pain in thoracic spine: Secondary | ICD-10-CM

## 2015-06-04 NOTE — Telephone Encounter (Signed)
Please call when ready for pickup. [name], [name]

## 2015-06-04 NOTE — Telephone Encounter (Signed)
Please have [name] come in for a visit for a refill on her tramadol. Each time I see her she denies pain so I'd like to evaluate where it is she is hurting.    Thanks, [name], MD [facility] Family [facility] Resident  06/04/2015, 5:14 PM

## 2015-06-07 ENCOUNTER — Ambulatory Visit: Payer: Medicare Other | Admitting: Podiatry

## 2015-06-07 ENCOUNTER — Other Ambulatory Visit: Payer: Self-pay | Admitting: *Deleted

## 2015-06-07 ENCOUNTER — Other Ambulatory Visit: Payer: Self-pay | Admitting: Family Medicine

## 2015-06-07 DIAGNOSIS — M546 Pain in thoracic spine: Secondary | ICD-10-CM

## 2015-06-07 NOTE — Telephone Encounter (Signed)
Pt called and needs a refill on her Tramadol left up front for pick up. Please call patient when ready. jw

## 2015-06-07 NOTE — Telephone Encounter (Signed)
Spoke with pt and let her know she had to make an appt in order for her to get a refill. appt made for aug 1st @ 3:30. Aibhlinn Kalmar Kennon Holter, CMA

## 2015-06-07 NOTE — Telephone Encounter (Signed)
Spoke with pt and let her know she had to make an appt in order for her to get a refill. appt made for aug 1st @ 3:30. Deseree Kennon Holter, CMA

## 2015-06-07 NOTE — Telephone Encounter (Signed)
Patient has appointment scheduled for 8/9.

## 2015-06-07 NOTE — Telephone Encounter (Signed)
Please have pt come in for an evaluation if she's continuing to have pain and requires tramadol.  Thanks, [name], MD [facility] Resident  06/07/2015, 2:06 PM

## 2015-06-08 ENCOUNTER — Ambulatory Visit (INDEPENDENT_AMBULATORY_CARE_PROVIDER_SITE_OTHER): Payer: Medicare Other | Admitting: Podiatry

## 2015-06-08 DIAGNOSIS — M79676 Pain in unspecified toe(s): Secondary | ICD-10-CM

## 2015-06-08 DIAGNOSIS — B351 Tinea unguium: Secondary | ICD-10-CM | POA: Diagnosis not present

## 2015-06-08 NOTE — Progress Notes (Signed)
Patient ID: [name], female   DOB: [birth year], 81 y.o.   MRN: [id number]  Subjective: This patient presents for a scheduled visit complaining of painful toenails and requests toenail debridement  Objective: The toenails are elongated, incurvated, hypertrophic, discolored and tender to direct palpation 6-10  Assessment: Type II diabetic Symptomatic onychomycoses 6-10  Plan: Debridement of toenails 10 without any bleeding  Reappoint 3 months

## 2015-06-08 NOTE — Patient Instructions (Signed)
Diabetes and Foot Care Diabetes may cause you to have problems because of poor blood supply (circulation) to your feet and legs. This may cause the skin on your feet to become thinner, break easier, and heal more slowly. Your skin may become dry, and the skin may peel and crack. You may also have nerve damage in your legs and feet causing decreased feeling in them. You may not notice minor injuries to your feet that could lead to infections or more serious problems. Taking care of your feet is one of the most important things you can do for yourself.  HOME CARE INSTRUCTIONS  Wear shoes at all times, even in the house. Do not go barefoot. Bare feet are easily injured.  Check your feet daily for blisters, cuts, and redness. If you cannot see the bottom of your feet, use a mirror or ask someone for help.  Wash your feet with warm water (do not use hot water) and mild soap. Then pat your feet and the areas between your toes until they are completely dry. Do not soak your feet as this can dry your skin.  Apply a moisturizing lotion or petroleum jelly (that does not contain alcohol and is unscented) to the skin on your feet and to dry, brittle toenails. Do not apply lotion between your toes.  Trim your toenails straight across. Do not dig under them or around the cuticle. File the edges of your nails with an emery board or nail file.  Do not cut corns or calluses or try to remove them with medicine.  Wear clean socks or stockings every day. Make sure they are not too tight. Do not wear knee-high stockings since they may decrease blood flow to your legs.  Wear shoes that fit properly and have enough cushioning. To break in new shoes, wear them for just a few hours a day. This prevents you from injuring your feet. Always look in your shoes before you put them on to be sure there are no objects inside.  Do not cross your legs. This may decrease the blood flow to your feet.  If you find a minor scrape,  cut, or break in the skin on your feet, keep it and the skin around it clean and dry. These areas may be cleansed with mild soap and water. Do not cleanse the area with peroxide, alcohol, or iodine.  When you remove an adhesive bandage, be sure not to damage the skin around it.  If you have a wound, look at it several times a day to make sure it is healing.  Do not use heating pads or hot water bottles. They may burn your skin. If you have lost feeling in your feet or legs, you may not know it is happening until it is too late.  Make sure your health care provider performs a complete foot exam at least annually or more often if you have foot problems. Report any cuts, sores, or bruises to your health care provider immediately. SEEK MEDICAL CARE IF:   You have an injury that is not healing.  You have cuts or breaks in the skin.  You have an ingrown nail.  You notice redness on your legs or feet.  You feel burning or tingling in your legs or feet.  You have pain or cramps in your legs and feet.  Your legs or feet are numb.  Your feet always feel cold. SEEK IMMEDIATE MEDICAL CARE IF:   There is increasing redness,   swelling, or pain in or around a wound.  There is a red line that goes up your leg.  Pus is coming from a wound.  You develop a fever or as directed by your health care provider.  You notice a bad smell coming from an ulcer or wound. Document Released: 10/27/2000 Document Revised: 07/02/2013 Document Reviewed: 04/08/2013 [employer] Patient Information 2015 [employer]. This information is not intended to replace advice given to you by your health care provider. Make sure you discuss any questions you have with your health care provider.  

## 2015-06-14 ENCOUNTER — Ambulatory Visit: Payer: Medicare Other | Admitting: Family Medicine

## 2015-06-15 ENCOUNTER — Telehealth: Payer: Self-pay | Admitting: Family Medicine

## 2015-06-15 NOTE — Telephone Encounter (Signed)
Patient has appointment scheduled with PCP for 8/9

## 2015-06-15 NOTE — Telephone Encounter (Signed)
I have received a request to fill out paper work for the patient to receive disposable underwear, etc. Unfortunately it requires a lot of information that we have never discussed and is not documented in her chart from previous providers. Please ask the patient to come in for an appt so we may fill this out together.  Thanks, [name], MD [facility] Family [facility] Resident  06/15/2015, 10:33 AM

## 2015-06-22 ENCOUNTER — Encounter: Payer: Self-pay | Admitting: Family Medicine

## 2015-06-22 ENCOUNTER — Telehealth: Payer: Self-pay | Admitting: Family Medicine

## 2015-06-22 ENCOUNTER — Ambulatory Visit (INDEPENDENT_AMBULATORY_CARE_PROVIDER_SITE_OTHER): Payer: Medicare Other | Admitting: Family Medicine

## 2015-06-22 VITALS — BP 147/57 | HR 88 | Temp 98.2°F | Ht 59.0 in | Wt 115.1 lb

## 2015-06-22 DIAGNOSIS — M546 Pain in thoracic spine: Secondary | ICD-10-CM | POA: Diagnosis not present

## 2015-06-22 DIAGNOSIS — R112 Nausea with vomiting, unspecified: Secondary | ICD-10-CM | POA: Diagnosis present

## 2015-06-22 DIAGNOSIS — E119 Type 2 diabetes mellitus without complications: Secondary | ICD-10-CM | POA: Diagnosis not present

## 2015-06-22 LAB — POCT GLYCOSYLATED HEMOGLOBIN (HGB A1C): Hemoglobin A1C: 6.5

## 2015-06-22 MED ORDER — TRAMADOL HCL 50 MG PO TABS: 50.0000 mg | ORAL_TABLET | Freq: Two times a day (BID) | ORAL | Status: DC | PRN
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
## 2015-06-22 NOTE — Telephone Encounter (Signed)
[facility] called about the forms they faxed [name] on medical necessity and medical equipment for the patient in July. They wanted to know when they would be completed. jw

## 2015-06-22 NOTE — Patient Instructions (Signed)
It was great to see you again! I have refilled your pain medication. Your blood sugar is getting better. Continue taking the metformin. I will fax over the information so you can get your underpants.

## 2015-06-22 NOTE — Telephone Encounter (Signed)
Returned call to [employer]. They wanted me to authorize and fill out paperwork for medical necessity for incontinence supplies. No history on file concerning this medical problem. Explained our office contacted the patient once I received these requests  to have her make an appt to discuss this. She voiced understanding.  [name], MD [facility] Family Medicine Resident  06/22/2015, 12:49 PM

## 2015-06-24 ENCOUNTER — Encounter: Payer: Self-pay | Admitting: Family Medicine

## 2015-06-24 NOTE — Assessment & Plan Note (Signed)
Patient endorses continued back pain on occasion. No back pain currently and no pain elicited on exam. No red flags on history or on exam. An X-ray May 2015 which revealed thoracic scoliosis with multilevel degenerative disc disease in the mid thoracic spine which is the probable etiology of her pain. Given the patient's pain has not changed in nature or worsened over the last year, will hold off on any additional imaging as the most likely diagnosis is OA/DJD. --Refilled tramadol with the discussion about balance and drowsiness. -Advised the patient to return to clinic if her symptoms worsen or change in nature. -- Patient to follow up as needed for pain

## 2015-06-24 NOTE — Assessment & Plan Note (Signed)
Alc improved today to 6.5 from 7.0.  - continue metformin 500mg  BID  - UTD on eye and foot exam - f/u in 3 months.

## 2015-06-28 ENCOUNTER — Other Ambulatory Visit: Payer: Self-pay | Admitting: *Deleted

## 2015-06-28 DIAGNOSIS — H3531 Nonexudative age-related macular degeneration: Secondary | ICD-10-CM | POA: Diagnosis not present

## 2015-06-28 DIAGNOSIS — Z961 Presence of intraocular lens: Secondary | ICD-10-CM | POA: Diagnosis not present

## 2015-06-28 MED ORDER — DICLOFENAC SODIUM 1 % TD GEL: TRANSDERMAL | Status: DC

## 2015-07-02 IMAGING — CR DG HAND COMPLETE 3+V*L*
3 series · 3 of 3 positions shown · non-contrast
Comparison: None

CLINICAL DATA: Pain and swelling for 1 week, no trauma

EXAM:
LEFT HAND - COMPLETE 3+ VIEW

[x hand pa left]
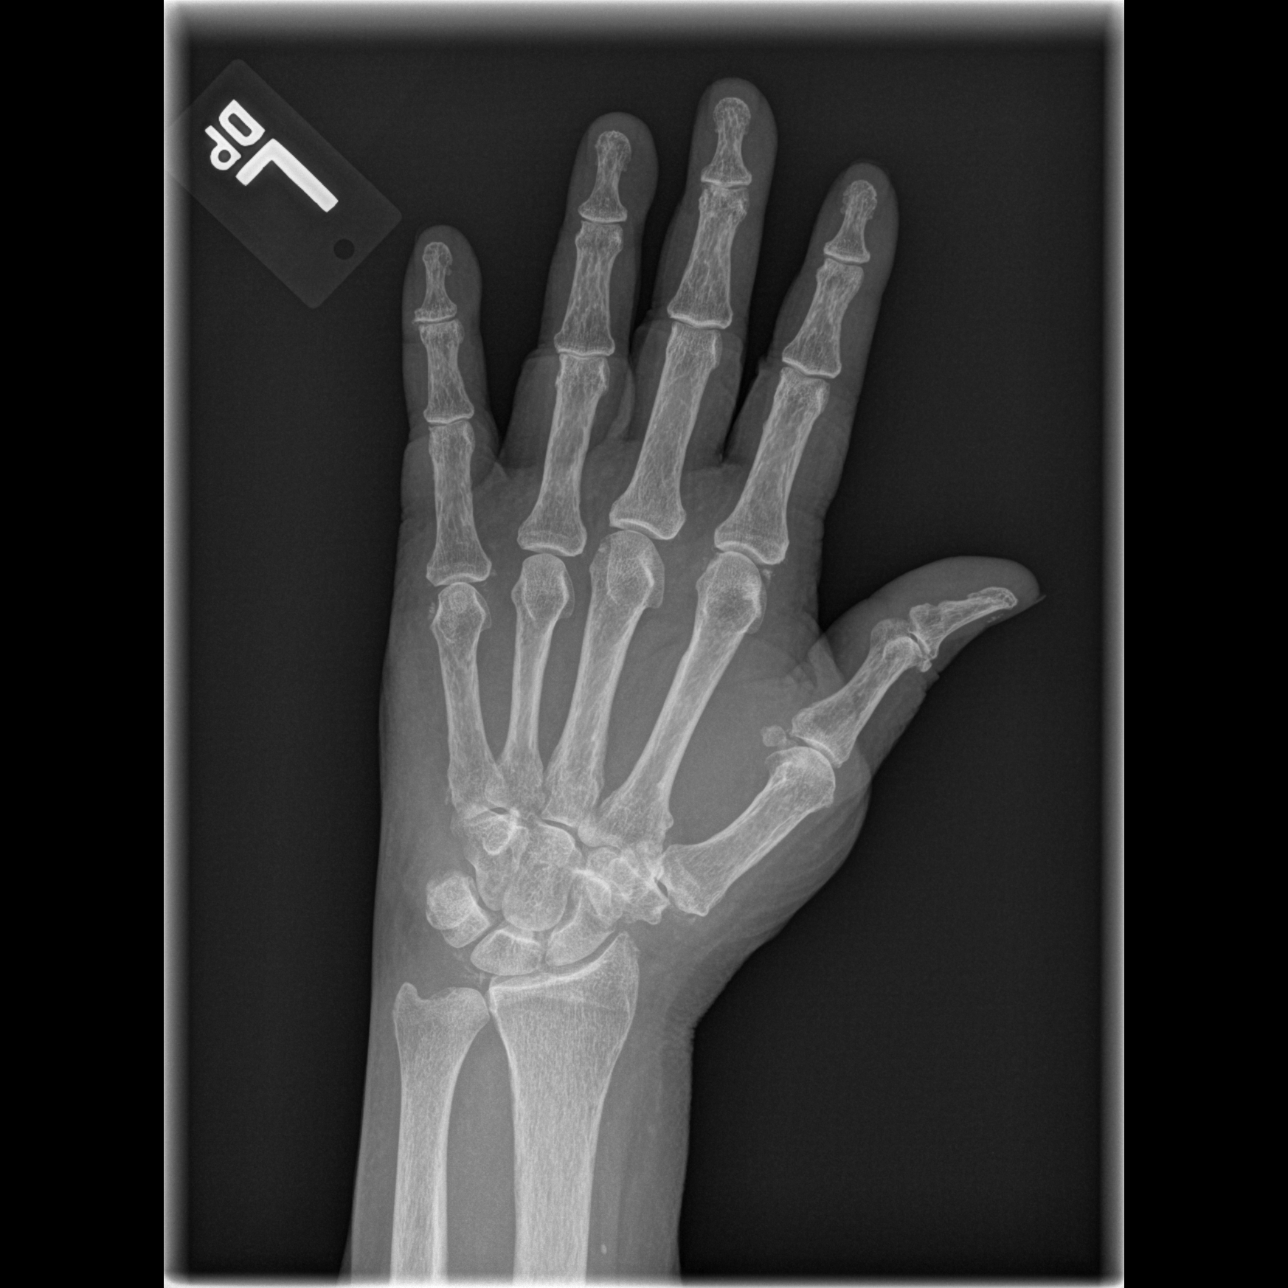

[x hand oblique left]
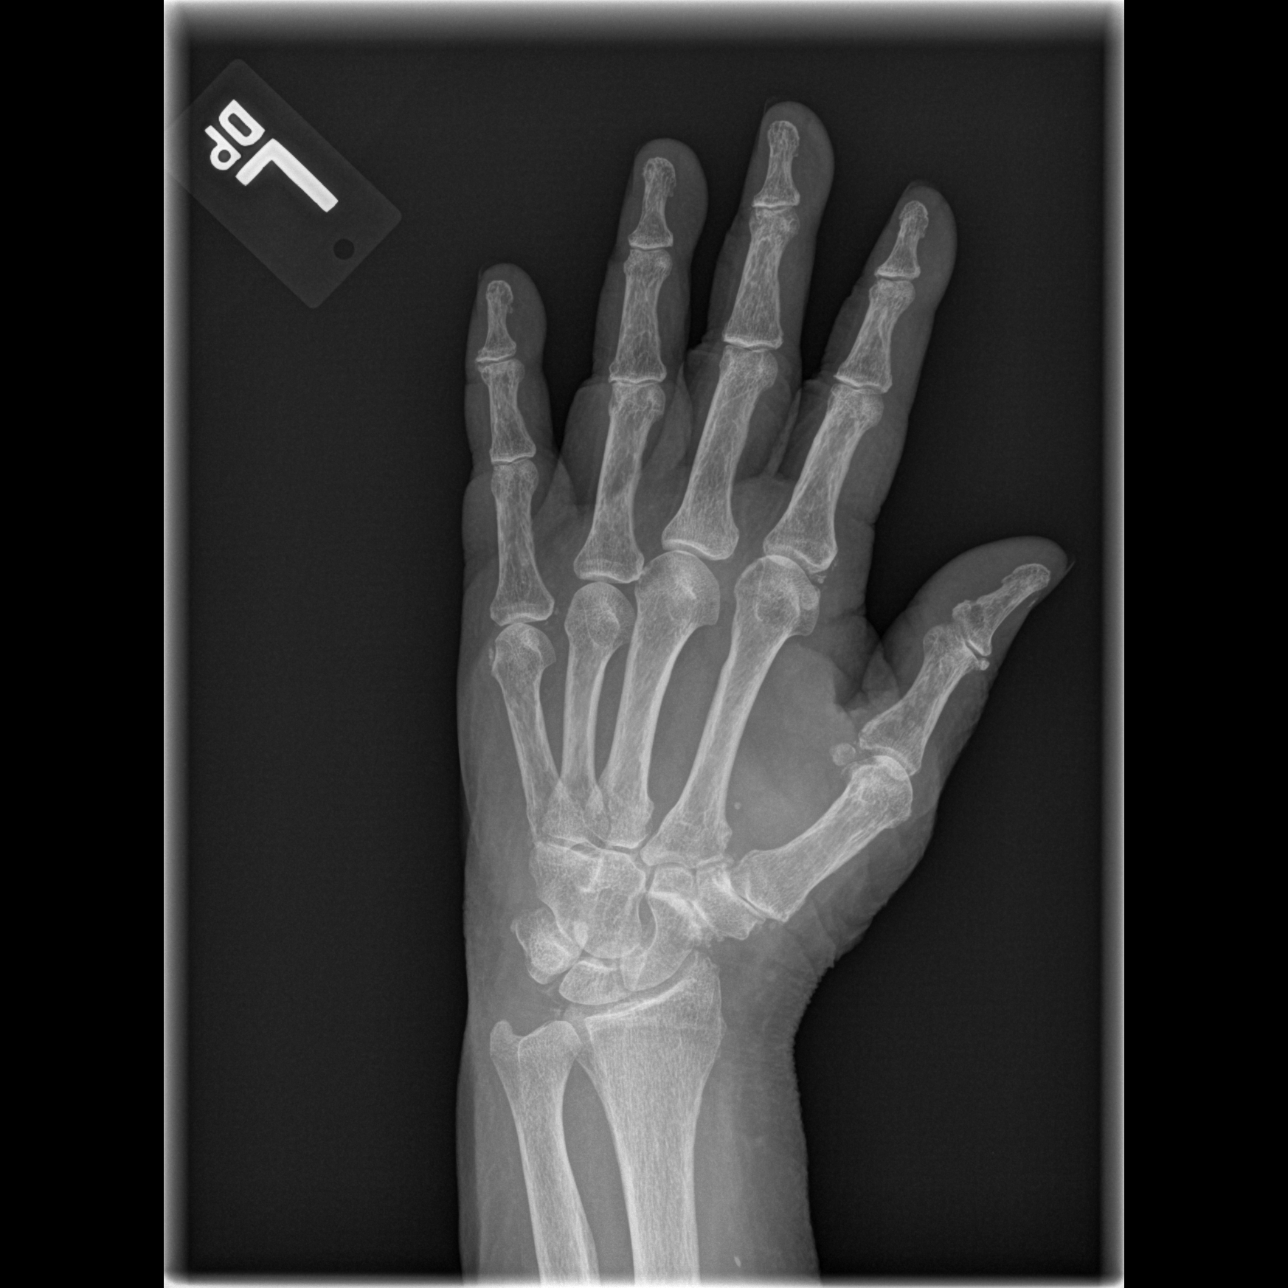

[x hand lat left]
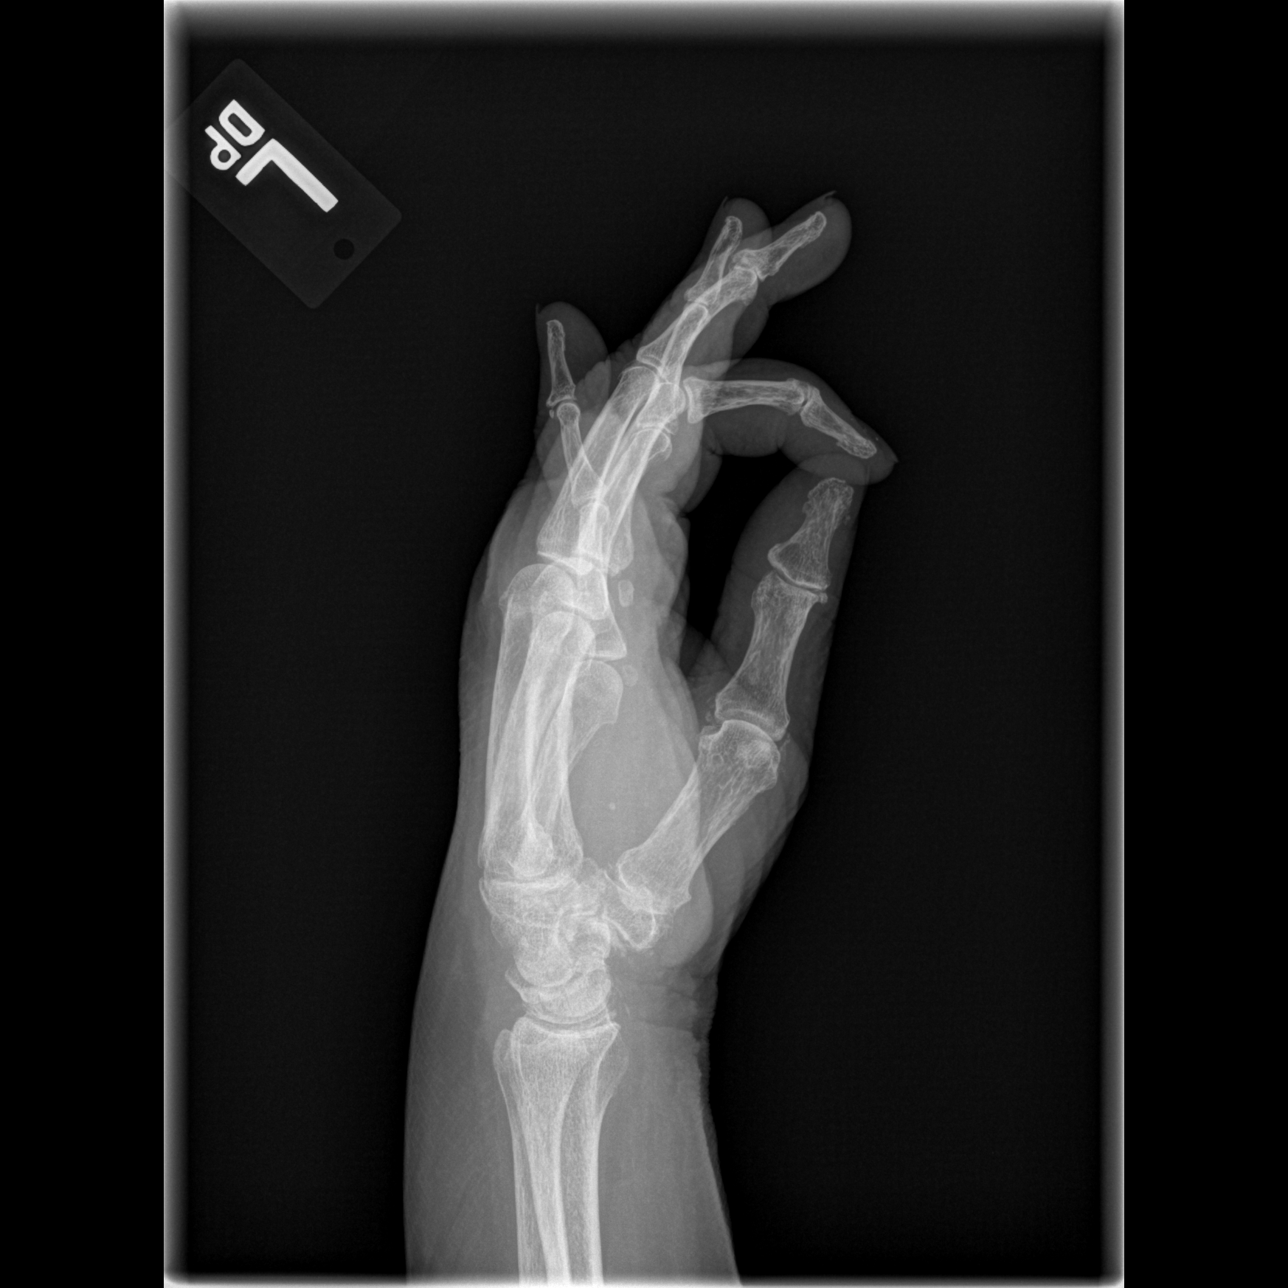

[3 of 3 positions shown; findings below may reference images not displayed]

FINDINGS: Diffuse osseous demineralization.

Minimal scattered chondrocalcinosis.

Scattered narrowing of interphalangeal joints.

Additional degenerative changes at first CMC joint and at STT joint.

No acute fracture, dislocation or bone destruction.

Dorsal soft tissue swelling.
IMPRESSION: Scattered degenerative changes and chondrocalcinosis question CPPD.

Osseous demineralization.

## 2015-07-08 ENCOUNTER — Other Ambulatory Visit: Payer: Self-pay | Admitting: Family Medicine

## 2015-07-22 ENCOUNTER — Other Ambulatory Visit: Payer: Self-pay | Admitting: Family Medicine

## 2015-07-23 NOTE — Telephone Encounter (Signed)
Voltaren gel refilled.  Thanks, [name], MD [facility] Family Medicine Resident  07/23/2015, 10:06 AM

## 2015-07-25 ENCOUNTER — Other Ambulatory Visit: Payer: Self-pay | Admitting: Family Medicine

## 2015-07-26 ENCOUNTER — Other Ambulatory Visit: Payer: Self-pay | Admitting: Family Medicine

## 2015-07-26 DIAGNOSIS — M546 Pain in thoracic spine: Secondary | ICD-10-CM

## 2015-07-26 NOTE — Telephone Encounter (Signed)
Daughter called because her mother needs a refill on her Tramadol and Carvedilol. jw

## 2015-07-27 NOTE — Telephone Encounter (Signed)
Daughter called to check the status of her request for a refill on her Mother's Tramadol. jw

## 2015-07-27 NOTE — Telephone Encounter (Signed)
Called again her med refills. Pt is out of her cardedilol

## 2015-07-28 MED ORDER — TRAMADOL HCL 50 MG PO TABS: 50.0000 mg | ORAL_TABLET | Freq: Two times a day (BID) | ORAL | Status: DC | PRN

## 2015-07-28 MED ORDER — CARVEDILOL 12.5 MG PO TABS: 12.5000 mg | ORAL_TABLET | Freq: Two times a day (BID) | ORAL | Status: DC

## 2015-07-28 MED ORDER — TRAMADOL HCL 50 MG PO TABS
50.0000 mg | ORAL_TABLET | Freq: Two times a day (BID) | ORAL | Status: DC | PRN
Start: 2015-07-28 — End: 2015-08-27

## 2015-07-28 NOTE — Telephone Encounter (Signed)
Rx refilled and at front desk. Please let the patient know that she should not be taking Tramadol daily and that if she requires another refill on this, she should come in to be evaluated.  Thanks. [name], MD [facility] Family Medicine Resident

## 2015-07-28 NOTE — Telephone Encounter (Signed)
2nd request.  Martin, Tamika L, RN  

## 2015-07-28 NOTE — Addendum Note (Signed)
Addended by: [name] on: 07/28/2015 01:56 PM   Modules accepted: Orders

## 2015-08-03 ENCOUNTER — Telehealth: Payer: Self-pay | Admitting: Family Medicine

## 2015-08-03 ENCOUNTER — Encounter: Payer: Self-pay | Admitting: Family Medicine

## 2015-08-03 ENCOUNTER — Ambulatory Visit (INDEPENDENT_AMBULATORY_CARE_PROVIDER_SITE_OTHER): Payer: Medicare Other | Admitting: Family Medicine

## 2015-08-03 VITALS — BP 175/95 | HR 90 | Temp 98.1°F | Wt 113.0 lb

## 2015-08-03 DIAGNOSIS — E162 Hypoglycemia, unspecified: Secondary | ICD-10-CM

## 2015-08-03 DIAGNOSIS — R3589 Other polyuria: Secondary | ICD-10-CM

## 2015-08-03 DIAGNOSIS — R112 Nausea with vomiting, unspecified: Secondary | ICD-10-CM | POA: Diagnosis present

## 2015-08-03 DIAGNOSIS — R358 Other polyuria: Secondary | ICD-10-CM

## 2015-08-03 LAB — POCT URINALYSIS DIPSTICK
Bilirubin, UA: NEGATIVE
GLUCOSE UA: NEGATIVE
KETONES UA: NEGATIVE
Nitrite, UA: NEGATIVE
PROTEIN UA: 30
SPEC GRAV UA: 1.02
Urobilinogen, UA: 1
pH, UA: 7.5

## 2015-08-03 LAB — POCT UA - MICROSCOPIC ONLY

## 2015-08-03 MED ORDER — CEPHALEXIN 250 MG PO CAPS: 250.0000 mg | ORAL_CAPSULE | Freq: Four times a day (QID) | ORAL | Status: DC

## 2015-08-03 NOTE — Patient Instructions (Signed)
Stop metformin For your urinary frequency - waiting on urine test to come back I will call you with results this afternoon  Be well, Dr. Ardelia Mems

## 2015-08-03 NOTE — Progress Notes (Signed)
Patient ID: [name], female   DOB: 02/22/1924, 81 y.o.   MRN: [id number]  HPI:  Pt presents for a same day appointment to discuss low blood sugar.  Saturday afternoon, she was at home and was getting off toilet. Went to stand up and fell down. Did not injure herself. Daughter was present and checked BP and blood sugar. CBG at that time was 31. They gave her orange juice and other sweets to bring sugar up. That night at 3am checked again and got 91. Next morning got 107. Only diabetes medication is metformin 500mg  twice daily. No other episodes of low blood sugar. Patient reports feeling well today. Had some aching in legs and back yesterday after cooking large family meal on Sunday, improved with tramadol. No pain today.  Patient also brings up complaint of urinary frequency. Used to take lasix which made her urinate often, but has not been on that lately. For last 1.5 weeks has had urinary frequency. No fever or dysuria. stooling normally. Urinating 3 times at night. Occasionally having urge incontinence during the daytime, which is new.   ROS: See HPI  [place]: history of hypertension, anemia, breast cancer, CHF, colon cancer, diabetes, HLD, gout, PAD, neuropathy  PHYSICAL EXAM: BP 175/95 mmHg  Pulse 90  Temp(Src) 98.1 F (36.7 C) (Oral)  Wt 113 lb (51.256 kg) Gen: NAD, pleasant, cooperative, well appearing HEENT: NCAT Heart: regular rate and rhythm no murmur Lungs: clear to auscultation bilaterally, NWOB Abdomen: soft, nontender to palpation Neuro: grossly nonfocal, speech normal  ASSESSMENT/PLAN:  1. Hypoglycemia - one isolated sugar of 31. Last A1c noted in August to be 6.5. Only diabetes medication is metformin.  -Recommendation at this time is to stop metformin altogether. -continue checking sugars regularly -f/u with PCP for next diabetes check in 2 months, sooner if needed.  2. Urinary frequency - possible UTI. UA performed, with + leuk esterace and trace blood. Urine  microscopy not available at time of clinic visit, so patient was sent home and will be called with results of urine micro this afternoon.  [place]. Ardelia Mems, [place]

## 2015-08-03 NOTE — Addendum Note (Signed)
Addended by: Leeanne Rio on: 08/03/2015 04:34 PM   Modules accepted: Orders

## 2015-08-03 NOTE — Telephone Encounter (Signed)
Attempted to reach patient to discuss urine results - looks like she has a UTI. Will send urine for culture. Recommend taking keflex 250mg  four times a day for 1 week.  Also discussed elevated BP from today's visit. Patient reports it's been mildly elevated at home. She did not complain of any pain or other symptoms during today's visit (just urinary frequency). Recommend she return later this week for BP check with nurse. Appointment scheduled for Friday morning at 10am. Patient appreciative.  Leeanne Rio, MD

## 2015-08-05 ENCOUNTER — Telehealth: Payer: Self-pay | Admitting: Family Medicine

## 2015-08-05 NOTE — Telephone Encounter (Signed)
Family Medicine Emergency Line Telephone Note  Returned page to [place] [name], Misbah's daughter, to discuss blood sugar. She checked her mother's blood sugar this evening before bed (out of habit, not any symptoms at all) and it was 241mg /dl so she gave her a single pill of metformin. This medication was discontinued at a recent office visit for hypoglycemia down to 31 and an A1c of 6.5%, so she wanted to know if that was ok to do. She specifically denies any symptoms exhibited or expressed by Ms. Jaidin to prompt the blood sugar check. I told her that this one dose of metformin is not likely to have a detrimental effect but this should not be given again because it does have side effects. I also recommended that she should not check her mother's blood sugars unless she has polyuria, polydipsia, altered mental status, nausea, vomiting, or abdominal pain. She voiced understanding and will discuss this with her doctor at their next office visit.   Haydin Calandra B. Bonner Puna, MD, PGY-3 08/05/2015 10:55 PM

## 2015-08-06 ENCOUNTER — Other Ambulatory Visit: Payer: Self-pay | Admitting: *Deleted

## 2015-08-06 ENCOUNTER — Ambulatory Visit (INDEPENDENT_AMBULATORY_CARE_PROVIDER_SITE_OTHER): Payer: Medicare Other | Admitting: *Deleted

## 2015-08-06 VITALS — BP 144/70 | HR 64

## 2015-08-06 DIAGNOSIS — R112 Nausea with vomiting, unspecified: Secondary | ICD-10-CM | POA: Diagnosis present

## 2015-08-06 DIAGNOSIS — Z23 Encounter for immunization: Secondary | ICD-10-CM | POA: Diagnosis not present

## 2015-08-06 MED ORDER — ROSUVASTATIN CALCIUM 20 MG PO TABS: 20.0000 mg | ORAL_TABLET | Freq: Every day | ORAL | Status: DC

## 2015-08-06 NOTE — Progress Notes (Signed)
   Patient in nurse clinic for blood pressure check.  BP 144/70 manually, heart rate 64.  Patient denies any symptoms today.  Flu shot given.  Derl Barrow, RN

## 2015-08-13 ENCOUNTER — Telehealth: Payer: Self-pay | Admitting: *Deleted

## 2015-08-13 NOTE — Telephone Encounter (Signed)
I called the patient. She is still checking CBGs. States they are 180s (sometimes fasting, sometimes after eating). Not having symptoms. Dicussed I'd rather her CBGs be slightly higher than too low (last A1c was 6.5).    Stressed not taking CBGs UNLESS she has polyuria, polydipsia, altered mental status, nausea, vomiting, or abdominal pain. Will recheck A1c in Nov 16 after not taking metformin x 3 months. Patient agreeable and voices understanding.  [name], MD [facility] Family Medicine Resident  08/13/2015, 1:58 PM

## 2015-08-13 NOTE — Telephone Encounter (Signed)
Patient's daughter Oberia Beaudoin called to speak with PCP regarding her mother's blood glucose level.  Patient's glucose levels are 170s-180s.  Metforminwas discontinued. Please give her a call at [phone].  Derl Barrow, RN

## 2015-08-16 ENCOUNTER — Other Ambulatory Visit: Payer: Self-pay | Admitting: Family Medicine

## 2015-08-17 ENCOUNTER — Telehealth: Payer: Self-pay | Admitting: Family Medicine

## 2015-08-17 NOTE — Telephone Encounter (Signed)
Daughter called and would like to speak to Dr. Lorenso Courier about her mother's blood sugar being high. Today it was 271 and she has not even eaten today. Please call to discuss. jw

## 2015-08-17 NOTE — Telephone Encounter (Signed)
Attempted to contact Ms. [name] at both numbers, was not present at one (a man answered) and no answer at the other.

## 2015-08-17 NOTE — Telephone Encounter (Signed)
Daughter, [name], returned call. Worried about hyperglycemia to 271. Notes her mother sometimes eats a lot of sweets. Patient is asymptomatic. We have discussed, twice now, not to take blood sugars unless the patient is symptomatic as I'd prefer to err on her being to high than too low (last A1c 6.5).  Lily voiced understanding. If patient is symptomatic and CBGs are high, advised to make an appt. Has a f/u appt with me next Friday where we can further discuss this.   [name], MD [facility] Family Medicine Resident  08/17/2015, 12:41 PM

## 2015-08-20 ENCOUNTER — Other Ambulatory Visit: Payer: Self-pay | Admitting: Family Medicine

## 2015-08-23 ENCOUNTER — Other Ambulatory Visit: Payer: Self-pay | Admitting: Family Medicine

## 2015-08-23 NOTE — Telephone Encounter (Signed)
Needs refill on crestor   [facility] [address] [address]

## 2015-08-25 ENCOUNTER — Other Ambulatory Visit: Payer: Self-pay | Admitting: Family Medicine

## 2015-08-25 MED ORDER — ROSUVASTATIN CALCIUM 20 MG PO TABS: 20.0000 mg | ORAL_TABLET | Freq: Every day | ORAL | Status: DC

## 2015-08-26 ENCOUNTER — Telehealth: Payer: Self-pay | Admitting: Family Medicine

## 2015-08-26 NOTE — Telephone Encounter (Signed)
Chelle from [facility] is calling on behalf of pt. She states that she faxed an authorization form for a refill for Alcohol Swabs and pads on dates 08/12/2015 and 08/23/2015. She states that they have not received any correspondence as of yet and would like to check on the status of this request. She states that she will be faxing another form today. Sadie Reynolds, ASA

## 2015-08-26 NOTE — Telephone Encounter (Signed)
Returned call. Patient was advised to no longer check her blood sugars as we've discontinued all medication. No need for her to have alcohol prep pads. I stated this may change in the future if we find that after 3 months without medication her A1c is significantly elevated and we need to add back the metformin.  [name], MD [facility] Family Medicine Resident  08/26/2015, 12:57 PM

## 2015-08-27 ENCOUNTER — Encounter: Payer: Self-pay | Admitting: Family Medicine

## 2015-08-27 ENCOUNTER — Ambulatory Visit (INDEPENDENT_AMBULATORY_CARE_PROVIDER_SITE_OTHER): Payer: Medicare Other | Admitting: Family Medicine

## 2015-08-27 VITALS — BP 140/60 | HR 71 | Temp 97.9°F | Ht 59.0 in | Wt 115.4 lb

## 2015-08-27 DIAGNOSIS — M546 Pain in thoracic spine: Secondary | ICD-10-CM | POA: Diagnosis not present

## 2015-08-27 DIAGNOSIS — I10 Essential (primary) hypertension: Secondary | ICD-10-CM

## 2015-08-27 DIAGNOSIS — Z23 Encounter for immunization: Secondary | ICD-10-CM

## 2015-08-27 DIAGNOSIS — M150 Primary generalized (osteo)arthritis: Secondary | ICD-10-CM | POA: Diagnosis not present

## 2015-08-27 DIAGNOSIS — M159 Polyosteoarthritis, unspecified: Secondary | ICD-10-CM

## 2015-08-27 DIAGNOSIS — R112 Nausea with vomiting, unspecified: Secondary | ICD-10-CM | POA: Diagnosis present

## 2015-08-27 MED ORDER — TRAMADOL HCL 50 MG PO TABS: 50.0000 mg | ORAL_TABLET | Freq: Three times a day (TID) | ORAL | Status: DC | PRN

## 2015-08-27 MED ORDER — TRAMADOL HCL 50 MG PO TABS: 50.0000 mg | ORAL_TABLET | Freq: Two times a day (BID) | ORAL | Status: DC | PRN

## 2015-08-27 MED ORDER — BLOOD PRESSURE KIT: PACK | Status: DC

## 2015-08-27 NOTE — Patient Instructions (Signed)
Its good to see you again. I have refilled you pain medication  If you need, you can use the Voltaren gel on your hip.  Do NOT check you blood sugar unless you're having symptoms: dizziness, lightheadeness, increased urination, or shakiness. We will recheck your A1c at your follow up next month.

## 2015-08-27 NOTE — Progress Notes (Signed)
Patient ID: [name], female   DOB: 21-Aug-1924, 81 y.o.   MRN: [id number]    Subjective: CC: f/u HTN HPI: Patient is a 81 y.o. female with a past medical history of HTN, DM, breast cancer, OA, among other things  presenting to clinic today for a f/u on HTN   Hypertension Blood pressure at home: doesn't take BPs at home.  Blood pressure today: 140/60 Meds: Compliant with Norvasc 5 mg daily, Coreg 12.5 mg daily, Cozaar 50 mg daily.  Side effects: None ROS: Denies headache, dizziness, visual changes, nausea, vomiting, chest pain, abdominal pain or shortness of breath.  Right hip pain: Has always been bothersome but feels it is getting worse since it has been cold. Pain with abduction of the hip. No radiation of pain.  No falls. Tramadol helps significantly.   DM: Stopped metformin 2 months ago after CBG was in the 30s. Her daughter intermittently takes her blood sugar out of habit. No symptoms of hyperglycemia or hypoglycemia.  Social History: continues to do snuff.   ROS: All other systems reviewed and are negative.  Past Medical History Patient Active Problem List   Diagnosis Date Noted  . Gout 05/14/2015  . UTI (urinary tract infection) 04/15/2015  . Acute delirium 04/15/2015  . Fever 04/07/2015  . Hypertension 04/07/2015  . Dyslipidemia   . Essential hypertension   . Seasonal allergies 02/10/2015  . Nausea with vomiting 02/10/2015  . Flank pain 02/10/2015  . Stroke-like symptoms   . Stroke-like episode ([place]) 01/25/2015  . Syncope and collapse 01/25/2015  . Right leg pain   . Accelerated hypertension   . Hypertensive emergency 09/19/2014  . Dizziness 09/19/2014  . Anemia of chronic illness 08/19/2014  . Acute superficial venous thrombosis of right lower extremity 08/19/2014  . Right ankle pain 08/10/2014  . Hypercalcemia 06/09/2014  . Breast cancer, left breast ([place]) 06/09/2014  . Breast cancer, right breast ([place]) 06/09/2014  . Edema of left lower extremity  03/30/2014  . PAD (peripheral artery disease) ([place]) 03/30/2014  . Colon cancer ([place]) 12/11/2013  . Neck pain 10/22/2013  . History of fall 05/15/2013  . Right thigh pain 05/15/2013  . Memory deficit 05/15/2013  . Bilateral leg pain 03/07/2013  . Trigger thumb of right hand 03/07/2013  . Lower extremity edema 02/19/2013  . Weakness generalized 01/22/2013  . Stricture and stenosis of esophagus 10/27/2012  . Poor social situation 09/13/2012  . Low back pain 08/07/2012  . Musculoskeletal chest pain 07/10/2012  . Thoracic back pain 05/24/2012  . Weight loss 04/03/2012  . Insomnia 09/25/2011  . Tobacco abuse 06/02/2011  . History of breast cancer in female, bilateral.  Mastectomies 10/24/2010. 04/26/2011  . Hiatal hernia 03/22/2011  . Colon polyp 03/22/2011  . Poor circulation 03/22/2011  . 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 NEUROPATHY 08/25/2009  . ANEMIA-IRON DEFICIENCY 04/29/2007  . HLD (hyperlipidemia) 01/29/2007  . Diabetes type 2, controlled ([place]) 01/10/2007  . HYPERTENSION, BENIGN SYSTEMIC 01/10/2007  . Coronary atherosclerosis 01/10/2007  . Chronic systolic heart failure ([place]) 01/10/2007  . Asthma 01/10/2007  . Reflux esophagitis 01/10/2007  . Osteoarthritis, multiple sites 01/10/2007    Medications- reviewed and updated Current Outpatient Prescriptions  Medication Sig Dispense Refill  . acetaminophen (TYLENOL) 500 MG tablet Take 500 mg by mouth every 6 (six) hours as needed for moderate pain (pain).    Marland Kitchen albuterol (PROVENTIL HFA;VENTOLIN HFA) 108 (90 BASE) MCG/ACT inhaler Inhale 2 puffs into the lungs every 6 (six) hours as needed. For  shortness of breath 1 Inhaler 6  . amLODipine (NORVASC) 5 MG tablet TAKE 1 TABLET (5 MG TOTAL) BY MOUTH DAILY. 90 tablet 0  . aspirin (ASPIRIN CHILDRENS) 81 MG chewable tablet Chew 81 mg by mouth daily.      . Blood Pressure KIT Use the blood pressure kit to check you blood pressure twice a day 1 each 0  . carvedilol (COREG)  12.5 MG tablet Take 1 tablet (12.5 mg total) by mouth 2 (two) times daily with a meal. 60 tablet 3  . clopidogrel (PLAVIX) 75 MG tablet TAKE 1 TABLET (75 MG TOTAL) BY MOUTH DAILY. 30 tablet 2  . colchicine 0.6 MG tablet Take 2 tabs by mouth for gout, then 1 tab in one hour. Do not take more for 3 days. 9 tablet 0  . diclofenac sodium (VOLTAREN) 1 % GEL APPLY 2 GRAMS TOPICALLY 4 TIMES DAILY AS NEEDED FOR PAIN 100 g 0  . gabapentin (NEURONTIN) 300 MG capsule TAKE ONE CAPSULE BY MOUTH TWICE A DAY 60 capsule 1  . glucose blood test strip Use as instructed Dx. Code 250.02 100 each 12  . losartan (COZAAR) 50 MG tablet TAKE 1 TABLET (50 MG TOTAL) BY MOUTH AT BEDTIME. 30 tablet 0  . magnesium hydroxide (MILK OF MAGNESIA) 400 MG/5ML suspension Take 30 mLs by mouth daily as needed for mild constipation.    . ranitidine (ZANTAC) 150 MG capsule TAKE ONE CAPSULE BY MOUTH TWICE A DAY AS NEEDED FOR HEARTBURN 60 capsule 3  . rosuvastatin (CRESTOR) 20 MG tablet Take 1 tablet (20 mg total) by mouth at bedtime. 30 tablet 2  . tamoxifen (NOLVADEX) 20 MG tablet TAKE 1 TABLET (20 MG TOTAL) BY MOUTH DAILY. 30 tablet 3  . traMADol (ULTRAM) 50 MG tablet Take 1 tablet (50 mg total) by mouth every 8 (eight) hours as needed for severe pain. 75 tablet 0  . VOLTAREN 1 % GEL APPLY 2 GRAMS TOPICALLY 4 (FOUR) TIMES DAILY AS NEEDED FOR PAIN 100 g 0  . [DISCONTINUED] cetirizine (ZYRTEC) 5 MG tablet Take 1 tablet (5 mg total) by mouth daily. Take at night time as it can cause some drowsiness 30 tablet 4   No current facility-administered medications for this visit.    Objective: Office vital signs reviewed. BP 140/60 mmHg  Pulse 71  Temp(Src) 97.9 F (36.6 C) (Oral)  Ht $R'4\' 11"'DH$  (1.499 m)  Wt 115 lb 6.4 oz (52.345 kg)  BMI 23.30 kg/m2   Physical Examination:  General: Awake, alert. Pleasant and in NAD. Daughter comes in at the end of the visit.  Cardio: RRR, II/VI systolic murmur. No rubs or gallops. No pitting  edema. Pulm: No increased WOB. CTAB, without wheezes, rhonchi or crackles noted.  MSK: Normal bulk and tone. No swelling, ecchymoses or erythema noted over the R hip. Some mild tenderness to palpation diffusely around the R hip. Pain with abduction of the R and L hips, but more prominent on the R.  Skin: dry, intact, no rashes or lesions  Assessment/Plan: HYPERTENSION, BENIGN SYSTEMIC BPs are at goal. No side effects noted. - continue norvasc, coreg BID, losartan, and PRN Lasix (which she has not needed recently).  - pt to f/u in 1 month (as we need to check an A1c).   Osteoarthritis, multiple sites Discussed that oftentimes, NSAIDs are great for OA pain, but given her cardiac history I'd prefer to stay away from these. No falls. - Rx for tramadol  - Voltaren gel - RTC  precautions discussed.      Orders Placed This Encounter  Procedures  . Pneumococcal conjugate vaccine 13-valent    Meds ordered this encounter  Medications  . DISCONTD: Blood Pressure KIT    Sig: Use the blood pressure kit to check you blood pressure twice a day    Dispense:  1 each    Refill:  0    Diagnosis code: I10  . DISCONTD: traMADol (ULTRAM) 50 MG tablet    Sig: Take 1 tablet (50 mg total) by mouth every 12 (twelve) hours as needed for severe pain.    Dispense:  60 tablet    Refill:  0  . Blood Pressure KIT    Sig: Use the blood pressure kit to check you blood pressure twice a day    Dispense:  1 each    Refill:  0    Diagnosis code: I10  . traMADol (ULTRAM) 50 MG tablet    Sig: Take 1 tablet (50 mg total) by mouth every 8 (eight) hours as needed for severe pain.    Dispense:  75 tablet    Refill:  [place] PGY-2, [place]

## 2015-08-30 ENCOUNTER — Encounter: Payer: Self-pay | Admitting: Family Medicine

## 2015-08-30 ENCOUNTER — Other Ambulatory Visit: Payer: Self-pay | Admitting: Family Medicine

## 2015-08-30 NOTE — Assessment & Plan Note (Signed)
Discussed that oftentimes, NSAIDs are great for OA pain, but given her cardiac history I'd prefer to stay away from these. No falls. - Rx for tramadol  - Voltaren gel - RTC precautions discussed.

## 2015-08-30 NOTE — Assessment & Plan Note (Signed)
BPs are at goal. No side effects noted. - continue norvasc, coreg BID, losartan, and PRN Lasix (which she has not needed recently).  - pt to f/u in 1 month (as we need to check an A1c).

## 2015-09-05 ENCOUNTER — Telehealth: Payer: Self-pay | Admitting: Family Medicine

## 2015-09-05 NOTE — Telephone Encounter (Signed)
Emergency line  Call from Soundra Pilon, the patient's daughter. The patient almost fell this afternoon around 2pm after getting dizzy. She was able to rest on the bed.  Around 8:30pm, she was with her other daughter who called EMS with the patient because dizzy again. When EMS arrived they checked her blood sugar and it was found to be 417. They rechecked it just now and it was 357. The patient last ate around 7:30pm. She has been drinking more sweet tea recently. No polyuria or polydipsia. Metformin was previous d/c'd due to hypoglycemia down to 33 per report of her daughter.    Will start back metformin 500mg  daily.  Check CBGs qAM.  Keep follow up appt with me. IF dizziness occurs again, make same day appt.  [name], MD [facility] Family Medicine Resident  09/05/2015, 10:48 PM

## 2015-09-07 ENCOUNTER — Ambulatory Visit (INDEPENDENT_AMBULATORY_CARE_PROVIDER_SITE_OTHER): Payer: Medicare Other | Admitting: Podiatry

## 2015-09-07 ENCOUNTER — Ambulatory Visit: Payer: Medicare Other | Admitting: Family Medicine

## 2015-09-07 ENCOUNTER — Encounter: Payer: Self-pay | Admitting: Podiatry

## 2015-09-07 DIAGNOSIS — M79676 Pain in unspecified toe(s): Secondary | ICD-10-CM

## 2015-09-07 DIAGNOSIS — B351 Tinea unguium: Secondary | ICD-10-CM

## 2015-09-07 NOTE — Progress Notes (Signed)
Patient ID: [name], female   DOB: [birth year], 81 y.o.   MRN: [id number]   Subjective: This patient presents for scheduled visit complaining of painful toenails walking wearing shoes and requests toenail debridement  Objective: Orientated 3 No open skin lesions bilaterally The toenails are hypertrophic, elongated, discolored, incurvated and tender direct palpation 6-10  Assessment: Symptomatic onychomycoses 6-10 Type II diabetic  Plan: Debridement toenails 10 and mechanically and electrically without any bleeding  Reappoint 3 months

## 2015-09-07 NOTE — Patient Instructions (Signed)
Diabetes and Foot Care Diabetes may cause you to have problems because of poor blood supply (circulation) to your feet and legs. This may cause the skin on your feet to become thinner, break easier, and heal more slowly. Your skin may become dry, and the skin may peel and crack. You may also have nerve damage in your legs and feet causing decreased feeling in them. You may not notice minor injuries to your feet that could lead to infections or more serious problems. Taking care of your feet is one of the most important things you can do for yourself.  HOME CARE INSTRUCTIONS  Wear shoes at all times, even in the house. Do not go barefoot. Bare feet are easily injured.  Check your feet daily for blisters, cuts, and redness. If you cannot see the bottom of your feet, use a mirror or ask someone for help.  Wash your feet with warm water (do not use hot water) and mild soap. Then pat your feet and the areas between your toes until they are completely dry. Do not soak your feet as this can dry your skin.  Apply a moisturizing lotion or petroleum jelly (that does not contain alcohol and is unscented) to the skin on your feet and to dry, brittle toenails. Do not apply lotion between your toes.  Trim your toenails straight across. Do not dig under them or around the cuticle. File the edges of your nails with an emery board or nail file.  Do not cut corns or calluses or try to remove them with medicine.  Wear clean socks or stockings every day. Make sure they are not too tight. Do not wear knee-high stockings since they may decrease blood flow to your legs.  Wear shoes that fit properly and have enough cushioning. To break in new shoes, wear them for just a few hours a day. This prevents you from injuring your feet. Always look in your shoes before you put them on to be sure there are no objects inside.  Do not cross your legs. This may decrease the blood flow to your feet.  If you find a minor scrape,  cut, or break in the skin on your feet, keep it and the skin around it clean and dry. These areas may be cleansed with mild soap and water. Do not cleanse the area with peroxide, alcohol, or iodine.  When you remove an adhesive bandage, be sure not to damage the skin around it.  If you have a wound, look at it several times a day to make sure it is healing.  Do not use heating pads or hot water bottles. They may burn your skin. If you have lost feeling in your feet or legs, you may not know it is happening until it is too late.  Make sure your health care provider performs a complete foot exam at least annually or more often if you have foot problems. Report any cuts, sores, or bruises to your health care provider immediately. SEEK MEDICAL CARE IF:   You have an injury that is not healing.  You have cuts or breaks in the skin.  You have an ingrown nail.  You notice redness on your legs or feet.  You feel burning or tingling in your legs or feet.  You have pain or cramps in your legs and feet.  Your legs or feet are numb.  Your feet always feel cold. SEEK IMMEDIATE MEDICAL CARE IF:   There is increasing redness,   swelling, or pain in or around a wound.  There is a red line that goes up your leg.  Pus is coming from a wound.  You develop a fever or as directed by your health care provider.  You notice a bad smell coming from an ulcer or wound.   This information is not intended to replace advice given to you by your health care provider. Make sure you discuss any questions you have with your health care provider.   Document Released: 10/27/2000 Document Revised: 07/02/2013 Document Reviewed: 04/08/2013 Elsevier Interactive Patient Education 2016 Elsevier Inc.  

## 2015-09-08 ENCOUNTER — Ambulatory Visit: Payer: Medicare Other | Admitting: Family Medicine

## 2015-09-08 ENCOUNTER — Ambulatory Visit
Admission: RE | Admit: 2015-09-08 | Discharge: 2015-09-08 | Disposition: A | Discharge status: Home / Self Care | Payer: Medicare Other | Source: Ambulatory Visit | Attending: Family Medicine | Admitting: Family Medicine

## 2015-09-08 ENCOUNTER — Telehealth: Payer: Self-pay | Admitting: Family Medicine

## 2015-09-08 ENCOUNTER — Ambulatory Visit (INDEPENDENT_AMBULATORY_CARE_PROVIDER_SITE_OTHER): Payer: Medicare Other | Admitting: Family Medicine

## 2015-09-08 VITALS — BP 130/80 | HR 85 | Temp 99.3°F | Wt 114.7 lb

## 2015-09-08 DIAGNOSIS — M791 Myalgia, unspecified site: Secondary | ICD-10-CM

## 2015-09-08 DIAGNOSIS — R05 Cough: Secondary | ICD-10-CM

## 2015-09-08 DIAGNOSIS — R0989 Other specified symptoms and signs involving the circulatory and respiratory systems: Secondary | ICD-10-CM

## 2015-09-08 DIAGNOSIS — R059 Cough, unspecified: Secondary | ICD-10-CM

## 2015-09-08 DIAGNOSIS — R112 Nausea with vomiting, unspecified: Secondary | ICD-10-CM | POA: Diagnosis present

## 2015-09-08 MED ORDER — DEXTROMETHORPHAN HBR 15 MG PO CAPS: 30.0000 mg | ORAL_CAPSULE | Freq: Four times a day (QID) | ORAL | Status: DC | PRN

## 2015-09-08 MED ORDER — OSELTAMIVIR PHOSPHATE 30 MG PO CAPS: 30.0000 mg | ORAL_CAPSULE | Freq: Two times a day (BID) | ORAL | Status: DC

## 2015-09-08 NOTE — Telephone Encounter (Signed)
Calling to let pcp know pt is using [facility]/[address]

## 2015-09-08 NOTE — Patient Instructions (Signed)
I want you to get a chest x-ray to evaluate for pneumonia.  I will call you if it is positive, then we need to start antibiotics.  If you have not heard from me by 5:00 call the clinic.    take Tamiflu 30 mg twice a day for the next 5 days   Take cough medicine every 8 hours as needed   Call or return to clinic if you develop chest pain, difficulty breathing,  Or confusion

## 2015-09-08 NOTE — Assessment & Plan Note (Signed)
Symptoms concerning for PNA vs Flu vs URI/bronchitis. Given age, frailty and chest pian will evaluate further with CXR, CBC and CMET - treat empirically w/ tamiflu  - Called and discussed negative CXR with patient; no abx  - Advised return in develops SOB, worsening CP, or confusion

## 2015-09-08 NOTE — Progress Notes (Signed)
   Subjective:    Patient ID: [name], female    DOB: 10-03-1924, 81 y.o.   MRN: [id number]  Seen for Same day visit for   CC: chest congestion  She reports nasal congestion, cough, fevers, chills, myalgia and right-sided chest pain since yesterday. Denies trouble breathing, confusion. Some nausea but no vomiting or diarrhea. Eating less, but continues drinking and voiding normally. Has received flu this year and pneumonia vaccines.   Review of Systems   See HPI for ROS. Objective:  BP 130/80 mmHg  Pulse 85  Temp(Src) 99.3 F (37.4 C) (Oral)  Wt 114 lb 11.2 oz (52.028 kg)  SpO2 99%  General: NAD;  Cardiac: RRR, normal heart sounds, no murmurs Respiratory: CTAB, normal effort; no crackles or rales Abdomen: soft, nontender, nondistended, Extremities: no edema; WWP. Skin: warm and dry, no rashes noted Neuro: alert and oriented, no focal deficits    Assessment & Plan:   Chest congestion Symptoms concerning for PNA vs Flu vs URI/bronchitis. Given age, frailty and chest pian will evaluate further with CXR, CBC and CMET - treat empirically w/ tamiflu  - Called and discussed negative CXR with patient; no abx  - Advised return in develops SOB, worsening CP, or confusion

## 2015-09-09 LAB — COMPREHENSIVE METABOLIC PANEL
ALT: 5 U/L — ABNORMAL LOW (ref 6–29)
AST: 10 U/L (ref 10–35)
Albumin: 3.4 g/dL — ABNORMAL LOW (ref 3.6–5.1)
Alkaline Phosphatase: 41 U/L (ref 33–130)
BUN: 12 mg/dL (ref 7–25)
CHLORIDE: 105 mmol/L (ref 98–110)
CO2: 23 mmol/L (ref 20–31)
CREATININE: 1.02 mg/dL — AB (ref 0.60–0.88)
Calcium: 10.1 mg/dL (ref 8.6–10.4)
GLUCOSE: 199 mg/dL — AB (ref 65–99)
Potassium: 3.7 mmol/L (ref 3.5–5.3)
SODIUM: 137 mmol/L (ref 135–146)
Total Bilirubin: 0.3 mg/dL (ref 0.2–1.2)
Total Protein: 6.8 g/dL (ref 6.1–8.1)

## 2015-09-09 LAB — CBC WITH DIFFERENTIAL/PLATELET
BASOS ABS: 0 10*3/uL (ref 0.0–0.1)
Basophils Relative: 0 % (ref 0–1)
EOS PCT: 2 % (ref 0–5)
Eosinophils Absolute: 0.1 10*3/uL (ref 0.0–0.7)
HEMATOCRIT: 33 % — AB (ref 36.0–46.0)
Hemoglobin: 9.8 g/dL — ABNORMAL LOW (ref 12.0–15.0)
LYMPHS ABS: 1.3 10*3/uL (ref 0.7–4.0)
LYMPHS PCT: 24 % (ref 12–46)
MCH: 22.1 pg — ABNORMAL LOW (ref 26.0–34.0)
MCHC: 29.7 g/dL — ABNORMAL LOW (ref 30.0–36.0)
MCV: 74.3 fL — AB (ref 78.0–100.0)
Monocytes Absolute: 0.5 10*3/uL (ref 0.1–1.0)
Monocytes Relative: 9 % (ref 3–12)
Neutro Abs: 3.6 10*3/uL (ref 1.7–7.7)
Neutrophils Relative %: 65 % (ref 43–77)
Platelets: 206 10*3/uL (ref 150–400)
RBC: 4.44 MIL/uL (ref 3.87–5.11)
RDW: 17.7 % — AB (ref 11.5–15.5)
WBC: 5.5 10*3/uL (ref 4.0–10.5)

## 2015-09-09 NOTE — Telephone Encounter (Signed)
Will forward to MD to make her aware.  Cephas Revard,CMA  

## 2015-09-13 NOTE — Patient Outreach (Signed)
[facility]) Care Management  09/13/2015  [name] Aug 13, 1924 [id number]   Referral from [employer] Tier 2 List, assigned Jon Billings, RN to outreach for [place] Management services.  Thanks, [name]. Carlos, [place] Assistant Phone: [phone] Fax: [phone]

## 2015-09-14 ENCOUNTER — Other Ambulatory Visit: Payer: Self-pay

## 2015-09-14 NOTE — Patient Outreach (Signed)
[facility]) Care Management  09/14/2015  [name] 1924/05/15 [id number]   Telephone call to patient for Tier 2 referral  CHF, 2 admits, 5 ED visits  Social: Patient lives in her home with her Daughter Yehudis Monceaux who is her primary care giver but she also has some health issues and is presently in the hospital. Patient reports that some of her other children have been coming over to help her and she has an aide that helps her Mon-Sat.   Medical problems: Patient admits to DM, HTN, and HF.  Patient reports that presently she is not on any diabetes medications but she checks her blood sugar every morning. This mornings check was 120.  Patient reports that her blood pressure has been good.  Patient reports that she has not had any problems with her heart failure. She reports she has not been told to weigh daily and denies any problems with shortness of breath or swelling.    Medications: Patient reports that her daughter does her pill box and that her other children have been by to make sure she takes her medication.  Patient reports she is able to afford her medications at this time.    Consent: Patient did not consent to services as her daughter is not available.  Patient would like to talk with her daughter about it.  Patient in agreement to receive brochure and letter and will call once discussed with daughter.    Plan: RN Health Coach will send letter and brochure for patient to review with her daughter [name].   RN Health Coach will plan to close case if no response in the next 10 business days.    Jone Baseman, RN, MSN [place] [phone]

## 2015-09-16 ENCOUNTER — Emergency Department (HOSPITAL_COMMUNITY): Payer: Medicare Other

## 2015-09-16 ENCOUNTER — Inpatient Hospital Stay (HOSPITAL_COMMUNITY)
Admission: EM | Admit: 2015-09-16 | Discharge: 2015-09-19 | DRG: 872 | Disposition: A | Discharge status: Home / Self Care | Payer: Medicare Other | Attending: Family Medicine | Admitting: Family Medicine

## 2015-09-16 ENCOUNTER — Encounter (HOSPITAL_COMMUNITY): Payer: Self-pay | Admitting: Emergency Medicine

## 2015-09-16 DIAGNOSIS — M109 Gout, unspecified: Secondary | ICD-10-CM | POA: Diagnosis present

## 2015-09-16 DIAGNOSIS — M1990 Unspecified osteoarthritis, unspecified site: Secondary | ICD-10-CM | POA: Diagnosis present

## 2015-09-16 DIAGNOSIS — E785 Hyperlipidemia, unspecified: Secondary | ICD-10-CM | POA: Diagnosis present

## 2015-09-16 DIAGNOSIS — Z7902 Long term (current) use of antithrombotics/antiplatelets: Secondary | ICD-10-CM | POA: Diagnosis not present

## 2015-09-16 DIAGNOSIS — Z85038 Personal history of other malignant neoplasm of large intestine: Secondary | ICD-10-CM

## 2015-09-16 DIAGNOSIS — N12 Tubulo-interstitial nephritis, not specified as acute or chronic: Secondary | ICD-10-CM | POA: Diagnosis not present

## 2015-09-16 DIAGNOSIS — R1115 Cyclical vomiting syndrome unrelated to migraine: Secondary | ICD-10-CM

## 2015-09-16 DIAGNOSIS — J45909 Unspecified asthma, uncomplicated: Secondary | ICD-10-CM | POA: Diagnosis present

## 2015-09-16 DIAGNOSIS — G4700 Insomnia, unspecified: Secondary | ICD-10-CM | POA: Diagnosis present

## 2015-09-16 DIAGNOSIS — Z720 Tobacco use: Secondary | ICD-10-CM

## 2015-09-16 DIAGNOSIS — Z7982 Long term (current) use of aspirin: Secondary | ICD-10-CM | POA: Diagnosis not present

## 2015-09-16 DIAGNOSIS — I5030 Unspecified diastolic (congestive) heart failure: Secondary | ICD-10-CM | POA: Insufficient documentation

## 2015-09-16 DIAGNOSIS — Z66 Do not resuscitate: Secondary | ICD-10-CM | POA: Diagnosis present

## 2015-09-16 DIAGNOSIS — I2510 Atherosclerotic heart disease of native coronary artery without angina pectoris: Secondary | ICD-10-CM | POA: Diagnosis present

## 2015-09-16 DIAGNOSIS — K219 Gastro-esophageal reflux disease without esophagitis: Secondary | ICD-10-CM | POA: Diagnosis present

## 2015-09-16 DIAGNOSIS — I5042 Chronic combined systolic (congestive) and diastolic (congestive) heart failure: Secondary | ICD-10-CM | POA: Diagnosis present

## 2015-09-16 DIAGNOSIS — R112 Nausea with vomiting, unspecified: Secondary | ICD-10-CM | POA: Diagnosis not present

## 2015-09-16 DIAGNOSIS — Z853 Personal history of malignant neoplasm of breast: Secondary | ICD-10-CM

## 2015-09-16 DIAGNOSIS — Z79899 Other long term (current) drug therapy: Secondary | ICD-10-CM | POA: Diagnosis not present

## 2015-09-16 DIAGNOSIS — I739 Peripheral vascular disease, unspecified: Secondary | ICD-10-CM | POA: Diagnosis present

## 2015-09-16 DIAGNOSIS — R569 Unspecified convulsions: Secondary | ICD-10-CM | POA: Diagnosis not present

## 2015-09-16 DIAGNOSIS — R109 Unspecified abdominal pain: Secondary | ICD-10-CM | POA: Diagnosis not present

## 2015-09-16 DIAGNOSIS — I110 Hypertensive heart disease with heart failure: Secondary | ICD-10-CM | POA: Diagnosis present

## 2015-09-16 DIAGNOSIS — R251 Tremor, unspecified: Secondary | ICD-10-CM | POA: Diagnosis not present

## 2015-09-16 DIAGNOSIS — D638 Anemia in other chronic diseases classified elsewhere: Secondary | ICD-10-CM | POA: Diagnosis present

## 2015-09-16 DIAGNOSIS — E1151 Type 2 diabetes mellitus with diabetic peripheral angiopathy without gangrene: Secondary | ICD-10-CM | POA: Diagnosis present

## 2015-09-16 DIAGNOSIS — A419 Sepsis, unspecified organism: Secondary | ICD-10-CM | POA: Diagnosis present

## 2015-09-16 DIAGNOSIS — R1110 Vomiting, unspecified: Secondary | ICD-10-CM | POA: Diagnosis not present

## 2015-09-16 LAB — COMPREHENSIVE METABOLIC PANEL
ALK PHOS: 40 U/L (ref 38–126)
ALT: 8 U/L — AB (ref 14–54)
AST: 17 U/L (ref 15–41)
Albumin: 3.2 g/dL — ABNORMAL LOW (ref 3.5–5.0)
Anion gap: 9 (ref 5–15)
BUN: 12 mg/dL (ref 6–20)
CALCIUM: 10.9 mg/dL — AB (ref 8.9–10.3)
CHLORIDE: 104 mmol/L (ref 101–111)
CO2: 24 mmol/L (ref 22–32)
CREATININE: 1.13 mg/dL — AB (ref 0.44–1.00)
GFR, EST AFRICAN AMERICAN: 48 mL/min — AB (ref >60)
GFR, EST NON AFRICAN AMERICAN: 42 mL/min — AB (ref >60)
Glucose, Bld: 174 mg/dL — ABNORMAL HIGH (ref 65–99)
Potassium: 3.7 mmol/L (ref 3.5–5.1)
Sodium: 137 mmol/L (ref 135–145)
Total Bilirubin: 0.5 mg/dL (ref 0.3–1.2)
Total Protein: 7.4 g/dL (ref 6.5–8.1)

## 2015-09-16 LAB — URINE MICROSCOPIC-ADD ON

## 2015-09-16 LAB — I-STAT TROPONIN, ED
Troponin i, poc: 0 ng/mL (ref 0.00–0.08)
Troponin i, poc: 0.01 ng/mL (ref 0.00–0.08)

## 2015-09-16 LAB — PROTIME-INR
INR: 1.11 (ref 0.00–1.49)
Prothrombin Time: 14.5 seconds (ref 11.6–15.2)

## 2015-09-16 LAB — URINALYSIS, ROUTINE W REFLEX MICROSCOPIC
BILIRUBIN URINE: NEGATIVE
GLUCOSE, UA: NEGATIVE mg/dL
Hgb urine dipstick: NEGATIVE
KETONES UR: NEGATIVE mg/dL
NITRITE: POSITIVE — AB
PH: 7 (ref 5.0–8.0)
PROTEIN: 30 mg/dL — AB
Specific Gravity, Urine: 1.011 (ref 1.005–1.030)
Urobilinogen, UA: 0.2 mg/dL (ref 0.0–1.0)

## 2015-09-16 LAB — CBC
HCT: 32.7 % — ABNORMAL LOW (ref 36.0–46.0)
Hemoglobin: 10.3 g/dL — ABNORMAL LOW (ref 12.0–15.0)
MCH: 22.2 pg — AB (ref 26.0–34.0)
MCHC: 31.5 g/dL (ref 30.0–36.0)
MCV: 70.6 fL — AB (ref 78.0–100.0)
PLATELETS: 190 10*3/uL (ref 150–400)
RBC: 4.63 MIL/uL (ref 3.87–5.11)
RDW: 16.2 % — AB (ref 11.5–15.5)
WBC: 4.5 10*3/uL (ref 4.0–10.5)

## 2015-09-16 LAB — LACTIC ACID, PLASMA: LACTIC ACID, VENOUS: 1.5 mmol/L (ref 0.5–2.0)

## 2015-09-16 LAB — PROCALCITONIN

## 2015-09-16 LAB — APTT: aPTT: 28 seconds (ref 24–37)

## 2015-09-16 LAB — TROPONIN I: Troponin I: <0.03 ng/mL (ref <0.031)

## 2015-09-16 LAB — I-STAT CG4 LACTIC ACID, ED: Lactic Acid, Venous: 2.03 mmol/L (ref 0.5–2.0)

## 2015-09-16 LAB — LIPASE, BLOOD: Lipase: 47 U/L (ref 11–51)

## 2015-09-16 MED ORDER — SODIUM CHLORIDE 0.9 % IV BOLUS (SEPSIS)
1000.0000 mL | INTRAVENOUS | Status: AC
  Administered 2015-09-16: 1000 mL via INTRAVENOUS

## 2015-09-16 MED ORDER — ONDANSETRON HCL 4 MG PO TABS: 4.0000 mg | ORAL_TABLET | Freq: Four times a day (QID) | ORAL | Status: DC | PRN

## 2015-09-16 MED ORDER — ONDANSETRON HCL 4 MG/2ML IJ SOLN
4.0000 mg | Freq: Once | INTRAMUSCULAR | Status: AC
  Administered 2015-09-16: 4 mg via INTRAVENOUS
  Filled 2015-09-16: qty 2

## 2015-09-16 MED ORDER — SODIUM CHLORIDE 0.9 % IV BOLUS (SEPSIS)
1000.0000 mL | Freq: Once | INTRAVENOUS | Status: AC
  Administered 2015-09-16: 1000 mL via INTRAVENOUS

## 2015-09-16 MED ORDER — SODIUM CHLORIDE 0.9 % IV SOLN
INTRAVENOUS | Status: DC
Start: 2015-09-16 — End: 2015-09-18
  Administered 2015-09-16 – 2015-09-17 (×3): via INTRAVENOUS

## 2015-09-16 MED ORDER — DEXTROSE 5 % IV SOLN
1.0000 g | INTRAVENOUS | Status: DC
  Administered 2015-09-17 – 2015-09-18 (×2): 1 g via INTRAVENOUS
  Filled 2015-09-16 (×3): qty 10

## 2015-09-16 MED ORDER — ONDANSETRON HCL 4 MG/2ML IJ SOLN
4.0000 mg | Freq: Four times a day (QID) | INTRAMUSCULAR | Status: DC | PRN
  Administered 2015-09-18 (×2): 4 mg via INTRAVENOUS
  Filled 2015-09-16 (×2): qty 2

## 2015-09-16 MED ORDER — DEXTROSE 5 % IV SOLN
1.0000 g | Freq: Once | INTRAVENOUS | Status: AC
  Administered 2015-09-16: 1 g via INTRAVENOUS
  Filled 2015-09-16: qty 10

## 2015-09-16 MED ORDER — HEPARIN SODIUM (PORCINE) 5000 UNIT/ML IJ SOLN
5000.0000 [IU] | Freq: Three times a day (TID) | INTRAMUSCULAR | Status: DC
  Administered 2015-09-16 – 2015-09-19 (×8): 5000 [IU] via SUBCUTANEOUS
  Filled 2015-09-16 (×8): qty 1

## 2015-09-16 MED ORDER — BISACODYL 10 MG RE SUPP: 10.0000 mg | Freq: Every day | RECTAL | Status: DC | PRN

## 2015-09-16 NOTE — ED Notes (Signed)
Pt states she woke up this am with tremors all over her body and n/v. Pt is "spitting up" in triage. Pt is alert and ox4. denies any pain just states she does not feel well.

## 2015-09-16 NOTE — ED Notes (Signed)
Pt in waiting room noted to have full body shaking and 20 sec of not responding. RN and NT checked pt vital signs pt HR noted to be 136 oxygen sats noted to 98% on room air. After period of unresponsiveness as pt being taken back to room, pt alert and oriented, no injury noted to tongue. Pt able to give history, states has had seizures in past before and takes dilantin. Primary RN Jerene Pitch updated and informed. Pt placed on heart monitor. Family at bedside.

## 2015-09-16 NOTE — H&P (Signed)
[facility] Admission History and Physical Service Pager: [phone]  Patient name: JENNIER SCHISSLER Medical record number: [id number] Date of birth: 1923/12/08 Age: 81 y.o. Gender: female  Primary Care Provider: Kathrine Cords, MD Consultants:  None  Code Status: DNR   Chief Complaint: Rigors  Assessment and Plan: DANITRA PAYANO is a 81 y.o. female presenting with rigors. PMH is significant for T2DM, HTN, HLD, Anemia, HFpEF, GERD, asthma, hx of colon and breast cancer, and h/o MI.  #Sepsis: Symptoms most likely secondary to pyelonephritis. Patient presenting likely with rigors and emesis. Meeting SIRS criteria  RR 24 and HR 107. However patient afebrile with WBC wnl. UA was positive leukocytes, nitrates, and many bacteria. Patient with several episodes of emesis including 3 x times while in the room with patient. Lactic acid 2.03. ABX negative for small bowel obstruction. CXR was negative for any acute signs of infection. - Admit to med-surg, attending  Dr. Fortunato Curling - Urine cultures and blood cultures - s/p 1L of NS - CBC in the AM  - Will continue trending lactic acid  - Will get procalcitonin  - Will trend troponin - Will continue Ceftriaxone per sepsis protocal,  S/p Ceftriaxzone in the ED   - Monitor vitals every 4 hours  - CBC and BMP  # Hx of Seizures: Per Patient had seizures 60 years ago. Patient had witness generalized shaking on three different occasions prior to presentation and in the waiting room in ED. Did not having any tongue biting or post ictal state. Urinary incontinence at baseline and did not have any bowel incontinence. Episode reported to last 5 seconds with eyes rolling in the back of her head and not able to communicate.  Likely rigors in setting of infection.  Distant history of taking Dilantin. She is unsure of the last time she has seen a neurologist.  - CT head was negative for any acute process  - Consider neurology consult -  Consider EEG  #Emesis: most likely related to above problem. KUB not revealing for any obstruction. Has been having constipation and having to use laxative to have BM. Reported last BM yesterday. Abdominal exam is reassuring.  - currently holding medications by mouth  - consider re-starting oral medications once emesis has improved - zofran PRN IV or PO   # HTN: BP 147/66 - Hold Norvasc and Cozaar   # HFpEF: Last echo in March 2016 showed normal EF and grade 1 diastolic dysfunction. CXR showing no overload and no edema on exam  - Holding coreg  - Monitor volume status   #Microcytic Anemia: history of iron deficiency anemia but more likely to be anemia of chronic disease. Hx of colon ca. Denis any melena or hematochezia  - consider testing ferritin, hx of elevated ferritin  - may benefit from iron therapy in setting of HF.   - Will continue to trend CBC   #DM2: A1c 6.7 this month - controlled with no medication   #CAD/PAD:stable - continue home ASA and crestor  #Hx of breast and colon cancer: continue tamoxifen until Jan '17, placed on tamoxifen initially Jan '12. Bilateral mastectomies and bilateral axillary lymph node dissection in 2001. On the right breast, showed a T1N0M0 left breast to have T1N1M0, both of them stained strongly for estrogen receptor positivity.   FEN/GI:  PPI  Prophylaxis: Lovenox   Disposition: Telemetry   History of Present Illness:  RENDA POHLMAN is a 81 y.o. female presenting with emesis, rigors, concerning  for urosepsis. Last Friday was seen in the [facility], and was empirical treated with Tamiflu for possible Flu, CXR was negative for any acute process. However since then patient felt she had been recovering from illness. However today patient started experience shaking spells. She states the is not sure if she is conscious when these take place, they typical last for a few secs. She said she had shaking spells, at her house this AM, then called EMS. EMS brought  her to hospital where she said she had another such shaking spell. She also indicates that has not been able to keep anything down today. She has vomited multiple times, clear liquid. Patient without blood in vomit. Patient does indicate that she has been constipated, however had a bowel movement yesterday after laxative. Patient denies any sick contacts. No bowel/bladder incontinence. Furthermore, patient denies dysuria, however is incontinent (changes diapers 3 times a day) and therefore can not comment on frequency of urination.    Review Of Systems: Per HPI with the following additions:None.  Otherwise the remainder of the systems were negative.  Patient Active Problem List   Diagnosis Date Noted  . Chest congestion 09/08/2015  . Gout 05/14/2015  . UTI (urinary tract infection) 04/15/2015  . Acute delirium 04/15/2015  . Fever 04/07/2015  . Hypertension 04/07/2015  . Dyslipidemia   . Essential hypertension   . Seasonal allergies 02/10/2015  . Nausea with vomiting 02/10/2015  . Flank pain 02/10/2015  . Stroke-like symptoms   . Stroke-like episode ([place]) 01/25/2015  . Syncope and collapse 01/25/2015  . Right leg pain   . Accelerated hypertension   . Hypertensive emergency 09/19/2014  . Dizziness 09/19/2014  . Anemia of chronic illness 08/19/2014  . Acute superficial venous thrombosis of right lower extremity 08/19/2014  . Right ankle pain 08/10/2014  . Hypercalcemia 06/09/2014  . Breast cancer, left breast ([place]) 06/09/2014  . Breast cancer, right breast ([place]) 06/09/2014  . Edema of left lower extremity 03/30/2014  . PAD (peripheral artery disease) ([place]) 03/30/2014  . Colon cancer ([place]) 12/11/2013  . Neck pain 10/22/2013  . History of fall 05/15/2013  . Right thigh pain 05/15/2013  . Memory deficit 05/15/2013  . Bilateral leg pain 03/07/2013  . Trigger thumb of right hand 03/07/2013  . Lower extremity edema 02/19/2013  . Weakness generalized 01/22/2013  . Stricture and  stenosis of esophagus 10/27/2012  . Poor social situation 09/13/2012  . Low back pain 08/07/2012  . Musculoskeletal chest pain 07/10/2012  . Thoracic back pain 05/24/2012  . Weight loss 04/03/2012  . Insomnia 09/25/2011  . Tobacco abuse 06/02/2011  . History of breast cancer in female, bilateral.  Mastectomies 10/24/2010. 04/26/2011  . Hiatal hernia 03/22/2011  . Colon polyp 03/22/2011  . Poor circulation 03/22/2011  . 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 NEUROPATHY 08/25/2009  . ANEMIA-IRON DEFICIENCY 04/29/2007  . HLD (hyperlipidemia) 01/29/2007  . Diabetes type 2, controlled ([place]) 01/10/2007  . HYPERTENSION, BENIGN SYSTEMIC 01/10/2007  . Coronary atherosclerosis 01/10/2007  . Chronic systolic heart failure ([place]) 01/10/2007  . Asthma 01/10/2007  . Reflux esophagitis 01/10/2007  . Osteoarthritis, multiple sites 01/10/2007    Past Medical History: Past Medical History  Diagnosis Date  . Breast cancer ([place]) 10/2010    Invasive Ductal Carcinoma, s/p bilateral mastectomy, now on Tamoxifen. Followed by Dr Jamse Arn.   . Diabetes mellitus   . Hypertension   . Hyperlipidemia   . Anemia     Iron def anemia  . CAD (coronary artery disease)   .  CHF (congestive heart failure) (H[facility])     Systolic   . History of colon cancer   . Neuropathy ([place])   . Allergy   . GERD (gastroesophageal reflux disease)   . Hiatal hernia   . Diverticulosis   . Asthma   . Arthritis   . H/O: GI bleed   . Breast cancer ([place]) 2011    s/p Bilateral masectomy  . Shortness of breath   . Pneumonia 03/2012  . Carotid artery aneurysm ([place]) 08/2010    right ICA, 5 x 27mm  . DDD (degenerative disc disease), cervical   . Colon cancer ([place])   . Colon cancer ([place]) 12/11/2013  . Myocardial infarction ([place])   . Hypercalcemia 06/09/2014    Past Surgical History: Past Surgical History  Procedure Laterality Date  . Breast masectomy  10/2010    Bilateral masectomy by Dr Lucia Gaskins s/p invasive ductal  carcinoma.  . Cardiac  catherization  2007    Severe 3-vessel disease.  EF 20-25%.  . Esophagogastroduodenoscopy  2001    Esophageal Tear  . Total knee arthroplasty  2001  . Esophagogastroduodenoscopy  10/27/2012    Procedure: ESOPHAGOGASTRODUODENOSCOPY (EGD);  Surgeon: Irene Shipper, MD;  Location: [facility] ENDOSCOPY;  Service: Endoscopy;  Laterality: N/A;  pat  . Breast surgery    . Joint replacement Right 2001    Knee    Social History: Social History  Substance Use Topics  . Smoking status: Never Smoker   . Smokeless tobacco: Current User    Types: Snuff  . Alcohol Use: No   Additional social history: Lives with daughter and son  Please also refer to relevant sections of EMR.  Family History: Family History  Problem Relation Age of Onset  . Sickle cell anemia Other   . Heart disease Mother   . Heart disease Father   . Cancer Daughter 29    breast ca  . Hypertension Daughter   . Cancer Daughter 67    breast ca  . Hypertension Daughter   . Heart disease Son     Poor circulation-Left Leg  . Diabetes Daughter   . Hypertension Daughter   . Hyperlipidemia Daughter     Poor circulation- Toe amputation   Allergies and Medications: Allergies  Allergen Reactions  . Peanuts [Nuts] Swelling  . Lisinopril Cough   No current facility-administered medications on file prior to encounter.   Current Outpatient Prescriptions on File Prior to Encounter  Medication Sig Dispense Refill  . albuterol (PROVENTIL HFA;VENTOLIN HFA) 108 (90 BASE) MCG/ACT inhaler Inhale 2 puffs into the lungs every 6 (six) hours as needed. For shortness of breath 1 Inhaler 6  . amLODipine (NORVASC) 5 MG tablet TAKE 1 TABLET (5 MG TOTAL) BY MOUTH DAILY. 90 tablet 0  . aspirin (ASPIRIN CHILDRENS) 81 MG chewable tablet Chew 81 mg by mouth daily.      . clopidogrel (PLAVIX) 75 MG tablet TAKE 1 TABLET (75 MG TOTAL) BY MOUTH DAILY. 30 tablet 2  . diclofenac sodium (VOLTAREN) 1 % GEL APPLY 2 GRAMS TOPICALLY 4 TIMES  DAILY AS NEEDED FOR PAIN 100 g 0  . gabapentin (NEURONTIN) 300 MG capsule TAKE ONE CAPSULE BY MOUTH TWICE A DAY 60 capsule 1  . losartan (COZAAR) 50 MG tablet TAKE 1 TABLET (50 MG TOTAL) BY MOUTH AT BEDTIME. 30 tablet 0  . magnesium hydroxide (MILK OF MAGNESIA) 400 MG/5ML suspension Take 30 mLs by mouth daily as needed for mild constipation.    . ranitidine (ZANTAC)  150 MG capsule TAKE ONE CAPSULE BY MOUTH TWICE A DAY AS NEEDED FOR HEARTBURN 60 capsule 3  . rosuvastatin (CRESTOR) 20 MG tablet Take 1 tablet (20 mg total) by mouth at bedtime. 30 tablet 2  . VOLTAREN 1 % GEL APPLY 2 GRAMS TOPICALLY 4 (FOUR) TIMES DAILY AS NEEDED FOR PAIN 100 g 0  . acetaminophen (TYLENOL) 500 MG tablet Take 500 mg by mouth every 6 (six) hours as needed for moderate pain (pain).    . Blood Pressure KIT Use the blood pressure kit to check you blood pressure twice a day 1 each 0  . carvedilol (COREG) 12.5 MG tablet Take 1 tablet (12.5 mg total) by mouth 2 (two) times daily with a meal. 60 tablet 3  . clopidogrel (PLAVIX) 75 MG tablet TAKE 1 TABLET BY MOUTH EVERY DAY 30 tablet 2  . colchicine 0.6 MG tablet Take 2 tabs by mouth for gout, then 1 tab in one hour. Do not take more for 3 days. (Patient not taking: Reported on 09/16/2015) 9 tablet 0  . Dextromethorphan HBr 15 MG CAPS Take 2 capsules (30 mg total) by mouth every 6 (six) hours as needed. (Patient not taking: Reported on 09/16/2015) 20 each 0  . glucose blood test strip Use as instructed Dx. Code 250.02 100 each 12  . oseltamivir (TAMIFLU) 30 MG capsule Take 1 capsule (30 mg total) by mouth 2 (two) times daily. (Patient not taking: Reported on 09/16/2015) 10 capsule 0  . tamoxifen (NOLVADEX) 20 MG tablet TAKE 1 TABLET (20 MG TOTAL) BY MOUTH DAILY. (Patient not taking: Reported on 09/16/2015) 30 tablet 3  . traMADol (ULTRAM) 50 MG tablet Take 1 tablet (50 mg total) by mouth every 8 (eight) hours as needed for severe pain. 75 tablet 0  . [DISCONTINUED] cetirizine  (ZYRTEC) 5 MG tablet Take 1 tablet (5 mg total) by mouth daily. Take at night time as it can cause some drowsiness 30 tablet 4    Objective: BP 131/39 mmHg  Pulse 95  Temp(Src) 99.2 F (37.3 C) (Rectal)  Resp 18  Wt 120 lb (54.432 kg)  SpO2 100% Exam: General: Patient lying in bed, vomited several times during interview, Frail appearing  Eyes: Pupils Equal Round Reactive to light,  Conjunctiva without redness or discharge ENTM: Moist mucosa membranes  Neck: No lymphadenopathy noted  Cardiovascular: RRR, no murmurs, rubs, gallops  Respiratory: CTAB, no wheezes or rhonchi, normal effort, speaking in full sentences.  Abdomen: BS+, no ttp, no rebound  MSK: No lower extremity edema, Skin: no sores or suspicious lesions or rashes or color changes Neuro:  Strength equal & normal in upper & lower extremities Psych: Alert and orientated   Labs and Imaging: CBC BMET   Recent Labs Lab 09/16/15 1412  WBC 4.5  HGB 10.3*  HCT 32.7*  PLT 190    Recent Labs Lab 09/16/15 1412  NA 137  K 3.7  CL 104  CO2 24  BUN 12  CREATININE 1.13*  GLUCOSE 174*  CALCIUM 10.9*     Dg Chest 2 View  09/08/2015  CLINICAL DATA:  Productive cough with right-sided chest pain. History of diabetes, hypertension and congestive heart failure. Initial encounter. EXAM: CHEST  2 VIEW COMPARISON:  04/07/2015 and 04/06/2015. FINDINGS: There is interval improved aeration of the lung bases which are now nearly clear. There is minimal left basilar atelectasis or scarring. The heart size and mediastinal contours are stable. There is no pleural effusion or pneumothorax. The bones appear  unchanged. IMPRESSION: No active cardiopulmonary process. Interval improved aeration of the lung bases. Electronically Signed   By: Richardean Sale M.D.   On: 09/08/2015 16:33   Dg Abd 1 View  09/16/2015  CLINICAL DATA:  81 year old female with abdominal pain and nausea and vomiting today. EXAM: ABDOMEN - 1 VIEW COMPARISON:   01/25/2014 CT. FINDINGS: Mild gaseous distension of the stomach noted. The bowel gas pattern is otherwise unremarkable. No dilated small bowel loops or colon noted. Surgical sutures in the region of the rectum noted. No acute bony abnormalities present. No suspicious calcifications are identified. IMPRESSION: Mild nonspecific gaseous distension of the stomach. Bowel gas pattern otherwise unremarkable. Electronically Signed   By: Margarette Canada M.D.   On: 09/16/2015 19:12   Ct Head Wo Contrast  09/16/2015  CLINICAL DATA:  Tremors, n/v this morning when waking up. Pt had a period of unresponsiveness and shaking while in waiting room. Pt denies any complaints. Pt states she has had the flu. EXAM: CT HEAD WITHOUT CONTRAST TECHNIQUE: Contiguous axial images were obtained from the base of the skull through the vertex without intravenous contrast. COMPARISON:  01/25/2015 head CT and brain MR. FINDINGS: Sinuses/Soft tissues: Fluid in the right maxillary sinus. Partial opacification of ethmoid air cells. Clear mastoid air cells. Intracranial: Expected cerebral atrophy for age. Moderate low density in the periventricular white matter likely related to small vessel disease. Cerebellar atrophy is also expected for age. No mass lesion, hemorrhage, hydrocephalus, acute infarct, intra-axial, or extra-axial fluid collection. IMPRESSION: 1.  No acute intracranial abnormality. 2. Small vessel ischemic change. Expected for age cerebral and cerebellar atrophy. 3. Sinus disease Electronically Signed   By: Abigail Miyamoto M.D.   On: 09/16/2015 19:00   Dg Chest Portable 1 View  09/16/2015  CLINICAL DATA:  Vomiting EXAM: PORTABLE CHEST 1 VIEW COMPARISON:  09/08/2015 FINDINGS: The heart size and mediastinal contours are within normal limits. Both lungs are clear. The visualized skeletal structures are unremarkable. IMPRESSION: No active disease. Electronically Signed   By: Franchot Gallo M.D.   On: 09/16/2015 15:57     Asiyah Cletis Media, MD 09/16/2015, 6:14 PM PGY-1, [place] [facility] pager: [phone], text pages welcome  Upper Level Addendum:  I have seen and evaluated this patient along with Dr. Emmaline Life and reviewed the above note, making necessary revisions in [facility].   Clearance Coots, MD Family Medicine PGY-3

## 2015-09-16 NOTE — ED Notes (Signed)
Pt transported to CT ?

## 2015-09-16 NOTE — ED Provider Notes (Signed)
Arrival Date & Time: 09/16/15 & 1339 History   Chief Complaint  Patient presents with  . Nausea   HPI [name] is a 81 y.o. female who presents to the emergency department after patient had emesis several times without noted blood or bright green contents. This event occurred approximately around noon today after patient took her Tamiflu which she was prescribed by her family physician after she was assessed this past Friday for concerning viral URI versus flu like symptoms. Patient states that she has had several days of fever and chills which resulted in deep shaking however states that she has no chest pain shortness of breath abdominal pain or nausea associated with emesis. Patient has remarkable history for prior colon cancer coronary artery disease congestive heart failure hypertension diabetes prior hiatal hernia and diverticulosis and prior myocardial infarction. States she is having significant increase in fluid intake as she is "always thirsty". States emesis tastes like blood however none noted in emesis.  Past Medical History  I reviewed & agree with nursing's documentation on PMHx, PSHx, SHx and FHx. Past Medical History  Diagnosis Date  . Breast cancer ([place]) 10/2010    Invasive Ductal Carcinoma, s/p bilateral mastectomy, now on Tamoxifen. Followed by Dr Jamse Arn.   . Diabetes mellitus   . Hypertension   . Hyperlipidemia   . Anemia     Iron def anemia  . CAD (coronary artery disease)   . CHF (congestive heart failure) (HCC)     Systolic   . History of colon cancer   . Neuropathy ([place])   . Allergy   . GERD (gastroesophageal reflux disease)   . Hiatal hernia   . Diverticulosis   . Asthma   . Arthritis   . H/O: GI bleed   . Breast cancer ([place]) 2011    s/p Bilateral masectomy  . Shortness of breath   . Pneumonia 03/2012  . Carotid artery aneurysm ([place]) 08/2010    right ICA, 5 x 2mm  . DDD (degenerative disc disease), cervical   . Colon cancer ([place])   . Colon cancer  ([place]) 12/11/2013  . Myocardial infarction ([place])   . Hypercalcemia 06/09/2014   Past Surgical History  Procedure Laterality Date  . Breast masectomy  10/2010    Bilateral masectomy by Dr Lucia Gaskins s/p invasive ductal carcinoma.  . Cardiac  catherization  2007    Severe 3-vessel disease.  EF 20-25%.  . Esophagogastroduodenoscopy  2001    Esophageal Tear  . Total knee arthroplasty  2001  . Esophagogastroduodenoscopy  10/27/2012    Procedure: ESOPHAGOGASTRODUODENOSCOPY (EGD);  Surgeon: Irene Shipper, MD;  Location[facility] ENDOSCOPY;  Service: Endoscopy;  Laterality: N/A;  pat  . Breast surgery    . Joint replacement Right 2001    Knee   Social History   Social History  . Marital Status: Widowed    Spouse Name: N/A  . Number of Children: N/A  . Years of Education: N/A   Social History Main Topics  . Smoking status: Never Smoker   . Smokeless tobacco: Current User    Types: Snuff  . Alcohol Use: No  . Drug Use: No  . Sexual Activity: No   Other Topics Concern  . None   Social History Narrative   Widowed.  She lives with her son and has 2 daughters who help with her care.  She has a large extended family in the area who are very involved.    Family History  Problem Relation  Age of Onset  . Sickle cell anemia Other   . Heart disease Mother   . Heart disease Father   . Cancer Daughter 57    breast ca  . Hypertension Daughter   . Cancer Daughter 49    breast ca  . Hypertension Daughter   . Heart disease Son     Poor circulation-Left Leg  . Diabetes Daughter   . Hypertension Daughter   . Hyperlipidemia Daughter     Poor circulation- Toe amputation    Review of Systems   Complete ROS performed by me, all positives & negatives documented as above in HPI. All other ROS negative.  Allergies  Peanuts and Lisinopril  Home Medications   Prior to Admission medications   Medication Sig Start Date End Date Taking? Authorizing Provider  albuterol (PROVENTIL HFA;VENTOLIN HFA)  108 (90 BASE) MCG/ACT inhaler Inhale 2 puffs into the lungs every 6 (six) hours as needed. For shortness of breath 01/18/15  Yes Crystal Libby Maw, MD  amLODipine (NORVASC) 5 MG tablet TAKE 1 TABLET (5 MG TOTAL) BY MOUTH DAILY. 08/16/15  Yes [name], MD  aspirin (ASPIRIN CHILDRENS) 81 MG chewable tablet Chew 81 mg by mouth daily.     Yes Historical Provider, MD  clopidogrel (PLAVIX) 75 MG tablet TAKE 1 TABLET (75 MG TOTAL) BY MOUTH DAILY. 06/01/15  Yes [name], MD  diclofenac sodium (VOLTAREN) 1 % GEL APPLY 2 GRAMS TOPICALLY 4 TIMES DAILY AS NEEDED FOR PAIN 08/25/15  Yes [name], MD  gabapentin (NEURONTIN) 300 MG capsule TAKE ONE CAPSULE BY MOUTH TWICE A DAY 07/26/15  Yes [name], MD  losartan (COZAAR) 50 MG tablet TAKE 1 TABLET (50 MG TOTAL) BY MOUTH AT BEDTIME. 08/23/15  Yes Virginia Crews, MD  magnesium hydroxide (MILK OF MAGNESIA) 400 MG/5ML suspension Take 30 mLs by mouth daily as needed for mild constipation.   Yes Historical Provider, MD  ranitidine (ZANTAC) 150 MG capsule TAKE ONE CAPSULE BY MOUTH TWICE A DAY AS NEEDED FOR HEARTBURN 03/16/15  Yes [name], MD  rosuvastatin (CRESTOR) 20 MG tablet Take 1 tablet (20 mg total) by mouth at bedtime. 08/25/15  Yes Crystal Libby Maw, MD  VOLTAREN 1 % GEL APPLY 2 GRAMS TOPICALLY 4 (FOUR) TIMES DAILY AS NEEDED FOR PAIN 07/08/15  Yes [name], MD  acetaminophen (TYLENOL) 500 MG tablet Take 500 mg by mouth every 6 (six) hours as needed for moderate pain (pain).    Historical Provider, MD  Blood Pressure KIT Use the blood pressure kit to check you blood pressure twice a day 08/27/15   [name], MD  carvedilol (COREG) 12.5 MG tablet Take 1 tablet (12.5 mg total) by mouth 2 (two) times daily with a meal. 07/28/15   [name], MD  clopidogrel (PLAVIX) 75 MG tablet TAKE 1 TABLET BY MOUTH EVERY DAY 08/31/15   [name], MD  colchicine 0.6 MG tablet Take 2 tabs by mouth for gout, then 1 tab in  one hour. Do not take more for 3 days. Patient not taking: Reported on 09/16/2015 05/14/15   Olin Hauser, DO  Dextromethorphan HBr 15 MG CAPS Take 2 capsules (30 mg total) by mouth every 6 (six) hours as needed. Patient not taking: Reported on 09/16/2015 09/08/15   Olam Idler, MD  glucose blood test strip Use as instructed Dx. Code 250.02 09/28/14   [name], MD  oseltamivir (TAMIFLU) 30 MG capsule  Take 1 capsule (30 mg total) by mouth 2 (two) times daily. Patient not taking: Reported on 09/16/2015 09/08/15   Olam Idler, MD  tamoxifen (NOLVADEX) 20 MG tablet TAKE 1 TABLET (20 MG TOTAL) BY MOUTH DAILY. Patient not taking: Reported on 09/16/2015 05/19/15   [name], MD  traMADol (ULTRAM) 50 MG tablet Take 1 tablet (50 mg total) by mouth every 8 (eight) hours as needed for severe pain. 08/27/15   [name], MD    Physical Exam  BP 158/68 mmHg  Pulse 101  Temp(Src) 98.8 F (37.1 C) (Oral)  Resp 16  Wt 120 lb (54.432 kg)  SpO2 100% Physical Exam  Constitutional: She is oriented to person, place, and time. She appears well-developed and well-nourished.  Non-toxic appearance. She has a sickly appearance. She appears ill. No distress.  HENT:  Head: Normocephalic and atraumatic.  Right Ear: External ear normal.  Left Ear: External ear normal.  Eyes: Pupils are equal, round, and reactive to light. No scleral icterus.  Neck: Normal range of motion. Neck supple. No tracheal deviation present.  Cardiovascular: Normal heart sounds and intact distal pulses.   No murmur heard. Pulmonary/Chest: Effort normal and breath sounds normal. No stridor. No respiratory distress. She has no wheezes. She has no rales.  Abdominal: Soft. Bowel sounds are normal. She exhibits no distension. There is no tenderness. There is no rebound and no guarding.  Musculoskeletal: Normal range of motion.  Neurological: She is alert and oriented to person, place, and time. She has normal  strength and normal reflexes. No cranial nerve deficit or sensory deficit.  Skin: Skin is warm. She is diaphoretic. No pallor.  Psychiatric: She has a normal mood and affect. Her behavior is normal.  Nursing note and vitals reviewed.   ED Course  Procedures Labs Review Labs Reviewed  COMPREHENSIVE METABOLIC PANEL - Abnormal; Notable for the following:    Glucose, Bld 174 (*)    Creatinine, Ser 1.13 (*)    Calcium 10.9 (*)    Albumin 3.2 (*)    ALT 8 (*)    GFR calc non Af Amer 42 (*)    GFR calc Af Amer 48 (*)    All other components within normal limits  CBC - Abnormal; Notable for the following:    Hemoglobin 10.3 (*)    HCT 32.7 (*)    MCV 70.6 (*)    MCH 22.2 (*)    RDW 16.2 (*)    All other components within normal limits  LIPASE, BLOOD  URINALYSIS, ROUTINE W REFLEX MICROSCOPIC (NOT AT [facility])  I-STAT TROPOININ, ED   Imaging Review No results found.  Laboratory and Imaging results were personally reviewed by myself and used in the medical decision making of this patient's treatment and disposition.  EKG Interpretation  EKG Interpretation  Date/Time:    Ventricular Rate:    PR Interval:    QRS Duration:   QT Interval:    QTC Calculation:   R Axis:     Text Interpretation:        MDM  JOHANA HOPKINSON is a 81 y.o. female with H&P as above. ED clinical course as follows:  Patient is hemodynamically stable afebrile without altered mental status no signs of hypoperfusion therefore do not suspect sepsis at this time. Her laboratory work which is otherwise unremarkable and notable urine for significant urinary tract infection concern for pyelonephritis versus UTI. Patient has only a mildly elevated lactate which given patient's endorsement  of poor by mouth intake I believe will correct with gentle intravenous fluid resuscitation. Patient is still symptomatic unable tolerate by mouth after intravenous antiemetics and I believe patient is unstable for discharge at this time  therefore I consulted family medicine and they will admit patient for further observation and management occult cultures sent and urine cultures obtained and given patient's remarkable history for repeated urinary tract infections I personally reviewed patient's prior urine cultures that show infections are sensitive to Rocephin. Therefore I administered Rocephin 1 g patient hemolytically stable and appropriate for transport to floor this time.  Disposition: Admit  Clinical Impression: No diagnosis found. Patient care discussed with Dr. Tomi Bamberger, who oversaw their evaluation & treatment & voiced agreement. House Officer: Voncille Lo, MD, Emergency Medicine.  Voncille Lo, MD 09/16/15 [id number]  Dorie Rank, MD 09/16/15 [phone]

## 2015-09-16 NOTE — ED Notes (Signed)
Phlebotomy notified to draw labs

## 2015-09-17 DIAGNOSIS — R1110 Vomiting, unspecified: Secondary | ICD-10-CM

## 2015-09-17 DIAGNOSIS — I2510 Atherosclerotic heart disease of native coronary artery without angina pectoris: Secondary | ICD-10-CM

## 2015-09-17 LAB — BASIC METABOLIC PANEL
Anion gap: 7 (ref 5–15)
BUN: 8 mg/dL (ref 6–20)
CALCIUM: 9.7 mg/dL (ref 8.9–10.3)
CO2: 23 mmol/L (ref 22–32)
CREATININE: 0.91 mg/dL (ref 0.44–1.00)
Chloride: 110 mmol/L (ref 101–111)
GFR calc non Af Amer: 54 mL/min — ABNORMAL LOW (ref >60)
GLUCOSE: 101 mg/dL — AB (ref 65–99)
Potassium: 4.2 mmol/L (ref 3.5–5.1)
Sodium: 140 mmol/L (ref 135–145)

## 2015-09-17 LAB — LACTIC ACID, PLASMA: Lactic Acid, Venous: 0.9 mmol/L (ref 0.5–2.0)

## 2015-09-17 LAB — CBC
HCT: 27.8 % — ABNORMAL LOW (ref 36.0–46.0)
Hemoglobin: 8.7 g/dL — ABNORMAL LOW (ref 12.0–15.0)
MCH: 22.5 pg — AB (ref 26.0–34.0)
MCHC: 31.3 g/dL (ref 30.0–36.0)
MCV: 71.8 fL — AB (ref 78.0–100.0)
PLATELETS: 164 10*3/uL (ref 150–400)
RBC: 3.87 MIL/uL (ref 3.87–5.11)
RDW: 16.2 % — AB (ref 11.5–15.5)
WBC: 4.7 10*3/uL (ref 4.0–10.5)

## 2015-09-17 LAB — TROPONIN I: Troponin I: <0.03 ng/mL (ref <0.031)

## 2015-09-17 MED ORDER — NAPHAZOLINE HCL 0.1 % OP SOLN
1.0000 [drp] | Freq: Four times a day (QID) | OPHTHALMIC | Status: DC | PRN
  Filled 2015-09-17 (×2): qty 15

## 2015-09-17 MED ORDER — TRAMADOL HCL 50 MG PO TABS
50.0000 mg | ORAL_TABLET | Freq: Three times a day (TID) | ORAL | Status: DC | PRN
  Administered 2015-09-17 – 2015-09-18 (×2): 50 mg via ORAL
  Filled 2015-09-17 (×2): qty 1

## 2015-09-17 NOTE — Progress Notes (Signed)
Pt complaining of new onset blurred vision and itching eyes. Upon assessment, PERRLA, pt able to see number of fingers held up by RN, denies headache. MD notified, states she will come to assess the patient.

## 2015-09-17 NOTE — Progress Notes (Signed)
Family Medicine Teaching Service Daily Progress Note Intern Pager: [phone]  [name] Medical record number: [id number] Date of birth: 1924-03-14 Age: 81 y.o. Gender: female  Primary Care Provider: Kathrine Cords, MD Consultants: None Code Status: DNR  Pt Overview and Major Events to Date:   Assessment and Plan: [name] is a 81 y.o. female presenting with rigors. PMH is significant for T2DM, HTN, HLD, Anemia, HFpEF, GERD, asthma, hx of colon and breast cancer, and h/o MI.  # Urosepsis: Patient remains afebrile, no longer tachy,  RR 14-15 overnight, WBC remains wnl, not meeting SIRS criterial. UA was positive leukocytes, nitrates, and many bacteria. Patient with several episodes of emesis including 3 x times while in the room with patient. Lactic acid 2.03>1.5>0.9. ABX negative for small bowel obstruction. CXR was negative for any acute signs of infection. Procalcitonin <0.10 - Urine cultures and blood cultures no growth  - 100 ml @ NS  - Will start clear liquid diet - Troponin negative x 3  - Will continue Ceftriaxone - Monitor vitals every 6 hours  - CBC and BMP  # Hx of Seizures: Per Patient had seizures 60 years ago. Patient had witness generalized shaking on three different occasions prior to presentation and in the waiting room in ED. Did not having any tongue biting or post ictal state. Urinary incontinence at baseline and did not have any bowel incontinence. Episode reported to last 5 seconds with eyes rolling in the back of her head and not able to communicate. Likely rigors in setting of infection. Distant history of taking Dilantin. She is unsure of the last time she has seen a neurologist. CT head was negative for any acute process  - Would consider stopping tramadol   #Emesis: most likely related to above problem. KUB not revealing for any obstruction. Has been having constipation and having to use laxative to have BM. Reported last BM yesterday.  Abdominal exam is reassuring.  - zofran PRN IV or PO, did not use any overnight   # HTN: BP 152/46 - Hold Norvasc and Cozaar   # HFpEF: Last echo in March 2016 showed normal EF and grade 1 diastolic dysfunction. CXR showing no overload and no edema on exam  - Holding coreg  - Monitor volume status   #Microcytic Anemia: Hgb Basline 9-10.  history of iron deficiency anemia but more likely to be anemia of chronic disease. Hx of colon ca. Denis any melena or hematochezia.  - hgb 10.3> 8.7, trending down currently  - Iron 78/Ferritin 421 in 2015 - may benefit from iron therapy in setting of HF.  - Will continue to trend CBC   #DM2: A1c 6.7 this month - controlled with no medication   #CAD/PAD:stable - continue home ASA and crestor  #Hx of breast and colon cancer: continue tamoxifen until Jan '17, placed on tamoxifen initially Jan '12. Bilateral mastectomies and bilateral axillary lymph node dissection in 2001. On the right breast, showed a T1N0M0 left breast to have T1N1M0, both of them stained strongly for estrogen receptor positivity.   FEN/GI: PPI  Prophylaxis: Lovenox   Disposition: Telemetry   Subjective:  Patient doing well this morning. Patient was confused about admission. Patient wanting to go home today. However discussed plan to keep her for the next day.    Objective: Temp:  [98.2 F (36.8 C)-99.2 F (37.3 C)] 98.5 F (36.9 C) (11/04 0622) Pulse Rate:  [71-102] 71 (11/04 0622) Resp:  [14-27] 15 (11/04 0622)  BP: (130-161)/(39-84) 152/46 mmHg (11/04 0622) SpO2:  [95 %-100 %] 95 % (11/04 0622) Weight:  [106 lb 14.8 oz (48.5 kg)-120 lb (54.432 kg)] 106 lb 14.8 oz (48.5 kg) (11/04 8182) Physical Exam: General: Patient lying in bed in NAD  Cardiovascular: RRR, no murmurs, rubs, gallops Respiratory: CTAB, no wheezes or rhonchi  Abdomen: BS+, no ttp, no rebound, abdomen soft Extremities: No lower extremity edema,   Laboratory:  Recent Labs Lab  09/16/15 1412 09/17/15 0242  WBC 4.5 4.7  HGB 10.3* 8.7*  HCT 32.7* 27.8*  PLT 190 164    Recent Labs Lab 09/16/15 1412 09/17/15 0242  NA 137 140  K 3.7 4.2  CL 104 110  CO2 24 23  BUN 12 8  CREATININE 1.13* 0.91  CALCIUM 10.9* 9.7  PROT 7.4  --   BILITOT 0.5  --   ALKPHOS 40  --   ALT 8*  --   AST 17  --   GLUCOSE 174* 101*    Imaging/Diagnostic Tests:  Dg Chest 2 View  09/08/2015  CLINICAL DATA:  Productive cough with right-sided chest pain. History of diabetes, hypertension and congestive heart failure. Initial encounter. EXAM: CHEST  2 VIEW COMPARISON:  04/07/2015 and 04/06/2015. FINDINGS: There is interval improved aeration of the lung bases which are now nearly clear. There is minimal left basilar atelectasis or scarring. The heart size and mediastinal contours are stable. There is no pleural effusion or pneumothorax. The bones appear unchanged. IMPRESSION: No active cardiopulmonary process. Interval improved aeration of the lung bases. Electronically Signed   By: Richardean Sale M.D.   On: 09/08/2015 16:33   Dg Abd 1 View  09/16/2015  CLINICAL DATA:  81 year old female with abdominal pain and nausea and vomiting today. EXAM: ABDOMEN - 1 VIEW COMPARISON:  01/25/2014 CT. FINDINGS: Mild gaseous distension of the stomach noted. The bowel gas pattern is otherwise unremarkable. No dilated small bowel loops or colon noted. Surgical sutures in the region of the rectum noted. No acute bony abnormalities present. No suspicious calcifications are identified. IMPRESSION: Mild nonspecific gaseous distension of the stomach. Bowel gas pattern otherwise unremarkable. Electronically Signed   By: Margarette Canada M.D.   On: 09/16/2015 19:12   Ct Head Wo Contrast  09/16/2015  CLINICAL DATA:  Tremors, n/v this morning when waking up. Pt had a period of unresponsiveness and shaking while in waiting room. Pt denies any complaints. Pt states she has had the flu. EXAM: CT HEAD WITHOUT CONTRAST  TECHNIQUE: Contiguous axial images were obtained from the base of the skull through the vertex without intravenous contrast. COMPARISON:  01/25/2015 head CT and brain MR. FINDINGS: Sinuses/Soft tissues: Fluid in the right maxillary sinus. Partial opacification of ethmoid air cells. Clear mastoid air cells. Intracranial: Expected cerebral atrophy for age. Moderate low density in the periventricular white matter likely related to small vessel disease. Cerebellar atrophy is also expected for age. No mass lesion, hemorrhage, hydrocephalus, acute infarct, intra-axial, or extra-axial fluid collection. IMPRESSION: 1.  No acute intracranial abnormality. 2. Small vessel ischemic change. Expected for age cerebral and cerebellar atrophy. 3. Sinus disease Electronically Signed   By: Abigail Miyamoto M.D.   On: 09/16/2015 19:00   Dg Chest Portable 1 View  09/16/2015  CLINICAL DATA:  Vomiting EXAM: PORTABLE CHEST 1 VIEW COMPARISON:  09/08/2015 FINDINGS: The heart size and mediastinal contours are within normal limits. Both lungs are clear. The visualized skeletal structures are unremarkable. IMPRESSION: No active disease. Electronically Signed   By:  Franchot Gallo M.D.   On: 09/16/2015 15:57     Roma Bondar Cletis Media, MD 09/17/2015, 7:28 AM PGY-1, [place] Intern pager: [phone], text pages welcome

## 2015-09-17 NOTE — Care Management Note (Signed)
Case Management Note  Patient Details  Name: [name] MRN: [id number] Date of Birth: [birth year]  Subjective/Objective:   Date: 09/17/15 Spoke with patient at the bedside. Introduced self as Tourist information centre manager and explained role in discharge planning and how to be reached. Verified patient lives in town,  with daughter. Has DME a cane, a rolling walker and a bsc. Expressed potential need for no other DME.  Verified patient anticipates to go home with family, at time of discharge and will have part-time supervision by family  at this time to best of their knowledge. Patient denied needing help with their medication. Patient  is driven by son to MD appointments. Verified patient has PCP Kathrine Cords.  Plan: CM will continue to follow for discharge planning and [facility] resources.                  Action/Plan:   Expected Discharge Date:                  Expected Discharge Plan:  [place]  In-House Referral:     Discharge planning Services  CM Consult  Post Acute Care Choice:    Choice offered to:     DME Arranged:    DME Agency:     HH Arranged:    [place] Agency:     Status of Service:  In process, will continue to follow  Medicare Important Message Given:    Date Medicare IM Given:    Medicare IM give by:    Date Additional Medicare IM Given:    Additional Medicare Important Message give by:     If discussed at [place] of Stay Meetings, dates discussed:    Additional Comments:  Zenon Mayo, RN 09/17/2015, 4:35 PM

## 2015-09-18 DIAGNOSIS — N12 Tubulo-interstitial nephritis, not specified as acute or chronic: Secondary | ICD-10-CM

## 2015-09-18 DIAGNOSIS — I5030 Unspecified diastolic (congestive) heart failure: Secondary | ICD-10-CM

## 2015-09-18 LAB — CBC
HEMATOCRIT: 29 % — AB (ref 36.0–46.0)
Hemoglobin: 9.1 g/dL — ABNORMAL LOW (ref 12.0–15.0)
MCH: 22.1 pg — ABNORMAL LOW (ref 26.0–34.0)
MCHC: 31.4 g/dL (ref 30.0–36.0)
MCV: 70.4 fL — AB (ref 78.0–100.0)
Platelets: 165 10*3/uL (ref 150–400)
RBC: 4.12 MIL/uL (ref 3.87–5.11)
RDW: 16.1 % — AB (ref 11.5–15.5)
WBC: 4.9 10*3/uL (ref 4.0–10.5)

## 2015-09-18 LAB — GLUCOSE, CAPILLARY: Glucose-Capillary: 187 mg/dL — ABNORMAL HIGH (ref 65–99)

## 2015-09-18 NOTE — Progress Notes (Signed)
Family Medicine Teaching Service Daily Progress Note Intern Pager: [phone]  [name] Medical record number: [id number] Date of birth: 07-20-1924 Age: 81 y.o. Gender: female  Primary Care Provider: Kathrine Cords, MD Consultants: None Code Status: DNR  Pt Overview and Major Events to Date:   Assessment and Plan: [name] is a 81 y.o. female presenting with rigors. PMH is significant for T2DM, HTN, HLD, Anemia, HFpEF, GERD, asthma, hx of colon and breast cancer, and h/o MI.  # Urosepsis: Patient remains afebrile, normal heart and respiratory rate, WBC remains wnl, not meeting SIRS criterial. UA was positive leukocytes, nitrates, and many bacteria. On admission several episdes  Lactic acid 2.03>1.5>0.9. ABX negative for small bowel obstruction. CXR was negative for any acute signs of infection. Procalcitonin <0.10 - Urine cultures positive for 100, 000 gram negative rods , BCx no growth x 24 hours  - stop fluids,  patient tolerated clears, no regular diet   - Consider Transition to PO antibiotics from CFT today  - Monitor vitals every 6 hours  - CBC and BMP - zofran PRN IV or PO, no longer requiring   # Hx of Seizures: Per Patient had seizures 60 years ago. Patient had witness generalized shaking on three different occasions prior to presentation and in the waiting room in ED. Did not having any tongue biting or post ictal state. Urinary incontinence at baseline and did not have any bowel incontinence. Episode reported to last 5 seconds with eyes rolling in the back of her head and not able to communicate. Likely rigors in setting of infection. Distant history of taking Dilantin. CT head was negative for any acute process  - Will continue to monitor post-infectious process .   # HTN: BP 152/46 - Will restart Cozaar,  - Hold Norvas  # HFpEF: Euvolemic on exam. Last echo in March 2016 showed normal EF and grade 1 diastolic dysfunction. CXR showing no overload and  no edema on exam  - Will restart Coreg  - Monitor volume status   #Microcytic Anemia: Hgb Basline 9-10.  history of iron deficiency anemia but more likely to be anemia of chronic disease. Hx of colon ca. Denis any melena or hematochezia.  - hgb 10.3> 8.7> 9.1,  - Iron 78/Ferritin 421 in 2015 - may benefit from iron therapy in setting of HF.  - Will continue to trend CBC   #DM2: A1c 6.7 this month - controlled with no medication   #CAD/PAD:stable - continue home ASA and crestor  #Hx of breast and colon cancer: continue tamoxifen until Jan '17, placed on tamoxifen initially Jan '12. Bilateral mastectomies and bilateral axillary lymph node dissection in 2001. On the right breast, showed a T1N0M0 left breast to have T1N1M0, both of them stained strongly for estrogen receptor positivity.   FEN/GI: PPI  Prophylaxis: Lovenox   Disposition: Telemetry   Subjective:  Patient would like to go home today. Discussed that she is still currently on IV antibiotics. Patient did no sleep well while in the hospital last night.   Objective: Temp:  [98.4 F (36.9 C)-98.6 F (37 C)] 98.4 F (36.9 C) (11/05 0532) Pulse Rate:  [74-81] 74 (11/05 0532) Resp:  [16-18] 16 (11/05 0532) BP: (145-162)/(52-61) 155/61 mmHg (11/05 0532) SpO2:  [99 %-100 %] 100 % (11/05 0532) Weight:  [101 lb 13.6 oz (46.2 kg)-111 lb 8.8 oz (50.6 kg)] 101 lb 13.6 oz (46.2 kg) (11/05 0532) Physical Exam: General: Patient lying in bed in NAD  Cardiovascular: RRR, no murmurs, rubs, gallops Respiratory: CTAB, no wheezes or rhonchi  Abdomen: BS+, no ttp, no rebound, abdomen soft Extremities: No lower extremity edema,  Laboratory:  Recent Labs Lab 09/16/15 1412 09/17/15 0242 09/18/15 0502  WBC 4.5 4.7 4.9  HGB 10.3* 8.7* 9.1*  HCT 32.7* 27.8* 29.0*  PLT 190 164 165    Recent Labs Lab 09/16/15 1412 09/17/15 0242  NA 137 140  K 3.7 4.2  CL 104 110  CO2 24 23  BUN 12 8  CREATININE 1.13* 0.91  CALCIUM  10.9* 9.7  PROT 7.4  --   BILITOT 0.5  --   ALKPHOS 40  --   ALT 8*  --   AST 17  --   GLUCOSE 174* 101*    Imaging/Diagnostic Tests:  Dg Chest 2 View  09/08/2015  CLINICAL DATA:  Productive cough with right-sided chest pain. History of diabetes, hypertension and congestive heart failure. Initial encounter. EXAM: CHEST  2 VIEW COMPARISON:  04/07/2015 and 04/06/2015. FINDINGS: There is interval improved aeration of the lung bases which are now nearly clear. There is minimal left basilar atelectasis or scarring. The heart size and mediastinal contours are stable. There is no pleural effusion or pneumothorax. The bones appear unchanged. IMPRESSION: No active cardiopulmonary process. Interval improved aeration of the lung bases. Electronically Signed   By: Richardean Sale M.D.   On: 09/08/2015 16:33   Dg Abd 1 View  09/16/2015  CLINICAL DATA:  81 year old female with abdominal pain and nausea and vomiting today. EXAM: ABDOMEN - 1 VIEW COMPARISON:  01/25/2014 CT. FINDINGS: Mild gaseous distension of the stomach noted. The bowel gas pattern is otherwise unremarkable. No dilated small bowel loops or colon noted. Surgical sutures in the region of the rectum noted. No acute bony abnormalities present. No suspicious calcifications are identified. IMPRESSION: Mild nonspecific gaseous distension of the stomach. Bowel gas pattern otherwise unremarkable. Electronically Signed   By: Margarette Canada M.D.   On: 09/16/2015 19:12   Ct Head Wo Contrast  09/16/2015  CLINICAL DATA:  Tremors, n/v this morning when waking up. Pt had a period of unresponsiveness and shaking while in waiting room. Pt denies any complaints. Pt states she has had the flu. EXAM: CT HEAD WITHOUT CONTRAST TECHNIQUE: Contiguous axial images were obtained from the base of the skull through the vertex without intravenous contrast. COMPARISON:  01/25/2015 head CT and brain MR. FINDINGS: Sinuses/Soft tissues: Fluid in the right maxillary sinus. Partial  opacification of ethmoid air cells. Clear mastoid air cells. Intracranial: Expected cerebral atrophy for age. Moderate low density in the periventricular white matter likely related to small vessel disease. Cerebellar atrophy is also expected for age. No mass lesion, hemorrhage, hydrocephalus, acute infarct, intra-axial, or extra-axial fluid collection. IMPRESSION: 1.  No acute intracranial abnormality. 2. Small vessel ischemic change. Expected for age cerebral and cerebellar atrophy. 3. Sinus disease Electronically Signed   By: Abigail Miyamoto M.D.   On: 09/16/2015 19:00   Dg Chest Portable 1 View  09/16/2015  CLINICAL DATA:  Vomiting EXAM: PORTABLE CHEST 1 VIEW COMPARISON:  09/08/2015 FINDINGS: The heart size and mediastinal contours are within normal limits. Both lungs are clear. The visualized skeletal structures are unremarkable. IMPRESSION: No active disease. Electronically Signed   By: Franchot Gallo M.D.   On: 09/16/2015 15:57     Beth Goodlin Cletis Media, MD 09/18/2015, 8:31 AM PGY-1, [place] Intern pager: [phone], text pages welcome

## 2015-09-18 NOTE — Progress Notes (Signed)
Physical Therapy Evaluation Patient Details Name: [name] MRN: [id number] DOB: [birth year] Today's Date: 09/18/2015   History of Present Illness  81 y.o. female, dx of sepsis due to pyelonephritis. PMH is significant for T2DM, HTN, HLD, Anemia, HFpEF, GERD, asthma, hx of colon and breast cancer, and h/o MI.  Clinical Impression  Patient reports she is feeling better today.  Generalized weakness and neuropathy pain in feet (when standing) is major complaint.  MIN assist overall for bed mobility and transfers, Gait x 50 feet with MIN guard and RW.  Patient will benefit from continuation of skilled PT services while in hospital for strengthening, mobility, transfers, and gait including stairs, but is appropriate for return to prior home when medically appropriate, can continue physical recovery with Home Health assist.    Follow Up Recommendations Home health PT (Patient reports she was working with [facility] prior to hosp.)    Equipment Recommendations  None recommended by PT    Recommendations for Other Services       Precautions / Restrictions Precautions Precautions: Fall Restrictions Weight Bearing Restrictions: No      Mobility  Bed Mobility Overal bed mobility: Needs Assistance Bed Mobility: Supine to Sit     Supine to sit: Min assist     General bed mobility comments: Slow process  Transfers Overall transfer level: Needs assistance Equipment used: Rolling walker (2 wheeled) Transfers: Sit to/from Stand Sit to Stand: Min assist         General transfer comment: Pain in feet on standing, cues for hand placement during transfer.  Ambulation/Gait Ambulation/Gait assistance: Min guard Ambulation Distance (Feet): 50 Feet Assistive device: Rolling walker (2 wheeled) Gait Pattern/deviations: Decreased stride length   Gait velocity interpretation: Below normal speed for age/gender    Stairs            Wheelchair Mobility    Modified Rankin (Stroke  Patients Only)       Balance Overall balance assessment: Needs assistance Sitting-balance support: Single extremity supported Sitting balance-Leahy Scale: Fair       Standing balance-Leahy Scale: Fair                               Pertinent Vitals/Pain Pain Assessment: No/denies pain (Diabetic neuropathy in feet, pain during walking.)    Home Living Family/patient expects to be discharged to:: Private residence Living Arrangements: Children (son and daughter live with her.) Available Help at Discharge: Family;Available PRN/intermittently Type of Home: House Home Access: Stairs to enter Entrance Stairs-Rails: Right Entrance Stairs-Number of Steps: 3 Home Layout: One level Home Equipment: Walker - 2 wheels;Walker - 4 wheels;Cane - single point;Bedside commode;Shower seat Additional Comments: Patient reports her son has hx of aneurysm in brain, and daughter has 'lung problems'.     Prior Function Level of Independence: Needs assistance   Gait / Transfers Assistance Needed: Pt requires S for gait in house with use of RW or cane  ADL's / Homemaking Assistance Needed: Pt has aide who comes in and helps 3hrs per day mon-friday; Son and family help with homemaking and meals   Comments: Patient did laundry and most of cooking.     Hand Dominance        Extremity/Trunk Assessment   Upper Extremity Assessment: Overall WFL for tasks assessed           Lower Extremity Assessment: Overall WFL for tasks assessed         Communication  Communication: No difficulties  Cognition Arousal/Alertness: Awake/alert Behavior During Therapy: WFL for tasks assessed/performed Overall Cognitive Status: Within Functional Limits for tasks assessed                      General Comments General comments (skin integrity, edema, etc.): Good safety awareness generally, low activity tolerance.    Exercises General Exercises - Lower Extremity Ankle Circles/Pumps:  AROM;Both;10 reps;Supine;Seated (for neuropathy pain) Heel Slides: AROM;Both;10 reps;Supine (for neuropathy pain)      Assessment/Plan    PT Assessment Patient needs continued PT services  PT Diagnosis Generalized weakness   PT Problem List Decreased strength;Decreased activity tolerance;Decreased mobility;Impaired sensation;Pain  PT Treatment Interventions Gait training;Stair training;Functional mobility training;Therapeutic activities;Therapeutic exercise;Patient/family education   PT Goals (Current goals can be found in the Care Plan section) Acute Rehab PT Goals Patient Stated Goal: To return home again. PT Goal Formulation: With patient Time For Goal Achievement: 10/02/15 Potential to Achieve Goals: Good    Frequency Min 3X/week   Barriers to discharge        Co-evaluation               End of Session Equipment Utilized During Treatment: Gait belt Activity Tolerance: Patient tolerated treatment well;Patient limited by fatigue Patient left: in bed;with call bell/phone within reach Nurse Communication: Mobility status         Time: 1400-1425 PT Time Calculation (min) (ACUTE ONLY): 25 min   Charges:   PT Evaluation $Initial PT Evaluation Tier I: 1 Procedure PT Treatments $Gait Training: 8-22 mins   PT G CodesZenia Resides, Iyonnah Ferrante L September 21, 2015, 2:41 PM

## 2015-09-19 ENCOUNTER — Other Ambulatory Visit: Payer: Self-pay | Admitting: Family Medicine

## 2015-09-19 LAB — URINE CULTURE: Special Requests: NORMAL

## 2015-09-19 MED ORDER — CLOPIDOGREL BISULFATE 75 MG PO TABS
75.0000 mg | ORAL_TABLET | Freq: Every day | ORAL | Status: DC
  Administered 2015-09-19: 75 mg via ORAL
  Filled 2015-09-19: qty 1

## 2015-09-19 MED ORDER — CARVEDILOL 12.5 MG PO TABS: 12.5000 mg | ORAL_TABLET | Freq: Two times a day (BID) | ORAL | Status: DC

## 2015-09-19 MED ORDER — ASPIRIN 81 MG PO CHEW
81.0000 mg | CHEWABLE_TABLET | Freq: Every day | ORAL | Status: DC
  Administered 2015-09-19: 81 mg via ORAL
  Filled 2015-09-19: qty 1

## 2015-09-19 MED ORDER — LOSARTAN POTASSIUM 50 MG PO TABS
50.0000 mg | ORAL_TABLET | Freq: Every day | ORAL | Status: DC
  Administered 2015-09-19: 50 mg via ORAL
  Filled 2015-09-19: qty 1

## 2015-09-19 MED ORDER — CEPHALEXIN 500 MG PO CAPS: 500.0000 mg | ORAL_CAPSULE | Freq: Four times a day (QID) | ORAL | Status: AC

## 2015-09-19 NOTE — Progress Notes (Signed)
CM spoke with daughter [name] (on phone) while in pt's room and confirmed [facility] is choice for [facility]PT.  Referral called to [facility] rep, Amy.  No other CM needs were communicated.

## 2015-09-19 NOTE — Care Management Important Message (Signed)
Important Message  Patient Details  Name: [name] MRN: [id number] Date of Birth: [birth year]   Medicare Important Message Given:  Yes-second notification given    Nathen May 09/19/2015, 10:18 AM

## 2015-09-19 NOTE — Discharge Summary (Signed)
[facility] Discharge Summary  [name] Medical record number: [id number] Date of birth: 05-05-1924 Age: 81 y.o. Gender: female Date of Admission: 09/16/2015  Date of Discharge: 09/19/2015 Admitting Physician: Leeanne Rio, MD  Primary Care Provider: Kathrine Cords, MD Consultants: None   Indication for Hospitalization: Urosepsis   Discharge Diagnoses/Problem List:  Patient Active Problem List   Diagnosis Date Noted  . Pyelonephritis 09/16/2015  . (HFpEF) heart failure with preserved ejection fraction ([place])   . CAD in native artery   . Emesis, persistent   . UTI (urinary tract infection) 04/15/2015  . Tobacco abuse 06/02/2011  . History of breast cancer in female, bilateral.  Mastectomies 10/24/2010. 04/26/2011  . ANEMIA-IRON DEFICIENCY 04/29/2007  . HLD (hyperlipidemia) 01/29/2007  . Diabetes type 2, controlled ([place]) 01/10/2007  . HYPERTENSION, BENIGN SYSTEMIC 01/10/2007   Disposition: Home   Discharge Condition: Stable   Discharge Exam:  General: Patient lying in bed in NAD Cardiovascular: RRR, no murmurs, rubs, gallops Respiratory: CTAB, no wheezes or rhonchi  Abdomen: BS+, no ttp, no rebound, abdomen soft Extremities: No lower extremity edema  Brief Hospital Course:  Patient was admitted for urosepsis with vomiting, fever, and rigors in setting of recent flulike illness, with urinalysis suggestive of UTI. Systemic symptoms were concerning  for pyelonephritis. Patient met sepsis criteria with source, tachycardia, and mild tachypnea. UA was positive leukocytes, nitrates, and many bacteria. Patient had lactic acid 2.03 which trended to 0.9. CXR was negative for any acute signs of infection. Procalcitonin <0.10 Patient's urine cultures was found to be positive for 100, 000 E.coli. Blood cultures were negative for growth. Patient was treated with IV antibiotics and then transitioned to PO antibiotics after 48 hours.    Issues for Follow Up:  1. Please follow up on patient's Keflex, end date on 09/25/2015  2. Please follow up on patient's home health physical therapy 3. Please follow up on patient's anemia, consider adding iron supplementation  Significant Procedures: None   Significant Labs and Imaging:   Recent Labs Lab 09/16/15 1412 09/17/15 0242 09/18/15 0502  WBC 4.5 4.7 4.9  HGB 10.3* 8.7* 9.1*  HCT 32.7* 27.8* 29.0*  PLT 190 164 165    Recent Labs Lab 09/16/15 1412 09/17/15 0242  NA 137 140  K 3.7 4.2  CL 104 110  CO2 24 23  GLUCOSE 174* 101*  BUN 12 8  CREATININE 1.13* 0.91  CALCIUM 10.9* 9.7  ALKPHOS 40  --   AST 17  --   ALT 8*  --   ALBUMIN 3.2*  --     Results/Tests Pending at Time of Discharge: None   Discharge Medications:    Medication List    STOP taking these medications        amLODipine 5 MG tablet  Commonly known as:  NORVASC     gabapentin 300 MG capsule  Commonly known as:  NEURONTIN     losartan 50 MG tablet  Commonly known as:  COZAAR     oseltamivir 30 MG capsule  Commonly known as:  TAMIFLU      TAKE these medications        acetaminophen 500 MG tablet  Commonly known as:  TYLENOL  Take 500 mg by mouth every 6 (six) hours as needed for moderate pain (pain).     albuterol 108 (90 BASE) MCG/ACT inhaler  Commonly known as:  PROVENTIL HFA;VENTOLIN HFA  Inhale 2 puffs into the lungs every 6 (six)  hours as needed. For shortness of breath     ASPIRIN CHILDRENS 81 MG chewable tablet  Generic drug:  aspirin  Chew 81 mg by mouth daily.     Blood Pressure Kit  Use the blood pressure kit to check you blood pressure twice a day     carvedilol 12.5 MG tablet  Commonly known as:  COREG  Take 1 tablet (12.5 mg total) by mouth 2 (two) times daily with a meal.     cephALEXin 500 MG capsule  Commonly known as:  KEFLEX  Take 1 capsule (500 mg total) by mouth 4 (four) times daily.     clopidogrel 75 MG tablet  Commonly known as:  PLAVIX   TAKE 1 TABLET (75 MG TOTAL) BY MOUTH DAILY.     colchicine 0.6 MG tablet  Take 2 tabs by mouth for gout, then 1 tab in one hour. Do not take more for 3 days.     Dextromethorphan HBr 15 MG Caps  Take 2 capsules (30 mg total) by mouth every 6 (six) hours as needed.     glucose blood test strip  Use as instructed Dx. Code 250.02     MILK OF MAGNESIA 400 MG/5ML suspension  Generic drug:  magnesium hydroxide  Take 30 mLs by mouth daily as needed for mild constipation.     ranitidine 150 MG capsule  Commonly known as:  ZANTAC  TAKE ONE CAPSULE BY MOUTH TWICE A DAY AS NEEDED FOR HEARTBURN     rosuvastatin 20 MG tablet  Commonly known as:  CRESTOR  Take 1 tablet (20 mg total) by mouth at bedtime.     tamoxifen 20 MG tablet  Commonly known as:  NOLVADEX  TAKE 1 TABLET (20 MG TOTAL) BY MOUTH DAILY.     traMADol 50 MG tablet  Commonly known as:  ULTRAM  Take 1 tablet (50 mg total) by mouth every 8 (eight) hours as needed for severe pain.     VOLTAREN 1 % Gel  Generic drug:  diclofenac sodium  APPLY 2 GRAMS TOPICALLY 4 (FOUR) TIMES DAILY AS NEEDED FOR PAIN     diclofenac sodium 1 % Gel  Commonly known as:  VOLTAREN  APPLY 2 GRAMS TOPICALLY 4 TIMES DAILY AS NEEDED FOR PAIN        Discharge Instructions: Please refer to Patient Instructions section of EMR for full details.  Patient was counseled important signs and symptoms that should prompt return to medical care, changes in medications, dietary instructions, activity restrictions, and follow up appointments.   Follow-Up Appointments: Follow-up Information    Follow up with Kathrine Cords, MD On 10/01/2015.   Specialty:  Family Medicine   Why:  appointment at 3pm   Contact information:   [address] [place] [address] [phone]       Azuree Minish Cletis Media, MD 09/20/2015, 7:51 PM PGY-1, [place]

## 2015-09-19 NOTE — Discharge Instructions (Signed)

## 2015-09-19 NOTE — Progress Notes (Signed)
Patient with short term memory deficits. Patient frequently called for assistance, forgetting staff came to room and assisted her. Nurse Silvana Newness) assisted patient to call daughter at home. Daughter came back to hospital after previous visit. Nurse explained to daughter that patient forgets engaging with staff. Patient immediately experienced a productive cough followed by a small amount of emesis (clear/white/red food). Patient was given Zofran IV 4mg . Patient was reassessed after 45 minutes and then given Tramadol 50 mg per patient request. adm

## 2015-09-19 NOTE — Progress Notes (Signed)
Family Medicine Teaching Service Daily Progress Note Intern Pager: [phone]  [name] Medical record number: [id number] Date of birth: 1924-02-15 Age: 81 y.o. Gender: female  Primary Care Provider: Kathrine Cords, MD Consultants: None Code Status: DNR  Pt Overview and Major Events to Date:   Assessment and Plan: [name] is a 81 y.o. female presenting with rigors. PMH is significant for T2DM, HTN, HLD, Anemia, HFpEF, GERD, asthma, hx of colon and breast cancer, and h/o MI.  # Urosepsis: Patient remains afebrile, normal heart and respiratory rate, WBC remains wnl, not meeting SIRS criterial. UA was positive leukocytes, nitrates, and many bacteria. On admission several episdes  Lactic acid 2.03>1.5>0.9. ABX negative for small bowel obstruction. CXR was negative for any acute signs of infection. Procalcitonin <0.10 - Urine cultures positive for 100, 000 EColi - pansensitive - BCx no growth - Transition to Keflex for completion of 10 day course  # Hx of Seizures: Per Patient had seizures 60 years ago. Patient had witness generalized shaking on three different occasions prior to presentation and in the waiting room in ED. Did not having any tongue biting or post ictal state. Urinary incontinence at baseline and did not have any bowel incontinence. Episode reported to last 5 seconds with eyes rolling in the back of her head and not able to communicate. Likely rigors in setting of infection. Distant history of taking Dilantin. CT head was negative for any acute process  - Will continue to monitor post-infectious process .   # HTN: BP 325Q-982M systolic - Will restart Cozaar,  - Hold Norvasc currently  # HFpEF: Euvolemic on exam. Last echo in March 2016 showed normal EF and grade 1 diastolic dysfunction. CXR showing no overload and no edema on exam  - Will restart Coreg  - Monitor volume status   #Microcytic Anemia: Hgb Basline 9-10.  history of iron deficiency  anemia but more likely to be anemia of chronic disease. Hx of colon ca. Denis any melena or hematochezia.  - hgb 10.3> 8.7> 9.1,  - Iron 78/Ferritin 421 in 2015 - may benefit from iron therapy in setting of HF.  - Will continue to trend CBC   #DM2: A1c 6.7 this month - controlled with no medication   #CAD/PAD:stable - continue home ASA and crestor  #Hx of breast and colon cancer: continue tamoxifen until Jan '17, placed on tamoxifen initially Jan '12. Bilateral mastectomies and bilateral axillary lymph node dissection in 2001. On the right breast, showed a T1N0M0 left breast to have T1N1M0, both of them stained strongly for estrogen receptor positivity.   FEN/GI: PPI, Soft diet  Prophylaxis: Lovenox   Disposition: [place] d/c later today  Subjective:  Patient would like to go home today. Denies any pain  Objective: Temp:  [99.5 F (37.5 C)-100.4 F (38 C)] 100.4 F (38 C) (11/06 0553) Pulse Rate:  [81-104] 81 (11/06 0553) Resp:  [18-24] 18 (11/06 0553) BP: (136-151)/(42-61) 142/42 mmHg (11/06 0553) SpO2:  [98 %-99 %] 98 % (11/06 0553) Weight:  [111 lb 8.8 oz (50.6 kg)] 111 lb 8.8 oz (50.6 kg) (11/06 0500) Physical Exam: General: Patient lying in bed in NAD Cardiovascular: RRR, no murmurs, rubs, gallops Respiratory: CTAB, no wheezes or rhonchi  Abdomen: BS+, no ttp, no rebound, abdomen soft Extremities: No lower extremity edema  Laboratory:  Recent Labs Lab 09/16/15 1412 09/17/15 0242 09/18/15 0502  WBC 4.5 4.7 4.9  HGB 10.3* 8.7* 9.1*  HCT 32.7* 27.8* 29.0*  PLT  190 164 165    Recent Labs Lab 09/16/15 1412 09/17/15 0242  NA 137 140  K 3.7 4.2  CL 104 110  CO2 24 23  BUN 12 8  CREATININE 1.13* 0.91  CALCIUM 10.9* 9.7  PROT 7.4  --   BILITOT 0.5  --   ALKPHOS 40  --   ALT 8*  --   AST 17  --   GLUCOSE 174* 101*    Imaging/Diagnostic Tests:  Dg Chest 2 View  09/08/2015  CLINICAL DATA:  Productive cough with right-sided chest pain. History of  diabetes, hypertension and congestive heart failure. Initial encounter. EXAM: CHEST  2 VIEW COMPARISON:  04/07/2015 and 04/06/2015. FINDINGS: There is interval improved aeration of the lung bases which are now nearly clear. There is minimal left basilar atelectasis or scarring. The heart size and mediastinal contours are stable. There is no pleural effusion or pneumothorax. The bones appear unchanged. IMPRESSION: No active cardiopulmonary process. Interval improved aeration of the lung bases. Electronically Signed   By: Richardean Sale M.D.   On: 09/08/2015 16:33   Dg Abd 1 View  09/16/2015  CLINICAL DATA:  81 year old female with abdominal pain and nausea and vomiting today. EXAM: ABDOMEN - 1 VIEW COMPARISON:  01/25/2014 CT. FINDINGS: Mild gaseous distension of the stomach noted. The bowel gas pattern is otherwise unremarkable. No dilated small bowel loops or colon noted. Surgical sutures in the region of the rectum noted. No acute bony abnormalities present. No suspicious calcifications are identified. IMPRESSION: Mild nonspecific gaseous distension of the stomach. Bowel gas pattern otherwise unremarkable. Electronically Signed   By: Margarette Canada M.D.   On: 09/16/2015 19:12   Ct Head Wo Contrast  09/16/2015  CLINICAL DATA:  Tremors, n/v this morning when waking up. Pt had a period of unresponsiveness and shaking while in waiting room. Pt denies any complaints. Pt states she has had the flu. EXAM: CT HEAD WITHOUT CONTRAST TECHNIQUE: Contiguous axial images were obtained from the base of the skull through the vertex without intravenous contrast. COMPARISON:  01/25/2015 head CT and brain MR. FINDINGS: Sinuses/Soft tissues: Fluid in the right maxillary sinus. Partial opacification of ethmoid air cells. Clear mastoid air cells. Intracranial: Expected cerebral atrophy for age. Moderate low density in the periventricular white matter likely related to small vessel disease. Cerebellar atrophy is also expected for  age. No mass lesion, hemorrhage, hydrocephalus, acute infarct, intra-axial, or extra-axial fluid collection. IMPRESSION: 1.  No acute intracranial abnormality. 2. Small vessel ischemic change. Expected for age cerebral and cerebellar atrophy. 3. Sinus disease Electronically Signed   By: Abigail Miyamoto M.D.   On: 09/16/2015 19:00   Dg Chest Portable 1 View  09/16/2015  CLINICAL DATA:  Vomiting EXAM: PORTABLE CHEST 1 VIEW COMPARISON:  09/08/2015 FINDINGS: The heart size and mediastinal contours are within normal limits. Both lungs are clear. The visualized skeletal structures are unremarkable. IMPRESSION: No active disease. Electronically Signed   By: Franchot Gallo M.D.   On: 09/16/2015 15:57     Virginia Crews, MD 09/19/2015, 8:56 AM PGY-2, [place] Intern pager: [phone], text pages welcome

## 2015-09-19 NOTE — Progress Notes (Addendum)
11/06 16 patient to be discharged home today with daughter, have made several calls to reach her with no answer, IV site removed, and discharge packet ready for discharge.Family contacted and will take home this afternoon.

## 2015-09-20 ENCOUNTER — Telehealth: Payer: Self-pay | Admitting: Family Medicine

## 2015-09-20 NOTE — Telephone Encounter (Addendum)
Paged to [facility] Emergency Line by daughter Mauria Asquith) of patient [name], recently hospitalized at [facility] from 11/3 to 11/6 for urosepsis. She was treated for pyelonephritis and discharged with improvement to complete a Keflex QID PO course last dose 09/25/15. [name] was calling in reference to confirming her med rec on discharge as she was concerned about a few discrepancies. She was discharged with normal kidney function at SCr stable at baseline 0.91 with GFR >60 and cleared lactic acidosis. She was asking if she should continue giving the following medications: 1. Metformin 500mg  daily - Yes - I advised to continue her Metformin, monitor CBGs routinely 2. Amlodipine 5mg  - This was marked as HOLD on discharge. Review of her recent hospitalization with mildly elevated BP SBP 140-150s, this was held temporarily and Losartan 50mg  was restarted initially. I advised to continue to HOLD Amlodipine 5mg  at night at this time, and recommend that she start checking BP at home (tonight BP was 135/66, reassured that this was in normal range), she can check BP next few days and if BP < 150/90 then continue to hold for now and she can call back with BP readings later this week to check with PCP plans for resuming Amlodipine. Recommendations against Amlodipine would be to avoid hypotension in 81 year old patient.  Additionally, confirmed some other meds on her list. I reminded her to complete the Keflex PO QID with last dose Saturday 11/12. She missed one dose and gave later, this was fine. No further questions. Plan to follow-up with PCP as scheduled 11/18.  [name], [place], PGY-3

## 2015-09-21 LAB — CULTURE, BLOOD (ROUTINE X 2)
CULTURE: NO GROWTH
CULTURE: NO GROWTH

## 2015-09-22 ENCOUNTER — Other Ambulatory Visit: Payer: Self-pay | Admitting: Family Medicine

## 2015-09-22 ENCOUNTER — Telehealth: Payer: Self-pay | Admitting: Family Medicine

## 2015-09-22 DIAGNOSIS — M159 Polyosteoarthritis, unspecified: Secondary | ICD-10-CM | POA: Diagnosis not present

## 2015-09-22 DIAGNOSIS — N390 Urinary tract infection, site not specified: Secondary | ICD-10-CM | POA: Diagnosis not present

## 2015-09-22 DIAGNOSIS — B9629 Other Escherichia coli [E. coli] as the cause of diseases classified elsewhere: Secondary | ICD-10-CM | POA: Diagnosis not present

## 2015-09-22 DIAGNOSIS — E119 Type 2 diabetes mellitus without complications: Secondary | ICD-10-CM | POA: Diagnosis not present

## 2015-09-22 DIAGNOSIS — I5022 Chronic systolic (congestive) heart failure: Secondary | ICD-10-CM | POA: Diagnosis not present

## 2015-09-22 DIAGNOSIS — G609 Hereditary and idiopathic neuropathy, unspecified: Secondary | ICD-10-CM | POA: Diagnosis not present

## 2015-09-22 NOTE — Telephone Encounter (Signed)
[facility]:  Need progress showing the reason diabetic shoes are needed for the pt. Please fax  [phone]- [phone]

## 2015-09-22 NOTE — Telephone Encounter (Signed)
Patient has an appt with me in 2 weeks, we'll discuss her request for shoes at that time and proceed accordingly.  Thanks, [name], MD [facility] Family Medicine Resident  09/22/2015, 1:35 PM

## 2015-09-24 ENCOUNTER — Telehealth: Payer: Self-pay | Admitting: Family Medicine

## 2015-09-24 NOTE — Telephone Encounter (Signed)
Roland called and and would like verbal orders for the patient Home Health PT. He would like 2 times a week for 3 weeks. Please call him at [phone]. jw

## 2015-09-28 NOTE — Telephone Encounter (Signed)
Called and left message.  Kathrine Cords, MD

## 2015-10-01 ENCOUNTER — Ambulatory Visit (INDEPENDENT_AMBULATORY_CARE_PROVIDER_SITE_OTHER): Payer: Medicare Other | Admitting: Family Medicine

## 2015-10-01 ENCOUNTER — Other Ambulatory Visit: Payer: Self-pay | Admitting: Family Medicine

## 2015-10-01 ENCOUNTER — Encounter: Payer: Self-pay | Admitting: Family Medicine

## 2015-10-01 VITALS — BP 145/75 | HR 87 | Temp 99.0°F | Ht 59.0 in | Wt 120.0 lb

## 2015-10-01 DIAGNOSIS — M546 Pain in thoracic spine: Secondary | ICD-10-CM | POA: Diagnosis not present

## 2015-10-01 DIAGNOSIS — R0989 Other specified symptoms and signs involving the circulatory and respiratory systems: Secondary | ICD-10-CM | POA: Diagnosis not present

## 2015-10-01 DIAGNOSIS — R112 Nausea with vomiting, unspecified: Secondary | ICD-10-CM | POA: Diagnosis present

## 2015-10-01 MED ORDER — GUAIFENESIN 200 MG PO TABS: 200.0000 mg | ORAL_TABLET | Freq: Four times a day (QID) | ORAL | Status: DC | PRN

## 2015-10-01 MED ORDER — TRAMADOL HCL 50 MG PO TABS: 50.0000 mg | ORAL_TABLET | Freq: Three times a day (TID) | ORAL | Status: DC | PRN

## 2015-10-01 NOTE — Progress Notes (Signed)
Patient ID: [name], female   DOB: [birth year], 81 y.o.   MRN: [id number]    Subjective: CC: hospital follow up  HPI: Patient is a 81 y.o. female with a past medical history of HTN, DM, breast cancer, OA, among other things  presenting to clinic today for a hospital follow up   She was hospitalized at [facility] from 11/3 to 11/6 for urosepsis, treated for pyelonephritis and discharged with improvement to complete a Keflex QID PO course last dose 09/25/15. Patient stated she completed this course. No dysuria, urinary urgency, back pain, fevers.   Daughter is concerned the patient isn't eating well. She notes sometimes her mother starts to refuse meds and food. The patient chimes in and notes that she takes all her medicines and does "everything to keep herself healthy." She notes she hasn't been eating as much because of chest congestion. She notes she been having a lot of nasal congestion and sputum production as well. No foul taste. No acid reflux. No cough, fevers, watery eyes, or otalgias.  The patient notes she's been drinking 1-2 Ensures per day. No weight loss. No night sweats.   Social History: continues to do snuff.   ROS: All other systems reviewed and are negative.  Past Medical History Patient Active Problem List   Diagnosis Date Noted  . Pyelonephritis 09/16/2015  . (HFpEF) heart failure with preserved ejection fraction ([place])   . CAD in native artery   . Emesis, persistent   . Chest congestion 09/08/2015  . Gout 05/14/2015  . UTI (urinary tract infection) 04/15/2015  . Acute delirium 04/15/2015  . Fever 04/07/2015  . Hypertension 04/07/2015  . Dyslipidemia   . Essential hypertension   . Seasonal allergies 02/10/2015  . Nausea with vomiting 02/10/2015  . Flank pain 02/10/2015  . Stroke-like symptoms   . Stroke-like episode ([place]) 01/25/2015  . Syncope and collapse 01/25/2015  . Accelerated hypertension   . Hypertensive emergency 09/19/2014  . Anemia of chronic illness  08/19/2014  . Acute superficial venous thrombosis of right lower extremity 08/19/2014  . Hypercalcemia 06/09/2014  . Breast cancer, left breast ([place]) 06/09/2014  . Breast cancer, right breast ([place]) 06/09/2014  . Edema of left lower extremity 03/30/2014  . PAD (peripheral artery disease) ([place]) 03/30/2014  . Colon cancer ([place]) 12/11/2013  . Neck pain 10/22/2013  . History of fall 05/15/2013  . Memory deficit 05/15/2013  . Bilateral leg pain 03/07/2013  . Trigger thumb of right hand 03/07/2013  . Lower extremity edema 02/19/2013  . Stricture and stenosis of esophagus 10/27/2012  . Poor social situation 09/13/2012  . Low back pain 08/07/2012  . Musculoskeletal chest pain 07/10/2012  . Thoracic back pain 05/24/2012  . Weight loss 04/03/2012  . Insomnia 09/25/2011  . Tobacco abuse 06/02/2011  . History of breast cancer in female, bilateral.  Mastectomies 10/24/2010. 04/26/2011  . Hiatal hernia 03/22/2011  . Colon polyp 03/22/2011  . Poor circulation 03/22/2011  . 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 NEUROPATHY 08/25/2009  . ANEMIA-IRON DEFICIENCY 04/29/2007  . HLD (hyperlipidemia) 01/29/2007  . Diabetes type 2, controlled ([place]) 01/10/2007  . HYPERTENSION, BENIGN SYSTEMIC 01/10/2007  . Coronary atherosclerosis 01/10/2007  . Chronic systolic heart failure ([place]) 01/10/2007  . Asthma 01/10/2007  . Reflux esophagitis 01/10/2007  . Osteoarthritis, multiple sites 01/10/2007    Medications- reviewed and updated Current Outpatient Prescriptions  Medication Sig Dispense Refill  . acetaminophen (TYLENOL) 500 MG tablet Take 500 mg by mouth every 6 (six) hours as  needed for moderate pain (pain).    Marland Kitchen albuterol (PROVENTIL HFA;VENTOLIN HFA) 108 (90 BASE) MCG/ACT inhaler Inhale 2 puffs into the lungs every 6 (six) hours as needed. For shortness of breath 1 Inhaler 6  . aspirin (ASPIRIN CHILDRENS) 81 MG chewable tablet Chew 81 mg by mouth daily.      . Blood Pressure KIT Use the blood  pressure kit to check you blood pressure twice a day 1 each 0  . carvedilol (COREG) 12.5 MG tablet Take 1 tablet (12.5 mg total) by mouth 2 (two) times daily with a meal. 60 tablet 0  . clopidogrel (PLAVIX) 75 MG tablet TAKE 1 TABLET (75 MG TOTAL) BY MOUTH DAILY. 30 tablet 2  . colchicine 0.6 MG tablet Take 2 tabs by mouth for gout, then 1 tab in one hour. Do not take more for 3 days. (Patient not taking: Reported on 09/16/2015) 9 tablet 0  . Dextromethorphan HBr 15 MG CAPS Take 2 capsules (30 mg total) by mouth every 6 (six) hours as needed. (Patient not taking: Reported on 09/16/2015) 20 each 0  . diclofenac sodium (VOLTAREN) 1 % GEL APPLY 2 grams topically 4 TIMES DAILY AS NEEDED FOR PAIN 100 g 1  . gabapentin (NEURONTIN) 300 MG capsule TAKE ONE CAPSULE BY MOUTH TWICE A DAY 60 capsule 1  . glucose blood test strip Use as instructed Dx. Code 250.02 100 each 12  . guaiFENesin 200 MG tablet Take 1 tablet (200 mg total) by mouth every 6 (six) hours as needed for to loosen phlegm. 30 tablet 0  . loratadine (CLARITIN) 10 MG tablet TAKE 1/2 TABLET BY MOUTH EVERY DAY 15 tablet 2  . losartan (COZAAR) 50 MG tablet TAKE 1 TABLET (50 MG TOTAL) BY MOUTH AT BEDTIME. 30 tablet 0  . magnesium hydroxide (MILK OF MAGNESIA) 400 MG/5ML suspension Take 30 mLs by mouth daily as needed for mild constipation.    . ranitidine (ZANTAC) 150 MG capsule TAKE 1 CAPSULE BY MOUTH 2 TIMES DAILY AS NEEDED FOR heartburn 60 capsule 2  . rosuvastatin (CRESTOR) 20 MG tablet Take 1 tablet (20 mg total) by mouth at bedtime. 30 tablet 2  . tamoxifen (NOLVADEX) 20 MG tablet TAKE 1 TABLET (20 MG TOTAL) BY MOUTH DAILY. (Patient not taking: Reported on 09/16/2015) 30 tablet 3  . traMADol (ULTRAM) 50 MG tablet Take 1 tablet (50 mg total) by mouth every 8 (eight) hours as needed for severe pain. 75 tablet 0  . VOLTAREN 1 % GEL APPLY 2 GRAMS TOPICALLY 4 (FOUR) TIMES DAILY AS NEEDED FOR PAIN 100 g 0  . [DISCONTINUED] cetirizine (ZYRTEC) 5 MG  tablet Take 1 tablet (5 mg total) by mouth daily. Take at night time as it can cause some drowsiness 30 tablet 4   No current facility-administered medications for this visit.    Objective: Office vital signs reviewed. BP 145/75 mmHg  Pulse 87  Temp(Src) 99 F (37.2 C) (Oral)  Ht _0  (1.499 m)  Wt 54.432 kg (120 lb)  BMI 24.22 kg/m2   Physical Examination:  General: Awake, alert. Pleasant and in NAD. Daughter present and becomes tearful, patient raises voice to defend herself when he daughter talks about not taking medications.  Cardio: RRR, II/VI systolic murmur. No rubs or gallops. No pitting edema. Pulm: No increased WOB. CTAB, without wheezes, rhonchi or crackles noted.  MSK: Sitting in a wheelchair, normally uses a rolling walker. Patient is able to stand up herself with the use of her arms  and takes a few steps while holding on to things.  Skin: dry, intact, no rashes or lesions  Assessment/Plan: Chest congestion Patient was seen for this approximately 3 weeks ago. No evidence of pneumonia at that time. Concerns of URI/bronchitis vs flu. Her lung exam today is clear.  Given congestion seems to be her only complaint and is interfering with nutrition, will prescribe guaifenesin 216m q6hrs to loose phlegm. -Discussed drinking 1-2 Ensure per day at the minimum. -given daughter concerns and the fact the patient is in a wheel chair(normally in a RW) will have her follow up in 4 weeks or sooner if symptoms are not resolving or worsening.     No orders of the defined types were placed in this encounter.    Meds ordered this encounter  Medications  . traMADol (ULTRAM) 50 MG tablet    Sig: Take 1 tablet (50 mg total) by mouth every 8 (eight) hours as needed for severe pain.    Dispense:  75 tablet    Refill:  0  . guaiFENesin 200 MG tablet    Sig: Take 1 tablet (200 mg total) by mouth every 6 (six) hours as needed for to loosen phlegm.    Dispense:  30 tablet    Refill:  [email or web address], [place]

## 2015-10-01 NOTE — Patient Instructions (Signed)
Please contact the physical therapist if they are not continuing to come out to your house, this is very important. Start taking guaifenesin every 6 hours as needed for phlegm- this should dry it up. Please drink at least 1 Ensure per day, preferably 2.

## 2015-10-02 ENCOUNTER — Encounter: Payer: Self-pay | Admitting: Family Medicine

## 2015-10-02 ENCOUNTER — Other Ambulatory Visit: Payer: Self-pay | Admitting: Family Medicine

## 2015-10-02 NOTE — Assessment & Plan Note (Signed)
Patient was seen for this approximately 3 weeks ago. No evidence of pneumonia at that time. Concerns of URI/bronchitis vs flu. Her lung exam today is clear.  Given congestion seems to be her only complaint and is interfering with nutrition, will prescribe guaifenesin 200mg  q6hrs to loose phlegm. -Discussed drinking 1-2 Ensure per day at the minimum. -given daughter concerns and the fact the patient is in a wheel chair(normally in a RW) will have her follow up in 4 weeks or sooner if symptoms are not resolving or worsening.

## 2015-10-04 ENCOUNTER — Other Ambulatory Visit: Payer: Self-pay | Admitting: Family Medicine

## 2015-10-04 NOTE — Telephone Encounter (Signed)
Tamoxifen refilled. Pt will need to come in if she believes she still needs more antibiotics.  Thanks, [name], MD [facility] Family Medicine Resident  10/04/2015, 10:24 AM

## 2015-10-04 NOTE — Telephone Encounter (Signed)
Patient completely out of this med. Please refill

## 2015-10-06 ENCOUNTER — Other Ambulatory Visit: Payer: Self-pay

## 2015-10-06 NOTE — Patient Outreach (Signed)
[place] [facility]) Care Management  10/06/2015  YAHVI VENKATARAMAN [birth year] TR:[id number]   No response after phone conversation and letter being sent out to patient.    Plan: RN Health Coach will send in basket to Lurline Del, Care Management Assistant for case closure.    Jone Baseman, RN, MSN [place] [phone]

## 2015-10-07 ENCOUNTER — Telehealth: Payer: Self-pay | Admitting: Family Medicine

## 2015-10-07 NOTE — Telephone Encounter (Signed)
Family Medicine After hours phone call  Daughter called reporting giving patient an extra tablet of tamoxifen this morning. Patient is not acting differently with it. I advised her likely not much to do at this time, continue to monitor her and if she is acting differently or she is concerned can bring her in to ED to be evaluated. Recommended adding this medicine to the pill box that she uses for other medicines to avoid accidental dosing in the future. No further questions.  Tawanna Sat, MD 10/07/2015, 12:01 PM PGY-3, [place]

## 2015-10-11 ENCOUNTER — Other Ambulatory Visit: Payer: Self-pay | Admitting: Family Medicine

## 2015-10-11 ENCOUNTER — Encounter: Payer: Self-pay | Admitting: Family

## 2015-10-12 DIAGNOSIS — E119 Type 2 diabetes mellitus without complications: Secondary | ICD-10-CM | POA: Diagnosis not present

## 2015-10-12 DIAGNOSIS — G609 Hereditary and idiopathic neuropathy, unspecified: Secondary | ICD-10-CM | POA: Diagnosis not present

## 2015-10-12 DIAGNOSIS — M159 Polyosteoarthritis, unspecified: Secondary | ICD-10-CM | POA: Diagnosis not present

## 2015-10-12 DIAGNOSIS — I5022 Chronic systolic (congestive) heart failure: Secondary | ICD-10-CM | POA: Diagnosis not present

## 2015-10-12 DIAGNOSIS — B9629 Other Escherichia coli [E. coli] as the cause of diseases classified elsewhere: Secondary | ICD-10-CM | POA: Diagnosis not present

## 2015-10-12 DIAGNOSIS — N390 Urinary tract infection, site not specified: Secondary | ICD-10-CM | POA: Diagnosis not present

## 2015-10-13 ENCOUNTER — Ambulatory Visit (HOSPITAL_COMMUNITY)
Admission: RE | Admit: 2015-10-13 | Discharge: 2015-10-13 | Disposition: A | Discharge status: Home / Self Care | Payer: Medicare Other | Source: Ambulatory Visit | Attending: Family | Admitting: Family

## 2015-10-13 ENCOUNTER — Encounter: Payer: Self-pay | Admitting: Family

## 2015-10-13 ENCOUNTER — Ambulatory Visit (INDEPENDENT_AMBULATORY_CARE_PROVIDER_SITE_OTHER): Payer: Medicare Other | Admitting: Family

## 2015-10-13 ENCOUNTER — Other Ambulatory Visit: Payer: Self-pay | Admitting: Family

## 2015-10-13 VITALS — BP 191/79 | HR 74 | Temp 98.0°F | Resp 16 | Ht 59.0 in | Wt 115.0 lb

## 2015-10-13 DIAGNOSIS — I739 Peripheral vascular disease, unspecified: Secondary | ICD-10-CM | POA: Diagnosis not present

## 2015-10-13 DIAGNOSIS — E1151 Type 2 diabetes mellitus with diabetic peripheral angiopathy without gangrene: Secondary | ICD-10-CM | POA: Diagnosis not present

## 2015-10-13 NOTE — Progress Notes (Signed)
Filed Vitals:   10/13/15 1354 10/13/15 1401  BP: 187/77 191/79  Pulse: 74 74  Temp: 98 F (36.7 C)   TempSrc: Oral   Resp: 16   Height: 4\' 11"  (1.499 m)   Weight: 115 lb (52.164 kg)   SpO2: 97%

## 2015-10-13 NOTE — Progress Notes (Signed)
[facility] [place] HISTORY AND PHYSICAL -PAD  History of Present Illness [name] is a 81 y.o. female patient of Dr. Trula Slade who returns today for follow up of her PAD. She was given a trial of Neurontin which she said made things dramatically better. She does walk with a cane. She denies any cramps with walking. The patient denies nonhealing wounds.  The pt denies any history of stroke or TIA.  The patient is a diabetic. Her blood sugars are moderately well controlled.The patient does take a statin for hypercholesterolemia. She is medically managed for hypertension.  She was hospitalized at [facility] in October, 2015 for uncontrolled hypertension.  Pt has not had previous PAD intervention.  Pt Diabetic: Yes, Last A1C was 6.5 in August 2016 (review of records), improved from 9.0 in October, 2015. Pt smoker: non-smoker  Pt meds include: Statin :Yes Betablocker: Yes ASA: Yes Other anticoagulants/antiplatelets: no    Past Medical History  Diagnosis Date  . Breast cancer ([place]) 10/2010    Invasive Ductal Carcinoma, s/p bilateral mastectomy, now on Tamoxifen. Followed by Dr Jamse Arn.   . Diabetes mellitus   . Hypertension   . Hyperlipidemia   . Anemia     Iron def anemia  . CAD (coronary artery disease)   . CHF (congestive heart failure) (HCC)     Systolic   . History of colon cancer   . Neuropathy ([place])   . Allergy   . GERD (gastroesophageal reflux disease)   . Hiatal hernia   . Diverticulosis   . Asthma   . Arthritis   . H/O: GI bleed   . Breast cancer ([place]) 2011    s/p Bilateral masectomy  . Shortness of breath   . Pneumonia 03/2012  . Carotid artery aneurysm ([place]) 08/2010    right ICA, 5 x 26m  . DDD (degenerative disc disease), cervical   . Colon [place] ([place]   . Colon cancer ([place] 12/11/2013  . Myocardial infarction ([place]   . Hypercalcemia 06/09/2014    Social History Social History  Substance Use Topics  . Smoking status: Never  Smoker   . Smokeless tobacco: Current User    Types: Snuff  . Alcohol Use: No    Family History Family History  Problem Relation Age of Onset  . Sickle cell anemia Other   . Heart disease Mother   . Heart disease Father   . Cancer Daughter 357   breast ca  . Hypertension Daughter   . Cancer Daughter 546   breast ca  . Hypertension Daughter   . Heart disease Son     Poor circulation-Left Leg  . Diabetes Daughter   . Hypertension Daughter   . Hyperlipidemia Daughter     Poor circulation- Toe amputation    Past Surgical History  Procedure Laterality Date  . Breast masectomy  10/2010    Bilateral masectomy by Dr NLucia Gaskinss/p invasive ductal carcinoma.  . Cardiac  catherization  2007    Severe 3-vessel disease.  EF 20-25%.  . Esophagogastroduodenoscopy  2001    Esophageal Tear  . Total knee arthroplasty  2001  . Esophagogastroduodenoscopy  10/27/2012    Procedure: ESOPHAGOGASTRODUODENOSCOPY (EGD);  Surgeon: JIrene Shipper MD;  [facility]  Service: Endoscopy;  Laterality: N/A;  pat  . Breast surgery    . Joint replacement Right 2001    Knee    Allergies  Allergen Reactions  . Peanuts [Nuts] Swelling  . Lisinopril Cough  Current Outpatient Prescriptions  Medication Sig Dispense Refill  . acetaminophen (TYLENOL) 500 MG tablet Take 500 mg by mouth every 6 (six) hours as needed for moderate pain (pain).    Marland Kitchen albuterol (PROVENTIL HFA;VENTOLIN HFA) 108 (90 BASE) MCG/ACT inhaler Inhale 2 puffs into the lungs every 6 (six) hours as needed. For shortness of breath 1 Inhaler 6  . aspirin (ASPIRIN CHILDRENS) 81 MG chewable tablet Chew 81 mg by mouth daily.      . Blood Pressure KIT Use the blood pressure kit to check you blood pressure twice a day 1 each 0  . carvedilol (COREG) 12.5 MG tablet Take 1 tablet (12.5 mg total) by mouth 2 (two) times daily with a meal. 60 tablet 0  . clopidogrel (PLAVIX) 75 MG tablet TAKE 1 TABLET (75 MG TOTAL) BY MOUTH DAILY. 30 tablet 2   . colchicine 0.6 MG tablet Take 2 tabs by mouth for gout, then 1 tab in one hour. Do not take more for 3 days. 9 tablet 0  . Dextromethorphan HBr 15 MG CAPS Take 2 capsules (30 mg total) by mouth every 6 (six) hours as needed. 20 each 0  . diclofenac sodium (VOLTAREN) 1 % GEL APPLY 2 grams topically 4 TIMES DAILY AS NEEDED FOR PAIN 100 g 1  . gabapentin (NEURONTIN) 300 MG capsule TAKE ONE CAPSULE BY MOUTH TWICE A DAY 60 capsule 1  . glucose blood test strip Use as instructed Dx. Code 250.02 100 each 12  . guaiFENesin 200 MG tablet Take 1 tablet (200 mg total) by mouth every 6 (six) hours as needed for to loosen phlegm. 30 tablet 0  . loratadine (CLARITIN) 10 MG tablet TAKE 1/2 TABLET BY MOUTH EVERY DAY 15 tablet 2  . losartan (COZAAR) 50 MG tablet TAKE 1 TABLET (50 MG TOTAL) BY MOUTH AT BEDTIME. 30 tablet 0  . losartan (COZAAR) 50 MG tablet TAKE 1 TABLET BY MOUTH AT BEDTIME 30 tablet 1  . magnesium hydroxide (MILK OF MAGNESIA) 400 MG/5ML suspension Take 30 mLs by mouth daily as needed for mild constipation.    . ranitidine (ZANTAC) 150 MG capsule TAKE 1 CAPSULE BY MOUTH 2 TIMES DAILY AS NEEDED FOR heartburn 60 capsule 2  . rosuvastatin (CRESTOR) 20 MG tablet Take 1 tablet (20 mg total) by mouth at bedtime. 30 tablet 2  . tamoxifen (NOLVADEX) 20 MG tablet TAKE 1 TABLET (20 MG TOTAL) BY MOUTH DAILY. 30 tablet 3  . traMADol (ULTRAM) 50 MG tablet Take 1 tablet (50 mg total) by mouth every 8 (eight) hours as needed for severe pain. 75 tablet 0  . VOLTAREN 1 % GEL APPLY 2 GRAMS TOPICALLY 4 (FOUR) TIMES DAILY AS NEEDED FOR PAIN 100 g 0  . losartan (COZAAR) 50 MG tablet TAKE 1 TABLET (50 MG TOTAL) BY MOUTH AT BEDTIME. (Patient not taking: Reported on 10/13/2015) 30 tablet 4  . [DISCONTINUED] cetirizine (ZYRTEC) 5 MG tablet Take 1 tablet (5 mg total) by mouth daily. Take at night time as it can cause some drowsiness 30 tablet 4   No current facility-administered medications for this visit.    ROS: See  HPI for pertinent positives and negatives.   Physical Examination  Filed Vitals:   10/13/15 1354  BP: 187/77  Pulse: 74  Temp: 98 F (36.7 C)  TempSrc: Oral  Resp: 16  Height: 4' 11" (1.499 m)  Weight: 115 lb (52.164 kg)  SpO2: 97%   Body mass index is 23.21 kg/(m^2).  General: A&O  x 3, WDWN. Gait: in wheelchair Eyes: PERRLA. Pulmonary: CTAB, without wheezes , rales or rhonchi. Cardiac: regular rhythm, no detected murmur.     Carotid Bruits Right Left   Negative Negative  Aorta is not palpable. Radial pulses: right is 1+, left is 2+ palpable   VASCULAR EXAM: Extremities without ischemic changes  without Gangrene; without open wounds.     LE Pulses Right Left   FEMORAL not palpable not palpable    POPLITEAL 1+ palpable  1+ palpable   POSTERIOR TIBIAL not palpable  not palpable    DORSALIS PEDIS  ANTERIOR TIBIAL 2+ palpable  1+ palpable    Abdomen: soft, NT, no palpated masses. Skin: no rashes, no ulcers. Musculoskeletal: no muscle wasting or atrophy. Neurologic: A&O X 3; Appropriate Affect, MOTOR FUNCTION: moving all extremities equally, motor strength 5/5 throughout. Speech is fluent/normal. CN 2-12 intact.          Non-Invasive Vascular Imaging: DATE: 10/13/2015 ABI: RIGHT: 0.69 (0.91 with monophasic waveforms -falsely elevated, on 10/12/14), Waveforms: monophasic, TBI: 0.48 (0.47) ;  LEFT: 0.84 (1.15 with biphasic waveforms), Waveforms: triphasic, TBI: 0.62 (0.48)   ASSESSMENT: SUNNIE ODDEN is a 81 y.o. female with mild/moderate PAD, has no claudication symptoms and no signs of ischemia in her legs/feet. She is performing her seated leg exercises daily. TBI has improved on the left, stable on the right. Moderate arterial  occlusive disease in the right leg, mild in the left.   I advised pt to follow up with her PCP ASAP to address her elevated blood pressure.   PLAN:  Continue daily seated leg exercises.  Based on the patient's vascular studies and examination, pt will return to clinic in 1 year with ABI's.  I discussed in depth with the patient the nature of atherosclerosis, and emphasized the importance of maximal medical management including strict control of blood pressure, blood glucose, and lipid levels, obtaining regular exercise, and continued cessation of smoking.  The patient is aware that without maximal medical management the underlying atherosclerotic disease process will progress, limiting the benefit of any interventions.  The patient was given information about PAD including signs, symptoms, treatment, what symptoms should prompt the patient to seek immediate medical care, and risk reduction measures to take.  Clemon Chambers, RN, MSN, FNP-C [facility] Phone: [phone]  Clinic MD: Scot Dock  10/13/2015 2:01 PM

## 2015-10-13 NOTE — Patient Instructions (Signed)
Peripheral Vascular Disease Peripheral vascular disease (PVD) is a disease of the blood vessels that are not part of your heart and brain. A simple term for PVD is poor circulation. In most cases, PVD narrows the blood vessels that carry blood from your heart to the rest of your body. This can result in a decreased supply of blood to your arms, legs, and internal organs, like your stomach or kidneys. However, it most often affects a person's lower legs and feet. There are two types of PVD.  Organic PVD. This is the more common type. It is caused by damage to the structure of blood vessels.  Functional PVD. This is caused by conditions that make blood vessels contract and tighten (spasm). Without treatment, PVD tends to get worse over time. PVD can also lead to acute ischemic limb. This is when an arm or limb suddenly has trouble getting enough blood. This is a medical emergency. CAUSES Each type of PVD has many different causes. The most common cause of PVD is buildup of a fatty material (plaque) inside of your arteries (atherosclerosis). Small amounts of plaque can break off from the walls of the blood vessels and become lodged in a smaller artery. This blocks blood flow and can cause acute ischemic limb. Other common causes of PVD include:  Blood clots that form inside of blood vessels.  Injuries to blood vessels.  Diseases that cause inflammation of blood vessels or cause blood vessel spasms.  Health behaviors and health history that increase your risk of developing PVD. RISK FACTORS  You may have a greater risk of PVD if you:  Have a family history of PVD.  Have certain medical conditions, including:  High cholesterol.  Diabetes.  High blood pressure (hypertension).  Coronary heart disease.  Past problems with blood clots.  Past injury, such as burns or a broken bone. These may have damaged blood vessels in your limbs.  Buerger disease. This is caused by inflamed blood  vessels in your hands and feet.  Some forms of arthritis.  Rare birth defects that affect the arteries in your legs.  Use tobacco.  Do not get enough exercise.  Are obese.  Are age 50 or older. SIGNS AND SYMPTOMS  PVD may cause many different symptoms. Your symptoms depend on what part of your body is not getting enough blood. Some common signs and symptoms include:  Cramps in your lower legs. This may be a symptom of poor leg circulation (claudication).  Pain and weakness in your legs while you are physically active that goes away when you rest (intermittent claudication).  Leg pain when at rest.  Leg numbness, tingling, or weakness.  Coldness in a leg or foot, especially when compared with the other leg.  Skin or hair changes. These can include:  Hair loss.  Shiny skin.  Pale or bluish skin.  Thick toenails.  Inability to get or maintain an erection (erectile dysfunction). People with PVD are more prone to developing ulcers and sores on their toes, feet, or legs. These may take longer than normal to heal. DIAGNOSIS Your health care provider may diagnose PVD from your signs and symptoms. The health care provider will also do a physical exam. You may have tests to find out what is causing your PVD and determine its severity. Tests may include:  Blood pressure recordings from your arms and legs and measurements of the strength of your pulses (pulse volume recordings).  Imaging studies using sound waves to take pictures of   the blood flow through your blood vessels (Doppler ultrasound).  Injecting a dye into your blood vessels before having imaging studies using:  X-rays (angiogram or arteriogram).  Computer-generated X-rays (CT angiogram).  A powerful electromagnetic field and a computer (magnetic resonance angiogram or MRA). TREATMENT Treatment for PVD depends on the cause of your condition and the severity of your symptoms. It also depends on your age. Underlying  causes need to be treated and controlled. These include long-lasting (chronic) conditions, such as diabetes, high cholesterol, and high blood pressure. You may need to first try making lifestyle changes and taking medicines. Surgery may be needed if these do not work. Lifestyle changes may include:  Quitting smoking.  Exercising regularly.  Following a low-fat, low-cholesterol diet. Medicines may include:  Blood thinners to prevent blood clots.  Medicines to improve blood flow.  Medicines to improve your blood cholesterol levels. Surgical procedures may include:  A procedure that uses an inflated balloon to open a blocked artery and improve blood flow (angioplasty).  A procedure to put in a tube (stent) to keep a blocked artery open (stent implant).  Surgery to reroute blood flow around a blocked artery (peripheral bypass surgery).  Surgery to remove dead tissue from an infected wound on the affected limb.  Amputation. This is surgical removal of the affected limb. This may be necessary in cases of acute ischemic limb that are not improved through medical or surgical treatments. HOME CARE INSTRUCTIONS  Take medicines only as directed by your health care provider.  Do not use any tobacco products, including cigarettes, chewing tobacco, or electronic cigarettes. If you need help quitting, ask your health care provider.  Lose weight if you are overweight, and maintain a healthy weight as directed by your health care provider.  Eat a diet that is low in fat and cholesterol. If you need help, ask your health care provider.  Exercise regularly. Ask your health care provider to suggest some good activities for you.  Use compression stockings or other mechanical devices as directed by your health care provider.  Take good care of your feet.  Wear comfortable shoes that fit well.  Check your feet often for any cuts or sores. SEEK MEDICAL CARE IF:  You have cramps in your legs  while walking.  You have leg pain when you are at rest.  You have coldness in a leg or foot.  Your skin changes.  You have erectile dysfunction.  You have cuts or sores on your feet that are not healing. SEEK IMMEDIATE MEDICAL CARE IF:  Your arm or leg turns cold and blue.  Your arms or legs become red, warm, swollen, painful, or numb.  You have chest pain or trouble breathing.  You suddenly have weakness in your face, arm, or leg.  You become very confused or lose the ability to speak.  You suddenly have a very bad headache or lose your vision.   This information is not intended to replace advice given to you by your health care provider. Make sure you discuss any questions you have with your health care provider.   Document Released: 12/07/2004 Document Revised: 11/20/2014 Document Reviewed: 04/09/2014 Elsevier Interactive Patient Education 2016 [employer] Inc[employer]  

## 2015-10-18 ENCOUNTER — Other Ambulatory Visit: Payer: Self-pay | Admitting: *Deleted

## 2015-10-18 MED ORDER — TAMOXIFEN CITRATE 20 MG PO TABS: ORAL_TABLET | ORAL | Status: DC

## 2015-10-27 ENCOUNTER — Ambulatory Visit (INDEPENDENT_AMBULATORY_CARE_PROVIDER_SITE_OTHER): Payer: Medicare Other | Admitting: Family Medicine

## 2015-10-27 VITALS — BP 135/85 | HR 71 | Temp 97.5°F | Wt 112.9 lb

## 2015-10-27 DIAGNOSIS — R634 Abnormal weight loss: Secondary | ICD-10-CM | POA: Diagnosis not present

## 2015-10-27 DIAGNOSIS — H04123 Dry eye syndrome of bilateral lacrimal glands: Secondary | ICD-10-CM | POA: Diagnosis not present

## 2015-10-27 DIAGNOSIS — R112 Nausea with vomiting, unspecified: Secondary | ICD-10-CM | POA: Diagnosis present

## 2015-10-27 MED ORDER — OLOPATADINE HCL 0.2 % OP SOLN: 1.0000 [drp] | Freq: Every day | OPHTHALMIC | Status: DC

## 2015-10-27 NOTE — Patient Instructions (Addendum)
Starting drinking 2 Ensures per day. Start using the eye drops I prescribed daily. Continue with your current blood pressure medication

## 2015-10-27 NOTE — Progress Notes (Signed)
Patient ID: [name], female   DOB: September 28, 1924, 81 y.o.   MRN: [id number]    Subjective: CC: f/u weight HPI: Patient is a 81 y.o. female with a past medical history of HTN, DM, breast cancer, OA, among other things  presenting to clinic today for f/u on eating.  Her "eyes feel cold", like something is in them over the last week. No scratchy/itching, pain. No conjunctival redness. Hasn't done anything for it. Doesn't recall getting anything in her eyes. No runny nose, sore throat, cough, congestion. Had cataract surgery in the last year without issues. Doesn't note any change in vision, halos, or bright colors.   Peripheral neuropathy: feet feel heavy intermittently. Pins/needles sensation and burning sensation are absent, note she's never had these in her feet. At night she has pain in her heels. Requests diabetic shoes to protect her feet. She does have PAD.  Currently doing gabapentin BID without drowsiness.   Anorexia: At her last visit, her daughter noted the patient was refusing to eat oftentimes and would also refuse medications. The patient noted congestion to be the etiology. Since that time the congesting has resolved and the patient is eating well. Daughter agrees. SDrinking 1 Ensure per day. Clothes are fitting the same way. Patient is doing much better and doesn't feel like she's losing weight. Daughter agrees.   Social History: continues to do snuff.   ROS: All other systems reviewed and are negative.  Past Medical History Patient Active Problem List   Diagnosis Date Noted  . Dry eye 10/28/2015  . Pyelonephritis 09/16/2015  . (HFpEF) heart failure with preserved ejection fraction ([place])   . CAD in native artery   . Emesis, persistent   . Chest congestion 09/08/2015  . Gout 05/14/2015  . UTI (urinary tract infection) 04/15/2015  . Acute delirium 04/15/2015  . Fever 04/07/2015  . Hypertension 04/07/2015  . Dyslipidemia   . Essential hypertension   . Seasonal allergies  02/10/2015  . Nausea with vomiting 02/10/2015  . Flank pain 02/10/2015  . Stroke-like symptoms   . Stroke-like episode ([place]) 01/25/2015  . Syncope and collapse 01/25/2015  . Accelerated hypertension   . Hypertensive emergency 09/19/2014  . Anemia of chronic illness 08/19/2014  . Acute superficial venous thrombosis of right lower extremity 08/19/2014  . Hypercalcemia 06/09/2014  . Breast cancer, left breast ([place]) 06/09/2014  . Breast cancer, right breast ([place]) 06/09/2014  . Edema of left lower extremity 03/30/2014  . PAD (peripheral artery disease) ([place]) 03/30/2014  . Colon cancer ([place]) 12/11/2013  . Neck pain 10/22/2013  . History of fall 05/15/2013  . Memory deficit 05/15/2013  . Bilateral leg pain 03/07/2013  . Trigger thumb of right hand 03/07/2013  . Lower extremity edema 02/19/2013  . Stricture and stenosis of esophagus 10/27/2012  . Poor social situation 09/13/2012  . Low back pain 08/07/2012  . Musculoskeletal chest pain 07/10/2012  . Thoracic back pain 05/24/2012  . Weight loss 04/03/2012  . Insomnia 09/25/2011  . Tobacco abuse 06/02/2011  . History of breast cancer in female, bilateral.  Mastectomies 10/24/2010. 04/26/2011  . Hiatal hernia 03/22/2011  . Colon polyp 03/22/2011  . Poor circulation 03/22/2011  . 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 NEUROPATHY 08/25/2009  . ANEMIA-IRON DEFICIENCY 04/29/2007  . HLD (hyperlipidemia) 01/29/2007  . Diabetes type 2, controlled ([place]) 01/10/2007  . HYPERTENSION, BENIGN SYSTEMIC 01/10/2007  . Coronary atherosclerosis 01/10/2007  . Chronic systolic heart failure ([place]) 01/10/2007  . Asthma 01/10/2007  . Reflux esophagitis  01/10/2007  . Osteoarthritis, multiple sites 01/10/2007    Medications- reviewed and updated Current Outpatient Prescriptions  Medication Sig Dispense Refill  . acetaminophen (TYLENOL) 500 MG tablet Take 500 mg by mouth every 6 (six) hours as needed for moderate pain (pain).    Marland Kitchen albuterol  (PROVENTIL HFA;VENTOLIN HFA) 108 (90 BASE) MCG/ACT inhaler Inhale 2 puffs into the lungs every 6 (six) hours as needed. For shortness of breath 1 Inhaler 6  . aspirin (ASPIRIN CHILDRENS) 81 MG chewable tablet Chew 81 mg by mouth daily.      . Blood Pressure KIT Use the blood pressure kit to check you blood pressure twice a day 1 each 0  . carvedilol (COREG) 12.5 MG tablet Take 1 tablet (12.5 mg total) by mouth 2 (two) times daily with a meal. 60 tablet 0  . clopidogrel (PLAVIX) 75 MG tablet TAKE 1 TABLET (75 MG TOTAL) BY MOUTH DAILY. 30 tablet 2  . colchicine 0.6 MG tablet Take 2 tabs by mouth for gout, then 1 tab in one hour. Do not take more for 3 days. 9 tablet 0  . diclofenac sodium (VOLTAREN) 1 % GEL APPLY 2 grams topically 4 TIMES DAILY AS NEEDED FOR PAIN 100 g 1  . gabapentin (NEURONTIN) 300 MG capsule TAKE ONE CAPSULE BY MOUTH TWICE A DAY 60 capsule 1  . glucose blood test strip Use as instructed Dx. Code 250.02 100 each 12  . loratadine (CLARITIN) 10 MG tablet TAKE 1/2 TABLET BY MOUTH EVERY DAY 15 tablet 2  . losartan (COZAAR) 50 MG tablet TAKE 1 TABLET (50 MG TOTAL) BY MOUTH AT BEDTIME. 30 tablet 0  . magnesium hydroxide (MILK OF MAGNESIA) 400 MG/5ML suspension Take 30 mLs by mouth daily as needed for mild constipation.    . Olopatadine HCl (PATADAY) 0.2 % SOLN Apply 1 drop to eye daily. 2.5 mL 0  . ranitidine (ZANTAC) 150 MG capsule TAKE 1 CAPSULE BY MOUTH 2 TIMES DAILY AS NEEDED FOR heartburn 60 capsule 2  . rosuvastatin (CRESTOR) 20 MG tablet Take 1 tablet (20 mg total) by mouth at bedtime. 30 tablet 2  . tamoxifen (NOLVADEX) 20 MG tablet TAKE 1 TABLET (20 MG TOTAL) BY MOUTH DAILY. 30 tablet 3  . traMADol (ULTRAM) 50 MG tablet Take 1 tablet (50 mg total) by mouth every 8 (eight) hours as needed for severe pain. 75 tablet 0  . VOLTAREN 1 % GEL APPLY 2 GRAMS TOPICALLY 4 (FOUR) TIMES DAILY AS NEEDED FOR PAIN 100 g 0  . [DISCONTINUED] cetirizine (ZYRTEC) 5 MG tablet Take 1 tablet (5 mg  total) by mouth daily. Take at night time as it can cause some drowsiness 30 tablet 4   No current facility-administered medications for this visit.    Objective: Office vital signs reviewed. BP 135/85 mmHg  Pulse 71  Temp(Src) 97.5 F (36.4 C) (Oral)  Wt 112 lb 14.4 oz (51.211 kg)   Physical Examination:  General: Awake, alert. Pleasant and in NAD. Daughter present as well.  Eyes: No conjunctival injection. EOMI without pain. PERRL. Fundoscopic exam normal.  Cardio: RRR, II/VI systolic murmur. No rubs or gallops. No pitting edema. Pulm: No increased WOB. CTAB, without wheezes, rhonchi or crackles noted.  MSK: Sitting in char, has rolling walker with her. Patient is able to stand up herself and ambulates much better today with her RW.  Skin: dry, intact, no rashes or lesions  Assessment/Plan: Weight loss Patient's weight slightly down today at 112lb from 115 last month.  Both the patient and her daughter feel her weight is stable and that her clothes may have contributed some. Clothes fitting the same way.  - will hold off on stimulants for now - will attempt to get in 2 Ensures per day as supplements. - continue to monitor, f/u in 3 months or sooner as needed. Pt and daughter in agreement.  Dry eye Patient with a h/o allergic conjunctivitis, has had Pataday ophth soln in the past. Currently no evidence of infection. No conjunctival injection or pain with EOM concerning for glaucoma. No other URI type symptoms. Suggested using lubricating eye drops, discussed that eyes are more likely to become dry this time of year. Patient requested refill on Pataday to have it around in case lubricating eye drops do not work. Discussed RTC precautions: red eyes, pain with EOM, drainage, vision change.     No orders of the defined types were placed in this encounter.    Meds ordered this encounter  Medications  . Olopatadine HCl (PATADAY) 0.2 % SOLN    Sig: Apply 1 drop to eye daily.     Dispense:  2.5 mL    Refill:  [facility] PGY-2, [place]

## 2015-10-28 ENCOUNTER — Encounter: Payer: Self-pay | Admitting: Family Medicine

## 2015-10-28 DIAGNOSIS — H04123 Dry eye syndrome of bilateral lacrimal glands: Secondary | ICD-10-CM | POA: Insufficient documentation

## 2015-10-28 NOTE — Assessment & Plan Note (Signed)
Patient's weight slightly down today at 112lb from 115 last month. Both the patient and her daughter feel her weight is stable and that her clothes may have contributed some. Clothes fitting the same way.  - will hold off on stimulants for now - will attempt to get in 2 Ensures per day as supplements. - continue to monitor, f/u in 3 months or sooner as needed. Pt and daughter in agreement.

## 2015-10-28 NOTE — Assessment & Plan Note (Signed)
Patient with a h/o allergic conjunctivitis, has had Pataday ophth soln in the past. Currently no evidence of infection. No conjunctival injection or pain with EOM concerning for glaucoma. No other URI type symptoms. Suggested using lubricating eye drops, discussed that eyes are more likely to become dry this time of year. Patient requested refill on Pataday to have it around in case lubricating eye drops do not work. Discussed RTC precautions: red eyes, pain with EOM, drainage, vision change.

## 2015-10-31 ENCOUNTER — Other Ambulatory Visit: Payer: Self-pay | Admitting: Family Medicine

## 2015-11-02 ENCOUNTER — Other Ambulatory Visit: Payer: Self-pay | Admitting: Family Medicine

## 2015-11-02 DIAGNOSIS — M546 Pain in thoracic spine: Secondary | ICD-10-CM

## 2015-11-02 NOTE — Telephone Encounter (Signed)
Daughter called and would like a refill on her mothers pain medication left up front. jw

## 2015-11-03 MED ORDER — TRAMADOL HCL 50 MG PO TABS: 50.0000 mg | ORAL_TABLET | Freq: Three times a day (TID) | ORAL | Status: DC | PRN

## 2015-11-03 NOTE — Telephone Encounter (Signed)
Tramadol at the front desk.  Thanks, [name], MD [facility] Resident  11/03/2015, 5:36 PM

## 2015-11-04 NOTE — Telephone Encounter (Signed)
Attempted to call, no response and no VM.  Will await callback. Fleeger, [name]

## 2015-11-07 IMAGING — DX DG ABDOMEN 1V
1 series · 1 of 1 positions shown · non-contrast
Comparison: 01/25/2014 CT.

CLINICAL DATA: 89-year-old female with abdominal pain and nausea
and vomiting today.

EXAM:
ABDOMEN - 1 VIEW

[abdomen kub]
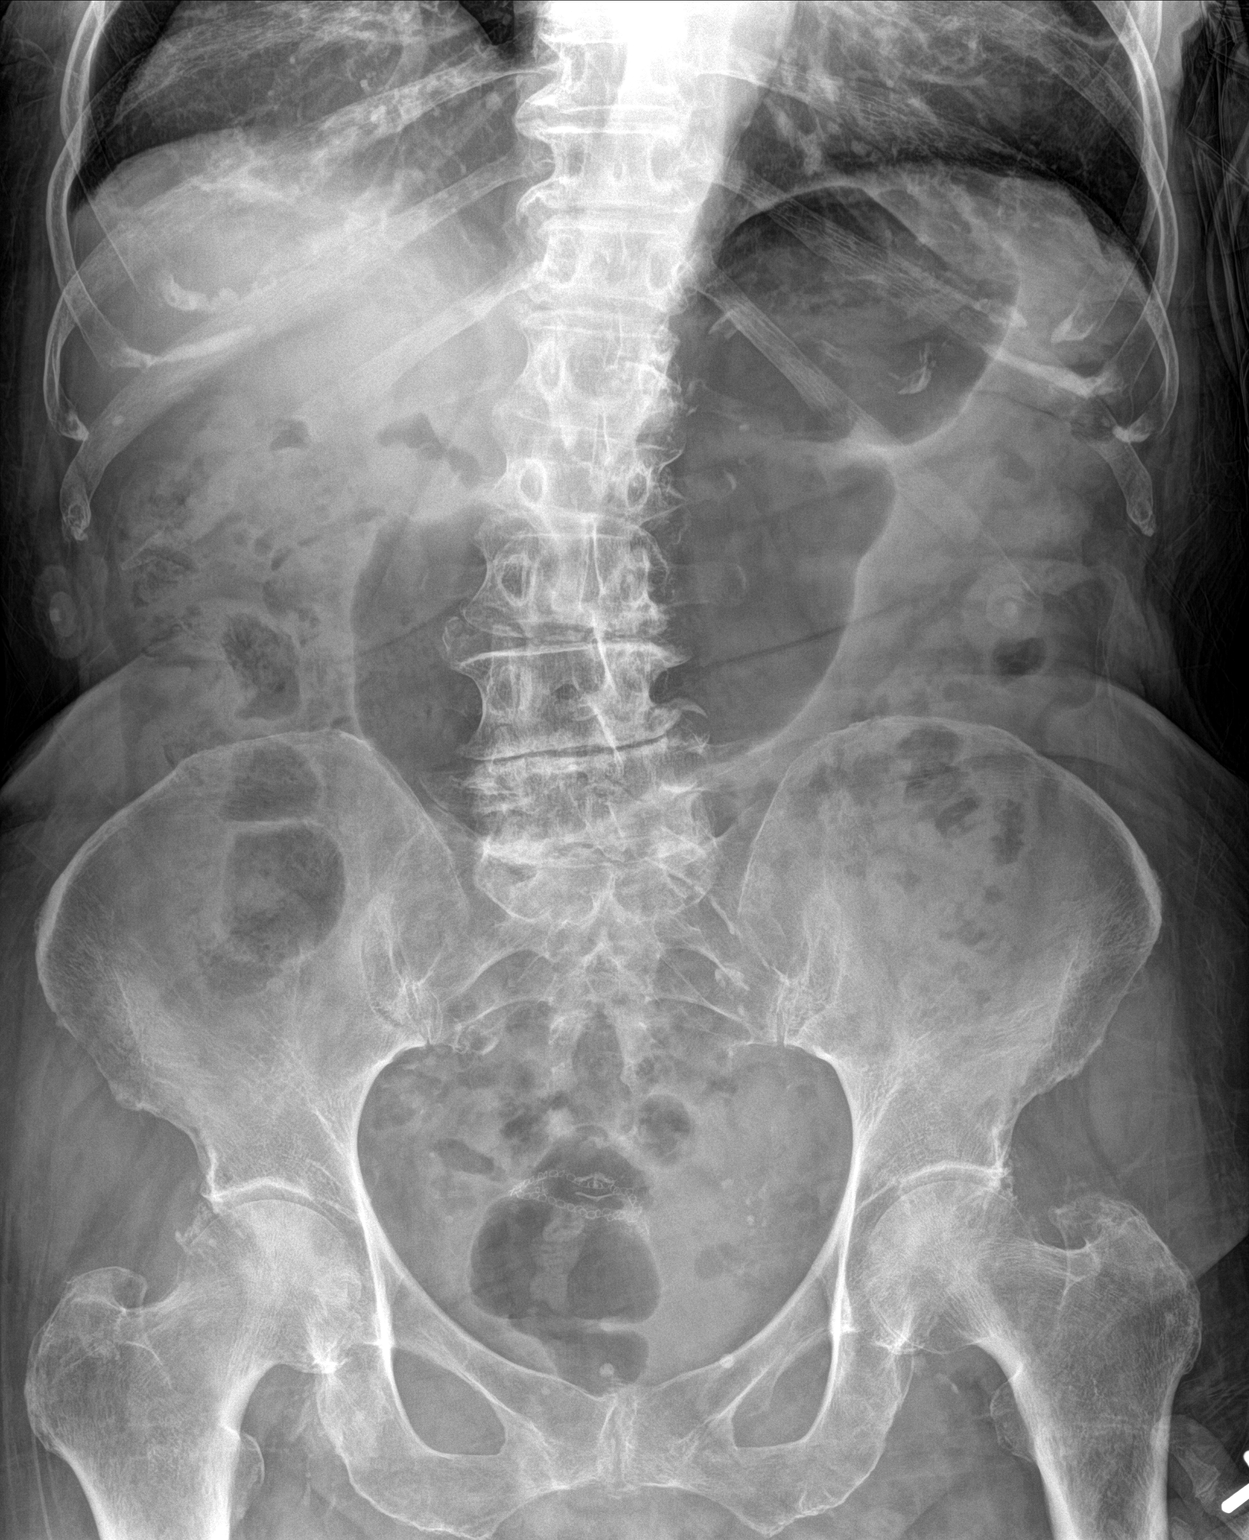

[1 of 1 positions shown; findings below may reference images not displayed]

FINDINGS: Mild gaseous distension of the stomach noted.

The bowel gas pattern is otherwise unremarkable.

No dilated small bowel loops or colon noted.

Surgical sutures in the region of the rectum noted.

No acute bony abnormalities present.

No suspicious calcifications are identified.
IMPRESSION: Mild nonspecific gaseous distension of the stomach. Bowel gas
pattern otherwise unremarkable.

## 2015-11-07 IMAGING — CR DG CHEST 1V PORT
1 series · 1 of 1 positions shown · non-contrast
Comparison: 09/08/2015

CLINICAL DATA: Vomiting

EXAM:
PORTABLE CHEST 1 VIEW

[AP]
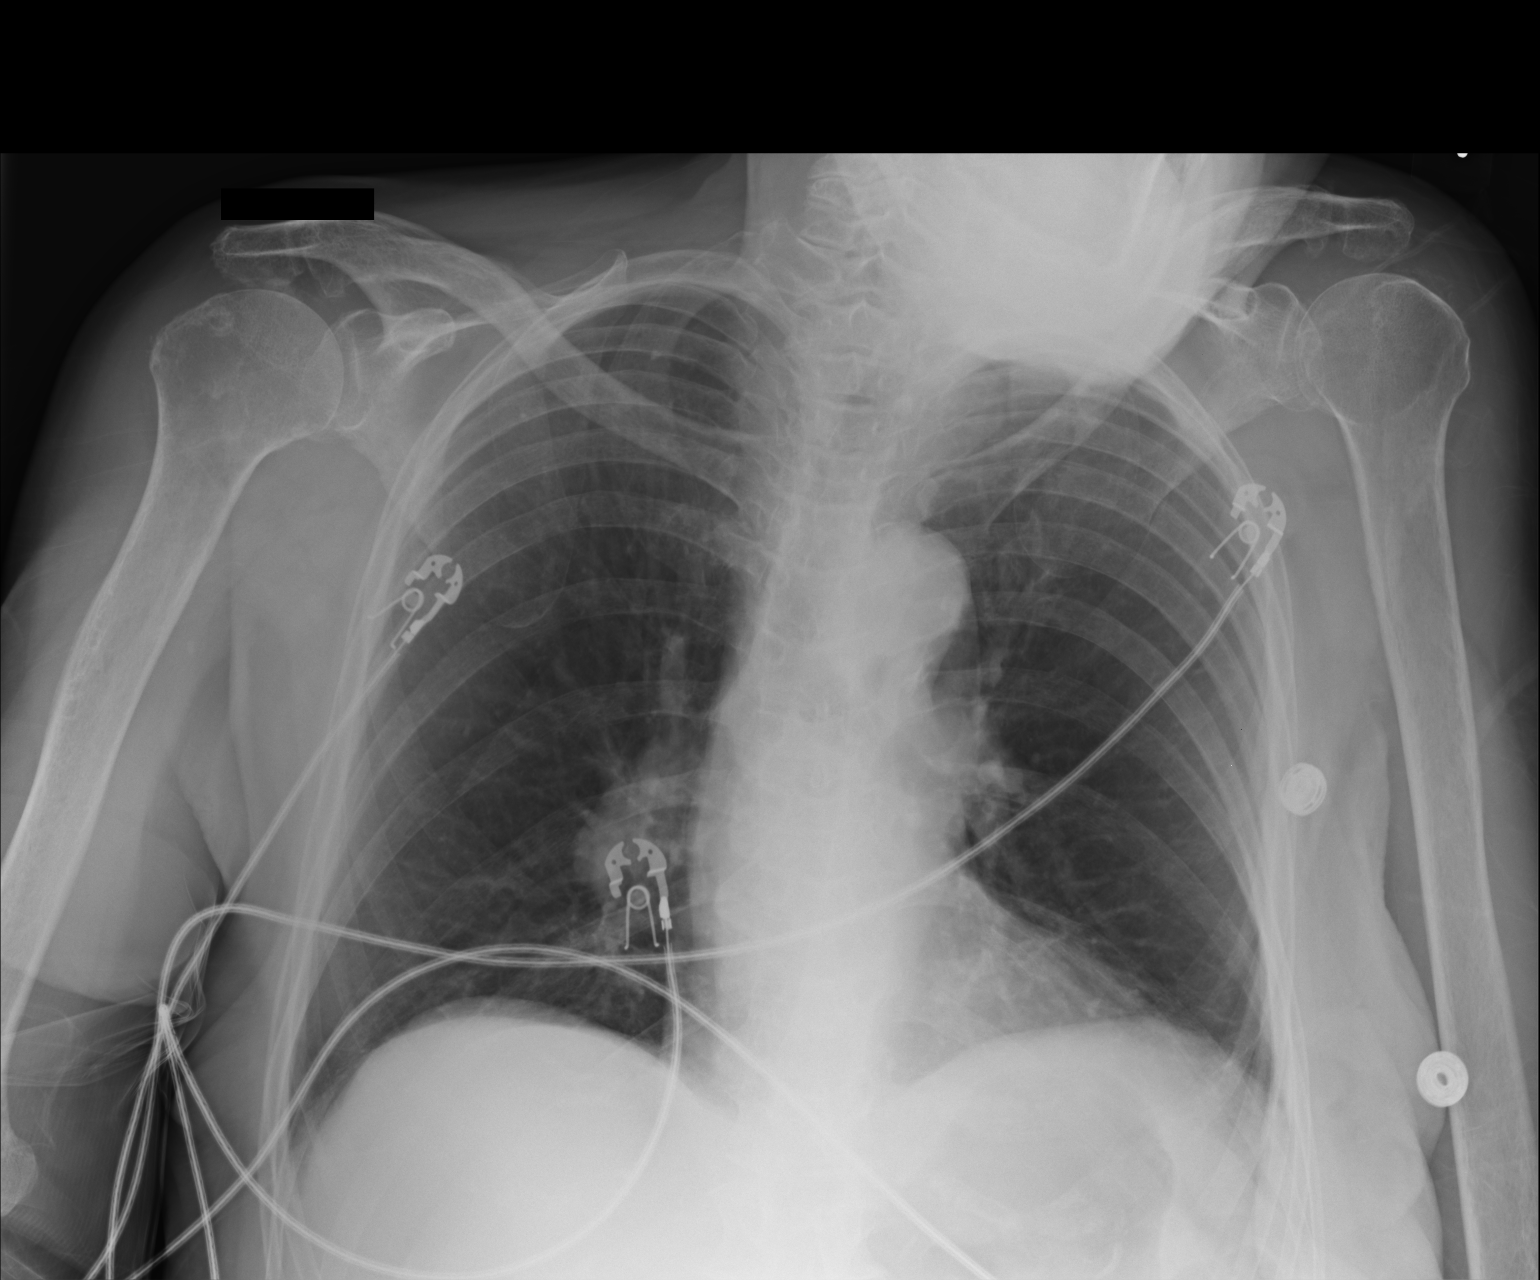

[1 of 1 positions shown; findings below may reference images not displayed]

FINDINGS: The heart size and mediastinal contours are within normal limits.
Both lungs are clear. The visualized skeletal structures are
unremarkable.
IMPRESSION: No active disease.

## 2015-11-07 IMAGING — CT CT HEAD W/O CM
2 series · 15 of 30 positions shown, 17 images · non-contrast
Comparison: 01/25/2015 head CT and brain MR.

CLINICAL DATA: Tremors, n/v this morning when waking up. Pt had a
period of unresponsiveness and shaking while in waiting room. Pt
denies any complaints. Pt states she has had the flu.

EXAM:
CT HEAD WITHOUT CONTRAST
TECHNIQUE: Contiguous axial images were obtained from the base of the skull
through the vertex without intravenous contrast.

[Series 2: head without · axial · non-contrast · 0.43mm/px · z∈[-111,+9]mm · 7 of 32 slices shown, 9 images]
[im 4/32  brain]
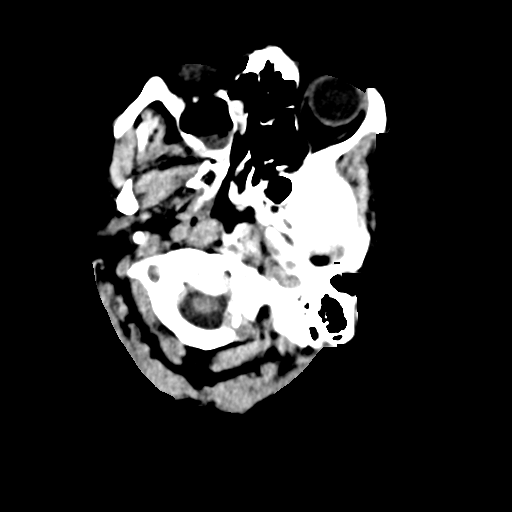
[im 4/32  bone]
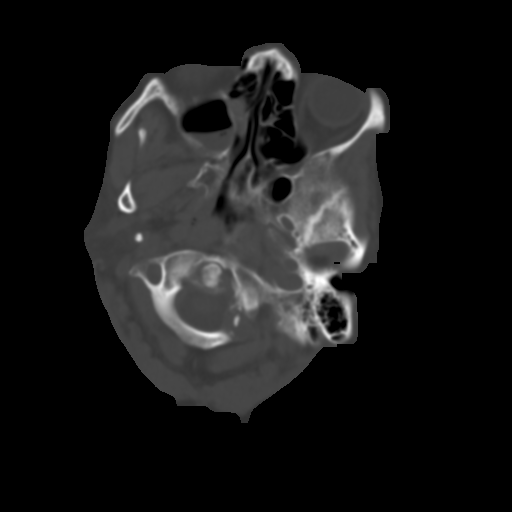
[im 8/32  brain]
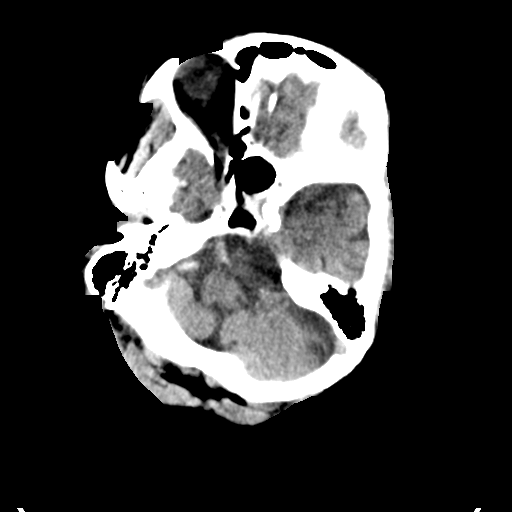
[im 12/32  brain]
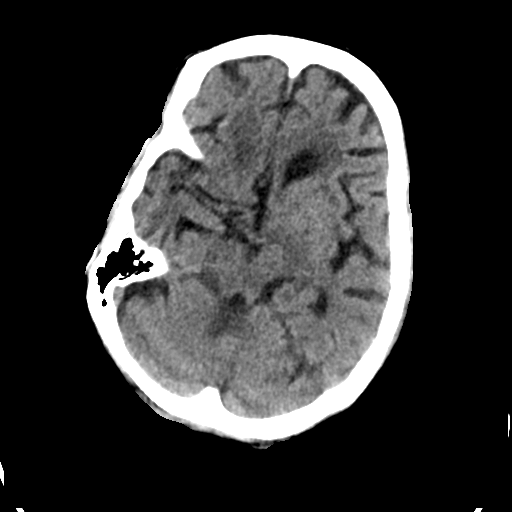
[im 16/32  brain]
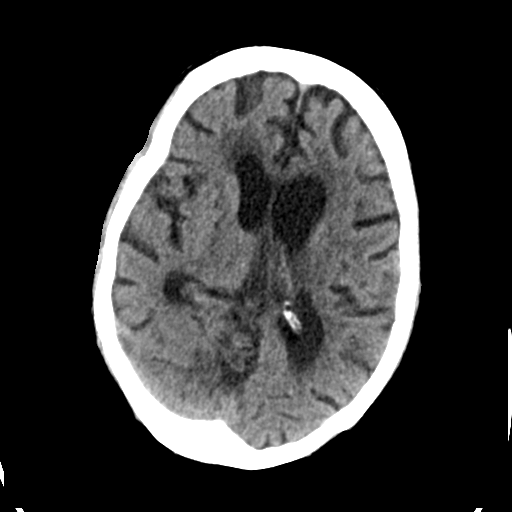
[im 20/32  brain]
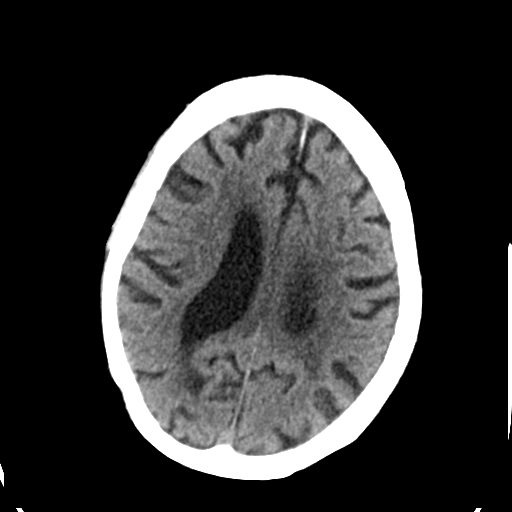
[im 20/32  bone]
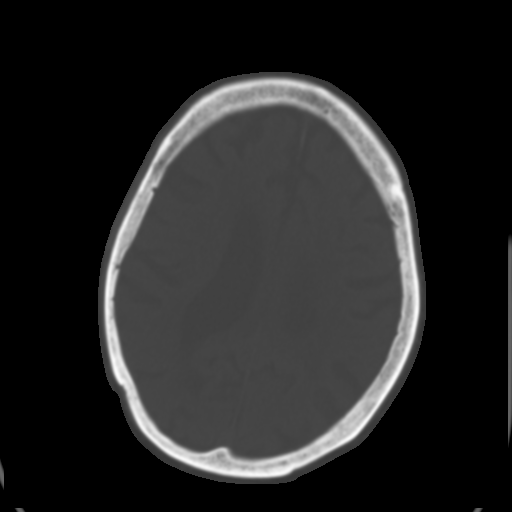
[im 24/32  brain]
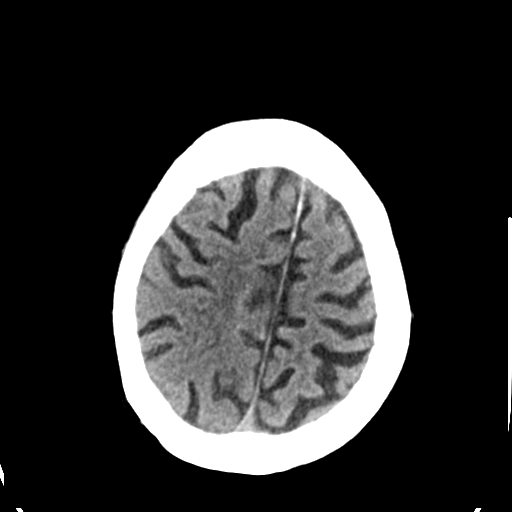
[im 28/32  brain]
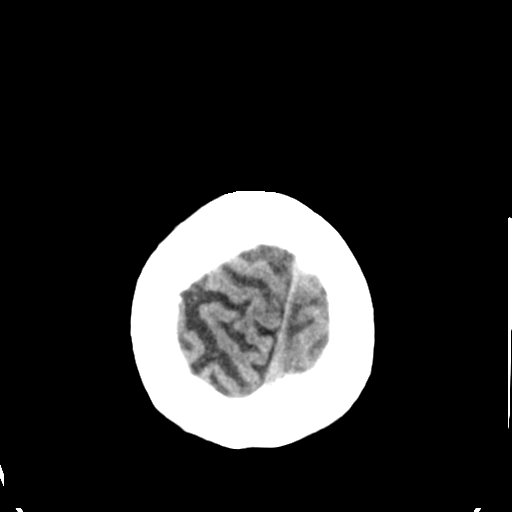

[Series 3: head bone · axial · 0.43mm/px · z∈[-112,+14]mm · 8 of 79 slices shown]
[im 8/79  bone]
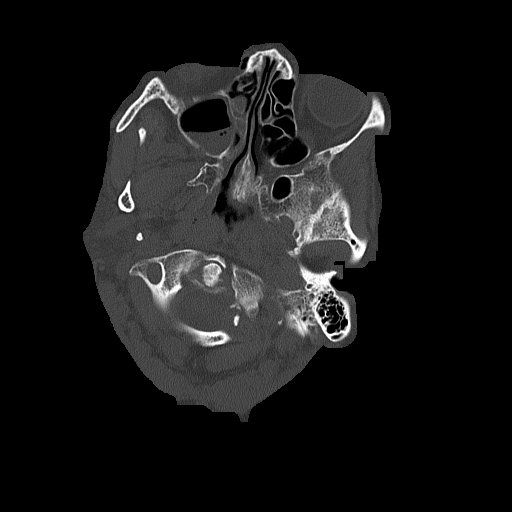
[im 16/79  bone]
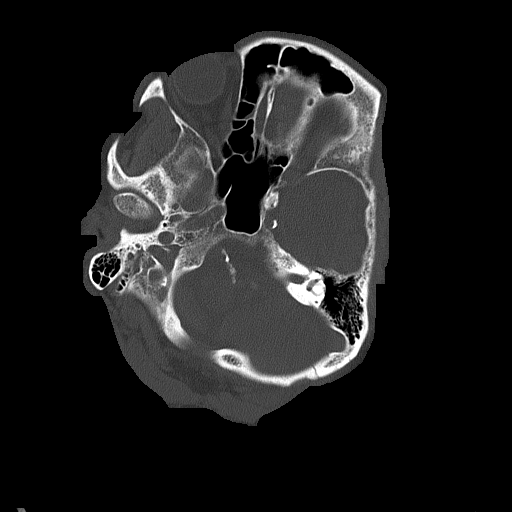
[im 24/79  bone]
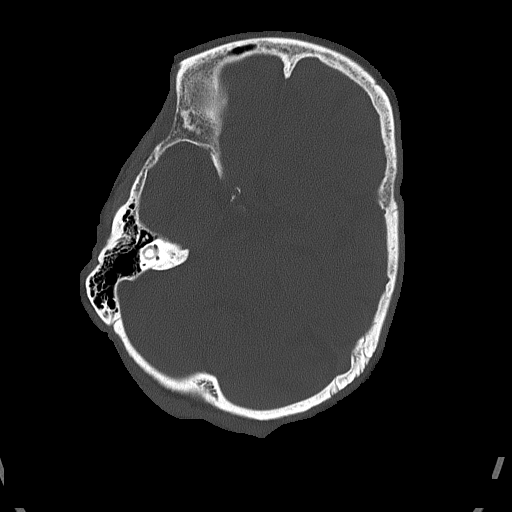
[im 36/79  bone]
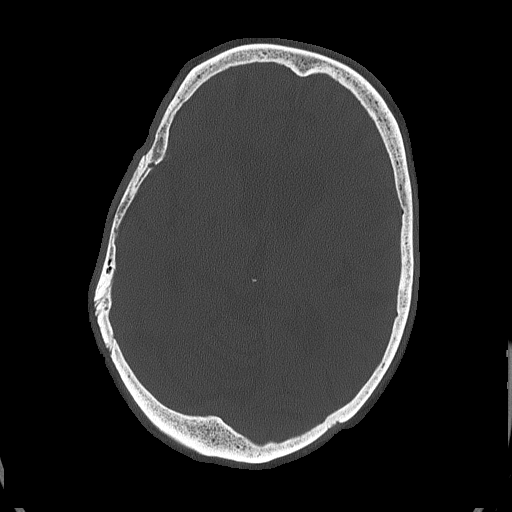
[im 43/79  bone]
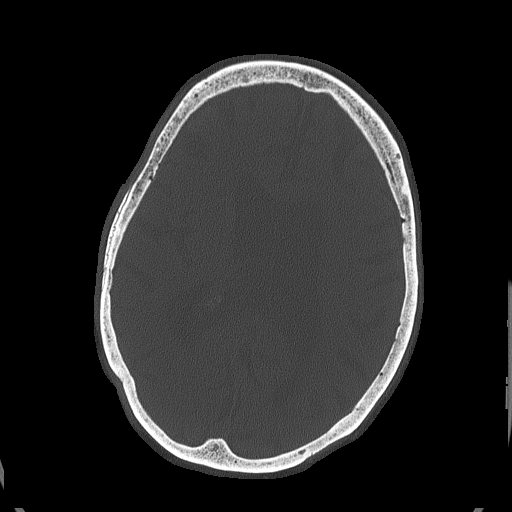
[im 55/79  bone]
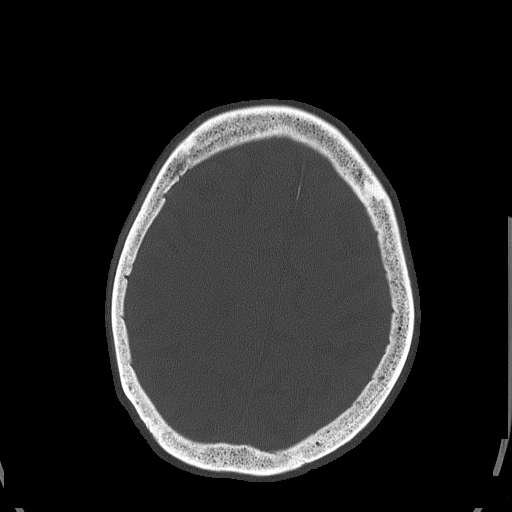
[im 63/79  bone]
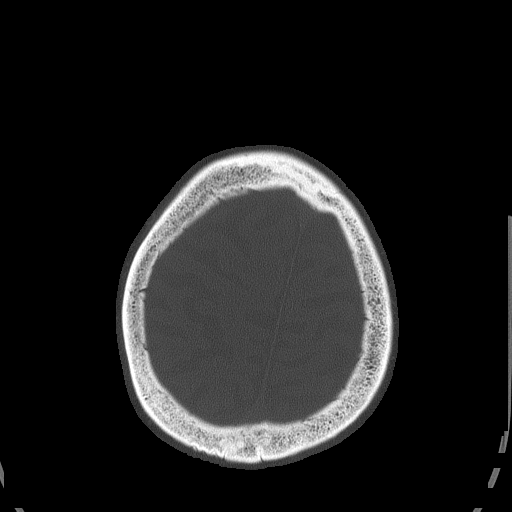
[im 71/79  bone]
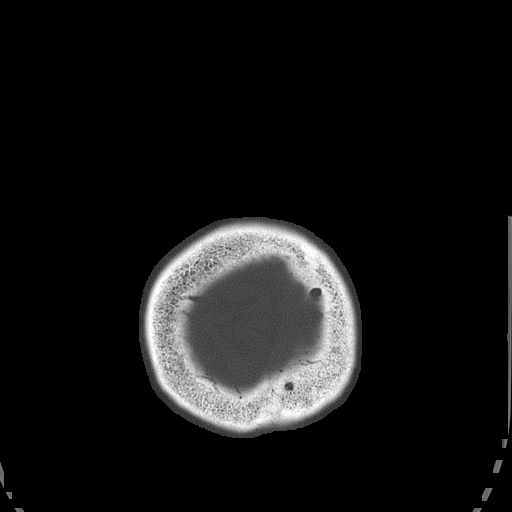

[15 of 30 positions shown; findings below may reference images not displayed]

FINDINGS: Sinuses/Soft tissues: Fluid in the right maxillary sinus. Partial
opacification of ethmoid air cells. Clear mastoid air cells.

Intracranial: Expected cerebral atrophy for age. Moderate low
density in the periventricular white matter likely related to small
vessel disease. Cerebellar atrophy is also expected for age. No mass
lesion, hemorrhage, hydrocephalus, acute infarct, intra-axial, or
extra-axial fluid collection.
IMPRESSION: 1.  No acute intracranial abnormality.
2. Small vessel ischemic change. Expected for age cerebral and
cerebellar atrophy.
3. Sinus disease

## 2015-11-21 ENCOUNTER — Other Ambulatory Visit: Payer: Self-pay | Admitting: Family Medicine

## 2015-11-24 ENCOUNTER — Observation Stay (HOSPITAL_COMMUNITY)
Admission: EM | Admit: 2015-11-24 | Discharge: 2015-11-26 | Disposition: A | Discharge status: Home / Self Care | Payer: Medicare Other | Attending: Family Medicine | Admitting: Family Medicine

## 2015-11-24 ENCOUNTER — Encounter (HOSPITAL_COMMUNITY): Payer: Self-pay | Admitting: *Deleted

## 2015-11-24 ENCOUNTER — Emergency Department (HOSPITAL_COMMUNITY): Payer: Medicare Other

## 2015-11-24 ENCOUNTER — Ambulatory Visit: Payer: Medicare Other | Admitting: Family Medicine

## 2015-11-24 DIAGNOSIS — R531 Weakness: Secondary | ICD-10-CM | POA: Diagnosis present

## 2015-11-24 DIAGNOSIS — Z7902 Long term (current) use of antithrombotics/antiplatelets: Secondary | ICD-10-CM | POA: Diagnosis not present

## 2015-11-24 DIAGNOSIS — M5030 Other cervical disc degeneration, unspecified cervical region: Secondary | ICD-10-CM | POA: Diagnosis not present

## 2015-11-24 DIAGNOSIS — Z7982 Long term (current) use of aspirin: Secondary | ICD-10-CM | POA: Diagnosis not present

## 2015-11-24 DIAGNOSIS — K5790 Diverticulosis of intestine, part unspecified, without perforation or abscess without bleeding: Secondary | ICD-10-CM | POA: Insufficient documentation

## 2015-11-24 DIAGNOSIS — Z79899 Other long term (current) drug therapy: Secondary | ICD-10-CM | POA: Diagnosis not present

## 2015-11-24 DIAGNOSIS — R29898 Other symptoms and signs involving the musculoskeletal system: Secondary | ICD-10-CM

## 2015-11-24 DIAGNOSIS — N390 Urinary tract infection, site not specified: Secondary | ICD-10-CM | POA: Insufficient documentation

## 2015-11-24 DIAGNOSIS — I509 Heart failure, unspecified: Secondary | ICD-10-CM | POA: Diagnosis not present

## 2015-11-24 DIAGNOSIS — Z85038 Personal history of other malignant neoplasm of large intestine: Secondary | ICD-10-CM | POA: Diagnosis not present

## 2015-11-24 DIAGNOSIS — H538 Other visual disturbances: Secondary | ICD-10-CM | POA: Diagnosis not present

## 2015-11-24 DIAGNOSIS — K219 Gastro-esophageal reflux disease without esophagitis: Secondary | ICD-10-CM | POA: Diagnosis not present

## 2015-11-24 DIAGNOSIS — R05 Cough: Secondary | ICD-10-CM | POA: Diagnosis not present

## 2015-11-24 DIAGNOSIS — F17200 Nicotine dependence, unspecified, uncomplicated: Secondary | ICD-10-CM | POA: Diagnosis present

## 2015-11-24 DIAGNOSIS — I5030 Unspecified diastolic (congestive) heart failure: Secondary | ICD-10-CM | POA: Diagnosis present

## 2015-11-24 DIAGNOSIS — R0989 Other specified symptoms and signs involving the circulatory and respiratory systems: Secondary | ICD-10-CM

## 2015-11-24 DIAGNOSIS — I10 Essential (primary) hypertension: Secondary | ICD-10-CM | POA: Insufficient documentation

## 2015-11-24 DIAGNOSIS — D649 Anemia, unspecified: Secondary | ICD-10-CM | POA: Diagnosis not present

## 2015-11-24 DIAGNOSIS — M6281 Muscle weakness (generalized): Secondary | ICD-10-CM | POA: Diagnosis not present

## 2015-11-24 DIAGNOSIS — Z853 Personal history of malignant neoplasm of breast: Secondary | ICD-10-CM | POA: Insufficient documentation

## 2015-11-24 DIAGNOSIS — Z8701 Personal history of pneumonia (recurrent): Secondary | ICD-10-CM | POA: Insufficient documentation

## 2015-11-24 DIAGNOSIS — R079 Chest pain, unspecified: Secondary | ICD-10-CM

## 2015-11-24 DIAGNOSIS — E1140 Type 2 diabetes mellitus with diabetic neuropathy, unspecified: Secondary | ICD-10-CM

## 2015-11-24 DIAGNOSIS — D638 Anemia in other chronic diseases classified elsewhere: Secondary | ICD-10-CM | POA: Diagnosis present

## 2015-11-24 DIAGNOSIS — K449 Diaphragmatic hernia without obstruction or gangrene: Secondary | ICD-10-CM | POA: Insufficient documentation

## 2015-11-24 DIAGNOSIS — I252 Old myocardial infarction: Secondary | ICD-10-CM | POA: Insufficient documentation

## 2015-11-24 DIAGNOSIS — R404 Transient alteration of awareness: Secondary | ICD-10-CM | POA: Diagnosis not present

## 2015-11-24 DIAGNOSIS — E785 Hyperlipidemia, unspecified: Secondary | ICD-10-CM | POA: Diagnosis not present

## 2015-11-24 DIAGNOSIS — I720 Aneurysm of carotid artery: Secondary | ICD-10-CM | POA: Insufficient documentation

## 2015-11-24 DIAGNOSIS — R413 Other amnesia: Secondary | ICD-10-CM | POA: Diagnosis present

## 2015-11-24 DIAGNOSIS — J45909 Unspecified asthma, uncomplicated: Secondary | ICD-10-CM | POA: Diagnosis not present

## 2015-11-24 DIAGNOSIS — I2510 Atherosclerotic heart disease of native coronary artery without angina pectoris: Secondary | ICD-10-CM | POA: Diagnosis not present

## 2015-11-24 LAB — URINE MICROSCOPIC-ADD ON

## 2015-11-24 LAB — BASIC METABOLIC PANEL
Anion gap: 6 (ref 5–15)
BUN: 16 mg/dL (ref 6–20)
CHLORIDE: 110 mmol/L (ref 101–111)
CO2: 25 mmol/L (ref 22–32)
Calcium: 10.6 mg/dL — ABNORMAL HIGH (ref 8.9–10.3)
Creatinine, Ser: 0.96 mg/dL (ref 0.44–1.00)
GFR calc Af Amer: 59 mL/min — ABNORMAL LOW (ref >60)
GFR calc non Af Amer: 51 mL/min — ABNORMAL LOW (ref >60)
GLUCOSE: 201 mg/dL — AB (ref 65–99)
POTASSIUM: 4 mmol/L (ref 3.5–5.1)
Sodium: 141 mmol/L (ref 135–145)

## 2015-11-24 LAB — URINALYSIS, ROUTINE W REFLEX MICROSCOPIC
BILIRUBIN URINE: NEGATIVE
GLUCOSE, UA: 100 mg/dL — AB
KETONES UR: NEGATIVE mg/dL
Nitrite: POSITIVE — AB
PH: 6.5 (ref 5.0–8.0)
Protein, ur: 100 mg/dL — AB
Specific Gravity, Urine: 1.019 (ref 1.005–1.030)

## 2015-11-24 LAB — CBC
HEMATOCRIT: 31.7 % — AB (ref 36.0–46.0)
Hemoglobin: 9.8 g/dL — ABNORMAL LOW (ref 12.0–15.0)
MCH: 22.1 pg — ABNORMAL LOW (ref 26.0–34.0)
MCHC: 30.9 g/dL (ref 30.0–36.0)
MCV: 71.6 fL — AB (ref 78.0–100.0)
Platelets: 161 10*3/uL (ref 150–400)
RBC: 4.43 MIL/uL (ref 3.87–5.11)
RDW: 16.5 % — AB (ref 11.5–15.5)
WBC: 4.5 10*3/uL (ref 4.0–10.5)

## 2015-11-24 LAB — I-STAT TROPONIN, ED: Troponin i, poc: 0.01 ng/mL (ref 0.00–0.08)

## 2015-11-24 MED ORDER — LABETALOL HCL 5 MG/ML IV SOLN
10.0000 mg | Freq: Once | INTRAVENOUS | Status: AC
  Administered 2015-11-25: 10 mg via INTRAVENOUS
  Filled 2015-11-24: qty 4

## 2015-11-24 NOTE — ED Notes (Signed)
MD at bedside. 

## 2015-11-24 NOTE — ED Notes (Signed)
Patient transported to CT 

## 2015-11-24 NOTE — ED Provider Notes (Signed)
TIME SEEN:  By signing my name below, I, [name], attest that this documentation has been prepared under the direction and in the presence of [name], DO. Electronically Signed: Julien [name], ED Scribe. 11/24/2015. 11:29 PM.   CHIEF COMPLAINT: Weakness  HPI:  HPI Comments: [name] is a 82 y.o. female with hx of HTN, HLD, neuropathy, breast cancer with double mastectomy, MI, colon cancer, HTN, CAD, and CHF brought in by ambulance, who presents to the Emergency Department complaining of constant, moderate, gradual worsening bilateral leg weakness onset one week ago. Pt endorses that her right leg is more weak than her left. She reports feeling dizzy and telling her daughter she did not feel like herself today. Has felt like her vision was blurry for the last day in both eyes.  No vision loss.  No HA.  Pt notes she had an appointment with her doctor yesterday but missed her appointment due to snow for her symptoms. She currently lives with her daughter and uses a walker to help her ambulate. She states she has chronic neuropathy in her feet.  She endorses one week of productive cough that brings up white sputum.     She endorses having intermittent chest pain that has been going on for awhile now. States this has been going on for "years."  No pain in chest currently.   States she has been vomiting several times today.    Pt denies any new numbness, fever, chills, diarrhea, new shortness of breath, dysuria, hematuria, loss of appetite, blood in stool, black/tarry stools, falls or head injuries.  ROS: See HPI Constitutional: no fever  Eyes: no drainage  ENT: no runny nose   Cardiovascular:   chest pain  Resp: no SOB  GI: vomiting GU: no dysuria Integumentary: no rash  Allergy: no hives  Musculoskeletal: no leg swelling  Neurological: no slurred speech ROS otherwise negative  PAST MEDICAL HISTORY/PAST SURGICAL HISTORY:  Past Medical History  Diagnosis Date  .  Breast cancer ([place]) 10/2010    Invasive Ductal Carcinoma, s/p bilateral mastectomy, now on Tamoxifen. Followed by Dr Jamse Arn.   . Diabetes mellitus   . Hypertension   . Hyperlipidemia   . Anemia     Iron def anemia  . CAD (coronary artery disease)   . CHF (congestive heart failure) (H[facility])     Systolic   . History of colon cancer   . Neuropathy ([place])   . Allergy   . GERD (gastroesophageal reflux disease)   . Hiatal hernia   . Diverticulosis   . Asthma   . Arthritis   . H/O: GI bleed   . Breast cancer ([place]) 2011    s/p Bilateral masectomy  . Shortness of breath   . Pneumonia 03/2012  . Carotid artery aneurysm ([place]) 08/2010    right ICA, 5 x 55m  . DDD (degenerative disc disease), cervical   . [place]   . Colon cancer [place] 12/11/2013  . [place] [place]   . Hypercalcemia 06/09/2014    MEDICATIONS:  Prior to Admission medications   Medication Sig Start Date End Date Taking? Authorizing Provider  acetaminophen (TYLENOL) 500 MG tablet Take 500 mg by mouth every 6 (six) hours as needed for moderate pain (pain).    Historical Provider, MD  albuterol (PROVENTIL HFA;VENTOLIN HFA) 108 (90 BASE) MCG/ACT inhaler Inhale 2 puffs into the lungs every 6 (six) hours as needed. For shortness of breath 01/18/15   CArchie Patten MD  aspirin (ASPIRIN CHILDRENS) 81 MG chewable tablet Chew 81 mg by mouth daily.      Historical Provider, MD  Blood Pressure KIT Use the blood pressure kit to check you blood pressure twice a day 08/27/15   [name], MD  carvedilol (COREG) 12.5 MG tablet TAKE 1 TABLET BY MOUTH TWICE A DAY WITH FOOD 11/23/15   [name], MD  clopidogrel (PLAVIX) 75 MG tablet TAKE 1 TABLET (75 MG TOTAL) BY MOUTH DAILY. 06/01/15   [name], MD  colchicine 0.6 MG tablet Take 2 tabs by mouth for gout, then 1 tab in one hour. Do not take more for 3 days. 05/14/15   Olin Hauser, DO  diclofenac sodium (VOLTAREN) 1 % GEL APPLY 2 grams topically 4  TIMES DAILY AS NEEDED FOR PAIN 09/23/15   [name], MD  gabapentin (NEURONTIN) 300 MG capsule TAKE ONE CAPSULE BY MOUTH TWICE A DAY 11/23/15   [name], MD  glucose blood test strip Use as instructed Dx. Code 250.02 09/28/14   [name], MD  loratadine (CLARITIN) 10 MG tablet TAKE 1/2 TABLET BY MOUTH EVERY DAY 11/01/15   [name], MD  losartan (COZAAR) 50 MG tablet TAKE 1 TABLET (50 MG TOTAL) BY MOUTH AT BEDTIME. 09/20/15   [name], MD  magnesium hydroxide (MILK OF MAGNESIA) 400 MG/5ML suspension Take 30 mLs by mouth daily as needed for mild constipation.    Historical Provider, MD  PATADAY 0.2 % SOLN APPLY 1 DROP TO EYE DAILY. 11/23/15   [name], MD  ranitidine (ZANTAC) 150 MG capsule TAKE 1 CAPSULE BY MOUTH 2 TIMES DAILY AS NEEDED FOR heartburn 09/23/15   [name], MD  rosuvastatin (CRESTOR) 20 MG tablet Take 1 tablet (20 mg total) by mouth at bedtime. 08/25/15   [name], MD  tamoxifen (NOLVADEX) 20 MG tablet TAKE 1 TABLET (20 MG TOTAL) BY MOUTH DAILY. 10/18/15   [name], MD  traMADol (ULTRAM) 50 MG tablet Take 1 tablet (50 mg total) by mouth every 8 (eight) hours as needed for severe pain. 11/03/15   [name], MD  VOLTAREN 1 % GEL APPLY 2 GRAMS TOPICALLY 4 (FOUR) TIMES DAILY AS NEEDED FOR PAIN 07/08/15   [name], MD    ALLERGIES:  Allergies  Allergen Reactions  . Peanuts [Nuts] Swelling  . Lisinopril Cough    SOCIAL HISTORY:  Social History  Substance Use Topics  . Smoking status: Never Smoker   . Smokeless tobacco: Current User    Types: Snuff  . Alcohol Use: No    FAMILY HISTORY: Family History  Problem Relation Age of Onset  . Sickle cell anemia Other   . Heart disease Mother   . Heart disease Father   . Cancer Daughter 57    breast ca  . Hypertension Daughter   . Cancer Daughter 14    breast ca  . Hypertension Daughter   . Heart disease Son     Poor circulation-Left Leg  .  Diabetes Daughter   . Hypertension Daughter   . Hyperlipidemia Daughter     Poor circulation- Toe amputation    EXAM: Triage vitals: BP 197/74 mmHg  Pulse 73  Temp(Src) 98.3 F (36.8 C) (Oral)  Resp 21  Ht 4' 11" (1.499 m)  Wt 114 lb (51.71 kg)  BMI 23.01 kg/m2  SpO2 100% CONSTITUTIONAL: Alert and oriented and responds appropriately to questions. Elderly and  no distress, alert x3 HEAD: Normocephalic EYES: Conjunctivae clear, PERRL, no conjunctival pallor; EOMI, unable to test visual fields due to poor patient comprehension ENT: normal nose; no rhinorrhea; moist mucous membranes; pharynx without lesions noted NECK: Supple, no meningismus, no LAD  CARD: RRR; S1 and S2 appreciated; no murmurs, no clicks, no rubs, no gallops RESP: Normal chest excursion without splinting or tachypnea; breath sounds clear and equal bilaterally; no wheezes, no rhonchi, no rales, no hypoxia or respiratory distress, speaking full sentences ABD/GI: Normal bowel sounds; non-distended; soft, non-tender, no rebound, no guarding, no peritoneal signs BACK:  The back appears normal and is non-tender to palpation, there is no CVA tenderness EXT: Normal ROM in all joints; non-tender to palpation; no edema; normal capillary refill; no cyanosis, no calf tenderness or swelling    SKIN: Normal color for age and race; warm NEURO: Moves all extremities equally, sensation to light touch intact diffusely, cranial nerves II through XII intact, weakness in right leg compared to the left PSYCH: The patient's mood and manner are appropriate. Grooming and personal hygiene are appropriate.  MEDICAL DECISION MAKING: Patient here with multiple complaints. She is noted to be hypertensive and states that she took her blood pressure medication today. Appears she is on Coreg and Cozaar. She complains of feeling like her right leg is weaker than the left of the past week it does appear weaker on exam. She has chronic neuropathy but no other  paresthesias, numbness. Otherwise neurologically intact. Concern for possible stroke, hypertensive urgency. We'll obtain CT of her head and give IV labetalol. Patient also complaining of feeling generalized weakness today. Will obtain basic labs to evaluate for electrolyte abnormalities, anemia. We'll also obtain chest x-ray given her week of productive cough. Will obtain urinalysis to evaluate for UTI. Patient reports that she is normally able to ambulate with a walker but has been unable to do so today because of this weakness in her leg. I feel she will likely need admission. [facility] PCP [facility] with [facility].  ED PROGRESS:   Patient's labs are unremarkable. She is anemic but this appears to be her baseline. She does have a nitrite-positive UTI. Culture pending. We'll give ceftriaxone. Troponin negative. Head CT shows atrophy and chronic small vessel ischemia without acute abnormality. Chest x-ray is clear. Patient has been able to ambulate with assistance. On her exam she does appear to have increased weakness in the right leg compared to the left. Concern for possible subacute stroke. We'll discuss with neurology on call.   1:30 AM  D/w Dr. Janann Colonel on call with neurology.  He recommends obtaining an MRI of patient's brain. He states her old MRI appears to show lacunar infarcts and he agrees that her UTI may be exacerbating previous stroke symptoms. He recommends if MRI is positive that neurology can be consult as an inpatient. We'll discuss with family medicine for admission.    1:40 AM  Spoke with Caryl Pina with FM.  Rory Percy. I will place holding orders per her request. Patient and daughter at bedside updated with plan. Patient's blood pressure is improving with IV labetalol.     EKG Interpretation  Date/Time:  Wednesday November 24 2015 22:46:46 EST Ventricular Rate:  73 PR Interval:  177 QRS Duration: 76 QT Interval:  417 QTC Calculation: 459 R Axis:   3 Text Interpretation:   Sinus rhythm nonspecific st changes resolved sicne last tracing Confirmed by KNAPP  MD-J, JON ([id number]) on 11/24/2015 11:04:01 PM  I personally performed the services described in this documentation, which was scribed in my presence. The recorded information has been reviewed and is accurate.   [place], DO 11/25/15 [phone]

## 2015-11-24 NOTE — ED Notes (Signed)
Pt arrives from home via EMS with generalized weakness and cough for several days. En route, pt developed left sided cp (pt was given asa 324, nsr on ekg without changes) that resolved.  Pressure 218/106, hr 80's, rr 30 en route. cbg 178

## 2015-11-25 ENCOUNTER — Observation Stay (HOSPITAL_COMMUNITY): Payer: Medicare Other

## 2015-11-25 DIAGNOSIS — N390 Urinary tract infection, site not specified: Secondary | ICD-10-CM | POA: Diagnosis present

## 2015-11-25 DIAGNOSIS — R29898 Other symptoms and signs involving the musculoskeletal system: Secondary | ICD-10-CM | POA: Diagnosis present

## 2015-11-25 DIAGNOSIS — R079 Chest pain, unspecified: Secondary | ICD-10-CM

## 2015-11-25 DIAGNOSIS — I10 Essential (primary) hypertension: Secondary | ICD-10-CM | POA: Diagnosis not present

## 2015-11-25 DIAGNOSIS — R531 Weakness: Secondary | ICD-10-CM | POA: Diagnosis not present

## 2015-11-25 LAB — CBG MONITORING, ED: Glucose-Capillary: 137 mg/dL — ABNORMAL HIGH (ref 65–99)

## 2015-11-25 LAB — COMPREHENSIVE METABOLIC PANEL
ALBUMIN: 2.8 g/dL — AB (ref 3.5–5.0)
ALK PHOS: 44 U/L (ref 38–126)
ALT: 8 U/L — ABNORMAL LOW (ref 14–54)
AST: 14 U/L — AB (ref 15–41)
Anion gap: 8 (ref 5–15)
BILIRUBIN TOTAL: 0.2 mg/dL — AB (ref 0.3–1.2)
BUN: 16 mg/dL (ref 6–20)
CALCIUM: 10.1 mg/dL (ref 8.9–10.3)
CO2: 22 mmol/L (ref 22–32)
Chloride: 111 mmol/L (ref 101–111)
Creatinine, Ser: 0.98 mg/dL (ref 0.44–1.00)
GFR calc Af Amer: 58 mL/min — ABNORMAL LOW (ref >60)
GFR calc non Af Amer: 50 mL/min — ABNORMAL LOW (ref >60)
GLUCOSE: 128 mg/dL — AB (ref 65–99)
Potassium: 4 mmol/L (ref 3.5–5.1)
Sodium: 141 mmol/L (ref 135–145)
TOTAL PROTEIN: 6.4 g/dL — AB (ref 6.5–8.1)

## 2015-11-25 LAB — CBC
HCT: 29.1 % — ABNORMAL LOW (ref 36.0–46.0)
Hemoglobin: 9.3 g/dL — ABNORMAL LOW (ref 12.0–15.0)
MCH: 22.9 pg — AB (ref 26.0–34.0)
MCHC: 32 g/dL (ref 30.0–36.0)
MCV: 71.7 fL — AB (ref 78.0–100.0)
PLATELETS: 148 10*3/uL — AB (ref 150–400)
RBC: 4.06 MIL/uL (ref 3.87–5.11)
RDW: 16.6 % — AB (ref 11.5–15.5)
WBC: 4.1 10*3/uL (ref 4.0–10.5)

## 2015-11-25 LAB — CREATININE, SERUM
Creatinine, Ser: 0.96 mg/dL (ref 0.44–1.00)
GFR calc Af Amer: 59 mL/min — ABNORMAL LOW (ref >60)
GFR calc non Af Amer: 51 mL/min — ABNORMAL LOW (ref >60)

## 2015-11-25 LAB — GLUCOSE, CAPILLARY
GLUCOSE-CAPILLARY: 166 mg/dL — AB (ref 65–99)
GLUCOSE-CAPILLARY: 126 mg/dL — AB (ref 65–99)

## 2015-11-25 LAB — TROPONIN I: Troponin I: <0.03 ng/mL (ref <0.031)

## 2015-11-25 MED ORDER — SODIUM CHLORIDE 0.9 % IJ SOLN
3.0000 mL | Freq: Two times a day (BID) | INTRAMUSCULAR | Status: DC
  Administered 2015-11-25 – 2015-11-26 (×2): 3 mL via INTRAVENOUS

## 2015-11-25 MED ORDER — INSULIN ASPART 100 UNIT/ML ~~LOC~~ SOLN
0.0000 [IU] | Freq: Three times a day (TID) | SUBCUTANEOUS | Status: DC
  Administered 2015-11-25 – 2015-11-26 (×2): 2 [IU] via SUBCUTANEOUS

## 2015-11-25 MED ORDER — LOSARTAN POTASSIUM 50 MG PO TABS
50.0000 mg | ORAL_TABLET | Freq: Every day | ORAL | Status: DC
  Administered 2015-11-25 – 2015-11-26 (×2): 50 mg via ORAL
  Filled 2015-11-25 (×2): qty 1

## 2015-11-25 MED ORDER — LORATADINE 10 MG PO TABS
5.0000 mg | ORAL_TABLET | Freq: Every day | ORAL | Status: DC
  Administered 2015-11-25 – 2015-11-26 (×2): 5 mg via ORAL
  Filled 2015-11-25 (×2): qty 1

## 2015-11-25 MED ORDER — CLOPIDOGREL BISULFATE 75 MG PO TABS
75.0000 mg | ORAL_TABLET | Freq: Once | ORAL | Status: AC
  Administered 2015-11-26: 75 mg via ORAL
  Filled 2015-11-25 (×2): qty 1

## 2015-11-25 MED ORDER — INSULIN ASPART 100 UNIT/ML ~~LOC~~ SOLN: 0.0000 [IU] | Freq: Every day | SUBCUTANEOUS | Status: DC

## 2015-11-25 MED ORDER — TRAMADOL HCL 50 MG PO TABS
50.0000 mg | ORAL_TABLET | Freq: Three times a day (TID) | ORAL | Status: DC | PRN
Start: 2015-11-25 — End: 2015-11-26

## 2015-11-25 MED ORDER — GABAPENTIN 300 MG PO CAPS
300.0000 mg | ORAL_CAPSULE | Freq: Two times a day (BID) | ORAL | Status: DC
  Administered 2015-11-25 – 2015-11-26 (×3): 300 mg via ORAL
  Filled 2015-11-25 (×3): qty 1

## 2015-11-25 MED ORDER — TAMOXIFEN CITRATE 10 MG PO TABS
20.0000 mg | ORAL_TABLET | Freq: Every day | ORAL | Status: DC
  Administered 2015-11-26: 20 mg via ORAL
  Filled 2015-11-25: qty 2

## 2015-11-25 MED ORDER — LABETALOL HCL 5 MG/ML IV SOLN
10.0000 mg | Freq: Once | INTRAVENOUS | Status: AC
  Administered 2015-11-25: 10 mg via INTRAVENOUS
  Filled 2015-11-25: qty 4

## 2015-11-25 MED ORDER — CARVEDILOL 12.5 MG PO TABS
12.5000 mg | ORAL_TABLET | Freq: Two times a day (BID) | ORAL | Status: DC
  Administered 2015-11-25 – 2015-11-26 (×2): 12.5 mg via ORAL
  Filled 2015-11-25 (×2): qty 1

## 2015-11-25 MED ORDER — DEXTROSE 5 % IV SOLN
1.0000 g | INTRAVENOUS | Status: DC
  Filled 2015-11-25: qty 10

## 2015-11-25 MED ORDER — ASPIRIN 81 MG PO CHEW
81.0000 mg | CHEWABLE_TABLET | Freq: Every day | ORAL | Status: DC
  Administered 2015-11-25 – 2015-11-26 (×2): 81 mg via ORAL
  Filled 2015-11-25 (×2): qty 1

## 2015-11-25 MED ORDER — DEXTROSE 5 % IV SOLN
1.0000 g | Freq: Once | INTRAVENOUS | Status: AC
  Administered 2015-11-25: 1 g via INTRAVENOUS
  Filled 2015-11-25: qty 10

## 2015-11-25 MED ORDER — DEXTROSE 5 % IV SOLN
1.0000 g | INTRAVENOUS | Status: DC
Start: 2015-11-25 — End: 2015-11-25

## 2015-11-25 MED ORDER — HEPARIN SODIUM (PORCINE) 5000 UNIT/ML IJ SOLN
5000.0000 [IU] | Freq: Three times a day (TID) | INTRAMUSCULAR | Status: DC
  Administered 2015-11-25 – 2015-11-26 (×3): 5000 [IU] via SUBCUTANEOUS
  Filled 2015-11-25 (×2): qty 1

## 2015-11-25 MED ORDER — CEPHALEXIN 500 MG PO CAPS
500.0000 mg | ORAL_CAPSULE | Freq: Three times a day (TID) | ORAL | Status: DC
  Administered 2015-11-25 – 2015-11-26 (×4): 500 mg via ORAL
  Filled 2015-11-25 (×3): qty 1

## 2015-11-25 MED ORDER — ROSUVASTATIN CALCIUM 20 MG PO TABS: 20.0000 mg | ORAL_TABLET | Freq: Every day | ORAL | Status: DC

## 2015-11-25 MED ORDER — NICOTINE 7 MG/24HR TD PT24: 7.0000 mg | MEDICATED_PATCH | Freq: Every day | TRANSDERMAL | Status: DC | PRN

## 2015-11-25 MED ORDER — TAMOXIFEN CITRATE 20 MG PO TABS: 20.0000 mg | ORAL_TABLET | Freq: Every day | ORAL | Status: DC

## 2015-11-25 MED ORDER — CARVEDILOL 12.5 MG PO TABS: 12.5000 mg | ORAL_TABLET | Freq: Two times a day (BID) | ORAL | Status: DC

## 2015-11-25 NOTE — ED Notes (Signed)
Pt states she normally uses a walker to ambulate. Pt ambulated with staff member x2. Pt with steady gait with assistance. She says she still feels like her legs are weak, denies dizziness or feeling lightheaded.

## 2015-11-25 NOTE — ED Notes (Signed)
Patient transported to MRI 

## 2015-11-25 NOTE — Progress Notes (Signed)
Interim Progress Note:  S: Pt states she is doing well this morning. She says she does not know if her right lower extremity is still feeling weak, because she has not gotten up to walk. She has no concerns this morning.  O:  General: Sleeping, but easily arousable, in NAD, answers questions appropriately HEENT: [place]/AT, EOMI, MMM Neck: supple Cardiovascular: RRR, no mumurs Respiratory: CTAB, normal work of breathing on RA Abdomen: flat, soft, NT/ND, +BS MSK: WWP, thin, no edema Skin: No rashes or lesions Neuro: Able to follow commands, AOx3, CN 2-12 grossly intact. 4/5 UE and LE strength bilaterally. Light touch sensation grossly intact. Psych: mood stable, speech normal  CMP: Na 141, K 4.0, Chl 111, CO2 22, BUN 16, Cr 0.98, Ca 10.1, EGFR 58, glucose 128, AG 8, Alk phos 44, Albumin 2.8, AST 14, ALT 8, total protein 6.4, total bili 0.2 Urine culture: pending MRI brain: No acute intracranial infarct, multiple remote lacunar infarct  A/P: [name] is a 81 y.o. female presenting with increased RLE weakness . PMH is significant for lacunar infarct, HFpEF, DM2, HTN, tobacco use d/o, CAD, asthma, anemia of chronic disease, breast cancer s/p mastectomy 2011  Right lower extremity weakness:  - Bedside swallow eval then HH diet - Continue ASA, statin, gabapentin, and tramadol prn severe pain - PT/OT consult - ABIs for decreased LE pulses - CM consult to help resume [place] aide at discharge  UTI: UA with large leuks, + nitrites, >100 glucose and protein, many bacteria. Denies dysuria, but endorses urinary frequency and urgency. - s/p Rocephin in ED - Urine culture pending - Will treat with Keflex '500mg'$  qid x 7 days  DM2: A1c 06/2015 was 6.5% - SSI sensitive, CBG monitoring  CAD/ HFpEF/ HTN: BP elevated on admission to 193/55. - Coreg and Cozaar held on admission for permissive HTN in the setting of possible stroke. Will restart today.  Disposition: Discharge today after she is seen by  PT/OT.   Hyman Bible, MD PGY-1

## 2015-11-25 NOTE — Progress Notes (Signed)
Pt's BP checked at 219/68. Rechecked in opposite arm at 216/62. MD notified. Orders received. Will continue to monitor.

## 2015-11-25 NOTE — ED Notes (Signed)
Patient returned from MRI and placed on cardiac monitor.

## 2015-11-25 NOTE — Progress Notes (Signed)
Arrival Method: via stretcher  Mental Status: alert and oriented x4 Telemetry: applied and CCMD notified. Verified by RN and NT Skin: intact Tubes: n/a IV: RAC Pain: denies Family: daughter present Living Situation: home with daughter  Safety Measures: bed in lowest position, call bell in reach, bed alarm on, non skid socks on 6E Orientation: oriented to staff and unit.    Pt's BP upon arrival to floor elevated at 181/68. MD aware. Will continue to monitor.

## 2015-11-25 NOTE — ED Notes (Signed)
MD at bedside. 

## 2015-11-25 NOTE — ED Notes (Signed)
Medication crushed and given with apple sauce per pt request.

## 2015-11-25 NOTE — Evaluation (Addendum)
Physical Therapy Evaluation Patient Details Name: [name] MRN: [id number] DOB: 1923/12/11 Today's Date: 11/25/2015   History of Present Illness  [name] is a 82 y.o. female presenting with increased RLE weakness . PMH is significant for lacunar infarct, HFpEF, DM2, HTN, tobacco use d/o, CAD, asthma, anemia of chronic disease, breast cancer s/p mastectomy 2011  Clinical Impression  Pt admitted with above diagnosis. Pt currently with functional limitations due to the deficits listed below (see PT Problem List). Pt was able to ambulate in hallway with RW with good balance overall. Feel pt is most likely close to baseline and as long as daughter can provide supervision should be able to d/c home with daughter once medically ready.  See Recommendations for f/u and equipment below.  Will follow acutely.  Pt will benefit from skilled PT to increase their independence and safety with mobility to allow discharge to the venue listed below.      Follow Up Recommendations Home health PT;Supervision/Assistance - 24 hour (HHOT and HHAide)    Equipment Recommendations  Wheelchair (measurements PT);Wheelchair cushion (measurements PT) (rollator with seat (pt has had her RW since 2002))    Recommendations for Other Services       Precautions / Restrictions Precautions Precautions: Fall Restrictions Weight Bearing Restrictions: No      Mobility  Bed Mobility Overal bed mobility: Needs Assistance Bed Mobility: Supine to Sit     Supine to sit: Supervision;Min guard     General bed mobility comments: Pt was on bedpan on arrival.  Assisted pt off bedpan with pt not needing assist to roll.  only needed help due to on stretcher  Transfers Overall transfer level: Needs assistance Equipment used: Rolling walker (2 wheeled) Transfers: Sit to/from Stand Sit to Stand: Min guard         General transfer comment: guard assist due to pt being on stretcher.     Ambulation/Gait Ambulation/Gait assistance: Supervision Ambulation Distance (Feet): 75 Feet Assistive device: Rolling walker (2 wheeled) Gait Pattern/deviations: Step-through pattern;Decreased stride length;Shuffle;Trunk flexed   Gait velocity interpretation: Below normal speed for age/gender General Gait Details: Pt ambulated to 3N1 and urinated in 3N1 and was able to wipe herself.  Then ambulated in hall.  Overall, pt appears to be at baseline.  Safe use of RW in controlled environment.   NoLOB and occasional cues needed for staying close to RW.    Stairs            Wheelchair Mobility    Modified Rankin (Stroke Patients Only)       Balance Overall balance assessment: Needs assistance Sitting-balance support: No upper extremity supported;Feet supported Sitting balance-Leahy Scale: Fair     Standing balance support: Bilateral upper extremity supported;During functional activity Standing balance-Leahy Scale: Fair Standing balance comment: can stand statically without bil UE support for brief periods of time.               High level balance activites: Direction changes;Turns High Level Balance Comments: supervision to min guard for above.             Pertinent Vitals/Pain Pain Assessment: Faces Faces Pain Scale: Hurts even more Pain Location: back and right LE Pain Descriptors / Indicators: Grimacing;Guarding;Sore Pain Intervention(s): Limited activity within patient's tolerance;Monitored during session;Repositioned  BP 190/60 on arrival.  Retook BP at 96/78.  BP once pt ambulated and returned to stretcher was 207/63.  Notified nurse of fluctuating BP.      Home Living Family/patient expects  to be discharged to:: Private residence Living Arrangements: Children ( daughter lives with her.) Available Help at Discharge: Family;Available PRN/intermittently;Personal care attendant Type of Home: House Home Access: Stairs to enter Entrance Stairs-Rails:  Right Entrance Stairs-Number of Steps: 3 Home Layout: One level Home Equipment: Walker - 2 wheels;Walker - 4 wheels;Cane - single point;Bedside commode;Shower seat;Tub bench Additional Comments: Daughter can provide supervision only due to her own health problems.      Prior Function Level of Independence: Needs assistance   Gait / Transfers Assistance Needed: Pt requires S for gait in house with use of RW or cane  ADL's / Homemaking Assistance Needed: Pt has aide who comes in and helps 3hrs per day mon-friday; family helps with homemaking and meals         Hand Dominance   Dominant Hand: Right    Extremity/Trunk Assessment   Upper Extremity Assessment: Defer to OT evaluation           Lower Extremity Assessment: Generalized weakness      Cervical / Trunk Assessment: Kyphotic  Communication   Communication: No difficulties  Cognition Arousal/Alertness: Awake/alert Behavior During Therapy: WFL for tasks assessed/performed Overall Cognitive Status: Within Functional Limits for tasks assessed                      General Comments      Exercises        Assessment/Plan    PT Assessment Patient needs continued PT services  PT Diagnosis Generalized weakness   PT Problem List Decreased activity tolerance;Decreased balance;Decreased mobility;Decreased knowledge of use of DME;Decreased safety awareness;Decreased knowledge of precautions  PT Treatment Interventions DME instruction;Gait training;Functional mobility training;Stair training;Therapeutic activities;Therapeutic exercise;Balance training;Patient/family education   PT Goals (Current goals can be found in the Care Plan section) Acute Rehab PT Goals Patient Stated Goal: to get better PT Goal Formulation: With patient Time For Goal Achievement: 12/09/15 Potential to Achieve Goals: Good    Frequency Min 3X/week   Barriers to discharge Decreased caregiver support (daughter can provide supervision  only)      Co-evaluation               End of Session Equipment Utilized During Treatment: Gait belt Activity Tolerance: Patient limited by fatigue Patient left: in bed;with call bell/phone within reach Nurse Communication: Mobility status    Functional Assessment Tool Used: clinical judgment Functional Limitation: Mobility: Walking and moving around Mobility: Walking and Moving Around Current Status [id number]): At least 1 percent but less than 20 percent impaired, limited or restricted Mobility: Walking and Moving Around Goal Status [phone]): At least 1 percent but less than 20 percent impaired, limited or restricted    Time: [id number] PT Time Calculation (min) (ACUTE ONLY): 23 min   Charges:   PT Evaluation $PT Eval Low Complexity: 1 Procedure PT Treatments $Gait Training: 8-22 mins   PT G Codes:   PT G-Codes **NOT FOR INPATIENT CLASS** Functional Assessment Tool Used: clinical judgment Functional Limitation: Mobility: Walking and moving around Mobility: Walking and Moving Around Current Status [id number]): At least 1 percent but less than 20 percent impaired, limited or restricted Mobility: Walking and Moving Around Goal Status [phone]): At least 1 percent but less than 20 percent impaired, limited or restricted    Irwin Brakeman F 11/25/2015, 11:41 AM [facility] [phone] (pager)

## 2015-11-25 NOTE — Care Management Note (Signed)
Case Management Note  Patient Details  Name: [name] MRN: [id number] Date of Birth: [birth year]  Subjective/Objective:                  82 y.o. female presenting with increased RLE weakness.//Home with family  Action/Plan: Follow for disposition needs.   Expected Discharge Date:     11/25/15             Expected Discharge Plan:  [place]  In-House Referral:     Discharge planning Services  CM Consult  Post Acute Care Choice:  Durable Medical Equipment, Home Health Choice offered to:  Patient  DME Arranged:  Wheelchair manual DME Agency:  [place]:  RN, PT, OT, Nurse's Aide [place] Agency:  [place]  Status of Service:  Completed, signed off  Medicare Important Message Given:    Date Medicare IM Given:    Medicare IM give by:    Date Additional Medicare IM Given:    Additional Medicare Important Message give by:     If discussed at [place] of Stay Meetings, dates discussed:    Additional Comments: Hopelynn Gartland J. Clydene Laming, RN, BSN, [employer] [phone] Spoke with pt at bedside regarding discharge planning for [facility]. Offered pt list of home health agencies to choose from.  Pt chose [facility] to render services. Corliss Blacker, RN of [facility] notified.  No DME needs identified at this time.  Fuller Mandril, RN 11/25/2015, 3:15 PM

## 2015-11-25 NOTE — H&P (Signed)
[facility] Admission History and Physical Service Pager: [phone]  [name] Medical record number: [id number] Date of birth: 22-Apr-1924 Age: 82 y.o. Gender: female  Primary Care Provider: Kathrine Cords, MD Consultants: none Code Status: FULL (Discussed on admission)  Chief Complaint: R LE weakness, UTI  Assessment and Plan: [name] is a 82 y.o. female presenting with increased RLE weakness . PMH is significant for lacunar infarct, HFpEF, DM2, HTN, tobacco use d/o, CAD, asthma, anemia of chronic disease, breast cancer s/p mastectomy 2011  # Right lower extremity weakness: Also has pain.  Patient w/ h/o lacunar infarcts in past.  Neuro consulted by EDP, who recommends MRI brain.  If negative, no further w/u needed.  CT head without acute processes.  BP elevated 193/55.  VS otherwise, stable.  No leukocytosis. BMP unremarkable. - Place in observation, telemetry, under Dr Ardelia Mems - VS per floor protocol - Permissive HTN for now - MRI brain ordered - ASA daily - CBC in am - Bedside swallow eval then [facility] diet - c/s to Neuro if MRI is revealing of acute processes - continue ASA, statin, gabapentin, and tramadol prn severe pain (would recommend limiting use of this medication given age) - PT eval - C/s CM to help resume [place] aide at discharge  # UTI: UA with large leuks, + nitrites, >100 glucose and protein, many bacteria - s/p Rocephin in ED. - urine culture ordered - Continue Rocephin daily until speciation.  # DM2: diet controlled outpatient. A1c 06/2015 was 6.5 - SSI sensitive, CBG monitoring - Could likely discontinue above if stable  # CAD/ HFpEF/ HTN: BP elevated on admission to 193/55.  On Coreg, Cozaar outpatient - Hold home meds for now - Permissive HTN for now.    # Tobacco use d/o: uses chewing tobacco daily - Nicotine patch prn - Tobacco cessation counseling  # Anemia of chronic disease: hgb 9.8 (stable, b/l  8-10)  # Breast cancer w/ bilateral mastectomy 2011.  Tamoxifen listed on home meds. - Will need to continue this once med rec'd if she is still taking outpatient.  FEN/GI: HH diet after swallow eval Prophylaxis: hep sub-q  Disposition: Observation, tele.  Discharge if MRI negative tomorrow.  History of Present Illness:  DAWANDA MAPEL is a 82 y.o. female presenting with RLE weakness  Majority of history is provided by daughter, Katy Apo.  She notes that her mother has had LE weakness for about 1 month.  She was seen by PCP for this in December per daughter, but no record of this in EMR.  She was planning on f/u in 2 weeks but LE weakness worsened over the last week so they came in for evaluation.  Denies CP, SOB, abdominal pain, fevers, chills, dysuria, nausea, vomiting, confusion, falls, numbness or tingling, slurred speech, facial drooping, urinary retention or fecal incontinence.  She does note that she has had an intermittent headache for several days.  Patient denies headache currently.  Additionally, she has received all of her night time meds.  Daughter notes that patient lives at home with her.  She is dependent upon an aide to help with ADLs and uses a walker for ambulation.  She actively uses chewing tobacco but no other substances.  Review Of Systems: Per HPI with the following additions: none Otherwise the remainder of the systems were negative.  Patient Active Problem List   Diagnosis Date Noted  . Dry eye 10/28/2015  . Pyelonephritis 09/16/2015  . (HFpEF)  heart failure with preserved ejection fraction ([place])   . CAD in native artery   . Emesis, persistent   . Chest congestion 09/08/2015  . Gout 05/14/2015  . UTI (urinary tract infection) 04/15/2015  . Acute delirium 04/15/2015  . Fever 04/07/2015  . Hypertension 04/07/2015  . Dyslipidemia   . Essential hypertension   . Seasonal allergies 02/10/2015  . Nausea with vomiting 02/10/2015  . Flank pain 02/10/2015  .  Stroke-like symptoms   . Stroke-like episode ([place]) 01/25/2015  . Syncope and collapse 01/25/2015  . Accelerated hypertension   . Hypertensive emergency 09/19/2014  . Anemia of chronic illness 08/19/2014  . Acute superficial venous thrombosis of right lower extremity 08/19/2014  . Hypercalcemia 06/09/2014  . Breast cancer, left breast ([place]) 06/09/2014  . Breast cancer, right breast ([place]) 06/09/2014  . Edema of left lower extremity 03/30/2014  . PAD (peripheral artery disease) ([place]) 03/30/2014  . Colon cancer ([place]) 12/11/2013  . Neck pain 10/22/2013  . History of fall 05/15/2013  . Memory deficit 05/15/2013  . Bilateral leg pain 03/07/2013  . Trigger thumb of right hand 03/07/2013  . Lower extremity edema 02/19/2013  . Stricture and stenosis of esophagus 10/27/2012  . Poor social situation 09/13/2012  . Low back pain 08/07/2012  . Musculoskeletal chest pain 07/10/2012  . Thoracic back pain 05/24/2012  . Weight loss 04/03/2012  . Insomnia 09/25/2011  . Tobacco abuse 06/02/2011  . History of breast cancer in female, bilateral.  Mastectomies 10/24/2010. 04/26/2011  . Hiatal hernia 03/22/2011  . Colon polyp 03/22/2011  . Poor circulation 03/22/2011  . 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 NEUROPATHY 08/25/2009  . ANEMIA-IRON DEFICIENCY 04/29/2007  . HLD (hyperlipidemia) 01/29/2007  . Diabetes type 2, controlled ([place]) 01/10/2007  . HYPERTENSION, BENIGN SYSTEMIC 01/10/2007  . Coronary atherosclerosis 01/10/2007  . Chronic systolic heart failure ([place]) 01/10/2007  . Asthma 01/10/2007  . Reflux esophagitis 01/10/2007  . Osteoarthritis, multiple sites 01/10/2007    Past Medical History: Past Medical History  Diagnosis Date  . Breast cancer ([place]) 10/2010    Invasive Ductal Carcinoma, s/p bilateral mastectomy, now on Tamoxifen. Followed by Dr Jamse Arn.   . Diabetes mellitus   . Hypertension   . Hyperlipidemia   . Anemia     Iron def anemia  . CAD (coronary artery  disease)   . CHF (congestive heart failure) ([facility])     Systolic   . History of colon cancer   . Neuropathy ([place])   . Allergy   . GERD (gastroesophageal reflux disease)   . Hiatal hernia   . Diverticulosis   . Asthma   . Arthritis   . H/O: GI bleed   . Breast cancer ([place]) 2011    s/p Bilateral masectomy  . Shortness of breath   . Pneumonia 03/2012  . Carotid artery aneurysm ([place]) 08/2010    right ICA, 5 x 67m  . DDD (degenerative disc disease), cervical   . Colon cancer ([place]   . Colon cancer ([place] 12/11/2013  . Myocardial infarction ([place]   . Hypercalcemia 06/09/2014    Past Surgical History: Past Surgical History  Procedure Laterality Date  . Breast masectomy  10/2010    Bilateral masectomy by Dr NLucia Gaskinss/p invasive ductal carcinoma.  . Cardiac  catherization  2007    Severe 3-vessel disease.  EF 20-25%.  . Esophagogastroduodenoscopy  2001    Esophageal Tear  . Total knee arthroplasty  2001  . Esophagogastroduodenoscopy  10/27/2012    Procedure: ESOPHAGOGASTRODUODENOSCOPY (  EGD);  Surgeon: Irene Shipper, MD;  Location: [facility];  Service: Endoscopy;  Laterality: N/A;  pat  . Breast surgery    . Joint replacement Right 2001    Knee    Social History: Social History  Substance Use Topics  . Smoking status: Never Smoker   . Smokeless tobacco: Current User    Types: Snuff  . Alcohol Use: No   Additional social history: none  Please also refer to relevant sections of EMR.  Family History: Family History  Problem Relation Age of Onset  . Sickle cell anemia Other   . Heart disease Mother   . Heart disease Father   . Cancer Daughter 28    breast ca  . Hypertension Daughter   . Cancer Daughter 96    breast ca  . Hypertension Daughter   . Heart disease Son     Poor circulation-Left Leg  . Diabetes Daughter   . Hypertension Daughter   . Hyperlipidemia Daughter     Poor circulation- Toe amputation    Allergies and Medications: Allergies  Allergen  Reactions  . Peanuts [Nuts] Swelling  . Lisinopril Cough   No current facility-administered medications on file prior to encounter.   Current Outpatient Prescriptions on File Prior to Encounter  Medication Sig Dispense Refill  . acetaminophen (TYLENOL) 500 MG tablet Take 500 mg by mouth every 6 (six) hours as needed for moderate pain (pain).    Marland Kitchen albuterol (PROVENTIL HFA;VENTOLIN HFA) 108 (90 BASE) MCG/ACT inhaler Inhale 2 puffs into the lungs every 6 (six) hours as needed. For shortness of breath 1 Inhaler 6  . aspirin (ASPIRIN CHILDRENS) 81 MG chewable tablet Chew 81 mg by mouth daily.      . Blood Pressure KIT Use the blood pressure kit to check you blood pressure twice a day 1 each 0  . carvedilol (COREG) 12.5 MG tablet TAKE 1 TABLET BY MOUTH TWICE A DAY WITH FOOD 60 tablet 1  . clopidogrel (PLAVIX) 75 MG tablet TAKE 1 TABLET (75 MG TOTAL) BY MOUTH DAILY. 30 tablet 2  . colchicine 0.6 MG tablet Take 2 tabs by mouth for gout, then 1 tab in one hour. Do not take more for 3 days. 9 tablet 0  . diclofenac sodium (VOLTAREN) 1 % GEL APPLY 2 grams topically 4 TIMES DAILY AS NEEDED FOR PAIN 100 g 1  . gabapentin (NEURONTIN) 300 MG capsule TAKE ONE CAPSULE BY MOUTH TWICE A DAY 60 capsule 1  . glucose blood test strip Use as instructed Dx. Code 250.02 100 each 12  . loratadine (CLARITIN) 10 MG tablet TAKE 1/2 TABLET BY MOUTH EVERY DAY 15 tablet 0  . losartan (COZAAR) 50 MG tablet TAKE 1 TABLET (50 MG TOTAL) BY MOUTH AT BEDTIME. 30 tablet 0  . magnesium hydroxide (MILK OF MAGNESIA) 400 MG/5ML suspension Take 30 mLs by mouth daily as needed for mild constipation.    Marland Kitchen PATADAY 0.2 % SOLN APPLY 1 DROP TO EYE DAILY. 2.5 mL 0  . ranitidine (ZANTAC) 150 MG capsule TAKE 1 CAPSULE BY MOUTH 2 TIMES DAILY AS NEEDED FOR heartburn 60 capsule 2  . rosuvastatin (CRESTOR) 20 MG tablet Take 1 tablet (20 mg total) by mouth at bedtime. 30 tablet 2  . tamoxifen (NOLVADEX) 20 MG tablet TAKE 1 TABLET (20 MG TOTAL) BY  MOUTH DAILY. 30 tablet 3  . traMADol (ULTRAM) 50 MG tablet Take 1 tablet (50 mg total) by mouth every 8 (eight) hours as needed for  severe pain. 75 tablet 0  . VOLTAREN 1 % GEL APPLY 2 GRAMS TOPICALLY 4 (FOUR) TIMES DAILY AS NEEDED FOR PAIN 100 g 0  . [DISCONTINUED] cetirizine (ZYRTEC) 5 MG tablet Take 1 tablet (5 mg total) by mouth daily. Take at night time as it can cause some drowsiness 30 tablet 4    Objective: BP 193/55 mmHg  Pulse 61  Temp(Src) 98.3 F (36.8 C) (Oral)  Resp 16  Ht _0  (1.499 m)  Wt 114 lb (51.71 kg)  BMI 23.01 kg/m2  SpO2 100% Exam: General: sleeping in bed.  Easily rouses to voice/ stimulation, NAD Eyes: EOMI, PERRL, +arcus senilis, sclera white ENTM: o/p clear, dentition poor, MMM Neck: supple Cardiovascular: RRR, no mumurs Respiratory: CTAB, normal work of breathing on RA Abdomen: flat, soft, NT/ND, +BS MSK: WWP, thin, no edema Skin: dry, in tact, no rashes appreciated Neuro: Sleepy but follows commands, AOx3, CN 2-12 grossly intact with the exception of decreased hearing on R. LE cerebellar testing normal, 4/5 UE and LE strength.  Light touch sensation grossly intact. Psych: mood stable, speech normal  Labs and Imaging: CBC BMET   Recent Labs Lab 11/24/15 2300  WBC 4.5  HGB 9.8*  HCT 31.7*  PLT 161    Recent Labs Lab 11/24/15 2300  NA 141  K 4.0  CL 110  CO2 25  BUN 16  CREATININE 0.96  GLUCOSE 201*  CALCIUM 10.6*     Dg Chest 2 View  11/25/2015  CLINICAL DATA:  82 year old female with cough and generalized weakness today. EXAM: CHEST  2 VIEW COMPARISON:  Chest radiograph 09/16/2015, multiple prior exams reviewed FINDINGS: Cardiomediastinal contours are unchanged with borderline cardiomegaly and tortuous thoracic aorta. No pulmonary edema, airspace consolidation, pleural effusion or pneumothorax. No acute osseous abnormality. IMPRESSION: No acute pulmonary process. Electronically Signed   By: Jeb Levering M.D.   On: 11/25/2015  00:26   Ct Head Wo Contrast  11/25/2015  CLINICAL DATA:  Generalized weakness for days. Right lower extremity weakness and blurry vision. EXAM: CT HEAD WITHOUT CONTRAST TECHNIQUE: Contiguous axial images were obtained from the base of the skull through the vertex without intravenous contrast. COMPARISON:  CT 09/16/2015 FINDINGS: Atrophy and chronic small vessel ischemia, unchanged from prior exam. Remote lacunar infarct in the right basal ganglia, unchanged.No intracranial hemorrhage, mass effect, or midline shift. No hydrocephalus. The basilar cisterns are patent. No evidence of territorial infarct. No intracranial fluid collection. Atherosclerosis of skullbase vasculature. Calvarium is intact. Included paranasal sinuses and mastoid air cells are well aerated. Previous paranasal signs inflammatory change has resolved. IMPRESSION: Atrophy and chronic small vessel ischemia without acute intracranial abnormality. Electronically Signed   By: Jeb Levering M.D.   On: 11/25/2015 00:45    Janora Norlander, DO 11/25/2015, 1:42 AM PGY-2, [place] Intern pager: [phone], text pages welcome

## 2015-11-26 ENCOUNTER — Observation Stay (HOSPITAL_COMMUNITY): Payer: Medicare Other

## 2015-11-26 DIAGNOSIS — R531 Weakness: Secondary | ICD-10-CM | POA: Diagnosis not present

## 2015-11-26 LAB — URINE CULTURE

## 2015-11-26 LAB — GLUCOSE, CAPILLARY
GLUCOSE-CAPILLARY: 158 mg/dL — AB (ref 65–99)
Glucose-Capillary: 131 mg/dL — ABNORMAL HIGH (ref 65–99)

## 2015-11-26 MED ORDER — NICOTINE 7 MG/24HR TD PT24: 7.0000 mg | MEDICATED_PATCH | Freq: Every day | TRANSDERMAL | Status: DC | PRN

## 2015-11-26 MED ORDER — CEPHALEXIN 500 MG PO CAPS: 500.0000 mg | ORAL_CAPSULE | Freq: Three times a day (TID) | ORAL | Status: DC

## 2015-11-26 NOTE — Progress Notes (Signed)
Family Medicine Teaching Service Daily Progress Note Intern Pager: [phone]  [name] Medical record number: [id number] Date of birth: May 12, 1924 Age: 82 y.o. Gender: female  Primary Care Provider: Kathrine Cords, MD Consultants: None Code Status: Full  Pt Overview and Major Events to Date:  1/12: Admitted to FMTS for RLE weakness and hypertensive urgency  Assessment and Plan: [name] is a 82 y.o. female presenting with increased RLE weakness . PMH is significant for lacunar infarct, HFpEF, DM2, HTN, tobacco use d/o, CAD, asthma, anemia of chronic disease, breast cancer s/p mastectomy 2011.  Hypertensive Urgency, improving: She did not receive any of her home BP medications until yesterday evening. BPs up to 216/62 last night, given Labetalol 10mg  x 2 overnight. BPs improved to 131/73 this morning.  - Continue home meds: Carvedilol 12.5mg  bid, Losartan 50mg  daily - Can give another dose of Labetalol as needed for elevated BPs. - Vital per unit protocol  Right lower extremity weakness: Resolved. No concern for stroke. - Continue ASA, statin, gabapentin, and tramadol prn severe pain  - ABIs ordered. If she doesn't get these today, can get them as an outpatient. - PT recommended HHPT, [place], [place] aide, wheelchair - OT consult  UTI: Endorsing urinary frequency and urgency. - s/p Rocephin in ED - Transitioned to Keflex 500mg  tid on 1/12. - Urine culture pending  DM2: Diet controlled outpatient. A1c 06/2015 was 6.5%. - SSI sensitive, CBG monitoring  CAD/ HFpEF: Stable - Monitor  Tobacco use d/o: uses chewing tobacco daily - Nicotine patch prn - Tobacco cessation counseling  Anemia of chronic disease: hgb 9.8 (stable, b/l 8-10) - Monitor  Breast cancer w/ bilateral mastectomy 2011. - Continue Tamoxifen  FEN/GI: Heart-healthy diet Prophylaxis: hep sub-q  Disposition: Plan to discharge today.  Subjective:  Feeling better today. Right lower extremity  weakness has improved. Would like to go home today.  Objective: Temp:  [98.1 F (36.7 C)-98.5 F (36.9 C)] 98.1 F (36.7 C) (01/13 0435) Pulse Rate:  [61-69] 66 (01/13 0435) Resp:  [14-20] 20 (01/13 0435) BP: (135-216)/(47-68) 174/51 mmHg (01/13 0435) SpO2:  [96 %-100 %] 100 % (01/13 0435) Weight:  [114 lb 10.2 oz (52 kg)] 114 lb 10.2 oz (52 kg) (01/12 2100) Physical Exam: General: Well-appearing, alert, in NAD, answers questions appropriately HEENT: [place]/AT, EOMI, MMM Neck: supple Cardiovascular: RRR, no mumurs Respiratory: CTAB, normal work of breathing on RA Abdomen: flat, soft, NT/ND, +BS MSK: WWP, thin, no edema Skin: No rashes or lesions Neuro: Able to follow commands, AOx3, CN 2-12 grossly intact. 5/5 UE and LE strength bilaterally. Light touch sensation grossly intact. Psych: mood stable, speech normal  Laboratory:  Recent Labs Lab 11/24/15 2300 11/25/15 1012  WBC 4.5 4.1  HGB 9.8* 9.3*  HCT 31.7* 29.1*  PLT 161 148*    Recent Labs Lab 11/24/15 2300 11/25/15 0730 11/25/15 1012  NA 141 141  --   K 4.0 4.0  --   CL 110 111  --   CO2 25 22  --   BUN 16 16  --   CREATININE 0.96 0.98 0.96  CALCIUM 10.6* 10.1  --   PROT  --  6.4*  --   BILITOT  --  0.2*  --   ALKPHOS  --  44  --   ALT  --  8*  --   AST  --  14*  --   GLUCOSE 201* 128*  --    Imaging/Diagnostic Tests: MRI brain: negative for  stroke  Sela Hua, MD 11/26/2015, 7:18 AM PGY-1, [place] Intern pager: [phone], text pages welcome

## 2015-11-26 NOTE — Discharge Instructions (Signed)
You were hospitalized because you were having some weakness of your right leg. We evaluated you for stroke.  Imaging was NEGATIVE for acute stroke.  We had you work with physical therapy, who thought your walking was pretty good. Physical therapy recommended home health physical therapy, home health occupational therapy, and a wheelchair. We have ordered all of these things for you.  You will continue Cephalexin for the next 5 days for your urinary tract infection.  Please plan to follow up with Dr Lorenso Courier as scheduled.  STOP using tobacco. Smokeless Tobacco Use Smokeless tobacco is a loose, fine, or stringy tobacco. The tobacco is not smoked like a cigarette, but it is chewed or held in the lips or cheeks. It resembles tea and comes from the leaves of the tobacco plant. Smokeless tobacco is usually flavored, sweetened, or processed in some way. Although smokeless tobacco is not smoked into the lungs, its chemicals are absorbed through the membranes in the mouth and into the bloodstream. Its chemicals are also swallowed in saliva. The chemicals (nicotine and other toxins) are known to cause cancer. Smokeless tobacco contains up to 28 differentcarcinogens. CAUSES Nicotine is addictive. Smokeless tobacco contains nicotine, which is a stimulant. This stimulant can give you a "buzz" or altered state. People can become addicted to the feeling it delivers.  SYMPTOMS Smokeless tobacco can cause health problems, including:  Bad breath.  Yellow-brown teeth.  Mouth sores.  Cracking and bleeding lips.  Gum disease, gum recession, and bone loss around the teeth.  Tooth decay.  Increased or irregular heart rate.  High blood pressure, heart disease, and stroke.  Cancer of the mouth, lips, tongue, pancreas, voice box (larynx), esophagus, colon, and bladder.  Precancerous lesion of the soft tissues of the mouth (leukoplakia).  Loss of your sense of taste. TREATMENT Talk with your caregiver about  ways you can quit. Quitting tobacco is a good decision for your health. Nicotine is addictive, but several options are available to help you quit including:  Nicotine replacement therapy (gum or patch).  Support and cessation programs. The following tips can help you quit:  Write down the reasons you would like to quit and look at them often.  Set a date during a low stress time to stop or cut back.  Ask family and friends for their support.  Remove all tobacco products from your home and work.  Replace the chewing tobacco with things like beef jerky, sunflower seeds, or shredded coconut.  Avoid situations that may make you want to chew tobacco.  Exercise and eat a healthy diet.  When you crave tobacco, distract yourself with drinking water, sugarless chewing gum, sugarless hard candy, exercising, or deep breathing. HOME CARE INSTRUCTIONS  See your dentist for regular oral health exams every 6 months.  Follow up with your caregiver as recommended. SEEK MEDICAL CARE OR DENTAL CARE IF:  You have bleeding or cracking lips, gums, or cheeks.  You have mouth sores, discolorations, or pain.  You have tooth pain.  You develop persistent irritation, burning, or sores in the mouth.  You have pain, tenderness, or numbness in the mouth.  You develop a lump, bumpy patch, or hardened skin inside the mouth.  The color changes inside your mouth (gray, white, or red spots).  You have difficulty chewing, swallowing, or speaking.   This information is not intended to replace advice given to you by your health care provider. Make sure you discuss any questions you have with your health care provider.  Document Released: 04/03/2011 Document Revised: 01/22/2012 Document Reviewed: 04/03/2011 Elsevier Interactive Patient Education [employer].

## 2015-11-26 NOTE — Progress Notes (Signed)
Occupational Therapy Discharge Patient Details Name: [name] MRN: [id number] DOB: [birth year] Today's Date: 11/26/2015 Time:  -     Patient discharged from OT services secondary to dc home with home health today. OT to defer all further assessment to [place].Marland Kitchen  Please see latest therapy progress note for current level of functioning and progress toward goals.    Progress and discharge plan discussed with patient and/or caregiver: Patient/Caregiver agrees with plan  GO     Vonita Moss   OTR/L Pager: [phone] Office: [phone] .  11/26/2015, 11:29 AM

## 2015-11-28 NOTE — Discharge Summary (Signed)
[name] Discharge Summary  [name] Medical record number: [id number] Date of birth: [birth year] Age: 82 y.o. Gender: female Date of Admission: 11/24/2015  Date of Discharge: 11/26/15 Admitting Physician: Leeanne Rio, MD  Primary Care Provider: Kathrine Cords, MD Consultants: Neurology  Indication for Hospitalization: Right lower extremity weakness  Discharge Diagnoses/Problem List:  UTI T2DM Asthma HTN CAD HFpEF Tobacco abuse  Disposition: Home with HHPT and [place]  Discharge Condition: Stable, improved  Discharge Exam:  General: Well-appearing, alert, in NAD, answers questions appropriately HEENT: [place]/AT, EOMI, MMM Neck: supple Cardiovascular: RRR, no mumurs Respiratory: CTAB, normal work of breathing on RA Abdomen: flat, soft, NT/ND, +BS MSK: WWP, thin, no edema Skin: No rashes or lesions Neuro: Able to follow commands, AOx3, CN 2-12 grossly intact. 5/5 UE and LE strength bilaterally. Light touch sensation grossly intact. Psych: mood stable, speech normal  Brief [name] Course:  [name] is an 82 year old female who presented to the ED with right lower extremity weakness for 1 month that has worsened in the last week. She also endorsed urinary frequency and urgency. In the ED, UA was significant for many bacteria, large leukocytes, positive nitrites, and TNTC WBC. She was given Rocephin x 1. There was some concern for a stroke, so CT head was performed which did not show any acute intracranial abnormality. Neurology was consulted in the ED and recommended MRI brain, which showed multiple remote lacunar infarcts, but no evidence of acute infarct. Neuro did not recommend any additional work-up. When we examined her, we did not appreciate any right lower extremity weakness and she noted that the weakness completely resolved on the day of discharge. She was evaluated by PT/OT who recommended HHPT and HHOT. She was  discharged home with these services.   For her UTI, she was treated with Rocephin x 1 in the ED. Per chart review, she has a history of UTIs with pan-sensitive E. Coli. We transitioned her to Keflex PO 556m tid for a total 7 day course. After the patient was discharged, her urine culture grew multiple species.  During her hospitalization, [name] found to have a BP of 216/62. We realized that she had not received any of her BP medications that day because she had been sitting in the ED awaiting bed placement. We restarted her home BP medications and she received 2 doses of IV Labetalol 158m Her BP subsequently improved to 131/73 on the day of discharge.   Issues for Follow Up:  1. Pt endorsed urinary frequency and urinary urgency. UA was consistent with infection. She was given Rocephin x 1 and we transitioned her to Keflex for 7 days. After she was discharged, her urine culture resulted and showed multiple species. If she is still having urinary frequency and urgency, we recommend repeat UA and urine culture. 2. Pt was noted to have some increased BPs during her hospitalization. We think this was because she did not receive her home BP meds while in the ED. We did not make any changes to her home regimen. Please follow-up with her blood pressures and adjust her medications as needed. 3. Pt was noted to have decreased pulses in her right lower extremity. She received ABIs on 10/13/15. Please follow this as an outpatient. 4. We ordered HHPT and HHOT for [name] PT/OT recommendations. Please make sure she is receiving these services.  Significant Procedures: None  Significant Labs and Imaging:   Recent Labs Lab 11/24/15  2300 11/25/15 1012  WBC 4.5 4.1  HGB 9.8* 9.3*  HCT 31.7* 29.1*  PLT 161 148*    Recent Labs Lab 11/24/15 2300 11/25/15 0730 11/25/15 1012  NA 141 141  --   K 4.0 4.0  --   CL 110 111  --   CO2 25 22  --   GLUCOSE 201* 128*  --   BUN 16 16  --    CREATININE 0.96 0.98 0.96  CALCIUM 10.6* 10.1  --   ALKPHOS  --  44  --   AST  --  14*  --   ALT  --  8*  --   ALBUMIN  --  2.8*  --    I-stat trop: 0.01 Troponin: <0.03 UA: many bacteria, 100 glucose, trace Hgb, large leukocytes, positive nitrites, 100 protein, 6-30 squams, TNTC WBC Urine culture: multiple species, suggest recollection  CT head (1/11): Atrophy and chronic small vessel ischemia without acute intracranial abnormality MRI brain (1/12): No acute intracranial infarct. Age-related cerebral atrophy with mild chronic small vessel ischemic disease and multiple remote lacunar infarcts.  Results/Tests Pending at Time of Discharge: None  Discharge Medications:    Medication List    STOP taking these medications        colchicine 0.6 MG tablet      TAKE these medications        acetaminophen 500 MG tablet  Commonly known as:  TYLENOL  Take 500 mg by mouth every 6 (six) hours as needed for moderate pain (pain).     albuterol 108 (90 Base) MCG/ACT inhaler  Commonly known as:  PROVENTIL HFA;VENTOLIN HFA  Inhale 2 puffs into the lungs every 6 (six) hours as needed. For shortness of breath     ASPIRIN CHILDRENS 81 MG chewable tablet  Generic drug:  aspirin  Chew 81 mg by mouth every other day.     Blood Pressure Kit  Use the blood pressure kit to check you blood pressure twice a day     carvedilol 12.5 MG tablet  Commonly known as:  COREG  TAKE 1 TABLET BY MOUTH TWICE A DAY WITH FOOD     cephALEXin 500 MG capsule  Commonly known as:  KEFLEX  Take 1 capsule (500 mg total) by mouth every 8 (eight) hours.     clopidogrel 75 MG tablet  Commonly known as:  PLAVIX  TAKE 1 TABLET (75 MG TOTAL) BY MOUTH DAILY.     gabapentin 300 MG capsule  Commonly known as:  NEURONTIN  TAKE ONE CAPSULE BY MOUTH TWICE A DAY     glucose blood test strip  Use as instructed Dx. Code 250.02     loratadine 10 MG tablet  Commonly known as:  CLARITIN  TAKE 1/2 TABLET BY MOUTH EVERY  DAY     losartan 50 MG tablet  Commonly known as:  COZAAR  TAKE 1 TABLET (50 MG TOTAL) BY MOUTH AT BEDTIME.     MILK OF MAGNESIA 400 MG/5ML suspension  Generic drug:  magnesium hydroxide  Take 30 mLs by mouth daily as needed for mild constipation.     nicotine 7 mg/24hr patch  Commonly known as:  NICODERM CQ - dosed in mg/24 hr  Place 1 patch (7 mg total) onto the skin daily as needed (nicotine withdrawal).     PATADAY 0.2 % Soln  Generic drug:  Olopatadine HCl  APPLY 1 DROP TO EYE DAILY.     ranitidine 150 MG capsule  Commonly known  as:  ZANTAC  TAKE 1 CAPSULE BY MOUTH 2 TIMES DAILY AS NEEDED FOR heartburn     rosuvastatin 20 MG tablet  Commonly known as:  CRESTOR  Take 1 tablet (20 mg total) by mouth at bedtime.     tamoxifen 20 MG tablet  Commonly known as:  NOLVADEX  TAKE 1 TABLET (20 MG TOTAL) BY MOUTH DAILY.     traMADol 50 MG tablet  Commonly known as:  ULTRAM  Take 1 tablet (50 mg total) by mouth every 8 (eight) hours as needed for severe pain.     VOLTAREN 1 % Gel  Generic drug:  diclofenac sodium  APPLY 2 GRAMS TOPICALLY 4 (FOUR) TIMES DAILY AS NEEDED FOR PAIN     diclofenac sodium 1 % Gel  Commonly known as:  VOLTAREN  APPLY 2 grams topically 4 TIMES DAILY AS NEEDED FOR PAIN        Discharge Instructions: Please refer to Patient Instructions section of EMR for full details.  Patient was counseled important signs and symptoms that should prompt return to medical care, changes in medications, dietary instructions, activity restrictions, and follow up appointments.   Follow-Up Appointments:     Follow-up Information    Follow up with Kathrine Cords, MD. Go on 12/08/2015.   Specialty:  Family Medicine   Why:  [name] follow up   Contact information:   [address] [place] [address] [place], MD 11/28/2015, 10:19 AM PGY-1, [place]

## 2015-12-02 DIAGNOSIS — E1151 Type 2 diabetes mellitus with diabetic peripheral angiopathy without gangrene: Secondary | ICD-10-CM | POA: Diagnosis not present

## 2015-12-02 DIAGNOSIS — I5042 Chronic combined systolic (congestive) and diastolic (congestive) heart failure: Secondary | ICD-10-CM | POA: Diagnosis not present

## 2015-12-02 DIAGNOSIS — N390 Urinary tract infection, site not specified: Secondary | ICD-10-CM | POA: Diagnosis not present

## 2015-12-02 DIAGNOSIS — I110 Hypertensive heart disease with heart failure: Secondary | ICD-10-CM | POA: Diagnosis not present

## 2015-12-02 DIAGNOSIS — M6281 Muscle weakness (generalized): Secondary | ICD-10-CM | POA: Diagnosis not present

## 2015-12-02 DIAGNOSIS — E1140 Type 2 diabetes mellitus with diabetic neuropathy, unspecified: Secondary | ICD-10-CM | POA: Diagnosis not present

## 2015-12-06 ENCOUNTER — Telehealth: Payer: Self-pay | Admitting: *Deleted

## 2015-12-06 ENCOUNTER — Inpatient Hospital Stay: Admission: AD | Admit: 2015-12-06 | Payer: Medicare Other | Source: Ambulatory Visit | Admitting: Family Medicine

## 2015-12-06 ENCOUNTER — Telehealth: Payer: Self-pay | Admitting: Family Medicine

## 2015-12-06 DIAGNOSIS — E1151 Type 2 diabetes mellitus with diabetic peripheral angiopathy without gangrene: Secondary | ICD-10-CM | POA: Diagnosis not present

## 2015-12-06 DIAGNOSIS — I110 Hypertensive heart disease with heart failure: Secondary | ICD-10-CM | POA: Diagnosis not present

## 2015-12-06 DIAGNOSIS — I5042 Chronic combined systolic (congestive) and diastolic (congestive) heart failure: Secondary | ICD-10-CM | POA: Diagnosis not present

## 2015-12-06 DIAGNOSIS — N390 Urinary tract infection, site not specified: Secondary | ICD-10-CM | POA: Diagnosis not present

## 2015-12-06 DIAGNOSIS — E1140 Type 2 diabetes mellitus with diabetic neuropathy, unspecified: Secondary | ICD-10-CM | POA: Diagnosis not present

## 2015-12-06 DIAGNOSIS — M6281 Muscle weakness (generalized): Secondary | ICD-10-CM | POA: Diagnosis not present

## 2015-12-06 NOTE — Telephone Encounter (Signed)
Needs verbal orders for PT 2 times per week for 5 weeks

## 2015-12-06 NOTE — Telephone Encounter (Signed)
LeSearr, RN with [facility] called stating she completed the home evaluation for medication and disease management.  They will coutinue to follow patient twice a week for 4 weeks and once a week for 5 weeks.  Please call with questions or concerns at [phone].  Derl Barrow, RN

## 2015-12-07 ENCOUNTER — Other Ambulatory Visit: Payer: Self-pay | Admitting: Family Medicine

## 2015-12-07 NOTE — Telephone Encounter (Signed)
Attempted to call Danielle for verbal orders for PT twice week for 5 weeks. Left voicemail with our clinic number.  [name], MD [facility] Resident  12/07/2015, 1:14 PM

## 2015-12-08 ENCOUNTER — Ambulatory Visit (INDEPENDENT_AMBULATORY_CARE_PROVIDER_SITE_OTHER): Payer: Medicare Other | Admitting: Family Medicine

## 2015-12-08 ENCOUNTER — Encounter: Payer: Self-pay | Admitting: Family Medicine

## 2015-12-08 VITALS — BP 219/61 | HR 75 | Temp 98.4°F | Resp 18 | Ht 59.0 in | Wt 116.0 lb

## 2015-12-08 DIAGNOSIS — R112 Nausea with vomiting, unspecified: Secondary | ICD-10-CM | POA: Diagnosis not present

## 2015-12-08 DIAGNOSIS — I10 Essential (primary) hypertension: Secondary | ICD-10-CM | POA: Diagnosis not present

## 2015-12-08 DIAGNOSIS — M546 Pain in thoracic spine: Secondary | ICD-10-CM | POA: Diagnosis not present

## 2015-12-08 DIAGNOSIS — R29898 Other symptoms and signs involving the musculoskeletal system: Secondary | ICD-10-CM

## 2015-12-08 DIAGNOSIS — E119 Type 2 diabetes mellitus without complications: Secondary | ICD-10-CM

## 2015-12-08 LAB — POCT GLYCOSYLATED HEMOGLOBIN (HGB A1C): HEMOGLOBIN A1C: 8.4

## 2015-12-08 MED ORDER — TRAMADOL HCL 50 MG PO TABS: 50.0000 mg | ORAL_TABLET | Freq: Three times a day (TID) | ORAL | Status: DC | PRN

## 2015-12-08 MED ORDER — AMLODIPINE BESYLATE 5 MG PO TABS: 5.0000 mg | ORAL_TABLET | Freq: Every day | ORAL | Status: DC

## 2015-12-08 NOTE — Assessment & Plan Note (Signed)
Patient's BP not controlled today, has isolated systolic hypertension. Patient asymptomatic, I suspect she's been running this high since her discharge. Rx for amlodipine. Discussed to get this filled today and start taking it today. Patient's medications have been increased, then decreased several times due to fluctuating blood pressures. - called LeSearr, RN with [facility] to see if we can have telehealth set up (this was done in the past) where our office will receive a copy of her BPs at home. - re-started amlodipine 5mg  daily.  - continue losartan and coreg, stress importance of making sure she's been taking them all. - follow up with me in 1 month or sooner as needed, if we cannot get telehealth set up, will have her come in for nursing BP checks.

## 2015-12-08 NOTE — Patient Instructions (Signed)
I've left a message for the home health nurse to see if we can get telehealth set up to send your blood pressures to my office. Ask Danielle to take a look at your walker to see what she thinks of it. Start taking amlodipine. This is in addition to your other blood pressure medications: losartan and Coreg twice daily. Follow up with me in 1 month or sooner as needed.

## 2015-12-08 NOTE — Progress Notes (Signed)
Patient is here for FU  Patient denies pain at this time.  BP 1ST: 222/76 2HD: 232/76  3RD: 216/67

## 2015-12-08 NOTE — Progress Notes (Signed)
Patient ID: [name], female   DOB: 11-Jan-1924, 82 y.o.   MRN: [id number]     Subjective: CC: hospital follow up  HPI: Patient is a 82 y.o. female with a past medical history of HTN, DM, breast cancer, OA, among other things presenting to clinic today for a hospital follow up. She's accompanied by her PCA today (who comes daily for 2-3 hours), her daughter is currently hospitalized.   She was found to have a UTI during her hospitalization. She's finished the Keflex without issue. Her urine isn't as malodorous as it once was. Not having any other symptoms.   Patient has noted her knees are "weak on her." Notices this most promently with walking far distances. She uses a rolling walker but notes she needs a new one because the one she has is broken.  No falls. No popping, clicking sensation in her knees. Notes the L knee is the good one, she's had surgery on the R one. No pain in the knees, only weakness.  PT went out to see her for the first time this week since her hospitalization.    Hypertension: Currently taking losartan and coreg. Was taken off the amlodipine in the past due to low BPs in the hospital. Patient cannot tell me what medications she's taking, her daughter Sigurd Sos puts all her medications in pill box labeled AM and PM. Her PCA does not know what medications she's taking either.  She's not having blurred vision, headache, chest pain, or SOB currently.   Social History: does snuff, does not smoke cigarettes  ROS: All other systems reviewed and are negative.  Past Medical History Patient Active Problem List   Diagnosis Date Noted  . Leg weakness, bilateral 12/08/2015  . UTI (lower urinary tract infection) 11/25/2015  . Weakness of right lower extremity 11/25/2015  . Dry eye 10/28/2015  . Pyelonephritis 09/16/2015  . (HFpEF) heart failure with preserved ejection fraction ([place])   . CAD in native artery   . Emesis, persistent   . Chest congestion 09/08/2015  . Gout  05/14/2015  . UTI (urinary tract infection) 04/15/2015  . Acute delirium 04/15/2015  . Fever 04/07/2015  . Hypertension 04/07/2015  . Dyslipidemia   . Essential hypertension   . Seasonal allergies 02/10/2015  . Nausea with vomiting 02/10/2015  . Flank pain 02/10/2015  . Stroke-like symptoms   . Stroke-like episode ([place]) 01/25/2015  . Syncope and collapse 01/25/2015  . Accelerated hypertension   . Hypertensive emergency 09/19/2014  . Anemia of chronic illness 08/19/2014  . Acute superficial venous thrombosis of right lower extremity 08/19/2014  . Hypercalcemia 06/09/2014  . Breast cancer, left breast ([place]) 06/09/2014  . Breast cancer, right breast ([place]) 06/09/2014  . Edema of left lower extremity 03/30/2014  . PAD (peripheral artery disease) ([place]) 03/30/2014  . Colon cancer ([place]) 12/11/2013  . Neck pain 10/22/2013  . History of fall 05/15/2013  . Memory deficit 05/15/2013  . Bilateral leg pain 03/07/2013  . Trigger thumb of right hand 03/07/2013  . Lower extremity edema 02/19/2013  . Stricture and stenosis of esophagus 10/27/2012  . Poor social situation 09/13/2012  . Low back pain 08/07/2012  . Musculoskeletal chest pain 07/10/2012  . Thoracic back pain 05/24/2012  . Weight loss 04/03/2012  . Insomnia 09/25/2011  . Tobacco abuse 06/02/2011  . History of breast cancer in female, bilateral.  Mastectomies 10/24/2010. 04/26/2011  . Hiatal hernia 03/22/2011  . Colon polyp 03/22/2011  . Poor circulation 03/22/2011  . Vertebrobasilar  artery syndrome 08/22/2010  . NEUROPATHY 08/25/2009  . ANEMIA-IRON DEFICIENCY 04/29/2007  . HLD (hyperlipidemia) 01/29/2007  . Diabetes type 2, controlled ([place]) 01/10/2007  . HYPERTENSION, BENIGN SYSTEMIC 01/10/2007  . Coronary atherosclerosis 01/10/2007  . Chronic systolic heart failure ([place]) 01/10/2007  . Asthma 01/10/2007  . Reflux esophagitis 01/10/2007  . Osteoarthritis, multiple sites 01/10/2007    Medications- reviewed and  updated Current Outpatient Prescriptions  Medication Sig Dispense Refill  . acetaminophen (TYLENOL) 500 MG tablet Take 500 mg by mouth every 6 (six) hours as needed for moderate pain (pain).    Marland Kitchen albuterol (PROVENTIL HFA;VENTOLIN HFA) 108 (90 BASE) MCG/ACT inhaler Inhale 2 puffs into the lungs every 6 (six) hours as needed. For shortness of breath 1 Inhaler 6  . amLODipine (NORVASC) 5 MG tablet Take 1 tablet (5 mg total) by mouth daily. 30 tablet 0  . aspirin (ASPIRIN CHILDRENS) 81 MG chewable tablet Chew 81 mg by mouth every other day.     . Blood Pressure KIT Use the blood pressure kit to check you blood pressure twice a day 1 each 0  . carvedilol (COREG) 12.5 MG tablet TAKE 1 TABLET BY MOUTH TWICE A DAY WITH FOOD 60 tablet 1  . cephALEXin (KEFLEX) 500 MG capsule Take 1 capsule (500 mg total) by mouth every 8 (eight) hours. 15 capsule 0  . clopidogrel (PLAVIX) 75 MG tablet TAKE 1 TABLET (75 MG TOTAL) BY MOUTH DAILY. 30 tablet 2  . diclofenac sodium (VOLTAREN) 1 % GEL APPLY 2 grams topically 4 TIMES DAILY AS NEEDED FOR PAIN 100 g 1  . gabapentin (NEURONTIN) 300 MG capsule TAKE ONE CAPSULE BY MOUTH TWICE A DAY 60 capsule 1  . glucose blood test strip Use as instructed Dx. Code 250.02 100 each 12  . loratadine (CLARITIN) 10 MG tablet TAKE 1/2 TABLET BY MOUTH DAILY 15 tablet 0  . losartan (COZAAR) 50 MG tablet TAKE 1 TABLET (50 MG TOTAL) BY MOUTH AT BEDTIME. (Patient taking differently: TAKE 1 TABLET (50 MG TOTAL) BY MOUTH IN THE MORNING) 30 tablet 0  . magnesium hydroxide (MILK OF MAGNESIA) 400 MG/5ML suspension Take 30 mLs by mouth daily as needed for mild constipation.    . nicotine (NICODERM CQ - DOSED IN MG/24 HR) 7 mg/24hr patch Place 1 patch (7 mg total) onto the skin daily as needed (nicotine withdrawal). 28 patch 0  . PATADAY 0.2 % SOLN APPLY 1 DROP TO EYE DAILY. (Patient taking differently: APPLY 1 DROP TO BOTH EYES DAILY.) 2.5 mL 0  . ranitidine (ZANTAC) 150 MG capsule TAKE 1 CAPSULE BY  MOUTH 2 TIMES DAILY AS NEEDED FOR heartburn 60 capsule 2  . rosuvastatin (CRESTOR) 20 MG tablet Take 1 tablet (20 mg total) by mouth at bedtime. 30 tablet 2  . tamoxifen (NOLVADEX) 20 MG tablet TAKE 1 TABLET (20 MG TOTAL) BY MOUTH DAILY. 30 tablet 3  . traMADol (ULTRAM) 50 MG tablet Take 1 tablet (50 mg total) by mouth every 8 (eight) hours as needed for severe pain. 75 tablet 0  . VOLTAREN 1 % GEL APPLY 2 GRAMS TOPICALLY 4 (FOUR) TIMES DAILY AS NEEDED FOR PAIN 100 g 0  . [DISCONTINUED] cetirizine (ZYRTEC) 5 MG tablet Take 1 tablet (5 mg total) by mouth daily. Take at night time as it can cause some drowsiness 30 tablet 4   No current facility-administered medications for this visit.    Objective: Office vital signs reviewed. BP 219/61 mmHg  Pulse 75  Temp(Src) 98.4 F (  36.9 C) (Oral)  Resp 18  Ht _0  (1.499 m)  Wt 116 lb (52.617 kg)  BMI 23.42 kg/m2  SpO2 97%   Physical Examination:  General: Awake, alert. Pleasant and in NAD. Rolling walker with tennis balls above the end of the stands.  Eyes: No conjunctival injection.  Cardio: RRR, II/VI systolic murmur. No rubs or gallops. No pitting edema. Pulm: No increased WOB. CTAB, without wheezes, rhonchi or crackles noted.  Abd:  HTD:SKAJGOT is able to stand up herself and ambulates with a rolling walker. No swelling, erythema, or pain over knees. No tenderness to palpation. 4/5 in knee flexion bilaterally, 5/5 in knee extension bilaterally.   Skin: dry, intact, no rashes or lesions  Assessment/Plan: Essential hypertension Patient's BP not controlled today, has isolated systolic hypertension. Patient asymptomatic, I suspect she's been running this high since her discharge. Rx for amlodipine. Discussed to get this filled today and start taking it today. Patient's medications have been increased, then decreased several times due to fluctuating blood pressures. - called LeSearr, RN with [facility] to see if we can have telehealth set up  (this was done in the past) where our office will receive a copy of her BPs at home. - re-started amlodipine 53m daily.  - continue losartan and coreg, stress importance of making sure she's been taking them all. - follow up with me in 1 month or sooner as needed, if we cannot get telehealth set up, will have her come in for nursing BP checks.   Leg weakness, bilateral Patient presenting with bilateral knee weakness, however declines falls. Did mention she urinated on herself once because she couldn't get to the bathroom in time (there was a box in her way to the bathroom.) We discussed making sure there are not objects or rugs that can come up and interfere with her ambulation and lead to a fall. Also discussed continuing to allow PT to come to her home twice weekly (patient has a history of refusing to let them in). Patient voiced understanding and notes she really likes Danielle with PT. It does look like the tennis balls on the back of her rolling walker are broken down completely on the bottom, which could be causing some trouble as well. - continue PT - called Danielle with PT to see if she has other balls that can be placed on the back of her RW.     Orders Placed This Encounter  Procedures  . HgB A1c     Meds ordered this encounter  Medications  . traMADol (ULTRAM) 50 MG tablet    Sig: Take 1 tablet (50 mg total) by mouth every 8 (eight) hours as needed for severe pain.    Dispense:  75 tablet    Refill:  0  . amLODipine (NORVASC) 5 MG tablet    Sig: Take 1 tablet (5 mg total) by mouth daily.    Dispense:  30 tablet    [place]  [id number]

## 2015-12-08 NOTE — Assessment & Plan Note (Signed)
Patient presenting with bilateral knee weakness, however declines falls. Did mention she urinated on herself once because she couldn't get to the bathroom in time (there was a box in her way to the bathroom.) We discussed making sure there are not objects or rugs that can come up and interfere with her ambulation and lead to a fall. Also discussed continuing to allow PT to come to her home twice weekly (patient has a history of refusing to let them in). Patient voiced understanding and notes she really likes Danielle with PT. It does look like the tennis balls on the back of her rolling walker are broken down completely on the bottom, which could be causing some trouble as well. - continue PT - called Danielle with PT to see if she has other balls that can be placed on the back of her RW.

## 2015-12-09 ENCOUNTER — Ambulatory Visit (HOSPITAL_BASED_OUTPATIENT_CLINIC_OR_DEPARTMENT_OTHER): Payer: Medicare Other | Admitting: Hematology and Oncology

## 2015-12-09 ENCOUNTER — Encounter: Payer: Self-pay | Admitting: Hematology and Oncology

## 2015-12-09 ENCOUNTER — Other Ambulatory Visit: Payer: Self-pay | Admitting: Family Medicine

## 2015-12-09 VITALS — BP 160/60 | HR 96 | Temp 98.3°F | Resp 19

## 2015-12-09 DIAGNOSIS — Z85038 Personal history of other malignant neoplasm of large intestine: Secondary | ICD-10-CM | POA: Diagnosis not present

## 2015-12-09 DIAGNOSIS — D638 Anemia in other chronic diseases classified elsewhere: Secondary | ICD-10-CM

## 2015-12-09 DIAGNOSIS — C50912 Malignant neoplasm of unspecified site of left female breast: Secondary | ICD-10-CM

## 2015-12-09 NOTE — Assessment & Plan Note (Addendum)
This is likely anemia of chronic disease and possibly thalassemia trait. The patient denies recent history of bleeding such as epistaxis, hematuria or hematochezia. She is asymptomatic from the anemia. We will observe for now.  She does not require transfusion now.

## 2015-12-09 NOTE — Telephone Encounter (Signed)
LeSearr, RN called back for Dr. Lorenso Courier.  She stated that the company they use for teleahealth comes with the bundle of blood pressure, weight and oxygen sat's. Please give her call with perimeters for BP, wt and O2.  Call [phone].  Derl Barrow, RN

## 2015-12-09 NOTE — Assessment & Plan Note (Signed)
She has no evidence of recurrence. Continue history and examination only. 

## 2015-12-09 NOTE — Telephone Encounter (Signed)
Lavell Anchors, RN with [facility] left voice message on nurse line. She was returning a call from Dr. Lorenso Courier needing clarification on orders.  Dr. Lorenso Courier left her a message on 12/08/15.  Please give her a call at [phone].  Derl Barrow, RN

## 2015-12-09 NOTE — Telephone Encounter (Signed)
Called LeSearr back. She will talk with telehealth about setting up BP monitoring so she checks her BPs daily and they're sent into our office intermittently. She will also ensure that she's filling her pill box appropriately.  Will fax an updated med list to [phone].   [name], MD [facility] Family Medicine Resident  12/09/2015, 12:42 PM

## 2015-12-09 NOTE — Assessment & Plan Note (Signed)
Clinically, she has no recurrence. She will discontinue tamoxifen after her current prescription is finished to complete 5 years with of adjuvant treatment.  So far, she tolerated treatment well without any side effects.  with her being a long-term cancer survivor with bilateral mastectomy and early-stage disease, I will discontinue follow-up from the clinic here

## 2015-12-09 NOTE — Progress Notes (Signed)
[facility] OFFICE PROGRESS NOTE  Patient Care Team: [name], MD as PCP - General Heath Lark, MD as Consulting Physician (Hematology and Oncology)  SUMMARY OF ONCOLOGIC HISTORY:  This is a pleasant lady who was found to have problems with breast cancer from screening mammogram. She has simple bilateral mastectomies and bilateral axillary lymph node dissection in 2011. On the right breast, showed a T1N0M0 left breast to have T1N1M0, both of them stained strongly for estrogen receptor positivity. She also have remote history of colon cancer. She was placed on tamoxifen from January 2012.  INTERVAL HISTORY: Please see below for problem oriented charting. She denies any recent abnormal breast examination, palpable mass, abnormal breast appearance or nipple changes  she feels well. Denies recent infection.The patient denies any recent signs or symptoms of bleeding such as spontaneous epistaxis, hematuria or hematochezia.   REVIEW OF SYSTEMS:   Constitutional: Denies fevers, chills or abnormal weight loss Eyes: Denies blurriness of vision Ears, nose, mouth, throat, and face: Denies mucositis or sore throat Respiratory: Denies cough, dyspnea or wheezes Cardiovascular: Denies palpitation, chest discomfort or lower extremity swelling Gastrointestinal:  Denies nausea, heartburn or change in bowel habits Skin: Denies abnormal skin rashes Lymphatics: Denies new lymphadenopathy or easy bruising Neurological:Denies numbness, tingling or new weaknesses Behavioral/Psych: Mood is stable, no new changes  All other systems were reviewed with the patient and are negative.  I have reviewed the past medical history, past surgical history, social history and family history with the patient and they are unchanged from previous note.  ALLERGIES:  is allergic to peanuts and lisinopril.  MEDICATIONS:  Current Outpatient Prescriptions  Medication Sig Dispense Refill  . acetaminophen  (TYLENOL) 500 MG tablet Take 500 mg by mouth every 6 (six) hours as needed for moderate pain (pain).    Marland Kitchen albuterol (PROVENTIL HFA;VENTOLIN HFA) 108 (90 BASE) MCG/ACT inhaler Inhale 2 puffs into the lungs every 6 (six) hours as needed. For shortness of breath 1 Inhaler 6  . amLODipine (NORVASC) 5 MG tablet Take 1 tablet (5 mg total) by mouth daily. 30 tablet 0  . aspirin (ASPIRIN CHILDRENS) 81 MG chewable tablet Chew 81 mg by mouth every other day.     . Blood Pressure KIT Use the blood pressure kit to check you blood pressure twice a day 1 each 0  . carvedilol (COREG) 12.5 MG tablet TAKE 1 TABLET BY MOUTH TWICE A DAY WITH FOOD 60 tablet 1  . clopidogrel (PLAVIX) 75 MG tablet TAKE 1 TABLET (75 MG TOTAL) BY MOUTH DAILY. 30 tablet 2  . diclofenac sodium (VOLTAREN) 1 % GEL APPLY 2 grams topically 4 TIMES DAILY AS NEEDED FOR PAIN 100 g 1  . gabapentin (NEURONTIN) 300 MG capsule TAKE ONE CAPSULE BY MOUTH TWICE A DAY 60 capsule 1  . glucose blood test strip Use as instructed Dx. Code 250.02 100 each 12  . loratadine (CLARITIN) 10 MG tablet TAKE 1/2 TABLET BY MOUTH DAILY 15 tablet 0  . losartan (COZAAR) 50 MG tablet TAKE 1 TABLET (50 MG TOTAL) BY MOUTH AT BEDTIME. (Patient taking differently: TAKE 1 TABLET (50 MG TOTAL) BY MOUTH IN THE MORNING) 30 tablet 0  . magnesium hydroxide (MILK OF MAGNESIA) 400 MG/5ML suspension Take 30 mLs by mouth daily as needed for mild constipation.    . nicotine (NICODERM CQ - DOSED IN MG/24 HR) 7 mg/24hr patch Place 1 patch (7 mg total) onto the skin daily as needed (nicotine withdrawal). 28 patch 0  .  PATADAY 0.2 % SOLN APPLY 1 DROP TO EYE DAILY. (Patient taking differently: APPLY 1 DROP TO BOTH EYES DAILY.) 2.5 mL 0  . ranitidine (ZANTAC) 150 MG capsule TAKE 1 CAPSULE BY MOUTH 2 TIMES DAILY AS NEEDED FOR heartburn 60 capsule 2  . rosuvastatin (CRESTOR) 20 MG tablet Take 1 tablet (20 mg total) by mouth at bedtime. 30 tablet 2  . tamoxifen (NOLVADEX) 20 MG tablet TAKE 1  TABLET (20 MG TOTAL) BY MOUTH DAILY. 30 tablet 3  . traMADol (ULTRAM) 50 MG tablet Take 1 tablet (50 mg total) by mouth every 8 (eight) hours as needed for severe pain. 75 tablet 0  . VOLTAREN 1 % GEL APPLY 2 GRAMS TOPICALLY 4 (FOUR) TIMES DAILY AS NEEDED FOR PAIN 100 g 0  . [DISCONTINUED] cetirizine (ZYRTEC) 5 MG tablet Take 1 tablet (5 mg total) by mouth daily. Take at night time as it can cause some drowsiness 30 tablet 4   No current facility-administered medications for this visit.    PHYSICAL EXAMINATION: ECOG PERFORMANCE STATUS: 2 - Symptomatic, <50% confined to bed  Filed Vitals:   12/09/15 1322  BP: 160/60  Pulse: 96  Temp: 98.3 F (36.8 C)  Resp: 19   There were no vitals filed for this visit.  GENERAL:alert, no distress and comfortable. She is examined sitting on the wheelchair SKIN: skin color, texture, turgor are normal, no rashes or significant lesions EYES: normal, Conjunctiva are pink and non-injected, sclera clear OROPHARYNX:no exudate, no erythema and lips, buccal mucosa, and tongue normal  NECK: supple, thyroid normal size, non-tender, without nodularity LYMPH:  no palpable lymphadenopathy in the cervical, axillary or inguinal LUNGS: clear to auscultation and percussion with normal breathing effort HEART: regular rate & rhythm and no murmurs and no lower extremity edema ABDOMEN:abdomen soft, non-tender and normal bowel sounds Musculoskeletal:no cyanosis of digits and no clubbing  NEURO: alert & oriented x 3 with fluent speech, no focal motor/sensory deficits  Chest wall examination revealed bilateral well-healed mastectomy scar LABORATORY DATA:  I have reviewed the data as listed    Component Value Date/Time   NA 141 11/25/2015 0730   NA 143 12/10/2014 1130   K 4.0 11/25/2015 0730   K 3.7 12/10/2014 1130   CL 111 11/25/2015 0730   CL 105 12/11/2012 1349   CO2 22 11/25/2015 0730   CO2 24 12/10/2014 1130   GLUCOSE 128* 11/25/2015 0730   GLUCOSE 141*  12/10/2014 1130   GLUCOSE 218* 12/11/2012 1349   BUN 16 11/25/2015 0730   BUN 18.3 12/10/2014 1130   CREATININE 0.96 11/25/2015 1012   CREATININE 1.02* 09/08/2015 1459   CREATININE 1.2* 12/10/2014 1130   CALCIUM 10.1 11/25/2015 0730   CALCIUM 10.1 12/10/2014 1130   PROT 6.4* 11/25/2015 0730   PROT 7.1 12/10/2014 1130   ALBUMIN 2.8* 11/25/2015 0730   ALBUMIN 3.5 12/10/2014 1130   AST 14* 11/25/2015 0730   AST 10 12/10/2014 1130   ALT 8* 11/25/2015 0730   ALT <6 12/10/2014 1130   ALKPHOS 44 11/25/2015 0730   ALKPHOS 38* 12/10/2014 1130   BILITOT 0.2* 11/25/2015 0730   BILITOT 0.28 12/10/2014 1130   GFRNONAA 51* 11/25/2015 1012   GFRAA 59* 11/25/2015 1012    No results found for: SPEP, UPEP  Lab Results  Component Value Date   WBC 4.1 11/25/2015   NEUTROABS 3.6 09/08/2015   HGB 9.3* 11/25/2015   HCT 29.1* 11/25/2015   MCV 71.7* 11/25/2015   PLT 148* 11/25/2015  Chemistry      Component Value Date/Time   NA 141 11/25/2015 0730   NA 143 12/10/2014 1130   K 4.0 11/25/2015 0730   K 3.7 12/10/2014 1130   CL 111 11/25/2015 0730   CL 105 12/11/2012 1349   CO2 22 11/25/2015 0730   CO2 24 12/10/2014 1130   BUN 16 11/25/2015 0730   BUN 18.3 12/10/2014 1130   CREATININE 0.96 11/25/2015 1012   CREATININE 1.02* 09/08/2015 1459   CREATININE 1.2* 12/10/2014 1130      Component Value Date/Time   CALCIUM 10.1 11/25/2015 0730   CALCIUM 10.1 12/10/2014 1130   ALKPHOS 44 11/25/2015 0730   ALKPHOS 38* 12/10/2014 1130   AST 14* 11/25/2015 0730   AST 10 12/10/2014 1130   ALT 8* 11/25/2015 0730   ALT <6 12/10/2014 1130   BILITOT 0.2* 11/25/2015 0730   BILITOT 0.28 12/10/2014 1130      ASSESSMENT & PLAN:  History of colon cancer She has no evidence of recurrence. Continue history and examination only.    Breast cancer, left breast Clinically, she has no recurrence. She will discontinue tamoxifen after her current prescription is finished to complete 5 years with  of adjuvant treatment.  So far, she tolerated treatment well without any side effects.  with her being a long-term cancer survivor with bilateral mastectomy and early-stage disease, I will discontinue follow-up from the clinic here      Anemia of chronic illness This is likely anemia of chronic disease and possibly thalassemia trait. The patient denies recent history of bleeding such as epistaxis, hematuria or hematochezia. She is asymptomatic from the anemia. We will observe for now.  She does not require transfusion now.      No orders of the defined types were placed in this encounter.   All questions were answered. The patient knows to call the clinic with any problems, questions or concerns. No barriers to learning was detected. I spent 15 minutes counseling the patient face to face. The total time spent in the appointment was 20 minutes and more than 50% was on counseling and review of test results     [facility], [place], MD 12/09/2015 1:44 PM

## 2015-12-10 NOTE — Telephone Encounter (Signed)
Attempted to call Ms. LeSearr, left a voicemail   Would like the company to send in all data intermittently, approximately every week to every other week. Would like them to send data day of if BP>190/95, O2 <88%, or weight less than 100 pounds (stated I wasn't too concerned about this parameter.  Thanks, [name], MD [facility] Family Medicine Resident  12/10/2015, 12:00 PM

## 2015-12-13 DIAGNOSIS — E1151 Type 2 diabetes mellitus with diabetic peripheral angiopathy without gangrene: Secondary | ICD-10-CM | POA: Diagnosis not present

## 2015-12-13 DIAGNOSIS — N390 Urinary tract infection, site not specified: Secondary | ICD-10-CM | POA: Diagnosis not present

## 2015-12-13 DIAGNOSIS — M6281 Muscle weakness (generalized): Secondary | ICD-10-CM | POA: Diagnosis not present

## 2015-12-13 DIAGNOSIS — I5042 Chronic combined systolic (congestive) and diastolic (congestive) heart failure: Secondary | ICD-10-CM | POA: Diagnosis not present

## 2015-12-13 DIAGNOSIS — E1140 Type 2 diabetes mellitus with diabetic neuropathy, unspecified: Secondary | ICD-10-CM | POA: Diagnosis not present

## 2015-12-13 DIAGNOSIS — I110 Hypertensive heart disease with heart failure: Secondary | ICD-10-CM | POA: Diagnosis not present

## 2015-12-13 NOTE — Telephone Encounter (Signed)
Lavell Anchors, RN with [employer] left voice message for Dr. Lorenso Courier on nurse line stating that patient has agreed to the teleahealth. The equipment had been ordered.  She also wanted to report that patient told her she fell last Thursday but was not hurt.. Please give her a call at [phone].  Derl Barrow, RN

## 2015-12-14 DIAGNOSIS — I110 Hypertensive heart disease with heart failure: Secondary | ICD-10-CM | POA: Diagnosis not present

## 2015-12-14 DIAGNOSIS — E1140 Type 2 diabetes mellitus with diabetic neuropathy, unspecified: Secondary | ICD-10-CM | POA: Diagnosis not present

## 2015-12-14 DIAGNOSIS — I5042 Chronic combined systolic (congestive) and diastolic (congestive) heart failure: Secondary | ICD-10-CM | POA: Diagnosis not present

## 2015-12-14 DIAGNOSIS — M6281 Muscle weakness (generalized): Secondary | ICD-10-CM | POA: Diagnosis not present

## 2015-12-14 DIAGNOSIS — E1151 Type 2 diabetes mellitus with diabetic peripheral angiopathy without gangrene: Secondary | ICD-10-CM | POA: Diagnosis not present

## 2015-12-14 DIAGNOSIS — N390 Urinary tract infection, site not specified: Secondary | ICD-10-CM | POA: Diagnosis not present

## 2015-12-14 NOTE — Telephone Encounter (Signed)
Returned call. Telehealth will be set up next week with the parameters I placed.    Ms. [name] did report a fall. Per RN, she did not injury herself. No tenderness or bruising noted on her exam. Patient walking normally with her rolling walker.  [name], MD [facility] Family Medicine Resident  12/14/2015, 1:00 PM

## 2015-12-20 ENCOUNTER — Telehealth: Payer: Self-pay | Admitting: Family Medicine

## 2015-12-20 DIAGNOSIS — I110 Hypertensive heart disease with heart failure: Secondary | ICD-10-CM | POA: Diagnosis not present

## 2015-12-20 DIAGNOSIS — N390 Urinary tract infection, site not specified: Secondary | ICD-10-CM | POA: Diagnosis not present

## 2015-12-20 DIAGNOSIS — E1151 Type 2 diabetes mellitus with diabetic peripheral angiopathy without gangrene: Secondary | ICD-10-CM | POA: Diagnosis not present

## 2015-12-20 DIAGNOSIS — E1140 Type 2 diabetes mellitus with diabetic neuropathy, unspecified: Secondary | ICD-10-CM | POA: Diagnosis not present

## 2015-12-20 DIAGNOSIS — M6281 Muscle weakness (generalized): Secondary | ICD-10-CM | POA: Diagnosis not present

## 2015-12-20 DIAGNOSIS — I5042 Chronic combined systolic (congestive) and diastolic (congestive) heart failure: Secondary | ICD-10-CM | POA: Diagnosis not present

## 2015-12-20 NOTE — Telephone Encounter (Signed)
Please have the patient come in for an evaluation.  [name] Patten, MD [facility] Family Medicine Resident  12/20/2015, 5:04 PM

## 2015-12-20 NOTE — Telephone Encounter (Signed)
Daughter states that while cleaning her mother last night she noticed that pt had what looks to be her uterus coming out of her vaginal opening. No pain or discomfort per pt. Daughter just wanted PCP to be aware of this.

## 2015-12-21 ENCOUNTER — Ambulatory Visit (INDEPENDENT_AMBULATORY_CARE_PROVIDER_SITE_OTHER): Payer: Medicare Other | Admitting: Podiatry

## 2015-12-21 ENCOUNTER — Encounter: Payer: Self-pay | Admitting: Podiatry

## 2015-12-21 DIAGNOSIS — M79676 Pain in unspecified toe(s): Secondary | ICD-10-CM

## 2015-12-21 DIAGNOSIS — B351 Tinea unguium: Secondary | ICD-10-CM | POA: Diagnosis not present

## 2015-12-21 NOTE — Progress Notes (Signed)
Patient ID: [name], female   DOB: 22-Apr-1924, 82 y.o.   MRN: [id number]  Subjective: This patient presents again complaining of thickened and elongated toenails walking wearing shoes and requests toenail debridement. Patient's caregiver present to treatment room  Objective: Orientated 3 No open skin lesions bilaterally The toenails are hypertrophic, elongated, deformed, discolored and tender direct palpation 6-10  Assessment: Symptomatic onychomycoses 6-10 Type II diabetic  Plan: Debridement toenails 6-10 mechanically and electronically without any bleeding  Reappoint 3 months

## 2015-12-21 NOTE — Patient Instructions (Signed)
Diabetes and Foot Care Diabetes may cause you to have problems because of poor blood supply (circulation) to your feet and legs. This may cause the skin on your feet to become thinner, break easier, and heal more slowly. Your skin may become dry, and the skin may peel and crack. You may also have nerve damage in your legs and feet causing decreased feeling in them. You may not notice minor injuries to your feet that could lead to infections or more serious problems. Taking care of your feet is one of the most important things you can do for yourself.  HOME CARE INSTRUCTIONS  Wear shoes at all times, even in the house. Do not go barefoot. Bare feet are easily injured.  Check your feet daily for blisters, cuts, and redness. If you cannot see the bottom of your feet, use a mirror or ask someone for help.  Wash your feet with warm water (do not use hot water) and mild soap. Then pat your feet and the areas between your toes until they are completely dry. Do not soak your feet as this can dry your skin.  Apply a moisturizing lotion or petroleum jelly (that does not contain alcohol and is unscented) to the skin on your feet and to dry, brittle toenails. Do not apply lotion between your toes.  Trim your toenails straight across. Do not dig under them or around the cuticle. File the edges of your nails with an emery board or nail file.  Do not cut corns or calluses or try to remove them with medicine.  Wear clean socks or stockings every day. Make sure they are not too tight. Do not wear knee-high stockings since they may decrease blood flow to your legs.  Wear shoes that fit properly and have enough cushioning. To break in new shoes, wear them for just a few hours a day. This prevents you from injuring your feet. Always look in your shoes before you put them on to be sure there are no objects inside.  Do not cross your legs. This may decrease the blood flow to your feet.  If you find a minor scrape,  cut, or break in the skin on your feet, keep it and the skin around it clean and dry. These areas may be cleansed with mild soap and water. Do not cleanse the area with peroxide, alcohol, or iodine.  When you remove an adhesive bandage, be sure not to damage the skin around it.  If you have a wound, look at it several times a day to make sure it is healing.  Do not use heating pads or hot water bottles. They may burn your skin. If you have lost feeling in your feet or legs, you may not know it is happening until it is too late.  Make sure your health care provider performs a complete foot exam at least annually or more often if you have foot problems. Report any cuts, sores, or bruises to your health care provider immediately. SEEK MEDICAL CARE IF:   You have an injury that is not healing.  You have cuts or breaks in the skin.  You have an ingrown nail.  You notice redness on your legs or feet.  You feel burning or tingling in your legs or feet.  You have pain or cramps in your legs and feet.  Your legs or feet are numb.  Your feet always feel cold. SEEK IMMEDIATE MEDICAL CARE IF:   There is increasing redness,   swelling, or pain in or around a wound.  There is a red line that goes up your leg.  Pus is coming from a wound.  You develop a fever or as directed by your health care provider.  You notice a bad smell coming from an ulcer or wound.   This information is not intended to replace advice given to you by your health care provider. Make sure you discuss any questions you have with your health care provider.   Document Released: 10/27/2000 Document Revised: 07/02/2013 Document Reviewed: 04/08/2013 Elsevier Interactive Patient Education 2016 Elsevier Inc.  

## 2015-12-22 DIAGNOSIS — I5042 Chronic combined systolic (congestive) and diastolic (congestive) heart failure: Secondary | ICD-10-CM | POA: Diagnosis not present

## 2015-12-22 DIAGNOSIS — M6281 Muscle weakness (generalized): Secondary | ICD-10-CM | POA: Diagnosis not present

## 2015-12-22 DIAGNOSIS — I110 Hypertensive heart disease with heart failure: Secondary | ICD-10-CM | POA: Diagnosis not present

## 2015-12-22 DIAGNOSIS — E1151 Type 2 diabetes mellitus with diabetic peripheral angiopathy without gangrene: Secondary | ICD-10-CM | POA: Diagnosis not present

## 2015-12-22 DIAGNOSIS — E1140 Type 2 diabetes mellitus with diabetic neuropathy, unspecified: Secondary | ICD-10-CM | POA: Diagnosis not present

## 2015-12-22 DIAGNOSIS — N390 Urinary tract infection, site not specified: Secondary | ICD-10-CM | POA: Diagnosis not present

## 2015-12-22 NOTE — Telephone Encounter (Signed)
Lavell Anchors, RN with [facility] called stating she completed home visit today to help the tele-a-health.  When she arrived the machine was still in the box.  Patient told her she was going to send it back because she did not know how to use it.  LeSearr tried to tell her about the machine but Patient then stated she didn't know how to put the blood pressure cuff on so she was just going to send it back.  She did not want to use it.  Patient has been reluctant for the home health team to visit at times.  If they come out they have to come at 1 PM or she has an excuse for them not to come.  BP was high today 170/86 with taking blood pressure medication.  Please advice. Please give LeSearr a call at [phone].  Derl Barrow, RN

## 2015-12-22 NOTE — Telephone Encounter (Addendum)
Pt's BP was 170/86, was not re-checked because the patient was eager for the RN to leave.  The RN did look at all her medications after our last visit to make sure everything is up to date, unfortunately, [name] has refused to let the RN do her pill box to ensure everything is in there.   Additionally, patient refusing to do tele-health BP monitoring (has done it in the past). RN will continue to go out and see her twice a week and get BPs. If they continue to be elevated will increase amlodipine.  [name], MD [facility] Family Medicine Resident  12/22/2015, 2:08 PM

## 2015-12-23 NOTE — Telephone Encounter (Signed)
Left message on voicemail for patient daughter to call back and schedule appointment for patient.

## 2015-12-27 DIAGNOSIS — E1151 Type 2 diabetes mellitus with diabetic peripheral angiopathy without gangrene: Secondary | ICD-10-CM | POA: Diagnosis not present

## 2015-12-27 DIAGNOSIS — M6281 Muscle weakness (generalized): Secondary | ICD-10-CM | POA: Diagnosis not present

## 2015-12-27 DIAGNOSIS — N390 Urinary tract infection, site not specified: Secondary | ICD-10-CM | POA: Diagnosis not present

## 2015-12-27 DIAGNOSIS — E1140 Type 2 diabetes mellitus with diabetic neuropathy, unspecified: Secondary | ICD-10-CM | POA: Diagnosis not present

## 2015-12-27 DIAGNOSIS — I110 Hypertensive heart disease with heart failure: Secondary | ICD-10-CM | POA: Diagnosis not present

## 2015-12-27 DIAGNOSIS — I5042 Chronic combined systolic (congestive) and diastolic (congestive) heart failure: Secondary | ICD-10-CM | POA: Diagnosis not present

## 2015-12-28 ENCOUNTER — Encounter: Payer: Self-pay | Admitting: Family Medicine

## 2015-12-28 ENCOUNTER — Ambulatory Visit (INDEPENDENT_AMBULATORY_CARE_PROVIDER_SITE_OTHER): Payer: Medicare Other | Admitting: Family Medicine

## 2015-12-28 VITALS — BP 145/52 | HR 74 | Temp 98.1°F | Ht 59.0 in | Wt 116.0 lb

## 2015-12-28 DIAGNOSIS — E1140 Type 2 diabetes mellitus with diabetic neuropathy, unspecified: Secondary | ICD-10-CM | POA: Diagnosis not present

## 2015-12-28 DIAGNOSIS — M6281 Muscle weakness (generalized): Secondary | ICD-10-CM | POA: Diagnosis not present

## 2015-12-28 DIAGNOSIS — N390 Urinary tract infection, site not specified: Secondary | ICD-10-CM | POA: Diagnosis not present

## 2015-12-28 DIAGNOSIS — I5042 Chronic combined systolic (congestive) and diastolic (congestive) heart failure: Secondary | ICD-10-CM | POA: Diagnosis not present

## 2015-12-28 DIAGNOSIS — E1151 Type 2 diabetes mellitus with diabetic peripheral angiopathy without gangrene: Secondary | ICD-10-CM | POA: Diagnosis not present

## 2015-12-28 DIAGNOSIS — N819 Female genital prolapse, unspecified: Secondary | ICD-10-CM | POA: Diagnosis not present

## 2015-12-28 DIAGNOSIS — I110 Hypertensive heart disease with heart failure: Secondary | ICD-10-CM | POA: Diagnosis not present

## 2015-12-28 DIAGNOSIS — N814 Uterovaginal prolapse, unspecified: Secondary | ICD-10-CM | POA: Diagnosis not present

## 2015-12-28 DIAGNOSIS — R112 Nausea with vomiting, unspecified: Secondary | ICD-10-CM | POA: Diagnosis not present

## 2015-12-28 NOTE — Patient Instructions (Signed)
Pelvic Organ Prolapse Pelvic organ prolapse is the stretching, bulging, or dropping of pelvic organs into an abnormal position. It happens when the muscles and tissues that surround and support pelvic structures are stretched or weak. Pelvic organ prolapse can involve:  Vagina (vaginal prolapse).  Uterus (uterine prolapse).  Bladder (cystocele).  Rectum (rectocele).  Intestines (enterocele). When organs other than the vagina are involved, they often bulge into the vagina or protrude from the vagina, depending on how severe the prolapse is. CAUSES Causes of this condition include:  Pregnancy, labor, and childbirth.  Long-lasting (chronic) cough.  Chronic constipation.  Obesity.  Past pelvic surgery.  Aging. During and after menopause, a decreased production of the hormone estrogen can weaken pelvic ligaments and muscles.  Consistently lifting more than 50 lb (23 kg).  Buildup of fluid in the abdomen due to certain diseases and other conditions. SYMPTOMS Symptoms of this condition include:  Loss of bladder control when you cough, sneeze, strain, and exercise (stress incontinence). This may be worse immediately following childbirth, and it may gradually improve over time.  Feeling pressure in your pelvis or vagina. This pressure may increase when you cough or when you are having a bowel movement.  A bulge that protrudes from the opening of your vagina or against your vaginal wall. If your uterus protrudes through the opening of your vagina and rubs against your clothing, you may also experience soreness, ulcers, infection, pain, and bleeding.  Increased effort to have a bowel movement or urinate.  Pain in your low back.  Pain, discomfort, or disinterest in sexual intercourse.  Repeated bladder infections (urinary tract infections).  Difficulty inserting or inability to insert a tampon or applicator. In some people, this condition does not cause any  symptoms. DIAGNOSIS Your health care provider may perform an internal and external vaginal and rectal exam. During the exam, you may be asked to cough and strain while you are lying down, sitting, and standing up. Your health care provider will determine if other tests are required, such as bladder function tests. TREATMENT In most cases, this condition needs to be treated only if it produces symptoms. No treatment is guaranteed to correct the prolapse or relieve the symptoms completely. Treatment may include:  Lifestyle changes, such as:  Avoiding drinking beverages that contain caffeine.  Increasing your intake of high-fiber foods. This can help to decrease constipation and straining during bowel movements.  Emptying your bladder at scheduled times (bladder training therapy). This can help to reduce or avoid urinary incontinence.  Losing weight if you are overweight or obese.  Estrogen. Estrogen may help mild prolapse by increasing the strength and tone of pelvic floor muscles.  Kegel exercises. These may help mild cases of prolapse by strengthening and tightening the muscles of the pelvic floor.  Pessary insertion. A pessary is a soft, flexible device that is placed into your vagina by your health care provider to help support the vaginal walls and keep pelvic organs in place.  Surgery. This is often the only form of treatment for severe prolapse. Different types of surgeries are available. HOME CARE INSTRUCTIONS  Wear a sanitary pad or absorbent product if you have urinary incontinence.  Avoid heavy lifting and straining with exercise and work. Do not hold your breath when you perform mild to moderate lifting and exercise activities. Limit your activities as directed by your health care provider.  Take medicines only as directed by your health care provider.  Perform Kegel exercises as directed by your   health care provider.  If you have a pessary, take care of it as directed by  your health care provider. SEEK MEDICAL CARE IF:  Your symptoms interfere with your daily activities or sex life.  You need medicine to help with the discomfort.  You notice bleeding from the vagina that is not related to your period.  You have a fever.  You have pain or bleeding when you urinate.  You have bleeding when you have a bowel movement.  You lose urine when you have sex.  You have chronic constipation.  You have a pessary that falls out.  You have vaginal discharge that has a bad smell.  You have low abdominal pain or cramping that is unusual for you.   This information is not intended to replace advice given to you by your health care provider. Make sure you discuss any questions you have with your health care provider.   Document Released: 05/27/2014 Document Reviewed: 05/27/2014 Elsevier Interactive Patient Education 2016 Elsevier Inc.  

## 2015-12-28 NOTE — Progress Notes (Signed)
Patient ID: [name], female   DOB: 1924/07/20, 82 y.o.   MRN: [id number]    Subjective: CC: "vaginal issues" HPI: Patient is a 82 y.o. female with a complicated PMHx presenting to clinic today for concerns of something protruding into her vagina.  Her daughter, [name], was cleaning her up on 2/5 and noticed that there was something protruding from her vagina and was concerned it was her uterus. The patient has been largely asymptomatic from this but was concerned about this. Patient denies pressure in her vagina. No problems with urination. She wears Depends as she sometimes can't get to the bathroom in time. No sensation of incomplete voiding. She has been getting constipated which started 2-3 week ago. She has a BM every 2-3 days. MiraLax isn't helping much any more, she takes 5 capfuls twice daily.  She also takes Milk of magnesium every morning as well (which is baseline for).  She's had 13 children, all vaginally.     Social History: uses snuff  ROS: All other systems reviewed and are negative.  Past Medical History Patient Active Problem List   Diagnosis Date Noted  . Prolapse of female pelvic organs 12/28/2015  . Leg weakness, bilateral 12/08/2015  . UTI (lower urinary tract infection) 11/25/2015  . Weakness of right lower extremity 11/25/2015  . Dry eye 10/28/2015  . Pyelonephritis 09/16/2015  . (HFpEF) heart failure with preserved ejection fraction ([place])   . CAD in native artery   . Emesis, persistent   . Chest congestion 09/08/2015  . Gout 05/14/2015  . UTI (urinary tract infection) 04/15/2015  . Acute delirium 04/15/2015  . Fever 04/07/2015  . Hypertension 04/07/2015  . Dyslipidemia   . Essential hypertension   . Seasonal allergies 02/10/2015  . Nausea with vomiting 02/10/2015  . Flank pain 02/10/2015  . Stroke-like symptoms   . Stroke-like episode ([place]) 01/25/2015  . Syncope and collapse 01/25/2015  . Accelerated hypertension   . Hypertensive emergency  09/19/2014  . Anemia of chronic illness 08/19/2014  . Acute superficial venous thrombosis of right lower extremity 08/19/2014  . Hypercalcemia 06/09/2014  . Breast cancer, left breast ([place]) 06/09/2014  . Breast cancer, right breast ([place]) 06/09/2014  . Edema of left lower extremity 03/30/2014  . PAD (peripheral artery disease) ([place]) 03/30/2014  . History of colon cancer 12/11/2013  . Neck pain 10/22/2013  . History of fall 05/15/2013  . Memory deficit 05/15/2013  . Bilateral leg pain 03/07/2013  . Trigger thumb of right hand 03/07/2013  . Lower extremity edema 02/19/2013  . Stricture and stenosis of esophagus 10/27/2012  . Poor social situation 09/13/2012  . Low back pain 08/07/2012  . Musculoskeletal chest pain 07/10/2012  . Thoracic back pain 05/24/2012  . Weight loss 04/03/2012  . Insomnia 09/25/2011  . Tobacco abuse 06/02/2011  . History of breast cancer in female, bilateral.  Mastectomies 10/24/2010. 04/26/2011  . Hiatal hernia 03/22/2011  . Colon polyp 03/22/2011  . Poor circulation 03/22/2011  . 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 NEUROPATHY 08/25/2009  . ANEMIA-IRON DEFICIENCY 04/29/2007  . HLD (hyperlipidemia) 01/29/2007  . Diabetes type 2, controlled ([place]) 01/10/2007  . HYPERTENSION, BENIGN SYSTEMIC 01/10/2007  . Coronary atherosclerosis 01/10/2007  . Chronic systolic heart failure ([place]) 01/10/2007  . Asthma 01/10/2007  . Reflux esophagitis 01/10/2007  . Osteoarthritis, multiple sites 01/10/2007    Medications- reviewed and updated Current Outpatient Prescriptions  Medication Sig Dispense Refill  . acetaminophen (TYLENOL) 500 MG tablet Take 500 mg by mouth every  6 (six) hours as needed for moderate pain (pain).    Marland Kitchen albuterol (PROVENTIL HFA;VENTOLIN HFA) 108 (90 BASE) MCG/ACT inhaler Inhale 2 puffs into the lungs every 6 (six) hours as needed. For shortness of breath 1 Inhaler 6  . amLODipine (NORVASC) 5 MG tablet Take 1 tablet (5 mg total) by mouth  daily. 30 tablet 0  . aspirin (ASPIRIN CHILDRENS) 81 MG chewable tablet Chew 81 mg by mouth every other day.     . Blood Pressure KIT Use the blood pressure kit to check you blood pressure twice a day 1 each 0  . carvedilol (COREG) 12.5 MG tablet TAKE 1 TABLET BY MOUTH TWICE A DAY WITH FOOD 60 tablet 1  . clopidogrel (PLAVIX) 75 MG tablet TAKE 1 TABLET (75 MG TOTAL) BY MOUTH DAILY. 30 tablet 2  . diclofenac sodium (VOLTAREN) 1 % GEL APPLY 2 grams topically 4 TIMES DAILY AS NEEDED FOR PAIN 100 g 1  . gabapentin (NEURONTIN) 300 MG capsule TAKE ONE CAPSULE BY MOUTH TWICE A DAY 60 capsule 1  . glucose blood test strip Use as instructed Dx. Code 250.02 100 each 12  . loratadine (CLARITIN) 10 MG tablet TAKE 1/2 TABLET BY MOUTH DAILY 15 tablet 0  . losartan (COZAAR) 50 MG tablet TAKE 1 TABLET (50 MG TOTAL) BY MOUTH AT BEDTIME. (Patient taking differently: TAKE 1 TABLET (50 MG TOTAL) BY MOUTH IN THE MORNING) 30 tablet 0  . magnesium hydroxide (MILK OF MAGNESIA) 400 MG/5ML suspension Take 30 mLs by mouth daily as needed for mild constipation.    . nicotine (NICODERM CQ - DOSED IN MG/24 HR) 7 mg/24hr patch Place 1 patch (7 mg total) onto the skin daily as needed (nicotine withdrawal). 28 patch 0  . PATADAY 0.2 % SOLN APPLY 1 DROP TO EYE DAILY. (Patient taking differently: APPLY 1 DROP TO BOTH EYES DAILY.) 2.5 mL 0  . ranitidine (ZANTAC) 150 MG capsule TAKE 1 CAPSULE BY MOUTH 2 TIMES DAILY AS NEEDED FOR heartburn 60 capsule 2  . rosuvastatin (CRESTOR) 20 MG tablet Take 1 tablet (20 mg total) by mouth at bedtime. 30 tablet 2  . traMADol (ULTRAM) 50 MG tablet Take 1 tablet (50 mg total) by mouth every 8 (eight) hours as needed for severe pain. 75 tablet 0  . VOLTAREN 1 % GEL APPLY 2 GRAMS TOPICALLY 4 (FOUR) TIMES DAILY AS NEEDED FOR PAIN 100 g 0  . [DISCONTINUED] cetirizine (ZYRTEC) 5 MG tablet Take 1 tablet (5 mg total) by mouth daily. Take at night time as it can cause some drowsiness 30 tablet 4   No  current facility-administered medications for this visit.    Objective: Office vital signs reviewed. BP 145/52 mmHg  Pulse 74  Temp(Src) 98.1 F (36.7 C) (Oral)  Ht 4' 11" (1.499 m)  Wt 116 lb (52.617 kg)  BMI 23.42 kg/m2   Physical Examination:  General: Awake, alert in NAD GYN:  External genitalia within normal limits.  Vaginal mucosa pink, moist, normal rugae.  Nonfriable cervix without lesions. Uterus protrudes almost to the vaginal introitus with cough. Additionally, concerns for rectocele vs pseudorectocele with poor posterior wall musculature on exam.  No discharge or bleeding note.    Assessment/Plan: Prolapse of female pelvic organs Patient presenting with what appears to be a uterine prolapse, 2nd degree when coughing as well as rectocele vs pseudorecetocele on my exam. Given she endorses worsening constipation I have referred her to gynecology. Would like to avoid surgery in this 82 year  old patient with multiple comorbidities. Discussed appropriate hydration and continued MiraLax and Milk of Mag.     Orders Placed This Encounter  Procedures  . Ambulatory referral to Gynecology    Referral Priority:  Routine    Referral Type:  Consultation    Referral Reason:  Specialty Services Required    Requested Specialty:  Gynecology    Number of Visits Requested:  1    No orders of the defined types were placed in this encounter.    [name] PGY-2, [place]

## 2015-12-29 NOTE — Assessment & Plan Note (Signed)
Patient presenting with what appears to be a uterine prolapse, 2nd degree when coughing as well as rectocele vs pseudorecetocele on my exam. Given she endorses worsening constipation I have referred her to gynecology. Would like to avoid surgery in this 82 year old patient with multiple comorbidities. Discussed appropriate hydration and continued MiraLax and Milk of Mag.

## 2015-12-30 ENCOUNTER — Other Ambulatory Visit: Payer: Self-pay | Admitting: Family Medicine

## 2015-12-30 DIAGNOSIS — E1140 Type 2 diabetes mellitus with diabetic neuropathy, unspecified: Secondary | ICD-10-CM | POA: Diagnosis not present

## 2015-12-30 DIAGNOSIS — N390 Urinary tract infection, site not specified: Secondary | ICD-10-CM | POA: Diagnosis not present

## 2015-12-30 DIAGNOSIS — I110 Hypertensive heart disease with heart failure: Secondary | ICD-10-CM | POA: Diagnosis not present

## 2015-12-30 DIAGNOSIS — E1151 Type 2 diabetes mellitus with diabetic peripheral angiopathy without gangrene: Secondary | ICD-10-CM | POA: Diagnosis not present

## 2015-12-30 DIAGNOSIS — M6281 Muscle weakness (generalized): Secondary | ICD-10-CM | POA: Diagnosis not present

## 2015-12-30 DIAGNOSIS — I5042 Chronic combined systolic (congestive) and diastolic (congestive) heart failure: Secondary | ICD-10-CM | POA: Diagnosis not present

## 2015-12-30 MED ORDER — CARVEDILOL 12.5 MG PO TABS: 12.5000 mg | ORAL_TABLET | Freq: Two times a day (BID) | ORAL | Status: DC

## 2015-12-30 MED ORDER — LOSARTAN POTASSIUM 50 MG PO TABS
ORAL_TABLET | ORAL | Status: DC
Start: 2015-12-30 — End: 2016-11-20

## 2015-12-30 MED ORDER — AMLODIPINE BESYLATE 5 MG PO TABS: 5.0000 mg | ORAL_TABLET | Freq: Every day | ORAL | Status: DC

## 2015-12-30 MED ORDER — GABAPENTIN 300 MG PO CAPS: 300.0000 mg | ORAL_CAPSULE | Freq: Two times a day (BID) | ORAL | Status: DC

## 2015-12-30 NOTE — Telephone Encounter (Signed)
[facility]:  States  [facility] has been requesting refills on meds and says we are not responding. Did not see any requests other than those sent today.  [place] wanted "all current medications refilled and the list reconciled with the pharmacy"

## 2015-12-30 NOTE — Telephone Encounter (Signed)
I have refilled all requests that [facility] has sent to our office. She has not had refills on several medications (such as losartan) in several month as she's never requested a refill. This is why I requested Ms. Lesearr, RN, to compare the medications Ms. Crail daughter was putting into her pill box with the list I provided her back at the end of January 2017.  I have repeatedly requested that Ms. Wirt and her daughter bring in ALL her medications to her appts, however this never occurs.    Thanks, [name], MD [facility] Family Medicine Resident  12/30/2015, 8:49 PM

## 2015-12-31 ENCOUNTER — Other Ambulatory Visit: Payer: Self-pay | Admitting: *Deleted

## 2015-12-31 DIAGNOSIS — M546 Pain in thoracic spine: Secondary | ICD-10-CM

## 2016-01-02 NOTE — Telephone Encounter (Signed)
Patient has an appt with me on 2/24. That's when she's due for a refill on the tramadol so I will just provide her the Rx at that time.  Thanks, [name], MD [facility] Family Medicine Resident  01/02/2016, 8:22 PM

## 2016-01-04 DIAGNOSIS — I110 Hypertensive heart disease with heart failure: Secondary | ICD-10-CM | POA: Diagnosis not present

## 2016-01-04 DIAGNOSIS — E1140 Type 2 diabetes mellitus with diabetic neuropathy, unspecified: Secondary | ICD-10-CM | POA: Diagnosis not present

## 2016-01-04 DIAGNOSIS — N390 Urinary tract infection, site not specified: Secondary | ICD-10-CM | POA: Diagnosis not present

## 2016-01-04 DIAGNOSIS — E1151 Type 2 diabetes mellitus with diabetic peripheral angiopathy without gangrene: Secondary | ICD-10-CM | POA: Diagnosis not present

## 2016-01-04 DIAGNOSIS — M6281 Muscle weakness (generalized): Secondary | ICD-10-CM | POA: Diagnosis not present

## 2016-01-04 DIAGNOSIS — I5042 Chronic combined systolic (congestive) and diastolic (congestive) heart failure: Secondary | ICD-10-CM | POA: Diagnosis not present

## 2016-01-05 DIAGNOSIS — M6281 Muscle weakness (generalized): Secondary | ICD-10-CM | POA: Diagnosis not present

## 2016-01-05 DIAGNOSIS — N390 Urinary tract infection, site not specified: Secondary | ICD-10-CM | POA: Diagnosis not present

## 2016-01-05 DIAGNOSIS — E1140 Type 2 diabetes mellitus with diabetic neuropathy, unspecified: Secondary | ICD-10-CM | POA: Diagnosis not present

## 2016-01-05 DIAGNOSIS — I5042 Chronic combined systolic (congestive) and diastolic (congestive) heart failure: Secondary | ICD-10-CM | POA: Diagnosis not present

## 2016-01-05 DIAGNOSIS — I110 Hypertensive heart disease with heart failure: Secondary | ICD-10-CM | POA: Diagnosis not present

## 2016-01-05 DIAGNOSIS — E1151 Type 2 diabetes mellitus with diabetic peripheral angiopathy without gangrene: Secondary | ICD-10-CM | POA: Diagnosis not present

## 2016-01-07 ENCOUNTER — Ambulatory Visit: Payer: Medicare Other | Admitting: Family Medicine

## 2016-01-07 ENCOUNTER — Other Ambulatory Visit: Payer: Self-pay | Admitting: Family Medicine

## 2016-01-13 DIAGNOSIS — I5042 Chronic combined systolic (congestive) and diastolic (congestive) heart failure: Secondary | ICD-10-CM | POA: Diagnosis not present

## 2016-01-13 DIAGNOSIS — N390 Urinary tract infection, site not specified: Secondary | ICD-10-CM | POA: Diagnosis not present

## 2016-01-13 DIAGNOSIS — M6281 Muscle weakness (generalized): Secondary | ICD-10-CM | POA: Diagnosis not present

## 2016-01-13 DIAGNOSIS — E1140 Type 2 diabetes mellitus with diabetic neuropathy, unspecified: Secondary | ICD-10-CM | POA: Diagnosis not present

## 2016-01-13 DIAGNOSIS — I110 Hypertensive heart disease with heart failure: Secondary | ICD-10-CM | POA: Diagnosis not present

## 2016-01-13 DIAGNOSIS — E1151 Type 2 diabetes mellitus with diabetic peripheral angiopathy without gangrene: Secondary | ICD-10-CM | POA: Diagnosis not present

## 2016-01-14 ENCOUNTER — Other Ambulatory Visit: Payer: Self-pay | Admitting: Family Medicine

## 2016-01-14 NOTE — Telephone Encounter (Signed)
As they were advised 2 weeks ago when they requested tramadol to early, she was supposed to come in to her appt on 2/24 to discuss her pain to determine if we needed to continue Voltaren gel and tramadol. While voltaren is a gel, it still has side effects.  Please have her make an appt with myself or someone else if I do not have availability (we do not have a pain contract).  Thanks, [name], MD [facility] Family Medicine Resident  01/14/2016, 1:43 PM

## 2016-01-14 NOTE — Telephone Encounter (Signed)
Refill request for Tramadol. Also, daughter states that she has been calling for 2 weeks to get pt's Voltaren Gel refilled. On 12/30/15, refill request was sent by pharmacy, but denied by PCP. Gel had not been refill since 09/2015. Daughter would like  to know why it was denied.

## 2016-01-15 IMAGING — CR DG CHEST 2V
2 series · 2 of 2 positions shown · non-contrast
Comparison: Chest radiograph 09/16/2015, multiple prior exams
reviewed

CLINICAL DATA: 89-year-old female with cough and generalized
weakness today.

EXAM:
CHEST  2 VIEW

[chest lat]
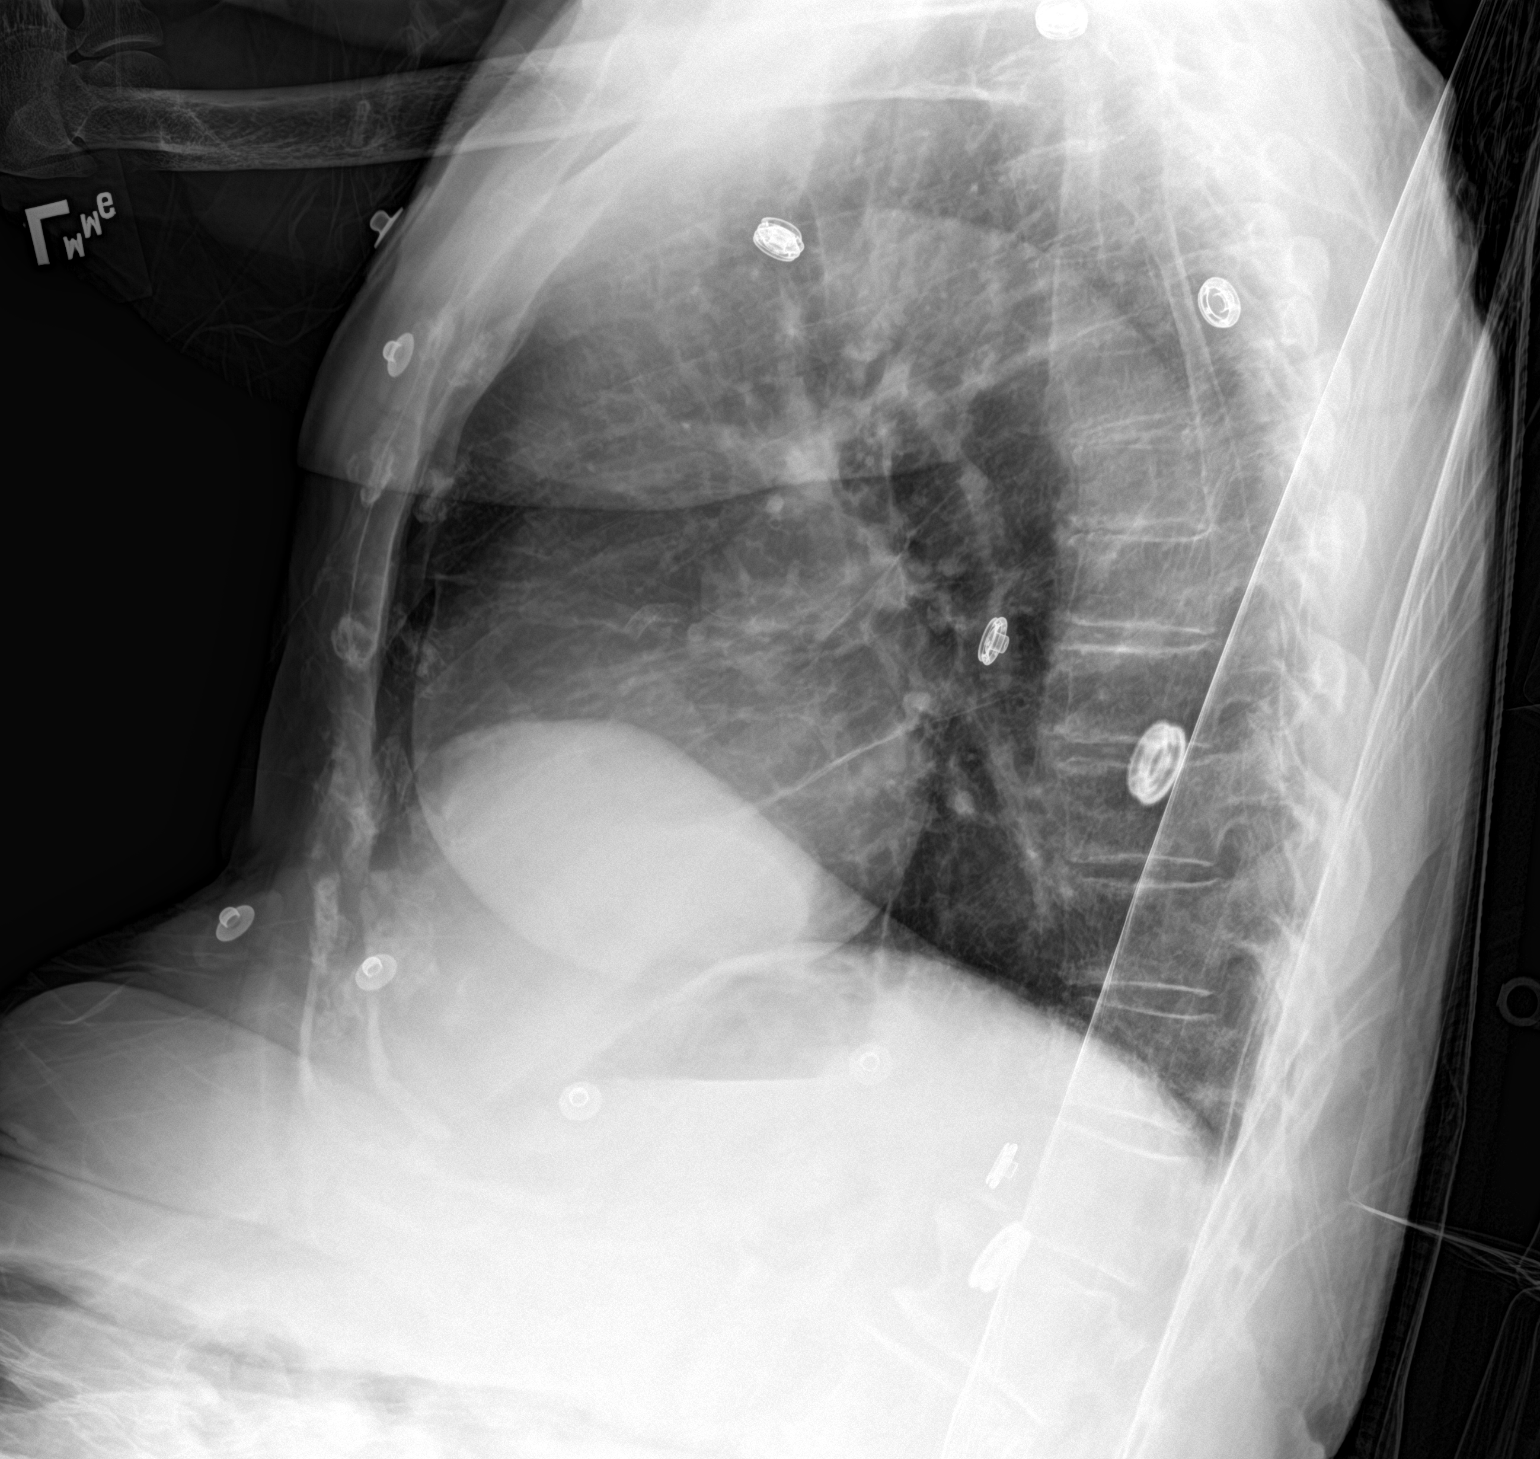

[chest ap]
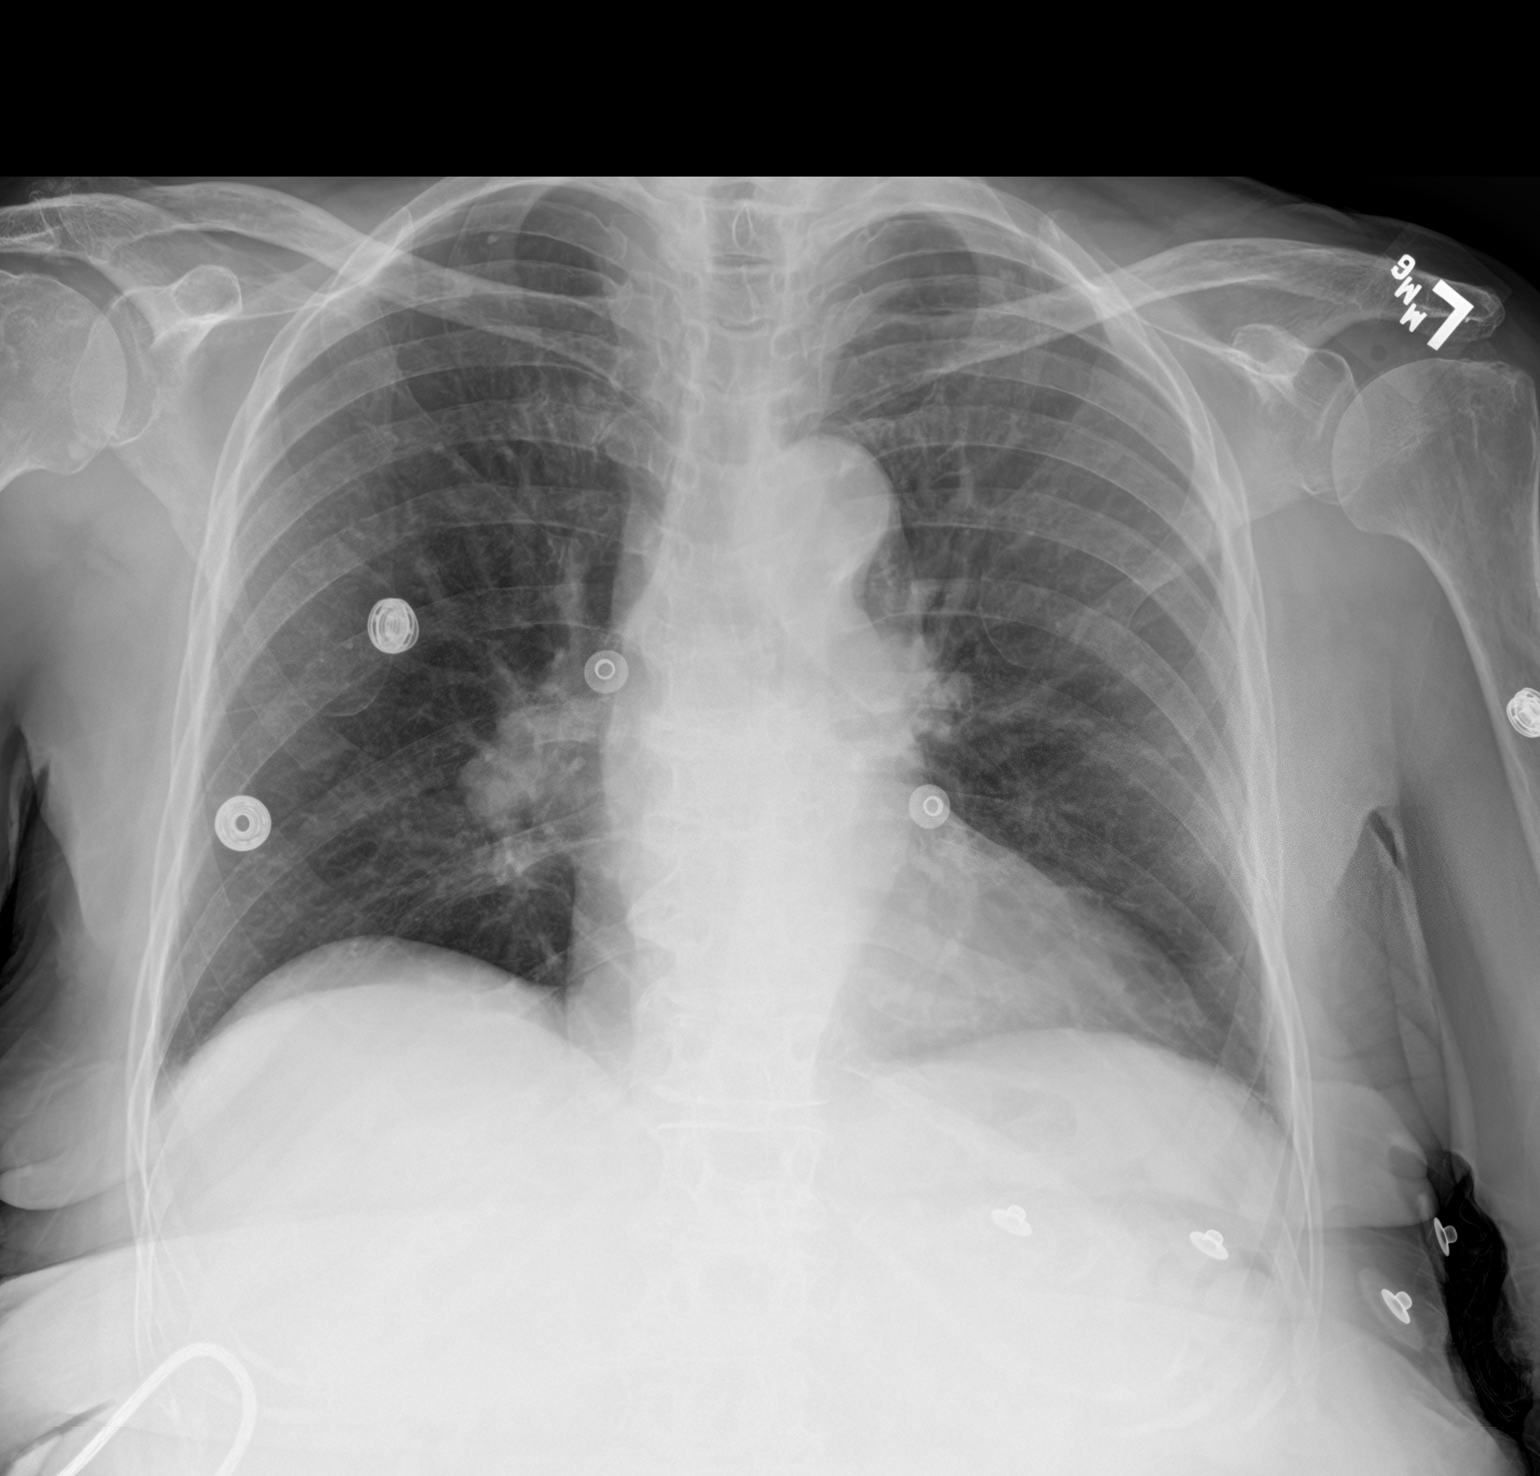

[2 of 2 positions shown; findings below may reference images not displayed]

FINDINGS: Cardiomediastinal contours are unchanged with borderline
cardiomegaly and tortuous thoracic aorta. No pulmonary edema,
airspace consolidation, pleural effusion or pneumothorax. No acute
osseous abnormality.
IMPRESSION: No acute pulmonary process.

## 2016-01-16 IMAGING — MR MR HEAD W/O CM
8 of 10 series · 36 of 48 positions shown · non-contrast
Comparison: Prior CT from 11/24/2015.

CLINICAL DATA: Initial evaluation for increased right lower
extremity weakness.

EXAM:
MRI HEAD WITHOUT CONTRAST
TECHNIQUE: Multiplanar, multiecho pulse sequences of the brain and surrounding
structures were obtained without intravenous contrast.

[Series 3: T1 · sagittal · 5.0mm · 0.47mm/px · 3 of 23 slices shown]
[im 1/23]
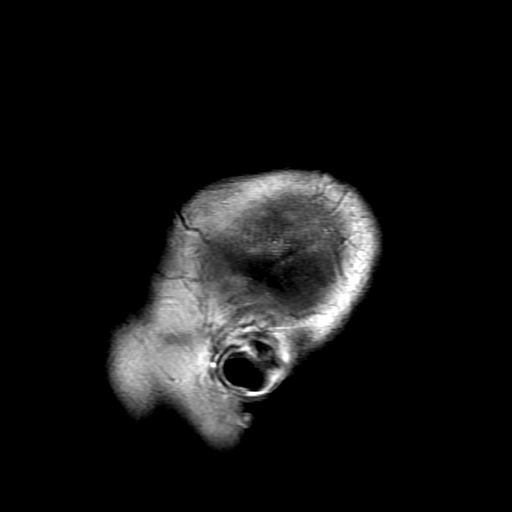
[im 12/23]
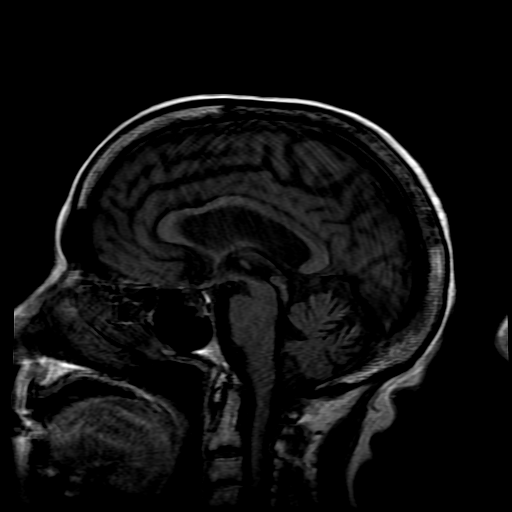
[im 23/23]
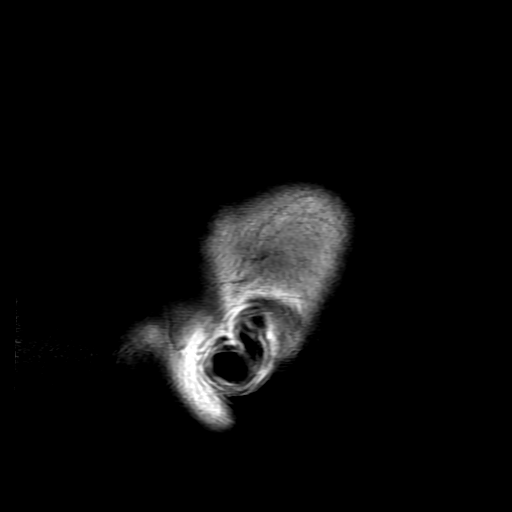

[Series 4: DWI · axial · 3.0mm · 1.09mm/px · z∈[-41,+82]mm · 9 of 86 slices shown (1 of 4)]
[im 1/86]
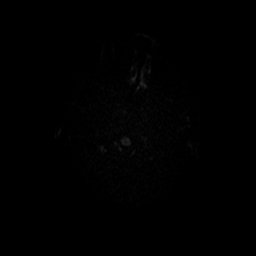
[im 11/86]
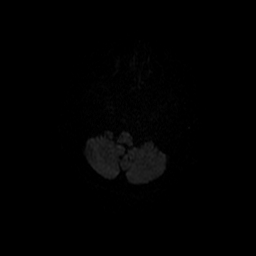
[im 22/86]
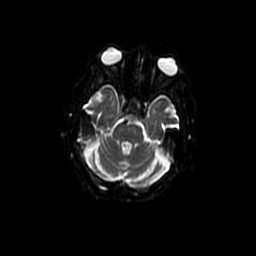
[im 32/86]
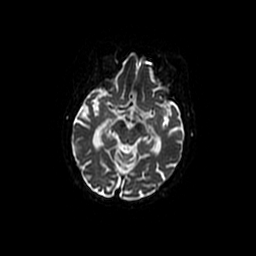
[im 43/86]
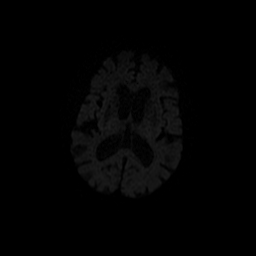
[im 54/86]
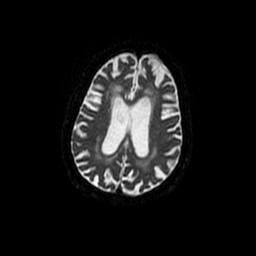
[im 64/86]
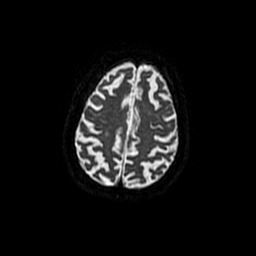
[im 75/86]
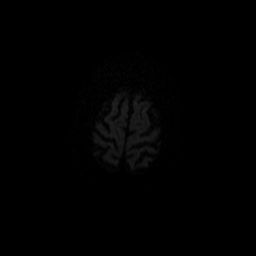
[im 86/86]
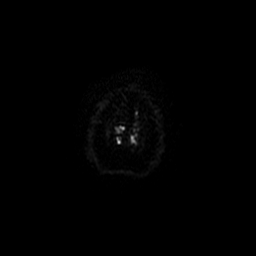

[Series 5: T2 · axial · 5.0mm · 0.43mm/px · z∈[-55,+73]mm · 2 of 23 slices shown (1 of 2)]
[im 1/23]
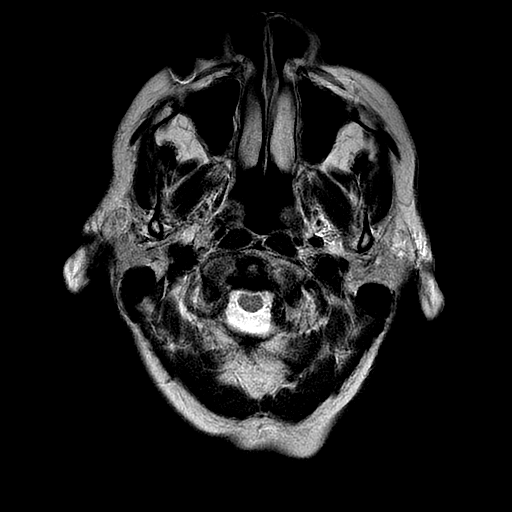
[im 23/23]
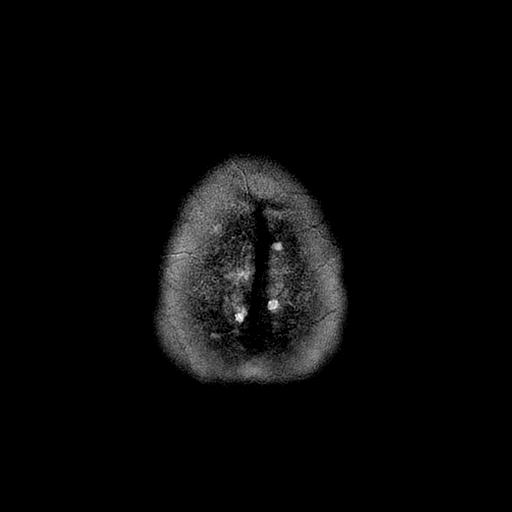

[Series 6: FLAIR · axial · 5.0mm · 0.43mm/px · z∈[-55,+73]mm · 2 of 23 slices shown]
[im 1/23]
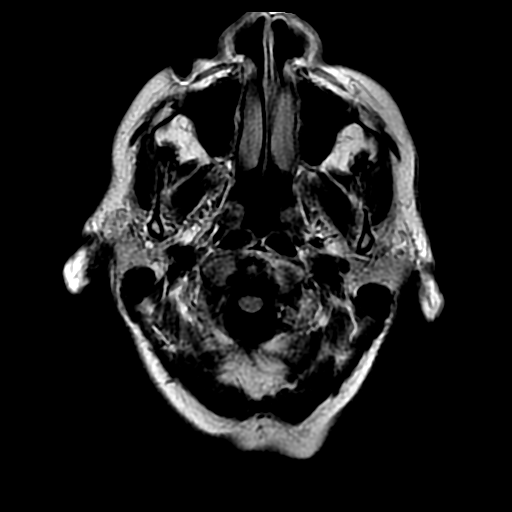
[im 23/23]
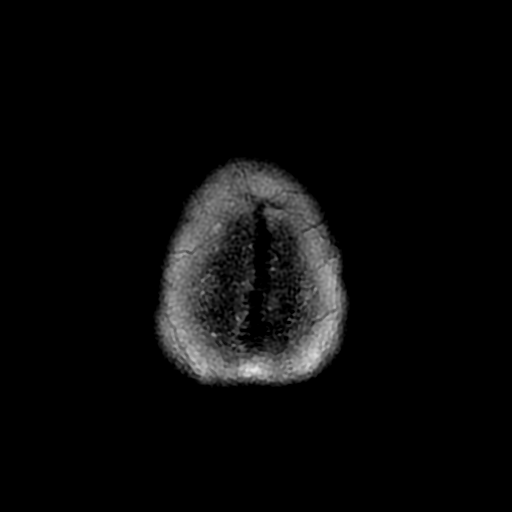

[Series 7: DWI · coronal · 5.0mm · 1.09mm/px · 8 of 70 slices shown (2 of 4)]
[im 1/70]
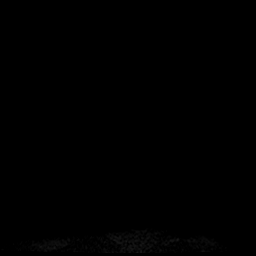
[im 10/70]
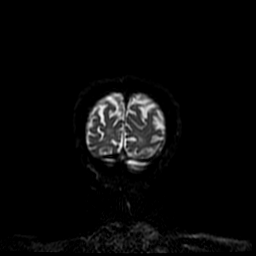
[im 20/70]
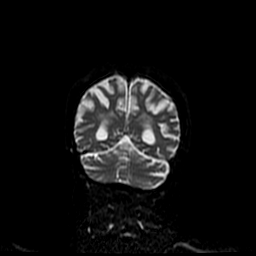
[im 30/70]
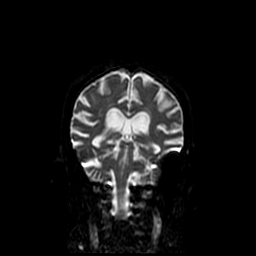
[im 40/70]
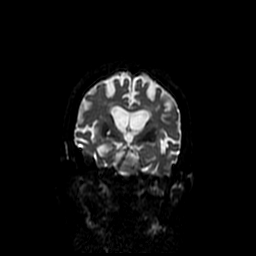
[im 50/70]
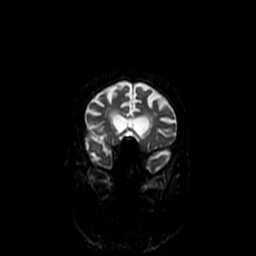
[im 60/70]
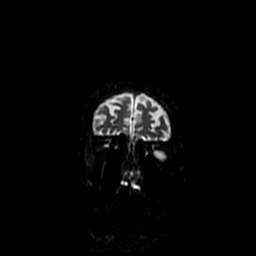
[im 70/70]
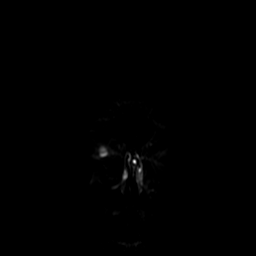

[Series 10: T2 · coronal · 5.0mm · 0.43mm/px · 3 of 30 slices shown (2 of 2)]
[im 1/30]
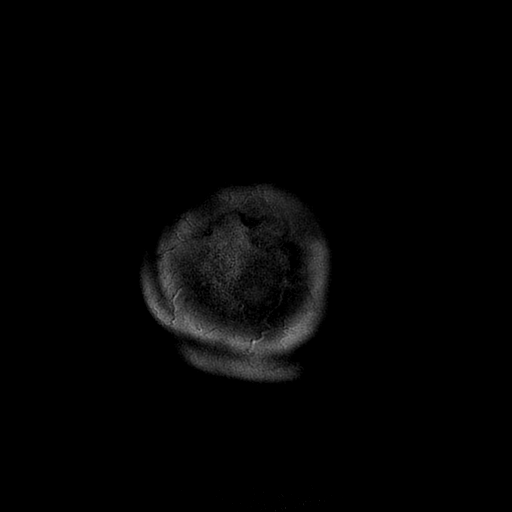
[im 15/30]
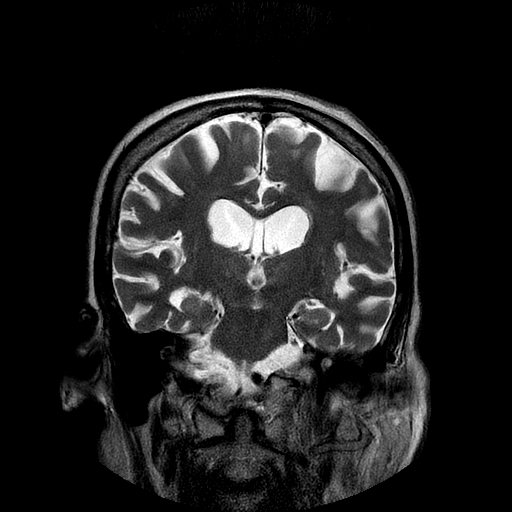
[im 30/30]
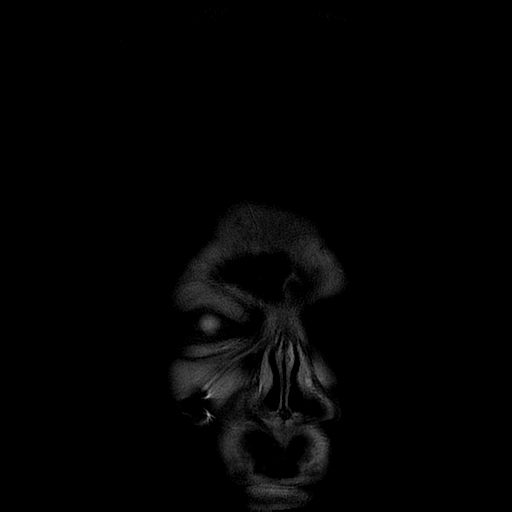

[Series 400: DWI · axial · 3.0mm · 1.09mm/px · z∈[-41,+82]mm · 5 of 43 slices shown (3 of 4)]
[im 1/43]
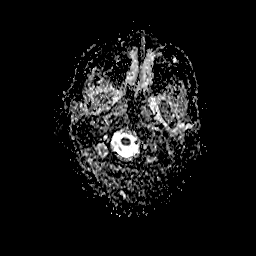
[im 11/43]
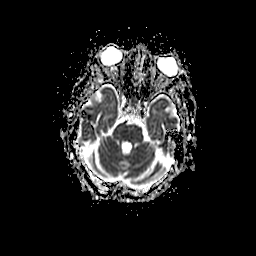
[im 22/43]
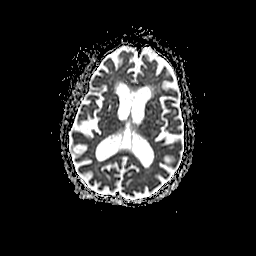
[im 32/43]
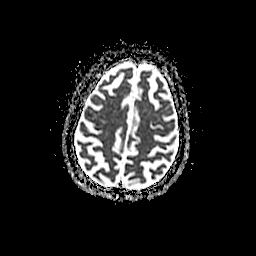
[im 43/43]
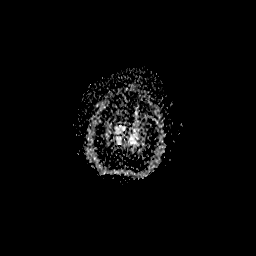

[Series 700: DWI · coronal · 5.0mm · 1.09mm/px · 4 of 35 slices shown (4 of 4)]
[im 1/35]
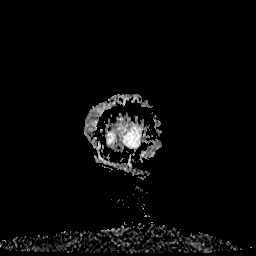
[im 12/35]
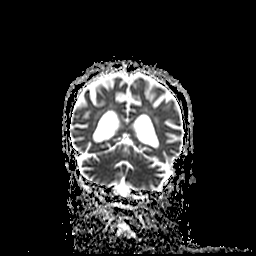
[im 23/35]
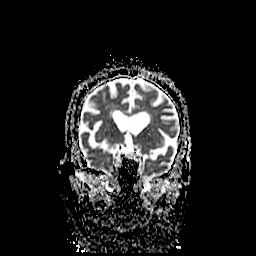
[im 35/35]
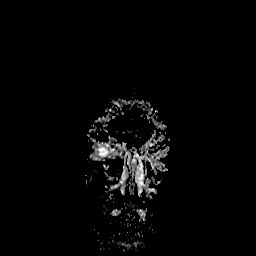

[36 of 48 positions shown; findings below may reference images not displayed]

FINDINGS: Study degraded by motion artifact.

Diffuse prominence of the CSF containing spaces compatible with
generalized age-related cerebral atrophy. Scattered T2/FLAIR
hyperintensity within the periventricular and deep white matter both
cerebral hemispheres most consistent with chronic small vessel
ischemic disease, mild for patient age. Similar changes present
within the pons. Remote lacunar infarcts within the bilateral basal
ganglia and thalami. Small remote left cerebellar infarct noted as
well.

No abnormal foci of restricted diffusion to suggest acute
intracranial infarct. Gray-white matter differentiation maintained.
Major intracranial vascular flow voids preserved. Mild
vertebrobasilar dolichoectasia present. Atheromatous disease present
within knee dominant left vertebral artery. Cavernous right ICA is
ectatic. No acute or chronic intracranial hemorrhage.

No mass lesion, midline shift, or mass effect. Ventricular
prominence related to global parenchymal volume loss present without
hydrocephalus. No extra-axial fluid collection.

Craniocervical junction within normal limits. Mild multilevel
degenerative spondylolysis present within the visualized upper
cervical spine.

Pituitary gland within normal limits. No acute abnormality about the
orbits. Sequela prior bilateral lens extraction noted.

Paranasal sinuses are clear. No mastoid effusion. Inner ear
structures normal.

Bone marrow signal intensity within normal limits. No scalp soft
tissue abnormality.
IMPRESSION: 1. No acute intracranial infarct or other process identified.
2. Age-related cerebral atrophy with mild chronic small vessel
ischemic disease and multiple remote lacunar infarcts as above.

## 2016-01-17 NOTE — Telephone Encounter (Signed)
Pt informed and scheduled an appt. Doretta Remmert, CMA  

## 2016-01-19 ENCOUNTER — Ambulatory Visit (INDEPENDENT_AMBULATORY_CARE_PROVIDER_SITE_OTHER): Payer: Medicare Other | Admitting: Internal Medicine

## 2016-01-19 ENCOUNTER — Encounter (HOSPITAL_COMMUNITY): Payer: Self-pay

## 2016-01-19 ENCOUNTER — Emergency Department (HOSPITAL_COMMUNITY)
Admission: EM | Admit: 2016-01-19 | Discharge: 2016-01-20 | Disposition: A | Discharge status: Home / Self Care | Payer: Medicare Other | Attending: Emergency Medicine | Admitting: Emergency Medicine

## 2016-01-19 ENCOUNTER — Encounter: Payer: Self-pay | Admitting: Internal Medicine

## 2016-01-19 ENCOUNTER — Encounter: Payer: Self-pay | Admitting: Licensed Clinical Social Worker

## 2016-01-19 VITALS — BP 180/66 | HR 95 | Temp 97.9°F | Wt 115.6 lb

## 2016-01-19 DIAGNOSIS — Z7902 Long term (current) use of antithrombotics/antiplatelets: Secondary | ICD-10-CM | POA: Insufficient documentation

## 2016-01-19 DIAGNOSIS — Z8701 Personal history of pneumonia (recurrent): Secondary | ICD-10-CM | POA: Insufficient documentation

## 2016-01-19 DIAGNOSIS — I252 Old myocardial infarction: Secondary | ICD-10-CM | POA: Insufficient documentation

## 2016-01-19 DIAGNOSIS — J45909 Unspecified asthma, uncomplicated: Secondary | ICD-10-CM | POA: Insufficient documentation

## 2016-01-19 DIAGNOSIS — Z79899 Other long term (current) drug therapy: Secondary | ICD-10-CM | POA: Diagnosis not present

## 2016-01-19 DIAGNOSIS — F32A Depression, unspecified: Secondary | ICD-10-CM | POA: Diagnosis present

## 2016-01-19 DIAGNOSIS — R45851 Suicidal ideations: Secondary | ICD-10-CM

## 2016-01-19 DIAGNOSIS — Z85038 Personal history of other malignant neoplasm of large intestine: Secondary | ICD-10-CM | POA: Diagnosis not present

## 2016-01-19 DIAGNOSIS — R29898 Other symptoms and signs involving the musculoskeletal system: Secondary | ICD-10-CM

## 2016-01-19 DIAGNOSIS — M546 Pain in thoracic spine: Secondary | ICD-10-CM

## 2016-01-19 DIAGNOSIS — M1990 Unspecified osteoarthritis, unspecified site: Secondary | ICD-10-CM | POA: Diagnosis not present

## 2016-01-19 DIAGNOSIS — Z7982 Long term (current) use of aspirin: Secondary | ICD-10-CM | POA: Insufficient documentation

## 2016-01-19 DIAGNOSIS — I2510 Atherosclerotic heart disease of native coronary artery without angina pectoris: Secondary | ICD-10-CM | POA: Diagnosis not present

## 2016-01-19 DIAGNOSIS — I10 Essential (primary) hypertension: Secondary | ICD-10-CM | POA: Insufficient documentation

## 2016-01-19 DIAGNOSIS — I5020 Unspecified systolic (congestive) heart failure: Secondary | ICD-10-CM | POA: Diagnosis not present

## 2016-01-19 DIAGNOSIS — Z862 Personal history of diseases of the blood and blood-forming organs and certain disorders involving the immune mechanism: Secondary | ICD-10-CM | POA: Diagnosis not present

## 2016-01-19 DIAGNOSIS — F329 Major depressive disorder, single episode, unspecified: Secondary | ICD-10-CM | POA: Insufficient documentation

## 2016-01-19 DIAGNOSIS — E785 Hyperlipidemia, unspecified: Secondary | ICD-10-CM | POA: Insufficient documentation

## 2016-01-19 DIAGNOSIS — G629 Polyneuropathy, unspecified: Secondary | ICD-10-CM | POA: Diagnosis not present

## 2016-01-19 DIAGNOSIS — Z853 Personal history of malignant neoplasm of breast: Secondary | ICD-10-CM | POA: Diagnosis not present

## 2016-01-19 DIAGNOSIS — E119 Type 2 diabetes mellitus without complications: Secondary | ICD-10-CM | POA: Diagnosis not present

## 2016-01-19 DIAGNOSIS — R112 Nausea with vomiting, unspecified: Secondary | ICD-10-CM | POA: Diagnosis not present

## 2016-01-19 LAB — CBC WITH DIFFERENTIAL/PLATELET
Basophils Absolute: 0 10*3/uL (ref 0.0–0.1)
Basophils Relative: 0 %
EOS ABS: 0.2 10*3/uL (ref 0.0–0.7)
EOS PCT: 3 %
HCT: 35.6 % — ABNORMAL LOW (ref 36.0–46.0)
Hemoglobin: 11.2 g/dL — ABNORMAL LOW (ref 12.0–15.0)
LYMPHS ABS: 2.4 10*3/uL (ref 0.7–4.0)
Lymphocytes Relative: 38 %
MCH: 22.6 pg — AB (ref 26.0–34.0)
MCHC: 31.5 g/dL (ref 30.0–36.0)
MCV: 71.9 fL — ABNORMAL LOW (ref 78.0–100.0)
MONOS PCT: 5 %
Monocytes Absolute: 0.3 10*3/uL (ref 0.1–1.0)
Neutro Abs: 3.4 10*3/uL (ref 1.7–7.7)
Neutrophils Relative %: 53 %
PLATELETS: 180 10*3/uL (ref 150–400)
RBC: 4.95 MIL/uL (ref 3.87–5.11)
RDW: 16 % — AB (ref 11.5–15.5)
WBC: 6.4 10*3/uL (ref 4.0–10.5)

## 2016-01-19 LAB — COMPREHENSIVE METABOLIC PANEL
ALK PHOS: 52 U/L (ref 38–126)
ALT: 10 U/L — AB (ref 14–54)
AST: 17 U/L (ref 15–41)
Albumin: 3.8 g/dL (ref 3.5–5.0)
Anion gap: 10 (ref 5–15)
BUN: 16 mg/dL (ref 6–20)
CHLORIDE: 106 mmol/L (ref 101–111)
CO2: 24 mmol/L (ref 22–32)
CREATININE: 0.99 mg/dL (ref 0.44–1.00)
Calcium: 10.9 mg/dL — ABNORMAL HIGH (ref 8.9–10.3)
GFR calc Af Amer: 56 mL/min — ABNORMAL LOW (ref >60)
GFR, EST NON AFRICAN AMERICAN: 49 mL/min — AB (ref >60)
Glucose, Bld: 267 mg/dL — ABNORMAL HIGH (ref 65–99)
Potassium: 3.9 mmol/L (ref 3.5–5.1)
Sodium: 140 mmol/L (ref 135–145)
Total Bilirubin: 0.3 mg/dL (ref 0.3–1.2)
Total Protein: 8.1 g/dL (ref 6.5–8.1)

## 2016-01-19 LAB — ETHANOL

## 2016-01-19 MED ORDER — ASPIRIN 81 MG PO CHEW
81.0000 mg | CHEWABLE_TABLET | ORAL | Status: DC
  Administered 2016-01-20: 81 mg via ORAL
  Filled 2016-01-19: qty 1

## 2016-01-19 MED ORDER — CLOPIDOGREL BISULFATE 75 MG PO TABS
75.0000 mg | ORAL_TABLET | Freq: Every day | ORAL | Status: DC
  Administered 2016-01-20: 75 mg via ORAL
  Filled 2016-01-19 (×2): qty 1

## 2016-01-19 MED ORDER — AMLODIPINE BESYLATE 5 MG PO TABS
5.0000 mg | ORAL_TABLET | Freq: Every day | ORAL | Status: DC
  Administered 2016-01-20: 5 mg via ORAL
  Filled 2016-01-19: qty 1

## 2016-01-19 MED ORDER — ACETAMINOPHEN 500 MG PO TABS: 500.0000 mg | ORAL_TABLET | Freq: Four times a day (QID) | ORAL | Status: DC | PRN

## 2016-01-19 MED ORDER — MAGNESIUM HYDROXIDE 400 MG/5ML PO SUSP: 30.0000 mL | Freq: Every day | ORAL | Status: DC | PRN

## 2016-01-19 MED ORDER — ROSUVASTATIN CALCIUM 20 MG PO TABS
20.0000 mg | ORAL_TABLET | Freq: Every day | ORAL | Status: DC
  Administered 2016-01-20: 20 mg via ORAL
  Filled 2016-01-19 (×2): qty 1

## 2016-01-19 MED ORDER — TRAMADOL HCL 50 MG PO TABS
50.0000 mg | ORAL_TABLET | Freq: Three times a day (TID) | ORAL | Status: DC | PRN
  Administered 2016-01-20: 50 mg via ORAL
  Filled 2016-01-19: qty 1

## 2016-01-19 MED ORDER — TRAMADOL HCL 50 MG PO TABS: 50.0000 mg | ORAL_TABLET | Freq: Three times a day (TID) | ORAL | Status: DC | PRN

## 2016-01-19 MED ORDER — LOSARTAN POTASSIUM 50 MG PO TABS
50.0000 mg | ORAL_TABLET | Freq: Every day | ORAL | Status: DC
  Administered 2016-01-20: 50 mg via ORAL
  Filled 2016-01-19: qty 1

## 2016-01-19 MED ORDER — CARVEDILOL 12.5 MG PO TABS
12.5000 mg | ORAL_TABLET | Freq: Two times a day (BID) | ORAL | Status: DC
  Administered 2016-01-20: 12.5 mg via ORAL
  Filled 2016-01-19 (×3): qty 1

## 2016-01-19 MED ORDER — GABAPENTIN 300 MG PO CAPS
300.0000 mg | ORAL_CAPSULE | Freq: Two times a day (BID) | ORAL | Status: DC
  Administered 2016-01-20 (×2): 300 mg via ORAL
  Filled 2016-01-19 (×2): qty 1

## 2016-01-19 NOTE — Progress Notes (Signed)
CSW was notified by Nurse CM that patient and family are interested in facility information. CSW met with patient at bedside. Patient was alert and oriented. Daughter was present. CSW provided patient and daughter with a list of ALF. Patient accepted information.  Patient denies SI,HI, and AVH at this time. Patient states that she lives with her daughter, and confirms that she has a good support system. Patient reports that she has home health.   Willette Brace [phone] ED CSW 01/19/2016 10:05 PM

## 2016-01-19 NOTE — Patient Instructions (Signed)
It was nice meeting you and your daughter today Ms. Alegria!  Continuing physical therapy will be very helpful for your knee weakness. I will place another physical therapy referral so you can continue to receive these services.   You can continue to take tramadol as needed for your back pain.   If you have any questions or concerns, please feel free to call the clinic.   Be well,  Dr. Avon Gully

## 2016-01-19 NOTE — Progress Notes (Signed)
Patient ID: [name], female   DOB: 28-Mar-1924, 82 y.o.   MRN: [id number]  CSW informed by Dr. Avon Gully that patient is SI with three different plans.  CSW assisted MD with completed required paper work for Involuntary commitment (IVC).   Papers completed and faxed to Magistrate's office. Requesting service and transport to take patient to [facility] for an evaluation.     Call to Magistrate's office, spoke to [place] who confirmed all information was correct. He will send GCP to office to transport patient.   Casimer Lanius. [place] [employer]  [phone] 4:55 PM

## 2016-01-19 NOTE — BH Assessment (Signed)
Assessment completed. Consulted Patriciaann Clan, PA-C who recommended geri-psych placement. TTS to seek placement. Informed Dr. Ralene Bathe of the recommendation.

## 2016-01-19 NOTE — Assessment & Plan Note (Signed)
Stable. Patient reports physical therapy is helping. No recent falls and ambulating well with rolling walker.  - Will place referral for continued home PT

## 2016-01-19 NOTE — Assessment & Plan Note (Signed)
Stable. No concerns for neurological dysfunction, as denies saddle anesthesia, bowel or bladder incontinence, numbness or tingling. Able to ambulate and no pain on physical exam. Known thoracic scoliosis documented on xray in 2015.  - Will refill tramadol

## 2016-01-19 NOTE — Progress Notes (Signed)
Subjective:    Patient ID: [name], female    DOB: 06-09-1924, 82 y.o.   MRN: [id number]  HPI  Patient presents for weakness in knees, lower back pain, and suicidal ideation.   Suicidal ideation Patient's daughter reports that patient has threatened to kill herself recently. Patient reports that she no longer wants to live because she is a burden to her family and they do not want to take care of her. These feelings have become stronger in the past few weeks. She was previously living with her [name], however [name] was hospitalized for CHF exacerbation, and is now living with her son until she recovers. As such, there is no one staying with the patient at night. Many of her other children have offered to allow her to live with them, however she does not want to leave her own house.  Patient reports that her initial suicide plan was to lay down in the middle of the street in hopes of being hit by a car. She also considered calling the police and faking a problem, so that she could steal the policeman's gun and shoot herself. She denies any guns in her own house, but does have a friend with a gun that she has also considered using. She says she has not asked the friend because she is afraid he will say that she cannot use his gun. Finally, patient reports thoughts of stabbing herself in the chest with the butcher knife in her kitchen.  Patient has no history of psychiatric disorders and has no history of past suicidal or homicidal ideations. One of her daughters has a history of depression, but there is no other family history of mental health disease.   Knee weakness Pain worse in R knee after standing. Patient says it feels like her legs are going to give out under her, however if she is able to hold on to her walker or grip something else (like a countertop or bed) she is fine. Denies falls. Is able to ambulate well with a rolling walker. Is seen by PT at home twice weekly and feels that  this does help. Denies pain, erythema, swelling of knees. Had R knee replacement in early 2000s.   Lower back pain Pain worse right before bed and first thing in morning. Patient currently taking Tylenol and Tramadol (only at night) with good pain control. Denies numbness or tingling in larges. Endorses only weakness in knees. No bowel or bladder incontinence.   Social Hx: Currently living alone. Uses snuff nightly. Does not drink alcohol.   Review of Systems See HPI.     Objective:   Physical Exam  Constitutional: She is oriented to person, place, and time. She appears well-developed and well-nourished. No distress.  HENT:  Head: Normocephalic and atraumatic.  Pulmonary/Chest: Effort normal. No respiratory distress.  Musculoskeletal:  No TTP, swelling, erythema, or warmth of knees bilaterally. No LE tenderness or edema. Able to ambulate well with rolling walker.   Neurological: She is alert and oriented to person, place, and time.  Skin: Skin is warm and dry.  Psychiatric:  Actively suicidal (repeatedly yelling and threatening to go home and stab herself in the chest with a butcher knife throughout encounter)      Assessment & Plan:  Leg weakness, bilateral Stable. Patient reports physical therapy is helping. No recent falls and ambulating well with rolling walker.  - Will place referral for continued home PT  Thoracic back pain Stable. No concerns for  neurological dysfunction, as denies saddle anesthesia, bowel or bladder incontinence, numbness or tingling. Able to ambulate and no pain on physical exam. Known thoracic scoliosis documented on xray in 2015.  - Will refill tramadol  Suicidal ideation Patient reporting current suicidal thoughts with three detailed plans and access to guns and a large butcher knife. Lives alone, so would not be supervised if she went home today. Refusing hospital admission. As patient is actively suicidal, will involuntarily commit to [facility] health unit for further management.    Greater than 50% of face-to-face time was spent in counseling and coordination of care with the patient.   [name] Hector, MD PGY-1 [name] Pager [phone]

## 2016-01-19 NOTE — BH Assessment (Addendum)
Tele Assessment Note   [name] is an 82 y.o. female presented to [facility] accompanied by her daughter due to expressing suicidal ideations. Pt stated "I told my daughter that I wanted to call the police and have them come even though nothing is wrong with me and grab their gun and shoot myself". "I can't stand pain so I wouldn't do that". Pt also reported  that she informed her daughter that "if yall take me to the hospital I will get a knife and hit myself right here (pointing to her abdomen)". "I wouldn't hurt nobody nor would I hurt anybody's feelings if I can help it". Pt denied having access to weapons and did not report any pending criminal charges. Pt reported denies any previous suicide attempts or self-injurious behaviors. Pt denied any previous mental health treatment or psychiatric hospitalizations. Pt did not report any alcohol or illicit substance use. Pt reported that she can complete her ADL's; however she has an aide that helps her with her ADL's.  Pt's daughter reported that pt is acting out due to the recent transition. She reported that pt can no longer live alone and will be moving in with her daughter. She reported that pt would like to remain in  her home.  Inpatient treatment is recommended.   Diagnosis: Unspecified Depressive Disorder   Past Medical History:  Past Medical History  Diagnosis Date  . Breast cancer ([place]) 10/2010    Invasive Ductal Carcinoma, s/p bilateral mastectomy, now on Tamoxifen. Followed by Dr Jamse Arn.   . Diabetes mellitus   . Hypertension   . Hyperlipidemia   . Anemia     Iron def anemia  . CAD (coronary artery disease)   . CHF (congestive heart failure) (HCC)     Systolic   . History of colon cancer   . Neuropathy ([place])   . Allergy   . GERD (gastroesophageal reflux disease)   . Hiatal hernia   . Diverticulosis   . Asthma   . Arthritis   . H/O: GI bleed   . Breast cancer ([place]) 2011    s/p Bilateral masectomy  . Shortness of breath   .  Pneumonia 03/2012  . Carotid artery aneurysm ([place]) 08/2010    right ICA, 5 x 73mm  . DDD (degenerative disc disease), cervical   . Colon cancer ([place])   . Colon cancer ([place]) 12/11/2013  . Myocardial infarction ([place])   . Hypercalcemia 06/09/2014    Past Surgical History  Procedure Laterality Date  . Breast masectomy  10/2010    Bilateral masectomy by Dr Lucia Gaskins s/p invasive ductal carcinoma.  . Cardiac  catherization  2007    Severe 3-vessel disease.  EF 20-25%.  . Esophagogastroduodenoscopy  2001    Esophageal Tear  . Total knee arthroplasty  2001  . Esophagogastroduodenoscopy  10/27/2012    Procedure: ESOPHAGOGASTRODUODENOSCOPY (EGD);  Surgeon: Irene Shipper, MD;  [facility] END[facility]COPY;  Service: Endoscopy;  Laterality: N/A;  pat  . Breast surgery    . Joint replacement Right 2001    Knee    Family History:  Family History  Problem Relation Age of Onset  . Sickle cell anemia Other   . Heart disease Mother   . Heart disease Father   . Cancer Daughter 64    breast ca  . Hypertension Daughter   . Cancer Daughter 81    breast ca  . Hypertension Daughter   . Heart disease Son     Poor circulation-Left  Leg  . Diabetes Daughter   . Hypertension Daughter   . Hyperlipidemia Daughter     Poor circulation- Toe amputation    Social History:  reports that she has never smoked. Her smokeless tobacco use includes Snuff. She reports that she does not drink alcohol or use illicit drugs.  Additional Social History:  Alcohol / Drug Use History of alcohol / drug use?: No history of alcohol / drug abuse  CIWA: CIWA-Ar BP: 149/83 mmHg Pulse Rate: 93 COWS:    PATIENT STRENGTHS: (choose at least two) Ability for insight Average or above average intelligence Communication skills General fund of knowledge Supportive family/friends  Allergies:  Allergies  Allergen Reactions  . Peanuts [Nuts] Swelling  . Lisinopril Cough    Home Medications:  (Not in a hospital  admission)  OB/GYN Status:  No LMP recorded. Patient is postmenopausal.  General Assessment Data Location of Assessment: [facility] TTS Assessment: In system Is this a Tele or Face-to-Face Assessment?: Face-to-Face Is this an Initial Assessment or a Re-assessment for this encounter?: Initial Assessment Is patient pregnant?: No Pregnancy Status: No Living Arrangements: Children Can pt return to current living arrangement?: Yes Admission Status: Involuntary Is patient capable of signing voluntary admission?: Yes Referral Source: Self/Family/Friend Insurance type: Medicare      Crisis Care Plan Living Arrangements: Children Name of Psychiatrist: No provider reported.  Name of Therapist: No provider reported   Education Status Is patient currently in school?: No Current Grade: N/A Highest grade of school patient has completed: N/A Name of school: N/A Contact person: N/A  Risk to self with the past 6 months Suicidal Ideation: No-Not Currently/Within Last 6 Months Has patient been a risk to self within the past 6 months prior to admission? : No Suicidal Intent: No-Not Currently/Within Last 6 Months Has patient had any suicidal intent within the past 6 months prior to admission? : No Is patient at risk for suicide?: No Suicidal Plan?: No-Not Currently/Within Last 6 Months Has patient had any suicidal plan within the past 6 months prior to admission? : No Access to Means: No What has been your use of drugs/alcohol within the last 12 months?: Pt denies drug and alcohol use.  Previous Attempts/Gestures: No How many times?: 0 Other Self Harm Risks: Pt denies Triggers for Past Attempts: None known Intentional Self Injurious Behavior: None Family Suicide History: No Recent stressful life event(s): Other (Comment) (Moving out of her home.) Persecutory voices/beliefs?: No Depression: No Depression Symptoms: Loss of interest in usual pleasures Substance abuse history and/or treatment  for substance abuse?: No  Risk to Others within the past 6 months Homicidal Ideation: No Does patient have any lifetime risk of violence toward others beyond the six months prior to admission? : No Thoughts of Harm to Others: No Current Homicidal Intent: No Current Homicidal Plan: No Access to Homicidal Means: No Identified Victim: N/A History of harm to others?: No Assessment of Violence: None Noted Violent Behavior Description: No violent behaviors observed. Pt is calm and cooperative at this time.  Does patient have access to weapons?: No Criminal Charges Pending?: No Does patient have a court date: No Is patient on probation?: No  Psychosis Hallucinations: None noted Delusions: None noted  Mental Status Report Appearance/Hygiene: Unremarkable Eye Contact: Good Motor Activity: Unremarkable Speech: Logical/coherent Level of Consciousness: Alert Mood: Pleasant Affect: Appropriate to circumstance Anxiety Level: None Thought Processes: Coherent, Relevant Judgement: Unimpaired Orientation: Person, Place, Time, Situation, Appropriate for developmental age Obsessive Compulsive Thoughts/Behaviors: None  Cognitive Functioning  Concentration: Normal Memory: Recent Intact, Remote Intact IQ: Average Insight: Fair Impulse Control: Good Appetite: Good Weight Loss: 0 Weight Gain: 0 Sleep: No Change Total Hours of Sleep: 8 Vegetative Symptoms: None  ADLScreening [facility] Assessment Services) Patient's cognitive ability adequate to safely complete daily activities?: Yes Patient able to express need for assistance with ADLs?: Yes Independently performs ADLs?: Yes (appropriate for developmental age)  Prior Inpatient Therapy Prior Inpatient Therapy: No  Prior Outpatient Therapy Prior Outpatient Therapy: No Does patient have an ACCT team?: No Does patient have Intensive In-House Services?  : No Does patient have [facility] services? : No Does patient have P4CC services?: No  ADL  Screening (condition at time of admission) Patient's cognitive ability adequate to safely complete daily activities?: Yes Is the patient deaf or have difficulty hearing?: No Does the patient have difficulty seeing, even when wearing glasses/contacts?: No Does the patient have difficulty concentrating, remembering, or making decisions?: No Patient able to express need for assistance with ADLs?: Yes Does the patient have difficulty dressing or bathing?: No Independently performs ADLs?: Yes (appropriate for developmental age)       Abuse/Neglect Assessment (Assessment to be complete while patient is alone) Physical Abuse: Denies Verbal Abuse: Denies Sexual Abuse: Denies Exploitation of patient/patient's resources: Denies Self-Neglect: Denies     Regulatory affairs officer (For Healthcare) Does patient have an advance directive?: No    Additional Information 1:1 In Past 12 Months?: No CIRT Risk: No Elopement Risk: No Does patient have medical clearance?: Yes     Disposition:  Disposition Initial Assessment Completed for this Encounter: Yes  Somer Trotter S 01/19/2016 8:46 PM

## 2016-01-19 NOTE — ED Notes (Signed)
Pt is adamant that she was mad and did not mean what she said.  Denies SI/HI/AV.  Sts "I felt like hurting myself several years ago when my son died, but not now."    Pt's daughter reports "she is going through a transition.  She can't take care of herself anymore and we have no option but to move her in with my sister/her daughter.  She's not happy about it."

## 2016-01-19 NOTE — ED Notes (Signed)
Pt presents w/ [facility] after being IVC'd by MD at [facility].  Per IVC paperwork, "Pt reporting suicidal ideation with 3 detailed plans."

## 2016-01-19 NOTE — Assessment & Plan Note (Signed)
Patient reporting current suicidal thoughts with three detailed plans and access to guns and a large butcher knife. Lives alone, so would not be supervised if she went home today. Refusing hospital admission. As patient is actively suicidal, will involuntarily commit to [facility] health unit for further management.

## 2016-01-19 NOTE — Progress Notes (Signed)
Patient noted to be IVC'd from pcp office for SI.  EDCM spoke to patient and her daughter [name] at bedside.  Patient reports she lives with her daughter May.  [name] reports patient's daughter May has not been well with CHF, so Mat went to  Live with her son, leaving the patient alone in the home.  Patient has recently moved into her other daughter's house [place].  Patient reports "I am never left alone, [name] doesn't work."  Patient's daughter [name] reports that patient is going through a change and she is upset.  Patient confirms she is receiving home health PT with [place].  [name] reports patient has an aide from [employer] who comes every day for 2 hours and assists patient with meals, medications, ADL's and light house work.  EDCM discussed [facility] services.  Patient agreeable to [facility] consult.   Patient asking about ALF process.  [facility] consulted EDSW to speak to patient and her daughter regarding ALF process.  No further EDCM needs at this time.

## 2016-01-19 NOTE — ED Provider Notes (Signed)
CSN: [id number]     Arrival date & time 01/19/16  1733 History   First MD Initiated Contact with Patient 01/19/16 1839     Chief Complaint  Patient presents with  . IVC   . Suicidal     The history is provided by the patient and a relative. No language interpreter was used.   [name] is a 82 y.o. female who presents to the Emergency Department complaining of SI/IVC. Patient denies any SI in the emergency department. She states she became upset and said something she did not mean. She presents with paperwork from family medicine office that reports the patient had suicidal ideation with 3 detailed plans. Patient states that she said earlier that she was going to have the police officer sit down next to her and she was going to steal her his gun and shoot herself. She states in the past when she lost her son she consider laying down in the street to be hit by a car. She also reports another plan that she cannot recall currently.  Past Medical History  Diagnosis Date  . Breast cancer ([place]) 10/2010    Invasive Ductal Carcinoma, s/p bilateral mastectomy, now on Tamoxifen. Followed by Dr Jamse Arn.   . Diabetes mellitus   . Hypertension   . Hyperlipidemia   . Anemia     Iron def anemia  . CAD (coronary artery disease)   . CHF (congestive heart failure) (HCC)     Systolic   . History of colon cancer   . Neuropathy ([place])   . Allergy   . GERD (gastroesophageal reflux disease)   . Hiatal hernia   . Diverticulosis   . Asthma   . Arthritis   . H/O: GI bleed   . Breast cancer ([place]) 2011    s/p Bilateral masectomy  . Shortness of breath   . Pneumonia 03/2012  . Carotid artery aneurysm ([place]) 08/2010    right ICA, 5 x 15m  . DDD (degenerative disc disease), cervical   . Colon cancer ([place]   . [place] 12/11/2013  . Myocardial infarction ([place]   . Hypercalcemia 06/09/2014   Past Surgical History  Procedure Laterality Date  . Breast masectomy  10/2010    Bilateral masectomy by  Dr NLucia Gaskinss/p invasive ductal carcinoma.  . Cardiac  catherization  2007    Severe 3-vessel disease.  EF 20-25%.  . Esophagogastroduodenoscopy  2001    Esophageal Tear  . Total knee arthroplasty  2001  . Esophagogastroduodenoscopy  10/27/2012    Procedure: ESOPHAGOGASTRODUODENOSCOPY (EGD);  Surgeon: JIrene Shipper MD;  [facility];  Service: Endoscopy;  Laterality: N/A;  pat  . Breast surgery    . Joint replacement Right 2001    Knee   Family History  Problem Relation Age of Onset  . Sickle cell anemia Other   . Heart disease Mother   . Heart disease Father   . Cancer Daughter 350   breast ca  . Hypertension Daughter   . Cancer Daughter 560   breast ca  . Hypertension Daughter   . Heart disease Son     Poor circulation-Left Leg  . Diabetes Daughter   . Hypertension Daughter   . Hyperlipidemia Daughter     Poor circulation- Toe amputation   Social History  Substance Use Topics  . Smoking status: Never Smoker   . Smokeless tobacco: Current User    Types: Snuff  . Alcohol Use: No  OB History    No data available     Review of Systems  All other systems reviewed and are negative.     Allergies  Peanuts and Lisinopril  Home Medications   Prior to Admission medications   Medication Sig Start Date End Date Taking? Authorizing Provider  acetaminophen (TYLENOL) 500 MG tablet Take 500 mg by mouth every 6 (six) hours as needed for moderate pain (pain).   Yes Historical Provider, MD  albuterol (PROVENTIL HFA;VENTOLIN HFA) 108 (90 BASE) MCG/ACT inhaler Inhale 2 puffs into the lungs every 6 (six) hours as needed. For shortness of breath 01/18/15  Yes [name], MD  amLODipine (NORVASC) 5 MG tablet Take 1 tablet (5 mg total) by mouth daily. 12/30/15  Yes [name], MD  aspirin (ASPIRIN CHILDRENS) 81 MG chewable tablet Chew 81 mg by mouth every other day.    Yes Historical Provider, MD  Blood Pressure KIT Use the blood pressure kit to check you blood  pressure twice a day 08/27/15  Yes [name], MD  carvedilol (COREG) 12.5 MG tablet Take 1 tablet (12.5 mg total) by mouth 2 (two) times daily with a meal. 12/30/15  Yes [name], MD  clopidogrel (PLAVIX) 75 MG tablet TAKE 1 TABLET BY MOUTH EVERY DAY 12/30/15  Yes [name], MD  diclofenac sodium (VOLTAREN) 1 % GEL APPLY 2 grams topically 4 TIMES DAILY AS NEEDED FOR PAIN 09/23/15  Yes [name], MD  gabapentin (NEURONTIN) 300 MG capsule Take 1 capsule (300 mg total) by mouth 2 (two) times daily. 12/30/15  Yes [name], MD  glucose blood test strip Use as instructed Dx. Code 250.02 09/28/14  Yes Crystal Libby Maw, MD  loratadine (CLARITIN) 10 MG tablet TAKE 1/2 TABLET BY MOUTH DAILY Patient taking differently: TAKE 1/2 TABLET BY MOUTH DAILY as needed for allergies 12/08/15  Yes [name], MD  losartan (COZAAR) 50 MG tablet TAKE 1 TABLET (50 MG TOTAL) BY MOUTH AT BEDTIME. Patient taking differently: Take 50 mg by mouth daily. . 12/30/15  Yes [name], MD  magnesium hydroxide (MILK OF MAGNESIA) 400 MG/5ML suspension Take 30 mLs by mouth daily as needed for mild constipation.   Yes Historical Provider, MD  PATADAY 0.2 % SOLN APPLY 1 DROP TO EYE DAILY. Patient taking differently: APPLY 1 DROP TO BOTH EYES DAILY as needed for irritation 11/23/15  Yes Crystal Libby Maw, MD  ranitidine (ZANTAC) 150 MG capsule TAKE 1 CAPSULE BY MOUTH 2 TIMES DAILY AS NEEDED FOR heartburn 09/23/15  Yes [name], MD  rosuvastatin (CRESTOR) 20 MG tablet TAKE 1 TABLET BY MOUTH AT BEDTIME 01/10/16  Yes [name], MD  traMADol (ULTRAM) 50 MG tablet Take 1 tablet (50 mg total) by mouth every 8 (eight) hours as needed for severe pain. 01/19/16  Yes Verner Mould, MD  nicotine (NICODERM CQ - DOSED IN MG/24 HR) 7 mg/24hr patch Place 1 patch (7 mg total) onto the skin daily as needed (nicotine withdrawal). Patient not taking: Reported on 01/19/2016 11/26/15   Ashly M  Gottschalk, DO   BP 149/83 mmHg  Pulse 93  Temp(Src) 97.8 F (36.6 C) (Oral)  Resp 16  SpO2 99% Physical Exam  Constitutional: She is oriented to person, place, and time. She appears well-developed and well-nourished.  HENT:  Head: Normocephalic and atraumatic.  Cardiovascular: Normal rate and regular rhythm.   No murmur heard. Pulmonary/Chest: Effort normal and breath sounds normal.  No respiratory distress.  Abdominal: Soft. There is no tenderness. There is no rebound and no guarding.  Musculoskeletal: She exhibits no edema or tenderness.  Neurological: She is alert and oriented to person, place, and time.  Skin: Skin is warm and dry.  Psychiatric: She has a normal mood and affect. Her behavior is normal.  Nursing note and vitals reviewed.   ED Course  Procedures (including critical care time) Labs Review Labs Reviewed  COMPREHENSIVE METABOLIC PANEL - Abnormal; Notable for the following:    Glucose, Bld 267 (*)    Calcium 10.9 (*)    ALT 10 (*)    GFR calc non Af Amer 49 (*)    GFR calc Af Amer 56 (*)    All other components within normal limits  CBC WITH DIFFERENTIAL/PLATELET - Abnormal; Notable for the following:    Hemoglobin 11.2 (*)    HCT 35.6 (*)    MCV 71.9 (*)    MCH 22.6 (*)    RDW 16.0 (*)    All other components within normal limits  ETHANOL  URINE RAPID DRUG SCREEN, HOSP PERFORMED  URINALYSIS, ROUTINE W REFLEX MICROSCOPIC (NOT AT [facility])    Imaging Review No results found. I have personally reviewed and evaluated these images and lab results as part of my medical decision-making.   EKG Interpretation None      MDM   Final diagnoses:  None    Patient here following IVC for suicidal ideation. Patient is not actively suicidal at department and is calm and appropriate. She does report that she expressed suicidal thoughts and she was upset earlier over having to move in with family and not being able to live by herself anymore. She has been  medically cleared for psychiatric evaluation.     Quintella Reichert, MD 01/19/16 [phone]

## 2016-01-20 DIAGNOSIS — F329 Major depressive disorder, single episode, unspecified: Secondary | ICD-10-CM | POA: Diagnosis not present

## 2016-01-20 DIAGNOSIS — F32A Depression, unspecified: Secondary | ICD-10-CM | POA: Diagnosis present

## 2016-01-20 LAB — RAPID URINE DRUG SCREEN, HOSP PERFORMED
AMPHETAMINES: NOT DETECTED
BARBITURATES: NOT DETECTED
BENZODIAZEPINES: NOT DETECTED
Cocaine: NOT DETECTED
Opiates: NOT DETECTED
Tetrahydrocannabinol: NOT DETECTED

## 2016-01-20 LAB — URINE MICROSCOPIC-ADD ON

## 2016-01-20 LAB — URINALYSIS, ROUTINE W REFLEX MICROSCOPIC
BILIRUBIN URINE: NEGATIVE
Glucose, UA: NEGATIVE mg/dL
Ketones, ur: NEGATIVE mg/dL
NITRITE: NEGATIVE
PROTEIN: 30 mg/dL — AB
Specific Gravity, Urine: 1.02 (ref 1.005–1.030)
pH: 5.5 (ref 5.0–8.0)

## 2016-01-20 NOTE — ED Notes (Signed)
Pt has in belonging bag:  Black shoes, red hat, black jacket, red sweater, white under shirt, black pants.

## 2016-01-20 NOTE — Discharge Instructions (Signed)
Stress and Stress Management °Stress is a normal reaction to life events. It is what you feel when life demands more than you are used to or more than you can handle. Some stress can be useful. For example, the stress reaction can help you catch the last bus of the day, study for a test, or meet a deadline at work. But stress that occurs too often or for too long can cause problems. It can affect your emotional health and interfere with relationships and normal daily activities. Too much stress can weaken your immune system and increase your risk for physical illness. If you already have a medical problem, stress can make it worse. °CAUSES  °All sorts of life events may cause stress. An event that causes stress for one person may not be stressful for another person. Major life events commonly cause stress. These may be positive or negative. Examples include losing your job, moving into a new home, getting married, having a baby, or losing a loved one. Less obvious life events may also cause stress, especially if they occur day after day or in combination. Examples include working long hours, driving in traffic, caring for children, being in debt, or being in a difficult relationship. °SIGNS AND SYMPTOMS °Stress may cause emotional symptoms including, the following: °· Anxiety. This is feeling worried, afraid, on edge, overwhelmed, or out of control. °· Anger. This is feeling irritated or impatient. °· Depression. This is feeling sad, down, helpless, or guilty. °· Difficulty focusing, remembering, or making decisions. °Stress may cause physical symptoms, including the following:  °· Aches and pains. These may affect your head, neck, back, stomach, or other areas of your body. °· Tight muscles or clenched jaw. °· Low energy or trouble sleeping.  °Stress may cause unhealthy behaviors, including the following:  °· Eating to feel better (overeating) or skipping meals. °· Sleeping too little, too much, or both. °· Working  too much or putting off tasks (procrastination). °· Smoking, drinking alcohol, or using drugs to feel better. °DIAGNOSIS  °Stress is diagnosed through an assessment by your health care provider. Your health care provider will ask questions about your symptoms and any stressful life events. Your health care provider will also ask about your medical history and may order blood tests or other tests. Certain medical conditions and medicine can cause physical symptoms similar to stress.  Mental illness can cause emotional symptoms and unhealthy behaviors similar to stress. Your health care provider may refer you to a mental health professional for further evaluation.  °TREATMENT  °Stress management is the recommended treatment for stress. The goals of stress management are reducing stressful life events and coping with stress in healthy ways.  °Techniques for reducing stressful life events include the following: °· Stress identification. Self-monitor for stress and identify what causes stress for you. These skills may help you to avoid some stressful events. °· Time management. Set your priorities, keep a calendar of events, and learn to say "no." These tools can help you avoid making too many commitments. °Techniques for coping with stress include the following: °· Rethinking the problem. Try to think realistically about stressful events rather than ignoring them or overreacting. Try to find the positives in a stressful situation rather than focusing on the negatives. °· Exercise. Physical exercise can release both physical and emotional tension. The key is to find a form of exercise you enjoy and do it regularly. °· Relaxation techniques. These relax the body and mind. Examples include yoga, meditation, tai chi, biofeedback, deep   breathing, progressive muscle relaxation, listening to music, being out in nature, journaling, and other hobbies. Again, the key is to find one or more that you enjoy and can do  regularly.  Healthy lifestyle. Eat a balanced diet, get plenty of sleep, and do not smoke. Avoid using alcohol or drugs to relax.  Strong support network. Spend time with family, friends, or other people you enjoy being around.Express your feelings and talk things over with someone you trust. Counseling or talktherapy with a mental health professional may be helpful if you are having difficulty managing stress on your own. Medicine is typically not recommended for the treatment of stress.Talk to your health care provider if you think you need medicine for symptoms of stress. HOME CARE INSTRUCTIONS  Keep all follow-up visits as directed by your health care provider.  Take all medicines as directed by your health care provider. SEEK MEDICAL CARE IF:  Your symptoms get worse or you start having new symptoms.  You feel overwhelmed by your problems and can no longer manage them on your own. SEEK IMMEDIATE MEDICAL CARE IF:  You feel like hurting yourself or someone else.   This information is not intended to replace advice given to you by your health care provider. Make sure you discuss any questions you have with your health care provider.   Document Released: 04/25/2001 Document Revised: 11/20/2014 Document Reviewed: 06/24/2013 Elsevier Interactive Patient Education [employer].

## 2016-01-20 NOTE — Consult Note (Signed)
[facility] Consult   Reason for Consult:  Depression, anger, Suicide ideation. Referring Physician:  EDP Patient Identification: [name] MRN:  [id number] Principal Diagnosis: Depression Diagnosis:   Patient Active Problem List   Diagnosis Date Noted  . Depression [F32.9] 01/20/2016    Priority: Medium  . Suicidal ideation [R45.851] 01/19/2016  . Prolapse of female pelvic organs [N81.9] 12/28/2015  . Leg weakness, bilateral [R29.898] 12/08/2015  . Weakness of right lower extremity [R29.898] 11/25/2015  . (HFpEF) heart failure with preserved ejection fraction ([place]) [I50.30]   . CAD in native artery [I25.10]   . Emesis, persistent [R11.10]   . Gout [M10.9] 05/14/2015  . Acute delirium [R41.0] 04/15/2015  . Hypertension [I10] 04/07/2015  . Dyslipidemia [E78.5]   . Essential hypertension [I10]   . Seasonal allergies [J30.2] 02/10/2015  . Stroke-like symptoms [R29.90]   . Stroke-like episode ([place]) [I63.9] 01/25/2015  . Syncope and collapse [R55] 01/25/2015  . Accelerated hypertension [I10]   . Hypertensive emergency [I16.1] 09/19/2014  . Anemia of chronic illness [D63.8] 08/19/2014  . Acute superficial venous thrombosis of right lower extremity [I82.811] 08/19/2014  . Hypercalcemia [E83.52] 06/09/2014  . Breast cancer, left breast ([place]) [C50.912] 06/09/2014  . Breast cancer, right breast ([place]) [C50.911] 06/09/2014  . Edema of left lower extremity [R60.0] 03/30/2014  . PAD (peripheral artery disease) ([place]) [I73.9] 03/30/2014  . History of colon cancer [Z85.038] 12/11/2013  . History of fall [Z91.81] 05/15/2013  . Memory deficit [R41.3] 05/15/2013  . Bilateral leg pain [M79.604, M79.605] 03/07/2013  . Trigger thumb of right hand [M65.311] 03/07/2013  . Lower extremity edema [R60.0] 02/19/2013  . Stricture and stenosis of esophagus [K22.2] 10/27/2012  . Poor social situation [Z60.9] 09/13/2012  . Musculoskeletal chest pain [R07.89] 07/10/2012  . Thoracic  back pain [M54.6] 05/24/2012  . Weight loss [R63.4] 04/03/2012  . Insomnia [G47.00] 09/25/2011  . Tobacco abuse [Z72.0] 06/02/2011  . History of breast cancer in female, bilateral.  Mastectomies 10/24/2010. [Z85.3] 04/26/2011  . Hiatal hernia [K44.9] 03/22/2011  . Colon polyp [K63.5] 03/22/2011  . Poor circulation [I99.9] 03/22/2011  . Vertebrobasilar artery syndrome [G45.0] 08/22/2010  . NEUROPATHY [G58.9] 08/25/2009  . ANEMIA-IRON DEFICIENCY [D50.9] 04/29/2007  . HLD (hyperlipidemia) [E78.5] 01/29/2007  . Diabetes type 2, controlled ([place]) [E11.9] 01/10/2007  . HYPERTENSION, BENIGN SYSTEMIC [I10] 01/10/2007  . Coronary atherosclerosis [I25.10] 01/10/2007  . Chronic systolic heart failure ([place]) [I50.22] 01/10/2007  . Asthma [J45.909] 01/10/2007  . Reflux esophagitis [K21.0] 01/10/2007  . Osteoarthritis, multiple sites [M15.9] 01/10/2007    Total Time spent with patient: 45 minutes  Subjective:   [name] is a 82 y.o. female patient admitted with  Depression, Anger, Suicide ideation,   HPI:  AA female, 82 years old was brought in yesterday under IVC taken out by her daughter for voicing suicide by Gun shot.  Patient today admitted that she had told her daughter that she was going to invite [employer] to the house and grab their gun and shot her self.  She states she regrets saying that and that she was frustrated that she can no longer walk and do things she used to do in the past.  Patient denies previous hx of MH illness and denies taking Psychotropic medications.  Patient has multiple medical conditions including Cancer x 2 in remission.   Patient today reports that her mood is good and her affect is congruent.  She denies SI/HI/AVH.  She reports good sleep and appetite stating she eats twice a  day.  Patient is discharged home.  Past Psychiatric History: Denies  Risk to Self: Suicidal Ideation: No-Not Currently/Within Last 6 Months Suicidal Intent: No-Not Currently/Within Last 6  Months Is patient at risk for suicide?: No Suicidal Plan?: No-Not Currently/Within Last 6 Months Access to Means: No What has been your use of drugs/alcohol within the last 12 months?: Pt denies drug and alcohol use.  How many times?: 0 Other Self Harm Risks: Pt denies Triggers for Past Attempts: None known Intentional Self Injurious Behavior: None Risk to Others: Homicidal Ideation: No Thoughts of Harm to Others: No Current Homicidal Intent: No Current Homicidal Plan: No Access to Homicidal Means: No Identified Victim: N/A History of harm to others?: No Assessment of Violence: None Noted Violent Behavior Description: No violent behaviors observed. Pt is calm and cooperative at this time.  Does patient have access to weapons?: No Criminal Charges Pending?: No Does patient have a court date: No Prior Inpatient Therapy: Prior Inpatient Therapy: No Prior Outpatient Therapy: Prior Outpatient Therapy: No Does patient have an ACCT team?: No Does patient have Intensive In-House Services?  : No Does patient have [facility] services? : No Does patient have P4CC services?: No  Past Medical History:  Past Medical History  Diagnosis Date  . Breast cancer ([place]) 10/2010    Invasive Ductal Carcinoma, s/p bilateral mastectomy, now on Tamoxifen. Followed by Dr Jamse Arn.   . Diabetes mellitus   . Hypertension   . Hyperlipidemia   . Anemia     Iron def anemia  . CAD (coronary artery disease)   . CHF (congestive heart failure) ([facility])     Systolic   . History of colon cancer   . Neuropathy ([place])   . Allergy   . GERD (gastroesophageal reflux disease)   . Hiatal hernia   . Diverticulosis   . Asthma   . Arthritis   . H/O: GI bleed   . Breast cancer ([place]) 2011    s/p Bilateral masectomy  . Shortness of breath   . Pneumonia 03/2012  . Carotid artery aneurysm ([place]) 08/2010    right ICA, 5 x 2m  . DDD (degenerative disc disease), cervical   . [place]   . Colon cancer ([place]  12/11/2013  . Myocardial infarction ([place]   . Hypercalcemia 06/09/2014    Past Surgical History  Procedure Laterality Date  . Breast masectomy  10/2010    Bilateral masectomy by Dr NLucia Gaskinss/p invasive ductal carcinoma.  . Cardiac  catherization  2007    Severe 3-vessel disease.  EF 20-25%.  . Esophagogastroduodenoscopy  2001    Esophageal Tear  . Total knee arthroplasty  2001  . Esophagogastroduodenoscopy  10/27/2012    Procedure: ESOPHAGOGASTRODUODENOSCOPY (EGD);  Surgeon: JIrene Shipper MD;  [facility]  Service: Endoscopy;  Laterality: N/A;  pat  . Breast surgery    . Joint replacement Right 2001    Knee   Family History:  Family History  Problem Relation Age of Onset  . Sickle cell anemia Other   . Heart disease Mother   . Heart disease Father   . Cancer Daughter 337   breast ca  . Hypertension Daughter   . Cancer Daughter 562   breast ca  . Hypertension Daughter   . Heart disease Son     Poor circulation-Left Leg  . Diabetes Daughter   . Hypertension Daughter   . Hyperlipidemia Daughter     Poor circulation- Toe amputation   Family  Psychiatric  History:  Denies Social History:  History  Alcohol Use No     History  Drug Use No    Social History   Social History  . Marital Status: Widowed    Spouse Name: N/A  . Number of Children: N/A  . Years of Education: N/A   Social History Main Topics  . Smoking status: Never Smoker   . Smokeless tobacco: Current User    Types: Snuff  . Alcohol Use: No  . Drug Use: No  . Sexual Activity: No   Other Topics Concern  . None   Social History Narrative   Widowed.  She lives with her son and has 2 daughters who help with her care.  She has a large extended family in the area who are very involved.    Additional Social History:    Allergies:   Allergies  Allergen Reactions  . Peanuts [Nuts] Swelling  . Lisinopril Cough    Labs:  Results for orders placed or performed during the hospital  encounter of 01/19/16 (from the past 48 hour(s))  Comprehensive metabolic panel     Status: Abnormal   Collection Time: 01/19/16  8:53 PM  Result Value Ref Range   Sodium 140 135 - 145 mmol/L   Potassium 3.9 3.5 - 5.1 mmol/L   Chloride 106 101 - 111 mmol/L   CO2 24 22 - 32 mmol/L   Glucose, Bld 267 (H) 65 - 99 mg/dL   BUN 16 6 - 20 mg/dL   Creatinine, Ser 0.99 0.44 - 1.00 mg/dL   Calcium 10.9 (H) 8.9 - 10.3 mg/dL   Total Protein 8.1 6.5 - 8.1 g/dL   Albumin 3.8 3.5 - 5.0 g/dL   AST 17 15 - 41 U/L   ALT 10 (L) 14 - 54 U/L   Alkaline Phosphatase 52 38 - 126 U/L   Total Bilirubin 0.3 0.3 - 1.2 mg/dL   GFR calc non Af Amer 49 (L) >60 mL/min   GFR calc Af Amer 56 (L) >60 mL/min    Comment: (NOTE) The eGFR has been calculated using the CKD EPI equation. This calculation has not been validated in all clinical situations. eGFR's persistently <60 mL/min signify possible Chronic Kidney Disease.    Anion gap 10 5 - 15  CBC with Differential     Status: Abnormal   Collection Time: 01/19/16  8:53 PM  Result Value Ref Range   WBC 6.4 4.0 - 10.5 K/uL   RBC 4.95 3.87 - 5.11 MIL/uL   Hemoglobin 11.2 (L) 12.0 - 15.0 g/dL   HCT 35.6 (L) 36.0 - 46.0 %   MCV 71.9 (L) 78.0 - 100.0 fL   MCH 22.6 (L) 26.0 - 34.0 pg   MCHC 31.5 30.0 - 36.0 g/dL   RDW 16.0 (H) 11.5 - 15.5 %   Platelets 180 150 - 400 K/uL    Comment: SPECIMEN CHECKED FOR CLOTS REPEATED TO VERIFY    Neutrophils Relative % 53 %   Neutro Abs 3.4 1.7 - 7.7 K/uL   Lymphocytes Relative 38 %   Lymphs Abs 2.4 0.7 - 4.0 K/uL   Monocytes Relative 5 %   Monocytes Absolute 0.3 0.1 - 1.0 K/uL   Eosinophils Relative 3 %   Eosinophils Absolute 0.2 0.0 - 0.7 K/uL   Basophils Relative 0 %   Basophils Absolute 0.0 0.0 - 0.1 K/uL  Ethanol     Status: None   Collection Time: 01/19/16  8:53 PM  Result Value  Ref Range   Alcohol, Ethyl (B) <5 <5 mg/dL    Comment:        LOWEST DETECTABLE LIMIT FOR SERUM ALCOHOL IS 5 mg/dL FOR MEDICAL  PURPOSES ONLY   Urine rapid drug screen (hosp performed)     Status: None   Collection Time: 01/20/16  6:14 AM  Result Value Ref Range   Opiates NONE DETECTED NONE DETECTED   Cocaine NONE DETECTED NONE DETECTED   Benzodiazepines NONE DETECTED NONE DETECTED   Amphetamines NONE DETECTED NONE DETECTED   Tetrahydrocannabinol NONE DETECTED NONE DETECTED   Barbiturates NONE DETECTED NONE DETECTED    Comment:        DRUG SCREEN FOR MEDICAL PURPOSES ONLY.  IF CONFIRMATION IS NEEDED FOR ANY PURPOSE, NOTIFY LAB WITHIN 5 DAYS.        LOWEST DETECTABLE LIMITS FOR URINE DRUG SCREEN Drug Class       Cutoff (ng/mL) Amphetamine      1000 Barbiturate      200 Benzodiazepine   194 Tricyclics       174 Opiates          300 Cocaine          300 THC              50   Urinalysis, Routine w reflex microscopic     Status: Abnormal   Collection Time: 01/20/16  6:14 AM  Result Value Ref Range   Color, Urine YELLOW YELLOW   APPearance CLOUDY (A) CLEAR   Specific Gravity, Urine 1.020 1.005 - 1.030   pH 5.5 5.0 - 8.0   Glucose, UA NEGATIVE NEGATIVE mg/dL   Hgb urine dipstick TRACE (A) NEGATIVE   Bilirubin Urine NEGATIVE NEGATIVE   Ketones, ur NEGATIVE NEGATIVE mg/dL   Protein, ur 30 (A) NEGATIVE mg/dL   Nitrite NEGATIVE NEGATIVE   Leukocytes, UA LARGE (A) NEGATIVE  Urine microscopic-add on     Status: Abnormal   Collection Time: 01/20/16  6:14 AM  Result Value Ref Range   Squamous Epithelial / LPF 0-5 (A) NONE SEEN   WBC, UA TOO NUMEROUS TO COUNT 0 - 5 WBC/hpf   RBC / HPF 0-5 0 - 5 RBC/hpf   Bacteria, UA MANY (A) NONE SEEN    Current Facility-Administered Medications  Medication Dose Route Frequency Provider Last Rate Last Dose  . acetaminophen (TYLENOL) tablet 500 mg  500 mg Oral Q6H PRN Quintella Reichert, MD      . amLODipine (NORVASC) tablet 5 mg  5 mg Oral Daily Quintella Reichert, MD   5 mg at 01/20/16 1027  . aspirin chewable tablet 81 mg  81 mg Oral Beverly Milch, MD   81 mg at  01/20/16 1027  . carvedilol (COREG) tablet 12.5 mg  12.5 mg Oral BID WC Quintella Reichert, MD   12.5 mg at 01/20/16 0835  . clopidogrel (PLAVIX) tablet 75 mg  75 mg Oral Daily Quintella Reichert, MD   75 mg at 01/20/16 0835  . gabapentin (NEURONTIN) capsule 300 mg  300 mg Oral BID Quintella Reichert, MD   300 mg at 01/20/16 1027  . losartan (COZAAR) tablet 50 mg  50 mg Oral Daily Quintella Reichert, MD   50 mg at 01/20/16 1027  . magnesium hydroxide (MILK OF MAGNESIA) suspension 30 mL  30 mL Oral Daily PRN Quintella Reichert, MD      . rosuvastatin (CRESTOR) tablet 20 mg  20 mg Oral QHS Quintella Reichert, MD   20 mg at 01/20/16  0004  . traMADol (ULTRAM) tablet 50 mg  50 mg Oral Q8H PRN Quintella Reichert, MD   50 mg at 01/20/16 0004   Current Outpatient Prescriptions  Medication Sig Dispense Refill  . acetaminophen (TYLENOL) 500 MG tablet Take 500 mg by mouth every 6 (six) hours as needed for moderate pain (pain).    Marland Kitchen albuterol (PROVENTIL HFA;VENTOLIN HFA) 108 (90 BASE) MCG/ACT inhaler Inhale 2 puffs into the lungs every 6 (six) hours as needed. For shortness of breath 1 Inhaler 6  . amLODipine (NORVASC) 5 MG tablet Take 1 tablet (5 mg total) by mouth daily. 30 tablet 6  . aspirin (ASPIRIN CHILDRENS) 81 MG chewable tablet Chew 81 mg by mouth every other day.     . Blood Pressure KIT Use the blood pressure kit to check you blood pressure twice a day 1 each 0  . carvedilol (COREG) 12.5 MG tablet Take 1 tablet (12.5 mg total) by mouth 2 (two) times daily with a meal. 60 tablet 6  . clopidogrel (PLAVIX) 75 MG tablet TAKE 1 TABLET BY MOUTH EVERY DAY 30 tablet 6  . diclofenac sodium (VOLTAREN) 1 % GEL APPLY 2 grams topically 4 TIMES DAILY AS NEEDED FOR PAIN 100 g 1  . gabapentin (NEURONTIN) 300 MG capsule Take 1 capsule (300 mg total) by mouth 2 (two) times daily. 60 capsule 6  . glucose blood test strip Use as instructed Dx. Code 250.02 100 each 12  . loratadine (CLARITIN) 10 MG tablet TAKE 1/2 TABLET BY MOUTH DAILY  (Patient taking differently: TAKE 1/2 TABLET BY MOUTH DAILY as needed for allergies) 15 tablet 0  . losartan (COZAAR) 50 MG tablet TAKE 1 TABLET (50 MG TOTAL) BY MOUTH AT BEDTIME. (Patient taking differently: Take 50 mg by mouth daily. Marland Kitchen) 30 tablet 6  . magnesium hydroxide (MILK OF MAGNESIA) 400 MG/5ML suspension Take 30 mLs by mouth daily as needed for mild constipation.    Marland Kitchen PATADAY 0.2 % SOLN APPLY 1 DROP TO EYE DAILY. (Patient taking differently: APPLY 1 DROP TO BOTH EYES DAILY as needed for irritation) 2.5 mL 0  . ranitidine (ZANTAC) 150 MG capsule TAKE 1 CAPSULE BY MOUTH 2 TIMES DAILY AS NEEDED FOR heartburn 60 capsule 2  . rosuvastatin (CRESTOR) 20 MG tablet TAKE 1 TABLET BY MOUTH AT BEDTIME 30 tablet 1  . traMADol (ULTRAM) 50 MG tablet Take 1 tablet (50 mg total) by mouth every 8 (eight) hours as needed for severe pain. 75 tablet 0  . nicotine (NICODERM CQ - DOSED IN MG/24 HR) 7 mg/24hr patch Place 1 patch (7 mg total) onto the skin daily as needed (nicotine withdrawal). (Patient not taking: Reported on 01/19/2016) 28 patch 0  . [DISCONTINUED] cetirizine (ZYRTEC) 5 MG tablet Take 1 tablet (5 mg total) by mouth daily. Take at night time as it can cause some drowsiness 30 tablet 4    Musculoskeletal: Strength & Muscle Tone: seen sitting in bed, reports her mobility is getting weak Gait & Station: seen sitting in bed Patient leans: see above  Psychiatric Specialty Exam: Review of Systems  Unable to perform ROS: other  See documented PMH, Patient is medically stable at this time.  Blood pressure 145/72, pulse 70, temperature 98 F (36.7 C), temperature source Oral, resp. rate 18, SpO2 100 %.There is no weight on file to calculate BMI.  General Appearance: Casual and Fairly Groomed  Eye Contact::  Good  Speech:  Clear and Coherent and Normal Rate  Volume:  Normal  Mood:  Depressed  Affect:  Congruent and Depressed  Thought Process:  Coherent, Goal Directed and Intact  Orientation:  Full  (Time, Place, and Person)  Thought Content:  WDL  Suicidal Thoughts:  No  Homicidal Thoughts:  No  Memory:  Immediate;   Good Recent;   Fair Remote;   Fair  Judgement:  Good  Insight:  Good  Psychomotor Activity:  Normal  Concentration:  Good  Recall:  Poor  Fund of Knowledge:Good  Language: Good  Akathisia:  No  Handed:  Right  AIMS (if indicated):     Assets:  Desire for Improvement  ADL's:  Intact  Cognition: WNL  Sleep:       Disposition:  Discharge home, follow up with your providers.  Delfin Gant, NP   PMHNP-BC 01/20/2016 12:11 PM Patient seen face-to-face for psychiatric evaluation, chart reviewed and case discussed with the physician extender and developed treatment plan. Reviewed the information documented and agree with the treatment plan. Corena Pilgrim, MD

## 2016-01-20 NOTE — ED Notes (Signed)
Pt's belonging will be in locker 26

## 2016-01-20 NOTE — BHH Suicide Risk Assessment (Cosign Needed)
Suicide Risk Assessment  Discharge Assessment   [facility] Discharge Suicide Risk Assessment   Principal Problem: Depression Discharge Diagnoses:  Patient Active Problem List   Diagnosis Date Noted  . Depression [F32.9] 01/20/2016    Priority: Medium  . Suicidal ideation [R45.851] 01/19/2016  . Prolapse of female pelvic organs [N81.9] 12/28/2015  . Leg weakness, bilateral [R29.898] 12/08/2015  . Weakness of right lower extremity [R29.898] 11/25/2015  . (HFpEF) heart failure with preserved ejection fraction ([place]) [I50.30]   . CAD in native artery [I25.10]   . Emesis, persistent [R11.10]   . Gout [M10.9] 05/14/2015  . Acute delirium [R41.0] 04/15/2015  . Hypertension [I10] 04/07/2015  . Dyslipidemia [E78.5]   . Essential hypertension [I10]   . Seasonal allergies [J30.2] 02/10/2015  . Stroke-like symptoms [R29.90]   . Stroke-like episode ([place]) [I63.9] 01/25/2015  . Syncope and collapse [R55] 01/25/2015  . Accelerated hypertension [I10]   . Hypertensive emergency [I16.1] 09/19/2014  . Anemia of chronic illness [D63.8] 08/19/2014  . Acute superficial venous thrombosis of right lower extremity [I82.811] 08/19/2014  . Hypercalcemia [E83.52] 06/09/2014  . Breast cancer, left breast ([place]) [C50.912] 06/09/2014  . Breast cancer, right breast ([place]) [C50.911] 06/09/2014  . Edema of left lower extremity [R60.0] 03/30/2014  . PAD (peripheral artery disease) ([place]) [I73.9] 03/30/2014  . History of colon cancer [Z85.038] 12/11/2013  . History of fall [Z91.81] 05/15/2013  . Memory deficit [R41.3] 05/15/2013  . Bilateral leg pain [M79.604, M79.605] 03/07/2013  . Trigger thumb of right hand [M65.311] 03/07/2013  . Lower extremity edema [R60.0] 02/19/2013  . Stricture and stenosis of esophagus [K22.2] 10/27/2012  . Poor social situation [Z60.9] 09/13/2012  . Musculoskeletal chest pain [R07.89] 07/10/2012  . Thoracic back pain [M54.6] 05/24/2012  . Weight loss [R63.4] 04/03/2012  . Insomnia [G47.00]  09/25/2011  . Tobacco abuse [Z72.0] 06/02/2011  . History of breast cancer in female, bilateral.  Mastectomies 10/24/2010. [Z85.3] 04/26/2011  . Hiatal hernia [K44.9] 03/22/2011  . Colon polyp [K63.5] 03/22/2011  . Poor circulation [I99.9] 03/22/2011  . Vertebrobasilar artery syndrome [G45.0] 08/22/2010  . NEUROPATHY [G58.9] 08/25/2009  . ANEMIA-IRON DEFICIENCY [D50.9] 04/29/2007  . HLD (hyperlipidemia) [E78.5] 01/29/2007  . Diabetes type 2, controlled ([place]) [E11.9] 01/10/2007  . HYPERTENSION, BENIGN SYSTEMIC [I10] 01/10/2007  . Coronary atherosclerosis [I25.10] 01/10/2007  . Chronic systolic heart failure ([place]) [I50.22] 01/10/2007  . Asthma [J45.909] 01/10/2007  . Reflux esophagitis [K21.0] 01/10/2007  . Osteoarthritis, multiple sites [M15.9] 01/10/2007    Total Time spent with patient: 20 minutes  Musculoskeletal: Strength & Muscle Tone: seen sitting in bed Gait & Station: seen sitting in bed Patient leans: see above  Psychiatric Specialty Exam:   Blood pressure 145/72, pulse 70, temperature 98 F (36.7 C), temperature source Oral, resp. rate 18, SpO2 100 %.There is no weight on file to calculate BMI.  General Appearance: Casual and Fairly Groomed  Engineer, water::  Good  Speech:  Clear and Coherent and Normal Rate  Volume:  Normal  Mood:  Depressed  Affect:  Congruent and Depressed  Thought Process:  Coherent, Goal Directed and Intact  Orientation:  Full (Time, Place, and Person)  Thought Content:  WDL  Suicidal Thoughts:  No  Homicidal Thoughts:  No  Memory:  Immediate;   Good Recent;   Fair Remote;   Fair  Judgement:  Good  Insight:  Good  Psychomotor Activity:  Normal  Concentration:  Good  Recall:  Poor  Fund of Knowledge:Good  Language: Good  Akathisia:  No  Handed:  Right  AIMS (if indicated):     Assets:  Desire for Improvement  ADL's:  Intact  Cognition: WNL     Mental Status Per Nursing Assessment::   On Admission:     Demographic Factors:  Age  53 or older, Divorced or widowed and Low socioeconomic status  Loss Factors: Decline in physical health  Historical Factors: NA  Risk Reduction Factors:   Religious beliefs about death and Living with another person, especially a relative  Continued Clinical Symptoms:  Depression:   Insomnia  Cognitive Features That Contribute To Risk:  Polarized thinking    Suicide Risk:  Minimal: No identifiable suicidal ideation.  Patients presenting with no risk factors but with morbid ruminations; may be classified as minimal risk based on the severity of the depressive symptoms    Plan Of Care/Follow-up recommendations:  Activity:  as tolerated Diet:  regular  Delfin Gant, NP   PMHNP-BC 01/20/2016, 12:31 PM

## 2016-01-20 NOTE — BHH Counselor (Signed)
TTS counselor faxed referrals to the following facilities in an effort to obtain inpatient placement:  [facility] geriatric [place], [facility]   Therapeutic Triage   [place]    01/20/16    04:30 AM

## 2016-01-20 NOTE — Progress Notes (Signed)
CSW staffed case with nurse. CSW spoke with patient at bedside with her daughter, May present and the Nurse Tech. The daughter stated her brother and her brother's wife would assist with getting patient into the house.CSW informed Psychiatrist, NP, and the Nurse.  Genice Rouge [id number] ED CSW 01/20/2016 3:32 PM

## 2016-01-20 NOTE — ED Notes (Signed)
Daughter took home purse

## 2016-01-21 ENCOUNTER — Other Ambulatory Visit: Payer: Self-pay

## 2016-01-21 NOTE — Patient Outreach (Signed)
[facility]) Care Management  01/21/2016  ALEXONDRIA SERNA [birth year] [id number]   Subjective: "I am doing pretty good...she comes everyday to take care of me".  Referral received from Emergency room- member presented to the emergency room with statements of harm to self-member evaluated and released. Referral for disease management, question assisted living facility placement process, community resources. History of diabetes. RNCM spoke with member who confirmed two patient identifiers. Member denies any issues or concerns at this time. Denies any intent to harm self at this time.   Member reports she lives with her daughter who is home every day, but is not present at the time of call. Member states a personal care assistant comes every day to help her with her bath. Member reports her daughter manages her medications and her assistant checks her blood sugars. RNCM discussed the care management program. Member request RNCM call back to speak with her daughter, Tamilyn Oberbroeckling, regarding Care Management services.   RNCM's contact information provided.  Plan: update assigned [place] to follow up.  Thea Silversmith, RN, MSN, [place] Coordinator Cell: [phone]  (covering [place] for [facility])

## 2016-01-26 ENCOUNTER — Ambulatory Visit: Payer: Self-pay | Admitting: *Deleted

## 2016-01-27 ENCOUNTER — Other Ambulatory Visit: Payer: Self-pay | Admitting: *Deleted

## 2016-01-27 NOTE — Patient Outreach (Signed)
Call placed to member in attempt to speak with daughter, [name], to determine needs and concerns.  Referral states there is need for possible placement in assisted living facility.  Member answers the phone and state that she is doing "pretty good."  She reports that [place] lives there with her sometimes, but is not there currently.  Member state that her daughter does help her with her healthcare management and medication administration.  She is unable to state when she will be available to speak with, but will have her call this care manager when available.  She state that [place] stay with her son in [place] sometimes, but she does not have a phone number for that residence nor does she have an alternate number for her daughter.   Because the referral consisted of a request for community resources and possible placement, will refer to social worker.  Will make another attempt to contact daughter next week.  [name], BSN, [place] Management  [facility] Care Manager [phone]

## 2016-01-28 ENCOUNTER — Other Ambulatory Visit: Payer: Self-pay | Admitting: *Deleted

## 2016-01-31 MED ORDER — GABAPENTIN 300 MG PO CAPS: 300.0000 mg | ORAL_CAPSULE | Freq: Two times a day (BID) | ORAL | Status: DC

## 2016-02-02 ENCOUNTER — Ambulatory Visit (INDEPENDENT_AMBULATORY_CARE_PROVIDER_SITE_OTHER): Payer: Medicare Other | Admitting: Family Medicine

## 2016-02-02 ENCOUNTER — Encounter: Payer: Self-pay | Admitting: Family Medicine

## 2016-02-02 VITALS — BP 142/52 | HR 76 | Temp 98.1°F | Ht 59.0 in | Wt 114.0 lb

## 2016-02-02 DIAGNOSIS — M79672 Pain in left foot: Secondary | ICD-10-CM

## 2016-02-02 DIAGNOSIS — R112 Nausea with vomiting, unspecified: Secondary | ICD-10-CM | POA: Diagnosis not present

## 2016-02-02 MED ORDER — CAPSAICIN 0.025 % EX CREA: TOPICAL_CREAM | Freq: Two times a day (BID) | CUTANEOUS | Status: DC

## 2016-02-02 NOTE — Patient Instructions (Signed)
Use capsaicin cream up to twice a day Sent this in to your pharmacy  Follow up with Dr. Lorenso Courier in 1 month for your heel  Be well, Dr. Ardelia Mems

## 2016-02-04 ENCOUNTER — Other Ambulatory Visit: Payer: Self-pay | Admitting: *Deleted

## 2016-02-04 NOTE — Patient Outreach (Signed)
Calls placed to member [phone]) and daughter [name] [phone]) in attempt to establish care and schedule initial home visit.  Unable to leave a message at the [facility] number, HIPPA compliant voice message left at daughter's number.  Will await call back.  Will make another attempt next week.  [name], BSN, [place] Management  [facility] Care Manager [phone]

## 2016-02-05 NOTE — Progress Notes (Signed)
Date of Visit: 02/02/2016   HPI:  Patient presents for a same day appointment to discuss pain in left heel. She is accompanied by her daughter [name].  They report that on Monday her left heel was painful and red. They used voltaren cream on it which helped. No fevers or swelling. Since then the foot has been fine. Patient did not feel she needed to be seen today. Has been walking well.  Of note patient recently seen at [facility] and endorsed suicidal ideation. She reports now that she was just joking before, and that she now has no thoughts of harming herself or others.   ROS: See HPI  [place]: history of type 2 diabetes, hyperlipidemia, anemia, hypertension, CAD, asthma, osteoarthritis, breast cancer  PHYSICAL EXAM: BP 142/52 mmHg  Pulse 76  Temp(Src) 98.1 F (36.7 C) (Oral)  Ht 4\' 11"  (1.499 m)  Wt 114 lb (51.71 kg)  BMI 23.01 kg/m2 Gen: NAD, pleasant, cooperative HEENT: normocephalic, atraumatic  Extremities: bilateral heels with fat pad atrophy and dry skin. No erythema or tenderness. No pain with palpation of medial calcaneus on L. Full strength of left foot with dorsiflexion, plantarflexion, inversion, eversion. DP pulses 1+ bilaterally.  ASSESSMENT/PLAN:  1. Heel pain - overall improved, unremarkable exam today except for dry skin and fat pad atrophy. Recommend topical pain medication if it flares up again. Given cardiac history, would favor against using topical NSAID. Instead prescribed capsaicin. Follow up with PCP in 1 month.    FOLLOW UP: Follow up in 1 month for heel pain  [place] J. Ardelia Mems, [place]

## 2016-02-08 ENCOUNTER — Encounter: Payer: Self-pay | Admitting: *Deleted

## 2016-02-08 ENCOUNTER — Other Ambulatory Visit: Payer: Self-pay | Admitting: *Deleted

## 2016-02-08 NOTE — Patient Outreach (Signed)
Call placed to member's daughter [name] per [name] (other daughter) request to follow up on concerns raised during emergency department visit regarding member's safety and the need for increased supervision.  [name] states that the member "threw a fit" because she didn't want to leave her home and instead wanted someone to come live with her.  She state that now the member is with another sister and everything has "worked out" and that the member is "thriving."  [name] denies any further needs or assistance, stating that the member has a personal care aid that is with her 2 hours a day.  She expresses no need for [facility] services at this time. Encouraged to contact with any questions/concerns.  Will notify care management assistant and PCP of case closure.  [name], BSN, [place] Management  [facility] Care Manager [phone]

## 2016-02-24 ENCOUNTER — Other Ambulatory Visit: Payer: Self-pay | Admitting: Family Medicine

## 2016-03-02 ENCOUNTER — Encounter (HOSPITAL_COMMUNITY): Payer: Self-pay | Admitting: Vascular Surgery

## 2016-03-02 ENCOUNTER — Emergency Department (HOSPITAL_COMMUNITY): Payer: Medicare Other

## 2016-03-02 ENCOUNTER — Emergency Department (HOSPITAL_COMMUNITY)
Admission: EM | Admit: 2016-03-02 | Discharge: 2016-03-02 | Disposition: A | Discharge status: Home / Self Care | Payer: Medicare Other | Attending: Emergency Medicine | Admitting: Emergency Medicine

## 2016-03-02 DIAGNOSIS — I10 Essential (primary) hypertension: Secondary | ICD-10-CM | POA: Diagnosis not present

## 2016-03-02 DIAGNOSIS — Z9889 Other specified postprocedural states: Secondary | ICD-10-CM | POA: Insufficient documentation

## 2016-03-02 DIAGNOSIS — E785 Hyperlipidemia, unspecified: Secondary | ICD-10-CM | POA: Diagnosis not present

## 2016-03-02 DIAGNOSIS — I509 Heart failure, unspecified: Secondary | ICD-10-CM | POA: Diagnosis not present

## 2016-03-02 DIAGNOSIS — Z79899 Other long term (current) drug therapy: Secondary | ICD-10-CM | POA: Insufficient documentation

## 2016-03-02 DIAGNOSIS — Z8701 Personal history of pneumonia (recurrent): Secondary | ICD-10-CM | POA: Insufficient documentation

## 2016-03-02 DIAGNOSIS — R079 Chest pain, unspecified: Secondary | ICD-10-CM | POA: Diagnosis not present

## 2016-03-02 DIAGNOSIS — Z7902 Long term (current) use of antithrombotics/antiplatelets: Secondary | ICD-10-CM | POA: Diagnosis not present

## 2016-03-02 DIAGNOSIS — G629 Polyneuropathy, unspecified: Secondary | ICD-10-CM | POA: Diagnosis not present

## 2016-03-02 DIAGNOSIS — R059 Cough, unspecified: Secondary | ICD-10-CM

## 2016-03-02 DIAGNOSIS — R05 Cough: Secondary | ICD-10-CM | POA: Diagnosis not present

## 2016-03-02 DIAGNOSIS — J45909 Unspecified asthma, uncomplicated: Secondary | ICD-10-CM | POA: Insufficient documentation

## 2016-03-02 DIAGNOSIS — M546 Pain in thoracic spine: Secondary | ICD-10-CM

## 2016-03-02 DIAGNOSIS — K219 Gastro-esophageal reflux disease without esophagitis: Secondary | ICD-10-CM | POA: Insufficient documentation

## 2016-03-02 DIAGNOSIS — Z85038 Personal history of other malignant neoplasm of large intestine: Secondary | ICD-10-CM | POA: Diagnosis not present

## 2016-03-02 DIAGNOSIS — I252 Old myocardial infarction: Secondary | ICD-10-CM | POA: Diagnosis not present

## 2016-03-02 DIAGNOSIS — Z853 Personal history of malignant neoplasm of breast: Secondary | ICD-10-CM | POA: Insufficient documentation

## 2016-03-02 DIAGNOSIS — Z862 Personal history of diseases of the blood and blood-forming organs and certain disorders involving the immune mechanism: Secondary | ICD-10-CM | POA: Insufficient documentation

## 2016-03-02 DIAGNOSIS — J069 Acute upper respiratory infection, unspecified: Secondary | ICD-10-CM | POA: Diagnosis not present

## 2016-03-02 DIAGNOSIS — Z7982 Long term (current) use of aspirin: Secondary | ICD-10-CM | POA: Diagnosis not present

## 2016-03-02 DIAGNOSIS — R0781 Pleurodynia: Secondary | ICD-10-CM | POA: Diagnosis not present

## 2016-03-02 DIAGNOSIS — I2510 Atherosclerotic heart disease of native coronary artery without angina pectoris: Secondary | ICD-10-CM | POA: Insufficient documentation

## 2016-03-02 DIAGNOSIS — R0602 Shortness of breath: Secondary | ICD-10-CM | POA: Diagnosis not present

## 2016-03-02 DIAGNOSIS — M1990 Unspecified osteoarthritis, unspecified site: Secondary | ICD-10-CM | POA: Diagnosis not present

## 2016-03-02 DIAGNOSIS — E119 Type 2 diabetes mellitus without complications: Secondary | ICD-10-CM | POA: Diagnosis not present

## 2016-03-02 DIAGNOSIS — Z720 Tobacco use: Secondary | ICD-10-CM | POA: Diagnosis not present

## 2016-03-02 LAB — BASIC METABOLIC PANEL
ANION GAP: 8 (ref 5–15)
BUN: 12 mg/dL (ref 6–20)
CHLORIDE: 108 mmol/L (ref 101–111)
CO2: 23 mmol/L (ref 22–32)
CREATININE: 1.11 mg/dL — AB (ref 0.44–1.00)
Calcium: 10.8 mg/dL — ABNORMAL HIGH (ref 8.9–10.3)
GFR calc non Af Amer: 42 mL/min — ABNORMAL LOW (ref >60)
GFR, EST AFRICAN AMERICAN: 49 mL/min — AB (ref >60)
Glucose, Bld: 283 mg/dL — ABNORMAL HIGH (ref 65–99)
POTASSIUM: 3.8 mmol/L (ref 3.5–5.1)
Sodium: 139 mmol/L (ref 135–145)

## 2016-03-02 LAB — I-STAT TROPONIN, ED: Troponin i, poc: 0.01 ng/mL (ref 0.00–0.08)

## 2016-03-02 LAB — CBC
HEMATOCRIT: 31.7 % — AB (ref 36.0–46.0)
HEMOGLOBIN: 10.2 g/dL — AB (ref 12.0–15.0)
MCH: 22.8 pg — AB (ref 26.0–34.0)
MCHC: 32.2 g/dL (ref 30.0–36.0)
MCV: 70.8 fL — AB (ref 78.0–100.0)
Platelets: 179 10*3/uL (ref 150–400)
RBC: 4.48 MIL/uL (ref 3.87–5.11)
RDW: 16.1 % — ABNORMAL HIGH (ref 11.5–15.5)
WBC: 5 10*3/uL (ref 4.0–10.5)

## 2016-03-02 MED ORDER — TRAMADOL HCL 50 MG PO TABS: 50.0000 mg | ORAL_TABLET | Freq: Three times a day (TID) | ORAL | Status: DC | PRN

## 2016-03-02 MED ORDER — GUAIFENESIN-CODEINE 100-10 MG/5ML PO SOLN: 5.0000 mL | Freq: Three times a day (TID) | ORAL | Status: DC | PRN

## 2016-03-02 NOTE — ED Notes (Addendum)
Pt reports to the ED for eval of right lateral rib cage pain. States it began yesterday but became worse today. Is worse with deep breaths and movements. Pt denies any injury but reports she has been coughing up a lot of phlegm recently. Pt endorses some SOB but lung sounds still present on right side. Pt A&Ox4, resp e/u, and skin warm and dry.

## 2016-03-02 NOTE — ED Provider Notes (Signed)
CSN: [id number]     Arrival date & time 03/02/16  1536 History   First MD Initiated Contact with Patient 03/02/16 1904     Chief Complaint  Patient presents with  . Rib Injury     (Consider location/radiation/quality/duration/timing/severity/associated sxs/prior Treatment) HPI  Past Medical History  Diagnosis Date  . Breast cancer ([place]) 10/2010    Invasive Ductal Carcinoma, s/p bilateral mastectomy, now on Tamoxifen. Followed by Dr Jamse Arn.   . Diabetes mellitus   . Hypertension   . Hyperlipidemia   . Anemia     Iron def anemia  . CAD (coronary artery disease)   . CHF (congestive heart failure) (HCC)     Systolic   . History of colon cancer   . Neuropathy ([place])   . Allergy   . GERD (gastroesophageal reflux disease)   . Hiatal hernia   . Diverticulosis   . Asthma   . Arthritis   . H/O: GI bleed   . Breast cancer ([place]) 2011    s/p Bilateral masectomy  . Shortness of breath   . Pneumonia 03/2012  . Carotid artery aneurysm ([place]) 08/2010    right ICA, 5 x 37m  . DDD (degenerative disc disease), cervical   . [place] cancer ([place]   . Colon cancer ([place] 12/11/2013  . [place]ocardial infarction ([place]   . Hypercalcemia 06/09/2014   Past Surgical History  Procedure Laterality Date  . Breast masectomy  10/2010    Bilateral masectomy by Dr NLucia Gaskinss/p invasive ductal carcinoma.  . Cardiac  catherization  2007    Severe 3-vessel disease.  EF 20-25%.  . Esophagogastroduodenoscopy  2001    Esophageal Tear  . Total knee arthroplasty  2001  . Esophagogastroduodenoscopy  10/27/2012    Procedure: ESOPHAGOGASTRODUODENOSCOPY (EGD);  Surgeon: JIrene Shipper MD;  [facility];  Service: Endoscopy;  Laterality: N/A;  pat  . Breast surgery    . Joint replacement Right 2001    Knee   Family History  Problem Relation Age of Onset  . Sickle cell anemia Other   . Heart disease Mother   . Heart disease Father   . Cancer Daughter 397   breast ca  . Hypertension Daughter   . Cancer  Daughter 517   breast ca  . Hypertension Daughter   . Heart disease Son     Poor circulation-Left Leg  . Diabetes Daughter   . Hypertension Daughter   . Hyperlipidemia Daughter     Poor circulation- Toe amputation   Social History  Substance Use Topics  . Smoking status: Never Smoker   . Smokeless tobacco: Current User    Types: Snuff  . Alcohol Use: No   OB History    No data available     Review of Systems  All other systems reviewed and are negative.     Allergies  Peanuts and Lisinopril  Home Medications   Prior to Admission medications   Medication Sig Start Date End Date Taking? Authorizing Provider  acetaminophen (TYLENOL) 500 MG tablet Take 500 mg by mouth every 6 (six) hours as needed for moderate pain (pain).    Historical Provider, MD  albuterol (PROVENTIL HFA;VENTOLIN HFA) 108 (90 BASE) MCG/ACT inhaler Inhale 2 puffs into the lungs every 6 (six) hours as needed. For shortness of breath 01/18/15   CArchie Patten MD  amLODipine (NORVASC) 5 MG tablet Take 1 tablet (5 mg total) by mouth daily. 12/30/15   CArchie Patten MD  aspirin (ASPIRIN CHILDRENS) 81 MG chewable tablet Chew 81 mg by mouth every other day.     Historical Provider, MD  Blood Pressure KIT Use the blood pressure kit to check you blood pressure twice a day 08/27/15   [name], MD  capsaicin (ZOSTRIX) 0.025 % cream APPLY TO AFFECTED AREA TWICE A DAY 02/28/16   [name], MD  carvedilol (COREG) 12.5 MG tablet Take 1 tablet (12.5 mg total) by mouth 2 (two) times daily with a meal. 12/30/15   [name], MD  clopidogrel (PLAVIX) 75 MG tablet TAKE 1 TABLET BY MOUTH EVERY DAY 12/30/15   [name], MD  diclofenac sodium (VOLTAREN) 1 % GEL APPLY 2 grams topically 4 TIMES DAILY AS NEEDED FOR PAIN 09/23/15   [name], MD  gabapentin (NEURONTIN) 300 MG capsule Take 1 capsule (300 mg total) by mouth 2 (two) times daily. 01/31/16   [name], MD  glucose blood test strip  Use as instructed Dx. Code 250.02 09/28/14   [name], MD  guaiFENesin-codeine 100-10 MG/5ML syrup Take 5 mLs by mouth 3 (three) times daily as needed for cough. 03/02/16   Delsa Grana, PA-C  loratadine (CLARITIN) 10 MG tablet TAKE 1/2 TABLET BY MOUTH DAILY Patient taking differently: TAKE 1/2 TABLET BY MOUTH DAILY as needed for allergies 12/08/15   [name], MD  losartan (COZAAR) 50 MG tablet TAKE 1 TABLET (50 MG TOTAL) BY MOUTH AT BEDTIME. Patient taking differently: Take 50 mg by mouth daily. . 12/30/15   [name], MD  magnesium hydroxide (MILK OF MAGNESIA) 400 MG/5ML suspension Take 30 mLs by mouth daily as needed for mild constipation.    Historical Provider, MD  nicotine (NICODERM CQ - DOSED IN MG/24 HR) 7 mg/24hr patch Place 1 patch (7 mg total) onto the skin daily as needed (nicotine withdrawal). Patient not taking: Reported on 01/19/2016 11/26/15   Ashly M Gottschalk, DO  PATADAY 0.2 % SOLN APPLY 1 DROP TO EYE DAILY. Patient taking differently: APPLY 1 DROP TO BOTH EYES DAILY as needed for irritation 11/23/15   [name], MD  ranitidine (ZANTAC) 150 MG capsule TAKE 1 CAPSULE BY MOUTH 2 TIMES DAILY AS NEEDED FOR heartburn 09/23/15   [name], MD  rosuvastatin (CRESTOR) 20 MG tablet TAKE 1 TABLET BY MOUTH AT BEDTIME 01/10/16   [name], MD  traMADol (ULTRAM) 50 MG tablet Take 1 tablet (50 mg total) by mouth every 8 (eight) hours as needed for severe pain. 03/02/16   Delsa Grana, PA-C   BP 127/81 mmHg  Pulse 73  Temp(Src) 97.6 F (36.4 C) (Oral)  Resp 16  SpO2 98% Physical Exam  Constitutional: She is oriented to person, place, and time. She appears well-developed and well-nourished. No distress.  HENT:  Head: Normocephalic and atraumatic.  Nose: Nose normal.  Mouth/Throat: Oropharynx is clear and moist. No oropharyngeal exudate.  Mild nasal congestion, nasal and oral mucosa normal in appearance, MMM, upper dentures present  Eyes: Conjunctivae  and EOM are normal. Pupils are equal, round, and reactive to light. Right eye exhibits no discharge. Left eye exhibits no discharge. No scleral icterus.  Neck: Normal range of motion. Neck supple. No JVD present. No tracheal deviation present. No thyromegaly present.  Cardiovascular: Normal rate, regular rhythm, normal heart sounds and intact distal pulses.  Exam reveals no gallop and no friction rub.   No murmur heard. No lower extremity edema, symmetrical pulses radial  2+, dorsal pedis 2+  Pulmonary/Chest: Effort normal and breath sounds normal. No stridor. No respiratory distress. She has no wheezes. She has no rales. She exhibits tenderness.  CTA, no Rales, rhonchi or wheeze, right lateral ribs tender to palpation  Abdominal: Soft. Bowel sounds are normal. She exhibits no distension and no mass. There is no tenderness. There is no rebound and no guarding.  Musculoskeletal: Normal range of motion. She exhibits no edema or tenderness.  Lymphadenopathy:    She has no cervical adenopathy.  Neurological: She is alert and oriented to person, place, and time. She has normal reflexes. No cranial nerve deficit. She exhibits normal muscle tone. Coordination normal.  Skin: Skin is warm and dry. No rash noted. She is not diaphoretic. No erythema. No pallor.  Psychiatric: She has a normal mood and affect. Her behavior is normal. Judgment and thought content normal.  Nursing note and vitals reviewed.   ED Course  Procedures (including critical care time) Labs Review Labs Reviewed  BASIC METABOLIC PANEL - Abnormal; Notable for the following:    Glucose, Bld 283 (*)    Creatinine, Ser 1.11 (*)    Calcium 10.8 (*)    GFR calc non Af Amer 42 (*)    GFR calc Af Amer 49 (*)    All other components within normal limits  CBC - Abnormal; Notable for the following:    Hemoglobin 10.2 (*)    HCT 31.7 (*)    MCV 70.8 (*)    MCH 22.8 (*)    RDW 16.1 (*)    All other components within normal limits   I-STAT TROPOININ, ED    Imaging Review Dg Chest 2 View  03/02/2016  CLINICAL DATA:  Right chest pain beginning last night. Shortness of breath. Initial encounter. EXAM: CHEST  2 VIEW COMPARISON:  PA and lateral chest 11/24/2015. FINDINGS: The lungs are clear. Heart size is normal. There is no pneumothorax or pleural effusion. No focal bony abnormality. IMPRESSION: No acute disease. Electronically Signed   By: Inge Rise M.D.   On: 03/02/2016 16:17   I have personally reviewed and evaluated these images and lab results as part of my medical decision-making.   EKG Interpretation   Date/Time:  Thursday March 02 2016 15:46:53 EDT Ventricular Rate:  69 PR Interval:  162 QRS Duration: 76 QT Interval:  402 QTC Calculation: 430 R Axis:   -6 Text Interpretation:  Normal sinus rhythm Normal ECG Confirmed by BEATON   MD, ROBERT ([id number]) on 03/02/2016 7:20:01 PM      MDM   82 year old female presents with right rib pain, she believes that she twisted and/or coughed causing rib pain over the last 2 days reproducible with palpation, breathing and movement. She complains of mild URI and cough, she does not complain of any shortness of breath, chest pain, lower extremity edema, orthopnea, PND, exertional dyspnea, fever, sweats, chills. Cardiac workup ordered and resulted prior to my eval.  Pt denies CP, history unconcerning for ACS.  Troponin negative, EKG NSR, remaining labwork consistent with pt's baseline. Case discussed with Dr. Audie Pinto, he agrees with assessment and agrees the patient is safe to discharge home with follow-up with PCP. Will refill patient's home tramadol as she had some relief with this but states she is out. Encouraged expectorants and cough suppressants, will give codeine cough syrup for use only at night, patient warned not to use in combination with tramadol.   Pt is in agreement with plan.  Return precautions reviewed.  Pt d/c'd in good condition, nontoxic appearing,  vital signs stable.  Final diagnoses:  Rib pain on right side  URI (upper respiratory infection)  Cough        Delsa Grana, PA-C 03/05/16 [place], MD 03/09/16 [phone]

## 2016-03-02 NOTE — Discharge Instructions (Signed)
Chest Wall Pain Chest wall pain is pain in or around the bones and muscles of your chest. Sometimes, an injury causes this pain. Sometimes, the cause may not be known. This pain may take several weeks or longer to get better. HOME CARE INSTRUCTIONS  Pay attention to any changes in your symptoms. Take these actions to help with your pain:   Rest as told by your health care provider.   Avoid activities that cause pain. These include any activities that use your chest muscles or your abdominal and side muscles to lift heavy items.   If directed, apply ice to the painful area:  Put ice in a plastic bag.  Place a towel between your skin and the bag.  Leave the ice on for 20 minutes, 2-3 times per day.  Take over-the-counter and prescription medicines only as told by your health care provider.  Do not use tobacco products, including cigarettes, chewing tobacco, and e-cigarettes. If you need help quitting, ask your health care provider.  Keep all follow-up visits as told by your health care provider. This is important. SEEK MEDICAL CARE IF:  You have a fever.  Your chest pain becomes worse.  You have new symptoms. SEEK IMMEDIATE MEDICAL CARE IF:  You have nausea or vomiting.  You feel sweaty or light-headed.  You have a cough with phlegm (sputum) or you cough up blood.  You develop shortness of breath.   This information is not intended to replace advice given to you by your health care provider. Make sure you discuss any questions you have with your health care provider.   Document Released: 10/30/2005 Document Revised: 07/21/2015 Document Reviewed: 01/25/2015 Elsevier Interactive Patient Education 2016 Elsevier Inc.  Costochondritis Costochondritis, sometimes called Tietze syndrome, is a swelling and irritation (inflammation) of the tissue (cartilage) that connects your ribs with your breastbone (sternum). It causes pain in the chest and rib area. Costochondritis usually  goes away on its own over time. It can take up to 6 weeks or longer to get better, especially if you are unable to limit your activities. CAUSES  Some cases of costochondritis have no known cause. Possible causes include:  Injury (trauma).  Exercise or activity such as lifting.  Severe coughing. SIGNS AND SYMPTOMS  Pain and tenderness in the chest and rib area.  Pain that gets worse when coughing or taking deep breaths.  Pain that gets worse with specific movements. DIAGNOSIS  Your health care provider will do a physical exam and ask about your symptoms. Chest X-rays or other tests may be done to rule out other problems. TREATMENT  Costochondritis usually goes away on its own over time. Your health care provider may prescribe medicine to help relieve pain. HOME CARE INSTRUCTIONS   Avoid exhausting physical activity. Try not to strain your ribs during normal activity. This would include any activities using chest, abdominal, and side muscles, especially if heavy weights are used.  Apply ice to the affected area for the first 2 days after the pain begins.  Put ice in a plastic bag.  Place a towel between your skin and the bag.  Leave the ice on for 20 minutes, 2-3 times a day.  Only take over-the-counter or prescription medicines as directed by your health care provider. SEEK MEDICAL CARE IF:  You have redness or swelling at the rib joints. These are signs of infection.  Your pain does not go away despite rest or medicine. SEEK IMMEDIATE MEDICAL CARE IF:   Your pain  increases or you are very uncomfortable.  You have shortness of breath or difficulty breathing.  You cough up blood.  You have worse chest pains, sweating, or vomiting.  You have a fever or persistent symptoms for more than 2-3 days.  You have a fever and your symptoms suddenly get worse. MAKE SURE YOU:   Understand these instructions.  Will watch your condition.  Will get help right away if you are  not doing well or get worse.   This information is not intended to replace advice given to you by your health care provider. Make sure you discuss any questions you have with your health care provider.   Document Released: 08/09/2005 Document Revised: 08/20/2013 Document Reviewed: 06/03/2013 Elsevier Interactive Patient Education 2016 Elsevier Inc.  Upper Respiratory Infection, Adult Most upper respiratory infections (URIs) are a viral infection of the air passages leading to the lungs. A URI affects the nose, throat, and upper air passages. The most common type of URI is nasopharyngitis and is typically referred to as "the common cold." URIs run their course and usually go away on their own. Most of the time, a URI does not require medical attention, but sometimes a bacterial infection in the upper airways can follow a viral infection. This is called a secondary infection. Sinus and middle ear infections are common types of secondary upper respiratory infections. Bacterial pneumonia can also complicate a URI. A URI can worsen asthma and chronic obstructive pulmonary disease (COPD). Sometimes, these complications can require emergency medical care and may be life threatening.  CAUSES Almost all URIs are caused by viruses. A virus is a type of germ and can spread from one person to another.  RISKS FACTORS You may be at risk for a URI if:   You smoke.   You have chronic heart or lung disease.  You have a weakened defense (immune) system.   You are very young or very old.   You have nasal allergies or asthma.  You work in crowded or poorly ventilated areas.  You work in health care facilities or schools. SIGNS AND SYMPTOMS  Symptoms typically develop 2-3 days after you come in contact with a cold virus. Most viral URIs last 7-10 days. However, viral URIs from the influenza virus (flu virus) can last 14-18 days and are typically more severe. Symptoms may include:   Runny or stuffy  (congested) nose.   Sneezing.   Cough.   Sore throat.   Headache.   Fatigue.   Fever.   Loss of appetite.   Pain in your forehead, behind your eyes, and over your cheekbones (sinus pain).  Muscle aches.  DIAGNOSIS  Your health care provider may diagnose a URI by:  Physical exam.  Tests to check that your symptoms are not due to another condition such as:  Strep throat.  Sinusitis.  Pneumonia.  Asthma. TREATMENT  A URI goes away on its own with time. It cannot be cured with medicines, but medicines may be prescribed or recommended to relieve symptoms. Medicines may help:  Reduce your fever.  Reduce your cough.  Relieve nasal congestion. HOME CARE INSTRUCTIONS   Take medicines only as directed by your health care provider.   Gargle warm saltwater or take cough drops to comfort your throat as directed by your health care provider.  Use a warm mist humidifier or inhale steam from a shower to increase air moisture. This may make it easier to breathe.  Drink enough fluid to keep your urine  clear or pale yellow.   Eat soups and other clear broths and maintain good nutrition.   Rest as needed.   Return to work when your temperature has returned to normal or as your health care provider advises. You may need to stay home longer to avoid infecting others. You can also use a face mask and careful hand washing to prevent spread of the virus.  Increase the usage of your inhaler if you have asthma.   Do not use any tobacco products, including cigarettes, chewing tobacco, or electronic cigarettes. If you need help quitting, ask your health care provider. PREVENTION  The best way to protect yourself from getting a cold is to practice good hygiene.   Avoid oral or hand contact with people with cold symptoms.   Wash your hands often if contact occurs.  There is no clear evidence that vitamin C, vitamin E, echinacea, or exercise reduces the chance of  developing a cold. However, it is always recommended to get plenty of rest, exercise, and practice good nutrition.  SEEK MEDICAL CARE IF:   You are getting worse rather than better.   Your symptoms are not controlled by medicine.   You have chills.  You have worsening shortness of breath.  You have brown or red mucus.  You have yellow or brown nasal discharge.  You have pain in your face, especially when you bend forward.  You have a fever.  You have swollen neck glands.  You have pain while swallowing.  You have white areas in the back of your throat. SEEK IMMEDIATE MEDICAL CARE IF:   You have severe or persistent:  Headache.  Ear pain.  Sinus pain.  Chest pain.  You have chronic lung disease and any of the following:  Wheezing.  Prolonged cough.  Coughing up blood.  A change in your usual mucus.  You have a stiff neck.  You have changes in your:  Vision.  Hearing.  Thinking.  Mood. MAKE SURE YOU:   Understand these instructions.  Will watch your condition.  Will get help right away if you are not doing well or get worse.   This information is not intended to replace advice given to you by your health care provider. Make sure you discuss any questions you have with your health care provider.   Document Released: 04/25/2001 Document Revised: 03/16/2015 Document Reviewed: 02/04/2014 Elsevier Interactive Patient Education 2016 Elsevier Inc.  Cough, Adult Coughing is a reflex that clears your throat and your airways. Coughing helps to heal and protect your lungs. It is normal to cough occasionally, but a cough that happens with other symptoms or lasts a long time may be a sign of a condition that needs treatment. A cough may last only 2-3 weeks (acute), or it may last longer than 8 weeks (chronic). CAUSES Coughing is commonly caused by:  Breathing in substances that irritate your lungs.  A viral or bacterial respiratory  infection.  Allergies.  Asthma.  Postnasal drip.  Smoking.  Acid backing up from the stomach into the esophagus (gastroesophageal reflux).  Certain medicines.  Chronic lung problems, including COPD (or rarely, lung cancer).  Other medical conditions such as heart failure. HOME CARE INSTRUCTIONS  Pay attention to any changes in your symptoms. Take these actions to help with your discomfort:  Take medicines only as told by your health care provider.  If you were prescribed an antibiotic medicine, take it as told by your health care provider. Do not stop taking  the antibiotic even if you start to feel better.  Talk with your health care provider before you take a cough suppressant medicine.  Drink enough fluid to keep your urine clear or pale yellow.  If the air is dry, use a cold steam vaporizer or humidifier in your bedroom or your home to help loosen secretions.  Avoid anything that causes you to cough at work or at home.  If your cough is worse at night, try sleeping in a semi-upright position.  Avoid cigarette smoke. If you smoke, quit smoking. If you need help quitting, ask your health care provider.  Avoid caffeine.  Avoid alcohol.  Rest as needed. SEEK MEDICAL CARE IF:   You have new symptoms.  You cough up pus.  Your cough does not get better after 2-3 weeks, or your cough gets worse.  You cannot control your cough with suppressant medicines and you are losing sleep.  You develop pain that is getting worse or pain that is not controlled with pain medicines.  You have a fever.  You have unexplained weight loss.  You have night sweats. SEEK IMMEDIATE MEDICAL CARE IF:  You cough up blood.  You have difficulty breathing.  Your heartbeat is very fast.   This information is not intended to replace advice given to you by your health care provider. Make sure you discuss any questions you have with your health care provider.   Document Released:  04/28/2011 Document Revised: 07/21/2015 Document Reviewed: 01/06/2015 Elsevier Interactive Patient Education [employer].

## 2016-03-03 ENCOUNTER — Ambulatory Visit: Payer: Self-pay | Admitting: Internal Medicine

## 2016-03-14 ENCOUNTER — Other Ambulatory Visit: Payer: Self-pay | Admitting: Family Medicine

## 2016-03-14 DIAGNOSIS — M546 Pain in thoracic spine: Secondary | ICD-10-CM

## 2016-03-14 MED ORDER — TRAMADOL HCL 50 MG PO TABS: 50.0000 mg | ORAL_TABLET | Freq: Three times a day (TID) | ORAL | Status: DC | PRN

## 2016-03-14 NOTE — Telephone Encounter (Signed)
Daughter is calling for a refill on her mother's pain medication to be refilled. Please call when this is ready to pick up. jw

## 2016-03-14 NOTE — Telephone Encounter (Signed)
Tramadol refilled and at the front desk.  Please ask the pt to follow up with me in the next month.  Thanks, [name], MD [facility] Resident  03/14/2016, 5:43 PM

## 2016-03-15 NOTE — Telephone Encounter (Signed)
Pt informed. Dunbar Buras, CMA  

## 2016-03-21 ENCOUNTER — Other Ambulatory Visit: Payer: Self-pay | Admitting: *Deleted

## 2016-03-21 MED ORDER — ROSUVASTATIN CALCIUM 20 MG PO TABS: 20.0000 mg | ORAL_TABLET | Freq: Every day | ORAL | Status: DC

## 2016-03-28 ENCOUNTER — Encounter: Payer: Self-pay | Admitting: Podiatry

## 2016-03-28 ENCOUNTER — Ambulatory Visit (INDEPENDENT_AMBULATORY_CARE_PROVIDER_SITE_OTHER): Payer: Medicare Other | Admitting: Podiatry

## 2016-03-28 ENCOUNTER — Encounter: Payer: Self-pay | Admitting: Family Medicine

## 2016-03-28 ENCOUNTER — Ambulatory Visit (INDEPENDENT_AMBULATORY_CARE_PROVIDER_SITE_OTHER): Payer: Medicare Other | Admitting: Family Medicine

## 2016-03-28 VITALS — BP 165/59 | HR 75 | Temp 98.2°F | Ht 59.0 in | Wt 115.0 lb

## 2016-03-28 DIAGNOSIS — M7742 Metatarsalgia, left foot: Secondary | ICD-10-CM

## 2016-03-28 DIAGNOSIS — M79676 Pain in unspecified toe(s): Secondary | ICD-10-CM | POA: Diagnosis not present

## 2016-03-28 DIAGNOSIS — E119 Type 2 diabetes mellitus without complications: Secondary | ICD-10-CM | POA: Diagnosis not present

## 2016-03-28 DIAGNOSIS — I10 Essential (primary) hypertension: Secondary | ICD-10-CM | POA: Diagnosis not present

## 2016-03-28 DIAGNOSIS — B351 Tinea unguium: Secondary | ICD-10-CM | POA: Diagnosis not present

## 2016-03-28 DIAGNOSIS — R112 Nausea with vomiting, unspecified: Secondary | ICD-10-CM | POA: Diagnosis not present

## 2016-03-28 LAB — BASIC METABOLIC PANEL
BUN: 12 mg/dL (ref 7–25)
CHLORIDE: 105 mmol/L (ref 98–110)
CO2: 24 mmol/L (ref 20–31)
Calcium: 10.5 mg/dL — ABNORMAL HIGH (ref 8.6–10.4)
Creat: 0.94 mg/dL — ABNORMAL HIGH (ref 0.60–0.88)
GLUCOSE: 242 mg/dL — AB (ref 65–99)
POTASSIUM: 3.8 mmol/L (ref 3.5–5.3)
SODIUM: 138 mmol/L (ref 135–146)

## 2016-03-28 LAB — POCT GLYCOSYLATED HEMOGLOBIN (HGB A1C): Hemoglobin A1C: 8.6

## 2016-03-28 NOTE — Progress Notes (Signed)
Patient ID: [name], female   DOB: [birth year], 82 y.o.   MRN: [id number]  Subjective: This patient presents again complaining of thickened and elongated toenails walking wearing shoes and requests toenail debridement. Patient's caregiver is present to treatment room  Objective: Orientated 3 No open skin lesions bilaterally The toenails are hypertrophic, elongated, deformed, discolored and tender direct palpation 6-10  Assessment: Symptomatic onychomycoses 6-10 Type II diabetic Callus formation plantar lateral right heel  Plan: Debridement toenails 6-10 mechanically and electronically without any bleeding Advised patient and patient's caregiver to place pillow under right calf to elevate pressure off the right heel  Reappoint 3 months

## 2016-03-28 NOTE — Patient Instructions (Signed)
Diabetes and Foot Care Diabetes may cause you to have problems because of poor blood supply (circulation) to your feet and legs. This may cause the skin on your feet to become thinner, break easier, and heal more slowly. Your skin may become dry, and the skin may peel and crack. You may also have nerve damage in your legs and feet causing decreased feeling in them. You may not notice minor injuries to your feet that could lead to infections or more serious problems. Taking care of your feet is one of the most important things you can do for yourself.  HOME CARE INSTRUCTIONS  Wear shoes at all times, even in the house. Do not go barefoot. Bare feet are easily injured.  Check your feet daily for blisters, cuts, and redness. If you cannot see the bottom of your feet, use a mirror or ask someone for help.  Wash your feet with warm water (do not use hot water) and mild soap. Then pat your feet and the areas between your toes until they are completely dry. Do not soak your feet as this can dry your skin.  Apply a moisturizing lotion or petroleum jelly (that does not contain alcohol and is unscented) to the skin on your feet and to dry, brittle toenails. Do not apply lotion between your toes.  Trim your toenails straight across. Do not dig under them or around the cuticle. File the edges of your nails with an emery board or nail file.  Do not cut corns or calluses or try to remove them with medicine.  Wear clean socks or stockings every day. Make sure they are not too tight. Do not wear knee-high stockings since they may decrease blood flow to your legs.  Wear shoes that fit properly and have enough cushioning. To break in new shoes, wear them for just a few hours a day. This prevents you from injuring your feet. Always look in your shoes before you put them on to be sure there are no objects inside.  Do not cross your legs. This may decrease the blood flow to your feet.  If you find a minor scrape,  cut, or break in the skin on your feet, keep it and the skin around it clean and dry. These areas may be cleansed with mild soap and water. Do not cleanse the area with peroxide, alcohol, or iodine.  When you remove an adhesive bandage, be sure not to damage the skin around it.  If you have a wound, look at it several times a day to make sure it is healing.  Do not use heating pads or hot water bottles. They may burn your skin. If you have lost feeling in your feet or legs, you may not know it is happening until it is too late.  Make sure your health care provider performs a complete foot exam at least annually or more often if you have foot problems. Report any cuts, sores, or bruises to your health care provider immediately. SEEK MEDICAL CARE IF:   You have an injury that is not healing.  You have cuts or breaks in the skin.  You have an ingrown nail.  You notice redness on your legs or feet.  You feel burning or tingling in your legs or feet.  You have pain or cramps in your legs and feet.  Your legs or feet are numb.  Your feet always feel cold. SEEK IMMEDIATE MEDICAL CARE IF:   There is increasing redness,   swelling, or pain in or around a wound.  There is a red line that goes up your leg.  Pus is coming from a wound.  You develop a fever or as directed by your health care provider.  You notice a bad smell coming from an ulcer or wound.   This information is not intended to replace advice given to you by your health care provider. Make sure you discuss any questions you have with your health care provider.   Document Released: 10/27/2000 Document Revised: 07/02/2013 Document Reviewed: 04/08/2013 Elsevier Interactive Patient Education 2016 Elsevier Inc.  

## 2016-03-28 NOTE — Patient Instructions (Addendum)
Try metatarsal foot pads for your left foot. If you have a difficult time inserting this into your foot, I have referred you to sports medicine, they can do this for you if needed.  Start propping your right foot on a pillow. If this is not helping, get a pressure ulcer dressing to apply to your right heel. If this is not improving, call our office and I can refer you to wound care and they can help you obtain all the supplies you need.

## 2016-03-28 NOTE — Progress Notes (Signed)
Patient ID: [name], female   DOB: 06-Dec-1923, 82 y.o.   MRN: [id number]    Subjective: CC: feet issues  HPI: Patient is a 82 y.o. female with a complicated PMHx presenting to clinic today for concerns about her feet.   Left foot: has pain at the base of the great toe which has been present for several weeks. No tenderness to palpation however notes pain with ambulation. No radiation of pain. She has not tried anything to help alleviate the pain. She wears diabetic shoes. She sees podiatry for nail dermatophytosis.   Right foot has "scales" at the heel. She denies any pain. She does not know how long its been present. She has not tried anything for the it. No warmth, redness, or drainage. No skin break down.   Social History: uses snuff  ROS: All other systems reviewed and are negative besides that noted in HPI.  Past Medical History Patient Active Problem List   Diagnosis Date Noted  . Depression 01/20/2016  . Suicidal ideation 01/19/2016  . Prolapse of female pelvic organs 12/28/2015  . Leg weakness, bilateral 12/08/2015  . Weakness of right lower extremity 11/25/2015  . (HFpEF) heart failure with preserved ejection fraction ([place])   . CAD in native artery   . Emesis, persistent   . Gout 05/14/2015  . Acute delirium 04/15/2015  . Hypertension 04/07/2015  . Dyslipidemia   . Essential hypertension   . Seasonal allergies 02/10/2015  . Stroke-like symptoms   . Stroke-like episode ([place]) 01/25/2015  . Syncope and collapse 01/25/2015  . Accelerated hypertension   . Hypertensive emergency 09/19/2014  . Anemia of chronic illness 08/19/2014  . Acute superficial venous thrombosis of right lower extremity 08/19/2014  . Hypercalcemia 06/09/2014  . Breast cancer, left breast ([place]) 06/09/2014  . Breast cancer, right breast ([place]) 06/09/2014  . Edema of left lower extremity 03/30/2014  . PAD (peripheral artery disease) ([place]) 03/30/2014  . History of colon cancer 12/11/2013  .  History of fall 05/15/2013  . Memory deficit 05/15/2013  . Bilateral leg pain 03/07/2013  . Trigger thumb of right hand 03/07/2013  . Lower extremity edema 02/19/2013  . Stricture and stenosis of esophagus 10/27/2012  . Poor social situation 09/13/2012  . Musculoskeletal chest pain 07/10/2012  . Thoracic back pain 05/24/2012  . Weight loss 04/03/2012  . Insomnia 09/25/2011  . Tobacco abuse 06/02/2011  . History of breast cancer in female, bilateral.  Mastectomies 10/24/2010. 04/26/2011  . Hiatal hernia 03/22/2011  . Colon polyp 03/22/2011  . Poor circulation 03/22/2011  . 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 NEUROPATHY 08/25/2009  . ANEMIA-IRON DEFICIENCY 04/29/2007  . HLD (hyperlipidemia) 01/29/2007  . Diabetes type 2, controlled ([place]) 01/10/2007  . HYPERTENSION, BENIGN SYSTEMIC 01/10/2007  . Coronary atherosclerosis 01/10/2007  . Chronic systolic heart failure ([place]) 01/10/2007  . Asthma 01/10/2007  . Reflux esophagitis 01/10/2007  . Osteoarthritis, multiple sites 01/10/2007    Medications- reviewed and updated Current Outpatient Prescriptions  Medication Sig Dispense Refill  . acetaminophen (TYLENOL) 500 MG tablet Take 500 mg by mouth every 6 (six) hours as needed for moderate pain (pain).    Marland Kitchen albuterol (PROVENTIL HFA;VENTOLIN HFA) 108 (90 BASE) MCG/ACT inhaler Inhale 2 puffs into the lungs every 6 (six) hours as needed. For shortness of breath 1 Inhaler 6  . amLODipine (NORVASC) 5 MG tablet Take 1 tablet (5 mg total) by mouth daily. 30 tablet 6  . aspirin (ASPIRIN CHILDRENS) 81 MG chewable tablet Chew 81 mg  by mouth every other day.     . Blood Pressure KIT Use the blood pressure kit to check you blood pressure twice a day 1 each 0  . capsaicin (ZOSTRIX) 0.025 % cream APPLY TO AFFECTED AREA TWICE A DAY 60 g 0  . carvedilol (COREG) 12.5 MG tablet Take 1 tablet (12.5 mg total) by mouth 2 (two) times daily with a meal. 60 tablet 6  . clopidogrel (PLAVIX) 75 MG tablet  TAKE 1 TABLET BY MOUTH EVERY DAY 30 tablet 6  . diclofenac sodium (VOLTAREN) 1 % GEL APPLY 2 grams topically 4 TIMES DAILY AS NEEDED FOR PAIN 100 g 1  . gabapentin (NEURONTIN) 300 MG capsule Take 1 capsule (300 mg total) by mouth 2 (two) times daily. 60 capsule 6  . glucose blood test strip Use as instructed Dx. Code 250.02 100 each 12  . guaiFENesin-codeine 100-10 MG/5ML syrup Take 5 mLs by mouth 3 (three) times daily as needed for cough. 120 mL 0  . loratadine (CLARITIN) 10 MG tablet TAKE 1/2 TABLET BY MOUTH DAILY (Patient taking differently: TAKE 1/2 TABLET BY MOUTH DAILY as needed for allergies) 15 tablet 0  . losartan (COZAAR) 50 MG tablet TAKE 1 TABLET (50 MG TOTAL) BY MOUTH AT BEDTIME. (Patient taking differently: Take 50 mg by mouth daily. Marland Kitchen) 30 tablet 6  . magnesium hydroxide (MILK OF MAGNESIA) 400 MG/5ML suspension Take 30 mLs by mouth daily as needed for mild constipation.    . nicotine (NICODERM CQ - DOSED IN MG/24 HR) 7 mg/24hr patch Place 1 patch (7 mg total) onto the skin daily as needed (nicotine withdrawal). 28 patch 0  . PATADAY 0.2 % SOLN APPLY 1 DROP TO EYE DAILY. (Patient taking differently: APPLY 1 DROP TO BOTH EYES DAILY as needed for irritation) 2.5 mL 0  . ranitidine (ZANTAC) 150 MG capsule TAKE 1 CAPSULE BY MOUTH 2 TIMES DAILY AS NEEDED FOR heartburn 60 capsule 2  . rosuvastatin (CRESTOR) 20 MG tablet Take 1 tablet (20 mg total) by mouth at bedtime. 30 tablet 6  . traMADol (ULTRAM) 50 MG tablet Take 1 tablet (50 mg total) by mouth every 8 (eight) hours as needed for severe pain. 75 tablet 0  . [DISCONTINUED] cetirizine (ZYRTEC) 5 MG tablet Take 1 tablet (5 mg total) by mouth daily. Take at night time as it can cause some drowsiness 30 tablet 4   No current facility-administered medications for this visit.    Objective: Office vital signs reviewed. BP 165/59 mmHg  Pulse 75  Temp(Src) 98.2 F (36.8 C) (Oral)  Ht '4\' 11"'$  (1.499 m)  Wt 115 lb (52.164 kg)  BMI 23.21  kg/m2   Physical Examination:  General: Awake, alert in NAD, very pleasant.  Feet: onychomycosis noted. Decreased transverse arch.  Tenderness over the base of the 1st MTP on the left foot- no blistering, erythema, or callus formation noted. No pain with squeeze.  Callus formation on the lateral plantar aspect of the right heel with some dried skin scaling. No erythema or skin breakdown noted.   Assessment/Plan: No problem-specific assessment & plan notes found for this encounter. Callus formation: noted over the right heel. Advised patient prop foot on pillow. Also advised her that if she had some discomfort with shoes, she could wear bunion pads vs as ulcer dressing bad to help take some of the pressure off the area.  Pain at base of 1st MTP on the left foot: Most consistent with metatarsalgia (although odd its not effecting  the 2nd and 3rd digits) given decreased transverse arch. Discussed a trial of metatarsal pads vs referral to sports medicine, patient would prefer to try metatarsal pads at this time. Will continue to monitor.   Orders Placed This Encounter  Procedures  . Basic Metabolic Panel  . HgB A1c    No orders of the defined types were placed in this encounter.    [name] PGY-2, [place]

## 2016-04-03 ENCOUNTER — Encounter: Payer: Self-pay | Admitting: Family Medicine

## 2016-04-11 ENCOUNTER — Other Ambulatory Visit: Payer: Self-pay | Admitting: *Deleted

## 2016-04-11 MED ORDER — CAPSAICIN 0.025 % EX CREA: TOPICAL_CREAM | CUTANEOUS | Status: DC

## 2016-04-23 IMAGING — DX DG CHEST 2V
2 series · 2 of 2 positions shown · non-contrast
Comparison: PA and lateral chest 11/24/2015.

CLINICAL DATA: Right chest pain beginning last night. Shortness of
breath. Initial encounter.

EXAM:
CHEST  2 VIEW

[w chest lat]
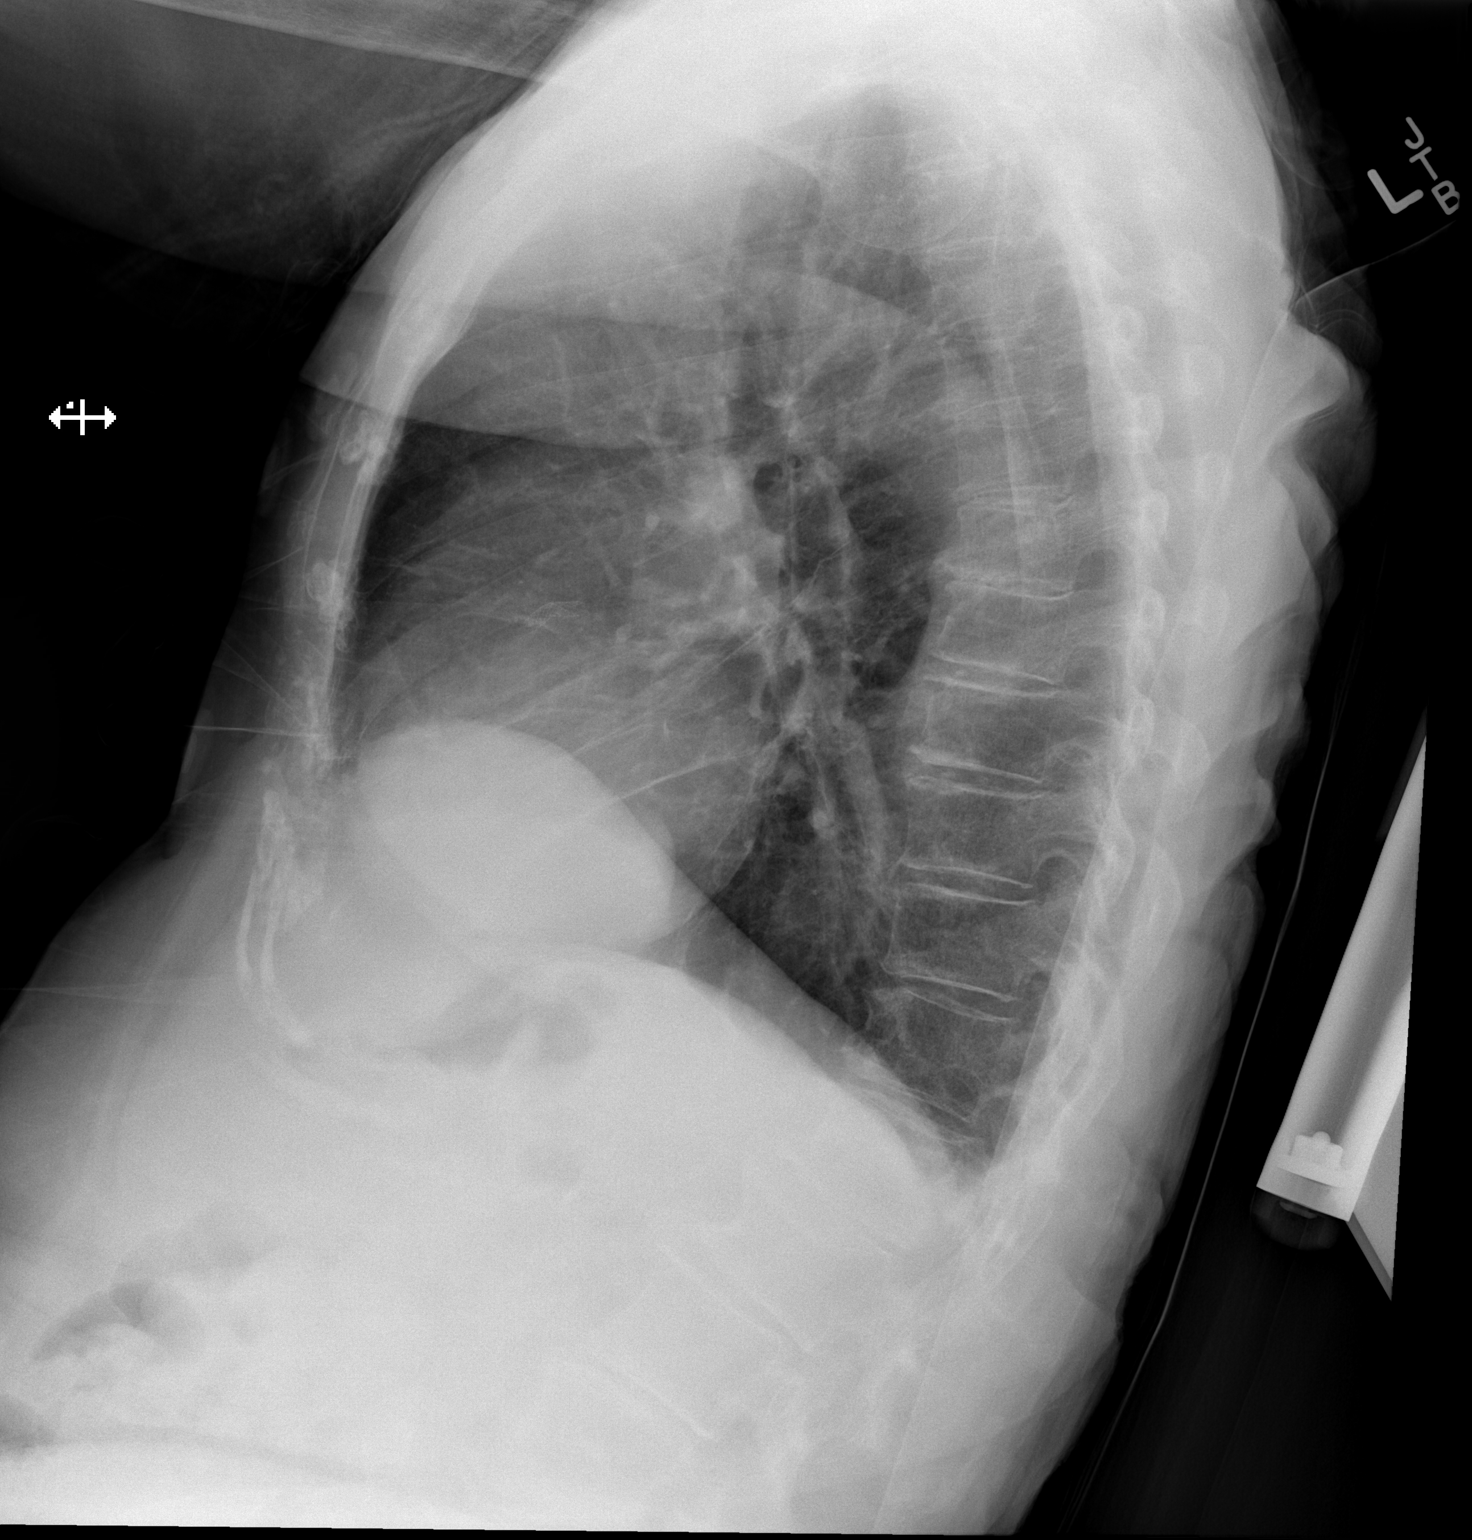

[x chest ap]
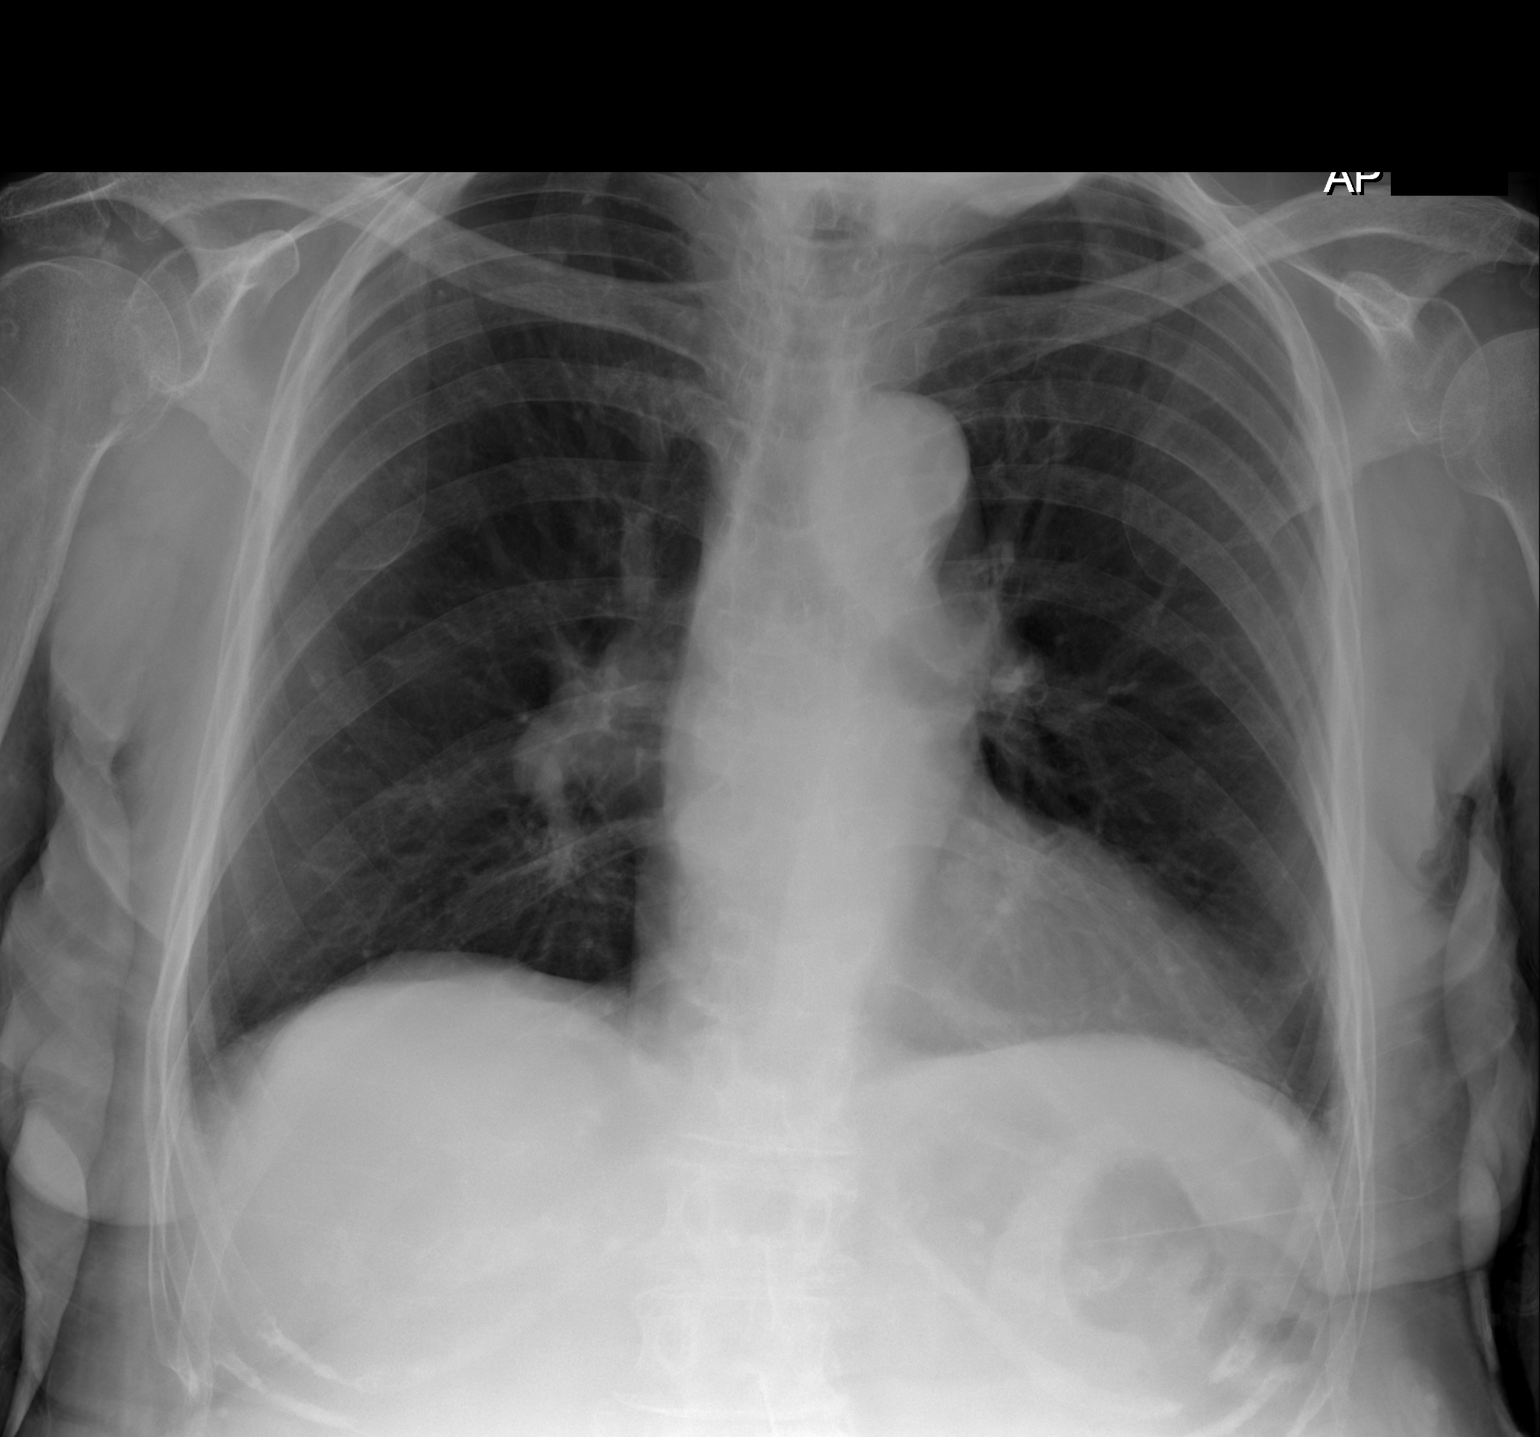

[2 of 2 positions shown; findings below may reference images not displayed]

FINDINGS: The lungs are clear. Heart size is normal. There is no pneumothorax
or pleural effusion. No focal bony abnormality.
IMPRESSION: No acute disease.

## 2016-05-11 ENCOUNTER — Other Ambulatory Visit: Payer: Self-pay | Admitting: Family Medicine

## 2016-05-11 DIAGNOSIS — M546 Pain in thoracic spine: Secondary | ICD-10-CM

## 2016-05-11 NOTE — Telephone Encounter (Signed)
Refill request for Tramadol 

## 2016-05-12 MED ORDER — TRAMADOL HCL 50 MG PO TABS: 50.0000 mg | ORAL_TABLET | Freq: Three times a day (TID) | ORAL | Status: DC | PRN

## 2016-05-12 NOTE — Telephone Encounter (Signed)
Please let the family know that I called in a Rx.  Thanks, [name], MD [facility] Resident  05/12/2016, 8:23 AM

## 2016-05-13 ENCOUNTER — Encounter (HOSPITAL_COMMUNITY): Payer: Self-pay

## 2016-05-13 ENCOUNTER — Emergency Department (HOSPITAL_COMMUNITY): Payer: Medicare Other

## 2016-05-13 ENCOUNTER — Emergency Department (HOSPITAL_COMMUNITY)
Admission: EM | Admit: 2016-05-13 | Discharge: 2016-05-14 | Disposition: A | Discharge status: Home / Self Care | Payer: Medicare Other | Attending: Emergency Medicine | Admitting: Emergency Medicine

## 2016-05-13 DIAGNOSIS — J45909 Unspecified asthma, uncomplicated: Secondary | ICD-10-CM | POA: Insufficient documentation

## 2016-05-13 DIAGNOSIS — S40011A Contusion of right shoulder, initial encounter: Secondary | ICD-10-CM | POA: Diagnosis not present

## 2016-05-13 DIAGNOSIS — W19XXXA Unspecified fall, initial encounter: Secondary | ICD-10-CM | POA: Insufficient documentation

## 2016-05-13 DIAGNOSIS — M545 Low back pain: Secondary | ICD-10-CM | POA: Diagnosis not present

## 2016-05-13 DIAGNOSIS — Y9389 Activity, other specified: Secondary | ICD-10-CM | POA: Diagnosis not present

## 2016-05-13 DIAGNOSIS — Z853 Personal history of malignant neoplasm of breast: Secondary | ICD-10-CM | POA: Diagnosis not present

## 2016-05-13 DIAGNOSIS — Z79899 Other long term (current) drug therapy: Secondary | ICD-10-CM | POA: Insufficient documentation

## 2016-05-13 DIAGNOSIS — Z85038 Personal history of other malignant neoplasm of large intestine: Secondary | ICD-10-CM | POA: Diagnosis not present

## 2016-05-13 DIAGNOSIS — Y999 Unspecified external cause status: Secondary | ICD-10-CM | POA: Diagnosis not present

## 2016-05-13 DIAGNOSIS — R531 Weakness: Secondary | ICD-10-CM | POA: Diagnosis not present

## 2016-05-13 DIAGNOSIS — E1140 Type 2 diabetes mellitus with diabetic neuropathy, unspecified: Secondary | ICD-10-CM | POA: Insufficient documentation

## 2016-05-13 DIAGNOSIS — I2510 Atherosclerotic heart disease of native coronary artery without angina pectoris: Secondary | ICD-10-CM | POA: Diagnosis not present

## 2016-05-13 DIAGNOSIS — Z91010 Allergy to peanuts: Secondary | ICD-10-CM | POA: Diagnosis not present

## 2016-05-13 DIAGNOSIS — M25511 Pain in right shoulder: Secondary | ICD-10-CM | POA: Diagnosis not present

## 2016-05-13 DIAGNOSIS — Y929 Unspecified place or not applicable: Secondary | ICD-10-CM | POA: Diagnosis not present

## 2016-05-13 DIAGNOSIS — R52 Pain, unspecified: Secondary | ICD-10-CM

## 2016-05-13 DIAGNOSIS — S4991XA Unspecified injury of right shoulder and upper arm, initial encounter: Secondary | ICD-10-CM | POA: Diagnosis present

## 2016-05-13 DIAGNOSIS — S3993XA Unspecified injury of pelvis, initial encounter: Secondary | ICD-10-CM | POA: Diagnosis not present

## 2016-05-13 DIAGNOSIS — R404 Transient alteration of awareness: Secondary | ICD-10-CM | POA: Diagnosis not present

## 2016-05-13 DIAGNOSIS — R269 Unspecified abnormalities of gait and mobility: Secondary | ICD-10-CM | POA: Insufficient documentation

## 2016-05-13 DIAGNOSIS — Z96651 Presence of right artificial knee joint: Secondary | ICD-10-CM | POA: Insufficient documentation

## 2016-05-13 DIAGNOSIS — Z7901 Long term (current) use of anticoagulants: Secondary | ICD-10-CM | POA: Diagnosis not present

## 2016-05-13 DIAGNOSIS — R2689 Other abnormalities of gait and mobility: Secondary | ICD-10-CM | POA: Diagnosis not present

## 2016-05-13 DIAGNOSIS — M25559 Pain in unspecified hip: Secondary | ICD-10-CM | POA: Diagnosis not present

## 2016-05-13 DIAGNOSIS — I110 Hypertensive heart disease with heart failure: Secondary | ICD-10-CM | POA: Insufficient documentation

## 2016-05-13 DIAGNOSIS — R262 Difficulty in walking, not elsewhere classified: Secondary | ICD-10-CM

## 2016-05-13 DIAGNOSIS — I252 Old myocardial infarction: Secondary | ICD-10-CM | POA: Insufficient documentation

## 2016-05-13 DIAGNOSIS — I5020 Unspecified systolic (congestive) heart failure: Secondary | ICD-10-CM | POA: Diagnosis not present

## 2016-05-13 DIAGNOSIS — Z7982 Long term (current) use of aspirin: Secondary | ICD-10-CM | POA: Diagnosis not present

## 2016-05-13 DIAGNOSIS — S3992XA Unspecified injury of lower back, initial encounter: Secondary | ICD-10-CM | POA: Diagnosis not present

## 2016-05-13 NOTE — ED Notes (Signed)
Dr. Gilford Raid at the bedside.

## 2016-05-13 NOTE — ED Notes (Signed)
May Daughter [phone] Milagros Evener [phone]

## 2016-05-13 NOTE — ED Notes (Signed)
Per [facility], they were called out for a fall. Pt states her knees gave out on her. Has been c/o generalized weakness for the past 3 weeks. Lives with her daughter. [place] out of chair on Friday and c/o pain to right shoulder and left upper leg. Pt is alert and oriented x 4

## 2016-05-13 NOTE — ED Provider Notes (Signed)
CSN: [id number]     Arrival date & time 05/13/16  2150 History   First MD Initiated Contact with Patient 05/13/16 2158     Chief Complaint  Patient presents with  . Fall   PT IS A 82 YO BF WHO WAS BROUGHT HER BY EMS BECAUSE OF A FALL ON Friday (JUNE 30).  THE PT SAID THAT SHE FELL TRYING TO GET OUT OF A CHAIR THAT WAS HIGHER THAN NORMAL.  PT SAID THAT HER KNEES GAVE OUT (NOT UNCOMMON FOR PT).  PT C/O PAIN TO HER RIGHT SCAPULA AND LEFT UPPER LEG.  (Consider location/radiation/quality/duration/timing/severity/associated sxs/prior Treatment) Patient is a 82 y.o. female presenting with fall. The history is provided by the patient.  Fall This is a new problem. The current episode started yesterday.    Past Medical History  Diagnosis Date  . Breast cancer ([place]) 10/2010    Invasive Ductal Carcinoma, s/p bilateral mastectomy, now on Tamoxifen. Followed by Dr Jamse Arn.   . Diabetes mellitus   . Hypertension   . Hyperlipidemia   . Anemia     Iron def anemia  . CAD (coronary artery disease)   . CHF (congestive heart failure) (HCC)     Systolic   . History of colon cancer   . Neuropathy ([place])   . Allergy   . GERD (gastroesophageal reflux disease)   . Hiatal hernia   . Diverticulosis   . Asthma   . Arthritis   . H/O: GI bleed   . Breast cancer ([place]) 2011    s/p Bilateral masectomy  . Shortness of breath   . Pneumonia 03/2012  . Carotid artery aneurysm ([place]) 08/2010    right ICA, 5 x 63m  . DDD (degenerative disc disease), cervical   . [place]   . [place] 12[place]  . [place]ocardial infarction ([place]   . Hypercalcemia 06/09/2014   Past Surgical History  Procedure Laterality Date  . Breast masectomy  10/2010    Bilateral masectomy by Dr NLucia Gaskinss/p invasive ductal carcinoma.  . Cardiac  catherization  2007    Severe 3-vessel disease.  EF 20-25%.  . Esophagogastroduodenoscopy  2001    Esophageal Tear  . Total knee arthroplasty  2001  . Esophagogastroduodenoscopy   10/27/2012    Procedure: ESOPHAGOGASTRODUODENOSCOPY (EGD);  Surgeon: JIrene Shipper MD;  [facility];  Service: Endoscopy;  Laterality: N/A;  pat  . Breast surgery    . Joint replacement Right 2001    Knee   Family History  Problem Relation Age of Onset  . Sickle cell anemia Other   . Heart disease Mother   . Heart disease Father   . Cancer Daughter 353   breast ca  . Hypertension Daughter   . Cancer Daughter 555   breast ca  . Hypertension Daughter   . Heart disease Son     Poor circulation-Left Leg  . Diabetes Daughter   . Hypertension Daughter   . Hyperlipidemia Daughter     Poor circulation- Toe amputation   Social History  Substance Use Topics  . Smoking status: Never Smoker   . Smokeless tobacco: Current User    Types: Snuff  . Alcohol Use: No   OB History    No data available     Review of Systems  Musculoskeletal:       RIGHT SCAPULA AND RIGHT UPPER LEG  All other systems reviewed and are negative.     Allergies  Peanuts and  Lisinopril  Home Medications   Prior to Admission medications   Medication Sig Start Date End Date Taking? Authorizing Provider  acetaminophen (TYLENOL) 500 MG tablet Take 500 mg by mouth every 6 (six) hours as needed for moderate pain (pain).   Yes Historical Provider, MD  albuterol (PROVENTIL HFA;VENTOLIN HFA) 108 (90 BASE) MCG/ACT inhaler Inhale 2 puffs into the lungs every 6 (six) hours as needed. For shortness of breath 01/18/15  Yes [name], MD  amLODipine (NORVASC) 5 MG tablet Take 1 tablet (5 mg total) by mouth daily. 12/30/15  Yes [name], MD  aspirin (ASPIRIN CHILDRENS) 81 MG chewable tablet Chew 81 mg by mouth every other day.    Yes Historical Provider, MD  capsaicin (ZOSTRIX) 0.025 % cream APPLY TO AFFECTED AREA TWICE A DAY 04/11/16  Yes [name], MD  carvedilol (COREG) 12.5 MG tablet Take 1 tablet (12.5 mg total) by mouth 2 (two) times daily with a meal. 12/30/15  Yes [name], MD   clopidogrel (PLAVIX) 75 MG tablet TAKE 1 TABLET BY MOUTH EVERY DAY 12/30/15  Yes [name], MD  diclofenac sodium (VOLTAREN) 1 % GEL APPLY 2 grams topically 4 TIMES DAILY AS NEEDED FOR PAIN 09/23/15  Yes [name], MD  gabapentin (NEURONTIN) 300 MG capsule Take 1 capsule (300 mg total) by mouth 2 (two) times daily. 01/31/16  Yes [name], MD  loratadine (CLARITIN) 10 MG tablet TAKE 1/2 TABLET BY MOUTH DAILY Patient taking differently: TAKE 1/2 TABLET BY MOUTH DAILY as needed for allergies 12/08/15  Yes [name], MD  losartan (COZAAR) 50 MG tablet TAKE 1 TABLET (50 MG TOTAL) BY MOUTH AT BEDTIME. Patient taking differently: Take 50 mg by mouth daily. . 12/30/15  Yes [name], MD  magnesium hydroxide (MILK OF MAGNESIA) 400 MG/5ML suspension Take 30 mLs by mouth daily as needed for mild constipation.   Yes Historical Provider, MD  PATADAY 0.2 % SOLN APPLY 1 DROP TO EYE DAILY. Patient taking differently: APPLY 1 DROP TO BOTH EYES DAILY as needed for irritation 11/23/15  Yes Crystal Libby Maw, MD  ranitidine (ZANTAC) 150 MG capsule TAKE 1 CAPSULE BY MOUTH 2 TIMES DAILY AS NEEDED FOR heartburn 09/23/15  Yes [name], MD  rosuvastatin (CRESTOR) 20 MG tablet Take 1 tablet (20 mg total) by mouth at bedtime. 03/21/16  Yes [name], MD  traMADol (ULTRAM) 50 MG tablet Take 1 tablet (50 mg total) by mouth every 8 (eight) hours as needed for severe pain. 05/12/16  Yes [name], MD  Blood Pressure KIT Use the blood pressure kit to check you blood pressure twice a day 08/27/15   [name], MD  glucose blood test strip Use as instructed Dx. Code 250.02 09/28/14   [name], MD  guaiFENesin-codeine 100-10 MG/5ML syrup Take 5 mLs by mouth 3 (three) times daily as needed for cough. 03/02/16   Delsa Grana, PA-C  nicotine (NICODERM CQ - DOSED IN MG/24 HR) 7 mg/24hr patch Place 1 patch (7 mg total) onto the skin daily as needed (nicotine withdrawal).  11/26/15   Ashly M Gottschalk, DO   BP 140/49 mmHg  Pulse 65  Temp(Src) 98.3 F (36.8 C) (Oral)  Resp 14  Ht '4\' 11"'$  (1.499 m)  Wt 115 lb (52.164 kg)  BMI 23.21 kg/m2  SpO2 100% Physical Exam  Constitutional: She is oriented to person, place, and time. She appears well-developed and well-nourished.  HENT:  Head: Normocephalic and atraumatic.  Left Ear: External ear normal.  Nose: Nose normal.  Mouth/Throat: Oropharynx is clear and moist.  Eyes: Conjunctivae and EOM are normal. Pupils are equal, round, and reactive to light.  Neck: Normal range of motion. Neck supple.  Cardiovascular: Normal rate, regular rhythm, normal heart sounds and intact distal pulses.   Pulmonary/Chest: Effort normal and breath sounds normal.  Abdominal: Soft. Bowel sounds are normal.  Musculoskeletal: Normal range of motion.  Neurological: She is alert and oriented to person, place, and time.  Skin: Skin is warm and dry.  Psychiatric: She has a normal mood and affect. Her behavior is normal. Judgment and thought content normal.  Nursing note and vitals reviewed.   ED Course  Procedures (including critical care time) Labs Review Labs Reviewed - No data to display  Imaging Review Dg Lumbar Spine Complete  05/13/2016  CLINICAL DATA:  Pain after falling 2 days ago EXAM: LUMBAR SPINE - COMPLETE 4+ VIEW COMPARISON:  01/25/2014 FINDINGS: There is moderate right convex curvature at L3-4. There is stable mild anterior wedge deformity of L2. The other lumbar vertebrae are normal in height. There is moderately severe degenerative disc and facet disease from L2 through the sacrum. No evidence of an acute lumbar spine fracture. Sacroiliac joints appear intact. IMPRESSION: Curvature and moderately severe degenerative changes. Negative for acute lumbar spine fracture. Stable chronic mild compression deformity of L2. Electronically Signed   By: Andreas Newport M.D.   On: 05/13/2016 23:35   Dg Pelvis 1-2 Views  05/13/2016   CLINICAL DATA:  Pain after falling 2 days ago EXAM: PELVIS - 1-2 VIEW COMPARISON:  01/25/2014 FINDINGS: Negative for acute fracture or dislocation. Pubic symphysis and sacroiliac joints appear intact. Moderate arthritic changes are present about both hips. No bone lesion or bony destruction. IMPRESSION: Negative for acute fracture. Electronically Signed   By: Andreas Newport M.D.   On: 05/13/2016 23:36   Dg Scapula Right  05/13/2016  CLINICAL DATA:  Pain after falling 2 days ago. EXAM: RIGHT SCAPULA - 2+ VIEWS COMPARISON:  None. FINDINGS: Negative for acute fracture or dislocation. Moderate degenerative AC joint changes are present with prominent inferior spurring. No bone lesion or bony destruction. IMPRESSION: Negative for acute fracture. Electronically Signed   By: Andreas Newport M.D.   On: 05/13/2016 23:35   I have personally reviewed and evaluated these images and lab results as part of my medical decision-making.   EKG Interpretation   Date/Time:  Saturday May 13 2016 21:58:04 EDT Ventricular Rate:  66 PR Interval:    QRS Duration: 83 QT Interval:  410 QTC Calculation: 430 R Axis:   -7 Text Interpretation:  Sinus rhythm Confirmed by Lanisha Stepanian MD, Zakariya Knickerbocker ([id number])  on 05/13/2016 10:08:04 PM      MDM  PT DID NOT WANT ANY PAIN MEDS.  SHE IS INSTR TO RETURN IF WORSE.  Final diagnoses:  Pain  Contusion of scapula, right, initial encounter  Ambulatory dysfunction     Isla Pence, MD 05/13/16 [phone]

## 2016-05-14 NOTE — ED Notes (Signed)
Spoke to daughter on phone and she is coming to get the patient

## 2016-05-15 ENCOUNTER — Ambulatory Visit (INDEPENDENT_AMBULATORY_CARE_PROVIDER_SITE_OTHER): Payer: Medicare Other | Admitting: Family Medicine

## 2016-05-15 VITALS — BP 157/43 | HR 58 | Temp 97.6°F | Wt 112.6 lb

## 2016-05-15 DIAGNOSIS — R112 Nausea with vomiting, unspecified: Secondary | ICD-10-CM | POA: Diagnosis present

## 2016-05-15 DIAGNOSIS — R29898 Other symptoms and signs involving the musculoskeletal system: Secondary | ICD-10-CM

## 2016-05-15 NOTE — Patient Instructions (Signed)
I will contact your old physical therapist to see if there is something we can do about getting you another walker or something different.  Do the leg exercises that I showed you.

## 2016-05-15 NOTE — Assessment & Plan Note (Signed)
The patient is presenting with fall and fortunately no injuries. She has several studies done in the emergency room to rule out fractures of her shoulder and scapula. She is no tenderness on exam today. She notes that she feels she most likely felt due to simply missing the chair, however she does note significant weakness in her lower external bilaterally. She notes that she has been doing chair exercises that were provided by physical therapy. She has not seen physical therapy in several months. She talks about her daughter having a RW with a seat and wonders if this would be best for her.  - Will put in an order for a PT to evaluate to see if she would benefit from a new RW vs a RW with a seat.

## 2016-05-15 NOTE — Progress Notes (Signed)
Subjective: CC: f/u ED visit  HPI: Patient is a 82 y.o. female with a past medical history of hypertension, dementia, PAD, syncope, vertebrobasilar basilar artery syndrome presenting to clinic today for an ED follow-up after a fall. She's companied by her daughter [name].  She is following up from a ED visit on 05/13/16 after falling. The patient notes that she was going to sit down, and missed the chair. She denies that her knees give out. She notes she fell on her buttocks and on her right shoulder, scapula. She notes she initially had significant pain over the right scapula, but then used capsaicin cream over the area which improved her symptoms completely. She currently denies any pain. She denies any dizziness, lightheadedness, blurred vision, chest pain, short is of breath. She notes that in general she is doing very well. She is ambulating with a rolling walker. She notes she's had a rolling walker since 2002 and would like to get another one if possible. The patient denies any other falls in the last 6 months. She denies any head trauma or loss of consciousness during the event.  Social History: She continues to use snuff. She now lives with her daughter [name].  ROS: All other systems reviewed and are negative.  Past Medical History Patient Active Problem List   Diagnosis Date Noted  . Depression 01/20/2016  . Suicidal ideation 01/19/2016  . Prolapse of female pelvic organs 12/28/2015  . Leg weakness, bilateral 12/08/2015  . Weakness of right lower extremity 11/25/2015  . (HFpEF) heart failure with preserved ejection fraction ([place])   . CAD in native artery   . Emesis, persistent   . Gout 05/14/2015  . Acute delirium 04/15/2015  . Hypertension 04/07/2015  . Dyslipidemia   . Essential hypertension   . Seasonal allergies 02/10/2015  . Stroke-like symptoms   . Stroke-like episode ([place]) 01/25/2015  . Syncope and collapse 01/25/2015  . Accelerated hypertension   . Hypertensive  emergency 09/19/2014  . Anemia of chronic illness 08/19/2014  . Acute superficial venous thrombosis of right lower extremity 08/19/2014  . Hypercalcemia 06/09/2014  . Breast cancer, left breast ([place]) 06/09/2014  . Breast cancer, right breast ([place]) 06/09/2014  . Edema of left lower extremity 03/30/2014  . PAD (peripheral artery disease) ([place]) 03/30/2014  . History of colon cancer 12/11/2013  . History of fall 05/15/2013  . Memory deficit 05/15/2013  . Bilateral leg pain 03/07/2013  . Trigger thumb of right hand 03/07/2013  . Lower extremity edema 02/19/2013  . Stricture and stenosis of esophagus 10/27/2012  . Poor social situation 09/13/2012  . Musculoskeletal chest pain 07/10/2012  . Thoracic back pain 05/24/2012  . Weight loss 04/03/2012  . Insomnia 09/25/2011  . Tobacco abuse 06/02/2011  . History of breast cancer in female, bilateral.  Mastectomies 10/24/2010. 04/26/2011  . Hiatal hernia 03/22/2011  . Colon polyp 03/22/2011  . Poor circulation 03/22/2011  . 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 NEUROPATHY 08/25/2009  . ANEMIA-IRON DEFICIENCY 04/29/2007  . HLD (hyperlipidemia) 01/29/2007  . Diabetes type 2, controlled ([place]) 01/10/2007  . HYPERTENSION, BENIGN SYSTEMIC 01/10/2007  . Coronary atherosclerosis 01/10/2007  . Chronic systolic heart failure ([place]) 01/10/2007  . Asthma 01/10/2007  . Reflux esophagitis 01/10/2007  . Osteoarthritis, multiple sites 01/10/2007    Medications- reviewed and updated Current Outpatient Prescriptions  Medication Sig Dispense Refill  . acetaminophen (TYLENOL) 500 MG tablet Take 500 mg by mouth every 6 (six) hours as needed for moderate pain (pain).    Marland Kitchen  albuterol (PROVENTIL HFA;VENTOLIN HFA) 108 (90 BASE) MCG/ACT inhaler Inhale 2 puffs into the lungs every 6 (six) hours as needed. For shortness of breath 1 Inhaler 6  . amLODipine (NORVASC) 5 MG tablet Take 1 tablet (5 mg total) by mouth daily. 30 tablet 6  . aspirin (ASPIRIN  CHILDRENS) 81 MG chewable tablet Chew 81 mg by mouth every other day.     . Blood Pressure KIT Use the blood pressure kit to check you blood pressure twice a day 1 each 0  . capsaicin (ZOSTRIX) 0.025 % cream APPLY TO AFFECTED AREA TWICE A DAY 60 g 1  . carvedilol (COREG) 12.5 MG tablet Take 1 tablet (12.5 mg total) by mouth 2 (two) times daily with a meal. 60 tablet 6  . clopidogrel (PLAVIX) 75 MG tablet TAKE 1 TABLET BY MOUTH EVERY DAY 30 tablet 6  . diclofenac sodium (VOLTAREN) 1 % GEL APPLY 2 grams topically 4 TIMES DAILY AS NEEDED FOR PAIN 100 g 1  . gabapentin (NEURONTIN) 300 MG capsule Take 1 capsule (300 mg total) by mouth 2 (two) times daily. 60 capsule 6  . glucose blood test strip Use as instructed Dx. Code 250.02 100 each 12  . guaiFENesin-codeine 100-10 MG/5ML syrup Take 5 mLs by mouth 3 (three) times daily as needed for cough. 120 mL 0  . loratadine (CLARITIN) 10 MG tablet TAKE 1/2 TABLET BY MOUTH DAILY (Patient taking differently: TAKE 1/2 TABLET BY MOUTH DAILY as needed for allergies) 15 tablet 0  . losartan (COZAAR) 50 MG tablet TAKE 1 TABLET (50 MG TOTAL) BY MOUTH AT BEDTIME. (Patient taking differently: Take 50 mg by mouth daily. Marland Kitchen) 30 tablet 6  . magnesium hydroxide (MILK OF MAGNESIA) 400 MG/5ML suspension Take 30 mLs by mouth daily as needed for mild constipation.    . nicotine (NICODERM CQ - DOSED IN MG/24 HR) 7 mg/24hr patch Place 1 patch (7 mg total) onto the skin daily as needed (nicotine withdrawal). 28 patch 0  . PATADAY 0.2 % SOLN APPLY 1 DROP TO EYE DAILY. (Patient taking differently: APPLY 1 DROP TO BOTH EYES DAILY as needed for irritation) 2.5 mL 0  . ranitidine (ZANTAC) 150 MG capsule TAKE 1 CAPSULE BY MOUTH 2 TIMES DAILY AS NEEDED FOR heartburn 60 capsule 2  . rosuvastatin (CRESTOR) 20 MG tablet Take 1 tablet (20 mg total) by mouth at bedtime. 30 tablet 6  . traMADol (ULTRAM) 50 MG tablet Take 1 tablet (50 mg total) by mouth every 8 (eight) hours as needed for severe  pain. 75 tablet 0  . [DISCONTINUED] cetirizine (ZYRTEC) 5 MG tablet Take 1 tablet (5 mg total) by mouth daily. Take at night time as it can cause some drowsiness 30 tablet 4   No current facility-administered medications for this visit.    Objective: Office vital signs reviewed. BP 157/43 mmHg  Pulse 58  Temp(Src) 97.6 F (36.4 C) (Oral)  Wt 112 lb 9.6 oz (51.075 kg)  SpO2 99%   Physical Examination:  General: Awake, alert, well- nourished, NAD. Pleasant. Cardio: RRR, no m/r/g noted.  Pulm: No increased WOB.  CTAB, without wheezes, rhonchi or crackles noted.  MSK: Back and shoulders normal to inspection. No tenderness of the patient over the spinous processes, the scapula, or the shoulder. She has full range of motion. Strength intact in the upper and lower extremities bilaterally. Sensation intact bilaterally.  Gait: The patient is slow to get up. She ambulates with a walker but does not seem to  favor one leg or the other.  Assessment/Plan: Leg weakness, bilateral The patient is presenting with fall and fortunately no injuries. She has several studies done in the emergency room to rule out fractures of her shoulder and scapula. She is no tenderness on exam today. She notes that she feels she most likely felt due to simply missing the chair, however she does note significant weakness in her lower external bilaterally. She notes that she has been doing chair exercises that were provided by physical therapy. She has not seen physical therapy in several months. She talks about her daughter having a RW with a seat and wonders if this would be best for her.  - Will put in an order for a PT to evaluate to see if she would benefit from a new RW vs a RW with a seat.    Orders Placed This Encounter  Procedures  . Ambulatory referral to Physical Therapy    Referral Priority:  Routine    Referral Type:  Physical Medicine    Referral Reason:  Specialty Services Required    Requested Specialty:   Physical Therapy    Number of Visits Requested:  1    No orders of the defined types were placed in this encounter.    [name] PGY-2, [place]

## 2016-06-01 ENCOUNTER — Other Ambulatory Visit: Payer: Self-pay | Admitting: Family Medicine

## 2016-06-01 ENCOUNTER — Ambulatory Visit: Payer: Medicare Other | Admitting: Family Medicine

## 2016-06-01 VITALS — BP 152/60 | HR 68 | Temp 98.3°F

## 2016-06-01 DIAGNOSIS — F0390 Unspecified dementia without behavioral disturbance: Secondary | ICD-10-CM | POA: Insufficient documentation

## 2016-06-01 DIAGNOSIS — R29898 Other symptoms and signs involving the musculoskeletal system: Secondary | ICD-10-CM

## 2016-06-01 DIAGNOSIS — Z9181 History of falling: Secondary | ICD-10-CM

## 2016-06-01 DIAGNOSIS — I10 Essential (primary) hypertension: Secondary | ICD-10-CM | POA: Diagnosis not present

## 2016-06-01 DIAGNOSIS — R269 Unspecified abnormalities of gait and mobility: Secondary | ICD-10-CM

## 2016-06-01 DIAGNOSIS — S99929A Unspecified injury of unspecified foot, initial encounter: Secondary | ICD-10-CM | POA: Diagnosis not present

## 2016-06-01 DIAGNOSIS — F03A0 Unspecified dementia, mild, without behavioral disturbance, psychotic disturbance, mood disturbance, and anxiety: Secondary | ICD-10-CM

## 2016-06-01 DIAGNOSIS — W19XXXA Unspecified fall, initial encounter: Secondary | ICD-10-CM

## 2016-06-01 NOTE — Assessment & Plan Note (Signed)
Blood pressure at goal. Patient noted to be slightly orthostatic on exam, however denied any dizziness or lightheadedness with this. -We'll continue to monitor her blood pressures and symptoms, consider discontinuing amlodipine if needed.

## 2016-06-01 NOTE — Assessment & Plan Note (Signed)
With a MMSE of 22/30 and significant impairment at home, she meets criteria for mild dementia. Some of her impairment in ADLS most likely arises from her significant weakness as opposed to a change in cognition which could suggest mild dementia vs mild cognitive impairment.  Most recent CT head revealed atrophy and chronic small vessel ischemia as well as remove lacunar infarct. TSH within the last year has been normal. We have not tested for HIV or syphilis, however given her stability and age, I am unsure how far we'd like to evaluate this in the future.

## 2016-06-01 NOTE — Assessment & Plan Note (Addendum)
From my home visit there are several things that concern me.  While she has no issues getting up from the chair at our office, she was noted to significantly struggle with trying to get out of the chair at home with her rolling walker on the carpet. Additionally, the rolling walker was noted to be caught in the breast in the living room.  She does not have any side rails in the bathroom. She notes that she uses a counter to get up and off the toilet.  She is noted to have significant lower extremity weakness which only worsened her likelihood of a repeat fall. -We'll order home health physical therapy for a home safety evaluation -We'll order a 3 in one chair for the bathroom -We will order a chair lift, I am unsure how much Medicare will cover for this, but I feel she would benefit significantly from this. -Her daughter inquires about an emergency call button. Unfortunately, Medicare does not cover this. We discussed that it may be beneficial to invest in this with her on money or have her consistently carrying around the cordless telephone when she up and alone (which her daughter notes is very rarely). - Also advised the patient to follow up with her ophthalmologist given her vision screening today.  Medical Necessity for Lift Chair  Does the patient have severe arthritis of the hip or knee? Yes, of her knees bilaterally, worse on the right.   Does the patient have a severe neuromuscular disease? No     Is the patient completely incapable of standing up from a regular armchair or any chair in his/her home? She struggles with standing with the use of assistive devices and there is a significant concern about falling.   Once standing, does the patient have the ability to ambulate? Yes  Have all appropriate therapeutic modalities to enable the patient to transfer from a chair to a standing position (e.g., medication, physical therapy) been tried and failed? Yes If YES, this is documented in the  patient's medical record? Yes, and the patient will have PT follow up again.     EST. LENGTH OF NEED (# OF MONTHS): _99   1-99 (99=LIFETIME)   DIAGNOSIS CODES: Z91.81, M19.90

## 2016-06-01 NOTE — Progress Notes (Signed)
[place] Visit   Patient is accompanied by: daughter and primary caregiver, [name]  Primary caregiver: daughter Patient's lives with their family. Patient information was obtained from patient and relative(s). History/Exam limitations: none. Primary Care Provider: Kathrine Cords, MD Reason for referral:  Chief Complaint  Patient presents with  . Fall   History Chief Complaint  Patient presents with  . Fall    HPI by problems:  Ms. Camp is a 82 year old female with a past history of hypertension, chronic systolic heart failure, PAD, history of falls, acid reflux, type 2 diabetes, breast cancer status post bilateral masectomy, and colon cancer being evaluated for a home visit due to concerns of recent falls.  Fall: The patient had a fall at the end of June. Initially, the ED note stated that she had difficulty getting out of a chair that was higher than normal and her legs gave out. When I saw the patient, the patient notes that she was going to sit down and missed the chair completely when attempting to sit.  Her daughter who accompanies her notes that she was cooking in the kitchen prior to this and she was worried she became overheated. She notes when she went to try to sit down, the chair and fell on her right shoulder.    Dizziness prior to fall:  no Syncope prior to fall:  no Vertigo:  no Chest Pain/Palpitations:  no Loss of balance:   no Slip: no Trip: no Slide:  no What Body parts struck object(s) or ground: R shoulder What object or surface was struck: hardwood floor  Injury: yes, contusion to R shoulder that improved  Associated acute Illness no Medications (new or dose changes):  no  Worry about falling:  No, however later noted to have impaired vision examination.   Hx of orthostatic dizziness: no Hx of problems with legs (strength, pain):  yes, knee pain, s/p surgery on the R knee Hx of problems with feet: yes, pain in R heel where dry skin is,  most notable at night when pressure is constantly on the heel Hx of urgency incontinence:  no Hx of problems with balance: no Hx of problems with Gait: yes, uses a RW   Hx of Parkinsons Disease: no  Hx of Strokes: yes, per previous CT (remote lacunar infarcts) Hx of Cardiac Disease:  yes Hx of Cogntive Impairment: no  Hx problems with transfers:  yes Hx of WC dependence:  no   Patient does not fear falling or worries about falling She use a  (cane,walker,WC):  yes, RW She must hold onto furtniture with walking in home.  She has to push up with her hands when standing up from chair She had trouble stepping up on curbs She no has to rush to the toilet to urinate  She has not lost  feeling in her feet  No  medications make her lightheaded or tired She denies take medications to fall asleep   Outpatient Encounter Prescriptions as of 06/01/2016  Medication Sig  . acetaminophen (TYLENOL) 500 MG tablet Take 500 mg by mouth every 6 (six) hours as needed for moderate pain (pain).  Marland Kitchen albuterol (PROVENTIL HFA;VENTOLIN HFA) 108 (90 BASE) MCG/ACT inhaler Inhale 2 puffs into the lungs every 6 (six) hours as needed. For shortness of breath  . amLODipine (NORVASC) 5 MG tablet Take 1 tablet (5 mg total) by mouth daily.  Marland Kitchen aspirin (ASPIRIN CHILDRENS) 81 MG chewable tablet Chew 81 mg by mouth every other day.   Marland Kitchen  Blood Pressure KIT Use the blood pressure kit to check you blood pressure twice a day  . capsaicin (ZOSTRIX) 0.025 % cream APPLY TO AFFECTED AREA TWICE A DAY  . carvedilol (COREG) 12.5 MG tablet Take 1 tablet (12.5 mg total) by mouth 2 (two) times daily with a meal.  . clopidogrel (PLAVIX) 75 MG tablet TAKE 1 TABLET BY MOUTH EVERY DAY  . diclofenac sodium (VOLTAREN) 1 % GEL APPLY 2 grams topically 4 TIMES DAILY AS NEEDED FOR PAIN  . gabapentin (NEURONTIN) 300 MG capsule Take 1 capsule (300 mg total) by mouth 2 (two) times daily.  Marland Kitchen glucose blood test strip Use as instructed Dx. Code 250.02   . loratadine (CLARITIN) 10 MG tablet TAKE 1/2 TABLET BY MOUTH DAILY (Patient taking differently: TAKE 1/2 TABLET BY MOUTH DAILY as needed for allergies)  . losartan (COZAAR) 50 MG tablet TAKE 1 TABLET (50 MG TOTAL) BY MOUTH AT BEDTIME. (Patient taking differently: Take 50 mg by mouth daily. Marland Kitchen)  . magnesium hydroxide (MILK OF MAGNESIA) 400 MG/5ML suspension Take 30 mLs by mouth daily as needed for mild constipation.  Marland Kitchen PATADAY 0.2 % SOLN APPLY 1 DROP TO EYE DAILY. (Patient taking differently: APPLY 1 DROP TO BOTH EYES DAILY as needed for irritation)  . ranitidine (ZANTAC) 150 MG capsule TAKE 1 CAPSULE BY MOUTH 2 TIMES DAILY AS NEEDED FOR heartburn  . rosuvastatin (CRESTOR) 20 MG tablet Take 1 tablet (20 mg total) by mouth at bedtime.  . traMADol (ULTRAM) 50 MG tablet Take 1 tablet (50 mg total) by mouth every 8 (eight) hours as needed for severe pain.   No facility-administered encounter medications on file as of 06/01/2016.   History Patient Active Problem List   Diagnosis Date Noted  . Mild dementia 06/01/2016  . Depression 01/20/2016  . Suicidal ideation 01/19/2016  . Prolapse of female pelvic organs 12/28/2015  . Leg weakness, bilateral 12/08/2015  . Weakness of right lower extremity 11/25/2015  . (HFpEF) heart failure with preserved ejection fraction ([place])   . CAD in native artery   . Gout 05/14/2015  . Dyslipidemia   . Essential hypertension   . Seasonal allergies 02/10/2015  . Stroke-like symptoms   . Stroke-like episode ([place]) 01/25/2015  . Syncope and collapse 01/25/2015  . Anemia of chronic illness 08/19/2014  . Acute superficial venous thrombosis of right lower extremity 08/19/2014  . Hypercalcemia 06/09/2014  . Breast cancer, left breast ([place]) 06/09/2014  . Breast cancer, right breast ([place]) 06/09/2014  . Edema of left lower extremity 03/30/2014  . PAD (peripheral artery disease) ([place]) 03/30/2014  . History of colon cancer 12/11/2013  . History of fall 05/15/2013  .  Bilateral leg pain 03/07/2013  . Lower extremity edema 02/19/2013  . Stricture and stenosis of esophagus 10/27/2012  . Thoracic back pain 05/24/2012  . Tobacco abuse 06/02/2011  . History of breast cancer in female, bilateral.  Mastectomies 10/24/2010. 04/26/2011  . Hiatal hernia 03/22/2011  . Colon polyp 03/22/2011  . Poor circulation 03/22/2011  . 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 NEUROPATHY 08/25/2009  . ANEMIA-IRON DEFICIENCY 04/29/2007  . HLD (hyperlipidemia) 01/29/2007  . Diabetes type 2, controlled ([place]) 01/10/2007  . HYPERTENSION, BENIGN SYSTEMIC 01/10/2007  . Coronary atherosclerosis 01/10/2007  . Chronic systolic heart failure ([place]) 01/10/2007  . Asthma 01/10/2007  . Reflux esophagitis 01/10/2007  . Osteoarthritis, multiple sites 01/10/2007   Past Medical History  Diagnosis Date  . Breast cancer ([place]) 10/2010    Invasive Ductal Carcinoma, s/p bilateral  mastectomy, now on Tamoxifen. Followed by Dr Jamse Arn.   . Diabetes mellitus   . Hypertension   . Hyperlipidemia   . Anemia     Iron def anemia  . CAD (coronary artery disease)   . CHF (congestive heart failure) ([facility])     Systolic   . History of colon cancer   . Neuropathy ([place])   . Allergy   . GERD (gastroesophageal reflux disease)   . Hiatal hernia   . Diverticulosis   . Asthma   . Arthritis   . H/O: GI bleed   . Breast cancer ([place]) 2011    s/p Bilateral masectomy  . Shortness of breath   . Pneumonia 03/2012  . Carotid artery aneurysm ([place]) 08/2010    right ICA, 5 x 61m  . DDD (degenerative disc disease), cervical   . Colon cancer ([place]   . Colon cancer ([place] 12/11/2013  . Myocardial infarction ([place]   . Hypercalcemia 06/09/2014   Past Surgical History  Procedure Laterality Date  . Breast masectomy  10/2010    Bilateral masectomy by Dr NLucia Gaskinss/p invasive ductal carcinoma.  . Cardiac  catherization  2007    Severe 3-vessel disease.  EF 20-25%.  . Esophagogastroduodenoscopy  2001     Esophageal Tear  . Total knee arthroplasty  2001  . Esophagogastroduodenoscopy  10/27/2012    Procedure: ESOPHAGOGASTRODUODENOSCOPY (EGD);  Surgeon: JIrene Shipper MD;  [facility];  Service: Endoscopy;  Laterality: N/A;  pat  . Breast surgery    . Joint replacement Right 2001    Knee   Family History  Problem Relation Age of Onset  . Sickle cell anemia Other   . Heart disease Mother   . Heart disease Father   . Cancer Daughter 370   breast ca  . Hypertension Daughter   . Cancer Daughter 553   breast ca  . Hypertension Daughter   . Heart disease Son     Poor circulation-Left Leg  . Diabetes Daughter   . Hypertension Daughter   . Hyperlipidemia Daughter     Poor circulation- Toe amputation   Social History: She is widowed. She lives with her daughter AGlenard Haringwho helps care for her. She has several daughters who all partake in her healthcare. She has an aide that comes 2-3 hours 6 days a week.   Family History of Dementia: No   Basic Activities of Daily Living  ADLs Independent Needs Assistance Dependent  Bathing   X- sponge baths  Dressing  X   Ambulation  X- RW   Toileting X    Eating X     Instrumental Activities of Daily Living IADL Independent Needs Assistance Dependent  Cooking  X   Housework   X  Manage Medications   X  Manage the telephone X    Shopping for food, clothes, Meds, etc   X  Use transportation   X  Manage Finances   X    Caregivers in home: daughter   Diet: Regular, Diabetic Nutritional supplements: Ensure Plus  Geriatric Syndromes: Constipation no ,   Incontinence yes  Dizziness no   Syncope no   Skin problems yes   Visual Impairment yes- had cataracts removed and last saw SGershon Crane6 months ago.  Hearing impairment no  Eating impairment no  Impaired Memory or Cognition no   Behavioral problems no   Sleep problems no   Weight loss no    ROS Denies fevers/chills; denies changes in  appetite; denies changes in weight;   Denies changes in vision / hearing / smell / taste; Denies runny nose / ear pain or discharge / sore throat / sinus congestion / cough/w phlegm; Denies chest congestion / wheezing;  Denies chest pain; denies heart beating slower/thumps inside chest; denies racing heart/flutter; Denies dysuria; denies hematuria;  Denies constipation; denies melena/hematochezia; denies diarrhea;  Denies abdominal discomfort/gaseous bloating; denies N/V; denies heart burn;  Denies recent unsteady gait; endorses falls and difficulty standing from a sitting position.   Denies unilateral weakness / clumsiness / tingling / numbness; denies tremors;  Denies sadness / anxiety / suicidal tendencies  Vital Signs   There is no weight on file to calculate BMI. CrCl cannot be calculated (Unknown ideal weight.). There is no height or weight on file to calculate BSA. Filed Vitals:   06/01/16 1537  BP: 152/60  Pulse: 68  Temp: 98.3 F (36.8 C)   Wt Readings from Last 3 Encounters:  05/15/16 112 lb 9.6 oz (51.075 kg)  05/13/16 115 lb (52.164 kg)  03/28/16 115 lb (52.164 kg)    Visual Acuity Screening   Right eye Left eye Both eyes  Without correction:     With correction: 20/100 20/70 20/70    Physical Examination:  VS reviewed Orthostatics obtained: 152/60, P68 sitting; 132/60, P68 standing  GEN: Alert, Cooperative, Groomed, NAD HEENT: Snuff in mouth EXT: Trace peripheral leg edema. R heel wrapped in gauze. When padding removed, noted to have dry, hyperpigmentation at the back of the heel with mild tenderness to palpation, no erythema or blanching.   Unable to palpate bilateral pedal pulses.  SKIN: No lesion nor rashes of face/extremities Neuro: Oriented to person, place, and time Gait: Patient has significant issues rising from a seated position, even with the use of her arms and rolling walker. The rolling walker wheels come completed off the ground as she's trying to propel herself up. She is noted to  have a very slow gait with significant pronation and collapse of her longitudinal arches bilaterally. No chew ataxia noted.  Timed Up & Go Test:  Did not perform as the patient had difficulty standing and required the use of her rolling walker Sit-to-Stand Test: Unable to perform  Psych: Normal affect/thought/speech/language   Mini-Mental State Examination or Montreal Cognitive Assessment:  Patient did  require additional cues or prompts to complete tasks. Patient was cooperative and attentive to testing tasks Patient did  appear motivated to perform well  MMSE - Mini Mental State Exam 06/01/2016 05/15/2013  Orientation to time 5 5  Orientation to Place 5 5  Registration 3 2  Attention/ Calculation 0 0  Recall 2 3  Language- name 2 objects 2 2  Language- repeat 0 1  Language- follow 3 step command 3 3  Language- read & follow direction 1 1  Write a sentence 1 1  Copy design 0 0  Total score 22 23         Labs  Lab Results  Component Value Date   [id number] 641 07/07/2013    Lab Results  Component Value Date   FOLATE 17.6 01/21/2013    Lab Results  Component Value Date   TSH 3.560 04/07/2015    No results found for: RPR    Chemistry      Component Value Date/Time   NA 138 03/28/2016 1601   NA 143 12/10/2014 1130   K 3.8 03/28/2016 1601   K 3.7 12/10/2014 1130   CL 105 03/28/2016  1601   CL 105 12/11/2012 1349   CO2 24 03/28/2016 1601   CO2 24 12/10/2014 1130   BUN 12 03/28/2016 1601   BUN 18.3 12/10/2014 1130   CREATININE 0.94* 03/28/2016 1601   CREATININE 1.11* 03/02/2016 1600   CREATININE 1.2* 12/10/2014 1130      Component Value Date/Time   CALCIUM 10.5* 03/28/2016 1601   CALCIUM 10.1 12/10/2014 1130   ALKPHOS 52 01/19/2016 2053   ALKPHOS 38* 12/10/2014 1130   AST 17 01/19/2016 2053   AST 10 12/10/2014 1130   ALT 10* 01/19/2016 2053   ALT <6 12/10/2014 1130   BILITOT 0.3 01/19/2016 2053   BILITOT 0.28 12/10/2014 1130       Lab Results   Component Value Date   HGBA1C 8.6 03/28/2016      Lab Results  Component Value Date   WBC 5.0 03/02/2016   HGB 10.2* 03/02/2016   HCT 31.7* 03/02/2016   MCV 70.8* 03/02/2016   PLT 179 03/02/2016    No results found for this or any previous visit (from the past 24 hour(s)).   Assessment and Plan: Problem List Items Addressed This Visit      Cardiovascular and Mediastinum   HYPERTENSION, BENIGN SYSTEMIC - Primary    Blood pressure at goal. Patient noted to be slightly orthostatic on exam, however denied any dizziness or lightheadedness with this. -We'll continue to monitor her blood pressures and symptoms, consider discontinuing amlodipine if needed.        Nervous and Auditory   Mild dementia    With a MMSE of 22/30 and significant impairment at home, she meets criteria for mild dementia. Some of her impairment in ADLS most likely arises from her significant weakness as opposed to a change in cognition which could suggest mild dementia vs mild cognitive impairment.  Most recent CT head revealed atrophy and chronic small vessel ischemia as well as remove lacunar infarct. TSH within the last year has been normal. We have not tested for HIV or syphilis, however given her stability and age, I am unsure how far we'd like to evaluate this in the future.         Other   History of fall    From my home visit there are several things that concern me.  While she has no issues getting up from the chair at our office, she was noted to significantly struggle with trying to get out of the chair at home with her rolling walker on the carpet. Additionally, the rolling walker was noted to be caught in the breast in the living room.  She does not have any side rails in the bathroom. She notes that she uses a counter to get up and off the toilet.  She is noted to have significant lower extremity weakness which only worsened her likelihood of a repeat fall. -We'll order home health physical therapy  for a home safety evaluation -We'll order a 3 in one chair for the bathroom -We will order a chair lift, I am unsure how much Medicare will cover for this, but I feel she would benefit significantly from this. -Her daughter inquires about an emergency call button. Unfortunately, Medicare does not cover this. We discussed that it may be beneficial to invest in this with her on money or have her consistently carrying around the cordless telephone when she up and alone (which her daughter notes is very rarely). - Also advised the patient to follow up with her ophthalmologist given  her vision screening today.  Medical Necessity for Lift Chair  Does the patient have severe arthritis of the hip or knee? Yes, of her knees bilaterally, worse on the right.   Does the patient have a severe neuromuscular disease? No     Is the patient completely incapable of standing up from a regular armchair or any chair in his/her home? She struggles with standing with the use of assistive devices and there is a significant concern about falling.   Once standing, does the patient have the ability to ambulate? Yes  Have all appropriate therapeutic modalities to enable the patient to transfer from a chair to a standing position (e.g., medication, physical therapy) been tried and failed? Yes If YES, this is documented in the patient's medical record? Yes, and the patient will have PT follow up again.     EST. LENGTH OF NEED (# OF MONTHS): _99   1-99 (99=LIFETIME)   DIAGNOSIS CODES: Z91.81, M19.90          Personal Strengths Active sense of humor Communication skills Motivation for treatment/growth Supportive family/friends  Support System Strengths Supportive Relationships, Family, Hopefulness, Self Advocate and Able to Communicate Effectively  Contact: : [name]   [phone] (home)

## 2016-06-05 NOTE — Progress Notes (Signed)
I have interviewed and examined the patient.  I have discussed the case and verified the key findings with Dr. Lorenso Courier.   I agree with their assessments and plans as documented in their home visit note.   60 minutes face to face where spent in total with counseling / coordination of care took more than 50% of the total time. Counseling involved discussion of the multiple cognitive tests and function test results with patient and her dgt.

## 2016-06-16 ENCOUNTER — Telehealth: Payer: Self-pay | Admitting: *Deleted

## 2016-06-16 DIAGNOSIS — M546 Pain in thoracic spine: Secondary | ICD-10-CM

## 2016-06-16 NOTE — Telephone Encounter (Signed)
Pt daughter came into clinic today to be seen and stated that pt needed a refill on her pain medication and she wanted to know if this could be done before she leaves.  I told pt pcp was not in clinic today and that I could send her a message and let her know.  I told her that if she had called earlier it may could have been filled because PCP was in the clinic this morning.  Forwarding to PCP. Katharina Caper, April D, [place]

## 2016-06-19 ENCOUNTER — Telehealth: Payer: Self-pay | Admitting: Family Medicine

## 2016-06-19 DIAGNOSIS — H547 Unspecified visual loss: Secondary | ICD-10-CM

## 2016-06-19 MED ORDER — TRAMADOL HCL 50 MG PO TABS: 50.0000 mg | ORAL_TABLET | Freq: Three times a day (TID) | ORAL | 0 refills | Status: DC | PRN

## 2016-06-19 NOTE — Telephone Encounter (Signed)
Please let the patient know that tramadol was called in to her [facility].  Thanks, [name], MD [facility] Resident  06/19/2016, 8:42 AM

## 2016-06-19 NOTE — Telephone Encounter (Signed)
Patient does not need a referral to ophthalmology but I went ahead and sent in an order.  Thanks, [name], MD [facility] Resident  06/19/2016, 5:36 PM

## 2016-06-19 NOTE — Telephone Encounter (Signed)
Pt informed. Deseree Blount, CMA  

## 2016-06-19 NOTE — Telephone Encounter (Signed)
Patient daughter states this was briefly discussed at last visit, no referral in chart will forward to MD

## 2016-06-19 NOTE — Telephone Encounter (Signed)
Pt's daughter called wanting to know why they have not received referral to the opthalmologist. Please advise. Thanks! ep  [phone] [employer] [phone] [facility]

## 2016-07-04 ENCOUNTER — Ambulatory Visit (INDEPENDENT_AMBULATORY_CARE_PROVIDER_SITE_OTHER): Payer: Medicare Other | Admitting: Podiatry

## 2016-07-04 ENCOUNTER — Encounter: Payer: Self-pay | Admitting: Podiatry

## 2016-07-04 DIAGNOSIS — B351 Tinea unguium: Secondary | ICD-10-CM | POA: Diagnosis not present

## 2016-07-04 DIAGNOSIS — M79676 Pain in unspecified toe(s): Secondary | ICD-10-CM | POA: Diagnosis not present

## 2016-07-04 IMAGING — CR DG PELVIS 1-2V
1 series · 1 of 1 positions shown · non-contrast
Comparison: 01/25/2014

CLINICAL DATA: Pain after falling 2 days ago

EXAM:
PELVIS - 1-2 VIEW

[pelvis ap]
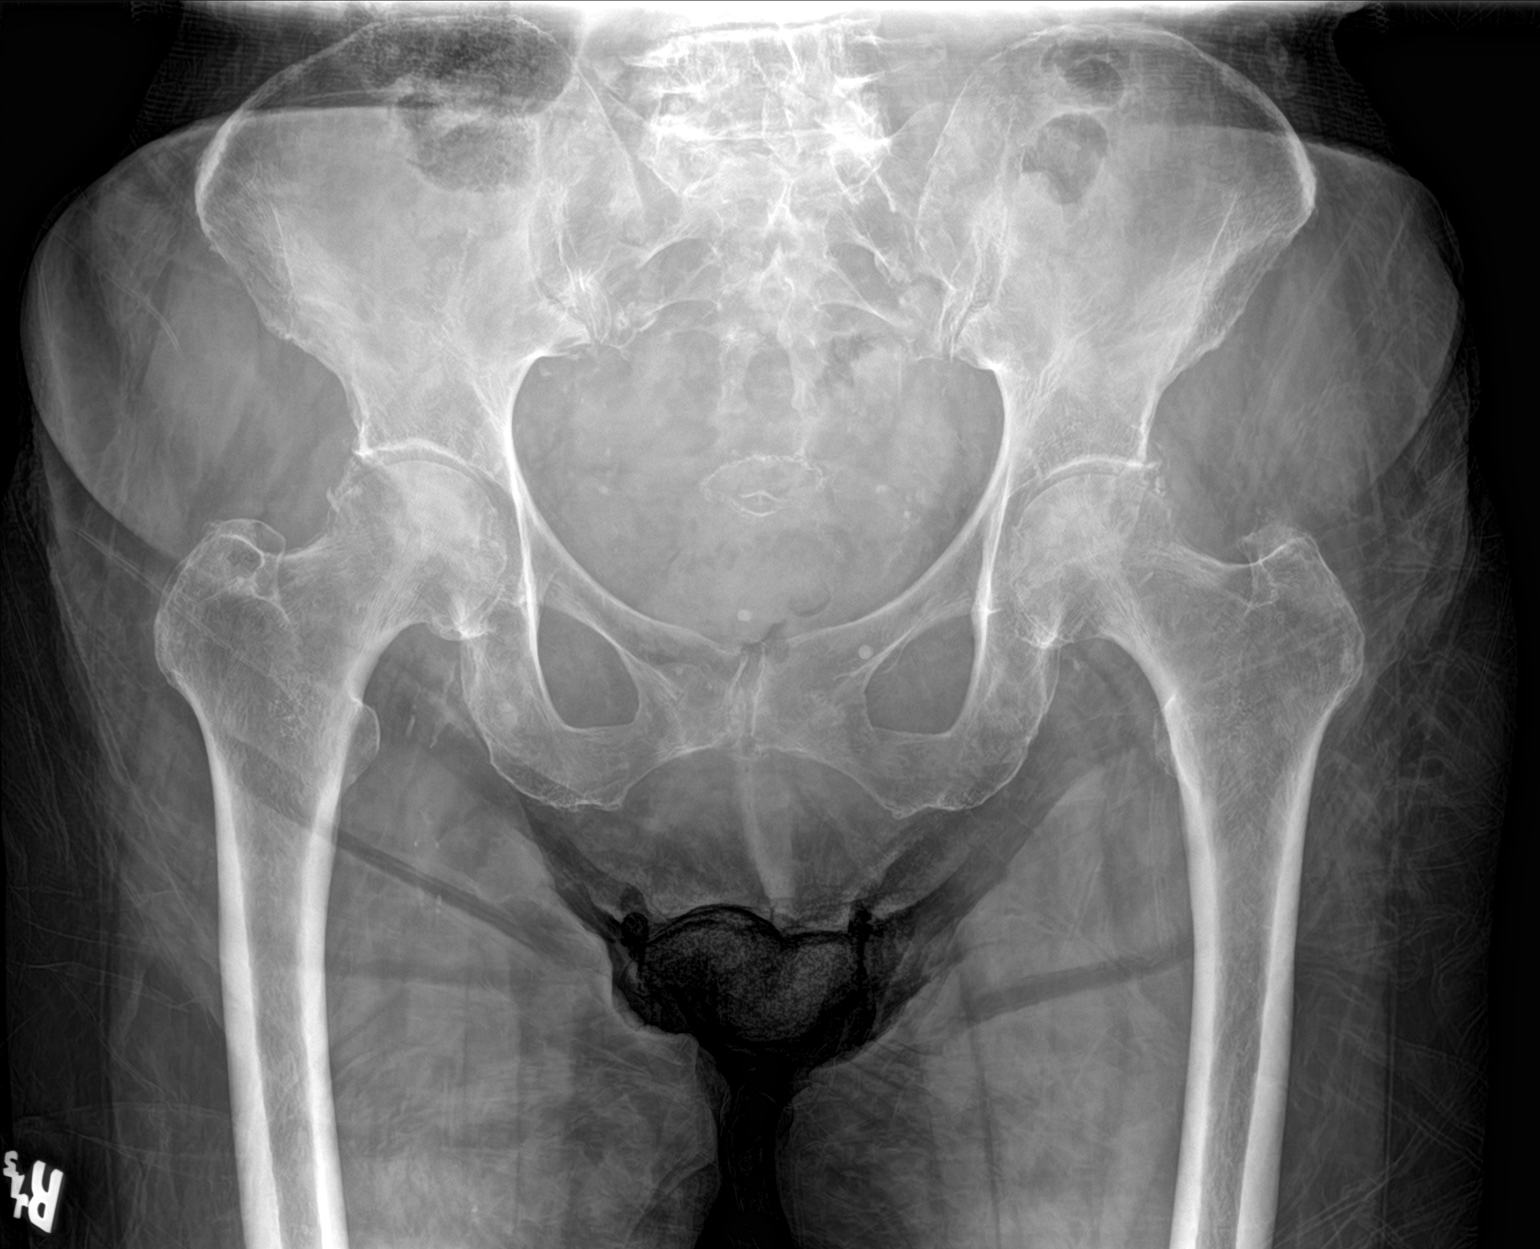

[1 of 1 positions shown; findings below may reference images not displayed]

FINDINGS: Negative for acute fracture or dislocation. Pubic symphysis and
sacroiliac joints appear intact. Moderate arthritic changes are
present about both hips. No bone lesion or bony destruction.
IMPRESSION: Negative for acute fracture.

## 2016-07-04 IMAGING — CR DG SCAPULA*R*
2 series · 2 of 2 positions shown · non-contrast
Comparison: None.

CLINICAL DATA: Pain after falling 2 days ago.

EXAM:
RIGHT SCAPULA - 2+ VIEWS

[scapula ap]
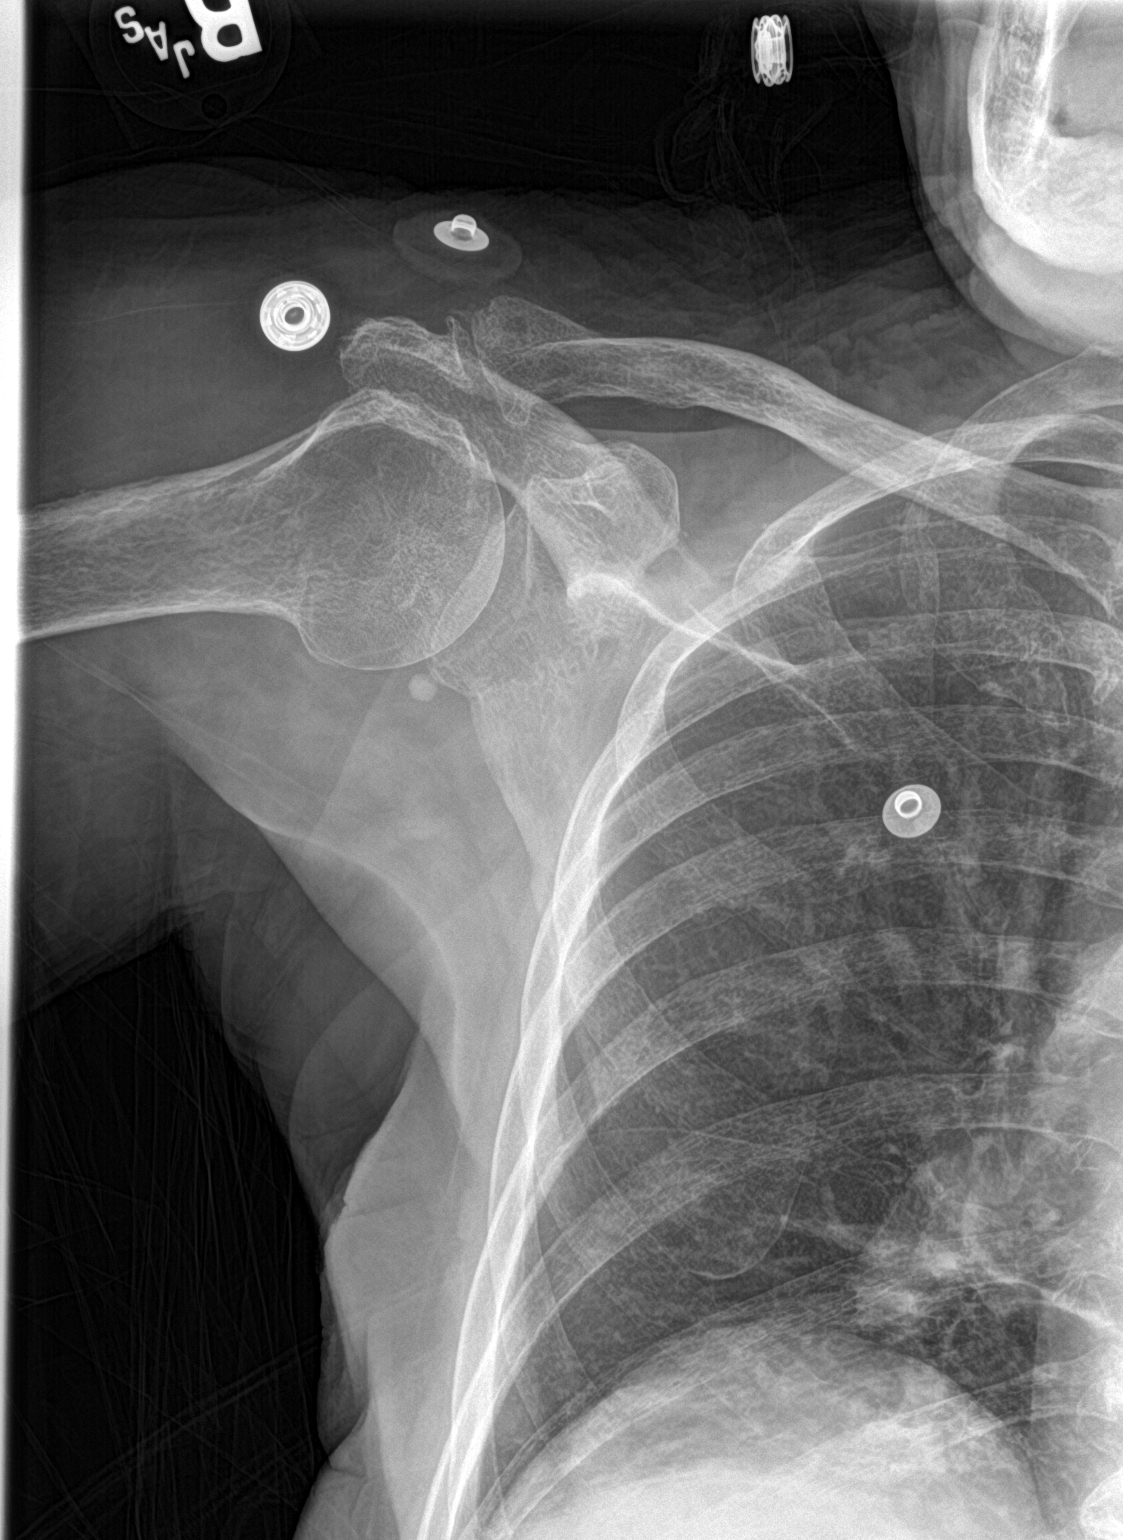

[scapula lat]
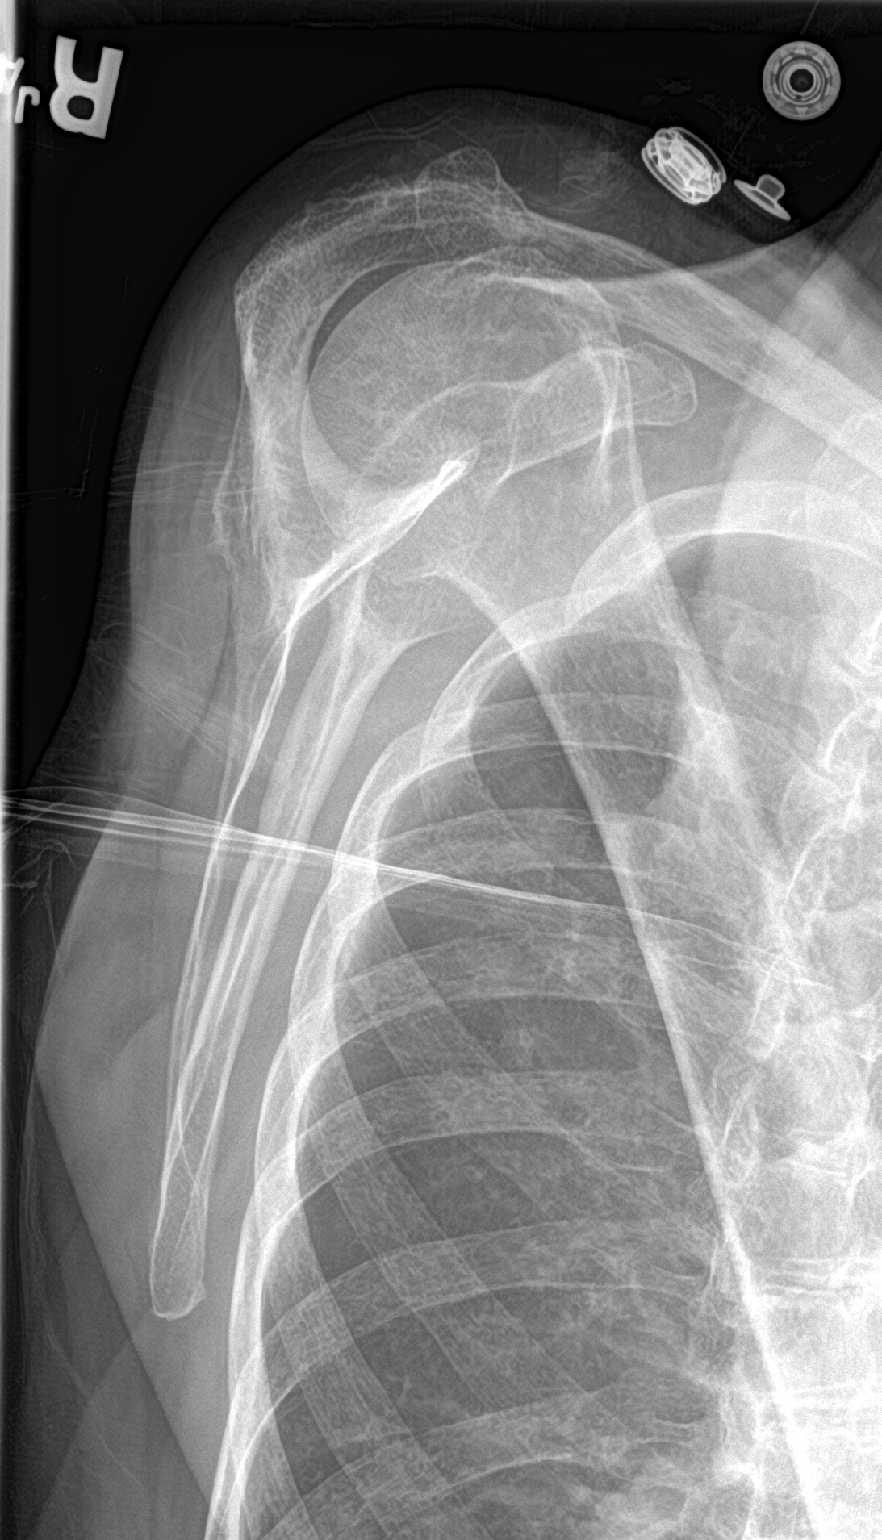

[2 of 2 positions shown; findings below may reference images not displayed]

FINDINGS: Negative for acute fracture or dislocation. Moderate degenerative AC
joint changes are present with prominent inferior spurring. No bone
lesion or bony destruction.
IMPRESSION: Negative for acute fracture.

## 2016-07-04 IMAGING — CR DG LUMBAR SPINE COMPLETE 4+V
5 series · 5 of 5 positions shown · non-contrast
Comparison: 01/25/2014

CLINICAL DATA: Pain after falling 2 days ago

EXAM:
LUMBAR SPINE - COMPLETE 4+ VIEW

[l-spine ap]
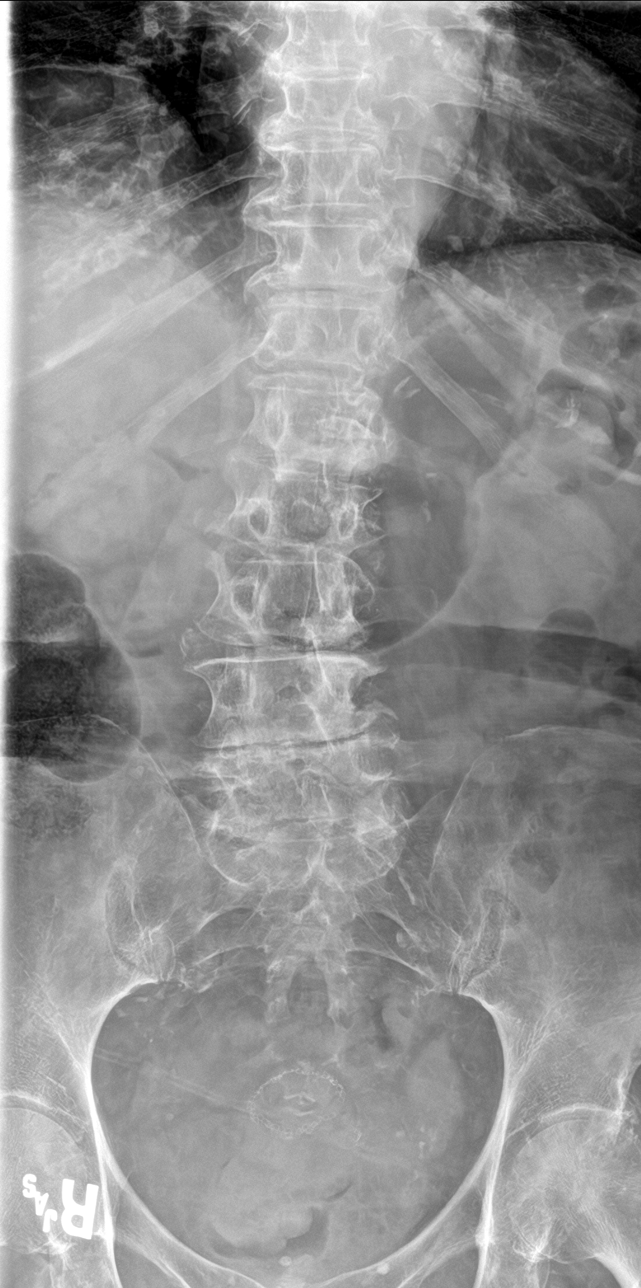

[l-spine obl (1 of 2)]
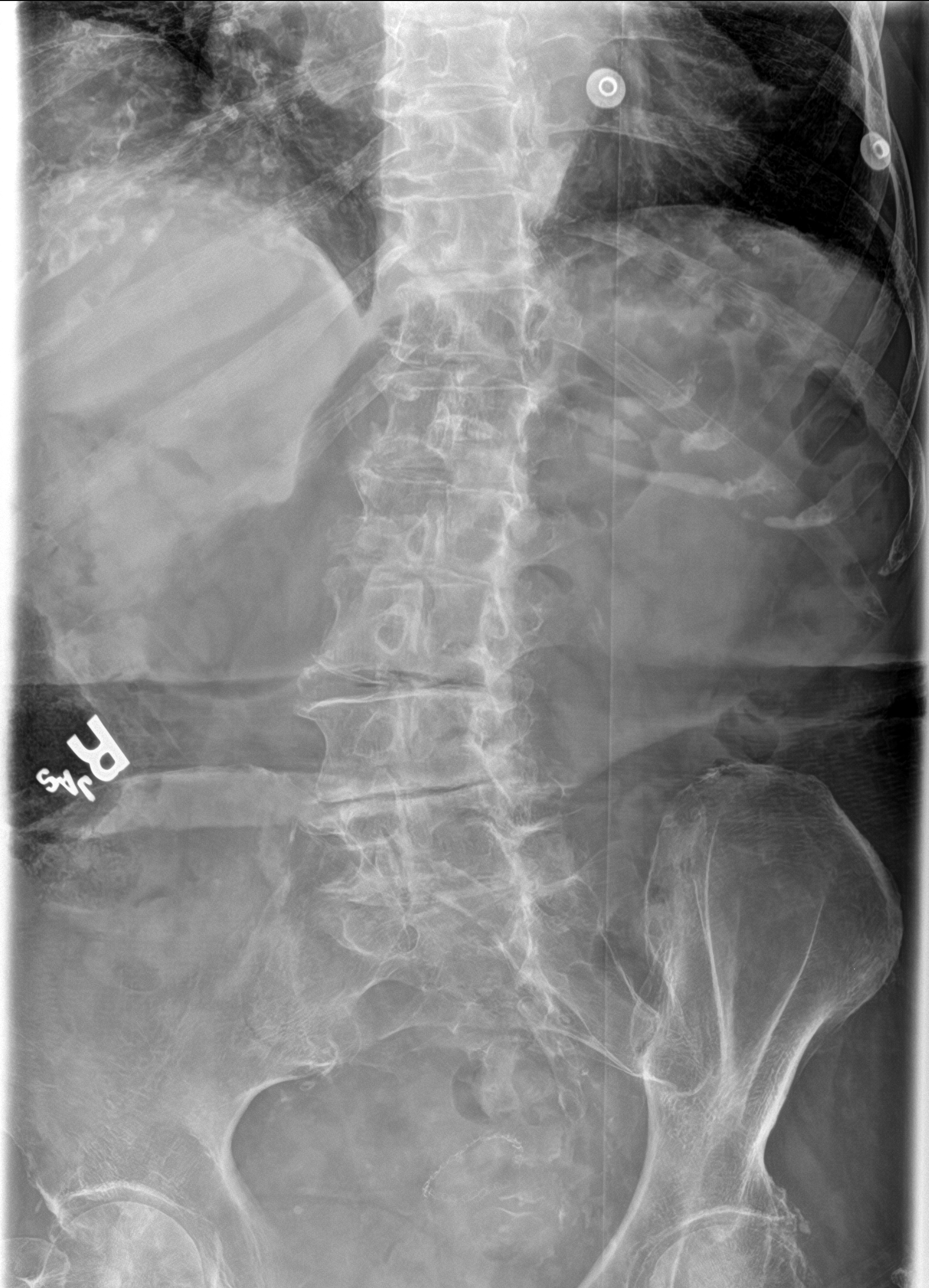

[l-spine obl (2 of 2)]
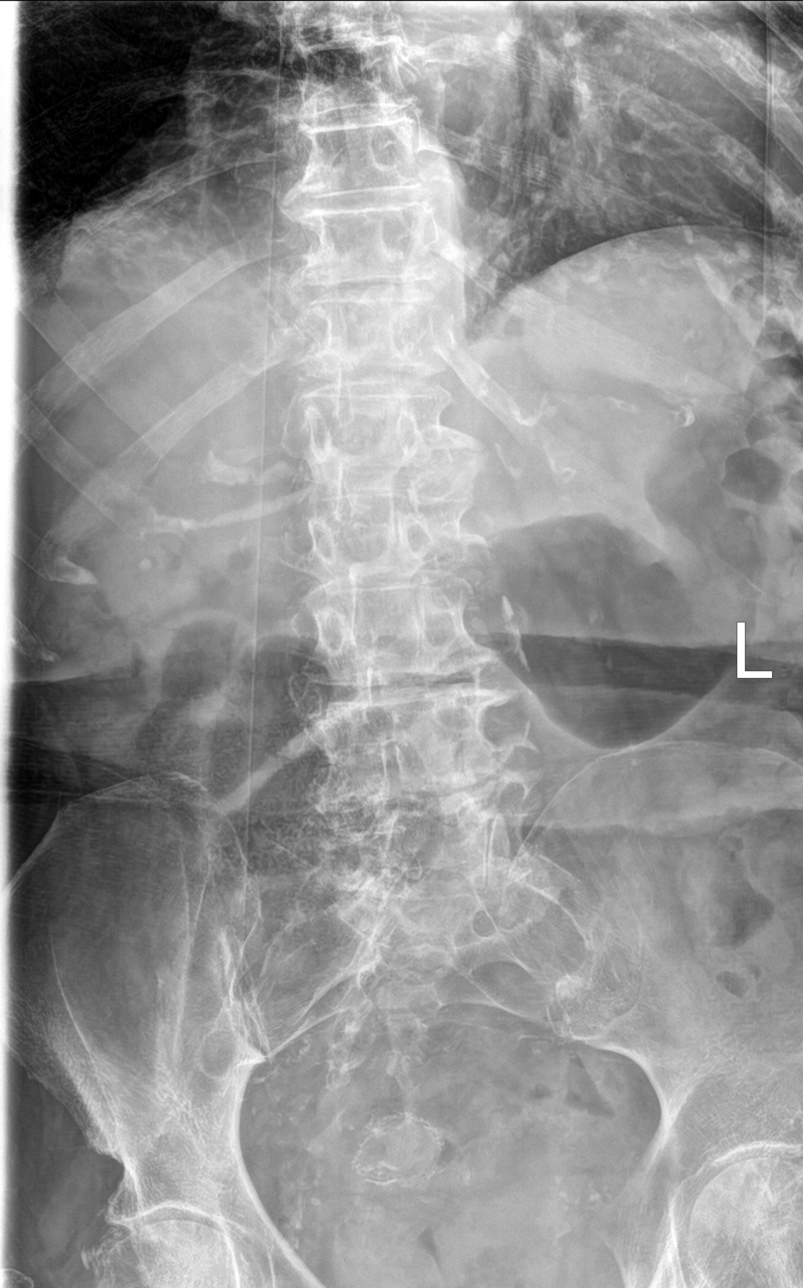

[l-spine lat]
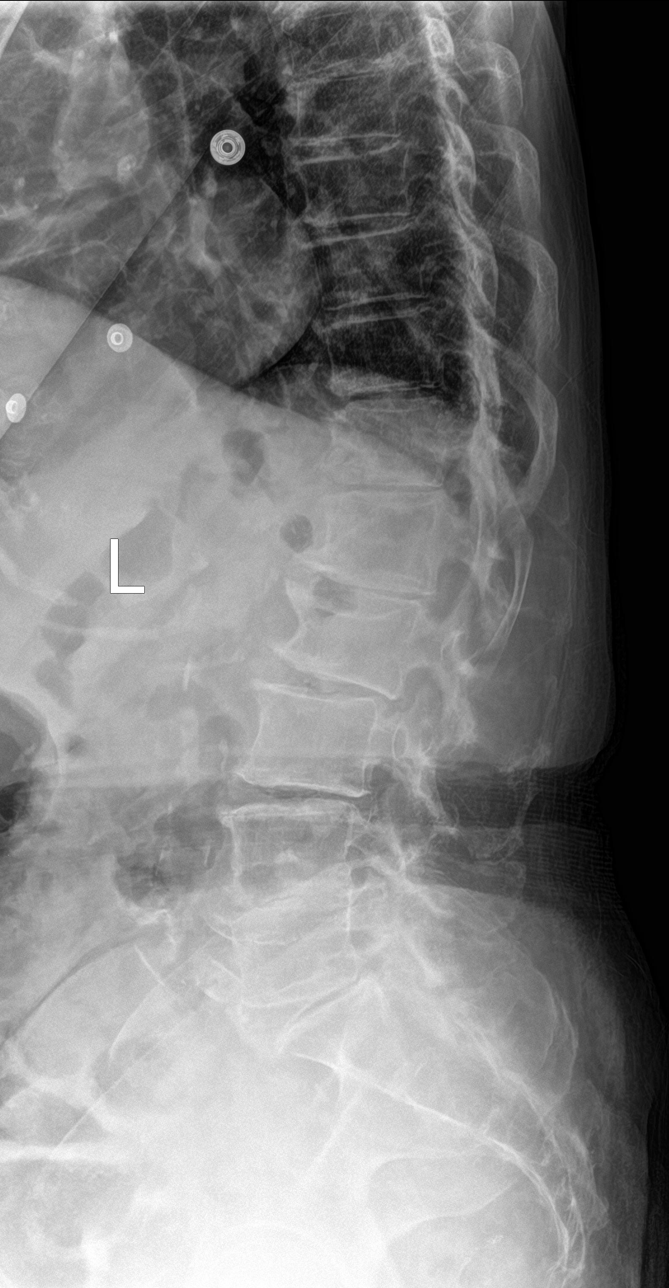

[l-spine spot]
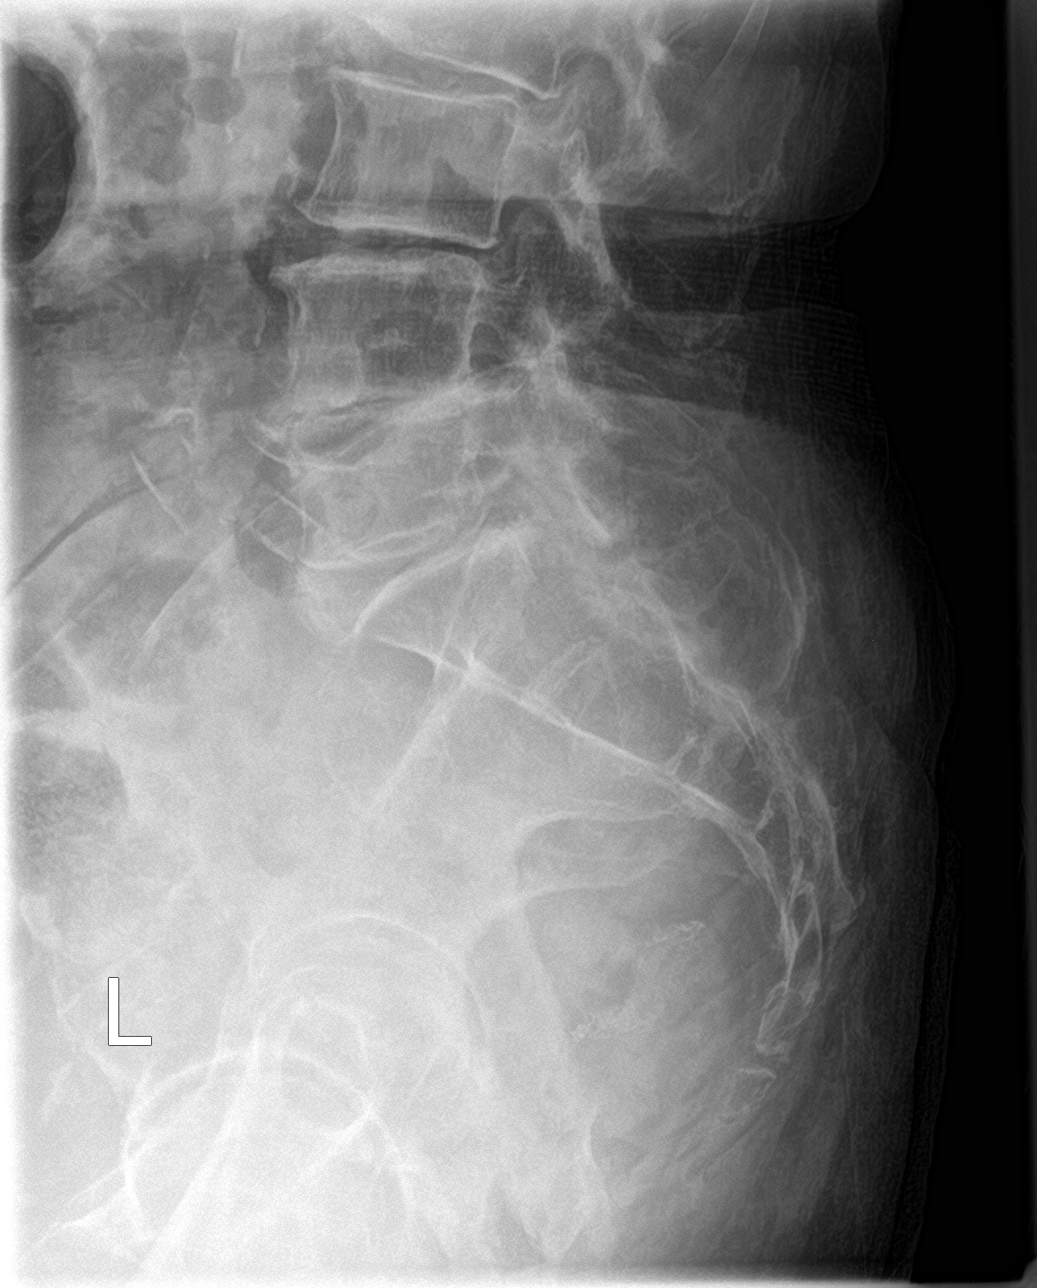

[5 of 5 positions shown; findings below may reference images not displayed]

FINDINGS: There is moderate right convex curvature at L3-4. There is stable
mild anterior wedge deformity of L2. The other lumbar vertebrae are
normal in height. There is moderately severe degenerative disc and
facet disease from L2 through the sacrum. No evidence of an acute
lumbar spine fracture. Sacroiliac joints appear intact.
IMPRESSION: Curvature and moderately severe degenerative changes. Negative for
acute lumbar spine fracture. Stable chronic mild compression
deformity of L2.

## 2016-07-04 NOTE — Progress Notes (Signed)
Patient ID: [name], female   DOB: [birth year], 82 y.o.   MRN: [id number]    Subjective: This patient presents again complaining of thickened and elongated toenails walking wearing shoes and requests toenail debridement. Patient's caregiver is present to treatment room  Objective: Orientated 3 No open skin lesions bilaterally The toenails are hypertrophic, elongated, deformed, discolored and tender direct palpation 6-10  Assessment: Symptomatic onychomycoses 6-10 Type II diabetic Callus formation plantar lateral right heel (no debridement necessary)  Plan: Debridement toenails 6-10 mechanically and electronically with slight bleeding distal second left toe, which was treated with antibiotic ointment and Band-Aid Patient advised to remove Band-Aids 1-3 days and apply topical antibiotic ointment daily until a scab forms Advised patient and patient's caregiver to place pillow under right calf to elevate pressure off the right heel  Reappoint 3 months

## 2016-07-04 NOTE — Patient Instructions (Signed)
Remove the Band-Aid on the second left toe and 1-3 days and apply topical antibiotic ointment and a Band-Aid daily until a scab forms  Diabetes and Foot Care Diabetes may cause you to have problems because of poor blood supply (circulation) to your feet and legs. This may cause the skin on your feet to become thinner, break easier, and heal more slowly. Your skin may become dry, and the skin may peel and crack. You may also have nerve damage in your legs and feet causing decreased feeling in them. You may not notice minor injuries to your feet that could lead to infections or more serious problems. Taking care of your feet is one of the most important things you can do for yourself.  HOME CARE INSTRUCTIONS  Wear shoes at all times, even in the house. Do not go barefoot. Bare feet are easily injured.  Check your feet daily for blisters, cuts, and redness. If you cannot see the bottom of your feet, use a mirror or ask someone for help.  Wash your feet with warm water (do not use hot water) and mild soap. Then pat your feet and the areas between your toes until they are completely dry. Do not soak your feet as this can dry your skin.  Apply a moisturizing lotion or petroleum jelly (that does not contain alcohol and is unscented) to the skin on your feet and to dry, brittle toenails. Do not apply lotion between your toes.  Trim your toenails straight across. Do not dig under them or around the cuticle. File the edges of your nails with an emery board or nail file.  Do not cut corns or calluses or try to remove them with medicine.  Wear clean socks or stockings every day. Make sure they are not too tight. Do not wear knee-high stockings since they may decrease blood flow to your legs.  Wear shoes that fit properly and have enough cushioning. To break in new shoes, wear them for just a few hours a day. This prevents you from injuring your feet. Always look in your shoes before you put them on to be  sure there are no objects inside.  Do not cross your legs. This may decrease the blood flow to your feet.  If you find a minor scrape, cut, or break in the skin on your feet, keep it and the skin around it clean and dry. These areas may be cleansed with mild soap and water. Do not cleanse the area with peroxide, alcohol, or iodine.  When you remove an adhesive bandage, be sure not to damage the skin around it.  If you have a wound, look at it several times a day to make sure it is healing.  Do not use heating pads or hot water bottles. They may burn your skin. If you have lost feeling in your feet or legs, you may not know it is happening until it is too late.  Make sure your health care provider performs a complete foot exam at least annually or more often if you have foot problems. Report any cuts, sores, or bruises to your health care provider immediately. SEEK MEDICAL CARE IF:   You have an injury that is not healing.  You have cuts or breaks in the skin.  You have an ingrown nail.  You notice redness on your legs or feet.  You feel burning or tingling in your legs or feet.  You have pain or cramps in your legs and  feet.  Your legs or feet are numb.  Your feet always feel cold. SEEK IMMEDIATE MEDICAL CARE IF:   There is increasing redness, swelling, or pain in or around a wound.  There is a red line that goes up your leg.  Pus is coming from a wound.  You develop a fever or as directed by your health care provider.  You notice a bad smell coming from an ulcer or wound.   This information is not intended to replace advice given to you by your health care provider. Make sure you discuss any questions you have with your health care provider.   Document Released: 10/27/2000 Document Revised: 07/02/2013 Document Reviewed: 04/08/2013 Elsevier Interactive Patient Education [employer].

## 2016-07-24 ENCOUNTER — Other Ambulatory Visit: Payer: Self-pay | Admitting: Family Medicine

## 2016-07-31 ENCOUNTER — Telehealth: Payer: Self-pay | Admitting: Family Medicine

## 2016-07-31 DIAGNOSIS — H353134 Nonexudative age-related macular degeneration, bilateral, advanced atrophic with subfoveal involvement: Secondary | ICD-10-CM | POA: Diagnosis not present

## 2016-07-31 NOTE — Telephone Encounter (Signed)
pls ask the pt to make an appt with one of the providers to see if a CXR is appropriate.   Thanks, [employer]

## 2016-07-31 NOTE — Telephone Encounter (Signed)
Would like to have chest xray because pt is having congested coughing with back pain and right side pain.  No fever. She is having chills.  Please advise

## 2016-08-01 NOTE — Telephone Encounter (Signed)
Spoke with pt and her daughter and they no longer want the chest xray. Deseree Kennon Holter, CMA

## 2016-08-10 ENCOUNTER — Other Ambulatory Visit: Payer: Self-pay | Admitting: Family Medicine

## 2016-08-10 DIAGNOSIS — M546 Pain in thoracic spine: Secondary | ICD-10-CM

## 2016-08-10 NOTE — Telephone Encounter (Signed)
Daughter is calling and would like [place] to fax over a refill of her mother's Tramadol to the pharmacy. jw

## 2016-08-11 MED ORDER — TRAMADOL HCL 50 MG PO TABS: 50.0000 mg | ORAL_TABLET | Freq: Three times a day (TID) | ORAL | 0 refills | Status: DC | PRN

## 2016-08-11 NOTE — Telephone Encounter (Signed)
Refilled tramadol, its at the pharmacy.  [name], MD [facility] Resident  08/11/2016, 8:46 AM

## 2016-08-15 ENCOUNTER — Ambulatory Visit (INDEPENDENT_AMBULATORY_CARE_PROVIDER_SITE_OTHER): Payer: Medicare Other | Admitting: Family Medicine

## 2016-08-15 ENCOUNTER — Encounter: Payer: Self-pay | Admitting: Family Medicine

## 2016-08-15 VITALS — BP 148/82 | HR 91 | Temp 98.3°F | Ht 59.0 in | Wt 112.0 lb

## 2016-08-15 DIAGNOSIS — E119 Type 2 diabetes mellitus without complications: Secondary | ICD-10-CM

## 2016-08-15 DIAGNOSIS — I10 Essential (primary) hypertension: Secondary | ICD-10-CM

## 2016-08-15 DIAGNOSIS — F03A0 Unspecified dementia, mild, without behavioral disturbance, psychotic disturbance, mood disturbance, and anxiety: Secondary | ICD-10-CM

## 2016-08-15 DIAGNOSIS — Z23 Encounter for immunization: Secondary | ICD-10-CM

## 2016-08-15 DIAGNOSIS — F0390 Unspecified dementia without behavioral disturbance: Secondary | ICD-10-CM | POA: Diagnosis not present

## 2016-08-15 DIAGNOSIS — R112 Nausea with vomiting, unspecified: Secondary | ICD-10-CM | POA: Diagnosis present

## 2016-08-15 LAB — POCT GLYCOSYLATED HEMOGLOBIN (HGB A1C): Hemoglobin A1C: 8.8

## 2016-08-15 MED ORDER — GUAIFENESIN 100 MG/5ML PO SYRP: 200.0000 mg | ORAL_SOLUTION | Freq: Three times a day (TID) | ORAL | 0 refills | Status: DC | PRN

## 2016-08-15 NOTE — Progress Notes (Signed)
Subjective: CC: f/u DM HPI: Patient is a 82 y.o. female with a past medical history of HTN, mild dementia, chronic systolic heart failure, PAD, history of falls, acid reflux, type 2 diabetes, breast cancer status post bilateral masectomy, and colon cancer presenting to clinic today for a f/u on DM.  She is accompanied by her son, Audry Pili, who I have never meet before.  She feels that "all bones in her body hurt" due to rubbing on each other for the last week. [name], her daughter and caregiver,  doesn't want her to take tramadol due to concerns for constipation per the patients report.  [name] doesn't want her taking laxatives either, and latera Ms. Louissaint wonders if it is because she may have an accident.  She's having difficult getting around with the walker in the home. She also notes that she goes hungry. She notes that she eats breakfast, but then [employer] dinner until 7 or 8pm. She notes she does not eat during that time between. She notes that [name] does not want her roaming in the kitchen. She just feels [name] is not used to taking care of "old people." she hasnt had any more falls.   She's talked to her children about going into a nursing home however her children are in a disagreement. They feel people get mistreated in nursing homes.  I called and spoke with [name] who notes that the patients wants to eat cake and milk or ice cream or milk during the day and that she gives her whatever she wants. She does note the patient may be slightly more forgetful then previously. She denies falls. She notes the bedside commode, etc from our last visit have not been delivered.   HTN: stable. No side effects from medicines. No chest pain or SOB. No vision change.   Social History: continues to use snuff.   Health Maintenance: Due for Zostavax, TDap, flu vaccine,  foot exam, and eye exam  ROS: All other systems reviewed and are negative.  Past Medical History Patient Active Problem List    Diagnosis Date Noted  . Mild dementia 06/01/2016  . Depression 01/20/2016  . Suicidal ideation 01/19/2016  . Prolapse of female pelvic organs 12/28/2015  . Leg weakness, bilateral 12/08/2015  . Weakness of right lower extremity 11/25/2015  . (HFpEF) heart failure with preserved ejection fraction ([place])   . CAD in native artery   . Gout 05/14/2015  . Dyslipidemia   . Essential hypertension   . Seasonal allergies 02/10/2015  . Stroke-like symptoms   . Stroke-like episode ([place]) 01/25/2015  . Syncope and collapse 01/25/2015  . Anemia of chronic illness 08/19/2014  . Acute superficial venous thrombosis of right lower extremity 08/19/2014  . Hypercalcemia 06/09/2014  . Breast cancer, left breast ([place]) 06/09/2014  . Breast cancer, right breast ([place]) 06/09/2014  . Edema of left lower extremity 03/30/2014  . PAD (peripheral artery disease) ([place]) 03/30/2014  . History of colon cancer 12/11/2013  . History of fall 05/15/2013  . Bilateral leg pain 03/07/2013  . Lower extremity edema 02/19/2013  . Stricture and stenosis of esophagus 10/27/2012  . Thoracic back pain 05/24/2012  . Tobacco abuse 06/02/2011  . History of breast cancer in female, bilateral.  Mastectomies 10/24/2010. 04/26/2011  . Hiatal hernia 03/22/2011  . Colon polyp 03/22/2011  . Poor circulation 03/22/2011  . 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 NEUROPATHY 08/25/2009  . ANEMIA-IRON DEFICIENCY 04/29/2007  . HLD (hyperlipidemia) 01/29/2007  . Diabetes type  2, controlled ([place]) 01/10/2007  . HYPERTENSION, BENIGN SYSTEMIC 01/10/2007  . Coronary atherosclerosis 01/10/2007  . Chronic systolic heart failure ([place]) 01/10/2007  . Asthma 01/10/2007  . Reflux esophagitis 01/10/2007  . Osteoarthritis, multiple sites 01/10/2007    Medications- reviewed and updated  Objective: Office vital signs reviewed. BP (!) 148/82   Pulse 91   Temp 98.3 F (36.8 C) (Oral)   Ht 4\' 11"  (1.499 m)   Wt 112 lb (50.8 kg)   BMI  22.62 kg/m    Physical Examination:  General: Awake, alert, well- nourished, NAD ENMT:  TMs intact, normal light reflex, no erythema, no bulging. Nasal turbinates moist. MMM, Oropharynx clear without erythema or tonsillar exudate/hypertrophy Eyes: Conjunctiva non-injected. PERRL.  Cardio: RRR, 2/6 systolic murmur. No rubs or gallops. No pitting edema.  Pulm: No increased WOB.  CTAB, without wheezes, rhonchi or crackles noted.  Extremities: no open lesions on the feet, callous on lateral/plantar aspect of R heel,  normal to monofilament exam on the left, no sensation on plantar aspect of R foot; +1 pulses bilaterally MSK: slow gait with walker. Tenderness diffusely over the thoracic and lumbar spine and paraspinal processes.  Skin: dry, intact, no rashes or lesions  A1c 8.8   Assessment/Plan: Essential hypertension BP near goal given age. I would not want to drop her BPs too much given her h/o falls and age.  - continue current regimen.    Diabetes type 2, controlled (HCC) A1c 8.8, stable currently. My goal for her given her age is around 8.5. Will hold off on starting metformin today.  - discussed that she's due for an eye exam  - foot exam today- decreased sensation on plantar aspect of R foot. Sees podiatry. Discussed regular self foot exams for lesions. - f/u in 3 months  NEUROPATHY Continue gabapentin   Osteoarthritis, multiple sites Patient noted "pain all over." notes that tramadol clears her pain completely. She's had capsaicin cream in the past with significant improvement in her symptoms. She notes pain in back is stable over years without new symptoms. No red flags in history- new change in continence, saddle paresthesias, or unilateral weakness. Has had recent imaging of the thoracic and lumbar region without acute fracture or evidence of metastasis given h/o cancer. - refilled capsaicin cream  - advised to take Tylenol PRN - pt has tramadol PRN break through pain.  Mild  dementia Patient screened at our last home visit with concerns for mild dementia. I am curious whether she is being fed during they daytime as [name] says and is forgetting. At this time her weight is stable. No evidence of bruising or open lesions on the exposed skin, abdomen, or back noted to suggest elder abuse or neglect.  - continue to monitor - provided patient paperwork on information about seeking a long term nursing facility. Discussed with CSW. Pt would need to apply for Medicaid to get the process started. Patient notes she will talk with her family and think about transitioning to a nursing home (she currently requires a home aid to bath her and assistance with dressing).  - pt to f/u with me in 1 month or sooner as needed.    Orders Placed This Encounter  Procedures  . Flu Vaccine QUAD 36+ mos IM  . POCT HgB A1C (CPT 83036)    Meds ordered this encounter  Medications  . guaifenesin (ROBITUSSIN) 100 MG/5ML syrup    Sig: Take 10 mLs (200 mg total) by mouth 3 (three) times daily  as needed for cough.    Dispense:  120 mL    Refill:  [facility] PGY-3, [place]

## 2016-08-15 NOTE — Patient Instructions (Addendum)
Your blood sugar looks good today. Continue with your current blood pressure regimen I have prescribed the capsaicin cream to help with back pain.  Make sure you voice to your daughter when you're hungry. If you and your family decide that a nursing home is a good fit, you'd need to apply for Medicaid as Medicare does not cover long term nursing homes.

## 2016-08-17 NOTE — Assessment & Plan Note (Signed)
A1c 8.8, stable currently. My goal for her given her age is around 8.5. Will hold off on starting metformin today.  - discussed that she's due for an eye exam  - foot exam today- decreased sensation on plantar aspect of R foot. Sees podiatry. Discussed regular self foot exams for lesions. - f/u in 3 months

## 2016-08-17 NOTE — Assessment & Plan Note (Signed)
Patient noted "pain all over." notes that tramadol clears her pain completely. She's had capsaicin cream in the past with significant improvement in her symptoms. She notes pain in back is stable over years without new symptoms. No red flags in history- new change in continence, saddle paresthesias, or unilateral weakness. Has had recent imaging of the thoracic and lumbar region without acute fracture or evidence of metastasis given h/o cancer. - refilled capsaicin cream  - advised to take Tylenol PRN - pt has tramadol PRN break through pain.

## 2016-08-17 NOTE — Assessment & Plan Note (Signed)
Patient screened at our last home visit with concerns for mild dementia. I am curious whether she is being fed during they daytime as [name] says and is forgetting. At this time her weight is stable. No evidence of bruising or open lesions on the exposed skin, abdomen, or back noted to suggest elder abuse or neglect.  - continue to monitor - provided patient paperwork on information about seeking a long term nursing facility. Discussed with CSW. Pt would need to apply for Medicaid to get the process started. Patient notes she will talk with her family and think about transitioning to a nursing home (she currently requires a home aid to bath her and assistance with dressing).  - pt to f/u with me in 1 month or sooner as needed.

## 2016-08-17 NOTE — Assessment & Plan Note (Signed)
BP near goal given age. I would not want to drop her BPs too much given her h/o falls and age.  - continue current regimen.

## 2016-08-17 NOTE — Assessment & Plan Note (Signed)
Continue gabapentin.

## 2016-08-24 ENCOUNTER — Telehealth: Payer: Self-pay | Admitting: Licensed Clinical Social Worker

## 2016-08-24 ENCOUNTER — Other Ambulatory Visit: Payer: Self-pay

## 2016-08-24 ENCOUNTER — Telehealth: Payer: Self-pay

## 2016-08-24 ENCOUNTER — Emergency Department (HOSPITAL_COMMUNITY): Payer: Medicare Other

## 2016-08-24 ENCOUNTER — Encounter (HOSPITAL_COMMUNITY): Payer: Self-pay | Admitting: Emergency Medicine

## 2016-08-24 ENCOUNTER — Emergency Department (HOSPITAL_COMMUNITY)
Admission: EM | Admit: 2016-08-24 | Discharge: 2016-08-24 | Disposition: A | Discharge status: Home / Self Care | Payer: Medicare Other | Attending: Emergency Medicine | Admitting: Emergency Medicine

## 2016-08-24 DIAGNOSIS — Z79899 Other long term (current) drug therapy: Secondary | ICD-10-CM | POA: Diagnosis not present

## 2016-08-24 DIAGNOSIS — Z7409 Other reduced mobility: Secondary | ICD-10-CM | POA: Diagnosis not present

## 2016-08-24 DIAGNOSIS — I5022 Chronic systolic (congestive) heart failure: Secondary | ICD-10-CM | POA: Diagnosis not present

## 2016-08-24 DIAGNOSIS — Z85038 Personal history of other malignant neoplasm of large intestine: Secondary | ICD-10-CM | POA: Diagnosis not present

## 2016-08-24 DIAGNOSIS — I2510 Atherosclerotic heart disease of native coronary artery without angina pectoris: Secondary | ICD-10-CM | POA: Insufficient documentation

## 2016-08-24 DIAGNOSIS — Z7982 Long term (current) use of aspirin: Secondary | ICD-10-CM | POA: Diagnosis not present

## 2016-08-24 DIAGNOSIS — J181 Lobar pneumonia, unspecified organism: Secondary | ICD-10-CM | POA: Diagnosis not present

## 2016-08-24 DIAGNOSIS — E119 Type 2 diabetes mellitus without complications: Secondary | ICD-10-CM | POA: Diagnosis not present

## 2016-08-24 DIAGNOSIS — M79671 Pain in right foot: Secondary | ICD-10-CM | POA: Diagnosis not present

## 2016-08-24 DIAGNOSIS — L89611 Pressure ulcer of right heel, stage 1: Secondary | ICD-10-CM | POA: Diagnosis not present

## 2016-08-24 DIAGNOSIS — I110 Hypertensive heart disease with heart failure: Secondary | ICD-10-CM | POA: Diagnosis not present

## 2016-08-24 DIAGNOSIS — F17210 Nicotine dependence, cigarettes, uncomplicated: Secondary | ICD-10-CM | POA: Insufficient documentation

## 2016-08-24 DIAGNOSIS — M25571 Pain in right ankle and joints of right foot: Secondary | ICD-10-CM | POA: Diagnosis present

## 2016-08-24 DIAGNOSIS — J189 Pneumonia, unspecified organism: Secondary | ICD-10-CM

## 2016-08-24 DIAGNOSIS — R030 Elevated blood-pressure reading, without diagnosis of hypertension: Secondary | ICD-10-CM | POA: Diagnosis not present

## 2016-08-24 DIAGNOSIS — Z853 Personal history of malignant neoplasm of breast: Secondary | ICD-10-CM | POA: Insufficient documentation

## 2016-08-24 DIAGNOSIS — R079 Chest pain, unspecified: Secondary | ICD-10-CM | POA: Diagnosis not present

## 2016-08-24 DIAGNOSIS — J45909 Unspecified asthma, uncomplicated: Secondary | ICD-10-CM | POA: Diagnosis not present

## 2016-08-24 DIAGNOSIS — M79673 Pain in unspecified foot: Secondary | ICD-10-CM | POA: Diagnosis not present

## 2016-08-24 DIAGNOSIS — E11622 Type 2 diabetes mellitus with other skin ulcer: Secondary | ICD-10-CM | POA: Diagnosis not present

## 2016-08-24 LAB — CBC WITH DIFFERENTIAL/PLATELET
BASOS ABS: 0 10*3/uL (ref 0.0–0.1)
Basophils Relative: 0 %
EOS ABS: 0.2 10*3/uL (ref 0.0–0.7)
Eosinophils Relative: 3 %
HCT: 32 % — ABNORMAL LOW (ref 36.0–46.0)
Hemoglobin: 10.5 g/dL — ABNORMAL LOW (ref 12.0–15.0)
LYMPHS ABS: 1.4 10*3/uL (ref 0.7–4.0)
Lymphocytes Relative: 28 %
MCH: 22.4 pg — ABNORMAL LOW (ref 26.0–34.0)
MCHC: 32.8 g/dL (ref 30.0–36.0)
MCV: 68.4 fL — ABNORMAL LOW (ref 78.0–100.0)
Monocytes Absolute: 0.4 10*3/uL (ref 0.1–1.0)
Monocytes Relative: 8 %
NEUTROS ABS: 3 10*3/uL (ref 1.7–7.7)
Neutrophils Relative %: 61 %
PLATELETS: 198 10*3/uL (ref 150–400)
RBC: 4.68 MIL/uL (ref 3.87–5.11)
RDW: 16.2 % — AB (ref 11.5–15.5)
WBC: 5 10*3/uL (ref 4.0–10.5)

## 2016-08-24 LAB — BASIC METABOLIC PANEL
ANION GAP: 7 (ref 5–15)
BUN: 14 mg/dL (ref 6–20)
CALCIUM: 10.6 mg/dL — AB (ref 8.9–10.3)
CO2: 26 mmol/L (ref 22–32)
CREATININE: 0.89 mg/dL (ref 0.44–1.00)
Chloride: 102 mmol/L (ref 101–111)
GFR, EST NON AFRICAN AMERICAN: 55 mL/min — AB (ref >60)
Glucose, Bld: 235 mg/dL — ABNORMAL HIGH (ref 65–99)
Potassium: 3.4 mmol/L — ABNORMAL LOW (ref 3.5–5.1)
SODIUM: 135 mmol/L (ref 135–145)

## 2016-08-24 LAB — I-STAT CG4 LACTIC ACID, ED
LACTIC ACID, VENOUS: 0.99 mmol/L (ref 0.5–1.9)
Lactic Acid, Venous: 0.98 mmol/L (ref 0.5–1.9)

## 2016-08-24 LAB — BRAIN NATRIURETIC PEPTIDE: B Natriuretic Peptide: 116.6 pg/mL — ABNORMAL HIGH (ref 0.0–100.0)

## 2016-08-24 MED ORDER — DOXYCYCLINE HYCLATE 100 MG PO CAPS: 100.0000 mg | ORAL_CAPSULE | Freq: Two times a day (BID) | ORAL | 0 refills | Status: DC

## 2016-08-24 MED ORDER — FENTANYL CITRATE (PF) 100 MCG/2ML IJ SOLN: 25.0000 ug | Freq: Once | INTRAMUSCULAR | Status: DC

## 2016-08-24 MED ORDER — DEXTROSE 5 % IV SOLN
1.0000 g | Freq: Once | INTRAVENOUS | Status: AC
  Administered 2016-08-24: 1 g via INTRAVENOUS
  Filled 2016-08-24: qty 10

## 2016-08-24 MED ORDER — DEXTROSE 5 % IV SOLN
500.0000 mg | Freq: Once | INTRAVENOUS | Status: AC
  Administered 2016-08-24: 500 mg via INTRAVENOUS
  Filled 2016-08-24: qty 500

## 2016-08-24 MED ORDER — SODIUM CHLORIDE 0.9 % IV BOLUS (SEPSIS): 1000.0000 mL | Freq: Once | INTRAVENOUS | Status: DC

## 2016-08-24 MED ORDER — SODIUM CHLORIDE 0.9 % IV BOLUS (SEPSIS)
500.0000 mL | Freq: Once | INTRAVENOUS | Status: AC
  Administered 2016-08-24: 500 mL via INTRAVENOUS

## 2016-08-24 NOTE — Progress Notes (Signed)
LCSW received voice message from [facility], indicating she was call for patient.  LCSW returned phone call.  No answer, unable to leave a message.  Indication in chart that patient is in the ED.    Casimer Lanius, LCSW Licensed Clinical Social Worker [place]   [phone] 3:24 PM

## 2016-08-24 NOTE — ED Notes (Signed)
Patient transported to X-ray 

## 2016-08-24 NOTE — ED Notes (Signed)
Bed: WA17 Expected date:  Expected time:  Means of arrival:  Comments: EMS-B/L leg pain

## 2016-08-24 NOTE — Progress Notes (Signed)
CSW spoke with patient and patients family regarding discharge via bedside. Patient is currently living at home with her daughter and is requesting placement. Patient has Medicare and Medicaid however will not need inpatient treatment. CSW discussed options to patient and patients family regarding Medicare and Medicaid guidelines. Patient would be willing to give up Medicaid check for long-term placement. CSW consulted [facility] for [facility] needs. CSW will provide patient list of skilled-nursing and assisted living facilities.   Kingsley Spittle, [place] Clinical Social Worker [phone]

## 2016-08-24 NOTE — ED Notes (Signed)
Social worker at bedside.

## 2016-08-24 NOTE — Care Management Note (Addendum)
Case Management Note  Patient Details  Name: [name] MRN: [id number] Date of Birth: 1924/10/19  Subjective/Objective:    Patient presents to ED with right ankle pain and fatigue                Action/Plan: Disussed home health services with patient and daughter in law at bedside.  Patient reports she has has [place] in the past and would like services with this agency again.  EDCM provided patient with list of home health agencies in [facility] and explained services.  EDCM also provided patient with printed information and contact information for [facility], [facility],  [facility] and private duty nursing agencies.  Patient reports she already has private duty nursing services with [facility] every day except Sun for 2-3 hours a day.  EDCM suggested calling [facility] and increasing hours if needed.  Patient expressed interest in 3 in 1 commode and walker.  [facility] provided patient with theses items.  Patient offered choice of dme agency, patient agreeable to [facility].  EDCM explained to patient that there may be a copay for these items.  Patient verbalized understanding.  Patient lives with her [name] who patient reports, "Has the sugar but she can dop whatever she wants."  Patient reports she has plenty support at home as she has had 8 girls and 5 boys, daughter in law confirms this.  Patient very thankful for services.  Discussed with EDP.  [facility] faxed [facility] health orders to [place] ([facility]) with confirmation of receipt.  No further EDCM needs at this time.  [name] patient's daughter with whom she lives [phone].   Expected Discharge Date:   (unknown)               Expected Discharge Plan:  [place]  In-House Referral:  Clinical Social Work  Discharge planning Services  CM Consult  Post Acute Care Choice:  Durable Medical Equipment, Home Health Choice offered to:  Patient, Adult Children  DME Arranged:  3-N-1, Walker rolling DME  Agency:  [place]:  RN, PT, OT, Nurse's Aide, Social Work [employer] Agency:  [employer] (now [facility])  Status of Service:  Completed, signed off  If discussed at [employer] [facility] [employer], dates discussed:    Additional Comments:  EDCM witnessed patient ambulating with walker with minimal assist.  Patient reports she likes her new walker.  Christen Bame, [place], RN 08/24/2016, 8:47 PM

## 2016-08-24 NOTE — ED Notes (Signed)
MD at bedside. 

## 2016-08-24 NOTE — ED Notes (Signed)
Changed brief

## 2016-08-24 NOTE — ED Provider Notes (Addendum)
[facility] DEPT Provider Note   CSN: [id number] Arrival date & time: 08/24/16  1338     History   Chief Complaint Chief Complaint  Patient presents with  . Foot Pain    Pressure ulcer right foot    HPI [name] is a 82 y.o. female.  HPI 82 year old female with extensive past medical history including CHF, coronary artery disease, hypertension, hyperlipidemia who presents with right ankle pain, cough, and general fatigue. The patient states that over the last week, she has had progressively worsening generalized fatigue. She is normally able to get around her house with a walker but has essentially not gotten out of bed for the last week. She states this is due to general weakness. She has also noticed intermittent chest pain and pressure as well as cough productive of "saliva." Over the last week, she has had increasing pain over a small, dark area of her right ankle and has become increasingly red and painful. She denies any trauma to the area. Family is concerned that she is unable to get around the house and she lives with her daughter, who is also chronically ill and unable to care for her. Patient states she feels extremely fatigued. She has a history of pneumonia and is concerned that she may have one again.  Past Medical History:  Diagnosis Date  . Allergy   . Anemia    Iron def anemia  . Arthritis   . Asthma   . Breast cancer ([place]) 10/2010   Invasive Ductal Carcinoma, s/p bilateral mastectomy, now on Tamoxifen. Followed by Dr Jamse Arn.   . Breast cancer ([place]) 2011   s/p Bilateral masectomy  . CAD (coronary artery disease)   . Carotid artery aneurysm ([place]) 08/2010   right ICA, 5 x 17m  . CHF (congestive heart failure) (HCC)    Systolic   . Colon cancer ([place]   . Colon cancer ([place] 12/11/2013  . DDD (degenerative disc disease), cervical   . Diabetes mellitus   . Diverticulosis   . GERD (gastroesophageal reflux disease)   . H/O: GI bleed   . Hiatal hernia   .  History of colon cancer   . Hypercalcemia 06/09/2014  . Hyperlipidemia   . Hypertension   . Myocardial infarction   . Neuropathy ([facility]   . Pneumonia 03/2012  . Shortness of breath     Patient Active Problem List   Diagnosis Date Noted  . Mild dementia 06/01/2016  . Depression 01/20/2016  . Suicidal ideation 01/19/2016  . Prolapse of female pelvic organs 12/28/2015  . Leg weakness, bilateral 12/08/2015  . Weakness of right lower extremity 11/25/2015  . (HFpEF) heart failure with preserved ejection fraction ([facility]   . CAD in native artery   . Gout 05/14/2015  . Dyslipidemia   . Essential hypertension   . Seasonal allergies 02/10/2015  . Stroke-like symptoms   . Stroke-like episode ([facility] 01/25/2015  . Syncope and collapse 01/25/2015  . Anemia of chronic illness 08/19/2014  . Acute superficial venous thrombosis of right lower extremity 08/19/2014  . Hypercalcemia 06/09/2014  . Breast cancer, left breast ([facility] 06/09/2014  . Breast cancer, right breast ([facility] 06/09/2014  . Edema of left lower extremity 03/30/2014  . PAD (peripheral artery disease) ([facility] 03/30/2014  . History of colon cancer 12/11/2013  . History of fall 05/15/2013  . Bilateral leg pain 03/07/2013  . Lower extremity edema 02/19/2013  . Stricture and stenosis of esophagus 10/27/2012  . Thoracic back pain 05/24/2012  .  Tobacco abuse 06/02/2011  . History of breast cancer in female, bilateral.  Mastectomies 10/24/2010. 04/26/2011  . Hiatal hernia 03/22/2011  . Colon polyp 03/22/2011  . Poor circulation 03/22/2011  . 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 NEUROPATHY 08/25/2009  . ANEMIA-IRON DEFICIENCY 04/29/2007  . HLD (hyperlipidemia) 01/29/2007  . Diabetes type 2, controlled ([place]) 01/10/2007  . HYPERTENSION, BENIGN SYSTEMIC 01/10/2007  . Coronary atherosclerosis 01/10/2007  . Chronic systolic heart failure ([place]) 01/10/2007  . Asthma 01/10/2007  . Reflux esophagitis 01/10/2007  . Osteoarthritis,  multiple sites 01/10/2007    Past Surgical History:  Procedure Laterality Date  . Breast masectomy  10/2010   Bilateral masectomy by Dr Lucia Gaskins s/p invasive ductal carcinoma.  Marland Kitchen BREAST SURGERY    . Cardiac  Catherization  2007   Severe 3-vessel disease.  EF 20-25%.  . ESOPHAGOGASTRODUODENOSCOPY  2001   Esophageal Tear  . ESOPHAGOGASTRODUODENOSCOPY  10/27/2012   Procedure: ESOPHAGOGASTRODUODENOSCOPY (EGD);  Surgeon: Irene Shipper, MD;  Location: [facility] ENDOSCOPY;  Service: Endoscopy;  Laterality: N/A;  pat  . JOINT REPLACEMENT Right 2001   Knee  . TOTAL KNEE ARTHROPLASTY  2001    OB History    No data available       Home Medications    Prior to Admission medications   Medication Sig Start Date End Date Taking? Authorizing Provider  acetaminophen (TYLENOL) 500 MG tablet Take 500 mg by mouth every 6 (six) hours as needed for moderate pain (pain).    Historical Provider, MD  albuterol (PROVENTIL HFA;VENTOLIN HFA) 108 (90 BASE) MCG/ACT inhaler Inhale 2 puffs into the lungs every 6 (six) hours as needed. For shortness of breath 01/18/15   [name], MD  amLODipine (NORVASC) 5 MG tablet TAKE 1 TABLET (5 MG TOTAL) BY MOUTH DAILY. 08/11/16   [name], MD  aspirin (ASPIRIN CHILDRENS) 81 MG chewable tablet Chew 81 mg by mouth every other day.     Historical Provider, MD  Blood Pressure KIT Use the blood pressure kit to check you blood pressure twice a day 08/27/15   [name], MD  capsaicin (ZOSTRIX) 0.025 % cream APPLY TO AFFECTED AREA TWICE A DAY 04/11/16   [name], MD  carvedilol (COREG) 12.5 MG tablet Take 1 tablet (12.5 mg total) by mouth 2 (two) times daily with a meal. 12/30/15   [name], MD  clopidogrel (PLAVIX) 75 MG tablet TAKE 1 TABLET BY MOUTH EVERY DAY 08/11/16   [name], MD  diclofenac sodium (VOLTAREN) 1 % GEL APPLY 2 grams topically 4 TIMES DAILY AS NEEDED FOR PAIN 09/23/15   [name], MD  gabapentin (NEURONTIN) 300 MG capsule  Take 1 capsule (300 mg total) by mouth 2 (two) times daily. 01/31/16   [name], MD  glucose blood test strip Use as instructed Dx. Code 250.02 09/28/14   [name], MD  guaifenesin (ROBITUSSIN) 100 MG/5ML syrup Take 10 mLs (200 mg total) by mouth 3 (three) times daily as needed for cough. 08/15/16   [name], MD  loratadine (CLARITIN) 10 MG tablet TAKE 1/2 TABLET BY MOUTH DAILY Patient taking differently: TAKE 1/2 TABLET BY MOUTH DAILY as needed for allergies 12/08/15   [name], MD  losartan (COZAAR) 50 MG tablet TAKE 1 TABLET (50 MG TOTAL) BY MOUTH AT BEDTIME. Patient taking differently: Take 50 mg by mouth daily. . 12/30/15   [name], MD  magnesium hydroxide (MILK OF MAGNESIA) 400 MG/5ML  suspension Take 30 mLs by mouth daily as needed for mild constipation.    Historical Provider, MD  PATADAY 0.2 % SOLN APPLY 1 DROP TO EYE DAILY. Patient taking differently: APPLY 1 DROP TO BOTH EYES DAILY as needed for irritation 11/23/15   [name], MD  ranitidine (ZANTAC) 150 MG capsule TAKE ONE CAPSULE BY MOUTH TWICE A DAY AS NEEDED HEARTBURN 07/24/16   [name], MD  rosuvastatin (CRESTOR) 20 MG tablet Take 1 tablet (20 mg total) by mouth at bedtime. 03/21/16   [name], MD  traMADol (ULTRAM) 50 MG tablet Take 1 tablet (50 mg total) by mouth every 8 (eight) hours as needed for severe pain. 08/11/16   [name], MD    Family History Family History  Problem Relation Age of Onset  . Sickle cell anemia Other   . Heart disease Mother   . Heart disease Father   . Cancer Daughter 10    breast ca  . Hypertension Daughter   . Cancer Daughter 14    breast ca  . Hypertension Daughter   . Heart disease Son     Poor circulation-Left Leg  . Diabetes Daughter   . Hypertension Daughter   . Hyperlipidemia Daughter     Poor circulation- Toe amputation    Social History Social History  Substance Use Topics  . Smoking status: Never Smoker  .  Smokeless tobacco: Current User    Types: Snuff  . Alcohol use No     Allergies   Lisinopril   Review of Systems Review of Systems  Constitutional: Positive for fatigue. Negative for chills and fever.  HENT: Negative for congestion, rhinorrhea and sore throat.   Eyes: Negative for visual disturbance.  Respiratory: Positive for cough and shortness of breath. Negative for wheezing.   Cardiovascular: Negative for chest pain and leg swelling.  Gastrointestinal: Negative for abdominal pain, diarrhea, nausea and vomiting.  Genitourinary: Negative for dysuria, flank pain, vaginal bleeding and vaginal discharge.  Musculoskeletal: Positive for arthralgias and gait problem. Negative for neck pain.  Skin: Positive for wound. Negative for rash.  Allergic/Immunologic: Negative for immunocompromised state.  Neurological: Negative for syncope, weakness and headaches.  Hematological: Does not bruise/bleed easily.  All other systems reviewed and are negative.    Physical Exam Updated Vital Signs BP 138/62   Pulse 72   Temp 98.9 F (37.2 C) (Oral)   Resp 15   Ht '4\' 11"'$  (1.499 m)   Wt 111 lb (50.3 kg)   SpO2 100%   BMI 22.42 kg/m   Physical Exam  Constitutional: She is oriented to person, place, and time. She appears well-developed and well-nourished. No distress.  HENT:  Head: Normocephalic and atraumatic.  Mouth/Throat: No oropharyngeal exudate.  Eyes: Conjunctivae are normal.  Neck: Neck supple.  Cardiovascular: Normal rate, regular rhythm and normal heart sounds.  Exam reveals no friction rub.   No murmur heard. Pulmonary/Chest: Effort normal. No respiratory distress. She has no wheezes. She has rales (Mild, bibasilar).  Abdominal: Soft. She exhibits no distension.  Musculoskeletal: She exhibits no edema.  Neurological: She is alert and oriented to person, place, and time. She exhibits normal muscle tone.  Skin: Skin is warm. Capillary refill takes less than 2 seconds.    Psychiatric: She has a normal mood and affect.  Nursing note and vitals reviewed.   LOWER EXTREMITY EXAM: RIGHT  INSPECTION & PALPATION: Moderate tenderness over right heel. There is skin hyperpigmentation and mild redness in  this area as well. No drainage. No fluctuance.  SENSORY: sensation is intact to light touch in:  Superficial peroneal nerve distribution (over dorsum of foot) Deep peroneal nerve distribution (over first dorsal web space) Sural nerve distribution (over lateral aspect 5th metatarsal) Saphenous nerve distribution (over medial instep)  MOTOR:  + Motor EHL (great toe dorsiflexion) + FHL (great toe plantar flexion)  + TA (ankle dorsiflexion)  + GSC (ankle plantar flexion)  VASCULAR: 2+ dorsalis pedis and posterior tibialis pulses Capillary refill < 2 sec, toes warm and well-perfused  COMPARTMENTS: Soft, warm, well-perfused No pain with passive extension No parethesias   ED Treatments / Results  Labs (all labs ordered are listed, but only abnormal results are displayed) Labs Reviewed  CBC WITH DIFFERENTIAL/PLATELET - Abnormal; Notable for the following:       Result Value   Hemoglobin 10.5 (*)    HCT 32.0 (*)    MCV 68.4 (*)    MCH 22.4 (*)    RDW 16.2 (*)    All other components within normal limits  BASIC METABOLIC PANEL - Abnormal; Notable for the following:    Potassium 3.4 (*)    Glucose, Bld 235 (*)    Calcium 10.6 (*)    GFR calc non Af Amer 55 (*)    All other components within normal limits  CULTURE, BLOOD (ROUTINE X 2)  CULTURE, BLOOD (ROUTINE X 2)  BRAIN NATRIURETIC PEPTIDE  I-STAT CG4 LACTIC ACID, ED    EKG  EKG Interpretation  Date/Time:  Thursday August 24 2016 14:53:17 EDT Ventricular Rate:  65 PR Interval:    QRS Duration: 81 QT Interval:  427 QTC Calculation: 444 R Axis:   2 Text Interpretation:  Sinus rhythm Consider left ventricular hypertrophy No significant change since last tracing Confirmed by Louiza Moor MD,  Lysbeth Galas [phone]) on 08/24/2016 4:52:12 PM       Radiology Dg Chest 2 View  Result Date: 08/24/2016 CLINICAL DATA:  Hervey Ard mid chest pain for the past day with intense belching. History of CHF, breast and colon malignancy the lungs are well-expanded. There is no focal infiltrate. EXAM: CHEST  2 VIEW COMPARISON:  PA and lateral chest x-ray of March 02, 2016 FINDINGS: The lungs are well-expanded. There are increased lung markings at the right base with an area of infiltrate in the right lower lobe posteriorly. There is a small amount of pleural fluid blunting the costophrenic angles on the right. The left lung is clear. The cardiac silhouette is top-normal in size. The central pulmonary vascularity is mildly prominent but not greatly changed from the previous study. There is calcification in the wall of the aortic arch. There are degenerative changes at multiple thoracic disc levels. There is loss of height of the body of approximately L2 which is stable. IMPRESSION: Right lower lobe pneumonia with some associated atelectasis and small pleural effusion. Followup PA and lateral chest X-ray is recommended in 3-4 weeks following trial of antibiotic therapy to ensure resolution and exclude underlying malignancy. Top-normal cardiac size with chronic central pulmonary vascular congestion. Aortic atherosclerosis. Electronically Signed   By: David  Martinique M.D.   On: 08/24/2016 15:23   Dg Foot Complete Right  Result Date: 08/24/2016 CLINICAL DATA:  General right foot pain for 2 days. EXAM: RIGHT FOOT COMPLETE - 3+ VIEW COMPARISON:  None. FINDINGS: No fracture. No subluxation or dislocation. No worrisome lytic or sclerotic osseous abnormality. Degenerative changes are noted at the MTP joint of the great toe. IMPRESSION: No acute bony  findings. Electronically Signed   By: [name] M.D.   On: 08/24/2016 15:22    Procedures Procedures (including critical care time)  Medications Ordered in ED Medications   cefTRIAXone (ROCEPHIN) 1 g in dextrose 5 % 50 mL IVPB (not administered)  azithromycin (ZITHROMAX) 500 mg in dextrose 5 % 250 mL IVPB (not administered)  sodium chloride 0.9 % bolus 500 mL (not administered)     Initial Impression / Assessment and Plan / ED Course  I have reviewed the triage vital signs and the nursing notes.  Pertinent labs & imaging results that were available during my care of the patient were reviewed by me and considered in my medical decision making (see chart for details).  Clinical Course    82 year old female with extensive past medical history as above who presents with right ankle pain as well as general weakness, cough, and fatigue. Exam is as above. Vital signs are stable. Screening lab work is unremarkable. Of note, chest x-ray shows right lower lobe pneumonia. She has no hypoxia but does endorse cough and general fatigue. Regarding her right ankle pain, she has mild erythema and a small, developing pressure ulcer in this area. I suspect this is due to her deconditioning and decreased mobility in the setting of possible URI with community acquired pneumonia. However, she is well-appearing with normal WBC, normal LA, O2 sats 98% and higher on RA, with normal RR - believe she can be safely managed as an outpatient. However, she endorses concern about managing herself at home. Discussed with Hospitalist, Dr. Wendee Beavers, who recommends CM/SW evaluation as pt does not meet inpt criteria, and could not be placed from the hospital. Discussed with social work, who has evaluated and provided with resources but states that pt cannot be placed without 3 night stay. Will have CM come evaluate pt for home health/outpt resources. Pt remains well appearing with stable, normal VS and normal WOB. I believe she can be safely discharged. Current plan is to f/u with CM after discussion with pt and arrangement of home health/PT/wound evaluation at home. Will need doxycycline for CAP/possible mild  cellulitis as well as good return precautions. Family at bedside throughout visit and is very supportive. She seems to have otherwise good family support.  Case management in to discuss outpatient care and follow-up. Face-to-face and home health ordered in [place]. Patient remains hemodynamically stable. Low suspicion for significant underlying illness or infection. Will discharge with doxycycline and outpatient follow-up.  Final Clinical Impressions(s) / ED Diagnoses   Final diagnoses:  Community acquired pneumonia of right lower lobe of lung ([place])  Decubitus ulcer of right heel, stage 1  Impaired mobility      Duffy Bruce, MD 08/24/16 1934    Duffy Bruce, MD 08/24/16 [phone]

## 2016-08-24 NOTE — Telephone Encounter (Signed)
rec'd call from pt's daughter, Katy Apo.  Reported her mother is a patient of Dr. Stephens Shire and is having problems with her right leg and foot.  Daughter, Katy Apo, is not able to give specific symptoms.  Stated pt. Lives with her daughter, [name].  Attempted to call pt. At [phone], per suggestion of Lillie.  Left voice message to call office to discuss pt's symptoms.

## 2016-08-24 NOTE — ED Triage Notes (Signed)
Pt presents via EMS from home.  Pt lives with daughter & states that she has been having pain in her right foot x one week, but that it feels better when elevated.  Per EMS, pt has a history of HTN and takes 3 BP meds daily.    EKG unremarkable BP:182/84; HR:70; RR: 16; CBG: 236 A&O x 4

## 2016-08-28 DIAGNOSIS — I110 Hypertensive heart disease with heart failure: Secondary | ICD-10-CM | POA: Diagnosis not present

## 2016-08-28 DIAGNOSIS — L89610 Pressure ulcer of right heel, unstageable: Secondary | ICD-10-CM | POA: Diagnosis not present

## 2016-08-28 DIAGNOSIS — I5020 Unspecified systolic (congestive) heart failure: Secondary | ICD-10-CM | POA: Diagnosis not present

## 2016-08-28 DIAGNOSIS — L89620 Pressure ulcer of left heel, unstageable: Secondary | ICD-10-CM | POA: Diagnosis not present

## 2016-08-28 DIAGNOSIS — J189 Pneumonia, unspecified organism: Secondary | ICD-10-CM | POA: Diagnosis not present

## 2016-08-28 DIAGNOSIS — E1140 Type 2 diabetes mellitus with diabetic neuropathy, unspecified: Secondary | ICD-10-CM | POA: Diagnosis not present

## 2016-08-28 NOTE — Progress Notes (Signed)
08/28/2016 1658pm  EDCM called and spoke to patient for follow up.  Patient reports [name] has been to the home to see her.  She reports her walker and three in one commode are "working out good."  Patient voiced concern about her IV site from the emergency room.  She reports it is swollen and hurts.  She reports she has an upcoming appointment with her pcp at the end of the month.  [facility] encouraged patient to call her pcp to tell her about her arm as she may want to see patient sooner.  Patient verbalized understanding.  No further EDCM needs at this time.

## 2016-08-29 DIAGNOSIS — J189 Pneumonia, unspecified organism: Secondary | ICD-10-CM | POA: Diagnosis not present

## 2016-08-29 DIAGNOSIS — L89610 Pressure ulcer of right heel, unstageable: Secondary | ICD-10-CM | POA: Diagnosis not present

## 2016-08-29 DIAGNOSIS — I110 Hypertensive heart disease with heart failure: Secondary | ICD-10-CM | POA: Diagnosis not present

## 2016-08-29 DIAGNOSIS — I5020 Unspecified systolic (congestive) heart failure: Secondary | ICD-10-CM | POA: Diagnosis not present

## 2016-08-29 DIAGNOSIS — L89620 Pressure ulcer of left heel, unstageable: Secondary | ICD-10-CM | POA: Diagnosis not present

## 2016-08-29 DIAGNOSIS — E1140 Type 2 diabetes mellitus with diabetic neuropathy, unspecified: Secondary | ICD-10-CM | POA: Diagnosis not present

## 2016-08-29 LAB — CULTURE, BLOOD (ROUTINE X 2)
CULTURE: NO GROWTH
Culture: NO GROWTH

## 2016-08-30 ENCOUNTER — Telehealth: Payer: Self-pay | Admitting: Family Medicine

## 2016-08-30 ENCOUNTER — Emergency Department (HOSPITAL_COMMUNITY): Payer: Medicare Other

## 2016-08-30 ENCOUNTER — Encounter (HOSPITAL_COMMUNITY): Payer: Self-pay | Admitting: *Deleted

## 2016-08-30 ENCOUNTER — Inpatient Hospital Stay (HOSPITAL_COMMUNITY)
Admission: EM | Admit: 2016-08-30 | Discharge: 2016-09-04 | DRG: 175 | Disposition: A | Discharge status: Skilled Nursing Facility | Payer: Medicare Other | Attending: Family Medicine | Admitting: Family Medicine

## 2016-08-30 DIAGNOSIS — E119 Type 2 diabetes mellitus without complications: Secondary | ICD-10-CM

## 2016-08-30 DIAGNOSIS — E1140 Type 2 diabetes mellitus with diabetic neuropathy, unspecified: Secondary | ICD-10-CM | POA: Diagnosis present

## 2016-08-30 DIAGNOSIS — Z79899 Other long term (current) drug therapy: Secondary | ICD-10-CM

## 2016-08-30 DIAGNOSIS — R488 Other symbolic dysfunctions: Secondary | ICD-10-CM | POA: Diagnosis not present

## 2016-08-30 DIAGNOSIS — M6281 Muscle weakness (generalized): Secondary | ICD-10-CM | POA: Diagnosis not present

## 2016-08-30 DIAGNOSIS — Z85038 Personal history of other malignant neoplasm of large intestine: Secondary | ICD-10-CM

## 2016-08-30 DIAGNOSIS — I5030 Unspecified diastolic (congestive) heart failure: Secondary | ICD-10-CM

## 2016-08-30 DIAGNOSIS — E1165 Type 2 diabetes mellitus with hyperglycemia: Secondary | ICD-10-CM | POA: Diagnosis present

## 2016-08-30 DIAGNOSIS — L89619 Pressure ulcer of right heel, unspecified stage: Secondary | ICD-10-CM | POA: Diagnosis present

## 2016-08-30 DIAGNOSIS — E785 Hyperlipidemia, unspecified: Secondary | ICD-10-CM | POA: Diagnosis present

## 2016-08-30 DIAGNOSIS — K219 Gastro-esophageal reflux disease without esophagitis: Secondary | ICD-10-CM | POA: Diagnosis present

## 2016-08-30 DIAGNOSIS — D638 Anemia in other chronic diseases classified elsewhere: Secondary | ICD-10-CM | POA: Diagnosis present

## 2016-08-30 DIAGNOSIS — J45909 Unspecified asthma, uncomplicated: Secondary | ICD-10-CM | POA: Diagnosis present

## 2016-08-30 DIAGNOSIS — R278 Other lack of coordination: Secondary | ICD-10-CM | POA: Diagnosis not present

## 2016-08-30 DIAGNOSIS — Z9013 Acquired absence of bilateral breasts and nipples: Secondary | ICD-10-CM | POA: Diagnosis not present

## 2016-08-30 DIAGNOSIS — I2510 Atherosclerotic heart disease of native coronary artery without angina pectoris: Secondary | ICD-10-CM | POA: Diagnosis present

## 2016-08-30 DIAGNOSIS — F17290 Nicotine dependence, other tobacco product, uncomplicated: Secondary | ICD-10-CM | POA: Diagnosis present

## 2016-08-30 DIAGNOSIS — Z833 Family history of diabetes mellitus: Secondary | ICD-10-CM | POA: Diagnosis not present

## 2016-08-30 DIAGNOSIS — R112 Nausea with vomiting, unspecified: Secondary | ICD-10-CM | POA: Diagnosis not present

## 2016-08-30 DIAGNOSIS — Z86010 Personal history of colonic polyps: Secondary | ICD-10-CM | POA: Diagnosis not present

## 2016-08-30 DIAGNOSIS — Z803 Family history of malignant neoplasm of breast: Secondary | ICD-10-CM | POA: Diagnosis not present

## 2016-08-30 DIAGNOSIS — Z96651 Presence of right artificial knee joint: Secondary | ICD-10-CM | POA: Diagnosis present

## 2016-08-30 DIAGNOSIS — E876 Hypokalemia: Secondary | ICD-10-CM | POA: Diagnosis present

## 2016-08-30 DIAGNOSIS — J189 Pneumonia, unspecified organism: Secondary | ICD-10-CM | POA: Diagnosis present

## 2016-08-30 DIAGNOSIS — R4182 Altered mental status, unspecified: Secondary | ICD-10-CM | POA: Diagnosis not present

## 2016-08-30 DIAGNOSIS — I110 Hypertensive heart disease with heart failure: Secondary | ICD-10-CM | POA: Diagnosis present

## 2016-08-30 DIAGNOSIS — Z853 Personal history of malignant neoplasm of breast: Secondary | ICD-10-CM | POA: Diagnosis not present

## 2016-08-30 DIAGNOSIS — Z66 Do not resuscitate: Secondary | ICD-10-CM | POA: Diagnosis present

## 2016-08-30 DIAGNOSIS — R402411 Glasgow coma scale score 13-15, in the field [EMT or ambulance]: Secondary | ICD-10-CM | POA: Diagnosis not present

## 2016-08-30 DIAGNOSIS — Z7902 Long term (current) use of antithrombotics/antiplatelets: Secondary | ICD-10-CM

## 2016-08-30 DIAGNOSIS — Z8249 Family history of ischemic heart disease and other diseases of the circulatory system: Secondary | ICD-10-CM | POA: Diagnosis not present

## 2016-08-30 DIAGNOSIS — Z7982 Long term (current) use of aspirin: Secondary | ICD-10-CM

## 2016-08-30 DIAGNOSIS — L89602 Pressure ulcer of unspecified heel, stage 2: Secondary | ICD-10-CM | POA: Insufficient documentation

## 2016-08-30 DIAGNOSIS — I2699 Other pulmonary embolism without acute cor pulmonale: Principal | ICD-10-CM | POA: Diagnosis present

## 2016-08-30 DIAGNOSIS — R531 Weakness: Secondary | ICD-10-CM | POA: Diagnosis not present

## 2016-08-30 DIAGNOSIS — Z86718 Personal history of other venous thrombosis and embolism: Secondary | ICD-10-CM

## 2016-08-30 DIAGNOSIS — I5022 Chronic systolic (congestive) heart failure: Secondary | ICD-10-CM | POA: Diagnosis present

## 2016-08-30 DIAGNOSIS — R41841 Cognitive communication deficit: Secondary | ICD-10-CM | POA: Diagnosis not present

## 2016-08-30 LAB — COMPREHENSIVE METABOLIC PANEL
ALBUMIN: 3.5 g/dL (ref 3.5–5.0)
ALT: 10 U/L — AB (ref 14–54)
AST: 19 U/L (ref 15–41)
Alkaline Phosphatase: 50 U/L (ref 38–126)
Anion gap: 8 (ref 5–15)
BUN: 12 mg/dL (ref 6–20)
CHLORIDE: 105 mmol/L (ref 101–111)
CO2: 23 mmol/L (ref 22–32)
CREATININE: 0.91 mg/dL (ref 0.44–1.00)
Calcium: 10.5 mg/dL — ABNORMAL HIGH (ref 8.9–10.3)
GFR calc Af Amer: >60 mL/min (ref >60)
GFR calc non Af Amer: 54 mL/min — ABNORMAL LOW (ref >60)
Glucose, Bld: 191 mg/dL — ABNORMAL HIGH (ref 65–99)
Potassium: 3.3 mmol/L — ABNORMAL LOW (ref 3.5–5.1)
SODIUM: 136 mmol/L (ref 135–145)
Total Bilirubin: 0.8 mg/dL (ref 0.3–1.2)
Total Protein: 8.4 g/dL — ABNORMAL HIGH (ref 6.5–8.1)

## 2016-08-30 LAB — CBC WITH DIFFERENTIAL/PLATELET
BASOS ABS: 0 10*3/uL (ref 0.0–0.1)
BASOS PCT: 0 %
EOS ABS: 0 10*3/uL (ref 0.0–0.7)
EOS PCT: 0 %
HCT: 31.8 % — ABNORMAL LOW (ref 36.0–46.0)
Hemoglobin: 10.6 g/dL — ABNORMAL LOW (ref 12.0–15.0)
LYMPHS PCT: 13 %
Lymphs Abs: 1.1 10*3/uL (ref 0.7–4.0)
MCH: 22.7 pg — ABNORMAL LOW (ref 26.0–34.0)
MCHC: 33.3 g/dL (ref 30.0–36.0)
MCV: 68.1 fL — ABNORMAL LOW (ref 78.0–100.0)
Monocytes Absolute: 0.7 10*3/uL (ref 0.1–1.0)
Monocytes Relative: 9 %
Neutro Abs: 6.3 10*3/uL (ref 1.7–7.7)
Neutrophils Relative %: 77 %
PLATELETS: 220 10*3/uL (ref 150–400)
RBC: 4.67 MIL/uL (ref 3.87–5.11)
RDW: 16 % — ABNORMAL HIGH (ref 11.5–15.5)
WBC: 8.1 10*3/uL (ref 4.0–10.5)

## 2016-08-30 LAB — URINALYSIS, ROUTINE W REFLEX MICROSCOPIC
Bilirubin Urine: NEGATIVE
GLUCOSE, UA: NEGATIVE mg/dL
HGB URINE DIPSTICK: NEGATIVE
Ketones, ur: NEGATIVE mg/dL
LEUKOCYTES UA: NEGATIVE
Nitrite: NEGATIVE
Protein, ur: NEGATIVE mg/dL
SPECIFIC GRAVITY, URINE: 1.02 (ref 1.005–1.030)
pH: 7.5 (ref 5.0–8.0)

## 2016-08-30 LAB — APTT: aPTT: 20 seconds — ABNORMAL LOW (ref 24–36)

## 2016-08-30 LAB — I-STAT CG4 LACTIC ACID, ED
LACTIC ACID, VENOUS: 1.43 mmol/L (ref 0.5–1.9)
Lactic Acid, Venous: 1.42 mmol/L (ref 0.5–1.9)

## 2016-08-30 LAB — PROTIME-INR
INR: 1.16
PROTHROMBIN TIME: 14.9 s (ref 11.4–15.2)

## 2016-08-30 LAB — GLUCOSE, CAPILLARY: GLUCOSE-CAPILLARY: 249 mg/dL — AB (ref 65–99)

## 2016-08-30 MED ORDER — SODIUM CHLORIDE 0.9 % IV BOLUS (SEPSIS): 500.0000 mL | Freq: Once | INTRAVENOUS | Status: DC

## 2016-08-30 MED ORDER — IOPAMIDOL (ISOVUE-370) INJECTION 76%
100.0000 mL | Freq: Once | INTRAVENOUS | Status: AC | PRN
  Administered 2016-08-30: 75 mL via INTRAVENOUS

## 2016-08-30 MED ORDER — FAMOTIDINE 20 MG PO TABS
20.0000 mg | ORAL_TABLET | Freq: Every day | ORAL | Status: DC
  Administered 2016-08-31 – 2016-09-04 (×5): 20 mg via ORAL
  Filled 2016-08-30 (×5): qty 1

## 2016-08-30 MED ORDER — ACETAMINOPHEN 325 MG PO TABS
650.0000 mg | ORAL_TABLET | Freq: Four times a day (QID) | ORAL | Status: DC | PRN
  Administered 2016-09-04: 650 mg via ORAL
  Filled 2016-08-30 (×2): qty 2

## 2016-08-30 MED ORDER — SODIUM CHLORIDE 0.9 % IV SOLN
INTRAVENOUS | Status: AC
  Administered 2016-08-30: via INTRAVENOUS

## 2016-08-30 MED ORDER — GABAPENTIN 300 MG PO CAPS
300.0000 mg | ORAL_CAPSULE | Freq: Two times a day (BID) | ORAL | Status: DC
  Administered 2016-08-31 – 2016-09-04 (×10): 300 mg via ORAL
  Filled 2016-08-30 (×10): qty 1

## 2016-08-30 MED ORDER — HEPARIN BOLUS VIA INFUSION
2000.0000 [IU] | Freq: Once | INTRAVENOUS | Status: AC
  Administered 2016-08-30: 2000 [IU] via INTRAVENOUS
  Filled 2016-08-30: qty 2000

## 2016-08-30 MED ORDER — TRAMADOL HCL 50 MG PO TABS
50.0000 mg | ORAL_TABLET | Freq: Three times a day (TID) | ORAL | Status: DC | PRN
  Administered 2016-08-31 – 2016-09-04 (×2): 50 mg via ORAL
  Filled 2016-08-30 (×2): qty 1

## 2016-08-30 MED ORDER — ACETAMINOPHEN 650 MG RE SUPP: 650.0000 mg | Freq: Four times a day (QID) | RECTAL | Status: DC | PRN

## 2016-08-30 MED ORDER — BISACODYL 5 MG PO TBEC
5.0000 mg | DELAYED_RELEASE_TABLET | ORAL | Status: DC
  Administered 2016-08-31: 5 mg via ORAL
  Filled 2016-08-30: qty 1

## 2016-08-30 MED ORDER — LOSARTAN POTASSIUM 50 MG PO TABS
50.0000 mg | ORAL_TABLET | Freq: Every day | ORAL | Status: DC
  Administered 2016-08-31 – 2016-09-04 (×5): 50 mg via ORAL
  Filled 2016-08-30 (×5): qty 1

## 2016-08-30 MED ORDER — DEXTROSE 5 % IV SOLN
1.0000 g | Freq: Once | INTRAVENOUS | Status: AC
  Administered 2016-08-30: 1 g via INTRAVENOUS
  Filled 2016-08-30: qty 10

## 2016-08-30 MED ORDER — SODIUM CHLORIDE 0.9% FLUSH: 3.0000 mL | INTRAVENOUS | Status: DC | PRN

## 2016-08-30 MED ORDER — ALBUTEROL SULFATE (2.5 MG/3ML) 0.083% IN NEBU: 2.5000 mg | INHALATION_SOLUTION | Freq: Four times a day (QID) | RESPIRATORY_TRACT | Status: DC | PRN

## 2016-08-30 MED ORDER — SODIUM CHLORIDE 0.9 % IV SOLN: 250.0000 mL | INTRAVENOUS | Status: DC | PRN

## 2016-08-30 MED ORDER — AMLODIPINE BESYLATE 5 MG PO TABS
5.0000 mg | ORAL_TABLET | Freq: Every day | ORAL | Status: DC
  Administered 2016-08-31 – 2016-09-04 (×5): 5 mg via ORAL
  Filled 2016-08-30 (×5): qty 1

## 2016-08-30 MED ORDER — DEXTROSE 5 % IV SOLN: 500.0000 mg | Freq: Once | INTRAVENOUS | Status: DC

## 2016-08-30 MED ORDER — CARVEDILOL 12.5 MG PO TABS
12.5000 mg | ORAL_TABLET | Freq: Two times a day (BID) | ORAL | Status: DC
  Administered 2016-08-31 – 2016-09-04 (×9): 12.5 mg via ORAL
  Filled 2016-08-30 (×9): qty 1

## 2016-08-30 MED ORDER — ACETAMINOPHEN 325 MG PO TABS
650.0000 mg | ORAL_TABLET | Freq: Once | ORAL | Status: AC
  Administered 2016-08-30: 650 mg via ORAL
  Filled 2016-08-30: qty 2

## 2016-08-30 MED ORDER — DOXYCYCLINE HYCLATE 100 MG PO TABS
100.0000 mg | ORAL_TABLET | Freq: Two times a day (BID) | ORAL | Status: AC
  Administered 2016-08-31 – 2016-09-03 (×9): 100 mg via ORAL
  Filled 2016-08-30 (×9): qty 1

## 2016-08-30 MED ORDER — SODIUM CHLORIDE 0.9% FLUSH
3.0000 mL | Freq: Two times a day (BID) | INTRAVENOUS | Status: DC
  Administered 2016-08-31 – 2016-09-04 (×10): 3 mL via INTRAVENOUS

## 2016-08-30 MED ORDER — HEPARIN (PORCINE) IN NACL 100-0.45 UNIT/ML-% IJ SOLN
900.0000 [IU]/h | INTRAMUSCULAR | Status: DC
  Administered 2016-08-30: 800 [IU]/h via INTRAVENOUS
  Filled 2016-08-30: qty 250

## 2016-08-30 MED ORDER — ROSUVASTATIN CALCIUM 20 MG PO TABS
20.0000 mg | ORAL_TABLET | Freq: Every day | ORAL | Status: DC
  Administered 2016-08-31 – 2016-09-03 (×5): 20 mg via ORAL
  Filled 2016-08-30 (×5): qty 1

## 2016-08-30 NOTE — Telephone Encounter (Signed)
Rec'd phone call from pt's daughter, Katy Apo.  Reported that her sister didn't call back because she wasn't comfortable answering questions about the pt.  Per daughter, Katy Apo, the pt. Is c/o increased heaviness and discomfort in right LE with both activity and rest; also reported right heel pain when she lays down.  Denied any nonhealing sores on right LE.  Reported the pt's. right heel is dry, cracked and calloused.  Advised to apply a moisturizing lotion to the right heel, and to support the lower leg on a pillow when laying down, to prevent the heel from resting on mattress.  Advised will schedule earlier appt. for pt., and will contact with appt. information.

## 2016-08-30 NOTE — ED Notes (Signed)
Bed: WHALD Expected date:  Expected time:  Means of arrival:  Comments: 

## 2016-08-30 NOTE — ED Triage Notes (Signed)
Per EMS pt from home with c/o AMS since this morning, per EMS pt recently diagnosed with pna, currently on abx (doxycycline). Per EMS initial O2 sats on RA 87%, 98 on 2L.

## 2016-08-30 NOTE — ED Notes (Signed)
Pt refusing lab work unless it is pulled from her IV.  RN is aware.

## 2016-08-30 NOTE — ED Notes (Signed)
Bed: [id number] Expected date:  Expected time:  Means of arrival:  Comments: Hold for hall D

## 2016-08-30 NOTE — Telephone Encounter (Signed)
Pt's daughter states it is very important for Dr. Lorenso Courier to give her a call as soon as possible. [phone], Please advise. Thanks! ep

## 2016-08-30 NOTE — Progress Notes (Signed)
08/30/2016 A. Clinten Howk RNCM 1559pm  EDCM spoke to [place] to place [facility] consult for community resources possible placement.

## 2016-08-30 NOTE — ED Notes (Signed)
Pt's ceftriaxone infusion completed prior to this RN.

## 2016-08-30 NOTE — Telephone Encounter (Signed)
Returned call to [employer]. The patient is refusing everything this morning. She doesn't want to be bothered. She's lying in bed and refuses to move.  She's hurting all over her. She wont let her nurse change or bathe her. She wont eat or drink. This started this AM.  She seems confused per [facility].  She's refusing all medications as well.   Asked if they could get her in this afternoon for a same day appt, daughter notes she can't get her anywhere without an ambulance as she can't move her mother.  Advised daughter to call EMS and have her transported to the ED. She was seen there on 10/12 however I do not see that they checked urine on her for evidence of infection. Blood cultures at that time were negative.   [name], MD [facility] Family Medicine Resident  08/30/2016, 12:25 PM

## 2016-08-30 NOTE — Progress Notes (Signed)
ANTICOAGULATION CONSULT NOTE - Initial Consult  Pharmacy Consult for IV heparin Indication: pulmonary embolus  Allergies  Allergen Reactions  . Lisinopril Cough    Patient Measurements:   Heparin Dosing Weight: 50 kg (TBW)  Vital Signs: Temp: 100.4 F (38 C) (10/18 1640) Temp Source: Oral (10/18 1640) BP: 161/93 (10/18 1830) Pulse Rate: 99 (10/18 1830)  Labs:  Recent Labs  08/30/16 1758  HGB 10.6*  HCT 31.8*  PLT 220  CREATININE 0.91    Estimated Creatinine Clearance: 28 mL/min (by C-G formula based on SCr of 0.91 mg/dL).   Medical History: Past Medical History:  Diagnosis Date  . Allergy   . Anemia    Iron def anemia  . Arthritis   . Asthma   . Breast cancer ([place]) 10/2010   Invasive Ductal Carcinoma, s/p bilateral mastectomy, now on Tamoxifen. Followed by Dr Jamse Arn.   . Breast cancer ([place]) 2011   s/p Bilateral masectomy  . CAD (coronary artery disease)   . Carotid artery aneurysm ([place]) 08/2010   right ICA, 5 x 22mm  . CHF (congestive heart failure) (HCC)    Systolic   . Colon cancer ([place])   . Colon cancer ([place]) 12/11/2013  . DDD (degenerative disc disease), cervical   . Diabetes mellitus   . Diverticulosis   . GERD (gastroesophageal reflux disease)   . H/O: GI bleed   . Hiatal hernia   . History of colon cancer   . Hypercalcemia 06/09/2014  . Hyperlipidemia   . Hypertension   . Myocardial infarction   . Neuropathy ([place])   . Pneumonia 03/2012  . Shortness of breath     Medications:   (Not in a hospital admission)  Assessment: 45 yoF with Hx breast cancer, CAD, CHF, presents 10/18 with AMS and hypoxia. Recently treated for PNA and cellulitis about 1 wk ago.  CTa shows bilateral lobar to subsegmental emboli with preserved RV/LV ratio.   Baseline INR, aPTT: pending  Prior anticoagulation: none PTA  Significant events:  Today, 08/30/2016:  CBC: Hgb low, Plt wnl  No bleeding or infusion issues per nursing  CrCl: 28 ml/min  Goal of  Therapy: Heparin level 0.3-0.7 units/ml Monitor platelets by anticoagulation protocol: Yes  Plan:  Heparin 2000 units IV bolus x 1  Heparin 800 units/hr IV infusion  Check heparin level 8 hrs after start  Daily CBC, daily heparin level once stable  Monitor for signs of bleeding or thrombosis   Reuel Boom, PharmD Pager: [phone] 08/30/2016, 7:57 PM

## 2016-08-30 NOTE — ED Notes (Signed)
Pt is alert, disoriented to day (Tuesday) & year (2002)

## 2016-08-30 NOTE — Telephone Encounter (Signed)
I called the daughter, no answer, I LVM and will try again tomorrow

## 2016-08-30 NOTE — ED Notes (Signed)
Heparin verified with Karna Christmas, RN.

## 2016-08-30 NOTE — ED Notes (Signed)
Patient refused second set of blood culture per RN.

## 2016-08-30 NOTE — ED Notes (Signed)
Pt refusing to be stuck for blood draw and new IV. She is screaming and spitting at the nurse. Will attempt to obtain blood from existing IV.

## 2016-08-30 NOTE — H&P (Signed)
[facility] Admission History and Physical Service Pager: [phone]  [name] Medical record number: [id number] Date of birth: [birth year] Age: 82 y.o. Gender: female  Primary Care Provider: Kathrine Cords, MD Consultants:  None Code Status:  DNR  Chief Complaint:  Altered mental status  Assessment and Plan: [name] is a 82 y.o. female presenting with altered mental status found to have bilateral pulmonary emboli. PMH is significant for T2DM controlled, HLD, Iron deficiency anemia, HTN, CHF, asthma, GERD, OA, hx of breast ca, colon ca, history of suicidal ideation in March 2017, and depression.   #Pulmonary embolism.  Initial O2 sats on RA 87% per EMS, 98% on 2L. This was a new oxygen requirement for patient. Initially tachypneic in ambulance, no longer tachypneic in ED. LA 1.42.  No white count, afebrile.  Vitals stable with elevated BP.  CXR in ED showing elevation of right hemidiaphragm. No acute infiltrate or pleural effusion.  CT Angio performed showing bilateral lower lobe pulmonary emboli.  Started on therapeutic Heparin. No leg pain indicative of DVT source. With chart review patient had h/o superficial venous thrombosis of RLE in 2015. Patient's saughter had called this week stating patient complaining of increased heaviness and discomfort in right LE with both activity and rest; also reported right heel pain when she lays down. Patient states she moves around with walker. Has h/o cancer both colon and breast which increases clot burden. -admit to med-surg, attending Dr. Nori Riis -Continue Heparin per pharm consult (therapeutic) -Heparin level daily -am EKG -cardiac monitoring -continuous pulse ox -PT/OT eval and treat -Tylenol 650 mg prn -AM BMET and CBC -vitals per unit routine -transition anticoagulant to new DOAC -obtain LE dopplers due to complaints of leg pain  -call to obtain additional history from family in  AM  #pneumonia, resolving.  Patient with recent diagnosis of PNA on a course of Doxycycline '100mg'$  po bid. CXR without signs of pneumonia this admission -Continue Doxycycline 100 mg bid to complete course  #unstageable right heel ulcer.  Dry cracked and calloused heels bilaterally. Pressure ulcer on right heel with eschar. -apply moisturizing lotion -support lower legs on pillow when laying to prevent heel from resting on mattress -wound care consult  #History of cancer.  Breast (T1N0M0) and colon ca( remote hx).  S/p bilateral mastectomy in 2011. No signs of recurrence per oncology note 11/2015.  -Tramadol 50 mg Q8 prn for pain  #Anemia.  This is likely anemia of chronic disease and possibly thalassemia trait. Hbg on admission 10.6. She is asymptomatic from the anemia.   -monitor -transfusion threshold Hbg <7 -consider checking iron studies since they have not been done in over 2 years.  (MCV low with elevated RDW consistent with possible iron deficiency anemia)  #asthma. Stable.  -Continue home albuterol neb prn  #HTN.  Elevated BP on admission 180/79.  -Continue home Norvasc 5 mg daily -Continue home Coreg 12.5 mg bid -continue home Cozaar 50 mg daily -follow closely  #Neuropathy. Stable.  -Continue home Gabapentin 300 mg bid  #HLD -Continue home Crestor 20 mg daily  FEN/GI: Regular, IV NS @ KVO, Pepcid Prophylaxis: Heparin (therapeutic)  Disposition:  Admit to med-surg, attending Dr. Nori Riis  History of Present Illness:  [name] is a 82 y.o. female presenting with leg pain.  Per chart review, her daughter had called PCP stating patient refusing everything this morning.  Did not want to be bothered, lying in bed and refusing to move.  Also seemed confused.  Was advised to come to the ED as they cannot transport patient anywhere without an ambulance as she cannot move her mother.  Per EMS notes, recently diagnosed with pneumonia and on course of doxycycline.  Patient states  her feet were hurting earlier today and so her daughter called EMS. Lives at home with daughter, [name].    Per chart review patients daughters have been calling doctors several time over the last wekk due to concerns about patient refusing medications and complaining of leg pain.   Attempted to contact daughter for additional history but she was unavailable by phone. Patient is poor historian.   Review Of Systems: Per HPI.  All systems reviewed and negative.  Review of Systems  Constitutional: Negative.   Respiratory: Negative for shortness of breath.   Cardiovascular: Negative for chest pain.    Patient Active Problem List   Diagnosis Date Noted  . Pulmonary embolus ([place]) 08/30/2016  . Mild dementia 06/01/2016  . Depression 01/20/2016  . Suicidal ideation 01/19/2016  . Prolapse of female pelvic organs 12/28/2015  . Leg weakness, bilateral 12/08/2015  . Weakness of right lower extremity 11/25/2015  . (HFpEF) heart failure with preserved ejection fraction ([place])   . CAD in native artery   . Gout 05/14/2015  . Dyslipidemia   . Essential hypertension   . Seasonal allergies 02/10/2015  . Stroke-like symptoms   . Stroke-like episode ([place]) 01/25/2015  . Syncope and collapse 01/25/2015  . Anemia of chronic illness 08/19/2014  . Acute superficial venous thrombosis of right lower extremity 08/19/2014  . Hypercalcemia 06/09/2014  . Breast cancer, left breast ([place]) 06/09/2014  . Breast cancer, right breast ([place]) 06/09/2014  . Edema of left lower extremity 03/30/2014  . PAD (peripheral artery disease) ([place]) 03/30/2014  . History of colon cancer 12/11/2013  . History of fall 05/15/2013  . Bilateral leg pain 03/07/2013  . Lower extremity edema 02/19/2013  . Stricture and stenosis of esophagus 10/27/2012  . Thoracic back pain 05/24/2012  . Tobacco abuse 06/02/2011  . History of breast cancer in female, bilateral.  Mastectomies 10/24/2010. 04/26/2011  . Hiatal hernia 03/22/2011  .  Colon polyp 03/22/2011  . Poor circulation 03/22/2011  . 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 NEUROPATHY 08/25/2009  . ANEMIA-IRON DEFICIENCY 04/29/2007  . HLD (hyperlipidemia) 01/29/2007  . Diabetes type 2, controlled ([place]) 01/10/2007  . HYPERTENSION, BENIGN SYSTEMIC 01/10/2007  . Coronary atherosclerosis 01/10/2007  . Chronic systolic heart failure ([place]) 01/10/2007  . Asthma 01/10/2007  . Reflux esophagitis 01/10/2007  . Osteoarthritis, multiple sites 01/10/2007   Past Medical History: Past Medical History:  Diagnosis Date  . Allergy   . Anemia    Iron def anemia  . Arthritis   . Asthma   . Breast cancer ([place]) 10/2010   Invasive Ductal Carcinoma, s/p bilateral mastectomy, now on Tamoxifen. Followed by Dr Jamse Arn.   . Breast cancer ([place]) 2011   s/p Bilateral masectomy  . CAD (coronary artery disease)   . Carotid artery aneurysm ([place]) 08/2010   right ICA, 5 x 90m  . CHF (congestive heart failure) ([facility])    Systolic   . Colon cancer ([place]   . Colon cancer ([place] 12/11/2013  . DDD (degenerative disc disease), cervical   . Diabetes mellitus   . Diverticulosis   . GERD (gastroesophageal reflux disease)   . H/O: GI bleed   . Hiatal hernia   . History of colon cancer   . Hypercalcemia 06/09/2014  . Hyperlipidemia   .  Hypertension   . Myocardial infarction   . Neuropathy ([place])   . Pneumonia 03/2012  . Shortness of breath    Past Surgical History: Past Surgical History:  Procedure Laterality Date  . Breast masectomy  10/2010   Bilateral masectomy by Dr Lucia Gaskins s/p invasive ductal carcinoma.  Marland Kitchen BREAST SURGERY    . Cardiac  Catherization  2007   Severe 3-vessel disease.  EF 20-25%.  . ESOPHAGOGASTRODUODENOSCOPY  2001   Esophageal Tear  . ESOPHAGOGASTRODUODENOSCOPY  10/27/2012   Procedure: ESOPHAGOGASTRODUODENOSCOPY (EGD);  Surgeon: Irene Shipper, MD;  Location: [facility] ENDOSCOPY;  Service: Endoscopy;  Laterality: N/A;  pat  . JOINT REPLACEMENT Right 2001   Knee  .  TOTAL KNEE ARTHROPLASTY  2001   Social History: Social History  Substance Use Topics  . Smoking status: Never Smoker  . Smokeless tobacco: Current User    Types: Snuff  . Alcohol use No   Family History: Family History  Problem Relation Age of Onset  . Sickle cell anemia Other   . Heart disease Mother   . Heart disease Father   . Cancer Daughter 28    breast ca  . Hypertension Daughter   . Cancer Daughter 51    breast ca  . Hypertension Daughter   . Heart disease Son     Poor circulation-Left Leg  . Diabetes Daughter   . Hypertension Daughter   . Hyperlipidemia Daughter     Poor circulation- Toe amputation   Allergies and Medications: Allergies  Allergen Reactions  . Lisinopril Cough   No current facility-administered medications on file prior to encounter.    Current Outpatient Prescriptions on File Prior to Encounter  Medication Sig Dispense Refill  . acetaminophen (TYLENOL) 500 MG tablet Take 500 mg by mouth every 6 (six) hours as needed for moderate pain (pain).    Marland Kitchen amLODipine (NORVASC) 5 MG tablet TAKE 1 TABLET (5 MG TOTAL) BY MOUTH DAILY. 30 tablet 6  . aspirin (ASPIRIN CHILDRENS) 81 MG chewable tablet Chew 81 mg by mouth daily.     . capsaicin (ZOSTRIX) 0.025 % cream APPLY TO AFFECTED AREA TWICE A DAY 60 g 1  . carvedilol (COREG) 12.5 MG tablet Take 1 tablet (12.5 mg total) by mouth 2 (two) times daily with a meal. 60 tablet 6  . clopidogrel (PLAVIX) 75 MG tablet TAKE 1 TABLET BY MOUTH EVERY DAY (Patient taking differently: TAKE '75mg'$  TABLET BY MOUTH EVERY DAY) 30 tablet 6  . doxycycline (VIBRAMYCIN) 100 MG capsule Take 1 capsule (100 mg total) by mouth 2 (two) times daily. 20 capsule 0  . gabapentin (NEURONTIN) 300 MG capsule Take 1 capsule (300 mg total) by mouth 2 (two) times daily. 60 capsule 6  . glucose blood test strip Use as instructed Dx. Code 250.02 100 each 12  . guaifenesin (ROBITUSSIN) 100 MG/5ML syrup Take 10 mLs (200 mg total) by mouth 3 (three)  times daily as needed for cough. 120 mL 0  . loratadine (CLARITIN) 10 MG tablet TAKE 1/2 TABLET BY MOUTH DAILY (Patient taking differently: TAKE 1/2 TABLET BY MOUTH DAILY as needed for allergies) 15 tablet 0  . losartan (COZAAR) 50 MG tablet TAKE 1 TABLET (50 MG TOTAL) BY MOUTH AT BEDTIME. (Patient taking differently: Take 50 mg by mouth daily. Marland Kitchen) 30 tablet 6  . Menthol, Topical Analgesic, (BIOFREEZE EX) Apply 1 application topically 2 (two) times daily as needed (pain).    Marland Kitchen PATADAY 0.2 % SOLN APPLY 1 DROP TO EYE DAILY. (  Patient taking differently: APPLY 1 DROP TO BOTH EYES DAILY as needed for irritation) 2.5 mL 0  . ranitidine (ZANTAC) 150 MG capsule TAKE ONE CAPSULE BY MOUTH TWICE A DAY AS NEEDED HEARTBURN (Patient taking differently: TAKE 150 mg CAPSULE BY MOUTH once daily) 60 capsule 2  . rosuvastatin (CRESTOR) 20 MG tablet Take 1 tablet (20 mg total) by mouth at bedtime. 30 tablet 6  . traMADol (ULTRAM) 50 MG tablet Take 1 tablet (50 mg total) by mouth every 8 (eight) hours as needed for severe pain. 75 tablet 0  . albuterol (PROVENTIL HFA;VENTOLIN HFA) 108 (90 BASE) MCG/ACT inhaler Inhale 2 puffs into the lungs every 6 (six) hours as needed. For shortness of breath (Patient not taking: Reported on 08/30/2016) 1 Inhaler 6  . Blood Pressure KIT Use the blood pressure kit to check you blood pressure twice a day 1 each 0  . diclofenac sodium (VOLTAREN) 1 % GEL APPLY 2 grams topically 4 TIMES DAILY AS NEEDED FOR PAIN (Patient not taking: Reported on 08/30/2016) 100 g 1  . [DISCONTINUED] cetirizine (ZYRTEC) 5 MG tablet Take 1 tablet (5 mg total) by mouth daily. Take at night time as it can cause some drowsiness 30 tablet 4   Objective: BP (!) 163/59 (BP Location: Left Arm)   Pulse 87   Temp 98.6 F (37 C) (Oral)   Resp 18   Ht '4\' 11"'$  (1.499 m)   Wt 109 lb 8 oz (49.7 kg)   SpO2 97%   BMI 22.12 kg/m  Exam: General: elderly female lying in hospital bed in no acute distress Eyes: EOMI,  PERRL ENTM: MMM, oropharynx clear Neck: normal, supple Cardiovascular: RRR, no MRG, 2+ pulses Respiratory: CTA B/L, normal effort of breathing Gastrointestinal: soft, NT ND +bs, no masses MSK: no edema, no calf tenderness, right heel ulcer unstageable, skin overlying is dry, cracked, non-erythematous Derm: no rashes, no lesions noted Neuro: AAOx3, no focal deficits, 5/5 strength and sensation.  Psych: mood appropriate  Labs and Imaging: Results for orders placed or performed during the hospital encounter of 08/30/16 (from the past 24 hour(s))  Urinalysis, Routine w reflex microscopic (not at [facility])     Status: None   Collection Time: 08/30/16  4:30 PM  Result Value Ref Range   Color, Urine YELLOW YELLOW   APPearance CLEAR CLEAR   Specific Gravity, Urine 1.020 1.005 - 1.030   pH 7.5 5.0 - 8.0   Glucose, UA NEGATIVE NEGATIVE mg/dL   Hgb urine dipstick NEGATIVE NEGATIVE   Bilirubin Urine NEGATIVE NEGATIVE   Ketones, ur NEGATIVE NEGATIVE mg/dL   Protein, ur NEGATIVE NEGATIVE mg/dL   Nitrite NEGATIVE NEGATIVE   Leukocytes, UA NEGATIVE NEGATIVE  CBC with Differential/Platelet     Status: Abnormal   Collection Time: 08/30/16  5:58 PM  Result Value Ref Range   WBC 8.1 4.0 - 10.5 K/uL   RBC 4.67 3.87 - 5.11 MIL/uL   Hemoglobin 10.6 (L) 12.0 - 15.0 g/dL   HCT 31.8 (L) 36.0 - 46.0 %   MCV 68.1 (L) 78.0 - 100.0 fL   MCH 22.7 (L) 26.0 - 34.0 pg   MCHC 33.3 30.0 - 36.0 g/dL   RDW 16.0 (H) 11.5 - 15.5 %   Platelets 220 150 - 400 K/uL   Neutrophils Relative % 77 %   Neutro Abs 6.3 1.7 - 7.7 K/uL   Lymphocytes Relative 13 %   Lymphs Abs 1.1 0.7 - 4.0 K/uL   Monocytes Relative 9 %  Monocytes Absolute 0.7 0.1 - 1.0 K/uL   Eosinophils Relative 0 %   Eosinophils Absolute 0.0 0.0 - 0.7 K/uL   Basophils Relative 0 %   Basophils Absolute 0.0 0.0 - 0.1 K/uL  Comprehensive metabolic panel     Status: Abnormal   Collection Time: 08/30/16  5:58 PM  Result Value Ref Range   Sodium 136 135 -  145 mmol/L   Potassium 3.3 (L) 3.5 - 5.1 mmol/L   Chloride 105 101 - 111 mmol/L   CO2 23 22 - 32 mmol/L   Glucose, Bld 191 (H) 65 - 99 mg/dL   BUN 12 6 - 20 mg/dL   Creatinine, Ser 5.02 0.44 - 1.00 mg/dL   Calcium 77.4 (H) 8.9 - 10.3 mg/dL   Total Protein 8.4 (H) 6.5 - 8.1 g/dL   Albumin 3.5 3.5 - 5.0 g/dL   AST 19 15 - 41 U/L   ALT 10 (L) 14 - 54 U/L   Alkaline Phosphatase 50 38 - 126 U/L   Total Bilirubin 0.8 0.3 - 1.2 mg/dL   GFR calc non Af Amer 54 (L) >60 mL/min   GFR calc Af Amer >60 >60 mL/min   Anion gap 8 5 - 15  I-Stat CG4 Lactic Acid, ED     Status: None   Collection Time: 08/30/16  6:13 PM  Result Value Ref Range   Lactic Acid, Venous 1.42 0.5 - 1.9 mmol/L  I-Stat CG4 Lactic Acid, ED     Status: None   Collection Time: 08/30/16  8:09 PM  Result Value Ref Range   Lactic Acid, Venous 1.43 0.5 - 1.9 mmol/L  APTT     Status: Abnormal   Collection Time: 08/30/16  9:13 PM  Result Value Ref Range   aPTT 20 (L) 24 - 36 seconds  Protime-INR     Status: None   Collection Time: 08/30/16  9:13 PM  Result Value Ref Range   Prothrombin Time 14.9 11.4 - 15.2 seconds   INR 1.16   Glucose, capillary     Status: Abnormal   Collection Time: 08/30/16 11:37 PM  Result Value Ref Range   Glucose-Capillary 249 (H) 65 - 99 mg/dL  Heparin level (unfractionated)     Status: Abnormal   Collection Time: 08/31/16  4:43 AM  Result Value Ref Range   Heparin Unfractionated 0.29 (L) 0.30 - 0.70 IU/mL  Basic metabolic panel     Status: Abnormal   Collection Time: 08/31/16  4:43 AM  Result Value Ref Range   Sodium 139 135 - 145 mmol/L   Potassium 2.9 (L) 3.5 - 5.1 mmol/L   Chloride 110 101 - 111 mmol/L   CO2 23 22 - 32 mmol/L   Glucose, Bld 132 (H) 65 - 99 mg/dL   BUN 10 6 - 20 mg/dL   Creatinine, Ser 1.28 0.44 - 1.00 mg/dL   Calcium 78.6 8.9 - 76.7 mg/dL   GFR calc non Af Amer >60 >60 mL/min   GFR calc Af Amer >60 >60 mL/min   Anion gap 6 5 - 15  CBC     Status: Abnormal    Collection Time: 08/31/16  4:43 AM  Result Value Ref Range   WBC 5.7 4.0 - 10.5 K/uL   RBC 3.97 3.87 - 5.11 MIL/uL   Hemoglobin 8.7 (L) 12.0 - 15.0 g/dL   HCT 20.9 (L) 47.0 - 96.2 %   MCV 68.3 (L) 78.0 - 100.0 fL   MCH 21.9 (L) 26.0 - 34.0  pg   MCHC 32.1 30.0 - 36.0 g/dL   RDW 16.1 (H) 11.5 - 15.5 %   Platelets 182 150 - 400 K/uL  Glucose, capillary     Status: Abnormal   Collection Time: 08/31/16  6:11 AM  Result Value Ref Range   Glucose-Capillary 128 (H) 65 - 99 mg/dL   *Note: Due to a large number of results and/or encounters for the requested time period, some results have not been displayed. A complete set of results can be found in Results Review.     Dg Chest 2 View  Result Date: 08/30/2016 CLINICAL DATA:  Altered mental status starting this morning EXAM: CHEST  2 VIEW COMPARISON:  08/24/2016 FINDINGS: Cardiomegaly is noted. Elevation of the right hemidiaphragm. No acute infiltrate or pleural effusion. No pulmonary edema. Degenerative changes thoracic spine. IMPRESSION: No active cardiopulmonary disease. Electronically Signed   By: Lahoma Crocker M.D.   On: 08/30/2016 17:09   Ct Angio Chest Pe W And/or Wo Contrast  Result Date: 08/30/2016 CLINICAL DATA:  Low oxygen saturation. Recently diagnosed with pneumonia. EXAM: CT ANGIOGRAPHY CHEST WITH CONTRAST TECHNIQUE: Multidetector CT imaging of the chest was performed using the standard protocol during bolus administration of intravenous contrast. Multiplanar CT image reconstructions and MIPs were obtained to evaluate the vascular anatomy. CONTRAST:  75 mL Isovue 370 IV COMPARISON:  01/21/2013 chest CT and same day chest radiograph FINDINGS: Cardiovascular: Acute nonocclusive pulmonary emboli are noted on the right, chest distal to the right main pulmonary artery extending into the right lower lobe lobar, segmental and subsegmental pulmonary arteries. Small subsegmental Edit occlusive left lower lobe pulmonary embolus is also identified.  The RV to LV ratio is approximately 0.84. The heart is top normal. Coronary arteriosclerosis. No pericardial effusion. Aortic atherosclerosis. No aneurysm. Mediastinum/Nodes: 1 cm short axis right lower paratracheal lymph node. Similar sized aorticopulmonary short axis lymph nodes. Small right hilar lymph nodes. Small hiatal hernia with mild-to-moderate diffuse thickening of the esophagus possibly inflammatory. Lungs/Pleura: No pneumonic consolidations. Lingular, right middle lobe and left upper lobe atelectasis. Upper Abdomen: Mild thickening of the left adrenal apex. No acute abnormality. Musculoskeletal: Degenerative disc disease along the visualized dorsal spine. No acute osseous abnormality. Review of the MIP images confirms the above findings. IMPRESSION: Bilateral lower lobe pulmonary emboli, nonocclusive involving right lobar through subsegmental pulmonary arteries to the right lower lobe and occlusive subsegmental left lower lobe pulmonary arteries. The RV to LV ratio slightly under the cutoff for submassive PE. Critical Value/emergent results were called by telephone at the time of interpretation on 08/30/2016 at 7:53 pm to Dr. Shirlyn Goltz , who verbally acknowledged these results. Electronically Signed   By: Ashley Royalty M.D.   On: 08/30/2016 19:54   Lovenia Kim, MD 08/31/2016, 3:04 AM PGY-1, [facility] pager: [phone], text pages welcome  FPTS Upper-Level Resident Addendum  I have independently interviewed and examined the patient. I have discussed the above with the original author and agree with their documentation. My edits for correction/addition/clarification are in pink. Please see also any attending notes.   Katheren Shams, DO PGY-2, [place] Service pager: [phone] (text pages welcome through [facility])

## 2016-08-30 NOTE — ED Provider Notes (Signed)
[facility] DEPT Provider Note   CSN: [id number] Arrival date & time: 08/30/16  1606     History   Chief Complaint Chief Complaint  Patient presents with  . Altered Mental Status    HPI [name] is a 82 y.o. female Here presenting with breast cancer status post mastectomy, CAD, CHF here presenting with altered mental status, hypoxia. Patient was seen here about a week ago with right heel ulcer pain and was felt to have mild cellulitis. A chest x-ray at that time was done and showed a right lower lobe pneumonia. Patient was sent home and prescribe a course of doxycycline. This morning, patient was noted to be more confused than usual. EMS got there and noticed that she was tachypneic and hypoxic to 87% on room air. She was placed on 2 L [place]. Patient was sent to the ED.   The history is provided by the patient.    Past Medical History:  Diagnosis Date  . Allergy   . Anemia    Iron def anemia  . Arthritis   . Asthma   . Breast cancer ([place]) 10/2010   Invasive Ductal Carcinoma, s/p bilateral mastectomy, now on Tamoxifen. Followed by Dr Jamse Arn.   . Breast cancer ([place]) 2011   s/p Bilateral masectomy  . CAD (coronary artery disease)   . Carotid artery aneurysm ([place]) 08/2010   right ICA, 5 x 70m  . CHF (congestive heart failure) (HCC)    Systolic   . Colon cancer ([place]   . Colon cancer ([facility] 12/11/2013  . DDD (degenerative disc disease), cervical   . Diabetes mellitus   . Diverticulosis   . GERD (gastroesophageal reflux disease)   . H/O: GI bleed   . Hiatal hernia   . History of colon cancer   . Hypercalcemia 06/09/2014  . Hyperlipidemia   . Hypertension   . Myocardial infarction   . Neuropathy ([facility]   . Pneumonia 03/2012  . Shortness of breath     Patient Active Problem List   Diagnosis Date Noted  . Mild dementia 06/01/2016  . Depression 01/20/2016  . Suicidal ideation 01/19/2016  . Prolapse of female pelvic organs 12/28/2015  . Leg weakness, bilateral  12/08/2015  . Weakness of right lower extremity 11/25/2015  . (HFpEF) heart failure with preserved ejection fraction ([facility]   . CAD in native artery   . Gout 05/14/2015  . Dyslipidemia   . Essential hypertension   . Seasonal allergies 02/10/2015  . Stroke-like symptoms   . Stroke-like episode ([facility] 01/25/2015  . Syncope and collapse 01/25/2015  . Anemia of chronic illness 08/19/2014  . Acute superficial venous thrombosis of right lower extremity 08/19/2014  . Hypercalcemia 06/09/2014  . Breast cancer, left breast ([facility] 06/09/2014  . Breast cancer, right breast ([facility] 06/09/2014  . Edema of left lower extremity 03/30/2014  . PAD (peripheral artery disease) ([facility] 03/30/2014  . History of colon cancer 12/11/2013  . History of fall 05/15/2013  . Bilateral leg pain 03/07/2013  . Lower extremity edema 02/19/2013  . Stricture and stenosis of esophagus 10/27/2012  . Thoracic back pain 05/24/2012  . Tobacco abuse 06/02/2011  . History of breast cancer in female, bilateral.  Mastectomies 10/24/2010. 04/26/2011  . Hiatal hernia 03/22/2011  . Colon polyp 03/22/2011  . Poor circulation 03/22/2011  . 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 NEUROPATHY 08/25/2009  . ANEMIA-IRON DEFICIENCY 04/29/2007  . HLD (hyperlipidemia) 01/29/2007  . Diabetes type 2, controlled ([facility] 01/10/2007  . HYPERTENSION, BENIGN  SYSTEMIC 01/10/2007  . Coronary atherosclerosis 01/10/2007  . Chronic systolic heart failure ([place]) 01/10/2007  . Asthma 01/10/2007  . Reflux esophagitis 01/10/2007  . Osteoarthritis, multiple sites 01/10/2007    Past Surgical History:  Procedure Laterality Date  . Breast masectomy  10/2010   Bilateral masectomy by Dr Lucia Gaskins s/p invasive ductal carcinoma.  Marland Kitchen BREAST SURGERY    . Cardiac  Catherization  2007   Severe 3-vessel disease.  EF 20-25%.  . ESOPHAGOGASTRODUODENOSCOPY  2001   Esophageal Tear  . ESOPHAGOGASTRODUODENOSCOPY  10/27/2012   Procedure: ESOPHAGOGASTRODUODENOSCOPY  (EGD);  Surgeon: Irene Shipper, MD;  Location[facility]COPY;  Service: Endoscopy;  Laterality: N/A;  pat  . JOINT REPLACEMENT Right 2001   Knee  . TOTAL KNEE ARTHROPLASTY  2001    OB History    No data available       Home Medications    Prior to Admission medications   Medication Sig Start Date End Date Taking? Authorizing Provider  acetaminophen (TYLENOL) 500 MG tablet Take 500 mg by mouth every 6 (six) hours as needed for moderate pain (pain).    Historical Provider, MD  albuterol (PROVENTIL HFA;VENTOLIN HFA) 108 (90 BASE) MCG/ACT inhaler Inhale 2 puffs into the lungs every 6 (six) hours as needed. For shortness of breath 01/18/15   [name], MD  amLODipine (NORVASC) 5 MG tablet TAKE 1 TABLET (5 MG TOTAL) BY MOUTH DAILY. 08/11/16   [name], MD  aspirin (ASPIRIN CHILDRENS) 81 MG chewable tablet Chew 81 mg by mouth daily.     Historical Provider, MD  Blood Pressure KIT Use the blood pressure kit to check you blood pressure twice a day 08/27/15   [name], MD  capsaicin (ZOSTRIX) 0.025 % cream APPLY TO AFFECTED AREA TWICE A DAY Patient not taking: Reported on 08/24/2016 04/11/16   [name], MD  carvedilol (COREG) 12.5 MG tablet Take 1 tablet (12.5 mg total) by mouth 2 (two) times daily with a meal. 12/30/15   [name], MD  clopidogrel (PLAVIX) 75 MG tablet TAKE 1 TABLET BY MOUTH EVERY DAY Patient taking differently: TAKE '75mg'$  TABLET BY MOUTH EVERY DAY 08/11/16   [name], MD  diclofenac sodium (VOLTAREN) 1 % GEL APPLY 2 grams topically 4 TIMES DAILY AS NEEDED FOR PAIN 09/23/15   [name], MD  doxycycline (VIBRAMYCIN) 100 MG capsule Take 1 capsule (100 mg total) by mouth 2 (two) times daily. 08/24/16 09/03/16  Duffy Bruce, MD  gabapentin (NEURONTIN) 300 MG capsule Take 1 capsule (300 mg total) by mouth 2 (two) times daily. 01/31/16   [name], MD  glucose blood test strip Use as instructed Dx. Code 250.02 09/28/14   [name], MD  guaifenesin (ROBITUSSIN) 100 MG/5ML syrup Take 10 mLs (200 mg total) by mouth 3 (three) times daily as needed for cough. 08/15/16   [name], MD  loratadine (CLARITIN) 10 MG tablet TAKE 1/2 TABLET BY MOUTH DAILY Patient taking differently: TAKE 1/2 TABLET BY MOUTH DAILY as needed for allergies 12/08/15   [name], MD  losartan (COZAAR) 50 MG tablet TAKE 1 TABLET (50 MG TOTAL) BY MOUTH AT BEDTIME. Patient taking differently: Take 50 mg by mouth daily. . 12/30/15   [name], MD  magnesium hydroxide (MILK OF MAGNESIA) 400 MG/5ML suspension Take 30 mLs by mouth daily as needed for mild constipation.    Historical Provider, MD  Menthol, Topical Analgesic, (BIOFREEZE EX) Apply  1 application topically 2 (two) times daily as needed (pain).    Historical Provider, MD  PATADAY 0.2 % SOLN APPLY 1 DROP TO EYE DAILY. Patient taking differently: APPLY 1 DROP TO BOTH EYES DAILY as needed for irritation 11/23/15   [name], MD  ranitidine (ZANTAC) 150 MG capsule TAKE ONE CAPSULE BY MOUTH TWICE A DAY AS NEEDED HEARTBURN Patient taking differently: TAKE 150 mg CAPSULE BY MOUTH once daily 07/24/16   [name], MD  rosuvastatin (CRESTOR) 20 MG tablet Take 1 tablet (20 mg total) by mouth at bedtime. 03/21/16   [name], MD  traMADol (ULTRAM) 50 MG tablet Take 1 tablet (50 mg total) by mouth every 8 (eight) hours as needed for severe pain. 08/11/16   [name], MD    Family History Family History  Problem Relation Age of Onset  . Sickle cell anemia Other   . Heart disease Mother   . Heart disease Father   . Cancer Daughter 20    breast ca  . Hypertension Daughter   . Cancer Daughter 85    breast ca  . Hypertension Daughter   . Heart disease Son     Poor circulation-Left Leg  . Diabetes Daughter   . Hypertension Daughter   . Hyperlipidemia Daughter     Poor circulation- Toe amputation    Social History Social History  Substance Use Topics  .  Smoking status: Never Smoker  . Smokeless tobacco: Current User    Types: Snuff  . Alcohol use No     Allergies   Lisinopril   Review of Systems Review of Systems  Unable to perform ROS: Mental status change  All other systems reviewed and are negative.    Physical Exam Updated Vital Signs BP 161/93   Pulse 99   Temp 100.4 F (38 C) (Oral)   Resp 18   SpO2 96%   Physical Exam  Constitutional:  Confused.   HENT:  Head: Normocephalic.  Eyes: EOM are normal. Pupils are equal, round, and reactive to light.  Neck: Normal range of motion.  Cardiovascular: Normal rate, regular rhythm and normal heart sounds.   Pulmonary/Chest:  Slightly tachypneic, + crackles R base   Abdominal: Soft. Bowel sounds are normal. She exhibits no distension. There is no tenderness. There is no guarding.  Musculoskeletal: Normal range of motion.  R heel ulcer with no obvious abscess or cellulitis   Neurological: She is alert.  Confused, A & O x 1. Moving all extremities   Skin: Skin is warm.  Psychiatric: She has a normal mood and affect.  Nursing note and vitals reviewed.    ED Treatments / Results  Labs (all labs ordered are listed, but only abnormal results are displayed) Labs Reviewed  CBC WITH DIFFERENTIAL/PLATELET - Abnormal; Notable for the following:       Result Value   Hemoglobin 10.6 (*)    HCT 31.8 (*)    MCV 68.1 (*)    MCH 22.7 (*)    RDW 16.0 (*)    All other components within normal limits  COMPREHENSIVE METABOLIC PANEL - Abnormal; Notable for the following:    Potassium 3.3 (*)    Glucose, Bld 191 (*)    Calcium 10.5 (*)    Total Protein 8.4 (*)    ALT 10 (*)    GFR calc non Af Amer 54 (*)    All other components within normal limits  CULTURE, BLOOD (ROUTINE X 2)  URINE CULTURE  URINALYSIS, ROUTINE W REFLEX MICROSCOPIC (NOT AT [facility])  HEPARIN LEVEL (UNFRACTIONATED)  APTT  PROTIME-INR  CBC  I-STAT CG4 LACTIC ACID, ED  I-STAT CG4 LACTIC ACID, ED    EKG   EKG Interpretation None       Radiology Dg Chest 2 View  Result Date: 08/30/2016 CLINICAL DATA:  Altered mental status starting this morning EXAM: CHEST  2 VIEW COMPARISON:  08/24/2016 FINDINGS: Cardiomegaly is noted. Elevation of the right hemidiaphragm. No acute infiltrate or pleural effusion. No pulmonary edema. Degenerative changes thoracic spine. IMPRESSION: No active cardiopulmonary disease. Electronically Signed   By: Natasha Mead M.D.   On: 08/30/2016 17:09   Ct Angio Chest Pe W And/or Wo Contrast  Result Date: 08/30/2016 CLINICAL DATA:  Low oxygen saturation. Recently diagnosed with pneumonia. EXAM: CT ANGIOGRAPHY CHEST WITH CONTRAST TECHNIQUE: Multidetector CT imaging of the chest was performed using the standard protocol during bolus administration of intravenous contrast. Multiplanar CT image reconstructions and MIPs were obtained to evaluate the vascular anatomy. CONTRAST:  75 mL Isovue 370 IV COMPARISON:  01/21/2013 chest CT and same day chest radiograph FINDINGS: Cardiovascular: Acute nonocclusive pulmonary emboli are noted on the right, chest distal to the right main pulmonary artery extending into the right lower lobe lobar, segmental and subsegmental pulmonary arteries. Small subsegmental Edit occlusive left lower lobe pulmonary embolus is also identified. The RV to LV ratio is approximately 0.84. The heart is top normal. Coronary arteriosclerosis. No pericardial effusion. Aortic atherosclerosis. No aneurysm. Mediastinum/Nodes: 1 cm short axis right lower paratracheal lymph node. Similar sized aorticopulmonary short axis lymph nodes. Small right hilar lymph nodes. Small hiatal hernia with mild-to-moderate diffuse thickening of the esophagus possibly inflammatory. Lungs/Pleura: No pneumonic consolidations. Lingular, right middle lobe and left upper lobe atelectasis. Upper Abdomen: Mild thickening of the left adrenal apex. No acute abnormality. Musculoskeletal: Degenerative disc disease  along the visualized dorsal spine. No acute osseous abnormality. Review of the MIP images confirms the above findings. IMPRESSION: Bilateral lower lobe pulmonary emboli, nonocclusive involving right lobar through subsegmental pulmonary arteries to the right lower lobe and occlusive subsegmental left lower lobe pulmonary arteries. The RV to LV ratio slightly under the cutoff for submassive PE. Critical Value/emergent results were called by telephone at the time of interpretation on 08/30/2016 at 7:53 pm to Dr. Chaney Malling , who verbally acknowledged these results. Electronically Signed   By: Tollie Eth M.D.   On: 08/30/2016 19:54    Procedures Procedures (including critical care time)  CRITICAL CARE Performed by: Richardean Canal   Total critical care time: 30 minutes  Critical care time was exclusive of separately billable procedures and treating other patients.  Critical care was necessary to treat or prevent imminent or life-threatening deterioration.  Critical care was time spent personally by me on the following activities: development of treatment plan with patient and/or surrogate as well as nursing, discussions with consultants, evaluation of patient's response to treatment, examination of patient, obtaining history from patient or surrogate, ordering and performing treatments and interventions, ordering and review of laboratory studies, ordering and review of radiographic studies, pulse oximetry and re-evaluation of patient's condition.   Medications Ordered in ED Medications  sodium chloride 0.9 % bolus 500 mL (not administered)  azithromycin (ZITHROMAX) 500 mg in dextrose 5 % 250 mL IVPB (not administered)  heparin bolus via infusion 2,000 Units (not administered)  heparin ADULT infusion 100 units/mL (25000 units/276mL sodium chloride 0.45%) (not administered)  cefTRIAXone (ROCEPHIN) 1 g in  dextrose 5 % 50 mL IVPB (0 g Intravenous Stopped 08/30/16 1957)  acetaminophen (TYLENOL) tablet  650 mg (650 mg Oral Given 08/30/16 1955)  iopamidol (ISOVUE-370) 76 % injection 100 mL (75 mLs Intravenous Contrast Given 08/30/16 1856)     Initial Impression / Assessment and Plan / ED Course  I have reviewed the triage vital signs and the nursing notes.  Pertinent labs & imaging results that were available during my care of the patient were reviewed by me and considered in my medical decision making (see chart for details).  Clinical Course    AQUEELAH COTRELL is a 82 y.o. female here with AMS, hypoxia. Febrile in the ED 100.4 F. Patient was hypoxic per EMS but O2 97% on RA here. Will do sepsis workup. Consider CT angio chest if CXR clear.   8:03 PM Labs and UA unremarkable. CXR clear. CT angio showed submassive PE. Started on heparin. Family practice to admit. Not hypotensive, just tachycardic. Will transfer to [facility] to admit to tele.   Final Clinical Impressions(s) / ED Diagnoses   Final diagnoses:  None    New Prescriptions New Prescriptions   No medications on file     Drenda Freeze, MD 08/30/16 2009

## 2016-08-31 ENCOUNTER — Other Ambulatory Visit: Payer: Self-pay

## 2016-08-31 DIAGNOSIS — L89602 Pressure ulcer of unspecified heel, stage 2: Secondary | ICD-10-CM

## 2016-08-31 HISTORY — DX: Pressure ulcer of unspecified heel, stage 2: L89.602

## 2016-08-31 LAB — BASIC METABOLIC PANEL
Anion gap: 6 (ref 5–15)
BUN: 10 mg/dL (ref 6–20)
CHLORIDE: 110 mmol/L (ref 101–111)
CO2: 23 mmol/L (ref 22–32)
CREATININE: 0.79 mg/dL (ref 0.44–1.00)
Calcium: 10 mg/dL (ref 8.9–10.3)
GFR calc Af Amer: >60 mL/min (ref >60)
GFR calc non Af Amer: >60 mL/min (ref >60)
GLUCOSE: 132 mg/dL — AB (ref 65–99)
POTASSIUM: 2.9 mmol/L — AB (ref 3.5–5.1)
Sodium: 139 mmol/L (ref 135–145)

## 2016-08-31 LAB — CBC
HEMATOCRIT: 27.1 % — AB (ref 36.0–46.0)
Hemoglobin: 8.7 g/dL — ABNORMAL LOW (ref 12.0–15.0)
MCH: 21.9 pg — AB (ref 26.0–34.0)
MCHC: 32.1 g/dL (ref 30.0–36.0)
MCV: 68.3 fL — AB (ref 78.0–100.0)
PLATELETS: 182 10*3/uL (ref 150–400)
RBC: 3.97 MIL/uL (ref 3.87–5.11)
RDW: 16.1 % — AB (ref 11.5–15.5)
WBC: 5.7 10*3/uL (ref 4.0–10.5)

## 2016-08-31 LAB — MAGNESIUM: Magnesium: 1.7 mg/dL (ref 1.7–2.4)

## 2016-08-31 LAB — GLUCOSE, CAPILLARY: Glucose-Capillary: 128 mg/dL — ABNORMAL HIGH (ref 65–99)

## 2016-08-31 LAB — HEPARIN LEVEL (UNFRACTIONATED)
HEPARIN UNFRACTIONATED: 0.32 [IU]/mL (ref 0.30–0.70)
Heparin Unfractionated: 0.29 IU/mL — ABNORMAL LOW (ref 0.30–0.70)

## 2016-08-31 MED ORDER — HYDROCERIN EX CREA
TOPICAL_CREAM | Freq: Two times a day (BID) | CUTANEOUS | Status: DC
  Administered 2016-08-31 – 2016-09-04 (×9): via TOPICAL
  Filled 2016-08-31: qty 113

## 2016-08-31 MED ORDER — APIXABAN 5 MG PO TABS: 5.0000 mg | ORAL_TABLET | Freq: Two times a day (BID) | ORAL | Status: DC

## 2016-08-31 MED ORDER — RIVAROXABAN 15 MG PO TABS: 15.0000 mg | ORAL_TABLET | Freq: Two times a day (BID) | ORAL | Status: DC

## 2016-08-31 MED ORDER — APIXABAN 5 MG PO TABS
10.0000 mg | ORAL_TABLET | Freq: Two times a day (BID) | ORAL | Status: DC
  Administered 2016-08-31 – 2016-09-04 (×9): 10 mg via ORAL
  Filled 2016-08-31 (×9): qty 2

## 2016-08-31 MED ORDER — POTASSIUM CHLORIDE CRYS ER 20 MEQ PO TBCR
40.0000 meq | EXTENDED_RELEASE_TABLET | Freq: Two times a day (BID) | ORAL | Status: DC
  Administered 2016-08-31 – 2016-09-04 (×9): 40 meq via ORAL
  Filled 2016-08-31 (×10): qty 2

## 2016-08-31 MED ORDER — RIVAROXABAN 20 MG PO TABS: 20.0000 mg | ORAL_TABLET | Freq: Every day | ORAL | Status: DC

## 2016-08-31 MED ORDER — APIXABAN 2.5 MG PO TABS: 2.5000 mg | ORAL_TABLET | Freq: Two times a day (BID) | ORAL | Status: DC

## 2016-08-31 NOTE — Progress Notes (Signed)
OT Cancellation Note  Patient Details Name: [name] MRN: [id number] DOB: 02/21/1924   Cancelled Treatment:    Reason Eval/Treat Not Completed: Medical issues which prohibited therapy (Subtherapeutic heparin, LE doppler pending) Will follow.  [name] 08/31/2016, 9:51 AM  [phone]
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
## 2016-08-31 NOTE — Discharge Instructions (Addendum)
Information on my medicine - ELIQUIS (apixaban)  This medication education was reviewed with me or my healthcare representative as part of my discharge preparation.  Why was Eliquis prescribed for you? Eliquis was prescribed to treat blood clots that may have been found in the veins of your legs (deep vein thrombosis) or in your lungs (pulmonary embolism) and to reduce the risk of them occurring again.  What do You need to know about Eliquis ? The starting dose is 10 mg (two 5 mg tablets) taken TWICE daily for the FIRST SEVEN (7) DAYS, then on (enter date)  09/07/2016  the dose is reduced to ONE 5 mg tablet taken TWICE daily.  Eliquis may be taken with or without food.   Try to take the dose about the same time in the morning and in the evening. If you have difficulty swallowing the tablet whole please discuss with your pharmacist how to take the medication safely.  Take Eliquis exactly as prescribed and DO NOT stop taking Eliquis without talking to the doctor who prescribed the medication.  Stopping may increase your risk of developing a new blood clot.  Refill your prescription before you run out.  After discharge, you should have regular check-up appointments with your healthcare provider that is prescribing your Eliquis.    What do you do if you miss a dose? If a dose of ELIQUIS is not taken at the scheduled time, take it as soon as possible on the same day and twice-daily administration should be resumed. The dose should not be doubled to make up for a missed dose.  Important Safety Information A possible side effect of Eliquis is bleeding. You should call your healthcare provider right away if you experience any of the following: ? Bleeding from an injury or your nose that does not stop. ? Unusual colored urine (red or dark brown) or unusual colored stools (red or black). ? Unusual bruising for unknown reasons. ? A serious fall or if you hit your head (even if there is no  bleeding).  Some medicines may interact with Eliquis and might increase your risk of bleeding or clotting while on Eliquis. To help avoid this, consult your healthcare provider or pharmacist prior to using any new prescription or non-prescription medications, including herbals, vitamins, non-steroidal anti-inflammatory drugs (NSAIDs) and supplements.  This website has more information on Eliquis (apixaban): http://www.eliquis.com/eliquis/home    Nutrition [facility] Stay Proper nutrition can help your body recover from illness and injury.   Foods and beverages high in protein, vitamins, and minerals help rebuild muscle loss, promote healing, & reduce fall risk.   In addition to eating healthy foods, a nutrition shake is an easy, delicious way to get the nutrition you need during and after your hospital stay  It is recommended that you continue to drink 2 bottles per day of:       Ensure Plus/Boost Plus for at least 1 month (30 days) after your hospital stay   Tips for adding a nutrition shake into your routine: As allowed, drink one with vitamins or medications instead of water or juice Enjoy one as a tasty mid-morning or afternoon snack Drink cold or make a milkshake out of it Drink one instead of milk with cereal or snacks Use as a coffee creamer   Available at the following grocery stores and pharmacies:           * [place] [facility]          *  [place] visit: www.ensure.com/join or http://dawson-may.com/   Suggested Substitutions Ensure Plus = Boost Plus = Carnation Breakfast Essentials = Boost Compact Ensure Active Clear = Boost Breeze Glucerna Shake = Boost Glucose Control = Carnation Breakfast Essentials SUGAR FREE

## 2016-08-31 NOTE — Progress Notes (Signed)
ANTICOAGULATION CONSULT NOTE - Initial Consult  Pharmacy Consult for Apixaban Indication: pulmonary embolus  Allergies  Allergen Reactions  . Lisinopril Cough    Patient Measurements: Height: 4\' 11"  (149.9 cm) Weight: 110 lb (49.9 kg) IBW/kg (Calculated) : 43.2  Vital Signs: Temp: 98.3 F (36.8 C) (10/19 0611) Temp Source: Oral (10/19 0611) BP: 150/52 (10/19 0611) Pulse Rate: 74 (10/19 0611)  Labs:  Recent Labs  08/30/16 1758 08/30/16 2113 08/31/16 0443  HGB 10.6*  --  8.7*  HCT 31.8*  --  27.1*  PLT 220  --  182  APTT  --  20*  --   LABPROT  --  14.9  --   INR  --  1.16  --   HEPARINUNFRC  --   --  0.29*  CREATININE 0.91  --  0.79    Estimated Creatinine Clearance: 31.9 mL/min (by C-G formula based on SCr of 0.79 mg/dL).   Medical History: Past Medical History:  Diagnosis Date  . Allergy   . Anemia    Iron def anemia  . Arthritis   . Asthma   . Breast cancer ([place]) 10/2010   Invasive Ductal Carcinoma, s/p bilateral mastectomy, now on Tamoxifen. Followed by Dr Jamse Arn.   . Breast cancer ([place]) 2011   s/p Bilateral masectomy  . CAD (coronary artery disease)   . Carotid artery aneurysm ([place]) 08/2010   right ICA, 5 x 63mm  . CHF (congestive heart failure) (HCC)    Systolic   . Colon cancer ([place])   . Colon cancer ([place]) 12/11/2013  . DDD (degenerative disc disease), cervical   . Diabetes mellitus   . Diverticulosis   . GERD (gastroesophageal reflux disease)   . H/O: GI bleed   . Hiatal hernia   . History of colon cancer   . Hypercalcemia 06/09/2014  . Hyperlipidemia   . Hypertension   . Myocardial infarction   . Neuropathy ([place])   . Pneumonia 03/2012  . Shortness of breath     Assessment: 64 yof continuing on heparin for new bilateral PE. Not on anticoagulation PTA. Pharmacy consulted to transition to apixaban. Advanced age, Nigel Mormon, and CrCl~32 noted, but no dosage adjustment recommended for this indication (Patients with CrCl<25 not included in  trials). Hg down 8.7, plt wnl, no bleed issues per RN.   Goal of Therapy:  VTE treatment/prevention Monitor platelets by anticoagulation protocol: Yes   Plan:  Turn off heparin drip when 1st dose of apixaban given - communicated with RN Apixaban 10mg  PO BID x 7 days; then 5mg  PO BID Monitor CBC, s/sx bleeding   Elicia Lamp, PharmD, BCPS Clinical Pharmacist 08/31/2016 11:59 AM

## 2016-08-31 NOTE — Progress Notes (Signed)
Pt received from [facility] ED with a diagnosis of AMS, pt is alert times 2-3, vitals stable, safety measures applied, admission assessment done, prescribed meds provided, will continue to monitor.

## 2016-08-31 NOTE — Consult Note (Signed)
   [facility] CM Inpatient Consult   08/31/2016  [name] [birth year] [id number]   Patient evaluated for community based chronic disease management services with [place] [facility] as a benefit of patient's Medicare Insurance. Spoke with patient at bedside to explain [facility] Management services.  Patient will receive post hospital discharge call and will be evaluated for monthly home visits for assessments and disease process education. Consent form signed.  Left contact information and [facility] literature at bedside. Made Inpatient Case Manager aware that [place] Management following. Of note, [facility] Management services does not replace or interfere with any services that are arranged by inpatient case management or social work.  For additional questions or referrals please contact:    Natividad Brood, RN BSN [facility] Liaison  [phone] business mobile phone Toll free office [phone]

## 2016-08-31 NOTE — Progress Notes (Signed)
PT Cancellation Note  Patient Details Name: [name] MRN: [id number] DOB: November 02, 1924   Cancelled Treatment:    Reason Eval/Treat Not Completed: Medical issues which prohibited therapy (Heparin subtherapeutic, dopplers pending to r/o DVT. Will follow. )   Philomena Doheny 08/31/2016, 9:04 AM [phone]

## 2016-08-31 NOTE — Progress Notes (Signed)
Initial Nutrition Assessment  DOCUMENTATION CODES:   Non-severe (moderate) malnutrition in context of social or environmental circumstances  INTERVENTION:   Ensure Enlive po BID, each supplement provides 350 kcal and 20 grams of protein  After d/c:  Encouraged pt to have daughter bring her foods she enjoys Increase ensure to prevent further weight loss  NUTRITION DIAGNOSIS:   Malnutrition (Moderate) related to social / environmental circumstances as evidenced by energy intake < 75% for > or equal to 3 months, mild depletion of muscle mass, mild depletion of body fat, 4 percent weight loss x 3 months. GOAL:   Patient will meet greater than or equal to 90% of their needs  MONITOR:   PO intake, Supplement acceptance, Skin  REASON FOR ASSESSMENT:   Malnutrition Screening Tool    ASSESSMENT:   Pt presented with altered mental status found to have bilateral pulmonary emboli. PMH is significant for T2DM controlled, HLD, Iron deficiency anemia, HTN, CHF, asthma, GERD, OA, hx of breast ca, colon ca, history of suicidal ideation in March 2017, and depression.   Pt with a 4% weight loss x 3 months (loss of 5 lb). Pt reports this weight loss is because she is no longer cooking and moved in with her daughter who does not cook as much. She moved in with daughter in July after fall.   She has never been one to eat Breakfast, Lunch is broth from soup and an ensure, dinner is usually a meat, starch and veggie that daughter buys out. Prior to this she was cooking meals such as dry beans with a ham bone, spaghetti, etc. Pt does not feel that she is able to cook anymore but that potentially her other daughter could cook some and bring to her.  [facility] in room and reports that their service is attempting to get MOW for pt.  Discussed importance of preventing any further weight loss, states her PCP is concerned as well. Encouraged to drink more ensure to prevent further weight loss.   Medications  reviewed and include: dulcolax, KCl Labs reviewed: K+ 2.9 Nutrition-Focused physical exam completed. Findings are mild/moderate fat depletion, no muscle depletion, and no edema.     Diet Order:  Diet regular Room service appropriate? Yes; Fluid consistency: Thin  Skin:  Reviewed, no issues  Last BM:  10/17  Height:   Ht Readings from Last 1 Encounters:  08/30/16 4\' 11"  (1.499 m)    Weight:   Wt Readings from Last 1 Encounters:  08/31/16 110 lb (49.9 kg)    Ideal Body Weight:  44.6 kg  BMI:  Body mass index is 22.22 kg/m.  Estimated Nutritional Needs:   Kcal:  1300-1500  Protein:  60-70 grams  Fluid:  > 1.5 L/day  EDUCATION NEEDS:   Education needs addressed  Maylon Peppers RD, [place], [place] Pager [phone] After Hours Pager

## 2016-08-31 NOTE — Progress Notes (Signed)
[place] for IV heparin Indication: pulmonary embolus  Allergies  Allergen Reactions  . Lisinopril Cough    Patient Measurements: Height: 4\' 11"  (149.9 cm) Weight: 109 lb 8 oz (49.7 kg) IBW/kg (Calculated) : 43.2 Heparin Dosing Weight: 50 kg (TBW)  Vital Signs: Temp: 98.6 F (37 C) (10/18 2302) Temp Source: Oral (10/18 2302) BP: 163/59 (10/18 2302) Pulse Rate: 87 (10/18 2302)  Labs:  Recent Labs  08/30/16 1758 08/30/16 2113 08/31/16 0443  HGB 10.6*  --   --   HCT 31.8*  --   --   PLT 220  --   --   APTT  --  20*  --   LABPROT  --  14.9  --   INR  --  1.16  --   HEPARINUNFRC  --   --  0.29*  CREATININE 0.91  --   --     Estimated Creatinine Clearance: 28 mL/min (by C-G formula based on SCr of 0.91 mg/dL).  Assessment: 82 y.o. female with PE for heparin  Goal of Therapy: Heparin level 0.3-0.7 units/ml Monitor platelets by anticoagulation protocol: Yes  Plan: Increase Heparin 900 units/hr Check heparin level in 6 hours.   Phillis Knack, PharmD, BCPS  08/31/2016, 5:59 AM

## 2016-08-31 NOTE — Consult Note (Addendum)
[facility] Nurse wound consult note Reason for Consult: Heel wounds Wound type: pressure, unstageable Pressure Ulcer POA: Yes Measurement:R heel 3cm x 3.5cm x 0cm, 70% blackened area with 30% lighter darkened area.                          L heel stage II 2cm x 2cm x 0cm callused,                          peeling Wound bed:see above Drainage (amount, consistency, odor) none Periwound: dry Dressing procedure/placement/frequency: I have provided nurses with orders for Eucerin cream to both feet, foam dressings to bilateral heels, Prevalon Boots. We will not follow, but will remain available to this patient, to nursing, and the medical and/or surgical teams.  Please re-consult if we need to assist further.    Fara Olden, RN-C, WTA-C Wound Treatment Associate

## 2016-08-31 NOTE — Progress Notes (Signed)
Family Medicine Teaching Service Daily Progress Note Intern Pager: [phone]  [name] Medical record number: [id number] Date of birth: 02-28-1924 Age: 82 y.o. Gender: female  Primary Care Provider: Kathrine Cords, MD Consultants: None Code Status: DNR  Pt Overview and Major Events to Date:    Assessment and Plan: [name] is a 82 y.o. female presenting with altered mental status found to have bilateral pulmonary emboli. PMH is significant for T2DM controlled, HLD, Iron deficiency anemia, HTN, CHF, asthma, GERD, OA, hx of breast ca, colon ca, history of suicidal ideation in March 2017, and depression.   Pulmonary embolism.  CT Angio with bilateral lower lobe pulmonary emboli. Started on heparin.  Level this morning at 0443 was slightly subtherapeutic at 0.29.  Will recheck level at 1200.  Blood pressure elevated to 150/52, vital signs stable otherwise. Platelets dropped from 220>182, but WBC and RBC did as well.  Likely dilutional.  Will follow closely.  Discontinued heparin and started eliquis  - DC heparin and started eliquis 15mg  BID for 21 days and then will continue with 20mg  daily.  -cardiac monitoring -continuous pulse ox -PT/OT eval and treat -Tylenol 650 mg prn - f/u LE doppler  Hypokalemia: to 2.9.  Was 3.3 on admission. Will replete with 28meq BID. - repeat BMP  pneumonia, resolving.  Patient with recent diagnosis of PNA on a course of Doxycycline 100mg  po bid. Lungs clear.  -Continue Doxycycline 100 mg bid  asthma. Stable.  -Continue home albuterol neb prn  HTN.  Elevated BP on admission 180/79.  Was 150/52 this AM.  -Continue home Norvasc 5 mg daily -Continue home Coreg 12.5 mg bid -continue home Cozaar 50 mg daily -follow closely  Neuropathy. Stable.  -Continue home Gabapentin 300 mg bid  HLD -Continue home Crestor 20 mg daily  unstageable right heel ulcer.  Dry cracked and calloused heels bilaterally.  -apply moisturizing  lotion -support lower legs on pillow when laying to prevent heel from resting on mattress -wound care consult  suicidal ideation.  Actively suicidal March 2017, reported access to guns and large knife.  Living alone so was not supervised.  Was involuntarily committed to [facility] behavioral health unit for management.  -monitor  History of cancer.  Breast and colon ca.   -Tramadol 50 mg Q8 prn for pain  FEN/GI: Regular, IV NS @ KVO, Pepcid Prophylaxis: Heparin gtt  Disposition: discharge pending medical improvement  Subjective:  Feels great this morning.  No complaints. No CP, SOB, abd pain, nvd.   Objective: Temp:  [98.3 F (36.8 C)-100.4 F (38 C)] 98.3 F (36.8 C) (10/19 0611) Pulse Rate:  [74-102] 74 (10/19 0611) Resp:  [18] 18 (10/19 0611) BP: (142-180)/(52-93) 150/52 (10/19 0611) SpO2:  [95 %-99 %] 99 % (10/19 0611) Weight:  [109 lb 8 oz (49.7 kg)-110 lb (49.9 kg)] 110 lb (49.9 kg) (10/19 [id number]) Physical Exam: General: elderly female lying in hospital bed in no acute distress Eyes: EOMI, PERRL ENTM: MMM Neck: normal, supple Cardiovascular: RRR, no MRG, 2+ pulses Respiratory: CTA B/L, normal effort of breathing Gastrointestinal: soft, NT ND +bs, no masses MSK: no edema, no calf tenderness, right heel ulcer unstageable, skin overlying is dry, cracked, non-erythematous Derm: no rashes, no lesions noted Neuro: AAOx3, no focal deficits, 5/5 strength upper and lower extremities Psych: mood appropriate  Laboratory:  Recent Labs Lab 08/24/16 1500 08/30/16 1758 08/31/16 0443  WBC 5.0 8.1 5.7  HGB 10.5* 10.6* 8.7*  HCT 32.0* 31.8* 27.1*  PLT  198 220 182    Recent Labs Lab 08/24/16 1500 08/30/16 1758 08/31/16 0443  NA 135 136 139  K 3.4* 3.3* 2.9*  CL 102 105 110  CO2 26 23 23   BUN 14 12 10   CREATININE 0.89 0.91 0.79  CALCIUM 10.6* 10.5* 10.0  PROT  --  8.4*  --   BILITOT  --  0.8  --   ALKPHOS  --  50  --   ALT  --  10*  --   AST  --  19  --   GLUCOSE 235*  191* 132*    Imaging/Diagnostic Tests: Dg Chest 2 View  Result Date: 08/30/2016 CLINICAL DATA:  Altered mental status starting this morning EXAM: CHEST  2 VIEW COMPARISON:  08/24/2016 FINDINGS: Cardiomegaly is noted. Elevation of the right hemidiaphragm. No acute infiltrate or pleural effusion. No pulmonary edema. Degenerative changes thoracic spine. IMPRESSION: No active cardiopulmonary disease. Electronically Signed   By: Lahoma Crocker M.D.   On: 08/30/2016 17:09   Ct Angio Chest Pe W And/or Wo Contrast  Result Date: 08/30/2016 CLINICAL DATA:  Low oxygen saturation. Recently diagnosed with pneumonia. EXAM: CT ANGIOGRAPHY CHEST WITH CONTRAST TECHNIQUE: Multidetector CT imaging of the chest was performed using the standard protocol during bolus administration of intravenous contrast. Multiplanar CT image reconstructions and MIPs were obtained to evaluate the vascular anatomy. CONTRAST:  75 mL Isovue 370 IV COMPARISON:  01/21/2013 chest CT and same day chest radiograph FINDINGS: Cardiovascular: Acute nonocclusive pulmonary emboli are noted on the right, chest distal to the right main pulmonary artery extending into the right lower lobe lobar, segmental and subsegmental pulmonary arteries. Small subsegmental Edit occlusive left lower lobe pulmonary embolus is also identified. The RV to LV ratio is approximately 0.84. The heart is top normal. Coronary arteriosclerosis. No pericardial effusion. Aortic atherosclerosis. No aneurysm. Mediastinum/Nodes: 1 cm short axis right lower paratracheal lymph node. Similar sized aorticopulmonary short axis lymph nodes. Small right hilar lymph nodes. Small hiatal hernia with mild-to-moderate diffuse thickening of the esophagus possibly inflammatory. Lungs/Pleura: No pneumonic consolidations. Lingular, right middle lobe and left upper lobe atelectasis. Upper Abdomen: Mild thickening of the left adrenal apex. No acute abnormality. Musculoskeletal: Degenerative disc disease  along the visualized dorsal spine. No acute osseous abnormality. Review of the MIP images confirms the above findings. IMPRESSION: Bilateral lower lobe pulmonary emboli, nonocclusive involving right lobar through subsegmental pulmonary arteries to the right lower lobe and occlusive subsegmental left lower lobe pulmonary arteries. The RV to LV ratio slightly under the cutoff for submassive PE. Critical Value/emergent results were called by telephone at the time of interpretation on 08/30/2016 at 7:53 pm to Dr. Shirlyn Goltz , who verbally acknowledged these results. Electronically Signed   By: Ashley Royalty M.D.   On: 08/30/2016 19:54    Eloise Levels, MD 08/31/2016, 11:53 AM PGY-1, Portal Intern pager: [phone], text pages welcome

## 2016-09-01 ENCOUNTER — Encounter (HOSPITAL_COMMUNITY): Payer: Self-pay

## 2016-09-01 ENCOUNTER — Encounter (HOSPITAL_COMMUNITY): Payer: Medicare Other

## 2016-09-01 ENCOUNTER — Inpatient Hospital Stay (HOSPITAL_COMMUNITY): Payer: Medicare Other

## 2016-09-01 DIAGNOSIS — I2699 Other pulmonary embolism without acute cor pulmonale: Principal | ICD-10-CM

## 2016-09-01 DIAGNOSIS — R112 Nausea with vomiting, unspecified: Secondary | ICD-10-CM

## 2016-09-01 LAB — GLUCOSE, CAPILLARY
GLUCOSE-CAPILLARY: 123 mg/dL — AB (ref 65–99)
Glucose-Capillary: 191 mg/dL — ABNORMAL HIGH (ref 65–99)
Glucose-Capillary: 289 mg/dL — ABNORMAL HIGH (ref 65–99)
Glucose-Capillary: 256 mg/dL — ABNORMAL HIGH (ref 65–99)

## 2016-09-01 LAB — URINE CULTURE: CULTURE: NO GROWTH

## 2016-09-01 LAB — BASIC METABOLIC PANEL
Anion gap: 5 (ref 5–15)
BUN: 10 mg/dL (ref 6–20)
CHLORIDE: 108 mmol/L (ref 101–111)
CO2: 24 mmol/L (ref 22–32)
CREATININE: 0.89 mg/dL (ref 0.44–1.00)
Calcium: 10.1 mg/dL (ref 8.9–10.3)
GFR calc Af Amer: >60 mL/min (ref >60)
GFR calc non Af Amer: 55 mL/min — ABNORMAL LOW (ref >60)
GLUCOSE: 140 mg/dL — AB (ref 65–99)
POTASSIUM: 3.6 mmol/L (ref 3.5–5.1)
Sodium: 137 mmol/L (ref 135–145)

## 2016-09-01 NOTE — Discharge Summary (Signed)
[facility] Discharge Summary  [name] Medical record number: [id number] Date of birth: [birth year] Age: 82 y.o. Gender: female Date of Admission: 08/30/2016  Date of Discharge: 09/04/16 Admitting Physician: Dickie La, MD  Primary Care Provider: Kathrine Cords, MD Consultants: None  Indication for Hospitalization: Altered mental status  Discharge Diagnoses/Problem List:  Patient Active Problem List   Diagnosis Date Noted  . Pressure injury of skin 08/31/2016  . Pulmonary embolus ([place]) 08/30/2016  . Mild dementia 06/01/2016  . Depression 01/20/2016  . Suicidal ideation 01/19/2016  . Prolapse of female pelvic organs 12/28/2015  . Leg weakness, bilateral 12/08/2015  . Weakness of right lower extremity 11/25/2015  . (HFpEF) heart failure with preserved ejection fraction ([place])   . CAD in native artery   . Gout 05/14/2015  . Dyslipidemia   . Essential hypertension   . Seasonal allergies 02/10/2015  . Stroke-like symptoms   . Stroke-like episode ([place]) 01/25/2015  . Syncope and collapse 01/25/2015  . Anemia of chronic illness 08/19/2014  . Acute superficial venous thrombosis of right lower extremity 08/19/2014  . Hypercalcemia 06/09/2014  . Breast cancer, left breast ([place]) 06/09/2014  . Breast cancer, right breast ([place]) 06/09/2014  . Edema of left lower extremity 03/30/2014  . PAD (peripheral artery disease) ([place]) 03/30/2014  . History of colon cancer 12/11/2013  . History of fall 05/15/2013  . Bilateral leg pain 03/07/2013  . Lower extremity edema 02/19/2013  . Stricture and stenosis of esophagus 10/27/2012  . Thoracic back pain 05/24/2012  . Tobacco abuse 06/02/2011  . History of breast cancer in female, bilateral.  Mastectomies 10/24/2010. 04/26/2011  . Hiatal hernia 03/22/2011  . Colon polyp 03/22/2011  . Poor circulation 03/22/2011  . 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 NEUROPATHY 08/25/2009  . ANEMIA-IRON  DEFICIENCY 04/29/2007  . HLD (hyperlipidemia) 01/29/2007  . Diabetes type 2, controlled ([place]) 01/10/2007  . HYPERTENSION, BENIGN SYSTEMIC 01/10/2007  . Coronary atherosclerosis 01/10/2007  . Chronic systolic heart failure ([place]) 01/10/2007  . Asthma 01/10/2007  . Reflux esophagitis 01/10/2007  . Osteoarthritis, multiple sites 01/10/2007    Disposition: Discharged to SNF  Discharge Condition: Stable, improved  Discharge Exam:  General: elderly female lying in hospital bed in no acute distress Eyes: EOMI, PERRL ENTM: MMM Neck: normal, supple Cardiovascular: RRR, no MRG, 2+ pulses Respiratory: CTA B/L, normal effort of breathing Gastrointestinal: soft, NT ND +bs, no masses MSK: no edema, no calf tenderness, right heel ulcer unstageable, skin overlying is dry, cracked, non-erythematous Derm: covered ulcer on right heel Neuro: AAOx3, no focal deficits, 5/5 strength upper and lower extremities Psych: mood appropriate  Brief Hospital Course:  [name] is a 82 y.o. female presenting with altered mental status found to have bilateral pulmonary emboli. PMH is significant for T2DM controlled, HLD, Iron deficiency anemia, HTN, CHF, asthma, GERD, OA, hx of breast ca, colon ca, history of suicidal ideation in March 2017, and depression.   Patient was brought to ED by ambulance for AMS. Initially her oxygen saturation on room air was 87% per EMS and improved to 98% on 2 L. Chest x-ray in the emergency department showed elevation of the right hemidiaphragm with no acute infiltrate or pleural effusion. CTA showed bilateral lower lobe pulmonary emboli and was started on therapeutic heparin. She had no leg pain indicative of DVT source. Patient does have history of cancer both colon and breast which increases clot burden, but both have resolved per recent oncology notes. Patient  was transitioned over to Eliquis 10 mg twice a day with plans to continue this for 7 days, and then proceed with 5 mg twice  a day thereafter for the next 6 months.  She was seen by both physical therapy and occupational therapy both of whom recommended SNF. At the time of discharge patient was on room air and was without pain and tolerating food well.  She was stating that she wanted to be discharged to skilled nursing facility upon discharge.    Also of note, patient had elevated glucose in 200-300 range. She was initially placed on sliding scale insulin. Per patient request, all insulin and CBG checks were stopped and patient was not sent out on any diabetic medications.   Issues for Follow Up:  1. Bilateral PE on anticoagulation: Currently on Eliquis 10 mg twice a day with plans to continue for total of 7 day course and then transition to 5 mg twice a day for 6 months.  2. Pneumonia: Patient admitted on course of doxycycline for treatment of pneumonia.  Showing improvement, should follow up with PCP 3. Hypokalemia: Was as low as 2.9, up to 3.6 with replacement. Would be appropriate to recheck BMP at next visit. 4. Can check glucose as this was high during admission, however patient not agreeable to any diabetic medications  Significant Procedures: None  Significant Labs and Imaging:   Recent Labs Lab 08/30/16 1758 08/31/16 0443  WBC 8.1 5.7  HGB 10.6* 8.7*  HCT 31.8* 27.1*  PLT 220 182    Recent Labs Lab 08/30/16 1758 08/31/16 0443 09/01/16 0829  NA 136 139 137  K 3.3* 2.9* 3.6  CL 105 110 108  CO2 '23 23 24  '$ GLUCOSE 191* 132* 140*  BUN '12 10 10  '$ CREATININE 0.91 0.79 0.89  CALCIUM 10.5* 10.0 10.1  MG  --  1.7  --   ALKPHOS 50  --   --   AST 19  --   --   ALT 10*  --   --   ALBUMIN 3.5  --   --    No results found.  Results/Tests Pending at Time of Discharge: None  Discharge Medications:    Medication List    STOP taking these medications   clopidogrel 75 MG tablet Commonly known as:  PLAVIX   doxycycline 100 MG capsule Commonly known as:  VIBRAMYCIN     TAKE these medications    acetaminophen 500 MG tablet Commonly known as:  TYLENOL Take 500 mg by mouth every 6 (six) hours as needed for moderate pain (pain).   albuterol 108 (90 Base) MCG/ACT inhaler Commonly known as:  PROVENTIL HFA;VENTOLIN HFA Inhale 2 puffs into the lungs every 6 (six) hours as needed. For shortness of breath   amLODipine 5 MG tablet Commonly known as:  NORVASC TAKE 1 TABLET (5 MG TOTAL) BY MOUTH DAILY.   apixaban 5 MG Tabs tablet Commonly known as:  ELIQUIS Take 2 tablets (10 mg total) by mouth 2 (two) times daily.   apixaban 5 MG Tabs tablet Commonly known as:  ELIQUIS Take 1 tablet (5 mg total) by mouth 2 (two) times daily. Start taking on:  09/07/2016   ASPIRIN CHILDRENS 81 MG chewable tablet Generic drug:  aspirin Chew 81 mg by mouth daily.   BIOFREEZE EX Apply 1 application topically 2 (two) times daily as needed (pain).   bisacodyl 5 MG EC tablet Generic drug:  bisacodyl Take 5 mg by mouth every 3 (three) days.  Blood Pressure Kit Use the blood pressure kit to check you blood pressure twice a day   capsaicin 0.025 % cream Commonly known as:  ZOSTRIX APPLY TO AFFECTED AREA TWICE A DAY   carvedilol 12.5 MG tablet Commonly known as:  COREG Take 1 tablet (12.5 mg total) by mouth 2 (two) times daily with a meal.   diclofenac sodium 1 % Gel Commonly known as:  VOLTAREN APPLY 2 grams topically 4 TIMES DAILY AS NEEDED FOR PAIN   gabapentin 300 MG capsule Commonly known as:  NEURONTIN Take 1 capsule (300 mg total) by mouth 2 (two) times daily.   glucose blood test strip Use as instructed Dx. Code 250.02   guaifenesin 100 MG/5ML syrup Commonly known as:  ROBITUSSIN Take 10 mLs (200 mg total) by mouth 3 (three) times daily as needed for cough.   loratadine 10 MG tablet Commonly known as:  CLARITIN TAKE 1/2 TABLET BY MOUTH DAILY What changed:  See the new instructions.   losartan 50 MG tablet Commonly known as:  COZAAR TAKE 1 TABLET (50 MG TOTAL) BY MOUTH AT  BEDTIME. What changed:  how much to take  how to take this  when to take this  additional instructions   PATADAY 0.2 % Soln Generic drug:  Olopatadine HCl APPLY 1 DROP TO EYE DAILY. What changed:  See the new instructions.   ranitidine 150 MG capsule Commonly known as:  ZANTAC TAKE ONE CAPSULE BY MOUTH TWICE A DAY AS NEEDED HEARTBURN What changed:  See the new instructions.   rosuvastatin 20 MG tablet Commonly known as:  CRESTOR Take 1 tablet (20 mg total) by mouth at bedtime.   traMADol 50 MG tablet Commonly known as:  ULTRAM Take 1 tablet (50 mg total) by mouth every 8 (eight) hours as needed for severe pain.       Discharge Instructions: Please refer to Patient Instructions section of EMR for full details.  Patient was counseled important signs and symptoms that should prompt return to medical care, changes in medications, dietary instructions, activity restrictions, and follow up appointments.   Follow-Up Appointments:   Carlyle Dolly, MD 09/04/2016, 12:58 PM PGY-2, [place]

## 2016-09-01 NOTE — Evaluation (Signed)
Physical Therapy Evaluation Patient Details Name: [name] MRN: [id number] DOB: 14-Jun-1924 Today's Date: 09/01/2016   History of Present Illness  82 y.o. female presenting with altered mental status found to have bilateral pulmonary emboli. PMH is significant for T2DM controlled, HLD, Iron deficiency anemia, HTN, CHF, asthma, GERD, OA, hx of breast ca, colon ca, history of suicidal ideation in March 2017, and depression.     Clinical Impression  Pt admitted with above diagnosis. Patient with significant decline in function over past 2-3 weeks (per pt) and is very motivated to regain the ability to walk (or learn to use a wheelchair if unable to walk due to heel sores). Pt currently with functional limitations due to the deficits listed below (see PT Problem List).  Pt will benefit from skilled PT to increase their independence and safety with mobility to allow discharge to the venue listed below.       Follow Up Recommendations SNF;Supervision/Assistance - 24 hour    Equipment Recommendations   (? rollator; TBA at next venue)    Recommendations for Other Services       Precautions / Restrictions Precautions Precautions: Fall Restrictions Weight Bearing Restrictions: No      Mobility  Bed Mobility Overal bed mobility: Needs Assistance Bed Mobility: Supine to Sit;Sit to Supine     Supine to sit: HOB elevated;Min guard Sit to supine: Min assist   General bed mobility comments: incr time and effort with use of rail to come to EOB; assist to raise legs onto bed  Transfers Overall transfer level: Needs assistance Equipment used: Rolling walker (2 wheeled) Transfers: Sit to/from [facility] Sit to Stand: Mod assist;From elevated surface Stand pivot transfers: Mod assist;From elevated surface       General transfer comment: vc for safe use of RW; posterior lean with each transfer (x 2)  Ambulation/Gait             General Gait Details: pivotal  steps to [facility] only due to posterior lean  Stairs            Wheelchair Mobility    Modified Rankin (Stroke Patients Only)       Balance Overall balance assessment: Needs assistance Sitting-balance support: No upper extremity supported;Feet unsupported Sitting balance-Leahy Scale: Fair   Postural control: Posterior lean Standing balance support: Bilateral upper extremity supported Standing balance-Leahy Scale: Poor                               Pertinent Vitals/Pain Pain Assessment: No/denies pain    Home Living Family/patient expects to be discharged to:: [place]: Children (Per pt. her daughter does not help. Pt.wants to go to SNF.) Available Help at Discharge: Family;Available PRN/intermittently         Home Layout: Two level;Bed/bath upstairs Home Equipment: Walker - 2 wheels;Bedside commode Additional Comments: Pt took sink bath    Prior Function Level of Independence: Needs assistance   Gait / Transfers Assistance Needed: Requires heels have hurt too much in past 2-3 weeks to walk much. Usually uses RW  ADL's / Homemaking Assistance Needed:  (Pt. has [facility] aide for ADLs but was I with toileting)  Comments: Pt. states she is too weak to go home secondary to dtr does not help her .l     Hand Dominance   Dominant Hand: Right    Extremity/Trunk Assessment   Upper Extremity Assessment: Generalized weakness  Lower Extremity Assessment: Generalized weakness (bil ankle DF -10 from neutral)      Cervical / Trunk Assessment: Kyphotic  Communication   Communication: No difficulties  Cognition Arousal/Alertness: Awake/alert Behavior During Therapy: WFL for tasks assessed/performed Overall Cognitive Status: Within Functional Limits for tasks assessed                      General Comments General comments (skin integrity, edema, etc.): Has bil Prevalon boots and foam dressings to her heels  due to skin breakdown    Exercises     Assessment/Plan    PT Assessment Patient needs continued PT services  PT Problem List Decreased strength;Decreased range of motion;Decreased balance;Decreased mobility;Decreased knowledge of use of DME;Decreased safety awareness          PT Treatment Interventions DME instruction;Gait training;Functional mobility training;Therapeutic activities;Therapeutic exercise;Balance training;Patient/family education    PT Goals (Current goals can be found in the Care Plan section)  Acute Rehab PT Goals Patient Stated Goal: I want to walk again PT Goal Formulation: With patient Time For Goal Achievement: 09/15/16 Potential to Achieve Goals: Good    Frequency Min 2X/week   Barriers to discharge Decreased caregiver support      Co-evaluation               End of Session Equipment Utilized During Treatment: Gait belt Activity Tolerance: Patient tolerated treatment well Patient left: in bed;with call bell/phone within reach;with bed alarm set;Other (comment) (bil Prevalon in place) Nurse Communication: Mobility status;Other (comment) (+BM)         Time: [id number] PT Time Calculation (min) (ACUTE ONLY): 24 min   Charges:   PT Evaluation $PT Eval Low Complexity: 1 Procedure PT Treatments $Therapeutic Activity: 8-22 mins   PT G Codes:        Guinn Delarosa 09/16/16, 2:24 PM  Pager [phone]

## 2016-09-01 NOTE — Progress Notes (Signed)
Family Medicine Teaching Service Daily Progress Note Intern Pager: [phone]  [name] Medical record number: [id number] Date of birth: 03-29-1924 Age: 82 y.o. Gender: female  Primary Care Provider: Kathrine Cords, MD Consultants: None Code Status: DNR  Pt Overview and Major Events to Date:    Assessment and Plan: [name] is a 82 y.o. female presenting with altered mental status found to have bilateral pulmonary emboli. PMH is significant for T2DM controlled, HLD, Iron deficiency anemia, HTN, CHF, asthma, GERD, OA, hx of breast ca, colon ca, history of suicidal ideation in March 2017, and depression.   Pulmonary embolism.  CT Angio with bilateral lower lobe pulmonary emboli. Started on heparin, transitioned onto eliquis 10mg  BID.  LE doppler is negative.  - continue eliquis 10mg  BID for 7 days and then transition to 5mg  BID for 73mo due to unprovoked PE -cardiac monitoring -continuous pulse ox -PT/OT eval and treat -Tylenol 650 mg prn  Hypokalemia: up to 3.6 from 29 s/p replacement - f/u BMP  pneumonia, resolving.  Patient with recent diagnosis of PNA on a course of Doxycycline 100mg  po bid. Lungs clear.  Endorsing productive cough.  Afebrile. No leukocytosis.  -Continue Doxycycline 100 mg bid  asthma. Stable.  -Continue home albuterol neb prn  HTN.  Elevated BP on admission 180/79.  Was 150/60 this AM.  -Continue home Norvasc 5 mg daily -Continue home Coreg 12.5 mg bid -continue home Cozaar 50 mg daily -follow closely  Neuropathy. Stable.  -Continue home Gabapentin 300 mg bid  HLD -Continue home Crestor 20 mg daily  unstageable right heel ulcer.  Dry cracked and calloused heels bilaterally.  -apply moisturizing lotion -support lower legs on pillow when laying to prevent heel from resting on mattress -wound care consult  suicidal ideation.  Actively suicidal March 2017, reported access to guns and large knife.  Living alone so was not  supervised.  Was involuntarily committed to [facility] behavioral health unit for management.  -monitor  History of cancer.  Breast and colon ca.   -Tramadol 50 mg Q8 prn for pain  FEN/GI: Regular, IV NS @ KVO, Pepcid Prophylaxis: Heparin gtt  Disposition: discharge to SNF pending medical improvement   Subjective:   No complaints. Per Dr. Libby Maw note, patient wants to be discharged to SNF.  Confirmed with patient this morning.   Objective: Temp:  [98.6 F (37 C)-99.5 F (37.5 C)] 98.6 F (37 C) (10/20 0100) Pulse Rate:  [72-86] 72 (10/20 0957) Resp:  [17-18] 17 (10/20 0100) BP: (150-181)/(59-62) 158/62 (10/20 0957) SpO2:  [96 %-99 %] 99 % (10/20 0957) Physical Exam: General: elderly female lying in hospital bed in no acute distress Eyes: EOMI, PERRL ENTM: MMM Neck: normal, supple Cardiovascular: RRR, no MRG, 2+ pulses Respiratory: CTA B/L, normal effort of breathing Gastrointestinal: soft, NT ND +bs, no masses MSK: no edema, no calf tenderness, right heel ulcer unstageable, skin overlying is dry, cracked, non-erythematous Derm: no rashes, no lesions noted Neuro: AAOx3, no focal deficits, 5/5 strength upper and lower extremities Psych: mood appropriate  Laboratory:  Recent Labs Lab 08/30/16 1758 08/31/16 0443  WBC 8.1 5.7  HGB 10.6* 8.7*  HCT 31.8* 27.1*  PLT 220 182    Recent Labs Lab 08/30/16 1758 08/31/16 0443 09/01/16 0829  NA 136 139 137  K 3.3* 2.9* 3.6  CL 105 110 108  CO2 23 23 24   BUN 12 10 10   CREATININE 0.91 0.79 0.89  CALCIUM 10.5* 10.0 10.1  PROT 8.4*  --   --  BILITOT 0.8  --   --   ALKPHOS 50  --   --   ALT 10*  --   --   AST 19  --   --   GLUCOSE 191* 132* 140*    Imaging/Diagnostic Tests: Dg Chest 2 View  Result Date: 08/30/2016 CLINICAL DATA:  Altered mental status starting this morning EXAM: CHEST  2 VIEW COMPARISON:  08/24/2016 FINDINGS: Cardiomegaly is noted. Elevation of the right hemidiaphragm. No acute infiltrate or pleural  effusion. No pulmonary edema. Degenerative changes thoracic spine. IMPRESSION: No active cardiopulmonary disease. Electronically Signed   By: Lahoma Crocker M.D.   On: 08/30/2016 17:09   Ct Angio Chest Pe W And/or Wo Contrast  Result Date: 08/30/2016 CLINICAL DATA:  Low oxygen saturation. Recently diagnosed with pneumonia. EXAM: CT ANGIOGRAPHY CHEST WITH CONTRAST TECHNIQUE: Multidetector CT imaging of the chest was performed using the standard protocol during bolus administration of intravenous contrast. Multiplanar CT image reconstructions and MIPs were obtained to evaluate the vascular anatomy. CONTRAST:  75 mL Isovue 370 IV COMPARISON:  01/21/2013 chest CT and same day chest radiograph FINDINGS: Cardiovascular: Acute nonocclusive pulmonary emboli are noted on the right, chest distal to the right main pulmonary artery extending into the right lower lobe lobar, segmental and subsegmental pulmonary arteries. Small subsegmental Edit occlusive left lower lobe pulmonary embolus is also identified. The RV to LV ratio is approximately 0.84. The heart is top normal. Coronary arteriosclerosis. No pericardial effusion. Aortic atherosclerosis. No aneurysm. Mediastinum/Nodes: 1 cm short axis right lower paratracheal lymph node. Similar sized aorticopulmonary short axis lymph nodes. Small right hilar lymph nodes. Small hiatal hernia with mild-to-moderate diffuse thickening of the esophagus possibly inflammatory. Lungs/Pleura: No pneumonic consolidations. Lingular, right middle lobe and left upper lobe atelectasis. Upper Abdomen: Mild thickening of the left adrenal apex. No acute abnormality. Musculoskeletal: Degenerative disc disease along the visualized dorsal spine. No acute osseous abnormality. Review of the MIP images confirms the above findings. IMPRESSION: Bilateral lower lobe pulmonary emboli, nonocclusive involving right lobar through subsegmental pulmonary arteries to the right lower lobe and occlusive subsegmental  left lower lobe pulmonary arteries. The RV to LV ratio slightly under the cutoff for submassive PE. Critical Value/emergent results were called by telephone at the time of interpretation on 08/30/2016 at 7:53 pm to Dr. Shirlyn Goltz , who verbally acknowledged these results. Electronically Signed   By: Ashley Royalty M.D.   On: 08/30/2016 19:54    Eloise Levels, MD 09/01/2016, 12:33 PM PGY-1, [place] Intern pager: [phone], text pages welcome

## 2016-09-01 NOTE — Progress Notes (Signed)
*  PRELIMINARY RESULTS* Vascular Ultrasound Lower extremity venous duplex has been completed.  Preliminary findings: no evidence of DVT in visualized veins.  Landry Mellow, RDMS, RVT  09/01/2016, 10:59 AM

## 2016-09-01 NOTE — Progress Notes (Signed)
Patients blood sugar at 11:50 am was 191. Paged Family Medicine doctor regarding result.  Was advised that we should keep monitor ptatinets blood sugar. No insulin or other treatments advised at this time.   Cassia Fein, RN

## 2016-09-01 NOTE — Progress Notes (Signed)
Nurse Tech called nursing staff to room stating that patient was sitting on recliner leg rest. Patient stated that she failed but when asked did she hit the floor patient stated "no". Patient is not complaining of pain, no abrasions, bruises seen on skin. Patient stable at this time and assisted back to the bed. Bed alarm on. Patient previously transfered from bed to chair by OT without chair alarm in place and chair unlocked. Will continue to monitor. MD notified.  Maylani Embree, RN

## 2016-09-01 NOTE — Evaluation (Signed)
Occupational Therapy Evaluation Patient Details Name: [name] MRN: [id number] DOB: [birth year] Today's Date: 09/01/2016    History of Present Illness Pt. was admitted for B PEs and was placed on heparen. Pt. had decresed mental status an dhypoxia when admitted to [place] but cognintion has improved. Pt. PMH: DM, HTN, CHF, asthma, OA, breast CA, colon CA, depression.    Clinical Impression   Pt. States that she has to access second floor of home for bathroom and bedroom. Pt. States she is too weak to go home and wants to go to SNF. Pt. States that dtr. Does not help her at home and she is not able to take care of herself at this time. Pt. Has [facility] aide that is their only in AM. Pt is motivated to work with OT to increas I and safety with ADLs and mobility.     Follow Up Recommendations  SNF    Equipment Recommendations  3 in 1 bedside comode    Recommendations for Other Services       Precautions / Restrictions Precautions Precautions: Fall Restrictions Weight Bearing Restrictions: No      Mobility Bed Mobility Overal bed mobility: Needs Assistance Bed Mobility: Supine to Sit     Supine to sit: Min assist        Transfers Overall transfer level: Needs assistance Equipment used: Standard walker Transfers: Sit to/from Stand;Stand Pivot Transfers Sit to Stand: Mod assist Stand pivot transfers: Mod assist       General transfer comment: Pt. requried cues for perop hand placemtn.     Balance                                            ADL Overall ADL's : Needs assistance/impaired Eating/Feeding: Independent   Grooming: Wash/dry hands;Wash/dry face;Supervision/safety;Set up   Upper Body Bathing: Supervision/ safety;Set up   Lower Body Bathing: Minimal assistance;Moderate assistance;Sit to/from stand   Upper Body Dressing : Supervision/safety;Set up   Lower Body Dressing: Moderate assistance;Sit to/from stand   Toilet Transfer:  Moderate assistance   Toileting- Clothing Manipulation and Hygiene: Moderate assistance;Maximal assistance       Functional mobility during ADLs: Moderate assistance;Maximal assistance General ADL Comments: Pt. would benefit from ed on AE to increase I with LE ADLS.      Vision     Perception     Praxis      Pertinent Vitals/Pain Pain Assessment: No/denies pain     Hand Dominance Right   Extremity/Trunk Assessment Upper Extremity Assessment Upper Extremity Assessment: Generalized weakness           Communication Communication Communication: No difficulties   Cognition Arousal/Alertness: Awake/alert Behavior During Therapy: WFL for tasks assessed/performed Overall Cognitive Status: Within Functional Limits for tasks assessed                     General Comments       Exercises       Shoulder Instructions      Home Living Family/patient expects to be discharged to:: Skilled nursing facility Living Arrangements: Children (Per pt. her daughter does not help. Pt.wants to go to SNF.) Available Help at Discharge: Family;Available PRN/intermittently         Home Layout: Two level;Bed/bath upstairs     Bathroom Shower/Tub: Tub/shower unit Shower/tub characteristics: Architectural technologist: Standard Bathroom Accessibility: Yes  Home Equipment: [place] - 2 wheels;Bedside commode   Additional Comments: Pt took sink bath      Prior Functioning/Environment Level of Independence: Needs assistance    ADL's / Homemaking Assistance Needed:  (Pt. has [place] aide for ADLs but was I with toileting)   Comments: Pt. states she is too weak to go home secondary to dtr does not help her .l        OT Problem List: Decreased strength;Decreased activity tolerance;Impaired balance (sitting and/or standing);Decreased knowledge of use of DME or AE   OT Treatment/Interventions: Self-care/ADL training;DME and/or AE instruction;Therapeutic activities;Balance training     OT Goals(Current goals can be found in the care plan section) Acute Rehab OT Goals Patient Stated Goal:  (go to SNF for rehab.) OT Goal Formulation: With patient Time For Goal Achievement: 09/15/16 Potential to Achieve Goals: Good ADL Goals Pt Will Perform Grooming: with supervision;standing Pt Will Perform Upper Body Bathing: with modified independence;sitting Pt Will Perform Lower Body Bathing: with set-up;with supervision;sit to/from stand Pt Will Perform Upper Body Dressing: with modified independence;sitting Pt Will Perform Lower Body Dressing: with set-up;with supervision;sit to/from stand Pt Will Transfer to Toilet: with modified independence;bedside commode Pt Will Perform Toileting - Clothing Manipulation and hygiene: with modified independence;sit to/from stand  OT Frequency: Min 2X/week   Barriers to D/C: Decreased caregiver support   (Pt. is too weak  to climb stairs and dtr does not help her)       Co-evaluation              End of Session Equipment Utilized During Treatment: Gait belt;Rolling walker Nurse Communication:  (Pt. nurse stated that pt. was OK for therapy )  Activity Tolerance: Patient tolerated treatment well Patient left: in chair;with call bell/phone within reach   Time: 1113-1151 OT Time Calculation (min): 38 min Charges:  OT General Charges $OT Visit: 1 Procedure OT Evaluation $OT Eval Moderate Complexity: 1 Procedure OT Treatments $Self Care/Home Management : 23-37 mins G-Codes:    [name] Sep 07, 2016, 11:52 AM

## 2016-09-02 LAB — GLUCOSE, CAPILLARY
GLUCOSE-CAPILLARY: 260 mg/dL — AB (ref 65–99)
GLUCOSE-CAPILLARY: 121 mg/dL — AB (ref 65–99)
Glucose-Capillary: 320 mg/dL — ABNORMAL HIGH (ref 65–99)

## 2016-09-02 MED ORDER — INSULIN ASPART 100 UNIT/ML ~~LOC~~ SOLN
0.0000 [IU] | Freq: Three times a day (TID) | SUBCUTANEOUS | Status: DC
Start: 2016-09-02 — End: 2016-09-03
  Administered 2016-09-02: 7 [IU] via SUBCUTANEOUS
  Administered 2016-09-03: 1 [IU] via SUBCUTANEOUS

## 2016-09-02 NOTE — Progress Notes (Addendum)
Paged by RN. Glucoses noted to be 320, has been in the high 200's today. Will place an order for sensitive sliding scale insulin and monitor glucose. Appreciate excellent care from RN.   Smitty Cords, MD [place], PGY-2

## 2016-09-02 NOTE — Progress Notes (Signed)
Family Medicine Teaching Service Daily Progress Note Intern Pager: [phone]  [name] Medical record number: [id number] Date of birth: April 23, 1924 Age: 82 y.o. Gender: female  Primary Care Provider: Kathrine Cords, MD Consultants: None Code Status: DNR  Assessment and Plan: [name] is a 82 y.o. female presenting with altered mental status found to have bilateral pulmonary emboli. PMH is significant for T2DM controlled, HLD, Iron deficiency anemia, HTN, CHF, asthma, GERD, OA, hx of breast ca, colon ca, history of suicidal ideation in March 2017, and depression.   Pulmonary embolism.  CT Angio with bilateral lower lobe pulmonary emboli. Started on heparin, transitioned onto eliquis 10mg  BID.  LE doppler is negative. Vital signs stable, breathing normally on on room air with O2 sats in the high 90's.  -continue eliquis 10mg  BID for 7 days and then transition to 5mg  BID for 46mo due to unprovoked PE -Stop cardiac monitoring -Stop continuous pulse ox -PT/OT eval and treat--> recommend SNF, patient is agreeable -Tylenol 650 mg prn  Pneumonia, resolving.  Patient with recent diagnosis of PNA on a course of Doxycycline 100mg  po bid. Lungs clear.  Endorsing productive cough. Afebrile. No leukocytosis.  -Continue Doxycycline 100 mg bid  Asthma. Stable.  -Continue home albuterol neb prn  HTN.  Elevated BP on admission 180/79.  Was 150/60 this AM.  -Continue home Norvasc 5 mg daily -Continue home Coreg 12.5 mg bid -continue home Cozaar 50 mg daily -follow closely  Neuropathy. Stable.  -Continue home Gabapentin 300 mg bid  HLD -Continue home Crestor 20 mg daily  Unstageable right heel ulcer.  Dry cracked and calloused heels bilaterally.  -apply moisturizing lotion -support lower legs on pillow when laying to prevent heel from resting on mattress -wound care consult  Previous suicidal ideation.  Actively suicidal March 2017, reported access to guns and  large knife.  Living alone so was not supervised.  Was involuntarily committed to [facility] behavioral health unit for management.  -monitor mental status  History of cancer.  Breast and colon ca.   -Tramadol 50 mg Q8 prn for pain  Hypokalemia: Resolved yesterday   Elevated glucose: Glucose ranged in 100-200's. [place] for now. Would not start any insulin with those CBGs given age.  - Continue to monitor  FEN/GI: Regular, IV NS @ KVO, Pepcid Prophylaxis: Heparin gtt  Disposition: Skilled nursing facility when able  Subjective:  No acute events overnight, vital signs remained stable. Patient is breathing normally on room air. She has no complaints this morning states she is tolerating her diet just fine. She is ready to go to a skilled nursing facility when able  Objective: Temp:  [98 F (36.7 C)-98.8 F (37.1 C)] 98 F (36.7 C) (10/21 0800) Pulse Rate:  [68-79] 68 (10/21 0800) Resp:  [18] 18 (10/21 0800) BP: (132-158)/(54-75) 150/55 (10/21 0800) SpO2:  [97 %-100 %] 100 % (10/21 0800) Weight:  [112 lb (50.8 kg)] 112 lb (50.8 kg) (10/21 0604) Physical Exam: General: elderly female sitting up in chair watching TV Eyes: EOMI, PERRL ENTM: MMM Neck: normal, supple Cardiovascular: RRR, no MRG, 2+ pulses Respiratory: CTA B/L, normal effort of breathing Gastrointestinal: soft, NT ND +bs, no masses MSK: no edema, no calf tenderness, right heel ulcer unstageable, skin overlying is dry, cracked, non-erythematous Derm: no rashes, no lesions noted Neuro: AAOx3, no focal deficits, 5/5 strength upper and lower extremities Psych: mood appropriate  Laboratory:  Recent Labs Lab 08/30/16 1758 08/31/16 0443  WBC 8.1 5.7  HGB 10.6*  8.7*  HCT 31.8* 27.1*  PLT 220 182    Recent Labs Lab 08/30/16 1758 08/31/16 0443 09/01/16 0829  NA 136 139 137  K 3.3* 2.9* 3.6  CL 105 110 108  CO2 23 23 24   BUN 12 10 10   CREATININE 0.91 0.79 0.89  CALCIUM 10.5* 10.0 10.1  PROT 8.4*  --   --   BILITOT  0.8  --   --   ALKPHOS 50  --   --   ALT 10*  --   --   AST 19  --   --   GLUCOSE 191* 132* 140*    Imaging/Diagnostic Tests: No results found.  Carlyle Dolly, MD 09/02/2016, 8:47 AM PGY-2, [place] Intern pager: [phone], text pages welcome

## 2016-09-03 LAB — GLUCOSE, CAPILLARY
GLUCOSE-CAPILLARY: 135 mg/dL — AB (ref 65–99)
GLUCOSE-CAPILLARY: 192 mg/dL — AB (ref 65–99)

## 2016-09-03 NOTE — Progress Notes (Signed)
Family Medicine Teaching Service Daily Progress Note Intern Pager: [phone]  [name] Medical record number: [id number] Date of birth: 1924/02/02 Age: 82 y.o. Gender: female  Primary Care Provider: Kathrine Cords, MD Consultants: None Code Status: DNR  Assessment and Plan: [name] is a 82 y.o. female presenting with altered mental status found to have bilateral pulmonary emboli. PMH is significant for T2DM controlled, HLD, Iron deficiency anemia, HTN, CHF, asthma, GERD, OA, hx of breast ca, colon ca, history of suicidal ideation in March 2017, and depression.   Pulmonary embolism.  CT Angio with bilateral lower lobe pulmonary emboli. Started on heparin, transitioned onto eliquis 10mg  BID on 10/19.  LE doppler is negative. Vital signs stable, breathing normally on on room air with O2 sats in the high 90's.  -Continue eliquis 10mg  BID for 7 days and then transition to 5mg  BID for 61mo due to unprovoked PE -PT/OT eval and treat--> recommend SNF, patient is agreeable -Tylenol 650 mg prn for any pain  Pneumonia, resolving.  Patient with recent diagnosis of PNA on a course of Doxycycline 100mg  po bid. Lungs clear. Afebrile. No leukocytosis.  -Continue Doxycycline 100 mg bid (Day 4)  Asthma. Stable.  -Continue home albuterol neb prn  HTN.  Elevated BP on admission 180/79.  Was 157/47 this AM. -Continue home Norvasc 5 mg daily -Continue home Coreg 12.5 mg bid -continue home Cozaar 50 mg daily -follow closely  Neuropathy. Stable.  -Continue home Gabapentin 300 mg bid  HLD -Continue home Crestor 20 mg daily  Unstageable right heel ulcer.  Dry cracked and calloused heels bilaterally.  -apply moisturizing lotion -support lower legs on pillow when laying to prevent heel from resting on mattress -wound care consulted; Eucerin cream to both feet, foam dressings to bilateral heels, Prevalon Boots  Previous suicidal ideation.  Actively suicidal March 2017,  reported access to guns and large knife.  Living alone so was not supervised.  Was involuntarily committed to [facility] behavioral health unit for management.  -monitor mental status  History of cancer.  Breast and colon ca.   -Tramadol 50 mg Q8 prn for pain  Elevated glucose: Glucose ranged in 100-200's. Glucose then went up to 320. Added sliding scale insulin yesterday. Discussed elevated glucose with patient , she does not wish to take any insulin at this time and does not want any diabetic medications. Additionally she does not want her glucose checked. She understands that there are risks to having high glucose. -Will respect patient's wishes and stop insulin and CBGs  FEN/GI: Regular, IV NS @ KVO, Pepcid Prophylaxis: Heparin gtt  Disposition: Skilled nursing facility when able  Subjective:  Vital signs stable overnight, no acute events. Patient has no complaints this morning. She states that she slept very well last night. Denies any pain. Discussed her elevated glucose she has not wish to have any or diabetes medication at this time. Additionally she does not want her glucose checked.  Objective: Temp:  [98.1 F (36.7 C)-98.3 F (36.8 C)] 98.3 F (36.8 C) (10/22 0501) Pulse Rate:  [67-92] 67 (10/22 0924) Resp:  [18] 18 (10/22 0501) BP: (138-175)/(40-66) 138/40 (10/22 0924) SpO2:  [99 %-100 %] 99 % (10/22 0924) Weight:  [107 lb 8 oz (48.8 kg)] 107 lb 8 oz (48.8 kg) (10/22 0501) Physical Exam: General: elderly female sitting up in chair watching TV Eyes: EOMI, PERRL ENTM: MMM Neck: normal, supple Cardiovascular: RRR, no MRG, 2+ pulses Respiratory: CTA B/L, normal effort of breathing Gastrointestinal:  soft, NT ND +bs, no masses MSK: no edema, no calf tenderness  Derm: no rashes, no lesions noted Neuro: AAOx3, no focal deficits, 5/5 strength upper and lower extremities Psych: mood appropriate  Laboratory:  Recent Labs Lab 08/30/16 1758 08/31/16 0443  WBC 8.1 5.7  HGB 10.6*  8.7*  HCT 31.8* 27.1*  PLT 220 182    Recent Labs Lab 08/30/16 1758 08/31/16 0443 09/01/16 0829  NA 136 139 137  K 3.3* 2.9* 3.6  CL 105 110 108  CO2 23 23 24   BUN 12 10 10   CREATININE 0.91 0.79 0.89  CALCIUM 10.5* 10.0 10.1  PROT 8.4*  --   --   BILITOT 0.8  --   --   ALKPHOS 50  --   --   ALT 10*  --   --   AST 19  --   --   GLUCOSE 191* 132* 140*    Imaging/Diagnostic Tests: No results found.  Carlyle Dolly, MD 09/03/2016, 10:01 AM PGY-2, [employer] Intern pager: [phone], text pages welcome

## 2016-09-03 NOTE — Plan of Care (Signed)
Problem: Safety: Goal: Ability to remain free from injury will improve Outcome: Progressing Patient ambulates in room with walker and 1 assist  Problem: Pain Managment: Goal: General experience of comfort will improve Outcome: Completed/Met Date Met: 09/03/16 No c/o pain   Problem: Nutrition: Goal: Adequate nutrition will be maintained Outcome: Progressing Patient eats small amounts at a time; will also drink Ensure and small snacks throughout the day

## 2016-09-04 ENCOUNTER — Encounter: Payer: Self-pay | Admitting: *Deleted

## 2016-09-04 ENCOUNTER — Ambulatory Visit: Payer: Medicare Other | Admitting: Family

## 2016-09-04 DIAGNOSIS — I10 Essential (primary) hypertension: Secondary | ICD-10-CM | POA: Diagnosis not present

## 2016-09-04 DIAGNOSIS — R300 Dysuria: Secondary | ICD-10-CM | POA: Diagnosis not present

## 2016-09-04 DIAGNOSIS — N3000 Acute cystitis without hematuria: Secondary | ICD-10-CM | POA: Diagnosis not present

## 2016-09-04 DIAGNOSIS — E440 Moderate protein-calorie malnutrition: Secondary | ICD-10-CM | POA: Diagnosis not present

## 2016-09-04 DIAGNOSIS — D508 Other iron deficiency anemias: Secondary | ICD-10-CM | POA: Diagnosis not present

## 2016-09-04 DIAGNOSIS — M150 Primary generalized (osteo)arthritis: Secondary | ICD-10-CM | POA: Diagnosis not present

## 2016-09-04 DIAGNOSIS — R278 Other lack of coordination: Secondary | ICD-10-CM | POA: Diagnosis not present

## 2016-09-04 DIAGNOSIS — M792 Neuralgia and neuritis, unspecified: Secondary | ICD-10-CM | POA: Diagnosis not present

## 2016-09-04 DIAGNOSIS — E119 Type 2 diabetes mellitus without complications: Secondary | ICD-10-CM | POA: Diagnosis not present

## 2016-09-04 DIAGNOSIS — R509 Fever, unspecified: Secondary | ICD-10-CM | POA: Diagnosis not present

## 2016-09-04 DIAGNOSIS — E871 Hypo-osmolality and hyponatremia: Secondary | ICD-10-CM | POA: Diagnosis not present

## 2016-09-04 DIAGNOSIS — I5022 Chronic systolic (congestive) heart failure: Secondary | ICD-10-CM | POA: Diagnosis not present

## 2016-09-04 DIAGNOSIS — W19XXXA Unspecified fall, initial encounter: Secondary | ICD-10-CM | POA: Diagnosis not present

## 2016-09-04 DIAGNOSIS — I5030 Unspecified diastolic (congestive) heart failure: Secondary | ICD-10-CM

## 2016-09-04 DIAGNOSIS — I2782 Chronic pulmonary embolism: Secondary | ICD-10-CM | POA: Diagnosis not present

## 2016-09-04 DIAGNOSIS — Z79899 Other long term (current) drug therapy: Secondary | ICD-10-CM | POA: Diagnosis not present

## 2016-09-04 DIAGNOSIS — I2699 Other pulmonary embolism without acute cor pulmonale: Secondary | ICD-10-CM | POA: Diagnosis not present

## 2016-09-04 DIAGNOSIS — G629 Polyneuropathy, unspecified: Secondary | ICD-10-CM | POA: Diagnosis not present

## 2016-09-04 DIAGNOSIS — R531 Weakness: Secondary | ICD-10-CM | POA: Diagnosis not present

## 2016-09-04 DIAGNOSIS — K5909 Other constipation: Secondary | ICD-10-CM | POA: Diagnosis not present

## 2016-09-04 DIAGNOSIS — R451 Restlessness and agitation: Secondary | ICD-10-CM | POA: Diagnosis not present

## 2016-09-04 DIAGNOSIS — K219 Gastro-esophageal reflux disease without esophagitis: Secondary | ICD-10-CM | POA: Diagnosis not present

## 2016-09-04 DIAGNOSIS — I2510 Atherosclerotic heart disease of native coronary artery without angina pectoris: Secondary | ICD-10-CM | POA: Diagnosis not present

## 2016-09-04 DIAGNOSIS — J189 Pneumonia, unspecified organism: Secondary | ICD-10-CM | POA: Diagnosis not present

## 2016-09-04 DIAGNOSIS — K449 Diaphragmatic hernia without obstruction or gangrene: Secondary | ICD-10-CM | POA: Diagnosis not present

## 2016-09-04 DIAGNOSIS — E785 Hyperlipidemia, unspecified: Secondary | ICD-10-CM | POA: Diagnosis not present

## 2016-09-04 DIAGNOSIS — R488 Other symbolic dysfunctions: Secondary | ICD-10-CM | POA: Diagnosis not present

## 2016-09-04 DIAGNOSIS — R2681 Unsteadiness on feet: Secondary | ICD-10-CM | POA: Diagnosis not present

## 2016-09-04 DIAGNOSIS — E782 Mixed hyperlipidemia: Secondary | ICD-10-CM | POA: Diagnosis not present

## 2016-09-04 DIAGNOSIS — M6281 Muscle weakness (generalized): Secondary | ICD-10-CM | POA: Diagnosis not present

## 2016-09-04 DIAGNOSIS — R41841 Cognitive communication deficit: Secondary | ICD-10-CM | POA: Diagnosis not present

## 2016-09-04 DIAGNOSIS — R110 Nausea: Secondary | ICD-10-CM | POA: Diagnosis not present

## 2016-09-04 DIAGNOSIS — D509 Iron deficiency anemia, unspecified: Secondary | ICD-10-CM | POA: Diagnosis not present

## 2016-09-04 DIAGNOSIS — Y92129 Unspecified place in nursing home as the place of occurrence of the external cause: Secondary | ICD-10-CM | POA: Diagnosis not present

## 2016-09-04 LAB — GLUCOSE, CAPILLARY
Glucose-Capillary: 124 mg/dL — ABNORMAL HIGH (ref 65–99)
Glucose-Capillary: 165 mg/dL — ABNORMAL HIGH (ref 65–99)

## 2016-09-04 LAB — CULTURE, BLOOD (ROUTINE X 2): CULTURE: NO GROWTH

## 2016-09-04 MED ORDER — APIXABAN 5 MG PO TABS: 5.0000 mg | ORAL_TABLET | Freq: Two times a day (BID) | ORAL | 0 refills | Status: DC

## 2016-09-04 MED ORDER — APIXABAN 5 MG PO TABS: 10.0000 mg | ORAL_TABLET | Freq: Two times a day (BID) | ORAL | 0 refills | Status: DC

## 2016-09-04 NOTE — Clinical Social Work Note (Signed)
Clinical Social Work Assessment  Patient Details  Name: [name] MRN: [id number] Date of Birth: 08-Aug-1924  Date of referral:  09/01/16               Reason for consult:  Facility Placement, Discharge Planning                Permission sought to share information with:  Facility Sport and exercise psychologist, Family Supports Permission granted to share information::  Yes, Verbal Permission Granted  Name::     [name]  Agency::  SNF's  Relationship::  Daughter  Contact Information:  [phone]  Housing/Transportation Living arrangements for the past 2 months:  [place] of Information:  Medical Team, Adult Children Patient Interpreter Needed:  None Criminal Activity/Legal Involvement Pertinent to Current Situation/Hospitalization:  No - Comment as needed Significant Relationships:  Adult Children Lives with:  Adult Children Do you feel safe going back to the place where you live?  Yes Need for family participation in patient care:  Yes (Comment)  Care giving concerns:  PT recommending SNF once medically stable.   Social Worker assessment / plan:  Patient not fully oriented. CSW called patient's daughter, [name] as no one was in the room. CSW introduced role and explained that discharge planning would be discussed. Patient's daughter confirmed interest in [facility]. No preference for backup facilities. CSW notified patient's daughter that, per MD, patient is stable for discharge today. CSW notified admissions coordinator at [facility]. No further concerns. CSW encouraged patient's daughter to contact CSW as needed. CSW will continue to follow patient and her family for support and facilitate discharge to SNF today.  Employment status:  Retired Forensic scientist:  Medicare PT Recommendations:  [place] / Referral to community resources:  [place]  Patient/Family's Response to care:  Patient not fully oriented.  Patient's daughter agreeable to SNF placement. Patient's family supportive and involved in patient's care. Patient's daughter appreciated social work interventions.  Patient/Family's Understanding of and Emotional Response to Diagnosis, Current Treatment, and Prognosis:  Patient not fully oriented. Patient's daughter understands the need for rehab prior to returning home. Patient's daughter appears happy with hospital care.  Emotional Assessment Appearance:  Appears stated age Attitude/Demeanor/Rapport:  Unable to Assess Affect (typically observed):  Unable to Assess Orientation:  Oriented to Self, Oriented to Place, Oriented to Situation Alcohol / Substance use:  Tobacco Use Psych involvement (Current and /or in the community):  No (Comment)  Discharge Needs  Concerns to be addressed:  Care Coordination Readmission within the last 30 days:  No Current discharge risk:  Dependent with Mobility Barriers to Discharge:  No Barriers Identified   Candie Chroman, LCSW 09/04/2016, 11:13 AM

## 2016-09-04 NOTE — NC FL2 (Signed)
[place] LEVEL OF CARE SCREENING TOOL     IDENTIFICATION  [name] Birthdate: January 25, 1924 Sex: female Admission Date (Current Location): 08/30/2016  [facility] and [place] Number:  Herbalist and Address:  [facility], [place] [address], [place], [place] [address]      Provider Number: [id number]  Attending Physician Name and Address:  Dickie La, MD  Relative Name and Phone Number:       Current Level of Care: Hospital Recommended Level of Care: [place] Prior Approval Number:    Date Approved/Denied:   PASRR Number: [id number]  Discharge Plan: SNF    Current Diagnoses: Patient Active Problem List   Diagnosis Date Noted  . Pressure injury of skin 08/31/2016  . Pulmonary embolus ([place]) 08/30/2016  . Mild dementia 06/01/2016  . Depression 01/20/2016  . Suicidal ideation 01/19/2016  . Prolapse of female pelvic organs 12/28/2015  . Leg weakness, bilateral 12/08/2015  . Weakness of right lower extremity 11/25/2015  . (HFpEF) heart failure with preserved ejection fraction ([place])   . CAD in native artery   . Gout 05/14/2015  . Dyslipidemia   . Essential hypertension   . Seasonal allergies 02/10/2015  . Stroke-like symptoms   . Stroke-like episode ([place]) 01/25/2015  . Syncope and collapse 01/25/2015  . Anemia of chronic illness 08/19/2014  . Acute superficial venous thrombosis of right lower extremity 08/19/2014  . Hypercalcemia 06/09/2014  . Breast cancer, left breast ([place]) 06/09/2014  . Breast cancer, right breast ([place]) 06/09/2014  . Edema of left lower extremity 03/30/2014  . PAD (peripheral artery disease) ([place]) 03/30/2014  . History of colon cancer 12/11/2013  . History of fall 05/15/2013  . Bilateral leg pain 03/07/2013  . Lower extremity edema 02/19/2013  . Stricture and stenosis of esophagus 10/27/2012  . Thoracic back pain 05/24/2012  . Tobacco abuse 06/02/2011  . History of  breast cancer in female, bilateral.  Mastectomies 10/24/2010. 04/26/2011  . Hiatal hernia 03/22/2011  . Colon polyp 03/22/2011  . Poor circulation 03/22/2011  . 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 NEUROPATHY 08/25/2009  . ANEMIA-IRON DEFICIENCY 04/29/2007  . HLD (hyperlipidemia) 01/29/2007  . Diabetes type 2, controlled ([place]) 01/10/2007  . HYPERTENSION, BENIGN SYSTEMIC 01/10/2007  . Coronary atherosclerosis 01/10/2007  . Chronic systolic heart failure ([place]) 01/10/2007  . Asthma 01/10/2007  . Reflux esophagitis 01/10/2007  . Osteoarthritis, multiple sites 01/10/2007    Orientation RESPIRATION BLADDER Height & Weight     Self, Situation, Place  Normal Incontinent Weight: 107 lb 3.2 oz (48.6 kg) Height:  4\' 11"  (149.9 cm)  BEHAVIORAL SYMPTOMS/MOOD NEUROLOGICAL BOWEL NUTRITION STATUS   (None)  (Mild dementia) Continent Diet (Regular)  AMBULATORY STATUS COMMUNICATION OF NEEDS Skin   Extensive Assist Verbally Other (Comment) (Pressure injury unstageable: Right heel, foam BID. Pressure injury Stage 2: Left heel, foam BID.)                       Personal Care Assistance Level of Assistance  Bathing, Feeding, Dressing Bathing Assistance: Limited assistance Feeding assistance: Independent Dressing Assistance: Limited assistance     Functional Limitations Info  Sight, Hearing, Speech Sight Info: Adequate Hearing Info: Adequate Speech Info: Adequate    SPECIAL CARE FACTORS FREQUENCY  PT (By licensed PT), OT (By licensed OT), Blood pressure, Diabetic urine testing     PT Frequency: 5 x week OT Frequency: 5 x week  Contractures Contractures Info: Not present    Additional Factors Info  Code Status, Allergies, Psychotropic Code Status Info: DNR Allergies Info: Lisinopril Psychotropic Info: Depression         Current Medications (09/04/2016):  This is the current hospital active medication list Current Facility-Administered Medications   Medication Dose Route Frequency Provider Last Rate Last Dose  . 0.9 %  sodium chloride infusion  250 mL Intravenous PRN Lovenia Kim, MD      . acetaminophen (TYLENOL) tablet 650 mg  650 mg Oral Q6H PRN Lovenia Kim, MD       Or  . acetaminophen (TYLENOL) suppository 650 mg  650 mg Rectal Q6H PRN Lovenia Kim, MD      . albuterol (PROVENTIL) (2.5 MG/3ML) 0.083% nebulizer solution 2.5 mg  2.5 mg Inhalation Q6H PRN Lovenia Kim, MD      . amLODipine (NORVASC) tablet 5 mg  5 mg Oral Daily Lovenia Kim, MD   5 mg at 09/04/16 1023  . apixaban (ELIQUIS) tablet 10 mg  10 mg Oral BID Romona Curls, RPH   10 mg at 09/04/16 1023   Followed by  . [START ON 09/07/2016] apixaban (ELIQUIS) tablet 5 mg  5 mg Oral BID Romona Curls, [facility]      . bisacodyl (DULCOLAX) EC tablet 5 mg  5 mg Oral Q72H Lovenia Kim, MD   5 mg at 08/31/16 0934  . carvedilol (COREG) tablet 12.5 mg  12.5 mg Oral BID WC Lovenia Kim, MD   12.5 mg at 09/04/16 0700  . famotidine (PEPCID) tablet 20 mg  20 mg Oral Daily Lovenia Kim, MD   20 mg at 09/04/16 1023  . gabapentin (NEURONTIN) capsule 300 mg  300 mg Oral BID Lovenia Kim, MD   300 mg at 09/03/16 2151  . hydrocerin (EUCERIN) cream   Topical BID Dickie La, MD      . losartan (COZAAR) tablet 50 mg  50 mg Oral Daily Lovenia Kim, MD   50 mg at 09/04/16 1023  . potassium chloride SA (K-DUR,KLOR-CON) CR tablet 40 mEq  40 mEq Oral BID Carlyle Dolly, MD   40 mEq at 09/04/16 1023  . rosuvastatin (CRESTOR) tablet 20 mg  20 mg Oral QHS Lovenia Kim, MD   20 mg at 09/03/16 2151  . sodium chloride flush (NS) 0.9 % injection 3 mL  3 mL Intravenous Q12H Lovenia Kim, MD   3 mL at 09/04/16 1024  . sodium chloride flush (NS) 0.9 % injection 3 mL  3 mL Intravenous PRN Lovenia Kim, MD      . traMADol Veatrice [place]bon) tablet 50 mg  50 mg Oral Q8H PRN Lovenia Kim, MD   50 mg at 08/31/16 2010     Discharge Medications: Please see discharge summary for a list of discharge medications.  Relevant  Imaging Results:  Relevant Lab Results:   Additional Information SS#: [id number]  Candie Chroman, LCSW

## 2016-09-04 NOTE — Discharge Planning (Signed)
Report given to Kerin Salen RN  at [facility]. Family is aware of transportation to pick patient at 3pm.

## 2016-09-04 NOTE — Clinical Social Work Note (Signed)
CSW facilitated patient discharge including contacting patient family and facility to confirm patient discharge plans. Clinical information faxed to facility and family agreeable with plan. CSW arranged ambulance transport via [facility] to [facility] around 3:00 pm. RN to call report prior to discharge [phone]).  CSW will sign off for now as social work intervention is no longer needed. Please consult [place] again if new needs arise.  Dayton Scrape, [place]

## 2016-09-04 NOTE — Patient Outreach (Signed)
Assigned Nat Christen LCSW for post hospital follow up at skilled facility for plan of returning home.

## 2016-09-04 NOTE — Progress Notes (Signed)
Patient is alert and very forgetful. Called Daughter and updated her with. Concerned about a cough and left chest wall pain. Medicated for a pain 7 out of 10. Currently sitting in chair eating lunch.

## 2016-09-04 NOTE — Clinical Social Work Placement (Signed)
   CLINICAL SOCIAL WORK PLACEMENT  NOTE  Date:  09/04/2016  Patient Details  Name: [name] MRN: [id number] Date of Birth: [birth year]  Clinical Social Work is seeking post-discharge placement for this patient at the [place] level of care (*CSW will initial, date and re-position this form in  chart as items are completed):  Yes   Patient/family provided with [place] Work Department's list of facilities offering this level of care within the geographic area requested by the patient (or if unable, by the patient's family).  Yes   Patient/family informed of their freedom to choose among providers that offer the needed level of care, that participate in Medicare, Medicaid or managed care program needed by the patient, have an available bed and are willing to accept the patient.  Yes   Patient/family informed of [place]'s ownership interest in [facility] and [facility], as well as of the fact that they are under no obligation to receive care at these facilities.  PASRR submitted to EDS on 09/04/16     PASRR number received on       Existing PASRR number confirmed on 09/04/16     FL2 transmitted to all facilities in geographic area requested by pt/family on 09/04/16     FL2 transmitted to all facilities within larger geographic area on       Patient informed that his/her managed care company has contracts with or will negotiate with certain facilities, including the following:        Yes   Patient/family informed of bed offers received.  Patient chooses bed at [facility]     Physician recommends and patient chooses bed at      Patient to be transferred to [facility] on 09/04/16.  Patient to be transferred to facility by [facility]     Patient family notified on 09/04/16 of transfer.  Name of family member notified:  Lilly     PHYSICIAN Please sign FL2     Additional Comment:     _______________________________________________ Candie Chroman, LCSW 09/04/2016, 1:21 PM

## 2016-09-04 NOTE — Clinical Social Work Placement (Signed)
   CLINICAL SOCIAL WORK PLACEMENT  NOTE  Date:  09/04/2016  Patient Details  Name: [name] MRN: [id number] Date of Birth: 1924-08-23  Clinical Social Work is seeking post-discharge placement for this patient at the [place] level of care (*CSW will initial, date and re-position this form in  chart as items are completed):  Yes   Patient/family provided with [place] Work Department's list of facilities offering this level of care within the geographic area requested by the patient (or if unable, by the patient's family).  Yes   Patient/family informed of their freedom to choose among providers that offer the needed level of care, that participate in Medicare, Medicaid or managed care program needed by the patient, have an available bed and are willing to accept the patient.  Yes   Patient/family informed of [place]'s ownership interest in [facility] and [facility], as well as of the fact that they are under no obligation to receive care at these facilities.  PASRR submitted to EDS on 09/04/16     PASRR number received on       Existing PASRR number confirmed on 09/04/16     FL2 transmitted to all facilities in geographic area requested by pt/family on 09/04/16     FL2 transmitted to all facilities within larger geographic area on       Patient informed that his/her managed care company has contracts with or will negotiate with certain facilities, including the following:            Patient/family informed of bed offers received.  Patient chooses bed at       Physician recommends and patient chooses bed at      Patient to be transferred to   on  .  Patient to be transferred to facility by       Patient family notified on   of transfer.  Name of family member notified:        PHYSICIAN Please sign FL2, Please sign DNR, Please prepare prescriptions     Additional Comment:     _______________________________________________ Candie Chroman, LCSW 09/04/2016, 11:16 AM

## 2016-09-07 ENCOUNTER — Non-Acute Institutional Stay (SKILLED_NURSING_FACILITY): Payer: Medicare Other | Admitting: Internal Medicine

## 2016-09-07 ENCOUNTER — Encounter: Payer: Self-pay | Admitting: Internal Medicine

## 2016-09-07 DIAGNOSIS — I2510 Atherosclerotic heart disease of native coronary artery without angina pectoris: Secondary | ICD-10-CM | POA: Diagnosis not present

## 2016-09-07 DIAGNOSIS — M792 Neuralgia and neuritis, unspecified: Secondary | ICD-10-CM

## 2016-09-07 DIAGNOSIS — I10 Essential (primary) hypertension: Secondary | ICD-10-CM | POA: Diagnosis not present

## 2016-09-07 DIAGNOSIS — J189 Pneumonia, unspecified organism: Secondary | ICD-10-CM | POA: Diagnosis not present

## 2016-09-07 DIAGNOSIS — R531 Weakness: Secondary | ICD-10-CM | POA: Diagnosis not present

## 2016-09-07 DIAGNOSIS — K449 Diaphragmatic hernia without obstruction or gangrene: Secondary | ICD-10-CM | POA: Diagnosis not present

## 2016-09-07 DIAGNOSIS — I2699 Other pulmonary embolism without acute cor pulmonale: Secondary | ICD-10-CM

## 2016-09-07 DIAGNOSIS — E785 Hyperlipidemia, unspecified: Secondary | ICD-10-CM

## 2016-09-07 DIAGNOSIS — E119 Type 2 diabetes mellitus without complications: Secondary | ICD-10-CM

## 2016-09-07 DIAGNOSIS — D509 Iron deficiency anemia, unspecified: Secondary | ICD-10-CM

## 2016-09-07 DIAGNOSIS — K5909 Other constipation: Secondary | ICD-10-CM

## 2016-09-07 NOTE — Progress Notes (Signed)
LOCATION: [name]  PCP: Kathrine Cords, MD   Code Status: Full Code  Goals of care: Advanced Directive information Advanced Directives 08/30/2016  Does patient have an advance directive? No  Type of Advance Directive -  Does patient want to make changes to advanced directive? -  Copy of advanced directive(s) in chart? -  Would patient like information on creating an advanced directive? No - patient declined information  Pre-existing out of facility DNR order (yellow form or pink MOST form) -       Extended Emergency Contact Information Primary Emergency Contact: Double,Lilly Address: [address]          [place], [place] [address] [place] Phone: [phone] Mobile Phone: [phone] Relation: Daughter Secondary Emergency Contact: Alain Honey [place] Phone: [phone] Relation: Daughter   Allergies  Allergen Reactions  . Lisinopril Cough    Chief Complaint  Patient presents with  . New Admit To SNF    New Admission Visit     HPI:  Patient is a 82 y.o. female seen today for short term rehabilitation post hospital admission from 08/30/16-09/04/16 with altered mental state and dyspnea. She was diagnosed with bilateral pulmonary embolism and pneumonia. She was started on heparin and antibiotic. She had elevated cbg reading and was placed on SSI but per pt request, this was discontinued. She has medical history of dm type 2, HLD, HTN, CHF, OA, iron def anemia among others. She is seen in her room today. She is seen in her room today.   Review of Systems:  Constitutional: Negative for fever and diaphoresis. Positive for being weak and tired.  HENT: Negative for headache, congestion, nasal discharge Eyes: Negative for eye pain and discharge.  Respiratory: Negative for shortness of breath and wheezing. Positive for dry cough.  Cardiovascular: Negative for chest pain, palpitations, leg swelling.  Gastrointestinal:  Negative for heartburn, nausea, vomiting, abdominal pain. Last bowel movement was 2 days ago. Genitourinary: Negative for dysuria  Musculoskeletal: Negative for fall in the facility.  Skin: Negative for itching, rash.  Neurological: Negative for dizziness. Psychiatric/Behavioral: Negative for depression   Past Medical History:  Diagnosis Date  . Allergy   . Anemia    Iron def anemia  . Arthritis   . Asthma   . Breast cancer ([facility]) 10/2010   Invasive Ductal Carcinoma, s/p bilateral mastectomy, now on Tamoxifen. Followed by Dr Jamse Arn.   . Breast cancer ([facility]) 2011   s/p Bilateral masectomy  . CAD (coronary artery disease)   . Carotid artery aneurysm ([facility]) 08/2010   right ICA, 5 x 81m  . CHF (congestive heart failure) ([facility])    Systolic   . Colon cancer ([facility]   . Colon cancer ([facility] 12/11/2013  . DDD (degenerative disc disease), cervical   . Diabetes mellitus   . Diverticulosis   . GERD (gastroesophageal reflux disease)   . H/O: GI bleed   . Hiatal hernia   . History of colon cancer   . Hypercalcemia 06/09/2014  . Hyperlipidemia   . Hypertension   . Myocardial infarction   . Neuropathy ([facility]   . Pneumonia 03/2012  . Shortness of breath    Past Surgical History:  Procedure Laterality Date  . Breast masectomy  10/2010   Bilateral masectomy by Dr NLucia Gaskinss/p invasive ductal carcinoma.  .Marland KitchenBREAST SURGERY    . Cardiac  Catherization  2007   Severe 3-vessel disease.  EF 20-25%.  . ESOPHAGOGASTRODUODENOSCOPY  2001  Esophageal Tear  . ESOPHAGOGASTRODUODENOSCOPY  10/27/2012   Procedure: ESOPHAGOGASTRODUODENOSCOPY (EGD);  Surgeon: Irene Shipper, MD;  Location: [facility] ENDOSCOPY;  Service: Endoscopy;  Laterality: N/A;  pat  . JOINT REPLACEMENT Right 2001   Knee  . TOTAL KNEE ARTHROPLASTY  2001   Social History:   reports that she has never smoked. Her smokeless tobacco use includes Snuff. She reports that she does not drink alcohol or use drugs.  Family History  Problem Relation Age  of Onset  . Sickle cell anemia Other   . Heart disease Mother   . Heart disease Father   . Cancer Daughter 33    breast ca  . Hypertension Daughter   . Cancer Daughter 70    breast ca  . Hypertension Daughter   . Heart disease Son     Poor circulation-Left Leg  . Diabetes Daughter   . Hypertension Daughter   . Hyperlipidemia Daughter     Poor circulation- Toe amputation    Medications:   Medication List       Accurate as of 09/07/16  2:24 PM. Always use your most recent med list.          acetaminophen 500 MG tablet Commonly known as:  TYLENOL Take 500 mg by mouth every 6 (six) hours as needed for moderate pain (pain).   albuterol 108 (90 Base) MCG/ACT inhaler Commonly known as:  PROVENTIL HFA;VENTOLIN HFA Inhale 2 puffs into the lungs every 6 (six) hours as needed. For shortness of breath   amLODipine 5 MG tablet Commonly known as:  NORVASC TAKE 1 TABLET (5 MG TOTAL) BY MOUTH DAILY.   apixaban 5 MG Tabs tablet Commonly known as:  ELIQUIS Take 1 tablet (5 mg total) by mouth 2 (two) times daily.   ASPIRIN CHILDRENS 81 MG chewable tablet Generic drug:  aspirin Chew 81 mg by mouth daily.   BIOFREEZE EX Apply 1 application topically 2 (two) times daily as needed (pain).   bisacodyl 5 MG EC tablet Generic drug:  bisacodyl Take 5 mg by mouth every 3 (three) days.   Blood Pressure Kit Use the blood pressure kit to check you blood pressure twice a day   capsaicin 0.025 % cream Commonly known as:  ZOSTRIX APPLY TO AFFECTED AREA TWICE A DAY   carvedilol 12.5 MG tablet Commonly known as:  COREG Take 1 tablet (12.5 mg total) by mouth 2 (two) times daily with a meal.   diclofenac sodium 1 % Gel Commonly known as:  VOLTAREN APPLY 2 grams topically 4 TIMES DAILY AS NEEDED FOR PAIN   gabapentin 300 MG capsule Commonly known as:  NEURONTIN Take 1 capsule (300 mg total) by mouth 2 (two) times daily.   glucose blood test strip Use as instructed Dx. Code  250.02   guaifenesin 100 MG/5ML syrup Commonly known as:  ROBITUSSIN Take 10 mLs (200 mg total) by mouth 3 (three) times daily as needed for cough.   loratadine 10 MG tablet Commonly known as:  CLARITIN TAKE 1/2 TABLET BY MOUTH DAILY   losartan 50 MG tablet Commonly known as:  COZAAR TAKE 1 TABLET (50 MG TOTAL) BY MOUTH AT BEDTIME.   PATADAY 0.2 % Soln Generic drug:  Olopatadine HCl APPLY 1 DROP TO EYE DAILY.   ranitidine 150 MG capsule Commonly known as:  ZANTAC TAKE ONE CAPSULE BY MOUTH TWICE A DAY AS NEEDED HEARTBURN   rosuvastatin 20 MG tablet Commonly known as:  CRESTOR Take 1 tablet (20 mg total) by  mouth at bedtime.   traMADol 50 MG tablet Commonly known as:  ULTRAM Take 1 tablet (50 mg total) by mouth every 8 (eight) hours as needed for severe pain.       Immunizations: Immunization History  Administered Date(s) Administered  . Influenza Split 08/18/2011, 08/06/2012  . Influenza Whole 08/04/2008, 12/08/2009  . Influenza,inj,Quad PF,36+ Mos 11/13/2013, 08/16/2014, 08/06/2015, 08/15/2016  . PPD Test 09/04/2016  . Pneumococcal Conjugate-13 08/27/2015  . Pneumococcal Polysaccharide-23 01/11/1997  . Td 04/14/1999     Physical Exam:  Vitals:   09/07/16 1418  BP: 130/69  Pulse: 68  Resp: 20  Temp: 98.3 F (36.8 C)  TempSrc: Oral  SpO2: 97%  Weight: 108 lb (49 kg)  Height: 4' 11" (1.499 m)   Body mass index is 21.81 kg/m.  General- elderly female, thin built, in no acute distress Head- normocephalic, atraumatic Nose- no nasal discharge Throat- moist mucus membrane, has dentures Eyes- PERRLA, EOMI, no pallor, no icterus Neck- no cervical lymphadenopathy Cardiovascular- normal s1,s2, no murmur Respiratory- bilateral clear to auscultation, no wheeze, no rhonchi, no crackles, no use of accessory muscles Abdomen- bowel sounds present, soft, non tender Musculoskeletal- able to move all 4 extremities, generalized weakness Neurological- alert and  oriented to person and place but not to time Skin- warm and dry Psychiatry- normal mood and affect    Labs reviewed: Basic Metabolic Panel:  Recent Labs  08/30/16 1758 08/31/16 0443 09/01/16 0829  NA 136 139 137  K 3.3* 2.9* 3.6  CL 105 110 108  CO2 _0 GLUCOSE 191* 132* 140*  BUN _1 CREATININE 0.91 0.79 0.89  CALCIUM 10.5* 10.0 10.1  MG  --  1.7  --    Liver Function Tests:  Recent Labs  11/25/15 0730 01/19/16 2053 08/30/16 1758  AST 14* 17 19  ALT 8* 10* 10*  ALKPHOS 44 52 50  BILITOT 0.2* 0.3 0.8  PROT 6.4* 8.1 8.4*  ALBUMIN 2.8* 3.8 3.5    Recent Labs  09/16/15 1412  LIPASE 47   No results for input(s): AMMONIA in the last 8760 hours. CBC:  Recent Labs  01/19/16 2053  08/24/16 1500 08/30/16 1758 08/31/16 0443  WBC 6.4  < > 5.0 8.1 5.7  NEUTROABS 3.4  --  3.0 6.3  --   HGB 11.2*  < > 10.5* 10.6* 8.7*  HCT 35.6*  < > 32.0* 31.8* 27.1*  MCV 71.9*  < > 68.4* 68.1* 68.3*  PLT 180  < > 198 220 182  < > = values in this interval not displayed. Cardiac Enzymes:  Recent Labs  09/17/15 0242 09/17/15 0756 11/25/15 1110  TROPONINI <0.03 <0.03 <0.03   BNP: Invalid input(s): POCBNP CBG:  Recent Labs  09/03/16 2115 09/04/16 0556 09/04/16 1152  GLUCAP 192* 124* 165*    Radiological Exams: Dg Chest 2 View  Result Date: 08/30/2016 CLINICAL DATA:  Altered mental status starting this morning EXAM: CHEST  2 VIEW COMPARISON:  08/24/2016 FINDINGS: Cardiomegaly is noted. Elevation of the right hemidiaphragm. No acute infiltrate or pleural effusion. No pulmonary edema. Degenerative changes thoracic spine. IMPRESSION: No active cardiopulmonary disease. Electronically Signed   By: Lahoma Crocker M.D.   On: 08/30/2016 17:09   Dg Chest 2 View  Result Date: 08/24/2016 CLINICAL DATA:  Hervey Ard mid chest pain for the past day with intense belching. History of CHF, breast and colon malignancy the lungs are well-expanded. There is no focal infiltrate.  EXAM: CHEST  2  VIEW COMPARISON:  PA and lateral chest x-ray of March 02, 2016 FINDINGS: The lungs are well-expanded. There are increased lung markings at the right base with an area of infiltrate in the right lower lobe posteriorly. There is a small amount of pleural fluid blunting the costophrenic angles on the right. The left lung is clear. The cardiac silhouette is top-normal in size. The central pulmonary vascularity is mildly prominent but not greatly changed from the previous study. There is calcification in the wall of the aortic arch. There are degenerative changes at multiple thoracic disc levels. There is loss of height of the body of approximately L2 which is stable. IMPRESSION: Right lower lobe pneumonia with some associated atelectasis and small pleural effusion. Followup PA and lateral chest X-ray is recommended in 3-4 weeks following trial of antibiotic therapy to ensure resolution and exclude underlying malignancy. Top-normal cardiac size with chronic central pulmonary vascular congestion. Aortic atherosclerosis. Electronically Signed   By: David  Martinique M.D.   On: 08/24/2016 15:23   Ct Angio Chest Pe W And/or Wo Contrast  Result Date: 08/30/2016 CLINICAL DATA:  Low oxygen saturation. Recently diagnosed with pneumonia. EXAM: CT ANGIOGRAPHY CHEST WITH CONTRAST TECHNIQUE: Multidetector CT imaging of the chest was performed using the standard protocol during bolus administration of intravenous contrast. Multiplanar CT image reconstructions and MIPs were obtained to evaluate the vascular anatomy. CONTRAST:  75 mL Isovue 370 IV COMPARISON:  01/21/2013 chest CT and same day chest radiograph FINDINGS: Cardiovascular: Acute nonocclusive pulmonary emboli are noted on the right, chest distal to the right main pulmonary artery extending into the right lower lobe lobar, segmental and subsegmental pulmonary arteries. Small subsegmental Edit occlusive left lower lobe pulmonary embolus is also identified. The  RV to LV ratio is approximately 0.84. The heart is top normal. Coronary arteriosclerosis. No pericardial effusion. Aortic atherosclerosis. No aneurysm. Mediastinum/Nodes: 1 cm short axis right lower paratracheal lymph node. Similar sized aorticopulmonary short axis lymph nodes. Small right hilar lymph nodes. Small hiatal hernia with mild-to-moderate diffuse thickening of the esophagus possibly inflammatory. Lungs/Pleura: No pneumonic consolidations. Lingular, right middle lobe and left upper lobe atelectasis. Upper Abdomen: Mild thickening of the left adrenal apex. No acute abnormality. Musculoskeletal: Degenerative disc disease along the visualized dorsal spine. No acute osseous abnormality. Review of the MIP images confirms the above findings. IMPRESSION: Bilateral lower lobe pulmonary emboli, nonocclusive involving right lobar through subsegmental pulmonary arteries to the right lower lobe and occlusive subsegmental left lower lobe pulmonary arteries. The RV to LV ratio slightly under the cutoff for submassive PE. Critical Value/emergent results were called by telephone at the time of interpretation on 08/30/2016 at 7:53 pm to Dr. Shirlyn Goltz , who verbally acknowledged these results. Electronically Signed   By: Ashley Royalty M.D.   On: 08/30/2016 19:54   Dg Foot Complete Right  Result Date: 08/24/2016 CLINICAL DATA:  General right foot pain for 2 days. EXAM: RIGHT FOOT COMPLETE - 3+ VIEW COMPARISON:  None. FINDINGS: No fracture. No subluxation or dislocation. No worrisome lytic or sclerotic osseous abnormality. Degenerative changes are noted at the MTP joint of the great toe. IMPRESSION: No acute bony findings. Electronically Signed   By: [name] M.D.   On: 08/24/2016 15:22    Assessment/Plan  Generalized weakness Will have her work with physical therapy and occupational therapy team to help with gait training and muscle strengthening exercises.fall precautions. Skin care. Encourage to be out of  bed.   Acute pulmonary embolism Breathing currently stable. Complete  eliquis 10 mg bid dosing, start eliquis 5 mg bid from today and monitor  Pneumonia Completed her doxycycline course. Continue robitussin as needed for cough  Iron deficiency anemia With her on anticoagulation that can increase risk for bleed. Monitor h&h  Hiatal hernia Continue ranitidne 150 mg bid prn and monitor  Constipation On bisacodyl q3 days   CAD Chest pain free. Continue coreg and cozaar for now with baby aspirin  HTN Continue cozaar, coreg with norvasc and monitor BP  HLD  continue crestor 20 mg daily  neuropathic pain Continue gabapentin 300 mg bid  Dm type 2 diet controlled Lab Results  Component Value Date   HGBA1C 8.8 08/15/2016   Given her age, a1c goal <8. Patient does not want to take any insulin or oral hypoglycemic. Monitor cbg twice daily for now and if cbg > 250 persistently, consider reviewing treatment option with patient     Goals of care: short term rehabilitation   Labs/tests ordered: cbc, cmp 09/08/16  Family/ staff Communication: reviewed care plan with patient and nursing supervisor    Blanchie Serve, MD Internal Medicine [place], [place] [address] Cell Phone (Monday-Friday 8 am - 5 pm): [phone] On Call: [phone] and follow prompts after 5 pm and on weekends Office Phone: [phone] Office Fax: [phone]

## 2016-09-08 ENCOUNTER — Ambulatory Visit: Payer: Self-pay | Admitting: Family Medicine

## 2016-09-08 LAB — HEPATIC FUNCTION PANEL
ALK PHOS: 50 U/L (ref 25–125)
ALT: 8 U/L (ref 7–35)
AST: 15 U/L (ref 13–35)
Bilirubin, Total: 0.7 mg/dL

## 2016-09-08 LAB — CBC AND DIFFERENTIAL
HEMATOCRIT: 30 % — AB (ref 36–46)
Hemoglobin: 9.3 g/dL — AB (ref 12.0–16.0)
PLATELETS: 194 10*3/uL (ref 150–399)
WBC: 8.7 10^3/mL

## 2016-09-08 LAB — BASIC METABOLIC PANEL
BUN: 24 mg/dL — AB (ref 4–21)
CREATININE: 1.3 mg/dL — AB (ref 0.5–1.1)
Glucose: 201 mg/dL
Potassium: 4.7 mmol/L (ref 3.4–5.3)
SODIUM: 132 mmol/L — AB (ref 137–147)

## 2016-09-11 ENCOUNTER — Other Ambulatory Visit: Payer: Self-pay | Admitting: *Deleted

## 2016-09-12 ENCOUNTER — Encounter: Payer: Self-pay | Admitting: *Deleted

## 2016-09-12 ENCOUNTER — Non-Acute Institutional Stay (SKILLED_NURSING_FACILITY): Payer: Medicare Other | Admitting: Family

## 2016-09-12 DIAGNOSIS — E871 Hypo-osmolality and hyponatremia: Secondary | ICD-10-CM

## 2016-09-12 DIAGNOSIS — E440 Moderate protein-calorie malnutrition: Secondary | ICD-10-CM | POA: Diagnosis not present

## 2016-09-12 DIAGNOSIS — M159 Polyosteoarthritis, unspecified: Secondary | ICD-10-CM

## 2016-09-12 DIAGNOSIS — M150 Primary generalized (osteo)arthritis: Secondary | ICD-10-CM

## 2016-09-12 DIAGNOSIS — D509 Iron deficiency anemia, unspecified: Secondary | ICD-10-CM | POA: Diagnosis not present

## 2016-09-12 MED ORDER — FERROUS SULFATE 325 (65 FE) MG PO TABS: 325.0000 mg | ORAL_TABLET | Freq: Every day | ORAL | Status: DC

## 2016-09-12 MED ORDER — DOCUSATE SODIUM 100 MG PO CAPS: 100.0000 mg | ORAL_CAPSULE | Freq: Every day | ORAL | 0 refills | Status: DC

## 2016-09-12 NOTE — Progress Notes (Signed)
Location:  [name] Room Number: 407 Place of Service:  SNF (31) Provider:  Moxie Kalil FNP-C   Rodrigo Ran, MD  Patient Care Team: Joanna Puff, MD as PCP - General Artis Delay, MD as Consulting Physician (Hematology and Oncology) Karolee Stamps, LCSW as [facility] Care Management (Licensed Clinical Social Worker)  Extended Emergency Contact Information Primary Emergency Contact: [place] Address: [address]          [place], [place] [address] [place] Home Phone: [phone] Mobile Phone: [phone] Relation: Daughter Secondary Emergency Contact: Quintella [place] Home Phone: [phone] Relation: Daughter  Code Status: Full Code  Goals of care: Advanced Directive information Advanced Directives 09/12/2016  Does patient have an advance directive? No  Type of Advance Directive -  Does patient want to make changes to advanced directive? -  Copy of advanced directive(s) in chart? -  Would patient like information on creating an advanced directive? No - patient declined information  Pre-existing out of facility DNR order (yellow form or pink MOST form) -     Chief Complaint  Patient presents with  . Acute Visit    abnormal labs     HPI:  Pt is a 82 y.o. female seen today at [facility] for an acute visit for evaluation of abnormal lab results. She is seen in her room today with daughter at bedside. She complains of generalized pain due to arthritis.Patient's daughter states she usually takes tylenol three times daily while at home. Her recent lab results Hgb 9.3, HCT 29.7 Na 132, Glucose 201, Alb 2.99 (09/08/2016). She denies any fever, chills, dizziness, headache, chest pains, shortness of breath or cough.    Past Medical History:  Diagnosis Date  . Allergy   . Anemia    Iron def anemia  . Arthritis   . Asthma   . Breast cancer (HCC) 10/2010   Invasive Ductal Carcinoma, s/p bilateral mastectomy, now on Tamoxifen. Followed by Dr Dalene Carrow.   . Breast cancer (HCC) 2011   s/p Bilateral masectomy  . CAD (coronary artery disease)   . Carotid artery aneurysm (HCC) 08/2010   right ICA, 5 x 102mm  . CHF (congestive heart failure) (HCC)    Systolic   . Colon cancer (HCC)   . Colon cancer (HCC) 12/11/2013  . DDD (degenerative disc disease), cervical   . Diabetes mellitus   . Diverticulosis   . GERD (gastroesophageal reflux disease)   . H/O: GI bleed   . Hiatal hernia   . History of colon cancer   . Hypercalcemia 06/09/2014  . Hyperlipidemia   . Hypertension   . Myocardial infarction   . Neuropathy (HCC)   . Pneumonia 03/2012  . Shortness of breath    Past Surgical History:  Procedure Laterality Date  . Breast masectomy  10/2010   Bilateral masectomy by Dr Ezzard Standing s/p invasive ductal carcinoma.  Marland Kitchen BREAST SURGERY    . Cardiac  Catherization  2007   Severe 3-vessel disease.  EF 20-25%.  . ESOPHAGOGASTRODUODENOSCOPY  2001   Esophageal Tear  . ESOPHAGOGASTRODUODENOSCOPY  10/27/2012   Procedure: ESOPHAGOGASTRODUODENOSCOPY (EGD);  Surgeon: Hilarie Fredrickson, MD;  Location: [facility] ENDOSCOPY;  Service: Endoscopy;  Laterality: N/A;  pat  . JOINT REPLACEMENT Right 2001   Knee  . TOTAL KNEE ARTHROPLASTY  2001    Allergies  Allergen Reactions  . Lisinopril Cough      Medication List  Accurate as of 09/12/16  4:21 PM. Always use your most recent med list.          acetaminophen 500 MG tablet Commonly known as:  TYLENOL Take 500 mg by mouth every 6 (six) hours as needed for moderate pain (pain).   albuterol 108 (90 Base) MCG/ACT inhaler Commonly known as:  PROVENTIL HFA;VENTOLIN HFA Inhale 2 puffs into the lungs every 6 (six) hours as needed. For shortness of breath   amLODipine 5 MG tablet Commonly known as:  NORVASC TAKE 1 TABLET (5 MG TOTAL) BY MOUTH DAILY.   apixaban 5 MG Tabs tablet Commonly known as:  ELIQUIS Take 1  tablet (5 mg total) by mouth 2 (two) times daily.   ASPIRIN CHILDRENS 81 MG chewable tablet Generic drug:  aspirin Chew 81 mg by mouth daily.   BIOFREEZE EX Apply 1 application topically 2 (two) times daily as needed (pain).   bisacodyl 5 MG EC tablet Generic drug:  bisacodyl Take 5 mg by mouth every 3 (three) days.   Blood Pressure Kit Use the blood pressure kit to check you blood pressure twice a day   capsaicin 0.025 % cream Commonly known as:  ZOSTRIX APPLY TO AFFECTED AREA TWICE A DAY   carvedilol 12.5 MG tablet Commonly known as:  COREG Take 1 tablet (12.5 mg total) by mouth 2 (two) times daily with a meal.   DECUBI-VITE Caps Take 1 capsule by mouth.   diclofenac sodium 1 % Gel Commonly known as:  VOLTAREN APPLY 2 grams topically 4 TIMES DAILY AS NEEDED FOR PAIN   docusate sodium 100 MG capsule Commonly known as:  COLACE Take 1 capsule (100 mg total) by mouth daily.   feeding supplement (PRO-STAT SUGAR FREE 64) Liqd Take 30 mLs by mouth 2 (two) times daily with a meal.   gabapentin 300 MG capsule Commonly known as:  NEURONTIN Take 1 capsule (300 mg total) by mouth 2 (two) times daily.   glucose blood test strip Use as instructed Dx. Code 250.02   guaifenesin 100 MG/5ML syrup Commonly known as:  ROBITUSSIN Take 10 mLs (200 mg total) by mouth 3 (three) times daily as needed for cough.   loratadine 10 MG tablet Commonly known as:  CLARITIN TAKE 1/2 TABLET BY MOUTH DAILY   losartan 50 MG tablet Commonly known as:  COZAAR TAKE 1 TABLET (50 MG TOTAL) BY MOUTH AT BEDTIME.   PATADAY 0.2 % Soln Generic drug:  Olopatadine HCl APPLY 1 DROP TO EYE DAILY.   ranitidine 150 MG capsule Commonly known as:  ZANTAC TAKE ONE CAPSULE BY MOUTH TWICE A DAY AS NEEDED HEARTBURN   rosuvastatin 20 MG tablet Commonly known as:  CRESTOR Take 1 tablet (20 mg total) by mouth at bedtime.   traMADol 50 MG tablet Commonly known as:  ULTRAM Take 1 tablet (50 mg total) by  mouth every 8 (eight) hours as needed for severe pain.       Review of Systems  Constitutional: Negative for activity change, appetite change, chills, fatigue and fever.  HENT: Negative for congestion, rhinorrhea, sinus pressure, sneezing and sore throat.   Eyes: Negative.   Respiratory: Negative for cough, chest tightness, shortness of breath and wheezing.   Cardiovascular: Negative for chest pain, palpitations and leg swelling.  Gastrointestinal: Negative for abdominal distention, abdominal pain, constipation, diarrhea, nausea and vomiting.  Genitourinary: Negative for dysuria, frequency and urgency.  Musculoskeletal: Positive for gait problem.  Skin: Negative for color change, pallor and rash.  Neurological:  Negative for dizziness, seizures, syncope, light-headedness and headaches.  Hematological: Does not bruise/bleed easily.  Psychiatric/Behavioral: Negative for agitation, confusion, hallucinations and sleep disturbance. The patient is not nervous/anxious.     Immunization History  Administered Date(s) Administered  . Influenza Split 08/18/2011, 08/06/2012  . Influenza Whole 08/04/2008, 12/08/2009  . Influenza,inj,Quad PF,36+ Mos 11/13/2013, 08/16/2014, 08/06/2015, 08/15/2016  . PPD Test 09/04/2016  . Pneumococcal Conjugate-13 08/27/2015  . Pneumococcal Polysaccharide-23 01/11/1997  . Td 04/14/1999   Pertinent  Health Maintenance Due  Topic Date Due  . OPHTHALMOLOGY EXAM  11/18/2015  . HEMOGLOBIN A1C  02/13/2017  . FOOT EXAM  08/15/2017  . INFLUENZA VACCINE  Completed  . DEXA SCAN  Completed  . PNA vac Low Risk Adult  Completed   Fall Risk  09/12/2016 02/02/2016 12/08/2015 09/14/2015 08/03/2015  Falls in the past year? Yes No No No Yes  Number falls in past yr: 2 or more - - - 1  Injury with Fall? Yes - - - No  Risk Factor Category  High Fall Risk - - - -  Risk for fall due to : History of fall(s);Impaired balance/gait;Impaired mobility;Impaired vision;Mental status  change - - - -  Follow up Education provided;Falls prevention discussed - - - -      Vitals:   09/12/16 1000  BP: (!) 119/57  Pulse: 81  Resp: 18  Temp: 97 F (36.1 C)  SpO2: 96%  Weight: 108 lb (49 kg)  Height: '4\' 11"'$  (1.499 m)   Body mass index is 21.81 kg/m. Physical Exam  Constitutional: She is oriented to person, place, and time. She appears well-developed and well-nourished. No distress.  HENT:  Head: Normocephalic.  Mouth/Throat: Oropharynx is clear and moist.  Eyes: Conjunctivae and EOM are normal. Pupils are equal, round, and reactive to light. Right eye exhibits no discharge. Left eye exhibits no discharge. No scleral icterus.  Neck: Normal range of motion. No JVD present. No thyromegaly present.  Cardiovascular: Normal rate, regular rhythm, normal heart sounds and intact distal pulses.  Exam reveals no gallop and no friction rub.   No murmur heard. Pulmonary/Chest: Effort normal and breath sounds normal. No respiratory distress. She has no wheezes. She has no rales.  Abdominal: Soft. Bowel sounds are normal. She exhibits no distension. There is no tenderness. There is no rebound and no guarding.  Musculoskeletal: She exhibits no edema, tenderness or deformity.  Moves X 4 extremities. Unsteady gait.   Lymphadenopathy:    She has no cervical adenopathy.  Neurological: She is oriented to person, place, and time.  Skin: Skin is warm and dry. No rash noted. No erythema. No pallor.  Psychiatric: She has a normal mood and affect.    Labs reviewed:  Recent Labs  08/30/16 1758 08/31/16 0443 09/01/16 0829 09/08/16  NA 136 139 137 132*  K 3.3* 2.9* 3.6 4.7  CL 105 110 108  --   CO2 '23 23 24  '$ --   GLUCOSE 191* 132* 140*  --   BUN '12 10 10 '$ 24*  CREATININE 0.91 0.79 0.89 1.3*  CALCIUM 10.5* 10.0 10.1  --   MG  --  1.7  --   --     Recent Labs  11/25/15 0730 01/19/16 2053 08/30/16 1758 09/08/16  AST 14* '17 19 15  '$ ALT 8* 10* 10* 8  ALKPHOS 44 52 50 50    BILITOT 0.2* 0.3 0.8  --   PROT 6.4* 8.1 8.4*  --   ALBUMIN 2.8* 3.8 3.5  --  Recent Labs  01/19/16 2053  08/24/16 1500 08/30/16 1758 08/31/16 0443 09/08/16  WBC 6.4  < > 5.0 8.1 5.7 8.7  NEUTROABS 3.4  --  3.0 6.3  --   --   HGB 11.2*  < > 10.5* 10.6* 8.7* 9.3*  HCT 35.6*  < > 32.0* 31.8* 27.1* 30*  MCV 71.9*  < > 68.4* 68.1* 68.3*  --   PLT 180  < > 198 220 182 194  < > = values in this interval not displayed. Lab Results  Component Value Date   TSH 3.560 04/07/2015   Lab Results  Component Value Date   HGBA1C 8.8 08/15/2016   Lab Results  Component Value Date   CHOL 96 07/10/2012   HDL 31 (L) 07/10/2012   LDLCALC 47 07/10/2012   LDLDIRECT 99 10/21/2009   TRIG 92 07/10/2012   CHOLHDL 3.1 07/10/2012    Assessment/Plan 1. Iron deficiency anemia, unspecified iron deficiency anemia type Hgb 9.3, HCT 29.7  (09/08/2016). Start Ferrous sulfate 325 mg Tablet one by mouth daily along with colace 100 mg Tablet daily. CBC 09/20/2016  2. Hyponatremia Na 132 (09/08/2016). Asymptomatic.her appetite has improved. Will monitor for now. BMP 09/20/2016.   3. Primary osteoarthritis involving multiple joints Worse on shoulders and fingers. Takes tylenol three times daily at home. Tylenol 500 mg Tablet twice daily. Continue Tramadol PRN   4. Moderate protein-calorie malnutrition (HCC)  Alb 2.99 (09/08/2016) Seen by RD now on Prostat 30 mls twice daily and Decuvite. BMP 09/20/2016.     Family/ staff Communication: Reviewed plan of care with patient, patient's daughter and facility Nurse supervisor   Labs/tests ordered: CBC,BMP 09/20/2016.

## 2016-09-12 NOTE — Patient Outreach (Signed)
[facility]) Care Management  09/12/2016  [name] 1924/05/23 [id number]   CSW was able to make initial contact with patient today to perform the assessment, as well as assess and assist with social work needs and services, when [place] met with patient at [facility] [place] where patient currently resides to receive short-term rehabilitative services.  CSW introduced self, explained role and types of services provided through [facility] ([facility]).  CSW further explained to patient that CSW works with Natividad Brood, Hospital Liaison with [facility]. CSW then explained the reason for the call, indicating that Mrs. Brewer thought that patient would benefit from social work services and resources to assist with possible discharge planning needs and services from the skilled nursing facility.  CSW obtained two HIPAA compliant identifiers from patient, which included patient's name and date of birth. Patient admits that she lives at home with her daughter, Mada Sadik but that her daughter offers minimal assistance in the way of daily life activities.  Patient further reported that she lives on the second floor of the home so it is somewhat difficult for her to climb up and down the stairs to prepare her own meals.  Patient is interested in regaining strength in her legs so that she is able to climb up and down the stairs with ease.  Due to sores on the bottoms of patient's feet, patient is currently unable to ambulate and uses the assistance of a wheelchair for mobility purposes.  Patient is agreeable to home health services and durable medical equipment to be arranged, upon discharge from [facility].  Patient also mentioned that she will be resuming home health services with her aid, which comes several days per weeks, for a few hours each morning to assist with bathing, feeding and dressing. CSW will plan to meet  with patient again in two weeks at [facility] to assist with possible discharge planning needs and services.  In the meantime, CSW has provided patient and Ms. Cobern with CSW's contact information, encouraging them to contact CSW directly if social work services and assistance is needed in the meantime.  Patient and Ms. Hackenberg voiced understanding and were agreeable to this plan. Nat Christen, BSW, MSW, LCSW  Licensed Education officer, [facility]  [place] [address] [place], [place] [address] Physical Address-[address], [place], [place] [address] Toll Free Main # [phone] Fax # [phone] Cell # [phone]  Fax # [phone]  [name]

## 2016-09-16 ENCOUNTER — Other Ambulatory Visit: Payer: Self-pay | Admitting: Family Medicine

## 2016-09-18 ENCOUNTER — Non-Acute Institutional Stay (SKILLED_NURSING_FACILITY): Payer: Medicare Other | Admitting: Family

## 2016-09-18 ENCOUNTER — Other Ambulatory Visit: Payer: Self-pay | Admitting: *Deleted

## 2016-09-18 DIAGNOSIS — W19XXXA Unspecified fall, initial encounter: Secondary | ICD-10-CM | POA: Diagnosis not present

## 2016-09-18 DIAGNOSIS — R509 Fever, unspecified: Secondary | ICD-10-CM | POA: Diagnosis not present

## 2016-09-18 DIAGNOSIS — Y92129 Unspecified place in nursing home as the place of occurrence of the external cause: Secondary | ICD-10-CM

## 2016-09-18 DIAGNOSIS — R451 Restlessness and agitation: Secondary | ICD-10-CM | POA: Diagnosis not present

## 2016-09-18 MED ORDER — GABAPENTIN 300 MG PO CAPS: 300.0000 mg | ORAL_CAPSULE | Freq: Two times a day (BID) | ORAL | 6 refills | Status: DC

## 2016-09-18 NOTE — Progress Notes (Signed)
Location:  [facility] Room Number: [place]:  SNF (31) Provider: Rayansh Herbst FNP-C   Kathrine Cords, MD  Patient Care Team: [name], MD as PCP - General Heath Lark, MD as Consulting Physician (Hematology and Oncology) Francis Gaines, LCSW as [place] [employer] (Licensed Clinical Social Worker)  Extended Emergency Contact Information Primary Emergency Contact: [place] Address: [address]          [place], [place] [address] [place] Phone: [phone] Mobile Phone: [phone] Relation: Daughter Secondary Emergency Contact: [place] Phone: [phone] Relation: Daughter  Code Status:  Full Code  Goals of care: Advanced Directive information Advanced Directives 09/12/2016  Does patient have an advance directive? No  Type of Advance Directive -  Does patient want to make changes to advanced directive? -  Copy of advanced directive(s) in chart? -  Would patient like information on creating an advanced directive? No - patient declined information  Pre-existing out of facility DNR order (yellow form or pink MOST form) -     Chief Complaint  Patient presents with  . Acute Visit    follow up fall     HPI:  Pt is a 82 y.o. female seen today at [facility] for an acute visit for evaluation of fall episode. She has a medical history of HTN, CHF, PAD, OA, dementia among other conditions. She is seen in her room today with daughter at the bedside.She states fell this morning hitting the back side of her head. Patient's daughter states patient had a node on the back of the head.Facility Nurse reports patient's temp 99.9 neuro checks since falling in the morning are normal. She denies any fever but states feeling cold.Also denies cough, headache, dizziness,change in vision, Nausea or vomiting.    Past Medical History:  Diagnosis  Date  . Allergy   . Anemia    Iron def anemia  . Arthritis   . Asthma   . Breast cancer ([place]) 10/2010   Invasive Ductal Carcinoma, s/p bilateral mastectomy, now on Tamoxifen. Followed by Dr Jamse Arn.   . Breast cancer ([facility]) 2011   s/p Bilateral masectomy  . CAD (coronary artery disease)   . Carotid artery aneurysm ([facility]) 08/2010   right ICA, 5 x 37m  . CHF (congestive heart failure) ([facility])    Systolic   . Colon cancer ([facility]   . Colon cancer ([facility] 12/11/2013  . DDD (degenerative disc disease), cervical   . Diabetes mellitus   . Diverticulosis   . GERD (gastroesophageal reflux disease)   . H/O: GI bleed   . Hiatal hernia   . History of colon cancer   . Hypercalcemia 06/09/2014  . Hyperlipidemia   . Hypertension   . Myocardial infarction   . Neuropathy ([facility]   . Pneumonia 03/2012  . Shortness of breath    Past Surgical History:  Procedure Laterality Date  . Breast masectomy  10/2010   Bilateral masectomy by Dr NLucia Gaskinss/p invasive ductal carcinoma.  .Marland KitchenBREAST SURGERY    . Cardiac  Catherization  2007   Severe 3-vessel disease.  EF 20-25%.  . ESOPHAGOGASTRODUODENOSCOPY  2001   Esophageal Tear  . ESOPHAGOGASTRODUODENOSCOPY  10/27/2012   Procedure: ESOPHAGOGASTRODUODENOSCOPY (EGD);  Surgeon: JIrene Shipper MD;  [facility]  Service: Endoscopy;  Laterality: N/A;  pat  . JOINT REPLACEMENT Right 2001   Knee  . TOTAL  KNEE ARTHROPLASTY  2001    Allergies  Allergen Reactions  . Lisinopril Cough      Medication List       Accurate as of 09/18/16  9:59 PM. Always use your most recent med list.          acetaminophen 500 MG tablet Commonly known as:  TYLENOL Take 500 mg by mouth every 6 (six) hours as needed for moderate pain (pain).   albuterol 108 (90 Base) MCG/ACT inhaler Commonly known as:  PROVENTIL HFA;VENTOLIN HFA Inhale 2 puffs into the lungs every 6 (six) hours as needed. For shortness of breath   amLODipine 5 MG tablet Commonly known as:   NORVASC TAKE 1 TABLET (5 MG TOTAL) BY MOUTH DAILY.   apixaban 5 MG Tabs tablet Commonly known as:  ELIQUIS Take 1 tablet (5 mg total) by mouth 2 (two) times daily.   ASPIRIN CHILDRENS 81 MG chewable tablet Generic drug:  aspirin Chew 81 mg by mouth daily.   BIOFREEZE EX Apply 1 application topically 2 (two) times daily as needed (pain).   bisacodyl 5 MG EC tablet Generic drug:  bisacodyl Take 5 mg by mouth every 3 (three) days.   Blood Pressure Kit Use the blood pressure kit to check you blood pressure twice a day   capsaicin 0.025 % cream Commonly known as:  ZOSTRIX APPLY TO AFFECTED AREA TWICE A DAY   carvedilol 12.5 MG tablet Commonly known as:  COREG TAKE 1 TABLET (12.5 MG TOTAL) BY MOUTH 2 (TWO) TIMES DAILY WITH A MEAL.   DECUBI-VITE Caps Take 1 capsule by mouth.   diclofenac sodium 1 % Gel Commonly known as:  VOLTAREN APPLY 2 grams topically 4 TIMES DAILY AS NEEDED FOR PAIN   docusate sodium 100 MG capsule Commonly known as:  COLACE Take 1 capsule (100 mg total) by mouth daily.   feeding supplement (PRO-STAT SUGAR FREE 64) Liqd Take 30 mLs by mouth 2 (two) times daily with a meal.   gabapentin 300 MG capsule Commonly known as:  NEURONTIN Take 1 capsule (300 mg total) by mouth 2 (two) times daily.   glucose blood test strip Use as instructed Dx. Code 250.02   guaifenesin 100 MG/5ML syrup Commonly known as:  ROBITUSSIN Take 10 mLs (200 mg total) by mouth 3 (three) times daily as needed for cough.   loratadine 10 MG tablet Commonly known as:  CLARITIN TAKE 1/2 TABLET BY MOUTH DAILY   losartan 50 MG tablet Commonly known as:  COZAAR TAKE 1 TABLET (50 MG TOTAL) BY MOUTH AT BEDTIME.   PATADAY 0.2 % Soln Generic drug:  Olopatadine HCl APPLY 1 DROP TO EYE DAILY.   ranitidine 150 MG capsule Commonly known as:  ZANTAC TAKE ONE CAPSULE BY MOUTH TWICE A DAY AS NEEDED HEARTBURN   rosuvastatin 20 MG tablet Commonly known as:  CRESTOR Take 1 tablet (20  mg total) by mouth at bedtime.   traMADol 50 MG tablet Commonly known as:  ULTRAM Take 1 tablet (50 mg total) by mouth every 8 (eight) hours as needed for severe pain.       Review of Systems  Constitutional: Negative for activity change, appetite change, chills, fatigue and fever.  HENT: Negative for congestion, rhinorrhea, sinus pressure, sneezing and sore throat.   Eyes: Negative.   Respiratory: Negative for cough, chest tightness, shortness of breath and wheezing.   Cardiovascular: Negative for chest pain, palpitations and leg swelling.  Gastrointestinal: Negative for abdominal distention, abdominal pain, constipation, diarrhea,  nausea and vomiting.  Endocrine: Negative.   Genitourinary: Negative for dysuria, frequency and urgency.  Musculoskeletal: Positive for gait problem.  Skin: Negative for color change, pallor and rash.  Neurological: Negative for dizziness, seizures, syncope, light-headedness and headaches.  Hematological: Does not bruise/bleed easily.  Psychiatric/Behavioral: Negative for agitation, confusion, hallucinations and sleep disturbance. The patient is not nervous/anxious.     Immunization History  Administered Date(s) Administered  . Influenza Split 08/18/2011, 08/06/2012  . Influenza Whole 08/04/2008, 12/08/2009  . Influenza,inj,Quad PF,36+ Mos 11/13/2013, 08/16/2014, 08/06/2015, 08/15/2016  . PPD Test 09/04/2016  . Pneumococcal Conjugate-13 08/27/2015  . Pneumococcal Polysaccharide-23 01/11/1997  . Td 04/14/1999   Pertinent  Health Maintenance Due  Topic Date Due  . OPHTHALMOLOGY EXAM  11/18/2015  . HEMOGLOBIN A1C  02/13/2017  . FOOT EXAM  08/15/2017  . INFLUENZA VACCINE  Completed  . DEXA SCAN  Completed  . PNA vac Low Risk Adult  Completed   Fall Risk  09/12/2016 02/02/2016 12/08/2015 09/14/2015 08/03/2015  Falls in the past year? Yes No No No Yes  Number falls in past yr: 2 or more - - - 1  Injury with Fall? Yes - - - No  Risk Factor Category   High Fall Risk - - - -  Risk for fall due to : History of fall(s);Impaired balance/gait;Impaired mobility;Impaired vision;Mental status change - - - -  Follow up Education provided;Falls prevention discussed - - - -      Vitals:   09/18/16 1530  BP: 130/74  Pulse: 88  Resp: 18  Temp: 99.9 F (37.7 C)  SpO2: 98%  Weight: 109 lb 3.2 oz (49.5 kg)  Height: _0  (1.499 m)   Body mass index is 22.06 kg/m. Physical Exam  Constitutional: She is oriented to person, place, and time. She appears well-developed and well-nourished. No distress.  HENT:  Head: Normocephalic.  Mouth/Throat: Oropharynx is clear and moist.  Eyes: Conjunctivae and EOM are normal. Pupils are equal, round, and reactive to light. Right eye exhibits no discharge. Left eye exhibits no discharge. No scleral icterus.  Neck: Normal range of motion. No JVD present. No thyromegaly present.  Cardiovascular: Normal rate, regular rhythm, normal heart sounds and intact distal pulses.  Exam reveals no gallop and no friction rub.   No murmur heard. Pulmonary/Chest: Effort normal and breath sounds normal. No respiratory distress. She has no wheezes. She has no rales.  Abdominal: Soft. Bowel sounds are normal. She exhibits no distension. There is no tenderness. There is no rebound and no guarding.  Musculoskeletal: She exhibits no edema, tenderness or deformity.  Moves X 4 extremities. Unsteady gait.   Lymphadenopathy:    She has no cervical adenopathy.  Neurological: She is oriented to person, place, and time.  Skin: Skin is warm and dry. No rash noted. No erythema. No pallor.  Psychiatric:  Easily irritable    Labs reviewed:  Recent Labs  08/30/16 1758 08/31/16 0443 09/01/16 0829 09/08/16  NA 136 139 137 132*  K 3.3* 2.9* 3.6 4.7  CL 105 110 108  --   CO2 _1 --   GLUCOSE 191* 132* 140*  --   BUN _2 24*  CREATININE 0.91 0.79 0.89 1.3*  CALCIUM 10.5* 10.0 10.1  --   MG  --  1.7  --   --     Recent  Labs  11/25/15 0730 01/19/16 2053 08/30/16 1758 09/08/16  AST 14* _3 ALT 8* 10* 10*  8  ALKPHOS 44 52 50 50  BILITOT 0.2* 0.3 0.8  --   PROT 6.4* 8.1 8.4*  --   ALBUMIN 2.8* 3.8 3.5  --     Recent Labs  01/19/16 2053  08/24/16 1500 08/30/16 1758 08/31/16 0443 09/08/16  WBC 6.4  < > 5.0 8.1 5.7 8.7  NEUTROABS 3.4  --  3.0 6.3  --   --   HGB 11.2*  < > 10.5* 10.6* 8.7* 9.3*  HCT 35.6*  < > 32.0* 31.8* 27.1* 30*  MCV 71.9*  < > 68.4* 68.1* 68.3*  --   PLT 180  < > 198 220 182 194  < > = values in this interval not displayed. Lab Results  Component Value Date   TSH 3.560 04/07/2015   Lab Results  Component Value Date   HGBA1C 8.8 08/15/2016   Lab Results  Component Value Date   CHOL 96 07/10/2012   HDL 31 (L) 07/10/2012   LDLCALC 47 07/10/2012   LDLDIRECT 99 10/21/2009   TRIG 92 07/10/2012   CHOLHDL 3.1 07/10/2012   Assessment/Plan 1. Fever, unspecified fever cause Temp 99.9 .No cough noted. Bilateral Lung fields CTA. Obtain Urine specimen for U/A and C/S rule out UTI.     2. Fall at nursing home, initial encounter Reports fell trying to transfer self from the bed to her recliner hitting back of the head. No bruise or swelling noted. Continue Fall and safety precautions. Continue Neuro checks per facility fall protocol.    3. Agitation  Easily irritable. Temp 99.9.Urine specimen for U/A and C/S rule out UTI.        Family/ staff Communication: Reviewed plan of care with patient, patient's daughter and facility Nurse supervisor.   Labs/tests ordered:  Urine specimen for U/A and C/S.

## 2016-09-19 LAB — BASIC METABOLIC PANEL
BUN: 24 mg/dL — AB (ref 4–21)
Creatinine: 1.2 mg/dL — AB (ref 0.5–1.1)
GLUCOSE: 92 mg/dL
POTASSIUM: 4.5 mmol/L (ref 3.4–5.3)
Sodium: 143 mmol/L (ref 137–147)

## 2016-09-19 LAB — CBC AND DIFFERENTIAL
HCT: 25 % — AB (ref 36–46)
HEMOGLOBIN: 7.7 g/dL — AB (ref 12.0–16.0)
Platelets: 244 10*3/uL (ref 150–399)
WBC: 4.6 10*3/mL

## 2016-09-20 LAB — CBC AND DIFFERENTIAL
HCT: 28 % — AB (ref 36–46)
Hemoglobin: 8.7 g/dL — AB (ref 12.0–16.0)
PLATELETS: 278 10*3/uL (ref 150–399)
WBC: 4.8 10^3/mL

## 2016-09-25 ENCOUNTER — Encounter: Payer: Self-pay | Admitting: Internal Medicine

## 2016-09-25 ENCOUNTER — Non-Acute Institutional Stay (SKILLED_NURSING_FACILITY): Payer: Medicare Other | Admitting: Internal Medicine

## 2016-09-25 DIAGNOSIS — K449 Diaphragmatic hernia without obstruction or gangrene: Secondary | ICD-10-CM | POA: Diagnosis not present

## 2016-09-25 DIAGNOSIS — R110 Nausea: Secondary | ICD-10-CM | POA: Diagnosis not present

## 2016-09-25 DIAGNOSIS — K219 Gastro-esophageal reflux disease without esophagitis: Secondary | ICD-10-CM | POA: Diagnosis not present

## 2016-09-25 DIAGNOSIS — N3000 Acute cystitis without hematuria: Secondary | ICD-10-CM | POA: Diagnosis not present

## 2016-09-25 NOTE — Progress Notes (Signed)
[facility]ATION[facility]  PCP: Kathrine Cords, MD   Code Status: Full Code  Goals of care: Advanced Directive information Advanced Directives 09/12/2016  Does patient have an advance directive? No  Type of Advance Directive -  Does patient want to make changes to advanced directive? -  Copy of advanced directive(s) in chart? -  Would patient like information on creating an advanced directive? No - patient declined information  Pre-existing out of facility DNR order (yellow form or pink MOST form) -       Extended Emergency Contact Information Primary Emergency Contact: Duch,Lilly Address: [address]          [place], [place] [address] [place] Phone: [phone] Mobile Phone: [phone] Relation: Daughter Secondary Emergency Contact: Alain [place] Phone: [phone] Relation: Daughter   Allergies  Allergen Reactions  . Lisinopril Cough    Chief Complaint  Patient presents with  . Acute Visit    UTI and vomiting     HPI:  Patient is a 82 y.o. female seen today for Acute visit. She complains of abdominal pain. She she has been nauseous. Denies any vomiting. She had a bowel movement today. She complains of heartburn. Denies any dysuria, burning with urination, hematuria or flank pain. Of note, she is here for short term rehabilitation post hospital admission from 08/30/16-09/04/16 with altered mental state and dyspnea with bilateral pulmonary embolism and pneumonia. She is currently on antibiotic for urinary tract infection.   Review of Systems:  Constitutional: Negative for fever and diaphoresis. Positive for generalized weakness. HENT: Negative for headache, congestion, nasal discharge Respiratory: Negative for shortness of breath, cough Cardiovascular: Negative for chest pain, palpitations, leg swelling.  Gastrointestinal: Negative for nausea, vomiting. Genitourinary: Negative for dysuria    Musculoskeletal: Negative for fall in the facility.  Skin: Negative for itching, rash.  Neurological: Negative for dizziness.   Past Medical History:  Diagnosis Date  . Allergy   . Anemia    Iron def anemia  . Arthritis   . Asthma   . Breast cancer ([facility]) 10/2010   Invasive Ductal Carcinoma, s/p bilateral mastectomy, now on Tamoxifen. Followed by Dr Jamse Arn.   . Breast cancer ([facility]) 2011   s/p Bilateral masectomy  . CAD (coronary artery disease)   . Carotid artery aneurysm ([facility]) 08/2010   right ICA, 5 x 33m  . CHF (congestive heart failure) ([facility])    Systolic   . [place]   . [place] 12/11/2013  . DDD (degenerative disc disease), cervical   . Diabetes mellitus   . Diverticulosis   . GERD (gastroesophageal reflux disease)   . H/O: GI bleed   . Hiatal hernia   . History of colon cancer   . Hypercalcemia 06/09/2014  . Hyperlipidemia   . Hypertension   . Myocardial infarction   . [place]   . Pneumonia 03/2012  . Shortness of breath    Past Surgical History:  Procedure Laterality Date  . Breast masectomy  10/2010   Bilateral masectomy by Dr NLucia Gaskinss/p invasive ductal carcinoma.  .[facility] KitchenBREAST SURGERY    . Cardiac  Catherization  2007   Severe 3-vessel disease.  EF 20-25%.  . ESOPHAGOGASTRODUODENOSCOPY  2001   Esophageal Tear  . ESOPHAGOGASTRODUODENOSCOPY  10/27/2012   Procedure: ESOPHAGOGASTRODUODENOSCOPY (EGD);  Surgeon: JIrene Shipper MD;  [facility];  Service: Endoscopy;  Laterality: N/A;  pat  . JOINT REPLACEMENT Right 2001  Knee  . TOTAL KNEE ARTHROPLASTY  2001   Social History:   reports that she has never smoked. Her smokeless tobacco use includes Snuff. She reports that she does not drink alcohol or use drugs.  Family History  Problem Relation Age of Onset  . Sickle cell anemia Other   . Heart disease Mother   . Heart disease Father   . Cancer Daughter 47    breast ca  . Hypertension Daughter   . Cancer Daughter 20     breast ca  . Hypertension Daughter   . Heart disease Son     Poor circulation-Left Leg  . Diabetes Daughter   . Hypertension Daughter   . Hyperlipidemia Daughter     Poor circulation- Toe amputation    Medications:   Medication List       Accurate as of 09/25/16 12:34 PM. Always use your most recent med list.          acetaminophen 500 MG tablet Commonly known as:  TYLENOL Take 500 mg by mouth 2 (two) times daily. Take every 6 hours as needed for moderate pain   albuterol 108 (90 Base) MCG/ACT inhaler Commonly known as:  PROVENTIL HFA;VENTOLIN HFA Inhale 2 puffs into the lungs every 6 (six) hours as needed. For shortness of breath   amLODipine 5 MG tablet Commonly known as:  NORVASC TAKE 1 TABLET (5 MG TOTAL) BY MOUTH DAILY.   apixaban 5 MG Tabs tablet Commonly known as:  ELIQUIS Take 1 tablet (5 mg total) by mouth 2 (two) times daily.   ASPIRIN CHILDRENS 81 MG chewable tablet Generic drug:  aspirin Chew 81 mg by mouth daily.   BIOFREEZE EX Apply 1 application topically 2 (two) times daily as needed (pain).   bisacodyl 5 MG EC tablet Generic drug:  bisacodyl Take 5 mg by mouth every 3 (three) days.   Blood Pressure Kit Use the blood pressure kit to check you blood pressure twice a day   capsaicin 0.025 % cream Commonly known as:  ZOSTRIX APPLY TO AFFECTED AREA TWICE A DAY   carvedilol 12.5 MG tablet Commonly known as:  COREG TAKE 1 TABLET (12.5 MG TOTAL) BY MOUTH 2 (TWO) TIMES DAILY WITH A MEAL.   DECUBI-VITE Caps Take 1 capsule by mouth daily. Stop date 10/12/16   diclofenac sodium 1 % Gel Commonly known as:  VOLTAREN APPLY 2 grams topically 4 TIMES DAILY AS NEEDED FOR PAIN   docusate sodium 100 MG capsule Commonly known as:  COLACE Take 1 capsule (100 mg total) by mouth daily.   feeding supplement (PRO-STAT SUGAR FREE 64) Liqd Take 30 mLs by mouth 2 (two) times daily with a meal.   ferrous sulfate 325 (65 FE) MG tablet Take 325 mg by mouth  daily with breakfast.   gabapentin 300 MG capsule Commonly known as:  NEURONTIN Take 1 capsule (300 mg total) by mouth 2 (two) times daily.   glucose blood test strip Use as instructed Dx. Code 250.02   guaifenesin 100 MG/5ML syrup Commonly known as:  ROBITUSSIN Take 10 mLs (200 mg total) by mouth 3 (three) times daily as needed for cough.   loratadine 10 MG tablet Commonly known as:  CLARITIN TAKE 1/2 TABLET BY MOUTH DAILY   losartan 50 MG tablet Commonly known as:  COZAAR TAKE 1 TABLET (50 MG TOTAL) BY MOUTH AT BEDTIME.   nitrofurantoin 100 MG capsule Commonly known as:  MACRODANTIN Take 100 mg by mouth 2 (two) times daily. Stop  date 09/29/16   PATADAY 0.2 % Soln Generic drug:  Olopatadine HCl APPLY 1 DROP TO EYE DAILY.   ranitidine 150 MG capsule Commonly known as:  ZANTAC TAKE ONE CAPSULE BY MOUTH TWICE A DAY AS NEEDED HEARTBURN   rosuvastatin 20 MG tablet Commonly known as:  CRESTOR Take 1 tablet (20 mg total) by mouth at bedtime.   saccharomyces boulardii 250 MG capsule Commonly known as:  FLORASTOR Take 250 mg by mouth 2 (two) times daily. Stop date 10/02/16   traMADol 50 MG tablet Commonly known as:  ULTRAM Take 1 tablet (50 mg total) by mouth every 8 (eight) hours as needed for severe pain.       Immunizations: Immunization History  Administered Date(s) Administered  . Influenza Split 08/18/2011, 08/06/2012  . Influenza Whole 08/04/2008, 12/08/2009  . Influenza,inj,Quad PF,36+ Mos 11/13/2013, 08/16/2014, 08/06/2015, 08/15/2016  . PPD Test 09/04/2016  . Pneumococcal Conjugate-13 08/27/2015  . Pneumococcal Polysaccharide-23 01/11/1997  . Td 04/14/1999     Physical Exam:  Vitals:   09/25/16 1219  BP: (!) 145/68  Pulse: 90  Resp: 20  Temp: 98.6 F (37 C)  TempSrc: Oral  SpO2: 97%  Weight: 109 lb 3.2 oz (49.5 kg)  Height: '4\' 11"'$  (1.499 m)   Body mass index is 22.06 kg/m.  General- elderly female, thin built, in no acute distress Head-  normocephalic, atraumatic Nose- no nasal discharge Throat- moist mucus membrane Eyes- PERRLA, EOMI, no pallor, no icterus Neck- no cervical lymphadenopathy Cardiovascular- normal s1,s2, no murmur Respiratory- bilateral clear to auscultation Abdomen- bowel sounds present, soft, no epigastric tenderness, no guarding or rigidity, no suprapubic tenderness, no CVA tenderness Musculoskeletal- able to move all 4 extremities, generalized weakness Neurological- alert and oriented to person and place Skin- warm and dry    Labs reviewed: Basic Metabolic Panel:  Recent Labs  08/30/16 1758 08/31/16 0443 09/01/16 0829 09/08/16 09/19/16  NA 136 139 137 132* 143  K 3.3* 2.9* 3.6 4.7 4.5  CL 105 110 108  --   --   CO2 '23 23 24  '$ --   --   GLUCOSE 191* 132* 140*  --   --   BUN '12 10 10 '$ 24* 24*  CREATININE 0.91 0.79 0.89 1.3* 1.2*  CALCIUM 10.5* 10.0 10.1  --   --   MG  --  1.7  --   --   --    Liver Function Tests:  Recent Labs  11/25/15 0730 01/19/16 2053 08/30/16 1758 09/08/16  AST 14* '17 19 15  '$ ALT 8* 10* 10* 8  ALKPHOS 44 52 50 50  BILITOT 0.2* 0.3 0.8  --   PROT 6.4* 8.1 8.4*  --   ALBUMIN 2.8* 3.8 3.5  --    No results for input(s): LIPASE, AMYLASE in the last 8760 hours. No results for input(s): AMMONIA in the last 8760 hours. CBC:  Recent Labs  01/19/16 2053  08/24/16 1500 08/30/16 1758 08/31/16 0443 09/08/16 09/19/16 09/20/16  WBC 6.4  < > 5.0 8.1 5.7 8.7 4.6 4.8  NEUTROABS 3.4  --  3.0 6.3  --   --   --   --   HGB 11.2*  < > 10.5* 10.6* 8.7* 9.3* 7.7* 8.7*  HCT 35.6*  < > 32.0* 31.8* 27.1* 30* 25* 28*  MCV 71.9*  < > 68.4* 68.1* 68.3*  --   --   --   PLT 180  < > 198 220 182 194 244 278  < > =  values in this interval not displayed. Cardiac Enzymes:  Recent Labs  11/25/15 1110  TROPONINI <0.03   BNP: Invalid input(s): POCBNP CBG:  Recent Labs  09/03/16 2115 09/04/16 0556 09/04/16 1152  GLUCAP 192* 124* 165*    Radiological Exams: Dg Chest 2  View  Result Date: 08/30/2016 CLINICAL DATA:  Altered mental status starting this morning EXAM: CHEST  2 VIEW COMPARISON:  08/24/2016 FINDINGS: Cardiomegaly is noted. Elevation of the right hemidiaphragm. No acute infiltrate or pleural effusion. No pulmonary edema. Degenerative changes thoracic spine. IMPRESSION: No active cardiopulmonary disease. Electronically Signed   By: Lahoma Crocker M.D.   On: 08/30/2016 17:09   Ct Angio Chest Pe W And/or Wo Contrast  Result Date: 08/30/2016 CLINICAL DATA:  Low oxygen saturation. Recently diagnosed with pneumonia. EXAM: CT ANGIOGRAPHY CHEST WITH CONTRAST TECHNIQUE: Multidetector CT imaging of the chest was performed using the standard protocol during bolus administration of intravenous contrast. Multiplanar CT image reconstructions and MIPs were obtained to evaluate the vascular anatomy. CONTRAST:  75 mL Isovue 370 IV COMPARISON:  01/21/2013 chest CT and same day chest radiograph FINDINGS: Cardiovascular: Acute nonocclusive pulmonary emboli are noted on the right, chest distal to the right main pulmonary artery extending into the right lower lobe lobar, segmental and subsegmental pulmonary arteries. Small subsegmental Edit occlusive left lower lobe pulmonary embolus is also identified. The RV to LV ratio is approximately 0.84. The heart is top normal. Coronary arteriosclerosis. No pericardial effusion. Aortic atherosclerosis. No aneurysm. Mediastinum/Nodes: 1 cm short axis right lower paratracheal lymph node. Similar sized aorticopulmonary short axis lymph nodes. Small right hilar lymph nodes. Small hiatal hernia with mild-to-moderate diffuse thickening of the esophagus possibly inflammatory. Lungs/Pleura: No pneumonic consolidations. Lingular, right middle lobe and left upper lobe atelectasis. Upper Abdomen: Mild thickening of the left adrenal apex. No acute abnormality. Musculoskeletal: Degenerative disc disease along the visualized dorsal spine. No acute osseous  abnormality. Review of the MIP images confirms the above findings. IMPRESSION: Bilateral lower lobe pulmonary emboli, nonocclusive involving right lobar through subsegmental pulmonary arteries to the right lower lobe and occlusive subsegmental left lower lobe pulmonary arteries. The RV to LV ratio slightly under the cutoff for submassive PE. Critical Value/emergent results were called by telephone at the time of interpretation on 08/30/2016 at 7:53 pm to Dr. Shirlyn Goltz , who verbally acknowledged these results. Electronically Signed   By: Ashley Royalty M.D.   On: 08/30/2016 19:54    Assessment/Plan  Nausea without vomiting Being on antibiotic could have contributed some. Has completed 3 days of antibiotic for urinary tract infection. Patient is currently asymptomatic of urinary symptom. Discontinue her antibiotic and have her on Zofran 4 mg every 8 hours as needed for nausea and monitor. No signs of acute abdomen on exam.  Acute cystitis Currently symptom-free. Has been on Macrobid for 3 days. No bacterial growth on urine culture report. Hydration and perineal hygiene to be maintained. Discontinue her Macrobid for now. Monitor clinically.   Gastroesophageal reflux disease Has history of hiatal hernia. Being on antibiotic, her symptoms could have worsened. No signs of acute abdomen on exam. With her complains of increased heartburn, change her current ranitidine into 150 mg twice a day. Start omeprazole 20 mg daily in the morning for 2 weeks and monitor. Will also place home on Zofran 4 mg every 8 hours as needed for nausea  Family/ staff Communication: reviewed care plan with patient and nursing supervisor    Blanchie Serve, MD Internal Medicine [facility]  [place], [place] [address] Cell Phone (Monday-Friday 8 am - 5 pm): [phone] On Call: [phone] and follow prompts after 5 pm and on weekends Office Phone: [phone] Office Fax:  [phone]

## 2016-09-26 ENCOUNTER — Other Ambulatory Visit: Payer: Self-pay | Admitting: *Deleted

## 2016-09-26 NOTE — Patient Outreach (Signed)
[facility]) Care Management  09/26/2016  [name] 02/02/1924 [id number]   CSW was able to meet with patient today at [facility] [place] where patient currently resides to receive short-term rehabilitative services, prior to returning home to live.  Patient's daughter, [name] was not present at the time of CSW's visit.  Patient did not remember having met with CSW two weeks prior.  CSW tried to have a conversation with patient, but patient appeared to be quite confused today.  CSW left a HIPAA compliant message for [name] at patient's bedside, encouraging her to contact CSW at her convenience so that [name] can assist with possible discharge planning needs and services.  CSW will plan to meet with patient and [name] for the next scheduled visit on Tuesday, November 28th at [facility]. Nat Christen, BSW, MSW, LCSW  Licensed Education officer, [facility]  Mailing [place] [address], [place], [place] [address] Physical Address-[address], [place], [place] [address] Toll Free Main # [phone] Fax # [phone] Cell # [phone]  Fax # [phone]  [name]

## 2016-09-29 ENCOUNTER — Non-Acute Institutional Stay (SKILLED_NURSING_FACILITY): Payer: Medicare Other | Admitting: Family

## 2016-09-29 DIAGNOSIS — I2782 Chronic pulmonary embolism: Secondary | ICD-10-CM | POA: Diagnosis not present

## 2016-09-29 DIAGNOSIS — I5022 Chronic systolic (congestive) heart failure: Secondary | ICD-10-CM

## 2016-09-29 DIAGNOSIS — G629 Polyneuropathy, unspecified: Secondary | ICD-10-CM | POA: Diagnosis not present

## 2016-09-29 DIAGNOSIS — I10 Essential (primary) hypertension: Secondary | ICD-10-CM

## 2016-09-29 DIAGNOSIS — D509 Iron deficiency anemia, unspecified: Secondary | ICD-10-CM | POA: Diagnosis not present

## 2016-09-29 DIAGNOSIS — M150 Primary generalized (osteo)arthritis: Secondary | ICD-10-CM

## 2016-09-29 DIAGNOSIS — R2681 Unsteadiness on feet: Secondary | ICD-10-CM

## 2016-09-29 DIAGNOSIS — E782 Mixed hyperlipidemia: Secondary | ICD-10-CM

## 2016-09-29 DIAGNOSIS — E119 Type 2 diabetes mellitus without complications: Secondary | ICD-10-CM | POA: Diagnosis not present

## 2016-09-29 DIAGNOSIS — M159 Polyosteoarthritis, unspecified: Secondary | ICD-10-CM

## 2016-09-29 NOTE — Progress Notes (Signed)
Location:  [place] Room Number: 407 Place of Service:  SNF [phone])  Provider: Marlowe Sax FNP-C   PCP: Kathrine Cords, MD Patient Care Team: [name], MD as PCP - General Heath Lark, MD as Consulting Physician (Hematology and Oncology) Francis Gaines, LCSW as [place] [facility] (Licensed Clinical Social Worker)  Extended Emergency Contact Information Primary Emergency Contact: [place] Address: [address]          [place], [place] [address] [place] Phone: [phone] Mobile Phone: [phone] Relation: Daughter Secondary Emergency Contact: [place] Phone: [phone] Relation: Daughter  Code Status: Full Code  Goals of care:  Advanced Directive information Advanced Directives 09/12/2016  Does patient have an advance directive? No  Type of Advance Directive -  Does patient want to make changes to advanced directive? -  Copy of advanced directive(s) in chart? -  Would patient like information on creating an advanced directive? No - patient declined information  Pre-existing out of facility DNR order (yellow form or pink MOST form) -     Allergies  Allergen Reactions  . Lisinopril Cough    Chief Complaint  Patient presents with  . Discharge Note    Discharge home     HPI:  82 y.o. female seen today at [facility] for discharge home.She was here for short term rehabilitation post hospital admission from 08/30/16-09/04/16 with altered mental state and dyspnea. She was diagnosed with bilateral pulmonary embolism and pneumonia. She was started on heparin and antibiotics.Eliquis started.She had elevated CBG reading and was placed on SSI but per pt request, this was discontinued.She has a medical history of HTN, CAD, Hyperlipidemia, Anemia, MI, CHF, GERD, DM, among other conditions. She is seen in her room today. She denies any  acute issues this visit. During her rehab stay she sustained a fall while trying to transfer self from her bed to recliner. No injuries. She was treated with Macrobid for UTI 09/22/2016. She has worked well with PT/OT now stable for discharge home.She will be discharged home with Home health PT/OT to continue with ROM, Exercise, Gait stability and muscle strengthening. She does not require any DME states has own walker at home. Home health services will be arranged by facility social worker prior to discharge. Prescription medication will be written x 1 month then patient to follow up with PCP in 1-2 weeks. Facility staff report no new concerns.       Past Medical History:  Diagnosis Date  . Allergy   . Anemia    Iron def anemia  . Arthritis   . Asthma   . Breast cancer ([place]) 10/2010   Invasive Ductal Carcinoma, s/p bilateral mastectomy, now on Tamoxifen. Followed by Dr Jamse Arn.   . Breast cancer ([place]) 2011   s/p Bilateral masectomy  . CAD (coronary artery disease)   . Carotid artery aneurysm ([place]) 08/2010   right ICA, 5 x 33m  . CHF (congestive heart failure) (HCC)    Systolic   . [place] ([place]   . Colon cancer ([place] 12/11/2013  . DDD (degenerative disc disease), cervical   . Diabetes mellitus   . Diverticulosis   . GERD (gastroesophageal reflux disease)   . H/O: GI bleed   . Hiatal hernia   . History of colon cancer   . Hypercalcemia 06/09/2014  . Hyperlipidemia   . Hypertension   . Myocardial infarction   .  Neuropathy ([place])   . Pneumonia 03/2012  . Shortness of breath     Past Surgical History:  Procedure Laterality Date  . Breast masectomy  10/2010   Bilateral masectomy by Dr Lucia Gaskins s/p invasive ductal carcinoma.  Marland Kitchen BREAST SURGERY    . Cardiac  Catherization  2007   Severe 3-vessel disease.  EF 20-25%.  . ESOPHAGOGASTRODUODENOSCOPY  2001   Esophageal Tear  . ESOPHAGOGASTRODUODENOSCOPY  10/27/2012   Procedure: ESOPHAGOGASTRODUODENOSCOPY (EGD);  Surgeon: Irene Shipper, MD;  Location: [facility] END[facility]COPY;  Service: Endoscopy;  Laterality: N/A;  pat  . JOINT REPLACEMENT Right 2001   Knee  . TOTAL KNEE ARTHROPLASTY  2001      reports that she has never smoked. Her smokeless tobacco use includes Snuff. She reports that she does not drink alcohol or use drugs. Social History   Social History  . Marital status: Widowed    Spouse name: N/A  . Number of children: N/A  . Years of education: N/A   Occupational History  . Not on file.   Social History Main Topics  . Smoking status: Never Smoker  . Smokeless tobacco: Current User    Types: Snuff  . Alcohol use No  . Drug use: No  . Sexual activity: No   Other Topics Concern  . Not on file   Social History Narrative   Widowed.  She lives with her son and has 2 daughters who help with her care.  She has a large extended family in the area who are very involved.       Allergies  Allergen Reactions  . Lisinopril Cough    Pertinent  Health Maintenance Due  Topic Date Due  . OPHTHALMOLOGY EXAM  11/18/2015  . HEMOGLOBIN A1C  02/13/2017  . FOOT EXAM  08/15/2017  . INFLUENZA VACCINE  Completed  . DEXA SCAN  Completed  . PNA vac Low Risk Adult  Completed    Medications:   Medication List       Accurate as of 09/29/16  6:56 PM. Always use your most recent med list.          acetaminophen 500 MG tablet Commonly known as:  TYLENOL Take 500 mg by mouth 2 (two) times daily. Take every 6 hours as needed for moderate pain   albuterol 108 (90 Base) MCG/ACT inhaler Commonly known as:  PROVENTIL HFA;VENTOLIN HFA Inhale 2 puffs into the lungs every 6 (six) hours as needed. For shortness of breath   amLODipine 5 MG tablet Commonly known as:  NORVASC TAKE 1 TABLET (5 MG TOTAL) BY MOUTH DAILY.   apixaban 5 MG Tabs tablet Commonly known as:  ELIQUIS Take 1 tablet (5 mg total) by mouth 2 (two) times daily.   ASPIRIN CHILDRENS 81 MG chewable tablet Generic drug:  aspirin Chew 81 mg by mouth  daily.   BIOFREEZE EX Apply 1 application topically 2 (two) times daily as needed (pain).   bisacodyl 5 MG EC tablet Generic drug:  bisacodyl Take 5 mg by mouth every 3 (three) days.   Blood Pressure Kit Use the blood pressure kit to check you blood pressure twice a day   capsaicin 0.025 % cream Commonly known as:  ZOSTRIX APPLY TO AFFECTED AREA TWICE A DAY   carvedilol 12.5 MG tablet Commonly known as:  COREG TAKE 1 TABLET (12.5 MG TOTAL) BY MOUTH 2 (TWO) TIMES DAILY WITH A MEAL.   DECUBI-VITE Caps Take 1 capsule by mouth daily. Stop date 10/12/16  diclofenac sodium 1 % Gel Commonly known as:  VOLTAREN APPLY 2 grams topically 4 TIMES DAILY AS NEEDED FOR PAIN   docusate sodium 100 MG capsule Commonly known as:  COLACE Take 1 capsule (100 mg total) by mouth daily.   feeding supplement (PRO-STAT SUGAR FREE 64) Liqd Take 30 mLs by mouth 2 (two) times daily with a meal.   ferrous sulfate 325 (65 FE) MG tablet Take 325 mg by mouth daily with breakfast.   gabapentin 300 MG capsule Commonly known as:  NEURONTIN Take 1 capsule (300 mg total) by mouth 2 (two) times daily.   glucose blood test strip Use as instructed Dx. Code 250.02   guaifenesin 100 MG/5ML syrup Commonly known as:  ROBITUSSIN Take 10 mLs (200 mg total) by mouth 3 (three) times daily as needed for cough.   loratadine 10 MG tablet Commonly known as:  CLARITIN TAKE 1/2 TABLET BY MOUTH DAILY   losartan 50 MG tablet Commonly known as:  COZAAR TAKE 1 TABLET (50 MG TOTAL) BY MOUTH AT BEDTIME.   PATADAY 0.2 % Soln Generic drug:  Olopatadine HCl APPLY 1 DROP TO EYE DAILY.   ranitidine 150 MG capsule Commonly known as:  ZANTAC TAKE ONE CAPSULE BY MOUTH TWICE A DAY AS NEEDED HEARTBURN   rosuvastatin 20 MG tablet Commonly known as:  CRESTOR Take 1 tablet (20 mg total) by mouth at bedtime.   saccharomyces boulardii 250 MG capsule Commonly known as:  FLORASTOR Take 250 mg by mouth 2 (two) times daily.  Stop date 10/02/16   traMADol 50 MG tablet Commonly known as:  ULTRAM Take 1 tablet (50 mg total) by mouth every 8 (eight) hours as needed for severe pain.       Review of Systems  Constitutional: Negative for activity change, appetite change, chills, fatigue and fever.  HENT: Negative for congestion, rhinorrhea, sinus pressure, sneezing and sore throat.   Eyes: Negative.   Respiratory: Negative for cough, chest tightness, shortness of breath and wheezing.   Cardiovascular: Negative for chest pain, palpitations and leg swelling.  Gastrointestinal: Negative for abdominal distention, abdominal pain, constipation, diarrhea, nausea and vomiting.  Endocrine: Negative.   Genitourinary: Negative for dysuria, frequency and urgency.  Musculoskeletal: Positive for gait problem.  Skin: Negative for color change, pallor and rash.  Neurological: Negative for dizziness, seizures, syncope, light-headedness and headaches.  Hematological: Does not bruise/bleed easily.  Psychiatric/Behavioral: Negative for agitation, confusion, hallucinations and sleep disturbance. The patient is not nervous/anxious.     Vitals:   09/29/16 1820  BP: 125/79  Pulse: 88  Resp: 18  Temp: 97 F (36.1 C)  SpO2: 96%  Weight: 108 lb 9.6 oz (49.3 kg)  Height: '4\' 11"'$  (1.499 m)   Body mass index is 21.93 kg/m. Physical Exam  Constitutional: She is oriented to person, place, and time. She appears well-developed and well-nourished. No distress.  HENT:  Head: Normocephalic.  Mouth/Throat: Oropharynx is clear and moist.  Eyes: Conjunctivae and EOM are normal. Pupils are equal, round, and reactive to light. Right eye exhibits no discharge. Left eye exhibits no discharge. No scleral icterus.  Neck: Normal range of motion. No JVD present. No thyromegaly present.  Cardiovascular: Normal rate, regular rhythm, normal heart sounds and intact distal pulses.  Exam reveals no gallop and no friction rub.   No murmur  heard. Pulmonary/Chest: Effort normal and breath sounds normal. No respiratory distress. She has no wheezes. She has no rales.  Abdominal: Soft. Bowel sounds are normal. She exhibits  no distension. There is no tenderness. There is no rebound and no guarding.  Musculoskeletal: She exhibits no edema, tenderness or deformity.  Moves X 4 extremities. Unsteady gait.   Lymphadenopathy:    She has no cervical adenopathy.  Neurological: She is oriented to person, place, and time.  Skin: Skin is warm and dry. No rash noted. No erythema. No pallor.  Psychiatric: She has a normal mood and affect.    Labs reviewed: Basic Metabolic Panel:  Recent Labs  08/30/16 1758 08/31/16 0443 09/01/16 0829 09/08/16 09/19/16  NA 136 139 137 132* 143  K 3.3* 2.9* 3.6 4.7 4.5  CL 105 110 108  --   --   CO2 '23 23 24  '$ --   --   GLUCOSE 191* 132* 140*  --   --   BUN '12 10 10 '$ 24* 24*  CREATININE 0.91 0.79 0.89 1.3* 1.2*  CALCIUM 10.5* 10.0 10.1  --   --   MG  --  1.7  --   --   --    Liver Function Tests:  Recent Labs  11/25/15 0730 01/19/16 2053 08/30/16 1758 09/08/16  AST 14* '17 19 15  '$ ALT 8* 10* 10* 8  ALKPHOS 44 52 50 50  BILITOT 0.2* 0.3 0.8  --   PROT 6.4* 8.1 8.4*  --   ALBUMIN 2.8* 3.8 3.5  --     Recent Labs  01/19/16 2053  08/24/16 1500 08/30/16 1758 08/31/16 0443 09/08/16 09/19/16 09/20/16  WBC 6.4  < > 5.0 8.1 5.7 8.7 4.6 4.8  NEUTROABS 3.4  --  3.0 6.3  --   --   --   --   HGB 11.2*  < > 10.5* 10.6* 8.7* 9.3* 7.7* 8.7*  HCT 35.6*  < > 32.0* 31.8* 27.1* 30* 25* 28*  MCV 71.9*  < > 68.4* 68.1* 68.3*  --   --   --   PLT 180  < > 198 220 182 194 244 278  < > = values in this interval not displayed. Cardiac Enzymes:  Recent Labs  11/25/15 1110  TROPONINI <0.03    Recent Labs  09/03/16 2115 09/04/16 0556 09/04/16 1152  GLUCAP 192* 124* 165*   Assessment/Plan:   1. Hypertension B/p stable. Continue on Amlodipine, carvedilol and Losartan. BMP in 1-2 weeks with PCP.    2. Chronic systolic heart failure (HCC) Stable. No abrupt weight gain. Exam findings negative for edema, wheezing, rales or cough. Continue on carvedilol. Daily weight.   3. Mixed hyperlipidemia Continue on Crestor. Lipid panel with PCP.   4. Primary osteoarthritis involving multiple joints Continue Tylenol, Biofreeze gel and Tramadol PRN  5. Neuropathy (HCC) Continue on Gabapentin and Capsaicin cream.   6. Controlled type 2 diabetes mellitus without complication, without long-term current ureadingse of insulin ([place]) Diet controlled. CBG elevated during recent hospital admission was placed on SSI but per pt request, this was discontinued. Continue to monitor.   7.  pulmonary embolism without acute cor pulmonale ([place]) Status post short term rehabilitation post hospital admission from 08/30/16-09/04/16 with altered mental state and dyspnea. She was diagnosed with bilateral pulmonary embolism and pneumonia. She was started on heparin and antibiotics.Discharged to Rehab on Eliquis.Had recent follow up with specialist EliQuis increased to 5 mg Tablet twice daily. She is to continue on EliQuis for six months.   8. Unsteady gait Status post short term rehabilitation post hospital admission. She has worked well with PT/OT now stable for discharge home.She will be  discharged home with Home health PT/OT to continue with ROM, Exercise, Gait stability and muscle strengthening. She does not require any DME states has own walker at home.Fall and safety precautions.  9. Anemia  Recent Hgb 8.7 (09/20/2016) previous 7.7 Continue on Ferrous sulfate 325 mg Tablet one by mouth daily. CBC in 1-2 weeks with PCP.   Patient is being discharged with the following home health services:    -PT/OT for ROM, Exercise, Gait stability and muscle strengthening.   Patient is being discharged with the following durable medical equipment:   -No DME required has own walker.    Patient has been advised to f/u with  their PCP in 1-2 weeks to for a transitions of care visit.  Social services at their facility was responsible for arranging this appointment.  Pt was provided with adequate prescriptions of noncontrolled medications to reach the scheduled appointment .  For controlled substances, a limited supply was provided as appropriate for the individual patient.  If the pt normally receives these medications from a pain clinic or has a contract with another physician, these medications should be received from that clinic or physician only).    Future labs/tests needed:  CBC, BMP in 1-2 weeks with PCP

## 2016-10-03 ENCOUNTER — Encounter (INDEPENDENT_AMBULATORY_CARE_PROVIDER_SITE_OTHER): Payer: Medicare Other | Admitting: Podiatry

## 2016-10-03 ENCOUNTER — Telehealth: Payer: Self-pay | Admitting: Family Medicine

## 2016-10-03 DIAGNOSIS — I5020 Unspecified systolic (congestive) heart failure: Secondary | ICD-10-CM | POA: Diagnosis not present

## 2016-10-03 DIAGNOSIS — L89620 Pressure ulcer of left heel, unstageable: Secondary | ICD-10-CM | POA: Diagnosis not present

## 2016-10-03 DIAGNOSIS — J189 Pneumonia, unspecified organism: Secondary | ICD-10-CM | POA: Diagnosis not present

## 2016-10-03 DIAGNOSIS — E1140 Type 2 diabetes mellitus with diabetic neuropathy, unspecified: Secondary | ICD-10-CM | POA: Diagnosis not present

## 2016-10-03 DIAGNOSIS — L89610 Pressure ulcer of right heel, unstageable: Secondary | ICD-10-CM | POA: Diagnosis not present

## 2016-10-03 DIAGNOSIS — I110 Hypertensive heart disease with heart failure: Secondary | ICD-10-CM | POA: Diagnosis not present

## 2016-10-03 NOTE — Progress Notes (Signed)
This encounter was created in error - please disregard.

## 2016-10-03 NOTE — Telephone Encounter (Signed)
Eric from [facility] called and is requesting verbal orders for the patient. He would like PT and OT 2 times a week for 5 weeks. Please call Randall Hiss at [phone], [place] Rich

## 2016-10-04 NOTE — Telephone Encounter (Signed)
Called and provided verbal orders on voicemail. Patient recently discharged from nursing home per EMR review.  [name], MD [facility] Family Medicine Resident  10/04/2016, 11:14 AM

## 2016-10-09 DIAGNOSIS — L89610 Pressure ulcer of right heel, unstageable: Secondary | ICD-10-CM | POA: Diagnosis not present

## 2016-10-09 DIAGNOSIS — I110 Hypertensive heart disease with heart failure: Secondary | ICD-10-CM | POA: Diagnosis not present

## 2016-10-09 DIAGNOSIS — J189 Pneumonia, unspecified organism: Secondary | ICD-10-CM | POA: Diagnosis not present

## 2016-10-09 DIAGNOSIS — L89620 Pressure ulcer of left heel, unstageable: Secondary | ICD-10-CM | POA: Diagnosis not present

## 2016-10-09 DIAGNOSIS — I5020 Unspecified systolic (congestive) heart failure: Secondary | ICD-10-CM | POA: Diagnosis not present

## 2016-10-09 DIAGNOSIS — E1140 Type 2 diabetes mellitus with diabetic neuropathy, unspecified: Secondary | ICD-10-CM | POA: Diagnosis not present

## 2016-10-10 ENCOUNTER — Other Ambulatory Visit: Payer: Self-pay | Admitting: *Deleted

## 2016-10-10 NOTE — Patient Outreach (Signed)
[facility]) Care Management  10/10/2016  [name] 1924-03-26 [id number]    CSW was able to make contact with patient by phone today, after CSW drove out to [name] [place] where patient was residing to receive short-term rehabilitative services, only to find that patient was no longer there.  Patient's daughter, [name] answered the phone, indicating that patient was discharged from the facility several days ago, returning home to live with home health services.  Home Health Physical Therapy and Occupational Therapy were arranged for patient through [facility], 2 times per week, for a total of 5 weeks.  [name] denies having an additional social work needs at present.  CSW explained to [name] that she will be contacted by an RNCM with [facility] to receive disease management services for patient. CSW will perform a case closure on patient, as all goals of treatment have been met from social work standpoint and no additional social work needs have been identified at this time.  CSW will notify patient's RNCM with [facility], of CSW's plans to close patient's case.  CSW will fax an update to patient's Primary Care Physician, Dr. Kathrine Cords to ensure that they are aware of CSW's involvement with patient's plan of care.  CSW will submit a case closure request to [name], Care Management Assistant with [facility], in the form of an [employer]. CSW will ensure that [name] is aware of patient's RNCM with [facility] [employer] [facility] [employer], continued involvement with patient's care. Nat Christen, BSW, MSW, LCSW  Licensed Education officer, [facility]  Mailing [place] [address], [place], [place] [address] Physical Address-[address], [place], [place] [address] Toll  Free Main # [phone] Fax # [phone] Cell # [phone]  Fax # [phone]  [name]

## 2016-10-10 NOTE — Addendum Note (Signed)
Addended by: Nat Christen D on: 10/10/2016 08:08 PM   Modules accepted: Orders

## 2016-10-11 ENCOUNTER — Other Ambulatory Visit: Payer: Self-pay

## 2016-10-11 DIAGNOSIS — I110 Hypertensive heart disease with heart failure: Secondary | ICD-10-CM | POA: Diagnosis not present

## 2016-10-11 DIAGNOSIS — J189 Pneumonia, unspecified organism: Secondary | ICD-10-CM | POA: Diagnosis not present

## 2016-10-11 DIAGNOSIS — I5020 Unspecified systolic (congestive) heart failure: Secondary | ICD-10-CM | POA: Diagnosis not present

## 2016-10-11 DIAGNOSIS — L89620 Pressure ulcer of left heel, unstageable: Secondary | ICD-10-CM | POA: Diagnosis not present

## 2016-10-11 DIAGNOSIS — E1140 Type 2 diabetes mellitus with diabetic neuropathy, unspecified: Secondary | ICD-10-CM | POA: Diagnosis not present

## 2016-10-11 DIAGNOSIS — L89610 Pressure ulcer of right heel, unstageable: Secondary | ICD-10-CM | POA: Diagnosis not present

## 2016-10-11 NOTE — Patient Outreach (Signed)
[facility]) Care Management [place] Telephone Outreach, Transition of Care Attempt # 1  10/11/16  [name] July 29, 1924 [id number]   RNCM received referral on 10/11/2016 to contact patient for transition of care.  Patient hospitalized from 10/18 /17-10/23/2017 for altered mental state and dyspnea and was diagnosed with pulmonary embolism and pneumonia. Patient was then admitted to [facility] for short term rehab. Patient was discharged home on 09/29/2016 per record review with home health PT and OT and no DME (has own walker at home). Patient is to follow up with PCP in 1-2 weeks.  Attempted to reach patient without success. Phone rang multiple times with no answer and no option to leave a voicemail.  [place] R. Suki Crockett, RN, BSN, [place] [facility] Coordinator [phone]

## 2016-10-12 ENCOUNTER — Other Ambulatory Visit: Payer: Self-pay

## 2016-10-13 ENCOUNTER — Encounter: Payer: Self-pay | Admitting: Family Medicine

## 2016-10-13 ENCOUNTER — Ambulatory Visit (INDEPENDENT_AMBULATORY_CARE_PROVIDER_SITE_OTHER): Payer: Medicare Other | Admitting: Family Medicine

## 2016-10-13 VITALS — BP 158/66 | HR 93 | Temp 98.6°F

## 2016-10-13 DIAGNOSIS — I10 Essential (primary) hypertension: Secondary | ICD-10-CM

## 2016-10-13 DIAGNOSIS — E876 Hypokalemia: Secondary | ICD-10-CM | POA: Insufficient documentation

## 2016-10-13 DIAGNOSIS — I2510 Atherosclerotic heart disease of native coronary artery without angina pectoris: Secondary | ICD-10-CM

## 2016-10-13 DIAGNOSIS — M150 Primary generalized (osteo)arthritis: Secondary | ICD-10-CM

## 2016-10-13 DIAGNOSIS — M159 Polyosteoarthritis, unspecified: Secondary | ICD-10-CM

## 2016-10-13 DIAGNOSIS — Z7901 Long term (current) use of anticoagulants: Secondary | ICD-10-CM

## 2016-10-13 DIAGNOSIS — I2782 Chronic pulmonary embolism: Secondary | ICD-10-CM

## 2016-10-13 DIAGNOSIS — M546 Pain in thoracic spine: Secondary | ICD-10-CM

## 2016-10-13 LAB — CBC
HEMATOCRIT: 32.4 % — AB (ref 35.0–45.0)
HEMOGLOBIN: 9.4 g/dL — AB (ref 11.7–15.5)
MCH: 22 pg — ABNORMAL LOW (ref 27.0–33.0)
MCHC: 29 g/dL — ABNORMAL LOW (ref 32.0–36.0)
MCV: 75.9 fL — AB (ref 80.0–100.0)
MPV: 9.6 fL (ref 7.5–12.5)
Platelets: 210 10*3/uL (ref 140–400)
RBC: 4.27 MIL/uL (ref 3.80–5.10)
RDW: 20.5 % — ABNORMAL HIGH (ref 11.0–15.0)
WBC: 5.3 10*3/uL (ref 3.8–10.8)

## 2016-10-13 LAB — BASIC METABOLIC PANEL WITH GFR
BUN: 14 mg/dL (ref 7–25)
CHLORIDE: 106 mmol/L (ref 98–110)
CO2: 22 mmol/L (ref 20–31)
Calcium: 10.3 mg/dL (ref 8.6–10.4)
Creat: 1.03 mg/dL — ABNORMAL HIGH (ref 0.60–0.88)
GFR, Est African American: 55 mL/min — ABNORMAL LOW (ref >=60)
GFR, Est Non African American: 48 mL/min — ABNORMAL LOW (ref >=60)
GLUCOSE: 274 mg/dL — AB (ref 65–99)
POTASSIUM: 4.1 mmol/L (ref 3.5–5.3)
Sodium: 136 mmol/L (ref 135–146)

## 2016-10-13 MED ORDER — TRAMADOL HCL 50 MG PO TABS: 50.0000 mg | ORAL_TABLET | Freq: Three times a day (TID) | ORAL | 0 refills | Status: DC | PRN

## 2016-10-13 MED ORDER — DOCUSATE SODIUM 100 MG PO CAPS: 100.0000 mg | ORAL_CAPSULE | Freq: Two times a day (BID) | ORAL | 0 refills | Status: DC | PRN

## 2016-10-13 NOTE — Assessment & Plan Note (Signed)
Noted to be hypokalemic on admission. This had resolved by the time of discharge. -We'll check a BMP today.

## 2016-10-13 NOTE — Assessment & Plan Note (Signed)
Suspect the patient's shoulder pain is more secondary to osteoarthritis than any rotator cuff injury. No red flags on exam or history. Discussed scheduling Tylenol daily and then using tramadol as needed for breakthrough pain. Discussed that if she continues to take tramadol, she should take a daily Colace as well to prevent constipation. The patient will continue to work with physical therapy.

## 2016-10-13 NOTE — Patient Instructions (Signed)
It was good to see you. Start taking Tylenol for the pain. If Tylenol is not helping, take Tramadol and Colace.  Talk to your physical therapist about your walker.  Follow up with me in 1 month.

## 2016-10-13 NOTE — Assessment & Plan Note (Signed)
Slightly elevated, but given the patient's age, I am hesitant to increase any of her other medications.

## 2016-10-13 NOTE — Assessment & Plan Note (Signed)
Patient denies bleeding. She is unsure she's taking Eliquis. Discussed that she should continue to take Eliquis 5 mg twice a day. We'll get a CBC today.

## 2016-10-13 NOTE — Progress Notes (Signed)
Subjective: CC: f/u rehab discharge  HPI: Patient is a 82 y.o. female with a past medical history of recent pulmonary embolism, mild dementia, gout, CAD, diastolic heart failure, hypertension, stroke, breast cancer in remission, colon cancer in remission, PND, and chronic pain in her back presenting to clinic today for a follow-up on rehabilitation discharge.  She was recently discharged from rehab. She now lives with [name], her daughter, again. She uses the the rolling walker to get around at home, but notes that she is in a wheelchair today. She is supposed to see PT and OT 2x/week, she states that she's only seen 1 person since her discharge. Her son Audry Pili by her to appointment today, but he is not with her. Unfortunately, Audry Pili is not informed about her medical care. The patient states that [name] was too busy cleaning her home to bring her to her appointment. The patient still has an aid that comes out 3x/week. She states she is eating well. She denies any shortness of breath, cough, or chest pain. She is unsure if she is taking Eliquis, as [name] does her medications. She is supposed to be on Eliquis 5 mg twice a day to be completed through April 2018 (6 month course). No bleeding. No easy bruising.   Right knee "pain" and back pain: Initially the patient states that she has had knee pain for ever. I note that she has never discussed keeping with me. She then states that the knee pain began after she last saw me. She notes that it occurs after ambulation, when I asked her to describe the pain, she states that it is "tired" and not really painful. She's had a total knee replacement on that side.  For her back pain, she notes that it is more over the right scapula and shoulder.  He states that the pain is most prominent with movement of the right shoulder above her head. No crepitus. She does endorse weakness secondary to pain. She states she is not taking anything for them pain. She was previous  he well-controlled on tramadol, but notes her daughter doesn't want her to take the tramadol due to constipation. She notes in the past, she's had issues with constipation and then diarrhea with treatment of the constipation. We've discussed this in the past, and she was transitioned to [facility] when taking pain medication. She states that this did very well and she did not have any bowel incontinence.  Social History: Continues to use snuff  Health Maintenance: Due for TD, Zostavax, and an annual eye exam.  ROS: All other systems reviewed and are negative.  Past Medical History Patient Active Problem List   Diagnosis Date Noted  . Hypokalemia 10/13/2016  . Pressure injury of skin 08/31/2016  . Pulmonary embolus ([place]) 08/30/2016  . Mild dementia 06/01/2016  . Depression 01/20/2016  . Suicidal ideation 01/19/2016  . Prolapse of female pelvic organs 12/28/2015  . Leg weakness, bilateral 12/08/2015  . Weakness of right lower extremity 11/25/2015  . (HFpEF) heart failure with preserved ejection fraction ([place])   . CAD in native artery   . Gout 05/14/2015  . Dyslipidemia   . Essential hypertension   . Seasonal allergies 02/10/2015  . Stroke-like symptoms   . Stroke-like episode ([place]) 01/25/2015  . Syncope and collapse 01/25/2015  . Anemia of chronic illness 08/19/2014  . Acute superficial venous thrombosis of right lower extremity 08/19/2014  . Hypercalcemia 06/09/2014  . Breast cancer, left breast ([place]) 06/09/2014  . Breast  cancer, right breast ([place]) 06/09/2014  . Edema of left lower extremity 03/30/2014  . PAD (peripheral artery disease) ([place]) 03/30/2014  . History of colon cancer 12/11/2013  . History of fall 05/15/2013  . Bilateral leg pain 03/07/2013  . Lower extremity edema 02/19/2013  . Stricture and stenosis of esophagus 10/27/2012  . Thoracic back pain 05/24/2012  . Tobacco abuse 06/02/2011  . History of breast cancer in female, bilateral.  Mastectomies 10/24/2010.  04/26/2011  . Hiatal hernia 03/22/2011  . Colon polyp 03/22/2011  . Poor circulation 03/22/2011  . 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 Neuropathy ([place]) 08/25/2009  . ANEMIA-IRON DEFICIENCY 04/29/2007  . HLD (hyperlipidemia) 01/29/2007  . Controlled type 2 diabetes mellitus without complication, without long-term current use of insulin ([place]) 01/10/2007  . HYPERTENSION, BENIGN SYSTEMIC 01/10/2007  . Coronary atherosclerosis 01/10/2007  . Chronic systolic heart failure ([place]) 01/10/2007  . Asthma 01/10/2007  . Reflux esophagitis 01/10/2007  . Osteoarthritis, multiple sites 01/10/2007    Medications- reviewed and updated  Objective: Office vital signs reviewed. BP (!) 158/66   Pulse 93   Temp 98.6 F (37 C) (Oral)   SpO2 100%    Physical Examination:  General: Awake, alert, well- nourished, NAD Cardio: RRR, II/VI systolic murmur. Pulm: No increased WOB.  CTAB, without wheezes, rhonchi or crackles noted.  Back: Normal to inspection. Tenderness over the right scapula and shoulder with deep palpation. Mild crepitus with shoulder abduction above 90. Negative empty can test. Negative [place]. Pulses and capillary refill intact distally. Sensation intact. Strength 4+ out of 5 in the upper extremities bilaterally.  Assessment/Plan: HYPERTENSION, BENIGN SYSTEMIC Slightly elevated, but given the patient's age, I am hesitant to increase any of her other medications.  Pulmonary embolus [facility]) Patient denies bleeding. She is unsure she's taking Eliquis. Discussed that she should continue to take Eliquis 5 mg twice a day. We'll get a CBC today.  Osteoarthritis, multiple sites Suspect the patient's shoulder pain is more secondary to osteoarthritis than any rotator cuff injury. No red flags on exam or history. Discussed scheduling Tylenol daily and then using tramadol as needed for breakthrough pain. Discussed that if she continues to take tramadol, she should take a daily Colace as  well to prevent constipation. The patient will continue to work with physical therapy.  Hypokalemia Noted to be hypokalemic on admission. This had resolved by the time of discharge. -We'll check a BMP today.   Orders Placed This Encounter  Procedures  . CBC  . BASIC METABOLIC PANEL WITH GFR    Meds ordered this encounter  Medications  . DISCONTD: traMADol (ULTRAM) 50 MG tablet    Sig: Take 1 tablet (50 mg total) by mouth every 8 (eight) hours as needed for severe pain.    Dispense:  75 tablet    Refill:  0  . docusate sodium (COLACE) 100 MG capsule    Sig: Take 1 capsule (100 mg total) by mouth 2 (two) times daily as needed for mild constipation.    Dispense:  30 capsule    Refill:  0  . traMADol (ULTRAM) 50 MG tablet    Sig: Take 1 tablet (50 mg total) by mouth every 8 (eight) hours as needed for severe pain.    Dispense:  75 tablet    Refill:  [place] PGY-3, [place]

## 2016-10-14 ENCOUNTER — Encounter: Payer: Self-pay | Admitting: Family Medicine

## 2016-10-15 IMAGING — CR DG CHEST 2V
2 series · 2 of 2 positions shown · non-contrast
Comparison: PA and lateral chest x-ray March 02, 2016

CLINICAL DATA: Sharp mid chest pain for the past day with intense
belching. History of CHF, breast and colon malignancy the lungs are
well-expanded. There is no focal infiltrate.

EXAM:
CHEST  2 VIEW

[w chest lat]
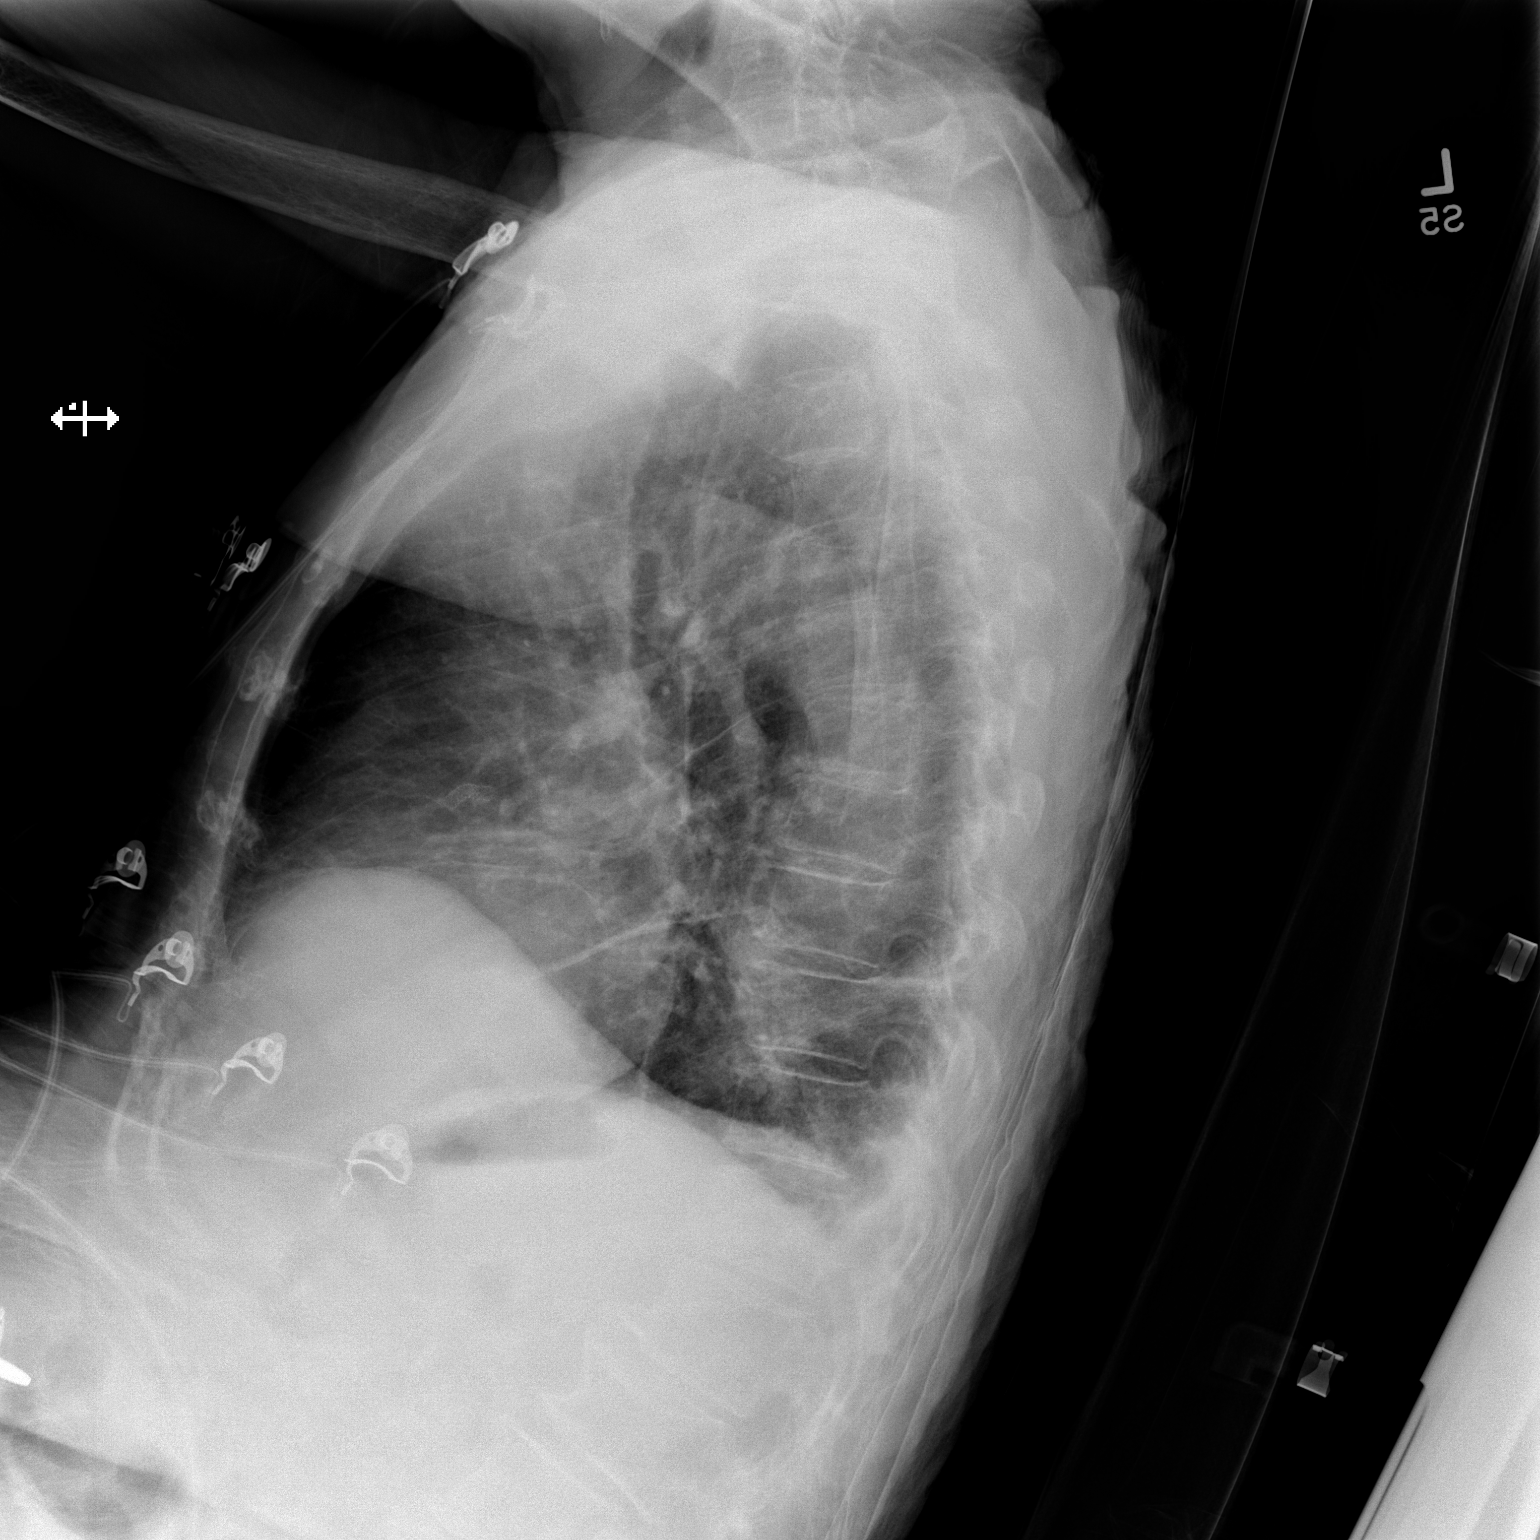

[x chest ap]
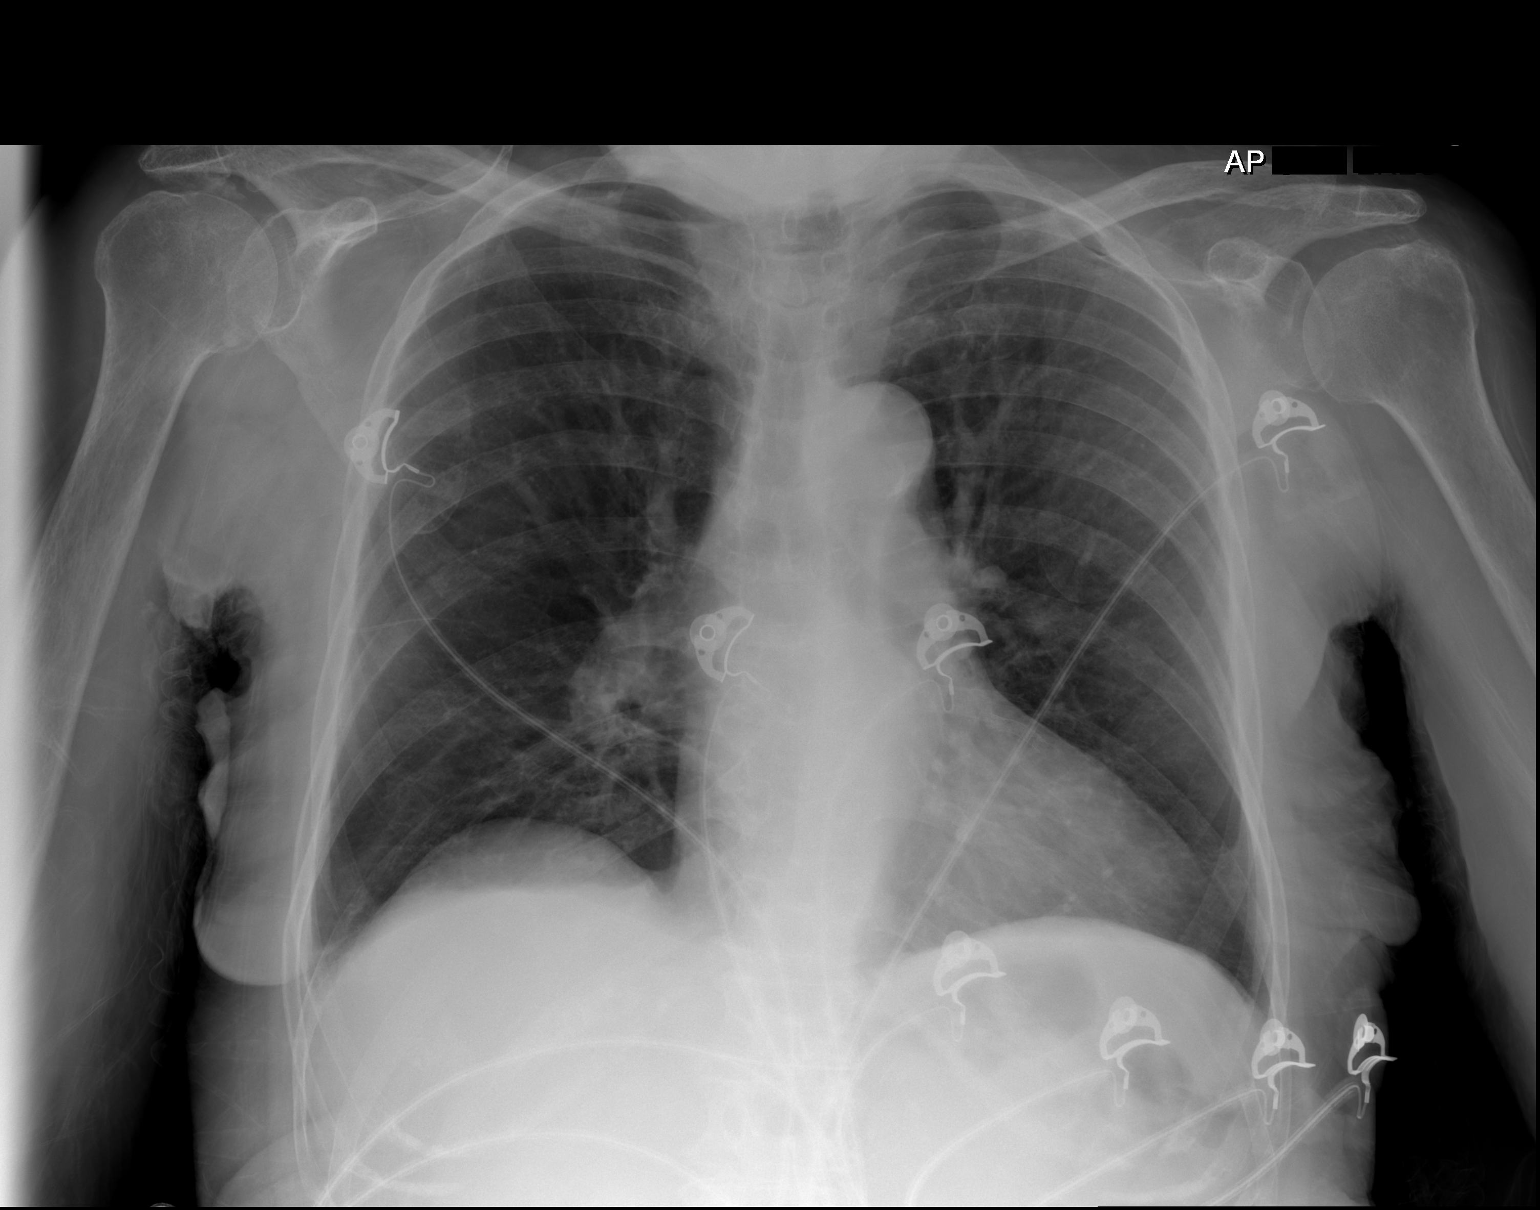

[2 of 2 positions shown; findings below may reference images not displayed]

FINDINGS: The lungs are well-expanded. There are increased lung markings at
the right base with an area of infiltrate in the right lower lobe
posteriorly. There is a small amount of pleural fluid blunting the
costophrenic angles on the right. The left lung is clear. The
cardiac silhouette is top-normal in size. The central pulmonary
vascularity is mildly prominent but not greatly changed from the
previous study. There is calcification in the wall of the aortic
arch. There are degenerative changes at multiple thoracic disc
levels. There is loss of height of the body of approximately L2
which is stable.
IMPRESSION: Right lower lobe pneumonia with some associated atelectasis and
small pleural effusion. Followup PA and lateral chest X-ray is
recommended in 3-4 weeks following trial of antibiotic therapy to
ensure resolution and exclude underlying malignancy.

Top-normal cardiac size with chronic central pulmonary vascular
congestion.

Aortic atherosclerosis.

## 2016-10-15 IMAGING — CR DG FOOT COMPLETE 3+V*R*
3 series · 3 of 3 positions shown · non-contrast
Comparison: None.

CLINICAL DATA: General right foot pain for 2 days.

EXAM:
RIGHT FOOT COMPLETE - 3+ VIEW

[x foot ap right]
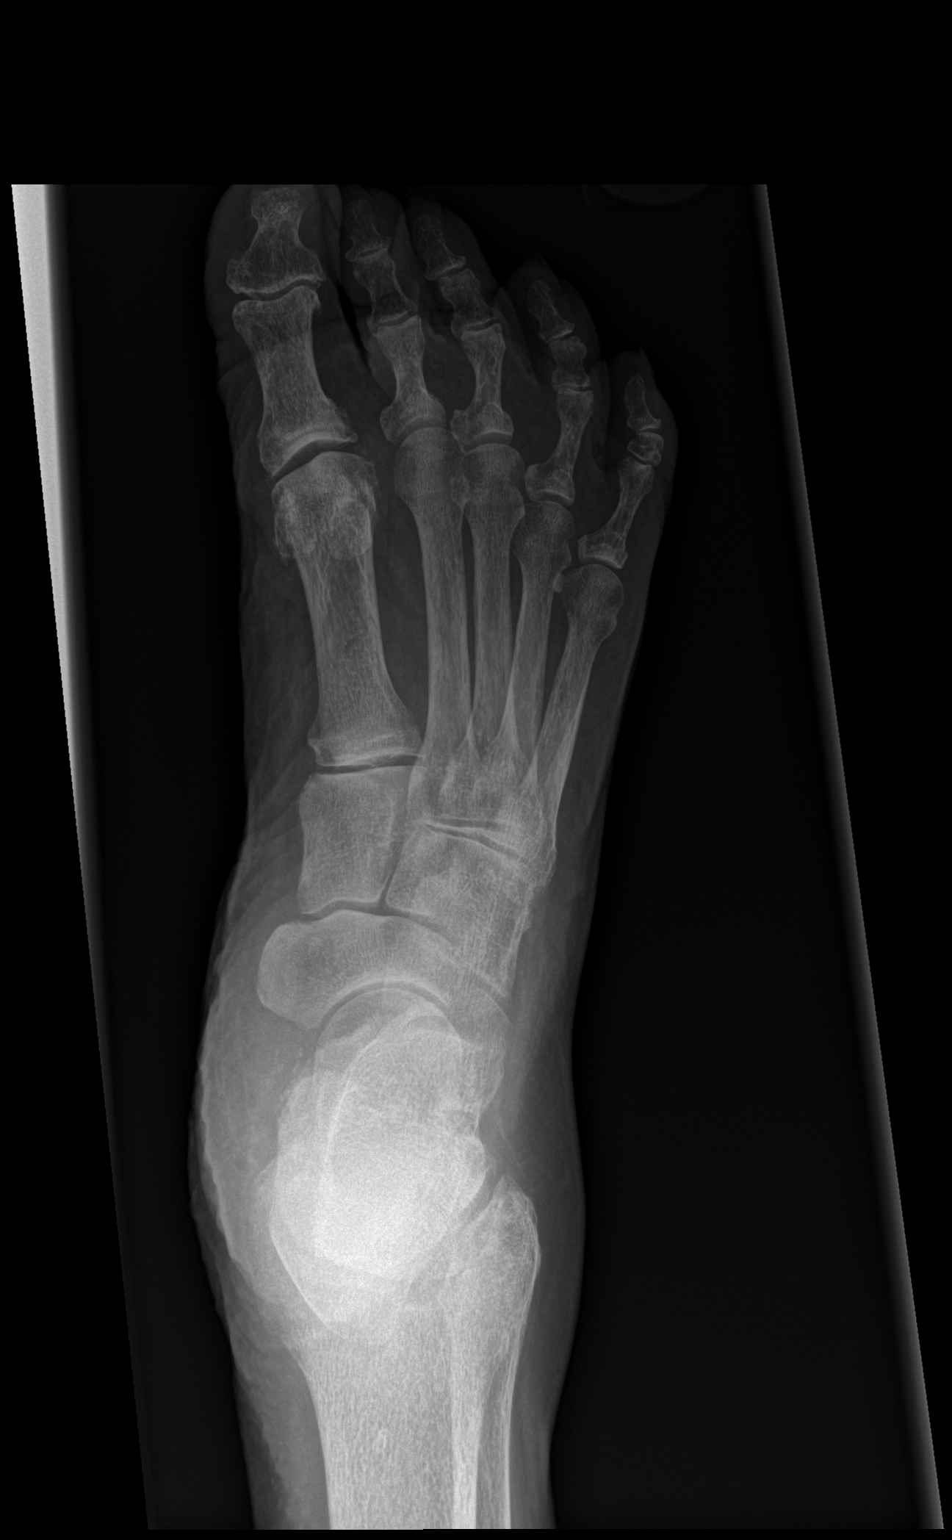

[x foot obl right]
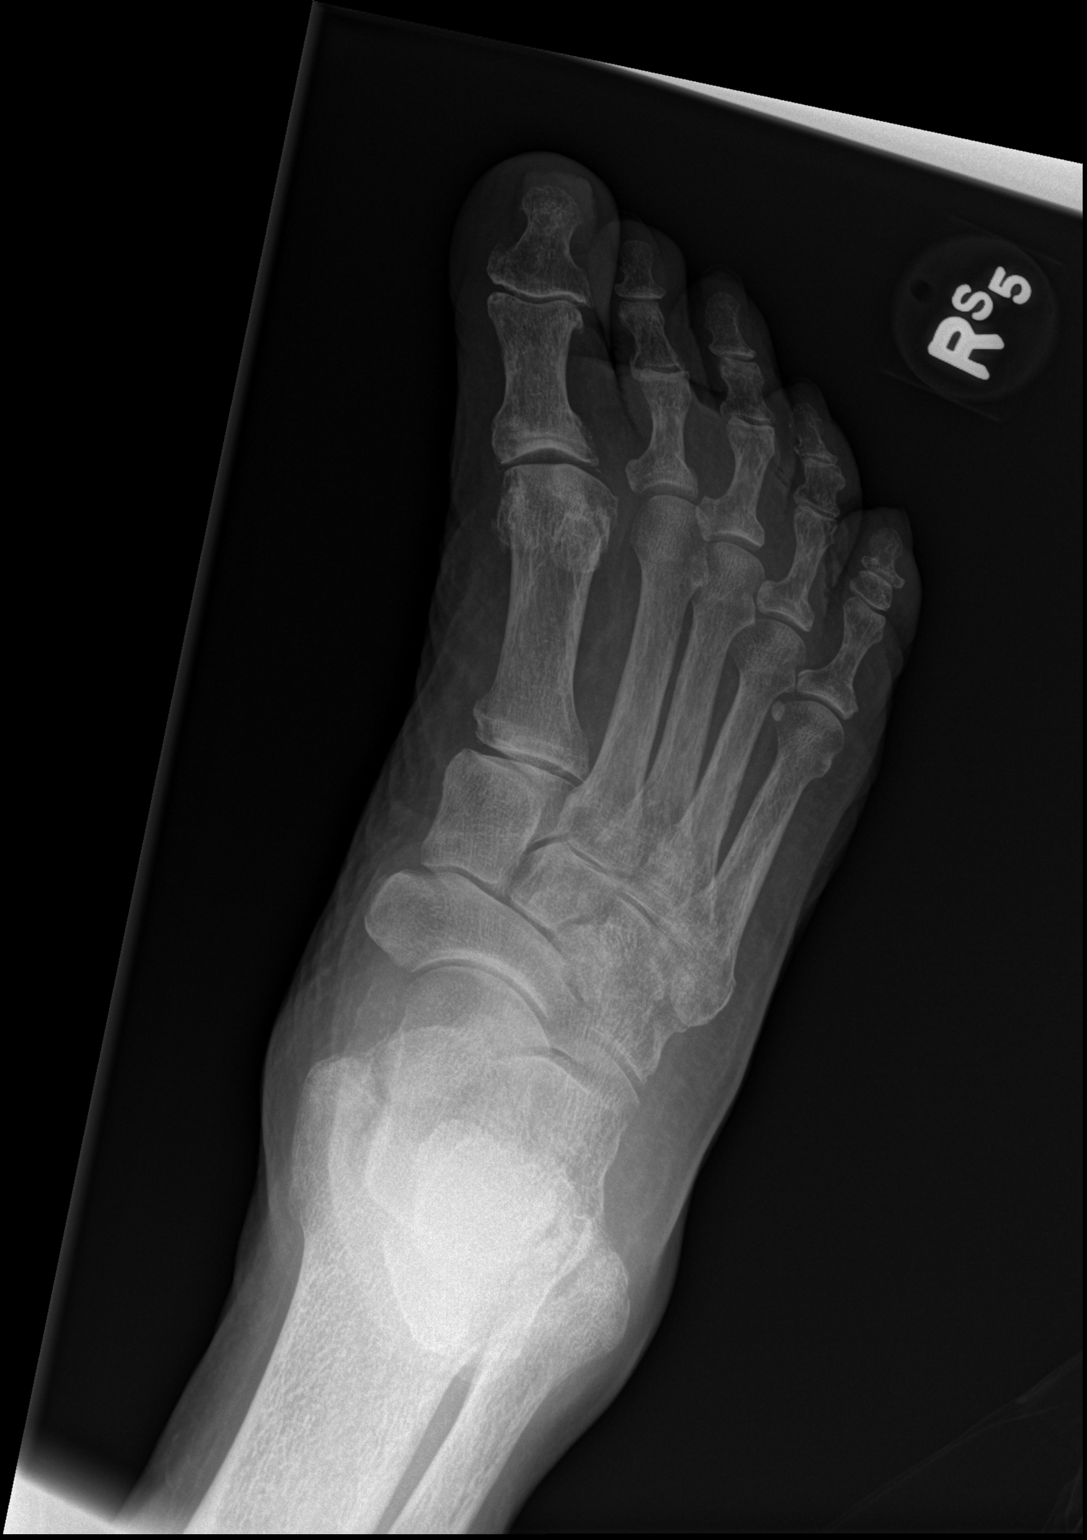

[x foot lat right]
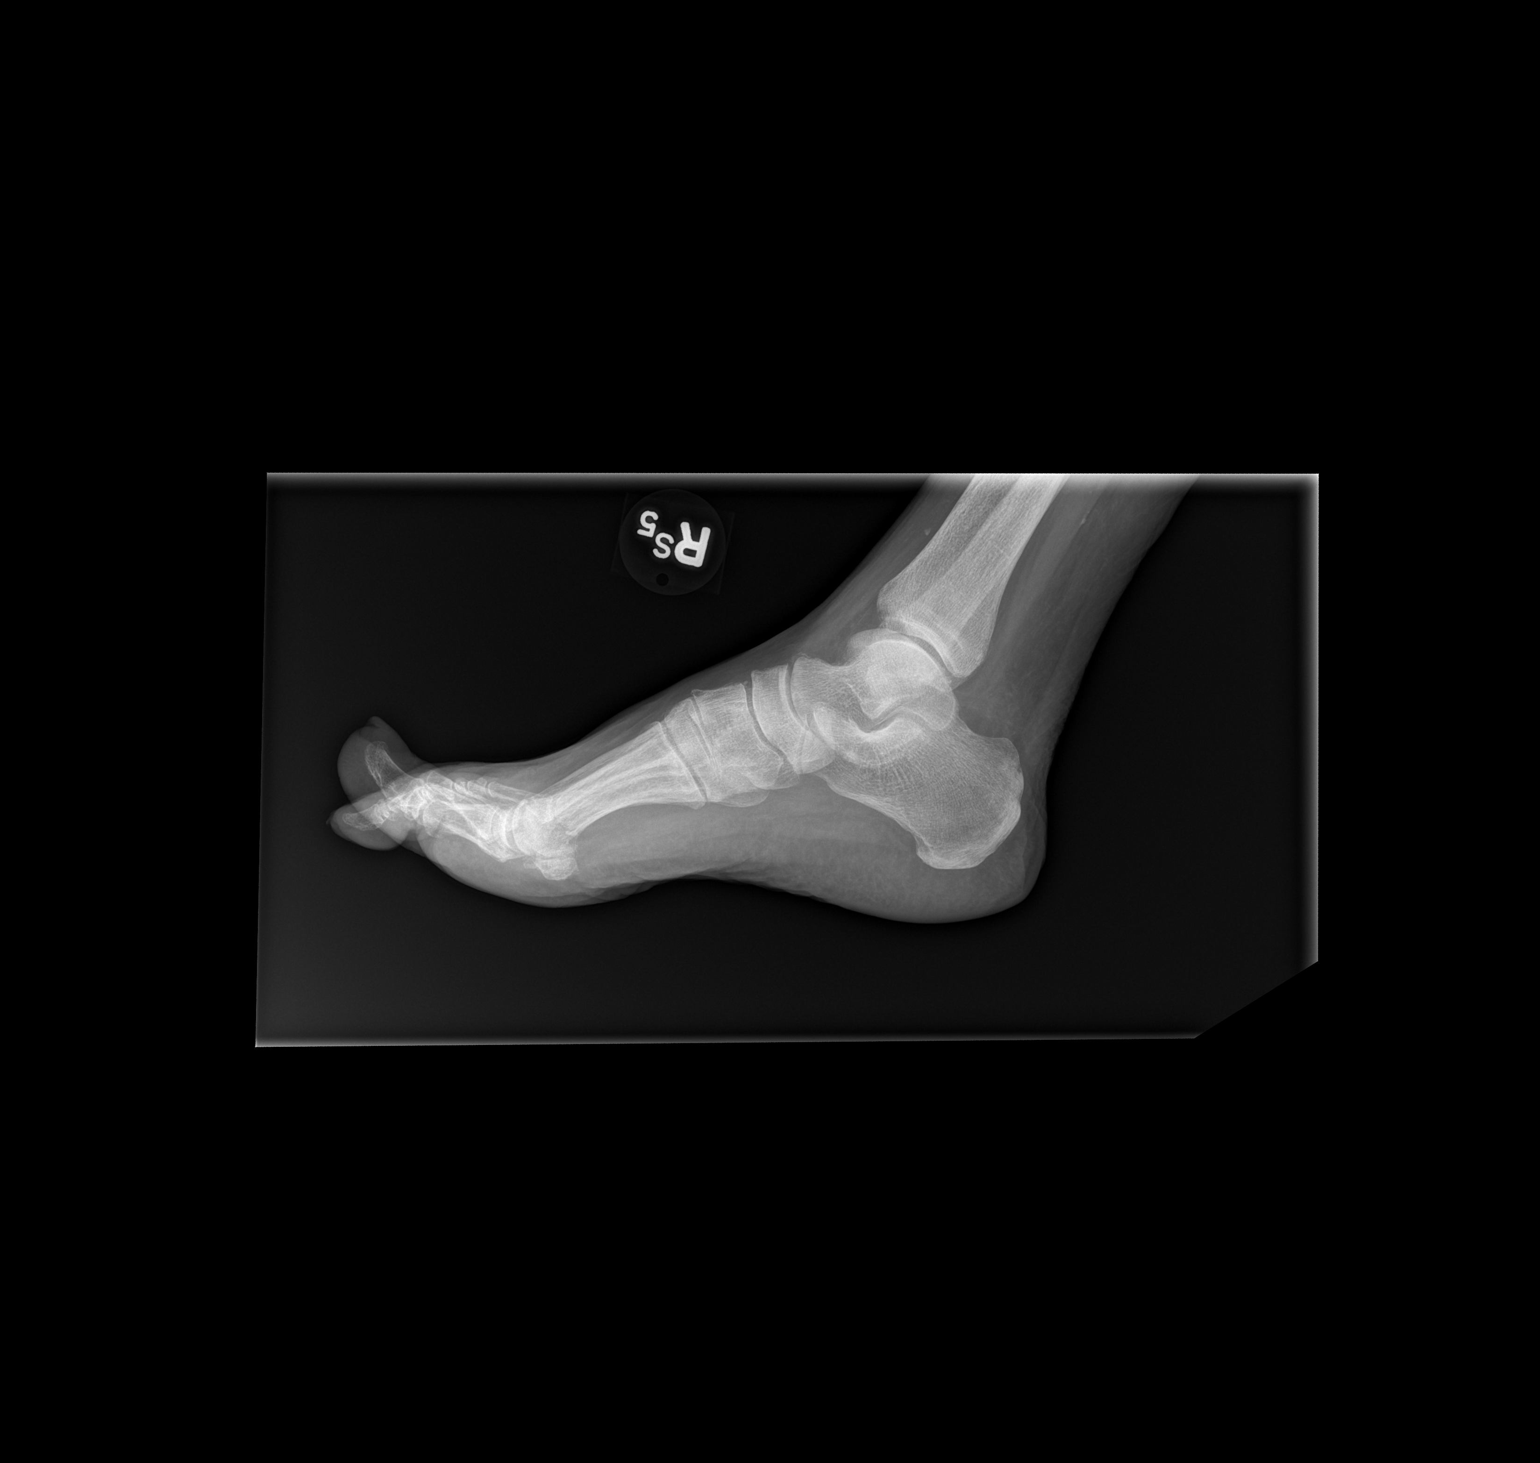

[3 of 3 positions shown; findings below may reference images not displayed]

FINDINGS: No fracture. No subluxation or dislocation. No worrisome lytic or
sclerotic osseous abnormality. Degenerative changes are noted at the
MTP joint of the great toe.
IMPRESSION: No acute bony findings.

## 2016-10-16 ENCOUNTER — Encounter (HOSPITAL_COMMUNITY): Payer: Medicare Other

## 2016-10-16 ENCOUNTER — Ambulatory Visit: Payer: Medicare Other | Admitting: Family

## 2016-10-16 DIAGNOSIS — L89620 Pressure ulcer of left heel, unstageable: Secondary | ICD-10-CM | POA: Diagnosis not present

## 2016-10-16 DIAGNOSIS — E1140 Type 2 diabetes mellitus with diabetic neuropathy, unspecified: Secondary | ICD-10-CM | POA: Diagnosis not present

## 2016-10-16 DIAGNOSIS — L89610 Pressure ulcer of right heel, unstageable: Secondary | ICD-10-CM | POA: Diagnosis not present

## 2016-10-16 DIAGNOSIS — J189 Pneumonia, unspecified organism: Secondary | ICD-10-CM | POA: Diagnosis not present

## 2016-10-16 DIAGNOSIS — I110 Hypertensive heart disease with heart failure: Secondary | ICD-10-CM | POA: Diagnosis not present

## 2016-10-16 DIAGNOSIS — I5020 Unspecified systolic (congestive) heart failure: Secondary | ICD-10-CM | POA: Diagnosis not present

## 2016-10-16 NOTE — Patient Outreach (Signed)
[place] [facility]) Care Management [place] Telephone Outreach, Transition of Care Attempt # 2   Late entry for 10/12/2016  [name] 14-Jul-1924 [id number]  Second attempt  to reach patient without success. Phone rang multiple times with no answer and no option to leave a voicemail.  [place] R. Lorie Melichar, RN, BSN, [place] Management Coordinator [phone]

## 2016-10-21 IMAGING — CT CT ANGIO CHEST
2 of 6 series · 18 of 36 positions shown · IV contrast (ISOVUE 370)
Comparison: 01/21/2013 chest CT and same day chest radiograph

CLINICAL DATA: Low oxygen saturation. Recently diagnosed with
pneumonia.

EXAM:
CT ANGIOGRAPHY CHEST WITH CONTRAST
TECHNIQUE: Multidetector CT imaging of the chest was performed using the
standard protocol during bolus administration of intravenous
contrast. Multiplanar CT image reconstructions and MIPs were
obtained to evaluate the vascular anatomy.
CONTRAST:  75 mL Isovue 370 IV

[Series 5: coronal mpr · coronal · 0.49mm/px · 1 of 102 slices shown]
[im 51/102  mediastinal]
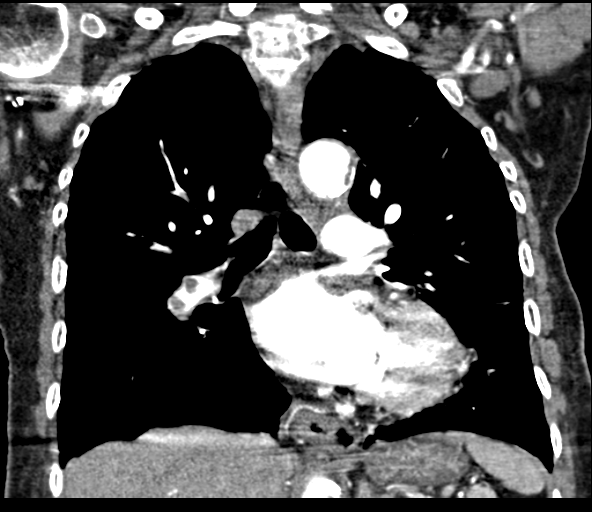

[Series 10: thins for pacs · axial · 0.67mm/px · z∈[+1190,+1408]mm · 17 of 244 slices shown]
[im 13/244  lung]
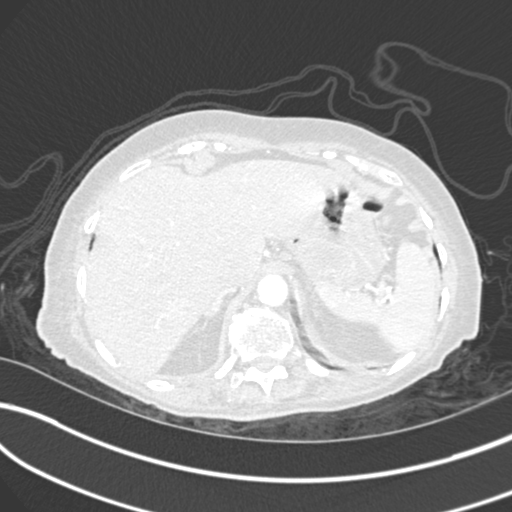
[im 25/244  mediastinal]
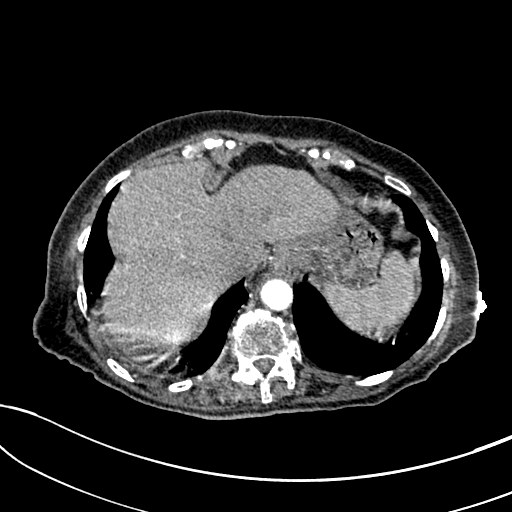
[im 37/244  lung]
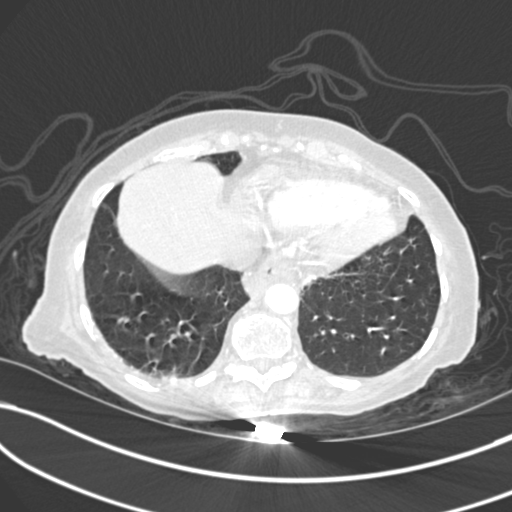
[im 49/244  mediastinal]
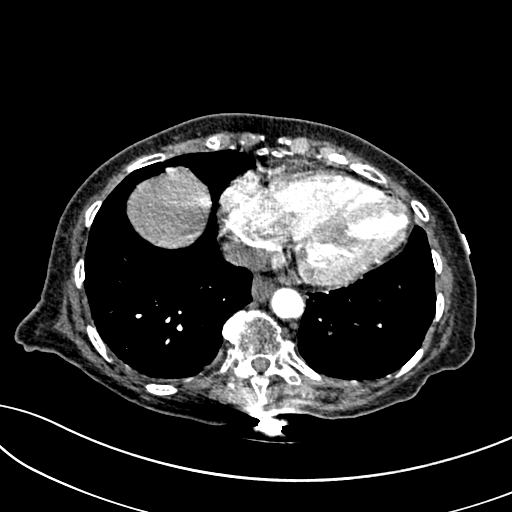
[im 73/244  lung]
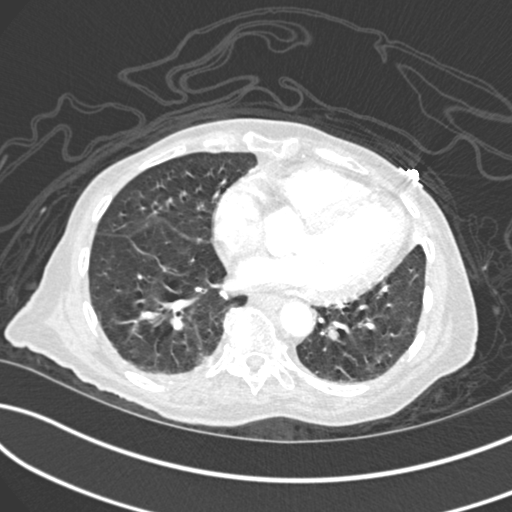
[im 86/244  mediastinal]
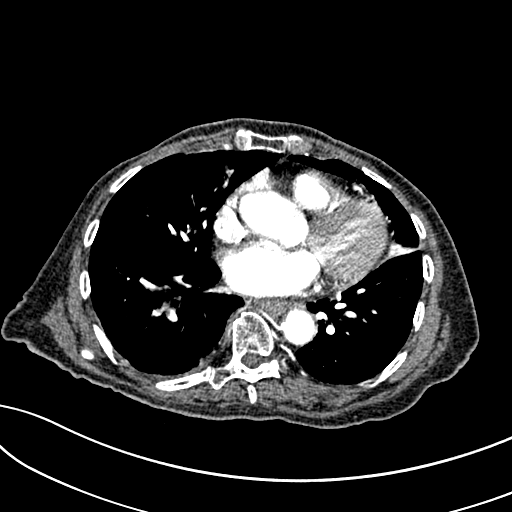
[im 98/244  lung]
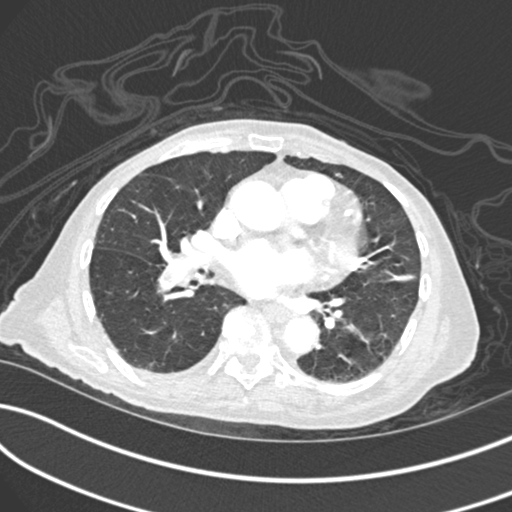
[im 110/244  mediastinal]
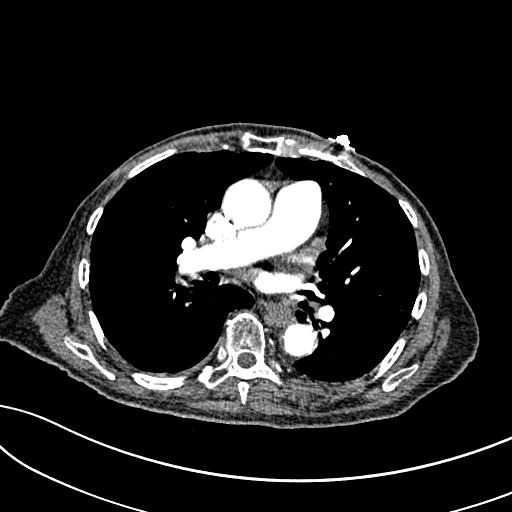
[im 122/244  lung]
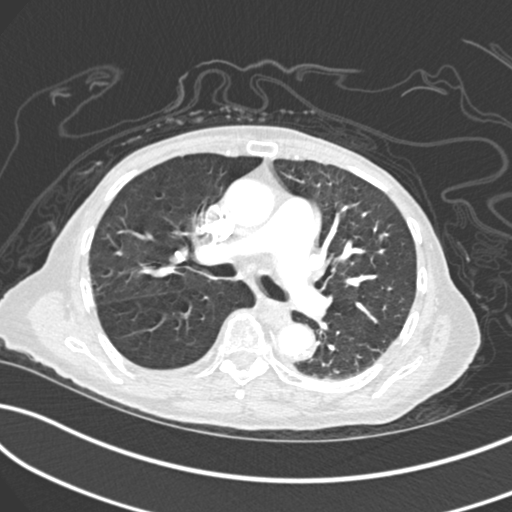
[im 134/244  mediastinal]
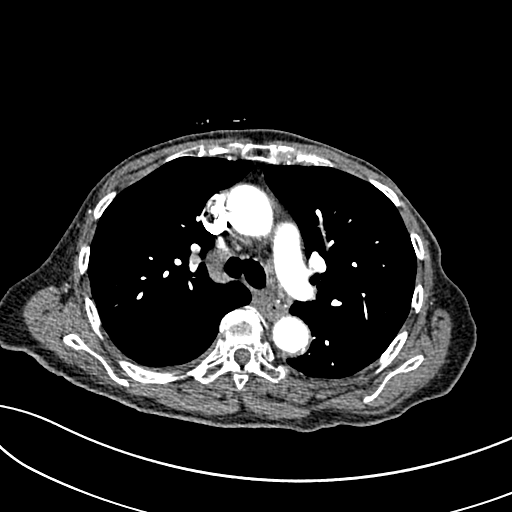
[im 146/244  lung]
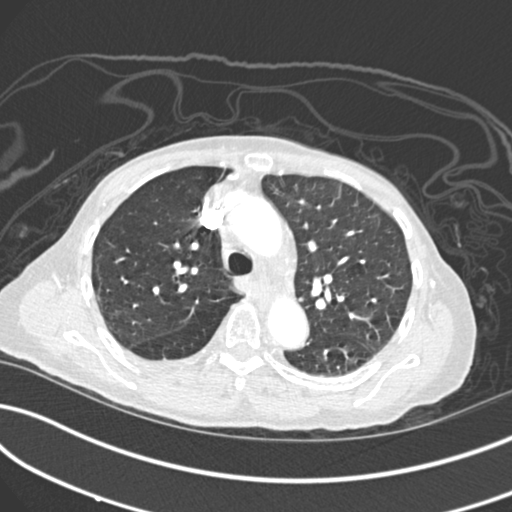
[im 158/244  mediastinal]
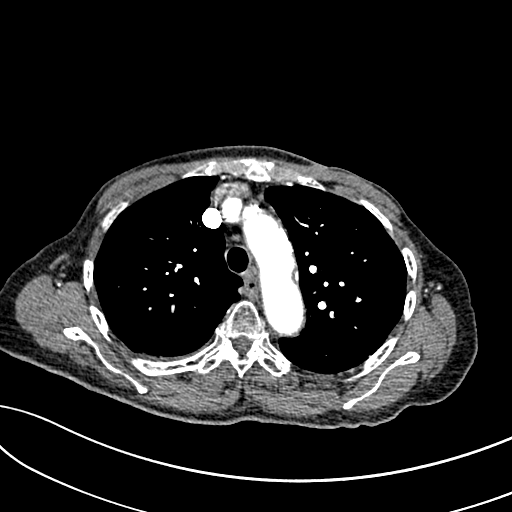
[im 171/244  lung]
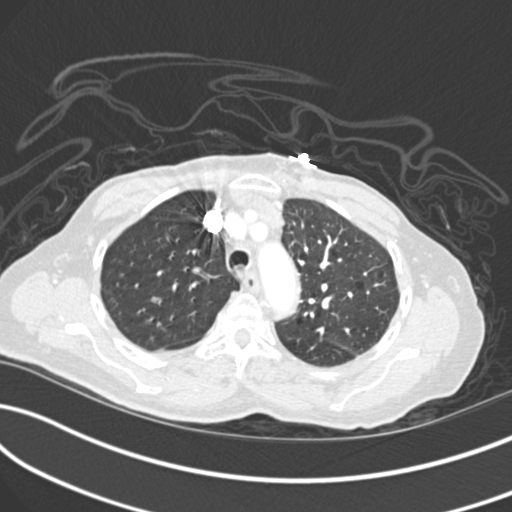
[im 195/244  mediastinal]
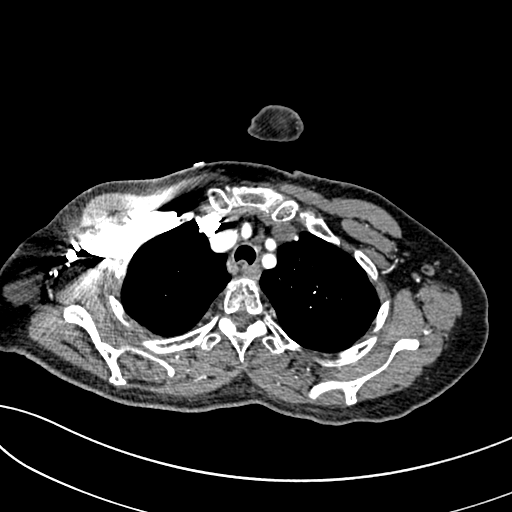
[im 207/244  lung]
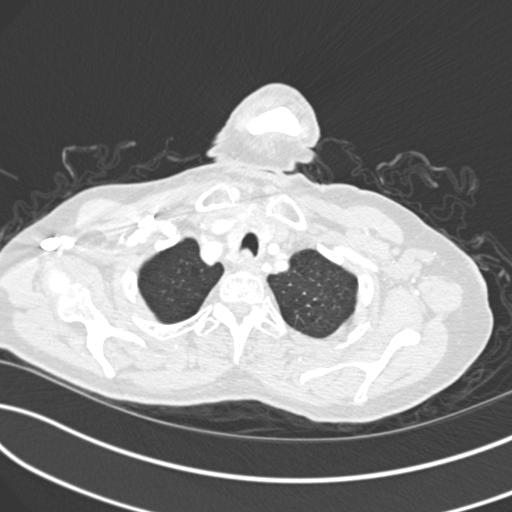
[im 219/244  mediastinal]
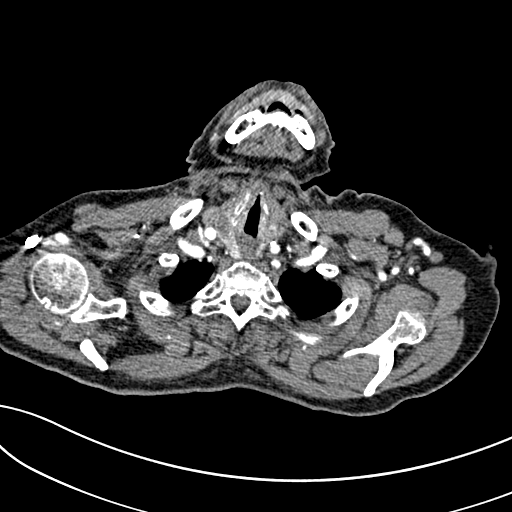
[im 231/244  lung]
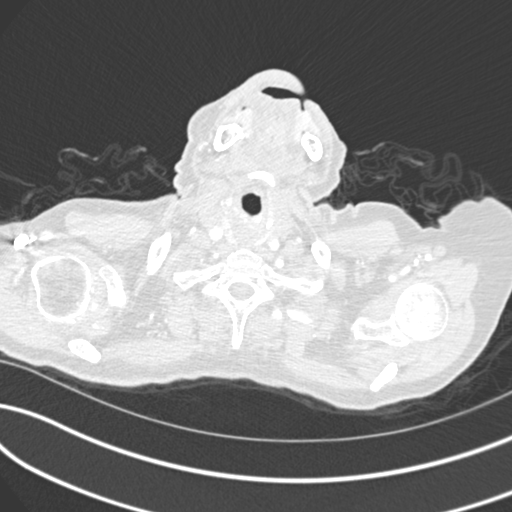

[18 of 36 positions shown; findings below may reference images not displayed]

FINDINGS: Cardiovascular:

Acute nonocclusive pulmonary emboli are noted on the right, chest
distal to the right main pulmonary artery extending into the right
lower lobe lobar, segmental and subsegmental pulmonary arteries.
Small subsegmental Nu occlusive left lower lobe pulmonary embolus
is also identified. The RV to LV ratio is approximately 0.84. The
heart is top normal. Coronary arteriosclerosis. No pericardial
effusion. Aortic atherosclerosis. No aneurysm.

Mediastinum/Nodes: 1 cm short axis right lower paratracheal lymph
node. Similar sized aorticopulmonary short axis lymph nodes. Small
right hilar lymph nodes. Small hiatal hernia with mild-to-moderate
diffuse thickening of the esophagus possibly inflammatory.

Lungs/Pleura: No pneumonic consolidations. Lingular, right middle
lobe and left upper lobe atelectasis.

Upper Abdomen: Mild thickening of the left adrenal apex. No acute
abnormality.

Musculoskeletal: Degenerative disc disease along the visualized
dorsal spine. No acute osseous abnormality.

Review of the MIP images confirms the above findings.
IMPRESSION: Bilateral lower lobe pulmonary emboli, nonocclusive involving right
lobar through subsegmental pulmonary arteries to the right lower
lobe and occlusive subsegmental left lower lobe pulmonary arteries.
The RV to LV ratio slightly under the cutoff for submassive PE.

Critical Value/emergent results were called by telephone at the time
of interpretation on 08/30/2016 at [DATE] to Dr. DEB MCEACHERN , who
verbally acknowledged these results.

## 2016-10-21 IMAGING — CR DG CHEST 2V
2 series · 2 of 2 positions shown · non-contrast
Comparison: 08/24/2016

CLINICAL DATA: Altered mental status starting this morning

EXAM:
CHEST  2 VIEW

[w chest lat]
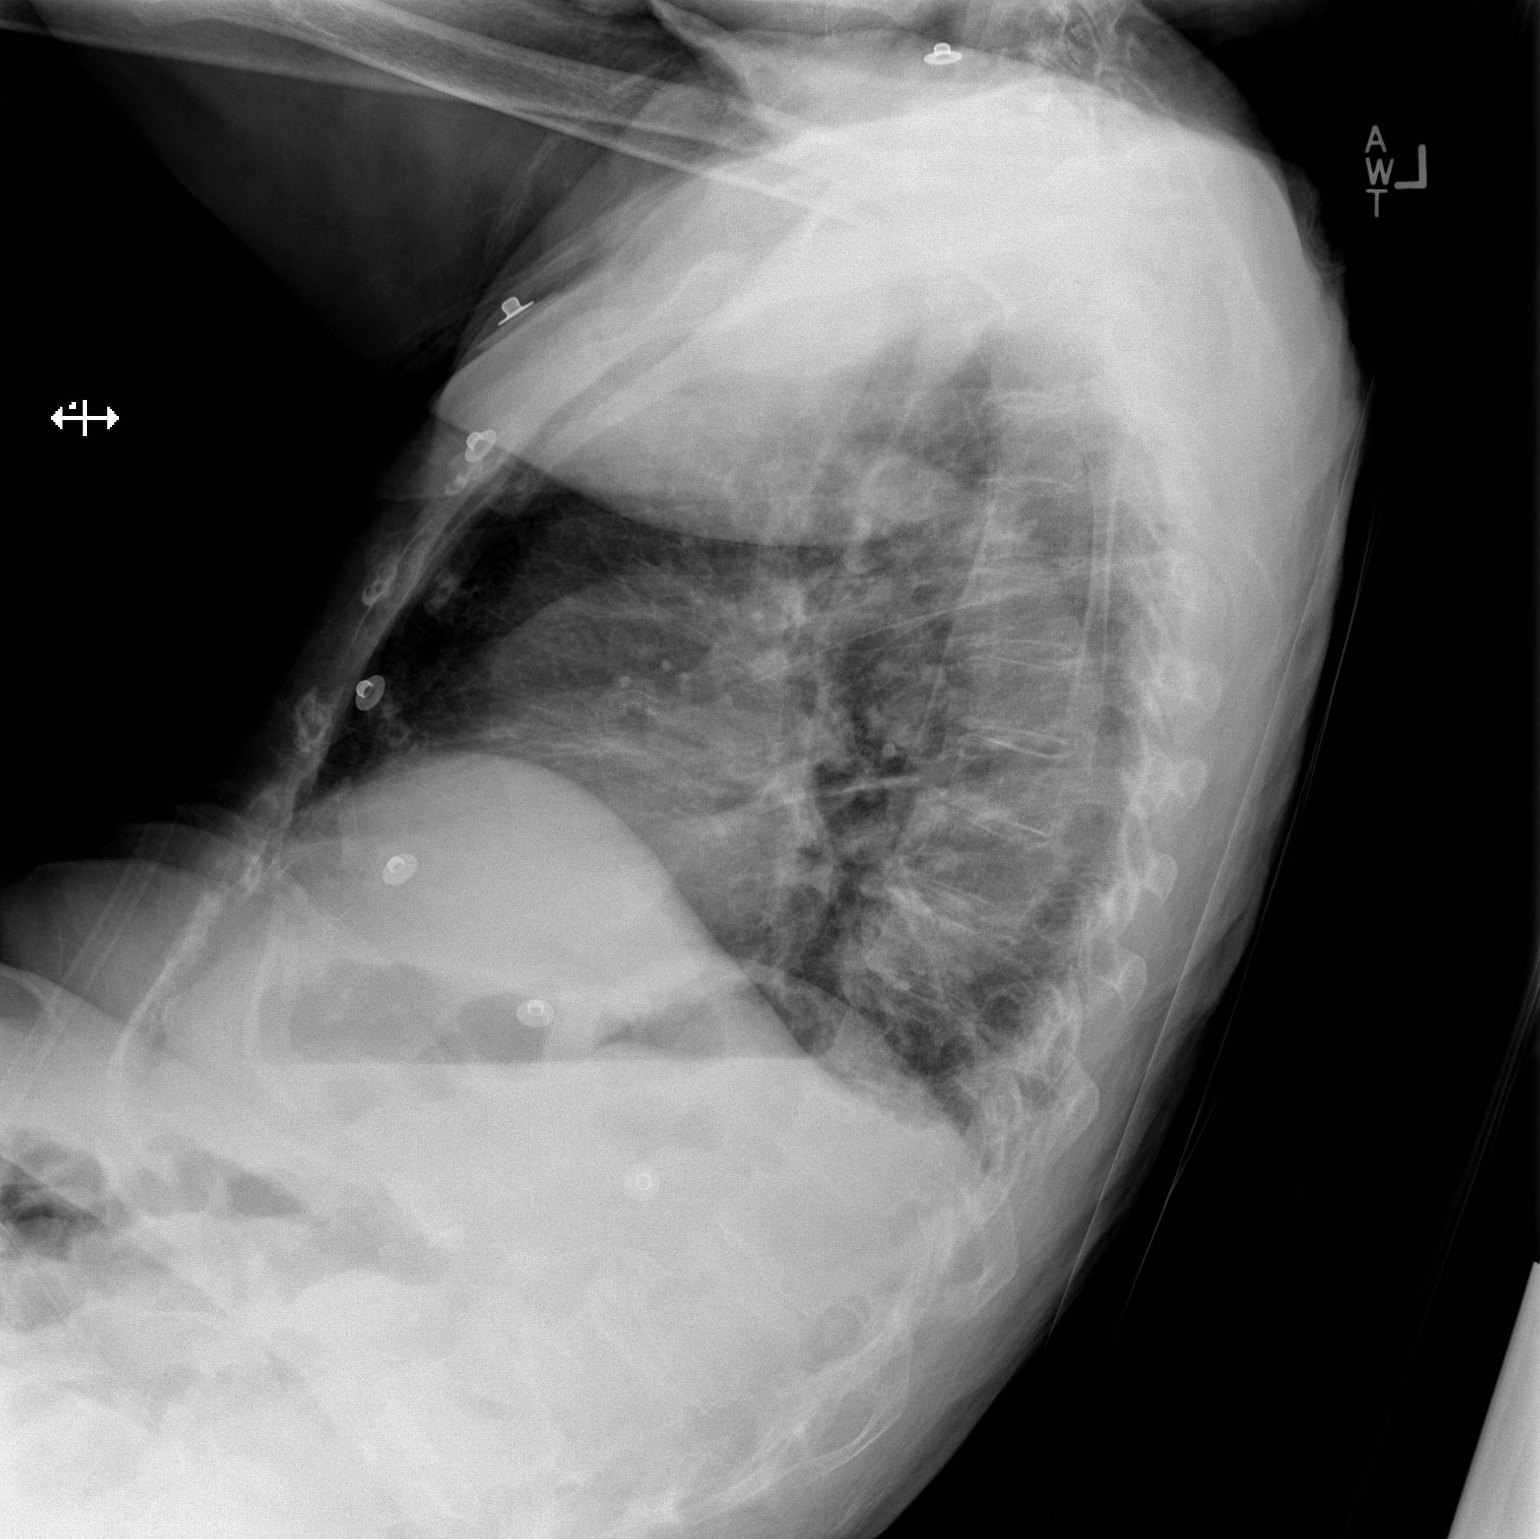

[x chest ap]
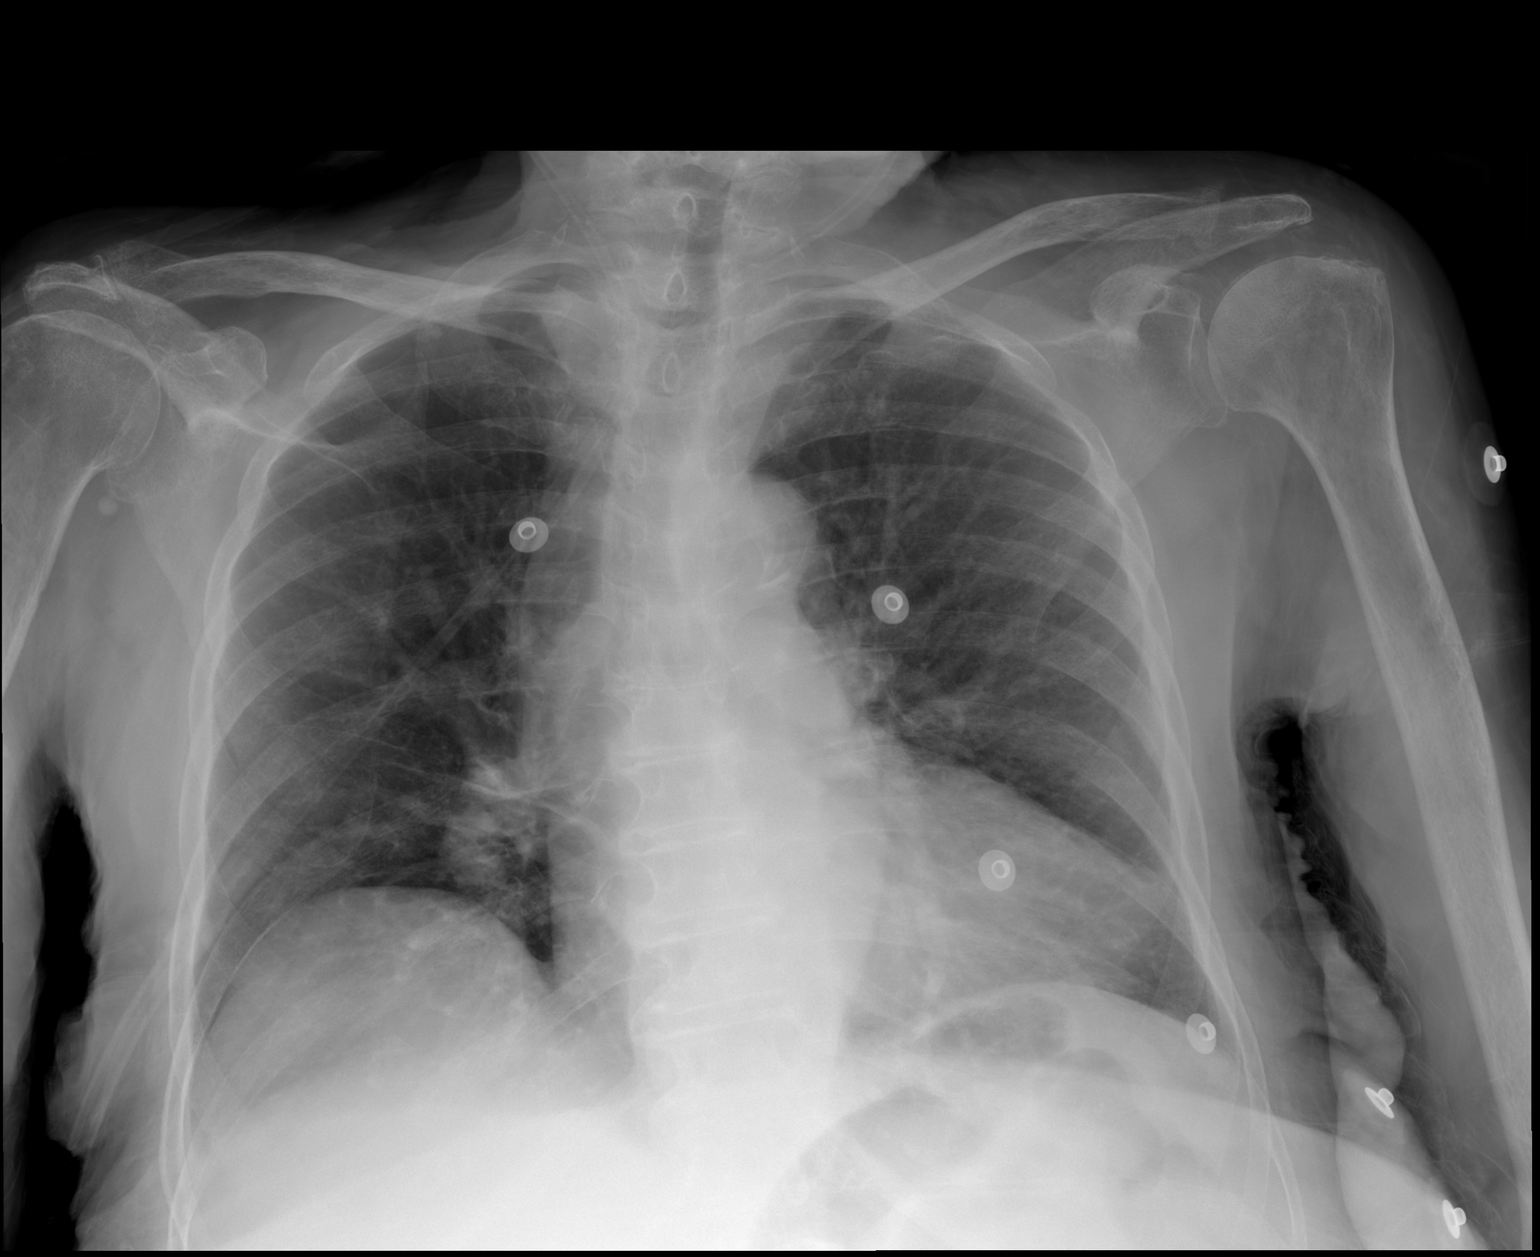

[2 of 2 positions shown; findings below may reference images not displayed]

FINDINGS: Cardiomegaly is noted. Elevation of the right hemidiaphragm. No
acute infiltrate or pleural effusion. No pulmonary edema.
Degenerative changes thoracic spine.
IMPRESSION: No active cardiopulmonary disease.

## 2016-10-23 DIAGNOSIS — L89620 Pressure ulcer of left heel, unstageable: Secondary | ICD-10-CM | POA: Diagnosis not present

## 2016-10-23 DIAGNOSIS — J189 Pneumonia, unspecified organism: Secondary | ICD-10-CM | POA: Diagnosis not present

## 2016-10-23 DIAGNOSIS — L89610 Pressure ulcer of right heel, unstageable: Secondary | ICD-10-CM | POA: Diagnosis not present

## 2016-10-23 DIAGNOSIS — I5020 Unspecified systolic (congestive) heart failure: Secondary | ICD-10-CM | POA: Diagnosis not present

## 2016-10-23 DIAGNOSIS — E1140 Type 2 diabetes mellitus with diabetic neuropathy, unspecified: Secondary | ICD-10-CM | POA: Diagnosis not present

## 2016-10-23 DIAGNOSIS — I110 Hypertensive heart disease with heart failure: Secondary | ICD-10-CM | POA: Diagnosis not present

## 2016-10-24 ENCOUNTER — Ambulatory Visit (INDEPENDENT_AMBULATORY_CARE_PROVIDER_SITE_OTHER): Payer: Medicare Other | Admitting: Podiatry

## 2016-10-24 ENCOUNTER — Encounter: Payer: Self-pay | Admitting: Podiatry

## 2016-10-24 VITALS — BP 167/81 | HR 83 | Resp 14

## 2016-10-24 DIAGNOSIS — M79676 Pain in unspecified toe(s): Secondary | ICD-10-CM

## 2016-10-24 DIAGNOSIS — B351 Tinea unguium: Secondary | ICD-10-CM

## 2016-10-24 NOTE — Progress Notes (Signed)
Patient ID: [name], female   DOB: 20-Aug-1924, 82 y.o.   MRN: [id number]    Subjective: This patient presents again complaining of thickened and elongated toenails walking wearing shoes and requests toenail debridement. Patient's daughter is present in treatment room  Objective: Orientated 3 DP and PT pulses trace palpable bilaterally Capillary reflex delayed bilaterally Sensation to 10 g monofilament wire intact 5/5 bilaterally Vibratory sensation nonreactive bilaterally Ankle reflex reactive bilaterally Dorsi flexion right 5/5 right and 4/5 left Plantar flexion 5/5 bilaterally Patient walks slowly with walker Atrophic skin bilaterally No open skin lesions bilaterally The toenails are hypertrophic, elongated, deformed, discolored and tender direct palpation 6-10  Assessment: Symptomatic onychomycoses 6-10 Type II diabetic with peripheral arterial disease and peripheral neuropathy Scaling lateral right heel  Plan: Debridement toenails 6-10 mechanically and electronically without any bleeding  Reappoint 3 months

## 2016-10-24 NOTE — Patient Instructions (Signed)

## 2016-10-25 DIAGNOSIS — E1140 Type 2 diabetes mellitus with diabetic neuropathy, unspecified: Secondary | ICD-10-CM | POA: Diagnosis not present

## 2016-10-25 DIAGNOSIS — L89610 Pressure ulcer of right heel, unstageable: Secondary | ICD-10-CM | POA: Diagnosis not present

## 2016-10-25 DIAGNOSIS — J189 Pneumonia, unspecified organism: Secondary | ICD-10-CM | POA: Diagnosis not present

## 2016-10-25 DIAGNOSIS — I110 Hypertensive heart disease with heart failure: Secondary | ICD-10-CM | POA: Diagnosis not present

## 2016-10-25 DIAGNOSIS — L89620 Pressure ulcer of left heel, unstageable: Secondary | ICD-10-CM | POA: Diagnosis not present

## 2016-10-25 DIAGNOSIS — I5020 Unspecified systolic (congestive) heart failure: Secondary | ICD-10-CM | POA: Diagnosis not present

## 2016-11-07 ENCOUNTER — Other Ambulatory Visit: Payer: Self-pay | Admitting: *Deleted

## 2016-11-07 NOTE — Patient Outreach (Signed)
[facility]) Care Management  11/07/2016  [name] 05-Apr-1924 [id number]   Assumed care of member from previous care manager, V. Satterfield.  Per notes, member was recently admitted to hospital from 10/18-10/23, discharged to SNF.  Upon discharge from SNF, Mrs. Satterfield attempted to contact member for transition of care twice without success.  Call placed to member today to follow up on involvement with [facility], no answer.  Unable to leave a voice message.  Will await call back, if no call back will make another attempt within the next 2 days.  If no answer at [facility] listed preferred number, will reach out to daughter, [name].    [name], [place], MSN [place] [phone]

## 2016-11-09 ENCOUNTER — Other Ambulatory Visit: Payer: Self-pay | Admitting: *Deleted

## 2016-11-09 NOTE — Patient Outreach (Signed)
[facility]) Care Management  11/09/2016  [name] 04/13/1924 [id number]   2nd attempt made to contact member regarding involvement with [facility].  No answer at [facility] listed preferred number [phone]), unable to leave a voice message.  Call then placed to [name]'s listed home number [phone]) but that number has been disconnected.  Call placed to daughter's listed cell number [phone]), no answer.  HIPAA compliant voice message left.  Will await call back, if no call back will make 3rd attempt to reach tomorrow.  [name], [place], MSN [place] [phone]

## 2016-11-10 ENCOUNTER — Other Ambulatory Visit: Payer: Self-pay | Admitting: *Deleted

## 2016-11-10 ENCOUNTER — Encounter: Payer: Self-pay | Admitting: *Deleted

## 2016-11-10 NOTE — Patient Outreach (Signed)
[facility]) Care Management  11/10/2016  [name] November 20, 1923 [id number]   3rd attempt made to contact member for [facility] services, unsuccessful.  Unable to leave voice message.  Will send outreach letter, if no response in 10 days, will close case.  [name], [place], MSN [place] [phone]

## 2016-11-20 ENCOUNTER — Other Ambulatory Visit: Payer: Self-pay | Admitting: *Deleted

## 2016-11-21 MED ORDER — AMLODIPINE BESYLATE 5 MG PO TABS: 5.0000 mg | ORAL_TABLET | Freq: Every day | ORAL | 6 refills | Status: DC

## 2016-11-21 MED ORDER — RANITIDINE HCL 150 MG PO CAPS: ORAL_CAPSULE | ORAL | 2 refills | Status: DC

## 2016-11-21 MED ORDER — FERROUS SULFATE 325 (65 FE) MG PO TABS: 325.0000 mg | ORAL_TABLET | Freq: Every day | ORAL | 2 refills | Status: DC

## 2016-11-21 MED ORDER — LOSARTAN POTASSIUM 50 MG PO TABS: ORAL_TABLET | ORAL | 6 refills | Status: DC

## 2016-11-21 NOTE — Telephone Encounter (Signed)
Daughter is calling to check the status of her mother's medications. She has been out of some of these for about 2 days now. Can we get these called in today, jw

## 2016-11-21 NOTE — Telephone Encounter (Signed)
3rd request. Pt has been without meds for 3 days. ep

## 2016-11-21 NOTE — Telephone Encounter (Signed)
Second pharmacy request received in addition to patient's daughter's call.  Hubbard Hartshorn, RN, BSN

## 2016-11-21 NOTE — Telephone Encounter (Signed)
Medications refilled on 11/21/16. The patient's initial request was less than 12 hours. Please advise them in the future they should allow at least 48 business hours for medication refills.  Thanks, [name], MD [facility] Family Medicine Resident  11/21/2016, 6:10 PM

## 2016-11-24 ENCOUNTER — Encounter: Payer: Self-pay | Admitting: *Deleted

## 2016-11-24 ENCOUNTER — Other Ambulatory Visit: Payer: Self-pay | Admitting: *Deleted

## 2016-11-24 NOTE — Patient Outreach (Signed)
[facility]) Care Management  11/24/2016  [name] 1924/10/14 [id number]   Outreach letter sent to member on 12/29, no response.  Will close case. Will notify care management assistant and primary MD of case closure.  [name], [place], MSN [place] [phone]

## 2016-11-27 ENCOUNTER — Other Ambulatory Visit: Payer: Self-pay | Admitting: Family Medicine

## 2016-11-27 DIAGNOSIS — M546 Pain in thoracic spine: Secondary | ICD-10-CM

## 2016-11-27 NOTE — Telephone Encounter (Signed)
Daughter is calling for her mother who needs a refill on her Tramadol called in. jw

## 2016-11-28 MED ORDER — TRAMADOL HCL 50 MG PO TABS: 50.0000 mg | ORAL_TABLET | Freq: Three times a day (TID) | ORAL | 0 refills | Status: DC | PRN

## 2016-11-28 NOTE — Telephone Encounter (Signed)
Pt daughter informed. Deseree Blount, CMA  

## 2016-11-28 NOTE — Telephone Encounter (Signed)
Please let patient know that Rx was called in to pharmacy. [name] will need to be seen if she requires any additional refills on tramadol.   Thanks, [name], MD [facility] Family Medicine Resident  11/28/2016, 8:19 AM

## 2016-12-11 ENCOUNTER — Ambulatory Visit: Payer: Self-pay | Admitting: Family Medicine

## 2016-12-14 ENCOUNTER — Ambulatory Visit (INDEPENDENT_AMBULATORY_CARE_PROVIDER_SITE_OTHER): Payer: Medicare Other | Admitting: Family Medicine

## 2016-12-14 ENCOUNTER — Encounter: Payer: Self-pay | Admitting: Family Medicine

## 2016-12-14 VITALS — BP 110/78 | HR 90 | Temp 98.1°F | Wt 115.0 lb

## 2016-12-14 DIAGNOSIS — R42 Dizziness and giddiness: Secondary | ICD-10-CM | POA: Diagnosis not present

## 2016-12-14 DIAGNOSIS — R112 Nausea with vomiting, unspecified: Secondary | ICD-10-CM | POA: Diagnosis present

## 2016-12-14 NOTE — Progress Notes (Signed)
   Subjective:   Patient ID: [name]    DOB: [birth year], 83 y.o. female   MRN: [id number]  CC: dizziness   HPI: [name] is a 83 y.o. female who presents to clinic today with her daughter in law.  Problems discussed today are as follows:   Patient c/o dizziness when laying down and rolling over onto right side.  She reports that it lasts a little while and resolves when she turns to her left side again.   It does not happen when rolling over to left side to sleep.  She denies nausea and vomiting .  This has been happening every night over the past week and a half.  Due to these symptoms, she has been sleeping on her back and on her left side more.   She denies headaches.  Has also been experiencing ringing in her ears that started around the same time.  Ringing comes and goes, is bilateral, and occurs more in the early part of the night.  No changes in hearing.  No vision changes .    Uses a walker at home to ambulate.  Has no history of falls recently.     Social: Lives at home with her daughter, son, and grandson. She does not smoke cigarettes but she dips snuff.  Surgical: colon ca surgery 2008,  Breast ca (masectomy 2012),  heart attack 2008 stent placed.  ROS: She denies fevers, chills, abdominal pain, shortness of breath.     Medications reviewed.  Objective:   BP 110/78 (BP Location: Left Arm, Patient Position: Sitting, Cuff Size: Small)   Pulse 90   Temp 98.1 F (36.7 C) (Oral)   Wt 115 lb (52.2 kg)   SpO2 100%   BMI 23.23 kg/m  Vitals and nursing note reviewed.  General: elderly female, in no acute distress HEENT: NCAT, MMM, o/p clear, TM visualized and clear bilaterally, no hearing deficits noted on exam Neck: supple, non-tender without lad CV: RRR no MRG Lungs: CTA B/L with normal work of breathing Abdomen: soft, non-tender, no masses, +bs Skin: warm, dry, no rashes or lesions, cap refill < 2 seconds Extremities: warm and well perfused, normal tone Psych:  mood appropriate   Assessment & Plan:   83 yo F presents to clinic for dizziness and ringing in ears.   Dizziness Exam findings and history suggest possible benign positional vertigo.   Will need to perform maneuvers to evaluate.  -Difficult to move patient to examination table as she uses a wheelchair.  -No medications necessary at this time -Patient would likely benefit from referral to PT vestibular rehab for evaluation for possible BPPV.  -Educated daughter on maneuvers that can be done at home to reposition otoliths.   -Referral provided and will follow up with recommendations   Orders Placed This Encounter  Procedures  . PT vestibular rehab    Standing Status:   Future    Standing Expiration Date:   12/14/2017   Lovenia Kim, MD [place], PGY-1 12/18/2016 11:43 AM

## 2016-12-14 NOTE — Patient Instructions (Signed)
You were seen in clinic today for dizziness when you turn your head to the right at night to go to bed and also for new onset ringing in your ears over the past week and half.  You were referred to Vestibular rehab PT to be evaluated for BPPV.  You can expect a call from the physical therapist's office to set up an appointment.

## 2016-12-18 NOTE — Assessment & Plan Note (Signed)
Exam findings and history suggest possible benign positional vertigo.   Will need to perform maneuvers to evaluate.  -Difficult to move patient to examination table as she uses a wheelchair.  -No medications necessary at this time -Patient would likely benefit from referral to PT vestibular rehab for evaluation for possible BPPV.  -Educated daughter on maneuvers that can be done at home to reposition otoliths.   -Referral provided and will follow up with recommendations

## 2017-01-01 ENCOUNTER — Telehealth: Payer: Self-pay

## 2017-01-01 DIAGNOSIS — M546 Pain in thoracic spine: Secondary | ICD-10-CM

## 2017-01-01 NOTE — Telephone Encounter (Signed)
Pt needs tramadol refilled. Please call when ready to pick up. Ottis Stain, CMA

## 2017-01-02 ENCOUNTER — Telehealth: Payer: Self-pay | Admitting: Student

## 2017-01-02 DIAGNOSIS — M546 Pain in thoracic spine: Secondary | ICD-10-CM

## 2017-01-02 MED ORDER — TRAMADOL HCL 50 MG PO TABS: 50.0000 mg | ORAL_TABLET | Freq: Three times a day (TID) | ORAL | 1 refills | Status: DC | PRN

## 2017-01-02 MED ORDER — TRAMADOL HCL 50 MG PO TABS: 50.0000 mg | ORAL_TABLET | Freq: Three times a day (TID) | ORAL | 0 refills | Status: DC | PRN

## 2017-01-02 NOTE — Telephone Encounter (Signed)
tramadol left at front. Please inform the pateint

## 2017-01-02 NOTE — Telephone Encounter (Signed)
Tramadol left at front. Please inform the pateint

## 2017-01-02 NOTE — Telephone Encounter (Signed)
Patient is aware that script is ready for pick up. Jazmin Hartsell,CMA  

## 2017-01-02 NOTE — Telephone Encounter (Signed)
LM for pharmacy with script information on it.  Patient's daughter is unable to get to here to pick up the script due to a fall and wanted this called in for her. Arra Connaughton,CMA

## 2017-01-08 ENCOUNTER — Other Ambulatory Visit: Payer: Self-pay | Admitting: *Deleted

## 2017-01-08 MED ORDER — ROSUVASTATIN CALCIUM 20 MG PO TABS: 20.0000 mg | ORAL_TABLET | Freq: Every day | ORAL | 6 refills | Status: DC

## 2017-01-24 ENCOUNTER — Ambulatory Visit (INDEPENDENT_AMBULATORY_CARE_PROVIDER_SITE_OTHER): Payer: Medicare Other | Admitting: Podiatry

## 2017-01-24 ENCOUNTER — Encounter: Payer: Self-pay | Admitting: Podiatry

## 2017-01-24 VITALS — BP 159/75 | HR 86 | Resp 18

## 2017-01-24 DIAGNOSIS — B351 Tinea unguium: Secondary | ICD-10-CM | POA: Diagnosis not present

## 2017-01-24 DIAGNOSIS — M79676 Pain in unspecified toe(s): Secondary | ICD-10-CM

## 2017-01-24 DIAGNOSIS — I739 Peripheral vascular disease, unspecified: Secondary | ICD-10-CM

## 2017-01-24 NOTE — Patient Instructions (Signed)

## 2017-01-24 NOTE — Progress Notes (Signed)
Patient ID: [name], female   DOB: [birth year], 83 y.o.   MRN: [id number]   Subjective: This patient presents again complaining of thickened and elongated toenails walking wearing shoes and requests toenail debridement. Patient's daughter is present in treatment room  Objective: Orientated 3 Are full pitting edema bilaterally DP and PT pulses trace palpable bilaterally Capillary reflex delayed bilaterally Sensation to 10 g monofilament wire intact 5/5 bilaterally Vibratory sensation nonreactive bilaterally Ankle reflex reactive bilaterally Dorsi flexion right 5/5 right and 4/5 left Plantar flexion 5/5 bilaterally Patient walks slowly with walker Atrophic skin bilaterally No open skin lesions bilaterally The toenails are hypertrophic, elongated, deformed, discolored and tender direct palpation 6-10  Assessment: Symptomatic onychomycoses 6-10 Type II diabetic with peripheral arterial disease and peripheral neuropathy Scaling lateral right heel  Plan: Debridement toenails 6-10 mechanically and electronically without any bleeding  Reappoint 3 months

## 2017-01-29 ENCOUNTER — Telehealth: Payer: Self-pay | Admitting: Family Medicine

## 2017-01-29 ENCOUNTER — Ambulatory Visit: Payer: Medicare Other | Admitting: Family Medicine

## 2017-01-29 NOTE — Telephone Encounter (Signed)
Daughter would like a nurse to give pt a call to explain medications to pt. ep

## 2017-02-03 ENCOUNTER — Emergency Department (HOSPITAL_COMMUNITY)
Admit: 2017-02-03 | Discharge: 2017-02-03 | Disposition: A | Discharge status: Home / Self Care | Payer: Medicare Other | Attending: Emergency Medicine | Admitting: Emergency Medicine

## 2017-02-03 ENCOUNTER — Encounter (HOSPITAL_COMMUNITY): Payer: Self-pay | Admitting: Emergency Medicine

## 2017-02-03 ENCOUNTER — Emergency Department (HOSPITAL_COMMUNITY): Payer: Medicare Other

## 2017-02-03 ENCOUNTER — Telehealth: Payer: Self-pay | Admitting: Internal Medicine

## 2017-02-03 ENCOUNTER — Inpatient Hospital Stay (HOSPITAL_COMMUNITY)
Admission: EM | Admit: 2017-02-03 | Discharge: 2017-02-06 | DRG: 690 | Disposition: A | Discharge status: Home Health | Payer: Medicare Other | Attending: Family Medicine | Admitting: Family Medicine

## 2017-02-03 DIAGNOSIS — Z8679 Personal history of other diseases of the circulatory system: Secondary | ICD-10-CM

## 2017-02-03 DIAGNOSIS — F0390 Unspecified dementia without behavioral disturbance: Secondary | ICD-10-CM | POA: Diagnosis present

## 2017-02-03 DIAGNOSIS — Z79899 Other long term (current) drug therapy: Secondary | ICD-10-CM

## 2017-02-03 DIAGNOSIS — I5022 Chronic systolic (congestive) heart failure: Secondary | ICD-10-CM | POA: Diagnosis present

## 2017-02-03 DIAGNOSIS — I10 Essential (primary) hypertension: Secondary | ICD-10-CM | POA: Diagnosis not present

## 2017-02-03 DIAGNOSIS — G629 Polyneuropathy, unspecified: Secondary | ICD-10-CM | POA: Diagnosis present

## 2017-02-03 DIAGNOSIS — Z96651 Presence of right artificial knee joint: Secondary | ICD-10-CM | POA: Diagnosis present

## 2017-02-03 DIAGNOSIS — I739 Peripheral vascular disease, unspecified: Secondary | ICD-10-CM | POA: Diagnosis present

## 2017-02-03 DIAGNOSIS — N3000 Acute cystitis without hematuria: Secondary | ICD-10-CM | POA: Diagnosis not present

## 2017-02-03 DIAGNOSIS — I110 Hypertensive heart disease with heart failure: Secondary | ICD-10-CM | POA: Diagnosis present

## 2017-02-03 DIAGNOSIS — E43 Unspecified severe protein-calorie malnutrition: Secondary | ICD-10-CM | POA: Insufficient documentation

## 2017-02-03 DIAGNOSIS — Z720 Tobacco use: Secondary | ICD-10-CM

## 2017-02-03 DIAGNOSIS — E876 Hypokalemia: Secondary | ICD-10-CM | POA: Diagnosis not present

## 2017-02-03 DIAGNOSIS — B9620 Unspecified Escherichia coli [E. coli] as the cause of diseases classified elsewhere: Secondary | ICD-10-CM | POA: Diagnosis present

## 2017-02-03 DIAGNOSIS — R4182 Altered mental status, unspecified: Secondary | ICD-10-CM | POA: Diagnosis present

## 2017-02-03 DIAGNOSIS — R404 Transient alteration of awareness: Secondary | ICD-10-CM | POA: Diagnosis not present

## 2017-02-03 DIAGNOSIS — R443 Hallucinations, unspecified: Secondary | ICD-10-CM

## 2017-02-03 DIAGNOSIS — M7989 Other specified soft tissue disorders: Secondary | ICD-10-CM | POA: Diagnosis present

## 2017-02-03 DIAGNOSIS — J45909 Unspecified asthma, uncomplicated: Secondary | ICD-10-CM | POA: Diagnosis present

## 2017-02-03 DIAGNOSIS — E785 Hyperlipidemia, unspecified: Secondary | ICD-10-CM | POA: Diagnosis present

## 2017-02-03 DIAGNOSIS — M5030 Other cervical disc degeneration, unspecified cervical region: Secondary | ICD-10-CM | POA: Diagnosis present

## 2017-02-03 DIAGNOSIS — Z91048 Other nonmedicinal substance allergy status: Secondary | ICD-10-CM

## 2017-02-03 DIAGNOSIS — Z7982 Long term (current) use of aspirin: Secondary | ICD-10-CM

## 2017-02-03 DIAGNOSIS — Z888 Allergy status to other drugs, medicaments and biological substances status: Secondary | ICD-10-CM

## 2017-02-03 DIAGNOSIS — R6889 Other general symptoms and signs: Secondary | ICD-10-CM | POA: Diagnosis not present

## 2017-02-03 DIAGNOSIS — N390 Urinary tract infection, site not specified: Secondary | ICD-10-CM | POA: Diagnosis not present

## 2017-02-03 DIAGNOSIS — D638 Anemia in other chronic diseases classified elsewhere: Secondary | ICD-10-CM | POA: Diagnosis present

## 2017-02-03 DIAGNOSIS — Z86711 Personal history of pulmonary embolism: Secondary | ICD-10-CM

## 2017-02-03 DIAGNOSIS — R410 Disorientation, unspecified: Secondary | ICD-10-CM | POA: Diagnosis not present

## 2017-02-03 DIAGNOSIS — I252 Old myocardial infarction: Secondary | ICD-10-CM

## 2017-02-03 DIAGNOSIS — E1151 Type 2 diabetes mellitus with diabetic peripheral angiopathy without gangrene: Secondary | ICD-10-CM | POA: Diagnosis present

## 2017-02-03 DIAGNOSIS — I5030 Unspecified diastolic (congestive) heart failure: Secondary | ICD-10-CM

## 2017-02-03 DIAGNOSIS — R112 Nausea with vomiting, unspecified: Secondary | ICD-10-CM | POA: Diagnosis not present

## 2017-02-03 DIAGNOSIS — I2510 Atherosclerotic heart disease of native coronary artery without angina pectoris: Secondary | ICD-10-CM | POA: Diagnosis present

## 2017-02-03 DIAGNOSIS — K219 Gastro-esophageal reflux disease without esophagitis: Secondary | ICD-10-CM | POA: Diagnosis present

## 2017-02-03 DIAGNOSIS — E1140 Type 2 diabetes mellitus with diabetic neuropathy, unspecified: Secondary | ICD-10-CM | POA: Diagnosis present

## 2017-02-03 DIAGNOSIS — M1990 Unspecified osteoarthritis, unspecified site: Secondary | ICD-10-CM | POA: Diagnosis present

## 2017-02-03 DIAGNOSIS — Z86718 Personal history of other venous thrombosis and embolism: Secondary | ICD-10-CM

## 2017-02-03 LAB — COMPREHENSIVE METABOLIC PANEL
ALT: 8 U/L — AB (ref 14–54)
ANION GAP: 8 (ref 5–15)
AST: 14 U/L — ABNORMAL LOW (ref 15–41)
Albumin: 3 g/dL — ABNORMAL LOW (ref 3.5–5.0)
Alkaline Phosphatase: 53 U/L (ref 38–126)
BUN: 15 mg/dL (ref 6–20)
CHLORIDE: 108 mmol/L (ref 101–111)
CO2: 24 mmol/L (ref 22–32)
CREATININE: 0.94 mg/dL (ref 0.44–1.00)
Calcium: 10.7 mg/dL — ABNORMAL HIGH (ref 8.9–10.3)
GFR, EST AFRICAN AMERICAN: 60 mL/min — AB (ref >60)
GFR, EST NON AFRICAN AMERICAN: 51 mL/min — AB (ref >60)
Glucose, Bld: 122 mg/dL — ABNORMAL HIGH (ref 65–99)
Potassium: 3.5 mmol/L (ref 3.5–5.1)
SODIUM: 140 mmol/L (ref 135–145)
Total Bilirubin: 0.8 mg/dL (ref 0.3–1.2)
Total Protein: 7.6 g/dL (ref 6.5–8.1)

## 2017-02-03 LAB — CBC WITH DIFFERENTIAL/PLATELET
BASOS ABS: 0 10*3/uL (ref 0.0–0.1)
BASOS PCT: 0 %
EOS PCT: 2 %
Eosinophils Absolute: 0.1 10*3/uL (ref 0.0–0.7)
HEMATOCRIT: 31.6 % — AB (ref 36.0–46.0)
Hemoglobin: 10 g/dL — ABNORMAL LOW (ref 12.0–15.0)
Lymphocytes Relative: 31 %
Lymphs Abs: 1.6 10*3/uL (ref 0.7–4.0)
MCH: 22.2 pg — ABNORMAL LOW (ref 26.0–34.0)
MCHC: 31.6 g/dL (ref 30.0–36.0)
MCV: 70.2 fL — AB (ref 78.0–100.0)
MONO ABS: 0.3 10*3/uL (ref 0.1–1.0)
MONOS PCT: 6 %
NEUTROS ABS: 3.1 10*3/uL (ref 1.7–7.7)
Neutrophils Relative %: 61 %
PLATELETS: 177 10*3/uL (ref 150–400)
RBC: 4.5 MIL/uL (ref 3.87–5.11)
RDW: 16.6 % — AB (ref 11.5–15.5)
WBC: 5.1 10*3/uL (ref 4.0–10.5)

## 2017-02-03 LAB — URINALYSIS, ROUTINE W REFLEX MICROSCOPIC
Bilirubin Urine: NEGATIVE
Glucose, UA: NEGATIVE mg/dL
Ketones, ur: NEGATIVE mg/dL
Nitrite: POSITIVE — AB
PH: 6 (ref 5.0–8.0)
Protein, ur: 30 mg/dL — AB
SPECIFIC GRAVITY, URINE: 1.011 (ref 1.005–1.030)
SQUAMOUS EPITHELIAL / LPF: NONE SEEN

## 2017-02-03 LAB — I-STAT TROPONIN, ED: Troponin i, poc: 0.01 ng/mL (ref 0.00–0.08)

## 2017-02-03 LAB — ETHANOL

## 2017-02-03 LAB — I-STAT CG4 LACTIC ACID, ED: LACTIC ACID, VENOUS: 0.98 mmol/L (ref 0.5–1.9)

## 2017-02-03 LAB — SALICYLATE LEVEL

## 2017-02-03 LAB — ACETAMINOPHEN LEVEL

## 2017-02-03 MED ORDER — CEFTRIAXONE SODIUM 1 G IJ SOLR
1.0000 g | Freq: Once | INTRAMUSCULAR | Status: AC
  Administered 2017-02-03: 1 g via INTRAVENOUS
  Filled 2017-02-03: qty 10

## 2017-02-03 MED ORDER — ROSUVASTATIN CALCIUM 20 MG PO TABS
20.0000 mg | ORAL_TABLET | Freq: Every day | ORAL | Status: DC
  Administered 2017-02-03 – 2017-02-05 (×3): 20 mg via ORAL
  Filled 2017-02-03 (×4): qty 1

## 2017-02-03 MED ORDER — ENOXAPARIN SODIUM 30 MG/0.3ML ~~LOC~~ SOLN
30.0000 mg | SUBCUTANEOUS | Status: DC
Start: 2017-02-04 — End: 2017-02-04
  Administered 2017-02-04: 30 mg via SUBCUTANEOUS
  Filled 2017-02-03: qty 0.3

## 2017-02-03 MED ORDER — CLOPIDOGREL BISULFATE 75 MG PO TABS: 75.0000 mg | ORAL_TABLET | Freq: Every day | ORAL | Status: DC

## 2017-02-03 MED ORDER — ASPIRIN 81 MG PO CHEW
81.0000 mg | CHEWABLE_TABLET | Freq: Every day | ORAL | Status: DC
  Administered 2017-02-04 – 2017-02-06 (×3): 81 mg via ORAL
  Filled 2017-02-03 (×3): qty 1

## 2017-02-03 MED ORDER — LOSARTAN POTASSIUM 50 MG PO TABS: 50.0000 mg | ORAL_TABLET | Freq: Every day | ORAL | Status: DC

## 2017-02-03 MED ORDER — FERROUS SULFATE 325 (65 FE) MG PO TABS
325.0000 mg | ORAL_TABLET | Freq: Every day | ORAL | Status: DC
  Administered 2017-02-04 – 2017-02-06 (×3): 325 mg via ORAL
  Filled 2017-02-03 (×3): qty 1

## 2017-02-03 MED ORDER — AMLODIPINE BESYLATE 5 MG PO TABS
5.0000 mg | ORAL_TABLET | Freq: Every day | ORAL | Status: DC
  Administered 2017-02-03 – 2017-02-06 (×4): 5 mg via ORAL
  Filled 2017-02-03 (×4): qty 1

## 2017-02-03 MED ORDER — CARVEDILOL 12.5 MG PO TABS
12.5000 mg | ORAL_TABLET | Freq: Two times a day (BID) | ORAL | Status: DC
  Administered 2017-02-04 – 2017-02-06 (×5): 12.5 mg via ORAL
  Filled 2017-02-03 (×5): qty 1

## 2017-02-03 MED ORDER — ACETAMINOPHEN 325 MG PO TABS
650.0000 mg | ORAL_TABLET | Freq: Four times a day (QID) | ORAL | Status: DC | PRN
  Administered 2017-02-05 (×2): 650 mg via ORAL
  Filled 2017-02-03 (×2): qty 2

## 2017-02-03 MED ORDER — ACETAMINOPHEN 650 MG RE SUPP: 650.0000 mg | Freq: Four times a day (QID) | RECTAL | Status: DC | PRN

## 2017-02-03 MED ORDER — PRO-STAT SUGAR FREE PO LIQD
30.0000 mL | Freq: Two times a day (BID) | ORAL | Status: DC
  Administered 2017-02-04 – 2017-02-05 (×3): 30 mL via ORAL
  Filled 2017-02-03 (×2): qty 30

## 2017-02-03 MED ORDER — OLOPATADINE HCL 0.1 % OP SOLN
1.0000 [drp] | Freq: Two times a day (BID) | OPHTHALMIC | Status: DC
  Administered 2017-02-04 – 2017-02-06 (×5): 1 [drp] via OPHTHALMIC
  Filled 2017-02-03: qty 5

## 2017-02-03 MED ORDER — LORATADINE 10 MG PO TABS
5.0000 mg | ORAL_TABLET | Freq: Every day | ORAL | Status: DC
  Administered 2017-02-04 – 2017-02-06 (×3): 5 mg via ORAL
  Filled 2017-02-03 (×4): qty 1

## 2017-02-03 NOTE — Progress Notes (Signed)
VASCULAR LAB PRELIMINARY  PRELIMINARY  PRELIMINARY  PRELIMINARY  Left upper extremity venous duplex completed.    Preliminary report:  There is no DVT or SVT noted in the left upper extremity.  Called report to Dr. Darvin Neighbours, [facility], RVT 02/03/2017, 6:33 PM

## 2017-02-03 NOTE — ED Notes (Signed)
Returned from [employer]. Pt remains alert, continues to protest being here because "I feel fine"

## 2017-02-03 NOTE — Telephone Encounter (Signed)
**  After Hours/ Emergency Line Call*  Received a call to report that [name].  Spoke with daughter, Ms. Caldwell.  - reports patient is beginning to get confused. Cannot get her to eat but drinking sips of water. Reports that she is weak. Reports that she had an appointment last week but could not get her to go because she was too weak.  - reports of symptoms of confusion for about 1 month. Weakness started about 1 week ago.  - reports she has not been evaluated for memory issues, confusion, or weakness.  - daughter asked if they should bring her to the hospital. I reported that if she should be evaluated in the ED. Daughter said she would call EMS.   Will forward to PCP.  Smiley Houseman, MD PGY-2, [facility]

## 2017-02-03 NOTE — H&P (Signed)
[facility] Admission History and Physical Service Pager: [phone]  [name] Medical record number: [id number] Date of birth: 1924-07-14 Age: 83 y.o. Gender: female  Primary Care Provider: Kathrine Cords, MD Consultants: none Code Status: FULL until able to verify with family  Chief Complaint: altered mental status  Assessment and Plan: [name] is a 83 y.o. female presenting with decreased mentation per daughter x 1 week, lab findings suggestive of UTI. PMH is significant for dementia, hx of breast cancer and colon cancer, T2DM, HLD, PAD/CAD, neuropathy, HTN, GERD, OA, tobacco abuse (snuff), HFpEF (last echo 2016 with EF 50-55% and G1DD), hx of PE diagnosed 08/2016 and started on eliquis.   Altered mental status: Daughter called our clinic emergency line to report some decreased mentation and increased confusion over the past week on with some decreased by mouth intake. Additionally, patient reports increased urinary frequency. Afebrile, chest x-ray without infiltrate, CT head no acute abnormality. Urine appears infected with nitrites, leukocytes, many bacteria and protein. Urine culture sent. Ceftriaxone started by ED M.D. No suprapubic tenderness or CVA tenderness on exam. Less concerned for pyelonephritis. Curious whether iron supplementation can increase constipation and predispose patient to UTI. Patient denied taking Colace at home. - Admit to [facility], Dr. Ardelia Mems attending - CTX x 1 in ED  - Consider transition to by mouth antibiotics in a.m. - Monitor fever curve  HTN: Patient with known hypertension, did not take any medications this morning. Restart home Norvasc, Coreg.  -Restart Norvasc, Coreg -Hold home Cozaar, do not want to drop blood pressure in the setting of UTI  History of PE: Patient was diagnosed with submassive PE in October 2017, and discharged on Eliquis to a SNIF. Patient denying any respiratory symptoms today,  although per pharmacy review patient has never picked up Eliquis from her pharmacy. Patient cannot give any history on her medications. -Consider additional investigation into whether this patient has Eliquis at home or not, attempted to call multiple daughters and no answer -Continue to monitor respiratory status -Per pharmacy med rec, patient has been picking up Plavix, although this was supposed to be stopped after discharge in October 2017, will hold Plavix  Left Hand Swelling: noted in the ED. Patient unable to give history regarding this. There is an IV on the same wrist; unsure if symptoms present prior to placement of IV. However, ED ordered upper extremity ultrasound with preliminary read without signs of DVT.  - follow up final read of upper extremity   T2DM: Last hemoglobin A1c 08/2016 8.8. No oral or subcutaneous treatment for diabetes per chart review. Given advanced age, would not desire tight blood sugar control in this patient. -Daily CBGs on BMPs  HFpEF: last echo 2016 with EF 50-55% and G1DD. Clinically euvolemic on exam. -monitor fluid status on exam  HLD:  - continue Crestor  FEN/GI: Regular diet Prophylaxis: Lovenox  Disposition: Admit to MedSurg  History of Present Illness:  [name] is a 83 y.o. female presenting via EMS due to daughter's concerns for increased confusion 1 week. Patient can recall city and state, cannot recall year or month. Knows her age and name. Denies any pain at present. Notes some increased urinary frequency starting last week, urinating around her adult diapers she reports. Denies dysuria, fever, chills, sweats. Reports good by mouth intake. Notably, patient uses snuff tobacco. She lives with one of her 5 daughters, [name].  Review Of Systems: Per HPI with the following additions: Patient denies  pain, otherwise unable to give any additional review of systems due to dementia.  ROS  Patient Active Problem List   Diagnosis Date Noted  .  Hypokalemia 10/13/2016  . Pressure injury of skin 08/31/2016  . Pulmonary embolus ([place]) 08/30/2016  . Mild dementia 06/01/2016  . Depression 01/20/2016  . Suicidal ideation 01/19/2016  . Prolapse of female pelvic organs 12/28/2015  . Leg weakness, bilateral 12/08/2015  . Weakness of right lower extremity 11/25/2015  . (HFpEF) heart failure with preserved ejection fraction ([place])   . CAD in native artery   . Gout 05/14/2015  . Dyslipidemia   . Essential hypertension   . Seasonal allergies 02/10/2015  . Stroke-like symptoms   . Stroke-like episode ([place]) 01/25/2015  . Syncope and collapse 01/25/2015  . Dizziness 09/19/2014  . Anemia of chronic illness 08/19/2014  . Acute superficial venous thrombosis of right lower extremity 08/19/2014  . Hypercalcemia 06/09/2014  . Breast cancer, left breast ([place]) 06/09/2014  . Breast cancer, right breast ([place]) 06/09/2014  . Edema of left lower extremity 03/30/2014  . PAD (peripheral artery disease) ([place]) 03/30/2014  . History of colon cancer 12/11/2013  . History of fall 05/15/2013  . Bilateral leg pain 03/07/2013  . Lower extremity edema 02/19/2013  . Stricture and stenosis of esophagus 10/27/2012  . Thoracic back pain 05/24/2012  . Tobacco abuse 06/02/2011  . History of breast cancer in female, bilateral.  Mastectomies 10/24/2010. 04/26/2011  . Hiatal hernia 03/22/2011  . Colon polyp 03/22/2011  . Poor circulation 03/22/2011  . 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 Neuropathy ([place]) 08/25/2009  . ANEMIA-IRON DEFICIENCY 04/29/2007  . HLD (hyperlipidemia) 01/29/2007  . Controlled type 2 diabetes mellitus without complication, without long-term current use of insulin ([place]) 01/10/2007  . HYPERTENSION, BENIGN SYSTEMIC 01/10/2007  . Coronary atherosclerosis 01/10/2007  . Chronic systolic heart failure ([place]) 01/10/2007  . Asthma 01/10/2007  . Reflux esophagitis 01/10/2007  . Osteoarthritis, multiple sites 01/10/2007    Past Medical  History: Past Medical History:  Diagnosis Date  . Allergy   . Anemia    Iron def anemia  . Arthritis   . Asthma   . Breast cancer ([place]) 10/2010   Invasive Ductal Carcinoma, s/p bilateral mastectomy, now on Tamoxifen. Followed by Dr Jamse Arn.   . Breast cancer ([place]) 2011   s/p Bilateral masectomy  . CAD (coronary artery disease)   . Carotid artery aneurysm ([place]) 08/2010   right ICA, 5 x 22m  . CHF (congestive heart failure) ([facility])    Systolic   . Colon cancer ([place]   . Colon cancer ([place] 12/11/2013  . DDD (degenerative disc disease), cervical   . Diabetes mellitus   . Diverticulosis   . GERD (gastroesophageal reflux disease)   . H/O: GI bleed   . Hiatal hernia   . History of colon cancer   . Hypercalcemia 06/09/2014  . Hyperlipidemia   . Hypertension   . Myocardial infarction   . Neuropathy ([place]   . Pneumonia 03/2012  . Shortness of breath     Past Surgical History: Past Surgical History:  Procedure Laterality Date  . Breast masectomy  10/2010   Bilateral masectomy by Dr NLucia Gaskinss/p invasive ductal carcinoma.  .Marland KitchenBREAST SURGERY    . Cardiac  Catherization  2007   Severe 3-vessel disease.  EF 20-25%.  . ESOPHAGOGASTRODUODENOSCOPY  2001   Esophageal Tear  . ESOPHAGOGASTRODUODENOSCOPY  10/27/2012   Procedure: ESOPHAGOGASTRODUODENOSCOPY (EGD);  Surgeon: JIrene Shipper MD;  [facility]  Service: Endoscopy;  Laterality: N/A;  pat  . JOINT REPLACEMENT Right 2001   Knee  . TOTAL KNEE ARTHROPLASTY  2001    Social History: Social History  Substance Use Topics  . Smoking status: Never Smoker  . Smokeless tobacco: Current User    Types: Snuff  . Alcohol use No   Additional social history: Lives with daughter [name], went to a SNF this fall after hospitalization. Denies alcohol use, uses snuff.  Please also refer to relevant sections of EMR.  Family History: Family History  Problem Relation Age of Onset  . Sickle cell anemia Other   . Heart disease Mother   .  Heart disease Father   . Cancer Daughter 75    breast ca  . Hypertension Daughter   . Cancer Daughter 52    breast ca  . Hypertension Daughter   . Heart disease Son     Poor circulation-Left Leg  . Diabetes Daughter   . Hypertension Daughter   . Hyperlipidemia Daughter     Poor circulation- Toe amputation   Allergies and Medications: Allergies  Allergen Reactions  . Lisinopril Cough   No current facility-administered medications on file prior to encounter.    Current Outpatient Prescriptions on File Prior to Encounter  Medication Sig Dispense Refill  . acetaminophen (TYLENOL) 500 MG tablet Take 500 mg by mouth 2 (two) times daily. Take every 6 hours as needed for moderate pain    . albuterol (PROVENTIL HFA;VENTOLIN HFA) 108 (90 BASE) MCG/ACT inhaler Inhale 2 puffs into the lungs every 6 (six) hours as needed. For shortness of breath 1 Inhaler 6  . Amino Acids-Protein Hydrolys (FEEDING SUPPLEMENT, PRO-STAT SUGAR FREE 64,) LIQD Take 30 mLs by mouth 2 (two) times daily with a meal.    . amLODipine (NORVASC) 5 MG tablet Take 1 tablet (5 mg total) by mouth daily. 30 tablet 6  . apixaban (ELIQUIS) 5 MG TABS tablet Take 1 tablet (5 mg total) by mouth 2 (two) times daily. 60 tablet 0  . aspirin (ASPIRIN CHILDRENS) 81 MG chewable tablet Chew 81 mg by mouth daily.     . bisacodyl (BISACODYL) 5 MG EC tablet Take 5 mg by mouth every 3 (three) days.    . Blood Pressure KIT Use the blood pressure kit to check you blood pressure twice a day 1 each 0  . capsaicin (ZOSTRIX) 0.025 % cream APPLY TO AFFECTED AREA TWICE A DAY 60 g 1  . carvedilol (COREG) 12.5 MG tablet TAKE 1 TABLET (12.5 MG TOTAL) BY MOUTH 2 (TWO) TIMES DAILY WITH A MEAL. 60 tablet 6  . diclofenac sodium (VOLTAREN) 1 % GEL APPLY 2 grams topically 4 TIMES DAILY AS NEEDED FOR PAIN 100 g 1  . docusate sodium (COLACE) 100 MG capsule Take 1 capsule (100 mg total) by mouth daily. 10 capsule 0  . docusate sodium (COLACE) 100 MG capsule Take  1 capsule (100 mg total) by mouth 2 (two) times daily as needed for mild constipation. 30 capsule 0  . ferrous sulfate 325 (65 FE) MG tablet Take 1 tablet (325 mg total) by mouth daily with breakfast. 30 tablet 2  . gabapentin (NEURONTIN) 300 MG capsule Take 1 capsule (300 mg total) by mouth 2 (two) times daily. 60 capsule 6  . glucose blood test strip Use as instructed Dx. Code 250.02 100 each 12  . guaifenesin (ROBITUSSIN) 100 MG/5ML syrup Take 10 mLs (200 mg total) by mouth 3 (three) times daily as needed for  cough. 120 mL 0  . loratadine (CLARITIN) 10 MG tablet TAKE 1/2 TABLET BY MOUTH DAILY 15 tablet 0  . losartan (COZAAR) 50 MG tablet TAKE 1 TABLET (50 MG TOTAL) BY MOUTH AT BEDTIME. 30 tablet 6  . Menthol, Topical Analgesic, (BIOFREEZE EX) Apply 1 application topically 2 (two) times daily as needed (pain).    . Multiple Vitamins-Minerals (DECUBI-VITE) CAPS Take 1 capsule by mouth daily. Stop date 10/12/16    . PATADAY 0.2 % SOLN APPLY 1 DROP TO EYE DAILY. 2.5 mL 0  . ranitidine (ZANTAC) 150 MG capsule TAKE ONE CAPSULE BY MOUTH TWICE A DAY AS NEEDED HEARTBURN 60 capsule 2  . rosuvastatin (CRESTOR) 20 MG tablet Take 1 tablet (20 mg total) by mouth at bedtime. 30 tablet 6  . saccharomyces boulardii (FLORASTOR) 250 MG capsule Take 250 mg by mouth 2 (two) times daily. Stop date 10/02/16    . traMADol (ULTRAM) 50 MG tablet Take 1 tablet (50 mg total) by mouth every 8 (eight) hours as needed for severe pain. 60 tablet 1  . [DISCONTINUED] cetirizine (ZYRTEC) 5 MG tablet Take 1 tablet (5 mg total) by mouth daily. Take at night time as it can cause some drowsiness 30 tablet 4    Objective: BP (!) 153/51 (BP Location: Right Arm)   Pulse 70   Temp 98.6 F (37 C) (Oral)   Resp 14   Wt 115 lb (52.2 kg)   SpO2 100%   BMI 23.23 kg/m  Exam: General: Elderly female lying in bed in no acute distress. Eyes: EOMI, PEERLA ENTM: MMM, oropharnyx normal Neck: supple, no LAD Cardiovascular: RRR, no  murmur, normal distals pulses bilaterally Respiratory: CTAB, easy WOB Gastrointestinal: SNTND, +BS MSK: L hand mildly swollen compared to R, IV in L wrist, diffusely decreased muscle tone and bulk Derm: no rashes or wounds Neuro: CN II-XII grossly intact, patient with trouble following certain commands Psych: pleasantly demented, oriented to self, city and state.   Labs and Imaging: CBC BMET   Recent Labs Lab 02/03/17 1500  WBC 5.1  HGB 10.0*  HCT 31.6*  PLT 177    Recent Labs Lab 02/03/17 1500  NA 140  K 3.5  CL 108  CO2 24  BUN 15  CREATININE 0.94  GLUCOSE 122*  CALCIUM 10.7*    UA: positive nitrite, trace Leukocytes, small hgb, 0-5 rbc, 0-5 wbc, no squamous cells  EKG: NSR, no changes from prior.  Lactic acid 0.98 istat troponin 9.52 Salicylate level: < 7 Tylenol level: <10 Ethanol: <5 LFTs: unremarkable.   Dg Chest 2 View  Result Date: 02/03/2017 CLINICAL DATA:  Altered mental status EXAM: CHEST  2 VIEW COMPARISON:  08/30/2016 FINDINGS: Cardiomediastinal silhouette is stable. No infiltrate or pleural effusion. No pulmonary edema. Osteopenia and degenerative changes thoracic spine again noted. IMPRESSION: No active cardiopulmonary disease. Electronically Signed   By: Lahoma Crocker M.D.   On: 02/03/2017 15:12   Ct Head Wo Contrast  Result Date: 02/03/2017 CLINICAL DATA:  Altered mental status, hallucinations earlier today EXAM: CT HEAD WITHOUT CONTRAST TECHNIQUE: Contiguous axial images were obtained from the base of the skull through the vertex without intravenous contrast. COMPARISON:  11/24/2015 FINDINGS: Brain: No intracranial hemorrhage, mass effect or midline shift. No definite acute cortical infarction. No mass lesion is noted on this unenhanced scan. Stable atrophy and chronic white matter disease. No mass lesion is noted on this unenhanced scan. Ventricular size is stable from prior exam. Vascular: Atherosclerotic calcifications of carotid siphon again  noted.  Extensive atherosclerotic calcifications vertebral arteries. Skull: No skull fracture. Sinuses/Orbits: No acute findings Other: None IMPRESSION: No acute intracranial abnormality. Stable atrophy and chronic white matter disease. No definite acute cortical infarction. Electronically Signed   By: Lahoma Crocker M.D.   On: 02/03/2017 17:04     Sela Hilding, MD 02/03/2017, 5:15 PM PGY-1, [facility] pager: [phone], text pages welcome  UPPER LEVEL ADDENDUM  I have read the above note and made revisions highlighted in blue.  Smiley Houseman, MD PGY-2 [facility] Pager [phone]

## 2017-02-03 NOTE — ED Notes (Signed)
Patient denies pain and is resting comfortably.  

## 2017-02-03 NOTE — ED Triage Notes (Signed)
Pt lives with daughter.  Per report, pt has been experiencing some hallucinations earlier today.  Pt appears alert, is aware of where she is and why she is here but states "im not confused'. No complaints

## 2017-02-03 NOTE — ED Notes (Signed)
Did ekg on patient pt is resting shown to Dr Rex Kras

## 2017-02-03 NOTE — ED Notes (Signed)
Patient transported to X-ray 

## 2017-02-03 NOTE — ED Provider Notes (Signed)
[facility] DEPT Provider Note   CSN: [id number] Arrival date & time: 02/03/17 1348     History    Chief Complaint  Patient presents with  . Altered Mental Status     HPI [name] is a 83 y.o. female.  83yo F w/ extensive PMH including breast CA, CAD s/p MI, sCHF who p/w Altered mental status. The patient lives with her daughter and was brought in by EMS with reports of having hallucinations earlier today. Family stated to EMS that she was reaching out and talking about things that weren't actually there. Patient states that she is not confused and denies any complaints whatsoever. Specifically, she denies any chest pain, shortness of breath, abdominal pain, or headache. She denies any urinary symptoms.  LEVEL 5 CAVEAT DUE TO AMS Past Medical History:  Diagnosis Date  . Allergy   . Anemia    Iron def anemia  . Arthritis   . Asthma   . Breast cancer ([place]) 10/2010   Invasive Ductal Carcinoma, s/p bilateral mastectomy, now on Tamoxifen. Followed by Dr Jamse Arn.   . Breast cancer ([place]) 2011   s/p Bilateral masectomy  . CAD (coronary artery disease)   . Carotid artery aneurysm ([place]) 08/2010   right ICA, 5 x 54m  . CHF (congestive heart failure) (HCC)    Systolic   . Colon cancer ([place]   . Colon cancer ([place] 12/11/2013  . DDD (degenerative disc disease), cervical   . Diabetes mellitus   . Diverticulosis   . GERD (gastroesophageal reflux disease)   . H/O: GI bleed   . Hiatal hernia   . History of colon cancer   . Hypercalcemia 06/09/2014  . Hyperlipidemia   . Hypertension   . Myocardial infarction   . Neuropathy ([place]   . Pneumonia 03/2012  . Shortness of breath      Patient Active Problem List   Diagnosis Date Noted  . Hypokalemia 10/13/2016  . Pressure injury of skin 08/31/2016  . Pulmonary embolus ([facility] 08/30/2016  . Mild dementia 06/01/2016  . Depression 01/20/2016  . Suicidal ideation 01/19/2016  . Prolapse of female pelvic organs 12/28/2015  . Leg  weakness, bilateral 12/08/2015  . Weakness of right lower extremity 11/25/2015  . (HFpEF) heart failure with preserved ejection fraction ([place]   . CAD in native artery   . Gout 05/14/2015  . Dyslipidemia   . Essential hypertension   . Seasonal allergies 02/10/2015  . Stroke-like symptoms   . Stroke-like episode ([place] 01/25/2015  . Syncope and collapse 01/25/2015  . Dizziness 09/19/2014  . Anemia of chronic illness 08/19/2014  . Acute superficial venous thrombosis of right lower extremity 08/19/2014  . Hypercalcemia 06/09/2014  . Breast cancer, left breast ([place] 06/09/2014  . Breast cancer, right breast ([place] 06/09/2014  . Edema of left lower extremity 03/30/2014  . PAD (peripheral artery disease) ([place] 03/30/2014  . History of colon cancer 12/11/2013  . History of fall 05/15/2013  . Bilateral leg pain 03/07/2013  . Lower extremity edema 02/19/2013  . Stricture and stenosis of esophagus 10/27/2012  . Thoracic back pain 05/24/2012  . Tobacco abuse 06/02/2011  . History of breast cancer in female, bilateral.  Mastectomies 10/24/2010. 04/26/2011  . Hiatal hernia 03/22/2011  . Colon polyp 03/22/2011  . Poor circulation 03/22/2011  . 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 Neuropathy ([place] 08/25/2009  . ANEMIA-IRON DEFICIENCY 04/29/2007  . HLD (hyperlipidemia) 01/29/2007  . Controlled type 2 diabetes mellitus without complication, without long-term current use  of insulin ([place]) 01/10/2007  . HYPERTENSION, BENIGN SYSTEMIC 01/10/2007  . Coronary atherosclerosis 01/10/2007  . Chronic systolic heart failure ([place]) 01/10/2007  . Asthma 01/10/2007  . Reflux esophagitis 01/10/2007  . Osteoarthritis, multiple sites 01/10/2007    Past Surgical History:  Procedure Laterality Date  . Breast masectomy  10/2010   Bilateral masectomy by Dr Lucia Gaskins s/p invasive ductal carcinoma.  Marland Kitchen BREAST SURGERY    . Cardiac  Catherization  2007   Severe 3-vessel disease.  EF 20-25%.  .  ESOPHAGOGASTRODUODENOSCOPY  2001   Esophageal Tear  . ESOPHAGOGASTRODUODENOSCOPY  10/27/2012   Procedure: ESOPHAGOGASTRODUODENOSCOPY (EGD);  Surgeon: Irene Shipper, MD;  [facility] END[facility]COPY;  Service: Endoscopy;  Laterality: N/A;  pat  . JOINT REPLACEMENT Right 2001   Knee  . TOTAL KNEE ARTHROPLASTY  2001    OB History    No data available        Home Medications    Prior to Admission medications   Medication Sig Start Date End Date Taking? Authorizing Provider  acetaminophen (TYLENOL) 500 MG tablet Take 500 mg by mouth 2 (two) times daily. Take every 6 hours as needed for moderate pain    Historical Provider, MD  albuterol (PROVENTIL HFA;VENTOLIN HFA) 108 (90 BASE) MCG/ACT inhaler Inhale 2 puffs into the lungs every 6 (six) hours as needed. For shortness of breath 01/18/15   [name], MD  Amino Acids-Protein Hydrolys (FEEDING SUPPLEMENT, PRO-STAT SUGAR FREE 64,) LIQD Take 30 mLs by mouth 2 (two) times daily with a meal.    Historical Provider, MD  amLODipine (NORVASC) 5 MG tablet Take 1 tablet (5 mg total) by mouth daily. 11/21/16   [name], MD  apixaban (ELIQUIS) 5 MG TABS tablet Take 1 tablet (5 mg total) by mouth 2 (two) times daily. 09/07/16   Carlyle Dolly, MD  aspirin (ASPIRIN CHILDRENS) 81 MG chewable tablet Chew 81 mg by mouth daily.     Historical Provider, MD  bisacodyl (BISACODYL) 5 MG EC tablet Take 5 mg by mouth every 3 (three) days.    Historical Provider, MD  Blood Pressure KIT Use the blood pressure kit to check you blood pressure twice a day 08/27/15   [name], MD  capsaicin (ZOSTRIX) 0.025 % cream APPLY TO AFFECTED AREA TWICE A DAY 04/11/16   [name], MD  carvedilol (COREG) 12.5 MG tablet TAKE 1 TABLET (12.5 MG TOTAL) BY MOUTH 2 (TWO) TIMES DAILY WITH A MEAL. 09/18/16   [name], MD  diclofenac sodium (VOLTAREN) 1 % GEL APPLY 2 grams topically 4 TIMES DAILY AS NEEDED FOR PAIN 09/23/15   [name], MD  docusate  sodium (COLACE) 100 MG capsule Take 1 capsule (100 mg total) by mouth daily. 09/12/16   Dinah C Ngetich, NP  docusate sodium (COLACE) 100 MG capsule Take 1 capsule (100 mg total) by mouth 2 (two) times daily as needed for mild constipation. 10/13/16   [name], MD  ferrous sulfate 325 (65 FE) MG tablet Take 1 tablet (325 mg total) by mouth daily with breakfast. 11/21/16   [name], MD  gabapentin (NEURONTIN) 300 MG capsule Take 1 capsule (300 mg total) by mouth 2 (two) times daily. 09/18/16   [name], MD  glucose blood test strip Use as instructed Dx. Code 250.02 09/28/14   [name], MD  guaifenesin (ROBITUSSIN) 100 MG/5ML syrup Take 10 mLs (200 mg total) by mouth 3 (three) times daily  as needed for cough. 08/15/16   [name], MD  loratadine (CLARITIN) 10 MG tablet TAKE 1/2 TABLET BY MOUTH DAILY 12/08/15   [name], MD  losartan (COZAAR) 50 MG tablet TAKE 1 TABLET (50 MG TOTAL) BY MOUTH AT BEDTIME. 11/21/16   [name], MD  Menthol, Topical Analgesic, (BIOFREEZE EX) Apply 1 application topically 2 (two) times daily as needed (pain).    Historical Provider, MD  Multiple Vitamins-Minerals (DECUBI-VITE) CAPS Take 1 capsule by mouth daily. Stop date 10/12/16    Historical Provider, MD  PATADAY 0.2 % SOLN APPLY 1 DROP TO EYE DAILY. 11/23/15   [name], MD  ranitidine (ZANTAC) 150 MG capsule TAKE ONE CAPSULE BY MOUTH TWICE A DAY AS NEEDED HEARTBURN 11/21/16   [name], MD  rosuvastatin (CRESTOR) 20 MG tablet Take 1 tablet (20 mg total) by mouth at bedtime. 01/08/17   Verner Mould, MD  saccharomyces boulardii (FLORASTOR) 250 MG capsule Take 250 mg by mouth 2 (two) times daily. Stop date 10/02/16    Historical Provider, MD  traMADol (ULTRAM) 50 MG tablet Take 1 tablet (50 mg total) by mouth every 8 (eight) hours as needed for severe pain. 01/02/17   Veatrice Bourbon, MD      Family History  Problem Relation Age of Onset  . Sickle cell  anemia Other   . Heart disease Mother   . Heart disease Father   . Cancer Daughter 22    breast ca  . Hypertension Daughter   . Cancer Daughter 58    breast ca  . Hypertension Daughter   . Heart disease Son     Poor circulation-Left Leg  . Diabetes Daughter   . Hypertension Daughter   . Hyperlipidemia Daughter     Poor circulation- Toe amputation     Social History  Substance Use Topics  . Smoking status: Never Smoker  . Smokeless tobacco: Current User    Types: Snuff  . Alcohol use No     Allergies     Lisinopril    Review of Systems  Unable to obtain ROS 2/2 AMS   Physical Exam Updated Vital Signs BP (!) 153/51 (BP Location: Right Arm)   Pulse 70   Temp 98.6 F (37 C) (Oral)   Resp 14   Wt 115 lb (52.2 kg)   SpO2 100%   BMI 23.23 kg/m   Physical Exam  Constitutional: She appears well-developed and well-nourished. No distress.  Awake, alert  HENT:  Head: Normocephalic and atraumatic.  Mouth/Throat: Oropharynx is clear and moist.  Eyes: Conjunctivae and EOM are normal. Pupils are equal, round, and reactive to light.  Neck: Neck supple.  Cardiovascular: Normal rate and regular rhythm.   No murmur heard. Pulmonary/Chest: Effort normal and breath sounds normal. No respiratory distress.  Abdominal: Soft. Bowel sounds are normal. She exhibits no distension. There is no tenderness.  Musculoskeletal: She exhibits edema (L lower arm/hand). She exhibits no tenderness.  Neurological: She is alert. She has normal reflexes. No cranial nerve deficit. She exhibits normal muscle tone.  Oriented to person and place, Fluent speech, confusion during finger to nose testing with patient asking "where is my nose?" and looking in bed sheets  no clonus 5/5 strength and normal sensation x all 4 extremities  Skin: Skin is warm and dry.  Psychiatric:  Calm cooperative  Nursing note and vitals reviewed.     ED Treatments / Results  Labs (all labs ordered are  listed,  but only abnormal results are displayed) Labs Reviewed  COMPREHENSIVE METABOLIC PANEL - Abnormal; Notable for the following:       Result Value   Glucose, Bld 122 (*)    Calcium 10.7 (*)    Albumin 3.0 (*)    AST 14 (*)    ALT 8 (*)    GFR calc non Af Amer 51 (*)    GFR calc Af Amer 60 (*)    All other components within normal limits  ACETAMINOPHEN LEVEL - Abnormal; Notable for the following:    Acetaminophen (Tylenol), Serum <10 (*)    All other components within normal limits  CBC WITH DIFFERENTIAL/PLATELET - Abnormal; Notable for the following:    Hemoglobin 10.0 (*)    HCT 31.6 (*)    MCV 70.2 (*)    MCH 22.2 (*)    RDW 16.6 (*)    All other components within normal limits  URINALYSIS, ROUTINE W REFLEX MICROSCOPIC - Abnormal; Notable for the following:    APPearance HAZY (*)    Hgb urine dipstick SMALL (*)    Protein, ur 30 (*)    Nitrite POSITIVE (*)    Leukocytes, UA TRACE (*)    Bacteria, UA MANY (*)    All other components within normal limits  URINE CULTURE  ETHANOL  SALICYLATE LEVEL  I-STAT TROPOININ, ED  I-STAT CG4 LACTIC ACID, ED     EKG  EKG Interpretation  Date/Time:  Saturday February 03 2017 14:45:44 EDT Ventricular Rate:  68 PR Interval:  178 QRS Duration: 70 QT Interval:  390 QTC Calculation: 414 R Axis:   -5 Text Interpretation:  Normal sinus rhythm Normal ECG No significant change since last tracing Confirmed by LITTLE MD, RACHEL [phone]) on 02/03/2017 4:01:31 PM         Radiology Dg Chest 2 View  Result Date: 02/03/2017 CLINICAL DATA:  Altered mental status EXAM: CHEST  2 VIEW COMPARISON:  08/30/2016 FINDINGS: Cardiomediastinal silhouette is stable. No infiltrate or pleural effusion. No pulmonary edema. Osteopenia and degenerative changes thoracic spine again noted. IMPRESSION: No active cardiopulmonary disease. Electronically Signed   By: Lahoma Crocker M.D.   On: 02/03/2017 15:12   Ct Head Wo Contrast  Result Date: 02/03/2017 CLINICAL DATA:   Altered mental status, hallucinations earlier today EXAM: CT HEAD WITHOUT CONTRAST TECHNIQUE: Contiguous axial images were obtained from the base of the skull through the vertex without intravenous contrast. COMPARISON:  11/24/2015 FINDINGS: Brain: No intracranial hemorrhage, mass effect or midline shift. No definite acute cortical infarction. No mass lesion is noted on this unenhanced scan. Stable atrophy and chronic white matter disease. No mass lesion is noted on this unenhanced scan. Ventricular size is stable from prior exam. Vascular: Atherosclerotic calcifications of carotid siphon again noted. Extensive atherosclerotic calcifications vertebral arteries. Skull: No skull fracture. Sinuses/Orbits: No acute findings Other: None IMPRESSION: No acute intracranial abnormality. Stable atrophy and chronic white matter disease. No definite acute cortical infarction. Electronically Signed   By: Lahoma Crocker M.D.   On: 02/03/2017 17:04    Procedures Procedures (including critical care time) Procedures  Medications Ordered in ED  Medications  cefTRIAXone (ROCEPHIN) 1 g in dextrose 5 % 50 mL IVPB (not administered)     Initial Impression / Assessment and Plan / ED Course  I have reviewed the triage vital signs and the nursing notes.  Pertinent labs & imaging results that were available during my care of the patient were reviewed by me and considered  in my medical decision making (see chart for details).    Pt brought in by EMS for report of AMS including hallucinations witnessed by family. On exam, the patient was awake and alert, comfortable with reassuring vital signs. She denied any complaints. She became confused during the neurologic exam and was not able to follow some of her instructions. She had no asymmetric weakness or obvious cranial nerve deficits. She did have some swelling of her left lower arm and hand that she stated she has had before. Cannot recall how long it is been there. It was  nontender.  Labwork shows normal lactate, reassuring CMP and CBC, hemoglobin is 10 which is stable from baseline anemia. Chest x-ray negative acute. UA is c/w infection, which may be driving the patient's delirium. His chest with the patient's daughter, Katy Apo, over the phone. She states that she has been confused and acting abnormally this week and she tried to get her to the doctor but was unable to do so. She denies any fevers or other specific symptoms. I have recommended admission to ensure that treating her infection clears her mental status. Gave a dose of ceftriaxone.Head CT negative for acute process.  Discussed admission with family medicine teaching service and patient admitted for further care.  Final Clinical Impressions(s) / ED Diagnoses   Final diagnoses:  Delirium  Hallucinations  Urinary tract infection without hematuria, site unspecified     New Prescriptions   No medications on file       Sharlett Iles, MD 02/03/17 1715

## 2017-02-03 NOTE — ED Notes (Signed)
In and out completed

## 2017-02-04 DIAGNOSIS — N3000 Acute cystitis without hematuria: Secondary | ICD-10-CM

## 2017-02-04 DIAGNOSIS — R410 Disorientation, unspecified: Secondary | ICD-10-CM

## 2017-02-04 LAB — CBC
HCT: 31 % — ABNORMAL LOW (ref 36.0–46.0)
HEMOGLOBIN: 9.7 g/dL — AB (ref 12.0–15.0)
MCH: 21.8 pg — AB (ref 26.0–34.0)
MCHC: 31.3 g/dL (ref 30.0–36.0)
MCV: 69.8 fL — ABNORMAL LOW (ref 78.0–100.0)
Platelets: 174 10*3/uL (ref 150–400)
RBC: 4.44 MIL/uL (ref 3.87–5.11)
RDW: 16.6 % — AB (ref 11.5–15.5)
WBC: 5.8 10*3/uL (ref 4.0–10.5)

## 2017-02-04 LAB — BASIC METABOLIC PANEL
Anion gap: 12 (ref 5–15)
BUN: 13 mg/dL (ref 6–20)
CALCIUM: 10.5 mg/dL — AB (ref 8.9–10.3)
CO2: 23 mmol/L (ref 22–32)
CREATININE: 0.83 mg/dL (ref 0.44–1.00)
Chloride: 103 mmol/L (ref 101–111)
GFR calc Af Amer: >60 mL/min (ref >60)
GFR, EST NON AFRICAN AMERICAN: 60 mL/min — AB (ref >60)
Glucose, Bld: 85 mg/dL (ref 65–99)
Potassium: 3.4 mmol/L — ABNORMAL LOW (ref 3.5–5.1)
SODIUM: 138 mmol/L (ref 135–145)

## 2017-02-04 MED ORDER — CEPHALEXIN 500 MG PO CAPS: 500.0000 mg | ORAL_CAPSULE | Freq: Two times a day (BID) | ORAL | Status: DC

## 2017-02-04 MED ORDER — POTASSIUM CHLORIDE CRYS ER 20 MEQ PO TBCR
40.0000 meq | EXTENDED_RELEASE_TABLET | Freq: Once | ORAL | Status: AC
  Administered 2017-02-04: 40 meq via ORAL
  Filled 2017-02-04: qty 2

## 2017-02-04 MED ORDER — ORAL CARE MOUTH RINSE
15.0000 mL | Freq: Two times a day (BID) | OROMUCOSAL | Status: DC
  Administered 2017-02-04 – 2017-02-06 (×6): 15 mL via OROMUCOSAL

## 2017-02-04 MED ORDER — ONDANSETRON HCL 4 MG PO TABS
4.0000 mg | ORAL_TABLET | Freq: Three times a day (TID) | ORAL | Status: DC | PRN
  Administered 2017-02-04: 4 mg via ORAL
  Filled 2017-02-04: qty 1

## 2017-02-04 MED ORDER — DEXTROSE 5 % IV SOLN
1.0000 g | INTRAVENOUS | Status: DC
  Administered 2017-02-04: 1 g via INTRAVENOUS
  Filled 2017-02-04 (×2): qty 10

## 2017-02-04 MED ORDER — APIXABAN 5 MG PO TABS
5.0000 mg | ORAL_TABLET | Freq: Two times a day (BID) | ORAL | Status: DC
  Administered 2017-02-04 – 2017-02-06 (×4): 5 mg via ORAL
  Filled 2017-02-04 (×4): qty 1

## 2017-02-04 NOTE — Progress Notes (Signed)
Pt vomited couple of times, MD texted for something for nausea.
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
## 2017-02-04 NOTE — Progress Notes (Addendum)
ANTICOAGULATION CONSULT NOTE - Initial Consult  Pharmacy Consult:  Eliquis Indication:  History of PE  Allergies  Allergen Reactions  . Tape Other (See Comments)    Skin is very thin and will tear and bruise easily!!  . Lisinopril Cough    Patient Measurements: Weight: 93 lb 11.1 oz (42.5 kg)  Vital Signs: Temp: 98.3 F (36.8 C) (03/25 0430) Temp Source: Oral (03/25 0430) BP: 154/68 (03/25 0430) Pulse Rate: 74 (03/25 0430)  Labs:  Recent Labs  02/03/17 1500 02/04/17 0438  HGB 10.0* 9.7*  HCT 31.6* 31.0*  PLT 177 174  CREATININE 0.94 0.83    Estimated Creatinine Clearance: 29.6 mL/min (by C-G formula based on SCr of 0.83 mg/dL).   Medical History: Past Medical History:  Diagnosis Date  . Allergy   . Anemia    Iron def anemia  . Arthritis   . Asthma   . Breast cancer ([place]) 10/2010   Invasive Ductal Carcinoma, s/p bilateral mastectomy, now on Tamoxifen. Followed by Dr Jamse Arn.   . Breast cancer ([place]) 2011   s/p Bilateral masectomy  . CAD (coronary artery disease)   . Carotid artery aneurysm ([place]) 08/2010   right ICA, 5 x 22mm  . CHF (congestive heart failure) (HCC)    Systolic   . Colon cancer ([place])   . Colon cancer ([place]) 12/11/2013  . DDD (degenerative disc disease), cervical   . Diabetes mellitus   . Diverticulosis   . GERD (gastroesophageal reflux disease)   . H/O: GI bleed   . Hiatal hernia   . History of colon cancer   . Hypercalcemia 06/09/2014  . Hyperlipidemia   . Hypertension   . Myocardial infarction   . Neuropathy ([place])   . Pneumonia 03/2012  . Shortness of breath      Assessment: 59 YOM with history of dementia presented with worsening mentation.  Pharmacy consulted to manage Eliquis for history of PE diagnosed in October of 2017.    Spoke to daughter, [name], who stated that patient was at the SNF for one month after hospital discharge.  Patient's other daughter, [name], stated that her mother is taking the Eliquis and her last dose was on  02/02/17. [facility]'s only [facility] is [facility] per daughter.  Confirmed with [facility] again that patient hasn't picked up any Eliquis and that the script is being held (date on prescription was 10/13/16).  Baseline labs reviewed.  Noted that she received Lovenox this AM.   Goal of Therapy:  Appropriate anticoagulation    Plan:  - D/C Lovenox - Eliquis 5mg  PO BID - Pharmacy will sign off and follow peripherally.  Thank you for the consult!    Loann Chahal D. Mina Marble, PharmD, BCPS Pager:  [phone] 02/04/2017, 9:59 AM

## 2017-02-04 NOTE — Progress Notes (Signed)
Family Medicine Teaching Service Daily Progress Note Intern Pager: [phone]  [name] Medical record number: [id number] Date of birth: 21-Jul-1924 Age: 83 y.o. Gender: female  Primary Care Provider: Kathrine Cords, MD Consultants: None  Code Status: Full   Pt Overview and Major Events to Date:    Assessment and Plan:  [name] is a 83 y.o. female presenting with decreased mentation per daughter x 1 week, lab findings suggestive of UTI. PMH is significant for dementia, hx of breast cancer and colon cancer, T2DM, HLD, PAD/CAD, neuropathy, HTN, GERD, OA, tobacco abuse (snuff), HFpEF (last echo 2016 with EF 50-55% and G1DD), hx of PE diagnosed 08/2016 and started on eliquis.   Resolved Altered mental status: Daughter called our clinic due to decreased mentation and increased confusion over the past week on with some decreased by mouth intake. Additionally, patient reports increased urinary frequency. Afebrile, chest x-ray without infiltrate, CT head no acute abnormality. Urine appears infected with nitrites, leukocytes, many bacteria and protein. Urine culture sent. Ceftriaxone started by ED M.D. No suprapubic tenderness or CVA tenderness on exam. Less concerned for pyelonephritis.  - CTX x 1 in ED - Monitor fever curve  UTI : Noted to be nauseated this AM, vomiting x 2. No WBC and afebrile  - Will continue Ceftriaxone 1 g for now  - Urine culture pending  - Continue to monitor fever curve   HTN: Patient with known hypertension, did not take any medications this morning. Restart home Norvasc, Coreg.  -Restart Norvasc, Coreg -Hold home Cozaar, do not want to drop blood pressure in the setting of UTI  History of PE: Patient was diagnosed with submassive PE in October 2017, and discharged on Eliquis to a SNIF. Patient denying any respiratory symptoms today, although per pharmacy review patient has never picked up Eliquis from her pharmacy. Patient cannot give any  history on her medications. - Restarted Eliquis per family has been on this outpatient  -Continue to monitor respiratory status - Hold plavix at discharge, no longer needed this medication   Left Hand Swelling: noted in the ED. Patient unable to give history regarding this. There is an IV on the same wrist; unsure if symptoms present prior to placement of IV. However, ED ordered upper extremity ultrasound negative for DVT  T2DM: Last hemoglobin A1c 08/2016 8.8. No oral or subcutaneous treatment for diabetes per chart review. Given advanced age, would not desire tight blood sugar control in this patient. -Daily CBGs on BMPs  HFpEF: last echo 2016 with EF 50-55% and G1DD. Clinically euvolemic on exam. -monitor fluid status on exam  HLD:  - continue Crestor  FEN/GI: Regular diet Prophylaxis: Lovenox  Disposition: Home   Subjective:  Patient doing well. States she is feeling much better. Denies any pain. Denies any nausea. Thought vomited x 2   Objective: Temp:  [98.3 F (36.8 C)-98.6 F (37 C)] 98.3 F (36.8 C) (03/25 0430) Pulse Rate:  [70-83] 74 (03/25 0430) Resp:  [9-21] 17 (03/25 0430) BP: (113-184)/(51-94) 154/68 (03/25 0430) SpO2:  [93 %-100 %] 99 % (03/25 0430) Weight:  [93 lb 11.1 oz (42.5 kg)-115 lb (52.2 kg)] 93 lb 11.1 oz (42.5 kg) (03/25 0019) Physical Exam: General: Lying in bed, NAD  Cardiovascular: RRR, no murmur  Respiratory: CTAB, no increase WOB Abdomen: BS+ , no ttp  Extremities: no lower extremity edema   Laboratory:  Recent Labs Lab 02/03/17 1500 02/04/17 0438  WBC 5.1 5.8  HGB 10.0* 9.7*  HCT  31.6* 31.0*  PLT 177 174    Recent Labs Lab 02/03/17 1500 02/04/17 0438  NA 140 138  K 3.5 3.4*  CL 108 103  CO2 24 23  BUN 15 13  CREATININE 0.94 0.83  CALCIUM 10.7* 10.5*  PROT 7.6  --   BILITOT 0.8  --   ALKPHOS 53  --   ALT 8*  --   AST 14*  --   GLUCOSE 122* 85     Imaging/Diagnostic Tests: Dg Chest 2 View  Result Date:  02/03/2017 CLINICAL DATA:  Altered mental status EXAM: CHEST  2 VIEW COMPARISON:  08/30/2016 FINDINGS: Cardiomediastinal silhouette is stable. No infiltrate or pleural effusion. No pulmonary edema. Osteopenia and degenerative changes thoracic spine again noted. IMPRESSION: No active cardiopulmonary disease. Electronically Signed   By: Lahoma Crocker M.D.   On: 02/03/2017 15:12   Ct Head Wo Contrast  Result Date: 02/03/2017 CLINICAL DATA:  Altered mental status, hallucinations earlier today EXAM: CT HEAD WITHOUT CONTRAST TECHNIQUE: Contiguous axial images were obtained from the base of the skull through the vertex without intravenous contrast. COMPARISON:  11/24/2015 FINDINGS: Brain: No intracranial hemorrhage, mass effect or midline shift. No definite acute cortical infarction. No mass lesion is noted on this unenhanced scan. Stable atrophy and chronic white matter disease. No mass lesion is noted on this unenhanced scan. Ventricular size is stable from prior exam. Vascular: Atherosclerotic calcifications of carotid siphon again noted. Extensive atherosclerotic calcifications vertebral arteries. Skull: No skull fracture. Sinuses/Orbits: No acute findings Other: None IMPRESSION: No acute intracranial abnormality. Stable atrophy and chronic white matter disease. No definite acute cortical infarction. Electronically Signed   By: Lahoma Crocker M.D.   On: 02/03/2017 17:04    Shyteria Lewis Cletis Media, MD 02/04/2017, 9:28 AM PGY-2, [place] Intern pager: [phone], text pages welcome

## 2017-02-05 LAB — URINE CULTURE
Culture: >=100000 — AB
Special Requests: NORMAL

## 2017-02-05 LAB — BASIC METABOLIC PANEL
ANION GAP: 9 (ref 5–15)
BUN: 13 mg/dL (ref 6–20)
CO2: 25 mmol/L (ref 22–32)
Calcium: 10.5 mg/dL — ABNORMAL HIGH (ref 8.9–10.3)
Chloride: 104 mmol/L (ref 101–111)
Creatinine, Ser: 0.85 mg/dL (ref 0.44–1.00)
GFR calc Af Amer: >60 mL/min (ref >60)
GFR calc non Af Amer: 58 mL/min — ABNORMAL LOW (ref >60)
GLUCOSE: 129 mg/dL — AB (ref 65–99)
POTASSIUM: 3.6 mmol/L (ref 3.5–5.1)
Sodium: 138 mmol/L (ref 135–145)

## 2017-02-05 LAB — CBC
HEMATOCRIT: 28.5 % — AB (ref 36.0–46.0)
Hemoglobin: 9 g/dL — ABNORMAL LOW (ref 12.0–15.0)
MCH: 22 pg — ABNORMAL LOW (ref 26.0–34.0)
MCHC: 31.6 g/dL (ref 30.0–36.0)
MCV: 69.5 fL — AB (ref 78.0–100.0)
PLATELETS: 175 10*3/uL (ref 150–400)
RBC: 4.1 MIL/uL (ref 3.87–5.11)
RDW: 16.2 % — ABNORMAL HIGH (ref 11.5–15.5)
WBC: 5.4 10*3/uL (ref 4.0–10.5)

## 2017-02-05 MED ORDER — FOSFOMYCIN TROMETHAMINE 3 G PO PACK
3.0000 g | PACK | Freq: Once | ORAL | Status: AC
  Administered 2017-02-05: 3 g via ORAL
  Filled 2017-02-05: qty 3

## 2017-02-05 MED ORDER — ENSURE ENLIVE PO LIQD
237.0000 mL | Freq: Two times a day (BID) | ORAL | Status: DC
  Administered 2017-02-06: 237 mL via ORAL

## 2017-02-05 NOTE — Progress Notes (Addendum)
Initial Nutrition Assessment  DOCUMENTATION CODES:   Severe malnutrition in context of acute illness/injury  INTERVENTION:    Ensure Enlive po BID, each supplement provides 350 kcal and 20 grams of protein  NUTRITION DIAGNOSIS:   Malnutrition related to acute illness as evidenced by 19%  percent weight loss, energy intake < or equal to 50% for > or equal to 5 days   GOAL:   Patient will meet greater than or equal to 90% of their needs  MONITOR:   PO intake, Supplement acceptance, Labs, Weight trends, I & O's  REASON FOR ASSESSMENT:   Malnutrition Screening Tool  ASSESSMENT:   83 y.o. Female presented with decreased mentation per daughter x 1 week, lab findings suggestive of UTI. PMH is significant for dementia, hx of breast cancer and colon cancer, T2DM, HLD, PAD/CAD, neuropathy, HTN, GERD, OA, tobacco abuse (snuff), HFpEF (last echo 2016 with EF 50-55% and G1DD), hx of PE diagnosed 08/2016 and started on eliquis.   RD spoke with daughter over telephone.  States pt has not been eating well for about 2 weeks when mental status changed. Was only eating "snack" type foods such as ice cream and cookies. Has had a 19% weight loss since beginning of February 2018.  Severe for time frame. PO intake here has been averaging < 50% per flowsheets. Has drank Ensure supplements in the past. Labs and medications reviewed.  Unable to complete Nutrition-Focused physical exam at this time.   Diet Order:  Diet regular Room service appropriate? Yes; Fluid consistency: Thin  Skin:  Reviewed, no issues  Last BM:  3/25  Height:   Ht Readings from Last 1 Encounters:  02/05/17 4\' 11"  (1.499 m)   Weight:   Wt Readings from Last 1 Encounters:  02/05/17 93 lb (42.2 kg)   Wt Readings from Last 10 Encounters:  02/05/17 93 lb (42.2 kg)  12/14/16 115 lb (52.2 kg)  09/29/16 108 lb 9.6 oz (49.3 kg)  09/25/16 109 lb 3.2 oz (49.5 kg)  09/18/16 109 lb 3.2 oz (49.5 kg)  09/12/16 108 lb (49  kg)  09/07/16 108 lb (49 kg)  09/04/16 107 lb 3.2 oz (48.6 kg)  08/24/16 111 lb (50.3 kg)  08/15/16 112 lb (50.8 kg)   Ideal Body Weight:  44.6 kg  BMI:  Body mass index is 18.78 kg/m.  Estimated Nutritional Needs:   Kcal:  1100-1300  Protein:  55-65 gm  Fluid:  >/= 1.5 L  EDUCATION NEEDS:   No education needs identified at this time  Arthur Holms, RD, LDN Pager #: [phone] After-Hours Pager #: [phone]

## 2017-02-05 NOTE — Evaluation (Signed)
Occupational Therapy Evaluation Patient Details Name: [name] MRN: [id number] DOB: Apr 03, 1924 Today's Date: 02/05/2017    History of Present Illness   83 y.o. female presenting with decreased mentation per daughter x 1 week, lab findings suggestive of UTI. PMH is significant for dementia, hx of breast cancer and colon cancer, T2DM, HLD, PAD/CAD, neuropathy, HTN, GERD, and OA. Admitted for altered mental status and UTI.    Clinical Impression   Pt reports that she lived with her daughter PTA and had an aide come each morning to A with ADLs. Currently, pt requires Max-total A for dressing and bathing. Pt Max A for squat pivot transfer to recliner and declined to use RW stating "every time they put me on that thing I am sore for two days". Pt's participation in ADLs and functional mobility due to increase pain. Pt would benefit from continues acute OT to increase occupational performance and participation. Recommend dc to SNF for further therapy to increase independence and safety prior to transitioning home.     Follow Up Recommendations  SNF;Supervision/Assistance - 24 hour    Equipment Recommendations  Other (comment) (Defer to next venue)    Recommendations for Other Services       Precautions / Restrictions Precautions Precautions: Fall Restrictions Weight Bearing Restrictions: No      Mobility Bed Mobility Overal bed mobility: Needs Assistance Bed Mobility: Supine to Sit     Supine to sit: Mod assist;HOB elevated        Transfers Overall transfer level: Needs assistance Equipment used: None Transfers: Squat Pivot Transfers     Squat pivot transfers: Max assist     General transfer comment: Pt required Max A to transfer from EOB to recliner.    Balance Overall balance assessment: Needs assistance Sitting-balance support: Bilateral upper extremity supported;Feet supported Sitting balance-Leahy Scale: Poor Sitting balance - Comments: Pt did demonstrated  poor sitting balance at EOB and required both hands on bed to maintain sitting balance   Standing balance support: Bilateral upper extremity supported Standing balance-Leahy Scale: Poor                             ADL either performed or assessed with clinical judgement   ADL Overall ADL's : Needs assistance/impaired Eating/Feeding: Set up;Sitting   Grooming: Wash/dry face;Set up;Sitting   Upper Body Bathing: Maximal assistance;Sitting   Lower Body Bathing: Total assistance;Sitting/lateral leans   Upper Body Dressing : Maximal assistance;Sitting   Lower Body Dressing: Total assistance;Sitting/lateral leans   Toilet Transfer: Squat-pivot;Maximal assistance (Simulated to recliner)   Toileting- Clothing Manipulation and Hygiene: Maximal assistance;Sitting/lateral lean       Functional mobility during ADLs: Total assistance (Declined to use RW to stand pivot to recliner) General ADL Comments: Pt has decreased occupational performance and partcipation. Requires A for majority of ADLs and states that she is in too much pain to move     Vision         Perception     Praxis      Pertinent Vitals/Pain Pain Assessment: Faces Faces Pain Scale: Hurts even more Pain Location: "All over" Pain Descriptors / Indicators: Discomfort;Constant;Grimacing;Moaning Pain Intervention(s): Limited activity within patient's tolerance     Hand Dominance Right   Extremity/Trunk Assessment Upper Extremity Assessment Upper Extremity Assessment: Generalized weakness   Lower Extremity Assessment Lower Extremity Assessment: Generalized weakness       Communication Communication Communication: No difficulties   Cognition Arousal/Alertness: Awake/alert Behavior  During Therapy: WFL for tasks assessed/performed Overall Cognitive Status: History of cognitive impairments - at baseline                                     General Comments  Wounds on B heels     Exercises     Shoulder Instructions      Home Living Family/patient expects to be discharged to:: Skilled nursing facility Living Arrangements: Children                               Additional Comments: Pt reports that an aide would come each morning and A with bathing and dressing. Pt reports that she was not very active PTA.       Prior Functioning/Environment Level of Independence: Needs assistance  Gait / Transfers Assistance Needed: Did not walk much at home. Used RW. ADL's / Homemaking Assistance Needed: Pt has aide come each morning for ADLs.            OT Problem List: Decreased strength;Decreased range of motion;Decreased activity tolerance;Impaired balance (sitting and/or standing);Decreased cognition;Decreased safety awareness;Decreased knowledge of use of DME or AE;Pain      OT Treatment/Interventions: Self-care/ADL training;Therapeutic exercise;Energy conservation;DME and/or AE instruction;Therapeutic activities;Patient/family education    OT Goals(Current goals can be found in the care plan section) Acute Rehab OT Goals Patient Stated Goal: "I am ready to go home. I am done here," OT Goal Formulation: With patient Time For Goal Achievement: 02/19/17 Potential to Achieve Goals: Good ADL Goals Pt Will Perform Grooming: with set-up;with supervision;sitting Pt Will Perform Upper Body Bathing: with min assist;sitting Pt Will Perform Upper Body Dressing: with min assist;sitting Pt Will Transfer to Toilet: with mod assist;bedside commode;stand pivot transfer  OT Frequency: Min 2X/week   Barriers to D/C:            Co-evaluation              End of Session Equipment Utilized During Treatment: Gait belt Nurse Communication: Mobility status  Activity Tolerance: Patient limited by pain Patient left: in chair;with call bell/phone within reach  OT Visit Diagnosis: Unsteadiness on feet (R26.81);Muscle weakness (generalized) (M62.81);Pain;Other  symptoms and signs involving cognitive function Pain - Right/Left:  (Generalized) Pain - part of body:  (Generalized)                Time: 8144-8185 OT Time Calculation (min): 22 min Charges:  OT General Charges $OT Visit: 1 Procedure OT Evaluation $OT Eval Moderate Complexity: 1 Procedure G-Codes:     Athalia, OTR/L [phone]  [place] 02/05/2017, 8:53 AM

## 2017-02-05 NOTE — Progress Notes (Signed)
Family Medicine Teaching Service Daily Progress Note Intern Pager: [phone]  [name] Medical record number: [id number] Date of birth: [birth year] Age: 83 y.o. Gender: female  Primary Care Provider: Kathrine Cords, MD Consultants: None  Code Status: Full   Pt Overview and Major Events to Date:   Assessment and Plan:  [name] is a 83 y.o. female presenting with decreased mentation per daughter x 1 week, lab findings suggestive of UTI. PMH is significant for dementia, hx of breast cancer and colon cancer, T2DM, HLD, PAD/CAD, neuropathy, HTN, GERD, OA, tobacco abuse (snuff), HFpEF (last echo 2016 with EF 50-55% and G1DD), hx of PE diagnosed 08/2016 and started on eliquis.   Resolved Altered mental status: Daughter called our clinic due to decreased mentation and increased confusion over the past week with some decreased by mouth intake. Additionally, patient reports increased urinary frequency. Afebrile, chest x-ray without infiltrate, CT head no acute abnormality. Urine appears infected with nitrites, leukocytes, many bacteria and protein. Urine culture sent. Ceftriaxone started by ED M.D. No suprapubic tenderness or CVA tenderness on exam. Less concerned for pyelonephritis.  - Monitor fever curve.  Patient has remained afebrile overnight.  -PT/OT >> OT rec SNF.  Patient agreeable but would like to think about it and states she would have to discuss with her daughter.    UTI : Urine culture >100,000 e.coli.  s/p CTX x 1 in ED.  Noted to be nauseated 3/25, vomiting x 2. No WBC and afebrile. Ceftriaxone 1 g continued and did not transition to po antibiotics for this reason.  Vomiting has since resolved and sensitivities returned.   -Fosfomycin x1 dose for treatment  - Continue to monitor fever curve   HTN: Patient with known hypertension.  Restarted home Norvasc, Coreg.  -Holding home Cozaar, do not want to drop blood pressure in the setting of UTI.  -BP this AM 143/51.    History of PE: Patient was diagnosed with submassive PE in October 2017, and discharged on Eliquis to a SNF. Patient denying any respiratory symptoms today, although per pharmacy review patient has never picked up Eliquis from her pharmacy. Patient cannot give any history on her medications. - Restarted Eliquis per family has been on this outpatient  -Continue to monitor respiratory status - Hold plavix at discharge, no longer needed this medication   Left Hand Swelling: noted in the ED. Patient unable to give history regarding this. There is an IV on the same wrist; unsure if symptoms present prior to placement of IV. However, ED ordered upper extremity ultrasound negative for DVT  T2DM: Last hemoglobin A1c 08/2016 8.8. No oral or subcutaneous treatment for diabetes per chart review. Given advanced age, would not desire tight blood sugar control in this patient. -Daily CBGs on BMPs  HFpEF: last echo 2016 with EF 50-55% and G1DD. Clinically euvolemic on exam. -monitor fluid status on exam  HLD:  - continue Crestor  FEN/GI: Regular diet Prophylaxis: Lovenox  Disposition: Stable for discharge.  Dispo pending SNF placement vs going home.  Patient would like to think about it and discuss with daughter today.   Subjective:  Patient doing well. States she is feeling much better. Denies any pain. Denies nausea, vomiting.   Objective: Temp:  [98.2 F (36.8 C)-99 F (37.2 C)] 99 F (37.2 C) (03/26 0544) Pulse Rate:  [69-85] 69 (03/26 0544) Resp:  [18] 18 (03/26 0544) BP: (130-143)/(51-70) 143/51 (03/26 0544) SpO2:  [97 %] 97 % (03/26 0544)  Physical Exam: General: Elderly 83 yr female sitting in hospital chair, in NAD  Cardiovascular: RRR, no MRG  Respiratory: CTAB, no increase WOB on RA  Abdomen: soft, NT, ND +bs Extremities: no lower extremity edema or tenderness noted    Laboratory:  Recent Labs Lab 02/03/17 1500 02/04/17 0438 02/05/17 0536  WBC 5.1 5.8 5.4  HGB  10.0* 9.7* 9.0*  HCT 31.6* 31.0* 28.5*  PLT 177 174 175    Recent Labs Lab 02/03/17 1500 02/04/17 0438 02/05/17 0536  NA 140 138 138  K 3.5 3.4* 3.6  CL 108 103 104  CO2 24 23 25   BUN 15 13 13   CREATININE 0.94 0.83 0.85  CALCIUM 10.7* 10.5* 10.5*  PROT 7.6  --   --   BILITOT 0.8  --   --   ALKPHOS 53  --   --   ALT 8*  --   --   AST 14*  --   --   GLUCOSE 122* 85 129*    Imaging/Diagnostic Tests: Dg Chest 2 View  Result Date: 02/03/2017 CLINICAL DATA:  Altered mental status EXAM: CHEST  2 VIEW COMPARISON:  08/30/2016 FINDINGS: Cardiomediastinal silhouette is stable. No infiltrate or pleural effusion. No pulmonary edema. Osteopenia and degenerative changes thoracic spine again noted. IMPRESSION: No active cardiopulmonary disease. Electronically Signed   By: Lahoma Crocker M.D.   On: 02/03/2017 15:12   Ct Head Wo Contrast  Result Date: 02/03/2017 CLINICAL DATA:  Altered mental status, hallucinations earlier today EXAM: CT HEAD WITHOUT CONTRAST TECHNIQUE: Contiguous axial images were obtained from the base of the skull through the vertex without intravenous contrast. COMPARISON:  11/24/2015 FINDINGS: Brain: No intracranial hemorrhage, mass effect or midline shift. No definite acute cortical infarction. No mass lesion is noted on this unenhanced scan. Stable atrophy and chronic white matter disease. No mass lesion is noted on this unenhanced scan. Ventricular size is stable from prior exam. Vascular: Atherosclerotic calcifications of carotid siphon again noted. Extensive atherosclerotic calcifications vertebral arteries. Skull: No skull fracture. Sinuses/Orbits: No acute findings Other: None IMPRESSION: No acute intracranial abnormality. Stable atrophy and chronic white matter disease. No definite acute cortical infarction. Electronically Signed   By: Lahoma Crocker M.D.   On: 02/03/2017 17:04    Lovenia Kim, MD 02/05/2017, 12:08 PM PGY-1, [place] Intern pager:  [phone], text pages welcome

## 2017-02-05 NOTE — Evaluation (Addendum)
Physical Therapy Evaluation Patient Details Name: [name] MRN: [id number] DOB: Jul 31, 1924 Today's Date: 02/05/2017   History of Present Illness  [name] a 83 y.o.femalepresenting with decreased mentation per daughter x 1 week, lab findings suggestive of UTI. PMH is significant for dementia, hx of breast cancer and colon cancer, T2DM, HLD, PAD/CAD,neuropathy, HTN, GERD, OA, tobacco abuse (snuff), HFpEF (last echo 2016 with EF 50-55% and G1DD), hx of PE diagnosed 08/2016 and started on eliquis.   Clinical Impression  Pt admitted with above diagnosis. Pt currently with functional limitations due to the deficits listed below (see PT Problem List). Pt needs mod to max  +2 assist with all mobility.  States daughter provides 24 hour care.  If she can't handle pt at home, will need SNF.  Will follow acutely.    Pt will benefit from skilled PT to increase their independence and safety with mobility to allow discharge to the venue listed below.      Follow Up Recommendations Supervision/Assistance - 24 hour;SNF (HHPT if daughter can A at home)    Equipment Recommendations  None recommended by PT    Recommendations for Other Services       Precautions / Restrictions Precautions Precautions: Fall Restrictions Weight Bearing Restrictions: No      Mobility  Bed Mobility Overal bed mobility: Needs Assistance Bed Mobility: Sit to Supine       Sit to supine: Max assist;+2 for physical assistance   General bed mobility comments: Pt was in chair on arrival.   Transfers Overall transfer level: Needs assistance Equipment used: Rolling walker (2 wheeled) Transfers: Sit to/from Stand Sit to Stand: Max assist;+2 physical assistance         General transfer comment: Needed max cues for foot and hand placement and assist to power up.   Ambulation/Gait Ambulation/Gait assistance: Mod assist;+2 physical assistance Ambulation Distance (Feet): 20 Feet Assistive device: Rolling  walker (2 wheeled) Gait Pattern/deviations: Step-through pattern;Decreased stride length;Decreased step length - right;Decreased step length - left;Drifts right/left;Trunk flexed   Gait velocity interpretation: Below normal speed for age/gender General Gait Details: Pt was able to ambulate with RW but needed max encouragement and assist to maintain upright posture as she has posterior lean.  Needed assist to move RW and cues to stay close to RW.  Needed cues to stand tall as well.   Stairs            Wheelchair Mobility    Modified Rankin (Stroke Patients Only)       Balance Overall balance assessment: Needs assistance;History of Falls Sitting-balance support: Bilateral upper extremity supported;Feet supported Sitting balance-Leahy Scale: Poor Sitting balance - Comments: Pt did demonstrated poor sitting balance at EOB and required both hands on bed to maintain sitting balance Postural control: Posterior lean Standing balance support: Bilateral upper extremity supported Standing balance-Leahy Scale: Poor Standing balance comment: relies on UE support                             Pertinent Vitals/Pain Pain Assessment: Faces Faces Pain Scale: Hurts even more Pain Location: "All over" Pain Descriptors / Indicators: Discomfort;Constant;Grimacing;Moaning Pain Intervention(s): Limited activity within patient's tolerance;Monitored during session;Repositioned  VSS  Home Living Family/patient expects to be discharged to:: Private residence Living Arrangements: Children Available Help at Discharge: Family;Available 24 hours/day (States daughter provides 24 hour care) Type of Home: House Home Access: Stairs to enter Entrance Stairs-Rails: Left Entrance Stairs-Number of Steps: 7  Home Layout: One level Home Equipment: Walker - 2 wheels;Bedside commode Additional Comments: Pt reports that an aide would come each morning and A with bathing and dressing. Pt reports that she  was not very active PTA.     Prior Function Level of Independence: Needs assistance   Gait / Transfers Assistance Needed: Did not walk much at home. Used RW.  ADL's / Homemaking Assistance Needed: Pt has aide come each morning for ADLs.  Comments: Pt states her daughter will help her all the time.      Hand Dominance   Dominant Hand: Right    Extremity/Trunk Assessment   Upper Extremity Assessment Upper Extremity Assessment: Defer to OT evaluation    Lower Extremity Assessment Lower Extremity Assessment: RLE deficits/detail;LLE deficits/detail RLE Deficits / Details: grossly 3-/5 LLE Deficits / Details: grossly 3-/5    Cervical / Trunk Assessment Cervical / Trunk Assessment: Kyphotic  Communication   Communication: No difficulties  Cognition Arousal/Alertness: Awake/alert Behavior During Therapy: WFL for tasks assessed/performed Overall Cognitive Status: History of cognitive impairments - at baseline                                        General Comments General comments (skin integrity, edema, etc.): Wounds on bil heels with dressings in place.     Exercises     Assessment/Plan    PT Assessment Patient needs continued PT services  PT Problem List Decreased activity tolerance;Decreased balance;Decreased mobility;Decreased knowledge of use of DME;Decreased safety awareness;Decreased knowledge of precautions;Decreased strength;Decreased cognition;Pain;Decreased skin integrity       PT Treatment Interventions DME instruction;Gait training;Functional mobility training;Therapeutic activities;Therapeutic exercise;Balance training;Patient/family education;Stair training    PT Goals (Current goals can be found in the Care Plan section)  Acute Rehab PT Goals Patient Stated Goal: to get home PT Goal Formulation: With patient Time For Goal Achievement: 02/19/17 Potential to Achieve Goals: Good    Frequency Min 3X/week   Barriers to discharge         Co-evaluation               End of Session Equipment Utilized During Treatment: Gait belt Activity Tolerance: Patient limited by fatigue;Patient limited by pain Patient left: in bed;with call bell/phone within reach;with bed alarm set Nurse Communication: Mobility status;Need for lift equipment Charlaine Dalton) PT Visit Diagnosis: Unsteadiness on feet (R26.81);Muscle weakness (generalized) (M62.81);Pain Pain - Right/Left:  (bil) Pain - part of body: Ankle and joints of foot;Leg    Time: 4128-7867 PT Time Calculation (min) (ACUTE ONLY): 23 min   Charges:   PT Evaluation $PT Eval Moderate Complexity: 1 Procedure PT Treatments $Gait Training: 8-22 mins   PT G Codes:        Zakayla Martinec,PT Acute Rehabilitation [phone] (pager)   Denice Paradise 02/05/2017, 12:56 PM

## 2017-02-06 DIAGNOSIS — E43 Unspecified severe protein-calorie malnutrition: Secondary | ICD-10-CM | POA: Insufficient documentation

## 2017-02-06 LAB — BASIC METABOLIC PANEL
Anion gap: 9 (ref 5–15)
BUN: 11 mg/dL (ref 6–20)
CO2: 24 mmol/L (ref 22–32)
Calcium: 10.5 mg/dL — ABNORMAL HIGH (ref 8.9–10.3)
Chloride: 105 mmol/L (ref 101–111)
Creatinine, Ser: 0.85 mg/dL (ref 0.44–1.00)
GFR calc Af Amer: >60 mL/min (ref >60)
GFR calc non Af Amer: 58 mL/min — ABNORMAL LOW (ref >60)
Glucose, Bld: 113 mg/dL — ABNORMAL HIGH (ref 65–99)
POTASSIUM: 3.4 mmol/L — AB (ref 3.5–5.1)
Sodium: 138 mmol/L (ref 135–145)

## 2017-02-06 LAB — CBC
HEMATOCRIT: 28.8 % — AB (ref 36.0–46.0)
HEMOGLOBIN: 9.1 g/dL — AB (ref 12.0–15.0)
MCH: 22.1 pg — ABNORMAL LOW (ref 26.0–34.0)
MCHC: 31.6 g/dL (ref 30.0–36.0)
MCV: 69.9 fL — ABNORMAL LOW (ref 78.0–100.0)
Platelets: 157 10*3/uL (ref 150–400)
RBC: 4.12 MIL/uL (ref 3.87–5.11)
RDW: 16.6 % — ABNORMAL HIGH (ref 11.5–15.5)
WBC: 5.3 10*3/uL (ref 4.0–10.5)

## 2017-02-06 MED ORDER — SENNOSIDES-DOCUSATE SODIUM 8.6-50 MG PO TABS
1.0000 | ORAL_TABLET | Freq: Once | ORAL | Status: AC
  Administered 2017-02-06: 1 via ORAL
  Filled 2017-02-06: qty 1

## 2017-02-06 MED ORDER — LOSARTAN POTASSIUM 50 MG PO TABS
50.0000 mg | ORAL_TABLET | Freq: Every day | ORAL | Status: DC
  Administered 2017-02-06: 50 mg via ORAL
  Filled 2017-02-06: qty 1

## 2017-02-06 MED ORDER — APIXABAN 5 MG PO TABS: 5.0000 mg | ORAL_TABLET | Freq: Two times a day (BID) | ORAL | 0 refills | Status: DC

## 2017-02-06 MED ORDER — POTASSIUM CHLORIDE CRYS ER 20 MEQ PO TBCR
40.0000 meq | EXTENDED_RELEASE_TABLET | Freq: Two times a day (BID) | ORAL | Status: DC
  Administered 2017-02-06: 40 meq via ORAL
  Filled 2017-02-06: qty 2

## 2017-02-06 MED ORDER — SENNOSIDES-DOCUSATE SODIUM 8.6-50 MG PO TABS: 20.0000 | ORAL_TABLET | Freq: Every evening | ORAL | 0 refills | Status: DC | PRN

## 2017-02-06 NOTE — Progress Notes (Signed)
Pt's daughter [name] came in. Discharge instructions given, verbalized understanding. Discharged to home accompanied by [name].

## 2017-02-06 NOTE — Consult Note (Signed)
[facility] CM Inpatient Consult   02/06/2017  [name] [birth year] [id number]   Patient was assessed for [place] [facility] for community services. Patient was previously active with [place] [facility].   Met with patient at bedside regarding being restarted with [facility] services. Chart reviewed and reveals the patient is a 83 yo F with history of dementia and a variety of other medical issues, with HX of HF, breast and colon cancer,  presenting with apparent altered mental status. Urine studies suggestive of UTI.  Patient is  from home with [facility] services and aide comes 2.5 hours each day, M-S from [place]. Lives in home with dtr, [name]. Gave permission to speak to dtr, [name], # [phone] is her first contact person.  Consent form signed on file and brochure with [place] [facility] information given.  Community notes reveals some difficulty maintaining contact with the patient.  Of note, [facility] Management services does not replace or interfere with any services that are arranged by inpatient case management or social work. For additional questions or referrals please contact:  Natividad Brood, RN BSN [facility] Liaison  [phone] business mobile phone Toll free office [phone]

## 2017-02-06 NOTE — Progress Notes (Signed)
Family Medicine Teaching Service Daily Progress Note Intern Pager: [phone]  [name] Medical record number: [id number] Date of birth: 12-01-1923 Age: 83 y.o. Gender: female  Primary Care Provider: Kathrine Cords, MD Consultants: None  Code Status: Full   Pt Overview and Major Events to Date:   Assessment and Plan: [name] is a 83 y.o. female presenting with decreased mentation per daughter x 1 week, lab findings suggestive of UTI. PMH is significant for dementia, hx of breast cancer and colon cancer, T2DM, HLD, PAD/CAD, neuropathy, HTN, GERD, OA, tobacco abuse (snuff), HFpEF (last echo 2016 with EF 50-55% and G1DD), hx of PE diagnosed 08/2016 and started on eliquis.   Altered mental status, resolved:   Likely due to UTI.  Urine with nitrites, leukocytes, many bacteria and protein.  -Monitor fever curve.  Patient has remained afebrile overnight.   -PT/OT >> rec SNF.  Home wheelchair ordered.   This morning states she would like to go home.  CM discussed with daughter and patient has a home aide 7 days a week for 2.5 hours a day.   -home with [facility] PT.  Face to face orders placed.   UTI : Urine culture grew >100,000 e.coli.  Patient has remained afebrile.   -Fosfomycin x1 dose for treatment on 3/26    HTN: Patient with known hypertension.  On home Norvasc, Coreg.  Cozaar was held in setting of possible hypotension in setting of UTI.   -Will restart home Cozaar as BP this AM 156/60.   Hypokalemia.  K+ 3.4 this morning. -Replete with 40 kdur x2  History of PE: Patient was diagnosed with submassive PE in October 2017, and discharged on Eliquis to a SNF. Patient denying any respiratory symptoms today, although per pharmacy review patient has never picked up Eliquis from her pharmacy. Patient cannot give any history on her medications. - Restarted Eliquis per family has been on this outpatient  - Continue to monitor respiratory status - Hold plavix at discharge, no  longer needed this medication   T2DM: Last hemoglobin A1c 08/2016 8.8%. No oral or subcutaneous treatment for diabetes per chart review. Given advanced age, would not desire tight blood sugar control in this patient. -Daily CBGs on BMPs    HFpEF: last echo 2016 with EF 50-55% and G1DD. Clinically euvolemic on exam. -monitor fluid status on exam  HLD:  - continue Crestor  FEN/GI: Regular diet Prophylaxis: Lovenox  Disposition: Stable for discharge home.   Subjective:  Patient doing well. She has no concerns at this time and is feeling better.  Denies nausea, vomiting.   Objective: Temp:  [98.6 F (37 C)-99 F (37.2 C)] 98.6 F (37 C) (03/27 0558) Pulse Rate:  [67-74] 67 (03/27 0558) Resp:  [18] 18 (03/27 0558) BP: (140-156)/(52-86) 156/60 (03/27 0558) SpO2:  [68 %-100 %] 100 % (03/27 0558) Weight:  [93 lb (42.2 kg)] 93 lb (42.2 kg) (03/26 1600)   Physical Exam: General: Elderly 83 yr female sitting in hospital chair, in no acute distress  Cardiovascular: RRR, no MRG noted on exam  Respiratory: CTAB, comfortable work of breathing  Abdomen: soft, NT, ND +bs Extremities: no lower extremity edema or tenderness noted  Psych: normal mood and affect   Laboratory:  Recent Labs Lab 02/04/17 0438 02/05/17 0536 02/06/17 0322  WBC 5.8 5.4 5.3  HGB 9.7* 9.0* 9.1*  HCT 31.0* 28.5* 28.8*  PLT 174 175 157    Recent Labs Lab 02/03/17 1500 02/04/17 0438 02/05/17 0536  02/06/17 0322  NA 140 138 138 138  K 3.5 3.4* 3.6 3.4*  CL 108 103 104 105  CO2 24 23 25 24   BUN 15 13 13 11   CREATININE 0.94 0.83 0.85 0.85  CALCIUM 10.7* 10.5* 10.5* 10.5*  PROT 7.6  --   --   --   BILITOT 0.8  --   --   --   ALKPHOS 53  --   --   --   ALT 8*  --   --   --   AST 14*  --   --   --   GLUCOSE 122* 85 129* 113*    Imaging/Diagnostic Tests: No results found.  Lovenia Kim, MD 02/06/2017, 12:24 PM PGY-1, [place] Intern pager: [phone], text pages  welcome

## 2017-02-06 NOTE — Care Management Important Message (Signed)
Important Message  Patient Details  Name: [name] MRN: [id number] Date of Birth: 01/09/1924   Medicare Important Message Given:  Yes    Erenest Rasher, RN 02/06/2017, 10:20 AM

## 2017-02-06 NOTE — Care Management Note (Addendum)
Case Management Note  Patient Details  Name: [name] MRN: [id number] Date of Birth: 08-08-1924  Subjective/Objective:  AMS, UTI                  Action/Plan: Discharge Planning: NCM spoke to pt and caregiver, [name] in room. Caregiver states she is with [place] [facility] and aide comes 2.5 hours each day, [name] from [place]. Lives in home with dtr, [name]. Gave permission to speak to dtr, [name], # [phone]. Contacted dtr and offered choice for HH/list provided. Pt had [facility] in the past. Contacted [facility] [facility] with new referral. Pt has RW but requesting light weight manual wheelchair. Contacted [facility] DME rep for wheelchair. Consult to [facility]. Contacted attending for HHPT/OT with F2F orders.   PCP [name], [name]   Received call from [facility] DME rep and pt will have to pick up wheelchair at retail store due to pt'[name] height and size.   Expected Discharge Date:  02/09/17               Expected Discharge Plan:  [place]  In-House Referral:  NA  Discharge planning Services  CM Consult  Post Acute Care Choice:  Home Health Choice offered to:  Adult Children  DME Arranged:  Youth worker wheelchair with seat cushion DME Agency:  [place]:  PT, OT [place] Agency:  [facility]  Status of Service:  Completed, signed off  If discussed at [employer] of Stay Meetings, dates discussed:    Additional Comments:  Erenest Rasher, RN 02/06/2017, 10:45 AM

## 2017-02-06 NOTE — Discharge Summary (Signed)
[facility] Discharge Summary  [name] Medical record number: [id number] Date of birth: [birth year] Age: 83 y.o. Gender: female Date of Admission: 02/03/2017  Date of Discharge: 02/06/2017 Admitting Physician: Leeanne Rio, MD  Primary Care Provider: Kathrine Cords, MD Consultants: None  Indication for Hospitalization: decreased mentation/altered mental status   Discharge Diagnoses/Problem List:  Altered mental status UTI HTN History of PE T2DM HFpEF HLD  Disposition: Home  Discharge Condition: Stable, improved  Discharge Exam:  General: Elderly 83 yr female sitting in hospital chair, in no acute distress  Cardiovascular: RRR, no MRG noted on exam  Respiratory: CTAB, comfortable work of breathing  Abdomen: soft, NT, ND +bs Extremities: no lower extremity edema or tenderness noted  Psych: normal mood and affect   Brief Hospital Course:  Patient is a 83 yo female who presented with decreased mentation per daughter x1 week.  On arrival, UA suggestive of UTI.  She was given CTX in the ED.  CXR without infiltrate and head CT negative. Admitted to FPTS and antibiotics continued.  Ucx returned with >100,000 e coli.  She remained afebrile and mental status improved over the course of the next day.  Fosfomycin x1 given for UTI treatment once culture and sensitivities resulted.  Her home antihypertensives were continued during admission except Cozaar as did not want blood pressures to drop in setting of UTI.  Her blood pressures remained normotensive to high and Cozaar added back which improved pressures.  PT OT seen and recommended SNF.  Patient offered SNF however wished to return home as she has a home aide who comes 7 days a week for 2.5 hours, and she lives with one of her daughters. Stable at time of discharge and patient was back at her baseline.   Of note, patient diagnosed with PE in Oct 2017 and discharged to a SNF.  Per  pharmacy review, patient had never picked up Eliquis from pharmacy.  Eliquis was continued on this admission and she was discharged on it as well.   Issues for Follow Up:  1. Patient not discharged home with antibiotics for UTI because given fosfomycin x1 in hospital prior to discharge.  2. Supposed to be taking Eliquis since found to have PE 08/2016 however per pharmacy, was never picked up.  Please make sure she is taking this.  3. Home wheelchair recommended by PT and ordered.   4. Check BMET at follow up to make sure electrolytes stable.   Significant Procedures: None  Significant Labs and Imaging:   Recent Labs Lab 02/04/17 0438 02/05/17 0536 02/06/17 0322  WBC 5.8 5.4 5.3  HGB 9.7* 9.0* 9.1*  HCT 31.0* 28.5* 28.8*  PLT 174 175 157    Recent Labs Lab 02/03/17 1500 02/04/17 0438 02/05/17 0536 02/06/17 0322  NA 140 138 138 138  K 3.5 3.4* 3.6 3.4*  CL 108 103 104 105  CO2 _0 GLUCOSE 122* 85 129* 113*  BUN _1 CREATININE 0.94 0.83 0.85 0.85  CALCIUM 10.7* 10.5* 10.5* 10.5*  ALKPHOS 53  --   --   --   AST 14*  --   --   --   ALT 8*  --   --   --   ALBUMIN 3.0*  --   --   --    Results/Tests Pending at Time of Discharge: None  Discharge Medications:  Allergies as of 02/06/2017      Reactions  Tape Other (See Comments)   Skin is very thin and will tear and bruise easily!!   Lisinopril Cough      Medication List    STOP taking these medications   gabapentin 300 MG capsule Commonly known as:  NEURONTIN   ranitidine 150 MG capsule Commonly known as:  ZANTAC     TAKE these medications   acetaminophen 500 MG tablet Commonly known as:  TYLENOL Take 500 mg by mouth every 6 (six) hours as needed for moderate pain.   albuterol 108 (90 Base) MCG/ACT inhaler Commonly known as:  PROVENTIL HFA;VENTOLIN HFA Inhale 2 puffs into the lungs every 6 (six) hours as needed. For shortness of breath What changed:  reasons to take this  additional  instructions   amLODipine 5 MG tablet Commonly known as:  NORVASC Take 1 tablet (5 mg total) by mouth daily.   apixaban 5 MG Tabs tablet Commonly known as:  ELIQUIS Take 1 tablet (5 mg total) by mouth 2 (two) times daily.   ASPIRIN CHILDRENS 81 MG chewable tablet Generic drug:  aspirin Chew 81 mg by mouth daily.   BIOFREEZE EX Apply 1 application topically 2 (two) times daily as needed (pain).   Blood Pressure Kit Use the blood pressure kit to check you blood pressure twice a day   capsaicin 0.025 % cream Commonly known as:  ZOSTRIX APPLY TO AFFECTED AREA TWICE A DAY   carvedilol 12.5 MG tablet Commonly known as:  COREG TAKE 1 TABLET (12.5 MG TOTAL) BY MOUTH 2 (TWO) TIMES DAILY WITH A MEAL.   clopidogrel 75 MG tablet Commonly known as:  PLAVIX Take 75 mg by mouth daily.   diclofenac sodium 1 % Gel Commonly known as:  VOLTAREN APPLY 2 grams topically 4 TIMES DAILY AS NEEDED FOR PAIN   docusate sodium 100 MG capsule Commonly known as:  COLACE Take 1 capsule (100 mg total) by mouth daily. What changed:  Another medication with the same name was removed. Continue taking this medication, and follow the directions you see here.   feeding supplement (PRO-STAT SUGAR FREE 64) Liqd Take 30 mLs by mouth 2 (two) times daily with a meal.   ferrous sulfate 325 (65 FE) MG tablet Take 1 tablet (325 mg total) by mouth daily with breakfast.   glucose blood test strip Use as instructed Dx. Code 250.02   guaifenesin 100 MG/5ML syrup Commonly known as:  ROBITUSSIN Take 10 mLs (200 mg total) by mouth 3 (three) times daily as needed for cough.   loratadine 10 MG tablet Commonly known as:  CLARITIN TAKE 1/2 TABLET BY MOUTH DAILY   losartan 50 MG tablet Commonly known as:  COZAAR TAKE 1 TABLET (50 MG TOTAL) BY MOUTH AT BEDTIME.   PATADAY 0.2 % Soln Generic drug:  Olopatadine HCl APPLY 1 DROP TO EYE DAILY. What changed:  See the new instructions.   rosuvastatin 20 MG  tablet Commonly known as:  CRESTOR Take 1 tablet (20 mg total) by mouth at bedtime.   senna-docusate 8.6-50 MG tablet Commonly known as:  Senokot-S Take 20 tablets by mouth at bedtime as needed for mild constipation.   traMADol 50 MG tablet Commonly known as:  ULTRAM Take 1 tablet (50 mg total) by mouth every 8 (eight) hours as needed for severe pain.      Discharge Instructions: Please refer to Patient Instructions section of EMR for full details.  Patient was counseled important signs and symptoms that should prompt return to medical care,  changes in medications, dietary instructions, activity restrictions, and follow up appointments.   Follow-Up Appointments: Follow-up Information    [facility] Follow up.   Specialty:  Home Health Services Why:  Home Health Physical Therapy and Occupational Therapy- will contact you to arrange initial visit Contact information: [address] [place] [address] [phone]        Adin Hector, MD. Go on 02/07/2017.   Specialty:  Family Medicine Why:  Appointment at [facility]. Please arrive by 2:45 PM.  Contact information: [place] [address] [phone]        [facility] Follow up.   Why:  please go by retail store to pick up wheelchair Contact information: [address] [address] [phone]          Lovenia Kim, MD 02/06/2017, 10:00 PM PGY-1, [place]

## 2017-02-07 ENCOUNTER — Encounter: Payer: Self-pay | Admitting: Internal Medicine

## 2017-02-07 ENCOUNTER — Ambulatory Visit (INDEPENDENT_AMBULATORY_CARE_PROVIDER_SITE_OTHER): Payer: Medicare Other | Admitting: Internal Medicine

## 2017-02-07 VITALS — BP 104/50 | HR 90 | Temp 98.4°F | Ht 59.0 in | Wt 111.6 lb

## 2017-02-07 DIAGNOSIS — N3000 Acute cystitis without hematuria: Secondary | ICD-10-CM | POA: Diagnosis not present

## 2017-02-07 DIAGNOSIS — I2782 Chronic pulmonary embolism: Secondary | ICD-10-CM

## 2017-02-07 DIAGNOSIS — I10 Essential (primary) hypertension: Secondary | ICD-10-CM | POA: Diagnosis not present

## 2017-02-07 NOTE — Patient Instructions (Addendum)
It was nice seeing you today Ms. Azure!  Please make sure you are taking Eliquis (apixaban) every single day to prevent another blood clot in your lungs.   Please make sure you are NOT taking gabapentin (Neurontin) or ranitidine (Zantac) anymore.   For your heel pain, you can buy bunion pads at the drugstore or a medical home supply store.   If you have any questions or concerns, please feel free to call the clinic.   Be well,  Dr. Avon Gully

## 2017-02-07 NOTE — Progress Notes (Signed)
   Subjective:   Patient: [name]       Birthdate: October 21, 1924       MRN: [id number]      HPI  [name] is a 83 y.o. female presenting for hospital follow-up.   Patient was admitted from 3/24-3/27. She presented with AMS and was found to have UTI. After antibiotic treatment was started, mental status gradually improved. It was recommended by PT/OT during admission that patient be discharged to SNF, however patient declined. She was discharged after return to baseline.  Since discharge yesterday, patient reports that she is doing very well. She complains of R heel pain but says this is not new. She denis any abdominal pain, nausea, vomiting, dysuria. She denies any breathing difficulties. Her niece is present and says that patient's daughter who administers her medication picked up the Eliquis yesterday and should be giving it to her today.   Smoking status reviewed. Patient is never smoker.   Review of Systems See HPI.     Objective:  Physical Exam  Constitutional: She is oriented to person, place, and time.  Pleasant elderly female sitting in wheelchair in NAD  HENT:  Head: Normocephalic and atraumatic.  Mouth/Throat: Oropharynx is clear and moist. No oropharyngeal exudate.  Eyes: Conjunctivae and EOM are normal. Pupils are equal, round, and reactive to light. Right eye exhibits no discharge. Left eye exhibits no discharge.  Cardiovascular: Normal rate, regular rhythm and normal heart sounds.   No murmur heard. Pulmonary/Chest: Effort normal and breath sounds normal. No respiratory distress. She has no wheezes.  Abdominal: Soft. Bowel sounds are normal. She exhibits no distension. There is no tenderness. There is no rebound and no guarding.  Neurological: She is alert and oriented to person, place, and time.  Skin: Skin is warm and dry.  Psychiatric: Affect and judgment normal.      Assessment & Plan:  UTI (urinary tract infection) Doing well since discharge. Completed  abx course during admission. Denying symptoms. Mentating appropriately.  - F/u in one month with PCP  Pulmonary embolus [facility]) Reminded patient again that she should be taking Eliquis twice a day. Patient's niece confirmed that patient's daughter, who administers her medications, picked up Eliquis prescription yesterday and will be giving it to patient today.    Adin Hector, MD, MPH PGY-2 [place] [facility] Pager [phone]

## 2017-02-07 NOTE — Assessment & Plan Note (Signed)
Reminded patient again that she should be taking Eliquis twice a day. Patient's niece confirmed that patient's daughter, who administers her medications, picked up Eliquis prescription yesterday and will be giving it to patient today.

## 2017-02-07 NOTE — Assessment & Plan Note (Signed)
Doing well since discharge. Completed abx course during admission. Denying symptoms. Mentating appropriately.  - F/u in one month with PCP

## 2017-02-08 ENCOUNTER — Other Ambulatory Visit: Payer: Self-pay | Admitting: Family Medicine

## 2017-02-08 ENCOUNTER — Other Ambulatory Visit: Payer: Self-pay

## 2017-02-08 LAB — BASIC METABOLIC PANEL
BUN / CREAT RATIO: 18 (ref 12–28)
BUN: 18 mg/dL (ref 10–36)
CALCIUM: 10.8 mg/dL — AB (ref 8.7–10.3)
CO2: 24 mmol/L (ref 18–29)
CREATININE: 1 mg/dL (ref 0.57–1.00)
Chloride: 103 mmol/L (ref 96–106)
GFR, EST AFRICAN AMERICAN: 57 mL/min/{1.73_m2} — AB (ref >59)
GFR, EST NON AFRICAN AMERICAN: 49 mL/min/{1.73_m2} — AB (ref >59)
Glucose: 254 mg/dL — ABNORMAL HIGH (ref 65–99)
Potassium: 4.8 mmol/L (ref 3.5–5.2)
Sodium: 138 mmol/L (ref 134–144)

## 2017-02-08 NOTE — Patient Outreach (Signed)
[facility]) Care Management  02/08/17  [name] Jan 12, 1924 [id number]  Successful outreach completed with patient's daugter, [name] for transition of care. Patient identification verified.  At time of call, [name] was not present with patient at her home.  She reported that they were given discharge instructions and do not have any questions.  [name] reported that patient was admitted with confusion related to a UTI. Se reported that the confusion is much better now. She stated that she is closer to her baseline and is progressing well.  She reported patient has follow up appointments scheduled and that family will assist with transportation. Patient lives with her other daughter who also provides care and assistance. She also has personal care services that comes in daily for 2-3 hours and assists with bathing, dressing, and cooking. She reported that the patient requires 24 hour supervision and they are providing that.  She stated that patient has a walker, bedside commode and a blood pressure cuff.  [name] stated that patient is to have home health, but was unable to confirm if they had visited yet or called.   [name] was grateful for call, but currently does not have any questions or concerns. She denies any needs for her mother at present. Will continue to follow patient for [name] and will call her other sister to schedule a home visit within next couple of weeks.  RNCM contact information provided and 24 hour nurse line information provided. Encouraged to call with any needs or concerns.  [name], RN, BSN, [place] Management Coordinator [phone]

## 2017-02-15 DIAGNOSIS — M5030 Other cervical disc degeneration, unspecified cervical region: Secondary | ICD-10-CM | POA: Diagnosis not present

## 2017-02-15 DIAGNOSIS — E1140 Type 2 diabetes mellitus with diabetic neuropathy, unspecified: Secondary | ICD-10-CM | POA: Diagnosis not present

## 2017-02-15 DIAGNOSIS — E1151 Type 2 diabetes mellitus with diabetic peripheral angiopathy without gangrene: Secondary | ICD-10-CM | POA: Diagnosis not present

## 2017-02-15 DIAGNOSIS — F0390 Unspecified dementia without behavioral disturbance: Secondary | ICD-10-CM | POA: Diagnosis not present

## 2017-02-15 DIAGNOSIS — Z87440 Personal history of urinary (tract) infections: Secondary | ICD-10-CM | POA: Diagnosis not present

## 2017-02-15 DIAGNOSIS — M159 Polyosteoarthritis, unspecified: Secondary | ICD-10-CM | POA: Diagnosis not present

## 2017-02-20 ENCOUNTER — Telehealth: Payer: Self-pay | Admitting: Family Medicine

## 2017-02-20 NOTE — Telephone Encounter (Signed)
Attempted to call back, no answer.  I signed a form this AM for PT so hopefully that will suffice.  [name], MD [facility] Family Medicine Resident  02/20/2017, 3:52 PM

## 2017-02-20 NOTE — Telephone Encounter (Signed)
Will forward to MD to give ok for verbal orders. Nayana Lenig,CMA  

## 2017-02-20 NOTE — Telephone Encounter (Signed)
Charice would like verbal orders for PT once a week for one week then twice a week for four weeks. ep

## 2017-02-22 DIAGNOSIS — F0390 Unspecified dementia without behavioral disturbance: Secondary | ICD-10-CM | POA: Diagnosis not present

## 2017-02-22 DIAGNOSIS — E1151 Type 2 diabetes mellitus with diabetic peripheral angiopathy without gangrene: Secondary | ICD-10-CM | POA: Diagnosis not present

## 2017-02-22 DIAGNOSIS — Z87440 Personal history of urinary (tract) infections: Secondary | ICD-10-CM | POA: Diagnosis not present

## 2017-02-22 DIAGNOSIS — M159 Polyosteoarthritis, unspecified: Secondary | ICD-10-CM | POA: Diagnosis not present

## 2017-02-22 DIAGNOSIS — M5030 Other cervical disc degeneration, unspecified cervical region: Secondary | ICD-10-CM | POA: Diagnosis not present

## 2017-02-22 DIAGNOSIS — E1140 Type 2 diabetes mellitus with diabetic neuropathy, unspecified: Secondary | ICD-10-CM | POA: Diagnosis not present

## 2017-02-27 DIAGNOSIS — E1140 Type 2 diabetes mellitus with diabetic neuropathy, unspecified: Secondary | ICD-10-CM | POA: Diagnosis not present

## 2017-02-27 DIAGNOSIS — Z87440 Personal history of urinary (tract) infections: Secondary | ICD-10-CM | POA: Diagnosis not present

## 2017-02-27 DIAGNOSIS — E1151 Type 2 diabetes mellitus with diabetic peripheral angiopathy without gangrene: Secondary | ICD-10-CM | POA: Diagnosis not present

## 2017-02-27 DIAGNOSIS — M5030 Other cervical disc degeneration, unspecified cervical region: Secondary | ICD-10-CM | POA: Diagnosis not present

## 2017-02-27 DIAGNOSIS — F0390 Unspecified dementia without behavioral disturbance: Secondary | ICD-10-CM | POA: Diagnosis not present

## 2017-02-27 DIAGNOSIS — M159 Polyosteoarthritis, unspecified: Secondary | ICD-10-CM | POA: Diagnosis not present

## 2017-02-28 ENCOUNTER — Telehealth: Payer: Self-pay | Admitting: *Deleted

## 2017-02-28 DIAGNOSIS — M546 Pain in thoracic spine: Secondary | ICD-10-CM

## 2017-02-28 NOTE — Telephone Encounter (Signed)
Pt daughter is in office today and stated that pt needs a refill on her tramadol.  Please let pt know when ready. Katharina Caper, April D, [place]

## 2017-03-01 ENCOUNTER — Other Ambulatory Visit: Payer: Self-pay | Admitting: Family Medicine

## 2017-03-01 DIAGNOSIS — E1140 Type 2 diabetes mellitus with diabetic neuropathy, unspecified: Secondary | ICD-10-CM | POA: Diagnosis not present

## 2017-03-01 DIAGNOSIS — M159 Polyosteoarthritis, unspecified: Secondary | ICD-10-CM | POA: Diagnosis not present

## 2017-03-01 DIAGNOSIS — F0390 Unspecified dementia without behavioral disturbance: Secondary | ICD-10-CM | POA: Diagnosis not present

## 2017-03-01 DIAGNOSIS — Z87440 Personal history of urinary (tract) infections: Secondary | ICD-10-CM | POA: Diagnosis not present

## 2017-03-01 DIAGNOSIS — E1151 Type 2 diabetes mellitus with diabetic peripheral angiopathy without gangrene: Secondary | ICD-10-CM | POA: Diagnosis not present

## 2017-03-01 DIAGNOSIS — M5030 Other cervical disc degeneration, unspecified cervical region: Secondary | ICD-10-CM | POA: Diagnosis not present

## 2017-03-02 ENCOUNTER — Other Ambulatory Visit: Payer: Self-pay

## 2017-03-02 MED ORDER — TRAMADOL HCL 50 MG PO TABS: 50.0000 mg | ORAL_TABLET | Freq: Three times a day (TID) | ORAL | 0 refills | Status: DC | PRN

## 2017-03-02 NOTE — Telephone Encounter (Signed)
On calling pharmacy, pt just had 60 filled by Dr. Lincoln Brigham 4 days ago.  Will not refill.  [name], MD [facility] Family Medicine Resident  03/02/2017, 9:21 AM

## 2017-03-02 NOTE — Patient Outreach (Signed)
[place] [facility]) Care Management  03/02/17  SHARUNDA SALMON 13-Jul-1924 [id number]  Attempted to reach patient's daughter, [name] (who lives with her) to follow up on patient and schedule home visit. Left HIPAA compliant voicemail with RNCM contact information and invited callback.  [place] R. Caydee Talkington, RN, BSN, [place] Management Coordinator [phone]

## 2017-03-05 ENCOUNTER — Other Ambulatory Visit: Payer: Self-pay

## 2017-03-05 NOTE — Patient Outreach (Signed)
[name] [facility]) Care Management  03/05/17  [name] 1924/04/23 [id number]  Attempted to reach patient's daughter, [name] without success at [phone]. Left HIPAA compliant voicemail with RNCM contact information and invited callback.  Successful outreach completed with patient's daughter [name]. She verified that [name] is living with patient. She suggested that if the [facility] does not receive a callback from her to send an letter with the appointment date/time and let her decide if she is available for a visit. RNCM advised that she will give [name] 24 hours to respond and if she does not callback, will send her a letter.  [name] reported that patient is doing good. She stated that she was with her on this past Saturday and she came out to the porch to sit with them. She stated she was not aware of any problems or concerns.  Plan: RNCM will attempt to reach [facility] to schedule home visit via letter if she does not respond to call today.  [place] R. Ronnel Zuercher, RN, BSN, [place] Management Coordinator [phone]

## 2017-03-07 NOTE — Telephone Encounter (Signed)
Returned call, no answer, if patient calls back please inform her of message from MD.

## 2017-03-12 ENCOUNTER — Ambulatory Visit (INDEPENDENT_AMBULATORY_CARE_PROVIDER_SITE_OTHER): Payer: Medicare Other | Admitting: Internal Medicine

## 2017-03-12 ENCOUNTER — Encounter: Payer: Self-pay | Admitting: Internal Medicine

## 2017-03-12 DIAGNOSIS — R059 Cough, unspecified: Secondary | ICD-10-CM

## 2017-03-12 DIAGNOSIS — R05 Cough: Secondary | ICD-10-CM

## 2017-03-12 MED ORDER — BENZONATATE 200 MG PO CAPS: 200.0000 mg | ORAL_CAPSULE | Freq: Three times a day (TID) | ORAL | 0 refills | Status: DC | PRN

## 2017-03-12 MED ORDER — AZITHROMYCIN 500 MG PO TABS: 500.0000 mg | ORAL_TABLET | Freq: Every day | ORAL | 0 refills | Status: DC

## 2017-03-12 NOTE — Progress Notes (Signed)
   Subjective:   Patient: [name]       Birthdate: 1924/04/02       MRN: [id number]      HPI  LACINDA CURVIN is a 83 y.o. female presenting for cough.   Cough Began three days ago. Two family members have had similar symptoms. Reports cough is productive of clear sputum. Denies blood in sputum. Reports some difficulty breathing last night, but a menthol rub around her nose improved her symptoms. Has not used any other medication. Subjective fever, for which she took Tylenol. Appetite has been normal. Has had a runny nose as well. Denies sore throat.   Smoking status reviewed. Patient is never smoker but has history of chewing tobacco use.   Review of Systems See HPI.     Objective:  Physical Exam  Constitutional: She is oriented to person, place, and time and well-developed, well-nourished, and in no distress.  HENT:  Head: Normocephalic and atraumatic.  Mouth/Throat: Oropharynx is clear and moist.  Eyes: Conjunctivae and EOM are normal. Right eye exhibits no discharge. Left eye exhibits no discharge.  Cardiovascular: Normal rate, regular rhythm and normal heart sounds.   No murmur heard. Pulmonary/Chest:  Crackles heard in R lung base. Good air movement. No wheezes. Normal WOB on RA. Speaking in full sentences without difficulty.   Musculoskeletal:  Trace to 1+ LE edema  Neurological: She is alert and oriented to person, place, and time.  Skin: Skin is warm and dry.  Psychiatric: Affect and judgment normal.      Assessment & Plan:  Cough CAP vs viral etiology. Crackles in R lung base concerning for CAP, so will treat. Patient does have history of CHF, which is included in differential, however minimal LE edema. Patient's two sick contacts with similar cough also more suggestive of infectious etiology vs CHF. Will begin antibiotics and symptomatic treatment with Tessalon perles, however patient given strict return precautions to schedule another appt if not improved when  antibiotics course completed.  - Azithro 500mg  qd x3d - Tessalon perles TID PRN - Return if no improvement in four days   Adin Hector, MD, MPH PGY-2 [facility] Pager [phone]

## 2017-03-12 NOTE — Assessment & Plan Note (Signed)
CAP vs viral etiology. Crackles in R lung base concerning for CAP, so will treat. Patient does have history of CHF, which is included in differential, however minimal LE edema. Patient's two sick contacts with similar cough also more suggestive of infectious etiology vs CHF. Will begin antibiotics and symptomatic treatment with Tessalon perles, however patient given strict return precautions to schedule another appt if not improved when antibiotics course completed.  - Azithro 500mg  qd x3d - Tessalon perles TID PRN - Return if no improvement in four days

## 2017-03-12 NOTE — Patient Instructions (Addendum)
It was nice seeing you again today Ms. Broyles!  Please begin taking the antibiotic (azithromycin) one tablet once a day for the next three days.   For your cough, you can take one Tesslon perle up to every 8 hours as needed. You can also try drinking a warm beverage with a teaspoonful of honey mixed into it 3-4 times per day.   For your heel pain, you can purchase [facility] or another muscle rub from the drugstore. You can also try a capsaicin cream from the drugstore. If your pain is not improving at all, please let us know.   Please schedule an appointment at your earliest convenience to go over your medications. Make sure your daughter comes with you and brings all the medications that you are taking.   If you have any questions or concerns, please feel free to call the clinic.   Be well,  Dr. Avon Gully

## 2017-03-13 DIAGNOSIS — E1140 Type 2 diabetes mellitus with diabetic neuropathy, unspecified: Secondary | ICD-10-CM | POA: Diagnosis not present

## 2017-03-13 DIAGNOSIS — E1151 Type 2 diabetes mellitus with diabetic peripheral angiopathy without gangrene: Secondary | ICD-10-CM | POA: Diagnosis not present

## 2017-03-13 DIAGNOSIS — M5030 Other cervical disc degeneration, unspecified cervical region: Secondary | ICD-10-CM | POA: Diagnosis not present

## 2017-03-13 DIAGNOSIS — Z87440 Personal history of urinary (tract) infections: Secondary | ICD-10-CM | POA: Diagnosis not present

## 2017-03-13 DIAGNOSIS — F0390 Unspecified dementia without behavioral disturbance: Secondary | ICD-10-CM | POA: Diagnosis not present

## 2017-03-13 DIAGNOSIS — M159 Polyosteoarthritis, unspecified: Secondary | ICD-10-CM | POA: Diagnosis not present

## 2017-03-15 DIAGNOSIS — M5030 Other cervical disc degeneration, unspecified cervical region: Secondary | ICD-10-CM | POA: Diagnosis not present

## 2017-03-15 DIAGNOSIS — M159 Polyosteoarthritis, unspecified: Secondary | ICD-10-CM | POA: Diagnosis not present

## 2017-03-15 DIAGNOSIS — E1151 Type 2 diabetes mellitus with diabetic peripheral angiopathy without gangrene: Secondary | ICD-10-CM | POA: Diagnosis not present

## 2017-03-15 DIAGNOSIS — F0390 Unspecified dementia without behavioral disturbance: Secondary | ICD-10-CM | POA: Diagnosis not present

## 2017-03-15 DIAGNOSIS — Z87440 Personal history of urinary (tract) infections: Secondary | ICD-10-CM | POA: Diagnosis not present

## 2017-03-15 DIAGNOSIS — E1140 Type 2 diabetes mellitus with diabetic neuropathy, unspecified: Secondary | ICD-10-CM | POA: Diagnosis not present

## 2017-03-15 NOTE — Patient Outreach (Signed)
[facility]) Care Management  03/15/2017  [name] Feb 13, 1924 [id number]   RNCM has not received any contact back with patient's daughter, [name] who lives with patient. Her sister requested that [facility] send a letter to try to outreach [place] and patient regarding follow up post discharge.   Unsuccessful outreach letter sent today. Will wait 10 business days before completing case closure if no return call has been received.  [place] R. Joanna Borawski, RN, BSN, [place] Management Coordinator [phone]

## 2017-03-19 ENCOUNTER — Telehealth: Payer: Self-pay | Admitting: Family Medicine

## 2017-03-19 NOTE — Telephone Encounter (Signed)
Wes would like verbal orders to extend PT, twice a week for four weeks. ep

## 2017-03-20 ENCOUNTER — Other Ambulatory Visit: Payer: Self-pay | Admitting: Family Medicine

## 2017-03-22 DIAGNOSIS — E1151 Type 2 diabetes mellitus with diabetic peripheral angiopathy without gangrene: Secondary | ICD-10-CM | POA: Diagnosis not present

## 2017-03-22 DIAGNOSIS — Z87440 Personal history of urinary (tract) infections: Secondary | ICD-10-CM | POA: Diagnosis not present

## 2017-03-22 DIAGNOSIS — F0390 Unspecified dementia without behavioral disturbance: Secondary | ICD-10-CM | POA: Diagnosis not present

## 2017-03-22 DIAGNOSIS — E1140 Type 2 diabetes mellitus with diabetic neuropathy, unspecified: Secondary | ICD-10-CM | POA: Diagnosis not present

## 2017-03-22 DIAGNOSIS — M5030 Other cervical disc degeneration, unspecified cervical region: Secondary | ICD-10-CM | POA: Diagnosis not present

## 2017-03-22 DIAGNOSIS — M159 Polyosteoarthritis, unspecified: Secondary | ICD-10-CM | POA: Diagnosis not present

## 2017-03-22 NOTE — Telephone Encounter (Signed)
Left verbal orders on voicemail.  [name], MD [facility] Family Medicine Resident  03/22/2017, 11:48 AM

## 2017-03-27 DIAGNOSIS — E1140 Type 2 diabetes mellitus with diabetic neuropathy, unspecified: Secondary | ICD-10-CM | POA: Diagnosis not present

## 2017-03-27 DIAGNOSIS — M159 Polyosteoarthritis, unspecified: Secondary | ICD-10-CM | POA: Diagnosis not present

## 2017-03-27 DIAGNOSIS — M5030 Other cervical disc degeneration, unspecified cervical region: Secondary | ICD-10-CM | POA: Diagnosis not present

## 2017-03-27 DIAGNOSIS — Z87440 Personal history of urinary (tract) infections: Secondary | ICD-10-CM | POA: Diagnosis not present

## 2017-03-27 DIAGNOSIS — E1151 Type 2 diabetes mellitus with diabetic peripheral angiopathy without gangrene: Secondary | ICD-10-CM | POA: Diagnosis not present

## 2017-03-27 DIAGNOSIS — F0390 Unspecified dementia without behavioral disturbance: Secondary | ICD-10-CM | POA: Diagnosis not present

## 2017-03-27 IMAGING — CT CT HEAD W/O CM
4 series · 17 of 47 positions shown, 19 images · non-contrast
Comparison: 11/24/2015

CLINICAL DATA: Altered mental status, hallucinations earlier today

EXAM:
CT HEAD WITHOUT CONTRAST
TECHNIQUE: Contiguous axial images were obtained from the base of the skull
through the vertex without intravenous contrast.

[Series 3: head without · axial · non-contrast · 0.42mm/px · z∈[-113,+7]mm · 7 of 32 slices shown, 9 images]
[im 4/32  brain]
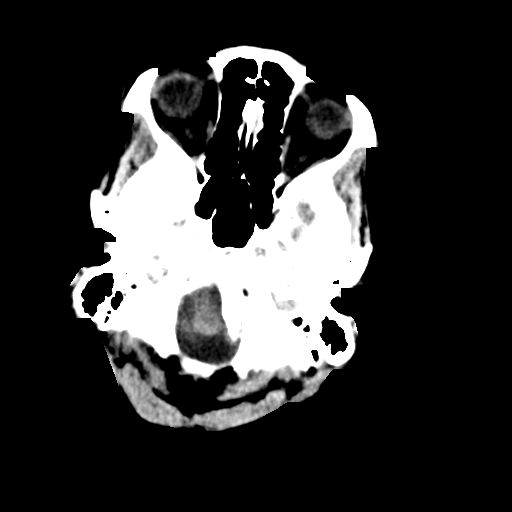
[im 4/32  bone]
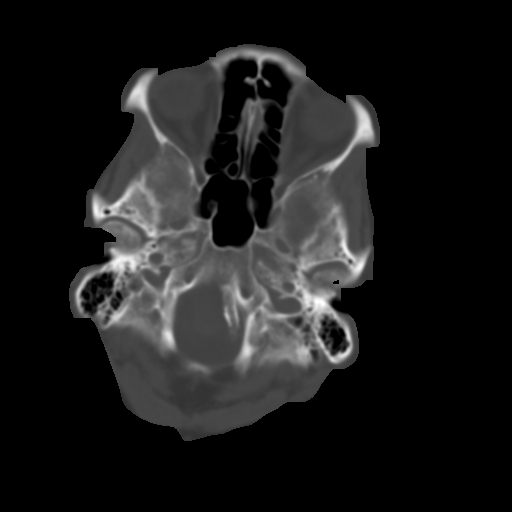
[im 8/32  brain]
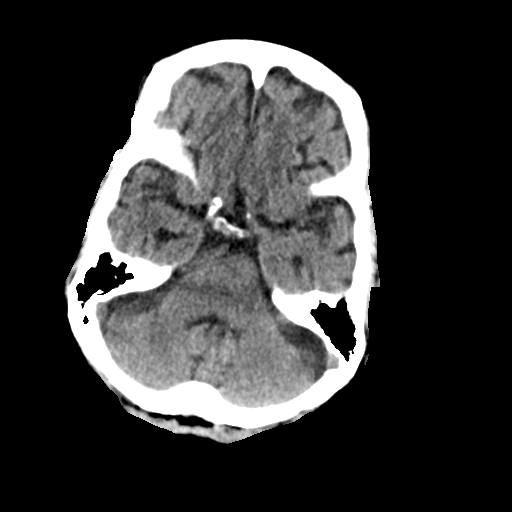
[im 12/32  brain]
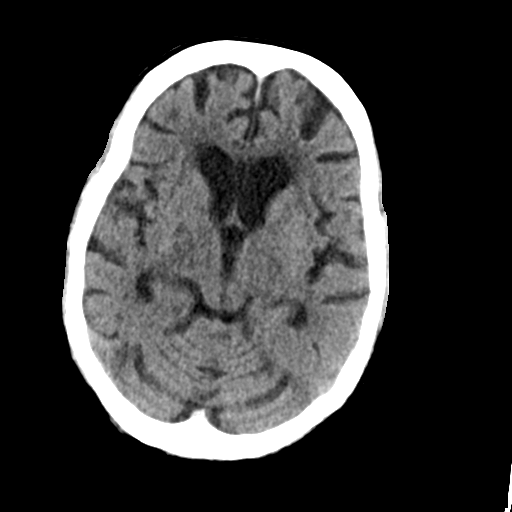
[im 16/32  brain]
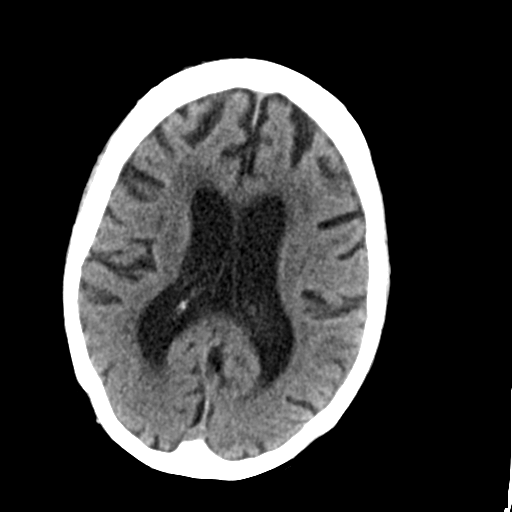
[im 20/32  brain]
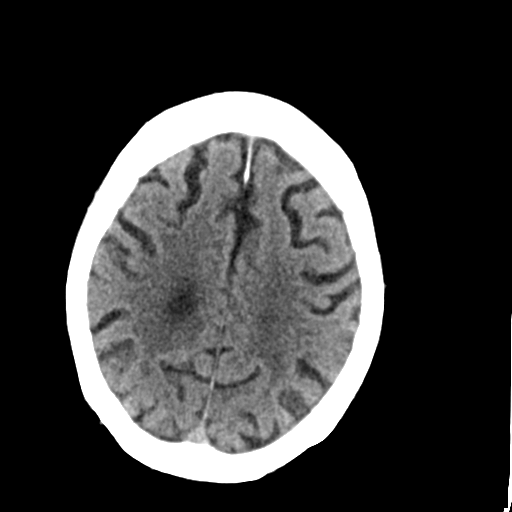
[im 20/32  bone]
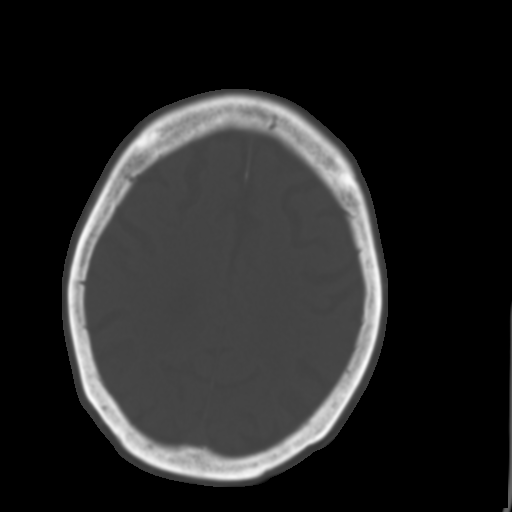
[im 24/32  brain]
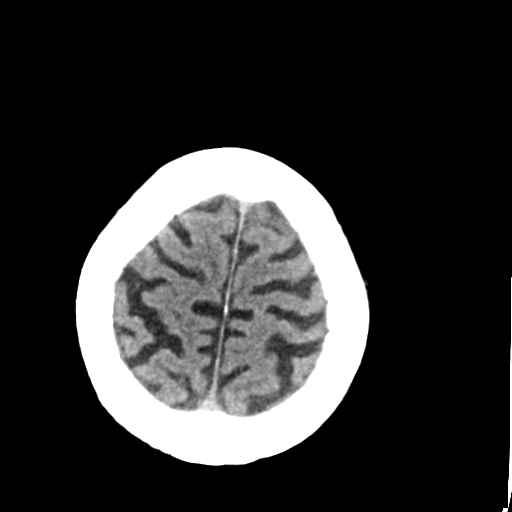
[im 28/32  brain]
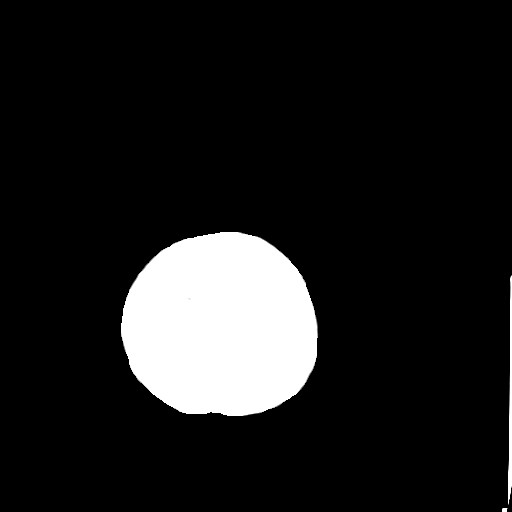

[Series 4: head bone · axial · 0.42mm/px · z∈[-114,-60]mm · 4 of 78 slices shown]
[im 8/78  bone]
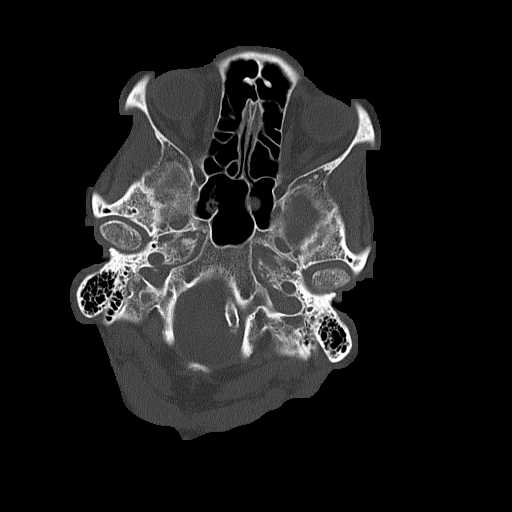
[im 16/78  bone]
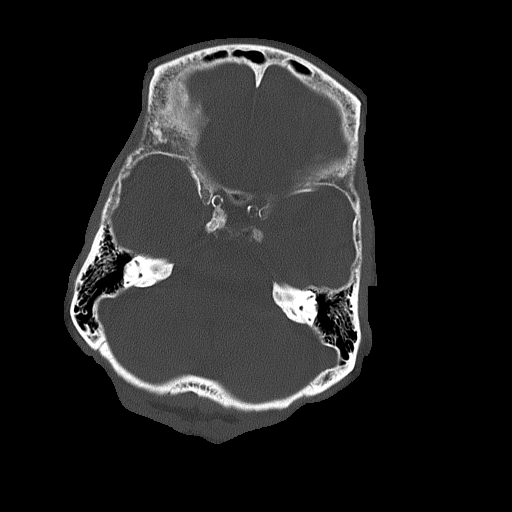
[im 24/78  bone]
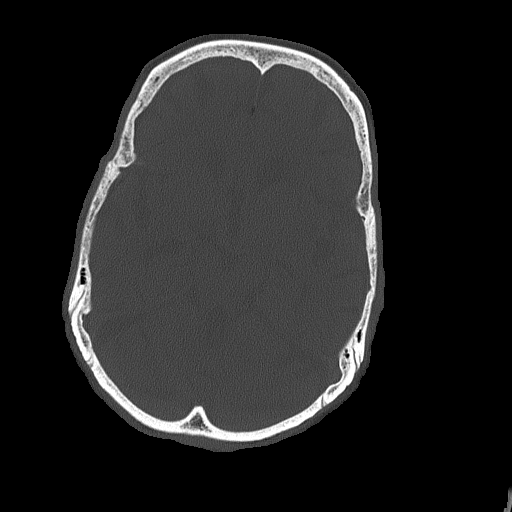
[im 35/78  bone]
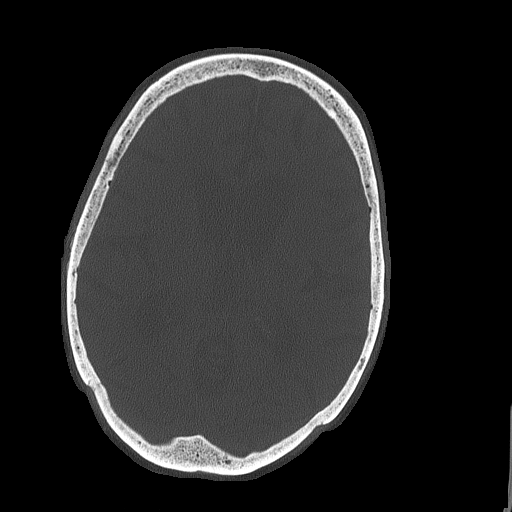

[Series 5: head without cor · coronal · non-contrast · 0.30mm/px · 3 of 67 slices shown]
[im 23/67  brain]
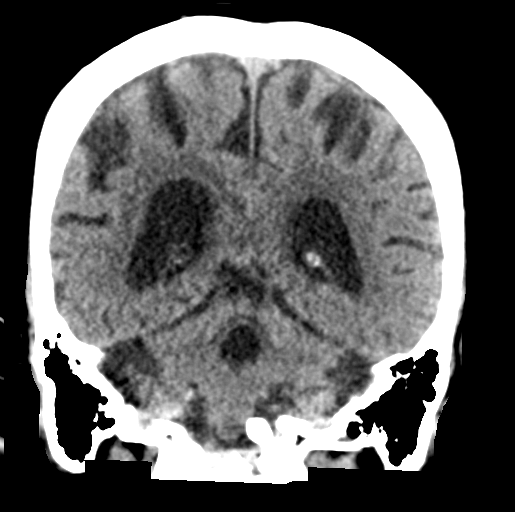
[im 30/67  brain]
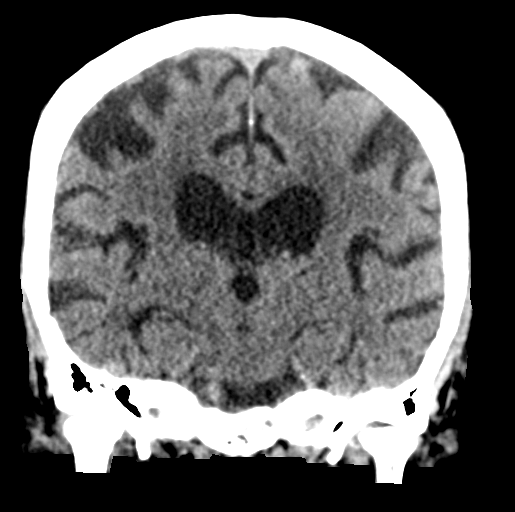
[im 37/67  brain]
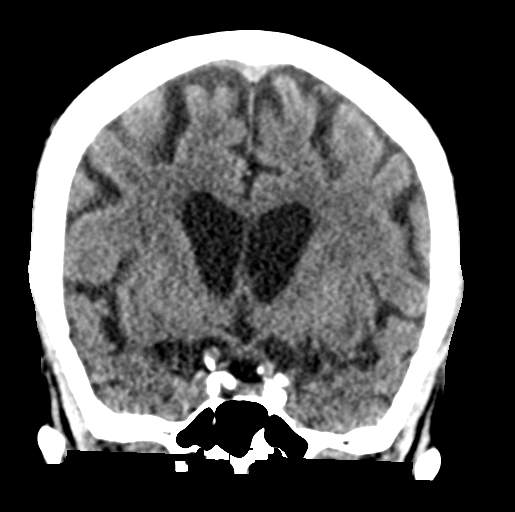

[Series 6: head without sag · sagittal · non-contrast · 0.30mm/px · 3 of 67 slices shown]
[im 23/67  brain]
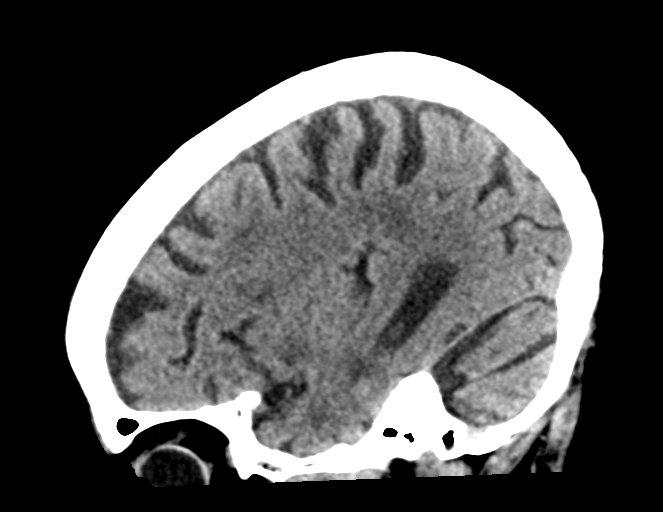
[im 34/67  brain]
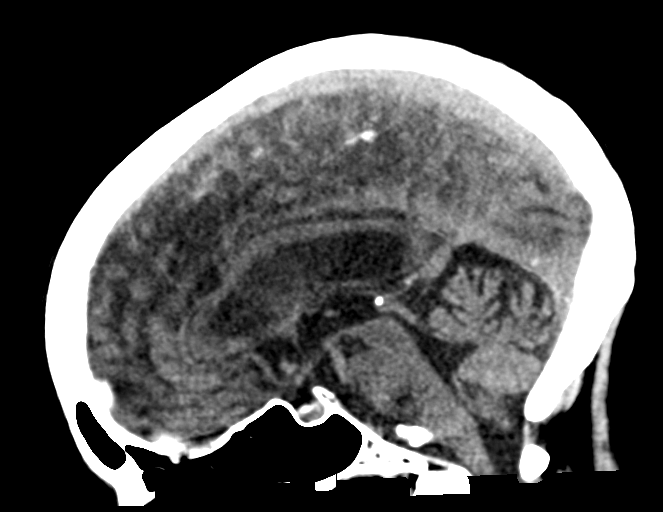
[im 45/67  brain]
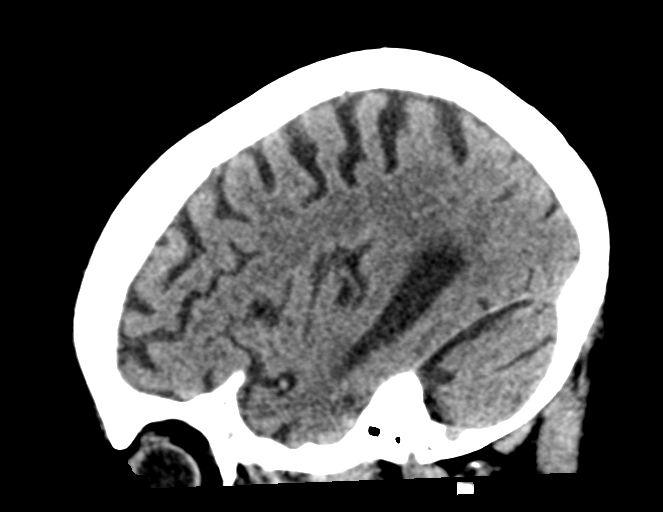

[17 of 47 positions shown; findings below may reference images not displayed]

FINDINGS: Brain: No intracranial hemorrhage, mass effect or midline shift. No
definite acute cortical infarction. No mass lesion is noted on this
unenhanced scan. Stable atrophy and chronic white matter disease. No
mass lesion is noted on this unenhanced scan. Ventricular size is
stable from prior exam.

Vascular: Atherosclerotic calcifications of carotid siphon again
noted. Extensive atherosclerotic calcifications vertebral arteries.

Skull: No skull fracture.

Sinuses/Orbits: No acute findings

Other: None
IMPRESSION: No acute intracranial abnormality. Stable atrophy and chronic white
matter disease. No definite acute cortical infarction.

## 2017-03-27 IMAGING — CR DG CHEST 2V
2 series · 2 of 2 positions shown · non-contrast
Comparison: 08/30/2016

CLINICAL DATA: Altered mental status

EXAM:
CHEST  2 VIEW

[chest lat]
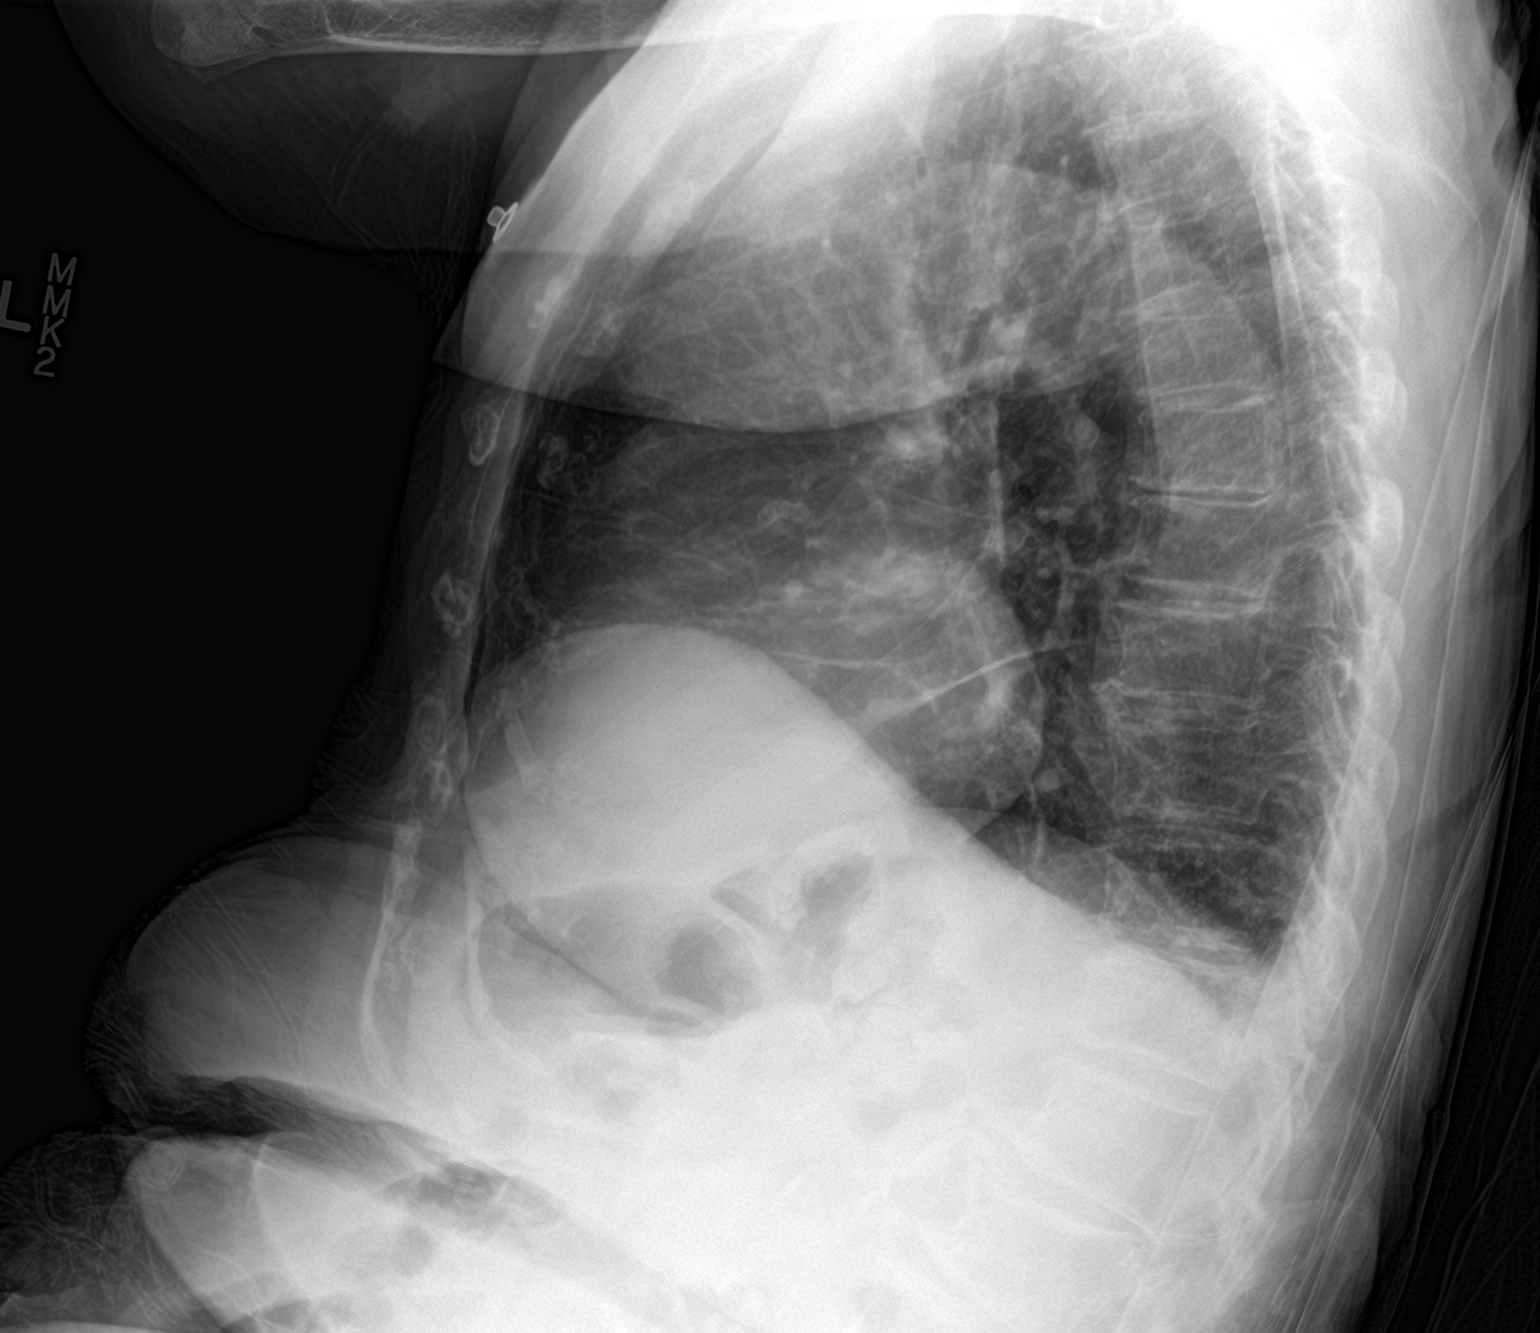

[chest ap]
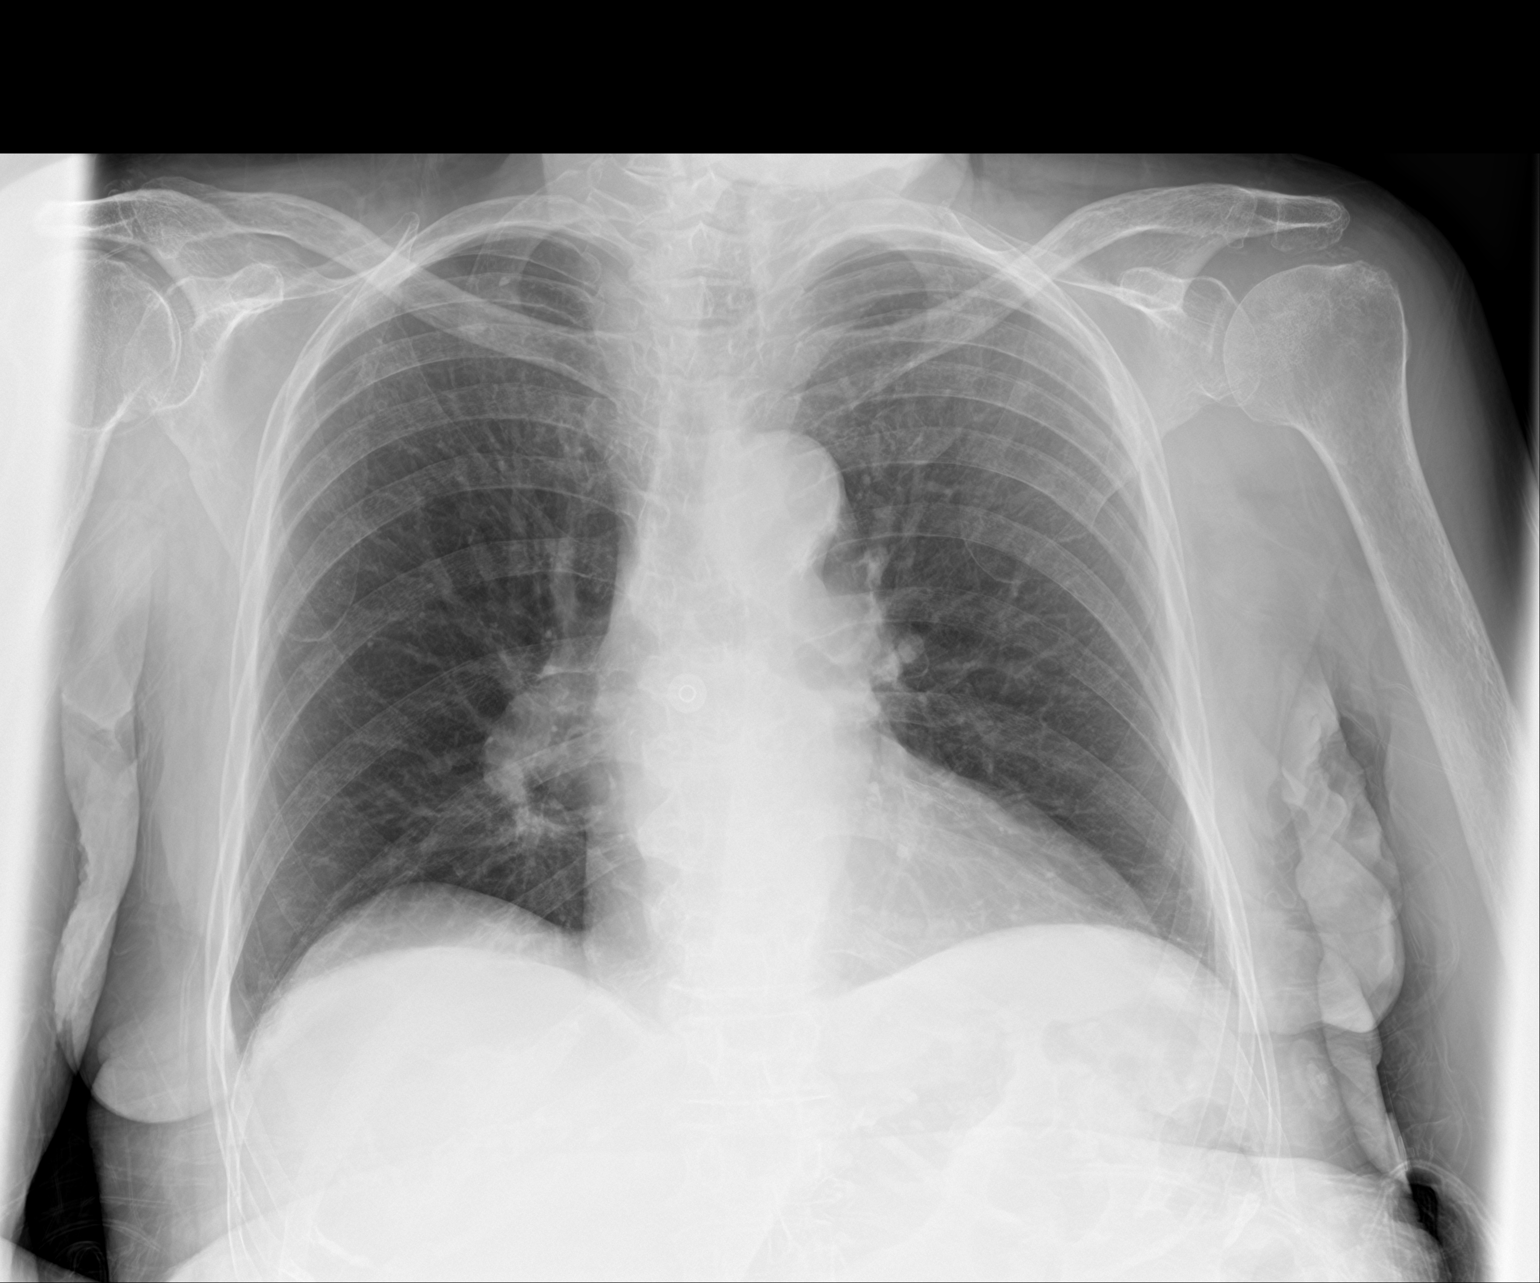

[2 of 2 positions shown; findings below may reference images not displayed]

FINDINGS: Cardiomediastinal silhouette is stable. No infiltrate or pleural
effusion. No pulmonary edema. Osteopenia and degenerative changes
thoracic spine again noted.
IMPRESSION: No active cardiopulmonary disease.

## 2017-03-29 DIAGNOSIS — E1140 Type 2 diabetes mellitus with diabetic neuropathy, unspecified: Secondary | ICD-10-CM | POA: Diagnosis not present

## 2017-03-29 DIAGNOSIS — M159 Polyosteoarthritis, unspecified: Secondary | ICD-10-CM | POA: Diagnosis not present

## 2017-03-29 DIAGNOSIS — M5030 Other cervical disc degeneration, unspecified cervical region: Secondary | ICD-10-CM | POA: Diagnosis not present

## 2017-03-29 DIAGNOSIS — F0390 Unspecified dementia without behavioral disturbance: Secondary | ICD-10-CM | POA: Diagnosis not present

## 2017-03-29 DIAGNOSIS — Z87440 Personal history of urinary (tract) infections: Secondary | ICD-10-CM | POA: Diagnosis not present

## 2017-03-29 DIAGNOSIS — E1151 Type 2 diabetes mellitus with diabetic peripheral angiopathy without gangrene: Secondary | ICD-10-CM | POA: Diagnosis not present

## 2017-03-30 ENCOUNTER — Other Ambulatory Visit: Payer: Self-pay

## 2017-03-30 NOTE — Patient Outreach (Signed)
[facility]) Care Management  03/30/17  [name] [birth year] [id number]  RNCM has not been able to establish contact with patient or her daughter, [name], with whom she lives with. Will perform case closure and update PCP.  [place] R. Blimi Godby, RN, BSN, [place] Management Coordinator [phone]

## 2017-04-02 DIAGNOSIS — M159 Polyosteoarthritis, unspecified: Secondary | ICD-10-CM | POA: Diagnosis not present

## 2017-04-02 DIAGNOSIS — E1140 Type 2 diabetes mellitus with diabetic neuropathy, unspecified: Secondary | ICD-10-CM | POA: Diagnosis not present

## 2017-04-02 DIAGNOSIS — M5030 Other cervical disc degeneration, unspecified cervical region: Secondary | ICD-10-CM | POA: Diagnosis not present

## 2017-04-02 DIAGNOSIS — E1151 Type 2 diabetes mellitus with diabetic peripheral angiopathy without gangrene: Secondary | ICD-10-CM | POA: Diagnosis not present

## 2017-04-02 DIAGNOSIS — F0390 Unspecified dementia without behavioral disturbance: Secondary | ICD-10-CM | POA: Diagnosis not present

## 2017-04-02 DIAGNOSIS — Z87440 Personal history of urinary (tract) infections: Secondary | ICD-10-CM | POA: Diagnosis not present

## 2017-04-03 ENCOUNTER — Ambulatory Visit (HOSPITAL_COMMUNITY)
Admission: EM | Admit: 2017-04-03 | Discharge: 2017-04-03 | Disposition: A | Discharge status: Home / Self Care | Payer: Medicare Other | Attending: Family Medicine | Admitting: Family Medicine

## 2017-04-03 ENCOUNTER — Encounter (HOSPITAL_COMMUNITY): Payer: Self-pay | Admitting: Emergency Medicine

## 2017-04-03 DIAGNOSIS — R059 Cough, unspecified: Secondary | ICD-10-CM

## 2017-04-03 DIAGNOSIS — J209 Acute bronchitis, unspecified: Secondary | ICD-10-CM | POA: Diagnosis not present

## 2017-04-03 DIAGNOSIS — R05 Cough: Secondary | ICD-10-CM

## 2017-04-03 MED ORDER — AZITHROMYCIN 250 MG PO TABS: 250.0000 mg | ORAL_TABLET | Freq: Every day | ORAL | 0 refills | Status: DC

## 2017-04-03 MED ORDER — BENZONATATE 100 MG PO CAPS: 100.0000 mg | ORAL_CAPSULE | Freq: Three times a day (TID) | ORAL | 0 refills | Status: DC

## 2017-04-03 MED ORDER — METHYLPREDNISOLONE 4 MG PO TBPK: ORAL_TABLET | ORAL | 0 refills | Status: DC

## 2017-04-03 NOTE — ED Provider Notes (Signed)
CSN: [id number]     Arrival date & time 04/03/17  1340 History   None    Chief Complaint  Patient presents with  . Cough   (Consider location/radiation/quality/duration/timing/severity/associated sxs/prior Treatment) Patient c/o cough and uri sx's for over a week.   The history is provided by the patient.  Cough  Cough characteristics:  Productive Sputum characteristics:  White Severity:  Moderate Onset quality:  Sudden Duration:  2 weeks Timing:  Constant Progression:  Worsening Chronicity:  New Context: upper respiratory infection and weather changes   Relieved by:  Nothing Worsened by:  Nothing Ineffective treatments:  None tried   Past Medical History:  Diagnosis Date  . Allergy   . Anemia    Iron def anemia  . Arthritis   . Asthma   . Breast cancer ([place]) 10/2010   Invasive Ductal Carcinoma, s/p bilateral mastectomy, now on Tamoxifen. Followed by Dr Jamse Arn.   . Breast cancer ([place]) 2011   s/p Bilateral masectomy  . CAD (coronary artery disease)   . Carotid artery aneurysm ([place]) 08/2010   right ICA, 5 x 35m  . CHF (congestive heart failure) (HCC)    Systolic   . Colon cancer ([place]   . [place]  . DDD (degenerative disc disease), cervical   . Diabetes mellitus   . Diverticulosis   . GERD (gastroesophageal reflux disease)   . H/O: GI bleed   . Hiatal hernia   . History of colon cancer   . Hypercalcemia 06/09/2014  . Hyperlipidemia   . Hypertension   . Myocardial infarction ([place]   . Neuropathy   . Pneumonia 03/2012  . Shortness of breath    Past Surgical History:  Procedure Laterality Date  . Breast masectomy  10/2010   Bilateral masectomy by Dr NLucia Gaskinss/p invasive ductal carcinoma.  .Marland KitchenBREAST SURGERY    . Cardiac  Catherization  2007   Severe 3-vessel disease.  EF 20-25%.  . ESOPHAGOGASTRODUODENOSCOPY  2001   Esophageal Tear  . ESOPHAGOGASTRODUODENOSCOPY  10/27/2012   Procedure: ESOPHAGOGASTRODUODENOSCOPY (EGD);  Surgeon: JIrene Shipper MD;  [facility];  Service: Endoscopy;  Laterality: N/A;  pat  . JOINT REPLACEMENT Right 2001   Knee  . TOTAL KNEE ARTHROPLASTY  2001   Family History  Problem Relation Age of Onset  . Sickle cell anemia Other   . Heart disease Mother   . Heart disease Father   . Cancer Daughter 342      breast ca  . Hypertension Daughter   . Cancer Daughter 53      breast ca  . Hypertension Daughter   . Heart disease Son        Poor circulation-Left Leg  . Diabetes Daughter   . Hypertension Daughter   . Hyperlipidemia Daughter        Poor circulation- Toe amputation   Social History  Substance Use Topics  . Smoking status: Never Smoker  . Smokeless tobacco: Current User    Types: Snuff  . Alcohol use No   OB History    No data available     Review of Systems  Constitutional: Negative.   HENT: Negative.   Eyes: Negative.   Respiratory: Positive for cough.   Cardiovascular: Negative.   Gastrointestinal: Negative.   Endocrine: Negative.   Genitourinary: Negative.   Musculoskeletal: Negative.   Allergic/Immunologic: Negative.   Neurological: Negative.   Hematological: Negative.   Psychiatric/Behavioral: Negative.     Allergies  Tape and Lisinopril  Home Medications   Prior to Admission medications   Medication Sig Start Date End Date Taking? Authorizing Provider  acetaminophen (TYLENOL) 500 MG tablet Take 500 mg by mouth every 6 (six) hours as needed for moderate pain.     [provider]  albuterol (PROVENTIL HFA;VENTOLIN HFA) 108 (90 BASE) MCG/ACT inhaler Inhale 2 puffs into the lungs every 6 (six) hours as needed. For shortness of breath Patient taking differently: Inhale 2 puffs into the lungs every 6 (six) hours as needed for shortness of breath.  01/18/15   [name], MD  Amino Acids-Protein Hydrolys (FEEDING SUPPLEMENT, PRO-STAT SUGAR FREE 64,) LIQD Take 30 mLs by mouth 2 (two) times daily with a meal.    [provider]   amLODipine (NORVASC) 5 MG tablet Take 1 tablet (5 mg total) by mouth daily. 11/21/16   [name], MD  aspirin (ASPIRIN CHILDRENS) 81 MG chewable tablet Chew 81 mg by mouth daily.     [provider]  azithromycin (ZITHROMAX) 250 MG tablet Take 1 tablet (250 mg total) by mouth daily. Take first 2 tablets together, then 1 every day until finished. 04/03/17   Lysbeth Penner, FNP  benzonatate (TESSALON) 100 MG capsule Take 1 capsule (100 mg total) by mouth every 8 (eight) hours. 04/03/17   Lysbeth Penner, FNP  benzonatate (TESSALON) 200 MG capsule Take 1 capsule (200 mg total) by mouth 3 (three) times daily as needed for cough. 03/12/17   Verner Mould, MD  Blood Pressure KIT Use the blood pressure kit to check you blood pressure twice a day 08/27/15   [name], MD  capsaicin (ZOSTRIX) 0.025 % cream APPLY TO AFFECTED AREA TWICE A DAY 04/11/16   [name], MD  carvedilol (COREG) 12.5 MG tablet TAKE 1 TABLET (12.5 MG TOTAL) BY MOUTH 2 (TWO) TIMES DAILY WITH A MEAL. 09/18/16   [name], MD  clopidogrel (PLAVIX) 75 MG tablet Take 75 mg by mouth daily. 01/20/17   [provider]  diclofenac sodium (VOLTAREN) 1 % GEL APPLY 2 grams topically 4 TIMES DAILY AS NEEDED FOR PAIN 09/23/15   [name], MD  ELIQUIS 5 MG TABS tablet TAKE 1 TABLET TWICE A DAY 03/21/17   [name], MD  ferrous sulfate 325 (65 FE) MG tablet Take 1 tablet (325 mg total) by mouth daily with breakfast. 11/21/16   [name], MD  glucose blood test strip Use as instructed Dx. Code 250.02 09/28/14   [name], MD  guaifenesin (ROBITUSSIN) 100 MG/5ML syrup Take 10 mLs (200 mg total) by mouth 3 (three) times daily as needed for cough. 08/15/16   [name], MD  loratadine (CLARITIN) 10 MG tablet TAKE 1/2 TABLET BY MOUTH DAILY 12/08/15   [name], MD  losartan (COZAAR) 50 MG tablet TAKE 1 TABLET (50 MG TOTAL) BY MOUTH AT BEDTIME. 11/21/16    [name], MD  Menthol, Topical Analgesic, (BIOFREEZE EX) Apply 1 application topically 2 (two) times daily as needed (pain).    [provider]  methylPREDNISolone (MEDROL DOSEPAK) 4 MG TBPK tablet Take 6-5-4-3-2-1 po qd 04/03/17   Lysbeth Penner, FNP  PATADAY 0.2 % SOLN APPLY 1 DROP TO EYE DAILY. Patient taking differently: Instill 1 drop into each eye once a day 11/23/15   [name], MD  rosuvastatin (CRESTOR) 20 MG tablet Take 1 tablet (20 mg total) by mouth at  bedtime. 01/08/17   Verner Mould, MD  senna-docusate (SENOKOT-S) 8.6-50 MG tablet Take 20 tablets by mouth at bedtime as needed for mild constipation. 02/06/17   Lovenia Kim, MD  traMADol (ULTRAM) 50 MG tablet Take 1 tablet (50 mg total) by mouth every 8 (eight) hours as needed for severe pain. 03/02/17   [name], MD   Meds Ordered and Administered this Visit  Medications - No data to display  BP (!) 139/50 (BP Location: Right Arm)   Pulse 63   Temp 98.6 F (37 C) (Oral)   Resp 18   SpO2 100%  No data found.   Physical Exam  Constitutional: She is oriented to person, place, and time. She appears well-developed and well-nourished.  HENT:  Head: Normocephalic and atraumatic.  Right Ear: External ear normal.  Left Ear: External ear normal.  Mouth/Throat: Oropharynx is clear and moist.  Eyes: Conjunctivae are normal. Pupils are equal, round, and reactive to light.  Neck: Normal range of motion. Neck supple.  Cardiovascular: Normal rate, regular rhythm and normal heart sounds.   Pulmonary/Chest: Effort normal and breath sounds normal.  Abdominal: Soft. Bowel sounds are normal.  Musculoskeletal: Normal range of motion.  Neurological: She is alert and oriented to person, place, and time.  Nursing note and vitals reviewed.   Urgent Care Course     Procedures (including critical care time)  Labs Review Labs Reviewed - No data to display  Imaging Review No results  found.   Visual Acuity Review  Right Eye Distance:   Left Eye Distance:   Bilateral Distance:    Right Eye Near:   Left Eye Near:    Bilateral Near:         MDM   1. Acute bronchitis, unspecified organism   2. Cough    zpak as directed Medrol dose pack as directed Tessalon perles  Push po fluids, rest, tylenol and motrin otc prn as directed for fever, arthralgias, and myalgias.  Follow up prn if sx's continue or persist.    Lysbeth Penner, [place] 04/03/17 [phone]

## 2017-04-03 NOTE — ED Triage Notes (Signed)
The patient presented to the [facility] with a complaint of cough and related soreness x 1.5 weeks.

## 2017-04-05 DIAGNOSIS — E1140 Type 2 diabetes mellitus with diabetic neuropathy, unspecified: Secondary | ICD-10-CM | POA: Diagnosis not present

## 2017-04-05 DIAGNOSIS — M159 Polyosteoarthritis, unspecified: Secondary | ICD-10-CM | POA: Diagnosis not present

## 2017-04-05 DIAGNOSIS — E1151 Type 2 diabetes mellitus with diabetic peripheral angiopathy without gangrene: Secondary | ICD-10-CM | POA: Diagnosis not present

## 2017-04-05 DIAGNOSIS — M5030 Other cervical disc degeneration, unspecified cervical region: Secondary | ICD-10-CM | POA: Diagnosis not present

## 2017-04-05 DIAGNOSIS — Z87440 Personal history of urinary (tract) infections: Secondary | ICD-10-CM | POA: Diagnosis not present

## 2017-04-05 DIAGNOSIS — F0390 Unspecified dementia without behavioral disturbance: Secondary | ICD-10-CM | POA: Diagnosis not present

## 2017-04-10 DIAGNOSIS — Z87440 Personal history of urinary (tract) infections: Secondary | ICD-10-CM | POA: Diagnosis not present

## 2017-04-10 DIAGNOSIS — M5030 Other cervical disc degeneration, unspecified cervical region: Secondary | ICD-10-CM | POA: Diagnosis not present

## 2017-04-10 DIAGNOSIS — E1151 Type 2 diabetes mellitus with diabetic peripheral angiopathy without gangrene: Secondary | ICD-10-CM | POA: Diagnosis not present

## 2017-04-10 DIAGNOSIS — F0390 Unspecified dementia without behavioral disturbance: Secondary | ICD-10-CM | POA: Diagnosis not present

## 2017-04-10 DIAGNOSIS — M159 Polyosteoarthritis, unspecified: Secondary | ICD-10-CM | POA: Diagnosis not present

## 2017-04-10 DIAGNOSIS — E1140 Type 2 diabetes mellitus with diabetic neuropathy, unspecified: Secondary | ICD-10-CM | POA: Diagnosis not present

## 2017-04-12 DIAGNOSIS — Z87440 Personal history of urinary (tract) infections: Secondary | ICD-10-CM | POA: Diagnosis not present

## 2017-04-12 DIAGNOSIS — E1140 Type 2 diabetes mellitus with diabetic neuropathy, unspecified: Secondary | ICD-10-CM | POA: Diagnosis not present

## 2017-04-12 DIAGNOSIS — M5030 Other cervical disc degeneration, unspecified cervical region: Secondary | ICD-10-CM | POA: Diagnosis not present

## 2017-04-12 DIAGNOSIS — E1151 Type 2 diabetes mellitus with diabetic peripheral angiopathy without gangrene: Secondary | ICD-10-CM | POA: Diagnosis not present

## 2017-04-12 DIAGNOSIS — M159 Polyosteoarthritis, unspecified: Secondary | ICD-10-CM | POA: Diagnosis not present

## 2017-04-12 DIAGNOSIS — F0390 Unspecified dementia without behavioral disturbance: Secondary | ICD-10-CM | POA: Diagnosis not present

## 2017-04-13 ENCOUNTER — Other Ambulatory Visit: Payer: Self-pay | Admitting: Family Medicine

## 2017-04-16 ENCOUNTER — Other Ambulatory Visit: Payer: Self-pay | Admitting: *Deleted

## 2017-04-16 DIAGNOSIS — M546 Pain in thoracic spine: Secondary | ICD-10-CM

## 2017-04-17 NOTE — Telephone Encounter (Signed)
No answer on number listed.

## 2017-04-17 NOTE — Telephone Encounter (Signed)
I have a request for tramadol, please have the pt come in to discuss her pain as I have not seen her in some time (and our clinic has not seen her about pain recently).  Thanks, [name], MD [facility] Family Medicine Resident  04/17/2017, 8:45 AM

## 2017-04-17 NOTE — Telephone Encounter (Signed)
Spoke with patient, advised her she needs OV to discuss pain and RX. She is unable to schedule appointment until she speaks with her son about his availability to bring her and then she will call for OV.

## 2017-04-23 ENCOUNTER — Encounter: Payer: Self-pay | Admitting: Family Medicine

## 2017-04-23 ENCOUNTER — Ambulatory Visit (INDEPENDENT_AMBULATORY_CARE_PROVIDER_SITE_OTHER): Payer: Medicare Other | Admitting: Family Medicine

## 2017-04-23 VITALS — BP 110/60 | HR 60 | Temp 98.7°F | Ht 59.0 in | Wt 116.0 lb

## 2017-04-23 DIAGNOSIS — K5900 Constipation, unspecified: Secondary | ICD-10-CM

## 2017-04-23 DIAGNOSIS — M546 Pain in thoracic spine: Secondary | ICD-10-CM

## 2017-04-23 MED ORDER — SENNOSIDES-DOCUSATE SODIUM 8.6-50 MG PO TABS: 2.0000 | ORAL_TABLET | Freq: Every day | ORAL | 3 refills | Status: AC

## 2017-04-23 MED ORDER — TRAMADOL HCL 50 MG PO TABS: 50.0000 mg | ORAL_TABLET | Freq: Three times a day (TID) | ORAL | 0 refills | Status: DC | PRN

## 2017-04-23 NOTE — Patient Instructions (Signed)

## 2017-04-23 NOTE — Assessment & Plan Note (Signed)
Discussed appropriate hydration and fiber intake Resume Senokot S daily at bedtime Exam benign Could consider adding Mira lax to bowel regimen if Senokot-S does not help Follow-up with PCP in about 2 weeks for chronic medical issues and see how constipation is doing at that time

## 2017-04-23 NOTE — Progress Notes (Signed)
   Subjective:   [name] is a 83 y.o. female with a history of HTN, PAD, CAD, diastolic heart failure, asthma, PE, T2 DM here for same day appointment for  Chief Complaint  Patient presents with  . Constipation    wants a softner  . Chills    Patient reports issues with constipation since recent hospitalization. She was discharged home on Senokot as that she was taking nightly at bedtime that was helping with her constipation. Since Ms. placing this medication, she has had more trouble with constipation. She did have a bowel movement this morning, but prior to that she had not had one in one week. She denies any current abdominal pain, nausea, vomiting, urinary difficulties, fever, decreased oral intake. She would like to go back on the same bowel regimen as this was working previously.  She also reports that she is out of her tramadol that she takes for chronic back pain.  Review of Systems:  Per HPI.   Social History: never smoker  Objective:  BP 110/60   Pulse 60   Temp 98.7 F (37.1 C) (Oral)   Ht 4\' 11"  (1.499 m)   Wt 116 lb (52.6 kg)   BMI 23.43 kg/m   Gen:  83 y.o. female in NAD HEENT: NCAT, MMM, anicteric sclerae CV: RRR, no MRG Resp: Non-labored, CTAB, no wheezes noted Abd: Soft, NTND, BS present, no guarding or organomegaly Ext: WWP, no edema Neuro: Alert, speech normal     Assessment & Plan:     [name] is a 83 y.o. female here for   Constipation Discussed appropriate hydration and fiber intake Resume Senokot S daily at bedtime Exam benign Could consider adding Mira lax to bowel regimen if Senokot-S does not help Follow-up with PCP in about 2 weeks for chronic medical issues and see how constipation is doing at that time   Also refilled Tramadol #30 to last until PCP f/u   Brita Romp, Dionne Bucy, MD MPH PGY-3,  [facility] 04/23/2017  4:26 PM

## 2017-04-25 ENCOUNTER — Ambulatory Visit (INDEPENDENT_AMBULATORY_CARE_PROVIDER_SITE_OTHER): Payer: Medicare Other | Admitting: Podiatry

## 2017-04-25 ENCOUNTER — Encounter: Payer: Self-pay | Admitting: Podiatry

## 2017-04-25 VITALS — BP 112/70 | HR 80 | Temp 97.6°F

## 2017-04-25 DIAGNOSIS — S90822A Blister (nonthermal), left foot, initial encounter: Secondary | ICD-10-CM

## 2017-04-25 DIAGNOSIS — L089 Local infection of the skin and subcutaneous tissue, unspecified: Secondary | ICD-10-CM | POA: Diagnosis not present

## 2017-04-25 DIAGNOSIS — B351 Tinea unguium: Secondary | ICD-10-CM | POA: Diagnosis not present

## 2017-04-25 DIAGNOSIS — M79676 Pain in unspecified toe(s): Secondary | ICD-10-CM

## 2017-04-25 DIAGNOSIS — I739 Peripheral vascular disease, unspecified: Secondary | ICD-10-CM

## 2017-04-25 MED ORDER — CEPHALEXIN 500 MG PO CAPS: 500.0000 mg | ORAL_CAPSULE | Freq: Two times a day (BID) | ORAL | 1 refills | Status: DC

## 2017-04-25 NOTE — Patient Instructions (Addendum)
If you develop any sudden increase in pain, swelling, redness, fever present to the emergency department  Return for follow-up care if blisters are not improving after antibiotics and soaks Betadine Soak Instructions  Purchase an 8 oz. bottle of BETADINE solution (Povidone)  THE DAY AFTER THE PROCEDURE  Place 1 tablespoon of betadine solution in a quart of warm tap water.  Submerge your foot or feet with outer bandage intact for the initial soak; this will allow the bandage to become moist and wet for easy lift off.  Once you remove your bandage, continue to soak in the solution for 2 minutes.  This soak should be done twice a day.  Next, remove your foot or feet from solution, blot dry the affected area and cover.  You may use a band aid large enough to cover the area or use gauze and tape.  Apply other medications to the area as directed by the doctor such as triple antibiotic ointment  IF YOUR SKIN BECOMES IRRITATED WHILE USING THESE INSTRUCTIONS, IT IS OKAY TO SWITCH TO EPSOM SALTS AND WATER OR WHITE VINEGAR AND WATER.   Diabetes and Foot Care   Diabetes may cause you to have problems because of poor blood supply (circulation) to your feet and legs. This may cause the skin on your feet to become thinner, break easier, and heal more slowly. Your skin may become dry, and the skin may peel and crack. You may also have nerve damage in your legs and feet causing decreased feeling in them. You may not notice minor injuries to your feet that could lead to infections or more serious problems. Taking care of your feet is one of the most important things you can do for yourself. Follow these instructions at home:  Wear shoes at all times, even in the house. Do not go barefoot. Bare feet are easily injured.  Check your feet daily for blisters, cuts, and redness. If you cannot see the bottom of your feet, use a mirror or ask someone for help.  Wash your feet with warm water (do not use hot water) and  mild soap. Then pat your feet and the areas between your toes until they are completely dry. Do not soak your feet as this can dry your skin.  Apply a moisturizing lotion or petroleum jelly (that does not contain alcohol and is unscented) to the skin on your feet and to dry, brittle toenails. Do not apply lotion between your toes.  Trim your toenails straight across. Do not dig under them or around the cuticle. File the edges of your nails with an emery board or nail file.  Do not cut corns or calluses or try to remove them with medicine.  Wear clean socks or stockings every day. Make sure they are not too tight. Do not wear knee-high stockings since they may decrease blood flow to your legs.  Wear shoes that fit properly and have enough cushioning. To break in new shoes, wear them for just a few hours a day. This prevents you from injuring your feet. Always look in your shoes before you put them on to be sure there are no objects inside.  Do not cross your legs. This may decrease the blood flow to your feet.  If you find a minor scrape, cut, or break in the skin on your feet, keep it and the skin around it clean and dry. These areas may be cleansed with mild soap and water. Do not cleanse the area  with peroxide, alcohol, or iodine.  When you remove an adhesive bandage, be sure not to damage the skin around it.  If you have a wound, look at it several times a day to make sure it is healing.  Do not use heating pads or hot water bottles. They may burn your skin. If you have lost feeling in your feet or legs, you may not know it is happening until it is too late.  Make sure your health care provider performs a complete foot exam at least annually or more often if you have foot problems. Report any cuts, sores, or bruises to your health care provider immediately. Contact a health care provider if:  You have an injury that is not healing.  You have cuts or breaks in the skin.  You have an  ingrown nail.  You notice redness on your legs or feet.  You feel burning or tingling in your legs or feet.  You have pain or cramps in your legs and feet.  Your legs or feet are numb.  Your feet always feel cold. Get help right away if:  There is increasing redness, swelling, or pain in or around a wound.  There is a red line that goes up your leg.  Pus is coming from a wound.  You develop a fever or as directed by your health care provider.  You notice a bad smell coming from an ulcer or wound. This information is not intended to replace advice given to you by your health care provider. Make sure you discuss any questions you have with your health care provider. Document Released: 10/27/2000 Document Revised: 04/06/2016 Document Reviewed: 04/08/2013 Elsevier Interactive Patient Education  2017 [employer].

## 2017-04-25 NOTE — Progress Notes (Signed)
   Subjective:    Patient ID: [name], female    DOB: 05-08-1924, 83 y.o.   MRN: [id number]  HPI   this patient presents today with her daughter present his treatment room for a scheduled visit for debridement of painful toenails which are cough walking wearing shoes and requesting nail debridement. She is also complaining of approximately one week history of pain and posterior heel is left greater than right request evaluation Patient is diabetic with a history of peripheral arterial disease and neuropathy  Review of Systems  Musculoskeletal: Positive for gait problem and joint swelling.  Skin: Positive for color change.       Objective:   Physical Exam  Objective: Orientated 3 Are full pitting edema bilaterally DP and PT pulses trace palpable bilaterally Capillary reflex delayed bilaterally Sensation to 10 g monofilament wire intact 5/5 bilaterally Vibratory sensation nonreactive bilaterally Ankle reflex reactive bilaterally Dorsi flexion right 5/5 right and 4/5 left Plantar flexion 5/5 bilaterally Patient walks slowly with walker Atrophic skin bilaterally with absent hair growth bilaterally Posterior left heel has 20 mm reactive blister surrounded with erythema. Puncture of the blister releases a bloody serous drainage without malodor. Posterior right heel has eschar and resolving blister without surrounding erythema The toenails are hypertrophic, elongated, deformed, discolored and tender direct palpation 6-10      Assessment & Plan:   Assessment: Diabetic with peripheral arterial disease and peripheral neuropathy Symptomatic mycotic toenails 6-10 Blister cellulitis left heel Resolving blister formation right heel without cellulitis  Plan: ID blister posterior left heel apply antibiotic dressing Rx diluted Betadine soaks Rx cephalexin 500 mg by mouth twice a day 7 days Apply antibiotic dressing to right heel Instructed patient how to offload posterior  heels Debridement of toenails 6-10 mechanically and left without a bleeding Patient instructed present to emergency department if she noticed any sudden increase of pain, swelling, redness, fever in the heel area. Reappoint at three-month intervals for nail debridement or sooner if patient or patient's daughter has concern

## 2017-04-26 ENCOUNTER — Other Ambulatory Visit: Payer: Self-pay | Admitting: Family Medicine

## 2017-05-02 ENCOUNTER — Encounter: Payer: Self-pay | Admitting: Family Medicine

## 2017-05-02 ENCOUNTER — Ambulatory Visit (INDEPENDENT_AMBULATORY_CARE_PROVIDER_SITE_OTHER): Payer: Medicare Other | Admitting: Family Medicine

## 2017-05-02 VITALS — BP 118/60 | HR 84 | Temp 98.1°F | Ht 59.0 in | Wt 111.0 lb

## 2017-05-02 DIAGNOSIS — E119 Type 2 diabetes mellitus without complications: Secondary | ICD-10-CM | POA: Diagnosis not present

## 2017-05-02 DIAGNOSIS — D649 Anemia, unspecified: Secondary | ICD-10-CM

## 2017-05-02 DIAGNOSIS — L89629 Pressure ulcer of left heel, unspecified stage: Secondary | ICD-10-CM

## 2017-05-02 DIAGNOSIS — I2782 Chronic pulmonary embolism: Secondary | ICD-10-CM

## 2017-05-02 DIAGNOSIS — I10 Essential (primary) hypertension: Secondary | ICD-10-CM | POA: Diagnosis not present

## 2017-05-02 DIAGNOSIS — D509 Iron deficiency anemia, unspecified: Secondary | ICD-10-CM | POA: Diagnosis not present

## 2017-05-02 LAB — POCT GLYCOSYLATED HEMOGLOBIN (HGB A1C): Hemoglobin A1C: 9.3

## 2017-05-02 MED ORDER — METFORMIN HCL 500 MG PO TABS: 500.0000 mg | ORAL_TABLET | Freq: Every day | ORAL | 0 refills | Status: DC

## 2017-05-02 NOTE — Patient Instructions (Signed)
It was a pleasure to care for you over the years.  STOP the Elquis.  Continue the Betadine baths on that left ankle.  Start using the heel boots at bedtime.   Start back taking metformin 500mg  daily.  We will check lab work today, if it is NOT normal I will call you. Otherwise, you'll get a letter in the mail.

## 2017-05-02 NOTE — Progress Notes (Signed)
Subjective: CC:L heel pain HPI: Patient is a 83 y.o. female with a past medical history of pulmonary embolism, mild dementia, gout, CAD, diastolic heart failure, hypertension, stroke, breast cancer in remission, colon cancer in remission, PND, and chronic pain presenting to clinic today for L hhel pain.  Patient had "scales on the R ankle previously that resolved with offloading pressure  2 weeks ago she got a bite on the back of the ankle on the L foot per her report, granddaughter does not think this is accurate Went to podiatry and "had fluid taken it off" Was diagnosed with Blister cellulitis left heel by them with the following plan: I&D blister posterior left heel apply antibiotic dressing Rx diluted Betadine soaks Rx cephalexin 500 mg by mouth twice a day 7 days: she has 2 doses left  Instructed patient how to offload posterior heels > pt does not recall this.  Since then the pt has intermittently been doing betadine soaks. She notes pain has improved but still present.  Denies any numbness or tingling of the feet.  H/o PE: I called the pharmacy after reading over notes and pt has only gotten approximately 2 months of Eliquis intermittently- states she doesn't take it b/c [facility] doesn't give it to her. No SOB, no unilateral LE swelling or pain. PE was in 08/2016.   DM: has been off metformin due to A1c being in the 6s previously.  Social History: lives with daughter [name], uses snuff   ROS: All other systems reviewed and are negative.  Past Medical History Patient Active Problem List   Diagnosis Date Noted  . Protein-calorie malnutrition, severe 02/06/2017  . Hypokalemia 10/13/2016  . Pressure injury of skin 08/31/2016  . Pulmonary embolus ([place]) 08/30/2016  . Mild dementia 06/01/2016  . Depression 01/20/2016  . Suicidal ideation 01/19/2016  . Prolapse of female pelvic organs 12/28/2015  . Leg weakness, bilateral 12/08/2015  . Weakness of right lower extremity  11/25/2015  . (HFpEF) heart failure with preserved ejection fraction ([place])   . CAD in native artery   . Gout 05/14/2015  . Dyslipidemia   . Essential hypertension   . Seasonal allergies 02/10/2015  . Stroke-like symptoms   . Stroke-like episode ([place]) 01/25/2015  . Syncope and collapse 01/25/2015  . Dizziness 09/19/2014  . Anemia of chronic illness 08/19/2014  . Acute superficial venous thrombosis of right lower extremity 08/19/2014  . Hypercalcemia 06/09/2014  . Breast cancer, left breast ([place]) 06/09/2014  . Breast cancer, right breast ([place]) 06/09/2014  . Edema of left lower extremity 03/30/2014  . PAD (peripheral artery disease) ([place]) 03/30/2014  . History of colon cancer 12/11/2013  . History of fall 05/15/2013  . Bilateral leg pain 03/07/2013  . Lower extremity edema 02/19/2013  . Stricture and stenosis of esophagus 10/27/2012  . Constipation 06/24/2012  . Thoracic back pain 05/24/2012  . Tobacco abuse 06/02/2011  . History of breast cancer in female, bilateral.  Mastectomies 10/24/2010. 04/26/2011  . Hiatal hernia 03/22/2011  . Colon polyp 03/22/2011  . Poor circulation 03/22/2011  . Cough 01/31/2011  . 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 Neuropathy ([place]) 08/25/2009  . ANEMIA-IRON DEFICIENCY 04/29/2007  . HLD (hyperlipidemia) 01/29/2007  . Controlled type 2 diabetes mellitus without complication, without long-term current use of insulin ([place]) 01/10/2007  . HYPERTENSION, BENIGN SYSTEMIC 01/10/2007  . Coronary atherosclerosis 01/10/2007  . Chronic systolic heart failure ([place]) 01/10/2007  . Asthma 01/10/2007  . Reflux esophagitis 01/10/2007  . Osteoarthritis, multiple sites 01/10/2007  Medications- reviewed and updated  Objective: Office vital signs reviewed. BP 118/60   Pulse 84   Temp 98.1 F (36.7 C) (Oral)   Ht '4\' 11"'$  (1.499 m)   Wt 111 lb (50.3 kg)   SpO2 98%   BMI 22.42 kg/m    Physical Examination:  General: Awake, alert, well- nourished,  NAD ENMT:  MMM, Oropharynx clear without erythema or tonsillar exudate/hypertrophy Eyes: Conjunctiva non-injected. Cardio: RRR, no m/r/g noted.  Pulm: No increased WOB.  CTAB, without wheezes, rhonchi or crackles noted.  MSK: in a wheelchair, requires significant assistance with getting in and out of chair and onto exam table..  Skin: 2 x 3cm area of loose skin at the site of blister drainage. No surrounding erythema or warmth   A1c 9.3  Assessment/Plan: Pressure injury of skin History and location are more consistent with pressure ulcer than bite.  Per podiatry note, may have been a cellulitis as well. Advised to complete antibiotics. Continue betadine soaks. Start using heel boots that we got her previously for the R foot. Follow up if she is not noting an improvement or if she notes worsening.   Controlled type 2 diabetes mellitus without complication, without long-term current use of insulin (HCC) A1c more elevated. Patient asymptomatic currently. Given open wound, would like better glycemic control to ensure appropriately healing. -add metformin '500mg'$  daily/ - pt to f/u in 3 months or sooner PRN  Pulmonary embolus (HCC) Greater than 6 months out. No current symptoms. Given non-compliance and time out from PE will D/c Eliquis. Discussed risk vs benefit with patient and granddaughter. Advised her to seek care if she has symptoms of DVT or PE.  ANEMIA-IRON DEFICIENCY Will check CBC   Orders Placed This Encounter  Procedures  . CMP14+EGFR  . CBC with Differential/Platelet  . POCT glycosylated hemoglobin (Hb A1C)    Meds ordered this encounter  Medications  . metFORMIN (GLUCOPHAGE) 500 MG tablet    Sig: Take 1 tablet (500 mg total) by mouth daily with breakfast.    Dispense:  90 tablet    Refill:  0    For questions regarding this prescription please call [phone].    [name] PGY-3, [place]

## 2017-05-03 LAB — CBC WITH DIFFERENTIAL/PLATELET
BASOS: 0 %
Basophils Absolute: 0 10*3/uL (ref 0.0–0.2)
EOS (ABSOLUTE): 0.1 10*3/uL (ref 0.0–0.4)
EOS: 3 %
HEMOGLOBIN: 9.7 g/dL — AB (ref 11.1–15.9)
Hematocrit: 34.2 % (ref 34.0–46.6)
IMMATURE GRANS (ABS): 0 10*3/uL (ref 0.0–0.1)
IMMATURE GRANULOCYTES: 0 %
LYMPHS: 30 %
Lymphocytes Absolute: 1.3 10*3/uL (ref 0.7–3.1)
MCH: 22 pg — AB (ref 26.6–33.0)
MCHC: 28.4 g/dL — ABNORMAL LOW (ref 31.5–35.7)
MCV: 78 fL — AB (ref 79–97)
MONOCYTES: 7 %
Monocytes Absolute: 0.3 10*3/uL (ref 0.1–0.9)
NEUTROS ABS: 2.6 10*3/uL (ref 1.4–7.0)
Neutrophils: 60 %
Platelets: 186 10*3/uL (ref 150–379)
RBC: 4.4 x10E6/uL (ref 3.77–5.28)
RDW: 18.4 % — ABNORMAL HIGH (ref 12.3–15.4)
WBC: 4.3 10*3/uL (ref 3.4–10.8)

## 2017-05-03 LAB — CMP14+EGFR
A/G RATIO: 1 — AB (ref 1.2–2.2)
ALT: 7 IU/L (ref 0–32)
AST: 9 IU/L (ref 0–40)
Albumin: 3.3 g/dL (ref 3.2–4.6)
Alkaline Phosphatase: 80 IU/L (ref 39–117)
BUN/Creatinine Ratio: 9 — ABNORMAL LOW (ref 12–28)
BUN: 10 mg/dL (ref 10–36)
Bilirubin Total: <0.2 mg/dL (ref 0.0–1.2)
CALCIUM: 10 mg/dL (ref 8.7–10.3)
CO2: 20 mmol/L (ref 20–29)
CREATININE: 1.14 mg/dL — AB (ref 0.57–1.00)
Chloride: 102 mmol/L (ref 96–106)
GFR, EST AFRICAN AMERICAN: 49 mL/min/{1.73_m2} — AB (ref >59)
GFR, EST NON AFRICAN AMERICAN: 42 mL/min/{1.73_m2} — AB (ref >59)
Globulin, Total: 3.3 g/dL (ref 1.5–4.5)
Glucose: 394 mg/dL — ABNORMAL HIGH (ref 65–99)
Potassium: 3.3 mmol/L — ABNORMAL LOW (ref 3.5–5.2)
Sodium: 140 mmol/L (ref 134–144)
Total Protein: 6.6 g/dL (ref 6.0–8.5)

## 2017-05-03 NOTE — Assessment & Plan Note (Signed)
Greater than 6 months out. No current symptoms. Given non-compliance and time out from PE will D/c Eliquis. Discussed risk vs benefit with patient and granddaughter. Advised her to seek care if she has symptoms of DVT or PE.

## 2017-05-03 NOTE — Assessment & Plan Note (Signed)
Will check CBC 

## 2017-05-03 NOTE — Assessment & Plan Note (Signed)
A1c more elevated. Patient asymptomatic currently. Given open wound, would like better glycemic control to ensure appropriately healing. -add metformin 500mg  daily/ - pt to f/u in 3 months or sooner PRN

## 2017-05-03 NOTE — Assessment & Plan Note (Signed)
History and location are more consistent with pressure ulcer than bite.  Per podiatry note, may have been a cellulitis as well. Advised to complete antibiotics. Continue betadine soaks. Start using heel boots that we got her previously for the R foot. Follow up if she is not noting an improvement or if she notes worsening.

## 2017-05-08 ENCOUNTER — Encounter: Payer: Self-pay | Admitting: Family Medicine

## 2017-05-10 ENCOUNTER — Ambulatory Visit (HOSPITAL_COMMUNITY)
Admission: EM | Admit: 2017-05-10 | Discharge: 2017-05-10 | Disposition: A | Discharge status: Home / Self Care | Payer: Medicare Other | Attending: Internal Medicine | Admitting: Internal Medicine

## 2017-05-10 ENCOUNTER — Encounter (HOSPITAL_COMMUNITY): Payer: Self-pay | Admitting: Emergency Medicine

## 2017-05-10 DIAGNOSIS — L97511 Non-pressure chronic ulcer of other part of right foot limited to breakdown of skin: Secondary | ICD-10-CM

## 2017-05-10 DIAGNOSIS — L97529 Non-pressure chronic ulcer of other part of left foot with unspecified severity: Secondary | ICD-10-CM

## 2017-05-10 MED ORDER — ZINC OXIDE 6 % EX CREA: 1.0000 "application " | TOPICAL_CREAM | Freq: Once | CUTANEOUS | 0 refills | Status: AC

## 2017-05-10 MED ORDER — "TEGADERM AG MESH 4""X5"" EX PADS": 1.0000 | MEDICATED_PAD | CUTANEOUS | 0 refills | Status: AC

## 2017-05-10 MED ORDER — ALOE VESTA 2-N-1 PROTECTIVE EX OINT: TOPICAL_OINTMENT | Freq: Two times a day (BID) | CUTANEOUS | 0 refills | Status: DC | PRN

## 2017-05-10 NOTE — ED Provider Notes (Signed)
CSN: [id number]     Arrival date & time 05/10/17  1214 History   None    Chief Complaint  Patient presents with  . Wound Check   (Consider location/radiation/quality/duration/timing/severity/associated sxs/prior Treatment) 83 yo female with PMH of CHF, DM, HTN, CAD comes in with daughter for bilateral heel ulcers. Patient was seen by her podiatrist her left foot ulcer, where she was treated with diluted Betadine soaks and Cephalexin '500mg'$  BID x 7 days. She was seen by her PCP 1 week ago for follow up, and had 2 doses left at that time. She was also instructed to offload posterior heels. Patient states about 5 days ago, right heel started hurting as well, and has been oozing. Denies fever, chills, night sweats. Denies redness, increased warmth. Patient with uncontrolled DM with recent a1c 9.3, she was put back on with metformin 1 week ago. Denies numbness, tingling. Denies shortness of breath, chest pain, wheezing.   Patient lives with one of her daughter (not present at this visit), and has a home health nurse come in in the morning. The home health moves patient from bed to chair in the room, where she stays in the chair for the rest of the day. Patient states that she is unable to move on her own, and unable to prop her feet up on her own.        Past Medical History:  Diagnosis Date  . Allergy   . Anemia    Iron def anemia  . Arthritis   . Asthma   . Breast cancer ([place]) 10/2010   Invasive Ductal Carcinoma, s/p bilateral mastectomy, now on Tamoxifen. Followed by Dr Jamse Arn.   . Breast cancer ([place]) 2011   s/p Bilateral masectomy  . CAD (coronary artery disease)   . Carotid artery aneurysm ([place]) 08/2010   right ICA, 5 x 92m  . CHF (congestive heart failure) (HCC)    Systolic   . Colon cancer ([place]   . Colon cancer ([place] 12/11/2013  . DDD (degenerative disc disease), cervical   . Diabetes mellitus   . Diverticulosis   . GERD (gastroesophageal reflux disease)   . H/O: GI bleed   .  Hiatal hernia   . History of colon cancer   . Hypercalcemia 06/09/2014  . Hyperlipidemia   . Hypertension   . Myocardial infarction ([place]   . Neuropathy   . Pneumonia 03/2012  . Shortness of breath    Past Surgical History:  Procedure Laterality Date  . Breast masectomy  10/2010   Bilateral masectomy by Dr NLucia Gaskinss/p invasive ductal carcinoma.  .Marland KitchenBREAST SURGERY    . Cardiac  Catherization  2007   Severe 3-vessel disease.  EF 20-25%.  . ESOPHAGOGASTRODUODENOSCOPY  2001   Esophageal Tear  . ESOPHAGOGASTRODUODENOSCOPY  10/27/2012   Procedure: ESOPHAGOGASTRODUODENOSCOPY (EGD);  Surgeon: JIrene Shipper MD;  [facility];  Service: Endoscopy;  Laterality: N/A;  pat  . JOINT REPLACEMENT Right 2001   Knee  . TOTAL KNEE ARTHROPLASTY  2001   Family History  Problem Relation Age of Onset  . Sickle cell anemia Other   . Heart disease Mother   . Heart disease Father   . Cancer Daughter 377      breast ca  . Hypertension Daughter   . Cancer Daughter 565      breast ca  . Hypertension Daughter   . Heart disease Son        Poor circulation-Left Leg  . Diabetes  Daughter   . Hypertension Daughter   . Hyperlipidemia Daughter        Poor circulation- Toe amputation   Social History  Substance Use Topics  . Smoking status: Never Smoker  . Smokeless tobacco: Current User    Types: Snuff  . Alcohol use No   OB History    No data available     Review of Systems  Constitutional: Negative for chills, diaphoresis, fatigue and fever.  Respiratory: Negative for cough, shortness of breath and wheezing.   Cardiovascular: Negative for chest pain and palpitations.  Skin: Positive for wound.    Allergies  Tape and Lisinopril  Home Medications   Prior to Admission medications   Medication Sig Start Date End Date Taking? Authorizing Provider  acetaminophen (TYLENOL) 500 MG tablet Take 500 mg by mouth every 6 (six) hours as needed for moderate pain.     [provider]   albuterol (PROVENTIL HFA;VENTOLIN HFA) 108 (90 BASE) MCG/ACT inhaler Inhale 2 puffs into the lungs every 6 (six) hours as needed. For shortness of breath Patient taking differently: Inhale 2 puffs into the lungs every 6 (six) hours as needed for shortness of breath.  01/18/15   [name], MD  Amino Acids-Protein Hydrolys (FEEDING SUPPLEMENT, PRO-STAT SUGAR FREE 64,) LIQD Take 30 mLs by mouth 2 (two) times daily with a meal.    [provider]  amLODipine (NORVASC) 5 MG tablet Take 1 tablet (5 mg total) by mouth daily. 11/21/16   [name], MD  aspirin (ASPIRIN CHILDRENS) 81 MG chewable tablet Chew 81 mg by mouth daily.     [provider]  benzonatate (TESSALON) 100 MG capsule Take 1 capsule (100 mg total) by mouth every 8 (eight) hours. 04/03/17   Lysbeth Penner, FNP  benzonatate (TESSALON) 200 MG capsule Take 1 capsule (200 mg total) by mouth 3 (three) times daily as needed for cough. 03/12/17   Verner Mould, MD  Blood Pressure KIT Use the blood pressure kit to check you blood pressure twice a day 08/27/15   [name], MD  capsaicin (ZOSTRIX) 0.025 % cream APPLY TO AFFECTED AREA TWICE A DAY 04/11/16   [name], MD  carvedilol (COREG) 12.5 MG tablet TAKE 1 TABLET (12.5 MG TOTAL) BY MOUTH 2 (TWO) TIMES DAILY WITH A MEAL. 09/18/16   [name], MD  cephALEXin (KEFLEX) 500 MG capsule Take 1 capsule (500 mg total) by mouth 2 (two) times daily. 04/25/17   Tuchman, Leslye Peer, DPM  clopidogrel (PLAVIX) 75 MG tablet Take 75 mg by mouth daily. 01/20/17   [provider]  clopidogrel (PLAVIX) 75 MG tablet TAKE 1 TABLET BY MOUTH EVERY DAY 04/16/17   [name], MD  diclofenac sodium (VOLTAREN) 1 % GEL APPLY 2 grams topically 4 TIMES DAILY AS NEEDED FOR PAIN 09/23/15   [name], MD  ferrous sulfate 325 (65 FE) MG tablet Take 1 tablet (325 mg total) by mouth daily with breakfast. 11/21/16   [name], MD  glucose blood  test strip Use as instructed Dx. Code 250.02 09/28/14   [name], MD  guaifenesin (ROBITUSSIN) 100 MG/5ML syrup Take 10 mLs (200 mg total) by mouth 3 (three) times daily as needed for cough. 08/15/16   [name], MD  loratadine (CLARITIN) 10 MG tablet TAKE 1/2 TABLET BY MOUTH DAILY 12/08/15   [name], MD  losartan (COZAAR) 50 MG tablet TAKE 1 TABLET (50  MG TOTAL) BY MOUTH AT BEDTIME. 11/21/16   [name], MD  Menthol, Topical Analgesic, (BIOFREEZE EX) Apply 1 application topically 2 (two) times daily as needed (pain).    [provider]  metFORMIN (GLUCOPHAGE) 500 MG tablet Take 1 tablet (500 mg total) by mouth daily with breakfast. 05/02/17   [name], MD  PATADAY 0.2 % SOLN APPLY 1 DROP TO EYE DAILY. Patient taking differently: Instill 1 drop into each eye once a day 11/23/15   [name], MD  petrolatum-hydrophilic-aloe vera (ALOE VESTA) ointment Apply topically 2 (two) times daily as needed for wound care. 05/10/17   Tasia Catchings, [place] V, PA-C  rosuvastatin (CRESTOR) 20 MG tablet Take 1 tablet (20 mg total) by mouth at bedtime. 01/08/17   Verner Mould, MD  senna-docusate (SENOKOT-S) 8.6-50 MG tablet Take 2 tablets by mouth at bedtime. 04/23/17   Bacigalupo, Dionne Bucy, MD  traMADol (ULTRAM) 50 MG tablet Take 1 tablet (50 mg total) by mouth every 8 (eight) hours as needed for severe pain. 04/23/17   Virginia Crews, MD  Wound Dressings (TEGADERM AG MESH 4"X5") PADS Apply 1 each topically 1 day or 1 dose. 05/10/17 05/11/17  Ok Edwards, PA-C  Zinc Oxide 6 % CREA Apply 1 application topically once. 05/10/17 05/10/17  Ok Edwards, PA-C   Meds Ordered and Administered this Visit  Medications - No data to display  BP (!) 150/60 (BP Location: Left Arm)   Pulse 82   Temp 98.7 F (37.1 C) (Oral)   Resp 20   SpO2 99%  No data found.   Physical Exam  Constitutional: She is oriented to person, place, and time. She appears well-developed and  well-nourished. No distress.  HENT:  Head: Normocephalic and atraumatic.  Eyes: Conjunctivae are normal. Pupils are equal, round, and reactive to light.  Cardiovascular: Normal rate and regular rhythm.  Exam reveals no gallop and no friction rub.   No murmur heard. Pulses:      Dorsalis pedis pulses are 2+ on the right side, and 2+ on the left side.  Pulmonary/Chest: Effort normal and breath sounds normal. No respiratory distress. She has no decreased breath sounds. She has no wheezes. She has no rhonchi. She has no rales.  Feet:  Right Foot:  Skin Integrity: Positive for ulcer (3-4 cm round skin breakdown. No surounding erythema or increased warmth) and skin breakdown.  Left Foot:  Skin Integrity: Positive for ulcer (3-4 cm round blister with small open drainage sit. No drainage noted during exam, no surrounding or increased warmth. ) and blister.  Neurological: She is alert and oriented to person, place, and time. No sensory deficit.  Skin: Skin is warm and dry. Capillary refill takes less than 2 seconds.  Psychiatric: She has a normal mood and affect. Her behavior is normal. Judgment normal.    Urgent Care Course     Procedures (including critical care time)  Labs Review Labs Reviewed - No data to display  Imaging Review No results found.      MDM   1. Skin ulcer of right foot, limited to breakdown of skin ([place])   2. Ulcer of left foot, unspecified ulcer stage ([place])    1. Long discussion with patient and family member on importance of good wound care and close follow up given patient's PMH and uncontrolled diabetes. Currently without signs of infection. Apply tegaderm, aloe vesta, zinc oxide for wound protection until follow up with PCP or podiatrist. Resources  for wound care center given. Discussed importance of diabetes control with wound healing, patient to restart monitoring blood glucose. Patient and family member needing more help taking care of patient, to follow up  with PCP to start process.    Ok Edwards, PA-C 05/10/17 1331

## 2017-05-10 NOTE — Discharge Instructions (Signed)
Follow up with PCP or foot doctor for wound care. Wound will need to be followed closely. Monitor for discharge, fever, redness, increased warmth. Keep pressure off the wound sites.

## 2017-05-10 NOTE — ED Triage Notes (Signed)
Patient and caregiver reports ulcers on feet.  Ulcers to bilateral heels.  Noticed wounds x 1 month

## 2017-05-15 ENCOUNTER — Ambulatory Visit (INDEPENDENT_AMBULATORY_CARE_PROVIDER_SITE_OTHER): Payer: Medicare Other | Admitting: Podiatry

## 2017-05-15 ENCOUNTER — Encounter: Payer: Self-pay | Admitting: Podiatry

## 2017-05-15 VITALS — Temp 97.6°F

## 2017-05-15 DIAGNOSIS — L97401 Non-pressure chronic ulcer of unspecified heel and midfoot limited to breakdown of skin: Secondary | ICD-10-CM

## 2017-05-15 DIAGNOSIS — I739 Peripheral vascular disease, unspecified: Secondary | ICD-10-CM

## 2017-05-15 MED ORDER — SILVER SULFADIAZINE 1 % EX CREA: 1.0000 "application " | TOPICAL_CREAM | Freq: Every day | CUTANEOUS | 0 refills | Status: DC

## 2017-05-15 MED ORDER — CEPHALEXIN 500 MG PO CAPS: 500.0000 mg | ORAL_CAPSULE | Freq: Two times a day (BID) | ORAL | 1 refills | Status: DC

## 2017-05-15 NOTE — Patient Instructions (Signed)
Apply Silvadene cream to skin ulcers are right and left feet daily and cover with gauze Continued diluted Betadine soaks daily 1-2 minutes Elevate leg to reduce pressure on heels by placing pillow under her Right and Left Legs Wear Heel Protectors at Night CAny Sudden Increase in Pain, Swelling, Redness, Fever Present to Emergency Department  Diabetes and Foot Care Diabetes may cause you to have problems because of poor blood supply (circulation) to your feet and legs. This may cause the skin on your feet to become thinner, break easier, and heal more slowly. Your skin may become dry, and the skin may peel and crack. You may also have nerve damage in your legs and feet causing decreased feeling in them. You may not notice minor injuries to your feet that could lead to infections or more serious problems. Taking care of your feet is one of the most important things you can do for yourself. Follow these instructions at home:  Wear shoes at all times, even in the house. Do not go barefoot. Bare feet are easily injured.  Check your feet daily for blisters, cuts, and redness. If you cannot see the bottom of your feet, use a mirror or ask someone for help.  Wash your feet with warm water (do not use hot water) and mild soap. Then pat your feet and the areas between your toes until they are completely dry. Do not soak your feet as this can dry your skin.  Apply a moisturizing lotion or petroleum jelly (that does not contain alcohol and is unscented) to the skin on your feet and to dry, brittle toenails. Do not apply lotion between your toes.  Trim your toenails straight across. Do not dig under them or around the cuticle. File the edges of your nails with an emery board or nail file.  Do not cut corns or calluses or try to remove them with medicine.  Wear clean socks or stockings every day. Make sure they are not too tight. Do not wear knee-high stockings since they may decrease blood flow to  your legs.  Wear shoes that fit properly and have enough cushioning. To break in new shoes, wear them for just a few hours a day. This prevents you from injuring your feet. Always look in your shoes before you put them on to be sure there are no objects inside.  Do not cross your legs. This may decrease the blood flow to your feet.  If you find a minor scrape, cut, or break in the skin on your feet, keep it and the skin around it clean and dry. These areas may be cleansed with mild soap and water. Do not cleanse the area with peroxide, alcohol, or iodine.  When you remove an adhesive bandage, be sure not to damage the skin around it.  If you have a wound, look at it several times a day to make sure it is healing.  Do not use heating pads or hot water bottles. They may burn your skin. If you have lost feeling in your feet or legs, you may not know it is happening until it is too late.  Make sure your health care provider performs a complete foot exam at least annually or more often if you have foot problems. Report any cuts, sores, or bruises to your health care provider immediately. Contact a health care provider if:  You have an injury that is not healing.  You have cuts or breaks in the skin.  You have an ingrown nail.  You notice redness on your legs or feet.  You feel burning or tingling in your legs or feet.  You have pain or cramps in your legs and feet.  Your legs or feet are numb.  Your feet always feel cold. Get help right away if:  There is increasing redness, swelling, or pain in or around a wound.  There is a red line that goes up your leg.  Pus is coming from a wound.  You develop a fever or as directed by your health care provider.  You notice a bad smell coming from an ulcer or wound. This information is not intended to replace advice given to you by your health care provider. Make sure you discuss any questions you have with your health care  provider. Document Released: 10/27/2000 Document Revised: 04/06/2016 Document Reviewed: 04/08/2013 Elsevier Interactive Patient Education  2017 [employer].

## 2017-05-16 NOTE — Progress Notes (Signed)
Patient ID: [name], female   DOB: 1923/11/30, 83 y.o.   MRN: [id number]   Subjective: This patient presents today with her daughter present in the treatment room complaining of painful blisters/ulcerations in the heels left greater than right. Patient was last evaluated for this problem on 04/25/2017. At that time blister/cellulitis formations were noted primarily left and blister formation right without cellulitis. Diluted Betadine soaks prescribed and cephalexin 500 mg by mouth twice a day prescribed. Patient has completed antibiotics and denies any complaints from the antibiotics. Patient is complaining of pain in the left heel primarily. Since the visit of 04/25/2017 patient describes going to urgent care and was advised to apply diaper rash cream to the ulcers and return to podiatry for follow-up. Patient was instructed to elevate heels on pillows and wear heel protectors. Patient's daughter states that her mother is not consistent in elevating legs were wearing heel protectors. The patient appears somewhat confused and her daughter seems to correct her response to questioning  Chart review: Urgent care visit dated 05/10/2017 Summary of visit  1. Skin ulcer of right foot, limited to breakdown of skin ([place])   2. Ulcer of left foot, unspecified ulcer stage ([place])    1. Long discussion with patient and family member on importance of good wound care and close follow up given patient's PMH and uncontrolled diabetes. Currently without signs of infection. Apply tegaderm, aloe vesta, zinc oxide for wound protection until follow up with PCP or podiatrist. Resources for wound care center given. Discussed importance of diabetes control with wound healing, patient to restart monitoring blood glucose. Patient and family member needing more help taking care of patient, to follow up with PCP to start process.    Ok Edwards, PA-C 05/10/17 1331     Objective: Peripheral pitting edema bilaterally: DP  and PT pulses trace palpable bilaterally Capillary reflex delayed bilaterally Sensation to 10 g monofilament wire intact 5/5 bilaterally Vibratory sensation nonreactive bilaterally Ankle reflex reactive bilaterally Atrophic skin bilaterally with absent hair growth bilaterally Posterior right heel has 10 mm eschar without any surrounding erythema, edema or drainage Posterior left heel 20 mm full dermal ulceration with granular base with serous drainage noted. There is no malodor or erythema or warmth surrounding the wound Dorsi flexion right 5/5 right and 4/5 left Plantar flexion 5/5 bilaterally Patient walks slowly with walker   Assessment: Diabetic with peripheral arterial disease and peripheral neuropathy Eschar posterior right heel without clinical sign of infection Superficial ulcer posterior left heel   Plan: DC zinc oxide ointment Debride periphery of ulcer left heel Apply Silvadene dressing to heel Maintain diluted Betadine soaks left foot daily Refill cephalexin 500 mg by mouth be 7 days Discuss offloading of feet with daughter elevating on pillows and wearing heel protectors at night Instructed patient that if hesonincreaseofpain,swelling,redness,feverpresenttotheemergency department If there is no improvement at next visit consider referral to wound care center  Reappoint7days

## 2017-05-30 ENCOUNTER — Ambulatory Visit (INDEPENDENT_AMBULATORY_CARE_PROVIDER_SITE_OTHER): Payer: Medicare Other | Admitting: Podiatry

## 2017-05-30 ENCOUNTER — Other Ambulatory Visit: Payer: Self-pay | Admitting: *Deleted

## 2017-05-30 ENCOUNTER — Other Ambulatory Visit: Payer: Self-pay | Admitting: Family Medicine

## 2017-05-30 DIAGNOSIS — L97401 Non-pressure chronic ulcer of unspecified heel and midfoot limited to breakdown of skin: Secondary | ICD-10-CM | POA: Diagnosis not present

## 2017-05-30 DIAGNOSIS — I739 Peripheral vascular disease, unspecified: Secondary | ICD-10-CM

## 2017-05-30 DIAGNOSIS — E1151 Type 2 diabetes mellitus with diabetic peripheral angiopathy without gangrene: Secondary | ICD-10-CM

## 2017-05-30 MED ORDER — CARVEDILOL 12.5 MG PO TABS: 12.5000 mg | ORAL_TABLET | Freq: Two times a day (BID) | ORAL | 6 refills | Status: DC

## 2017-05-30 NOTE — Telephone Encounter (Signed)
Discussed with patient and daughter. In agreement to not restart all requests due to recommendation to discontinue by Dr. Lorenso Courier at previous visit. Patient without symptoms of PE or DVT.

## 2017-05-30 NOTE — Patient Instructions (Signed)
Apply Silvadene cream daily to the skin ulcers on the right and left heels and cover with gauze Discontinue the use of Betadine soaks Elevate legs on pillows to offload heels right and left  Diabetes and Foot Care Diabetes may cause you to have problems because of poor blood supply (circulation) to your feet and legs. This may cause the skin on your feet to become thinner, break easier, and heal more slowly. Your skin may become dry, and the skin may peel and crack. You may also have nerve damage in your legs and feet causing decreased feeling in them. You may not notice minor injuries to your feet that could lead to infections or more serious problems. Taking care of your feet is one of the most important things you can do for yourself. Follow these instructions at home:  Wear shoes at all times, even in the house. Do not go barefoot. Bare feet are easily injured.  Check your feet daily for blisters, cuts, and redness. If you cannot see the bottom of your feet, use a mirror or ask someone for help.  Wash your feet with warm water (do not use hot water) and mild soap. Then pat your feet and the areas between your toes until they are completely dry. Do not soak your feet as this can dry your skin.  Apply a moisturizing lotion or petroleum jelly (that does not contain alcohol and is unscented) to the skin on your feet and to dry, brittle toenails. Do not apply lotion between your toes.  Trim your toenails straight across. Do not dig under them or around the cuticle. File the edges of your nails with an emery board or nail file.  Do not cut corns or calluses or try to remove them with medicine.  Wear clean socks or stockings every day. Make sure they are not too tight. Do not wear knee-high stockings since they may decrease blood flow to your legs.  Wear shoes that fit properly and have enough cushioning. To break in new shoes, wear them for just a few hours a day. This prevents you from injuring  your feet. Always look in your shoes before you put them on to be sure there are no objects inside.  Do not cross your legs. This may decrease the blood flow to your feet.  If you find a minor scrape, cut, or break in the skin on your feet, keep it and the skin around it clean and dry. These areas may be cleansed with mild soap and water. Do not cleanse the area with peroxide, alcohol, or iodine.  When you remove an adhesive bandage, be sure not to damage the skin around it.  If you have a wound, look at it several times a day to make sure it is healing.  Do not use heating pads or hot water bottles. They may burn your skin. If you have lost feeling in your feet or legs, you may not know it is happening until it is too late.  Make sure your health care provider performs a complete foot exam at least annually or more often if you have foot problems. Report any cuts, sores, or bruises to your health care provider immediately. Contact a health care provider if:  You have an injury that is not healing.  You have cuts or breaks in the skin.  You have an ingrown nail.  You notice redness on your legs or feet.  You feel burning or tingling in your legs  or feet.  You have pain or cramps in your legs and feet.  Your legs or feet are numb.  Your feet always feel cold. Get help right away if:  There is increasing redness, swelling, or pain in or around a wound.  There is a red line that goes up your leg.  Pus is coming from a wound.  You develop a fever or as directed by your health care provider.  You notice a bad smell coming from an ulcer or wound. This information is not intended to replace advice given to you by your health care provider. Make sure you discuss any questions you have with your health care provider. Document Released: 10/27/2000 Document Revised: 04/06/2016 Document Reviewed: 04/08/2013 Elsevier Interactive Patient Education  2017 [employer].

## 2017-05-30 NOTE — Progress Notes (Signed)
Patient ID: [name], female   DOB: Nov 02, 1924, 83 y.o.   MRN: [id number]    Subjective: This patient presents today with her daughter present in the treatment room complaining of painful blisters/ulcerations in the heels left greater than right. Patient was last evaluated for this problem on 04/25/2017. At that time blister/cellulitis formations were noted primarily left and blister formation right without cellulitis. Diluted Betadine soaks prescribed and cephalexin 500 mg by mouth twice a day prescribed. Patient has completed antibiotics and denies any complaints from the antibiotics. Patient is complaining of pain in the left heel primarily. Since the visit of 04/25/2017 patient describes going to urgent care and was advised to apply diaper rash cream to the ulcers and return to podiatry for follow-up. Patient was instructed to elevate heels on pillows and wear heel protectors. Patient's daughter states that her mother is not consistent in elevating legs were wearing heel protectors. The patient states that she's not having any further pain and denies any complaint from the cephalexin  Chart review: Urgent care visit dated 05/10/2017 Summary of visit  1. Skin ulcer of right foot, limited to breakdown of skin ([place])   2. Ulcer of left foot, unspecified ulcer stage ([place])    1. Long discussion with patient and family member on importance of good wound care and close follow up given patient's PMH and uncontrolled diabetes. Currently without signs of infection. Apply tegaderm, aloe vesta, zinc oxide for wound protection until follow up with PCP or podiatrist. Resources for wound care center given. Discussed importance of diabetes control with wound healing, patient to restart monitoring blood glucose. Patient and family member needing more help taking care of patient, to follow up with PCP to start process.   Ok Edwards, PA-C 05/10/17 1331     Objective: Peripheral pitting edema  bilaterally: DP and PT pulses trace palpable bilaterally Capillary reflex delayed bilaterally Sensation to 10 g monofilament wire intact 5/5 bilaterally Vibratory sensation nonreactive bilaterally Ankle reflex reactive bilaterally Atrophic skin bilaterally with absent hair growth bilaterally Posterior right heel has 10 x 25 mm  superficial ulcer with granular base. No surrounding erythema, edema or active drainage Posterior left heel 15 x10 mm full dermal ulceration with granular base with serous drainage noted. There is no malodor or erythema or warmth surrounding the wound Dorsi flexion right 5/5 right and 4/5 left Plantar flexion 5/5 bilaterally Patient walks slowly with walker   Assessment: Diabetic with peripheral arterial disease and peripheral neuropathy Superficial ulcers heels bilaterally without clinical sign of infection    Plan:  Debride heel ulcers 2 Apply Silvadene dressing to ulcers Discontinue Betadine soaks left foot daily Do not Refill cephalexin 500 mg by mouth be 7 days Discuss offloading of feet with daughter elevating on pillows and wearing heel protectors at night   Reappoint 7 days

## 2017-06-06 ENCOUNTER — Encounter: Payer: Self-pay | Admitting: Podiatry

## 2017-06-06 ENCOUNTER — Ambulatory Visit (INDEPENDENT_AMBULATORY_CARE_PROVIDER_SITE_OTHER): Payer: Medicare Other | Admitting: Podiatry

## 2017-06-06 VITALS — BP 154/66 | HR 86 | Temp 98.6°F | Resp 18

## 2017-06-06 DIAGNOSIS — I739 Peripheral vascular disease, unspecified: Secondary | ICD-10-CM

## 2017-06-06 DIAGNOSIS — L97401 Non-pressure chronic ulcer of unspecified heel and midfoot limited to breakdown of skin: Secondary | ICD-10-CM

## 2017-06-06 NOTE — Progress Notes (Signed)
Patient ID: [name], female   DOB: 05/28/1924, 83 y.o.   MRN: [id number]    Subjective: At this time patient describes minimal occasional discomfort in the bilateral heel ulcers  HPI This patient presents  with her daughter initially presented in the treatment room complaining of painful blisters/ulcerations in the heels left greater than right. Patient was last evaluated for this problem on 04/25/2017. At that time blister/cellulitis formations were noted primarily left and blister formation right without cellulitis. Diluted Betadine soaks prescribed and cephalexin 500 mg by mouth twice a day prescribed. Patient has completed antibiotics and denies any complaints from the antibiotics. Patient is complaining of pain in the left heel primarily. Since the visit of 04/25/2017 patient describes going to urgent care and was advised to apply diaper rash cream to the ulcers and return to podiatry for follow-up. Patient was instructed to elevate heels on pillows and wear heel protectors. Patient's daughter states that her mother is not consistent in elevating legs were wearing heel protectors. Patient was instructed to DC cephalexin on the visit of 05/30/2017.      Objective: Peripheral pitting edema bilaterally: DP and PT pulses trace palpable bilaterally Capillary reflex delayed bilaterally Sensation to 10 g monofilament wire intact 5/5 bilaterally Vibratory sensation nonreactive bilaterally Ankle reflex reactive bilaterally Atrophic skin bilaterally with absent hair growth bilaterally Posterior right heel has 20 x86mm (previous 10 x 25 mm) superficial ulcer with granular base. No surrounding erythema, edema or active drainage. Minimal scaling surrounding the ulcer on the right heel. Posterior left heel  10 x 5 mm( previous 15 x10 mm )full dermal ulceration with granular base . There is no malodor or erythema or warmth surrounding the wound Dorsi flexion right 5/5 right and 4/5 left Plantar  flexion 5/5 bilaterally Patient walks slowly with walker    Assessment: Diabetic with peripheral arterial disease and peripheral neuropathy Superficial ulcers heels bilaterally without clinical sign of infection, reducing in size   Plan:  Debride heel ulcers 2 Apply Silvadene dressing to ulcers Discontinue Betadine soaks left foot daily Recommend diluted soft soap or antibacterials soaks daily  Discuss offloading of feet with daughter elevating on pillows and wearing heel protectors at night    If patient notices any sudden increase in pain, swelling, redness, fever to present to emergency department  Wounds are stable reappoint 2 weeks

## 2017-06-06 NOTE — Patient Instructions (Signed)
Soak right and left heel ulcers daily with antibacterial soft soap placing approximately 1 tablespoon per quarter tap water and Soak to 3 minutes daily Apply Silvadene cream daily to skin ulcers and cover with gauze and attach with Coflex If you notice any sudden increased pain, swelling, redness, fever presents emergency department   Diabetes and Foot Care Diabetes may cause you to have problems because of poor blood supply (circulation) to your feet and legs. This may cause the skin on your feet to become thinner, break easier, and heal more slowly. Your skin may become dry, and the skin may peel and crack. You may also have nerve damage in your legs and feet causing decreased feeling in them. You may not notice minor injuries to your feet that could lead to infections or more serious problems. Taking care of your feet is one of the most important things you can do for yourself. Follow these instructions at home:  Wear shoes at all times, even in the house. Do not go barefoot. Bare feet are easily injured.  Check your feet daily for blisters, cuts, and redness. If you cannot see the bottom of your feet, use a mirror or ask someone for help.  Wash your feet with warm water (do not use hot water) and mild soap. Then pat your feet and the areas between your toes until they are completely dry. Do not soak your feet as this can dry your skin.  Apply a moisturizing lotion or petroleum jelly (that does not contain alcohol and is unscented) to the skin on your feet and to dry, brittle toenails. Do not apply lotion between your toes.  Trim your toenails straight across. Do not dig under them or around the cuticle. File the edges of your nails with an emery board or nail file.  Do not cut corns or calluses or try to remove them with medicine.  Wear clean socks or stockings every day. Make sure they are not too tight. Do not wear knee-high stockings since they may decrease blood flow to your  legs.  Wear shoes that fit properly and have enough cushioning. To break in new shoes, wear them for just a few hours a day. This prevents you from injuring your feet. Always look in your shoes before you put them on to be sure there are no objects inside.  Do not cross your legs. This may decrease the blood flow to your feet.  If you find a minor scrape, cut, or break in the skin on your feet, keep it and the skin around it clean and dry. These areas may be cleansed with mild soap and water. Do not cleanse the area with peroxide, alcohol, or iodine.  When you remove an adhesive bandage, be sure not to damage the skin around it.  If you have a wound, look at it several times a day to make sure it is healing.  Do not use heating pads or hot water bottles. They may burn your skin. If you have lost feeling in your feet or legs, you may not know it is happening until it is too late.  Make sure your health care provider performs a complete foot exam at least annually or more often if you have foot problems. Report any cuts, sores, or bruises to your health care provider immediately. Contact a health care provider if:  You have an injury that is not healing.  You have cuts or breaks in the skin.  You have an  ingrown nail.  You notice redness on your legs or feet.  You feel burning or tingling in your legs or feet.  You have pain or cramps in your legs and feet.  Your legs or feet are numb.  Your feet always feel cold. Get help right away if:  There is increasing redness, swelling, or pain in or around a wound.  There is a red line that goes up your leg.  Pus is coming from a wound.  You develop a fever or as directed by your health care provider.  You notice a bad smell coming from an ulcer or wound. This information is not intended to replace advice given to you by your health care provider. Make sure you discuss any questions you have with your health care provider. Document  Released: 10/27/2000 Document Revised: 04/06/2016 Document Reviewed: 04/08/2013 Elsevier Interactive Patient Education  2017 [employer].

## 2017-06-14 ENCOUNTER — Ambulatory Visit (INDEPENDENT_AMBULATORY_CARE_PROVIDER_SITE_OTHER): Payer: Medicare Other | Admitting: Family Medicine

## 2017-06-14 VITALS — BP 120/58 | HR 92 | Temp 98.7°F | Ht 59.0 in | Wt 111.2 lb

## 2017-06-14 DIAGNOSIS — R29810 Facial weakness: Secondary | ICD-10-CM | POA: Diagnosis present

## 2017-06-14 DIAGNOSIS — M79604 Pain in right leg: Secondary | ICD-10-CM | POA: Diagnosis not present

## 2017-06-14 DIAGNOSIS — M79605 Pain in left leg: Secondary | ICD-10-CM | POA: Diagnosis not present

## 2017-06-14 NOTE — Progress Notes (Signed)
    Subjective:    Patient ID: [name], female    DOB: 02/18/1924, 83 y.o.   MRN: [id number]   CC: "feel sick"  Patient unable to pin down what is wrong. Reports facial droop that she noticed yesterday on the right. It is better today. Denies weakness on left or right side. Denies falls, hitting head. She denies fevers, chills, confusion, difficulty speaking. No CP or SOB  She also endorses bilateral foot pain that has been going on for several weeks. She has ulcers on her feet that are painful to her and make it difficult to walk, she had to use a wheelchair to get into clinic today.   Smoking status reviewed- uses snuff daily Lives with your [name]  Review of Systems- see HPI   Objective:  BP (!) 120/58 (BP Location: Right Arm, Patient Position: Sitting, Cuff Size: Normal)   Pulse 92   Temp 98.7 F (37.1 C) (Oral)   Ht 4\' 11"  (1.499 m)   Wt 111 lb 3.2 oz (50.4 kg)   SpO2 96%   BMI 22.46 kg/m  Vitals and nursing note reviewed  General: elderly frail lady in no acute distress HEENT: normocephalic, face appears symmetric, symmetric palate rise Cardiac: RRR, clear S1 and S2, no murmurs, rubs, or gallops Respiratory: clear to auscultation bilaterally, no increased work of breathing Extremities: bilateral pitting edema up to knees, healing ulcer without discharge or erythema on heels bilaterally, banadaged. No warmth. Neuro: alert and oriented, no focal deficits, strength 5/5 in upper and lower extremities bilaterally. Decreased sensation on right side of face. Normal muscle innervation to face bilaterally. Other cranial nerves in tact.   Assessment & Plan:   Right facial droop- resolved, decreased sensation on right side of face, other cranial nerves in tact, strength 5/5 bilaterally  -recommended to go to ED if symptoms return -recommended reviewing medications with patient's daughter (not present) who is primary caregiver at home -follow up with PCP    Bilateral leg pain  Secondary to healing ulcers on feet bilaterally  -no signs of infection -advised patient to keep legs elevated -avoid pressure on heels of feet -tramadol/tylenol for pain -follow up with foot/ankle specialist 8/8 as scheduled   Return if symptoms worsen or fail to improve.   Lucila Maine, DO Family Medicine Resident PGY-2

## 2017-06-14 NOTE — Patient Instructions (Signed)
  Please take tylenol and tramadol for your pain. Keep your feet elevated to help with the swelling. Keep the pressure off your heels to help them heal.  Please review the medication list and make sure your mom is getting the medications she is supposed to be getting.  If you have questions or concerns please do not hesitate to call at [phone].  Lucila Maine, DO PGY-2, [facility] 06/14/2017 4:14 PM

## 2017-06-14 NOTE — Assessment & Plan Note (Signed)
  Secondary to healing ulcers on feet bilaterally  -no signs of infection -advised patient to keep legs elevated -avoid pressure on heels of feet -tramadol/tylenol for pain -follow up with foot/ankle specialist 8/8 as scheduled

## 2017-06-18 ENCOUNTER — Telehealth: Payer: Self-pay | Admitting: Family Medicine

## 2017-06-18 NOTE — Telephone Encounter (Signed)
I'm covering Dr. Ericka Pontiff inbox while he is out of office. Returned call to [name] to discuss, would appreciate help from CSW to arrange this. Explained to [name] there are some forms to fill out to get process going. [name] has Medicare, she is unsure what plans specifically.   She states [name] mom is in good spirits and good health however the amount of care she is requiring is too much for [name] other daughter who is [name] primary caregiver. She knows it is time to start looking into other care facilities.   [name] is POA, [name] along with [name] other siblings are all in agreement this is the best next step. Will follow up with [name] tomorrow morning. Nothing urgent needed at this time.

## 2017-06-18 NOTE — Telephone Encounter (Signed)
[name] states they believe it is time to put pt in a nursing home. [name] was very tearful. She would like someone to call her to get this process started and wants to know what facilities we recommend. ep

## 2017-06-20 ENCOUNTER — Ambulatory Visit (INDEPENDENT_AMBULATORY_CARE_PROVIDER_SITE_OTHER): Payer: Medicare Other | Admitting: Podiatry

## 2017-06-20 NOTE — Progress Notes (Signed)
Social work consult from Dr. Lyndal Pulley ref. patient's daughter asking for assistance with placement.  Called patient's daughter [name], [phone]. Left message that LCSW was refer by Dr. Lyndal Pulley and asked her to call LCSW.  Plan: LCSW will wait for return call.  Will f/u if no return call is received in 3 to 5 business days.  Casimer Lanius, LCSW Licensed Clinical Social Worker [place]   [phone] 1:33 PM

## 2017-06-20 NOTE — Progress Notes (Signed)
Erroneous encounter no show  

## 2017-06-20 NOTE — Progress Notes (Signed)
Patient ID: [name], female   DOB: Aug 12, 1924, 83 y.o.   MRN: [id number]

## 2017-06-22 NOTE — Progress Notes (Signed)
F/U call to patient's daughter [name] [phone] to assist with placement.    Per [name] they have decided not to move forward with placement.  LCSW reviewed the placement process with [name] so that she and the family would have a better idea of the process when they decided to move forward.  [name] thanks LCSW for the call.    Casimer Lanius, LCSW Licensed Clinical Social Worker [facility]   [phone] 9:26 AM

## 2017-06-25 ENCOUNTER — Other Ambulatory Visit: Payer: Self-pay | Admitting: *Deleted

## 2017-06-25 MED ORDER — LOSARTAN POTASSIUM 50 MG PO TABS: ORAL_TABLET | ORAL | 6 refills | Status: DC

## 2017-06-25 MED ORDER — AMLODIPINE BESYLATE 5 MG PO TABS: 5.0000 mg | ORAL_TABLET | Freq: Every day | ORAL | 6 refills | Status: DC

## 2017-07-09 ENCOUNTER — Encounter: Payer: Self-pay | Admitting: Podiatry

## 2017-07-09 ENCOUNTER — Ambulatory Visit: Payer: Medicare Other | Admitting: Podiatry

## 2017-07-09 VITALS — BP 157/70 | Temp 98.2°F

## 2017-07-09 DIAGNOSIS — I739 Peripheral vascular disease, unspecified: Secondary | ICD-10-CM

## 2017-07-09 DIAGNOSIS — L97401 Non-pressure chronic ulcer of unspecified heel and midfoot limited to breakdown of skin: Secondary | ICD-10-CM

## 2017-07-09 NOTE — Progress Notes (Signed)
Patient ID: [name], female   DOB: [birth year], 83 y.o.   MRN: [id number]   Subjective: At this time patient describes minimal occasional discomfort in the bilateral heel ulcers  HPI This patient presents  with her daughter initially presented in the treatment room complaining of painful blisters/ulcerations in the heels left greater than right. Patient was last evaluated for this problem on 04/25/2017. At that time blister/cellulitis formations were noted primarily left and blister formation right without cellulitis. Diluted Betadine soaks prescribed and cephalexin 500 mg by mouth twice a day prescribed. Patient has completed antibiotics and denies any complaints from the antibiotics. Patient is complaining of pain in the left heel primarily. Since the visit of 04/25/2017 patient describes going to urgent care and was advised to apply diaper rash cream to the ulcers and return to podiatry for follow-up. Patient was instructed to elevate heels on pillows and wear heel protectors. Patient's daughter states that her mother is not consistent in elevating legs were wearing heel protectors. Patient was instructed to DC cephalexin on the visit of 05/30/2017.      Objective: Peripheral pitting edema bilaterally: DP and PT pulses trace palpable bilaterally Capillary reflex delayed bilaterally Sensation to 10 g monofilament wire intact 5/5 bilaterally Vibratory sensation nonreactive bilaterally Ankle reflex reactive bilaterally Atrophic skin bilaterally with absent hair growth bilaterally Posterior right heel has 4 x 2 mm mm (previous 10 x 65mm)superficial ulcer with granular base. No surrounding erythema, edema or active drainage. Minimal scaling surrounding the ulcer on the right heel. Posterior left heel  ( 5 x 8 mm (previous 15 x43mm )full dermal ulceration with granular base . There is no malodor or erythema or warmth surrounding the wound Dorsi flexion right 5/5 right and 4/5  left Plantar flexion 5/5 bilaterally Patient walks slowly with walker    Assessment: Diabetic with peripheral arterial disease and peripheral neuropathy Superficial ulcers heels bilaterally without clinical sign of infection, reducing in size   Plan:  Debride heel ulcers 2 Apply Silvadene dressing to ulcers Discontinue Betadine soaks left foot daily Recommend diluted soft soap or antibacterials soaks daily  Discuss offloading of feet with daughter elevating on pillows and wearing heel protectors at night   If patient notices any sudden increase in pain, swelling, redness, fever to present to emergency department  Wounds are stable reappoint 2 weeks

## 2017-07-09 NOTE — Patient Instructions (Signed)
Apply Silvadene cream and gauze dressings to heel ulcers and right and left feet. Change the dressings every 1-3 days. If you're in a seated position place a block of foam or a soft pillow on the floor to offload the heels If you notice any sudden increase in pain, swelling, redness, fever presents to emergency department  Diabetes and Foot Care Diabetes may cause you to have problems because of poor blood supply (circulation) to your feet and legs. This may cause the skin on your feet to become thinner, break easier, and heal more slowly. Your skin may become dry, and the skin may peel and crack. You may also have nerve damage in your legs and feet causing decreased feeling in them. You may not notice minor injuries to your feet that could lead to infections or more serious problems. Taking care of your feet is one of the most important things you can do for yourself. Follow these instructions at home:  Wear shoes at all times, even in the house. Do not go barefoot. Bare feet are easily injured.  Check your feet daily for blisters, cuts, and redness. If you cannot see the bottom of your feet, use a mirror or ask someone for help.  Wash your feet with warm water (do not use hot water) and mild soap. Then pat your feet and the areas between your toes until they are completely dry. Do not soak your feet as this can dry your skin.  Apply a moisturizing lotion or petroleum jelly (that does not contain alcohol and is unscented) to the skin on your feet and to dry, brittle toenails. Do not apply lotion between your toes.  Trim your toenails straight across. Do not dig under them or around the cuticle. File the edges of your nails with an emery board or nail file.  Do not cut corns or calluses or try to remove them with medicine.  Wear clean socks or stockings every day. Make sure they are not too tight. Do not wear knee-high stockings since they may decrease blood flow to your legs.  Wear shoes  that fit properly and have enough cushioning. To break in new shoes, wear them for just a few hours a day. This prevents you from injuring your feet. Always look in your shoes before you put them on to be sure there are no objects inside.  Do not cross your legs. This may decrease the blood flow to your feet.  If you find a minor scrape, cut, or break in the skin on your feet, keep it and the skin around it clean and dry. These areas may be cleansed with mild soap and water. Do not cleanse the area with peroxide, alcohol, or iodine.  When you remove an adhesive bandage, be sure not to damage the skin around it.  If you have a wound, look at it several times a day to make sure it is healing.  Do not use heating pads or hot water bottles. They may burn your skin. If you have lost feeling in your feet or legs, you may not know it is happening until it is too late.  Make sure your health care provider performs a complete foot exam at least annually or more often if you have foot problems. Report any cuts, sores, or bruises to your health care provider immediately. Contact a health care provider if:  You have an injury that is not healing.  You have cuts or breaks in the skin.  You have an ingrown nail.  You notice redness on your legs or feet.  You feel burning or tingling in your legs or feet.  You have pain or cramps in your legs and feet.  Your legs or feet are numb.  Your feet always feel cold. Get help right away if:  There is increasing redness, swelling, or pain in or around a wound.  There is a red line that goes up your leg.  Pus is coming from a wound.  You develop a fever or as directed by your health care provider.  You notice a bad smell coming from an ulcer or wound. This information is not intended to replace advice given to you by your health care provider. Make sure you discuss any questions you have with your health care provider. Document Released: 10/27/2000  Document Revised: 04/06/2016 Document Reviewed: 04/08/2013 Elsevier Interactive Patient Education  2017 [employer].

## 2017-07-23 ENCOUNTER — Ambulatory Visit: Payer: Medicare Other | Admitting: Podiatry

## 2017-07-30 ENCOUNTER — Ambulatory Visit: Payer: Medicare Other | Admitting: Podiatry

## 2017-07-31 ENCOUNTER — Other Ambulatory Visit: Payer: Self-pay | Admitting: *Deleted

## 2017-07-31 MED ORDER — METFORMIN HCL 500 MG PO TABS: 500.0000 mg | ORAL_TABLET | Freq: Every day | ORAL | 3 refills | Status: DC

## 2017-08-01 ENCOUNTER — Ambulatory Visit: Payer: Medicare Other | Admitting: Podiatry

## 2017-08-09 ENCOUNTER — Ambulatory Visit (INDEPENDENT_AMBULATORY_CARE_PROVIDER_SITE_OTHER): Payer: Medicare Other | Admitting: Family Medicine

## 2017-08-09 ENCOUNTER — Encounter: Payer: Self-pay | Admitting: Family Medicine

## 2017-08-09 VITALS — BP 150/70 | HR 76 | Temp 98.7°F | Ht 59.0 in | Wt 116.0 lb

## 2017-08-09 DIAGNOSIS — E119 Type 2 diabetes mellitus without complications: Secondary | ICD-10-CM | POA: Diagnosis present

## 2017-08-09 DIAGNOSIS — Z23 Encounter for immunization: Secondary | ICD-10-CM

## 2017-08-09 DIAGNOSIS — M25562 Pain in left knee: Secondary | ICD-10-CM | POA: Diagnosis not present

## 2017-08-09 LAB — POCT GLYCOSYLATED HEMOGLOBIN (HGB A1C): Hemoglobin A1C: 7.7

## 2017-08-09 NOTE — Patient Instructions (Signed)
Thank you for coming in to see [place] today. Please see below to review our plan for today's visit.  1. You have now received your flu shot. 2. We will continue the metformin for your diabetes. 3. Continue using icy hot for your left leg pain. If this becomes more problematic, return to the clinic.  Please call the clinic at [phone] if your symptoms worsen or you have any concerns. It was my pleasure to see you. -- [name], [place], PGY-2

## 2017-08-09 NOTE — Progress Notes (Signed)
   Subjective:   Patient ID: [name]    DOB: November 07, 1924, 83 y.o. female   MRN: [id number]  CC: "Left knee pain"  HPI: [name] is a 83 y.o. female who presents to clinic today for left knee pain. Problems discussed today are as follows:  Left knee pain: Onset 1 month ago. No history of trauma. Patient states "no pain, just hurts." Radiates from left knee to left hip. Patient has been using icy hot with complete relief of symptoms. She denies any other joints being affected. ROS: Denies fevers or chills, myalgias, nausea or vomiting, change in vision.  Diabetes: Patient has been taking metformin as prescribed without side effects. She states she is okay with continuing the medication if she needs to. She states she has stable during the rest of her medications and did not bring with her to this appointment. ROS: Denies chest pain, shortness of breath, abdominal pain, diarrhea.  Complete ROS performed, see HPI for pertinent.  [place]: HTN, PAD, h/o vertebrobasilar artery syndrome, HFpEF, hiatal hernia, GERD, tobacco use disorder, h/o b/l-breast cancer, h/o colon cancer, severe protein-calorie malnutrition, gout, IDA. Surgical history tot knee, EGD for esophogeal tear, b/l mastectomy, cardiac cath 2007 severe 3-vessel disease. Family history heart disease. Smoking status reviewed. Medications reviewed.  Objective:   BP (!) 150/70   Pulse 76   Temp 98.7 F (37.1 C) (Oral)   Ht 4\' 11"  (1.499 m)   Wt 116 lb (52.6 kg)   SpO2 98%   BMI 23.43 kg/m  Vitals and nursing note reviewed.  General: frail elder lady in wheelchair, well nourished, well developed, in no acute distress with non-toxic appearance HEENT: normocephalic, atraumatic, moist mucous membranes Neck: supple, non-tender without lymphadenopathy CV: regular rate and rhythm without murmurs, rubs, or gallops, no lower extremity edema Lungs: clear to auscultation bilaterally with normal work of breathing Abdomen: soft,  non-tender, non-distended, normoactive bowel sounds Skin: warm, dry, no rashes or lesions, cap refill < 2 seconds Extremities: warm and well perfused, normal tone, non-tender on palpation of left leg or with FABER test  Assessment & Plan:   Controlled type 2 diabetes mellitus without complication, without long-term current use of insulin (HCC) Chronic. Controlled. A1c 7.7. Currently on metformin. Benefits seem to outweigh the risks given significant improvement of glucose levels and low dose metformin use. --Will continue metformin 500 mg daily --RTC 3 months  Left knee pain Acute. Likely MSK related given resolution with icy hot. Does not appear to be limiting patient in any way. --Advised patient to continue using icy hot on left leg  Orders Placed This Encounter  Procedures  . Flu Vaccine QUAD 36+ mos IM  . POCT glycosylated hemoglobin (Hb A1C)   No orders of the defined types were placed in this encounter.   [name], [place], PGY-2 08/09/2017 3:29 PM

## 2017-08-09 NOTE — Assessment & Plan Note (Addendum)
Chronic. Controlled. A1c 7.7. Currently on metformin. Benefits seem to outweigh the risks given significant improvement of glucose levels and low dose metformin use. --Will continue metformin 500 mg daily --RTC 3 months

## 2017-08-09 NOTE — Assessment & Plan Note (Addendum)
Acute. Likely MSK related given resolution with icy hot. Does not appear to be limiting patient in any way. --Advised patient to continue using icy hot on left leg

## 2017-08-10 ENCOUNTER — Telehealth: Payer: Self-pay | Admitting: Family Medicine

## 2017-08-10 NOTE — Telephone Encounter (Signed)
Daughter is calling and would like the doctor to call in a glucometer and all the supplies needed to her pharmacy. jw

## 2017-08-13 ENCOUNTER — Ambulatory Visit (INDEPENDENT_AMBULATORY_CARE_PROVIDER_SITE_OTHER): Payer: Medicare Other | Admitting: Podiatry

## 2017-08-13 ENCOUNTER — Encounter: Payer: Self-pay | Admitting: Podiatry

## 2017-08-13 ENCOUNTER — Other Ambulatory Visit: Payer: Self-pay | Admitting: Family Medicine

## 2017-08-13 VITALS — BP 167/83 | HR 78

## 2017-08-13 DIAGNOSIS — E1142 Type 2 diabetes mellitus with diabetic polyneuropathy: Secondary | ICD-10-CM

## 2017-08-13 DIAGNOSIS — E119 Type 2 diabetes mellitus without complications: Secondary | ICD-10-CM

## 2017-08-13 DIAGNOSIS — I739 Peripheral vascular disease, unspecified: Secondary | ICD-10-CM

## 2017-08-13 DIAGNOSIS — B351 Tinea unguium: Secondary | ICD-10-CM | POA: Diagnosis not present

## 2017-08-13 MED ORDER — ONETOUCH VERIO IQ SYSTEM W/DEVICE KIT: PACK | 0 refills | Status: DC

## 2017-08-13 MED ORDER — ONETOUCH DELICA LANCETS FINE MISC: 0 refills | Status: DC

## 2017-08-13 MED ORDER — GLUCOSE BLOOD VI STRP: ORAL_STRIP | 0 refills | Status: DC

## 2017-08-13 MED ORDER — ONETOUCH DELICA LANCING DEV MISC: 1.0000 | Freq: Every day | 0 refills | Status: DC

## 2017-08-13 NOTE — Progress Notes (Signed)
Patient ID: [name], female   DOB: [birth year], 83 y.o.   MRN: [id number]   Subjective: Patient presents for follow-up care for ongoing treatment of bilateral posterior heel ulcers since the visit of 04/25/2017. Patient is currently applying Silvadene cream to the ulcers after diluted soft soap rinses daily. She is elevating the legs and pillows and was instructed to purchase heel protectors, however, patient again is not purchase heel protectors   Objective: Peripheral pitting edema bilaterally: DP and PT pulses trace palpable bilaterally Capillary reflex delayed bilaterally Sensation to 10 g monofilament wire intact 5/5 bilaterally Vibratory sensation nonreactive bilaterally Ankle reflex reactive bilaterally Atrophic skin bilaterally with absent hair growth bilaterally Posterior right and left heels have callus that remains closed after debridement There is no malodor or erythema or warmth surrounding callus  The toenails elongated, brittle, discolored and tender to palpation 6-10 Dorsi flexion right 5/5 right and 4/5 left Plantar flexion 5/5 bilaterally Patient walks slowly with walker  Assessment: Pre-ulcerative callus posterior heels bilaterally Mycotic toenails with symptoms 6-10 Diabetic with peripheral arterial disease and peripheral neuropathy  Plan: Debride callus posterior heels without a bleeding Instructed patient apply Vaseline and Band-Aids to the callused areas on the right and left heels Again advised patient to purchase heel protectors Debrided toenails 6-10 mechanical and electrically without any bleeding  Reappoint 3 months or sooner if patient has concern

## 2017-08-13 NOTE — Patient Instructions (Signed)
Apply Vaseline and Band-Aids to the back of the right and left heels daily until the skin looks normal Purchase heel protectors and wear them when in a seated or sleeping position  Diabetes and Foot Care Diabetes may cause you to have problems because of poor blood supply (circulation) to your feet and legs. This may cause the skin on your feet to become thinner, break easier, and heal more slowly. Your skin may become dry, and the skin may peel and crack. You may also have nerve damage in your legs and feet causing decreased feeling in them. You may not notice minor injuries to your feet that could lead to infections or more serious problems. Taking care of your feet is one of the most important things you can do for yourself. Follow these instructions at home:  Wear shoes at all times, even in the house. Do not go barefoot. Bare feet are easily injured.  Check your feet daily for blisters, cuts, and redness. If you cannot see the bottom of your feet, use a mirror or ask someone for help.  Wash your feet with warm water (do not use hot water) and mild soap. Then pat your feet and the areas between your toes until they are completely dry. Do not soak your feet as this can dry your skin.  Apply a moisturizing lotion or petroleum jelly (that does not contain alcohol and is unscented) to the skin on your feet and to dry, brittle toenails. Do not apply lotion between your toes.  Trim your toenails straight across. Do not dig under them or around the cuticle. File the edges of your nails with an emery board or nail file.  Do not cut corns or calluses or try to remove them with medicine.  Wear clean socks or stockings every day. Make sure they are not too tight. Do not wear knee-high stockings since they may decrease blood flow to your legs.  Wear shoes that fit properly and have enough cushioning. To break in new shoes, wear them for just a few hours a day. This prevents you from injuring your  feet. Always look in your shoes before you put them on to be sure there are no objects inside.  Do not cross your legs. This may decrease the blood flow to your feet.  If you find a minor scrape, cut, or break in the skin on your feet, keep it and the skin around it clean and dry. These areas may be cleansed with mild soap and water. Do not cleanse the area with peroxide, alcohol, or iodine.  When you remove an adhesive bandage, be sure not to damage the skin around it.  If you have a wound, look at it several times a day to make sure it is healing.  Do not use heating pads or hot water bottles. They may burn your skin. If you have lost feeling in your feet or legs, you may not know it is happening until it is too late.  Make sure your health care provider performs a complete foot exam at least annually or more often if you have foot problems. Report any cuts, sores, or bruises to your health care provider immediately. Contact a health care provider if:  You have an injury that is not healing.  You have cuts or breaks in the skin.  You have an ingrown nail.  You notice redness on your legs or feet.  You feel burning or tingling in your legs or feet.  You have pain or cramps in your legs and feet.  Your legs or feet are numb.  Your feet always feel cold. Get help right away if:  There is increasing redness, swelling, or pain in or around a wound.  There is a red line that goes up your leg.  Pus is coming from a wound.  You develop a fever or as directed by your health care provider.  You notice a bad smell coming from an ulcer or wound. This information is not intended to replace advice given to you by your health care provider. Make sure you discuss any questions you have with your health care provider. Document Released: 10/27/2000 Document Revised: 04/06/2016 Document Reviewed: 04/08/2013 Elsevier Interactive Patient Education  2017 [employer].

## 2017-08-15 ENCOUNTER — Other Ambulatory Visit: Payer: Self-pay | Admitting: *Deleted

## 2017-08-15 DIAGNOSIS — M546 Pain in thoracic spine: Secondary | ICD-10-CM

## 2017-08-15 MED ORDER — TRAMADOL HCL 50 MG PO TABS: 50.0000 mg | ORAL_TABLET | Freq: Three times a day (TID) | ORAL | 0 refills | Status: DC | PRN

## 2017-08-15 MED ORDER — RANITIDINE HCL 150 MG PO TABS: 150.0000 mg | ORAL_TABLET | Freq: Two times a day (BID) | ORAL | 3 refills | Status: DC

## 2017-08-18 ENCOUNTER — Other Ambulatory Visit: Payer: Self-pay | Admitting: Internal Medicine

## 2017-08-23 ENCOUNTER — Telehealth: Payer: Self-pay | Admitting: *Deleted

## 2017-08-23 NOTE — Telephone Encounter (Signed)
Sharyn Lull with [facility] called stating there was an ICD-10 code form faxed last week. She has not received the form back. She will fax another form today and asking if form can be completed ASAP to continue patient's care. Please complete and fax back.  Derl Barrow, RN

## 2017-08-31 NOTE — Telephone Encounter (Signed)
Daughter came to clinic today stating [name] has not received forms and patient in danger of losing services. Form not in MD box, paged MD x 2 with no response. Daughter would like a call back asap with status of form [phone], [name])

## 2017-09-05 ENCOUNTER — Telehealth: Payer: Self-pay

## 2017-09-05 NOTE — Telephone Encounter (Signed)
[name] from [facility] called again and said that she is still waiting on ICD-10 code documentation for home health care for patient.  She has faxed it here to our office for the second time and is awaiting it because patient is in danger of losing her benefits.  She needs it to be faxed ASAP to [phone].

## 2017-09-10 DIAGNOSIS — R079 Chest pain, unspecified: Secondary | ICD-10-CM | POA: Diagnosis not present

## 2017-09-13 NOTE — Telephone Encounter (Signed)
Patient daughter, Katharine Look, requesting MD call her directly at [phone] regarding paperwork.

## 2017-09-19 ENCOUNTER — Telehealth: Payer: Self-pay | Admitting: Family Medicine

## 2017-09-19 NOTE — Telephone Encounter (Signed)
Personal care services form dropped off for at front desk for completion.  Verified that patient section of form has been completed.  Last DOS/WCC with PCP was 08/09/17 .  Placed form in team folder to be completed by clinical staff.  Crista Luria

## 2017-09-21 NOTE — Telephone Encounter (Signed)
Placed in MDs box. Shakaya Bhullar, CMA  

## 2017-09-24 NOTE — Telephone Encounter (Signed)
Reviewed, completed, and signed form.  Note routed to RN team inbasket and placed completed form in Clinic RN's office (wall pocket above desk).  Tillman Bing, DO

## 2017-09-24 NOTE — Telephone Encounter (Addendum)
Tharon Aquas, RN with [facility] notified that form has been faxed to [phone] and original is ready for pick up in front office. Hubbard Hartshorn, RN, BSN

## 2017-09-28 ENCOUNTER — Other Ambulatory Visit: Payer: Self-pay

## 2017-09-28 ENCOUNTER — Ambulatory Visit (INDEPENDENT_AMBULATORY_CARE_PROVIDER_SITE_OTHER): Payer: Medicare Other | Admitting: Family Medicine

## 2017-09-28 VITALS — BP 142/68 | HR 90 | Temp 97.9°F | Ht 59.0 in | Wt 116.0 lb

## 2017-09-28 DIAGNOSIS — H04123 Dry eye syndrome of bilateral lacrimal glands: Secondary | ICD-10-CM

## 2017-09-28 MED ORDER — HYPROMELLOSE (GONIOSCOPIC) 2.5 % OP SOLN: 1.0000 [drp] | Freq: Three times a day (TID) | OPHTHALMIC | 12 refills | Status: DC

## 2017-09-28 NOTE — Assessment & Plan Note (Addendum)
Acute.  Unlikely corneal abrasion given bilateral exposure.  Unlikely allergic given lack of her eyes.  No signs of secondary bacterial infection.  Vision is poor with 20/400 right and 20/200 on the left.  Structurally her eyes are well-appearing.  Suspect this is more likely related to decreased tear production. - Given prescription for artificial tears with instructions to take TID both eyes - RTC in 6 months or sooner if symptoms do not improve - Instructed patient to contact [facility] if problems develop

## 2017-09-28 NOTE — Progress Notes (Signed)
   Subjective   Patient ID: [name]    DOB: 02-11-1924, 83 y.o. female   MRN: [id number]  CC: "Dry eyes"  HPI: [name] is a 83 y.o. female who presents to clinic today for the following:  Dry eyes: Onset 2 weeks ago with grain-like sensation in eyes bilaterally.  Also associated clear tears.  Denies fevers or chills, purulent discharge, pruritus, loss of vision, photophobia.  Patient denies history of trauma.  She denies previous history of similar symptoms.  She tried an over-the-counter medication but cannot recall the name.  She was seen by [facility] about 1 year ago but was told she was "too old to have anything done."  Patient does have a history of cataracts bilaterally.  ROS: see HPI for pertinent.  [place]: HTN, PAD, h/o vertebrobasilar artery syndrome, HFpEF, hiatal hernia, GERD, tobacco use disorder, h/o b/l-breast cancer, h/o colon cancer, severe protein-calorie malnutrition, gout, IDA. Surgical history tot knee, EGD for esophogeal tear, b/l mastectomy, cardiac cath 2007 severe 3-vessel disease. Family history heart disease. Smoking status reviewed. Medications reviewed.  Objective   BP (!) 142/68   Pulse 90   Temp 97.9 F (36.6 C) (Oral)   Ht 4\' 11"  (1.499 m)   Wt 116 lb (52.6 kg)   SpO2 99%   BMI 23.43 kg/m  Vitals and nursing note reviewed.  General: well nourished, well developed, NAD with non-toxic appearance HEENT: normocephalic, atraumatic, moist mucous membranes, pink conjunctiva bilaterally, PERRLA, EOMI Neck: supple, non-tender without lymphadenopathy Cardiovascular: regular rate and rhythm without murmurs, rubs, or gallops Lungs: clear to auscultation bilaterally with normal work of breathing Skin: warm, dry, no rashes or lesions, cap refill < 2 seconds Extremities: warm and well perfused, normal tone, no edema  Assessment & Plan   Dry eyes Acute.  Unlikely corneal abrasion given bilateral exposure.  Unlikely allergic given lack of  her eyes.  No signs of secondary bacterial infection.  Vision is poor with 20/400 right and 20/200 on the left.  Structurally her eyes are well-appearing.  Suspect this is more likely related to decreased tear production. - Given prescription for artificial tears with instructions to take TID both eyes - RTC in 6 months or sooner if symptoms do not improve - Instructed patient to contact [facility] if problems develop  No orders of the defined types were placed in this encounter.  Meds ordered this encounter  Medications  . hydroxypropyl methylcellulose / hypromellose (ISOPTO TEARS / GONIOVISC) 2.5 % ophthalmic solution    Sig: Place 1 drop 3 (three) times daily into both eyes.    Dispense:  15 mL    Refill:  [place], [place], PGY-2 10/01/2017, 1:57 PM

## 2017-09-28 NOTE — Patient Instructions (Signed)
Thank you for coming in to see [place] today. Please see below to review our plan for today's visit.  1.  Sent in a prescription for artificial tears.  Please place 1 drop in both eyes 3 times daily. 2.  Return to the clinic in 1 month if symptoms do not improve with these eyedrops. 3.  You are overdue for your annual wellness visit.  This is a 1 hour free visit where we can review her medical history and make sure you are up-to-date on everything.  We can help facilitate scheduling this when you leave today is you are interested.  Please call the clinic at [phone] if your symptoms worsen or you have any concerns. It was our pleasure to serve you.  [name], [place], PGY-2

## 2017-10-14 ENCOUNTER — Other Ambulatory Visit: Payer: Self-pay | Admitting: Family Medicine

## 2017-10-25 ENCOUNTER — Other Ambulatory Visit: Payer: Self-pay | Admitting: Family Medicine

## 2017-11-12 ENCOUNTER — Ambulatory Visit: Payer: Medicare Other | Admitting: Podiatry

## 2017-11-19 ENCOUNTER — Emergency Department (HOSPITAL_COMMUNITY)
Admission: EM | Admit: 2017-11-19 | Discharge: 2017-11-19 | Disposition: A | Discharge status: Home / Self Care | Payer: Medicare Other | Attending: Emergency Medicine | Admitting: Emergency Medicine

## 2017-11-19 ENCOUNTER — Ambulatory Visit: Payer: Medicare Other | Admitting: Podiatry

## 2017-11-19 ENCOUNTER — Emergency Department (HOSPITAL_COMMUNITY): Payer: Medicare Other

## 2017-11-19 DIAGNOSIS — Z79899 Other long term (current) drug therapy: Secondary | ICD-10-CM | POA: Diagnosis not present

## 2017-11-19 DIAGNOSIS — Z7982 Long term (current) use of aspirin: Secondary | ICD-10-CM | POA: Diagnosis not present

## 2017-11-19 DIAGNOSIS — I6789 Other cerebrovascular disease: Secondary | ICD-10-CM | POA: Diagnosis not present

## 2017-11-19 DIAGNOSIS — F0390 Unspecified dementia without behavioral disturbance: Secondary | ICD-10-CM | POA: Diagnosis not present

## 2017-11-19 DIAGNOSIS — I2510 Atherosclerotic heart disease of native coronary artery without angina pectoris: Secondary | ICD-10-CM | POA: Diagnosis not present

## 2017-11-19 DIAGNOSIS — J45909 Unspecified asthma, uncomplicated: Secondary | ICD-10-CM | POA: Insufficient documentation

## 2017-11-19 DIAGNOSIS — Z853 Personal history of malignant neoplasm of breast: Secondary | ICD-10-CM | POA: Diagnosis not present

## 2017-11-19 DIAGNOSIS — I252 Old myocardial infarction: Secondary | ICD-10-CM | POA: Insufficient documentation

## 2017-11-19 DIAGNOSIS — E785 Hyperlipidemia, unspecified: Secondary | ICD-10-CM | POA: Insufficient documentation

## 2017-11-19 DIAGNOSIS — M25531 Pain in right wrist: Secondary | ICD-10-CM | POA: Diagnosis not present

## 2017-11-19 DIAGNOSIS — Z7902 Long term (current) use of antithrombotics/antiplatelets: Secondary | ICD-10-CM | POA: Diagnosis not present

## 2017-11-19 DIAGNOSIS — I5022 Chronic systolic (congestive) heart failure: Secondary | ICD-10-CM | POA: Diagnosis not present

## 2017-11-19 DIAGNOSIS — Z7984 Long term (current) use of oral hypoglycemic drugs: Secondary | ICD-10-CM | POA: Diagnosis not present

## 2017-11-19 DIAGNOSIS — R200 Anesthesia of skin: Secondary | ICD-10-CM | POA: Diagnosis not present

## 2017-11-19 DIAGNOSIS — E119 Type 2 diabetes mellitus without complications: Secondary | ICD-10-CM | POA: Diagnosis not present

## 2017-11-19 DIAGNOSIS — R4781 Slurred speech: Secondary | ICD-10-CM | POA: Diagnosis not present

## 2017-11-19 DIAGNOSIS — Z86718 Personal history of other venous thrombosis and embolism: Secondary | ICD-10-CM | POA: Diagnosis not present

## 2017-11-19 DIAGNOSIS — I110 Hypertensive heart disease with heart failure: Secondary | ICD-10-CM | POA: Insufficient documentation

## 2017-11-19 LAB — CBC
HCT: 31.7 % — ABNORMAL LOW (ref 36.0–46.0)
Hemoglobin: 9.8 g/dL — ABNORMAL LOW (ref 12.0–15.0)
MCH: 22.3 pg — ABNORMAL LOW (ref 26.0–34.0)
MCHC: 30.9 g/dL (ref 30.0–36.0)
MCV: 72 fL — ABNORMAL LOW (ref 78.0–100.0)
Platelets: 186 10*3/uL (ref 150–400)
RBC: 4.4 MIL/uL (ref 3.87–5.11)
RDW: 16.4 % — AB (ref 11.5–15.5)
WBC: 6.3 10*3/uL (ref 4.0–10.5)

## 2017-11-19 LAB — I-STAT CHEM 8, ED
BUN: 16 mg/dL (ref 6–20)
Calcium, Ion: 1.36 mmol/L (ref 1.15–1.40)
Chloride: 104 mmol/L (ref 101–111)
Creatinine, Ser: 1 mg/dL (ref 0.44–1.00)
Glucose, Bld: 169 mg/dL — ABNORMAL HIGH (ref 65–99)
HEMATOCRIT: 34 % — AB (ref 36.0–46.0)
Hemoglobin: 11.6 g/dL — ABNORMAL LOW (ref 12.0–15.0)
POTASSIUM: 3.4 mmol/L — AB (ref 3.5–5.1)
SODIUM: 139 mmol/L (ref 135–145)
TCO2: 23 mmol/L (ref 22–32)

## 2017-11-19 LAB — I-STAT TROPONIN, ED: Troponin i, poc: 0.02 ng/mL (ref 0.00–0.08)

## 2017-11-19 LAB — DIFFERENTIAL
BASOS ABS: 0.1 10*3/uL (ref 0.0–0.1)
Basophils Relative: 1 %
EOS ABS: 0.1 10*3/uL (ref 0.0–0.7)
Eosinophils Relative: 1 %
LYMPHS ABS: 2 10*3/uL (ref 0.7–4.0)
Lymphocytes Relative: 31 %
MONO ABS: 0.5 10*3/uL (ref 0.1–1.0)
Monocytes Relative: 8 %
NEUTROS PCT: 59 %
Neutro Abs: 3.6 10*3/uL (ref 1.7–7.7)

## 2017-11-19 LAB — COMPREHENSIVE METABOLIC PANEL
ALT: 7 U/L — ABNORMAL LOW (ref 14–54)
ANION GAP: 9 (ref 5–15)
AST: 18 U/L (ref 15–41)
Albumin: 3.1 g/dL — ABNORMAL LOW (ref 3.5–5.0)
Alkaline Phosphatase: 49 U/L (ref 38–126)
BUN: 16 mg/dL (ref 6–20)
CHLORIDE: 104 mmol/L (ref 101–111)
CO2: 24 mmol/L (ref 22–32)
Calcium: 10.8 mg/dL — ABNORMAL HIGH (ref 8.9–10.3)
Creatinine, Ser: 1.15 mg/dL — ABNORMAL HIGH (ref 0.44–1.00)
GFR calc Af Amer: 47 mL/min — ABNORMAL LOW (ref >60)
GFR calc non Af Amer: 40 mL/min — ABNORMAL LOW (ref >60)
GLUCOSE: 165 mg/dL — AB (ref 65–99)
POTASSIUM: 3.4 mmol/L — AB (ref 3.5–5.1)
SODIUM: 137 mmol/L (ref 135–145)
TOTAL PROTEIN: 7.2 g/dL (ref 6.5–8.1)
Total Bilirubin: 0.5 mg/dL (ref 0.3–1.2)

## 2017-11-19 LAB — PROTIME-INR
INR: 1.15
Prothrombin Time: 14.6 seconds (ref 11.4–15.2)

## 2017-11-19 LAB — APTT: APTT: 30 s (ref 24–36)

## 2017-11-19 MED ORDER — IBUPROFEN 400 MG PO TABS
400.0000 mg | ORAL_TABLET | Freq: Once | ORAL | Status: AC
  Administered 2017-11-19: 400 mg via ORAL
  Filled 2017-11-19: qty 1

## 2017-11-19 NOTE — ED Notes (Signed)
EMERGENCY DEPARTMENT  US GUIDANCE EXAM Emergency Ultrasound:  US Guidance for Needle Guidance  INDICATIONS: Difficult vascular access with blood draw Linear probe used in real-time to visualize location of needle entry through skin.   PERFORMED BY: Myself IMAGES ARCHIVED?: no LIMITATIONS: none VIEWS USED: Transverse INTERPRETATION: Needle visualized within vein   Tolerated well w/ no complications.   Aaron Edelman RN  7:20 PM 11/19/17

## 2017-11-19 NOTE — ED Notes (Signed)
Patient transported to CT 

## 2017-11-19 NOTE — ED Triage Notes (Signed)
Per EMS pt from home, lives with daughter. Pt endorses right arm and leg numbness and pain, slurred speech and mental fog. LSN 11/18/2017 2200. Pt woke up at 8:30 this morning with all symptoms which did not resolve. Pt alert, oriented to self, situation and location. Disoriented to time. Pt able to follow commands, mild slurred speech, right arm and leg weakness and pain. No facial droop noted. Pt refused IV

## 2017-11-19 NOTE — ED Provider Notes (Signed)
[facility] EMERGENCY DEPARTMENT Provider Note   CSN: [id number] Arrival date & time: 11/19/17  1847     History   Chief Complaint Chief Complaint  Patient presents with  . Numbness    HPI [name] is a 84 y.o. female.  The history is provided by the patient, medical records and a relative.  Wrist Pain  This is a new problem. Episode onset: this morning. The problem occurs constantly. The problem has been gradually improving. Pertinent negatives include no chest pain, no abdominal pain, no headaches and no shortness of breath. Exacerbated by: palpation, movement. Relieved by: nothing tried. She has tried nothing for the symptoms.    Past Medical History:  Diagnosis Date  . Allergy   . Anemia    Iron def anemia  . Arthritis   . Asthma   . Breast cancer ([place]) 10/2010   Invasive Ductal Carcinoma, s/p bilateral mastectomy, now on Tamoxifen. Followed by Dr Jamse Arn.   . Breast cancer ([place]) 2011   s/p Bilateral masectomy  . CAD (coronary artery disease)   . Carotid artery aneurysm ([place]) 08/2010   right ICA, 5 x 6mm  . CHF (congestive heart failure) (HCC)    Systolic   . Colon cancer ([place])   . Colon cancer ([place]) 12/11/2013  . DDD (degenerative disc disease), cervical   . Diabetes mellitus   . Diverticulosis   . GERD (gastroesophageal reflux disease)   . H/O: GI bleed   . Hiatal hernia   . History of colon cancer   . Hypercalcemia 06/09/2014  . Hyperlipidemia   . Hypertension   . Myocardial infarction ([place])   . Neuropathy   . Pneumonia 03/2012  . Shortness of breath     Patient Active Problem List   Diagnosis Date Noted  . Left knee pain 08/09/2017  . Protein-calorie malnutrition, severe 02/06/2017  . Hypokalemia 10/13/2016  . Pressure injury of skin 08/31/2016  . Pulmonary embolus ([place]) 08/30/2016  . Mild dementia 06/01/2016  . Depression 01/20/2016  . Suicidal ideation 01/19/2016  . Prolapse of female pelvic organs 12/28/2015  . Leg  weakness, bilateral 12/08/2015  . Weakness of right lower extremity 11/25/2015  . Dry eyes 10/28/2015  . (HFpEF) heart failure with preserved ejection fraction ([place])   . CAD in native artery   . Gout 05/14/2015  . Dyslipidemia   . Essential hypertension   . Seasonal allergies 02/10/2015  . Stroke-like symptoms   . Stroke-like episode ([place]) 01/25/2015  . Syncope and collapse 01/25/2015  . Dizziness 09/19/2014  . Anemia of chronic illness 08/19/2014  . Acute superficial venous thrombosis of right lower extremity 08/19/2014  . Hypercalcemia 06/09/2014  . Breast cancer, left breast ([place]) 06/09/2014  . Breast cancer, right breast ([place]) 06/09/2014  . Edema of left lower extremity 03/30/2014  . PAD (peripheral artery disease) ([place]) 03/30/2014  . History of colon cancer 12/11/2013  . History of fall 05/15/2013  . Bilateral leg pain 03/07/2013  . Lower extremity edema 02/19/2013  . Stricture and stenosis of esophagus 10/27/2012  . Constipation 06/24/2012  . Thoracic back pain 05/24/2012  . Tobacco abuse 06/02/2011  . History of breast cancer in female, bilateral.  Mastectomies 10/24/2010. 04/26/2011  . Hiatal hernia 03/22/2011  . Colon polyp 03/22/2011  . Poor circulation 03/22/2011  . Cough 01/31/2011  . 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 Neuropathy ([place]) 08/25/2009  . ANEMIA-IRON DEFICIENCY 04/29/2007  . HLD (hyperlipidemia) 01/29/2007  . Controlled type 2 diabetes mellitus  without complication, without long-term current use of insulin ([place]) 01/10/2007  . HYPERTENSION, BENIGN SYSTEMIC 01/10/2007  . Coronary atherosclerosis 01/10/2007  . Chronic systolic heart failure ([place]) 01/10/2007  . Asthma 01/10/2007  . Reflux esophagitis 01/10/2007  . Osteoarthritis, multiple sites 01/10/2007    Past Surgical History:  Procedure Laterality Date  . Breast masectomy  10/2010   Bilateral masectomy by Dr Lucia Gaskins s/p invasive ductal carcinoma.  Marland Kitchen BREAST SURGERY    . Cardiac   Catherization  2007   Severe 3-vessel disease.  EF 20-25%.  . ESOPHAGOGASTRODUODENOSCOPY  2001   Esophageal Tear  . ESOPHAGOGASTRODUODENOSCOPY  10/27/2012   Procedure: ESOPHAGOGASTRODUODENOSCOPY (EGD);  Surgeon: Irene Shipper, MD;  Location[facility] ENDOSCOPY;  Service: Endoscopy;  Laterality: N/A;  pat  . JOINT REPLACEMENT Right 2001   Knee  . TOTAL KNEE ARTHROPLASTY  2001    OB History    No data available       Home Medications    Prior to Admission medications   Medication Sig Start Date End Date Taking? Authorizing Provider  acetaminophen (TYLENOL) 500 MG tablet Take 500 mg by mouth every 6 (six) hours as needed for moderate pain.    Yes [provider]  albuterol (PROVENTIL HFA;VENTOLIN HFA) 108 (90 BASE) MCG/ACT inhaler Inhale 2 puffs into the lungs every 6 (six) hours as needed. For shortness of breath Patient taking differently: Inhale 2 puffs into the lungs every 6 (six) hours as needed for shortness of breath.  01/18/15  Yes [name], MD  Amino Acids-Protein Hydrolys (FEEDING SUPPLEMENT, PRO-STAT SUGAR FREE 64,) LIQD Take 30 mLs by mouth 2 (two) times daily with a meal. ensure   Yes [provider]  amLODipine (NORVASC) 5 MG tablet Take 1 tablet (5 mg total) by mouth daily. 06/25/17  Yes [place] Bing, DO  aspirin (ASPIRIN CHILDRENS) 81 MG chewable tablet Chew 81 mg by mouth daily.    Yes [provider]  carvedilol (COREG) 12.5 MG tablet TAKE 1 TABLET BY MOUTH TWICE A DAY WITH A MEAL Patient taking differently: TAKE 12.5  TABLET BY MOUTH TWICE A DAY WITH A MEAL 10/25/17  Yes [place] Bing, DO  guaifenesin (ROBITUSSIN) 100 MG/5ML syrup Take 10 mLs (200 mg total) by mouth 3 (three) times daily as needed for cough. 08/15/16  Yes [name], MD  loratadine (CLARITIN) 10 MG tablet TAKE 1/2 TABLET BY MOUTH DAILY Patient taking differently: TAKE 5 mg  TABLET BY MOUTH as needed 12/08/15  Yes [name], MD  losartan (COZAAR) 50 MG  tablet TAKE 1 TABLET (50 MG TOTAL) BY MOUTH AT BEDTIME. 06/25/17  Yes [place] Bing, DO  metFORMIN (GLUCOPHAGE) 500 MG tablet Take 1 tablet (500 mg total) by mouth daily with breakfast. 07/31/17  Yes McMullen, David J, DO  PATADAY 0.2 % SOLN APPLY 1 DROP TO EYE DAILY. Patient taking differently: Instill 1 drop into each eye once a day 11/23/15  Yes [name], MD  ranitidine (ZANTAC) 150 MG tablet TAKE 1 TABLET BY MOUTH TWICE A DAY Patient taking differently: TAKE 150 mg TABLET BY daily 10/15/17  Yes [place] Bing, DO  rosuvastatin (CRESTOR) 20 MG tablet TAKE 1 TABLET (20 MG TOTAL) BY MOUTH AT BEDTIME. 08/20/17  Yes [place] Bing, DO  senna-docusate (SENOKOT-S) 8.6-50 MG tablet Take 2 tablets by mouth at bedtime. Patient taking differently: Take 1 tablet by mouth every other day.  04/23/17  Yes Bacigalupo, Dionne Bucy, MD  silver  sulfADIAZINE (SILVADENE) 1 % cream Apply 1 application topically daily. Patient taking differently: Apply 1 application topically daily as needed.  05/15/17  Yes Tuchman, Richard C, DPM  traMADol (ULTRAM) 50 MG tablet Take 1 tablet (50 mg total) by mouth every 8 (eight) hours as needed for severe pain. 08/15/17  Yes [place] Bing, DO  capsaicin (ZOSTRIX) 0.025 % cream APPLY TO AFFECTED AREA TWICE A DAY Patient not taking: Reported on 11/19/2017 04/11/16   [name], MD  clopidogrel (PLAVIX) 75 MG tablet TAKE 1 TABLET BY MOUTH EVERY DAY Patient not taking: Reported on 09/28/2017 04/16/17   [name], MD  ferrous sulfate 325 (65 FE) MG tablet Take 1 tablet (325 mg total) by mouth daily with breakfast. Patient not taking: Reported on 09/28/2017 11/21/16   [name], MD  hydroxypropyl methylcellulose / hypromellose (ISOPTO TEARS / GONIOVISC) 2.5 % ophthalmic solution Place 1 drop 3 (three) times daily into both eyes. Patient not taking: Reported on 11/19/2017 09/28/17   [place] Bing, DO  petrolatum-hydrophilic-aloe vera (ALOE VESTA) ointment  Apply topically 2 (two) times daily as needed for wound care. Patient not taking: Reported on 11/19/2017 05/10/17   Arturo Morton    Family History Family History  Problem Relation Age of Onset  . Sickle cell anemia Other   . Heart disease Mother   . Heart disease Father   . Cancer Daughter 55       breast ca  . Hypertension Daughter   . Cancer Daughter 62       breast ca  . Hypertension Daughter   . Heart disease Son        Poor circulation-Left Leg  . Diabetes Daughter   . Hypertension Daughter   . Hyperlipidemia Daughter        Poor circulation- Toe amputation    Social History Social History   Tobacco Use  . Smoking status: Never Smoker  . Smokeless tobacco: Current User    Types: Snuff  Substance Use Topics  . Alcohol use: No  . Drug use: No     Allergies   Tape and Lisinopril   Review of Systems Review of Systems  Constitutional: Negative for chills and fever.  HENT: Negative for rhinorrhea and sore throat.   Eyes: Negative for visual disturbance.  Respiratory: Negative for cough and shortness of breath.   Cardiovascular: Negative for chest pain.  Gastrointestinal: Negative for abdominal pain, nausea and vomiting.  Genitourinary: Negative for dysuria.  Musculoskeletal: Positive for arthralgias. Negative for back pain.  Skin: Negative for color change and rash.  Allergic/Immunologic: Negative for immunocompromised state.  Neurological: Positive for numbness ("pins & needles"). Negative for seizures, syncope and headaches.  All other systems reviewed and are negative.    Physical Exam Updated Vital Signs BP 135/86   Pulse 63   Temp 98.6 F (37 C) (Oral)   Resp 14   Ht 4\' 11"  (1.499 m)   Wt 52.2 kg (115 lb)   SpO2 97%   BMI 23.23 kg/m   Physical Exam  Constitutional: She is oriented to person, place, and time. She appears well-developed and well-nourished. No distress.  HENT:  Head: Normocephalic and atraumatic.  Eyes: Conjunctivae are  normal.  Neck: Neck supple.  Cardiovascular: Normal rate and regular rhythm.  No murmur heard. Pulmonary/Chest: Effort normal and breath sounds normal. No respiratory distress.  Abdominal: Soft. There is no tenderness.  Musculoskeletal: She exhibits no edema or deformity.  Mild TTP  over distal right forearm proximal to the carpal bones, no palpable deformity or crepitus, no overlying warmth, swelling, or erythema to suggest infection; FROM at the right wrist & fingers  Neurological: She is alert and oriented to person, place, and time.  Skin: Skin is warm and dry.  Psychiatric: She has a normal mood and affect.  Nursing note and vitals reviewed.    ED Treatments / Results  Labs (all labs ordered are listed, but only abnormal results are displayed) Labs Reviewed  CBC - Abnormal; Notable for the following components:      Result Value   Hemoglobin 9.8 (*)    HCT 31.7 (*)    MCV 72.0 (*)    MCH 22.3 (*)    RDW 16.4 (*)    All other components within normal limits  COMPREHENSIVE METABOLIC PANEL - Abnormal; Notable for the following components:   Potassium 3.4 (*)    Glucose, Bld 165 (*)    Creatinine, Ser 1.15 (*)    Calcium 10.8 (*)    Albumin 3.1 (*)    ALT 7 (*)    GFR calc non Af Amer 40 (*)    GFR calc Af Amer 47 (*)    All other components within normal limits  I-STAT CHEM 8, ED - Abnormal; Notable for the following components:   Potassium 3.4 (*)    Glucose, Bld 169 (*)    Hemoglobin 11.6 (*)    HCT 34.0 (*)    All other components within normal limits  PROTIME-INR  APTT  DIFFERENTIAL  I-STAT TROPONIN, ED  CBG MONITORING, ED    EKG  EKG Interpretation None       Radiology Ct Head Wo Contrast  Result Date: 11/19/2017 CLINICAL DATA:  Right arm and leg numbness and pain. Slurred speech. EXAM: CT HEAD WITHOUT CONTRAST TECHNIQUE: Contiguous axial images were obtained from the base of the skull through the vertex without intravenous contrast. COMPARISON:   02/03/2017 FINDINGS: Brain: No evidence of intracranial hemorrhage, hydrocephalus, extra-axial collection or mass lesion/mass effect. Area of encephalomalacia in the left cerebellar cortex may represent an age-indeterminate infarct. Probable lacunar infarcts in bilateral basal ganglia and right thalamus. Deep white matter microangiopathy, including pons and the cerebellar peduncles. Vascular: Vascular calcifications at the skullbase. Skull: Normal. Negative for fracture or focal lesion. Sinuses/Orbits: No acute finding. Other: None. IMPRESSION: No evidence of intracranial hemorrhage. Atrophy, chronic white matter microvascular disease. Area of encephalomalacia in the left cerebellar cortex may represent an age-indeterminate ischemic infarct. Probable lacunar infarcts in bilateral basal ganglia and right thalamus. Electronically Signed   By: Fidela Salisbury M.D.   On: 11/19/2017 19:59    Procedures Procedures (including critical care time)  Medications Ordered in ED Medications  ibuprofen (ADVIL,MOTRIN) tablet 400 mg (400 mg Oral Given 11/19/17 2025)     Initial Impression / Assessment and Plan / ED Course  I have reviewed the triage vital signs and the nursing notes.  Pertinent labs & imaging results that were available during my care of the patient were reviewed by me and considered in my medical decision making (see chart for details).     Pt with h/o gout presents with forearm pain. Says she woke up with the pain & some "pins & needles" this morning, but cannot recall injuring it in any way and says the area wasn't painful at all yesterday; she thinks she might've slept on it wrong. Pt says she went to the foot doctor today, and then mentioned it  to one of her family members who suggested she come here for evaluation. Denies F/C, HA, lightheadedness, CP, SOB, N/V/D, urinary symptoms.  VS & exam as above. CT head ordered from triage w/o acute intracranial abnormalities.  Labs unremarkable.  Motrin given for pain; no indication for imaging at this time.  On re-evaluation, Pt's pain has resolved.  Explained all results to the Pt. Will discharge the Pt home. Recommending follow-up with PCP. ED return precautions provided. Pt acknowledged understanding of, and concurrence with the plan. All questions answered to her satisfaction. In stable condition at the time of discharge.  Final Clinical Impressions(s) / ED Diagnoses   Final diagnoses:  Arthralgia of right wrist    ED Discharge Orders    None       Jenny Reichmann, MD 11/20/17 3536    Carmin Muskrat, MD 11/20/17 1719

## 2017-11-19 NOTE — ED Notes (Signed)
Patient Alert and oriented X4. Stable and ambulatory. Patient verbalized understanding of the discharge instructions.  Patient belongings were taken by the patient.  

## 2017-11-25 ENCOUNTER — Observation Stay (HOSPITAL_COMMUNITY)
Admission: EM | Admit: 2017-11-25 | Discharge: 2017-11-26 | Disposition: A | Discharge status: Home / Self Care | Payer: Medicare Other | Attending: Family Medicine | Admitting: Family Medicine

## 2017-11-25 ENCOUNTER — Other Ambulatory Visit: Payer: Self-pay

## 2017-11-25 ENCOUNTER — Emergency Department (HOSPITAL_COMMUNITY): Payer: Medicare Other

## 2017-11-25 ENCOUNTER — Encounter (HOSPITAL_COMMUNITY): Payer: Self-pay

## 2017-11-25 DIAGNOSIS — Z79899 Other long term (current) drug therapy: Secondary | ICD-10-CM | POA: Insufficient documentation

## 2017-11-25 DIAGNOSIS — Z7951 Long term (current) use of inhaled steroids: Secondary | ICD-10-CM | POA: Insufficient documentation

## 2017-11-25 DIAGNOSIS — E1151 Type 2 diabetes mellitus with diabetic peripheral angiopathy without gangrene: Secondary | ICD-10-CM | POA: Diagnosis not present

## 2017-11-25 DIAGNOSIS — Z86711 Personal history of pulmonary embolism: Secondary | ICD-10-CM | POA: Diagnosis not present

## 2017-11-25 DIAGNOSIS — I5022 Chronic systolic (congestive) heart failure: Secondary | ICD-10-CM | POA: Diagnosis not present

## 2017-11-25 DIAGNOSIS — Z85038 Personal history of other malignant neoplasm of large intestine: Secondary | ICD-10-CM | POA: Insufficient documentation

## 2017-11-25 DIAGNOSIS — I2510 Atherosclerotic heart disease of native coronary artery without angina pectoris: Secondary | ICD-10-CM | POA: Diagnosis not present

## 2017-11-25 DIAGNOSIS — R05 Cough: Secondary | ICD-10-CM | POA: Insufficient documentation

## 2017-11-25 DIAGNOSIS — R5381 Other malaise: Secondary | ICD-10-CM

## 2017-11-25 DIAGNOSIS — R079 Chest pain, unspecified: Secondary | ICD-10-CM | POA: Diagnosis not present

## 2017-11-25 DIAGNOSIS — Z86718 Personal history of other venous thrombosis and embolism: Secondary | ICD-10-CM | POA: Diagnosis not present

## 2017-11-25 DIAGNOSIS — E785 Hyperlipidemia, unspecified: Secondary | ICD-10-CM | POA: Insufficient documentation

## 2017-11-25 DIAGNOSIS — I110 Hypertensive heart disease with heart failure: Secondary | ICD-10-CM | POA: Diagnosis not present

## 2017-11-25 DIAGNOSIS — F0390 Unspecified dementia without behavioral disturbance: Secondary | ICD-10-CM | POA: Insufficient documentation

## 2017-11-25 DIAGNOSIS — E876 Hypokalemia: Secondary | ICD-10-CM

## 2017-11-25 DIAGNOSIS — Z9013 Acquired absence of bilateral breasts and nipples: Secondary | ICD-10-CM | POA: Insufficient documentation

## 2017-11-25 DIAGNOSIS — I252 Old myocardial infarction: Secondary | ICD-10-CM | POA: Insufficient documentation

## 2017-11-25 DIAGNOSIS — Z7984 Long term (current) use of oral hypoglycemic drugs: Secondary | ICD-10-CM | POA: Diagnosis not present

## 2017-11-25 DIAGNOSIS — Z66 Do not resuscitate: Secondary | ICD-10-CM | POA: Insufficient documentation

## 2017-11-25 DIAGNOSIS — M1990 Unspecified osteoarthritis, unspecified site: Secondary | ICD-10-CM | POA: Insufficient documentation

## 2017-11-25 DIAGNOSIS — F329 Major depressive disorder, single episode, unspecified: Secondary | ICD-10-CM | POA: Insufficient documentation

## 2017-11-25 DIAGNOSIS — E1140 Type 2 diabetes mellitus with diabetic neuropathy, unspecified: Secondary | ICD-10-CM | POA: Insufficient documentation

## 2017-11-25 DIAGNOSIS — Z7982 Long term (current) use of aspirin: Secondary | ICD-10-CM | POA: Insufficient documentation

## 2017-11-25 DIAGNOSIS — R059 Cough, unspecified: Secondary | ICD-10-CM

## 2017-11-25 DIAGNOSIS — J45909 Unspecified asthma, uncomplicated: Secondary | ICD-10-CM | POA: Insufficient documentation

## 2017-11-25 DIAGNOSIS — R072 Precordial pain: Secondary | ICD-10-CM | POA: Diagnosis not present

## 2017-11-25 DIAGNOSIS — Z96651 Presence of right artificial knee joint: Secondary | ICD-10-CM | POA: Insufficient documentation

## 2017-11-25 DIAGNOSIS — Z853 Personal history of malignant neoplasm of breast: Secondary | ICD-10-CM | POA: Diagnosis not present

## 2017-11-25 LAB — URINALYSIS, ROUTINE W REFLEX MICROSCOPIC
Bilirubin Urine: NEGATIVE
GLUCOSE, UA: NEGATIVE mg/dL
Hgb urine dipstick: NEGATIVE
Ketones, ur: NEGATIVE mg/dL
LEUKOCYTES UA: NEGATIVE
Nitrite: NEGATIVE
PH: 7 (ref 5.0–8.0)
Protein, ur: 100 mg/dL — AB
SPECIFIC GRAVITY, URINE: 1.013 (ref 1.005–1.030)

## 2017-11-25 LAB — BASIC METABOLIC PANEL
Anion gap: 10 (ref 5–15)
BUN: 10 mg/dL (ref 6–20)
CHLORIDE: 104 mmol/L (ref 101–111)
CO2: 24 mmol/L (ref 22–32)
Calcium: 10.9 mg/dL — ABNORMAL HIGH (ref 8.9–10.3)
Creatinine, Ser: 0.84 mg/dL (ref 0.44–1.00)
GFR, EST NON AFRICAN AMERICAN: 59 mL/min — AB (ref >60)
Glucose, Bld: 96 mg/dL (ref 65–99)
POTASSIUM: 3.1 mmol/L — AB (ref 3.5–5.1)
SODIUM: 138 mmol/L (ref 135–145)

## 2017-11-25 LAB — CBC
HEMATOCRIT: 33.3 % — AB (ref 36.0–46.0)
Hemoglobin: 10.2 g/dL — ABNORMAL LOW (ref 12.0–15.0)
MCH: 21.9 pg — ABNORMAL LOW (ref 26.0–34.0)
MCHC: 30.6 g/dL (ref 30.0–36.0)
MCV: 71.6 fL — ABNORMAL LOW (ref 78.0–100.0)
Platelets: 196 10*3/uL (ref 150–400)
RBC: 4.65 MIL/uL (ref 3.87–5.11)
RDW: 16.6 % — AB (ref 11.5–15.5)
WBC: 5 10*3/uL (ref 4.0–10.5)

## 2017-11-25 LAB — GLUCOSE, CAPILLARY: GLUCOSE-CAPILLARY: 100 mg/dL — AB (ref 65–99)

## 2017-11-25 LAB — I-STAT TROPONIN, ED: Troponin i, poc: 0.01 ng/mL (ref 0.00–0.08)

## 2017-11-25 LAB — BRAIN NATRIURETIC PEPTIDE: B Natriuretic Peptide: 101.8 pg/mL — ABNORMAL HIGH (ref 0.0–100.0)

## 2017-11-25 MED ORDER — ROSUVASTATIN CALCIUM 20 MG PO TABS
20.0000 mg | ORAL_TABLET | Freq: Every day | ORAL | Status: DC
  Administered 2017-11-25: 20 mg via ORAL
  Filled 2017-11-25: qty 1

## 2017-11-25 MED ORDER — ASPIRIN 81 MG PO CHEW
81.0000 mg | CHEWABLE_TABLET | Freq: Every day | ORAL | Status: DC
  Administered 2017-11-26: 81 mg via ORAL
  Filled 2017-11-25: qty 1

## 2017-11-25 MED ORDER — ENOXAPARIN SODIUM 40 MG/0.4ML ~~LOC~~ SOLN
40.0000 mg | SUBCUTANEOUS | Status: DC
  Administered 2017-11-26: 40 mg via SUBCUTANEOUS
  Filled 2017-11-25: qty 0.4

## 2017-11-25 MED ORDER — ALOE VESTA 2-N-1 PROTECTIVE EX OINT: TOPICAL_OINTMENT | Freq: Two times a day (BID) | CUTANEOUS | Status: DC | PRN

## 2017-11-25 MED ORDER — PRO-STAT SUGAR FREE PO LIQD
30.0000 mL | Freq: Two times a day (BID) | ORAL | Status: DC
  Administered 2017-11-26: 30 mL via ORAL
  Filled 2017-11-25: qty 30

## 2017-11-25 MED ORDER — POTASSIUM CHLORIDE CRYS ER 20 MEQ PO TBCR
20.0000 meq | EXTENDED_RELEASE_TABLET | Freq: Once | ORAL | Status: DC
  Filled 2017-11-25: qty 1

## 2017-11-25 MED ORDER — FAMOTIDINE 20 MG PO TABS
20.0000 mg | ORAL_TABLET | Freq: Two times a day (BID) | ORAL | Status: DC
  Administered 2017-11-25 – 2017-11-26 (×2): 20 mg via ORAL
  Filled 2017-11-25 (×2): qty 1

## 2017-11-25 MED ORDER — POTASSIUM CHLORIDE 20 MEQ/15ML (10%) PO SOLN
20.0000 meq | Freq: Once | ORAL | Status: AC
  Administered 2017-11-25: 20 meq via ORAL
  Filled 2017-11-25: qty 15

## 2017-11-25 MED ORDER — POLYVINYL ALCOHOL 1.4 % OP SOLN
1.0000 [drp] | Freq: Three times a day (TID) | OPHTHALMIC | Status: DC
  Administered 2017-11-26: 1 [drp] via OPHTHALMIC
  Filled 2017-11-25: qty 15

## 2017-11-25 MED ORDER — CLOPIDOGREL BISULFATE 75 MG PO TABS: 75.0000 mg | ORAL_TABLET | Freq: Every day | ORAL | Status: DC

## 2017-11-25 MED ORDER — ONDANSETRON HCL 4 MG/2ML IJ SOLN: 4.0000 mg | Freq: Four times a day (QID) | INTRAMUSCULAR | Status: DC | PRN

## 2017-11-25 MED ORDER — TRAMADOL HCL 50 MG PO TABS
50.0000 mg | ORAL_TABLET | Freq: Three times a day (TID) | ORAL | Status: DC | PRN
Start: 2017-11-25 — End: 2017-11-26

## 2017-11-25 MED ORDER — LORATADINE 10 MG PO TABS
5.0000 mg | ORAL_TABLET | Freq: Every day | ORAL | Status: DC
  Administered 2017-11-26: 5 mg via ORAL
  Filled 2017-11-25: qty 1

## 2017-11-25 MED ORDER — ALBUTEROL SULFATE (2.5 MG/3ML) 0.083% IN NEBU: 3.0000 mL | INHALATION_SOLUTION | Freq: Four times a day (QID) | RESPIRATORY_TRACT | Status: DC | PRN

## 2017-11-25 MED ORDER — AMLODIPINE BESYLATE 5 MG PO TABS
5.0000 mg | ORAL_TABLET | Freq: Every day | ORAL | Status: DC
  Administered 2017-11-26: 5 mg via ORAL
  Filled 2017-11-25: qty 1

## 2017-11-25 MED ORDER — OLOPATADINE HCL 0.1 % OP SOLN
1.0000 [drp] | Freq: Two times a day (BID) | OPHTHALMIC | Status: DC
  Administered 2017-11-26: 1 [drp] via OPHTHALMIC
  Filled 2017-11-25: qty 5

## 2017-11-25 MED ORDER — ACETAMINOPHEN 325 MG PO TABS: 650.0000 mg | ORAL_TABLET | ORAL | Status: DC | PRN

## 2017-11-25 MED ORDER — LOSARTAN POTASSIUM 50 MG PO TABS
50.0000 mg | ORAL_TABLET | Freq: Every day | ORAL | Status: DC
  Administered 2017-11-26: 50 mg via ORAL
  Filled 2017-11-25: qty 1

## 2017-11-25 MED ORDER — FERROUS SULFATE 325 (65 FE) MG PO TABS
325.0000 mg | ORAL_TABLET | Freq: Every day | ORAL | Status: DC
  Administered 2017-11-26: 325 mg via ORAL
  Filled 2017-11-25: qty 1

## 2017-11-25 MED ORDER — GUAIFENESIN 100 MG/5ML PO SYRP
200.0000 mg | ORAL_SOLUTION | Freq: Three times a day (TID) | ORAL | Status: DC | PRN
  Filled 2017-11-25: qty 10

## 2017-11-25 MED ORDER — CARVEDILOL 12.5 MG PO TABS
12.5000 mg | ORAL_TABLET | Freq: Two times a day (BID) | ORAL | Status: DC
  Administered 2017-11-26: 12.5 mg via ORAL
  Filled 2017-11-25: qty 1

## 2017-11-25 MED ORDER — SENNOSIDES-DOCUSATE SODIUM 8.6-50 MG PO TABS
2.0000 | ORAL_TABLET | Freq: Every day | ORAL | Status: DC
  Administered 2017-11-25: 2 via ORAL
  Filled 2017-11-25: qty 2

## 2017-11-25 NOTE — ED Notes (Signed)
Attempted blood draw x 2  

## 2017-11-25 NOTE — ED Provider Notes (Signed)
[facility] EMERGENCY DEPARTMENT Provider Note   CSN: [id number] Arrival date & time: 11/25/17  1751     History   Chief Complaint Chief Complaint  Patient presents with  . Chest Pain    HPI [name] is a 84 y.o. female.  The history is provided by the patient and a relative.  Chest Pain   This is a new problem. The current episode started yesterday. The problem occurs constantly. The problem has not changed since onset.The pain is associated with coughing. The pain is present in the substernal region. The pain is at a severity of 5/10. The quality of the pain is described as stabbing. The pain radiates to the left arm. Associated symptoms include cough, dizziness (with position of head - brief), shortness of breath and sputum production. Pertinent negatives include no abdominal pain, no back pain, no diaphoresis, no fever, no headaches, no leg pain, no lower extremity edema, no nausea, no numbness, no palpitations and no vomiting. She has tried nothing for the symptoms. The treatment provided no relief. Risk factors include being elderly.  Her past medical history is significant for CAD, cancer, CHF and diabetes.  Pertinent negatives for past medical history include no seizures.     84 year old female complaining of cough productive of yellow sputum chills left-sided chest pain with cough left arm discomfort and positional dizziness.  Patient lives at home with her family.  She states she just felt sick since yesterday.  She has been coughing bringing up the sputum pain with cough.  She denies any sick contacts.  Past Medical History:  Diagnosis Date  . Allergy   . Anemia    Iron def anemia  . Arthritis   . Asthma   . Breast cancer ([place]) 10/2010   Invasive Ductal Carcinoma, s/p bilateral mastectomy, now on Tamoxifen. Followed by Dr Jamse Arn.   . Breast cancer ([place]) 2011   s/p Bilateral masectomy  . CAD (coronary artery disease)   . Carotid artery aneurysm  ([place]) 08/2010   right ICA, 5 x 63mm  . CHF (congestive heart failure) (HCC)    Systolic   . Colon cancer ([place])   . Colon cancer ([place]) 12/11/2013  . DDD (degenerative disc disease), cervical   . Diabetes mellitus   . Diverticulosis   . GERD (gastroesophageal reflux disease)   . H/O: GI bleed   . Hiatal hernia   . History of colon cancer   . Hypercalcemia 06/09/2014  . Hyperlipidemia   . Hypertension   . Myocardial infarction ([place])   . Neuropathy   . Pneumonia 03/2012  . Shortness of breath     Patient Active Problem List   Diagnosis Date Noted  . Left knee pain 08/09/2017  . Protein-calorie malnutrition, severe 02/06/2017  . Hypokalemia 10/13/2016  . Pressure injury of skin 08/31/2016  . Pulmonary embolus ([place]) 08/30/2016  . Mild dementia 06/01/2016  . Depression 01/20/2016  . Suicidal ideation 01/19/2016  . Prolapse of female pelvic organs 12/28/2015  . Leg weakness, bilateral 12/08/2015  . Weakness of right lower extremity 11/25/2015  . Dry eyes 10/28/2015  . (HFpEF) heart failure with preserved ejection fraction ([place])   . CAD in native artery   . Gout 05/14/2015  . Dyslipidemia   . Essential hypertension   . Seasonal allergies 02/10/2015  . Stroke-like symptoms   . Stroke-like episode ([place]) 01/25/2015  . Syncope and collapse 01/25/2015  . Dizziness 09/19/2014  . Anemia of chronic illness 08/19/2014  .  Acute superficial venous thrombosis of right lower extremity 08/19/2014  . Hypercalcemia 06/09/2014  . Breast cancer, left breast ([place]) 06/09/2014  . Breast cancer, right breast ([place]) 06/09/2014  . Edema of left lower extremity 03/30/2014  . PAD (peripheral artery disease) ([place]) 03/30/2014  . History of colon cancer 12/11/2013  . History of fall 05/15/2013  . Bilateral leg pain 03/07/2013  . Lower extremity edema 02/19/2013  . Stricture and stenosis of esophagus 10/27/2012  . Constipation 06/24/2012  . Thoracic back pain 05/24/2012  . Tobacco abuse 06/02/2011    . History of breast cancer in female, bilateral.  Mastectomies 10/24/2010. 04/26/2011  . Hiatal hernia 03/22/2011  . Colon polyp 03/22/2011  . Poor circulation 03/22/2011  . Cough 01/31/2011  . 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 Neuropathy ([place]) 08/25/2009  . ANEMIA-IRON DEFICIENCY 04/29/2007  . HLD (hyperlipidemia) 01/29/2007  . Controlled type 2 diabetes mellitus without complication, without long-term current use of insulin ([place]) 01/10/2007  . HYPERTENSION, BENIGN SYSTEMIC 01/10/2007  . Coronary atherosclerosis 01/10/2007  . Chronic systolic heart failure ([place]) 01/10/2007  . Asthma 01/10/2007  . Reflux esophagitis 01/10/2007  . Osteoarthritis, multiple sites 01/10/2007    Past Surgical History:  Procedure Laterality Date  . Breast masectomy  10/2010   Bilateral masectomy by Dr Lucia Gaskins s/p invasive ductal carcinoma.  [facility] BREAST SURGERY    . Cardiac  Catherization  2007   Severe 3-vessel disease.  EF 20-25%.  . ESOPHAGOGASTRODUODENOSCOPY  2001   Esophageal Tear  . ESOPHAGOGASTRODUODENOSCOPY  10/27/2012   Procedure: ESOPHAGOGASTRODUODENOSCOPY (EGD);  Surgeon: Irene Shipper, MD;  Location: [facility] ENDOSCOPY;  Service: Endoscopy;  Laterality: N/A;  pat  . JOINT REPLACEMENT Right 2001   Knee  . TOTAL KNEE ARTHROPLASTY  2001    OB History    No data available       Home Medications    Prior to Admission medications   Medication Sig Start Date End Date Taking? Authorizing Provider  acetaminophen (TYLENOL) 500 MG tablet Take 500 mg by mouth every 6 (six) hours as needed for moderate pain.     [provider]  albuterol (PROVENTIL HFA;VENTOLIN HFA) 108 (90 BASE) MCG/ACT inhaler Inhale 2 puffs into the lungs every 6 (six) hours as needed. For shortness of breath Patient taking differently: Inhale 2 puffs into the lungs every 6 (six) hours as needed for shortness of breath.  01/18/15   [name], MD  Amino Acids-Protein Hydrolys (FEEDING SUPPLEMENT, PRO-STAT  SUGAR FREE 64,) LIQD Take 30 mLs by mouth 2 (two) times daily with a meal. ensure    [provider]  amLODipine (NORVASC) 5 MG tablet Take 1 tablet (5 mg total) by mouth daily. 06/25/17   [place] Bing, DO  aspirin (ASPIRIN CHILDRENS) 81 MG chewable tablet Chew 81 mg by mouth daily.     [provider]  capsaicin (ZOSTRIX) 0.025 % cream APPLY TO AFFECTED AREA TWICE A DAY Patient not taking: Reported on 11/19/2017 04/11/16   [name], MD  carvedilol (COREG) 12.5 MG tablet TAKE 1 TABLET BY MOUTH TWICE A DAY WITH A MEAL Patient taking differently: TAKE 12.5  TABLET BY MOUTH TWICE A DAY WITH A MEAL 10/25/17   [place] Bing, DO  clopidogrel (PLAVIX) 75 MG tablet TAKE 1 TABLET BY MOUTH EVERY DAY Patient not taking: Reported on 09/28/2017 04/16/17   [name], MD  ferrous sulfate 325 (65 FE) MG tablet Take 1 tablet (325 mg total) by mouth daily with  breakfast. Patient not taking: Reported on 09/28/2017 11/21/16   [name], MD  guaifenesin (ROBITUSSIN) 100 MG/5ML syrup Take 10 mLs (200 mg total) by mouth 3 (three) times daily as needed for cough. 08/15/16   [name], MD  hydroxypropyl methylcellulose / hypromellose (ISOPTO TEARS / GONIOVISC) 2.5 % ophthalmic solution Place 1 drop 3 (three) times daily into both eyes. Patient not taking: Reported on 11/19/2017 09/28/17   [place] Bing, DO  loratadine (CLARITIN) 10 MG tablet TAKE 1/2 TABLET BY MOUTH DAILY Patient taking differently: TAKE 5 mg  TABLET BY MOUTH as needed 12/08/15   [name], MD  losartan (COZAAR) 50 MG tablet TAKE 1 TABLET (50 MG TOTAL) BY MOUTH AT BEDTIME. 06/25/17   [place] Bing, DO  metFORMIN (GLUCOPHAGE) 500 MG tablet Take 1 tablet (500 mg total) by mouth daily with breakfast. 07/31/17   [place] Bing, DO  PATADAY 0.2 % SOLN APPLY 1 DROP TO EYE DAILY. Patient taking differently: Instill 1 drop into each eye once a day 11/23/15   [name], MD    petrolatum-hydrophilic-aloe vera (ALOE VESTA) ointment Apply topically 2 (two) times daily as needed for wound care. Patient not taking: Reported on 11/19/2017 05/10/17   Ok Edwards, PA-C  ranitidine (ZANTAC) 150 MG tablet TAKE 1 TABLET BY MOUTH TWICE A DAY Patient taking differently: TAKE 150 mg TABLET BY daily 10/15/17   [place] Bing, DO  rosuvastatin (CRESTOR) 20 MG tablet TAKE 1 TABLET (20 MG TOTAL) BY MOUTH AT BEDTIME. 08/20/17   [place] Bing, DO  senna-docusate (SENOKOT-S) 8.6-50 MG tablet Take 2 tablets by mouth at bedtime. Patient taking differently: Take 1 tablet by mouth every other day.  04/23/17   Virginia Crews, MD  silver sulfADIAZINE (SILVADENE) 1 % cream Apply 1 application topically daily. Patient taking differently: Apply 1 application topically daily as needed.  05/15/17   Tuchman, Richard Loletha Grayer, DPM  traMADol (ULTRAM) 50 MG tablet Take 1 tablet (50 mg total) by mouth every 8 (eight) hours as needed for severe pain. 08/15/17    Bing, DO    Family History Family History  Problem Relation Age of Onset  . Sickle cell anemia Other   . Heart disease Mother   . Heart disease Father   . Cancer Daughter 45       breast ca  . Hypertension Daughter   . Cancer Daughter 14       breast ca  . Hypertension Daughter   . Heart disease Son        Poor circulation-Left Leg  . Diabetes Daughter   . Hypertension Daughter   . Hyperlipidemia Daughter        Poor circulation- Toe amputation    Social History Social History   Tobacco Use  . Smoking status: Never Smoker  . Smokeless tobacco: Current User    Types: Snuff  Substance Use Topics  . Alcohol use: No  . Drug use: No     Allergies   Tape and Lisinopril   Review of Systems Review of Systems  Constitutional: Negative for chills, diaphoresis and fever.  HENT: Negative for ear pain and sore throat.   Eyes: Negative for pain and visual disturbance.  Respiratory: Positive for cough, sputum production  and shortness of breath.   Cardiovascular: Positive for chest pain. Negative for palpitations.  Gastrointestinal: Negative for abdominal pain, nausea and vomiting.  Genitourinary: Negative for dysuria and hematuria.  Musculoskeletal: Negative for  arthralgias and back pain.  Skin: Negative for color change and rash.  Neurological: Positive for dizziness (with position of head - brief). Negative for seizures, syncope, numbness and headaches.  All other systems reviewed and are negative.    Physical Exam Updated Vital Signs BP (!) 155/65   Pulse 74   Temp 98.4 F (36.9 C)   Resp 17   SpO2 100%   Physical Exam  Constitutional: She appears well-developed and well-nourished. No distress.  HENT:  Head: Normocephalic and atraumatic.  Eyes: Conjunctivae are normal.  Neck: Neck supple.  Cardiovascular: Normal rate, regular rhythm, intact distal pulses and normal pulses.  No murmur heard.  No systolic murmur is present.  No diastolic murmur is present. Pulses:      Radial pulses are 2+ on the right side, and 2+ on the left side.       Posterior tibial pulses are 2+ on the right side, and 2+ on the left side.  Pulmonary/Chest: Effort normal and breath sounds normal. No stridor. No respiratory distress.  Abdominal: Soft. There is no tenderness.  Musculoskeletal: She exhibits no edema.       Right lower leg: Normal. She exhibits no edema.       Left lower leg: Normal. She exhibits no edema.  Neurological: She is alert.  Skin: Skin is warm and dry. Capillary refill takes less than 2 seconds.  Psychiatric: She has a normal mood and affect.  Nursing note and vitals reviewed.    ED Treatments / Results  Labs (all labs ordered are listed, but only abnormal results are displayed) Labs Reviewed  BASIC METABOLIC PANEL - Abnormal; Notable for the following components:      Result Value   Potassium 3.1 (*)    Calcium 10.9 (*)    GFR calc non Af Amer 59 (*)    All other components within  normal limits  CBC - Abnormal; Notable for the following components:   Hemoglobin 10.2 (*)    HCT 33.3 (*)    MCV 71.6 (*)    MCH 21.9 (*)    RDW 16.6 (*)    All other components within normal limits  BRAIN NATRIURETIC PEPTIDE - Abnormal; Notable for the following components:   B Natriuretic Peptide 101.8 (*)    All other components within normal limits  URINALYSIS, ROUTINE W REFLEX MICROSCOPIC - Abnormal; Notable for the following components:   APPearance HAZY (*)    Protein, ur 100 (*)    Bacteria, UA RARE (*)    Squamous Epithelial / LPF 0-5 (*)    All other components within normal limits  URINE CULTURE  I-STAT TROPONIN, ED    EKG  EKG Interpretation  Date/Time:  Sunday November 25 2017 17:54:50 EST Ventricular Rate:  74 PR Interval:    QRS Duration: 79 QT Interval:  393 QTC Calculation: 439 R Axis:   0 Text Interpretation:  Sinus rhythm Abnormal R-wave progression, early transition no acute st/ts, nl intervals Confirmed by Aletta Edouard [id number]) on 11/25/2017 6:59:12 PM       Radiology Dg Chest 2 View  Result Date: 11/25/2017 CLINICAL DATA:  Generalized chest pain for 1 day. Cough. Upper mid chest and rib pain for the past 2 weeks. EXAM: CHEST  2 VIEW COMPARISON:  02/03/2017 and chest CTA dated 08/30/2016. FINDINGS: Normal sized heart. Interval minimal linear density at both lung bases. Stable small calcific densities overlying both apical regions. Thoracic and upper lumbar spine degenerative changes. Coronary artery stent. IMPRESSION:  Interval minimal bibasilar linear atelectasis or scarring. Electronically Signed   By: Claudie Revering M.D.   On: 11/25/2017 18:41    Procedures Procedures (including critical care time)  Medications Ordered in ED Medications - No data to display   Initial Impression / Assessment and Plan / ED Course  I have reviewed the triage vital signs and the nursing notes.  Pertinent labs & imaging results that were available during my care of  the patient were reviewed by me and considered in my medical decision making (see chart for details).  Clinical Course as of Nov 25 2157  Nancy Fetter Nov 25, 2017  2111 Discussed with family practice team for admission and they will evaluate the patient in the emergency department.   [MB]    Clinical Course User Index [MB] Hayden Rasmussen, MD    84 year old female with history of cancer and coronary disease with stents here with cough and chest pain.  She also has positional dizziness.  Her chest x-ray is negative for pneumonia and her initial troponin is negative and EKG is nonischemic appearing.  With a heart score being 4 she will be admitted to the hospital for serial enzymes.  Her potassium was also low and this was repleted orally.  Differential diagnosis is acute coronary syndrome, pneumonia, pneumothorax.  Final Clinical Impressions(s) / ED Diagnoses   Final diagnoses:  Chest pain, unspecified type  Cough in adult  Hypokalemia    ED Discharge Orders    None       Hayden Rasmussen, MD 11/25/17 2201

## 2017-11-25 NOTE — H&P (Signed)
[facility] Admission History and Physical Service Pager: [phone]  [name] Medical record number: [id number] Date of birth: 25-Jan-1924 Age: 84 y.o. Gender: female  Primary Care Provider: Houck Bing, DO Consultants: none Code Status: DNR/DNI  Chief Complaint: chest pain  Assessment and Plan: [name] is a 84 y.o. female presenting with 2 day history of chest pain. Admitted for ACS rule out.  Chest Pain Given that chest pain started almost immediately after a violent coughing spell, is reproducible, is a dull ache, and does not radiate it is most likely musculoskeletal especially in the setting of an essentially normal ekg, troponins. Other items on differential are ACS, PE, rib fracture, lymphedema. While ACS is unlikely at this point, will continue to trend troponins and repeat ekg in am especially given history of ACS and heart score of 4.  A PE is very unlikely. Well's score of 1.5 only because patient has had a previous PE/DVT. Patient with no history of recent fall, and only possible source of trauma is coughing. She reports that pain is not currently present or reproducible making rib fracture very unlikely. Patient with history of bilateral mastectomy. Is possible she has lymphedema but her pain does not fit what would be expected. Unlikely given lack of swelling in chest wall. - admit to telemetry, Dr. Andria Frames, Observation status - vital signs per floor routine - cardiac monitoring - O2 supplementation as needed to keeps sats >92% - aspirin 81mg  - trend troponins q 6 hours - tylenol as needed for pain - home tramadol as needed for severe pain - lovenox 40mg  daily for dvt ppx - EKG in am - cbc, cmp, mg, phos in am - consider Echo if ekg abnormal, or having worsening chest pain - prn zofran - crestor 20mg  daily  Cough/URI Given her accompanying congestion it seems likely that he cough could be from a URI. Especially in  setting of yellow turning to clear sputum. Other items on differential could be CHF, COPD, or GERD. CHF is unlikely given her symptoms although her slightly elevated BNP and 1+ edema could argue for this diagnosis. Unlikely to be COPD given that she has no documented diagnosis of COPD. [name] is possible given that she already has this diagnosis and has perhaps been more lax on taking her meds recently. Not related to sleeping or after eating makes this less likely. UTD on flu vaccination. - supplemental O2 as needed - consider repeat cxr if cough worsens - robitussin 200mg  tid prn - consider adding tessilon perles - Claritin 5mg  daily  Anemia Hemoglobin of 10.2. Appears to actually be higher than baseline of 9.5 Will continue to monitor. Given MCV of 71 likely to be either IDA or Anemia of chronic disease. Last ferritin 421 in 2015. Will consider repeat iron panel while inpatient. - continue ferrous sulfate - consider iron panel while inpatient  HF with mixed preserved and reduced EF: last echo 2016 with EF 50-55% and G1DD. Clinically has 1+ pitting edema on exam. BNP lower than in 2017 at last hospitalization. Unlikely to need echo during this admission.  Hypertension BP 158/59 in ed. Continue home meds. Possible that chest pain is contributing. - continue coreg 12.5mg  - continue amlodipine 5mg  - losartan 50mg  daily  Type II diabetes Last A1C at 7.7 in 07/2017. Takes metformin as outpatient. Will place on sSSI while inpatient. Will draw A1C. - sSSI, AC/HS BS checks - recheck A1C  PMH is significant for anemia,  asthma, Breast cancer, CAD, Systolic heart failure, V5IE, depression, HLD, colon cancer, hypercalcemia, HTN, Neuropathy, osteoarthritis, PAD, PE, syncope.  FEN/GI: carb-modified diet Prophylaxis: lovenox  Disposition: home   History of Present Illness:  [name] is a 84 y.o. female presenting with 2 day history of chest pain. She states that this pain first started on 1/12  at night. She did not think too much about it but states that she woke up to a much worse pain in the morning of 1/13. At that time she decided to come to the emergency room. She states that the pain is more like a dull ache that feels musculoskeletal. It does not radiate anywhere. It is limited to essentially one point in her lower thorax, on the leftside below where her left breast used to be. She states that she has had a progressively worsening dry cough for the last two weeks. She had a particularly bad day on 1/12 and states that the pain started after a particularly violent coughing spell.  Regarding her coughing she states that it was initially productive of yellowish sputum. That has since turned into whitish sputum within the last few days. She has had congestion for a similar time periods. States that she has had rhinorrhea and watery eyes along with her cough.  Workup in the ED significant for cbc, bmp, BNP, troponin, ua, ekg, cxr. Bmp significant only for K of 3.1, and calcium of 10.9. BNP 101.8. CBC only significant for hgb of 10.2 (baseline 9.5). Trop of 0.01. ekg showed no stemi with only questionable r-wave abnormality. CXR with only minimal bibasilar atelectasis or scarring. UA only with protein, otherwise negative.   Review Of Systems: Per HPI with the following additions:  Review of Systems  Constitutional: Positive for chills. Negative for fever.  HENT: Positive for congestion, sinus pain and sore throat. Negative for hearing loss.   Eyes: Positive for discharge (profuse watery discharge).  Respiratory: Positive for cough and sputum production. Negative for hemoptysis, shortness of breath, wheezing and stridor.   Cardiovascular: Positive for chest pain and leg swelling. Negative for palpitations, orthopnea, claudication and PND.  Gastrointestinal: Negative for abdominal pain, constipation, diarrhea, nausea and vomiting.  Genitourinary: Negative for dysuria, frequency and urgency.   Musculoskeletal: Negative for myalgias.  Neurological: Negative for dizziness and headaches.    Patient Active Problem List   Diagnosis Date Noted  . Left knee pain 08/09/2017  . Protein-calorie malnutrition, severe 02/06/2017  . Hypokalemia 10/13/2016  . Pressure injury of skin 08/31/2016  . Pulmonary embolus ([place]) 08/30/2016  . Mild dementia 06/01/2016  . Depression 01/20/2016  . Suicidal ideation 01/19/2016  . Prolapse of female pelvic organs 12/28/2015  . Leg weakness, bilateral 12/08/2015  . Weakness of right lower extremity 11/25/2015  . Dry eyes 10/28/2015  . (HFpEF) heart failure with preserved ejection fraction ([place])   . CAD in native artery   . Gout 05/14/2015  . Dyslipidemia   . Essential hypertension   . Seasonal allergies 02/10/2015  . Stroke-like symptoms   . Stroke-like episode ([place]) 01/25/2015  . Syncope and collapse 01/25/2015  . Dizziness 09/19/2014  . Anemia of chronic illness 08/19/2014  . Acute superficial venous thrombosis of right lower extremity 08/19/2014  . Hypercalcemia 06/09/2014  . Breast cancer, left breast ([place]) 06/09/2014  . Breast cancer, right breast ([place]) 06/09/2014  . Edema of left lower extremity 03/30/2014  . PAD (peripheral artery disease) ([place]) 03/30/2014  . History of colon cancer 12/11/2013  . History  of fall 05/15/2013  . Bilateral leg pain 03/07/2013  . Lower extremity edema 02/19/2013  . Stricture and stenosis of esophagus 10/27/2012  . Constipation 06/24/2012  . Thoracic back pain 05/24/2012  . Tobacco abuse 06/02/2011  . History of breast cancer in female, bilateral.  Mastectomies 10/24/2010. 04/26/2011  . Hiatal hernia 03/22/2011  . Colon polyp 03/22/2011  . Poor circulation 03/22/2011  . Cough 01/31/2011  . 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 Neuropathy ([place]) 08/25/2009  . ANEMIA-IRON DEFICIENCY 04/29/2007  . HLD (hyperlipidemia) 01/29/2007  . Controlled type 2 diabetes mellitus without complication,  without long-term current use of insulin ([place]) 01/10/2007  . HYPERTENSION, BENIGN SYSTEMIC 01/10/2007  . Coronary atherosclerosis 01/10/2007  . Chronic systolic heart failure ([place]) 01/10/2007  . Asthma 01/10/2007  . Reflux esophagitis 01/10/2007  . Osteoarthritis, multiple sites 01/10/2007    Past Medical History: Past Medical History:  Diagnosis Date  . Allergy   . Anemia    Iron def anemia  . Arthritis   . Asthma   . Breast cancer ([place]) 10/2010   Invasive Ductal Carcinoma, s/p bilateral mastectomy, now on Tamoxifen. Followed by Dr Jamse Arn.   . Breast cancer ([place]) 2011   s/p Bilateral masectomy  . CAD (coronary artery disease)   . Carotid artery aneurysm ([place]) 08/2010   right ICA, 5 x 14mm  . CHF (congestive heart failure) (H[facility])    Systolic   . Colon cancer ([place])   . Colon cancer ([place]) 12/11/2013  . DDD (degenerative disc disease), cervical   . Diabetes mellitus   . Diverticulosis   . GERD (gastroesophageal reflux disease)   . H/O: GI bleed   . Hiatal hernia   . History of colon cancer   . Hypercalcemia 06/09/2014  . Hyperlipidemia   . Hypertension   . Myocardial infarction ([place])   . Neuropathy   . Pneumonia 03/2012  . Shortness of breath     Past Surgical History: Past Surgical History:  Procedure Laterality Date  . Breast masectomy  10/2010   Bilateral masectomy by Dr Lucia Gaskins s/p invasive ductal carcinoma.  Marland Kitchen BREAST SURGERY    . Cardiac  Catherization  2007   Severe 3-vessel disease.  EF 20-25%.  . ESOPHAGOGASTRODUODENOSCOPY  2001   Esophageal Tear  . ESOPHAGOGASTRODUODENOSCOPY  10/27/2012   Procedure: ESOPHAGOGASTRODUODENOSCOPY (EGD);  Surgeon: Irene Shipper, MD;  [facility];  Service: Endoscopy;  Laterality: N/A;  pat  . JOINT REPLACEMENT Right 2001   Knee  . TOTAL KNEE ARTHROPLASTY  2001    Social History: Social History   Tobacco Use  . Smoking status: Never Smoker  . Smokeless tobacco: Current User    Types: Snuff  Substance Use Topics   . Alcohol use: No  . Drug use: No   Additional social history:   Please also refer to relevant sections of EMR.  Family History: Family History  Problem Relation Age of Onset  . Sickle cell anemia Other   . Heart disease Mother   . Heart disease Father   . Cancer Daughter 66       breast ca  . Hypertension Daughter   . Cancer Daughter 79       breast ca  . Hypertension Daughter   . Heart disease Son        Poor circulation-Left Leg  . Diabetes Daughter   . Hypertension Daughter   . Hyperlipidemia Daughter        Poor circulation- Toe amputation    Allergies  and Medications: Allergies  Allergen Reactions  . Tape Other (See Comments)    Skin is very thin and will tear and bruise easily!!  . Lisinopril Cough   No current facility-administered medications on file prior to encounter.    Current Outpatient Medications on File Prior to Encounter  Medication Sig Dispense Refill  . acetaminophen (TYLENOL) 500 MG tablet Take 500 mg by mouth every 6 (six) hours as needed for moderate pain.     Marland Kitchen albuterol (PROVENTIL HFA;VENTOLIN HFA) 108 (90 BASE) MCG/ACT inhaler Inhale 2 puffs into the lungs every 6 (six) hours as needed. For shortness of breath (Patient taking differently: Inhale 2 puffs into the lungs every 6 (six) hours as needed for shortness of breath. ) 1 Inhaler 6  . Amino Acids-Protein Hydrolys (FEEDING SUPPLEMENT, PRO-STAT SUGAR FREE 64,) LIQD Take 30 mLs by mouth 2 (two) times daily with a meal. ensure    . amLODipine (NORVASC) 5 MG tablet Take 1 tablet (5 mg total) by mouth daily. 30 tablet 6  . aspirin (ASPIRIN CHILDRENS) 81 MG chewable tablet Chew 81 mg by mouth daily.     . capsaicin (ZOSTRIX) 0.025 % cream APPLY TO AFFECTED AREA TWICE A DAY (Patient not taking: Reported on 11/19/2017) 60 g 1  . carvedilol (COREG) 12.5 MG tablet TAKE 1 TABLET BY MOUTH TWICE A DAY WITH A MEAL (Patient taking differently: TAKE 12.5  TABLET BY MOUTH TWICE A DAY WITH A MEAL) 60 tablet 6  .  clopidogrel (PLAVIX) 75 MG tablet TAKE 1 TABLET BY MOUTH EVERY DAY (Patient not taking: Reported on 09/28/2017) 30 tablet 1  . ferrous sulfate 325 (65 FE) MG tablet Take 1 tablet (325 mg total) by mouth daily with breakfast. (Patient not taking: Reported on 09/28/2017) 30 tablet 2  . guaifenesin (ROBITUSSIN) 100 MG/5ML syrup Take 10 mLs (200 mg total) by mouth 3 (three) times daily as needed for cough. 120 mL 0  . hydroxypropyl methylcellulose / hypromellose (ISOPTO TEARS / GONIOVISC) 2.5 % ophthalmic solution Place 1 drop 3 (three) times daily into both eyes. (Patient not taking: Reported on 11/19/2017) 15 mL 12  . loratadine (CLARITIN) 10 MG tablet TAKE 1/2 TABLET BY MOUTH DAILY (Patient taking differently: TAKE 5 mg  TABLET BY MOUTH as needed) 15 tablet 0  . losartan (COZAAR) 50 MG tablet TAKE 1 TABLET (50 MG TOTAL) BY MOUTH AT BEDTIME. 30 tablet 6  . metFORMIN (GLUCOPHAGE) 500 MG tablet Take 1 tablet (500 mg total) by mouth daily with breakfast. 90 tablet 3  . PATADAY 0.2 % SOLN APPLY 1 DROP TO EYE DAILY. (Patient taking differently: Instill 1 drop into each eye once a day) 2.5 mL 0  . petrolatum-hydrophilic-aloe vera (ALOE VESTA) ointment Apply topically 2 (two) times daily as needed for wound care. (Patient not taking: Reported on 11/19/2017) 226 g 0  . ranitidine (ZANTAC) 150 MG tablet TAKE 1 TABLET BY MOUTH TWICE A DAY (Patient taking differently: TAKE 150 mg TABLET BY daily) 60 tablet 3  . rosuvastatin (CRESTOR) 20 MG tablet TAKE 1 TABLET (20 MG TOTAL) BY MOUTH AT BEDTIME. 30 tablet 6  . senna-docusate (SENOKOT-S) 8.6-50 MG tablet Take 2 tablets by mouth at bedtime. (Patient taking differently: Take 1 tablet by mouth every other day. ) 60 tablet 3  . silver sulfADIAZINE (SILVADENE) 1 % cream Apply 1 application topically daily. (Patient taking differently: Apply 1 application topically daily as needed. ) 50 g 0  . traMADol (ULTRAM) 50 MG tablet Take 1  tablet (50 mg total) by mouth every 8 (eight)  hours as needed for severe pain. 30 tablet 0  . [DISCONTINUED] cetirizine (ZYRTEC) 5 MG tablet Take 1 tablet (5 mg total) by mouth daily. Take at night time as it can cause some drowsiness 30 tablet 4    Objective: BP (!) 153/63   Pulse 70   Temp 98.4 F (36.9 C)   Resp (!) 8   SpO2 100%  Exam: General: alert, oriented x3. No acute distress. African American female with numerous blankets on Eyes: eomi, perrla, wears glasses ENTM: cerumen present bilateral ears, no lymphadenopathy in cervical chain. MMM Cardiovascular: rrr, palpable radial pulse bilaterally, palpable dp/pt bilateral LE Respiratory: lungs clear to ausculation bilaterally, tender to palpation in ribs in anterior, lower thoracic distribution Gastrointestinal: soft, non-tender, non-distended. MSK: 5/5 strength all muscle distributions BUE, BLE, chest pain non-reproducible Derm: warm, dry. Dead skin noted between multiple toes BLE Neuro: cn 2-12 intact, 5/5 strength all muscle groups BUE, BLE, no focal neuro deficits Psych: appropriate, pleasant  Labs and Imaging: CBC BMET  Recent Labs  Lab 11/25/17 1929  WBC 5.0  HGB 10.2*  HCT 33.3*  PLT 196   Recent Labs  Lab 11/25/17 1929  NA 138  K 3.1*  CL 104  CO2 24  BUN 10  CREATININE 0.84  GLUCOSE 96  CALCIUM 10.9Guadalupe Dawn, MD 11/25/2017, 10:15 PM PGY-1, [place] Intern pager: [phone], text pages welcome  FPTS Upper-Level Resident Addendum  I have independently interviewed and examined the patient. I have discussed the above with the original author and agree with their documentation. My edits for correction/addition/clarification are in green. Please see also any attending notes.   Rosana Berger, MD PGY-2, [place] Service pager: [phone] (text pages welcome through [facility])

## 2017-11-25 NOTE — ED Triage Notes (Signed)
Pt from home via [facility] for CP x 1 day. Per EMS, pt reports cough and upper mid chest/rib pain. Pt reports she has taken OTC robitussin with some relief. Pt A&Ox4. 324 ASA given en route. EMS VS: 160/70, 80 HR, 16 RR, 99% on RA, 167 CBG.

## 2017-11-26 ENCOUNTER — Telehealth: Payer: Self-pay | Admitting: Family Medicine

## 2017-11-26 DIAGNOSIS — R5381 Other malaise: Secondary | ICD-10-CM | POA: Diagnosis not present

## 2017-11-26 DIAGNOSIS — R079 Chest pain, unspecified: Secondary | ICD-10-CM | POA: Diagnosis not present

## 2017-11-26 DIAGNOSIS — I110 Hypertensive heart disease with heart failure: Secondary | ICD-10-CM | POA: Diagnosis not present

## 2017-11-26 DIAGNOSIS — J45909 Unspecified asthma, uncomplicated: Secondary | ICD-10-CM | POA: Diagnosis not present

## 2017-11-26 DIAGNOSIS — R05 Cough: Secondary | ICD-10-CM | POA: Diagnosis not present

## 2017-11-26 DIAGNOSIS — E785 Hyperlipidemia, unspecified: Secondary | ICD-10-CM | POA: Diagnosis not present

## 2017-11-26 DIAGNOSIS — E1151 Type 2 diabetes mellitus with diabetic peripheral angiopathy without gangrene: Secondary | ICD-10-CM | POA: Diagnosis not present

## 2017-11-26 DIAGNOSIS — I2510 Atherosclerotic heart disease of native coronary artery without angina pectoris: Secondary | ICD-10-CM | POA: Diagnosis not present

## 2017-11-26 DIAGNOSIS — Z66 Do not resuscitate: Secondary | ICD-10-CM | POA: Diagnosis not present

## 2017-11-26 DIAGNOSIS — I5022 Chronic systolic (congestive) heart failure: Secondary | ICD-10-CM | POA: Diagnosis not present

## 2017-11-26 DIAGNOSIS — Z7984 Long term (current) use of oral hypoglycemic drugs: Secondary | ICD-10-CM | POA: Diagnosis not present

## 2017-11-26 DIAGNOSIS — E876 Hypokalemia: Secondary | ICD-10-CM | POA: Diagnosis not present

## 2017-11-26 DIAGNOSIS — R0782 Intercostal pain: Secondary | ICD-10-CM

## 2017-11-26 DIAGNOSIS — R059 Cough, unspecified: Secondary | ICD-10-CM

## 2017-11-26 DIAGNOSIS — E1140 Type 2 diabetes mellitus with diabetic neuropathy, unspecified: Secondary | ICD-10-CM | POA: Diagnosis not present

## 2017-11-26 DIAGNOSIS — M1990 Unspecified osteoarthritis, unspecified site: Secondary | ICD-10-CM | POA: Diagnosis not present

## 2017-11-26 LAB — TROPONIN I

## 2017-11-26 LAB — GLUCOSE, CAPILLARY
GLUCOSE-CAPILLARY: 138 mg/dL — AB (ref 65–99)
GLUCOSE-CAPILLARY: 129 mg/dL — AB (ref 65–99)

## 2017-11-26 LAB — HEMOGLOBIN A1C
HEMOGLOBIN A1C: 6.7 % — AB (ref 4.8–5.6)
Mean Plasma Glucose: 145.59 mg/dL

## 2017-11-26 LAB — LIPID PANEL
Cholesterol: 85 mg/dL (ref 0–200)
HDL: 26 mg/dL — ABNORMAL LOW (ref >40)
LDL CALC: 41 mg/dL (ref 0–99)
Total CHOL/HDL Ratio: 3.3 RATIO
Triglycerides: 91 mg/dL (ref <150)
VLDL: 18 mg/dL (ref 0–40)

## 2017-11-26 MED ORDER — INSULIN ASPART 100 UNIT/ML ~~LOC~~ SOLN
0.0000 [IU] | Freq: Three times a day (TID) | SUBCUTANEOUS | Status: DC
  Administered 2017-11-26 (×2): 1 [IU] via SUBCUTANEOUS

## 2017-11-26 MED ORDER — GUAIFENESIN 100 MG/5ML PO SOLN
10.0000 mL | Freq: Three times a day (TID) | ORAL | Status: DC | PRN
  Administered 2017-11-26: 200 mg via ORAL

## 2017-11-26 NOTE — Telephone Encounter (Signed)
[name] wants to talk to Dr Yisroel Ramming about putting her in therapy for her foot.  Pt is currently a pt in the hospital.

## 2017-11-26 NOTE — Plan of Care (Signed)
  Progressing Safety: Ability to remain free from injury will improve 11/26/2017 0322 - Progressing by Ardine Eng, RN

## 2017-11-26 NOTE — Consult Note (Signed)
   [facility] CM Inpatient Consult   11/26/2017  [name] 1924/06/14 [id number]   Patient was assessed for [place] [facility] for community services. Patient was previously active with [place] [facility].  Met with patient at bedside regarding being restarted with [facility] services. Patient states she is mainily chair bound and does not walk.  She states she is able to get to the bedside commode.  She lives with [name] but [name] does not have a car.  She states she gets to her MD appointments by Hassan Rowan her daughter-in-law.  Patient states she uses [facility] [address] [employer] [address].  She sates she feels that her needs are met.  She did accept a brochure with 24 hour nurse line magnet and encouraged to call for needs.  She states she would and verbalized understanding.  Will assign to General EMMI calls. She endorses [name], DO as her primary care provider at [place].  Of note, [facility] Care Management services does not replace or interfere with any services that are arranged by inpatient case management or social work. For additional questions or referrals please contact:  Natividad Brood, RN BSN [facility] Liaison  [phone] business mobile phone Toll free office [phone]

## 2017-11-26 NOTE — Discharge Summary (Signed)
[facility] Discharge Summary  [name] Medical record number: [id number] Date of birth: 19-Nov-1923 Age: 84 y.o. Gender: female Date of Admission: 11/25/2017  Date of Discharge: 11/26/2017 Admitting Physician: Zenia Resides, MD  Primary Care Provider: [place] Bing, DO Consultants: none  Indication for Hospitalization: chest pain rule out  Discharge Diagnoses/Problem List:  Chest pain Cough HF w/ mixed perserved and reduced EF HTN Type II diabetes   Disposition: home  Discharge Condition: stable  Discharge Exam: General:alert, oriented x3. No acute distress Eyes:eomi, perrl Cardiovascular:rrr, palpable radial pulse bilaterally, palpable dp/pt bilateral LE Respiratory:lungs clear to ausculation bilaterally, no tender to palpation in ribs in anterior, lower thoracic distribution Gastrointestinal:soft, non-tender, non-distended. MSK:5/5 strength all muscle distributions BUE, BLE Derm:warm, dry. Dead skin noted between multiple toes BLE Neuro: 5/5 strength all muscle groups BUE, BLE, no focal neuro deficits Psych:appropriate, pleasant    Brief Hospital Course:  84 year old who presented on 1/13 for chest pain. She states that her chest pain was located in a single point in her left lower thorax. It did not radiate. By the time she was evaluated it was non-reproducible. BNP only 101 which is below her baseline. ekg unchanged from baseline. Troponin negative x2. It was presumed to be musculoskeletal in etiology. She was admitted for overnight observation. As patient was no longer having chest pain and her workup had been negative she was deemed stable for discharge on 1/14.  Of note she did have an incidental finding of >100k CFU on admission UA. She had displayed no symptoms of a UTI and no complaints. Felt to be asymptomatic bacturia. No treatment indicated.  Issues for Follow Up:  1. Follow up chest pain 2. Follow  up with podiatrist for foot pain  Significant Procedures:   Significant Labs and Imaging:  Recent Labs  Lab 11/25/17 1929  WBC 5.0  HGB 10.2*  HCT 33.3*  PLT 196   Recent Labs  Lab 11/25/17 1929  NA 138  K 3.1*  CL 104  CO2 24  GLUCOSE 96  BUN 10  CREATININE 0.84  CALCIUM 10.9*    Results/Tests Pending at Time of Discharge:  Discharge Medications:  Allergies as of 11/26/2017      Reactions   Tape Other (See Comments)   Skin is very thin and will tear and bruise easily!!   Lisinopril Cough      Medication List    STOP taking these medications   clopidogrel 75 MG tablet Commonly known as:  PLAVIX     TAKE these medications   acetaminophen 500 MG tablet Commonly known as:  TYLENOL Take 500 mg by mouth every 6 (six) hours as needed for moderate pain.   albuterol 108 (90 Base) MCG/ACT inhaler Commonly known as:  PROVENTIL HFA;VENTOLIN HFA Inhale 2 puffs into the lungs every 6 (six) hours as needed. For shortness of breath What changed:    reasons to take this  additional instructions   amLODipine 5 MG tablet Commonly known as:  NORVASC Take 1 tablet (5 mg total) by mouth daily.   ASPIRIN CHILDRENS 81 MG chewable tablet Generic drug:  aspirin Chew 81 mg by mouth daily.   capsaicin 0.025 % cream Commonly known as:  ZOSTRIX APPLY TO AFFECTED AREA TWICE A DAY   carvedilol 12.5 MG tablet Commonly known as:  COREG TAKE 1 TABLET BY MOUTH TWICE A DAY WITH A MEAL What changed:  See the new instructions.   feeding  supplement (PRO-STAT SUGAR FREE 64) Liqd Take 30 mLs by mouth 2 (two) times daily with a meal. ensure   ferrous sulfate 325 (65 FE) MG tablet Take 1 tablet (325 mg total) by mouth daily with breakfast.   guaifenesin 100 MG/5ML syrup Commonly known as:  ROBITUSSIN Take 10 mLs (200 mg total) by mouth 3 (three) times daily as needed for cough.   hydroxypropyl methylcellulose / hypromellose 2.5 % ophthalmic solution Commonly known as:  ISOPTO  TEARS / GONIOVISC Place 1 drop 3 (three) times daily into both eyes.   loratadine 10 MG tablet Commonly known as:  CLARITIN TAKE 1/2 TABLET BY MOUTH DAILY What changed:    how much to take  how to take this  when to take this   losartan 50 MG tablet Commonly known as:  COZAAR TAKE 1 TABLET (50 MG TOTAL) BY MOUTH AT BEDTIME.   metFORMIN 500 MG tablet Commonly known as:  GLUCOPHAGE Take 1 tablet (500 mg total) by mouth daily with breakfast.   PATADAY 0.2 % Soln Generic drug:  Olopatadine HCl APPLY 1 DROP TO EYE DAILY. What changed:  See the new instructions.   petrolatum-hydrophilic-aloe vera ointment Apply topically 2 (two) times daily as needed for wound care.   ranitidine 150 MG tablet Commonly known as:  ZANTAC TAKE 1 TABLET BY MOUTH TWICE A DAY What changed:    how much to take  how to take this  when to take this   rosuvastatin 20 MG tablet Commonly known as:  CRESTOR TAKE 1 TABLET (20 MG TOTAL) BY MOUTH AT BEDTIME.   senna-docusate 8.6-50 MG tablet Commonly known as:  Senokot-S Take 2 tablets by mouth at bedtime. What changed:    how much to take  when to take this   silver sulfADIAZINE 1 % cream Commonly known as:  SILVADENE Apply 1 application topically daily. What changed:    when to take this  reasons to take this   traMADol 50 MG tablet Commonly known as:  ULTRAM Take 1 tablet (50 mg total) by mouth every 8 (eight) hours as needed for severe pain.       Discharge Instructions: Please refer to Patient Instructions section of EMR for full details.  Patient was counseled important signs and symptoms that should prompt return to medical care, changes in medications, dietary instructions, activity restrictions, and follow up appointments.   Follow-Up Appointments: Follow-up Information    [place]. Go on 12/03/2017.   Why:  Go to appt at 1:45 PM. Please arrive 15 mins early and bring medications. Contact  information: [place]          Guadalupe Dawn, MD 11/29/2017, 9:18 AM PGY-1, [place]

## 2017-11-26 NOTE — Progress Notes (Signed)
FPTS Interim Progress Note  Discussed with Ms. Papin regarding hospitalization for chest pain.  Does not seem to be cardiac based on evaluation overnight.  She states her left-sided chest pain has resolved.  This seems pretty consistent with her previous left-sided chest pain near the site of her incision for lumpectomy many years ago.  I discussed with her the need for admission to make sure this was not related to her heart given her shortness of breath and worsening pain.  Patient understood and appreciates the care she has received.  She is interested in going home today.  Plan to discharge later based on PT/OT recs.  Will follow up with her outpatient.  Appreciate the great care provided by a FPTS.  [place] Bing, DO 11/26/2017, 2:02 PM PGY-2, [place] [facility] Service pager: [phone]

## 2017-11-26 NOTE — Discharge Instructions (Signed)
You were admitted for having worsening left-sided chest pain.  We admitted you for observation overnight to make sure this was not related to your heart.  Your evaluation did not show anything to support this.  We went ahead and schedule your follow-up appointment at our clinic.

## 2017-11-26 NOTE — Progress Notes (Signed)
Family Medicine Teaching Service Daily Progress Note Intern Pager: [phone]  [name] Medical record number: [id number] Date of birth: August 31, 1924 Age: 84 y.o. Gender: female  Primary Care Provider: [place] Bing, DO Consultants: none Code Status: DNR/DNI  Pt Overview and Major Events to Date:  1/13 admitted to FPTS  Assessment and Plan: Chest Pain Likely musculoskeletal chest pain which has not been present since admission. AM ekg pending. Lipid Panel normal. Other causes of chest pain still very unlikely. troponins still negative x2. Will draw one more. Can likely DC later today. Will have PT/OT evaluate given advanced age. - vital signs per floor routine - cardiac monitoring - O2 supplementation as needed to keeps sats >92% - aspirin 81mg  - trend troponins q 6 hours - tylenol as needed for pain - home tramadol as needed for severe pain - lovenox 40mg  daily for dvt ppx - EKG in am - prn zofran - crestor 20mg  daily  Cough/URI Leading differential still GERD vs URI. Given timing of cough (IE not related to sleep or eating) would still consider URI to be most likely. Continue to monitor, will add tessilon perles as needed. - supplemental O2 as needed - robitussin 200mg  tid prn - consider adding tessilon perles - Claritin 5mg  daily  Anemia Hemoglobin of 10.2. Appears to actually be higher than baseline of 9.5 Will continue to monitor. Given MCV of 71 likely to be either IDA or Anemia of chronic disease. Last ferritin 421 in 2015. Will consider repeat iron panel while inpatient. - continue ferrous sulfate - consider iron panel while inpatient  HF with mixed preserved and reduced QM:GQQP echo 2016 with EF 50-55% and G1DD. Clinically has 1+ pitting edema on exam. BNP lower than in 2017 at last hospitalization. Unlikely to need echo during this admission.  Hypertension BP 152/54 this am. Continue home meds. - continue coreg 12.5mg  - continue amlodipine  5mg  - losartan 50mg  daily  Type II diabetes Last A1C at 7.7 in 07/2017. Takes metformin as outpatient. Will place on sSSI while inpatient. Will draw A1C. - sSSI, AC/HS BS checks - recheck A1C  FEN/GI: carb-modified diet Prophylaxis: lovenox  Disposition: likely home vs snf  Subjective:  Doing well this morning, slept well overnight. Taking in good PO. No chest pain  Objective: Temp:  [97.9 F (36.6 C)-98.7 F (37.1 C)] 98.7 F (37.1 C) (01/14 0359) Pulse Rate:  [62-76] 67 (01/14 0359) Resp:  [8-18] 18 (01/14 0359) BP: (98-166)/(52-94) 152/54 (01/14 0359) SpO2:  [95 %-100 %] 97 % (01/14 0359) Weight:  [111 lb 15.9 oz (50.8 kg)-112 lb 14 oz (51.2 kg)] 112 lb 14 oz (51.2 kg) (01/14 0359) Physical Exam: General: alert, oriented x3. No acute distress Eyes: eomi, perrl Cardiovascular: rrr, palpable radial pulse bilaterally, palpable dp/pt bilateral LE Respiratory: lungs clear to ausculation bilaterally, no tender to palpation in ribs in anterior, lower thoracic distribution Gastrointestinal: soft, non-tender, non-distended. MSK: 5/5 strength all muscle distributions BUE, BLE Derm: warm, dry. Dead skin noted between multiple toes BLE Neuro: 5/5 strength all muscle groups BUE, BLE, no focal neuro deficits Psych: appropriate, pleasant  Laboratory: Recent Labs  Lab 11/19/17 1908 11/19/17 1924 11/25/17 1929  WBC 6.3  --  5.0  HGB 9.8* 11.6* 10.2*  HCT 31.7* 34.0* 33.3*  PLT 186  --  196   Recent Labs  Lab 11/19/17 1908 11/19/17 1924 11/25/17 1929  NA 137 139 138  K 3.4* 3.4* 3.1*  CL 104 104 104  CO2 24  --  24  BUN 16 16 10   CREATININE 1.15* 1.00 0.84  CALCIUM 10.8*  --  10.9*  PROT 7.2  --   --   BILITOT 0.5  --   --   ALKPHOS 49  --   --   ALT 7*  --   --   AST 18  --   --   GLUCOSE 165* 169* 96    Imaging/Diagnostic Tests: CLINICAL DATA:  Generalized chest pain for 1 day. Cough. Upper mid chest and rib pain for the past 2 weeks.  EXAM: CHEST  2  VIEW  COMPARISON:  02/03/2017 and chest CTA dated 08/30/2016.  FINDINGS: Normal sized heart. Interval minimal linear density at both lung bases. Stable small calcific densities overlying both apical regions. Thoracic and upper lumbar spine degenerative changes. Coronary artery stent.  IMPRESSION: Interval minimal bibasilar linear atelectasis or scarring.   Electronically Signed   By: Claudie Revering M.D.   On: 11/25/2017 18:41  Guadalupe Dawn, MD 11/26/2017, 6:04 AM PGY-1, [place] Intern pager: [phone], text pages welcome

## 2017-11-26 NOTE — Evaluation (Signed)
Occupational Therapy Evaluation Patient Details Name: [name] MRN: [id number] DOB: 04-05-1924 Today's Date: 11/26/2017    History of Present Illness Pt with h/o anemia, asthma, Breast cancer, CAD, Systolic heart failure, B1DV, depression, HLD, colon cancer, hypercalcemia, HTN, Neuropathy, osteoarthritis, PAD, PE, syncope. She presents with a 2 day history of cough and chest pain.    Clinical Impression   Pt was admitted as above currently demonstrating deficits in her ability to perform ADL and functional transfers related to ADL's ( see OT problem list below). She appears close to baseline level however no family is present to confirm PLOF. Will follow acutely for OT to assist in maximizing activity tolerance for ADL's.     Follow Up Recommendations  Home health OT;Supervision/Assistance - 24 hour    Equipment Recommendations  Other (comment)(TBD)    Recommendations for Other Services       Precautions / Restrictions Precautions Precautions: Fall Restrictions Weight Bearing Restrictions: No      Mobility Bed Mobility Overal bed mobility: (Pt up in chair upon OT arrival)                Transfers Overall transfer level: Needs assistance Equipment used: (Pt states that she does not use RW at all any more. Always has assist from home health aid or daughter/family PRN for SPT only, no ambulation per her report. No family present) Transfers: Sit to/from [facility] Sit to Stand: Max assist;From elevated surface Stand pivot transfers: Max assist;From elevated surface            Balance Overall balance assessment: Needs assistance Sitting-balance support: No upper extremity supported;Feet supported Sitting balance-Leahy Scale: Fair       Standing balance-Leahy Scale: Poor Standing balance comment: Pt able to sit edge of chair and stand with mod-max assist however has fear of falling and weakness in LE's, stating that she does not ambulate at  home and that family/aid assist w/ SPT from bed to chair or 3:1 only at home. Unable to static stand greater than 20-30 seconds during OT assessment.                           ADL either performed or assessed with clinical judgement   ADL Overall ADL's : Needs assistance/impaired;At baseline Eating/Feeding: Set up;Sitting   Grooming: Set up;Sitting   Upper Body Bathing: Minimal assistance;Sitting Upper Body Bathing Details (indicate cue type and reason): Pt has Home health aide 5x/week and family PRN assist Lower Body Bathing: Bed level;With caregiver independent assisting;Maximal assistance Lower Body Bathing Details (indicate cue type and reason): Pt has home health aid for bathing and dressing and family PRN assist Upper Body Dressing : Minimal assistance;Sitting   Lower Body Dressing: Bed level;Maximal assistance;With caregiver independent assisting Lower Body Dressing Details (indicate cue type and reason): Pt has home health aid for bathing and dressing and family PRN assist Toilet Transfer: BSC;Stand-pivot;Maximal assistance Toilet Transfer Details (indicate cue type and reason): Pt reports that her family and home health aid assist PRN for SPT from chair or bed to 3:1. She states that she does not ambulate or use RW any longer. No family present to corroborate  Toileting- Water quality scientist and Hygiene: Maximal assistance;Sitting/lateral lean;Sit to/from Nurse, children's Details (indicate cue type and reason): Pt reports that she takes sponge baths with assist from home helath aid and family/daughter PRN. Functional mobility during ADLs: (Pt reports that she does not use  RW, only SPT with family/aid assist for bed, chair and 3:1) General ADL Comments: Pt was seen for OT assessment today. No family present and pt confused at times. Overall states that she does not ambulate at home, states that she does not use her RW, only transfers from bed to chair or 3:1  with assist from daughter or home health aid that she has 5days per week. Overall Max Assist for SPT given increased time. Pt appears close to baseline level at this time and plans to d/c home later today if cleared by MD.  Will benefit from acute OT if she doesnt go home today to assist in maximizing independence with functional tranfers.     Vision Baseline Vision/History: Wears glasses Wears Glasses: At all times Patient Visual Report: No change from baseline       Perception     Praxis      Pertinent Vitals/Pain Pain Assessment: Faces Pain Score: 0-No pain Faces Pain Scale: No hurt     Hand Dominance Right   Extremity/Trunk Assessment Upper Extremity Assessment Upper Extremity Assessment: Overall WFL for tasks assessed;Generalized weakness(End range shoulder flexion limitations but able to reach above and behind her head)   Lower Extremity Assessment Lower Extremity Assessment: Defer to PT evaluation       Communication Communication Communication: No difficulties   Cognition Arousal/Alertness: Awake/alert Behavior During Therapy: WFL for tasks assessed/performed Overall Cognitive Status: No family/caregiver present to determine baseline cognitive functioning                                     General Comments  No family present during assessment to corroborate pt reports. Pt has 7 STE house but states that she does not ambulate.    Exercises     Shoulder Instructions      Home Living Family/patient expects to be discharged to:: Private residence Living Arrangements: Children Available Help at Discharge: Family;Available 24 hours/day(States daughter provides 24/7 care) Type of Home: House Home Access: Stairs to enter Entrance Stairs-Number of Steps: 7 Entrance Stairs-Rails: Right(On right going into house) Home Layout: One level     Bathroom Shower/Tub: Teacher, early years/pre: Standard     Home Equipment: Environmental consultant - 2  wheels;Bedside commode   Additional Comments: Pt reports that an aide 5x/week and A with bathing and dressing. Pt reports that she was not very active PTA.  I don't walk Also states that she sleeps in recliner and has 3:1 beside her chair.      Prior Functioning/Environment Level of Independence: Needs assistance  Gait / Transfers Assistance Needed: Pt states Did not walk much at home. Sits/sleeps in recliner has 3:1 beside chair although has a RW. ADL's / Homemaking Assistance Needed: Pt has aide come each morning for ADLs. Communication / Swallowing Assistance Needed: Pt did feed herself. Comments: Pt states her daughter will help her all the time.         OT Problem List: Decreased activity tolerance;Decreased strength;Decreased knowledge of use of DME or AE;Pain;Impaired balance (sitting and/or standing)      OT Treatment/Interventions: Self-care/ADL training;DME and/or AE instruction;Therapeutic activities;Patient/family education    OT Goals(Current goals can be found in the care plan section) Acute Rehab OT Goals Patient Stated Goal: Go home Time For Goal Achievement: 12/10/17 Potential to Achieve Goals: Fair  OT Frequency: Min 2X/week   Barriers to D/C:  Co-evaluation              AM-PAC PT "6 Clicks" Daily Activity     Outcome Measure Help from another person eating meals?: None Help from another person taking care of personal grooming?: A Little Help from another person toileting, which includes using toliet, bedpan, or urinal?: A Lot Help from another person bathing (including washing, rinsing, drying)?: A Lot Help from another person to put on and taking off regular upper body clothing?: A Little Help from another person to put on and taking off regular lower body clothing?: A Lot 6 Click Score: 16   End of Session Equipment Utilized During Treatment: Gait belt  Activity Tolerance: Patient tolerated treatment well Patient left: in chair;with  call bell/phone within reach;with chair alarm set;with nursing/sitter in room  OT Visit Diagnosis: Unsteadiness on feet (R26.81);Muscle weakness (generalized) (M62.81)                Time: 9937-1696 OT Time Calculation (min): 20 min Charges:  OT General Charges $OT Visit: 1 Visit OT Evaluation $OT Eval Moderate Complexity: 1 Mod OT Treatments $Self Care/Home Management : 8-22 mins G-Codes:      [place], Amy Beth [name], OTR/L 11/26/2017, 11:23 AM

## 2017-11-26 NOTE — Evaluation (Signed)
Physical Therapy Evaluation Patient Details Name: [name] MRN: [id number] DOB: September 05, 1924 Today's Date: 11/26/2017   History of Present Illness  Pt is 84 yo female with PMH: anemia, asthma, Breast cancer, CAD, Systolic heart failure, C6CB, depression, HLD, colon cancer, hypercalcemia, HTN, Neuropathy, osteoarthritis, PAD, PE, syncope. She presents with a 2 day history of cough and chest pain.   Clinical Impression  Pt admitted with above diagnosis. Pt currently with functional limitations due to the deficits listed below (see PT Problem List). Pt transferred chair to bed and back with mod A to stand and min A to step. Pt with mid thoracic pain that limits her mobility as well as dizziness. She would like to be ambulatory again and feel that she could be from a strength standpoint but will need some HHPT. Placed hot pack on L mid back after session with some relief.  Pt will benefit from skilled PT to increase their independence and safety with mobility to allow discharge to the venue listed below.       Follow Up Recommendations Home health PT    Equipment Recommendations  None recommended by PT    Recommendations for Other Services       Precautions / Restrictions Precautions Precautions: Fall Restrictions Weight Bearing Restrictions: No      Mobility  Bed Mobility Overal bed mobility: (Pt up in chair upon OT arrival)             General bed mobility comments: pt in chair  Transfers Overall transfer level: Needs assistance Equipment used: 1 person hand held assist Transfers: Sit to/from Stand;Stand Pivot Transfers Sit to Stand: Mod assist Stand pivot transfers: Min assist       General transfer comment: practiced standing as well as transferring to EOB and back to recliner. Pt's back pain increases when she stands with erect posture. Mod A for power up but once up, pt can slowly step feet to bed. Fearful of falling.   Ambulation/Gait             General  Gait Details: unable today but pt wants to work towards this  Financial trader Rank[place] (Stroke Patients Only)       Balance Overall balance assessment: Needs assistance Sitting-balance support: No upper extremity supported;Feet supported Sitting balance-Leahy Scale: Fair     Standing balance support: Single extremity supported Standing balance-Leahy Scale: Poor Standing balance comment: unable to stand without support                             Pertinent Vitals/Pain Pain Assessment: Faces Pain Score: 0-No pain Faces Pain Scale: Hurts even more Pain Location: mid back Pain Descriptors / Indicators: Aching Pain Intervention(s): Limited activity within patient's tolerance;Monitored during session;Heat applied    Home Living Family/patient expects to be discharged to:: Private residence Living Arrangements: Children Available Help at Discharge: Family;Available 24 hours/day;Personal care attendant Type of Home: House Home Access: Stairs to enter Entrance Stairs-Rails: Right Entrance Stairs-Number of Steps: 7 Home Layout: One level Home Equipment: Walker - 2 wheels;Bedside commode Additional Comments: pt reports that she only transfers from bed to recliner and BSC and wants to walk but her back pain and dizziness have been limiting her    Prior Function Level of Independence: Needs assistance   Gait / Transfers Assistance Needed: was only transferring but wants to be  able to ambulate short distances  ADL's / Homemaking Assistance Needed: Pt has aide come each morning for ADLs.  Comments: Pt states her daughter will help her all the time.      Hand Dominance   Dominant Hand: Right    Extremity/Trunk Assessment   Upper Extremity Assessment Upper Extremity Assessment: Defer to OT evaluation    Lower Extremity Assessment Lower Extremity Assessment: Overall WFL for tasks assessed    Cervical / Trunk  Assessment Cervical / Trunk Assessment: Kyphotic  Communication   Communication: No difficulties  Cognition Arousal/Alertness: Awake/alert Behavior During Therapy: WFL for tasks assessed/performed Overall Cognitive Status: No family/caregiver present to determine baseline cognitive functioning                                        General Comments General comments (skin integrity, edema, etc.): Gave pt seated thoracic extension exercises including bilateral shoulder flexion and bilateral shoulder abduction with erect posture. Pt could tolerate this in sitting but not standing. Gave her a hot pack to L thoracic spine after session.     Exercises     Assessment/Plan    PT Assessment Patient needs continued PT services  PT Problem List Decreased activity tolerance;Decreased balance;Decreased mobility;Decreased knowledge of precautions;Pain       PT Treatment Interventions DME instruction;Gait training;Stair training;Functional mobility training;Therapeutic activities;Therapeutic exercise;Balance training;Patient/family education    PT Goals (Current goals can be found in the Care Plan section)  Acute Rehab PT Goals Patient Stated Goal: Go home PT Goal Formulation: With patient Time For Goal Achievement: 12/10/17 Potential to Achieve Goals: Good    Frequency Min 3X/week   Barriers to discharge Inaccessible home environment 7 steps to enter home    Co-evaluation               AM-PAC PT "6 Clicks" Daily Activity  Outcome Measure Difficulty turning over in bed (including adjusting bedclothes, sheets and blankets)?: A Little Difficulty moving from lying on back to sitting on the side of the bed? : Unable Difficulty sitting down on and standing up from a chair with arms (e.g., wheelchair, bedside commode, etc,.)?: Unable Help needed moving to and from a bed to chair (including a wheelchair)?: A Lot Help needed walking in hospital room?: Total Help needed  climbing 3-5 steps with a railing? : Total 6 Click Score: 9    End of Session   Activity Tolerance: Patient tolerated treatment well;Patient limited by pain Patient left: in chair;with call bell/phone within reach;with nursing/sitter in room Nurse Communication: Mobility status PT Visit Diagnosis: Unsteadiness on feet (R26.81);Difficulty in walking, not elsewhere classified (R26.2);Pain Pain - Right/Left: Left Pain - part of body: (back)    Time: 6010-9323 PT Time Calculation (min) (ACUTE ONLY): 28 min   Charges:   PT Evaluation $PT Eval Moderate Complexity: 1 Mod PT Treatments $Therapeutic Activity: 8-22 mins   PT G Codes:        [place]  [place] 11/26/2017, 1:05 PM

## 2017-11-26 NOTE — Progress Notes (Signed)
Patient admitted to floor.  Patient stable no complaint of chest pain.  No signs discomfort.  CBG 100.  Patient requested snack had half [place] sandwhich and ensure, tolerated well. Pt slept during night no coughing noted

## 2017-11-27 ENCOUNTER — Telehealth: Payer: Self-pay | Admitting: Family Medicine

## 2017-11-27 NOTE — Telephone Encounter (Signed)
Received urine culture results from most recent admission with >100k colonies E Coli. Due to patient's age, stable vitals throughout admission, and asymptomatic, likely due to colonization. Will recommend to defer antibiotic treatment. Certainly, if patient becomes symptomatic, recommend treating with antibiotics.  Rory Percy, DO PGY-1, [facility] 11/27/2017 2:34 PM

## 2017-11-28 ENCOUNTER — Encounter: Payer: Self-pay | Admitting: Podiatry

## 2017-11-28 ENCOUNTER — Ambulatory Visit (INDEPENDENT_AMBULATORY_CARE_PROVIDER_SITE_OTHER): Payer: Medicare Other | Admitting: Podiatry

## 2017-11-28 DIAGNOSIS — M79674 Pain in right toe(s): Secondary | ICD-10-CM

## 2017-11-28 DIAGNOSIS — M79675 Pain in left toe(s): Secondary | ICD-10-CM | POA: Diagnosis not present

## 2017-11-28 DIAGNOSIS — E1142 Type 2 diabetes mellitus with diabetic polyneuropathy: Secondary | ICD-10-CM | POA: Diagnosis not present

## 2017-11-28 DIAGNOSIS — B351 Tinea unguium: Secondary | ICD-10-CM | POA: Diagnosis not present

## 2017-11-28 LAB — URINE CULTURE

## 2017-11-29 NOTE — Progress Notes (Signed)
Subjective:   Patient ID: [name], female   DOB: 84 y.o.   MRN: [id number]   HPI Patient presents with nail disease 1-5 both feet that are thick yellow and they cannot take care of themselves   ROS      Objective:  Physical Exam  Mycotic nail infection 1-5 both feet with thick yellow brittle nails that are painful when palpated     Assessment:  Painful thick nail disease 1-5 both feet     Plan:  Debride painful nailbeds 1-5 both feet with no iatrogenic bleeding noted

## 2017-12-03 ENCOUNTER — Ambulatory Visit (INDEPENDENT_AMBULATORY_CARE_PROVIDER_SITE_OTHER): Payer: Medicare Other | Admitting: Family Medicine

## 2017-12-03 ENCOUNTER — Other Ambulatory Visit: Payer: Self-pay

## 2017-12-03 ENCOUNTER — Encounter: Payer: Self-pay | Admitting: Family Medicine

## 2017-12-03 VITALS — BP 142/88 | HR 86 | Temp 98.3°F | Ht 59.0 in | Wt 115.0 lb

## 2017-12-03 DIAGNOSIS — M7989 Other specified soft tissue disorders: Secondary | ICD-10-CM | POA: Diagnosis not present

## 2017-12-03 DIAGNOSIS — M546 Pain in thoracic spine: Secondary | ICD-10-CM

## 2017-12-03 DIAGNOSIS — R079 Chest pain, unspecified: Secondary | ICD-10-CM | POA: Diagnosis present

## 2017-12-03 MED ORDER — TRAMADOL HCL 50 MG PO TABS: 50.0000 mg | ORAL_TABLET | Freq: Three times a day (TID) | ORAL | 0 refills | Status: DC | PRN

## 2017-12-03 NOTE — Patient Instructions (Signed)
You were seen today for a hospital follow up. I am glad that you have been doing very well since we last saw you aside from a small cold. I am glad that that has resolved. We discussed your back and knee pain. I believe these are likely chronic ailments and recommend tylenol 325mg  up to 4 times per day. I have also refilled your tramadol script. Regarding your hand swelling, I believe this is likely from an IV that you have placed while you were admitted. It should go away in 3-4 days. Please let us know if you have any medical issues that arise. Have a safe trip home and a great day.

## 2017-12-10 DIAGNOSIS — M7989 Other specified soft tissue disorders: Secondary | ICD-10-CM | POA: Insufficient documentation

## 2017-12-10 NOTE — Progress Notes (Signed)
   HPI 84 year old who presents as a hospital follow up for a recent admission on 1/13 for chest pain. She was admitted for observation but cardiac workup was negative. It was felt that her chest pain was most likely musculoskeletal. She reports having no chest pain since that time and has been feeling very well. Her only complaints are that she is having some chronic back and knee pain. These are both unchanged and have been present for several years. In the intermit she reports that she did have some symptoms of an upper respiratory infection but this has been resolved for a couple of days now.  Patient does report some swelling in her left hand. This has improved in recent days. Of note she did have an IV in this hand for two days during his hospitalization.   CC: hospital follow up   ROS: Review of Systems  Constitutional: Negative for chills and fever.  HENT: Positive for congestion. Negative for sore throat.   Respiratory: Positive for cough and sputum production. Negative for hemoptysis, shortness of breath and wheezing.   Cardiovascular: Negative for chest pain and palpitations.  Gastrointestinal: Negative for abdominal pain, constipation, diarrhea, nausea and vomiting.  Musculoskeletal: Positive for back pain and joint pain.    Review of Systems See HPI for ROS.   CC, SH/smoking status, and VS noted  Objective: BP (!) 142/88   Pulse 86   Temp 98.3 F (36.8 C) (Oral)   Ht 4\' 11"  (1.499 m)   Wt 115 lb (52.2 kg)   SpO2 98%   BMI 23.23 kg/m  Gen: NAD, alert, cooperative, and pleasant.[name], resting comfortably in wheelchair HEENT: NCAT, EOMI, PERRL CV: RRR, no murmur, palpable radial pulse Resp: end-expiratory wheezes appreciated in all lung fields, non-labored Abd: SNTND, BS present, no guarding or organomegaly Ext: No edema, warm. Bruising noted on top of left hand. Neuro: Alert and oriented, Speech clear, No gross deficits   Assessment and  plan:  Chest pain Resolved. Felt to be musculoskeletal during admission. She has had no chest pain since her admission. Counseled on alarm symptoms.  Swelling of left hand Likely related to having an IV in that hand for multiple days during her admission. Is improving. Could consider wrapping to bring resolution faster but likely no treatment necessary.  Thoracic back pain Stable problem. No alarm symptoms. Known thoracic scoliosis on xray in 2015. Will refill tramadol but no further workup indicated.   No orders of the defined types were placed in this encounter.   Meds ordered this encounter  Medications  . traMADol (ULTRAM) 50 MG tablet    Sig: Take 1 tablet (50 mg total) by mouth every 8 (eight) hours as needed for severe pain.    Dispense:  30 tablet    Refill:  0     [name] Dawn MD PGY-1 Family Medicine Resident 12/10/2017 8:22 AM

## 2017-12-10 NOTE — Assessment & Plan Note (Signed)
Likely related to having an IV in that hand for multiple days during her admission. Is improving. Could consider wrapping to bring resolution faster but likely no treatment necessary.

## 2017-12-10 NOTE — Assessment & Plan Note (Signed)
Resolved. Felt to be musculoskeletal during admission. She has had no chest pain since her admission. Counseled on alarm symptoms.

## 2017-12-10 NOTE — Assessment & Plan Note (Signed)
Stable problem. No alarm symptoms. Known thoracic scoliosis on xray in 2015. Will refill tramadol but no further workup indicated.

## 2017-12-19 ENCOUNTER — Other Ambulatory Visit: Payer: Self-pay | Admitting: *Deleted

## 2017-12-20 MED ORDER — LOSARTAN POTASSIUM 50 MG PO TABS: ORAL_TABLET | ORAL | 6 refills | Status: DC

## 2017-12-20 MED ORDER — AMLODIPINE BESYLATE 5 MG PO TABS: 5.0000 mg | ORAL_TABLET | Freq: Every day | ORAL | 6 refills | Status: DC

## 2017-12-21 ENCOUNTER — Other Ambulatory Visit: Payer: Self-pay

## 2017-12-21 MED ORDER — AMLODIPINE BESYLATE 5 MG PO TABS: 5.0000 mg | ORAL_TABLET | Freq: Every day | ORAL | 6 refills | Status: DC

## 2017-12-21 MED ORDER — LOSARTAN POTASSIUM 50 MG PO TABS: ORAL_TABLET | ORAL | 6 refills | Status: DC

## 2018-01-16 IMAGING — DX DG CHEST 2V
2 series · 2 of 2 positions shown · non-contrast
Comparison: 02/03/2017 and chest CTA dated 08/30/2016.

CLINICAL DATA: Generalized chest pain for 1 day. Cough. Upper mid
chest and rib pain for the past 2 weeks.

EXAM:
CHEST  2 VIEW

[x chest ap]
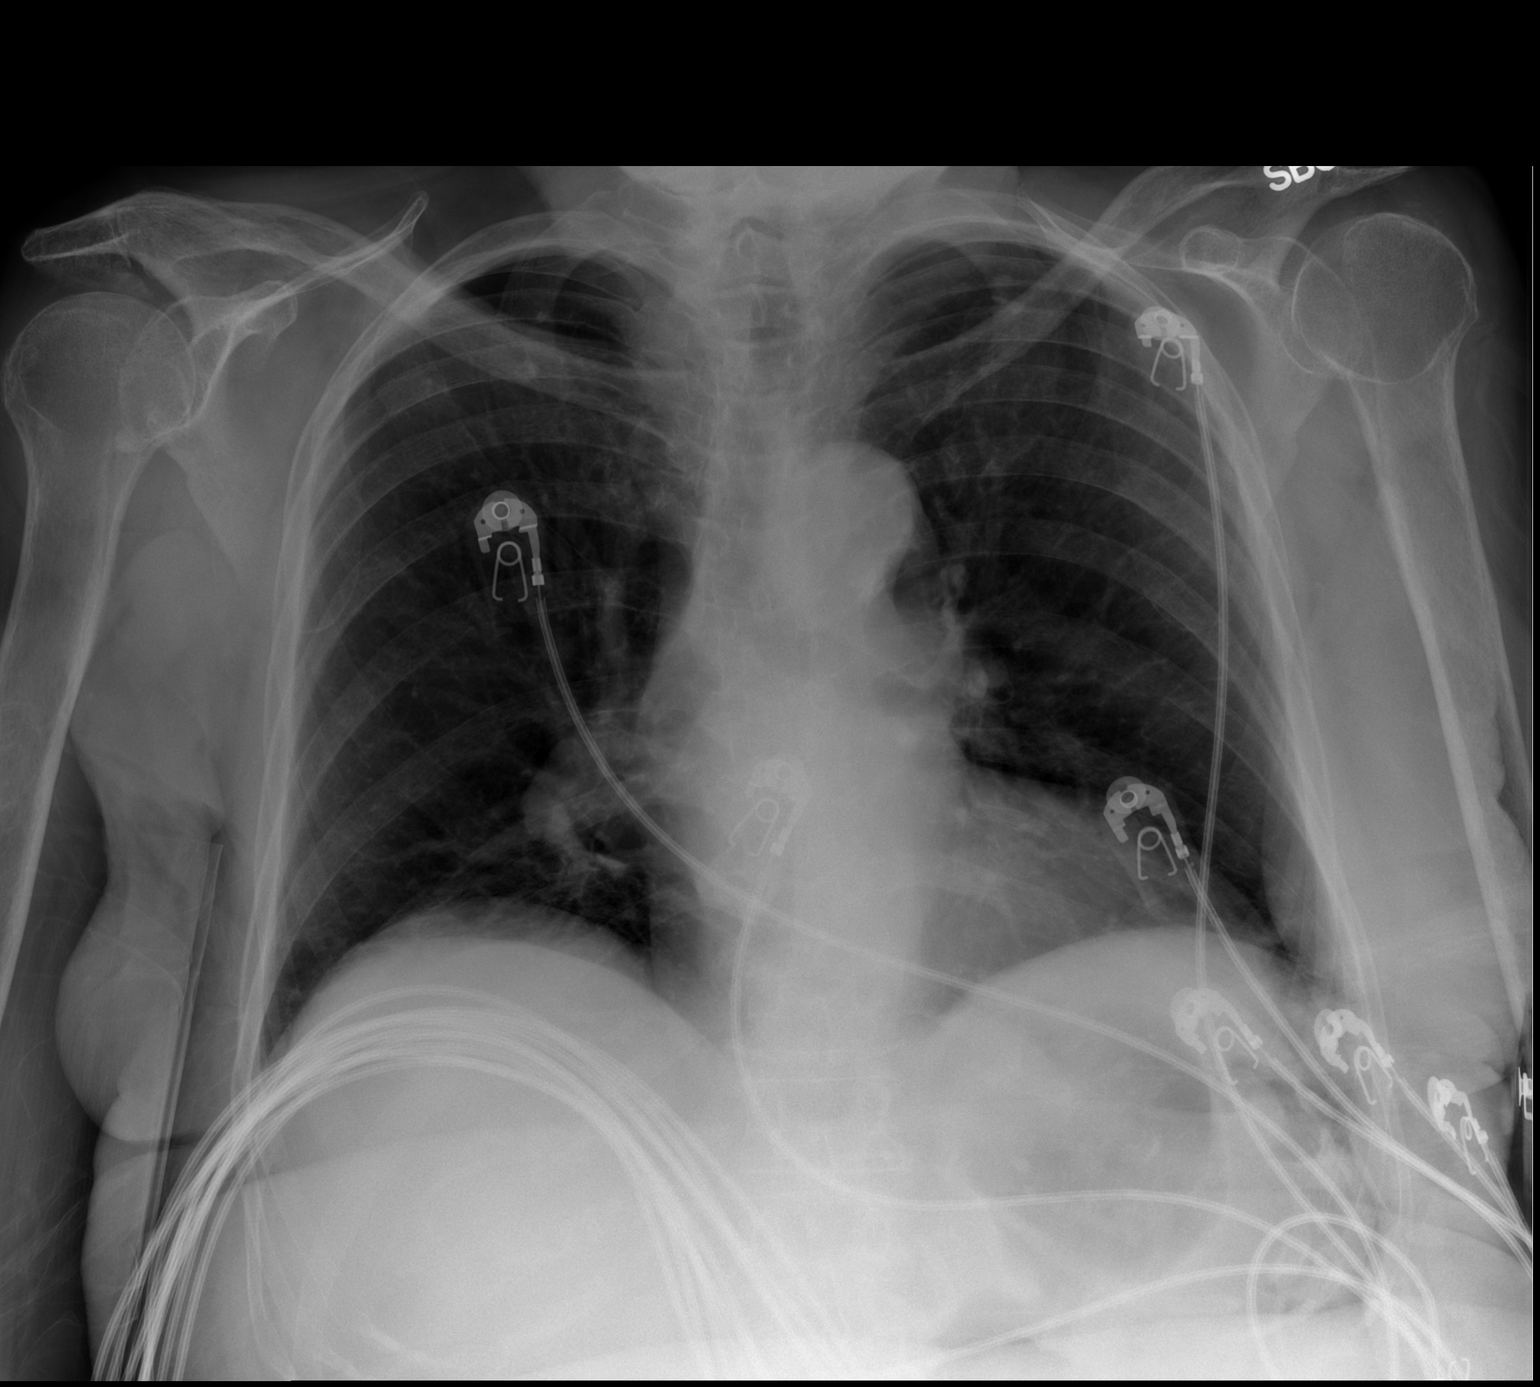

[w chest lat]
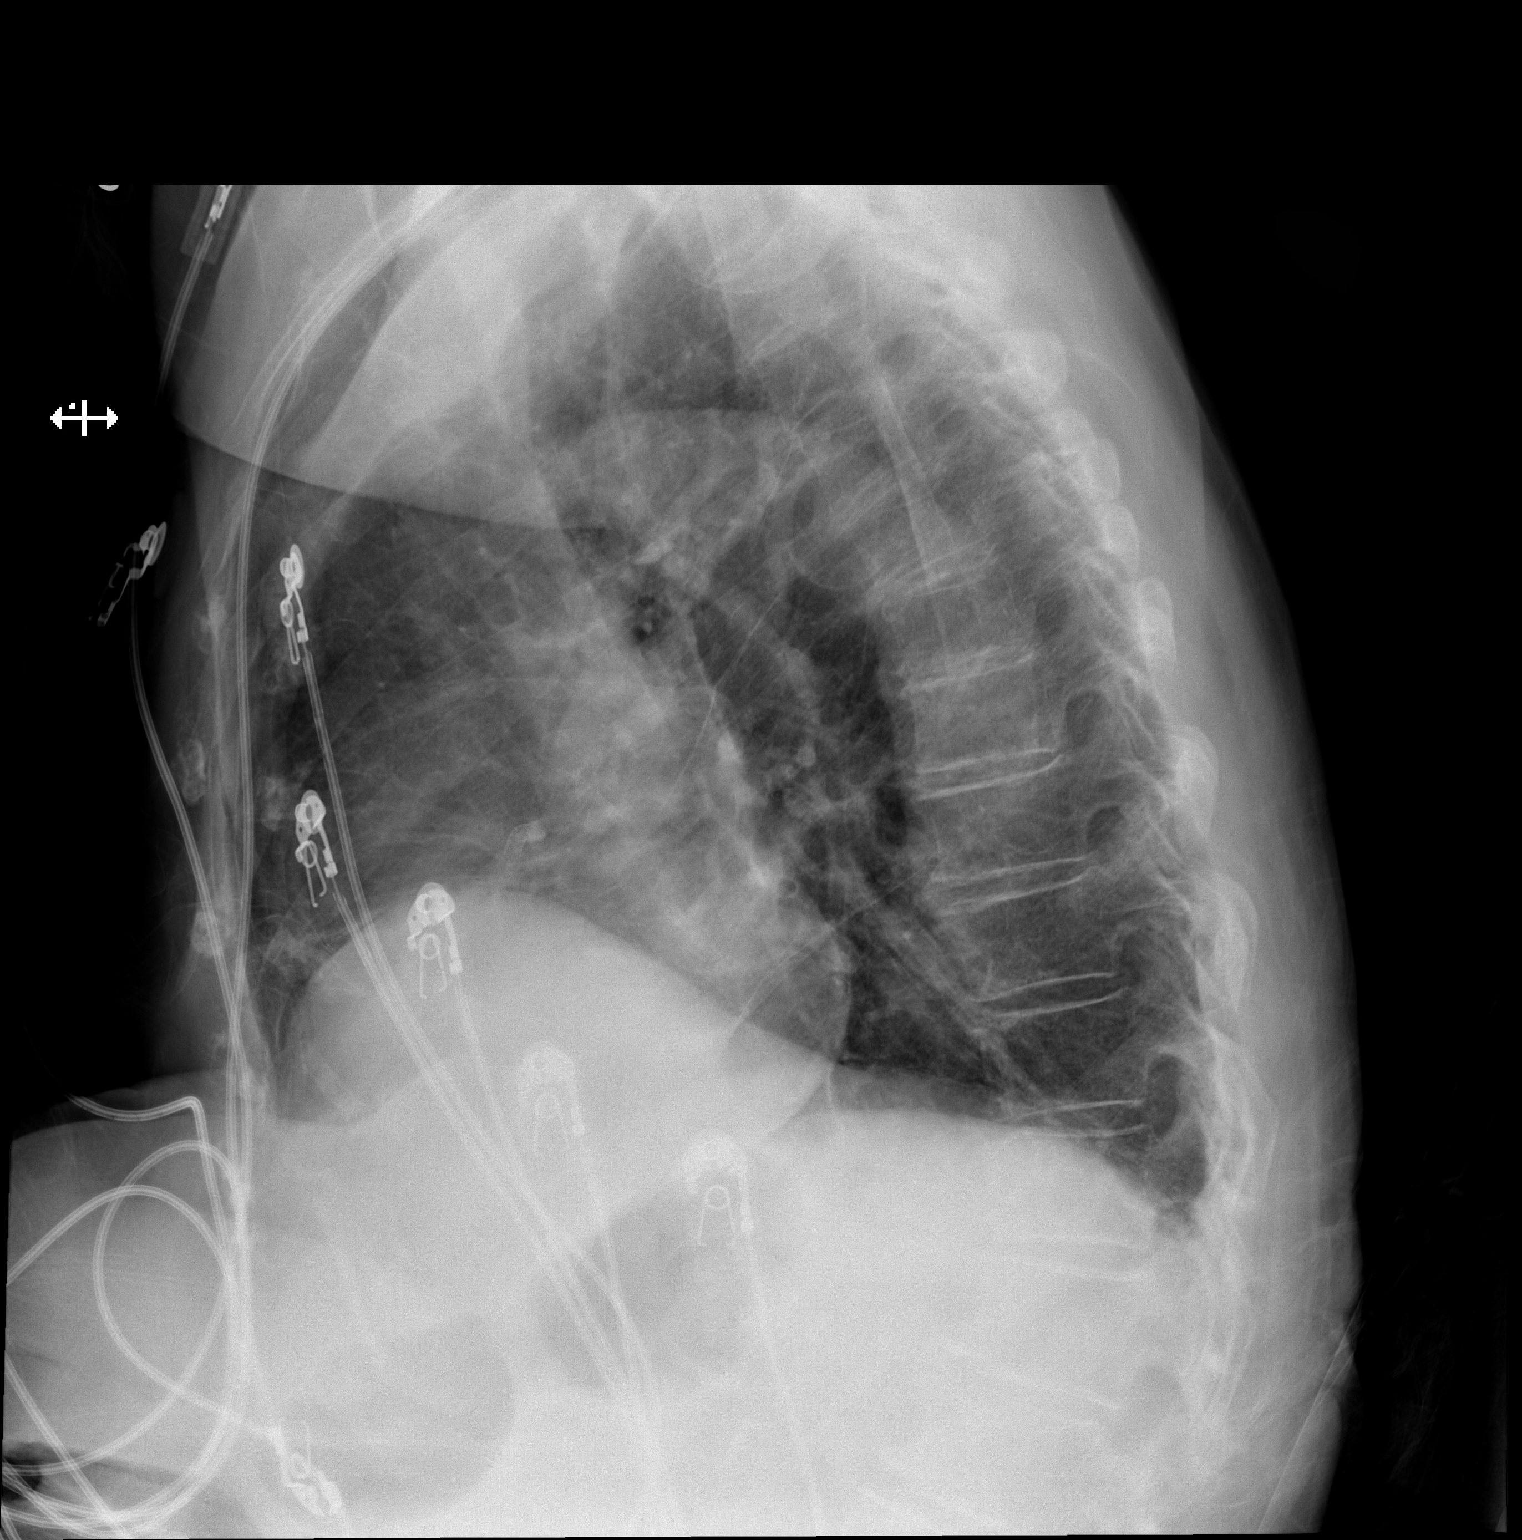

[2 of 2 positions shown; findings below may reference images not displayed]

FINDINGS: Normal sized heart. Interval minimal linear density at both lung
bases. Stable small calcific densities overlying both apical
regions. Thoracic and upper lumbar spine degenerative changes.
Coronary artery stent.
IMPRESSION: Interval minimal bibasilar linear atelectasis or scarring.

## 2018-01-18 ENCOUNTER — Other Ambulatory Visit: Payer: Self-pay | Admitting: Family Medicine

## 2018-02-08 ENCOUNTER — Telehealth: Payer: Self-pay | Admitting: Family Medicine

## 2018-02-08 ENCOUNTER — Telehealth: Payer: Self-pay | Admitting: Podiatry

## 2018-02-08 NOTE — Telephone Encounter (Signed)
Left message on [name]'s phone to call again concerning pt.

## 2018-02-08 NOTE — Telephone Encounter (Signed)
Left message to call on Monday or to contact appt line.

## 2018-02-08 NOTE — Telephone Encounter (Signed)
Prescription for Tramadol dated 08/15/17 was shredded on 02/08/18.

## 2018-02-08 NOTE — Telephone Encounter (Signed)
This is Tammie Best boy for Ms. Jerold Coombe. Can someone please call her back at [phone]. Can someone please call her back before this afternoon. She really needs to speak to a nurse bad. Okay, thank you.

## 2018-02-08 NOTE — Telephone Encounter (Signed)
This is [name] boy for Ms. [name]. Is there any way you can have a nurse call her back at [phone]. Thank you.

## 2018-02-18 ENCOUNTER — Emergency Department (HOSPITAL_COMMUNITY)
Admission: EM | Admit: 2018-02-18 | Discharge: 2018-02-18 | Disposition: A | Discharge status: Home / Self Care | Payer: Medicare Other | Attending: Emergency Medicine | Admitting: Emergency Medicine

## 2018-02-18 ENCOUNTER — Emergency Department (HOSPITAL_COMMUNITY): Payer: Medicare Other

## 2018-02-18 ENCOUNTER — Encounter (HOSPITAL_COMMUNITY): Payer: Self-pay | Admitting: *Deleted

## 2018-02-18 ENCOUNTER — Other Ambulatory Visit: Payer: Self-pay

## 2018-02-18 DIAGNOSIS — J189 Pneumonia, unspecified organism: Secondary | ICD-10-CM | POA: Diagnosis not present

## 2018-02-18 DIAGNOSIS — I2510 Atherosclerotic heart disease of native coronary artery without angina pectoris: Secondary | ICD-10-CM | POA: Insufficient documentation

## 2018-02-18 DIAGNOSIS — Z7982 Long term (current) use of aspirin: Secondary | ICD-10-CM | POA: Insufficient documentation

## 2018-02-18 DIAGNOSIS — I5020 Unspecified systolic (congestive) heart failure: Secondary | ICD-10-CM | POA: Diagnosis not present

## 2018-02-18 DIAGNOSIS — Z79899 Other long term (current) drug therapy: Secondary | ICD-10-CM | POA: Diagnosis not present

## 2018-02-18 DIAGNOSIS — I110 Hypertensive heart disease with heart failure: Secondary | ICD-10-CM | POA: Insufficient documentation

## 2018-02-18 DIAGNOSIS — Z96651 Presence of right artificial knee joint: Secondary | ICD-10-CM | POA: Insufficient documentation

## 2018-02-18 DIAGNOSIS — E119 Type 2 diabetes mellitus without complications: Secondary | ICD-10-CM | POA: Insufficient documentation

## 2018-02-18 DIAGNOSIS — R0789 Other chest pain: Secondary | ICD-10-CM | POA: Diagnosis not present

## 2018-02-18 DIAGNOSIS — J45909 Unspecified asthma, uncomplicated: Secondary | ICD-10-CM | POA: Diagnosis not present

## 2018-02-18 DIAGNOSIS — R05 Cough: Secondary | ICD-10-CM | POA: Diagnosis not present

## 2018-02-18 DIAGNOSIS — R079 Chest pain, unspecified: Secondary | ICD-10-CM | POA: Diagnosis not present

## 2018-02-18 LAB — I-STAT TROPONIN, ED: Troponin i, poc: 0 ng/mL (ref 0.00–0.08)

## 2018-02-18 LAB — CBC
HCT: 33.9 % — ABNORMAL LOW (ref 36.0–46.0)
Hemoglobin: 10.3 g/dL — ABNORMAL LOW (ref 12.0–15.0)
MCH: 22.1 pg — AB (ref 26.0–34.0)
MCHC: 30.4 g/dL (ref 30.0–36.0)
MCV: 72.6 fL — AB (ref 78.0–100.0)
PLATELETS: 182 10*3/uL (ref 150–400)
RBC: 4.67 MIL/uL (ref 3.87–5.11)
RDW: 17.2 % — AB (ref 11.5–15.5)
WBC: 5.6 10*3/uL (ref 4.0–10.5)

## 2018-02-18 LAB — BASIC METABOLIC PANEL
Anion gap: 9 (ref 5–15)
BUN: 14 mg/dL (ref 6–20)
CALCIUM: 11 mg/dL — AB (ref 8.9–10.3)
CHLORIDE: 107 mmol/L (ref 101–111)
CO2: 23 mmol/L (ref 22–32)
CREATININE: 0.93 mg/dL (ref 0.44–1.00)
GFR calc Af Amer: 60 mL/min — ABNORMAL LOW (ref >60)
GFR calc non Af Amer: 52 mL/min — ABNORMAL LOW (ref >60)
Glucose, Bld: 130 mg/dL — ABNORMAL HIGH (ref 65–99)
Potassium: 3.2 mmol/L — ABNORMAL LOW (ref 3.5–5.1)
Sodium: 139 mmol/L (ref 135–145)

## 2018-02-18 MED ORDER — SODIUM CHLORIDE 0.9 % IV SOLN: 1.0000 g | Freq: Once | INTRAVENOUS | Status: DC

## 2018-02-18 MED ORDER — AZITHROMYCIN 250 MG PO TABS
500.0000 mg | ORAL_TABLET | Freq: Once | ORAL | Status: AC
  Administered 2018-02-18: 500 mg via ORAL
  Filled 2018-02-18: qty 2

## 2018-02-18 MED ORDER — LIDOCAINE HCL (PF) 1 % IJ SOLN
INTRAMUSCULAR | Status: AC
  Filled 2018-02-18: qty 5

## 2018-02-18 MED ORDER — AZITHROMYCIN 250 MG PO TABS: ORAL_TABLET | ORAL | 0 refills | Status: DC

## 2018-02-18 MED ORDER — CEFTRIAXONE SODIUM 1 G IJ SOLR
1.0000 g | Freq: Once | INTRAMUSCULAR | Status: AC
  Administered 2018-02-18: 1 g via INTRAMUSCULAR
  Filled 2018-02-18: qty 10

## 2018-02-18 NOTE — ED Notes (Signed)
Iv team at bedside attempting saline lock line.

## 2018-02-18 NOTE — ED Provider Notes (Signed)
[facility] EMERGENCY DEPARTMENT Provider Note   CSN: [id number] Arrival date & time: 02/18/18  1424     History   Chief Complaint Chief Complaint  Patient presents with  . Chest Pain  . Cough    HPI [name] is a 84 y.o. female.  Pt presents to the ED today with cough and cp.  The pt said sx started yesterday.  No f/c.  Pt lives at home with her daughter.  She is wheelchair/bed bound.  She said her daughter is too busy and unable to help her.  She did take some cough medication this morning which did not help.     Past Medical History:  Diagnosis Date  . Allergy   . Anemia    Iron def anemia  . Arthritis   . Asthma   . Breast cancer ([place]) 10/2010   Invasive Ductal Carcinoma, s/p bilateral mastectomy, now on Tamoxifen. Followed by Dr Jamse Arn.   . Breast cancer ([place]) 2011   s/p Bilateral masectomy  . CAD (coronary artery disease)   . Carotid artery aneurysm ([place]) 08/2010   right ICA, 5 x 68mm  . CHF (congestive heart failure) (HCC)    Systolic   . Colon cancer ([place])   . Colon cancer ([place]) 12/11/2013  . DDD (degenerative disc disease), cervical   . Diabetes mellitus   . Diverticulosis   . GERD (gastroesophageal reflux disease)   . H/O: GI bleed   . Hiatal hernia   . History of colon cancer   . Hypercalcemia 06/09/2014  . Hyperlipidemia   . Hypertension   . Myocardial infarction ([place])   . Neuropathy   . Pneumonia 03/2012  . Shortness of breath     Patient Active Problem List   Diagnosis Date Noted  . Swelling of left hand 12/10/2017  . Cough in adult   . Left knee pain 08/09/2017  . Protein-calorie malnutrition, severe 02/06/2017  . Hypokalemia 10/13/2016  . Pressure injury of skin 08/31/2016  . Pulmonary embolus ([place]) 08/30/2016  . Mild dementia 06/01/2016  . Depression 01/20/2016  . Suicidal ideation 01/19/2016  . Prolapse of female pelvic organs 12/28/2015  . Leg weakness, bilateral 12/08/2015  . Weakness of right lower  extremity 11/25/2015  . Dry eyes 10/28/2015  . (HFpEF) heart failure with preserved ejection fraction ([place])   . CAD in native artery   . Gout 05/14/2015  . Dyslipidemia   . Essential hypertension   . Seasonal allergies 02/10/2015  . Stroke-like symptoms   . Stroke-like episode ([place]) 01/25/2015  . Syncope and collapse 01/25/2015  . Dizziness 09/19/2014  . Anemia of chronic illness 08/19/2014  . Acute superficial venous thrombosis of right lower extremity 08/19/2014  . Hypercalcemia 06/09/2014  . Breast cancer, left breast ([place]) 06/09/2014  . Breast cancer, right breast ([place]) 06/09/2014  . Edema of left lower extremity 03/30/2014  . PAD (peripheral artery disease) ([place]) 03/30/2014  . History of colon cancer 12/11/2013  . History of fall 05/15/2013  . Bilateral leg pain 03/07/2013  . Lower extremity edema 02/19/2013  . Debility 01/22/2013  . Stricture and stenosis of esophagus 10/27/2012  . Constipation 06/24/2012  . Thoracic back pain 05/24/2012  . Tobacco abuse 06/02/2011  . History of breast cancer in female, bilateral.  Mastectomies 10/24/2010. 04/26/2011  . Hiatal hernia 03/22/2011  . Colon polyp 03/22/2011  . Poor circulation 03/22/2011  . Cough 01/31/2011  . 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 Neuropathy ([place]) 08/25/2009  .  ANEMIA-IRON DEFICIENCY 04/29/2007  . HLD (hyperlipidemia) 01/29/2007  . Controlled type 2 diabetes mellitus without complication, without long-term current use of insulin ([place]) 01/10/2007  . HYPERTENSION, BENIGN SYSTEMIC 01/10/2007  . Coronary atherosclerosis 01/10/2007  . Chronic systolic heart failure ([place]) 01/10/2007  . Asthma 01/10/2007  . Reflux esophagitis 01/10/2007  . Osteoarthritis, multiple sites 01/10/2007    Past Surgical History:  Procedure Laterality Date  . Breast masectomy  10/2010   Bilateral masectomy by Dr Lucia Gaskins s/p invasive ductal carcinoma.  Marland Kitchen BREAST SURGERY    . Cardiac  Catherization  2007   Severe 3-vessel  disease.  EF 20-25%.  . ESOPHAGOGASTRODUODENOSCOPY  2001   Esophageal Tear  . ESOPHAGOGASTRODUODENOSCOPY  10/27/2012   Procedure: ESOPHAGOGASTRODUODENOSCOPY (EGD);  Surgeon: Irene Shipper, MD;  [facility] END[facility]COPY;  Service: Endoscopy;  Laterality: N/A;  pat  . JOINT REPLACEMENT Right 2001   Knee  . TOTAL KNEE ARTHROPLASTY  2001     OB History   None      Home Medications    Prior to Admission medications   Medication Sig Start Date End Date Taking? Authorizing Provider  acetaminophen (TYLENOL) 500 MG tablet Take 500 mg by mouth every 6 (six) hours as needed for moderate pain.     [provider]  albuterol (PROVENTIL HFA;VENTOLIN HFA) 108 (90 BASE) MCG/ACT inhaler Inhale 2 puffs into the lungs every 6 (six) hours as needed. For shortness of breath Patient taking differently: Inhale 2 puffs into the lungs every 6 (six) hours as needed for shortness of breath.  01/18/15   [name], MD  Amino Acids-Protein Hydrolys (FEEDING SUPPLEMENT, PRO-STAT SUGAR FREE 64,) LIQD Take 30 mLs by mouth 2 (two) times daily with a meal. ensure    [provider]  amLODipine (NORVASC) 5 MG tablet Take 1 tablet (5 mg total) by mouth daily. 12/21/17   [place] Bing, DO  aspirin (ASPIRIN CHILDRENS) 81 MG chewable tablet Chew 81 mg by mouth daily.     [provider]  azithromycin (ZITHROMAX) 250 MG tablet Take 1 tablet every day until finished. 02/18/18   Isla Pence, MD  capsaicin (ZOSTRIX) 0.025 % cream APPLY TO AFFECTED AREA TWICE A DAY Patient not taking: Reported on 11/19/2017 04/11/16   [name], MD  carvedilol (COREG) 12.5 MG tablet TAKE 1 TABLET BY MOUTH TWICE A DAY WITH A MEAL Patient taking differently: Take 12.5 mg by mouth two times a day with a meal 10/25/17   [place] Bing, DO  ferrous sulfate 325 (65 FE) MG tablet Take 1 tablet (325 mg total) by mouth daily with breakfast. 11/21/16   [name], MD  guaifenesin (ROBITUSSIN) 100 MG/5ML syrup  Take 10 mLs (200 mg total) by mouth 3 (three) times daily as needed for cough. 08/15/16   [name], MD  hydroxypropyl methylcellulose / hypromellose (ISOPTO TEARS / GONIOVISC) 2.5 % ophthalmic solution Place 1 drop 3 (three) times daily into both eyes. Patient not taking: Reported on 11/19/2017 09/28/17   [place] Bing, DO  loratadine (CLARITIN) 10 MG tablet TAKE 1/2 TABLET BY MOUTH DAILY Patient taking differently: TAKE 5 mg  TABLET BY MOUTH as needed 12/08/15   [name], MD  losartan (COZAAR) 50 MG tablet TAKE 1 TABLET (50 MG TOTAL) BY MOUTH AT BEDTIME. 12/21/17   [place] Bing, DO  metFORMIN (GLUCOPHAGE) 500 MG tablet Take 1 tablet (500 mg total) by mouth daily with breakfast. 07/31/17   [name]  J, DO  PATADAY 0.2 % SOLN APPLY 1 DROP TO EYE DAILY. Patient taking differently: Instill 1 drop into each eye once a day 11/23/15   [name], MD  petrolatum-hydrophilic-aloe vera (ALOE VESTA) ointment Apply topically 2 (two) times daily as needed for wound care. Patient not taking: Reported on 11/19/2017 05/10/17   Ok Edwards, PA-C  ranitidine (ZANTAC) 150 MG tablet TAKE 1 TABLET BY MOUTH TWICE A DAY Patient taking differently: 150MG  BY MOUTH TWICE DAILY 10/15/17   [place] Bing, DO  rosuvastatin (CRESTOR) 20 MG tablet TAKE 1 TABLET BY MOUTH EVERYDAY AT BEDTIME 01/18/18   [place] Bing, DO  senna-docusate (SENOKOT-S) 8.6-50 MG tablet Take 2 tablets by mouth at bedtime. Patient taking differently: Take 1 tablet by mouth every other day.  04/23/17   [place]ia Crews, MD  silver sulfADIAZINE (SILVADENE) 1 % cream Apply 1 application topically daily. Patient taking differently: Apply 1 application topically daily as needed.  05/15/17   Tuchman, Richard Loletha Grayer, DPM  traMADol (ULTRAM) 50 MG tablet Take 1 tablet (50 mg total) by mouth every 8 (eight) hours as needed for severe pain. 12/03/17   [place] Dawn, MD  cetirizine (ZYRTEC) 5 MG tablet Take 1 tablet (5 mg total) by  mouth daily. Take at night time as it can cause some drowsiness 05/24/12 07/09/12  Losq, Burnell Blanks, MD    Family History Family History  Problem Relation Age of Onset  . Sickle cell anemia Other   . Heart disease Mother   . Heart disease Father   . Cancer Daughter 73       breast ca  . Hypertension Daughter   . Cancer Daughter 77       breast ca  . Hypertension Daughter   . Heart disease Son        Poor circulation-Left Leg  . Diabetes Daughter   . Hypertension Daughter   . Hyperlipidemia Daughter        Poor circulation- Toe amputation    Social History Social History   Tobacco Use  . Smoking status: Never Smoker  . Smokeless tobacco: Current User    Types: Snuff  Substance Use Topics  . Alcohol use: No  . Drug use: No     Allergies   Tape and Lisinopril   Review of Systems Review of Systems  Respiratory: Positive for cough.   Cardiovascular: Positive for chest pain.  All other systems reviewed and are negative.    Physical Exam Updated Vital Signs BP (!) 163/70   Pulse 70   Temp 98 F (36.7 C) (Oral)   Resp 16   SpO2 99%   Physical Exam  Constitutional: She is oriented to person, place, and time. She appears well-developed and well-nourished.  HENT:  Head: Normocephalic and atraumatic.  Eyes: Pupils are equal, round, and reactive to light. EOM are normal.  Neck: Normal range of motion. Neck supple.  Cardiovascular: Normal rate, regular rhythm, intact distal pulses and normal pulses.  Pulmonary/Chest: Effort normal and breath sounds normal.  Abdominal: Soft. Bowel sounds are normal.  Musculoskeletal: Normal range of motion.  Neurological: She is alert and oriented to person, place, and time.  Skin: Skin is warm and dry. Capillary refill takes less than 2 seconds.  Psychiatric: She has a normal mood and affect. Her behavior is normal.  Vitals reviewed.    ED Treatments / Results  Labs (all labs ordered are listed, but only abnormal results  are displayed) Labs Reviewed  BASIC METABOLIC PANEL - Abnormal; Notable for the following components:      Result Value   Potassium 3.2 (*)    Glucose, Bld 130 (*)    Calcium 11.0 (*)    GFR calc non Af Amer 52 (*)    GFR calc Af Amer 60 (*)    All other components within normal limits  CBC - Abnormal; Notable for the following components:   Hemoglobin 10.3 (*)    HCT 33.9 (*)    MCV 72.6 (*)    MCH 22.1 (*)    RDW 17.2 (*)    All other components within normal limits  I-STAT TROPONIN, ED  I-STAT TROPONIN, ED    EKG EKG Interpretation  Date/Time:  Monday February 18 2018 14:27:52 EDT Ventricular Rate:  77 PR Interval:  184 QRS Duration: 72 QT Interval:  384 QTC Calculation: 434 R Axis:   -24 Text Interpretation:  Normal sinus rhythm Moderate voltage criteria for LVH, may be normal variant Nonspecific T wave abnormality Abnormal ECG No significant change since last tracing Confirmed by Isla Pence [phone]) on 02/18/2018 7:09:58 PM   Radiology Dg Chest 2 View  Result Date: 02/18/2018 CLINICAL DATA:  Cough and chest pain EXAM: CHEST - 2 VIEW COMPARISON:  PA and lateral chest x-ray of November 25, 2017 FINDINGS: The lungs are adequately inflated. The lung markings are coarse at both bases and more conspicuous than on the previous study. There is no pleural effusion. The heart is top-normal in size. The pulmonary vascularity is normal. There is calcification in the wall of the aortic arch. There is mild multilevel degenerative disc disease of the thoracic spine. IMPRESSION: Bibasilar atelectasis or developing pneumonia demonstrated best on the lateral view. Followup PA and lateral chest X-ray is recommended in 3-4 weeks following trial of antibiotic therapy to ensure resolution and exclude underlying malignancy. Thoracic aortic atherosclerosis. Electronically Signed   By: David  Martinique M.D.   On: 02/18/2018 16:34    Procedures Procedures (including critical care time)  Medications  Ordered in ED Medications  cefTRIAXone (ROCEPHIN) 1 g in sodium chloride 0.9 % 100 mL IVPB (has no administration in time range)  azithromycin (ZITHROMAX) tablet 500 mg (has no administration in time range)  cefTRIAXone (ROCEPHIN) injection 1 g (has no administration in time range)     Initial Impression / Assessment and Plan / ED Course  I have reviewed the triage vital signs and the nursing notes.  Pertinent labs & imaging results that were available during my care of the patient were reviewed by me and considered in my medical decision making (see chart for details).    Pt looks nontoxic.  She is saturating well.  She is stable for d/c home.  I spoke with case management and with social work.  They will help arrange more home health for help at home.  Pt is encouraged to return if worse and to f/u with pcp.  Final Clinical Impressions(s) / ED Diagnoses   Final diagnoses:  Community acquired pneumonia, unspecified laterality  Atypical chest pain    ED Discharge Orders        Ordered    azithromycin (ZITHROMAX) 250 MG tablet     02/18/18 2110       Isla Pence, MD 02/18/18 2110

## 2018-02-18 NOTE — ED Triage Notes (Signed)
Pt reports left side chest/abd pain that started yesterday. Denies any injury. Pt reports recent cough and cold symptoms. Cough is productive. Has nausea, no vomiting.

## 2018-02-18 NOTE — ED Notes (Signed)
Pt alert and oriented x4. Pt afebrile. CC of left side chest pain. Has slight nonproductive cough. Pt concerned to live at home with daughter, states is unable to help take care of her. CSW talked with patient and family.

## 2018-02-18 NOTE — Care Management (Addendum)
ED CM consulted by Dr. Gilford Raid concerning recommendations for [facility] services. Pt presented to ED with c/o weakness. Met with patient and family at bedside, verbal consent given to discuss care in the presence of family. Verified information with patient and family. Pt lives at home at home with son and daughter-in-law primary care giver. Discussed the recommendation for Home Health services, patient and family agreeable with this disposition plan. Discussed HH services RN/PT/OT/HHA . Offered choice, provided [facility] agency list, Patient selected [facility] services. No DME needs identified/ DME needs identified: rolling walker, w/c and 3:1 chair at home. Verified current address and phone number with patient and family, [facility] Referral faxed to [phone] Fax confirmation received. Updated Dr. Rosario Jacks C who is  agreeable with discharge plan. Informed patient and family that  A nurse from [facility] will contact them at the number provided within 24 -48hr to set up a the initial assessment visit, they verbalized understanding teach back done.  [facility] brochure given with contact information should any questions or concerns  arise regarding  [facility]. Pt and family appreicative and agrees with discharge plan. ED CM will follow up with patient and family towards the end of the week.

## 2018-02-20 ENCOUNTER — Other Ambulatory Visit: Payer: Self-pay | Admitting: *Deleted

## 2018-02-20 NOTE — Patient Outreach (Signed)
[facility]) Care Management  02/20/2018  [name] 1924-09-22 [id number]   Referral received from ED case manager as member has had 2 ED visits in the past 6 months, most recently for pneumonia.  She's also had 1 hospital admission.  Per chart, she has history of hypertension, heart failure, PAD, diabetes and hyperlipidemia.  Call placed to member, no answer.  HIPAA compliant voice message left.  Unsuccessful outreach letter sent, will follow up in 3-4 business days.    [name], [place], MSN [place] [phone]

## 2018-02-22 DIAGNOSIS — F329 Major depressive disorder, single episode, unspecified: Secondary | ICD-10-CM | POA: Diagnosis not present

## 2018-02-22 DIAGNOSIS — K5790 Diverticulosis of intestine, part unspecified, without perforation or abscess without bleeding: Secondary | ICD-10-CM | POA: Diagnosis not present

## 2018-02-22 DIAGNOSIS — M1990 Unspecified osteoarthritis, unspecified site: Secondary | ICD-10-CM | POA: Diagnosis not present

## 2018-02-22 DIAGNOSIS — K219 Gastro-esophageal reflux disease without esophagitis: Secondary | ICD-10-CM | POA: Diagnosis not present

## 2018-02-22 DIAGNOSIS — Z791 Long term (current) use of non-steroidal anti-inflammatories (NSAID): Secondary | ICD-10-CM | POA: Diagnosis not present

## 2018-02-22 DIAGNOSIS — D509 Iron deficiency anemia, unspecified: Secondary | ICD-10-CM | POA: Diagnosis not present

## 2018-02-22 DIAGNOSIS — J45909 Unspecified asthma, uncomplicated: Secondary | ICD-10-CM | POA: Diagnosis not present

## 2018-02-22 DIAGNOSIS — Z85038 Personal history of other malignant neoplasm of large intestine: Secondary | ICD-10-CM | POA: Diagnosis not present

## 2018-02-22 DIAGNOSIS — Z7984 Long term (current) use of oral hypoglycemic drugs: Secondary | ICD-10-CM | POA: Diagnosis not present

## 2018-02-22 DIAGNOSIS — Z7982 Long term (current) use of aspirin: Secondary | ICD-10-CM | POA: Diagnosis not present

## 2018-02-22 DIAGNOSIS — I2510 Atherosclerotic heart disease of native coronary artery without angina pectoris: Secondary | ICD-10-CM | POA: Diagnosis not present

## 2018-02-22 DIAGNOSIS — E785 Hyperlipidemia, unspecified: Secondary | ICD-10-CM | POA: Diagnosis not present

## 2018-02-22 DIAGNOSIS — Z7951 Long term (current) use of inhaled steroids: Secondary | ICD-10-CM | POA: Diagnosis not present

## 2018-02-22 DIAGNOSIS — G629 Polyneuropathy, unspecified: Secondary | ICD-10-CM | POA: Diagnosis not present

## 2018-02-22 DIAGNOSIS — C50912 Malignant neoplasm of unspecified site of left female breast: Secondary | ICD-10-CM | POA: Diagnosis not present

## 2018-02-22 DIAGNOSIS — I5020 Unspecified systolic (congestive) heart failure: Secondary | ICD-10-CM | POA: Diagnosis not present

## 2018-02-22 DIAGNOSIS — I110 Hypertensive heart disease with heart failure: Secondary | ICD-10-CM | POA: Diagnosis not present

## 2018-02-22 DIAGNOSIS — E119 Type 2 diabetes mellitus without complications: Secondary | ICD-10-CM | POA: Diagnosis not present

## 2018-02-22 DIAGNOSIS — J189 Pneumonia, unspecified organism: Secondary | ICD-10-CM | POA: Diagnosis not present

## 2018-02-25 ENCOUNTER — Other Ambulatory Visit: Payer: Self-pay | Admitting: *Deleted

## 2018-02-25 DIAGNOSIS — C50912 Malignant neoplasm of unspecified site of left female breast: Secondary | ICD-10-CM | POA: Diagnosis not present

## 2018-02-25 DIAGNOSIS — I5020 Unspecified systolic (congestive) heart failure: Secondary | ICD-10-CM | POA: Diagnosis not present

## 2018-02-25 DIAGNOSIS — I110 Hypertensive heart disease with heart failure: Secondary | ICD-10-CM | POA: Diagnosis not present

## 2018-02-25 DIAGNOSIS — I2510 Atherosclerotic heart disease of native coronary artery without angina pectoris: Secondary | ICD-10-CM | POA: Diagnosis not present

## 2018-02-25 DIAGNOSIS — J189 Pneumonia, unspecified organism: Secondary | ICD-10-CM | POA: Diagnosis not present

## 2018-02-25 DIAGNOSIS — E119 Type 2 diabetes mellitus without complications: Secondary | ICD-10-CM | POA: Diagnosis not present

## 2018-02-25 NOTE — Patient Outreach (Signed)
[facility]) Care Management  02/25/2018  [name] 06-Jan-1924 [id number]   2nd attempt to contact member for engagement with [facility].  Spoke with daughter, [name], with whom member lives.  She report that member has been doing well with the exception of ED visit for Pneumonia.  She state member had pneumonia earlier this year, which was complicated by her COPD.  She report member has a great support system with herself and her brother/his will.  They all provide transportation to [facility] MD appointments, [name] provides assistance with medications.  She report member has personal care services for 2.5 hours daily, and also is involved with home health through [place].  She state she is unsure what additional assistance is needed, feel they are caring well for member, but she does agree to home visit within the next 2 weeks.  Denies any urgent concerns, provided with this care manager's contact information.  Advised to contact with any questions.  [name], [place], MSN [place] [phone]

## 2018-02-27 DIAGNOSIS — I110 Hypertensive heart disease with heart failure: Secondary | ICD-10-CM | POA: Diagnosis not present

## 2018-02-27 DIAGNOSIS — J189 Pneumonia, unspecified organism: Secondary | ICD-10-CM | POA: Diagnosis not present

## 2018-02-27 DIAGNOSIS — C50912 Malignant neoplasm of unspecified site of left female breast: Secondary | ICD-10-CM | POA: Diagnosis not present

## 2018-02-27 DIAGNOSIS — I2510 Atherosclerotic heart disease of native coronary artery without angina pectoris: Secondary | ICD-10-CM | POA: Diagnosis not present

## 2018-02-27 DIAGNOSIS — E119 Type 2 diabetes mellitus without complications: Secondary | ICD-10-CM | POA: Diagnosis not present

## 2018-02-27 DIAGNOSIS — I5020 Unspecified systolic (congestive) heart failure: Secondary | ICD-10-CM | POA: Diagnosis not present

## 2018-03-01 DIAGNOSIS — F0390 Unspecified dementia without behavioral disturbance: Secondary | ICD-10-CM | POA: Diagnosis not present

## 2018-03-01 DIAGNOSIS — E43 Unspecified severe protein-calorie malnutrition: Secondary | ICD-10-CM | POA: Diagnosis not present

## 2018-03-01 DIAGNOSIS — Z0000 Encounter for general adult medical examination without abnormal findings: Secondary | ICD-10-CM | POA: Diagnosis not present

## 2018-03-01 DIAGNOSIS — E119 Type 2 diabetes mellitus without complications: Secondary | ICD-10-CM | POA: Diagnosis not present

## 2018-03-01 DIAGNOSIS — I509 Heart failure, unspecified: Secondary | ICD-10-CM | POA: Diagnosis not present

## 2018-03-01 DIAGNOSIS — F341 Dysthymic disorder: Secondary | ICD-10-CM | POA: Diagnosis not present

## 2018-03-06 ENCOUNTER — Encounter: Payer: Self-pay | Admitting: Podiatry

## 2018-03-06 ENCOUNTER — Ambulatory Visit (INDEPENDENT_AMBULATORY_CARE_PROVIDER_SITE_OTHER): Payer: Medicare Other | Admitting: Podiatry

## 2018-03-06 ENCOUNTER — Other Ambulatory Visit: Payer: Medicare Other

## 2018-03-06 DIAGNOSIS — L97401 Non-pressure chronic ulcer of unspecified heel and midfoot limited to breakdown of skin: Secondary | ICD-10-CM | POA: Diagnosis not present

## 2018-03-06 DIAGNOSIS — I110 Hypertensive heart disease with heart failure: Secondary | ICD-10-CM | POA: Diagnosis not present

## 2018-03-06 DIAGNOSIS — I5020 Unspecified systolic (congestive) heart failure: Secondary | ICD-10-CM | POA: Diagnosis not present

## 2018-03-06 DIAGNOSIS — C50912 Malignant neoplasm of unspecified site of left female breast: Secondary | ICD-10-CM | POA: Diagnosis not present

## 2018-03-06 DIAGNOSIS — M79675 Pain in left toe(s): Secondary | ICD-10-CM

## 2018-03-06 DIAGNOSIS — M79674 Pain in right toe(s): Secondary | ICD-10-CM

## 2018-03-06 DIAGNOSIS — I2510 Atherosclerotic heart disease of native coronary artery without angina pectoris: Secondary | ICD-10-CM | POA: Diagnosis not present

## 2018-03-06 DIAGNOSIS — B351 Tinea unguium: Secondary | ICD-10-CM

## 2018-03-06 DIAGNOSIS — J189 Pneumonia, unspecified organism: Secondary | ICD-10-CM | POA: Diagnosis not present

## 2018-03-06 DIAGNOSIS — E119 Type 2 diabetes mellitus without complications: Secondary | ICD-10-CM | POA: Diagnosis not present

## 2018-03-06 NOTE — Progress Notes (Signed)
Subjective:   Patient ID: [name], female   DOB: 84 y.o.   MRN: [id number]   HPI Patient presents with elongated incurvated nailbeds 1-5 both feet that are thick and painful   ROS      Objective:  Physical Exam  Thick yellow brittle nailbeds 1-5 both feet with pain     Assessment:  Mycotic nail infection 1-5 both feet with pain     Plan:  Debride painful nailbeds 1-5 both feet with no iatrogenic bleeding noted

## 2018-03-08 DIAGNOSIS — I5020 Unspecified systolic (congestive) heart failure: Secondary | ICD-10-CM | POA: Diagnosis not present

## 2018-03-08 DIAGNOSIS — E119 Type 2 diabetes mellitus without complications: Secondary | ICD-10-CM | POA: Diagnosis not present

## 2018-03-08 DIAGNOSIS — I2510 Atherosclerotic heart disease of native coronary artery without angina pectoris: Secondary | ICD-10-CM | POA: Diagnosis not present

## 2018-03-08 DIAGNOSIS — C50912 Malignant neoplasm of unspecified site of left female breast: Secondary | ICD-10-CM | POA: Diagnosis not present

## 2018-03-08 DIAGNOSIS — J189 Pneumonia, unspecified organism: Secondary | ICD-10-CM | POA: Diagnosis not present

## 2018-03-08 DIAGNOSIS — I110 Hypertensive heart disease with heart failure: Secondary | ICD-10-CM | POA: Diagnosis not present

## 2018-03-11 ENCOUNTER — Encounter: Payer: Self-pay | Admitting: *Deleted

## 2018-03-11 ENCOUNTER — Other Ambulatory Visit: Payer: Self-pay | Admitting: *Deleted

## 2018-03-11 NOTE — Patient Outreach (Signed)
[facility]) Care Management   03/11/2018  DESTYNEE STRINGFELLOW Dec 29, 1923 [id number]  YEMARIAM MAGAR is an 84 y.o. female  Subjective:   Member alert and oriented x3, denies complaints of pain or discomfort.    Objective:   Review of Systems  Constitutional: Negative.   HENT: Negative.   Eyes: Negative.   Respiratory: Negative.   Cardiovascular: Negative.   Musculoskeletal: Negative.   Skin: Negative.   Neurological: Positive for weakness.  Endo/Heme/Allergies: Negative.   Psychiatric/Behavioral: Negative.     Physical Exam  Constitutional: She is oriented to person, place, and time. She appears well-developed and well-nourished.  Neck: Normal range of motion.  Cardiovascular: Normal rate, regular rhythm and normal heart sounds.  Respiratory: Effort normal and breath sounds normal.  GI: Soft. Bowel sounds are normal.  Musculoskeletal: Normal range of motion.  Neurological: She is alert and oriented to person, place, and time.  Skin: Skin is warm and dry.   BP (!) 142/78 (BP Location: Left Arm, Patient Position: Sitting, Cuff Size: Normal)   Pulse 76   Resp 18   Ht 1.626 m (5' 4")   Wt 115 lb (52.2 kg)   SpO2 98%   BMI 19.74 kg/m   Encounter Medications:   Outpatient Encounter Medications as of 03/11/2018  Medication Sig Note  . acetaminophen (TYLENOL) 500 MG tablet Take 500 mg by mouth every 6 (six) hours as needed for moderate pain.    Marland Kitchen amLODipine (NORVASC) 5 MG tablet Take 1 tablet (5 mg total) by mouth daily.   Marland Kitchen aspirin (ASPIRIN CHILDRENS) 81 MG chewable tablet Chew 81 mg by mouth daily.    . carvedilol (COREG) 12.5 MG tablet TAKE 1 TABLET BY MOUTH TWICE A DAY WITH A MEAL (Patient taking differently: Take 12.5 mg by mouth two times a day with a meal)   . gabapentin (NEURONTIN) 300 MG capsule Take 300 mg by mouth daily.   Marland Kitchen guaifenesin (ROBITUSSIN) 100 MG/5ML syrup Take 10 mLs (200 mg total) by mouth 3 (three) times daily as needed for cough.   .  losartan (COZAAR) 50 MG tablet TAKE 1 TABLET (50 MG TOTAL) BY MOUTH AT BEDTIME.   . metFORMIN (GLUCOPHAGE) 500 MG tablet Take 1 tablet (500 mg total) by mouth daily with breakfast.   . ranitidine (ZANTAC) 150 MG tablet TAKE 1 TABLET BY MOUTH TWICE A DAY (Patient taking differently: 150MG BY MOUTH TWICE DAILY) 11/26/2017: On hold per [facility]  . rosuvastatin (CRESTOR) 20 MG tablet TAKE 1 TABLET BY MOUTH EVERYDAY AT BEDTIME   . senna-docusate (SENOKOT-S) 8.6-50 MG tablet Take 2 tablets by mouth at bedtime. (Patient taking differently: Take 1 tablet by mouth every other day. )   . traMADol (ULTRAM) 50 MG tablet Take 1 tablet (50 mg total) by mouth every 8 (eight) hours as needed for severe pain.   Marland Kitchen albuterol (PROVENTIL HFA;VENTOLIN HFA) 108 (90 BASE) MCG/ACT inhaler Inhale 2 puffs into the lungs every 6 (six) hours as needed. For shortness of breath (Patient not taking: Reported on 03/11/2018)   . Amino Acids-Protein Hydrolys (FEEDING SUPPLEMENT, PRO-STAT SUGAR FREE 64,) LIQD Take 30 mLs by mouth 2 (two) times daily with a meal. ensure   . azithromycin (ZITHROMAX) 250 MG tablet Take 1 tablet every day until finished. (Patient not taking: Reported on 03/11/2018)   . capsaicin (ZOSTRIX) 0.025 % cream APPLY TO AFFECTED AREA TWICE A DAY (Patient not taking: Reported on 03/11/2018)   . ferrous sulfate 325 (65 FE) MG  tablet Take 1 tablet (325 mg total) by mouth daily with breakfast. (Patient not taking: Reported on 03/11/2018)   . hydroxypropyl methylcellulose / hypromellose (ISOPTO TEARS / GONIOVISC) 2.5 % ophthalmic solution Place 1 drop 3 (three) times daily into both eyes. (Patient not taking: Reported on 03/11/2018)   . loratadine (CLARITIN) 10 MG tablet TAKE 1/2 TABLET BY MOUTH DAILY (Patient not taking: Reported on 03/11/2018)   . PATADAY 0.2 % SOLN APPLY 1 DROP TO EYE DAILY. (Patient not taking: Reported on 03/11/2018)   . petrolatum-hydrophilic-aloe vera (ALOE VESTA) ointment Apply topically 2 (two) times daily as  needed for wound care. (Patient not taking: Reported on 03/11/2018)   . silver sulfADIAZINE (SILVADENE) 1 % cream Apply 1 application topically daily. (Patient not taking: Reported on 03/11/2018)   . [DISCONTINUED] cetirizine (ZYRTEC) 5 MG tablet Take 1 tablet (5 mg total) by mouth daily. Take at night time as it can cause some drowsiness    No facility-administered encounter medications on file as of 03/11/2018.     Functional Status:   In your present state of health, do you have any difficulty performing the following activities: 03/11/2018 11/25/2017  Hearing? N N  Vision? Y N  Difficulty concentrating or making decisions? N N  Walking or climbing stairs? Y Y  Dressing or bathing? Y Y  Doing errands, shopping? Tempie Donning  Preparing Food and eating ? Y -  Using the Toilet? N -  In the past six months, have you accidently leaked urine? N -  Do you have problems with loss of bowel control? N -  Managing your Medications? Y -  Managing your Finances? Y -  Housekeeping or managing your Housekeeping? Y -  Some recent data might be hidden    Fall/Depression Screening:    Fall Risk  03/11/2018 12/03/2017 09/28/2017  Falls in the past year? No No No  Number falls in past yr: - - -  Injury with Fall? - - -  Risk Factor Category  - - -  Risk for fall due to : - - -  Follow up - - -   PHQ 2/9 Scores 03/11/2018 12/03/2017 09/28/2017 08/09/2017 05/02/2017 04/23/2017 02/07/2017  PHQ - 2 Score 1 0 0 0 0 0 0  PHQ- 9 Score - - - - - - -  Exception Documentation - - - - - - -  Not completed - - - - - - -    Assessment:    Met with member at scheduled time.  Daughter in the home during visit, but not involved.  Home aide present during visit.  Home aide is with member 5 days/week, 3 hours/day.  She provides assistance with bathing, dressing, and meals.    Member report she was recently seen in the ED and diagnosed with pneumonia, but she report her biggest concern at this time is decreased mobility.  She  has recently had PT restarted, report they are coming twice a week.    Medications reviewed, some discrepancies.  Gabapentin not on list, but report taking.  Agrees to have [facility] pharmacist contact her regarding medication reconciliation.  She denies any further concerns, provided with this care manager's contact information.  Advised to contact with questions.  Plan:   Will follow up with member next month for routine home visit. Will place referral to pharmacist.  [facility] CM Care Plan Problem One     Most Recent Value  Care Plan Problem One  Recent ED visit related to pneumonia  Role Documenting the Problem One  Care Management [place] for Problem One  Active  [facility] Long Term Goal   Member will not have hospital admission or ED visits within the next 31 days  [facility] Long Term Goal Start Date  03/11/18  Interventions for Problem One Long Term Goal  Member provided with [facility] calendar tool book.  Advised of 24 hour nurse advise line, encouraged to use prior to presenting to ED for advice  [facility] CM Short Term Goal #1   Member will report compliance with home health for physical therapy over the next 4 weeks  [facility] CM Short Term Goal #1 Start Date  03/11/18  Interventions for Short Term Goal #1  Confirmed with member and aide that she has PT ordered.    [facility] CM Short Term Goal #2   Member will report increase in strength, will be able to walk to bathroom/kitchen within the next 4 weeks  [facility] CM Short Term Goal #2 Start Date  03/11/18  Interventions for Short Term Goal #2  Educated on importance of working with PT and performing exercises independently in effort to increase strength.     [name], [place], MSN [place] [phone]

## 2018-03-12 DIAGNOSIS — I110 Hypertensive heart disease with heart failure: Secondary | ICD-10-CM | POA: Diagnosis not present

## 2018-03-12 DIAGNOSIS — C50912 Malignant neoplasm of unspecified site of left female breast: Secondary | ICD-10-CM | POA: Diagnosis not present

## 2018-03-12 DIAGNOSIS — I2510 Atherosclerotic heart disease of native coronary artery without angina pectoris: Secondary | ICD-10-CM | POA: Diagnosis not present

## 2018-03-12 DIAGNOSIS — J189 Pneumonia, unspecified organism: Secondary | ICD-10-CM | POA: Diagnosis not present

## 2018-03-12 DIAGNOSIS — I5020 Unspecified systolic (congestive) heart failure: Secondary | ICD-10-CM | POA: Diagnosis not present

## 2018-03-12 DIAGNOSIS — E119 Type 2 diabetes mellitus without complications: Secondary | ICD-10-CM | POA: Diagnosis not present

## 2018-03-13 DIAGNOSIS — J189 Pneumonia, unspecified organism: Secondary | ICD-10-CM | POA: Diagnosis not present

## 2018-03-13 DIAGNOSIS — E119 Type 2 diabetes mellitus without complications: Secondary | ICD-10-CM | POA: Diagnosis not present

## 2018-03-13 DIAGNOSIS — I5020 Unspecified systolic (congestive) heart failure: Secondary | ICD-10-CM | POA: Diagnosis not present

## 2018-03-13 DIAGNOSIS — I2510 Atherosclerotic heart disease of native coronary artery without angina pectoris: Secondary | ICD-10-CM | POA: Diagnosis not present

## 2018-03-13 DIAGNOSIS — I110 Hypertensive heart disease with heart failure: Secondary | ICD-10-CM | POA: Diagnosis not present

## 2018-03-13 DIAGNOSIS — C50912 Malignant neoplasm of unspecified site of left female breast: Secondary | ICD-10-CM | POA: Diagnosis not present

## 2018-03-15 DIAGNOSIS — I2510 Atherosclerotic heart disease of native coronary artery without angina pectoris: Secondary | ICD-10-CM | POA: Diagnosis not present

## 2018-03-15 DIAGNOSIS — I5020 Unspecified systolic (congestive) heart failure: Secondary | ICD-10-CM | POA: Diagnosis not present

## 2018-03-15 DIAGNOSIS — C50912 Malignant neoplasm of unspecified site of left female breast: Secondary | ICD-10-CM | POA: Diagnosis not present

## 2018-03-15 DIAGNOSIS — I110 Hypertensive heart disease with heart failure: Secondary | ICD-10-CM | POA: Diagnosis not present

## 2018-03-15 DIAGNOSIS — J189 Pneumonia, unspecified organism: Secondary | ICD-10-CM | POA: Diagnosis not present

## 2018-03-15 DIAGNOSIS — E119 Type 2 diabetes mellitus without complications: Secondary | ICD-10-CM | POA: Diagnosis not present

## 2018-03-18 DIAGNOSIS — C50912 Malignant neoplasm of unspecified site of left female breast: Secondary | ICD-10-CM | POA: Diagnosis not present

## 2018-03-18 DIAGNOSIS — E119 Type 2 diabetes mellitus without complications: Secondary | ICD-10-CM | POA: Diagnosis not present

## 2018-03-18 DIAGNOSIS — I2510 Atherosclerotic heart disease of native coronary artery without angina pectoris: Secondary | ICD-10-CM | POA: Diagnosis not present

## 2018-03-18 DIAGNOSIS — I5020 Unspecified systolic (congestive) heart failure: Secondary | ICD-10-CM | POA: Diagnosis not present

## 2018-03-18 DIAGNOSIS — I110 Hypertensive heart disease with heart failure: Secondary | ICD-10-CM | POA: Diagnosis not present

## 2018-03-18 DIAGNOSIS — J189 Pneumonia, unspecified organism: Secondary | ICD-10-CM | POA: Diagnosis not present

## 2018-03-19 DIAGNOSIS — I2510 Atherosclerotic heart disease of native coronary artery without angina pectoris: Secondary | ICD-10-CM | POA: Diagnosis not present

## 2018-03-19 DIAGNOSIS — E119 Type 2 diabetes mellitus without complications: Secondary | ICD-10-CM | POA: Diagnosis not present

## 2018-03-19 DIAGNOSIS — I110 Hypertensive heart disease with heart failure: Secondary | ICD-10-CM | POA: Diagnosis not present

## 2018-03-19 DIAGNOSIS — I5020 Unspecified systolic (congestive) heart failure: Secondary | ICD-10-CM | POA: Diagnosis not present

## 2018-03-19 DIAGNOSIS — C50912 Malignant neoplasm of unspecified site of left female breast: Secondary | ICD-10-CM | POA: Diagnosis not present

## 2018-03-19 DIAGNOSIS — J189 Pneumonia, unspecified organism: Secondary | ICD-10-CM | POA: Diagnosis not present

## 2018-03-20 DIAGNOSIS — J189 Pneumonia, unspecified organism: Secondary | ICD-10-CM | POA: Diagnosis not present

## 2018-03-20 DIAGNOSIS — E119 Type 2 diabetes mellitus without complications: Secondary | ICD-10-CM | POA: Diagnosis not present

## 2018-03-20 DIAGNOSIS — I2510 Atherosclerotic heart disease of native coronary artery without angina pectoris: Secondary | ICD-10-CM | POA: Diagnosis not present

## 2018-03-20 DIAGNOSIS — I5020 Unspecified systolic (congestive) heart failure: Secondary | ICD-10-CM | POA: Diagnosis not present

## 2018-03-20 DIAGNOSIS — C50912 Malignant neoplasm of unspecified site of left female breast: Secondary | ICD-10-CM | POA: Diagnosis not present

## 2018-03-20 DIAGNOSIS — I110 Hypertensive heart disease with heart failure: Secondary | ICD-10-CM | POA: Diagnosis not present

## 2018-03-22 DIAGNOSIS — E119 Type 2 diabetes mellitus without complications: Secondary | ICD-10-CM | POA: Diagnosis not present

## 2018-03-22 DIAGNOSIS — I5020 Unspecified systolic (congestive) heart failure: Secondary | ICD-10-CM | POA: Diagnosis not present

## 2018-03-22 DIAGNOSIS — I110 Hypertensive heart disease with heart failure: Secondary | ICD-10-CM | POA: Diagnosis not present

## 2018-03-22 DIAGNOSIS — I2510 Atherosclerotic heart disease of native coronary artery without angina pectoris: Secondary | ICD-10-CM | POA: Diagnosis not present

## 2018-03-22 DIAGNOSIS — J189 Pneumonia, unspecified organism: Secondary | ICD-10-CM | POA: Diagnosis not present

## 2018-03-22 DIAGNOSIS — C50912 Malignant neoplasm of unspecified site of left female breast: Secondary | ICD-10-CM | POA: Diagnosis not present

## 2018-03-25 DIAGNOSIS — J189 Pneumonia, unspecified organism: Secondary | ICD-10-CM | POA: Diagnosis not present

## 2018-03-25 DIAGNOSIS — I110 Hypertensive heart disease with heart failure: Secondary | ICD-10-CM | POA: Diagnosis not present

## 2018-03-25 DIAGNOSIS — I2510 Atherosclerotic heart disease of native coronary artery without angina pectoris: Secondary | ICD-10-CM | POA: Diagnosis not present

## 2018-03-25 DIAGNOSIS — E119 Type 2 diabetes mellitus without complications: Secondary | ICD-10-CM | POA: Diagnosis not present

## 2018-03-25 DIAGNOSIS — C50912 Malignant neoplasm of unspecified site of left female breast: Secondary | ICD-10-CM | POA: Diagnosis not present

## 2018-03-25 DIAGNOSIS — I5020 Unspecified systolic (congestive) heart failure: Secondary | ICD-10-CM | POA: Diagnosis not present

## 2018-03-27 DIAGNOSIS — I110 Hypertensive heart disease with heart failure: Secondary | ICD-10-CM | POA: Diagnosis not present

## 2018-03-27 DIAGNOSIS — C50912 Malignant neoplasm of unspecified site of left female breast: Secondary | ICD-10-CM | POA: Diagnosis not present

## 2018-03-27 DIAGNOSIS — I2510 Atherosclerotic heart disease of native coronary artery without angina pectoris: Secondary | ICD-10-CM | POA: Diagnosis not present

## 2018-03-27 DIAGNOSIS — E119 Type 2 diabetes mellitus without complications: Secondary | ICD-10-CM | POA: Diagnosis not present

## 2018-03-27 DIAGNOSIS — I5020 Unspecified systolic (congestive) heart failure: Secondary | ICD-10-CM | POA: Diagnosis not present

## 2018-03-27 DIAGNOSIS — J189 Pneumonia, unspecified organism: Secondary | ICD-10-CM | POA: Diagnosis not present

## 2018-03-29 DIAGNOSIS — I110 Hypertensive heart disease with heart failure: Secondary | ICD-10-CM | POA: Diagnosis not present

## 2018-03-29 DIAGNOSIS — J189 Pneumonia, unspecified organism: Secondary | ICD-10-CM | POA: Diagnosis not present

## 2018-03-29 DIAGNOSIS — E119 Type 2 diabetes mellitus without complications: Secondary | ICD-10-CM | POA: Diagnosis not present

## 2018-03-29 DIAGNOSIS — I5020 Unspecified systolic (congestive) heart failure: Secondary | ICD-10-CM | POA: Diagnosis not present

## 2018-03-29 DIAGNOSIS — C50912 Malignant neoplasm of unspecified site of left female breast: Secondary | ICD-10-CM | POA: Diagnosis not present

## 2018-03-29 DIAGNOSIS — I2510 Atherosclerotic heart disease of native coronary artery without angina pectoris: Secondary | ICD-10-CM | POA: Diagnosis not present

## 2018-04-03 ENCOUNTER — Encounter (HOSPITAL_COMMUNITY): Payer: Self-pay | Admitting: Emergency Medicine

## 2018-04-03 ENCOUNTER — Inpatient Hospital Stay (HOSPITAL_COMMUNITY)
Admission: EM | Admit: 2018-04-03 | Discharge: 2018-04-06 | DRG: 690 | Disposition: A | Discharge status: Home Health | Payer: Medicare Other | Attending: Family Medicine | Admitting: Family Medicine

## 2018-04-03 DIAGNOSIS — Z7982 Long term (current) use of aspirin: Secondary | ICD-10-CM

## 2018-04-03 DIAGNOSIS — L97429 Non-pressure chronic ulcer of left heel and midfoot with unspecified severity: Secondary | ICD-10-CM | POA: Diagnosis not present

## 2018-04-03 DIAGNOSIS — I110 Hypertensive heart disease with heart failure: Secondary | ICD-10-CM | POA: Diagnosis present

## 2018-04-03 DIAGNOSIS — Z79899 Other long term (current) drug therapy: Secondary | ICD-10-CM

## 2018-04-03 DIAGNOSIS — N390 Urinary tract infection, site not specified: Secondary | ICD-10-CM | POA: Diagnosis not present

## 2018-04-03 DIAGNOSIS — E11621 Type 2 diabetes mellitus with foot ulcer: Secondary | ICD-10-CM | POA: Diagnosis present

## 2018-04-03 DIAGNOSIS — D509 Iron deficiency anemia, unspecified: Secondary | ICD-10-CM

## 2018-04-03 DIAGNOSIS — R410 Disorientation, unspecified: Secondary | ICD-10-CM | POA: Diagnosis not present

## 2018-04-03 DIAGNOSIS — Z86711 Personal history of pulmonary embolism: Secondary | ICD-10-CM

## 2018-04-03 DIAGNOSIS — M109 Gout, unspecified: Secondary | ICD-10-CM | POA: Diagnosis present

## 2018-04-03 DIAGNOSIS — N3000 Acute cystitis without hematuria: Principal | ICD-10-CM | POA: Diagnosis present

## 2018-04-03 DIAGNOSIS — Z853 Personal history of malignant neoplasm of breast: Secondary | ICD-10-CM

## 2018-04-03 DIAGNOSIS — Z66 Do not resuscitate: Secondary | ICD-10-CM | POA: Diagnosis present

## 2018-04-03 DIAGNOSIS — Z91048 Other nonmedicinal substance allergy status: Secondary | ICD-10-CM

## 2018-04-03 DIAGNOSIS — I5030 Unspecified diastolic (congestive) heart failure: Secondary | ICD-10-CM | POA: Diagnosis not present

## 2018-04-03 DIAGNOSIS — M79672 Pain in left foot: Secondary | ICD-10-CM | POA: Diagnosis present

## 2018-04-03 DIAGNOSIS — J9811 Atelectasis: Secondary | ICD-10-CM | POA: Diagnosis not present

## 2018-04-03 DIAGNOSIS — F4489 Other dissociative and conversion disorders: Secondary | ICD-10-CM | POA: Diagnosis not present

## 2018-04-03 DIAGNOSIS — I2510 Atherosclerotic heart disease of native coronary artery without angina pectoris: Secondary | ICD-10-CM | POA: Diagnosis present

## 2018-04-03 DIAGNOSIS — Z86718 Personal history of other venous thrombosis and embolism: Secondary | ICD-10-CM

## 2018-04-03 DIAGNOSIS — B9620 Unspecified Escherichia coli [E. coli] as the cause of diseases classified elsewhere: Secondary | ICD-10-CM | POA: Diagnosis present

## 2018-04-03 DIAGNOSIS — F05 Delirium due to known physiological condition: Secondary | ICD-10-CM

## 2018-04-03 DIAGNOSIS — Z7984 Long term (current) use of oral hypoglycemic drugs: Secondary | ICD-10-CM

## 2018-04-03 DIAGNOSIS — Z85038 Personal history of other malignant neoplasm of large intestine: Secondary | ICD-10-CM

## 2018-04-03 DIAGNOSIS — Z1623 Resistance to quinolones and fluoroquinolones: Secondary | ICD-10-CM | POA: Diagnosis present

## 2018-04-03 DIAGNOSIS — J45909 Unspecified asthma, uncomplicated: Secondary | ICD-10-CM | POA: Diagnosis present

## 2018-04-03 DIAGNOSIS — E1151 Type 2 diabetes mellitus with diabetic peripheral angiopathy without gangrene: Secondary | ICD-10-CM | POA: Diagnosis present

## 2018-04-03 DIAGNOSIS — Z888 Allergy status to other drugs, medicaments and biological substances status: Secondary | ICD-10-CM

## 2018-04-03 DIAGNOSIS — E785 Hyperlipidemia, unspecified: Secondary | ICD-10-CM | POA: Diagnosis present

## 2018-04-03 DIAGNOSIS — E876 Hypokalemia: Secondary | ICD-10-CM | POA: Diagnosis not present

## 2018-04-03 DIAGNOSIS — Z1629 Resistance to other single specified antibiotic: Secondary | ICD-10-CM | POA: Diagnosis present

## 2018-04-03 DIAGNOSIS — F17290 Nicotine dependence, other tobacco product, uncomplicated: Secondary | ICD-10-CM | POA: Diagnosis present

## 2018-04-03 DIAGNOSIS — F0390 Unspecified dementia without behavioral disturbance: Secondary | ICD-10-CM | POA: Diagnosis present

## 2018-04-03 LAB — URINALYSIS, ROUTINE W REFLEX MICROSCOPIC
Bacteria, UA: NONE SEEN
Bilirubin Urine: NEGATIVE
Glucose, UA: NEGATIVE mg/dL
Ketones, ur: 5 mg/dL — AB
Nitrite: POSITIVE — AB
PH: 6 (ref 5.0–8.0)
Protein, ur: 100 mg/dL — AB
SPECIFIC GRAVITY, URINE: 1.017 (ref 1.005–1.030)

## 2018-04-03 LAB — CBC
HCT: 32.8 % — ABNORMAL LOW (ref 36.0–46.0)
HEMOGLOBIN: 9.8 g/dL — AB (ref 12.0–15.0)
MCH: 21.7 pg — AB (ref 26.0–34.0)
MCHC: 29.9 g/dL — ABNORMAL LOW (ref 30.0–36.0)
MCV: 72.7 fL — AB (ref 78.0–100.0)
Platelets: 210 10*3/uL (ref 150–400)
RBC: 4.51 MIL/uL (ref 3.87–5.11)
RDW: 16 % — ABNORMAL HIGH (ref 11.5–15.5)
WBC: 3.9 10*3/uL — ABNORMAL LOW (ref 4.0–10.5)

## 2018-04-03 NOTE — ED Triage Notes (Signed)
Patient with altered mental status, confusion for the last two hour.  Patient has foul smelling urine per family.  Patient is usually coherent with daily tasks.

## 2018-04-04 ENCOUNTER — Emergency Department (HOSPITAL_COMMUNITY): Payer: Medicare Other

## 2018-04-04 DIAGNOSIS — E876 Hypokalemia: Secondary | ICD-10-CM | POA: Diagnosis not present

## 2018-04-04 DIAGNOSIS — N390 Urinary tract infection, site not specified: Secondary | ICD-10-CM | POA: Diagnosis not present

## 2018-04-04 DIAGNOSIS — N3000 Acute cystitis without hematuria: Secondary | ICD-10-CM | POA: Diagnosis present

## 2018-04-04 DIAGNOSIS — R410 Disorientation, unspecified: Secondary | ICD-10-CM | POA: Diagnosis not present

## 2018-04-04 DIAGNOSIS — J9811 Atelectasis: Secondary | ICD-10-CM | POA: Diagnosis not present

## 2018-04-04 DIAGNOSIS — D509 Iron deficiency anemia, unspecified: Secondary | ICD-10-CM

## 2018-04-04 LAB — COMPREHENSIVE METABOLIC PANEL WITH GFR
ALT: 6 U/L — ABNORMAL LOW (ref 14–54)
AST: 13 U/L — ABNORMAL LOW (ref 15–41)
Albumin: 3.1 g/dL — ABNORMAL LOW (ref 3.5–5.0)
Alkaline Phosphatase: 49 U/L (ref 38–126)
Anion gap: 8 (ref 5–15)
BUN: 11 mg/dL (ref 6–20)
CO2: 27 mmol/L (ref 22–32)
Calcium: 11 mg/dL — ABNORMAL HIGH (ref 8.9–10.3)
Chloride: 106 mmol/L (ref 101–111)
Creatinine, Ser: 0.91 mg/dL (ref 0.44–1.00)
GFR calc Af Amer: >60 mL/min
GFR calc non Af Amer: 53 mL/min — ABNORMAL LOW
Glucose, Bld: 154 mg/dL — ABNORMAL HIGH (ref 65–99)
Potassium: 2.9 mmol/L — ABNORMAL LOW (ref 3.5–5.1)
Sodium: 141 mmol/L (ref 135–145)
Total Bilirubin: 0.4 mg/dL (ref 0.3–1.2)
Total Protein: 7.4 g/dL (ref 6.5–8.1)

## 2018-04-04 LAB — BASIC METABOLIC PANEL
ANION GAP: 9 (ref 5–15)
BUN: 9 mg/dL (ref 6–20)
CALCIUM: 10.4 mg/dL — AB (ref 8.9–10.3)
CO2: 25 mmol/L (ref 22–32)
Chloride: 108 mmol/L (ref 101–111)
Creatinine, Ser: 0.88 mg/dL (ref 0.44–1.00)
GFR, EST NON AFRICAN AMERICAN: 55 mL/min — AB (ref >60)
Glucose, Bld: 131 mg/dL — ABNORMAL HIGH (ref 65–99)
POTASSIUM: 2.9 mmol/L — AB (ref 3.5–5.1)
Sodium: 142 mmol/L (ref 135–145)

## 2018-04-04 LAB — DIFFERENTIAL
Abs Immature Granulocytes: 0 10*3/uL (ref 0.0–0.1)
Basophils Absolute: 0 10*3/uL (ref 0.0–0.1)
Basophils Relative: 0 %
Eosinophils Absolute: 0.2 10*3/uL (ref 0.0–0.7)
Eosinophils Relative: 4 %
Immature Granulocytes: 0 %
Lymphocytes Relative: 46 %
Lymphs Abs: 1.8 10*3/uL (ref 0.7–4.0)
Monocytes Absolute: 0.3 10*3/uL (ref 0.1–1.0)
Monocytes Relative: 8 %
Neutro Abs: 1.7 10*3/uL (ref 1.7–7.7)
Neutrophils Relative %: 42 %

## 2018-04-04 LAB — CBC
HEMATOCRIT: 30.1 % — AB (ref 36.0–46.0)
Hemoglobin: 9 g/dL — ABNORMAL LOW (ref 12.0–15.0)
MCH: 22 pg — ABNORMAL LOW (ref 26.0–34.0)
MCHC: 29.9 g/dL — ABNORMAL LOW (ref 30.0–36.0)
MCV: 73.4 fL — ABNORMAL LOW (ref 78.0–100.0)
PLATELETS: ADEQUATE 10*3/uL (ref 150–400)
RBC: 4.1 MIL/uL (ref 3.87–5.11)
RDW: 16.3 % — AB (ref 11.5–15.5)
WBC: 4 10*3/uL (ref 4.0–10.5)

## 2018-04-04 LAB — GLUCOSE, CAPILLARY
Glucose-Capillary: 164 mg/dL — ABNORMAL HIGH (ref 65–99)
Glucose-Capillary: 175 mg/dL — ABNORMAL HIGH (ref 65–99)

## 2018-04-04 LAB — MAGNESIUM: MAGNESIUM: 1.6 mg/dL — AB (ref 1.7–2.4)

## 2018-04-04 LAB — I-STAT CG4 LACTIC ACID, ED: Lactic Acid, Venous: 1.61 mmol/L (ref 0.5–1.9)

## 2018-04-04 LAB — CBG MONITORING, ED
Glucose-Capillary: 118 mg/dL — ABNORMAL HIGH (ref 65–99)
Glucose-Capillary: 149 mg/dL — ABNORMAL HIGH (ref 65–99)

## 2018-04-04 MED ORDER — LOSARTAN POTASSIUM 50 MG PO TABS
50.0000 mg | ORAL_TABLET | Freq: Every day | ORAL | Status: DC
  Administered 2018-04-04 – 2018-04-06 (×3): 50 mg via ORAL
  Filled 2018-04-04 (×3): qty 1

## 2018-04-04 MED ORDER — INSULIN ASPART 100 UNIT/ML ~~LOC~~ SOLN: 0.0000 [IU] | Freq: Every day | SUBCUTANEOUS | Status: DC

## 2018-04-04 MED ORDER — SODIUM CHLORIDE 0.9 % IV SOLN
INTRAVENOUS | Status: AC
  Administered 2018-04-04 (×2): via INTRAVENOUS

## 2018-04-04 MED ORDER — NICOTINE 7 MG/24HR TD PT24: 7.0000 mg | MEDICATED_PATCH | Freq: Every day | TRANSDERMAL | Status: DC

## 2018-04-04 MED ORDER — POTASSIUM CHLORIDE CRYS ER 20 MEQ PO TBCR: 40.0000 meq | EXTENDED_RELEASE_TABLET | Freq: Two times a day (BID) | ORAL | Status: DC

## 2018-04-04 MED ORDER — POTASSIUM CHLORIDE 10 MEQ/100ML IV SOLN
10.0000 meq | Freq: Once | INTRAVENOUS | Status: DC
  Filled 2018-04-04: qty 100

## 2018-04-04 MED ORDER — ACETAMINOPHEN 325 MG PO TABS
650.0000 mg | ORAL_TABLET | Freq: Four times a day (QID) | ORAL | Status: DC | PRN
Start: 2018-04-04 — End: 2018-04-06

## 2018-04-04 MED ORDER — POTASSIUM CHLORIDE 10 MEQ/100ML IV SOLN
10.0000 meq | INTRAVENOUS | Status: AC
  Administered 2018-04-04 (×2): 10 meq via INTRAVENOUS
  Filled 2018-04-04 (×2): qty 100

## 2018-04-04 MED ORDER — ENOXAPARIN SODIUM 30 MG/0.3ML ~~LOC~~ SOLN
30.0000 mg | Freq: Every day | SUBCUTANEOUS | Status: DC
  Administered 2018-04-04 – 2018-04-06 (×3): 30 mg via SUBCUTANEOUS
  Filled 2018-04-04 (×3): qty 0.3

## 2018-04-04 MED ORDER — POLYETHYLENE GLYCOL 3350 17 G PO PACK
17.0000 g | PACK | Freq: Two times a day (BID) | ORAL | Status: DC
  Administered 2018-04-04 – 2018-04-06 (×4): 17 g via ORAL
  Filled 2018-04-04 (×4): qty 1

## 2018-04-04 MED ORDER — SODIUM CHLORIDE 0.9 % IV SOLN
1.0000 g | INTRAVENOUS | Status: DC
  Filled 2018-04-04: qty 10

## 2018-04-04 MED ORDER — MAGNESIUM SULFATE 2 GM/50ML IV SOLN
2.0000 g | Freq: Once | INTRAVENOUS | Status: AC
  Administered 2018-04-04: 2 g via INTRAVENOUS
  Filled 2018-04-04: qty 50

## 2018-04-04 MED ORDER — INSULIN ASPART 100 UNIT/ML ~~LOC~~ SOLN
0.0000 [IU] | Freq: Three times a day (TID) | SUBCUTANEOUS | Status: DC
  Administered 2018-04-04: 1 [IU] via SUBCUTANEOUS
  Administered 2018-04-04 – 2018-04-05 (×2): 2 [IU] via SUBCUTANEOUS
  Administered 2018-04-05: 1 [IU] via SUBCUTANEOUS
  Administered 2018-04-05: 2 [IU] via SUBCUTANEOUS
  Administered 2018-04-06 (×2): 1 [IU] via SUBCUTANEOUS
  Filled 2018-04-04: qty 1

## 2018-04-04 MED ORDER — SENNOSIDES-DOCUSATE SODIUM 8.6-50 MG PO TABS
1.0000 | ORAL_TABLET | Freq: Two times a day (BID) | ORAL | Status: DC
  Administered 2018-04-04 – 2018-04-06 (×4): 1 via ORAL
  Filled 2018-04-04 (×4): qty 1

## 2018-04-04 MED ORDER — POTASSIUM CHLORIDE CRYS ER 20 MEQ PO TBCR
40.0000 meq | EXTENDED_RELEASE_TABLET | Freq: Once | ORAL | Status: DC
  Filled 2018-04-04: qty 2

## 2018-04-04 MED ORDER — AMLODIPINE BESYLATE 5 MG PO TABS
5.0000 mg | ORAL_TABLET | Freq: Every day | ORAL | Status: DC
  Administered 2018-04-04 – 2018-04-06 (×3): 5 mg via ORAL
  Filled 2018-04-04 (×3): qty 1

## 2018-04-04 MED ORDER — ROSUVASTATIN CALCIUM 10 MG PO TABS
10.0000 mg | ORAL_TABLET | Freq: Every day | ORAL | Status: DC
  Administered 2018-04-04 – 2018-04-06 (×3): 10 mg via ORAL
  Filled 2018-04-04 (×4): qty 1

## 2018-04-04 MED ORDER — TRAMADOL HCL 50 MG PO TABS: 50.0000 mg | ORAL_TABLET | Freq: Three times a day (TID) | ORAL | Status: DC | PRN

## 2018-04-04 MED ORDER — NICOTINE 14 MG/24HR TD PT24
14.0000 mg | MEDICATED_PATCH | Freq: Every day | TRANSDERMAL | Status: DC
  Administered 2018-04-05 – 2018-04-06 (×2): 14 mg via TRANSDERMAL
  Filled 2018-04-04 (×2): qty 1

## 2018-04-04 MED ORDER — ACETAMINOPHEN 650 MG RE SUPP
650.0000 mg | Freq: Four times a day (QID) | RECTAL | Status: DC | PRN
Start: 2018-04-04 — End: 2018-04-06

## 2018-04-04 MED ORDER — CARVEDILOL 12.5 MG PO TABS
12.5000 mg | ORAL_TABLET | Freq: Two times a day (BID) | ORAL | Status: DC
Start: 2018-04-04 — End: 2018-04-06
  Administered 2018-04-04 – 2018-04-06 (×5): 12.5 mg via ORAL
  Filled 2018-04-04 (×5): qty 1

## 2018-04-04 MED ORDER — POLYETHYLENE GLYCOL 3350 17 G PO PACK: 17.0000 g | PACK | Freq: Every day | ORAL | Status: DC | PRN

## 2018-04-04 MED ORDER — POTASSIUM CHLORIDE 10 MEQ/100ML IV SOLN
10.0000 meq | Freq: Once | INTRAVENOUS | Status: AC
  Administered 2018-04-04: 10 meq via INTRAVENOUS
  Filled 2018-04-04: qty 100

## 2018-04-04 MED ORDER — SODIUM CHLORIDE 0.9 % IV SOLN
2.0000 g | INTRAVENOUS | Status: DC
  Administered 2018-04-04: 2 g via INTRAVENOUS
  Filled 2018-04-04: qty 20

## 2018-04-04 NOTE — ED Provider Notes (Signed)
[facility] Provider Note   CSN: [id number] Arrival date & time: 04/03/18  2254     History   Chief Complaint Chief Complaint  Patient presents with  . Altered Mental Status    HPI [name] is a 84 y.o. female.  The history is provided by the patient and a relative. The history is limited by the condition of the patient (Altered mental status).  She has history of hypertension, hyperlipidemia, hypercalcemia, diabetes, breast cancer, heart failure and was brought in by family because of confusion.  Family noted that she was confused tonight.  She has had a cough productive of clear sputum.  There was no known fever or chills or sweats.  There is no nausea or vomiting.  She denies dysuria or urinary frequency urgency, but family has noted urine is foul-smelling.  Past Medical History:  Diagnosis Date  . Allergy   . Anemia    Iron def anemia  . Arthritis   . Asthma   . Breast cancer ([place]) 10/2010   Invasive Ductal Carcinoma, s/p bilateral mastectomy, now on Tamoxifen. Followed by Dr Jamse Arn.   . Breast cancer ([place]) 2011   s/p Bilateral masectomy  . CAD (coronary artery disease)   . Carotid artery aneurysm ([place]) 08/2010   right ICA, 5 x 73mm  . CHF (congestive heart failure) (HCC)    Systolic   . Colon cancer ([place])   . Colon cancer ([place]) 12/11/2013  . DDD (degenerative disc disease), cervical   . Diabetes mellitus   . Diverticulosis   . GERD (gastroesophageal reflux disease)   . H/O: GI bleed   . Hiatal hernia   . History of colon cancer   . Hypercalcemia 06/09/2014  . Hyperlipidemia   . Hypertension   . Myocardial infarction ([place])   . Neuropathy   . Pneumonia 03/2012  . Shortness of breath     Patient Active Problem List   Diagnosis Date Noted  . Swelling of left hand 12/10/2017  . Cough in adult   . Left knee pain 08/09/2017  . Protein-calorie malnutrition, severe 02/06/2017  . Hypokalemia 10/13/2016  . Pressure  injury of skin 08/31/2016  . Pulmonary embolus ([place]) 08/30/2016  . Mild dementia 06/01/2016  . Depression 01/20/2016  . Suicidal ideation 01/19/2016  . Prolapse of female pelvic organs 12/28/2015  . Leg weakness, bilateral 12/08/2015  . Weakness of right lower extremity 11/25/2015  . Dry eyes 10/28/2015  . (HFpEF) heart failure with preserved ejection fraction ([place])   . CAD in native artery   . Gout 05/14/2015  . Dyslipidemia   . Essential hypertension   . Seasonal allergies 02/10/2015  . Stroke-like symptoms   . Stroke-like episode ([place]) 01/25/2015  . Syncope and collapse 01/25/2015  . Dizziness 09/19/2014  . Anemia of chronic illness 08/19/2014  . Acute superficial venous thrombosis of right lower extremity 08/19/2014  . Hypercalcemia 06/09/2014  . Breast cancer, left breast ([place]) 06/09/2014  . Breast cancer, right breast ([place]) 06/09/2014  . Edema of left lower extremity 03/30/2014  . PAD (peripheral artery disease) ([place]) 03/30/2014  . History of colon cancer 12/11/2013  . History of fall 05/15/2013  . Bilateral leg pain 03/07/2013  . Lower extremity edema 02/19/2013  . Debility 01/22/2013  . Stricture and stenosis of esophagus 10/27/2012  . Constipation 06/24/2012  . Thoracic back pain 05/24/2012  . Tobacco abuse 06/02/2011  . History of breast cancer in female, bilateral.  Mastectomies 10/24/2010. 04/26/2011  .  Hiatal hernia 03/22/2011  . Colon polyp 03/22/2011  . Poor circulation 03/22/2011  . Cough 01/31/2011  . 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 Neuropathy ([place]) 08/25/2009  . ANEMIA-IRON DEFICIENCY 04/29/2007  . HLD (hyperlipidemia) 01/29/2007  . Controlled type 2 diabetes mellitus without complication, without long-term current use of insulin ([place]) 01/10/2007  . HYPERTENSION, BENIGN SYSTEMIC 01/10/2007  . Coronary atherosclerosis 01/10/2007  . Chronic systolic heart failure ([place]) 01/10/2007  . Asthma 01/10/2007  . Reflux esophagitis 01/10/2007  .  Osteoarthritis, multiple sites 01/10/2007    Past Surgical History:  Procedure Laterality Date  . Breast masectomy  10/2010   Bilateral masectomy by Dr Lucia Gaskins s/p invasive ductal carcinoma.  Marland Kitchen BREAST SURGERY    . Cardiac  Catherization  2007   Severe 3-vessel disease.  EF 20-25%.  . ESOPHAGOGASTRODUODENOSCOPY  2001   Esophageal Tear  . ESOPHAGOGASTRODUODENOSCOPY  10/27/2012   Procedure: ESOPHAGOGASTRODUODENOSCOPY (EGD);  Surgeon: Irene Shipper, MD;  Location: [facility] ENDOSCOPY;  Service: Endoscopy;  Laterality: N/A;  pat  . JOINT REPLACEMENT Right 2001   Knee  . TOTAL KNEE ARTHROPLASTY  2001     OB History   None      Home Medications    Prior to Admission medications   Medication Sig Start Date End Date Taking? Authorizing Provider  acetaminophen (TYLENOL) 500 MG tablet Take 500 mg by mouth every 6 (six) hours as needed for moderate pain.     [provider]  albuterol (PROVENTIL HFA;VENTOLIN HFA) 108 (90 BASE) MCG/ACT inhaler Inhale 2 puffs into the lungs every 6 (six) hours as needed. For shortness of breath Patient not taking: Reported on 03/11/2018 01/18/15   [name], MD  Amino Acids-Protein Hydrolys (FEEDING SUPPLEMENT, PRO-STAT SUGAR FREE 64,) LIQD Take 30 mLs by mouth 2 (two) times daily with a meal. ensure    [provider]  amLODipine (NORVASC) 5 MG tablet Take 1 tablet (5 mg total) by mouth daily. 12/21/17   [place] Bing, DO  aspirin (ASPIRIN CHILDRENS) 81 MG chewable tablet Chew 81 mg by mouth daily.     [provider]  azithromycin (ZITHROMAX) 250 MG tablet Take 1 tablet every day until finished. Patient not taking: Reported on 03/11/2018 02/18/18   Isla Pence, MD  capsaicin (ZOSTRIX) 0.025 % cream APPLY TO AFFECTED AREA TWICE A DAY Patient not taking: Reported on 03/11/2018 04/11/16   [name], MD  carvedilol (COREG) 12.5 MG tablet TAKE 1 TABLET BY MOUTH TWICE A DAY WITH A MEAL Patient taking differently: Take 12.5 mg by  mouth two times a day with a meal 10/25/17   [place] Bing, DO  ferrous sulfate 325 (65 FE) MG tablet Take 1 tablet (325 mg total) by mouth daily with breakfast. Patient not taking: Reported on 03/11/2018 11/21/16   [name], MD  gabapentin (NEURONTIN) 300 MG capsule Take 300 mg by mouth daily.    [provider]  guaifenesin (ROBITUSSIN) 100 MG/5ML syrup Take 10 mLs (200 mg total) by mouth 3 (three) times daily as needed for cough. 08/15/16   [name], MD  hydroxypropyl methylcellulose / hypromellose (ISOPTO TEARS / GONIOVISC) 2.5 % ophthalmic solution Place 1 drop 3 (three) times daily into both eyes. Patient not taking: Reported on 03/11/2018 09/28/17   [place] Bing, DO  loratadine (CLARITIN) 10 MG tablet TAKE 1/2 TABLET BY MOUTH DAILY Patient not taking: Reported on 03/11/2018 12/08/15   [name], MD  losartan (COZAAR) 50 MG  tablet TAKE 1 TABLET (50 MG TOTAL) BY MOUTH AT BEDTIME. 12/21/17   [place] Bing, DO  metFORMIN (GLUCOPHAGE) 500 MG tablet Take 1 tablet (500 mg total) by mouth daily with breakfast. 07/31/17   [place] Bing, DO  PATADAY 0.2 % SOLN APPLY 1 DROP TO EYE DAILY. Patient not taking: Reported on 03/11/2018 11/23/15   [name], MD  petrolatum-hydrophilic-aloe vera (ALOE VESTA) ointment Apply topically 2 (two) times daily as needed for wound care. Patient not taking: Reported on 03/11/2018 05/10/17   Ok Edwards, PA-C  ranitidine (ZANTAC) 150 MG tablet TAKE 1 TABLET BY MOUTH TWICE A DAY Patient taking differently: 150MG  BY MOUTH TWICE DAILY 10/15/17   [place] Bing, DO  rosuvastatin (CRESTOR) 20 MG tablet TAKE 1 TABLET BY MOUTH EVERYDAY AT BEDTIME 01/18/18   [place] Bing, DO  senna-docusate (SENOKOT-S) 8.6-50 MG tablet Take 2 tablets by mouth at bedtime. Patient taking differently: Take 1 tablet by mouth every other day.  04/23/17   Virginia Crews, MD  silver sulfADIAZINE (SILVADENE) 1 % cream Apply 1 application topically  daily. Patient not taking: Reported on 03/11/2018 05/15/17   Gean Birchwood, DPM  traMADol (ULTRAM) 50 MG tablet Take 1 tablet (50 mg total) by mouth every 8 (eight) hours as needed for severe pain. 12/03/17   [place] Dawn, MD  cetirizine (ZYRTEC) 5 MG tablet Take 1 tablet (5 mg total) by mouth daily. Take at night time as it can cause some drowsiness 05/24/12 07/09/12  Losq, Burnell Blanks, MD    Family History Family History  Problem Relation Age of Onset  . Sickle cell anemia Other   . Heart disease Mother   . Heart disease Father   . Cancer Daughter 27       breast ca  . Hypertension Daughter   . Cancer Daughter 9       breast ca  . Hypertension Daughter   . Heart disease Son        Poor circulation-Left Leg  . Diabetes Daughter   . Hypertension Daughter   . Hyperlipidemia Daughter        Poor circulation- Toe amputation    Social History Social History   Tobacco Use  . Smoking status: Never Smoker  . Smokeless tobacco: Current User    Types: Snuff  Substance Use Topics  . Alcohol use: No  . Drug use: No     Allergies   Tape and Lisinopril   Review of Systems Review of Systems  Unable to perform ROS: Mental status change     Physical Exam Updated Vital Signs BP (!) 164/55 (BP Location: Right Arm)   Pulse 71   Temp 98.4 F (36.9 C) (Oral)   Resp 16   SpO2 98%   Physical Exam  Nursing note and vitals reviewed.  84 year old female, resting comfortably and in no acute distress. Vital signs are significant for elevated systolic blood pressure. Oxygen saturation is 98%, which is normal. Head is normocephalic and atraumatic. PERRLA, EOMI. Oropharynx is clear. Neck is nontender and supple without adenopathy or JVD. Back is nontender and there is no CVA tenderness. Lungs are clear without rales, wheezes, or rhonchi. Chest is nontender. Heart has regular rate and rhythm without murmur. Abdomen is soft, flat, nontender without masses or hepatosplenomegaly  and peristalsis is normoactive. Extremities have no cyanosis or edema, full range of motion is present. Skin is warm and dry without rash. Neurologic: Awake and alert and  oriented to person and place but not time (knows month and date but not year), cranial nerves are intact, there are no motor or sensory deficits.  ED Treatments / Results  Labs (all labs ordered are listed, but only abnormal results are displayed) Labs Reviewed  COMPREHENSIVE METABOLIC PANEL - Abnormal; Notable for the following components:      Result Value   Potassium 2.9 (*)    Glucose, Bld 154 (*)    Calcium 11.0 (*)    Albumin 3.1 (*)    AST 13 (*)    ALT 6 (*)    GFR calc non Af Amer 53 (*)    All other components within normal limits  CBC - Abnormal; Notable for the following components:   WBC 3.9 (*)    Hemoglobin 9.8 (*)    HCT 32.8 (*)    MCV 72.7 (*)    MCH 21.7 (*)    MCHC 29.9 (*)    RDW 16.0 (*)    All other components within normal limits  URINALYSIS, ROUTINE W REFLEX MICROSCOPIC - Abnormal; Notable for the following components:   APPearance CLOUDY (*)    Hgb urine dipstick SMALL (*)    Ketones, ur 5 (*)    Protein, ur 100 (*)    Nitrite POSITIVE (*)    Leukocytes, UA SMALL (*)    WBC, UA >50 (*)    All other components within normal limits  MAGNESIUM - Abnormal; Notable for the following components:   Magnesium 1.6 (*)    All other components within normal limits  CBC - Abnormal; Notable for the following components:   Hemoglobin 9.0 (*)    HCT 30.1 (*)    MCV 73.4 (*)    MCH 22.0 (*)    MCHC 29.9 (*)    RDW 16.3 (*)    All other components within normal limits  BASIC METABOLIC PANEL - Abnormal; Notable for the following components:   Potassium 2.9 (*)    Glucose, Bld 131 (*)    Calcium 10.4 (*)    GFR calc non Af Amer 55 (*)    All other components within normal limits  CULTURE, BLOOD (ROUTINE X 2)  CULTURE, BLOOD (ROUTINE X 2)  URINE CULTURE  DIFFERENTIAL  CBG MONITORING,  ED  I-STAT CG4 LACTIC ACID, ED    Radiology Dg Chest Port 1 View  Result Date: 04/04/2018 CLINICAL DATA:  Altered mental status.  Confusion. EXAM: PORTABLE CHEST 1 VIEW COMPARISON:  Radiographs 02/18/2018 FINDINGS: The cardiomediastinal contours are unchanged, mild cardiomegaly stable. Streaky bibasilar opacities are similar to prior exam and likely atelectasis. Pulmonary vasculature is normal. No consolidation, pleural effusion, or pneumothorax. No acute osseous abnormalities are seen. IMPRESSION: Bibasilar atelectasis or scarring.  No acute findings. Electronically Signed   By: Jeb Levering M.D.   On: 04/04/2018 01:44    Procedures Procedures  Medications Ordered in ED Medications  cefTRIAXone (ROCEPHIN) 2 g in sodium chloride 0.9 % 100 mL IVPB (has no administration in time range)     Initial Impression / Assessment and Plan / ED Course  I have reviewed the triage vital signs and the nursing notes.  Pertinent labs & imaging results that were available during my care of the patient were reviewed by me and considered in my medical decision making (see chart for details).  Altered mental status.  Laboratory work-up shows evidence of UTI, which is most likely cause for mental status change.  Also, she has mild hypercalcemia which  is chronic and unchanged.  Significant hypokalemia is noted and she is given oral and intravenous potassium.  This has also been present on multiple prior lab draws.  Chronic anemia is present and unchanged from baseline.  Urine is sent for culture.  Sepsis screen is done with lactic acid level.  She is started on antibiotics for UTI - ceftriaxone.  Case is discussed with Dr. Yisroel Ramming of family practice service who agrees to admit the patient.  Final Clinical Impressions(s) / ED Diagnoses   Final diagnoses:  Urinary tract infection without hematuria, site unspecified  Disorientation  Hypercalcemia  Hypokalemia  Microcytic anemia    ED Discharge Orders     None       Delora Fuel, MD 05/39[birth year] [phone]

## 2018-04-04 NOTE — Progress Notes (Signed)
Family medicine progress note update  Subjective: Awake and alert. Says that she recognized me from earlier admission in January. Very pleasant, no complaints.  Exam: General: no acute distress, resting comfortably Well developed, NAD. Elderly African [place] female HEENT: [place], AT, MMM, [place], eomi CV: rrr, no m/r/g, pt/dp palpable bilaterally Lungs: CTAB, normal wOB Abdomen: soft, non-tender, non-distended, normoactive bowel sounds Psych: appropriate Neuro: no focal neuro deficits, 5/5 strength BUE, BLE  Assessment: 84 year old who presents with mild AMS in setting of UA with nitrites. Doing well on CTX. Received 2g initially, will decrease to 1g for uti dosing. Patient with hypertension. Losartan held overnight. Will start this medication.  Plan  UTI Continue CTX until urine cultures and sensitivities result  Hypertension Continue coreg 12.5mg  bid Continue norvasc 5mg  daily Restarting losartan 50mg  daily   Rest of care per Dr. Blair Promise H&P

## 2018-04-04 NOTE — Progress Notes (Signed)
On call resident number paged, patient's blood pressure is elevated and she states she has not had a bowel movement in 9 days.

## 2018-04-04 NOTE — ED Notes (Signed)
Pt demanded IV come out due to hurting, This RN check the IV and can  Not find any thing wrong with it.

## 2018-04-04 NOTE — ED Notes (Signed)
Nurse starting IV and getting 2nd set of blood cultures.

## 2018-04-04 NOTE — ED Notes (Signed)
Pt daughter in law [name] [phone], call when released for pick up

## 2018-04-04 NOTE — H&P (Signed)
[facility] Admission History and Physical Service Pager: [phone]  [name] Medical record number: [id number] Date of birth: 06/01/1924 Age: 84 y.o. Gender: female  Primary Care Provider: [place] Bing, DO Consultants: PT/OT Code Status: DNR (confimred on admission)  Chief Complaint: Altered mental status  Assessment and Plan: [name] is a 84 y.o. female presenting with AMS in the setting of acute cystitis. PMH is significant for HTN, NIDDM, HFpEF, h/o b/l-breast cancer and colon cancer.  1.  Altered mental status  Acute cystitis: Acute.  UA positive for nitrites and small leukocytes with  >50 WBCs and no hemoglobin.  Has a history of E. coli resistant to Cipro and Bactrim as of 11/2017.  Received dose of ceftriaxone in the ED. Patient did not have signs of confusion based on my exam and was alert and oriented to place and person along with situation.  She did not have any symptoms of urinary infection.   - Admit to MedSurg, attending Dr. Chrisandra Netters - Ceftriaxone, day 1 - Awaiting urine cultures and blood cultures - Monitor mentation - PT/OT consulted  2.  Hypercalcemia  Hypokalemia: Chronic.  Corrected calcium 11.36 upon arrival.  Seems to be ongoing issue over the last year. - Replete potassium and monitor - Continue half maintenance IVF for 12 hours  3.  Hypertension  Hyperlipidemia: Chronic.  Presented in the 160s/50s.  Home medications include amlodipine, carvedilol, losartan.  Also on statin and daily aspirin. - Continue home amlodipine 5 mg daily and carvedilol 12.5 mg twice daily - Holding home losartan 50 mg daily  4.  HFpEF: Chronic.  Last echo with EF 50-55% without wall motion abnormalities and G1DD.  Patient takes carvedilol. - Monitor fluid status with strict I&O's and daily weights  5.  Non-insulin-dependent diabetes mellitus: Chronic.  Adequately controlled.  A1c 6.7 as of 11/2017.  On metformin  only. - Holding home metformin - Sensitive sliding scale  6.  History of bilateral breast cancer and colon cancer: Chronic.  Has been stable. - Monitor outpatient  FEN/GI: Regular diet Prophylaxis: Lovenox  Disposition: Pending improvement of altered mental status in the setting of acute cystitis.  Anticipate discharge likely home in the next 24-48 hours.  History of Present Illness:  [name] is a 84 y.o. female presenting with AMS in the setting of acute cystitis. PMH is significant for HTN, NIDDM, HFpEF, h/o b/l-breast cancer and colon cancer.  Patient was in her normal state of health but developed with the family reported as altered mental status as compared to her baseline for the last 24 hours.  Mentation appeared to be confused when family brought patient into the [facility] ED.  She was alert and oriented to self and place but not to date.  Patient denied history of fevers or chills, cough, shortness of breath, chest pain, abdominal pain, vomiting or diarrhea, dysuria or hematuria.  Family did report urine smelt foul and they were concerned she may have a urinary infection.  Upon arrival to ED, BP 164/55 otherwise stable without fever. Pertinent labs include urinalysis with cloudy appearance, positive for nitrates and small leukocytes >50 WBC, 100 protein, 5 ketones.  CBC with WBC 3.9 and stable baseline hemoglobin of 9.8.  CMET significant for calcium 11.4, hypokalemia 2.9 with baseline creatinine 0.91.  Patient did not appear to have any signs of pyelonephritis or systemic involvement.  Blood cultures were obtained and Rocephin was initiated based on prior urine cultures.  FPTS consulted for admission.  Review Of Systems: Per HPI, otherwise the remainder of the systems were negative.    Review of Systems  Constitutional: Negative for chills, fever and malaise/fatigue.  HENT: Negative for congestion and sore throat.   Eyes: Negative for blurred vision and double vision.  Respiratory:  Negative for cough, sputum production and shortness of breath.   Cardiovascular: Negative for chest pain and leg swelling.  Gastrointestinal: Negative for diarrhea, nausea and vomiting.  Genitourinary: Negative for dysuria, frequency, hematuria and urgency.  Musculoskeletal: Negative for myalgias and neck pain.  Skin: Negative for itching and rash.  Neurological: Negative for weakness and headaches.  Psychiatric/Behavioral: Negative for substance abuse. The patient is not nervous/anxious.     Patient Active Problem List   Diagnosis Date Noted  . Swelling of left hand 12/10/2017  . Cough in adult   . Left knee pain 08/09/2017  . Protein-calorie malnutrition, severe 02/06/2017  . Hypokalemia 10/13/2016  . Pressure injury of skin 08/31/2016  . Pulmonary embolus ([place]) 08/30/2016  . Mild dementia 06/01/2016  . Depression 01/20/2016  . Suicidal ideation 01/19/2016  . Prolapse of female pelvic organs 12/28/2015  . Leg weakness, bilateral 12/08/2015  . Weakness of right lower extremity 11/25/2015  . Dry eyes 10/28/2015  . (HFpEF) heart failure with preserved ejection fraction ([place])   . CAD in native artery   . Gout 05/14/2015  . Dyslipidemia   . Essential hypertension   . Seasonal allergies 02/10/2015  . Stroke-like symptoms   . Stroke-like episode ([place]) 01/25/2015  . Syncope and collapse 01/25/2015  . Dizziness 09/19/2014  . Anemia of chronic illness 08/19/2014  . Acute superficial venous thrombosis of right lower extremity 08/19/2014  . Hypercalcemia 06/09/2014  . Breast cancer, left breast ([place]) 06/09/2014  . Breast cancer, right breast ([place]) 06/09/2014  . Edema of left lower extremity 03/30/2014  . PAD (peripheral artery disease) ([place]) 03/30/2014  . History of colon cancer 12/11/2013  . History of fall 05/15/2013  . Bilateral leg pain 03/07/2013  . Lower extremity edema 02/19/2013  . Debility 01/22/2013  . Stricture and stenosis of esophagus 10/27/2012  . Constipation  06/24/2012  . Thoracic back pain 05/24/2012  . Tobacco abuse 06/02/2011  . History of breast cancer in female, bilateral.  Mastectomies 10/24/2010. 04/26/2011  . Hiatal hernia 03/22/2011  . Colon polyp 03/22/2011  . Poor circulation 03/22/2011  . Cough 01/31/2011  . 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 Neuropathy ([place]) 08/25/2009  . ANEMIA-IRON DEFICIENCY 04/29/2007  . HLD (hyperlipidemia) 01/29/2007  . Controlled type 2 diabetes mellitus without complication, without long-term current use of insulin ([place]) 01/10/2007  . HYPERTENSION, BENIGN SYSTEMIC 01/10/2007  . Coronary atherosclerosis 01/10/2007  . Chronic systolic heart failure ([place]) 01/10/2007  . Asthma 01/10/2007  . Reflux esophagitis 01/10/2007  . Osteoarthritis, multiple sites 01/10/2007    Past Medical History: Past Medical History:  Diagnosis Date  . Allergy   . Anemia    Iron def anemia  . Arthritis   . Asthma   . Breast cancer ([place]) 10/2010   Invasive Ductal Carcinoma, s/p bilateral mastectomy, now on Tamoxifen. Followed by Dr Jamse Arn.   . Breast cancer ([place]) 2011   s/p Bilateral masectomy  . CAD (coronary artery disease)   . Carotid artery aneurysm ([place]) 08/2010   right ICA, 5 x 45mm  . CHF (congestive heart failure) (H[facility])    Systolic   . Colon cancer ([place])   . Colon cancer ([place]) 12/11/2013  . DDD (  degenerative disc disease), cervical   . Diabetes mellitus   . Diverticulosis   . GERD (gastroesophageal reflux disease)   . H/O: GI bleed   . Hiatal hernia   . History of colon cancer   . Hypercalcemia 06/09/2014  . Hyperlipidemia   . Hypertension   . Myocardial infarction ([place])   . Neuropathy   . Pneumonia 03/2012  . Shortness of breath     Past Surgical History: Past Surgical History:  Procedure Laterality Date  . Breast masectomy  10/2010   Bilateral masectomy by Dr Lucia Gaskins s/p invasive ductal carcinoma.  Marland Kitchen BREAST SURGERY    . Cardiac  Catherization  2007   Severe 3-vessel disease.  EF  20-25%.  . ESOPHAGOGASTRODUODENOSCOPY  2001   Esophageal Tear  . ESOPHAGOGASTRODUODENOSCOPY  10/27/2012   Procedure: ESOPHAGOGASTRODUODENOSCOPY (EGD);  Surgeon: Irene Shipper, MD;  Location: [facility] ENDOSCOPY;  Service: Endoscopy;  Laterality: N/A;  pat  . JOINT REPLACEMENT Right 2001   Knee  . TOTAL KNEE ARTHROPLASTY  2001    Social History: Social History   Tobacco Use  . Smoking status: Never Smoker  . Smokeless tobacco: Current User    Types: Snuff  Substance Use Topics  . Alcohol use: No  . Drug use: No   Additional social history: Widowed.  She lives with her son and has 2 daughters who help with her care.  She has a large extended family in the area who are very involved. Please also refer to relevant sections of EMR.  Family History: Family History  Problem Relation Age of Onset  . Sickle cell anemia Other   . Heart disease Mother   . Heart disease Father   . Cancer Daughter 71       breast ca  . Hypertension Daughter   . Cancer Daughter 75       breast ca  . Hypertension Daughter   . Heart disease Son        Poor circulation-Left Leg  . Diabetes Daughter   . Hypertension Daughter   . Hyperlipidemia Daughter        Poor circulation- Toe amputation    Allergies and Medications: Allergies  Allergen Reactions  . Tape Other (See Comments)    Skin is very thin and will tear and bruise easily!!  . Lisinopril Cough   No current facility-administered medications on file prior to encounter.    Current Outpatient Medications on File Prior to Encounter  Medication Sig Dispense Refill  . acetaminophen (TYLENOL) 500 MG tablet Take 500 mg by mouth every 6 (six) hours as needed for moderate pain.     Marland Kitchen albuterol (PROVENTIL HFA;VENTOLIN HFA) 108 (90 BASE) MCG/ACT inhaler Inhale 2 puffs into the lungs every 6 (six) hours as needed. For shortness of breath (Patient not taking: Reported on 03/11/2018) 1 Inhaler 6  . Amino Acids-Protein Hydrolys (FEEDING SUPPLEMENT, PRO-STAT  SUGAR FREE 64,) LIQD Take 30 mLs by mouth 2 (two) times daily with a meal. ensure    . amLODipine (NORVASC) 5 MG tablet Take 1 tablet (5 mg total) by mouth daily. 90 tablet 6  . aspirin (ASPIRIN CHILDRENS) 81 MG chewable tablet Chew 81 mg by mouth daily.     Marland Kitchen azithromycin (ZITHROMAX) 250 MG tablet Take 1 tablet every day until finished. (Patient not taking: Reported on 03/11/2018) 4 tablet 0  . capsaicin (ZOSTRIX) 0.025 % cream APPLY TO AFFECTED AREA TWICE A DAY (Patient not taking: Reported on 03/11/2018) 60 g 1  .  carvedilol (COREG) 12.5 MG tablet TAKE 1 TABLET BY MOUTH TWICE A DAY WITH A MEAL (Patient taking differently: Take 12.5 mg by mouth two times a day with a meal) 60 tablet 6  . ferrous sulfate 325 (65 FE) MG tablet Take 1 tablet (325 mg total) by mouth daily with breakfast. (Patient not taking: Reported on 03/11/2018) 30 tablet 2  . gabapentin (NEURONTIN) 300 MG capsule Take 300 mg by mouth daily.    Marland Kitchen guaifenesin (ROBITUSSIN) 100 MG/5ML syrup Take 10 mLs (200 mg total) by mouth 3 (three) times daily as needed for cough. 120 mL 0  . hydroxypropyl methylcellulose / hypromellose (ISOPTO TEARS / GONIOVISC) 2.5 % ophthalmic solution Place 1 drop 3 (three) times daily into both eyes. (Patient not taking: Reported on 03/11/2018) 15 mL 12  . loratadine (CLARITIN) 10 MG tablet TAKE 1/2 TABLET BY MOUTH DAILY (Patient not taking: Reported on 03/11/2018) 15 tablet 0  . losartan (COZAAR) 50 MG tablet TAKE 1 TABLET (50 MG TOTAL) BY MOUTH AT BEDTIME. 90 tablet 6  . metFORMIN (GLUCOPHAGE) 500 MG tablet Take 1 tablet (500 mg total) by mouth daily with breakfast. 90 tablet 3  . PATADAY 0.2 % SOLN APPLY 1 DROP TO EYE DAILY. (Patient not taking: Reported on 03/11/2018) 2.5 mL 0  . petrolatum-hydrophilic-aloe vera (ALOE VESTA) ointment Apply topically 2 (two) times daily as needed for wound care. (Patient not taking: Reported on 03/11/2018) 226 g 0  . ranitidine (ZANTAC) 150 MG tablet TAKE 1 TABLET BY MOUTH TWICE A  DAY (Patient taking differently: 150MG  BY MOUTH TWICE DAILY) 60 tablet 3  . rosuvastatin (CRESTOR) 20 MG tablet TAKE 1 TABLET BY MOUTH EVERYDAY AT BEDTIME 30 tablet 6  . senna-docusate (SENOKOT-S) 8.6-50 MG tablet Take 2 tablets by mouth at bedtime. (Patient taking differently: Take 1 tablet by mouth every other day. ) 60 tablet 3  . silver sulfADIAZINE (SILVADENE) 1 % cream Apply 1 application topically daily. (Patient not taking: Reported on 03/11/2018) 50 g 0  . traMADol (ULTRAM) 50 MG tablet Take 1 tablet (50 mg total) by mouth every 8 (eight) hours as needed for severe pain. 30 tablet 0  . [DISCONTINUED] cetirizine (ZYRTEC) 5 MG tablet Take 1 tablet (5 mg total) by mouth daily. Take at night time as it can cause some drowsiness 30 tablet 4    Objective: BP (!) 164/55 (BP Location: Right Arm)   Pulse 71   Temp 98.4 F (36.9 C) (Oral)   Resp 16   SpO2 98%  Exam: General: elderly female comfortably lying in bed, well developed, NAD with non-toxic appearance HEENT: normocephalic, atraumatic, moist mucous membranes, PERRLA, EOMI Neck: supple, non-tender without lymphadenopathy Cardiovascular: regular rate and rhythm without murmurs, rubs, or gallops Lungs: clear to auscultation bilaterally with normal work of breathing Abdomen: soft, non-tender, non-distended, normoactive bowel sounds Skin: warm, dry, no rashes or lesions, cap refill < 2 seconds Extremities: warm and well perfused, normal tone, no edema Psych: euthymic mood, congruent affect Neuro: moves all 4 extremities spontaneously, no facial droop or dyarthria  Labs and Imaging: CBC BMET  Recent Labs  Lab 04/03/18 2326  WBC 3.9*  HGB 9.8*  HCT 32.8*  PLT 210   Recent Labs  Lab 04/03/18 2326  NA 141  K 2.9*  CL 106  CO2 27  BUN 11  CREATININE 0.91  GLUCOSE 154*  CALCIUM 11.0*     Urinalysis    Component Value Date/Time   COLORURINE YELLOW 04/03/2018 2312  APPEARANCEUR CLOUDY (A) 04/03/2018 2312   LABSPEC  1.017 04/03/2018 2312   PHURINE 6.0 04/03/2018 2312   GLUCOSEU NEGATIVE 04/03/2018 2312   HGBUR SMALL (A) 04/03/2018 2312   HGBUR negative 10/21/2009 1424   BILIRUBINUR NEGATIVE 04/03/2018 2312   BILIRUBINUR NEG 08/03/2015 1240   KETONESUR 5 (A) 04/03/2018 2312   PROTEINUR 100 (A) 04/03/2018 2312   UROBILINOGEN 0.2 09/16/2015 1613   NITRITE POSITIVE (A) 04/03/2018 2312   LEUKOCYTESUR SMALL (A) 04/03/2018 2312    DG CHEST PORT 1 VIEW (04/04/2018) Pending    [place] Bing, DO 04/04/2018, 1:10 AM PGY-2, [place] Intern pager: [phone], text pages welcome

## 2018-04-04 NOTE — ED Notes (Signed)
PAGED FAMILY MED TO RN

## 2018-04-04 NOTE — ED Notes (Signed)
Called lab to add on differential  

## 2018-04-05 ENCOUNTER — Encounter (HOSPITAL_COMMUNITY): Payer: Self-pay | Admitting: Family Medicine

## 2018-04-05 DIAGNOSIS — J45909 Unspecified asthma, uncomplicated: Secondary | ICD-10-CM | POA: Diagnosis present

## 2018-04-05 DIAGNOSIS — I5030 Unspecified diastolic (congestive) heart failure: Secondary | ICD-10-CM | POA: Diagnosis present

## 2018-04-05 DIAGNOSIS — L97429 Non-pressure chronic ulcer of left heel and midfoot with unspecified severity: Secondary | ICD-10-CM | POA: Diagnosis present

## 2018-04-05 DIAGNOSIS — R410 Disorientation, unspecified: Secondary | ICD-10-CM

## 2018-04-05 DIAGNOSIS — Z66 Do not resuscitate: Secondary | ICD-10-CM | POA: Diagnosis present

## 2018-04-05 DIAGNOSIS — E785 Hyperlipidemia, unspecified: Secondary | ICD-10-CM | POA: Diagnosis present

## 2018-04-05 DIAGNOSIS — I2510 Atherosclerotic heart disease of native coronary artery without angina pectoris: Secondary | ICD-10-CM | POA: Diagnosis present

## 2018-04-05 DIAGNOSIS — Z86718 Personal history of other venous thrombosis and embolism: Secondary | ICD-10-CM | POA: Diagnosis not present

## 2018-04-05 DIAGNOSIS — Z86711 Personal history of pulmonary embolism: Secondary | ICD-10-CM | POA: Diagnosis not present

## 2018-04-05 DIAGNOSIS — Z85038 Personal history of other malignant neoplasm of large intestine: Secondary | ICD-10-CM | POA: Diagnosis not present

## 2018-04-05 DIAGNOSIS — E1151 Type 2 diabetes mellitus with diabetic peripheral angiopathy without gangrene: Secondary | ICD-10-CM | POA: Diagnosis present

## 2018-04-05 DIAGNOSIS — N3000 Acute cystitis without hematuria: Principal | ICD-10-CM

## 2018-04-05 DIAGNOSIS — N390 Urinary tract infection, site not specified: Secondary | ICD-10-CM | POA: Diagnosis not present

## 2018-04-05 DIAGNOSIS — Z1629 Resistance to other single specified antibiotic: Secondary | ICD-10-CM | POA: Diagnosis present

## 2018-04-05 DIAGNOSIS — M79672 Pain in left foot: Secondary | ICD-10-CM | POA: Diagnosis present

## 2018-04-05 DIAGNOSIS — E876 Hypokalemia: Secondary | ICD-10-CM | POA: Diagnosis not present

## 2018-04-05 DIAGNOSIS — B9620 Unspecified Escherichia coli [E. coli] as the cause of diseases classified elsewhere: Secondary | ICD-10-CM | POA: Diagnosis present

## 2018-04-05 DIAGNOSIS — Z853 Personal history of malignant neoplasm of breast: Secondary | ICD-10-CM | POA: Diagnosis not present

## 2018-04-05 DIAGNOSIS — M109 Gout, unspecified: Secondary | ICD-10-CM | POA: Diagnosis present

## 2018-04-05 DIAGNOSIS — I110 Hypertensive heart disease with heart failure: Secondary | ICD-10-CM | POA: Diagnosis present

## 2018-04-05 DIAGNOSIS — E11621 Type 2 diabetes mellitus with foot ulcer: Secondary | ICD-10-CM | POA: Diagnosis present

## 2018-04-05 DIAGNOSIS — F0390 Unspecified dementia without behavioral disturbance: Secondary | ICD-10-CM | POA: Diagnosis present

## 2018-04-05 DIAGNOSIS — Z79899 Other long term (current) drug therapy: Secondary | ICD-10-CM | POA: Diagnosis not present

## 2018-04-05 DIAGNOSIS — F17290 Nicotine dependence, other tobacco product, uncomplicated: Secondary | ICD-10-CM | POA: Diagnosis present

## 2018-04-05 DIAGNOSIS — Z1623 Resistance to quinolones and fluoroquinolones: Secondary | ICD-10-CM | POA: Diagnosis present

## 2018-04-05 LAB — GLUCOSE, CAPILLARY
GLUCOSE-CAPILLARY: 151 mg/dL — AB (ref 65–99)
Glucose-Capillary: 152 mg/dL — ABNORMAL HIGH (ref 65–99)
Glucose-Capillary: 133 mg/dL — ABNORMAL HIGH (ref 65–99)
Glucose-Capillary: 145 mg/dL — ABNORMAL HIGH (ref 65–99)

## 2018-04-05 LAB — CBC WITH DIFFERENTIAL/PLATELET
Abs Immature Granulocytes: 0 10*3/uL (ref 0.0–0.1)
BASOS ABS: 0 10*3/uL (ref 0.0–0.1)
BASOS PCT: 0 %
EOS ABS: 0.2 10*3/uL (ref 0.0–0.7)
Eosinophils Relative: 4 %
HCT: 32.5 % — ABNORMAL LOW (ref 36.0–46.0)
Hemoglobin: 10.1 g/dL — ABNORMAL LOW (ref 12.0–15.0)
Immature Granulocytes: 0 %
Lymphocytes Relative: 29 %
Lymphs Abs: 1.4 10*3/uL (ref 0.7–4.0)
MCH: 22.5 pg — ABNORMAL LOW (ref 26.0–34.0)
MCHC: 31.1 g/dL (ref 30.0–36.0)
MCV: 72.5 fL — ABNORMAL LOW (ref 78.0–100.0)
Monocytes Absolute: 0.3 10*3/uL (ref 0.1–1.0)
Monocytes Relative: 7 %
Neutro Abs: 2.9 10*3/uL (ref 1.7–7.7)
Neutrophils Relative %: 60 %
PLATELETS: 207 10*3/uL (ref 150–400)
RBC: 4.48 MIL/uL (ref 3.87–5.11)
RDW: 16.5 % — AB (ref 11.5–15.5)
WBC: 4.9 10*3/uL (ref 4.0–10.5)

## 2018-04-05 LAB — POTASSIUM: POTASSIUM: 3.2 mmol/L — AB (ref 3.5–5.1)

## 2018-04-05 MED ORDER — ALUM & MAG HYDROXIDE-SIMETH 200-200-20 MG/5ML PO SUSP
15.0000 mL | Freq: Four times a day (QID) | ORAL | Status: DC | PRN
  Administered 2018-04-05: 15 mL via ORAL
  Filled 2018-04-05: qty 30

## 2018-04-05 MED ORDER — CEPHALEXIN 500 MG PO CAPS
500.0000 mg | ORAL_CAPSULE | Freq: Two times a day (BID) | ORAL | Status: DC
  Administered 2018-04-05 – 2018-04-06 (×3): 500 mg via ORAL
  Filled 2018-04-05 (×3): qty 1

## 2018-04-05 MED ORDER — ONDANSETRON HCL 4 MG/2ML IJ SOLN
4.0000 mg | Freq: Three times a day (TID) | INTRAMUSCULAR | Status: DC | PRN
  Administered 2018-04-05 – 2018-04-06 (×2): 4 mg via INTRAVENOUS
  Filled 2018-04-05 (×2): qty 2

## 2018-04-05 NOTE — Progress Notes (Signed)
Went in to administer iv abx.  Patient was asleep but awoke.  She started yelling and saying that people just want to do whatever they want and she don't want no antibiotics.  I explained that she has a UTI and this medication will help her.  She stated " I don't want it right now, I'll tell you when I want it"!  I tried several explanations to no avail.  Medication not given.  She may be more rested and willing during the day.

## 2018-04-05 NOTE — Plan of Care (Signed)
  Problem: Education: Goal: Knowledge of General Education information will improve Outcome: Progressing   Problem: Clinical Measurements: Goal: Ability to maintain clinical measurements within normal limits will improve Outcome: Progressing   Problem: Clinical Measurements: Goal: Will remain free from infection Outcome: Progressing   Problem: Activity: Goal: Risk for activity intolerance will decrease Outcome: Progressing   Problem: Pain Managment: Goal: General experience of comfort will improve Outcome: Progressing   

## 2018-04-05 NOTE — Progress Notes (Signed)
She  keep refusing IV antibiotic. Her urine culture sensitivity is still pending. Since she had Ecoli in the past with good sensitivity to cephalosporin, we can switch her to oral Keflex and watch her overnight. Also discuss care plan with her family. Should she continue to refuse treatment, we will need to determine capacity prior to d/c home.  Please watch patient overnight on oral A/B.

## 2018-04-05 NOTE — Progress Notes (Addendum)
Family Medicine Teaching Service Daily Progress Note Intern Pager: [phone]  [name] Medical record number: [id number] Date of birth: 1924-10-07 Age: 84 y.o. Gender: female  Primary Care Provider: [place] Bing, DO Consultants: none Code Status: DNR  Pt Overview and Major Events to Date:  5/23 admitted to fpts 5/4: Ucx 100k CFU  Assessment and Plan: [name] is a 84 y.o. female presenting with AMS in the setting of acute cystitis. PMH is significant for HTN, NIDDM, HFpEF, h/o b/l-breast cancer and colon cancer.  1.  Altered mental status (resolved)  Acute cystitis:  UA positive for nitrites and LE. 100k CFU of GNR. Patient refusing IV antibiotics. Given that keflex overwhelmingly has activity against E Coli and patient refusing iv abx can switch to keflex today. Will get MOCA today. If patient passes without concerns will be satisfied that she has capacity to make her own decisions and can likely dc later today per her request.  - switch to keflex to complete a 7 day course - MOCA per speech - Monitor mentation - PT/OT consulted  2.  Hypercalcemia  Hypokalemia: Corrected calcium 11.36 upon arrival.  Seems to be ongoing issue over the last year. K 2.9 on arrival, up to 3.2 today. Replete with iv potassium once more. - Replete potassium and monitor - Continue half maintenance IVF for 12 hours  3.  Hypertension  Hyperlipidemia: Presented in the 160s/50s. bp 141/50 this am. Home medications include amlodipine, carvedilol, losartan.  Also on statin and daily aspirin. - Continue home amlodipine 5 mg daily and carvedilol 12.5 mg twice daily - continue home losartan 50 mg daily  4.  HFpEF: Chronic.  Last echo with EF 50-55% without wall motion abnormalities and G1DD.  Patient takes carvedilol. - Monitor fluid status with strict I&O's and daily weights  5.  Non-insulin-dependent diabetes mellitus: Chronic.  Adequately controlled.  A1c 6.7 as of 11/2017.   On metformin only. - Holding home metformin - Sensitive sliding scale  6.  History of bilateral breast cancer and colon cancer: Chronic.  Has been stable. - Monitor outpatient  FEN/GI: regular PPx: lovenox  Disposition: home w/ hh pt  Subjective:  Refused abx overnight. With some mild nausea this am. Doesn't want treatment because she doesn't think anything is wrong with her  Objective: Temp:  [97.9 F (36.6 C)-98.9 F (37.2 C)] 98.5 F (36.9 C) (05/24 0941) Pulse Rate:  [59-92] 70 (05/24 0941) Resp:  [15-24] 16 (05/24 0941) BP: (141-176)/(50-66) 141/50 (05/24 0941) SpO2:  [71 %-100 %] 98 % (05/24 0941) Weight:  [115 lb 15.4 oz (52.6 kg)] 115 lb 15.4 oz (52.6 kg) (05/23 1700) Physical Exam: General: elderly AA female resting comfortably in bed, NAD Cardiovascular: rrr, no m/r/g, palpable dp/pt bilaterally Lungs: CTAB with normal work of breathing Abdomen: soft, non-tender, non-distended, normoactive bowel sounds Skin: warm, dry, no rashes or lesions, cap refill < 2 seconds Extremities: warm and well perfused, normal tone, no edema Psych: euthymic mood, congruent affect Neuro: moves all 4 extremities spontaneously, no facial droop or dyarthria   Laboratory: Recent Labs  Lab 04/03/18 2326 04/04/18 0430 04/05/18 0723  WBC 3.9* 4.0 4.9  HGB 9.8* 9.0* 10.1*  HCT 32.8* 30.1* 32.5*  PLT 210 PLATELET CLUMPS NOTED ON SMEAR, COUNT APPEARS ADEQUATE 207   Recent Labs  Lab 04/03/18 2326 04/04/18 0430 04/05/18 0723  NA 141 142  --   K 2.9* 2.9* 3.2*  CL 106 108  --   CO2 27  25  --   BUN 11 9  --   CREATININE 0.91 0.88  --   CALCIUM 11.0* 10.4*  --   PROT 7.4  --   --   BILITOT 0.4  --   --   ALKPHOS 49  --   --   ALT 6*  --   --   AST 13*  --   --   GLUCOSE 154* 131*  --     Imaging/Diagnostic Tests:  Guadalupe Dawn, MD 04/05/2018, 11:35 AM PGY-1, [place] Intern pager: [phone], text pages welcome

## 2018-04-05 NOTE — Evaluation (Signed)
Physical Therapy Evaluation Patient Details Name: [name] MRN: [id number] DOB: 07-22-1924 Today's Date: 04/05/2018   History of Present Illness  Pt is a 84 y/o female admitted secondary to AMS in the setting of acute cystitis. PMH is significant for HTN, NIDDM, HFpEF, h/o b/l-breast cancer and colon cancer.    Clinical Impression  Pt presented supine in bed with HOB elevated, awake and willing to participate in therapy session. Prior to admission, pt reported that she required assistance with functional mobility and ADLs. Pt reported that she has an aide that comes to her home every morning to assist her with bathing, dressing, getting in and out of the bed and transfers to a w/c. Pt currently very limited secondary to nausea and emesis x1 during session. Pt's RN aware. Pt would continue to benefit from skilled physical therapy services at this time while admitted and after d/c to address the below listed limitations in order to improve overall safety and independence with functional mobility.     Follow Up Recommendations Home health PT;Supervision/Assistance - 24 hour    Equipment Recommendations  Other (comment)(TBD based on mobility progression)    Recommendations for Other Services       Precautions / Restrictions Precautions Precautions: Fall Restrictions Weight Bearing Restrictions: No      Mobility  Bed Mobility Overal bed mobility: Needs Assistance Bed Mobility: Supine to Sit;Sit to Supine     Supine to sit: Min guard;HOB elevated Sit to supine: Min assist   General bed mobility comments: increased time and effort, use of bed rails, min A to return bilateral LEs onto bed  Transfers                 General transfer comment: deferred secondary to nausea and emesis x1  Ambulation/Gait                Stairs            Wheelchair Mobility    Modified Rankin (Stroke Patients Only)       Balance Overall balance assessment: Needs  assistance Sitting-balance support: Bilateral upper extremity supported;Single extremity supported Sitting balance-Leahy Scale: Poor Sitting balance - Comments: requires at least one UE support and close min guard for safety                                     Pertinent Vitals/Pain Pain Assessment: Faces Faces Pain Scale: Hurts little more Pain Location: stomach/nausea Pain Descriptors / Indicators: Guarding Pain Intervention(s): Monitored during session;Repositioned    Home Living Family/patient expects to be discharged to:: Private residence Living Arrangements: Children Available Help at Discharge: Family;Personal care attendant;Available 24 hours/day Type of Home: House Home Access: Stairs to enter Entrance Stairs-Rails: Psychiatric nurse of Steps: 7 Home Layout: One level Home Equipment: Wheelchair - manual;Bedside commode      Prior Function Level of Independence: Needs assistance   Gait / Transfers Assistance Needed: uses a w/c for mobility, requires assistance with transfers, requires assistance with bed mobility  ADL's / Homemaking Assistance Needed: pt has an aide that comes every morning until noon (pt unsure of what time arrives) that assists her with bathing and dressing        Hand Dominance        Extremity/Trunk Assessment   Upper Extremity Assessment Upper Extremity Assessment: Defer to OT evaluation;Generalized weakness    Lower Extremity Assessment Lower Extremity Assessment: Generalized  weakness       Communication   Communication: No difficulties  Cognition Arousal/Alertness: Awake/alert Behavior During Therapy: WFL for tasks assessed/performed Overall Cognitive Status: Impaired/Different from baseline Area of Impairment: Memory                     Memory: Decreased short-term memory                General Comments      Exercises     Assessment/Plan    PT Assessment Patient needs  continued PT services  PT Problem List Decreased strength;Decreased activity tolerance;Decreased balance;Decreased mobility;Decreased coordination       PT Treatment Interventions DME instruction;Gait training;Stair training;Therapeutic activities;Functional mobility training;Balance training;Therapeutic exercise;Neuromuscular re-education;Patient/family education    PT Goals (Current goals can be found in the Care Plan section)  Acute Rehab PT Goals Patient Stated Goal: did not state PT Goal Formulation: With patient Time For Goal Achievement: 04/19/18 Potential to Achieve Goals: Good    Frequency Min 3X/week   Barriers to discharge        Co-evaluation               AM-PAC PT "6 Clicks" Daily Activity  Outcome Measure Difficulty turning over in bed (including adjusting bedclothes, sheets and blankets)?: None Difficulty moving from lying on back to sitting on the side of the bed? : Unable Difficulty sitting down on and standing up from a chair with arms (e.g., wheelchair, bedside commode, etc,.)?: Unable Help needed moving to and from a bed to chair (including a wheelchair)?: A Little Help needed walking in hospital room?: Total Help needed climbing 3-5 steps with a railing? : Total 6 Click Score: 11    End of Session   Activity Tolerance: Other (comment)(limited secondary to nausea and emesis x1 - RN aware) Patient left: in bed;with call bell/phone within reach;with bed alarm set Nurse Communication: Mobility status PT Visit Diagnosis: Other abnormalities of gait and mobility (R26.89)    Time: 1761-6073 PT Time Calculation (min) (ACUTE ONLY): 17 min   Charges:   PT Evaluation $PT Eval Moderate Complexity: 1 Mod     PT G Codes:        [place], PT, DPT [place] 04/05/2018, 9:17 AM

## 2018-04-05 NOTE — Evaluation (Signed)
Occupational Therapy Evaluation and Discharge Patient Details Name: [name] MRN: [id number] DOB: 1924-08-07 Today's Date: 04/05/2018    History of Present Illness Pt is a 84 y/o female admitted secondary to AMS in the setting of acute cystitis. PMH is significant for HTN, NIDDM, HFpEF, h/o b/l-breast cancer and colon cancer.   Clinical Impression   This 84 yo female admitted with above presents to acute OT with decreased mobility, decreased strength, decreased balance, but needing A for all basic ADLs (except feeding self pta) and needing A for all transfers pta. Feel pt is at or near baseline and she feels that she has the A she needs at home from her aids and dtr. No further OT needs, we will sign off.    Follow Up Recommendations  No OT follow up;Supervision/Assistance - 24 hour    Equipment Recommendations  None recommended by OT       Precautions / Restrictions Precautions Precautions: Fall Restrictions Weight Bearing Restrictions: No      Mobility Bed Mobility Overal bed mobility: Needs Assistance Bed Mobility: Supine to Sit     Supine to sit: Min assist(HOB flat and no rail)        Transfers Overall transfer level: Needs assistance   Transfers: Squat Pivot Transfers     Squat pivot transfers: Min assist          Balance Overall balance assessment: Needs assistance Sitting-balance support: No upper extremity supported;Feet supported Sitting balance-Leahy Scale: Fair                                     ADL either performed or assessed with clinical judgement   ADL Overall ADL's : Needs assistance/impaired Eating/Feeding: Independent Eating/Feeding Details (indicate cue type and reason): supported sitting in recliner Grooming: Set up;Supervision/safety Grooming Details (indicate cue type and reason): supported sitting in recliner Upper Body Bathing: Supervision/ safety;Set up Upper Body Bathing Details (indicate cue type and  reason): supported sitting in recliner Lower Body Bathing: Maximal assistance Lower Body Bathing Details (indicate cue type and reason): Min A partial stand Upper Body Dressing : Moderate assistance Upper Body Dressing Details (indicate cue type and reason): supported sitting in recliner Lower Body Dressing: Total assistance Lower Body Dressing Details (indicate cue type and reason): Min A partial stand Toilet Transfer: Minimal assistance;Squat-pivot Toilet Transfer Details (indicate cue type and reason): bed>recliner going to pt's right Toileting- Clothing Manipulation and Hygiene: Total assistance Toileting - Clothing Manipulation Details (indicate cue type and reason): Min A partial stand             Vision Patient Visual Report: No change from baseline              Pertinent Vitals/Pain Pain Assessment: Faces Faces Pain Scale: Hurts little more Pain Location: back Pain Descriptors / Indicators: Guarding Pain Intervention(s): Monitored during session;Limited activity within patient's tolerance;Repositioned     Hand Dominance Right(but uses her left mostly)--trigger finger on right   Extremity/Trunk Assessment Upper Extremity Assessment Upper Extremity Assessment: Generalized weakness           Communication Communication Communication: No difficulties   Cognition Arousal/Alertness: Awake/alert Behavior During Therapy: WFL for tasks assessed/performed Overall Cognitive Status: Within Functional Limits for tasks assessed  General Comments: pt could direct me how to help her out of bed and to recliner as her aide and dtr helps her at home              [place] expects to be discharged to:: Private residence Living Arrangements: Children Available Help at Discharge: Family;Personal care attendant;Available PRN/intermittently(aide is there 3-4 hours a day and dtr is in and out per pt) Type of Home:  House Home Access: Stairs to enter [employer] of Steps: 7 Entrance Stairs-Rails: Right;Left Home Layout: One level     Bathroom Shower/Tub: (sponge baths only)   Bathroom Toilet: Standard     Home Equipment: Wheelchair - manual;Bedside commode   Additional Comments: pt reports she only transfers, does not ambulate and that she only transfers when someone is around so they can help her      Prior Functioning/Environment Level of Independence: Needs assistance  Gait / Transfers Assistance Needed: uses a w/c for mobility, requires assistance with transfers, requires assistance with bed mobility ADL's / Homemaking Assistance Needed: pt has an aide that comes every morning until noon 7 days a week (pt unsure of what time arrives) that assists her with bathing and dressing and toileting. pt is able to feed herself            OT Problem List: Decreased strength;Impaired balance (sitting and/or standing);Pain         OT Goals(Current goals can be found in the care plan section) Acute Rehab OT Goals Patient Stated Goal: to go home today  OT Frequency:                AM-PAC PT "6 Clicks" Daily Activity     Outcome Measure Help from another person eating meals?: None Help from another person taking care of personal grooming?: A Little Help from another person toileting, which includes using toliet, bedpan, or urinal?: A Lot Help from another person bathing (including washing, rinsing, drying)?: A Lot Help from another person to put on and taking off regular upper body clothing?: A Lot Help from another person to put on and taking off regular lower body clothing?: Total 6 Click Score: 14   End of Session Equipment Utilized During Treatment: Gait belt Nurse Communication: Mobility status(with NT and purewick needs to be replaced)  Activity Tolerance: Patient tolerated treatment well Patient left: in chair;with call bell/phone within reach;with chair alarm set  OT  Visit Diagnosis: Unsteadiness on feet (R26.81);Pain Pain - part of body: (back)                Time: 0865-7846 OT Time Calculation (min): 24 min Charges:  OT General Charges $OT Visit: 1 Visit OT Evaluation $OT Eval Moderate Complexity: 1 Mod OT Treatments $Self Care/Home Management : 8-22 mins [place], OTR/L [phone] 04/05/2018

## 2018-04-06 LAB — POTASSIUM: POTASSIUM: 3.8 mmol/L (ref 3.5–5.1)

## 2018-04-06 LAB — GLUCOSE, CAPILLARY
Glucose-Capillary: 138 mg/dL — ABNORMAL HIGH (ref 65–99)
Glucose-Capillary: 140 mg/dL — ABNORMAL HIGH (ref 65–99)

## 2018-04-06 LAB — CBC WITH DIFFERENTIAL/PLATELET
ABS IMMATURE GRANULOCYTES: 0 10*3/uL (ref 0.0–0.1)
Basophils Absolute: 0 10*3/uL (ref 0.0–0.1)
Basophils Relative: 0 %
EOS PCT: 3 %
Eosinophils Absolute: 0.1 10*3/uL (ref 0.0–0.7)
HEMATOCRIT: 33.5 % — AB (ref 36.0–46.0)
HEMOGLOBIN: 10.2 g/dL — AB (ref 12.0–15.0)
Immature Granulocytes: 0 %
LYMPHS PCT: 31 %
Lymphs Abs: 1.6 10*3/uL (ref 0.7–4.0)
MCH: 22.3 pg — ABNORMAL LOW (ref 26.0–34.0)
MCHC: 30.4 g/dL (ref 30.0–36.0)
MCV: 73.1 fL — ABNORMAL LOW (ref 78.0–100.0)
Monocytes Absolute: 0.4 10*3/uL (ref 0.1–1.0)
Monocytes Relative: 8 %
NEUTROS PCT: 58 %
Neutro Abs: 3.1 10*3/uL (ref 1.7–7.7)
Platelets: 187 10*3/uL (ref 150–400)
RBC: 4.58 MIL/uL (ref 3.87–5.11)
RDW: 16.2 % — ABNORMAL HIGH (ref 11.5–15.5)
WBC: 5.3 10*3/uL (ref 4.0–10.5)

## 2018-04-06 LAB — URINE CULTURE

## 2018-04-06 MED ORDER — CEPHALEXIN 500 MG PO CAPS: 500.0000 mg | ORAL_CAPSULE | Freq: Two times a day (BID) | ORAL | 0 refills | Status: AC

## 2018-04-06 NOTE — Progress Notes (Signed)
Family Medicine Teaching Service Daily Progress Note Intern Pager: [phone]  [name] Medical record number: [id number] Date of birth: [birth year] Age: 84 y.o. Gender: female  Primary Care Provider: Azusa Bing, DO Consultants: none Code Status: DNR  Pt Overview and Major Events to Date:  5/23 admitted to fpts 5/24: Ucx 100k CFU  Assessment and Plan: [name] is a 84 y.o. female presenting with AMS in the setting of acute cystitis. PMH is significant for HTN, NIDDM, HFpEF, h/o b/l-breast cancer and colon cancer.  Altered mental status (resolved)  Acute cystitis:  UA positive for nitrites and LE. 100k CFU of GNR. Switched to oral keflex on 5/24. Afebrile, VSS, WBC stable. Awaiting sensitivities, but can likely be discharged this pm on oral keflex. Felt to be back to her baseline. OT with no follow up necessary. PT eval penidng - complete 7 day course of keflex - MOCA per speech - PT/OT consulted  Hypercalcemia  Hypokalemia: Corrected calcium 11.36 upon arrival.  Seems to be ongoing issue over the last year. K 2.9 on arrival, up to 3.8 today. - hypercalcemia workup deferred to outpatient - recheck k at outpt follow up  Hypertension  Hyperlipidemia: Presented in the 160s/50s. bp 151/66 this am. Home medications include amlodipine, carvedilol, losartan.  Also on statin and daily aspirin. - Continue home amlodipine 5 mg daily and carvedilol 12.5 mg twice daily - continue home losartan 50 mg daily - consider increasing amlodipine dosing at outpatient follow up  4.  HFpEF: Chronic.  Last echo with EF 50-55% without wall motion abnormalities and G1DD.  Patient takes carvedilol. - Monitor fluid status with strict I&O's and daily weights   Non-insulin-dependent diabetes mellitus: Chronic.  Adequately controlled.  A1c 6.7 as of 11/2017.  On metformin only. - Holding home metformin - Sensitive sliding scale  History of bilateral breast cancer and colon  cancer: Chronic.  Has been stable. - Monitor outpatient  FEN/GI: regular PPx: lovenox  Disposition: home w/ hh pt  Subjective:  Doing really well this morning. No issues. Ready to go home.  Objective: Temp:  [98.2 F (36.8 C)-98.7 F (37.1 C)] 98.2 F (36.8 C) (05/25 0552) Pulse Rate:  [69-75] 69 (05/25 0552) Resp:  [16] 16 (05/24 0941) BP: (141-160)/(50-66) 151/66 (05/25 0552) SpO2:  [98 %-99 %] 99 % (05/25 0552) Weight:  [118 lb 6.2 oz (53.7 kg)] 118 lb 6.2 oz (53.7 kg) (05/25 0552) Physical Exam: General: elderly African American female resting comfortably in bed, No Acute Distress Cardiovascular: regular rate rhythm, no murmurs/rubs/gallops, palpable dp/pt bilaterally Lungs: Clear to auscultation bilaterally with normal work of breathing Abdomen: soft, non-tender, non-distended, normoactive bowel sounds Skin: warm, dry, no rashes or lesions, cap refill < 2 seconds Extremities: warm and well perfused, normal tone, no edema Psych: euthymic mood, congruent affect Neuro: moves all 4 extremities spontaneously, no facial droop or dyarthria  Laboratory: Recent Labs  Lab 04/04/18 0430 04/05/18 0723 04/06/18 0415  WBC 4.0 4.9 5.3  HGB 9.0* 10.1* 10.2*  HCT 30.1* 32.5* 33.5*  PLT PLATELET CLUMPS NOTED ON SMEAR, COUNT APPEARS ADEQUATE 207 187   Recent Labs  Lab 04/03/18 2326 04/04/18 0430 04/05/18 0723 04/06/18 0415  NA 141 142  --   --   K 2.9* 2.9* 3.2* 3.8  CL 106 108  --   --   CO2 27 25  --   --   BUN 11 9  --   --   CREATININE 0.91 0.88  --   --  CALCIUM 11.0* 10.4*  --   --   PROT 7.4  --   --   --   BILITOT 0.4  --   --   --   ALKPHOS 49  --   --   --   ALT 6*  --   --   --   AST 13*  --   --   --   GLUCOSE 154* 131*  --   --     Imaging/Diagnostic Tests:  Guadalupe Dawn, MD 04/06/2018, 9:34 AM PGY-1, [place] Intern pager: [phone], text pages welcome

## 2018-04-06 NOTE — Plan of Care (Signed)
  Problem: Nutrition: Goal: Adequate nutrition will be maintained Outcome: Progressing   Problem: Elimination: Goal: Will not experience complications related to bowel motility Outcome: Progressing   Problem: Safety: Goal: Ability to remain free from injury will improve Outcome: Progressing   Problem: Skin Integrity: Goal: Risk for impaired skin integrity will decrease Outcome: Progressing   

## 2018-04-06 NOTE — Discharge Instructions (Signed)
You were admitted to the hospital for a urinary tract infection. We have determined that this is e.coli and is sensitive to an antibiotic called keflex. You will take this antibiotic for 5 more days. You will take 500mg  two times per day. Please follow up with dr. Ola Spurr in the family medicine clinic on 5/29 @ 930. Please arrive about 15 minutes early.    Urinary Tract Infection, Adult A urinary tract infection (UTI) is an infection of any part of the urinary tract. The urinary tract includes the:  Kidneys.  Ureters.  Bladder.  Urethra.  These organs make, store, and get rid of pee (urine) in the body. Follow these instructions at home:  Take over-the-counter and prescription medicines only as told by your doctor.  If you were prescribed an antibiotic medicine, take it as told by your doctor. Do not stop taking the antibiotic even if you start to feel better.  Avoid the following drinks: ? Alcohol. ? Caffeine. ? Tea. ? Carbonated drinks.  Drink enough fluid to keep your pee clear or pale yellow.  Keep all follow-up visits as told by your doctor. This is important.  Make sure to: ? Empty your bladder often and completely. Do not to hold pee for long periods of time. ? Empty your bladder before and after sex. ? Wipe from front to back after a bowel movement if you are female. Use each tissue one time when you wipe. Contact a doctor if:  You have back pain.  You have a fever.  You feel sick to your stomach (nauseous).  You throw up (vomit).  Your symptoms do not get better after 3 days.  Your symptoms go away and then come back. Get help right away if:  You have very bad back pain.  You have very bad lower belly (abdominal) pain.  You are throwing up and cannot keep down any medicines or water. This information is not intended to replace advice given to you by your health care provider. Make sure you discuss any questions you have with your health care  provider. Document Released: 04/17/2008 Document Revised: 04/06/2016 Document Reviewed: 09/20/2015 Elsevier Interactive Patient Education  2018 [employer].  Urine Culture and Sensitivity Testing Why am I having this test? A urine culture is a test to see if germs grow from your urine sample. Normally, urine is free of germs (sterile). Germs in urine are usually bacteria. Sometimes they can be yeasts. These germs can cause a urinary tract infection (UTI). You may have this test if you have symptoms of a UTI. These may include:  Frequent urination.  Burning pain when passing urine.  If you are pregnant, your health care provider may order this test to screen you for a UTI. When you pass urine, the urine flows through the tube that empties your bladder (urethra). In men, urine comes out through an opening at the tip of the penis. In women, it comes out of the body from just above the vaginal opening. These areas may have bacteria near them that normally live on the skin (normal flora). What kind of sample is taken? A urine sample for a culture test must be collected in a way that keeps normal flora from getting into the sample. The method used most often is called a clean-catch sample. In a few cases, urine may need to be collected directly from the bladder using a thin, flexible tube (catheter). The health care provider puts the catheter through the person's urethra and  into the bladder. Your urine sample will be placed onto plates containing a substance that encourages bacteria to grow (agar plates). These plates are kept at body temperature for 24-48 hours to see if bacteria or other germs grow. Then, a lab technician examines them under a microscope to check for germs. Any germs that grow from the culture will be tested against a variety of medicines to find the one that works best (sensitivity testing). For a UTI caused by bacteria, several types of antibiotic medicines may be tested. How do  I prepare for this test?  Do not urinate for about an hour before collecting the sample.  Drink a glass of water about 20 minutes before collecting the sample.  Tell your health care provider if you have been taking antibiotics. This may affect the results of your test. Your health care provider may give you sterile wipes to clean your vagina or penis to prepare for collecting a clean-catch sample. To collect the sample, you will need to do the following: For Women and Girls  Sit on the toilet and spread the lips of your vagina.  Use one wipe to clean your vaginal area from front to back.  Use a second wipe to clean the opening of your urethra.  Pass a small amount of urine directly into the toilet while still spreading your vagina.  Then, hold the sterile cup underneath you and urinate into it.  Fill the cup about halfway. Cap it and return it for testing. For Men and Boys  Use the sterile wipe to clean the tip of your penis.  Pass a small amount of urine directly into the toilet first.  Then, urinate into the sterile cup.  Fill the cup about halfway. Cap it and return it for testing. What do the results mean? The result of a urine culture and sensitivity test will be positive or negative.  If enough bacteria grow from your urine sample, your test result is considered positive.  If many different bacteria grow from your urine sample, your test may be reported as contaminated.  If no bacteria grow from your sample after 24-48 hours, your test result is considered negative.  Results of sensitivity testing let your health care provider know which medicines to use to treat your infection.  If the results of your urine culture are negative, this means:  It is less likely that you have a UTI.  Your test may be repeated if you still have symptoms.  If the results of your urine culture are positive, this means:  It is more likely that you have a UTI.  You may need to start  treatment based on your sensitivity results.  Talk to your health care provider to discuss your results, treatment options, and if necessary, the need for more tests. It is your responsibility to obtain your test results. Ask the lab or department performing the test when and how you will get your results. Talk with your health care provider if you have any questions about your results. Talk with your health care provider to discuss your results, treatment options, and if necessary, the need for more tests. Talk with your health care provider if you have any questions about your results. This information is not intended to replace advice given to you by your health care provider. Make sure you discuss any questions you have with your health care provider. Document Released: 11/24/2004 Document Revised: 07/05/2016 Document Reviewed: 02/26/2014 Elsevier Interactive Patient Education  2018 Elsevier  Inc. ° °

## 2018-04-06 NOTE — Progress Notes (Signed)
Provided discharge education/instructions to daughter [name]) and Pt, all questions and concerns addressed, Pt discharged home with belongings.

## 2018-04-06 NOTE — Care Management Note (Signed)
Case Management Note  Patient Details  Name: [name] MRN: [id number] Date of Birth: 24-Jul-1924  Subjective/Objective:  84 yr old female admitted with acute cystitis.                Action/Plan: Case manager spoke with patient concerning discharge plan. [name] asked me to contact her [name]: [phone]. CM spoke with [name] concerning need for Home Health services., she said her mom had just completed therapy through [facility], and she is agreeable to [facility] resuming services. Case manager called referral to [name], [place] Liaison.    Expected Discharge Date:    04/06/18              Expected Discharge Plan:  [place]  In-House Referral:  NA  Discharge planning Services  CM Consult  Post Acute Care Choice:  Resumption of Svcs/PTA Provider, Home Health Choice offered to:  Patient, Adult Children  DME Arranged:  N/A DME Agency:  NA  HH Arranged:  RN, PT, OT, Social Work [employer] Agency:  [place]  Status of Service:  Completed, signed off  If discussed at [employer], dates discussed:    Additional Comments:  Ninfa Meeker, RN 04/06/2018, 10:37 AM

## 2018-04-06 NOTE — Progress Notes (Signed)
Physical Therapy Treatment Patient Details Name: [name] MRN: [id number] DOB: 05/18/1924 Today's Date: 04/06/2018    History of Present Illness Pt is a 84 y/o female admitted secondary to AMS in the setting of acute cystitis. PMH is significant for HTN, NIDDM, HFpEF, h/o b/l-breast cancer and colon cancer.    PT Comments    Pt refusing OOB mobility this session, stating that she would "throw up" if she had to sit on the side of the bed. Pt agreeable to bilateral LE strengthening exercises (see below). Pt would continue to benefit from skilled physical therapy services at this time while admitted and after d/c to address the below listed limitations in order to improve overall safety and independence with functional mobility.    Follow Up Recommendations  Home health PT;Supervision/Assistance - 24 hour     Equipment Recommendations  Other (comment)(TBD)    Recommendations for Other Services       Precautions / Restrictions Precautions Precautions: Fall Restrictions Weight Bearing Restrictions: No    Mobility  Bed Mobility               General bed mobility comments: pt able to achieve long sitting in bed with trunk unsupported with min A; pt refusing any further mobility  Transfers                    Ambulation/Gait                 Stairs             Wheelchair Mobility    Modified Rankin (Stroke Patients Only)       Balance Overall balance assessment: Needs assistance Sitting-balance support: Feet supported;Bilateral upper extremity supported;Single extremity supported Sitting balance-Leahy Scale: Poor                                      Cognition Arousal/Alertness: Awake/alert Behavior During Therapy: WFL for tasks assessed/performed Overall Cognitive Status: No family/caregiver present to determine baseline cognitive functioning                                        Exercises General  Exercises - Lower Extremity Ankle Circles/Pumps: AROM;Both;20 reps Gluteal Sets: AROM;Strengthening;Both;10 reps;Supine Hip ABduction/ADduction: AAROM;Both;10 reps;Supine Straight Leg Raises: AAROM;Both;10 reps;Supine    General Comments        Pertinent Vitals/Pain Pain Assessment: No/denies pain    Home Living     Available Help at Discharge: Family;Personal care attendant;Available PRN/intermittently Type of Home: House              Prior Function            PT Goals (current goals can now be found in the care plan section) Acute Rehab PT Goals PT Goal Formulation: With patient Time For Goal Achievement: 04/19/18 Potential to Achieve Goals: Good Progress towards PT goals: Progressing toward goals    Frequency    Min 3X/week      PT Plan Current plan remains appropriate    Co-evaluation              AM-PAC PT "6 Clicks" Daily Activity  Outcome Measure  Difficulty turning over in bed (including adjusting bedclothes, sheets and blankets)?: None Difficulty moving from lying on back to sitting on the side of the  bed? : Unable Difficulty sitting down on and standing up from a chair with arms (e.g., wheelchair, bedside commode, etc,.)?: Unable Help needed moving to and from a bed to chair (including a wheelchair)?: A Little Help needed walking in hospital room?: Total Help needed climbing 3-5 steps with a railing? : Total 6 Click Score: 11    End of Session   Activity Tolerance: Other (comment)(pt self limiting out of fear of emesis) Patient left: in bed;with call bell/phone within reach;with bed alarm set Nurse Communication: Mobility status PT Visit Diagnosis: Other abnormalities of gait and mobility (R26.89)     Time: 9371-6967 PT Time Calculation (min) (ACUTE ONLY): 14 min  Charges:  $Therapeutic Exercise: 8-22 mins                    G Codes:       [place], PT, DPT [place] 04/06/2018, 11:13 AM

## 2018-04-06 NOTE — Evaluation (Addendum)
Speech Language Pathology Evaluation Patient Details Name: [name] MRN: [id number] DOB: 15-Nov-1923 Today's Date: 04/06/2018 Time: 0814-4818 SLP Time Calculation (min) (ACUTE ONLY): 13 min  Problem List:  Patient Active Problem List   Diagnosis Date Noted  . Disorientation   . Acute cystitis 04/04/2018  . Swelling of left hand 12/10/2017  . Cough in adult   . Left knee pain 08/09/2017  . Protein-calorie malnutrition, severe 02/06/2017  . Urinary tract infection without hematuria 02/03/2017  . Hypokalemia 10/13/2016  . Pressure injury of skin 08/31/2016  . Pulmonary embolus ([place]) 08/30/2016  . Mild dementia 06/01/2016  . Depression 01/20/2016  . Suicidal ideation 01/19/2016  . Prolapse of female pelvic organs 12/28/2015  . Leg weakness, bilateral 12/08/2015  . Weakness of right lower extremity 11/25/2015  . Dry eyes 10/28/2015  . (HFpEF) heart failure with preserved ejection fraction ([place])   . CAD in native artery   . Gout 05/14/2015  . Dyslipidemia   . Essential hypertension   . Seasonal allergies 02/10/2015  . Stroke-like symptoms   . Stroke-like episode ([place]) 01/25/2015  . Syncope and collapse 01/25/2015  . Dizziness 09/19/2014  . Anemia of chronic illness 08/19/2014  . Acute superficial venous thrombosis of right lower extremity 08/19/2014  . Hypercalcemia 06/09/2014  . Breast cancer, left breast ([place]) 06/09/2014  . Breast cancer, right breast ([place]) 06/09/2014  . Edema of left lower extremity 03/30/2014  . PAD (peripheral artery disease) ([place]) 03/30/2014  . History of colon cancer 12/11/2013  . History of fall 05/15/2013  . Bilateral leg pain 03/07/2013  . Lower extremity edema 02/19/2013  . Debility 01/22/2013  . Stricture and stenosis of esophagus 10/27/2012  . Constipation 06/24/2012  . Thoracic back pain 05/24/2012  . Tobacco abuse 06/02/2011  . History of breast cancer in female, bilateral.  Mastectomies 10/24/2010. 04/26/2011  . Hiatal hernia  03/22/2011  . Colon polyp 03/22/2011  . Poor circulation 03/22/2011  . Cough 01/31/2011  . 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 Neuropathy ([place]) 08/25/2009  . ANEMIA-IRON DEFICIENCY 04/29/2007  . HLD (hyperlipidemia) 01/29/2007  . Controlled type 2 diabetes mellitus without complication, without long-term current use of insulin ([place]) 01/10/2007  . HYPERTENSION, BENIGN SYSTEMIC 01/10/2007  . Coronary atherosclerosis 01/10/2007  . Chronic systolic heart failure ([place]) 01/10/2007  . Asthma 01/10/2007  . Reflux esophagitis 01/10/2007  . Osteoarthritis, multiple sites 01/10/2007   Past Medical History:  Past Medical History:  Diagnosis Date  . Allergy   . Anemia    Iron def anemia  . Arthritis   . Asthma   . Breast cancer ([place]) 10/2010   Invasive Ductal Carcinoma, s/p bilateral mastectomy, now on Tamoxifen. Followed by Dr Jamse Arn.   . Breast cancer ([facility]) 2011   s/p Bilateral masectomy  . CAD (coronary artery disease)   . Carotid artery aneurysm ([place]) 08/2010   right ICA, 5 x 65mm  . CHF (congestive heart failure) ([facility])    Systolic   . Colon cancer ([place])   . Colon cancer ([place]) 12/11/2013  . DDD (degenerative disc disease), cervical   . Diabetes mellitus   . Diverticulosis   . GERD (gastroesophageal reflux disease)   . H/O: GI bleed   . Hiatal hernia   . History of colon cancer   . Hypercalcemia 06/09/2014  . Hyperlipidemia   . Hypertension   . Myocardial infarction ([place])   . Neuropathy   . Pneumonia 03/2012  . Shortness of breath    Past Surgical History:  Past  Surgical History:  Procedure Laterality Date  . Breast masectomy  10/2010   Bilateral masectomy by Dr Lucia Gaskins s/p invasive ductal carcinoma.  Marland Kitchen BREAST SURGERY    . Cardiac  Catherization  2007   Severe 3-vessel disease.  EF 20-25%.  . ESOPHAGOGASTRODUODENOSCOPY  2001   Esophageal Tear  . ESOPHAGOGASTRODUODENOSCOPY  10/27/2012   Procedure: ESOPHAGOGASTRODUODENOSCOPY (EGD);  Surgeon: Irene Shipper, MD;   Location: [facility] ENDOSCOPY;  Service: Endoscopy;  Laterality: N/A;  pat  . JOINT REPLACEMENT Right 2001   Knee  . TOTAL KNEE ARTHROPLASTY  2001   HPI:  [name] is a 84 y.o. female presenting with AMS in the setting of acute cystitis. PMH is significant for HTN, NIDDM, HFpEF, h/o b/l-breast cancer and colon cancer.   Assessment / Plan / Recommendation Clinical Impression   Patient presents with cognitive impairments, with impaired storage and short-term recall, functional problem solving, and reasoning seen on [place]. Pt reports she does not read or write. She also has significant visual deficits which limit testing (unable to administer visuoperception, trailmaking, confrontation naming or visual attention portions of the assessment). Pt scored 5/19 on items administered. Even with intact performance on remaining tasks which could not be administered (would give her a score of 16/30), her score falls in the range of "dementia" per [facility] scoring. The MOCA is a screening tool and cannot be used to diagnose dementia or to determine competency for decisionmaking. If concerns about pt's competency to make medical decisions, consider psychological evaluation. Supplemental informal assessments of cognition reveal that pt is able to describe the type of assistance she needs for daily activities; I suspect her functional cognition is slightly higher than formal testing indicates. No family present to confirm pt's baseline functioning. I did reach [name] via phone, who reports she feels her mother is at her baseline with regard to cognition, however she denies that pt had difficulty with memory prior to admission. I informed her of results of assessment and that pt does appear to have difficulties with recall of new information and short term memory; I suggested that if she feels this is a new issue that pt might benefit from home health SLP to address cognitive deficits. Daughter declines at this time,  and feels pt will have necessary level of assistance upon return to home. I encouraged her to follow up with MD and request [facility] SLP orders should she note functional deficits on pt's return to home. No further skilled ST needs identified; SLP will s/o.         SLP Assessment  SLP Recommendation/Assessment: Patient does not need any further Speech Lanaguage Pathology Services SLP Visit Diagnosis: Cognitive communication deficit (R41.841)    Follow Up Recommendations       Frequency and Duration           SLP Evaluation Cognition  Overall Cognitive Status: No family/caregiver present to determine baseline cognitive functioning Arousal/Alertness: Awake/alert Orientation Level: Oriented to person;Oriented to place;Oriented to situation;Disoriented to time Attention: Sustained Sustained Attention: Appears intact Memory: Impaired Memory Impairment: Storage deficit;Decreased recall of new information;Decreased short term memory Decreased Short Term Memory: Verbal basic;Functional basic Awareness: Appears intact Problem Solving: Impaired Problem Solving Impairment: Verbal basic;Functional basic Safety/Judgment: Appears intact       Comprehension  Auditory Comprehension Overall Auditory Comprehension: Appears within functional limits for tasks assessed Yes/No Questions: Within Functional Limits Commands: Within Functional Limits Conversation: Simple Visual Recognition/Discrimination Discrimination: Not tested Reading Comprehension Reading Status: Not tested  Expression Expression Primary Mode of Expression: Verbal Verbal Expression Overall Verbal Expression: Appears within functional limits for tasks assessed Naming: Impairment Divergent: 0-24% accurate Written Expression Dominant Hand: Right Written Expression: Not tested   Oral / Motor  Oral Motor/Sensory Function Overall Oral Motor/Sensory Function: Within functional limits Motor Speech Overall Motor Speech: Appears  within functional limits for tasks assessed   [place], [place], CCC-SLP Speech-Language Pathologist [place] 04/06/2018, 10:12 AM

## 2018-04-08 DIAGNOSIS — J189 Pneumonia, unspecified organism: Secondary | ICD-10-CM | POA: Diagnosis not present

## 2018-04-08 DIAGNOSIS — I110 Hypertensive heart disease with heart failure: Secondary | ICD-10-CM | POA: Diagnosis not present

## 2018-04-08 DIAGNOSIS — I2510 Atherosclerotic heart disease of native coronary artery without angina pectoris: Secondary | ICD-10-CM | POA: Diagnosis not present

## 2018-04-08 DIAGNOSIS — E119 Type 2 diabetes mellitus without complications: Secondary | ICD-10-CM | POA: Diagnosis not present

## 2018-04-08 DIAGNOSIS — I5020 Unspecified systolic (congestive) heart failure: Secondary | ICD-10-CM | POA: Diagnosis not present

## 2018-04-08 DIAGNOSIS — C50912 Malignant neoplasm of unspecified site of left female breast: Secondary | ICD-10-CM | POA: Diagnosis not present

## 2018-04-09 ENCOUNTER — Encounter: Payer: Self-pay | Admitting: *Deleted

## 2018-04-09 ENCOUNTER — Telehealth: Payer: Self-pay

## 2018-04-09 ENCOUNTER — Other Ambulatory Visit: Payer: Self-pay | Admitting: *Deleted

## 2018-04-09 LAB — CULTURE, BLOOD (ROUTINE X 2)
CULTURE: NO GROWTH
Culture: NO GROWTH

## 2018-04-09 NOTE — Discharge Summary (Signed)
[facility] Discharge Summary  [name] Medical record number: [id number] Date of birth: 1924-09-03 Age: 84 y.o. Gender: female Date of Admission: 04/03/2018  Date of Discharge: 04/06/2018 Admitting Physician: Kimberly Bing, DO  Primary Care Provider: Kerr Bing, DO Consultants: none  Indication for Hospitalization: Altered mental status 2/2 UTI  Discharge Diagnoses/Problem List:  Altered mental status Acute cystitis Hypercalcemia Hypokalemia Hypertension Hypokalemia Heart failure with preserved ejection fraction Non-insulin dependent diabetes mellitus History of bilateral breast and colon cancer  Disposition: home  Discharge Condition: stalble  Discharge Exam: General:elderly African American female resting comfortably in bed, No Acute Distress Cardiovascular: regular rate rhythm, no murmurs/rubs/gallops, palpable dp/pt bilaterally Lungs: Clear to auscultation bilaterally with normal work of breathing Abdomen: soft, non-tender, non-distended, normoactive bowel sounds Skin: warm, dry, no rashes or lesions, cap refill < 2 seconds Extremities: warm and well perfused, normal tone, no edema Psych: euthymic mood, congruent affect Neuro: moves all 4 extremities spontaneously, no facial droop or dyarthria  Brief Hospital Course:  84 year old female who presented on 5/23 for slight altered mental status in setting of UA with nitrites and leukocytes.  Urinary tract infection Started on ceftriaxone at admission. Urine cultures grew out 100k CFU of ecoli. She was transitioned to oral keflex on 5/24 to complete a 7 day course. Patient was afebrile, vital signs within normal limits and felt to be back to her baseline cognition prior to discharge.  Altered mental status Patient with perhaps minimal altered mental status at admission. Felt to be back to baseline shortly after admission. Did have [place] performed by SLP on day of  discharge which was 5/19. Out of 19 because patient is unable to read or write. Consider dementia workup as outpatient.  Hypokalemia K was 2.9 on arrival. She is known to have chronic hypokalemia. Repleted while in hospital. K 3.8 on discharge.  Issues for Follow Up:  1. Follow up completion of antibiotics course 2. Follow up mental status-> consider workup for dementia given MOCA in hospital was 5/19 3. Follow up blood pressure, consider increasing amlodipine or coreg if elevated 4. Consider rechecking K although this is a chronic problem, so I am not sure of utility.  Significant Procedures:   Significant Labs and Imaging:  Recent Labs  Lab 04/04/18 0430 04/05/18 0723 04/06/18 0415  WBC 4.0 4.9 5.3  HGB 9.0* 10.1* 10.2*  HCT 30.1* 32.5* 33.5*  PLT PLATELET CLUMPS NOTED ON SMEAR, COUNT APPEARS ADEQUATE 207 187   Recent Labs  Lab 04/03/18 2326 04/04/18 0430 04/05/18 0723 04/06/18 0415  NA 141 142  --   --   K 2.9* 2.9* 3.2* 3.8  CL 106 108  --   --   CO2 27 25  --   --   GLUCOSE 154* 131*  --   --   BUN 11 9  --   --   CREATININE 0.91 0.88  --   --   CALCIUM 11.0* 10.4*  --   --   MG 1.6*  --   --   --   ALKPHOS 49  --   --   --   AST 13*  --   --   --   ALT 6*  --   --   --   ALBUMIN 3.1*  --   --   --      Results/Tests Pending at Time of Discharge:   Discharge Medications:  Allergies as of 04/06/2018  Reactions   Tape Other (See Comments)   Skin is very thin and will tear and bruise easily!!   Lisinopril Cough      Medication List    TAKE these medications   acetaminophen 500 MG tablet Commonly known as:  TYLENOL Take 500 mg by mouth every 6 (six) hours as needed for moderate pain.   albuterol 108 (90 Base) MCG/ACT inhaler Commonly known as:  PROVENTIL HFA;VENTOLIN HFA Inhale 2 puffs into the lungs every 6 (six) hours as needed. For shortness of breath   amLODipine 5 MG tablet Commonly known as:  NORVASC Take 1 tablet (5 mg total) by mouth  daily.   ASPIRIN CHILDRENS 81 MG chewable tablet Generic drug:  aspirin Chew 81 mg by mouth daily.   capsaicin 0.025 % cream Commonly known as:  ZOSTRIX APPLY TO AFFECTED AREA TWICE A DAY   carvedilol 12.5 MG tablet Commonly known as:  COREG TAKE 1 TABLET BY MOUTH TWICE A DAY WITH A MEAL What changed:  See the new instructions.   cephALEXin 500 MG capsule Commonly known as:  KEFLEX Take 1 capsule (500 mg total) by mouth 2 (two) times daily for 5 days.   feeding supplement (PRO-STAT SUGAR FREE 64) Liqd Take 30 mLs by mouth 2 (two) times daily with a meal. ensure   ferrous sulfate 325 (65 FE) MG tablet Take 1 tablet (325 mg total) by mouth daily with breakfast.   guaifenesin 100 MG/5ML syrup Commonly known as:  ROBITUSSIN Take 10 mLs (200 mg total) by mouth 3 (three) times daily as needed for cough.   hydroxypropyl methylcellulose / hypromellose 2.5 % ophthalmic solution Commonly known as:  ISOPTO TEARS / GONIOVISC Place 1 drop 3 (three) times daily into both eyes.   loratadine 10 MG tablet Commonly known as:  CLARITIN TAKE 1/2 TABLET BY MOUTH DAILY   losartan 50 MG tablet Commonly known as:  COZAAR TAKE 1 TABLET (50 MG TOTAL) BY MOUTH AT BEDTIME.   metFORMIN 500 MG tablet Commonly known as:  GLUCOPHAGE Take 1 tablet (500 mg total) by mouth daily with breakfast.   PATADAY 0.2 % Soln Generic drug:  Olopatadine HCl APPLY 1 DROP TO EYE DAILY.   petrolatum-hydrophilic-aloe vera ointment Apply topically 2 (two) times daily as needed for wound care.   ranitidine 150 MG tablet Commonly known as:  ZANTAC TAKE 1 TABLET BY MOUTH TWICE A DAY What changed:    how much to take  how to take this  when to take this   rosuvastatin 20 MG tablet Commonly known as:  CRESTOR TAKE 1 TABLET BY MOUTH EVERYDAY AT BEDTIME   senna-docusate 8.6-50 MG tablet Commonly known as:  Senokot-S Take 2 tablets by mouth at bedtime. What changed:    how much to take  when to take  this   silver sulfADIAZINE 1 % cream Commonly known as:  SILVADENE Apply 1 application topically daily.   traMADol 50 MG tablet Commonly known as:  ULTRAM Take 1 tablet (50 mg total) by mouth every 8 (eight) hours as needed for severe pain.       Discharge Instructions: Please refer to Patient Instructions section of EMR for full details.  Patient was counseled important signs and symptoms that should prompt return to medical care, changes in medications, dietary instructions, activity restrictions, and follow up appointments.   Follow-Up Appointments: Follow-up Information    [facility] Follow up.   Specialty:  Home Health Services Why:  A representative  from [place] will contact you to resume your Home Health services.  Contact information: [address] [place] [address] [phone]        [place] Bussing, MD. Go on 04/10/2018.   Specialty:  Family Medicine Why:  Please go to a follow up appointment with Dr. Ola Spurr on 5/29, @ 930 Contact information: [place] [address] [phone]           Guadalupe Dawn, MD 04/09/2018, 1:29 PM PGY-1, [place]

## 2018-04-09 NOTE — Telephone Encounter (Signed)
This encounter was created in error - please disregard.

## 2018-04-09 NOTE — Telephone Encounter (Signed)
[place] with [facility] calling for verbal orders for patient to have SW Evaluation. Call back [phone] Wallace Cullens, RN

## 2018-04-09 NOTE — Patient Outreach (Signed)
[facility]) Care Management   04/09/2018  AHUVA POYNOR December 22, 1923 [id number]  DEMARIS BOUSQUET is an 84 y.o. female  Subjective:   Member alert and oriented x3 but slightly confused during conversation regarding certain topics.  She report she still has personal care aide coming daily to help with bathing, dressing, preparing meals, and other ADLs.  State she has been compliant with medications.  Objective:   Review of Systems  Constitutional: Negative.   HENT: Negative.   Eyes: Negative.   Respiratory: Negative.   Cardiovascular: Negative.   Gastrointestinal: Negative.   Genitourinary: Negative.   Musculoskeletal: Negative.   Skin: Negative.   Neurological: Negative.   Endo/Heme/Allergies: Negative.   Psychiatric/Behavioral: Negative.     Physical Exam  Constitutional: She is oriented to person, place, and time. She appears well-developed and well-nourished.  Neck: Normal range of motion.  Cardiovascular: Normal rate, regular rhythm and normal heart sounds.  Respiratory: Effort normal and breath sounds normal.  GI: Soft. Bowel sounds are normal.  Neurological: She is alert and oriented to person, place, and time.  Skin: Skin is warm and dry.   BP (!) 142/64 (BP Location: Right Arm, Patient Position: Sitting, Cuff Size: Normal)   Pulse 74   Resp 18   SpO2 98%   Encounter Medications:   Outpatient Encounter Medications as of 04/09/2018  Medication Sig  . acetaminophen (TYLENOL) 500 MG tablet Take 500 mg by mouth every 6 (six) hours as needed for moderate pain.   Marland Kitchen amLODipine (NORVASC) 5 MG tablet Take 1 tablet (5 mg total) by mouth daily.  Marland Kitchen aspirin (ASPIRIN CHILDRENS) 81 MG chewable tablet Chew 81 mg by mouth daily.   . capsaicin (ZOSTRIX) 0.025 % cream APPLY TO AFFECTED AREA TWICE A DAY  . carvedilol (COREG) 12.5 MG tablet TAKE 1 TABLET BY MOUTH TWICE A DAY WITH A MEAL (Patient taking differently: Take 12.5 mg by mouth two times a day with a meal)  .  cephALEXin (KEFLEX) 500 MG capsule Take 1 capsule (500 mg total) by mouth 2 (two) times daily for 5 days.  . ferrous sulfate 325 (65 FE) MG tablet Take 1 tablet (325 mg total) by mouth daily with breakfast.  . guaifenesin (ROBITUSSIN) 100 MG/5ML syrup Take 10 mLs (200 mg total) by mouth 3 (three) times daily as needed for cough.  . hydroxypropyl methylcellulose / hypromellose (ISOPTO TEARS / GONIOVISC) 2.5 % ophthalmic solution Place 1 drop 3 (three) times daily into both eyes.  Marland Kitchen loratadine (CLARITIN) 10 MG tablet TAKE 1/2 TABLET BY MOUTH DAILY  . losartan (COZAAR) 50 MG tablet TAKE 1 TABLET (50 MG TOTAL) BY MOUTH AT BEDTIME.  . metFORMIN (GLUCOPHAGE) 500 MG tablet Take 1 tablet (500 mg total) by mouth daily with breakfast.  . PATADAY 0.2 % SOLN APPLY 1 DROP TO EYE DAILY.  Marland Kitchen petrolatum-hydrophilic-aloe vera (ALOE VESTA) ointment Apply topically 2 (two) times daily as needed for wound care.  . ranitidine (ZANTAC) 150 MG tablet TAKE 1 TABLET BY MOUTH TWICE A DAY (Patient taking differently: '150MG'$  BY MOUTH TWICE DAILY)  . rosuvastatin (CRESTOR) 20 MG tablet TAKE 1 TABLET BY MOUTH EVERYDAY AT BEDTIME  . senna-docusate (SENOKOT-S) 8.6-50 MG tablet Take 2 tablets by mouth at bedtime. (Patient taking differently: Take 1 tablet by mouth every other day. )  . silver sulfADIAZINE (SILVADENE) 1 % cream Apply 1 application topically daily.  Marland Kitchen albuterol (PROVENTIL HFA;VENTOLIN HFA) 108 (90 BASE) MCG/ACT inhaler Inhale 2 puffs into the lungs  every 6 (six) hours as needed. For shortness of breath (Patient not taking: Reported on 03/11/2018)  . Amino Acids-Protein Hydrolys (FEEDING SUPPLEMENT, PRO-STAT SUGAR FREE 64,) LIQD Take 30 mLs by mouth 2 (two) times daily with a meal. ensure  . traMADol (ULTRAM) 50 MG tablet Take 1 tablet (50 mg total) by mouth every 8 (eight) hours as needed for severe pain. (Patient not taking: Reported on 04/09/2018)  . [DISCONTINUED] cetirizine (ZYRTEC) 5 MG tablet Take 1 tablet (5 mg  total) by mouth daily. Take at night time as it can cause some drowsiness   No facility-administered encounter medications on file as of 04/09/2018.     Functional Status:   In your present state of health, do you have any difficulty performing the following activities: 03/11/2018 11/25/2017  Hearing? N N  Vision? Y N  Difficulty concentrating or making decisions? N N  Walking or climbing stairs? Y Y  Dressing or bathing? Y Y  Doing errands, shopping? Tempie Donning  Preparing Food and eating ? Y -  Using the Toilet? N -  In the past six months, have you accidently leaked urine? N -  Do you have problems with loss of bowel control? N -  Managing your Medications? Y -  Managing your Finances? Y -  Housekeeping or managing your Housekeeping? Y -  Some recent data might be hidden    Fall/Depression Screening:    Fall Risk  03/11/2018 12/03/2017 09/28/2017  Falls in the past year? No No No  Number falls in past yr: - - -  Injury with Fall? - - -  Risk Factor Category  - - -  Risk for fall due to : - - -  Follow up - - -   PHQ 2/9 Scores 03/11/2018 12/03/2017 09/28/2017 08/09/2017 05/02/2017 04/23/2017 02/07/2017  PHQ - 2 Score 1 0 0 0 0 0 0  PHQ- 9 Score - - - - - - -  Exception Documentation - - - - - - -  Not completed - - - - - - -    Assessment:    Met with member at scheduled time, daughter Hassan Rowan present during visit.  Member appears well, no distress noted.  She state she was told she was admitted to hospital for UTI, but do not believe she had one.  Primary MD office will complete transition of care assessment.  She does report there was "some" pneumonia on chest xray, was discharged with antibiotics.  Hospital follow up scheduled for tomorrow, she denies having transportation.  Daughter and son present, state member could use SCAT.    Call placed to SCAT to secure transportation, however member is not active and will need to complete application.  Family aware, will place referral to LCSW.   Call to MD office, visit for tomorrow canceled, will keep one for 6/14.  Daughter Hassan Rowan state she will be able to provide transportation on that day.  Denies any further questions, advised to contact with concerns.  Plan:   Will place referral for LCSW regarding transportation resources. Will follow up with member next month.  [facility] CM Care Plan Problem One     Most Recent Value  Care Plan Problem One  Recent ED visit related to pneumonia  Role Documenting the Problem One  Care Management [place] for Problem One  Active  [facility] Long Term Goal   Member will not have hospital admission or ED visits within the next 31 days  [facility] Long  Term Goal Start Date  03/11/18  [facility] Long Term Goal Met Date  -- [Not met, ED visit on 5/22 resulting in admission]  Interventions for Problem One Long Term Goal  Discussed with member the importance of following discharge instructions, including follow up appointments, medications, diet, and home health involvement, to decrease the risk of readmission  [facility] CM Short Term Goal #1   Member will report compliance with home health for physical therapy over the next 4 weeks  [facility] CM Short Term Goal #1 Start Date  03/11/18  [facility] CM Short Term Goal #1 Met Date  04/09/18  [facility] CM Short Term Goal #2   Member will report increase in strength, will be able to walk to bathroom/kitchen within the next 4 weeks  [facility] CM Short Term Goal #2 Start Date  03/11/18  [facility] CM Short Term Goal #2 Met Date  -- [Not met]    [facility] CM Care Plan Problem Two     Most Recent Value  Care Plan Problem Two  Risk for readmission as evidenced by recent hospitalization   Role Documenting the Problem Two  Care Management [place] for Problem Two  Active  [facility] Long Term Goal  Member will not be admitted to hospital within the next 31 days  [place] Term Goal Start Date  04/09/18  [facility] CM Short Term Goal #1   Member will keep and attend follow up with primary MD within the next 3  weeks  [facility] CM Short Term Goal #1 Start Date  04/09/18  Interventions for Short Term Goal #2   Call placed to cancel visit for tomorrow, confirmed visit scheduled for 6/14.  Referral placed to LCSW for transportation resources to use as back up plan.  [facility] CM Short Term Goal #2   Member will report compliance with home health PT over the next 4 weeks  [facility] CM Short Term Goal #2 Start Date  04/09/18     [name], RN, MSN [place] Manager [phone]

## 2018-04-10 ENCOUNTER — Ambulatory Visit: Payer: Self-pay | Admitting: Internal Medicine

## 2018-04-10 NOTE — Telephone Encounter (Signed)
[name]- RN with [facility] calling for verbal orders to resume nursing care for 3 weeks. Call back [phone] Wallace Cullens, RN

## 2018-04-10 NOTE — Telephone Encounter (Signed)
Verbal orders placed.  David McMullen, DO [facility], PGY-2  

## 2018-04-11 IMAGING — CR DG CHEST 2V
2 series · 2 of 2 positions shown · non-contrast
Comparison: PA and lateral chest x-ray November 25, 2017

CLINICAL DATA: Cough and chest pain

EXAM:
CHEST - 2 VIEW

[chest lat]
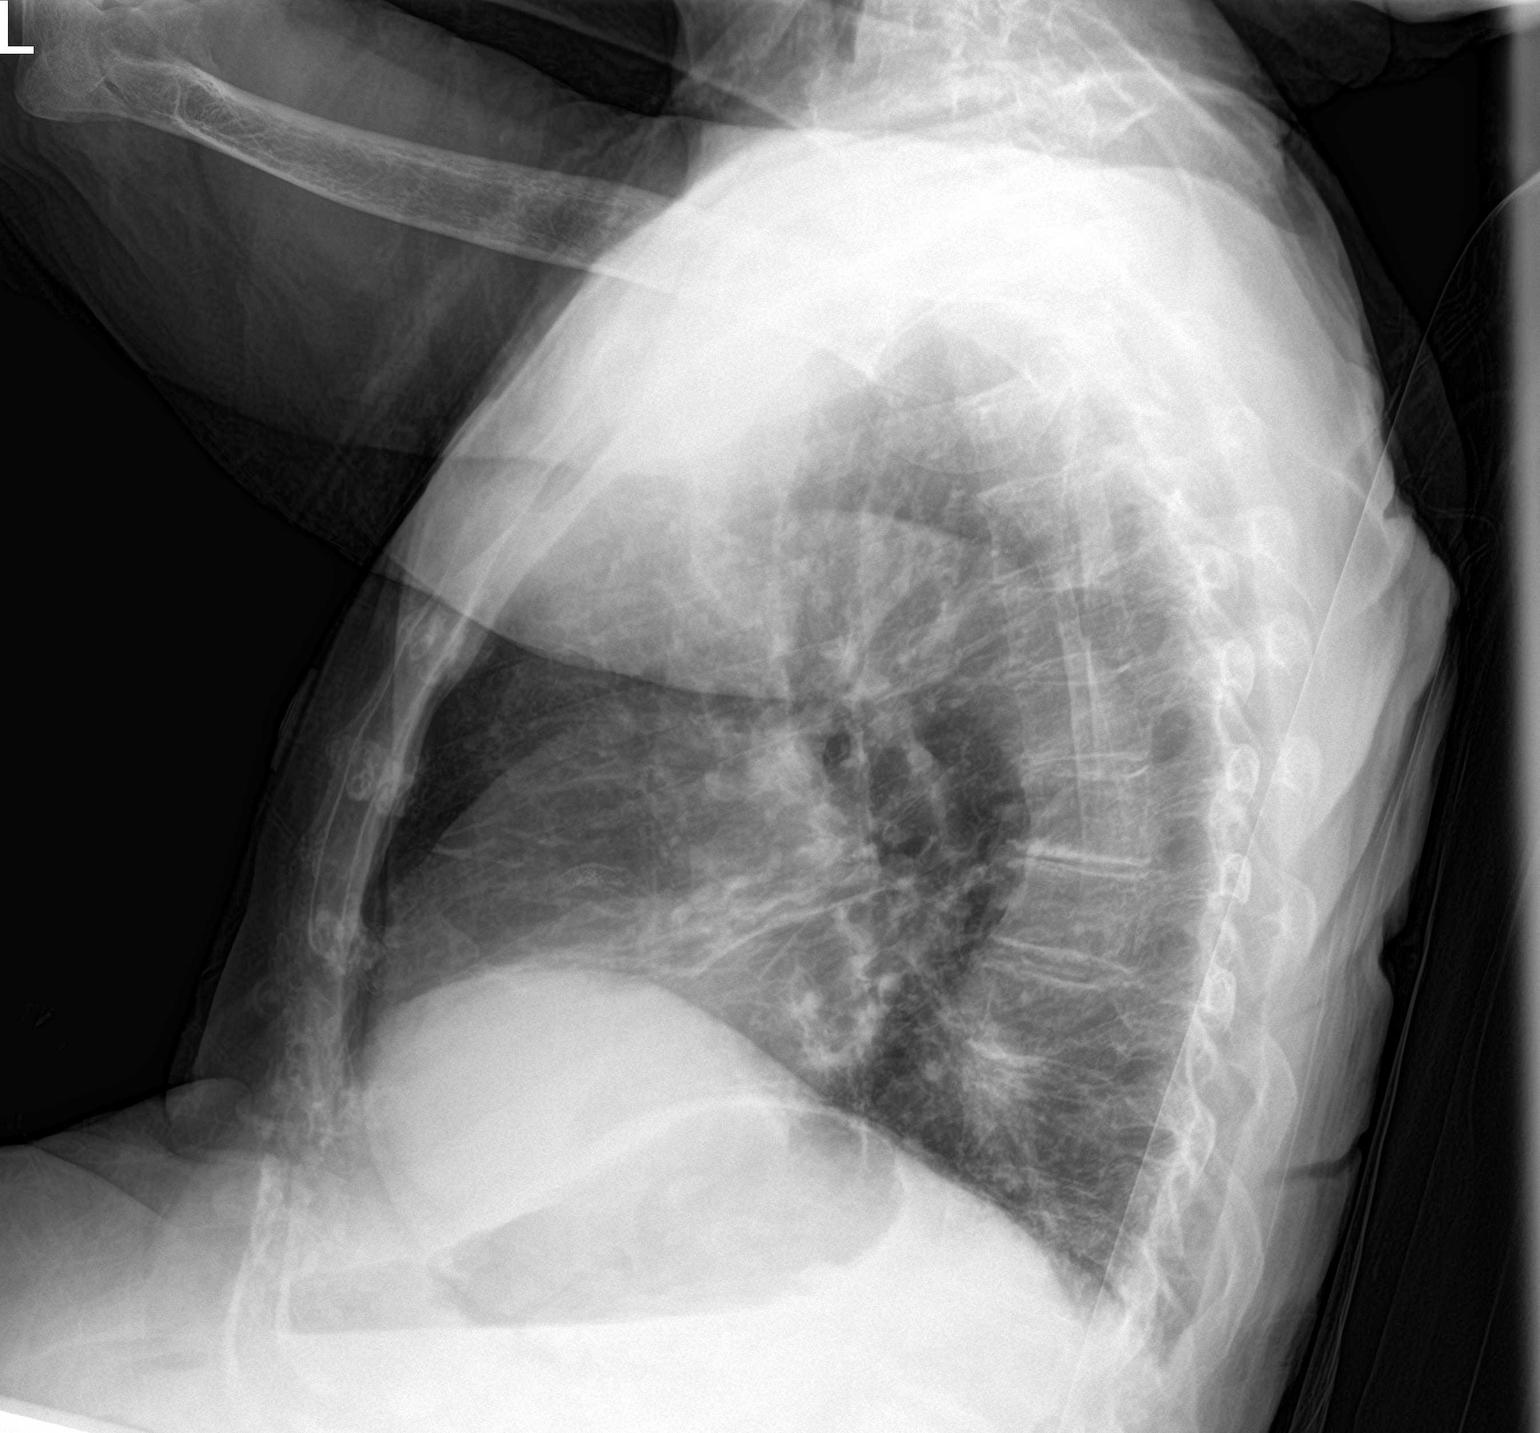

[chest ap]
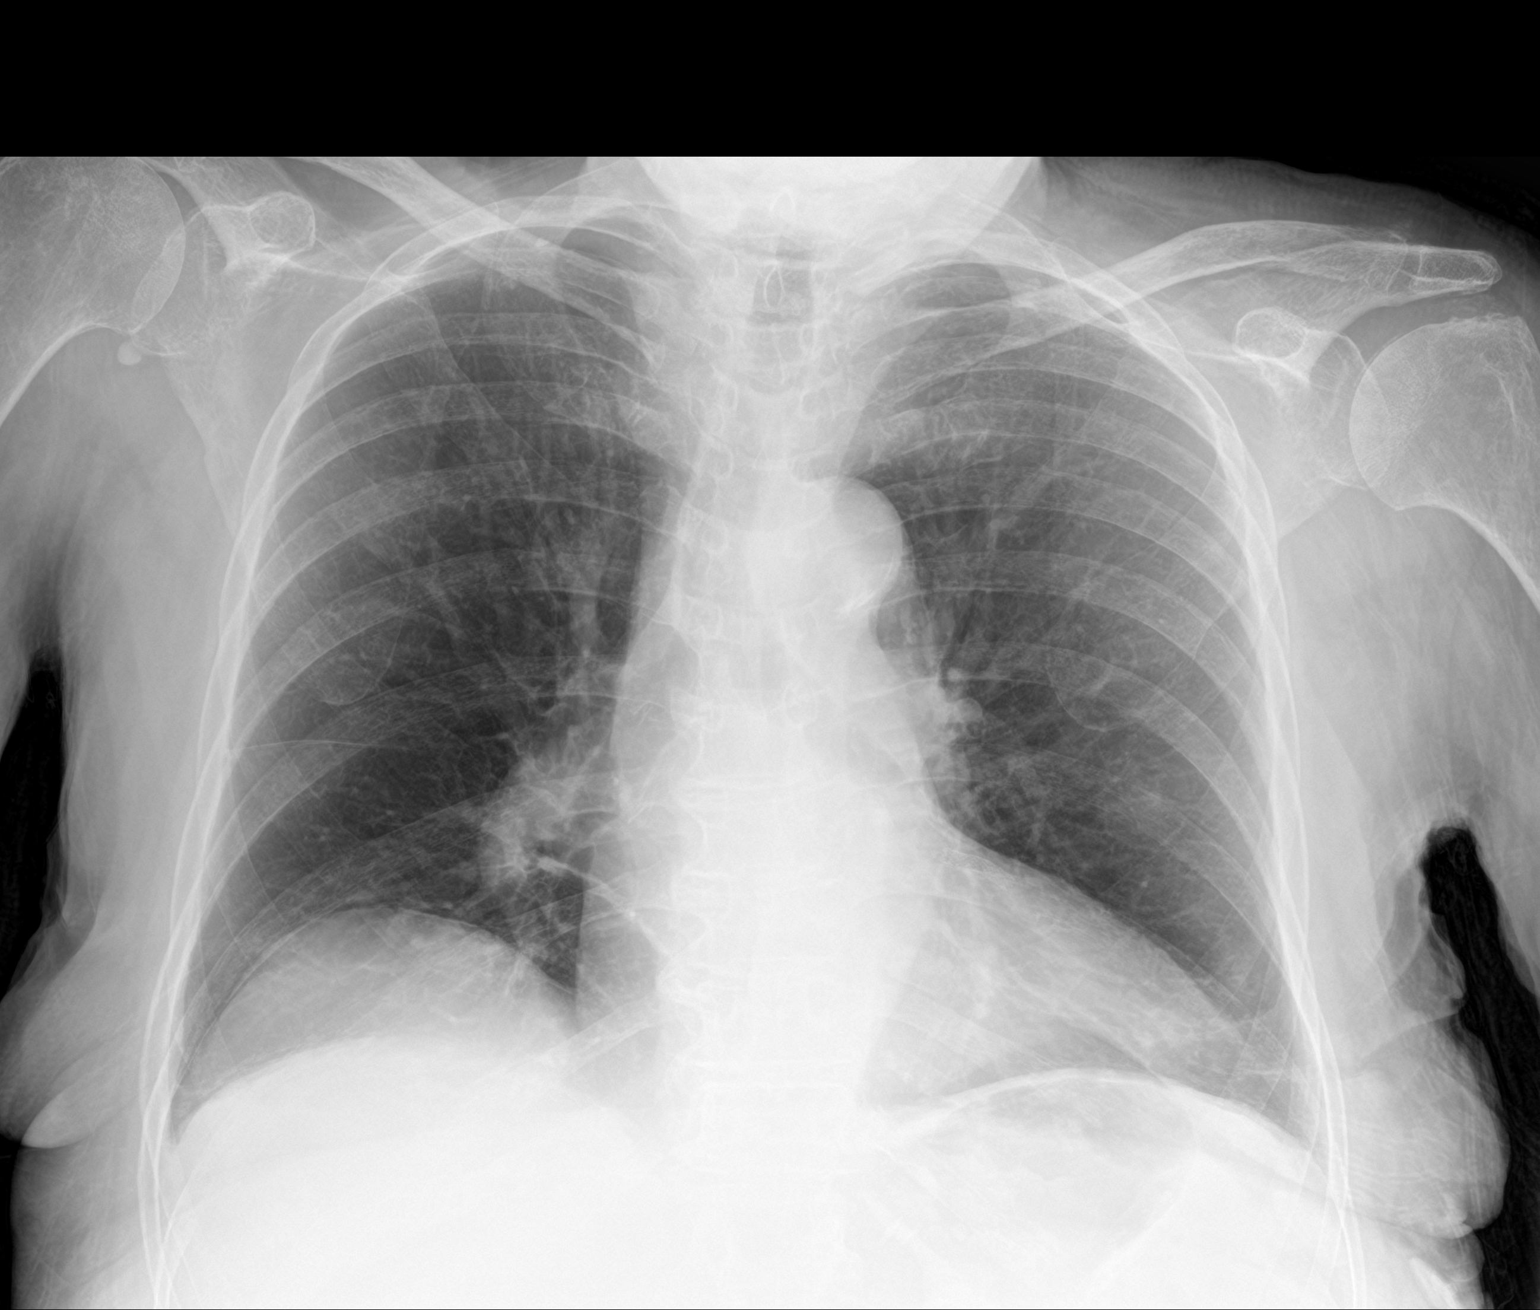

[2 of 2 positions shown; findings below may reference images not displayed]

FINDINGS: The lungs are adequately inflated. The lung markings are coarse at
both bases and more conspicuous than on the previous study. There is
no pleural effusion. The heart is top-normal in size. The pulmonary
vascularity is normal. There is calcification in the wall of the
aortic arch. There is mild multilevel degenerative disc disease of
the thoracic spine.
IMPRESSION: Bibasilar atelectasis or developing pneumonia demonstrated best on
the lateral view. Followup PA and lateral chest X-ray is recommended
in 3-4 weeks following trial of antibiotic therapy to ensure
resolution and exclude underlying malignancy.

Thoracic aortic atherosclerosis.

## 2018-04-14 ENCOUNTER — Encounter: Payer: Self-pay | Admitting: *Deleted

## 2018-04-16 ENCOUNTER — Other Ambulatory Visit: Payer: Self-pay

## 2018-04-16 DIAGNOSIS — I2510 Atherosclerotic heart disease of native coronary artery without angina pectoris: Secondary | ICD-10-CM | POA: Diagnosis not present

## 2018-04-16 DIAGNOSIS — I5020 Unspecified systolic (congestive) heart failure: Secondary | ICD-10-CM | POA: Diagnosis not present

## 2018-04-16 DIAGNOSIS — C50912 Malignant neoplasm of unspecified site of left female breast: Secondary | ICD-10-CM | POA: Diagnosis not present

## 2018-04-16 DIAGNOSIS — E119 Type 2 diabetes mellitus without complications: Secondary | ICD-10-CM | POA: Diagnosis not present

## 2018-04-16 DIAGNOSIS — I110 Hypertensive heart disease with heart failure: Secondary | ICD-10-CM | POA: Diagnosis not present

## 2018-04-16 DIAGNOSIS — J189 Pneumonia, unspecified organism: Secondary | ICD-10-CM | POA: Diagnosis not present

## 2018-04-16 NOTE — Patient Outreach (Signed)
[facility]) Care Management  04/16/2018  [name] [birth year] [id number]   BSW contacted patient in response to social work referral for transportation resources.  BSW completed SCAT application via phone and faxed to eligibility.  BSW informed patient that SCAT will be contacting her to schedule face to face interview and that they can provide transportation to this interview if requested.  BSW will follow up with patient next week to determine status of applications.  Ronn Melena, BSW Social Worker [phone]

## 2018-04-17 ENCOUNTER — Telehealth: Payer: Self-pay | Admitting: *Deleted

## 2018-04-17 NOTE — Progress Notes (Signed)
Type of Service: Clinical Social Work  LCSW returned phone call from patient's daughter, states wants information about placement for patient.  Indicated her sister Angle is getting stressed out with caring for patient.   The following was discussed placement process and payment options; POA, [facility], [facility].  Long-term care placement verses SNF placement for rehab. Facility options for long-term care, information for legal counsel via Scientist, physiological. Patient's daughter appreciative of information, indicated she would talk with siblings.   LCSW called [name], [facility] RN for care coordination.  Monica met with patient and two of her daughters ( Angle and Hassan Rowan), Family has not requested placement and this is new information for [place].      Plan: Brayton Layman will involve [place] social worker, [facility] will reach out to this LCSW if assistance is needed for additional care coordination.   Casimer Lanius, LCSW Licensed Clinical Social Worker [facility]   [phone] 12:27 PM

## 2018-04-17 NOTE — Telephone Encounter (Signed)
Patients daughter, [name], would like for the social worker to call her. She has questions about placing her mom in a SNF.  She is faxing over forms she found online for Dr. Yisroel Ramming and Neoma Laming to review.  [name], [name], CMA

## 2018-04-18 NOTE — Telephone Encounter (Signed)
Thank you for taking care of this.  [name], [place], PGY-2

## 2018-04-19 ENCOUNTER — Other Ambulatory Visit: Payer: Self-pay | Admitting: *Deleted

## 2018-04-19 NOTE — Patient Outreach (Signed)
[facility]) Care Management  04/19/2018  [name] 06/15/1924 [id number]   Notified by D. Laurance Flatten, LCSW with member's primary MD office, that member's daughter [name]) contacted her regarding assistance/information for placement.  Home visit complete with member on 5/28, spoke with daughters Hassan Rowan & [name] (member lives with [place]), no discussion of placement.  LCSW with [facility] updated on new request, will attempt to plan joint visit to have discussion with member and family.  Call placed to [facility] daughter [name], no answer.  HIPAA compliant voice message left.  Will follow up within the next week.  Will update LCSW and have her make attempt to speak with family as well.  [name], [place], MSN [place] [phone]

## 2018-04-22 ENCOUNTER — Other Ambulatory Visit: Payer: Self-pay | Admitting: *Deleted

## 2018-04-22 ENCOUNTER — Encounter: Payer: Self-pay | Admitting: *Deleted

## 2018-04-22 NOTE — Patient Outreach (Signed)
[facility]) Care Management  04/22/2018  BRENNYN ORTLIEB [birth year] [id number]   CSW made an initial attempt to try and contact patient's daughter, [name] today to perform the initial phone assessment on patient, as well as assess and assist with social work needs and services, without success.  A HIPAA compliant message was left for [facility] on voicemail.  CSW is currently awaiting a return call.  CSW will make a second outreach attempt within the next 3-4 business days, if a return call is not received from [place] in the meantime.  CSW will also mail an Outreach Letter to patient's home requesting that patient and [name] contact CSW if they are interested in receiving social work services through [place] with Scientist, clinical (histocompatibility and immunogenetics). Nat Christen, BSW, MSW, LCSW  Licensed Education officer, environmental Health System  Mailing [place] [address] [place], [place] [address] Physical Address-[address], [place],  [address] Toll Free Main # [phone] Fax # [phone] Cell # [phone]  Office # [phone] [name]

## 2018-04-22 NOTE — Patient Outreach (Signed)
[facility]) Care Management  04/22/2018  [name] Apr 23, 1924 [id number]   Call received from daughter, [name], after voice message left.  This care manager inquired about her sister, [name], contacting MD office requesting information on placement.  [name] state she herself is not interested in placement, but her sisters [name] & [name]) are.  She denies that the member is aware of the request.  [name] is not ware of any one of her siblings having POA.  It is evident that all siblings are not in agreement as far as plan for member, but [name] is willing to have conversation with her siblings and member to discuss options.  LCSW updated, provided with contact information for all daughters.  Will continue to collaborate with LCSW & family for to establish plan of care.  [name], [place], MSN [place] [phone]

## 2018-04-23 ENCOUNTER — Other Ambulatory Visit: Payer: Self-pay | Admitting: *Deleted

## 2018-04-23 ENCOUNTER — Encounter: Payer: Self-pay | Admitting: *Deleted

## 2018-04-23 ENCOUNTER — Telehealth: Payer: Self-pay

## 2018-04-23 ENCOUNTER — Other Ambulatory Visit: Payer: Self-pay

## 2018-04-23 NOTE — Patient Outreach (Signed)
[facility]) Care Management  04/23/2018  [name] April 26, 1924 [id number]   Follow up call to patient regarding SCAT application submitted on 04/16/18.  Voicemail message left at [phone].  Patient's daughter, [name], answered at [phone].  Ms. Aretha Parrot reported that patient was contacted by SCAT but they need to call back to schedule the face to face interview.  Ms. Aretha Parrot confirmed that message from LCSW, Nat Christen, was received and she intends to call her back.  BSW will follow up again next week regarding status of SCAT.  Ronn Melena, BSW Social Worker [phone]

## 2018-04-23 NOTE — Telephone Encounter (Signed)
[name], RN with [facility], called to make PCP aware that nursing orders were up yesterday and there is a pending OT and PT evaluation, however, [facility] is doing an agency discharge due to a bedbug infestation in the home. Patient and family aware they will not be seen until this is resolved.  Call back is [phone]  Danley Danker, RN [facility])

## 2018-04-23 NOTE — Patient Outreach (Signed)
[facility]) Care Management  04/23/2018  [name] 06/07/1924 [id number]   CSW received a return call from patient's daughter, [name] today, in response to the HIPAA compliant message left for Mrs. [name] on voicemail on Monday, April 22, 2018.  CSW was able to perform the initial phone assessment on patient, as well as assess and assist with social work needs and services.  CSW introduced self, explained role and types of services provided through [place] [facility] ([place] [facility]).  CSW further explained to Mrs. [name] that CSW works with patient's RNCM, also with [place] Management, [name]. CSW then explained the reason for the call, indicating that Mrs. [name] thought that patient would benefit from social work services and resources to assist with determining whether or not patient is ready to be placed into an assisted living facility for long-term care services.  CSW obtained two HIPAA compliant identifiers from Mrs. [name] which included patient's name and date of birth.  Mrs. [name] admitted that she has had somewhat of a "falling out" with her two sisters, [name] and [name].  Mrs. [name] went on to say that she is currently providing 24 hour care and supervision to patient, 7 days per week, after having moved patient into her home.  Mrs. [name] reported that her sisters are only giving her $200.00 per month to care for patient, which is not nearly enough for her to live on.  Mrs. [name] explained to her sisters that she would need to take a part-time job on the weekends, if they do not plan to pay her more, leaving them responsible for caring for patient on the weekends.  [name] and [name] were not in agreement with this plan, reporting that they would make arrangements to have patient placed into a long-term care assisted living facility.  Mrs. [name] reported that she would much rather care for patient in the  home, knowing she will receive the best care possible, but that she simply needs more money.  CSW agreed to contact Ms. [name] and [name] to see about scheduling a family meeting with all parties involved.  CSW made an attempt to try and contact Ms. [name] and [name] today to see about scheduling an initial home visit with patient to discuss patient's plan of care; however, neither were available at the time of CSW's call.  A HIPAA compliant message was left for Ms. [name] and Mrs. [name] on voicemail and CSW is currently awaiting a return call.  CSW will contact Mrs. [name] again by the end of the week if a return call is not received from Ms. [name] and Mrs. [name] in the meantime.  [facility] CM Care Plan Problem One     Most Recent Value  Care Plan Problem One  Family discord regarding long-term care assisted living placement for patient.  Role Documenting the Problem One  Clinical Social Worker  Care Plan for Problem One  Active  [facility] CM Short Term Goal #1   Patient and patient's three daughters will meet with CSW for an initial home visit to determine whether or not patient will remain at home with her daughter versus placement into an assisted living facility for long-term care services.  [facility] CM Short Term Goal #1 Start Date  04/23/18  Interventions for Short Term Goal #1  CSW will contact patient and three daughters to schedule an initial home visit.    [name], BSW, MSW, [employer]  Licensed Clinical Social Worker  [place]  Mailing [place] [address] [place], [place] [address] Physical Address-[address], [place], [place] [address] Toll Free Main # [phone] Fax # [phone] Cell # [phone]  Office # [phone] [name]

## 2018-04-24 ENCOUNTER — Other Ambulatory Visit: Payer: Self-pay | Admitting: *Deleted

## 2018-04-24 NOTE — Telephone Encounter (Signed)
Phone call from daughter, Amina Menchaca, who would like home health orders re-instated. Informed her of [place] phone call and she states that the bedbugs are taken care of and would like new orders sent for home health.  Call back is [phone]  Danley Danker, RN [facility])

## 2018-04-24 NOTE — Patient Outreach (Signed)
[facility]) Care Management  04/24/2018  [name] Aug 25, 1924 [id number]   CSW received a return call from patient's daughter, Jasime Westergren today, in response to the HIPAA compliant message left for Ms. Oats on voicemail on Tuesday, April 23, 2018.  Ms. Byrum apologized for the "misunderstanding", reporting that she has since talked with her sister's, [name] and [name] and that they have decided that patient will remain in the home with [name], having [name] provide 24 hour care and supervision.  Ms. Albin went on to say that they never intended to take patient out of the home and place her into an assisted living facility, they were just wanting to know what their options were.  Ms. Hendler, Mrs. Marcelline Deist and [name] have since agreed that [name] will receive more money from patient's Social Security check each month, not only to pay for patient's expenses, but to also give [name] some additional income so that she does not have to get a part-time job on the weekends and can care for patient.    CSW was able to make contact with [name] to confirm all of the above information.  [name] agreed that a family meeting with her sisters no longer needs to take place.  [name] agreed that she will continue to provide 24 hour care and supervision to patient.  CSW will perform a case closure on patient, as all goals of treatment have been met from social work standpoint and no additional social work needs have been identified at this time.  CSW will notify patient's RNCM with [place] [facility], [name] of CSW's plans to close patient's case.  CSW will fax an update to patient's Primary Care Physician, Dr. [name] to ensure that they are aware of CSW's involvement with patient's plan of care.  [name] will continue to provide disease management services to patient and [employer], social work Social worker,  also with [employer], will continue to assist patient with transportation needs.  [facility] CM Care Plan Problem One     Most Recent Value  Care Plan Problem One  Family discord regarding long-term care assisted living placement for patient.  Role Documenting the Problem One  Clinical Social Worker  Care Plan for Problem One  Active  [facility] CM Short Term Goal #1   Patient and patient's three daughters will meet with CSW for an initial home visit to determine whether or not patient will remain at home with her daughter versus placement into an assisted living facility for long-term care services.  [facility] CM Short Term Goal #1 Start Date  04/23/18  [facility] CM Short Term Goal #1 Met Date  04/24/18  Interventions for Short Term Goal #1  Family discord has been resolved according to two of patient's daughters.    Nat Christen, BSW, MSW, LCSW  Licensed Education officer, environmental Health System  Mailing [place] [address], [place], [place] [address] Physical Address-[address], [place], [place] [address] Toll Free Main # [phone] Fax # [phone] Cell # [phone]  Office # [phone] [name]

## 2018-04-25 ENCOUNTER — Other Ambulatory Visit: Payer: Self-pay | Admitting: Family Medicine

## 2018-04-25 ENCOUNTER — Ambulatory Visit: Payer: Self-pay | Admitting: *Deleted

## 2018-04-25 ENCOUNTER — Other Ambulatory Visit: Payer: Self-pay | Admitting: *Deleted

## 2018-04-25 DIAGNOSIS — R5381 Other malaise: Secondary | ICD-10-CM

## 2018-04-25 NOTE — Patient Outreach (Signed)
[place] [facility]) Care Management  04/25/2018  [name] 10-Jun-1924 [id number]   Call placed to member's [name] to follow up on contact with LCSW as this care manager was notified that family will no longer be seeking placement.  HIPAA compliant voice message left.  Will await call back.     Update:  Call received back from [name].  State she is aware that member will remain home with her.  She denies any urgent concerns at this time.  Home visit scheduled for within the next 2 weeks.  [name], [place], MSN [place] [phone]

## 2018-04-25 NOTE — Telephone Encounter (Signed)
Home health orders placed.  [name], [place], PGY-2

## 2018-04-26 ENCOUNTER — Other Ambulatory Visit: Payer: Self-pay

## 2018-04-26 ENCOUNTER — Encounter: Payer: Self-pay | Admitting: Family Medicine

## 2018-04-26 ENCOUNTER — Ambulatory Visit (INDEPENDENT_AMBULATORY_CARE_PROVIDER_SITE_OTHER): Payer: Medicare Other | Admitting: Family Medicine

## 2018-04-26 VITALS — BP 128/78 | HR 88 | Temp 98.7°F | Ht 59.0 in | Wt 110.0 lb

## 2018-04-26 DIAGNOSIS — N3000 Acute cystitis without hematuria: Secondary | ICD-10-CM

## 2018-04-26 DIAGNOSIS — E119 Type 2 diabetes mellitus without complications: Secondary | ICD-10-CM

## 2018-04-26 DIAGNOSIS — Z7409 Other reduced mobility: Secondary | ICD-10-CM

## 2018-04-26 LAB — POCT GLYCOSYLATED HEMOGLOBIN (HGB A1C): HbA1c, POC (controlled diabetic range): 6.8 % (ref 0.0–7.0)

## 2018-04-26 NOTE — Patient Instructions (Signed)
Thank you for coming in to see [place] today. Please see below to review our plan for today's visit.  1.  Your diabetes is well controlled.  I am okay with seeing you every 6 months. 2.  I placed new order for your home health.  You should receive a phone call in the next week.  Make a point to let them know that Levada Dy will be helping you with your care and she is somebody they can call. 3.  We will consider placing you in an assisted living facility if we are unable to provide 24-hour assistance along with physical and occupational therapy.  In the meantime, I think it is reasonable for you to stay at home and receive 24-hour assistance by your daughter.  Please call the clinic at [phone] if your symptoms worsen or you have any concerns. It was our pleasure to serve you.  [name], [place], PGY-2

## 2018-04-26 NOTE — Assessment & Plan Note (Signed)
Chronic.  Patient wheelchair-bound.  Patient is unsafe to transfer out of wheelchair without assistance.  Requiring 24-hour assistance.  Currently in the process of receiving home aide along with PT/OT.  Patient is a good candidate for ALF if she is unable to receive 24-hour assistance by family. - Patient expected to receive follow-up with home health later this week followed by PT/OT

## 2018-04-26 NOTE — Progress Notes (Signed)
Dm  ? ?

## 2018-04-26 NOTE — Assessment & Plan Note (Signed)
Resolved.  Patient completed antibiotic treatment following discharge from hospital.  She remains asymptomatic.  Mentation at baseline. - Discussed importance of adequate hydration

## 2018-04-26 NOTE — Progress Notes (Signed)
   Subjective   Patient ID: [name]    DOB: 05/28/1924, 84 y.o. female   MRN: [id number]  CC: "Hospital follow-up"  HPI: [name] is a 84 y.o. female who presents to clinic today for the following:  Mobility impaired: Patient requiring 24-hour assistance based on physical and occupational therapy evaluation.  She was recently seen by home health who had to discontinue PT/OT due to bedbug infestation.  Per patient, infestation was taking care of by her [name].  PT/OT has been placed again.  Patient feels that her daughter and provide the 24-hour assistance needed.  She is amenable to ALF if needed.  Based on the discussion by social work and patient's [name], ALF is not something the daughter wants though patient does seem to be okay with the idea.  Urinary tract infection: She completed antibiotics following discharge from hospital back on 04/03/2018.  She is back to her baseline and denies symptoms of fevers or chills, nausea or vomiting, abdominal pain, dysuria, frequency, hematuria.  Diabetes: Patient adherent to metformin.  She does not check her CBGs.  ROS: see HPI for pertinent.  [place]: HTN, PAD, h/o vertebrobasilar artery syndrome, HFpEF, hiatal hernia, GERD, tobacco use disorder, h/o b/l-breast cancer, h/o colon cancer, severe protein-calorie malnutrition, gout, IDA.Surgical history tot knee, EGD for esophogeal tear, b/l mastectomy, cardiac cath 2007 severe 3-vessel disease. Family history heart disease.Smoking status reviewed. Medications reviewed.  Objective   BP 128/78   Pulse 88   Temp 98.7 F (37.1 C) (Oral)   Ht 4\' 11"  (1.499 m)   Wt 110 lb (49.9 kg)   SpO2 98%   BMI 22.22 kg/m  Vitals and nursing note reviewed.  General: frail wheelchair-bound elderly female, NAD with non-toxic appearance HEENT: normocephalic, atraumatic, moist mucous membranes Neck: supple, non-tender without lymphadenopathy Cardiovascular: regular rate and rhythm without  murmurs, rubs, or gallops Lungs: clear to auscultation bilaterally with normal work of breathing Abdomen: soft, non-tender, non-distended, normoactive bowel sounds Skin: warm, dry, no rashes or lesions, cap refill < 2 seconds Extremities: warm and well perfused, normal tone, no edema Psych: euthymic mood, congruent affect  Assessment & Plan   Controlled type 2 diabetes mellitus without complication, without long-term current use of insulin (HCC) Chronic.  Controlled, A1c 6.8.  On metformin only. - Continue metformin 5 mg daily - Agreeable to see patient every 6 months for diabetes given long-term control  Acute cystitis Resolved.  Patient completed antibiotic treatment following discharge from hospital.  She remains asymptomatic.  Mentation at baseline. - Discussed importance of adequate hydration  Mobility impaired Chronic.  Patient wheelchair-bound.  Patient is unsafe to transfer out of wheelchair without assistance.  Requiring 24-hour assistance.  Currently in the process of receiving home aide along with PT/OT.  Patient is a good candidate for ALF if she is unable to receive 24-hour assistance by family. - Patient expected to receive follow-up with home health later this week followed by PT/OT  Orders Placed This Encounter  Procedures  . POCT glycosylated hemoglobin (Hb A1C)   No orders of the defined types were placed in this encounter.   [name], [place], PGY-2 04/26/2018, 12:06 PM

## 2018-04-26 NOTE — Assessment & Plan Note (Signed)
Chronic.  Controlled, A1c 6.8.  On metformin only. - Continue metformin 5 mg daily - Agreeable to see patient every 6 months for diabetes given long-term control

## 2018-05-02 ENCOUNTER — Ambulatory Visit: Payer: Self-pay

## 2018-05-02 ENCOUNTER — Other Ambulatory Visit: Payer: Self-pay

## 2018-05-02 DIAGNOSIS — M546 Pain in thoracic spine: Secondary | ICD-10-CM

## 2018-05-02 NOTE — Patient Outreach (Signed)
[facility]) Care Management  05/02/2018  [name] February 23, 1924 [id number]   Follow up call to patient regarding status of SCAT application.  BSW left voicemail message on mobile number.  Attempted to call home number multiple times but it was busy each time.  BSW will make second attempt within four business days.  Ronn Melena, BSW Social Worker [phone]

## 2018-05-03 DIAGNOSIS — Z7984 Long term (current) use of oral hypoglycemic drugs: Secondary | ICD-10-CM | POA: Diagnosis not present

## 2018-05-03 DIAGNOSIS — M109 Gout, unspecified: Secondary | ICD-10-CM | POA: Diagnosis not present

## 2018-05-03 DIAGNOSIS — E1151 Type 2 diabetes mellitus with diabetic peripheral angiopathy without gangrene: Secondary | ICD-10-CM | POA: Diagnosis not present

## 2018-05-03 DIAGNOSIS — Z85038 Personal history of other malignant neoplasm of large intestine: Secondary | ICD-10-CM | POA: Diagnosis not present

## 2018-05-03 DIAGNOSIS — I2510 Atherosclerotic heart disease of native coronary artery without angina pectoris: Secondary | ICD-10-CM | POA: Diagnosis not present

## 2018-05-03 DIAGNOSIS — F17210 Nicotine dependence, cigarettes, uncomplicated: Secondary | ICD-10-CM | POA: Diagnosis not present

## 2018-05-03 DIAGNOSIS — Z7951 Long term (current) use of inhaled steroids: Secondary | ICD-10-CM | POA: Diagnosis not present

## 2018-05-03 DIAGNOSIS — Z86711 Personal history of pulmonary embolism: Secondary | ICD-10-CM | POA: Diagnosis not present

## 2018-05-03 DIAGNOSIS — I110 Hypertensive heart disease with heart failure: Secondary | ICD-10-CM | POA: Diagnosis not present

## 2018-05-03 DIAGNOSIS — E43 Unspecified severe protein-calorie malnutrition: Secondary | ICD-10-CM | POA: Diagnosis not present

## 2018-05-03 DIAGNOSIS — K219 Gastro-esophageal reflux disease without esophagitis: Secondary | ICD-10-CM | POA: Diagnosis not present

## 2018-05-03 DIAGNOSIS — Z87440 Personal history of urinary (tract) infections: Secondary | ICD-10-CM | POA: Diagnosis not present

## 2018-05-03 DIAGNOSIS — Z853 Personal history of malignant neoplasm of breast: Secondary | ICD-10-CM | POA: Diagnosis not present

## 2018-05-03 DIAGNOSIS — Z7982 Long term (current) use of aspirin: Secondary | ICD-10-CM | POA: Diagnosis not present

## 2018-05-03 DIAGNOSIS — I5022 Chronic systolic (congestive) heart failure: Secondary | ICD-10-CM | POA: Diagnosis not present

## 2018-05-03 NOTE — Telephone Encounter (Signed)
Unclear if this is a long term medication for Ms. Schupp or was short term prescribed at last hospital discharge. Will leave for PCP to address on Monday.  Lucila Maine, DO PGY-2, [facility] 05/03/2018 1:16 PM

## 2018-05-06 ENCOUNTER — Other Ambulatory Visit: Payer: Self-pay | Admitting: *Deleted

## 2018-05-06 MED ORDER — TRAMADOL HCL 50 MG PO TABS: 50.0000 mg | ORAL_TABLET | Freq: Three times a day (TID) | ORAL | 0 refills | Status: DC | PRN

## 2018-05-06 NOTE — Patient Outreach (Signed)
[place] [facility] [facility]) Care Management  05/06/2018  [name] 06-13-1924 [id number]   Arrived at [facility] home as scheduled, however member report I am unable to stay in the home for visit due to the home being treated for bed bugs.  She denies any urgent health concerns, state she has been doing great.  Will follow up with member's daughter, [name].  If no further concerns, will consider transition to health coach versus case closure.  [name], [place], MSN [place] [phone]

## 2018-05-07 ENCOUNTER — Telehealth: Payer: Self-pay

## 2018-05-07 ENCOUNTER — Other Ambulatory Visit: Payer: Self-pay

## 2018-05-07 DIAGNOSIS — I5022 Chronic systolic (congestive) heart failure: Secondary | ICD-10-CM | POA: Diagnosis not present

## 2018-05-07 DIAGNOSIS — E43 Unspecified severe protein-calorie malnutrition: Secondary | ICD-10-CM | POA: Diagnosis not present

## 2018-05-07 DIAGNOSIS — I2510 Atherosclerotic heart disease of native coronary artery without angina pectoris: Secondary | ICD-10-CM | POA: Diagnosis not present

## 2018-05-07 DIAGNOSIS — E1151 Type 2 diabetes mellitus with diabetic peripheral angiopathy without gangrene: Secondary | ICD-10-CM | POA: Diagnosis not present

## 2018-05-07 DIAGNOSIS — M109 Gout, unspecified: Secondary | ICD-10-CM | POA: Diagnosis not present

## 2018-05-07 DIAGNOSIS — I110 Hypertensive heart disease with heart failure: Secondary | ICD-10-CM | POA: Diagnosis not present

## 2018-05-07 NOTE — Patient Outreach (Signed)
[facility]) Care Management  05/07/2018  [name] [birth year] [id number]    Follow up call to patient regarding status of SCAT application that was submitted on 04/16/18.  BSW informed patient that she will need to contact SCAT in order to schedule the face to face interview since SCAT eligibility has already left her a voicemail message.  Patient requested that I give contact information to her in-home aide that was there at the time.  SCAT eligibility number was provided to in-home aide and it was explained to her that patient will need to call because SCAT will likely not call again since they already left her a voicemail message.  BSW will follow up again next week to determine if interview was scheduled.   Ronn Melena, BSW Social Worker [phone]

## 2018-05-07 NOTE — Telephone Encounter (Signed)
[name] with [facility] needs verbal orders to start nursing care and for a MSW eval.  OK to leave message on voicemail.  Call back [phone].  Danley Danker, RN [facility] RN)

## 2018-05-08 DIAGNOSIS — E1151 Type 2 diabetes mellitus with diabetic peripheral angiopathy without gangrene: Secondary | ICD-10-CM | POA: Diagnosis not present

## 2018-05-08 DIAGNOSIS — M109 Gout, unspecified: Secondary | ICD-10-CM | POA: Diagnosis not present

## 2018-05-08 DIAGNOSIS — I110 Hypertensive heart disease with heart failure: Secondary | ICD-10-CM | POA: Diagnosis not present

## 2018-05-08 DIAGNOSIS — E43 Unspecified severe protein-calorie malnutrition: Secondary | ICD-10-CM | POA: Diagnosis not present

## 2018-05-08 DIAGNOSIS — I5022 Chronic systolic (congestive) heart failure: Secondary | ICD-10-CM | POA: Diagnosis not present

## 2018-05-08 DIAGNOSIS — I2510 Atherosclerotic heart disease of native coronary artery without angina pectoris: Secondary | ICD-10-CM | POA: Diagnosis not present

## 2018-05-08 NOTE — Telephone Encounter (Signed)
[name] called back about this because had scheduled appt to see patient. Order given per preceptor.  Danley Danker, RN [facility] RN)

## 2018-05-10 DIAGNOSIS — E43 Unspecified severe protein-calorie malnutrition: Secondary | ICD-10-CM | POA: Diagnosis not present

## 2018-05-10 DIAGNOSIS — E1151 Type 2 diabetes mellitus with diabetic peripheral angiopathy without gangrene: Secondary | ICD-10-CM | POA: Diagnosis not present

## 2018-05-10 DIAGNOSIS — M109 Gout, unspecified: Secondary | ICD-10-CM | POA: Diagnosis not present

## 2018-05-10 DIAGNOSIS — I2510 Atherosclerotic heart disease of native coronary artery without angina pectoris: Secondary | ICD-10-CM | POA: Diagnosis not present

## 2018-05-10 DIAGNOSIS — I110 Hypertensive heart disease with heart failure: Secondary | ICD-10-CM | POA: Diagnosis not present

## 2018-05-10 DIAGNOSIS — I5022 Chronic systolic (congestive) heart failure: Secondary | ICD-10-CM | POA: Diagnosis not present

## 2018-05-13 DIAGNOSIS — E43 Unspecified severe protein-calorie malnutrition: Secondary | ICD-10-CM | POA: Diagnosis not present

## 2018-05-13 DIAGNOSIS — M109 Gout, unspecified: Secondary | ICD-10-CM | POA: Diagnosis not present

## 2018-05-13 DIAGNOSIS — I2510 Atherosclerotic heart disease of native coronary artery without angina pectoris: Secondary | ICD-10-CM | POA: Diagnosis not present

## 2018-05-13 DIAGNOSIS — I110 Hypertensive heart disease with heart failure: Secondary | ICD-10-CM | POA: Diagnosis not present

## 2018-05-13 DIAGNOSIS — E1151 Type 2 diabetes mellitus with diabetic peripheral angiopathy without gangrene: Secondary | ICD-10-CM | POA: Diagnosis not present

## 2018-05-13 DIAGNOSIS — I5022 Chronic systolic (congestive) heart failure: Secondary | ICD-10-CM | POA: Diagnosis not present

## 2018-05-14 ENCOUNTER — Other Ambulatory Visit: Payer: Self-pay

## 2018-05-14 NOTE — Patient Outreach (Signed)
[place] [facility]) Care Management  05/14/2018  LABREA ECCLESTON [birth year] [id number]    Follow up call to Ms. Mcquaid to determine if face to face interview has been scheduled with SCAT.  Ms. Kaman reported that she has not contacted SCAT.  BSW offered to call on her behalf and schedule the appointment and Ms. Gilliam agreed to this.  She requested an appointment for Monday, 05/20/18 @ 1:00.  BSW left voicemail message for [place] at [facility] regarding the scheduling of this interview.    Ronn Melena, BSW Social Worker [phone]

## 2018-05-15 ENCOUNTER — Other Ambulatory Visit: Payer: Self-pay

## 2018-05-15 NOTE — Patient Outreach (Signed)
[place] [facility]) Care Management  05/15/2018  [name] 1924-06-13 [id number]   BSW scheduled interview with SCAT on patient's behalf for Thursday, 05/23/18 @ 2:00.  Per Loma Sousa at [facility], they will pick her up for this appointment.  They will call closer to that date to confirm the pick up time.  This information was provided to [name] and her daughter, [name].  Ronn Melena, BSW Social Worker [phone]

## 2018-05-17 ENCOUNTER — Telehealth: Payer: Self-pay

## 2018-05-17 NOTE — Telephone Encounter (Signed)
[name] with [facility] calling to let Dr. Ardelia Mems know that Pt had a nurse visit scheduled for today but pt cancelled due to bing out of the house and would not be back in time for the visit. Ottis Stain, CMA

## 2018-05-20 DIAGNOSIS — I110 Hypertensive heart disease with heart failure: Secondary | ICD-10-CM | POA: Diagnosis not present

## 2018-05-20 DIAGNOSIS — I10 Essential (primary) hypertension: Secondary | ICD-10-CM | POA: Diagnosis not present

## 2018-05-20 DIAGNOSIS — I2510 Atherosclerotic heart disease of native coronary artery without angina pectoris: Secondary | ICD-10-CM | POA: Diagnosis not present

## 2018-05-20 DIAGNOSIS — E1151 Type 2 diabetes mellitus with diabetic peripheral angiopathy without gangrene: Secondary | ICD-10-CM | POA: Diagnosis not present

## 2018-05-20 DIAGNOSIS — I5022 Chronic systolic (congestive) heart failure: Secondary | ICD-10-CM | POA: Diagnosis not present

## 2018-05-20 DIAGNOSIS — I509 Heart failure, unspecified: Secondary | ICD-10-CM | POA: Diagnosis not present

## 2018-05-20 DIAGNOSIS — E119 Type 2 diabetes mellitus without complications: Secondary | ICD-10-CM | POA: Diagnosis not present

## 2018-05-20 DIAGNOSIS — F0390 Unspecified dementia without behavioral disturbance: Secondary | ICD-10-CM | POA: Diagnosis not present

## 2018-05-20 DIAGNOSIS — M109 Gout, unspecified: Secondary | ICD-10-CM | POA: Diagnosis not present

## 2018-05-20 DIAGNOSIS — E43 Unspecified severe protein-calorie malnutrition: Secondary | ICD-10-CM | POA: Diagnosis not present

## 2018-05-20 NOTE — Telephone Encounter (Signed)
Noted.  [name], [place], PGY-3

## 2018-05-24 ENCOUNTER — Other Ambulatory Visit: Payer: Self-pay | Admitting: *Deleted

## 2018-05-24 NOTE — Patient Outreach (Signed)
[place] [facility]) Care Management  05/24/2018  [name] May 15, 1924 [id number]   Call placed to [facility] caregiver/daughter [name] to follow up on current health status.  No answer, HIPAA compliant voice message left. Unsuccessful outreach letter sent, will follow up within the next 4 business days.  [name], [place], MSN [place] [phone]

## 2018-05-26 IMAGING — DX DG CHEST 1V PORT
1 series · 1 of 1 positions shown · non-contrast
Comparison: Radiographs 02/18/2018

CLINICAL DATA: Altered mental status.  Confusion.

EXAM:
PORTABLE CHEST 1 VIEW

[chest]
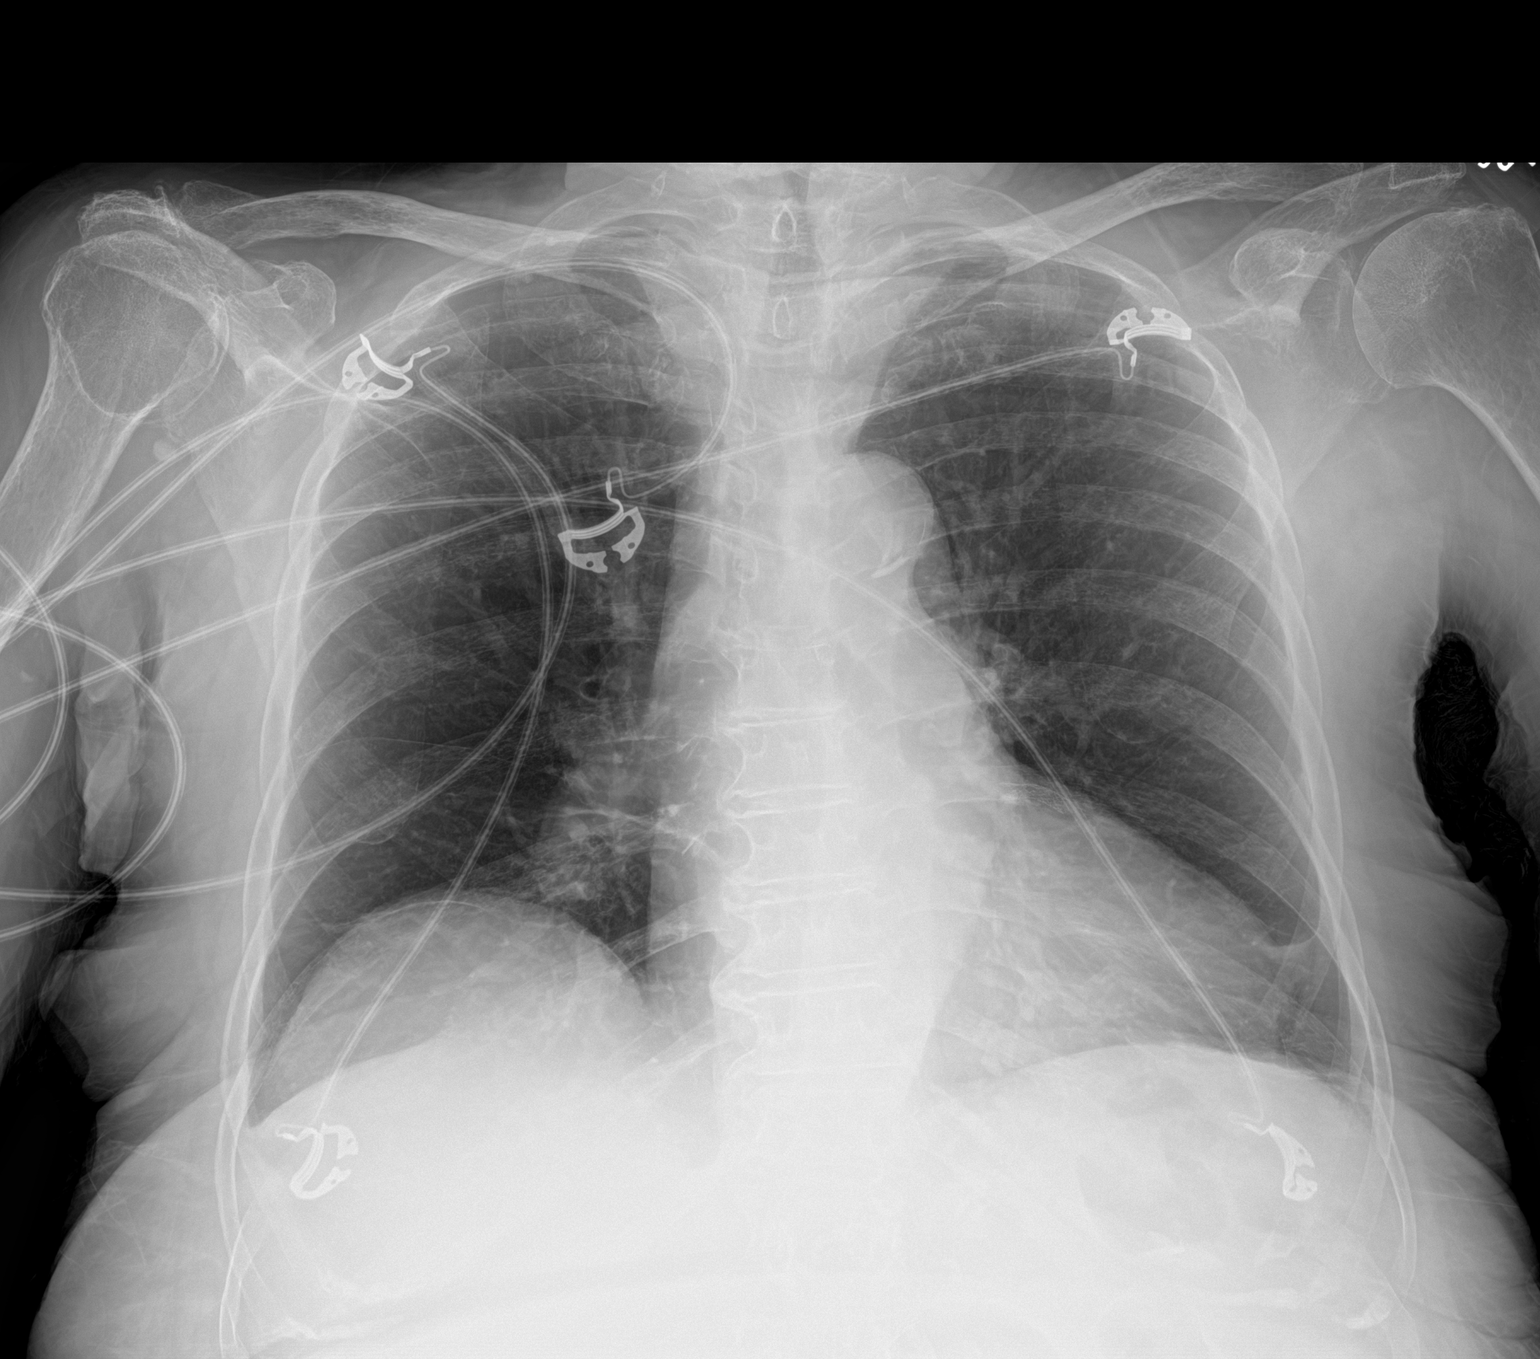

[1 of 1 positions shown; findings below may reference images not displayed]

FINDINGS: The cardiomediastinal contours are unchanged, mild cardiomegaly
stable. Streaky bibasilar opacities are similar to prior exam and
likely atelectasis.. Pulmonary vasculature is normal. No
consolidation, pleural effusion, or pneumothorax. No acute osseous
abnormalities are seen.
IMPRESSION: Bibasilar atelectasis or scarring.  No acute findings.

## 2018-05-27 ENCOUNTER — Ambulatory Visit: Payer: Self-pay

## 2018-05-27 ENCOUNTER — Other Ambulatory Visit: Payer: Self-pay

## 2018-05-27 NOTE — Patient Outreach (Signed)
[place] [facility]) Care Management  05/27/2018  [name] 1923-12-07 [id number]   BSW attempted to contact patient's daughter, [name], regarding SCAT interview that was supposed to occur on 05/23/18.  BSW left voicemail message.  Will try again within four business days.  Unsuccessful outreach letter mailed by Renard Hamper, on 05/24/18.  Ronn Melena, BSW Social Worker [phone]

## 2018-05-29 ENCOUNTER — Other Ambulatory Visit: Payer: Self-pay

## 2018-05-29 NOTE — Patient Outreach (Signed)
[place] [facility]) Care Management  05/29/2018  DAWNETTE MIONE 06-Sep-1924 [id number]  Follow up call to Ms. [name] regarding SCAT interview.  Ms. [name] reported that she attended the interview and was approved for transportation.  BSW mailed information to her regarding how to schedule rides, policies, etc.  BSW will follow up next week to ensure receipt.    Ronn Melena, BSW Social Worker [phone]

## 2018-05-30 ENCOUNTER — Other Ambulatory Visit: Payer: Self-pay | Admitting: *Deleted

## 2018-05-30 DIAGNOSIS — I110 Hypertensive heart disease with heart failure: Secondary | ICD-10-CM | POA: Diagnosis not present

## 2018-05-30 DIAGNOSIS — E1151 Type 2 diabetes mellitus with diabetic peripheral angiopathy without gangrene: Secondary | ICD-10-CM | POA: Diagnosis not present

## 2018-05-30 DIAGNOSIS — I2510 Atherosclerotic heart disease of native coronary artery without angina pectoris: Secondary | ICD-10-CM | POA: Diagnosis not present

## 2018-05-30 DIAGNOSIS — I5022 Chronic systolic (congestive) heart failure: Secondary | ICD-10-CM | POA: Diagnosis not present

## 2018-05-30 DIAGNOSIS — E43 Unspecified severe protein-calorie malnutrition: Secondary | ICD-10-CM | POA: Diagnosis not present

## 2018-05-30 DIAGNOSIS — M109 Gout, unspecified: Secondary | ICD-10-CM | POA: Diagnosis not present

## 2018-05-30 NOTE — Patient Outreach (Signed)
[place] [facility]) Care Management  05/30/2018  SIANNE TEJADA Mar 12, 1924 [id number]   Call placed to member's daughter [name] to follow up on current health status.  She report member is doing well, denies any health concern at this time.  State member is compliant with medications and MD appointments.  Member has been approved for SCAT but she is now concerned about getting member down the steps and is requesting information on a ramp.  She is advised that this care manager will contact BSW regarding this request.  Will close to nursing as she denies any further assistance needed.    [name], [place], MSN [place] [phone]

## 2018-06-03 ENCOUNTER — Emergency Department (HOSPITAL_COMMUNITY): Payer: Medicare Other

## 2018-06-03 ENCOUNTER — Observation Stay (HOSPITAL_BASED_OUTPATIENT_CLINIC_OR_DEPARTMENT_OTHER)
Admission: EM | Admit: 2018-06-03 | Discharge: 2018-06-05 | Disposition: A | Discharge status: Home / Self Care | Payer: Medicare Other | Source: Home / Self Care | Attending: Family Medicine | Admitting: Family Medicine

## 2018-06-03 ENCOUNTER — Encounter (HOSPITAL_COMMUNITY): Payer: Self-pay

## 2018-06-03 ENCOUNTER — Other Ambulatory Visit: Payer: Self-pay

## 2018-06-03 DIAGNOSIS — E876 Hypokalemia: Secondary | ICD-10-CM | POA: Diagnosis not present

## 2018-06-03 DIAGNOSIS — I5032 Chronic diastolic (congestive) heart failure: Secondary | ICD-10-CM | POA: Diagnosis not present

## 2018-06-03 DIAGNOSIS — G459 Transient cerebral ischemic attack, unspecified: Secondary | ICD-10-CM | POA: Diagnosis not present

## 2018-06-03 DIAGNOSIS — I5020 Unspecified systolic (congestive) heart failure: Secondary | ICD-10-CM

## 2018-06-03 DIAGNOSIS — E119 Type 2 diabetes mellitus without complications: Secondary | ICD-10-CM | POA: Insufficient documentation

## 2018-06-03 DIAGNOSIS — I2510 Atherosclerotic heart disease of native coronary artery without angina pectoris: Secondary | ICD-10-CM | POA: Insufficient documentation

## 2018-06-03 DIAGNOSIS — E860 Dehydration: Secondary | ICD-10-CM | POA: Diagnosis not present

## 2018-06-03 DIAGNOSIS — N390 Urinary tract infection, site not specified: Secondary | ICD-10-CM | POA: Diagnosis not present

## 2018-06-03 DIAGNOSIS — R4182 Altered mental status, unspecified: Secondary | ICD-10-CM | POA: Diagnosis not present

## 2018-06-03 DIAGNOSIS — Z7982 Long term (current) use of aspirin: Secondary | ICD-10-CM

## 2018-06-03 DIAGNOSIS — D509 Iron deficiency anemia, unspecified: Secondary | ICD-10-CM

## 2018-06-03 DIAGNOSIS — K219 Gastro-esophageal reflux disease without esophagitis: Secondary | ICD-10-CM

## 2018-06-03 DIAGNOSIS — R410 Disorientation, unspecified: Secondary | ICD-10-CM | POA: Diagnosis not present

## 2018-06-03 DIAGNOSIS — Z7984 Long term (current) use of oral hypoglycemic drugs: Secondary | ICD-10-CM | POA: Insufficient documentation

## 2018-06-03 DIAGNOSIS — I110 Hypertensive heart disease with heart failure: Secondary | ICD-10-CM

## 2018-06-03 DIAGNOSIS — R29810 Facial weakness: Secondary | ICD-10-CM | POA: Diagnosis not present

## 2018-06-03 DIAGNOSIS — R531 Weakness: Secondary | ICD-10-CM | POA: Diagnosis not present

## 2018-06-03 DIAGNOSIS — F17220 Nicotine dependence, chewing tobacco, uncomplicated: Secondary | ICD-10-CM

## 2018-06-03 DIAGNOSIS — R0902 Hypoxemia: Secondary | ICD-10-CM | POA: Diagnosis not present

## 2018-06-03 DIAGNOSIS — R4020 Unspecified coma: Secondary | ICD-10-CM | POA: Diagnosis not present

## 2018-06-03 DIAGNOSIS — G9340 Encephalopathy, unspecified: Secondary | ICD-10-CM | POA: Diagnosis not present

## 2018-06-03 DIAGNOSIS — R29818 Other symptoms and signs involving the nervous system: Secondary | ICD-10-CM | POA: Diagnosis not present

## 2018-06-03 DIAGNOSIS — Z8673 Personal history of transient ischemic attack (TIA), and cerebral infarction without residual deficits: Secondary | ICD-10-CM | POA: Diagnosis not present

## 2018-06-03 LAB — COMPREHENSIVE METABOLIC PANEL
ALBUMIN: 3.4 g/dL — AB (ref 3.5–5.0)
ALT: 10 U/L (ref 0–44)
ANION GAP: 10 (ref 5–15)
AST: 16 U/L (ref 15–41)
Alkaline Phosphatase: 59 U/L (ref 38–126)
BUN: 9 mg/dL (ref 8–23)
CHLORIDE: 106 mmol/L (ref 98–111)
CO2: 24 mmol/L (ref 22–32)
Calcium: 11.2 mg/dL — ABNORMAL HIGH (ref 8.9–10.3)
Creatinine, Ser: 0.92 mg/dL (ref 0.44–1.00)
GFR calc non Af Amer: 52 mL/min — ABNORMAL LOW (ref >60)
Glucose, Bld: 158 mg/dL — ABNORMAL HIGH (ref 70–99)
POTASSIUM: 3.4 mmol/L — AB (ref 3.5–5.1)
SODIUM: 140 mmol/L (ref 135–145)
Total Bilirubin: 0.5 mg/dL (ref 0.3–1.2)
Total Protein: 7.7 g/dL (ref 6.5–8.1)

## 2018-06-03 LAB — APTT: aPTT: 32 seconds (ref 24–36)

## 2018-06-03 LAB — I-STAT CHEM 8, ED
BUN: 11 mg/dL (ref 8–23)
CALCIUM ION: 1.45 mmol/L — AB (ref 1.15–1.40)
CHLORIDE: 106 mmol/L (ref 98–111)
Creatinine, Ser: 0.9 mg/dL (ref 0.44–1.00)
Glucose, Bld: 155 mg/dL — ABNORMAL HIGH (ref 70–99)
HCT: 37 % (ref 36.0–46.0)
HEMOGLOBIN: 12.6 g/dL (ref 12.0–15.0)
Potassium: 3.3 mmol/L — ABNORMAL LOW (ref 3.5–5.1)
SODIUM: 142 mmol/L (ref 135–145)
TCO2: 25 mmol/L (ref 22–32)

## 2018-06-03 LAB — CBC
HCT: 37.2 % (ref 36.0–46.0)
Hemoglobin: 11.1 g/dL — ABNORMAL LOW (ref 12.0–15.0)
MCH: 22.5 pg — AB (ref 26.0–34.0)
MCHC: 29.8 g/dL — ABNORMAL LOW (ref 30.0–36.0)
MCV: 75.5 fL — AB (ref 78.0–100.0)
Platelets: 172 10*3/uL (ref 150–400)
RBC: 4.93 MIL/uL (ref 3.87–5.11)
RDW: 17.1 % — ABNORMAL HIGH (ref 11.5–15.5)
WBC: 4.8 10*3/uL (ref 4.0–10.5)

## 2018-06-03 LAB — DIFFERENTIAL
ABS IMMATURE GRANULOCYTES: 0 10*3/uL (ref 0.0–0.1)
Basophils Absolute: 0 10*3/uL (ref 0.0–0.1)
Basophils Relative: 1 %
EOS ABS: 0.2 10*3/uL (ref 0.0–0.7)
Eosinophils Relative: 3 %
IMMATURE GRANULOCYTES: 0 %
LYMPHS ABS: 1.9 10*3/uL (ref 0.7–4.0)
Lymphocytes Relative: 40 %
Monocytes Absolute: 0.5 10*3/uL (ref 0.1–1.0)
Monocytes Relative: 10 %
Neutro Abs: 2.2 10*3/uL (ref 1.7–7.7)
Neutrophils Relative %: 46 %

## 2018-06-03 LAB — URINALYSIS, ROUTINE W REFLEX MICROSCOPIC
BILIRUBIN URINE: NEGATIVE
Glucose, UA: NEGATIVE mg/dL
Ketones, ur: NEGATIVE mg/dL
NITRITE: POSITIVE — AB
PROTEIN: 30 mg/dL — AB
SPECIFIC GRAVITY, URINE: 1.005 (ref 1.005–1.030)
pH: 6 (ref 5.0–8.0)

## 2018-06-03 LAB — GLUCOSE, CAPILLARY: Glucose-Capillary: 131 mg/dL — ABNORMAL HIGH (ref 70–99)

## 2018-06-03 LAB — I-STAT TROPONIN, ED: TROPONIN I, POC: 0 ng/mL (ref 0.00–0.08)

## 2018-06-03 LAB — PROTIME-INR
INR: 1.14
PROTHROMBIN TIME: 14.5 s (ref 11.4–15.2)

## 2018-06-03 LAB — RAPID URINE DRUG SCREEN, HOSP PERFORMED
AMPHETAMINES: NOT DETECTED
Barbiturates: NOT DETECTED
Benzodiazepines: NOT DETECTED
Cocaine: NOT DETECTED
OPIATES: NOT DETECTED
Tetrahydrocannabinol: NOT DETECTED

## 2018-06-03 LAB — ETHANOL

## 2018-06-03 LAB — CBG MONITORING, ED: Glucose-Capillary: 165 mg/dL — ABNORMAL HIGH (ref 70–99)

## 2018-06-03 MED ORDER — ONDANSETRON HCL 4 MG/2ML IJ SOLN
4.0000 mg | Freq: Three times a day (TID) | INTRAMUSCULAR | Status: DC | PRN
  Administered 2018-06-03 – 2018-06-05 (×2): 4 mg via INTRAVENOUS
  Filled 2018-06-03 (×2): qty 2

## 2018-06-03 MED ORDER — ACETAMINOPHEN 325 MG PO TABS: 650.0000 mg | ORAL_TABLET | ORAL | Status: DC | PRN

## 2018-06-03 MED ORDER — ACETAMINOPHEN 650 MG RE SUPP: 650.0000 mg | RECTAL | Status: DC | PRN

## 2018-06-03 MED ORDER — ENOXAPARIN SODIUM 40 MG/0.4ML ~~LOC~~ SOLN
40.0000 mg | SUBCUTANEOUS | Status: DC
  Administered 2018-06-03 – 2018-06-04 (×2): 40 mg via SUBCUTANEOUS
  Filled 2018-06-03 (×2): qty 0.4

## 2018-06-03 MED ORDER — SODIUM CHLORIDE 0.9 % IV SOLN
1.0000 g | Freq: Once | INTRAVENOUS | Status: AC
  Administered 2018-06-03: 1 g via INTRAVENOUS
  Filled 2018-06-03: qty 10

## 2018-06-03 MED ORDER — ACETAMINOPHEN 160 MG/5ML PO SOLN: 650.0000 mg | ORAL | Status: DC | PRN

## 2018-06-03 MED ORDER — POTASSIUM CHLORIDE CRYS ER 10 MEQ PO TBCR
30.0000 meq | EXTENDED_RELEASE_TABLET | Freq: Once | ORAL | Status: AC
  Administered 2018-06-03: 30 meq via ORAL
  Filled 2018-06-03: qty 1

## 2018-06-03 MED ORDER — SENNOSIDES-DOCUSATE SODIUM 8.6-50 MG PO TABS: 1.0000 | ORAL_TABLET | Freq: Every evening | ORAL | Status: DC | PRN

## 2018-06-03 MED ORDER — CLOPIDOGREL BISULFATE 75 MG PO TABS
75.0000 mg | ORAL_TABLET | Freq: Every day | ORAL | Status: DC
  Administered 2018-06-03 – 2018-06-05 (×3): 75 mg via ORAL
  Filled 2018-06-03 (×3): qty 1

## 2018-06-03 MED ORDER — FAMOTIDINE 10 MG PO TABS
10.0000 mg | ORAL_TABLET | Freq: Two times a day (BID) | ORAL | Status: DC
  Administered 2018-06-03 – 2018-06-05 (×4): 10 mg via ORAL
  Filled 2018-06-03 (×4): qty 1

## 2018-06-03 MED ORDER — ROSUVASTATIN CALCIUM 20 MG PO TABS
20.0000 mg | ORAL_TABLET | Freq: Every day | ORAL | Status: DC
  Administered 2018-06-04: 20 mg via ORAL
  Filled 2018-06-03: qty 1

## 2018-06-03 MED ORDER — ASPIRIN 325 MG PO TABS
325.0000 mg | ORAL_TABLET | Freq: Every day | ORAL | Status: DC
  Administered 2018-06-03 – 2018-06-05 (×3): 325 mg via ORAL
  Filled 2018-06-03 (×3): qty 1

## 2018-06-03 MED ORDER — ASPIRIN 300 MG RE SUPP: 300.0000 mg | Freq: Every day | RECTAL | Status: DC

## 2018-06-03 NOTE — Progress Notes (Addendum)
Pt had an episode of nausea, spat up some saliva with dry heaves after taking her pills, Dr Ouida Sills on call paged and notified, came up to see pt, pt denies being nauseated again so no new meds was ordered, will continue to monitor. [place], Jumaane Weatherford Efe  Pt spat up her drink again when trying to take medication, iv Zofran 4mg  given at 2258, pt reassured. Obas[place]ie-Asidi, Ayven Glasco Efe

## 2018-06-03 NOTE — ED Notes (Signed)
Pure wick in place 

## 2018-06-03 NOTE — Progress Notes (Signed)
Pt admitted from ED with stroke like symptoms, pt alert and oriented, denies any pain, settled in bed with call light within pt's reach, tele monitor put and verified on pt, was however reassured, safety concern addressed, will continue to monitor. [place], Christophor Eick Efe

## 2018-06-03 NOTE — ED Notes (Signed)
Code Stroke @ [phone]

## 2018-06-03 NOTE — ED Triage Notes (Signed)
Per [facility] pt had acute onset AMS and facial droop, emesis x2. Pt non verbal and difficult to asses with ems. No other neuro deficits noted

## 2018-06-03 NOTE — Consult Note (Signed)
Requesting Physician: Dr. Mingo Amber    Chief Complaint: Left facial droop, not speaking  History obtained from: Patient and Chart     HPI:                                                                                                                                       [name] is an 84 y.o. female with past medical history of chronic systolic heart failure, coronary artery disease, diabetes mellitus, hyperlipidemia, hypertension, neuropathy, colon cancer and breast cancer presents to the emergency room after her daughter found her not acting herself and noticed left facial droop.   Last seen normal was unclear but around 3 PM patient was with relatives and talking.  Around 4:30 PM her daughter went to see her and noticed she had left facial droop and was altered.  In the emergency room patient was not answering questions appropriately and was stroke alerted.  On assessment at bedside patient had improved significantly and was speaking in clear sentences.  No obvious facial droop was noted and patient moving all 4 extremities.  Head CT was obtained which showed no acute abnormality/hemorrhage.  TPA was not administered due to poor functional baseline and improving symptoms  Date last known well: 7.22.19 Time last known well: Around 3pm tPA Given: no, improving symptoms and poor MRS NIHSS: 4 Baseline MRS 4    Past Medical History:  Diagnosis Date  . Allergy   . Anemia    Iron def anemia  . Arthritis   . Asthma   . Breast cancer ([place]) 10/2010   Invasive Ductal Carcinoma, s/p bilateral mastectomy, now on Tamoxifen. Followed by Dr Jamse Arn.   . Breast cancer ([place]) 2011   s/p Bilateral masectomy  . CAD (coronary artery disease)   . Carotid artery aneurysm ([place]) 08/2010   right ICA, 5 x 2mm  . CHF (congestive heart failure) (HCC)    Systolic   . Colon cancer ([place])   . Colon cancer ([place]) 12/11/2013  . DDD (degenerative disc disease), cervical   . Diabetes mellitus   .  Diverticulosis   . GERD (gastroesophageal reflux disease)   . H/O: GI bleed   . Hiatal hernia   . History of colon cancer   . Hypercalcemia 06/09/2014  . Hyperlipidemia   . Hypertension   . Myocardial infarction ([place])   . Neuropathy   . Pneumonia 03/2012  . Shortness of breath     Past Surgical History:  Procedure Laterality Date  . Breast masectomy  10/2010   Bilateral masectomy by Dr Lucia Gaskins s/p invasive ductal carcinoma.  Marland Kitchen BREAST SURGERY    . Cardiac  Catherization  2007   Severe 3-vessel disease.  EF 20-25%.  . ESOPHAGOGASTRODUODENOSCOPY  2001   Esophageal Tear  . ESOPHAGOGASTRODUODENOSCOPY  10/27/2012   Procedure: ESOPHAGOGASTRODUODENOSCOPY (EGD);  Surgeon: Irene Shipper, MD;  Location: [facility] ENDOSCOPY;  Service: Endoscopy;  Laterality: N/A;  pat  . JOINT REPLACEMENT Right 2001   Knee  . TOTAL KNEE ARTHROPLASTY  2001    Family History  Problem Relation Age of Onset  . Sickle cell anemia Other   . Heart disease Mother   . Heart disease Father   . Cancer Daughter 63       breast ca  . Hypertension Daughter   . Cancer Daughter 23       breast ca  . Hypertension Daughter   . Heart disease Son        Poor circulation-Left Leg  . Diabetes Daughter   . Hypertension Daughter   . Hyperlipidemia Daughter        Poor circulation- Toe amputation   Social History:  reports that she has never smoked. Her smokeless tobacco use includes snuff. She reports that she does not drink alcohol or use drugs.  Allergies:  Allergies  Allergen Reactions  . Tape Other (See Comments)    Skin is very thin and will tear and bruise easily!!  . Lisinopril Cough    Medications:                                                                                                                        I reviewed home medications   ROS:                                                                                                                                     14 systems reviewed and  negative except above   Examination:                                                                                                      General: Appears well-developed and well-nourished.  Psych: Affect appropriate to situation Eyes: No scleral injection HENT: No OP obstrucion Head: Normocephalic.  Cardiovascular: Normal rate and regular rhythm.  Respiratory: Effort normal and breath sounds normal to anterior ascultation GI: Soft.  No distension. There is no tenderness.  Skin: WDI    Neurological Examination Mental Status: Alert, oriented, thought content appropriate. Unable to state the month or her age, but will state her date of birth and knows which hospital she is not.  Speech fluent without evidence of aphasia. Able to follow simple commands without difficulty. Cranial Nerves: II: Visual fields grossly normal,  III,IV, VI: ptosis not present, extra-ocular motions intact bilaterally, pupils equal, round, reactive to light and accommodation V,VII: smile symmetric, facial light touch sensation normal bilaterally VIII: hearing normal bilaterally IX,X: uvula rises symmetrically XI: bilateral shoulder shrug XII: midline tongue extension Motor: Right : Upper extremity   5/5    Left:     Upper extremity   4+/5  Lower extremity   4/5     Lower extremity   4/5 Tone and bulk:normal tone throughout; no atrophy noted Sensory: reduced sensation bilaterally in stocking distribution in both feet Deep Tendon Reflexes: not assessed Plantars: Right: downgoing   Left: downgoing Cerebellar: No obvious ataxia on finger to nose bilaterally Gait: normal gait and station     Lab Results: Basic Metabolic Panel: Recent Labs  Lab 06/03/18 1817 06/03/18 1824  NA 140 142  K 3.4* 3.3*  CL 106 106  CO2 24  --   GLUCOSE 158* 155*  BUN 9 11  CREATININE 0.92 0.90  CALCIUM 11.2*  --     CBC: Recent Labs  Lab 06/03/18 1817 06/03/18 1824  WBC 4.8  --   NEUTROABS 2.2  --   HGB 11.1*  12.6  HCT 37.2 37.0  MCV 75.5*  --   PLT 172  --     Coagulation Studies: Recent Labs    06/03/18 1817  LABPROT 14.5  INR 1.14    Imaging: Ct Head Code Stroke Wo Contrast  Result Date: 06/03/2018 CLINICAL DATA:  Code stroke. Altered mental status, facial droop, vomiting. History of RIGHT ICA aneurysm, breast cancer, colon cancer, hypertension, hyperlipidemia. EXAM: CT HEAD WITHOUT CONTRAST TECHNIQUE: Contiguous axial images were obtained from the base of the skull through the vertex without intravenous contrast. COMPARISON:  CT HEAD November 19, 2017 and MRI head November 25, 2015. FINDINGS: BRAIN: No intraparenchymal hemorrhage, mass effect nor midline shift. The ventricles and sulci are normal for age. Patchy supratentorial white matter hypodensities within normal range for patient's age, though non-specific are most compatible with chronic small vessel ischemic disease. Old RIGHT basal ganglia lacunar infarcts. Focal vermian atrophy. Old small LEFT cerebellar infarct. No acute large vascular territory infarcts. No abnormal extra-axial fluid collections. Basal cisterns are patent. VASCULAR: Severe calcific atherosclerosis of the carotid siphons. SKULL: No skull fracture. No significant scalp soft tissue swelling. SINUSES/ORBITS: Frothy secretions bilateral sphenoid sinuses.The included ocular globes and orbital contents are non-suspicious. Status post bilateral ocular lens implants. OTHER: None. ASPECTS [facility] Stroke Program Early CT Score) - Ganglionic level infarction (caudate, lentiform nuclei, internal capsule, insula, M1-M3 cortex): 7 - Supraganglionic infarction (M4-M6 cortex): 3 Total score (0-10 with 10 being normal): 10 IMPRESSION: 1. No acute intracranial process. 2. ASPECTS is 10. 3. Stable examination including moderate chronic small vessel ischemic changes and severe atherosclerosis. 4. Critical Value/emergent results were called by telephone at the time of interpretation on 06/03/2018  at 6:47 pm to Dr. Dene Gentry , who verbally acknowledged these results. Electronically Signed   By: Elon Alas M.D.   On: 06/03/2018 18:48     ASSESSMENT AND PLAN  84 y.o. female with past medical history of  chronic systolic heart failure, coronary artery disease, diabetes mellitus, hyperlipidemia, hypertension, neuropathy, colon cancer and breast cancer presents to the emergency room after her daughter found her not acting herself and noticed left facial droop.  She is improved drastically on assessment and has not a candidate for TPA.  CT head was negative.  patient needs to be admitted for stroke work-up.   Transient ischemic attack/Mild ischemic stroke   Recommend # MRI of the brain without contrast #MRA Head and neck  #Transthoracic Echo  # Start patient on ASA 325mg  daily and Plavix 75 mg daily ( x3 weeks vs 3 months depending on whether patient has intracranial atherosclerotic disease)  #Start or continue Atorvastatin 80 mg/other high intensity statin # BP goal: permissive HTN upto 220/110 mmHg # HBAIC and Lipid profile # Telemetry monitoring # Frequent neuro checks # NPO until passes stroke swallow screen  Please page stroke NP  Or  PA  Or MD from 8am -4 pm  as this patient from this time will be  followed by the stroke.   You can look them up on [email or web address]  Password [facility]    Sushanth Aroor [facility] Pager Number [phone]

## 2018-06-03 NOTE — H&P (Addendum)
[facility] Admission History and Physical Service Pager: [phone]  [name] Medical record number: [id number] Date of birth: [birth year] Age: 84 y.o. Gender: female  Primary Care Provider: Weldon Bing, DO Consultants: Neuro Code Status: DNR  Chief Complaint: facial droop  Assessment and Plan: [name] is a 84 y.o. female presenting with facial droop and AMS. PMH is significant for T2DM, HTN, CHF, HLD, and GERD, and h/o b/l-breast cancer and colon cancer.  TIA: Patient was with her family at [place] when she became altered and non-verbal with facial droop. She had two episodes of vomiting earlier which she associates with eating foods that didn't sit well with her. She denies losing consciousness, incontinence, and new onset numbness and weakness in extremities.  Symptoms seem to have resolved in the ED with no signs of facial droop or focal weakness during exam. UDS and Ethanol negative.  - Admit to med-surg, attending [place]  - Neurology following  - Swallow test - passed> regular diet - Vitals per routine, Neuro Checks q2 hours - Risk labs: HbA1c, TSH, Lipid Panel - Stat troponin negative - Permissive HTN 220/110> Holding home hypertensive meds - Head/Neck MRA  - Head CT negative in the ED for acute bleed - Cardiac Monitoring - PT/OT/SLP to eval and treat - Zofran prn nausea, vomiting  Bacteruria: UA in the ED was positive for Nitrites and Leukocytes - No dysuria, urgency or frequency, but with AMS - 1g Rocephin in the ED - Will add-on urine culture to sample collected prior to start of abx  HFpEF, chronic, stable: Last EF 50-55% and G1DD. Takes amlodipine, carvedilol, losartan at home - Discontinue home meds for permissive HTN - restart as able  Hypokalemia: chronic issue. 3.3 > 3.4 on admission. - Replete K - BMP  HTN: BP 164/62 on arrival. Takes amlodipine, carvedilol, losartan at home - Holding  anti-hypertensive meds - Permissive HTN 220/110 - Vitals per routine  T2DM: last A1c 6.7 (11/26/17), takes metformin 500mg  at home. - Hold metformin - Add sSSI if inidicated  HLD: last Lipid panel revealed low HDLs at 26 (11/26/2017), but is otherwise normal. Takes Crestor 20mg  qHS - Continue Crestor 20mg  daily  GERD: chronic, tasks ranitidine - Famotidine PRN  H/o bilateral breast cancer and colon cancer: chronic, has been stable. - Monitor outpatient  FEN/GI: Regular diet Prophylaxis: Lovenox  Disposition: admit to telemetry for observation  History of Present Illness:  [name] is a 83 y.o. female presenting with facial drooping. The daughter reports that her son noticed the patient having drooping of her face, as well as altered mental status. The patient is sharing that she felt pain in her left chest today, but that this is not an unusual problem due to previous mastectomy. At the time of the chest pain she also felt she was short of breath and coughed up phlegm. She had two episodes of nausea with vomiting earlier, which she attributes to eating foods that didn't sit well with her stomach.  Another daughter reports her mother has been more forgetful recently. The patient requires assistance in dressing and cooking, but can feed herself.   In the ED the patient denies any fevers, chills, current nausea, chest pain, and shortness of breath. She denies losing consciousness, incontinence, and new onset numbness and weakness in extremities. She reports feeling back to normal. She denies urinary urgency, frequency, and urgency.  She reports feeling week all over, mostly in her legs, and  having constipated.  Review Of Systems: Per HPI with the following additions:   Review of Systems  Constitutional: Negative for chills and fever.  Respiratory: Positive for cough, sputum production and shortness of breath.   Cardiovascular: Positive for chest pain.       From previous  mastectomy  Gastrointestinal: Positive for constipation, nausea and vomiting. Negative for abdominal pain and diarrhea.  Genitourinary: Negative for dysuria.   Patient Active Problem List   Diagnosis Date Noted  . TIA (transient ischemic attack) 06/03/2018  . Mobility impaired 04/26/2018  . Disorientation   . Swelling of left hand 12/10/2017  . Cough in adult   . Left knee pain 08/09/2017  . Protein-calorie malnutrition, severe 02/06/2017  . Urinary tract infection without hematuria 02/03/2017  . Hypokalemia 10/13/2016  . Pressure injury of skin 08/31/2016  . Pulmonary embolus ([place]) 08/30/2016  . Mild dementia 06/01/2016  . Depression 01/20/2016  . Suicidal ideation 01/19/2016  . Prolapse of female pelvic organs 12/28/2015  . Leg weakness, bilateral 12/08/2015  . Weakness of right lower extremity 11/25/2015  . Dry eyes 10/28/2015  . (HFpEF) heart failure with preserved ejection fraction ([place])   . CAD in native artery   . Gout 05/14/2015  . Acute delirium 04/15/2015  . Dyslipidemia   . Essential hypertension   . Seasonal allergies 02/10/2015  . Stroke-like symptoms   . Stroke-like episode ([place]) 01/25/2015  . Syncope and collapse 01/25/2015  . Dizziness 09/19/2014  . Anemia of chronic illness 08/19/2014  . Acute superficial venous thrombosis of right lower extremity 08/19/2014  . Hypercalcemia 06/09/2014  . Breast cancer, left breast ([place]) 06/09/2014  . Breast cancer, right breast ([place]) 06/09/2014  . Edema of left lower extremity 03/30/2014  . PAD (peripheral artery disease) ([place]) 03/30/2014  . History of colon cancer 12/11/2013  . History of fall 05/15/2013  . Bilateral leg pain 03/07/2013  . Lower extremity edema 02/19/2013  . Debility 01/22/2013  . Stricture and stenosis of esophagus 10/27/2012  . Constipation 06/24/2012  . Thoracic back pain 05/24/2012  . Tobacco abuse 06/02/2011  . History of breast cancer in female, bilateral.  Mastectomies 10/24/2010.  04/26/2011  . Hiatal hernia 03/22/2011  . Colon polyp 03/22/2011  . Poor circulation 03/22/2011  . Cough 01/31/2011  . 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 Neuropathy ([place]) 08/25/2009  . ANEMIA-IRON DEFICIENCY 04/29/2007  . HLD (hyperlipidemia) 01/29/2007  . Controlled type 2 diabetes mellitus without complication, without long-term current use of insulin ([place]) 01/10/2007  . HYPERTENSION, BENIGN SYSTEMIC 01/10/2007  . Coronary atherosclerosis 01/10/2007  . Chronic systolic heart failure ([place]) 01/10/2007  . Asthma 01/10/2007  . Reflux esophagitis 01/10/2007  . Osteoarthritis, multiple sites 01/10/2007    Past Medical History: Past Medical History:  Diagnosis Date  . Allergy   . Anemia    Iron def anemia  . Arthritis   . Asthma   . Breast cancer ([place]) 10/2010   Invasive Ductal Carcinoma, s/p bilateral mastectomy, now on Tamoxifen. Followed by Dr Jamse Arn.   . Breast cancer ([place]) 2011   s/p Bilateral masectomy  . CAD (coronary artery disease)   . Carotid artery aneurysm ([place]) 08/2010   right ICA, 5 x 69mm  . CHF (congestive heart failure) (H[facility])    Systolic   . Colon cancer ([place])   . Colon cancer ([place]) 12/11/2013  . DDD (degenerative disc disease), cervical   . Diabetes mellitus   . Diverticulosis   . GERD (gastroesophageal reflux disease)   . H/O:  GI bleed   . Hiatal hernia   . History of colon cancer   . Hypercalcemia 06/09/2014  . Hyperlipidemia   . Hypertension   . Myocardial infarction ([place])   . Neuropathy   . Pneumonia 03/2012  . Shortness of breath     Past Surgical History: Past Surgical History:  Procedure Laterality Date  . Breast masectomy  10/2010   Bilateral masectomy by Dr Lucia Gaskins s/p invasive ductal carcinoma.  Marland Kitchen BREAST SURGERY    . Cardiac  Catherization  2007   Severe 3-vessel disease.  EF 20-25%.  . ESOPHAGOGASTRODUODENOSCOPY  2001   Esophageal Tear  . ESOPHAGOGASTRODUODENOSCOPY  10/27/2012   Procedure: ESOPHAGOGASTRODUODENOSCOPY (EGD);   Surgeon: Irene Shipper, MD;  [facility] ENDOSCOPY;  Service: Endoscopy;  Laterality: N/A;  pat  . JOINT REPLACEMENT Right 2001   Knee  . TOTAL KNEE ARTHROPLASTY  2001   Social History: Social History   Tobacco Use  . Smoking status: Never Smoker  . Smokeless tobacco: Current User    Types: Snuff  Substance Use Topics  . Alcohol use: No  . Drug use: No   Please also refer to relevant sections of EMR.  Family History: Family History  Problem Relation Age of Onset  . Sickle cell anemia Other   . Heart disease Mother   . Heart disease Father   . Cancer Daughter 58       breast ca  . Hypertension Daughter   . Cancer Daughter 44       breast ca  . Hypertension Daughter   . Heart disease Son        Poor circulation-Left Leg  . Diabetes Daughter   . Hypertension Daughter   . Hyperlipidemia Daughter        Poor circulation- Toe amputation   Allergies and Medications: Allergies  Allergen Reactions  . Tape Other (See Comments)    Skin is very thin and will tear and bruise easily!!  . Lisinopril Cough   No current facility-administered medications on file prior to encounter.    Current Outpatient Medications on File Prior to Encounter  Medication Sig Dispense Refill  . acetaminophen (TYLENOL) 500 MG tablet Take 500 mg by mouth every 6 (six) hours as needed for moderate pain.     Marland Kitchen albuterol (PROVENTIL HFA;VENTOLIN HFA) 108 (90 BASE) MCG/ACT inhaler Inhale 2 puffs into the lungs every 6 (six) hours as needed. For shortness of breath (Patient taking differently: Inhale 2 puffs into the lungs every 6 (six) hours as needed for shortness of breath. ) 1 Inhaler 6  . Amino Acids-Protein Hydrolys (FEEDING SUPPLEMENT, PRO-STAT SUGAR FREE 64,) LIQD Take 30 mLs by mouth 2 (two) times daily with a meal. ensure    . amLODipine (NORVASC) 5 MG tablet Take 1 tablet (5 mg total) by mouth daily. (Patient taking differently: Take 5 mg by mouth at bedtime. ) 90 tablet 6  . aspirin (ASPIRIN  CHILDRENS) 81 MG chewable tablet Chew 81 mg by mouth daily.     . carvedilol (COREG) 12.5 MG tablet TAKE 1 TABLET BY MOUTH TWICE A DAY WITH A MEAL (Patient taking differently: Take 12.5 mg by mouth two times a day with a meal) 60 tablet 6  . guaifenesin (ROBITUSSIN) 100 MG/5ML syrup Take 10 mLs (200 mg total) by mouth 3 (three) times daily as needed for cough. 120 mL 0  . loratadine (CLARITIN) 10 MG tablet TAKE 1/2 TABLET BY MOUTH DAILY (Patient taking differently: Take 5 mg by  mouth once a day as needed for allergies) 15 tablet 0  . losartan (COZAAR) 50 MG tablet TAKE 1 TABLET (50 MG TOTAL) BY MOUTH AT BEDTIME. 90 tablet 6  . metFORMIN (GLUCOPHAGE) 500 MG tablet Take 1 tablet (500 mg total) by mouth daily with breakfast. 90 tablet 3  . ranitidine (ZANTAC) 150 MG tablet TAKE 1 TABLET BY MOUTH TWICE A DAY (Patient taking differently: Take 150 mg by mouth two times a day) 60 tablet 3  . rosuvastatin (CRESTOR) 20 MG tablet TAKE 1 TABLET BY MOUTH EVERYDAY AT BEDTIME 30 tablet 6  . senna-docusate (SENOKOT-S) 8.6-50 MG tablet Take 2 tablets by mouth at bedtime. (Patient taking differently: Take 1 tablet by mouth every other day. ) 60 tablet 3  . traMADol (ULTRAM) 50 MG tablet Take 1 tablet (50 mg total) by mouth every 8 (eight) hours as needed for severe pain. (Patient taking differently: Take 25 mg by mouth every 8 (eight) hours as needed for severe pain. ) 30 tablet 0  . capsaicin (ZOSTRIX) 0.025 % cream APPLY TO AFFECTED AREA TWICE A DAY (Patient not taking: Reported on 06/03/2018) 60 g 1  . ferrous sulfate 325 (65 FE) MG tablet Take 1 tablet (325 mg total) by mouth daily with breakfast. (Patient not taking: Reported on 06/03/2018) 30 tablet 2  . hydroxypropyl methylcellulose / hypromellose (ISOPTO TEARS / GONIOVISC) 2.5 % ophthalmic solution Place 1 drop 3 (three) times daily into both eyes. (Patient not taking: Reported on 06/03/2018) 15 mL 12  . PATADAY 0.2 % SOLN APPLY 1 DROP TO EYE DAILY. (Patient not  taking: Reported on 06/03/2018) 2.5 mL 0  . petrolatum-hydrophilic-aloe vera (ALOE VESTA) ointment Apply topically 2 (two) times daily as needed for wound care. (Patient not taking: Reported on 06/03/2018) 226 g 0  . silver sulfADIAZINE (SILVADENE) 1 % cream Apply 1 application topically daily. (Patient not taking: Reported on 06/03/2018) 50 g 0  . [DISCONTINUED] cetirizine (ZYRTEC) 5 MG tablet Take 1 tablet (5 mg total) by mouth daily. Take at night time as it can cause some drowsiness 30 tablet 4   Objective: BP (!) 147/69   Pulse 82   Temp 98.4 F (36.9 C)   Resp 18   Ht 4\' 11"  (1.499 m)   Wt 110 lb (49.9 kg)   SpO2 100%   BMI 22.22 kg/m  Physical Exam  Constitutional: She appears well-developed and well-nourished.  HENT:  Head: Normocephalic and atraumatic.  Hard of hearing  Eyes: Conjunctivae and EOM are normal. No scleral icterus.  Neck: Normal range of motion. Neck supple.  Cardiovascular: Normal rate, regular rhythm and normal heart sounds.  Pulmonary/Chest: Effort normal. No respiratory distress. She has no wheezes.  Abdominal: Soft. Bowel sounds are normal.  Musculoskeletal: Normal range of motion. She exhibits no edema or deformity.  Healing ulcer to left lateral heel  Lymphadenopathy:    She has no cervical adenopathy.  Neurological: She is alert. No cranial nerve deficit. She exhibits normal muscle tone.  Oriented to person and place.  Skin: Skin is warm and dry. Capillary refill takes less than 2 seconds. She is not diaphoretic. No erythema. No pallor.  Psychiatric: She has a normal mood and affect. Her behavior is normal.   Labs and Imaging: CBC BMET  Recent Labs  Lab 06/03/18 1817 06/03/18 1824  WBC 4.8  --   HGB 11.1* 12.6  HCT 37.2 37.0  PLT 172  --    Recent Labs  Lab 06/03/18 1817 06/03/18  1824  NA 140 142  K 3.4* 3.3*  CL 106 106  CO2 24  --   BUN 9 11  CREATININE 0.92 0.90  GLUCOSE 158* 155*  CALCIUM 11.2*  --      UDS (7/22): negative UA  (7/22): Hazy, small hgb, 30 protein, positive nitrites and leukocytes Troponin (7/22): negative APTT (7/22): 32 nml PT (7/22): 14.5 nml INR (7/22): 1.14 nml Ethanol (7/22): negative   Daisy Floro, DO 06/03/2018, 9:11 PM PGY-1, [place] Intern pager: [phone], text pages welcome  [place] Upper-Level Resident Addendum   I have independently interviewed and examined the patient. I have discussed the above with the original author and agree with their documentation. My edits for correction/addition/clarification are in purple. Please see also any attending notes.    [place] Danira Nylander, DO PGY-2, [facility] 06/03/2018 10:52 PM  [place] Service pager: [phone] (text pages welcome through [facility])

## 2018-06-03 NOTE — ED Provider Notes (Signed)
[place] EMERGENCY DEPARTMENT Provider Note   CSN: [id number] Arrival date & time: 06/03/18  1746   An emergency department physician performed an initial assessment on this suspected stroke patient at 87.  History   Chief Complaint Chief Complaint  Patient presents with  . Altered Mental Status  . Code Stroke    HPI [name] is a 84 y.o. female.  84 year old female with prior history as detailed below presents for evaluation of acute change in mental status.  Patient reportedly was normal until approximately 1700 today.  At that time family noted that she is to be confused.  She also reportedly had a left facial droop.  Patient also was noted to be less verbal than normal.  EMS transported the patient to the ED.  Code stroke was not called enroute.  Upon arrival to the ED, patient appears to be improved.  She is alert and oriented.  She does not appear to be in distress.  She has no significant abnormal neurologic findings.  Code stroke initiated upon my initial evaluation.  The history is provided by the patient, medical records and the EMS personnel.  Altered Mental Status   This is a new problem. The current episode started less than 1 hour ago. The problem has been rapidly improving. Associated symptoms include confusion and weakness.    Past Medical History:  Diagnosis Date  . Allergy   . Anemia    Iron def anemia  . Arthritis   . Asthma   . Breast cancer ([place]) 10/2010   Invasive Ductal Carcinoma, s/p bilateral mastectomy, now on Tamoxifen. Followed by Dr Jamse Arn.   . Breast cancer ([place]) 2011   s/p Bilateral masectomy  . CAD (coronary artery disease)   . Carotid artery aneurysm ([place]) 08/2010   right ICA, 5 x 72mm  . CHF (congestive heart failure) (HCC)    Systolic   . Colon cancer ([place])   . Colon cancer ([place]) 12/11/2013  . DDD (degenerative disc disease), cervical   . Diabetes mellitus   . Diverticulosis   . GERD (gastroesophageal reflux  disease)   . H/O: GI bleed   . Hiatal hernia   . History of colon cancer   . Hypercalcemia 06/09/2014  . Hyperlipidemia   . Hypertension   . Myocardial infarction ([place])   . Neuropathy   . Pneumonia 03/2012  . Shortness of breath     Patient Active Problem List   Diagnosis Date Noted  . Mobility impaired 04/26/2018  . Disorientation   . Swelling of left hand 12/10/2017  . Cough in adult   . Left knee pain 08/09/2017  . Protein-calorie malnutrition, severe 02/06/2017  . Urinary tract infection without hematuria 02/03/2017  . Hypokalemia 10/13/2016  . Pressure injury of skin 08/31/2016  . Pulmonary embolus ([place]) 08/30/2016  . Mild dementia 06/01/2016  . Depression 01/20/2016  . Suicidal ideation 01/19/2016  . Prolapse of female pelvic organs 12/28/2015  . Leg weakness, bilateral 12/08/2015  . Weakness of right lower extremity 11/25/2015  . Dry eyes 10/28/2015  . (HFpEF) heart failure with preserved ejection fraction ([place])   . CAD in native artery   . Gout 05/14/2015  . Acute delirium 04/15/2015  . Dyslipidemia   . Essential hypertension   . Seasonal allergies 02/10/2015  . Stroke-like symptoms   . Stroke-like episode ([place]) 01/25/2015  . Syncope and collapse 01/25/2015  . Dizziness 09/19/2014  . Anemia of chronic illness 08/19/2014  . Acute superficial venous  thrombosis of right lower extremity 08/19/2014  . Hypercalcemia 06/09/2014  . Breast cancer, left breast ([place]) 06/09/2014  . Breast cancer, right breast ([place]) 06/09/2014  . Edema of left lower extremity 03/30/2014  . PAD (peripheral artery disease) ([place]) 03/30/2014  . History of colon cancer 12/11/2013  . History of fall 05/15/2013  . Bilateral leg pain 03/07/2013  . Lower extremity edema 02/19/2013  . Debility 01/22/2013  . Stricture and stenosis of esophagus 10/27/2012  . Constipation 06/24/2012  . Thoracic back pain 05/24/2012  . Tobacco abuse 06/02/2011  . History of breast cancer in female, bilateral.   Mastectomies 10/24/2010. 04/26/2011  . Hiatal hernia 03/22/2011  . Colon polyp 03/22/2011  . Poor circulation 03/22/2011  . Cough 01/31/2011  . 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 Neuropathy ([place]) 08/25/2009  . ANEMIA-IRON DEFICIENCY 04/29/2007  . HLD (hyperlipidemia) 01/29/2007  . Controlled type 2 diabetes mellitus without complication, without long-term current use of insulin ([place]) 01/10/2007  . HYPERTENSION, BENIGN SYSTEMIC 01/10/2007  . Coronary atherosclerosis 01/10/2007  . Chronic systolic heart failure ([place]) 01/10/2007  . Asthma 01/10/2007  . Reflux esophagitis 01/10/2007  . Osteoarthritis, multiple sites 01/10/2007    Past Surgical History:  Procedure Laterality Date  . Breast masectomy  10/2010   Bilateral masectomy by Dr Lucia Gaskins s/p invasive ductal carcinoma.  Marland Kitchen BREAST SURGERY    . Cardiac  Catherization  2007   Severe 3-vessel disease.  EF 20-25%.  . ESOPHAGOGASTRODUODENOSCOPY  2001   Esophageal Tear  . ESOPHAGOGASTRODUODENOSCOPY  10/27/2012   Procedure: ESOPHAGOGASTRODUODENOSCOPY (EGD);  Surgeon: Irene Shipper, MD;  Location: [facility] ENDOSCOPY;  Service: Endoscopy;  Laterality: N/A;  pat  . JOINT REPLACEMENT Right 2001   Knee  . TOTAL KNEE ARTHROPLASTY  2001     OB History   None      Home Medications    Prior to Admission medications   Medication Sig Start Date End Date Taking? Authorizing Provider  acetaminophen (TYLENOL) 500 MG tablet Take 500 mg by mouth every 6 (six) hours as needed for moderate pain.     [provider]  albuterol (PROVENTIL HFA;VENTOLIN HFA) 108 (90 BASE) MCG/ACT inhaler Inhale 2 puffs into the lungs every 6 (six) hours as needed. For shortness of breath Patient not taking: Reported on 03/11/2018 01/18/15   [name], MD  Amino Acids-Protein Hydrolys (FEEDING SUPPLEMENT, PRO-STAT SUGAR FREE 64,) LIQD Take 30 mLs by mouth 2 (two) times daily with a meal. ensure    [provider]  amLODipine (NORVASC) 5 MG  tablet Take 1 tablet (5 mg total) by mouth daily. 12/21/17   [place] Bing, DO  aspirin (ASPIRIN CHILDRENS) 81 MG chewable tablet Chew 81 mg by mouth daily.     [provider]  capsaicin (ZOSTRIX) 0.025 % cream APPLY TO AFFECTED AREA TWICE A DAY 04/11/16   [name], MD  carvedilol (COREG) 12.5 MG tablet TAKE 1 TABLET BY MOUTH TWICE A DAY WITH A MEAL Patient taking differently: Take 12.5 mg by mouth two times a day with a meal 10/25/17   [place] Bing, DO  ferrous sulfate 325 (65 FE) MG tablet Take 1 tablet (325 mg total) by mouth daily with breakfast. 11/21/16   [name], MD  guaifenesin (ROBITUSSIN) 100 MG/5ML syrup Take 10 mLs (200 mg total) by mouth 3 (three) times daily as needed for cough. 08/15/16   [name], MD  hydroxypropyl methylcellulose / hypromellose (ISOPTO TEARS / GONIOVISC) 2.5 % ophthalmic  solution Place 1 drop 3 (three) times daily into both eyes. 09/28/17   [place] Bing, DO  loratadine (CLARITIN) 10 MG tablet TAKE 1/2 TABLET BY MOUTH DAILY 12/08/15   [name], MD  losartan (COZAAR) 50 MG tablet TAKE 1 TABLET (50 MG TOTAL) BY MOUTH AT BEDTIME. 12/21/17   [place] Bing, DO  metFORMIN (GLUCOPHAGE) 500 MG tablet Take 1 tablet (500 mg total) by mouth daily with breakfast. 07/31/17   [place] Bing, DO  PATADAY 0.2 % SOLN APPLY 1 DROP TO EYE DAILY. 11/23/15   [name], MD  petrolatum-hydrophilic-aloe vera (ALOE VESTA) ointment Apply topically 2 (two) times daily as needed for wound care. 05/10/17   [place] Catchings, Amy V, PA-C  ranitidine (ZANTAC) 150 MG tablet TAKE 1 TABLET BY MOUTH TWICE A DAY Patient taking differently: 150MG  BY MOUTH TWICE DAILY 10/15/17   [place] Bing, DO  rosuvastatin (CRESTOR) 20 MG tablet TAKE 1 TABLET BY MOUTH EVERYDAY AT BEDTIME 01/18/18   [place] Bing, DO  senna-docusate (SENOKOT-S) 8.6-50 MG tablet Take 2 tablets by mouth at bedtime. Patient taking differently: Take 1 tablet by mouth every other day.   04/23/17   [place] Crews, MD  silver sulfADIAZINE (SILVADENE) 1 % cream Apply 1 application topically daily. 05/15/17   [place], Richard Loletha Grayer, DPM  traMADol (ULTRAM) 50 MG tablet Take 1 tablet (50 mg total) by mouth every 8 (eight) hours as needed for severe pain. 05/06/18   [place] Bing, DO  cetirizine (ZYRTEC) 5 MG tablet Take 1 tablet (5 mg total) by mouth daily. Take at night time as it can cause some drowsiness 05/24/12 07/09/12  [place], Burnell Blanks, MD    Family History Family History  Problem Relation Age of Onset  . Sickle cell anemia Other   . Heart disease Mother   . Heart disease Father   . Cancer Daughter 86       breast ca  . Hypertension Daughter   . Cancer Daughter 75       breast ca  . Hypertension Daughter   . Heart disease Son        Poor circulation-Left Leg  . Diabetes Daughter   . Hypertension Daughter   . Hyperlipidemia Daughter        Poor circulation- Toe amputation    Social History Social History   Tobacco Use  . Smoking status: Never Smoker  . Smokeless tobacco: Current User    Types: Snuff  Substance Use Topics  . Alcohol use: No  . Drug use: No     Allergies   Tape and Lisinopril   Review of Systems Review of Systems  Neurological: Positive for weakness.  Psychiatric/Behavioral: Positive for confusion.  All other systems reviewed and are negative.    Physical Exam Updated Vital Signs Ht 4\' 11"  (1.499 m)   Wt 49.9 kg (110 lb)   SpO2 98%   BMI 22.22 kg/m   Physical Exam  Constitutional: She is oriented to person, place, and time. She appears well-developed and well-nourished. No distress.  HENT:  Head: Normocephalic and atraumatic.  Mouth/Throat: Oropharynx is clear and moist.  Eyes: Pupils are equal, round, and reactive to light. Conjunctivae and EOM are normal.  Neck: Normal range of motion. Neck supple.  Cardiovascular: Normal rate, regular rhythm and normal heart sounds.  Pulmonary/Chest: Effort normal and breath  sounds normal. No respiratory distress.  Abdominal: Soft. She exhibits no distension. There is no tenderness.  Musculoskeletal: Normal range  of motion. She exhibits no edema or deformity.  Neurological: She is alert and oriented to person, place, and time.  AOx3 Normal speech No focal deficits   VAN negative   Skin: Skin is warm and dry.  Psychiatric: She has a normal mood and affect.  Nursing note and vitals reviewed.    ED Treatments / Results  Labs (all labs ordered are listed, but only abnormal results are displayed) Labs Reviewed  COMPREHENSIVE METABOLIC PANEL - Abnormal; Notable for the following components:      Result Value   Potassium 3.4 (*)    Glucose, Bld 158 (*)    Calcium 11.2 (*)    Albumin 3.4 (*)    GFR calc non Af Amer 52 (*)    All other components within normal limits  CBC - Abnormal; Notable for the following components:   Hemoglobin 11.1 (*)    MCV 75.5 (*)    MCH 22.5 (*)    MCHC 29.8 (*)    RDW 17.1 (*)    All other components within normal limits  URINALYSIS, ROUTINE W REFLEX MICROSCOPIC - Abnormal; Notable for the following components:   APPearance HAZY (*)    Hgb urine dipstick SMALL (*)    Protein, ur 30 (*)    Nitrite POSITIVE (*)    Leukocytes, UA LARGE (*)    WBC, UA >50 (*)    Bacteria, UA RARE (*)    All other components within normal limits  CBG MONITORING, ED - Abnormal; Notable for the following components:   Glucose-Capillary 165 (*)    All other components within normal limits  I-STAT CHEM 8, ED - Abnormal; Notable for the following components:   Potassium 3.3 (*)    Glucose, Bld 155 (*)    Calcium, Ion 1.45 (*)    All other components within normal limits  ETHANOL  PROTIME-INR  APTT  DIFFERENTIAL  RAPID URINE DRUG SCREEN, HOSP PERFORMED  I-STAT TROPONIN, ED    EKG None  Radiology Ct Head Code Stroke Wo Contrast  Result Date: 06/03/2018 CLINICAL DATA:  Code stroke. Altered mental status, facial droop, vomiting.  History of RIGHT ICA aneurysm, breast cancer, colon cancer, hypertension, hyperlipidemia. EXAM: CT HEAD WITHOUT CONTRAST TECHNIQUE: Contiguous axial images were obtained from the base of the skull through the vertex without intravenous contrast. COMPARISON:  CT HEAD November 19, 2017 and MRI head November 25, 2015. FINDINGS: BRAIN: No intraparenchymal hemorrhage, mass effect nor midline shift. The ventricles and sulci are normal for age. Patchy supratentorial white matter hypodensities within normal range for patient's age, though non-specific are most compatible with chronic small vessel ischemic disease. Old RIGHT basal ganglia lacunar infarcts. Focal vermian atrophy. Old small LEFT cerebellar infarct. No acute large vascular territory infarcts. No abnormal extra-axial fluid collections. Basal cisterns are patent. VASCULAR: Severe calcific atherosclerosis of the carotid siphons. SKULL: No skull fracture. No significant scalp soft tissue swelling. SINUSES/ORBITS: Frothy secretions bilateral sphenoid sinuses.The included ocular globes and orbital contents are non-suspicious. Status post bilateral ocular lens implants. OTHER: None. ASPECTS [facility] Stroke Program Early CT Score) - Ganglionic level infarction (caudate, lentiform nuclei, internal capsule, insula, M1-M3 cortex): 7 - Supraganglionic infarction (M4-M6 cortex): 3 Total score (0-10 with 10 being normal): 10 IMPRESSION: 1. No acute intracranial process. 2. ASPECTS is 10. 3. Stable examination including moderate chronic small vessel ischemic changes and severe atherosclerosis. 4. Critical Value/emergent results were called by telephone at the time of interpretation on 06/03/2018 at 6:47 pm to Dr. Collier Salina  [place] , who verbally acknowledged these results. Electronically Signed   By: Elon Alas M.D.   On: 06/03/2018 18:48    Procedures Procedures (including critical care time) CRITICAL CARE Performed by: Valarie Merino   Total critical care time: 30   minutes  Critical care time was exclusive of separately billable procedures and treating other patients.  Critical care was necessary to treat or prevent imminent or life-threatening deterioration.  Critical care was time spent personally by me on the following activities: development of treatment plan with patient and/or surrogate as well as nursing, discussions with consultants, evaluation of patient's response to treatment, examination of patient, obtaining history from patient or surrogate, ordering and performing treatments and interventions, ordering and review of laboratory studies, ordering and review of radiographic studies, pulse oximetry and re-evaluation of patient's condition.   Medications Ordered in ED Medications - No data to display   Initial Impression / Assessment and Plan / ED Course  I have reviewed the triage vital signs and the nursing notes.  Pertinent labs & imaging results that were available during my care of the patient were reviewed by me and considered in my medical decision making (see chart for details).     MDM  Screen complete  Patient is presenting for evaluation of transient mental status change and possible left facial droop.  Code stroke initiated.  Neurology has evaluated the patient.  Patient is not a candidate for TPA.  Patient will be admitted for further work-up and treatment.  Medicine service is aware of case and will evaluate for admission.   Final Clinical Impressions(s) / ED Diagnoses   Final diagnoses:  TIA (transient ischemic attack)    ED Discharge Orders    None       Valarie Merino, MD 06/03/18 2020

## 2018-06-04 ENCOUNTER — Observation Stay (HOSPITAL_COMMUNITY): Payer: Medicare Other

## 2018-06-04 ENCOUNTER — Other Ambulatory Visit: Payer: Self-pay

## 2018-06-04 ENCOUNTER — Observation Stay (HOSPITAL_BASED_OUTPATIENT_CLINIC_OR_DEPARTMENT_OTHER): Payer: Medicare Other

## 2018-06-04 DIAGNOSIS — I351 Nonrheumatic aortic (valve) insufficiency: Secondary | ICD-10-CM | POA: Diagnosis not present

## 2018-06-04 DIAGNOSIS — G459 Transient cerebral ischemic attack, unspecified: Secondary | ICD-10-CM | POA: Diagnosis not present

## 2018-06-04 DIAGNOSIS — D509 Iron deficiency anemia, unspecified: Secondary | ICD-10-CM

## 2018-06-04 DIAGNOSIS — I6502 Occlusion and stenosis of left vertebral artery: Secondary | ICD-10-CM | POA: Diagnosis not present

## 2018-06-04 DIAGNOSIS — I671 Cerebral aneurysm, nonruptured: Secondary | ICD-10-CM | POA: Diagnosis not present

## 2018-06-04 DIAGNOSIS — R4182 Altered mental status, unspecified: Secondary | ICD-10-CM | POA: Diagnosis not present

## 2018-06-04 LAB — BASIC METABOLIC PANEL
ANION GAP: 4 — AB (ref 5–15)
BUN: 7 mg/dL — AB (ref 8–23)
CHLORIDE: 111 mmol/L (ref 98–111)
CO2: 25 mmol/L (ref 22–32)
Calcium: 10.3 mg/dL (ref 8.9–10.3)
Creatinine, Ser: 0.98 mg/dL (ref 0.44–1.00)
GFR calc Af Amer: 56 mL/min — ABNORMAL LOW (ref >60)
GFR calc non Af Amer: 49 mL/min — ABNORMAL LOW (ref >60)
GLUCOSE: 113 mg/dL — AB (ref 70–99)
POTASSIUM: 4.1 mmol/L (ref 3.5–5.1)
Sodium: 140 mmol/L (ref 135–145)

## 2018-06-04 LAB — CBC
HEMATOCRIT: 32.1 % — AB (ref 36.0–46.0)
Hemoglobin: 9.7 g/dL — ABNORMAL LOW (ref 12.0–15.0)
MCH: 22.6 pg — ABNORMAL LOW (ref 26.0–34.0)
MCHC: 30.2 g/dL (ref 30.0–36.0)
MCV: 74.7 fL — AB (ref 78.0–100.0)
PLATELETS: 131 10*3/uL — AB (ref 150–400)
RBC: 4.3 MIL/uL (ref 3.87–5.11)
RDW: 17 % — ABNORMAL HIGH (ref 11.5–15.5)
WBC: 4.5 10*3/uL (ref 4.0–10.5)

## 2018-06-04 LAB — LIPID PANEL
CHOL/HDL RATIO: 2.8 ratio
CHOLESTEROL: 78 mg/dL (ref 0–200)
HDL: 28 mg/dL — ABNORMAL LOW (ref >40)
LDL Cholesterol: 32 mg/dL (ref 0–99)
Triglycerides: 91 mg/dL (ref <150)
VLDL: 18 mg/dL (ref 0–40)

## 2018-06-04 LAB — ECHOCARDIOGRAM COMPLETE
HEIGHTINCHES: 62 in
Weight: 1865.9734 oz

## 2018-06-04 LAB — HEMOGLOBIN A1C
Hgb A1c MFr Bld: 6.7 % — ABNORMAL HIGH (ref 4.8–5.6)
Mean Plasma Glucose: 145.59 mg/dL

## 2018-06-04 LAB — RETICULOCYTES
RBC.: 4.26 MIL/uL (ref 3.87–5.11)
RETIC CT PCT: 1.3 % (ref 0.4–3.1)
Retic Count, Absolute: 55.4 10*3/uL (ref 19.0–186.0)

## 2018-06-04 LAB — IRON AND TIBC
Iron: 50 ug/dL (ref 28–170)
SATURATION RATIOS: 18 % (ref 10.4–31.8)
TIBC: 276 ug/dL (ref 250–450)
UIBC: 226 ug/dL

## 2018-06-04 LAB — TSH: TSH: 2.268 u[IU]/mL (ref 0.350–4.500)

## 2018-06-04 LAB — FOLATE: FOLATE: 6.4 ng/mL (ref >5.9)

## 2018-06-04 LAB — FERRITIN: Ferritin: 23 ng/mL (ref 11–307)

## 2018-06-04 MED ORDER — GADOBENATE DIMEGLUMINE 529 MG/ML IV SOLN
11.0000 mL | Freq: Once | INTRAVENOUS | Status: AC
  Administered 2018-06-04: 11 mL via INTRAVENOUS

## 2018-06-04 NOTE — Progress Notes (Signed)
  Echocardiogram 2D Echocardiogram has been performed.  [name] 06/04/2018, 3:13 PM

## 2018-06-04 NOTE — Evaluation (Signed)
Speech Language Pathology Evaluation Patient Details Name: [name] MRN: [id number] DOB: 03/19/1924 Today's Date: 06/04/2018 Time: 0912-0925 SLP Time Calculation (min) (ACUTE ONLY): 13 min  Problem List:  Patient Active Problem List   Diagnosis Date Noted  . Microcytic anemia   . TIA (transient ischemic attack) 06/03/2018  . Mobility impaired 04/26/2018  . Disorientation   . Swelling of left hand 12/10/2017  . Cough in adult   . Left knee pain 08/09/2017  . Protein-calorie malnutrition, severe 02/06/2017  . Urinary tract infection without hematuria 02/03/2017  . Hypokalemia 10/13/2016  . Pressure injury of skin 08/31/2016  . Pulmonary embolus ([place]) 08/30/2016  . Mild dementia 06/01/2016  . Depression 01/20/2016  . Suicidal ideation 01/19/2016  . Prolapse of female pelvic organs 12/28/2015  . Leg weakness, bilateral 12/08/2015  . Weakness of right lower extremity 11/25/2015  . Dry eyes 10/28/2015  . (HFpEF) heart failure with preserved ejection fraction ([place])   . CAD in native artery   . Gout 05/14/2015  . Acute delirium 04/15/2015  . Dyslipidemia   . Essential hypertension   . Seasonal allergies 02/10/2015  . Stroke-like symptoms   . Stroke-like episode ([place]) 01/25/2015  . Syncope and collapse 01/25/2015  . Dizziness 09/19/2014  . Anemia of chronic illness 08/19/2014  . Acute superficial venous thrombosis of right lower extremity 08/19/2014  . Hypercalcemia 06/09/2014  . Breast cancer, left breast ([place]) 06/09/2014  . Breast cancer, right breast ([place]) 06/09/2014  . Edema of left lower extremity 03/30/2014  . PAD (peripheral artery disease) ([place]) 03/30/2014  . History of colon cancer 12/11/2013  . History of fall 05/15/2013  . Bilateral leg pain 03/07/2013  . Lower extremity edema 02/19/2013  . Debility 01/22/2013  . Stricture and stenosis of esophagus 10/27/2012  . Constipation 06/24/2012  . Thoracic back pain 05/24/2012  . Tobacco abuse 06/02/2011  .  History of breast cancer in female, bilateral.  Mastectomies 10/24/2010. 04/26/2011  . Hiatal hernia 03/22/2011  . Colon polyp 03/22/2011  . Poor circulation 03/22/2011  . Cough 01/31/2011  . 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 Neuropathy ([place]) 08/25/2009  . ANEMIA-IRON DEFICIENCY 04/29/2007  . HLD (hyperlipidemia) 01/29/2007  . Controlled type 2 diabetes mellitus without complication, without long-term current use of insulin ([place]) 01/10/2007  . HYPERTENSION, BENIGN SYSTEMIC 01/10/2007  . Coronary atherosclerosis 01/10/2007  . Chronic systolic heart failure ([place]) 01/10/2007  . Asthma 01/10/2007  . Reflux esophagitis 01/10/2007  . Osteoarthritis, multiple sites 01/10/2007   Past Medical History:  Past Medical History:  Diagnosis Date  . Allergy   . Anemia    Iron def anemia  . Arthritis   . Asthma   . Breast cancer ([place]) 10/2010   Invasive Ductal Carcinoma, s/p bilateral mastectomy, now on Tamoxifen. Followed by Dr Jamse Arn.   . Breast cancer ([place]) 2011   s/p Bilateral masectomy  . CAD (coronary artery disease)   . Carotid artery aneurysm ([place]) 08/2010   right ICA, 5 x 78mm  . CHF (congestive heart failure) (HCC)    Systolic   . Colon cancer ([place])   . Colon cancer ([place]) 12/11/2013  . DDD (degenerative disc disease), cervical   . Diabetes mellitus   . Diverticulosis   . GERD (gastroesophageal reflux disease)   . H/O: GI bleed   . Hiatal hernia   . History of colon cancer   . Hypercalcemia 06/09/2014  . Hyperlipidemia   . Hypertension   . Myocardial infarction ([place])   . Neuropathy   .  Pneumonia 03/2012  . Shortness of breath    Past Surgical History:  Past Surgical History:  Procedure Laterality Date  . Breast masectomy  10/2010   Bilateral masectomy by Dr Lucia Gaskins s/p invasive ductal carcinoma.  Marland Kitchen BREAST SURGERY    . Cardiac  Catherization  2007   Severe 3-vessel disease.  EF 20-25%.  . ESOPHAGOGASTRODUODENOSCOPY  2001   Esophageal Tear  .  ESOPHAGOGASTRODUODENOSCOPY  10/27/2012   Procedure: ESOPHAGOGASTRODUODENOSCOPY (EGD);  Surgeon: Irene Shipper, MD;  Location: [facility] ENDOSCOPY;  Service: Endoscopy;  Laterality: N/A;  pat  . JOINT REPLACEMENT Right 2001   Knee  . TOTAL KNEE ARTHROPLASTY  2001   HPI:  84 y.o. female with past medical history of chronic systolic heart failure, coronary artery disease, diabetes mellitus, hyperlipidemia, hypertension, neuropathy, colon cancer and breast cancer presents with AMS neuro workup underway - however no acute abnormalities observed as of this time.   Assessment / Plan / Recommendation Clinical Impression  Pt chart review and pt report, she demonstrates age related cognitive deficits and currently resides with daughter who provides care along with CNA. Pt's current abilites are unchanged from those documented in May 2019 and pt reports herself to be at baseline as well. No further skilled ST is indicated. ST to sign off.     SLP Assessment  SLP Recommendation/Assessment: Patient does not need any further Speech Lanaguage Pathology Services SLP Visit Diagnosis: Cognitive communication deficit (R41.841)    Follow Up Recommendations  24 hour supervision/assistance    Frequency and Duration           SLP Evaluation Cognition  Overall Cognitive Status: No family/caregiver present to determine baseline cognitive functioning Arousal/Alertness: Awake/alert Orientation Level: Oriented to person;Oriented to place;Oriented to situation;Disoriented to time Attention: Sustained Sustained Attention: Appears intact       Comprehension  Auditory Comprehension Overall Auditory Comprehension: Impaired at baseline Visual Recognition/Discrimination Discrimination: Not tested Reading Comprehension Reading Status: Not tested    Expression Expression Primary Mode of Expression: Verbal Verbal Expression Overall Verbal Expression: Appears within functional limits for tasks assessed Written  Expression Dominant Hand: Right Written Expression: Not tested   Oral / Motor  Oral Motor/Sensory Function Overall Oral Motor/Sensory Function: Within functional limits Motor Speech Overall Motor Speech: Appears within functional limits for tasks assessed Respiration: Within functional limits Phonation: Normal Resonance: Within functional limits Articulation: Within functional limitis Intelligibility: Intelligible Motor Planning: Witnin functional limits Motor Speech Errors: Not applicable   GO                    Dana Debo 06/04/2018, 10:11 AM

## 2018-06-04 NOTE — Progress Notes (Addendum)
STROKE TEAM PROGRESS NOTE  H{I ( Dr Aroor) [name] is an 84 y.o. female with past medical history of chronic systolic heart failure, coronary artery disease, diabetes mellitus, hyperlipidemia, hypertension, neuropathy, colon cancer and breast cancer presents to the emergency room after her daughter found her not acting herself and noticed left facial droop.   Last seen normal was unclear but around 3 PM patient was with relatives and talking.  Around 4:30 PM her daughter went to see her and noticed she had left facial droop and was altered.  In the emergency room patient was not answering questions appropriately and was stroke alerted.  On assessment at bedside patient had improved significantly and was speaking in clear sentences.  No obvious facial droop was noted and patient moving all 4 extremities.  Head CT was obtained which showed no acute abnormality/hemorrhage.  TPA was not administered due to poor functional baseline and improving symptoms  Date last known well: 7.22.19 Time last known well: Around 3pm tPA Given: no, improving symptoms and poor MRS NIHSS: 4 Baseline MRS 4    INTERVAL HISTORY No family is at the bedside.  Patient is up in the chair, talkative.  States she is bedbound at home.  Daughter provides care for her.  States she is back to her baseline.  Vitals:   06/04/18 0000 06/04/18 0200 06/04/18 0400 06/04/18 0600  BP: (!) 118/47 (!) 127/39 (!) 123/45 (!) 167/66  Pulse:  62 (!) 58 69  Resp: 18 18 19 18   Temp:    97.9 F (36.6 C)  TempSrc:    Oral  SpO2: 98% 99% 99% 100%  Weight:      Height:        CBC:  Recent Labs  Lab 06/03/18 1817 06/03/18 1824 06/04/18 0418  WBC 4.8  --  4.5  NEUTROABS 2.2  --   --   HGB 11.1* 12.6 9.7*  HCT 37.2 37.0 32.1*  MCV 75.5*  --  74.7*  PLT 172  --  131*    Basic Metabolic Panel:  Recent Labs  Lab 06/03/18 1817 06/03/18 1824 06/04/18 0418  NA 140 142 140  K 3.4* 3.3* 4.1  CL 106 106 111  CO2 24   --  25  GLUCOSE 158* 155* 113*  BUN 9 11 7*  CREATININE 0.92 0.90 0.98  CALCIUM 11.2*  --  10.3   Lipid Panel:     Component Value Date/Time   CHOL 78 06/04/2018 0418   TRIG 91 06/04/2018 0418   HDL 28 (L) 06/04/2018 0418   CHOLHDL 2.8 06/04/2018 0418   VLDL 18 06/04/2018 0418   LDLCALC 32 06/04/2018 0418   HgbA1c:  Lab Results  Component Value Date   HGBA1C 6.7 (H) 06/04/2018   Urine Drug Screen:     Component Value Date/Time   LABOPIA NONE DETECTED 06/03/2018 1900   COCAINSCRNUR NONE DETECTED 06/03/2018 1900   LABBENZ NONE DETECTED 06/03/2018 1900   AMPHETMU NONE DETECTED 06/03/2018 1900   THCU NONE DETECTED 06/03/2018 1900   LABBARB NONE DETECTED 06/03/2018 1900    Alcohol Level     Component Value Date/Time   ETH <10 06/03/2018 1817    IMAGING Ct Head Code Stroke Wo Contrast  Result Date: 06/03/2018 CLINICAL DATA:  Code stroke. Altered mental status, facial droop, vomiting. History of RIGHT ICA aneurysm, breast cancer, colon cancer, hypertension, hyperlipidemia. EXAM: CT HEAD WITHOUT CONTRAST TECHNIQUE: Contiguous axial images were obtained from the base of the skull through  the vertex without intravenous contrast. COMPARISON:  CT HEAD November 19, 2017 and MRI head November 25, 2015. FINDINGS: BRAIN: No intraparenchymal hemorrhage, mass effect nor midline shift. The ventricles and sulci are normal for age. Patchy supratentorial white matter hypodensities within normal range for patient's age, though non-specific are most compatible with chronic small vessel ischemic disease. Old RIGHT basal ganglia lacunar infarcts. Focal vermian atrophy. Old small LEFT cerebellar infarct. No acute large vascular territory infarcts. No abnormal extra-axial fluid collections. Basal cisterns are patent. VASCULAR: Severe calcific atherosclerosis of the carotid siphons. SKULL: No skull fracture. No significant scalp soft tissue swelling. SINUSES/ORBITS: Frothy secretions bilateral sphenoid  sinuses.The included ocular globes and orbital contents are non-suspicious. Status post bilateral ocular lens implants. OTHER: None. ASPECTS [facility] Stroke Program Early CT Score) - Ganglionic level infarction (caudate, lentiform nuclei, internal capsule, insula, M1-M3 cortex): 7 - Supraganglionic infarction (M4-M6 cortex): 3 Total score (0-10 with 10 being normal): 10 IMPRESSION: 1. No acute intracranial process. 2. ASPECTS is 10. 3. Stable examination including moderate chronic small vessel ischemic changes and severe atherosclerosis. 4. Critical Value/emergent results were called by telephone at the time of interpretation on 06/03/2018 at 6:47 pm to Dr. Dene Gentry , who verbally acknowledged these results. Electronically Signed   By: Elon Alas M.D.   On: 06/03/2018 18:48    PHYSICAL EXAM Frail elderly african american lady not in distress. . Afebrile. Head is nontraumatic. Neck is supple without bruit.    Cardiac exam no murmur or gallop. Lungs are clear to auscultation. Distal pulses are well felt. Neurological Exam ;  Awake  Alert oriented x 2.  Diminished attention, registration and recall.  Poor insight into the illness.  Normal speech and language.eye movements full without nystagmus.fundi were not visualized. Vision acuity and fields appear normal. Hearing is normal. Palatal movements are normal. Face symmetric. Tongue midline. Normal strength, tone, reflexes and coordination.  Slightly diminished fine finger movements on the left and orbits right over left upper extremity.  Normal sensation. Gait deferred.  ASSESSMENT/PLAN [name] is a 84 y.o. female with history of compensated systolic heart failure, CAD, DB, HLD, HTN, colon cancer and breast cancer presenting with slurred speech and L HP.   Right brain subcortical stroke vs TIA:  Workup underway  Code Stroke CT head No acute stroke. Small vessel disease.  Severe atherosclerosis.ASPECTS 10.     MRI  pending   MRA  head and neck pending   2D Echo  pending   LDL 32  HgbA1c 6.7  Lovenox 40 mg sq daily for VTE prophylaxis  aspirin 81 mg daily prior to admission, now on aspirin 325 mg daily and clopidogrel 75 mg daily. Consider DAPT if positive for acute infarct. Recommendations pending   Therapy recommendations: 24-hour supervision, no therapy needs (bedridden, needs assist to get up)  Disposition: Return home  (lives w/ dtr)  Hypertension  Stable . BP goal normotensive  Hyperlipidemia  Home meds:  crestor 20, resumed in hospital  LDL 32, goal < 70  Continue statin at discharge  Diabetes type II  HgbA1c 6.7, goal < 7.0  Controlled  Other Stroke Risk Factors  Advanced age  Hx stroke/TIA  Hx TIAs "back in the day" per pt  Coronary artery disease s/p MI w stent in 2008  HFpEF, chronic  Compensated systolic congestive heart failure  Other Active Problems  Bacteruria  Anemia, acute  GERD  History of colon and breast cancer  Hospital day # 0  Burnetta Sabin, MSN, APRN, ANVP-BC, AGPCNP-BC Advanced Practice Stroke Nurse [place] for Schedule & Pager information 06/04/2018 2:50 PM  I have personally examined this patient, reviewed notes, independently viewed imaging studies, participated in medical decision making and plan of care.ROS completed by me personally and pertinent positives fully documented  I have made any additions or clarifications directly to the above note. Agree with note above. She presented with transient change in mental status and left facial droop possibly from right brain TIA or small stroke.  Recommend dual antiplatelet therapy and continue ongoing stroke work-up.  No family available at the bedside for discussion.  Greater than 50% time during this 25-minute visit was spent on counseling and coordination of care about stroke and TIA and answering questions.  Antony Contras, MD Medical Director [facility]  [facility] Pager: [phone] 06/04/2018 4:48 PM  To contact Stroke Continuity provider, please refer to [email or web address] After hours, contact General Neurology

## 2018-06-04 NOTE — Progress Notes (Signed)
Family Medicine Teaching Service Daily Progress Note Intern Pager: [phone]  [name] Medical record number: [id number] Date of birth: 07/12/1924 Age: 84 y.o. Gender: female  Primary Care Provider:  Bing, DO Consultants: Neuro Code Status: DNR  Pt Overview and Major Events to Date:  7/22 Admitted with TIA  Assessment and Plan: NIAMBI SMOAK is a 84 yo female presenting with facial droop and AMS.  PMH significant for T2DM, HTN, CHF, HLD, and GERD, Hx Bilateral breast cancer and colon cancer.  TIA Noted to have facial droop and became non-verbal at 5pm.  Symptoms had resolved in 2 hours.  Neurology following. Lipid panel WNL aside from HDL 32.  TSH 2.268.  Today patient neurologically intact.  A&Ox2, not to time, stable from yesterday. - f/u MRI brain  - f/u MRA head and neck - f/u TTE - started on ASA 325mg  and Plavix 75mg  QD (will determine 3 weeks vs 3 months) per neuro - cont home Crestor - f/u PT/OT/SLP  Bacteruria UA positive for nitrites and leukocytes on admission.  Family noted AMS, otherwise no symptoms. S/P 1g Rocephin in ED. -f/u urine culture  Anemia: Acute Hgb 9.7 this AM, down from 12.6 on admission. Baseline around 10 per chart review. - order FOBT - Anemia Panel in AM  HFpEF: Chronic, Stable EF 50-55%.  On amlodipine, carvedilol, losartan at home. - holding home meds for permissive HTN  Hypokalemia: Resolved Received 20mEq K-dur.  K 4.1 this AM. -continue to monitor BMP  HTN: Stable Permissive HTN up to 220/110.  BP this AM 167/66. - cont to hold home HTN meds - continue to monitor BP  T2DM: Well-controlled A1c 6.7 - Hold home metformin - cont to monitor glucose on BMP  HLD: Stable  On crestor.  Lipid Panel low HDL 32, otherwise WNL. -Cont home Crestor  GERD: Chronic On ranitidine at home. - cont famotidine prn as inpatient  FEN/GI: Regular PPx: Lovenox  Disposition: pending clinical improvement  Subjective:   Patient states that she is feeling well this AM.  Denies any complaints.  Remembers why she is in the hospital and what happened prior to her admission.  Denies confusion.  Notes that she is not hungry this morning, but states that this is common for her.  Objective: Temp:  [97.4 F (36.3 C)-98.4 F (36.9 C)] 97.9 F (36.6 C) (07/23 0600) Pulse Rate:  [58-82] 69 (07/23 0600) Resp:  [14-22] 18 (07/23 0600) BP: (118-174)/(39-90) 167/66 (07/23 0600) SpO2:  [98 %-100 %] 100 % (07/23 0600) Weight:  [110 lb (49.9 kg)-116 lb 10 oz (52.9 kg)] 116 lb 10 oz (52.9 kg) (07/22 2146)  Physical Exam: General: 84 yo female, lying in bed, in NAD Cardiovascular: RRR no m/r/g Respiratory: CTAB no wheezing, rhonchi, crackles Abdomen: Soft, non-tender to palpation, + bowel sounds Extremities: 5/5 strength BUE, BLE Neuro: CN II-XII grossly intact, normal speech, A&Ox2 to person and place  Laboratory: Recent Labs  Lab 06/03/18 1817 06/03/18 1824 06/04/18 0418  WBC 4.8  --  4.5  HGB 11.1* 12.6 9.7*  HCT 37.2 37.0 32.1*  PLT 172  --  131*   Recent Labs  Lab 06/03/18 1817 06/03/18 1824 06/04/18 0418  NA 140 142 140  K 3.4* 3.3* 4.1  CL 106 106 111  CO2 24  --  25  BUN 9 11 7*  CREATININE 0.92 0.90 0.98  CALCIUM 11.2*  --  10.3  PROT 7.7  --   --  BILITOT 0.5  --   --   ALKPHOS 59  --   --   ALT 10  --   --   AST 16  --   --   GLUCOSE 158* 155* 113*    Imaging/Diagnostic Tests: Ct Head Code Stroke Wo Contrast  Result Date: 06/03/2018 CLINICAL DATA:  Code stroke. Altered mental status, facial droop, vomiting. History of RIGHT ICA aneurysm, breast cancer, colon cancer, hypertension, hyperlipidemia. EXAM: CT HEAD WITHOUT CONTRAST TECHNIQUE: Contiguous axial images were obtained from the base of the skull through the vertex without intravenous contrast. COMPARISON:  CT HEAD November 19, 2017 and MRI head November 25, 2015. FINDINGS: BRAIN: No intraparenchymal hemorrhage, mass effect nor  midline shift. The ventricles and sulci are normal for age. Patchy supratentorial white matter hypodensities within normal range for patient's age, though non-specific are most compatible with chronic small vessel ischemic disease. Old RIGHT basal ganglia lacunar infarcts. Focal vermian atrophy. Old small LEFT cerebellar infarct. No acute large vascular territory infarcts. No abnormal extra-axial fluid collections. Basal cisterns are patent. VASCULAR: Severe calcific atherosclerosis of the carotid siphons. SKULL: No skull fracture. No significant scalp soft tissue swelling. SINUSES/ORBITS: Frothy secretions bilateral sphenoid sinuses.The included ocular globes and orbital contents are non-suspicious. Status post bilateral ocular lens implants. OTHER: None. ASPECTS [facility] Stroke Program Early CT Score) - Ganglionic level infarction (caudate, lentiform nuclei, internal capsule, insula, M1-M3 cortex): 7 - Supraganglionic infarction (M4-M6 cortex): 3 Total score (0-10 with 10 being normal): 10 IMPRESSION: 1. No acute intracranial process. 2. ASPECTS is 10. 3. Stable examination including moderate chronic small vessel ischemic changes and severe atherosclerosis. 4. Critical Value/emergent results were called by telephone at the time of interpretation on 06/03/2018 at 6:47 pm to Dr. Dene Gentry , who verbally acknowledged these results. Electronically Signed   By: Elon Alas M.D.   On: 06/03/2018 18:48     Rittberger, Bernita Raisin, DO 06/04/2018, 6:54 AM PGY-1, [place] Intern pager: [phone], text pages welcome

## 2018-06-04 NOTE — Progress Notes (Signed)
Family Medicine Teaching Service Daily Progress Note Intern Pager: [phone]  [name] Medical record number: [id number] Date of birth: [birth year] Age: 84 y.o. Gender: female  Primary Care Provider: Loving Bing, DO Consultants: Neuro Code Status: DNR  Pt Overview and Major Events to Date:  7/22 Admitted with TIA  Assessment and Plan: [name] is a 84 yo female presenting with facial droop and AMS.  PMH significant for T2DM, HTN, CHF, HLD, and GERD, Hx Bilateral breast cancer and colon cancer.  TIA Left sided facial droop, slurring of speech, and altered mental status for about 2 hrs on 7/22.  MRI moderate chronic small vessel ischemic disease, no acute process, old small basal ganglia, thalami, and left cerebellar infarcts. MRA showed severe left and moderate right ICA stenosis in the paraophthalmic segment, 86mm Right A1 2 junction aneurysm, moderate stenosis b/l MCA, 39mm aneurysm Left caverous ICA, 27mm fusiform aneurysm Right ICA. Echo EF 60-65%, G1DD, Moderate aortic regurg.  - f/u neuro recommendations - Cont on ASA 325mg  and Plavix 75mg  QD (will determine 3 weeks vs 3 months) per neuro - cont home Crestor - f/u PT/OT/SLP  Bacteruria UA positive for nitrites and leukocytes on admission.  S/P 1g Rocephin in ED.  Continues to be asymptomatic. -f/u urine culture  Anemia: Stable Hgb 10.6 this AM.  Anemia Panel WNL.  Likely mild thalassemia.  Supplementation unnecessary.  - FOBT pending -continue to monitor CBC  HFpEF: Chronic, Stable EF 60-65%, G1DD on 7/23.  On amlodipine, carvedilol, losartan at home. - will restart home meds as BP allows  HTN: Stable Per neuro, BP goal normotensive.  BP 153/86 this AM. - restart Coreg 12.5mg  BID  - Will add back other home BP meds as able  - continue to monitor BP  T2DM: Well-controlled A1c 6.7 - Hold home metformin - cont to monitor glucose on BMP  HLD: Stable On crestor.  -Cont home Crestor  GERD:  Chronic On ranitidine at home. - cont famotidine BID as inpatient  FEN/GI: Regular PPx: Lovenox  Disposition: pending clinical improvement  Subjective:  Patient states that she is feeling well this AM and is looking forward to being able to go home.  Denies any new confusion or weakness.  Objective: Temp:  [98 F (36.7 C)-98.3 F (36.8 C)] 98.3 F (36.8 C) (07/24 0419) Pulse Rate:  [78-86] 78 (07/24 0419) Resp:  [16-19] 18 (07/24 0419) BP: (123-159)/(68-92) 153/85 (07/24 0419) SpO2:  [98 %-100 %] 98 % (07/24 0419)  Physical Exam: General: 84 y.o. y.o. female in NAD Eyes: PERRL Cardio: RRR no m/r/g Lungs: CTAB, no wheezing, no rhonchi, no crackles Abdomen: Soft, non-tender to palpation, positive bowel sounds Skin: warm and dry Extremities: No edema, 5/5 strength Neuro: CN II-XII grossly intact, A&Ox2, to person and place only   Laboratory: Recent Labs  Lab 06/03/18 1817 06/03/18 1824 06/04/18 0418 06/05/18 0456  WBC 4.8  --  4.5 4.4  HGB 11.1* 12.6 9.7* 10.6*  HCT 37.2 37.0 32.1* 34.8*  PLT 172  --  131* 161   Recent Labs  Lab 06/03/18 1817 06/03/18 1824 06/04/18 0418 06/05/18 0456  NA 140 142 140 140  K 3.4* 3.3* 4.1 3.8  CL 106 106 111 110  CO2 24  --  25 23  BUN 9 11 7* 8  CREATININE 0.92 0.90 0.98 0.96  CALCIUM 11.2*  --  10.3 11.0*  PROT 7.7  --   --   --   BILITOT 0.5  --   --   --  ALKPHOS 59  --   --   --   ALT 10  --   --   --   AST 16  --   --   --   GLUCOSE 158* 155* 113* 127*    Imaging/Diagnostic Tests: Mr [name] Neck W Wo Contrast  Result Date: 06/04/2018 CLINICAL DATA:  Altered mental status. Assess TIA. History of breast and lung cancer, RIGHT ICA aneurysm, hypertension, hyperlipidemia, diabetes. EXAM: MRI HEAD WITHOUT CONTRAST MRA HEAD WITHOUT CONTRAST MRA NECK WITHOUT AND WITH CONTRAST TECHNIQUE: Multiplanar, multiecho pulse sequences of the brain and surrounding structures were obtained without intravenous contrast. Angiographic  images of the Circle of Willis were obtained using MRA technique without intravenous contrast. Angiographic images of the neck were obtained using MRA technique without and with intravenous contrast. Carotid stenosis measurements (when applicable) are obtained utilizing NASCET criteria, using the distal internal carotid diameter as the denominator. CONTRAST:  57mL MULTIHANCE GADOBENATE DIMEGLUMINE 529 MG/ML IV SOLN COMPARISON:  None. FINDINGS: MRI HEAD FINDINGS INTRACRANIAL CONTENTS: No reduced diffusion to suggest acute ischemia. A few scattered chronic microhemorrhages present. The ventricles and sulci are normal for patient's age. Old RIGHT basal ganglia and thalami lacunar infarcts. Old small LEFT cerebellar infarcts. Patchy to confluent supratentorial white matter FLAIR T2 hyperintensities. No suspicious parenchymal signal, masses, mass effect. No abnormal extra-axial fluid collections. No extra-axial masses. VASCULAR: Normal major intracranial vascular flow voids present at skull base. SKULL AND UPPER CERVICAL SPINE: No abnormal sellar expansion. No suspicious calvarial bone marrow signal. Craniocervical junction maintained. SINUSES/ORBITS: The mastoid air-cells and included paranasal sinuses are well-aerated.The included ocular globes and orbital contents are non-suspicious. Status post bilateral ocular lens implants. Patient is edentulous. MRA HEAD FINDINGS ANTERIOR CIRCULATION: Flow related enhancement of the included cervical, petrous, cavernous and supraclinoid internal carotid arteries. 3 mm L pouching LEFT cavernous ICA. Fusiform aneurysm RIGHT cavernous ICA measuring to 6 mm. Moderate RIGHT and severe stenosis LEFT paraophthalmic segments. 2 mm aneurysm RIGHT A1 2 junction. Patent anterior communicating artery. Patent anterior and middle cerebral arteries. Multifocal bilateral MCA stenosis. No large vessel occlusion, flow limiting stenosis, aneurysm. POSTERIOR CIRCULATION: LEFT vertebral artery is  dominant. Vertebrobasilar arteries are patent, with normal flow related enhancement of the main branch vessels. Moderate luminal irregularity LEFT vertebral artery and basilar artery compatible with atherosclerosis. Patent posterior cerebral arteries. Mild stenosis LEFT P2 segment. No large vessel occlusion, flow limiting stenosis. ANATOMIC VARIANTS: None. Source images and MIP images were reviewed. MRA NECK FINDINGS ANTERIOR CIRCULATION: Arch vessel origins are patent. Moderate stenosis bilateral subclavian arteries, LEFT axillary artery the common carotid arteries are widely patent bilaterally. The carotid bifurcations are patent bilaterally without hemodynamically significant stenosis by NASCET criteria. No flow limiting stenosis or luminal irregularity. POSTERIOR CIRCULATION: Bilateral vertebral arteries are patent to the vertebrobasilar junction. Severe stenosis LEFT V1 segment. Source images and MIP images were reviewed. IMPRESSION: MRI HEAD: 1. No acute intracranial process. 2. Moderate chronic small vessel ischemic disease. 3. Old small basal ganglia, thalami and LEFT cerebellar infarcts. MRA HEAD: 1. No emergent large vessel occlusion. 2. Severe LEFT and moderate RIGHT ICA stenosis, paraophthalmic segment. 2 mm RIGHT A1 2 junction aneurysm. 3. Multifocal moderate stenosis bilateral MCA, LEFT vertebral artery and basilar artery compatible with atherosclerosis. 4. 3 mm aneurysm LEFT cavernous ICA. 6 mm fusiform aneurysm RIGHT ICA. MRA NECK: 1. No hemodynamically significant stenosis internal carotid arteries. 2. Patent vertebral arteries, severe stenosis LEFT V1 segment. Electronically Signed   By: Thana Farr.D.  On: 06/04/2018 20:44   Mr Brain Wo Contrast  Result Date: 06/04/2018 CLINICAL DATA:  Altered mental status. Assess TIA. History of breast and lung cancer, RIGHT ICA aneurysm, hypertension, hyperlipidemia, diabetes. EXAM: MRI HEAD WITHOUT CONTRAST MRA HEAD WITHOUT CONTRAST MRA NECK  WITHOUT AND WITH CONTRAST TECHNIQUE: Multiplanar, multiecho pulse sequences of the brain and surrounding structures were obtained without intravenous contrast. Angiographic images of the Circle of Willis were obtained using MRA technique without intravenous contrast. Angiographic images of the neck were obtained using MRA technique without and with intravenous contrast. Carotid stenosis measurements (when applicable) are obtained utilizing NASCET criteria, using the distal internal carotid diameter as the denominator. CONTRAST:  52mL MULTIHANCE GADOBENATE DIMEGLUMINE 529 MG/ML IV SOLN COMPARISON:  None. FINDINGS: MRI HEAD FINDINGS INTRACRANIAL CONTENTS: No reduced diffusion to suggest acute ischemia. A few scattered chronic microhemorrhages present. The ventricles and sulci are normal for patient's age. Old RIGHT basal ganglia and thalami lacunar infarcts. Old small LEFT cerebellar infarcts. Patchy to confluent supratentorial white matter FLAIR T2 hyperintensities. No suspicious parenchymal signal, masses, mass effect. No abnormal extra-axial fluid collections. No extra-axial masses. VASCULAR: Normal major intracranial vascular flow voids present at skull base. SKULL AND UPPER CERVICAL SPINE: No abnormal sellar expansion. No suspicious calvarial bone marrow signal. Craniocervical junction maintained. SINUSES/ORBITS: The mastoid air-cells and included paranasal sinuses are well-aerated.The included ocular globes and orbital contents are non-suspicious. Status post bilateral ocular lens implants. Patient is edentulous. MRA HEAD FINDINGS ANTERIOR CIRCULATION: Flow related enhancement of the included cervical, petrous, cavernous and supraclinoid internal carotid arteries. 3 mm L pouching LEFT cavernous ICA. Fusiform aneurysm RIGHT cavernous ICA measuring to 6 mm. Moderate RIGHT and severe stenosis LEFT paraophthalmic segments. 2 mm aneurysm RIGHT A1 2 junction. Patent anterior communicating artery. Patent anterior and  middle cerebral arteries. Multifocal bilateral MCA stenosis. No large vessel occlusion, flow limiting stenosis, aneurysm. POSTERIOR CIRCULATION: LEFT vertebral artery is dominant. Vertebrobasilar arteries are patent, with normal flow related enhancement of the main branch vessels. Moderate luminal irregularity LEFT vertebral artery and basilar artery compatible with atherosclerosis. Patent posterior cerebral arteries. Mild stenosis LEFT P2 segment. No large vessel occlusion, flow limiting stenosis. ANATOMIC VARIANTS: None. Source images and MIP images were reviewed. MRA NECK FINDINGS ANTERIOR CIRCULATION: Arch vessel origins are patent. Moderate stenosis bilateral subclavian arteries, LEFT axillary artery the common carotid arteries are widely patent bilaterally. The carotid bifurcations are patent bilaterally without hemodynamically significant stenosis by NASCET criteria. No flow limiting stenosis or luminal irregularity. POSTERIOR CIRCULATION: Bilateral vertebral arteries are patent to the vertebrobasilar junction. Severe stenosis LEFT V1 segment. Source images and MIP images were reviewed. IMPRESSION: MRI HEAD: 1. No acute intracranial process. 2. Moderate chronic small vessel ischemic disease. 3. Old small basal ganglia, thalami and LEFT cerebellar infarcts. MRA HEAD: 1. No emergent large vessel occlusion. 2. Severe LEFT and moderate RIGHT ICA stenosis, paraophthalmic segment. 2 mm RIGHT A1 2 junction aneurysm. 3. Multifocal moderate stenosis bilateral MCA, LEFT vertebral artery and basilar artery compatible with atherosclerosis. 4. 3 mm aneurysm LEFT cavernous ICA. 6 mm fusiform aneurysm RIGHT ICA. MRA NECK: 1. No hemodynamically significant stenosis internal carotid arteries. 2. Patent vertebral arteries, severe stenosis LEFT V1 segment. Electronically Signed   By: Elon Alas M.D.   On: 06/04/2018 20:44   Mr [name] Head Wo Contrast  Result Date: 06/04/2018 CLINICAL DATA:  Altered mental status. Assess  TIA. History of breast and lung cancer, RIGHT ICA aneurysm, hypertension, hyperlipidemia, diabetes. EXAM: MRI HEAD WITHOUT CONTRAST MRA HEAD WITHOUT  CONTRAST MRA NECK WITHOUT AND WITH CONTRAST TECHNIQUE: Multiplanar, multiecho pulse sequences of the brain and surrounding structures were obtained without intravenous contrast. Angiographic images of the Circle of Willis were obtained using MRA technique without intravenous contrast. Angiographic images of the neck were obtained using MRA technique without and with intravenous contrast. Carotid stenosis measurements (when applicable) are obtained utilizing NASCET criteria, using the distal internal carotid diameter as the denominator. CONTRAST:  9mL MULTIHANCE GADOBENATE DIMEGLUMINE 529 MG/ML IV SOLN COMPARISON:  None. FINDINGS: MRI HEAD FINDINGS INTRACRANIAL CONTENTS: No reduced diffusion to suggest acute ischemia. A few scattered chronic microhemorrhages present. The ventricles and sulci are normal for patient's age. Old RIGHT basal ganglia and thalami lacunar infarcts. Old small LEFT cerebellar infarcts. Patchy to confluent supratentorial white matter FLAIR T2 hyperintensities. No suspicious parenchymal signal, masses, mass effect. No abnormal extra-axial fluid collections. No extra-axial masses. VASCULAR: Normal major intracranial vascular flow voids present at skull base. SKULL AND UPPER CERVICAL SPINE: No abnormal sellar expansion. No suspicious calvarial bone marrow signal. Craniocervical junction maintained. SINUSES/ORBITS: The mastoid air-cells and included paranasal sinuses are well-aerated.The included ocular globes and orbital contents are non-suspicious. Status post bilateral ocular lens implants. Patient is edentulous. MRA HEAD FINDINGS ANTERIOR CIRCULATION: Flow related enhancement of the included cervical, petrous, cavernous and supraclinoid internal carotid arteries. 3 mm L pouching LEFT cavernous ICA. Fusiform aneurysm RIGHT cavernous ICA measuring  to 6 mm. Moderate RIGHT and severe stenosis LEFT paraophthalmic segments. 2 mm aneurysm RIGHT A1 2 junction. Patent anterior communicating artery. Patent anterior and middle cerebral arteries. Multifocal bilateral MCA stenosis. No large vessel occlusion, flow limiting stenosis, aneurysm. POSTERIOR CIRCULATION: LEFT vertebral artery is dominant. Vertebrobasilar arteries are patent, with normal flow related enhancement of the main branch vessels. Moderate luminal irregularity LEFT vertebral artery and basilar artery compatible with atherosclerosis. Patent posterior cerebral arteries. Mild stenosis LEFT P2 segment. No large vessel occlusion, flow limiting stenosis. ANATOMIC VARIANTS: None. Source images and MIP images were reviewed. MRA NECK FINDINGS ANTERIOR CIRCULATION: Arch vessel origins are patent. Moderate stenosis bilateral subclavian arteries, LEFT axillary artery the common carotid arteries are widely patent bilaterally. The carotid bifurcations are patent bilaterally without hemodynamically significant stenosis by NASCET criteria. No flow limiting stenosis or luminal irregularity. POSTERIOR CIRCULATION: Bilateral vertebral arteries are patent to the vertebrobasilar junction. Severe stenosis LEFT V1 segment. Source images and MIP images were reviewed. IMPRESSION: MRI HEAD: 1. No acute intracranial process. 2. Moderate chronic small vessel ischemic disease. 3. Old small basal ganglia, thalami and LEFT cerebellar infarcts. MRA HEAD: 1. No emergent large vessel occlusion. 2. Severe LEFT and moderate RIGHT ICA stenosis, paraophthalmic segment. 2 mm RIGHT A1 2 junction aneurysm. 3. Multifocal moderate stenosis bilateral MCA, LEFT vertebral artery and basilar artery compatible with atherosclerosis. 4. 3 mm aneurysm LEFT cavernous ICA. 6 mm fusiform aneurysm RIGHT ICA. MRA NECK: 1. No hemodynamically significant stenosis internal carotid arteries. 2. Patent vertebral arteries, severe stenosis LEFT V1 segment.  Electronically Signed   By: Elon Alas M.D.   On: 06/04/2018 20:44   Ct Head Code Stroke Wo Contrast  Result Date: 06/03/2018 CLINICAL DATA:  Code stroke. Altered mental status, facial droop, vomiting. History of RIGHT ICA aneurysm, breast cancer, colon cancer, hypertension, hyperlipidemia. EXAM: CT HEAD WITHOUT CONTRAST TECHNIQUE: Contiguous axial images were obtained from the base of the skull through the vertex without intravenous contrast. COMPARISON:  CT HEAD November 19, 2017 and MRI head November 25, 2015. FINDINGS: BRAIN: No intraparenchymal hemorrhage, mass effect nor midline shift. The  ventricles and sulci are normal for age. Patchy supratentorial white matter hypodensities within normal range for patient's age, though non-specific are most compatible with chronic small vessel ischemic disease. Old RIGHT basal ganglia lacunar infarcts. Focal vermian atrophy. Old small LEFT cerebellar infarct. No acute large vascular territory infarcts. No abnormal extra-axial fluid collections. Basal cisterns are patent. VASCULAR: Severe calcific atherosclerosis of the carotid siphons. SKULL: No skull fracture. No significant scalp soft tissue swelling. SINUSES/ORBITS: Frothy secretions bilateral sphenoid sinuses.The included ocular globes and orbital contents are non-suspicious. Status post bilateral ocular lens implants. OTHER: None. ASPECTS [facility] Stroke Program Early CT Score) - Ganglionic level infarction (caudate, lentiform nuclei, internal capsule, insula, M1-M3 cortex): 7 - Supraganglionic infarction (M4-M6 cortex): 3 Total score (0-10 with 10 being normal): 10 IMPRESSION: 1. No acute intracranial process. 2. ASPECTS is 10. 3. Stable examination including moderate chronic small vessel ischemic changes and severe atherosclerosis. 4. Critical Value/emergent results were called by telephone at the time of interpretation on 06/03/2018 at 6:47 pm to Dr. Dene Gentry , who verbally acknowledged these results.  Electronically Signed   By: Elon Alas M.D.   On: 06/03/2018 18:48     Rittberger, Bernita Raisin, DO 06/05/2018, 6:46 AM PGY-1, [place] Intern pager: [phone], text pages welcome

## 2018-06-04 NOTE — Evaluation (Signed)
Occupational Therapy Evaluation Patient Details Name: [name] MRN: [id number] DOB: 03/14/1924 Today's Date: 06/04/2018    History of Present Illness 84 y.o. female with past medical history of chronic systolic heart failure, coronary artery disease, diabetes mellitus, hyperlipidemia, hypertension, neuropathy, colon cancer and breast cancer presents with AMS neuro workup underway.   Clinical Impression   Patient evaluated by Occupational Therapy with no further acute OT needs identified. All education has been completed and the patient has no further questions. See below for any follow-up Occupational Therapy or equipment needs. OT to sign off. Thank you for referral.      Follow Up Recommendations  No OT follow up    Equipment Recommendations  None recommended by OT    Recommendations for Other Services       Precautions / Restrictions Precautions Precautions: Fall      Mobility Bed Mobility Overal bed mobility: Needs Assistance Bed Mobility: Supine to Sit     Supine to sit: Min assist     General bed mobility comments: in chair on arrival  Transfers Overall transfer level: Needs assistance Equipment used: None Transfers: Sit to/from [employer] Sit to Stand: Min guard   Squat pivot transfers: Min guard     General transfer comment: min guard for safety  from bed to commode then commode to chair. No physical assist required. Increased time with significant flexed posture during transitional movement.    Balance Overall balance assessment: History of Falls                                         ADL either performed or assessed with clinical judgement   ADL Overall ADL's : At baseline                                       General ADL Comments: pt completed chair to 3n1 transfer. pt able to complete peri care with wash cloth seated. pt transfered back to chair. pt with warm blanket and reports "that  feels so good!" Pt provided warm milk and red velvet cake from lunch tray.      Vision         Perception     Praxis      Pertinent Vitals/Pain Pain Assessment: No/denies pain     Hand Dominance Right   Extremity/Trunk Assessment Upper Extremity Assessment Upper Extremity Assessment: Generalized weakness   Lower Extremity Assessment Lower Extremity Assessment: Generalized weakness   Cervical / Trunk Assessment Cervical / Trunk Assessment: Kyphotic   Communication Communication Communication: HOH   Cognition Arousal/Alertness: Awake/alert Behavior During Therapy: WFL for tasks assessed/performed Overall Cognitive Status: No family/caregiver present to determine baseline cognitive functioning                                     General Comments  pt continent on 3n1 and using appropriately    Exercises     Shoulder Instructions      Home Living Family/patient expects to be discharged to:: Private residence Living Arrangements: Children Available Help at Discharge: Family;Personal care attendant;Available PRN/intermittently Type of Home: House Home Access: Stairs to enter [employer] of Steps: 7 Entrance Stairs-Rails: Right;Left Home Layout: One level  Bathroom Shower/Tub: (sponge baths only)   Bathroom Toilet: Standard Bathroom Accessibility: Yes How Accessible: Accessible via wheelchair Home Equipment: Wheelchair - manual;Bedside commode   Additional Comments: per chart review from previous admission and current reports from patient.  pt does not ambulate but rather stand pivot transfers  Lives With: Daughter    Prior Functioning/Environment Level of Independence: Needs assistance  Gait / Transfers Assistance Needed: uses a w/c for mobility, requires assistance with transfers, requires assistance with bed mobility ADL's / Homemaking Assistance Needed: pt has an aide that comes every morning until noon 7 days a week (pt  unsure of what time arrives) that assists her with bathing and dressing and toileting. pt is able to feed herself Communication / Swallowing Assistance Needed: Pt did feed herself. Comments: Pt states her daughter will help her all the time.         OT Problem List:        OT Treatment/Interventions:      OT Goals(Current goals can be found in the care plan section)    OT Frequency:     Barriers to D/C:            Co-evaluation              AM-PAC PT "6 Clicks" Daily Activity     Outcome Measure Help from another person eating meals?: None Help from another person taking care of personal grooming?: A Lot Help from another person toileting, which includes using toliet, bedpan, or urinal?: A Lot Help from another person bathing (including washing, rinsing, drying)?: A Lot Help from another person to put on and taking off regular upper body clothing?: A Lot Help from another person to put on and taking off regular lower body clothing?: A Lot 6 Click Score: 14   End of Session Nurse Communication: Mobility status;Precautions  Activity Tolerance: Patient tolerated treatment well Patient left: in chair;with call bell/phone within reach;with chair alarm set  OT Visit Diagnosis: Unsteadiness on feet (R26.81)                Time: 9381-0175 OT Time Calculation (min): 14 min Charges:  OT General Charges $OT Visit: 1 Visit OT Evaluation $OT Eval Low Complexity: 1 Low G-CodesJeri Modena   OTR/L Pager: [phone] Office: [phone] .   Parke Poisson B 06/04/2018, 12:32 PM

## 2018-06-04 NOTE — Evaluation (Signed)
Physical Therapy Evaluation Patient Details Name: [name] MRN: [id number] DOB: 08/13/1924 Today's Date: 06/04/2018   History of Present Illness  84 y.o. female with past medical history of chronic systolic heart failure, coronary artery disease, diabetes mellitus, hyperlipidemia, hypertension, neuropathy, colon cancer and breast cancer presents with AMS neuro workup underway.  Clinical Impression  Orders received for PT evaluation. Patient demonstrates deficits in functional mobility as indicated below but per patient and chart, this is her baseline. She is non ambulatory and transfers with min guard min assist at home to w/c for mobility. Patient reports no new deficits. No further acute PT needs at this time. Will sign off.      Follow Up Recommendations Supervision/Assistance - 24 hour    Equipment Recommendations  None recommended by PT    Recommendations for Other Services       Precautions / Restrictions Precautions Precautions: Fall      Mobility  Bed Mobility Overal bed mobility: Needs Assistance Bed Mobility: Supine to Sit     Supine to sit: Min assist     General bed mobility comments: min assist for elevation to upright. Increased time to perform. Use of bed rail  Transfers Overall transfer level: Needs assistance Equipment used: None Transfers: Squat Pivot Transfers     Squat pivot transfers: Min guard     General transfer comment: min guard for safety  from bed to commode then commode to chair. No physical assist required. Increased time with significant flexed posture during transitional movement.  Ambulation/Gait             General Gait Details: non ambulatory  Stairs            Wheelchair Mobility    Modified Rankin (Stroke Patients Only)       Balance Overall balance assessment: History of Falls                                           Pertinent Vitals/Pain Pain Assessment: No/denies pain    Home  Living Family/patient expects to be discharged to:: Private residence Living Arrangements: Children Available Help at Discharge: Family;Personal care attendant;Available PRN/intermittently Type of Home: House Home Access: Stairs to enter Entrance Stairs-Rails: Psychiatric nurse of Steps: 7 Home Layout: One level Home Equipment: Wheelchair - manual;Bedside commode Additional Comments: pt reports she only transfers, does not ambulate and that she only transfers when someone is around so they can help her    Prior Function Level of Independence: Needs assistance   Gait / Transfers Assistance Needed: uses a w/c for mobility, requires assistance with transfers, requires assistance with bed mobility  ADL's / Homemaking Assistance Needed: pt has an aide that comes every morning until noon 7 days a week (pt unsure of what time arrives) that assists her with bathing and dressing and toileting. pt is able to feed herself        Hand Dominance   Dominant Hand: Right    Extremity/Trunk Assessment   Upper Extremity Assessment Upper Extremity Assessment: Generalized weakness    Lower Extremity Assessment Lower Extremity Assessment: Generalized weakness       Communication   Communication: HOH  Cognition Arousal/Alertness: Awake/alert Behavior During Therapy: WFL for tasks assessed/performed Overall Cognitive Status: No family/caregiver present to determine baseline cognitive functioning  General Comments General comments (skin integrity, edema, etc.): hygiene and pericare performed due to incontinence.    Exercises     Assessment/Plan    PT Assessment Patent does not need any further PT services(at her mobility baseline per patient and chart)  PT Problem List         PT Treatment Interventions      PT Goals (Current goals can be found in the Care Plan section)  Acute Rehab PT Goals PT Goal Formulation:  All assessment and education complete, DC therapy(patient at her mobility baseline)    Frequency     Barriers to discharge        Co-evaluation               AM-PAC PT "6 Clicks" Daily Activity  Outcome Measure Difficulty turning over in bed (including adjusting bedclothes, sheets and blankets)?: A Little Difficulty moving from lying on back to sitting on the side of the bed? : Unable Difficulty sitting down on and standing up from a chair with arms (e.g., wheelchair, bedside commode, etc,.)?: A Little Help needed moving to and from a bed to chair (including a wheelchair)?: A Little Help needed walking in hospital room?: A Lot Help needed climbing 3-5 steps with a railing? : Total 6 Click Score: 13    End of Session   Activity Tolerance: Patient tolerated treatment well Patient left: in chair;with call bell/phone within reach;with chair alarm set Nurse Communication: Mobility status      Time: 2094-7096 PT Time Calculation (min) (ACUTE ONLY): 20 min   Charges:   PT Evaluation $PT Eval Moderate Complexity: 1 Mod     PT G Codes:        Alben Deeds, PT DPT  Board Certified Neurologic Specialist [phone]   Duncan Dull 06/04/2018, 9:56 AM

## 2018-06-05 ENCOUNTER — Ambulatory Visit: Payer: Self-pay

## 2018-06-05 DIAGNOSIS — G459 Transient cerebral ischemic attack, unspecified: Secondary | ICD-10-CM | POA: Diagnosis not present

## 2018-06-05 LAB — BASIC METABOLIC PANEL
Anion gap: 7 (ref 5–15)
BUN: 8 mg/dL (ref 8–23)
CO2: 23 mmol/L (ref 22–32)
Calcium: 11 mg/dL — ABNORMAL HIGH (ref 8.9–10.3)
Chloride: 110 mmol/L (ref 98–111)
Creatinine, Ser: 0.96 mg/dL (ref 0.44–1.00)
GFR calc Af Amer: 58 mL/min — ABNORMAL LOW (ref >60)
GFR calc non Af Amer: 50 mL/min — ABNORMAL LOW (ref >60)
Glucose, Bld: 127 mg/dL — ABNORMAL HIGH (ref 70–99)
Potassium: 3.8 mmol/L (ref 3.5–5.1)
Sodium: 140 mmol/L (ref 135–145)

## 2018-06-05 LAB — CBC
HCT: 34.8 % — ABNORMAL LOW (ref 36.0–46.0)
HEMOGLOBIN: 10.6 g/dL — AB (ref 12.0–15.0)
MCH: 22.6 pg — ABNORMAL LOW (ref 26.0–34.0)
MCHC: 30.5 g/dL (ref 30.0–36.0)
MCV: 74.4 fL — ABNORMAL LOW (ref 78.0–100.0)
Platelets: 161 10*3/uL (ref 150–400)
RBC: 4.68 MIL/uL (ref 3.87–5.11)
RDW: 17 % — ABNORMAL HIGH (ref 11.5–15.5)
WBC: 4.4 10*3/uL (ref 4.0–10.5)

## 2018-06-05 LAB — VITAMIN B12: Vitamin B-12: 381 pg/mL (ref 180–914)

## 2018-06-05 MED ORDER — CLOPIDOGREL BISULFATE 75 MG PO TABS: 75.0000 mg | ORAL_TABLET | Freq: Every day | ORAL | 0 refills | Status: AC

## 2018-06-05 MED ORDER — CARVEDILOL 12.5 MG PO TABS: 12.5000 mg | ORAL_TABLET | Freq: Two times a day (BID) | ORAL | Status: DC

## 2018-06-05 MED ORDER — ASPIRIN 325 MG PO TABS: 325.0000 mg | ORAL_TABLET | Freq: Every day | ORAL | 0 refills | Status: DC

## 2018-06-05 MED ORDER — PANTOPRAZOLE SODIUM 40 MG PO TBEC: 40.0000 mg | DELAYED_RELEASE_TABLET | Freq: Every day | ORAL | 0 refills | Status: DC

## 2018-06-05 NOTE — Consult Note (Addendum)
   [facility] CM Inpatient Consult   06/05/2018  [name] 26-Aug-1924 [id number]   Patient active with [place] Management program. She is followed by [place] Worker for [employer]. She was previously active with [facility]. Please see chart review tab then encounter for patient outreach details.  Spoke with patient at bedside. She asks that Probation officer contact her daughter [name] to see if any additional [facility] Care Management services are needed. Currently active with [facility] Social Worker.  Mrs. Rutan endorses that she is wheelchair bound and she uses [employer] now for transportation.   Telephone call to [name] at [phone] to discuss [place] Management. No answer. Left HIPPA compliant voicemail message to request call back.  Made inpatient RNCM aware of all of above notes.   Addendum: Daughter [name] returned call. States patient has aide services 7 days a week for 2hrs a day. Also reports she is applying with [employer] for additional hours. [name] denies needing any further [facility] Care Management services other than [facility] BSW follow up for community resources. Also reports patient is active with [facility] for home health.  Addendum at 1206 pm: Telephone call back to daughter to confirm Primary Care MD. [name] indicates Primary Care Provider remains Dr. Yisroel Ramming with [facility].  Discussed all of the above with inpatient RNCM.   Marthenia Rolling, MSN-Ed, RN,BSN [facility] Liaison [phone]

## 2018-06-05 NOTE — Progress Notes (Signed)
STROKE TEAM PROGRESS NOTE      INTERVAL HISTORY No family is at the bedside.  Patient is up in the chair, talkative.  States she is bedbound at home.   Cardiac echocardiogram was unremarkable.  Vitals:   06/04/18 2321 06/05/18 0419 06/05/18 0920 06/05/18 1139  BP: (!) 159/72 (!) 153/85 (!) 150/77 (!) 144/81  Pulse: 86 78 78 82  Resp: 17 18 18 18   Temp: 98 F (36.7 C) 98.3 F (36.8 C) 98.5 F (36.9 C) 97.9 F (36.6 C)  TempSrc: Oral Oral Oral Oral  SpO2: 100% 98% 100% 98%  Weight:      Height:        CBC:  Recent Labs  Lab 06/03/18 1817  06/04/18 0418 06/05/18 0456  WBC 4.8  --  4.5 4.4  NEUTROABS 2.2  --   --   --   HGB 11.1*   < > 9.7* 10.6*  HCT 37.2   < > 32.1* 34.8*  MCV 75.5*  --  74.7* 74.4*  PLT 172  --  131* 161   < > = values in this interval not displayed.    Basic Metabolic Panel:  Recent Labs  Lab 06/04/18 0418 06/05/18 0456  NA 140 140  K 4.1 3.8  CL 111 110  CO2 25 23  GLUCOSE 113* 127*  BUN 7* 8  CREATININE 0.98 0.96  CALCIUM 10.3 11.0*   Lipid Panel:     Component Value Date/Time   CHOL 78 06/04/2018 0418   TRIG 91 06/04/2018 0418   HDL 28 (L) 06/04/2018 0418   CHOLHDL 2.8 06/04/2018 0418   VLDL 18 06/04/2018 0418   LDLCALC 32 06/04/2018 0418   HgbA1c:  Lab Results  Component Value Date   HGBA1C 6.7 (H) 06/04/2018   Urine Drug Screen:     Component Value Date/Time   LABOPIA NONE DETECTED 06/03/2018 1900   COCAINSCRNUR NONE DETECTED 06/03/2018 1900   LABBENZ NONE DETECTED 06/03/2018 1900   AMPHETMU NONE DETECTED 06/03/2018 1900   THCU NONE DETECTED 06/03/2018 1900   LABBARB NONE DETECTED 06/03/2018 1900    Alcohol Level     Component Value Date/Time   ETH <10 06/03/2018 1817    IMAGING Mr [name] Neck W Wo Contrast  Result Date: 06/04/2018 CLINICAL DATA:  Altered mental status. Assess TIA. History of breast and lung cancer, RIGHT ICA aneurysm, hypertension, hyperlipidemia, diabetes. EXAM: MRI HEAD WITHOUT CONTRAST MRA  HEAD WITHOUT CONTRAST MRA NECK WITHOUT AND WITH CONTRAST TECHNIQUE: Multiplanar, multiecho pulse sequences of the brain and surrounding structures were obtained without intravenous contrast. Angiographic images of the Circle of Willis were obtained using MRA technique without intravenous contrast. Angiographic images of the neck were obtained using MRA technique without and with intravenous contrast. Carotid stenosis measurements (when applicable) are obtained utilizing NASCET criteria, using the distal internal carotid diameter as the denominator. CONTRAST:  42mL MULTIHANCE GADOBENATE DIMEGLUMINE 529 MG/ML IV SOLN COMPARISON:  None. FINDINGS: MRI HEAD FINDINGS INTRACRANIAL CONTENTS: No reduced diffusion to suggest acute ischemia. A few scattered chronic microhemorrhages present. The ventricles and sulci are normal for patient's age. Old RIGHT basal ganglia and thalami lacunar infarcts. Old small LEFT cerebellar infarcts. Patchy to confluent supratentorial white matter FLAIR T2 hyperintensities. No suspicious parenchymal signal, masses, mass effect. No abnormal extra-axial fluid collections. No extra-axial masses. VASCULAR: Normal major intracranial vascular flow voids present at skull base. SKULL AND UPPER CERVICAL SPINE: No abnormal sellar expansion. No suspicious calvarial bone marrow signal. Craniocervical junction maintained.  SINUSES/ORBITS: The mastoid air-cells and included paranasal sinuses are well-aerated.The included ocular globes and orbital contents are non-suspicious. Status post bilateral ocular lens implants. Patient is edentulous. MRA HEAD FINDINGS ANTERIOR CIRCULATION: Flow related enhancement of the included cervical, petrous, cavernous and supraclinoid internal carotid arteries. 3 mm L pouching LEFT cavernous ICA. Fusiform aneurysm RIGHT cavernous ICA measuring to 6 mm. Moderate RIGHT and severe stenosis LEFT paraophthalmic segments. 2 mm aneurysm RIGHT A1 2 junction. Patent anterior  communicating artery. Patent anterior and middle cerebral arteries. Multifocal bilateral MCA stenosis. No large vessel occlusion, flow limiting stenosis, aneurysm. POSTERIOR CIRCULATION: LEFT vertebral artery is dominant. Vertebrobasilar arteries are patent, with normal flow related enhancement of the main branch vessels. Moderate luminal irregularity LEFT vertebral artery and basilar artery compatible with atherosclerosis. Patent posterior cerebral arteries. Mild stenosis LEFT P2 segment. No large vessel occlusion, flow limiting stenosis. ANATOMIC VARIANTS: None. Source images and MIP images were reviewed. MRA NECK FINDINGS ANTERIOR CIRCULATION: Arch vessel origins are patent. Moderate stenosis bilateral subclavian arteries, LEFT axillary artery the common carotid arteries are widely patent bilaterally. The carotid bifurcations are patent bilaterally without hemodynamically significant stenosis by NASCET criteria. No flow limiting stenosis or luminal irregularity. POSTERIOR CIRCULATION: Bilateral vertebral arteries are patent to the vertebrobasilar junction. Severe stenosis LEFT V1 segment. Source images and MIP images were reviewed. IMPRESSION: MRI HEAD: 1. No acute intracranial process. 2. Moderate chronic small vessel ischemic disease. 3. Old small basal ganglia, thalami and LEFT cerebellar infarcts. MRA HEAD: 1. No emergent large vessel occlusion. 2. Severe LEFT and moderate RIGHT ICA stenosis, paraophthalmic segment. 2 mm RIGHT A1 2 junction aneurysm. 3. Multifocal moderate stenosis bilateral MCA, LEFT vertebral artery and basilar artery compatible with atherosclerosis. 4. 3 mm aneurysm LEFT cavernous ICA. 6 mm fusiform aneurysm RIGHT ICA. MRA NECK: 1. No hemodynamically significant stenosis internal carotid arteries. 2. Patent vertebral arteries, severe stenosis LEFT V1 segment. Electronically Signed   By: Elon Alas M.D.   On: 06/04/2018 20:44   Mr Brain Wo Contrast  Result Date:  06/04/2018 CLINICAL DATA:  Altered mental status. Assess TIA. History of breast and lung cancer, RIGHT ICA aneurysm, hypertension, hyperlipidemia, diabetes. EXAM: MRI HEAD WITHOUT CONTRAST MRA HEAD WITHOUT CONTRAST MRA NECK WITHOUT AND WITH CONTRAST TECHNIQUE: Multiplanar, multiecho pulse sequences of the brain and surrounding structures were obtained without intravenous contrast. Angiographic images of the Circle of Willis were obtained using MRA technique without intravenous contrast. Angiographic images of the neck were obtained using MRA technique without and with intravenous contrast. Carotid stenosis measurements (when applicable) are obtained utilizing NASCET criteria, using the distal internal carotid diameter as the denominator. CONTRAST:  44mL MULTIHANCE GADOBENATE DIMEGLUMINE 529 MG/ML IV SOLN COMPARISON:  None. FINDINGS: MRI HEAD FINDINGS INTRACRANIAL CONTENTS: No reduced diffusion to suggest acute ischemia. A few scattered chronic microhemorrhages present. The ventricles and sulci are normal for patient's age. Old RIGHT basal ganglia and thalami lacunar infarcts. Old small LEFT cerebellar infarcts. Patchy to confluent supratentorial white matter FLAIR T2 hyperintensities. No suspicious parenchymal signal, masses, mass effect. No abnormal extra-axial fluid collections. No extra-axial masses. VASCULAR: Normal major intracranial vascular flow voids present at skull base. SKULL AND UPPER CERVICAL SPINE: No abnormal sellar expansion. No suspicious calvarial bone marrow signal. Craniocervical junction maintained. SINUSES/ORBITS: The mastoid air-cells and included paranasal sinuses are well-aerated.The included ocular globes and orbital contents are non-suspicious. Status post bilateral ocular lens implants. Patient is edentulous. MRA HEAD FINDINGS ANTERIOR CIRCULATION: Flow related enhancement of the included cervical, petrous, cavernous and supraclinoid  internal carotid arteries. 3 mm L pouching LEFT  cavernous ICA. Fusiform aneurysm RIGHT cavernous ICA measuring to 6 mm. Moderate RIGHT and severe stenosis LEFT paraophthalmic segments. 2 mm aneurysm RIGHT A1 2 junction. Patent anterior communicating artery. Patent anterior and middle cerebral arteries. Multifocal bilateral MCA stenosis. No large vessel occlusion, flow limiting stenosis, aneurysm. POSTERIOR CIRCULATION: LEFT vertebral artery is dominant. Vertebrobasilar arteries are patent, with normal flow related enhancement of the main branch vessels. Moderate luminal irregularity LEFT vertebral artery and basilar artery compatible with atherosclerosis. Patent posterior cerebral arteries. Mild stenosis LEFT P2 segment. No large vessel occlusion, flow limiting stenosis. ANATOMIC VARIANTS: None. Source images and MIP images were reviewed. MRA NECK FINDINGS ANTERIOR CIRCULATION: Arch vessel origins are patent. Moderate stenosis bilateral subclavian arteries, LEFT axillary artery the common carotid arteries are widely patent bilaterally. The carotid bifurcations are patent bilaterally without hemodynamically significant stenosis by NASCET criteria. No flow limiting stenosis or luminal irregularity. POSTERIOR CIRCULATION: Bilateral vertebral arteries are patent to the vertebrobasilar junction. Severe stenosis LEFT V1 segment. Source images and MIP images were reviewed. IMPRESSION: MRI HEAD: 1. No acute intracranial process. 2. Moderate chronic small vessel ischemic disease. 3. Old small basal ganglia, thalami and LEFT cerebellar infarcts. MRA HEAD: 1. No emergent large vessel occlusion. 2. Severe LEFT and moderate RIGHT ICA stenosis, paraophthalmic segment. 2 mm RIGHT A1 2 junction aneurysm. 3. Multifocal moderate stenosis bilateral MCA, LEFT vertebral artery and basilar artery compatible with atherosclerosis. 4. 3 mm aneurysm LEFT cavernous ICA. 6 mm fusiform aneurysm RIGHT ICA. MRA NECK: 1. No hemodynamically significant stenosis internal carotid arteries. 2.  Patent vertebral arteries, severe stenosis LEFT V1 segment. Electronically Signed   By: Elon Alas M.D.   On: 06/04/2018 20:44   Mr [name] Head Wo Contrast  Result Date: 06/04/2018 CLINICAL DATA:  Altered mental status. Assess TIA. History of breast and lung cancer, RIGHT ICA aneurysm, hypertension, hyperlipidemia, diabetes. EXAM: MRI HEAD WITHOUT CONTRAST MRA HEAD WITHOUT CONTRAST MRA NECK WITHOUT AND WITH CONTRAST TECHNIQUE: Multiplanar, multiecho pulse sequences of the brain and surrounding structures were obtained without intravenous contrast. Angiographic images of the Circle of Willis were obtained using MRA technique without intravenous contrast. Angiographic images of the neck were obtained using MRA technique without and with intravenous contrast. Carotid stenosis measurements (when applicable) are obtained utilizing NASCET criteria, using the distal internal carotid diameter as the denominator. CONTRAST:  53mL MULTIHANCE GADOBENATE DIMEGLUMINE 529 MG/ML IV SOLN COMPARISON:  None. FINDINGS: MRI HEAD FINDINGS INTRACRANIAL CONTENTS: No reduced diffusion to suggest acute ischemia. A few scattered chronic microhemorrhages present. The ventricles and sulci are normal for patient's age. Old RIGHT basal ganglia and thalami lacunar infarcts. Old small LEFT cerebellar infarcts. Patchy to confluent supratentorial white matter FLAIR T2 hyperintensities. No suspicious parenchymal signal, masses, mass effect. No abnormal extra-axial fluid collections. No extra-axial masses. VASCULAR: Normal major intracranial vascular flow voids present at skull base. SKULL AND UPPER CERVICAL SPINE: No abnormal sellar expansion. No suspicious calvarial bone marrow signal. Craniocervical junction maintained. SINUSES/ORBITS: The mastoid air-cells and included paranasal sinuses are well-aerated.The included ocular globes and orbital contents are non-suspicious. Status post bilateral ocular lens implants. Patient is edentulous. MRA  HEAD FINDINGS ANTERIOR CIRCULATION: Flow related enhancement of the included cervical, petrous, cavernous and supraclinoid internal carotid arteries. 3 mm L pouching LEFT cavernous ICA. Fusiform aneurysm RIGHT cavernous ICA measuring to 6 mm. Moderate RIGHT and severe stenosis LEFT paraophthalmic segments. 2 mm aneurysm RIGHT A1 2 junction. Patent anterior communicating artery. Patent anterior and  middle cerebral arteries. Multifocal bilateral MCA stenosis. No large vessel occlusion, flow limiting stenosis, aneurysm. POSTERIOR CIRCULATION: LEFT vertebral artery is dominant. Vertebrobasilar arteries are patent, with normal flow related enhancement of the main branch vessels. Moderate luminal irregularity LEFT vertebral artery and basilar artery compatible with atherosclerosis. Patent posterior cerebral arteries. Mild stenosis LEFT P2 segment. No large vessel occlusion, flow limiting stenosis. ANATOMIC VARIANTS: None. Source images and MIP images were reviewed. MRA NECK FINDINGS ANTERIOR CIRCULATION: Arch vessel origins are patent. Moderate stenosis bilateral subclavian arteries, LEFT axillary artery the common carotid arteries are widely patent bilaterally. The carotid bifurcations are patent bilaterally without hemodynamically significant stenosis by NASCET criteria. No flow limiting stenosis or luminal irregularity. POSTERIOR CIRCULATION: Bilateral vertebral arteries are patent to the vertebrobasilar junction. Severe stenosis LEFT V1 segment. Source images and MIP images were reviewed. IMPRESSION: MRI HEAD: 1. No acute intracranial process. 2. Moderate chronic small vessel ischemic disease. 3. Old small basal ganglia, thalami and LEFT cerebellar infarcts. MRA HEAD: 1. No emergent large vessel occlusion. 2. Severe LEFT and moderate RIGHT ICA stenosis, paraophthalmic segment. 2 mm RIGHT A1 2 junction aneurysm. 3. Multifocal moderate stenosis bilateral MCA, LEFT vertebral artery and basilar artery compatible with  atherosclerosis. 4. 3 mm aneurysm LEFT cavernous ICA. 6 mm fusiform aneurysm RIGHT ICA. MRA NECK: 1. No hemodynamically significant stenosis internal carotid arteries. 2. Patent vertebral arteries, severe stenosis LEFT V1 segment. Electronically Signed   By: Elon Alas M.D.   On: 06/04/2018 20:44   Ct Head Code Stroke Wo Contrast  Result Date: 06/03/2018 CLINICAL DATA:  Code stroke. Altered mental status, facial droop, vomiting. History of RIGHT ICA aneurysm, breast cancer, colon cancer, hypertension, hyperlipidemia. EXAM: CT HEAD WITHOUT CONTRAST TECHNIQUE: Contiguous axial images were obtained from the base of the skull through the vertex without intravenous contrast. COMPARISON:  CT HEAD November 19, 2017 and MRI head November 25, 2015. FINDINGS: BRAIN: No intraparenchymal hemorrhage, mass effect nor midline shift. The ventricles and sulci are normal for age. Patchy supratentorial white matter hypodensities within normal range for patient's age, though non-specific are most compatible with chronic small vessel ischemic disease. Old RIGHT basal ganglia lacunar infarcts. Focal vermian atrophy. Old small LEFT cerebellar infarct. No acute large vascular territory infarcts. No abnormal extra-axial fluid collections. Basal cisterns are patent. VASCULAR: Severe calcific atherosclerosis of the carotid siphons. SKULL: No skull fracture. No significant scalp soft tissue swelling. SINUSES/ORBITS: Frothy secretions bilateral sphenoid sinuses.The included ocular globes and orbital contents are non-suspicious. Status post bilateral ocular lens implants. OTHER: None. ASPECTS [facility] Stroke Program Early CT Score) - Ganglionic level infarction (caudate, lentiform nuclei, internal capsule, insula, M1-M3 cortex): 7 - Supraganglionic infarction (M4-M6 cortex): 3 Total score (0-10 with 10 being normal): 10 IMPRESSION: 1. No acute intracranial process. 2. ASPECTS is 10. 3. Stable examination including moderate chronic small  vessel ischemic changes and severe atherosclerosis. 4. Critical Value/emergent results were called by telephone at the time of interpretation on 06/03/2018 at 6:47 pm to Dr. Dene Gentry , who verbally acknowledged these results. Electronically Signed   By: Elon Alas M.D.   On: 06/03/2018 18:48    PHYSICAL EXAM Frail elderly african american lady not in distress. . Afebrile. Head is nontraumatic. Neck is supple without bruit.    Cardiac exam no murmur or gallop. Lungs are clear to auscultation. Distal pulses are well felt. Neurological Exam ;  Awake  Alert oriented x 2.  Diminished attention, registration and recall.  Poor insight into the illness.  Normal  speech and language.eye movements full without nystagmus.fundi were not visualized. Vision acuity and fields appear normal. Hearing is normal. Palatal movements are normal. Face symmetric. Tongue midline. Normal strength, tone, reflexes and coordination.  Slightly diminished fine finger movements on the left and orbits right over left upper extremity.  Normal sensation. Gait deferred.  ASSESSMENT/PLAN [name] is a 84 y.o. female with history of compensated systolic heart failure, CAD, DB, HLD, HTN, colon cancer and breast cancer presenting with slurred speech and L HP.   Right brain subcortical stroke vs TIA:  Workup underway  Code Stroke CT head No acute stroke. Small vessel disease.  Severe atherosclerosis.ASPECTS 10.     MRI  pending   MRA head and neck pending   2D Echo  pending   LDL 32  HgbA1c 6.7  Lovenox 40 mg sq daily for VTE prophylaxis  aspirin 81 mg daily prior to admission, now on aspirin 325 mg daily and clopidogrel 75 mg daily.into 3 weeks followed by aspirin alone    Therapy recommendations: 24-hour supervision, no therapy needs (bedridden, needs assist to get up)  Disposition: Return home  (lives w/ dtr)  Hypertension  Stable . BP goal normotensive  Hyperlipidemia  Home meds:  crestor 20,  resumed in hospital  LDL 32, goal < 70  Continue statin at discharge  Diabetes type II  HgbA1c 6.7, goal < 7.0  Controlled  Other Stroke Risk Factors  Advanced age  Hx stroke/TIA  Hx TIAs "back in the day" per pt  Coronary artery disease s/p MI w stent in 2008  HFpEF, chronic  Compensated systolic congestive heart failure  Other Active Problems  Bacteruria  Anemia, acute  GERD  History of colon and breast cancer  Hospital day # 0     . She presented with transient change in mental status and left facial droop possibly from right brain TIA or small stroke.  Recommend dual antiplatelet therapy x 3 weeks followed by aspirin aloneand continue ongoing stroke work-up.  No family available at the bedside for discussion.   Stroke team will sign off. Follow-up as an outpatient in stroke clinic in 6 weeks. Antony Contras, MD Medical Director [place] Pager: [phone] 06/05/2018 2:03 PM  To contact Stroke Continuity provider, please refer to [email or web address] After hours, contact General Neurology

## 2018-06-05 NOTE — Plan of Care (Signed)
Adequate for discharge.

## 2018-06-05 NOTE — Discharge Summary (Signed)
[name] Discharge Summary  [name] Medical record number: [id number] Date of birth: 04-Sep-1924 Age: 84 y.o. Gender: female Date of Admission: 06/03/2018  Date of Discharge: 06/05/2018 Admitting Physician: [name] Shirley, DO  Primary Care Provider: Grant Bing, DO Consultants: Neuro  Indication for Hospitalization: TIA  Discharge Diagnoses/Problem List:  TIA Bacteruria Anemia HFpEF HTN T2DM HLD GERD  Disposition: Home  Discharge Condition: Stable  Discharge Exam:  General: 84 y.o. y.o. female in NAD Eyes: PERRL Cardio: RRR no m/r/g Lungs: CTAB, no wheezing, no rhonchi, no crackles Abdomen: Soft, non-tender to palpation, positive bowel sounds Skin: warm and dry Extremities: No edema, 5/5 strength Neuro: CN II-XII grossly intact, A&Ox2, to person and place only    Brief [name] Course:  [name] a 84 year old female presented to the ED on 7/22, with new onset left-sided facial droop and altered mental status per her family.    TIA Allergy was consulted in the ED, but symptoms had resolved by the time of evaluation.  CT head was ordered and showed no acute intracranial bleeding.  Throughout the rest of her stay her neurologic exam continued to remain normal.  Neurology recommended TTE, MRI of head, MRA of head and neck.  Echo showed EF of 60 to 65% with grade 1 diastolic dysfunction and moderate aortic regurg.  MRI of the brain showed moderate chronic small vessel ischemic disease, no acute process, old small basal ganglia, thalami, and left cerebellar infarcts.  MRI of head and neck showed severe left and moderate right ICA stenosis in the paraophthalmic segment, 2 mm right A1 2 junction aneurysm, moderate stenosis of bilateral MCA, 3 mm aneurysm left cavernous ICA, 6 mm fusiform aneurysm right ICA.  Lipid panel is within normal limits aside from a HDL of 28.  She was started on dual antiplatelet therapy with  aspirin 325 mg daily and Plavix 75 mg daily.  Neurology recommended continuing DAPT for 3 weeks, then continuing aspirin 325 mg daily.  Also recommended continuing home Crestor 20 mg daily.  She is to follow-up in 6 weeks at the stroke clinic.  Upon discharge patient was doing well and neurologic exam was grossly intact.  She was A&O x2, stable from admission.  Her confusion will likely continue to improve Lasix at home.  Bacteruria A UA was obtained in the ED that was positive for nitrites and leukocytes.  Her urine culture grew greater than 100,000 colonies of E. coli.  She received 1 g Rocephin in the ED.  She continued to remain asymptomatic.  Decision was made to not treat for this reason.  Anemia Patient's lowest hemoglobin during admission was 9.7 with a low MCV.Marland Kitchen  Her baseline was found to be around 10 per chart review.  An anemia panel was ordered that was within normal limits.  She had reported not taking iron as outpatient.  It is believed that this anemia secondary to a mild thalassemia.  The decision was made to not provide any supplementation as it was not necessary.  Will need follow-up CBC and work-up for thalassemia.  She remained hemodynamically stable throughout the admission.  Issues for Follow Up:  1. Patient on DAPT, aspirin 325mg  and Plavix x 3 weeks, then continue aspirin 325 only, per neuro. 2. Will follow up with stroke clinic in 6 weeks. 3. Follow up urinary symptoms, as patient had a positive culture. 4. Restarted on Coreg prior to discharge.  Will need Norvasc and Losartan added back  as BP tolerates. 5. Anemia likely secondary to mild thalessemia.  Iron supplementation not continued at discharge. Repeat CBC. 6. Follow up on confusion, patient was A&Ox2 during hospitalization, baseline A&Ox3.  Significant Procedures: Echo  Significant Labs and Imaging:  Recent Labs  Lab 06/03/18 1817 06/03/18 1824 06/04/18 0418 06/05/18 0456  WBC 4.8  --  4.5 4.4  HGB 11.1* 12.6  9.7* 10.6*  HCT 37.2 37.0 32.1* 34.8*  PLT 172  --  131* 161   Recent Labs  Lab 06/03/18 1817 06/03/18 1824 06/04/18 0418 06/05/18 0456  NA 140 142 140 140  K 3.4* 3.3* 4.1 3.8  CL 106 106 111 110  CO2 24  --  25 23  GLUCOSE 158* 155* 113* 127*  BUN 9 11 7* 8  CREATININE 0.92 0.90 0.98 0.96  CALCIUM 11.2*  --  10.3 11.0*  ALKPHOS 59  --   --   --   AST 16  --   --   --   ALT 10  --   --   --   ALBUMIN 3.4*  --   --   --    Lipid Panel  HDL 28 LDL 32 TG91  Anemia Panel Iron 50/TIBC276/Ferritin23/Folate 6.4/B12 381  Echo 7/23 Study Conclusions  - Left ventricle: The cavity size was normal. There was moderate   focal basal hypertrophy of the septum. Systolic function was   normal. The estimated ejection fraction was in the range of 60%   to 65%. There was dynamic obstruction at rest, with a peak   velocity of 143 cm/sec and a peak gradient of 8 mm Hg. Wall   motion was normal; there were no regional wall motion   abnormalities. Doppler parameters are consistent with abnormal   left ventricular relaxation (grade 1 diastolic dysfunction).   Doppler parameters are consistent with high ventricular filling   pressure. - Aortic valve: Transvalvular velocity was within the normal range.   There was no stenosis. There was moderate regurgitation. Valve   area (VTI): 2.75 cm^2. Valve area (Vmean): 2.43 cm^2. - Mitral valve: Mildly calcified annulus. Mildly thickened leaflets   . Transvalvular velocity was within the normal range. There was   no evidence for stenosis. There was no regurgitation. - Right ventricle: The cavity size was normal. Wall thickness was   normal. Systolic function was normal. - Tricuspid valve: There was no regurgitation.  Mr [name] Neck W Wo Contrast  Result Date: 06/04/2018 CLINICAL DATA:  Altered mental status. Assess TIA. History of breast and lung cancer, RIGHT ICA aneurysm, hypertension, hyperlipidemia, diabetes. EXAM: MRI HEAD WITHOUT CONTRAST  MRA HEAD WITHOUT CONTRAST MRA NECK WITHOUT AND WITH CONTRAST TECHNIQUE: Multiplanar, multiecho pulse sequences of the brain and surrounding structures were obtained without intravenous contrast. Angiographic images of the Circle of Willis were obtained using MRA technique without intravenous contrast. Angiographic images of the neck were obtained using MRA technique without and with intravenous contrast. Carotid stenosis measurements (when applicable) are obtained utilizing NASCET criteria, using the distal internal carotid diameter as the denominator. CONTRAST:  78mL MULTIHANCE GADOBENATE DIMEGLUMINE 529 MG/ML IV SOLN COMPARISON:  None. FINDINGS: MRI HEAD FINDINGS INTRACRANIAL CONTENTS: No reduced diffusion to suggest acute ischemia. A few scattered chronic microhemorrhages present. The ventricles and sulci are normal for patient's age. Old RIGHT basal ganglia and thalami lacunar infarcts. Old small LEFT cerebellar infarcts. Patchy to confluent supratentorial white matter FLAIR T2 hyperintensities. No suspicious parenchymal signal, masses, mass effect. No abnormal extra-axial fluid collections. No  extra-axial masses. VASCULAR: Normal major intracranial vascular flow voids present at skull base. SKULL AND UPPER CERVICAL SPINE: No abnormal sellar expansion. No suspicious calvarial bone marrow signal. Craniocervical junction maintained. SINUSES/ORBITS: The mastoid air-cells and included paranasal sinuses are well-aerated.The included ocular globes and orbital contents are non-suspicious. Status post bilateral ocular lens implants. Patient is edentulous. MRA HEAD FINDINGS ANTERIOR CIRCULATION: Flow related enhancement of the included cervical, petrous, cavernous and supraclinoid internal carotid arteries. 3 mm L pouching LEFT cavernous ICA. Fusiform aneurysm RIGHT cavernous ICA measuring to 6 mm. Moderate RIGHT and severe stenosis LEFT paraophthalmic segments. 2 mm aneurysm RIGHT A1 2 junction. Patent anterior  communicating artery. Patent anterior and middle cerebral arteries. Multifocal bilateral MCA stenosis. No large vessel occlusion, flow limiting stenosis, aneurysm. POSTERIOR CIRCULATION: LEFT vertebral artery is dominant. Vertebrobasilar arteries are patent, with normal flow related enhancement of the main branch vessels. Moderate luminal irregularity LEFT vertebral artery and basilar artery compatible with atherosclerosis. Patent posterior cerebral arteries. Mild stenosis LEFT P2 segment. No large vessel occlusion, flow limiting stenosis. ANATOMIC VARIANTS: None. Source images and MIP images were reviewed. MRA NECK FINDINGS ANTERIOR CIRCULATION: Arch vessel origins are patent. Moderate stenosis bilateral subclavian arteries, LEFT axillary artery the common carotid arteries are widely patent bilaterally. The carotid bifurcations are patent bilaterally without hemodynamically significant stenosis by NASCET criteria. No flow limiting stenosis or luminal irregularity. POSTERIOR CIRCULATION: Bilateral vertebral arteries are patent to the vertebrobasilar junction. Severe stenosis LEFT V1 segment. Source images and MIP images were reviewed. IMPRESSION: MRI HEAD: 1. No acute intracranial process. 2. Moderate chronic small vessel ischemic disease. 3. Old small basal ganglia, thalami and LEFT cerebellar infarcts. MRA HEAD: 1. No emergent large vessel occlusion. 2. Severe LEFT and moderate RIGHT ICA stenosis, paraophthalmic segment. 2 mm RIGHT A1 2 junction aneurysm. 3. Multifocal moderate stenosis bilateral MCA, LEFT vertebral artery and basilar artery compatible with atherosclerosis. 4. 3 mm aneurysm LEFT cavernous ICA. 6 mm fusiform aneurysm RIGHT ICA. MRA NECK: 1. No hemodynamically significant stenosis internal carotid arteries. 2. Patent vertebral arteries, severe stenosis LEFT V1 segment. Electronically Signed   By: Elon Alas M.D.   On: 06/04/2018 20:44   Mr Brain Wo Contrast  Result Date:  06/04/2018 CLINICAL DATA:  Altered mental status. Assess TIA. History of breast and lung cancer, RIGHT ICA aneurysm, hypertension, hyperlipidemia, diabetes. EXAM: MRI HEAD WITHOUT CONTRAST MRA HEAD WITHOUT CONTRAST MRA NECK WITHOUT AND WITH CONTRAST TECHNIQUE: Multiplanar, multiecho pulse sequences of the brain and surrounding structures were obtained without intravenous contrast. Angiographic images of the Circle of Willis were obtained using MRA technique without intravenous contrast. Angiographic images of the neck were obtained using MRA technique without and with intravenous contrast. Carotid stenosis measurements (when applicable) are obtained utilizing NASCET criteria, using the distal internal carotid diameter as the denominator. CONTRAST:  22mL MULTIHANCE GADOBENATE DIMEGLUMINE 529 MG/ML IV SOLN COMPARISON:  None. FINDINGS: MRI HEAD FINDINGS INTRACRANIAL CONTENTS: No reduced diffusion to suggest acute ischemia. A few scattered chronic microhemorrhages present. The ventricles and sulci are normal for patient's age. Old RIGHT basal ganglia and thalami lacunar infarcts. Old small LEFT cerebellar infarcts. Patchy to confluent supratentorial white matter FLAIR T2 hyperintensities. No suspicious parenchymal signal, masses, mass effect. No abnormal extra-axial fluid collections. No extra-axial masses. VASCULAR: Normal major intracranial vascular flow voids present at skull base. SKULL AND UPPER CERVICAL SPINE: No abnormal sellar expansion. No suspicious calvarial bone marrow signal. Craniocervical junction maintained. SINUSES/ORBITS: The mastoid air-cells and included paranasal sinuses are well-aerated.The included ocular  globes and orbital contents are non-suspicious. Status post bilateral ocular lens implants. Patient is edentulous. MRA HEAD FINDINGS ANTERIOR CIRCULATION: Flow related enhancement of the included cervical, petrous, cavernous and supraclinoid internal carotid arteries. 3 mm L pouching LEFT  cavernous ICA. Fusiform aneurysm RIGHT cavernous ICA measuring to 6 mm. Moderate RIGHT and severe stenosis LEFT paraophthalmic segments. 2 mm aneurysm RIGHT A1 2 junction. Patent anterior communicating artery. Patent anterior and middle cerebral arteries. Multifocal bilateral MCA stenosis. No large vessel occlusion, flow limiting stenosis, aneurysm. POSTERIOR CIRCULATION: LEFT vertebral artery is dominant. Vertebrobasilar arteries are patent, with normal flow related enhancement of the main branch vessels. Moderate luminal irregularity LEFT vertebral artery and basilar artery compatible with atherosclerosis. Patent posterior cerebral arteries. Mild stenosis LEFT P2 segment. No large vessel occlusion, flow limiting stenosis. ANATOMIC VARIANTS: None. Source images and MIP images were reviewed. MRA NECK FINDINGS ANTERIOR CIRCULATION: Arch vessel origins are patent. Moderate stenosis bilateral subclavian arteries, LEFT axillary artery the common carotid arteries are widely patent bilaterally. The carotid bifurcations are patent bilaterally without hemodynamically significant stenosis by NASCET criteria. No flow limiting stenosis or luminal irregularity. POSTERIOR CIRCULATION: Bilateral vertebral arteries are patent to the vertebrobasilar junction. Severe stenosis LEFT V1 segment. Source images and MIP images were reviewed. IMPRESSION: MRI HEAD: 1. No acute intracranial process. 2. Moderate chronic small vessel ischemic disease. 3. Old small basal ganglia, thalami and LEFT cerebellar infarcts. MRA HEAD: 1. No emergent large vessel occlusion. 2. Severe LEFT and moderate RIGHT ICA stenosis, paraophthalmic segment. 2 mm RIGHT A1 2 junction aneurysm. 3. Multifocal moderate stenosis bilateral MCA, LEFT vertebral artery and basilar artery compatible with atherosclerosis. 4. 3 mm aneurysm LEFT cavernous ICA. 6 mm fusiform aneurysm RIGHT ICA. MRA NECK: 1. No hemodynamically significant stenosis internal carotid arteries. 2.  Patent vertebral arteries, severe stenosis LEFT V1 segment. Electronically Signed   By: Elon Alas M.D.   On: 06/04/2018 20:44   Mr [name] Head Wo Contrast  Result Date: 06/04/2018 CLINICAL DATA:  Altered mental status. Assess TIA. History of breast and lung cancer, RIGHT ICA aneurysm, hypertension, hyperlipidemia, diabetes. EXAM: MRI HEAD WITHOUT CONTRAST MRA HEAD WITHOUT CONTRAST MRA NECK WITHOUT AND WITH CONTRAST TECHNIQUE: Multiplanar, multiecho pulse sequences of the brain and surrounding structures were obtained without intravenous contrast. Angiographic images of the Circle of Willis were obtained using MRA technique without intravenous contrast. Angiographic images of the neck were obtained using MRA technique without and with intravenous contrast. Carotid stenosis measurements (when applicable) are obtained utilizing NASCET criteria, using the distal internal carotid diameter as the denominator. CONTRAST:  28mL MULTIHANCE GADOBENATE DIMEGLUMINE 529 MG/ML IV SOLN COMPARISON:  None. FINDINGS: MRI HEAD FINDINGS INTRACRANIAL CONTENTS: No reduced diffusion to suggest acute ischemia. A few scattered chronic microhemorrhages present. The ventricles and sulci are normal for patient's age. Old RIGHT basal ganglia and thalami lacunar infarcts. Old small LEFT cerebellar infarcts. Patchy to confluent supratentorial white matter FLAIR T2 hyperintensities. No suspicious parenchymal signal, masses, mass effect. No abnormal extra-axial fluid collections. No extra-axial masses. VASCULAR: Normal major intracranial vascular flow voids present at skull base. SKULL AND UPPER CERVICAL SPINE: No abnormal sellar expansion. No suspicious calvarial bone marrow signal. Craniocervical junction maintained. SINUSES/ORBITS: The mastoid air-cells and included paranasal sinuses are well-aerated.The included ocular globes and orbital contents are non-suspicious. Status post bilateral ocular lens implants. Patient is edentulous. MRA  HEAD FINDINGS ANTERIOR CIRCULATION: Flow related enhancement of the included cervical, petrous, cavernous and supraclinoid internal carotid arteries. 3 mm L pouching LEFT cavernous ICA.  Fusiform aneurysm RIGHT cavernous ICA measuring to 6 mm. Moderate RIGHT and severe stenosis LEFT paraophthalmic segments. 2 mm aneurysm RIGHT A1 2 junction. Patent anterior communicating artery. Patent anterior and middle cerebral arteries. Multifocal bilateral MCA stenosis. No large vessel occlusion, flow limiting stenosis, aneurysm. POSTERIOR CIRCULATION: LEFT vertebral artery is dominant. Vertebrobasilar arteries are patent, with normal flow related enhancement of the main branch vessels. Moderate luminal irregularity LEFT vertebral artery and basilar artery compatible with atherosclerosis. Patent posterior cerebral arteries. Mild stenosis LEFT P2 segment. No large vessel occlusion, flow limiting stenosis. ANATOMIC VARIANTS: None. Source images and MIP images were reviewed. MRA NECK FINDINGS ANTERIOR CIRCULATION: Arch vessel origins are patent. Moderate stenosis bilateral subclavian arteries, LEFT axillary artery the common carotid arteries are widely patent bilaterally. The carotid bifurcations are patent bilaterally without hemodynamically significant stenosis by NASCET criteria. No flow limiting stenosis or luminal irregularity. POSTERIOR CIRCULATION: Bilateral vertebral arteries are patent to the vertebrobasilar junction. Severe stenosis LEFT V1 segment. Source images and MIP images were reviewed. IMPRESSION: MRI HEAD: 1. No acute intracranial process. 2. Moderate chronic small vessel ischemic disease. 3. Old small basal ganglia, thalami and LEFT cerebellar infarcts. MRA HEAD: 1. No emergent large vessel occlusion. 2. Severe LEFT and moderate RIGHT ICA stenosis, paraophthalmic segment. 2 mm RIGHT A1 2 junction aneurysm. 3. Multifocal moderate stenosis bilateral MCA, LEFT vertebral artery and basilar artery compatible with  atherosclerosis. 4. 3 mm aneurysm LEFT cavernous ICA. 6 mm fusiform aneurysm RIGHT ICA. MRA NECK: 1. No hemodynamically significant stenosis internal carotid arteries. 2. Patent vertebral arteries, severe stenosis LEFT V1 segment. Electronically Signed   By: Elon Alas M.D.   On: 06/04/2018 20:44   Ct Head Code Stroke Wo Contrast  Result Date: 06/03/2018 CLINICAL DATA:  Code stroke. Altered mental status, facial droop, vomiting. History of RIGHT ICA aneurysm, breast cancer, colon cancer, hypertension, hyperlipidemia. EXAM: CT HEAD WITHOUT CONTRAST TECHNIQUE: Contiguous axial images were obtained from the base of the skull through the vertex without intravenous contrast. COMPARISON:  CT HEAD November 19, 2017 and MRI head November 25, 2015. FINDINGS: BRAIN: No intraparenchymal hemorrhage, mass effect nor midline shift. The ventricles and sulci are normal for age. Patchy supratentorial white matter hypodensities within normal range for patient's age, though non-specific are most compatible with chronic small vessel ischemic disease. Old RIGHT basal ganglia lacunar infarcts. Focal vermian atrophy. Old small LEFT cerebellar infarct. No acute large vascular territory infarcts. No abnormal extra-axial fluid collections. Basal cisterns are patent. VASCULAR: Severe calcific atherosclerosis of the carotid siphons. SKULL: No skull fracture. No significant scalp soft tissue swelling. SINUSES/ORBITS: Frothy secretions bilateral sphenoid sinuses.The included ocular globes and orbital contents are non-suspicious. Status post bilateral ocular lens implants. OTHER: None. ASPECTS [facility] Stroke Program Early CT Score) - Ganglionic level infarction (caudate, lentiform nuclei, internal capsule, insula, M1-M3 cortex): 7 - Supraganglionic infarction (M4-M6 cortex): 3 Total score (0-10 with 10 being normal): 10 IMPRESSION: 1. No acute intracranial process. 2. ASPECTS is 10. 3. Stable examination including moderate chronic small  vessel ischemic changes and severe atherosclerosis. 4. Critical Value/emergent results were called by telephone at the time of interpretation on 06/03/2018 at 6:47 pm to Dr. Dene Gentry , who verbally acknowledged these results. Electronically Signed   By: Elon Alas M.D.   On: 06/03/2018 18:48        Results/Tests Pending at Time of Discharge: None  Discharge Medications:  Allergies as of 06/05/2018      Reactions   Tape Other (See Comments)  Skin is very thin and will tear and bruise easily!!   Lisinopril Cough      Medication List    STOP taking these medications   amLODipine 5 MG tablet Commonly known as:  NORVASC   ASPIRIN CHILDRENS 81 MG chewable tablet Generic drug:  aspirin Replaced by:  aspirin 325 MG tablet   ferrous sulfate 325 (65 FE) MG tablet   losartan 50 MG tablet Commonly known as:  COZAAR     TAKE these medications   acetaminophen 500 MG tablet Commonly known as:  TYLENOL Take 500 mg by mouth every 6 (six) hours as needed for moderate pain.   albuterol 108 (90 Base) MCG/ACT inhaler Commonly known as:  PROVENTIL HFA;VENTOLIN HFA Inhale 2 puffs into the lungs every 6 (six) hours as needed. For shortness of breath What changed:    reasons to take this  additional instructions   aspirin 325 MG tablet Take 1 tablet (325 mg total) by mouth daily. Start taking on:  06/06/2018 Replaces:  ASPIRIN CHILDRENS 81 MG chewable tablet   capsaicin 0.025 % cream Commonly known as:  ZOSTRIX APPLY TO AFFECTED AREA TWICE A DAY   carvedilol 12.5 MG tablet Commonly known as:  COREG TAKE 1 TABLET BY MOUTH TWICE A DAY WITH A MEAL What changed:  See the new instructions.   clopidogrel 75 MG tablet Commonly known as:  PLAVIX Take 1 tablet (75 mg total) by mouth daily for 21 days. Then stop. Start taking on:  06/06/2018   feeding supplement (PRO-STAT SUGAR FREE 64) Liqd Take 30 mLs by mouth 2 (two) times daily with a meal. ensure   guaifenesin 100  MG/5ML syrup Commonly known as:  ROBITUSSIN Take 10 mLs (200 mg total) by mouth 3 (three) times daily as needed for cough.   hydroxypropyl methylcellulose / hypromellose 2.5 % ophthalmic solution Commonly known as:  ISOPTO TEARS / GONIOVISC Place 1 drop 3 (three) times daily into both eyes.   loratadine 10 MG tablet Commonly known as:  CLARITIN TAKE 1/2 TABLET BY MOUTH DAILY What changed:    how much to take  how to take this  when to take this   metFORMIN 500 MG tablet Commonly known as:  GLUCOPHAGE Take 1 tablet (500 mg total) by mouth daily with breakfast.   pantoprazole 40 MG tablet Commonly known as:  PROTONIX Take 1 tablet (40 mg total) by mouth daily.   PATADAY 0.2 % Soln Generic drug:  Olopatadine HCl APPLY 1 DROP TO EYE DAILY.   petrolatum-hydrophilic-aloe vera ointment Apply topically 2 (two) times daily as needed for wound care.   ranitidine 150 MG tablet Commonly known as:  ZANTAC TAKE 1 TABLET BY MOUTH TWICE A DAY What changed:    how much to take  how to take this  when to take this   rosuvastatin 20 MG tablet Commonly known as:  CRESTOR TAKE 1 TABLET BY MOUTH EVERYDAY AT BEDTIME   senna-docusate 8.6-50 MG tablet Commonly known as:  Senokot-S Take 2 tablets by mouth at bedtime. What changed:    how much to take  when to take this   silver sulfADIAZINE 1 % cream Commonly known as:  SILVADENE Apply 1 application topically daily.   traMADol 50 MG tablet Commonly known as:  ULTRAM Take 1 tablet (50 mg total) by mouth every 8 (eight) hours as needed for severe pain. What changed:  how much to take       Discharge Instructions: Please refer to Patient  Instructions section of EMR for full details.  Patient was counseled important signs and symptoms that should prompt return to medical care, changes in medications, dietary instructions, activity restrictions, and follow up appointments.   Follow-Up Appointments: Follow-up Information     Garvin Fila, MD. Schedule an appointment as soon as possible for a visit in 6 week(s).   Specialties:  Neurology, Radiology Contact information: [address] [place] [address] [place], [place], DO. Go on 06/12/2018.   Specialty:  Family Medicine Why:  10:10 AM Contact information: [place] [address] [phone]           Gregery Na, DO 06/05/2018, 5:54 PM PGY-1, [place]

## 2018-06-05 NOTE — Care Management Obs Status (Signed)
[facility] NOTIFICATION   Patient Details  Name: [name] MRN: [id number] Date of Birth: May 20, 1924   Medicare Observation Status Notification Given:  Yes    [name], RN 06/05/2018, 11:45 AM

## 2018-06-05 NOTE — Care Management Note (Signed)
Case Management Note  Patient Details  Name: [name] MRN: [id number] Date of Birth: [birth year]  Subjective/Objective:     Pt in with TIA. She is from home with her daughter and is wheelchair bound at baseline. Per daughter, [name], they have wheelchair, walker, 3 in1. They have aide services 9-11 am Monday through Friday.  Pt has been active with [facility] for [facility] RN and CSW. Pt is set up with [facility] and [facility].                Action/Plan: Pt discharging home with continuation of previous [facility] services. CM called [name] with [facility] and informed her of admission and of d/c back to home today.  CM called patients daughter and left a message on her voice mail regarding the discharge. Daughter stated earlier that the family would provide transportation home.   Expected Discharge Date:  06/05/18               Expected Discharge Plan:  [place]  In-House Referral:     Discharge planning Services  CM Consult  Post Acute Care Choice:  Home Health, Resumption of Svcs/PTA Provider Choice offered to:     DME Arranged:    DME Agency:     HH Arranged:  RN, Social Work [employer] Agency:  [place]  Status of Service:  Completed, signed off  If discussed at [employer], dates discussed:    Additional Comments:  [name], RN 06/05/2018, 3:08 PM

## 2018-06-06 ENCOUNTER — Telehealth: Payer: Self-pay

## 2018-06-06 ENCOUNTER — Inpatient Hospital Stay (HOSPITAL_COMMUNITY)
Admission: EM | Admit: 2018-06-06 | Discharge: 2018-06-09 | DRG: 690 | Disposition: A | Discharge status: Home / Self Care | Payer: Medicare Other | Attending: Family Medicine | Admitting: Family Medicine

## 2018-06-06 ENCOUNTER — Observation Stay (HOSPITAL_COMMUNITY): Payer: Medicare Other

## 2018-06-06 DIAGNOSIS — N390 Urinary tract infection, site not specified: Principal | ICD-10-CM | POA: Diagnosis present

## 2018-06-06 DIAGNOSIS — D509 Iron deficiency anemia, unspecified: Secondary | ICD-10-CM | POA: Diagnosis present

## 2018-06-06 DIAGNOSIS — Z803 Family history of malignant neoplasm of breast: Secondary | ICD-10-CM

## 2018-06-06 DIAGNOSIS — I110 Hypertensive heart disease with heart failure: Secondary | ICD-10-CM | POA: Diagnosis present

## 2018-06-06 DIAGNOSIS — Z8673 Personal history of transient ischemic attack (TIA), and cerebral infarction without residual deficits: Secondary | ICD-10-CM

## 2018-06-06 DIAGNOSIS — Z8249 Family history of ischemic heart disease and other diseases of the circulatory system: Secondary | ICD-10-CM

## 2018-06-06 DIAGNOSIS — F0390 Unspecified dementia without behavioral disturbance: Secondary | ICD-10-CM

## 2018-06-06 DIAGNOSIS — Z96651 Presence of right artificial knee joint: Secondary | ICD-10-CM | POA: Diagnosis present

## 2018-06-06 DIAGNOSIS — R509 Fever, unspecified: Secondary | ICD-10-CM | POA: Diagnosis not present

## 2018-06-06 DIAGNOSIS — Z79899 Other long term (current) drug therapy: Secondary | ICD-10-CM

## 2018-06-06 DIAGNOSIS — Z66 Do not resuscitate: Secondary | ICD-10-CM | POA: Diagnosis present

## 2018-06-06 DIAGNOSIS — R4182 Altered mental status, unspecified: Secondary | ICD-10-CM | POA: Diagnosis present

## 2018-06-06 DIAGNOSIS — I10 Essential (primary) hypertension: Secondary | ICD-10-CM | POA: Diagnosis not present

## 2018-06-06 DIAGNOSIS — G459 Transient cerebral ischemic attack, unspecified: Secondary | ICD-10-CM | POA: Diagnosis not present

## 2018-06-06 DIAGNOSIS — Z86711 Personal history of pulmonary embolism: Secondary | ICD-10-CM

## 2018-06-06 DIAGNOSIS — M1990 Unspecified osteoarthritis, unspecified site: Secondary | ICD-10-CM | POA: Diagnosis present

## 2018-06-06 DIAGNOSIS — Z86718 Personal history of other venous thrombosis and embolism: Secondary | ICD-10-CM

## 2018-06-06 DIAGNOSIS — Z9013 Acquired absence of bilateral breasts and nipples: Secondary | ICD-10-CM

## 2018-06-06 DIAGNOSIS — Z7982 Long term (current) use of aspirin: Secondary | ICD-10-CM

## 2018-06-06 DIAGNOSIS — Z86010 Personal history of colonic polyps: Secondary | ICD-10-CM

## 2018-06-06 DIAGNOSIS — K210 Gastro-esophageal reflux disease with esophagitis: Secondary | ICD-10-CM

## 2018-06-06 DIAGNOSIS — Z91048 Other nonmedicinal substance allergy status: Secondary | ICD-10-CM

## 2018-06-06 DIAGNOSIS — K219 Gastro-esophageal reflux disease without esophagitis: Secondary | ICD-10-CM | POA: Diagnosis present

## 2018-06-06 DIAGNOSIS — I5032 Chronic diastolic (congestive) heart failure: Secondary | ICD-10-CM | POA: Diagnosis present

## 2018-06-06 DIAGNOSIS — Z7902 Long term (current) use of antithrombotics/antiplatelets: Secondary | ICD-10-CM

## 2018-06-06 DIAGNOSIS — R1310 Dysphagia, unspecified: Secondary | ICD-10-CM | POA: Diagnosis present

## 2018-06-06 DIAGNOSIS — I720 Aneurysm of carotid artery: Secondary | ICD-10-CM | POA: Diagnosis present

## 2018-06-06 DIAGNOSIS — Z853 Personal history of malignant neoplasm of breast: Secondary | ICD-10-CM

## 2018-06-06 DIAGNOSIS — G9340 Encephalopathy, unspecified: Secondary | ICD-10-CM | POA: Diagnosis present

## 2018-06-06 DIAGNOSIS — R531 Weakness: Secondary | ICD-10-CM | POA: Diagnosis not present

## 2018-06-06 DIAGNOSIS — Z832 Family history of diseases of the blood and blood-forming organs and certain disorders involving the immune mechanism: Secondary | ICD-10-CM

## 2018-06-06 DIAGNOSIS — Z888 Allergy status to other drugs, medicaments and biological substances status: Secondary | ICD-10-CM

## 2018-06-06 DIAGNOSIS — Z79891 Long term (current) use of opiate analgesic: Secondary | ICD-10-CM

## 2018-06-06 DIAGNOSIS — Z833 Family history of diabetes mellitus: Secondary | ICD-10-CM

## 2018-06-06 DIAGNOSIS — R112 Nausea with vomiting, unspecified: Secondary | ICD-10-CM | POA: Diagnosis present

## 2018-06-06 DIAGNOSIS — Z7984 Long term (current) use of oral hypoglycemic drugs: Secondary | ICD-10-CM

## 2018-06-06 DIAGNOSIS — F05 Delirium due to known physiological condition: Secondary | ICD-10-CM

## 2018-06-06 DIAGNOSIS — I2510 Atherosclerotic heart disease of native coronary artery without angina pectoris: Secondary | ICD-10-CM | POA: Diagnosis present

## 2018-06-06 DIAGNOSIS — F17290 Nicotine dependence, other tobacco product, uncomplicated: Secondary | ICD-10-CM | POA: Diagnosis present

## 2018-06-06 DIAGNOSIS — E1151 Type 2 diabetes mellitus with diabetic peripheral angiopathy without gangrene: Secondary | ICD-10-CM | POA: Diagnosis present

## 2018-06-06 DIAGNOSIS — Z85038 Personal history of other malignant neoplasm of large intestine: Secondary | ICD-10-CM

## 2018-06-06 DIAGNOSIS — E1140 Type 2 diabetes mellitus with diabetic neuropathy, unspecified: Secondary | ICD-10-CM

## 2018-06-06 DIAGNOSIS — E785 Hyperlipidemia, unspecified: Secondary | ICD-10-CM | POA: Diagnosis present

## 2018-06-06 DIAGNOSIS — J45909 Unspecified asthma, uncomplicated: Secondary | ICD-10-CM | POA: Diagnosis present

## 2018-06-06 DIAGNOSIS — E876 Hypokalemia: Secondary | ICD-10-CM | POA: Diagnosis present

## 2018-06-06 DIAGNOSIS — R4020 Unspecified coma: Secondary | ICD-10-CM | POA: Diagnosis not present

## 2018-06-06 DIAGNOSIS — R410 Disorientation, unspecified: Secondary | ICD-10-CM | POA: Diagnosis present

## 2018-06-06 DIAGNOSIS — E860 Dehydration: Secondary | ICD-10-CM | POA: Diagnosis present

## 2018-06-06 DIAGNOSIS — D638 Anemia in other chronic diseases classified elsewhere: Secondary | ICD-10-CM | POA: Diagnosis present

## 2018-06-06 LAB — URINALYSIS, ROUTINE W REFLEX MICROSCOPIC
BILIRUBIN URINE: NEGATIVE
GLUCOSE, UA: NEGATIVE mg/dL
KETONES UR: 5 mg/dL — AB
NITRITE: NEGATIVE
PROTEIN: 100 mg/dL — AB
Specific Gravity, Urine: 1.014 (ref 1.005–1.030)
pH: 6 (ref 5.0–8.0)

## 2018-06-06 LAB — COMPREHENSIVE METABOLIC PANEL
ALT: 11 U/L (ref 0–44)
ANION GAP: 10 (ref 5–15)
AST: 20 U/L (ref 15–41)
Albumin: 3.7 g/dL (ref 3.5–5.0)
Alkaline Phosphatase: 48 U/L (ref 38–126)
BUN: 15 mg/dL (ref 8–23)
CHLORIDE: 109 mmol/L (ref 98–111)
CO2: 23 mmol/L (ref 22–32)
Calcium: 11.5 mg/dL — ABNORMAL HIGH (ref 8.9–10.3)
Creatinine, Ser: 1 mg/dL (ref 0.44–1.00)
GFR calc non Af Amer: 47 mL/min — ABNORMAL LOW (ref >60)
GFR, EST AFRICAN AMERICAN: 55 mL/min — AB (ref >60)
Glucose, Bld: 108 mg/dL — ABNORMAL HIGH (ref 70–99)
POTASSIUM: 3.5 mmol/L (ref 3.5–5.1)
SODIUM: 142 mmol/L (ref 135–145)
Total Bilirubin: 0.8 mg/dL (ref 0.3–1.2)
Total Protein: 7.9 g/dL (ref 6.5–8.1)

## 2018-06-06 LAB — CBC WITH DIFFERENTIAL/PLATELET
ABS IMMATURE GRANULOCYTES: 0 10*3/uL (ref 0.0–0.1)
BASOS ABS: 0 10*3/uL (ref 0.0–0.1)
BASOS PCT: 0 %
Eosinophils Absolute: 0.1 10*3/uL (ref 0.0–0.7)
Eosinophils Relative: 1 %
HCT: 37.4 % (ref 36.0–46.0)
Hemoglobin: 11.3 g/dL — ABNORMAL LOW (ref 12.0–15.0)
Immature Granulocytes: 0 %
Lymphocytes Relative: 31 %
Lymphs Abs: 1.7 10*3/uL (ref 0.7–4.0)
MCH: 22.5 pg — AB (ref 26.0–34.0)
MCHC: 30.2 g/dL (ref 30.0–36.0)
MCV: 74.4 fL — ABNORMAL LOW (ref 78.0–100.0)
MONO ABS: 0.4 10*3/uL (ref 0.1–1.0)
Monocytes Relative: 8 %
NEUTROS ABS: 3.1 10*3/uL (ref 1.7–7.7)
Neutrophils Relative %: 60 %
PLATELETS: 173 10*3/uL (ref 150–400)
RBC: 5.03 MIL/uL (ref 3.87–5.11)
RDW: 16.6 % — ABNORMAL HIGH (ref 11.5–15.5)
WBC: 5.3 10*3/uL (ref 4.0–10.5)

## 2018-06-06 LAB — URINE CULTURE: Culture: >=100000 — AB

## 2018-06-06 MED ORDER — SODIUM CHLORIDE 0.9 % IV SOLN
INTRAVENOUS | Status: AC
  Administered 2018-06-07: 1000 mL via INTRAVENOUS

## 2018-06-06 MED ORDER — ASPIRIN 325 MG PO TABS
325.0000 mg | ORAL_TABLET | Freq: Every day | ORAL | Status: DC
  Administered 2018-06-07 – 2018-06-09 (×3): 325 mg via ORAL
  Filled 2018-06-06 (×3): qty 1

## 2018-06-06 MED ORDER — SODIUM CHLORIDE 0.9 % IV BOLUS
1000.0000 mL | Freq: Once | INTRAVENOUS | Status: AC
  Administered 2018-06-06: 1000 mL via INTRAVENOUS

## 2018-06-06 MED ORDER — CLOPIDOGREL BISULFATE 75 MG PO TABS
75.0000 mg | ORAL_TABLET | Freq: Every day | ORAL | Status: DC
  Administered 2018-06-07 – 2018-06-09 (×3): 75 mg via ORAL
  Filled 2018-06-06 (×3): qty 1

## 2018-06-06 MED ORDER — ONDANSETRON HCL 4 MG/2ML IJ SOLN: 4.0000 mg | Freq: Three times a day (TID) | INTRAMUSCULAR | Status: DC | PRN

## 2018-06-06 MED ORDER — SODIUM CHLORIDE 0.9 % IV SOLN
Freq: Once | INTRAVENOUS | Status: AC
  Administered 2018-06-06: 21:00:00 via INTRAVENOUS

## 2018-06-06 MED ORDER — SODIUM CHLORIDE 0.9 % IV SOLN
2.0000 g | Freq: Once | INTRAVENOUS | Status: AC
  Administered 2018-06-06: 2 g via INTRAVENOUS
  Filled 2018-06-06: qty 20

## 2018-06-06 MED ORDER — SODIUM CHLORIDE 0.9 % IV SOLN: INTRAVENOUS | Status: DC

## 2018-06-06 NOTE — Telephone Encounter (Signed)
Pt daughter is calling on behalf of pt. [name] had a stroke on Monday. Pt is at home but daughter is concerned because [name] Neubauer isn't eating and not taking her medicine. Pt is nausea. Pt would like to talk to Dr. Yisroel Ramming. Please call [phone].  Ottis Stain, CMA

## 2018-06-06 NOTE — ED Provider Notes (Signed)
[facility] EMERGENCY DEPARTMENT Provider Note   CSN: [id number] Arrival date & time: 06/06/18  [place]     History   Chief Complaint Chief Complaint  Patient presents with  . Altered Mental Status    HPI [name] is a 84 y.o. female.  HPI  84 year old female with past medical history as below status post recent admission for TIA here with altered mental status.  Patient was just discharged yesterday.  Per report, the patient has not had anything to eat or drink since discharge.  She was eating and drinking at discharge according to the family.  Patient has had nausea and is vomited up every time she has tried to eat.  She is also been increasingly confused, as well as no longer eating and drinking.  She states that she has not had an appetite.  She has not had any new focal deficits.  Patient is somewhat confused on assessment. She is not sure where she is, doesn't recall much of recent hospitalization.  Level 5 caveat invoked as remainder of history, ROS, and physical exam limited due to patient's AMS.   Past Medical History:  Diagnosis Date  . Allergy   . Anemia    Iron def anemia  . Arthritis   . Asthma   . Breast cancer ([place]) 10/2010   Invasive Ductal Carcinoma, s/p bilateral mastectomy, now on Tamoxifen. Followed by Dr Jamse Arn.   . Breast cancer ([place]) 2011   s/p Bilateral masectomy  . CAD (coronary artery disease)   . Carotid artery aneurysm ([place]) 08/2010   right ICA, 5 x 67mm  . CHF (congestive heart failure) (HCC)    Systolic   . Colon cancer ([place])   . Colon cancer ([place]) 12/11/2013  . DDD (degenerative disc disease), cervical   . Diabetes mellitus   . Diverticulosis   . GERD (gastroesophageal reflux disease)   . H/O: GI bleed   . Hiatal hernia   . History of colon cancer   . Hypercalcemia 06/09/2014  . Hyperlipidemia   . Hypertension   . Myocardial infarction ([place])   . Neuropathy   . Pneumonia 03/2012  . Shortness of breath      Patient Active Problem List   Diagnosis Date Noted  . Altered mental status 06/06/2018  . Microcytic anemia   . TIA (transient ischemic attack) 06/03/2018  . Mobility impaired 04/26/2018  . Disorientation   . Swelling of left hand 12/10/2017  . Cough in adult   . Left knee pain 08/09/2017  . Protein-calorie malnutrition, severe 02/06/2017  . Urinary tract infection without hematuria 02/03/2017  . Hypokalemia 10/13/2016  . Pressure injury of skin 08/31/2016  . Pulmonary embolus ([place]) 08/30/2016  . Mild dementia 06/01/2016  . Depression 01/20/2016  . Suicidal ideation 01/19/2016  . Prolapse of female pelvic organs 12/28/2015  . Leg weakness, bilateral 12/08/2015  . Weakness of right lower extremity 11/25/2015  . Dry eyes 10/28/2015  . (HFpEF) heart failure with preserved ejection fraction ([place])   . CAD in native artery   . Gout 05/14/2015  . Acute delirium 04/15/2015  . Dyslipidemia   . Essential hypertension   . Seasonal allergies 02/10/2015  . Stroke-like symptoms   . Stroke-like episode ([place]) 01/25/2015  . Syncope and collapse 01/25/2015  . Dizziness 09/19/2014  . Anemia of chronic illness 08/19/2014  . Acute superficial venous thrombosis of right lower extremity 08/19/2014  . Hypercalcemia 06/09/2014  . Breast cancer, left breast ([place]) 06/09/2014  .  Breast cancer, right breast ([place]) 06/09/2014  . Edema of left lower extremity 03/30/2014  . PAD (peripheral artery disease) ([place]) 03/30/2014  . History of colon cancer 12/11/2013  . History of fall 05/15/2013  . Bilateral leg pain 03/07/2013  . Lower extremity edema 02/19/2013  . Debility 01/22/2013  . Stricture and stenosis of esophagus 10/27/2012  . Constipation 06/24/2012  . Thoracic back pain 05/24/2012  . Tobacco abuse 06/02/2011  . History of breast cancer in female, bilateral.  Mastectomies 10/24/2010. 04/26/2011  . Hiatal hernia 03/22/2011  . Colon polyp 03/22/2011  . Poor circulation 03/22/2011  .  Cough 01/31/2011  . 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 Neuropathy ([place]) 08/25/2009  . ANEMIA-IRON DEFICIENCY 04/29/2007  . HLD (hyperlipidemia) 01/29/2007  . Controlled type 2 diabetes mellitus without complication, without long-term current use of insulin ([place]) 01/10/2007  . HYPERTENSION, BENIGN SYSTEMIC 01/10/2007  . Coronary atherosclerosis 01/10/2007  . Chronic systolic heart failure ([place]) 01/10/2007  . Asthma 01/10/2007  . Reflux esophagitis 01/10/2007  . Osteoarthritis, multiple sites 01/10/2007    Past Surgical History:  Procedure Laterality Date  . Breast masectomy  10/2010   Bilateral masectomy by Dr Lucia Gaskins s/p invasive ductal carcinoma.  Marland Kitchen BREAST SURGERY    . Cardiac  Catherization  2007   Severe 3-vessel disease.  EF 20-25%.  . ESOPHAGOGASTRODUODENOSCOPY  2001   Esophageal Tear  . ESOPHAGOGASTRODUODENOSCOPY  10/27/2012   Procedure: ESOPHAGOGASTRODUODENOSCOPY (EGD);  Surgeon: Irene Shipper, MD;  [facility] END[facility]COPY;  Service: Endoscopy;  Laterality: N/A;  pat  . JOINT REPLACEMENT Right 2001   Knee  . TOTAL KNEE ARTHROPLASTY  2001     OB History   None      Home Medications    Prior to Admission medications   Medication Sig Start Date End Date Taking? Authorizing Provider  acetaminophen (TYLENOL) 500 MG tablet Take 500 mg by mouth every 6 (six) hours as needed for moderate pain.    Yes [provider]  albuterol (PROVENTIL HFA;VENTOLIN HFA) 108 (90 BASE) MCG/ACT inhaler Inhale 2 puffs into the lungs every 6 (six) hours as needed. For shortness of breath Patient taking differently: Inhale 2 puffs into the lungs every 6 (six) hours as needed for shortness of breath.  01/18/15  Yes [name], MD  Amino Acids-Protein Hydrolys (FEEDING SUPPLEMENT, PRO-STAT SUGAR FREE 64,) LIQD Take 30 mLs by mouth 2 (two) times daily with a meal. ensure   Yes [provider]  carvedilol (COREG) 12.5 MG tablet TAKE 1 TABLET BY MOUTH TWICE A DAY WITH  A MEAL Patient taking differently: Take 12.5 mg by mouth two times a day with a meal 10/25/17  Yes [place] Bing, DO  guaifenesin (ROBITUSSIN) 100 MG/5ML syrup Take 10 mLs (200 mg total) by mouth 3 (three) times daily as needed for cough. 08/15/16  Yes [name], MD  loratadine (CLARITIN) 10 MG tablet TAKE 1/2 TABLET BY MOUTH DAILY Patient taking differently: Take 5 mg by mouth once a day as needed for allergies 12/08/15  Yes [name], MD  metFORMIN (GLUCOPHAGE) 500 MG tablet Take 1 tablet (500 mg total) by mouth daily with breakfast. 07/31/17  Yes [place] Bing, DO  ranitidine (ZANTAC) 150 MG tablet TAKE 1 TABLET BY MOUTH TWICE A DAY Patient taking differently: Take 150 mg by mouth two times a day 10/15/17  Yes  Bing, DO  rosuvastatin (CRESTOR) 20 MG tablet TAKE 1 TABLET BY MOUTH EVERYDAY AT BEDTIME 01/18/18  Yes [place] Bing, DO  senna-docusate (SENOKOT-S) 8.6-50 MG tablet Take 2 tablets by mouth at bedtime. Patient taking differently: Take 1 tablet by mouth every other day.  04/23/17  Yes Bacigalupo, Dionne Bucy, MD  traMADol (ULTRAM) 50 MG tablet Take 1 tablet (50 mg total) by mouth every 8 (eight) hours as needed for severe pain. Patient taking differently: Take 25 mg by mouth every 8 (eight) hours as needed for severe pain.  05/06/18  Yes [place] Bing, DO  aspirin 325 MG tablet Take 1 tablet (325 mg total) by mouth daily. Patient not taking: Reported on 06/06/2018 06/06/18   Meccariello, Bernita Raisin, DO  capsaicin (ZOSTRIX) 0.025 % cream APPLY TO AFFECTED AREA TWICE A DAY Patient not taking: Reported on 06/03/2018 04/11/16   [name], MD  clopidogrel (PLAVIX) 75 MG tablet Take 1 tablet (75 mg total) by mouth daily for 21 days. Then stop. Patient not taking: Reported on 06/06/2018 06/06/18 06/27/18  Meccariello, Bernita Raisin, DO  hydroxypropyl methylcellulose / hypromellose (ISOPTO TEARS / GONIOVISC) 2.5 % ophthalmic solution Place 1 drop 3 (three) times daily  into both eyes. Patient not taking: Reported on 06/03/2018 09/28/17   [place] Bing, DO  pantoprazole (PROTONIX) 40 MG tablet Take 1 tablet (40 mg total) by mouth daily. Patient not taking: Reported on 06/06/2018 06/05/18 07/05/18  Meccariello, Bernita Raisin, DO  PATADAY 0.2 % SOLN APPLY 1 DROP TO EYE DAILY. Patient not taking: Reported on 06/03/2018 11/23/15   [name], MD  petrolatum-hydrophilic-aloe vera (ALOE VESTA) ointment Apply topically 2 (two) times daily as needed for wound care. Patient not taking: Reported on 06/03/2018 05/10/17   Ok Edwards, PA-C  silver sulfADIAZINE (SILVADENE) 1 % cream Apply 1 application topically daily. Patient not taking: Reported on 06/03/2018 05/15/17   Gean Birchwood, DPM  cetirizine (ZYRTEC) 5 MG tablet Take 1 tablet (5 mg total) by mouth daily. Take at night time as it can cause some drowsiness 05/24/12 07/09/12  Los[place], Burnell Blanks, MD    Family History Family History  Problem Relation Age of Onset  . Sickle cell anemia Other   . Heart disease Mother   . Heart disease Father   . Cancer Daughter 34       breast ca  . Hypertension Daughter   . Cancer Daughter 56       breast ca  . Hypertension Daughter   . Heart disease Son        Poor circulation-Left Leg  . Diabetes Daughter   . Hypertension Daughter   . Hyperlipidemia Daughter        Poor circulation- Toe amputation    Social History Social History   Tobacco Use  . Smoking status: Never Smoker  . Smokeless tobacco: Current User    Types: Snuff  Substance Use Topics  . Alcohol use: No  . Drug use: No     Allergies   Tape and Lisinopril   Review of Systems Review of Systems  Unable to perform ROS: Mental status change     Physical Exam Updated Vital Signs BP (!) 188/73 (BP Location: Right Arm)   Pulse 90   Temp 98.7 F (37.1 C) (Oral)   Resp 18   SpO2 98%   Physical Exam  Constitutional: She appears well-developed and well-nourished. No distress.  HENT:  Head:  Normocephalic and atraumatic.  Eyes: Conjunctivae are normal.  Neck: Neck supple.  Cardiovascular: Normal rate, regular rhythm and normal heart sounds. Exam  reveals no friction rub.  No murmur heard. Pulmonary/Chest: Effort normal and breath sounds normal. No respiratory distress. She has no wheezes. She has no rales.  Abdominal: She exhibits no distension. There is tenderness (mild, suprapubic).  Musculoskeletal: She exhibits no edema.  Neurological: She is alert. She is disoriented. No sensory deficit. She exhibits normal muscle tone. Gait normal. GCS eye subscore is 4. GCS verbal subscore is 5. GCS motor subscore is 6.  Skin: Skin is warm. Capillary refill takes less than 2 seconds.  Psychiatric: She has a normal mood and affect.  Nursing note and vitals reviewed.    ED Treatments / Results  Labs (all labs ordered are listed, but only abnormal results are displayed) Labs Reviewed  CBC WITH DIFFERENTIAL/PLATELET - Abnormal; Notable for the following components:      Result Value   Hemoglobin 11.3 (*)    MCV 74.4 (*)    MCH 22.5 (*)    RDW 16.6 (*)    All other components within normal limits  COMPREHENSIVE METABOLIC PANEL - Abnormal; Notable for the following components:   Glucose, Bld 108 (*)    Calcium 11.5 (*)    GFR calc non Af Amer 47 (*)    GFR calc Af Amer 55 (*)    All other components within normal limits  URINE CULTURE  URINALYSIS, ROUTINE W REFLEX MICROSCOPIC    EKG None  Radiology No results found.  Procedures Procedures (including critical care time)  Medications Ordered in ED Medications  cefTRIAXone (ROCEPHIN) 2 g in sodium chloride 0.9 % 100 mL IVPB (has no administration in time range)  sodium chloride 0.9 % bolus 1,000 mL (1,000 mLs Intravenous New Bag/Given 06/06/18 2057)  0.9 %  sodium chloride infusion ( Intravenous New Bag/Given 06/06/18 2057)     Initial Impression / Assessment and Plan / ED Course  I have reviewed the triage vital signs  and the nursing notes.  Pertinent labs & imaging results that were available during my care of the patient were reviewed by me and considered in my medical decision making (see chart for details).     84 year old female status post recent admission here with altered mental status and generalized weakness.  Suspect this may be multifactorial, possibly secondary to UTI.  She had a positive E. coli culture, but was denying any complaints.  On my exam, she does have some mild suprapubic tenderness and reported urinary frequency.  She has not had much urine output today, however, likely due to poor p.o. intake.  Will give a dose of Rocephin.  Given her weakness and deconditioning, with difficulty ambulating around the house, and family inability to care for her, will admit.  Repeat urine sent.  Final Clinical Impressions(s) / ED Diagnoses   Final diagnoses:  Encephalopathy    ED Discharge Orders    None       Duffy Bruce, MD 06/06/18 2211

## 2018-06-06 NOTE — ED Notes (Signed)
Patient transported to X-ray 

## 2018-06-06 NOTE — Telephone Encounter (Signed)
Called and talk to daughter, given minimal response from patient, sleepiness and inability to eat and take medications, recommended she takes patient to the ED for further evaluation.   Thanks  Marjie Skiff, MD [place], PGY-3

## 2018-06-06 NOTE — ED Triage Notes (Signed)
Pt arrived from home after pt's family reported pt not taking medication or performing ADLs since being discharged from [facility] yesterday. Pt was previously admitted for stroke with Left side effect. Pt presents today alert to self and location only. Pt has denies pain or distress at this time. Slight left sided facial asymmetry noted. VSS at triage.

## 2018-06-06 NOTE — ED Notes (Signed)
Please call [name] [phone] and [name] [phone] with updates. Both are caretakers and can come and pick pt up if she needs a ride.

## 2018-06-06 NOTE — H&P (Addendum)
[facility] Admission History and Physical Service Pager: [phone]  [name] Medical record number: [id number] Date of birth: 1924-02-16 Age: 84 y.o. Gender: female  Primary Care Provider: Los Alvarez Bing, DO Consultants: None Code Status: DNR  Chief Complaint: Altered Mental Status  Assessment and Plan: [name] is a 84 y.o. female presenting with altered mental status. PMH is significant for recent hospitalization for TIA, T2DM, HTN, HFpEF, HLD, and GERD, and h/o b/l-breast cancerand colon cancer.  Altered Mental Status: UTI vs Dehydration vs TIA vs Bleed. The patient was recently hospitalized with a TIA (with left-sided facial droop) and AMS. Facial droop resolved soon after admission. The patient's confusion slowly improved throughout her stay. Patient with no focal neurological findings on exam with no facial droop today. Recent MRI on 7/23 with no acute processes noted. On previous admission on 7/22 the patient's urine grew E. Coli, but as this was asymptomatic patient only received 1 dose of CTX in ED. This admission her UA shows leukocytes without nitrites and she was given Rocephin 2g in the ED. UTI could be cause of AMS as it was previously incompletely treated due to lack of sx at the time. Patient is afebrile at 98.7, but expresses having chills. The patient was started on high-dose aspirin and Plavix at last hospital visit, increasing the possibility of a bleed. Will obtain CT head w/o to r/o bleed after start DAPT. Family reported to ED that patient has had decreased PO intake with nausea and vomiting since discharge. This may be contributing to patient's altered state and somnolence as a result of dehydration. She has not urinated since discharge and had to be straight cathed for UA sample. No recent history of falls or TBI that we are aware of, no family in room to help with history. Will speak with family for more information. -  Admit to med-surg, attending Dr. Andria Frames - Most recent MRI showed chronic disease without acute processes - No focal neurological deficits on exam - Head CT to rule out acute process/Bleed - Neuro checks q4  - Is&Os - UA showed Ketones and Leukocytes without Nitrites - Hold ASA and Plavix until CT w/o results - IV NS 75cc/hour x 10 hours, s/p 1L in ED - Zofran for nausea  H/o TIA: Recently admitted for work up of TIA on 7/22. Started on Aspirin 325 and Plavix 75mg  for 3 weeks, and then to DC Plavix and only continue Aspirin 325mg . The patient's antihypertensives were reduced during last admission to carvedilol for permissive hypertension due to her TIA. Losartan and Amlodipine were to be added back one-at-a-time over the next few days, but it is unclear if they were already added back or not.  - Hold ASA and Plavix until CT head w/o results - Continue Coreg; continue holding Amlodipine and Losartan, add back as indicated  UTI: UA in the ED on last admission 7/22 grew E. Coli, patient given 1x dose Rocephin in the ED; UA on this admission was positive for Leukocytes without nitrites. Patient with worse confusion than previous admission.  - No dysuria, urgency, frequency, or suprapubic tenderness illicited on exam, but with AMS - Received 2g Rocephin in the ED - Will add-on urine culture to sample collected prior to start of abx  Vomiting  Low PO Intake: family reports the patient has had low PO intake since recent discharge from hospital as she is nauseous and vomits when she tries to eat. No pain to  palpation on abdominal exam. No sick contacts. Bowel sounds on exam. Ketones present in urine. - Zofran for nausea - s/p 1L bolus in ED, IVF at 75 mL/hr for 10 hr - POCT CBGs  HFpEF, chronic, stable: Last EF 50-55% and G1DD. Takes carvedilol at home, holding losartan and amlodipine. - Continue Carvedilol  HTN: most recent BP 188/73 on arrival. Most recent discharge summary states to  continue Coreg, while Amlodipine and Losartan will be added back as BP tolerates.  - Continue Coreg; continue holding Amlodipine and Losartan - Vitals per routine  T2DM: last A1c 6.7 (06/04/18); Most recent Blood Sugar on admission 108; takes metformin 500mg  at home. - Hold metformin - Add sSSI if indicated - BG checks q4 hours  HLD: last Lipid panel revealed low HDL at 28 (06/04/2018), but is otherwise normal. Takes Crestor 20mg  qHS - Continue Crestor 20mg  daily  GERD: chronic, tasks ranitidine - Famotidine PRN  H/o bilateral breast cancer and colon cancer: chronic, has been stable. - Monitor outpatient  FEN/GI: Regular diet Prophylaxis: hold until after Head CT  Disposition: admit to med-surg  History of Present Illness:  [name] is a 84 y.o. female presenting with AMS. Patient does not have family with her. She is alert and oriented to self and place [facility]), but does not know she's in the hospital, but cannot tell the year. We asked if she would rather be at the hospital or at home. She stated she would rather be in the hospital because "I'm sick, I don't feel good." A few minutes later we told her she was in the hospital and she seemed shocked, stating "I'm sick but I'm not bad!"   No family is currently in the room to help provide history.   Review Of Systems: Per HPI with the following additions:   Review of Systems  Constitutional: Positive for chills.  Respiratory: Negative for shortness of breath.   Cardiovascular: Negative for chest pain.  Gastrointestinal: Negative for abdominal pain, nausea and vomiting.  Neurological: Negative for headaches.    Patient Active Problem List   Diagnosis Date Noted  . Altered mental status 06/06/2018  . Microcytic anemia   . TIA (transient ischemic attack) 06/03/2018  . Mobility impaired 04/26/2018  . Disorientation   . Swelling of left hand 12/10/2017  . Cough in adult   . Left knee pain 08/09/2017  .  Protein-calorie malnutrition, severe 02/06/2017  . Urinary tract infection without hematuria 02/03/2017  . Hypokalemia 10/13/2016  . Pressure injury of skin 08/31/2016  . Pulmonary embolus ([place]) 08/30/2016  . Mild dementia 06/01/2016  . Depression 01/20/2016  . Suicidal ideation 01/19/2016  . Prolapse of female pelvic organs 12/28/2015  . Leg weakness, bilateral 12/08/2015  . Weakness of right lower extremity 11/25/2015  . Dry eyes 10/28/2015  . (HFpEF) heart failure with preserved ejection fraction ([place])   . CAD in native artery   . Gout 05/14/2015  . Acute delirium 04/15/2015  . Dyslipidemia   . Essential hypertension   . Seasonal allergies 02/10/2015  . Stroke-like symptoms   . Stroke-like episode ([place]) 01/25/2015  . Syncope and collapse 01/25/2015  . Dizziness 09/19/2014  . Anemia of chronic illness 08/19/2014  . Acute superficial venous thrombosis of right lower extremity 08/19/2014  . Hypercalcemia 06/09/2014  . Breast cancer, left breast ([place]) 06/09/2014  . Breast cancer, right breast ([place]) 06/09/2014  . Edema of left lower extremity 03/30/2014  . PAD (peripheral artery disease) ([place]) 03/30/2014  . History  of colon cancer 12/11/2013  . History of fall 05/15/2013  . Bilateral leg pain 03/07/2013  . Lower extremity edema 02/19/2013  . Debility 01/22/2013  . Stricture and stenosis of esophagus 10/27/2012  . Constipation 06/24/2012  . Thoracic back pain 05/24/2012  . Tobacco abuse 06/02/2011  . History of breast cancer in female, bilateral.  Mastectomies 10/24/2010. 04/26/2011  . Hiatal hernia 03/22/2011  . Colon polyp 03/22/2011  . Poor circulation 03/22/2011  . Cough 01/31/2011  . 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 Neuropathy ([place]) 08/25/2009  . ANEMIA-IRON DEFICIENCY 04/29/2007  . HLD (hyperlipidemia) 01/29/2007  . Controlled type 2 diabetes mellitus without complication, without long-term current use of insulin ([place]) 01/10/2007  . HYPERTENSION,  BENIGN SYSTEMIC 01/10/2007  . Coronary atherosclerosis 01/10/2007  . Chronic systolic heart failure ([place]) 01/10/2007  . Asthma 01/10/2007  . Reflux esophagitis 01/10/2007  . Osteoarthritis, multiple sites 01/10/2007    Past Medical History: Past Medical History:  Diagnosis Date  . Allergy   . Anemia    Iron def anemia  . Arthritis   . Asthma   . Breast cancer ([place]) 10/2010   Invasive Ductal Carcinoma, s/p bilateral mastectomy, now on Tamoxifen. Followed by Dr Jamse Arn.   . Breast cancer ([place]) 2011   s/p Bilateral masectomy  . CAD (coronary artery disease)   . Carotid artery aneurysm ([place]) 08/2010   right ICA, 5 x 89mm  . CHF (congestive heart failure) (H[facility])    Systolic   . Colon cancer ([place])   . Colon cancer ([place]) 12/11/2013  . DDD (degenerative disc disease), cervical   . Diabetes mellitus   . Diverticulosis   . GERD (gastroesophageal reflux disease)   . H/O: GI bleed   . Hiatal hernia   . History of colon cancer   . Hypercalcemia 06/09/2014  . Hyperlipidemia   . Hypertension   . Myocardial infarction ([place])   . Neuropathy   . Pneumonia 03/2012  . Shortness of breath     Past Surgical History: Past Surgical History:  Procedure Laterality Date  . Breast masectomy  10/2010   Bilateral masectomy by Dr Lucia Gaskins s/p invasive ductal carcinoma.  Marland Kitchen BREAST SURGERY    . Cardiac  Catherization  2007   Severe 3-vessel disease.  EF 20-25%.  . ESOPHAGOGASTRODUODENOSCOPY  2001   Esophageal Tear  . ESOPHAGOGASTRODUODENOSCOPY  10/27/2012   Procedure: ESOPHAGOGASTRODUODENOSCOPY (EGD);  Surgeon: Irene Shipper, MD;  Location: [facility] ENDOSCOPY;  Service: Endoscopy;  Laterality: N/A;  pat  . JOINT REPLACEMENT Right 2001   Knee  . TOTAL KNEE ARTHROPLASTY  2001    Social History: Social History   Tobacco Use  . Smoking status: Never Smoker  . Smokeless tobacco: Current User    Types: Snuff  Substance Use Topics  . Alcohol use: No  . Drug use: No   Please also refer to relevant  sections of EMR.  Family History: Family History  Problem Relation Age of Onset  . Sickle cell anemia Other   . Heart disease Mother   . Heart disease Father   . Cancer Daughter 16       breast ca  . Hypertension Daughter   . Cancer Daughter 59       breast ca  . Hypertension Daughter   . Heart disease Son        Poor circulation-Left Leg  . Diabetes Daughter   . Hypertension Daughter   . Hyperlipidemia Daughter        Poor circulation-  Toe amputation   Allergies and Medications: Allergies  Allergen Reactions  . Tape Other (See Comments)    Skin is very thin and will tear and bruise easily!!  . Lisinopril Cough   No current facility-administered medications on file prior to encounter.    Current Outpatient Medications on File Prior to Encounter  Medication Sig Dispense Refill  . acetaminophen (TYLENOL) 500 MG tablet Take 500 mg by mouth every 6 (six) hours as needed for moderate pain.     Marland Kitchen albuterol (PROVENTIL HFA;VENTOLIN HFA) 108 (90 BASE) MCG/ACT inhaler Inhale 2 puffs into the lungs every 6 (six) hours as needed. For shortness of breath (Patient taking differently: Inhale 2 puffs into the lungs every 6 (six) hours as needed for shortness of breath. ) 1 Inhaler 6  . Amino Acids-Protein Hydrolys (FEEDING SUPPLEMENT, PRO-STAT SUGAR FREE 64,) LIQD Take 30 mLs by mouth 2 (two) times daily with a meal. ensure    . carvedilol (COREG) 12.5 MG tablet TAKE 1 TABLET BY MOUTH TWICE A DAY WITH A MEAL (Patient taking differently: Take 12.5 mg by mouth two times a day with a meal) 60 tablet 6  . guaifenesin (ROBITUSSIN) 100 MG/5ML syrup Take 10 mLs (200 mg total) by mouth 3 (three) times daily as needed for cough. 120 mL 0  . loratadine (CLARITIN) 10 MG tablet TAKE 1/2 TABLET BY MOUTH DAILY (Patient taking differently: Take 5 mg by mouth once a day as needed for allergies) 15 tablet 0  . metFORMIN (GLUCOPHAGE) 500 MG tablet Take 1 tablet (500 mg total) by mouth daily with breakfast. 90  tablet 3  . ranitidine (ZANTAC) 150 MG tablet TAKE 1 TABLET BY MOUTH TWICE A DAY (Patient taking differently: Take 150 mg by mouth two times a day) 60 tablet 3  . rosuvastatin (CRESTOR) 20 MG tablet TAKE 1 TABLET BY MOUTH EVERYDAY AT BEDTIME 30 tablet 6  . senna-docusate (SENOKOT-S) 8.6-50 MG tablet Take 2 tablets by mouth at bedtime. (Patient taking differently: Take 1 tablet by mouth every other day. ) 60 tablet 3  . traMADol (ULTRAM) 50 MG tablet Take 1 tablet (50 mg total) by mouth every 8 (eight) hours as needed for severe pain. (Patient taking differently: Take 25 mg by mouth every 8 (eight) hours as needed for severe pain. ) 30 tablet 0  . aspirin 325 MG tablet Take 1 tablet (325 mg total) by mouth daily. (Patient not taking: Reported on 06/06/2018) 30 tablet 0  . capsaicin (ZOSTRIX) 0.025 % cream APPLY TO AFFECTED AREA TWICE A DAY (Patient not taking: Reported on 06/03/2018) 60 g 1  . clopidogrel (PLAVIX) 75 MG tablet Take 1 tablet (75 mg total) by mouth daily for 21 days. Then stop. (Patient not taking: Reported on 06/06/2018) 21 tablet 0  . hydroxypropyl methylcellulose / hypromellose (ISOPTO TEARS / GONIOVISC) 2.5 % ophthalmic solution Place 1 drop 3 (three) times daily into both eyes. (Patient not taking: Reported on 06/03/2018) 15 mL 12  . pantoprazole (PROTONIX) 40 MG tablet Take 1 tablet (40 mg total) by mouth daily. (Patient not taking: Reported on 06/06/2018) 30 tablet 0  . PATADAY 0.2 % SOLN APPLY 1 DROP TO EYE DAILY. (Patient not taking: Reported on 06/03/2018) 2.5 mL 0  . petrolatum-hydrophilic-aloe vera (ALOE VESTA) ointment Apply topically 2 (two) times daily as needed for wound care. (Patient not taking: Reported on 06/03/2018) 226 g 0  . silver sulfADIAZINE (SILVADENE) 1 % cream Apply 1 application topically daily. (Patient not taking: Reported on  06/03/2018) 50 g 0  . [DISCONTINUED] cetirizine (ZYRTEC) 5 MG tablet Take 1 tablet (5 mg total) by mouth daily. Take at night time as it can  cause some drowsiness 30 tablet 4    Objective: BP (!) 188/73 (BP Location: Right Arm)   Pulse 90   Temp 98.7 F (37.1 C) (Oral)   Resp 18   SpO2 98%  Physical Exam  Constitutional:  Frail  HENT:  Head: Normocephalic and atraumatic.  Eyes:  Cannot cooperate with exam to check EOMs  Neck: Normal range of motion.  Cardiovascular: Normal rate, regular rhythm, normal heart sounds and intact distal pulses.  Pulmonary/Chest: Effort normal and breath sounds normal. No respiratory distress.  Abdominal: Soft. Bowel sounds are normal. There is no tenderness.  Musculoskeletal: Normal range of motion. She exhibits no edema or deformity.  Neurological: She is alert. She exhibits normal muscle tone.  Skin: Skin is warm and dry. Capillary refill takes less than 2 seconds. She is not diaphoretic.  Psychiatric:  Somnolent   Labs and Imaging: CBC BMET  Recent Labs  Lab 06/06/18 1942  WBC 5.3  HGB 11.3*  HCT 37.4  PLT 173   Recent Labs  Lab 06/06/18 1942  NA 142  K 3.5  CL 109  CO2 23  BUN 15  CREATININE 1.00  GLUCOSE 108*  CALCIUM 11.5*     UA (7/25): hazy with ketones and leukocytes, without nitrites Urine Cx (7/25): pending  Mr Virgel Paling QA Contrast Result Date: 06/04/2018 CLINICAL DATA:  Altered mental status. Assess TIA. History of breast and lung cancer, RIGHT ICA aneurysm, hypertension, hyperlipidemia, diabetes. EXAM: MRI HEAD WITHOUT CONTRAST MRA HEAD WITHOUT CONTRAST MRA NECK WITHOUT AND WITH CONTRAST TECHNIQUE: Multiplanar, multiecho pulse sequences of the brain and surrounding structures were obtained without intravenous contrast. Angiographic images of the Circle of Willis were obtained using MRA technique without intravenous contrast. Angiographic images of the neck were obtained using MRA technique without and with intravenous contrast. Carotid stenosis measurements (when applicable) are obtained utilizing NASCET criteria, using the distal internal carotid diameter as  the denominator. CONTRAST:  38mL MULTIHANCE GADOBENATE DIMEGLUMINE 529 MG/ML IV SOLN COMPARISON:  None. FINDINGS: MRI HEAD FINDINGS INTRACRANIAL CONTENTS: No reduced diffusion to suggest acute ischemia. A few scattered chronic microhemorrhages present. The ventricles and sulci are normal for patient's age. Old RIGHT basal ganglia and thalami lacunar infarcts. Old small LEFT cerebellar infarcts. Patchy to confluent supratentorial white matter FLAIR T2 hyperintensities. No suspicious parenchymal signal, masses, mass effect. No abnormal extra-axial fluid collections. No extra-axial masses. VASCULAR: Normal major intracranial vascular flow voids present at skull base. SKULL AND UPPER CERVICAL SPINE: No abnormal sellar expansion. No suspicious calvarial bone marrow signal. Craniocervical junction maintained. SINUSES/ORBITS: The mastoid air-cells and included paranasal sinuses are well-aerated.The included ocular globes and orbital contents are non-suspicious. Status post bilateral ocular lens implants. Patient is edentulous. MRA HEAD FINDINGS ANTERIOR CIRCULATION: Flow related enhancement of the included cervical, petrous, cavernous and supraclinoid internal carotid arteries. 3 mm L pouching LEFT cavernous ICA. Fusiform aneurysm RIGHT cavernous ICA measuring to 6 mm. Moderate RIGHT and severe stenosis LEFT paraophthalmic segments. 2 mm aneurysm RIGHT A1 2 junction. Patent anterior communicating artery. Patent anterior and middle cerebral arteries. Multifocal bilateral MCA stenosis. No large vessel occlusion, flow limiting stenosis, aneurysm. POSTERIOR CIRCULATION: LEFT vertebral artery is dominant. Vertebrobasilar arteries are patent, with normal flow related enhancement of the main branch vessels. Moderate luminal irregularity LEFT vertebral artery and basilar artery compatible with atherosclerosis. Patent  posterior cerebral arteries. Mild stenosis LEFT P2 segment. No large vessel occlusion, flow limiting stenosis.  ANATOMIC VARIANTS: None. Source images and MIP images were reviewed. MRA NECK FINDINGS ANTERIOR CIRCULATION: Arch vessel origins are patent. Moderate stenosis bilateral subclavian arteries, LEFT axillary artery the common carotid arteries are widely patent bilaterally. The carotid bifurcations are patent bilaterally without hemodynamically significant stenosis by NASCET criteria. No flow limiting stenosis or luminal irregularity. POSTERIOR CIRCULATION: Bilateral vertebral arteries are patent to the vertebrobasilar junction. Severe stenosis LEFT V1 segment. Source images and MIP images were reviewed. IMPRESSION: MRI HEAD: 1. No acute intracranial process. 2. Moderate chronic small vessel ischemic disease. 3. Old small basal ganglia, thalami and LEFT cerebellar infarcts. MRA HEAD: 1. No emergent large vessel occlusion. 2. Severe LEFT and moderate RIGHT ICA stenosis, paraophthalmic segment. 2 mm RIGHT A1 2 junction aneurysm. 3. Multifocal moderate stenosis bilateral MCA, LEFT vertebral artery and basilar artery compatible with atherosclerosis. 4. 3 mm aneurysm LEFT cavernous ICA. 6 mm fusiform aneurysm RIGHT ICA. MRA NECK: 1. No hemodynamically significant stenosis internal carotid arteries. 2. Patent vertebral arteries, severe stenosis LEFT V1 segment. Electronically Signed   By: Elon Alas M.D.   On: 06/04/2018 20:44    Daisy Floro, DO 06/06/2018, 9:56 PM PGY-1, [place] Intern pager: [phone], text pages welcome  FPTS Upper-Level Resident Addendum   I have independently interviewed and examined the patient. I have discussed the above with the original author and agree with their documentation. My edits for correction/addition/clarification are in purple. Please see also any attending notes.    [place] Sole Lengacher, DO PGY-2, [facility] 06/06/2018 11:24 PM  [place] Service pager: [phone] (text pages welcome through [facility])

## 2018-06-07 ENCOUNTER — Ambulatory Visit: Payer: Self-pay

## 2018-06-07 ENCOUNTER — Other Ambulatory Visit: Payer: Medicare Other

## 2018-06-07 DIAGNOSIS — I110 Hypertensive heart disease with heart failure: Secondary | ICD-10-CM | POA: Diagnosis present

## 2018-06-07 DIAGNOSIS — E1151 Type 2 diabetes mellitus with diabetic peripheral angiopathy without gangrene: Secondary | ICD-10-CM | POA: Diagnosis present

## 2018-06-07 DIAGNOSIS — R410 Disorientation, unspecified: Secondary | ICD-10-CM | POA: Diagnosis present

## 2018-06-07 DIAGNOSIS — F0390 Unspecified dementia without behavioral disturbance: Secondary | ICD-10-CM | POA: Diagnosis present

## 2018-06-07 DIAGNOSIS — E876 Hypokalemia: Secondary | ICD-10-CM | POA: Diagnosis present

## 2018-06-07 DIAGNOSIS — E785 Hyperlipidemia, unspecified: Secondary | ICD-10-CM | POA: Diagnosis present

## 2018-06-07 DIAGNOSIS — F05 Delirium due to known physiological condition: Secondary | ICD-10-CM | POA: Diagnosis not present

## 2018-06-07 DIAGNOSIS — E119 Type 2 diabetes mellitus without complications: Secondary | ICD-10-CM | POA: Diagnosis not present

## 2018-06-07 DIAGNOSIS — Z8673 Personal history of transient ischemic attack (TIA), and cerebral infarction without residual deficits: Secondary | ICD-10-CM | POA: Diagnosis not present

## 2018-06-07 DIAGNOSIS — Z86010 Personal history of colonic polyps: Secondary | ICD-10-CM | POA: Diagnosis not present

## 2018-06-07 DIAGNOSIS — I5032 Chronic diastolic (congestive) heart failure: Secondary | ICD-10-CM | POA: Diagnosis present

## 2018-06-07 DIAGNOSIS — R112 Nausea with vomiting, unspecified: Secondary | ICD-10-CM | POA: Diagnosis present

## 2018-06-07 DIAGNOSIS — I2510 Atherosclerotic heart disease of native coronary artery without angina pectoris: Secondary | ICD-10-CM | POA: Diagnosis present

## 2018-06-07 DIAGNOSIS — Z85038 Personal history of other malignant neoplasm of large intestine: Secondary | ICD-10-CM | POA: Diagnosis not present

## 2018-06-07 DIAGNOSIS — Z66 Do not resuscitate: Secondary | ICD-10-CM | POA: Diagnosis present

## 2018-06-07 DIAGNOSIS — D509 Iron deficiency anemia, unspecified: Secondary | ICD-10-CM | POA: Diagnosis present

## 2018-06-07 DIAGNOSIS — R4182 Altered mental status, unspecified: Secondary | ICD-10-CM | POA: Diagnosis not present

## 2018-06-07 DIAGNOSIS — E860 Dehydration: Secondary | ICD-10-CM | POA: Diagnosis present

## 2018-06-07 DIAGNOSIS — K210 Gastro-esophageal reflux disease with esophagitis: Secondary | ICD-10-CM | POA: Diagnosis present

## 2018-06-07 DIAGNOSIS — D638 Anemia in other chronic diseases classified elsewhere: Secondary | ICD-10-CM | POA: Diagnosis present

## 2018-06-07 DIAGNOSIS — Z96651 Presence of right artificial knee joint: Secondary | ICD-10-CM | POA: Diagnosis present

## 2018-06-07 DIAGNOSIS — K219 Gastro-esophageal reflux disease without esophagitis: Secondary | ICD-10-CM | POA: Diagnosis present

## 2018-06-07 DIAGNOSIS — M1990 Unspecified osteoarthritis, unspecified site: Secondary | ICD-10-CM | POA: Diagnosis present

## 2018-06-07 DIAGNOSIS — Z853 Personal history of malignant neoplasm of breast: Secondary | ICD-10-CM | POA: Diagnosis not present

## 2018-06-07 DIAGNOSIS — Z7984 Long term (current) use of oral hypoglycemic drugs: Secondary | ICD-10-CM | POA: Diagnosis not present

## 2018-06-07 DIAGNOSIS — N390 Urinary tract infection, site not specified: Secondary | ICD-10-CM | POA: Diagnosis present

## 2018-06-07 DIAGNOSIS — G9340 Encephalopathy, unspecified: Secondary | ICD-10-CM | POA: Diagnosis present

## 2018-06-07 LAB — CBC
HEMATOCRIT: 32.7 % — AB (ref 36.0–46.0)
Hemoglobin: 10 g/dL — ABNORMAL LOW (ref 12.0–15.0)
MCH: 22.6 pg — ABNORMAL LOW (ref 26.0–34.0)
MCHC: 30.6 g/dL (ref 30.0–36.0)
MCV: 73.8 fL — ABNORMAL LOW (ref 78.0–100.0)
Platelets: 160 10*3/uL (ref 150–400)
RBC: 4.43 MIL/uL (ref 3.87–5.11)
RDW: 16.2 % — AB (ref 11.5–15.5)
WBC: 5 10*3/uL (ref 4.0–10.5)

## 2018-06-07 LAB — BASIC METABOLIC PANEL
Anion gap: 11 (ref 5–15)
BUN: 12 mg/dL (ref 8–23)
CO2: 22 mmol/L (ref 22–32)
Calcium: 10.5 mg/dL — ABNORMAL HIGH (ref 8.9–10.3)
Chloride: 109 mmol/L (ref 98–111)
Creatinine, Ser: 0.8 mg/dL (ref 0.44–1.00)
GFR calc Af Amer: >60 mL/min (ref >60)
GLUCOSE: 95 mg/dL (ref 70–99)
POTASSIUM: 3.2 mmol/L — AB (ref 3.5–5.1)
Sodium: 142 mmol/L (ref 135–145)

## 2018-06-07 MED ORDER — LOSARTAN POTASSIUM 25 MG PO TABS
25.0000 mg | ORAL_TABLET | Freq: Every day | ORAL | Status: DC
  Administered 2018-06-07: 25 mg via ORAL
  Filled 2018-06-07: qty 1

## 2018-06-07 MED ORDER — ACETAMINOPHEN 325 MG PO TABS: 650.0000 mg | ORAL_TABLET | Freq: Four times a day (QID) | ORAL | Status: DC | PRN

## 2018-06-07 MED ORDER — CARVEDILOL 12.5 MG PO TABS
12.5000 mg | ORAL_TABLET | Freq: Two times a day (BID) | ORAL | Status: DC
  Administered 2018-06-07 – 2018-06-09 (×5): 12.5 mg via ORAL
  Filled 2018-06-07 (×5): qty 1

## 2018-06-07 MED ORDER — CEPHALEXIN 250 MG PO CAPS: 250.0000 mg | ORAL_CAPSULE | Freq: Four times a day (QID) | ORAL | Status: DC

## 2018-06-07 MED ORDER — CEPHALEXIN 250 MG PO CAPS
250.0000 mg | ORAL_CAPSULE | Freq: Three times a day (TID) | ORAL | Status: DC
  Administered 2018-06-07 – 2018-06-09 (×7): 250 mg via ORAL
  Filled 2018-06-07 (×7): qty 1

## 2018-06-07 MED ORDER — ONDANSETRON HCL 4 MG PO TABS: 4.0000 mg | ORAL_TABLET | Freq: Three times a day (TID) | ORAL | Status: DC | PRN

## 2018-06-07 MED ORDER — ROSUVASTATIN CALCIUM 20 MG PO TABS
20.0000 mg | ORAL_TABLET | Freq: Every day | ORAL | Status: DC
  Administered 2018-06-07 – 2018-06-09 (×3): 20 mg via ORAL
  Filled 2018-06-07 (×3): qty 1

## 2018-06-07 MED ORDER — POTASSIUM CHLORIDE CRYS ER 20 MEQ PO TBCR
40.0000 meq | EXTENDED_RELEASE_TABLET | Freq: Once | ORAL | Status: AC
Start: 2018-06-07 — End: 2018-06-07
  Administered 2018-06-07: 40 meq via ORAL
  Filled 2018-06-07: qty 2

## 2018-06-07 MED ORDER — ONDANSETRON HCL 4 MG/2ML IJ SOLN: 4.0000 mg | Freq: Three times a day (TID) | INTRAMUSCULAR | Status: DC | PRN

## 2018-06-07 MED ORDER — SODIUM CHLORIDE 0.9 % IV SOLN
INTRAVENOUS | Status: AC
  Administered 2018-06-07: 13:00:00 via INTRAVENOUS

## 2018-06-07 MED ORDER — POLYETHYLENE GLYCOL 3350 17 G PO PACK
17.0000 g | PACK | Freq: Every day | ORAL | Status: DC | PRN
  Administered 2018-06-08: 17 g via ORAL
  Filled 2018-06-07: qty 1

## 2018-06-07 MED ORDER — ACETAMINOPHEN 650 MG RE SUPP
650.0000 mg | Freq: Four times a day (QID) | RECTAL | Status: DC | PRN
Start: 2018-06-07 — End: 2018-06-09

## 2018-06-07 NOTE — Progress Notes (Addendum)
Family Medicine Teaching Service Daily Progress Note Intern Pager: [phone]  [name] Medical record number: [id number] Date of birth: [birth year] Age: 84 y.o. Gender: female  Primary Care Provider: [place] Bing, DO Consultants: None Code Status: DNR  Pt Overview and Major Events to Date:  7/25 Admitted for AMS  Assessment and Plan: [name] is a 84 yo female who presented with concerns for AMS.  Her PMHx is significant for recent discharge following TIA, T2DM, HTN, HFpEF, HLD, and GERD, as well as history of bilateral breast cancer and colon cancer.  AMS likely 2/2 UTI vs Dehydration Poor PO intake since discharge.  Patient has had nausea and vomiting.  Urine culture during last admission grew E coli.  UA in ED positive for ketones, leukocytes, without nitrites.  CT shows no acute bleed.  On IVF NS 75cc/hr currently, s/p 1 L in ED.  This AM patient awake and alert, but oriented to person only, has difficulty following commands.  Her mental status is decreased from her previous admission.  Unsure if patient is urinating as was not recorded due to order not being for strict monitoring of I/O.   - treat for UTI - Keflex 250mg  q6h x 7 days - cont to monitor  - monitor strict I/O  H/o TIA Recently discharged following TIA. Difficult to complete neuro exam due to lack of patient cooperation, but appears grossly neurologically intact.  Alert, oriented to person only, has difficulty following commands, which is changed from her previous admission.  On DAPT for then weeks, then will d/c Plavix and continue ASA 325mg , held on admission for concern of bleed.  CT shows no acute bleed. -restart ASA and plavix as no concern for acute bleed - cont Neuro checks q4h  UTI Urine culture 7/22 grew E coli, received one dose Rocephin in ED, but treatment not continued as patient was asymptomatic. Confusion has worsened since her discharge.  UA in ED on 7/25 positive for ketones,  leukocytes, without nitrites. Patient had not urinated since her discharge on  7/24, also not drinking since then.  Received 2g Rocephin in ED on 7/25.  WBC 5.0 this AM.  Patient has remained afebrile, but room is very hot and patient states that she is cold. - will treat UTI due to worsening in mental status - Keflex 250mg  q6h x 7 days - f/u urine culture  Vomiting Patient has been nauseous and vomiting when she tries to eat since discharge.  Patient had a full plate of food next to her during examination and she did not want to eat it.  Does not report vomiting, but not reliable history. - cont Zofran prn nausea - cont IVF at 75cc/hr x 10hrs  - POCT CBGs  Hypokalemia K 3.2 this AM.  Received K-Dur 53mEq once after lab draw this AM. - cont to monitor BMP  HFpEF: chronic, stable Last EF 50-55% and G1DD.  On carvedilol.  Had been on losartan and amlodipine, being held since last admission and to be added back as BP tolerates. - cont carvedilol  HTN: Chronic BP 156/78 this AM.  As high as 180s SBP overnight.  Losartan and Amlodipine held since last admission and to be added back as BP tolerates. - cont Coreg - restart Losartan at 25mg  QD, can consider increasing to home dose 50mg  as needed - amlodipine may not need to be restarted, cont to consider readdition  T2DM: Well-controlled Last A1c 06/04/18 6.7.  On metformin 500mg .  A1c goal per Neuro <7. - cont to hold metformin -CBG checks q4hrs -consider sSSI as indicated   HLD: Chronic On crestor 20mg  QD - cont home Crestor  GERD: Chronic On Ranitidine at home - famotidine PRN  HX B/L Breast cancer and Colon cancer: Chronic, stable - f/u outpatient  FEN/GI: Regular diet PPx: SCDs  Disposition: pending clinical improvement  Subjective:  Patient denies any complaints this AM aside from being cold.  It is of note that her room was very warm during the exam.  Patient's subjective history not reliable due to her mental status.  No  family available at bedside.  Objective: Temp:  [98.2 F (36.8 C)-98.7 F (37.1 C)] 98.4 F (36.9 C) (07/26 0315) Pulse Rate:  [83-90] 85 (07/26 0315) Resp:  [18-25] 20 (07/26 0315) BP: (144-188)/(52-94) 156/78 (07/26 0315) SpO2:  [97 %-99 %] 97 % (07/26 0315)  Physical Exam: General: 84 y.o. y.o. female in NAD, lying in bed Eyes: PERRL, patient has difficulty following commands, unable to assess EOMs but appear full during examination Cardio: RRR no m/r/g Lungs: CTAB, no wheezing, no rhonchi, no crackles Abdomen: Soft, non-tender to palpation, positive bowel sounds Skin: warm and dry Extremities: No edema Neuro: Alert & oriented x1, to self only, states that both year and location are [place]", neurologically grossly intact, has difficulty following commands throughout intact, normal muscle tone, 5/5 strength in hands, unable to assess leg strength due to lack of cooperation, no facial droop noted, patient seems agreeable to all questions asked   Laboratory: Recent Labs  Lab 06/05/18 0456 06/06/18 1942 06/07/18 0412  WBC 4.4 5.3 5.0  HGB 10.6* 11.3* 10.0*  HCT 34.8* 37.4 32.7*  PLT 161 173 160   Recent Labs  Lab 06/03/18 1817  06/05/18 0456 06/06/18 1942 06/07/18 0412  NA 140   < > 140 142 142  K 3.4*   < > 3.8 3.5 3.2*  CL 106   < > 110 109 109  CO2 24   < > 23 23 22   BUN 9   < > 8 15 12   CREATININE 0.92   < > 0.96 1.00 0.80  CALCIUM 11.2*   < > 11.0* 11.5* 10.5*  PROT 7.7  --   --  7.9  --   BILITOT 0.5  --   --  0.8  --   ALKPHOS 59  --   --  48  --   ALT 10  --   --  11  --   AST 16  --   --  20  --   GLUCOSE 158*   < > 127* 108* 95   < > = values in this interval not displayed.      Imaging/Diagnostic Tests: Ct Head W/o Contrast  Result Date: 06/06/2018 CLINICAL DATA:  Altered LOC EXAM: CT HEAD WITHOUT CONTRAST TECHNIQUE: Contiguous axial images were obtained from the base of the skull through the vertex without intravenous contrast.  COMPARISON:  MRI 06/04/2018, head CT 06/03/2018 FINDINGS: Brain: No acute territorial infarction, hemorrhage, or intracranial mass is visualized. Atrophy and small vessel ischemic changes of the white matter. Chronic lacunar infarcts in the right basal ganglia. Stable ventricle size. Old lacunar infarct left cerebellum. Vascular: No hyperdense vessels. Vertebral and carotid vascular calcification Skull: No fracture Sinuses/Orbits: Mild mucosal thickening in the maxillary and ethmoid sinuses Other: None IMPRESSION: 1. No CT evidence for acute intracranial abnormality. 2. Atrophy and small vessel ischemic changes of the white matter. Chronic  lacunar infarcts in the basal ganglia and left cerebellum. Electronically Signed   By: Donavan Foil M.D.   On: 06/06/2018 23:43    [place], Bernita Raisin, DO 06/07/2018, 6:42 AM PGY-1, [place] Intern pager: [phone], text pages welcome

## 2018-06-07 NOTE — Evaluation (Signed)
Clinical/Bedside Swallow Evaluation Patient Details  Name: [name] MRN: [id number] Date of Birth: 03-17-1924  Today's Date: 06/07/2018 Time: SLP Start Time (ACUTE ONLY): 1545 SLP Stop Time (ACUTE ONLY): 1553 SLP Time Calculation (min) (ACUTE ONLY): 8 min  Past Medical History:  Past Medical History:  Diagnosis Date  . Allergy   . Anemia    Iron def anemia  . Arthritis   . Asthma   . Breast cancer ([facility]) 10/2010   Invasive Ductal Carcinoma, s/p bilateral mastectomy, now on Tamoxifen. Followed by Dr Jamse Arn.   . Breast cancer ([facility]) 2011   s/p Bilateral masectomy  . CAD (coronary artery disease)   . Carotid artery aneurysm ([facility]) 08/2010   right ICA, 5 x 34mm  . CHF (congestive heart failure) ([facility])    Systolic   . Colon cancer ([facility])   . Colon cancer ([facility]) 12/11/2013  . DDD (degenerative disc disease), cervical   . Diabetes mellitus   . Diverticulosis   . GERD (gastroesophageal reflux disease)   . H/O: GI bleed   . Hiatal hernia   . History of colon cancer   . Hypercalcemia 06/09/2014  . Hyperlipidemia   . Hypertension   . Myocardial infarction ([facility])   . Neuropathy   . Pneumonia 03/2012  . Shortness of breath    Past Surgical History:  Past Surgical History:  Procedure Laterality Date  . Breast masectomy  10/2010   Bilateral masectomy by Dr Lucia Gaskins s/p invasive ductal carcinoma.  Marland Kitchen BREAST SURGERY    . Cardiac  Catherization  2007   Severe 3-vessel disease.  EF 20-25%.  . ESOPHAGOGASTRODUODENOSCOPY  2001   Esophageal Tear  . ESOPHAGOGASTRODUODENOSCOPY  10/27/2012   Procedure: ESOPHAGOGASTRODUODENOSCOPY (EGD);  Surgeon: Irene Shipper, MD;  Location: [facility]COPY;  Service: Endoscopy;  Laterality: N/A;  pat  . JOINT REPLACEMENT Right 2001   Knee  . TOTAL KNEE ARTHROPLASTY  2001   HPI:  [name] is a 84 yo female who presented with concerns for AMS.  Her PMHx is significant for recent discharge following TIA, T2DM, HTN, HFpEF, HLD, hiatal hernia and GERD, as well  as history of bilateral breast cancer and colon cancer. Admitted with AMS 2/2 UTI vs dehydration. CT head negative for acute abnormality, chronic lacunar infarcts in basal ganglia, left cerebellum.    Assessment / Plan / Recommendation Clinical Impression  Patient presents with oropharyngeal swallow which appears at bedside to be within functional limits with adequate airway protection. No focal cranial nerve deficits; pt is fully alert and following all basic commands. No overt signs of aspiration observed despite challenging with consecutive straw sips of thin liquids in excess of 3oz. She takes large bites of regular solid, however mastication is [name] and pt with adequate oral clearance. RN states pt had difficulty with pills this morning. Recommend regular diet with thin liquids, can attempt smaller medications whole in puree or crush if needed. No further skilled ST needs identified. Will s/o.   SLP Visit Diagnosis: Dysphagia, unspecified (R13.10)    Aspiration Risk  Mild aspiration risk    Diet Recommendation Regular;Thin liquid   Liquid Administration via: Cup;Straw Medication Administration: (as tolerated) Supervision: Patient able to self feed Compensations: Slow rate;Small sips/bites Postural Changes: Seated upright at 90 degrees;Remain upright for at least 30 minutes after po intake    Other  Recommendations Oral Care Recommendations: Oral care BID   Follow up Recommendations 24 hour supervision/assistance      Frequency and Duration  Prognosis        Swallow Study   General Date of Onset: 06/06/18 HPI: [name] is a 84 yo female who presented with concerns for AMS.  Her PMHx is significant for recent discharge following TIA, T2DM, HTN, HFpEF, HLD, hiatal hernia and GERD, as well as history of bilateral breast cancer and colon cancer. Admitted with AMS 2/2 UTI vs dehydration. CT head negative for acute abnormality, chronic lacunar infarcts in basal ganglia,  left cerebellum.  Type of Study: Bedside Swallow Evaluation Previous Swallow Assessment: none in chart Diet Prior to this Study: NPO Temperature Spikes Noted: No Respiratory Status: Room air History of Recent Intubation: No Behavior/Cognition: Alert;Cooperative Oral Cavity Assessment: Within Functional Limits Oral Care Completed by SLP: No Oral Cavity - Dentition: Dentures, top;Dentures, bottom Vision: Functional for self-feeding Self-Feeding Abilities: Able to feed self Patient Positioning: Upright in bed Baseline Vocal Quality: Normal Volitional Cough: Strong Volitional Swallow: Able to elicit    Oral/Motor/Sensory Function Overall Oral Motor/Sensory Function: Within functional limits   Ice Chips Ice chips: Not tested   Thin Liquid Thin Liquid: Within functional limits Presentation: Cup;Straw;Self Fed    Nectar Thick Nectar Thick Liquid: Not tested   Honey Thick Honey Thick Liquid: Not tested   Puree Puree: Not tested(pt declined)   Solid     Solid: Within functional limits Presentation: [place], MS, CCC-SLP Speech-Language Pathologist [phone]  Aliene Altes 06/07/2018,3:59 PM

## 2018-06-07 NOTE — Progress Notes (Signed)
FPTS Interim Progress Note  S:Paged by nurse that patient having difficulty swallowing.  Patient seen and examined at bedside.  Patient states that she does not have problems swallowing, but that she does not want to eat.  O: BP (!) 142/59 (BP Location: Left Arm)   Pulse 79   Temp 98.2 F (36.8 C) (Axillary)   Resp 17   SpO2 98%    Physical Exam: General: 84 y.o. y.o. female in NAD, sitting with head of bed elevated Cardio: RRR no m/r/g Lungs: CTAB, no wheezing, no rhonchi, no crackles Abdomen: Soft, non-tender to palpation, positive bowel sounds Skin: warm and dry Extremities: No edema Neuro: able to swallow water without choking, water not kept in mouth, able to follow commands, CN II-XII grossly intact, 5/5 strength BLE, sensation intact BUE/BLE, A&Ox2, does not know the year, able to answer questions appropriately and remembers details about people at the [place]  A/P: - will keep NPO pending speech consult - mental status improved from exam this AM - continue to monitor - no evidence of aspiration on physical exam  Cleophas Dunker, DO 06/07/2018, 1:47 PM PGY-1, [place] Medicine Service pager [phone]

## 2018-06-08 DIAGNOSIS — E119 Type 2 diabetes mellitus without complications: Secondary | ICD-10-CM

## 2018-06-08 DIAGNOSIS — R4182 Altered mental status, unspecified: Secondary | ICD-10-CM

## 2018-06-08 LAB — CBC
HCT: 37 % (ref 36.0–46.0)
Hemoglobin: 11.1 g/dL — ABNORMAL LOW (ref 12.0–15.0)
MCH: 22.3 pg — ABNORMAL LOW (ref 26.0–34.0)
MCHC: 30 g/dL (ref 30.0–36.0)
MCV: 74.3 fL — ABNORMAL LOW (ref 78.0–100.0)
Platelets: 158 10*3/uL (ref 150–400)
RBC: 4.98 MIL/uL (ref 3.87–5.11)
RDW: 16.7 % — ABNORMAL HIGH (ref 11.5–15.5)
WBC: 5.2 10*3/uL (ref 4.0–10.5)

## 2018-06-08 LAB — BASIC METABOLIC PANEL
Anion gap: 12 (ref 5–15)
BUN: 11 mg/dL (ref 8–23)
CALCIUM: 10.8 mg/dL — AB (ref 8.9–10.3)
CO2: 20 mmol/L — ABNORMAL LOW (ref 22–32)
CREATININE: 0.77 mg/dL (ref 0.44–1.00)
Chloride: 107 mmol/L (ref 98–111)
Glucose, Bld: 86 mg/dL (ref 70–99)
Potassium: 3.5 mmol/L (ref 3.5–5.1)
Sodium: 139 mmol/L (ref 135–145)

## 2018-06-08 LAB — URINE CULTURE: Culture: <10000 — AB

## 2018-06-08 MED ORDER — LOSARTAN POTASSIUM 50 MG PO TABS
50.0000 mg | ORAL_TABLET | Freq: Every day | ORAL | Status: DC
  Administered 2018-06-08 – 2018-06-09 (×2): 50 mg via ORAL
  Filled 2018-06-08 (×2): qty 1

## 2018-06-08 MED ORDER — AMLODIPINE BESYLATE 5 MG PO TABS
5.0000 mg | ORAL_TABLET | Freq: Every day | ORAL | Status: DC
  Administered 2018-06-08 – 2018-06-09 (×2): 5 mg via ORAL
  Filled 2018-06-08 (×2): qty 1

## 2018-06-08 NOTE — Progress Notes (Signed)
Family Medicine Teaching Service Daily Progress Note Intern Pager: [phone]  [name] Medical record number: [id number] Date of birth: 04-22-1924 Age: 84 y.o. Gender: female  Primary Care Provider: [place] Bing, DO Consultants: None Code Status: DNR  Pt Overview and Major Events to Date:  7/25 Admitted for AMS  Assessment and Plan: [name] is a 84 yo female who presented with concerns for AMS.  Her PMHx is significant for recent discharge following TIA, T2DM, HTN, HFpEF, HLD, and GERD, as well as history of bilateral breast cancer and colon cancer.  AMS likely 2/2 UTI vs Dehydration:improving Urine culture during last admission grew E coli.  Uculture in ED this admission with insiginficant grwoth.  CT shows no acute bleed.  Her mental status appears reutrned to baseline from her previous admission.   - treat for UTI - Keflex 250mg  q6h x 7 days - cont to monitor  - monitor strict I/O  H/o TIA- no repeat neuro symptoms Recently discharged following TIA.  Alert.  On DAPT for three weeks, then will d/c Plavix and continue ASA 325mg ,CT shows no acute bleed. -cont ASA and plavix as no current concern for acute bleed - cont Neuro checks q4h  Urine findings- Urine culture 7/22 grew E coli, received one dose Rocephin in ED, but treatment not continued as patient was asymptomatic. Confusion has worsened since her discharge.  UA in ED on 7/25 positive for ketones, leukocytes, without nitrites. Patient had not urinated since her discharge on  7/24, also not drinking since then.  Received 2g Rocephin in ED on 7/25.  WBC 5.0 this AM.  Patient has remained afebrile, but room is very hot and patient states that she is cold. - will treat UTI due to recent change in mental status - Keflex 250mg  q6h x 7 days - f/u urine culture  Vomiting- resolved  Hypokalemia-resolved  HFpEF: chronic, stable Last EF 50-55% and G1DD.  On carvedilol.  Had been on losartan and amlodipine,  currently increasing to home dose losartan. - cont carvedilol  HTN: Chronic. 180s SBP overnight 7/28.  Losartan and Amlodipine held since last admission and to be added back as BP tolerates. - cont Coreg -increase Losartan to home 50mg  QD -restart amlodipine 5   T2DM: Well-controlled Last A1c 06/04/18 6.7.  On metformin 500mg . A1c goal per Neuro <7. - cont to hold metformin -CBG checks q4hrs -consider sSSI as indicated   HLD: Chronic On crestor 20mg  QD - cont home Crestor  GERD: Chronic On Ranitidine at home - famotidine PRN  HX B/L Breast cancer and Colon cancer: Chronic, stable - f/u outpatient  FEN/GI: Regular diet PPx: SCDs  Disposition: likely home when cleared  Subjective:  Pleasantly alert with no physical complaints, would like to go home now  Objective: Temp:  [97.4 F (36.3 C)-98.5 F (36.9 C)] 97.4 F (36.3 C) (07/27 0833) Pulse Rate:  [67-85] 85 (07/27 0833) Resp:  [17-20] 18 (07/27 0833) BP: (142-184)/(59-70) 184/70 (07/27 0833) SpO2:  [97 %-100 %] 99 % (07/27 5916)  Physical Exam: General: 84 y.o. y.o. female in NAD, sitting up and interacting at baseline Cardio: RRR, no murmur noted Lungs: no IWB, CTAB, no cough/wheeze, ate without choking Abdomensoft, no pain Extremities: no edema to feet, ~1+ edema to hip Neuro: Alert & oriented x2, doesn't know year, has returned to confidently identifying family practice staff, 5/5 strength in hands, no facial droop noted  Laboratory: Recent Labs  Lab 06/06/18 1942 06/07/18 0412 06/08/18  0416  WBC 5.3 5.0 5.2  HGB 11.3* 10.0* 11.1*  HCT 37.4 32.7* 37.0  PLT 173 160 158   Recent Labs  Lab 06/03/18 1817  06/06/18 1942 06/07/18 0412 06/08/18 0416  NA 140   < > 142 142 139  K 3.4*   < > 3.5 3.2* 3.5  CL 106   < > 109 109 107  CO2 24   < > 23 22 20*  BUN 9   < > 15 12 11   CREATININE 0.92   < > 1.00 0.80 0.77  CALCIUM 11.2*   < > 11.5* 10.5* 10.8*  PROT 7.7  --  7.9  --   --   BILITOT 0.5  --   0.8  --   --   ALKPHOS 59  --  48  --   --   ALT 10  --  11  --   --   AST 16  --  20  --   --   GLUCOSE 158*   < > 108* 95 86   < > = values in this interval not displayed.      Imaging/Diagnostic Tests: No results found.  Sherene Sires, DO 06/08/2018, 9:23 AM PGY-2, [place] Intern pager: [phone], text pages welcome

## 2018-06-09 LAB — BASIC METABOLIC PANEL
Anion gap: 10 (ref 5–15)
BUN: 11 mg/dL (ref 8–23)
CO2: 22 mmol/L (ref 22–32)
Calcium: 10.6 mg/dL — ABNORMAL HIGH (ref 8.9–10.3)
Chloride: 107 mmol/L (ref 98–111)
Creatinine, Ser: 0.85 mg/dL (ref 0.44–1.00)
GFR calc Af Amer: >60 mL/min (ref >60)
GFR calc non Af Amer: 58 mL/min — ABNORMAL LOW (ref >60)
Glucose, Bld: 137 mg/dL — ABNORMAL HIGH (ref 70–99)
Potassium: 3.1 mmol/L — ABNORMAL LOW (ref 3.5–5.1)
Sodium: 139 mmol/L (ref 135–145)

## 2018-06-09 MED ORDER — LOSARTAN POTASSIUM 50 MG PO TABS: 50.0000 mg | ORAL_TABLET | Freq: Every day | ORAL | 0 refills | Status: DC

## 2018-06-09 MED ORDER — AMLODIPINE BESYLATE 5 MG PO TABS: 5.0000 mg | ORAL_TABLET | Freq: Every day | ORAL | 0 refills | Status: DC

## 2018-06-09 MED ORDER — POTASSIUM CHLORIDE CRYS ER 20 MEQ PO TBCR
40.0000 meq | EXTENDED_RELEASE_TABLET | ORAL | Status: AC
  Administered 2018-06-09 (×2): 40 meq via ORAL
  Filled 2018-06-09 (×2): qty 2

## 2018-06-09 MED ORDER — CEPHALEXIN 250 MG PO CAPS: 250.0000 mg | ORAL_CAPSULE | Freq: Three times a day (TID) | ORAL | 0 refills | Status: AC

## 2018-06-09 NOTE — Evaluation (Signed)
Physical Therapy Evaluation Patient Details Name: [name] MRN: [id number] DOB: 1924-10-27 Today's Date: 06/09/2018   History of Present Illness  84 y.o. female with past medical history of chronic systolic heart failure, coronary artery disease, diabetes mellitus, hyperlipidemia, hypertension, neuropathy, colon cancer and breast cancer presents with AMS 2/2 UTI vs Deydration and hypokalemia. Pt was recently discharged in the past week and recieving HHPT.   Clinical Impression  Pt presents with the above diagnosis and deficits for PT evaluation. Pt was recently discharged home following diagnosis of TIA s/p <1 week ago and is now returning with AMS. Prior to admission, pt was receiving HHPT and SN and reports being able to perform short distance gait in her home. Gait was not assessed this session, but pt is able to perform bed mobs with increased time and stand pivot transfer from bed to recliner with Min guard to supervision. Pt will benefit from acute PT follow-up to address gait if still admitted and HHPT at discharge to continue with improving strength and functional gains,     Follow Up Recommendations Home health PT;Supervision/Assistance - 24 hour(reports having [facility] prior to admission)    Equipment Recommendations  None recommended by PT    Recommendations for Other Services       Precautions / Restrictions Precautions Precautions: Fall Restrictions Weight Bearing Restrictions: No      Mobility  Bed Mobility Overal bed mobility: Needs Assistance Bed Mobility: Supine to Sit     Supine to sit: Min guard     General bed mobility comments: min gaurd for safety, increased time   Transfers Overall transfer level: Needs assistance Equipment used: None Transfers: Sit to/from [facility] Sit to Stand: Min guard Stand pivot transfers: Min guard       General transfer comment: min guard for safety  from bed to commode then commode to chair. No  physical assist required. Increased time with significant flexed posture during transitional movement.  Ambulation/Gait                Stairs            Wheelchair Mobility    Modified Rankin (Stroke Patients Only)       Balance Overall balance assessment: History of Falls                                           Pertinent Vitals/Pain Pain Assessment: No/denies pain    Home Living Family/patient expects to be discharged to:: Private residence Living Arrangements: Children Available Help at Discharge: Family;Personal care attendant;Available PRN/intermittently Type of Home: House Home Access: Stairs to enter Entrance Stairs-Rails: Psychiatric nurse of Steps: 7 Home Layout: One level Home Equipment: Wheelchair - manual;Bedside commode Additional Comments: per chart review from previous admission and current reports from patient.  pt does not ambulate but rather stand pivot transfers    Prior Function Level of Independence: Needs assistance   Gait / Transfers Assistance Needed: uses a w/c for mobility, requires assistance with transfers, requires assistance with bed mobility  ADL's / Homemaking Assistance Needed: pt has an aide that comes every morning until noon 7 days a week (pt unsure of what time arrives) that assists her with bathing and dressing and toileting. pt is able to feed herself  Comments: Pt states her daughter will help her all the time.      Hand  Dominance   Dominant Hand: Right    Extremity/Trunk Assessment   Upper Extremity Assessment Upper Extremity Assessment: Overall WFL for tasks assessed    Lower Extremity Assessment Lower Extremity Assessment: Overall WFL for tasks assessed    Cervical / Trunk Assessment Cervical / Trunk Assessment: Kyphotic  Communication   Communication: HOH  Cognition Arousal/Alertness: Awake/alert Behavior During Therapy: WFL for tasks assessed/performed Overall  Cognitive Status: No family/caregiver present to determine baseline cognitive functioning                                        General Comments      Exercises     Assessment/Plan    PT Assessment Patient needs continued PT services  PT Problem List Decreased strength;Decreased activity tolerance;Decreased balance;Decreased mobility       PT Treatment Interventions DME instruction;Gait training;Functional mobility training;Therapeutic activities;Therapeutic exercise;Balance training    PT Goals (Current goals can be found in the Care Plan section)  Acute Rehab PT Goals Patient Stated Goal: to get back home PT Goal Formulation: With patient Time For Goal Achievement: 06/16/18 Potential to Achieve Goals: Good    Frequency Min 3X/week   Barriers to discharge        Co-evaluation               AM-PAC PT "6 Clicks" Daily Activity  Outcome Measure Difficulty turning over in bed (including adjusting bedclothes, sheets and blankets)?: A Little Difficulty moving from lying on back to sitting on the side of the bed? : A Little Difficulty sitting down on and standing up from a chair with arms (e.g., wheelchair, bedside commode, etc,.)?: A Little Help needed moving to and from a bed to chair (including a wheelchair)?: A Little Help needed walking in hospital room?: A Lot Help needed climbing 3-5 steps with a railing? : Total 6 Click Score: 15    End of Session Equipment Utilized During Treatment: Gait belt Activity Tolerance: Patient tolerated treatment well Patient left: in chair;with call bell/phone within reach;with chair alarm set;with nursing/sitter in room Nurse Communication: Mobility status PT Visit Diagnosis: Unsteadiness on feet (R26.81);History of falling (Z91.81);Other abnormalities of gait and mobility (R26.89);Muscle weakness (generalized) (M62.81)    Time: 1131-1150 PT Time Calculation (min) (ACUTE ONLY): 19 min   Charges:   PT  Evaluation $PT Eval Moderate Complexity: 1 Mod          Vicki Chaffin Mal Amabile PT, DPT     Dorisann Schwanke Sloan Leiter 06/09/2018, 11:57 AM

## 2018-06-09 NOTE — Progress Notes (Signed)
Family Medicine Teaching Service Daily Progress Note Intern Pager: [phone]  [name] Medical record number: [id number] Date of birth: 13-Dec-1923 Age: 84 y.o. Gender: female  Primary Care Provider: [place] Bing, DO Consultants: None Code Status: DNR  Pt Overview and Major Events to Date:  7/25 Admitted for AMS  Assessment and Plan: [name] is a 84 yo female who presented with concerns for AMS.  Her PMHx is significant for recent discharge following TIA, T2DM, HTN, HFpEF, HLD, and GERD, as well as history of bilateral breast cancer and colon cancer.  AMS likely 2/2 UTI vs Dehydration: Improving Urine culture during last admission grew E coli.  Uculture in ED this admission with insiginficant growth.  CT shows no acute bleed.  Mental status continues to be improved, at baseline per last admission.  - con tot treat for UTI - Keflex 250mg  q6h day 3/7 - cont to monitor  - monitor strict I/O - will speak with family regarding patient's baseline, possible d/c home today with 24hr supervision (lives with daughter [name])  H/o TIA: No repeat Neuro symptoms Recently discharged following TIA.  On DAPT for three weeks, then will d/c Plavix and continue ASA 325mg ,CT shows no acute bleed. Alert on exam. -cont ASA and plavix  Urine Findings Urine culture 7/22 grew E coli.  Urine culture this admission <10,000 colonies insignificant growth.  Afebrile.   - cont Keflex 250mg  q6h day 3/7  Vomiting- Resolved  Hypokalemia: K 3.1 K decreased this AM. - will give K-dur 26mEq x2 - f/u BMP as outpatient  HFpEF: Chronic, stable Last EF 50-55% and G1DD.  On carvedilol.   - cont carvedilol - cont losartan - cont amlodipine  HTN: Chronic.  BP this AM 153/73 - cont Coreg - cont home Losartan 50mg   -cont home amlodipine 5mg  - will need BP re-assessed by PCP as outpatient  T2DM: Well-controlled Last A1c 06/04/18 6.7.  On metformin 500mg . A1c goal per Neuro <7. - cont  to hold metformin -CBG checks q4hrs -consider sSSI as indicated   HLD: Chronic On crestor 20mg  QD - cont home Crestor  GERD: Chronic On Ranitidine at home - famotidine PRN  HX B/L Breast cancer and Colon cancer: Chronic, stable - f/u outpatient  FEN/GI: Regular diet PPx: SCDs  Disposition: likely home today  Subjective:  Patient denies any current complaints.  States that she is looking forward to going home.  Objective: Temp:  [97.6 F (36.4 C)-98.4 F (36.9 C)] 98.4 F (36.9 C) (07/28 0834) Pulse Rate:  [69-90] 90 (07/28 0834) Resp:  [18-20] 18 (07/28 0834) BP: (153-166)/(64-78) 153/75 (07/28 0834) SpO2:  [99 %-100 %] 100 % (07/28 0834)   Physical Exam: General: 84 y.o. y.o. female in NAD, sitting up in bed, pleasant Eyes: PERRL Cardio: RRR no m/r/g Lungs: CTAB, no wheezing, no rhonchi, no crackles Abdomen: Soft, non-tender to palpation, positive bowel sounds Skin: warm and dry Extremities: No edema Neuro: A&Ox2, does not know year, can identify family practice staff, appears at baseline mental status, 5/5 hand strength, 5/5 strength BLE, sensation intact, CN II-XII grossly intact, no facial droop   Laboratory: Recent Labs  Lab 06/06/18 1942 06/07/18 0412 06/08/18 0416  WBC 5.3 5.0 5.2  HGB 11.3* 10.0* 11.1*  HCT 37.4 32.7* 37.0  PLT 173 160 158   Recent Labs  Lab 06/03/18 1817  06/06/18 1942 06/07/18 0412 06/08/18 0416 06/09/18 0617  NA 140   < > 142 142 139 139  K  3.4*   < > 3.5 3.2* 3.5 3.1*  CL 106   < > 109 109 107 107  CO2 24   < > 23 22 20* 22  BUN 9   < > 15 12 11 11   CREATININE 0.92   < > 1.00 0.80 0.77 0.85  CALCIUM 11.2*   < > 11.5* 10.5* 10.8* 10.6*  PROT 7.7  --  7.9  --   --   --   BILITOT 0.5  --  0.8  --   --   --   ALKPHOS 59  --  48  --   --   --   ALT 10  --  11  --   --   --   AST 16  --  20  --   --   --   GLUCOSE 158*   < > 108* 95 86 137*   < > = values in this interval not displayed.      Imaging/Diagnostic  Tests: No results found.  [place], DO 06/09/2018, 10:24 AM PGY-1, [place] Intern pager: [phone], text pages welcome

## 2018-06-09 NOTE — Discharge Instructions (Signed)
Keep taking your antibiotics until your prescription runs out.

## 2018-06-09 NOTE — Progress Notes (Signed)
Nurse went over discharge with patient and family. Patient and family verbalized understanding of discharge. All questions and concerns addressed. Discharging home with all belongings. Taking down in a wheelchair. 

## 2018-06-09 NOTE — Discharge Summary (Signed)
[facility] Discharge Summary  [name] Medical record number: [id number] Date of birth: 03/08/1924 Age: 84 y.o. Gender: female Date of Admission: 06/06/2018  Date of Discharge: 06/09/2018 Admitting Physician: Zenia Resides, MD  Primary Care Provider: [place] Bing, DO Consultants: None  Indication for Hospitalization: Altered Mental Status  Discharge Diagnoses/Problem List:  AMS likely 2/2 UTI vs Dehydration H/o TIA Urine Findings Vomiting: Resolved Hypokalemia HFpEF HTN T2DM GERD Hx B/L Breast Cancer and Colon Cancer  Disposition: Home  Discharge Condition: Stable  Discharge Exam:  General: 84 y.o. y.o. female in NAD, sitting up in bed, pleasant Eyes: PERRL Cardio: RRR no m/r/g Lungs: CTAB, no wheezing, no rhonchi, no crackles Abdomen: Soft, non-tender to palpation, positive bowel sounds Skin: warm and dry Extremities: No edema Neuro: A&Ox2, does not know year, can identify family practice staff, appears at baseline mental status, 5/5 hand strength, 5/5 strength BLE, sensation intact, CN II-XII grossly intact, no facial droop    Brief Hospital Course:  [name] is a 84 yo female who presented with concerns for AMS. She had been discharged two days prior to her presentation following admission for a TIA.  At that time, she was noted to have a positive urine culture for E coli, but patient had denied urinary symptoms and her mental status had returned to her baseline prior to her discharge with resolution of her TIA symptoms.  Her family noted that she had not been eating or drinking since discharge and that she had been very confused.  In the ED, the patient was found to have AMS and was A&Ox1, to her self only.  She remained afebrile, but had chills. A CT in the ED ruled out an acute bleed. Her Neuro exam remained grossly intact without deficit.  She had not urinated since discharge and had been vomiting when she  attempted to eat.  The decision was made to treat the patient's known positive urine culture due to her new onset AMS without neurological evidence for TIA, CVA, or bleed.   The patient continued to improve clinically with IV fluids and treatment of her UTI on Keflex.  She did not vomit during her hospitalization.  Her neuro exams remained without deficit and her mentation returned to her baseline, A&Ox2, confirmed by family.  She remained afebrile with a normal white blood cell count throughout her admission.  The patient was stable, urinating well, tolerating a PO diet, denying complaints, and at baseline mental status at the time of discharge.  Her hospital course was also briefly complicated by minor asymptomatic hypokalemia that was correct with K-Dur. She will need repeat BMP as outpatient.  During her last hospitalization, the patient's amlodipine and losartan were held to allow for permissive HTN.  The plan was for the patient to follow up as an outpatient and add them back slowly.  During this admission, the patient's BP went as high as the 180s.  She was started ona decreased dose of Losartan at 25mg  and was increased to her home dose 50mg  as her BP tolerated.  Her home amlodipine 5mg  was also restarted.  On discharge, patient's BP was 153/73.  She will need further follow up of her BP as an outpatient.  Issues for Follow Up:  1. Will need follow up of blood pressure.  Patient on Coreg, Losartan, Amlodipine. 2. Will need follow up of hypokalemia.  Patient given K-dur 29mEq x2 prior to discharge. 3. Will need follow up  of UTI treatment, patient to continue Keflex for total of 7 days of therapy.  4. Will need follow up with Stroke clinic from TIA on prior admission. 5. Will need continuation of ASA 325mg  and Plavix x3 total weeks, then d/c Plavix and continue ASA 325mg  QD. 6. Will need follow up of mental status, family notes patient at her baseline on discharge.  Significant Procedures:  None  Significant Labs and Imaging:  Recent Labs  Lab 06/06/18 1942 06/07/18 0412 06/08/18 0416  WBC 5.3 5.0 5.2  HGB 11.3* 10.0* 11.1*  HCT 37.4 32.7* 37.0  PLT 173 160 158   Recent Labs  Lab 06/05/18 0456 06/06/18 1942 06/07/18 0412 06/08/18 0416 06/09/18 0617  NA 140 142 142 139 139  K 3.8 3.5 3.2* 3.5 3.1*  CL 110 109 109 107 107  CO2 23 23 22  20* 22  GLUCOSE 127* 108* 95 86 137*  BUN 8 15 12 11 11   CREATININE 0.96 1.00 0.80 0.77 0.85  CALCIUM 11.0* 11.5* 10.5* 10.8* 10.6*  ALKPHOS  --  48  --   --   --   AST  --  20  --   --   --   ALT  --  11  --   --   --   ALBUMIN  --  3.7  --   --   --     Urinalysis    Component Value Date/Time   COLORURINE YELLOW 06/06/2018 2145   APPEARANCEUR HAZY (A) 06/06/2018 2145   LABSPEC 1.014 06/06/2018 2145   PHURINE 6.0 06/06/2018 2145   GLUCOSEU NEGATIVE 06/06/2018 2145   HGBUR SMALL (A) 06/06/2018 2145   HGBUR negative 10/21/2009 1424   BILIRUBINUR NEGATIVE 06/06/2018 2145   BILIRUBINUR NEG 08/03/2015 1240   KETONESUR 5 (A) 06/06/2018 2145   PROTEINUR 100 (A) 06/06/2018 2145   UROBILINOGEN 0.2 09/16/2015 1613   NITRITE NEGATIVE 06/06/2018 2145   LEUKOCYTESUR TRACE (A) 06/06/2018 2145   Recent Results (from the past 240 hour(s))  Culture, Urine     Status: Abnormal   Collection Time: 06/03/18  7:00 PM  Result Value Ref Range Status   Specimen Description URINE, RANDOM  Final   Special Requests   Final    NONE Performed at [facility], [place] [address] [place], [place] [address]    Culture >=100,000 COLONIES/mL ESCHERICHIA COLI (A)  Final   Report Status 06/06/2018 FINAL  Final   Organism ID, Bacteria ESCHERICHIA COLI (A)  Final      Susceptibility   Escherichia coli - MIC*    AMPICILLIN 16 INTERMEDIATE Intermediate     CEFAZOLIN <=4 SENSITIVE Sensitive     CEFTRIAXONE <=1 SENSITIVE Sensitive     CIPROFLOXACIN >=4 RESISTANT Resistant     GENTAMICIN <=1 SENSITIVE Sensitive     IMIPENEM <=0.25 SENSITIVE  Sensitive     NITROFURANTOIN <=16 SENSITIVE Sensitive     TRIMETH/SULFA >=320 RESISTANT Resistant     AMPICILLIN/SULBACTAM <=2 SENSITIVE Sensitive     PIP/TAZO <=4 SENSITIVE Sensitive     Extended ESBL NEGATIVE Sensitive     * >=100,000 COLONIES/mL ESCHERICHIA COLI  Culture, Urine     Status: Abnormal   Collection Time: 06/06/18  9:45 PM  Result Value Ref Range Status   Specimen Description URINE, CATHETERIZED  Final   Special Requests NONE  Final   Culture (A)  Final    <10,000 COLONIES/mL INSIGNIFICANT GROWTH Performed at [facility], [place],  [place] [address]    Report Status 06/08/2018 FINAL  Final    Results/Tests Pending at Time of Discharge: None  Discharge Medications:  Allergies as of 06/09/2018      Reactions   Tape Other (See Comments)   Skin is very thin and will tear and bruise easily!!   Lisinopril Cough      Medication List    TAKE these medications   acetaminophen 500 MG tablet Commonly known as:  TYLENOL Take 500 mg by mouth every 6 (six) hours as needed for moderate pain.   albuterol 108 (90 Base) MCG/ACT inhaler Commonly known as:  PROVENTIL HFA;VENTOLIN HFA Inhale 2 puffs into the lungs every 6 (six) hours as needed. For shortness of breath What changed:    reasons to take this  additional instructions   amLODipine 5 MG tablet Commonly known as:  NORVASC Take 1 tablet (5 mg total) by mouth daily.   aspirin 325 MG tablet Take 1 tablet (325 mg total) by mouth daily.   capsaicin 0.025 % cream Commonly known as:  ZOSTRIX APPLY TO AFFECTED AREA TWICE A DAY Notes to patient:  Continue home schedule   carvedilol 12.5 MG tablet Commonly known as:  COREG TAKE 1 TABLET BY MOUTH TWICE A DAY WITH A MEAL What changed:  See the new instructions.   cephALEXin 250 MG capsule Commonly known as:  KEFLEX Take 1 capsule (250 mg total) by mouth every 8 (eight) hours for 5 days. Notes to patient:  Next dose due at bedtime    clopidogrel 75 MG tablet Commonly known as:  PLAVIX Take 1 tablet (75 mg total) by mouth daily for 21 days. Then stop.   feeding supplement (PRO-STAT SUGAR FREE 64) Liqd Take 30 mLs by mouth 2 (two) times daily with a meal. ensure   guaifenesin 100 MG/5ML syrup Commonly known as:  ROBITUSSIN Take 10 mLs (200 mg total) by mouth 3 (three) times daily as needed for cough.   hydroxypropyl methylcellulose / hypromellose 2.5 % ophthalmic solution Commonly known as:  ISOPTO TEARS / GONIOVISC Place 1 drop 3 (three) times daily into both eyes. Notes to patient:  Continue home schedule   loratadine 10 MG tablet Commonly known as:  CLARITIN TAKE 1/2 TABLET BY MOUTH DAILY What changed:    how much to take  how to take this  when to take this   losartan 50 MG tablet Commonly known as:  COZAAR Take 1 tablet (50 mg total) by mouth daily.   metFORMIN 500 MG tablet Commonly known as:  GLUCOPHAGE Take 1 tablet (500 mg total) by mouth daily with breakfast. Notes to patient:  Continue home schedule   pantoprazole 40 MG tablet Commonly known as:  PROTONIX Take 1 tablet (40 mg total) by mouth daily.   PATADAY 0.2 % Soln Generic drug:  Olopatadine HCl APPLY 1 DROP TO EYE DAILY.   petrolatum-hydrophilic-aloe vera ointment Apply topically 2 (two) times daily as needed for wound care.   ranitidine 150 MG tablet Commonly known as:  ZANTAC TAKE 1 TABLET BY MOUTH TWICE A DAY What changed:    how much to take  how to take this  when to take this   rosuvastatin 20 MG tablet Commonly known as:  CRESTOR TAKE 1 TABLET BY MOUTH EVERYDAY AT BEDTIME   senna-docusate 8.6-50 MG tablet Commonly known as:  Senokot-S Take 2 tablets by mouth at bedtime. What changed:    how much to take  when to take this  silver sulfADIAZINE 1 % cream Commonly known as:  SILVADENE Apply 1 application topically daily.   traMADol 50 MG tablet Commonly known as:  ULTRAM Take 1 tablet (50 mg total)  by mouth every 8 (eight) hours as needed for severe pain. What changed:  how much to take       Discharge Instructions: Please refer to Patient Instructions section of EMR for full details.  Patient was counseled important signs and symptoms that should prompt return to medical care, changes in medications, dietary instructions, activity restrictions, and follow up appointments.   Follow-Up Appointments: Follow-up Information    [place] Bing, DO. Go on 06/12/2018.   Specialty:  Family Medicine Why:  10:10am Contact information: [place] [address] [phone]        Garvin Fila, MD. Schedule an appointment as soon as possible for a visit in 5 week(s).   Specialties:  Neurology, Radiology Contact information: [address] [place] [address] [place], [place], DO 06/10/2018, 9:23 PM PGY-1, [place]

## 2018-06-10 ENCOUNTER — Ambulatory Visit: Payer: Self-pay

## 2018-06-10 ENCOUNTER — Other Ambulatory Visit: Payer: Self-pay

## 2018-06-10 NOTE — Telephone Encounter (Signed)
Thank you for your quick response [name].   [name], [place] [address], PGY-3

## 2018-06-10 NOTE — Patient Outreach (Addendum)
[facility] [facility]) Care Management  06/10/2018  DELSIE AMADOR Dec 13, 1923 [id number]  BSW attempted to contact patient's daughter, [name], regarding social work referral for ramp assistance.  Left voicemail message.  BSW will try again within four business days.  Unsuccessful outreach letter mailed   Addendum: BSW received return phone call from Ms. Aretha Parrot.  BSW is mailing contact information for [employer] and [employer] in order for her to submit a referral.  BSW was given permission by Ms. Aretha Parrot to submit referral to Vocational Rehabilitation; referral was submitted.  BSW will follow up next week to ensure receipt of resources.     Ronn Melena, BSW Social Worker [phone]

## 2018-06-12 ENCOUNTER — Ambulatory Visit: Payer: Self-pay

## 2018-06-12 ENCOUNTER — Inpatient Hospital Stay: Payer: Medicare Other | Admitting: Family Medicine

## 2018-06-12 ENCOUNTER — Telehealth: Payer: Self-pay | Admitting: Family Medicine

## 2018-06-12 NOTE — Telephone Encounter (Signed)
Attempted to contact patient without answer, left VM.  Advised patient to contact clinic to reschedule hospital follow-up after CVA.  [name], [place], PGY-3

## 2018-06-17 ENCOUNTER — Other Ambulatory Visit: Payer: Self-pay

## 2018-06-17 ENCOUNTER — Ambulatory Visit: Payer: Self-pay

## 2018-06-17 NOTE — Patient Outreach (Signed)
[place] [facility]) Care Management  06/17/2018  [name] 1924-11-05 [id number]   Follow up call to patient's daughter, [name], to ensure receipt of resources mailed last week and to determine if she has heard from Michel Harrow at [place] regarding the referral submitted.  BSW left voicemail message.  Will attempt to reach her again within four business days.   Ronn Melena, BSW Social Worker [phone]

## 2018-06-19 NOTE — Progress Notes (Signed)
Subjective   Patient ID: ELLISHA BANKSON    DOB: 12-Nov-1924, 84 y.o. female   MRN: [id number]  CC: "Hospital follow-up"  HPI: JASANI LENGEL is a 84 y.o. female who presents to clinic today for the following:  TIA: Patient was hospitalized between the dates 7/25-7/28/2019 for AMS with concerns for UTI versus TIA.  Patient is accompanied by daughter [name] who states she found patient lying in bed "with her amount twisted."  She also reports patient had left-sided facial drooping and slurring of speech prompting her to call 911.  She did have generalized weakness but without focal loss, change or hearing loss.  Patient was started on aspirin and Plavix with instructions to continue for 3 weeks followed by aspirin alone.  She was treated with Keflex for UTI though urine culture was negative for infection.  Patient and daughter agree that she is near baseline.  She ambulates with a walker and wheelchair and did not require physical therapy at discharge.  Recommendations were made for patient to follow-up with stroke clinic.  Hypokalemia: Patient with potassium of 3.1 during hospitalization.  She was discharged on K-Dur and has been taking the medication without difficulty.  She believes she has approximately 2 days worth of medication left.  She denies palpitations, feelings of syncope, chest pain or shortness of breath.  Hypotension: Patient was restarted on all home antihypertensives at discharge but was noted to have some hypotension.  She is here today to have this rechecked with possible consideration of discontinuing or decreasing these medications.  Patient denies history of falls since discharge.  ROS: see HPI for pertinent.  [place]: HTN, PAD, h/o vertebrobasilar artery syndrome, HFpEF, hiatal hernia, GERD, tobacco use disorder, h/o b/l-breast cancer, h/o colon cancer, severe protein-calorie malnutrition, gout, IDA.Surgical history tot knee, EGD for esophogeal tear, b/l mastectomy, cardiac cath  2007 severe 3-vessel disease. Family history heart disease.Smoking status reviewed. Medications reviewed.  Objective   BP 130/60   Pulse 81   Temp 97.9 F (36.6 C) (Oral)   Ht 4\' 11"  (1.499 m)   Wt 112 lb 6.4 oz (51 kg)   SpO2 97%   BMI 22.70 kg/m  Vitals and nursing note reviewed.  General: frail elderly woman in wheelchair accompanied by daughter, well nourished, well developed, NAD with non-toxic appearance HEENT: normocephalic, atraumatic, moist mucous membranes, PERRLA, EOMI Neck: supple, non-tender without lymphadenopathy Cardiovascular: regular rate and rhythm without murmurs, rubs, or gallops Lungs: clear to auscultation bilaterally with normal work of breathing Abdomen: soft, non-tender, non-distended, normoactive bowel sounds, no suprapubic tenderness Skin: warm, dry, no rashes or lesions, cap refill < 2 seconds Extremities: warm and well perfused, normal tone, no edema Neuro: CNII-XII intact, no facial droop, no dysarthria Psych: euthymic mood, congruent affect  Assessment & Plan   Hypokalemia Acute.  Incidental finding during hospitalization.  Currently on potassium supplements.  Kidney function remained stable upon discharge.  Asymptomatic. - Instructed patient did complete course of K-Dur - Checking BMET, magnesium level  Hypotension Acute.  This follows concerns for TIA.  She has on multiple antihypertensives.  No recent history of falls though frail and wheelchair/walker dependent.  BP appears to be below baseline at 130/60.  HR preserved to 81.  Has HFpEF without known atrial fibrillation or other arrhythmia. - Discussed risk and benefits with agreement to discontinue carvedilol 12.5 mg twice daily and continue losartan 50 mg daily and amlodipine 10 mg daily - RTC 1 month to recheck BP control  TIA (transient  ischemic attack) Acute.  Remains asymptomatic with preserved neuro exam.  Currently on DAPT with high intensity statin. - Ambulatory referral to  neurology - Instructed patient to continue aspirin 325 mg daily and Plavix 75 mg daily until 06/27/2018 at which point she will continue aspirin only - Continue Crestor 20 mg daily - Reviewed return precautions  Orders Placed This Encounter  Procedures  . Basic metabolic panel  . Magnesium  . Ambulatory referral to Neurology    Referral Priority:   Routine    Referral Type:   Consultation    Referral Reason:   Specialty Services Required    Requested Specialty:   Neurology    Number of Visits Requested:   1   No orders of the defined types were placed in this encounter.   [name], [place], PGY-3 06/20/2018, 1:38 PM

## 2018-06-20 ENCOUNTER — Ambulatory Visit (INDEPENDENT_AMBULATORY_CARE_PROVIDER_SITE_OTHER): Payer: Medicare Other | Admitting: Family Medicine

## 2018-06-20 ENCOUNTER — Encounter: Payer: Self-pay | Admitting: Family Medicine

## 2018-06-20 ENCOUNTER — Other Ambulatory Visit: Payer: Self-pay

## 2018-06-20 VITALS — BP 130/60 | HR 81 | Temp 97.9°F | Ht 59.0 in | Wt 112.4 lb

## 2018-06-20 DIAGNOSIS — E876 Hypokalemia: Secondary | ICD-10-CM | POA: Diagnosis not present

## 2018-06-20 DIAGNOSIS — I959 Hypotension, unspecified: Secondary | ICD-10-CM | POA: Diagnosis not present

## 2018-06-20 DIAGNOSIS — G459 Transient cerebral ischemic attack, unspecified: Secondary | ICD-10-CM

## 2018-06-20 NOTE — Assessment & Plan Note (Signed)
Acute.  Incidental finding during hospitalization.  Currently on potassium supplements.  Kidney function remained stable upon discharge.  Asymptomatic. - Instructed patient did complete course of K-Dur - Checking BMET, magnesium level

## 2018-06-20 NOTE — Patient Outreach (Signed)
[place] [facility]) Care Management  06/20/2018  GEORJEAN TOYA 13-Aug-1924 [id number]   Second follow up call to patient's daughter, [name], to ensure receipt of resources mailed last week and to determine if she has heard from Michel Harrow at [place] regarding the referral submitted.  BSW left voicemail message.  Will attempt to reach her again next week.  Ronn Melena, BSW Social Worker [phone]

## 2018-06-20 NOTE — Assessment & Plan Note (Signed)
Acute.  Remains asymptomatic with preserved neuro exam.  Currently on DAPT with high intensity statin. - Ambulatory referral to neurology - Instructed patient to continue aspirin 325 mg daily and Plavix 75 mg daily until 06/27/2018 at which point she will continue aspirin only - Continue Crestor 20 mg daily - Reviewed return precautions

## 2018-06-20 NOTE — Assessment & Plan Note (Addendum)
Acute.  This follows concerns for TIA.  She has on multiple antihypertensives.  No recent history of falls though frail and wheelchair/walker dependent.  BP appears to be below baseline at 130/60.  HR preserved to 81.  Has HFpEF without known atrial fibrillation or other arrhythmia. - Discussed risk and benefits with agreement to discontinue carvedilol 12.5 mg twice daily and continue losartan 50 mg daily and amlodipine 10 mg daily - RTC 1 month to recheck BP control

## 2018-06-20 NOTE — Patient Instructions (Addendum)
Thank you for coming in to see [place] today. Please see below to review our plan for today's visit.  1.  Continue taking the aspirin and Plavix daily and discontinue the Plavix on 06/27/2018.  You can continue taking aspirin after this.  You will be contacted to schedule an appointment with neurology. 2.  It did not appear that you had a urinary infection. 3.  I will check your potassium and kidney function and call you if there is any abnormalities, otherwise you should receive results in the mail. 4.  It is important that you stop using tobacco as this can increase your risk for strokes and TIAs. 5.  I would like you to stop your carvedilol today.  Can continue taking your amlodipine and losartan for blood pressure.  We will recheck your blood pressure in approximately 1 month.  Please call the clinic at [phone] if your symptoms worsen or you have any concerns. It was our pleasure to serve you.  [name], [place], PGY-3

## 2018-06-21 LAB — BASIC METABOLIC PANEL
BUN/Creatinine Ratio: 12 (ref 12–28)
BUN: 13 mg/dL (ref 10–36)
CALCIUM: 11.2 mg/dL — AB (ref 8.7–10.3)
CHLORIDE: 108 mmol/L — AB (ref 96–106)
CO2: 20 mmol/L (ref 20–29)
Creatinine, Ser: 1.11 mg/dL — ABNORMAL HIGH (ref 0.57–1.00)
GFR calc Af Amer: 50 mL/min/{1.73_m2} — ABNORMAL LOW (ref >59)
GFR calc non Af Amer: 43 mL/min/{1.73_m2} — ABNORMAL LOW (ref >59)
GLUCOSE: 178 mg/dL — AB (ref 65–99)
Potassium: 4.3 mmol/L (ref 3.5–5.2)
Sodium: 143 mmol/L (ref 134–144)

## 2018-06-21 LAB — MAGNESIUM: MAGNESIUM: 2 mg/dL (ref 1.6–2.3)

## 2018-06-21 NOTE — Patient Outreach (Signed)
Encounter opened in error

## 2018-06-23 ENCOUNTER — Other Ambulatory Visit: Payer: Self-pay | Admitting: Family Medicine

## 2018-06-24 ENCOUNTER — Encounter: Payer: Self-pay | Admitting: Family Medicine

## 2018-06-24 NOTE — Telephone Encounter (Signed)
Patient instructed to discontinue Plavix following 06/27/2018 per neurology recommendations for TIA.  [name], [place], PGY-3

## 2018-06-25 DIAGNOSIS — I509 Heart failure, unspecified: Secondary | ICD-10-CM | POA: Diagnosis not present

## 2018-06-25 DIAGNOSIS — E119 Type 2 diabetes mellitus without complications: Secondary | ICD-10-CM | POA: Diagnosis not present

## 2018-06-25 DIAGNOSIS — F0390 Unspecified dementia without behavioral disturbance: Secondary | ICD-10-CM | POA: Diagnosis not present

## 2018-06-25 DIAGNOSIS — I10 Essential (primary) hypertension: Secondary | ICD-10-CM | POA: Diagnosis not present

## 2018-06-26 ENCOUNTER — Encounter: Payer: Self-pay | Admitting: Neurology

## 2018-06-26 ENCOUNTER — Ambulatory Visit: Payer: Self-pay

## 2018-06-27 ENCOUNTER — Other Ambulatory Visit: Payer: Self-pay

## 2018-06-27 ENCOUNTER — Other Ambulatory Visit: Payer: Self-pay | Admitting: Family Medicine

## 2018-06-27 NOTE — Patient Outreach (Addendum)
[facility]) Care Management  06/27/2018  [name] 01-04-1924 [id number]   BSW received communication from [name] at [place] that she is awaiting paperwork to be returned to process referral for ramp.  BSW made third attempt to reach patient's daughter, [name], regarding referral and to ensure receipt of other ramp resources mailed.  Left voicemail message on mobile number.  No answer or option to leave voicemail message on home number.  BSW will close case by the end of the week if no return call.  Addendum: BSW received return call from Ms. [name] who reported that she has not received ramp resources that were mailed.  BSW will resend information for [employer] and [employer].   Ms. [name] reported that she was supposed to receive paperwork from [place] but she has not.  BSW agreed to communicate this to [facility].  BSW will follow up again next week to ensure receipt of resources.    Ronn Melena, BSW Social Worker [phone]

## 2018-07-04 ENCOUNTER — Ambulatory Visit: Payer: Self-pay

## 2018-07-05 ENCOUNTER — Other Ambulatory Visit: Payer: Self-pay

## 2018-07-05 MED ORDER — ASPIRIN EC 325 MG PO TBEC: 325.0000 mg | DELAYED_RELEASE_TABLET | Freq: Every day | ORAL | 3 refills | Status: DC

## 2018-07-08 ENCOUNTER — Other Ambulatory Visit: Payer: Self-pay | Admitting: Family Medicine

## 2018-07-08 NOTE — Telephone Encounter (Signed)
Patient is to not take this for 1 month until seen by me on 07/23/2018.  [name], [place], PGY-3

## 2018-07-09 ENCOUNTER — Ambulatory Visit: Payer: Self-pay

## 2018-07-09 ENCOUNTER — Other Ambulatory Visit: Payer: Self-pay

## 2018-07-09 NOTE — Patient Outreach (Signed)
[facility]) Care Management  07/09/2018  [name] 04-23-1924 [id number]   Follow up call to patient's daughter, [name], regarding referral for assistance with ramp resources.  [name] confirmed that she received paperwork from [place] and has completed it and sent it back.  She also confirmed receipt of contact information for [employer] and [employer].  BSW encouraged her to submit referrals to both of these places in addition to [place].  BSW is closing case at this time due to no other social work needs being identified but encouarged [name] to call if additional needs arise.  Ronn Melena, BSW Social Worker [phone]

## 2018-07-11 ENCOUNTER — Ambulatory Visit: Payer: Self-pay

## 2018-07-18 ENCOUNTER — Ambulatory Visit (INDEPENDENT_AMBULATORY_CARE_PROVIDER_SITE_OTHER): Payer: Medicare Other | Admitting: Podiatry

## 2018-07-18 ENCOUNTER — Encounter: Payer: Self-pay | Admitting: Podiatry

## 2018-07-18 DIAGNOSIS — B351 Tinea unguium: Secondary | ICD-10-CM | POA: Diagnosis not present

## 2018-07-18 DIAGNOSIS — M79675 Pain in left toe(s): Secondary | ICD-10-CM | POA: Diagnosis not present

## 2018-07-18 DIAGNOSIS — L97401 Non-pressure chronic ulcer of unspecified heel and midfoot limited to breakdown of skin: Secondary | ICD-10-CM | POA: Diagnosis not present

## 2018-07-18 DIAGNOSIS — M79674 Pain in right toe(s): Secondary | ICD-10-CM | POA: Diagnosis not present

## 2018-07-19 ENCOUNTER — Ambulatory Visit: Payer: Medicare Other | Admitting: Podiatry

## 2018-07-19 DIAGNOSIS — E119 Type 2 diabetes mellitus without complications: Secondary | ICD-10-CM | POA: Diagnosis not present

## 2018-07-19 DIAGNOSIS — Z7984 Long term (current) use of oral hypoglycemic drugs: Secondary | ICD-10-CM | POA: Diagnosis not present

## 2018-07-19 DIAGNOSIS — Z961 Presence of intraocular lens: Secondary | ICD-10-CM | POA: Diagnosis not present

## 2018-07-19 DIAGNOSIS — H353133 Nonexudative age-related macular degeneration, bilateral, advanced atrophic without subfoveal involvement: Secondary | ICD-10-CM | POA: Diagnosis not present

## 2018-07-21 NOTE — Progress Notes (Signed)
Subjective:   Patient ID: [name], female   DOB: 84 y.o.   MRN: [id number]   HPI Patient states she seems to be healing well left and she needs to get her nails taken care of but so far it seems that the ulcer on her left heel is not gotten worse   ROS      Objective:  Physical Exam  Presents with caregiver with thick yellow brittle nailbeds 1-5 both feet and had a history of breakdown of tissue left heel lateral side that we have been working on     Assessment:  Mycotic nail infection 1-5 both feet localized ulceration left heel which appears to be healing     Plan:  H&P condition reviewed and debrided nailbeds 1-5 both feet with no iatrogenic bleeding and carefully debrided the lateral area and I did not note current breakdown discussion with satisfactory healing of ulceration

## 2018-07-23 ENCOUNTER — Other Ambulatory Visit: Payer: Self-pay

## 2018-07-23 ENCOUNTER — Encounter: Payer: Self-pay | Admitting: Family Medicine

## 2018-07-23 ENCOUNTER — Ambulatory Visit (INDEPENDENT_AMBULATORY_CARE_PROVIDER_SITE_OTHER): Payer: Medicare Other | Admitting: Family Medicine

## 2018-07-23 VITALS — BP 140/68 | HR 90 | Temp 99.0°F | Ht 59.0 in | Wt 113.6 lb

## 2018-07-23 DIAGNOSIS — Z23 Encounter for immunization: Secondary | ICD-10-CM | POA: Diagnosis not present

## 2018-07-23 DIAGNOSIS — E1140 Type 2 diabetes mellitus with diabetic neuropathy, unspecified: Secondary | ICD-10-CM | POA: Diagnosis not present

## 2018-07-23 DIAGNOSIS — I10 Essential (primary) hypertension: Secondary | ICD-10-CM | POA: Diagnosis not present

## 2018-07-23 DIAGNOSIS — I952 Hypotension due to drugs: Secondary | ICD-10-CM | POA: Diagnosis not present

## 2018-07-23 MED ORDER — LOSARTAN POTASSIUM 50 MG PO TABS: 50.0000 mg | ORAL_TABLET | Freq: Every day | ORAL | 3 refills | Status: DC

## 2018-07-23 MED ORDER — CARVEDILOL 12.5 MG PO TABS: ORAL_TABLET | ORAL | 3 refills | Status: DC

## 2018-07-23 MED ORDER — AMLODIPINE BESYLATE 5 MG PO TABS: 5.0000 mg | ORAL_TABLET | Freq: Every day | ORAL | 3 refills | Status: DC

## 2018-07-23 NOTE — Progress Notes (Signed)
Subjective   Patient ID: [name]    DOB: 16-Jan-1924, 84 y.o. female   MRN: [id number]  CC: "Hypotension"  HPI: [name] is a 84 y.o. female who presents to clinic today for the following:  Hypotension: Patient is here today for follow-up regarding recent episodes of hypotension for the past few months since discharge for TIA back in July.  This was thought to be secondary to medication induced hypotension prompting discontinuation of losartan.  During her last office visit back in August, BP appeared to be below baseline at 130/60.  Decision was made to decrease dose of carvedilol to 12.5 mg twice daily.  Patient reports taking the lower dose carvedilol along with the amlodipine daily but has not been taking the losartan.  She denies shortness of breath, chest pain, fatigue, syncope, palpitations.  Hypertension:  Patient has long-standing hypertension which has more recently been to controlled based on readings.  She has been holding her losartan as stated above.  She is a current everyday tobacco user with chewed tobacco.  She was recently seen by her ophthalmologist, Dr. Gershon Crane for her annual eye exam.  Vision was made to refer for a retina specialist, Dr. Baird Cancer with [facility].  She denies any changes to her vision including double vision or loss of vision.  Diabetes:  Patient has long-standing diabetes well controlled with use of metformin.  Last A1c was 6.7 approximately 1 month ago.  She does not check her CBGs.  She has received her annual diabetic eye exam as stated above.  She is due for her annual diabetic foot exam and flu shot.  ROS: see HPI for pertinent.  Orting: HTN, PAD, h/o vertebrobasilar artery syndrome, HFpEF, hiatal hernia, GERD, tobacco use disorder, h/o b/l-breast cancer, h/o colon cancer, severe protein-calorie malnutrition, gout, IDA.Surgical history tot knee, EGD for esophogeal tear, b/l mastectomy, cardiac cath 2007 severe 3-vessel disease.  Family history heart disease. Smoking status reviewed. Medications reviewed.  Objective   BP 140/68   Pulse 90   Temp 99 F (37.2 C) (Oral)   Ht 4\' 11"  (1.499 m)   Wt 113 lb 9.6 oz (51.5 kg)   SpO2 98%   BMI 22.94 kg/m  Vitals and nursing note reviewed.  General: frail elderly woman in wheelchair, well nourished, well developed, NAD with non-toxic appearance HEENT: normocephalic, atraumatic, moist mucous membranes, PERRLA, EOMI Neck: supple, non-tender without lymphadenopathy Cardiovascular: regular rate and rhythm without murmurs, rubs, or gallops Lungs: clear to auscultation bilaterally with normal work of breathing Abdomen: soft, non-tender, non-distended, normoactive bowel sounds Skin: warm, dry, no rashes or lesions, cap refill < 2 seconds Extremities: warm and well perfused, normal tone, no edema Psych: euthymic mood, congruent affect  Assessment & Plan   Hypotension Resolved.  Patient now appears to be hypertensive and uncontrolled.  Was likely related to medication induced hypotension. - See plan for hypertension  Primary hypertension Chronic.  Uncontrolled.  Currently on decreased dose of carvedilol and amlodipine.  Adding losartan. - Plan to continue carvedilol 12.5 mg twice daily and amlodipine 5 mg daily and restart losartan 2 mg daily - Checking BMET for kidney function and potassium level - RTC 1 month with plans to have patient and daughter check blood pressure at pharmacy once per week and bring results to next appointment along with all of her medications  Controlled type 2 diabetes mellitus with diabetic neuropathy, without long-term current use of insulin ([place]) Chronic.  Well-controlled.  Currently on metformin  only.  Patient has history of glaucoma with plans to see a retinal specialist. - Plan to continue metformin 500 mg twice daily - We will obtain ROI for recent ophthalmology appointment - Administered annual flu shot - Annual diabetic foot exam  performed  Orders Placed This Encounter  Procedures  . Flu Vaccine QUAD 36+ mos IM  . Basic metabolic panel   Meds ordered this encounter  Medications  . losartan (COZAAR) 50 MG tablet    Sig: Take 1 tablet (50 mg total) by mouth daily.    Dispense:  90 tablet    Refill:  3  . carvedilol (COREG) 12.5 MG tablet    Sig: TAKE 1 TABLET BY MOUTH TWICE A DAY WITH A MEAL    Dispense:  90 tablet    Refill:  3  . amLODipine (NORVASC) 5 MG tablet    Sig: Take 1 tablet (5 mg total) by mouth daily.    Dispense:  90 tablet    Refill:  Lynn, [place], PGY-3 07/23/2018, 5:13 PM

## 2018-07-23 NOTE — Assessment & Plan Note (Signed)
Resolved.  Patient now appears to be hypertensive and uncontrolled.  Was likely related to medication induced hypotension. - See plan for hypertension

## 2018-07-23 NOTE — Patient Instructions (Signed)
Thank you for coming in to see [place] today. Please see below to review our plan for today's visit.  1.  It appears that your low blood pressure is no longer an issue.  You do have signs that your blood pressure is not well controlled.  May be because you are not taking the losartan.  I have refilled this medication along with the amlodipine and the carvedilol.  These take all 3 of these medications as prescribed.  I would like to see you again in 1 month to make sure that your blood pressure is appropriate.  We can make adjustments if your blood pressure drops again.  I would encourage you to record your blood pressures 1-2 times per week and bring them to your next visit along with all your medications. 2.  I do believe the tramadol and the gabapentin are not beneficial at this point.  We will discontinue these medications as they increased risk of falls and other side effects. 3.  Follow-up with your retina specialist as we discussed.  Have them fax over the visit to our clinic.  Please call the clinic at [phone] if your symptoms worsen or you have any concerns. It was our pleasure to serve you.  [name], [place], PGY-3

## 2018-07-23 NOTE — Assessment & Plan Note (Signed)
Chronic.  Well-controlled.  Currently on metformin only.  Patient has history of glaucoma with plans to see a retinal specialist. - Plan to continue metformin 500 mg twice daily - We will obtain ROI for recent ophthalmology appointment - Administered annual flu shot - Annual diabetic foot exam performed

## 2018-07-23 NOTE — Assessment & Plan Note (Signed)
Chronic.  Uncontrolled.  Currently on decreased dose of carvedilol and amlodipine.  Adding losartan. - Plan to continue carvedilol 12.5 mg twice daily and amlodipine 5 mg daily and restart losartan 2 mg daily - Checking BMET for kidney function and potassium level - RTC 1 month with plans to have patient and daughter check blood pressure at pharmacy once per week and bring results to next appointment along with all of her medications

## 2018-07-24 ENCOUNTER — Other Ambulatory Visit: Payer: Self-pay | Admitting: Family Medicine

## 2018-07-24 ENCOUNTER — Encounter: Payer: Self-pay | Admitting: Family Medicine

## 2018-07-24 LAB — BASIC METABOLIC PANEL
BUN / CREAT RATIO: 10 — AB (ref 12–28)
BUN: 9 mg/dL — ABNORMAL LOW (ref 10–36)
CO2: 18 mmol/L — ABNORMAL LOW (ref 20–29)
CREATININE: 0.93 mg/dL (ref 0.57–1.00)
Calcium: 10.9 mg/dL — ABNORMAL HIGH (ref 8.7–10.3)
Chloride: 103 mmol/L (ref 96–106)
GFR calc Af Amer: 62 mL/min/{1.73_m2} (ref >59)
GFR, EST NON AFRICAN AMERICAN: 54 mL/min/{1.73_m2} — AB (ref >59)
Glucose: 307 mg/dL — ABNORMAL HIGH (ref 65–99)
Potassium: 3.1 mmol/L — ABNORMAL LOW (ref 3.5–5.2)
Sodium: 141 mmol/L (ref 134–144)

## 2018-07-25 IMAGING — CT CT HEAD CODE STROKE
4 series · 16 of 47 positions shown, 18 images · non-contrast
Comparison: CT HEAD November 19, 2017 and MRI head November 25, 2015.

CLINICAL DATA: Code stroke. Altered mental status, facial droop,
vomiting. History of RIGHT ICA aneurysm, breast cancer, colon
cancer, hypertension, hyperlipidemia.

EXAM:
CT HEAD WITHOUT CONTRAST
TECHNIQUE: Contiguous axial images were obtained from the base of the skull
through the vertex without intravenous contrast.

[Series 3: head wo · axial · 0.42mm/px · z∈[-116,-6]mm · 6 of 32 slices shown, 8 images]
[im 5/32  brain]
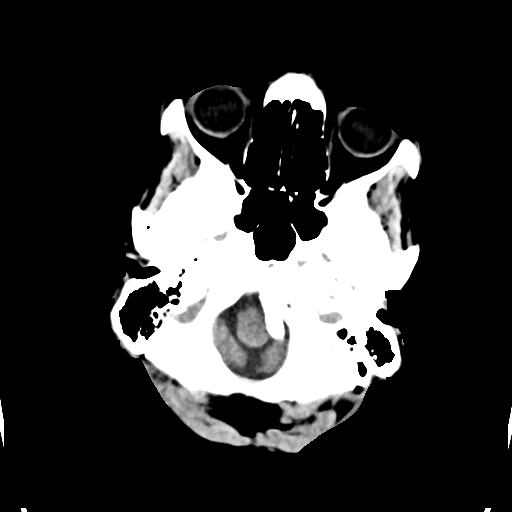
[im 5/32  bone]
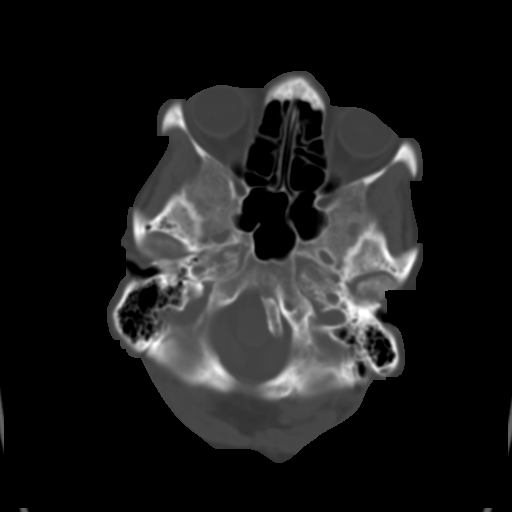
[im 9/32  brain]
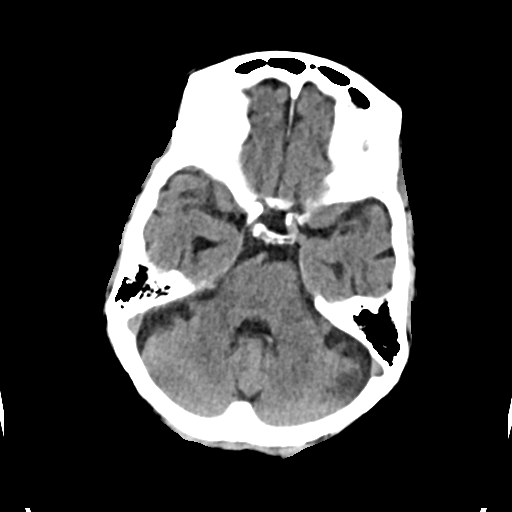
[im 14/32  brain]
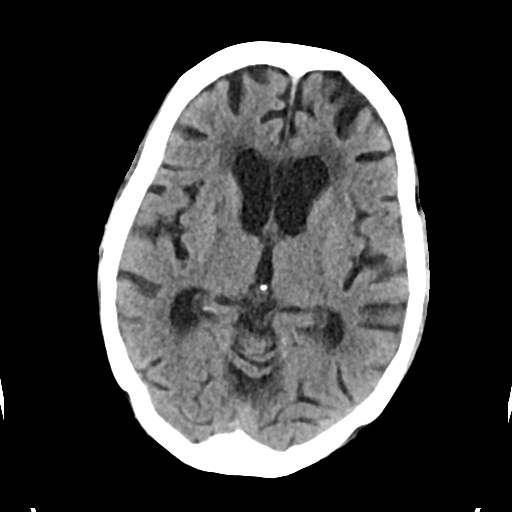
[im 18/32  brain]
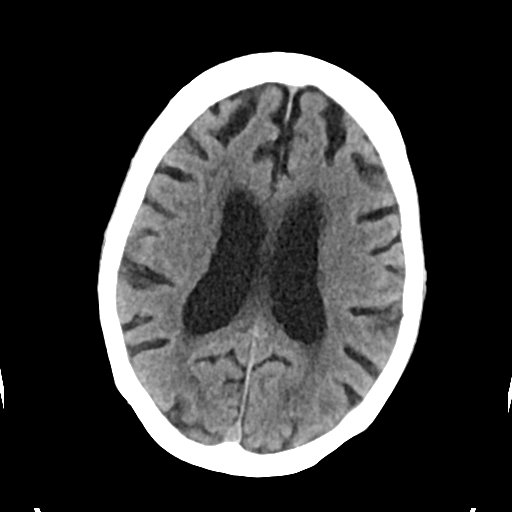
[im 23/32  brain]
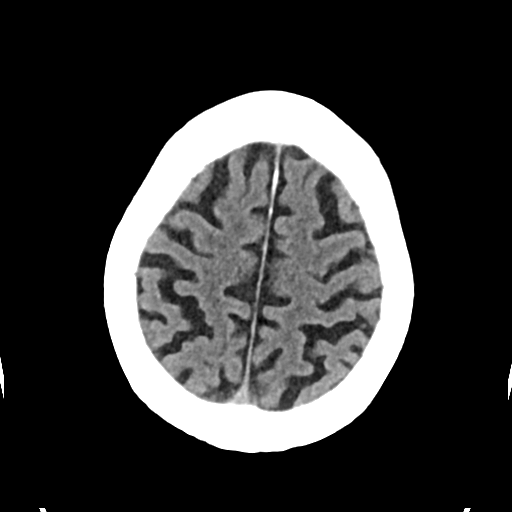
[im 23/32  bone]
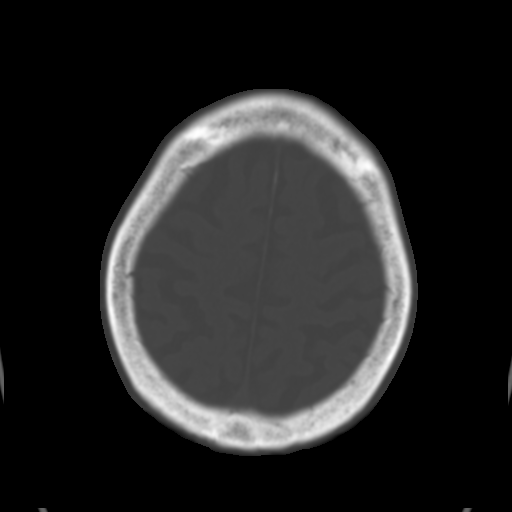
[im 27/32  brain]
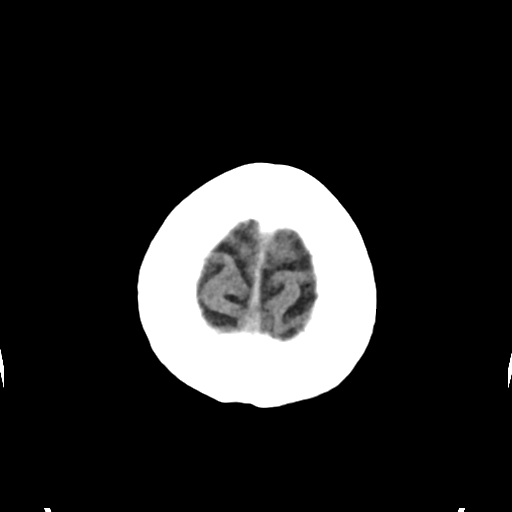

[Series 4: head bone · axial · 0.42mm/px · z∈[-122,-68]mm · 4 of 82 slices shown]
[im 8/82  bone]
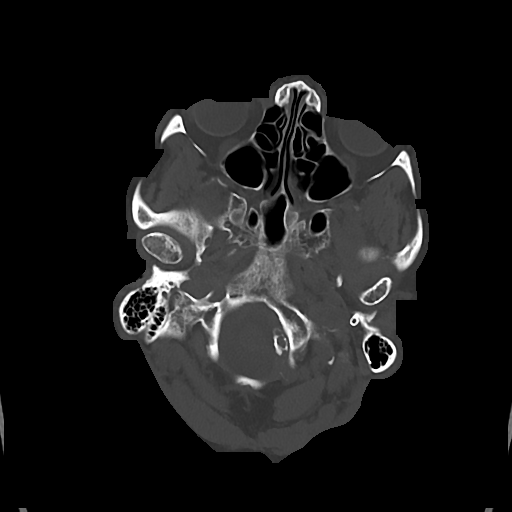
[im 16/82  bone]
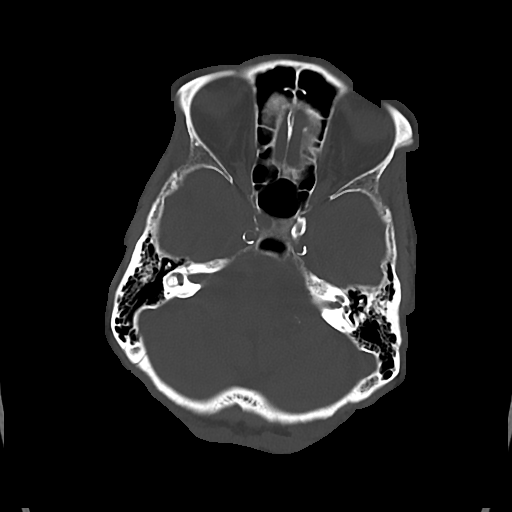
[im 28/82  bone]
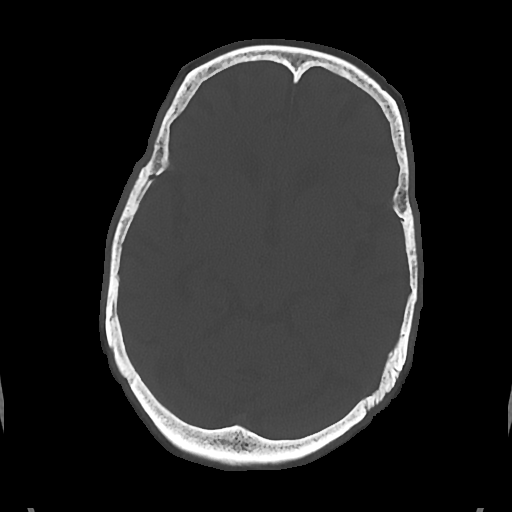
[im 35/82  bone]
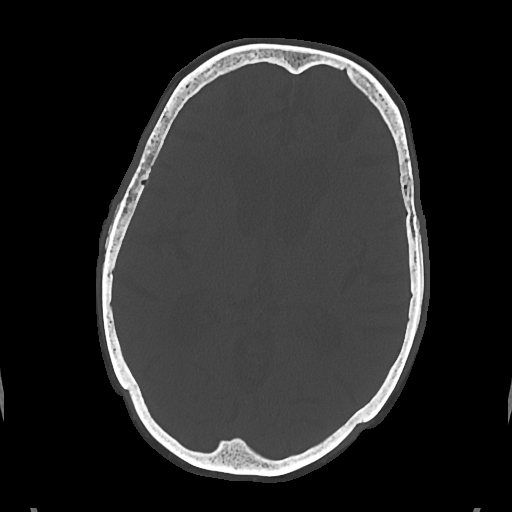

[Series 5: cor soft · coronal · 0.33mm/px · 3 of 69 slices shown]
[im 23/69  brain]
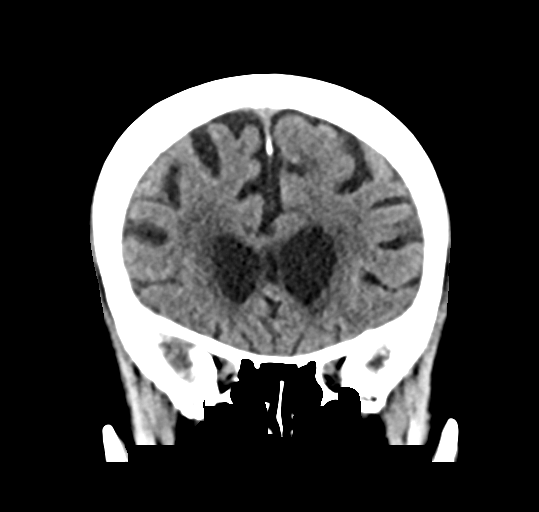
[im 31/69  brain]
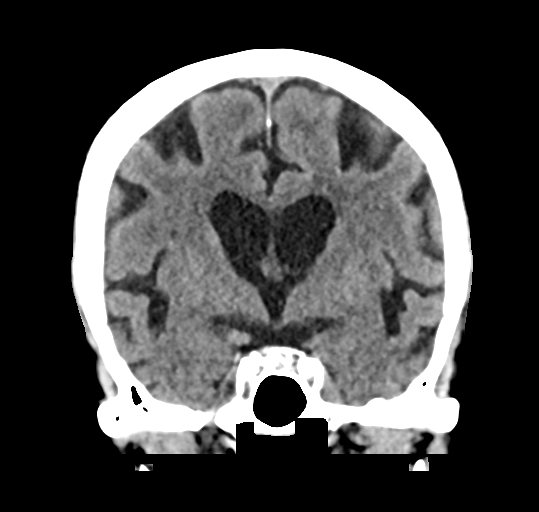
[im 38/69  brain]
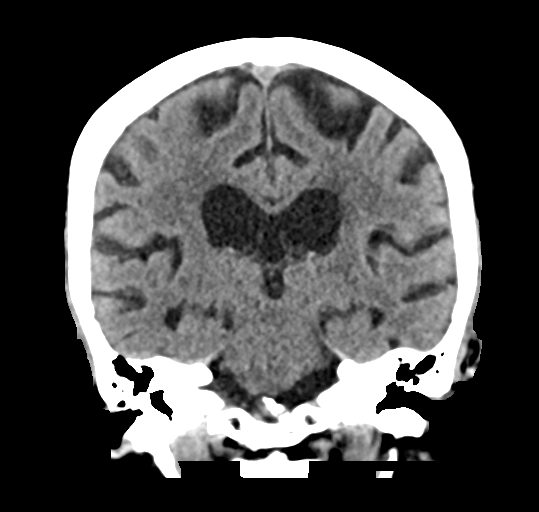

[Series 6: sag soft · sagittal · 0.32mm/px · 3 of 59 slices shown]
[im 20/59  brain]
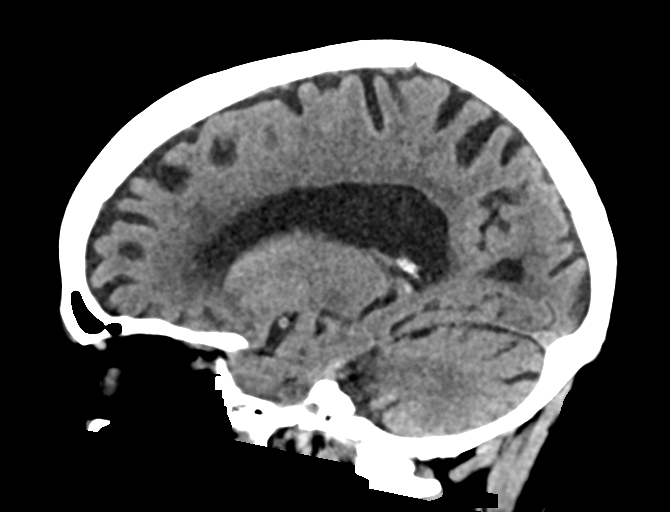
[im 30/59  brain]
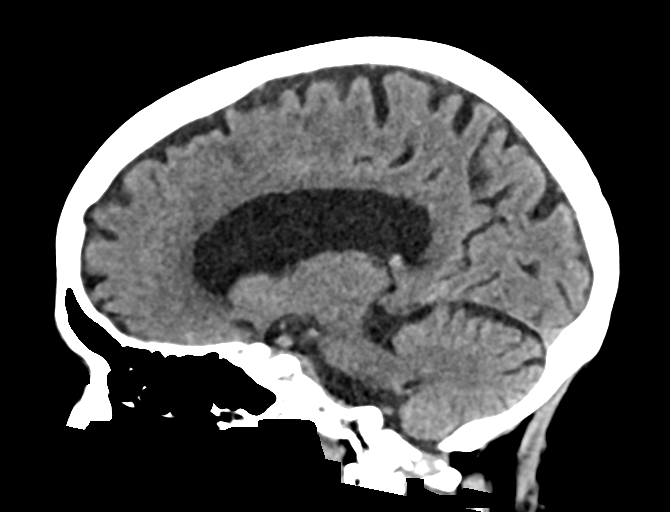
[im 39/59  brain]
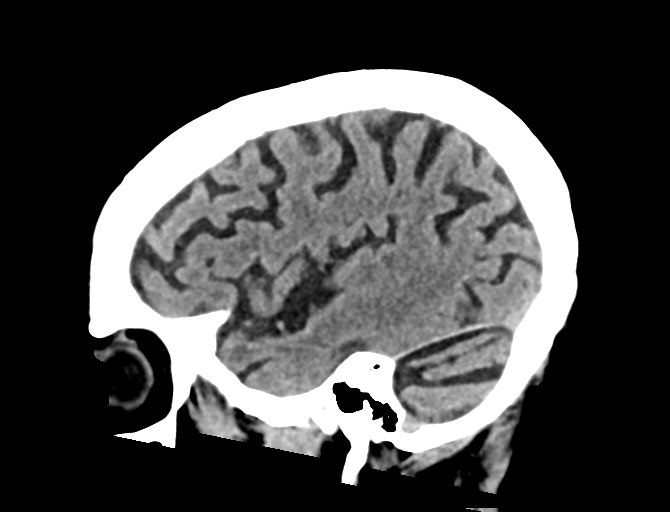

[16 of 47 positions shown; findings below may reference images not displayed]

FINDINGS: BRAIN: No intraparenchymal hemorrhage, mass effect nor midline
shift. The ventricles and sulci are normal for age. Patchy
supratentorial white matter hypodensities within normal range for
patient's age, though non-specific are most compatible with chronic
small vessel ischemic disease. Old RIGHT basal ganglia lacunar
infarcts. Focal vermian atrophy. Old small LEFT cerebellar infarct.
No acute large vascular territory infarcts. No abnormal extra-axial
fluid collections. Basal cisterns are patent.

VASCULAR: Severe calcific atherosclerosis of the carotid siphons.

SKULL: No skull fracture. No significant scalp soft tissue swelling.

SINUSES/ORBITS: Frothy secretions bilateral sphenoid sinuses.The
included ocular globes and orbital contents are non-suspicious.
Status post bilateral ocular lens implants.

OTHER: None.

ASPECTS (Alberta Stroke Program Early CT Score)

- Ganglionic level infarction (caudate, lentiform nuclei, internal
capsule, insula, M1-M3 cortex): 7

- Supraganglionic infarction (M4-M6 cortex): 3

Total score (0-10 with 10 being normal): 10
IMPRESSION: 1. No acute intracranial process.
2. ASPECTS is 10.
3. Stable examination including moderate chronic small vessel
ischemic changes and severe atherosclerosis.
4. Critical Value/emergent results were called by telephone at the
time of interpretation on 06/03/2018 at [DATE] to Dr. JISEOK BILLAL
, who verbally acknowledged these results.

## 2018-07-26 IMAGING — MR MR MRA NECK WO/W CM
6 of 7 series · 38 of 48 positions shown · IV contrast (multihance)
Comparison: None.

CLINICAL DATA: Altered mental status. Assess TIA. History of breast
and lung cancer, RIGHT ICA aneurysm, hypertension, hyperlipidemia,
diabetes.

EXAM:
MRI HEAD WITHOUT CONTRAST
MRA HEAD WITHOUT CONTRAST
MRA NECK WITHOUT AND WITH CONTRAST
TECHNIQUE: Multiplanar, multiecho pulse sequences of the brain and surrounding
structures were obtained without intravenous contrast. Angiographic
images of the Circle of Willis were obtained using MRA technique
without intravenous contrast. Angiographic images of the neck were
obtained using MRA technique without and with intravenous contrast.
Carotid stenosis measurements (when applicable) are obtained
utilizing NASCET criteria, using the distal internal carotid
diameter as the denominator.
CONTRAST:  11mL MULTIHANCE GADOBENATE DIMEGLUMINE 529 MG/ML IV SOLN

[Series 5: T2 · axial · 5.0mm · 0.75mm/px · z∈[-65,+73]mm · 5 of 25 slices shown (1 of 2)]
[im 1/25]
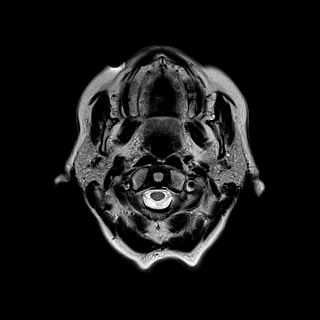
[im 7/25]
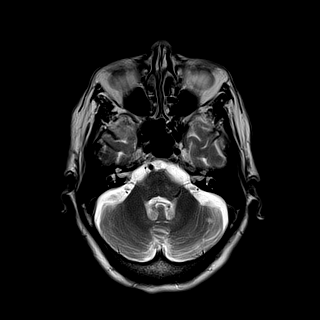
[im 13/25]
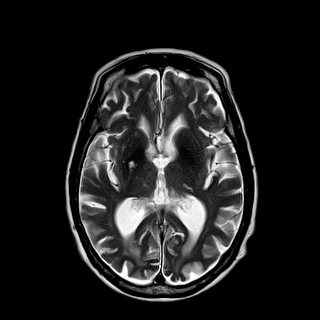
[im 19/25]
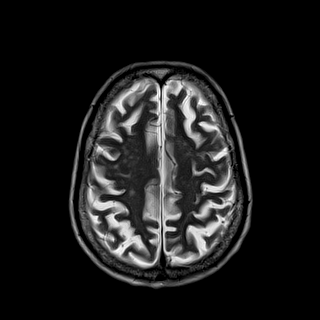
[im 25/25]
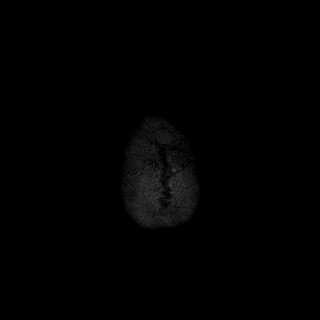

[Series 6: swi_images · axial · 3.0mm · 0.94mm/px · z∈[-69,+66]mm · 10 of 48 slices shown]
[im 1/48]
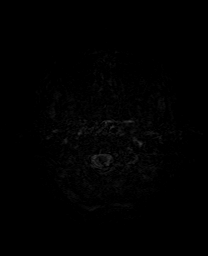
[im 6/48]
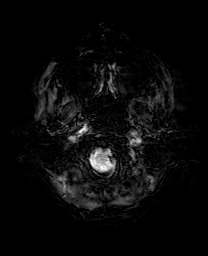
[im 11/48]
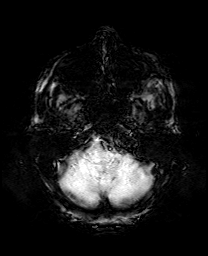
[im 16/48]
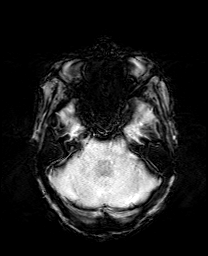
[im 21/48]
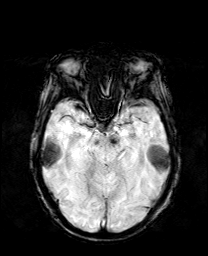
[im 27/48]
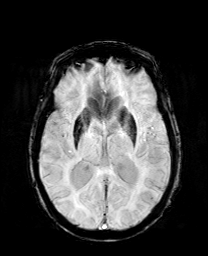
[im 32/48]
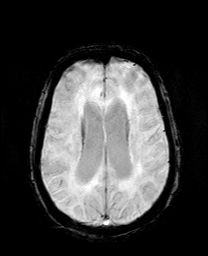
[im 37/48]
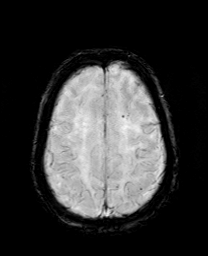
[im 42/48]
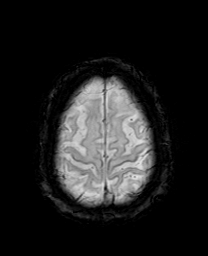
[im 48/48]
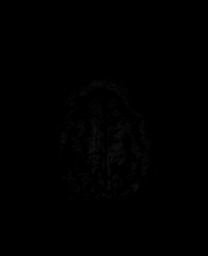

[Series 7: mip_images(sw) · axial · 24.0mm · 0.94mm/px · z∈[-59,+56]mm · 8 of 41 slices shown]
[im 1/41]
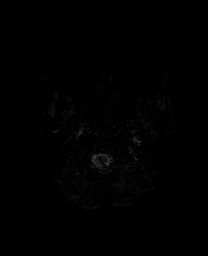
[im 6/41]
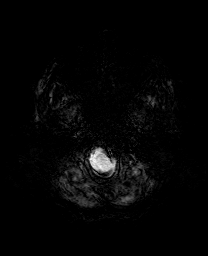
[im 12/41]
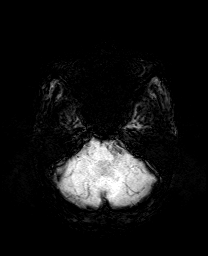
[im 18/41]
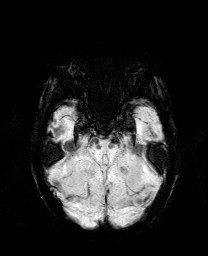
[im 23/41]
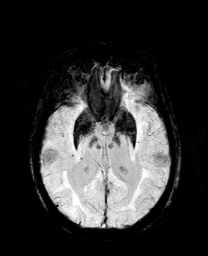
[im 29/41]
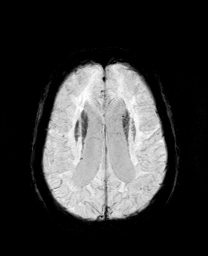
[im 35/41]
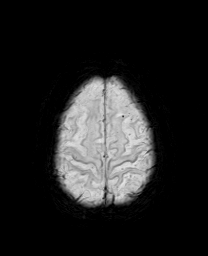
[im 41/41]
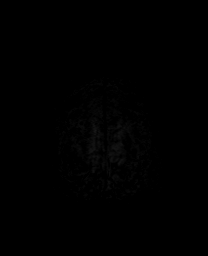

[Series 8: FLAIR · axial · 3.0mm · 0.47mm/px · z∈[-66,+71]mm · 5 of 25 slices shown]
[im 1/25]
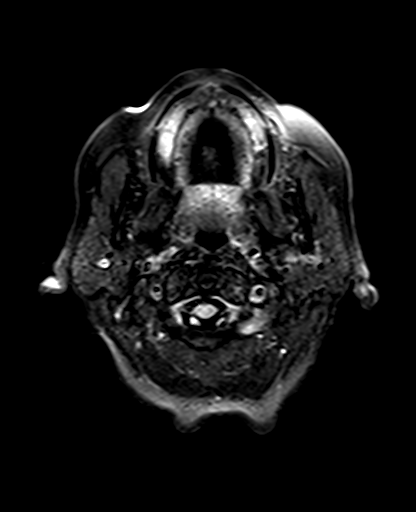
[im 7/25]
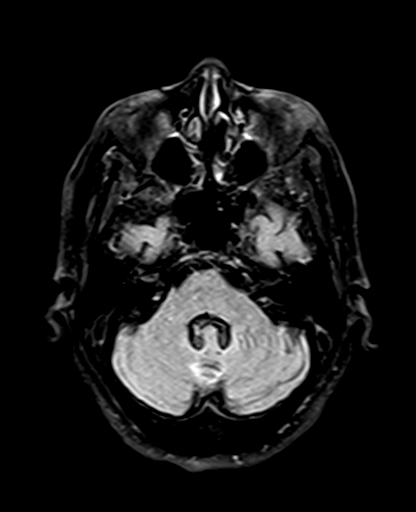
[im 13/25]
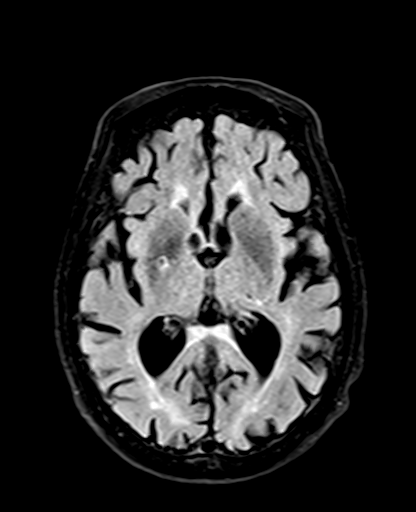
[im 19/25]
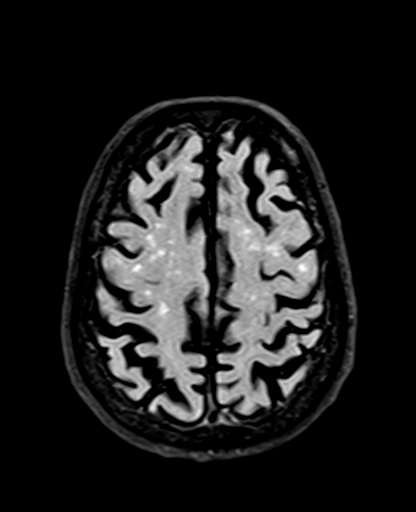
[im 25/25]
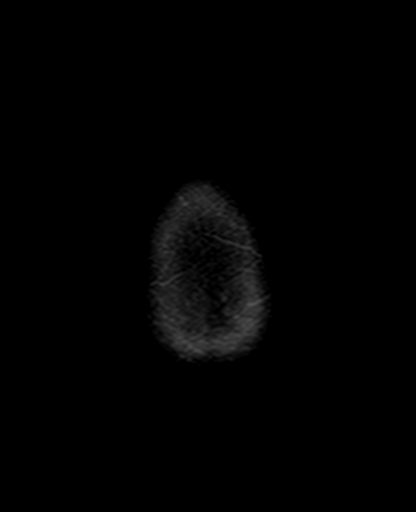

[Series 9: T1 · sagittal · 5.0mm · 0.72mm/px · 5 of 23 slices shown]
[im 1/23]
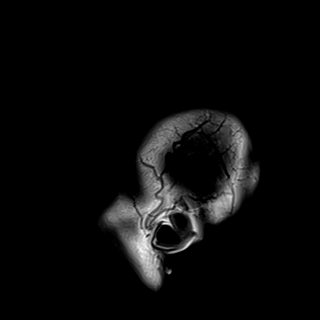
[im 6/23]
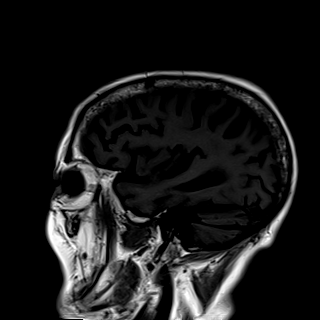
[im 12/23]
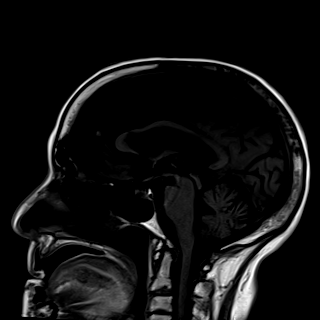
[im 17/23]
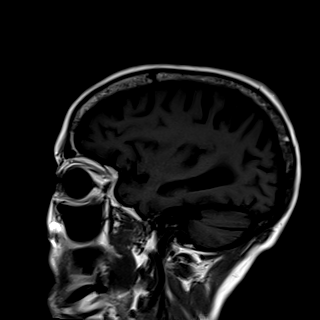
[im 23/23]
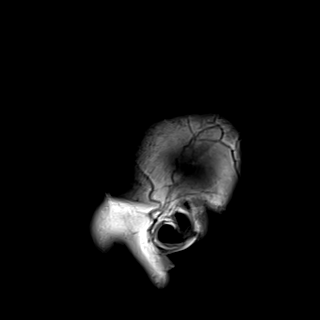

[Series 11: T2 · coronal · 5.0mm · 0.72mm/px · 5 of 27 slices shown (2 of 2)]
[im 1/27]
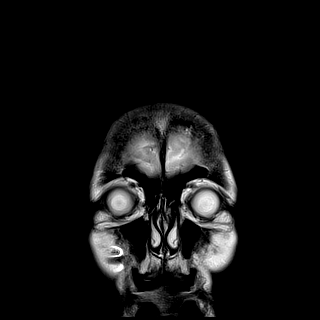
[im 7/27]
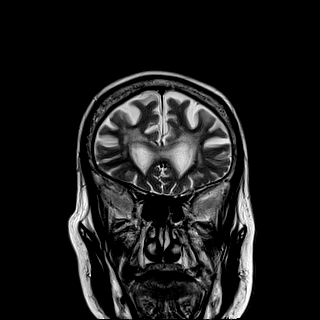
[im 14/27]
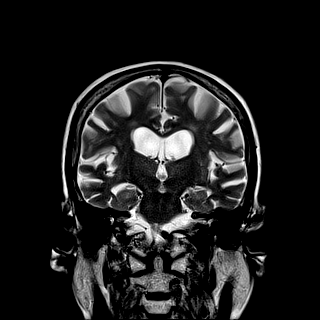
[im 20/27]
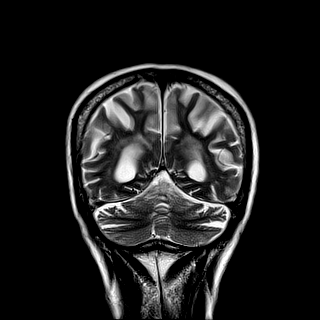
[im 27/27]
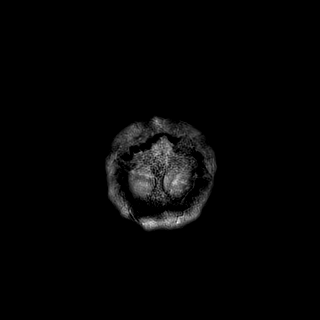

[38 of 48 positions shown; findings below may reference images not displayed]

FINDINGS: MRI HEAD FINDINGS

INTRACRANIAL CONTENTS: No reduced diffusion to suggest acute
ischemia. A few scattered chronic microhemorrhages present. The
ventricles and sulci are normal for patient's age. Old RIGHT basal
ganglia and thalami lacunar infarcts. Old small LEFT cerebellar
infarcts. Patchy to confluent supratentorial white matter FLAIR T2
hyperintensities. No suspicious parenchymal signal, masses, mass
effect. No abnormal extra-axial fluid collections. No extra-axial
masses.

VASCULAR: Normal major intracranial vascular flow voids present at
skull base.

SKULL AND UPPER CERVICAL SPINE: No abnormal sellar expansion. No
suspicious calvarial bone marrow signal. Craniocervical junction
maintained.

SINUSES/ORBITS: The mastoid air-cells and included paranasal sinuses
are well-aerated.The included ocular globes and orbital contents are
non-suspicious. Status post bilateral ocular lens implants.

Patient is edentulous..

MRA HEAD FINDINGS

ANTERIOR CIRCULATION: Flow related enhancement of the included
cervical, petrous, cavernous and supraclinoid internal carotid
arteries. 3 mm L pouching LEFT cavernous ICA. Fusiform aneurysm
RIGHT cavernous ICA measuring to 6 mm. Moderate RIGHT and severe
stenosis LEFT paraophthalmic segments. 2 mm aneurysm RIGHT A1 2
junction. Patent anterior communicating artery. Patent anterior and
middle cerebral arteries. Multifocal bilateral MCA stenosis.

No large vessel occlusion, flow limiting stenosis, aneurysm.

POSTERIOR CIRCULATION: LEFT vertebral artery is dominant.
Vertebrobasilar arteries are patent, with normal flow related
enhancement of the main branch vessels. Moderate luminal
irregularity LEFT vertebral artery and basilar artery compatible
with atherosclerosis. Patent posterior cerebral arteries. Mild
stenosis LEFT P2 segment.

No large vessel occlusion, flow limiting stenosis.

ANATOMIC VARIANTS: None.

Source images and MIP images were reviewed.

MRA NECK FINDINGS

ANTERIOR CIRCULATION: Arch vessel origins are patent. Moderate
stenosis bilateral subclavian arteries, LEFT axillary artery the
common carotid arteries are widely patent bilaterally. The carotid
bifurcations are patent bilaterally without hemodynamically
significant stenosis by NASCET criteria. No flow limiting stenosis
or luminal irregularity.

POSTERIOR CIRCULATION: Bilateral vertebral arteries are patent to
the vertebrobasilar junction. Severe stenosis LEFT V1 segment.

Source images and MIP images were reviewed.
IMPRESSION: MRI HEAD:

1. No acute intracranial process.
2. Moderate chronic small vessel ischemic disease.
3. Old small basal ganglia, thalami and LEFT cerebellar infarcts.

MRA HEAD:

1. No emergent large vessel occlusion.
2. Severe LEFT and moderate RIGHT ICA stenosis, paraophthalmic
segment. 2 mm RIGHT A1 2 junction aneurysm.
3. Multifocal moderate stenosis bilateral MCA, LEFT vertebral artery
and basilar artery compatible with atherosclerosis.
4. 3 mm aneurysm LEFT cavernous ICA. 6 mm fusiform aneurysm RIGHT
ICA.

MRA NECK:

1. No hemodynamically significant stenosis internal carotid
arteries.
2. Patent vertebral arteries, severe stenosis LEFT V1 segment.

## 2018-07-28 ENCOUNTER — Other Ambulatory Visit: Payer: Self-pay

## 2018-07-28 ENCOUNTER — Encounter (HOSPITAL_COMMUNITY): Payer: Self-pay | Admitting: Emergency Medicine

## 2018-07-28 ENCOUNTER — Emergency Department (HOSPITAL_COMMUNITY)
Admission: EM | Admit: 2018-07-28 | Discharge: 2018-07-29 | Disposition: A | Discharge status: Home / Self Care | Payer: Medicare Other | Attending: Emergency Medicine | Admitting: Emergency Medicine

## 2018-07-28 DIAGNOSIS — Z5321 Procedure and treatment not carried out due to patient leaving prior to being seen by health care provider: Secondary | ICD-10-CM | POA: Diagnosis not present

## 2018-07-28 DIAGNOSIS — I10 Essential (primary) hypertension: Secondary | ICD-10-CM | POA: Diagnosis not present

## 2018-07-28 IMAGING — CT CT HEAD W/O CM
3 of 5 series · 14 of 47 positions shown, 16 images · non-contrast
Comparison: MRI 06/04/2018, head CT 06/03/2018

CLINICAL DATA: Altered LOC

EXAM:
CT HEAD WITHOUT CONTRAST
TECHNIQUE: Contiguous axial images were obtained from the base of the skull
through the vertex without intravenous contrast.

[Series 5: head 3.0 mpr cor · coronal · 0.37mm/px · 3 of 70 slices shown]
[im 24/70  brain]
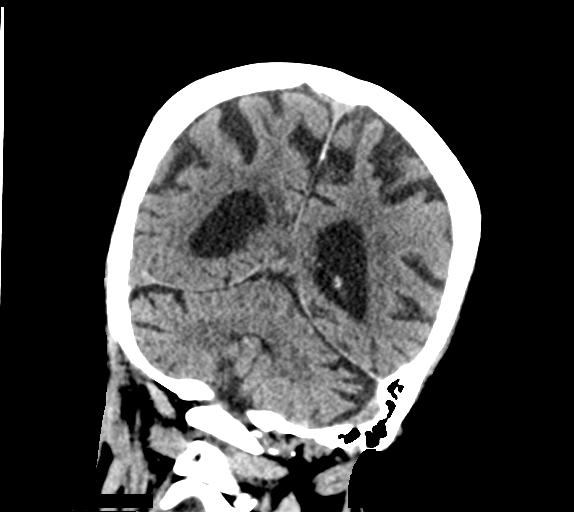
[im 31/70  brain]
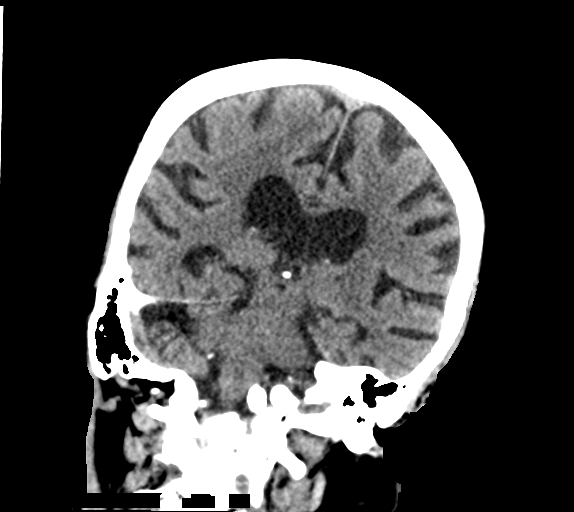
[im 39/70  brain]
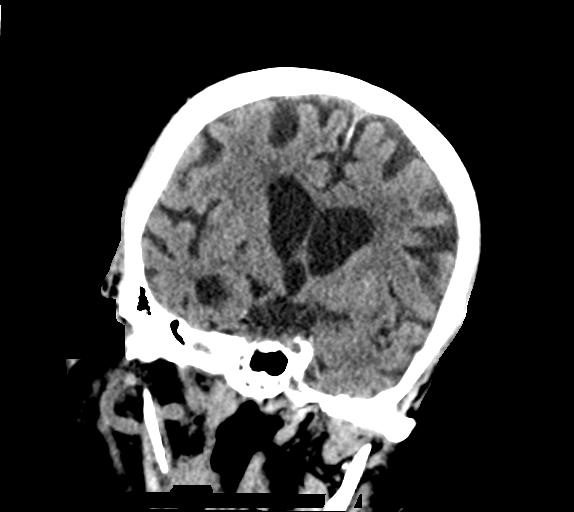

[Series 6: head 3.0 mpr sag · sagittal · 0.37mm/px · 3 of 53 slices shown]
[im 22/53  brain]
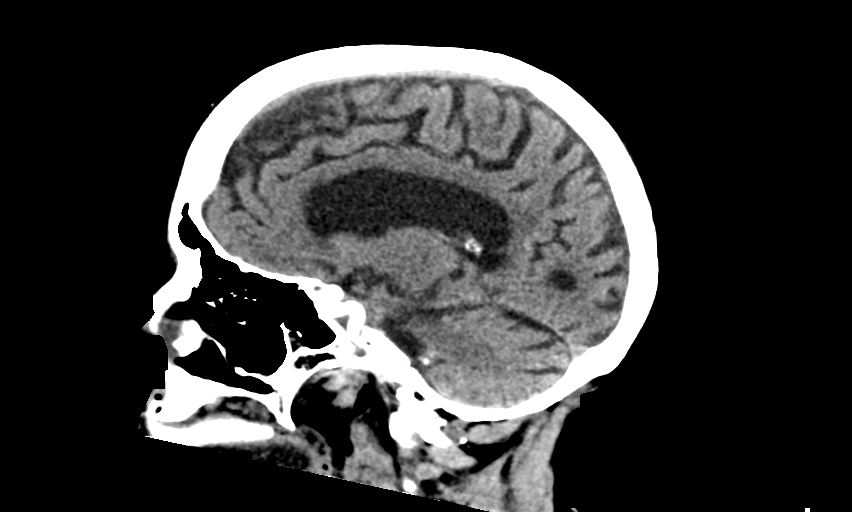
[im 27/53  brain]
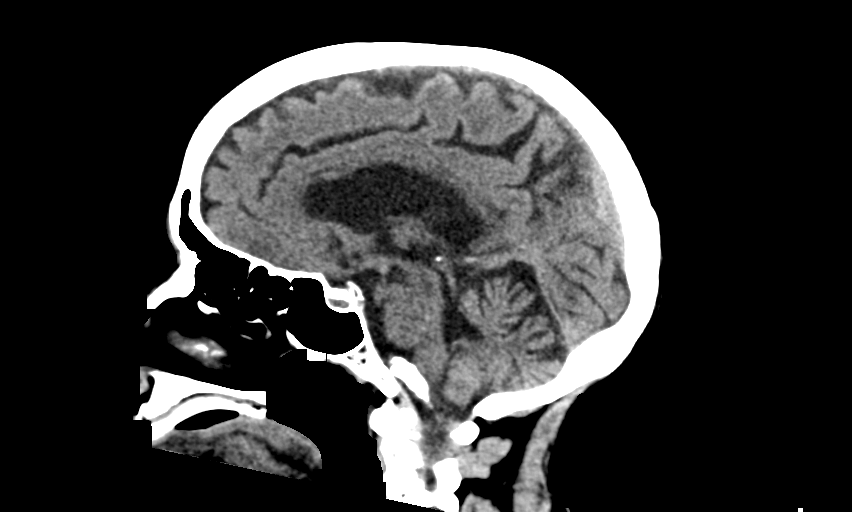
[im 31/53  brain]
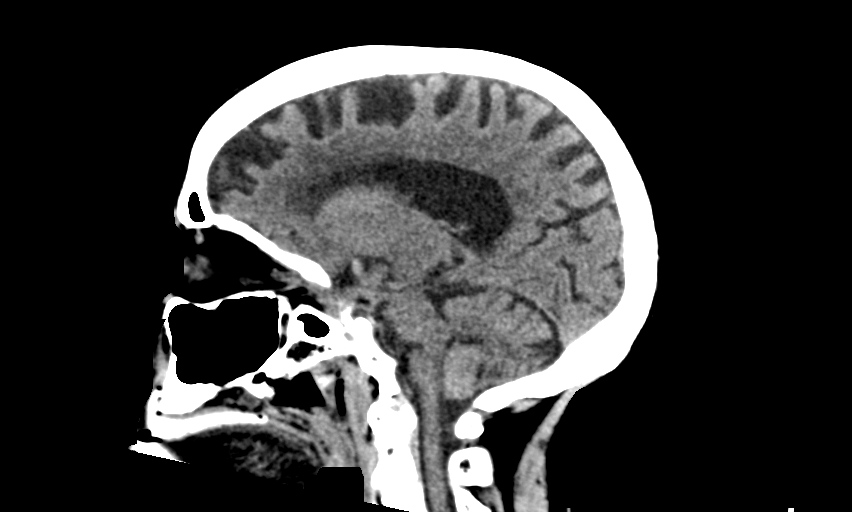

[Series 8: head 2.0 mpr ax · axial · 0.44mm/px · z∈[-105,+59]mm · 8 of 96 slices shown, 10 images]
[im 6/96  brain]
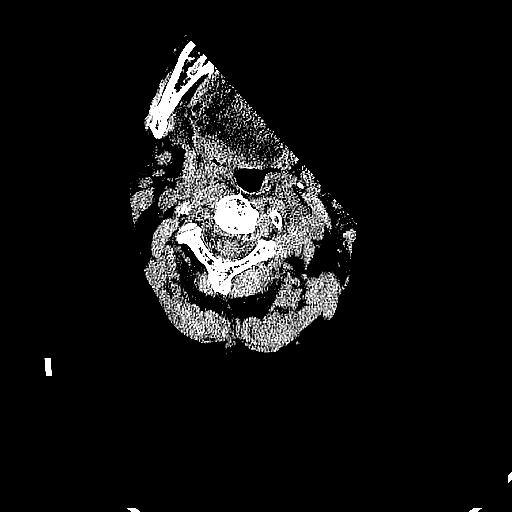
[im 6/96  bone]
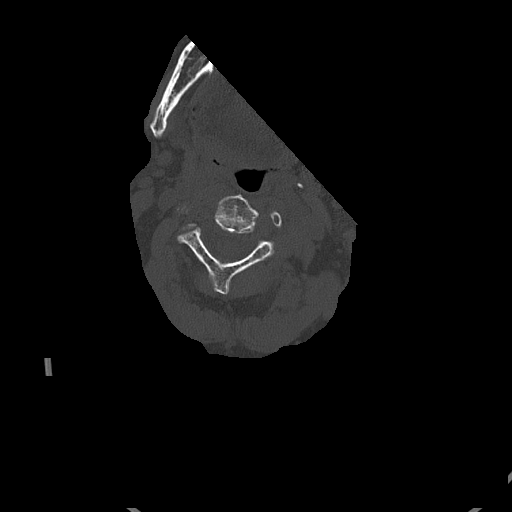
[im 18/96  brain]
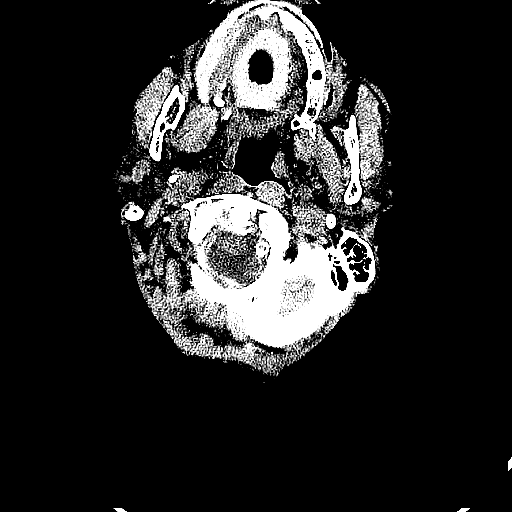
[im 30/96  brain]
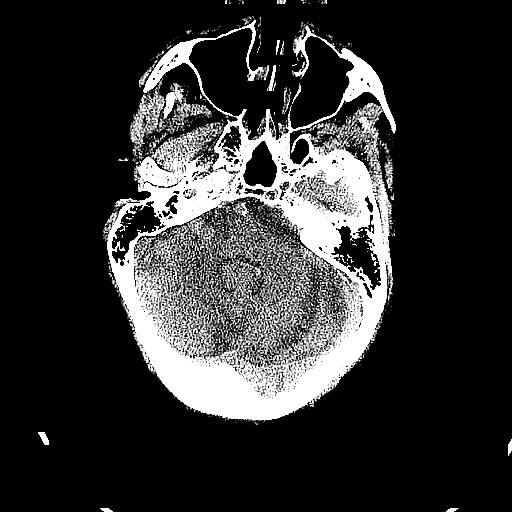
[im 42/96  brain]
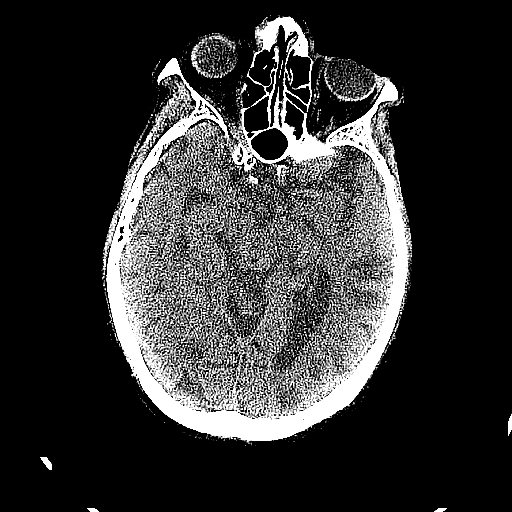
[im 54/96  brain]
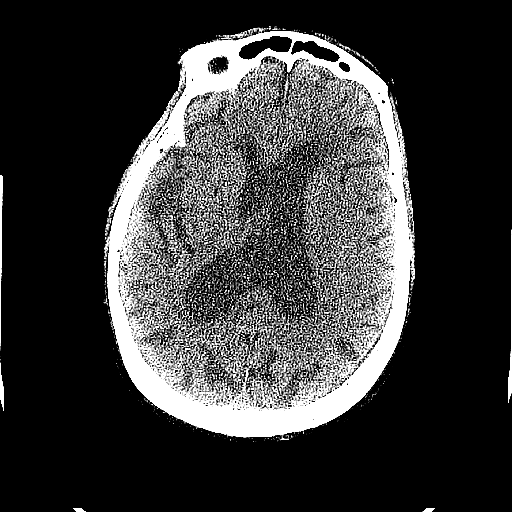
[im 54/96  bone]
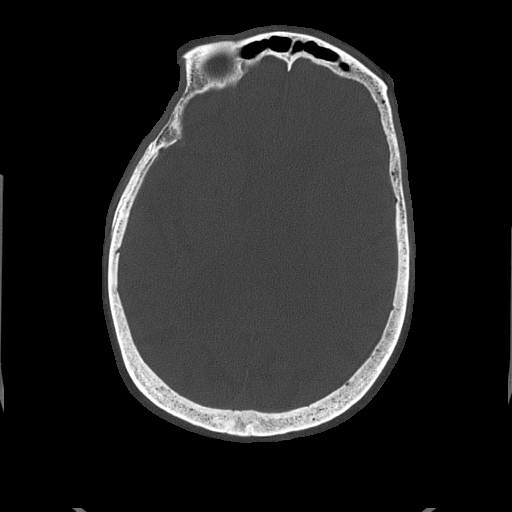
[im 66/96  brain]
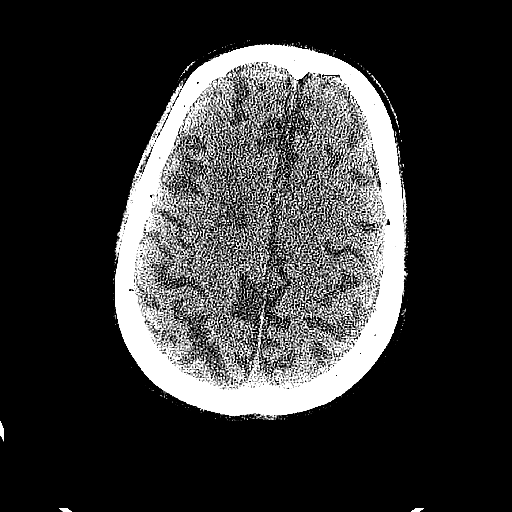
[im 78/96  brain]
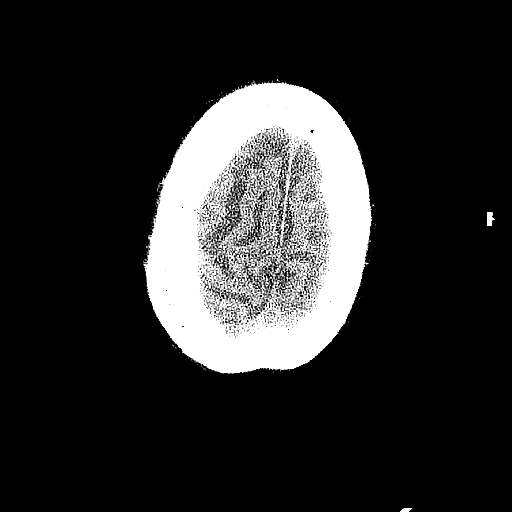
[im 90/96  brain]
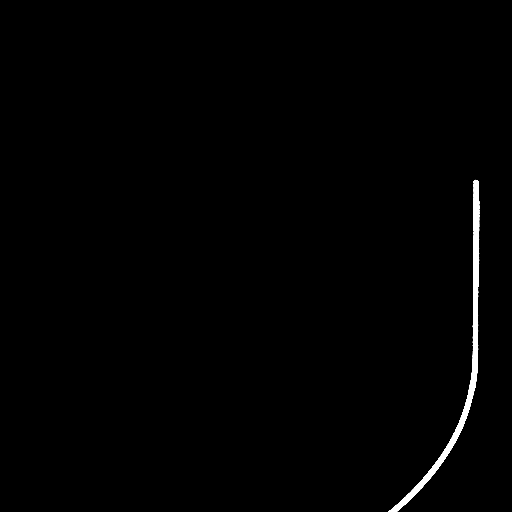

[14 of 47 positions shown; findings below may reference images not displayed]

FINDINGS: Brain: No acute territorial infarction, hemorrhage, or intracranial
mass is visualized. Atrophy and small vessel ischemic changes of the
white matter. Chronic lacunar infarcts in the right basal ganglia.
Stable ventricle size. Old lacunar infarct left cerebellum.

Vascular: No hyperdense vessels. Vertebral and carotid vascular
calcification

Skull: No fracture

Sinuses/Orbits: Mild mucosal thickening in the maxillary and ethmoid
sinuses

Other: None
IMPRESSION: 1. No CT evidence for acute intracranial abnormality.
2. Atrophy and small vessel ischemic changes of the white matter.
Chronic lacunar infarcts in the basal ganglia and left cerebellum.

## 2018-07-28 NOTE — ED Notes (Signed)
Pt called for room x4 no reply.

## 2018-07-28 NOTE — ED Triage Notes (Signed)
Pt admits to ED asking for BP check. Reports its been high since Friday.

## 2018-07-28 NOTE — ED Notes (Signed)
Patient did not answer when called for rooming x 3 

## 2018-07-29 ENCOUNTER — Other Ambulatory Visit: Payer: Self-pay | Admitting: Family Medicine

## 2018-07-30 ENCOUNTER — Encounter: Payer: Self-pay | Admitting: Family Medicine

## 2018-07-30 DIAGNOSIS — H353133 Nonexudative age-related macular degeneration, bilateral, advanced atrophic without subfoveal involvement: Secondary | ICD-10-CM | POA: Insufficient documentation

## 2018-07-30 DIAGNOSIS — I10 Essential (primary) hypertension: Secondary | ICD-10-CM | POA: Diagnosis not present

## 2018-07-30 DIAGNOSIS — F0390 Unspecified dementia without behavioral disturbance: Secondary | ICD-10-CM | POA: Diagnosis not present

## 2018-07-30 DIAGNOSIS — E119 Type 2 diabetes mellitus without complications: Secondary | ICD-10-CM | POA: Diagnosis not present

## 2018-07-30 DIAGNOSIS — E782 Mixed hyperlipidemia: Secondary | ICD-10-CM | POA: Diagnosis not present

## 2018-07-30 DIAGNOSIS — I509 Heart failure, unspecified: Secondary | ICD-10-CM | POA: Diagnosis not present

## 2018-08-08 ENCOUNTER — Other Ambulatory Visit: Payer: Self-pay | Admitting: Family Medicine

## 2018-08-08 ENCOUNTER — Telehealth: Payer: Self-pay

## 2018-08-08 ENCOUNTER — Telehealth: Payer: Self-pay | Admitting: Family Medicine

## 2018-08-08 DIAGNOSIS — R300 Dysuria: Secondary | ICD-10-CM

## 2018-08-08 MED ORDER — PHENAZOPYRIDINE HCL 200 MG PO TABS: 200.0000 mg | ORAL_TABLET | Freq: Three times a day (TID) | ORAL | 0 refills | Status: DC | PRN

## 2018-08-08 MED ORDER — CEPHALEXIN 500 MG PO CAPS: 500.0000 mg | ORAL_CAPSULE | Freq: Four times a day (QID) | ORAL | 0 refills | Status: AC

## 2018-08-08 NOTE — Telephone Encounter (Signed)
Patient daughter, [name], stated patient complaining of dysuria since last night. No fever. Wants to know if she can get an antibiotic without an office visit.   [name] call back is [phone]  Danley Danker, RN [facility] RN)

## 2018-08-08 NOTE — Telephone Encounter (Signed)
Pts daughter called nurse line again requesting an antibiotic. Pts daughter states shes not feeling well. I advised daughter since its late in the day if she wants to take her to urgent care she can, as mcmullen has been in clinic today.

## 2018-08-08 NOTE — Telephone Encounter (Signed)
Left voicemail to inform daughter. Danley Danker, RN [facility] RN)

## 2018-08-08 NOTE — Telephone Encounter (Signed)
Contacted patient's daughter [name] regarding patient complaining of dysuria over the last few days.  She also reports patient has a slightly decreased appetite and is having some dry heaving at times.  Patient's daughter denies patient having increased frequency or decreased frequency of urination.  She denies hematuria.  Denies flank pain.  Called and Keflex 500 mg 4 times daily for 5 days to treat for acute cystitis also cover for possibility of pyelonephritis given changes to appetite.  Discussed red flags and advised to seek immediate medical attention if symptoms worsen.  [name], [place], PGY-3

## 2018-08-08 NOTE — Telephone Encounter (Signed)
Called in abx.  [name], [place], PGY-3

## 2018-08-13 ENCOUNTER — Other Ambulatory Visit: Payer: Self-pay | Admitting: Family Medicine

## 2018-08-13 ENCOUNTER — Telehealth: Payer: Self-pay

## 2018-08-13 DIAGNOSIS — E119 Type 2 diabetes mellitus without complications: Secondary | ICD-10-CM | POA: Diagnosis not present

## 2018-08-13 DIAGNOSIS — H353134 Nonexudative age-related macular degeneration, bilateral, advanced atrophic with subfoveal involvement: Secondary | ICD-10-CM | POA: Diagnosis not present

## 2018-08-13 DIAGNOSIS — H43813 Vitreous degeneration, bilateral: Secondary | ICD-10-CM | POA: Diagnosis not present

## 2018-08-13 NOTE — Telephone Encounter (Signed)
Left message for patient concerning the request for metformin per Dr. Yisroel Ramming.  Informed patient that if there are nay questions or concerns to please call the clinic.  Marland KitchenOzella Almond, CMA

## 2018-08-13 NOTE — Telephone Encounter (Signed)
Attempted to contact patient by home and mobile number, no answer.  Plan to discontinue metformin given patient's age.  Risks outweigh benefits for medication.  Will not refill at this time.  Please reassure patient she does not need to continue this medication and her diabetes can be managed by her current diet.  [name], [place], PGY-3

## 2018-08-19 ENCOUNTER — Ambulatory Visit: Payer: Self-pay | Admitting: Family Medicine

## 2018-08-19 NOTE — Progress Notes (Signed)
NEUROLOGY CONSULTATION NOTE  [name] MRN: [id number] DOB: [birth year]  Referring provider: [name], DO Primary care provider: [name], DO  Reason for consult:  TIA  HISTORY OF PRESENT ILLNESS: [name] is a 84 year old right-handed female with CAD with stent [id number], chronic systolic heart failur, hypertension, hyperlipidemia, type 2 diabetes and history of breast cancer who presents for TIA.  History supplemented by hospital notes and referring provider's note.  She is accompanied by her daughter who also supplements history.  She was admitted to [facility] from 06/03/18 to 06/05/18 after presenting with confusion (not answering questions appropriately), slurred speech and left side of her face was twisted. Symptoms lasted 15 minutes.  CT of head showed no acute abnormality.  TPA was not administered due to poor functional baseline and improving symptoms.  MRI of brain showed chronic small vessel disease with old lacunar infarcts in the basal ganglia, thalami and left cerebellar hemisphere but no acute stroke.  MRA of head showed severe left and moderate right ICA stenosis, paraophthalmic segment, multifocal moderate atherosclerotic stenosis involving the bilateral MCA, left vertebral artery and basilar artery as well as 2 mm right A1 2 junction aneurysm, 3 mm left cavernous ICA aneurysm and 6 mm right ICA fusiform aneurysm.  MRA of neck showed no hemodynamically significant stenosis but demonstrated severe left V1 segment stenosis.  2D echo showed EF 60-65% with grade 1 diastolic dysfunction.  LDL was 32 (on Crestor).  Hgb A1c was 6.7.  ASA 81mg  daily was switched to ASA 325mg  and Plavix 75mg  for 3 weeks, followed by ASA alone.  She was also treated for pneumonia, UTI and dehydration.  PAST MEDICAL HISTORY: Past Medical History:  Diagnosis Date  . Allergy   . Anemia    Iron def anemia  . Arthritis   . Asthma   . Breast cancer ([place]) 10/2010   Invasive Ductal  Carcinoma, s/p bilateral mastectomy, now on Tamoxifen. Followed by Dr Jamse Arn.   . Breast cancer ([place]) 2011   s/p Bilateral masectomy  . CAD (coronary artery disease)   . Carotid artery aneurysm ([place]) 08/2010   right ICA, 5 x 13mm  . CHF (congestive heart failure) ([facility])    Systolic   . Colon cancer ([place])   . Colon cancer ([place]) 12/11/2013  . DDD (degenerative disc disease), cervical   . Diabetes mellitus   . Diverticulosis   . GERD (gastroesophageal reflux disease)   . H/O: GI bleed   . Hiatal hernia   . History of colon cancer   . Hypercalcemia 06/09/2014  . Hyperlipidemia   . Hypertension   . Myocardial infarction ([place])   . Neuropathy   . Pneumonia 03/2012  . Shortness of breath     PAST SURGICAL HISTORY: Past Surgical History:  Procedure Laterality Date  . Breast masectomy  10/2010   Bilateral masectomy by Dr Lucia Gaskins s/p invasive ductal carcinoma.  Marland Kitchen BREAST SURGERY    . Cardiac  Catherization  2007   Severe 3-vessel disease.  EF 20-25%.  . ESOPHAGOGASTRODUODENOSCOPY  2001   Esophageal Tear  . ESOPHAGOGASTRODUODENOSCOPY  10/27/2012   Procedure: ESOPHAGOGASTRODUODENOSCOPY (EGD);  Surgeon: Irene Shipper, MD;  Location[facility] ENDOSCOPY;  Service: Endoscopy;  Laterality: N/A;  pat  . JOINT REPLACEMENT Right 2001   Knee  . TOTAL KNEE ARTHROPLASTY  2001    MEDICATIONS: Current Outpatient Medications on File Prior to Visit  Medication Sig Dispense Refill  . acetaminophen (TYLENOL) 500 MG tablet  Take 500 mg by mouth every 6 (six) hours as needed for moderate pain.     Marland Kitchen albuterol (PROVENTIL HFA;VENTOLIN HFA) 108 (90 BASE) MCG/ACT inhaler Inhale 2 puffs into the lungs every 6 (six) hours as needed. For shortness of breath (Patient taking differently: Inhale 2 puffs into the lungs every 6 (six) hours as needed for shortness of breath. ) 1 Inhaler 6  . Amino Acids-Protein Hydrolys (FEEDING SUPPLEMENT, PRO-STAT SUGAR FREE 64,) LIQD Take 30 mLs by mouth 2 (two) times daily with a meal.  ensure    . amLODipine (NORVASC) 5 MG tablet Take 1 tablet (5 mg total) by mouth daily. 90 tablet 3  . aspirin EC 325 MG tablet Take 1 tablet (325 mg total) by mouth daily. 90 tablet 3  . capsaicin (ZOSTRIX) 0.025 % cream APPLY TO AFFECTED AREA TWICE A DAY 60 g 1  . carvedilol (COREG) 12.5 MG tablet TAKE 1 TABLET BY MOUTH TWICE A DAY WITH A MEAL 90 tablet 3  . guaifenesin (ROBITUSSIN) 100 MG/5ML syrup Take 10 mLs (200 mg total) by mouth 3 (three) times daily as needed for cough. 120 mL 0  . hydroxypropyl methylcellulose / hypromellose (ISOPTO TEARS / GONIOVISC) 2.5 % ophthalmic solution Place 1 drop 3 (three) times daily into both eyes. 15 mL 12  . loratadine (CLARITIN) 10 MG tablet TAKE 1/2 TABLET BY MOUTH DAILY (Patient taking differently: Take 5 mg by mouth once a day as needed for allergies) 15 tablet 0  . losartan (COZAAR) 50 MG tablet Take 1 tablet (50 mg total) by mouth daily. 90 tablet 3  . metFORMIN (GLUCOPHAGE) 500 MG tablet Take 1 tablet (500 mg total) by mouth daily with breakfast. 90 tablet 3  . pantoprazole (PROTONIX) 40 MG tablet TAKE 1 TABLET BY MOUTH EVERY DAY 30 tablet 0  . PATADAY 0.2 % SOLN APPLY 1 DROP TO EYE DAILY. 2.5 mL 0  . petrolatum-hydrophilic-aloe vera (ALOE VESTA) ointment Apply topically 2 (two) times daily as needed for wound care. 226 g 0  . phenazopyridine (PYRIDIUM) 200 MG tablet Take 1 tablet (200 mg total) by mouth 3 (three) times daily as needed for pain. 10 tablet 0  . ranitidine (ZANTAC) 150 MG tablet TAKE 1 TABLET BY MOUTH TWICE A DAY 180 tablet 1  . rosuvastatin (CRESTOR) 20 MG tablet TAKE 1 TABLET BY MOUTH EVERYDAY AT BEDTIME 30 tablet 6  . senna-docusate (SENOKOT-S) 8.6-50 MG tablet Take 2 tablets by mouth at bedtime. (Patient taking differently: Take 1 tablet by mouth every other day. ) 60 tablet 3  . silver sulfADIAZINE (SILVADENE) 1 % cream Apply 1 application topically daily. 50 g 0  . [DISCONTINUED] cetirizine (ZYRTEC) 5 MG tablet Take 1 tablet (5 mg  total) by mouth daily. Take at night time as it can cause some drowsiness 30 tablet 4   No current facility-administered medications on file prior to visit.     ALLERGIES: Allergies  Allergen Reactions  . Tape Other (See Comments)    Skin is very thin and will tear and bruise easily!!  . Lisinopril Cough    FAMILY HISTORY: Family History  Problem Relation Age of Onset  . Sickle cell anemia Other   . Heart disease Mother   . Heart disease Father   . Cancer Daughter 67       breast ca  . Hypertension Daughter   . Cancer Daughter 6       breast ca  . Hypertension Daughter   . Heart  disease Son        Poor circulation-Left Leg  . Diabetes Daughter   . Hypertension Daughter   . Hyperlipidemia Daughter        Poor circulation- Toe amputation    SOCIAL HISTORY: Social History   Socioeconomic History  . Marital status: Widowed    Spouse name: Not on file  . Number of children: Not on file  . Years of education: Not on file  . Highest education level: Not on file  Occupational History  . Not on file  Social Needs  . Financial resource strain: Not on file  . Food insecurity:    Worry: Not on file    Inability: Not on file  . Transportation needs:    Medical: Not on file    Non-medical: Not on file  Tobacco Use  . Smoking status: Never Smoker  . Smokeless tobacco: Current User    Types: Snuff  Substance and Sexual Activity  . Alcohol use: No  . Drug use: No  . Sexual activity: Never  Lifestyle  . Physical activity:    Days per week: Not on file    Minutes per session: Not on file  . Stress: Not on file  Relationships  . Social connections:    Talks on phone: Not on file    Gets together: Not on file    Attends religious service: Not on file    Active member of club or organization: Not on file    Attends meetings of clubs or organizations: Not on file    Relationship status: Not on file  . Intimate partner violence:    Fear of current or ex partner: Not  on file    Emotionally abused: Not on file    Physically abused: Not on file    Forced sexual activity: Not on file  Other Topics Concern  . Not on file  Social History Narrative   Widowed.  She lives with her son and has 2 daughters who help with her care.  She has a large extended family in the area who are very involved.     REVIEW OF SYSTEMS: Constitutional: No fevers, chills, or sweats, no generalized fatigue, change in appetite Eyes: No visual changes, double vision, eye pain Ear, nose and throat: No hearing loss, ear pain, nasal congestion, sore throat Cardiovascular: No chest pain, palpitations Respiratory:  No shortness of breath at rest or with exertion, wheezes GastrointestinaI: No nausea, vomiting, diarrhea, abdominal pain, fecal incontinence Genitourinary:  No dysuria, urinary retention or frequency Musculoskeletal:  No neck pain, back pain Integumentary: No rash, pruritus, skin lesions Neurological: as above Psychiatric: No depression, insomnia, anxiety Endocrine: No palpitations, fatigue, diaphoresis, mood swings, change in appetite, change in weight, increased thirst Hematologic/Lymphatic:  No purpura, petechiae. Allergic/Immunologic: no itchy/runny eyes, nasal congestion, recent allergic reactions, rashes  PHYSICAL EXAM: Blood pressure (!) 158/68, pulse 84, height 4\' 5"  (1.346 m), weight 115 lb (52.2 kg), SpO2 97 %. General: No acute distress.  Patient appears well-groomed.   Head:  Normocephalic/atraumatic Eyes:  fundi examined but not visualized Neck: supple, no paraspinal tenderness, full range of motion Back: No paraspinal tenderness Heart: regular rate and rhythm Lungs: Clear to auscultation bilaterally. Vascular: No carotid bruits. Neurological Exam: Mental status: alert and oriented to person, place, and time, recent and remote memory intact, fund of knowledge intact, attention and concentration intact, speech fluent and not dysarthric, language  intact. Cranial nerves: CN I: not tested CN II: pupils equal, round  and reactive to light, visual fields intact CN III, IV, VI:  full range of motion, no nystagmus, no ptosis CN V: facial sensation intact CN VII: upper and lower face symmetric CN VIII: hearing intact CN IX, X: gag intact, uvula midline CN XI: sternocleidomastoid and trapezius muscles intact CN XII: tongue midline Bulk & Tone: normal, no fasciculations. Motor:  4+/5 throughout  Sensation:  Light touch sensation intact.. Deep Tendon Reflexes:  absent throughout, toes downgoing.  Finger to nose testing:  Without dysmetria.  Gait:  Requires assistance to stand from wheelchair.  Wide-based short stride.  Difficulty walking.  Romberg not tested.  IMPRESSION: 1.  Questionable TIA (left facial droop) 2.  Encephalopathy.  More likely secondary to UTI/dehydration than symptom of TIA  PLAN: 1.  Continue aspirin daily 2.  Continue Crestor 3.  Continue blood pressure and glycemic control 4.  Follow up in 6 months.  Thank you for allowing me to take part in the care of this patient.  Metta Clines, DO  CC:  [name], DO

## 2018-08-19 NOTE — Progress Notes (Deleted)
   Subjective   Patient ID: [name]    DOB: 1924/02/13, 84 y.o. female   MRN: [id number]  CC: "***"  HPI: [name] is a 84 y.o. female who presents to clinic today for the following:  ***: ***  ***Last seen by me on 9/10 for associated hypotension related to hypertensive therapy.  Decision to decrease carvedilol to 12.5 mg twice daily and amlodipine to 5 mg daily along with restarting losartan.  Received call from daughter regarding UTI symptoms.  Given Keflex.  Patient here today for blood pressure recheck.  Should bring ambulatory blood pressures.  ROS: see HPI for pertinent.  [place]: HTN, PAD, h/o vertebrobasilar artery syndrome, HFpEF, hiatal hernia, GERD, tobacco use disorder, h/o b/l-breast cancer, h/o colon cancer, severe protein-calorie malnutrition, gout, IDA.Surgical history tot knee, EGD for esophogeal tear, b/l mastectomy, cardiac cath 2007 severe 3-vessel disease. Family history heart disease. Smoking status reviewed. Medications reviewed.  Objective   There were no vitals taken for this visit. Vitals and nursing note reviewed.  General: well nourished, well developed, NAD with non-toxic appearance HEENT: normocephalic, atraumatic, moist mucous membranes Neck: supple, non-tender without lymphadenopathy Cardiovascular: regular rate and rhythm without murmurs, rubs, or gallops Lungs: clear to auscultation bilaterally with normal work of breathing Abdomen: soft, non-tender, non-distended, normoactive bowel sounds Skin: warm, dry, no rashes or lesions, cap refill < 2 seconds Extremities: warm and well perfused, normal tone, no edema  Assessment & Plan   No problem-specific Assessment & Plan notes found for this encounter.  No orders of the defined types were placed in this encounter.  No orders of the defined types were placed in this encounter.   [name], [place], PGY-3 08/19/2018, 8:26 AM

## 2018-08-20 ENCOUNTER — Ambulatory Visit (INDEPENDENT_AMBULATORY_CARE_PROVIDER_SITE_OTHER): Payer: Medicare Other | Admitting: Neurology

## 2018-08-20 ENCOUNTER — Encounter: Payer: Self-pay | Admitting: Neurology

## 2018-08-20 VITALS — BP 158/68 | HR 84 | Ht <= 58 in | Wt 115.0 lb

## 2018-08-20 DIAGNOSIS — G459 Transient cerebral ischemic attack, unspecified: Secondary | ICD-10-CM

## 2018-08-20 DIAGNOSIS — G9340 Encephalopathy, unspecified: Secondary | ICD-10-CM | POA: Diagnosis not present

## 2018-08-20 NOTE — Patient Instructions (Addendum)
You may have had a light stroke, given that you had a facial droop.   Continue aspirin daily Continue Crestor Follow up in 6 months.

## 2018-08-23 ENCOUNTER — Other Ambulatory Visit: Payer: Self-pay | Admitting: Family Medicine

## 2018-08-26 ENCOUNTER — Emergency Department (HOSPITAL_COMMUNITY): Payer: Medicare Other

## 2018-08-26 ENCOUNTER — Encounter (HOSPITAL_COMMUNITY): Payer: Self-pay | Admitting: Emergency Medicine

## 2018-08-26 ENCOUNTER — Observation Stay (HOSPITAL_COMMUNITY)
Admission: EM | Admit: 2018-08-26 | Discharge: 2018-08-29 | Disposition: A | Discharge status: Skilled Nursing Facility | Payer: Medicare Other | Attending: Family Medicine | Admitting: Family Medicine

## 2018-08-26 ENCOUNTER — Other Ambulatory Visit: Payer: Self-pay

## 2018-08-26 DIAGNOSIS — K5900 Constipation, unspecified: Secondary | ICD-10-CM | POA: Diagnosis not present

## 2018-08-26 DIAGNOSIS — Z85038 Personal history of other malignant neoplasm of large intestine: Secondary | ICD-10-CM | POA: Insufficient documentation

## 2018-08-26 DIAGNOSIS — I517 Cardiomegaly: Secondary | ICD-10-CM | POA: Diagnosis present

## 2018-08-26 DIAGNOSIS — G450 Vertebro-basilar artery syndrome: Secondary | ICD-10-CM | POA: Insufficient documentation

## 2018-08-26 DIAGNOSIS — I2510 Atherosclerotic heart disease of native coronary artery without angina pectoris: Secondary | ICD-10-CM | POA: Insufficient documentation

## 2018-08-26 DIAGNOSIS — R0789 Other chest pain: Secondary | ICD-10-CM | POA: Diagnosis not present

## 2018-08-26 DIAGNOSIS — F329 Major depressive disorder, single episode, unspecified: Secondary | ICD-10-CM | POA: Insufficient documentation

## 2018-08-26 DIAGNOSIS — M5030 Other cervical disc degeneration, unspecified cervical region: Secondary | ICD-10-CM | POA: Insufficient documentation

## 2018-08-26 DIAGNOSIS — H353130 Nonexudative age-related macular degeneration, bilateral, stage unspecified: Secondary | ICD-10-CM | POA: Insufficient documentation

## 2018-08-26 DIAGNOSIS — Z79899 Other long term (current) drug therapy: Secondary | ICD-10-CM | POA: Insufficient documentation

## 2018-08-26 DIAGNOSIS — E1140 Type 2 diabetes mellitus with diabetic neuropathy, unspecified: Secondary | ICD-10-CM | POA: Diagnosis not present

## 2018-08-26 DIAGNOSIS — L89622 Pressure ulcer of left heel, stage 2: Secondary | ICD-10-CM | POA: Diagnosis not present

## 2018-08-26 DIAGNOSIS — M109 Gout, unspecified: Secondary | ICD-10-CM | POA: Insufficient documentation

## 2018-08-26 DIAGNOSIS — R079 Chest pain, unspecified: Secondary | ICD-10-CM | POA: Diagnosis not present

## 2018-08-26 DIAGNOSIS — F17290 Nicotine dependence, other tobacco product, uncomplicated: Secondary | ICD-10-CM | POA: Insufficient documentation

## 2018-08-26 DIAGNOSIS — Z8673 Personal history of transient ischemic attack (TIA), and cerebral infarction without residual deficits: Secondary | ICD-10-CM | POA: Insufficient documentation

## 2018-08-26 DIAGNOSIS — E785 Hyperlipidemia, unspecified: Secondary | ICD-10-CM | POA: Diagnosis not present

## 2018-08-26 DIAGNOSIS — N390 Urinary tract infection, site not specified: Secondary | ICD-10-CM | POA: Insufficient documentation

## 2018-08-26 DIAGNOSIS — I10 Essential (primary) hypertension: Secondary | ICD-10-CM | POA: Diagnosis present

## 2018-08-26 DIAGNOSIS — Z7951 Long term (current) use of inhaled steroids: Secondary | ICD-10-CM | POA: Insufficient documentation

## 2018-08-26 DIAGNOSIS — J45909 Unspecified asthma, uncomplicated: Secondary | ICD-10-CM | POA: Insufficient documentation

## 2018-08-26 DIAGNOSIS — Z6822 Body mass index (BMI) 22.0-22.9, adult: Secondary | ICD-10-CM | POA: Insufficient documentation

## 2018-08-26 DIAGNOSIS — Z832 Family history of diseases of the blood and blood-forming organs and certain disorders involving the immune mechanism: Secondary | ICD-10-CM | POA: Insufficient documentation

## 2018-08-26 DIAGNOSIS — D509 Iron deficiency anemia, unspecified: Secondary | ICD-10-CM | POA: Diagnosis present

## 2018-08-26 DIAGNOSIS — K449 Diaphragmatic hernia without obstruction or gangrene: Secondary | ICD-10-CM | POA: Insufficient documentation

## 2018-08-26 DIAGNOSIS — R404 Transient alteration of awareness: Secondary | ICD-10-CM | POA: Diagnosis not present

## 2018-08-26 DIAGNOSIS — I5032 Chronic diastolic (congestive) heart failure: Secondary | ICD-10-CM | POA: Diagnosis not present

## 2018-08-26 DIAGNOSIS — R45851 Suicidal ideations: Secondary | ICD-10-CM | POA: Insufficient documentation

## 2018-08-26 DIAGNOSIS — K210 Gastro-esophageal reflux disease with esophagitis: Secondary | ICD-10-CM | POA: Insufficient documentation

## 2018-08-26 DIAGNOSIS — R8271 Bacteriuria: Secondary | ICD-10-CM | POA: Insufficient documentation

## 2018-08-26 DIAGNOSIS — Z853 Personal history of malignant neoplasm of breast: Secondary | ICD-10-CM | POA: Insufficient documentation

## 2018-08-26 DIAGNOSIS — E441 Mild protein-calorie malnutrition: Secondary | ICD-10-CM | POA: Insufficient documentation

## 2018-08-26 DIAGNOSIS — F0390 Unspecified dementia without behavioral disturbance: Secondary | ICD-10-CM | POA: Insufficient documentation

## 2018-08-26 DIAGNOSIS — I493 Ventricular premature depolarization: Secondary | ICD-10-CM | POA: Insufficient documentation

## 2018-08-26 DIAGNOSIS — Z7984 Long term (current) use of oral hypoglycemic drugs: Secondary | ICD-10-CM | POA: Insufficient documentation

## 2018-08-26 DIAGNOSIS — I110 Hypertensive heart disease with heart failure: Secondary | ICD-10-CM | POA: Insufficient documentation

## 2018-08-26 DIAGNOSIS — G8918 Other acute postprocedural pain: Secondary | ICD-10-CM | POA: Diagnosis present

## 2018-08-26 DIAGNOSIS — R0602 Shortness of breath: Secondary | ICD-10-CM | POA: Diagnosis not present

## 2018-08-26 DIAGNOSIS — Z8249 Family history of ischemic heart disease and other diseases of the circulatory system: Secondary | ICD-10-CM | POA: Insufficient documentation

## 2018-08-26 DIAGNOSIS — I252 Old myocardial infarction: Secondary | ICD-10-CM | POA: Insufficient documentation

## 2018-08-26 DIAGNOSIS — Z833 Family history of diabetes mellitus: Secondary | ICD-10-CM | POA: Insufficient documentation

## 2018-08-26 DIAGNOSIS — I720 Aneurysm of carotid artery: Secondary | ICD-10-CM | POA: Insufficient documentation

## 2018-08-26 DIAGNOSIS — M1990 Unspecified osteoarthritis, unspecified site: Secondary | ICD-10-CM | POA: Diagnosis not present

## 2018-08-26 DIAGNOSIS — Z7982 Long term (current) use of aspirin: Secondary | ICD-10-CM | POA: Insufficient documentation

## 2018-08-26 DIAGNOSIS — K5790 Diverticulosis of intestine, part unspecified, without perforation or abscess without bleeding: Secondary | ICD-10-CM | POA: Diagnosis not present

## 2018-08-26 DIAGNOSIS — Z86718 Personal history of other venous thrombosis and embolism: Secondary | ICD-10-CM | POA: Insufficient documentation

## 2018-08-26 DIAGNOSIS — G8929 Other chronic pain: Secondary | ICD-10-CM | POA: Insufficient documentation

## 2018-08-26 DIAGNOSIS — Z96651 Presence of right artificial knee joint: Secondary | ICD-10-CM | POA: Insufficient documentation

## 2018-08-26 DIAGNOSIS — L89602 Pressure ulcer of unspecified heel, stage 2: Secondary | ICD-10-CM | POA: Diagnosis present

## 2018-08-26 DIAGNOSIS — Z803 Family history of malignant neoplasm of breast: Secondary | ICD-10-CM | POA: Insufficient documentation

## 2018-08-26 DIAGNOSIS — E876 Hypokalemia: Secondary | ICD-10-CM | POA: Diagnosis present

## 2018-08-26 DIAGNOSIS — Z888 Allergy status to other drugs, medicaments and biological substances status: Secondary | ICD-10-CM | POA: Insufficient documentation

## 2018-08-26 DIAGNOSIS — Z9013 Acquired absence of bilateral breasts and nipples: Secondary | ICD-10-CM | POA: Insufficient documentation

## 2018-08-26 DIAGNOSIS — E43 Unspecified severe protein-calorie malnutrition: Secondary | ICD-10-CM | POA: Diagnosis present

## 2018-08-26 HISTORY — DX: Pressure ulcer of unspecified heel, stage 2: L89.602

## 2018-08-26 LAB — CBC
HEMATOCRIT: 34.4 % — AB (ref 36.0–46.0)
HEMOGLOBIN: 10.5 g/dL — AB (ref 12.0–15.0)
MCH: 22.3 pg — ABNORMAL LOW (ref 26.0–34.0)
MCHC: 30.5 g/dL (ref 30.0–36.0)
MCV: 73.2 fL — AB (ref 80.0–100.0)
NRBC: 0 % (ref 0.0–0.2)
Platelets: 178 10*3/uL (ref 150–400)
RBC: 4.7 MIL/uL (ref 3.87–5.11)
RDW: 16 % — AB (ref 11.5–15.5)
WBC: 4.8 10*3/uL (ref 4.0–10.5)

## 2018-08-26 LAB — COMPREHENSIVE METABOLIC PANEL
ALBUMIN: 3.1 g/dL — AB (ref 3.5–5.0)
ALT: 8 U/L (ref 0–44)
ANION GAP: 6 (ref 5–15)
AST: 15 U/L (ref 15–41)
Alkaline Phosphatase: 50 U/L (ref 38–126)
BUN: 9 mg/dL (ref 8–23)
CHLORIDE: 109 mmol/L (ref 98–111)
CO2: 24 mmol/L (ref 22–32)
Calcium: 10.4 mg/dL — ABNORMAL HIGH (ref 8.9–10.3)
Creatinine, Ser: 0.93 mg/dL (ref 0.44–1.00)
GFR calc Af Amer: 60 mL/min — ABNORMAL LOW (ref >60)
GFR, EST NON AFRICAN AMERICAN: 52 mL/min — AB (ref >60)
Glucose, Bld: 121 mg/dL — ABNORMAL HIGH (ref 70–99)
POTASSIUM: 2.7 mmol/L — AB (ref 3.5–5.1)
Sodium: 139 mmol/L (ref 135–145)
Total Bilirubin: 0.6 mg/dL (ref 0.3–1.2)
Total Protein: 6.8 g/dL (ref 6.5–8.1)

## 2018-08-26 LAB — I-STAT TROPONIN, ED: Troponin i, poc: 0.02 ng/mL (ref 0.00–0.08)

## 2018-08-26 LAB — LIPASE, BLOOD: LIPASE: 48 U/L (ref 11–51)

## 2018-08-26 LAB — I-STAT CG4 LACTIC ACID, ED: LACTIC ACID, VENOUS: 0.93 mmol/L (ref 0.5–1.9)

## 2018-08-26 LAB — MRSA PCR SCREENING: MRSA by PCR: NEGATIVE

## 2018-08-26 MED ORDER — DICLOFENAC SODIUM 1 % TD GEL
2.0000 g | Freq: Four times a day (QID) | TRANSDERMAL | Status: DC
  Administered 2018-08-26 – 2018-08-29 (×3): 2 g via TOPICAL
  Filled 2018-08-26: qty 100

## 2018-08-26 MED ORDER — AMLODIPINE BESYLATE 5 MG PO TABS
5.0000 mg | ORAL_TABLET | Freq: Every day | ORAL | Status: DC
  Administered 2018-08-27 – 2018-08-29 (×3): 5 mg via ORAL
  Filled 2018-08-26 (×3): qty 1

## 2018-08-26 MED ORDER — POTASSIUM CHLORIDE 10 MEQ/100ML IV SOLN
10.0000 meq | Freq: Once | INTRAVENOUS | Status: DC
  Administered 2018-08-26: 10 meq via INTRAVENOUS
  Filled 2018-08-26: qty 100

## 2018-08-26 MED ORDER — PHENAZOPYRIDINE HCL 200 MG PO TABS: 200.0000 mg | ORAL_TABLET | Freq: Three times a day (TID) | ORAL | Status: DC

## 2018-08-26 MED ORDER — POTASSIUM CHLORIDE CRYS ER 20 MEQ PO TBCR
40.0000 meq | EXTENDED_RELEASE_TABLET | ORAL | Status: AC
  Administered 2018-08-26 (×2): 40 meq via ORAL
  Filled 2018-08-26 (×2): qty 2

## 2018-08-26 MED ORDER — SENNA 8.6 MG PO TABS
1.0000 | ORAL_TABLET | Freq: Every day | ORAL | Status: DC
  Administered 2018-08-26 – 2018-08-29 (×4): 8.6 mg via ORAL
  Filled 2018-08-26 (×4): qty 1

## 2018-08-26 MED ORDER — ENOXAPARIN SODIUM 30 MG/0.3ML ~~LOC~~ SOLN
30.0000 mg | SUBCUTANEOUS | Status: DC
  Administered 2018-08-27 – 2018-08-29 (×3): 30 mg via SUBCUTANEOUS
  Filled 2018-08-26 (×3): qty 0.3

## 2018-08-26 MED ORDER — PANTOPRAZOLE SODIUM 40 MG PO TBEC
40.0000 mg | DELAYED_RELEASE_TABLET | Freq: Every day | ORAL | Status: DC
  Administered 2018-08-27 – 2018-08-29 (×3): 40 mg via ORAL
  Filled 2018-08-26 (×3): qty 1

## 2018-08-26 MED ORDER — OLOPATADINE HCL 0.1 % OP SOLN
1.0000 [drp] | Freq: Two times a day (BID) | OPHTHALMIC | Status: DC
  Administered 2018-08-27 – 2018-08-29 (×5): 1 [drp] via OPHTHALMIC
  Filled 2018-08-26: qty 5

## 2018-08-26 MED ORDER — CARVEDILOL 12.5 MG PO TABS
12.5000 mg | ORAL_TABLET | Freq: Two times a day (BID) | ORAL | Status: DC
  Administered 2018-08-27 – 2018-08-29 (×6): 12.5 mg via ORAL
  Filled 2018-08-26 (×4): qty 1
  Filled 2018-08-26: qty 2
  Filled 2018-08-26: qty 1

## 2018-08-26 MED ORDER — POLYETHYLENE GLYCOL 3350 17 G PO PACK
17.0000 g | PACK | Freq: Two times a day (BID) | ORAL | Status: DC
  Administered 2018-08-26 – 2018-08-29 (×4): 17 g via ORAL
  Filled 2018-08-26 (×4): qty 1

## 2018-08-26 MED ORDER — CAPSAICIN 0.025 % EX CREA
TOPICAL_CREAM | Freq: Two times a day (BID) | CUTANEOUS | Status: DC
  Administered 2018-08-28 – 2018-08-29 (×2): via TOPICAL
  Filled 2018-08-26: qty 60

## 2018-08-26 MED ORDER — ROSUVASTATIN CALCIUM 20 MG PO TABS
20.0000 mg | ORAL_TABLET | Freq: Every day | ORAL | Status: DC
  Administered 2018-08-27 – 2018-08-29 (×3): 20 mg via ORAL
  Filled 2018-08-26 (×3): qty 1

## 2018-08-26 MED ORDER — POLYVINYL ALCOHOL 1.4 % OP SOLN
1.0000 [drp] | Freq: Three times a day (TID) | OPHTHALMIC | Status: DC
  Administered 2018-08-27 – 2018-08-29 (×7): 1 [drp] via OPHTHALMIC
  Filled 2018-08-26: qty 15

## 2018-08-26 MED ORDER — LOSARTAN POTASSIUM 50 MG PO TABS
50.0000 mg | ORAL_TABLET | Freq: Every day | ORAL | Status: DC
  Administered 2018-08-27 – 2018-08-29 (×3): 50 mg via ORAL
  Filled 2018-08-26 (×3): qty 1

## 2018-08-26 MED ORDER — ASPIRIN EC 325 MG PO TBEC
325.0000 mg | DELAYED_RELEASE_TABLET | Freq: Every day | ORAL | Status: DC
  Administered 2018-08-27 – 2018-08-28 (×2): 325 mg via ORAL
  Filled 2018-08-26 (×2): qty 1

## 2018-08-26 MED ORDER — ACETAMINOPHEN 500 MG PO TABS: 500.0000 mg | ORAL_TABLET | Freq: Four times a day (QID) | ORAL | Status: DC | PRN

## 2018-08-26 MED ORDER — ONDANSETRON HCL 4 MG/2ML IJ SOLN: 4.0000 mg | Freq: Four times a day (QID) | INTRAMUSCULAR | Status: DC | PRN

## 2018-08-26 NOTE — ED Provider Notes (Signed)
Patient placed in Quick Look pathway, seen and evaluated   Chief Complaint: right chest/flank pain  HPI: Per EMS, pt complaining of right flank/chest pain radiating to axilla.  Pain is worse with movement and palpation.  Associated nausea.  Pain started last night, waking her out of her sleep.  Pain is described as stabbing.  Denies shortness of breath, diaphoresis, syncope.  12 lead normal per EMS. Positive cardiac history. Hx dementia. Pt endorses constipation for multiple weeks. Denies urinary sx.  ROS: + chest pain, + abd pain, + nausea  Physical Exam:   Gen: No distress  Neuro: Awake and Alert  Skin: Warm    Focused Exam: b/l lower abdominal tenderness, RUQ tenderness. No peritoneal signs. Lungs CTAB. Heart RRR.   Initiation of care has begun. The patient has been counseled on the process, plan, and necessity for staying for the completion/evaluation, and the remainder of the medical screening examination    Nhu Glasby, [place] N, PA-C 08/26/18 1644    Fredia Sorrow, MD 09/02/18 1807

## 2018-08-26 NOTE — ED Provider Notes (Signed)
[facility] EMERGENCY DEPARTMENT Provider Note   CSN: [id number] Arrival date & time: 08/26/18  1631     History   Chief Complaint No chief complaint on file.   HPI [name] is a 84 y.o. female.  84 year old female presents with complaint of chest pain.  Patient reports atypical chest discomfort.  She reports pain to the right chest wall.  This pain is been ongoing all day today.  She denies associated shortness of breath, nausea, vomiting, diaphoresis, fever, cough, or other acute complaint.  The history is provided by the patient and medical records.  Chest Pain   This is a new problem. The current episode started 6 to 12 hours ago. The problem occurs rarely. The problem has been resolved. The pain is associated with movement. The pain is present in the lateral region. The pain is mild. The quality of the pain is described as heavy. The pain does not radiate. Duration of episode(s) is 5 hours.    Past Medical History:  Diagnosis Date  . Allergy   . Anemia    Iron def anemia  . Arthritis   . Asthma   . Breast cancer ([place]) 10/2010   Invasive Ductal Carcinoma, s/p bilateral mastectomy, now on Tamoxifen. Followed by Dr Jamse Arn.   . Breast cancer ([place]) 2011   s/p Bilateral masectomy  . CAD (coronary artery disease)   . Carotid artery aneurysm ([place]) 08/2010   right ICA, 5 x 39mm  . CHF (congestive heart failure) (HCC)    Systolic   . Colon cancer ([place])   . Colon cancer ([place]) 12/11/2013  . DDD (degenerative disc disease), cervical   . Diabetes mellitus   . Diverticulosis   . GERD (gastroesophageal reflux disease)   . H/O: GI bleed   . Hiatal hernia   . History of colon cancer   . Hypercalcemia 06/09/2014  . Hyperlipidemia   . Hypertension   . Myocardial infarction ([place])   . Neuropathy   . Pneumonia 03/2012  . Shortness of breath     Patient Active Problem List   Diagnosis Date Noted  . Atypical chest pain 08/26/2018  . Advanced nonexudative  age-related macular degeneration of both eyes without subfoveal involvement 07/30/2018  . Altered mental status 06/06/2018  . Microcytic anemia   . TIA (transient ischemic attack) 06/03/2018  . Mobility impaired 04/26/2018  . Protein-calorie malnutrition, severe 02/06/2017  . Urinary tract infection without hematuria 02/03/2017  . Hypokalemia 10/13/2016  . Pressure injury of skin 08/31/2016  . Pulmonary embolus ([place]) 08/30/2016  . Dementia without behavioral disturbance ([place]) 06/01/2016  . Depression 01/20/2016  . Suicidal ideation 01/19/2016  . Prolapse of female pelvic organs 12/28/2015  . Leg weakness, bilateral 12/08/2015  . Weakness of right lower extremity 11/25/2015  . Dry eyes 10/28/2015  . (HFpEF) heart failure with preserved ejection fraction ([place])   . Gout 05/14/2015  . Delirium secondary to multiple medical problems 04/15/2015  . Primary hypertension   . Seasonal allergies 02/10/2015  . Stroke-like symptoms   . Stroke-like episode ([place]) 01/25/2015  . Syncope and collapse 01/25/2015  . Dizziness 09/19/2014  . Anemia of chronic illness 08/19/2014  . Acute superficial venous thrombosis of right lower extremity 08/19/2014  . Hypercalcemia 06/09/2014  . Breast cancer, left breast ([place]) 06/09/2014  . Breast cancer, right breast ([place]) 06/09/2014  . Edema of left lower extremity 03/30/2014  . PAD (peripheral artery disease) ([place]) 03/30/2014  . History of colon cancer 12/11/2013  .  History of fall 05/15/2013  . Stricture and stenosis of esophagus 10/27/2012  . Thoracic back pain 05/24/2012  . Tobacco use disorder 06/02/2011  . Hiatal hernia 03/22/2011  . 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 IDA (iron deficiency anemia) 04/29/2007  . Controlled type 2 diabetes mellitus with diabetic neuropathy, without long-term current use of insulin ([place]) 01/10/2007  . Coronary atherosclerosis 01/10/2007  . Asthma 01/10/2007  . Reflux esophagitis 01/10/2007  . Osteoarthritis,  multiple sites 01/10/2007    Past Surgical History:  Procedure Laterality Date  . Breast masectomy  10/2010   Bilateral masectomy by Dr Lucia Gaskins s/p invasive ductal carcinoma.  Marland Kitchen BREAST SURGERY    . Cardiac  Catherization  2007   Severe 3-vessel disease.  EF 20-25%.  . ESOPHAGOGASTRODUODENOSCOPY  2001   Esophageal Tear  . ESOPHAGOGASTRODUODENOSCOPY  10/27/2012   Procedure: ESOPHAGOGASTRODUODENOSCOPY (EGD);  Surgeon: Irene Shipper, MD;  Location[facility] ENDOSCOPY;  Service: Endoscopy;  Laterality: N/A;  pat  . JOINT REPLACEMENT Right 2001   Knee  . TOTAL KNEE ARTHROPLASTY  2001     OB History   None      Home Medications    Prior to Admission medications   Medication Sig Start Date End Date Taking? Authorizing Provider  acetaminophen (TYLENOL) 500 MG tablet Take 500 mg by mouth every 6 (six) hours as needed for moderate pain.     [provider]  albuterol (PROVENTIL HFA;VENTOLIN HFA) 108 (90 BASE) MCG/ACT inhaler Inhale 2 puffs into the lungs every 6 (six) hours as needed. For shortness of breath Patient taking differently: Inhale 2 puffs into the lungs every 6 (six) hours as needed for shortness of breath.  01/18/15   [name], MD  Amino Acids-Protein Hydrolys (FEEDING SUPPLEMENT, PRO-STAT SUGAR FREE 64,) LIQD Take 30 mLs by mouth 2 (two) times daily with a meal. ensure    [provider]  amLODipine (NORVASC) 5 MG tablet Take 1 tablet (5 mg total) by mouth daily. 07/23/18   [place] Bing, DO  aspirin EC 325 MG tablet Take 1 tablet (325 mg total) by mouth daily. 07/05/18   [place] Bing, DO  capsaicin (ZOSTRIX) 0.025 % cream APPLY TO AFFECTED AREA TWICE A DAY 04/11/16   [name], MD  carvedilol (COREG) 12.5 MG tablet TAKE 1 TABLET BY MOUTH TWICE A DAY WITH A MEAL 07/23/18   [place] Bing, DO  guaifenesin (ROBITUSSIN) 100 MG/5ML syrup Take 10 mLs (200 mg total) by mouth 3 (three) times daily as needed for cough. 08/15/16   [name], MD    hydroxypropyl methylcellulose / hypromellose (ISOPTO TEARS / GONIOVISC) 2.5 % ophthalmic solution Place 1 drop 3 (three) times daily into both eyes. 09/28/17   [place] Bing, DO  loratadine (CLARITIN) 10 MG tablet TAKE 1/2 TABLET BY MOUTH DAILY Patient taking differently: Take 5 mg by mouth once a day as needed for allergies 12/08/15   [name], MD  losartan (COZAAR) 50 MG tablet Take 1 tablet (50 mg total) by mouth daily. 07/23/18   [place] Bing, DO  metFORMIN (GLUCOPHAGE) 500 MG tablet Take 1 tablet (500 mg total) by mouth daily with breakfast. 07/31/17   [place] Bing, DO  pantoprazole (PROTONIX) 40 MG tablet TAKE 1 TABLET BY MOUTH EVERY DAY 08/24/18   [place] Bing, DO  PATADAY 0.2 % SOLN APPLY 1 DROP TO EYE DAILY. 11/23/15   [name], MD  petrolatum-hydrophilic-aloe vera (ALOE VESTA) ointment Apply  topically 2 (two) times daily as needed for wound care. 05/10/17   Tasia Catchings, Amy V, PA-C  phenazopyridine (PYRIDIUM) 200 MG tablet Take 1 tablet (200 mg total) by mouth 3 (three) times daily as needed for pain. 08/08/18   [place] Bing, DO  ranitidine (ZANTAC) 150 MG tablet TAKE 1 TABLET BY MOUTH TWICE A DAY 07/29/18   [place] Bing, DO  rosuvastatin (CRESTOR) 20 MG tablet TAKE 1 TABLET BY MOUTH EVERYDAY AT BEDTIME 01/18/18    Bing, DO  senna-docusate (SENOKOT-S) 8.6-50 MG tablet Take 2 tablets by mouth at bedtime. Patient taking differently: Take 1 tablet by mouth every other day.  04/23/17   [place]ia Crews, MD  silver sulfADIAZINE (SILVADENE) 1 % cream Apply 1 application topically daily. 05/15/17   Tuchman, Richard C, DPM  cetirizine (ZYRTEC) 5 MG tablet Take 1 tablet (5 mg total) by mouth daily. Take at night time as it can cause some drowsiness 05/24/12 07/09/12  Losq, Burnell Blanks, MD    Family History Family History  Problem Relation Age of Onset  . Sickle cell anemia Other   . Heart disease Mother   . Heart disease Father   . Cancer Daughter 50        breast ca  . Hypertension Daughter   . Cancer Daughter 110       breast ca  . Hypertension Daughter   . Heart disease Son        Poor circulation-Left Leg  . Diabetes Daughter   . Hypertension Daughter   . Hyperlipidemia Daughter        Poor circulation- Toe amputation    Social History Social History   Tobacco Use  . Smoking status: Never Smoker  . Smokeless tobacco: Current User    Types: Snuff  Substance Use Topics  . Alcohol use: No  . Drug use: No     Allergies   Tape and Lisinopril   Review of Systems Review of Systems  Cardiovascular: Positive for chest pain.  All other systems reviewed and are negative.    Physical Exam Updated Vital Signs BP (!) 140/58   Pulse 78   Temp 98.7 F (37.1 C) (Oral)   Resp 20   SpO2 100%   Physical Exam  Constitutional: She is oriented to person, place, and time. She appears well-developed and well-nourished. No distress.  HENT:  Head: Normocephalic and atraumatic.  Mouth/Throat: Oropharynx is clear and moist.  Eyes: Pupils are equal, round, and reactive to light. Conjunctivae and EOM are normal.  Neck: Normal range of motion. Neck supple.  Cardiovascular: Normal rate, regular rhythm and normal heart sounds.  Mild tenderness with palpation to the right lateral chest wall.  Pulmonary/Chest: Effort normal and breath sounds normal. No respiratory distress.  Abdominal: Soft. She exhibits no distension. There is no tenderness.  Musculoskeletal: Normal range of motion. She exhibits no edema or deformity.  Neurological: She is alert and oriented to person, place, and time.  Skin: Skin is warm and dry.  Psychiatric: She has a normal mood and affect.  Nursing note and vitals reviewed.    ED Treatments / Results  Labs (all labs ordered are listed, but only abnormal results are displayed) Labs Reviewed  CBC - Abnormal; Notable for the following components:      Result Value   Hemoglobin 10.5 (*)    HCT 34.4 (*)     MCV 73.2 (*)    MCH 22.3 (*)    RDW 16.0 (*)  All other components within normal limits  COMPREHENSIVE METABOLIC PANEL - Abnormal; Notable for the following components:   Potassium 2.7 (*)    Glucose, Bld 121 (*)    Calcium 10.4 (*)    Albumin 3.1 (*)    GFR calc non Af Amer 52 (*)    GFR calc Af Amer 60 (*)    All other components within normal limits  LIPASE, BLOOD  URINALYSIS, ROUTINE W REFLEX MICROSCOPIC  I-STAT TROPONIN, ED  I-STAT CG4 LACTIC ACID, ED    EKG EKG Interpretation  Date/Time:  Monday August 26 2018 16:38:42 EDT Ventricular Rate:  76 PR Interval:  174 QRS Duration: 72 QT Interval:  404 QTC Calculation: 454 R Axis:   -34 Text Interpretation:  Sinus rhythm with occasional Premature ventricular complexes Left axis deviation Moderate voltage criteria for LVH, may be normal variant Nonspecific T wave abnormality Abnormal ECG Confirmed by Dene Gentry [phone]) on 08/26/2018 7:24:50 PM   Radiology Dg Chest 2 View  Result Date: 08/26/2018 CLINICAL DATA:  Right flank and chest pain radiating to the axilla since last night with associated shortness of breath and diaphoresis. EXAM: CHEST - 2 VIEW COMPARISON:  04/04/2018 and 02/18/2018 FINDINGS: The heart size and mediastinal contours are stable. There is aortic atherosclerosis. The lungs are clear. There is no pleural effusion or pneumothorax. There are stable degenerative changes throughout the thoracolumbar spine. Small calcific density along the inferior right glenoid rim is unchanged. IMPRESSION: Stable chest.  No active cardiopulmonary process. Electronically Signed   By: Richardean Sale M.D.   On: 08/26/2018 17:25    Procedures Procedures (including critical care time)  Medications Ordered in ED Medications  potassium chloride 10 mEq in 100 mL IVPB (10 mEq Intravenous New Bag/Given 08/26/18 2029)     Initial Impression / Assessment and Plan / ED Course  I have reviewed the triage vital signs and the  nursing notes.  Pertinent labs & imaging results that were available during my care of the patient were reviewed by me and considered in my medical decision making (see chart for details).    MDM  Screen complete  Patient is presenting with atypical chest pain.  Initial troponin is reassuring.  Initial EKG is reassuring.  Patient found to have significant hypokalemia upon work-up.  Patient will be admitted for further work-up of her atypical chest pain and treatment of her hypokalemia.  Family medicine resident team is aware of case and will evaluate for admission.   Final Clinical Impressions(s) / ED Diagnoses   Final diagnoses:  Chest pain, unspecified type  Hypokalemia    ED Discharge Orders    None       Valarie Merino, MD 08/26/18 2038

## 2018-08-26 NOTE — H&P (Addendum)
[facility] Admission History and Physical Service Pager: [phone]  [name] Medical record number: [id number] Date of birth: [birth year] Age: 84 y.o. Gender: female  Primary Care Provider:  Bing, DO Consultants: None Code Status: DNI/DNR  Chief Complaint: Abdominal pain and right chest pain  Assessment and Plan: [name] is a 84 y.o. female presenting with right sided chest pain and constipation. PMH is significant for chronic iron deficiency anemia, breast cancer s/p bilateral mastectomy (2011), CAD, h/o TIA (05/2018), carotid artery aneurysm (right ICA), HFpEF, HTN, HLD, GERD, DM, h/o colon cancer (~2005).  Atypical Chest pain: Has chronic intermittent pain along mastectomy incision line relieved with Icey-hot, Capsaicin cream, and Ace-bandages. Complained of some "soreness" along right antero-latearl side of chest wall. Notes a history of chronic cough since her pneumonia in April 2019, treating with nightly Robitussin. No cough noted during exam. CXR negative, no fractures noted. Patient is hemodynamically stable, afebrile, and breathing comfortably on RA. Denies any chest pain now, just the soreness. Lungs clear throughout. She reports TTP on exam along right antero-lateral chest. No CP or SOB with exertion or at rest. Most likely MSK due to reported cough and soreness to palpation. Doubt PE due to stable vitals and normal WOB on RA, unlikely pleural effusion as lungs were clear throughout, and not likely rib fracture as no history of trauma or fall. Also doubt pneumonia due to lack of cough during exam, no fever, clear lungs on exam, no leukocytosis and negative CXR. Possible referred abdominal pain, although upper abdominal exam was benign and lipase WNL and only mildly tender in lower quadrants. Denies any urinary symptoms so UTI and pyelonephritis is less likely and nontender along entire back so less likely nephrolithiasis.  Could possibly be cardiac in origin due to history of CAD, however negative EKG and troponins thus far is reassuring. Will trend labs and provide symptomatic relief and consider further workup pending results. - admit to telemetry, attending Dr. McDiarmid - trend troponins - Repeat EKG in AM  - Continue home Capsaicin cream PRN - Begin Voltaren gel QID  - Follow up UA - Consider further work-up pending results  Constipation: chronic Admits to chronic constipation, relieved with the occasional "stool softener". Has not had a BM in 2 weeks. When she has a BM, stool is "small hard pellets". Notes she sometimes requests a stool softener but is not given one by daughter. On exam, patient is tender along lower abdomen and firm to palpation throughout. - Begin Senna qAM - Begin Miralax BID - Consider Enema if no improvement  Hypokalemia: K+ 2.7 (BL: 3.5). Received 68meQ IV K-CL in ED.  - Begin 40 mEq K-dur q4hrs x 2  - Repeat BMP in AM  Iron Deficiency Anemia: chronic Hemoglobin 10.5 (BL 10-11). Iron studies in July WNL. Patient has not been on iron supplementation. Was thought to be 2/2 to thalassemia at last hospitalization and recommended further workup as outpatient.   - Continue to monitor - Transfuse if <7 -  Consider outpatient work up for thalassemia vs beginning iron supplementation  Protein Calorie Malnutrition  Albumin 3.1. Notes diet consists primarily of soups and broth. Complains of being hungry and requesting more food but is denied it.  - Consult nutrition  - Heart healthy diet - Consider consulting SW for evaluation of possible neglect  HFpEF: chronic, stable Last EF 50-55% and G1DD. On Carvedilol 12.5mg  BID, Amlodipine 5mg  and Losartan 2mg  QD. On exam, lungs  clear throughout. Trace pitting edema in LE bilaterally. - continue home meds - Consider lasix of edema worsens  HTN:  Baseline BP: 130's/60's. BP at admission: 140's/60's. On home Losartan and Amlodipine  -  Continue home Losartan and Amlodipine  T2DM: well-controlled Last A1C (06/04/18) 6.7. Metformin discontinued in August due to risk > benefits. Diet controlled - Follow CBG - Consider SSI as indicated  HLD: chronic Continue home rosuvastatin 20mg  QD  GERD: chronic On home protonix - Continue home protonix QD  Tobacco Abuse: chronic Chronic and current tobacco chewer - Declines nicotine patch at this time  H/o Bilateral Breast Cancer and Colon Cancer: chronic, stable - f/u outpatient    FEN/GI: Protonix, Heart Healthy, K-dur 41mEq replaced Prophylaxis: Lovenox  Disposition: Home pending workup and improvement  History of Present Illness:  [name] is a 84 y.o. female presenting with RLQ abdomainal pain since yesterday and some right sided chest pain that started today.  Patient notes she has a chronic history of constipation. Has not had a BM for 2 weeks which is not uncommon for her. She often treats with a "stool softener" as Miralax does not work. She notes that when she does have a bowel movement, the stools are "hard round pellets" and it hurts to go. However, denies pain a the cause for her inability to go. She admits to requesting a stool softener from her daughter but is often denied them. Pain in RLQ began yesterday morning and has remained constant.  Sharp right sided chest pain that is located along her mastectomy incision line that started today. The pain was constant but self resolved. She notes she has this pain often but today it was more intense. She often treats this pain with OTC tylenol, Icey-hot, and Capsaicin cream, which tends to help. She also notes that they sometimes wraps her chest with an ace-bandage and the pressure from that helps relieve the pain. Today she did not treat the pain with anything except tylenol as she did not "have any more of the other creams". She was brought into the ED today as the pain seemed more "intense".  Patient denies any current  chest pain or SOB. She does admit to some soreness along her "rib" on the right lower lateral side of her chest that hurts when she touches it. She denies any trauma or falls. Admits to some SOB and dizziness when laying down flat, currently sleeps on two pillows due to her known heart failure. Finally, she notes she's had a chronic productive cough of white sputum and some runny nose that has occurred since she was treated for pneumonia in April. She treats with nightly Robitussin.   Denies fever, chills, nausea, vomiting, SOB or CP with exertion, light headedness, headaches, changes in vision, weakness.   In the ED, patient was hemodynamically stable, afebrile, with 100% O2 sats on RA. She denied any current chest pain, just the continued soreness and RLQ abdominal pain. CXR negative for cardiopulmonary disease and negative troponin x 1. No leukocytosis noted on CBC. Lipase WNL. Hgb 10.5 (BL 10-11), Albumin 3.1, K+ 2.7. Lactic acid WNL.  Review Of Systems: Per HPI with the following additions:  Review of Systems  Constitutional: Positive for fever. Negative for chills and diaphoresis.  HENT: Positive for congestion. Negative for sore throat.   Eyes: Negative for blurred vision, discharge and redness.  Respiratory: Positive for cough and sputum production. Negative for hemoptysis and shortness of breath.   Cardiovascular: Positive for chest  pain, orthopnea and leg swelling. Negative for palpitations.  Gastrointestinal: Positive for abdominal pain and constipation. Negative for diarrhea, nausea and vomiting.  Genitourinary: Negative for dysuria.  Neurological: Positive for dizziness. Negative for headaches.    Patient Active Problem List   Diagnosis Date Noted  . Advanced nonexudative age-related macular degeneration of both eyes without subfoveal involvement 07/30/2018  . Altered mental status 06/06/2018  . Microcytic anemia   . TIA (transient ischemic attack) 06/03/2018  . Mobility  impaired 04/26/2018  . Protein-calorie malnutrition, severe 02/06/2017  . Urinary tract infection without hematuria 02/03/2017  . Hypokalemia 10/13/2016  . Pressure injury of skin 08/31/2016  . Pulmonary embolus ([place]) 08/30/2016  . Dementia without behavioral disturbance ([place]) 06/01/2016  . Depression 01/20/2016  . Suicidal ideation 01/19/2016  . Prolapse of female pelvic organs 12/28/2015  . Leg weakness, bilateral 12/08/2015  . Weakness of right lower extremity 11/25/2015  . Dry eyes 10/28/2015  . (HFpEF) heart failure with preserved ejection fraction ([place])   . Gout 05/14/2015  . Delirium secondary to multiple medical problems 04/15/2015  . Primary hypertension   . Seasonal allergies 02/10/2015  . Stroke-like symptoms   . Stroke-like episode ([place]) 01/25/2015  . Syncope and collapse 01/25/2015  . Dizziness 09/19/2014  . Anemia of chronic illness 08/19/2014  . Acute superficial venous thrombosis of right lower extremity 08/19/2014  . Hypercalcemia 06/09/2014  . Breast cancer, left breast ([place]) 06/09/2014  . Breast cancer, right breast ([place]) 06/09/2014  . Edema of left lower extremity 03/30/2014  . PAD (peripheral artery disease) ([place]) 03/30/2014  . History of colon cancer 12/11/2013  . History of fall 05/15/2013  . Stricture and stenosis of esophagus 10/27/2012  . Thoracic back pain 05/24/2012  . Tobacco use disorder 06/02/2011  . Hiatal hernia 03/22/2011  . 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 IDA (iron deficiency anemia) 04/29/2007  . Controlled type 2 diabetes mellitus with diabetic neuropathy, without long-term current use of insulin ([place]) 01/10/2007  . Coronary atherosclerosis 01/10/2007  . Asthma 01/10/2007  . Reflux esophagitis 01/10/2007  . Osteoarthritis, multiple sites 01/10/2007    Past Medical History: Past Medical History:  Diagnosis Date  . Allergy   . Anemia    Iron def anemia  . Arthritis   . Asthma   . Breast cancer ([place]) 10/2010    Invasive Ductal Carcinoma, s/p bilateral mastectomy, now on Tamoxifen. Followed by Dr Jamse Arn.   . Breast cancer ([place]) 2011   s/p Bilateral masectomy  . CAD (coronary artery disease)   . Carotid artery aneurysm ([place]) 08/2010   right ICA, 5 x 24mm  . CHF (congestive heart failure) ([facility])    Systolic   . Colon cancer ([place])   . Colon cancer ([place]) 12/11/2013  . DDD (degenerative disc disease), cervical   . Diabetes mellitus   . Diverticulosis   . GERD (gastroesophageal reflux disease)   . H/O: GI bleed   . Hiatal hernia   . History of colon cancer   . Hypercalcemia 06/09/2014  . Hyperlipidemia   . Hypertension   . Myocardial infarction ([place])   . Neuropathy   . Pneumonia 03/2012  . Shortness of breath     Past Surgical History: Past Surgical History:  Procedure Laterality Date  . Breast masectomy  10/2010   Bilateral masectomy by Dr Lucia Gaskins s/p invasive ductal carcinoma.  Marland Kitchen BREAST SURGERY    . Cardiac  Catherization  2007   Severe 3-vessel disease.  EF 20-25%.  . ESOPHAGOGASTRODUODENOSCOPY  2001  Esophageal Tear  . ESOPHAGOGASTRODUODENOSCOPY  10/27/2012   Procedure: ESOPHAGOGASTRODUODENOSCOPY (EGD);  Surgeon: Irene Shipper, MD;  [facility];  Service: Endoscopy;  Laterality: N/A;  pat  . JOINT REPLACEMENT Right 2001   Knee  . TOTAL KNEE ARTHROPLASTY  2001    Social History: Social History   Tobacco Use  . Smoking status: Never Smoker  . Smokeless tobacco: Current User    Types: Snuff  Substance Use Topics  . Alcohol use: No  . Drug use: No   Additional social history: Chews tobacco daily  Please also refer to relevant sections of EMR.  Family History: Family History  Problem Relation Age of Onset  . Sickle cell anemia Other   . Heart disease Mother   . Heart disease Father   . Cancer Daughter 31       breast ca  . Hypertension Daughter   . Cancer Daughter 62       breast ca  . Hypertension Daughter   . Heart disease Son        Poor circulation-Left  Leg  . Diabetes Daughter   . Hypertension Daughter   . Hyperlipidemia Daughter        Poor circulation- Toe amputation    Allergies and Medications: Allergies  Allergen Reactions  . Tape Other (See Comments)    Skin is very thin and will tear and bruise easily!! Skin is very thin and will tear and bruise easily!!  . Lisinopril Cough   No current facility-administered medications on file prior to encounter.    Current Outpatient Medications on File Prior to Encounter  Medication Sig Dispense Refill  . acetaminophen (TYLENOL) 500 MG tablet Take 500 mg by mouth every 6 (six) hours as needed for moderate pain.     Marland Kitchen albuterol (PROVENTIL HFA;VENTOLIN HFA) 108 (90 BASE) MCG/ACT inhaler Inhale 2 puffs into the lungs every 6 (six) hours as needed. For shortness of breath (Patient taking differently: Inhale 2 puffs into the lungs every 6 (six) hours as needed for shortness of breath. ) 1 Inhaler 6  . Amino Acids-Protein Hydrolys (FEEDING SUPPLEMENT, PRO-STAT SUGAR FREE 64,) LIQD Take 30 mLs by mouth 2 (two) times daily with a meal. ensure    . amLODipine (NORVASC) 5 MG tablet Take 1 tablet (5 mg total) by mouth daily. 90 tablet 3  . aspirin EC 325 MG tablet Take 1 tablet (325 mg total) by mouth daily. 90 tablet 3  . capsaicin (ZOSTRIX) 0.025 % cream APPLY TO AFFECTED AREA TWICE A DAY 60 g 1  . carvedilol (COREG) 12.5 MG tablet TAKE 1 TABLET BY MOUTH TWICE A DAY WITH A MEAL 90 tablet 3  . guaifenesin (ROBITUSSIN) 100 MG/5ML syrup Take 10 mLs (200 mg total) by mouth 3 (three) times daily as needed for cough. 120 mL 0  . hydroxypropyl methylcellulose / hypromellose (ISOPTO TEARS / GONIOVISC) 2.5 % ophthalmic solution Place 1 drop 3 (three) times daily into both eyes. 15 mL 12  . loratadine (CLARITIN) 10 MG tablet TAKE 1/2 TABLET BY MOUTH DAILY (Patient taking differently: Take 5 mg by mouth once a day as needed for allergies) 15 tablet 0  . losartan (COZAAR) 50 MG tablet Take 1 tablet (50 mg total)  by mouth daily. 90 tablet 3  . metFORMIN (GLUCOPHAGE) 500 MG tablet Take 1 tablet (500 mg total) by mouth daily with breakfast. 90 tablet 3  . pantoprazole (PROTONIX) 40 MG tablet TAKE 1 TABLET BY MOUTH EVERY DAY 30 tablet  0  . PATADAY 0.2 % SOLN APPLY 1 DROP TO EYE DAILY. 2.5 mL 0  . petrolatum-hydrophilic-aloe vera (ALOE VESTA) ointment Apply topically 2 (two) times daily as needed for wound care. 226 g 0  . phenazopyridine (PYRIDIUM) 200 MG tablet Take 1 tablet (200 mg total) by mouth 3 (three) times daily as needed for pain. 10 tablet 0  . ranitidine (ZANTAC) 150 MG tablet TAKE 1 TABLET BY MOUTH TWICE A DAY 180 tablet 1  . rosuvastatin (CRESTOR) 20 MG tablet TAKE 1 TABLET BY MOUTH EVERYDAY AT BEDTIME 30 tablet 6  . senna-docusate (SENOKOT-S) 8.6-50 MG tablet Take 2 tablets by mouth at bedtime. (Patient taking differently: Take 1 tablet by mouth every other day. ) 60 tablet 3  . silver sulfADIAZINE (SILVADENE) 1 % cream Apply 1 application topically daily. 50 g 0  . [DISCONTINUED] cetirizine (ZYRTEC) 5 MG tablet Take 1 tablet (5 mg total) by mouth daily. Take at night time as it can cause some drowsiness 30 tablet 4    Objective: BP (!) 140/58   Pulse 78   Temp 98.7 F (37.1 C) (Oral)   Resp 20   SpO2 100%  Physical Exam:  Gen: NAD, alert, non-toxic, well-appearing, lying comfortably and in good spirits Skin: Warm and dry. No obvious rashes, lesions, or trauma.  masectomy scar on chest MSK: reproducible pain to chest wall pressure along masectomy scar HEENT: NCAT No conjunctival pallor or injection. No scleral icterus or injection.  MMM.  CV: RRR.   <2s capillary refill bilaterally.  RP & DPs 2+ bilaterally. 1+ pitting edema bilaterally (R>L). Resp: CTAB.  No wheezing, rales, abnormal lung sounds.  No increased WOB Abd: TTP along right and left lower abdomen. NTTP along upper abdomen bilaterally. Firm to palpation.  Positive bowel sounds. Psych: Cooperative with exam. Pleasant. Makes  eye contact. Speech normal. Extremities: Moves all extremities spontaneously   Labs and Imaging: CBC BMET  Recent Labs  Lab 08/26/18 1649  WBC 4.8  HGB 10.5*  HCT 34.4*  PLT 178   Recent Labs  Lab 08/26/18 1649  NA 139  K 2.7*  CL 109  CO2 24  BUN 9  CREATININE 0.93  GLUCOSE 121*  CALCIUM 10.4*     Dg Chest 2 View  Result Date: 08/26/2018 CLINICAL DATA:  Right flank and chest pain radiating to the axilla since last night with associated shortness of breath and diaphoresis. EXAM: CHEST - 2 VIEW COMPARISON:  04/04/2018 and 02/18/2018 FINDINGS: The heart size and mediastinal contours are stable. There is aortic atherosclerosis. The lungs are clear. There is no pleural effusion or pneumothorax. There are stable degenerative changes throughout the thoracolumbar spine. Small calcific density along the inferior right glenoid rim is unchanged. IMPRESSION: Stable chest.  No active cardiopulmonary process. Electronically Signed   By: Richardean Sale M.D.   On: 08/26/2018 17:25   Danna Hefty, DO 08/26/2018, 7:54 PM PGY-1, [place] Intern pager: [phone], text pages welcome  FPTS Upper-Level Resident Addendum   I have independently interviewed and examined the patient. I have discussed the above with the original author and agree with their documentation. My edits for correction/addition/clarification are in blue. Please see also any attending notes.    Sherene Sires, DO PGY-2, [facility] 08/26/2018 11:05 PM  FPTS Service pager: [phone] (text pages welcome through [facility])

## 2018-08-26 NOTE — ED Triage Notes (Signed)
Per [facility], Pt reports stabbing R flank pain radiating to R chest since last night. Pt reports nausea. Pt denies SHOB, diaphoresis, or syncope. Pt denies recent fall.

## 2018-08-27 ENCOUNTER — Encounter (HOSPITAL_COMMUNITY): Payer: Self-pay | Admitting: Family Medicine

## 2018-08-27 DIAGNOSIS — I517 Cardiomegaly: Secondary | ICD-10-CM | POA: Diagnosis present

## 2018-08-27 DIAGNOSIS — L89622 Pressure ulcer of left heel, stage 2: Secondary | ICD-10-CM

## 2018-08-27 DIAGNOSIS — I5032 Chronic diastolic (congestive) heart failure: Secondary | ICD-10-CM | POA: Diagnosis not present

## 2018-08-27 DIAGNOSIS — D509 Iron deficiency anemia, unspecified: Secondary | ICD-10-CM | POA: Diagnosis not present

## 2018-08-27 DIAGNOSIS — E1140 Type 2 diabetes mellitus with diabetic neuropathy, unspecified: Secondary | ICD-10-CM

## 2018-08-27 DIAGNOSIS — K5900 Constipation, unspecified: Secondary | ICD-10-CM | POA: Diagnosis not present

## 2018-08-27 DIAGNOSIS — E785 Hyperlipidemia, unspecified: Secondary | ICD-10-CM | POA: Diagnosis not present

## 2018-08-27 DIAGNOSIS — E876 Hypokalemia: Secondary | ICD-10-CM | POA: Diagnosis not present

## 2018-08-27 DIAGNOSIS — I10 Essential (primary) hypertension: Secondary | ICD-10-CM

## 2018-08-27 DIAGNOSIS — G8918 Other acute postprocedural pain: Secondary | ICD-10-CM | POA: Diagnosis not present

## 2018-08-27 DIAGNOSIS — R0789 Other chest pain: Secondary | ICD-10-CM | POA: Diagnosis not present

## 2018-08-27 DIAGNOSIS — Z6822 Body mass index (BMI) 22.0-22.9, adult: Secondary | ICD-10-CM | POA: Diagnosis not present

## 2018-08-27 DIAGNOSIS — R8271 Bacteriuria: Secondary | ICD-10-CM | POA: Diagnosis not present

## 2018-08-27 DIAGNOSIS — I110 Hypertensive heart disease with heart failure: Secondary | ICD-10-CM | POA: Diagnosis not present

## 2018-08-27 DIAGNOSIS — E441 Mild protein-calorie malnutrition: Secondary | ICD-10-CM | POA: Diagnosis not present

## 2018-08-27 LAB — URINALYSIS, ROUTINE W REFLEX MICROSCOPIC
Bilirubin Urine: NEGATIVE
Glucose, UA: NEGATIVE mg/dL
Ketones, ur: NEGATIVE mg/dL
Nitrite: NEGATIVE
PH: 7 (ref 5.0–8.0)
Protein, ur: 30 mg/dL — AB
Specific Gravity, Urine: 1.006 (ref 1.005–1.030)

## 2018-08-27 LAB — BASIC METABOLIC PANEL
Anion gap: 10 (ref 5–15)
BUN: 7 mg/dL — ABNORMAL LOW (ref 8–23)
CHLORIDE: 110 mmol/L (ref 98–111)
CO2: 23 mmol/L (ref 22–32)
Calcium: 10.6 mg/dL — ABNORMAL HIGH (ref 8.9–10.3)
Creatinine, Ser: 0.88 mg/dL (ref 0.44–1.00)
GFR, EST NON AFRICAN AMERICAN: 55 mL/min — AB (ref >60)
Glucose, Bld: 111 mg/dL — ABNORMAL HIGH (ref 70–99)
POTASSIUM: 3.6 mmol/L (ref 3.5–5.1)
SODIUM: 143 mmol/L (ref 135–145)

## 2018-08-27 LAB — TROPONIN I

## 2018-08-27 LAB — CBC
HEMATOCRIT: 35.4 % — AB (ref 36.0–46.0)
HEMOGLOBIN: 10.3 g/dL — AB (ref 12.0–15.0)
MCH: 21.7 pg — ABNORMAL LOW (ref 26.0–34.0)
MCHC: 29.1 g/dL — ABNORMAL LOW (ref 30.0–36.0)
MCV: 74.5 fL — ABNORMAL LOW (ref 80.0–100.0)
NRBC: 0 % (ref 0.0–0.2)
Platelets: 192 10*3/uL (ref 150–400)
RBC: 4.75 MIL/uL (ref 3.87–5.11)
RDW: 16.3 % — AB (ref 11.5–15.5)
WBC: 4.1 10*3/uL (ref 4.0–10.5)

## 2018-08-27 LAB — MAGNESIUM: Magnesium: 1.8 mg/dL (ref 1.7–2.4)

## 2018-08-27 MED ORDER — ENSURE ENLIVE PO LIQD
237.0000 mL | Freq: Two times a day (BID) | ORAL | Status: DC
  Administered 2018-08-28 – 2018-08-29 (×2): 237 mL via ORAL

## 2018-08-27 NOTE — Evaluation (Signed)
Physical Therapy Evaluation Patient Details Name: [name] MRN: [id number] DOB: 1924-01-13 Today's Date: 08/27/2018   History of Present Illness  Pt adm with atypical chest pain and constipation. PMH - chf, cad, dm, htn, breast CA with bil mastectomy, neuropathy, colon CA  Clinical Impression  Pt presents to PT requiring assist for all mobility OOB. Per prior notes from previous visits pt has been nonambulatory for quite some time but has been transferring to w/c. At prior visits it was reported pt had aide coming in part time and that daughter was assisting at times. Today pt is denying that she has any help at home. Recommend SNF unless pt does indeed have assist for mobility at home.     Follow Up Recommendations SNF;Supervision/Assistance - 24 hour(unless pt has assist at home)    Equipment Recommendations  None recommended by PT    Recommendations for Other Services       Precautions / Restrictions Precautions Precautions: Fall Restrictions Weight Bearing Restrictions: No      Mobility  Bed Mobility Overal bed mobility: Needs Assistance Bed Mobility: Supine to Sit     Supine to sit: Supervision     General bed mobility comments: Incr time but no physical assist needed  Transfers Overall transfer level: Needs assistance Equipment used: 1 person hand held assist Transfers: Sit to/from Stand;Stand Pivot Transfers Sit to Stand: Mod assist Stand pivot transfers: Mod assist       General transfer comment: Assist to bring hips up and for balance. Small pivotal steps from bed to recliner  Ambulation/Gait                Stairs            Wheelchair Mobility    Modified Rankin (Stroke Patients Only)       Balance                                             Pertinent Vitals/Pain Pain Assessment: No/denies pain    Home Living Family/patient expects to be discharged to:: Private residence     Type of Home: House Home  Access: Stairs to enter     Home Layout: One level Home Equipment: Wheelchair - manual;Bedside commode      Prior Function Level of Independence: Needs assistance   Gait / Transfers Assistance Needed: Pt reports some of the time she can get to w/c on her own. When asked if aide or daughter helps her she states no one helps her.           Hand Dominance   Dominant Hand: Right    Extremity/Trunk Assessment   Upper Extremity Assessment Upper Extremity Assessment: Defer to OT evaluation    Lower Extremity Assessment Lower Extremity Assessment: RLE deficits/detail;LLE deficits/detail RLE Deficits / Details: grossly 3-/5 LLE Deficits / Details: grossly 3-/5       Communication   Communication: HOH  Cognition Arousal/Alertness: Awake/alert Behavior During Therapy: WFL for tasks assessed/performed Overall Cognitive Status: No family/caregiver present to determine baseline cognitive functioning                                 General Comments: Unsure of accuracy of pt's report of prior level of function.      General Comments  Exercises     Assessment/Plan    PT Assessment Patient needs continued PT services  PT Problem List Decreased strength;Decreased activity tolerance;Decreased balance;Decreased mobility       PT Treatment Interventions DME instruction;Functional mobility training;Therapeutic activities;Therapeutic exercise;Balance training;Patient/family education    PT Goals (Current goals can be found in the Care Plan section)  Acute Rehab PT Goals Patient Stated Goal: go to rehab PT Goal Formulation: With patient Time For Goal Achievement: 09/10/18 Potential to Achieve Goals: Fair    Frequency Min 3X/week   Barriers to discharge Decreased caregiver support Unsure of support at home    Co-evaluation               AM-PAC PT "6 Clicks" Daily Activity  Outcome Measure Difficulty turning over in bed (including adjusting  bedclothes, sheets and blankets)?: A Little Difficulty moving from lying on back to sitting on the side of the bed? : A Little Difficulty sitting down on and standing up from a chair with arms (e.g., wheelchair, bedside commode, etc,.)?: Unable Help needed moving to and from a bed to chair (including a wheelchair)?: A Lot Help needed walking in hospital room?: Total Help needed climbing 3-5 steps with a railing? : Total 6 Click Score: 11    End of Session   Activity Tolerance: Patient tolerated treatment well Patient left: in chair;with call bell/phone within reach;with chair alarm set Nurse Communication: Mobility status PT Visit Diagnosis: Other abnormalities of gait and mobility (R26.89)    Time: 1029-1050 PT Time Calculation (min) (ACUTE ONLY): 21 min   Charges:   PT Evaluation $PT Eval Moderate Complexity: [place] Pager [phone] Office [place] 08/27/2018, 12:35 PM

## 2018-08-27 NOTE — Care Management Note (Signed)
Case Management Note  Patient Details  Name: [name] MRN: [id number] Date of Birth: 01/22/1924  Subjective/Objective:    Atypical Chest Pain               Action/Plan: Patient lives at home; Primary Care Provider: [place] Bing, DO; has private insurance with Medicare; use SCAT / CAPS and a personal care service - nurses aide to assist in the home; noted Physical Therapy recommendation for SNF at discharge; CM will continue to follow for progression of care.  Expected Discharge Date:     Unknown at this time             Expected Discharge Plan:   Possibly SNF  Status of Service:   In progress  Sherrilyn Rist [phone] 08/27/2018, 3:55 PM

## 2018-08-27 NOTE — Consult Note (Signed)
[facility] CM Inpatient Consult   08/27/2018  [name] Jan 14, 1924 [id number]  Patient was assessed for [place] [facility] for community services. Patient was previously active with [place] [facility].  Met with patient at bedside regarding being restarted with [facility] services. Patient has an active consent on file and has recently had [facility] social worker involved. Patient approved for this writer to call her daughter, [name].  Spoke with [name] [phone] and she states she was trying to find the [facility] social workers number.  Information was given. [name] states her mother received personal care services from [place] M-F 3 hours and Sat/Sun 2.5 hours.  She states that she had sent the information for a ramp.  She consents to ongoing follow up. She states she gives the patient her medications and did not have any problems affording or getting them.  She states she wasn't sure about sending her mom on SCAT without her being able to be with her for the appointment and for MD instructions or medication changes. She states, "MY mom will not remember all of the information." Will have the community social worker follow up.  Of note, [facility] Care Management services does not replace or interfere with any services that are arranged by inpatient case management or social work. For additional questions or referrals please contact:  Natividad Brood, RN BSN [facility] Liaison  [phone] business mobile phone Toll free office [phone]
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
## 2018-08-27 NOTE — Progress Notes (Signed)
Initial Nutrition Assessment  DOCUMENTATION CODES:   Not applicable  INTERVENTION:  Ensure Enlive BID; each supplement provides 350 kcal and 20 grams protein. Patient prefers vanilla flavor.   NUTRITION DIAGNOSIS:   Inadequate oral intake related to decreased appetite as evidenced by meal completion < 50%.  GOAL:   Patient will meet greater than or equal to 90% of their needs  MONITOR:   PO intake, Supplement acceptance  REASON FOR ASSESSMENT:   Consult Assessment of nutrition requirement/status  ASSESSMENT:   [name] is a 84 yo female with PMH of breast cancer s/p bilateral masectomy (2011), CAD, iron deficiency anemia, HTN, type 2 diabetes controlled, dementia admitted for post-masectomy pain.  Student dietitian visited patient in room today. She reports appetite currently "good". Patient noted to have 25% meal completion this morning.   She says that she lives with [name] and also has a CNA that comes in the morning 5 days a week, and another CNA on the weekend. Per her report her daughter doesn't work and is "also sick" with diabetes.   She says that her typical intake is fried liver pudding, bacon, eggs and milk coffee for breakfast. For lunch she has soup and for dinner has soup with beans. Beverages include water and ginger ale. She reports drinking Ensure "one and a half a day"; prefers vanilla flavor.   Per chart she complains of being hungry and is denied food. Patient did not report this during assessment and states that she "has plenty to eat". However, patient appears to be slightly confused and forgetful; has a hx of dementia.   Per her report she has chronic constipation. She says that her daughter will give her a stool softener "sometimes". During visit patient noted to have stool accident while in the chair. Assisted her with using her call bell.   She denies unintentional weight loss. Says her usual body weight is 115 lbs, consistent with wt during  10/8 office visit. Wt 10/14 was 105 lbs.   Medications reviewed and include: Miralax, senna, Protonix Labs reviewed: CBG 131-165    NUTRITION - FOCUSED PHYSICAL EXAM:    Most Recent Value  Orbital Region  No depletion  Upper Arm Region  No depletion  Thoracic and Lumbar Region  No depletion  Buccal Region  No depletion  Temple Region  Mild depletion  Clavicle Bone Region  No depletion  Clavicle and Acromion Bone Region  No depletion  Scapular Bone Region  No depletion  Dorsal Hand  No depletion  Patellar Region  Unable to assess  Anterior Thigh Region  Unable to assess  Posterior Calf Region  Unable to assess  Edema (RD Assessment)  Unable to assess  Hair  Reviewed  Eyes  Reviewed  Mouth  Reviewed [poor dentition,  uses dentures]  Skin  Reviewed  Nails  Reviewed      Diet Order:   Diet Order            Diet Heart Room service appropriate? Yes; Fluid consistency: Thin  Diet effective now              EDUCATION NEEDS:   Not appropriate for education at this time  Skin:  Skin Assessment: Reviewed RN Assessment  Last BM:  10/15  Height:   Ht Readings from Last 1 Encounters:  08/26/18 4\' 10"  (1.473 m)    Weight:   Wt Readings from Last 1 Encounters:  08/26/18 48.1 kg    Ideal Body Weight:  BMI:  Body mass index is 22.16 kg/m.  Estimated Nutritional Needs:   Kcal:  1200-1400  Protein:  60-70  Fluid:  >/=1.2 L   [name], Dietetic Intern [phone]
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
## 2018-08-28 DIAGNOSIS — E876 Hypokalemia: Secondary | ICD-10-CM | POA: Diagnosis not present

## 2018-08-28 DIAGNOSIS — E1140 Type 2 diabetes mellitus with diabetic neuropathy, unspecified: Secondary | ICD-10-CM | POA: Diagnosis not present

## 2018-08-28 DIAGNOSIS — R0789 Other chest pain: Principal | ICD-10-CM

## 2018-08-28 DIAGNOSIS — G8918 Other acute postprocedural pain: Secondary | ICD-10-CM | POA: Diagnosis not present

## 2018-08-28 DIAGNOSIS — D509 Iron deficiency anemia, unspecified: Secondary | ICD-10-CM | POA: Diagnosis not present

## 2018-08-28 DIAGNOSIS — E43 Unspecified severe protein-calorie malnutrition: Secondary | ICD-10-CM | POA: Diagnosis not present

## 2018-08-28 LAB — BASIC METABOLIC PANEL
ANION GAP: 5 (ref 5–15)
BUN: 9 mg/dL (ref 8–23)
CHLORIDE: 110 mmol/L (ref 98–111)
CO2: 26 mmol/L (ref 22–32)
CREATININE: 1.06 mg/dL — AB (ref 0.44–1.00)
Calcium: 10.9 mg/dL — ABNORMAL HIGH (ref 8.9–10.3)
GFR calc non Af Amer: 44 mL/min — ABNORMAL LOW (ref >60)
GFR, EST AFRICAN AMERICAN: 51 mL/min — AB (ref >60)
Glucose, Bld: 112 mg/dL — ABNORMAL HIGH (ref 70–99)
Potassium: 3.8 mmol/L (ref 3.5–5.1)
SODIUM: 141 mmol/L (ref 135–145)

## 2018-08-28 LAB — CBC
HCT: 33 % — ABNORMAL LOW (ref 36.0–46.0)
Hemoglobin: 10.1 g/dL — ABNORMAL LOW (ref 12.0–15.0)
MCH: 22.4 pg — ABNORMAL LOW (ref 26.0–34.0)
MCHC: 30.6 g/dL (ref 30.0–36.0)
MCV: 73.2 fL — ABNORMAL LOW (ref 80.0–100.0)
NRBC: 0 % (ref 0.0–0.2)
Platelets: 173 10*3/uL (ref 150–400)
RBC: 4.51 MIL/uL (ref 3.87–5.11)
RDW: 16.3 % — AB (ref 11.5–15.5)
WBC: 4.1 10*3/uL (ref 4.0–10.5)

## 2018-08-28 MED ORDER — ASPIRIN 325 MG PO TABS
325.0000 mg | ORAL_TABLET | Freq: Every day | ORAL | Status: DC
  Administered 2018-08-28 – 2018-08-29 (×2): 325 mg via ORAL
  Filled 2018-08-28 (×2): qty 1
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
## 2018-08-28 NOTE — Progress Notes (Addendum)
Physical Therapy Treatment Patient Details Name: [name] MRN: [id number] DOB: May 31, 1924 Today's Date: 08/28/2018    History of Present Illness Pt adm with atypical chest pain and constipation. PMH - chf, cad, dm, htn, breast CA with bil mastectomy, neuropathy, colon CA    PT Comments    Patient maintaining functional mobility. Performing transfers with up to moderate assistance from bed to and from bedside commode and then to recliner. Complaining of dizziness once sitting EOB, BP 93/66, but increased back to 136/53, HR 69 bpm once seated in recliner. Presenting with cognitive impairments including decreased short term memory and ability to problem solve; requiring multimodal cueing for safety. Will need 24/7 supervision and could benefit from [facility] rehab or [place] in order to maximize functional independence.     Follow Up Recommendations  SNF;Supervision/Assistance - 24 hour (would [facility] also be a possibility?)     Equipment Recommendations  None recommended by PT    Recommendations for Other Services       Precautions / Restrictions Precautions Precautions: Fall Restrictions Weight Bearing Restrictions: No    Mobility  Bed Mobility Overal bed mobility: Needs Assistance Bed Mobility: Supine to Sit     Supine to sit: Supervision     General bed mobility comments: Incr time but no physical assist needed  Transfers Overall transfer level: Needs assistance Equipment used: 1 person hand held assist Transfers: Sit to/from [employer] Sit to Stand: Min assist   Squat pivot transfers: Mod assist     General transfer comment: Patient unable to achieve upright posture with significant trunk flexion. Requiring minimal assistance to clear hips from bed and moderate assistance to guide hips and steady transferring from bed <> BSC and then bed > recliner. Patient reaching for chair arm for external support. Provided directional  cueing  Ambulation/Gait                 Stairs             Wheelchair Mobility    Modified Rankin (Stroke Patients Only)       Balance Overall balance assessment: Needs assistance Sitting-balance support: No upper extremity supported;Feet supported Sitting balance-Leahy Scale: Good     Standing balance support: Single extremity supported;During functional activity Standing balance-Leahy Scale: Poor                              Cognition Arousal/Alertness: Awake/alert Behavior During Therapy: WFL for tasks assessed/performed Overall Cognitive Status: No family/caregiver present to determine baseline cognitive functioning                                 General Comments: Pt presenting with decreased short term memory and decreased problem solving abilities.       Exercises      General Comments        Pertinent Vitals/Pain Pain Assessment: No/denies pain    Home Living                      Prior Function            PT Goals (current goals can now be found in the care plan section) Acute Rehab PT Goals Patient Stated Goal: go to rehab PT Goal Formulation: With patient Time For Goal Achievement: 09/10/18 Potential to Achieve Goals: Fair Progress towards PT goals:  Progressing toward goals    Frequency    Min 3X/week      PT Plan Current plan remains appropriate    Co-evaluation              AM-PAC PT "6 Clicks" Daily Activity  Outcome Measure  Difficulty turning over in bed (including adjusting bedclothes, sheets and blankets)?: A Little Difficulty moving from lying on back to sitting on the side of the bed? : A Little Difficulty sitting down on and standing up from a chair with arms (e.g., wheelchair, bedside commode, etc,.)?: Unable Help needed moving to and from a bed to chair (including a wheelchair)?: A Lot Help needed walking in hospital room?: Total Help needed climbing 3-5 steps with  a railing? : Total 6 Click Score: 11    End of Session Equipment Utilized During Treatment: Gait belt Activity Tolerance: Patient tolerated treatment well Patient left: in chair;with call bell/phone within reach;with chair alarm set Nurse Communication: Mobility status PT Visit Diagnosis: Other abnormalities of gait and mobility (R26.89)     Time: 3335-4562 PT Time Calculation (min) (ACUTE ONLY): 21 min  Charges:  $Therapeutic Activity: 8-22 mins                     [name], PT, DPT Acute Rehabilitation Services Pager [phone] Office [phone]    [name] 08/28/2018, 10:01 AM

## 2018-08-28 NOTE — Care Management Note (Addendum)
Case Management Note Previous Note Created by Olga Coaster  Patient Details  Name: [name] MRN: [id number] Date of Birth: September 14, 1924  Subjective/Objective:    Atypical Chest Pain               Action/Plan: Patient lives at home; Primary Care Provider: Walton Bing, DO; has private insurance with Medicare; use SCAT / CAPS and a personal care service - nurses aide to assist in the home; noted Physical Therapy recommendation for SNF at discharge; CM will continue to follow for progression of care.  Expected Discharge Date:     Unknown at this time             Expected Discharge Plan:   Possibly SNF  Status of Service:   In progress  Maryclare Labrador, RN,08/28/2018, 9:37 AM  Update: Pt has active medicaid - CM discussed option of placement with medicaid and obs with CSW - CSW informed CM that she would inquire regarding clarity with medicaid coverage in obs stay for SNF as it is possible that SNF placement can now be obtained.    [facility] program if unable to go to SNF - pt prefers SNF but will accept [facility] if she has to return home  CM informed by CSW that pt can not discharge to SNF due to insurance constraints - CM reached out to CSW AD to ensure all avenues are explored prior to pt discharging home with [facility].  CM also provided referral to [facility] program this am.     Pt medically clear for discharge per attending - CSW notified this am.  Discharge plan for SNF

## 2018-08-28 NOTE — NC FL2 (Signed)
[place] LEVEL OF CARE SCREENING TOOL     IDENTIFICATION  [name] Birthdate: 12/27/1923 Sex: female Admission Date (Current Location): 08/26/2018  [facility] and [place] Number:  Herbalist and Address:  [facility] [place] [address] [place], [place] [address]      Provider Number: [id number]  Attending Physician Name and Address:  McDiarmid, Blane Ohara, MD  Relative Name and Phone Number:  [name], [phone]    Current Level of Care: Hospital Recommended Level of Care: [place] Prior Approval Number:    Date Approved/Denied:   PASRR Number: [id number]  Discharge Plan: SNF    Current Diagnoses: Patient Active Problem List   Diagnosis Date Noted  . Hypomagnesemia 08/27/2018  . Left ventricular hypertrophy by electrocardiogram 08/27/2018  . Atypical chest pain 08/26/2018  . Advanced nonexudative age-related macular degeneration of both eyes without subfoveal involvement 07/30/2018  . Altered mental status 06/06/2018  . Microcytic anemia   . TIA (transient ischemic attack) 06/03/2018  . Mobility impaired 04/26/2018  . Protein-calorie malnutrition, severe 02/06/2017  . Urinary tract infection without hematuria 02/03/2017  . Hypokalemia 10/13/2016  . Pressure injury of heel, stage 2 ([place]) 08/31/2016  . Pulmonary embolus ([place]) 08/30/2016  . Dementia without behavioral disturbance ([place]) 06/01/2016  . Depression 01/20/2016  . Suicidal ideation 01/19/2016  . Prolapse of female pelvic organs 12/28/2015  . Leg weakness, bilateral 12/08/2015  . Weakness of right lower extremity 11/25/2015  . Dry eyes 10/28/2015  . (HFpEF) heart failure with preserved ejection fraction ([place])   . Gout 05/14/2015  . Delirium secondary to multiple medical problems 04/15/2015  . Primary hypertension   . Seasonal allergies 02/10/2015  . Stroke-like symptoms   . Stroke-like episode ([place]) 01/25/2015  .  Syncope and collapse 01/25/2015  . Dizziness 09/19/2014  . Anemia of chronic illness 08/19/2014  . Acute superficial venous thrombosis of right lower extremity 08/19/2014  . Hypercalcemia 06/09/2014  . Breast cancer, left breast ([place]) 06/09/2014  . Breast cancer, right breast ([place]) 06/09/2014  . Edema of left lower extremity 03/30/2014  . PAD (peripheral artery disease) ([place]) 03/30/2014  . History of colon cancer 12/11/2013  . History of fall 05/15/2013  . Stricture and stenosis of esophagus 10/27/2012  . Thoracic back pain 05/24/2012  . Tobacco use disorder 06/02/2011  . Post-mastectomy pain 05/02/2011  . Hiatal hernia 03/22/2011  . 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 IDA (iron deficiency anemia) 04/29/2007  . Controlled type 2 diabetes mellitus with diabetic neuropathy, without long-term current use of insulin ([place]) 01/10/2007  . Coronary atherosclerosis 01/10/2007  . Asthma 01/10/2007  . Reflux esophagitis 01/10/2007  . Osteoarthritis, multiple sites 01/10/2007    Orientation RESPIRATION BLADDER Height & Weight     Time, Self, Place  Normal External catheter, Continent Weight: 106 lb 0.7 oz (48.1 kg) Height:  4\' 10"  (147.3 cm)  BEHAVIORAL SYMPTOMS/MOOD NEUROLOGICAL BOWEL NUTRITION STATUS      Incontinent Diet(heart healthy)  AMBULATORY STATUS COMMUNICATION OF NEEDS Skin   Limited Assist Verbally                         Personal Care Assistance Level of Assistance  Bathing, Feeding, Dressing Bathing Assistance: Limited assistance Feeding assistance: Independent Dressing Assistance: Limited assistance     Functional Limitations Info  Sight, Hearing, Speech Sight Info: Adequate Hearing Info: Adequate Speech Info: Adequate    SPECIAL CARE FACTORS FREQUENCY  PT (By licensed PT), OT (By licensed OT)     PT Frequency: 5x wk OT Frequency: 5x wk            Contractures Contractures Info: Not present    Additional Factors Info  Code Status,  Allergies Code Status Info: DNR Allergies Info: TAPE, LISINOPRIL           Current Medications (08/28/2018):  This is the current hospital active medication list Current Facility-Administered Medications  Medication Dose Route Frequency Provider Last Rate Last Dose  . acetaminophen (TYLENOL) tablet 500 mg  500 mg Oral Q6H PRN Guadalupe Dawn, MD      . amLODipine (NORVASC) tablet 5 mg  5 mg Oral Daily Guadalupe Dawn, MD   5 mg at 08/27/18 1050  . aspirin EC tablet 325 mg  325 mg Oral Daily Guadalupe Dawn, MD   325 mg at 08/27/18 1050  . capsaicin (ZOSTRIX) 0.025 % cream   Topical BID Guadalupe Dawn, MD      . carvedilol (COREG) tablet 12.5 mg  12.5 mg Oral BID WC Guadalupe Dawn, MD   12.5 mg at 08/28/18 4097  . diclofenac sodium (VOLTAREN) 1 % transdermal gel 2 g  2 g Topical QID Guadalupe Dawn, MD   2 g at 08/26/18 2348  . enoxaparin (LOVENOX) injection 30 mg  30 mg Subcutaneous Q24H Guadalupe Dawn, MD   30 mg at 08/27/18 1053  . feeding supplement (ENSURE ENLIVE) (ENSURE ENLIVE) liquid 237 mL  237 mL Oral BID BM McDiarmid, Blane Ohara, MD      . losartan (COZAAR) tablet 50 mg  50 mg Oral Daily Guadalupe Dawn, MD   50 mg at 08/27/18 1050  . olopatadine (PATANOL) 0.1 % ophthalmic solution 1 drop  1 drop Both Eyes BID Guadalupe Dawn, MD   1 drop at 08/27/18 2152  . ondansetron (ZOFRAN) injection 4 mg  4 mg Intravenous Q6H PRN Guadalupe Dawn, MD      . pantoprazole (PROTONIX) EC tablet 40 mg  40 mg Oral Daily Guadalupe Dawn, MD   40 mg at 08/27/18 1050  . polyethylene glycol (MIRALAX / GLYCOLAX) packet 17 g  17 g Oral BID Guadalupe Dawn, MD   17 g at 08/27/18 1054  . polyvinyl alcohol (LIQUIFILM TEARS) 1.4 % ophthalmic solution 1 drop  1 drop Both Eyes TID Guadalupe Dawn, MD   1 drop at 08/27/18 2153  . rosuvastatin (CRESTOR) tablet 20 mg  20 mg Oral q1800 Guadalupe Dawn, MD   20 mg at 08/27/18 1840  . senna (SENOKOT) tablet 8.6 mg  1 tablet Oral Daily Guadalupe Dawn, MD   8.6 mg at  08/27/18 1050     Discharge Medications: Please see discharge summary for a list of discharge medications.  Relevant Imaging Results:  Relevant Lab Results:   Additional Information SS#: [id number]  Wende Neighbors, LCSW

## 2018-08-28 NOTE — Evaluation (Signed)
Occupational Therapy Evaluation and Discharge Patient Details Name: [name] MRN: [id number] DOB: 03-22-1924 Today's Date: 08/28/2018    History of Present Illness Pt adm with atypical chest pain and constipation. PMH - chf, cad, dm, htn, breast CA with bil mastectomy, neuropathy, colon CA   Clinical Impression   Pt transfers only from her bed to w/c or BSC. She is dependent in bathing, dressing and toileting. She can self feed and participates in grooming in sitting. Pt is currently functioning at her baseline. No further OT needs.    Follow Up Recommendations  Supervision/Assistance - 24 hour    Equipment Recommendations  None recommended by OT    Recommendations for Other Services       Precautions / Restrictions Precautions Precautions: Fall Restrictions Weight Bearing Restrictions: No      Mobility Bed Mobility Overal bed mobility: Needs Assistance Bed Mobility: Supine to Sit;Sit to Supine     Supine to sit: Supervision Sit to supine: Min assist   General bed mobility comments: use of rails, increased time, min assist for LEs back into bed  Transfers Overall transfer level: Needs assistance Equipment used: 1 person hand held assist Transfers: Sit to/from [employer] Sit to Stand: Min assist   Squat pivot transfers: Mod assist     General transfer comment: flexed posture    Balance Overall balance assessment: Needs assistance Sitting-balance support: No upper extremity supported;Feet supported Sitting balance-Leahy Scale: Good     Standing balance support: Single extremity supported;During functional activity Standing balance-Leahy Scale: Zero                             ADL either performed or assessed with clinical judgement   ADL Overall ADL's : At baseline                                             Vision Patient Visual Report: No change from baseline       Perception     Praxis       Pertinent Vitals/Pain Pain Assessment: No/denies pain     Hand Dominance Right   Extremity/Trunk Assessment Upper Extremity Assessment Upper Extremity Assessment: Generalized weakness   Lower Extremity Assessment Lower Extremity Assessment: Defer to PT evaluation       Communication Communication Communication: HOH   Cognition Arousal/Alertness: Awake/alert Behavior During Therapy: WFL for tasks assessed/performed Overall Cognitive Status: No family/caregiver present to determine baseline cognitive functioning                                 General Comments: some memory deficits, no family available to confirm information   General Comments       Exercises     Shoulder Instructions      Home Living Family/patient expects to be discharged to:: Private residence Living Arrangements: Children Available Help at Discharge: Family;Personal care attendant;Available PRN/intermittently Type of Home: House Home Access: Stairs to enter     Home Layout: One level     Bathroom Shower/Tub: (sponge bathes)   Bathroom Toilet: Standard     Home Equipment: Wheelchair - manual;Bedside commode   Additional Comments: personal care attendant comes 6 days a week      Prior Functioning/Environment Level of [place]: Needs assistance  Gait / Transfers Assistance Needed: pt reports she has not walked in 1 year, can sometimes get on and off BSC, but can't manage pants ADL's / Standard Needed: pt self feeds and performs grooming with set up, dependent in bathing, dressing and toileting   Comments: pt reports mostly staying in the bed all day        OT Problem List: Decreased strength      OT Treatment/Interventions:      OT Goals(Current goals can be found in the care plan section) Acute Rehab OT Goals Patient Stated Goal: to do whatever her children tell her  OT Frequency:     Barriers to D/C:            Co-evaluation               AM-PAC PT "6 Clicks" Daily Activity     Outcome Measure Help from another person eating meals?: None Help from another person taking care of personal grooming?: A Little Help from another person toileting, which includes using toliet, bedpan, or urinal?: A Lot Help from another person bathing (including washing, rinsing, drying)?: A Lot Help from another person to put on and taking off regular upper body clothing?: A Lot Help from another person to put on and taking off regular lower body clothing?: Total 6 Click Score: 14   End of Session Equipment Utilized During Treatment: Gait belt  Activity Tolerance: Patient tolerated treatment well Patient left: in bed;with call bell/phone within reach;with bed alarm set  OT Visit Diagnosis: Unsteadiness on feet (R26.81)                Time: 9758-8325 OT Time Calculation (min): 28 min Charges:  OT General Charges $OT Visit: 1 Visit OT Evaluation $OT Eval Moderate Complexity: 1 Mod OT Treatments $Self Care/Home Management : 8-22 mins  Nestor Lewandowsky, OTR/L Acute Rehabilitation Services Pager: [phone] Office: [phone]  [name] 08/28/2018, 11:32 AM

## 2018-08-28 NOTE — Discharge Instructions (Signed)
Continue to use your capsacin cream for your pain at your scar.  You should also continue to take Senna and Miralax every day to keep your bowels moving well.

## 2018-08-29 DIAGNOSIS — R5381 Other malaise: Secondary | ICD-10-CM | POA: Diagnosis not present

## 2018-08-29 DIAGNOSIS — R404 Transient alteration of awareness: Secondary | ICD-10-CM | POA: Diagnosis not present

## 2018-08-29 DIAGNOSIS — F0390 Unspecified dementia without behavioral disturbance: Secondary | ICD-10-CM | POA: Diagnosis not present

## 2018-08-29 DIAGNOSIS — E876 Hypokalemia: Secondary | ICD-10-CM | POA: Diagnosis not present

## 2018-08-29 DIAGNOSIS — K5900 Constipation, unspecified: Secondary | ICD-10-CM | POA: Diagnosis not present

## 2018-08-29 DIAGNOSIS — E1141 Type 2 diabetes mellitus with diabetic mononeuropathy: Secondary | ICD-10-CM | POA: Diagnosis not present

## 2018-08-29 DIAGNOSIS — D509 Iron deficiency anemia, unspecified: Secondary | ICD-10-CM | POA: Diagnosis not present

## 2018-08-29 DIAGNOSIS — G459 Transient cerebral ischemic attack, unspecified: Secondary | ICD-10-CM | POA: Diagnosis not present

## 2018-08-29 DIAGNOSIS — Z6822 Body mass index (BMI) 22.0-22.9, adult: Secondary | ICD-10-CM | POA: Diagnosis not present

## 2018-08-29 DIAGNOSIS — R4182 Altered mental status, unspecified: Secondary | ICD-10-CM | POA: Diagnosis not present

## 2018-08-29 DIAGNOSIS — M546 Pain in thoracic spine: Secondary | ICD-10-CM | POA: Diagnosis not present

## 2018-08-29 DIAGNOSIS — J690 Pneumonitis due to inhalation of food and vomit: Secondary | ICD-10-CM | POA: Diagnosis not present

## 2018-08-29 DIAGNOSIS — R0789 Other chest pain: Secondary | ICD-10-CM | POA: Diagnosis not present

## 2018-08-29 DIAGNOSIS — K449 Diaphragmatic hernia without obstruction or gangrene: Secondary | ICD-10-CM | POA: Diagnosis not present

## 2018-08-29 DIAGNOSIS — I517 Cardiomegaly: Secondary | ICD-10-CM | POA: Diagnosis not present

## 2018-08-29 DIAGNOSIS — H04129 Dry eye syndrome of unspecified lacrimal gland: Secondary | ICD-10-CM | POA: Diagnosis not present

## 2018-08-29 DIAGNOSIS — C50912 Malignant neoplasm of unspecified site of left female breast: Secondary | ICD-10-CM | POA: Diagnosis not present

## 2018-08-29 DIAGNOSIS — I10 Essential (primary) hypertension: Secondary | ICD-10-CM | POA: Diagnosis not present

## 2018-08-29 DIAGNOSIS — I959 Hypotension, unspecified: Secondary | ICD-10-CM | POA: Diagnosis not present

## 2018-08-29 DIAGNOSIS — I2699 Other pulmonary embolism without acute cor pulmonale: Secondary | ICD-10-CM | POA: Diagnosis not present

## 2018-08-29 DIAGNOSIS — I5030 Unspecified diastolic (congestive) heart failure: Secondary | ICD-10-CM | POA: Diagnosis not present

## 2018-08-29 DIAGNOSIS — R45851 Suicidal ideations: Secondary | ICD-10-CM | POA: Diagnosis not present

## 2018-08-29 DIAGNOSIS — M545 Low back pain: Secondary | ICD-10-CM | POA: Diagnosis not present

## 2018-08-29 DIAGNOSIS — M109 Gout, unspecified: Secondary | ICD-10-CM | POA: Diagnosis not present

## 2018-08-29 DIAGNOSIS — Z7401 Bed confinement status: Secondary | ICD-10-CM | POA: Diagnosis not present

## 2018-08-29 DIAGNOSIS — K210 Gastro-esophageal reflux disease with esophagitis: Secondary | ICD-10-CM | POA: Diagnosis not present

## 2018-08-29 DIAGNOSIS — R112 Nausea with vomiting, unspecified: Secondary | ICD-10-CM | POA: Diagnosis not present

## 2018-08-29 DIAGNOSIS — I5032 Chronic diastolic (congestive) heart failure: Secondary | ICD-10-CM | POA: Diagnosis not present

## 2018-08-29 DIAGNOSIS — M159 Polyosteoarthritis, unspecified: Secondary | ICD-10-CM | POA: Diagnosis not present

## 2018-08-29 DIAGNOSIS — E441 Mild protein-calorie malnutrition: Secondary | ICD-10-CM | POA: Diagnosis not present

## 2018-08-29 DIAGNOSIS — E785 Hyperlipidemia, unspecified: Secondary | ICD-10-CM | POA: Diagnosis not present

## 2018-08-29 DIAGNOSIS — J45998 Other asthma: Secondary | ICD-10-CM | POA: Diagnosis not present

## 2018-08-29 DIAGNOSIS — M2550 Pain in unspecified joint: Secondary | ICD-10-CM | POA: Diagnosis not present

## 2018-08-29 DIAGNOSIS — R079 Chest pain, unspecified: Secondary | ICD-10-CM | POA: Diagnosis not present

## 2018-08-29 DIAGNOSIS — F339 Major depressive disorder, recurrent, unspecified: Secondary | ICD-10-CM | POA: Diagnosis not present

## 2018-08-29 DIAGNOSIS — L89622 Pressure ulcer of left heel, stage 2: Secondary | ICD-10-CM | POA: Diagnosis not present

## 2018-08-29 DIAGNOSIS — E119 Type 2 diabetes mellitus without complications: Secondary | ICD-10-CM | POA: Diagnosis not present

## 2018-08-29 DIAGNOSIS — S39012A Strain of muscle, fascia and tendon of lower back, initial encounter: Secondary | ICD-10-CM | POA: Diagnosis not present

## 2018-08-29 DIAGNOSIS — I2510 Atherosclerotic heart disease of native coronary artery without angina pectoris: Secondary | ICD-10-CM | POA: Diagnosis not present

## 2018-08-29 DIAGNOSIS — R05 Cough: Secondary | ICD-10-CM | POA: Diagnosis not present

## 2018-08-29 DIAGNOSIS — N819 Female genital prolapse, unspecified: Secondary | ICD-10-CM | POA: Diagnosis not present

## 2018-08-29 DIAGNOSIS — M6281 Muscle weakness (generalized): Secondary | ICD-10-CM | POA: Diagnosis not present

## 2018-08-29 DIAGNOSIS — I739 Peripheral vascular disease, unspecified: Secondary | ICD-10-CM | POA: Diagnosis not present

## 2018-08-29 DIAGNOSIS — E43 Unspecified severe protein-calorie malnutrition: Secondary | ICD-10-CM | POA: Diagnosis not present

## 2018-08-29 LAB — BASIC METABOLIC PANEL
ANION GAP: 6 (ref 5–15)
BUN: 8 mg/dL (ref 8–23)
CO2: 25 mmol/L (ref 22–32)
Calcium: 11 mg/dL — ABNORMAL HIGH (ref 8.9–10.3)
Chloride: 109 mmol/L (ref 98–111)
Creatinine, Ser: 1.02 mg/dL — ABNORMAL HIGH (ref 0.44–1.00)
GFR, EST AFRICAN AMERICAN: 54 mL/min — AB (ref >60)
GFR, EST NON AFRICAN AMERICAN: 46 mL/min — AB (ref >60)
Glucose, Bld: 121 mg/dL — ABNORMAL HIGH (ref 70–99)
Potassium: 3.8 mmol/L (ref 3.5–5.1)
SODIUM: 140 mmol/L (ref 135–145)

## 2018-08-29 LAB — CBC
HCT: 33.5 % — ABNORMAL LOW (ref 36.0–46.0)
Hemoglobin: 9.9 g/dL — ABNORMAL LOW (ref 12.0–15.0)
MCH: 21.5 pg — ABNORMAL LOW (ref 26.0–34.0)
MCHC: 29.6 g/dL — ABNORMAL LOW (ref 30.0–36.0)
MCV: 72.7 fL — AB (ref 80.0–100.0)
NRBC: 0 % (ref 0.0–0.2)
PLATELETS: 217 10*3/uL (ref 150–400)
RBC: 4.61 MIL/uL (ref 3.87–5.11)
RDW: 16.3 % — ABNORMAL HIGH (ref 11.5–15.5)
WBC: 3.9 10*3/uL — AB (ref 4.0–10.5)

## 2018-08-29 MED ORDER — DICLOFENAC SODIUM 1 % TD GEL: 2.0000 g | Freq: Four times a day (QID) | TRANSDERMAL | 0 refills | Status: AC

## 2018-08-29 NOTE — Care Management (Signed)
CM spoke with attending - discharge plan will be for SNF with discharge today to [facility]

## 2018-08-29 NOTE — Progress Notes (Addendum)
CSW reached out to patients daughters [name] via phone to talk to her about the SNF 3 Day Waiver. [name] stated she is agreeable with patient discharging to SNF for rehab. CSW stated to [facility] that [facility] is the only facility that accepted patient who were part of the program. [name] stated she would still be agreeable to that since facility is near her home. [name] stated she would be willing to sign paperwork on behalf of patient. [name] stated she received a phone call from [place] and is aware of the APS report. [name] stated that patient statement is false and she does take care of her and she is being cared for at home. CSW explained to [name] that DSS is only assisting to make sure patient is safe and has support at home. [name] stated she understands and will be there for patient.  10:15:  CSW contacted family medicine and spoke to [name] to make her aware of the 3 Day SNF Waiver and of APS involvement in patients care. Dr. Sandi [name] stated she had spoken to patient twice and per patient, patient would like to go back home. MD stated per patient, that [patient does feel safe going home and she is taken care of at home. MD stated she will release patient today and she will go home with home health. CSW will contact patients daughter again to make her aware of patients changed decision.  11:00am:  CSW spoke to patient at bedside to verify her decision. Patient stated she would like to go home but wants to talk to daughter to make sure decision is okay. CSW informed patient that she can go to rehab since patient qualifies for the SNF 3 day Waiver. Patient stated she would like to speak to [name] before making a final decision. CSW called [name] but she did not answer. CSW left voicemail for patients daughter to please contact CSW back  12:00:  CSW received phone call from [name] [facility]) and RNCM on floor stating they were in the room with patient and wanted to discuss discharge  plan again. CSW stated to patient, RNCM and [name] that per conversation with [name] this morning she is agreeable for patient to go to rehab as long as patient is wanting rehab via phone. CSW stated to [facility] and [facility] that patients daughter has not called back since patient stated she wanted to talk to daughter. Patient stated as long [name] is okay with her going to rehab she will be agreeable to go to SNF. RNCM stated she will reach out to MD and make them aware that patient will go to SNF upon discharge.     [name], MSW,  [place]

## 2018-08-29 NOTE — Progress Notes (Signed)
Discharged to [facility] by [facility] ambulance, belongings with pt. Paper works given to  Charity fundraiser. Called said facility for report but no one answering phone. To try calling back later again.

## 2018-08-29 NOTE — Consult Note (Addendum)
   [facility] CM Inpatient Consult   08/29/2018  [name] 1924-05-05 [id number]    [facility] Care Management follow up.  Went to bedside along with inpatient RNCM to discuss discharge plans with patient. Explained to [name] that she has the option and opportunity to go to SNF. [name] endorses that she would like to speak with her daughter. However, writer attempted to reach daughter without answer numerous times. Made [name] aware that inpatient LCSW has spoken to daughter and daughter was in agreement with SNF plans to [facility]. [name] states she is agreeable to going to SNF. Called inpatient LCSW on speaker phone so she could also hear patient's consent to SNF placement.   Inpatient RNCM text paged MD to make aware of patient's decision to go to SNF.   Appreciative of inpatient team collaboration.     Marthenia Rolling, MSN-Ed, RN,BSN [facility] Liaison [phone]

## 2018-08-29 NOTE — Progress Notes (Addendum)
Clinical Social Worker facilitated patient discharge including contacting patient family and facility to confirm patient discharge plans. CSW spoke to patients daughter [name] via phone and she is still agreeable for patient to go to [facility]. Clinical information faxed to facility and family agreeable with plan.  CSW arranged ambulance transport via [facility] to [facility] .  RN to call [phone] (pt will go in rm# [id number]) for report prior to discharge.  Clinical Social Worker will sign off for now as social work intervention is no longer needed. Please consult [place] again if new need arises.  Rhea Pink, MSW, [place]

## 2018-09-02 DIAGNOSIS — R05 Cough: Secondary | ICD-10-CM | POA: Diagnosis not present

## 2018-09-02 DIAGNOSIS — M6281 Muscle weakness (generalized): Secondary | ICD-10-CM | POA: Diagnosis not present

## 2018-09-02 DIAGNOSIS — R079 Chest pain, unspecified: Secondary | ICD-10-CM | POA: Diagnosis not present

## 2018-09-02 DIAGNOSIS — F0390 Unspecified dementia without behavioral disturbance: Secondary | ICD-10-CM | POA: Diagnosis not present

## 2018-09-02 DIAGNOSIS — I5032 Chronic diastolic (congestive) heart failure: Secondary | ICD-10-CM | POA: Diagnosis not present

## 2018-09-03 DIAGNOSIS — I2510 Atherosclerotic heart disease of native coronary artery without angina pectoris: Secondary | ICD-10-CM | POA: Diagnosis not present

## 2018-09-03 DIAGNOSIS — E119 Type 2 diabetes mellitus without complications: Secondary | ICD-10-CM | POA: Diagnosis not present

## 2018-09-03 DIAGNOSIS — K5900 Constipation, unspecified: Secondary | ICD-10-CM | POA: Diagnosis not present

## 2018-09-03 DIAGNOSIS — I10 Essential (primary) hypertension: Secondary | ICD-10-CM | POA: Diagnosis not present

## 2018-09-04 ENCOUNTER — Other Ambulatory Visit: Payer: Self-pay | Admitting: *Deleted

## 2018-09-04 ENCOUNTER — Inpatient Hospital Stay: Payer: Medicare Other | Admitting: Family Medicine

## 2018-09-04 NOTE — Patient Outreach (Signed)
[facility]) Care Management  09/04/2018  CHAMIA SCHMUTZ [birth year] [id number]  Onsite visit.  Met with Eugenie Birks, discharge planner. She reports that patient states her discharge plan is to go home with daughter. She is aware that APS is involved.  She reports that she mentioned in morning meeting that patient has Medicaid and if needed to be LTC could convert,this will be a wait and see.  Met with patient at bedside. Patient lying in bed, she states that she has been working out with therapy today and was having a little pain, taking some pain medications.  RNCM discussed [facility] Care management and left a packet. Patient states she never learned to read or write because she had to work growing up and only attended school when "it rained".  Patient states she will give packet to her daughter.  RNCM will follow up at next facility visit and monitor for any [facility] CM needs. Royetta Crochet. Laymond Purser, RN, BSN, [place] [phone]) Business Cell  [phone]) Toll Free Office

## 2018-09-05 DIAGNOSIS — M545 Low back pain: Secondary | ICD-10-CM | POA: Diagnosis not present

## 2018-09-05 DIAGNOSIS — F0390 Unspecified dementia without behavioral disturbance: Secondary | ICD-10-CM | POA: Diagnosis not present

## 2018-09-05 DIAGNOSIS — M6281 Muscle weakness (generalized): Secondary | ICD-10-CM | POA: Diagnosis not present

## 2018-09-05 DIAGNOSIS — S39012A Strain of muscle, fascia and tendon of lower back, initial encounter: Secondary | ICD-10-CM | POA: Diagnosis not present

## 2018-09-06 DIAGNOSIS — S39012A Strain of muscle, fascia and tendon of lower back, initial encounter: Secondary | ICD-10-CM | POA: Diagnosis not present

## 2018-09-06 DIAGNOSIS — M545 Low back pain: Secondary | ICD-10-CM | POA: Diagnosis not present

## 2018-09-06 DIAGNOSIS — M6281 Muscle weakness (generalized): Secondary | ICD-10-CM | POA: Diagnosis not present

## 2018-09-06 DIAGNOSIS — F0390 Unspecified dementia without behavioral disturbance: Secondary | ICD-10-CM | POA: Diagnosis not present

## 2018-09-07 ENCOUNTER — Other Ambulatory Visit: Payer: Self-pay | Admitting: Family Medicine

## 2018-09-10 ENCOUNTER — Inpatient Hospital Stay: Payer: Self-pay | Admitting: Family Medicine

## 2018-09-10 DIAGNOSIS — R05 Cough: Secondary | ICD-10-CM | POA: Diagnosis not present

## 2018-09-10 DIAGNOSIS — R4182 Altered mental status, unspecified: Secondary | ICD-10-CM | POA: Diagnosis not present

## 2018-09-10 DIAGNOSIS — R112 Nausea with vomiting, unspecified: Secondary | ICD-10-CM | POA: Diagnosis not present

## 2018-09-10 DIAGNOSIS — F0390 Unspecified dementia without behavioral disturbance: Secondary | ICD-10-CM | POA: Diagnosis not present

## 2018-09-10 DIAGNOSIS — R5381 Other malaise: Secondary | ICD-10-CM | POA: Diagnosis not present

## 2018-09-13 ENCOUNTER — Telehealth: Payer: Self-pay

## 2018-09-13 NOTE — Telephone Encounter (Signed)
Patient daughter, [name], called. Has questions regarding how patient ended up at [facility]. States she is not having any peace having to go back and forth and patient is not being taken care of well.  Call back is [phone]  Danley Danker, RN [facility] [place])

## 2018-09-17 ENCOUNTER — Other Ambulatory Visit: Payer: Self-pay | Admitting: Family Medicine

## 2018-09-18 ENCOUNTER — Other Ambulatory Visit: Payer: Self-pay | Admitting: *Deleted

## 2018-09-18 NOTE — Telephone Encounter (Signed)
Reached out to [facility] multiple complaints which include on professionalism from the staff (language), and frequent cleaning (patient frequently soiled when visited).  [name] has reached out to administration and facility Education officer, museum.  Social worker has yet to return call.  Patient did reach out to [place] work who recommended decision may be made by me.  I advised patient to contact facility social worker again and let administration know if she does not receive a call within 24 hours.  Otherwise, she is to call back [place] work to be considered for transfer to another facility where she can have appropriate care.  [name] in agreement with plan.  [name], [place], PGY-3

## 2018-09-18 NOTE — Patient Outreach (Signed)
[facility]) Care Management  09/18/2018  BYRON TIPPING [birth year] [id number]  Met with Marchelle Gearing at facility. She reports patient is doing well. She states patient did have some complaints about staff and the unit manager has spoken with both patient and her daughter.  She reports that patient does want to return home with daughter.  She is unsure if APS is still involved.  Met with patient, she was in the hallway outside her room.  RNCM reviewed [facility] Care management. Patient reports she makes her own decisions but will discuss information with her daughter, [name].  Left [facility] packet.  Plan to follow up at next facility visit.  Royetta Crochet. Laymond Purser, RN, BSN, [place] [phone]) Business Cell  [phone]) Toll Free Office

## 2018-09-18 NOTE — Telephone Encounter (Signed)
[name] Laming,  Ms. Eithel Ryall was discharged from our service on 08/29/2018 for an unrelated complaint.  There were social concerns particularly with patient being left alone at home frequently per patient's report.  Patient also reported feeling scared because of this.  She said she only ate soup and daughter refused to provide patient with stool softeners or other foods.  Prior to discharge, patient requested SNF placement and was sent to [place].  Patient's daughter who I do not believe has guardianship is complaining about having to travel frequently and feels that her mother is not being taking care of.  I am assuming we can touch with APS regarding this.  Bring this by Dr. Vikki Ports who is the attending on service and noticed Ms. Vanderkolk fairly well.  Do you have any other thoughts?  [name], [place], PGY-3

## 2018-09-25 ENCOUNTER — Other Ambulatory Visit: Payer: Self-pay | Admitting: *Deleted

## 2018-09-25 ENCOUNTER — Ambulatory Visit: Payer: Self-pay | Admitting: Family Medicine

## 2018-09-25 NOTE — Patient Outreach (Signed)
[facility]) Care Management  09/25/2018  NEVILLE PAULS 1924/06/06 [id number]   Met with Eugenie Birks, dcp at facility.  She reports patient doing well, they have a tentative discharge date of 10/03/18, patient has decided to go home with daughter, [name].  Met with patient at bedside, she said she had not looked at information left last week. RNCM reviewed [facility] care management and patient states she would like our services upon discharge.   Plan to refer to [facility] for transition of care and LCSW to assess for any resource needs and follow up as APS was possibly involved Verbal consent obtained.  Royetta Crochet. Laymond Purser, RN, BSN, [place] [phone]) Business Cell  [phone]) Toll Free Office

## 2018-09-27 ENCOUNTER — Other Ambulatory Visit: Payer: Self-pay | Admitting: Licensed Clinical Social Worker

## 2018-09-27 ENCOUNTER — Other Ambulatory Visit: Payer: Medicare Other | Admitting: Licensed Clinical Social Worker

## 2018-09-27 DIAGNOSIS — F0390 Unspecified dementia without behavioral disturbance: Secondary | ICD-10-CM | POA: Diagnosis not present

## 2018-09-27 DIAGNOSIS — D509 Iron deficiency anemia, unspecified: Secondary | ICD-10-CM | POA: Diagnosis not present

## 2018-09-27 DIAGNOSIS — E1141 Type 2 diabetes mellitus with diabetic mononeuropathy: Secondary | ICD-10-CM | POA: Diagnosis not present

## 2018-09-27 DIAGNOSIS — R079 Chest pain, unspecified: Secondary | ICD-10-CM | POA: Diagnosis not present

## 2018-09-27 DIAGNOSIS — I739 Peripheral vascular disease, unspecified: Secondary | ICD-10-CM | POA: Diagnosis not present

## 2018-09-27 NOTE — Patient Outreach (Signed)
[facility]) Care Management  [facility] Social Work  09/27/2018  [name] 1924/01/16 [id number]  Encounter Medications:  Outpatient Encounter Medications as of 09/27/2018  Medication Sig  . acetaminophen (TYLENOL) 500 MG tablet Take 500 mg by mouth every 6 (six) hours as needed for moderate pain.   Marland Kitchen albuterol (PROVENTIL HFA;VENTOLIN HFA) 108 (90 BASE) MCG/ACT inhaler Inhale 2 puffs into the lungs every 6 (six) hours as needed. For shortness of breath (Patient taking differently: Inhale 2 puffs into the lungs every 6 (six) hours as needed for shortness of breath. )  . Amino Acids-Protein Hydrolys (FEEDING SUPPLEMENT, PRO-STAT SUGAR FREE 64,) LIQD Take 30 mLs by mouth 2 (two) times daily with a meal. ensure  . amLODipine (NORVASC) 5 MG tablet Take 1 tablet (5 mg total) by mouth daily.  Marland Kitchen aspirin EC 325 MG tablet Take 1 tablet (325 mg total) by mouth daily.  . capsaicin (ZOSTRIX) 0.025 % cream APPLY TO AFFECTED AREA TWICE A DAY  . carvedilol (COREG) 12.5 MG tablet TAKE 1 TABLET BY MOUTH TWICE A DAY WITH A MEAL  . diclofenac sodium (VOLTAREN) 1 % GEL Apply 2 g topically 4 (four) times daily.  Marland Kitchen guaifenesin (ROBITUSSIN) 100 MG/5ML syrup Take 10 mLs (200 mg total) by mouth 3 (three) times daily as needed for cough.  . hydroxypropyl methylcellulose / hypromellose (ISOPTO TEARS / GONIOVISC) 2.5 % ophthalmic solution Place 1 drop 3 (three) times daily into both eyes.  Marland Kitchen loratadine (CLARITIN) 10 MG tablet TAKE 1/2 TABLET BY MOUTH DAILY (Patient taking differently: Take 5 mg by mouth once a day as needed for allergies)  . losartan (COZAAR) 50 MG tablet Take 1 tablet (50 mg total) by mouth daily.  . pantoprazole (PROTONIX) 40 MG tablet TAKE 1 TABLET BY MOUTH EVERY DAY  . PATADAY 0.2 % SOLN APPLY 1 DROP TO EYE DAILY.  Marland Kitchen phenazopyridine (PYRIDIUM) 200 MG tablet Take 1 tablet (200 mg total) by mouth 3 (three) times daily as needed for pain.  . ranitidine (ZANTAC) 150 MG tablet TAKE 1  TABLET BY MOUTH TWICE A DAY  . rosuvastatin (CRESTOR) 20 MG tablet TAKE 1 TABLET BY MOUTH EVERYDAY AT BEDTIME  . senna-docusate (SENOKOT-S) 8.6-50 MG tablet Take 2 tablets by mouth at bedtime. (Patient taking differently: Take 1 tablet by mouth every other day. )  . silver sulfADIAZINE (SILVADENE) 1 % cream Apply 1 application topically daily. (Patient not taking: Reported on 08/27/2018)  . [DISCONTINUED] cetirizine (ZYRTEC) 5 MG tablet Take 1 tablet (5 mg total) by mouth daily. Take at night time as it can cause some drowsiness   No facility-administered encounter medications on file as of 09/27/2018.     Functional Status:  In your present state of health, do you have any difficulty performing the following activities: 08/27/2018 06/04/2018  Hearing? N N  Vision? N N  Difficulty concentrating or making decisions? N Y  Walking or climbing stairs? Y Y  Dressing or bathing? Y Y  Doing errands, shopping? Y Y  Preparing Food and eating ? - -  Using the Toilet? - -  In the past six months, have you accidently leaked urine? - -  Do you have problems with loss of bowel control? - -  Managing your Medications? - -  Managing your Finances? - -  Housekeeping or managing your Housekeeping? - -  Some recent data might be hidden    Fall/Depression Screening:  PHQ 2/9 Scores 07/23/2018 06/20/2018 04/26/2018 04/23/2018 03/11/2018 12/03/2017 09/28/2017  PHQ - 2 Score 0 0 0 0 1 0 0  PHQ- 9 Score - - - - - - -  Exception Documentation - - - - - - -  Not completed - - - - - - -    Assessment: [facility] CSW arrived at [facility] and successfully completed [facility] Consult after receiving new referral and request to follow during patient's SNF stay on 09/26/18. Patient is aware that APS is involved in her case. Patient reports that she is unsure why APS is involved because she is safe and able to take appropriate care of herself while living at her daughters. Patient has Medicaid and could pursue LTC  placement but refuses to do so. Patient shares that she will be discharging back to her daughter's residence on 09/30/18 but per SNF social worker Eugenie Birks patient has a tentative discharge date of 10/03/18. Another [place] was left with patient. Patient is agreeable to [facility] and is agreeable to [facility] [place] following her post SNF discharge to help with her case management needs. Patient reports feeling that receiving PT/OT during her SNF stay has helped her to become stronger. She reports having an aide in the home 5 days per week for 4-5 hours per day. She reports that this aide is paid for through Medicaid. Patient reports that her aide comes in the mornings. Patient shares that her daughter is home throughout the entire day and assist her as needed too. Patient reports that her daughter provides her stable transportation to her medical appointments. [facility] CSW went to SNF social worker's office but was unable to locate [place]. [facility] CSW sent secure care coordination email to social worker. [facility] CSW completed call to [facility] APS and was provided contact information for patient's assigned APS caseworker. [facility] CSW completed call Crystal White at [employer] [phone] but was unable to reach her successfully. Voice message left encouraging a return call once available.   Plan: [facility] CSW will follow ping and await for SNF discharge and then will refer patient to [facility].  [name], BSW, MSW, [email or web address] Phone: [phone] Fax: [phone]

## 2018-09-27 NOTE — Patient Outreach (Signed)
[facility]) Care Management  09/27/2018  [name] 07-01-1924 [id number]  [facility] CSW received incoming return call from [name] who reports "the case has been unsubstantiated and therefore there is no need for protection and her case has been closed." APS case closed. [facility] CSW appreciative of information and care coordination.  [name], BSW, MSW, [name] Phone: [phone] Fax: [phone]

## 2018-10-02 ENCOUNTER — Other Ambulatory Visit: Payer: Self-pay | Admitting: Licensed Clinical Social Worker

## 2018-10-02 NOTE — Patient Outreach (Signed)
[facility]) Care Management  10/02/2018  [name] [birth year] [id number]  [facility] CSW arrived at [facility] and successfully completed [facility] [place] with patient. Patient reports that she is very excited to return home. Her updated discharge date is scheduled for tomorrow. Patient reports that her daughter will be with her at home throughout the day as well as her aides through [facility]. Patient agreeable to [place] involved post SNF discharge. [facility] CSW met with SNF social worker Marita [place] and it was confirmed that patient would be leaving facility tomorrow. She reported that they submitted a request for additional hours through [facility]. Patient will discharge back home with [place] and will receive : PT, OT, ST and an aide. No medical equipment needed to be ordered per SNF social worker. [facility] CSW will sign off at this time. [facility] CSW will refer case back to [facility] RNCM at this time.  [name], [name], [name], [email or web address] Phone: [phone] Fax: [phone]

## 2018-10-04 ENCOUNTER — Other Ambulatory Visit: Payer: Self-pay | Admitting: *Deleted

## 2018-10-04 NOTE — Patient Outreach (Signed)
[name]) Care Management  10/04/2018  [name] [birth year] [id number]   Referral received from social worker as member was recently discharged from SNF.  She was hospitalized 10/14-10/17 with complaints of chest pain, resided in SNF until 11/21.  Per chart, she has history of hypertension, heart failure, PAD, diabetes and hyperlipidemia.  [name] is her caregiver.  Call placed to [name], member's identity verified.  She is familiar with [name] as member was active with program earlier this year.  [name] report member is doing well, has not complained of any pain or discomfort.  She denies obtaining all of her medications from pharmacy, but will get them today.  Member still has aide that comes in daily for additional support.    Member has follow up appointment scheduled for 12/3, [name] concerned about transportation.  Member has [employer], but daughter was unaware that someone could accompany her on the bus.  She is advised to inquire about member's eligibility.  Informed if it was noted that member would need someone to help with transport, daughter would be able to ride free.  If it was noted that member could travel alone, daughter would still be able to ride but would have to pay the bus fee.  She verbalizes understanding and will look into it.  Daughter denies any urgent concerns at this time.  Advised to contact this care manager with questions, confirmed she has this care manager's contact information.  THN CM Care Plan Problem One     Most Recent Value  Care Plan Problem One  Risk for readmission as evidenced by recent [name] admission requiring SNF at discharge  Role Documenting the Problem One  Care Management [name] for Problem One  Active  [name] Term Goal   Member will not have [name] admission or ED visits within the next 31 days  [facility]N Long Term Goal Start Date  10/04/18  Interventions for Problem One Long Term Goal  Daughter educated  on importance of following discharge instructions (follow up appointments, medications, and home health involvement) in effort to decrease risk of readmission  [facility] CM Short Term Goal #1   Member will take medications as indicated within the next 3 weeks  [facility]N CM Short Term Goal #1 Start Date  10/04/18  Interventions for Short Term Goal #1  Attempted to review medications, daughter educted to pick up new prescriptions from pharmacy.  Scheduled for home visit will review in the home at that time  [name] CM Short Term Goal #2   Member will attend follow up appointment with primary MD within the next 2 weeks  [facility]N CM Short Term Goal #2 Start Date  10/04/18  Interventions for Short Term Goal #2  Upcoming appointment reviewed, discussed transportation.  Confirmed member is active with [employer].      [name], [place], MSN [place] [phone]

## 2018-10-06 DIAGNOSIS — K5900 Constipation, unspecified: Secondary | ICD-10-CM | POA: Diagnosis not present

## 2018-10-06 DIAGNOSIS — I5030 Unspecified diastolic (congestive) heart failure: Secondary | ICD-10-CM | POA: Diagnosis not present

## 2018-10-06 DIAGNOSIS — E119 Type 2 diabetes mellitus without complications: Secondary | ICD-10-CM | POA: Diagnosis not present

## 2018-10-06 DIAGNOSIS — Z7982 Long term (current) use of aspirin: Secondary | ICD-10-CM | POA: Diagnosis not present

## 2018-10-06 DIAGNOSIS — L89621 Pressure ulcer of left heel, stage 1: Secondary | ICD-10-CM | POA: Diagnosis not present

## 2018-10-06 DIAGNOSIS — Z8673 Personal history of transient ischemic attack (TIA), and cerebral infarction without residual deficits: Secondary | ICD-10-CM | POA: Diagnosis not present

## 2018-10-06 DIAGNOSIS — I2510 Atherosclerotic heart disease of native coronary artery without angina pectoris: Secondary | ICD-10-CM | POA: Diagnosis not present

## 2018-10-06 DIAGNOSIS — D509 Iron deficiency anemia, unspecified: Secondary | ICD-10-CM | POA: Diagnosis not present

## 2018-10-06 DIAGNOSIS — Z853 Personal history of malignant neoplasm of breast: Secondary | ICD-10-CM | POA: Diagnosis not present

## 2018-10-06 DIAGNOSIS — E441 Mild protein-calorie malnutrition: Secondary | ICD-10-CM | POA: Diagnosis not present

## 2018-10-06 DIAGNOSIS — I110 Hypertensive heart disease with heart failure: Secondary | ICD-10-CM | POA: Diagnosis not present

## 2018-10-06 DIAGNOSIS — R0789 Other chest pain: Secondary | ICD-10-CM | POA: Diagnosis not present

## 2018-10-06 DIAGNOSIS — F0390 Unspecified dementia without behavioral disturbance: Secondary | ICD-10-CM | POA: Diagnosis not present

## 2018-10-07 ENCOUNTER — Telehealth: Payer: Self-pay | Admitting: Family Medicine

## 2018-10-07 DIAGNOSIS — D509 Iron deficiency anemia, unspecified: Secondary | ICD-10-CM | POA: Diagnosis not present

## 2018-10-07 DIAGNOSIS — I110 Hypertensive heart disease with heart failure: Secondary | ICD-10-CM | POA: Diagnosis not present

## 2018-10-07 DIAGNOSIS — R0789 Other chest pain: Secondary | ICD-10-CM | POA: Diagnosis not present

## 2018-10-07 DIAGNOSIS — I5030 Unspecified diastolic (congestive) heart failure: Secondary | ICD-10-CM | POA: Diagnosis not present

## 2018-10-07 DIAGNOSIS — I2510 Atherosclerotic heart disease of native coronary artery without angina pectoris: Secondary | ICD-10-CM | POA: Diagnosis not present

## 2018-10-07 DIAGNOSIS — E119 Type 2 diabetes mellitus without complications: Secondary | ICD-10-CM | POA: Diagnosis not present

## 2018-10-07 NOTE — Telephone Encounter (Signed)
[facility] requesting that patient have PT twice a week for 2 weeks, and then once a week for 2 weeks.  Please call Merry Proud at [phone] if you have any questions.

## 2018-10-08 ENCOUNTER — Telehealth: Payer: Self-pay | Admitting: *Deleted

## 2018-10-08 DIAGNOSIS — R0789 Other chest pain: Secondary | ICD-10-CM | POA: Diagnosis not present

## 2018-10-08 DIAGNOSIS — E119 Type 2 diabetes mellitus without complications: Secondary | ICD-10-CM | POA: Diagnosis not present

## 2018-10-08 DIAGNOSIS — I110 Hypertensive heart disease with heart failure: Secondary | ICD-10-CM | POA: Diagnosis not present

## 2018-10-08 DIAGNOSIS — I5030 Unspecified diastolic (congestive) heart failure: Secondary | ICD-10-CM | POA: Diagnosis not present

## 2018-10-08 DIAGNOSIS — D509 Iron deficiency anemia, unspecified: Secondary | ICD-10-CM | POA: Diagnosis not present

## 2018-10-08 DIAGNOSIS — I2510 Atherosclerotic heart disease of native coronary artery without angina pectoris: Secondary | ICD-10-CM | POA: Diagnosis not present

## 2018-10-08 NOTE — Telephone Encounter (Signed)
Becca from [facility] calling for Skilled nursing verbal orders as follows:  2 time(s) weekly for 1 week(s), then 1 time(s) weekly for 3 week(s), then Every other week for 3 weeks and also 2 PRN visits.  You can leave verbal orders on confidential voicemail.  Anfernee Peschke, [name], CMA

## 2018-10-08 NOTE — Telephone Encounter (Signed)
Verbal orders placed.   [name], [place], PGY-3

## 2018-10-08 NOTE — Telephone Encounter (Signed)
Called and left voicemail for verbal orders.  [name], [place], PGY-3

## 2018-10-09 ENCOUNTER — Other Ambulatory Visit: Payer: Self-pay | Admitting: *Deleted

## 2018-10-09 NOTE — Patient Outreach (Signed)
[facility]) Care Management  10/09/2018  [name] 1923/11/29 [id number]   Weekly transition of care call placed to member's daughter.  She report member is doing well, denies that member has had any complaints of pain or constipation.  Confirms she has picked up new medications, report compliance.  Denies any urgent concerns at this time, home visit scheduled for next week.  [name], [place], MSN [place] [phone]

## 2018-10-14 ENCOUNTER — Other Ambulatory Visit: Payer: Self-pay | Admitting: Family Medicine

## 2018-10-14 ENCOUNTER — Ambulatory Visit: Payer: Self-pay

## 2018-10-14 ENCOUNTER — Ambulatory Visit: Payer: Self-pay | Admitting: *Deleted

## 2018-10-14 NOTE — Patient Outreach (Signed)
[place] [facility]) Care Management  10/14/2018  [name] Aug 25, 1924 [id number]  Visit scheduled with member for today at 81. Called member and member's daughter multiple times to confirm visit; however, each attempt unsuccessful. HIPPA compliant voice mail message left. Will await return call. If no call back, will follow up in next 4 business days.   Update:   Received call from member's daughter who stated that member is doing well and visit rescheduled for next Tuesday 10-22-2018. Member's daughter verbalized agreement and understanding. Discussed upcoming appointments. Denies any concerns.  Benjamine Mola "ANN" Josiah Lobo, RN-BSN  [facility] Care Management  Community Care Management Coordinator  [phone] [email or web address]

## 2018-10-15 ENCOUNTER — Ambulatory Visit (INDEPENDENT_AMBULATORY_CARE_PROVIDER_SITE_OTHER): Payer: Medicare Other | Admitting: Family Medicine

## 2018-10-15 ENCOUNTER — Encounter: Payer: Self-pay | Admitting: Family Medicine

## 2018-10-15 VITALS — BP 115/70 | HR 69 | Temp 98.2°F | Wt 110.8 lb

## 2018-10-15 DIAGNOSIS — I10 Essential (primary) hypertension: Secondary | ICD-10-CM | POA: Diagnosis not present

## 2018-10-15 DIAGNOSIS — D509 Iron deficiency anemia, unspecified: Secondary | ICD-10-CM | POA: Diagnosis not present

## 2018-10-15 DIAGNOSIS — Z7409 Other reduced mobility: Secondary | ICD-10-CM | POA: Diagnosis not present

## 2018-10-15 NOTE — Assessment & Plan Note (Signed)
Chronic.  Controlled.  Has good adherence.  No history of syncope or fall. -  Continue amlodipine 5 mg daily, carvedilol 12.5 mg twice daily, and losartan 50 mg daily

## 2018-10-15 NOTE — Patient Instructions (Signed)
Thank you for coming in to see [name] today. Please see below to review our plan for today's visit.  1.  We will check your blood levels today.  I will call you if there is any abnormalities within 48 hours, otherwise you will receive results in the mail.  I will also instruct you on if you need to restart the iron tablets. 2.  I placed an order for a wheelchair replacement.  He should receive a phone call with instructions on how to receive this over the next few weeks.  Please call the clinic at [phone] if your symptoms worsen or you have any concerns. It was our pleasure to serve you.  [name], [place], PGY-3

## 2018-10-15 NOTE — Assessment & Plan Note (Signed)
Chronic.  Wheelchair dependent.  High risk for falls. - DME manual wheelchair ordered

## 2018-10-15 NOTE — Progress Notes (Signed)
Subjective   Patient ID: [name]    DOB: 09-19-1924, 84 y.o. female   MRN: [id number]  CC: "Follow-up"  HPI: [name] is a 84 y.o. female who presents to clinic today for the following:  Mobility impaired: Patient is here today accompanied by her granddaughter for routine follow-up.  Patient is needing a replacement for her manual wheelchair.  She has had it for over a year but has been having difficulty using it and says that there are some parts that need to be fixed.  She is unable to use a walker, cane, or crutches to ambulate safely.  She has no recent history of falls.  She does have generalized weakness and is a high fall risk.  Microcytic anemia: Patient with a history of elevated ferritin consistent with anemia of chronic inflammation recently hospitalized found to have microcytic anemia with normal ferritin level 4 months ago following hospital discharge.  She was instructed to take ferritin but has not been doing so.  She denies vaginal bleeding, easy bruising, fatigue, shortness of breath.  Hypertension: Patient last seen for hypotension follow-up thought to be related to medication use but instead was hypertensive back in September.  Patient restarted her carvedilol, amlodipine and losartan.  She has been tolerating her medications well.  She is a non-smoker.  She denies chest pain, shortness of breath, palpitations, lower extremity swelling, or change in vision.  ROS: see HPI for pertinent.  [place]: HTN, PAD, h/o vertebrobasilar artery syndrome, HFpEF, hiatal hernia, GERD, tobacco use disorder, h/o b/l-breast cancer, h/o colon cancer, severe protein-calorie malnutrition, gout, IDA.Surgical history tot knee, EGD for esophogeal tear, b/l mastectomy, cardiac cath 2007 severe 3-vessel disease. Family history heart disease. Smoking status reviewed. Medications reviewed.  Objective   BP 115/70   Pulse 69   Temp 98.2 F (36.8 C)   Wt 110 lb 12.8 oz (50.3 kg)   SpO2 98%    BMI 23.16 kg/m  Vitals and nursing note reviewed.  General: frail elderly female in wheelchair, well nourished, well developed, NAD with non-toxic appearance HEENT: normocephalic, atraumatic, moist mucous membranes Neck: supple, non-tender without lymphadenopathy Cardiovascular: regular rate and rhythm without murmurs, rubs, or gallops Lungs: clear to auscultation bilaterally with normal work of breathing Abdomen: soft, non-tender, non-distended, normoactive bowel sounds Skin: warm, dry, no rashes or lesions, cap refill < 2 seconds Extremities: warm and well perfused, normal tone, no edema, 3/5 motor strength in all 4 extremities  Assessment & Plan   Mobility impaired Chronic.  Wheelchair dependent.  High risk for falls. - DME manual wheelchair ordered  Primary hypertension Chronic.  Controlled.  Has good adherence.  No history of syncope or fall. -  Continue amlodipine 5 mg daily, carvedilol 12.5 mg twice daily, and losartan 50 mg daily  Microcytic anemia Chronic.  Has history of elevated ferritin, however more recent 4 months ago was normalized.  Suspicious for iron deficiency anemia.  Does have history of breast and colon cancer.  Not currently adherent to iron tablets due to daughter not being aware of need. - Checking CBC with differential and ferritin - Will wait on ferrous sulfate at this time based on lab results - Discussed advanced directives with daughter [name] over the phone and will plan on full RTC in 1 month  Orders Placed This Encounter  Procedures  . DME Wheelchair manual    Patient suffers from mobility impairment which impairs their ability to perform daily activities like bathing, dressing, feeding, grooming and  toileting in the home.  A cane, crutch or walker will not resolve  issue with performing activities of daily living. A wheelchair will allow patient to safely perform daily activities. Patient can safely propel the wheelchair in the home or has a caregiver  who can provide assistance.  Accessories: elevating leg rests (ELRs), wheel locks, extensions and anti-tippers.  Marland Kitchen CBC with Differential/Platelet  . Ferritin   No orders of the defined types were placed in this encounter.   [name], [place], PGY-3 10/15/2018, 4:16 PM

## 2018-10-15 NOTE — Assessment & Plan Note (Signed)
Chronic.  Has history of elevated ferritin, however more recent 4 months ago was normalized.  Suspicious for iron deficiency anemia.  Does have history of breast and colon cancer.  Not currently adherent to iron tablets due to daughter not being aware of need. - Checking CBC with differential and ferritin - Will wait on ferrous sulfate at this time based on lab results - Discussed advanced directives with [name] over the phone and will plan on full RTC in 1 month

## 2018-10-16 ENCOUNTER — Ambulatory Visit: Payer: Medicare Other | Admitting: Podiatry

## 2018-10-16 ENCOUNTER — Encounter: Payer: Self-pay | Admitting: Family Medicine

## 2018-10-16 LAB — CBC WITH DIFFERENTIAL/PLATELET
BASOS ABS: 0 10*3/uL (ref 0.0–0.2)
Basos: 1 %
EOS (ABSOLUTE): 0.1 10*3/uL (ref 0.0–0.4)
Eos: 3 %
Hematocrit: 36.7 % (ref 34.0–46.6)
Hemoglobin: 11.2 g/dL (ref 11.1–15.9)
IMMATURE GRANULOCYTES: 0 %
Immature Grans (Abs): 0 10*3/uL (ref 0.0–0.1)
LYMPHS ABS: 1.7 10*3/uL (ref 0.7–3.1)
Lymphs: 42 %
MCH: 23 pg — ABNORMAL LOW (ref 26.6–33.0)
MCHC: 30.5 g/dL — AB (ref 31.5–35.7)
MCV: 76 fL — ABNORMAL LOW (ref 79–97)
MONOS ABS: 0.3 10*3/uL (ref 0.1–0.9)
Monocytes: 6 %
NEUTROS PCT: 48 %
Neutrophils Absolute: 2 10*3/uL (ref 1.4–7.0)
PLATELETS: 204 10*3/uL (ref 150–450)
RBC: 4.86 x10E6/uL (ref 3.77–5.28)
RDW: 16.1 % — AB (ref 12.3–15.4)
WBC: 4.2 10*3/uL (ref 3.4–10.8)

## 2018-10-16 LAB — FERRITIN: Ferritin: 238 ng/mL — ABNORMAL HIGH (ref 15–150)

## 2018-10-17 ENCOUNTER — Ambulatory Visit (INDEPENDENT_AMBULATORY_CARE_PROVIDER_SITE_OTHER): Payer: Medicare Other | Admitting: Podiatry

## 2018-10-17 ENCOUNTER — Encounter: Payer: Self-pay | Admitting: Podiatry

## 2018-10-17 DIAGNOSIS — D509 Iron deficiency anemia, unspecified: Secondary | ICD-10-CM | POA: Diagnosis not present

## 2018-10-17 DIAGNOSIS — Z86711 Personal history of pulmonary embolism: Secondary | ICD-10-CM | POA: Insufficient documentation

## 2018-10-17 DIAGNOSIS — E43 Unspecified severe protein-calorie malnutrition: Secondary | ICD-10-CM | POA: Insufficient documentation

## 2018-10-17 DIAGNOSIS — B351 Tinea unguium: Secondary | ICD-10-CM | POA: Diagnosis not present

## 2018-10-17 DIAGNOSIS — M79675 Pain in left toe(s): Secondary | ICD-10-CM

## 2018-10-17 DIAGNOSIS — I2510 Atherosclerotic heart disease of native coronary artery without angina pectoris: Secondary | ICD-10-CM | POA: Diagnosis not present

## 2018-10-17 DIAGNOSIS — I5030 Unspecified diastolic (congestive) heart failure: Secondary | ICD-10-CM | POA: Diagnosis not present

## 2018-10-17 DIAGNOSIS — I110 Hypertensive heart disease with heart failure: Secondary | ICD-10-CM | POA: Diagnosis not present

## 2018-10-17 DIAGNOSIS — E119 Type 2 diabetes mellitus without complications: Secondary | ICD-10-CM | POA: Diagnosis not present

## 2018-10-17 DIAGNOSIS — E118 Type 2 diabetes mellitus with unspecified complications: Secondary | ICD-10-CM | POA: Insufficient documentation

## 2018-10-17 DIAGNOSIS — E1142 Type 2 diabetes mellitus with diabetic polyneuropathy: Secondary | ICD-10-CM

## 2018-10-17 DIAGNOSIS — R0789 Other chest pain: Secondary | ICD-10-CM | POA: Diagnosis not present

## 2018-10-17 DIAGNOSIS — M79674 Pain in right toe(s): Secondary | ICD-10-CM

## 2018-10-17 DIAGNOSIS — Z86718 Personal history of other venous thrombosis and embolism: Secondary | ICD-10-CM | POA: Insufficient documentation

## 2018-10-17 IMAGING — CR DG CHEST 2V
2 series · 2 of 2 positions shown · non-contrast
Comparison: 04/04/2018 and 02/18/2018

CLINICAL DATA: Right flank and chest pain radiating to the axilla
since last night with associated shortness of breath and
diaphoresis.

EXAM:
CHEST - 2 VIEW

[chest lat]
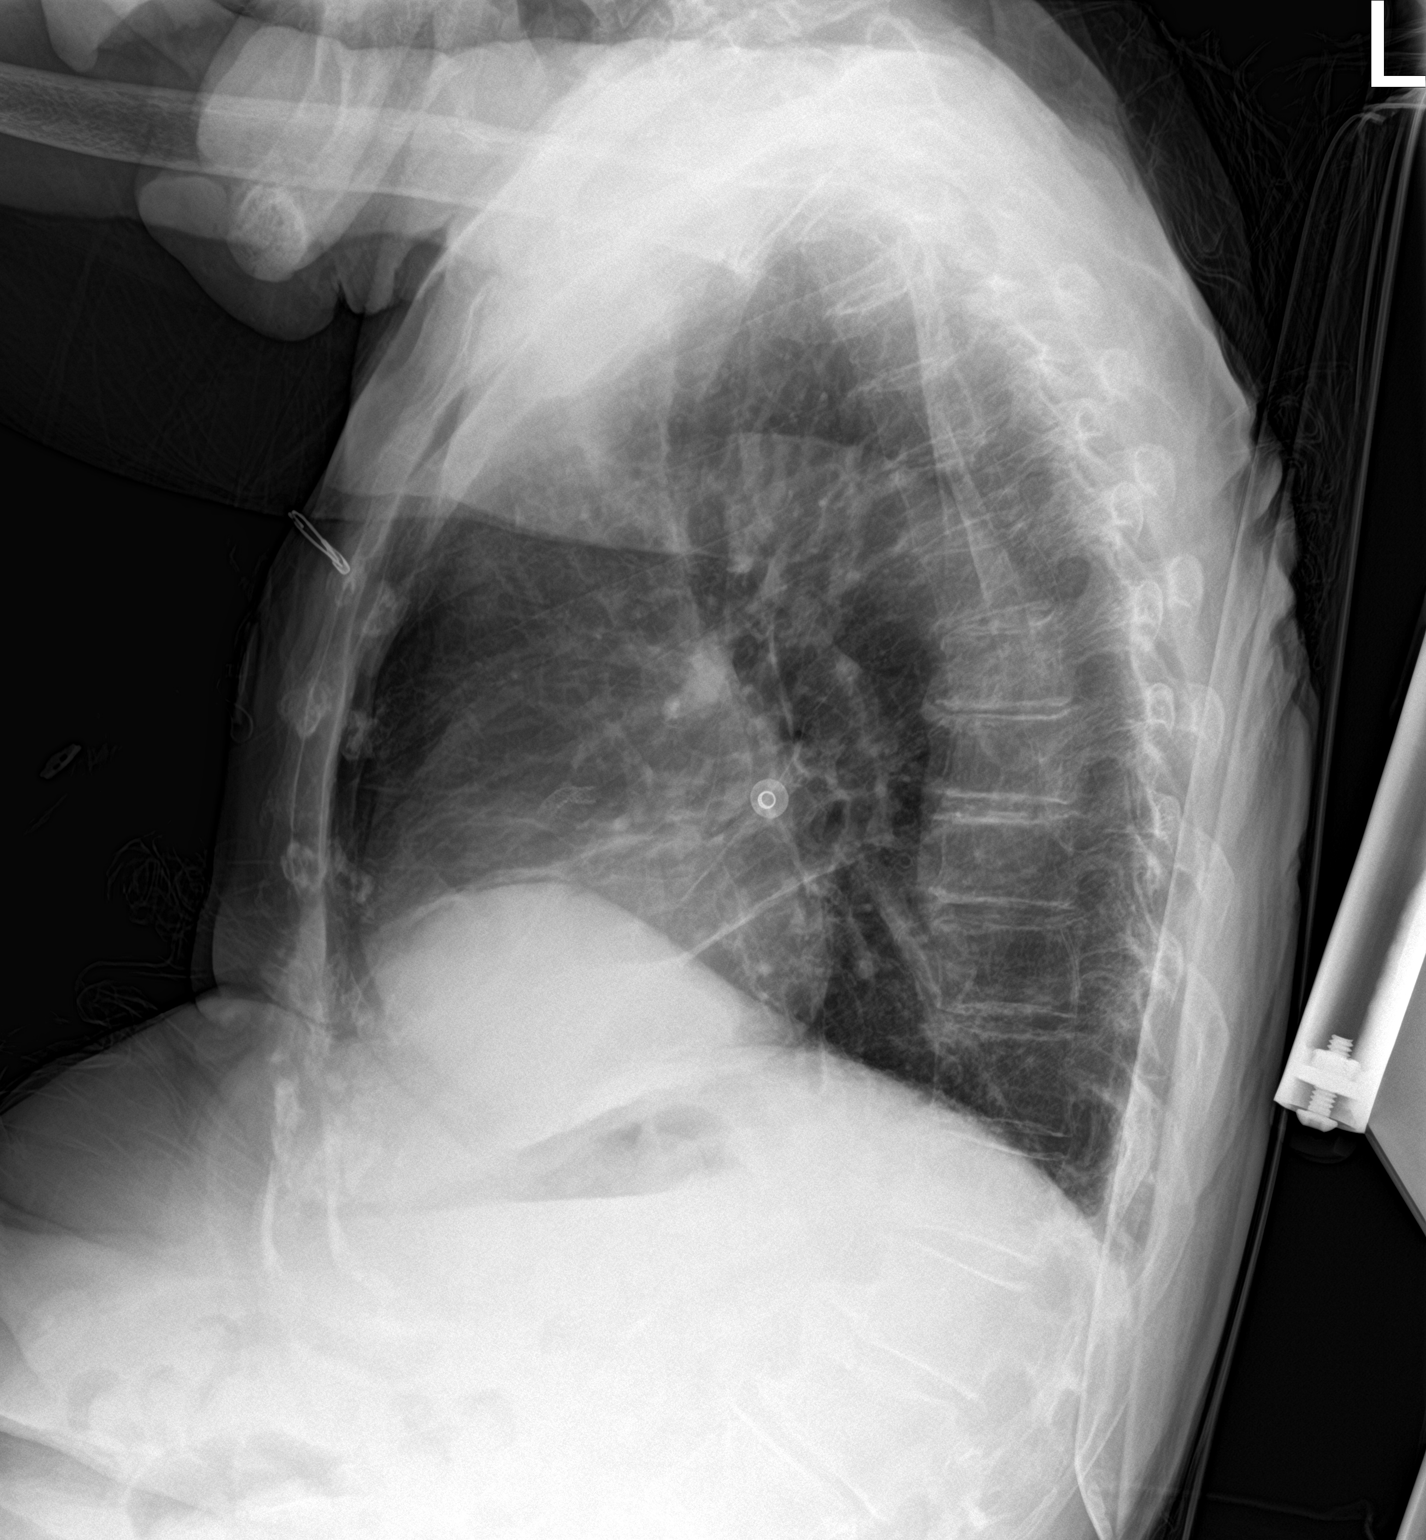

[chest ap]
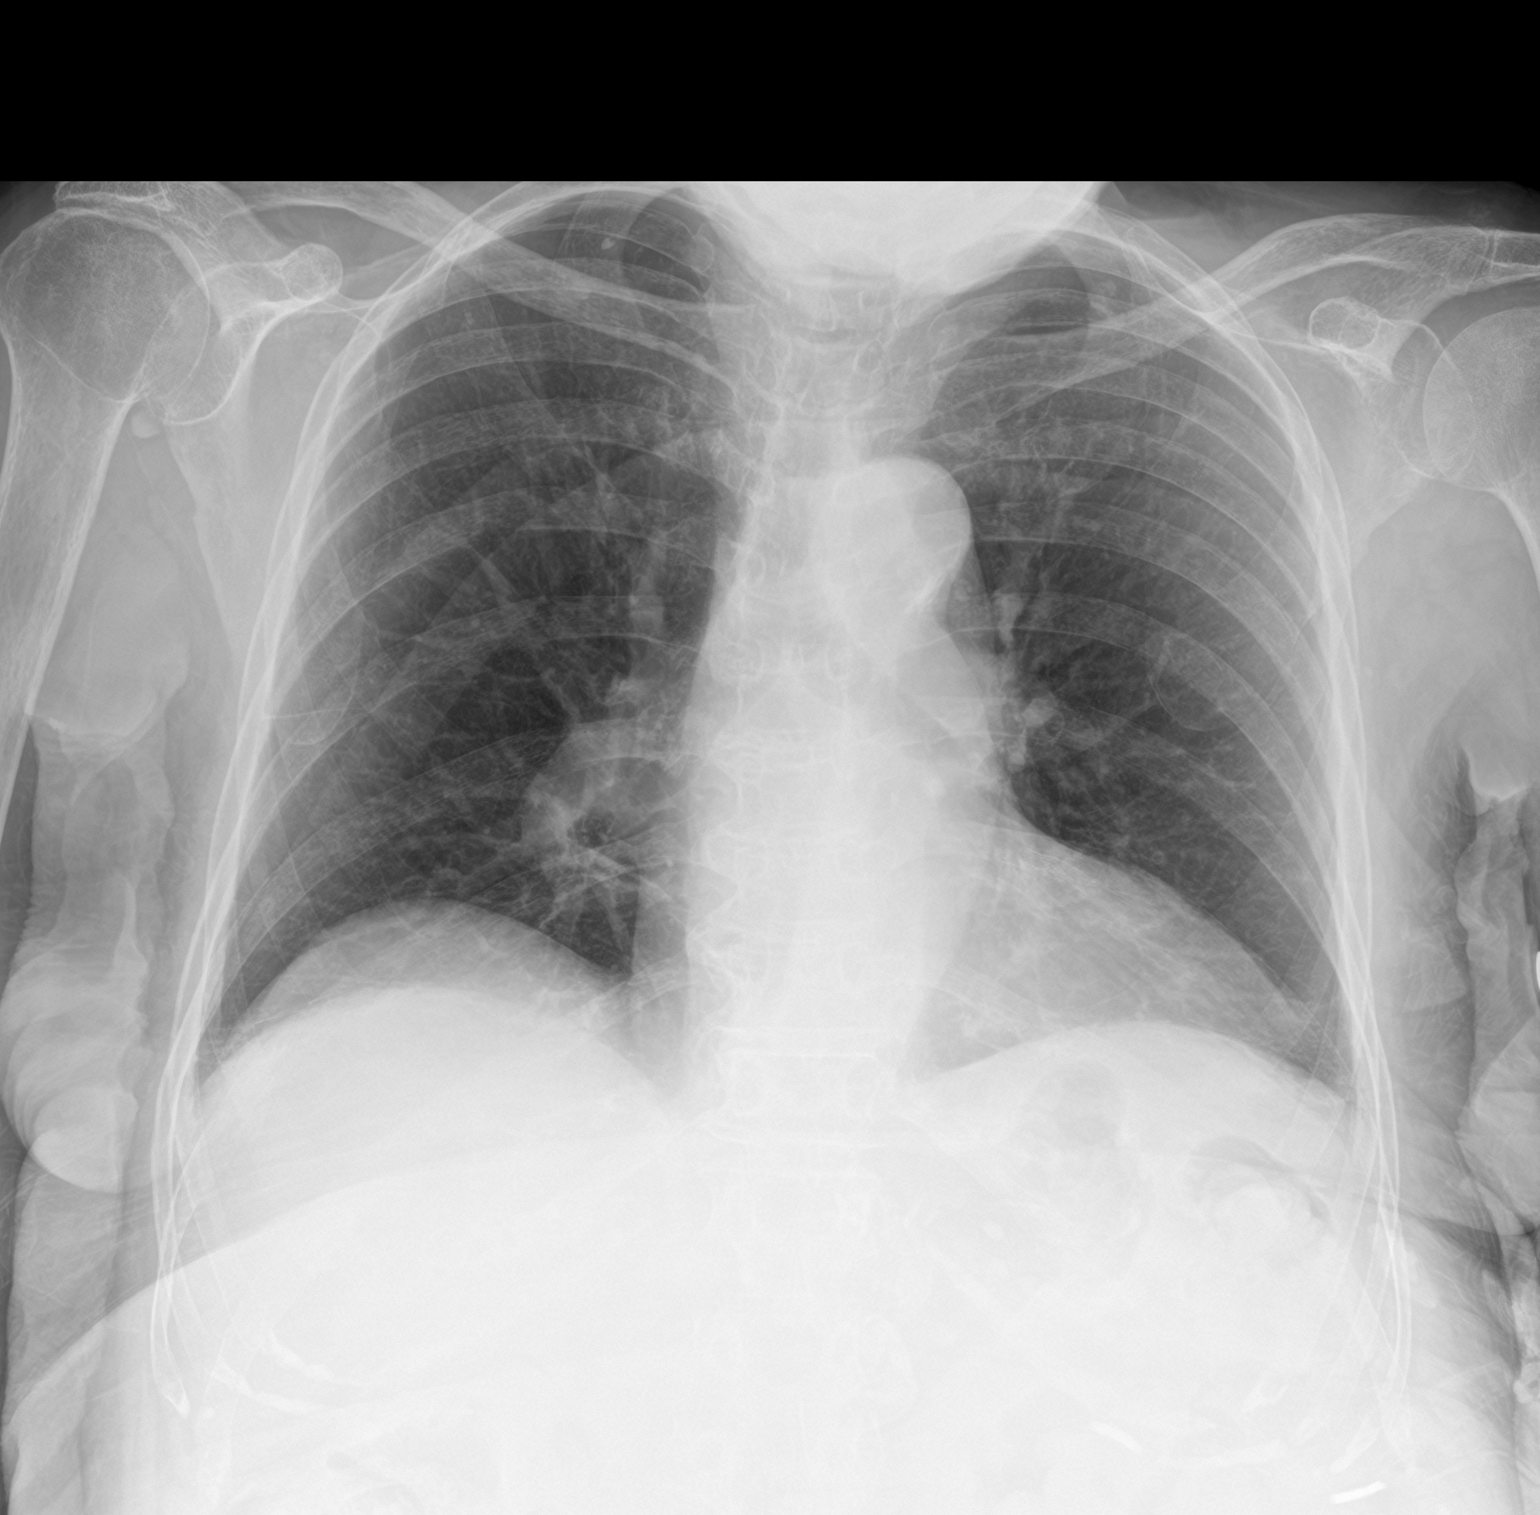

[2 of 2 positions shown; findings below may reference images not displayed]

FINDINGS: The heart size and mediastinal contours are stable. There is aortic
atherosclerosis. The lungs are clear. There is no pleural effusion
or pneumothorax. There are stable degenerative changes throughout
the thoracolumbar spine. Small calcific density along the inferior
right glenoid rim is unchanged.
IMPRESSION: Stable chest.  No active cardiopulmonary process.

## 2018-10-17 NOTE — Patient Instructions (Addendum)
Diabetes and Foot Care Diabetes may cause you to have problems because of poor blood supply (circulation) to your feet and legs. This may cause the skin on your feet to become thinner, break easier, and heal more slowly. Your skin may become dry, and the skin may peel and crack. You may also have nerve damage in your legs and feet causing decreased feeling in them. You may not notice minor injuries to your feet that could lead to infections or more serious problems. Taking care of your feet is one of the most important things you can do for yourself. Follow these instructions at home:  Wear shoes at all times, even in the house. Do not go barefoot. Bare feet are easily injured.  Check your feet daily for blisters, cuts, and redness. If you cannot see the bottom of your feet, use a mirror or ask someone for help.  Wash your feet with warm water (do not use hot water) and mild soap. Then pat your feet and the areas between your toes until they are completely dry. Do not soak your feet as this can dry your skin.  Apply a moisturizing lotion or petroleum jelly (that does not contain alcohol and is unscented) to the skin on your feet and to dry, brittle toenails. Do not apply lotion between your toes.  Trim your toenails straight across. Do not dig under them or around the cuticle. File the edges of your nails with an emery board or nail file.  Do not cut corns or calluses or try to remove them with medicine.  Wear clean socks or stockings every day. Make sure they are not too tight. Do not wear knee-high stockings since they may decrease blood flow to your legs.  Wear shoes that fit properly and have enough cushioning. To break in new shoes, wear them for just a few hours a day. This prevents you from injuring your feet. Always look in your shoes before you put them on to be sure there are no objects inside.  Do not cross your legs. This may decrease the blood flow to your feet.  If you find a  minor scrape, cut, or break in the skin on your feet, keep it and the skin around it clean and dry. These areas may be cleansed with mild soap and water. Do not cleanse the area with peroxide, alcohol, or iodine.  When you remove an adhesive bandage, be sure not to damage the skin around it.  If you have a wound, look at it several times a day to make sure it is healing.  Do not use heating pads or hot water bottles. They may burn your skin. If you have lost feeling in your feet or legs, you may not know it is happening until it is too late.  Make sure your health care provider performs a complete foot exam at least annually or more often if you have foot problems. Report any cuts, sores, or bruises to your health care provider immediately. Contact a health care provider if:  You have an injury that is not healing.  You have cuts or breaks in the skin.  You have an ingrown nail.  You notice redness on your legs or feet.  You feel burning or tingling in your legs or feet.  You have pain or cramps in your legs and feet.  Your legs or feet are numb.  Your feet always feel cold. Get help right away if:  There is increasing   redness, swelling, or pain in or around a wound.  There is a red line that goes up your leg.  Pus is coming from a wound.  You develop a fever or as directed by your health care provider.  You notice a bad smell coming from an ulcer or wound. This information is not intended to replace advice given to you by your health care provider. Make sure you discuss any questions you have with your health care provider. Document Released: 10/27/2000 Document Revised: 04/06/2016 Document Reviewed: 04/08/2013 Elsevier Interactive Patient Education  2017 Elsevier Inc.  Diabetic Neuropathy Diabetic neuropathy is a nerve disease or nerve damage that is caused by diabetes mellitus. About half of all people with diabetes mellitus have some form of nerve damage. Nerve damage  is more common in those who have had diabetes mellitus for many years and who generally have not had good control of their blood sugar (glucose) level. Diabetic neuropathy is a common complication of diabetes mellitus. There are three common types of diabetic neuropathy and a fourth type that is less common and less understood:  Peripheral neuropathy-This is the most common type of diabetic neuropathy. It causes damage to the nerves of the feet and legs first and then eventually the hands and arms. The damage affects the ability to sense touch.  Autonomic neuropathy-This type causes damage to the autonomic nervous system, which controls the following functions: ? Heartbeat. ? Body temperature. ? Blood pressure. ? Urination. ? Digestion. ? Sweating. ? Sexual function.  Focal neuropathy-Focal neuropathy can be painful and unpredictable and occurs most often in older adults with diabetes mellitus. It involves a specific nerve or one area and often comes on suddenly. It usually does not cause long-term problems.  Radiculoplexus neuropathy- Sometimes called lumbosacral radiculoplexus neuropathy, radiculoplexus neuropathy affects the nerves of the thighs, hips, buttocks, or legs. It is more common in people with type 2 diabetes mellitus and in older men. It is characterized by debilitating pain, weakness, and atrophy, usually in the thigh muscles.  What are the causes? The cause of peripheral, autonomic, and focal neuropathies is diabetes mellitus that is uncontrolled and high glucose levels. The cause of radiculoplexus neuropathy is unknown. However, it is thought to be caused by inflammation related to uncontrolled glucose levels. What are the signs or symptoms? Peripheral Neuropathy Peripheral neuropathy develops slowly over time. When the nerves of the feet and legs no longer work there may be:  Burning, stabbing, or aching pain in the legs or feet.  Inability to feel pressure or pain in your  feet. This can lead to: ? Thick calluses over pressure areas. ? Pressure sores. ? Ulcers.  Foot deformities.  Reduced ability to feel temperature changes.  Muscle weakness.  Autonomic Neuropathy The symptoms of autonomic neuropathy vary depending on which nerves are affected. Symptoms may include:  Problems with digestion, such as: ? Feeling sick to your stomach (nausea). ? Vomiting. ? Bloating. ? Constipation. ? Diarrhea. ? Abdominal pain.  Difficulty with urination. This occurs if you lose your ability to sense when your bladder is full. Problems include: ? Urine leakage (incontinence). ? Inability to empty your bladder completely (retention).  Rapid or irregular heartbeat (palpitations).  Blood pressure drops when you stand up (orthostatic hypotension). When you stand up you may feel: ? Dizzy. ? Weak. ? Faint.  In men, inability to attain and maintain an erection.  In women, vaginal dryness and problems with decreased sexual desire and arousal.  Problems with body temperature   regulation.  Increased or decreased sweating.  Focal Neuropathy  Abnormal eye movements or abnormal alignment of both eyes.  Weakness in the wrist.  Foot drop. This results in an inability to lift the foot properly and abnormal walking or foot movement.  Paralysis on one side of your face (Bell palsy).  Chest or abdominal pain. Radiculoplexus Neuropathy  Sudden, severe pain in your hip, thigh, or buttocks.  Weakness and wasting of thigh muscles.  Difficulty rising from a seated position.  Abdominal swelling.  Unexplained weight loss (usually more than 10 lb [4.5 kg]). How is this diagnosed? Peripheral Neuropathy Your senses may be tested. Sensory function testing can be done with:  A light touch using a monofilament.  A vibration with tuning fork.  A sharp sensation with a pin prick.  Other tests that can help diagnose neuropathy are:  Nerve conduction velocity. This  test checks the transmission of an electrical current through a nerve.  Electromyography. This shows how muscles respond to electrical signals transmitted by nearby nerves.  Quantitative sensory testing. This is used to assess how your nerves respond to vibrations and changes in temperature.  Autonomic Neuropathy Diagnosis is often based on reported symptoms. Tell your health care provider if you experience:  Dizziness.  Constipation.  Diarrhea.  Inappropriate urination or inability to urinate.  Inability to get or maintain an erection.  Tests that may be done include:  Electrocardiography or Holter monitor. These are tests that can help show problems with the heart rate or heart rhythm.  An X-ray exam may be done.  Focal Neuropathy Diagnosis is made based on your symptoms and what your health care provider finds during your exam. Other tests may be done. They may include:  Nerve conduction velocities. This checks the transmission of electrical current through a nerve.  Electromyography. This shows how muscles respond to electrical signals transmitted by nearby nerves.  Quantitative sensory testing. This test is used to assess how your nerves respond to vibration and changes in temperature.  Radiculoplexus Neuropathy  Often the first thing is to eliminate any other issue or problems that might be the cause, as there is no standard test for diagnosis.  X-ray exam of your spine and lumbar region.  Spinal tap to rule out cancer.  MRI to rule out other lesions. How is this treated? Once nerve damage occurs, it cannot be reversed. The goal of treatment is to keep the disease or nerve damage from getting worse and affecting more nerve fibers. Controlling your blood glucose level is the key. Most people with radiculoplexus neuropathy see at least a partial improvement over time. You will need to keep your blood glucose and HbA1c levels in the target range determined by your  health care provider. Things that help control blood glucose levels include:  Blood glucose monitoring.  Meal planning.  Physical activity.  Diabetes medicine.  Over time, maintaining lower blood glucose levels helps lessen symptoms. Sometimes, prescription pain medicine is needed. Follow these instructions at home:  Do not smoke.  Keep your blood glucose level in the range that you and your health care provider have determined acceptable for you.  Keep your blood pressure level in the range that you and your health care provider have determined acceptable for you.  Eat a well-balanced diet.  Be physically active every day. Include strength training and balance exercises.  Protect your feet. ? Check your feet every day for sores, cuts, blisters, or signs of infection. ? Wear padded socks   and supportive shoes. Use orthotic inserts, if necessary. ? Regularly check the insides of your shoes for worn spots. Make sure there are no rocks or other items inside your shoes before you put them on. Contact a health care provider if:  You have burning, stabbing, or aching pain in the legs or feet.  You are unable to feel pressure or pain in your feet.  You develop problems with digestion such as: ? Nausea. ? Vomiting. ? Bloating. ? Constipation. ? Diarrhea. ? Abdominal pain.  You have difficulty with urination, such as: ? Incontinence. ? Retention.  You have palpitations.  You develop orthostatic hypotension. When you stand up you may feel: ? Dizzy. ? Weak. ? Faint.  You cannot attain and maintain an erection (in men).  You have vaginal dryness and problems with decreased sexual desire and arousal (in women).  You have severe pain in your thighs, legs, or buttocks.  You have unexplained weight loss. This information is not intended to replace advice given to you by your health care provider. Make sure you discuss any questions you have with your health care  provider. Document Released: 01/08/2002 Document Revised: 04/06/2016 Document Reviewed: 04/10/2013 Elsevier Interactive Patient Education  2017 Elsevier Inc.  

## 2018-10-18 DIAGNOSIS — R0789 Other chest pain: Secondary | ICD-10-CM | POA: Diagnosis not present

## 2018-10-18 DIAGNOSIS — I5030 Unspecified diastolic (congestive) heart failure: Secondary | ICD-10-CM | POA: Diagnosis not present

## 2018-10-18 DIAGNOSIS — E119 Type 2 diabetes mellitus without complications: Secondary | ICD-10-CM | POA: Diagnosis not present

## 2018-10-18 DIAGNOSIS — I2510 Atherosclerotic heart disease of native coronary artery without angina pectoris: Secondary | ICD-10-CM | POA: Diagnosis not present

## 2018-10-18 DIAGNOSIS — D509 Iron deficiency anemia, unspecified: Secondary | ICD-10-CM | POA: Diagnosis not present

## 2018-10-18 DIAGNOSIS — I110 Hypertensive heart disease with heart failure: Secondary | ICD-10-CM | POA: Diagnosis not present

## 2018-10-22 ENCOUNTER — Other Ambulatory Visit: Payer: Self-pay

## 2018-10-22 NOTE — Patient Outreach (Signed)
[facility]) Care Management   10/22/2018  [name] 23-Oct-1924 [id number]  [name] is an 84 y.o. female  Subjective: Member alert and oriented x4. Denies pain. Denies mobile meals. Verified PCP visits: Last PCP visit 10-15-2018; Podiatry 01-16-2019; Neurology 02-19-2019  Objective:   Review of Systems  Constitutional: Negative.   HENT: Negative.   Eyes: Positive for blurred vision.       States need for new glasses  Respiratory: Negative.   Cardiovascular: Negative.   Gastrointestinal: Positive for constipation.  Genitourinary: Positive for urgency.  Skin: Negative.   Neurological: Negative.   Endo/Heme/Allergies: Negative.   Psychiatric/Behavioral: Negative.     Physical Exam  Constitutional: She is oriented to person, place, and time. She appears well-developed and well-nourished.  Cardiovascular: Normal rate and regular rhythm.  Respiratory: Effort normal and breath sounds normal.  GI: Soft. Bowel sounds are normal.  Musculoskeletal: Normal range of motion.  Neurological: She is alert and oriented to person, place, and time.  Skin: Skin is warm and dry.  Psychiatric: She has a normal mood and affect.   BP (!) 145/55 (BP Location: Right Arm, Patient Position: Sitting, Cuff Size: Normal)   Pulse 78   Resp (!) 24   Ht 1.499 m (_0 )   Wt 113 lb (51.3 kg)   SpO2 95%   BMI 22.82 kg/m   Encounter Medications:   Outpatient Encounter Medications as of 10/22/2018  Medication Sig Note  . acetaminophen (TYLENOL) 500 MG tablet Take 500 mg by mouth every 6 (six) hours as needed for moderate pain.    Marland Kitchen amLODipine (NORVASC) 5 MG tablet Take 1 tablet (5 mg total) by mouth daily.   Marland Kitchen aspirin EC 325 MG tablet Take 1 tablet (325 mg total) by mouth daily.   . capsaicin (ZOSTRIX) 0.025 % cream APPLY TO AFFECTED AREA TWICE A DAY   . carvedilol (COREG) 12.5 MG tablet TAKE 1 TABLET BY MOUTH TWICE A DAY WITH A MEAL   . [facility] ASPIRIN EC 325 MG EC tablet TAKE 1  TABLET BY MOUTH EVERY DAY 41/93/7902: Duplicate order  . diclofenac sodium (VOLTAREN) 1 % GEL Apply 2 g topically 4 (four) times daily.   Marland Kitchen guaifenesin (ROBITUSSIN) 100 MG/5ML syrup Take 10 mLs (200 mg total) by mouth 3 (three) times daily as needed for cough.   . hydroxypropyl methylcellulose / hypromellose (ISOPTO TEARS / GONIOVISC) 2.5 % ophthalmic solution Place 1 drop 3 (three) times daily into both eyes.   Marland Kitchen loratadine (CLARITIN) 10 MG tablet TAKE 1/2 TABLET BY MOUTH DAILY (Patient taking differently: Take 5 mg by mouth once a day as needed for allergies)   . losartan (COZAAR) 50 MG tablet Take 1 tablet (50 mg total) by mouth daily.   . pantoprazole (PROTONIX) 40 MG tablet TAKE 1 TABLET BY MOUTH EVERY DAY   . PATADAY 0.2 % SOLN APPLY 1 DROP TO EYE DAILY.   Marland Kitchen phenazopyridine (PYRIDIUM) 200 MG tablet Take 1 tablet (200 mg total) by mouth 3 (three) times daily as needed for pain.   . ranitidine (ZANTAC) 150 MG tablet TAKE 1 TABLET BY MOUTH TWICE A DAY   . rosuvastatin (CRESTOR) 20 MG tablet TAKE 1 TABLET BY MOUTH EVERYDAY AT BEDTIME   . senna-docusate (SENOKOT-S) 8.6-50 MG tablet Take 2 tablets by mouth at bedtime. (Patient taking differently: Take 1 tablet by mouth every other day. )   . traMADol (ULTRAM) 50 MG tablet TAKE 0.5 TABLET BY MOUTH EVERY 6  HOURS AS NEEDED FOR PAIN   . albuterol (PROVENTIL HFA;VENTOLIN HFA) 108 (90 BASE) MCG/ACT inhaler Inhale 2 puffs into the lungs every 6 (six) hours as needed. For shortness of breath (Patient not taking: Reported on 10/22/2018)   . Amino Acids-Protein Hydrolys (FEEDING SUPPLEMENT, PRO-STAT SUGAR FREE 64,) LIQD Take 30 mLs by mouth 2 (two) times daily with a meal. ensure   . cephALEXin (KEFLEX) 500 MG capsule Take 500 mg by mouth 4 (four) times daily.   . silver sulfADIAZINE (SILVADENE) 1 % cream Apply 1 application topically daily. (Patient not taking: Reported on 10/22/2018)   . [DISCONTINUED] cetirizine (ZYRTEC) 5 MG tablet Take 1 tablet (5 mg  total) by mouth daily. Take at night time as it can cause some drowsiness    No facility-administered encounter medications on file as of 10/22/2018.     Functional Status:   In your present state of health, do you have any difficulty performing the following activities: 10/22/2018 08/27/2018  Hearing? N N  Vision? N N  Difficulty concentrating or making decisions? - N  Walking or climbing stairs? Y Y  Dressing or bathing? Tempie Donning  Comment Aide assists with ADLs daily -  Doing errands, shopping? Tempie Donning  Comment family assists with shopping and errands -  Conservation officer, nature and eating ? Y -  Using the Toilet? Y -  In the past six months, have you accidently leaked urine? Y -  Do you have problems with loss of bowel control? Y -  Managing your Medications? Y -  Managing your Finances? Y -  Housekeeping or managing your Housekeeping? Y -  Some recent data might be hidden    Fall/Depression Screening:    Fall Risk  10/22/2018 08/20/2018 07/23/2018  Falls in the past year? 0 No No  Number falls in past yr: - - -  Injury with Fall? - - -  Risk Factor Category  - - -  Risk for fall due to : - - -  Follow up - - -   PHQ 2/9 Scores 10/22/2018 07/23/2018 06/20/2018 04/26/2018 04/23/2018 03/11/2018 12/03/2017  PHQ - 2 Score 0 0 0 0 0 1 0  PHQ- 9 Score - - - - - - -  Exception Documentation - - - - - - -  Not completed - - - - - - -     Assessment:  Visited with member and member's daughter in the home. Ambulates with assistance only. Member lives at home with her children. Has Home Health PT 2 times a week and member states participation. Has PCS Aide to assist with ADLs. Family assists with IADLs. Family provides transportation assistance to appointments. Member stated need for new glasses; however, member's daughter [name] stated that member visited the Eye Doctor who stated that new glasses would not benefit member.  Plan: Will follow up with member next week as member is TOC. Advised to contact Case  Manager with any questions/concerns. SW referral sent as family requesting increased Personal Care Aide hours.   THN CM Care Plan Problem One     Most Recent Value  Care Plan Problem One  Risk for readmission as evidenced by recent hospital admission requiring SNF at discharge  Role Documenting the Problem One  Care Management [place] for Problem One  Active  [facility] Long Term Goal   Member will not have hospital admission or ED visits within the next 31 days  [place] Term Goal Start Date  10/04/18  Interventions for Problem One Long Term Goal  Educated member concerning s/s of heart attack and stroke. Teach back education.   THN CM Short Term Goal #1   Member will take medications as indicated within the next 3 weeks  [facility]N CM Short Term Goal #1 Start Date  10/04/18  Interventions for Short Term Goal #1  Reviewed medications in the home,  provided with new pill box  THN CM Short Term Goal #2   Member will attend follow up appointment with primary MD within the next 2 weeks  THN CM Short Term Goal #2 Start Date  10/04/18  [facility] CM Short Term Goal #2 Met Date  10/22/18  [facility] CM Short Term Goal #3  member/daughter will have plan for increased support  THN CM Short Term Goal #3 Start Date  10/22/18  Interventions for Short [place] Goal #3  SW referral for increased PCS hours    .  Benjamine Mola "ANN" Josiah Lobo, RN-BSN  [facility] Care Management  Community Care Management Coordinator  [phone] [email or web address]

## 2018-10-23 DIAGNOSIS — I110 Hypertensive heart disease with heart failure: Secondary | ICD-10-CM | POA: Diagnosis not present

## 2018-10-23 DIAGNOSIS — I2510 Atherosclerotic heart disease of native coronary artery without angina pectoris: Secondary | ICD-10-CM | POA: Diagnosis not present

## 2018-10-23 DIAGNOSIS — R0789 Other chest pain: Secondary | ICD-10-CM | POA: Diagnosis not present

## 2018-10-23 DIAGNOSIS — E119 Type 2 diabetes mellitus without complications: Secondary | ICD-10-CM | POA: Diagnosis not present

## 2018-10-23 DIAGNOSIS — I5030 Unspecified diastolic (congestive) heart failure: Secondary | ICD-10-CM | POA: Diagnosis not present

## 2018-10-23 DIAGNOSIS — D509 Iron deficiency anemia, unspecified: Secondary | ICD-10-CM | POA: Diagnosis not present

## 2018-10-24 DIAGNOSIS — E119 Type 2 diabetes mellitus without complications: Secondary | ICD-10-CM | POA: Diagnosis not present

## 2018-10-24 DIAGNOSIS — M1990 Unspecified osteoarthritis, unspecified site: Secondary | ICD-10-CM | POA: Diagnosis not present

## 2018-10-24 DIAGNOSIS — I10 Essential (primary) hypertension: Secondary | ICD-10-CM | POA: Diagnosis not present

## 2018-10-24 DIAGNOSIS — F0390 Unspecified dementia without behavioral disturbance: Secondary | ICD-10-CM | POA: Diagnosis not present

## 2018-10-24 DIAGNOSIS — I509 Heart failure, unspecified: Secondary | ICD-10-CM | POA: Diagnosis not present

## 2018-10-28 ENCOUNTER — Other Ambulatory Visit: Payer: Self-pay

## 2018-10-28 NOTE — Patient Outreach (Signed)
[facility] [facility]) Care Management  10/28/2018  [name] September 18, 1924 [id number]  BSW attempted to contact the patient on today's date to conduct a community resource consult. Unfortunately, today's call was unsuccessful. BSW left the patient a HIPAA compliant voice message requesting a return call.   Plan: BSW will mail the patient an unsuccessful outreach letter. BSW will attempt the patient again within the next four business days.  [name], BSW, CDP [facility] [phone]

## 2018-10-29 ENCOUNTER — Other Ambulatory Visit: Payer: Self-pay

## 2018-10-29 NOTE — Patient Outreach (Signed)
[place] [facility]) Care Management  10/29/2018  LEORA PLATT [birth year] [id number]  BSW received a voice message from the patients daughter, [name], returning this BSW's outreach call from yesterday (12/17). BSW placed a return call to [name]. Unfortunately, today's call was also unsuccessful. BSW left a HIPAA compliant voice message requesting a return call.  Plan: BSW will attempt a third outreach to the patient within the next four business days.   [name], BSW, CDP [facility] [phone]

## 2018-10-29 NOTE — Patient Outreach (Signed)
[name]) Care Management  10/29/2018  [name] February 28, 1924 [id number]  BSW received an incoming call from the patients daughter and caregiver [name], HIPAA identifiers confirmed. BSW spoke with [name] regarding the patients recent referral to this BSW for assistance with increasing PCS caregiver hours. [name] is in agreement with this referral and states "I don't get to get out of the house. I am constantly in the house".   Upon chart review it is noted by [facility] CSW [name] that a recent application was submitted to [name] by SNF social worker while patient was in rehab. It is reported the SNF social worker submitted an application requesting an increase in PCS hours. [name] denies any contact from [facility]. [name] further reports her mother is currently allotted 80 hours per month which consists of 4 hours a day Monday through Friday and 3 hours per day on the weekends.   BSW discussed with [name] that the patient may have met the maximum number of hours allotted to recipients of PCS services. [name] stated she would prefer to have 2-3 days per week with a caregiver staying in the home until at least 2:00 pm. [name] indicates the patient refuses other options such as adult day programs or long-term care. [name] is in agreement of BSW contacting [name] to follow up on patient application status. BSW explained that this BSW would outreach the patient and her daughter later this week to discuss findings.  BSW contacted [facility] and spoke with representative [name]. [name] confirms the patients is "maxed out" and is unable to increase PCS service hours considering 80 hours per month is the maximum amount this benefit allows.  Plan: BSW to outreach the patient and her daughter later this week to discuss findings.  [name], BSW, CDP [facility] [phone]

## 2018-10-30 ENCOUNTER — Other Ambulatory Visit: Payer: Self-pay

## 2018-10-30 NOTE — Patient Outreach (Signed)
[facility]) Care Management  10/30/2018  SWANNIE MILIUS 02-04-1924 [id number]   Attempted to call member/member's daughter [name] at preferred number; however, no answer and HIPPA Compliant voicemail message left.   Afterwards, received incoming call from [facility] daughter [name] who states member is doing well and denies any member complaints. Denies constipation.   [name] made aware that CM will call within 1 week and [name] verbalized understanding and agreement. [name] made aware that she may call CM with any questions or concerns.  Will call member within 1 week.   Benjamine Mola "ANN" Josiah Lobo, RN-BSN  [facility] Care Management  Community Care Management Coordinator  [phone] [email or web address]

## 2018-10-31 ENCOUNTER — Other Ambulatory Visit: Payer: Self-pay

## 2018-10-31 DIAGNOSIS — I2510 Atherosclerotic heart disease of native coronary artery without angina pectoris: Secondary | ICD-10-CM | POA: Diagnosis not present

## 2018-10-31 DIAGNOSIS — E119 Type 2 diabetes mellitus without complications: Secondary | ICD-10-CM | POA: Diagnosis not present

## 2018-10-31 DIAGNOSIS — D509 Iron deficiency anemia, unspecified: Secondary | ICD-10-CM | POA: Diagnosis not present

## 2018-10-31 DIAGNOSIS — R0789 Other chest pain: Secondary | ICD-10-CM | POA: Diagnosis not present

## 2018-10-31 DIAGNOSIS — I110 Hypertensive heart disease with heart failure: Secondary | ICD-10-CM | POA: Diagnosis not present

## 2018-10-31 DIAGNOSIS — I5030 Unspecified diastolic (congestive) heart failure: Secondary | ICD-10-CM | POA: Diagnosis not present

## 2018-10-31 NOTE — Patient Outreach (Signed)
[place] [facility]) Care Management  10/31/2018  [name] Jan 21, 1924 [id number]  Successful outreach to the patients daughter [name] whom is able to provide patients HIPAA identifiers. BSW informed [name] the patient is unable to increase PCS hours due to receiving the maximum amount. [name] stated understanding. BSW discussed applying for CAP/DA services. It is reported the patient is already on the waiting list. [name] reports the patient has refused [facility] or the idea of attending an adult day program. BSW encouraged [name] to seek out more help from family or friends to allow [place] respite time. [name] has declined [name] caregiver agencies due to cost.  BSW to plan a discipline closure as no other needs have been identified. [name] is in agreement with this and understands she may request social work involvement if needed. BSW to update [facility] RNCM of discipline closure.  [name], BSW, CDP [facility] [phone]

## 2018-11-04 DIAGNOSIS — R0789 Other chest pain: Secondary | ICD-10-CM | POA: Diagnosis not present

## 2018-11-04 DIAGNOSIS — E119 Type 2 diabetes mellitus without complications: Secondary | ICD-10-CM | POA: Diagnosis not present

## 2018-11-04 DIAGNOSIS — I5030 Unspecified diastolic (congestive) heart failure: Secondary | ICD-10-CM | POA: Diagnosis not present

## 2018-11-04 DIAGNOSIS — I2510 Atherosclerotic heart disease of native coronary artery without angina pectoris: Secondary | ICD-10-CM | POA: Diagnosis not present

## 2018-11-04 DIAGNOSIS — I110 Hypertensive heart disease with heart failure: Secondary | ICD-10-CM | POA: Diagnosis not present

## 2018-11-04 DIAGNOSIS — D509 Iron deficiency anemia, unspecified: Secondary | ICD-10-CM | POA: Diagnosis not present

## 2018-11-05 DIAGNOSIS — H40033 Anatomical narrow angle, bilateral: Secondary | ICD-10-CM | POA: Diagnosis not present

## 2018-11-05 DIAGNOSIS — E119 Type 2 diabetes mellitus without complications: Secondary | ICD-10-CM | POA: Diagnosis not present

## 2018-11-07 ENCOUNTER — Other Ambulatory Visit: Payer: Self-pay

## 2018-11-07 NOTE — Patient Outreach (Signed)
[place] [facility]) Care Management  11/07/2018  XOE HOE 04-15-1924 [id number]  Last TOC call scheduled for today to member/member's [name]. Called member's preferred number; however, no answer and HIPPA Compliant voice mail message left. Called secondary number and unable to leave HIPPA compliant voice mail.   Will await call from member/member's daughter [name] and if no return call then will return call to member in 3-4 business days.   Benjamine Mola "ANN" Josiah Lobo, RN-BSN  [facility] Care Management  Community Care Management Coordinator  [phone] [email or web address]

## 2018-11-12 ENCOUNTER — Other Ambulatory Visit: Payer: Self-pay

## 2018-11-12 NOTE — Patient Outreach (Addendum)
[facility] [facility]) Care Management  11/12/2018  DISA RIEDLINGER Oct 27, 1924 [id number]   2nd unsuccessful attempt for last TOC call scheduled for today to member/member's daughter [name]. Called member's preferred number; however, no answer and HIPPA Compliant voice mail message left. Called secondary number and unable to leave HIPPA compliant voice mail.   Will send Unsuccessful Outreach letter today. Will await call from member/member's daughter [name] and if no return call then will return call to member in 3-4 business days.    Benjamine Mola "ANN" Josiah Lobo, RN-BSN  [facility] Care Management  Community Care Management Coordinator  [phone] [email or web address]

## 2018-11-13 ENCOUNTER — Encounter: Payer: Self-pay | Admitting: Podiatry

## 2018-11-13 NOTE — Progress Notes (Signed)
Subjective: [name] presents today with history of neuropathy with cc of painful, mycotic toenails.  Pain is aggravated when wearing enclosed shoe gear and relieved with periodic professional debridement.  Patient has h/o heel ulcerations which have healed.  Bradford Bing, DO is her PCP and last dos ws 07/23/2018.   Current Outpatient Medications:  .  acetaminophen (TYLENOL) 500 MG tablet, Take 500 mg by mouth every 6 (six) hours as needed for moderate pain. , Disp: , Rfl:  .  albuterol (PROVENTIL HFA;VENTOLIN HFA) 108 (90 BASE) MCG/ACT inhaler, Inhale 2 puffs into the lungs every 6 (six) hours as needed. For shortness of breath (Patient not taking: Reported on 10/22/2018), Disp: 1 Inhaler, Rfl: 6 .  Amino Acids-Protein Hydrolys (FEEDING SUPPLEMENT, PRO-STAT SUGAR FREE 64,) LIQD, Take 30 mLs by mouth 2 (two) times daily with a meal. ensure, Disp: , Rfl:  .  amLODipine (NORVASC) 5 MG tablet, Take 1 tablet (5 mg total) by mouth daily., Disp: 90 tablet, Rfl: 3 .  aspirin EC 325 MG tablet, Take 1 tablet (325 mg total) by mouth daily., Disp: 90 tablet, Rfl: 3 .  capsaicin (ZOSTRIX) 0.025 % cream, APPLY TO AFFECTED AREA TWICE A DAY, Disp: 60 g, Rfl: 1 .  carvedilol (COREG) 12.5 MG tablet, TAKE 1 TABLET BY MOUTH TWICE A DAY WITH A MEAL, Disp: 90 tablet, Rfl: 3 .  [facility] ASPIRIN EC 325 MG EC tablet, TAKE 1 TABLET BY MOUTH EVERY DAY, Disp: , Rfl: 3 .  diclofenac sodium (VOLTAREN) 1 % GEL, Apply 2 g topically 4 (four) times daily., Disp: 1 Tube, Rfl: 0 .  guaifenesin (ROBITUSSIN) 100 MG/5ML syrup, Take 10 mLs (200 mg total) by mouth 3 (three) times daily as needed for cough., Disp: 120 mL, Rfl: 0 .  hydroxypropyl methylcellulose / hypromellose (ISOPTO TEARS / GONIOVISC) 2.5 % ophthalmic solution, Place 1 drop 3 (three) times daily into both eyes., Disp: 15 mL, Rfl: 12 .  loratadine (CLARITIN) 10 MG tablet, TAKE 1/2 TABLET BY MOUTH DAILY (Patient taking differently: Take 5 mg by mouth once a day as  needed for allergies), Disp: 15 tablet, Rfl: 0 .  losartan (COZAAR) 50 MG tablet, Take 1 tablet (50 mg total) by mouth daily., Disp: 90 tablet, Rfl: 3 .  pantoprazole (PROTONIX) 40 MG tablet, TAKE 1 TABLET BY MOUTH EVERY DAY, Disp: 90 tablet, Rfl: 3 .  PATADAY 0.2 % SOLN, APPLY 1 DROP TO EYE DAILY., Disp: 2.5 mL, Rfl: 0 .  phenazopyridine (PYRIDIUM) 200 MG tablet, Take 1 tablet (200 mg total) by mouth 3 (three) times daily as needed for pain., Disp: 10 tablet, Rfl: 0 .  ranitidine (ZANTAC) 150 MG tablet, TAKE 1 TABLET BY MOUTH TWICE A DAY, Disp: 180 tablet, Rfl: 1 .  rosuvastatin (CRESTOR) 20 MG tablet, TAKE 1 TABLET BY MOUTH EVERYDAY AT BEDTIME, Disp: 90 tablet, Rfl: 2 .  senna-docusate (SENOKOT-S) 8.6-50 MG tablet, Take 2 tablets by mouth at bedtime. (Patient taking differently: Take 1 tablet by mouth every other day. ), Disp: 60 tablet, Rfl: 3 .  silver sulfADIAZINE (SILVADENE) 1 % cream, Apply 1 application topically daily. (Patient not taking: Reported on 10/22/2018), Disp: 50 g, Rfl: 0 .  traMADol (ULTRAM) 50 MG tablet, TAKE 0.5 TABLET BY MOUTH EVERY 6 HOURS AS NEEDED FOR PAIN, Disp: , Rfl: 0 .  cephALEXin (KEFLEX) 500 MG capsule, Take 500 mg by mouth 4 (four) times daily., Disp: , Rfl:   Allergies  Allergen Reactions  . Tape Other (  See Comments)    Skin is very thin and will tear and bruise easily!!   . Lisinopril Cough    Objective:  Vascular Examination: Capillary refill time delayed x 10 digits Dorsalis pedis and Posterior tibial pulses are faintly palpable b/l Digital hair x 10 digits was absent Skin temperature gradient WNL b/l  Dermatological Examination: Age related skin atrophy noted b/l  Toenails 1-5 b/l discolored, thick, dystrophic with subungual debris and pain with palpation to nailbeds due to thickness of nails.  Noted favored sleeping position appears to be with feet externally rotated and minute callus build up on posterolateral heel left foot. No flocculence,  no erythema, no edema, no warmth.  Musculoskeletal: Muscle strength 5/5 to all muscle groups b/l  Neurological: Sensation with 10 gram monofilament is intact b/l Vibratory sensation absent b/l  Assessment: 1. Painful onychomycosis toenails 1-5 b/l 2. Heel callus left foot 3. NIDDM with neuropathy and PAD  Plan: 1. Toenails 1-5 b/l were debrided in length and girth without iatrogenic bleeding. 2. Callus gently filed. Encouraged use of heel protectors or float heels when in bed. 3. Patient to continue soft, supportive shoe gear 4. Patient to report any pedal injuries to medical professional  5. Follow up 3 months. Patient/POA to call should there be a concern in the interim.

## 2018-11-15 DIAGNOSIS — E119 Type 2 diabetes mellitus without complications: Secondary | ICD-10-CM | POA: Diagnosis not present

## 2018-11-15 DIAGNOSIS — I2510 Atherosclerotic heart disease of native coronary artery without angina pectoris: Secondary | ICD-10-CM | POA: Diagnosis not present

## 2018-11-15 DIAGNOSIS — R0789 Other chest pain: Secondary | ICD-10-CM | POA: Diagnosis not present

## 2018-11-15 DIAGNOSIS — I5030 Unspecified diastolic (congestive) heart failure: Secondary | ICD-10-CM | POA: Diagnosis not present

## 2018-11-15 DIAGNOSIS — D509 Iron deficiency anemia, unspecified: Secondary | ICD-10-CM | POA: Diagnosis not present

## 2018-11-15 DIAGNOSIS — I110 Hypertensive heart disease with heart failure: Secondary | ICD-10-CM | POA: Diagnosis not present

## 2018-11-19 ENCOUNTER — Other Ambulatory Visit: Payer: Self-pay

## 2018-11-19 ENCOUNTER — Ambulatory Visit: Payer: Self-pay

## 2018-11-19 NOTE — Patient Outreach (Signed)
[facility]) Care Management  11/19/2018  [name] 11/03/1924 [id number]  3rd unsuccessful attempt for last TOC call scheduled for today to member/member's daughter [name]. Called member's preferred number; however, no answer and HIPPA Compliant voice mail message left. Called secondary number and unable to leave HIPPA compliant voice mail.   Will close on November 22, 2018 if no return call from member/member's daughter [name].   Benjamine Mola "ANN" Josiah Lobo, RN-BSN  [facility] Care Management  Community Care Management Coordinator  [phone] [email or web address]

## 2018-11-22 ENCOUNTER — Other Ambulatory Visit: Payer: Self-pay | Admitting: *Deleted

## 2018-11-22 NOTE — Patient Outreach (Signed)
[facility]) Care Management  11/22/2018  NYSA SARIN 1924/02/13 [id number]   No response from member/family after multiple unsuccessful outreach attempts and letter sent.  Will close case at this time due to inability to maintain contact.  Will notify member and primary MD of case closure.  [name], [place], MSN [place] [phone]

## 2018-11-27 ENCOUNTER — Telehealth: Payer: Self-pay | Admitting: *Deleted

## 2018-11-27 ENCOUNTER — Emergency Department (HOSPITAL_COMMUNITY)
Admission: EM | Admit: 2018-11-27 | Discharge: 2018-11-27 | Disposition: A | Discharge status: Home / Self Care | Payer: Medicare Other | Attending: Emergency Medicine | Admitting: Emergency Medicine

## 2018-11-27 ENCOUNTER — Emergency Department (HOSPITAL_COMMUNITY): Payer: Medicare Other

## 2018-11-27 ENCOUNTER — Encounter (HOSPITAL_COMMUNITY): Payer: Self-pay | Admitting: *Deleted

## 2018-11-27 DIAGNOSIS — R05 Cough: Secondary | ICD-10-CM | POA: Diagnosis not present

## 2018-11-27 DIAGNOSIS — Z79899 Other long term (current) drug therapy: Secondary | ICD-10-CM | POA: Insufficient documentation

## 2018-11-27 DIAGNOSIS — Z86718 Personal history of other venous thrombosis and embolism: Secondary | ICD-10-CM | POA: Insufficient documentation

## 2018-11-27 DIAGNOSIS — I509 Heart failure, unspecified: Secondary | ICD-10-CM | POA: Insufficient documentation

## 2018-11-27 DIAGNOSIS — E785 Hyperlipidemia, unspecified: Secondary | ICD-10-CM | POA: Insufficient documentation

## 2018-11-27 DIAGNOSIS — R5381 Other malaise: Secondary | ICD-10-CM | POA: Diagnosis not present

## 2018-11-27 DIAGNOSIS — I10 Essential (primary) hypertension: Secondary | ICD-10-CM | POA: Diagnosis not present

## 2018-11-27 DIAGNOSIS — N3000 Acute cystitis without hematuria: Secondary | ICD-10-CM

## 2018-11-27 DIAGNOSIS — E119 Type 2 diabetes mellitus without complications: Secondary | ICD-10-CM | POA: Insufficient documentation

## 2018-11-27 DIAGNOSIS — R51 Headache: Secondary | ICD-10-CM | POA: Diagnosis not present

## 2018-11-27 DIAGNOSIS — I110 Hypertensive heart disease with heart failure: Secondary | ICD-10-CM | POA: Insufficient documentation

## 2018-11-27 DIAGNOSIS — Z7982 Long term (current) use of aspirin: Secondary | ICD-10-CM | POA: Diagnosis not present

## 2018-11-27 DIAGNOSIS — I2510 Atherosclerotic heart disease of native coronary artery without angina pectoris: Secondary | ICD-10-CM | POA: Insufficient documentation

## 2018-11-27 DIAGNOSIS — I252 Old myocardial infarction: Secondary | ICD-10-CM | POA: Insufficient documentation

## 2018-11-27 DIAGNOSIS — Z8673 Personal history of transient ischemic attack (TIA), and cerebral infarction without residual deficits: Secondary | ICD-10-CM | POA: Diagnosis not present

## 2018-11-27 DIAGNOSIS — J45909 Unspecified asthma, uncomplicated: Secondary | ICD-10-CM | POA: Diagnosis not present

## 2018-11-27 DIAGNOSIS — R079 Chest pain, unspecified: Secondary | ICD-10-CM | POA: Diagnosis present

## 2018-11-27 DIAGNOSIS — R0602 Shortness of breath: Secondary | ICD-10-CM | POA: Diagnosis not present

## 2018-11-27 DIAGNOSIS — Z853 Personal history of malignant neoplasm of breast: Secondary | ICD-10-CM | POA: Insufficient documentation

## 2018-11-27 LAB — URINALYSIS, ROUTINE W REFLEX MICROSCOPIC
Bilirubin Urine: NEGATIVE
Glucose, UA: NEGATIVE mg/dL
Ketones, ur: NEGATIVE mg/dL
Nitrite: NEGATIVE
PROTEIN: 30 mg/dL — AB
Specific Gravity, Urine: 1.008 (ref 1.005–1.030)
pH: 8 (ref 5.0–8.0)

## 2018-11-27 LAB — COMPREHENSIVE METABOLIC PANEL
ALT: 9 U/L (ref 0–44)
AST: 18 U/L (ref 15–41)
Albumin: 3.6 g/dL (ref 3.5–5.0)
Alkaline Phosphatase: 64 U/L (ref 38–126)
Anion gap: 9 (ref 5–15)
BUN: 9 mg/dL (ref 8–23)
CO2: 24 mmol/L (ref 22–32)
Calcium: 11.1 mg/dL — ABNORMAL HIGH (ref 8.9–10.3)
Chloride: 109 mmol/L (ref 98–111)
Creatinine, Ser: 0.97 mg/dL (ref 0.44–1.00)
GFR calc Af Amer: 59 mL/min — ABNORMAL LOW (ref >60)
GFR calc non Af Amer: 51 mL/min — ABNORMAL LOW (ref >60)
Glucose, Bld: 144 mg/dL — ABNORMAL HIGH (ref 70–99)
Potassium: 3.4 mmol/L — ABNORMAL LOW (ref 3.5–5.1)
Sodium: 142 mmol/L (ref 135–145)
Total Bilirubin: 0.7 mg/dL (ref 0.3–1.2)
Total Protein: 8.3 g/dL — ABNORMAL HIGH (ref 6.5–8.1)

## 2018-11-27 LAB — CBC WITH DIFFERENTIAL/PLATELET
Abs Immature Granulocytes: 0 10*3/uL (ref 0.00–0.07)
Basophils Absolute: 0 10*3/uL (ref 0.0–0.1)
Basophils Relative: 1 %
Eosinophils Absolute: 0.1 10*3/uL (ref 0.0–0.5)
Eosinophils Relative: 4 %
HEMATOCRIT: 41.8 % (ref 36.0–46.0)
Hemoglobin: 12.5 g/dL (ref 12.0–15.0)
Immature Granulocytes: 0 %
LYMPHS ABS: 1.4 10*3/uL (ref 0.7–4.0)
Lymphocytes Relative: 44 %
MCH: 22.3 pg — ABNORMAL LOW (ref 26.0–34.0)
MCHC: 29.9 g/dL — ABNORMAL LOW (ref 30.0–36.0)
MCV: 74.5 fL — ABNORMAL LOW (ref 80.0–100.0)
Monocytes Absolute: 0.2 10*3/uL (ref 0.1–1.0)
Monocytes Relative: 7 %
Neutro Abs: 1.5 10*3/uL — ABNORMAL LOW (ref 1.7–7.7)
Neutrophils Relative %: 44 %
Platelets: 128 10*3/uL — ABNORMAL LOW (ref 150–400)
RBC: 5.61 MIL/uL — ABNORMAL HIGH (ref 3.87–5.11)
RDW: 16.7 % — ABNORMAL HIGH (ref 11.5–15.5)
WBC: 3.3 10*3/uL — ABNORMAL LOW (ref 4.0–10.5)
nRBC: 0 % (ref 0.0–0.2)

## 2018-11-27 LAB — GROUP A STREP BY PCR: Group A Strep by PCR: NOT DETECTED

## 2018-11-27 LAB — BRAIN NATRIURETIC PEPTIDE: B Natriuretic Peptide: 103.4 pg/mL — ABNORMAL HIGH (ref 0.0–100.0)

## 2018-11-27 LAB — I-STAT TROPONIN, ED: Troponin i, poc: 0.02 ng/mL (ref 0.00–0.08)

## 2018-11-27 LAB — LIPASE, BLOOD: Lipase: 51 U/L (ref 11–51)

## 2018-11-27 MED ORDER — CEPHALEXIN 500 MG PO CAPS: 500.0000 mg | ORAL_CAPSULE | Freq: Four times a day (QID) | ORAL | 0 refills | Status: DC

## 2018-11-27 MED ORDER — SODIUM CHLORIDE 0.9 % IV SOLN
1.0000 g | Freq: Once | INTRAVENOUS | Status: AC
  Administered 2018-11-27: 1 g via INTRAVENOUS
  Filled 2018-11-27: qty 10

## 2018-11-27 NOTE — ED Notes (Signed)
Pt alert and oriented in NAD. Pt and family verbalized understanding of discharge instructions. Dtr in law is transporting pt home.

## 2018-11-27 NOTE — ED Triage Notes (Signed)
Pt in from home via [facility], c/o sore throat and headache with cough, pt reports "just not feeling well", pt a neuro baseline, A&O x4

## 2018-11-27 NOTE — ED Notes (Signed)
Unsuccessful IV attempt. Phlebotomy at bedside, unsuccessful blood draw attempt. IV consult ordered.

## 2018-11-27 NOTE — Telephone Encounter (Signed)
Noted. I agree with plan.   [name], [place], PGY-3

## 2018-11-27 NOTE — Telephone Encounter (Signed)
Patient calls to say her right side of her face is numb and she wants to know what to do.  On the phone it sounds like pt is slurring her words.  I asked her to smile and see if both sides of her mouth go up.  She tells me that no her right side "is going down"  Advised to her and CNA (with her now) to hang up and call 911.   Pt is agreeable to plan.  Letisia Schwalb, [name], CMA

## 2018-11-27 NOTE — ED Provider Notes (Signed)
[facility] EMERGENCY DEPARTMENT Provider Note   CSN: [id number] Arrival date & time: 11/27/18  1106     History   Chief Complaint No chief complaint on file.   HPI [name] is a 85 y.o. female.  Pt presents to the ED today with right sided cp, cough, sore throat.  She put some "pain cream" on her chest wall which has helped the right sided pain.  The pt denies any fevers.     Past Medical History:  Diagnosis Date  . Allergy   . Anemia    Iron def anemia  . Arthritis   . Asthma   . Breast cancer ([place]) 10/2010   Invasive Ductal Carcinoma, s/p bilateral mastectomy, now on Tamoxifen. Followed by Dr Jamse Arn.   . Breast cancer ([place]) 2011   s/p Bilateral masectomy  . CAD (coronary artery disease)   . Carotid artery aneurysm ([place]) 08/2010   right ICA, 5 x 59mm  . CHF (congestive heart failure) (HCC)    Systolic   . Colon cancer ([place])   . Colon cancer ([place]) 12/11/2013  . DDD (degenerative disc disease), cervical   . Diabetes mellitus   . Diverticulosis   . GERD (gastroesophageal reflux disease)   . H/O: GI bleed   . Hiatal hernia   . History of colon cancer   . Hypercalcemia 06/09/2014  . Hyperlipidemia   . Hypertension   . Myocardial infarction ([place])   . Neuropathy   . Pneumonia 03/2012  . Pressure injury of heel, stage 2 ([place]) 08/31/2016  . Shortness of breath     Patient Active Problem List   Diagnosis Date Noted  . History of deep vein thrombosis 10/17/2018  . Severe protein-calorie malnutrition Altamease Oiler: less than 60% of standard weight) ([place]) 10/17/2018  . Disorder associated with well-controlled type 2 diabetes mellitus ([place]) 10/17/2018  . History of pulmonary embolus (PE) 10/17/2018  . Hypomagnesemia 08/27/2018  . Left ventricular hypertrophy by electrocardiogram 08/27/2018  . Atypical chest pain 08/26/2018  . Advanced nonexudative age-related macular degeneration of both eyes without subfoveal involvement 07/30/2018  . Altered mental  status 06/06/2018  . Microcytic anemia   . TIA (transient ischemic attack) 06/03/2018  . Mobility impaired 04/26/2018  . Protein-calorie malnutrition, severe 02/06/2017  . Urinary tract infection without hematuria 02/03/2017  . Hypokalemia 10/13/2016  . Pressure injury of heel, stage 2 ([place]) 08/31/2016  . Pulmonary embolus ([place]) 08/30/2016  . Dementia without behavioral disturbance ([place]) 06/01/2016  . Depression 01/20/2016  . Suicidal ideation 01/19/2016  . Prolapse of female pelvic organs 12/28/2015  . Leg weakness, bilateral 12/08/2015  . Weakness of right lower extremity 11/25/2015  . Dry eyes 10/28/2015  . (HFpEF) heart failure with preserved ejection fraction ([place])   . Gout 05/14/2015  . Delirium secondary to multiple medical problems 04/15/2015  . Primary hypertension   . Seasonal allergies 02/10/2015  . Stroke-like symptoms   . Stroke-like episode ([place]) 01/25/2015  . Syncope and collapse 01/25/2015  . Dizziness 09/19/2014  . Anemia of chronic illness 08/19/2014  . Acute superficial venous thrombosis of right lower extremity 08/19/2014  . Hypercalcemia 06/09/2014  . Breast cancer, left breast ([place]) 06/09/2014  . Breast cancer, right breast ([place]) 06/09/2014  . Edema of left lower extremity 03/30/2014  . PAD (peripheral artery disease) ([place]) 03/30/2014  . History of colon cancer 12/11/2013  . History of fall 05/15/2013  . Stricture and stenosis of esophagus 10/27/2012  . Thoracic back pain 05/24/2012  .  Tobacco use disorder 06/02/2011  . Post-mastectomy pain 05/02/2011  . Hiatal hernia 03/22/2011  . 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 IDA (iron deficiency anemia) 04/29/2007  . Controlled type 2 diabetes mellitus with diabetic neuropathy, without long-term current use of insulin ([place]) 01/10/2007  . Coronary atherosclerosis 01/10/2007  . Asthma 01/10/2007  . Reflux esophagitis 01/10/2007  . Osteoarthritis, multiple sites 01/10/2007    Past Surgical History:   Procedure Laterality Date  . Breast masectomy  10/2010   Bilateral masectomy by Dr Lucia Gaskins s/p invasive ductal carcinoma.  Marland Kitchen BREAST SURGERY    . Cardiac  Catherization  2007   Severe 3-vessel disease.  EF 20-25%.  . ESOPHAGOGASTRODUODENOSCOPY  2001   Esophageal Tear  . ESOPHAGOGASTRODUODENOSCOPY  10/27/2012   Procedure: ESOPHAGOGASTRODUODENOSCOPY (EGD);  Surgeon: Irene Shipper, MD;  Location[facility] ENDOSCOPY;  Service: Endoscopy;  Laterality: N/A;  pat  . JOINT REPLACEMENT Right 2001   Knee  . TOTAL KNEE ARTHROPLASTY  2001     OB History   No obstetric history on file.      Home Medications    Prior to Admission medications   Medication Sig Start Date End Date Taking? Authorizing Provider  carvedilol (COREG) 12.5 MG tablet TAKE 1 TABLET BY MOUTH TWICE A DAY WITH A MEAL Patient taking differently: Take 12.5 mg by mouth 2 (two) times daily with a meal.  07/23/18  Yes [place] Bing, DO  acetaminophen (TYLENOL) 500 MG tablet Take 500 mg by mouth every 6 (six) hours as needed for moderate pain.     [provider]  albuterol (PROVENTIL HFA;VENTOLIN HFA) 108 (90 BASE) MCG/ACT inhaler Inhale 2 puffs into the lungs every 6 (six) hours as needed. For shortness of breath Patient not taking: Reported on 10/22/2018 01/18/15   [name], MD  Amino Acids-Protein Hydrolys (FEEDING SUPPLEMENT, PRO-STAT SUGAR FREE 64,) LIQD Take 30 mLs by mouth 2 (two) times daily with a meal. ensure    [provider]  amLODipine (NORVASC) 5 MG tablet Take 1 tablet (5 mg total) by mouth daily. 07/23/18   [place] Bing, DO  aspirin EC 325 MG tablet Take 1 tablet (325 mg total) by mouth daily. 07/05/18   [place] Bing, DO  capsaicin (ZOSTRIX) 0.025 % cream APPLY TO AFFECTED AREA TWICE A DAY 04/11/16   [name], MD  cephALEXin (KEFLEX) 500 MG capsule Take 1 capsule (500 mg total) by mouth 4 (four) times daily. 11/27/18   Isla Pence, MD  [facility] ASPIRIN EC 325 MG EC tablet TAKE 1  TABLET BY MOUTH EVERY DAY 10/01/18   [provider]  diclofenac sodium (VOLTAREN) 1 % GEL Apply 2 g topically 4 (four) times daily. Patient not taking: Reported on 11/27/2018 08/29/18   Meccariello, Bernita Raisin, DO  guaifenesin (ROBITUSSIN) 100 MG/5ML syrup Take 10 mLs (200 mg total) by mouth 3 (three) times daily as needed for cough. 08/15/16   [name], MD  hydroxypropyl methylcellulose / hypromellose (ISOPTO TEARS / GONIOVISC) 2.5 % ophthalmic solution Place 1 drop 3 (three) times daily into both eyes. 09/28/17   [place] Bing, DO  loratadine (CLARITIN) 10 MG tablet TAKE 1/2 TABLET BY MOUTH DAILY Patient taking differently: Take 5 mg by mouth once a day as needed for allergies 12/08/15   [name], MD  losartan (COZAAR) 50 MG tablet Take 1 tablet (50 mg total) by mouth daily. 07/23/18   [place] Bing, DO  pantoprazole (PROTONIX) 40 MG tablet  TAKE 1 TABLET BY MOUTH EVERY DAY 10/14/18   [place] Bing, DO  PATADAY 0.2 % SOLN APPLY 1 DROP TO EYE DAILY. 11/23/15   [name], MD  phenazopyridine (PYRIDIUM) 200 MG tablet Take 1 tablet (200 mg total) by mouth 3 (three) times daily as needed for pain. 08/08/18   [place] Bing, DO  ranitidine (ZANTAC) 150 MG tablet TAKE 1 TABLET BY MOUTH TWICE A DAY 07/29/18   [place] Bing, DO  rosuvastatin (CRESTOR) 20 MG tablet TAKE 1 TABLET BY MOUTH EVERYDAY AT BEDTIME 09/09/18   [place] Bing, DO  senna-docusate (SENOKOT-S) 8.6-50 MG tablet Take 2 tablets by mouth at bedtime. Patient taking differently: Take 1 tablet by mouth every other day.  04/23/17   Virginia Crews, MD  silver sulfADIAZINE (SILVADENE) 1 % cream Apply 1 application topically daily. Patient not taking: Reported on 10/22/2018 05/15/17   Gean Birchwood, DPM  traMADol (ULTRAM) 50 MG tablet TAKE 0.5 TABLET BY MOUTH EVERY 6 HOURS AS NEEDED FOR PAIN 10/05/18   [provider]  cetirizine (ZYRTEC) 5 MG tablet Take 1 tablet (5 mg total) by  mouth daily. Take at night time as it can cause some drowsiness 05/24/12 07/09/12  [place], Burnell Blanks, MD    Family History Family History  Problem Relation Age of Onset  . Sickle cell anemia Other   . Heart disease Mother   . Heart disease Father   . Cancer Daughter 29       breast ca  . Hypertension Daughter   . Cancer Daughter 44       breast ca  . Hypertension Daughter   . Heart disease Son        Poor circulation-Left Leg  . Diabetes Daughter   . Hypertension Daughter   . Hyperlipidemia Daughter        Poor circulation- Toe amputation    Social History Social History   Tobacco Use  . Smoking status: Never Smoker  . Smokeless tobacco: Current User    Types: Snuff  Substance Use Topics  . Alcohol use: No  . Drug use: No     Allergies   Tape and Lisinopril   Review of Systems Review of Systems  HENT: Positive for sore throat.   Respiratory: Positive for cough.   Musculoskeletal:       Right sided cp  All other systems reviewed and are negative.    Physical Exam Updated Vital Signs BP (!) 171/69   Pulse (!) 59   Temp 97.7 F (36.5 C) (Oral)   Resp 15   SpO2 100%   Physical Exam Vitals signs and nursing note reviewed.  Constitutional:      Appearance: Normal appearance.  HENT:     Head: Normocephalic and atraumatic.     Right Ear: External ear normal.     Left Ear: External ear normal.     Nose: Nose normal.     Mouth/Throat:     Mouth: Mucous membranes are moist.     Pharynx: Oropharynx is clear.  Eyes:     Extraocular Movements: Extraocular movements intact.     Conjunctiva/sclera: Conjunctivae normal.  Neck:     Musculoskeletal: Normal range of motion and neck supple.  Cardiovascular:     Rate and Rhythm: Normal rate and regular rhythm.  Pulmonary:     Effort: Pulmonary effort is normal.     Breath sounds: Normal breath sounds.  Chest:     Comments: Bilateral  mastectomy Abdominal:     General: Abdomen is flat. Bowel sounds are  normal.     Palpations: Abdomen is soft.  Musculoskeletal: Normal range of motion.  Skin:    General: Skin is warm and dry.     Capillary Refill: Capillary refill takes less than 2 seconds.  Neurological:     General: No focal deficit present.     Mental Status: She is alert and oriented to person, place, and time.  Psychiatric:        Mood and Affect: Mood normal.        Behavior: Behavior normal.      ED Treatments / Results  Labs (all labs ordered are listed, but only abnormal results are displayed) Labs Reviewed  CBC WITH DIFFERENTIAL/PLATELET - Abnormal; Notable for the following components:      Result Value   WBC 3.3 (*)    RBC 5.61 (*)    MCV 74.5 (*)    MCH 22.3 (*)    MCHC 29.9 (*)    RDW 16.7 (*)    Platelets 128 (*)    Neutro Abs 1.5 (*)    All other components within normal limits  BRAIN NATRIURETIC PEPTIDE - Abnormal; Notable for the following components:   B Natriuretic Peptide 103.4 (*)    All other components within normal limits  COMPREHENSIVE METABOLIC PANEL - Abnormal; Notable for the following components:   Potassium 3.4 (*)    Glucose, Bld 144 (*)    Calcium 11.1 (*)    Total Protein 8.3 (*)    GFR calc non Af Amer 51 (*)    GFR calc Af Amer 59 (*)    All other components within normal limits  URINALYSIS, ROUTINE W REFLEX MICROSCOPIC - Abnormal; Notable for the following components:   APPearance TURBID (*)    Hgb urine dipstick MODERATE (*)    Protein, ur 30 (*)    Leukocytes, UA SMALL (*)    Bacteria, UA MANY (*)    All other components within normal limits  GROUP A STREP BY PCR  LIPASE, BLOOD  I-STAT TROPONIN, ED    EKG EKG Interpretation  Date/Time:  Wednesday November 27 2018 11:31:32 EST Ventricular Rate:  62 PR Interval:    QRS Duration: 78 QT Interval:  447 QTC Calculation: 454 R Axis:   -21 Text Interpretation:  Sinus rhythm LVH with secondary repolarization abnormality Baseline wander in lead(s) V3 No significant change  since last tracing Confirmed by Isla Pence [phone]) on 11/27/2018 11:57:29 AM Also confirmed by Isla Pence [phone]), editor Philomena Doheny [phone])  on 11/27/2018 2:51:11 PM   Radiology Dg Chest 2 View  Result Date: 11/27/2018 CLINICAL DATA:  Shortness of breath and cough EXAM: CHEST - 2 VIEW COMPARISON:  08/26/2018 FINDINGS: Cardiac shadow is stable. Aortic calcifications are again seen. Lungs are well aerated bilaterally. No focal infiltrate or sizable effusion is noted. No acute bony abnormality is noted. Degenerative changes of thoracic spine seen. IMPRESSION: No acute abnormality noted. Electronically Signed   By: Inez Catalina M.D.   On: 11/27/2018 13:13    Procedures Procedures (including critical care time)  Medications Ordered in ED Medications  cefTRIAXone (ROCEPHIN) 1 g in sodium chloride 0.9 % 100 mL IVPB (0 g Intravenous Stopped 11/27/18 1436)     Initial Impression / Assessment and Plan / ED Course  I have reviewed the triage vital signs and the nursing notes.  Pertinent labs & imaging results that were available during my care  of the patient were reviewed by me and considered in my medical decision making (see chart for details).    Pt does have a UTI.  She was given a dose of rocephin in ED.  She will be d/c home with keflex.  Otherwise, work up is normal.  Pt is stable for d/c.  Final Clinical Impressions(s) / ED Diagnoses   Final diagnoses:  Acute cystitis without hematuria    ED Discharge Orders         Ordered    cephALEXin (KEFLEX) 500 MG capsule  4 times daily     11/27/18 1530           Isla Pence, MD 11/27/18 1531

## 2018-12-21 DIAGNOSIS — E782 Mixed hyperlipidemia: Secondary | ICD-10-CM | POA: Diagnosis not present

## 2018-12-21 DIAGNOSIS — E119 Type 2 diabetes mellitus without complications: Secondary | ICD-10-CM | POA: Diagnosis not present

## 2018-12-21 DIAGNOSIS — I10 Essential (primary) hypertension: Secondary | ICD-10-CM | POA: Diagnosis not present

## 2018-12-21 DIAGNOSIS — I509 Heart failure, unspecified: Secondary | ICD-10-CM | POA: Diagnosis not present

## 2018-12-21 DIAGNOSIS — K219 Gastro-esophageal reflux disease without esophagitis: Secondary | ICD-10-CM | POA: Diagnosis not present

## 2018-12-26 ENCOUNTER — Encounter: Payer: Self-pay | Admitting: Family Medicine

## 2018-12-26 ENCOUNTER — Ambulatory Visit (INDEPENDENT_AMBULATORY_CARE_PROVIDER_SITE_OTHER): Payer: Medicare Other | Admitting: Family Medicine

## 2018-12-26 VITALS — BP 132/78 | HR 44 | Temp 99.2°F | Ht 59.0 in | Wt 109.6 lb

## 2018-12-26 DIAGNOSIS — K068 Other specified disorders of gingiva and edentulous alveolar ridge: Secondary | ICD-10-CM | POA: Diagnosis not present

## 2018-12-26 DIAGNOSIS — N644 Mastodynia: Secondary | ICD-10-CM | POA: Diagnosis not present

## 2018-12-26 DIAGNOSIS — G8929 Other chronic pain: Secondary | ICD-10-CM

## 2018-12-26 NOTE — Assessment & Plan Note (Signed)
Consistent with chronic musculoskeletal pain related to her prior mastectomies.  Low concern for ACS or PE at this time.  No signs of malignancy recurrence. - Continue Tylenol, Voltaren gel, and elastic band around chest as needed - Discourage use of tramadol given age - Reviewed return precautions, RTC as needed

## 2018-12-26 NOTE — Assessment & Plan Note (Signed)
Uncertain etiology.  Suspect dentures are likely playing a role.  No signs of abscess or skin breakdown on mucosal surfaces.  Does not seem to be involving TMJ. - Advised patient to set up appointment with dentist for reassessment of dentures - Reviewed return precautions

## 2018-12-26 NOTE — Patient Instructions (Addendum)
Thank you for coming in to see [place] today. Please see below to review our plan for today's visit.  1.  Contact your dentist to have your dentures evaluated.  These need to be continue to be cleaned regularly.  You may need your dentures to be refitted since you have not been seen in quite some time. 2.  For your breast pain, you can continue the Tylenol and Voltaren gel.  We will stop your tramadol use given the dangerous side effects at your age.  Continue using the strap and bring it to [facility] or other medical supply stores to find a replacement. 3.  We will see you again in 6 months or sooner if needed.  I would encourage you to set up a time to meet with me at some point with her daughter Carlyon Shadow discussed advanced directives.  Please call the clinic at [phone] if your symptoms worsen or you have any concerns. It was our pleasure to serve you.  [name], [place], PGY-3

## 2018-12-26 NOTE — Progress Notes (Signed)
   Subjective   Patient ID: [name]    DOB: 1924-08-04, 85 y.o. female   MRN: [id number]  CC: "Check-up"  HPI: [name] is a 85 y.o. female who presents to clinic today for the following:  Gum pain: Onset about 3 weeks ago with a soreness sensation on both the maxillary and mandibular region.  She has dentures which are regularly changed by her [name] who she lives with.  She has not seen a dentist in 15 years.  She denies any difficulty eating or drinking, pain in her jaw, headache, change in vision.  She is unsure when the last time she had her dentures checked.  Breast pain: This is a chronic issue for her and has been thoroughly investigated by multiple specialists.  She has a history of tramadol use with some improvement and is requesting refill today.  She has been using Tylenol and Voltaren gel regularly without improvement.  She does use an elastic band which she places around her upper chest which does give her improvement.  She denies chest pain or shortness of breath, syncope or fatigue.  ROS: see HPI for pertinent.  [place]: HTN, PAD, h/o vertebrobasilar artery syndrome, HFpEF, hiatal hernia, GERD, tobacco use disorder, h/o b/l-breast cancer, h/o colon cancer, severe protein-calorie malnutrition, gout, IDA.Surgical history tot knee, EGD for esophogeal tear, b/l mastectomy, cardiac cath 2007 severe 3-vessel disease. Family history heart disease. Smoking status reviewed. Medications reviewed.  Objective   BP 132/78   Pulse (!) 44   Temp 99.2 F (37.3 C) (Oral)   Ht 4\' 11"  (1.499 m)   Wt 109 lb 9.6 oz (49.7 kg)   SpO2 97%   BMI 22.14 kg/m  Vitals and nursing note reviewed.  General: frail elderly female in wheelchair, well nourished, well developed, NAD with non-toxic appearance HEENT: normocephalic, atraumatic, moist mucous membranes, clean dentures with healthy gumlines without tenderness on both the maxillary and mandibular surface, no appreciable abscess,  no tenderness to TMJ Neck: supple, non-tender without lymphadenopathy Cardiovascular: regular rate and rhythm without murmurs, rubs, or gallops Lungs: clear to auscultation bilaterally with normal work of breathing Skin: warm, dry, no rashes or lesions, cap refill < 2 seconds Extremities: warm and well perfused, normal tone, no edema  Assessment & Plan   Pain in gums Uncertain etiology.  Suspect dentures are likely playing a role.  No signs of abscess or skin breakdown on mucosal surfaces.  Does not seem to be involving TMJ. - Advised patient to set up appointment with dentist for reassessment of dentures - Reviewed return precautions  Chronic breast pain Consistent with chronic musculoskeletal pain related to her prior mastectomies.  Low concern for ACS or PE at this time.  No signs of malignancy recurrence. - Continue Tylenol, Voltaren gel, and elastic band around chest as needed - Discourage use of tramadol given age - Reviewed return precautions, RTC as needed  No orders of the defined types were placed in this encounter.  No orders of the defined types were placed in this encounter.   [name], [place], PGY-3 12/26/2018, 4:37 PM

## 2019-01-15 ENCOUNTER — Encounter: Payer: Self-pay | Admitting: Student in an Organized Health Care Education/Training Program

## 2019-01-15 ENCOUNTER — Ambulatory Visit (INDEPENDENT_AMBULATORY_CARE_PROVIDER_SITE_OTHER): Payer: Medicare Other | Admitting: Student in an Organized Health Care Education/Training Program

## 2019-01-15 DIAGNOSIS — R058 Other specified cough: Secondary | ICD-10-CM

## 2019-01-15 DIAGNOSIS — R05 Cough: Secondary | ICD-10-CM

## 2019-01-15 MED ORDER — FLUTICASONE PROPIONATE 50 MCG/ACT NA SUSP: 1.0000 | Freq: Every day | NASAL | 0 refills | Status: DC

## 2019-01-15 NOTE — Patient Instructions (Signed)
It was a pleasure seeing you today in our clinic. Here is the treatment plan we have discussed and agreed upon together:  Over the counter remedies for cold and congestion. You may use these as needed. - guaifenesin (Mucinex) - dextromethorphan (Delsym)   For allergies, I sent flonase to your pharmacy, this is a nasal spray. You may take your usual Claritin.   Our clinic's number is [phone]. Please call with questions or concerns about what we discussed today.  Be well, Dr. Burr Medico

## 2019-01-15 NOTE — Progress Notes (Signed)
Subjective:    [name] - 85 y.o. female MRN [id number]  Date of birth: 1924/10/09  HPI  [name] is a very pleasant 85 year old female who presents with her daughter today.  She reports 1 week of productive cough and congestion.  She has had no rhinorrhea.  No fevers dyspnea, no chest pain.  She has not been wheezing and has not required albuterol inhaler.  No nausea, vomiting, diarrhea or constipation.  She is not having any urinary symptoms.  She is staying hydrated and taking good p.o.  She is up-to-date on  influenza and pneumonia vaccines.   Health Maintenance:  Health Maintenance Due  Topic Date Due  . TETANUS/TDAP  04/13/2009  . OPHTHALMOLOGY EXAM  11/18/2015  . HEMOGLOBIN A1C  12/05/2018    -  reports that she has never smoked. Her smokeless tobacco use includes snuff. - Review of Systems: Per HPI. - Past Medical History: Patient Active Problem List   Diagnosis Date Noted  . Chronic breast pain 12/26/2018  . Pain in gums 12/26/2018  . History of deep vein thrombosis 10/17/2018  . Severe protein-calorie malnutrition Altamease Oiler: less than 60% of standard weight) ([place]) 10/17/2018  . Disorder associated with well-controlled type 2 diabetes mellitus ([place]) 10/17/2018  . History of pulmonary embolus (PE) 10/17/2018  . Hypomagnesemia 08/27/2018  . Left ventricular hypertrophy by electrocardiogram 08/27/2018  . Atypical chest pain 08/26/2018  . Advanced nonexudative age-related macular degeneration of both eyes without subfoveal involvement 07/30/2018  . Altered mental status 06/06/2018  . Microcytic anemia   . TIA (transient ischemic attack) 06/03/2018  . Mobility impaired 04/26/2018  . Protein-calorie malnutrition, severe 02/06/2017  . Urinary tract infection without hematuria 02/03/2017  . Hypokalemia 10/13/2016  . Pressure injury of heel, stage 2 ([place]) 08/31/2016  . Pulmonary embolus ([place]) 08/30/2016  . Dementia without behavioral disturbance ([place]) 06/01/2016    . Depression 01/20/2016  . Suicidal ideation 01/19/2016  . Prolapse of female pelvic organs 12/28/2015  . Leg weakness, bilateral 12/08/2015  . Weakness of right lower extremity 11/25/2015  . Dry eyes 10/28/2015  . (HFpEF) heart failure with preserved ejection fraction ([place])   . Gout 05/14/2015  . Delirium secondary to multiple medical problems 04/15/2015  . Primary hypertension   . Seasonal allergies 02/10/2015  . Stroke-like symptoms   . Stroke-like episode ([place]) 01/25/2015  . Syncope and collapse 01/25/2015  . Dizziness 09/19/2014  . Anemia of chronic illness 08/19/2014  . Acute superficial venous thrombosis of right lower extremity 08/19/2014  . Hypercalcemia 06/09/2014  . Breast cancer, left breast ([place]) 06/09/2014  . Breast cancer, right breast ([place]) 06/09/2014  . Edema of left lower extremity 03/30/2014  . PAD (peripheral artery disease) ([place]) 03/30/2014  . History of colon cancer 12/11/2013  . History of fall 05/15/2013  . Stricture and stenosis of esophagus 10/27/2012  . Thoracic back pain 05/24/2012  . Productive cough 04/30/2012  . Tobacco use disorder 06/02/2011  . Post-mastectomy pain 05/02/2011  . Hiatal hernia 03/22/2011  . 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 IDA (iron deficiency anemia) 04/29/2007  . Controlled type 2 diabetes mellitus with diabetic neuropathy, without long-term current use of insulin ([place]) 01/10/2007  . Coronary atherosclerosis 01/10/2007  . Asthma 01/10/2007  . Reflux esophagitis 01/10/2007  . Osteoarthritis, multiple sites 01/10/2007   - Medications: reviewed and updated Current Outpatient Medications  Medication Sig Dispense Refill  . acetaminophen (TYLENOL) 500 MG tablet Take 500 mg by mouth every 6 (six) hours as  needed for moderate pain.     Marland Kitchen albuterol (PROVENTIL HFA;VENTOLIN HFA) 108 (90 BASE) MCG/ACT inhaler Inhale 2 puffs into the lungs every 6 (six) hours as needed. For shortness of breath (Patient not taking: Reported  on 10/22/2018) 1 Inhaler 6  . Amino Acids-Protein Hydrolys (FEEDING SUPPLEMENT, PRO-STAT SUGAR FREE 64,) LIQD Take 30 mLs by mouth 2 (two) times daily with a meal. ensure    . amLODipine (NORVASC) 5 MG tablet Take 1 tablet (5 mg total) by mouth daily. 90 tablet 3  . aspirin EC 325 MG tablet Take 1 tablet (325 mg total) by mouth daily. 90 tablet 3  . capsaicin (ZOSTRIX) 0.025 % cream APPLY TO AFFECTED AREA TWICE A DAY (Patient not taking: Reported on 11/27/2018) 60 g 1  . carvedilol (COREG) 12.5 MG tablet TAKE 1 TABLET BY MOUTH TWICE A DAY WITH A MEAL (Patient taking differently: Take 12.5 mg by mouth 2 (two) times daily with a meal. ) 90 tablet 3  . cephALEXin (KEFLEX) 500 MG capsule Take 1 capsule (500 mg total) by mouth 4 (four) times daily. 28 capsule 0  . diclofenac sodium (VOLTAREN) 1 % GEL Apply 2 g topically 4 (four) times daily. (Patient not taking: Reported on 11/27/2018) 1 Tube 0  . fluticasone (FLONASE) 50 MCG/ACT nasal spray Place 1 spray into both nostrils daily. 1 g 0  . guaifenesin (ROBITUSSIN) 100 MG/5ML syrup Take 10 mLs (200 mg total) by mouth 3 (three) times daily as needed for cough. (Patient not taking: Reported on 11/27/2018) 120 mL 0  . hydroxypropyl methylcellulose / hypromellose (ISOPTO TEARS / GONIOVISC) 2.5 % ophthalmic solution Place 1 drop 3 (three) times daily into both eyes. (Patient not taking: Reported on 11/27/2018) 15 mL 12  . loratadine (CLARITIN) 10 MG tablet TAKE 1/2 TABLET BY MOUTH DAILY (Patient taking differently: Take 5 mg by mouth daily. ) 15 tablet 0  . losartan (COZAAR) 50 MG tablet Take 1 tablet (50 mg total) by mouth daily. 90 tablet 3  . pantoprazole (PROTONIX) 40 MG tablet TAKE 1 TABLET BY MOUTH EVERY DAY (Patient taking differently: Take 40 mg by mouth daily. ) 90 tablet 3  . PATADAY 0.2 % SOLN APPLY 1 DROP TO EYE DAILY. (Patient not taking: Reported on 11/27/2018) 2.5 mL 0  . phenazopyridine (PYRIDIUM) 200 MG tablet Take 1 tablet (200 mg total) by mouth 3  (three) times daily as needed for pain. (Patient not taking: Reported on 11/27/2018) 10 tablet 0  . ranitidine (ZANTAC) 150 MG tablet TAKE 1 TABLET BY MOUTH TWICE A DAY (Patient not taking: Reported on 11/27/2018) 180 tablet 1  . rosuvastatin (CRESTOR) 20 MG tablet TAKE 1 TABLET BY MOUTH EVERYDAY AT BEDTIME (Patient taking differently: Take 20 mg by mouth at bedtime. ) 90 tablet 2  . senna-docusate (SENOKOT-S) 8.6-50 MG tablet Take 2 tablets by mouth at bedtime. (Patient not taking: Reported on 11/27/2018) 60 tablet 3  . silver sulfADIAZINE (SILVADENE) 1 % cream Apply 1 application topically daily. (Patient not taking: Reported on 10/22/2018) 50 g 0  . traMADol (ULTRAM) 50 MG tablet Take 50 mg by mouth every 6 (six) hours as needed for moderate pain.   0   No current facility-administered medications for this visit.     Review of Systems See HPI     Objective:   Physical Exam BP (!) 156/64   Pulse 78   Temp 98.2 F (36.8 C) (Oral)   Ht 4\' 11"  (1.499 m)   Wt 111  lb 3.2 oz (50.4 kg)   SpO2 98%   BMI 22.46 kg/m  Gen: NAD, alert, cooperative with exam, well-appearing 85 year old female HEENT: NCAT, PERRL, clear conjunctiva, oropharynx clear, supple neck CV: RRR, good S1/S2, no murmur, no edema, capillary refill brisk  Resp: CTABL, no wheezes, non-labored, frequent coughing Abd: SNTND, BS present, no guarding or organomegaly Skin: no rashes, normal turgor  Neuro: no gross deficits.  Psych: good insight, alert and oriented    Assessment & Plan:   Productive cough May be allergies vs. Acute viral illness. Patient appears very well on exam. Lungs are clear. - continue claritin - recommend starting flonase - continue to stay hydrated - humidified air - delsym- guaifenesin PRN cough and congestion - given advanced age, prefer close follow up. patient to schedule follow up in one week, she should cancel >24h in advance if she is feeling much better. If symptoms persist could consider  CXR   Meds ordered this encounter  Medications  . fluticasone (FLONASE) 50 MCG/ACT nasal spray    Sig: Place 1 spray into both nostrils daily.    Dispense:  1 g    Refill:  0    Everrett Coombe, MD,MS,  PGY3 01/17/2019 10:51 PM

## 2019-01-16 ENCOUNTER — Ambulatory Visit: Payer: Medicare Other | Admitting: Podiatry

## 2019-01-16 ENCOUNTER — Ambulatory Visit: Payer: Medicare Other | Admitting: Family Medicine

## 2019-01-17 ENCOUNTER — Encounter: Payer: Self-pay | Admitting: Student in an Organized Health Care Education/Training Program

## 2019-01-17 NOTE — Assessment & Plan Note (Addendum)
May be allergies vs. Acute viral illness. Patient appears very well on exam. Lungs are clear. - continue claritin - recommend starting flonase - continue to stay hydrated - humidified air - delsym- guaifenesin PRN cough and congestion - given advanced age, prefer close follow up. patient to schedule follow up in one week, she should cancel >24h in advance if she is feeling much better. If symptoms persist could consider CXR

## 2019-01-18 IMAGING — DX DG CHEST 2V
2 series · 2 of 2 positions shown · non-contrast
Comparison: 08/26/2018

CLINICAL DATA: Shortness of breath and cough

EXAM:
CHEST - 2 VIEW

[chest lat]
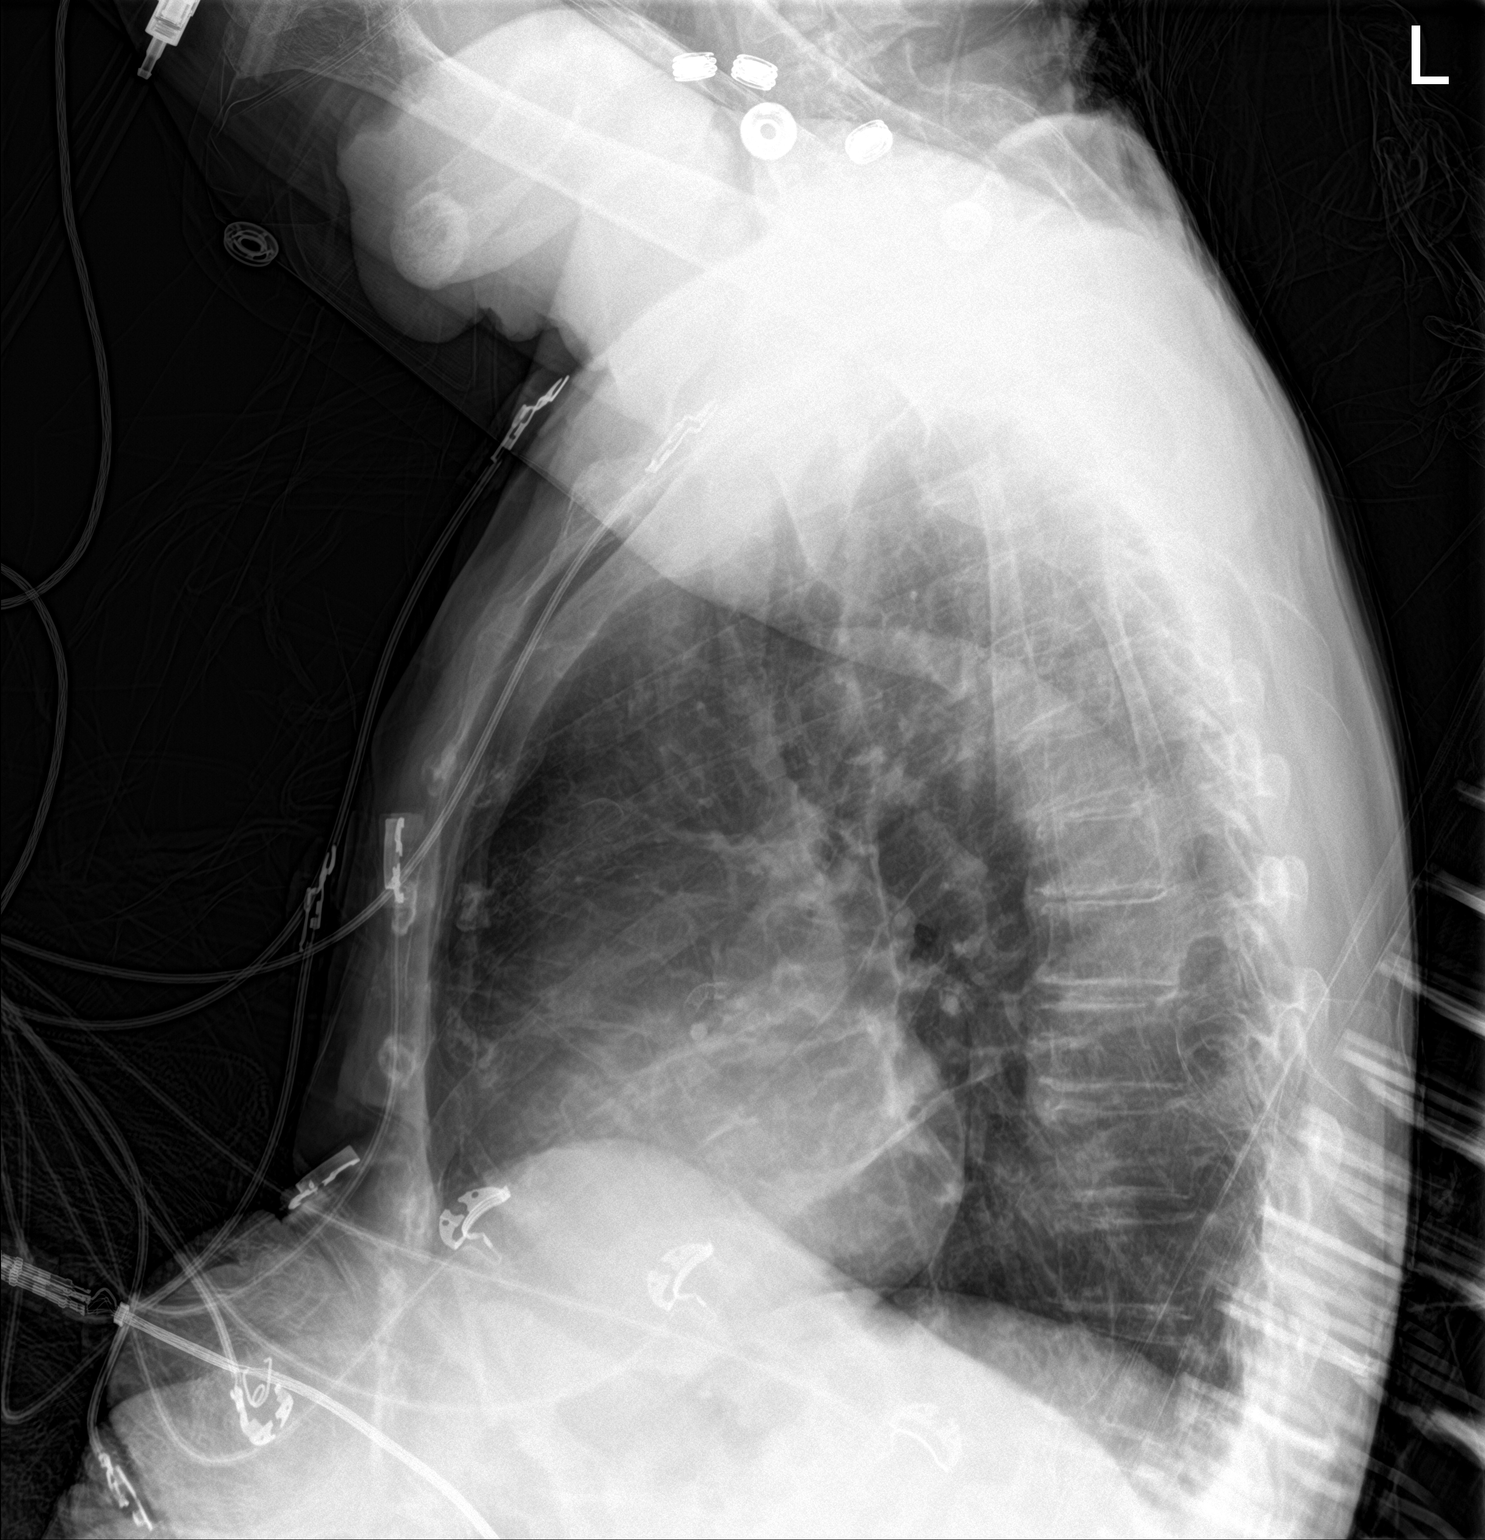

[chest ap]
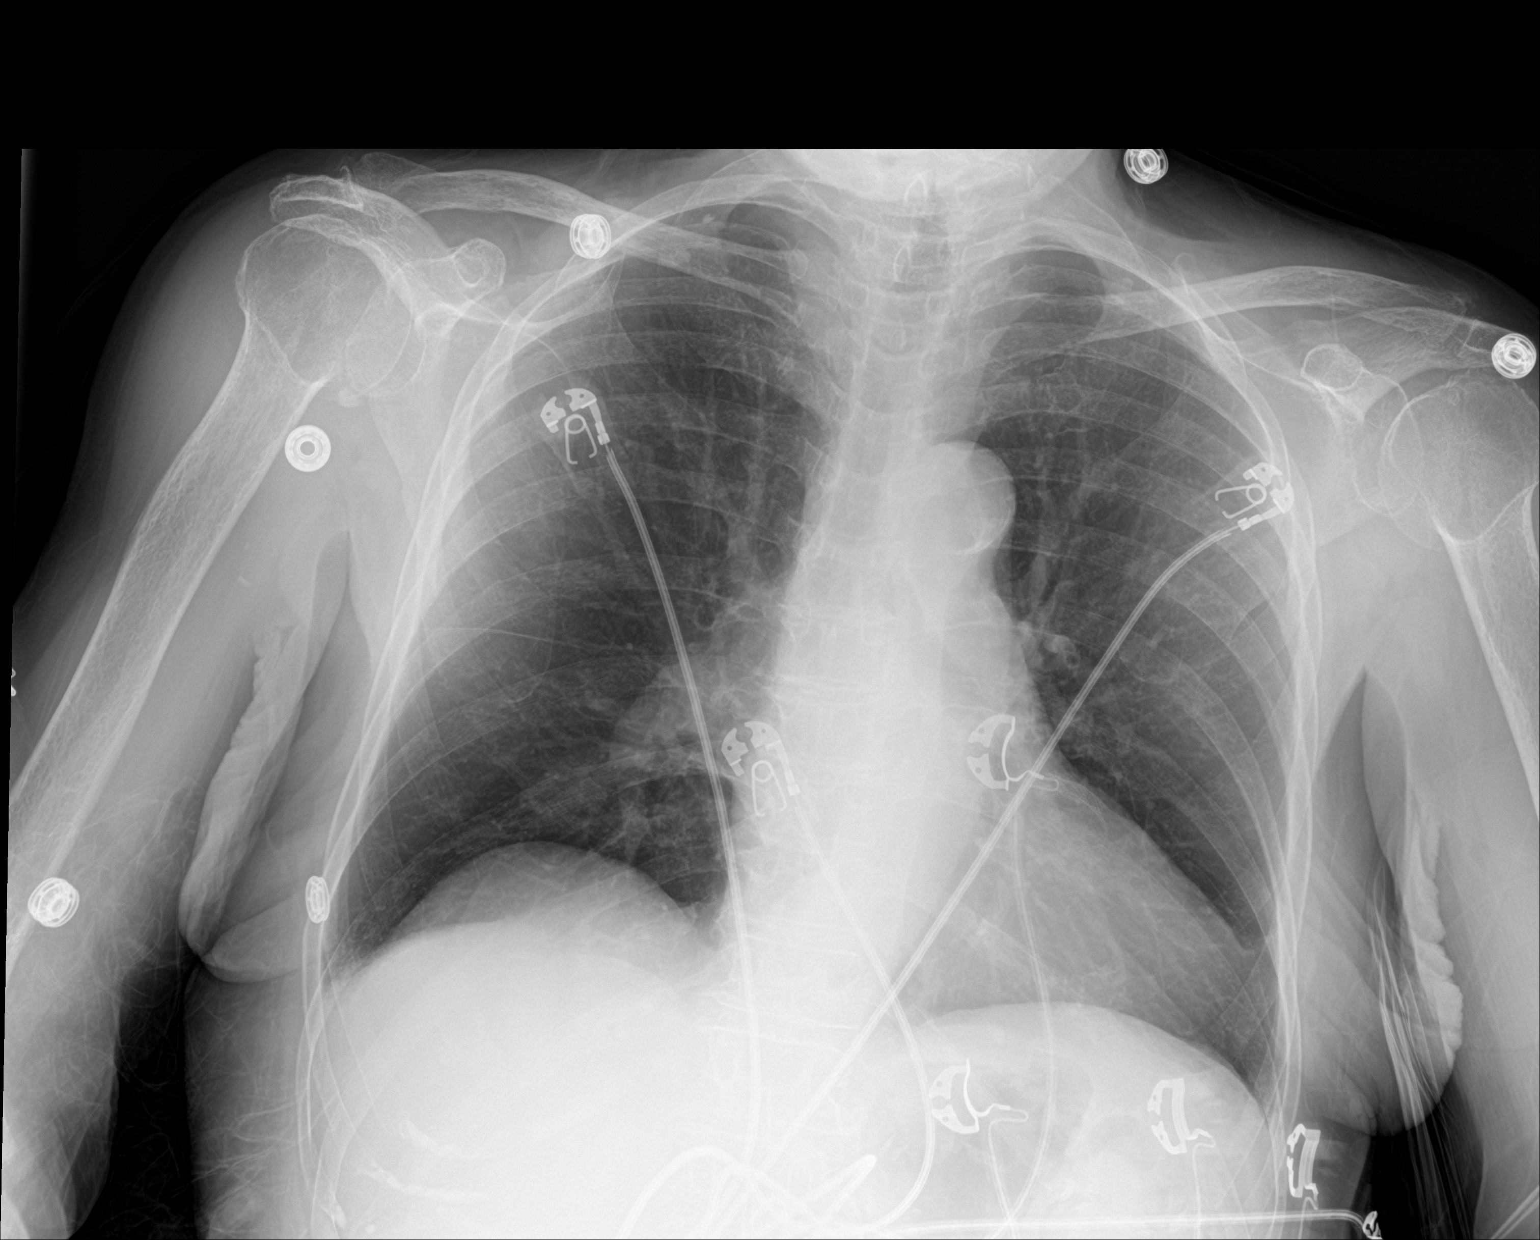

[2 of 2 positions shown; findings below may reference images not displayed]

FINDINGS: Cardiac shadow is stable. Aortic calcifications are again seen.
Lungs are well aerated bilaterally. No focal infiltrate or sizable
effusion is noted. No acute bony abnormality is noted. Degenerative
changes of thoracic spine seen.
IMPRESSION: No acute abnormality noted.

## 2019-01-21 ENCOUNTER — Ambulatory Visit (INDEPENDENT_AMBULATORY_CARE_PROVIDER_SITE_OTHER): Payer: Medicare Other | Admitting: Podiatry

## 2019-01-21 DIAGNOSIS — M79674 Pain in right toe(s): Secondary | ICD-10-CM

## 2019-01-21 DIAGNOSIS — M79675 Pain in left toe(s): Secondary | ICD-10-CM

## 2019-01-21 DIAGNOSIS — B351 Tinea unguium: Secondary | ICD-10-CM

## 2019-01-21 DIAGNOSIS — E1151 Type 2 diabetes mellitus with diabetic peripheral angiopathy without gangrene: Secondary | ICD-10-CM

## 2019-01-21 NOTE — Patient Instructions (Signed)
Diabetic Neuropathy Diabetic neuropathy refers to nerve damage that is caused by diabetes (diabetes mellitus). Over time, people with diabetes can develop nerve damage throughout the body. There are several types of diabetic neuropathy:  Peripheral neuropathy. This is the most common type of diabetic neuropathy. It causes damage to nerves that carry signals between the spinal cord and other parts of the body (peripheral nerves). This usually affects nerves in the feet and legs first, and may eventually affect the hands and arms. The damage affects the ability to sense touch or temperature.  Autonomic neuropathy. This type causes damage to nerves that control involuntary functions (autonomic nerves). These nerves carry signals that control: ? Heartbeat. ? Body temperature. ? Blood pressure. ? Urination. ? Digestion. ? Sweating. ? Sexual function. ? Response to changing blood sugar (glucose) levels.  Focal neuropathy. This type of nerve damage affects one area of the body, such as an arm, a leg, or the face. The injury may involve one nerve or a small group of nerves. Focal neuropathy can be painful and unpredictable, and occurs most often in older adults with diabetes. This often develops suddenly, but usually improves over time and does not cause long-term problems.  Proximal neuropathy. This type of nerve damage affects the nerves of the thighs, hips, buttocks, or legs. It causes severe pain, weakness, and muscle death (atrophy), usually in the thigh muscles. It is more common among older men and people who have type 2 diabetes. The length of recovery time may vary. What are the causes? Peripheral, autonomic, and focal neuropathies are caused by diabetes that is not well controlled with treatment. The cause of proximal neuropathy is not known, but it may be caused by inflammation related to uncontrolled blood glucose levels. What are the signs or symptoms? Peripheral neuropathy Peripheral  neuropathy develops slowly over time. When the nerves of the feet and legs no longer work, you may experience:  Burning, stabbing, or aching pain in the legs or feet.  Pain or cramping in the legs or feet.  Loss of feeling (numbness) and inability to feel pressure or pain in the feet. This can lead to: ? Thick calluses or sores on areas of constant pressure. ? Ulcers. ? Reduced ability to feel temperature changes.  Foot deformities.  Muscle weakness.  Loss of balance or coordination. Autonomic neuropathy The symptoms of autonomic neuropathy vary depending on which nerves are affected. Symptoms may include:  Problems with digestion, such as: ? Nausea or vomiting. ? Poor appetite. ? Bloating. ? Diarrhea or constipation. ? Trouble swallowing. ? Losing weight without trying to.  Problems with the heart, blood and lungs, such as: ? Dizziness, especially when standing up. ? Fainting. ? Shortness of breath. ? Irregular heartbeat.  Bladder problems, such as: ? Trouble starting or stopping urination. ? Leaking urine. ? Trouble emptying the bladder. ? Urinary tract infections (UTIs).  Problems with other body functions, such as: ? Sweat. You may sweat too much or too little. ? Temperature. You might get hot easily. Or, you might feel cold more than usual. ? Sexual function. Men may not be able to get or maintain an erection. Women may have vaginal dryness and difficulty with arousal. Focal neuropathy Symptoms affect only one area of the body. Common symptoms include:  Numbness.  Tingling.  Burning pain.  Prickling feeling.  Very sensitive skin.  Weakness.  Inability to move (paralysis).  Muscle twitching.  Muscles getting smaller (wasting).  Poor coordination.  Double or blurred vision. Proximal   neuropathy  Sudden, severe pain in the hip, thigh, or buttocks. Pain may spread from the back into the legs (sciatica).  Pain and numbness in the arms and  legs.  Tingling.  Loss of bladder control or bowel control.  Weakness and wasting of thigh muscles.  Difficulty getting up from a seated position.  Abdominal swelling.  Unexplained weight loss. How is this diagnosed? Diagnosis usually involves reviewing your medical history and any symptoms you have. Diagnosis varies depending on the type of neuropathy your health care provider suspects. Peripheral neuropathy Your health care provider will check areas that are affected by your nervous system (neurologic exam), such as your reflexes, how you move, and what you can feel. You may have other tests, such as:  Blood tests.  Removal and examination of fluid that surrounds the spinal cord (lumbar puncture).  CT scan.  MRI.  A test to check the nerves that control muscles (electromyogram, EMG).  Tests of how quickly messages pass through your nerves (nerve conduction velocity tests).  Removal of a small piece of nerve to be examined under a microscope (biopsy). Autonomic neuropathy You may have tests, such as:  Tests to measure your blood pressure and heart rate. This may include monitoring you while you are safely secured to an exam table that moves you from a lying position to an upright position (table tilt test).  Breathing tests to check your lungs.  Tests to check how food moves through the digestive system (gastric emptying tests).  Blood, sweat, or urine tests.  Ultrasound of your bladder.  Spinal fluid tests. Focal neuropathy This condition may be diagnosed with:  A neurologic exam.  CT scan.  MRI.  EMG.  Nerve conduction velocity tests. Proximal neuropathy There is no test to diagnose this type of neuropathy. You may have tests to rule out other possible causes of this type of neuropathy. Tests may include:  X-rays of your spine and lumbar region.  Lumbar puncture.  MRI. How is this treated? The goal of treatment is to keep nerve damage from getting  worse. The most important part of treatment is keeping your blood glucose level and your A1C level within your target range by following your diabetes management plan. Over time, maintaining lower blood glucose levels helps lessen symptoms. In some cases, you may need prescription pain medicine. Follow these instructions at home:  Lifestyle   Do not use any products that contain nicotine or tobacco, such as cigarettes and e-cigarettes. If you need help quitting, ask your health care provider.  Be physically active every day. Include strength training and balance exercises.  Follow a healthy meal plan.  Work with your health care provider to manage your blood pressure. General instructions  Follow your diabetes management plan as directed. ? Check your blood glucose levels as directed by your health care provider. ? Keep your blood glucose in your target range as directed by your health care provider. ? Have your A1C level checked at least two times a year, or as often as told by your health care provider.  Take over the counter and prescription medicines only as told by your health care provider. This includes insulin and diabetes medicine.  Do not drive or use heavy machinery while taking prescription pain medicines.  Check your skin and feet every day for cuts, bruises, redness, blisters, or sores.  Keep all follow up visits as told by your health care provider. This is important. Contact a health care provider if:  You have burning, stabbing, or aching pain in your legs or feet.  You are unable to feel pressure or pain in your feet.  You develop problems with digestion, such as: ? Nausea. ? Vomiting. ? Bloating. ? Constipation. ? Diarrhea. ? Abdominal pain.  You have difficulty with urination, such as inability: ? To control when you urinate (incontinence). ? To completely empty the bladder (retention).  You have palpitations.  You feel dizzy, weak, or faint when you  stand up. Get help right away if:  You cannot urinate.  You have sudden weakness or loss of coordination.  You have trouble speaking.  You have pain or pressure in your chest.  You have an irregular heart beat.  You have sudden inability to move a part of your body. Summary  Diabetic neuropathy refers to nerve damage that is caused by diabetes. It can affect nerves throughout the entire body, causing numbness and pain in the arms, legs, digestive tract, heart, and other body systems.  Keep your blood glucose level and your blood pressure in your target range, as directed by your health care provider. This can help prevent neuropathy from getting worse.  Check your skin and feet every day for cuts, bruises, redness, blisters, or sores.  Do not use any products that contain nicotine or tobacco, such as cigarettes and e-cigarettes. If you need help quitting, ask your health care provider. This information is not intended to replace advice given to you by your health care provider. Make sure you discuss any questions you have with your health care provider. Document Released: 01/08/2002 Document Revised: 12/12/2017 Document Reviewed: 12/04/2016 Elsevier Interactive Patient Education  2019 [employer]  Onychomycosis/Fungal Toenails  WHAT IS IT? An infection that lies within the keratin of your nail plate that is caused by a fungus.  WHY ME? Fungal infections affect all ages, sexes, races, and creeds.  There may be many factors that predispose you to a fungal infection such as age, coexisting medical conditions such as diabetes, or an autoimmune disease; stress, medications, fatigue, genetics, etc.  Bottom line: fungus thrives in a warm, moist environment and your shoes offer such a location.  IS IT CONTAGIOUS? Theoretically, yes.  You do not want to share shoes, nail clippers or files with someone who has fungal toenails.  Walking around barefoot in the same room or sleeping in the  same bed is unlikely to transfer the organism.  It is important to realize, however, that fungus can spread easily from one nail to the next on the same foot.  HOW DO WE TREAT THIS?  There are several ways to treat this condition.  Treatment may depend on many factors such as age, medications, pregnancy, liver and kidney conditions, etc.  It is best to ask your doctor which options are available to you.  1. No treatment.   Unlike many other medical concerns, you can live with this condition.  However for many people this can be a painful condition and may lead to ingrown toenails or a bacterial infection.  It is recommended that you keep the nails cut short to help reduce the amount of fungal nail. 2. Topical treatment.  These range from herbal remedies to prescription strength nail lacquers.  About 40-50% effective, topicals require twice daily application for approximately 9 to 12 months or until an entirely new nail has grown out.  The most effective topicals are medical grade medications available through physicians offices. 3. Oral antifungal medications.  With an 80-90%  cure rate, the most common oral medication requires 3 to 4 months of therapy and stays in your system for a year as the new nail grows out.  Oral antifungal medications do require blood work to make sure it is a safe drug for you.  A liver function panel will be performed prior to starting the medication and after the first month of treatment.  It is important to have the blood work performed to avoid any harmful side effects.  In general, this medication safe but blood work is required. 4. Laser Therapy.  This treatment is performed by applying a specialized laser to the affected nail plate.  This therapy is noninvasive, fast, and non-painful.  It is not covered by insurance and is therefore, out of pocket.  The results have been very good with a 80-95% cure rate.  [facility] is the only practice in the area to offer this  therapy. 5. Permanent Nail Avulsion.  Removing the entire nail so that a new nail will not grow back. 

## 2019-01-23 ENCOUNTER — Observation Stay (HOSPITAL_COMMUNITY)
Admission: EM | Admit: 2019-01-23 | Discharge: 2019-01-25 | Disposition: A | Discharge status: Home / Self Care | Payer: Medicare Other | Attending: Family Medicine | Admitting: Family Medicine

## 2019-01-23 ENCOUNTER — Encounter (HOSPITAL_COMMUNITY): Payer: Self-pay | Admitting: Emergency Medicine

## 2019-01-23 ENCOUNTER — Other Ambulatory Visit: Payer: Self-pay

## 2019-01-23 DIAGNOSIS — N200 Calculus of kidney: Secondary | ICD-10-CM | POA: Diagnosis not present

## 2019-01-23 DIAGNOSIS — E876 Hypokalemia: Principal | ICD-10-CM | POA: Insufficient documentation

## 2019-01-23 DIAGNOSIS — Z888 Allergy status to other drugs, medicaments and biological substances status: Secondary | ICD-10-CM | POA: Insufficient documentation

## 2019-01-23 DIAGNOSIS — I110 Hypertensive heart disease with heart failure: Secondary | ICD-10-CM | POA: Diagnosis not present

## 2019-01-23 DIAGNOSIS — D649 Anemia, unspecified: Secondary | ICD-10-CM | POA: Diagnosis not present

## 2019-01-23 DIAGNOSIS — Z791 Long term (current) use of non-steroidal anti-inflammatories (NSAID): Secondary | ICD-10-CM | POA: Insufficient documentation

## 2019-01-23 DIAGNOSIS — Z8673 Personal history of transient ischemic attack (TIA), and cerebral infarction without residual deficits: Secondary | ICD-10-CM | POA: Insufficient documentation

## 2019-01-23 DIAGNOSIS — E46 Unspecified protein-calorie malnutrition: Secondary | ICD-10-CM | POA: Insufficient documentation

## 2019-01-23 DIAGNOSIS — E119 Type 2 diabetes mellitus without complications: Secondary | ICD-10-CM | POA: Insufficient documentation

## 2019-01-23 DIAGNOSIS — Z79899 Other long term (current) drug therapy: Secondary | ICD-10-CM | POA: Diagnosis not present

## 2019-01-23 DIAGNOSIS — I509 Heart failure, unspecified: Secondary | ICD-10-CM | POA: Diagnosis not present

## 2019-01-23 DIAGNOSIS — K219 Gastro-esophageal reflux disease without esophagitis: Secondary | ICD-10-CM | POA: Insufficient documentation

## 2019-01-23 DIAGNOSIS — N390 Urinary tract infection, site not specified: Secondary | ICD-10-CM | POA: Diagnosis not present

## 2019-01-23 DIAGNOSIS — K5900 Constipation, unspecified: Secondary | ICD-10-CM | POA: Diagnosis not present

## 2019-01-23 DIAGNOSIS — I4891 Unspecified atrial fibrillation: Secondary | ICD-10-CM | POA: Diagnosis not present

## 2019-01-23 DIAGNOSIS — R109 Unspecified abdominal pain: Secondary | ICD-10-CM | POA: Diagnosis not present

## 2019-01-23 DIAGNOSIS — R531 Weakness: Secondary | ICD-10-CM | POA: Diagnosis not present

## 2019-01-23 DIAGNOSIS — I10 Essential (primary) hypertension: Secondary | ICD-10-CM | POA: Diagnosis not present

## 2019-01-23 DIAGNOSIS — R52 Pain, unspecified: Secondary | ICD-10-CM | POA: Diagnosis not present

## 2019-01-23 NOTE — ED Triage Notes (Signed)
BIB EMS from home. Pt called EMS for "not feeling well", reporting generalized weakness and bloated abd for about 1 week. Pt reports no BM since Sunday (3/8) and difficulty urinating since Tuesday (3/10). Hx MI, Afib. Edema noted to bilat LEs, but pt notes this to be baseline for her.

## 2019-01-24 ENCOUNTER — Encounter (HOSPITAL_COMMUNITY): Payer: Self-pay | Admitting: Emergency Medicine

## 2019-01-24 ENCOUNTER — Emergency Department (HOSPITAL_COMMUNITY): Payer: Medicare Other

## 2019-01-24 DIAGNOSIS — E876 Hypokalemia: Principal | ICD-10-CM

## 2019-01-24 DIAGNOSIS — N200 Calculus of kidney: Secondary | ICD-10-CM | POA: Diagnosis not present

## 2019-01-24 LAB — GLUCOSE, CAPILLARY
Glucose-Capillary: 118 mg/dL — ABNORMAL HIGH (ref 70–99)
Glucose-Capillary: 122 mg/dL — ABNORMAL HIGH (ref 70–99)
Glucose-Capillary: 191 mg/dL — ABNORMAL HIGH (ref 70–99)

## 2019-01-24 LAB — CBC WITH DIFFERENTIAL/PLATELET
Abs Immature Granulocytes: 0.01 10*3/uL (ref 0.00–0.07)
Basophils Absolute: 0 10*3/uL (ref 0.0–0.1)
Basophils Relative: 0 %
Eosinophils Absolute: 0.2 10*3/uL (ref 0.0–0.5)
Eosinophils Relative: 4 %
HCT: 34.1 % — ABNORMAL LOW (ref 36.0–46.0)
Hemoglobin: 10.2 g/dL — ABNORMAL LOW (ref 12.0–15.0)
Immature Granulocytes: 0 %
Lymphocytes Relative: 44 %
Lymphs Abs: 2 10*3/uL (ref 0.7–4.0)
MCH: 21.8 pg — AB (ref 26.0–34.0)
MCHC: 29.9 g/dL — ABNORMAL LOW (ref 30.0–36.0)
MCV: 73 fL — ABNORMAL LOW (ref 80.0–100.0)
MONO ABS: 0.4 10*3/uL (ref 0.1–1.0)
Monocytes Relative: 8 %
NEUTROS ABS: 2 10*3/uL (ref 1.7–7.7)
Neutrophils Relative %: 44 %
Platelets: 170 10*3/uL (ref 150–400)
RBC: 4.67 MIL/uL (ref 3.87–5.11)
RDW: 16.1 % — ABNORMAL HIGH (ref 11.5–15.5)
WBC: 4.6 10*3/uL (ref 4.0–10.5)
nRBC: 0 % (ref 0.0–0.2)

## 2019-01-24 LAB — COMPREHENSIVE METABOLIC PANEL
ALT: 9 U/L (ref 0–44)
AST: 13 U/L — ABNORMAL LOW (ref 15–41)
Albumin: 3 g/dL — ABNORMAL LOW (ref 3.5–5.0)
Alkaline Phosphatase: 60 U/L (ref 38–126)
Anion gap: 6 (ref 5–15)
BUN: 9 mg/dL (ref 8–23)
CO2: 24 mmol/L (ref 22–32)
CREATININE: 0.91 mg/dL (ref 0.44–1.00)
Calcium: 10.4 mg/dL — ABNORMAL HIGH (ref 8.9–10.3)
Chloride: 109 mmol/L (ref 98–111)
GFR, EST NON AFRICAN AMERICAN: 54 mL/min — AB (ref >60)
Glucose, Bld: 134 mg/dL — ABNORMAL HIGH (ref 70–99)
Potassium: 2.6 mmol/L — CL (ref 3.5–5.1)
Sodium: 139 mmol/L (ref 135–145)
Total Bilirubin: 0.6 mg/dL (ref 0.3–1.2)
Total Protein: 6.9 g/dL (ref 6.5–8.1)

## 2019-01-24 LAB — BASIC METABOLIC PANEL
Anion gap: 5 (ref 5–15)
Anion gap: 3 — ABNORMAL LOW (ref 5–15)
BUN: 9 mg/dL (ref 8–23)
BUN: 10 mg/dL (ref 8–23)
CO2: 25 mmol/L (ref 22–32)
CO2: 22 mmol/L (ref 22–32)
Calcium: 10.2 mg/dL (ref 8.9–10.3)
Calcium: 10.1 mg/dL (ref 8.9–10.3)
Chloride: 112 mmol/L — ABNORMAL HIGH (ref 98–111)
Chloride: 115 mmol/L — ABNORMAL HIGH (ref 98–111)
Creatinine, Ser: 0.91 mg/dL (ref 0.44–1.00)
Creatinine, Ser: 0.89 mg/dL (ref 0.44–1.00)
GFR calc Af Amer: >60 mL/min (ref >60)
GFR calc Af Amer: >60 mL/min (ref >60)
GFR calc non Af Amer: 54 mL/min — ABNORMAL LOW (ref >60)
GFR calc non Af Amer: 56 mL/min — ABNORMAL LOW (ref >60)
Glucose, Bld: 142 mg/dL — ABNORMAL HIGH (ref 70–99)
Glucose, Bld: 137 mg/dL — ABNORMAL HIGH (ref 70–99)
POTASSIUM: 3.6 mmol/L (ref 3.5–5.1)
Potassium: 3.8 mmol/L (ref 3.5–5.1)
Sodium: 142 mmol/L (ref 135–145)
Sodium: 140 mmol/L (ref 135–145)

## 2019-01-24 LAB — URINALYSIS, ROUTINE W REFLEX MICROSCOPIC
Bilirubin Urine: NEGATIVE
GLUCOSE, UA: NEGATIVE mg/dL
HGB URINE DIPSTICK: NEGATIVE
Ketones, ur: NEGATIVE mg/dL
NITRITE: NEGATIVE
Protein, ur: 100 mg/dL — AB
Specific Gravity, Urine: 1.008 (ref 1.005–1.030)
pH: 6 (ref 5.0–8.0)

## 2019-01-24 LAB — MAGNESIUM: Magnesium: 1.9 mg/dL (ref 1.7–2.4)

## 2019-01-24 LAB — I-STAT TROPONIN, ED: Troponin i, poc: 0.02 ng/mL (ref 0.00–0.08)

## 2019-01-24 MED ORDER — SODIUM CHLORIDE 0.9 % IV SOLN
INTRAVENOUS | Status: AC
  Administered 2019-01-24: 07:00:00 via INTRAVENOUS

## 2019-01-24 MED ORDER — ENOXAPARIN SODIUM 30 MG/0.3ML ~~LOC~~ SOLN
30.0000 mg | SUBCUTANEOUS | Status: DC
  Administered 2019-01-24 – 2019-01-25 (×2): 30 mg via SUBCUTANEOUS
  Filled 2019-01-24 (×2): qty 0.3

## 2019-01-24 MED ORDER — LOSARTAN POTASSIUM 50 MG PO TABS
50.0000 mg | ORAL_TABLET | Freq: Every day | ORAL | Status: DC
  Administered 2019-01-24: 50 mg via ORAL
  Filled 2019-01-24: qty 1

## 2019-01-24 MED ORDER — POTASSIUM CHLORIDE 10 MEQ/100ML IV SOLN
10.0000 meq | INTRAVENOUS | Status: DC
  Administered 2019-01-24 (×2): 10 meq via INTRAVENOUS
  Filled 2019-01-24 (×2): qty 100

## 2019-01-24 MED ORDER — SODIUM CHLORIDE 0.9 % IV SOLN
1.0000 g | Freq: Once | INTRAVENOUS | Status: AC
  Administered 2019-01-24: 1 g via INTRAVENOUS
  Filled 2019-01-24: qty 10

## 2019-01-24 MED ORDER — ASPIRIN 325 MG PO TABS
325.0000 mg | ORAL_TABLET | Freq: Every day | ORAL | Status: DC
  Administered 2019-01-24 – 2019-01-25 (×2): 325 mg via ORAL
  Filled 2019-01-24 (×2): qty 1

## 2019-01-24 MED ORDER — POTASSIUM CHLORIDE CRYS ER 20 MEQ PO TBCR
20.0000 meq | EXTENDED_RELEASE_TABLET | ORAL | Status: DC
  Administered 2019-01-24 (×2): 20 meq via ORAL
  Filled 2019-01-24 (×2): qty 1

## 2019-01-24 MED ORDER — LORATADINE 10 MG PO TABS
5.0000 mg | ORAL_TABLET | Freq: Every day | ORAL | Status: DC
  Administered 2019-01-24 – 2019-01-25 (×2): 5 mg via ORAL
  Filled 2019-01-24 (×2): qty 1

## 2019-01-24 MED ORDER — AMLODIPINE BESYLATE 5 MG PO TABS
5.0000 mg | ORAL_TABLET | Freq: Every day | ORAL | Status: DC
  Administered 2019-01-24 – 2019-01-25 (×2): 5 mg via ORAL
  Filled 2019-01-24: qty 1

## 2019-01-24 MED ORDER — PRO-STAT SUGAR FREE PO LIQD
30.0000 mL | Freq: Two times a day (BID) | ORAL | Status: DC
  Administered 2019-01-24 – 2019-01-25 (×3): 30 mL via ORAL
  Filled 2019-01-24 (×3): qty 30

## 2019-01-24 MED ORDER — SENNA 8.6 MG PO TABS
1.0000 | ORAL_TABLET | Freq: Two times a day (BID) | ORAL | Status: DC
  Administered 2019-01-24 – 2019-01-25 (×2): 8.6 mg via ORAL
  Filled 2019-01-24 (×2): qty 1

## 2019-01-24 MED ORDER — PANTOPRAZOLE SODIUM 40 MG PO TBEC
40.0000 mg | DELAYED_RELEASE_TABLET | Freq: Every day | ORAL | Status: DC
  Administered 2019-01-24 – 2019-01-25 (×2): 40 mg via ORAL
  Filled 2019-01-24 (×2): qty 1

## 2019-01-24 MED ORDER — ROSUVASTATIN CALCIUM 20 MG PO TABS
20.0000 mg | ORAL_TABLET | Freq: Every day | ORAL | Status: DC
  Administered 2019-01-24: 20 mg via ORAL
  Filled 2019-01-24: qty 1

## 2019-01-24 MED ORDER — CARVEDILOL 12.5 MG PO TABS
12.5000 mg | ORAL_TABLET | Freq: Two times a day (BID) | ORAL | Status: DC
  Administered 2019-01-24 – 2019-01-25 (×3): 12.5 mg via ORAL
  Filled 2019-01-24 (×3): qty 1

## 2019-01-24 MED ORDER — POLYETHYLENE GLYCOL 3350 17 G PO PACK
17.0000 g | PACK | Freq: Every day | ORAL | Status: DC
  Administered 2019-01-24 – 2019-01-25 (×2): 17 g via ORAL
  Filled 2019-01-24 (×2): qty 1

## 2019-01-24 MED ORDER — FLUTICASONE PROPIONATE 50 MCG/ACT NA SUSP
1.0000 | Freq: Every day | NASAL | Status: DC
  Administered 2019-01-24: 1 via NASAL
  Filled 2019-01-24: qty 16

## 2019-01-24 MED ORDER — POTASSIUM CHLORIDE CRYS ER 20 MEQ PO TBCR
20.0000 meq | EXTENDED_RELEASE_TABLET | ORAL | Status: AC
  Administered 2019-01-24 (×2): 20 meq via ORAL
  Filled 2019-01-24 (×2): qty 1

## 2019-01-24 NOTE — Progress Notes (Signed)
FPTS Interim Progress Note  S:patient feels ok today.  She states her arm 'burns' where her IV is placed. Patient taking lisinopril 100mg  am and 50mg  qhs   O: BP (!) 155/45 (BP Location: Left Arm)   Pulse 64   Temp 98.2 F (36.8 C) (Oral)   Resp 18   Ht 4\' 11"  (1.499 m)   Wt 50.4 kg   SpO2 99%   BMI 22.46 kg/m     A/P: Hypokalemia - pt given 20 mEq KDur at 8 and 9am.  Also started IV 48mEq K but stopped after 2 doses due to pain in her arm.  Gave an additional 20 mEq PO at 1030 and 1230pm.  Checked BMP at 1230.  - stop lisinopril   Benay Pike, MD 01/24/2019, 1:55 PM PGY-1, [place] [facility] pager [phone]

## 2019-01-24 NOTE — ED Provider Notes (Signed)
[facility] EMERGENCY DEPARTMENT Provider Note   CSN: [id number] Arrival date & time: 01/23/19  2317    History   Chief Complaint Chief Complaint  Patient presents with  . Weakness  . Abdominal Pain    HPI [name] is a 85 y.o. female.     The history is provided by the patient and the EMS personnel.  Weakness  Severity:  Moderate Onset quality:  Gradual Timing:  Constant Progression:  Worsening Chronicity:  New Context: not pinched nerve   Context comment:  Difficulty urinating  and constipation Relieved by:  Nothing Worsened by:  Nothing Ineffective treatments:  None tried Associated symptoms: no chest pain, no cough, no diarrhea, no falls, no fever, no nausea, no shortness of breath and no vomiting   Associated symptoms comment:  Constipation Risk factors: no recent stressors   Patient with complex PMH presents with global weakness, constipation and difficulty urinating for several days.  She feels bloated and was given a stool softener at home without relief.    Past Medical History:  Diagnosis Date  . Allergy   . Anemia    Iron def anemia  . Arthritis   . Asthma   . Breast cancer ([place]) 10/2010   Invasive Ductal Carcinoma, s/p bilateral mastectomy, now on Tamoxifen. Followed by Dr Jamse Arn.   . Breast cancer ([place]) 2011   s/p Bilateral masectomy  . CAD (coronary artery disease)   . Carotid artery aneurysm ([place]) 08/2010   right ICA, 5 x 57mm  . CHF (congestive heart failure) (HCC)    Systolic   . Colon cancer ([place])   . Colon cancer ([place]) 12/11/2013  . DDD (degenerative disc disease), cervical   . Diabetes mellitus   . Diverticulosis   . GERD (gastroesophageal reflux disease)   . H/O: GI bleed   . Hiatal hernia   . History of colon cancer   . Hypercalcemia 06/09/2014  . Hyperlipidemia   . Hypertension   . Myocardial infarction ([place])   . Neuropathy   . Pneumonia 03/2012  . Pressure injury of heel, stage 2 ([place]) 08/31/2016  .  Shortness of breath     Patient Active Problem List   Diagnosis Date Noted  . Chronic breast pain 12/26/2018  . Pain in gums 12/26/2018  . History of deep vein thrombosis 10/17/2018  . Severe protein-calorie malnutrition Altamease Oiler: less than 60% of standard weight) ([place]) 10/17/2018  . Disorder associated with well-controlled type 2 diabetes mellitus ([place]) 10/17/2018  . History of pulmonary embolus (PE) 10/17/2018  . Hypomagnesemia 08/27/2018  . Left ventricular hypertrophy by electrocardiogram 08/27/2018  . Atypical chest pain 08/26/2018  . Advanced nonexudative age-related macular degeneration of both eyes without subfoveal involvement 07/30/2018  . Altered mental status 06/06/2018  . Microcytic anemia   . TIA (transient ischemic attack) 06/03/2018  . Mobility impaired 04/26/2018  . Protein-calorie malnutrition, severe 02/06/2017  . Urinary tract infection without hematuria 02/03/2017  . Hypokalemia 10/13/2016  . Pressure injury of heel, stage 2 ([place]) 08/31/2016  . Pulmonary embolus ([place]) 08/30/2016  . Dementia without behavioral disturbance ([place]) 06/01/2016  . Depression 01/20/2016  . Suicidal ideation 01/19/2016  . Prolapse of female pelvic organs 12/28/2015  . Leg weakness, bilateral 12/08/2015  . Weakness of right lower extremity 11/25/2015  . Dry eyes 10/28/2015  . (HFpEF) heart failure with preserved ejection fraction ([place])   . Gout 05/14/2015  . Delirium secondary to multiple medical problems 04/15/2015  . Primary hypertension   .  Seasonal allergies 02/10/2015  . Stroke-like symptoms   . Stroke-like episode ([place]) 01/25/2015  . Syncope and collapse 01/25/2015  . Dizziness 09/19/2014  . Anemia of chronic illness 08/19/2014  . Acute superficial venous thrombosis of right lower extremity 08/19/2014  . Hypercalcemia 06/09/2014  . Breast cancer, left breast ([place]) 06/09/2014  . Breast cancer, right breast ([place]) 06/09/2014  . Edema of left lower extremity 03/30/2014  . PAD  (peripheral artery disease) ([place]) 03/30/2014  . History of colon cancer 12/11/2013  . History of fall 05/15/2013  . Stricture and stenosis of esophagus 10/27/2012  . Thoracic back pain 05/24/2012  . Productive cough 04/30/2012  . Tobacco use disorder 06/02/2011  . Post-mastectomy pain 05/02/2011  . Hiatal hernia 03/22/2011  . 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 IDA (iron deficiency anemia) 04/29/2007  . Controlled type 2 diabetes mellitus with diabetic neuropathy, without long-term current use of insulin ([place]) 01/10/2007  . Coronary atherosclerosis 01/10/2007  . Asthma 01/10/2007  . Reflux esophagitis 01/10/2007  . Osteoarthritis, multiple sites 01/10/2007    Past Surgical History:  Procedure Laterality Date  . Breast masectomy  10/2010   Bilateral masectomy by Dr Lucia Gaskins s/p invasive ductal carcinoma.  Marland Kitchen BREAST SURGERY    . Cardiac  Catherization  2007   Severe 3-vessel disease.  EF 20-25%.  . ESOPHAGOGASTRODUODENOSCOPY  2001   Esophageal Tear  . ESOPHAGOGASTRODUODENOSCOPY  10/27/2012   Procedure: ESOPHAGOGASTRODUODENOSCOPY (EGD);  Surgeon: Irene Shipper, MD;  Location: [facility] END[facility]COPY;  Service: Endoscopy;  Laterality: N/A;  pat  . JOINT REPLACEMENT Right 2001   Knee  . TOTAL KNEE ARTHROPLASTY  2001     OB History   No obstetric history on file.      Home Medications    Prior to Admission medications   Medication Sig Start Date End Date Taking? Authorizing Provider  acetaminophen (TYLENOL) 500 MG tablet Take 500 mg by mouth every 6 (six) hours as needed for moderate pain.     [provider]  albuterol (PROVENTIL HFA;VENTOLIN HFA) 108 (90 BASE) MCG/ACT inhaler Inhale 2 puffs into the lungs every 6 (six) hours as needed. For shortness of breath Patient not taking: Reported on 10/22/2018 01/18/15   [name], MD  Amino Acids-Protein Hydrolys (FEEDING SUPPLEMENT, PRO-STAT SUGAR FREE 64,) LIQD Take 30 mLs by mouth 2 (two) times daily with a meal.  ensure    [provider]  amLODipine (NORVASC) 5 MG tablet Take 1 tablet (5 mg total) by mouth daily. 07/23/18   [place] Bing, DO  aspirin 325 MG EC tablet TAKE 1 TABLET BY MOUTH EVERY DAY 10/01/18   [provider]  aspirin EC 325 MG tablet Take 1 tablet (325 mg total) by mouth daily. 07/05/18   [place] Bing, DO  capsaicin (ZOSTRIX) 0.025 % cream APPLY TO AFFECTED AREA TWICE A DAY Patient not taking: Reported on 11/27/2018 04/11/16   [name], MD  carvedilol (COREG) 12.5 MG tablet TAKE 1 TABLET BY MOUTH TWICE A DAY WITH A MEAL Patient taking differently: Take 12.5 mg by mouth 2 (two) times daily with a meal.  07/23/18   [place] Bing, DO  cephALEXin (KEFLEX) 500 MG capsule Take 1 capsule (500 mg total) by mouth 4 (four) times daily. 11/27/18   Isla Pence, MD  diclofenac sodium (VOLTAREN) 1 % GEL Apply 2 g topically 4 (four) times daily. Patient not taking: Reported on 11/27/2018 08/29/18   Meccariello, Bernita Raisin, DO  fluticasone (FLONASE) 50 MCG/ACT  nasal spray Place 1 spray into both nostrils daily. 01/15/19 01/15/20  Everrett Coombe, MD  guaifenesin (ROBITUSSIN) 100 MG/5ML syrup Take 10 mLs (200 mg total) by mouth 3 (three) times daily as needed for cough. Patient not taking: Reported on 11/27/2018 08/15/16   [name], MD  hydroxypropyl methylcellulose / hypromellose (ISOPTO TEARS / GONIOVISC) 2.5 % ophthalmic solution Place 1 drop 3 (three) times daily into both eyes. Patient not taking: Reported on 11/27/2018 09/28/17   [place] Bing, DO  loratadine (CLARITIN) 10 MG tablet TAKE 1/2 TABLET BY MOUTH DAILY Patient taking differently: Take 5 mg by mouth daily.  12/08/15   [name], MD  losartan (COZAAR) 50 MG tablet Take 1 tablet (50 mg total) by mouth daily. 07/23/18   [place] Bing, DO  pantoprazole (PROTONIX) 40 MG tablet TAKE 1 TABLET BY MOUTH EVERY DAY Patient taking differently: Take 40 mg by mouth daily.  10/14/18   [place] Bing, DO   PATADAY 0.2 % SOLN APPLY 1 DROP TO EYE DAILY. Patient not taking: Reported on 11/27/2018 11/23/15   [name], MD  phenazopyridine (PYRIDIUM) 200 MG tablet Take 1 tablet (200 mg total) by mouth 3 (three) times daily as needed for pain. Patient not taking: Reported on 11/27/2018 08/08/18   [place] Bing, DO  ranitidine (ZANTAC) 150 MG tablet TAKE 1 TABLET BY MOUTH TWICE A DAY Patient not taking: Reported on 11/27/2018 07/29/18   [place] Bing, DO  rosuvastatin (CRESTOR) 20 MG tablet TAKE 1 TABLET BY MOUTH EVERYDAY AT BEDTIME Patient taking differently: Take 20 mg by mouth at bedtime.  09/09/18   [place] Bing, DO  senna-docusate (SENOKOT-S) 8.6-50 MG tablet Take 2 tablets by mouth at bedtime. Patient not taking: Reported on 11/27/2018 04/23/17   [place] Crews, MD  silver sulfADIAZINE (SILVADENE) 1 % cream Apply 1 application topically daily. Patient not taking: Reported on 10/22/2018 05/15/17   Gean Birchwood, DPM  traMADol (ULTRAM) 50 MG tablet Take 50 mg by mouth every 6 (six) hours as needed for moderate pain.  10/05/18   [provider]  cetirizine (ZYRTEC) 5 MG tablet Take 1 tablet (5 mg total) by mouth daily. Take at night time as it can cause some drowsiness 05/24/12 07/09/12  [place], Burnell Blanks, MD    Family History Family History  Problem Relation Age of Onset  . Sickle cell anemia Other   . Heart disease Mother   . Heart disease Father   . Cancer Daughter 29       breast ca  . Hypertension Daughter   . Cancer Daughter 69       breast ca  . Hypertension Daughter   . Heart disease Son        Poor circulation-Left Leg  . Diabetes Daughter   . Hypertension Daughter   . Hyperlipidemia Daughter        Poor circulation- Toe amputation    Social History Social History   Tobacco Use  . Smoking status: Never Smoker  . Smokeless tobacco: Current User    Types: Snuff  Substance Use Topics  . Alcohol use: No  . Drug use: No     Allergies    Tape and Lisinopril   Review of Systems Review of Systems  Constitutional: Negative for fever.  Respiratory: Negative for cough and shortness of breath.   Cardiovascular: Negative for chest pain.  Gastrointestinal: Positive for constipation. Negative for diarrhea, nausea, rectal pain and vomiting.  Genitourinary: Positive for difficulty urinating.  Musculoskeletal: Negative for falls.  Neurological: Negative for speech difficulty, weakness and numbness.  All other systems reviewed and are negative.    Physical Exam Updated Vital Signs BP (!) 138/46   Pulse 75   Temp 98.1 F (36.7 C) (Oral)   Resp 19   Ht 4\' 11"  (1.499 m)   Wt 50.4 kg   SpO2 98%   BMI 22.46 kg/m   Physical Exam Vitals signs and nursing note reviewed.  Constitutional:      General: She is not in acute distress.    Appearance: She is normal weight.  HENT:     Head: Normocephalic and atraumatic.     Nose: Nose normal.     Mouth/Throat:     Mouth: Mucous membranes are moist.     Pharynx: Oropharynx is clear.  Eyes:     Conjunctiva/sclera: Conjunctivae normal.     Pupils: Pupils are equal, round, and reactive to light.  Neck:     Musculoskeletal: Normal range of motion and neck supple.  Cardiovascular:     Rate and Rhythm: Normal rate and regular rhythm.     Pulses: Normal pulses.     Heart sounds: Normal heart sounds.  Pulmonary:     Effort: Pulmonary effort is normal.     Breath sounds: Normal breath sounds.  Abdominal:     General: Abdomen is flat. Bowel sounds are normal.     Tenderness: There is no abdominal tenderness. There is no guarding.  Musculoskeletal: Normal range of motion.  Skin:    General: Skin is warm and dry.     Capillary Refill: Capillary refill takes less than 2 seconds.  Neurological:     General: No focal deficit present.     Mental Status: She is alert and oriented to person, place, and time.  Psychiatric:        Mood and Affect: Mood normal.        Behavior:  Behavior normal.      ED Treatments / Results  Labs (all labs ordered are listed, but only abnormal results are displayed) Results for orders placed or performed during the hospital encounter of 01/23/19  CBC with Differential/Platelet  Result Value Ref Range   WBC 4.6 4.0 - 10.5 K/uL   RBC 4.67 3.87 - 5.11 MIL/uL   Hemoglobin 10.2 (L) 12.0 - 15.0 g/dL   HCT 34.1 (L) 36.0 - 46.0 %   MCV 73.0 (L) 80.0 - 100.0 fL   MCH 21.8 (L) 26.0 - 34.0 pg   MCHC 29.9 (L) 30.0 - 36.0 g/dL   RDW 16.1 (H) 11.5 - 15.5 %   Platelets 170 150 - 400 K/uL   nRBC 0.0 0.0 - 0.2 %   Neutrophils Relative % 44 %   Neutro Abs 2.0 1.7 - 7.7 K/uL   Lymphocytes Relative 44 %   Lymphs Abs 2.0 0.7 - 4.0 K/uL   Monocytes Relative 8 %   Monocytes Absolute 0.4 0.1 - 1.0 K/uL   Eosinophils Relative 4 %   Eosinophils Absolute 0.2 0.0 - 0.5 K/uL   Basophils Relative 0 %   Basophils Absolute 0.0 0.0 - 0.1 K/uL   Immature Granulocytes 0 %   Abs Immature Granulocytes 0.01 0.00 - 0.07 K/uL  Comprehensive metabolic panel  Result Value Ref Range   Sodium 139 135 - 145 mmol/L   Potassium 2.6 (LL) 3.5 - 5.1 mmol/L   Chloride 109 98 - 111 mmol/L   CO2  24 22 - 32 mmol/L   Glucose, Bld 134 (H) 70 - 99 mg/dL   BUN 9 8 - 23 mg/dL   Creatinine, Ser 0.91 0.44 - 1.00 mg/dL   Calcium 10.4 (H) 8.9 - 10.3 mg/dL   Total Protein 6.9 6.5 - 8.1 g/dL   Albumin 3.0 (L) 3.5 - 5.0 g/dL   AST 13 (L) 15 - 41 U/L   ALT 9 0 - 44 U/L   Alkaline Phosphatase 60 38 - 126 U/L   Total Bilirubin 0.6 0.3 - 1.2 mg/dL   GFR calc non Af Amer 54 (L) >60 mL/min   GFR calc Af Amer >60 >60 mL/min   Anion gap 6 5 - 15  Urinalysis, Routine w reflex microscopic  Result Value Ref Range   Color, Urine YELLOW YELLOW   APPearance HAZY (A) CLEAR   Specific Gravity, Urine 1.008 1.005 - 1.030   pH 6.0 5.0 - 8.0   Glucose, UA NEGATIVE NEGATIVE mg/dL   Hgb urine dipstick NEGATIVE NEGATIVE   Bilirubin Urine NEGATIVE NEGATIVE   Ketones, ur NEGATIVE  NEGATIVE mg/dL   Protein, ur 100 (A) NEGATIVE mg/dL   Nitrite NEGATIVE NEGATIVE   Leukocytes,Ua MODERATE (A) NEGATIVE   RBC / HPF 0-5 0 - 5 RBC/hpf   WBC, UA 6-10 0 - 5 WBC/hpf   Bacteria, UA MANY (A) NONE SEEN   Squamous Epithelial / LPF 0-5 0 - 5   Mucus PRESENT   I-stat troponin, ED  Result Value Ref Range   Troponin i, poc 0.02 0.00 - 0.08 ng/mL   Comment 3           *Note: Due to a large number of results and/or encounters for the requested time period, some results have not been displayed. A complete set of results can be found in Results Review.   Dg Abd Acute 2+v W 1v Chest  Result Date: 01/24/2019 CLINICAL DATA:  Generalized weakness and bloating EXAM: DG ABDOMEN ACUTE W/ 1V CHEST COMPARISON:  11/27/2018 CXR, chest CT 08/30/2016 FINDINGS: No acute pulmonary consolidation. Top-normal heart size with slight uncoiling of the atherosclerotic aorta. Moderate chronic right hilar prominence with pulmonary hyperinflation likely reflects chronic pulmonary hypertension. Nonobstructed bowel gas pattern. No free air, organomegaly nor suspicious radiopaque calculi. Phleboliths are noted in pelvis. Thoracolumbar spondylosis is noted with osteoarthritis of the hips. No acute nor aggressive osseous lesions. Aortoiliac atherosclerosis. Chain sutures are present in the mid abdomen. A 2 mm calcification the right upper quadrant may reflect a renal stone, gallstone or potentially vascular calcification among some considerations. IMPRESSION: 1. Nonobstructed bowel gas pattern. 2. Mild pulmonary hyperinflation with pulmonary vascular prominence likely reflecting chronic pulmonary hypertension. 3. 2 mm calcification in the right upper quadrant question renal stone versus gallstone versus vascular calcification. 4. Thoracolumbar spondylosis. Electronically Signed   By: Ashley Royalty M.D.   On: 01/24/2019 00:49    EKG  EKG Interpretation  Date/Time:  Friday January 24 2019 00:20:30 EDT Ventricular Rate:  75 PR  Interval:    QRS Duration: 84 QT Interval:  406 QTC Calculation: 034 R Axis:   -15 Text Interpretation:  Sinus rhythm Consider left ventricular hypertrophy Confirmed by Dory Horn) on 01/24/2019 4:02:27 AM       Radiology Dg Abd Acute 2+v W 1v Chest  Result Date: 01/24/2019 CLINICAL DATA:  Generalized weakness and bloating EXAM: DG ABDOMEN ACUTE W/ 1V CHEST COMPARISON:  11/27/2018 CXR, chest CT 08/30/2016 FINDINGS: No acute pulmonary consolidation. Top-normal heart size with  slight uncoiling of the atherosclerotic aorta. Moderate chronic right hilar prominence with pulmonary hyperinflation likely reflects chronic pulmonary hypertension. Nonobstructed bowel gas pattern. No free air, organomegaly nor suspicious radiopaque calculi. Phleboliths are noted in pelvis. Thoracolumbar spondylosis is noted with osteoarthritis of the hips. No acute nor aggressive osseous lesions. Aortoiliac atherosclerosis. Chain sutures are present in the mid abdomen. A 2 mm calcification the right upper quadrant may reflect a renal stone, gallstone or potentially vascular calcification among some considerations. IMPRESSION: 1. Nonobstructed bowel gas pattern. 2. Mild pulmonary hyperinflation with pulmonary vascular prominence likely reflecting chronic pulmonary hypertension. 3. 2 mm calcification in the right upper quadrant question renal stone versus gallstone versus vascular calcification. 4. Thoracolumbar spondylosis. Electronically Signed   By: Ashley Royalty M.D.   On: 01/24/2019 00:49    Procedures Procedures (including critical care time)  Medications Ordered in ED Medications  cefTRIAXone (ROCEPHIN) 1 g in sodium chloride 0.9 % 100 mL IVPB (has no administration in time range)  potassium chloride 10 mEq in 100 mL IVPB (has no administration in time range)   MDM Reviewed: previous chart, nursing note and vitals Interpretation: labs, ECG and x-ray (no PNA on CXR hypokalemia and UTI) Consults: admitting  MD    Final Clinical Impressions(s) / ED Diagnoses   Final diagnoses:  Lower urinary tract infectious disease  Hypokalemia    Admit for UTI and hypokalemia   Lory Nowaczyk, MD 01/24/19 1610

## 2019-01-24 NOTE — ED Notes (Signed)
IV Team at bedside 

## 2019-01-24 NOTE — TOC Initial Note (Signed)
Transition of Care [facility]) - Initial/Assessment Note    Patient Details  Name: [name] MRN: [id number] Date of Birth: 1924-08-08  [facility] CM/SW Contact:    Bartholomew Crews, RN Phone Number: 01/24/2019, 3:58 PM  Clinical Narrative:                 Spoke with patient at bedside. Patient states that she lives at home with her daughter. PCP-McMullen, Grayling Congress, DO. States her daughters rotate taking her to the doctor. Gets prescriptions filled at [facility]. Reports that she has a caregiver who comes in to assist, and that she has a walker, wheelchair, and bedside commode. CM to follow for transition of care needs.   Expected Discharge Plan: Home/Self Care Barriers to Discharge: No Barriers Identified   Patient Goals and CMS Choice Patient states their goals for this hospitalization and ongoing recovery are:: return home   Choice offered to / list presented to : NA  Expected Discharge Plan and Services Expected Discharge Plan: Home/Self Care Discharge Planning Services: CM Consult Post Acute Care Choice: NA Living arrangements for the past 2 months: Single Family Home                 DME Arranged: N/A DME Agency: NA HH Arranged: NA HH Agency: NA  Prior Living Arrangements/Services Living arrangements for the past 2 months: Single Family Home Lives with:: Adult Children Patient language and need for interpreter reviewed:: No Do you feel safe going back to the place where you live?: Yes      Need for Family Participation in Patient Care: Yes (Comment) Care giver support system in place?: Yes (comment) Current home services: DME, Other (comment)(personal caregiver) Criminal Activity/Legal Involvement Pertinent to Current Situation/Hospitalization: No - Comment as needed  Activities of Daily Living Home Assistive Devices/Equipment: None ADL Screening (condition at time of admission) Patient's cognitive ability adequate to safely complete daily  activities?: No Is the patient deaf or have difficulty hearing?: No Does the patient have difficulty seeing, even when wearing glasses/contacts?: No Does the patient have difficulty concentrating, remembering, or making decisions?: No Patient able to express need for assistance with ADLs?: Yes Does the patient have difficulty dressing or bathing?: Yes Independently performs ADLs?: Yes (appropriate for developmental age) Does the patient have difficulty walking or climbing stairs?: Yes Weakness of Legs: None Weakness of Arms/Hands: None  Permission Sought/Granted Permission sought to share information with : Family Supports Permission granted to share information with : Yes, Verbal Permission Granted  Share Information with NAME: [name]     Permission granted to share info w Relationship: daughter  Permission granted to share info w Contact Information: [phone]  Emotional Assessment Appearance:: Appears younger than stated age Attitude/Demeanor/Rapport: Engaged Affect (typically observed): Calm, Happy, Accepting Orientation: : Oriented to Self, Oriented to Place, Oriented to Situation Alcohol / Substance Use: Not Applicable Psych Involvement: No (comment)  Admission diagnosis:  Hypokalemia [E87.6] Lower urinary tract infectious disease [N39.0] Patient Active Problem List   Diagnosis Date Noted  . Chronic breast pain 12/26/2018  . Pain in gums 12/26/2018  . History of deep vein thrombosis 10/17/2018  . Severe protein-calorie malnutrition Altamease Oiler: less than 60% of standard weight) ([place]) 10/17/2018  . Disorder associated with well-controlled type 2 diabetes mellitus ([place]) 10/17/2018  . History of pulmonary embolus (PE) 10/17/2018  . Hypomagnesemia 08/27/2018  . Left ventricular hypertrophy by electrocardiogram 08/27/2018  . Atypical chest pain 08/26/2018  . Advanced nonexudative age-related macular degeneration of  both eyes without subfoveal involvement 07/30/2018  .  Altered mental status 06/06/2018  . Microcytic anemia   . TIA (transient ischemic attack) 06/03/2018  . Mobility impaired 04/26/2018  . Protein-calorie malnutrition, severe 02/06/2017  . Urinary tract infection without hematuria 02/03/2017  . Hypokalemia 10/13/2016  . Pressure injury of heel, stage 2 ([place]) 08/31/2016  . Pulmonary embolus ([place]) 08/30/2016  . Dementia without behavioral disturbance ([place]) 06/01/2016  . Depression 01/20/2016  . Suicidal ideation 01/19/2016  . Prolapse of female pelvic organs 12/28/2015  . Leg weakness, bilateral 12/08/2015  . Weakness of right lower extremity 11/25/2015  . Dry eyes 10/28/2015  . (HFpEF) heart failure with preserved ejection fraction ([place])   . Gout 05/14/2015  . Delirium secondary to multiple medical problems 04/15/2015  . Primary hypertension   . Seasonal allergies 02/10/2015  . Stroke-like symptoms   . Stroke-like episode ([place]) 01/25/2015  . Syncope and collapse 01/25/2015  . Dizziness 09/19/2014  . Anemia of chronic illness 08/19/2014  . Acute superficial venous thrombosis of right lower extremity 08/19/2014  . Hypercalcemia 06/09/2014  . Breast cancer, left breast ([place]) 06/09/2014  . Breast cancer, right breast ([place]) 06/09/2014  . Edema of left lower extremity 03/30/2014  . PAD (peripheral artery disease) ([place]) 03/30/2014  . History of colon cancer 12/11/2013  . History of fall 05/15/2013  . Stricture and stenosis of esophagus 10/27/2012  . Thoracic back pain 05/24/2012  . Productive cough 04/30/2012  . Tobacco use disorder 06/02/2011  . Post-mastectomy pain 05/02/2011  . Hiatal hernia 03/22/2011  . 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 IDA (iron deficiency anemia) 04/29/2007  . Controlled type 2 diabetes mellitus with diabetic neuropathy, without long-term current use of insulin ([place]) 01/10/2007  . Coronary atherosclerosis 01/10/2007  . Asthma 01/10/2007  . Reflux esophagitis 01/10/2007  . Osteoarthritis,  multiple sites 01/10/2007   PCP:  Langdon Bing, DO Pharmacy:   [id number] [place] [place] [address] [place] [address] Phone: [phone] Fax: [phone]     Social Determinants of Health (SDOH) Interventions    Readmission Risk Interventions  No flowsheet data found.

## 2019-01-24 NOTE — ED Notes (Signed)
ED TO INPATIENT HANDOFF REPORT  ED Nurse Name and Phone #:  Freida Busman  [phone]  S Name/Age/Gender [name] 85 y.o. female Room/Bed: 033C/033C  Code Status   Code Status: Prior  Home/SNF/Other Home Patient oriented to: self, place and time Is this baseline? Yes   Triage Complete: Triage complete  Chief Complaint Weakness,Constipation  Triage Note BIB EMS from home. Pt called EMS for "not feeling well", reporting generalized weakness and bloated abd for about 1 week. Pt reports no BM since Sunday (3/8) and difficulty urinating since Tuesday (3/10). Hx MI, Afib. Edema noted to bilat LEs, but pt notes this to be baseline for her.    Allergies Allergies  Allergen Reactions  . Tape Other (See Comments)    Skin is very thin and will tear and bruise easily!!   . Lisinopril Cough    Level of Care/Admitting Diagnosis ED Disposition    ED Disposition Condition Comment   Admit  Hospital Area: [facility] [100100]  Level of Care: Med-Surg [16]  Diagnosis: Hypokalemia [427062]  Admitting Physician: Lind Covert [[id number]]  Attending Physician: CHAMBLISS, MARSHALL L [[id number]]  PT Class (Do Not Modify): Observation [104]  PT Acc Code (Do Not Modify): Observation [10022]       B Medical/Surgery History Past Medical History:  Diagnosis Date  . Allergy   . Anemia    Iron def anemia  . Arthritis   . Asthma   . Breast cancer ([place]) 10/2010   Invasive Ductal Carcinoma, s/p bilateral mastectomy, now on Tamoxifen. Followed by Dr Jamse Arn.   . Breast cancer ([place]) 2011   s/p Bilateral masectomy  . CAD (coronary artery disease)   . Carotid artery aneurysm ([place]) 08/2010   right ICA, 5 x 47mm  . CHF (congestive heart failure) ([facility])    Systolic   . Colon cancer ([place])   . Colon cancer ([place]) 12/11/2013  . DDD (degenerative disc disease), cervical   . Diabetes mellitus   . Diverticulosis   . GERD (gastroesophageal reflux disease)   . H/O: GI bleed   . Hiatal  hernia   . History of colon cancer   . Hypercalcemia 06/09/2014  . Hyperlipidemia   . Hypertension   . Myocardial infarction ([place])   . Neuropathy   . Pneumonia 03/2012  . Pressure injury of heel, stage 2 ([place]) 08/31/2016  . Shortness of breath    Past Surgical History:  Procedure Laterality Date  . Breast masectomy  10/2010   Bilateral masectomy by Dr Lucia Gaskins s/p invasive ductal carcinoma.  [facility] BREAST SURGERY    . Cardiac  Catherization  2007   Severe 3-vessel disease.  EF 20-25%.  . ESOPHAGOGASTRODUODENOSCOPY  2001   Esophageal Tear  . ESOPHAGOGASTRODUODENOSCOPY  10/27/2012   Procedure: ESOPHAGOGASTRODUODENOSCOPY (EGD);  Surgeon: Irene Shipper, MD;  Location: [facility] ENDOSCOPY;  Service: Endoscopy;  Laterality: N/A;  pat  . JOINT REPLACEMENT Right 2001   Knee  . TOTAL KNEE ARTHROPLASTY  2001     A IV Location/Drains/Wounds Patient Lines/Drains/Airways Status   Active Line/Drains/Airways    Name:   Placement date:   Placement time:   Site:   Days:   Peripheral IV 01/24/19 Left;Lateral Wrist   01/24/19    0058    Wrist   less than 1   Pressure Injury 08/30/16 Unstageable - Full thickness tissue loss in which the base of the ulcer is covered by slough (yellow, tan, gray, green or brown) and/or eschar (tan, brown or  black) in the wound bed. R heel blackened left heel calloused, peeling   08/30/16    1000     877   Pressure Injury 08/26/18 Deep Tissue Injury - Purple or maroon localized area of discolored intact skin or blood-filled blister due to damage of underlying soft tissue from pressure and/or shear. leteral heel calloused and peeling   08/26/18    2200     151          Intake/Output Last 24 hours No intake or output data in the 24 hours ending 01/24/19 0435  Labs/Imaging Results for orders placed or performed during the hospital encounter of 01/23/19 (from the past 48 hour(s))  CBC with Differential/Platelet     Status: Abnormal   Collection Time: 01/24/19  2:10 AM  Result  Value Ref Range   WBC 4.6 4.0 - 10.5 K/uL   RBC 4.67 3.87 - 5.11 MIL/uL   Hemoglobin 10.2 (L) 12.0 - 15.0 g/dL   HCT 34.1 (L) 36.0 - 46.0 %   MCV 73.0 (L) 80.0 - 100.0 fL   MCH 21.8 (L) 26.0 - 34.0 pg   MCHC 29.9 (L) 30.0 - 36.0 g/dL   RDW 16.1 (H) 11.5 - 15.5 %   Platelets 170 150 - 400 K/uL    Comment: REPEATED TO VERIFY   nRBC 0.0 0.0 - 0.2 %   Neutrophils Relative % 44 %   Neutro Abs 2.0 1.7 - 7.7 K/uL   Lymphocytes Relative 44 %   Lymphs Abs 2.0 0.7 - 4.0 K/uL   Monocytes Relative 8 %   Monocytes Absolute 0.4 0.1 - 1.0 K/uL   Eosinophils Relative 4 %   Eosinophils Absolute 0.2 0.0 - 0.5 K/uL   Basophils Relative 0 %   Basophils Absolute 0.0 0.0 - 0.1 K/uL   Immature Granulocytes 0 %   Abs Immature Granulocytes 0.01 0.00 - 0.07 K/uL    Comment: Performed at [facility], [address], [place], [place] [address]  Comprehensive metabolic panel     Status: Abnormal   Collection Time: 01/24/19  2:10 AM  Result Value Ref Range   Sodium 139 135 - 145 mmol/L   Potassium 2.6 (LL) 3.5 - 5.1 mmol/L    Comment: CRITICAL RESULT CALLED TO, READ BACK BY AND VERIFIED WITH: [name] M,RN 01/24/19 0338 WAYK    Chloride 109 98 - 111 mmol/L   CO2 24 22 - 32 mmol/L   Glucose, Bld 134 (H) 70 - 99 mg/dL   BUN 9 8 - 23 mg/dL   Creatinine, Ser 0.91 0.44 - 1.00 mg/dL   Calcium 10.4 (H) 8.9 - 10.3 mg/dL   Total Protein 6.9 6.5 - 8.1 g/dL   Albumin 3.0 (L) 3.5 - 5.0 g/dL   AST 13 (L) 15 - 41 U/L   ALT 9 0 - 44 U/L   Alkaline Phosphatase 60 38 - 126 U/L   Total Bilirubin 0.6 0.3 - 1.2 mg/dL   GFR calc non Af Amer 54 (L) >60 mL/min   GFR calc Af Amer >60 >60 mL/min   Anion gap 6 5 - 15    Comment: Performed at [facility], [address] [place], [place] [address]  I-stat troponin, ED     Status: None   Collection Time: 01/24/19  2:16 AM  Result Value Ref Range   Troponin i, poc 0.02 0.00 - 0.08 ng/mL   Comment 3  Comment: Due to the release kinetics of cTnI, a  negative result within the first hours of the onset of symptoms does not rule out myocardial infarction with certainty. If myocardial infarction is still suspected, repeat the test at appropriate intervals.   Urinalysis, Routine w reflex microscopic     Status: Abnormal   Collection Time: 01/24/19  2:23 AM  Result Value Ref Range   Color, Urine YELLOW YELLOW   APPearance HAZY (A) CLEAR   Specific Gravity, Urine 1.008 1.005 - 1.030   pH 6.0 5.0 - 8.0   Glucose, UA NEGATIVE NEGATIVE mg/dL   Hgb urine dipstick NEGATIVE NEGATIVE   Bilirubin Urine NEGATIVE NEGATIVE   Ketones, ur NEGATIVE NEGATIVE mg/dL   Protein, ur 100 (A) NEGATIVE mg/dL   Nitrite NEGATIVE NEGATIVE   Leukocytes,Ua MODERATE (A) NEGATIVE   RBC / HPF 0-5 0 - 5 RBC/hpf   WBC, UA 6-10 0 - 5 WBC/hpf   Bacteria, UA MANY (A) NONE SEEN   Squamous Epithelial / LPF 0-5 0 - 5   Mucus PRESENT     Comment: Performed at [facility], [address] [place], [place] [address]   *Note: Due to a large number of results and/or encounters for the requested time period, some results have not been displayed. A complete set of results can be found in Results Review.   Dg Abd Acute 2+v W 1v Chest  Result Date: 01/24/2019 CLINICAL DATA:  Generalized weakness and bloating EXAM: DG ABDOMEN ACUTE W/ 1V CHEST COMPARISON:  11/27/2018 CXR, chest CT 08/30/2016 FINDINGS: No acute pulmonary consolidation. Top-normal heart size with slight uncoiling of the atherosclerotic aorta. Moderate chronic right hilar prominence with pulmonary hyperinflation likely reflects chronic pulmonary hypertension. Nonobstructed bowel gas pattern. No free air, organomegaly nor suspicious radiopaque calculi. Phleboliths are noted in pelvis. Thoracolumbar spondylosis is noted with osteoarthritis of the hips. No acute nor aggressive osseous lesions. Aortoiliac atherosclerosis. Chain sutures are present in the mid abdomen. A 2 mm calcification the right upper quadrant may  reflect a renal stone, gallstone or potentially vascular calcification among some considerations. IMPRESSION: 1. Nonobstructed bowel gas pattern. 2. Mild pulmonary hyperinflation with pulmonary vascular prominence likely reflecting chronic pulmonary hypertension. 3. 2 mm calcification in the right upper quadrant question renal stone versus gallstone versus vascular calcification. 4. Thoracolumbar spondylosis. Electronically Signed   By: Ashley Royalty M.D.   On: 01/24/2019 00:49    Pending Labs Unresulted Labs (From admission, onward)    Start     Ordered   01/24/19 0341  Magnesium  ONCE - STAT,   STAT     01/24/19 0341          Vitals/Pain Today's Vitals   01/24/19 0330 01/24/19 0345 01/24/19 0400 01/24/19 0415  BP: (!) 138/46 (!) 133/51 (!) 146/47 (!) 142/67  Pulse:      Resp: 19 18 18 18   Temp:      TempSrc:      SpO2:      Weight:      Height:      PainSc:        Isolation Precautions No active isolations  Medications Medications  cefTRIAXone (ROCEPHIN) 1 g in sodium chloride 0.9 % 100 mL IVPB (1 g Intravenous New Bag/Given 01/24/19 0422)  potassium chloride 10 mEq in 100 mL IVPB (10 mEq Intravenous New Bag/Given 01/24/19 0422)    Mobility walks with person assist High fall risk   Focused Assessments Cardiac Assessment Handoff:  Cardiac Rhythm: Normal  sinus rhythm Lab Results  Component Value Date   CKTOTAL 109 08/14/2014   CKMB 2.0 04/26/2011   TROPONINI <0.03 08/27/2018   Lab Results  Component Value Date   DDIMER 1.35 (H) 06/25/2012   Does the Patient currently have chest pain? No     R Recommendations: See Admitting Provider Note  Report given to:   Additional Notes: Pt not a great historian. No family presenting with her, so unsure of ambulation status. Per EMS, it sounds as if family doesn't get her out of bed much. Daughter handles all her meds. Pt doesn't know what she takes or when.

## 2019-01-24 NOTE — ED Notes (Addendum)
Admitting Providers at bedside. 

## 2019-01-24 NOTE — Care Management Obs Status (Signed)
[facility] NOTIFICATION   Patient Details  Name: [name] MRN: [id number] Date of Birth: 1924/09/24   Medicare Observation Status Notification Given:  Yes    Bartholomew Crews, RN 01/24/2019, 4:23 PM

## 2019-01-24 NOTE — ED Notes (Signed)
IV attempt by primary RN and myself. Unsuccessful. Pt refused to let me stick in R hand.

## 2019-01-24 NOTE — H&P (Addendum)
[facility] Admission History and Physical Service Pager: [phone]  [name] Medical record number: [id number] Date of birth: [birth year] Age: 85 y.o. Gender: female  Primary Care Provider: [place] Bing, DO Consultants: None Code Status: DNR/DNI (discussed on admission)   Chief Complaint: Constipation and weakness  Assessment and Plan: [name] is a 85 y.o. female presenting with constipation and fatigue for 1 week.  She has a previous medical history significant for constipation, HFpEF, HTN, T2DM, HLD, GERD, malnutrition.   Hypokalemia  History unremarkable for chest pain or palpitations.  On admission, her vitals were remarkable only for hypertension with systolics up to 626.  Admission labs were remarkable for a potassium of 2.6 and magnesium of 1.9.  EKG shows U waves in leads V4 and V5.  Differential for hypokalemia includes: Medication side effects, low magnesium vs poor nutrition. She does not have any h/o recent GI losses which may be contributing.  Review of her home medication shows no loop or thiazide diuretics.  Magnesium is within normal limits at 1.9.  Poor nutrition may be the reason for the low potassium.  A more thorough diet history may be helpful if the daughter is available for further questions.  Chart review shows that she was found to be hypokalemic during her last hospital admission with a potassium of 2.7 on admission. No work-up was performed at that time.  Per last PCP note she is on kdur outpatient however unsure if taking.   -Admit to inpatient, attending Dr. Erin Hearing  -Potassium IV 10 mEq every hour, x4 in ED  -Potassium p.o. 20 mEq every 2 hours, x2  -Repeat BMP at 10 AM  -Monitor vitals   Asymptomatic bacteriuria UA in ED was remarkable for +leuks, many bacteria and no nitrite.  She specifically denies dysuria, polyuria, suprapubic pain, back pain. This is consistent with asymptomatic bacteriuria. Was  started on CTX in ED.  This is not a urinary tract infection should not be treated.   Will not continue antibiotics at this time. -S/p ceftriaxone x1  -No further antibiotics at this time  -continue to monitor   Constipation She reports that her last bowel movement was about 1 week ago.  She also reports significant straining during her last bowel movement.  No significant stool burden noted on KUB on admission and she denies abdominal pain.  Abdominal exam unremarkable.  She was prescribed a stool softener this past week but does not recall taking this medication.  Her calcium was noted to be 10.4, only marginally above the normal value of 10.3.  This may be a minor contribution to her constipation.  -MiraLAX 17 g daily  -Senna twice daily   Anemia, iron deficiency, chronic-stable H/o iron deficiency anemia with a hemoglobin of around 10 at baseline.  She presented today with hemoglobin of 10.2 and an MCV of 73.  These labs consistent with her previous iron deficiency anemia.  Iron studies done 05/2018 showed an iron 50, ferritin 23, TIBC 276 with ferritin rising to 238 on 10/2018 following iron supplementation.  No further work-up is necessary at this time. -Continue to monitor  Malnutrition, chronic Albumin on admission 3.0.  She takes protein supplement shakes at home date her malnutrition. -Continue Ensure  HFpEF, chronic-stable Last ECHO done on 7/23 showed an EF of 65% with grade 1 diastolic dysfunction.  Physical exam was notable for no respiratory distress, no appreciable JVD, mild rales and lower lung fields bilaterally.  Lower  extremity exam notable for 2+ pitting edema.  No concern for acute decompensated heart failure at this time.  Home medications include losartan 50 mg daily and carvedilol 12.5 mg twice daily. -Continue losartan 50 mg daily -Continue carvedilol 12.5 mg twice daily  HTN  She has a history of chronic hypertension.  Blood pressure on admission was remarkable for  systolic pressure to 638.  Home medications include amlodipine 5 mg daily and heart failure medications noted above. -Continue amlodipine -Repeat blood pressure measurement variance following morning medications  T2DM Well controlled without medications. Per chart review, last A1c was 6.7 (06/04/18).  Blood glucose on admission was in the 130s.  No need for tight blood glucose control at this time. -Monitor symptomatically  HLD At home on rosuvastatin 20 mg daily. Last lipid panel done 05/2018 shows total cholesterol of 78, LDL 32.  In a 85 year old with a code status of DNR statin may not be helpful for medical goals.  We will continue with home medication for now.  Consider discontinuation of statin with PCP. -Continue home med  History of TIA in 05/2018 No known residual deficits and normal neuro exam on admission. Following her TIA in 2019, she was put on full dose aspirin and Plavix for 3 weeks followed by monotherapy with full dose aspirin.  -Continue home aspirin   GERD At home on Protonix 40 mg daily  -Continue home med   Nicotine use d/o She reports a long history of dipping snuff.  She is not interested in a nicotine patch at this time. -Continue to monitor  FEN/GI: Carb modified/heart healthy, IV NS @ 90 ml/hr x12h Prophylaxis: lovenox   Disposition: Admit to med-surg, attending Dr. Erin Hearing - likely discharge home on 3/13 following potassium repletion.  History of Present Illness:  [name] is a 85 y.o. female presenting with constipation and fatigue for 1 week.  She has a previous medical history significant for constipation, HFpEF, HTN, T2DM, HLD, GERD.   Per [name] report, she has been feeling uncomfortable for the past week due to constipation.  She reports that she has not had a bowel movement in the past week and has been straining significantly with recent bowel movements.  She also reports abdominal distention and trouble belching.  She was prescribed a  stool softener this past week but does not remember if she ended up taking any.  In addition to her constipation, she reports increased fatigue for about 1 week.  She denies new focal or diffuse weakness.  Due to her increasing fatigue and concern regarding constipation, EMS was called early in the morning of 3/13.  On review of systems, she noted ongoing cough and congestion for about 2 weeks.  She specifically denied fever, nausea, stomach pain, suprapubic pain, dysuria, polyuria, back pain.  In the ED, she was found to be hypokalemic to 2.6, a KUB revealed no significant stool burden.  Urine microscopy revealed bacteria and white blood cells so  she was given 1 dose of ceftriaxone.  She is unable to tell us which medications she is on as her daughter manages these.  Daughter did not accompany to ED and number provided in chart [name]) is not the daughter she lives with.   Review Of Systems: Per HPI with the following additions:   Review of Systems  Constitutional: Positive for malaise/fatigue. Negative for fever.  HENT: Positive for congestion. Negative for hearing loss and sore throat.   Eyes: Negative for blurred vision.  Respiratory:  Positive for cough and sputum production. Negative for hemoptysis and shortness of breath.   Cardiovascular: Positive for leg swelling (chronic). Negative for chest pain and palpitations.  Gastrointestinal: Positive for constipation. Negative for abdominal pain, diarrhea, nausea and vomiting.  Genitourinary: Negative for dysuria, flank pain, frequency and urgency.  Musculoskeletal: Negative for back pain.  Neurological: Positive for weakness. Negative for headaches.   Patient Active Problem List   Diagnosis Date Noted  . Chronic breast pain 12/26/2018  . Pain in gums 12/26/2018  . History of deep vein thrombosis 10/17/2018  . Severe protein-calorie malnutrition Altamease Oiler: less than 60% of standard weight) ([place]) 10/17/2018  . Disorder associated with  well-controlled type 2 diabetes mellitus ([place]) 10/17/2018  . History of pulmonary embolus (PE) 10/17/2018  . Hypomagnesemia 08/27/2018  . Left ventricular hypertrophy by electrocardiogram 08/27/2018  . Atypical chest pain 08/26/2018  . Advanced nonexudative age-related macular degeneration of both eyes without subfoveal involvement 07/30/2018  . Altered mental status 06/06/2018  . Microcytic anemia   . TIA (transient ischemic attack) 06/03/2018  . Mobility impaired 04/26/2018  . Protein-calorie malnutrition, severe 02/06/2017  . Urinary tract infection without hematuria 02/03/2017  . Hypokalemia 10/13/2016  . Pressure injury of heel, stage 2 ([place]) 08/31/2016  . Pulmonary embolus ([place]) 08/30/2016  . Dementia without behavioral disturbance ([place]) 06/01/2016  . Depression 01/20/2016  . Suicidal ideation 01/19/2016  . Prolapse of female pelvic organs 12/28/2015  . Leg weakness, bilateral 12/08/2015  . Weakness of right lower extremity 11/25/2015  . Dry eyes 10/28/2015  . (HFpEF) heart failure with preserved ejection fraction ([place])   . Gout 05/14/2015  . Delirium secondary to multiple medical problems 04/15/2015  . Primary hypertension   . Seasonal allergies 02/10/2015  . Stroke-like symptoms   . Stroke-like episode ([place]) 01/25/2015  . Syncope and collapse 01/25/2015  . Dizziness 09/19/2014  . Anemia of chronic illness 08/19/2014  . Acute superficial venous thrombosis of right lower extremity 08/19/2014  . Hypercalcemia 06/09/2014  . Breast cancer, left breast ([place]) 06/09/2014  . Breast cancer, right breast ([place]) 06/09/2014  . Edema of left lower extremity 03/30/2014  . PAD (peripheral artery disease) ([place]) 03/30/2014  . History of colon cancer 12/11/2013  . History of fall 05/15/2013  . Stricture and stenosis of esophagus 10/27/2012  . Thoracic back pain 05/24/2012  . Productive cough 04/30/2012  . Tobacco use disorder 06/02/2011  . Post-mastectomy pain 05/02/2011  . Hiatal  hernia 03/22/2011  . 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 IDA (iron deficiency anemia) 04/29/2007  . Controlled type 2 diabetes mellitus with diabetic neuropathy, without long-term current use of insulin ([place]) 01/10/2007  . Coronary atherosclerosis 01/10/2007  . Asthma 01/10/2007  . Reflux esophagitis 01/10/2007  . Osteoarthritis, multiple sites 01/10/2007   Past Medical History: Past Medical History:  Diagnosis Date  . Allergy   . Anemia    Iron def anemia  . Arthritis   . Asthma   . Breast cancer ([place]) 10/2010   Invasive Ductal Carcinoma, s/p bilateral mastectomy, now on Tamoxifen. Followed by Dr Jamse Arn.   . Breast cancer ([place]) 2011   s/p Bilateral masectomy  . CAD (coronary artery disease)   . Carotid artery aneurysm ([place]) 08/2010   right ICA, 5 x 68mm  . CHF (congestive heart failure) (H[facility])    Systolic   . Colon cancer ([place])   . Colon cancer ([place]) 12/11/2013  . DDD (degenerative disc disease), cervical   . Diabetes mellitus   . Diverticulosis   .  GERD (gastroesophageal reflux disease)   . H/O: GI bleed   . Hiatal hernia   . History of colon cancer   . Hypercalcemia 06/09/2014  . Hyperlipidemia   . Hypertension   . Myocardial infarction ([place])   . Neuropathy   . Pneumonia 03/2012  . Pressure injury of heel, stage 2 ([place]) 08/31/2016  . Shortness of breath    Past Surgical History: Past Surgical History:  Procedure Laterality Date  . Breast masectomy  10/2010   Bilateral masectomy by Dr Lucia Gaskins s/p invasive ductal carcinoma.  Marland Kitchen BREAST SURGERY    . Cardiac  Catherization  2007   Severe 3-vessel disease.  EF 20-25%.  . ESOPHAGOGASTRODUODENOSCOPY  2001   Esophageal Tear  . ESOPHAGOGASTRODUODENOSCOPY  10/27/2012   Procedure: ESOPHAGOGASTRODUODENOSCOPY (EGD);  Surgeon: Irene Shipper, MD;  Location: [facility] ENDOSCOPY;  Service: Endoscopy;  Laterality: N/A;  pat  . JOINT REPLACEMENT Right 2001   Knee  . TOTAL KNEE ARTHROPLASTY  2001   Social History: Social History    Tobacco Use  . Smoking status: Never Smoker  . Smokeless tobacco: Current User    Types: Snuff  Substance Use Topics  . Alcohol use: No  . Drug use: No   Additional social history: Lives at home with her daughter who manages her medications.  She reports tobacco use, dips snuff.  She denies alcohol and illicit drug use.  Please also refer to relevant sections of EMR.  Family History: Family History  Problem Relation Age of Onset  . Sickle cell anemia Other   . Heart disease Mother   . Heart disease Father   . Cancer Daughter 63       breast ca  . Hypertension Daughter   . Cancer Daughter 35       breast ca  . Hypertension Daughter   . Heart disease Son        Poor circulation-Left Leg  . Diabetes Daughter   . Hypertension Daughter   . Hyperlipidemia Daughter        Poor circulation- Toe amputation   Allergies and Medications: Allergies  Allergen Reactions  . Tape Other (See Comments)    Skin is very thin and will tear and bruise easily!!   . Lisinopril Cough   No current facility-administered medications on file prior to encounter.    Current Outpatient Medications on File Prior to Encounter  Medication Sig Dispense Refill  . acetaminophen (TYLENOL) 500 MG tablet Take 500 mg by mouth every 6 (six) hours as needed for moderate pain.     Marland Kitchen albuterol (PROVENTIL HFA;VENTOLIN HFA) 108 (90 BASE) MCG/ACT inhaler Inhale 2 puffs into the lungs every 6 (six) hours as needed. For shortness of breath (Patient not taking: Reported on 10/22/2018) 1 Inhaler 6  . Amino Acids-Protein Hydrolys (FEEDING SUPPLEMENT, PRO-STAT SUGAR FREE 64,) LIQD Take 30 mLs by mouth 2 (two) times daily with a meal. ensure    . amLODipine (NORVASC) 5 MG tablet Take 1 tablet (5 mg total) by mouth daily. 90 tablet 3  . aspirin 325 MG EC tablet TAKE 1 TABLET BY MOUTH EVERY DAY    . aspirin EC 325 MG tablet Take 1 tablet (325 mg total) by mouth daily. 90 tablet 3  . capsaicin (ZOSTRIX) 0.025 % cream APPLY  TO AFFECTED AREA TWICE A DAY (Patient not taking: Reported on 11/27/2018) 60 g 1  . carvedilol (COREG) 12.5 MG tablet TAKE 1 TABLET BY MOUTH TWICE A DAY WITH A MEAL (Patient taking  differently: Take 12.5 mg by mouth 2 (two) times daily with a meal. ) 90 tablet 3  . diclofenac sodium (VOLTAREN) 1 % GEL Apply 2 g topically 4 (four) times daily. (Patient not taking: Reported on 11/27/2018) 1 Tube 0  . fluticasone (FLONASE) 50 MCG/ACT nasal spray Place 1 spray into both nostrils daily. 1 g 0  . guaifenesin (ROBITUSSIN) 100 MG/5ML syrup Take 10 mLs (200 mg total) by mouth 3 (three) times daily as needed for cough. (Patient not taking: Reported on 11/27/2018) 120 mL 0  . hydroxypropyl methylcellulose / hypromellose (ISOPTO TEARS / GONIOVISC) 2.5 % ophthalmic solution Place 1 drop 3 (three) times daily into both eyes. (Patient not taking: Reported on 11/27/2018) 15 mL 12  . loratadine (CLARITIN) 10 MG tablet TAKE 1/2 TABLET BY MOUTH DAILY (Patient taking differently: Take 5 mg by mouth daily. ) 15 tablet 0  . losartan (COZAAR) 50 MG tablet Take 1 tablet (50 mg total) by mouth daily. 90 tablet 3  . pantoprazole (PROTONIX) 40 MG tablet TAKE 1 TABLET BY MOUTH EVERY DAY (Patient taking differently: Take 40 mg by mouth daily. ) 90 tablet 3  . PATADAY 0.2 % SOLN APPLY 1 DROP TO EYE DAILY. (Patient not taking: Reported on 11/27/2018) 2.5 mL 0  . phenazopyridine (PYRIDIUM) 200 MG tablet Take 1 tablet (200 mg total) by mouth 3 (three) times daily as needed for pain. (Patient not taking: Reported on 11/27/2018) 10 tablet 0  . ranitidine (ZANTAC) 150 MG tablet TAKE 1 TABLET BY MOUTH TWICE A DAY (Patient not taking: Reported on 11/27/2018) 180 tablet 1  . rosuvastatin (CRESTOR) 20 MG tablet TAKE 1 TABLET BY MOUTH EVERYDAY AT BEDTIME (Patient taking differently: Take 20 mg by mouth at bedtime. ) 90 tablet 2  . senna-docusate (SENOKOT-S) 8.6-50 MG tablet Take 2 tablets by mouth at bedtime. (Patient not taking: Reported on  11/27/2018) 60 tablet 3  . silver sulfADIAZINE (SILVADENE) 1 % cream Apply 1 application topically daily. (Patient not taking: Reported on 10/22/2018) 50 g 0  . traMADol (ULTRAM) 50 MG tablet Take 50 mg by mouth every 6 (six) hours as needed for moderate pain.   0  . [DISCONTINUED] cetirizine (ZYRTEC) 5 MG tablet Take 1 tablet (5 mg total) by mouth daily. Take at night time as it can cause some drowsiness 30 tablet 4   Objective: BP (!) 162/54 (BP Location: Left Arm)   Pulse 63   Temp 98.1 F (36.7 C) (Oral)   Resp 18   Ht 4\' 11"  (1.499 m)   Wt 50.4 kg   SpO2 100%   BMI 22.46 kg/m    Exam: Physical Exam Constitutional:      Appearance: She is normal weight. She is not ill-appearing.  Eyes:     Conjunctiva/sclera: Conjunctivae normal.     Pupils: Pupils are equal, round, and reactive to light.  Neck:     Musculoskeletal: No neck rigidity or muscular tenderness.     Comments: No appreciable JVD Cardiovascular:     Rate and Rhythm: Normal rate.     Heart sounds: Murmur (2/6 systolic blowing loudest at lest sternal border) present. No friction rub. No gallop.   Pulmonary:     Effort: Pulmonary effort is normal. No respiratory distress.     Breath sounds: No stridor. Rales (in lower fields bilaterally) present. No wheezing.  Abdominal:     General: Abdomen is flat. Bowel sounds are normal. There is no distension or abdominal bruit.  Palpations: Abdomen is soft.     Tenderness: There is no abdominal tenderness.  Musculoskeletal:        General: No tenderness.     Right lower leg: Edema (chronic) present.     Left lower leg: Edema (chronic) present.  Lymphadenopathy:     Cervical: No cervical adenopathy.  Skin:    General: Skin is warm and dry.     Capillary Refill: Capillary refill takes less than 2 seconds.  Neurological:     General: No focal deficit present.     Mental Status: She is alert.     Cranial Nerves: No cranial nerve deficit.     Motor: No weakness.   Psychiatric:        Mood and Affect: Mood normal.        Behavior: Behavior normal.    Labs and Imaging: CBC BMET  Recent Labs  Lab 01/24/19 0210  WBC 4.6  HGB 10.2*  HCT 34.1*  PLT 170   Recent Labs  Lab 01/24/19 0210  NA 139  K 2.6*  CL 109  CO2 24  BUN 9  CREATININE 0.91  GLUCOSE 134*  CALCIUM 10.4*     Dg Abd Acute 2+v W 1v Chest  Result Date: 01/24/2019 CLINICAL DATA:  Generalized weakness and bloating EXAM: DG ABDOMEN ACUTE W/ 1V CHEST COMPARISON:  11/27/2018 CXR, chest CT 08/30/2016 FINDINGS: No acute pulmonary consolidation. Top-normal heart size with slight uncoiling of the atherosclerotic aorta. Moderate chronic right hilar prominence with pulmonary hyperinflation likely reflects chronic pulmonary hypertension. Nonobstructed bowel gas pattern. No free air, organomegaly nor suspicious radiopaque calculi. Phleboliths are noted in pelvis. Thoracolumbar spondylosis is noted with osteoarthritis of the hips. No acute nor aggressive osseous lesions. Aortoiliac atherosclerosis. Chain sutures are present in the mid abdomen. A 2 mm calcification the right upper quadrant may reflect a renal stone, gallstone or potentially vascular calcification among some considerations. IMPRESSION: 1. Nonobstructed bowel gas pattern. 2. Mild pulmonary hyperinflation with pulmonary vascular prominence likely reflecting chronic pulmonary hypertension. 3. 2 mm calcification in the right upper quadrant question renal stone versus gallstone versus vascular calcification. 4. Thoracolumbar spondylosis. Electronically Signed   By: Ashley Royalty M.D.   On: 01/24/2019 00:49    [name] MD 01/24/2019, 6:51 AM PGY-1, [facility] Intern pager: [phone], text pages welcome  I have seen and evaluated the above patient with Dr. Pilar Plate and agree with his documentation.  I have included my edits in blue.   Lovenia Kim MD [place] PGY-3

## 2019-01-24 NOTE — ED Notes (Signed)
Patient transported to X-ray 

## 2019-01-24 NOTE — Care Management Obs Status (Signed)
[facility] NOTIFICATION   Patient Details  Name: KALEYA DOUSE MRN: [id number] Date of Birth: 05/16/1924   Medicare Observation Status Notification Given:  Yes    Bartholomew Crews, RN 01/24/2019, 3:49 PM

## 2019-01-25 DIAGNOSIS — E876 Hypokalemia: Secondary | ICD-10-CM | POA: Diagnosis not present

## 2019-01-25 LAB — BASIC METABOLIC PANEL
Anion gap: 7 (ref 5–15)
BUN: 6 mg/dL — ABNORMAL LOW (ref 8–23)
CO2: 23 mmol/L (ref 22–32)
CREATININE: 0.78 mg/dL (ref 0.44–1.00)
Calcium: 10.3 mg/dL (ref 8.9–10.3)
Chloride: 112 mmol/L — ABNORMAL HIGH (ref 98–111)
GFR calc Af Amer: >60 mL/min (ref >60)
GFR calc non Af Amer: >60 mL/min (ref >60)
Glucose, Bld: 110 mg/dL — ABNORMAL HIGH (ref 70–99)
Potassium: 3.5 mmol/L (ref 3.5–5.1)
Sodium: 142 mmol/L (ref 135–145)

## 2019-01-25 MED ORDER — PRO-STAT SUGAR FREE PO LIQD: 30.0000 mL | Freq: Two times a day (BID) | ORAL | 0 refills | Status: AC

## 2019-01-25 MED ORDER — LOSARTAN POTASSIUM 50 MG PO TABS
50.0000 mg | ORAL_TABLET | Freq: Every day | ORAL | Status: DC
  Administered 2019-01-25: 50 mg via ORAL
  Filled 2019-01-25: qty 1

## 2019-01-25 MED ORDER — POLYETHYLENE GLYCOL 3350 17 G PO PACK: 17.0000 g | PACK | Freq: Every day | ORAL | 0 refills | Status: AC

## 2019-01-25 NOTE — Progress Notes (Signed)
Family Medicine Teaching Service Daily Progress Note Intern Pager: [phone]  [name] Medical record number: [id number] Date of birth: [birth year] Age: 85 y.o. Gender: female  Primary Care Provider: [place] Bing, DO Consultants: none Code Status: DNR  Pt Overview and Major Events to Date:  3/13 - admit for hypokalemia  Assessment and Plan: [name] is a 85 y.o. female presenting with constipation and fatigue for 1 week.  She has a previous medical history significant for constipation, HFpEF, HTN, T2DM, HLD, GERD, malnutrition.   Hypokalemia-improved Repletion throughout 3/13 has brought potassium back to within normal limits at 3.8.  The cause of her hypokalemia remains unclear and is still thought to be due to either medication or nutrition.  It would be helpful to talk directly to the family to verify what medications are taken at home.  If this conversation is unrevealing, it may be helpful to work-up for hyperaldosteronism or Cushing's disease.  Now that her potassium is within normal limits, it may be most helpful to continue work-up outpatient. -Discussed home medications with family -discharge 3/14 with close follow up outpt  Asymptomatic bacteriuria-stable She has remained afebrile without signs or symptoms of urinary tract infection. -Continue to monitor  Constipation 2 bowel movements noted since admission.  No new complaints this morning. -Continue MiraLAX -Continue senna  Anemia, iron deficiency, chronic-stable -Continue to monitor outpatient  Malnutrition, chronic -Continue Ensure  Heart failure with preserved ejection fraction, chronic-stable No fluid overload evident on exam. -Continue carvedilol 12.5 mg twice daily -Holding losartan out of concern for hyperkalemia  Hypertension She is remained hypertensive since admission.  Most recent blood pressure 156/51.  This is likely to holding home medications including losartan. -Continue  carvedilol -Continue amlodipine -Consider restarting losartan  Diabetes, type II, chronic, stable Blood sugars noted during hospitalization so far ranging from 118-191.  No need for tight glucose control at this time.  History of TIA -Continue rosuvastatin -Continue full dose aspirin  GERD -Continue Protonix  Nicotine use disorder Not interested in nicotine replacement therapy at this time.  FEN/GI: Carb modified/heart healthy PPx: Lovenox  Disposition: Discharge home on 3/14  Subjective:  No acute events overnight.  She reports feeling significantly improved compared to admission.  She feels less fatigued with better strength.  She also reports feeling much more comfortable following 2 bowel movements.  Objective: Temp:  [98.1 F (36.7 C)-98.3 F (36.8 C)] 98.3 F (36.8 C) (03/13 2100) Pulse Rate:  [62-64] 62 (03/13 2100) Resp:  [17-21] 18 (03/13 2100) BP: (133-163)/(45-67) 156/51 (03/13 2100) SpO2:  [99 %-100 %] 100 % (03/13 2100)  Physical Exam: General: Alert and cooperative and appears to be in no acute distress HEENT: Neck non-tender without lymphadenopathy, masses or thyromegaly Cardio: Normal S1 and S2, no S3 or S4. Rhythm is regular. No murmurs or rubs.   Pulm: Clear to auscultation bilaterally, no crackles, wheezing, or diminished breath sounds. Normal respiratory effort Abdomen: Bowel sounds normal. Abdomen soft and non-tender.  Extremities: No peripheral edema. Warm/ well perfused.  Strong radial pulses. Neuro: Cranial nerves grossly intact   Laboratory: Recent Labs  Lab 01/24/19 0210  WBC 4.6  HGB 10.2*  HCT 34.1*  PLT 170   Recent Labs  Lab 01/24/19 0210 01/24/19 1421 01/24/19 2030  NA 139 142 140  K 2.6* 3.6 3.8  CL 109 112* 115*  CO2 24 25 22   BUN 9 9 10   CREATININE 0.91 0.91 0.89  CALCIUM 10.4* 10.2 10.1  PROT  6.9  --   --   BILITOT 0.6  --   --   ALKPHOS 60  --   --   ALT 9  --   --   AST 13*  --   --   GLUCOSE 134* 142* 137*     Imaging/Diagnostic Tests: No results found.   [name], MD 01/25/2019, 1:23 AM PGY-1, [facility] Intern pager: [phone], text pages welcome

## 2019-01-25 NOTE — Progress Notes (Signed)
M.D called,patient 's daughter was informed of her impending discharged early afternoon.Kathleene Hazel, arrived to get patient.,explained and gave him the patient's discharged papers.Reminded  them of her medical appointments.Questions answered satisfactorily at the time of discharged.

## 2019-01-25 NOTE — Discharge Summary (Signed)
[facility] Discharge Summary  [name] Medical record number: [id number] Date of birth: 1924-10-16 Age: 85 y.o. Gender: female Date of Admission: 01/23/2019  Date of Discharge: 01/25/2019 Admitting Physician: Baird Kay, MD  Primary Care Provider: [place] Bing, DO Consultants: none  Indication for Hospitalization: Hypokalemia  Discharge Diagnoses/Problem List:  Hypokalemia Asymptomatic bacteriuria Constipation Anemia Malnutrition Heart failure Hypertension Diabetes History of TIA GERD  Disposition: Discharge home  Discharge Condition: Stable  Discharge Exam:  General: Alert and cooperative and appears to be in no acute distress HEENT: Neck non-tender without lymphadenopathy, masses or thyromegaly Cardio: Normal S1 and S2, no S3 or S4. Rhythm is regular. No murmurs or rubs.   Pulm: Clear to auscultation bilaterally, no crackles, wheezing, or diminished breath sounds. Normal respiratory effort Abdomen: Bowel sounds normal. Abdomen soft and non-tender.  Extremities: No peripheral edema. Warm/ well perfused.  Strong radial pulses. Neuro: Cranial nerves grossly intact  Brief Hospital Course:   Hypokalemia She was admitted early in the morning of 3/13 with hypokalemia to 2.6 and U waves noted on EKG.  She received potassium repletion via IV and p.o.  By the evening of 3/13 her potassium was found to be within normal range at 3.8.  In the morning of 3/14 potassium remained in normal range at 3.5.  Ultimately, the cause of her hypokalemia was not found.  Home medications were reviewed with her daughter who is her primary caretaker.  She does not currently use any loop diuretics or other medications that classically cause hypokalemia.  Potential causes of this hypokalemia include hyperaldosteronism Cushing's disease.  Work-up of possible etiologies for this low potassium may not provide significant benefit to this  85 year old woman.  She was advised to follow-up closely with her outpatient provider to ensure appropriate control of her potassium levels.  She has a follow-up appointment with her PCP on 3/18.  Constipation On admission, she reported no bowel movement for about 1 week.  She was put on a regimen of MiraLAX and senna.  She had 2 bowel movements while in the hospital and reported feeling significantly more comfortable.  She was recommended to continue MiraLAX daily to avoid future episodes of constipation.  Issues for Follow Up:  1. Check potassium level.  Consider potassium supplementation or spironolactone for better potassium control. 2. Clarify losartan dose.  On admission, there is confusion about appropriate dose of losartan.  She appeared to take 50 mg dose in the morning and a 100 mg dose in the evening.  On discharge, she was told to only take the 50 mg dose once daily.  She should not be taking a 100 mg dose. 3. Assess blood pressure.  She remained mildly hypertensive during her hospitalization.  This may simply be due the fact that her antihypertensive medication was held during her hospitalization.  Significant Procedures: None  Significant Labs and Imaging:  Recent Labs  Lab 01/24/19 0210  WBC 4.6  HGB 10.2*  HCT 34.1*  PLT 170   Recent Labs  Lab 01/24/19 0210 01/24/19 1421 01/24/19 2030 01/25/19 0540  NA 139 142 140 142  K 2.6* 3.6 3.8 3.5  CL 109 112* 115* 112*  CO2 24 25 22 23   GLUCOSE 134* 142* 137* 110*  BUN 9 9 10  6*  CREATININE 0.91 0.91 0.89 0.78  CALCIUM 10.4* 10.2 10.1 10.3  MG 1.9  --   --   --   ALKPHOS 60  --   --   --  AST 13*  --   --   --   ALT 9  --   --   --   ALBUMIN 3.0*  --   --   --       Results/Tests Pending at Time of Discharge: None  Discharge Medications:  Allergies as of 01/25/2019      Reactions   Tape Other (See Comments)   Skin is very thin and will tear and bruise easily!!   Lisinopril Cough      Medication List     STOP taking these medications   amLODipine 5 MG tablet Commonly known as:  NORVASC     TAKE these medications   acetaminophen 500 MG tablet Commonly known as:  TYLENOL Take 500 mg by mouth every 6 (six) hours as needed for moderate pain.   albuterol 108 (90 Base) MCG/ACT inhaler Commonly known as:  PROVENTIL HFA;VENTOLIN HFA Inhale 2 puffs into the lungs every 6 (six) hours as needed. For shortness of breath   aspirin EC 325 MG tablet Take 1 tablet (325 mg total) by mouth daily.   carvedilol 12.5 MG tablet Commonly known as:  COREG TAKE 1 TABLET BY MOUTH TWICE A DAY WITH A MEAL What changed:    how much to take  how to take this  when to take this  additional instructions   diclofenac sodium 1 % Gel Commonly known as:  VOLTAREN Apply 2 g topically 4 (four) times daily.   feeding supplement (PRO-STAT SUGAR FREE 64) Liqd Take 30 mLs by mouth 2 (two) times daily with a meal. ensure   fluticasone 50 MCG/ACT nasal spray Commonly known as:  Flonase Place 1 spray into both nostrils daily.   loratadine 10 MG tablet Commonly known as:  CLARITIN TAKE 1/2 TABLET BY MOUTH DAILY What changed:    when to take this  reasons to take this   losartan 50 MG tablet Commonly known as:  COZAAR Take 1 tablet (50 mg total) by mouth daily. What changed:    how much to take  when to take this  additional instructions   pantoprazole 40 MG tablet Commonly known as:  PROTONIX TAKE 1 TABLET BY MOUTH EVERY DAY   polyethylene glycol packet Commonly known as:  MIRALAX / GLYCOLAX Take 17 g by mouth daily.   ranitidine 150 MG tablet Commonly known as:  ZANTAC TAKE 1 TABLET BY MOUTH TWICE A DAY   rosuvastatin 20 MG tablet Commonly known as:  CRESTOR TAKE 1 TABLET BY MOUTH EVERYDAY AT BEDTIME   senna-docusate 8.6-50 MG tablet Commonly known as:  Senokot-S Take 2 tablets by mouth at bedtime. What changed:    how much to take  when to take this  reasons to take this        Discharge Instructions: Please refer to Patient Instructions section of EMR for full details.  Patient was counseled important signs and symptoms that should prompt return to medical care, changes in medications, dietary instructions, activity restrictions, and follow up appointments.   Follow-Up Appointments: Follow-up Information    [place] Bing, DO Follow up on 01/29/2019.   Specialty:  Family Medicine Why:  Your appointment is at 2:30pm.  Please show up at least 15 minutes early. Contact information: [place] [address] [phone]           [name], MD 01/25/2019, 9:15 AM PGY-1, [place]

## 2019-01-25 NOTE — Discharge Instructions (Signed)
[name] was hospitalized because of her low potassium level.  During her hospitalization, were able to bring her potassium to back within normal limits.  While she was here we also relieved her constipation.  She did not suffer any serious consequences from her low potassium but this is 7 that can be serious.  It will be important to follow closely with her outpatient doctor to ensure good control of her potassium in the future.   Hypokalemia Hypokalemia means that the amount of potassium in the blood is lower than normal.Potassium is a chemical that helps regulate the amount of fluid in the body (electrolyte). It also stimulates muscle tightening (contraction) and helps nerves work properly.Normally, most of the bodys potassium is inside of cells, and only a very small amount is in the blood. Because the amount in the blood is so small, minor changes to potassium levels in the blood can be life-threatening. What are the causes? This condition may be caused by:  Antibiotic medicine.  Diarrhea or vomiting. Taking too much of a medicine that helps you have a bowel movement (laxative) can cause diarrhea and lead to hypokalemia.  Chronic kidney disease (CKD).  Medicines that help the body get rid of excess fluid (diuretics).  Eating disorders, such as bulimia.  Low magnesium levels in the body.  Sweating a lot. What are the signs or symptoms? Symptoms of this condition include:  Weakness.  Constipation.  Fatigue.  Muscle cramps.  Mental confusion.  Skipped heartbeats or irregular heartbeat (palpitations).  Tingling or numbness. How is this diagnosed? This condition is diagnosed with a blood test. How is this treated? Hypokalemia can be treated by taking potassium supplements by mouth or adjusting the medicines that you take. Treatment may also include eating more foods that contain a lot of potassium. If your potassium level is very low, you may need to get potassium through  an IV tube in one of your veins and be monitored in the hospital. Follow these instructions at home:   Take over-the-counter and prescription medicines only as told by your health care provider. This includes vitamins and supplements.  Eat a healthy diet. A healthy diet includes fresh fruits and vegetables, whole grains, healthy fats, and lean proteins.  If instructed, eat more foods that contain a lot of potassium, such as: ? Nuts, such as peanuts and pistachios. ? Seeds, such as sunflower seeds and pumpkin seeds. ? Peas, lentils, and lima beans. ? Whole grain and bran cereals and breads. ? Fresh fruits and vegetables, such as apricots, avocado, bananas, cantaloupe, kiwi, oranges, tomatoes, asparagus, and potatoes. ? Orange juice. ? Tomato juice. ? Red meats. ? Yogurt.  Keep all follow-up visits as told by your health care provider. This is important. Contact a health care provider if:  You have weakness that gets worse.  You feel your heart pounding or racing.  You vomit.  You have diarrhea.  You have diabetes (diabetes mellitus) and you have trouble keeping your blood sugar (glucose) in your target range. Get help right away if:  You have chest pain.  You have shortness of breath.  You have vomiting or diarrhea that lasts for more than 2 days.  You faint. This information is not intended to replace advice given to you by your health care provider. Make sure you discuss any questions you have with your health care provider. Document Released: 10/30/2005 Document Revised: 06/17/2016 Document Reviewed: 06/17/2016 Elsevier Interactive Patient Education  2019 [employer].

## 2019-01-29 ENCOUNTER — Other Ambulatory Visit: Payer: Self-pay

## 2019-01-29 ENCOUNTER — Ambulatory Visit (INDEPENDENT_AMBULATORY_CARE_PROVIDER_SITE_OTHER): Payer: Medicare Other | Admitting: Family Medicine

## 2019-01-29 VITALS — BP 131/75 | HR 77 | Temp 97.4°F

## 2019-01-29 DIAGNOSIS — E876 Hypokalemia: Secondary | ICD-10-CM

## 2019-01-29 DIAGNOSIS — I10 Essential (primary) hypertension: Secondary | ICD-10-CM | POA: Diagnosis not present

## 2019-01-29 DIAGNOSIS — K5900 Constipation, unspecified: Secondary | ICD-10-CM | POA: Diagnosis not present

## 2019-01-29 NOTE — Patient Instructions (Signed)
Thank you for coming in to see [place] today. Please see below to review our plan for today's visit.  1.  It sounds like you are constipated.  Continue taking MiraLAX daily and take the senna S as needed in order to achieve a bowel movement daily. 2.  I will call you if your potassium is low and inform you of what to do with your level in the next 24-48 hours. 3.  Take the lower dose of losartan at 50 mg before bedtime.  Please call the clinic at [phone] if your symptoms worsen or you have any concerns. It was our pleasure to serve you.  [name], [place], PGY-3

## 2019-01-29 NOTE — Progress Notes (Signed)
   Subjective   Patient ID: [name]    DOB: [birth year], 85 y.o. female   MRN: [id number]  CC: "hospital follow-up"  HPI: [name] is a 85 y.o. female who presents to clinic today for the following:  Constipation: [name] presented to the ED with 1 week of constipation with complaints of difficulty urinating during the week.  There was no significant stool burden on KUB however she did have significant improvement of abdominal discomfort and improved urination with resolution of constipation during hospitalization.  She has been adherent to her Senokot-S and MiraLAX.  She has a bowel movement every other day.  She denies any melena or hematochezia.  Hypokalemia: Incidental finding of 2.7 on admission.  Was given repletion and stable prior to discharge.  Here today for recheck.  Does take losartan for blood pressure.  Did not have significant GI losses based on history during evaluation and decision was made not to pursue extensive work-up giving her advanced age.  She denies palpitations, shortness of breath or chest pain, syncope, confusion or motor weakness.  Hypertension: There was confusion during hospitalization about losartan dose.  Patient was instructed to take the lower 50 mg dose at discharge.  She has been adherent.  ROS: see HPI for pertinent.  [place]: HTN, PAD, h/o vertebrobasilar artery syndrome, HFpEF, hiatal hernia, GERD, tobacco use disorder, h/o b/l-breast cancer, h/o colon cancer, severe protein-calorie malnutrition, gout, IDA.Surgical history tot knee, EGD for esophogeal tear, b/l mastectomy, cardiac cath 2007 severe 3-vessel disease. Family history heart disease. Smoking status reviewed. Medications reviewed.  Objective   BP 131/75   Pulse 77   Temp (!) 97.4 F (36.3 C) (Oral)   SpO2 99%  Vitals and nursing note reviewed.  General: frail elderly female, well nourished, well developed, NAD with non-toxic appearance HEENT: normocephalic, atraumatic, moist  mucous membranes Cardiovascular: regular rate and rhythm without murmurs, rubs, or gallops Lungs: clear to auscultation bilaterally with normal work of breathing Abdomen: soft, non-tender, non-distended, normoactive bowel sounds Skin: warm, dry, no rashes or lesions, cap refill < 2 seconds Extremities: warm and well perfused, normal tone, no edema  Assessment & Plan   Primary hypertension Chronic.  Well-controlled. - Instructed to continue losartan 50 mg and switch to administering prior to bedtime - See plan for hypokalemia  Hypokalemia Here for recheck.  Uncertain source.  No history of volume loss. - Checking BMET and will consider potassium supplement versus spironolactone based on results  Constipation Resolved.  No red flags.  Likely functional constipation given advanced age and sedentary state. - Advised to continue MiraLAX scheduled daily and take senna-S as needed to achieve daily bowel movements  Orders Placed This Encounter  Procedures  . Basic Metabolic Panel   No orders of the defined types were placed in this encounter.   [name], [place], PGY-3 01/29/2019, 4:58 PM

## 2019-01-29 NOTE — Assessment & Plan Note (Signed)
Resolved.  No red flags.  Likely functional constipation given advanced age and sedentary state. - Advised to continue MiraLAX scheduled daily and take senna-S as needed to achieve daily bowel movements

## 2019-01-29 NOTE — Assessment & Plan Note (Signed)
Here for recheck.  Uncertain source.  No history of volume loss. - Checking BMET and will consider potassium supplement versus spironolactone based on results

## 2019-01-29 NOTE — Assessment & Plan Note (Addendum)
Chronic.  Well-controlled. - Instructed to continue losartan 50 mg and switch to administering prior to bedtime - See plan for hypokalemia

## 2019-01-30 ENCOUNTER — Telehealth: Payer: Self-pay | Admitting: Family Medicine

## 2019-01-30 ENCOUNTER — Encounter: Payer: Self-pay | Admitting: Family Medicine

## 2019-01-30 LAB — BASIC METABOLIC PANEL
BUN/Creatinine Ratio: 11 — ABNORMAL LOW (ref 12–28)
BUN: 11 mg/dL (ref 10–36)
CO2: 22 mmol/L (ref 20–29)
Calcium: 10.8 mg/dL — ABNORMAL HIGH (ref 8.7–10.3)
Chloride: 105 mmol/L (ref 96–106)
Creatinine, Ser: 0.97 mg/dL (ref 0.57–1.00)
GFR calc Af Amer: 58 mL/min/{1.73_m2} — ABNORMAL LOW (ref >59)
GFR, EST NON AFRICAN AMERICAN: 51 mL/min/{1.73_m2} — AB (ref >59)
Glucose: 175 mg/dL — ABNORMAL HIGH (ref 65–99)
Potassium: 3.6 mmol/L (ref 3.5–5.2)
Sodium: 141 mmol/L (ref 134–144)

## 2019-01-30 NOTE — Telephone Encounter (Signed)
Patient daughter aware.  Danley Danker, RN [facility] RN)

## 2019-01-30 NOTE — Telephone Encounter (Signed)
No answer, left HIPPA compliant VM. Patient's labs from office visit 01/29/2019 look good. No need to change medications or add potassium.   [name], [place], PGY-3

## 2019-01-31 ENCOUNTER — Encounter: Payer: Self-pay | Admitting: Podiatry

## 2019-01-31 NOTE — Progress Notes (Signed)
Subjective: Patient presents today with diabetes and cc of painful, discolored, thick toenails which interfere with daily activities. Pain is aggravated when wearing enclosed shoe gear. Pain is getting progressively worse and relieved with periodic professional debridement.  [place] Bing, DO is her PCP. Last visit was 12/26/2018.   Current Outpatient Medications:  .  acetaminophen (TYLENOL) 500 MG tablet, Take 500 mg by mouth every 6 (six) hours as needed for moderate pain. , Disp: , Rfl:  .  albuterol (PROVENTIL HFA;VENTOLIN HFA) 108 (90 BASE) MCG/ACT inhaler, Inhale 2 puffs into the lungs every 6 (six) hours as needed. For shortness of breath, Disp: 1 Inhaler, Rfl: 6 .  Amino Acids-Protein Hydrolys (FEEDING SUPPLEMENT, PRO-STAT SUGAR FREE 64,) LIQD, Take 30 mLs by mouth 2 (two) times daily with a meal. ensure, Disp: 887 mL, Rfl: 0 .  aspirin EC 325 MG tablet, Take 1 tablet (325 mg total) by mouth daily., Disp: 90 tablet, Rfl: 3 .  carvedilol (COREG) 12.5 MG tablet, TAKE 1 TABLET BY MOUTH TWICE A DAY WITH A MEAL (Patient taking differently: Take 12.5 mg by mouth 2 (two) times daily with a meal. ), Disp: 90 tablet, Rfl: 3 .  diclofenac sodium (VOLTAREN) 1 % GEL, Apply 2 g topically 4 (four) times daily. (Patient not taking: Reported on 11/27/2018), Disp: 1 Tube, Rfl: 0 .  fluticasone (FLONASE) 50 MCG/ACT nasal spray, Place 1 spray into both nostrils daily. (Patient not taking: Reported on 01/24/2019), Disp: 1 g, Rfl: 0 .  loratadine (CLARITIN) 10 MG tablet, TAKE 1/2 TABLET BY MOUTH DAILY (Patient taking differently: Take 5 mg by mouth daily as needed. ), Disp: 15 tablet, Rfl: 0 .  losartan (COZAAR) 50 MG tablet, Take 1 tablet (50 mg total) by mouth daily. (Patient taking differently: Take 50-100 mg by mouth 2 (two) times daily. Take 100mg  in the morning and 50mg  in the evening), Disp: 90 tablet, Rfl: 3 .  pantoprazole (PROTONIX) 40 MG tablet, TAKE 1 TABLET BY MOUTH EVERY DAY (Patient taking  differently: Take 40 mg by mouth daily. ), Disp: 90 tablet, Rfl: 3 .  polyethylene glycol (MIRALAX / GLYCOLAX) packet, Take 17 g by mouth daily., Disp: 14 each, Rfl: 0 .  ranitidine (ZANTAC) 150 MG tablet, TAKE 1 TABLET BY MOUTH TWICE A DAY, Disp: 180 tablet, Rfl: 1 .  rosuvastatin (CRESTOR) 20 MG tablet, TAKE 1 TABLET BY MOUTH EVERYDAY AT BEDTIME, Disp: 90 tablet, Rfl: 2 .  senna-docusate (SENOKOT-S) 8.6-50 MG tablet, Take 2 tablets by mouth at bedtime. (Patient taking differently: Take 1 tablet by mouth at bedtime as needed for mild constipation. ), Disp: 60 tablet, Rfl: 3   Allergies  Allergen Reactions  . Tape Other (See Comments)    Skin is very thin and will tear and bruise easily!!   . Lisinopril Cough     Objective:  Vascular Examination: Capillary refill time delayed x 10 digits.  Dorsalis pedis pulses faintly palpable b/l.  Posterior tibial pulses faintly palpable b/l.  Digital hair absent x 10 digits.  Skin temperature gradient WNL  b/l  Dermatological Examination: Skin thin, shiny and atrophic b/l.  Toenails 1-5 b/l discolored, thick, dystrophic with subungual debris and pain with palpation to nailbeds due to thickness of nails.  Musculoskeletal: Muscle strength 5/5 to all LE muscle groups  Neurological: Sensation intact with 10 gram monofilament.  Vibratory sensation intact.  Assessment: 1. Painful onychomycosis toenails 1-5 b/l 2. NIDDM with Peripheral arterial disease  Plan: 1. Toenails 1-5 b/l were debrided  in length and girth without iatrogenic bleeding. 2. Patient to continue soft, supportive shoe gear daily. 3. Patient to report any pedal injuries to medical professional immediately. Follow up 3 months. 4. Patient/POA to call should there be a concern in the interim.

## 2019-02-05 ENCOUNTER — Other Ambulatory Visit: Payer: Self-pay | Admitting: Family Medicine

## 2019-02-05 DIAGNOSIS — I10 Essential (primary) hypertension: Secondary | ICD-10-CM

## 2019-02-10 ENCOUNTER — Other Ambulatory Visit: Payer: Self-pay | Admitting: Student in an Organized Health Care Education/Training Program

## 2019-02-13 ENCOUNTER — Other Ambulatory Visit: Payer: Self-pay

## 2019-02-14 MED ORDER — AMLODIPINE BESYLATE 5 MG PO TABS: 5.0000 mg | ORAL_TABLET | Freq: Every day | ORAL | 3 refills | Status: DC

## 2019-02-19 ENCOUNTER — Ambulatory Visit: Payer: Self-pay | Admitting: Neurology

## 2019-03-03 DIAGNOSIS — I10 Essential (primary) hypertension: Secondary | ICD-10-CM | POA: Diagnosis not present

## 2019-03-03 DIAGNOSIS — Z7902 Long term (current) use of antithrombotics/antiplatelets: Secondary | ICD-10-CM | POA: Diagnosis not present

## 2019-03-03 DIAGNOSIS — F341 Dysthymic disorder: Secondary | ICD-10-CM | POA: Diagnosis not present

## 2019-03-03 DIAGNOSIS — I509 Heart failure, unspecified: Secondary | ICD-10-CM | POA: Diagnosis not present

## 2019-03-03 DIAGNOSIS — E782 Mixed hyperlipidemia: Secondary | ICD-10-CM | POA: Diagnosis not present

## 2019-03-08 ENCOUNTER — Other Ambulatory Visit: Payer: Self-pay | Admitting: Student in an Organized Health Care Education/Training Program

## 2019-03-14 ENCOUNTER — Emergency Department (HOSPITAL_COMMUNITY): Payer: Medicare Other

## 2019-03-14 ENCOUNTER — Encounter (HOSPITAL_COMMUNITY): Payer: Self-pay | Admitting: Emergency Medicine

## 2019-03-14 ENCOUNTER — Other Ambulatory Visit: Payer: Self-pay

## 2019-03-14 ENCOUNTER — Emergency Department (HOSPITAL_COMMUNITY)
Admission: EM | Admit: 2019-03-14 | Discharge: 2019-03-14 | Disposition: A | Discharge status: Home / Self Care | Payer: Medicare Other | Attending: Emergency Medicine | Admitting: Emergency Medicine

## 2019-03-14 DIAGNOSIS — I959 Hypotension, unspecified: Secondary | ICD-10-CM | POA: Diagnosis not present

## 2019-03-14 DIAGNOSIS — M79662 Pain in left lower leg: Secondary | ICD-10-CM | POA: Diagnosis not present

## 2019-03-14 DIAGNOSIS — Y939 Activity, unspecified: Secondary | ICD-10-CM | POA: Diagnosis not present

## 2019-03-14 DIAGNOSIS — Z853 Personal history of malignant neoplasm of breast: Secondary | ICD-10-CM | POA: Diagnosis not present

## 2019-03-14 DIAGNOSIS — S82092A Other fracture of left patella, initial encounter for closed fracture: Secondary | ICD-10-CM | POA: Insufficient documentation

## 2019-03-14 DIAGNOSIS — F0390 Unspecified dementia without behavioral disturbance: Secondary | ICD-10-CM | POA: Diagnosis not present

## 2019-03-14 DIAGNOSIS — E119 Type 2 diabetes mellitus without complications: Secondary | ICD-10-CM | POA: Diagnosis not present

## 2019-03-14 DIAGNOSIS — R52 Pain, unspecified: Secondary | ICD-10-CM | POA: Diagnosis not present

## 2019-03-14 DIAGNOSIS — I5022 Chronic systolic (congestive) heart failure: Secondary | ICD-10-CM | POA: Diagnosis not present

## 2019-03-14 DIAGNOSIS — E1165 Type 2 diabetes mellitus with hyperglycemia: Secondary | ICD-10-CM | POA: Diagnosis not present

## 2019-03-14 DIAGNOSIS — Y92013 Bedroom of single-family (private) house as the place of occurrence of the external cause: Secondary | ICD-10-CM | POA: Diagnosis not present

## 2019-03-14 DIAGNOSIS — Z85038 Personal history of other malignant neoplasm of large intestine: Secondary | ICD-10-CM | POA: Diagnosis not present

## 2019-03-14 DIAGNOSIS — I2510 Atherosclerotic heart disease of native coronary artery without angina pectoris: Secondary | ICD-10-CM | POA: Insufficient documentation

## 2019-03-14 DIAGNOSIS — W04XXXA Fall while being carried or supported by other persons, initial encounter: Secondary | ICD-10-CM | POA: Diagnosis not present

## 2019-03-14 DIAGNOSIS — R51 Headache: Secondary | ICD-10-CM | POA: Insufficient documentation

## 2019-03-14 DIAGNOSIS — S0990XA Unspecified injury of head, initial encounter: Secondary | ICD-10-CM | POA: Diagnosis not present

## 2019-03-14 DIAGNOSIS — Y999 Unspecified external cause status: Secondary | ICD-10-CM | POA: Insufficient documentation

## 2019-03-14 DIAGNOSIS — I10 Essential (primary) hypertension: Secondary | ICD-10-CM | POA: Insufficient documentation

## 2019-03-14 DIAGNOSIS — Z79899 Other long term (current) drug therapy: Secondary | ICD-10-CM | POA: Diagnosis not present

## 2019-03-14 DIAGNOSIS — S3993XA Unspecified injury of pelvis, initial encounter: Secondary | ICD-10-CM | POA: Diagnosis not present

## 2019-03-14 DIAGNOSIS — M25572 Pain in left ankle and joints of left foot: Secondary | ICD-10-CM | POA: Insufficient documentation

## 2019-03-14 DIAGNOSIS — S82002A Unspecified fracture of left patella, initial encounter for closed fracture: Secondary | ICD-10-CM | POA: Diagnosis not present

## 2019-03-14 DIAGNOSIS — M25562 Pain in left knee: Secondary | ICD-10-CM | POA: Diagnosis not present

## 2019-03-14 DIAGNOSIS — M7989 Other specified soft tissue disorders: Secondary | ICD-10-CM | POA: Diagnosis not present

## 2019-03-14 DIAGNOSIS — S8992XA Unspecified injury of left lower leg, initial encounter: Secondary | ICD-10-CM | POA: Diagnosis not present

## 2019-03-14 DIAGNOSIS — S99912A Unspecified injury of left ankle, initial encounter: Secondary | ICD-10-CM | POA: Diagnosis not present

## 2019-03-14 MED ORDER — ONDANSETRON 4 MG PO TBDP
ORAL_TABLET | ORAL | Status: AC
  Filled 2019-03-14: qty 1

## 2019-03-14 NOTE — ED Provider Notes (Signed)
[facility] EMERGENCY DEPARTMENT Provider Note   CSN: [id number] Arrival date & time: 03/14/19  1347    History   Chief Complaint Chief Complaint  Patient presents with   Fall   Knee Pain    HPI [name] is a 85 y.o. female.     HPI 85 year old woman with past medical history of dementia presents to the emergency department for evaluation after a fall today where her caretaker was unable to successfully get her to the bed.  Has pain in her left knee.  Also may have struck her head but she is unsure.  Also has pain in her left ankle.  Denies pain elsewhere.  Denies any pain in her hips or her back.  No recent illnesses or injuries otherwise.  Has not had any other recent falls.  She does not take blood thinners.  Lives with her daughter who helps her with her activities of daily living. Past Medical History:  Diagnosis Date   Allergy    Anemia    Iron def anemia   Arthritis    Asthma    Breast cancer ([place]) 10/2010   Invasive Ductal Carcinoma, s/p bilateral mastectomy, now on Tamoxifen. Followed by Dr Jamse Arn.    Breast cancer ([place]) 2011   s/p Bilateral masectomy   CAD (coronary artery disease)    Carotid artery aneurysm ([place]) 08/2010   right ICA, 5 x 37mm   CHF (congestive heart failure) ([place])    Systolic    Colon cancer ([place])    Colon cancer ([place]) 12/11/2013   DDD (degenerative disc disease), cervical    Diabetes mellitus    Diverticulosis    GERD (gastroesophageal reflux disease)    H/O: GI bleed    Hiatal hernia    History of colon cancer    Hypercalcemia 06/09/2014   Hyperlipidemia    Hypertension    Myocardial infarction ([place])    Neuropathy    Pneumonia 03/2012   Pressure injury of heel, stage 2 ([place]) 08/31/2016   Shortness of breath     Patient Active Problem List   Diagnosis Date Noted   Constipation 01/29/2019   Chronic breast pain 12/26/2018   Pain in gums 12/26/2018   History of deep vein  thrombosis 10/17/2018   Severe protein-calorie malnutrition Altamease [place]: less than 60% of standard weight) ([place]) 10/17/2018   Disorder associated with well-controlled type 2 diabetes mellitus ([place]) 10/17/2018   History of pulmonary embolus (PE) 10/17/2018   Hypomagnesemia 08/27/2018   Left ventricular hypertrophy by electrocardiogram 08/27/2018   Atypical chest pain 08/26/2018   Advanced nonexudative age-related macular degeneration of both eyes without subfoveal involvement 07/30/2018   Altered mental status 06/06/2018   Microcytic anemia    TIA (transient ischemic attack) 06/03/2018   Mobility impaired 04/26/2018   Protein-calorie malnutrition, severe 02/06/2017   Urinary tract infection without hematuria 02/03/2017   Hypokalemia 10/13/2016   Pressure injury of heel, stage 2 ([place]) 08/31/2016   Pulmonary embolus ([place]) 08/30/2016   Dementia without behavioral disturbance ([place]) 06/01/2016   Depression 01/20/2016   Suicidal ideation 01/19/2016   Prolapse of female pelvic organs 12/28/2015   Leg weakness, bilateral 12/08/2015   Weakness of right lower extremity 11/25/2015   Dry eyes 10/28/2015   (HFpEF) heart failure with preserved ejection fraction ([place])    Gout 05/14/2015   Delirium secondary to multiple medical problems 04/15/2015   Primary hypertension    Seasonal allergies 02/10/2015   Stroke-like symptoms    Stroke-like episode (  [place]) 01/25/2015   Syncope and collapse 01/25/2015   Dizziness 09/19/2014   Anemia of chronic illness 08/19/2014   Acute superficial venous thrombosis of right lower extremity 08/19/2014   Hypercalcemia 06/09/2014   Breast cancer, left breast ([place]) 06/09/2014   Breast cancer, right breast ([place]) 06/09/2014   Edema of left lower extremity 03/30/2014   PAD (peripheral artery disease) ([place]) 03/30/2014   History of colon cancer 12/11/2013   History of fall 05/15/2013   Stricture and stenosis of esophagus 10/27/2012     Thoracic back pain 05/24/2012   Productive cough 04/30/2012   Tobacco use disorder 06/02/2011   Post-mastectomy pain 05/02/2011   Hiatal hernia 03/22/2011   Vertebrobasilar artery syndrome 08/22/2010   IDA (iron deficiency anemia) 04/29/2007   Controlled type 2 diabetes mellitus with diabetic neuropathy, without long-term current use of insulin ([place]) 01/10/2007   Coronary atherosclerosis 01/10/2007   Asthma 01/10/2007   Reflux esophagitis 01/10/2007   Osteoarthritis, multiple sites 01/10/2007    Past Surgical History:  Procedure Laterality Date   Breast masectomy  10/2010   Bilateral masectomy by Dr Lucia Gaskins s/p invasive ductal carcinoma.   BREAST SURGERY     Cardiac  Catherization  2007   Severe 3-vessel disease.  EF 20-25%.   ESOPHAGOGASTRODUODENOSCOPY  2001   Esophageal Tear   ESOPHAGOGASTRODUODENOSCOPY  10/27/2012   Procedure: ESOPHAGOGASTRODUODENOSCOPY (EGD);  Surgeon: Irene Shipper, MD;  Location: [facility] ENDOSCOPY;  Service: Endoscopy;  Laterality: N/A;  pat   JOINT REPLACEMENT Right 2001   Knee   TOTAL KNEE ARTHROPLASTY  2001     OB History   No obstetric history on file.      Home Medications    Prior to Admission medications   Medication Sig Start Date End Date Taking? Authorizing Provider  acetaminophen (TYLENOL) 500 MG tablet Take 500 mg by mouth every 6 (six) hours as needed for moderate pain.     [provider]  albuterol (PROVENTIL HFA;VENTOLIN HFA) 108 (90 BASE) MCG/ACT inhaler Inhale 2 puffs into the lungs every 6 (six) hours as needed. For shortness of breath 01/18/15   [name], MD  Amino Acids-Protein Hydrolys (FEEDING SUPPLEMENT, PRO-STAT SUGAR FREE 64,) LIQD Take 30 mLs by mouth 2 (two) times daily with a meal. ensure 01/25/19   [name], MD  amLODipine (NORVASC) 5 MG tablet Take 1 tablet (5 mg total) by mouth daily. 02/14/19   [place] Bing, DO  aspirin EC 325 MG tablet Take 1 tablet (325 mg total) by mouth daily.  07/05/18   [place] Bing, DO  carvedilol (COREG) 12.5 MG tablet Take 1 tablet (12.5 mg total) by mouth 2 (two) times daily with a meal. 02/06/19   [place] Bing, DO  diclofenac sodium (VOLTAREN) 1 % GEL Apply 2 g topically 4 (four) times daily. Patient not taking: Reported on 11/27/2018 08/29/18   Meccariello, Bernita Raisin, DO  fluticasone (FLONASE) 50 MCG/ACT nasal spray PLACE 1 SPRAY INTO BOTH NOSTRILS DAILY. 03/11/19 03/10/20  [place] Bing, DO  loratadine (CLARITIN) 10 MG tablet TAKE 1/2 TABLET BY MOUTH DAILY Patient taking differently: Take 5 mg by mouth daily as needed.  12/08/15   [name], MD  losartan (COZAAR) 50 MG tablet Take 1 tablet (50 mg total) by mouth daily. Patient taking differently: Take 50-100 mg by mouth 2 (two) times daily. Take 100mg  in the morning and 50mg  in the evening 07/23/18   [place] Bing, DO  pantoprazole (PROTONIX) 40 MG tablet TAKE  1 TABLET BY MOUTH EVERY DAY Patient taking differently: Take 40 mg by mouth daily.  10/14/18   [place] Bing, DO  polyethylene glycol (MIRALAX / GLYCOLAX) packet Take 17 g by mouth daily. 01/25/19   [name], MD  ranitidine (ZANTAC) 150 MG tablet TAKE 1 TABLET BY MOUTH TWICE A DAY 07/29/18   [place] Bing, DO  rosuvastatin (CRESTOR) 20 MG tablet TAKE 1 TABLET BY MOUTH EVERYDAY AT BEDTIME 09/09/18   [place] Bing, DO  senna-docusate (SENOKOT-S) 8.6-50 MG tablet Take 2 tablets by mouth at bedtime. Patient taking differently: Take 1 tablet by mouth at bedtime as needed for mild constipation.  04/23/17   Bac[place]upo, Dionne Bucy, MD  cetirizine (ZYRTEC) 5 MG tablet Take 1 tablet (5 mg total) by mouth daily. Take at night time as it can cause some drowsiness 05/24/12 07/09/12  Losq, Burnell Blanks, MD    Family History Family History  Problem Relation Age of Onset   Sickle cell anemia Other    Heart disease Mother    Heart disease Father    Cancer Daughter 42       breast ca   Hypertension Daughter    Cancer  Daughter 12       breast ca   Hypertension Daughter    Heart disease Son        Poor circulation-Left Leg   Diabetes Daughter    Hypertension Daughter    Hyperlipidemia Daughter        Poor circulation- Toe amputation    Social History Social History   Tobacco Use   Smoking status: Never Smoker   Smokeless tobacco: Current User    Types: Snuff  Substance Use Topics   Alcohol use: No   Drug use: No     Allergies   Tape and Lisinopril   Review of Systems Review of Systems  Constitutional: Negative for chills and fever.  HENT: Negative for ear pain and sore throat.   Eyes: Negative for pain and visual disturbance.  Respiratory: Negative for cough and shortness of breath.   Cardiovascular: Negative for chest pain and palpitations.  Gastrointestinal: Negative for abdominal pain and vomiting.  Endocrine: Negative for polyphagia and polyuria.  Genitourinary: Negative for dysuria and hematuria.  Musculoskeletal: Positive for arthralgias. Negative for back pain, neck pain and neck stiffness.  Skin: Negative for color change and rash.  Allergic/Immunologic: Negative for immunocompromised state.  Neurological: Negative for seizures and syncope.  All other systems reviewed and are negative.    Physical Exam Updated Vital Signs BP (!) 135/51    Pulse (!) 58    Temp 98.1 F (36.7 C) (Oral)    Resp 15    Wt 50.4 kg    SpO2 99%    BMI 22.44 kg/m   Physical Exam Vitals signs and nursing note reviewed.  Constitutional:      Appearance: She is well-developed. She is not ill-appearing or diaphoretic.  HENT:     Head: Normocephalic and atraumatic.  Eyes:     Conjunctiva/sclera: Conjunctivae normal.  Neck:     Musculoskeletal: Neck supple.  Cardiovascular:     Rate and Rhythm: Normal rate and regular rhythm.     Heart sounds: No murmur.  Pulmonary:     Effort: Pulmonary effort is normal. No respiratory distress.     Breath sounds: Normal breath sounds.    Abdominal:     Palpations: Abdomen is soft.     Tenderness: There is no abdominal tenderness.  Musculoskeletal:        General: Tenderness present. No deformity.     Right lower leg: No edema.     Left lower leg: No edema.     Comments: Tenderness to the anterior medial patella.  Minimal tenderness to the lateral aspect of the left ankle.  Moderate swelling around the left ankle compared to the right.  Logrolling of bilateral lower extremities does not elicit hip pain.  No pain with pelvic compression.  Entirety of the spine does not have any tenderness to palpation.  Skin:    General: Skin is warm and dry.  Neurological:     General: No focal deficit present.     Mental Status: She is alert and oriented to person, place, and time.     Comments: Cranial nerves II through XII bilaterally intact.  5 out of 5 strength in bilateral upper and lower extremities bilaterally.  No loss of light touch sensation in the body.  GCS 15.      ED Treatments / Results  Labs (all labs ordered are listed, but only abnormal results are displayed) Labs Reviewed - No data to display  EKG None  Radiology Dg Knee Complete 4 Views Left  Result Date: 03/14/2019 CLINICAL DATA:  Status post fall.  Pain. EXAM: LEFT KNEE - COMPLETE 4+ VIEW COMPARISON:  None. FINDINGS: Generalized osteopenia. Nondisplaced fracture of the medial aspect of the patella. No other fracture or dislocation. No aggressive osseous lesion. Chondrocalcinosis of the medial and lateral femorotibial compartments as can be seen with CPPD. Ossification along the periphery of medial femoral condyle likely reflecting old MCL injury. Mild medial femorotibial compartment joint space narrowing. Peripheral vascular atherosclerotic disease. IMPRESSION: Nondisplaced fracture of the medial aspect of the patella. Electronically Signed   By: Kathreen Devoid   On: 03/14/2019 17:54    Procedures Procedures (including critical care time)  Medications Ordered  in ED Medications  ondansetron (ZOFRAN-ODT) 4 MG disintegrating tablet (has no administration in time range)     Initial Impression / Assessment and Plan / ED Course  I have reviewed the triage vital signs and the nursing notes.  Pertinent labs & imaging results that were available during my care of the patient were reviewed by me and considered in my medical decision making (see chart for details).         85 year old woman presents emergency department after a fall as noted above.  CT scan of the head and the neck does not show any acute abnormalities.  Has a nondisplaced medial patella fracture of the left leg.  No patellar tendon disruption and still maintains integrity of the joint.  Placed in a immobilizing knee splint.  X-ray of the pelvis, ankle does not show fracture.  Patient is nonambulatory at baseline so I do not believe she needs emergent surgery at this time and can follow-up to orthopedics doctor for further management.  Patient and daughter in agreement with the plan.  Patient did not desire pain control at this time and declined Tylenol. Final Clinical Impressions(s) / ED Diagnoses   Final diagnoses:  Other closed fracture of left patella, initial encounter    ED Discharge Orders    None       Andee Poles, MD 03/14/19 [id number]    Quintella Reichert, MD 03/15/19 1114

## 2019-03-14 NOTE — ED Triage Notes (Signed)
Pt in with L knee pain since this am. Pt has some early dementia per family (which she lives with), but states she was dropped being moved from bed to wheelchair today. Is now c/o L knee pain, denies hitting head or having any other pain. No obvious deformity noted, able to move and extend leg

## 2019-03-14 NOTE — ED Notes (Signed)
Discharge instructions discussed with pt. And daughter [name]. Pt. And family have no questions at this time. Pt. D/c via wheelchair to car.

## 2019-03-14 NOTE — Discharge Instructions (Addendum)
Please do not put any weight on your left leg until seen by the specialist or your primary doctor.

## 2019-03-17 IMAGING — DX DG ABDOMEN ACUTE W/ 1V CHEST
3 series · 3 of 3 positions shown · non-contrast
Comparison: 11/27/2018 CXR, chest CT 08/30/2016

CLINICAL DATA: Generalized weakness and bloating

EXAM:
DG ABDOMEN ACUTE W/ 1V CHEST

[abdomen erect]
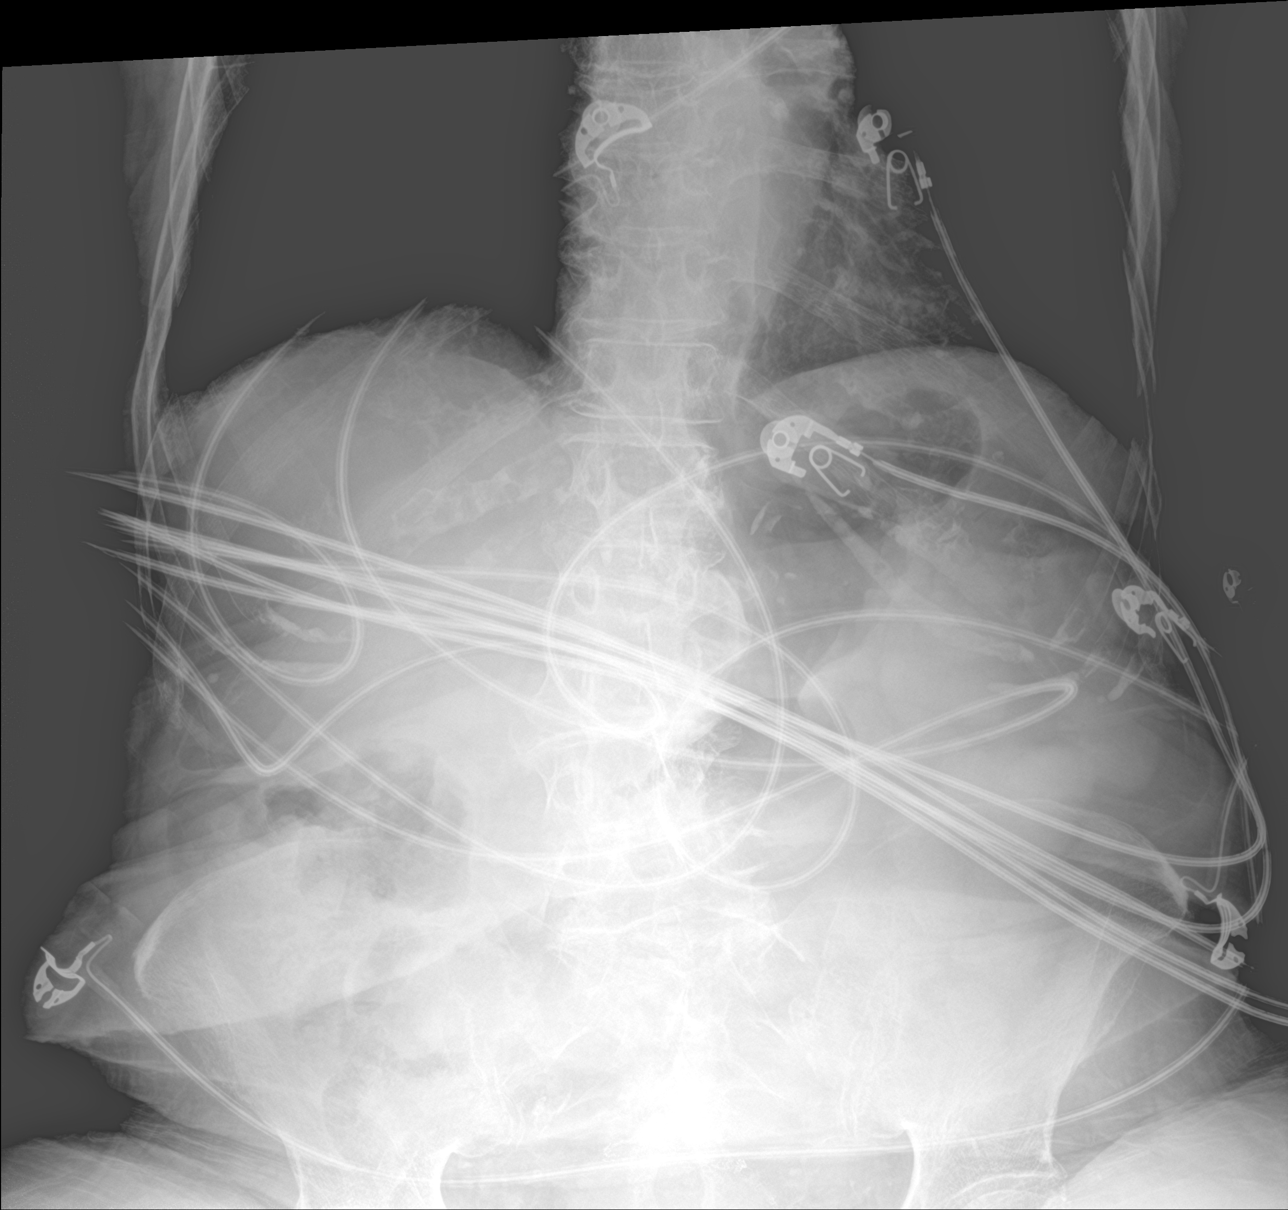

[abdomen supine]
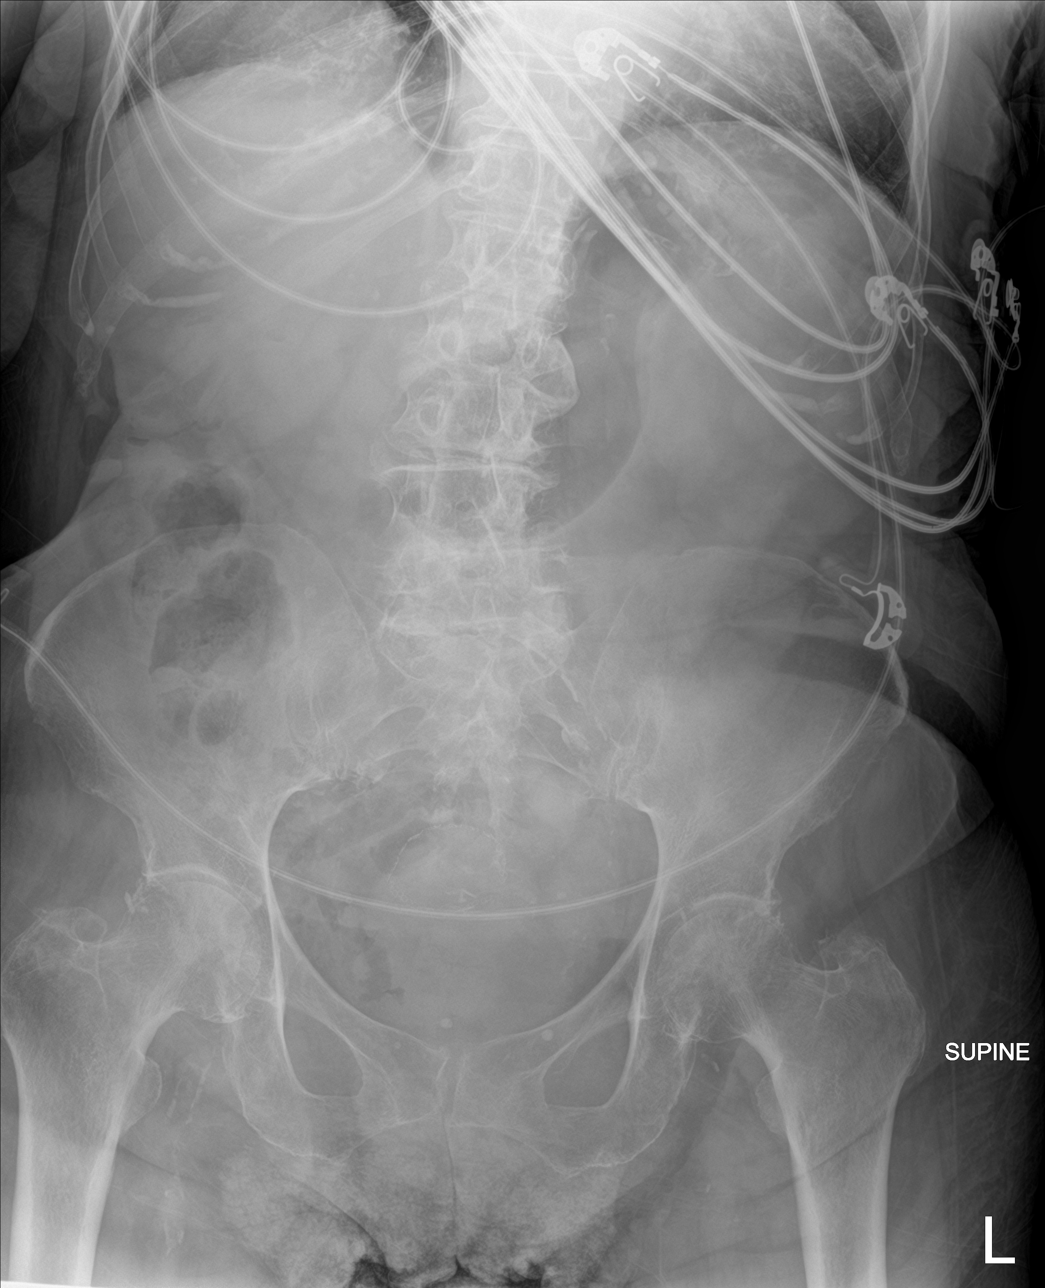

[chest ap]
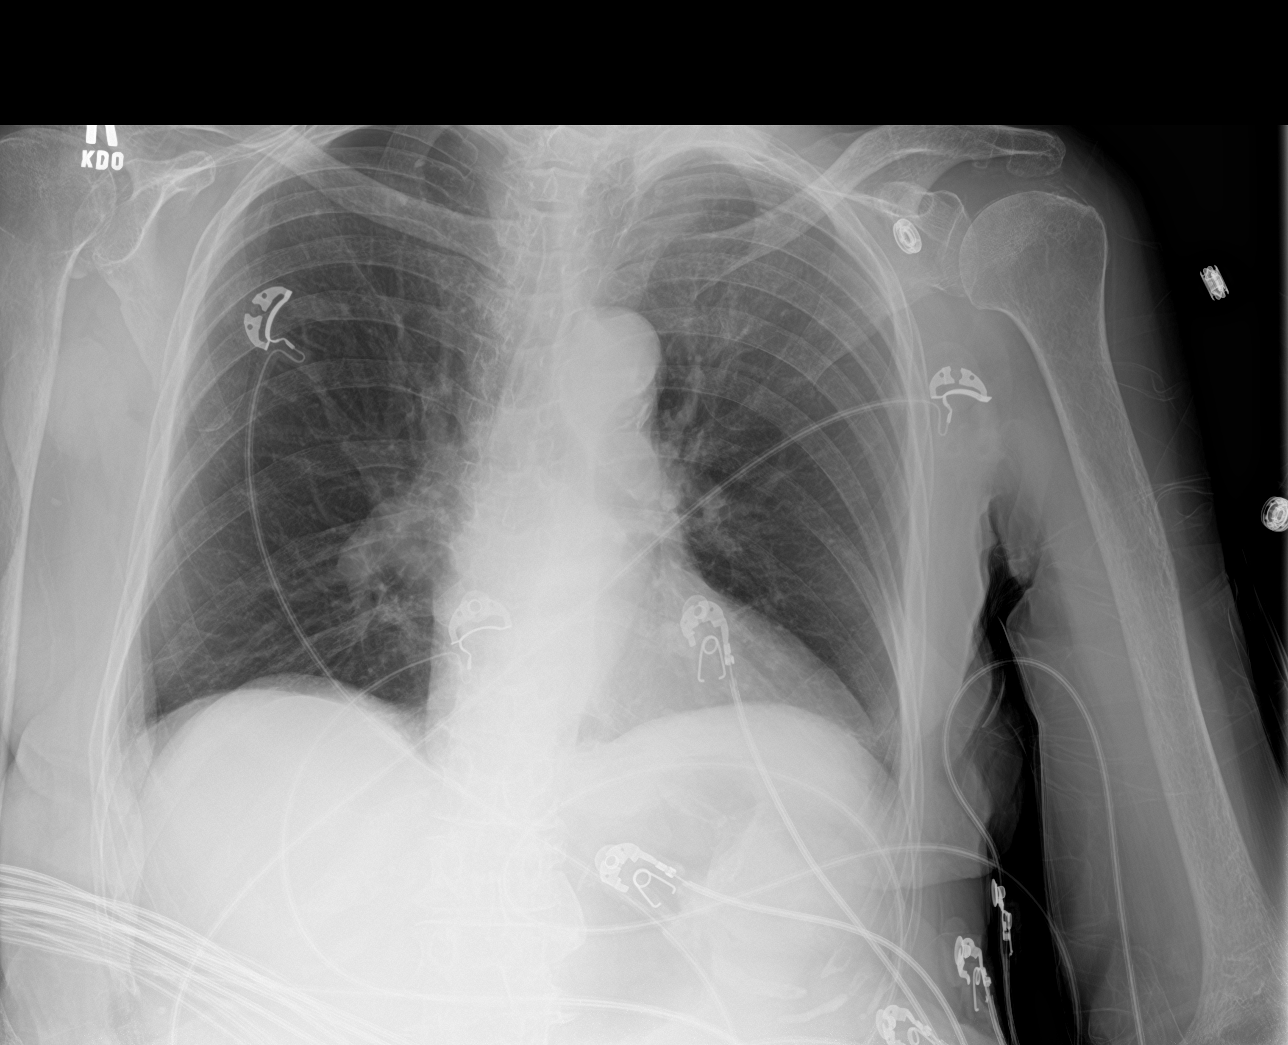

[3 of 3 positions shown; findings below may reference images not displayed]

FINDINGS: No acute pulmonary consolidation. Top-normal heart size with slight
uncoiling of the atherosclerotic aorta. Moderate chronic right hilar
prominence with pulmonary hyperinflation likely reflects chronic
pulmonary hypertension.

Nonobstructed bowel gas pattern. No free air, organomegaly nor
suspicious radiopaque calculi. Phleboliths are noted in pelvis.
Thoracolumbar spondylosis is noted with osteoarthritis of the hips.
No acute nor aggressive osseous lesions. Aortoiliac atherosclerosis.
Chain sutures are present in the mid abdomen. A 2 mm calcification
the right upper quadrant may reflect a renal stone, gallstone or
potentially vascular calcification among some considerations.
IMPRESSION: 1. Nonobstructed bowel gas pattern.
2. Mild pulmonary hyperinflation with pulmonary vascular prominence
likely reflecting chronic pulmonary hypertension.
3. 2 mm calcification in the right upper quadrant question renal
stone versus gallstone versus vascular calcification.
4. Thoracolumbar spondylosis.

## 2019-03-26 ENCOUNTER — Telehealth: Payer: Self-pay | Admitting: *Deleted

## 2019-03-26 ENCOUNTER — Other Ambulatory Visit (HOSPITAL_COMMUNITY)
Admission: RE | Admit: 2019-03-26 | Discharge: 2019-03-26 | Disposition: A | Discharge status: Home / Self Care | Payer: Medicare Other | Source: Ambulatory Visit | Attending: Internal Medicine | Admitting: Internal Medicine

## 2019-03-26 ENCOUNTER — Other Ambulatory Visit: Payer: Self-pay

## 2019-03-26 DIAGNOSIS — Z20828 Contact with and (suspected) exposure to other viral communicable diseases: Secondary | ICD-10-CM | POA: Insufficient documentation

## 2019-03-26 LAB — SARS CORONAVIRUS 2 BY RT PCR (HOSPITAL ORDER, PERFORMED IN ~~LOC~~ HOSPITAL LAB): SARS Coronavirus 2: NEGATIVE

## 2019-03-26 NOTE — Telephone Encounter (Signed)
Spoke with patient's daughter who will drive her to be tested today at 1:00p. Called to [facility] team. Informed that a [facility] team member would call within 24 hours with results.

## 2019-03-28 ENCOUNTER — Telehealth: Payer: Self-pay | Admitting: Family Medicine

## 2019-03-28 NOTE — Telephone Encounter (Signed)
Returned call to pt's legal guardian, her daughter, [name],  (pt has dementia), for lab results of the covid-19. Results are negative. She stated pt has no symptoms other than coughing but she chews tobacco. [name] voiced understanding of results.

## 2019-03-28 NOTE — Telephone Encounter (Signed)
Opened in error

## 2019-03-31 ENCOUNTER — Telehealth: Payer: Self-pay | Admitting: *Deleted

## 2019-03-31 NOTE — Telephone Encounter (Signed)
Received voicemail message on COVID line on Friday,03/28/19  asking for results for his mother's test results. Returned call to Delfino Lovett, at [phone], who states that his mother was in the hospital and had COVID testing done and he wanted to know the results. Legal guardian noted to be [name]. See telephone encounter from 03/28/19.Advised Richard that he should contact, his sister, [name] regarding information related to his mother because she is the legal guardian of the pt. Delfino Lovett is not listed a person to discuss pt's information. Understanding verbalized.

## 2019-04-01 DIAGNOSIS — I10 Essential (primary) hypertension: Secondary | ICD-10-CM | POA: Diagnosis not present

## 2019-04-01 DIAGNOSIS — I509 Heart failure, unspecified: Secondary | ICD-10-CM | POA: Diagnosis not present

## 2019-04-01 DIAGNOSIS — E782 Mixed hyperlipidemia: Secondary | ICD-10-CM | POA: Diagnosis not present

## 2019-04-01 DIAGNOSIS — F0390 Unspecified dementia without behavioral disturbance: Secondary | ICD-10-CM | POA: Diagnosis not present

## 2019-04-02 ENCOUNTER — Ambulatory Visit: Payer: Self-pay | Admitting: Neurology

## 2019-04-03 ENCOUNTER — Other Ambulatory Visit: Payer: Self-pay | Admitting: Family Medicine

## 2019-04-08 ENCOUNTER — Telehealth: Payer: Self-pay | Admitting: *Deleted

## 2019-04-08 NOTE — Telephone Encounter (Signed)
Daughter concerned that she may have dementia.  She is forgetting people and things and is also talking about things that did not happen or about people who are no longer living.  Appt tomorrow with Dr. Yisroel Ramming. Christen Bame, CMA

## 2019-04-09 ENCOUNTER — Other Ambulatory Visit: Payer: Self-pay

## 2019-04-09 ENCOUNTER — Ambulatory Visit (INDEPENDENT_AMBULATORY_CARE_PROVIDER_SITE_OTHER): Payer: Medicare Other | Admitting: Family Medicine

## 2019-04-09 ENCOUNTER — Encounter: Payer: Self-pay | Admitting: Family Medicine

## 2019-04-09 VITALS — BP 128/62 | HR 67

## 2019-04-09 DIAGNOSIS — Z114 Encounter for screening for human immunodeficiency virus [HIV]: Secondary | ICD-10-CM | POA: Diagnosis not present

## 2019-04-09 DIAGNOSIS — F0390 Unspecified dementia without behavioral disturbance: Secondary | ICD-10-CM

## 2019-04-09 DIAGNOSIS — R4182 Altered mental status, unspecified: Secondary | ICD-10-CM | POA: Diagnosis not present

## 2019-04-09 DIAGNOSIS — R410 Disorientation, unspecified: Secondary | ICD-10-CM

## 2019-04-09 LAB — POCT URINALYSIS DIP (MANUAL ENTRY)
Bilirubin, UA: NEGATIVE
Glucose, UA: NEGATIVE mg/dL
Ketones, POC UA: NEGATIVE mg/dL
Nitrite, UA: POSITIVE — AB
Protein Ur, POC: 100 mg/dL — AB
Spec Grav, UA: 1.02 (ref 1.010–1.025)
Urobilinogen, UA: 0.2 E.U./dL
pH, UA: 6 (ref 5.0–8.0)

## 2019-04-09 NOTE — Assessment & Plan Note (Addendum)
No clear signs of delirium or depression.  Well-appearing on exam without signs of infection.  Suspect this is Alzheimer's dementia (moderate to severe) based on recent MoCA score, however would need to rule out other sources with blood work and brain MRI. - Checking basic dementia labs including CBC, CMET, TSH, HIV, RPR, B12 - We will pursue brain MRI if labs are reassuring and consider treatment if this supports Alzheimer's dementia - Reviewed return precautions, plan to follow-up by tele-visit

## 2019-04-09 NOTE — Progress Notes (Signed)
   Subjective   Patient ID: [name]    DOB: March 11, 1924, 85 y.o. female   MRN: [id number]  CC: "confusion"  HPI: [name] is a 85 y.o. female who presents to clinic today for the following:  Confusion: [name] is well-known to me and is presenting with her [name].  [name] has concerns along with her sister [name] about worsening dementia.  They report occasional episodes where [name] makes falls claims.  This includes receiving gifts from family members or stating things have been said that were not said.  She was seen in the ED back in March for confusion which was attributed to asymptomatic bacteriuria, however patient was found to be hypokalemic and required hospitalization.  She did receive a MoCA exam with a score of 5/19 (primarily due to inability to read and write).    [name] denies symptoms of dysuria, hematuria, fevers or chills, nausea or vomiting, suprapubic pain, flank pain, shortness of breath, chest pain, recent head trauma.  She is wheelchair-bound and did have a fall back in May resulting in a fracture of her left patella.  She primarily lives with her [name] but occasionally stays with [name].  [name] says they are trying to keep her at [facility] since she is the primary area of residency for [name].  They have not pursued advanced directives at this point but are interested.  They would like to pursue work-up for dementia today.  ROS: see HPI for pertinent.  [place]: Reviewed. Smoking status reviewed. Medications reviewed.  Objective   BP 128/62   Pulse 67   SpO2 97%  Vitals and nursing note reviewed.  General: A&Ox3, elderly female in wheelchair, NAD with non-toxic appearance HEENT: normocephalic, atraumatic, moist mucous membranes, bilateral cataracts present Neck: supple, non-tender without lymphadenopathy Cardiovascular: regular rate and rhythm without murmurs, rubs, or gallops Lungs: clear to auscultation bilaterally with normal work of  breathing Abdomen: soft, non-tender, non-distended, normoactive bowel sounds GU: absent suprapubic tenderness and costovertebral tenderness Skin: warm, dry, no rashes or lesions, cap refill < 2 seconds Extremities: warm and well perfused, normal tone, no edema, 5/5 motor strength on all extremities Neuro: CNII-XII intact Psych: pleasantly demented, euthymic mood, congruent affect  Assessment & Plan   Dementia without behavioral disturbance No clear signs of delirium or depression.  Well-appearing on exam without signs of infection.  Suspect this is Alzheimer's dementia (moderate to severe) based on recent MoCA score, however would need to rule out other sources with blood work and brain MRI. - Checking basic dementia labs including CBC, CMET, TSH, HIV, RPR, B12 - We will pursue brain MRI if labs are reassuring and consider treatment if this supports Alzheimer's dementia - Reviewed return precautions, plan to follow-up by tele-visit  Orders Placed This Encounter  Procedures  . CBC  . Comprehensive metabolic panel    Order Specific Question:   Has the patient fasted?    Answer:   No  . RPR  . HIV antibody (with reflex)  . TSH  . Vitamin B12  . POCT urinalysis dipstick   No orders of the defined types were placed in this encounter.   [name], [address], PGY-3 04/09/2019, 10:49 AM

## 2019-04-09 NOTE — Patient Instructions (Signed)
Thank you for coming in to see [place] today. Please see below to review our plan for today's visit.  1.  I will call you with the results of your labs to discuss the next steps towards the dementia work-up.  Ms. Cegielski does not have signs of a urinary infection and should not be tested for this. 2.  I provided you paperwork for advanced directives.  Please review these with your loved ones.  Please call the clinic at [phone] if your symptoms worsen or you have any concerns. It was our pleasure to serve you.  [name], [place], PGY-3

## 2019-04-11 ENCOUNTER — Other Ambulatory Visit: Payer: Self-pay | Admitting: Family Medicine

## 2019-04-11 ENCOUNTER — Telehealth: Payer: Self-pay | Admitting: Family Medicine

## 2019-04-11 ENCOUNTER — Other Ambulatory Visit: Payer: Self-pay | Admitting: *Deleted

## 2019-04-11 DIAGNOSIS — F0280 Dementia in other diseases classified elsewhere without behavioral disturbance: Secondary | ICD-10-CM

## 2019-04-11 DIAGNOSIS — F0150 Vascular dementia without behavioral disturbance: Secondary | ICD-10-CM

## 2019-04-11 DIAGNOSIS — G309 Alzheimer's disease, unspecified: Secondary | ICD-10-CM

## 2019-04-11 LAB — COMPREHENSIVE METABOLIC PANEL
ALT: 7 IU/L (ref 0–32)
AST: 12 IU/L (ref 0–40)
Albumin/Globulin Ratio: 1.1 — ABNORMAL LOW (ref 1.2–2.2)
Albumin: 3.7 g/dL (ref 3.5–4.6)
Alkaline Phosphatase: 71 IU/L (ref 39–117)
BUN/Creatinine Ratio: 10 — ABNORMAL LOW (ref 12–28)
BUN: 10 mg/dL (ref 10–36)
Bilirubin Total: 0.3 mg/dL (ref 0.0–1.2)
CO2: 19 mmol/L — ABNORMAL LOW (ref 20–29)
Calcium: 11.4 mg/dL — ABNORMAL HIGH (ref 8.7–10.3)
Chloride: 106 mmol/L (ref 96–106)
Creatinine, Ser: 1.05 mg/dL — ABNORMAL HIGH (ref 0.57–1.00)
GFR calc Af Amer: 53 mL/min/{1.73_m2} — ABNORMAL LOW (ref >59)
GFR calc non Af Amer: 46 mL/min/{1.73_m2} — ABNORMAL LOW (ref >59)
Globulin, Total: 3.3 g/dL (ref 1.5–4.5)
Glucose: 224 mg/dL — ABNORMAL HIGH (ref 65–99)
Potassium: 3.6 mmol/L (ref 3.5–5.2)
Sodium: 141 mmol/L (ref 134–144)
Total Protein: 7 g/dL (ref 6.0–8.5)

## 2019-04-11 LAB — CBC
Hematocrit: 34.3 % (ref 34.0–46.6)
Hemoglobin: 10.4 g/dL — ABNORMAL LOW (ref 11.1–15.9)
MCH: 23 pg — ABNORMAL LOW (ref 26.6–33.0)
MCHC: 30.3 g/dL — ABNORMAL LOW (ref 31.5–35.7)
MCV: 76 fL — ABNORMAL LOW (ref 79–97)
Platelets: 256 10*3/uL (ref 150–450)
RBC: 4.52 x10E6/uL (ref 3.77–5.28)
RDW: 16.8 % — ABNORMAL HIGH (ref 11.7–15.4)
WBC: 6.3 10*3/uL (ref 3.4–10.8)

## 2019-04-11 LAB — TSH: TSH: 2.96 u[IU]/mL (ref 0.450–4.500)

## 2019-04-11 LAB — RPR: RPR Ser Ql: NONREACTIVE

## 2019-04-11 LAB — HIV ANTIBODY (ROUTINE TESTING W REFLEX): HIV Screen 4th Generation wRfx: NONREACTIVE

## 2019-04-11 LAB — VITAMIN B12: Vitamin B-12: 835 pg/mL (ref 232–1245)

## 2019-04-11 MED ORDER — DONEPEZIL HCL 5 MG PO TABS: 5.0000 mg | ORAL_TABLET | Freq: Every day | ORAL | 0 refills | Status: DC

## 2019-04-11 MED ORDER — TRAZODONE HCL 50 MG PO TABS: 25.0000 mg | ORAL_TABLET | Freq: Every evening | ORAL | 0 refills | Status: DC | PRN

## 2019-04-11 NOTE — Addendum Note (Signed)
Addended byYisroel Ramming, DAVID J on: 04/11/2019 10:41 AM   Modules accepted: Orders

## 2019-04-11 NOTE — Telephone Encounter (Signed)
Called patient to follow-up lab results for dementia work-up on 04/09/2019.  Spoke with patient's daughter [name] who is 1 of her primary caregivers.  Overall, labs are reassuring.  History of old infarcts based on brain MRI/MRA back in July 2019.  Suspect this is a mixed picture of Alzheimer's dementia and vascular dementia.  Family would like to proceed with trial of Aricept.  Will give patient a 43-month trial and observe if there are improvements of her symptoms, primarily forgetfulness of deceased family members and other false claims.  If Aricept fails, could consider Namenda based on family's preference.  [name], [place], PGY-3

## 2019-04-13 ENCOUNTER — Other Ambulatory Visit: Payer: Self-pay

## 2019-04-13 ENCOUNTER — Emergency Department (HOSPITAL_COMMUNITY): Payer: Medicare Other

## 2019-04-13 ENCOUNTER — Emergency Department (HOSPITAL_COMMUNITY)
Admission: EM | Admit: 2019-04-13 | Discharge: 2019-04-13 | Disposition: A | Discharge status: Home / Self Care | Payer: Medicare Other | Attending: Emergency Medicine | Admitting: Emergency Medicine

## 2019-04-13 DIAGNOSIS — I2510 Atherosclerotic heart disease of native coronary artery without angina pectoris: Secondary | ICD-10-CM | POA: Diagnosis not present

## 2019-04-13 DIAGNOSIS — S299XXA Unspecified injury of thorax, initial encounter: Secondary | ICD-10-CM | POA: Diagnosis not present

## 2019-04-13 DIAGNOSIS — Z853 Personal history of malignant neoplasm of breast: Secondary | ICD-10-CM | POA: Insufficient documentation

## 2019-04-13 DIAGNOSIS — W19XXXA Unspecified fall, initial encounter: Secondary | ICD-10-CM

## 2019-04-13 DIAGNOSIS — M545 Low back pain: Secondary | ICD-10-CM | POA: Insufficient documentation

## 2019-04-13 DIAGNOSIS — I959 Hypotension, unspecified: Secondary | ICD-10-CM | POA: Diagnosis not present

## 2019-04-13 DIAGNOSIS — Z96651 Presence of right artificial knee joint: Secondary | ICD-10-CM | POA: Diagnosis not present

## 2019-04-13 DIAGNOSIS — W06XXXA Fall from bed, initial encounter: Secondary | ICD-10-CM | POA: Insufficient documentation

## 2019-04-13 DIAGNOSIS — M546 Pain in thoracic spine: Secondary | ICD-10-CM | POA: Insufficient documentation

## 2019-04-13 DIAGNOSIS — Z79899 Other long term (current) drug therapy: Secondary | ICD-10-CM | POA: Diagnosis not present

## 2019-04-13 DIAGNOSIS — E1140 Type 2 diabetes mellitus with diabetic neuropathy, unspecified: Secondary | ICD-10-CM | POA: Insufficient documentation

## 2019-04-13 DIAGNOSIS — Z7982 Long term (current) use of aspirin: Secondary | ICD-10-CM | POA: Diagnosis not present

## 2019-04-13 DIAGNOSIS — Z85038 Personal history of other malignant neoplasm of large intestine: Secondary | ICD-10-CM | POA: Diagnosis not present

## 2019-04-13 DIAGNOSIS — F17290 Nicotine dependence, other tobacco product, uncomplicated: Secondary | ICD-10-CM | POA: Insufficient documentation

## 2019-04-13 DIAGNOSIS — I110 Hypertensive heart disease with heart failure: Secondary | ICD-10-CM | POA: Diagnosis not present

## 2019-04-13 DIAGNOSIS — R52 Pain, unspecified: Secondary | ICD-10-CM | POA: Diagnosis not present

## 2019-04-13 DIAGNOSIS — I5020 Unspecified systolic (congestive) heart failure: Secondary | ICD-10-CM | POA: Insufficient documentation

## 2019-04-13 DIAGNOSIS — S3992XA Unspecified injury of lower back, initial encounter: Secondary | ICD-10-CM | POA: Diagnosis not present

## 2019-04-13 DIAGNOSIS — M5489 Other dorsalgia: Secondary | ICD-10-CM | POA: Diagnosis not present

## 2019-04-13 MED ORDER — HYDROCODONE-ACETAMINOPHEN 5-325 MG PO TABS: 2.0000 | ORAL_TABLET | Freq: Four times a day (QID) | ORAL | 0 refills | Status: DC | PRN

## 2019-04-13 MED ORDER — ONDANSETRON 4 MG PO TBDP
4.0000 mg | ORAL_TABLET | Freq: Once | ORAL | Status: AC
  Administered 2019-04-13: 4 mg via ORAL
  Filled 2019-04-13: qty 1

## 2019-04-13 MED ORDER — OXYCODONE-ACETAMINOPHEN 5-325 MG PO TABS
1.0000 | ORAL_TABLET | Freq: Once | ORAL | Status: AC
  Administered 2019-04-13: 1 via ORAL
  Filled 2019-04-13: qty 1

## 2019-04-13 NOTE — Discharge Instructions (Addendum)
You have been seen today for back pain. Please read and follow all provided instructions. Return to the emergency room for worsening condition or new concerning symptoms.    Your x-rays today do not show any fractures.  However if pain worsens, please follow-up with primary care doctor or return to the emergency room.   1. Medications:  Prescription sent to your pharmacy for pain medication hydrocodone.  Please take only for severe pain, take as directed.  If you are having pain but it is not severe, we recommend taking Tylenol.  Do not take Tylenol and hydrocodone at the same time because hydrocodone contains Tylenol in it. Continue usual home medications Take medications as prescribed. Please review all of the medicines and only take them if you do not have an allergy to them.   2. Treatment: rest, drink plenty of fluids  3. Follow Up: Please follow up with your primary doctor in 2-5 days for discussion of your diagnoses and further evaluation after today's visit; Call today to arrange your follow up.  If you do not have a primary care doctor use the resource guide provided to find one;   It is also a possibility that you have an allergic reaction to any of the medicines that you have been prescribed - Everybody reacts differently to medications and while MOST people have no trouble with most medicines, you may have a reaction such as nausea, vomiting, rash, swelling, shortness of breath. If this is the case, please stop taking the medicine immediately and contact your physician.  ?

## 2019-04-13 NOTE — ED Triage Notes (Signed)
Came in via ems; c/o fall last Friday; reported -LOC; fall is mechanical per family report. Pt c/o left leg numbness; c/o of severe middle back pain

## 2019-04-13 NOTE — ED Provider Notes (Signed)
[facility] EMERGENCY DEPARTMENT Provider Note   CSN: [id number] Arrival date & time: 04/13/19  1754    History   Chief Complaint Chief Complaint  Patient presents with  . Fall    HPI [name] is a 85 y.o. female with history of dementia presenting to emergency department today with chief complaint of back pain x3 days.  Patient states she roll out of the bed and she landed on the floor.  She landed on her right side.  Denies hitting her head or loss of consciousness.  Patient has pain in her mid and lower back.  The pain 10 out of 10 in severity and states it has been constant.  She took Tylenol for pain prior to arrival without symptom improvement.  Patient is not on blood thinners.  She is reporting decreased sensation in her left leg.  She ambulates with a walker at home. Denies fevers, weight loss, numbness/weakness of upper and lower extremities, bowel/bladder incontinence, urinary retention, saddle anesthesia, history of back surgery, history of IVDA.  Spoke with patient's daughter [name] who states no change in patient's activity since the fall. Past Medical History:  Diagnosis Date  . Allergy   . Anemia    Iron def anemia  . Arthritis   . Asthma   . Breast cancer ([place]) 10/2010   Invasive Ductal Carcinoma, s/p bilateral mastectomy, now on Tamoxifen. Followed by Dr Jamse Arn.   . Breast cancer ([place]) 2011   s/p Bilateral masectomy  . CAD (coronary artery disease)   . Carotid artery aneurysm ([place]) 08/2010   right ICA, 5 x 68mm  . CHF (congestive heart failure) (HCC)    Systolic   . Colon cancer ([place])   . Colon cancer ([place]) 12/11/2013  . DDD (degenerative disc disease), cervical   . Diabetes mellitus   . Diverticulosis   . GERD (gastroesophageal reflux disease)   . H/O: GI bleed   . Hiatal hernia   . History of colon cancer   . Hypercalcemia 06/09/2014  . Hyperlipidemia   . Hypertension   . Myocardial infarction ([place])   . Neuropathy   . Pneumonia  03/2012  . Pressure injury of heel, stage 2 ([place]) 08/31/2016  . Shortness of breath     Patient Active Problem List   Diagnosis Date Noted  . Constipation 01/29/2019  . Chronic breast pain 12/26/2018  . Pain in gums 12/26/2018  . History of deep vein thrombosis 10/17/2018  . Severe protein-calorie malnutrition Altamease Oiler: less than 60% of standard weight) ([place]) 10/17/2018  . Disorder associated with well-controlled type 2 diabetes mellitus ([place]) 10/17/2018  . History of pulmonary embolus (PE) 10/17/2018  . Hypomagnesemia 08/27/2018  . Left ventricular hypertrophy by electrocardiogram 08/27/2018  . Atypical chest pain 08/26/2018  . Advanced nonexudative age-related macular degeneration of both eyes without subfoveal involvement 07/30/2018  . Altered mental status 06/06/2018  . Microcytic anemia   . TIA (transient ischemic attack) 06/03/2018  . Mobility impaired 04/26/2018  . Protein-calorie malnutrition, severe 02/06/2017  . Urinary tract infection without hematuria 02/03/2017  . Hypokalemia 10/13/2016  . Pressure injury of heel, stage 2 ([place]) 08/31/2016  . Pulmonary embolus ([place]) 08/30/2016  . Dementia without behavioral disturbance ([place]) 06/01/2016  . Depression 01/20/2016  . Suicidal ideation 01/19/2016  . Prolapse of female pelvic organs 12/28/2015  . Leg weakness, bilateral 12/08/2015  . Weakness of right lower extremity 11/25/2015  . Dry eyes 10/28/2015  . (HFpEF) heart failure with preserved ejection fraction ([place])   .  Gout 05/14/2015  . Delirium secondary to multiple medical problems 04/15/2015  . Primary hypertension   . Seasonal allergies 02/10/2015  . Stroke-like symptoms   . Stroke-like episode ([place]) 01/25/2015  . Syncope and collapse 01/25/2015  . Dizziness 09/19/2014  . Anemia of chronic illness 08/19/2014  . Acute superficial venous thrombosis of right lower extremity 08/19/2014  . Hypercalcemia 06/09/2014  . Breast cancer, left breast ([place]) 06/09/2014  .  Breast cancer, right breast ([place]) 06/09/2014  . Edema of left lower extremity 03/30/2014  . PAD (peripheral artery disease) ([place]) 03/30/2014  . History of colon cancer 12/11/2013  . History of fall 05/15/2013  . Stricture and stenosis of esophagus 10/27/2012  . Thoracic back pain 05/24/2012  . Productive cough 04/30/2012  . Tobacco use disorder 06/02/2011  . Post-mastectomy pain 05/02/2011  . Hiatal hernia 03/22/2011  . 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 IDA (iron deficiency anemia) 04/29/2007  . Controlled type 2 diabetes mellitus with diabetic neuropathy, without long-term current use of insulin ([place]) 01/10/2007  . Coronary atherosclerosis 01/10/2007  . Asthma 01/10/2007  . Reflux esophagitis 01/10/2007  . Osteoarthritis, multiple sites 01/10/2007    Past Surgical History:  Procedure Laterality Date  . Breast masectomy  10/2010   Bilateral masectomy by Dr Lucia Gaskins s/p invasive ductal carcinoma.  [facility] Kitchen BREAST SURGERY    . Cardiac  Catherization  2007   Severe 3-vessel disease.  EF 20-25%.  . ESOPHAGOGASTRODUODENOSCOPY  2001   Esophageal Tear  . ESOPHAGOGASTRODUODENOSCOPY  10/27/2012   Procedure: ESOPHAGOGASTRODUODENOSCOPY (EGD);  Surgeon: Irene Shipper, MD;  Location: [facility] ENDOSCOPY;  Service: Endoscopy;  Laterality: N/A;  pat  . JOINT REPLACEMENT Right 2001   Knee  . TOTAL KNEE ARTHROPLASTY  2001     OB History   No obstetric history on file.      Home Medications    Prior to Admission medications   Medication Sig Start Date End Date Taking? Authorizing Provider  acetaminophen (TYLENOL) 500 MG tablet Take 500 mg by mouth every 6 (six) hours as needed for moderate pain.     [provider]  albuterol (PROVENTIL HFA;VENTOLIN HFA) 108 (90 BASE) MCG/ACT inhaler Inhale 2 puffs into the lungs every 6 (six) hours as needed. For shortness of breath 01/18/15   [name], MD  Amino Acids-Protein Hydrolys (FEEDING SUPPLEMENT, PRO-STAT SUGAR FREE 64,) LIQD Take  30 mLs by mouth 2 (two) times daily with a meal. ensure 01/25/19   [name], MD  amLODipine (NORVASC) 5 MG tablet Take 1 tablet (5 mg total) by mouth daily. 02/14/19   [place] Bing, DO  aspirin EC 325 MG tablet Take 1 tablet (325 mg total) by mouth daily. 07/05/18   [place] Bing, DO  carvedilol (COREG) 12.5 MG tablet Take 1 tablet (12.5 mg total) by mouth 2 (two) times daily with a meal. 02/06/19   [place] Bing, DO  diclofenac sodium (VOLTAREN) 1 % GEL Apply 2 g topically 4 (four) times daily. Patient not taking: Reported on 11/27/2018 08/29/18   Meccariello, Bernita Raisin, DO  donepezil (ARICEPT) 5 MG tablet Take 1 tablet (5 mg total) by mouth at bedtime. 04/11/19   [place] Bing, DO  fluticasone (FLONASE) 50 MCG/ACT nasal spray PLACE 1 SPRAY INTO BOTH NOSTRILS DAILY. 04/03/19 04/02/20  [place] Bing, DO  HYDROcodone-acetaminophen (NORCO/VICODIN) 5-325 MG tablet Take 2 tablets by mouth every 6 (six) hours as needed for severe pain. 04/13/19   Lashan Gluth E, PA-C  loratadine (CLARITIN)  10 MG tablet TAKE 1/2 TABLET BY MOUTH DAILY Patient taking differently: Take 5 mg by mouth daily as needed.  12/08/15   [name], MD  losartan (COZAAR) 50 MG tablet Take 1 tablet (50 mg total) by mouth daily. Patient taking differently: Take 50-100 mg by mouth 2 (two) times daily. Take 100mg  in the morning and 50mg  in the evening 07/23/18   [place] Bing, DO  pantoprazole (PROTONIX) 40 MG tablet TAKE 1 TABLET BY MOUTH EVERY DAY Patient taking differently: Take 40 mg by mouth daily.  10/14/18   [place] Bing, DO  polyethylene glycol (MIRALAX / GLYCOLAX) packet Take 17 g by mouth daily. 01/25/19   [name], MD  ranitidine (ZANTAC) 150 MG tablet TAKE 1 TABLET BY MOUTH TWICE A DAY 07/29/18   [place] Bing, DO  rosuvastatin (CRESTOR) 20 MG tablet TAKE 1 TABLET BY MOUTH EVERYDAY AT BEDTIME 09/09/18   [place] Bing, DO  senna-docusate (SENOKOT-S) 8.6-50 MG tablet Take 2 tablets by  mouth at bedtime. Patient taking differently: Take 1 tablet by mouth at bedtime as needed for mild constipation.  04/23/17   Virginia Crews, MD  traZODone (DESYREL) 50 MG tablet Take 0.5-1 tablets (25-50 mg total) by mouth at bedtime as needed for sleep. 04/11/19   [place] Bing, DO  cetirizine (ZYRTEC) 5 MG tablet Take 1 tablet (5 mg total) by mouth daily. Take at night time as it can cause some drowsiness 05/24/12 07/09/12  Losq, Burnell Blanks, MD    Family History Family History  Problem Relation Age of Onset  . Sickle cell anemia Other   . Heart disease Mother   . Heart disease Father   . Cancer Daughter 48       breast ca  . Hypertension Daughter   . Cancer Daughter 39       breast ca  . Hypertension Daughter   . Heart disease Son        Poor circulation-Left Leg  . Diabetes Daughter   . Hypertension Daughter   . Hyperlipidemia Daughter        Poor circulation- Toe amputation    Social History Social History   Tobacco Use  . Smoking status: Never Smoker  . Smokeless tobacco: Current User    Types: Snuff  Substance Use Topics  . Alcohol use: No  . Drug use: No     Allergies   Tape and Lisinopril   Review of Systems Review of Systems  Constitutional: Negative for chills and fever.  HENT: Negative for congestion, ear discharge, ear pain, sinus pressure, sinus pain and sore throat.   Eyes: Negative for pain and redness.  Respiratory: Negative for cough and shortness of breath.   Cardiovascular: Negative for chest pain.  Gastrointestinal: Negative for abdominal pain, constipation, diarrhea, nausea and vomiting.  Genitourinary: Negative for dysuria and hematuria.  Musculoskeletal: Positive for back pain. Negative for neck pain.  Skin: Negative for wound.  Neurological: Negative for weakness, numbness and headaches.     Physical Exam Updated Vital Signs BP (!) 179/50 (BP Location: Right Arm)   Pulse (!) 52   Temp (!) 97.3 F (36.3 C) (Oral)   Ht 5\' 5"   (1.651 m)   Wt 52.2 kg   SpO2 100%   BMI 19.14 kg/m   Physical Exam Vitals signs and nursing note reviewed.  Constitutional:      General: She is not in acute distress.    Appearance: She is not ill-appearing.  Interventions: Cervical collar in place.  HENT:     Head: Normocephalic and atraumatic. No raccoon eyes or Battle's sign.     Jaw: There is normal jaw occlusion. No tenderness.     Comments: No tenderness to palpation of skull. No deformities or crepitus noted. No open wounds, abrasions or lacerations.    Right Ear: Tympanic membrane and external ear normal. No hemotympanum.     Left Ear: Tympanic membrane and external ear normal. No hemotympanum.     Nose: Nose normal.     Mouth/Throat:     Mouth: Mucous membranes are moist.     Pharynx: Oropharynx is clear.  Eyes:     General: No scleral icterus.       Right eye: No discharge.        Left eye: No discharge.     Extraocular Movements: Extraocular movements intact.     Conjunctiva/sclera: Conjunctivae normal.     Pupils: Pupils are equal, round, and reactive to light.  Neck:     Musculoskeletal: Normal range of motion.     Vascular: No JVD.     Comments: Full ROM intact without spinous process TTP. No bony stepoffs or deformities, no paraspinous muscle TTP or muscle spasms. No rigidity or meningeal signs. No bruising, erythema, or swelling.  Cardiovascular:     Rate and Rhythm: Normal rate and regular rhythm.     Pulses: Normal pulses.          Radial pulses are 2+ on the right side and 2+ on the left side.       Dorsalis pedis pulses are 2+ on the right side and 2+ on the left side.     Heart sounds: Normal heart sounds.  Pulmonary:     Comments: Lungs clear to auscultation in all fields. Symmetric chest rise. No wheezing, rales, or rhonchi. Chest:     Chest wall: No tenderness.     Comments: No anterior chest wall tenderness.  No deformity or crepitus noted.    Abdominal:     Comments: Abdomen is soft,  non-distended, and non-tender in all quadrants. No rigidity, no guarding. No peritoneal signs.  Musculoskeletal:     Right shoulder: Normal.     Left shoulder: Normal.     Right elbow: Normal.    Left elbow: Normal.     Right wrist: Normal.     Left wrist: Normal.     Right hip: Normal.     Left hip: Normal.     Left knee: Normal.     Left ankle: Normal.     Comments: Full range of motion of the thoracic spine and lumbar spine with flexion, hyperextension, and lateral flexion. No stepoffs.Tenderness to palpation of the spinous processes of right side of thoracic spine and lumbar spine. No tenderness to palpation of the paraspinous muscles of theof thoracic and lumbar spine. No ecchymosis, edema or erythema on patient's back.  Patient with full range of motion of all extremities. Pelvis is stable.  Sensation grossly intact to light touch in the lower extremities bilaterally. No saddle anesthesias. Strength 5/5 with flexion and extension at the bilateral hips, knees, and ankles.    Skin:    General: Skin is warm and dry.     Capillary Refill: Capillary refill takes less than 2 seconds.  Neurological:     Mental Status: She is alert and oriented to person, place, and time.     GCS: GCS eye subscore is 4. GCS verbal subscore  is 5. GCS motor subscore is 6.     Comments: Fluent speech, no facial droop.  Psychiatric:        Behavior: Behavior normal.      ED Treatments / Results  Labs (all labs ordered are listed, but only abnormal results are displayed) Labs Reviewed - No data to display  EKG None  Radiology Dg Thoracic Spine 2 View  Result Date: 04/13/2019 CLINICAL DATA:  Recent falls.  Leg numbness. EXAM: THORACIC SPINE 2 VIEWS COMPARISON:  November 27, 2018 FINDINGS: Suggested mild anterior wedging of an upper lumbar vertebral body. Suggested retrolisthesis at this level as well. No fractures or malalignment in the thoracic spine. No other acute abnormalities. IMPRESSION: 1.  No thoracic spine fracture or traumatic malalignment. 2. Suggested mild anterior wedging of an upper lumbar vertebral body, incompletely evaluated. Please see the lumbar spine x-ray from today for further evaluation. There may be mild malalignment at this level as well. Electronically Signed   By: Dorise Bullion III M.D   On: 04/13/2019 19:45   Dg Lumbar Spine Complete  Result Date: 04/13/2019 CLINICAL DATA:  Pain after fall. EXAM: LUMBAR SPINE - COMPLETE 4+ VIEW COMPARISON:  CT scan January 25, 2014 FINDINGS: Anterior wedging of L2 is stable since March of 2015. No other fractures. Retrolisthesis of L2 versus L3, also stable since the comparison study. Degenerative changes. Atherosclerotic changes in the abdominal aorta. IMPRESSION: 1. No interval change in the anterior wedging of L2, the retrolisthesis of L2 versus L3, or the degenerative changes. Electronically Signed   By: Dorise Bullion III M.D   On: 04/13/2019 19:47    Procedures Procedures (including critical care time)  Medications Ordered in ED Medications  oxyCODONE-acetaminophen (PERCOCET/ROXICET) 5-325 MG per tablet 1 tablet (1 tablet Oral Given 04/13/19 1835)  ondansetron (ZOFRAN-ODT) disintegrating tablet 4 mg (4 mg Oral Given 04/13/19 1833)     Initial Impression / Assessment and Plan / ED Course  I have reviewed the triage vital signs and the nursing notes.  Pertinent labs & imaging results that were available during my care of the patient were reviewed by me and considered in my medical decision making (see chart for details).  Presents with back pain after rolling out of bed.  She is afebrile, nontoxic-appearing.  She has tenderness over spinous processes of thoracic and lumbar spine.  Will give p.o. pain medicine and reassess.  No red flag symptoms to suggest cauda equina.  Films viewed by myself and attending Dr. Venora Maples without sign of obvious fracture.  On reassessment patient reports pain has significantly improved after  p.o. Percocet.  Myself and RN ambulated patient with a walker, she uses a walker at home.  Patient ambulated out difficulty and had steady gait.    Patient is hemodynamically stable, in NAD, and able to ambulate in the ED. Evaluation does not show pathology that would require ongoing emergent intervention or inpatient treatment. I explained the diagnosis to the patient.  Pain has been managed and has no complaints prior to discharge. Patient is comfortable with above plan and is stable for discharge at this time. All questions were answered prior to disposition.Discussed possibility of compression fracture with patient's daughter and gave strict return precautions. Encouraged follow up with PCP.    Final Clinical Impressions(s) / ED Diagnoses   Final diagnoses:  Fall, initial encounter    ED Discharge Orders         Ordered    HYDROcodone-acetaminophen (NORCO/VICODIN) 5-325 MG tablet  Every  6 hours PRN     04/13/19 2041           Flint Melter 04/13/19 2228    Jola Schmidt, MD 04/17/19 [phone]

## 2019-04-13 NOTE — ED Notes (Signed)
Patient transported to X-ray 

## 2019-04-16 ENCOUNTER — Other Ambulatory Visit: Payer: Self-pay

## 2019-04-16 ENCOUNTER — Telehealth (INDEPENDENT_AMBULATORY_CARE_PROVIDER_SITE_OTHER): Payer: Medicare Other | Admitting: Family Medicine

## 2019-04-16 DIAGNOSIS — Z9181 History of falling: Secondary | ICD-10-CM

## 2019-04-16 NOTE — Assessment & Plan Note (Signed)
Recent history of low impact fall which was unwitnessed.  History of falls in the past.  Not on blood thinners.  Symptoms of nausea and vomiting along with headache concerning for occult brain bleed, especially given no head imaging during ED visit following fall.  Discussed this with daughters who are in agreement that would like her mother to be evaluated in the ED. - Daughter [name] will take patient to hospital for evaluation - Reviewed return precautions and reasons to call 911

## 2019-04-16 NOTE — Progress Notes (Signed)
[facility] Telemedicine Visit  Patient consented to have virtual visit. Method of visit: Telephone  Encounter participants: Patient: [name] - located at home Provider: Early Bing - located at office Others (if applicable): Daughters [name] and [name]  Chief Complaint: "Vomiting"  HPI:  I spoke with [name] daughters [name] and [name] concerning persistent vomiting over the last 24 hours and inability to tolerate oral feeds.  Patient unable to provide adequate history given underlying dementia.  Majority of history was received by her [name].  [name] had a fall when she rolled out of her bed and landed on her carpet 5 days ago.  Patient was with her home aide, however fall was not witnessed.  She was subsequently seen at the ED 2 days later for 3 days of back pain at the thoracic site.  Patient denied hitting her head or LOC.  She reported 10/10 back pain.  She does not take blood thinners.  She ambulates with a walker at baseline.  She received thoracic and lumbar plain films without acute findings.  Patient was safe for discharge and given Norco for pain control.  Since discharge, patient began having persistent vomiting since 3:30 PM yesterday.  She is unable to keep fluids down and has decreased urine output according to her [name].  She does have underlying dementia which is currently being worked up and thought to be mixed related to Alzheimer's and vascular dementia.  Patient did report a headache to her daughters yesterday.  She did not receive head imaging during the ED visit on 04/13/2019.  Daughters are concerned and would like her to be admitted to the hospital.  ROS: per HPI  Pertinent PMHx: Reviewed  Exam:  Respiratory: Speaking in clear sentences without dysarthria  Assessment/Plan:  History of fall Recent history of low impact fall which was unwitnessed.  History of falls in the past.  Not on blood  thinners.  Symptoms of nausea and vomiting along with headache concerning for occult brain bleed, especially given no head imaging during ED visit following fall.  Discussed this with daughters who are in agreement that would like her mother to be evaluated in the ED. - [name] will take patient to hospital for evaluation - Reviewed return precautions and reasons to call 911    Time spent during visit with patient: 25 minutes

## 2019-04-21 ENCOUNTER — Other Ambulatory Visit: Payer: Self-pay

## 2019-04-21 ENCOUNTER — Emergency Department (HOSPITAL_COMMUNITY): Payer: Medicare Other

## 2019-04-21 ENCOUNTER — Encounter (HOSPITAL_COMMUNITY): Payer: Self-pay | Admitting: Emergency Medicine

## 2019-04-21 ENCOUNTER — Emergency Department (HOSPITAL_COMMUNITY)
Admission: EM | Admit: 2019-04-21 | Discharge: 2019-04-21 | Disposition: A | Discharge status: Home / Self Care | Payer: Medicare Other | Attending: Emergency Medicine | Admitting: Emergency Medicine

## 2019-04-21 DIAGNOSIS — R879 Unspecified abnormal finding in specimens from female genital organs: Secondary | ICD-10-CM | POA: Diagnosis not present

## 2019-04-21 DIAGNOSIS — I5022 Chronic systolic (congestive) heart failure: Secondary | ICD-10-CM | POA: Diagnosis not present

## 2019-04-21 DIAGNOSIS — I252 Old myocardial infarction: Secondary | ICD-10-CM | POA: Diagnosis not present

## 2019-04-21 DIAGNOSIS — Z7982 Long term (current) use of aspirin: Secondary | ICD-10-CM | POA: Diagnosis not present

## 2019-04-21 DIAGNOSIS — I110 Hypertensive heart disease with heart failure: Secondary | ICD-10-CM | POA: Insufficient documentation

## 2019-04-21 DIAGNOSIS — Z79899 Other long term (current) drug therapy: Secondary | ICD-10-CM | POA: Insufficient documentation

## 2019-04-21 DIAGNOSIS — R911 Solitary pulmonary nodule: Secondary | ICD-10-CM | POA: Diagnosis not present

## 2019-04-21 DIAGNOSIS — M5136 Other intervertebral disc degeneration, lumbar region: Secondary | ICD-10-CM | POA: Diagnosis not present

## 2019-04-21 DIAGNOSIS — E119 Type 2 diabetes mellitus without complications: Secondary | ICD-10-CM | POA: Diagnosis not present

## 2019-04-21 DIAGNOSIS — Z96659 Presence of unspecified artificial knee joint: Secondary | ICD-10-CM | POA: Insufficient documentation

## 2019-04-21 DIAGNOSIS — M549 Dorsalgia, unspecified: Secondary | ICD-10-CM

## 2019-04-21 DIAGNOSIS — F17220 Nicotine dependence, chewing tobacco, uncomplicated: Secondary | ICD-10-CM | POA: Insufficient documentation

## 2019-04-21 DIAGNOSIS — J45909 Unspecified asthma, uncomplicated: Secondary | ICD-10-CM | POA: Insufficient documentation

## 2019-04-21 DIAGNOSIS — I2510 Atherosclerotic heart disease of native coronary artery without angina pectoris: Secondary | ICD-10-CM | POA: Insufficient documentation

## 2019-04-21 DIAGNOSIS — M48061 Spinal stenosis, lumbar region without neurogenic claudication: Secondary | ICD-10-CM | POA: Diagnosis not present

## 2019-04-21 DIAGNOSIS — N3000 Acute cystitis without hematuria: Secondary | ICD-10-CM | POA: Insufficient documentation

## 2019-04-21 DIAGNOSIS — M47816 Spondylosis without myelopathy or radiculopathy, lumbar region: Secondary | ICD-10-CM | POA: Diagnosis not present

## 2019-04-21 DIAGNOSIS — Z8739 Personal history of other diseases of the musculoskeletal system and connective tissue: Secondary | ICD-10-CM | POA: Diagnosis not present

## 2019-04-21 DIAGNOSIS — E876 Hypokalemia: Secondary | ICD-10-CM | POA: Diagnosis not present

## 2019-04-21 DIAGNOSIS — M5489 Other dorsalgia: Secondary | ICD-10-CM | POA: Diagnosis not present

## 2019-04-21 DIAGNOSIS — M545 Low back pain: Secondary | ICD-10-CM | POA: Diagnosis not present

## 2019-04-21 DIAGNOSIS — K449 Diaphragmatic hernia without obstruction or gangrene: Secondary | ICD-10-CM | POA: Diagnosis not present

## 2019-04-21 DIAGNOSIS — Z8673 Personal history of transient ischemic attack (TIA), and cerebral infarction without residual deficits: Secondary | ICD-10-CM | POA: Diagnosis not present

## 2019-04-21 DIAGNOSIS — I700 Atherosclerosis of aorta: Secondary | ICD-10-CM | POA: Diagnosis not present

## 2019-04-21 DIAGNOSIS — R52 Pain, unspecified: Secondary | ICD-10-CM | POA: Diagnosis not present

## 2019-04-21 LAB — CBC WITH DIFFERENTIAL/PLATELET
Abs Immature Granulocytes: 0.04 10*3/uL (ref 0.00–0.07)
Basophils Absolute: 0 10*3/uL (ref 0.0–0.1)
Basophils Relative: 1 %
Eosinophils Absolute: 0.1 10*3/uL (ref 0.0–0.5)
Eosinophils Relative: 2 %
HCT: 36.1 % (ref 36.0–46.0)
Hemoglobin: 11.4 g/dL — ABNORMAL LOW (ref 12.0–15.0)
Immature Granulocytes: 1 %
Lymphocytes Relative: 27 %
Lymphs Abs: 1.3 10*3/uL (ref 0.7–4.0)
MCH: 22.7 pg — ABNORMAL LOW (ref 26.0–34.0)
MCHC: 31.6 g/dL (ref 30.0–36.0)
MCV: 71.8 fL — ABNORMAL LOW (ref 80.0–100.0)
Monocytes Absolute: 0.3 10*3/uL (ref 0.1–1.0)
Monocytes Relative: 6 %
Neutro Abs: 3.1 10*3/uL (ref 1.7–7.7)
Neutrophils Relative %: 63 %
Platelets: UNDETERMINED 10*3/uL (ref 150–400)
RBC: 5.03 MIL/uL (ref 3.87–5.11)
RDW: 17.2 % — ABNORMAL HIGH (ref 11.5–15.5)
WBC: 4.9 10*3/uL (ref 4.0–10.5)
nRBC: 0 % (ref 0.0–0.2)

## 2019-04-21 LAB — URINALYSIS, ROUTINE W REFLEX MICROSCOPIC
Bilirubin Urine: NEGATIVE
Glucose, UA: NEGATIVE mg/dL
Ketones, ur: NEGATIVE mg/dL
Nitrite: POSITIVE — AB
Protein, ur: 30 mg/dL — AB
Specific Gravity, Urine: 1.006 (ref 1.005–1.030)
pH: 7 (ref 5.0–8.0)

## 2019-04-21 LAB — BASIC METABOLIC PANEL
Anion gap: 10 (ref 5–15)
BUN: 8 mg/dL (ref 8–23)
CO2: 23 mmol/L (ref 22–32)
Calcium: 11.1 mg/dL — ABNORMAL HIGH (ref 8.9–10.3)
Chloride: 108 mmol/L (ref 98–111)
Creatinine, Ser: 0.92 mg/dL (ref 0.44–1.00)
GFR calc Af Amer: >60 mL/min (ref >60)
GFR calc non Af Amer: 54 mL/min — ABNORMAL LOW (ref >60)
Glucose, Bld: 137 mg/dL — ABNORMAL HIGH (ref 70–99)
Potassium: 3 mmol/L — ABNORMAL LOW (ref 3.5–5.1)
Sodium: 141 mmol/L (ref 135–145)

## 2019-04-21 MED ORDER — OXYCODONE HCL 5 MG PO TABS: 5.0000 mg | ORAL_TABLET | ORAL | 0 refills | Status: AC | PRN

## 2019-04-21 MED ORDER — OXYCODONE-ACETAMINOPHEN 5-325 MG PO TABS
1.0000 | ORAL_TABLET | Freq: Once | ORAL | Status: AC
  Administered 2019-04-21: 1 via ORAL
  Filled 2019-04-21: qty 1

## 2019-04-21 MED ORDER — LIDOCAINE 5 % EX PTCH
2.0000 | MEDICATED_PATCH | CUTANEOUS | Status: DC
  Administered 2019-04-21: 2 via TRANSDERMAL
  Filled 2019-04-21: qty 2

## 2019-04-21 MED ORDER — CEPHALEXIN 250 MG PO CAPS
500.0000 mg | ORAL_CAPSULE | Freq: Once | ORAL | Status: AC
  Administered 2019-04-21: 500 mg via ORAL
  Filled 2019-04-21: qty 2

## 2019-04-21 MED ORDER — ACETAMINOPHEN 325 MG PO TABS
650.0000 mg | ORAL_TABLET | Freq: Once | ORAL | Status: AC
  Administered 2019-04-21: 11:00:00 650 mg via ORAL
  Filled 2019-04-21: qty 2

## 2019-04-21 MED ORDER — POTASSIUM CHLORIDE CRYS ER 20 MEQ PO TBCR: 20.0000 meq | EXTENDED_RELEASE_TABLET | Freq: Two times a day (BID) | ORAL | 0 refills | Status: DC

## 2019-04-21 MED ORDER — CEPHALEXIN 500 MG PO CAPS: 500.0000 mg | ORAL_CAPSULE | Freq: Two times a day (BID) | ORAL | 0 refills | Status: AC

## 2019-04-21 NOTE — ED Notes (Signed)
Pt unable to sign d/t signature pad not working. Discharge instructions and prescriptions reviewed with patient and patient verbalized understanding of each.

## 2019-04-21 NOTE — Discharge Instructions (Addendum)
You were seen in the ER for back pain and increased urinary frequency.  Urinalysis showed urine infection.  Take cephalexin (Keflex) as prescribed twice a day for a total of 7 days.  Return to the ER if there is increased urinary frequency, lower abdominal pain, flank pain, fevers, chills, difficulty urinating.  CTs and x-rays of your thoracic and lumbar spine, coccyx and sacrum did not show any new injuries to your back bones.  Your pain may be from an old lumbar fracture (L2) seen since 2015, arthritis and wear and tear of back muscle from the fall.  Return for worsening back pain, fever, groin numbness, numbness or weakness to extremities, loss of bladder or bowel control.    For back pain control take 650 to 1000 mg of acetaminophen (Tylenol) every 6 hours.  For more pain control, you can take oxycodone 5 mg every 4 hours or as needed. Do not exceed 4,000 mg acetaminophen in a 24 hour period.  Do not take acetaminophen if you have liver disease.  Oxycodone is a narcotic pain medication that has risk of overdose, death, dependence and abuse. Mild and expected side effects include nausea, stomach upset, drowsiness, constipation. You should take a daily stool softener or laxative (miralax) daily with oxycodone to prevent constipation. Do not consume alcohol, drive or use heavy machinery while taking this medication. Do not leave unattended around children. Flush any remaining pills that you do not use and do not share.  The emergency department has a strict policy regarding prescription of narcotic medications. We prescribe a short course for acute, new pain or injuries. We are unable to refill this medication in the emergency department for chronic pain or repeatedly.  Refill need to be done by specialist or primary care provider or pain clinic.  Contact your primary care provider or specialist for chronic pain management and refill on narcotic medications.   Your potassium was slightly low, we recommend  daily potassium supplements for 1 week and increase intake of potassium rich foods.

## 2019-04-21 NOTE — ED Provider Notes (Addendum)
[facility] EMERGENCY DEPARTMENT Provider Note   CSN: [id number] Arrival date & time: 04/21/19  1024    History   Chief Complaint Chief Complaint  Patient presents with   Back Pain    HPI [name] is a 85 y.o. female with PMH below is here for evaluation of back pain.  Onset approximately 1 week ago after a fall off of her bed.  On 5/31 she rolled off of her bed and landed on her floor mostly on her right buttock.  States yesterday she went up/down her daughter's steps and this made her back pain worse last night.  She ambulates with walker at home.  The pain in her back is in the low back, sharp and worse with moving, sitting and walking.  She had an x-ray in the ER that was normal.  She has been taking Tylenol 3 times daily with temporary, mild relief of her pain but last night the pain became a lot worse.  For the last week she is noticed increased urinary frequency but no dysuria, hematuria, flank pain, abdominal pain, fever or chills.  She denies associated chest pain, shortness of breath, distal paresthesias or loss of sensation, saddle anesthesia, loss of bladder or bowel control.  Reports chronic constipation, her daughter helps her with enemas, last small BM 2 days ago.  No other falls or injuries to the back since 5/31. No recent surgeries or injections to back.     HPI  Past Medical History:  Diagnosis Date   Allergy    Anemia    Iron def anemia   Arthritis    Asthma    Breast cancer ([place]) 10/2010   Invasive Ductal Carcinoma, s/p bilateral mastectomy, now on Tamoxifen. Followed by Dr Jamse Arn.    Breast cancer ([place]) 2011   s/p Bilateral masectomy   CAD (coronary artery disease)    Carotid artery aneurysm ([place]) 08/2010   right ICA, 5 x 20mm   CHF (congestive heart failure) ([place])    Systolic    Colon cancer ([place])    Colon cancer ([place]) 12/11/2013   DDD (degenerative disc disease), cervical    Diabetes mellitus    Diverticulosis     GERD (gastroesophageal reflux disease)    H/O: GI bleed    Hiatal hernia    History of colon cancer    Hypercalcemia 06/09/2014   Hyperlipidemia    Hypertension    Myocardial infarction ([place])    Neuropathy    Pneumonia 03/2012   Pressure injury of heel, stage 2 ([place]) 08/31/2016   Shortness of breath     Patient Active Problem List   Diagnosis Date Noted   Constipation 01/29/2019   Chronic breast pain 12/26/2018   Pain in gums 12/26/2018   History of deep vein thrombosis 10/17/2018   Severe protein-calorie malnutrition Altamease [place]: less than 60% of standard weight) ([place]) 10/17/2018   Disorder associated with well-controlled type 2 diabetes mellitus ([place]) 10/17/2018   History of pulmonary embolus (PE) 10/17/2018   Hypomagnesemia 08/27/2018   Left ventricular hypertrophy by electrocardiogram 08/27/2018   Atypical chest pain 08/26/2018   Advanced nonexudative age-related macular degeneration of both eyes without subfoveal involvement 07/30/2018   Altered mental status 06/06/2018   Microcytic anemia    TIA (transient ischemic attack) 06/03/2018   Mobility impaired 04/26/2018   Protein-calorie malnutrition, severe 02/06/2017   Urinary tract infection without hematuria 02/03/2017   Hypokalemia 10/13/2016   Pressure injury of heel, stage 2 ([place]) 08/31/2016  Pulmonary embolus ([place]) 08/30/2016   Dementia without behavioral disturbance ([place]) 06/01/2016   Depression 01/20/2016   Suicidal ideation 01/19/2016   Prolapse of female pelvic organs 12/28/2015   Leg weakness, bilateral 12/08/2015   Weakness of right lower extremity 11/25/2015   Dry eyes 10/28/2015   (HFpEF) heart failure with preserved ejection fraction ([place])    Gout 05/14/2015   Delirium secondary to multiple medical problems 04/15/2015   Primary hypertension    Seasonal allergies 02/10/2015   Stroke-like symptoms    Stroke-like episode ([place]) 01/25/2015   Syncope and collapse  01/25/2015   Dizziness 09/19/2014   Anemia of chronic illness 08/19/2014   Acute superficial venous thrombosis of right lower extremity 08/19/2014   Hypercalcemia 06/09/2014   Breast cancer, left breast ([place]) 06/09/2014   Breast cancer, right breast ([place]) 06/09/2014   Edema of left lower extremity 03/30/2014   PAD (peripheral artery disease) ([place]) 03/30/2014   History of colon cancer 12/11/2013   History of fall 05/15/2013   Stricture and stenosis of esophagus 10/27/2012   Thoracic back pain 05/24/2012   Productive cough 04/30/2012   Tobacco use disorder 06/02/2011   Post-mastectomy pain 05/02/2011   Hiatal hernia 03/22/2011   Vertebrobasilar artery syndrome 08/22/2010   IDA (iron deficiency anemia) 04/29/2007   Controlled type 2 diabetes mellitus with diabetic neuropathy, without long-term current use of insulin ([place]) 01/10/2007   Coronary atherosclerosis 01/10/2007   Asthma 01/10/2007   Reflux esophagitis 01/10/2007   Osteoarthritis, multiple sites 01/10/2007    Past Surgical History:  Procedure Laterality Date   Breast masectomy  10/2010   Bilateral masectomy by Dr Lucia Gaskins s/p invasive ductal carcinoma.   BREAST SURGERY     Cardiac  Catherization  2007   Severe 3-vessel disease.  EF 20-25%.   ESOPHAGOGASTRODUODENOSCOPY  2001   Esophageal Tear   ESOPHAGOGASTRODUODENOSCOPY  10/27/2012   Procedure: ESOPHAGOGASTRODUODENOSCOPY (EGD);  Surgeon: Irene Shipper, MD;  Location: [facility] ENDOSCOPY;  Service: Endoscopy;  Laterality: N/A;  pat   JOINT REPLACEMENT Right 2001   Knee   TOTAL KNEE ARTHROPLASTY  2001     OB History   No obstetric history on file.      Home Medications    Prior to Admission medications   Medication Sig Start Date End Date Taking? Authorizing Provider  acetaminophen (TYLENOL) 500 MG tablet Take 500 mg by mouth every 6 (six) hours as needed for moderate pain.     [provider]  albuterol (PROVENTIL HFA;VENTOLIN HFA)  108 (90 BASE) MCG/ACT inhaler Inhale 2 puffs into the lungs every 6 (six) hours as needed. For shortness of breath 01/18/15   [name], MD  Amino Acids-Protein Hydrolys (FEEDING SUPPLEMENT, PRO-STAT SUGAR FREE 64,) LIQD Take 30 mLs by mouth 2 (two) times daily with a meal. ensure 01/25/19   [name], MD  amLODipine (NORVASC) 5 MG tablet Take 1 tablet (5 mg total) by mouth daily. 02/14/19   [place] Bing, DO  aspirin EC 325 MG tablet Take 1 tablet (325 mg total) by mouth daily. 07/05/18   Louisa Bing, DO  carvedilol (COREG) 12.5 MG tablet Take 1 tablet (12.5 mg total) by mouth 2 (two) times daily with a meal. 02/06/19    Bing, DO  cephALEXin (KEFLEX) 500 MG capsule Take 1 capsule (500 mg total) by mouth 2 (two) times daily for 7 days. 04/21/19 04/28/19  Kinnie Feil, PA-C  diclofenac sodium (VOLTAREN) 1 % GEL Apply 2 g topically 4 (four)  times daily. Patient not taking: Reported on 11/27/2018 08/29/18   Meccariello, Bernita Raisin, DO  donepezil (ARICEPT) 5 MG tablet Take 1 tablet (5 mg total) by mouth at bedtime. 04/11/19   [place] Bing, DO  fluticasone (FLONASE) 50 MCG/ACT nasal spray PLACE 1 SPRAY INTO BOTH NOSTRILS DAILY. 04/03/19 04/02/20  [place] Bing, DO  HYDROcodone-acetaminophen (NORCO/VICODIN) 5-325 MG tablet Take 2 tablets by mouth every 6 (six) hours as needed for severe pain. 04/13/19   Albrizze, Kaitlyn E, PA-C  loratadine (CLARITIN) 10 MG tablet TAKE 1/2 TABLET BY MOUTH DAILY Patient taking differently: Take 5 mg by mouth daily as needed.  12/08/15   [name], MD  losartan (COZAAR) 50 MG tablet Take 1 tablet (50 mg total) by mouth daily. Patient taking differently: Take 50-100 mg by mouth 2 (two) times daily. Take 100mg  in the morning and 50mg  in the evening 07/23/18   [place] Bing, DO  oxyCODONE (OXY IR/ROXICODONE) 5 MG immediate release tablet Take 1 tablet (5 mg total) by mouth every 4 (four) hours as needed for up to 3 days for severe pain.  04/21/19 04/24/19  Kinnie Feil, PA-C  pantoprazole (PROTONIX) 40 MG tablet TAKE 1 TABLET BY MOUTH EVERY DAY Patient taking differently: Take 40 mg by mouth daily.  10/14/18   [place] Bing, DO  polyethylene glycol (MIRALAX / GLYCOLAX) packet Take 17 g by mouth daily. 01/25/19   [name], MD  potassium chloride SA (K-DUR) 20 MEQ tablet Take 1 tablet (20 mEq total) by mouth 2 (two) times daily for 7 days. 04/21/19 04/28/19  Kinnie Feil, PA-C  ranitidine (ZANTAC) 150 MG tablet TAKE 1 TABLET BY MOUTH TWICE A DAY 07/29/18   [place] Bing, DO  rosuvastatin (CRESTOR) 20 MG tablet TAKE 1 TABLET BY MOUTH EVERYDAY AT BEDTIME 09/09/18   [place] Bing, DO  senna-docusate (SENOKOT-S) 8.6-50 MG tablet Take 2 tablets by mouth at bedtime. Patient taking differently: Take 1 tablet by mouth at bedtime as needed for mild constipation.  04/23/17   Virginia Crews, MD  traZODone (DESYREL) 50 MG tablet Take 0.5-1 tablets (25-50 mg total) by mouth at bedtime as needed for sleep. 04/11/19   [place] Bing, DO  cetirizine (ZYRTEC) 5 MG tablet Take 1 tablet (5 mg total) by mouth daily. Take at night time as it can cause some drowsiness 05/24/12 07/09/12  Losq, Burnell Blanks, MD    Family History Family History  Problem Relation Age of Onset   Sickle cell anemia Other    Heart disease Mother    Heart disease Father    Cancer Daughter 74       breast ca   Hypertension Daughter    Cancer Daughter 52       breast ca   Hypertension Daughter    Heart disease Son        Poor circulation-Left Leg   Diabetes Daughter    Hypertension Daughter    Hyperlipidemia Daughter        Poor circulation- Toe amputation    Social History Social History   Tobacco Use   Smoking status: Never Smoker   Smokeless tobacco: Current User    Types: Snuff  Substance Use Topics   Alcohol use: No   Drug use: No     Allergies   Tape and Lisinopril   Review of Systems Review of Systems    Genitourinary: Positive for frequency.  Musculoskeletal: Positive for back pain.  All other  systems reviewed and are negative.    Physical Exam Updated Vital Signs BP (!) 171/72 (BP Location: Right Arm)    Pulse 67    Temp 98.3 F (36.8 C) (Oral)    Resp 16    Ht 5\' 5"  (1.651 m)    Wt 52 kg    SpO2 100%    BMI 19.08 kg/m   Physical Exam Vitals signs and nursing note reviewed.  Constitutional:      Appearance: She is well-developed.     Comments: Non toxic in NAD.  Pleasant.  Awake.  Cooperative.  HENT:     Head: Normocephalic and atraumatic.     Nose: Nose normal.  Eyes:     Conjunctiva/sclera: Conjunctivae normal.  Neck:     Musculoskeletal: Normal range of motion.  Cardiovascular:     Rate and Rhythm: Normal rate and regular rhythm.     Comments: 1+ radial and DP pulses bilaterally. Pulmonary:     Effort: Pulmonary effort is normal.     Breath sounds: Normal breath sounds.  Abdominal:     General: Bowel sounds are normal.     Palpations: Abdomen is soft.     Tenderness: There is no abdominal tenderness.     Comments: No G/R/R. No suprapubic or CVA tenderness. Negative Murphy's and McBurney's. Active BS to lower quadrants.   Musculoskeletal: Normal range of motion.     Comments: TL spine: No midline or paraspinal muscle tenderness.  No wounds or ecchymosis to the back.  Low L-spine/sacrum is nontender. Pelvis: No instability with AP/L compression.  Full range of motion of the hips without pain.  No SI or sciatic notch tenderness.  Negative SLR bilaterally.  Skin:    General: Skin is warm and dry.     Capillary Refill: Capillary refill takes less than 2 seconds.     Comments: No sacral wounds, breakdown, ecchymosis.  Neurological:     Mental Status: She is alert.     Comments: Sensation and strength is intact in upper and lower extremities.  No leg drift.  Patient can hold SLR bilaterally for 5+ seconds.  Psychiatric:        Behavior: Behavior normal.      ED  Treatments / Results  Labs (all labs ordered are listed, but only abnormal results are displayed) Labs Reviewed  URINALYSIS, ROUTINE W REFLEX MICROSCOPIC - Abnormal; Notable for the following components:      Result Value   Hgb urine dipstick SMALL (*)    Protein, ur 30 (*)    Nitrite POSITIVE (*)    Leukocytes,Ua MODERATE (*)    Bacteria, UA RARE (*)    All other components within normal limits  CBC WITH DIFFERENTIAL/PLATELET - Abnormal; Notable for the following components:   Hemoglobin 11.4 (*)    MCV 71.8 (*)    MCH 22.7 (*)    RDW 17.2 (*)    All other components within normal limits  BASIC METABOLIC PANEL - Abnormal; Notable for the following components:   Potassium 3.0 (*)    Glucose, Bld 137 (*)    Calcium 11.1 (*)    GFR calc non Af Amer 54 (*)    All other components within normal limits  URINE CULTURE    EKG None  Radiology Dg Sacrum/coccyx  Result Date: 04/21/2019 CLINICAL DATA:  Low back pain after fall. EXAM: SACRUM AND COCCYX - 2+ VIEW COMPARISON:  03/14/2019 FINDINGS: There is no evidence of fracture or other focal bone lesions.  Degenerative changes in the hips bilaterally and visualized lower lumbar spine. IMPRESSION: No acute bony abnormality. Electronically Signed   By: Rolm Baptise M.D.   On: 04/21/2019 12:36   Ct L-spine No Charge  Result Date: 04/21/2019 CLINICAL DATA:  Back pain since 04/13/2019 EXAM: CT LUMBAR SPINE WITHOUT CONTRAST TECHNIQUE: Multidetector CT imaging of the lumbar spine was performed without intravenous contrast administration. Multiplanar CT image reconstructions were also generated. COMPARISON:  Lumbar spine radiographs 05/13/2016 FINDINGS: Segmentation: There are five lumbar type vertebral bodies. The last full intervertebral disc space is labeled L5-S1. Alignment: Scoliosis and degenerative lumbar spondylosis with multilevel disc disease and facet disease. Mild degenerative retrolisthesis of L2 is noted. Vertebrae: Moderate age related  osteoporosis. There is a remote anterior wedge like compression deformity of L2. No definite acute lumbar spine fractures. The facets are normally aligned. Advanced facet disease but no obvious pars defects. Paraspinal and other soft tissues: No significant paraspinal or retroperitoneal findings. Advanced vascular calcifications are noted but no aortic aneurysm. Disc levels: L1-2: No significant findings. L2-3: Slight bulging uncovered disc but the spinal canal is generous and there is no significant spinal, lateral recess or foraminal stenosis. L3-4: Disc disease and facet disease with a bulging degenerated annulus but no significant spinal, lateral recess or foraminal stenosis. L4-5: Near complete auto interbody fusion at L4-5 with mild osteophytic ridging. There is also fairly significant facet disease, right greater than left contributing to bilateral lateral recess stenosis, right greater than left. No significant foraminal stenosis. L5-S1: Advanced facet disease and moderate disc disease but no significant spinal or foraminal stenosis. IMPRESSION: 1. Scoliosis and degenerative lumbar spondylosis with multilevel disc disease and facet disease. 2. No acute bony findings.  Remote compression deformity of L2. 3. Mild bilateral lateral recess stenosis at L4-5, right greater than left. Electronically Signed   By: Marijo Sanes M.D.   On: 04/21/2019 13:38   Ct Renal Stone Study  Result Date: 04/21/2019 CLINICAL DATA:  85 year old female with history of lower back pain since 04/13/2019. Currently being treated for urinary tract infection. EXAM: CT ABDOMEN AND PELVIS WITHOUT CONTRAST TECHNIQUE: Multidetector CT imaging of the abdomen and pelvis was performed following the standard protocol without IV contrast. COMPARISON:  No priors. FINDINGS: Lower chest: Large hiatal hernia. Atherosclerotic calcifications in the descending thoracic aorta as well as the left anterior descending and right coronary arteries. 3 mm  subpleural nodule in the posterior aspect of the left lower lobe, statistically likely a benign subpleural lymph node. Hepatobiliary: No definite suspicious cystic or solid hepatic lesions are confidently identified on today's noncontrast CT examination. Unenhanced appearance of the gallbladder is normal. Pancreas: No definite pancreatic mass or peripancreatic fluid collections or inflammatory changes noted on today's noncontrast CT examination. Spleen: Unremarkable. Adrenals/Urinary Tract: There are no abnormal calcifications within the collecting system of either kidney, along the course of either ureter, or within the lumen of the urinary bladder. No hydroureteronephrosis or perinephric stranding to suggest urinary tract obstruction at this time. The unenhanced appearance of the kidneys is unremarkable bilaterally. Unenhanced appearance of the urinary bladder is unremarkable. Bilateral adrenal glands are normal in appearance. Stomach/Bowel: Unenhanced appearance of the intra-abdominal portion of the stomach is unremarkable. No pathologic dilatation of small bowel or colon. Normal appendix. Vascular/Lymphatic: Aortic atherosclerosis. No lymphadenopathy noted in the abdomen or pelvis. Reproductive: Uterus and ovaries are atrophic. Small volume of fluid in the vaginal canal. Other: No significant volume of ascites.  No pneumoperitoneum. Musculoskeletal: There are no aggressive appearing  lytic or blastic lesions noted in the visualized portions of the skeleton. IMPRESSION: 1. No acute findings are noted in the abdomen or pelvis to account for the patient's symptoms. Specifically, no urinary tract calculi or findings of urinary tract obstruction are noted at this time. 2. Small volume of fluid in the vaginal canal. This is of uncertain etiology and significance. 3. Large hiatal hernia. 4. Aortic atherosclerosis, in addition to at least 2 vessel coronary artery disease. 5. Additional incidental findings, as above.  Electronically Signed   By: Vinnie Langton M.D.   On: 04/21/2019 13:34    Procedures Procedures (including critical care time)  Medications Ordered in ED Medications  lidocaine (LIDODERM) 5 % 2 patch (2 patches Transdermal Patch Applied 04/21/19 1142)  oxyCODONE-acetaminophen (PERCOCET/ROXICET) 5-325 MG per tablet 1 tablet (1 tablet Oral Given 04/21/19 1130)  acetaminophen (TYLENOL) tablet 650 mg (650 mg Oral Given 04/21/19 1129)  cephALEXin (KEFLEX) capsule 500 mg (500 mg Oral Given 04/21/19 1421)     Initial Impression / Assessment and Plan / ED Course  I have reviewed the triage vital signs and the nursing notes.  Pertinent labs & imaging results that were available during my care of the patient were reviewed by me and considered in my medical decision making (see chart for details).  Clinical Course as of Apr 20 1532  Mon Apr 21, 2019  1250 Nitrite(!): POSITIVE [CG]  1250 Chalmers Guest): MODERATE [CG]  1352 IMPRESSION: 1. Scoliosis and degenerative lumbar spondylosis with multilevel disc disease and facet disease. 2. No acute bony findings. Remote compression deformity of L2. 3. Mild bilateral lateral recess stenosis at L4-5, right greater than left.  CT L-SPINE NO CHARGE [CG]  1353 Negative   DG Sacrum/Coccyx [CG]  1353 IMPRESSION: 1. No acute findings are noted in the abdomen or pelvis to account for the patient's symptoms. Specifically, no urinary tract calculi or findings of urinary tract obstruction are noted at this time. 2. Small volume of fluid in the vaginal canal. This is of uncertain etiology and significance. 3. Large hiatal hernia. 4. Aortic atherosclerosis, in addition to at least 2 vessel coronary artery disease. 5. Additional incidental findings, as above.  CT Renal Stone Study [CG]  1414 Potassium(!): 3.0 [CG]  1414 Creatinine: 0.92 [CG]    Clinical Course User Index [CG] Kinnie Feil, PA-C     85 yo with persistent low back pain after mechanical  fall 5/31.  X-rays on 5/31 reviewed and negative.  No signs of cauda equina.  No associated CP, SOB, abdominal pain, pulse or neuro deficits and I doubt dissection. She reports urinary frequency but no dysuria, hematuria, abdominal pain, flank pain and is afebrile.  She has no CVAT or flank pain, and renal stone is less likely.  Will obtain UA. Given age, will obtain CT lumbar to evaluate for subtle/occult fracture. ddx includes DDD vs soft tissue related back pain vs spasms.    Final Clinical Impressions(s) / ED Diagnoses   1519: Work-up remarkable for K3.0, nitrite positive UTI.  No leukocytosis, fever.  Patient's pain significantly improved in the ER with medicines.  CT lumbar and renal without acute abnormalities, occult lumbar fractures.  Will discharge with antibiotics for UTI, 1 g Tylenol every 6 and oxycodone for breakthrough pain, K supplements for 1 week. Had lengthy discussion with patient about risk of oxycodone, recommended daily MiraLAX to help with chronic constipation.  Return precautions given.  Shared with EDP.  1533: Updated patient's daughter of POC and discharge  plan, she is in agreement.  Final diagnoses:  Back pain  Acute cystitis without hematuria  Hypokalemia    ED Discharge Orders         Ordered    cephALEXin (KEFLEX) 500 MG capsule  2 times daily     04/21/19 1523    oxyCODONE (OXY IR/ROXICODONE) 5 MG immediate release tablet  Every 4 hours PRN     04/21/19 1523    potassium chloride SA (K-DUR) 20 MEQ tablet  2 times daily     04/21/19 1524             Kinnie Feil, [place] 04/21/19 1533    Sherwood Gambler, MD 04/24/19 [id number]

## 2019-04-21 NOTE — ED Notes (Signed)
NURSE Navigator  Spoke to pts daughter and  [name], she states pt lives with her sister [name], told her pt is having labs work and xrays done and that pt may have a UTI, will left her know more later

## 2019-04-21 NOTE — ED Triage Notes (Signed)
PT BIB [facility] from home. Pt complaining of back pain x3 weeks that has gotten worse overnight. Pt experienced a mechanical fall 3 weeks ago and was evaluated here.

## 2019-04-23 LAB — URINE CULTURE: Culture: >=100000 — AB

## 2019-04-24 ENCOUNTER — Telehealth: Payer: Self-pay | Admitting: *Deleted

## 2019-04-24 NOTE — Telephone Encounter (Signed)
Post ED Visit - Positive Culture Follow-up  Culture report reviewed by antimicrobial stewardship pharmacist: [facility] Team []  Elenor Quinones, Pharm.D. []  Heide Guile, Pharm.D., BCPS AQ-ID []  Parks Neptune, Pharm.D., BCPS []  Alycia Rossetti, Pharm.D., BCPS []  [place], Pharm.D., BCPS, AAHIVP []  Legrand Como, Pharm.D., BCPS, AAHIVP []  Salome Arnt, PharmD, BCPS []  Johnnette Gourd, PharmD, BCPS []  Hughes Better, PharmD, BCPS []  Leeroy Cha, PharmD []  Laqueta Linden, PharmD, BCPS []  Albertina Parr, PharmD  [facility] Team []  Leodis Sias, PharmD []  Lindell Spar, PharmD []  Royetta Asal, PharmD []  Graylin Shiver, Rph []  Rema Fendt) Glennon Mac, PharmD []  Arlyn Dunning, PharmD []  Netta Cedars, PharmD []  Dia Sitter, PharmD []  Leone Haven, PharmD []  Gretta Arab, PharmD []  Theodis Shove, PharmD []  Peggyann Juba, PharmD []  Reuel Boom, PharmD Isaias Sakai, PharmD  Positive urine culture Treated with Cephalexin, organism sensitive to the same and no further patient follow-up is required at this time.  Harlon Flor [facility] 04/24/2019, 9:10 AM

## 2019-04-29 ENCOUNTER — Encounter: Payer: Self-pay | Admitting: Podiatry

## 2019-04-29 ENCOUNTER — Other Ambulatory Visit: Payer: Self-pay

## 2019-04-29 ENCOUNTER — Ambulatory Visit (INDEPENDENT_AMBULATORY_CARE_PROVIDER_SITE_OTHER): Payer: Medicare Other | Admitting: Podiatry

## 2019-04-29 VITALS — Temp 97.1°F

## 2019-04-29 DIAGNOSIS — M79674 Pain in right toe(s): Secondary | ICD-10-CM

## 2019-04-29 DIAGNOSIS — I509 Heart failure, unspecified: Secondary | ICD-10-CM | POA: Diagnosis not present

## 2019-04-29 DIAGNOSIS — E1142 Type 2 diabetes mellitus with diabetic polyneuropathy: Secondary | ICD-10-CM | POA: Diagnosis not present

## 2019-04-29 DIAGNOSIS — Z7902 Long term (current) use of antithrombotics/antiplatelets: Secondary | ICD-10-CM | POA: Diagnosis not present

## 2019-04-29 DIAGNOSIS — B351 Tinea unguium: Secondary | ICD-10-CM | POA: Diagnosis not present

## 2019-04-29 DIAGNOSIS — M79675 Pain in left toe(s): Secondary | ICD-10-CM

## 2019-04-29 DIAGNOSIS — I10 Essential (primary) hypertension: Secondary | ICD-10-CM | POA: Diagnosis not present

## 2019-04-29 DIAGNOSIS — Z7189 Other specified counseling: Secondary | ICD-10-CM | POA: Diagnosis not present

## 2019-04-29 DIAGNOSIS — E119 Type 2 diabetes mellitus without complications: Secondary | ICD-10-CM | POA: Diagnosis not present

## 2019-04-29 NOTE — Patient Instructions (Signed)
Diabetes Mellitus and Foot Care Foot care is an important part of your health, especially when you have diabetes. Diabetes may cause you to have problems because of poor blood flow (circulation) to your feet and legs, which can cause your skin to:  Become thinner and drier.  Break more easily.  Heal more slowly.  Peel and crack. You may also have nerve damage (neuropathy) in your legs and feet, causing decreased feeling in them. This means that you may not notice minor injuries to your feet that could lead to more serious problems. Noticing and addressing any potential problems early is the best way to prevent future foot problems. How to care for your feet Foot hygiene  Wash your feet daily with warm water and mild soap. Do not use hot water. Then, pat your feet and the areas between your toes until they are completely dry. Do not soak your feet as this can dry your skin.  Trim your toenails straight across. Do not dig under them or around the cuticle. File the edges of your nails with an emery board or nail file.  Apply a moisturizing lotion or petroleum jelly to the skin on your feet and to dry, brittle toenails. Use lotion that does not contain alcohol and is unscented. Do not apply lotion between your toes. Shoes and socks  Wear clean socks or stockings every day. Make sure they are not too tight. Do not wear knee-high stockings since they may decrease blood flow to your legs.  Wear shoes that fit properly and have enough cushioning. Always look in your shoes before you put them on to be sure there are no objects inside.  To break in new shoes, wear them for just a few hours a day. This prevents injuries on your feet. Wounds, scrapes, corns, and calluses  Check your feet daily for blisters, cuts, bruises, sores, and redness. If you cannot see the bottom of your feet, use a mirror or ask someone for help.  Do not cut corns or calluses or try to remove them with medicine.  If you  find a minor scrape, cut, or break in the skin on your feet, keep it and the skin around it clean and dry. You may clean these areas with mild soap and water. Do not clean the area with peroxide, alcohol, or iodine.  If you have a wound, scrape, corn, or callus on your foot, look at it several times a day to make sure it is healing and not infected. Check for: ? Redness, swelling, or pain. ? Fluid or blood. ? Warmth. ? Pus or a bad smell. General instructions  Do not cross your legs. This may decrease blood flow to your feet.  Do not use heating pads or hot water bottles on your feet. They may burn your skin. If you have lost feeling in your feet or legs, you may not know this is happening until it is too late.  Protect your feet from hot and cold by wearing shoes, such as at the beach or on hot pavement.  Schedule a complete foot exam at least once a year (annually) or more often if you have foot problems. If you have foot problems, report any cuts, sores, or bruises to your health care provider immediately. Contact a health care provider if:  You have a medical condition that increases your risk of infection and you have any cuts, sores, or bruises on your feet.  You have an injury that is not   healing.  You have redness on your legs or feet.  You feel burning or tingling in your legs or feet.  You have pain or cramps in your legs and feet.  Your legs or feet are numb.  Your feet always feel cold.  You have pain around a toenail. Get help right away if:  You have a wound, scrape, corn, or callus on your foot and: ? You have pain, swelling, or redness that gets worse. ? You have fluid or blood coming from the wound, scrape, corn, or callus. ? Your wound, scrape, corn, or callus feels warm to the touch. ? You have pus or a bad smell coming from the wound, scrape, corn, or callus. ? You have a fever. ? You have a red line going up your leg. Summary  Check your feet every day  for cuts, sores, red spots, swelling, and blisters.  Moisturize feet and legs daily.  Wear shoes that fit properly and have enough cushioning.  If you have foot problems, report any cuts, sores, or bruises to your health care provider immediately.  Schedule a complete foot exam at least once a year (annually) or more often if you have foot problems. This information is not intended to replace advice given to you by your health care provider. Make sure you discuss any questions you have with your health care provider. Document Released: 10/27/2000 Document Revised: 12/12/2017 Document Reviewed: 12/01/2016 Elsevier Interactive Patient Education  2019 [employer].  Onychomycosis/Fungal Toenails  WHAT IS IT? An infection that lies within the keratin of your nail plate that is caused by a fungus.  WHY ME? Fungal infections affect all ages, sexes, races, and creeds.  There may be many factors that predispose you to a fungal infection such as age, coexisting medical conditions such as diabetes, or an autoimmune disease; stress, medications, fatigue, genetics, etc.  Bottom line: fungus thrives in a warm, moist environment and your shoes offer such a location.  IS IT CONTAGIOUS? Theoretically, yes.  You do not want to share shoes, nail clippers or files with someone who has fungal toenails.  Walking around barefoot in the same room or sleeping in the same bed is unlikely to transfer the organism.  It is important to realize, however, that fungus can spread easily from one nail to the next on the same foot.  HOW DO WE TREAT THIS?  There are several ways to treat this condition.  Treatment may depend on many factors such as age, medications, pregnancy, liver and kidney conditions, etc.  It is best to ask your doctor which options are available to you.  1. No treatment.   Unlike many other medical concerns, you can live with this condition.  However for many people this can be a painful condition and  may lead to ingrown toenails or a bacterial infection.  It is recommended that you keep the nails cut short to help reduce the amount of fungal nail. 2. Topical treatment.  These range from herbal remedies to prescription strength nail lacquers.  About 40-50% effective, topicals require twice daily application for approximately 9 to 12 months or until an entirely new nail has grown out.  The most effective topicals are medical grade medications available through physicians offices. 3. Oral antifungal medications.  With an 80-90% cure rate, the most common oral medication requires 3 to 4 months of therapy and stays in your system for a year as the new nail grows out.  Oral antifungal medications do require   blood work to make sure it is a safe drug for you.  A liver function panel will be performed prior to starting the medication and after the first month of treatment.  It is important to have the blood work performed to avoid any harmful side effects.  In general, this medication safe but blood work is required. 4. Laser Therapy.  This treatment is performed by applying a specialized laser to the affected nail plate.  This therapy is noninvasive, fast, and non-painful.  It is not covered by insurance and is therefore, out of pocket.  The results have been very good with a 80-95% cure rate.  The [facility] is the only practice in the area to offer this therapy. 5. Permanent Nail Avulsion.  Removing the entire nail so that a new nail will not grow back. 

## 2019-04-30 ENCOUNTER — Other Ambulatory Visit: Payer: Self-pay | Admitting: Family Medicine

## 2019-05-02 ENCOUNTER — Other Ambulatory Visit: Payer: Self-pay

## 2019-05-02 ENCOUNTER — Emergency Department (HOSPITAL_COMMUNITY)
Admission: EM | Admit: 2019-05-02 | Discharge: 2019-05-02 | Disposition: A | Discharge status: Home / Self Care | Payer: Medicare Other | Attending: Emergency Medicine | Admitting: Emergency Medicine

## 2019-05-02 ENCOUNTER — Emergency Department (HOSPITAL_COMMUNITY): Payer: Medicare Other

## 2019-05-02 ENCOUNTER — Encounter (HOSPITAL_COMMUNITY): Payer: Self-pay | Admitting: Emergency Medicine

## 2019-05-02 DIAGNOSIS — Z79899 Other long term (current) drug therapy: Secondary | ICD-10-CM | POA: Diagnosis not present

## 2019-05-02 DIAGNOSIS — E1151 Type 2 diabetes mellitus with diabetic peripheral angiopathy without gangrene: Secondary | ICD-10-CM | POA: Insufficient documentation

## 2019-05-02 DIAGNOSIS — J209 Acute bronchitis, unspecified: Secondary | ICD-10-CM | POA: Diagnosis not present

## 2019-05-02 DIAGNOSIS — I2510 Atherosclerotic heart disease of native coronary artery without angina pectoris: Secondary | ICD-10-CM | POA: Diagnosis not present

## 2019-05-02 DIAGNOSIS — I110 Hypertensive heart disease with heart failure: Secondary | ICD-10-CM | POA: Diagnosis not present

## 2019-05-02 DIAGNOSIS — Z7982 Long term (current) use of aspirin: Secondary | ICD-10-CM | POA: Diagnosis not present

## 2019-05-02 DIAGNOSIS — R05 Cough: Secondary | ICD-10-CM | POA: Diagnosis not present

## 2019-05-02 DIAGNOSIS — F0390 Unspecified dementia without behavioral disturbance: Secondary | ICD-10-CM | POA: Insufficient documentation

## 2019-05-02 DIAGNOSIS — Z20828 Contact with and (suspected) exposure to other viral communicable diseases: Secondary | ICD-10-CM | POA: Insufficient documentation

## 2019-05-02 DIAGNOSIS — I10 Essential (primary) hypertension: Secondary | ICD-10-CM | POA: Diagnosis not present

## 2019-05-02 DIAGNOSIS — R404 Transient alteration of awareness: Secondary | ICD-10-CM | POA: Diagnosis not present

## 2019-05-02 DIAGNOSIS — I252 Old myocardial infarction: Secondary | ICD-10-CM | POA: Insufficient documentation

## 2019-05-02 DIAGNOSIS — I5020 Unspecified systolic (congestive) heart failure: Secondary | ICD-10-CM | POA: Insufficient documentation

## 2019-05-02 DIAGNOSIS — E1142 Type 2 diabetes mellitus with diabetic polyneuropathy: Secondary | ICD-10-CM | POA: Diagnosis not present

## 2019-05-02 DIAGNOSIS — R0602 Shortness of breath: Secondary | ICD-10-CM | POA: Diagnosis not present

## 2019-05-02 DIAGNOSIS — R079 Chest pain, unspecified: Secondary | ICD-10-CM | POA: Diagnosis not present

## 2019-05-02 LAB — BASIC METABOLIC PANEL
Anion gap: 7 (ref 5–15)
BUN: 13 mg/dL (ref 8–23)
CO2: 22 mmol/L (ref 22–32)
Calcium: 11.2 mg/dL — ABNORMAL HIGH (ref 8.9–10.3)
Chloride: 108 mmol/L (ref 98–111)
Creatinine, Ser: 1.15 mg/dL — ABNORMAL HIGH (ref 0.44–1.00)
GFR calc Af Amer: 47 mL/min — ABNORMAL LOW (ref >60)
GFR calc non Af Amer: 41 mL/min — ABNORMAL LOW (ref >60)
Glucose, Bld: 156 mg/dL — ABNORMAL HIGH (ref 70–99)
Potassium: 4.4 mmol/L (ref 3.5–5.1)
Sodium: 137 mmol/L (ref 135–145)

## 2019-05-02 LAB — CBC
HCT: 36.5 % (ref 36.0–46.0)
Hemoglobin: 11 g/dL — ABNORMAL LOW (ref 12.0–15.0)
MCH: 22.5 pg — ABNORMAL LOW (ref 26.0–34.0)
MCHC: 30.1 g/dL (ref 30.0–36.0)
MCV: 74.8 fL — ABNORMAL LOW (ref 80.0–100.0)
Platelets: 175 10*3/uL (ref 150–400)
RBC: 4.88 MIL/uL (ref 3.87–5.11)
RDW: 17.5 % — ABNORMAL HIGH (ref 11.5–15.5)
WBC: 5.3 10*3/uL (ref 4.0–10.5)
nRBC: 0 % (ref 0.0–0.2)

## 2019-05-02 LAB — TROPONIN I: Troponin I: <0.03 ng/mL (ref <0.03)

## 2019-05-02 MED ORDER — DOXYCYCLINE HYCLATE 100 MG PO CAPS: 100.0000 mg | ORAL_CAPSULE | Freq: Two times a day (BID) | ORAL | 0 refills | Status: DC

## 2019-05-02 MED ORDER — DOXYCYCLINE HYCLATE 100 MG PO TABS
100.0000 mg | ORAL_TABLET | Freq: Once | ORAL | Status: AC
  Administered 2019-05-02: 100 mg via ORAL
  Filled 2019-05-02: qty 1

## 2019-05-02 NOTE — ED Notes (Signed)
Confirmed with pt's daughter, [name], that she will be picking up pt in front of ED lobby

## 2019-05-02 NOTE — ED Provider Notes (Signed)
[facility] EMERGENCY DEPARTMENT Provider Note   CSN: [id number] Arrival date & time: 05/02/19  1510    History   Chief Complaint Chief Complaint  Patient presents with   Chest Pain    HPI [name] is a 85 y.o. female.     Pt presents to the ED today with cough.  EMS said they were called to the house by pt's daughter for cp.  Pt denies every having cp.  She said the cough has been going on for 1 week.  She denies any known covid exposures and has been staying at home.  Pt denies f/c. Pt did take asa pta at home.     Past Medical History:  Diagnosis Date   Allergy    Anemia    Iron def anemia   Arthritis    Asthma    Breast cancer ([place]) 10/2010   Invasive Ductal Carcinoma, s/p bilateral mastectomy, now on Tamoxifen. Followed by Dr Jamse Arn.    Breast cancer ([place]) 2011   s/p Bilateral masectomy   CAD (coronary artery disease)    Carotid artery aneurysm ([place]) 08/2010   right ICA, 5 x 5mm   CHF (congestive heart failure) ([place])    Systolic    Colon cancer ([place])    Colon cancer ([place]) 12/11/2013   DDD (degenerative disc disease), cervical    Diabetes mellitus    Diverticulosis    GERD (gastroesophageal reflux disease)    H/O: GI bleed    Hiatal hernia    History of colon cancer    Hypercalcemia 06/09/2014   Hyperlipidemia    Hypertension    Myocardial infarction ([place])    Neuropathy    Pneumonia 03/2012   Pressure injury of heel, stage 2 ([place]) 08/31/2016   Shortness of breath     Patient Active Problem List   Diagnosis Date Noted   Constipation 01/29/2019   Chronic breast pain 12/26/2018   Pain in gums 12/26/2018   History of deep vein thrombosis 10/17/2018   Severe protein-calorie malnutrition [place]: less than 60% of standard weight) ([place]) 10/17/2018   Disorder associated with well-controlled type 2 diabetes mellitus ([place]) 10/17/2018   History of pulmonary embolus (PE) 10/17/2018   Hypomagnesemia  08/27/2018   Left ventricular hypertrophy by electrocardiogram 08/27/2018   Atypical chest pain 08/26/2018   Advanced nonexudative age-related macular degeneration of both eyes without subfoveal involvement 07/30/2018   Altered mental status 06/06/2018   Microcytic anemia    TIA (transient ischemic attack) 06/03/2018   Mobility impaired 04/26/2018   Protein-calorie malnutrition, severe 02/06/2017   Urinary tract infection without hematuria 02/03/2017   Hypokalemia 10/13/2016   Pressure injury of heel, stage 2 ([place]) 08/31/2016   Pulmonary embolus ([place]) 08/30/2016   Dementia without behavioral disturbance ([place]) 06/01/2016   Depression 01/20/2016   Suicidal ideation 01/19/2016   Prolapse of female pelvic organs 12/28/2015   Leg weakness, bilateral 12/08/2015   Weakness of right lower extremity 11/25/2015   Dry eyes 10/28/2015   (HFpEF) heart failure with preserved ejection fraction ([place])    Gout 05/14/2015   Delirium secondary to multiple medical problems 04/15/2015   Primary hypertension    Seasonal allergies 02/10/2015   Stroke-like symptoms    Stroke-like episode ([place]) 01/25/2015   Syncope and collapse 01/25/2015   Dizziness 09/19/2014   Anemia of chronic illness 08/19/2014   Acute superficial venous thrombosis of right lower extremity 08/19/2014   Hypercalcemia 06/09/2014   Breast cancer, left breast ([place]) 06/09/2014  Breast cancer, right breast ([place]) 06/09/2014   Edema of left lower extremity 03/30/2014   PAD (peripheral artery disease) ([place]) 03/30/2014   History of colon cancer 12/11/2013   History of fall 05/15/2013   Stricture and stenosis of esophagus 10/27/2012   Thoracic back pain 05/24/2012   Productive cough 04/30/2012   Tobacco use disorder 06/02/2011   Post-mastectomy pain 05/02/2011   Hiatal hernia 03/22/2011   Vertebrobasilar artery syndrome 08/22/2010   IDA (iron deficiency anemia) 04/29/2007   Controlled type  2 diabetes mellitus with diabetic neuropathy, without long-term current use of insulin ([place]) 01/10/2007   Coronary atherosclerosis 01/10/2007   Asthma 01/10/2007   Reflux esophagitis 01/10/2007   Osteoarthritis, multiple sites 01/10/2007    Past Surgical History:  Procedure Laterality Date   Breast masectomy  10/2010   Bilateral masectomy by Dr Lucia Gaskins s/p invasive ductal carcinoma.   BREAST SURGERY     Cardiac  Catherization  2007   Severe 3-vessel disease.  EF 20-25%.   ESOPHAGOGASTRODUODENOSCOPY  2001   Esophageal Tear   ESOPHAGOGASTRODUODENOSCOPY  10/27/2012   Procedure: ESOPHAGOGASTRODUODENOSCOPY (EGD);  Surgeon: Irene Shipper, MD;  Location: [facility] ENDOSCOPY;  Service: Endoscopy;  Laterality: N/A;  pat   JOINT REPLACEMENT Right 2001   Knee   TOTAL KNEE ARTHROPLASTY  2001     OB History   No obstetric history on file.      Home Medications    Prior to Admission medications   Medication Sig Start Date End Date Taking? Authorizing Provider  acetaminophen (TYLENOL) 500 MG tablet Take 500 mg by mouth every 6 (six) hours as needed for moderate pain.     [provider]  albuterol (PROVENTIL HFA;VENTOLIN HFA) 108 (90 BASE) MCG/ACT inhaler Inhale 2 puffs into the lungs every 6 (six) hours as needed. For shortness of breath 01/18/15   [name], MD  Amino Acids-Protein Hydrolys (FEEDING SUPPLEMENT, PRO-STAT SUGAR FREE 64,) LIQD Take 30 mLs by mouth 2 (two) times daily with a meal. ensure 01/25/19   [name], MD  amLODipine (NORVASC) 5 MG tablet Take 1 tablet (5 mg total) by mouth daily. 02/14/19   [place] Bing, DO  aspirin EC 325 MG tablet Take 1 tablet (325 mg total) by mouth daily. 07/05/18   [place] Bing, DO  carvedilol (COREG) 12.5 MG tablet Take 1 tablet (12.5 mg total) by mouth 2 (two) times daily with a meal. 02/06/19   [place] Bing, DO  diclofenac sodium (VOLTAREN) 1 % GEL Apply 2 g topically 4 (four) times daily. 08/29/18   Meccariello,  Bernita Raisin, DO  donepezil (ARICEPT) 5 MG tablet Take 1 tablet (5 mg total) by mouth at bedtime. 04/11/19   [place] Bing, DO  doxycycline (VIBRAMYCIN) 100 MG capsule Take 1 capsule (100 mg total) by mouth 2 (two) times daily. 05/02/19   Isla Pence, MD  fluticasone (FLONASE) 50 MCG/ACT nasal spray PLACE 1 SPRAY INTO BOTH NOSTRILS DAILY. 04/30/19 04/29/20  [place] Bing, DO  HYDROcodone-acetaminophen (NORCO/VICODIN) 5-325 MG tablet Take 2 tablets by mouth every 6 (six) hours as needed for severe pain. 04/13/19   Albrizze, Kaitlyn E, PA-C  loratadine (CLARITIN) 10 MG tablet TAKE 1/2 TABLET BY MOUTH DAILY Patient taking differently: Take 5 mg by mouth daily as needed for allergies or rhinitis.  12/08/15   [name], MD  losartan (COZAAR) 100 MG tablet Take 100 mg by mouth daily. 03/17/19   [provider]  losartan (COZAAR) 50 MG tablet  Take 1 tablet (50 mg total) by mouth daily. Patient taking differently: Take 50-100 mg by mouth See admin instructions. Take 100 mg by mouth in the morning and 50 mg in the evening 07/23/18   [place] Bing, DO  pantoprazole (PROTONIX) 40 MG tablet TAKE 1 TABLET BY MOUTH EVERY DAY Patient taking differently: Take 40 mg by mouth daily.  10/14/18   [place] Bing, DO  polyethylene glycol (MIRALAX / GLYCOLAX) packet Take 17 g by mouth daily. 01/25/19   [name], MD  potassium chloride SA (K-DUR) 20 MEQ tablet Take 1 tablet (20 mEq total) by mouth 2 (two) times daily for 7 days. 04/21/19 04/28/19  Kinnie Feil, PA-C  ranitidine (ZANTAC) 150 MG tablet TAKE 1 TABLET BY MOUTH TWICE A DAY 07/29/18   [place] Bing, DO  rosuvastatin (CRESTOR) 20 MG tablet TAKE 1 TABLET BY MOUTH EVERYDAY AT BEDTIME Patient taking differently: Take 20 mg by mouth at bedtime.  09/09/18   [place] Bing, DO  senna-docusate (SENOKOT-S) 8.6-50 MG tablet Take 2 tablets by mouth at bedtime. Patient taking differently: Take 1 tablet by mouth at bedtime as needed for  mild constipation.  04/23/17   [place] Crews, MD  traZODone (DESYREL) 50 MG tablet Take 0.5-1 tablets (25-50 mg total) by mouth at bedtime as needed for sleep. 04/11/19   [place] Bing, DO  cetirizine (ZYRTEC) 5 MG tablet Take 1 tablet (5 mg total) by mouth daily. Take at night time as it can cause some drowsiness 05/24/12 07/09/12  [place], Burnell Blanks, MD    Family History Family History  Problem Relation Age of Onset   Sickle cell anemia Other    Heart disease Mother    Heart disease Father    Cancer Daughter 41       breast ca   Hypertension Daughter    Cancer Daughter 97       breast ca   Hypertension Daughter    Heart disease Son        Poor circulation-Left Leg   Diabetes Daughter    Hypertension Daughter    Hyperlipidemia Daughter        Poor circulation- Toe amputation    Social History Social History   Tobacco Use   Smoking status: Never Smoker   Smokeless tobacco: Current User    Types: Snuff  Substance Use Topics   Alcohol use: No   Drug use: No     Allergies   Tape and Lisinopril   Review of Systems Review of Systems  Respiratory: Positive for cough.   All other systems reviewed and are negative.    Physical Exam Updated Vital Signs BP 140/75    Pulse 68    Temp 98.6 F (37 C) (Oral)    Resp 18    Ht 5\' 5"  (1.651 m)    Wt 52 kg    SpO2 100%    BMI 19.08 kg/m   Physical Exam Vitals signs and nursing note reviewed.  Constitutional:      Appearance: She is well-developed.  HENT:     Head: Normocephalic and atraumatic.  Eyes:     Extraocular Movements: Extraocular movements intact.     Pupils: Pupils are equal, round, and reactive to light.  Neck:     Musculoskeletal: Normal range of motion and neck supple.  Cardiovascular:     Rate and Rhythm: Normal rate and regular rhythm.     Heart sounds: Normal heart sounds.  Pulmonary:  Effort: Pulmonary effort is normal.     Breath sounds: Normal breath sounds.  Abdominal:       General: Bowel sounds are normal.     Palpations: Abdomen is soft.  Musculoskeletal: Normal range of motion.  Skin:    General: Skin is warm.     Capillary Refill: Capillary refill takes less than 2 seconds.  Neurological:     General: No focal deficit present.     Mental Status: She is alert and oriented to person, place, and time.  Psychiatric:        Mood and Affect: Mood normal.        Behavior: Behavior normal.      ED Treatments / Results  Labs (all labs ordered are listed, but only abnormal results are displayed) Labs Reviewed  BASIC METABOLIC PANEL - Abnormal; Notable for the following components:      Result Value   Glucose, Bld 156 (*)    Creatinine, Ser 1.15 (*)    Calcium 11.2 (*)    GFR calc non Af Amer 41 (*)    GFR calc Af Amer 47 (*)    All other components within normal limits  CBC - Abnormal; Notable for the following components:   Hemoglobin 11.0 (*)    MCV 74.8 (*)    MCH 22.5 (*)    RDW 17.5 (*)    All other components within normal limits  NOVEL CORONAVIRUS, NAA (HOSPITAL ORDER, SEND-OUT TO REF LAB)  TROPONIN I    EKG EKG Interpretation  Date/Time:  Friday May 02 2019 15:16:44 EDT Ventricular Rate:  69 PR Interval:    QRS Duration: 77 QT Interval:  382 QTC Calculation: 410 R Axis:   10 Text Interpretation:  Sinus rhythm Anteroseptal infarct, age indeterminate No significant change since last tracing Confirmed by Isla Pence [phone]) on 05/02/2019 4:05:54 PM   Radiology Dg Chest Port 1 View  Result Date: 05/02/2019 CLINICAL DATA:  Chest pain today and cough for the past week. EXAM: PORTABLE CHEST 1 VIEW COMPARISON:  01/24/2019 and 11/27/2018. FINDINGS: Normal sized heart. Tortuous and partially calcified thoracic aorta. Interval somewhat linear nodular density in the left lower lung zone in similar interval density in the right upper lung zone. The latter is partially obscured by an overlying cardiac lead. Small amount of interval  curvilinear atelectasis or scarring at the medial right lung base. Diffuse osteopenia. Left rotator cuff calcifications. Superior migration of the right humeral head with some bony remodeling. Thoracic spine degenerative changes. IMPRESSION: 1. Interval somewhat linear nodular density in the left lower lung zone and similar interval density in the right upper lung zone. Follow-up PA and lateral chest radiographs are recommended when possible. 2. Interval curvilinear atelectasis or scarring at the medial right lung base. 3. Left rotator cuff calcific tendinitis and chronic right rotator cuff tear. Electronically Signed   By: Claudie Revering M.D.   On: 05/02/2019 16:08    Procedures Procedures (including critical care time)  Medications Ordered in ED Medications  doxycycline (VIBRA-TABS) tablet 100 mg (has no administration in time range)     Initial Impression / Assessment and Plan / ED Course  I have reviewed the triage vital signs and the nursing notes.  Pertinent labs & imaging results that were available during my care of the patient were reviewed by me and considered in my medical decision making (see chart for details).       Due to cough for 1 week, I will put her  on doxy for bronchitis.  Covid test is pending.  The pt was d/w her daughters who are comfortable taking her home.  The pt requests to speak with the SW regarding placement to an assisted living facility.  Case management, Mariann Laster, spoke with pt and daughters.  For now, we will arrange home health.  Pt is stable for d/c.  Return if worse.  F/u with pcp.   Final Clinical Impressions(s) / ED Diagnoses   Final diagnoses:  Acute bronchitis, unspecified organism    ED Discharge Orders         Ordered    doxycycline (VIBRAMYCIN) 100 MG capsule  2 times daily     05/02/19 1810           Isla Pence, MD 05/02/19 1828

## 2019-05-02 NOTE — ED Notes (Signed)
Patient verbalizes understanding of discharge instructions. Opportunity for questioning and answers were provided. Pt discharged from ED. 

## 2019-05-02 NOTE — ED Triage Notes (Signed)
Pt arrives via EMS from home with reports of CP per daughter. 324 ASA given by family. Pt denies any CP, pain is per daughter. Endorses cough x1 week.

## 2019-05-03 LAB — NOVEL CORONAVIRUS, NAA (HOSP ORDER, SEND-OUT TO REF LAB; TAT 18-24 HRS): SARS-CoV-2, NAA: NOT DETECTED

## 2019-05-05 DIAGNOSIS — H353130 Nonexudative age-related macular degeneration, bilateral, stage unspecified: Secondary | ICD-10-CM | POA: Diagnosis not present

## 2019-05-05 DIAGNOSIS — E785 Hyperlipidemia, unspecified: Secondary | ICD-10-CM | POA: Diagnosis not present

## 2019-05-05 DIAGNOSIS — Z8701 Personal history of pneumonia (recurrent): Secondary | ICD-10-CM | POA: Diagnosis not present

## 2019-05-05 DIAGNOSIS — E1140 Type 2 diabetes mellitus with diabetic neuropathy, unspecified: Secondary | ICD-10-CM | POA: Diagnosis not present

## 2019-05-05 DIAGNOSIS — Z86711 Personal history of pulmonary embolism: Secondary | ICD-10-CM | POA: Diagnosis not present

## 2019-05-05 DIAGNOSIS — F17290 Nicotine dependence, other tobacco product, uncomplicated: Secondary | ICD-10-CM | POA: Diagnosis not present

## 2019-05-05 DIAGNOSIS — Z9013 Acquired absence of bilateral breasts and nipples: Secondary | ICD-10-CM | POA: Diagnosis not present

## 2019-05-05 DIAGNOSIS — I110 Hypertensive heart disease with heart failure: Secondary | ICD-10-CM | POA: Diagnosis not present

## 2019-05-05 DIAGNOSIS — Z85038 Personal history of other malignant neoplasm of large intestine: Secondary | ICD-10-CM | POA: Diagnosis not present

## 2019-05-05 DIAGNOSIS — F329 Major depressive disorder, single episode, unspecified: Secondary | ICD-10-CM | POA: Diagnosis not present

## 2019-05-05 DIAGNOSIS — E1151 Type 2 diabetes mellitus with diabetic peripheral angiopathy without gangrene: Secondary | ICD-10-CM | POA: Diagnosis not present

## 2019-05-05 DIAGNOSIS — I2510 Atherosclerotic heart disease of native coronary artery without angina pectoris: Secondary | ICD-10-CM | POA: Diagnosis not present

## 2019-05-05 DIAGNOSIS — J45909 Unspecified asthma, uncomplicated: Secondary | ICD-10-CM | POA: Diagnosis not present

## 2019-05-05 DIAGNOSIS — D638 Anemia in other chronic diseases classified elsewhere: Secondary | ICD-10-CM | POA: Diagnosis not present

## 2019-05-05 DIAGNOSIS — I5020 Unspecified systolic (congestive) heart failure: Secondary | ICD-10-CM | POA: Diagnosis not present

## 2019-05-05 DIAGNOSIS — M109 Gout, unspecified: Secondary | ICD-10-CM | POA: Diagnosis not present

## 2019-05-05 DIAGNOSIS — M5030 Other cervical disc degeneration, unspecified cervical region: Secondary | ICD-10-CM | POA: Diagnosis not present

## 2019-05-05 DIAGNOSIS — K219 Gastro-esophageal reflux disease without esophagitis: Secondary | ICD-10-CM | POA: Diagnosis not present

## 2019-05-05 DIAGNOSIS — K5790 Diverticulosis of intestine, part unspecified, without perforation or abscess without bleeding: Secondary | ICD-10-CM | POA: Diagnosis not present

## 2019-05-05 DIAGNOSIS — Z853 Personal history of malignant neoplasm of breast: Secondary | ICD-10-CM | POA: Diagnosis not present

## 2019-05-05 DIAGNOSIS — D649 Anemia, unspecified: Secondary | ICD-10-CM | POA: Diagnosis not present

## 2019-05-05 DIAGNOSIS — Z86718 Personal history of other venous thrombosis and embolism: Secondary | ICD-10-CM | POA: Diagnosis not present

## 2019-05-05 DIAGNOSIS — Z681 Body mass index (BMI) 19 or less, adult: Secondary | ICD-10-CM | POA: Diagnosis not present

## 2019-05-05 DIAGNOSIS — Z9181 History of falling: Secondary | ICD-10-CM | POA: Diagnosis not present

## 2019-05-05 DIAGNOSIS — F0390 Unspecified dementia without behavioral disturbance: Secondary | ICD-10-CM | POA: Diagnosis not present

## 2019-05-05 IMAGING — CR PELVIS - 1-2 VIEW
1 series · 1 of 1 positions shown · non-contrast
Comparison: 05/13/2016

CLINICAL DATA: Fall, left leg pain

EXAM:
PELVIS - 1-2 VIEW

[pelvis ap]
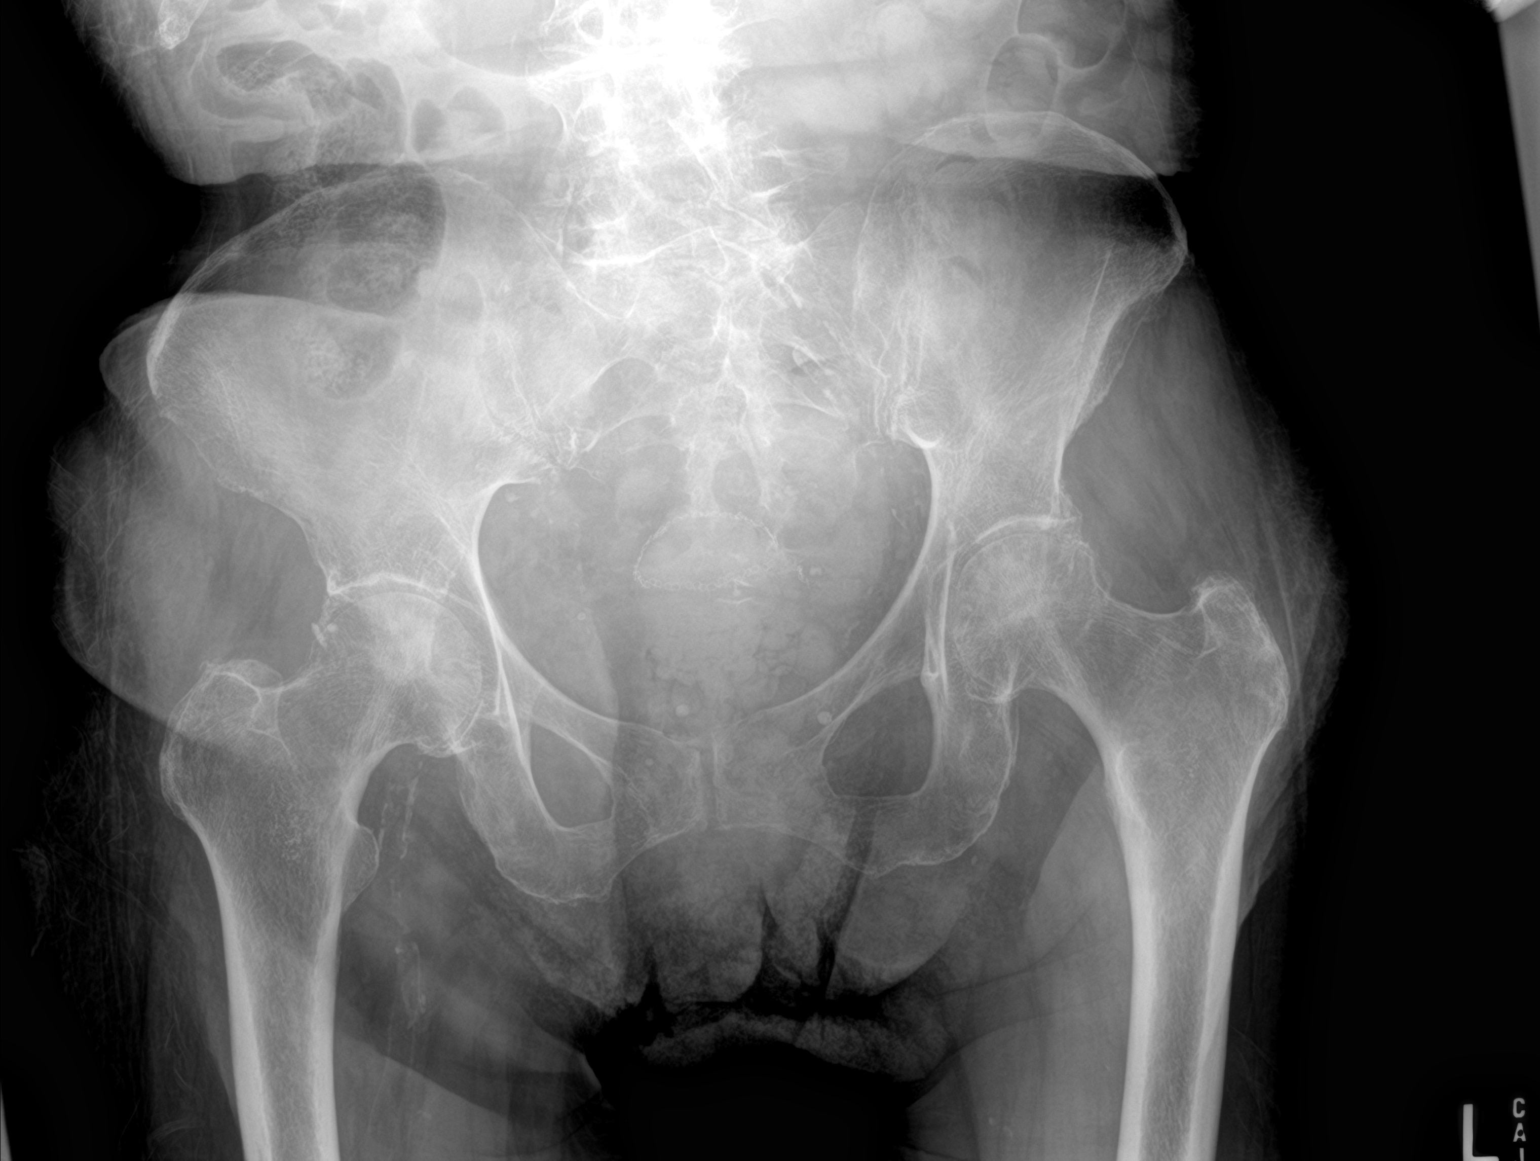

[1 of 1 positions shown; findings below may reference images not displayed]

FINDINGS: Mild symmetric osteoarthritic changes in the hips bilaterally with
joint space narrowing and spurring. SI joints symmetric and
unremarkable. No acute bony abnormality. Specifically, no fracture,
subluxation, or dislocation.
IMPRESSION: No acute bony abnormality.

## 2019-05-05 IMAGING — CT CT CERVICAL SPINE WITHOUT CONTRAST
5 of 7 series · 15 of 33 positions shown, 16 images · non-contrast
Comparison: 06/06/2018

CLINICAL DATA: Head trauma, headache

EXAM:
CT HEAD WITHOUT CONTRAST
CT CERVICAL SPINE WITHOUT CONTRAST
TECHNIQUE: Multidetector CT imaging of the head and cervical spine was
performed following the standard protocol without intravenous
contrast. Multiplanar CT image reconstructions of the cervical spine
were also generated.

[Series 5: head bone · axial · 0.43mm/px · z∈[-88,-38]mm · 2 of 77 slices shown]
[im 26/77  bone]
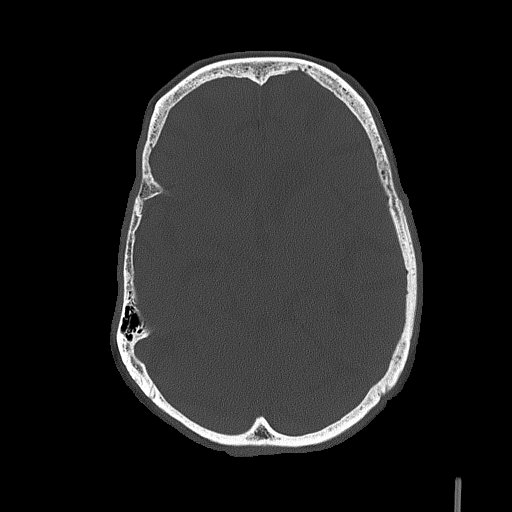
[im 51/77  bone]
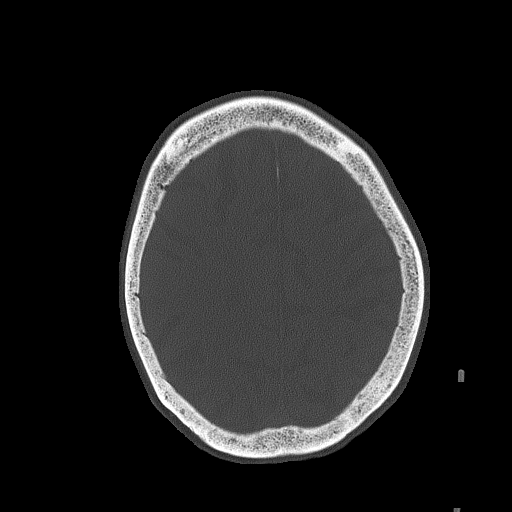

[Series 6: head without cor · coronal · non-contrast · 0.29mm/px · 3 of 67 slices shown]
[im 20/67  bone]
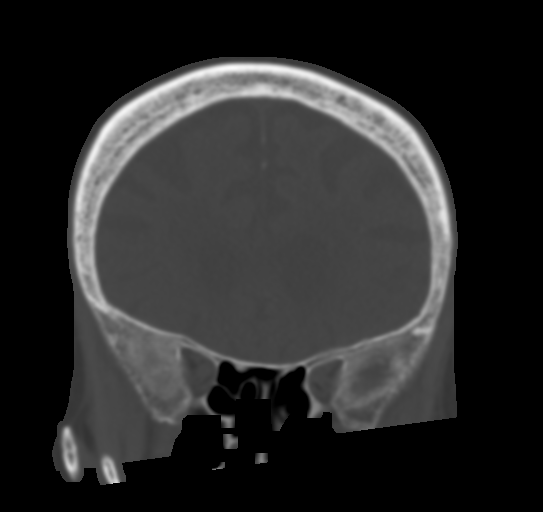
[im 29/67  bone]
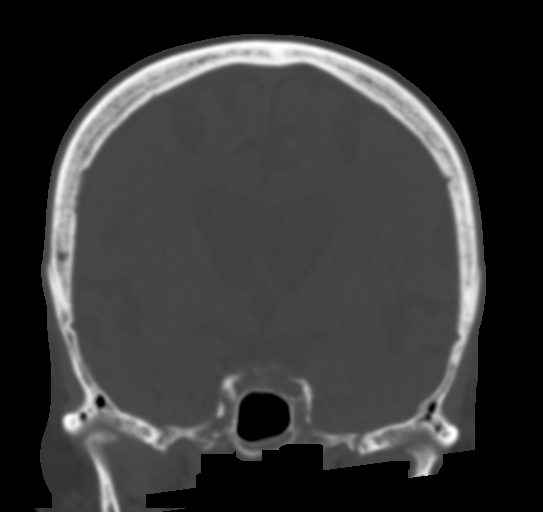
[im 38/67  bone]
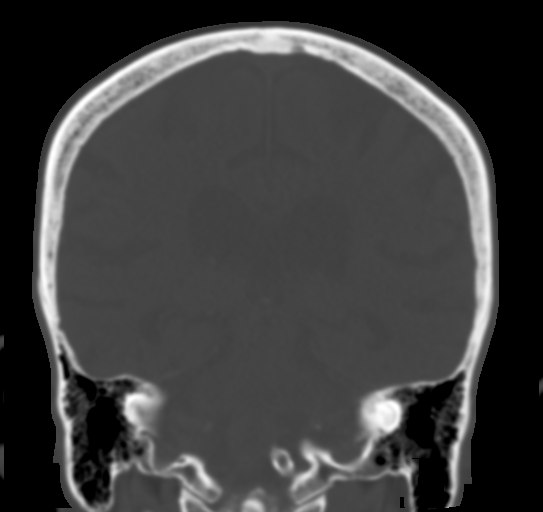

[Series 8: c_spine 2.0 st · axial · 0.35mm/px · z∈[-202,-152]mm · 2 of 75 slices shown]
[im 25/75  bone]
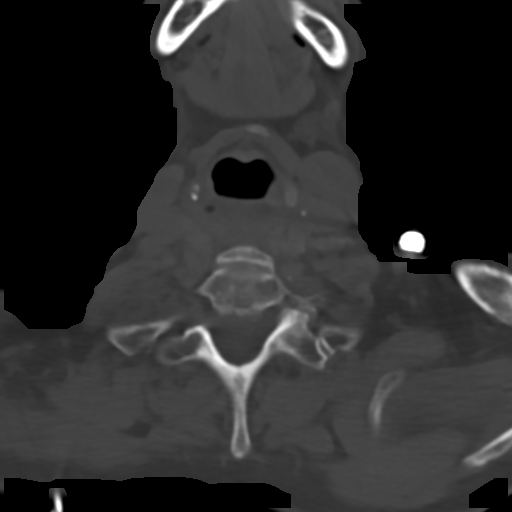
[im 50/75  bone]
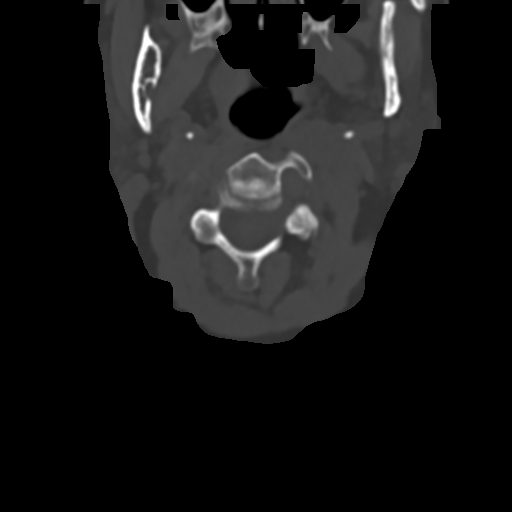

[Series 10: c_spine 2.0 sag bone · sagittal · 0.23mm/px · 5 of 61 slices shown]
[im 11/61  bone]
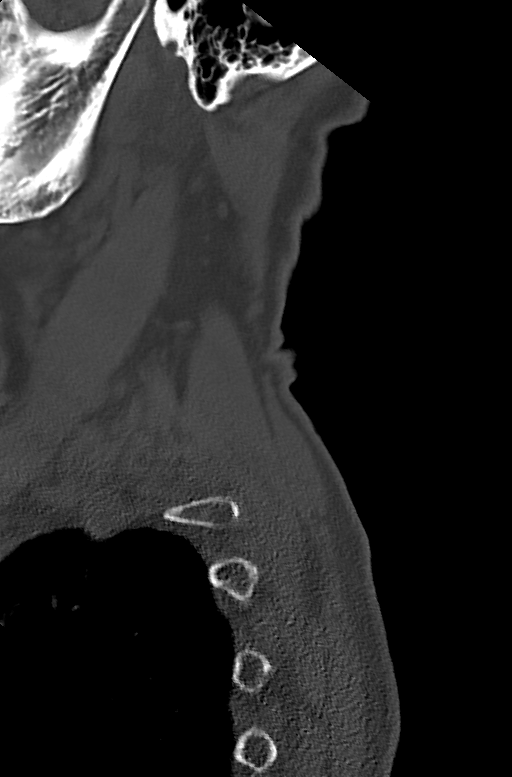
[im 21/61  bone]
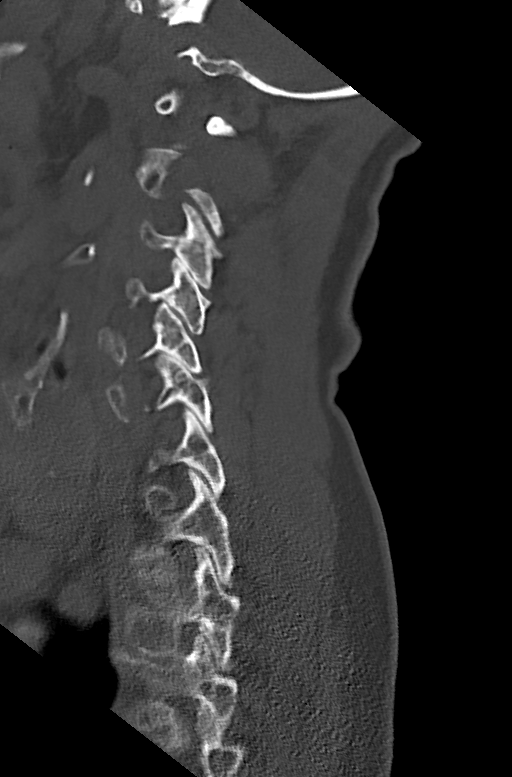
[im 31/61  bone]
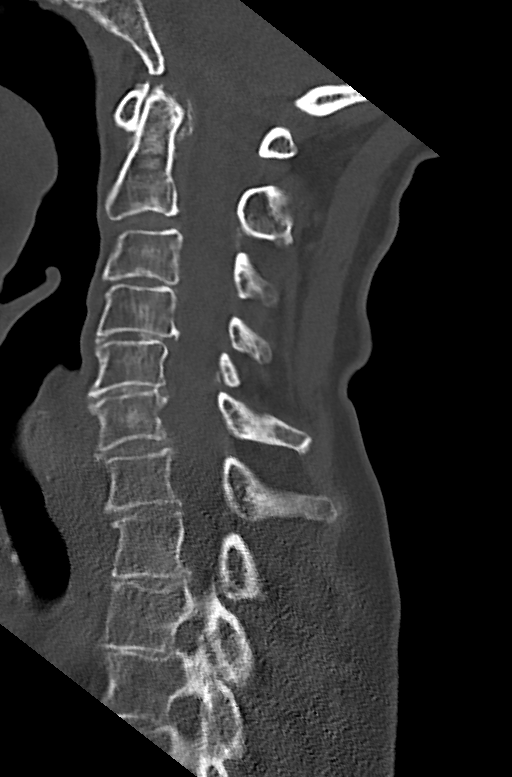
[im 41/61  bone]
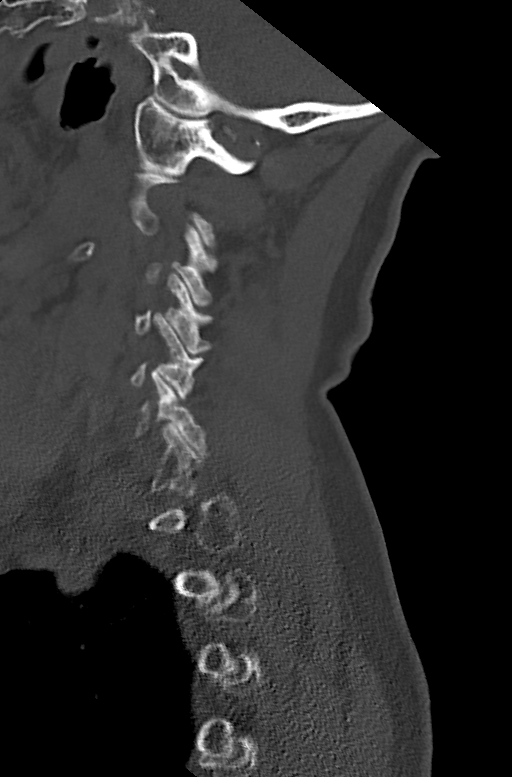
[im 51/61  bone]
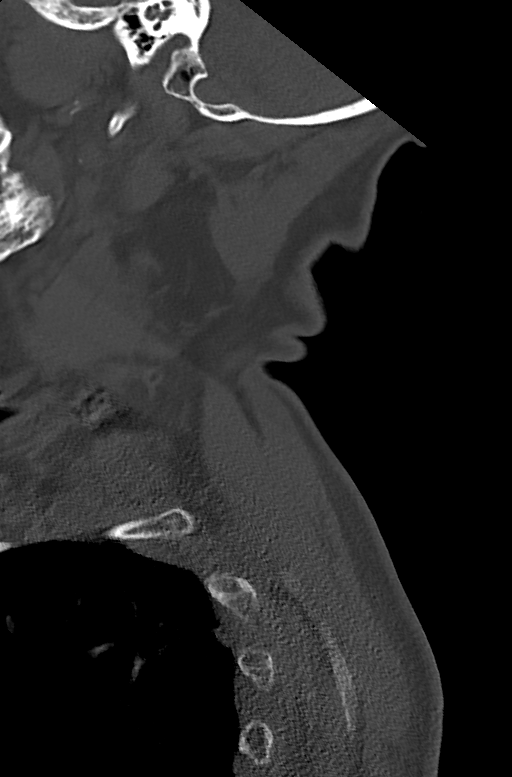

[Series 12: c_spine 2.0 orthogonals · axial · 0.21mm/px · z∈[-227,-156]mm · 3 of 89 slices shown, 4 images]
[im 23/89  soft-tissue]
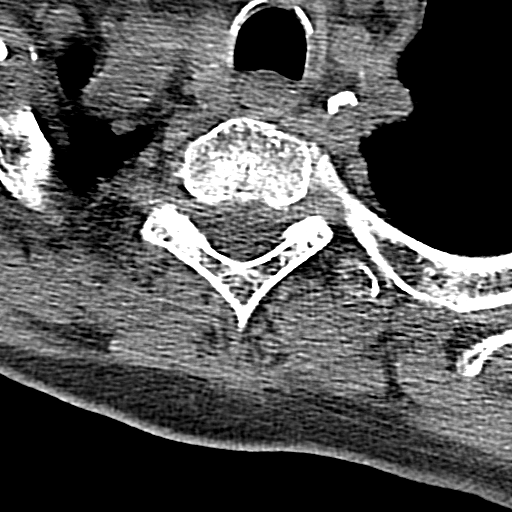
[im 23/89  bone]
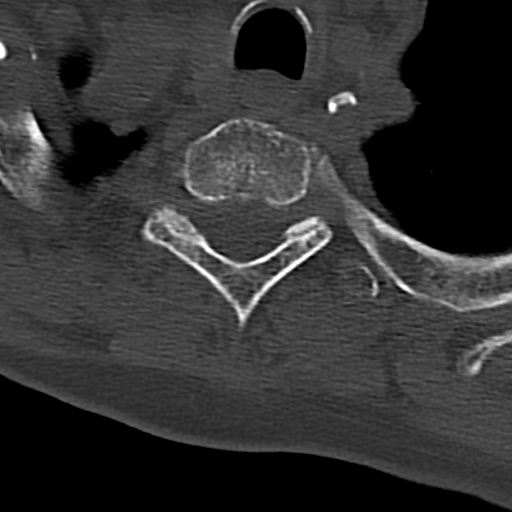
[im 45/89  bone]
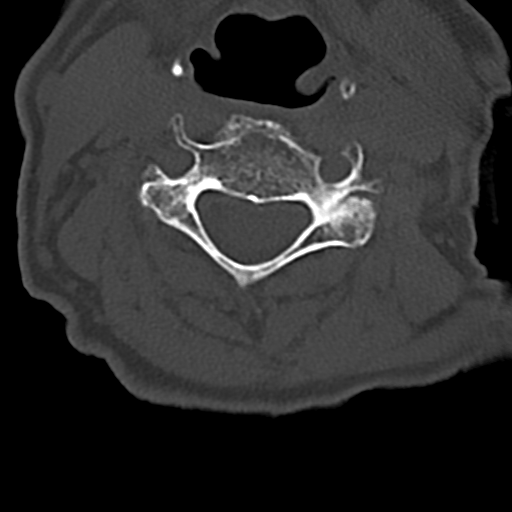
[im 67/89  bone]
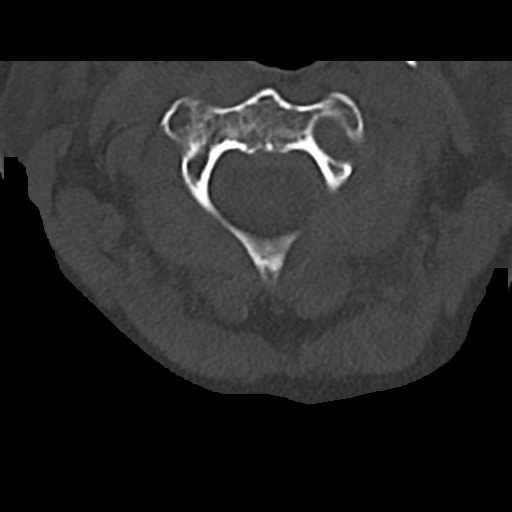

[15 of 33 positions shown; findings below may reference images not displayed]

FINDINGS: CT HEAD FINDINGS

Brain: There is atrophy and chronic small vessel disease changes. No
acute intracranial abnormality. Specifically, no hemorrhage,
hydrocephalus, mass lesion, acute infarction, or significant
intracranial injury.

Vascular: No hyperdense vessel or unexpected calcification.

Skull: No acute calvarial abnormality.

Sinuses/Orbits: Visualized paranasal sinuses and mastoids clear.
Orbital soft tissues unremarkable.

Other: None

CT CERVICAL SPINE FINDINGS

Alignment: No subluxation

Skull base and vertebrae: No acute fracture. No primary bone lesion
or focal pathologic process.

Soft tissues and spinal canal: No prevertebral fluid or swelling. No
visible canal hematoma.

Disc levels: Diffuse degenerative disc and facet disease throughout
the cervical spine.

Upper chest: Negative

Other: Atherosclerotic calcifications in the carotid, vertebral and
basilar arteries.
IMPRESSION: Atrophy, chronic microvascular disease.

No acute intracranial abnormality.

No acute bony abnormality in the cervical spine. Diffuse
degenerative disc and facet disease.

## 2019-05-05 IMAGING — CR LEFT KNEE - COMPLETE 4+ VIEW
4 series · 4 of 4 positions shown · non-contrast
Comparison: None.

CLINICAL DATA: Status post fall.  Pain.

EXAM:
LEFT KNEE - COMPLETE 4+ VIEW

[knee ap]
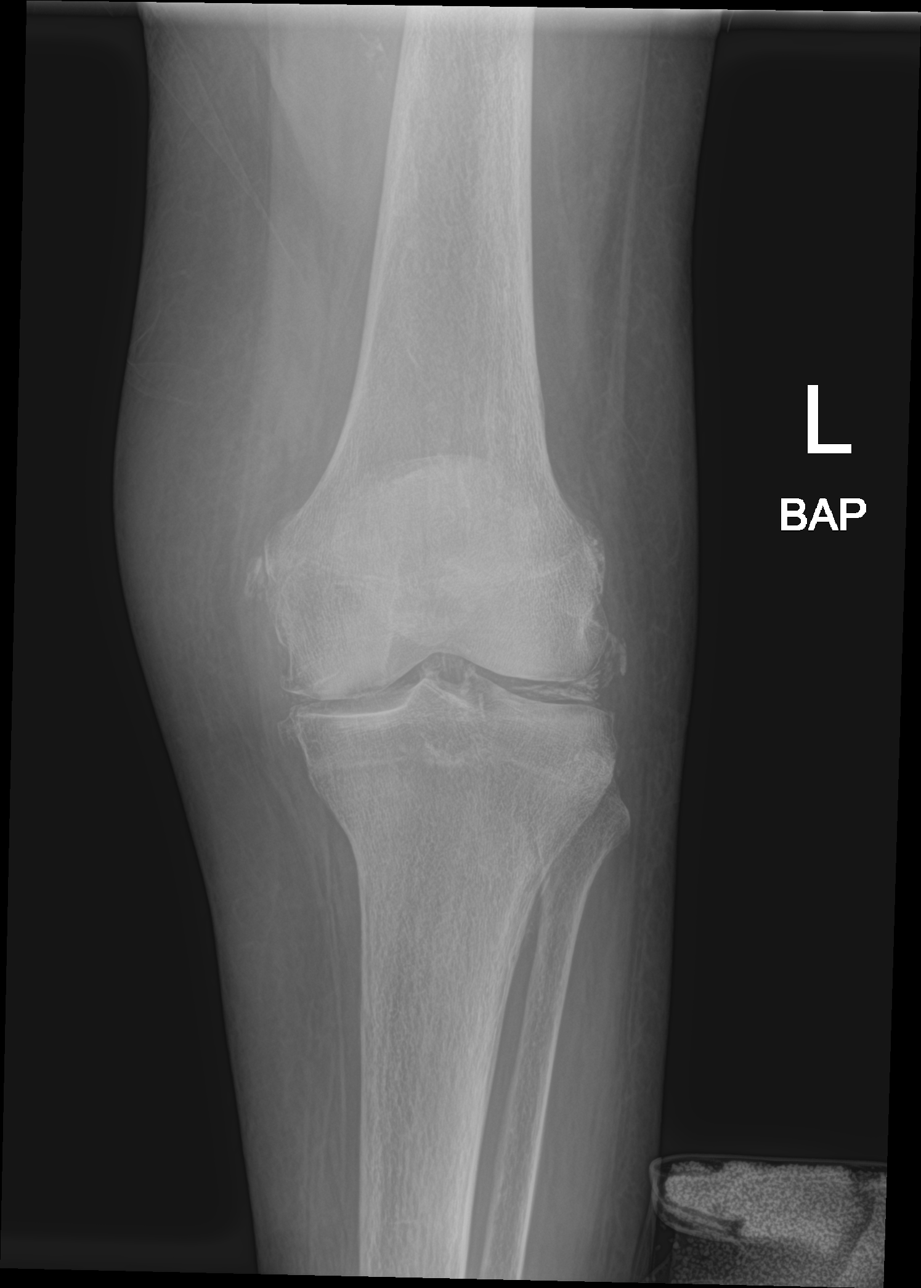

[knee lat]
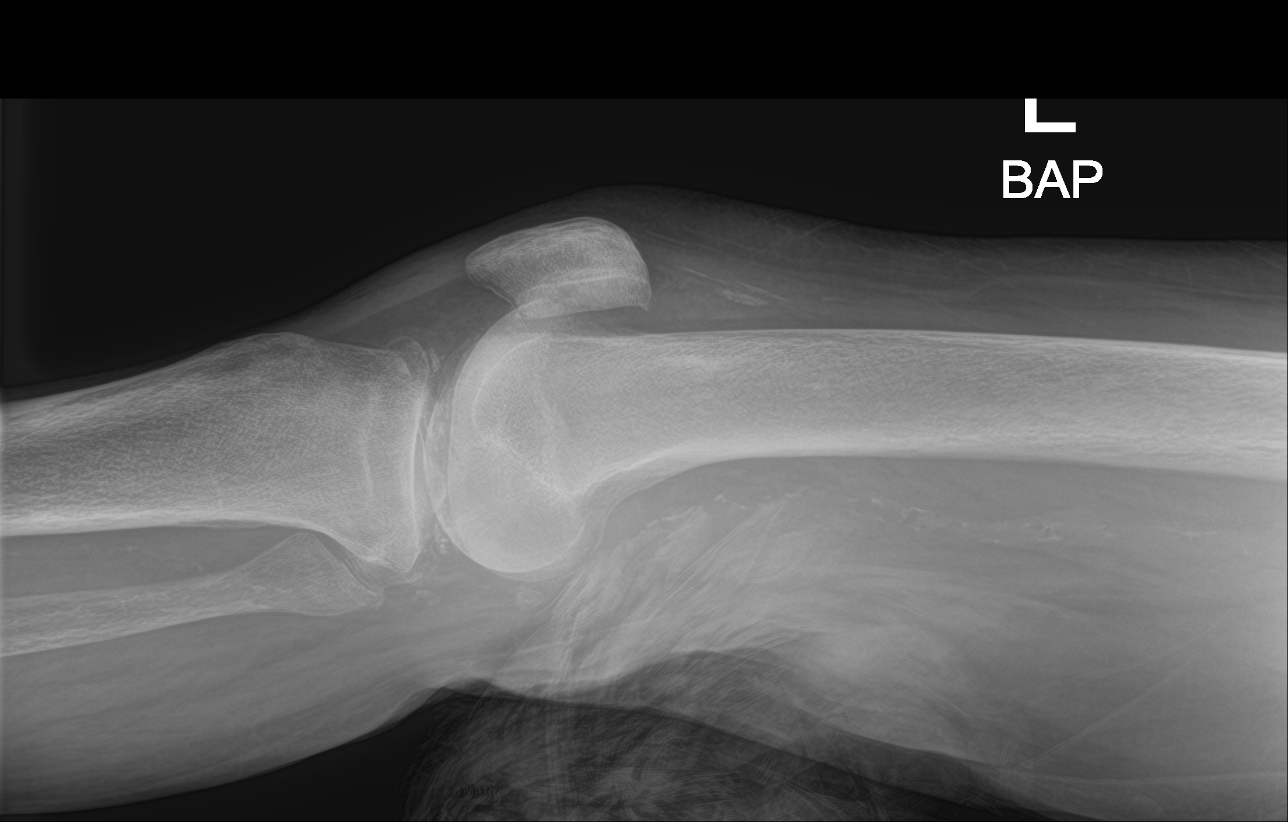

[knee obl (1 of 2)]
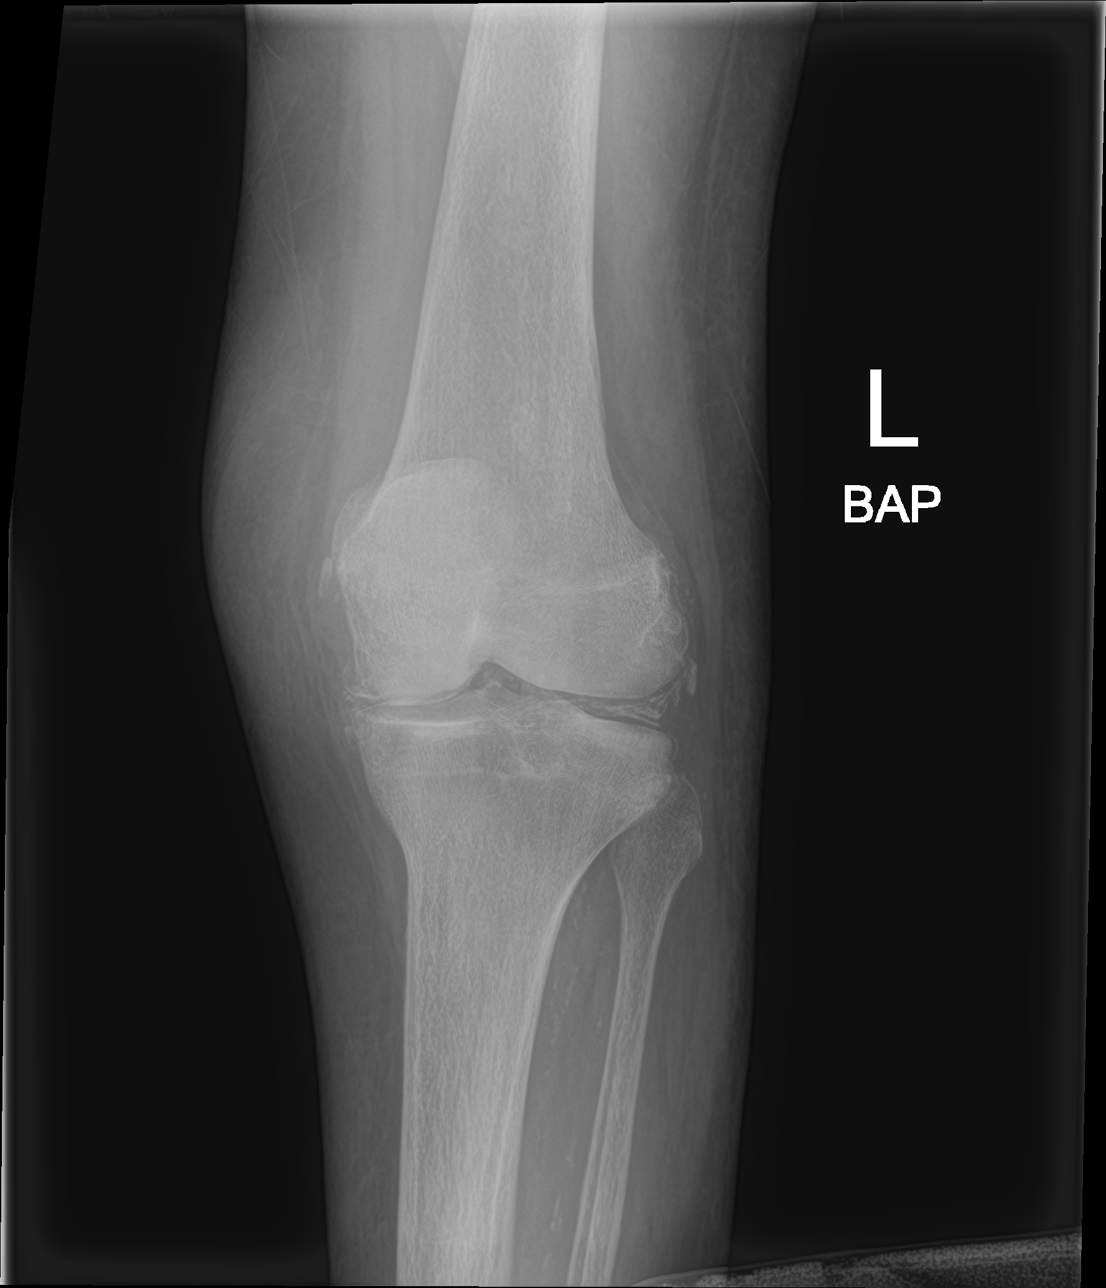

[knee obl (2 of 2)]
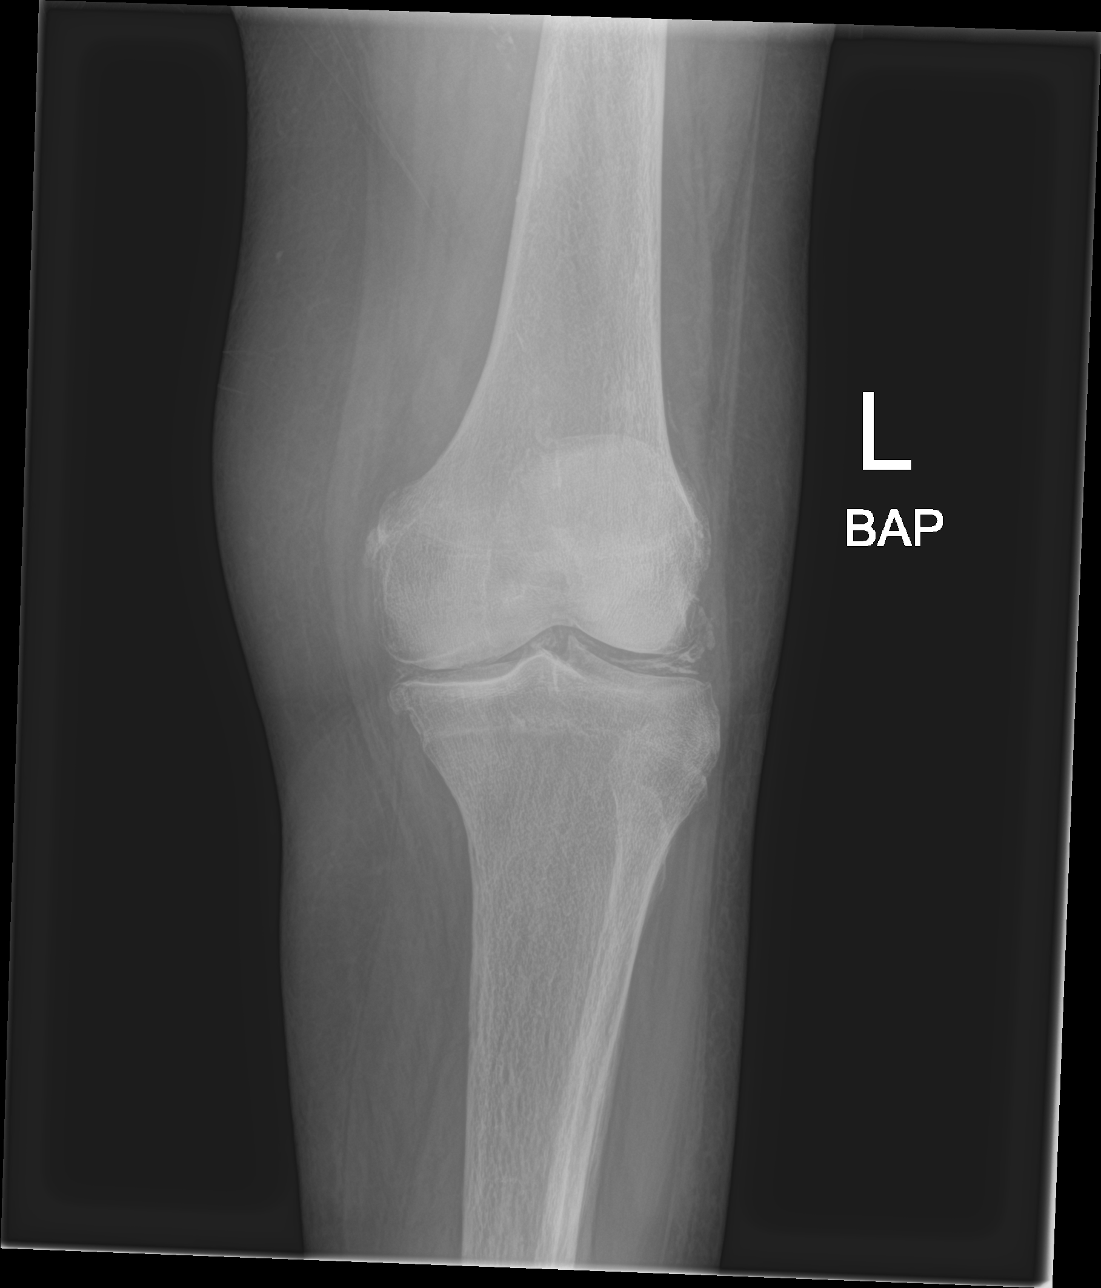

[4 of 4 positions shown; findings below may reference images not displayed]

FINDINGS: Generalized osteopenia. Nondisplaced fracture of the medial aspect
of the patella. No other fracture or dislocation. No aggressive
osseous lesion. Chondrocalcinosis of the medial and lateral
femorotibial compartments as can be seen with CPPD. Ossification
along the periphery of medial femoral condyle likely reflecting old
MCL injury. Mild medial femorotibial compartment joint space
narrowing. Peripheral vascular atherosclerotic disease.
IMPRESSION: Nondisplaced fracture of the medial aspect of the patella.

## 2019-05-05 IMAGING — CR LEFT TIBIA AND FIBULA - 2 VIEW
3 series · 3 of 3 positions shown · non-contrast
Comparison: Ankle series performed today

CLINICAL DATA: Fall, left leg pain.

EXAM:
LEFT TIBIA AND FIBULA - 2 VIEW

[tibia ap]
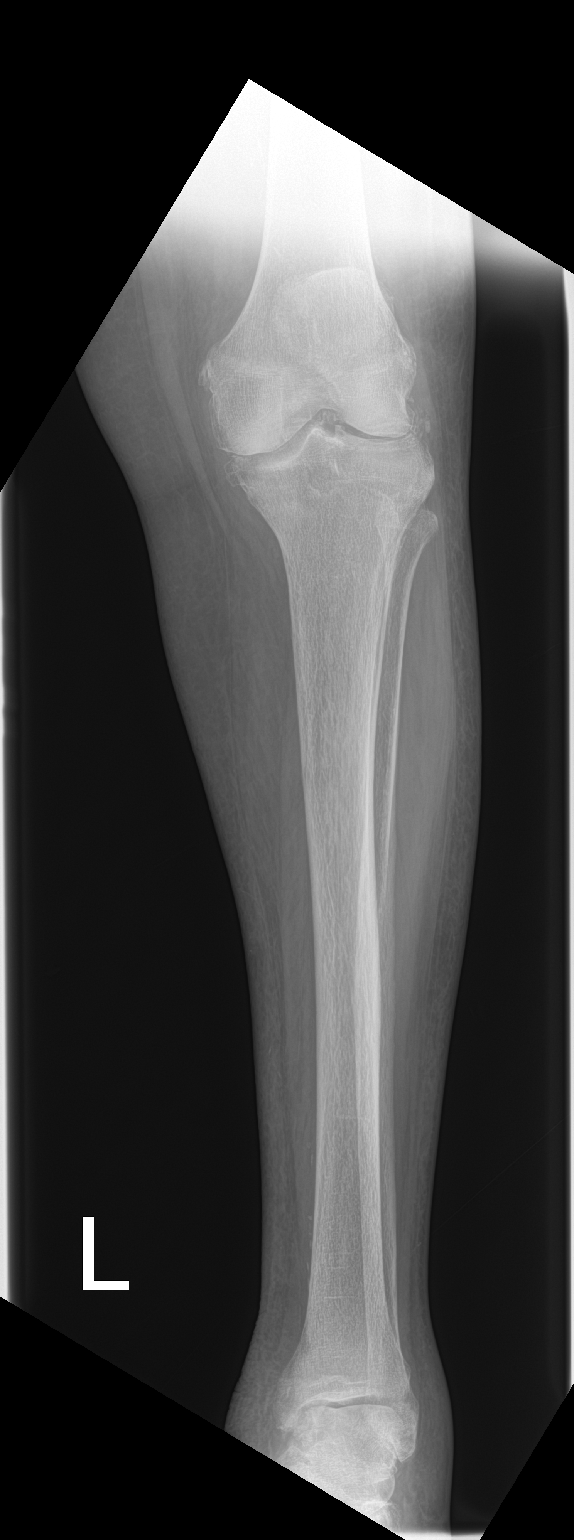

[tibia lat (1 of 2)]
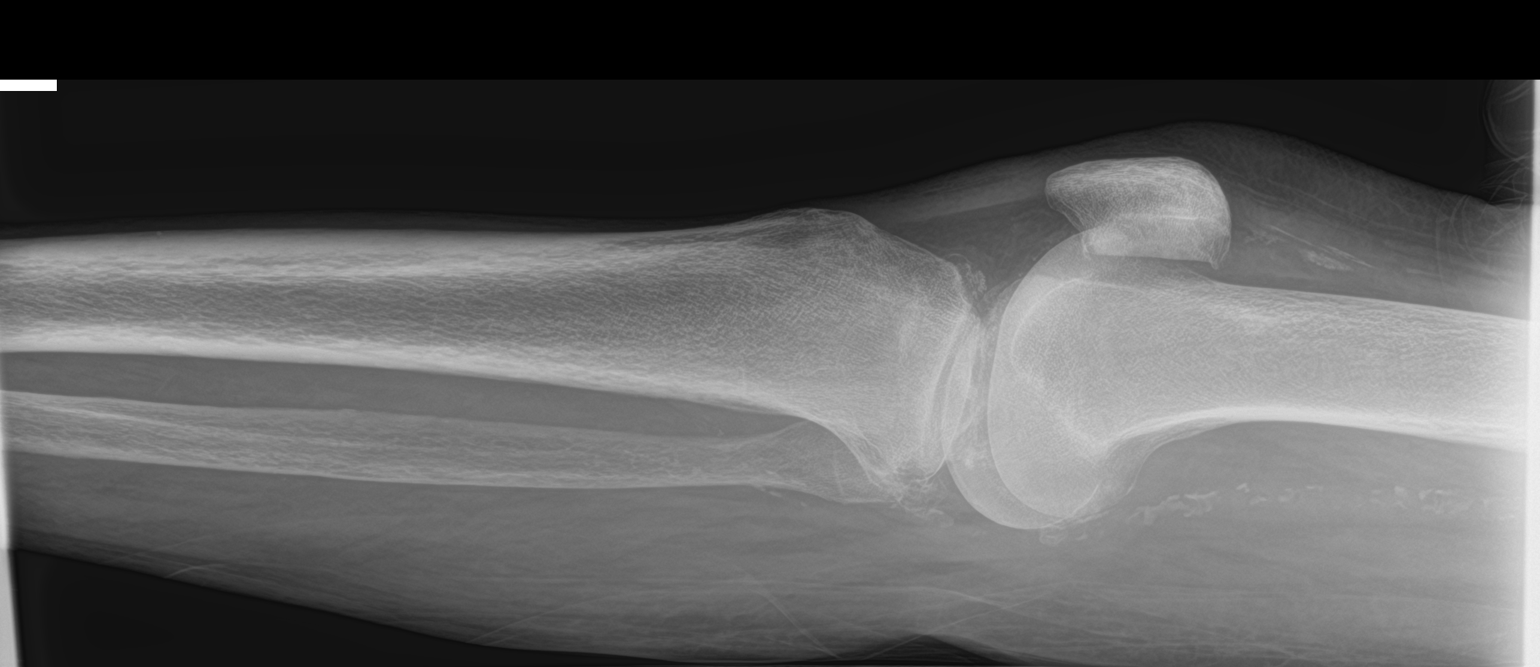

[tibia lat (2 of 2)]
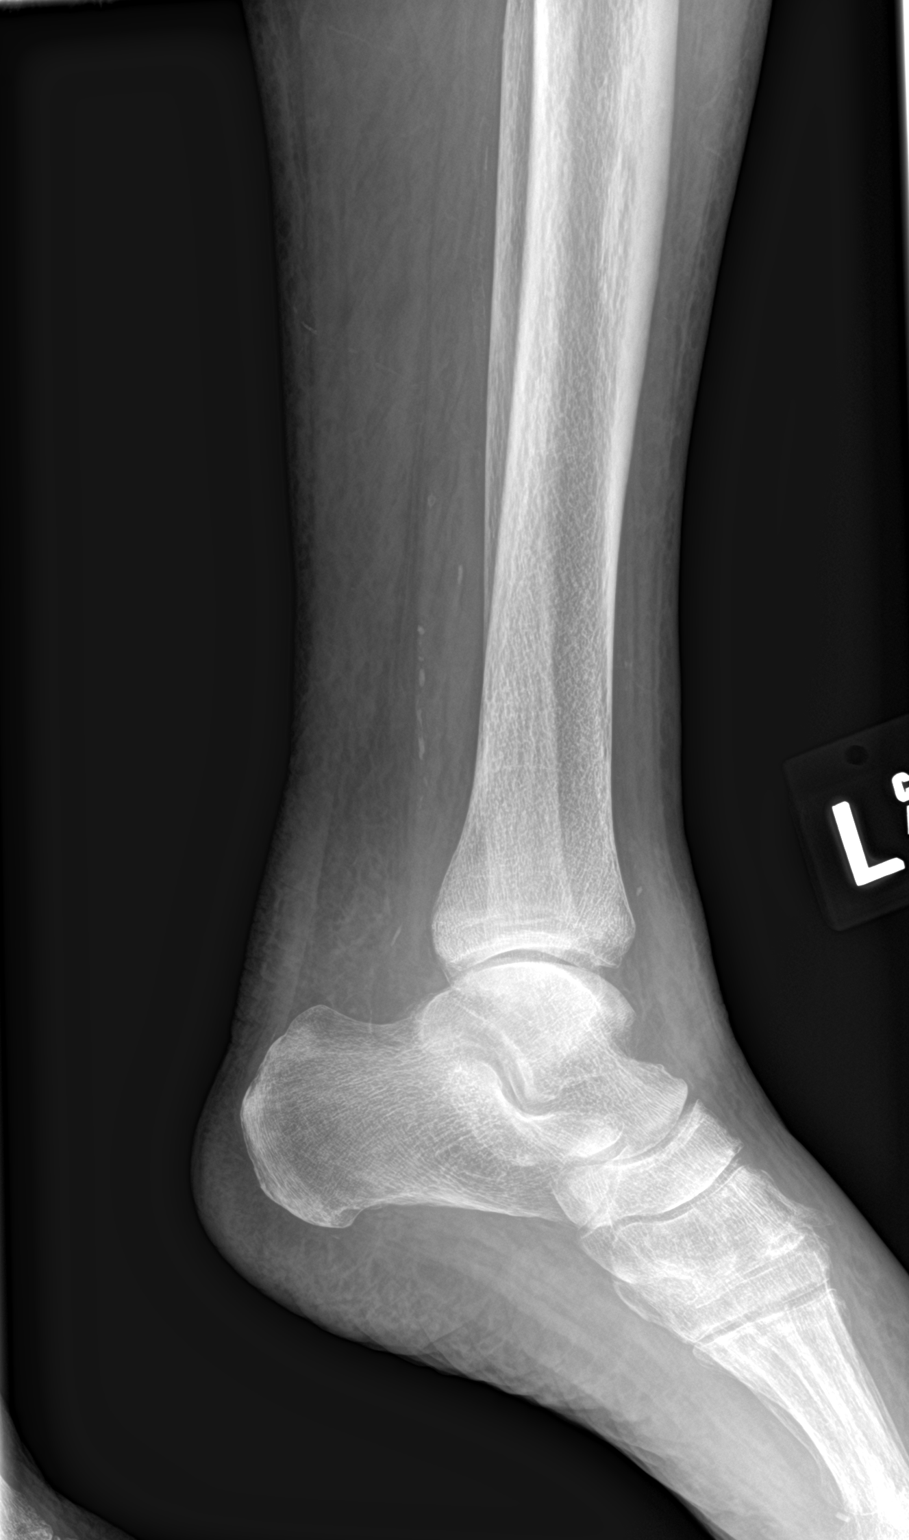

[3 of 3 positions shown; findings below may reference images not displayed]

FINDINGS: Moderate tricompartment degenerative changes in the left knee with
chondrocalcinosis. No joint effusion. No acute bony abnormality.
Specifically, no fracture, subluxation, or dislocation.
IMPRESSION: No acute bony abnormality.

## 2019-05-05 IMAGING — CR LEFT ANKLE COMPLETE - 3+ VIEW
3 series · 3 of 3 positions shown · non-contrast
Comparison: 03/14/2019

CLINICAL DATA: Fall, left knee and ankle pain.

EXAM:
LEFT ANKLE COMPLETE - 3+ VIEW

[ankle ap]
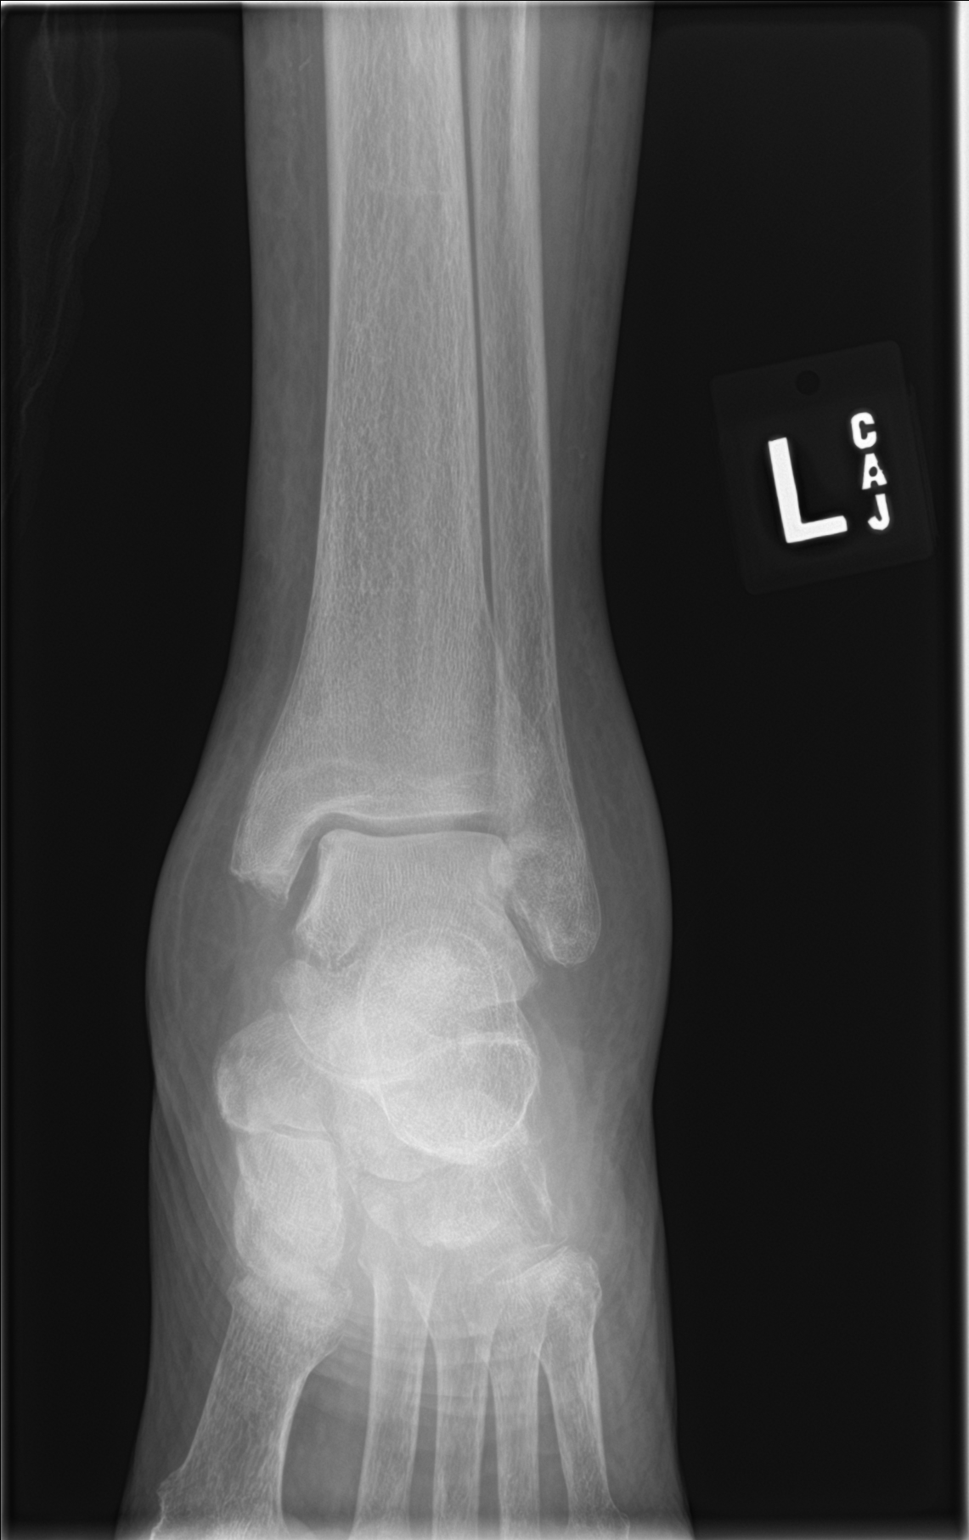

[ankle obl]
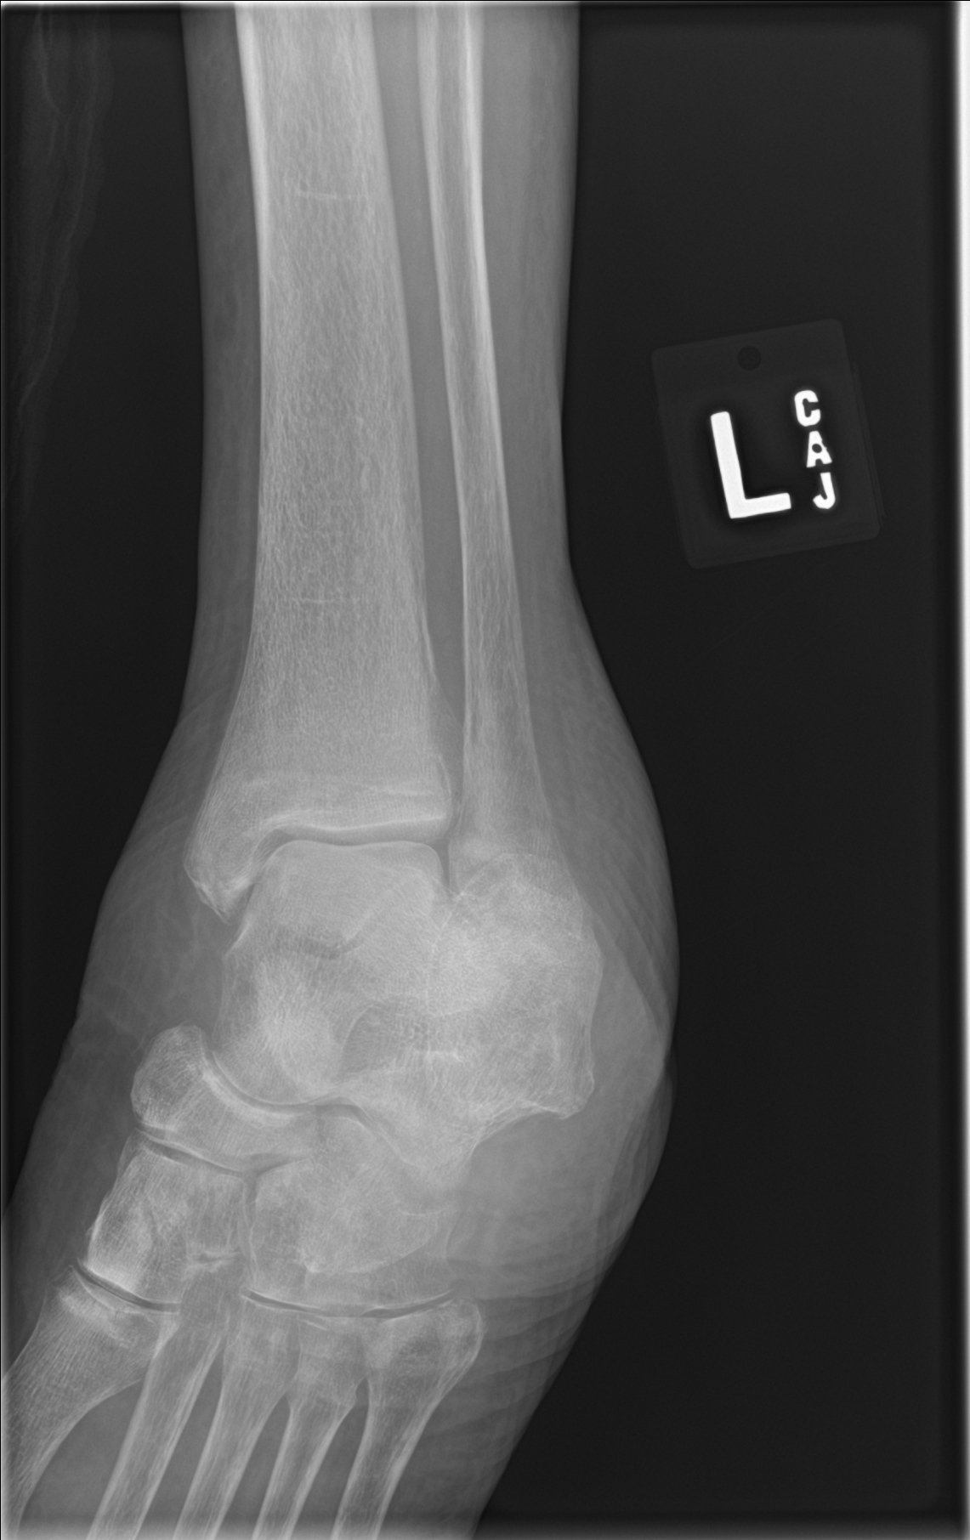

[ankle lat]
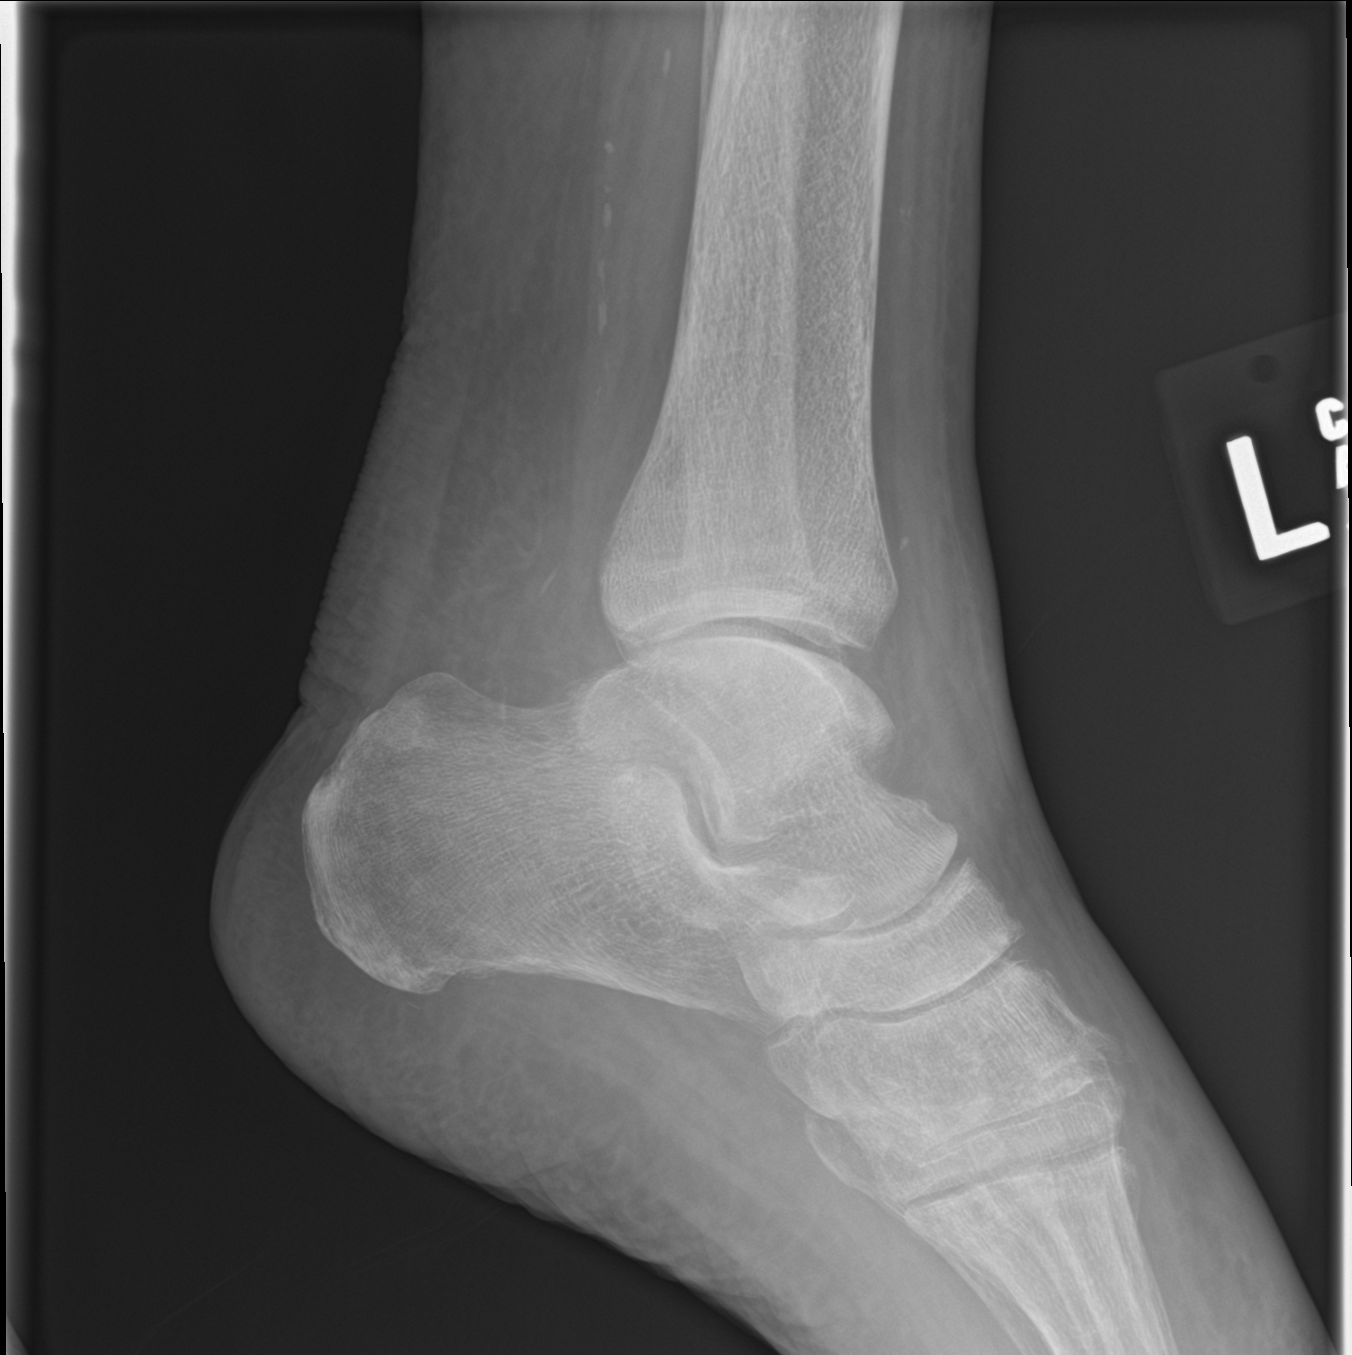

[3 of 3 positions shown; findings below may reference images not displayed]

FINDINGS: Diffuse soft tissue swelling. No acute bony abnormality.
Specifically, no fracture, subluxation, or dislocation. Vascular
calcifications noted.
IMPRESSION: No acute bony abnormality.

## 2019-05-06 DIAGNOSIS — F0390 Unspecified dementia without behavioral disturbance: Secondary | ICD-10-CM | POA: Diagnosis not present

## 2019-05-06 DIAGNOSIS — I110 Hypertensive heart disease with heart failure: Secondary | ICD-10-CM | POA: Diagnosis not present

## 2019-05-06 DIAGNOSIS — E1151 Type 2 diabetes mellitus with diabetic peripheral angiopathy without gangrene: Secondary | ICD-10-CM | POA: Diagnosis not present

## 2019-05-06 DIAGNOSIS — I2510 Atherosclerotic heart disease of native coronary artery without angina pectoris: Secondary | ICD-10-CM | POA: Diagnosis not present

## 2019-05-06 DIAGNOSIS — E1140 Type 2 diabetes mellitus with diabetic neuropathy, unspecified: Secondary | ICD-10-CM | POA: Diagnosis not present

## 2019-05-06 DIAGNOSIS — I5020 Unspecified systolic (congestive) heart failure: Secondary | ICD-10-CM | POA: Diagnosis not present

## 2019-05-10 DIAGNOSIS — I110 Hypertensive heart disease with heart failure: Secondary | ICD-10-CM | POA: Diagnosis not present

## 2019-05-10 DIAGNOSIS — I2510 Atherosclerotic heart disease of native coronary artery without angina pectoris: Secondary | ICD-10-CM | POA: Diagnosis not present

## 2019-05-10 DIAGNOSIS — I5020 Unspecified systolic (congestive) heart failure: Secondary | ICD-10-CM | POA: Diagnosis not present

## 2019-05-10 DIAGNOSIS — E1151 Type 2 diabetes mellitus with diabetic peripheral angiopathy without gangrene: Secondary | ICD-10-CM | POA: Diagnosis not present

## 2019-05-10 DIAGNOSIS — F0390 Unspecified dementia without behavioral disturbance: Secondary | ICD-10-CM | POA: Diagnosis not present

## 2019-05-10 DIAGNOSIS — E1140 Type 2 diabetes mellitus with diabetic neuropathy, unspecified: Secondary | ICD-10-CM | POA: Diagnosis not present

## 2019-05-13 DIAGNOSIS — I110 Hypertensive heart disease with heart failure: Secondary | ICD-10-CM | POA: Diagnosis not present

## 2019-05-13 DIAGNOSIS — I5020 Unspecified systolic (congestive) heart failure: Secondary | ICD-10-CM | POA: Diagnosis not present

## 2019-05-13 DIAGNOSIS — I2510 Atherosclerotic heart disease of native coronary artery without angina pectoris: Secondary | ICD-10-CM | POA: Diagnosis not present

## 2019-05-13 DIAGNOSIS — E1140 Type 2 diabetes mellitus with diabetic neuropathy, unspecified: Secondary | ICD-10-CM | POA: Diagnosis not present

## 2019-05-13 DIAGNOSIS — F0390 Unspecified dementia without behavioral disturbance: Secondary | ICD-10-CM | POA: Diagnosis not present

## 2019-05-13 DIAGNOSIS — E1151 Type 2 diabetes mellitus with diabetic peripheral angiopathy without gangrene: Secondary | ICD-10-CM | POA: Diagnosis not present

## 2019-05-15 ENCOUNTER — Telehealth: Payer: Self-pay | Admitting: *Deleted

## 2019-05-15 DIAGNOSIS — E1140 Type 2 diabetes mellitus with diabetic neuropathy, unspecified: Secondary | ICD-10-CM | POA: Diagnosis not present

## 2019-05-15 DIAGNOSIS — I2510 Atherosclerotic heart disease of native coronary artery without angina pectoris: Secondary | ICD-10-CM | POA: Diagnosis not present

## 2019-05-15 DIAGNOSIS — E1151 Type 2 diabetes mellitus with diabetic peripheral angiopathy without gangrene: Secondary | ICD-10-CM | POA: Diagnosis not present

## 2019-05-15 DIAGNOSIS — F0390 Unspecified dementia without behavioral disturbance: Secondary | ICD-10-CM | POA: Diagnosis not present

## 2019-05-15 DIAGNOSIS — I110 Hypertensive heart disease with heart failure: Secondary | ICD-10-CM | POA: Diagnosis not present

## 2019-05-15 DIAGNOSIS — I5020 Unspecified systolic (congestive) heart failure: Secondary | ICD-10-CM | POA: Diagnosis not present

## 2019-05-15 NOTE — Telephone Encounter (Signed)
Pts daughter calls because the trazodone is having a reverse effect on pt.  She stays up all night.  She has tried:  1. Splitting the pill and giving half and 2 hours later giving the other half. 2. She has also given 1.5 pills and that did not help.   She wonders if she can add melatonin (suggested by a friend) and when/how often she should give it.   Advised to not give 1.5 pills until she hears from MD.  To PCP. Christen Bame, CMA

## 2019-05-18 NOTE — Progress Notes (Signed)
Subjective: [name] presents today with painful, thick toenails 1-5 b/l that she cannot cut and which interfere with daily activities.  Pain is aggravated when wearing enclosed shoe gear.  She voices no new concerns on today's visit.   Current Outpatient Medications:  .  acetaminophen (TYLENOL) 500 MG tablet, Take 500 mg by mouth every 6 (six) hours as needed for moderate pain. , Disp: , Rfl:  .  albuterol (PROVENTIL HFA;VENTOLIN HFA) 108 (90 BASE) MCG/ACT inhaler, Inhale 2 puffs into the lungs every 6 (six) hours as needed. For shortness of breath, Disp: 1 Inhaler, Rfl: 6 .  Amino Acids-Protein Hydrolys (FEEDING SUPPLEMENT, PRO-STAT SUGAR FREE 64,) LIQD, Take 30 mLs by mouth 2 (two) times daily with a meal. ensure, Disp: 887 mL, Rfl: 0 .  amLODipine (NORVASC) 5 MG tablet, Take 1 tablet (5 mg total) by mouth daily., Disp: 90 tablet, Rfl: 3 .  aspirin EC 325 MG tablet, Take 1 tablet (325 mg total) by mouth daily., Disp: 90 tablet, Rfl: 3 .  carvedilol (COREG) 12.5 MG tablet, Take 1 tablet (12.5 mg total) by mouth 2 (two) times daily with a meal., Disp: 180 tablet, Rfl: 3 .  diclofenac sodium (VOLTAREN) 1 % GEL, Apply 2 g topically 4 (four) times daily., Disp: 1 Tube, Rfl: 0 .  donepezil (ARICEPT) 5 MG tablet, Take 1 tablet (5 mg total) by mouth at bedtime., Disp: 90 tablet, Rfl: 0 .  doxycycline (VIBRAMYCIN) 100 MG capsule, Take 1 capsule (100 mg total) by mouth 2 (two) times daily., Disp: 14 capsule, Rfl: 0 .  fluticasone (FLONASE) 50 MCG/ACT nasal spray, PLACE 1 SPRAY INTO BOTH NOSTRILS DAILY., Disp: 16 g, Rfl: 0 .  HYDROcodone-acetaminophen (NORCO/VICODIN) 5-325 MG tablet, Take 2 tablets by mouth every 6 (six) hours as needed for severe pain., Disp: 8 tablet, Rfl: 0 .  loratadine (CLARITIN) 10 MG tablet, TAKE 1/2 TABLET BY MOUTH DAILY (Patient taking differently: Take 5 mg by mouth daily as needed for allergies or rhinitis. ), Disp: 15 tablet, Rfl: 0 .  losartan (COZAAR) 100 MG tablet,  Take 100 mg by mouth daily., Disp: , Rfl:  .  losartan (COZAAR) 50 MG tablet, Take 1 tablet (50 mg total) by mouth daily. (Patient taking differently: Take 50-100 mg by mouth See admin instructions. Take 100 mg by mouth in the morning and 50 mg in the evening), Disp: 90 tablet, Rfl: 3 .  pantoprazole (PROTONIX) 40 MG tablet, TAKE 1 TABLET BY MOUTH EVERY DAY (Patient taking differently: Take 40 mg by mouth daily. ), Disp: 90 tablet, Rfl: 3 .  polyethylene glycol (MIRALAX / GLYCOLAX) packet, Take 17 g by mouth daily., Disp: 14 each, Rfl: 0 .  potassium chloride SA (K-DUR) 20 MEQ tablet, Take 1 tablet (20 mEq total) by mouth 2 (two) times daily for 7 days., Disp: 14 tablet, Rfl: 0 .  ranitidine (ZANTAC) 150 MG tablet, TAKE 1 TABLET BY MOUTH TWICE A DAY, Disp: 180 tablet, Rfl: 1 .  rosuvastatin (CRESTOR) 20 MG tablet, TAKE 1 TABLET BY MOUTH EVERYDAY AT BEDTIME (Patient taking differently: Take 20 mg by mouth at bedtime. ), Disp: 90 tablet, Rfl: 2 .  senna-docusate (SENOKOT-S) 8.6-50 MG tablet, Take 2 tablets by mouth at bedtime. (Patient taking differently: Take 1 tablet by mouth at bedtime as needed for mild constipation. ), Disp: 60 tablet, Rfl: 3 .  traZODone (DESYREL) 50 MG tablet, Take 0.5-1 tablets (25-50 mg total) by mouth at bedtime as needed for sleep., Disp: 90  tablet, Rfl: 0  Allergies  Allergen Reactions  . Tape Other (See Comments)    Skin is very thin and will tear and bruise easily!!   . Lisinopril Cough    Objective: Vitals:   04/29/19 1528  Temp: (!) 97.1 F (36.2 C)    Vascular Examination: Capillary refill time delayed x 10 digits.   Dorsalis pedis and Posterior tibial pulses faintly palpable b/l.  Digital hair absent x 10 digits.  Skin temperature gradient WNL b/l.  Dermatological Examination: Skin with pedal atrophy b/l.  Toenails 1-5 b/l discolored, thick, dystrophic with subungual debris and pain with palpation to nailbeds due to thickness of  nails.  Musculoskeletal: Muscle strength 5/5 to all LE muscle groups.  Neurological: Sensation intact with 10 gram monofilament.  Vibratory sensation absent b/l.  Assessment: Painful onychomycosis toenails 1-5 b/l  NIDDM with neuropathy and PAD  Plan: 1. Toenails 1-5 b/l were debrided in length and girth without iatrogenic bleeding. 2. Patient to continue soft, supportive shoe gear daily. 3. Patient to report any pedal injuries to medical professional immediately. 4. Follow up 3 months.  5. Patient/POA to call should there be a concern in the interim.

## 2019-05-19 NOTE — Telephone Encounter (Signed)
Spoke to patients daughter [name]. Informed her that it is okay to take up to 2 pills (100mg  Trazodone) to help patient sleep. Recommended taking all at one time instead of spacing them out. She can also try Melatonin if she would like. According to Uptodate, okay to try up to 200mg , however will try lower doses first. Fall precautions were discussed at length. She understood and agreed to plan.

## 2019-05-21 ENCOUNTER — Telehealth: Payer: Self-pay | Admitting: Family Medicine

## 2019-05-21 DIAGNOSIS — I5020 Unspecified systolic (congestive) heart failure: Secondary | ICD-10-CM | POA: Diagnosis not present

## 2019-05-21 DIAGNOSIS — I110 Hypertensive heart disease with heart failure: Secondary | ICD-10-CM | POA: Diagnosis not present

## 2019-05-21 DIAGNOSIS — E1151 Type 2 diabetes mellitus with diabetic peripheral angiopathy without gangrene: Secondary | ICD-10-CM | POA: Diagnosis not present

## 2019-05-21 DIAGNOSIS — E1140 Type 2 diabetes mellitus with diabetic neuropathy, unspecified: Secondary | ICD-10-CM | POA: Diagnosis not present

## 2019-05-21 DIAGNOSIS — I2510 Atherosclerotic heart disease of native coronary artery without angina pectoris: Secondary | ICD-10-CM | POA: Diagnosis not present

## 2019-05-21 DIAGNOSIS — F0390 Unspecified dementia without behavioral disturbance: Secondary | ICD-10-CM | POA: Diagnosis not present

## 2019-05-21 NOTE — Telephone Encounter (Signed)
Patients daughter called nurse line stating her mom still sleeping after taking trazadone 100mg . Patients daughter is wondering if she can add the melatonin with the trazadone, and if so how much? Please advise.

## 2019-05-21 NOTE — Telephone Encounter (Signed)
Spoke to daughter, [name]. She notes the 100mg  Trazodone is not helping. Inquired about other potential causes for her insomnia such as pain or depression but daughter denies these. Recommended discontinuing Trazodone at this time given no improvement. Patient currently has Melatonin 5mg  at home. She can take 1 to 2 tablets as needed for sleep. Discussed sleep hygiene and expectations in regards to difficulty with sleep at her age. Daughter understood and agreed to plan. RTC as needed for problems.

## 2019-05-23 DIAGNOSIS — E1140 Type 2 diabetes mellitus with diabetic neuropathy, unspecified: Secondary | ICD-10-CM | POA: Diagnosis not present

## 2019-05-23 DIAGNOSIS — I5020 Unspecified systolic (congestive) heart failure: Secondary | ICD-10-CM | POA: Diagnosis not present

## 2019-05-23 DIAGNOSIS — F0390 Unspecified dementia without behavioral disturbance: Secondary | ICD-10-CM | POA: Diagnosis not present

## 2019-05-23 DIAGNOSIS — E1151 Type 2 diabetes mellitus with diabetic peripheral angiopathy without gangrene: Secondary | ICD-10-CM | POA: Diagnosis not present

## 2019-05-23 DIAGNOSIS — I2510 Atherosclerotic heart disease of native coronary artery without angina pectoris: Secondary | ICD-10-CM | POA: Diagnosis not present

## 2019-05-23 DIAGNOSIS — I110 Hypertensive heart disease with heart failure: Secondary | ICD-10-CM | POA: Diagnosis not present

## 2019-05-26 DIAGNOSIS — F0390 Unspecified dementia without behavioral disturbance: Secondary | ICD-10-CM | POA: Diagnosis not present

## 2019-05-26 DIAGNOSIS — I110 Hypertensive heart disease with heart failure: Secondary | ICD-10-CM | POA: Diagnosis not present

## 2019-05-26 DIAGNOSIS — I5020 Unspecified systolic (congestive) heart failure: Secondary | ICD-10-CM | POA: Diagnosis not present

## 2019-05-26 DIAGNOSIS — E1140 Type 2 diabetes mellitus with diabetic neuropathy, unspecified: Secondary | ICD-10-CM | POA: Diagnosis not present

## 2019-05-26 DIAGNOSIS — E1151 Type 2 diabetes mellitus with diabetic peripheral angiopathy without gangrene: Secondary | ICD-10-CM | POA: Diagnosis not present

## 2019-05-26 DIAGNOSIS — I2510 Atherosclerotic heart disease of native coronary artery without angina pectoris: Secondary | ICD-10-CM | POA: Diagnosis not present

## 2019-05-30 DIAGNOSIS — E1140 Type 2 diabetes mellitus with diabetic neuropathy, unspecified: Secondary | ICD-10-CM | POA: Diagnosis not present

## 2019-05-30 DIAGNOSIS — E1151 Type 2 diabetes mellitus with diabetic peripheral angiopathy without gangrene: Secondary | ICD-10-CM | POA: Diagnosis not present

## 2019-05-30 DIAGNOSIS — I110 Hypertensive heart disease with heart failure: Secondary | ICD-10-CM | POA: Diagnosis not present

## 2019-05-30 DIAGNOSIS — I2510 Atherosclerotic heart disease of native coronary artery without angina pectoris: Secondary | ICD-10-CM | POA: Diagnosis not present

## 2019-05-30 DIAGNOSIS — I5020 Unspecified systolic (congestive) heart failure: Secondary | ICD-10-CM | POA: Diagnosis not present

## 2019-05-30 DIAGNOSIS — F0390 Unspecified dementia without behavioral disturbance: Secondary | ICD-10-CM | POA: Diagnosis not present

## 2019-06-04 DIAGNOSIS — M5030 Other cervical disc degeneration, unspecified cervical region: Secondary | ICD-10-CM | POA: Diagnosis not present

## 2019-06-04 DIAGNOSIS — Z86711 Personal history of pulmonary embolism: Secondary | ICD-10-CM | POA: Diagnosis not present

## 2019-06-04 DIAGNOSIS — K219 Gastro-esophageal reflux disease without esophagitis: Secondary | ICD-10-CM | POA: Diagnosis not present

## 2019-06-04 DIAGNOSIS — Z8701 Personal history of pneumonia (recurrent): Secondary | ICD-10-CM | POA: Diagnosis not present

## 2019-06-04 DIAGNOSIS — J45909 Unspecified asthma, uncomplicated: Secondary | ICD-10-CM | POA: Diagnosis not present

## 2019-06-04 DIAGNOSIS — H353130 Nonexudative age-related macular degeneration, bilateral, stage unspecified: Secondary | ICD-10-CM | POA: Diagnosis not present

## 2019-06-04 DIAGNOSIS — Z9181 History of falling: Secondary | ICD-10-CM | POA: Diagnosis not present

## 2019-06-04 DIAGNOSIS — E1140 Type 2 diabetes mellitus with diabetic neuropathy, unspecified: Secondary | ICD-10-CM | POA: Diagnosis not present

## 2019-06-04 DIAGNOSIS — D649 Anemia, unspecified: Secondary | ICD-10-CM | POA: Diagnosis not present

## 2019-06-04 DIAGNOSIS — F17290 Nicotine dependence, other tobacco product, uncomplicated: Secondary | ICD-10-CM | POA: Diagnosis not present

## 2019-06-04 DIAGNOSIS — M109 Gout, unspecified: Secondary | ICD-10-CM | POA: Diagnosis not present

## 2019-06-04 DIAGNOSIS — F329 Major depressive disorder, single episode, unspecified: Secondary | ICD-10-CM | POA: Diagnosis not present

## 2019-06-04 DIAGNOSIS — Z86718 Personal history of other venous thrombosis and embolism: Secondary | ICD-10-CM | POA: Diagnosis not present

## 2019-06-04 DIAGNOSIS — I2510 Atherosclerotic heart disease of native coronary artery without angina pectoris: Secondary | ICD-10-CM | POA: Diagnosis not present

## 2019-06-04 DIAGNOSIS — F0390 Unspecified dementia without behavioral disturbance: Secondary | ICD-10-CM | POA: Diagnosis not present

## 2019-06-04 DIAGNOSIS — Z853 Personal history of malignant neoplasm of breast: Secondary | ICD-10-CM | POA: Diagnosis not present

## 2019-06-04 DIAGNOSIS — D638 Anemia in other chronic diseases classified elsewhere: Secondary | ICD-10-CM | POA: Diagnosis not present

## 2019-06-04 DIAGNOSIS — E1151 Type 2 diabetes mellitus with diabetic peripheral angiopathy without gangrene: Secondary | ICD-10-CM | POA: Diagnosis not present

## 2019-06-04 DIAGNOSIS — K5790 Diverticulosis of intestine, part unspecified, without perforation or abscess without bleeding: Secondary | ICD-10-CM | POA: Diagnosis not present

## 2019-06-04 DIAGNOSIS — Z681 Body mass index (BMI) 19 or less, adult: Secondary | ICD-10-CM | POA: Diagnosis not present

## 2019-06-04 DIAGNOSIS — I5020 Unspecified systolic (congestive) heart failure: Secondary | ICD-10-CM | POA: Diagnosis not present

## 2019-06-04 DIAGNOSIS — I110 Hypertensive heart disease with heart failure: Secondary | ICD-10-CM | POA: Diagnosis not present

## 2019-06-04 DIAGNOSIS — Z9013 Acquired absence of bilateral breasts and nipples: Secondary | ICD-10-CM | POA: Diagnosis not present

## 2019-06-04 DIAGNOSIS — Z85038 Personal history of other malignant neoplasm of large intestine: Secondary | ICD-10-CM | POA: Diagnosis not present

## 2019-06-04 DIAGNOSIS — E785 Hyperlipidemia, unspecified: Secondary | ICD-10-CM | POA: Diagnosis not present

## 2019-06-04 IMAGING — CR LUMBAR SPINE - COMPLETE 4+ VIEW
5 series · 5 of 5 positions shown · non-contrast
Comparison: CT scan January 25, 2014

CLINICAL DATA: Pain after fall.

EXAM:
LUMBAR SPINE - COMPLETE 4+ VIEW

[l-spine ap]
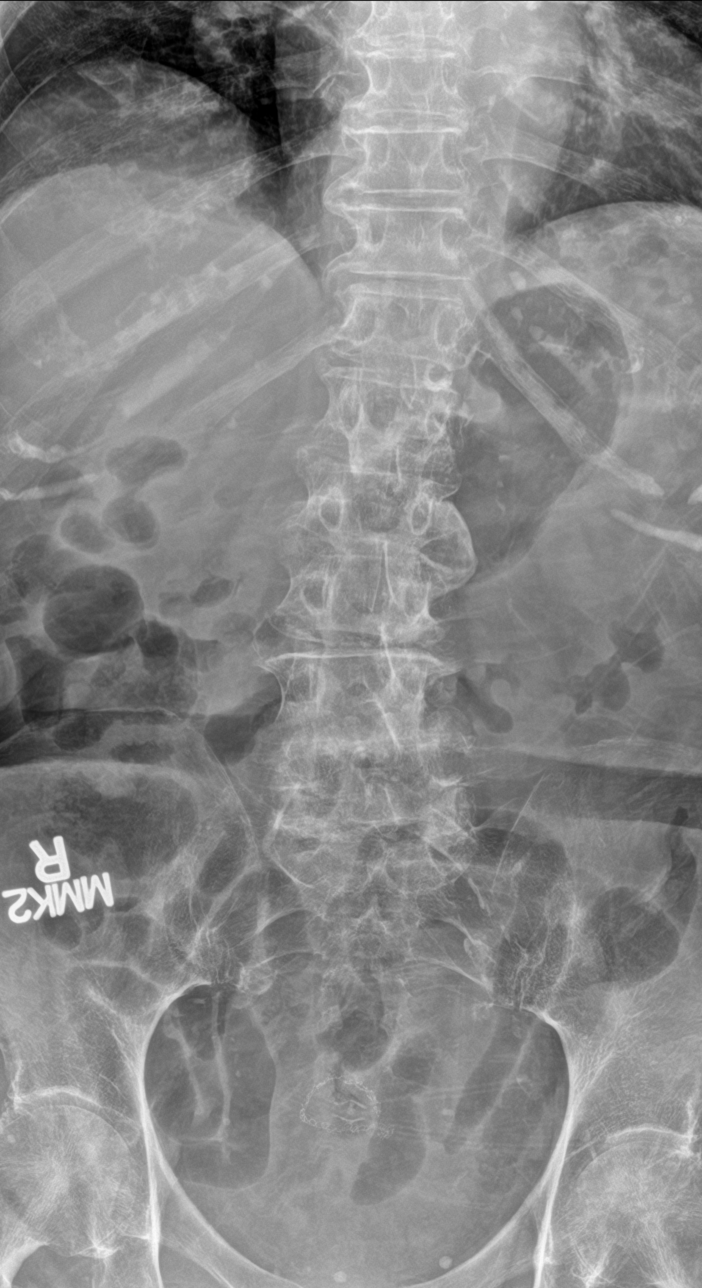

[l-spine obl (1 of 2)]
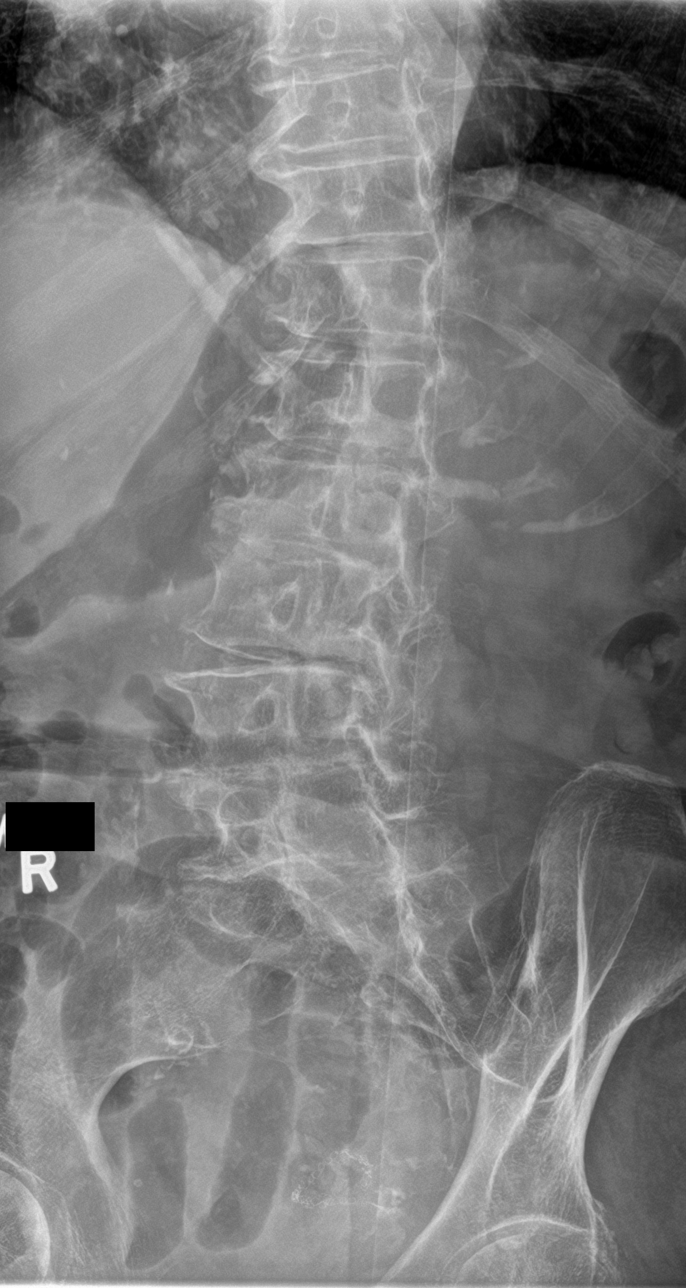

[l-spine obl (2 of 2)]
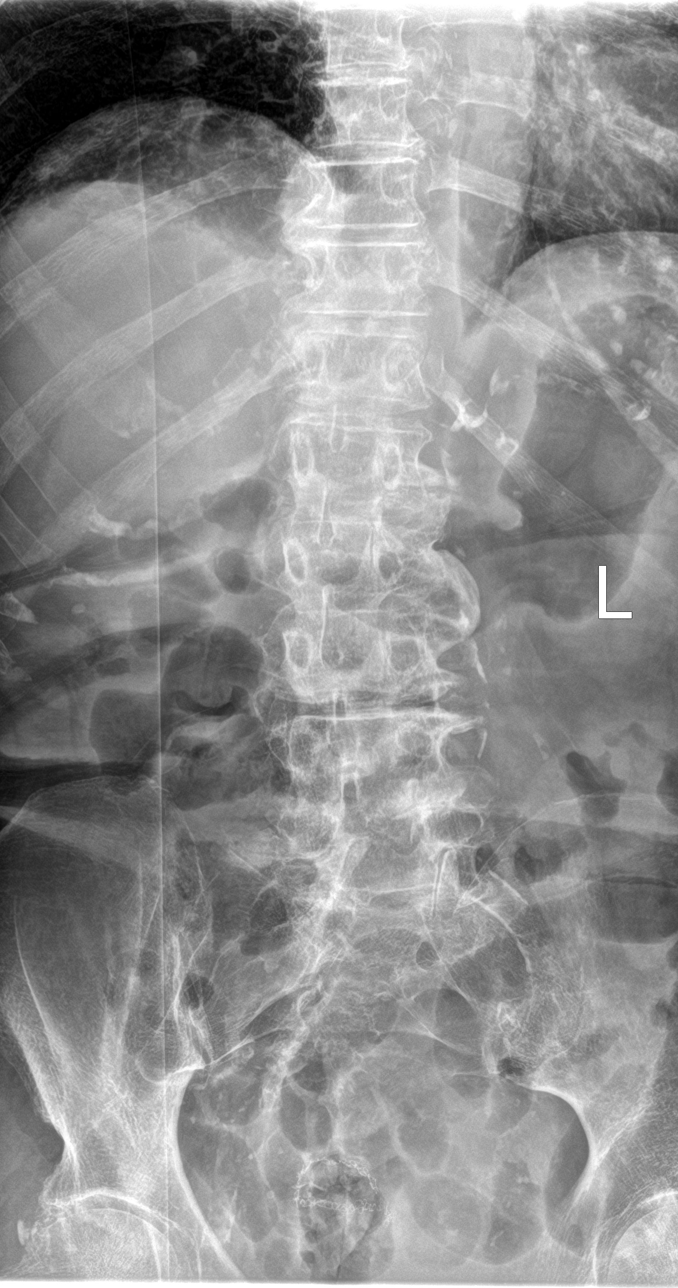

[l-spine lat]
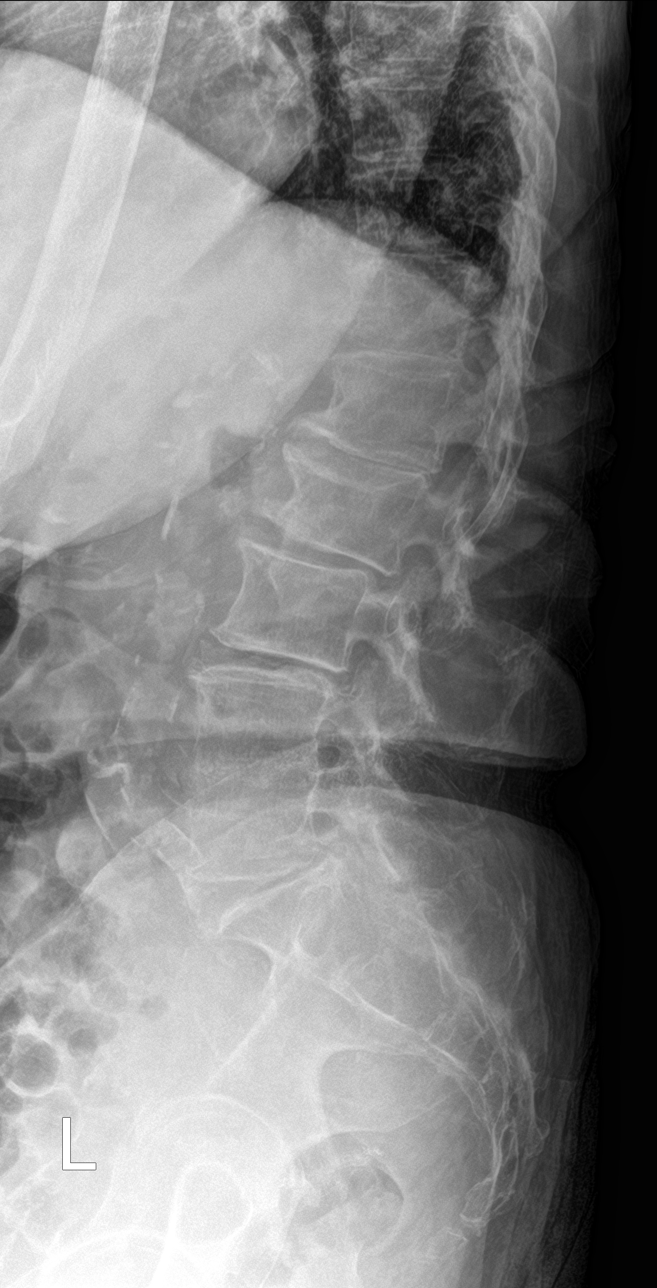

[l-spine spot]
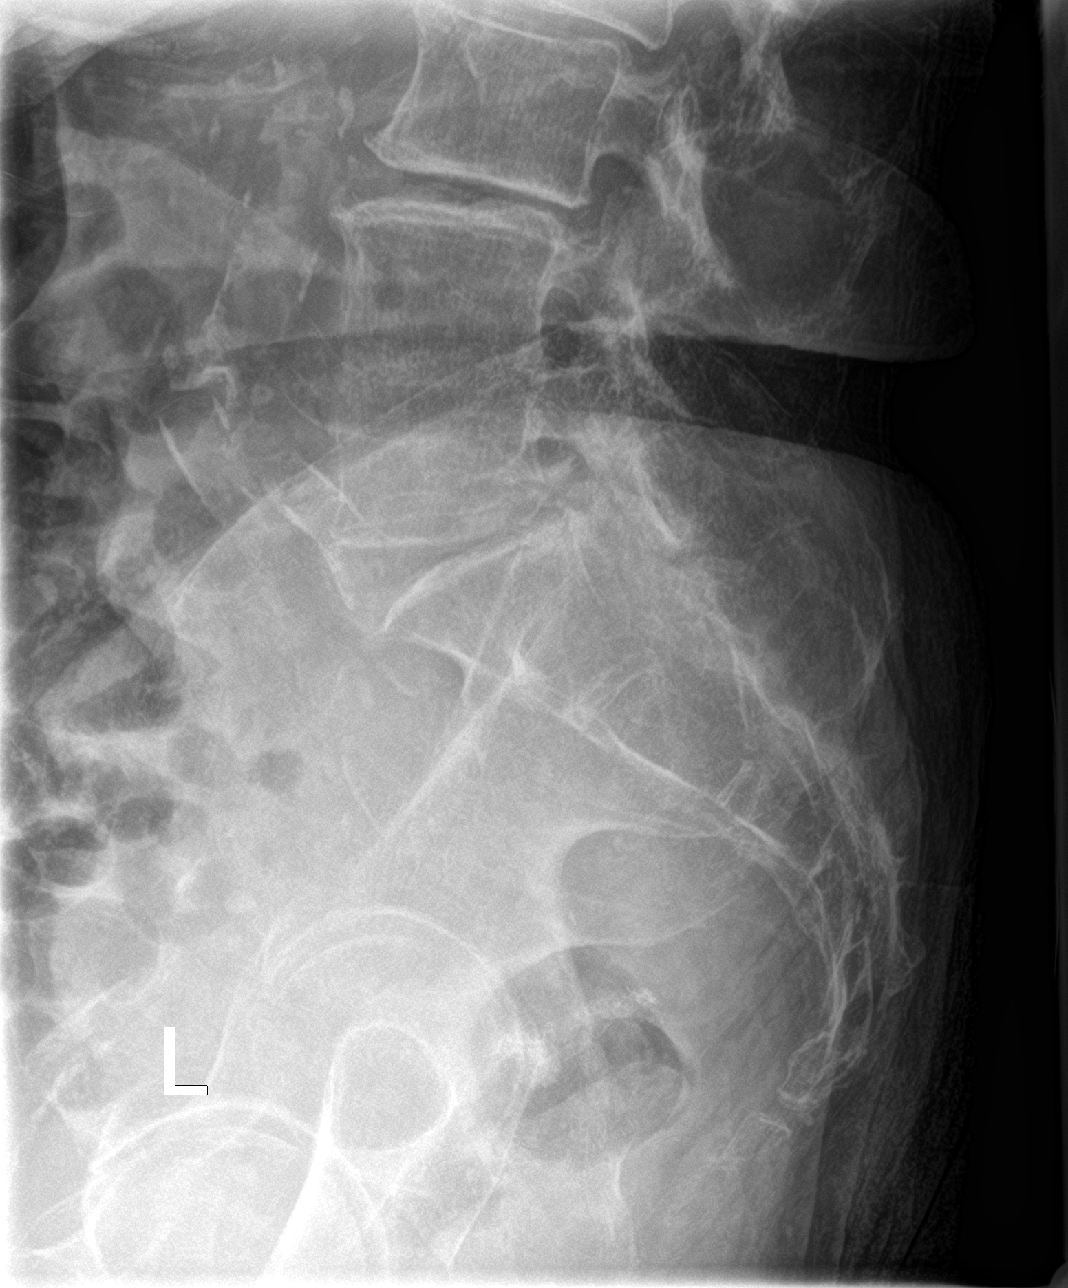

[5 of 5 positions shown; findings below may reference images not displayed]

FINDINGS: Anterior wedging of L2 is stable since Saturday January, 2014. No other
fractures. Retrolisthesis of L2 versus L3, also stable since the
comparison study. Degenerative changes. Atherosclerotic changes in
the abdominal aorta.
IMPRESSION: 1. No interval change in the anterior wedging of L2, the
retrolisthesis of L2 versus L3, or the degenerative changes.

## 2019-06-04 IMAGING — CR THORACIC SPINE 2 VIEWS
3 series · 3 of 3 positions shown · non-contrast
Comparison: November 27, 2018

CLINICAL DATA: Recent falls.  Leg numbness.

EXAM:
THORACIC SPINE 2 VIEWS

[t-spine ap]
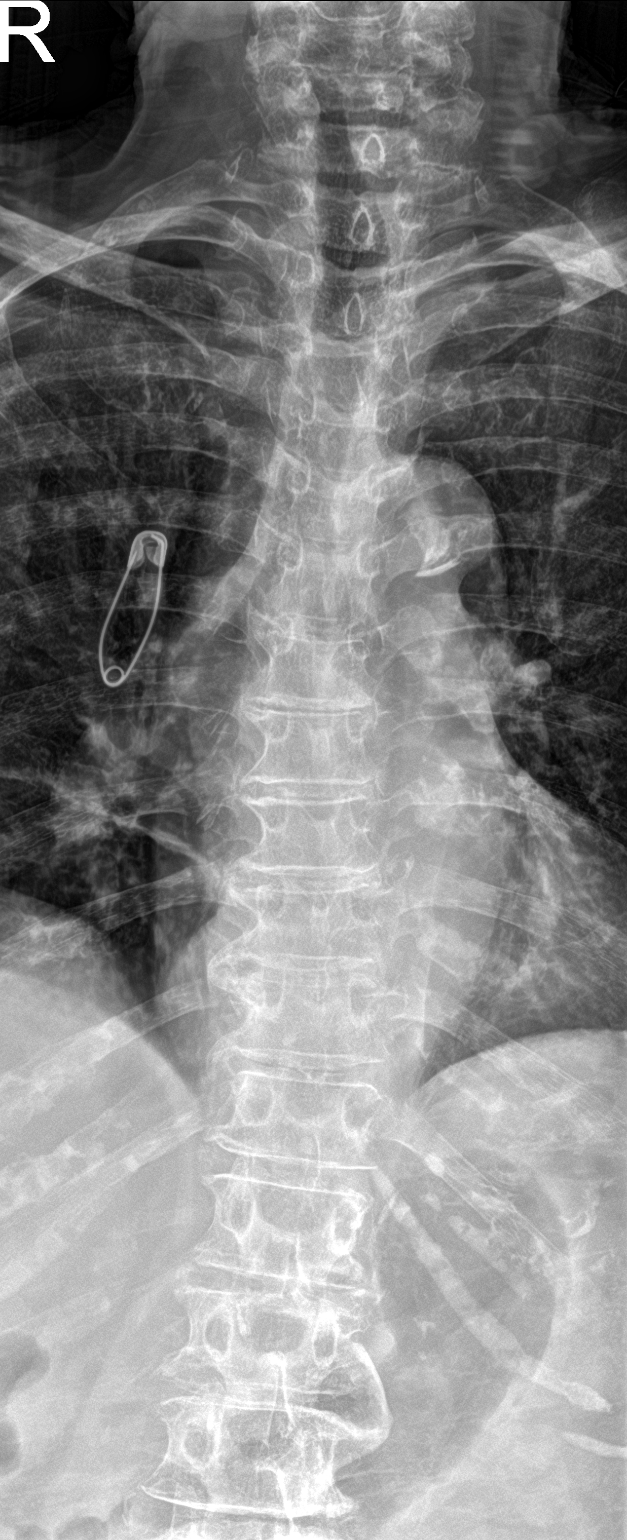

[t-spine lat]
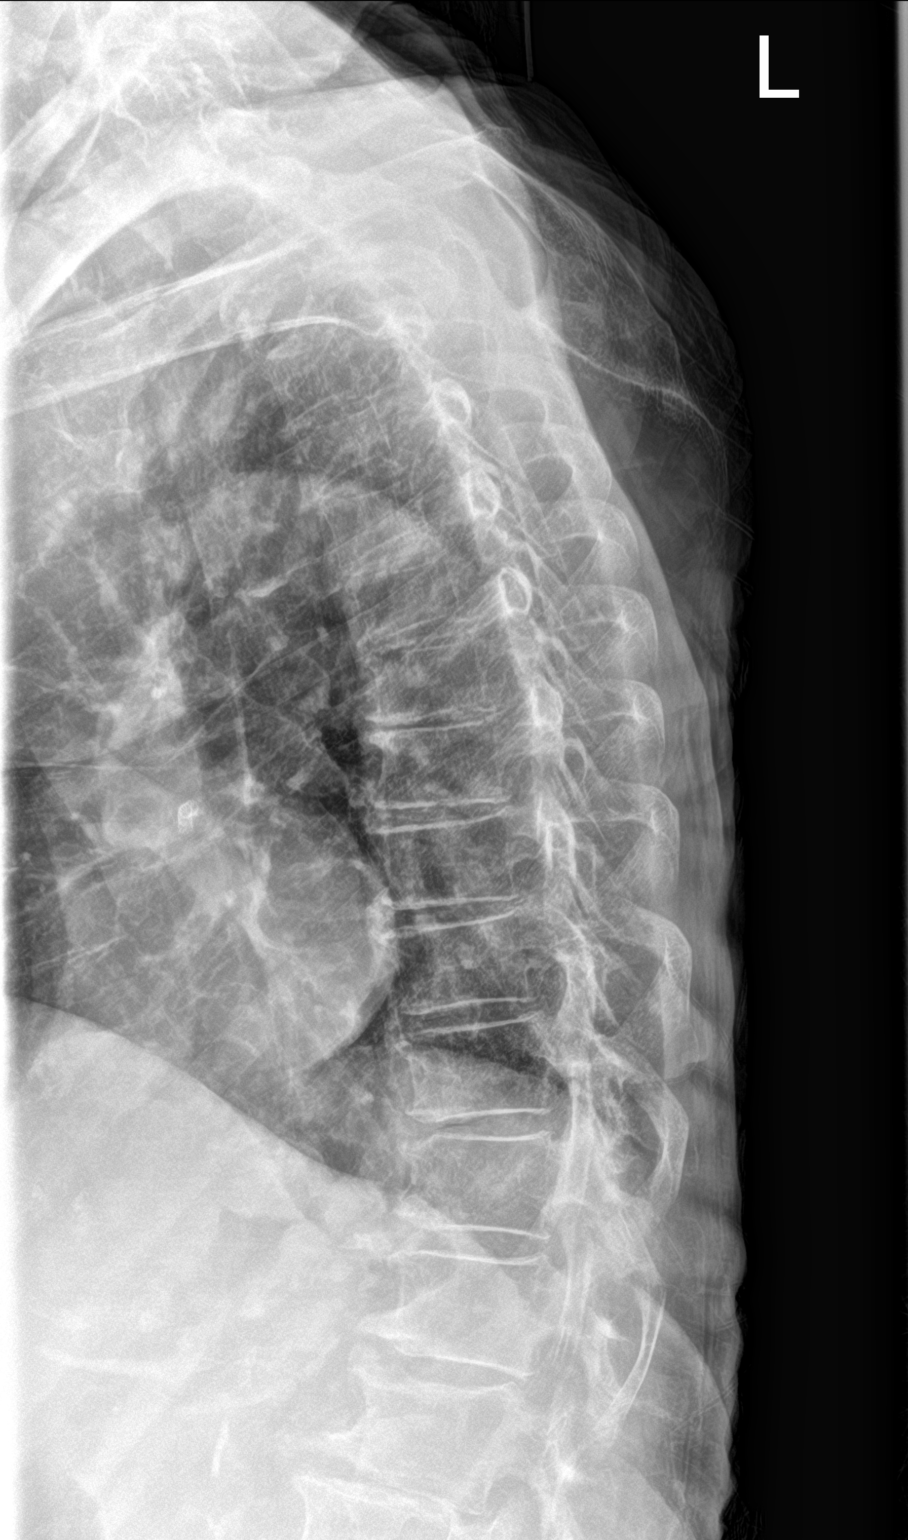

[t-spine swimmers]
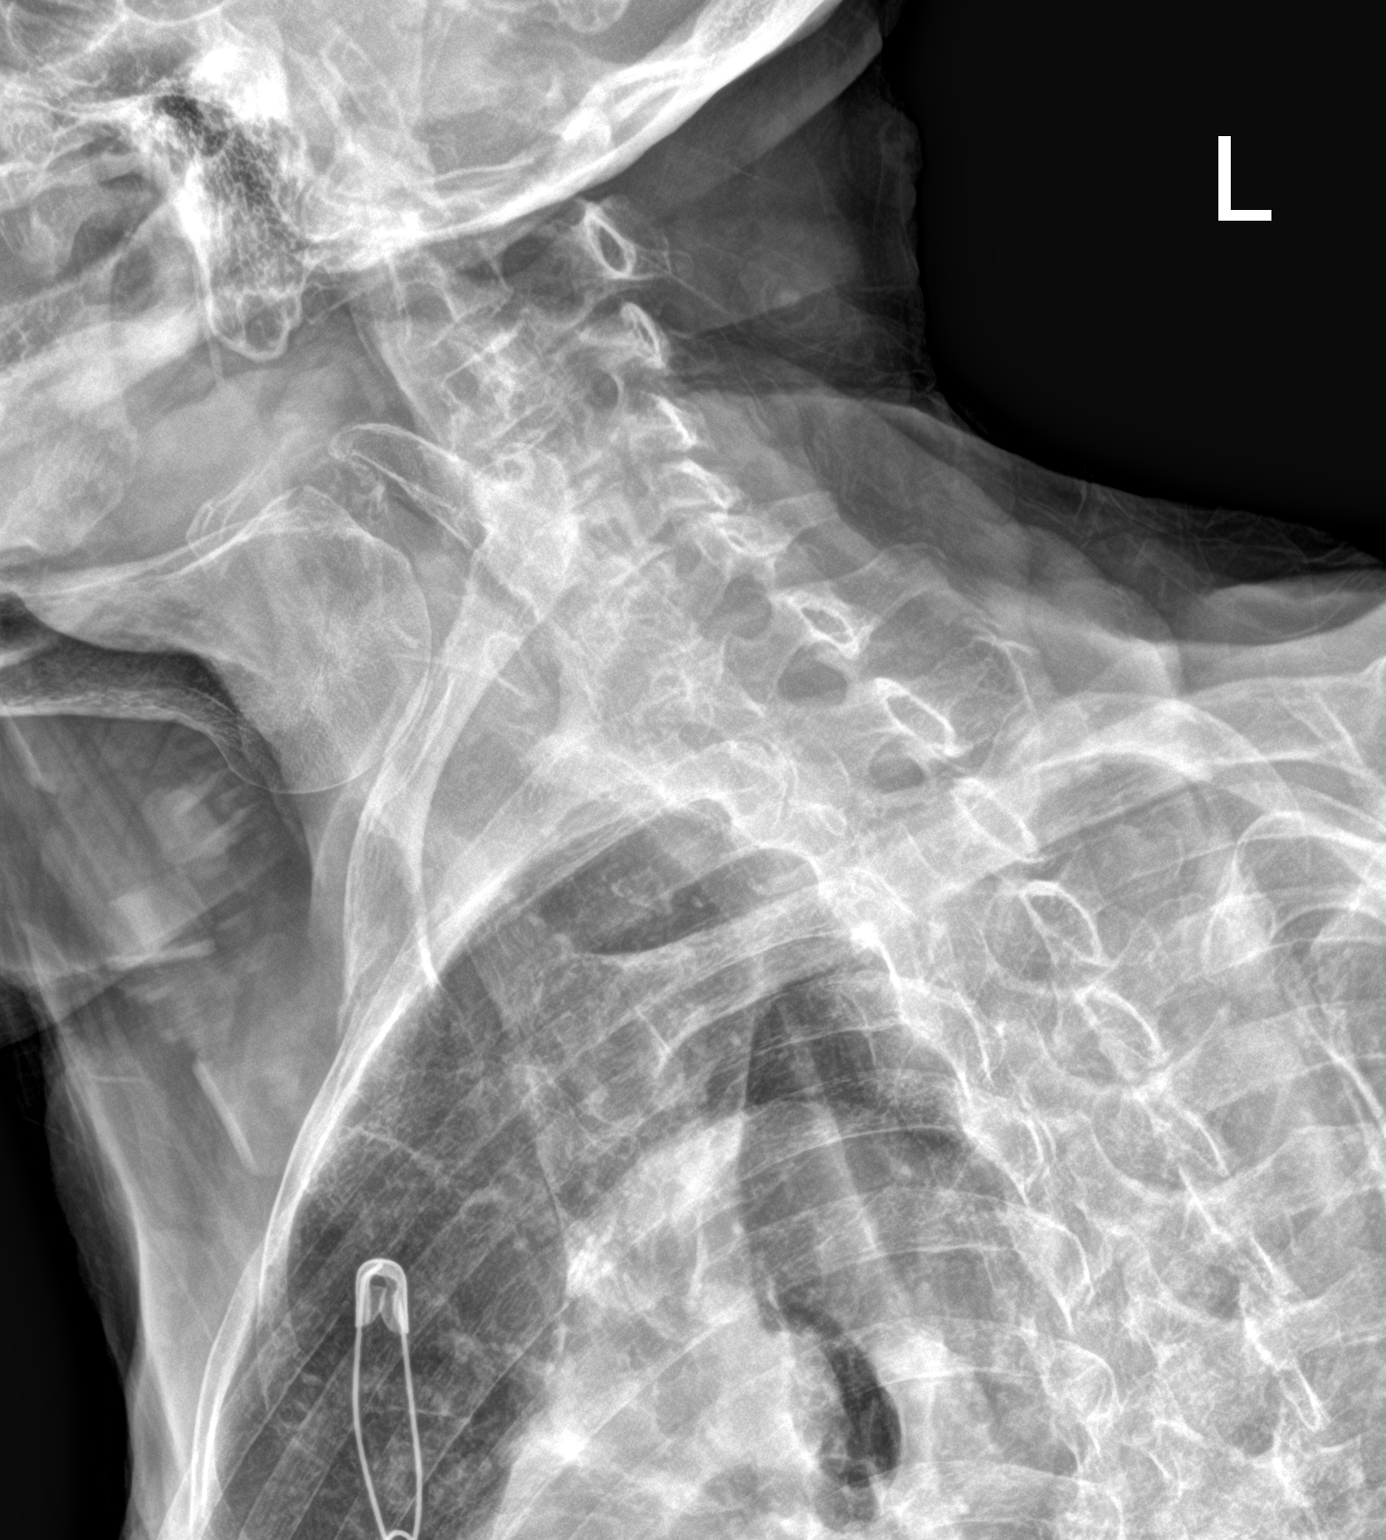

[3 of 3 positions shown; findings below may reference images not displayed]

FINDINGS: Suggested mild anterior wedging of an upper lumbar vertebral body.
Suggested retrolisthesis at this level as well. No fractures or
malalignment in the thoracic spine. No other acute abnormalities.
IMPRESSION: 1. No thoracic spine fracture or traumatic malalignment.
2. Suggested mild anterior wedging of an upper lumbar vertebral
body, incompletely evaluated. Please see the lumbar spine x-ray from
today for further evaluation. There may be mild malalignment at this
level as well.

## 2019-06-06 DIAGNOSIS — I5020 Unspecified systolic (congestive) heart failure: Secondary | ICD-10-CM | POA: Diagnosis not present

## 2019-06-06 DIAGNOSIS — E1140 Type 2 diabetes mellitus with diabetic neuropathy, unspecified: Secondary | ICD-10-CM | POA: Diagnosis not present

## 2019-06-06 DIAGNOSIS — F0390 Unspecified dementia without behavioral disturbance: Secondary | ICD-10-CM | POA: Diagnosis not present

## 2019-06-06 DIAGNOSIS — I2510 Atherosclerotic heart disease of native coronary artery without angina pectoris: Secondary | ICD-10-CM | POA: Diagnosis not present

## 2019-06-06 DIAGNOSIS — E1151 Type 2 diabetes mellitus with diabetic peripheral angiopathy without gangrene: Secondary | ICD-10-CM | POA: Diagnosis not present

## 2019-06-06 DIAGNOSIS — I110 Hypertensive heart disease with heart failure: Secondary | ICD-10-CM | POA: Diagnosis not present

## 2019-06-11 DIAGNOSIS — E1151 Type 2 diabetes mellitus with diabetic peripheral angiopathy without gangrene: Secondary | ICD-10-CM | POA: Diagnosis not present

## 2019-06-11 DIAGNOSIS — I110 Hypertensive heart disease with heart failure: Secondary | ICD-10-CM | POA: Diagnosis not present

## 2019-06-11 DIAGNOSIS — E1140 Type 2 diabetes mellitus with diabetic neuropathy, unspecified: Secondary | ICD-10-CM | POA: Diagnosis not present

## 2019-06-11 DIAGNOSIS — I2510 Atherosclerotic heart disease of native coronary artery without angina pectoris: Secondary | ICD-10-CM | POA: Diagnosis not present

## 2019-06-11 DIAGNOSIS — F0390 Unspecified dementia without behavioral disturbance: Secondary | ICD-10-CM | POA: Diagnosis not present

## 2019-06-11 DIAGNOSIS — I5020 Unspecified systolic (congestive) heart failure: Secondary | ICD-10-CM | POA: Diagnosis not present

## 2019-06-12 IMAGING — DX SACRUM AND COCCYX - 2+ VIEW
3 series · 3 of 3 positions shown · non-contrast
Comparison: 03/14/2019

CLINICAL DATA: Low back pain after fall.

EXAM:
SACRUM AND COCCYX - 2+ VIEW

[t sacrum ap]
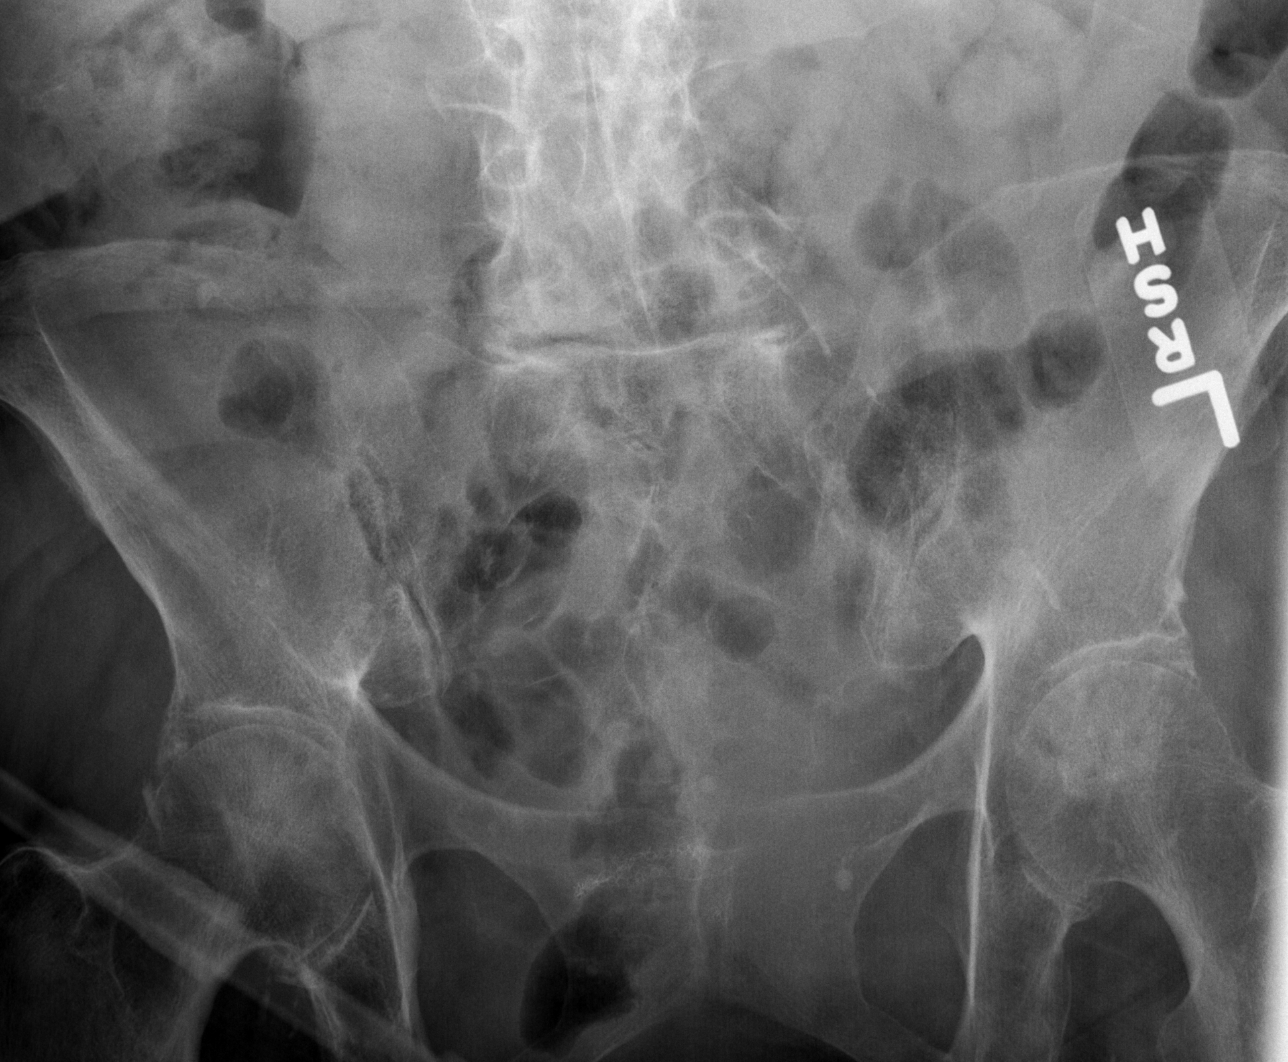

[t coccyx ap]
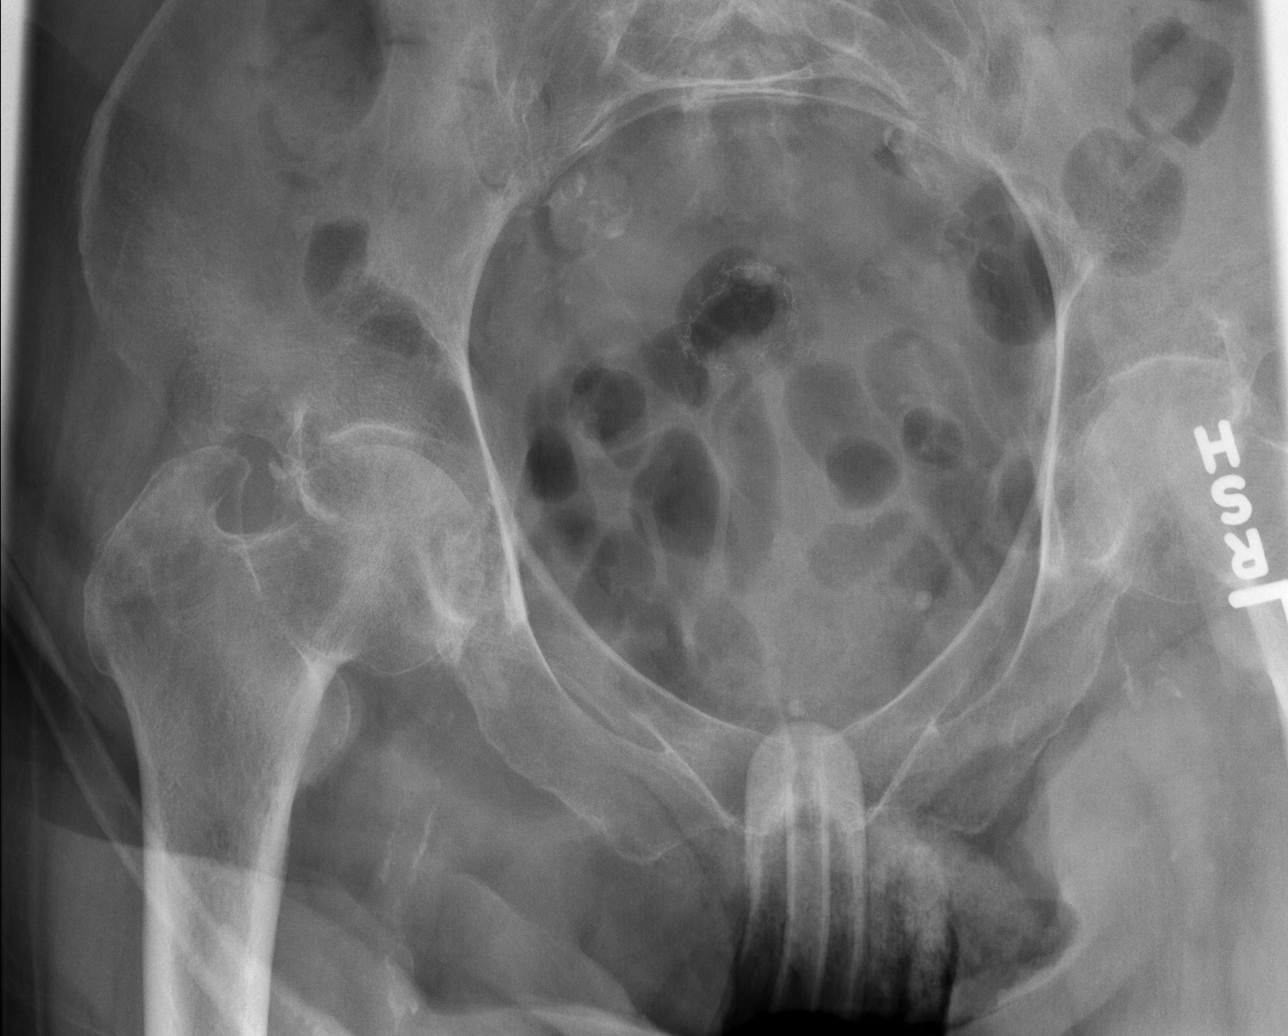

[t sacrum coccyx lat]
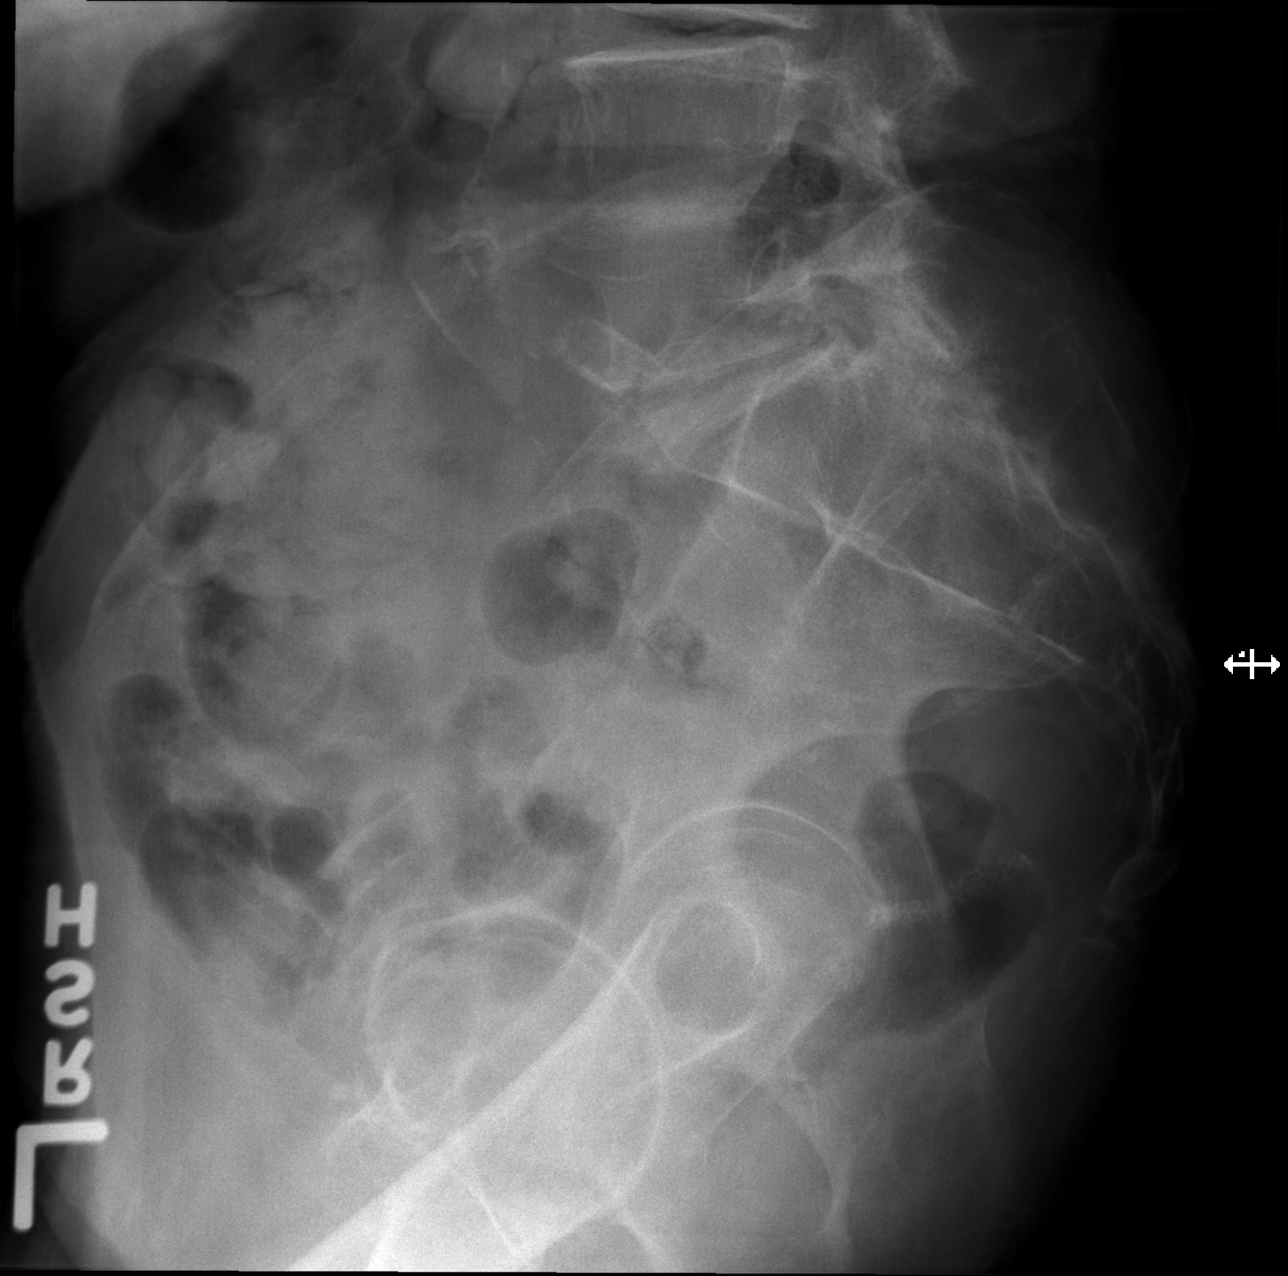

[3 of 3 positions shown; findings below may reference images not displayed]

FINDINGS: There is no evidence of fracture or other focal bone lesions.
Degenerative changes in the hips bilaterally and visualized lower
lumbar spine.
IMPRESSION: No acute bony abnormality.

## 2019-06-12 IMAGING — CT CT RENAL STONE PROTOCOL
2 of 4 series · 16 of 46 positions shown, 18 images · non-contrast
Comparison: No priors.

CLINICAL DATA: [AGE] female with history of lower back pain
since 04/13/2019. Currently being treated for urinary tract
infection.

EXAM:
CT ABDOMEN AND PELVIS WITHOUT CONTRAST
TECHNIQUE: Multidetector CT imaging of the abdomen and pelvis was performed
following the standard protocol without IV contrast.

[Series 1: a/p w/o 5mm · axial · non-contrast · 0.73mm/px · z∈[+809,+1154]mm · 13 of 77 slices shown, 15 images]
[im 4/77  soft-tissue]
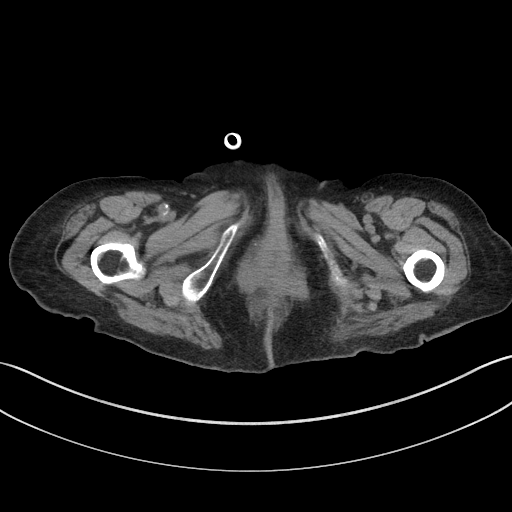
[im 4/77  bone]
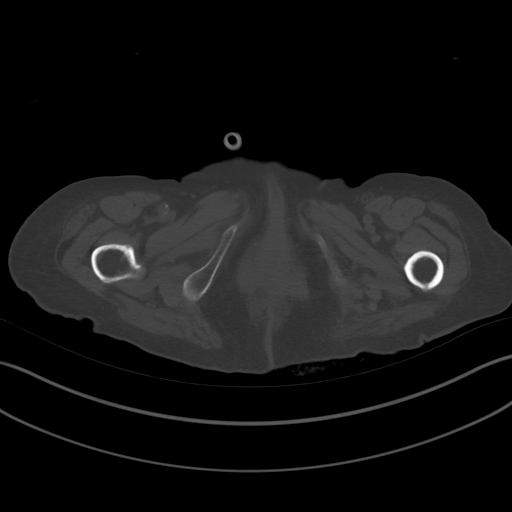
[im 10/77  soft-tissue]
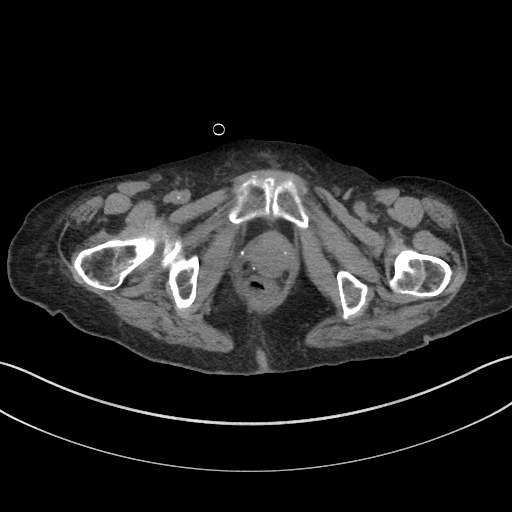
[im 16/77  soft-tissue]
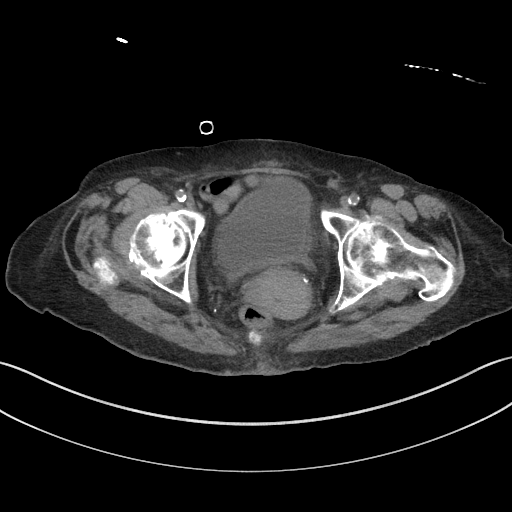
[im 23/77  soft-tissue]
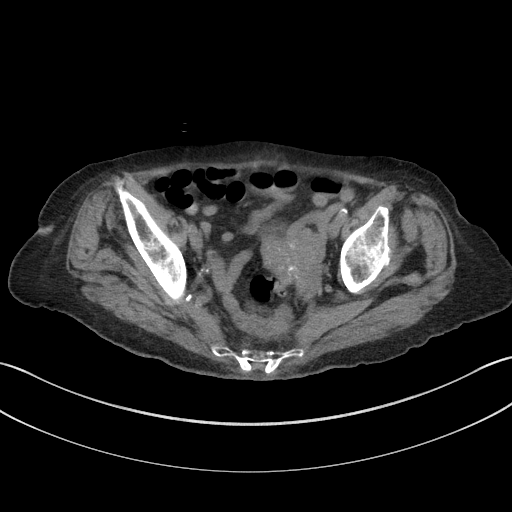
[im 26/77  soft-tissue]
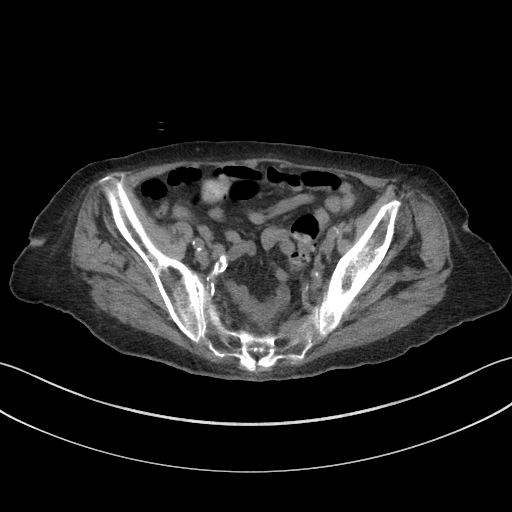
[im 32/77  soft-tissue]
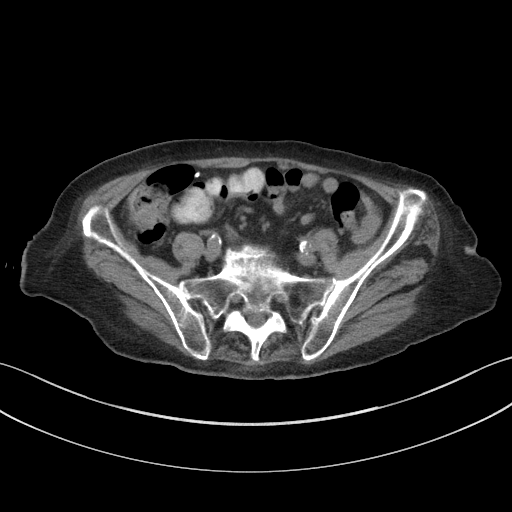
[im 39/77  soft-tissue]
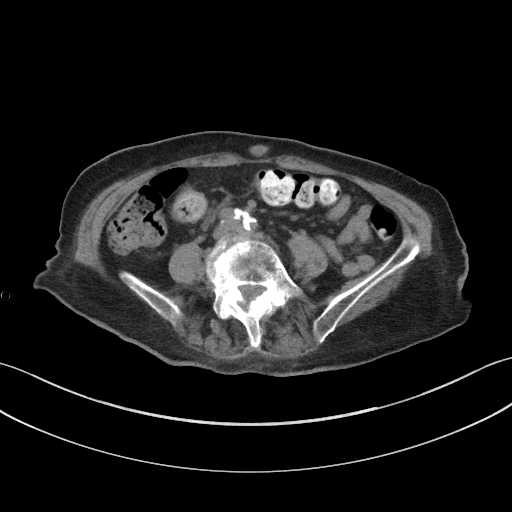
[im 45/77  soft-tissue]
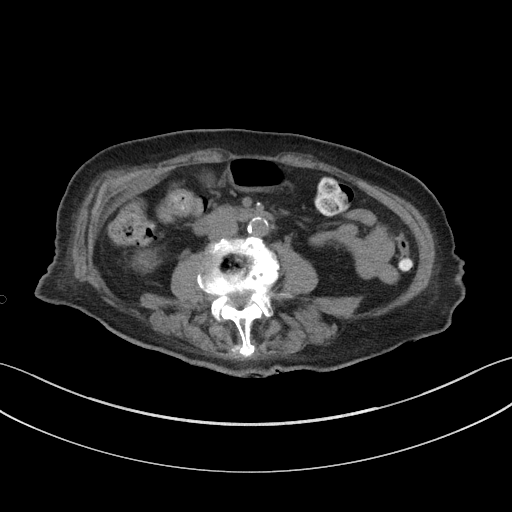
[im 51/77  soft-tissue]
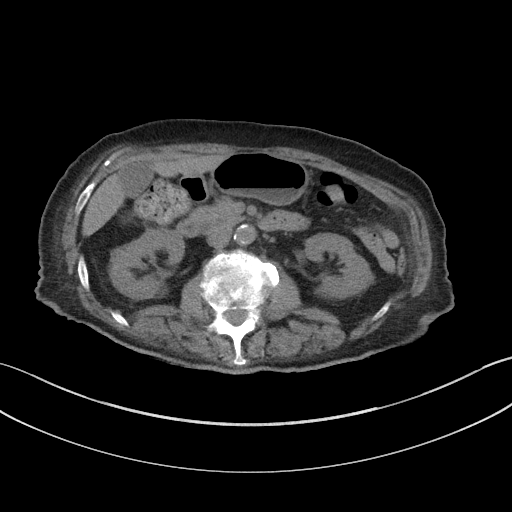
[im 51/77  bone]
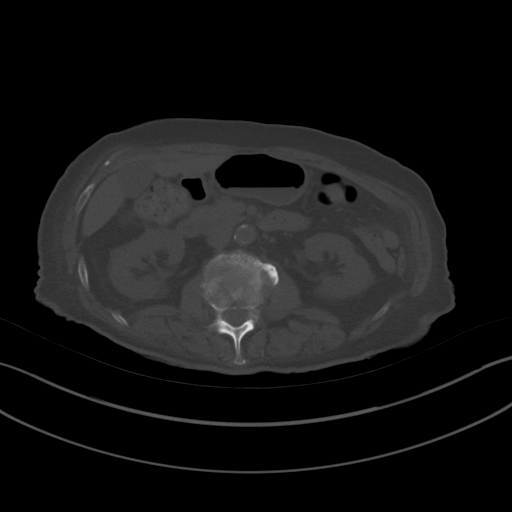
[im 54/77  soft-tissue]
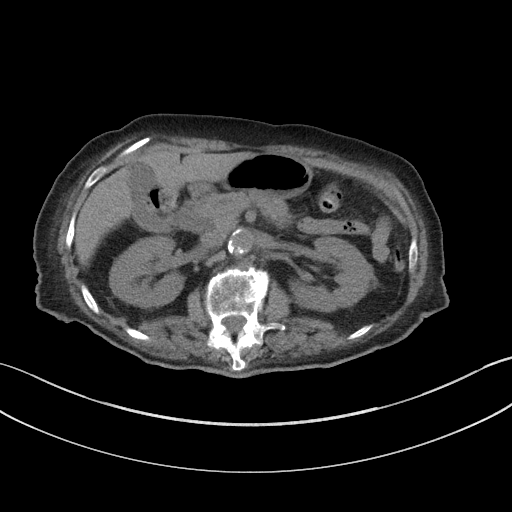
[im 61/77  soft-tissue]
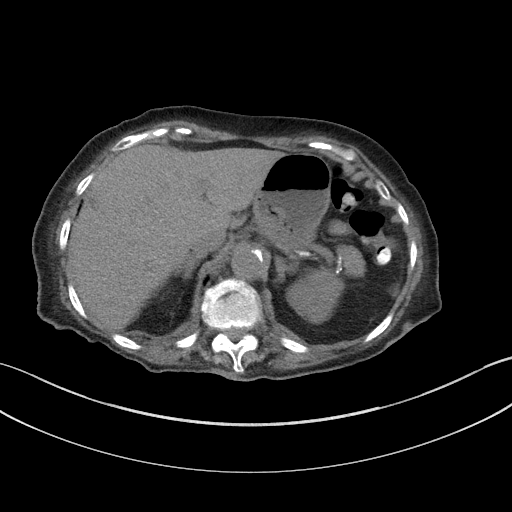
[im 67/77  soft-tissue]
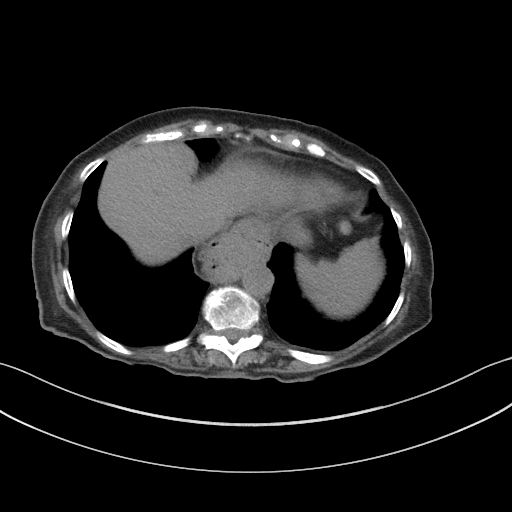
[im 73/77  soft-tissue]
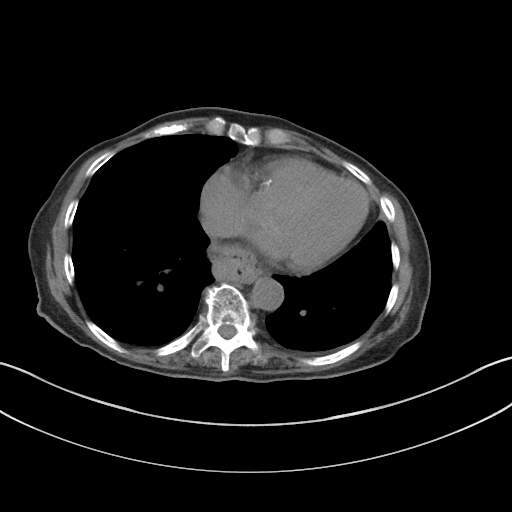

[Series 6: a/p w/o cor · coronal · non-contrast · 0.71mm/px · 3 of 97 slices shown]
[im 33/97  soft-tissue]
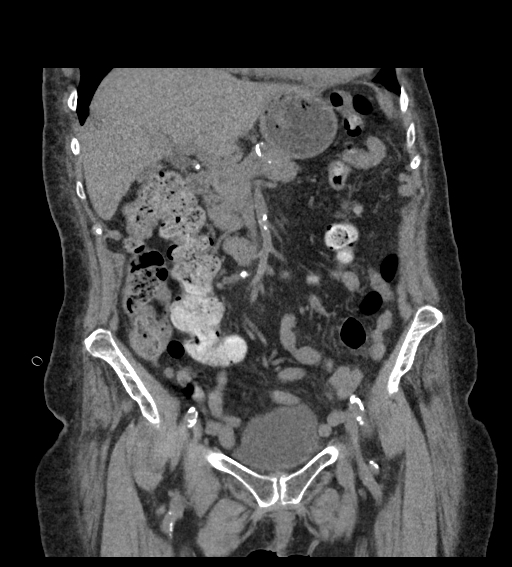
[im 43/97  soft-tissue]
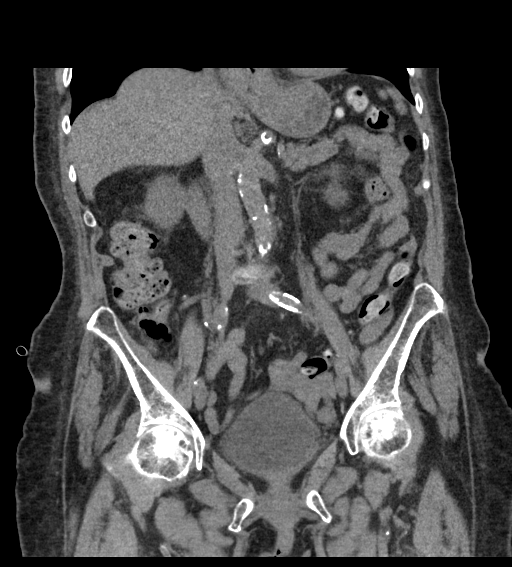
[im 54/97  soft-tissue]
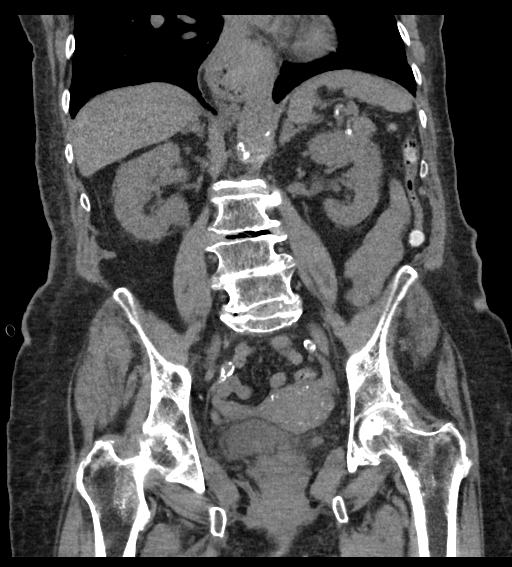

[16 of 46 positions shown; findings below may reference images not displayed]

FINDINGS: Lower chest: Large hiatal hernia. Atherosclerotic calcifications in
the descending thoracic aorta as well as the left anterior
descending and right coronary arteries. 3 mm subpleural nodule in
the posterior aspect of the left lower lobe, statistically likely a
benign subpleural lymph node.

Hepatobiliary: No definite suspicious cystic or solid hepatic
lesions are confidently identified on today's noncontrast CT
examination. Unenhanced appearance of the gallbladder is normal.

Pancreas: No definite pancreatic mass or peripancreatic fluid
collections or inflammatory changes noted on today's noncontrast CT
examination.

Spleen: Unremarkable.

Adrenals/Urinary Tract: There are no abnormal calcifications within
the collecting system of either kidney, along the course of either
ureter, or within the lumen of the urinary bladder. No
hydroureteronephrosis or perinephric stranding to suggest urinary
tract obstruction at this time. The unenhanced appearance of the
kidneys is unremarkable bilaterally. Unenhanced appearance of the
urinary bladder is unremarkable. Bilateral adrenal glands are normal
in appearance.

Stomach/Bowel: Unenhanced appearance of the intra-abdominal portion
of the stomach is unremarkable. No pathologic dilatation of small
bowel or colon. Normal appendix.

Vascular/Lymphatic: Aortic atherosclerosis. No lymphadenopathy noted
in the abdomen or pelvis.

Reproductive: Uterus and ovaries are atrophic. Small volume of fluid
in the vaginal canal.

Other: No significant volume of ascites.  No pneumoperitoneum.

Musculoskeletal: There are no aggressive appearing lytic or blastic
lesions noted in the visualized portions of the skeleton.
IMPRESSION: 1. No acute findings are noted in the abdomen or pelvis to account
for the patient's symptoms. Specifically, no urinary tract calculi
or findings of urinary tract obstruction are noted at this time.
2. Small volume of fluid in the vaginal canal. This is of uncertain
etiology and significance.
3. Large hiatal hernia.
4. Aortic atherosclerosis, in addition to at least 2 vessel coronary
artery disease.
5. Additional incidental findings, as above.

## 2019-06-12 IMAGING — CT CT LUMBAR SPINE WITHOUT CONTRAST
3 series · 16 of 33 positions shown, 19 images · non-contrast
Comparison: Lumbar spine radiographs 05/13/2016

CLINICAL DATA: Back pain since 04/13/2019

EXAM:
CT LUMBAR SPINE WITHOUT CONTRAST
TECHNIQUE: Multidetector CT imaging of the lumbar spine was performed without
intravenous contrast administration. Multiplanar CT image
reconstructions were also generated.

[Series 5: lspine st · axial · 0.37mm/px · z∈[+936,+1096]mm · 8 of 96 slices shown, 10 images]
[im 8/96  soft-tissue]
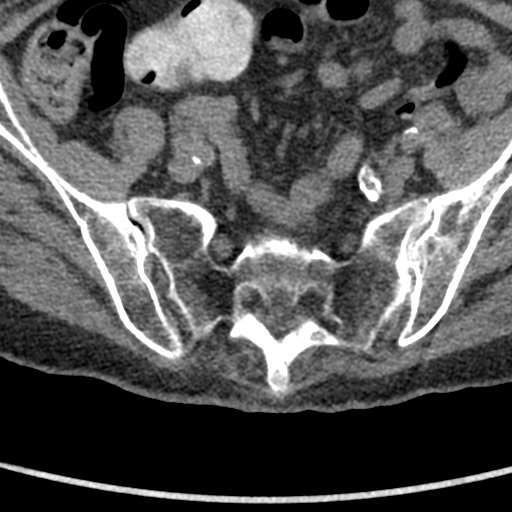
[im 8/96  bone]
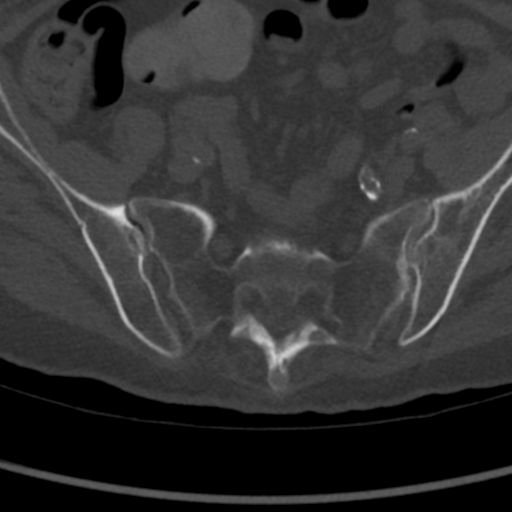
[im 22/96  bone]
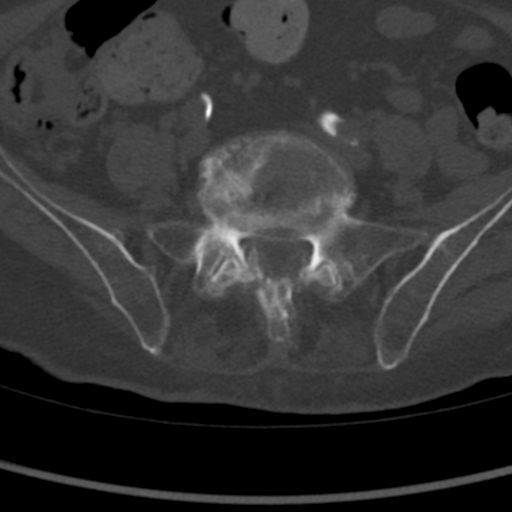
[im 30/96  bone]
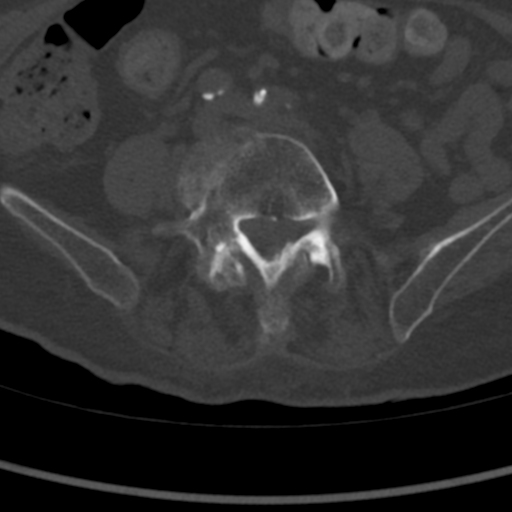
[im 44/96  bone]
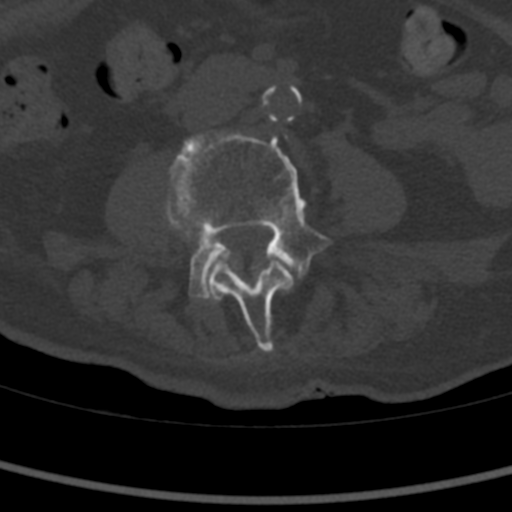
[im 52/96  soft-tissue]
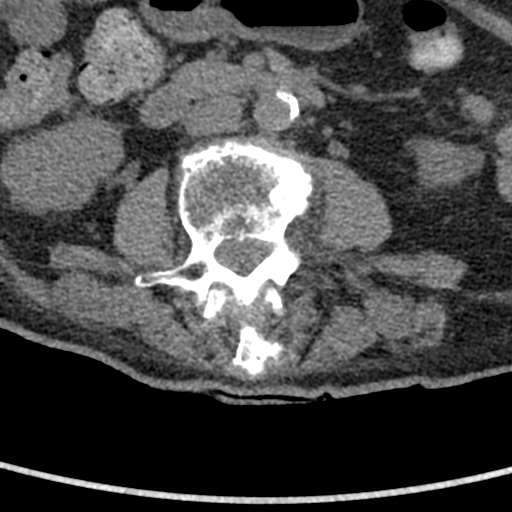
[im 52/96  bone]
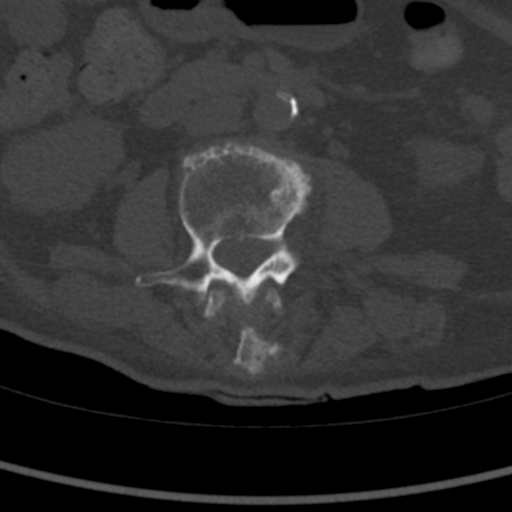
[im 66/96  bone]
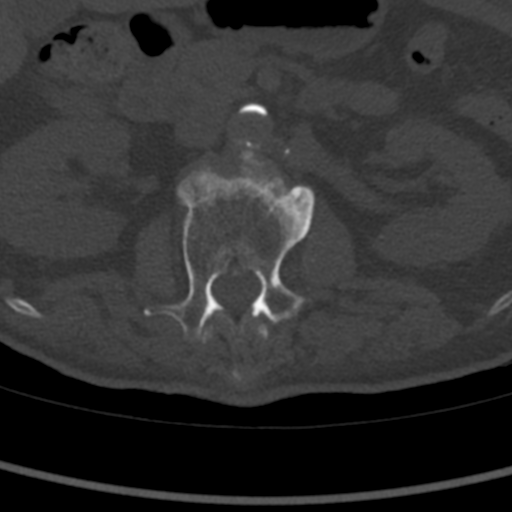
[im 74/96  bone]
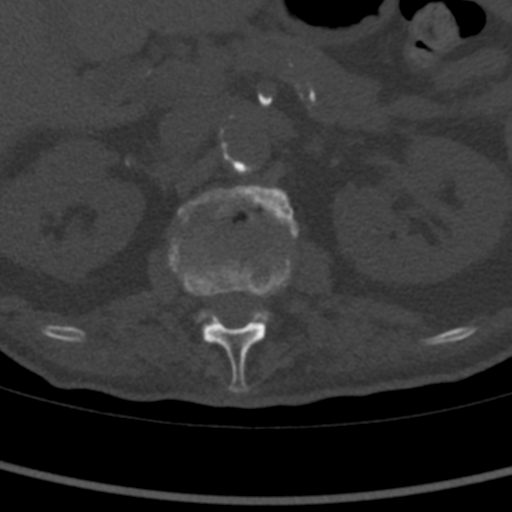
[im 88/96  bone]
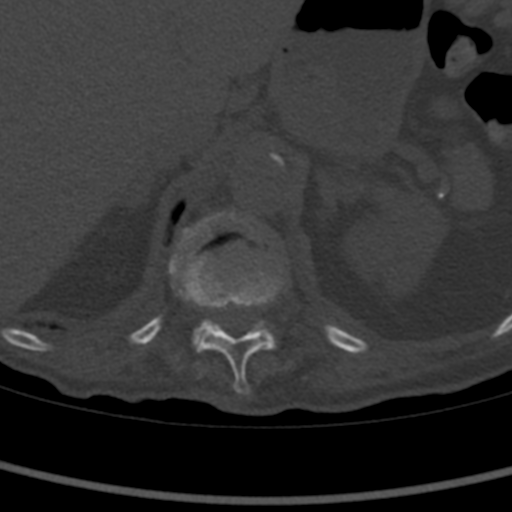

[Series 602: cor st · coronal · 0.37mm/px · 3 of 53 slices shown]
[im 11/53  bone]
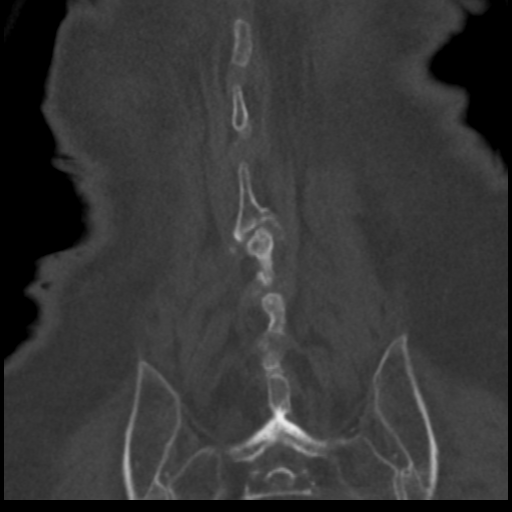
[im 21/53  bone]
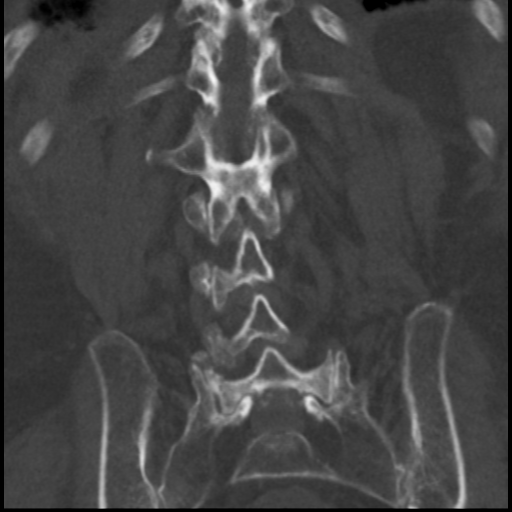
[im 32/53  bone]
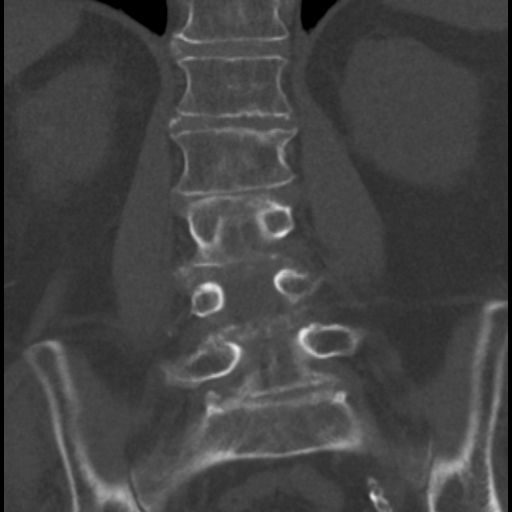

[Series 603: sag st · sagittal · 0.37mm/px · 5 of 62 slices shown, 6 images]
[im 21/62  bone]
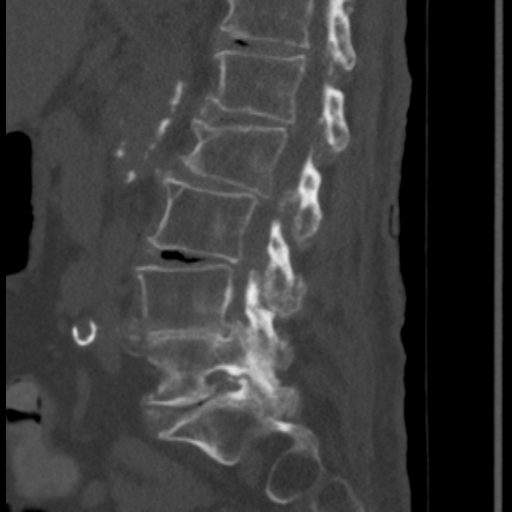
[im 26/62  bone]
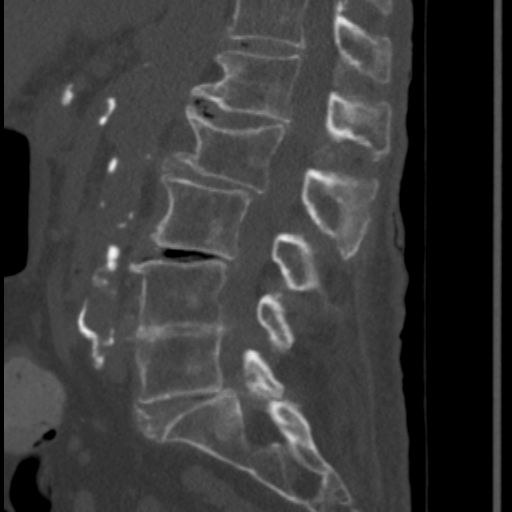
[im 31/62  soft-tissue]
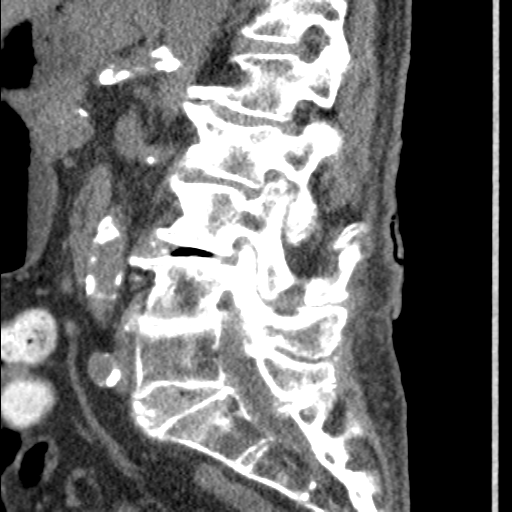
[im 31/62  bone]
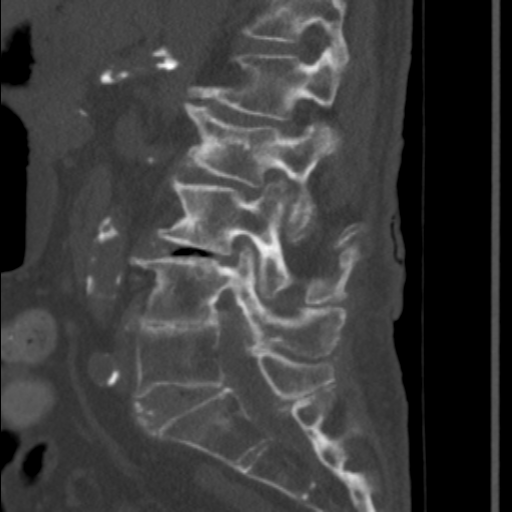
[im 36/62  bone]
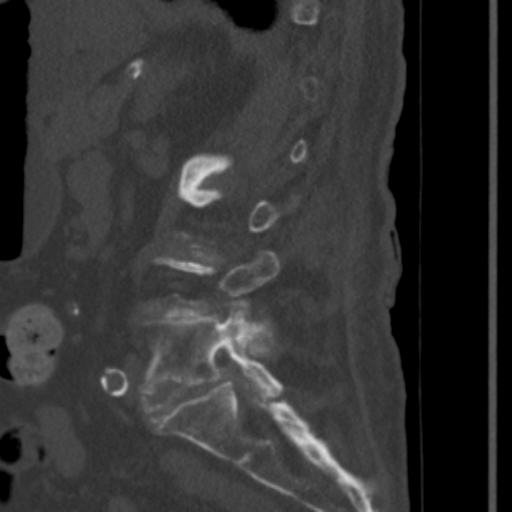
[im 41/62  bone]
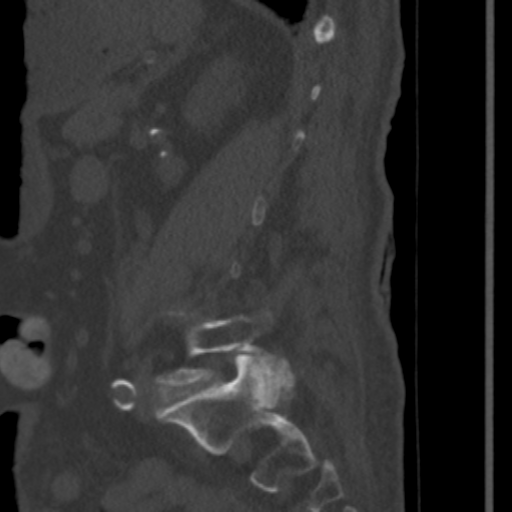

[16 of 33 positions shown; findings below may reference images not displayed]

FINDINGS: Segmentation: There are five lumbar type vertebral bodies. The last
full intervertebral disc space is labeled L5-S1.

Alignment: Scoliosis and degenerative lumbar spondylosis with
multilevel disc disease and facet disease. Mild degenerative
retrolisthesis of L2 is noted.

Vertebrae: Moderate age related osteoporosis. There is a remote
anterior wedge like compression deformity of L2. No definite acute
lumbar spine fractures. The facets are normally aligned. Advanced
facet disease but no obvious pars defects.

Paraspinal and other soft tissues: No significant paraspinal or
retroperitoneal findings. Advanced vascular calcifications are noted
but no aortic aneurysm.

Disc levels: L1-2: No significant findings.

L2-3: Slight bulging uncovered disc but the spinal canal is generous
and there is no significant spinal, lateral recess or foraminal
stenosis.

L3-4: Disc disease and facet disease with a bulging degenerated
annulus but no significant spinal, lateral recess or foraminal
stenosis.

L4-5: Near complete auto interbody fusion at L4-5 with mild
osteophytic ridging. There is also fairly significant facet disease,
right greater than left contributing to bilateral lateral recess
stenosis, right greater than left. No significant foraminal
stenosis.

L5-S1: Advanced facet disease and moderate disc disease but no
significant spinal or foraminal stenosis.
IMPRESSION: 1. Scoliosis and degenerative lumbar spondylosis with multilevel
disc disease and facet disease.
2. No acute bony findings.  Remote compression deformity of L2.
3. Mild bilateral lateral recess stenosis at L4-5, right greater
than left.

## 2019-06-13 DIAGNOSIS — E1140 Type 2 diabetes mellitus with diabetic neuropathy, unspecified: Secondary | ICD-10-CM | POA: Diagnosis not present

## 2019-06-13 DIAGNOSIS — F0390 Unspecified dementia without behavioral disturbance: Secondary | ICD-10-CM | POA: Diagnosis not present

## 2019-06-13 DIAGNOSIS — E1151 Type 2 diabetes mellitus with diabetic peripheral angiopathy without gangrene: Secondary | ICD-10-CM | POA: Diagnosis not present

## 2019-06-13 DIAGNOSIS — I110 Hypertensive heart disease with heart failure: Secondary | ICD-10-CM | POA: Diagnosis not present

## 2019-06-13 DIAGNOSIS — I2510 Atherosclerotic heart disease of native coronary artery without angina pectoris: Secondary | ICD-10-CM | POA: Diagnosis not present

## 2019-06-13 DIAGNOSIS — I5020 Unspecified systolic (congestive) heart failure: Secondary | ICD-10-CM | POA: Diagnosis not present

## 2019-06-16 DIAGNOSIS — I110 Hypertensive heart disease with heart failure: Secondary | ICD-10-CM | POA: Diagnosis not present

## 2019-06-16 DIAGNOSIS — I5020 Unspecified systolic (congestive) heart failure: Secondary | ICD-10-CM | POA: Diagnosis not present

## 2019-06-16 DIAGNOSIS — F0390 Unspecified dementia without behavioral disturbance: Secondary | ICD-10-CM | POA: Diagnosis not present

## 2019-06-16 DIAGNOSIS — I2510 Atherosclerotic heart disease of native coronary artery without angina pectoris: Secondary | ICD-10-CM | POA: Diagnosis not present

## 2019-06-16 DIAGNOSIS — E1140 Type 2 diabetes mellitus with diabetic neuropathy, unspecified: Secondary | ICD-10-CM | POA: Diagnosis not present

## 2019-06-16 DIAGNOSIS — E1151 Type 2 diabetes mellitus with diabetic peripheral angiopathy without gangrene: Secondary | ICD-10-CM | POA: Diagnosis not present

## 2019-06-17 ENCOUNTER — Other Ambulatory Visit: Payer: Self-pay

## 2019-06-17 DIAGNOSIS — F0150 Vascular dementia without behavioral disturbance: Secondary | ICD-10-CM

## 2019-06-17 DIAGNOSIS — J302 Other seasonal allergic rhinitis: Secondary | ICD-10-CM

## 2019-06-17 DIAGNOSIS — F0280 Dementia in other diseases classified elsewhere without behavioral disturbance: Secondary | ICD-10-CM

## 2019-06-17 DIAGNOSIS — I10 Essential (primary) hypertension: Secondary | ICD-10-CM

## 2019-06-17 MED ORDER — TRAZODONE HCL 100 MG PO TABS: 100.0000 mg | ORAL_TABLET | Freq: Every evening | ORAL | 2 refills | Status: DC | PRN

## 2019-06-17 MED ORDER — LORATADINE 10 MG PO TABS: 5.0000 mg | ORAL_TABLET | Freq: Every day | ORAL | 2 refills | Status: DC | PRN

## 2019-06-17 MED ORDER — LOSARTAN POTASSIUM 50 MG PO TABS: 50.0000 mg | ORAL_TABLET | Freq: Every day | ORAL | 3 refills | Status: DC

## 2019-06-18 DIAGNOSIS — I110 Hypertensive heart disease with heart failure: Secondary | ICD-10-CM | POA: Diagnosis not present

## 2019-06-18 DIAGNOSIS — I2510 Atherosclerotic heart disease of native coronary artery without angina pectoris: Secondary | ICD-10-CM | POA: Diagnosis not present

## 2019-06-18 DIAGNOSIS — F0390 Unspecified dementia without behavioral disturbance: Secondary | ICD-10-CM | POA: Diagnosis not present

## 2019-06-18 DIAGNOSIS — E1140 Type 2 diabetes mellitus with diabetic neuropathy, unspecified: Secondary | ICD-10-CM | POA: Diagnosis not present

## 2019-06-18 DIAGNOSIS — I5020 Unspecified systolic (congestive) heart failure: Secondary | ICD-10-CM | POA: Diagnosis not present

## 2019-06-18 DIAGNOSIS — E1151 Type 2 diabetes mellitus with diabetic peripheral angiopathy without gangrene: Secondary | ICD-10-CM | POA: Diagnosis not present

## 2019-06-19 DIAGNOSIS — I110 Hypertensive heart disease with heart failure: Secondary | ICD-10-CM | POA: Diagnosis not present

## 2019-06-19 DIAGNOSIS — I2510 Atherosclerotic heart disease of native coronary artery without angina pectoris: Secondary | ICD-10-CM | POA: Diagnosis not present

## 2019-06-19 DIAGNOSIS — E1151 Type 2 diabetes mellitus with diabetic peripheral angiopathy without gangrene: Secondary | ICD-10-CM | POA: Diagnosis not present

## 2019-06-19 DIAGNOSIS — F0390 Unspecified dementia without behavioral disturbance: Secondary | ICD-10-CM | POA: Diagnosis not present

## 2019-06-19 DIAGNOSIS — I5020 Unspecified systolic (congestive) heart failure: Secondary | ICD-10-CM | POA: Diagnosis not present

## 2019-06-19 DIAGNOSIS — E1140 Type 2 diabetes mellitus with diabetic neuropathy, unspecified: Secondary | ICD-10-CM | POA: Diagnosis not present

## 2019-06-23 DIAGNOSIS — I110 Hypertensive heart disease with heart failure: Secondary | ICD-10-CM | POA: Diagnosis not present

## 2019-06-23 DIAGNOSIS — F0390 Unspecified dementia without behavioral disturbance: Secondary | ICD-10-CM | POA: Diagnosis not present

## 2019-06-23 DIAGNOSIS — I2510 Atherosclerotic heart disease of native coronary artery without angina pectoris: Secondary | ICD-10-CM | POA: Diagnosis not present

## 2019-06-23 DIAGNOSIS — E1140 Type 2 diabetes mellitus with diabetic neuropathy, unspecified: Secondary | ICD-10-CM | POA: Diagnosis not present

## 2019-06-23 DIAGNOSIS — E1151 Type 2 diabetes mellitus with diabetic peripheral angiopathy without gangrene: Secondary | ICD-10-CM | POA: Diagnosis not present

## 2019-06-23 DIAGNOSIS — I5020 Unspecified systolic (congestive) heart failure: Secondary | ICD-10-CM | POA: Diagnosis not present

## 2019-06-23 IMAGING — DX PORTABLE CHEST - 1 VIEW
1 series · 1 of 1 positions shown · non-contrast
Comparison: 01/24/2019 and 11/27/2018.

CLINICAL DATA: Chest pain today and cough for the past week.

EXAM:
PORTABLE CHEST 1 VIEW

[chest ap]
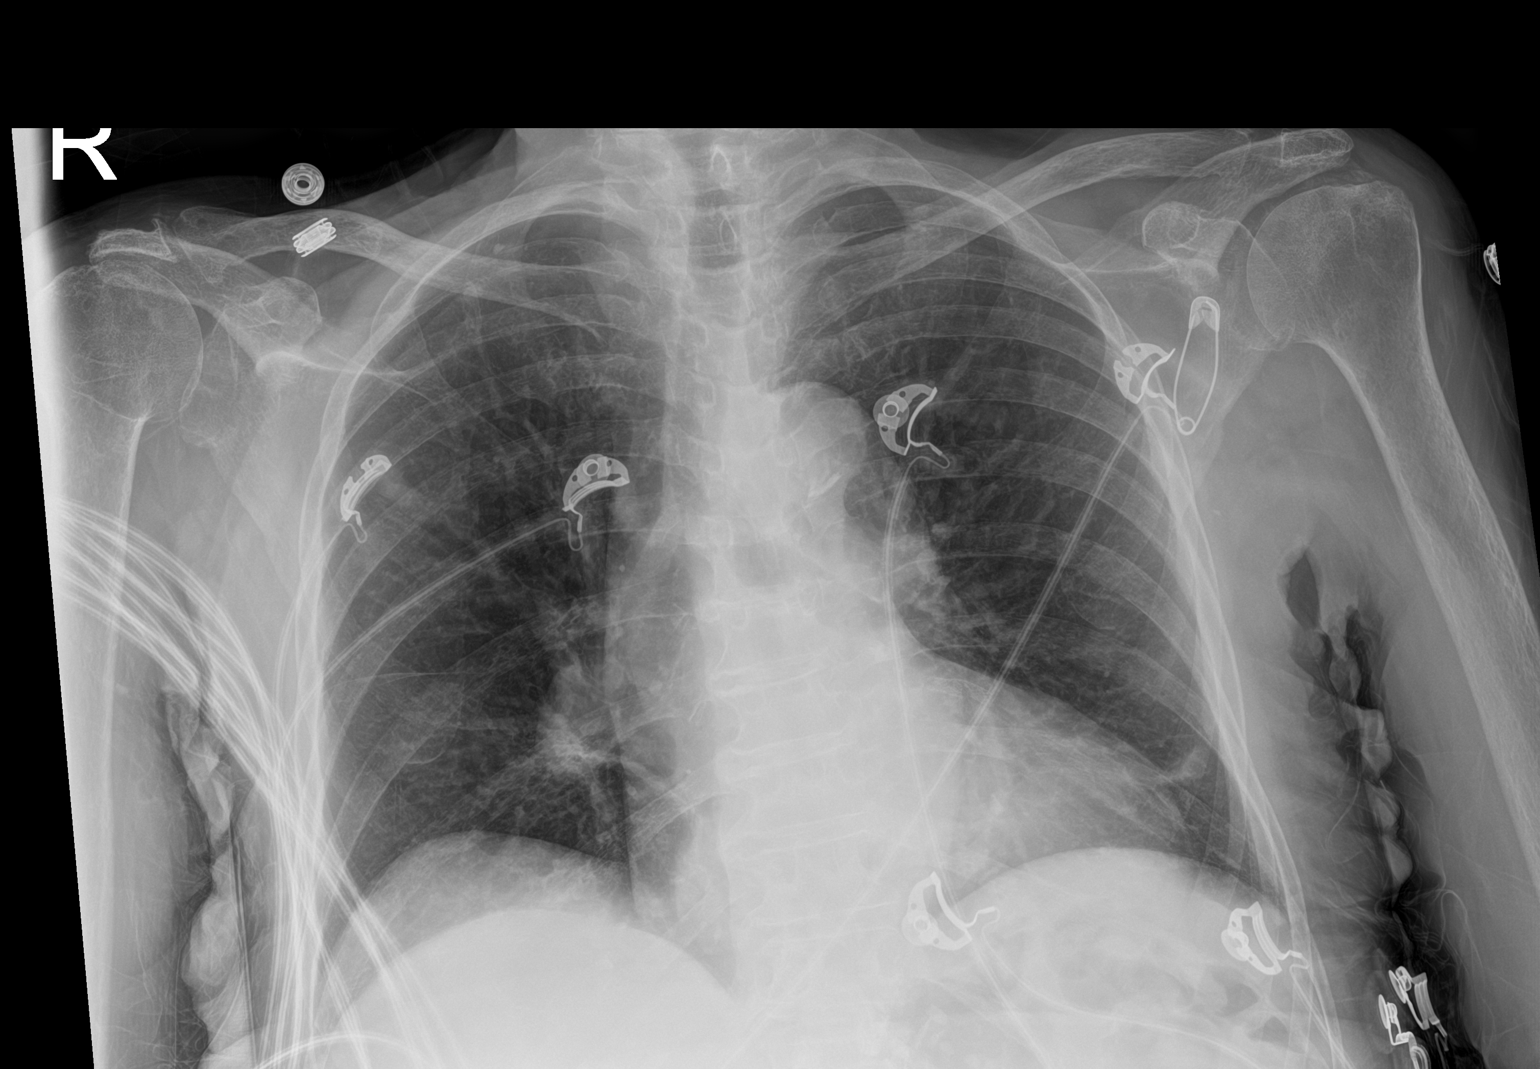

[1 of 1 positions shown; findings below may reference images not displayed]

FINDINGS: Normal sized heart. Tortuous and partially calcified thoracic aorta.
Interval somewhat linear nodular density in the left lower lung zone
in similar interval density in the right upper lung zone. The latter
is partially obscured by an overlying cardiac lead. Small amount of
interval curvilinear atelectasis or scarring at the medial right
lung base. Diffuse osteopenia. Left rotator cuff calcifications.
Superior migration of the right humeral head with some bony
remodeling. Thoracic spine degenerative changes.
IMPRESSION: 1. Interval somewhat linear nodular density in the left lower lung
zone and similar interval density in the right upper lung zone.
Follow-up PA and lateral chest radiographs are recommended when
possible.
2. Interval curvilinear atelectasis or scarring at the medial right
lung base.
3. Left rotator cuff calcific tendinitis and chronic right rotator
cuff tear.

## 2019-06-27 DIAGNOSIS — F0390 Unspecified dementia without behavioral disturbance: Secondary | ICD-10-CM | POA: Diagnosis not present

## 2019-06-27 DIAGNOSIS — I110 Hypertensive heart disease with heart failure: Secondary | ICD-10-CM | POA: Diagnosis not present

## 2019-06-27 DIAGNOSIS — I5020 Unspecified systolic (congestive) heart failure: Secondary | ICD-10-CM | POA: Diagnosis not present

## 2019-06-27 DIAGNOSIS — E1140 Type 2 diabetes mellitus with diabetic neuropathy, unspecified: Secondary | ICD-10-CM | POA: Diagnosis not present

## 2019-06-27 DIAGNOSIS — E1151 Type 2 diabetes mellitus with diabetic peripheral angiopathy without gangrene: Secondary | ICD-10-CM | POA: Diagnosis not present

## 2019-06-27 DIAGNOSIS — I2510 Atherosclerotic heart disease of native coronary artery without angina pectoris: Secondary | ICD-10-CM | POA: Diagnosis not present

## 2019-07-03 ENCOUNTER — Other Ambulatory Visit: Payer: Self-pay

## 2019-07-03 DIAGNOSIS — F0150 Vascular dementia without behavioral disturbance: Secondary | ICD-10-CM

## 2019-07-03 DIAGNOSIS — F0280 Dementia in other diseases classified elsewhere without behavioral disturbance: Secondary | ICD-10-CM

## 2019-07-03 DIAGNOSIS — I10 Essential (primary) hypertension: Secondary | ICD-10-CM

## 2019-07-05 MED ORDER — DONEPEZIL HCL 10 MG PO TABS: 10.0000 mg | ORAL_TABLET | Freq: Every day | ORAL | 0 refills | Status: DC

## 2019-07-05 MED ORDER — LOSARTAN POTASSIUM 50 MG PO TABS: 50.0000 mg | ORAL_TABLET | Freq: Every day | ORAL | 3 refills | Status: DC

## 2019-07-05 NOTE — Telephone Encounter (Signed)
Spoke to Daughter and caregiver [name] concerning Aricept. She notes she had some improvement in her forgetfulness with the Aricept 5mg  but not much. Discussed increasing dose to 10mg . She was open to this. Will do trial of Aricept 10mg  x 2 month. If no improvement can do trial of Namenda.

## 2019-07-11 ENCOUNTER — Other Ambulatory Visit: Payer: Self-pay | Admitting: Family Medicine

## 2019-07-11 DIAGNOSIS — F0150 Vascular dementia without behavioral disturbance: Secondary | ICD-10-CM

## 2019-07-11 DIAGNOSIS — F0280 Dementia in other diseases classified elsewhere without behavioral disturbance: Secondary | ICD-10-CM

## 2019-07-11 DIAGNOSIS — G4701 Insomnia due to medical condition: Secondary | ICD-10-CM

## 2019-07-15 ENCOUNTER — Other Ambulatory Visit: Payer: Self-pay

## 2019-07-15 ENCOUNTER — Telehealth (INDEPENDENT_AMBULATORY_CARE_PROVIDER_SITE_OTHER): Payer: Medicare Other | Admitting: Family Medicine

## 2019-07-15 DIAGNOSIS — R059 Cough, unspecified: Secondary | ICD-10-CM

## 2019-07-15 DIAGNOSIS — R05 Cough: Secondary | ICD-10-CM | POA: Diagnosis not present

## 2019-07-15 MED ORDER — AZITHROMYCIN 250 MG PO TABS: ORAL_TABLET | ORAL | 0 refills | Status: DC

## 2019-07-15 MED ORDER — ALBUTEROL SULFATE HFA 108 (90 BASE) MCG/ACT IN AERS: 2.0000 | INHALATION_SPRAY | Freq: Four times a day (QID) | RESPIRATORY_TRACT | 2 refills | Status: AC | PRN

## 2019-07-15 MED ORDER — PANTOPRAZOLE SODIUM 40 MG PO TBEC: 40.0000 mg | DELAYED_RELEASE_TABLET | Freq: Every day | ORAL | 1 refills | Status: DC

## 2019-07-15 NOTE — Progress Notes (Signed)
[facility] Tele[facility] Visit I connected with  [name] on 07/15/19 by a video enabled telemedicine application and verified that I am speaking with the correct person using two identifiers.   I discussed the limitations of evaluation and management by telemedicine. The patient expressed understanding and agreed to proceed.  Patient consented to have virtual visit. Method of visit: Telephone  Encounter participants: Patient: [name] - located at Home Provider: [name] - located at [facility] Others (if applicable): [name], daughter ([place])  Chief Complaint: Cough  HPI: Cough Daughter reports patient is SOB due to increased mucous. Symptoms have been present for 2 weeks. Not getting better. Could not find her inhaler, usually is in her drawer but was not there. RN came out and saw her and Temp was 99.5, BP was slightly elevated but not too high, RN said everything was ok. Patient is spitting up mucous. Has been using robotussin PRN, but is not helping as much. Today she couldn't breathe. O2 sats were wnl. Sugars were wnl. Daughter reports increased cough. Taking allergy pills (loratadine) as well. Has not tried flonase. Has not been using her protonix, has been out for 2 months.   ROS: per HPI  Pertinent PMHx: HFpEF, Asthma, GERD, seasonal allergies   Exam:  Respiratory: Speaking full sentences, no increased work of breathing, no respiratory distress  Assessment/Plan:  Cough Patient with cough with increased mucus production.  Community-acquired pneumonia versus viral etiology.  Unable to appreciate lung exam due to telephone visit.  Temperature at home was 99.5.  Patient was speaking normally on phone which is reassuring.  O2 sats were measured at home which were within normal limits, daughter unable to remember the number but states that RN told her this was a normal level.  Given concern for possible pneumonia we will plan to treat with  azithromycin.  Have also refilled albuterol inhaler.  Have also refilled Protonix as patient has a history of GERD and has not been on medications, this may exacerbate her condition.  Strict return precautions given.  Advised to follow-up in 1 week if no improvement, sooner if worsening.  Advised to go the emergency department if worsening shortness of breath.    Time spent during visit with patient: 11 minutes

## 2019-07-15 NOTE — Assessment & Plan Note (Signed)
Patient with cough with increased mucus production.  Community-acquired pneumonia versus viral etiology.  Unable to appreciate lung exam due to telephone visit.  Temperature at home was 99.5.  Patient was speaking normally on phone which is reassuring.  O2 sats were measured at home which were within normal limits, daughter unable to remember the number but states that RN told her this was a normal level.  Given concern for possible pneumonia we will plan to treat with azithromycin.  Have also refilled albuterol inhaler.  Have also refilled Protonix as patient has a history of GERD and has not been on medications, this may exacerbate her condition.  Strict return precautions given.  Advised to follow-up in 1 week if no improvement, sooner if worsening.  Advised to go the emergency department if worsening shortness of breath.

## 2019-07-28 ENCOUNTER — Ambulatory Visit: Payer: Medicare Other | Admitting: Podiatry

## 2019-08-05 ENCOUNTER — Encounter (HOSPITAL_COMMUNITY): Payer: Self-pay

## 2019-08-05 ENCOUNTER — Other Ambulatory Visit: Payer: Self-pay

## 2019-08-05 ENCOUNTER — Emergency Department (HOSPITAL_COMMUNITY)
Admission: EM | Admit: 2019-08-05 | Discharge: 2019-08-05 | Disposition: A | Discharge status: Home / Self Care | Payer: Medicare Other | Attending: Emergency Medicine | Admitting: Emergency Medicine

## 2019-08-05 DIAGNOSIS — Z96651 Presence of right artificial knee joint: Secondary | ICD-10-CM | POA: Insufficient documentation

## 2019-08-05 DIAGNOSIS — M5489 Other dorsalgia: Secondary | ICD-10-CM | POA: Diagnosis not present

## 2019-08-05 DIAGNOSIS — I5020 Unspecified systolic (congestive) heart failure: Secondary | ICD-10-CM | POA: Insufficient documentation

## 2019-08-05 DIAGNOSIS — Z79899 Other long term (current) drug therapy: Secondary | ICD-10-CM | POA: Insufficient documentation

## 2019-08-05 DIAGNOSIS — F0390 Unspecified dementia without behavioral disturbance: Secondary | ICD-10-CM | POA: Diagnosis not present

## 2019-08-05 DIAGNOSIS — Z853 Personal history of malignant neoplasm of breast: Secondary | ICD-10-CM | POA: Diagnosis not present

## 2019-08-05 DIAGNOSIS — R52 Pain, unspecified: Secondary | ICD-10-CM | POA: Diagnosis not present

## 2019-08-05 DIAGNOSIS — I2510 Atherosclerotic heart disease of native coronary artery without angina pectoris: Secondary | ICD-10-CM | POA: Insufficient documentation

## 2019-08-05 DIAGNOSIS — I10 Essential (primary) hypertension: Secondary | ICD-10-CM | POA: Diagnosis not present

## 2019-08-05 DIAGNOSIS — N390 Urinary tract infection, site not specified: Secondary | ICD-10-CM | POA: Diagnosis not present

## 2019-08-05 DIAGNOSIS — F17220 Nicotine dependence, chewing tobacco, uncomplicated: Secondary | ICD-10-CM | POA: Diagnosis not present

## 2019-08-05 DIAGNOSIS — Z7982 Long term (current) use of aspirin: Secondary | ICD-10-CM | POA: Diagnosis not present

## 2019-08-05 DIAGNOSIS — R404 Transient alteration of awareness: Secondary | ICD-10-CM | POA: Diagnosis not present

## 2019-08-05 DIAGNOSIS — Z85038 Personal history of other malignant neoplasm of large intestine: Secondary | ICD-10-CM | POA: Diagnosis not present

## 2019-08-05 DIAGNOSIS — I110 Hypertensive heart disease with heart failure: Secondary | ICD-10-CM | POA: Diagnosis not present

## 2019-08-05 DIAGNOSIS — J45909 Unspecified asthma, uncomplicated: Secondary | ICD-10-CM | POA: Insufficient documentation

## 2019-08-05 DIAGNOSIS — M549 Dorsalgia, unspecified: Secondary | ICD-10-CM | POA: Diagnosis present

## 2019-08-05 LAB — URINALYSIS, ROUTINE W REFLEX MICROSCOPIC
Bilirubin Urine: NEGATIVE
Glucose, UA: NEGATIVE mg/dL
Ketones, ur: NEGATIVE mg/dL
Nitrite: NEGATIVE
Protein, ur: 100 mg/dL — AB
Specific Gravity, Urine: 1.015 (ref 1.005–1.030)
WBC, UA: >50 WBC/hpf — ABNORMAL HIGH (ref 0–5)
pH: 5 (ref 5.0–8.0)

## 2019-08-05 MED ORDER — CEPHALEXIN 500 MG PO CAPS
500.0000 mg | ORAL_CAPSULE | Freq: Once | ORAL | Status: AC
  Administered 2019-08-05: 500 mg via ORAL
  Filled 2019-08-05: qty 1

## 2019-08-05 MED ORDER — CEPHALEXIN 500 MG PO CAPS: 500.0000 mg | ORAL_CAPSULE | Freq: Two times a day (BID) | ORAL | 0 refills | Status: AC

## 2019-08-05 NOTE — ED Triage Notes (Signed)
Pt has HX of Dementia.

## 2019-08-05 NOTE — ED Provider Notes (Signed)
[facility] DEPT Provider Note   CSN: [id number] Arrival date & time: 08/05/19  1455     History   Chief Complaint Chief Complaint  Patient presents with  . Back Pain  . Urinary Tract Infection    HPI [name] is a 85 y.o. female.     85yo F w/ extensive PMH below including dementia who p/w back pain. Pt reportedly has chronic back pain, however she has complained of more LBP over the past 2 days. Family notes that her urine has been malodorous. In the past, these symptoms have been due to UTI. No known fevers. Pt herself states that she only has pain in her back, no abd pain.   LEVEL 5 CAVEAT DUE TO DEMENTIA  The history is provided by a relative.  Back Pain Urinary Tract Infection   Past Medical History:  Diagnosis Date  . Allergy   . Anemia    Iron def anemia  . Arthritis   . Asthma   . Breast cancer ([place]) 10/2010   Invasive Ductal Carcinoma, s/p bilateral mastectomy, now on Tamoxifen. Followed by Dr Jamse Arn.   . Breast cancer ([place]) 2011   s/p Bilateral masectomy  . CAD (coronary artery disease)   . Carotid artery aneurysm ([place]) 08/2010   right ICA, 5 x 82mm  . CHF (congestive heart failure) (HCC)    Systolic   . Colon cancer ([place])   . Colon cancer ([place]) 12/11/2013  . DDD (degenerative disc disease), cervical   . Diabetes mellitus   . Diverticulosis   . GERD (gastroesophageal reflux disease)   . H/O: GI bleed   . Hiatal hernia   . History of colon cancer   . Hypercalcemia 06/09/2014  . Hyperlipidemia   . Hypertension   . Myocardial infarction ([place])   . Neuropathy   . Pneumonia 03/2012  . Pressure injury of heel, stage 2 ([place]) 08/31/2016  . Shortness of breath     Patient Active Problem List   Diagnosis Date Noted  . Constipation 01/29/2019  . Chronic breast pain 12/26/2018  . Pain in gums 12/26/2018  . History of deep vein thrombosis 10/17/2018  . Severe protein-calorie malnutrition Altamease Oiler: less than 60% of  standard weight) ([place]) 10/17/2018  . Disorder associated with well-controlled type 2 diabetes mellitus ([place]) 10/17/2018  . History of pulmonary embolus (PE) 10/17/2018  . Hypomagnesemia 08/27/2018  . Left ventricular hypertrophy by electrocardiogram 08/27/2018  . Atypical chest pain 08/26/2018  . Advanced nonexudative age-related macular degeneration of both eyes without subfoveal involvement 07/30/2018  . Altered mental status 06/06/2018  . Microcytic anemia   . TIA (transient ischemic attack) 06/03/2018  . Mobility impaired 04/26/2018  . Protein-calorie malnutrition, severe 02/06/2017  . Urinary tract infection without hematuria 02/03/2017  . Hypokalemia 10/13/2016  . Pressure injury of heel, stage 2 ([place]) 08/31/2016  . Pulmonary embolus ([place]) 08/30/2016  . Dementia without behavioral disturbance ([place]) 06/01/2016  . Depression 01/20/2016  . Suicidal ideation 01/19/2016  . Prolapse of female pelvic organs 12/28/2015  . Leg weakness, bilateral 12/08/2015  . Weakness of right lower extremity 11/25/2015  . Dry eyes 10/28/2015  . (HFpEF) heart failure with preserved ejection fraction ([place])   . Gout 05/14/2015  . Delirium secondary to multiple medical problems 04/15/2015  . Primary hypertension   . Seasonal allergies 02/10/2015  . Stroke-like symptoms   . Stroke-like episode ([place]) 01/25/2015  . Syncope and collapse 01/25/2015  . Dizziness 09/19/2014  . Anemia of chronic  illness 08/19/2014  . Acute superficial venous thrombosis of right lower extremity 08/19/2014  . Hypercalcemia 06/09/2014  . Breast cancer, left breast ([place]) 06/09/2014  . Breast cancer, right breast ([place]) 06/09/2014  . Edema of left lower extremity 03/30/2014  . PAD (peripheral artery disease) ([place]) 03/30/2014  . History of colon cancer 12/11/2013  . History of fall 05/15/2013  . Stricture and stenosis of esophagus 10/27/2012  . Thoracic back pain 05/24/2012  . Productive cough 04/30/2012  . Tobacco use  disorder 06/02/2011  . Post-mastectomy pain 05/02/2011  . Hiatal hernia 03/22/2011  . Cough 01/31/2011  . 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 IDA (iron deficiency anemia) 04/29/2007  . Controlled type 2 diabetes mellitus with diabetic neuropathy, without long-term current use of insulin ([place]) 01/10/2007  . Coronary atherosclerosis 01/10/2007  . Asthma 01/10/2007  . Reflux esophagitis 01/10/2007  . Osteoarthritis, multiple sites 01/10/2007    Past Surgical History:  Procedure Laterality Date  . Breast masectomy  10/2010   Bilateral masectomy by Dr Lucia Gaskins s/p invasive ductal carcinoma.  Marland Kitchen BREAST SURGERY    . Cardiac  Catherization  2007   Severe 3-vessel disease.  EF 20-25%.  . ESOPHAGOGASTRODUODENOSCOPY  2001   Esophageal Tear  . ESOPHAGOGASTRODUODENOSCOPY  10/27/2012   Procedure: ESOPHAGOGASTRODUODENOSCOPY (EGD);  Surgeon: Irene Shipper, MD;  Location: [facility] ENDOSCOPY;  Service: Endoscopy;  Laterality: N/A;  pat  . JOINT REPLACEMENT Right 2001   Knee  . TOTAL KNEE ARTHROPLASTY  2001     OB History   No obstetric history on file.      Home Medications    Prior to Admission medications   Medication Sig Start Date End Date Taking? Authorizing Provider  acetaminophen (TYLENOL) 500 MG tablet Take 500 mg by mouth every 6 (six) hours as needed for moderate pain.    Yes [provider]  albuterol (VENTOLIN HFA) 108 (90 Base) MCG/ACT inhaler Inhale 2 puffs into the lungs every 6 (six) hours as needed. For shortness of breath 07/15/19  Yes Abraham, Sherin, DO  Amino Acids-Protein Hydrolys (FEEDING SUPPLEMENT, PRO-STAT SUGAR FREE 64,) LIQD Take 30 mLs by mouth 2 (two) times daily with a meal. ensure 01/25/19  Yes [name], MD  amLODipine (NORVASC) 5 MG tablet Take 1 tablet (5 mg total) by mouth daily. 02/14/19  Yes [place] Bing, DO  aspirin EC 325 MG tablet Take 1 tablet (325 mg total) by mouth daily. 07/05/18  Yes [place] Bing, DO  carvedilol (COREG) 12.5 MG  tablet Take 1 tablet (12.5 mg total) by mouth 2 (two) times daily with a meal. 02/06/19  Yes [place] Bing, DO  diclofenac sodium (VOLTAREN) 1 % GEL Apply 2 g topically 4 (four) times daily. 08/29/18  Yes Meccariello, Bernita Raisin, DO  donepezil (ARICEPT) 10 MG tablet Take 1 tablet (10 mg total) by mouth at bedtime. 07/05/19  Yes Mullis, Kiersten P, DO  fluticasone (FLONASE) 50 MCG/ACT nasal spray PLACE 1 SPRAY INTO BOTH NOSTRILS DAILY. 04/30/19 04/29/20 Yes [place] Bing, DO  HYDROcodone-acetaminophen (NORCO/VICODIN) 5-325 MG tablet Take 2 tablets by mouth every 6 (six) hours as needed for severe pain. 04/13/19  Yes Albrizze, Kaitlyn E, PA-C  loratadine (CLARITIN) 10 MG tablet Take 0.5 tablets (5 mg total) by mouth daily as needed for allergies or rhinitis. 06/17/19  Yes Mullis, Kiersten P, DO  losartan (COZAAR) 50 MG tablet Take 1 tablet (50 mg total) by mouth at bedtime. 07/05/19  Yes Mullis, Kiersten P, DO  pantoprazole (PROTONIX)  40 MG tablet Take 1 tablet (40 mg total) by mouth daily. 07/15/19  Yes Abraham, Sherin, DO  polyethylene glycol (MIRALAX / GLYCOLAX) packet Take 17 g by mouth daily. Patient taking differently: Take 17 g by mouth daily as needed for moderate constipation.  01/25/19  Yes [name], MD  potassium chloride SA (K-DUR) 20 MEQ tablet Take 1 tablet (20 mEq total) by mouth 2 (two) times daily for 7 days. 04/21/19 08/05/19 Yes Kinnie Feil, PA-C  ranitidine (ZANTAC) 150 MG tablet TAKE 1 TABLET BY MOUTH TWICE A DAY Patient taking differently: Take 150 mg by mouth 2 (two) times daily.  07/29/18  Yes [place] Bing, DO  rosuvastatin (CRESTOR) 20 MG tablet TAKE 1 TABLET BY MOUTH EVERYDAY AT BEDTIME Patient taking differently: Take 20 mg by mouth at bedtime.  09/09/18  Yes [place] Bing, DO  senna-docusate (SENOKOT-S) 8.6-50 MG tablet Take 2 tablets by mouth at bedtime. Patient taking differently: Take 1 tablet by mouth at bedtime as needed for mild constipation.  04/23/17  Yes  Virginia Crews, MD  traZODone (DESYREL) 100 MG tablet TAKE 1 TABLET (100 MG TOTAL) BY MOUTH AT BEDTIME AS NEEDED FOR SLEEP. 07/12/19  Yes Mullis, Kiersten P, DO  azithromycin (ZITHROMAX) 250 MG tablet 500 mg on day 1, followed by 250 mg once daily for 4 days Patient not taking: Reported on 08/05/2019 07/15/19   [name], DO  cephALEXin (KEFLEX) 500 MG capsule Take 1 capsule (500 mg total) by mouth 2 (two) times daily for 10 days. 08/05/19 08/15/19  Stefannie Defeo, Wenda Overland, MD  doxycycline (VIBRAMYCIN) 100 MG capsule Take 1 capsule (100 mg total) by mouth 2 (two) times daily. Patient not taking: Reported on 08/05/2019 05/02/19   Isla Pence, MD  cetirizine (ZYRTEC) 5 MG tablet Take 1 tablet (5 mg total) by mouth daily. Take at night time as it can cause some drowsiness 05/24/12 07/09/12  Losq, Burnell Blanks, MD    Family History Family History  Problem Relation Age of Onset  . Sickle cell anemia Other   . Heart disease Mother   . Heart disease Father   . Cancer Daughter 31       breast ca  . Hypertension Daughter   . Cancer Daughter 12       breast ca  . Hypertension Daughter   . Heart disease Son        Poor circulation-Left Leg  . Diabetes Daughter   . Hypertension Daughter   . Hyperlipidemia Daughter        Poor circulation- Toe amputation    Social History Social History   Tobacco Use  . Smoking status: Never Smoker  . Smokeless tobacco: Current User    Types: Snuff  Substance Use Topics  . Alcohol use: No  . Drug use: No     Allergies   Tape and Lisinopril   Review of Systems Review of Systems  Unable to perform ROS: Dementia  Musculoskeletal: Positive for back pain.     Physical Exam Updated Vital Signs BP 126/74   Pulse 75   Temp 98.7 F (37.1 C)   Resp 13   SpO2 97%   Physical Exam Vitals signs and nursing note reviewed.  Constitutional:      General: She is not in acute distress.    Appearance: She is well-developed.     Comments: Thin,  frail, alert  HENT:     Head: Normocephalic and atraumatic.  Eyes:  Conjunctiva/sclera: Conjunctivae normal.  Neck:     Musculoskeletal: Neck supple.  Cardiovascular:     Rate and Rhythm: Normal rate and regular rhythm.     Heart sounds: Normal heart sounds. No murmur.  Pulmonary:     Effort: Pulmonary effort is normal.     Breath sounds: Normal breath sounds.  Abdominal:     General: Bowel sounds are normal. There is no distension.     Palpations: Abdomen is soft.     Tenderness: There is no abdominal tenderness.  Musculoskeletal:     Comments: No focal areas of back tenderness, spinal step off, or skin changes  Skin:    General: Skin is warm and dry.  Neurological:     Mental Status: She is alert.     Comments: Fluent speech, oriented x 1, following commands      ED Treatments / Results  Labs (all labs ordered are listed, but only abnormal results are displayed) Labs Reviewed  URINALYSIS, ROUTINE W REFLEX MICROSCOPIC - Abnormal; Notable for the following components:      Result Value   APPearance TURBID (*)    Hgb urine dipstick SMALL (*)    Protein, ur 100 (*)    Leukocytes,Ua LARGE (*)    WBC, UA >50 (*)    Bacteria, UA MANY (*)    All other components within normal limits  URINE CULTURE    EKG None  Radiology No results found.  Procedures Procedures (including critical care time)  Medications Ordered in ED Medications  cephALEXin (KEFLEX) capsule 500 mg (has no administration in time range)     Initial Impression / Assessment and Plan / ED Course  I have reviewed the triage vital signs and the nursing notes.  Pertinent labs  that were available during my care of the patient were reviewed by me and considered in my medical decision making (see chart for details).        Alert, well appearing, normal VS. No abd tenderness.  UA does suggest infection.  She has previous culture data showing several antibiotic resistances.  Will treat with Keflex  which she appears susceptible to.  Will treat for 10 days given back pain as she may have early [place] although she appears well and is afebrile here.  I discussed treatment plan with both of patient's daughters over the phone including the daughter with whom she lives.  They voiced understanding of plan, follow-up, and return precautions.  Final Clinical Impressions(s) / ED Diagnoses   Final diagnoses:  Urinary tract infection without hematuria, site unspecified    ED Discharge Orders         Ordered    cephALEXin (KEFLEX) 500 MG capsule  2 times daily     08/05/19 1732           Adean Milosevic, Wenda Overland, MD 08/05/19 1740

## 2019-08-05 NOTE — ED Triage Notes (Signed)
Pt arrive via EMS from home lives with DTR. Pt is c/o lower  back pain x 2 days. Pt has hx of chronic back pain and when pt starts complaining of pain usually indicates  That she has a UTI, urine has been malodorous.      CBG 226, BP 174/74, RR 90, 98% RA

## 2019-08-05 NOTE — ED Notes (Signed)
Daughter called to pick up pt 

## 2019-08-07 LAB — URINE CULTURE: Culture: 80000 — AB

## 2019-08-08 ENCOUNTER — Telehealth: Payer: Self-pay | Admitting: Emergency Medicine

## 2019-08-08 NOTE — Telephone Encounter (Signed)
Post ED Visit - Positive Culture Follow-up  Culture report reviewed by antimicrobial stewardship pharmacist: [place] Team []  Elenor Quinones, Pharm.D. []  Heide Guile, Pharm.D., BCPS AQ-ID []  Parks Neptune, Pharm.D., BCPS []  Alycia Rossetti, Pharm.D., BCPS []  [place], [place].D., BCPS, AAHIVP []  Legrand Como, Pharm.D., BCPS, AAHIVP []  Salome Arnt, PharmD, BCPS []  Johnnette Gourd, PharmD, BCPS []  Hughes Better, PharmD, BCPS []  Leeroy Cha, PharmD []  Laqueta Linden, PharmD, BCPS []  Albertina Parr, PharmD  [place] Team []  Leodis Sias, PharmD []  Lindell Spar, PharmD []  Royetta Asal, PharmD []  Graylin Shiver, Rph []  Rema Fendt) Glennon Mac, PharmD []  Arlyn Dunning, PharmD []  Netta Cedars, PharmD [x]  Dia Sitter, PharmD []  Leone Haven, PharmD []  Gretta Arab, PharmD []  Theodis Shove, PharmD []  Peggyann Juba, PharmD []  Reuel Boom, PharmD   Positive urine culture Treated with Cephalexin, organism sensitive to the same and no further patient follow-up is required at this time.  Sandi Raveling Gussie Towson 08/08/2019, 10:36 AM

## 2019-08-10 ENCOUNTER — Encounter (HOSPITAL_COMMUNITY): Payer: Self-pay | Admitting: Emergency Medicine

## 2019-08-10 ENCOUNTER — Emergency Department (HOSPITAL_COMMUNITY): Payer: Medicare Other

## 2019-08-10 ENCOUNTER — Inpatient Hospital Stay (HOSPITAL_COMMUNITY)
Admission: EM | Admit: 2019-08-10 | Discharge: 2019-08-13 | DRG: 641 | Disposition: A | Discharge status: Skilled Nursing Facility | Payer: Medicare Other | Attending: Family Medicine | Admitting: Family Medicine

## 2019-08-10 ENCOUNTER — Other Ambulatory Visit: Payer: Self-pay

## 2019-08-10 DIAGNOSIS — N12 Tubulo-interstitial nephritis, not specified as acute or chronic: Secondary | ICD-10-CM | POA: Diagnosis not present

## 2019-08-10 DIAGNOSIS — Z96651 Presence of right artificial knee joint: Secondary | ICD-10-CM | POA: Diagnosis present

## 2019-08-10 DIAGNOSIS — K449 Diaphragmatic hernia without obstruction or gangrene: Secondary | ICD-10-CM | POA: Diagnosis present

## 2019-08-10 DIAGNOSIS — E1151 Type 2 diabetes mellitus with diabetic peripheral angiopathy without gangrene: Secondary | ICD-10-CM | POA: Diagnosis present

## 2019-08-10 DIAGNOSIS — R52 Pain, unspecified: Secondary | ICD-10-CM | POA: Diagnosis not present

## 2019-08-10 DIAGNOSIS — E46 Unspecified protein-calorie malnutrition: Secondary | ICD-10-CM | POA: Diagnosis not present

## 2019-08-10 DIAGNOSIS — M5030 Other cervical disc degeneration, unspecified cervical region: Secondary | ICD-10-CM | POA: Diagnosis present

## 2019-08-10 DIAGNOSIS — Z20828 Contact with and (suspected) exposure to other viral communicable diseases: Secondary | ICD-10-CM | POA: Diagnosis not present

## 2019-08-10 DIAGNOSIS — E785 Hyperlipidemia, unspecified: Secondary | ICD-10-CM | POA: Diagnosis present

## 2019-08-10 DIAGNOSIS — Z7982 Long term (current) use of aspirin: Secondary | ICD-10-CM

## 2019-08-10 DIAGNOSIS — D509 Iron deficiency anemia, unspecified: Secondary | ICD-10-CM | POA: Diagnosis present

## 2019-08-10 DIAGNOSIS — Z85038 Personal history of other malignant neoplasm of large intestine: Secondary | ICD-10-CM

## 2019-08-10 DIAGNOSIS — Z8673 Personal history of transient ischemic attack (TIA), and cerebral infarction without residual deficits: Secondary | ICD-10-CM

## 2019-08-10 DIAGNOSIS — I2510 Atherosclerotic heart disease of native coronary artery without angina pectoris: Secondary | ICD-10-CM | POA: Diagnosis present

## 2019-08-10 DIAGNOSIS — Z888 Allergy status to other drugs, medicaments and biological substances status: Secondary | ICD-10-CM

## 2019-08-10 DIAGNOSIS — K210 Gastro-esophageal reflux disease with esophagitis: Secondary | ICD-10-CM | POA: Diagnosis present

## 2019-08-10 DIAGNOSIS — D649 Anemia, unspecified: Secondary | ICD-10-CM

## 2019-08-10 DIAGNOSIS — Z853 Personal history of malignant neoplasm of breast: Secondary | ICD-10-CM

## 2019-08-10 DIAGNOSIS — Z79899 Other long term (current) drug therapy: Secondary | ICD-10-CM

## 2019-08-10 DIAGNOSIS — R531 Weakness: Secondary | ICD-10-CM | POA: Diagnosis not present

## 2019-08-10 DIAGNOSIS — E876 Hypokalemia: Secondary | ICD-10-CM | POA: Diagnosis not present

## 2019-08-10 DIAGNOSIS — I5032 Chronic diastolic (congestive) heart failure: Secondary | ICD-10-CM | POA: Diagnosis present

## 2019-08-10 DIAGNOSIS — K219 Gastro-esophageal reflux disease without esophagitis: Secondary | ICD-10-CM | POA: Diagnosis present

## 2019-08-10 DIAGNOSIS — Z86711 Personal history of pulmonary embolism: Secondary | ICD-10-CM

## 2019-08-10 DIAGNOSIS — Z91048 Other nonmedicinal substance allergy status: Secondary | ICD-10-CM

## 2019-08-10 DIAGNOSIS — G4700 Insomnia, unspecified: Secondary | ICD-10-CM | POA: Diagnosis present

## 2019-08-10 DIAGNOSIS — Z8249 Family history of ischemic heart disease and other diseases of the circulatory system: Secondary | ICD-10-CM

## 2019-08-10 DIAGNOSIS — Z833 Family history of diabetes mellitus: Secondary | ICD-10-CM

## 2019-08-10 DIAGNOSIS — F17290 Nicotine dependence, other tobacco product, uncomplicated: Secondary | ICD-10-CM | POA: Diagnosis present

## 2019-08-10 DIAGNOSIS — Z66 Do not resuscitate: Secondary | ICD-10-CM | POA: Diagnosis not present

## 2019-08-10 DIAGNOSIS — M1990 Unspecified osteoarthritis, unspecified site: Secondary | ICD-10-CM | POA: Diagnosis present

## 2019-08-10 DIAGNOSIS — F0390 Unspecified dementia without behavioral disturbance: Secondary | ICD-10-CM | POA: Diagnosis present

## 2019-08-10 DIAGNOSIS — Z86718 Personal history of other venous thrombosis and embolism: Secondary | ICD-10-CM

## 2019-08-10 DIAGNOSIS — Z803 Family history of malignant neoplasm of breast: Secondary | ICD-10-CM

## 2019-08-10 DIAGNOSIS — Z9013 Acquired absence of bilateral breasts and nipples: Secondary | ICD-10-CM

## 2019-08-10 DIAGNOSIS — B961 Klebsiella pneumoniae [K. pneumoniae] as the cause of diseases classified elsewhere: Secondary | ICD-10-CM | POA: Diagnosis present

## 2019-08-10 DIAGNOSIS — I252 Old myocardial infarction: Secondary | ICD-10-CM

## 2019-08-10 DIAGNOSIS — M109 Gout, unspecified: Secondary | ICD-10-CM | POA: Diagnosis present

## 2019-08-10 DIAGNOSIS — R112 Nausea with vomiting, unspecified: Secondary | ICD-10-CM | POA: Diagnosis not present

## 2019-08-10 DIAGNOSIS — I110 Hypertensive heart disease with heart failure: Secondary | ICD-10-CM | POA: Diagnosis present

## 2019-08-10 LAB — COMPREHENSIVE METABOLIC PANEL
ALT: 7 U/L (ref 0–44)
AST: 17 U/L (ref 15–41)
Albumin: 3.1 g/dL — ABNORMAL LOW (ref 3.5–5.0)
Alkaline Phosphatase: 60 U/L (ref 38–126)
Anion gap: 11 (ref 5–15)
BUN: 9 mg/dL (ref 8–23)
CO2: 27 mmol/L (ref 22–32)
Calcium: 10.3 mg/dL (ref 8.9–10.3)
Chloride: 103 mmol/L (ref 98–111)
Creatinine, Ser: 0.78 mg/dL (ref 0.44–1.00)
GFR calc Af Amer: >60 mL/min (ref >60)
GFR calc non Af Amer: >60 mL/min (ref >60)
Glucose, Bld: 115 mg/dL — ABNORMAL HIGH (ref 70–99)
Potassium: 2.5 mmol/L — CL (ref 3.5–5.1)
Sodium: 141 mmol/L (ref 135–145)
Total Bilirubin: 0.4 mg/dL (ref 0.3–1.2)
Total Protein: 6.8 g/dL (ref 6.5–8.1)

## 2019-08-10 LAB — CK: Total CK: 82 U/L (ref 38–234)

## 2019-08-10 LAB — URINALYSIS, ROUTINE W REFLEX MICROSCOPIC
Bacteria, UA: NONE SEEN
Bilirubin Urine: NEGATIVE
Glucose, UA: NEGATIVE mg/dL
Hgb urine dipstick: NEGATIVE
Ketones, ur: 5 mg/dL — AB
Leukocytes,Ua: NEGATIVE
Nitrite: NEGATIVE
Protein, ur: 30 mg/dL — AB
Specific Gravity, Urine: 1.01 (ref 1.005–1.030)
pH: 7 (ref 5.0–8.0)

## 2019-08-10 LAB — CBC WITH DIFFERENTIAL/PLATELET
Abs Immature Granulocytes: 0.01 10*3/uL (ref 0.00–0.07)
Basophils Absolute: 0 10*3/uL (ref 0.0–0.1)
Basophils Relative: 1 %
Eosinophils Absolute: 0.1 10*3/uL (ref 0.0–0.5)
Eosinophils Relative: 1 %
HCT: 31 % — ABNORMAL LOW (ref 36.0–46.0)
Hemoglobin: 9.7 g/dL — ABNORMAL LOW (ref 12.0–15.0)
Immature Granulocytes: 0 %
Lymphocytes Relative: 32 %
Lymphs Abs: 1.4 10*3/uL (ref 0.7–4.0)
MCH: 22.4 pg — ABNORMAL LOW (ref 26.0–34.0)
MCHC: 31.3 g/dL (ref 30.0–36.0)
MCV: 71.6 fL — ABNORMAL LOW (ref 80.0–100.0)
Monocytes Absolute: 0.3 10*3/uL (ref 0.1–1.0)
Monocytes Relative: 8 %
Neutro Abs: 2.5 10*3/uL (ref 1.7–7.7)
Neutrophils Relative %: 58 %
Platelets: 203 10*3/uL (ref 150–400)
RBC: 4.33 MIL/uL (ref 3.87–5.11)
RDW: 15.8 % — ABNORMAL HIGH (ref 11.5–15.5)
WBC: 4.3 10*3/uL (ref 4.0–10.5)
nRBC: 0 % (ref 0.0–0.2)

## 2019-08-10 LAB — LIPASE, BLOOD: Lipase: 33 U/L (ref 11–51)

## 2019-08-10 LAB — POC OCCULT BLOOD, ED: Fecal Occult Bld: NEGATIVE

## 2019-08-10 MED ORDER — POTASSIUM CHLORIDE CRYS ER 20 MEQ PO TBCR
40.0000 meq | EXTENDED_RELEASE_TABLET | Freq: Once | ORAL | Status: AC
  Administered 2019-08-10: 40 meq via ORAL
  Filled 2019-08-10: qty 2

## 2019-08-10 MED ORDER — FAMOTIDINE IN NACL 20-0.9 MG/50ML-% IV SOLN: 20.0000 mg | INTRAVENOUS | Status: DC

## 2019-08-10 MED ORDER — ONDANSETRON HCL 4 MG/2ML IJ SOLN
4.0000 mg | Freq: Once | INTRAMUSCULAR | Status: AC
  Administered 2019-08-10: 4 mg via INTRAVENOUS
  Filled 2019-08-10: qty 2

## 2019-08-10 MED ORDER — ENOXAPARIN SODIUM 40 MG/0.4ML ~~LOC~~ SOLN
40.0000 mg | SUBCUTANEOUS | Status: DC
  Administered 2019-08-11 – 2019-08-13 (×3): 40 mg via SUBCUTANEOUS
  Filled 2019-08-10 (×3): qty 0.4

## 2019-08-10 MED ORDER — TRAZODONE HCL 50 MG PO TABS
100.0000 mg | ORAL_TABLET | Freq: Every day | ORAL | Status: DC
  Administered 2019-08-11 – 2019-08-12 (×3): 100 mg via ORAL
  Filled 2019-08-10 (×3): qty 2

## 2019-08-10 MED ORDER — CEPHALEXIN 500 MG PO CAPS
500.0000 mg | ORAL_CAPSULE | Freq: Two times a day (BID) | ORAL | Status: DC
  Administered 2019-08-11 – 2019-08-13 (×6): 500 mg via ORAL
  Filled 2019-08-10 (×6): qty 1

## 2019-08-10 MED ORDER — AMLODIPINE BESYLATE 5 MG PO TABS
5.0000 mg | ORAL_TABLET | Freq: Every day | ORAL | Status: DC
  Administered 2019-08-11 – 2019-08-13 (×3): 5 mg via ORAL
  Filled 2019-08-10 (×3): qty 1

## 2019-08-10 MED ORDER — DICLOFENAC SODIUM 1 % TD GEL
2.0000 g | Freq: Four times a day (QID) | TRANSDERMAL | Status: DC | PRN
  Filled 2019-08-10: qty 100

## 2019-08-10 MED ORDER — ACETAMINOPHEN 650 MG RE SUPP: 650.0000 mg | Freq: Four times a day (QID) | RECTAL | Status: DC | PRN

## 2019-08-10 MED ORDER — LORATADINE 10 MG PO TABS: 5.0000 mg | ORAL_TABLET | Freq: Every day | ORAL | Status: DC | PRN

## 2019-08-10 MED ORDER — ACETAMINOPHEN 325 MG PO TABS
650.0000 mg | ORAL_TABLET | Freq: Four times a day (QID) | ORAL | Status: DC | PRN
  Administered 2019-08-11: 650 mg via ORAL
  Filled 2019-08-10: qty 2

## 2019-08-10 MED ORDER — LOSARTAN POTASSIUM 50 MG PO TABS
50.0000 mg | ORAL_TABLET | Freq: Every day | ORAL | Status: DC
  Administered 2019-08-11 – 2019-08-12 (×3): 50 mg via ORAL
  Filled 2019-08-10 (×3): qty 1

## 2019-08-10 MED ORDER — LIDOCAINE HCL (PF) 1 % IJ SOLN
INTRAMUSCULAR | Status: AC
  Filled 2019-08-10: qty 5

## 2019-08-10 MED ORDER — ALBUTEROL SULFATE (2.5 MG/3ML) 0.083% IN NEBU: 3.0000 mL | INHALATION_SOLUTION | Freq: Four times a day (QID) | RESPIRATORY_TRACT | Status: DC | PRN

## 2019-08-10 MED ORDER — DONEPEZIL HCL 10 MG PO TABS
10.0000 mg | ORAL_TABLET | Freq: Every day | ORAL | Status: DC
  Administered 2019-08-11 – 2019-08-12 (×3): 10 mg via ORAL
  Filled 2019-08-10 (×3): qty 1

## 2019-08-10 MED ORDER — ROSUVASTATIN CALCIUM 20 MG PO TABS
20.0000 mg | ORAL_TABLET | Freq: Every day | ORAL | Status: DC
  Administered 2019-08-11 – 2019-08-12 (×2): 20 mg via ORAL
  Filled 2019-08-10 (×2): qty 1

## 2019-08-10 MED ORDER — FLUTICASONE PROPIONATE 50 MCG/ACT NA SUSP
1.0000 | Freq: Every day | NASAL | Status: DC | PRN
  Filled 2019-08-10: qty 16

## 2019-08-10 MED ORDER — SODIUM CHLORIDE 0.9 % IV SOLN: INTRAVENOUS | Status: DC

## 2019-08-10 MED ORDER — SODIUM CHLORIDE 0.9 % IV BOLUS
500.0000 mL | Freq: Once | INTRAVENOUS | Status: AC
  Administered 2019-08-10: 16:00:00 500 mL via INTRAVENOUS

## 2019-08-10 MED ORDER — POTASSIUM CHLORIDE 10 MEQ/100ML IV SOLN
10.0000 meq | INTRAVENOUS | Status: AC
  Administered 2019-08-10 (×2): 10 meq via INTRAVENOUS
  Filled 2019-08-10 (×2): qty 100

## 2019-08-10 MED ORDER — POLYETHYLENE GLYCOL 3350 17 G PO PACK
17.0000 g | PACK | Freq: Every day | ORAL | Status: DC
  Administered 2019-08-12 – 2019-08-13 (×2): 17 g via ORAL
  Filled 2019-08-10 (×3): qty 1

## 2019-08-10 MED ORDER — FAMOTIDINE 20 MG PO TABS
20.0000 mg | ORAL_TABLET | Freq: Every day | ORAL | Status: DC
  Administered 2019-08-11 – 2019-08-13 (×3): 20 mg via ORAL
  Filled 2019-08-10 (×4): qty 1

## 2019-08-10 NOTE — H&P (Addendum)
[facility] Admission History and Physical Service Pager: [phone]  [name] Medical record number: [id number] Date of birth: 02-Jan-1924 Age: 85 y.o. Gender: female  Primary Care Provider: Danna Hefty, DO Consultants: None Code Status:   Code Status: DNR  Preferred Emergency Contact:   Chief Complaint: Generalized weakness and pain, UTI   Assessment and Plan: [name] is a 85 y.o. female presenting with generalized weakness and non-focal pain. PMH is significant for hypokalemia, dementia, IDA, HFpEF, HTN, T2DM, GERD, Malnutrition    Weakness and Pain 2/2 to ?Acute on Chronic Hypokalemia  Patient with generalized weakness and pain and an episode of emesis.  Denies focal pain. No chest pain or palpitations. Patient bradycardic HR 50 and hypertensive systolic [id number]. Labs remarkable for K 2.5. In ED given 40 mEQ K-Dur and 10 mEq IV K x2.  Patient is prescribed K-Dur 50mEQ BID. Unsure if patient takes K regularly. Obtain EKG. Etiology of hypokalemia unclear. Patient endorses recent history of decreased PO from stomach upset and vomiting, but no complaints of pain or nausea on admission. Patient with hx of HFpEF and could be on loop diuretic that is not listed on her home medication list. Check magnesium in AM. Patient has a history of dementia and appears to be a poor historian. A more thorough history regarding medications and oral intake may be helpful in the AM if the pt's daughter is available. Consider workup for hyperaldosteronism.  - Admit to med-surg, Attending Dr. Ardelia Mems  - Replete potassium as needed  - Contact pt's daughter for additional history  - AM EKG  - AM BMP - Follow up Mg  - PT / OT Evaluation  - SLP : [facility] - Vitals per Routine    UTI  Patient diagnosed with UTI in ED on 08/05/19 after patient reported worsening low back pain. UA today without evidence of UTI and patient denies current dysuria.  Patient  prescribed 10 day course of Keflex for concern for pyelonephritis.  - Continue Keflex  - Follow up with family regarding days of treatment completed   Anemia, iron deficiency, chronic-stable Hemoglobin of around 10 at baseline. Microcytic anemia, on admission Hgb 9.7 with MCV 71.6. Labs on 05/2018 consistent with iron deficiency anemia. -Continue to monitor -AM CBC   Protein Calorie Malnutrition  Albumin on admission 3.1.  She takes protein supplement shakes at home. -Continue Ensure between meals    HFpEF, chronic-stable Last ECHO done on 06/04/18  showed an EF of 65% with grade 1 diastolic dysfunction. No concern for acute decompensated heart failure at this time.  Home medications include losartan 50 mg daily and carvedilol 12.5 mg twice daily. -Continue losartan 50 mg daily -Continue carvedilol 12.5 mg twice daily   HTN   Patient hypertensive on admission with blood pressure 184/74. Home medications include amlodipine 5 mg daily, Losartan 50 mg daily and Coreg 12.5 twice daily. - Continue amlodipine and Losartan  - Hold Coreg as patient's bradycardia  - Monitor blood pressures    T2DM Well controlled without medications. Per chart review, last A1c was 6.7 on 06/04/18.  Blood glucose on admission was in the 115.   -Monitor glu with daily BMP   HLD At home on rosuvastatin 20 mg daily. Last lipid panel done 06/03/2018 shows total cholesterol of 78, HDL 28 and LDL 32.  In a 85 year old with a code status of DNR statin may not be helpful for medical goals. Consider discontinuation of statin with  PCP. -Continue home med   GERD At home on Protonix 40 mg daily and Zantac 150 mg.  -Pepcid 20 mg and Protonix 40 mg   Insomnia  Home regimen includes Trazodone 100 mg qhs -Continue home meds  Dementia  [facility] exam in March 2020 with a score of 5/19 (primarily due to inability to read and write). Home medications include Aricept 10 mg qHS. Patient completed dependant on daughter and hired  help for ADLs/IADLS.Patient endorsing that her daughter is "mean" to her. But denies any abuse, neglect, or theft. Patient states that she wants to life at a "home"  (nursing home).   -Continue home med - could benefit from outpatient geriatric follow up - PT/OT  - Pending PT/OT, consider SW for possible SNF    Nicotine use d/o She reports a long history of dipping snuff.  She is not interested in a nicotine patch at this time. -Continue to monitor   FEN/GI: Protonix and Pepcid, Miralax, Replete electrolytes as needed  Prophylaxis: Lovenox   Disposition: pending medical clearance  History of Present Illness:  [name] is a 85 y.o. female presenting with generalized pain and weakness that began a few days ago.  Has not had much of an appetite since eating collard greens the other day. She states she hasn't been right since. Reports she vomited once and has had nausea. Denies any focal pain and ongoing vomiting or diarrhea. Patient was recently treated for a urinary tract infection. Denies current dysuria. Reports fell about a month ago in the bathroom and may have hit her head however there was no LOC.    Review Of Systems: Per HPI with the following additions:   Review of Systems  Constitutional: Negative for fever.  HENT: Negative for sore throat.   Respiratory: Negative for cough and shortness of breath.   Cardiovascular: Negative for chest pain and leg swelling.  Gastrointestinal: Positive for nausea and vomiting. Negative for abdominal pain and diarrhea.  Genitourinary: Negative for dysuria.  Musculoskeletal: Positive for myalgias.  Neurological: Positive for weakness. Negative for dizziness, focal weakness, loss of consciousness and headaches.    Patient Active Problem List   Diagnosis Date Noted  . Constipation 01/29/2019  . Chronic breast pain 12/26/2018  . Pain in gums 12/26/2018  . History of deep vein thrombosis 10/17/2018  . Severe protein-calorie malnutrition  [name]: less than 60% of standard weight) ([place]) 10/17/2018  . Disorder associated with well-controlled type 2 diabetes mellitus ([place]) 10/17/2018  . History of pulmonary embolus (PE) 10/17/2018  . Hypomagnesemia 08/27/2018  . Left ventricular hypertrophy by electrocardiogram 08/27/2018  . Atypical chest pain 08/26/2018  . Advanced nonexudative age-related macular degeneration of both eyes without subfoveal involvement 07/30/2018  . Altered mental status 06/06/2018  . Microcytic anemia   . TIA (transient ischemic attack) 06/03/2018  . Mobility impaired 04/26/2018  . Protein-calorie malnutrition, severe 02/06/2017  . Urinary tract infection without hematuria 02/03/2017  . Hypokalemia 10/13/2016  . Pressure injury of heel, stage 2 ([place]) 08/31/2016  . Pulmonary embolus ([place]) 08/30/2016  . Dementia without behavioral disturbance ([place]) 06/01/2016  . Depression 01/20/2016  . Suicidal ideation 01/19/2016  . Prolapse of female pelvic organs 12/28/2015  . Leg weakness, bilateral 12/08/2015  . Weakness of right lower extremity 11/25/2015  . Dry eyes 10/28/2015  . (HFpEF) heart failure with preserved ejection fraction ([place])   . Gout 05/14/2015  . Delirium secondary to multiple medical problems 04/15/2015  . Primary hypertension   . Seasonal allergies  02/10/2015  . Stroke-like symptoms   . Stroke-like episode ([place]) 01/25/2015  . Syncope and collapse 01/25/2015  . Dizziness 09/19/2014  . Anemia of chronic illness 08/19/2014  . Acute superficial venous thrombosis of right lower extremity 08/19/2014  . Hypercalcemia 06/09/2014  . Breast cancer, left breast ([place]) 06/09/2014  . Breast cancer, right breast ([place]) 06/09/2014  . Edema of left lower extremity 03/30/2014  . PAD (peripheral artery disease) ([place]) 03/30/2014  . History of colon cancer 12/11/2013  . History of fall 05/15/2013  . Stricture and stenosis of esophagus 10/27/2012  . Thoracic back pain 05/24/2012  . Productive cough  04/30/2012  . Tobacco use disorder 06/02/2011  . Post-mastectomy pain 05/02/2011  . Hiatal hernia 03/22/2011  . Cough 01/31/2011  . 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 IDA (iron deficiency anemia) 04/29/2007  . Controlled type 2 diabetes mellitus with diabetic neuropathy, without long-term current use of insulin ([place]) 01/10/2007  . Coronary atherosclerosis 01/10/2007  . Asthma 01/10/2007  . Reflux esophagitis 01/10/2007  . Osteoarthritis, multiple sites 01/10/2007    Past Medical History: Past Medical History:  Diagnosis Date  . Allergy   . Anemia    Iron def anemia  . Arthritis   . Asthma   . Breast cancer ([place]) 10/2010   Invasive Ductal Carcinoma, s/p bilateral mastectomy, now on Tamoxifen. Followed by Dr Jamse Arn.   . Breast cancer ([place]) 2011   s/p Bilateral masectomy  . CAD (coronary artery disease)   . Carotid artery aneurysm ([place]) 08/2010   right ICA, 5 x 88mm  . CHF (congestive heart failure) ([facility])    Systolic   . Colon cancer ([address])   . Colon cancer ([place]) 12/11/2013  . DDD (degenerative disc disease), cervical   . Diabetes mellitus   . Diverticulosis   . GERD (gastroesophageal reflux disease)   . H/O: GI bleed   . Hiatal hernia   . History of colon cancer   . Hypercalcemia 06/09/2014  . Hyperlipidemia   . Hypertension   . Myocardial infarction ([place])   . Neuropathy   . Pneumonia 03/2012  . Pressure injury of heel, stage 2 ([place]) 08/31/2016  . Shortness of breath     Past Surgical History: Past Surgical History:  Procedure Laterality Date  . Breast masectomy  10/2010   Bilateral masectomy by Dr Lucia Gaskins s/p invasive ductal carcinoma.  Marland Kitchen BREAST SURGERY    . Cardiac  Catherization  2007   Severe 3-vessel disease.  EF 20-25%.  . ESOPHAGOGASTRODUODENOSCOPY  2001   Esophageal Tear  . ESOPHAGOGASTRODUODENOSCOPY  10/27/2012   Procedure: ESOPHAGOGASTRODUODENOSCOPY (EGD);  Surgeon: Irene Shipper, MD;  Location: [facility] ENDOSCOPY;  Service: Endoscopy;  Laterality:  N/A;  pat  . JOINT REPLACEMENT Right 2001   Knee  . TOTAL KNEE ARTHROPLASTY  2001    Social History: Social History   Tobacco Use  . Smoking status: Never Smoker  . Smokeless tobacco: Current User    Types: Snuff  Substance Use Topics  . Alcohol use: No  . Drug use: No   Additional social history:  Please also refer to relevant sections of EMR.  Family History: Family History  Problem Relation Age of Onset  . Sickle cell anemia Other   . Heart disease Mother   . Heart disease Father   . Cancer Daughter 49       breast ca  . Hypertension Daughter   . Cancer Daughter 52       breast ca  . Hypertension  Daughter   . Heart disease Son        Poor circulation-Left Leg  . Diabetes Daughter   . Hypertension Daughter   . Hyperlipidemia Daughter        Poor circulation- Toe amputation    Allergies and Medications: Allergies  Allergen Reactions  . Tape Other (See Comments)    Skin is very thin and will tear and bruise easily!!   . Lisinopril Cough   No current facility-administered medications on file prior to encounter.    Current Outpatient Medications on File Prior to Encounter  Medication Sig Dispense Refill  . acetaminophen (TYLENOL) 500 MG tablet Take 500 mg by mouth every 6 (six) hours as needed for moderate pain.     Marland Kitchen albuterol (VENTOLIN HFA) 108 (90 Base) MCG/ACT inhaler Inhale 2 puffs into the lungs every 6 (six) hours as needed. For shortness of breath 18 g 2  . amLODipine (NORVASC) 5 MG tablet Take 1 tablet (5 mg total) by mouth daily. 90 tablet 3  . aspirin EC 325 MG tablet Take 1 tablet (325 mg total) by mouth daily. 90 tablet 3  . carvedilol (COREG) 12.5 MG tablet Take 1 tablet (12.5 mg total) by mouth 2 (two) times daily with a meal. 180 tablet 3  . cephALEXin (KEFLEX) 500 MG capsule Take 1 capsule (500 mg total) by mouth 2 (two) times daily for 10 days. 20 capsule 0  . donepezil (ARICEPT) 10 MG tablet Take 1 tablet (10 mg total) by mouth at bedtime. 90  tablet 0  . fluticasone (FLONASE) 50 MCG/ACT nasal spray PLACE 1 SPRAY INTO BOTH NOSTRILS DAILY. (Patient taking differently: Place 1 spray into both nostrils daily as needed for allergies or rhinitis. ) 16 g 0  . loratadine (CLARITIN) 10 MG tablet Take 0.5 tablets (5 mg total) by mouth daily as needed for allergies or rhinitis. 60 tablet 2  . losartan (COZAAR) 50 MG tablet Take 1 tablet (50 mg total) by mouth at bedtime. 90 tablet 3  . pantoprazole (PROTONIX) 40 MG tablet Take 1 tablet (40 mg total) by mouth daily. 90 tablet 1  . rosuvastatin (CRESTOR) 20 MG tablet TAKE 1 TABLET BY MOUTH EVERYDAY AT BEDTIME (Patient taking differently: Take 20 mg by mouth at bedtime. ) 90 tablet 2  . senna-docusate (SENOKOT-S) 8.6-50 MG tablet Take 2 tablets by mouth at bedtime. (Patient taking differently: Take 1 tablet by mouth 2 (two) times daily as needed for mild constipation. ) 60 tablet 3  . traZODone (DESYREL) 100 MG tablet TAKE 1 TABLET (100 MG TOTAL) BY MOUTH AT BEDTIME AS NEEDED FOR SLEEP. (Patient taking differently: Take 100 mg by mouth at bedtime. ) 90 tablet 1  . Amino Acids-Protein Hydrolys (FEEDING SUPPLEMENT, PRO-STAT SUGAR FREE 64,) LIQD Take 30 mLs by mouth 2 (two) times daily with a meal. ensure (Patient not taking: Reported on 08/10/2019) 887 mL 0  . diclofenac sodium (VOLTAREN) 1 % GEL Apply 2 g topically 4 (four) times daily. (Patient not taking: Reported on 08/10/2019) 1 Tube 0  . HYDROcodone-acetaminophen (NORCO/VICODIN) 5-325 MG tablet Take 2 tablets by mouth every 6 (six) hours as needed for severe pain. (Patient not taking: Reported on 08/10/2019) 8 tablet 0  . polyethylene glycol (MIRALAX / GLYCOLAX) packet Take 17 g by mouth daily. (Patient not taking: Reported on 08/10/2019) 14 each 0  . potassium chloride SA (K-DUR) 20 MEQ tablet Take 1 tablet (20 mEq total) by mouth 2 (two) times daily for 7 days. (Patient  not taking: Reported on 08/10/2019) 14 tablet 0  . ranitidine (ZANTAC) 150 MG tablet  TAKE 1 TABLET BY MOUTH TWICE A DAY (Patient not taking: No sig reported) 180 tablet 1  . [DISCONTINUED] cetirizine (ZYRTEC) 5 MG tablet Take 1 tablet (5 mg total) by mouth daily. Take at night time as it can cause some drowsiness 30 tablet 4    Objective: BP (!) 184/74   Pulse (!) 50   Temp 98.2 F (36.8 C) (Oral)   Resp 13   SpO2 100%  Exam:  GEN:     alert, pleasant elderly female in no acute distress    HENT:  mucus membranes moist, oropharyngeal without lesions or erythema,  nares patent, no nasal discharge  EYES:   pupils equal and reactive, EOM intact NECK:  supple, normal ROM, no lymphadenopathy  RESP:  clear to auscultation bilaterally, no increased work of breathing  CVS:   regular rate and rhythm, no murmur, distal pulses intact  ABD:  soft, non-tender; bowel sounds present; no palpable masses   EXT:   atraumatic, no edema  NEURO:  CN 2-12 grossly intact, speech normal, alert and oriented to person and place only   Skin:   warm and dry, no rash, normal skin turgor Psych: Normal affect and thought content    Labs and Imaging: CBC BMET  Recent Labs  Lab 08/10/19 1722  WBC 4.3  HGB 9.7*  HCT 31.0*  PLT 203   Recent Labs  Lab 08/10/19 1722  NA 141  K 2.5*  CL 103  CO2 27  BUN 9  CREATININE 0.78  GLUCOSE 115*  CALCIUM 10.3       Dg Abd Acute W/chest  Result Date: 08/10/2019 CLINICAL DATA:  Nausea and vomiting EXAM: DG ABDOMEN ACUTE W/ 1V CHEST COMPARISON:  None. FINDINGS: Normal mediastinum and cardiac silhouette. Normal pulmonary vasculature. No evidence of effusion, infiltrate, or pneumothorax. No acute bony abnormality. No dilated large or small bowel. No intraperitoneal free air. Gas in the rectum. Surgical clips in the lower pelvis. Degenerate changes of the spine.  Osteoarthritis of the hips. IMPRESSION: 1.  No acute cardiopulmonary process. 2. No bowel obstruction. Electronically Signed   By: Suzy Bouchard M.D.   On: 08/10/2019 14:54     Lyndee Hensen, MD 08/11/2019, 1:17 AM PGY-1, [place] Intern pager: [phone], text pages welcome  [place]   I have seen and examined this patient.    I have discussed the findings and exam with the intern and agree with the above note, which I have edited appropriately in [place]. I helped develop the management plan that is described in the resident's note, and I agree with the content.   Marny Lowenstein, MD, MS FAMILY MEDICINE RESIDENT - PGY3 08/11/2019 2:41 AM

## 2019-08-10 NOTE — ED Triage Notes (Signed)
Pt arrive [facility] from home with daughter with co increased generalized pain. Hx of chronic back pain and dementia. Pt hasnt slept well in 3 days. Was diagnosed with UTI on Wednesday of last week but hasnt been taking medication correctly. Decreased appetite as well.

## 2019-08-10 NOTE — ED Notes (Addendum)
Date and time results received: 08/10/19 1844 (use smartphrase ".now" to insert current time)  Test: K+ Critical Value: 2.5  Name of Provider Notified: knapp  Orders Received? Or Actions Taken?: .

## 2019-08-10 NOTE — ED Provider Notes (Signed)
[place] EMERGENCY DEPARTMENT Provider Note   CSN: [id number] Arrival date & time: 08/10/19  1242     History   Chief Complaint Chief Complaint  Patient presents with  . increased generalized pain  . Urinary Tract Infection    HPI [name] is a 85 y.o. female.     HPI Patient presents to the emergency room for evaluation of diffuse myalgias and vomiting.  Patient states her symptoms started a couple of days ago after she had a meal with collard greens.  Patient thinks that might have set her off.  Since then she has had issues with nausea and vomiting.  She has not been able to eat properly.  Patient also has been having pain throughout her body.  Patient states she aches although it does not hurt to touch anywhere in particular.  She is not having trouble with chest pain or shortness of breath.  She denies having any abdominal pain.  She denies any diarrhea.  Patient was recently in the hospital for urinary tract infection.  Patient states she is not having any particular discomfort with that right now. Past Medical History:  Diagnosis Date  . Allergy   . Anemia    Iron def anemia  . Arthritis   . Asthma   . Breast cancer ([place]) 10/2010   Invasive Ductal Carcinoma, s/p bilateral mastectomy, now on Tamoxifen. Followed by [name].   . Breast cancer ([place]) 2011   s/p Bilateral masectomy  . CAD (coronary artery disease)   . Carotid artery aneurysm ([place]) 08/2010   right ICA, 5 x 64mm  . CHF (congestive heart failure) (HCC)    Systolic   . Colon cancer ([place])   . Colon cancer ([place]) 12/11/2013  . DDD (degenerative disc disease), cervical   . Diabetes mellitus   . Diverticulosis   . GERD (gastroesophageal reflux disease)   . H/O: GI bleed   . Hiatal hernia   . History of colon cancer   . Hypercalcemia 06/09/2014  . Hyperlipidemia   . Hypertension   . Myocardial infarction ([place])   . Neuropathy   . Pneumonia 03/2012  . Pressure injury of heel, stage 2  ([place]) 08/31/2016  . Shortness of breath     Patient Active Problem List   Diagnosis Date Noted  . Constipation 01/29/2019  . Chronic breast pain 12/26/2018  . Pain in gums 12/26/2018  . History of deep vein thrombosis 10/17/2018  . Severe protein-calorie malnutrition Altamease [place]iler: less than 60% of standard weight) ([name]) 10/17/2018  . Disorder associated with well-controlled type 2 diabetes mellitus ([place]) 10/17/2018  . History of pulmonary embolus (PE) 10/17/2018  . Hypomagnesemia 08/27/2018  . Left ventricular hypertrophy by electrocardiogram 08/27/2018  . Atypical chest pain 08/26/2018  . Advanced nonexudative age-related macular degeneration of both eyes without subfoveal involvement 07/30/2018  . Altered mental status 06/06/2018  . Microcytic anemia   . TIA (transient ischemic attack) 06/03/2018  . Mobility impaired 04/26/2018  . Protein-calorie malnutrition, severe 02/06/2017  . Urinary tract infection without hematuria 02/03/2017  . Hypokalemia 10/13/2016  . Pressure injury of heel, stage 2 ([place]) 08/31/2016  . Pulmonary embolus ([place]) 08/30/2016  . Dementia without behavioral disturbance ([place]) 06/01/2016  . Depression 01/20/2016  . Suicidal ideation 01/19/2016  . Prolapse of female pelvic organs 12/28/2015  . Leg weakness, bilateral 12/08/2015  . Weakness of right lower extremity 11/25/2015  . Dry eyes 10/28/2015  . (HFpEF) heart failure with preserved ejection fraction ([place])   .  Gout 05/14/2015  . Delirium secondary to multiple medical problems 04/15/2015  . Primary hypertension   . Seasonal allergies 02/10/2015  . Stroke-like symptoms   . Stroke-like episode ([place]) 01/25/2015  . Syncope and collapse 01/25/2015  . Dizziness 09/19/2014  . Anemia of chronic illness 08/19/2014  . Acute superficial venous thrombosis of right lower extremity 08/19/2014  . Hypercalcemia 06/09/2014  . Breast cancer, left breast ([place]) 06/09/2014  . Breast cancer, right breast ([place]) 06/09/2014  .  Edema of left lower extremity 03/30/2014  . PAD (peripheral artery disease) ([place]) 03/30/2014  . History of colon cancer 12/11/2013  . History of fall 05/15/2013  . Stricture and stenosis of esophagus 10/27/2012  . Thoracic back pain 05/24/2012  . Productive cough 04/30/2012  . Tobacco use disorder 06/02/2011  . Post-mastectomy pain 05/02/2011  . Hiatal hernia 03/22/2011  . Cough 01/31/2011  . 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 IDA (iron deficiency anemia) 04/29/2007  . Controlled type 2 diabetes mellitus with diabetic neuropathy, without long-term current use of insulin ([place]) 01/10/2007  . Coronary atherosclerosis 01/10/2007  . Asthma 01/10/2007  . Reflux esophagitis 01/10/2007  . Osteoarthritis, multiple sites 01/10/2007    Past Surgical History:  Procedure Laterality Date  . Breast masectomy  10/2010   Bilateral masectomy by [name] Lucia Gaskins s/p invasive ductal carcinoma.  Marland Kitchen BREAST SURGERY    . Cardiac  Catherization  2007   Severe 3-vessel disease.  EF 20-25%.  . ESOPHAGOGASTRODUODENOSCOPY  2001   Esophageal Tear  . ESOPHAGOGASTRODUODENOSCOPY  10/27/2012   Procedure: ESOPHAGOGASTRODUODENOSCOPY (EGD);  Surgeon: Irene Shipper, MD;  [facility] ENDOSCOPY;  Service: Endoscopy;  Laterality: N/A;  pat  . JOINT REPLACEMENT Right 2001   Knee  . TOTAL KNEE ARTHROPLASTY  2001     OB History   No obstetric history on file.      Home Medications    Prior to Admission medications   Medication Sig Start Date End Date Taking? Authorizing Provider  acetaminophen (TYLENOL) 500 MG tablet Take 500 mg by mouth every 6 (six) hours as needed for moderate pain.     [provider]  albuterol (VENTOLIN HFA) 108 (90 Base) MCG/ACT inhaler Inhale 2 puffs into the lungs every 6 (six) hours as needed. For shortness of breath 07/15/19   [name] More, DO  Amino Acids-Protein Hydrolys (FEEDING SUPPLEMENT, PRO-STAT SUGAR FREE 64,) LIQD Take 30 mLs by mouth 2 (two) times daily with a  meal. ensure 01/25/19   [name] Haymaker, MD  amLODipine (NORVASC) 5 MG tablet Take 1 tablet (5 mg total) by mouth daily. 02/14/19   [place] Bing, DO  aspirin EC 325 MG tablet Take 1 tablet (325 mg total) by mouth daily. 07/05/18   [place] Bing, DO  azithromycin (ZITHROMAX) 250 MG tablet 500 mg on day 1, followed by 250 mg once daily for 4 days Patient not taking: Reported on 08/05/2019 07/15/19   [name] More, DO  carvedilol (COREG) 12.5 MG tablet Take 1 tablet (12.5 mg total) by mouth 2 (two) times daily with a meal. 02/06/19   [place] Bing, DO  cephALEXin (KEFLEX) 500 MG capsule Take 1 capsule (500 mg total) by mouth 2 (two) times daily for 10 days. 08/05/19 08/15/19  Little, Wenda Overland, MD  diclofenac sodium (VOLTAREN) 1 % GEL Apply 2 g topically 4 (four) times daily. 08/29/18   Meccariello, Bernita Raisin, DO  donepezil (ARICEPT) 10 MG tablet Take 1 tablet (10 mg total) by mouth at bedtime. 07/05/19  Mullis, Kiersten P, DO  doxycycline (VIBRAMYCIN) 100 MG capsule Take 1 capsule (100 mg total) by mouth 2 (two) times daily. Patient not taking: Reported on 08/05/2019 05/02/19   Isla Pence, MD  fluticasone [facility]) 50 MCG/ACT nasal spray PLACE 1 SPRAY INTO BOTH NOSTRILS DAILY. 04/30/19 04/29/20  [place] Bing, DO  HYDROcodone-acetaminophen (NORCO/VICODIN) 5-325 MG tablet Take 2 tablets by mouth every 6 (six) hours as needed for severe pain. 04/13/19   Albrizze, Kaitlyn E, PA-C  loratadine (CLARITIN) 10 MG tablet Take 0.5 tablets (5 mg total) by mouth daily as needed for allergies or rhinitis. 06/17/19   Mullis, Kiersten P, DO  losartan (COZAAR) 50 MG tablet Take 1 tablet (50 mg total) by mouth at bedtime. 07/05/19   Mullis, Kiersten P, DO  pantoprazole (PROTONIX) 40 MG tablet Take 1 tablet (40 mg total) by mouth daily. 07/15/19   [name] More, DO  polyethylene glycol (MIRALAX / GLYCOLAX) packet Take 17 g by mouth daily. Patient taking differently: Take 17 g by mouth daily as needed for moderate  constipation.  01/25/19   [name] Haymaker, MD  potassium chloride SA (K-DUR) 20 MEQ tablet Take 1 tablet (20 mEq total) by mouth 2 (two) times daily for 7 days. 04/21/19 08/05/19  Kinnie Feil, PA-C  ranitidine (ZANTAC) 150 MG tablet TAKE 1 TABLET BY MOUTH TWICE A DAY Patient taking differently: Take 150 mg by mouth 2 (two) times daily.  07/29/18   [place] Bing, DO  rosuvastatin (CRESTOR) 20 MG tablet TAKE 1 TABLET BY MOUTH EVERYDAY AT BEDTIME Patient taking differently: Take 20 mg by mouth at bedtime.  09/09/18   [place] Bing, DO  senna-docusate (SENOKOT-S) 8.6-50 MG tablet Take 2 tablets by mouth at bedtime. Patient taking differently: Take 1 tablet by mouth at bedtime as needed for mild constipation.  04/23/17   [place]ia Crews, MD  traZODone (DESYREL) 100 MG tablet TAKE 1 TABLET (100 MG TOTAL) BY MOUTH AT BEDTIME AS NEEDED FOR SLEEP. 07/12/19   Mullis, Kiersten P, DO  cetirizine (ZYRTEC) 5 MG tablet Take 1 tablet (5 mg total) by mouth daily. Take at night time as it can cause some drowsiness 05/24/12 07/09/12  Losq, Burnell Blanks, MD    Family History Family History  Problem Relation Age of Onset  . Sickle cell anemia Other   . Heart disease Mother   . Heart disease Father   . Cancer Daughter 41       breast ca  . Hypertension Daughter   . Cancer Daughter 38       breast ca  . Hypertension Daughter   . Heart disease Son        Poor circulation-Left Leg  . Diabetes Daughter   . Hypertension Daughter   . Hyperlipidemia Daughter        Poor circulation- Toe amputation    Social History Social History   Tobacco Use  . Smoking status: Never Smoker  . Smokeless tobacco: Current User    Types: Snuff  Substance Use Topics  . Alcohol use: No  . Drug use: No     Allergies   Tape and Lisinopril   Review of Systems Review of Systems  Constitutional: Negative for fever.  Respiratory: Negative for shortness of breath.   Cardiovascular: Negative for chest pain.   Neurological: Negative for weakness and headaches.  All other systems reviewed and are negative.    Physical Exam Updated Vital Signs BP (!) 159/63   Pulse (!) 50  Temp 98.2 F (36.8 C) (Oral)   Resp 16   SpO2 100%   Physical Exam Vitals signs and nursing note reviewed.  Constitutional:      Appearance: She is well-developed. She is not ill-appearing.     Comments: Elderly, frail  HENT:     Head: Normocephalic and atraumatic.     Right Ear: External ear normal.     Left Ear: External ear normal.  Eyes:     General: No scleral icterus.       Right eye: No discharge.        Left eye: No discharge.     Conjunctiva/sclera: Conjunctivae normal.  Neck:     Musculoskeletal: Neck supple.     Trachea: No tracheal deviation.  Cardiovascular:     Rate and Rhythm: Normal rate and regular rhythm.  Pulmonary:     Effort: Pulmonary effort is normal. No respiratory distress.     Breath sounds: Normal breath sounds. No stridor. No wheezing or rales.  Abdominal:     General: Bowel sounds are normal. There is no distension.     Palpations: Abdomen is soft.     Tenderness: There is no abdominal tenderness. There is no guarding or rebound.  Musculoskeletal:        General: No tenderness.     Right shoulder: She exhibits no tenderness, no bony tenderness and no swelling.     Left shoulder: She exhibits no tenderness, no bony tenderness and no swelling.     Right wrist: She exhibits no tenderness, no bony tenderness and no swelling.     Left wrist: She exhibits no tenderness, no bony tenderness and no swelling.     Right hip: She exhibits normal range of motion, no tenderness, no bony tenderness and no swelling.     Left hip: She exhibits normal range of motion, no tenderness and no bony tenderness.     Right ankle: She exhibits no swelling. No tenderness.     Left ankle: She exhibits no swelling. No tenderness.     Cervical back: She exhibits no tenderness, no bony tenderness and no  swelling.     Thoracic back: She exhibits no tenderness, no bony tenderness and no swelling.     Lumbar back: She exhibits no tenderness, no bony tenderness and no swelling.  Skin:    General: Skin is warm and dry.     Findings: No rash.  Neurological:     Mental Status: She is alert.     Cranial Nerves: No cranial nerve deficit (no facial droop, extraocular movements intact, no slurred speech).     Sensory: No sensory deficit.     Motor: No abnormal muscle tone or seizure activity.     Coordination: Coordination normal.      ED Treatments / Results  Labs (all labs ordered are listed, but only abnormal results are displayed) Labs Reviewed  COMPREHENSIVE METABOLIC PANEL - Abnormal; Notable for the following components:      Result Value   Potassium 2.5 (*)    Glucose, Bld 115 (*)    Albumin 3.1 (*)    All other components within normal limits  CBC WITH DIFFERENTIAL/PLATELET - Abnormal; Notable for the following components:   Hemoglobin 9.7 (*)    HCT 31.0 (*)    MCV 71.6 (*)    MCH 22.4 (*)    RDW 15.8 (*)    All other components within normal limits  URINALYSIS, ROUTINE W REFLEX MICROSCOPIC - Abnormal; Notable  for the following components:   Ketones, ur 5 (*)    Protein, ur 30 (*)    All other components within normal limits  SARS CORONAVIRUS 2 (TAT 6-24 HRS)  LIPASE, BLOOD  CK  POC OCCULT BLOOD, ED    EKG None  Radiology Dg Abd Acute W/chest  Result Date: 08/10/2019 CLINICAL DATA:  Nausea and vomiting EXAM: DG ABDOMEN ACUTE W/ 1V CHEST COMPARISON:  None. FINDINGS: Normal mediastinum and cardiac silhouette. Normal pulmonary vasculature. No evidence of effusion, infiltrate, or pneumothorax. No acute bony abnormality. No dilated large or small bowel. No intraperitoneal free air. Gas in the rectum. Surgical clips in the lower pelvis. Degenerate changes of the spine.  Osteoarthritis of the hips. IMPRESSION: 1.  No acute cardiopulmonary process. 2. No bowel obstruction.  Electronically Signed   By: Suzy Bouchard M.D.   On: 08/10/2019 14:54    Procedures Procedures (including critical care time)  Medications Ordered in ED Medications  sodium chloride 0.9 % bolus 500 mL (0 mLs Intravenous Stopped 08/10/19 1823)    And  0.9 %  sodium chloride infusion (has no administration in time range)  lidocaine (PF) (XYLOCAINE) 1 % injection (has no administration in time range)  potassium chloride SA (K-DUR) CR tablet 40 mEq (has no administration in time range)  potassium chloride 10 mEq in 100 mL IVPB (has no administration in time range)  ondansetron (ZOFRAN) injection 4 mg (4 mg Intravenous Given 08/10/19 1555)     Initial Impression / Assessment and Plan / ED Course  I have reviewed the triage vital signs and the nursing notes.  Pertinent labs & imaging results that were available during my care of the patient were reviewed by me and considered in my medical decision making (see chart for details).  Clinical Course as of Aug 09 1854  Sun Aug 10, 2019  1606 Iv placed.  Waiting on phlebotomy for blood draw   [JK]  1725 Blood was drawn by me using ultrasound guidance.  Butterfly needle used in the right IJ   [JK]  1745 Slight decrease in hemoglobin from previous.   [id number] Chornically low MCV   [JK]    Clinical Course User Index [JK] Dorie Rank, MD     Patient presented to the ED for complaints of generalized weakness and myalgias.  Patient's urinalysis does not suggest persistent infection.  She seems to be responding to the antibiotics.  She does have worsening anemia but no evidence of blood in her stool.  No signs of active GI bleeding.  Patient does have notable hypokalemia.  This is likely contributing to her weakness.  In setting her age and comorbidities I think she would benefit from potassium replacement and overnight monitoring.  Final Clinical Impressions(s) / ED Diagnoses   Final diagnoses:  Hypokalemia  Weakness  Anemia, unspecified  type      Dorie Rank, MD 08/10/19 [phone]

## 2019-08-11 DIAGNOSIS — I2510 Atherosclerotic heart disease of native coronary artery without angina pectoris: Secondary | ICD-10-CM | POA: Diagnosis not present

## 2019-08-11 DIAGNOSIS — Z85038 Personal history of other malignant neoplasm of large intestine: Secondary | ICD-10-CM | POA: Diagnosis not present

## 2019-08-11 DIAGNOSIS — I110 Hypertensive heart disease with heart failure: Secondary | ICD-10-CM | POA: Diagnosis not present

## 2019-08-11 DIAGNOSIS — Z66 Do not resuscitate: Secondary | ICD-10-CM | POA: Diagnosis not present

## 2019-08-11 DIAGNOSIS — Z9013 Acquired absence of bilateral breasts and nipples: Secondary | ICD-10-CM | POA: Diagnosis not present

## 2019-08-11 DIAGNOSIS — E46 Unspecified protein-calorie malnutrition: Secondary | ICD-10-CM | POA: Diagnosis not present

## 2019-08-11 DIAGNOSIS — Z803 Family history of malignant neoplasm of breast: Secondary | ICD-10-CM | POA: Diagnosis not present

## 2019-08-11 DIAGNOSIS — R531 Weakness: Secondary | ICD-10-CM

## 2019-08-11 DIAGNOSIS — I5032 Chronic diastolic (congestive) heart failure: Secondary | ICD-10-CM | POA: Diagnosis not present

## 2019-08-11 DIAGNOSIS — E876 Hypokalemia: Secondary | ICD-10-CM | POA: Diagnosis not present

## 2019-08-11 DIAGNOSIS — Z20828 Contact with and (suspected) exposure to other viral communicable diseases: Secondary | ICD-10-CM | POA: Diagnosis not present

## 2019-08-11 DIAGNOSIS — E785 Hyperlipidemia, unspecified: Secondary | ICD-10-CM | POA: Diagnosis not present

## 2019-08-11 DIAGNOSIS — N12 Tubulo-interstitial nephritis, not specified as acute or chronic: Secondary | ICD-10-CM | POA: Diagnosis not present

## 2019-08-11 LAB — CBC
HCT: 31.1 % — ABNORMAL LOW (ref 36.0–46.0)
Hemoglobin: 9.7 g/dL — ABNORMAL LOW (ref 12.0–15.0)
MCH: 22.3 pg — ABNORMAL LOW (ref 26.0–34.0)
MCHC: 31.2 g/dL (ref 30.0–36.0)
MCV: 71.5 fL — ABNORMAL LOW (ref 80.0–100.0)
Platelets: 172 10*3/uL (ref 150–400)
RBC: 4.35 MIL/uL (ref 3.87–5.11)
RDW: 15.7 % — ABNORMAL HIGH (ref 11.5–15.5)
WBC: 4.2 10*3/uL (ref 4.0–10.5)
nRBC: 0 % (ref 0.0–0.2)

## 2019-08-11 LAB — BASIC METABOLIC PANEL
Anion gap: 9 (ref 5–15)
BUN: 6 mg/dL — ABNORMAL LOW (ref 8–23)
CO2: 23 mmol/L (ref 22–32)
Calcium: 10.1 mg/dL (ref 8.9–10.3)
Chloride: 108 mmol/L (ref 98–111)
Creatinine, Ser: 0.85 mg/dL (ref 0.44–1.00)
GFR calc Af Amer: >60 mL/min (ref >60)
GFR calc non Af Amer: 59 mL/min — ABNORMAL LOW (ref >60)
Glucose, Bld: 80 mg/dL (ref 70–99)
Potassium: 3.4 mmol/L — ABNORMAL LOW (ref 3.5–5.1)
Sodium: 140 mmol/L (ref 135–145)

## 2019-08-11 LAB — MAGNESIUM: Magnesium: 1.7 mg/dL (ref 1.7–2.4)

## 2019-08-11 LAB — SARS CORONAVIRUS 2 (TAT 6-24 HRS): SARS Coronavirus 2: NEGATIVE

## 2019-08-11 MED ORDER — POTASSIUM CHLORIDE 20 MEQ PO PACK
40.0000 meq | PACK | Freq: Once | ORAL | Status: AC
  Administered 2019-08-11: 40 meq via ORAL
  Filled 2019-08-11: qty 2

## 2019-08-11 MED ORDER — PANTOPRAZOLE SODIUM 40 MG PO TBEC
40.0000 mg | DELAYED_RELEASE_TABLET | Freq: Every day | ORAL | Status: DC
  Administered 2019-08-11 – 2019-08-13 (×3): 40 mg via ORAL
  Filled 2019-08-11 (×3): qty 1

## 2019-08-11 MED ORDER — MAGNESIUM SULFATE 2 GM/50ML IV SOLN
2.0000 g | Freq: Once | INTRAVENOUS | Status: AC
  Administered 2019-08-11: 2 g via INTRAVENOUS
  Filled 2019-08-11: qty 50

## 2019-08-11 MED ORDER — POTASSIUM CHLORIDE CRYS ER 20 MEQ PO TBCR
40.0000 meq | EXTENDED_RELEASE_TABLET | ORAL | Status: DC
  Administered 2019-08-11: 40 meq via ORAL
  Filled 2019-08-11: qty 2

## 2019-08-11 MED ORDER — FERROUS SULFATE 325 (65 FE) MG PO TABS
325.0000 mg | ORAL_TABLET | Freq: Every day | ORAL | Status: DC
  Administered 2019-08-11 – 2019-08-13 (×3): 325 mg via ORAL
  Filled 2019-08-11 (×2): qty 1

## 2019-08-11 MED ORDER — FERROUS SULFATE 325 (65 FE) MG PO TABS: 325.0000 mg | ORAL_TABLET | Freq: Every day | ORAL | 0 refills | Status: DC

## 2019-08-11 NOTE — Discharge Summary (Deleted)
[facility] Discharge Summary  [name] Medical record number: [id number] Date of birth: Dec 02, 1923 Age: 85 y.o. Gender: female Date of Admission: 08/10/2019  Date of Discharge: 08/12/2019 Admitting Physician: Leeanne Rio, MD  Primary Care Provider: Danna Hefty, DO Consultants: None  Indication for Hospitalization: Generalized weakness, Hypokalemia  Discharge Diagnoses/Problem List:  Generalized weakness Hypokalemia Hypomagnesemia Klebsiella UTI Chronic microcytic anemia Iron deficiency anemia HFpEF (EF 65%) Hypertension Type 2 Diabetes Hyperlipidemia GERD Insomnia Dementia  Disposition: SNF  Discharge Condition: Improved  Discharge Exam:  General: Pleasant, elderly appearing lady in no acute distress CV: RRR, no MRG Lungs: CTA B, comfortable work of breathing on room air Abdomen: Soft, nontender, nondistended, normal bowel sounds Extremities: warm and well perfused Neuro: Oriented to self and place but not to time  Brief Hospital Course:  [name] is a 85 y.o. female with past medical history significant for chronic hypokalemia, dementia, Insomnia, chronic microcytic anemia 2/2 to IDA, HFpEF, HTN, T2DM, GERD, protein calorie malnutrition, who presented with generalized weakness and found to have hypokalemia of 2.5. She endorsed GI upset and one episode of emesis prior to admission. Expected generalized weakness secondary to poor PO intake in setting of recent GI upset and hypokalemia. Patient received IV fluids and potassium repletion with improvement in her symptoms. PT/OT was consulted for evaluation and recommended skilled nursing for improvement in mobility and balance.  Patient presented with recent diagnosis of Klebsiella UTI. She was started on a 10-day course of Keflex 500mg  BID on 9/23. She should finish this course on 10/2.  Issues for Follow Up:  1. Please ensure patient finishes entire course  of antibiotics for her UTI 2. Recommend weekly to every other week BMP's to monitor potassium and replete if necessary 3. Please monitor blood pressures and consider adjusting doses given age and currently on 3 blood pressure lowering agents  Significant Procedures: None  Significant Labs and Imaging:  Recent Labs  Lab 08/10/19 1722 08/11/19 0349 08/12/19 0504  WBC 4.3 4.2 3.7*  HGB 9.7* 9.7* 9.0*  HCT 31.0* 31.1* 28.4*  PLT 203 172 162   Recent Labs  Lab 08/10/19 1722 08/11/19 0349 08/12/19 0504  NA 141 140 139  K 2.5* 3.4* 4.3  CL 103 108 108  CO2 27 23 24   GLUCOSE 115* 80 105*  BUN 9 6* 12  CREATININE 0.78 0.85 0.94  CALCIUM 10.3 10.1 10.4*  MG  --  1.7  --   ALKPHOS 60  --   --   AST 17  --   --   ALT 7  --   --   ALBUMIN 3.1*  --   --     Lipase 33 CK 82 FOBT negative COVID neg  Results/Tests Pending at Time of Discharge: None  Discharge Medications:  Allergies as of 08/12/2019      Reactions   Tape Other (See Comments)   Skin is very thin and will tear and bruise easily!!   Lisinopril Cough      Medication List    TAKE these medications   acetaminophen 500 MG tablet Commonly known as: TYLENOL Take 500 mg by mouth every 6 (six) hours as needed for moderate pain.   albuterol 108 (90 Base) MCG/ACT inhaler Commonly known as: VENTOLIN HFA Inhale 2 puffs into the lungs every 6 (six) hours as needed. For shortness of breath   amLODipine 5 MG tablet Commonly known as: NORVASC Take 1 tablet (5 mg  total) by mouth daily.   aspirin EC 325 MG tablet Take 1 tablet (325 mg total) by mouth daily.   carvedilol 12.5 MG tablet Commonly known as: COREG Take 1 tablet (12.5 mg total) by mouth 2 (two) times daily with a meal.   cephALEXin 500 MG capsule Commonly known as: KEFLEX Take 1 capsule (500 mg total) by mouth 2 (two) times daily for 10 days.   diclofenac sodium 1 % Gel Commonly known as: VOLTAREN Apply 2 g topically 4 (four) times daily.    donepezil 10 MG tablet Commonly known as: Aricept Take 1 tablet (10 mg total) by mouth at bedtime.   feeding supplement (PRO-STAT SUGAR FREE 64) Liqd Take 30 mLs by mouth 2 (two) times daily with a meal. ensure   ferrous sulfate 325 (65 FE) MG tablet Take 1 tablet (325 mg total) by mouth daily with breakfast.   fluticasone 50 MCG/ACT nasal spray Commonly known as: FLONASE PLACE 1 SPRAY INTO BOTH NOSTRILS DAILY. What changed:   when to take this  reasons to take this   HYDROcodone-acetaminophen 5-325 MG tablet Commonly known as: NORCO/VICODIN Take 2 tablets by mouth every 6 (six) hours as needed for severe pain.   loratadine 10 MG tablet Commonly known as: CLARITIN Take 0.5 tablets (5 mg total) by mouth daily as needed for allergies or rhinitis.   losartan 50 MG tablet Commonly known as: COZAAR Take 1 tablet (50 mg total) by mouth at bedtime.   pantoprazole 40 MG tablet Commonly known as: PROTONIX Take 1 tablet (40 mg total) by mouth daily.   polyethylene glycol 17 g packet Commonly known as: MIRALAX / GLYCOLAX Take 17 g by mouth daily.   potassium chloride SA 20 MEQ tablet Commonly known as: KLOR-CON Take 1 tablet (20 mEq total) by mouth 2 (two) times daily for 7 days.   ranitidine 150 MG tablet Commonly known as: ZANTAC TAKE 1 TABLET BY MOUTH TWICE A DAY   rosuvastatin 20 MG tablet Commonly known as: CRESTOR TAKE 1 TABLET BY MOUTH EVERYDAY AT BEDTIME What changed: See the new instructions.   senna-docusate 8.6-50 MG tablet Commonly known as: Senokot-S Take 2 tablets by mouth at bedtime. What changed:   how much to take  when to take this  reasons to take this   traZODone 100 MG tablet Commonly known as: DESYREL TAKE 1 TABLET (100 MG TOTAL) BY MOUTH AT BEDTIME AS NEEDED FOR SLEEP. What changed: when to take this       Discharge Instructions: Please refer to Patient Instructions section of EMR for full details.  Patient was counseled important  signs and symptoms that should prompt return to medical care, changes in medications, dietary instructions, activity restrictions, and follow up appointments.   Follow-Up Appointments: Follow-up [place] Follow up.   Why: Community Case Management Contact information: [place] [place], [place] Follow up.   Specialty: [place] Why: rehab Contact information: [address] [place] [address] [place]. Go on 08/15/2019.   Why: at 1:30pm for hospital follow up. Contact information: [place] [id number]          Danna Hefty, DO 08/12/2019, 1:32 PM PGY-2, [place]

## 2019-08-11 NOTE — Plan of Care (Signed)
  Problem: Health Behavior/Discharge Planning: Goal: Ability to manage health-related needs will improve Outcome: Progressing Note: Pt is able to ambulate with one assist in the room during my care.    Problem: Clinical Measurements: Goal: Will remain free from infection Outcome: Progressing Note: Pt has remained free from any signs of infection during my care.    Problem: Pain Managment: Goal: General experience of comfort will improve Outcome: Progressing Note: Pt has had no complaints of pain during my care.    Problem: Nutrition: Goal: Adequate nutrition will be maintained Outcome: Not Progressing Note: Pt has a very poor appetite and will only eat a few bites from each meal during my shift.    Pt voiced d/c concerns about going home with daughter. Per PT, pt request to go to SNF.

## 2019-08-11 NOTE — Care Management (Signed)
CM following for DCP needs. Patient currently meets under OBS status, so will not qualify for SNF placement under Medicare at this time. CM will continue to follow and discuss [place] for increased [facility] and aide service needs/[facility] community services.  Midge Minium RN, BSN, NCM-BC, ACM-RN [phone]

## 2019-08-11 NOTE — Discharge Instructions (Signed)
Your hospitalized for weakness which is mainly due to low electrolytes.  While you are in the hospital, we gave you the electrolytes he needed saw some improvement.  Please follow-up with our clinic at the time noted on your paperwork on 10/1.  Continue taking your antibiotics for a possible kidney infection.  Remember, this antibiotic is supposed to be taken 2 times a day.  Once in the morning and once in the evening.  Hypokalemia Hypokalemia means that the amount of potassium in the blood is lower than normal. Potassium is a chemical (electrolyte) that helps regulate the amount of fluid in the body. It also stimulates muscle tightening (contraction) and helps nerves work properly. Normally, most of the body's potassium is inside cells, and only a very small amount is in the blood. Because the amount in the blood is so small, minor changes to potassium levels in the blood can be life-threatening. What are the causes? This condition may be caused by:  Antibiotic medicine.  Diarrhea or vomiting. Taking too much of a medicine that helps you have a bowel movement (laxative) can cause diarrhea and lead to hypokalemia.  Chronic kidney disease (CKD).  Medicines that help the body get rid of excess fluid (diuretics).  Eating disorders, such as bulimia.  Low magnesium levels in the body.  Sweating a lot. What are the signs or symptoms? Symptoms of this condition include:  Weakness.  Constipation.  Fatigue.  Muscle cramps.  Mental confusion.  Skipped heartbeats or irregular heartbeat (palpitations).  Tingling or numbness. How is this diagnosed? This condition is diagnosed with a blood test. How is this treated? This condition may be treated by:  Taking potassium supplements by mouth.  Adjusting the medicines that you take.  Eating more foods that contain a lot of potassium. If your potassium level is very low, you may need to get potassium through an IV and be monitored in  the hospital. Follow these instructions at home:   Take over-the-counter and prescription medicines only as told by your health care provider. This includes vitamins and supplements.  Eat a healthy diet. A healthy diet includes fresh fruits and vegetables, whole grains, healthy fats, and lean proteins.  If instructed, eat more foods that contain a lot of potassium. This includes: ? Nuts, such as peanuts and pistachios. ? Seeds, such as sunflower seeds and pumpkin seeds. ? Peas, lentils, and lima beans. ? Whole grain and bran cereals and breads. ? Fresh fruits and vegetables, such as apricots, avocado, bananas, cantaloupe, kiwi, oranges, tomatoes, asparagus, and potatoes. ? Orange juice. ? Tomato juice. ? Red meats. ? Yogurt.  Keep all follow-up visits as told by your health care provider. This is important. Contact a health care provider if you:  Have weakness that gets worse.  Feel your heart pounding or racing.  Vomit.  Have diarrhea.  Have diabetes (diabetes mellitus) and you have trouble keeping your blood sugar (glucose) in your target range. Get help right away if you:  Have chest pain.  Have shortness of breath.  Have vomiting or diarrhea that lasts for more than 2 days.  Faint. Summary  Hypokalemia means that the amount of potassium in the blood is lower than normal.  This condition is diagnosed with a blood test.  Hypokalemia may be treated by taking potassium supplements, adjusting the medicines that you take, or eating more foods that are high in potassium.  If your potassium level is very low, you may need to get potassium through an  IV and be monitored in the hospital. This information is not intended to replace advice given to you by your health care provider. Make sure you discuss any questions you have with your health care provider. Document Released: 10/30/2005 Document Revised: 06/12/2018 Document Reviewed: 06/12/2018 Elsevier Patient Education   2020 [employer].

## 2019-08-11 NOTE — Consult Note (Signed)
   [facility] Inpatient Consult   08/11/2019  JAIDA BERRIER [birth year] [id number]   THN Status:  Pending  Patient was assessed for [place] [facility] for community services. Patient was previously active with [place] [facility].    Patient has been assessed for services in the Medicare ACO and qualifies.  Patient in observation status and seen for hypokalemia with weakness and generalized pain.  Patient has an active consent form signed on file.   Plan: Reviewed inpatient [facility] team notes for recommendations and will follow for disposition and needs.    Of note, [facility] Care Management services does not replace or interfere with any services that are arranged by inpatient [facility] care management team.   For additional questions or referrals please contact:  Natividad Brood, RN BSN [facility] Liaison  [phone] business mobile phone Toll free office [phone]  Fax number: [phone] [email or web address]

## 2019-08-11 NOTE — TOC Transition Note (Signed)
Transition of Care [facility]) - CM/SW Discharge Note   Patient Details  Name: [name] MRN: [id number] Date of Birth: [birth year]  Transition of Care [facility]) CM/SW Contact:  Midge Minium RN, BSN, NCM-BC, ACM-RN [phone] Phone Number: 08/11/2019, 4:19 PM   Clinical Narrative:    CM following for transitional care needs; spoke to patients daughter/legal guardian, [name] to discuss the POC. Patient lives at home with her daughter, who assists with her ADLs; has a RW, BSC, W/C for ambulation. PT/OT eval completed with SNF recommended; patient does not qualify for SNF placement under Medicare d/t OBS status. CM discuss options with patients daughter, with daughter agreeable to the patient returning home with [facility] services. CMS HH Compare list provided with [place] selected for increased [facility] aide services. HH referral given to Adela Lank RN [facility] liaison); AVS updated. [facility] referral for community resources. Daughter stated she will provide transportation home. CM will continue to follow.    Final next level of care: [place] Barriers to Discharge: Continued Medical Work up   Patient Goals and CMS Choice Patient states their goals for this hospitalization and ongoing recovery are:: Patients daughter wants the patient to get better CMS Medicare.gov Compare Post Acute Care list provided to:: Legal Guardian Choice offered to / list presented to : Adult Children([name] Aretha Parrot)   Discharge Plan and Services           DME Arranged: N/A DME Agency: NA HH Arranged: RN, Disease Management, PT, OT, Nurse's Aide, Social Work [employer] Agency: [place] Date [facility] Agency Contacted: 08/11/19 Time [place]: 1619 Representative spoke with at [place]: Adela Lank RN  Social Determinants of Health (SDOH) Interventions     Readmission Risk Interventions Readmission Risk Prevention Plan 01/25/2019  PCP or Specialist Appt within 3-5 Days Complete   HRI or [place] Complete  Palliative Care Screening Not Applicable  Medication Review (RN Care Manager) Complete  Some recent data might be hidden

## 2019-08-11 NOTE — Evaluation (Signed)
Clinical/Bedside Swallow Evaluation Patient Details  Name: [name] MRN: [id number] Date of Birth: March 11, 1924  Today's Date: 08/11/2019 Time: SLP Start Time (ACUTE ONLY): 1455 SLP Stop Time (ACUTE ONLY): 1510 SLP Time Calculation (min) (ACUTE ONLY): 15 min  Past Medical History:  Past Medical History:  Diagnosis Date  . Allergy   . Anemia    Iron def anemia  . Arthritis   . Asthma   . Breast cancer ([place]) 10/2010   Invasive Ductal Carcinoma, s/p bilateral mastectomy, now on Tamoxifen. Followed by Dr Jamse Arn.   . Breast cancer ([place]) 2011   s/p Bilateral masectomy  . CAD (coronary artery disease)   . Carotid artery aneurysm ([place]) 08/2010   right ICA, 5 x 79mm  . CHF (congestive heart failure) (HCC)    Systolic   . Colon cancer ([place])   . Colon cancer ([place]) 12/11/2013  . DDD (degenerative disc disease), cervical   . Diabetes mellitus   . Diverticulosis   . GERD (gastroesophageal reflux disease)   . H/O: GI bleed   . Hiatal hernia   . History of colon cancer   . Hypercalcemia 06/09/2014  . Hyperlipidemia   . Hypertension   . Myocardial infarction ([place])   . Neuropathy   . Pneumonia 03/2012  . Pressure injury of heel, stage 2 ([place]) 08/31/2016  . Shortness of breath    Past Surgical History:  Past Surgical History:  Procedure Laterality Date  . Breast masectomy  10/2010   Bilateral masectomy by Dr Lucia Gaskins s/p invasive ductal carcinoma.  Marland Kitchen BREAST SURGERY    . Cardiac  Catherization  2007   Severe 3-vessel disease.  EF 20-25%.  . ESOPHAGOGASTRODUODENOSCOPY  2001   Esophageal Tear  . ESOPHAGOGASTRODUODENOSCOPY  10/27/2012   Procedure: ESOPHAGOGASTRODUODENOSCOPY (EGD);  Surgeon: Irene Shipper, MD;  Location[facility] ENDOSCOPY;  Service: Endoscopy;  Laterality: N/A;  pat  . JOINT REPLACEMENT Right 2001   Knee  . TOTAL KNEE ARTHROPLASTY  2001   HPI:  85 y.o. female presenting with generalized weakness and non-focal pain. PMH is significant for hypokalemia, dementia, IDA, HFpEF,  HTN, T2DM, GERD, Malnutrition. Amitted with Generalized weakness and pain, UTI and found to haveAcute on Chronic Hypokalemia   Assessment / Plan / Recommendation Clinical Impression  Oral and pharyngeal swallow function suspected to appear intact. Pt may have compopnent of esophageal dysphagia per symptoms exhibited this date and reported by staff and the patient. RN reports difficulty with larger PO medicines this am, noted having to crush larger medicines. Pt reports hx of daily regurgitation, pt with basin on bedside table, with regurgitated contents.  No overt s/sx of aspiration exhibited with any POs. Belching was exhibited, more consistent with possible esophageal dysphagia . Continue regular thin liquid diet with medicines in puree, crush larger medicines. No further ST needs identified. Esophageal workup may be indicated per MD discretion if pt exhibits clinically difficulty.   SLP Visit Diagnosis: Dysphagia, unspecified (R13.10)    Aspiration Risk  Mild aspiration risk    Diet Recommendation  Regular, thin liquids   Medication Administration: Whole meds with puree(crush larger PO medicines)    Other  Recommendations Recommended Consults: Consider esophageal assessment(if difficulty persists) Oral Care Recommendations: Oral care BID   Follow up Recommendations 24 hour supervision/assistance      Frequency and Duration            Prognosis Prognosis for Safe Diet Advancement: Good      Swallow Study   General Date of  Onset: 08/10/19 HPI: 85 y.o. female presenting with generalized weakness and non-focal pain. PMH is significant for hypokalemia, dementia, IDA, HFpEF, HTN, T2DM, GERD, Malnutrition. Amitted with Generalized weakness and pain, UTI and found to haveAcute on Chronic Hypokalemia Type of Study: Bedside Swallow Evaluation Previous Swallow Assessment: prior BSE Diet Prior to this Study: Regular;Thin liquids Temperature Spikes Noted: No Respiratory Status: Room  air History of Recent Intubation: No Behavior/Cognition: Alert;Cooperative;Pleasant mood Oral Cavity Assessment: Within Functional Limits Oral Care Completed by SLP: No Oral Cavity - Dentition: Dentures, top;Dentures, bottom Vision: Functional for self-feeding Self-Feeding Abilities: Able to feed self Patient Positioning: Upright in chair Baseline Vocal Quality: Normal Volitional Swallow: Able to elicit    Oral/Motor/Sensory Function Overall Oral Motor/Sensory Function: Within functional limits   Ice Chips     Thin Liquid Thin Liquid: Within functional limits    Nectar Thick Nectar Thick Liquid: Not tested   Honey Thick Honey Thick Liquid: Not tested   Puree Puree: Within functional limits   Solid     Solid: Within functional limits      Antwoine Zorn E Angelise Petrich MA, CCC-SLP Acute Rehabilitation Services 08/11/2019,3:20 PM

## 2019-08-11 NOTE — Plan of Care (Signed)

## 2019-08-11 NOTE — Care Management Obs Status (Signed)
[facility] NOTIFICATION   Patient Details  Name: [name] MRN: [id number] Date of Birth: 1924/02/01   Medicare Observation Status Notification Given:  Yes  Spoke to the patients daughter, [name]; Form to be mailed to patients daughter as requested  Georgeanna Lea, RN 08/11/2019, 4:17 PM

## 2019-08-11 NOTE — Progress Notes (Signed)
Family Medicine Teaching Service Daily Progress Note Intern Pager: [phone]  [name] Medical record number: [id number] Date of birth: [birth year] Age: 85 y.o. Gender: female  Primary Care Provider: Danna Hefty, DO Consultants: None Code Status: DNR  Pt Overview and Major Events to Date:  9/27 - Admitted to FPTS  Assessment and Plan: [name] is a 85 y.o. female presenting with generalized weakness and non-focal pain. PMH is significant for hypokalemia, dementia, IDA, HFpEF, HTN, T2DM, GERD, Malnutrition.  Weakness  Recent GI upset  Acute on Chronic Hypokalemia  Hypomagnesemia: Improving. Admitted for generalized weakness and recent GI upset with emesis and found to have potassium of 2.5. Received 60 mEq potassium overnight. K improved to 3.4 this AM. Mag 1.7. Weakness likely 2/2 to poor PO intake in setting of recent GI upset and hypokalemia. EKG with NSR this AM. Patient notes she is feeling much better this AM. Denies any further nausea or vomiting.  Patient would like to got to SNF if recommended by PT/OT. - 40 mEq Kdur x 3 - 2g IV mag - PT/OT recs - zofran PRN for nausea - vitals per routine - regular diet    Klebsiella UTI: Diagnosed with UTI on 9/22, culture positive for Klebsiella. Currently on 10-day course of Keflex. Denies any urinary symptoms. - continue Keflex 500mg  BID (9/22-10/2)  Chronic Microcytic Anemia  Iron Deficiency: stable Hgb 9.7>9.7 (baseline 10), MCV 71.5. Labs on 05/2018 consistent with iron deficiency. No signs of bleeding on exam - iron supplement - continue to monitor  HFpEF (EF 65%): chronic, stable Last echo (05/2018) notable for EF 65%, G1DD. Home meds: Losartan 50mg  QD and Carvedilol 12.5mg  BID. - continue home meds  HTN:  BP elevated on admission, moderately improved overnight to 140-160's/60's. One episode of hypotension this AM of 89/61. Unclear etiology for low blood pressure - continue to  monitor  T2DM: Last A1C 6.7 (05/2018). Diet controlled.  - daily BMP  HLD:  Home meds: Rosuvastatin 20mg  QD.  - given age and lack of benefit, consider discontinuation on discharge to minimize daily medications  GERD:  Home meds: Protonix 40mg  QD and Zantac 150mg  QD - continue home meds  Insomnia  Dementia: Home meds: Trazodone 100mg  qHS, Aricept 10mg  qHS. MoCA score in March 2020 notable for score of 5/19 (primarily due to inability to read and write). Patient is dependent on daughter [name] for ADLs/IADLs. She notes she feels like she is not getting the best care at home given 6 grandchildren in the home makes it difficult for her daughter/care giver to give her the adequate care she needs. She requests SNF if recommended by PT/OT. Patient is very cognitively with it and able to describe and explain her home settings and current medical condition.  - continue home meds - PT/OT  Moderate Protein Calorie Malnutrition: - continue Ensure between meals  Nicotine use d/o She reports a long history ofdippingsnuff. She is not interested in a nicotine patch at this time. -Continue to monitor  FEN/GI: Heart Healthy  PPx: Lovenox  Disposition: pending PT/OT recs  Subjective:  Patient notes she is feeling MUCH better than yesterday. She has been tolerating water overnight and looks forward to having breakfast this AM. Denies any concerns or complaints. She explains that she has 6 grandchildren and her daughter at home. She feels like her daughter is not giving her the attentions he needs given the amount of work needed to care for all the children. She notes that  they sometimes have "people on the street" bathe her and come check in on her.   Objective: Temp:  [98.2 F (36.8 C)-98.7 F (37.1 C)] 98.2 F (36.8 C) (09/28 0430) Pulse Rate:  [44-88] 68 (09/28 0430) Resp:  [13-32] 13 (09/27 2200) BP: (89-184)/(41-116) 89/61 (09/28 0430) SpO2:  [98 %-100 %] 100 % (09/27 1730) Physical  Exam: General: pleasant, very thin and frail elderly woman, no acute distress, lying comfortably in bed   CV: regular rate and rhythm without murmurs, rubs, or gallops, no lower extremity edema Lungs: clear to auscultation bilaterally with normal work of breathing on room air Abdomen: soft, non-tender, non-distended, normoactive bowel sounds Skin: warm, dry Extremities: warm and well perfused Neuro: speech normal   Laboratory: Recent Labs  Lab 08/10/19 1722 08/11/19 0349  WBC 4.3 4.2  HGB 9.7* 9.7*  HCT 31.0* 31.1*  PLT 203 172   Recent Labs  Lab 08/10/19 1722 08/11/19 0349  NA 141 140  K 2.5* 3.4*  CL 103 108  CO2 27 23  BUN 9 6*  CREATININE 0.78 0.85  CALCIUM 10.3 10.1  PROT 6.8  --   BILITOT 0.4  --   ALKPHOS 60  --   ALT 7  --   AST 17  --   GLUCOSE 115* 80    Urinalysis    Component Value Date/Time   COLORURINE YELLOW 08/10/2019 1547   APPEARANCEUR CLEAR 08/10/2019 1547   LABSPEC 1.010 08/10/2019 1547   PHURINE 7.0 08/10/2019 1547   GLUCOSEU NEGATIVE 08/10/2019 1547   HGBUR NEGATIVE 08/10/2019 1547   HGBUR negative 10/21/2009 1424   BILIRUBINUR NEGATIVE 08/10/2019 1547   BILIRUBINUR negative 04/09/2019 0940   BILIRUBINUR NEG 08/03/2015 1240   KETONESUR 5 (A) 08/10/2019 1547   PROTEINUR 30 (A) 08/10/2019 1547   UROBILINOGEN 0.2 04/09/2019 0940   UROBILINOGEN 0.2 09/16/2015 1613   NITRITE NEGATIVE 08/10/2019 1547   LEUKOCYTESUR NEGATIVE 08/10/2019 1547    Lipase 33 CK 82 FOBT negative COVID neg  Imaging/Diagnostic Tests: Dg Abd Acute W/chest  Result Date: 08/10/2019 CLINICAL DATA:  Nausea and vomiting EXAM: DG ABDOMEN ACUTE W/ 1V CHEST COMPARISON:  None. FINDINGS: Normal mediastinum and cardiac silhouette. Normal pulmonary vasculature. No evidence of effusion, infiltrate, or pneumothorax. No acute bony abnormality. No dilated large or small bowel. No intraperitoneal free air. Gas in the rectum. Surgical clips in the lower pelvis. Degenerate  changes of the spine.  Osteoarthritis of the hips. IMPRESSION: 1.  No acute cardiopulmonary process. 2. No bowel obstruction. Electronically Signed   By: Suzy Bouchard M.D.   On: 08/10/2019 14:54    Danna Hefty, DO 08/11/2019, 7:50 AM PGY-2, [place] Intern pager: [phone], text pages welcome

## 2019-08-11 NOTE — Evaluation (Signed)
Physical Therapy Evaluation Patient Details Name: [name] MRN: [id number] DOB: 1924/05/23 Today's Date: 08/11/2019   History of Present Illness  [name] is a 85 y.o. female presenting with generalized weakness and non-focal pain. PMH is significant for hypokalemia, dementia, IDA, HFpEF, HTN, T2DM, GERD, Malnutrition. Amitted with Generalized weakness and pain, UTI and found to haveAcute on Chronic Hypokalemia  Clinical Impression  Pt admitted with above diagnosis. Pt needing mod A to get fully to EOB, min A for sit<>stand, and mod A to pivot surface to surface, +2 for safety. Pt has back pain in standing which inhibited ambulation today. She reports that she does not walk at home because no one will assist her. She also reports that her daughter sometimes yells at her. Pt would prefer to be in a rehab environment and this would be a safe option for her.   Pt currently with functional limitations due to the deficits listed below (see PT Problem List). Pt will benefit from skilled PT to increase their independence and safety with mobility to allow discharge to the venue listed below.       Follow Up Recommendations SNF;Supervision for mobility/OOB    Equipment Recommendations  None recommended by PT    Recommendations for Other Services       Precautions / Restrictions Precautions Precautions: Fall Restrictions Weight Bearing Restrictions: No      Mobility  Bed Mobility Overal bed mobility: Needs Assistance Bed Mobility: Supine to Sit     Supine to sit: Mod assist     General bed mobility comments: pt able to initiate sup<>sit and get to EOB with min A but needed mod A to scoot hips out to EOB  Transfers Overall transfer level: Needs assistance Equipment used: None Transfers: Sit to/from [facility] Sit to Stand: +2 safety/equipment;Min assist Stand pivot transfers: +2 safety/equipment;Mod assist       General transfer comment: pt required min  A to power up to stand but needed mod A for support with stepping bed to chair and chair to toilet.   Ambulation/Gait             General Gait Details: pt with back pain in standing  Stairs            Wheelchair Mobility    Modified Rankin (Stroke Patients Only)       Balance Overall balance assessment: Needs assistance Sitting-balance support: No upper extremity supported Sitting balance-Leahy Scale: Fair     Standing balance support: Bilateral upper extremity supported Standing balance-Leahy Scale: Poor                               Pertinent Vitals/Pain Pain Assessment: Faces Faces Pain Scale: Hurts even more Pain Location: back in standing Pain Descriptors / Indicators: Aching;Sore Pain Intervention(s): Limited activity within patient's tolerance;Monitored during session    [place] expects to be discharged to:: Private residence Living Arrangements: Children Available Help at Discharge: Family;Personal care attendant;Available PRN/intermittently Type of Home: House Home Access: Stairs to enter Entrance Stairs-Rails: Psychiatric nurse of Steps: 7 Home Layout: One level Home Equipment: Walker - 2 wheels;Bedside commode;Wheelchair - manual Additional Comments: personal care attendant comes 6 days a week    Prior Function Level of Independence: Needs assistance   Gait / Transfers Assistance Needed: pt reports she has not walked in a long time because no one will help her and she can't walk  alone  ADL's / Homemaking Assistance Needed: caregiver or daughter assist with bathing        Hand Dominance   Dominant Hand: Right    Extremity/Trunk Assessment   Upper Extremity Assessment Upper Extremity Assessment: Defer to OT evaluation    Lower Extremity Assessment Lower Extremity Assessment: Generalized weakness    Cervical / Trunk Assessment Cervical / Trunk Assessment: Kyphotic  Communication    Communication: HOH  Cognition Arousal/Alertness: Awake/alert Behavior During Therapy: WFL for tasks assessed/performed Overall Cognitive Status: Within Functional Limits for tasks assessed                                        General Comments General comments (skin integrity, edema, etc.): have concerns that pt is not getting the help and attention she needs at home    Exercises     Assessment/Plan    PT Assessment Patient needs continued PT services  PT Problem List Decreased strength;Decreased activity tolerance;Decreased balance;Decreased mobility;Pain       PT Treatment Interventions DME instruction;Gait training;Functional mobility training;Therapeutic activities;Therapeutic exercise;Stair training;Balance training;Patient/family education;Neuromuscular re-education    PT Goals (Current goals can be found in the Care Plan section)  Acute Rehab PT Goals Patient Stated Goal: be safe, get help when she needs it PT Goal Formulation: With patient Time For Goal Achievement: 08/25/19 Potential to Achieve Goals: Good    Frequency Min 3X/week   Barriers to discharge Decreased caregiver support pt reports her daughter is very busy with her grandchildren    Co-evaluation PT/OT/SLP Co-Evaluation/Treatment: Yes Reason for Co-Treatment: Complexity of the patient's impairments (multi-system involvement);For patient/therapist safety PT goals addressed during session: Mobility/safety with mobility;Balance;Strengthening/ROM         AM-PAC PT "6 Clicks" Mobility  Outcome Measure Help needed turning from your back to your side while in a flat bed without using bedrails?: A Little Help needed moving from lying on your back to sitting on the side of a flat bed without using bedrails?: A Little Help needed moving to and from a bed to a chair (including a wheelchair)?: A Lot Help needed standing up from a chair using your arms (e.g., wheelchair or bedside chair)?: A  Lot Help needed to walk in hospital room?: Total Help needed climbing 3-5 steps with a railing? : Total 6 Click Score: 12    End of Session Equipment Utilized During Treatment: Gait belt Activity Tolerance: Patient tolerated treatment well Patient left: in chair;with call bell/phone within reach;with chair alarm set Nurse Communication: Mobility status PT Visit Diagnosis: Unsteadiness on feet (R26.81);Muscle weakness (generalized) (M62.81);Pain;Difficulty in walking, not elsewhere classified (R26.2) Pain - part of body: (back)    Time: [id number] PT Time Calculation (min) (ACUTE ONLY): 19 min   Charges:   PT Evaluation $PT Eval Moderate Complexity: [place]  Pager [phone] Office [place] 08/11/2019, 1:16 PM

## 2019-08-11 NOTE — Evaluation (Signed)
Occupational Therapy Evaluation Patient Details Name: [name] MRN: [id number] DOB: [birth year] Today's Date: 08/11/2019    History of Present Illness [name] is a 85 y.o. female presenting with generalized weakness and non-focal pain. PMH is significant for hypokalemia, dementia, IDA, HFpEF, HTN, T2DM, GERD, Malnutrition. Amitted with Generalized weakness and pain, UTI and found to haveAcute on Chronic Hypokalemia   Clinical Impression   This 65 you female admitted with above presents to acute OT with decreased balance and mobility as well as back pain with mobility all affecting her safety, independence and ability to A with self care. She will benefit from acute OT with follow up at SNF.    Follow Up Recommendations  SNF;Other (comment)(if does not qualify for SNF then HHOT and HHAide)    Equipment Recommendations  None recommended by OT       Precautions / Restrictions Precautions Precautions: Fall Restrictions Weight Bearing Restrictions: No      Mobility Bed Mobility Overal bed mobility: Needs Assistance Bed Mobility: Supine to Sit     Supine to sit: Mod assist     General bed mobility comments: pt able to initiate sup<>sit and get to EOB with min A but needed mod A to scoot hips out to EOB  Transfers Overall transfer level: Needs assistance Equipment used: None Transfers: Sit to/from [facility] Sit to Stand: +2 safety/equipment;Min assist Stand pivot transfers: +2 safety/equipment;Mod assist       General transfer comment: pt required min A to power up to stand but needed mod A for support with stepping bed to chair and chair to toilet.     Balance Overall balance assessment: Needs assistance Sitting-balance support: No upper extremity supported Sitting balance-Leahy Scale: Fair     Standing balance support: Bilateral upper extremity supported Standing balance-Leahy Scale: Poor                             ADL  either performed or assessed with clinical judgement   ADL Overall ADL's : Needs assistance/impaired Eating/Feeding: Independent;Sitting   Grooming: Supervision/safety;Set up;Sitting   Upper Body Bathing: Supervision/ safety;Set up;Sitting   Lower Body Bathing: Moderate assistance Lower Body Bathing Details (indicate cue type and reason): Min A sit<>stand Upper Body Dressing : Moderate assistance;Sitting   Lower Body Dressing: Maximal assistance Lower Body Dressing Details (indicate cue type and reason): min A sit<>stand Toilet Transfer: Moderate assistance;Comfort height toilet;Grab bars;Stand-pivot   Toileting- Clothing Manipulation and Hygiene: Total assistance Toileting - Clothing Manipulation Details (indicate cue type and reason): Min A sit<>stand             Vision Patient Visual Report: No change from baseline              Pertinent Vitals/Pain Pain Assessment: Faces Pain Score: 6  Faces Pain Scale: Hurts even more Pain Location: back in standing Pain Descriptors / Indicators: Aching;Sore Pain Intervention(s): Limited activity within patient's tolerance;Monitored during session;Repositioned     Hand Dominance Right   Extremity/Trunk Assessment Upper Extremity Assessment Upper Extremity Assessment: Overall WFL for tasks assessed   Lower Extremity Assessment Lower Extremity Assessment: Generalized weakness   Cervical / Trunk Assessment Cervical / Trunk Assessment: Kyphotic   Communication Communication Communication: HOH   Cognition Arousal/Alertness: Awake/alert Behavior During Therapy: WFL for tasks assessed/performed Overall Cognitive Status: Within Functional Limits for tasks assessed  General Comments  have concerns that pt is not getting the help and attention she needs at home            [place] expects to be discharged to:: Private residence Living Arrangements:  Children Available Help at Discharge: Family;Personal care attendant;Available PRN/intermittently Type of Home: House Home Access: Stairs to enter [employer] of Steps: 7 Entrance Stairs-Rails: Right;Left Home Layout: One level         Biochemist, clinical: Standard     Home Equipment: Environmental consultant - 2 wheels;Bedside commode;Wheelchair - manual   Additional Comments: personal care attendant comes 6 days a week      Prior Functioning/Environment Level of Independence: Needs assistance  Gait / Transfers Assistance Needed: pt reports she has not walked in a long time because no one will help her and she can't walk alone ADL's / Homemaking Assistance Needed: caregiver or daughter assist with bathing            OT Problem List: Decreased strength;Decreased range of motion;Impaired balance (sitting and/or standing);Pain      OT Treatment/Interventions: Self-care/ADL training;DME and/or AE instruction;Patient/family education;Balance training    OT Goals(Current goals can be found in the care plan section) Acute Rehab OT Goals Patient Stated Goal: be safe, get help when she needs it OT Goal Formulation: With patient Time For Goal Achievement: 08/25/19 Potential to Achieve Goals: Good  OT Frequency: Min 2X/week           Co-evaluation PT/OT/SLP Co-Evaluation/Treatment: Yes Reason for Co-Treatment: Complexity of the patient's impairments (multi-system involvement);For patient/therapist safety PT goals addressed during session: Mobility/safety with mobility;Balance;Strengthening/ROM OT goals addressed during session: Strengthening/ROM      AM-PAC OT "6 Clicks" Daily Activity     Outcome Measure Help from another person eating meals?: A Little Help from another person taking care of personal grooming?: A Little Help from another person toileting, which includes using toliet, bedpan, or urinal?: A Lot Help from another person bathing (including washing, rinsing, drying)?:  A Lot Help from another person to put on and taking off regular upper body clothing?: A Lot Help from another person to put on and taking off regular lower body clothing?: Total 6 Click Score: 13   End of Session Equipment Utilized During Treatment: Gait belt Nurse Communication: Mobility status  Activity Tolerance: Patient tolerated treatment well Patient left: in chair;with call bell/phone within reach;with chair alarm set  OT Visit Diagnosis: Unsteadiness on feet (R26.81);Other abnormalities of gait and mobility (R26.89);Muscle weakness (generalized) (M62.81);Pain Pain - part of body: (back)                Time: [id number] OT Time Calculation (min): 27 min Charges:  OT General Charges $OT Visit: 1 Visit OT Evaluation $OT Eval Moderate Complexity: [place], OTR/L Acute [employer] Pager [phone] Office [phone]    Almon Register 08/11/2019, 1:28 PM

## 2019-08-12 DIAGNOSIS — M6281 Muscle weakness (generalized): Secondary | ICD-10-CM | POA: Diagnosis not present

## 2019-08-12 DIAGNOSIS — G459 Transient cerebral ischemic attack, unspecified: Secondary | ICD-10-CM | POA: Diagnosis not present

## 2019-08-12 DIAGNOSIS — K5900 Constipation, unspecified: Secondary | ICD-10-CM | POA: Diagnosis not present

## 2019-08-12 DIAGNOSIS — Z743 Need for continuous supervision: Secondary | ICD-10-CM | POA: Diagnosis not present

## 2019-08-12 DIAGNOSIS — I110 Hypertensive heart disease with heart failure: Secondary | ICD-10-CM | POA: Diagnosis present

## 2019-08-12 DIAGNOSIS — I5032 Chronic diastolic (congestive) heart failure: Secondary | ICD-10-CM | POA: Diagnosis present

## 2019-08-12 DIAGNOSIS — J45998 Other asthma: Secondary | ICD-10-CM | POA: Diagnosis not present

## 2019-08-12 DIAGNOSIS — E1141 Type 2 diabetes mellitus with diabetic mononeuropathy: Secondary | ICD-10-CM | POA: Diagnosis not present

## 2019-08-12 DIAGNOSIS — I2510 Atherosclerotic heart disease of native coronary artery without angina pectoris: Secondary | ICD-10-CM | POA: Diagnosis present

## 2019-08-12 DIAGNOSIS — Z85038 Personal history of other malignant neoplasm of large intestine: Secondary | ICD-10-CM | POA: Diagnosis not present

## 2019-08-12 DIAGNOSIS — C50912 Malignant neoplasm of unspecified site of left female breast: Secondary | ICD-10-CM | POA: Diagnosis not present

## 2019-08-12 DIAGNOSIS — K219 Gastro-esophageal reflux disease without esophagitis: Secondary | ICD-10-CM | POA: Diagnosis present

## 2019-08-12 DIAGNOSIS — I10 Essential (primary) hypertension: Secondary | ICD-10-CM | POA: Diagnosis not present

## 2019-08-12 DIAGNOSIS — N12 Tubulo-interstitial nephritis, not specified as acute or chronic: Secondary | ICD-10-CM | POA: Diagnosis present

## 2019-08-12 DIAGNOSIS — F0390 Unspecified dementia without behavioral disturbance: Secondary | ICD-10-CM | POA: Diagnosis present

## 2019-08-12 DIAGNOSIS — C50911 Malignant neoplasm of unspecified site of right female breast: Secondary | ICD-10-CM | POA: Diagnosis not present

## 2019-08-12 DIAGNOSIS — Z853 Personal history of malignant neoplasm of breast: Secondary | ICD-10-CM | POA: Diagnosis not present

## 2019-08-12 DIAGNOSIS — R52 Pain, unspecified: Secondary | ICD-10-CM | POA: Diagnosis not present

## 2019-08-12 DIAGNOSIS — R2689 Other abnormalities of gait and mobility: Secondary | ICD-10-CM | POA: Diagnosis not present

## 2019-08-12 DIAGNOSIS — M159 Polyosteoarthritis, unspecified: Secondary | ICD-10-CM | POA: Diagnosis not present

## 2019-08-12 DIAGNOSIS — E46 Unspecified protein-calorie malnutrition: Secondary | ICD-10-CM | POA: Diagnosis present

## 2019-08-12 DIAGNOSIS — R279 Unspecified lack of coordination: Secondary | ICD-10-CM | POA: Diagnosis not present

## 2019-08-12 DIAGNOSIS — D509 Iron deficiency anemia, unspecified: Secondary | ICD-10-CM | POA: Diagnosis not present

## 2019-08-12 DIAGNOSIS — R531 Weakness: Secondary | ICD-10-CM

## 2019-08-12 DIAGNOSIS — Z803 Family history of malignant neoplasm of breast: Secondary | ICD-10-CM | POA: Diagnosis not present

## 2019-08-12 DIAGNOSIS — I739 Peripheral vascular disease, unspecified: Secondary | ICD-10-CM | POA: Diagnosis not present

## 2019-08-12 DIAGNOSIS — Z86718 Personal history of other venous thrombosis and embolism: Secondary | ICD-10-CM | POA: Diagnosis not present

## 2019-08-12 DIAGNOSIS — Z86711 Personal history of pulmonary embolism: Secondary | ICD-10-CM | POA: Diagnosis not present

## 2019-08-12 DIAGNOSIS — E785 Hyperlipidemia, unspecified: Secondary | ICD-10-CM | POA: Diagnosis present

## 2019-08-12 DIAGNOSIS — M5030 Other cervical disc degeneration, unspecified cervical region: Secondary | ICD-10-CM | POA: Diagnosis present

## 2019-08-12 DIAGNOSIS — K210 Gastro-esophageal reflux disease with esophagitis: Secondary | ICD-10-CM | POA: Diagnosis present

## 2019-08-12 DIAGNOSIS — Z9013 Acquired absence of bilateral breasts and nipples: Secondary | ICD-10-CM | POA: Diagnosis not present

## 2019-08-12 DIAGNOSIS — I252 Old myocardial infarction: Secondary | ICD-10-CM | POA: Diagnosis not present

## 2019-08-12 DIAGNOSIS — F339 Major depressive disorder, recurrent, unspecified: Secondary | ICD-10-CM | POA: Diagnosis not present

## 2019-08-12 DIAGNOSIS — R079 Chest pain, unspecified: Secondary | ICD-10-CM | POA: Diagnosis not present

## 2019-08-12 DIAGNOSIS — E43 Unspecified severe protein-calorie malnutrition: Secondary | ICD-10-CM | POA: Diagnosis not present

## 2019-08-12 DIAGNOSIS — I517 Cardiomegaly: Secondary | ICD-10-CM | POA: Diagnosis not present

## 2019-08-12 DIAGNOSIS — G4700 Insomnia, unspecified: Secondary | ICD-10-CM | POA: Diagnosis not present

## 2019-08-12 DIAGNOSIS — K449 Diaphragmatic hernia without obstruction or gangrene: Secondary | ICD-10-CM | POA: Diagnosis present

## 2019-08-12 DIAGNOSIS — N390 Urinary tract infection, site not specified: Secondary | ICD-10-CM | POA: Diagnosis not present

## 2019-08-12 DIAGNOSIS — E876 Hypokalemia: Secondary | ICD-10-CM | POA: Diagnosis present

## 2019-08-12 DIAGNOSIS — M109 Gout, unspecified: Secondary | ICD-10-CM | POA: Diagnosis present

## 2019-08-12 DIAGNOSIS — N819 Female genital prolapse, unspecified: Secondary | ICD-10-CM | POA: Diagnosis not present

## 2019-08-12 DIAGNOSIS — E1151 Type 2 diabetes mellitus with diabetic peripheral angiopathy without gangrene: Secondary | ICD-10-CM | POA: Diagnosis present

## 2019-08-12 DIAGNOSIS — Z66 Do not resuscitate: Secondary | ICD-10-CM | POA: Diagnosis present

## 2019-08-12 DIAGNOSIS — Z8673 Personal history of transient ischemic attack (TIA), and cerebral infarction without residual deficits: Secondary | ICD-10-CM | POA: Diagnosis not present

## 2019-08-12 DIAGNOSIS — H04129 Dry eye syndrome of unspecified lacrimal gland: Secondary | ICD-10-CM | POA: Diagnosis not present

## 2019-08-12 DIAGNOSIS — Z20828 Contact with and (suspected) exposure to other viral communicable diseases: Secondary | ICD-10-CM | POA: Diagnosis present

## 2019-08-12 DIAGNOSIS — I5030 Unspecified diastolic (congestive) heart failure: Secondary | ICD-10-CM | POA: Diagnosis not present

## 2019-08-12 LAB — BASIC METABOLIC PANEL
Anion gap: 7 (ref 5–15)
BUN: 12 mg/dL (ref 8–23)
CO2: 24 mmol/L (ref 22–32)
Calcium: 10.4 mg/dL — ABNORMAL HIGH (ref 8.9–10.3)
Chloride: 108 mmol/L (ref 98–111)
Creatinine, Ser: 0.94 mg/dL (ref 0.44–1.00)
GFR calc Af Amer: >60 mL/min (ref >60)
GFR calc non Af Amer: 52 mL/min — ABNORMAL LOW (ref >60)
Glucose, Bld: 105 mg/dL — ABNORMAL HIGH (ref 70–99)
Potassium: 4.3 mmol/L (ref 3.5–5.1)
Sodium: 139 mmol/L (ref 135–145)

## 2019-08-12 LAB — CBC
HCT: 28.4 % — ABNORMAL LOW (ref 36.0–46.0)
Hemoglobin: 9 g/dL — ABNORMAL LOW (ref 12.0–15.0)
MCH: 22.6 pg — ABNORMAL LOW (ref 26.0–34.0)
MCHC: 31.7 g/dL (ref 30.0–36.0)
MCV: 71.2 fL — ABNORMAL LOW (ref 80.0–100.0)
Platelets: 162 10*3/uL (ref 150–400)
RBC: 3.99 MIL/uL (ref 3.87–5.11)
RDW: 15.8 % — ABNORMAL HIGH (ref 11.5–15.5)
WBC: 3.7 10*3/uL — ABNORMAL LOW (ref 4.0–10.5)
nRBC: 0 % (ref 0.0–0.2)

## 2019-08-12 NOTE — TOC Progression Note (Addendum)
Transition of Care [facility]) - Progression Note    Patient Details  Name: [name] MRN: [id number] Date of Birth: 07-07-1924  Transition of Care [facility]) CM/SW [place] RN, BSN, NCM-BC, [place] [phone] Phone Number: 08/12/2019, 12:24 PM  Clinical Narrative:    Patient will now require ST SNF for generalized weakness; deconditioning. CM spoke to the patients daughter/Legal guardian, [name] to discuss the POC, with the daughter agreeable to ST SNF placement. CMS SNF compare provided with [facility] selected. FL2 completed and faxed for bed offers. CM team will continue to follow.   Addendum: 08/12/19 @ 1242-Rayaan Lorah [place] offered a bed with patients daughter, [name], agreeable to the patient discharging to the facility tomorrow. CM updated Family Medicine; patient will require a signed DNR/DC summary. PTAR will be arranged.  Please call report to: [phone]; room 102a  Addendum: 08/13/19 @ 1140-Leata Dominy RNCM- CM spoke to [name] (daughter) to inform of discharge today. Daughter had concerns about possible COVID exposure at the SNF, with questions answered. CM also requested Juliann Pulse [employer]) to reach out to the daughter to answer any further concerns, with the daughter agreeable. PTAR will be arranged for 1300.   Expected Discharge Plan: Skilled Nursing Facility Barriers to Discharge: SNF Pending bed offer  Expected Discharge Plan and Services Expected Discharge Plan: [place] In-house Referral: Clinical Social Work Discharge Planning Services: CM Consult Post Acute Care Choice: [place] Living arrangements for the past 2 months: [place] Expected Discharge Date: 08/11/19               DME Arranged: N/A DME Agency: NA       HH Arranged: RN, Disease Management, PT, OT, Nurse's Aide, Social Work [employer] Agency: [place] Date [facility] Agency Contacted:  08/11/19 Time [place]: 1619 Representative spoke with at [place]: Adela Lank RN   Social Determinants of Health (SDOH) Interventions    Readmission Risk Interventions Readmission Risk Prevention Plan 08/12/2019 01/25/2019  Transportation Screening Complete -  PCP or Specialist Appt within 3-5 Days Not Complete Complete  Not Complete comments Will transition to a SNF -  [place] or [place] Complete Complete  Social Work Consult for [place] Planning/Counseling Complete -  Palliative Care Screening Not Applicable Not Applicable  Medication Review Press photographer) Complete Complete  Some recent data might be hidden

## 2019-08-12 NOTE — NC FL2 (Signed)
[place] LEVEL OF CARE SCREENING TOOL     IDENTIFICATION  [name] Birthdate: 1924/03/17 Sex: female Admission Date (Current Location): 08/10/2019  [place] and [place] Number:  Kathleen Argue [id number] [place] and Address:  [facility] [place], [place] [address], [place], [place] [address]      Provider Number: [id number]  Attending Physician Name and Address:  Leeanne Rio, MD  Relative Name and Phone Number:  [name] [phone]    Current Level of Care: Hospital Recommended Level of Care: [place] Prior Approval Number:    Date Approved/Denied:   PASRR Number: [id number]  Discharge Plan: SNF    Current Diagnoses: Patient Active Problem List   Diagnosis Date Noted  . Weakness 08/12/2019  . Constipation 01/29/2019  . Chronic breast pain 12/26/2018  . Pain in gums 12/26/2018  . History of deep vein thrombosis 10/17/2018  . Severe protein-calorie malnutrition [place] Oiler: less than 60% of standard weight) ([place]) 10/17/2018  . Disorder associated with well-controlled type 2 diabetes mellitus ([place]) 10/17/2018  . History of pulmonary embolus (PE) 10/17/2018  . Hypomagnesemia 08/27/2018  . Left ventricular hypertrophy by electrocardiogram 08/27/2018  . Atypical chest pain 08/26/2018  . Advanced nonexudative age-related macular degeneration of both eyes without subfoveal involvement 07/30/2018  . Altered mental status 06/06/2018  . Microcytic anemia   . TIA (transient ischemic attack) 06/03/2018  . Mobility impaired 04/26/2018  . Protein-calorie malnutrition, severe 02/06/2017  . Urinary tract infection without hematuria 02/03/2017  . Hypokalemia 10/13/2016  . Pressure injury of heel, stage 2 ([place]) 08/31/2016  . Pulmonary embolus ([place]) 08/30/2016  . Dementia without behavioral disturbance ([place]) 06/01/2016  . Depression 01/20/2016  . Suicidal ideation 01/19/2016  . Prolapse of female pelvic organs  12/28/2015  . Leg weakness, bilateral 12/08/2015  . Weakness of right lower extremity 11/25/2015  . Dry eyes 10/28/2015  . (HFpEF) heart failure with preserved ejection fraction ([place])   . Gout 05/14/2015  . Delirium secondary to multiple medical problems 04/15/2015  . Primary hypertension   . Seasonal allergies 02/10/2015  . Stroke-like symptoms   . Stroke-like episode ([place]) 01/25/2015  . Syncope and collapse 01/25/2015  . Dizziness 09/19/2014  . Anemia of chronic illness 08/19/2014  . Acute superficial venous thrombosis of right lower extremity 08/19/2014  . Hypercalcemia 06/09/2014  . Breast cancer, left breast ([place]) 06/09/2014  . Breast cancer, right breast ([place]) 06/09/2014  . Edema of left lower extremity 03/30/2014  . PAD (peripheral artery disease) ([place]) 03/30/2014  . History of colon cancer 12/11/2013  . History of fall 05/15/2013  . Stricture and stenosis of esophagus 10/27/2012  . Thoracic back pain 05/24/2012  . Productive cough 04/30/2012  . Tobacco use disorder 06/02/2011  . Post-mastectomy pain 05/02/2011  . Hiatal hernia 03/22/2011  . Cough 01/31/2011  . 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 IDA (iron deficiency anemia) 04/29/2007  . Controlled type 2 diabetes mellitus with diabetic neuropathy, without long-term current use of insulin ([place]) 01/10/2007  . Coronary atherosclerosis 01/10/2007  . Asthma 01/10/2007  . Reflux esophagitis 01/10/2007  . Osteoarthritis, multiple sites 01/10/2007    Orientation RESPIRATION BLADDER Height & Weight     Self, Time, Situation, Place  Normal Continent Weight:   Height:     BEHAVIORAL SYMPTOMS/MOOD NEUROLOGICAL BOWEL NUTRITION STATUS  (N/A) (N/A) Continent Diet  AMBULATORY STATUS COMMUNICATION OF NEEDS Skin   Limited Assist Verbally Other (Comment), Normal(appropriate for age)  Personal Care Assistance Level of Assistance  Bathing, Feeding, Dressing Bathing Assistance: Limited  assistance Feeding assistance: Limited assistance Dressing Assistance: Limited assistance     Functional Limitations Info  Sight, Hearing, Speech Sight Info: Impaired Hearing Info: Adequate Speech Info: Adequate(wears dentures)    SPECIAL CARE FACTORS FREQUENCY  PT (By licensed PT), OT (By licensed OT)     PT Frequency: Min 3X/week OT Frequency: Min 3X/week            Contractures Contractures Info: Not present    Additional Factors Info  Code Status, Allergies Code Status Info: DNR Allergies Info: Tape; Lisinopril           Current Medications (08/12/2019):  This is the current hospital active medication list Current Facility-Administered Medications  Medication Dose Route Frequency Provider Last Rate Last Dose  . acetaminophen (TYLENOL) tablet 650 mg  650 mg Oral Q6H PRN Lyndee Hensen, MD   650 mg at 08/11/19 2214   Or  . acetaminophen (TYLENOL) suppository 650 mg  650 mg Rectal Q6H PRN Lyndee Hensen, MD      . albuterol (PROVENTIL) (2.5 MG/3ML) 0.083% nebulizer solution 3 mL  3 mL Inhalation Q6H PRN Lyndee Hensen, MD      . amLODipine (NORVASC) tablet 5 mg  5 mg Oral Daily Lyndee Hensen, MD   5 mg at 08/12/19 0858  . cephALEXin (KEFLEX) capsule 500 mg  500 mg Oral BID Lyndee Hensen, MD   500 mg at 08/12/19 0859  . diclofenac sodium (VOLTAREN) 1 % transdermal gel 2 g  2 g Topical QID PRN Lyndee Hensen, MD      . donepezil (ARICEPT) tablet 10 mg  10 mg Oral QHS Lyndee Hensen, MD   10 mg at 08/11/19 2157  . enoxaparin (LOVENOX) injection 40 mg  40 mg Subcutaneous Q24H Lyndee Hensen, MD   40 mg at 08/12/19 0859  . famotidine (PEPCID) tablet 20 mg  20 mg Oral Daily Lyndee Hensen, MD   20 mg at 08/12/19 0858  . ferrous sulfate tablet 325 mg  325 mg Oral Q breakfast Mullis, Kiersten P, DO   325 mg at 08/12/19 0858  . fluticasone (FLONASE) 50 MCG/ACT nasal spray 1 spray  1 spray Each Nare Daily PRN Lyndee Hensen, MD      . loratadine (CLARITIN) tablet 5  mg  5 mg Oral Daily PRN Lyndee Hensen, MD      . losartan (COZAAR) tablet 50 mg  50 mg Oral QHS Lyndee Hensen, MD   50 mg at 08/11/19 2210  . pantoprazole (PROTONIX) EC tablet 40 mg  40 mg Oral Daily Lyndee Hensen, MD   40 mg at 08/12/19 0859  . polyethylene glycol (MIRALAX / GLYCOLAX) packet 17 g  17 g Oral Daily Lyndee Hensen, MD   17 g at 08/12/19 0859  . rosuvastatin (CRESTOR) tablet 20 mg  20 mg Oral QHS Lyndee Hensen, MD   20 mg at 08/11/19 2210  . traZODone (DESYREL) tablet 100 mg  100 mg Oral QHS Lyndee Hensen, MD   100 mg at 08/11/19 2210     Discharge Medications: Please see discharge summary for a list of discharge medications.  Relevant Imaging Results:  Relevant Lab Results:   Additional Information SS# [id number]; High falls risk; Legal Guardian [facility]/daughter)  Midge Minium RN, BSN, NCM-BC, ACM-RN [phone]

## 2019-08-12 NOTE — Progress Notes (Addendum)
Family Medicine Teaching Service Daily Progress Note Intern Pager: [phone]  [name] Medical record number: [id number] Date of birth: 01-17-1924 Age: 85 y.o. Gender: female  Primary Care Provider: Danna Hefty, DO Consultants: None Code Status: DNR  Pt Overview and Major Events to Date:  9/27 - Admitted to FPTS  Assessment and Plan: [name] is a 85 y.o. female presenting with generalized weakness and non-focal pain. PMH is significant for hypokalemia, dementia, IDA, HFpEF, HTN, T2DM, GERD, Malnutrition.  Weakness  Recent GI upset  Acute on Chronic Hypokalemia  Hypomagnesemia: Likely due to chronic deconditioning, UTI, anemia, and possibly hypokalemia.  Potassium has improved to 4.3 on 9/29.  Magnesium repleted on 9/28.  Due to inability to maintain safety due to weakness at home, planning to discharge patient to SNF.  Patient is medically cleared for discharge to SNF. -Social work consult for SNF placement -Daily BMP and CBC - PT/OT recs - zofran PRN for nausea - vitals per routine - regular diet    Klebsiella UTI: Diagnosed with UTI on 9/22, urine culture positive for Klebsiella. Currently on 10-day course of Keflex, although patient was only taking Keflex once per day prior to admission. Denies any urinary symptoms. - continue Keflex 500mg  BID (9/22-10/2)  Chronic Microcytic Anemia  Iron Deficiency: stable Hgb 9.7>9.0 on 9/29 (baseline 11.4), MCV 71.5. Labs on 05/2018 consistent with iron deficiency. No signs of bleeding on exam. - iron supplement - continue to monitor  HFpEF (EF 65%): chronic, stable Last echo (05/2018) notable for EF 65%, G1DD. Home meds: Losartan 50mg  QD and Carvedilol 12.5mg  BID. - continue home meds  HTN:  BP elevated on admission, moderately improved overnight to 140-160's/60's. One episode of hypotension this AM of 128/54. Unclear etiology for low blood pressure - continue to monitor  T2DM: Last A1C 6.7 (05/2018).  Diet controlled.  - daily BMP  HLD:  Home meds: Rosuvastatin 20mg  QD.  - given age and lack of benefit, consider discontinuation on discharge to minimize daily medications  GERD:  Home meds: Protonix 40mg  QD and Zantac 150mg  QD - continue home meds  Insomnia  Dementia: Home meds: Trazodone 100mg  qHS, Aricept 10mg  qHS. MoCA score in March 2020 notable for score of 5/19 (primarily due to inability to read and write). Patient is dependent on daughter [name] for ADLs/IADLs. She notes she feels like she is not getting the best care at home given 6 grandchildren in the home makes it difficult for her daughter/care giver to give her the adequate care she needs. She requests SNF if recommended by PT/OT. Patient is very cognitively with it and able to describe and explain her home settings and current medical condition.  - continue home meds - PT/OT  Moderate Protein Calorie Malnutrition: - continue Ensure between meals  Nicotine use d/o She reports a long history ofdippingsnuff. She is not interested in a nicotine patch at this time. -Continue to monitor  FEN/GI: Heart Healthy  PPx: Lovenox  Disposition: pending PT/OT recs  Subjective:  Patient reports that she feels better and has no pain this morning.  She says that her daughter [name] does not help her when she asks for it at home and has 6 grandchildren to take care of as well.  She does not want to go home with her.  Objective: Temp:  [98 F (36.7 C)-98.3 F (36.8 C)] 98 F (36.7 C) (09/29 0801) Pulse Rate:  [53-72] 53 (09/29 0801) Resp:  [14-17] 14 (09/29 0801) BP: (  127-132)/(48-60) 128/54 (09/29 0801) SpO2:  [98 %-99 %] 99 % (09/29 0801) Physical Exam: General: Pleasant, elderly appearing lady in no acute distress CV: RRR, no MRG Lungs: CTA B, comfortable work of breathing on room air Abdomen: Soft, nontender, nondistended, normal bowel sounds Extremities: warm and well perfused Neuro: Oriented to self and place but not  to time   Laboratory: Recent Labs  Lab 08/10/19 1722 08/11/19 0349 08/12/19 0504  WBC 4.3 4.2 3.7*  HGB 9.7* 9.7* 9.0*  HCT 31.0* 31.1* 28.4*  PLT 203 172 162   Recent Labs  Lab 08/10/19 1722 08/11/19 0349 08/12/19 0504  NA 141 140 139  K 2.5* 3.4* 4.3  CL 103 108 108  CO2 27 23 24   BUN 9 6* 12  CREATININE 0.78 0.85 0.94  CALCIUM 10.3 10.1 10.4*  PROT 6.8  --   --   BILITOT 0.4  --   --   ALKPHOS 60  --   --   ALT 7  --   --   AST 17  --   --   GLUCOSE 115* 80 105*    Urinalysis    Component Value Date/Time   COLORURINE YELLOW 08/10/2019 1547   APPEARANCEUR CLEAR 08/10/2019 1547   LABSPEC 1.010 08/10/2019 1547   PHURINE 7.0 08/10/2019 1547   GLUCOSEU NEGATIVE 08/10/2019 1547   HGBUR NEGATIVE 08/10/2019 1547   HGBUR negative 10/21/2009 1424   BILIRUBINUR NEGATIVE 08/10/2019 1547   BILIRUBINUR negative 04/09/2019 0940   BILIRUBINUR NEG 08/03/2015 1240   KETONESUR 5 (A) 08/10/2019 1547   PROTEINUR 30 (A) 08/10/2019 1547   UROBILINOGEN 0.2 04/09/2019 0940   UROBILINOGEN 0.2 09/16/2015 1613   NITRITE NEGATIVE 08/10/2019 1547   LEUKOCYTESUR NEGATIVE 08/10/2019 1547    Lipase 33 CK 82 FOBT negative COVID neg  Imaging/Diagnostic Tests: Dg Abd Acute W/chest  Result Date: 08/10/2019 CLINICAL DATA:  Nausea and vomiting EXAM: DG ABDOMEN ACUTE W/ 1V CHEST COMPARISON:  None. FINDINGS: Normal mediastinum and cardiac silhouette. Normal pulmonary vasculature. No evidence of effusion, infiltrate, or pneumothorax. No acute bony abnormality. No dilated large or small bowel. No intraperitoneal free air. Gas in the rectum. Surgical clips in the lower pelvis. Degenerate changes of the spine.  Osteoarthritis of the hips. IMPRESSION: 1.  No acute cardiopulmonary process. 2. No bowel obstruction. Electronically Signed   By: Suzy Bouchard M.D.   On: 08/10/2019 14:54    Kathrene Alu, MD 08/12/2019, 8:30 AM PGY-3, [place] Intern pager: [phone],  text pages welcome

## 2019-08-12 NOTE — Discharge Summary (Signed)
[facility] Discharge Summary  [name] Medical record number: [id number] Date of birth: 1924/01/09 Age: 85 y.o. Gender: female Date of Admission: 08/10/2019  Date of Discharge: 08/12/2019 Admitting Physician: Leeanne Rio, MD  Primary Care Provider: Danna Hefty, DO Consultants: None  Indication for Hospitalization: Generalized weakness, Hypokalemia  Discharge Diagnoses/Problem List:  Generalized weakness Hypokalemia Hypomagnesemia Klebsiella UTI Chronic microcytic anemia Iron deficiency anemia HFpEF (EF 65%) Hypertension Type 2 Diabetes Hyperlipidemia GERD Insomnia Dementia  Disposition: SNF  Discharge Condition: Improved  Discharge Exam:  General: pleasant thin elderly lady, resting comfortably in bed, no acute distress CV: regular rate and rhythm without murmurs, rubs, or gallops, no lower extremity edema, 2+ radial and pedal pulses bilaterally Lungs: clear to auscultation bilaterally with normal work of breathing on room air Abdomen: soft, non-tender, non-distended, normoactive bowel sounds Skin: warm, dry, erythema along upper lateral left great toe at base of nail concerning for ingrown toe nail, no increased warmth Extremities: warm and well perfused, upper lateral left great toe tender to palpation Neuro: Alert and oriented x 2 to name and place, speech soft but normal         Brief Hospital Course:  [name] is a 85 y.o. female with past medical history significant for chronic hypokalemia, dementia, Insomnia, chronic microcytic anemia 2/2 to IDA, HFpEF, HTN, T2DM, GERD, protein calorie malnutrition, who presented with generalized weakness and found to have hypokalemia of 2.5. She endorsed GI upset and one episode of emesis prior to admission. Expected generalized weakness secondary to deconditioning, recent diagnosis of UTI, poor PO intake in setting of recent GI upset and hypokalemia. Patient  received IV fluids and potassium repletion with improvement in her symptoms. PT/OT was consulted for evaluation and recommended skilled nursing for improvement in mobility and balance.  Patient presented with recent diagnosis of Klebsiella UTI. She was started on a 10-day course of Keflex 500mg  BID on 9/23. She should finish this course on 10/2.  Issues for Follow Up:  1. Please ensure patient finishes entire course of antibiotics for her UTI 2. Recommend weekly to every other week BMP's to monitor potassium and replete if necessary 3. All three blood pressures medicines held due to softer blood pressures during admission. Please add back as indicated. 4. Left lateral great toe concerning for ingrown toenail. (See pictures in media) Please ensure patient attends podiatry appointment scheduled for 10/1 at 9:45 am  Significant Procedures: None  Significant Labs and Imaging:  Recent Labs  Lab 08/10/19 1722 08/11/19 0349 08/12/19 0504  WBC 4.3 4.2 3.7*  HGB 9.7* 9.7* 9.0*  HCT 31.0* 31.1* 28.4*  PLT 203 172 162   Recent Labs  Lab 08/10/19 1722 08/11/19 0349 08/12/19 0504  NA 141 140 139  K 2.5* 3.4* 4.3  CL 103 108 108  CO2 27 23 24   GLUCOSE 115* 80 105*  BUN 9 6* 12  CREATININE 0.78 0.85 0.94  CALCIUM 10.3 10.1 10.4*  MG  --  1.7  --   ALKPHOS 60  --   --   AST 17  --   --   ALT 7  --   --   ALBUMIN 3.1*  --   --     Lipase 33 CK 82 FOBT negative COVID neg  Results/Tests Pending at Time of Discharge: None  Discharge Medications:  Allergies as of 08/13/2019      Reactions   Tape Other (See Comments)   Skin is  very thin and will tear and bruise easily!!   Lisinopril Cough      Medication List    STOP taking these medications   amLODipine 5 MG tablet Commonly known as: NORVASC   carvedilol 12.5 MG tablet Commonly known as: COREG   HYDROcodone-acetaminophen 5-325 MG tablet Commonly known as: NORCO/VICODIN   losartan 50 MG tablet Commonly known as:  COZAAR   ranitidine 150 MG tablet Commonly known as: ZANTAC     TAKE these medications   acetaminophen 500 MG tablet Commonly known as: TYLENOL Take 500 mg by mouth every 6 (six) hours as needed for moderate pain.   albuterol 108 (90 Base) MCG/ACT inhaler Commonly known as: VENTOLIN HFA Inhale 2 puffs into the lungs every 6 (six) hours as needed. For shortness of breath   aspirin EC 325 MG tablet Take 1 tablet (325 mg total) by mouth daily.   cephALEXin 500 MG capsule Commonly known as: KEFLEX Take 1 capsule (500 mg total) by mouth 2 (two) times daily for 10 days.   diclofenac sodium 1 % Gel Commonly known as: VOLTAREN Apply 2 g topically 4 (four) times daily.   donepezil 10 MG tablet Commonly known as: Aricept Take 1 tablet (10 mg total) by mouth at bedtime.   feeding supplement (PRO-STAT SUGAR FREE 64) Liqd Take 30 mLs by mouth 2 (two) times daily with a meal. ensure   ferrous sulfate 325 (65 FE) MG tablet Take 1 tablet (325 mg total) by mouth daily with breakfast.   fluticasone 50 MCG/ACT nasal spray Commonly known as: FLONASE PLACE 1 SPRAY INTO BOTH NOSTRILS DAILY. What changed:   when to take this  reasons to take this   loratadine 10 MG tablet Commonly known as: CLARITIN Take 0.5 tablets (5 mg total) by mouth daily as needed for allergies or rhinitis.   pantoprazole 40 MG tablet Commonly known as: PROTONIX Take 1 tablet (40 mg total) by mouth daily.   polyethylene glycol 17 g packet Commonly known as: MIRALAX / GLYCOLAX Take 17 g by mouth daily.   potassium chloride SA 20 MEQ tablet Commonly known as: KLOR-CON Take 1 tablet (20 mEq total) by mouth 2 (two) times daily for 7 days.   rosuvastatin 20 MG tablet Commonly known as: CRESTOR TAKE 1 TABLET BY MOUTH EVERYDAY AT BEDTIME What changed: See the new instructions.   senna-docusate 8.6-50 MG tablet Commonly known as: Senokot-S Take 2 tablets by mouth at bedtime. What changed:   how much to  take  when to take this  reasons to take this   traZODone 100 MG tablet Commonly known as: DESYREL TAKE 1 TABLET (100 MG TOTAL) BY MOUTH AT BEDTIME AS NEEDED FOR SLEEP. What changed: when to take this       Discharge Instructions: Please refer to Patient Instructions section of EMR for full details.  Patient was counseled important signs and symptoms that should prompt return to medical care, changes in medications, dietary instructions, activity restrictions, and follow up appointments.   Follow-Up Appointments: Follow-up [facility] Follow up.   Why: Community Case Management Contact information: [place] [place], [place] Follow up.   Specialty: [place] Why: rehab Contact information: [address] [place] [address] [place]. Go on 08/15/2019.   Why: at 1:30pm for hospital follow up.  Contact information: [place] [id number]       Wallene Huh, DPM. Go on 08/14/2019.   Specialty: Podiatry Why: at 9:45 am for evaluation of right great tow Contact information: [place] [address] [place], [place], DO 08/13/2019, 10:42 AM PGY-2, [place]

## 2019-08-13 DIAGNOSIS — R2689 Other abnormalities of gait and mobility: Secondary | ICD-10-CM | POA: Diagnosis not present

## 2019-08-13 DIAGNOSIS — R262 Difficulty in walking, not elsewhere classified: Secondary | ICD-10-CM | POA: Diagnosis not present

## 2019-08-13 DIAGNOSIS — D509 Iron deficiency anemia, unspecified: Secondary | ICD-10-CM | POA: Diagnosis not present

## 2019-08-13 DIAGNOSIS — K449 Diaphragmatic hernia without obstruction or gangrene: Secondary | ICD-10-CM | POA: Diagnosis not present

## 2019-08-13 DIAGNOSIS — Z720 Tobacco use: Secondary | ICD-10-CM | POA: Diagnosis not present

## 2019-08-13 DIAGNOSIS — R079 Chest pain, unspecified: Secondary | ICD-10-CM | POA: Diagnosis not present

## 2019-08-13 DIAGNOSIS — E119 Type 2 diabetes mellitus without complications: Secondary | ICD-10-CM | POA: Diagnosis not present

## 2019-08-13 DIAGNOSIS — E43 Unspecified severe protein-calorie malnutrition: Secondary | ICD-10-CM | POA: Diagnosis not present

## 2019-08-13 DIAGNOSIS — R627 Adult failure to thrive: Secondary | ICD-10-CM | POA: Diagnosis not present

## 2019-08-13 DIAGNOSIS — E876 Hypokalemia: Secondary | ICD-10-CM | POA: Diagnosis not present

## 2019-08-13 DIAGNOSIS — I517 Cardiomegaly: Secondary | ICD-10-CM | POA: Diagnosis not present

## 2019-08-13 DIAGNOSIS — K210 Gastro-esophageal reflux disease with esophagitis: Secondary | ICD-10-CM | POA: Diagnosis not present

## 2019-08-13 DIAGNOSIS — K5900 Constipation, unspecified: Secondary | ICD-10-CM | POA: Diagnosis not present

## 2019-08-13 DIAGNOSIS — N390 Urinary tract infection, site not specified: Secondary | ICD-10-CM | POA: Diagnosis not present

## 2019-08-13 DIAGNOSIS — R531 Weakness: Secondary | ICD-10-CM | POA: Diagnosis not present

## 2019-08-13 DIAGNOSIS — I10 Essential (primary) hypertension: Secondary | ICD-10-CM | POA: Diagnosis not present

## 2019-08-13 DIAGNOSIS — F0390 Unspecified dementia without behavioral disturbance: Secondary | ICD-10-CM | POA: Diagnosis not present

## 2019-08-13 DIAGNOSIS — E785 Hyperlipidemia, unspecified: Secondary | ICD-10-CM | POA: Diagnosis not present

## 2019-08-13 DIAGNOSIS — F339 Major depressive disorder, recurrent, unspecified: Secondary | ICD-10-CM | POA: Diagnosis not present

## 2019-08-13 DIAGNOSIS — N819 Female genital prolapse, unspecified: Secondary | ICD-10-CM | POA: Diagnosis not present

## 2019-08-13 DIAGNOSIS — R279 Unspecified lack of coordination: Secondary | ICD-10-CM | POA: Diagnosis not present

## 2019-08-13 DIAGNOSIS — G459 Transient cerebral ischemic attack, unspecified: Secondary | ICD-10-CM | POA: Diagnosis not present

## 2019-08-13 DIAGNOSIS — G4700 Insomnia, unspecified: Secondary | ICD-10-CM | POA: Diagnosis not present

## 2019-08-13 DIAGNOSIS — C50912 Malignant neoplasm of unspecified site of left female breast: Secondary | ICD-10-CM | POA: Diagnosis not present

## 2019-08-13 DIAGNOSIS — M109 Gout, unspecified: Secondary | ICD-10-CM | POA: Diagnosis not present

## 2019-08-13 DIAGNOSIS — M159 Polyosteoarthritis, unspecified: Secondary | ICD-10-CM | POA: Diagnosis not present

## 2019-08-13 DIAGNOSIS — K219 Gastro-esophageal reflux disease without esophagitis: Secondary | ICD-10-CM | POA: Diagnosis not present

## 2019-08-13 DIAGNOSIS — J45998 Other asthma: Secondary | ICD-10-CM | POA: Diagnosis not present

## 2019-08-13 DIAGNOSIS — R0989 Other specified symptoms and signs involving the circulatory and respiratory systems: Secondary | ICD-10-CM | POA: Diagnosis not present

## 2019-08-13 DIAGNOSIS — I739 Peripheral vascular disease, unspecified: Secondary | ICD-10-CM | POA: Diagnosis not present

## 2019-08-13 DIAGNOSIS — M6281 Muscle weakness (generalized): Secondary | ICD-10-CM | POA: Diagnosis not present

## 2019-08-13 DIAGNOSIS — C50911 Malignant neoplasm of unspecified site of right female breast: Secondary | ICD-10-CM | POA: Diagnosis not present

## 2019-08-13 DIAGNOSIS — I2510 Atherosclerotic heart disease of native coronary artery without angina pectoris: Secondary | ICD-10-CM | POA: Diagnosis not present

## 2019-08-13 DIAGNOSIS — E1141 Type 2 diabetes mellitus with diabetic mononeuropathy: Secondary | ICD-10-CM | POA: Diagnosis not present

## 2019-08-13 DIAGNOSIS — R52 Pain, unspecified: Secondary | ICD-10-CM | POA: Diagnosis not present

## 2019-08-13 DIAGNOSIS — H04129 Dry eye syndrome of unspecified lacrimal gland: Secondary | ICD-10-CM | POA: Diagnosis not present

## 2019-08-13 DIAGNOSIS — Z743 Need for continuous supervision: Secondary | ICD-10-CM | POA: Diagnosis not present

## 2019-08-13 DIAGNOSIS — I5030 Unspecified diastolic (congestive) heart failure: Secondary | ICD-10-CM | POA: Diagnosis not present

## 2019-08-13 NOTE — Progress Notes (Signed)
Report given to [place] @ [facility].

## 2019-08-14 ENCOUNTER — Encounter

## 2019-08-14 ENCOUNTER — Ambulatory Visit: Payer: Medicare Other | Admitting: Podiatry

## 2019-08-14 ENCOUNTER — Ambulatory Visit: Payer: Medicare Other

## 2019-08-14 ENCOUNTER — Other Ambulatory Visit: Payer: Self-pay | Admitting: *Deleted

## 2019-08-14 DIAGNOSIS — I10 Essential (primary) hypertension: Secondary | ICD-10-CM | POA: Diagnosis not present

## 2019-08-14 DIAGNOSIS — E119 Type 2 diabetes mellitus without complications: Secondary | ICD-10-CM | POA: Diagnosis not present

## 2019-08-14 DIAGNOSIS — I2510 Atherosclerotic heart disease of native coronary artery without angina pectoris: Secondary | ICD-10-CM | POA: Diagnosis not present

## 2019-08-14 DIAGNOSIS — R262 Difficulty in walking, not elsewhere classified: Secondary | ICD-10-CM | POA: Diagnosis not present

## 2019-08-14 NOTE — Patient Outreach (Signed)
Member assessed for potential [facility] Care Management needs as a benefit of  [place] [employer].  Member is currently receiving rehab therapy at [facility].  Member discussed in weekly telephonic IDT meeting with facility staff, [facility] UM team, and writer.  Facility staff confirmed member has admitted to their facility. Writer will follow for disposition plans and progression. [name] will be followed by [facility] RN CM upon SNF discharge.   Will keep [facility] CM updated and will collaborate with [facility] UM team and facility on member.   [name] Rolling, MSN-Ed, RN,BSN [place] Acute Care Coordinator [phone] [facility]) [phone]  (Toll free office)

## 2019-08-14 NOTE — Patient Outreach (Signed)
[name]) Care Management  08/14/2019  [name] 1923-12-05 [id number]   New referral received as member was admitted to hospital on 9/27 for hypokalemia and weakness.  Notified of discharge to [facility] yesterday.  This care manager made post acute care coordinator aware of discharge, will follow up with member/family after discharge from SNF.  [name], [place], MSN [place] [phone]

## 2019-08-15 ENCOUNTER — Ambulatory Visit: Payer: Medicare Other

## 2019-08-20 DIAGNOSIS — F0390 Unspecified dementia without behavioral disturbance: Secondary | ICD-10-CM | POA: Diagnosis not present

## 2019-08-20 DIAGNOSIS — R0989 Other specified symptoms and signs involving the circulatory and respiratory systems: Secondary | ICD-10-CM | POA: Diagnosis not present

## 2019-08-20 DIAGNOSIS — R627 Adult failure to thrive: Secondary | ICD-10-CM | POA: Diagnosis not present

## 2019-08-20 DIAGNOSIS — Z720 Tobacco use: Secondary | ICD-10-CM | POA: Diagnosis not present

## 2019-08-20 DIAGNOSIS — M6281 Muscle weakness (generalized): Secondary | ICD-10-CM | POA: Diagnosis not present

## 2019-08-21 ENCOUNTER — Encounter (HOSPITAL_BASED_OUTPATIENT_CLINIC_OR_DEPARTMENT_OTHER): Payer: Self-pay | Admitting: *Deleted

## 2019-08-21 ENCOUNTER — Emergency Department (HOSPITAL_BASED_OUTPATIENT_CLINIC_OR_DEPARTMENT_OTHER): Payer: Medicare Other

## 2019-08-21 ENCOUNTER — Inpatient Hospital Stay (HOSPITAL_BASED_OUTPATIENT_CLINIC_OR_DEPARTMENT_OTHER)
Admission: EM | Admit: 2019-08-21 | Discharge: 2019-08-24 | DRG: 812 | Disposition: A | Discharge status: Skilled Nursing Facility | Payer: Medicare Other | Source: Skilled Nursing Facility | Attending: Family Medicine | Admitting: Family Medicine

## 2019-08-21 ENCOUNTER — Other Ambulatory Visit: Payer: Self-pay

## 2019-08-21 DIAGNOSIS — I5042 Chronic combined systolic (congestive) and diastolic (congestive) heart failure: Secondary | ICD-10-CM | POA: Diagnosis present

## 2019-08-21 DIAGNOSIS — E876 Hypokalemia: Secondary | ICD-10-CM | POA: Diagnosis not present

## 2019-08-21 DIAGNOSIS — Z23 Encounter for immunization: Secondary | ICD-10-CM | POA: Diagnosis not present

## 2019-08-21 DIAGNOSIS — Z853 Personal history of malignant neoplasm of breast: Secondary | ICD-10-CM

## 2019-08-21 DIAGNOSIS — E1140 Type 2 diabetes mellitus with diabetic neuropathy, unspecified: Secondary | ICD-10-CM | POA: Diagnosis present

## 2019-08-21 DIAGNOSIS — K922 Gastrointestinal hemorrhage, unspecified: Secondary | ICD-10-CM

## 2019-08-21 DIAGNOSIS — Z66 Do not resuscitate: Secondary | ICD-10-CM | POA: Diagnosis present

## 2019-08-21 DIAGNOSIS — I110 Hypertensive heart disease with heart failure: Secondary | ICD-10-CM | POA: Diagnosis present

## 2019-08-21 DIAGNOSIS — W07XXXA Fall from chair, initial encounter: Secondary | ICD-10-CM | POA: Diagnosis present

## 2019-08-21 DIAGNOSIS — Z9861 Coronary angioplasty status: Secondary | ICD-10-CM

## 2019-08-21 DIAGNOSIS — Z85038 Personal history of other malignant neoplasm of large intestine: Secondary | ICD-10-CM | POA: Diagnosis not present

## 2019-08-21 DIAGNOSIS — Z20828 Contact with and (suspected) exposure to other viral communicable diseases: Secondary | ICD-10-CM | POA: Diagnosis present

## 2019-08-21 DIAGNOSIS — R296 Repeated falls: Secondary | ICD-10-CM | POA: Diagnosis present

## 2019-08-21 DIAGNOSIS — R000 Tachycardia, unspecified: Secondary | ICD-10-CM | POA: Diagnosis not present

## 2019-08-21 DIAGNOSIS — I252 Old myocardial infarction: Secondary | ICD-10-CM

## 2019-08-21 DIAGNOSIS — Y92129 Unspecified place in nursing home as the place of occurrence of the external cause: Secondary | ICD-10-CM

## 2019-08-21 DIAGNOSIS — R64 Cachexia: Secondary | ICD-10-CM | POA: Diagnosis not present

## 2019-08-21 DIAGNOSIS — S0181XA Laceration without foreign body of other part of head, initial encounter: Secondary | ICD-10-CM | POA: Diagnosis not present

## 2019-08-21 DIAGNOSIS — M109 Gout, unspecified: Secondary | ICD-10-CM | POA: Diagnosis present

## 2019-08-21 DIAGNOSIS — N179 Acute kidney failure, unspecified: Secondary | ICD-10-CM | POA: Diagnosis not present

## 2019-08-21 DIAGNOSIS — S199XXA Unspecified injury of neck, initial encounter: Secondary | ICD-10-CM | POA: Diagnosis not present

## 2019-08-21 DIAGNOSIS — E1165 Type 2 diabetes mellitus with hyperglycemia: Secondary | ICD-10-CM | POA: Diagnosis not present

## 2019-08-21 DIAGNOSIS — Z79899 Other long term (current) drug therapy: Secondary | ICD-10-CM

## 2019-08-21 DIAGNOSIS — M6281 Muscle weakness (generalized): Secondary | ICD-10-CM | POA: Diagnosis not present

## 2019-08-21 DIAGNOSIS — D62 Acute posthemorrhagic anemia: Principal | ICD-10-CM | POA: Diagnosis present

## 2019-08-21 DIAGNOSIS — R627 Adult failure to thrive: Secondary | ICD-10-CM | POA: Diagnosis not present

## 2019-08-21 DIAGNOSIS — Z91048 Other nonmedicinal substance allergy status: Secondary | ICD-10-CM

## 2019-08-21 DIAGNOSIS — D649 Anemia, unspecified: Secondary | ICD-10-CM | POA: Diagnosis not present

## 2019-08-21 DIAGNOSIS — E46 Unspecified protein-calorie malnutrition: Secondary | ICD-10-CM | POA: Diagnosis not present

## 2019-08-21 DIAGNOSIS — I959 Hypotension, unspecified: Secondary | ICD-10-CM | POA: Diagnosis not present

## 2019-08-21 DIAGNOSIS — W19XXXA Unspecified fall, initial encounter: Secondary | ICD-10-CM | POA: Diagnosis not present

## 2019-08-21 DIAGNOSIS — S299XXA Unspecified injury of thorax, initial encounter: Secondary | ICD-10-CM | POA: Diagnosis not present

## 2019-08-21 DIAGNOSIS — Z888 Allergy status to other drugs, medicaments and biological substances status: Secondary | ICD-10-CM

## 2019-08-21 DIAGNOSIS — K449 Diaphragmatic hernia without obstruction or gangrene: Secondary | ICD-10-CM | POA: Diagnosis present

## 2019-08-21 DIAGNOSIS — E785 Hyperlipidemia, unspecified: Secondary | ICD-10-CM | POA: Diagnosis present

## 2019-08-21 DIAGNOSIS — K921 Melena: Secondary | ICD-10-CM | POA: Diagnosis present

## 2019-08-21 DIAGNOSIS — I2510 Atherosclerotic heart disease of native coronary artery without angina pectoris: Secondary | ICD-10-CM | POA: Diagnosis not present

## 2019-08-21 DIAGNOSIS — Z9013 Acquired absence of bilateral breasts and nipples: Secondary | ICD-10-CM | POA: Diagnosis not present

## 2019-08-21 DIAGNOSIS — J45909 Unspecified asthma, uncomplicated: Secondary | ICD-10-CM | POA: Diagnosis present

## 2019-08-21 DIAGNOSIS — Z681 Body mass index (BMI) 19 or less, adult: Secondary | ICD-10-CM

## 2019-08-21 DIAGNOSIS — Z8249 Family history of ischemic heart disease and other diseases of the circulatory system: Secondary | ICD-10-CM

## 2019-08-21 DIAGNOSIS — K219 Gastro-esophageal reflux disease without esophagitis: Secondary | ICD-10-CM | POA: Diagnosis present

## 2019-08-21 DIAGNOSIS — Z86718 Personal history of other venous thrombosis and embolism: Secondary | ICD-10-CM

## 2019-08-21 DIAGNOSIS — Z86711 Personal history of pulmonary embolism: Secondary | ICD-10-CM

## 2019-08-21 DIAGNOSIS — R569 Unspecified convulsions: Secondary | ICD-10-CM

## 2019-08-21 DIAGNOSIS — Z833 Family history of diabetes mellitus: Secondary | ICD-10-CM

## 2019-08-21 DIAGNOSIS — F0390 Unspecified dementia without behavioral disturbance: Secondary | ICD-10-CM | POA: Diagnosis present

## 2019-08-21 DIAGNOSIS — Z7982 Long term (current) use of aspirin: Secondary | ICD-10-CM

## 2019-08-21 DIAGNOSIS — Z96651 Presence of right artificial knee joint: Secondary | ICD-10-CM | POA: Diagnosis present

## 2019-08-21 DIAGNOSIS — I248 Other forms of acute ischemic heart disease: Secondary | ICD-10-CM | POA: Diagnosis present

## 2019-08-21 DIAGNOSIS — Z8673 Personal history of transient ischemic attack (TIA), and cerebral infarction without residual deficits: Secondary | ICD-10-CM

## 2019-08-21 DIAGNOSIS — R404 Transient alteration of awareness: Secondary | ICD-10-CM | POA: Diagnosis not present

## 2019-08-21 DIAGNOSIS — G4700 Insomnia, unspecified: Secondary | ICD-10-CM | POA: Diagnosis present

## 2019-08-21 DIAGNOSIS — S0990XA Unspecified injury of head, initial encounter: Secondary | ICD-10-CM | POA: Diagnosis not present

## 2019-08-21 DIAGNOSIS — Z832 Family history of diseases of the blood and blood-forming organs and certain disorders involving the immune mechanism: Secondary | ICD-10-CM

## 2019-08-21 DIAGNOSIS — F0391 Unspecified dementia with behavioral disturbance: Secondary | ICD-10-CM | POA: Diagnosis not present

## 2019-08-21 LAB — CBC WITH DIFFERENTIAL/PLATELET
Abs Immature Granulocytes: 0.02 10*3/uL (ref 0.00–0.07)
Basophils Absolute: 0 10*3/uL (ref 0.0–0.1)
Basophils Relative: 0 %
Eosinophils Absolute: 0 10*3/uL (ref 0.0–0.5)
Eosinophils Relative: 0 %
HCT: 18.2 % — ABNORMAL LOW (ref 36.0–46.0)
Hemoglobin: 5.4 g/dL — CL (ref 12.0–15.0)
Immature Granulocytes: 0 %
Lymphocytes Relative: 15 %
Lymphs Abs: 1.1 10*3/uL (ref 0.7–4.0)
MCH: 21.9 pg — ABNORMAL LOW (ref 26.0–34.0)
MCHC: 29.7 g/dL — ABNORMAL LOW (ref 30.0–36.0)
MCV: 73.7 fL — ABNORMAL LOW (ref 80.0–100.0)
Monocytes Absolute: 0.6 10*3/uL (ref 0.1–1.0)
Monocytes Relative: 8 %
Neutro Abs: 5.6 10*3/uL (ref 1.7–7.7)
Neutrophils Relative %: 77 %
Platelets: 217 10*3/uL (ref 150–400)
RBC: 2.47 MIL/uL — ABNORMAL LOW (ref 3.87–5.11)
RDW: 17.1 % — ABNORMAL HIGH (ref 11.5–15.5)
WBC: 7.2 10*3/uL (ref 4.0–10.5)
nRBC: 0 % (ref 0.0–0.2)

## 2019-08-21 LAB — COMPREHENSIVE METABOLIC PANEL
ALT: 6 U/L (ref 0–44)
AST: 15 U/L (ref 15–41)
Albumin: 3 g/dL — ABNORMAL LOW (ref 3.5–5.0)
Alkaline Phosphatase: 47 U/L (ref 38–126)
Anion gap: 7 (ref 5–15)
BUN: 78 mg/dL — ABNORMAL HIGH (ref 8–23)
CO2: 18 mmol/L — ABNORMAL LOW (ref 22–32)
Calcium: 11.3 mg/dL — ABNORMAL HIGH (ref 8.9–10.3)
Chloride: 110 mmol/L (ref 98–111)
Creatinine, Ser: 1.53 mg/dL — ABNORMAL HIGH (ref 0.44–1.00)
GFR calc Af Amer: 34 mL/min — ABNORMAL LOW (ref >60)
GFR calc non Af Amer: 29 mL/min — ABNORMAL LOW (ref >60)
Glucose, Bld: 182 mg/dL — ABNORMAL HIGH (ref 70–99)
Potassium: 4.9 mmol/L (ref 3.5–5.1)
Sodium: 135 mmol/L (ref 135–145)
Total Bilirubin: 0.3 mg/dL (ref 0.3–1.2)
Total Protein: 6.5 g/dL (ref 6.5–8.1)

## 2019-08-21 LAB — OCCULT BLOOD X 1 CARD TO LAB, STOOL: Fecal Occult Bld: POSITIVE — AB

## 2019-08-21 LAB — URINALYSIS, ROUTINE W REFLEX MICROSCOPIC
Bilirubin Urine: NEGATIVE
Glucose, UA: NEGATIVE mg/dL
Hgb urine dipstick: NEGATIVE
Ketones, ur: NEGATIVE mg/dL
Nitrite: NEGATIVE
Protein, ur: NEGATIVE mg/dL
Specific Gravity, Urine: 1.02 (ref 1.005–1.030)
pH: 5.5 (ref 5.0–8.0)

## 2019-08-21 LAB — TROPONIN I (HIGH SENSITIVITY)
Troponin I (High Sensitivity): 35 ng/L — ABNORMAL HIGH (ref <18)
Troponin I (High Sensitivity): 38 ng/L — ABNORMAL HIGH (ref <18)

## 2019-08-21 LAB — URINALYSIS, MICROSCOPIC (REFLEX): RBC / HPF: NONE SEEN RBC/hpf (ref 0–5)

## 2019-08-21 LAB — SARS CORONAVIRUS 2 BY RT PCR (HOSPITAL ORDER, PERFORMED IN ~~LOC~~ HOSPITAL LAB): SARS Coronavirus 2: NEGATIVE

## 2019-08-21 LAB — PREPARE RBC (CROSSMATCH)

## 2019-08-21 MED ORDER — SODIUM CHLORIDE 0.9 % IV BOLUS
1000.0000 mL | Freq: Once | INTRAVENOUS | Status: AC
  Administered 2019-08-21: 1000 mL via INTRAVENOUS

## 2019-08-21 MED ORDER — PANTOPRAZOLE SODIUM 40 MG IV SOLR: 40.0000 mg | Freq: Once | INTRAVENOUS | Status: DC

## 2019-08-21 MED ORDER — PANTOPRAZOLE SODIUM 40 MG IV SOLR
40.0000 mg | Freq: Once | INTRAVENOUS | Status: AC
  Administered 2019-08-21: 18:00:00 40 mg via INTRAVENOUS
  Filled 2019-08-21: qty 40

## 2019-08-21 MED ORDER — TETANUS-DIPHTH-ACELL PERTUSSIS 5-2.5-18.5 LF-MCG/0.5 IM SUSP
0.5000 mL | Freq: Once | INTRAMUSCULAR | Status: AC
  Administered 2019-08-21: 18:00:00 0.5 mL via INTRAMUSCULAR
  Filled 2019-08-21: qty 0.5

## 2019-08-21 MED ORDER — SODIUM CHLORIDE 0.9 % IV SOLN
INTRAVENOUS | Status: DC
  Administered 2019-08-22 – 2019-08-23 (×2): via INTRAVENOUS

## 2019-08-21 MED ORDER — PANTOPRAZOLE SODIUM 40 MG IV SOLR: 40.0000 mg | Freq: Two times a day (BID) | INTRAVENOUS | Status: DC

## 2019-08-21 MED ORDER — SODIUM CHLORIDE 0.9 % IV SOLN
8.0000 mg/h | INTRAVENOUS | Status: DC
  Administered 2019-08-21 – 2019-08-23 (×4): 8 mg/h via INTRAVENOUS
  Filled 2019-08-21 (×8): qty 80

## 2019-08-21 MED ORDER — ALBUTEROL SULFATE HFA 108 (90 BASE) MCG/ACT IN AERS
2.0000 | INHALATION_SPRAY | Freq: Four times a day (QID) | RESPIRATORY_TRACT | Status: DC | PRN
  Filled 2019-08-21: qty 6.7

## 2019-08-21 MED ORDER — SODIUM CHLORIDE 0.9 % IV SOLN: 80.0000 mg | Freq: Once | INTRAVENOUS | Status: DC

## 2019-08-21 MED ORDER — LIDOCAINE HCL 2 % IJ SOLN
10.0000 mL | Freq: Once | INTRAMUSCULAR | Status: AC
  Administered 2019-08-21: 200 mg via INTRADERMAL
  Filled 2019-08-21: qty 20

## 2019-08-21 MED ORDER — DONEPEZIL HCL 10 MG PO TABS
10.0000 mg | ORAL_TABLET | Freq: Every day | ORAL | Status: DC
  Administered 2019-08-21 – 2019-08-23 (×3): 10 mg via ORAL
  Filled 2019-08-21 (×2): qty 1
  Filled 2019-08-21: qty 2

## 2019-08-21 MED ORDER — SODIUM CHLORIDE 0.9% IV SOLUTION
Freq: Once | INTRAVENOUS | Status: AC
  Administered 2019-08-21: 22:00:00 via INTRAVENOUS

## 2019-08-21 MED ORDER — ROSUVASTATIN CALCIUM 5 MG PO TABS
10.0000 mg | ORAL_TABLET | Freq: Every day | ORAL | Status: DC
  Administered 2019-08-22 – 2019-08-23 (×2): 10 mg via ORAL
  Filled 2019-08-21 (×2): qty 2

## 2019-08-21 NOTE — ED Notes (Signed)
Patient transported to X-ray 

## 2019-08-21 NOTE — ED Notes (Signed)
Pt here from [facility]. Pt fall and low hmg at [facility].

## 2019-08-21 NOTE — ED Notes (Signed)
Pt's daughter left. Will contact when pt has bed available.

## 2019-08-21 NOTE — ED Notes (Signed)
Report to carelink. ETA 15 minutes  

## 2019-08-21 NOTE — ED Notes (Signed)
RN attempting for [place] IV.

## 2019-08-21 NOTE — ED Notes (Signed)
Pt to be transferred to [facility] ED.

## 2019-08-21 NOTE — ED Notes (Signed)
Daughter [name] informed by phone of patient being transported to [facility] ED within the hour.

## 2019-08-21 NOTE — ED Provider Notes (Signed)
Pt transferred from [facility] for admission for GI bleed.  The pt's hgb is 5.4 down from 9.0 on 9/29.  She is guaiac + from below.  Protonix bolus has been given.  I ordered a drip.  I ordered 2 units for transfusion.  I spoke to pt's daughter [name]) who did give consent to give blood.  Ms. Aretha Parrot does want to hold off on GI procedures for now.  Pt has seen Dr. Henrene Pastor [facility]) in the past.  Pt d/w FP resident who will see pt for admission.  CRITICAL CARE Performed by: Isla Pence   Total critical care time: 30 minutes  Critical care time was exclusive of separately billable procedures and treating other patients.  Critical care was necessary to treat or prevent imminent or life-threatening deterioration.  Critical care was time spent personally by me on the following activities: development of treatment plan with patient and/or surrogate as well as nursing, discussions with consultants, evaluation of patient's response to treatment, examination of patient, obtaining history from patient or surrogate, ordering and performing treatments and interventions, ordering and review of laboratory studies, ordering and review of radiographic studies, pulse oximetry and re-evaluation of patient's condition.   Isla Pence, MD 08/21/19 2052

## 2019-08-21 NOTE — H&P (Addendum)
[facility] Admission History and Physical Service Pager: [phone]  [name] Medical record number: [id number] Date of birth: December 31, 1923 Age: 85 y.o. Gender: female  Primary Care Provider: Danna Hefty, DO Consultants: None Code Status: DNR (confirmed by daughter who is HPOA) Preferred Emergency Contact: [name] (daughter/HCPOA) [phone]  Chief Complaint: severe anemia s/p two falls   Assessment and Plan: [name] is a 85 y.o. female presenting with anemia after 2 falls one in which she sustained a head laceration that has been repaired. PMH is significant for anemia, breast CA s/p bilat mastectomy, CAD, hx carotid artery aneurysm, CHF, colon CA, DM, GERD, HLD, HTN.  Severe Anemia 2/2 to GI Bleed Hx of IDA  Patient initially presented to [place] after fall x2 at nursing facility.  Unclear etiology of falls but possibly secondary to anemia.  Patient found to have Hgb 5.4, repeat 5.8 on presentation to the emergency department.  With discussion with patient's daughter, she was unaware of any acute bleeding at the facility.  States that she just fell so they evaluated her after she hit her head.  Vital signs on presentation showing pulse of 100 as well as blood pressure of 139/50.  Blood pressure decreased down to 91/54 with some tachycardia to 107 upon transfer to [facility] emergency department.  Patient states she is otherwise feeling well.  2 units packed red blood cells ordered in the emergency department as well as IV Protonix.  Patient's daughter states that with discussion with her siblings and mother in the past patient does not want very aggressive measures.  Would like blood transfusions but would not like to call GI at this time as they are not interested in any scope. Patient noted to have dementia so history given is unreliable. Patient denies any SOB, palpitations, lightheadedness, dizziness and  abdominal pain. Patient was initially evaluated at outside facility with no available blood products so patient was transferred to [facility] ED for evaluation and transfusion of pRBCs to improve hemoglobin. Patient also noted to have +FOBT. Troponins elevated at 35>38 on repeat. Patient on ferrous sulfate 325 for IDA. Patient on home ASA, but no other anticoagulation. From chart review patient's transfusion threshold is <7, written in \\notes  from previous admissions. H/o chronic microcytic iron deficiency anemia on home iron.  -admit to medical telemetry, attending Dr.McIntyre -cardiac monitoring  -type and screen  -plan to transfuse 2 units of pRBCs, will re-evaluate for further transfusions after post transfusion H/H  -f/u post H/H -AM CBC, BMP -trend troponins -IV Protonix 80mg  loading followed by protonix gtt x72 hrs, then 40mg  bid  -vitals q 4 hours  -PT/OT  -2 large bore IVs -vitals q4h -NS @75cc /h -SCDs  Recent Fall  Patient reported to have fallen from her wheel chair twice today. Patient sustained a left forehead laceration that has been repaired. Head CT completed and showed no intracranial abnormalities.  -neuro checks q4h -PT/OT -fall precautions  -of note patient's daughter would like patient dc home. Daughter reports patient will have 24hr supervision at home  AKI Cr 1.53, bL appears to be less than 1. Most likely pre-renal cause given patient's + FOBT in setting of anemia and tachycardia. Will give maintenance IV fluids.  -IVF @ 37mL/hr -AM BMP  Dementia  Patient noted to have history of dementia per daughter. MoCA exam in March 2020 with a score of 5/19 (primarily due to inability to read and write). Patient baseline  is orientation to self and situation but gets confused about dates, currently at [facility]. Home medication includes Aricept 10 mg at bedtime.  -continue Aricept 10mg  at night  -neuro checks   T2DM  Diet controlled. Last A1C 6.7 (05/2018). - daily BMP  Asthma  Home  med includes albuterol.  -continue albuterol PRN   Tobacco Use Disorder  Patient reported to have last used dip tobacco 1 month ago. -encourage cessation  HTN  Patient noted to be hypotensive in setting of likely GI bleed. Home antihypertensives include coreg, amlodipine 5mg , Cozaar 50mg , losartan 50mg  -holding antihypertension, add back as BP tolerates. Would add BB back first  HFpEF Last echo 05/2018 60-65% with moderate septal hypertrophy, moderate aortic valve regurgitation without stenosis, mild MV calcified annulus, normal RV and tricuspid valve. Patient denies any symptoms consistent with orthopnea and has no evidence of edema or pulmonary crackles on exam concerning for volume overload. Home medication include Coreg 12 -daily weights -strict I/Os  -holding home Coreg in setting of hypotension in GI bleed  CAD  Patient with home medications include aspirin, rosuvastatin 10 mg daily. Troponins trended at 35>38 -trend troponins -holding home ASA in setting of GI bleeding -continue crestor  Insomnia Patient noted to have Trazodone 100mg  qHS for sleep. Given patient's dementia, will hold this during admission  -hold trazodone   GERD  Patient on home Protonix 40 mg daily.  -IV protonix 80mg  followed by protonix gtt  Protein calorie malnutrition  Albumin 3.0 on admission. Takes supplements at home  -nutrition consulted  FEN/GI: NPO Prophylaxis: SCDs  Disposition: admit to medical telemetry for transfusion of blood products   History of Present Illness:  [name] is a 85 y.o. female presenting with severe anemia following two falls.   Patient states she was told she fell but does not remember falling. Patient has history of dementia so we called her daughter to give history for this admission.    Daugther states that last 2 weeks, she was having back pain and went to the ED because her hemoglobin was reportedly low and she had a UTI. She was subsequently admitted to  [facility] 08/10/19-08/13/19. Patient went to [facility] rehab and had a fall this morning. Between 1 and 2 pm , she fell face first for a second time and suffered a head laceration. When she went to [facility], her hemoglobin was low (5.4) and she had blood in her stool. She was subsequently transferred to [facility] for blood transfusion.  Patient lives with her daughter, [name], who is patient's power of attorney. Patient is noted to have been more confused and irritable prior to her recent admission on 9/27. After rehab, she was behaving at her baseline. Patient requires 24/7 assistance for ADLs and is usually able to communicate where she lives and her birth date. Patient is oriented to location and person.   Patient's daughter requests that her mother be discharged home and not to rehab as she has not seemed to improve while there.  Daugther states that she is home 24/7 and when she needs to step out there is always someone in the home to help with Ms. Grindel ADLs so she feels more comfortable with her mother being discharge home.   Patient's daughter requests to hold off on GI consult at the time.   Patient is reported to not have used snuff in 1 month.    Patient Active Problem List   Diagnosis Date Noted  .  Weakness 08/12/2019  . Constipation 01/29/2019  . Chronic breast pain 12/26/2018  . Pain in gums 12/26/2018  . History of deep vein thrombosis 10/17/2018  . Severe protein-calorie malnutrition Altamease Oiler: less than 60% of standard weight) ([place]) 10/17/2018  . Disorder associated with well-controlled type 2 diabetes mellitus ([place]) 10/17/2018  . History of pulmonary embolus (PE) 10/17/2018  . Hypomagnesemia 08/27/2018  . Left ventricular hypertrophy by electrocardiogram 08/27/2018  . Atypical chest pain 08/26/2018  . Advanced nonexudative age-related macular degeneration of both eyes without subfoveal involvement 07/30/2018  . Altered mental status 06/06/2018   . Microcytic anemia   . TIA (transient ischemic attack) 06/03/2018  . Mobility impaired 04/26/2018  . Protein-calorie malnutrition, severe 02/06/2017  . Urinary tract infection without hematuria 02/03/2017  . Hypokalemia 10/13/2016  . Pressure injury of heel, stage 2 ([place]) 08/31/2016  . Pulmonary embolus ([place]) 08/30/2016  . Dementia without behavioral disturbance ([place]) 06/01/2016  . Depression 01/20/2016  . Suicidal ideation 01/19/2016  . Prolapse of female pelvic organs 12/28/2015  . Leg weakness, bilateral 12/08/2015  . Weakness of right lower extremity 11/25/2015  . Dry eyes 10/28/2015  . (HFpEF) heart failure with preserved ejection fraction ([place])   . Gout 05/14/2015  . Delirium secondary to multiple medical problems 04/15/2015  . Primary hypertension   . Seasonal allergies 02/10/2015  . Stroke-like symptoms   . Stroke-like episode 01/25/2015  . Syncope and collapse 01/25/2015  . Dizziness 09/19/2014  . Anemia of chronic illness 08/19/2014  . Acute superficial venous thrombosis of right lower extremity 08/19/2014  . Hypercalcemia 06/09/2014  . Breast cancer, left breast ([place]) 06/09/2014  . Breast cancer, right breast ([place]) 06/09/2014  . Edema of left lower extremity 03/30/2014  . PAD (peripheral artery disease) ([place]) 03/30/2014  . History of colon cancer 12/11/2013  . History of fall 05/15/2013  . Stricture and stenosis of esophagus 10/27/2012  . Thoracic back pain 05/24/2012  . Productive cough 04/30/2012  . Tobacco use disorder 06/02/2011  . Post-mastectomy pain 05/02/2011  . Hiatal hernia 03/22/2011  . Cough 01/31/2011  . 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 IDA (iron deficiency anemia) 04/29/2007  . Controlled type 2 diabetes mellitus with diabetic neuropathy, without long-term current use of insulin ([place]) 01/10/2007  . Coronary atherosclerosis 01/10/2007  . Asthma 01/10/2007  . Reflux esophagitis 01/10/2007  . Osteoarthritis, multiple sites 01/10/2007     Past Medical History: Past Medical History:  Diagnosis Date  . Allergy   . Anemia    Iron def anemia  . Arthritis   . Asthma   . Breast cancer ([place]) 10/2010   Invasive Ductal Carcinoma, s/p bilateral mastectomy, now on Tamoxifen. Followed by Dr Jamse Arn.   . Breast cancer ([place]) 2011   s/p Bilateral masectomy  . CAD (coronary artery disease)   . Carotid artery aneurysm ([place]) 08/2010   right ICA, 5 x 53mm  . CHF (congestive heart failure) (HCC)    Systolic   . Colon cancer ([place])   . Colon cancer ([place]) 12/11/2013  . DDD (degenerative disc disease), cervical   . Diabetes mellitus   . Diverticulosis   . GERD (gastroesophageal reflux disease)   . H/O: GI bleed   . Hiatal hernia   . History of colon cancer   . Hypercalcemia 06/09/2014  . Hyperlipidemia   . Hypertension   . Myocardial infarction ([place])   . Neuropathy   . Pneumonia 03/2012  . Pressure injury of heel, stage 2 ([place]) 08/31/2016  . Shortness of breath  Past Surgical History: Past Surgical History:  Procedure Laterality Date  . Breast masectomy  10/2010   Bilateral masectomy by Dr Lucia Gaskins s/p invasive ductal carcinoma.  Marland Kitchen BREAST SURGERY    . Cardiac  Catherization  2007   Severe 3-vessel disease.  EF 20-25%.  . ESOPHAGOGASTRODUODENOSCOPY  2001   Esophageal Tear  . ESOPHAGOGASTRODUODENOSCOPY  10/27/2012   Procedure: ESOPHAGOGASTRODUODENOSCOPY (EGD);  Surgeon: Irene Shipper, MD;  [facility] ENDOSCOPY;  Service: Endoscopy;  Laterality: N/A;  pat  . JOINT REPLACEMENT Right 2001   Knee  . TOTAL KNEE ARTHROPLASTY  2001    Social History: Social History   Tobacco Use  . Smoking status: Never Smoker  . Smokeless tobacco: Current User    Types: Snuff  Substance Use Topics  . Alcohol use: No  . Drug use: No   Additional social history: lives with her daughter, denies tobacco, alcohol and illicit drug use   Please also refer to relevant sections of EMR.  Family History: Family History  Problem Relation  Age of Onset  . Sickle cell anemia Other   . Heart disease Mother   . Heart disease Father   . Cancer Daughter 67       breast ca  . Hypertension Daughter   . Cancer Daughter 43       breast ca  . Hypertension Daughter   . Heart disease Son        Poor circulation-Left Leg  . Diabetes Daughter   . Hypertension Daughter   . Hyperlipidemia Daughter        Poor circulation- Toe amputation    Allergies and Medications: Allergies  Allergen Reactions  . Tape Other (See Comments)    Skin is very thin and will tear and bruise easily!!   . Lisinopril Cough   No current facility-administered medications on file prior to encounter.    Current Outpatient Medications on File Prior to Encounter  Medication Sig Dispense Refill  . amLODipine (NORVASC) 5 MG tablet Take 5 mg by mouth daily.    . carvedilol (COREG) 12.5 MG tablet Take 12.5 mg by mouth 2 (two) times daily with a meal.    . cephALEXin (KEFLEX) 500 MG capsule Take 500 mg by mouth 2 (two) times daily.    Marland Kitchen HYDROcodone-acetaminophen (NORCO/VICODIN) 5-325 MG tablet Take 2 tablets by mouth every 6 (six) hours as needed for moderate pain.    Marland Kitchen losartan (COZAAR) 50 MG tablet Take 50 mg by mouth daily.    . ranitidine (ZANTAC) 150 MG capsule Take 150 mg by mouth 2 (two) times daily.    Marland Kitchen acetaminophen (TYLENOL) 500 MG tablet Take 500 mg by mouth every 6 (six) hours as needed for moderate pain.     Marland Kitchen albuterol (VENTOLIN HFA) 108 (90 Base) MCG/ACT inhaler Inhale 2 puffs into the lungs every 6 (six) hours as needed. For shortness of breath 18 g 2  . Amino Acids-Protein Hydrolys (FEEDING SUPPLEMENT, PRO-STAT SUGAR FREE 64,) LIQD Take 30 mLs by mouth 2 (two) times daily with a meal. ensure (Patient not taking: Reported on 08/10/2019) 887 mL 0  . aspirin EC 325 MG tablet Take 1 tablet (325 mg total) by mouth daily. 90 tablet 3  . diclofenac sodium (VOLTAREN) 1 % GEL Apply 2 g topically 4 (four) times daily. (Patient not taking: Reported on  08/10/2019) 1 Tube 0  . donepezil (ARICEPT) 10 MG tablet Take 1 tablet (10 mg total) by mouth at bedtime. 90 tablet 0  .  ferrous sulfate 325 (65 FE) MG tablet Take 1 tablet (325 mg total) by mouth daily with breakfast. 30 tablet 0  . fluticasone (FLONASE) 50 MCG/ACT nasal spray PLACE 1 SPRAY INTO BOTH NOSTRILS DAILY. (Patient taking differently: Place 1 spray into both nostrils daily as needed for allergies or rhinitis. ) 16 g 0  . loratadine (CLARITIN) 10 MG tablet Take 0.5 tablets (5 mg total) by mouth daily as needed for allergies or rhinitis. 60 tablet 2  . pantoprazole (PROTONIX) 40 MG tablet Take 1 tablet (40 mg total) by mouth daily. 90 tablet 1  . polyethylene glycol (MIRALAX / GLYCOLAX) packet Take 17 g by mouth daily. (Patient not taking: Reported on 08/10/2019) 14 each 0  . potassium chloride SA (K-DUR) 20 MEQ tablet Take 1 tablet (20 mEq total) by mouth 2 (two) times daily for 7 days. (Patient not taking: Reported on 08/10/2019) 14 tablet 0  . rosuvastatin (CRESTOR) 20 MG tablet TAKE 1 TABLET BY MOUTH EVERYDAY AT BEDTIME (Patient taking differently: Take 20 mg by mouth at bedtime. ) 90 tablet 2  . senna-docusate (SENOKOT-S) 8.6-50 MG tablet Take 2 tablets by mouth at bedtime. (Patient taking differently: Take 1 tablet by mouth 2 (two) times daily as needed for mild constipation. ) 60 tablet 3  . traZODone (DESYREL) 100 MG tablet TAKE 1 TABLET (100 MG TOTAL) BY MOUTH AT BEDTIME AS NEEDED FOR SLEEP. (Patient taking differently: Take 100 mg by mouth at bedtime. ) 90 tablet 1  . [DISCONTINUED] cetirizine (ZYRTEC) 5 MG tablet Take 1 tablet (5 mg total) by mouth daily. Take at night time as it can cause some drowsiness 30 tablet 4    Objective: BP (!) 101/42   Pulse 94   Temp 97.9 F (36.6 C) (Oral)   Resp 15   Wt 49 kg   SpO2 98%   BMI 17.97 kg/m   Exam: General: elderly female, frail appearing, lying in bed beneath multiple blankets  Eyes: EOMI intact bilateral, no scleral icterus     Cardiovascular: tachycardic, no murmurs appreciated, cap refill delayed Respiratory: CTAB without wheezing, no crackles appreciates  Gastrointestinal: soft, NT, bowel sounds present MSK: limited ROM, muscle atrophy  Derm: conjunctival pallor, ~1cm area of scral skin breakdown, no surrounding erythema  Neuro: alert, oriented to self and location, not oriented to year, moves extremities, pupils equal and round, non dilated  Labs and Imaging: CBC BMET  Recent Labs  Lab 08/21/19 1553  WBC 7.2  HGB 5.4*  HCT 18.2*  PLT 217   Recent Labs  Lab 08/21/19 1553  NA 135  K 4.9  CL 110  CO2 18*  BUN 78*  CREATININE 1.53*  GLUCOSE 182*  CALCIUM 11.3*      Ref. Range 08/21/2019 15:53 08/21/2019 18:30  Troponin I (High Sensitivity) Latest Ref Range: <18 ng/L 35 (H) 38 (H)    Ref. Range 08/21/2019 17:14  Appearance Latest Ref Range: CLEAR  CLEAR  Bilirubin Urine Latest Ref Range: NEGATIVE  NEGATIVE  Color, Urine Latest Ref Range: YELLOW  YELLOW  Glucose, UA Latest Ref Range: NEGATIVE mg/dL NEGATIVE  Hgb urine dipstick Latest Ref Range: NEGATIVE  NEGATIVE  Ketones, ur Latest Ref Range: NEGATIVE mg/dL NEGATIVE  Leukocytes,Ua Latest Ref Range: NEGATIVE  MODERATE (A)  Nitrite Latest Ref Range: NEGATIVE  NEGATIVE  pH Latest Ref Range: 5.0 - 8.0  5.5  Protein Latest Ref Range: NEGATIVE mg/dL NEGATIVE  Specific Gravity, Urine Latest Ref Range: 1.005 - 1.030  1.020  Ref. Range 08/21/2019 17:20  Fecal Occult Blood, POC Latest Ref Range: NEGATIVE  POSITIVE (A)   EKG: T wave inversions in lateral leads, V4-V6, new from Sept 28 EKG  Dg Ribs Unilateral W/chest Left  Result Date: 08/21/2019 CLINICAL DATA:  Fall today from chair at nursing home landing on left side. EXAM: LEFT RIBS AND CHEST - 3+ VIEW COMPARISON:  Chest x-ray 05/02/2019 FINDINGS: Lungs are clear. No evidence of pneumothorax. Cardiomediastinal silhouette is within normal. There are degenerative changes of the spine. No evidence of  acute fracture. IMPRESSION: No acute findings. Electronically Signed   By: Marin Olp M.D.   On: 08/21/2019 17:27   Ct Head Wo Contrast  Result Date: 08/21/2019 CLINICAL DATA:  Fall. EXAM: CT HEAD WITHOUT CONTRAST CT CERVICAL SPINE WITHOUT CONTRAST TECHNIQUE: Multidetector CT imaging of the head and cervical spine was performed following the standard protocol without intravenous contrast. Multiplanar CT image reconstructions of the cervical spine were also generated. COMPARISON:  CT head and cervical spine dated Mar 14, 2019. FINDINGS: CT HEAD FINDINGS Brain: No evidence of acute infarction, hemorrhage, hydrocephalus, extra-axial collection or mass lesion/mass effect. Stable atrophy and chronic microvascular ischemic changes. Vascular: Calcified atherosclerosis at the skullbase. No hyperdense vessel. Skull: Negative for fracture or focal lesion. Sinuses/Orbits: No acute finding. Other: Small left forehead scalp hematoma and laceration. CT CERVICAL SPINE FINDINGS Alignment: No traumatic malalignment. Skull base and vertebrae: No acute fracture. No primary bone lesion or focal pathologic process. Soft tissues and spinal canal: No prevertebral fluid or swelling. No visible canal hematoma. Disc levels: Overall moderate diffuse cervical spondylosis, similar to prior study. Upper chest: Minimal centrilobular emphysema. Other: None. IMPRESSION: 1. No acute intracranial abnormality. Small left forehead scalp hematoma and laceration. 2.  No acute cervical spine fracture. Electronically Signed   By: Titus Dubin M.D.   On: 08/21/2019 15:57   Ct Cervical Spine Wo Contrast  Result Date: 08/21/2019 CLINICAL DATA:  Fall. EXAM: CT HEAD WITHOUT CONTRAST CT CERVICAL SPINE WITHOUT CONTRAST TECHNIQUE: Multidetector CT imaging of the head and cervical spine was performed following the standard protocol without intravenous contrast. Multiplanar CT image reconstructions of the cervical spine were also generated. COMPARISON:   CT head and cervical spine dated Mar 14, 2019. FINDINGS: CT HEAD FINDINGS Brain: No evidence of acute infarction, hemorrhage, hydrocephalus, extra-axial collection or mass lesion/mass effect. Stable atrophy and chronic microvascular ischemic changes. Vascular: Calcified atherosclerosis at the skullbase. No hyperdense vessel. Skull: Negative for fracture or focal lesion. Sinuses/Orbits: No acute finding. Other: Small left forehead scalp hematoma and laceration. CT CERVICAL SPINE FINDINGS Alignment: No traumatic malalignment. Skull base and vertebrae: No acute fracture. No primary bone lesion or focal pathologic process. Soft tissues and spinal canal: No prevertebral fluid or swelling. No visible canal hematoma. Disc levels: Overall moderate diffuse cervical spondylosis, similar to prior study. Upper chest: Minimal centrilobular emphysema. Other: None. IMPRESSION: 1. No acute intracranial abnormality. Small left forehead scalp hematoma and laceration. 2.  No acute cervical spine fracture. Electronically Signed   By: Titus Dubin M.D.   On: 08/21/2019 15:57   Dg Abd Acute W/chest  Result Date: 08/10/2019 CLINICAL DATA:  Nausea and vomiting EXAM: DG ABDOMEN ACUTE W/ 1V CHEST COMPARISON:  None. FINDINGS: Normal mediastinum and cardiac silhouette. Normal pulmonary vasculature. No evidence of effusion, infiltrate, or pneumothorax. No acute bony abnormality. No dilated large or small bowel. No intraperitoneal free air. Gas in the rectum. Surgical clips in the lower pelvis. Degenerate changes of the spine.  Osteoarthritis of the hips. IMPRESSION: 1.  No acute cardiopulmonary process. 2. No bowel obstruction. Electronically Signed   By: Suzy Bouchard M.D.   On: 08/10/2019 14:54    Stark Klein, MD 08/21/2019, 8:38 PM PGY-1, [place] Intern pager: [phone], text pages welcome   FPTS Upper-Level Resident Addendum   I have independently interviewed and examined the patient. I have  discussed the above with the original author and agree with their documentation. My edits for correction/addition/clarification are in blue. Please see also any attending notes.   [name], DO PGY-3, [facility] 08/22/2019 12:41 AM  FPTS Service pager: [phone] (text pages welcome through [facility])

## 2019-08-21 NOTE — ED Notes (Signed)
Amy charge RN informed MD of low hgb result

## 2019-08-21 NOTE — ED Provider Notes (Signed)
[facility] EMERGENCY DEPARTMENT Provider Note   CSN: [id number] Arrival date & time: 08/21/19  1442     History   Chief Complaint Chief Complaint  Patient presents with  . Fall    HPI [name] is a 85 y.o. female.     HPI   Pt is a 85 y/o female with a h/o anemia, breast CA s/p bilat mastectomy, CAD, carotid artery aneurysm, CHF, colon CA, DM, GERD, HLD, HTN, who presents to the ED today for eval after a fall. Pt brought in from [facility] via EMS after she had a witnessed fall from her chair to the floor. Per EMS report, she sustained head trauma but did not lose consciousness. Has lac to forehead.   Level 5 caveat 2/2 to pts h/o dementia. She does c/o pain to the left chest wall but otherwise does not c/o other injuries.   Attempted to call [facility] x3 and there was no answer and also no voicemail set up to leave a message.   Past Medical History:  Diagnosis Date  . Allergy   . Anemia    Iron def anemia  . Arthritis   . Asthma   . Breast cancer ([place]) 10/2010   Invasive Ductal Carcinoma, s/p bilateral mastectomy, now on Tamoxifen. Followed by Dr Jamse Arn.   . Breast cancer ([place]) 2011   s/p Bilateral masectomy  . CAD (coronary artery disease)   . Carotid artery aneurysm ([place]) 08/2010   right ICA, 5 x 15mm  . CHF (congestive heart failure) (HCC)    Systolic   . Colon cancer ([place])   . Colon cancer ([place]) 12/11/2013  . DDD (degenerative disc disease), cervical   . Diabetes mellitus   . Diverticulosis   . GERD (gastroesophageal reflux disease)   . H/O: GI bleed   . Hiatal hernia   . History of colon cancer   . Hypercalcemia 06/09/2014  . Hyperlipidemia   . Hypertension   . Myocardial infarction ([place])   . Neuropathy   . Pneumonia 03/2012  . Pressure injury of heel, stage 2 ([place]) 08/31/2016  . Shortness of breath     Patient Active Problem List   Diagnosis Date Noted  . Anemia 08/21/2019  . Weakness 08/12/2019  .  Constipation 01/29/2019  . Chronic breast pain 12/26/2018  . Pain in gums 12/26/2018  . History of deep vein thrombosis 10/17/2018  . Severe protein-calorie malnutrition Altamease Oiler: less than 60% of standard weight) ([place]) 10/17/2018  . Disorder associated with well-controlled type 2 diabetes mellitus ([place]) 10/17/2018  . History of pulmonary embolus (PE) 10/17/2018  . Hypomagnesemia 08/27/2018  . Left ventricular hypertrophy by electrocardiogram 08/27/2018  . Atypical chest pain 08/26/2018  . Advanced nonexudative age-related macular degeneration of both eyes without subfoveal involvement 07/30/2018  . Altered mental status 06/06/2018  . Microcytic anemia   . TIA (transient ischemic attack) 06/03/2018  . Mobility impaired 04/26/2018  . Protein-calorie malnutrition, severe 02/06/2017  . Urinary tract infection without hematuria 02/03/2017  . Hypokalemia 10/13/2016  . Pressure injury of heel, stage 2 ([place]) 08/31/2016  . Pulmonary embolus ([place]) 08/30/2016  . Dementia without behavioral disturbance ([place]) 06/01/2016  . Depression 01/20/2016  . Suicidal ideation 01/19/2016  . Prolapse of female pelvic organs 12/28/2015  . Leg weakness, bilateral 12/08/2015  . Weakness of right lower extremity 11/25/2015  . Dry eyes 10/28/2015  . (HFpEF) heart failure with preserved ejection fraction ([place])   . Gout 05/14/2015  .  Delirium secondary to multiple medical problems 04/15/2015  . Primary hypertension   . Seasonal allergies 02/10/2015  . Stroke-like symptoms   . Stroke-like episode 01/25/2015  . Syncope and collapse 01/25/2015  . Dizziness 09/19/2014  . Anemia of chronic illness 08/19/2014  . Acute superficial venous thrombosis of right lower extremity 08/19/2014  . Hypercalcemia 06/09/2014  . Breast cancer, left breast ([place]) 06/09/2014  . Breast cancer, right breast ([place]) 06/09/2014  . Edema of left lower extremity 03/30/2014  . PAD (peripheral artery disease) ([place]) 03/30/2014  . History of  colon cancer 12/11/2013  . History of fall 05/15/2013  . Stricture and stenosis of esophagus 10/27/2012  . Thoracic back pain 05/24/2012  . Productive cough 04/30/2012  . Tobacco use disorder 06/02/2011  . Post-mastectomy pain 05/02/2011  . Hiatal hernia 03/22/2011  . Cough 01/31/2011  . 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 IDA (iron deficiency anemia) 04/29/2007  . Controlled type 2 diabetes mellitus with diabetic neuropathy, without long-term current use of insulin ([place]) 01/10/2007  . Coronary atherosclerosis 01/10/2007  . Asthma 01/10/2007  . Reflux esophagitis 01/10/2007  . Osteoarthritis, multiple sites 01/10/2007    Past Surgical History:  Procedure Laterality Date  . Breast masectomy  10/2010   Bilateral masectomy by Dr Lucia Gaskins s/p invasive ductal carcinoma.  Marland Kitchen BREAST SURGERY    . Cardiac  Catherization  2007   Severe 3-vessel disease.  EF 20-25%.  . ESOPHAGOGASTRODUODENOSCOPY  2001   Esophageal Tear  . ESOPHAGOGASTRODUODENOSCOPY  10/27/2012   Procedure: ESOPHAGOGASTRODUODENOSCOPY (EGD);  Surgeon: Irene Shipper, MD;  Location: [facility] ENDOSCOPY;  Service: Endoscopy;  Laterality: N/A;  pat  . JOINT REPLACEMENT Right 2001   Knee  . TOTAL KNEE ARTHROPLASTY  2001     OB History   No obstetric history on file.      Home Medications    Prior to Admission medications   Medication Sig Start Date End Date Taking? Authorizing Provider  acetaminophen (TYLENOL) 500 MG tablet Take 500 mg by mouth every 6 (six) hours as needed for moderate pain.    Yes [provider]  albuterol (VENTOLIN HFA) 108 (90 Base) MCG/ACT inhaler Inhale 2 puffs into the lungs every 6 (six) hours as needed. For shortness of breath 07/15/19  Yes Abraham, Sherin, DO  Amino Acids-Protein Hydrolys (FEEDING SUPPLEMENT, PRO-STAT SUGAR FREE 64,) LIQD Take 30 mLs by mouth 2 (two) times daily with a meal. ensure 01/25/19  Yes [name], MD  amLODipine (NORVASC) 5 MG tablet Take 5 mg by mouth daily.    Yes [provider]  aspirin EC 325 MG tablet Take 1 tablet (325 mg total) by mouth daily. 07/05/18  Yes [place] Bing, DO  carvedilol (COREG) 12.5 MG tablet Take 12.5 mg by mouth 2 (two) times daily with a meal.   Yes [provider]  cephALEXin (KEFLEX) 500 MG capsule Take 500 mg by mouth 2 (two) times daily.   Yes [provider]  diclofenac sodium (VOLTAREN) 1 % GEL Apply 2 g topically 4 (four) times daily. 08/29/18  Yes Meccariello, Bernita Raisin, DO  donepezil (ARICEPT) 10 MG tablet Take 1 tablet (10 mg total) by mouth at bedtime. 07/05/19  Yes Mullis, Kiersten P, DO  ferrous sulfate 325 (65 FE) MG tablet Take 1 tablet (325 mg total) by mouth daily with breakfast. 08/12/19  Yes [name], MD  fluticasone (FLONASE) 50 MCG/ACT nasal spray PLACE 1 SPRAY INTO BOTH NOSTRILS DAILY. 04/30/19 04/29/20 Yes [place] Bing, DO  losartan (COZAAR) 50 MG tablet Take 50 mg by mouth daily.   Yes [provider]  pantoprazole (PROTONIX) 40 MG tablet Take 1 tablet (40 mg total) by mouth daily. 07/15/19  Yes Abraham, Sherin, DO  polyethylene glycol (MIRALAX / GLYCOLAX) packet Take 17 g by mouth daily. 01/25/19  Yes [name], MD  potassium chloride SA (K-DUR) 20 MEQ tablet Take 1 tablet (20 mEq total) by mouth 2 (two) times daily for 7 days. 04/21/19 08/21/19 Yes Kinnie Feil, PA-C  ranitidine (ZANTAC) 150 MG capsule Take 150 mg by mouth 2 (two) times daily.   Yes [provider]  rosuvastatin (CRESTOR) 20 MG tablet TAKE 1 TABLET BY MOUTH EVERYDAY AT BEDTIME Patient taking differently: Take 20 mg by mouth daily.  09/09/18  Yes [place] Bing, DO  senna-docusate (SENOKOT-S) 8.6-50 MG tablet Take 2 tablets by mouth at bedtime. Patient taking differently: Take 1 tablet by mouth 2 (two) times daily as needed for mild constipation.  04/23/17  Yes Bacigalupo, Dionne Bucy, MD  traZODone (DESYREL) 100 MG tablet TAKE 1 TABLET (100 MG TOTAL) BY MOUTH AT BEDTIME AS NEEDED FOR  SLEEP. Patient taking differently: Take 100 mg by mouth at bedtime.  07/12/19  Yes Mullis, Kiersten P, DO  cetirizine (ZYRTEC) 5 MG tablet Take 1 tablet (5 mg total) by mouth daily. Take at night time as it can cause some drowsiness 05/24/12 07/09/12  Losq, Burnell Blanks, MD    Family History Family History  Problem Relation Age of Onset  . Sickle cell anemia Other   . Heart disease Mother   . Heart disease Father   . Cancer Daughter 27       breast ca  . Hypertension Daughter   . Cancer Daughter 72       breast ca  . Hypertension Daughter   . Heart disease Son        Poor circulation-Left Leg  . Diabetes Daughter   . Hypertension Daughter   . Hyperlipidemia Daughter        Poor circulation- Toe amputation    Social History Social History   Tobacco Use  . Smoking status: Never Smoker  . Smokeless tobacco: Current User    Types: Snuff  Substance Use Topics  . Alcohol use: No  . Drug use: No     Allergies   Tape and Lisinopril   Review of Systems Review of Systems  Unable to perform ROS: Dementia    Physical Exam Updated Vital Signs BP (!) 90/42   Pulse 94   Temp 98.1 F (36.7 C) (Oral)   Resp 20   Wt 49 kg   SpO2 100%   BMI 17.97 kg/m   Physical Exam Vitals signs and nursing note reviewed.  Constitutional:      General: She is not in acute distress.    Appearance: She is well-developed.  HENT:     Head: Normocephalic and atraumatic.  Eyes:     Conjunctiva/sclera: Conjunctivae normal.  Neck:     Musculoskeletal: Neck supple.  Cardiovascular:     Rate and Rhythm: Normal rate and regular rhythm.     Heart sounds: Normal heart sounds. No murmur.     Comments: Marginally tachycardic Pulmonary:     Effort: Pulmonary effort is normal. No respiratory distress.     Breath sounds: Normal breath sounds. No wheezing, rhonchi or rales.  Abdominal:     General: Bowel sounds are normal.     Palpations: Abdomen is soft.  Tenderness: There is no abdominal  tenderness. There is no guarding or rebound.  Genitourinary:    Comments: Chaperone present during DRE. Dark stool noted in the rectal vault. No external hemorrhoids Musculoskeletal:     Comments: Mild cspine ttp. No focal TTP to the thoracic or lumbar spine. No TTP to the bilat hips or BUE/BLE.  Skin:    General: Skin is warm and dry.     Comments: <1 cm linear laceration to the left forehead.  Neurological:     Mental Status: She is alert.     Comments: Alert, oriented to self only. Clear speech. No facial droop. Moving all extremities with normal strength throughout.       ED Treatments / Results  Labs (all labs ordered are listed, but only abnormal results are displayed) Labs Reviewed  CBC WITH DIFFERENTIAL/PLATELET - Abnormal; Notable for the following components:      Result Value   RBC 2.47 (*)    Hemoglobin 5.4 (*)    HCT 18.2 (*)    MCV 73.7 (*)    MCH 21.9 (*)    MCHC 29.7 (*)    RDW 17.1 (*)    All other components within normal limits  COMPREHENSIVE METABOLIC PANEL - Abnormal; Notable for the following components:   CO2 18 (*)    Glucose, Bld 182 (*)    BUN 78 (*)    Creatinine, Ser 1.53 (*)    Calcium 11.3 (*)    Albumin 3.0 (*)    GFR calc non Af Amer 29 (*)    GFR calc Af Amer 34 (*)    All other components within normal limits  URINALYSIS, ROUTINE W REFLEX MICROSCOPIC - Abnormal; Notable for the following components:   Leukocytes,Ua MODERATE (*)    All other components within normal limits  OCCULT BLOOD X 1 CARD TO LAB, STOOL - Abnormal; Notable for the following components:   Fecal Occult Bld POSITIVE (*)    All other components within normal limits  URINALYSIS, MICROSCOPIC (REFLEX) - Abnormal; Notable for the following components:   Bacteria, UA FEW (*)    All other components within normal limits  TROPONIN I (HIGH SENSITIVITY) - Abnormal; Notable for the following components:   Troponin I (High Sensitivity) 35 (*)    All other components within  normal limits  TROPONIN I (HIGH SENSITIVITY) - Abnormal; Notable for the following components:   Troponin I (High Sensitivity) 38 (*)    All other components within normal limits  SARS CORONAVIRUS 2 BY RT PCR (HOSPITAL ORDER, [place] [facility])  URINE CULTURE  TYPE AND SCREEN  PREPARE RBC (CROSSMATCH)    EKG EKG Interpretation  Date/Time:  Thursday August 21 2019 15:48:37 EDT Ventricular Rate:  103 PR Interval:    QRS Duration: 75 QT Interval:  311 QTC Calculation: 407 R Axis:   40 Text Interpretation:  Sinus tachycardia Supraventricular bigeminy Probable LVH with secondary repol abnrm T wave inversions in lateral leads, V4-V6, new from Sept 28 EKG No STEMI  Confirmed by Octaviano Glow [phone]) on 08/21/2019 3:53:36 PM   Radiology Dg Ribs Unilateral W/chest Left  Result Date: 08/21/2019 CLINICAL DATA:  Fall today from chair at nursing home landing on left side. EXAM: LEFT RIBS AND CHEST - 3+ VIEW COMPARISON:  Chest x-ray 05/02/2019 FINDINGS: Lungs are clear. No evidence of pneumothorax. Cardiomediastinal silhouette is within normal. There are degenerative changes of the spine. No evidence of acute fracture. IMPRESSION: No acute findings. Electronically  Signed   By: Marin Olp M.D.   On: 08/21/2019 17:27   Ct Head Wo Contrast  Result Date: 08/21/2019 CLINICAL DATA:  Fall. EXAM: CT HEAD WITHOUT CONTRAST CT CERVICAL SPINE WITHOUT CONTRAST TECHNIQUE: Multidetector CT imaging of the head and cervical spine was performed following the standard protocol without intravenous contrast. Multiplanar CT image reconstructions of the cervical spine were also generated. COMPARISON:  CT head and cervical spine dated Mar 14, 2019. FINDINGS: CT HEAD FINDINGS Brain: No evidence of acute infarction, hemorrhage, hydrocephalus, extra-axial collection or mass lesion/mass effect. Stable atrophy and chronic microvascular ischemic changes. Vascular: Calcified atherosclerosis at the  skullbase. No hyperdense vessel. Skull: Negative for fracture or focal lesion. Sinuses/Orbits: No acute finding. Other: Small left forehead scalp hematoma and laceration. CT CERVICAL SPINE FINDINGS Alignment: No traumatic malalignment. Skull base and vertebrae: No acute fracture. No primary bone lesion or focal pathologic process. Soft tissues and spinal canal: No prevertebral fluid or swelling. No visible canal hematoma. Disc levels: Overall moderate diffuse cervical spondylosis, similar to prior study. Upper chest: Minimal centrilobular emphysema. Other: None. IMPRESSION: 1. No acute intracranial abnormality. Small left forehead scalp hematoma and laceration. 2.  No acute cervical spine fracture. Electronically Signed   By: Titus Dubin M.D.   On: 08/21/2019 15:57   Ct Cervical Spine Wo Contrast  Result Date: 08/21/2019 CLINICAL DATA:  Fall. EXAM: CT HEAD WITHOUT CONTRAST CT CERVICAL SPINE WITHOUT CONTRAST TECHNIQUE: Multidetector CT imaging of the head and cervical spine was performed following the standard protocol without intravenous contrast. Multiplanar CT image reconstructions of the cervical spine were also generated. COMPARISON:  CT head and cervical spine dated Mar 14, 2019. FINDINGS: CT HEAD FINDINGS Brain: No evidence of acute infarction, hemorrhage, hydrocephalus, extra-axial collection or mass lesion/mass effect. Stable atrophy and chronic microvascular ischemic changes. Vascular: Calcified atherosclerosis at the skullbase. No hyperdense vessel. Skull: Negative for fracture or focal lesion. Sinuses/Orbits: No acute finding. Other: Small left forehead scalp hematoma and laceration. CT CERVICAL SPINE FINDINGS Alignment: No traumatic malalignment. Skull base and vertebrae: No acute fracture. No primary bone lesion or focal pathologic process. Soft tissues and spinal canal: No prevertebral fluid or swelling. No visible canal hematoma. Disc levels: Overall moderate diffuse cervical spondylosis,  similar to prior study. Upper chest: Minimal centrilobular emphysema. Other: None. IMPRESSION: 1. No acute intracranial abnormality. Small left forehead scalp hematoma and laceration. 2.  No acute cervical spine fracture. Electronically Signed   By: Titus Dubin M.D.   On: 08/21/2019 15:57    Procedures .Marland KitchenLaceration Repair  Date/Time: 08/21/2019 5:55 PM Performed by: Rodney Booze, PA-C Authorized by: Rodney Booze, PA-C   Consent:    Consent obtained:  Verbal   Consent given by:  Patient (daughter)   Risks discussed:  Infection and pain   Alternatives discussed:  No treatment Anesthesia (see MAR for exact dosages):    Anesthesia method:  Local infiltration   Local anesthetic:  Lidocaine 2% w/o epi Laceration details:    Location: forehead.   Length (cm):  1 Repair type:    Repair type:  Simple Pre-procedure details:    Preparation:  Patient was prepped and draped in usual sterile fashion and imaging obtained to evaluate for foreign bodies Exploration:    Hemostasis achieved with:  Direct pressure   Wound exploration: wound explored through full range of motion and entire depth of wound probed and visualized   Treatment:    Area cleansed with:  Betadine   Amount of cleaning:  Standard Skin repair:    Repair method:  Sutures   Suture size:  5-0   Suture material:  Plain gut   Suture technique:  Simple interrupted   Number of sutures:  2 Approximation:    Approximation:  Close Post-procedure details:    Dressing:  Open (no dressing)   Patient tolerance of procedure:  Tolerated well, no immediate complications    CRITICAL CARE Performed by: Rodney Booze   Total critical care time: 32 minutes  Critical care time was exclusive of separately billable procedures and treating other patients.  Critical care was necessary to treat or prevent imminent or life-threatening deterioration.  Critical care was time spent personally by me on the following activities:  development of treatment plan with patient and/or surrogate as well as nursing, discussions with consultants, evaluation of patient's response to treatment, examination of patient, obtaining history from patient or surrogate, ordering and performing treatments and interventions, ordering and review of laboratory studies, ordering and review of radiographic studies, pulse oximetry and re-evaluation of patient's condition.  (including critical care time)  Medications Ordered in ED Medications  pantoprazole (PROTONIX) 80 mg in sodium chloride 0.9 % 250 mL (0.32 mg/mL) infusion (8 mg/hr Intravenous New Bag/Given 08/21/19 2210)  sodium chloride 0.9 % bolus 1,000 mL ( Intravenous Stopped 08/21/19 1821)  lidocaine (XYLOCAINE) 2 % (with pres) injection 200 mg (200 mg Intradermal Given 08/21/19 1614)  pantoprazole (PROTONIX) injection 40 mg (40 mg Intravenous Given 08/21/19 1749)  Tdap (BOOSTRIX) injection 0.5 mL (0.5 mLs Intramuscular Given 08/21/19 1749)  0.9 %  sodium chloride infusion (Manually program via Guardrails IV Fluids) ( Intravenous New Bag/Given 08/21/19 2212)     Initial Impression / Assessment and Plan / ED Course  I have reviewed the triage vital signs and the nursing notes.  Pertinent labs & imaging results that were available during my care of the patient were reviewed by me and considered in my medical decision making (see chart for details).  Clinical Course as of Aug 20 2214  Thu Aug 21, 2019  1553 Patient was seen by myself as well as PA provider.  Briefly this is a 85 year old female with recent hospitalization for hypokalemia, presented to the emergency department with a fall.  She presents from her nursing facility.  She had a reported mechanical fall at this facility.  The patient is denying any symptoms on my exam.  She is pending CT imaging of the brain and C-spine.  She also presents here with sinus tachycardia with a heart rate around 100.  Her blood pressure is stable.  She has  not had tachycardia on prior EKGs, however from her recent hospitalization it does look like her Coreg was stopped at the time.   [MT]  [id number] With no hypoxia, tachypnea or respiratory symptoms have a lower suspicion for PE at this time.  Her EKG does demonstrate some nonspecific T wave inversions in the lateral leads V4 through V6 which are new from prior.  We will therefore check labs including a troponin level.  If the labs are unremarkable and her heart rate improves with fluids I believe it is reasonable to discharge her home if her CT scans are also negative.     [MT]    Clinical Course User Index [MT] Trifan, Carola Rhine, MD       Final Clinical Impressions(s) / ED Diagnoses   Final diagnoses:  Gastrointestinal hemorrhage, unspecified gastrointestinal hemorrhage type  Anemia, unspecified type  Facial laceration, initial encounter  85 year old female with history of dementia presenting for evaluation for witnessed fall that occurred at her facility prior to arrival.  She fell from a chair to the ground and sustained head trauma but did not lose consciousness.  Facility unavailable to be called for further questions.  Patient history limited.  Will obtain imaging of the head, cervical spine and left ribs.  We will also check labs given her marginal tachycardia.   CBC is without leukocytosis.  Does have significant anemia of 5.4 which is down 4 points from 9 days ago. CMP with elevated BUN/creatinine which is new from 10 days ago.  This is likely prerenal.  Electrolytes are reassuring. Trop is marginally elevated, she does have new T wave inversions are nonspecific.  This may be related to demand in setting of significantly low hemoglobin.  She denies any chest pain. UA with moderate leuks, 11-20 RBCs, few bacteria and 6-10 squamous epithelial cells.  EKG has new nonspecific T wave inversions.  CT head negative negative for acute injury CT cervical spine negative for acute injury   CXR is negative for acute findings  Blood products are not readily available at [place], will consult for admission for transfer and further tx.   5:59 PM CONSULT with Dr Roselie Skinner with family medicine residency who states that patient will need to be transferred ED to ED prior to admission.  6:07 PM discussed case with Dr. Vanita Panda who accepts patient for transfer to [facility].  ED Discharge Orders    None       B[place] 08/21/19 2215    Wyvonnia Dusky, MD 08/22/19 [phone]

## 2019-08-21 NOTE — ED Triage Notes (Signed)
Pt is from NH brought in by EMS , witnessed fall from chair to floor with lac to over left eye. NO LOC

## 2019-08-21 NOTE — ED Notes (Signed)
ED Provider at bedside. 

## 2019-08-22 ENCOUNTER — Other Ambulatory Visit: Payer: Self-pay | Admitting: *Deleted

## 2019-08-22 DIAGNOSIS — F0391 Unspecified dementia with behavioral disturbance: Secondary | ICD-10-CM | POA: Diagnosis not present

## 2019-08-22 DIAGNOSIS — Z681 Body mass index (BMI) 19 or less, adult: Secondary | ICD-10-CM | POA: Diagnosis not present

## 2019-08-22 DIAGNOSIS — R64 Cachexia: Secondary | ICD-10-CM | POA: Diagnosis not present

## 2019-08-22 DIAGNOSIS — Z66 Do not resuscitate: Secondary | ICD-10-CM | POA: Diagnosis not present

## 2019-08-22 DIAGNOSIS — E46 Unspecified protein-calorie malnutrition: Secondary | ICD-10-CM | POA: Diagnosis not present

## 2019-08-22 DIAGNOSIS — D649 Anemia, unspecified: Secondary | ICD-10-CM

## 2019-08-22 DIAGNOSIS — K921 Melena: Secondary | ICD-10-CM | POA: Diagnosis not present

## 2019-08-22 DIAGNOSIS — N179 Acute kidney failure, unspecified: Secondary | ICD-10-CM | POA: Diagnosis not present

## 2019-08-22 DIAGNOSIS — Z20828 Contact with and (suspected) exposure to other viral communicable diseases: Secondary | ICD-10-CM | POA: Diagnosis not present

## 2019-08-22 DIAGNOSIS — R7989 Other specified abnormal findings of blood chemistry: Secondary | ICD-10-CM | POA: Diagnosis not present

## 2019-08-22 DIAGNOSIS — I2510 Atherosclerotic heart disease of native coronary artery without angina pectoris: Secondary | ICD-10-CM | POA: Diagnosis not present

## 2019-08-22 DIAGNOSIS — I248 Other forms of acute ischemic heart disease: Secondary | ICD-10-CM | POA: Diagnosis not present

## 2019-08-22 DIAGNOSIS — Z23 Encounter for immunization: Secondary | ICD-10-CM | POA: Diagnosis not present

## 2019-08-22 DIAGNOSIS — D62 Acute posthemorrhagic anemia: Secondary | ICD-10-CM | POA: Diagnosis not present

## 2019-08-22 DIAGNOSIS — I5042 Chronic combined systolic (congestive) and diastolic (congestive) heart failure: Secondary | ICD-10-CM | POA: Diagnosis not present

## 2019-08-22 LAB — TROPONIN I (HIGH SENSITIVITY)
Troponin I (High Sensitivity): 189 ng/L (ref <18)
Troponin I (High Sensitivity): 217 ng/L (ref <18)
Troponin I (High Sensitivity): 303 ng/L (ref <18)

## 2019-08-22 LAB — CBC
HCT: 31.5 % — ABNORMAL LOW (ref 36.0–46.0)
HCT: 32.1 % — ABNORMAL LOW (ref 36.0–46.0)
Hemoglobin: 10.7 g/dL — ABNORMAL LOW (ref 12.0–15.0)
Hemoglobin: 10.5 g/dL — ABNORMAL LOW (ref 12.0–15.0)
MCH: 27.2 pg (ref 26.0–34.0)
MCH: 26.6 pg (ref 26.0–34.0)
MCHC: 34 g/dL (ref 30.0–36.0)
MCHC: 32.7 g/dL (ref 30.0–36.0)
MCV: 80.2 fL (ref 80.0–100.0)
MCV: 81.5 fL (ref 80.0–100.0)
Platelets: 147 10*3/uL — ABNORMAL LOW (ref 150–400)
Platelets: 120 10*3/uL — ABNORMAL LOW (ref 150–400)
RBC: 3.93 MIL/uL (ref 3.87–5.11)
RBC: 3.94 MIL/uL (ref 3.87–5.11)
RDW: 17.4 % — ABNORMAL HIGH (ref 11.5–15.5)
RDW: 17.6 % — ABNORMAL HIGH (ref 11.5–15.5)
WBC: 5.4 10*3/uL (ref 4.0–10.5)
WBC: 5 10*3/uL (ref 4.0–10.5)
nRBC: 0.4 % — ABNORMAL HIGH (ref 0.0–0.2)
nRBC: 0.6 % — ABNORMAL HIGH (ref 0.0–0.2)

## 2019-08-22 LAB — BASIC METABOLIC PANEL
Anion gap: 9 (ref 5–15)
BUN: 46 mg/dL — ABNORMAL HIGH (ref 8–23)
CO2: 18 mmol/L — ABNORMAL LOW (ref 22–32)
Calcium: 10.6 mg/dL — ABNORMAL HIGH (ref 8.9–10.3)
Chloride: 116 mmol/L — ABNORMAL HIGH (ref 98–111)
Creatinine, Ser: 1.24 mg/dL — ABNORMAL HIGH (ref 0.44–1.00)
GFR calc Af Amer: 43 mL/min — ABNORMAL LOW (ref >60)
GFR calc non Af Amer: 37 mL/min — ABNORMAL LOW (ref >60)
Glucose, Bld: 89 mg/dL (ref 70–99)
Potassium: 4.3 mmol/L (ref 3.5–5.1)
Sodium: 143 mmol/L (ref 135–145)

## 2019-08-22 LAB — URINE CULTURE: Culture: NO GROWTH

## 2019-08-22 LAB — HEMOGLOBIN A1C
Hgb A1c MFr Bld: 6 % — ABNORMAL HIGH (ref 4.8–5.6)
Mean Plasma Glucose: 125.5 mg/dL

## 2019-08-22 MED ORDER — ALBUTEROL SULFATE (2.5 MG/3ML) 0.083% IN NEBU: 2.5000 mg | INHALATION_SOLUTION | Freq: Four times a day (QID) | RESPIRATORY_TRACT | Status: DC | PRN

## 2019-08-22 NOTE — Progress Notes (Signed)
Initial Nutrition Assessment  DOCUMENTATION CODES:   Underweight  INTERVENTION:  -Advance diet as medically feasible -Provide nutrition supplement as appropriate; patient amenable to Ensure per chart review  NUTRITION DIAGNOSIS:   Increased nutrient needs related to acute illness as evidenced by estimated needs.  GOAL:   Patient will meet greater than or equal to 90% of their needs   MONITOR:   Diet advancement, Labs, Weight trends, I & O's  REASON FOR ASSESSMENT:   Consult Assessment of nutrition requirement/status  ASSESSMENT:  RD working remotely.  85 year old female transferred from [place] after fall x2 at nursing facility. Patient found to have Hgb 5.4; +FOBT on presentation to [facility] ED and received 2 units PRBC. Past medical history significant for chronic microcytic iron deficiency anemia, breast cancer s/p bilateral mastectomy, CAD, hx of carotid artery aneurysm, CHF, colon cancer, T2DM, GERD, HLD, HTN  Per chart review, patient reports that she is feeling much better s/p first unit of blood was completed. Tachycardia resolved; blood pressures improving.   Patient is NPO, will continue to monitor for diet advancement. Patient is 86% of IBW and has experienced wt loss over the past 4 months per review of weight history provide. She is at increased risk for malnutrition given advanced age as well as history of present illness. Will provide nutrition supplement when appropriate. Per chart review patient amenable to Ensure in the past.  I/Os: +1666 ml since admit  Current wt 49 kg (107.8 lb) Patient noted with 6.6 lb wt loss over the past 4 months which is insignificant for time frame, but concerning considering advanced age. Per review of weight history, patient UBW 49 kg -52.2 kg over the past year.  Wt Readings from Last 10 Encounters:  08/21/19 49 kg  05/02/19 52 kg  04/21/19 52 kg  04/13/19 52.2 kg  03/14/19 50.4 kg  01/23/19 50.4 kg  01/15/19  50.4 kg  12/26/18 49.7 kg  10/22/18 51.3 kg  10/15/18 50.3 kg     Medications reviewed and include: protonix, crestor, aricept  Labs: Glucose 182 BUN 78 Cr 1.53 Ca corrected for low albumin 12.1 (H) Hgb 5.4   NUTRITION - FOCUSED PHYSICAL EXAM: Unable to complete at this time; RD working remotely.   Diet Order:   Diet Order            Diet NPO time specified  Diet effective now              EDUCATION NEEDS:   No education needs have been identified at this time  Skin:  Skin Assessment: Reviewed RN Assessment  Last BM:  PTA  Height:   Ht Readings from Last 1 Encounters:  05/02/19 5\' 5"  (1.651 m)    Weight:   Wt Readings from Last 1 Encounters:  08/21/19 49 kg    Ideal Body Weight:  56.8 kg  BMI:  Body mass index is 17.97 kg/m.  Estimated Nutritional Needs:   Kcal:  1400-1600  Protein:  59-69  Fluid:  >/= 1.2 L/day   Lajuan Lines, RD, LDN Clinical Nutrition Office [phone] After Hours/Weekend Pager: [phone]

## 2019-08-22 NOTE — Progress Notes (Signed)
FPTS Interim Progress Note  Went to check on patient now that patient's first unit of blood was completed.  Patient states she is feeling much better.  Tachycardia has now resolved.  Blood pressures were improving.  Patient reports she overall is feeling much better after getting blood.  Patient confirms that daughter is healthcare power of attorney and she hopes to be discharged home with daughter.  Patient also confirms that she would not like any invasive measures at this time such as a scope by GI.  O: BP (!) 111/33   Pulse 83   Temp 99.1 F (37.3 C) (Oral)   Resp 15   Wt 49 kg   SpO2 98%   BMI 17.97 kg/m   General: Awake and alert, laying comfortably in bed, no apparent distress, speaking full sentences HEENT: Small 1 cm laceration on left forehead Extremities: No edema, warm to touch  A/P: Anemia -Complete second unit of packed red blood cells -Posttransfusion H&H -Continue Protonix -Continue normal saline   [name], DO 08/22/2019, 4:38 AM PGY-3, [place] pager [phone]

## 2019-08-22 NOTE — Consult Note (Signed)
   [facility] CM Inpatient Consult   08/22/2019  [name] 12/25/1923 [id number]  THN status:  Pending    Alerted of the patient's re-hospitalization in less than 30 days and currently in observation status from skilled facility [facility] and now possibly returning home with daughter, in observation status for severe anemia after a fall.   Patient is a DNR per notes.   Will follow for progress/disposition needs  Natividad Brood, RN BSN [facility] Liaison  [phone] business mobile phone Toll free office [phone]  Fax number: [phone] [name]

## 2019-08-22 NOTE — Consult Note (Signed)
Cardiology Consultation:   Patient ID: [name]; [id number]; 1924-02-22   Admit date: 08/21/2019 Date of Consult: 08/22/2019  Primary Care Provider: Danna Hefty, DO Primary Cardiologist: New to [place]; Dr. [place] Primary Electrophysiologist:  None  Patient Profile:   [name] is a 85 y.o. female with a PMH of CAD with 3-vessel CAD not a candidate for CABG with subsequent PCI/DES to LAD, Chronic combined CHF (EF reportedly 20-25% in 2008, improved to 60-65% 05/2018), PAD, HTN, HLD, DM type 2, GERD, breast cancer s/p mastectomy, and dementia who is being seen today for the evaluation of elevated troponins and EKG changes at the request of Dr. Gwendlyn Deutscher.  History of Present Illness:   [name] was recently admitted to the hospital 08/10/2019-08/13/2019 for hypokalemia in the setting of poor po intake, GI upset, and UTI. She unfortunately suffered a fall 08/21/2019 c/b head laceration but no loss of consciousness. She was found to have a hemoglobin of 5.4 but no complaints of bleeding. FOBT was positive. Also found to have TWI in lateral leads on EKG which improved on repeat EKG after patient received 2 uPRBC. Troponin trend: [id number]. The ED provider reported left chest wall pain. She was admitted to medicine. Hemoglobin improved to 10. She was given IVFs for management of [name] with Cr peaked at 1.5, improved to 1.24. Aspirin held in the setting of GI bleed. Home antihypertensives on hold given hypotension in the setting of acute blood loss anemia. Cardiology asked to evaluate for elevated troponin and EKG changes.   Past Medical History:  Diagnosis Date   Allergy    Anemia    Iron def anemia   Arthritis    Asthma    Breast cancer ([place]) 10/2010   Invasive Ductal Carcinoma, s/p bilateral mastectomy, now on Tamoxifen. Followed by Dr Jamse Arn.    Breast cancer ([place]) 2011   s/p Bilateral masectomy   CAD (coronary artery disease)    Carotid artery aneurysm ([place])  08/2010   right ICA, 5 x 40mm   CHF (congestive heart failure) ([place])    Systolic    Colon cancer ([place])    Colon cancer ([place]) 12/11/2013   DDD (degenerative disc disease), cervical    Diabetes mellitus    Diverticulosis    GERD (gastroesophageal reflux disease)    H/O: GI bleed    Hiatal hernia    History of colon cancer    Hypercalcemia 06/09/2014   Hyperlipidemia    Hypertension    Myocardial infarction ([place])    Neuropathy    Pneumonia 03/2012   Pressure injury of heel, stage 2 ([place]) 08/31/2016   Shortness of breath     Past Surgical History:  Procedure Laterality Date   Breast masectomy  10/2010   Bilateral masectomy by Dr Lucia Gaskins s/p invasive ductal carcinoma.   BREAST SURGERY     Cardiac  Catherization  2007   Severe 3-vessel disease.  EF 20-25%.   ESOPHAGOGASTRODUODENOSCOPY  2001   Esophageal Tear   ESOPHAGOGASTRODUODENOSCOPY  10/27/2012   Procedure: ESOPHAGOGASTRODUODENOSCOPY (EGD);  Surgeon: Irene Shipper, MD;  Location[facility] ENDOSCOPY;  Service: Endoscopy;  Laterality: N/A;  pat   JOINT REPLACEMENT Right 2001   Knee   TOTAL KNEE ARTHROPLASTY  2001     Home Medications:  Prior to Admission medications   Medication Sig Start Date End Date Taking? Authorizing Provider  acetaminophen (TYLENOL) 500 MG tablet Take 500 mg by mouth every 6 (six) hours as needed for moderate pain.  Yes [provider]  albuterol (VENTOLIN HFA) 108 (90 Base) MCG/ACT inhaler Inhale 2 puffs into the lungs every 6 (six) hours as needed. For shortness of breath 07/15/19  Yes Abraham, Sherin, DO  Amino Acids-Protein Hydrolys (FEEDING SUPPLEMENT, PRO-STAT SUGAR FREE 64,) LIQD Take 30 mLs by mouth 2 (two) times daily with a meal. ensure 01/25/19  Yes [name], MD  amLODipine (NORVASC) 5 MG tablet Take 5 mg by mouth daily.   Yes [provider]  aspirin EC 325 MG tablet Take 1 tablet (325 mg total) by mouth daily. 07/05/18  Yes Afton Bing, DO    carvedilol (COREG) 12.5 MG tablet Take 12.5 mg by mouth 2 (two) times daily with a meal.   Yes [provider]  cephALEXin (KEFLEX) 500 MG capsule Take 500 mg by mouth 2 (two) times daily.   Yes [provider]  diclofenac sodium (VOLTAREN) 1 % GEL Apply 2 g topically 4 (four) times daily. 08/29/18  Yes Meccariello, Bernita Raisin, DO  donepezil (ARICEPT) 10 MG tablet Take 1 tablet (10 mg total) by mouth at bedtime. 07/05/19  Yes Mullis, Kiersten P, DO  ferrous sulfate 325 (65 FE) MG tablet Take 1 tablet (325 mg total) by mouth daily with breakfast. 08/12/19  Yes [name], MD  fluticasone (FLONASE) 50 MCG/ACT nasal spray PLACE 1 SPRAY INTO BOTH NOSTRILS DAILY. 04/30/19 04/29/20 Yes [place] Bing, DO  losartan (COZAAR) 50 MG tablet Take 50 mg by mouth daily.   Yes [provider]  pantoprazole (PROTONIX) 40 MG tablet Take 1 tablet (40 mg total) by mouth daily. 07/15/19  Yes Abraham, Sherin, DO  polyethylene glycol (MIRALAX / GLYCOLAX) packet Take 17 g by mouth daily. 01/25/19  Yes [name], MD  potassium chloride SA (K-DUR) 20 MEQ tablet Take 1 tablet (20 mEq total) by mouth 2 (two) times daily for 7 days. 04/21/19 08/21/19 Yes Kinnie Feil, PA-C  ranitidine (ZANTAC) 150 MG capsule Take 150 mg by mouth 2 (two) times daily.   Yes [provider]  rosuvastatin (CRESTOR) 20 MG tablet TAKE 1 TABLET BY MOUTH EVERYDAY AT BEDTIME Patient taking differently: Take 20 mg by mouth daily.  09/09/18  Yes [place] Bing, DO  senna-docusate (SENOKOT-S) 8.6-50 MG tablet Take 2 tablets by mouth at bedtime. Patient taking differently: Take 1 tablet by mouth 2 (two) times daily as needed for mild constipation.  04/23/17  Yes Bacigalupo, Dionne Bucy, MD  traZODone (DESYREL) 100 MG tablet TAKE 1 TABLET (100 MG TOTAL) BY MOUTH AT BEDTIME AS NEEDED FOR SLEEP. Patient taking differently: Take 100 mg by mouth at bedtime.  07/12/19  Yes Mullis, Kiersten P, DO  cetirizine (ZYRTEC) 5 MG  tablet Take 1 tablet (5 mg total) by mouth daily. Take at night time as it can cause some drowsiness 05/24/12 07/09/12  Losq, Burnell Blanks, MD    Inpatient Medications: Scheduled Meds:  donepezil  10 mg Oral QHS   [START ON 08/24/2019] pantoprazole (PROTONIX) IV  40 mg Intravenous Q12H   rosuvastatin  10 mg Oral QHS   Continuous Infusions:  sodium chloride 75 mL/hr at 08/22/19 0600   pantoprozole (PROTONIX) infusion 8 mg/hr (08/22/19 0729)   PRN Meds: albuterol  Allergies:    Allergies  Allergen Reactions   Tape Other (See Comments)    Skin is very thin and will tear and bruise easily!!    Lisinopril Cough    Social History:   Social History   Socioeconomic History  Marital status: Widowed    Spouse name: Not on file   Number of children: 67   Years of education: Not on file   Highest education level: 7th grade  Occupational History   Not on file  Social Needs   Financial resource strain: Not hard at all   Food insecurity    Worry: Not on file    Inability: Not on file   Transportation needs    Medical: Not on file    Non-medical: Not on file  Tobacco Use   Smoking status: Never Smoker   Smokeless tobacco: Current User    Types: Snuff  Substance and Sexual Activity   Alcohol use: No   Drug use: No   Sexual activity: Never  Lifestyle   Physical activity    Days per week: 0 days    Minutes per session: 0 min   Stress: Not at all  Relationships   Social connections    Talks on phone: More than three times a week    Gets together: More than three times a week    Attends religious service: Never    Active member of club or organization: No    Attends meetings of clubs or organizations: Never    Relationship status: Widowed   Intimate partner violence    Fear of current or ex partner: Not on file    Emotionally abused: Not on file    Physically abused: Not on file    Forced sexual activity: Not on file  Other Topics Concern   Not  on file  Social History Narrative   Widowed.  She lives with her son and has 2 daughters who help with her care.  She has a large extended family in the area who are very involved.       Patient is right-handed. She now lives with one of her daughters in a one story home..    Family History:    Family History  Problem Relation Age of Onset   Sickle cell anemia Other    Heart disease Mother    Heart disease Father    Cancer Daughter 43       breast ca   Hypertension Daughter    Cancer Daughter 12       breast ca   Hypertension Daughter    Heart disease Son        Poor circulation-Left Leg   Diabetes Daughter    Hypertension Daughter    Hyperlipidemia Daughter        Poor circulation- Toe amputation     ROS:  Please see the history of present illness.   All other ROS reviewed and negative.     Physical Exam/Data:   Vitals:   08/22/19 0600 08/22/19 0615 08/22/19 0745 08/22/19 0815  BP: (!) 120/41 (!) 136/55 (!) 147/46 (!) 168/71  Pulse: 76 74 83 81  Resp: 14 16 12    Temp:    98.3 F (36.8 C)  TempSrc:    Oral  SpO2: 100% 100% 100% 98%  Weight:        Intake/Output Summary (Last 24 hours) at 08/22/2019 1447 Last data filed at 08/22/2019 0600 Gross per 24 hour  Intake 1666 ml  Output --  Net 1666 ml   Filed Weights   08/21/19 1448  Weight: 49 kg   Body mass index is 17.97 kg/m.  General:  Thin elderly female sitting upright in bedside chair eating dinner in NAD HEENT: sclera anicteric, poor dentition  Neck: no JVD Vascular: No carotid bruits; distal pulses 2+ bilaterally Cardiac:  normal S1, S2; RRR; no murmur, rubs, or gallops Lungs:  clear to auscultation bilaterally, no wheezing, rhonchi or rales  Abd: NABS, soft, nontender, no hepatomegaly Ext: no edema Musculoskeletal:  No deformities, BUE and BLE strength normal and equal Skin: warm and dry  Neuro:  CNs 2-12 intact, no focal abnormalities noted Psych:  Normal affect   EKG:  The EKG was  personally reviewed and demonstrates:  Initially sinus tachycardia with supraventricular bigeminy, rate 103, TWI in V3-6, no STE/D. Repeat EKG 08/22/2019 with NSR, rate 82, non-specific T wave abnormalities with overall improvement in TWI, no STE/D Telemetry:  Telemetry was personally reviewed and demonstrates:  NSR with PACs, occasional sinus tachycardia  Relevant CV Studies: Left heart catheterization 2008: 1. The left main coronary artery has a 20% distal eccentric narrowing      and then bifurcates in the left anterior descending artery and left      circumflex artery.  2. The left anterior descending artery has a proximal 40% eccentric      narrowing and then a 95% proximal stenosis before the takeoff of a      large first diagonal branch.  The first diagonal is widely patent      and bifurcates into two daughter branches, both of which are widely      patent.  The ongoing LAD is diffusely diseased up to 50% and      traverses across the apex.  It gives rise to a second diagonal      branch, which has a 90% proximal stenosis.  3. The left circumflex has a proximal hazy lesion that appears 60-70%      stenosed and then just distal about a 50% narrowing before giving      rise to a first large obtuse marginal branch, which has an ostial      50% narrowing and then bifurcates into daughter branches, both of      which are widely patent.  The ongoing left circumflex traverses the      AV groove and is diffusely diseased up to 40%.  4. The right coronary artery has a mid 60-70% narrowing which is hazy      and an eccentric 60% lesion right before it bifurcates into a      posterior descending artery and posterolateral artery.   Left ventriculography shows severe LV dysfunction, EF 20-25% with  anterior lateral and inferior apical akinesis and an aneurysmal inferior  apex.  Aortic pressure 129/53 mmHg, LV pressure 132/-6 6 mmHg.   Distal aortography shows an 80-90% right renal artery  stenosis.   ASSESSMENT:  1. Severe three-vessel coronary disease.  2. Severe left ventricular dysfunction with anterior lateral and      inferior apical akinesis and an apical aneurysmal segment, ejection      fraction 20-25%.  3. A 90% right renal artery stenosis.  4. Congestive heart failure.  5. Diabetes mellitus.  6. Hypertension.  Echocardiogram 05/2018: - Left ventricle: The cavity size was normal. There was moderate   focal basal hypertrophy of the septum. Systolic function was   normal. The estimated ejection fraction was in the range of 60%   to 65%. There was dynamic obstruction at rest, with a peak   velocity of 143 cm/sec and a peak gradient of 8 mm Hg. Wall   motion was normal; there were no regional wall motion   abnormalities. Doppler  parameters are consistent with abnormal   left ventricular relaxation (grade 1 diastolic dysfunction).   Doppler parameters are consistent with high ventricular filling   pressure. - Aortic valve: Transvalvular velocity was within the normal range.   There was no stenosis. There was moderate regurgitation. Valve   area (VTI): 2.75 cm^2. Valve area (Vmean): 2.43 cm^2. - Mitral valve: Mildly calcified annulus. Mildly thickened leaflets   . Transvalvular velocity was within the normal range. There was   no evidence for stenosis. There was no regurgitation. - Right ventricle: The cavity size was normal. Wall thickness was   normal. Systolic function was normal. - Tricuspid valve: There was no regurgitation.  Laboratory Data:  Chemistry Recent Labs  Lab 08/21/19 1553 08/22/19 0925  NA 135 143  K 4.9 4.3  CL 110 116*  CO2 18* 18*  GLUCOSE 182* 89  BUN 78* 46*  CREATININE 1.53* 1.24*  CALCIUM 11.3* 10.6*  GFRNONAA 29* 37*  GFRAA 34* 43*  ANIONGAP 7 9    Recent Labs  Lab 08/21/19 1553  PROT 6.5  ALBUMIN 3.0*  AST 15  ALT 6  ALKPHOS 47  BILITOT 0.3   Hematology Recent Labs  Lab 08/21/19 1553 08/22/19 0925  08/22/19 1310  WBC 7.2 5.4 5.0  RBC 2.47* 3.93 3.94  HGB 5.4* 10.7* 10.5*  HCT 18.2* 31.5* 32.1*  MCV 73.7* 80.2 81.5  MCH 21.9* 27.2 26.6  MCHC 29.7* 34.0 32.7  RDW 17.1* 17.4* 17.6*  PLT 217 147* 120*   Cardiac EnzymesNo results for input(s): TROPONINI in the last 168 hours. No results for input(s): TROPIPOC in the last 168 hours.  BNPNo results for input(s): BNP, PROBNP in the last 168 hours.  DDimer No results for input(s): DDIMER in the last 168 hours.  Radiology/Studies:  Dg Ribs Unilateral W/chest Left  Result Date: 08/21/2019 CLINICAL DATA:  Fall today from chair at nursing home landing on left side. EXAM: LEFT RIBS AND CHEST - 3+ VIEW COMPARISON:  Chest x-ray 05/02/2019 FINDINGS: Lungs are clear. No evidence of pneumothorax. Cardiomediastinal silhouette is within normal. There are degenerative changes of the spine. No evidence of acute fracture. IMPRESSION: No acute findings. Electronically Signed   By: Marin Olp M.D.   On: 08/21/2019 17:27   Ct Head Wo Contrast  Result Date: 08/21/2019 CLINICAL DATA:  Fall. EXAM: CT HEAD WITHOUT CONTRAST CT CERVICAL SPINE WITHOUT CONTRAST TECHNIQUE: Multidetector CT imaging of the head and cervical spine was performed following the standard protocol without intravenous contrast. Multiplanar CT image reconstructions of the cervical spine were also generated. COMPARISON:  CT head and cervical spine dated Mar 14, 2019. FINDINGS: CT HEAD FINDINGS Brain: No evidence of acute infarction, hemorrhage, hydrocephalus, extra-axial collection or mass lesion/mass effect. Stable atrophy and chronic microvascular ischemic changes. Vascular: Calcified atherosclerosis at the skullbase. No hyperdense vessel. Skull: Negative for fracture or focal lesion. Sinuses/Orbits: No acute finding. Other: Small left forehead scalp hematoma and laceration. CT CERVICAL SPINE FINDINGS Alignment: No traumatic malalignment. Skull base and vertebrae: No acute fracture. No primary  bone lesion or focal pathologic process. Soft tissues and spinal canal: No prevertebral fluid or swelling. No visible canal hematoma. Disc levels: Overall moderate diffuse cervical spondylosis, similar to prior study. Upper chest: Minimal centrilobular emphysema. Other: None. IMPRESSION: 1. No acute intracranial abnormality. Small left forehead scalp hematoma and laceration. 2.  No acute cervical spine fracture. Electronically Signed   By: Titus Dubin M.D.   On: 08/21/2019 15:57   Ct Cervical Spine  Wo Contrast  Result Date: 08/21/2019 CLINICAL DATA:  Fall. EXAM: CT HEAD WITHOUT CONTRAST CT CERVICAL SPINE WITHOUT CONTRAST TECHNIQUE: Multidetector CT imaging of the head and cervical spine was performed following the standard protocol without intravenous contrast. Multiplanar CT image reconstructions of the cervical spine were also generated. COMPARISON:  CT head and cervical spine dated Mar 14, 2019. FINDINGS: CT HEAD FINDINGS Brain: No evidence of acute infarction, hemorrhage, hydrocephalus, extra-axial collection or mass lesion/mass effect. Stable atrophy and chronic microvascular ischemic changes. Vascular: Calcified atherosclerosis at the skullbase. No hyperdense vessel. Skull: Negative for fracture or focal lesion. Sinuses/Orbits: No acute finding. Other: Small left forehead scalp hematoma and laceration. CT CERVICAL SPINE FINDINGS Alignment: No traumatic malalignment. Skull base and vertebrae: No acute fracture. No primary bone lesion or focal pathologic process. Soft tissues and spinal canal: No prevertebral fluid or swelling. No visible canal hematoma. Disc levels: Overall moderate diffuse cervical spondylosis, similar to prior study. Upper chest: Minimal centrilobular emphysema. Other: None. IMPRESSION: 1. No acute intracranial abnormality. Small left forehead scalp hematoma and laceration. 2.  No acute cervical spine fracture. Electronically Signed   By: Titus Dubin M.D.   On: 08/21/2019 15:57     Assessment and Plan:   1. Elevated troponin and EKG changes in patient with acute blood loss anemia: patient presented after a fall c/b head laceration. Hgb found to be 5.4 on arrival. No complaints of bleeding but FOBT was positive. Her EKG revealed TWI in lateral leads which improved after patient received 2 uPRBC. Trop trend [phone]. She has known severe multivessel CAD, turned down for CABG in 2008 with subsequent PCI/DES to LAD. She denies chest pain or SOB. This clearly represents demand ischemia in the setting of profound anemia.  - No further ischemic evaluation at this time - Continue statin - Resume home BBlocker when BP will allow - Plan to restart aspirin once it is clear Hgb is stable and GI bleed resolved.   2. CAD s/p PCI/DES to LAD: multivessel CAD on cath in 2008, turned down for CABG, with subsequent PCI/DES to LAD. No anginal complaints despite Hgb of 5.4 on admission.  - Continue statin - Resume home BBlocker when BP will allow - Plan to restart aspirin once it is clear Hgb is stable and GI bleed resolved.   3. Chronic combined CHF: EF 20-25% in 2008 at the time of MI. Improved to 60-65% on echo 05/2018. No complaints of SOB or LE edema. She appears euvolemic on exam - Monitor volume status closely with IVF resuscitation   4. HTN: BP soft in the setting of anemia - Resume home medications as BP improves    For questions or updates, please contact [place] Please consult [email or web address] for contact info under Cardiology/STEMI.   Signed, Abigail Butts, PA-C  08/22/2019 2:47 PM [phone]

## 2019-08-22 NOTE — Evaluation (Signed)
Physical Therapy Evaluation Patient Details Name: [name] MRN: [id number] DOB: 1924-08-02 Today's Date: 08/22/2019   History of Present Illness  85 yo admitted from SNF after 2 falls with anemia and GIB. PMHx: anemia, breast CA s/p bilat mastectomy, CAD, hx carotid artery aneurysm, CHF, colon CA, DM, GERD, HLD, HTN  Clinical Impression  Pt pleasantly confused on arrival not oriented to situation or time stating she just wants to be able to talk to her family. Pt unaware she was at [facility] and unable to provide assist needed for mobility since recent D/C. Pt moving well with ability to stand and ambulate short distance but limited by incontinence and rapid fatigue with pt unable to successfully regulate activity tolerance. Pt with decreased strength, balance, cognition, transfers and gait who will benefit from acute therapy to maximize function and independence to decrease burden of care and fall risk.      Follow Up Recommendations SNF;Supervision for mobility/OOB    Equipment Recommendations  None recommended by PT    Recommendations for Other Services       Precautions / Restrictions Precautions Precautions: Fall Precaution Comments: incontinent      Mobility  Bed Mobility   Bed Mobility: Supine to Sit;Sit to Supine     Supine to sit: Min guard Sit to supine: Min guard   General bed mobility comments: pt able to transition to sitting from supine with rail without assist with HOB 20 degrees. Return to bed with increased time with bed flat with pt able to bridge and scoot hips to middle of bed  Transfers Overall transfer level: Needs assistance   Transfers: Sit to/from Stand;Stand Pivot Transfers Sit to Stand: Min assist Stand pivot transfers: Min assist       General transfer comment: pt with initial min assist to stand from bed x 2 . Pt then walked with pt fatiguing quickly after incontinence with pt attempting to sit in midair. Max assist to control pelvis and  trunk and pivot pt to nearby chair. Pt then min assist without RW to stand from chair to pivot to bed  Ambulation/Gait Ambulation/Gait assistance: Min assist Gait Distance (Feet): 9 Feet Assistive device: Rolling walker (2 wheeled) Gait Pattern/deviations: Step-through pattern;Decreased stride length;Trunk flexed   Gait velocity interpretation: <1.8 ft/sec, indicate of risk for recurrent falls General Gait Details: pt able to stand with RW present and intiate walking in room. However, 6' away from bed pt with episode of incontinence without ability to reach commode or BSC. Pt with pause in standing to void then turned to return to bed with pt fatiguing rapidly after only an additional 2' requiring max assist to maintain standing with max cues for hand placement and safety and physical assist to reach chair  Stairs            Wheelchair Mobility    Modified Rankin (Stroke Patients Only)       Balance Overall balance assessment: Needs assistance Sitting-balance support: No upper extremity supported;Feet supported Sitting balance-Leahy Scale: Fair Sitting balance - Comments: flexed trunk in sitting with supervision at chair and EOB   Standing balance support: Bilateral upper extremity supported Standing balance-Leahy Scale: Poor Standing balance comment: bil UE support in standing                             Pertinent Vitals/Pain Pain Assessment: No/denies pain    Home Living Family/patient expects to be discharged to:: Skilled nursing  facility Living Arrangements: Children Available Help at Discharge: Family;Personal care attendant;Available PRN/intermittently Type of Home: House Home Access: Stairs to enter   Entrance Stairs-Number of Steps: 7 Home Layout: One level Home Equipment: Walker - 2 wheels;Bedside commode;Wheelchair - manual Additional Comments: personal care attendant comes 6 days a week    Prior Function Level of Independence: Needs assistance    Gait / Transfers Assistance Needed: pt states no one will help her to walk so she normally gets to [facility] unable to recall if she walked at [facility]  ADL's / Homemaking Assistance Needed: caregiver or daughter assist with bathing  Comments: pt unaware she went to SNF and unable to provide updated PLOF from last admission     Hand Dominance        Extremity/Trunk Assessment   Upper Extremity Assessment Upper Extremity Assessment: Generalized weakness    Lower Extremity Assessment Lower Extremity Assessment: Generalized weakness    Cervical / Trunk Assessment Cervical / Trunk Assessment: Kyphotic  Communication   Communication: HOH  Cognition Arousal/Alertness: Awake/alert Behavior During Therapy: WFL for tasks assessed/performed Overall Cognitive Status: Impaired/Different from baseline Area of Impairment: Memory;Following commands;Safety/judgement;Awareness;Problem solving;Orientation                 Orientation Level: Time;Situation   Memory: Decreased short-term memory Following Commands: Follows one step commands inconsistently Safety/Judgement: Decreased awareness of safety;Decreased awareness of deficits Awareness: Emergent Problem Solving: Slow processing;Requires verbal cues;Requires tactile cues General Comments: pt aware she is in the hospital but states she did not fall and does not recall being at [facility]. Pt incontinent without ability for planning for toileting. Pt abruptly trying to sit mid room without awareness of deficits or safety      General Comments      Exercises     Assessment/Plan    PT Assessment Patient needs continued PT services  PT Problem List Decreased strength;Decreased activity tolerance;Decreased balance;Decreased mobility;Pain;Decreased cognition;Decreased knowledge of use of DME;Decreased safety awareness       PT Treatment Interventions DME instruction;Gait training;Functional mobility training;Therapeutic activities;Therapeutic  exercise;Stair training;Balance training;Patient/family education;Neuromuscular re-education    PT Goals (Current goals can be found in the Care Plan section)  Acute Rehab PT Goals Patient Stated Goal: be able to walk around PT Goal Formulation: With patient Time For Goal Achievement: 09/05/19 Potential to Achieve Goals: Fair    Frequency Min 3X/week   Barriers to discharge Decreased caregiver support unsure of family assist    Co-evaluation               AM-PAC PT "6 Clicks" Mobility  Outcome Measure Help needed turning from your back to your side while in a flat bed without using bedrails?: A Little Help needed moving from lying on your back to sitting on the side of a flat bed without using bedrails?: A Little Help needed moving to and from a bed to a chair (including a wheelchair)?: A Little Help needed standing up from a chair using your arms (e.g., wheelchair or bedside chair)?: A Little Help needed to walk in hospital room?: A Lot Help needed climbing 3-5 steps with a railing? : Total 6 Click Score: 15    End of Session Equipment Utilized During Treatment: Gait belt Activity Tolerance: Patient tolerated treatment well Patient left: in bed;with call bell/phone within reach;with bed alarm set(pt returned to bed for EKG) Nurse Communication: Mobility status PT Visit Diagnosis: Unsteadiness on feet (R26.81);Muscle weakness (generalized) (M62.81);Difficulty in walking, not elsewhere classified (R26.2)    Time:  [id number] PT Time Calculation (min) (ACUTE ONLY): 18 min   Charges:   PT Evaluation $PT Eval Moderate Complexity: 1 Mod          [place], PT Acute Rehabilitation Services Pager: [phone] Office: [phone]   Ajia Chadderdon B Cherylann Hobday 08/22/2019, 1:13 PM

## 2019-08-22 NOTE — Patient Outreach (Signed)
Late entry for 08/21/19  Member was assessed for potential [facility] Care Management needs as a benefit of  [place] Medicare.  Member was receiving rehab therapy at [facility] SNF on 08/21/19.  Member was discussed in weekly telephonic IDT meeting with facility staff, [facility] UM team, and writer on 08/21/19.  Discussed disposition plans and goals of care needed to be clarified with family. Facility reported at the time that they were speaking with daughter [name].  Writer intended to outreach to family to discuss [facility] follow up and disposition plans. However, she has been readmitted to hospital.  Notifications sent to [facility] liaisons and notification received from [place].  Will plan to follow member if she returns to SNF.   Marthenia Rolling, MSN-Ed, RN,BSN [place] Acute Care Coordinator [phone] [facility]) [phone]  (Toll free office)

## 2019-08-22 NOTE — Progress Notes (Signed)
Family Medicine Teaching Service Daily Progress Note Intern Pager: [phone]  [name] Medical record number: [id number] Date of birth: Jun 28, 1924 Age: 85 y.o. Gender: female  Primary Care Provider: Danna Hefty, DO Consultants: Cardio Code Status: DNR   Pt Overview and Major Events to Date:  10/8- admitted for severe anemia  Assessment and Plan: [name] is a 85 y.o. female presenting with anemia after 2 falls one in which she sustained a head laceration that has been repaired. PMH is significant for anemia, breast CA s/p bilat mastectomy, CAD, hx carotid artery aneurysm, CHF, colon CA, DM, GERD, HLD, HTN.  Severe Anemia 2/2 to GI Bleed Hx of IDA  Patient with uneventful night.  Vital signs stable with soft blood pressures. Was transfused 2 units PRBCs.  Hemoglobin 10.7 posttransfusion on exam -Transfusion threshold less than 8 -Continue cardiac monitoring -Continue Protonix 40 mg twice daily -Vital signs every 4 -No GI referral at this time -Monitor for any signs of bleeding -CBC daily  Recent Fall  No falls overnight.  On exam patient having no complaints of pain. -Fall precautions -PT OT for eval  AKI Patient has not voided since admission. Cr 1.24  Last reported creatinine 1.53. Baseline less than 1 -Continue IV fluids -Follow-up a.m. creatinine  Dementia  Patient remains at baseline and orientated to self and situation -Continue Aricept nightly -Reorientation as needed  T2DM  Patient currently n.p.o. blood glucose 182 yesterday -CBG as needed  Asthma  Respiration rate overnight 13- 19.  On exam no increased work of breathing.  No shortness of breath.  No wheezing.  No PRN albuterol given -Albuterol PRN   Tobacco Use Disorder  -Encourage smoking cessation.   HTN  BP overnight 90s- 130s/30s- 60s.  No PRN's medication given -Continue to monitor blood pressure   HFpEF Patient has not voided since admission.  On exam  no lower extremity edema noted. -Daily weight -Strict I's and O's -Continue to call cardiac   CAD  Patient with chest pain -Follow-up a.m. troponin -Continue Crestor -Hold ASA  Insomnia Patient slept well overnight -Continue to hold trazodone   GERD  Symptomatic -Continue Protonix 40 twice daily  Protein calorie malnutrition  Albumin 3.0 on admission. Takes supplements at home  -nutrition consulted  FEN/GI:  -Regular diet Prophylaxis: -SCDs    Disposition: Family wants patient discharged home when medically stable just 24-hour supervision  Subjective:  Patient resting comfortably in bed.  No complaints voiced.  Will update family patient's condition. Objective: Temp:  [97.9 F (36.6 C)-99.1 F (37.3 C)] 99.1 F (37.3 C) (10/09 0145) Pulse Rate:  [79-105] 83 (10/09 0315) Resp:  [12-23] 15 (10/09 0315) BP: (81-143)/(29-70) 111/33 (10/09 0315) SpO2:  [92 %-100 %] 98 % (10/09 0315) Weight:  [49 kg] 49 kg (10/08 1448) Physical Exam: General: Sleeping but easy to arouse, in no acute distress Cardiovascular: Regular rate and rhythm, no murmurs appreciated, no gallops, no rubs Respiratory: Chest clear to auscultation.  No crackles, no rhonchi Abdomen: Soft, nontender, nondistended, bowel sounds present Extremities: Moving all extremities, no lower extremity edema.  Laboratory: Recent Labs  Lab 08/21/19 1553  WBC 7.2  HGB 5.4*  HCT 18.2*  PLT 217   Recent Labs  Lab 08/21/19 1553  NA 135  K 4.9  CL 110  CO2 18*  BUN 78*  CREATININE 1.53*  CALCIUM 11.3*  PROT 6.5  BILITOT 0.3  ALKPHOS 47  ALT 6  AST 15  GLUCOSE 182*  Imaging/Diagnostic Tests: Dg Ribs Unilateral W/chest Left  Result Date: 08/21/2019 CLINICAL DATA:  Fall today from chair at nursing home landing on left side. EXAM: LEFT RIBS AND CHEST - 3+ VIEW COMPARISON:  Chest x-ray 05/02/2019 FINDINGS: Lungs are clear. No evidence of pneumothorax. Cardiomediastinal silhouette is  within normal. There are degenerative changes of the spine. No evidence of acute fracture. IMPRESSION: No acute findings. Electronically Signed   By: [name] M.D.   On: 08/21/2019 17:27    [name] Leitz, MD 08/22/2019, 6:01 AM PGY-1, [place] Intern pager: [phone], text pages welcome

## 2019-08-23 ENCOUNTER — Inpatient Hospital Stay (HOSPITAL_COMMUNITY): Payer: Medicare Other

## 2019-08-23 DIAGNOSIS — Z66 Do not resuscitate: Secondary | ICD-10-CM | POA: Diagnosis present

## 2019-08-23 DIAGNOSIS — R2689 Other abnormalities of gait and mobility: Secondary | ICD-10-CM | POA: Diagnosis not present

## 2019-08-23 DIAGNOSIS — K922 Gastrointestinal hemorrhage, unspecified: Secondary | ICD-10-CM | POA: Diagnosis not present

## 2019-08-23 DIAGNOSIS — R4182 Altered mental status, unspecified: Secondary | ICD-10-CM | POA: Diagnosis not present

## 2019-08-23 DIAGNOSIS — R41841 Cognitive communication deficit: Secondary | ICD-10-CM | POA: Diagnosis not present

## 2019-08-23 DIAGNOSIS — E118 Type 2 diabetes mellitus with unspecified complications: Secondary | ICD-10-CM | POA: Diagnosis not present

## 2019-08-23 DIAGNOSIS — M6281 Muscle weakness (generalized): Secondary | ICD-10-CM | POA: Diagnosis not present

## 2019-08-23 DIAGNOSIS — W07XXXA Fall from chair, initial encounter: Secondary | ICD-10-CM | POA: Diagnosis present

## 2019-08-23 DIAGNOSIS — F0390 Unspecified dementia without behavioral disturbance: Secondary | ICD-10-CM | POA: Diagnosis present

## 2019-08-23 DIAGNOSIS — M2550 Pain in unspecified joint: Secondary | ICD-10-CM | POA: Diagnosis not present

## 2019-08-23 DIAGNOSIS — Z23 Encounter for immunization: Secondary | ICD-10-CM | POA: Diagnosis not present

## 2019-08-23 DIAGNOSIS — I5042 Chronic combined systolic (congestive) and diastolic (congestive) heart failure: Secondary | ICD-10-CM | POA: Diagnosis present

## 2019-08-23 DIAGNOSIS — S0181XA Laceration without foreign body of other part of head, initial encounter: Secondary | ICD-10-CM | POA: Diagnosis present

## 2019-08-23 DIAGNOSIS — N179 Acute kidney failure, unspecified: Secondary | ICD-10-CM | POA: Diagnosis present

## 2019-08-23 DIAGNOSIS — E876 Hypokalemia: Secondary | ICD-10-CM | POA: Diagnosis present

## 2019-08-23 DIAGNOSIS — D649 Anemia, unspecified: Secondary | ICD-10-CM | POA: Diagnosis not present

## 2019-08-23 DIAGNOSIS — Z853 Personal history of malignant neoplasm of breast: Secondary | ICD-10-CM | POA: Diagnosis not present

## 2019-08-23 DIAGNOSIS — E1140 Type 2 diabetes mellitus with diabetic neuropathy, unspecified: Secondary | ICD-10-CM | POA: Diagnosis present

## 2019-08-23 DIAGNOSIS — R1311 Dysphagia, oral phase: Secondary | ICD-10-CM | POA: Diagnosis not present

## 2019-08-23 DIAGNOSIS — D62 Acute posthemorrhagic anemia: Secondary | ICD-10-CM | POA: Diagnosis present

## 2019-08-23 DIAGNOSIS — R55 Syncope and collapse: Secondary | ICD-10-CM

## 2019-08-23 DIAGNOSIS — Z681 Body mass index (BMI) 19 or less, adult: Secondary | ICD-10-CM | POA: Diagnosis not present

## 2019-08-23 DIAGNOSIS — R64 Cachexia: Secondary | ICD-10-CM | POA: Diagnosis present

## 2019-08-23 DIAGNOSIS — K449 Diaphragmatic hernia without obstruction or gangrene: Secondary | ICD-10-CM | POA: Diagnosis present

## 2019-08-23 DIAGNOSIS — R278 Other lack of coordination: Secondary | ICD-10-CM | POA: Diagnosis not present

## 2019-08-23 DIAGNOSIS — K921 Melena: Secondary | ICD-10-CM | POA: Diagnosis present

## 2019-08-23 DIAGNOSIS — K219 Gastro-esophageal reflux disease without esophagitis: Secondary | ICD-10-CM | POA: Diagnosis present

## 2019-08-23 DIAGNOSIS — R569 Unspecified convulsions: Secondary | ICD-10-CM | POA: Diagnosis not present

## 2019-08-23 DIAGNOSIS — Y92129 Unspecified place in nursing home as the place of occurrence of the external cause: Secondary | ICD-10-CM | POA: Diagnosis not present

## 2019-08-23 DIAGNOSIS — I248 Other forms of acute ischemic heart disease: Secondary | ICD-10-CM | POA: Diagnosis present

## 2019-08-23 DIAGNOSIS — I110 Hypertensive heart disease with heart failure: Secondary | ICD-10-CM | POA: Diagnosis present

## 2019-08-23 DIAGNOSIS — Z7401 Bed confinement status: Secondary | ICD-10-CM | POA: Diagnosis not present

## 2019-08-23 DIAGNOSIS — E785 Hyperlipidemia, unspecified: Secondary | ICD-10-CM | POA: Diagnosis present

## 2019-08-23 DIAGNOSIS — F0391 Unspecified dementia with behavioral disturbance: Secondary | ICD-10-CM | POA: Diagnosis present

## 2019-08-23 DIAGNOSIS — R2681 Unsteadiness on feet: Secondary | ICD-10-CM | POA: Diagnosis not present

## 2019-08-23 DIAGNOSIS — Z9013 Acquired absence of bilateral breasts and nipples: Secondary | ICD-10-CM | POA: Diagnosis not present

## 2019-08-23 DIAGNOSIS — Z85038 Personal history of other malignant neoplasm of large intestine: Secondary | ICD-10-CM | POA: Diagnosis not present

## 2019-08-23 DIAGNOSIS — I2510 Atherosclerotic heart disease of native coronary artery without angina pectoris: Secondary | ICD-10-CM | POA: Diagnosis present

## 2019-08-23 DIAGNOSIS — Z20828 Contact with and (suspected) exposure to other viral communicable diseases: Secondary | ICD-10-CM | POA: Diagnosis present

## 2019-08-23 DIAGNOSIS — E46 Unspecified protein-calorie malnutrition: Secondary | ICD-10-CM | POA: Diagnosis present

## 2019-08-23 LAB — BASIC METABOLIC PANEL
Anion gap: 6 (ref 5–15)
Anion gap: 8 (ref 5–15)
BUN: 26 mg/dL — ABNORMAL HIGH (ref 8–23)
BUN: 24 mg/dL — ABNORMAL HIGH (ref 8–23)
CO2: 17 mmol/L — ABNORMAL LOW (ref 22–32)
CO2: 16 mmol/L — ABNORMAL LOW (ref 22–32)
Calcium: 10 mg/dL (ref 8.9–10.3)
Calcium: 9.9 mg/dL (ref 8.9–10.3)
Chloride: 116 mmol/L — ABNORMAL HIGH (ref 98–111)
Chloride: 115 mmol/L — ABNORMAL HIGH (ref 98–111)
Creatinine, Ser: 1.05 mg/dL — ABNORMAL HIGH (ref 0.44–1.00)
Creatinine, Ser: 1.03 mg/dL — ABNORMAL HIGH (ref 0.44–1.00)
GFR calc Af Amer: 53 mL/min — ABNORMAL LOW (ref >60)
GFR calc Af Amer: 54 mL/min — ABNORMAL LOW (ref >60)
GFR calc non Af Amer: 46 mL/min — ABNORMAL LOW (ref >60)
GFR calc non Af Amer: 47 mL/min — ABNORMAL LOW (ref >60)
Glucose, Bld: 77 mg/dL (ref 70–99)
Glucose, Bld: 106 mg/dL — ABNORMAL HIGH (ref 70–99)
Potassium: 3.6 mmol/L (ref 3.5–5.1)
Potassium: 3.5 mmol/L (ref 3.5–5.1)
Sodium: 139 mmol/L (ref 135–145)
Sodium: 139 mmol/L (ref 135–145)

## 2019-08-23 LAB — CBC
HCT: 29.8 % — ABNORMAL LOW (ref 36.0–46.0)
HCT: 28.1 % — ABNORMAL LOW (ref 36.0–46.0)
HCT: 33.4 % — ABNORMAL LOW (ref 36.0–46.0)
Hemoglobin: 10.2 g/dL — ABNORMAL LOW (ref 12.0–15.0)
Hemoglobin: 9.6 g/dL — ABNORMAL LOW (ref 12.0–15.0)
Hemoglobin: 11.7 g/dL — ABNORMAL LOW (ref 12.0–15.0)
MCH: 27.2 pg (ref 26.0–34.0)
MCH: 27 pg (ref 26.0–34.0)
MCH: 27.1 pg (ref 26.0–34.0)
MCHC: 34.2 g/dL (ref 30.0–36.0)
MCHC: 34.2 g/dL (ref 30.0–36.0)
MCHC: 35 g/dL (ref 30.0–36.0)
MCV: 79.5 fL — ABNORMAL LOW (ref 80.0–100.0)
MCV: 78.9 fL — ABNORMAL LOW (ref 80.0–100.0)
MCV: 77.3 fL — ABNORMAL LOW (ref 80.0–100.0)
Platelets: 149 10*3/uL — ABNORMAL LOW (ref 150–400)
Platelets: 135 10*3/uL — ABNORMAL LOW (ref 150–400)
Platelets: UNDETERMINED 10*3/uL (ref 150–400)
RBC: 3.75 MIL/uL — ABNORMAL LOW (ref 3.87–5.11)
RBC: 3.56 MIL/uL — ABNORMAL LOW (ref 3.87–5.11)
RBC: 4.32 MIL/uL (ref 3.87–5.11)
RDW: 17.9 % — ABNORMAL HIGH (ref 11.5–15.5)
RDW: 17.9 % — ABNORMAL HIGH (ref 11.5–15.5)
RDW: 17.8 % — ABNORMAL HIGH (ref 11.5–15.5)
WBC: 5.6 10*3/uL (ref 4.0–10.5)
WBC: 5.3 10*3/uL (ref 4.0–10.5)
WBC: 6.6 10*3/uL (ref 4.0–10.5)
nRBC: 0.7 % — ABNORMAL HIGH (ref 0.0–0.2)
nRBC: 0.4 % — ABNORMAL HIGH (ref 0.0–0.2)
nRBC: 0.3 % — ABNORMAL HIGH (ref 0.0–0.2)

## 2019-08-23 LAB — TROPONIN I (HIGH SENSITIVITY)
Troponin I (High Sensitivity): 265 ng/L (ref <18)
Troponin I (High Sensitivity): 288 ng/L (ref <18)
Troponin I (High Sensitivity): 282 ng/L (ref <18)
Troponin I (High Sensitivity): 244 ng/L (ref <18)

## 2019-08-23 LAB — TYPE AND SCREEN
ABO/RH(D): B POS
Antibody Screen: NEGATIVE
Unit division: 0
Unit division: 0

## 2019-08-23 LAB — BPAM RBC
Blood Product Expiration Date: 202010102359
Blood Product Expiration Date: 202010302359
ISSUE DATE / TIME: 202010082157
ISSUE DATE / TIME: 202010090138
Unit Type and Rh: 7300
Unit Type and Rh: 7300

## 2019-08-23 LAB — GLUCOSE, CAPILLARY: Glucose-Capillary: 101 mg/dL — ABNORMAL HIGH (ref 70–99)

## 2019-08-23 MED ORDER — LEVETIRACETAM ER 500 MG PO TB24
500.0000 mg | ORAL_TABLET | Freq: Two times a day (BID) | ORAL | Status: DC
  Administered 2019-08-23 – 2019-08-24 (×2): 500 mg via ORAL
  Filled 2019-08-23 (×3): qty 1

## 2019-08-23 MED ORDER — PANTOPRAZOLE SODIUM 40 MG PO TBEC: 40.0000 mg | DELAYED_RELEASE_TABLET | Freq: Two times a day (BID) | ORAL | 1 refills | Status: DC

## 2019-08-23 MED ORDER — SODIUM CHLORIDE 0.9 % IV SOLN
750.0000 mg | Freq: Once | INTRAVENOUS | Status: AC
  Administered 2019-08-23: 750 mg via INTRAVENOUS
  Filled 2019-08-23: qty 7.5

## 2019-08-23 MED ORDER — FAMOTIDINE 20 MG PO TABS: 10.0000 mg | ORAL_TABLET | Freq: Every day | ORAL | Status: DC

## 2019-08-23 MED ORDER — ACETAMINOPHEN 500 MG PO TABS
1000.0000 mg | ORAL_TABLET | Freq: Four times a day (QID) | ORAL | Status: DC | PRN
  Administered 2019-08-23: 1000 mg via ORAL
  Filled 2019-08-23: qty 2

## 2019-08-23 MED ORDER — CALCIUM CARBONATE ANTACID 500 MG PO CHEW
2.0000 | CHEWABLE_TABLET | Freq: Three times a day (TID) | ORAL | Status: DC | PRN
  Administered 2019-08-23: 400 mg via ORAL
  Filled 2019-08-23: qty 2

## 2019-08-23 MED ORDER — PANTOPRAZOLE SODIUM 40 MG PO TBEC
40.0000 mg | DELAYED_RELEASE_TABLET | Freq: Two times a day (BID) | ORAL | Status: DC
  Administered 2019-08-23 – 2019-08-24 (×3): 40 mg via ORAL
  Filled 2019-08-23 (×3): qty 1

## 2019-08-23 NOTE — TOC Progression Note (Addendum)
Transition of Care [facility]) - Progression Note    Patient Details  Name: [name] MRN: [id number] Date of Birth: 1923-11-20  Transition of Care [facility]) CM/SW [place], [place] Phone Number: 08/23/2019, 10:42 AM  Clinical Narrative:     CSW received a return phone call from the patient's daughter. They are agreeable to SNF. They would like [facility] or [facility]. CSW faxed the patient out and is awaiting bed offers.   The patient is medically ready for discharge and we are awaiting new bed offers.  CSW will continue to follow and assist with discharge planning.   Expected Discharge Plan: Skilled Nursing Facility Barriers to Discharge: SNF Pending bed offer  Expected Discharge Plan and Services Expected Discharge Plan: [place] In-house Referral: Clinical Social Work Discharge Planning Services: NA Post Acute Care Choice: [place] Living arrangements for the past 2 months: [place], [place] Expected Discharge Date: 08/23/19               DME Arranged: N/A DME Agency: NA                   Social Determinants of Health (SDOH) Interventions    Readmission Risk Interventions Readmission Risk Prevention Plan 08/12/2019 01/25/2019  Transportation Screening Complete -  PCP or Specialist Appt within 3-5 Days Not Complete Complete  Not Complete comments Will transition to a SNF -  [place] or [place] Complete Complete  Social Work Consult for [place] Planning/Counseling Complete -  Palliative Care Screening Not Applicable Not Applicable  Medication Review Press photographer) Complete Complete  Some recent data might be hidden

## 2019-08-23 NOTE — Progress Notes (Signed)
Family Medicine Teaching Service Daily Progress Note Intern Pager: [phone]  [name] Medical record number: [id number] Date of birth: 1924/06/21 Age: 85 y.o. Gender: female  Primary Care Provider: Danna Hefty, DO Consultants: Cardiology  Code Status: DNR  Pt Overview and Major Events to Date:  Admitted to [facility] on 10/8 2U PRBCs on 10/8  Assessment and Plan: LUN BREIT a 85 y.o.femalepresenting with anemia after 2 falls one in which she sustained a head laceration that has been repaired. PMH is significant foranemia, breast CA s/p bilat mastectomy, CAD,hxcarotid artery aneurysm, CHF, colon CA, DM, GERD, HLD, HTN.  Severe Anemia 2/2 to GI Bleed Hx of IDA Vital signs showing improved HR to 81 and BP 136/55.  Patient is status post 2 units packed red blood cells.  Patient's hemoglobin went up from 5.4-10.7 s/p 2U PRBCs.  Today's hemoglobin 9.6.  Overall patient is well-appearing this morning.  She does not want any invasive procedures.  Therefore GI was not consulted.  Patient's daughter who is healthcare power of attorney agreed with this decision as well. -Continue cardiac monitoring -Continue Protonix 40 mg twice daily -Monitor for any signs of bleeding -Monitor Hgb on daily CBC  Elevated troponins Trops trended at 189>217>303>265>288.  Etiology consulted and felt that troponin elevation was secondary to demand ischemia from profound anemia in the setting of CAD.  Cardiology recommended holding aspirin until hemoglobin normalizes. -Continue to trend troponins -Cardiology consulted appreciate recommendations: have signed off   Recent Fall  Physical therapy evaluated patient yesterday and recommended SNF placement. -Fall precautions -PT/OT for eval  AKI-resolved Cr 1.03.   Baseline less than 1 -Continue IV fluids -monitor on daily BMP  Dementia  Patient remains at baseline and orientated to self and situation -Continue Aricept    T2DM Glucose on BMP 106 -continue to monitor   Asthma  Home meds: albuterol  -Albuterol PRN  Tobacco Use Disorder  Patient reported to have last used dip tobacco 1 month ago. -Encourage smoking cessation.  HTN  Normotensive. BP 136/55. Home antihypertensives include coreg, amlodipine 5mg , Cozaar 50mg , losartan 50mg    -add home meds as BP tolerates   HFpEF Euvolemic on exam. Home medication include Coreg 12 -Daily weight -Strict I's and O's -Continue to call cardiac  CAD  -Continue Crestor -Hold ASA  Insomnia Patient slept well overnight -Continue to hold trazodone   GERD  Symptomatic -Continue Protonix 40 twice daily  Protein calorie malnutrition  Albumin 3.0 on admission. Takes supplements at home  -nutrition consulted  FEN/GI: Regular diet  PPx: SCDs  Disposition: continued inpatient stay to trend troponins   Subjective:  Patient reports she feels well today.  Denies any chest pain or shortness of breath.  States that she is hoping to go home with her daughter.  Patient states that she feels significantly improved since getting blood.  Objective: Temp:  [98.2 F (36.8 C)-98.5 F (36.9 C)] 98.2 F (36.8 C) (10/09 2348) Pulse Rate:  [74-89] 84 (10/09 2348) Resp:  [12-19] 19 (10/09 2348) BP: (136-168)/(46-71) 136/55 (10/09 2348) SpO2:  [97 %-100 %] 97 % (10/09 2348) Physical Exam: General: Awake and alert, laying comfortably in bed, no apparent distress Cardiovascular: Regular rate and rhythm, no murmurs rubs or gallops Respiratory: Clear to auscultation bilaterally, no wheezing, rales, rhonchi Abdomen: Soft, nontender, nondistended, bowel sounds normal Extremities: No edema, nontender  Laboratory: Recent Labs  Lab 08/22/19 1310 08/23/19 0135 08/23/19 0346  WBC 5.0 5.6 5.3  HGB 10.5* 10.2* 9.6*  HCT 32.1* 29.8* 28.1*  PLT 120* 149* 135*   Recent Labs  Lab 08/21/19 1553 08/22/19 0925 08/23/19 0135 08/23/19 0346  NA 135 143  139 139  K 4.9 4.3 3.6 3.5  CL 110 116* 116* 115*  CO2 18* 18* 17* 16*  BUN 78* 46* 26* 24*  CREATININE 1.53* 1.24* 1.05* 1.03*  CALCIUM 11.3* 10.6* 10.0 9.9  PROT 6.5  --   --   --   BILITOT 0.3  --   --   --   ALKPHOS 47  --   --   --   ALT 6  --   --   --   AST 15  --   --   --   GLUCOSE 182* 89 77 106*    Ref. Range 08/22/2019 20:57 08/22/2019 23:15 08/23/2019 01:35  Troponin I (High Sensitivity) Latest Ref Range: <18 ng/L 303 (HH) 265 (HH) 288 (HH)    Imaging/Diagnostic Tests: No new imaging   [name], DO 08/23/2019, 6:09 AM PGY-3, [place] Intern pager: [phone], text pages welcome

## 2019-08-23 NOTE — Significant Event (Addendum)
Rapid Response Event Note  Overview: Neurologic - Seizure Like Presentation  Initial Focused Assessment: Received a call from nursing staff with concerns of patient having seizure like episodes, per nurse, the initial episode occurred while the nurse was talking to the patient. Per staff, paitent was talking and then mid sentence, stopped talking, eyes rolled back, and then after a few seconds, she came too. When I arrived, patient was shaking/tremoring - BUE, but she tracked me across the room, then after a few seconds, patient was alert and oriented  X 2. She endorses having back pain and stated that she needs pain medicine. Awake, A/O x 2, able to follow commands, MOE X 4, no drift or sensory loss. + facial weakness however uncertain if this old or new. Blood sugar 101. Skin warm and dry, not in acute distress.   Interventions: -- None --  Plan of Care: -- FMTS paged by primary nurse.  -- Rest to be determined.   Event Summary:  Call Time [place]  Aaniya Sterba R

## 2019-08-23 NOTE — Progress Notes (Signed)
Went to evaluate patient after being paged about possible seizure-like activity.  Found patient in no apparent distress.  Upon talking to patient she had an event where she stopped talking and stared at me for approximately 15 seconds.  During that time she was not responsive to shaking, verbal stimulation, or pain via pinching her thumb.  This resolved spontaneously, and she began speaking again.  She did not remember having an event.  Per her nurse her blood sugar was checked and she was not hypoglycemic.  General: No apparent distress Neuro: Alert and oriented to person and place, not to date or current president.  Pupils equal round and reactive to light.  Patient uncooperative with extraocular movements.  When smiling left side of patient's face does not raise, though she states this is normal for her.  Fine touch sensation intact and equal bilaterally in face upper and lower limbs.  Strength 5/5 in elbow flexors/extensors and grip.  Strength 4/5 in left lower limb as well as plantar and dorsiflexion of the left side, 5/5 on right side.  Patient states this lack of strength on her left side is normal for her.  Plan: -Neurology consult -Head CT -Repeat CBC

## 2019-08-23 NOTE — Plan of Care (Signed)

## 2019-08-23 NOTE — Discharge Instructions (Signed)
You were admitted for a GI bleeding. Please follow up with your PCP on 08/27/19. If you notice blood in your stool or emesis please call your doctor immediatly or go to the ER. If you are dizzy or fall please call your doctor or go to the ER.   We stopped your aspirin and your blood pressure medications, your PCP will re-start those when they feel your hemoglobin can tolerate/your blood pressure can tolerate.   If you have any questions after discharge please call your PCP's office.

## 2019-08-23 NOTE — Evaluation (Signed)
Occupational Therapy Evaluation Patient Details Name: [name] MRN: [id number] DOB: [birth year] Today's Date: 08/23/2019    History of Present Illness 85 yo admitted from SNF after 2 falls with anemia and GIB. PMHx: anemia, breast CA s/p bilat mastectomy, CAD, hx carotid artery aneurysm, CHF, colon CA, DM, GERD, HLD, HTN   Clinical Impression   Pt PTA: pt living with family. Pt currently limited by poor activity tolerance to perform own ADLs, decreased strength and decreased mobility. Pt minA for UB ADL and modA for LB ADL; today, pt maxA for sit to stand as pt attempting to lean over for pushing off, but unable to due to decreased strength. Pt requires continued OT in order to increase independence with ADL, mobility and safety in SNF setting. OT following acutely for needs listed above.      Follow Up Recommendations  SNF    Equipment Recommendations  None recommended by OT    Recommendations for Other Services       Precautions / Restrictions Precautions Precautions: Fall Precaution Comments: incontinent Restrictions Weight Bearing Restrictions: No      Mobility Bed Mobility Overal bed mobility: Needs Assistance             General bed mobility comments: sitting in recliner upon arrival  Transfers Overall transfer level: Needs assistance   Transfers: Sit to/from Stand Sit to Stand: Max assist         General transfer comment: MaxA for sit to stand with knees continuing to be flexed and declined using RW    Balance Overall balance assessment: Needs assistance   Sitting balance-Leahy Scale: Fair       Standing balance-Leahy Scale: Poor                             ADL either performed or assessed with clinical judgement   ADL Overall ADL's : Needs assistance/impaired Eating/Feeding: Set up;Sitting   Grooming: Supervision/safety;Set up;Sitting   Upper Body Bathing: Minimal assistance;Sitting   Lower Body Bathing: Moderate  assistance   Upper Body Dressing : Minimal assistance;Sitting   Lower Body Dressing: Moderate assistance;Sitting/lateral leans;Sit to/from stand   Toilet Transfer: Maximal assistance;BSC   Toileting- Clothing Manipulation and Hygiene: Maximal assistance;+2 for physical assistance;+2 for safety/equipment;Sit to/from stand       Functional mobility during ADLs: Maximal assistance General ADL Comments: Pt limited by poor activity tolerance, decreased strength and decreased mobility.     Vision Baseline Vision/History: No visual deficits Vision Assessment?: No apparent visual deficits     Perception     Praxis      Pertinent Vitals/Pain Pain Assessment: No/denies pain     Hand Dominance     Extremity/Trunk Assessment Upper Extremity Assessment Upper Extremity Assessment: Generalized weakness   Lower Extremity Assessment Lower Extremity Assessment: Generalized weakness   Cervical / Trunk Assessment Cervical / Trunk Assessment: Kyphotic   Communication Communication Communication: HOH   Cognition Arousal/Alertness: Awake/alert Behavior During Therapy: WFL for tasks assessed/performed Overall Cognitive Status: No family/caregiver present to determine baseline cognitive functioning Area of Impairment: Safety/judgement;Awareness;Orientation                 Orientation Level: Disoriented to;Time;Situation   Memory: Decreased short-term memory Following Commands: Follows one step commands inconsistently Safety/Judgement: Decreased awareness of safety;Decreased awareness of deficits Awareness: Emergent Problem Solving: Slow processing;Requires verbal cues;Requires tactile cues     General Comments       Exercises  Shoulder Instructions      Home Living Family/patient expects to be discharged to:: Skilled nursing facility Living Arrangements: Children Available Help at Discharge: Family;Personal care attendant;Available PRN/intermittently Type of Home:  House Home Access: Stairs to enter [employer] of Steps: 7 Entrance Stairs-Rails: Right;Left Home Layout: One level         Biochemist, clinical: Standard     Home Equipment: Environmental consultant - 2 wheels;Bedside commode;Wheelchair - manual   Additional Comments: personal care attendant comes 6 days a week      Prior Functioning/Environment Level of Independence: Needs assistance  Gait / Transfers Assistance Needed: unable to state ADL's / Homemaking Assistance Needed: caregiver or daughter assist with bathing            OT Problem List: Decreased strength;Impaired balance (sitting and/or standing);Decreased activity tolerance      OT Treatment/Interventions: Self-care/ADL training;Therapeutic exercise;DME and/or AE instruction;Energy conservation;Balance training;Therapeutic activities;Patient/family education    OT Goals(Current goals can be found in the care plan section) Acute Rehab OT Goals Patient Stated Goal: to be able to walk OT Goal Formulation: With patient Time For Goal Achievement: 09/06/19 Potential to Achieve Goals: Good ADL Goals Pt Will Perform Grooming: with modified independence;sitting Pt Will Transfer to Toilet: with supervision;stand pivot transfer;bedside commode Additional ADL Goal #1: Pt will increase to supervisionA for bed mobility  OT Frequency: Min 2X/week   Barriers to D/C: Decreased caregiver support  requires hands on physical support       Co-evaluation              AM-PAC OT "6 Clicks" Daily Activity     Outcome Measure Help from another person eating meals?: A Little Help from another person taking care of personal grooming?: A Little Help from another person toileting, which includes using toliet, bedpan, or urinal?: A Lot Help from another person bathing (including washing, rinsing, drying)?: A Lot Help from another person to put on and taking off regular upper body clothing?: A Little Help from another person to put on and  taking off regular lower body clothing?: A Lot 6 Click Score: 15   End of Session Equipment Utilized During Treatment: Gait belt Nurse Communication: Mobility status  Activity Tolerance: Patient limited by lethargy Patient left: in chair;with call bell/phone within reach;with chair alarm set  OT Visit Diagnosis: Unsteadiness on feet (R26.81);Other abnormalities of gait and mobility (R26.89);Muscle weakness (generalized) (M62.81);Pain                Time: [id number] OT Time Calculation (min): 17 min Charges:  OT General Charges $OT Visit: 1 Visit OT Evaluation $OT Eval Moderate Complexity: 1 Mod  Darryl Nestle) Marsa Aris OTR/L Acute Rehabilitation Services Pager: [phone] Office: [phone]   Audie Pinto 08/23/2019, 2:34 PM

## 2019-08-23 NOTE — TOC Initial Note (Signed)
Transition of Care [facility]) - Initial/Assessment Note    Patient Details  Name: [name] MRN: [id number] Date of Birth: [birth year]  Transition of Care [facility]) CM/SW Contact:    Gelene Mink, [place] Phone Number: 08/23/2019, 10:12 AM  Clinical Narrative:                  CSW called and spoke with the patient's daughter, [name]. She is the patient's primary care taker. CSW called her and introduced herself. CSW explained her role and shared the therapy recommendation. The patient arrived from [facility] and they are able to accept her back. The patient's daughter, is hesitant to send the patient back there because she fell twice. She stated that she would like to bring her home and pursue home health. CSW asked if she had support and explained that should she decide to need rehab, then insurance will not cover it. The patient has a home health aide that comes out 7 days a week to assist with bathing. The patient's daughter takes her to her appointments, makes her meals, and provides assistance with medication. The patient has a wheelchair, walker, and bedside commode at home. The patient's daughter wanted to speak with her family regarding home health versus SNF.   [name] stated that she would contact the CSW back with the families decision. CSW will continue to follow and assist with disposition planning. The patient is medically ready for discharge.   Expected Discharge Plan: Skilled Nursing Facility(or Home Health) Barriers to Discharge: Other (comment)(Family to make a decision)   Patient Goals and CMS Choice Patient states their goals for this hospitalization and ongoing recovery are:: Daughter wants her to come home   Choice offered to / list presented to : Adult Children  Expected Discharge Plan and Services Expected Discharge Plan: Skilled Nursing Facility(or Home Health) In-house Referral: Clinical Social Work Discharge Planning Services: NA   Living  arrangements for the past 2 months: [place], [place] Expected Discharge Date: 08/23/19               DME Arranged: N/A DME Agency: NA                  Prior Living Arrangements/Services Living arrangements for the past 2 months: [place], [place] Lives with:: Adult Children Patient language and need for interpreter reviewed:: No Do you feel safe going back to the place where you live?: Yes      Need for Family Participation in Patient Care: Yes (Comment) Care giver support system in place?: Yes (comment) Current home services: Homehealth aide Criminal Activity/Legal Involvement Pertinent to Current Situation/Hospitalization: No - Comment as needed  Activities of Daily Living      Permission Sought/Granted Permission sought to share information with : Case Manager Permission granted to share information with : Yes, Verbal Permission Granted  Share Information with NAME: [name]  Permission granted to share info w AGENCY: SNF or [place] granted to share info w Relationship: Daughter     Emotional Assessment Appearance:: Appears stated age Attitude/Demeanor/Rapport: Unable to Assess Affect (typically observed): Unable to Assess Orientation: : Oriented to Self, Oriented to Place, Oriented to Situation Alcohol / Substance Use: Not Applicable Psych Involvement: No (comment)  Admission diagnosis:  Facial laceration, initial encounter [S01.81XA] Gastrointestinal hemorrhage, unspecified gastrointestinal hemorrhage type [K92.2] Anemia, unspecified type [D64.9] Patient Active Problem List   Diagnosis Date Noted  . Gastrointestinal hemorrhage   .  Anemia 08/21/2019  . Weakness 08/12/2019  . Constipation 01/29/2019  . Chronic breast pain 12/26/2018  . Pain in gums 12/26/2018  . History of deep vein thrombosis 10/17/2018  . Severe protein-calorie malnutrition [place] Oiler: less than 60% of standard weight) ([place])  10/17/2018  . Disorder associated with well-controlled type 2 diabetes mellitus ([place]) 10/17/2018  . History of pulmonary embolus (PE) 10/17/2018  . Hypomagnesemia 08/27/2018  . Left ventricular hypertrophy by electrocardiogram 08/27/2018  . Atypical chest pain 08/26/2018  . Advanced nonexudative age-related macular degeneration of both eyes without subfoveal involvement 07/30/2018  . Altered mental status 06/06/2018  . Microcytic anemia   . TIA (transient ischemic attack) 06/03/2018  . Mobility impaired 04/26/2018  . Protein-calorie malnutrition, severe 02/06/2017  . Urinary tract infection without hematuria 02/03/2017  . Hypokalemia 10/13/2016  . Pressure injury of heel, stage 2 ([place]) 08/31/2016  . Pulmonary embolus ([place]) 08/30/2016  . Dementia without behavioral disturbance ([place]) 06/01/2016  . Depression 01/20/2016  . Suicidal ideation 01/19/2016  . Prolapse of female pelvic organs 12/28/2015  . Leg weakness, bilateral 12/08/2015  . Weakness of right lower extremity 11/25/2015  . Dry eyes 10/28/2015  . (HFpEF) heart failure with preserved ejection fraction ([place])   . Gout 05/14/2015  . Delirium secondary to multiple medical problems 04/15/2015  . Primary hypertension   . Seasonal allergies 02/10/2015  . Stroke-like symptoms   . Stroke-like episode 01/25/2015  . Syncope and collapse 01/25/2015  . Dizziness 09/19/2014  . Anemia of chronic illness 08/19/2014  . Acute superficial venous thrombosis of right lower extremity 08/19/2014  . Hypercalcemia 06/09/2014  . Breast cancer, left breast ([place]) 06/09/2014  . Breast cancer, right breast ([place]) 06/09/2014  . Edema of left lower extremity 03/30/2014  . PAD (peripheral artery disease) ([place]) 03/30/2014  . History of colon cancer 12/11/2013  . History of fall 05/15/2013  . Stricture and stenosis of esophagus 10/27/2012  . Thoracic back pain 05/24/2012  . Productive cough 04/30/2012  . Tobacco use disorder 06/02/2011  .  Post-mastectomy pain 05/02/2011  . Hiatal hernia 03/22/2011  . Cough 01/31/2011  . 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 IDA (iron deficiency anemia) 04/29/2007  . Controlled type 2 diabetes mellitus with diabetic neuropathy, without long-term current use of insulin ([place]) 01/10/2007  . Coronary atherosclerosis 01/10/2007  . Asthma 01/10/2007  . Reflux esophagitis 01/10/2007  . Osteoarthritis, multiple sites 01/10/2007   PCP:  Danna Hefty, DO Pharmacy:   [id number] - [place], [place] [id number] [place] [id number] Phone: [phone] Fax: [phone]     Social Determinants of Health (SDOH) Interventions    Readmission Risk Interventions Readmission Risk Prevention Plan 08/12/2019 01/25/2019  Transportation Screening Complete -  PCP or Specialist Appt within 3-5 Days Not Complete Complete  Not Complete comments Will transition to a SNF -  [place] or [place] Complete Complete  Social Work Consult for [place] Planning/Counseling Complete -  Palliative Care Screening Not Applicable Not Applicable  Medication Review Press photographer) Complete Complete  Some recent data might be hidden

## 2019-08-23 NOTE — Discharge Summary (Addendum)
[facility] Discharge Summary  [name] Medical record number: [id number] Date of birth: 25-Feb-1924 Age: 85 y.o. Gender: female Date of Admission: 08/21/2019  Date of Discharge: 08/23/2019 Admitting Physician: Leeanne Rio, MD  Primary Care Provider: Danna Hefty, DO Consultants: Cardiology, curbside GI   Indication for Hospitalization: anemia  Discharge Diagnoses/Problem List:  Severe Anemia 2/2 GI Bleed, h/o of IDA  Elevated troponins Recent Fall AKI-resolved Dementia T2DM Asthma  Tobacco Use Disorder HTN HFpEF CAD Insomnia GERD Protein calorie malnutrition   Disposition: improving   Discharge Condition: stable   Discharge Exam:  General: Awake and alert, laying comfortably in bed, no apparent distress Cardiovascular: Regular rate and rhythm, no murmurs rubs or gallops Respiratory: Clear to auscultation bilaterally, no wheezing, rales, rhonchi Abdomen: Soft, nontender, nondistended, bowel sounds normal Extremities: No edema, nontender  Brief Hospital Course:  [name] is a 85 y.o. female presenting with anemia, with Hgb of 5.4. PMHx is significant for anemia, breast CA s/p bilat mastectomy, CAD, hx carotid artery aneurysm, CHF, colon CA, DM, GERD, HLD, HTN.   Anemia likely 2/2 GI bleed Patient initially present to [facility] ED after fall from nursing home. At time of presentation patient's Hgb was 5.4, repeat Hgb 5.8.  Patient also with positive FOBT.  Patient was slightly tachycardic and became hypotensive to 91/54.  The decision was made to transfer to [facility] emergency department for blood transfusion.  While in the emergency department at [facility] patient received 2 units packed red blood cells as well as started on Protonix drip.  It was discussed with both patient and her daughter, who is power of attorney, that she would not want aggressive measures.  Did agree to blood transfusions.  Patient  improved with 2 units to a hemoglobin of 10.4.  Continue to improve and hemoglobin on discharge was 9.6.  On date of discharge patient's outpatient GI doctor was consulted for a curbside consult just to call patient's daughter and explain further work-up.  Of note patient's aspirin and blood pressure medications were discontinued in the setting of acute GI bleed.  Will leave to PCP to restart as needed.  Per cardiology attending attestation, recommend holding aspirin until hemoglobin normalizes.  Elevated troponins Patient with elevated troponin on admission.  Patient was started at 1 and went up as high as 303.  Troponins were continue to trend and showed decline. Last three troponins [phone].  Cardiology was consulted during admission and thought that troponin leak was secondary to demand ischemia from anemia. Patient without chest pain and EKG without ST changes.   Recent falls Patient with multiple falls at her nursing facility.  Unclear etiology.  Patient does not remember that and daughter states she was not informed.  PT and OT were consulted.  PT recommended SNF however patient states she would like to be discharged home.  Patient's daughter also agreed to this and stated she has 24-hour supervision there.  Patient's daughter was informed prior to discharge.  Home health PT was ordered for patient.  Seizure-like activity On 10/10 patient had multiple episodes of bilateral upper extremity tremor and staring off into space.  She is evaluated by neurology started on Keppra.  She underwent MRI/MRI brain which showed advanced small vessel disease and age-appropriate atrophy.  She underwent bilateral carotid Dopplers, EEG which showed no evidence of seizure like activity.  However, neurology is recommending continuing Keppra 500 mg twice daily on discharge.  Issues for  Follow Up:  1. ASA held in the setting of GI bleed, consider restarting given h/o CAD 2. Monitor Hgb 3. BP meds held, consider  restarting 4. protonix changed to BID  Significant Procedures: None  Significant Labs and Imaging:  Recent Labs  Lab 08/23/19 0346 08/23/19 1233 08/24/19 0528  WBC 5.3 6.6 4.2  HGB 9.6* 11.7* 11.6*  HCT 28.1* 33.4* 34.0*  PLT 135* PLATELET CLUMPS NOTED ON SMEAR, UNABLE TO ESTIMATE 113*   Recent Labs  Lab 08/21/19 1553 08/22/19 0925 08/23/19 0135 08/23/19 0346 08/24/19 0528  NA 135 143 139 139 141  K 4.9 4.3 3.6 3.5 3.4*  CL 110 116* 116* 115* 113*  CO2 18* 18* 17* 16* 21*  GLUCOSE 182* 89 77 106* 84  BUN 78* 46* 26* 24* 10  CREATININE 1.53* 1.24* 1.05* 1.03* 0.90  CALCIUM 11.3* 10.6* 10.0 9.9 10.3  ALKPHOS 47  --   --   --   --   AST 15  --   --   --   --   ALT 6  --   --   --   --   ALBUMIN 3.0*  --   --   --   --     Ref. Range 08/21/2019 15:53 08/21/2019 18:30 08/22/2019 09:25 08/22/2019 13:10 08/22/2019 20:57 08/22/2019 23:15 08/23/2019 01:35 08/23/2019 03:46 08/23/2019 05:46  Troponin I (High Sensitivity) Latest Ref Range: <18 ng/L 35 (H) 38 (H) 189 (HH) 217 (HH) 303 (HH) 265 (HH) 288 (HH) 282 (HH) 244 (HH)   Dg Ribs Unilateral W/chest Left  Result Date: 08/21/2019 CLINICAL DATA:  Fall today from chair at nursing home landing on left side. EXAM: LEFT RIBS AND CHEST - 3+ VIEW COMPARISON:  Chest x-ray 05/02/2019 FINDINGS: Lungs are clear. No evidence of pneumothorax. Cardiomediastinal silhouette is within normal. There are degenerative changes of the spine. No evidence of acute fracture. IMPRESSION: No acute findings. Electronically Signed   By: Marin Olp M.D.   On: 08/21/2019 17:27   Ct Head Wo Contrast  Result Date: 08/23/2019 CLINICAL DATA:  Altered mental status. Possible seizure activity. EXAM: CT HEAD WITHOUT CONTRAST TECHNIQUE: Contiguous axial images were obtained from the base of the skull through the vertex without intravenous contrast. COMPARISON:  08/21/2019 FINDINGS: Brain: There is no evidence of acute infarct, intracranial hemorrhage, mass, midline shift,  or extra-axial fluid collection. Mild ventriculomegaly is unchanged and attributed to cerebral atrophy. Chronic lacunar infarcts are again seen in the deep gray nuclei. Bilateral cerebral white matter hypodensities are unchanged and nonspecific but compatible with chronic small vessel ischemic disease, mild for age. Vascular: Extensive calcified atherosclerosis at the skull base. No hyperdense vessel. Skull: No fracture or focal osseous lesion. Sinuses/Orbits: Visualized paranasal sinuses and mastoid air cells are clear. Bilateral cataract extraction is noted. Other: Persistent small left frontal scalp hematoma. IMPRESSION: 1. No evidence of acute intracranial abnormality. 2. Mild chronic small vessel ischemic disease. 3. Small left frontal scalp hematoma. Electronically Signed   By: Logan Bores M.D.   On: 08/23/2019 14:49   Ct Head Wo Contrast  Result Date: 08/21/2019 CLINICAL DATA:  Fall. EXAM: CT HEAD WITHOUT CONTRAST CT CERVICAL SPINE WITHOUT CONTRAST TECHNIQUE: Multidetector CT imaging of the head and cervical spine was performed following the standard protocol without intravenous contrast. Multiplanar CT image reconstructions of the cervical spine were also generated. COMPARISON:  CT head and cervical spine dated Mar 14, 2019. FINDINGS: CT HEAD FINDINGS Brain: No evidence of acute infarction, hemorrhage, hydrocephalus, extra-axial  collection or mass lesion/mass effect. Stable atrophy and chronic microvascular ischemic changes. Vascular: Calcified atherosclerosis at the skullbase. No hyperdense vessel. Skull: Negative for fracture or focal lesion. Sinuses/Orbits: No acute finding. Other: Small left forehead scalp hematoma and laceration. CT CERVICAL SPINE FINDINGS Alignment: No traumatic malalignment. Skull base and vertebrae: No acute fracture. No primary bone lesion or focal pathologic process. Soft tissues and spinal canal: No prevertebral fluid or swelling. No visible canal hematoma. Disc levels:  Overall moderate diffuse cervical spondylosis, similar to prior study. Upper chest: Minimal centrilobular emphysema. Other: None. IMPRESSION: 1. No acute intracranial abnormality. Small left forehead scalp hematoma and laceration. 2.  No acute cervical spine fracture. Electronically Signed   By: Titus Dubin M.D.   On: 08/21/2019 15:57   Ct Cervical Spine Wo Contrast  Result Date: 08/21/2019 CLINICAL DATA:  Fall. EXAM: CT HEAD WITHOUT CONTRAST CT CERVICAL SPINE WITHOUT CONTRAST TECHNIQUE: Multidetector CT imaging of the head and cervical spine was performed following the standard protocol without intravenous contrast. Multiplanar CT image reconstructions of the cervical spine were also generated. COMPARISON:  CT head and cervical spine dated Mar 14, 2019. FINDINGS: CT HEAD FINDINGS Brain: No evidence of acute infarction, hemorrhage, hydrocephalus, extra-axial collection or mass lesion/mass effect. Stable atrophy and chronic microvascular ischemic changes. Vascular: Calcified atherosclerosis at the skullbase. No hyperdense vessel. Skull: Negative for fracture or focal lesion. Sinuses/Orbits: No acute finding. Other: Small left forehead scalp hematoma and laceration. CT CERVICAL SPINE FINDINGS Alignment: No traumatic malalignment. Skull base and vertebrae: No acute fracture. No primary bone lesion or focal pathologic process. Soft tissues and spinal canal: No prevertebral fluid or swelling. No visible canal hematoma. Disc levels: Overall moderate diffuse cervical spondylosis, similar to prior study. Upper chest: Minimal centrilobular emphysema. Other: None. IMPRESSION: 1. No acute intracranial abnormality. Small left forehead scalp hematoma and laceration. 2.  No acute cervical spine fracture. Electronically Signed   By: Titus Dubin M.D.   On: 08/21/2019 15:57   Mr Angio Head Wo Contrast  Result Date: 08/24/2019 CLINICAL DATA:  Syncope and collapse EXAM: MRI HEAD WITHOUT CONTRAST MRA HEAD WITHOUT  CONTRAST TECHNIQUE: Multiplanar, multiecho pulse sequences of the brain and surrounding structures were obtained without intravenous contrast. Angiographic images of the head were obtained using MRA technique without contrast. COMPARISON:  Head CT 08/23/2019 FINDINGS: MRI HEAD FINDINGS BRAIN: There is no acute infarct, acute hemorrhage or extra-axial collection. Diffuse confluent hyperintense T2-weighted signal within the periventricular, deep and juxtacortical white matter, most commonly due to chronic ischemic microangiopathy. There is generalized atrophy without lobar predilection. There are multiple old small vessel infarcts of the deep gray nuclei. The midline structures are normal. VASCULAR: The major intracranial arterial and venous sinus flow voids are normal. Two foci of supratentorial chronic microhemorrhage. SKULL AND UPPER CERVICAL SPINE: Calvarial bone marrow signal is normal. There is no skull base mass. The visualized upper cervical spine and soft tissues are normal. SINUSES/ORBITS: There are no fluid levels or advanced mucosal thickening. The mastoid air cells and middle ear cavities are free of fluid. The orbits are normal. MRA HEAD FINDINGS The angiographic portion of the study is moderately motion degraded. POSTERIOR CIRCULATION: --Vertebral arteries: Patent V4 segments. --Posterior inferior cerebellar arteries (PICA): Poor visualization due to motion. --Anterior inferior cerebellar arteries (AICA): Poor visualization due to motion. --Basilar artery: Normal. --Superior cerebellar arteries: Normal. --Posterior cerebral arteries: Normal. Both originate from the basilar artery. Posterior communicating arteries (p-comm) are diminutive or absent. ANTERIOR CIRCULATION: --Intracranial internal carotid arteries: Multifocal  irregularity, likely due to atherosclerosis, though probably exaggerated by motion. --Anterior cerebral arteries (ACA): Patent --Middle cerebral arteries (MCA): patent IMPRESSION: 1. No  acute intracranial abnormality. 2. Atrophy, advanced chronic small vessel ischemic microangiopathy and multiple old deep gray nuclei infarcts. 3. Moderately motion degraded MRA. No emergent large vessel occlusion. Electronically Signed   By: Ulyses Jarred M.D.   On: 08/24/2019 05:13   Mr Brain Wo Contrast  Result Date: 08/24/2019 CLINICAL DATA:  Syncope and collapse EXAM: MRI HEAD WITHOUT CONTRAST MRA HEAD WITHOUT CONTRAST TECHNIQUE: Multiplanar, multiecho pulse sequences of the brain and surrounding structures were obtained without intravenous contrast. Angiographic images of the head were obtained using MRA technique without contrast. COMPARISON:  Head CT 08/23/2019 FINDINGS: MRI HEAD FINDINGS BRAIN: There is no acute infarct, acute hemorrhage or extra-axial collection. Diffuse confluent hyperintense T2-weighted signal within the periventricular, deep and juxtacortical white matter, most commonly due to chronic ischemic microangiopathy. There is generalized atrophy without lobar predilection. There are multiple old small vessel infarcts of the deep gray nuclei. The midline structures are normal. VASCULAR: The major intracranial arterial and venous sinus flow voids are normal. Two foci of supratentorial chronic microhemorrhage. SKULL AND UPPER CERVICAL SPINE: Calvarial bone marrow signal is normal. There is no skull base mass. The visualized upper cervical spine and soft tissues are normal. SINUSES/ORBITS: There are no fluid levels or advanced mucosal thickening. The mastoid air cells and middle ear cavities are free of fluid. The orbits are normal. MRA HEAD FINDINGS The angiographic portion of the study is moderately motion degraded. POSTERIOR CIRCULATION: --Vertebral arteries: Patent V4 segments. --Posterior inferior cerebellar arteries (PICA): Poor visualization due to motion. --Anterior inferior cerebellar arteries (AICA): Poor visualization due to motion. --Basilar artery: Normal. --Superior cerebellar  arteries: Normal. --Posterior cerebral arteries: Normal. Both originate from the basilar artery. Posterior communicating arteries (p-comm) are diminutive or absent. ANTERIOR CIRCULATION: --Intracranial internal carotid arteries: Multifocal irregularity, likely due to atherosclerosis, though probably exaggerated by motion. --Anterior cerebral arteries (ACA): Patent --Middle cerebral arteries (MCA): patent IMPRESSION: 1. No acute intracranial abnormality. 2. Atrophy, advanced chronic small vessel ischemic microangiopathy and multiple old deep gray nuclei infarcts. 3. Moderately motion degraded MRA. No emergent large vessel occlusion. Electronically Signed   By: Ulyses Jarred M.D.   On: 08/24/2019 05:13   Dg Abd Acute W/chest  Result Date: 08/10/2019 CLINICAL DATA:  Nausea and vomiting EXAM: DG ABDOMEN ACUTE W/ 1V CHEST COMPARISON:  None. FINDINGS: Normal mediastinum and cardiac silhouette. Normal pulmonary vasculature. No evidence of effusion, infiltrate, or pneumothorax. No acute bony abnormality. No dilated large or small bowel. No intraperitoneal free air. Gas in the rectum. Surgical clips in the lower pelvis. Degenerate changes of the spine.  Osteoarthritis of the hips. IMPRESSION: 1.  No acute cardiopulmonary process. 2. No bowel obstruction. Electronically Signed   By: Suzy Bouchard M.D.   On: 08/10/2019 14:54   Vas US Carotid  Result Date: 08/24/2019 Carotid Arterial Duplex Study Indications:       Syncope. Risk Factors:      Hypertension, hyperlipidemia, Diabetes. Comparison Study:  Prior study from 01/28/15 is available for comparison Performing Technologist: Sharion Dove RVS  Examination Guidelines: A complete evaluation includes B-mode imaging, spectral Doppler, color Doppler, and power Doppler as needed of all accessible portions of each vessel. Bilateral testing is considered an integral part of a complete examination. Limited examinations for reoccurring indications may be performed as  noted.  Right Carotid Findings: +----------+--------+--------+--------+------------------+------------------+  PSV cm/sEDV cm/sStenosisPlaque DescriptionComments           +----------+--------+--------+--------+------------------+------------------+ CCA Prox  51      12                                intimal thickening +----------+--------+--------+--------+------------------+------------------+ CCA Distal51      3                                 intimal thickening +----------+--------+--------+--------+------------------+------------------+ ICA Prox  58      17              heterogenous                         +----------+--------+--------+--------+------------------+------------------+ ICA Distal76      21                                                   +----------+--------+--------+--------+------------------+------------------+ ECA       81      6                                                    +----------+--------+--------+--------+------------------+------------------+ +----------+--------+-------+--------+-------------------+           PSV cm/sEDV cmsDescribeArm Pressure (mmHG) +----------+--------+-------+--------+-------------------+ [id number]                                         +----------+--------+-------+--------+-------------------+ +---------+--------+--+--------+--+ VertebralPSV cm/s71EDV cm/s16 +---------+--------+--+--------+--+  Left Carotid Findings: +----------+--------+--------+--------+------------------+------------------+           PSV cm/sEDV cm/sStenosisPlaque DescriptionComments           +----------+--------+--------+--------+------------------+------------------+ CCA Prox  86      13                                intimal thickening +----------+--------+--------+--------+------------------+------------------+ CCA Distal63      11                                intimal thickening  +----------+--------+--------+--------+------------------+------------------+ ICA Prox  42      13              heterogenous                         +----------+--------+--------+--------+------------------+------------------+ ICA Distal57      14                                                   +----------+--------+--------+--------+------------------+------------------+ ECA       40      4                                                    +----------+--------+--------+--------+------------------+------------------+ +----------+--------+--------+--------+-------------------+  PSV cm/sEDV cm/sDescribeArm Pressure (mmHG) +----------+--------+--------+--------+-------------------+ Subclavian114                                         +----------+--------+--------+--------+-------------------+ +---------+--------+--+--------+--+ VertebralPSV cm/s37EDV cm/s11 +---------+--------+--+--------+--+  Summary: Right Carotid: The extracranial vessels were near-normal with only minimal wall                thickening or plaque. No significant change since study done                01/28/15. Left Carotid: The extracranial vessels were near-normal with only minimal wall               thickening or plaque. No significant change since study done               01/28/15. Vertebrals:  Bilateral vertebral arteries demonstrate antegrade flow. Subclavians: Normal flow hemodynamics were seen in bilateral subclavian              arteries. *See table(s) above for measurements and observations.  Electronically signed by Antony Contras MD on 08/24/2019 at 11:29:51 AM.    Final      Results/Tests Pending at Time of Discharge:  Unresulted Labs (From admission, onward)    Start     Ordered   08/25/19 [id number]  Basic metabolic panel  Tomorrow morning,   R    Question:  Specimen collection method  Answer:  Lab=Lab collect   08/24/19 0924   08/24/19 1355  SARS Coronavirus 2 by RT PCR (hospital  order, performed in [facility] hospital lab) Nasopharyngeal Nasopharyngeal Swab  (Symptomatic/High Risk of Exposure/Tier 1 Patients Labs with Precautions)  Once,   R    Question Answer Comment  Is this test for diagnosis or screening Screening   Symptomatic for COVID-19 as defined by CDC No   Hospitalized for COVID-19 No   Admitted to ICU for COVID-19 No   Previously tested for COVID-19 No   Resident in a congregate (group) care setting No   Employed in healthcare setting No   Pregnant No      08/24/19 1354   08/23/19 1342  Urinalysis, Routine w reflex microscopic  Once,   R     08/23/19 1342           Discharge Medications:  Allergies as of 08/24/2019      Reactions   Tape Other (See Comments)   Skin is very thin and will tear and bruise easily!!   Lisinopril Cough      Medication List    STOP taking these medications   amLODipine 5 MG tablet Commonly known as: NORVASC   aspirin EC 325 MG tablet   carvedilol 12.5 MG tablet Commonly known as: COREG   cephALEXin 500 MG capsule Commonly known as: KEFLEX   losartan 50 MG tablet Commonly known as: COZAAR   potassium chloride SA 20 MEQ tablet Commonly known as: KLOR-CON   ranitidine 150 MG capsule Commonly known as: ZANTAC     TAKE these medications   acetaminophen 500 MG tablet Commonly known as: TYLENOL Take 500 mg by mouth every 6 (six) hours as needed for moderate pain.   albuterol 108 (90 Base) MCG/ACT inhaler Commonly known as: VENTOLIN HFA Inhale 2 puffs into the lungs every 6 (six) hours as needed. For shortness of breath   diclofenac sodium 1 % Gel Commonly known as: VOLTAREN Apply 2  g topically 4 (four) times daily.   donepezil 10 MG tablet Commonly known as: Aricept Take 1 tablet (10 mg total) by mouth at bedtime.   feeding supplement (PRO-STAT SUGAR FREE 64) Liqd Take 30 mLs by mouth 2 (two) times daily with a meal. ensure   ferrous sulfate 325 (65 FE) MG tablet Take 1 tablet (325 mg  total) by mouth daily with breakfast.   fluticasone 50 MCG/ACT nasal spray Commonly known as: FLONASE PLACE 1 SPRAY INTO BOTH NOSTRILS DAILY.   levETIRAcetam 500 MG 24 hr tablet Commonly known as: KEPPRA XR Take 1 tablet (500 mg total) by mouth 2 (two) times daily.   pantoprazole 40 MG tablet Commonly known as: PROTONIX Take 1 tablet (40 mg total) by mouth 2 (two) times daily. What changed: when to take this   pantoprazole 40 MG tablet Commonly known as: PROTONIX Take 1 tablet (40 mg total) by mouth 2 (two) times daily. What changed: You were already taking a medication with the same name, and this prescription was added. Make sure you understand how and when to take each.   polyethylene glycol 17 g packet Commonly known as: MIRALAX / GLYCOLAX Take 17 g by mouth daily.   rosuvastatin 20 MG tablet Commonly known as: CRESTOR TAKE 1 TABLET BY MOUTH EVERYDAY AT BEDTIME What changed: See the new instructions.   senna-docusate 8.6-50 MG tablet Commonly known as: Senokot-S Take 2 tablets by mouth at bedtime. What changed:   how much to take  when to take this  reasons to take this   traZODone 100 MG tablet Commonly known as: DESYREL TAKE 1 TABLET (100 MG TOTAL) BY MOUTH AT BEDTIME AS NEEDED FOR SLEEP. What changed: when to take this       Discharge Instructions: Please refer to Patient Instructions section of EMR for full details.  Patient was counseled important signs and symptoms that should prompt return to medical care, changes in medications, dietary instructions, activity restrictions, and follow up appointments.   Follow-Up Appointments: Follow-up Information    Guadalupe Dawn, MD. Go on 08/27/2019.   Specialty: Family Medicine Why: @1 :50PM (Please arrive 15-20 min prior to appointment time). If you have a cough, fever, or shortness of breath please call before arriving Contact information: [address] [place] [address] [phone]            [name], MD 08/24/2019, 2:23 PM PGY-2, [place]

## 2019-08-23 NOTE — Consult Note (Signed)
Neurology Consultation  Reason for Consult: concern for seizure Referring Physician: Dr. Gwendlyn Deutscher  CC: seizure-like activity  History is obtained from: patient and RN  HPI: [name] is a 85 y.o. female with PMH of HTN, 3 vessel CAD, CHF, R ICA aneurysm, hx of Colon CA, DM, hx of MI, hx of GI bleed, vertebrobasilar artery syndrome, breast CA s/p mastectomy,  And dementia without behavioral disturbance.  Patient initially admitted for fall at SNF, found to have symptomatic anemia of 5.4, mild troponin leak/T wave inversions of lateral leads likely secondary to demand ischemia, s/p 2u transfusion. She was also guaiac + on eval, started on IV protonix and admitted for management.   On 10/10, bedside RN noted patient to have 3-4 episodes of upward gaze and tremors of BUE, unresponsive to voice/tactile stimulation including sternal rub, episodes lasted approx 20 seconds, no post ictal period, no incontinence/no tongue biting. Patient unable to recall event. She does report that she has had prior seizure like episodes but doesn't remember when/what happened.  Chart review: Per records, pt has been admitted previously for work up of L facial droop and AMS with no underlying pathology identified.  Work up that has been done:  - CTH: small left frontal scalp hematoma, no intracranial abnormality, mild chronic small vessel disease. - CT C spine- no abnormality - routine labs (see below)  ROS: A 14 point ROS was performed and is negative except as noted in the HPI.  Past Medical History:  Diagnosis Date  . Allergy   . Anemia    Iron def anemia  . Arthritis   . Asthma   . Breast cancer ([place]) 10/2010   Invasive Ductal Carcinoma, s/p bilateral mastectomy, now on Tamoxifen. Followed by Dr Jamse Arn.   . Breast cancer ([place]) 2011   s/p Bilateral masectomy  . CAD (coronary artery disease)   . Carotid artery aneurysm ([place]) 08/2010   right ICA, 5 x 49mm  . CHF (congestive heart failure) (HCC)    Systolic   . Colon cancer ([place])   . Colon cancer ([place]) 12/11/2013  . DDD (degenerative disc disease), cervical   . Diabetes mellitus   . Diverticulosis   . GERD (gastroesophageal reflux disease)   . H/O: GI bleed   . Hiatal hernia   . History of colon cancer   . Hypercalcemia 06/09/2014  . Hyperlipidemia   . Hypertension   . Myocardial infarction ([place])   . Neuropathy   . Pneumonia 03/2012  . Pressure injury of heel, stage 2 ([place]) 08/31/2016  . Shortness of breath    Family History  Problem Relation Age of Onset  . Sickle cell anemia Other   . Heart disease Mother   . Heart disease Father   . Cancer Daughter 89       breast ca  . Hypertension Daughter   . Cancer Daughter 34       breast ca  . Hypertension Daughter   . Heart disease Son        Poor circulation-Left Leg  . Diabetes Daughter   . Hypertension Daughter   . Hyperlipidemia Daughter        Poor circulation- Toe amputation   Social History:   reports that she has never smoked. Her smokeless tobacco use includes snuff. She reports that she does not drink alcohol or use drugs.  Medications  Current Facility-Administered Medications:  .  0.9 %  sodium chloride infusion, , Intravenous, Continuous, Abraham, Sherin, DO, Last Rate: 75  mL/hr at 08/23/19 0426 .  albuterol (PROVENTIL) (2.5 MG/3ML) 0.083% nebulizer solution 2.5 mg, 2.5 mg, Inhalation, Q6H PRN, Leeanne Rio, MD .  calcium carbonate (TUMS - dosed in mg elemental calcium) chewable tablet 400 mg of elemental calcium, 2 tablet, Oral, TID PRN, Patrina Levering, Anitha, PA-C .  donepezil (ARICEPT) tablet 10 mg, 10 mg, Oral, QHS, Abraham, Sherin, DO, 10 mg at 08/22/19 2129 .  famotidine (PEPCID) tablet 10 mg, 10 mg, Oral, Daily, Bhuvaneswaran, Anitha, PA-C .  rosuvastatin (CRESTOR) tablet 10 mg, 10 mg, Oral, QHS, Abraham, Sherin, DO, 10 mg at 08/22/19 2129  Exam: Current vital signs: BP (!) 175/78   Pulse 90   Temp 98.8 F (37.1 C) (Oral)   Resp 19   Wt  49 kg   SpO2 100%   BMI 17.97 kg/m  Vital signs in last 24 hours: Temp:  [98.2 F (36.8 C)-98.8 F (37.1 C)] 98.8 F (37.1 C) (10/10 0732) Pulse Rate:  [83-90] 90 (10/10 1124) Resp:  [17-19] 19 (10/09 2348) BP: (136-175)/(50-78) 175/78 (10/10 1124) SpO2:  [97 %-100 %] 100 % (10/10 1124)  Physical Exam  Constitutional: Appears thin and cachectic, no acute distress Psych: Affect appropriate to situation Eyes: No scleral injection HENT: No OP obstrucion Head: Normocephalic.  Cardiovascular: Normal rate and regular rhythm.  Respiratory: Effort normal, non-labored breathing GI: Soft.  No distension. There is no tenderness.  Skin: WDI  Neuro: Mental Status: Patient is awake, alert, oriented to person, place, month, year, and situation. Patient is able to provide history. No signs of aphasia or neglect. Cranial Nerves: II: Visual Fields are full.  III,IV, VI: EOMI without ptosis or diploplia. Pupils equal, round and reactive to light V: Facial sensation is symmetric to temperature VII: Facial movement is symmetric.  VIII: hearing is intact to voice X: Palat elevates symmetrically XI: Shoulder shrug is symmetric. XII: tongue is midline without atrophy or fasciculations.  Motor: Tone is normal. Grossly equal strength was present in all four extremities. Sensory: Sensation is symmetric to light touch and temperature in the arms and legs.  Labs I have reviewed labs in epic and the results pertinent to this consultation are:  CBC    Component Value Date/Time   WBC 5.3 08/23/2019 0346   RBC 3.56 (L) 08/23/2019 0346   HGB 9.6 (L) 08/23/2019 0346   HGB 10.4 (L) 04/09/2019 1024   HGB 9.1 (L) 12/10/2014 1129   HCT 28.1 (L) 08/23/2019 0346   HCT 34.3 04/09/2019 1024   HCT 28.9 (L) 12/10/2014 1129   PLT 135 (L) 08/23/2019 0346   PLT 256 04/09/2019 1024   MCV 78.9 (L) 08/23/2019 0346   MCV 76 (L) 04/09/2019 1024   MCV 70.3 (L) 12/10/2014 1129   MCH 27.0 08/23/2019 0346    MCHC 34.2 08/23/2019 0346   RDW 17.9 (H) 08/23/2019 0346   RDW 16.8 (H) 04/09/2019 1024   RDW 17.0 (H) 12/10/2014 1129   LYMPHSABS 1.1 08/21/2019 1553   LYMPHSABS 1.7 10/15/2018 1622   LYMPHSABS 1.3 12/10/2014 1129   MONOABS 0.6 08/21/2019 1553   MONOABS 0.3 12/10/2014 1129   EOSABS 0.0 08/21/2019 1553   EOSABS 0.1 10/15/2018 1622   BASOSABS 0.0 08/21/2019 1553   BASOSABS 0.0 10/15/2018 1622   BASOSABS 0.0 12/10/2014 1129    CMP     Component Value Date/Time   NA 139 08/23/2019 0346   NA 141 04/09/2019 1024   NA 143 12/10/2014 1130   K 3.5 08/23/2019 0346   K  3.7 12/10/2014 1130   CL 115 (H) 08/23/2019 0346   CL 105 12/11/2012 1349   CO2 16 (L) 08/23/2019 0346   CO2 24 12/10/2014 1130   GLUCOSE 106 (H) 08/23/2019 0346   GLUCOSE 141 (H) 12/10/2014 1130   GLUCOSE 218 (H) 12/11/2012 1349   BUN 24 (H) 08/23/2019 0346   BUN 10 04/09/2019 1024   BUN 18.3 12/10/2014 1130   CREATININE 1.03 (H) 08/23/2019 0346   CREATININE 1.03 (H) 10/13/2016 1227   CREATININE 1.2 (H) 12/10/2014 1130   CALCIUM 9.9 08/23/2019 0346   CALCIUM 10.1 12/10/2014 1130   PROT 6.5 08/21/2019 1553   PROT 7.0 04/09/2019 1024   PROT 7.1 12/10/2014 1130   ALBUMIN 3.0 (L) 08/21/2019 1553   ALBUMIN 3.7 04/09/2019 1024   ALBUMIN 3.5 12/10/2014 1130   AST 15 08/21/2019 1553   AST 10 12/10/2014 1130   ALT 6 08/21/2019 1553   ALT <6 12/10/2014 1130   ALKPHOS 47 08/21/2019 1553   ALKPHOS 38 (L) 12/10/2014 1130   BILITOT 0.3 08/21/2019 1553   BILITOT 0.3 04/09/2019 1024   BILITOT 0.28 12/10/2014 1130   GFRNONAA 47 (L) 08/23/2019 0346   GFRNONAA 48 (L) 10/13/2016 1227   GFRAA 54 (L) 08/23/2019 0346   GFRAA 55 (L) 10/13/2016 1227    Lipid Panel     Component Value Date/Time   CHOL 78 06/04/2018 0418   TRIG 91 06/04/2018 0418   HDL 28 (L) 06/04/2018 0418   CHOLHDL 2.8 06/04/2018 0418   VLDL 18 06/04/2018 0418   LDLCALC 32 06/04/2018 0418   LDLDIRECT 99 10/21/2009 2033   Urinalysis    Component  Value Date/Time   COLORURINE YELLOW 08/21/2019 1714   APPEARANCEUR CLEAR 08/21/2019 1714   LABSPEC 1.020 08/21/2019 1714   PHURINE 5.5 08/21/2019 1714   GLUCOSEU NEGATIVE 08/21/2019 1714   HGBUR NEGATIVE 08/21/2019 1714   HGBUR negative 10/21/2009 1424   BILIRUBINUR NEGATIVE 08/21/2019 1714   BILIRUBINUR negative 04/09/2019 0940   BILIRUBINUR NEG 08/03/2015 1240   KETONESUR NEGATIVE 08/21/2019 1714   PROTEINUR NEGATIVE 08/21/2019 1714   UROBILINOGEN 0.2 04/09/2019 0940   UROBILINOGEN 0.2 09/16/2015 1613   NITRITE NEGATIVE 08/21/2019 1714   LEUKOCYTESUR MODERATE (A) 08/21/2019 1714   Troponin 217 >> 303 >> 265 >> 288  Imaging I have reviewed the images obtained:  CT-scan of the brain 10/10 1. No evidence of acute intracranial abnormality. 2. Mild chronic small vessel ischemic disease. 3. Small left frontal scalp hematoma.  CT C spine 10/8 1. No acute intracranial abnormality. Small left forehead scalp hematoma and laceration. 2.  No acute cervical spine fracture.  DG Ribs 10/8  No acute findings.  MRI examination of the brain pending EEG pending  Posey Pronto PA-C [facility] [phone]  Assessment: 85 year old woman past history of hypertension, CAD, CHF, right IC aneurysm, colon cancer, diabetes, MI, GI bleed, vertebrobasilar insufficiency, breast cancer status post mastectomy and dementia without behavioral disturbance admitted for management of severe anemia had a seizure-like episode this morning.  Chart review reveals she had a similar left facial weakness episode concerning for TIA in 2018.  Due to stereotypical nature, seizures are high on the differential. Other considerations are behavioral disturbance associated with dementia, and less likely stroke.  Due to vertebrobasilar insufficiency, this could be syncopal/LOC episode associated with a brainstem stroke as well.  Impression: Evaluate for seizure Evaluate for stroke/posterior  circulation insufficiency Evaluate for dementia with behavioral disturbance  Recommendations: - CT neg for hemorrhage  or other abnormalities - EEG -MRI brain w/o cont -MRA head without contrast -Carotid dopplers - possible infectious etiology, UA showed leukocytes, no nitrites, will repeat - Load Keppra 750mg  IV, continue 500 mg BID po thereafter -stereotypic spells-last spell also was left facial weakness, hence concern for seizures.  History of dementia but make her susceptible for seizures as well. -Maintain seizure precautions -Management of medical conditions per primary team as you are.  Attending Neurohospitalist Addendum Patient seen and examined with APP/Resident. Agree with the history and physical as documented above. Agree with the plan as documented, which I helped formulate. I have independently reviewed the chart, obtained history, review of systems and examined the patient.I have personally reviewed pertinent head/neck/spine imaging (CT/MRI). Please feel free to call with any questions. --- Amie [place], MD [facility] Pager: [phone]  If 7pm to 7am, please call on call as listed on AMION.

## 2019-08-23 NOTE — NC FL2 (Signed)
[place] LEVEL OF CARE SCREENING TOOL     IDENTIFICATION  [name] Birthdate: 12-24-1923 Sex: female Admission Date (Current Location): 08/21/2019  [place] and [place] Number:  Kathleen Argue [id number] [place] and Address:  [facility] [place] [address] [place], [place] [address]      Provider Number: [id number]  Attending Physician Name and Address:  Kinnie Feil, MD  Relative Name and Phone Number:  [name], Daughter, [phone]    Current Level of Care: Hospital Recommended Level of Care: [place] Prior Approval Number:    Date Approved/Denied:   PASRR Number: [id number]  Discharge Plan: SNF    Current Diagnoses: Patient Active Problem List   Diagnosis Date Noted  . Anemia 08/21/2019  . Weakness 08/12/2019  . Constipation 01/29/2019  . Chronic breast pain 12/26/2018  . Pain in gums 12/26/2018  . History of deep vein thrombosis 10/17/2018  . Severe protein-calorie malnutrition Altamease Oiler: less than 60% of standard weight) ([place]) 10/17/2018  . Disorder associated with well-controlled type 2 diabetes mellitus ([place]) 10/17/2018  . History of pulmonary embolus (PE) 10/17/2018  . Hypomagnesemia 08/27/2018  . Left ventricular hypertrophy by electrocardiogram 08/27/2018  . Atypical chest pain 08/26/2018  . Advanced nonexudative age-related macular degeneration of both eyes without subfoveal involvement 07/30/2018  . Altered mental status 06/06/2018  . Microcytic anemia   . TIA (transient ischemic attack) 06/03/2018  . Mobility impaired 04/26/2018  . Protein-calorie malnutrition, severe 02/06/2017  . Urinary tract infection without hematuria 02/03/2017  . Hypokalemia 10/13/2016  . Pressure injury of heel, stage 2 ([place]) 08/31/2016  . Pulmonary embolus ([place]) 08/30/2016  . Dementia without behavioral disturbance ([place]) 06/01/2016  . Depression 01/20/2016  . Suicidal ideation 01/19/2016  .  Prolapse of female pelvic organs 12/28/2015  . Leg weakness, bilateral 12/08/2015  . Weakness of right lower extremity 11/25/2015  . Dry eyes 10/28/2015  . (HFpEF) heart failure with preserved ejection fraction ([place])   . Gout 05/14/2015  . Delirium secondary to multiple medical problems 04/15/2015  . Primary hypertension   . Seasonal allergies 02/10/2015  . Stroke-like symptoms   . Stroke-like episode 01/25/2015  . Syncope and collapse 01/25/2015  . Dizziness 09/19/2014  . Anemia of chronic illness 08/19/2014  . Acute superficial venous thrombosis of right lower extremity 08/19/2014  . Hypercalcemia 06/09/2014  . Breast cancer, left breast ([place]) 06/09/2014  . Breast cancer, right breast ([place]) 06/09/2014  . Edema of left lower extremity 03/30/2014  . PAD (peripheral artery disease) ([place]) 03/30/2014  . History of colon cancer 12/11/2013  . History of fall 05/15/2013  . Stricture and stenosis of esophagus 10/27/2012  . Thoracic back pain 05/24/2012  . Productive cough 04/30/2012  . Tobacco use disorder 06/02/2011  . Post-mastectomy pain 05/02/2011  . Hiatal hernia 03/22/2011  . Cough 01/31/2011  . 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 IDA (iron deficiency anemia) 04/29/2007  . Controlled type 2 diabetes mellitus with diabetic neuropathy, without long-term current use of insulin ([place]) 01/10/2007  . Coronary atherosclerosis 01/10/2007  . Asthma 01/10/2007  . Reflux esophagitis 01/10/2007  . Osteoarthritis, multiple sites 01/10/2007    Orientation RESPIRATION BLADDER Height & Weight     Self, Place, Situation  Normal Incontinent, External catheter Weight: 108 lb (49 kg) Height:     BEHAVIORAL SYMPTOMS/MOOD NEUROLOGICAL BOWEL NUTRITION STATUS      Continent Diet(Regular diet, thin liquids)  AMBULATORY STATUS COMMUNICATION OF NEEDS Skin   Limited Assist Verbally  Normal(Dry Skin)                       Personal Care Assistance Level of Assistance  Bathing,  Feeding, Dressing, Total care Bathing Assistance: Limited assistance Feeding assistance: Independent(Needs assistance setting up) Dressing Assistance: Limited assistance Total Care Assistance: Limited assistance   Functional Limitations Info  Sight, Hearing, Speech Sight Info: Impaired Hearing Info: Adequate Speech Info: Adequate    SPECIAL CARE FACTORS FREQUENCY  PT (By licensed PT)     PT Frequency: 5x/wk              Contractures Contractures Info: Not present    Additional Factors Info  Code Status, Allergies Code Status Info: DNR Allergies Info: Tape, Lisinopril           Current Medications (08/23/2019):  This is the current hospital active medication list Current Facility-Administered Medications  Medication Dose Route Frequency Provider Last Rate Last Dose  . 0.9 %  sodium chloride infusion   Intravenous Continuous [name], DO 75 mL/hr at 08/23/19 0426    . albuterol (PROVENTIL) (2.5 MG/3ML) 0.083% nebulizer solution 2.5 mg  2.5 mg Inhalation Q6H PRN Leeanne Rio, MD      . donepezil (ARICEPT) tablet 10 mg  10 mg Oral QHS Tammi Klippel, Sherin, DO   10 mg at 08/22/19 2129  . pantoprazole (PROTONIX) 80 mg in sodium chloride 0.9 % 250 mL (0.32 mg/mL) infusion  8 mg/hr Intravenous Continuous Isla Pence, MD 25 mL/hr at 08/23/19 0259 8 mg/hr at 08/23/19 0259  . [START ON 08/24/2019] pantoprazole (PROTONIX) injection 40 mg  40 mg Intravenous Q12H Abraham, Sherin, DO      . rosuvastatin (CRESTOR) tablet 10 mg  10 mg Oral QHS [name], DO   10 mg at 08/22/19 2129     Discharge Medications: Please see discharge summary for a list of discharge medications.  Relevant Imaging Results:  Relevant Lab Results:   Additional Information SSN: [id number]  Philippa Chester [name], LCSWA

## 2019-08-23 NOTE — Progress Notes (Signed)
I was called by the hospitalistist to see if I would discuss this patient with the daughter over the phone.  Daughter is apparently familiar with Dr. Collene Mares and wanted her opinion on the situation.  Patient is 85 years old and was admitted with anemia, hemoglobin of 5.4 g.  Was transfused.  Was heme positive, but no overt bleeding.  Family was hesitant to put her through any intervention/invasive procedures, but wanted to discuss with GI before being discharged from the hospital today.  I discussed with the patient's daughter over the phone on a three-way call with Dr. Gwendlyn Deutscher as well.  Told him that certainly we would not know what the source of the bleeding is without putting her through procedures.  Could be something like an AVM that could be cauterized, but certainly with her history of colon cancer could be recurrent malignancy, etc.  Since she has not having any overt bleeding it could certainly be a reasonable option as well due to her advanced age to just monitor hemoglobin closely as an outpatient and defer procedures for now.  After our conversation the patient's daughter understood the options and made the decision to observe for now with close follow-up of her hemoglobin by her PCP.  I told her daughter that I would send a message to Dr. Collene Mares and certainly if she felt differently she could reach out to them.  Dr. Collene Mares, I am sending this to you to make you aware.  Dr. Lyndel Safe did not talk with the patient or daughter at all so I figured that I would not get him involved.

## 2019-08-23 NOTE — Progress Notes (Signed)
   08/23/19 1604  Vitals  Temp 98.4 F (36.9 C)  Temp Source Oral  BP (!) 187/93  MAP (mmHg) 116  Pulse Rate (!) 118  Resp 20  Oxygen Therapy  SpO2 99 %  O2 Device Room Air  MEWS Score  MEWS RR 0  MEWS Pulse 2  MEWS Systolic 0  MEWS LOC 0  MEWS Temp 0  MEWS Score 2  MEWS Score Color Yellow  MEWS Assessment  Is this an acute change? Yes  MEWS guidelines implemented *See [place]  Provider Notification  Provider Name/Title Wilburn MD  Date Provider Notified 08/23/19  Time Provider Notified 1615  Notification Type Page (charge nurse Joellen Jersey spoke to him on callback)  Notification Reason Change in status    Yellow MEWS initiated.

## 2019-08-24 ENCOUNTER — Inpatient Hospital Stay (HOSPITAL_COMMUNITY): Payer: Medicare Other

## 2019-08-24 ENCOUNTER — Other Ambulatory Visit: Payer: Self-pay | Admitting: Family Medicine

## 2019-08-24 DIAGNOSIS — R55 Syncope and collapse: Secondary | ICD-10-CM | POA: Diagnosis not present

## 2019-08-24 DIAGNOSIS — Z23 Encounter for immunization: Secondary | ICD-10-CM | POA: Diagnosis not present

## 2019-08-24 DIAGNOSIS — E118 Type 2 diabetes mellitus with unspecified complications: Secondary | ICD-10-CM | POA: Diagnosis not present

## 2019-08-24 DIAGNOSIS — M6281 Muscle weakness (generalized): Secondary | ICD-10-CM | POA: Diagnosis not present

## 2019-08-24 DIAGNOSIS — Z681 Body mass index (BMI) 19 or less, adult: Secondary | ICD-10-CM | POA: Diagnosis not present

## 2019-08-24 DIAGNOSIS — D649 Anemia, unspecified: Secondary | ICD-10-CM | POA: Diagnosis not present

## 2019-08-24 DIAGNOSIS — R41841 Cognitive communication deficit: Secondary | ICD-10-CM | POA: Diagnosis not present

## 2019-08-24 DIAGNOSIS — R2681 Unsteadiness on feet: Secondary | ICD-10-CM | POA: Diagnosis not present

## 2019-08-24 DIAGNOSIS — R1311 Dysphagia, oral phase: Secondary | ICD-10-CM | POA: Diagnosis not present

## 2019-08-24 DIAGNOSIS — I5042 Chronic combined systolic (congestive) and diastolic (congestive) heart failure: Secondary | ICD-10-CM | POA: Diagnosis not present

## 2019-08-24 DIAGNOSIS — R569 Unspecified convulsions: Secondary | ICD-10-CM

## 2019-08-24 DIAGNOSIS — I10 Essential (primary) hypertension: Secondary | ICD-10-CM | POA: Diagnosis not present

## 2019-08-24 DIAGNOSIS — D62 Acute posthemorrhagic anemia: Secondary | ICD-10-CM | POA: Diagnosis not present

## 2019-08-24 DIAGNOSIS — R451 Restlessness and agitation: Secondary | ICD-10-CM | POA: Diagnosis not present

## 2019-08-24 DIAGNOSIS — R2689 Other abnormalities of gait and mobility: Secondary | ICD-10-CM | POA: Diagnosis not present

## 2019-08-24 DIAGNOSIS — R296 Repeated falls: Secondary | ICD-10-CM | POA: Diagnosis not present

## 2019-08-24 DIAGNOSIS — D500 Iron deficiency anemia secondary to blood loss (chronic): Secondary | ICD-10-CM | POA: Diagnosis not present

## 2019-08-24 DIAGNOSIS — I509 Heart failure, unspecified: Secondary | ICD-10-CM | POA: Diagnosis not present

## 2019-08-24 DIAGNOSIS — S0181XA Laceration without foreign body of other part of head, initial encounter: Secondary | ICD-10-CM | POA: Diagnosis not present

## 2019-08-24 DIAGNOSIS — Z20828 Contact with and (suspected) exposure to other viral communicable diseases: Secondary | ICD-10-CM | POA: Diagnosis not present

## 2019-08-24 DIAGNOSIS — I2510 Atherosclerotic heart disease of native coronary artery without angina pectoris: Secondary | ICD-10-CM | POA: Diagnosis not present

## 2019-08-24 DIAGNOSIS — M2550 Pain in unspecified joint: Secondary | ICD-10-CM | POA: Diagnosis not present

## 2019-08-24 DIAGNOSIS — K922 Gastrointestinal hemorrhage, unspecified: Secondary | ICD-10-CM | POA: Diagnosis not present

## 2019-08-24 DIAGNOSIS — Z66 Do not resuscitate: Secondary | ICD-10-CM | POA: Diagnosis not present

## 2019-08-24 DIAGNOSIS — F0390 Unspecified dementia without behavioral disturbance: Secondary | ICD-10-CM | POA: Diagnosis not present

## 2019-08-24 DIAGNOSIS — Z7401 Bed confinement status: Secondary | ICD-10-CM | POA: Diagnosis not present

## 2019-08-24 DIAGNOSIS — F0391 Unspecified dementia with behavioral disturbance: Secondary | ICD-10-CM | POA: Diagnosis not present

## 2019-08-24 DIAGNOSIS — W07XXXA Fall from chair, initial encounter: Secondary | ICD-10-CM | POA: Diagnosis not present

## 2019-08-24 DIAGNOSIS — R278 Other lack of coordination: Secondary | ICD-10-CM | POA: Diagnosis not present

## 2019-08-24 DIAGNOSIS — Y92129 Unspecified place in nursing home as the place of occurrence of the external cause: Secondary | ICD-10-CM | POA: Diagnosis not present

## 2019-08-24 LAB — BASIC METABOLIC PANEL
Anion gap: 7 (ref 5–15)
BUN: 10 mg/dL (ref 8–23)
CO2: 21 mmol/L — ABNORMAL LOW (ref 22–32)
Calcium: 10.3 mg/dL (ref 8.9–10.3)
Chloride: 113 mmol/L — ABNORMAL HIGH (ref 98–111)
Creatinine, Ser: 0.9 mg/dL (ref 0.44–1.00)
GFR calc Af Amer: >60 mL/min (ref >60)
GFR calc non Af Amer: 55 mL/min — ABNORMAL LOW (ref >60)
Glucose, Bld: 84 mg/dL (ref 70–99)
Potassium: 3.4 mmol/L — ABNORMAL LOW (ref 3.5–5.1)
Sodium: 141 mmol/L (ref 135–145)

## 2019-08-24 LAB — CBC
HCT: 34 % — ABNORMAL LOW (ref 36.0–46.0)
Hemoglobin: 11.6 g/dL — ABNORMAL LOW (ref 12.0–15.0)
MCH: 27 pg (ref 26.0–34.0)
MCHC: 34.1 g/dL (ref 30.0–36.0)
MCV: 79.3 fL — ABNORMAL LOW (ref 80.0–100.0)
Platelets: 113 10*3/uL — ABNORMAL LOW (ref 150–400)
RBC: 4.29 MIL/uL (ref 3.87–5.11)
RDW: 18.4 % — ABNORMAL HIGH (ref 11.5–15.5)
WBC: 4.2 10*3/uL (ref 4.0–10.5)
nRBC: 0.5 % — ABNORMAL HIGH (ref 0.0–0.2)

## 2019-08-24 LAB — SARS CORONAVIRUS 2 BY RT PCR (HOSPITAL ORDER, PERFORMED IN ~~LOC~~ HOSPITAL LAB): SARS Coronavirus 2: NEGATIVE

## 2019-08-24 MED ORDER — PANTOPRAZOLE SODIUM 40 MG PO TBEC: 40.0000 mg | DELAYED_RELEASE_TABLET | Freq: Two times a day (BID) | ORAL | 0 refills | Status: DC

## 2019-08-24 MED ORDER — POTASSIUM CHLORIDE CRYS ER 20 MEQ PO TBCR
40.0000 meq | EXTENDED_RELEASE_TABLET | Freq: Once | ORAL | Status: AC
  Administered 2019-08-24: 40 meq via ORAL
  Filled 2019-08-24: qty 2

## 2019-08-24 MED ORDER — LEVETIRACETAM ER 500 MG PO TB24: 500.0000 mg | ORAL_TABLET | Freq: Two times a day (BID) | ORAL | 0 refills | Status: DC

## 2019-08-24 NOTE — Progress Notes (Signed)
Reported attempted to facility. Was placed on hold for 15+ minutes. Will attempt to call again later.

## 2019-08-24 NOTE — Progress Notes (Signed)
VASCULAR LAB PRELIMINARY  PRELIMINARY  PRELIMINARY  PRELIMINARY  Carotid duplex completed.    Preliminary report:  See CV proc for preliminary results.  Tinita Brooker, RVT 08/24/2019, 10:25 AM

## 2019-08-24 NOTE — Progress Notes (Signed)
Family Medicine Teaching Service Daily Progress Note Intern Pager: [phone]  [name] Medical record number: [id number] Date of birth: January 05, 1924 Age: 85 y.o. Gender: female  Primary Care Provider: Danna Hefty, DO Consultants: Cardiology  Code Status: DNR  Pt Overview and Major Events to Date:  Admitted to [facility] on 10/8 2U PRBCs on 10/8  Neurology consult 10/10  Assessment and Plan: CARESS GIACOMINI a 85 y.o.femalepresenting with anemia after 2 falls one in which she sustained a head laceration that has been repaired. PMH is significant foranemia, breast CA s/p bilat mastectomy, CAD,hxcarotid artery aneurysm, CHF, colon CA, DM, GERD, HLD, HTN.   Seizure-like activity Patient with apparent upward gaze and tremors of bilateral upper extremity.  Neurology was consulted and evaluate for possible seizure-like activity.  MR angios head, MR brain without significant acute intracranial abnormality.  Does show atrophy and advanced chronic small vessel ischemic disease.  EEG still pending.  Seizure likely in setting of dementia, and unclear how beneficial treatment will be at this time.  Started on [place] by neurology. -EEG pending -Bilateral carotid Dopplers pending -Keppra status post 750 mg loading dose, 500 mg twice daily -Follow-up neurology recs  Severe Anemia 2/2 to GI Bleed Hx of IDA Hemoglobin stable 11.6.  Admission hemoglobin 5.4, rebounded to 10.7 status post 2 units packed red blood cells.  Per patient's request no invasive procedures requested.  Can have CBC rechecked at hospital follow-up.  If stable can restart aspirin. -Continue cardiac monitoring -Continue Protonix 40 mg twice daily -Monitor for signs of bleeding -checking CBC no longer indicated  Hypokalemia Potassium 3.4.  Gave K-Dur 40 mEq.  Recheck BMP on 10/12.  If resolved would likely consider discontinuing BMP checks.  Elevated troponins Felt to be demand ischemia secondary to anemia  on admission.  Cardiology recommended holding off on aspirin until hemoglobin normalizes.  Can recheck CBC at clinic follow-up and restart aspirin as stable. -Aspirin management as above -Cardiology signed off  Recent Fall  Physical therapy evaluated patient yesterday and recommended SNF placement. -Fall precautions -PT/OT for eval  AKI-resolved Cr 0.9 this a.m.   Baseline less than 1.  Still on normal saline at 75 mL/h.  Given the resolved AKI KVO fluids. -KVO IV fluids -BMP on 10/12, likely stat BMP checked afterwards  Dementia  Patient remains at baseline and orientated to self and situation -Continue Aricept   T2DM Glucose on BMP 106 -continue to monitor   Asthma  Home meds: albuterol  -Albuterol PRN  Tobacco Use Disorder  Patient reported to have last used dip tobacco 1 month ago. -Encourage smoking cessation.  HTN  Normotensive. BP 136/55. Home antihypertensives include coreg, amlodipine 5mg , Cozaar 50mg , losartan 50mg    -add home meds as BP tolerates   HFpEF Euvolemic on exam. Home medication include Coreg 12 -Daily weight -Strict I's and O's -Continue to call cardiac  CAD  -Continue Crestor -Hold ASA  Insomnia No issues sleeping overnight -Continue to hold trazodone   GERD  Symptomatic -Continue Protonix 40 twice daily  Protein calorie malnutrition  Albumin 3.0 on admission. Takes supplements at home  -nutrition consulted  FEN/GI: Regular diet  PPx: SCDs  Disposition: Pending SNF placement  Subjective:  Feels well this a.m.  Does not remember the shaking abnormality from 10/10.  Otherwise no issues  Objective: Temp:  [97.7 F (36.5 C)-98.4 F (36.9 C)] 97.7 F (36.5 C) (10/11 0817) Pulse Rate:  [67-118] 67 (10/11 0817) Resp:  [18-20] 18 (10/11 0817)  BP: (158-187)/(65-93) 164/67 (10/11 0817) SpO2:  [98 %-100 %] 99 % (10/11 0817) Physical Exam: General: Very pleasant 85 year old African-American female, no acute distress,  resting comfortably Cardiovascular: Regular rate and rhythm, no M/R/G.  Skin warm and dry Respiratory: Lungs clear to auscultation bilaterally, no accessory muscle use Abdomen: Soft, nontender, nondistended, bowel sounds normal Extremities: No edema, nontender  Laboratory: Recent Labs  Lab 08/23/19 0346 08/23/19 1233 08/24/19 0528  WBC 5.3 6.6 4.2  HGB 9.6* 11.7* 11.6*  HCT 28.1* 33.4* 34.0*  PLT 135* PLATELET CLUMPS NOTED ON SMEAR, UNABLE TO ESTIMATE 113*   Recent Labs  Lab 08/21/19 1553  08/23/19 0135 08/23/19 0346 08/24/19 0528  NA 135   < > 139 139 141  K 4.9   < > 3.6 3.5 3.4*  CL 110   < > 116* 115* 113*  CO2 18*   < > 17* 16* 21*  BUN 78*   < > 26* 24* 10  CREATININE 1.53*   < > 1.05* 1.03* 0.90  CALCIUM 11.3*   < > 10.0 9.9 10.3  PROT 6.5  --   --   --   --   BILITOT 0.3  --   --   --   --   ALKPHOS 47  --   --   --   --   ALT 6  --   --   --   --   AST 15  --   --   --   --   GLUCOSE 182*   < > 77 106* 84   < > = values in this interval not displayed.    Ref. Range 08/22/2019 20:57 08/22/2019 23:15 08/23/2019 01:35  Troponin I (High Sensitivity) Latest Ref Range: <18 ng/L 303 (HH) 265 (HH) 288 [facility])    Imaging/Diagnostic Tests: No new imaging   Guadalupe Dawn, MD 08/24/2019, 8:49 AM PGY-3, [place] Intern pager: [phone], text pages welcome

## 2019-08-24 NOTE — Procedures (Addendum)
Patient Name: CLAUDINE AKOPYAN  MRN: [id number]  Epilepsy Attending: Lora Havens  Referring Physician/Provider: Eunice Blase, PA Date: 08/24/2019 Duration: 26.12 mins  Patient history: 85yo F with seizure like activity. EEG to evaluate for seizure.  Level of alertness: awake, asleep  AEDs during EEG study: Keppra  Technical aspects: This EEG study was done with scalp electrodes positioned according to the 10-20 International system of electrode placement. Electrical activity was acquired at a sampling rate of 500Hz  and reviewed with a high frequency filter of 70Hz  and a low frequency filter of 1Hz . EEG data were recorded continuously and digitally stored.   DESCRIPTION: The posterior dominant rhythm consists of 8-9 Hz activity of moderate voltage (25-35 uV) seen predominantly in posterior head regions, symmetric and reactive to eye opening and eye closing. Sleep was characterized by vertex waves, sleep spindles ( 12-14hz ), maximal frontocentral. EEG also showed intermittent generalized 3-5Hz  theta-delta slowing,maximal bitemporal. Physiologic photic driving was seen during photic stimulation. Hyperventilation was not performed.  ABNORMALITY - Intermittent slow, generalized, maximal bitemporal  IMPRESSION: This study is suggestive of bitemporal cortical dysfunction and mild diffuse encephalopathy, non specific etiology. No seizures or epileptiform discharges were seen throughout the recording.  Mitsugi Schrader Barbra Sarks

## 2019-08-24 NOTE — Discharge Summary (Signed)
[facility] Discharge Summary  [name] Medical record number: [id number] Date of birth: 1924-04-10 Age: 85 y.o. Gender: female Date of Admission: 08/21/2019  Date of Discharge: 08/24/2019 Admitting Physician: Leeanne Rio, MD  Primary Care Provider: Danna Hefty, DO Consultants: Cardiology, curbside GI   Indication for Hospitalization: anemia  Discharge Diagnoses/Problem List:  Severe Anemia 2/2 GI Bleed, h/o of IDA  Elevated troponins Recent Fall AKI-resolved Dementia T2DM Asthma  Tobacco Use Disorder HTN HFpEF CAD Insomnia GERD Protein calorie malnutrition   Disposition: improving   Discharge Condition: stable   Discharge Exam:  General: Awake and alert, laying comfortably in bed, no apparent distress Cardiovascular: Regular rate and rhythm, no murmurs rubs or gallops Respiratory: Clear to auscultation bilaterally, no wheezing, rales, rhonchi Abdomen: Soft, nontender, nondistended, bowel sounds normal Extremities: No edema, nontender  Brief Hospital Course:  [name] is a 85 y.o. female presenting with anemia, with Hgb of 5.4. PMHx is significant for anemia, breast CA s/p bilat mastectomy, CAD, hx carotid artery aneurysm, CHF, colon CA, DM, GERD, HLD, HTN.   Anemia likely 2/2 GI bleed Patient initially present to [place] ED after fall from nursing home. At time of presentation patient's Hgb was 5.4, repeat Hgb 5.8.  Patient also with positive FOBT.  Patient was slightly tachycardic and became hypotensive to 91/54.  The decision was made to transfer to [facility] emergency department for blood transfusion.  While in the emergency department at [facility] patient received 2 units packed red blood cells as well as started on Protonix drip.  It was discussed with both patient and her daughter, who is power of attorney, that she would not want aggressive measures.  Did agree to blood transfusions.  Patient  improved with 2 units to a hemoglobin of 10.4.  Continue to improve and hemoglobin on discharge was 9.6.  On date of discharge patient's outpatient GI doctor was consulted for a curbside consult just to call patient's daughter and explain further work-up.  Of note patient's aspirin and blood pressure medications were discontinued in the setting of acute GI bleed.  Will leave to PCP to restart as needed.  Per cardiology attending attestation, recommend holding aspirin until hemoglobin normalizes.  Elevated troponins Patient with elevated troponin on admission.  Patient was started at 40 and went up as high as 303.  Troponins were continue to trend and showed decline. Last three troponins [phone].  Cardiology was consulted during admission and thought that troponin leak was secondary to demand ischemia from anemia. Patient without chest pain and EKG without ST changes.   Recent falls Patient with multiple falls at her nursing facility.  Unclear etiology.  Patient does not remember that and daughter states she was not informed.  PT and OT were consulted.  PT recommended SNF however patient states she would like to be discharged home.  Patient's daughter also agreed to this and stated she has 24-hour supervision there.  Patient's daughter was informed prior to discharge.  Home health PT was ordered for patient.  Seizure-like activity On 10/10 patient had multiple episodes of bilateral upper extremity tremor and staring off into space.  She is evaluated by neurology started on Keppra.  She underwent MRI/MRI brain which showed advanced small vessel disease and age-appropriate atrophy.  She underwent bilateral carotid Dopplers, EEG which showed no evidence of seizure like activity.  However, neurology is recommending continuing Keppra 500 mg twice daily on discharge.  Issues for  Follow Up:  1. ASA held in the setting of GI bleed, consider restarting given h/o CAD 2. Monitor Hgb 3. BP meds held, consider  restarting 4. protonix changed to BID  Significant Procedures: None  Significant Labs and Imaging:  Recent Labs  Lab 08/23/19 0346 08/23/19 1233 08/24/19 0528  WBC 5.3 6.6 4.2  HGB 9.6* 11.7* 11.6*  HCT 28.1* 33.4* 34.0*  PLT 135* PLATELET CLUMPS NOTED ON SMEAR, UNABLE TO ESTIMATE 113*   Recent Labs  Lab 08/21/19 1553 08/22/19 0925 08/23/19 0135 08/23/19 0346 08/24/19 0528  NA 135 143 139 139 141  K 4.9 4.3 3.6 3.5 3.4*  CL 110 116* 116* 115* 113*  CO2 18* 18* 17* 16* 21*  GLUCOSE 182* 89 77 106* 84  BUN 78* 46* 26* 24* 10  CREATININE 1.53* 1.24* 1.05* 1.03* 0.90  CALCIUM 11.3* 10.6* 10.0 9.9 10.3  ALKPHOS 47  --   --   --   --   AST 15  --   --   --   --   ALT 6  --   --   --   --   ALBUMIN 3.0*  --   --   --   --     Ref. Range 08/21/2019 15:53 08/21/2019 18:30 08/22/2019 09:25 08/22/2019 13:10 08/22/2019 20:57 08/22/2019 23:15 08/23/2019 01:35 08/23/2019 03:46 08/23/2019 05:46  Troponin I (High Sensitivity) Latest Ref Range: <18 ng/L 35 (H) 38 (H) 189 (HH) 217 (HH) 303 (HH) 265 (HH) 288 (HH) 282 (HH) 244 (HH)   Dg Ribs Unilateral W/chest Left  Result Date: 08/21/2019 CLINICAL DATA:  Fall today from chair at nursing home landing on left side. EXAM: LEFT RIBS AND CHEST - 3+ VIEW COMPARISON:  Chest x-ray 05/02/2019 FINDINGS: Lungs are clear. No evidence of pneumothorax. Cardiomediastinal silhouette is within normal. There are degenerative changes of the spine. No evidence of acute fracture. IMPRESSION: No acute findings. Electronically Signed   By: Marin Olp M.D.   On: 08/21/2019 17:27   Ct Head Wo Contrast  Result Date: 08/23/2019 CLINICAL DATA:  Altered mental status. Possible seizure activity. EXAM: CT HEAD WITHOUT CONTRAST TECHNIQUE: Contiguous axial images were obtained from the base of the skull through the vertex without intravenous contrast. COMPARISON:  08/21/2019 FINDINGS: Brain: There is no evidence of acute infarct, intracranial hemorrhage, mass, midline shift,  or extra-axial fluid collection. Mild ventriculomegaly is unchanged and attributed to cerebral atrophy. Chronic lacunar infarcts are again seen in the deep gray nuclei. Bilateral cerebral white matter hypodensities are unchanged and nonspecific but compatible with chronic small vessel ischemic disease, mild for age. Vascular: Extensive calcified atherosclerosis at the skull base. No hyperdense vessel. Skull: No fracture or focal osseous lesion. Sinuses/Orbits: Visualized paranasal sinuses and mastoid air cells are clear. Bilateral cataract extraction is noted. Other: Persistent small left frontal scalp hematoma. IMPRESSION: 1. No evidence of acute intracranial abnormality. 2. Mild chronic small vessel ischemic disease. 3. Small left frontal scalp hematoma. Electronically Signed   By: Logan Bores M.D.   On: 08/23/2019 14:49   Ct Head Wo Contrast  Result Date: 08/21/2019 CLINICAL DATA:  Fall. EXAM: CT HEAD WITHOUT CONTRAST CT CERVICAL SPINE WITHOUT CONTRAST TECHNIQUE: Multidetector CT imaging of the head and cervical spine was performed following the standard protocol without intravenous contrast. Multiplanar CT image reconstructions of the cervical spine were also generated. COMPARISON:  CT head and cervical spine dated Mar 14, 2019. FINDINGS: CT HEAD FINDINGS Brain: No evidence of acute infarction, hemorrhage, hydrocephalus, extra-axial  collection or mass lesion/mass effect. Stable atrophy and chronic microvascular ischemic changes. Vascular: Calcified atherosclerosis at the skullbase. No hyperdense vessel. Skull: Negative for fracture or focal lesion. Sinuses/Orbits: No acute finding. Other: Small left forehead scalp hematoma and laceration. CT CERVICAL SPINE FINDINGS Alignment: No traumatic malalignment. Skull base and vertebrae: No acute fracture. No primary bone lesion or focal pathologic process. Soft tissues and spinal canal: No prevertebral fluid or swelling. No visible canal hematoma. Disc levels:  Overall moderate diffuse cervical spondylosis, similar to prior study. Upper chest: Minimal centrilobular emphysema. Other: None. IMPRESSION: 1. No acute intracranial abnormality. Small left forehead scalp hematoma and laceration. 2.  No acute cervical spine fracture. Electronically Signed   By: Titus Dubin M.D.   On: 08/21/2019 15:57   Ct Cervical Spine Wo Contrast  Result Date: 08/21/2019 CLINICAL DATA:  Fall. EXAM: CT HEAD WITHOUT CONTRAST CT CERVICAL SPINE WITHOUT CONTRAST TECHNIQUE: Multidetector CT imaging of the head and cervical spine was performed following the standard protocol without intravenous contrast. Multiplanar CT image reconstructions of the cervical spine were also generated. COMPARISON:  CT head and cervical spine dated Mar 14, 2019. FINDINGS: CT HEAD FINDINGS Brain: No evidence of acute infarction, hemorrhage, hydrocephalus, extra-axial collection or mass lesion/mass effect. Stable atrophy and chronic microvascular ischemic changes. Vascular: Calcified atherosclerosis at the skullbase. No hyperdense vessel. Skull: Negative for fracture or focal lesion. Sinuses/Orbits: No acute finding. Other: Small left forehead scalp hematoma and laceration. CT CERVICAL SPINE FINDINGS Alignment: No traumatic malalignment. Skull base and vertebrae: No acute fracture. No primary bone lesion or focal pathologic process. Soft tissues and spinal canal: No prevertebral fluid or swelling. No visible canal hematoma. Disc levels: Overall moderate diffuse cervical spondylosis, similar to prior study. Upper chest: Minimal centrilobular emphysema. Other: None. IMPRESSION: 1. No acute intracranial abnormality. Small left forehead scalp hematoma and laceration. 2.  No acute cervical spine fracture. Electronically Signed   By: Titus Dubin M.D.   On: 08/21/2019 15:57   Mr Angio Head Wo Contrast  Result Date: 08/24/2019 CLINICAL DATA:  Syncope and collapse EXAM: MRI HEAD WITHOUT CONTRAST MRA HEAD WITHOUT  CONTRAST TECHNIQUE: Multiplanar, multiecho pulse sequences of the brain and surrounding structures were obtained without intravenous contrast. Angiographic images of the head were obtained using MRA technique without contrast. COMPARISON:  Head CT 08/23/2019 FINDINGS: MRI HEAD FINDINGS BRAIN: There is no acute infarct, acute hemorrhage or extra-axial collection. Diffuse confluent hyperintense T2-weighted signal within the periventricular, deep and juxtacortical white matter, most commonly due to chronic ischemic microangiopathy. There is generalized atrophy without lobar predilection. There are multiple old small vessel infarcts of the deep gray nuclei. The midline structures are normal. VASCULAR: The major intracranial arterial and venous sinus flow voids are normal. Two foci of supratentorial chronic microhemorrhage. SKULL AND UPPER CERVICAL SPINE: Calvarial bone marrow signal is normal. There is no skull base mass. The visualized upper cervical spine and soft tissues are normal. SINUSES/ORBITS: There are no fluid levels or advanced mucosal thickening. The mastoid air cells and middle ear cavities are free of fluid. The orbits are normal. MRA HEAD FINDINGS The angiographic portion of the study is moderately motion degraded. POSTERIOR CIRCULATION: --Vertebral arteries: Patent V4 segments. --Posterior inferior cerebellar arteries (PICA): Poor visualization due to motion. --Anterior inferior cerebellar arteries (AICA): Poor visualization due to motion. --Basilar artery: Normal. --Superior cerebellar arteries: Normal. --Posterior cerebral arteries: Normal. Both originate from the basilar artery. Posterior communicating arteries (p-comm) are diminutive or absent. ANTERIOR CIRCULATION: --Intracranial internal carotid arteries: Multifocal  irregularity, likely due to atherosclerosis, though probably exaggerated by motion. --Anterior cerebral arteries (ACA): Patent --Middle cerebral arteries (MCA): patent IMPRESSION: 1. No  acute intracranial abnormality. 2. Atrophy, advanced chronic small vessel ischemic microangiopathy and multiple old deep gray nuclei infarcts. 3. Moderately motion degraded MRA. No emergent large vessel occlusion. Electronically Signed   By: Ulyses Jarred M.D.   On: 08/24/2019 05:13   Mr Brain Wo Contrast  Result Date: 08/24/2019 CLINICAL DATA:  Syncope and collapse EXAM: MRI HEAD WITHOUT CONTRAST MRA HEAD WITHOUT CONTRAST TECHNIQUE: Multiplanar, multiecho pulse sequences of the brain and surrounding structures were obtained without intravenous contrast. Angiographic images of the head were obtained using MRA technique without contrast. COMPARISON:  Head CT 08/23/2019 FINDINGS: MRI HEAD FINDINGS BRAIN: There is no acute infarct, acute hemorrhage or extra-axial collection. Diffuse confluent hyperintense T2-weighted signal within the periventricular, deep and juxtacortical white matter, most commonly due to chronic ischemic microangiopathy. There is generalized atrophy without lobar predilection. There are multiple old small vessel infarcts of the deep gray nuclei. The midline structures are normal. VASCULAR: The major intracranial arterial and venous sinus flow voids are normal. Two foci of supratentorial chronic microhemorrhage. SKULL AND UPPER CERVICAL SPINE: Calvarial bone marrow signal is normal. There is no skull base mass. The visualized upper cervical spine and soft tissues are normal. SINUSES/ORBITS: There are no fluid levels or advanced mucosal thickening. The mastoid air cells and middle ear cavities are free of fluid. The orbits are normal. MRA HEAD FINDINGS The angiographic portion of the study is moderately motion degraded. POSTERIOR CIRCULATION: --Vertebral arteries: Patent V4 segments. --Posterior inferior cerebellar arteries (PICA): Poor visualization due to motion. --Anterior inferior cerebellar arteries (AICA): Poor visualization due to motion. --Basilar artery: Normal. --Superior cerebellar  arteries: Normal. --Posterior cerebral arteries: Normal. Both originate from the basilar artery. Posterior communicating arteries (p-comm) are diminutive or absent. ANTERIOR CIRCULATION: --Intracranial internal carotid arteries: Multifocal irregularity, likely due to atherosclerosis, though probably exaggerated by motion. --Anterior cerebral arteries (ACA): Patent --Middle cerebral arteries (MCA): patent IMPRESSION: 1. No acute intracranial abnormality. 2. Atrophy, advanced chronic small vessel ischemic microangiopathy and multiple old deep gray nuclei infarcts. 3. Moderately motion degraded MRA. No emergent large vessel occlusion. Electronically Signed   By: Ulyses Jarred M.D.   On: 08/24/2019 05:13   Dg Abd Acute W/chest  Result Date: 08/10/2019 CLINICAL DATA:  Nausea and vomiting EXAM: DG ABDOMEN ACUTE W/ 1V CHEST COMPARISON:  None. FINDINGS: Normal mediastinum and cardiac silhouette. Normal pulmonary vasculature. No evidence of effusion, infiltrate, or pneumothorax. No acute bony abnormality. No dilated large or small bowel. No intraperitoneal free air. Gas in the rectum. Surgical clips in the lower pelvis. Degenerate changes of the spine.  Osteoarthritis of the hips. IMPRESSION: 1.  No acute cardiopulmonary process. 2. No bowel obstruction. Electronically Signed   By: Suzy Bouchard M.D.   On: 08/10/2019 14:54   Vas US Carotid  Result Date: 08/24/2019 Carotid Arterial Duplex Study Indications:       Syncope. Risk Factors:      Hypertension, hyperlipidemia, Diabetes. Comparison Study:  Prior study from 01/28/15 is available for comparison Performing Technologist: Sharion Dove RVS  Examination Guidelines: A complete evaluation includes B-mode imaging, spectral Doppler, color Doppler, and power Doppler as needed of all accessible portions of each vessel. Bilateral testing is considered an integral part of a complete examination. Limited examinations for reoccurring indications may be performed as  noted.  Right Carotid Findings: +----------+--------+--------+--------+------------------+------------------+  PSV cm/sEDV cm/sStenosisPlaque DescriptionComments           +----------+--------+--------+--------+------------------+------------------+ CCA Prox  51      12                                intimal thickening +----------+--------+--------+--------+------------------+------------------+ CCA Distal51      3                                 intimal thickening +----------+--------+--------+--------+------------------+------------------+ ICA Prox  58      17              heterogenous                         +----------+--------+--------+--------+------------------+------------------+ ICA Distal76      21                                                   +----------+--------+--------+--------+------------------+------------------+ ECA       81      6                                                    +----------+--------+--------+--------+------------------+------------------+ +----------+--------+-------+--------+-------------------+           PSV cm/sEDV cmsDescribeArm Pressure (mmHG) +----------+--------+-------+--------+-------------------+ [id number]                                         +----------+--------+-------+--------+-------------------+ +---------+--------+--+--------+--+ VertebralPSV cm/s71EDV cm/s16 +---------+--------+--+--------+--+  Left Carotid Findings: +----------+--------+--------+--------+------------------+------------------+           PSV cm/sEDV cm/sStenosisPlaque DescriptionComments           +----------+--------+--------+--------+------------------+------------------+ CCA Prox  86      13                                intimal thickening +----------+--------+--------+--------+------------------+------------------+ CCA Distal63      11                                intimal thickening  +----------+--------+--------+--------+------------------+------------------+ ICA Prox  42      13              heterogenous                         +----------+--------+--------+--------+------------------+------------------+ ICA Distal57      14                                                   +----------+--------+--------+--------+------------------+------------------+ ECA       40      4                                                    +----------+--------+--------+--------+------------------+------------------+ +----------+--------+--------+--------+-------------------+  PSV cm/sEDV cm/sDescribeArm Pressure (mmHG) +----------+--------+--------+--------+-------------------+ Subclavian114                                         +----------+--------+--------+--------+-------------------+ +---------+--------+--+--------+--+ VertebralPSV cm/s37EDV cm/s11 +---------+--------+--+--------+--+  Summary: Right Carotid: The extracranial vessels were near-normal with only minimal wall                thickening or plaque. No significant change since study done                01/28/15. Left Carotid: The extracranial vessels were near-normal with only minimal wall               thickening or plaque. No significant change since study done               01/28/15. Vertebrals:  Bilateral vertebral arteries demonstrate antegrade flow. Subclavians: Normal flow hemodynamics were seen in bilateral subclavian              arteries. *See table(s) above for measurements and observations.  Electronically signed by Antony Contras MD on 08/24/2019 at 11:29:51 AM.    Final      Results/Tests Pending at Time of Discharge:  Unresulted Labs (From admission, onward)    Start     Ordered   08/25/19 [id number]  Basic metabolic panel  Tomorrow morning,   R    Question:  Specimen collection method  Answer:  Lab=Lab collect   08/24/19 0924   08/24/19 1355  SARS Coronavirus 2 by RT PCR (hospital  order, performed in [place] hospital lab) Nasopharyngeal Nasopharyngeal Swab  (Symptomatic/High Risk of Exposure/Tier 1 Patients Labs with Precautions)  Once,   R    Question Answer Comment  Is this test for diagnosis or screening Screening   Symptomatic for COVID-19 as defined by CDC No   Hospitalized for COVID-19 No   Admitted to ICU for COVID-19 No   Previously tested for COVID-19 No   Resident in a congregate (group) care setting No   Employed in healthcare setting No   Pregnant No      08/24/19 1354   08/23/19 1342  Urinalysis, Routine w reflex microscopic  Once,   R     08/23/19 1342           Discharge Medications:  Allergies as of 08/24/2019      Reactions   Tape Other (See Comments)   Skin is very thin and will tear and bruise easily!!   Lisinopril Cough      Medication List    STOP taking these medications   amLODipine 5 MG tablet Commonly known as: NORVASC   aspirin EC 325 MG tablet   carvedilol 12.5 MG tablet Commonly known as: COREG   cephALEXin 500 MG capsule Commonly known as: KEFLEX   losartan 50 MG tablet Commonly known as: COZAAR   potassium chloride SA 20 MEQ tablet Commonly known as: KLOR-CON   ranitidine 150 MG capsule Commonly known as: ZANTAC     TAKE these medications   acetaminophen 500 MG tablet Commonly known as: TYLENOL Take 500 mg by mouth every 6 (six) hours as needed for moderate pain.   albuterol 108 (90 Base) MCG/ACT inhaler Commonly known as: VENTOLIN HFA Inhale 2 puffs into the lungs every 6 (six) hours as needed. For shortness of breath   diclofenac sodium 1 % Gel Commonly known as: VOLTAREN Apply 2  g topically 4 (four) times daily.   donepezil 10 MG tablet Commonly known as: Aricept Take 1 tablet (10 mg total) by mouth at bedtime.   feeding supplement (PRO-STAT SUGAR FREE 64) Liqd Take 30 mLs by mouth 2 (two) times daily with a meal. ensure   ferrous sulfate 325 (65 FE) MG tablet Take 1 tablet (325 mg  total) by mouth daily with breakfast.   fluticasone 50 MCG/ACT nasal spray Commonly known as: FLONASE PLACE 1 SPRAY INTO BOTH NOSTRILS DAILY.   levETIRAcetam 500 MG 24 hr tablet Commonly known as: KEPPRA XR Take 1 tablet (500 mg total) by mouth 2 (two) times daily.   pantoprazole 40 MG tablet Commonly known as: PROTONIX Take 1 tablet (40 mg total) by mouth 2 (two) times daily. What changed: when to take this   pantoprazole 40 MG tablet Commonly known as: PROTONIX Take 1 tablet (40 mg total) by mouth 2 (two) times daily. What changed: You were already taking a medication with the same name, and this prescription was added. Make sure you understand how and when to take each.   polyethylene glycol 17 g packet Commonly known as: MIRALAX / GLYCOLAX Take 17 g by mouth daily.   rosuvastatin 20 MG tablet Commonly known as: CRESTOR TAKE 1 TABLET BY MOUTH EVERYDAY AT BEDTIME What changed: See the new instructions.   senna-docusate 8.6-50 MG tablet Commonly known as: Senokot-S Take 2 tablets by mouth at bedtime. What changed:   how much to take  when to take this  reasons to take this   traZODone 100 MG tablet Commonly known as: DESYREL TAKE 1 TABLET (100 MG TOTAL) BY MOUTH AT BEDTIME AS NEEDED FOR SLEEP. What changed: when to take this       Discharge Instructions: Please refer to Patient Instructions section of EMR for full details.  Patient was counseled important signs and symptoms that should prompt return to medical care, changes in medications, dietary instructions, activity restrictions, and follow up appointments.   Follow-Up Appointments: Follow-up Information    Guadalupe Dawn, MD. Go on 08/27/2019.   Specialty: Family Medicine Why: @1 :50PM (Please arrive 15-20 min prior to appointment time). If you have a cough, fever, or shortness of breath please call before arriving Contact information: [address] [place] [address] [phone]            [name], MD 08/24/2019, 3:47 PM PGY-2, [place]

## 2019-08-24 NOTE — Progress Notes (Signed)
EEG complete - results pending 

## 2019-08-24 NOTE — TOC Transition Note (Signed)
Transition of Care [facility]) - CM/SW Discharge Note   Patient Details  Name: [name] MRN: [id number] Date of Birth: 1924/05/09  Transition of Care [facility]) CM/SW Contact:  Gelene Mink, [place] Phone Number: 08/24/2019, 4:05 PM   Clinical Narrative:     Patient will DC to: [facility] Anticipated DC date: 08/24/2019 Family notified: Yes Transport by: Corey Harold   Per MD patient ready for DC to . RN, patient, patient's family, and facility notified of DC. Discharge Summary and FL2 sent to facility. RN to call report prior to discharge [phone]). The patient will go to room 3250. DC packet on chart. Ambulance transport requested for patient.   CSW will sign off for now as social work intervention is no longer needed. Please consult [place] again if new needs arise.  Kalix Meinecke, LCSW-A [place]/Clinical Social Work Department Cell: [phone]    Final next level of care: [place] Barriers to Discharge: No Barriers Identified   Patient Goals and CMS Choice Patient states their goals for this hospitalization and ongoing recovery are:: Pt will go to [facility] for rehab CMS Medicare.gov Compare Post Acute Care list provided to:: Patient Represenative (must comment) Choice offered to / list presented to : Adult Children  Discharge Placement   Existing PASRR number confirmed : 08/23/19          Patient chooses bed at: [facility] Patient to be transferred to facility by: [facility] Name of family member notified: [name] Patient and family notified of of transfer: 08/24/19  Discharge Plan and Services In-house Referral: Clinical Social Work Discharge Planning Services: NA Post Acute Care Choice: [place]          DME Arranged: N/A DME Agency: NA       HH Arranged: NA [place] Agency: NA        Social Determinants of Health ([place]) Interventions     Readmission Risk Interventions Readmission Risk Prevention Plan  08/12/2019 01/25/2019  Transportation Screening Complete -  PCP or Specialist Appt within 3-5 Days Not Complete Complete  Not Complete comments Will transition to a SNF -  [place] or [place] Complete Complete  Social Work Consult for [place] Planning/Counseling Complete -  Palliative Care Screening Not Applicable Not Applicable  Medication Review Press photographer) Complete Complete  Some recent data might be hidden

## 2019-08-24 NOTE — Progress Notes (Signed)
Neurology progress note  Subjective: Seen and examined.  No complaints  Objective: Vital signs stable Awake alert oriented to self. Able to carry a full conversation. No evidence of aphasia No evidence of dysarthria Cranial nerves II to XII without deficit Motor exam: Grossly symmetric antigravity strength in all 4 extremities Sensation intact No dysmetria noted  Diagnostics/labs/imaging Imaging reviewed by me personally- CT head with no acute changes. Labs reviewed-urinalysis with questionable UTI. MRI MRA unremarkable for acute stroke or emergent LVO.  Basilar artery looks open. Carotid Dopplers and EEG pending.   Assessment 85 year old woman past medical history of coronary arteries, CHF, colon cancer, diabetes, MI, GI bleed, documented vertebrobasilar insufficiency, breast cancer status post mastectomy and dementia without behavioral disturbance admitted for management of severe anemia had a seizure-like episode this morning.  Chart review reveals she had a similar episode with left facial weakness sometime in 2018 as well. I would consider this as seizure in the setting of underlying dementia and would recommend continuing antiepileptic treatment. Imaging negative for stroke. EEG has been completed-results pending.   Recommendations: -Follow-up EEG report-call if any concern - Follow-up carotid Dopplers-unlikely to have any bearing on current presentation. - Continue Keppra 500 twice daily - Medical management per primary team as you are.  -- Amie Portland, MD [facility] Pager: [phone] If 7pm to 7am, please call on call as listed on AMION.

## 2019-08-24 NOTE — TOC Progression Note (Signed)
Transition of Care [facility]) - Progression Note    Patient Details  Name: ARKISHA PENFOLD MRN: [id number] Date of Birth: 1924-10-11  Transition of Care [facility]) CM/SW [place], [place] Phone Number: 08/24/2019, 9:35 AM  Clinical Narrative:     CSW called patient's daughter [name] and provided bed offer to [facility]. She accepted and will be completing admissions paperwork today at 12:00pm.   CSW called and spoke with MD and said that she would not be ready today.   CSW informed Janie, Engineer, site and informed her that patient not medically stable for discharge today. Narda Rutherford stated that if she is ready on Monday she would be able to come. After that she cannot guarantee a SNF bed because they are not able to hold beds.   Patient will need updated COVID before discharge. CSW will continue to follow and assist with discharge planning.   Expected Discharge Plan: Skilled Nursing Facility Barriers to Discharge: SNF Pending bed offer  Expected Discharge Plan and Services Expected Discharge Plan: [place] In-house Referral: Clinical Social Work Discharge Planning Services: NA Post Acute Care Choice: [place] Living arrangements for the past 2 months: [place], [place] Expected Discharge Date: 08/23/19               DME Arranged: N/A DME Agency: NA                   Social Determinants of Health (SDOH) Interventions    Readmission Risk Interventions Readmission Risk Prevention Plan 08/12/2019 01/25/2019  Transportation Screening Complete -  PCP or Specialist Appt within 3-5 Days Not Complete Complete  Not Complete comments Will transition to a SNF -  [place] or [place] Complete Complete  Social Work Consult for [place] Planning/Counseling Complete -  Palliative Care Screening Not Applicable Not Applicable  Medication Review Press photographer) Complete Complete  Some recent data might be  hidden

## 2019-08-25 ENCOUNTER — Other Ambulatory Visit: Payer: Self-pay

## 2019-08-25 ENCOUNTER — Other Ambulatory Visit: Payer: Self-pay | Admitting: *Deleted

## 2019-08-25 NOTE — Consult Note (Signed)
Follow up:  Patient changed to [facility] skilled facility. Notified PAC and THN RNCM to close pending status for [facility] Telephonic RNCM follow up.  Natividad Brood, RN BSN [facility] Liaison  [phone] business mobile phone Toll free office [phone]  Fax number: [phone] [email or web address]

## 2019-08-25 NOTE — Patient Outreach (Signed)
[facility]) Care Management  08/25/2019  JENNYFER ABBY [birth year] [id number]   Member readmitted to the hospital 10/8-10/11 after a fall at [facility].  She was discharged back to SNF ([facility] instead of [facility]).  Will close case at this time but anticipate new referral pending disposition from SNF.  [name], [place], MSN [place] [phone]

## 2019-08-26 DIAGNOSIS — R569 Unspecified convulsions: Secondary | ICD-10-CM | POA: Diagnosis not present

## 2019-08-26 DIAGNOSIS — K922 Gastrointestinal hemorrhage, unspecified: Secondary | ICD-10-CM | POA: Diagnosis not present

## 2019-08-26 DIAGNOSIS — R296 Repeated falls: Secondary | ICD-10-CM | POA: Diagnosis not present

## 2019-08-26 DIAGNOSIS — D62 Acute posthemorrhagic anemia: Secondary | ICD-10-CM | POA: Diagnosis not present

## 2019-08-27 ENCOUNTER — Inpatient Hospital Stay: Payer: Medicare Other | Admitting: Family Medicine

## 2019-08-27 DIAGNOSIS — K922 Gastrointestinal hemorrhage, unspecified: Secondary | ICD-10-CM | POA: Diagnosis not present

## 2019-08-27 DIAGNOSIS — I2510 Atherosclerotic heart disease of native coronary artery without angina pectoris: Secondary | ICD-10-CM | POA: Diagnosis not present

## 2019-08-27 DIAGNOSIS — D649 Anemia, unspecified: Secondary | ICD-10-CM | POA: Diagnosis not present

## 2019-08-27 DIAGNOSIS — I509 Heart failure, unspecified: Secondary | ICD-10-CM | POA: Diagnosis not present

## 2019-08-27 DIAGNOSIS — R569 Unspecified convulsions: Secondary | ICD-10-CM | POA: Diagnosis not present

## 2019-08-27 DIAGNOSIS — F0391 Unspecified dementia with behavioral disturbance: Secondary | ICD-10-CM | POA: Diagnosis not present

## 2019-08-29 DIAGNOSIS — R569 Unspecified convulsions: Secondary | ICD-10-CM | POA: Diagnosis not present

## 2019-08-29 DIAGNOSIS — R296 Repeated falls: Secondary | ICD-10-CM | POA: Diagnosis not present

## 2019-08-29 DIAGNOSIS — I2510 Atherosclerotic heart disease of native coronary artery without angina pectoris: Secondary | ICD-10-CM | POA: Diagnosis not present

## 2019-08-29 DIAGNOSIS — K922 Gastrointestinal hemorrhage, unspecified: Secondary | ICD-10-CM | POA: Diagnosis not present

## 2019-08-31 DIAGNOSIS — R569 Unspecified convulsions: Secondary | ICD-10-CM | POA: Diagnosis not present

## 2019-08-31 DIAGNOSIS — R296 Repeated falls: Secondary | ICD-10-CM | POA: Diagnosis not present

## 2019-08-31 DIAGNOSIS — F0390 Unspecified dementia without behavioral disturbance: Secondary | ICD-10-CM | POA: Diagnosis not present

## 2019-08-31 DIAGNOSIS — I10 Essential (primary) hypertension: Secondary | ICD-10-CM | POA: Diagnosis not present

## 2019-08-31 DIAGNOSIS — K922 Gastrointestinal hemorrhage, unspecified: Secondary | ICD-10-CM | POA: Diagnosis not present

## 2019-09-02 ENCOUNTER — Inpatient Hospital Stay: Payer: Medicare Other | Admitting: Family Medicine

## 2019-09-02 DIAGNOSIS — D500 Iron deficiency anemia secondary to blood loss (chronic): Secondary | ICD-10-CM | POA: Diagnosis not present

## 2019-09-02 DIAGNOSIS — R296 Repeated falls: Secondary | ICD-10-CM | POA: Diagnosis not present

## 2019-09-02 DIAGNOSIS — I10 Essential (primary) hypertension: Secondary | ICD-10-CM | POA: Diagnosis not present

## 2019-09-02 DIAGNOSIS — K922 Gastrointestinal hemorrhage, unspecified: Secondary | ICD-10-CM | POA: Diagnosis not present

## 2019-09-03 ENCOUNTER — Other Ambulatory Visit: Payer: Self-pay | Admitting: *Deleted

## 2019-09-03 DIAGNOSIS — R569 Unspecified convulsions: Secondary | ICD-10-CM | POA: Diagnosis not present

## 2019-09-03 DIAGNOSIS — K922 Gastrointestinal hemorrhage, unspecified: Secondary | ICD-10-CM | POA: Diagnosis not present

## 2019-09-03 DIAGNOSIS — R451 Restlessness and agitation: Secondary | ICD-10-CM | POA: Diagnosis not present

## 2019-09-03 DIAGNOSIS — D500 Iron deficiency anemia secondary to blood loss (chronic): Secondary | ICD-10-CM | POA: Diagnosis not present

## 2019-09-03 NOTE — Patient Outreach (Signed)
Member assessed for potential [facility] Care Management needs as a benefit of  [place] Medicare.  Member is currently receiving rehab therapy at [facility] SNF.  Member discussed in weekly telephonic IDT meeting with facility staff, [facility] UM team, and writer.  Facility reports the disposition plan is to return home with daughter.  Member was active with [place] Management prior to admission. Will continue to follow for disposition plans and progression while Mrs. Stidd resides at [facility] SNF.   Marthenia Rolling, MSN-Ed, RN,BSN [place] Acute Care Coordinator [phone] [facility]) [phone]  (Toll free office)

## 2019-09-05 ENCOUNTER — Telehealth: Payer: Self-pay

## 2019-09-05 NOTE — Telephone Encounter (Signed)
Pt daugher [name] called and LVM on nurse line. I returned the call to [phone]. [name] was not home. Asked the person who answered the phone to let her know I was returning her call. Ottis Stain, CMA

## 2019-09-09 DIAGNOSIS — I2510 Atherosclerotic heart disease of native coronary artery without angina pectoris: Secondary | ICD-10-CM | POA: Diagnosis not present

## 2019-09-09 DIAGNOSIS — Z9013 Acquired absence of bilateral breasts and nipples: Secondary | ICD-10-CM | POA: Diagnosis not present

## 2019-09-09 DIAGNOSIS — L89626 Pressure-induced deep tissue damage of left heel: Secondary | ICD-10-CM | POA: Diagnosis not present

## 2019-09-09 DIAGNOSIS — F0390 Unspecified dementia without behavioral disturbance: Secondary | ICD-10-CM | POA: Diagnosis not present

## 2019-09-09 DIAGNOSIS — Z720 Tobacco use: Secondary | ICD-10-CM | POA: Diagnosis not present

## 2019-09-09 DIAGNOSIS — L89616 Pressure-induced deep tissue damage of right heel: Secondary | ICD-10-CM | POA: Diagnosis not present

## 2019-09-09 DIAGNOSIS — I110 Hypertensive heart disease with heart failure: Secondary | ICD-10-CM | POA: Diagnosis not present

## 2019-09-09 DIAGNOSIS — K922 Gastrointestinal hemorrhage, unspecified: Secondary | ICD-10-CM | POA: Diagnosis not present

## 2019-09-09 DIAGNOSIS — D62 Acute posthemorrhagic anemia: Secondary | ICD-10-CM | POA: Diagnosis not present

## 2019-09-09 DIAGNOSIS — K219 Gastro-esophageal reflux disease without esophagitis: Secondary | ICD-10-CM | POA: Diagnosis not present

## 2019-09-09 DIAGNOSIS — I5030 Unspecified diastolic (congestive) heart failure: Secondary | ICD-10-CM | POA: Diagnosis not present

## 2019-09-09 DIAGNOSIS — Z853 Personal history of malignant neoplasm of breast: Secondary | ICD-10-CM | POA: Diagnosis not present

## 2019-09-09 DIAGNOSIS — E46 Unspecified protein-calorie malnutrition: Secondary | ICD-10-CM | POA: Diagnosis not present

## 2019-09-09 DIAGNOSIS — J45909 Unspecified asthma, uncomplicated: Secondary | ICD-10-CM | POA: Diagnosis not present

## 2019-09-09 DIAGNOSIS — G4700 Insomnia, unspecified: Secondary | ICD-10-CM | POA: Diagnosis not present

## 2019-09-09 DIAGNOSIS — E785 Hyperlipidemia, unspecified: Secondary | ICD-10-CM | POA: Diagnosis not present

## 2019-09-09 DIAGNOSIS — Z9181 History of falling: Secondary | ICD-10-CM | POA: Diagnosis not present

## 2019-09-09 DIAGNOSIS — E119 Type 2 diabetes mellitus without complications: Secondary | ICD-10-CM | POA: Diagnosis not present

## 2019-09-10 ENCOUNTER — Other Ambulatory Visit: Payer: Self-pay | Admitting: *Deleted

## 2019-09-10 ENCOUNTER — Telehealth: Payer: Self-pay

## 2019-09-10 NOTE — Patient Outreach (Signed)
[facility]) Care Management  09/10/2019  [name] [birth year] [id number]   Notified by [facility] coordinator that member discharged from SNF on Friday 10/23.  She was admitted to hospital 9/27-9/30 with hypokalemia and weakness, discharged to SNF for rehab.  She was readmitted back to hospital on 10/8 after a fall at the facility, resulting in head injury, GI bleed was also noted at that time.  She was discharged back to facility on 10/11.  Per chart, history includes but not limited to CHF, HTN, TIA, DM, and dementia.  Call placed to [facility] caregiver, [name].  She report member has slowly been progressing since being home.  She expresses frustration regarding member's condition upon discharge.  State she was told member was recovering well with PT and using walker to ambulate short distances.  When she arrived, report member was not able to sit up in bed and was dependent for all ADL's and a complete lift from wheelchair to car.  Report home health has restarted and she is active again with PT and OT.  Also state member is now eating more and able to feed herself (wasn't able to do this at discharge).  Daughter is able to give member medications crushed with applesauce.    Member does not have follow up appointment with MD yet, daughter will call to schedule. She is concerned that she will not be able to get member out of the home as she is no longer able to go down the stairs of their home.  Advised to inquire about virtual and/or televisit.  Will also contact Education officer, museum for resources for ramp.  Along with home health she continue to have in home aide assistance from 9 am-3 pm.  She report other family members are now stepping up to help with member's care.    Denies any urgent concerns at this time, advised to contact with questions.  Will follow up within the next week.  Fall Risk  09/11/2019 04/09/2019 10/22/2018 08/20/2018 07/23/2018  Falls in the past year? 1 1 0 No No   Number falls in past yr: 1 0 - - -  Injury with Fall? 1 1 - - -  Comment - mobilized knee on may 1st - - -  Risk Factor Category  - - - - -  Risk for fall due to : History of fall(s);Impaired balance/gait - - - -  Follow up Falls prevention discussed Follow up appointment - - -  Comment - md informed - - -   Depression screen [facility] 2/9 04/09/2019 10/22/2018 07/23/2018 06/20/2018 04/26/2018  Decreased Interest 0 0 0 0 0  Down, Depressed, Hopeless 0 - 0 0 0  PHQ - 2 Score 0 0 0 0 0  Altered sleeping - - - - -  Tired, decreased energy - - - - -  Change in appetite - - - - -  Feeling bad or failure about yourself  - - - - -  Trouble concentrating - - - - -  Moving slowly or fidgety/restless - - - - -  Suicidal thoughts - - - - -  PHQ-9 Score - - - - -  Some recent data might be hidden   THN CM Care Plan Problem One     Most Recent Value  Care Plan Problem One  Risk for readmission related to member's physical decline as evidenced by recent hospitalization requiring SNF stay  Role Documenting the Problem One  Care Management Coordinator  Care Plan  for Problem One  Active  THN Long Term Goal   Member will not be readmitted to hospital within the next 31 days  THN Long Term Goal Start Date  09/10/19  Interventions for Problem One Long Term Goal  Discharge instructions reviewed with daughter.  Advised of importance of following plan of care (home health involvment, etc) in effort to decrease risk of readmission  THN CM Short Term Goal #1   Member will have follow up appointment with primary MD within the next 2 weeks  THN CM Short Term Goal #1 Start Date  09/10/19  Interventions for Short Term Goal #1  Daughter notified of no appointment scheduled, advised to call office to schedule virtual/televisit as soon as possible  THN CM Short Term Goal #2   Daughter will report increase in member's strength within the next 2 weeks  THN CM Short Term Goal #2 Start Date  09/10/19  Interventions for Short  Term Goal #2  Confirmed with daughter that home health has restarted, educated on importance of involvement and close follow up as well as working with member independently     [name], Therapist, sports, MSN [place] [phone]

## 2019-09-10 NOTE — Telephone Encounter (Signed)
Cecile Hearing, home health PT, LVM on nurse line requesting verbal orders for the following...  PT, OT, skilled nursing and speech.   Verbal ok can be left on her voice mail [phone].

## 2019-09-10 NOTE — Patient Outreach (Signed)
Member assessed for potential [facility] Care Management needs as a benefit of  [place] Medicare.  Verified in Patient [place] and in [employer] that Mrs. Golub discharged home on 09/05/19.  Notification sent to [place] Management RNCM to make aware of member's discharge.   Marthenia Rolling, MSN-Ed, RN,BSN [place] Acute Care Coordinator [phone] [facility]) [phone]  (Toll free office)

## 2019-09-11 ENCOUNTER — Encounter: Payer: Self-pay | Admitting: *Deleted

## 2019-09-11 NOTE — Telephone Encounter (Signed)
Verbal orders left on voicemail as instructed

## 2019-09-12 ENCOUNTER — Other Ambulatory Visit: Payer: Self-pay

## 2019-09-12 DIAGNOSIS — L89526 Pressure-induced deep tissue damage of left ankle: Secondary | ICD-10-CM | POA: Diagnosis not present

## 2019-09-12 DIAGNOSIS — Z6820 Body mass index (BMI) 20.0-20.9, adult: Secondary | ICD-10-CM | POA: Diagnosis not present

## 2019-09-12 DIAGNOSIS — R32 Unspecified urinary incontinence: Secondary | ICD-10-CM | POA: Diagnosis not present

## 2019-09-12 DIAGNOSIS — I110 Hypertensive heart disease with heart failure: Secondary | ICD-10-CM | POA: Diagnosis not present

## 2019-09-12 DIAGNOSIS — E43 Unspecified severe protein-calorie malnutrition: Secondary | ICD-10-CM | POA: Diagnosis not present

## 2019-09-12 DIAGNOSIS — K922 Gastrointestinal hemorrhage, unspecified: Secondary | ICD-10-CM | POA: Diagnosis not present

## 2019-09-12 DIAGNOSIS — R569 Unspecified convulsions: Secondary | ICD-10-CM | POA: Diagnosis not present

## 2019-09-12 DIAGNOSIS — G309 Alzheimer's disease, unspecified: Secondary | ICD-10-CM | POA: Diagnosis not present

## 2019-09-12 DIAGNOSIS — L89516 Pressure-induced deep tissue damage of right ankle: Secondary | ICD-10-CM | POA: Diagnosis not present

## 2019-09-12 DIAGNOSIS — Z741 Need for assistance with personal care: Secondary | ICD-10-CM | POA: Diagnosis not present

## 2019-09-12 DIAGNOSIS — K219 Gastro-esophageal reflux disease without esophagitis: Secondary | ICD-10-CM | POA: Diagnosis not present

## 2019-09-12 DIAGNOSIS — F17220 Nicotine dependence, chewing tobacco, uncomplicated: Secondary | ICD-10-CM | POA: Diagnosis not present

## 2019-09-12 DIAGNOSIS — L89152 Pressure ulcer of sacral region, stage 2: Secondary | ICD-10-CM | POA: Diagnosis not present

## 2019-09-12 DIAGNOSIS — J302 Other seasonal allergic rhinitis: Secondary | ICD-10-CM | POA: Diagnosis not present

## 2019-09-12 DIAGNOSIS — I2510 Atherosclerotic heart disease of native coronary artery without angina pectoris: Secondary | ICD-10-CM | POA: Diagnosis not present

## 2019-09-12 DIAGNOSIS — F0280 Dementia in other diseases classified elsewhere without behavioral disturbance: Secondary | ICD-10-CM | POA: Diagnosis not present

## 2019-09-12 DIAGNOSIS — I509 Heart failure, unspecified: Secondary | ICD-10-CM | POA: Diagnosis not present

## 2019-09-12 NOTE — Patient Outreach (Signed)
[facility] [facility] Care Management  09/12/2019  [name] [birth year] [id number]   Referral received from [facility], [name], to outreach patient's daughter regarding ramp resources.  Patient was referred back in August 2019 for same reason.   Per daughter, she completed paperwork for Independent Living program and mailed it back in but never got a response.  Informed daughter that there has been a change in staffing which could have possibly impacted referral.  Will contact Estanislado Pandy, Secretary/administrator, regarding status of referral. Daughter was provided with contact information for [place] when patient was last referred but she cannot recall if she contacted them.  Offered to conduct three way call today to submit referral but she stated that she would call; provided her with contact information again. Will follow up with daughter when response is received about referral to Independent Living.  Ronn Melena, BSW Social Worker [phone]

## 2019-09-14 DIAGNOSIS — Z6820 Body mass index (BMI) 20.0-20.9, adult: Secondary | ICD-10-CM | POA: Diagnosis not present

## 2019-09-14 DIAGNOSIS — L89152 Pressure ulcer of sacral region, stage 2: Secondary | ICD-10-CM | POA: Diagnosis not present

## 2019-09-14 DIAGNOSIS — K922 Gastrointestinal hemorrhage, unspecified: Secondary | ICD-10-CM | POA: Diagnosis not present

## 2019-09-14 DIAGNOSIS — J302 Other seasonal allergic rhinitis: Secondary | ICD-10-CM | POA: Diagnosis not present

## 2019-09-14 DIAGNOSIS — I509 Heart failure, unspecified: Secondary | ICD-10-CM | POA: Diagnosis not present

## 2019-09-14 DIAGNOSIS — R569 Unspecified convulsions: Secondary | ICD-10-CM | POA: Diagnosis not present

## 2019-09-14 DIAGNOSIS — K219 Gastro-esophageal reflux disease without esophagitis: Secondary | ICD-10-CM | POA: Diagnosis not present

## 2019-09-14 DIAGNOSIS — L89516 Pressure-induced deep tissue damage of right ankle: Secondary | ICD-10-CM | POA: Diagnosis not present

## 2019-09-14 DIAGNOSIS — F0280 Dementia in other diseases classified elsewhere without behavioral disturbance: Secondary | ICD-10-CM | POA: Diagnosis not present

## 2019-09-14 DIAGNOSIS — L89526 Pressure-induced deep tissue damage of left ankle: Secondary | ICD-10-CM | POA: Diagnosis not present

## 2019-09-14 DIAGNOSIS — E43 Unspecified severe protein-calorie malnutrition: Secondary | ICD-10-CM | POA: Diagnosis not present

## 2019-09-14 DIAGNOSIS — F17220 Nicotine dependence, chewing tobacco, uncomplicated: Secondary | ICD-10-CM | POA: Diagnosis not present

## 2019-09-14 DIAGNOSIS — I110 Hypertensive heart disease with heart failure: Secondary | ICD-10-CM | POA: Diagnosis not present

## 2019-09-14 DIAGNOSIS — G309 Alzheimer's disease, unspecified: Secondary | ICD-10-CM | POA: Diagnosis not present

## 2019-09-14 DIAGNOSIS — R32 Unspecified urinary incontinence: Secondary | ICD-10-CM | POA: Diagnosis not present

## 2019-09-14 DIAGNOSIS — Z741 Need for assistance with personal care: Secondary | ICD-10-CM | POA: Diagnosis not present

## 2019-09-14 DIAGNOSIS — I2510 Atherosclerotic heart disease of native coronary artery without angina pectoris: Secondary | ICD-10-CM | POA: Diagnosis not present

## 2019-09-15 ENCOUNTER — Telehealth: Payer: Self-pay

## 2019-09-15 ENCOUNTER — Other Ambulatory Visit: Payer: Self-pay | Admitting: Family Medicine

## 2019-09-15 DIAGNOSIS — I2510 Atherosclerotic heart disease of native coronary artery without angina pectoris: Secondary | ICD-10-CM | POA: Diagnosis not present

## 2019-09-15 DIAGNOSIS — I509 Heart failure, unspecified: Secondary | ICD-10-CM | POA: Diagnosis not present

## 2019-09-15 DIAGNOSIS — F0280 Dementia in other diseases classified elsewhere without behavioral disturbance: Secondary | ICD-10-CM | POA: Diagnosis not present

## 2019-09-15 DIAGNOSIS — I110 Hypertensive heart disease with heart failure: Secondary | ICD-10-CM | POA: Diagnosis not present

## 2019-09-15 DIAGNOSIS — G309 Alzheimer's disease, unspecified: Secondary | ICD-10-CM | POA: Diagnosis not present

## 2019-09-15 DIAGNOSIS — E43 Unspecified severe protein-calorie malnutrition: Secondary | ICD-10-CM | POA: Diagnosis not present

## 2019-09-15 NOTE — Telephone Encounter (Signed)
Called and LVM for patient;s daughter [name] to make a telemed visit per Dr. Tarry Kos.  Ozella Almond, [place]

## 2019-09-17 ENCOUNTER — Other Ambulatory Visit: Payer: Self-pay | Admitting: *Deleted

## 2019-09-17 NOTE — Patient Outreach (Signed)
[place] [facility]) Care Management  09/17/2019  NASYA KOSS [birth year] [id number]   Weekly transition of care call placed to [facility] caregiver/daughter [place], no answer.  HIPAA compliant voice message left.  Will follow up within the next 3-4 business days.  [name], [place], MSN [place] [phone]

## 2019-09-18 ENCOUNTER — Ambulatory Visit: Payer: Medicare Other | Admitting: *Deleted

## 2019-09-19 ENCOUNTER — Other Ambulatory Visit: Payer: Self-pay | Admitting: *Deleted

## 2019-09-19 NOTE — Patient Outreach (Signed)
[facility] [facility]) Care Management  09/19/2019  SULLY MUTCHLER 01-02-1924 [id number]   Call received back from [facility] daughter after missed call yesterday.  State member is improving slowly, still needing a lot of assistance with transitioning but now able to feed self and turn herself in bed.  Does not have hospital bed, advised daughter of frequent turns to decrease risk of pressure sores.  She has not had appointment scheduled yet, per chart message was left for daughter to call back to schedule.  She report she called back to the office but had to leave a message, will call again today.  Member has not had flu shot this season, advised to inquire if home health nurse can administer.  She verbalizes understanding.  Denies any urgent concerns, will follow up within the next week.  THN CM Care Plan Problem One     Most Recent Value  Care Plan Problem One  Risk for readmission related to member's physical decline as evidenced by recent hospitalization requiring SNF stay  Role Documenting the Problem One  Care Management [place] for Problem One  Active  [facility] Long Term Goal   Member will not be readmitted to hospital within the next 31 days  THN Long Term Goal Start Date  09/10/19  Interventions for Problem One Long Term Goal  Daughter educated on interventions to decrease risk of readmission (frequent turns to decrease pressure sores, flu shot, etc)  THN CM Short Term Goal #1   Member will have follow up appointment with primary MD within the next 2 weeks  THN CM Short Term Goal #1 Start Date  09/10/19  Interventions for Short Term Goal #1  Advised daughter to contact office again due to missed calls to schedule visit  THN CM Short Term Goal #2   Daughter will report increase in member's strength within the next 2 weeks  THN CM Short Term Goal #2 Start Date  09/10/19  Interventions for Short Term Goal #2  Daughter educated on importance of PT and how involvement  increases strength     [name], Therapist, sports, MSN [place] Manager [phone]

## 2019-09-23 ENCOUNTER — Ambulatory Visit: Payer: Self-pay | Admitting: *Deleted

## 2019-09-23 DIAGNOSIS — I2510 Atherosclerotic heart disease of native coronary artery without angina pectoris: Secondary | ICD-10-CM | POA: Diagnosis not present

## 2019-09-23 DIAGNOSIS — I110 Hypertensive heart disease with heart failure: Secondary | ICD-10-CM | POA: Diagnosis not present

## 2019-09-23 DIAGNOSIS — I509 Heart failure, unspecified: Secondary | ICD-10-CM | POA: Diagnosis not present

## 2019-09-23 DIAGNOSIS — G309 Alzheimer's disease, unspecified: Secondary | ICD-10-CM | POA: Diagnosis not present

## 2019-09-23 DIAGNOSIS — E43 Unspecified severe protein-calorie malnutrition: Secondary | ICD-10-CM | POA: Diagnosis not present

## 2019-09-23 DIAGNOSIS — F0280 Dementia in other diseases classified elsewhere without behavioral disturbance: Secondary | ICD-10-CM | POA: Diagnosis not present

## 2019-09-24 DIAGNOSIS — G309 Alzheimer's disease, unspecified: Secondary | ICD-10-CM | POA: Diagnosis not present

## 2019-09-24 DIAGNOSIS — I509 Heart failure, unspecified: Secondary | ICD-10-CM | POA: Diagnosis not present

## 2019-09-24 DIAGNOSIS — I2510 Atherosclerotic heart disease of native coronary artery without angina pectoris: Secondary | ICD-10-CM | POA: Diagnosis not present

## 2019-09-24 DIAGNOSIS — F0280 Dementia in other diseases classified elsewhere without behavioral disturbance: Secondary | ICD-10-CM | POA: Diagnosis not present

## 2019-09-24 DIAGNOSIS — I110 Hypertensive heart disease with heart failure: Secondary | ICD-10-CM | POA: Diagnosis not present

## 2019-09-24 DIAGNOSIS — E43 Unspecified severe protein-calorie malnutrition: Secondary | ICD-10-CM | POA: Diagnosis not present

## 2019-09-26 ENCOUNTER — Other Ambulatory Visit: Payer: Self-pay | Admitting: *Deleted

## 2019-09-26 ENCOUNTER — Other Ambulatory Visit: Payer: Self-pay | Admitting: Family Medicine

## 2019-09-26 DIAGNOSIS — F0150 Vascular dementia without behavioral disturbance: Secondary | ICD-10-CM

## 2019-09-26 DIAGNOSIS — F0280 Dementia in other diseases classified elsewhere without behavioral disturbance: Secondary | ICD-10-CM

## 2019-09-26 NOTE — Patient Outreach (Signed)
[place] [facility]) Care Management  09/26/2019  KRISTA GODSIL [birth year] [id number]   Weekly transition of care call placed to [facility] daughter [name], no answer.  HPAA compliant voice message left, will await call back.  If no call back, will follow up within the next 3-4 business days.    Update:  Call received back from daughter.  She report member is no longer progressing, state she feel member has taken steps backward.  Appetite remains decreased, weight down to 64 pounds according to visiting NP with [facility].  Discussed meal assistance, will have social worker place on wait list for MOW.  PT is no longer working with member due to her inability to participate in therapy.  Daughter report member is starting to try to get out of bed alone, increasing risk of fall.  She has 24 hour supervision, daughter denies any urgent concerns.  Verbalizes understanding of seeking medical attention if member's condition gets worse.    Will follow up within the next week, if member not improved, will discuss palliative care.  THN CM Care Plan Problem One     Most Recent Value  Care Plan Problem One  Risk for readmission related to member's physical decline as evidenced by recent hospitalization requiring SNF stay  Role Documenting the Problem One  Care Management [place] for Problem One  Active  [facility] Long Term Goal   Member will not be readmitted to hospital within the next 31 days  THN Long Term Goal Start Date  09/10/19  Interventions for Problem One Long Term Goal  Referral for meal assistance discussed with daughter.  Advised of importance of nutrition in relation to recovery  THN CM Short Term Goal #1   Member will have follow up appointment with primary MD within the next 2 weeks  THN CM Short Term Goal #1 Start Date  09/10/19  Interventions for Short Term Goal #1  Visit complete with NP from [facility] but also advised to schedule virtual visit with PCP  [facility]  CM Short Term Goal #2   Daughter will report increase in member's strength within the next 2 weeks  THN CM Short Term Goal #2 Start Date  09/10/19  [facility] CM Short Term Goal #2 Met Date  -- [Not met]       [name], RN, MSN [place] Manager [phone]

## 2019-09-29 DIAGNOSIS — I110 Hypertensive heart disease with heart failure: Secondary | ICD-10-CM | POA: Diagnosis not present

## 2019-09-29 DIAGNOSIS — G309 Alzheimer's disease, unspecified: Secondary | ICD-10-CM | POA: Diagnosis not present

## 2019-09-29 DIAGNOSIS — I2510 Atherosclerotic heart disease of native coronary artery without angina pectoris: Secondary | ICD-10-CM | POA: Diagnosis not present

## 2019-09-29 DIAGNOSIS — F0280 Dementia in other diseases classified elsewhere without behavioral disturbance: Secondary | ICD-10-CM | POA: Diagnosis not present

## 2019-09-29 DIAGNOSIS — E43 Unspecified severe protein-calorie malnutrition: Secondary | ICD-10-CM | POA: Diagnosis not present

## 2019-09-29 DIAGNOSIS — I509 Heart failure, unspecified: Secondary | ICD-10-CM | POA: Diagnosis not present

## 2019-09-30 ENCOUNTER — Telehealth: Payer: Self-pay | Admitting: *Deleted

## 2019-09-30 ENCOUNTER — Other Ambulatory Visit: Payer: Self-pay

## 2019-09-30 ENCOUNTER — Ambulatory Visit: Payer: Self-pay

## 2019-09-30 NOTE — Patient Outreach (Signed)
[place] [facility]) Care Management  09/30/2019  TEAH ARMENT 07/02/1924 [id number]   Received return call from patient's daughter, [name]. She provided information requested by Independent Living program to continue processing referral for ramp assistance.  Called Ms. Horner with [place] back and provided her with this information.  Also informed her that patient is now seeking assistance with repairs to ceiling in various areas of the home. Daughter reports that she has spoken with Judeth Cornfield with [employer] several times regarding assistance with ramp and ceiling repairs.  She could not remember the last time she spoke with Kermit Balo but stated that it was quite some time ago.  Per patient, she was told not to contact them; they would contact her.  Sent message to [place] today requesting update.  Will follow up with daughter when response is received from [place] and [facility]. Talked with daughter about request for Meals on Wheels.  Discussed eligibility criteria and assessment process.  Informed her that wait list is approximately, 6-8 months.  Daughter declined referral being submitted as she is able to prepare meals for patient.    Ronn Melena, BSW Social Worker [phone]

## 2019-09-30 NOTE — Patient Outreach (Addendum)
[place] [facility]) Care Management  09/30/2019  DANYEAL GAMEL 1923/12/12 [id number]    Spoke with patient's daughter on 09/12/19 about status of referral to Independent Living program which patient was referred to back in August 2019.  Daughter reports that she submitted paperwork and never heard back regarding eligibility.  Sent messages to [employer] with Independent Living on 09/12/19 and today to inquire about referral.  Also left voicemail message today due to no response to initial message sent on 10/30.   The following in-basket message was received from [facility], [name], on 09/26/19.   'Received a referral from [facility] requesting MOW for Ms. [place]. I was able to speak with her daughter Angle today and made her aware that there was a 6-8 month wait list. Maybe she can receive Mom's meals for a period of time. [name] is aware that Mom's meals is only for 30 days.  Also, [name] is requesting resources to help fix their roof. She said there was a leak in the bathroom and kitchen, and the bathroom ceiling is starting to collapse. She wasn't sure if she should reach back out to the resource you provided for the ramp because she was told not to call them back. I told her I would have you follow up with her."  Attempted to contact daughter today but had to leave voicemail message.  Will attempt to reach her again within four business days.   Addendum:  Received voicemail message from Ms. Horner with Independent Living. She requested a call back to provide patient name, date of birth, and address so that she may check status of original referral.  Called back and left this information in confidential voicemail box.  Addendum:  Received return call from Ms. Horner.  Original referral was closed in April 2020 due to inability to contact patient for additional information that was needed.  Will gather this information when/if able to get in touch with daughter or  patient.    Ronn Melena, BSW Social Worker [phone]

## 2019-09-30 NOTE — Telephone Encounter (Signed)
Called pt and daughter answered phone to see about scheduling pt flu vaccine and she stated that Hospice had given it to her last month.  Requested her check with them to get the date so we could update pt chart.[name], CMA

## 2019-10-01 ENCOUNTER — Ambulatory Visit: Payer: Self-pay

## 2019-10-01 IMAGING — CR DG ABDOMEN ACUTE W/ 1V CHEST
3 series · 3 of 3 positions shown · non-contrast
Comparison: None.

CLINICAL DATA: Nausea and vomiting

EXAM:
DG ABDOMEN ACUTE W/ 1V CHEST

[abdomen erect]
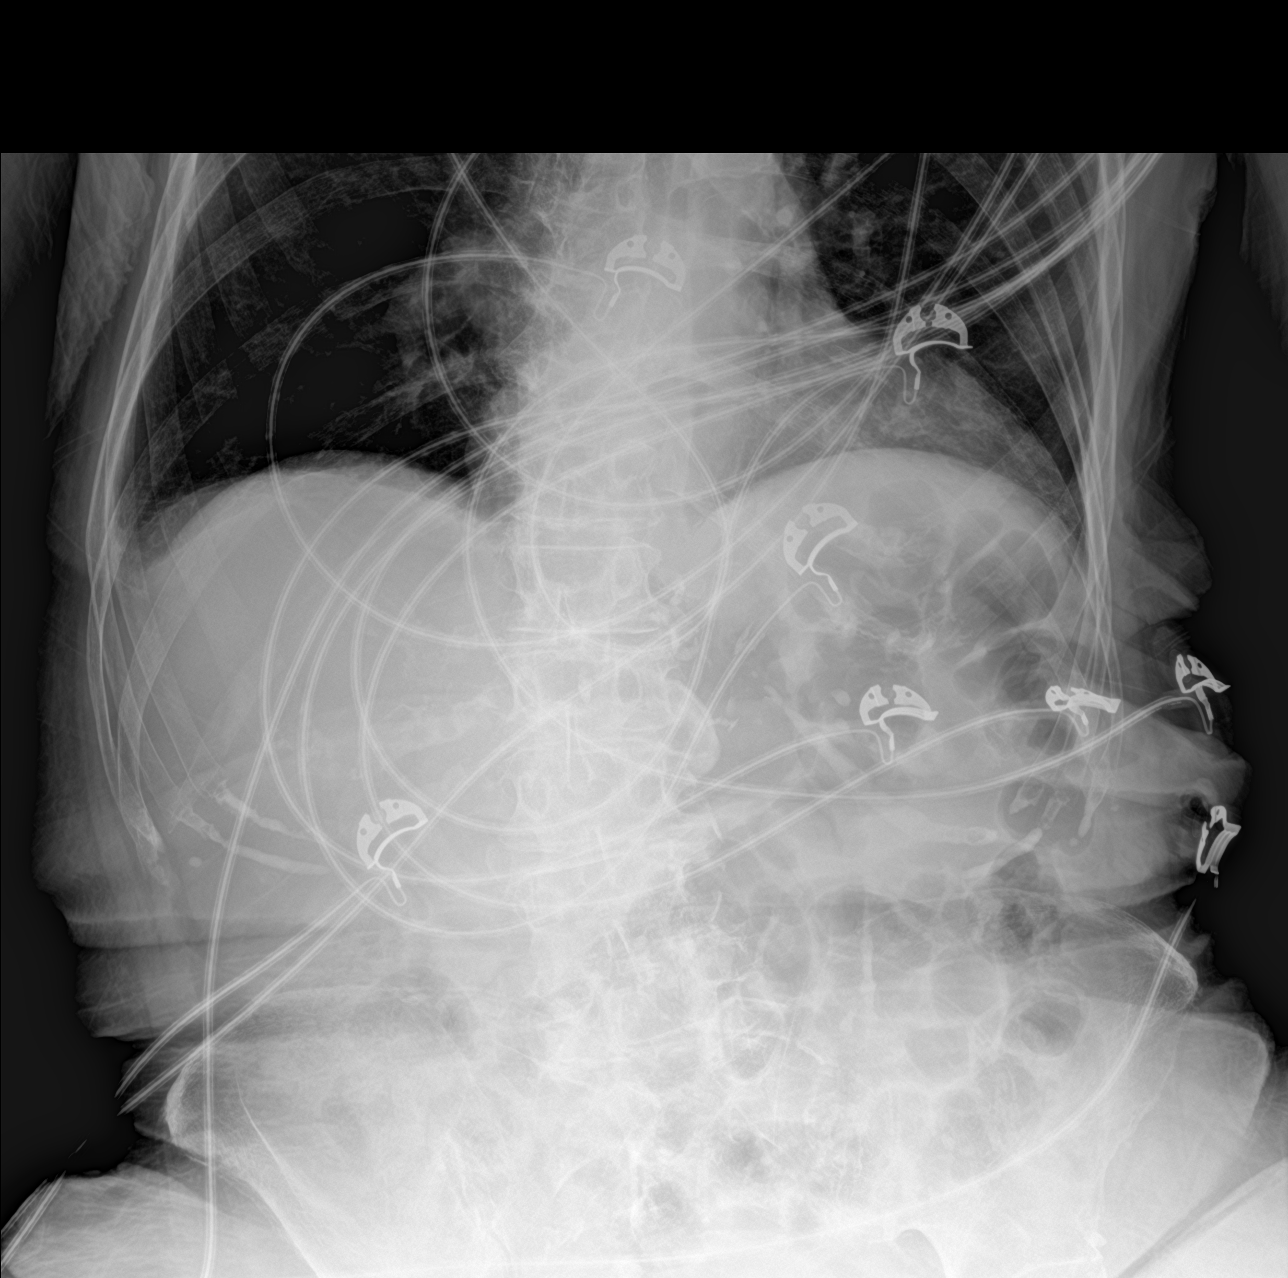

[abdomen supine]
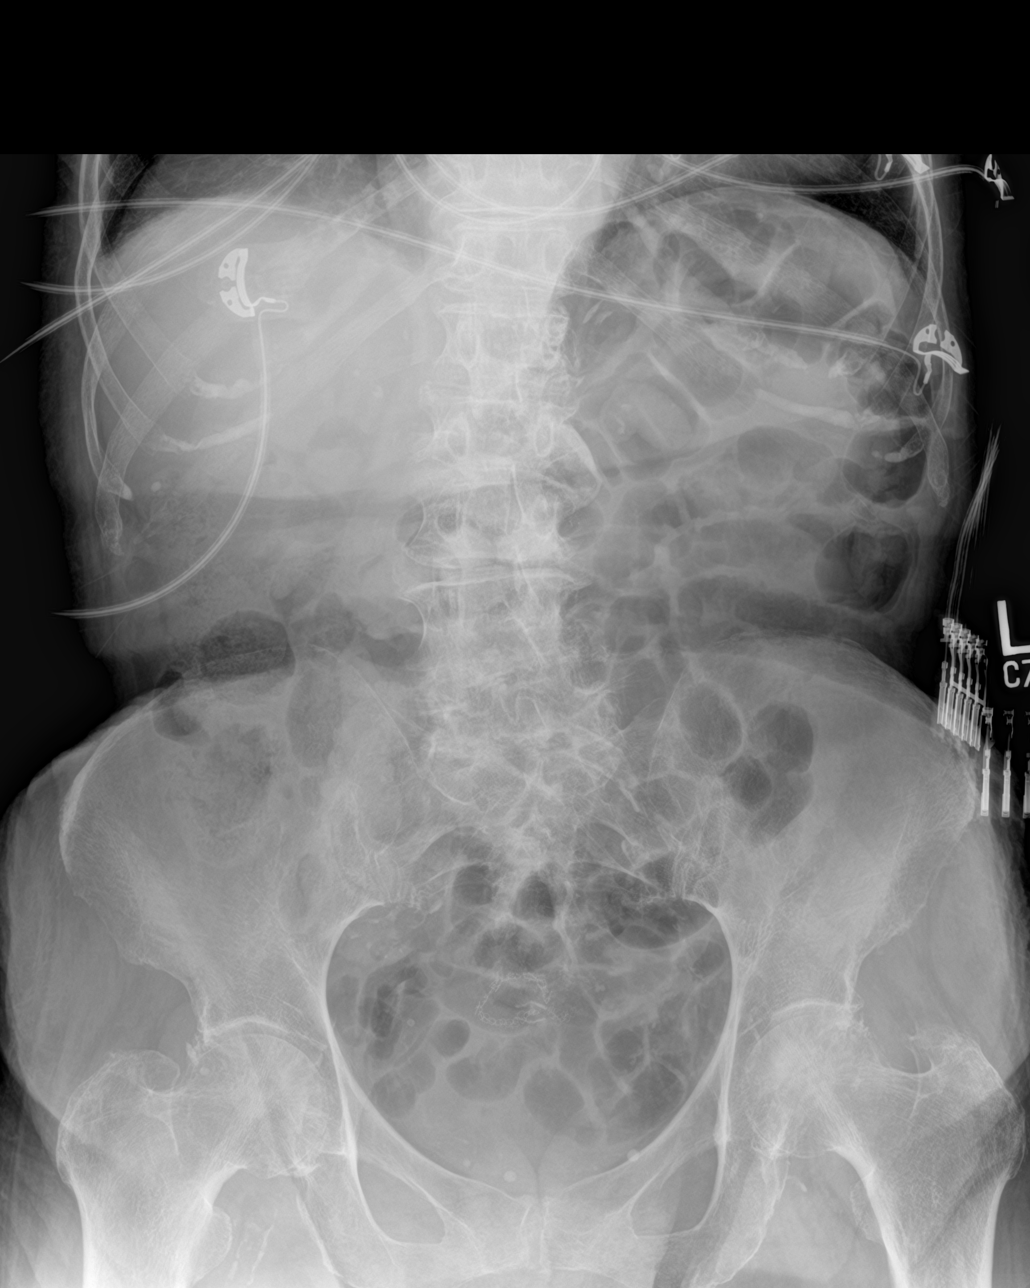

[chest ap]
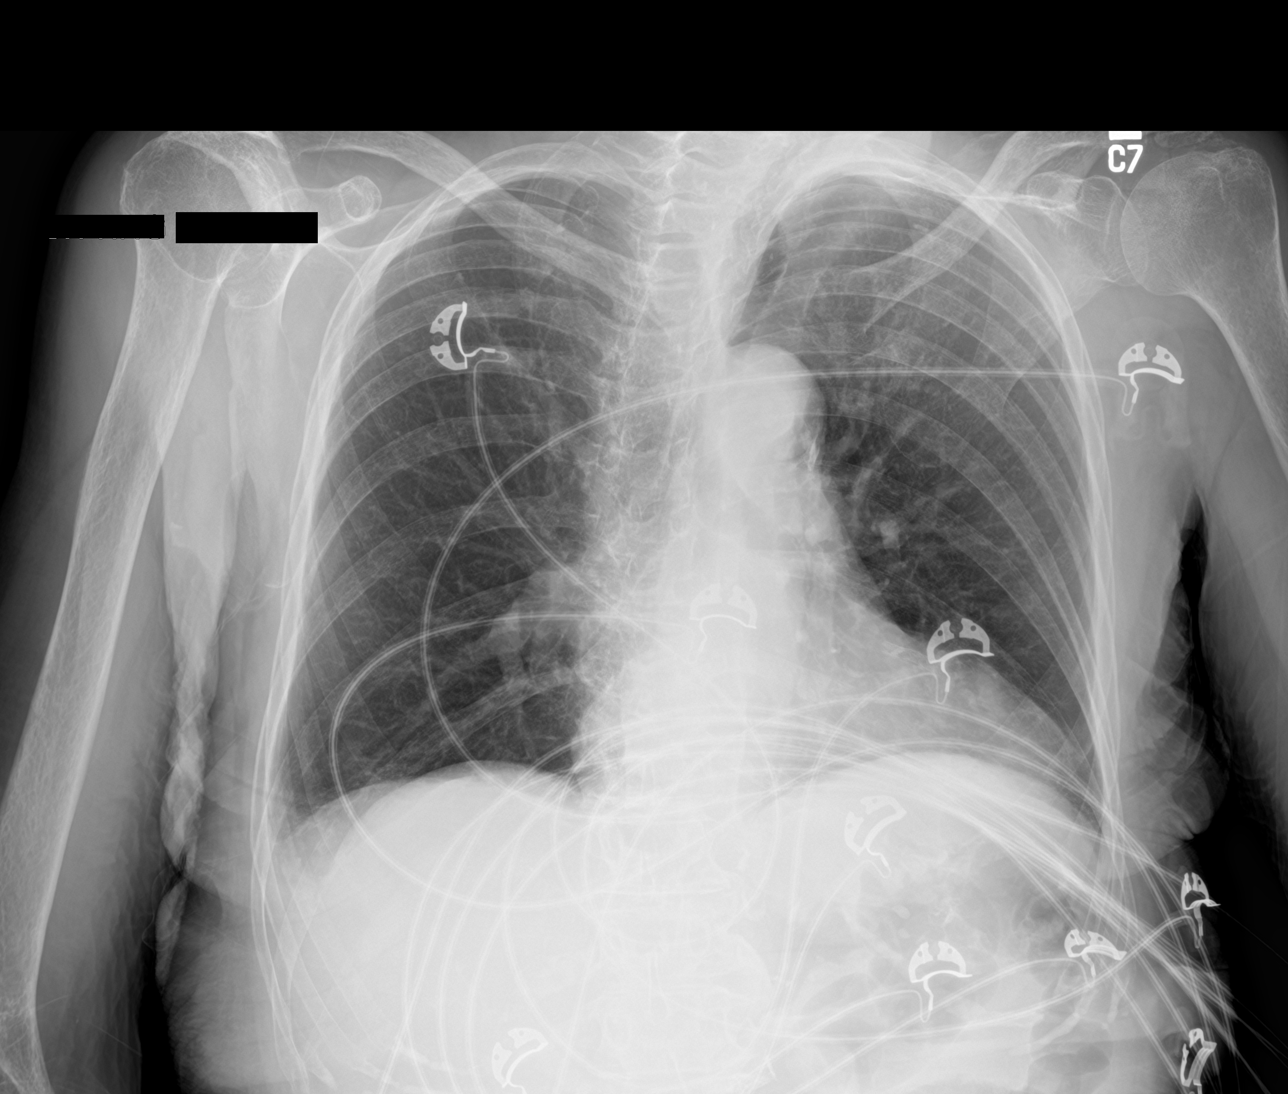

[3 of 3 positions shown; findings below may reference images not displayed]

FINDINGS: Normal mediastinum and cardiac silhouette. Normal pulmonary
vasculature. No evidence of effusion, infiltrate, or pneumothorax.
No acute bony abnormality.

No dilated large or small bowel. No intraperitoneal free air. Gas in
the rectum. Surgical clips in the lower pelvis.

Degenerate changes of the spine.  Osteoarthritis of the hips.
IMPRESSION: 1.  No acute cardiopulmonary process.
2. No bowel obstruction.

## 2019-10-02 ENCOUNTER — Other Ambulatory Visit: Payer: Self-pay | Admitting: *Deleted

## 2019-10-02 NOTE — Patient Outreach (Signed)
[facility]) Care Management  10/02/2019  [name] 1924-07-21 [id number]   Weekly transition of care call placed to [facility] caregiver/daughter [place].  State member's condition has remained "up and down," not really showing much improvement.  Appetite remains decreased but able to drink ensure.  Daughter was able to speak with agency to help with home repairs, waiting on more information.  This care manager inquired about potential involvement with palliative care, daughter report she is already involved with hospice through [facility].  Inquired about follow up from remote health visit, state they will be visiting again within the next week.  Continue to have support of her family and member's home care aide.  Denies any urgent concerns at this time.  This care manager will follow up with [facility] and with [facility].  If member active with hospice will close case at that time.  [facility] CM Care Plan Problem One     Most Recent Value  Care Plan Problem One  Risk for readmission related to member's physical decline as evidenced by recent hospitalization requiring SNF stay  Role Documenting the Problem One  Care Management [place] for Problem One  Active  [facility] Long Term Goal   Member will not be readmitted to hospital within the next 31 days  [facility] Long Term Goal Start Date  09/10/19  [facility] for Problem One Long Term Goal  Collaboration with remote health in effort to establish plan of care to decrease readmission rate  [facility] CM Short Term Goal #1   Member will have follow up appointment with primary MD within the next 2 weeks  [facility] CM Short Term Goal #1 Start Date  09/10/19  [facility] CM Short Term Goal #1 Met Date  10/02/19     [name], RN, MSN [place] [phone]

## 2019-10-08 ENCOUNTER — Other Ambulatory Visit: Payer: Self-pay | Admitting: *Deleted

## 2019-10-08 NOTE — Patient Outreach (Signed)
[facility] [facility]) Care Management  10/08/2019  TAYTUM SCHECK 1924-07-24 [id number]   Weekly transition of care call placed to [facility] caregiver/daughter.  She report member is doing about the same, no significant improvement.  Call was also placed to [facility] to confirm involvement, notified that member is active with hospice as of 10/30.  Daughter aware that case with [facility] will be closed due to management of care by hospice team.  Will notify [facility] of case closure.  [facility] CM Care Plan Problem One     Most Recent Value  Care Plan Problem One  Risk for readmission related to member's physical decline as evidenced by recent hospitalization requiring SNF stay  Role Documenting the Problem One  Care Management [place] for Problem One  Not Active  [facility] Long Term Goal   Member will not be readmitted to hospital within the next 31 days  [facility] Long Term Goal Start Date  09/10/19  [facility] Long Term Goal Met Date  10/08/19  [facility] CM Short Term Goal #1   Member will have follow up appointment with primary MD within the next 2 weeks  [facility] CM Short Term Goal #1 Start Date  09/10/19  [facility] CM Short Term Goal #1 Met Date  10/02/19       [name], RN, MSN [place] [phone] \

## 2019-10-09 DIAGNOSIS — I509 Heart failure, unspecified: Secondary | ICD-10-CM | POA: Diagnosis not present

## 2019-10-09 DIAGNOSIS — E43 Unspecified severe protein-calorie malnutrition: Secondary | ICD-10-CM | POA: Diagnosis not present

## 2019-10-09 DIAGNOSIS — G309 Alzheimer's disease, unspecified: Secondary | ICD-10-CM | POA: Diagnosis not present

## 2019-10-09 DIAGNOSIS — I110 Hypertensive heart disease with heart failure: Secondary | ICD-10-CM | POA: Diagnosis not present

## 2019-10-09 DIAGNOSIS — F0280 Dementia in other diseases classified elsewhere without behavioral disturbance: Secondary | ICD-10-CM | POA: Diagnosis not present

## 2019-10-09 DIAGNOSIS — I2510 Atherosclerotic heart disease of native coronary artery without angina pectoris: Secondary | ICD-10-CM | POA: Diagnosis not present

## 2019-10-10 DIAGNOSIS — I509 Heart failure, unspecified: Secondary | ICD-10-CM | POA: Diagnosis not present

## 2019-10-10 DIAGNOSIS — E43 Unspecified severe protein-calorie malnutrition: Secondary | ICD-10-CM | POA: Diagnosis not present

## 2019-10-10 DIAGNOSIS — I110 Hypertensive heart disease with heart failure: Secondary | ICD-10-CM | POA: Diagnosis not present

## 2019-10-10 DIAGNOSIS — I2510 Atherosclerotic heart disease of native coronary artery without angina pectoris: Secondary | ICD-10-CM | POA: Diagnosis not present

## 2019-10-10 DIAGNOSIS — F0280 Dementia in other diseases classified elsewhere without behavioral disturbance: Secondary | ICD-10-CM | POA: Diagnosis not present

## 2019-10-10 DIAGNOSIS — G309 Alzheimer's disease, unspecified: Secondary | ICD-10-CM | POA: Diagnosis not present

## 2019-10-12 IMAGING — DX DG RIBS W/ CHEST 3+V*L*
3 series · 3 of 3 positions shown · non-contrast
Comparison: Chest x-ray 05/02/2019

CLINICAL DATA: Fall today from chair at [HOSPITAL] landing on
left side.

EXAM:
LEFT RIBS AND CHEST - 3+ VIEW

[rib ap]
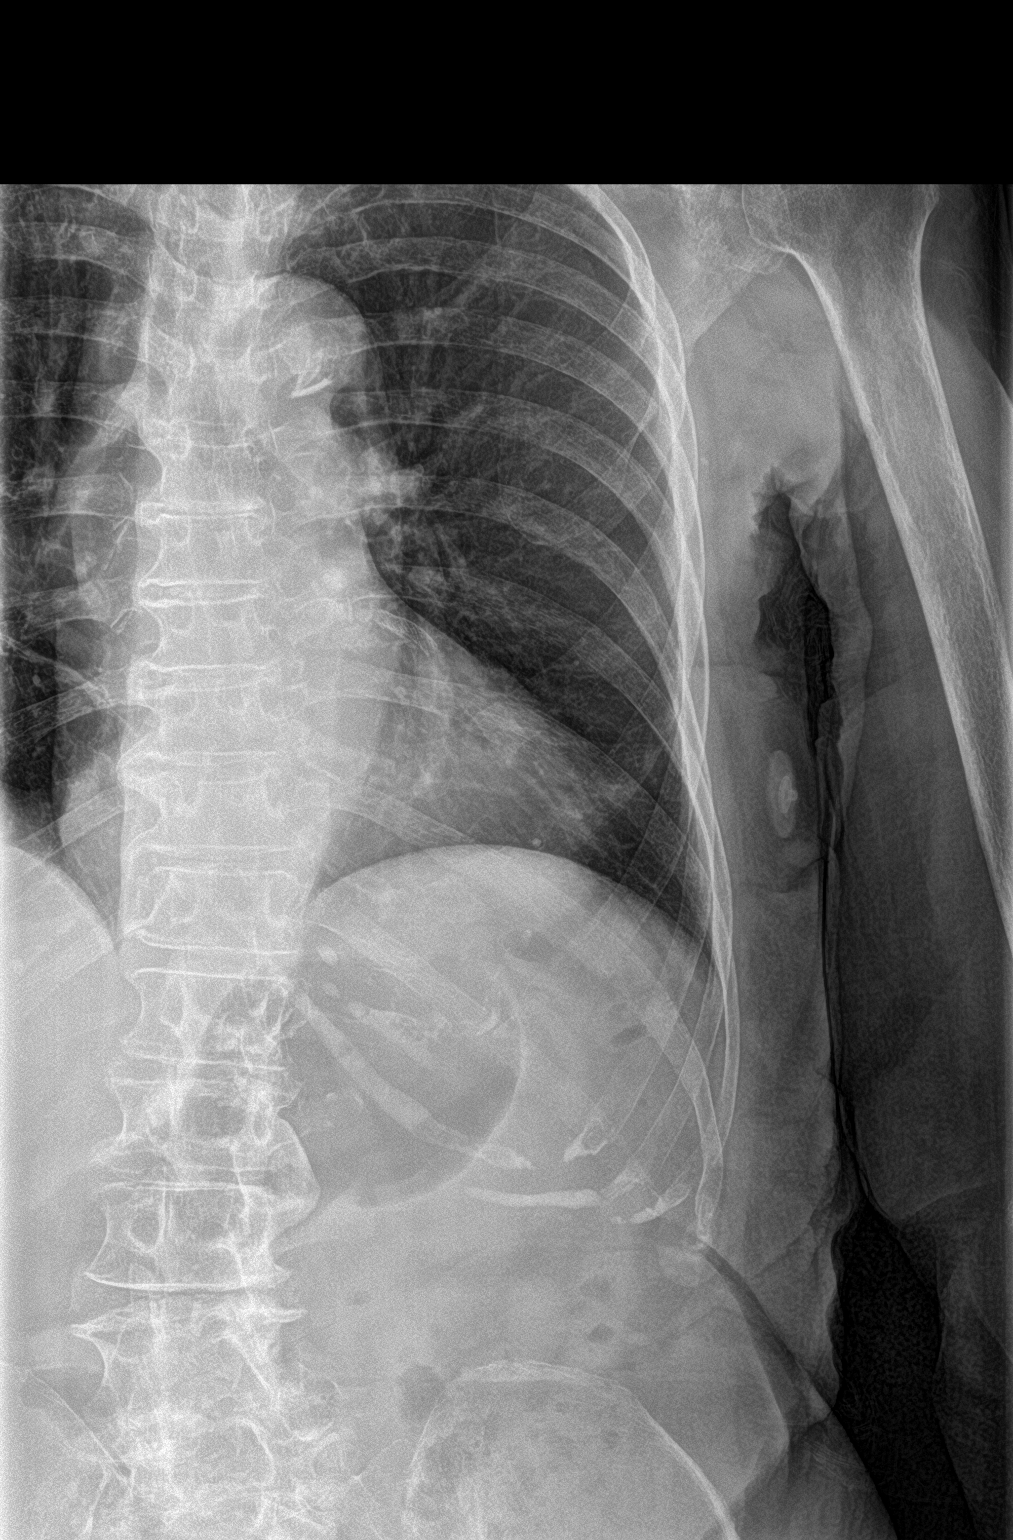

[rib ap obl]
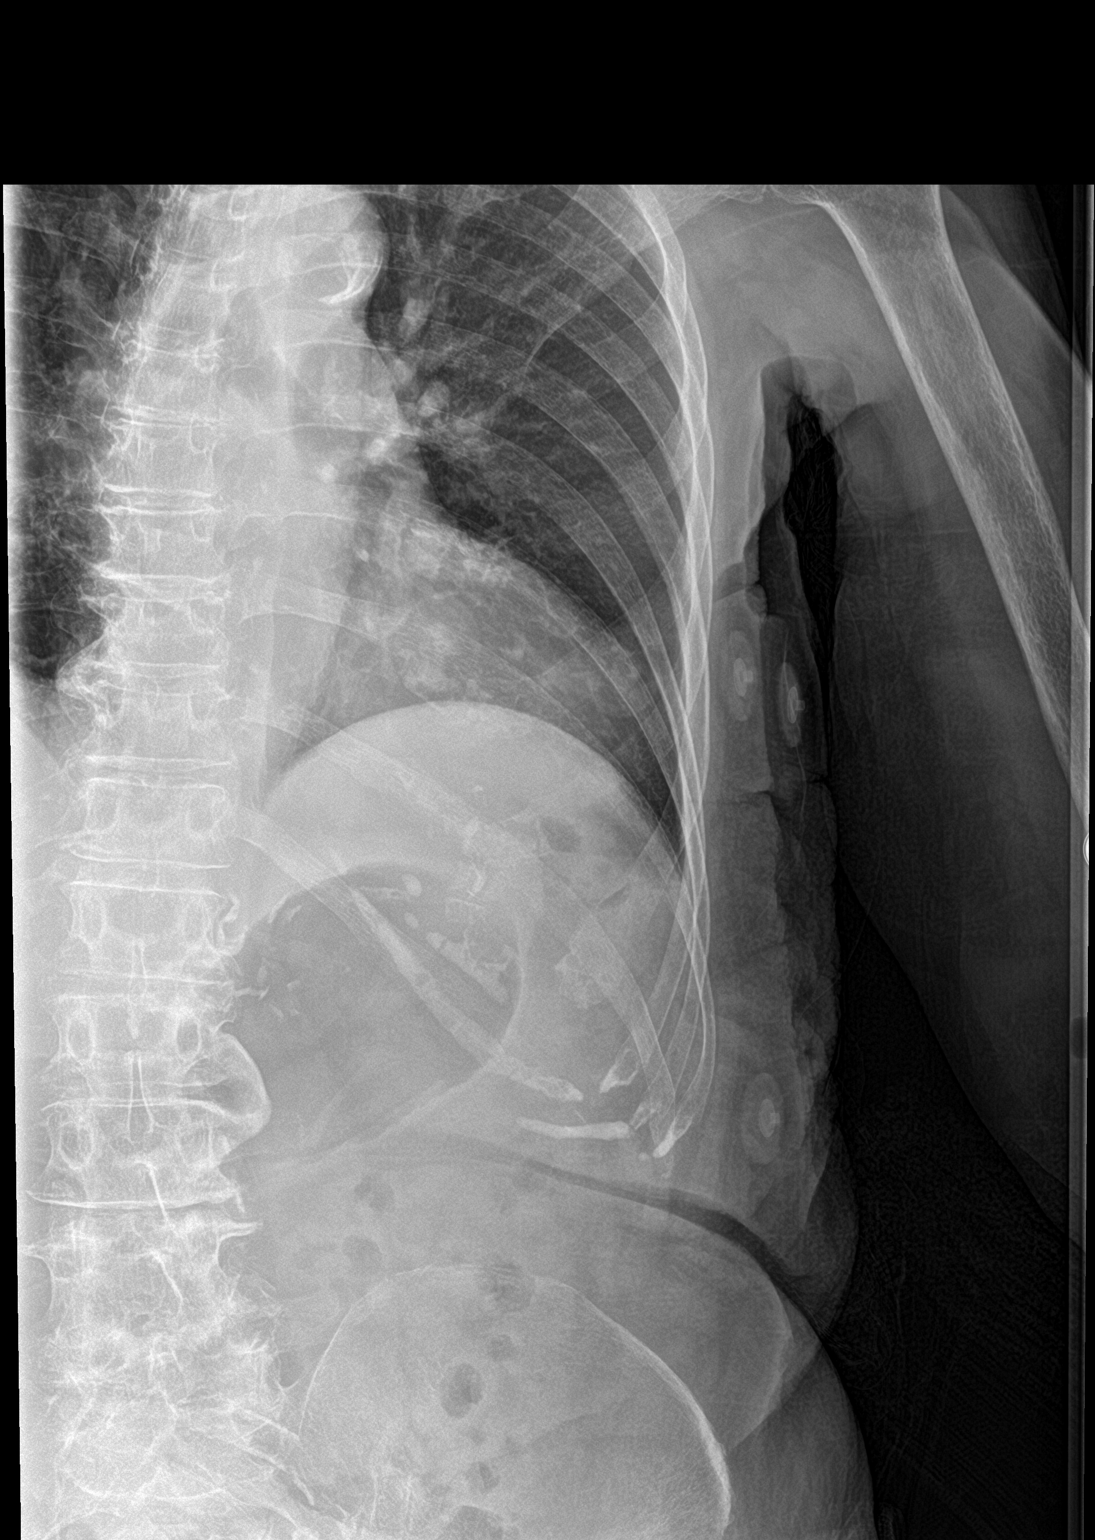

[chest ap]
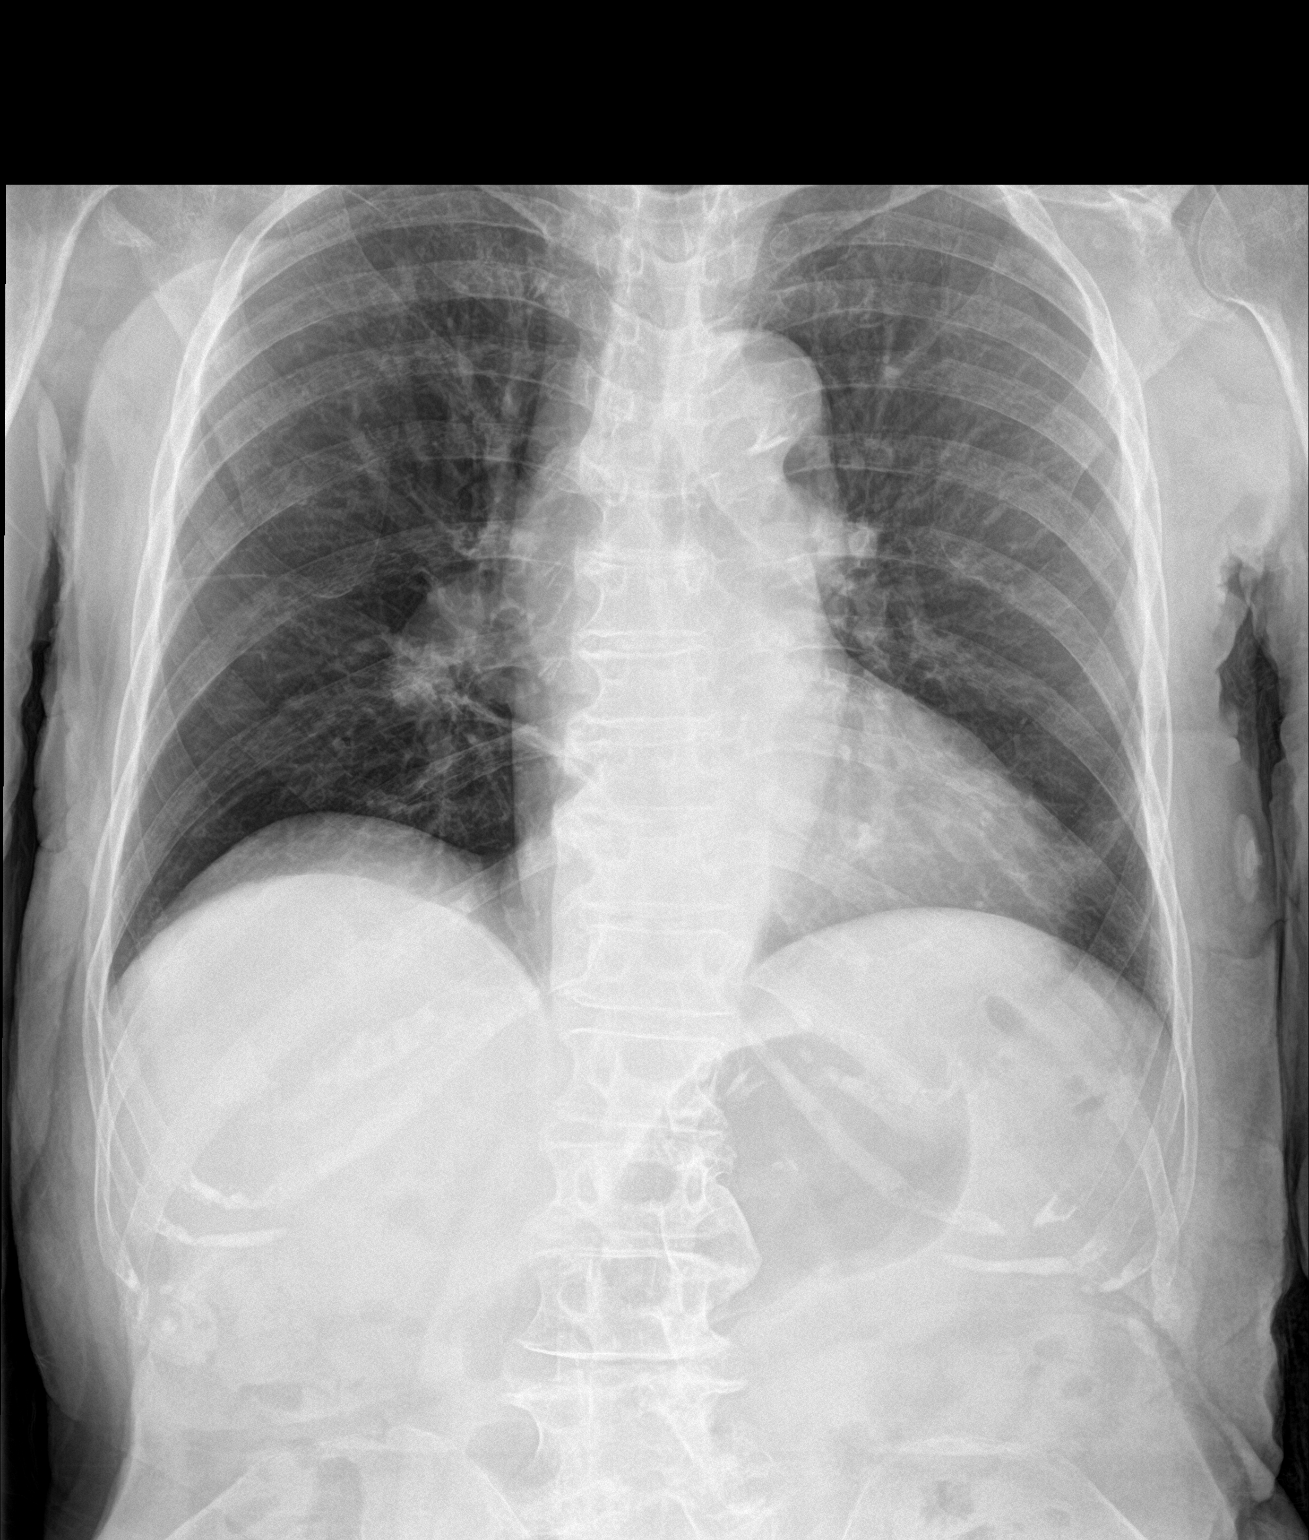

[3 of 3 positions shown; findings below may reference images not displayed]

FINDINGS: Lungs are clear. No evidence of pneumothorax. Cardiomediastinal
silhouette is within normal. There are degenerative changes of the
spine. No evidence of acute fracture.
IMPRESSION: No acute findings.

## 2019-10-12 IMAGING — CT CT CERVICAL SPINE W/O CM
2 of 14 series · 4 of 33 positions shown, 5 images · non-contrast
Comparison: CT head and cervical spine dated March 14, 2019.

CLINICAL DATA: Fall.

EXAM:
CT HEAD WITHOUT CONTRAST
CT CERVICAL SPINE WITHOUT CONTRAST
TECHNIQUE: Multidetector CT imaging of the head and cervical spine was
performed following the standard protocol without intravenous
contrast. Multiplanar CT image reconstructions of the cervical spine
were also generated.

[Series 10: sagittals · sagittal · 0.27mm/px · 2 of 84 slices shown]
[im 28/84  bone]
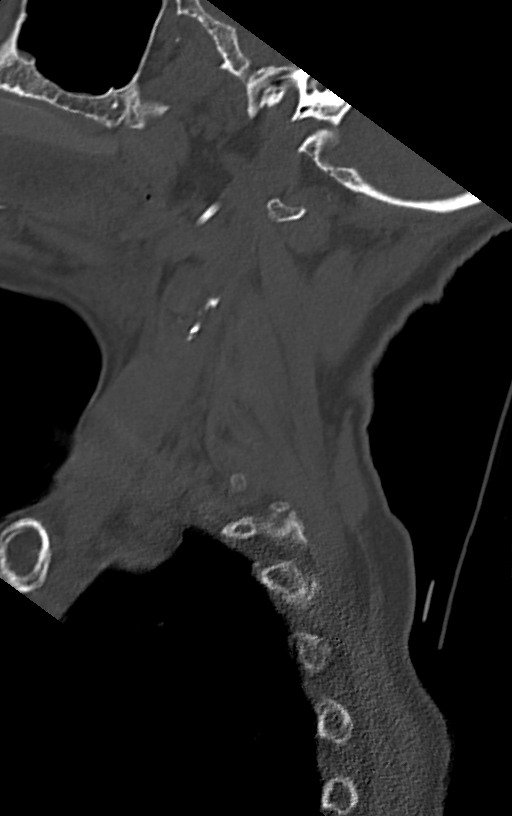
[im 56/84  bone]
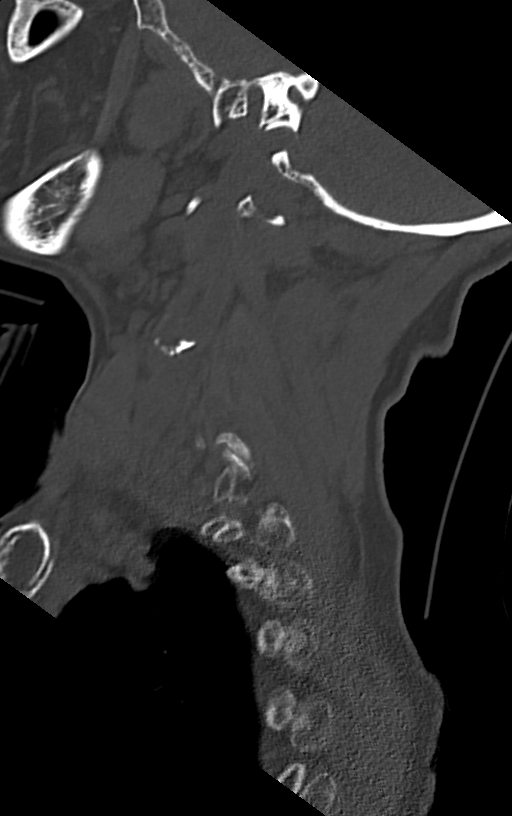

[Series 11: orthogonals · axial · 0.27mm/px · z∈[+773,+832]mm · 2 of 111 slices shown, 3 images]
[im 37/111  soft-tissue]
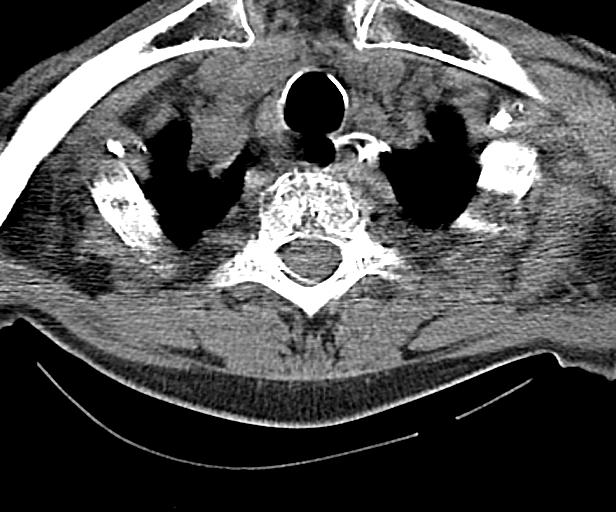
[im 37/111  bone]
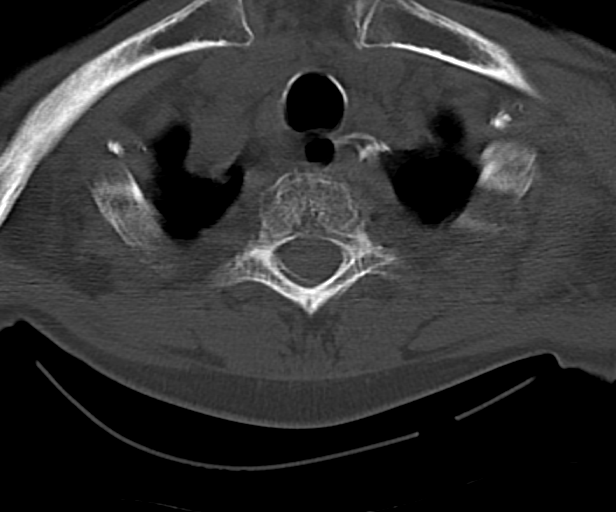
[im 74/111  bone]
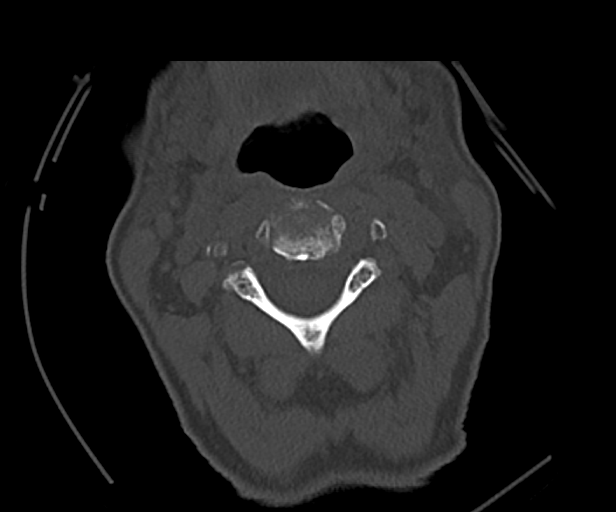

[4 of 33 positions shown; findings below may reference images not displayed]

FINDINGS: CT HEAD FINDINGS

Brain: No evidence of acute infarction, hemorrhage, hydrocephalus,
extra-axial collection or mass lesion/mass effect. Stable atrophy
and chronic microvascular ischemic changes.

Vascular: Calcified atherosclerosis at the skullbase. No hyperdense
vessel.

Skull: Negative for fracture or focal lesion.

Sinuses/Orbits: No acute finding.

Other: Small left forehead scalp hematoma and laceration.

CT CERVICAL SPINE FINDINGS

Alignment: No traumatic malalignment.

Skull base and vertebrae: No acute fracture. No primary bone lesion
or focal pathologic process.

Soft tissues and spinal canal: No prevertebral fluid or swelling. No
visible canal hematoma.

Disc levels: Overall moderate diffuse cervical spondylosis, similar
to prior study.

Upper chest: Minimal centrilobular emphysema.

Other: None.
IMPRESSION: 1. No acute intracranial abnormality. Small left forehead scalp
hematoma and laceration.
2.  No acute cervical spine fracture.

## 2019-10-15 IMAGING — MR MR MRA HEAD W/O CM
1 series · 23 of 48 positions shown · non-contrast
Comparison: Head CT 08/23/2019

CLINICAL DATA: Syncope and collapse

EXAM:
MRI HEAD WITHOUT CONTRAST
MRA HEAD WITHOUT CONTRAST
TECHNIQUE: Multiplanar, multiecho pulse sequences of the brain and surrounding
structures were obtained without intravenous contrast. Angiographic
images of the head were obtained using MRA technique without
contrast.

[Series 9: 3d cow · axial · 0.5mm · 0.41mm/px · z∈[-111,-21]mm · 23 of 188 slices shown]
[im 1/188]
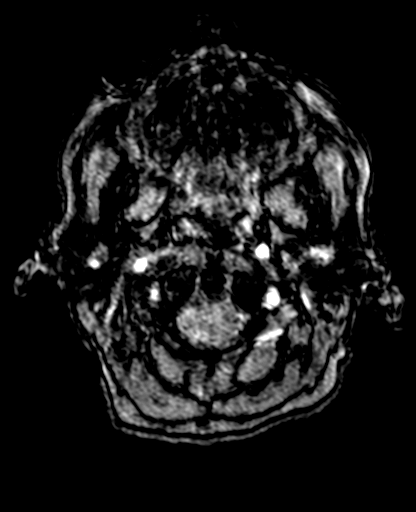
[im 4/188]
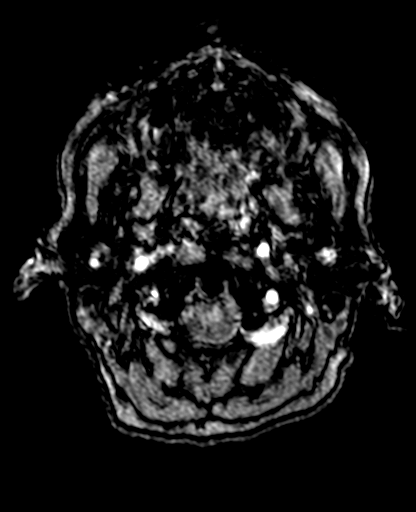
[im 8/188]
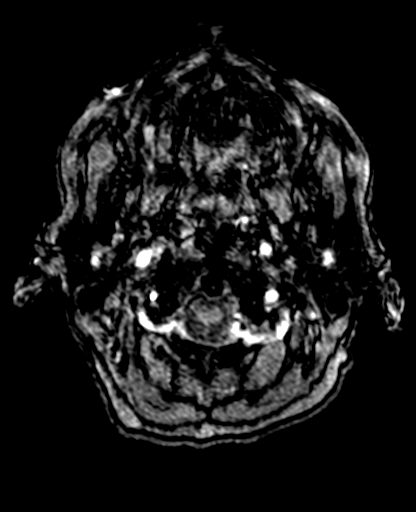
[im 12/188]
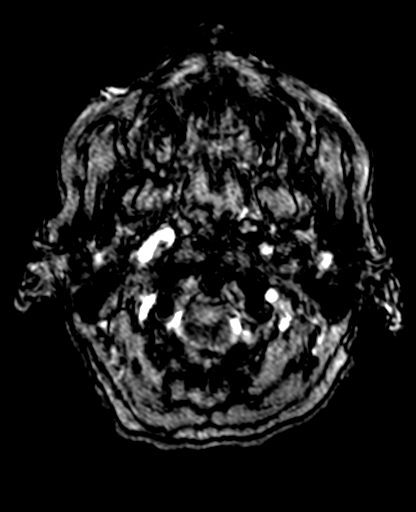
[im 16/188]
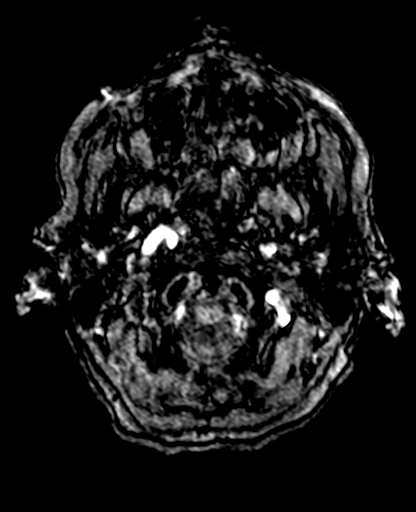
[im 20/188]
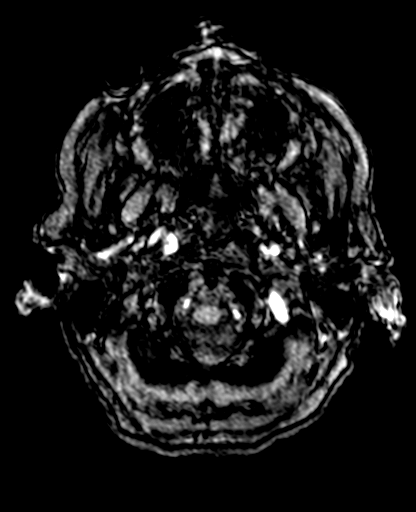
[im 24/188]
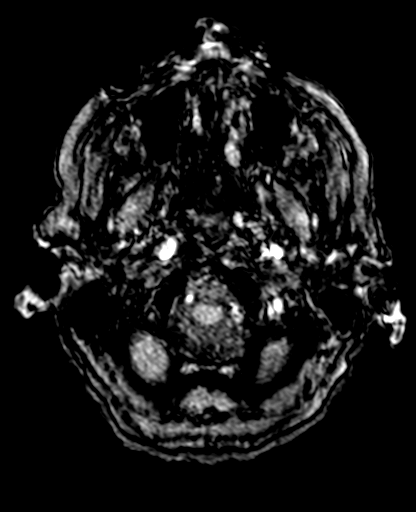
[im 28/188]
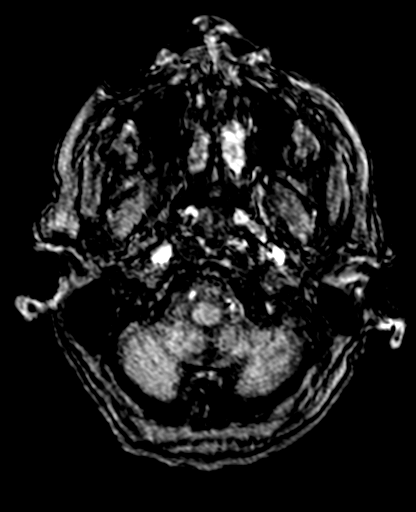
[im 32/188]
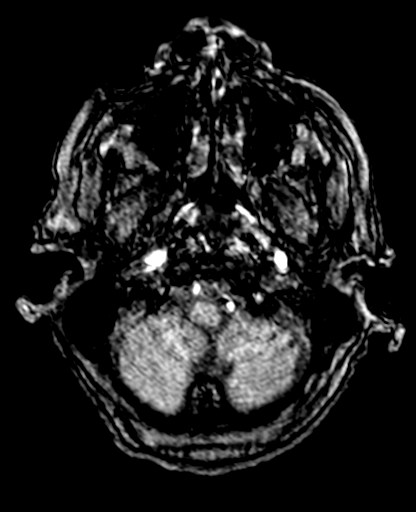
[im 36/188]
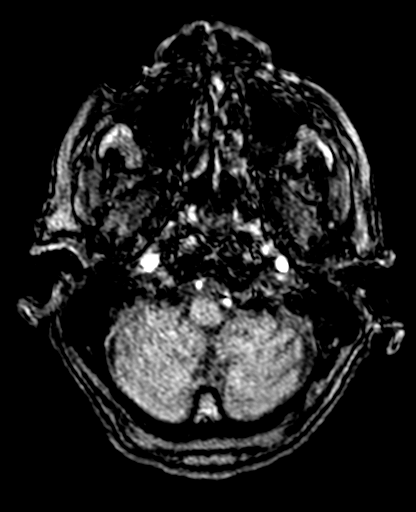
[im 40/188]
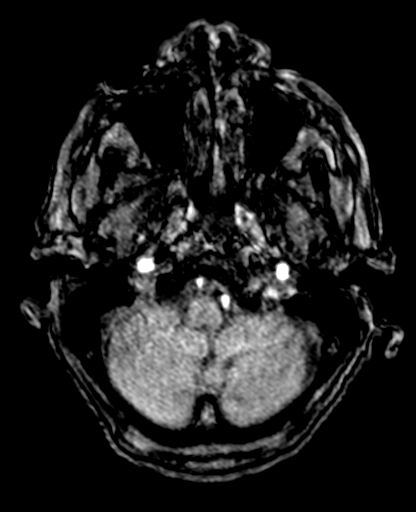
[im 44/188]
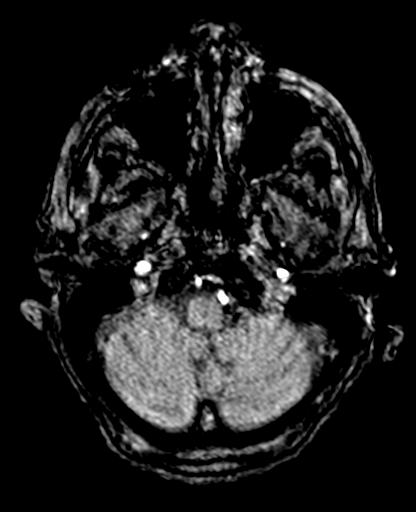
[im 48/188]
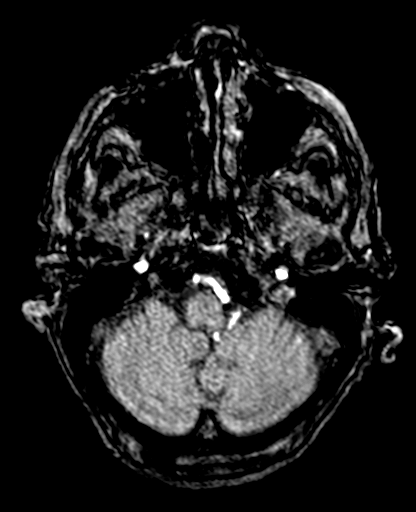
[im 52/188]
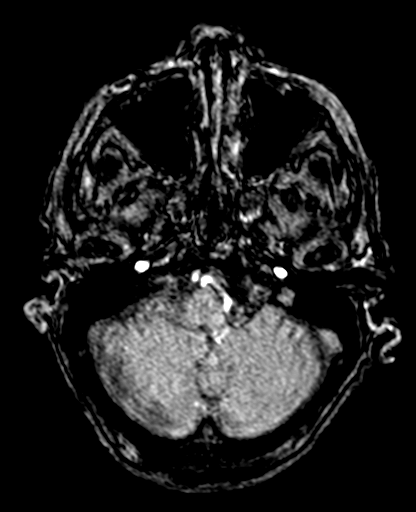
[im 56/188]
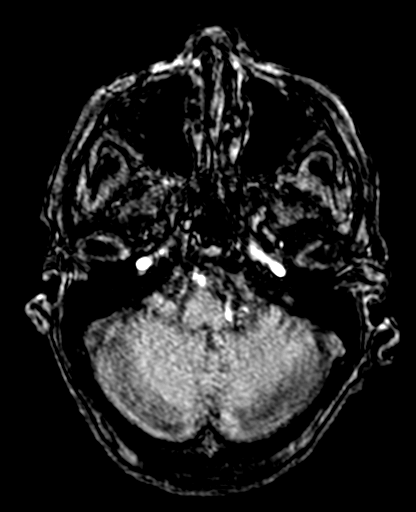
[im 60/188]
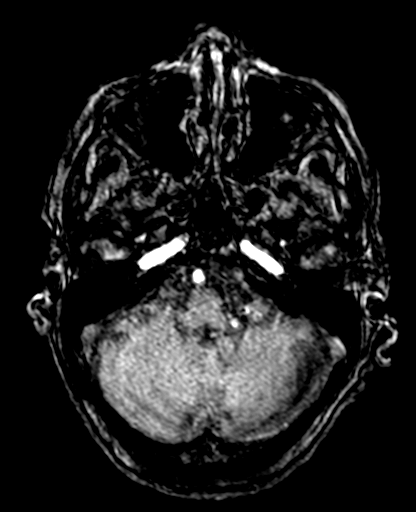
[im 84/188]
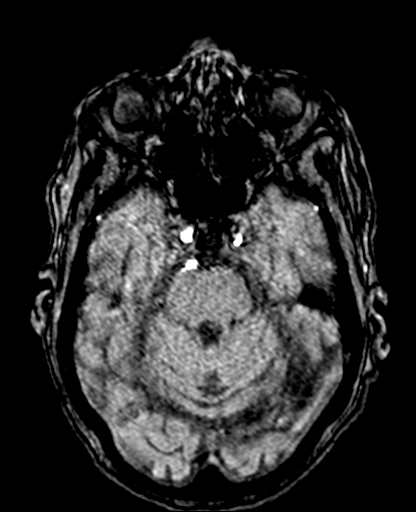
[im 96/188]
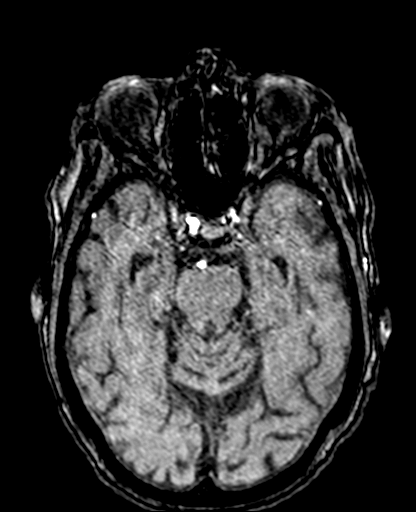
[im 108/188]
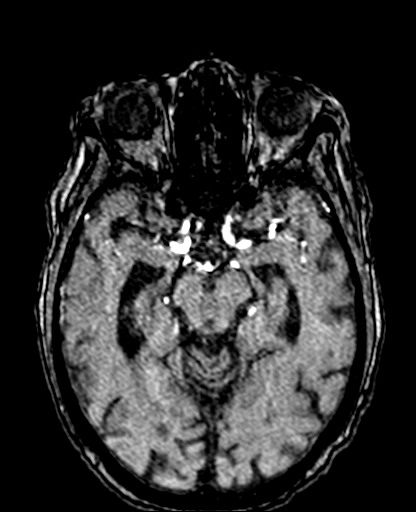
[im 132/188]
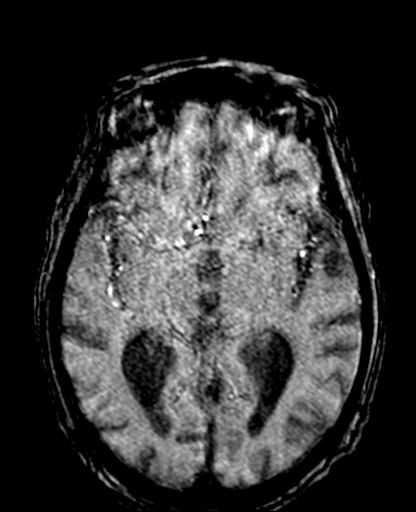
[im 156/188]
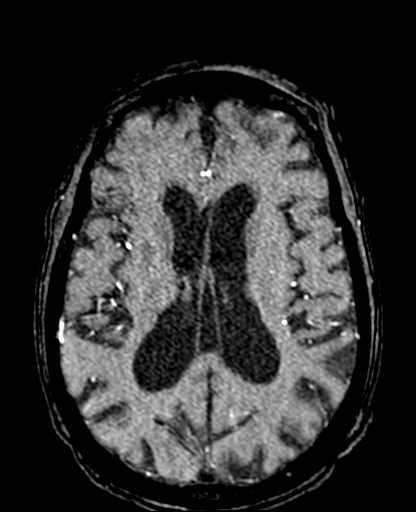
[im 160/188]
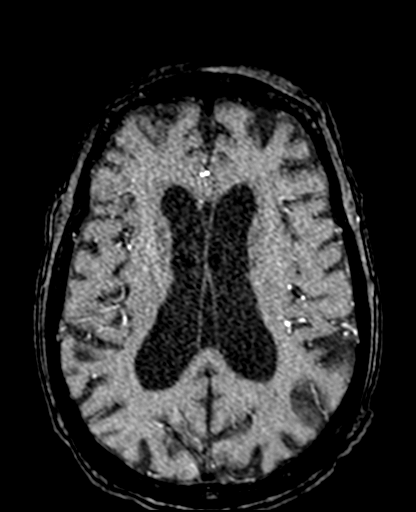
[im 180/188]
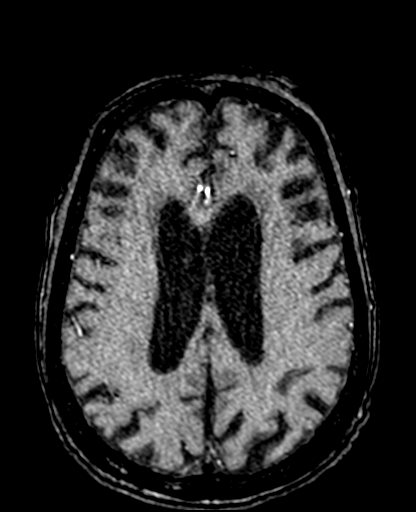

[23 of 48 positions shown; findings below may reference images not displayed]

FINDINGS: MRI HEAD FINDINGS

BRAIN: There is no acute infarct, acute hemorrhage or extra-axial
collection. Diffuse confluent hyperintense T2-weighted signal within
the periventricular, deep and juxtacortical white matter, most
commonly due to chronic ischemic microangiopathy. There is
generalized atrophy without lobar predilection. There are multiple
old small vessel infarcts of the deep gray nuclei. The midline
structures are normal.

VASCULAR: The major intracranial arterial and venous sinus flow
voids are normal. Two foci of supratentorial chronic
microhemorrhage.

SKULL AND UPPER CERVICAL SPINE: Calvarial bone marrow signal is
normal. There is no skull base mass. The visualized upper cervical
spine and soft tissues are normal.

SINUSES/ORBITS: There are no fluid levels or advanced mucosal
thickening. The mastoid air cells and middle ear cavities are free
of fluid. The orbits are normal.

MRA HEAD FINDINGS

The angiographic portion of the study is moderately motion degraded.

POSTERIOR CIRCULATION:

--Vertebral arteries: Patent V4 segments.

--Posterior inferior cerebellar arteries (PICA): Poor visualization
due to motion.

--Anterior inferior cerebellar arteries (AICA): Poor visualization
due to motion.

--Basilar artery: Normal.

--Superior cerebellar arteries: Normal.

--Posterior cerebral arteries: Normal. Both originate from the
basilar artery. Posterior communicating arteries (p-comm) are
diminutive or absent.

ANTERIOR CIRCULATION:

--Intracranial internal carotid arteries: Multifocal irregularity,
likely due to atherosclerosis, though probably exaggerated by
motion.

--Anterior cerebral arteries (ACA): Patent

--Middle cerebral arteries (MCA): patent
IMPRESSION: 1. No acute intracranial abnormality.
2. Atrophy, advanced chronic small vessel ischemic microangiopathy
and multiple old deep gray nuclei infarcts.
3. Moderately motion degraded MRA. No emergent large vessel
occlusion.

## 2019-10-15 IMAGING — MR MR HEAD W/O CM
11 of 13 series · 37 of 48 positions shown · non-contrast
Comparison: Head CT 08/23/2019

CLINICAL DATA: Syncope and collapse

EXAM:
MRI HEAD WITHOUT CONTRAST
MRA HEAD WITHOUT CONTRAST
TECHNIQUE: Multiplanar, multiecho pulse sequences of the brain and surrounding
structures were obtained without intravenous contrast. Angiographic
images of the head were obtained using MRA technique without
contrast.

[Series 5: DWI · axial · 3.0mm · 0.88mm/px · z∈[-90,+44]mm · 7 of 92 slices shown (1 of 4)]
[im 1/92]
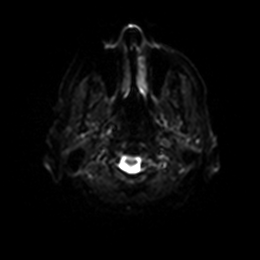
[im 16/92]
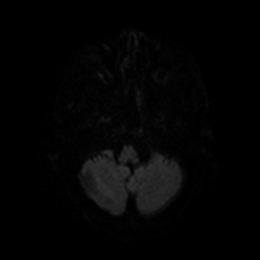
[im 31/92]
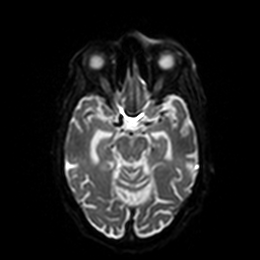
[im 46/92]
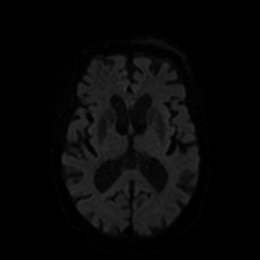
[im 61/92]
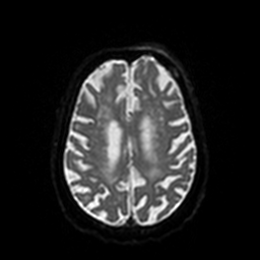
[im 76/92]
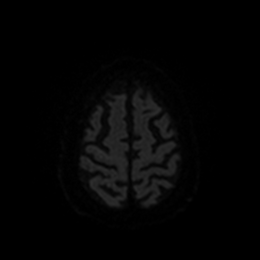
[im 92/92]
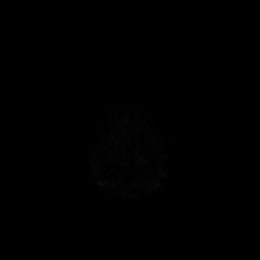

[Series 6: DWI · axial · 3.0mm · 0.88mm/px · z∈[-90,+44]mm · 3 of 46 slices shown (2 of 4)]
[im 1/46]
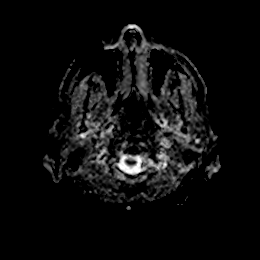
[im 23/46]
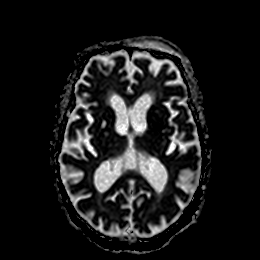
[im 46/46]
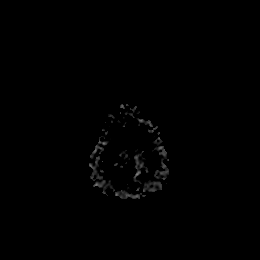

[Series 7: DWI · coronal · 4.0mm · 0.88mm/px · 6 of 68 slices shown (3 of 4)]
[im 1/68]
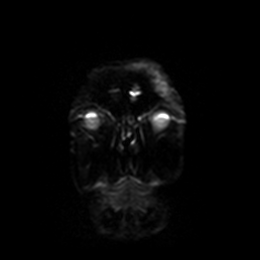
[im 14/68]
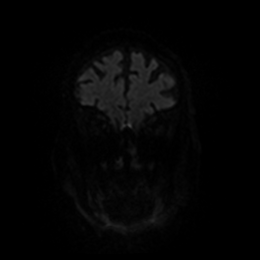
[im 27/68]
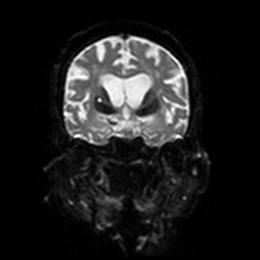
[im 41/68]
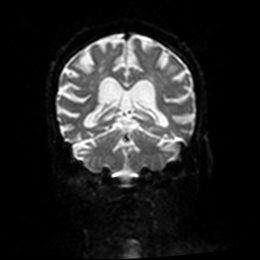
[im 54/68]
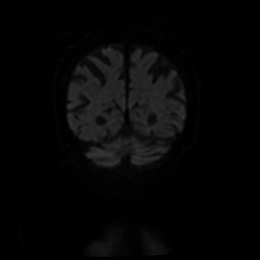
[im 68/68]
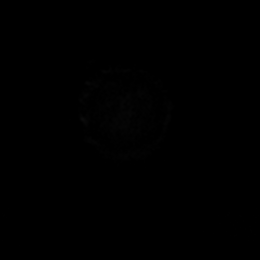

[Series 8: DWI · coronal · 4.0mm · 0.88mm/px · 3 of 34 slices shown (4 of 4)]
[im 1/34]
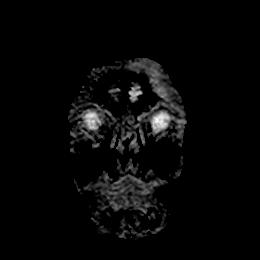
[im 17/34]
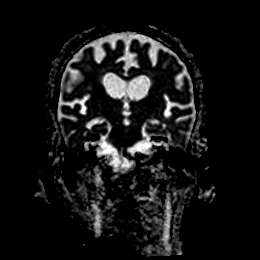
[im 34/34]
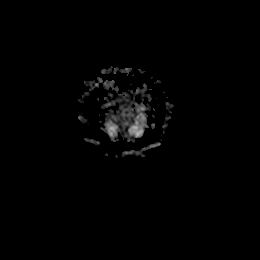

[Series 9: T1 · sagittal · 5.0mm · 0.75mm/px · 2 of 25 slices shown]
[im 1/25]
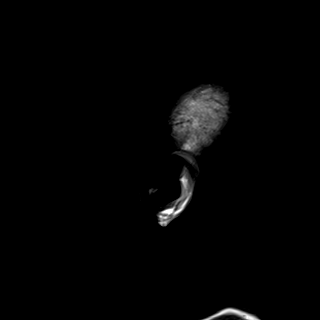
[im 25/25]
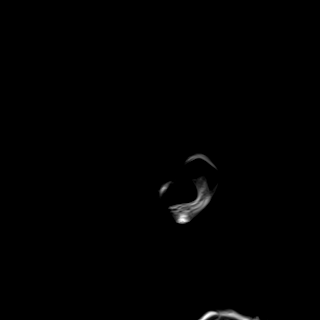

[Series 10: T2 · axial · 5.0mm · 0.72mm/px · z∈[-87,+56]mm · 2 of 25 slices shown (1 of 3)]
[im 1/25]
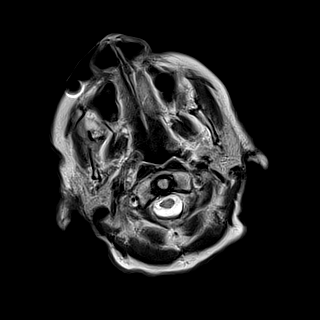
[im 25/25]
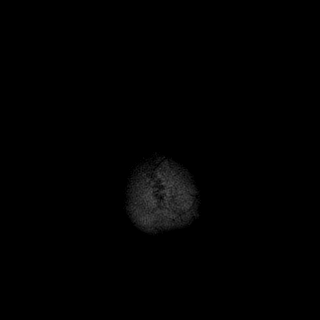

[Series 11: FLAIR · axial · 5.0mm · 0.45mm/px · z∈[-87,+56]mm · 2 of 25 slices shown (1 of 2)]
[im 1/25]
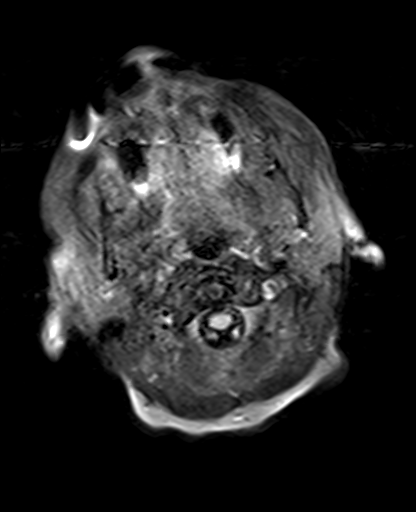
[im 25/25]
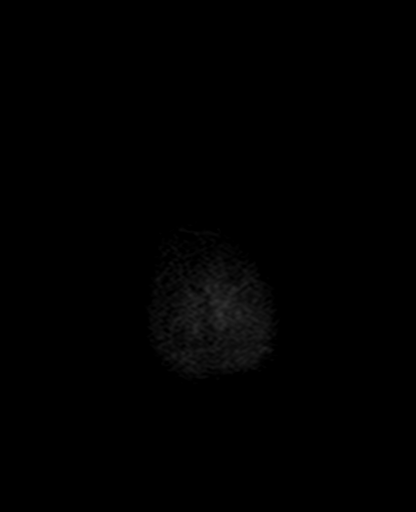

[Series 13: pha_images · axial · 3.0mm · 0.90mm/px · z∈[-103,+22]mm · 4 of 58 slices shown]
[im 1/58]
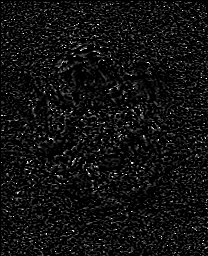
[im 15/58]
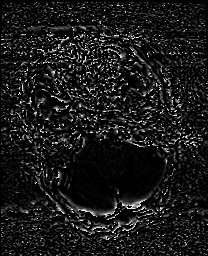
[im 29/58]
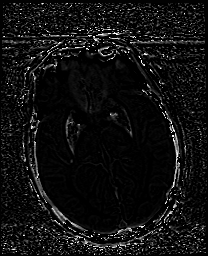
[im 43/58]
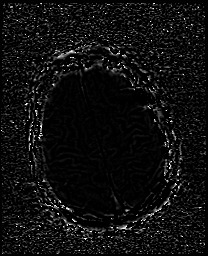

[Series 17: T2 · coronal · 5.0mm · 0.34mm/px · 2 of 29 slices shown (2 of 3)]
[im 1/29]
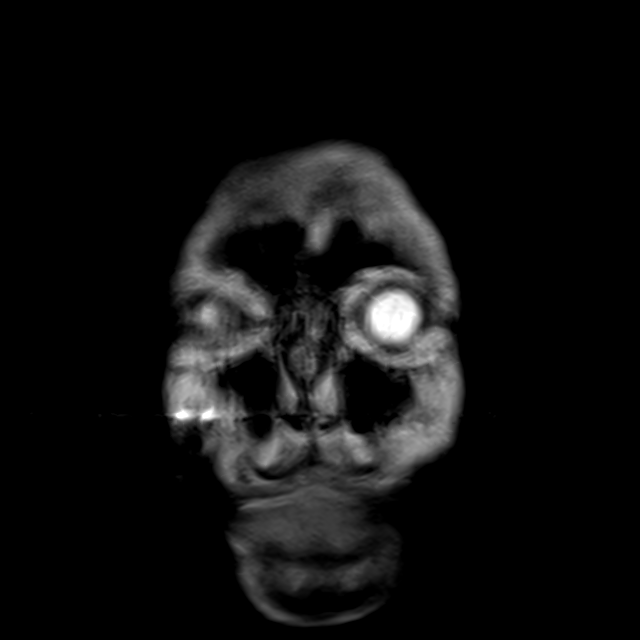
[im 29/29]
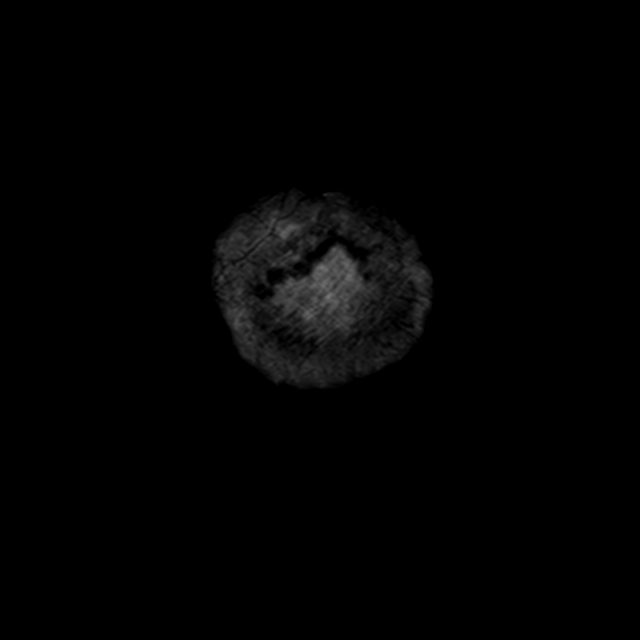

[Series 22: T2 · coronal · 3.0mm · 0.27mm/px · 3 of 32 slices shown (3 of 3)]
[im 1/32]
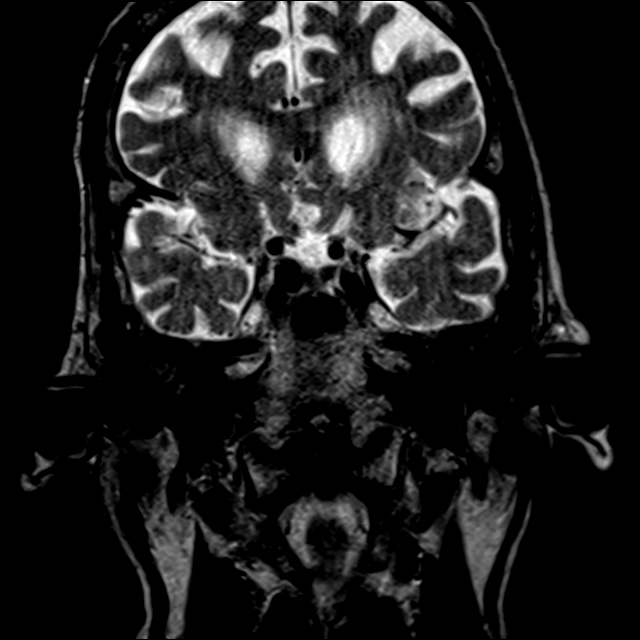
[im 16/32]
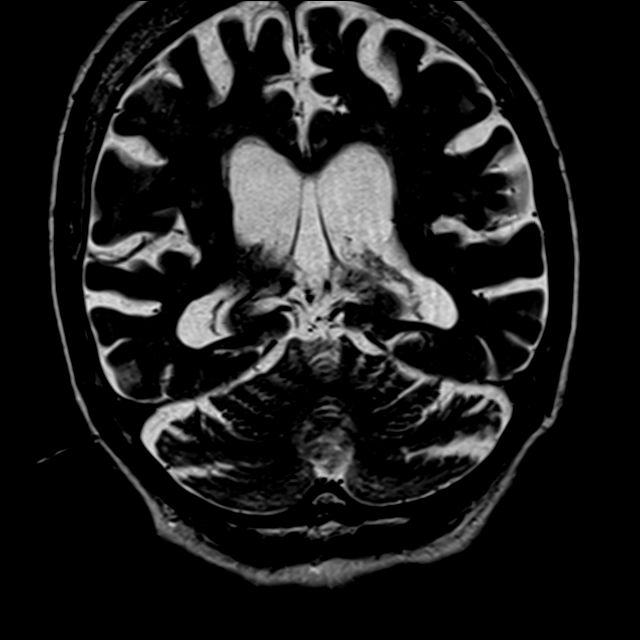
[im 32/32]
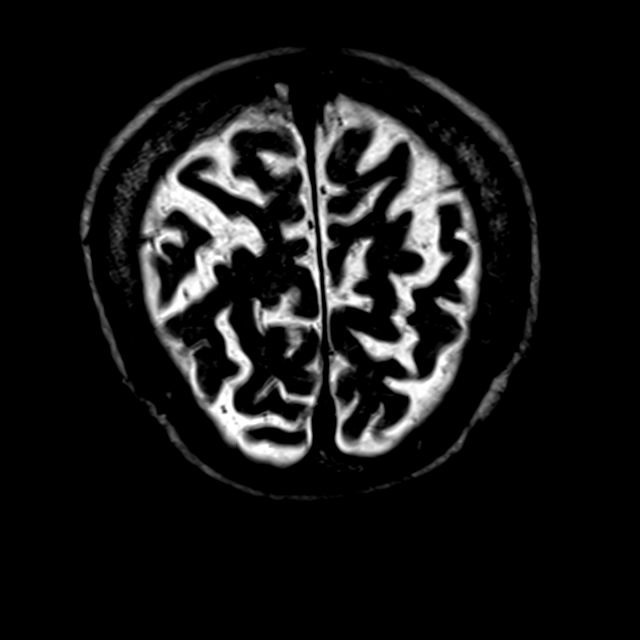

[Series 24: FLAIR · coronal · 3.0mm · 0.56mm/px · 3 of 32 slices shown (2 of 2)]
[im 1/32]
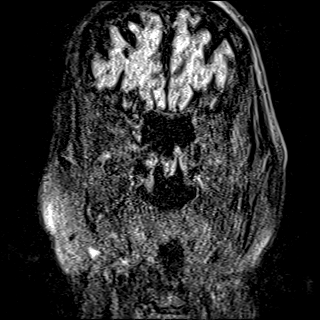
[im 16/32]
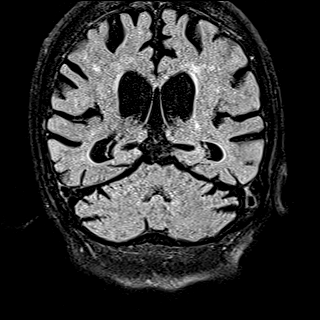
[im 32/32]
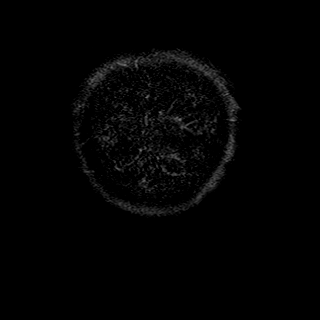

[37 of 48 positions shown; findings below may reference images not displayed]

FINDINGS: MRI HEAD FINDINGS

BRAIN: There is no acute infarct, acute hemorrhage or extra-axial
collection. Diffuse confluent hyperintense T2-weighted signal within
the periventricular, deep and juxtacortical white matter, most
commonly due to chronic ischemic microangiopathy. There is
generalized atrophy without lobar predilection. There are multiple
old small vessel infarcts of the deep gray nuclei. The midline
structures are normal.

VASCULAR: The major intracranial arterial and venous sinus flow
voids are normal. Two foci of supratentorial chronic
microhemorrhage.

SKULL AND UPPER CERVICAL SPINE: Calvarial bone marrow signal is
normal. There is no skull base mass. The visualized upper cervical
spine and soft tissues are normal.

SINUSES/ORBITS: There are no fluid levels or advanced mucosal
thickening. The mastoid air cells and middle ear cavities are free
of fluid. The orbits are normal.

MRA HEAD FINDINGS

The angiographic portion of the study is moderately motion degraded.

POSTERIOR CIRCULATION:

--Vertebral arteries: Patent V4 segments.

--Posterior inferior cerebellar arteries (PICA): Poor visualization
due to motion.

--Anterior inferior cerebellar arteries (AICA): Poor visualization
due to motion.

--Basilar artery: Normal.

--Superior cerebellar arteries: Normal.

--Posterior cerebral arteries: Normal. Both originate from the
basilar artery. Posterior communicating arteries (p-comm) are
diminutive or absent.

ANTERIOR CIRCULATION:

--Intracranial internal carotid arteries: Multifocal irregularity,
likely due to atherosclerosis, though probably exaggerated by
motion.

--Anterior cerebral arteries (ACA): Patent

--Middle cerebral arteries (MCA): patent
IMPRESSION: 1. No acute intracranial abnormality.
2. Atrophy, advanced chronic small vessel ischemic microangiopathy
and multiple old deep gray nuclei infarcts.
3. Moderately motion degraded MRA. No emergent large vessel
occlusion.

## 2019-10-16 ENCOUNTER — Other Ambulatory Visit: Payer: Self-pay

## 2019-10-16 NOTE — Patient Outreach (Signed)
[facility] [facility]) Care Management  10/16/2019  [name] 05-14-1924 [id number]   Message sent to [facility] regarding status of referral to [employer]; no response to message sent on 09/30/19. Dwight Mission with [facility] regarding status of referral.  [name] has been unable to connect with patient's daughter. She provided me with her direct office number and requested that daughter call her on 10/17/19 as she will be in the office that day.  Called daughter and provided this information.  Will follow up with her again next week.  Ronn Melena, BSW Social Worker [phone]

## 2019-10-23 ENCOUNTER — Ambulatory Visit: Payer: Self-pay

## 2019-10-26 ENCOUNTER — Other Ambulatory Visit: Payer: Self-pay | Admitting: Family Medicine

## 2019-10-27 ENCOUNTER — Other Ambulatory Visit: Payer: Self-pay

## 2019-10-27 NOTE — Patient Outreach (Addendum)
[facility]) Care Management  10/27/2019  [name] 1924/04/05 [id number]   Follow up call to daughter today regarding referrals to [facility] for ramp and home modification.   Daughter was able to connect with [name] from [facility] to provide additional information needed.  Daughter received a voicemail message regarding scheduling of home assessment nd intends to call back today.   Left voicemail message for [facility] with [employer] requesting update on status of referral. Also emailed her. Emails sent on 09/30/19 and 10/15/19 with no response. Will follow up with daughter when response is received.  Addendum: Received return call from [facility] with [facility].  She still has referral for ramp but assessments are not being conducted until next year. Per Kermit Balo, the issues with patient's ceiling were not reported when referral was originally submitted by daughter.  Kermit Balo asked that daughter call her so that this can be discussed further. Left voicemail message for daughter with above information.    Ronn Melena, BSW Social Worker [phone]

## 2019-11-10 ENCOUNTER — Other Ambulatory Visit: Payer: Self-pay | Admitting: Family Medicine

## 2019-11-14 DIAGNOSIS — E43 Unspecified severe protein-calorie malnutrition: Secondary | ICD-10-CM | POA: Diagnosis not present

## 2019-11-14 DIAGNOSIS — R569 Unspecified convulsions: Secondary | ICD-10-CM | POA: Diagnosis not present

## 2019-11-14 DIAGNOSIS — Z741 Need for assistance with personal care: Secondary | ICD-10-CM | POA: Diagnosis not present

## 2019-11-14 DIAGNOSIS — I110 Hypertensive heart disease with heart failure: Secondary | ICD-10-CM | POA: Diagnosis not present

## 2019-11-14 DIAGNOSIS — K922 Gastrointestinal hemorrhage, unspecified: Secondary | ICD-10-CM | POA: Diagnosis not present

## 2019-11-14 DIAGNOSIS — R32 Unspecified urinary incontinence: Secondary | ICD-10-CM | POA: Diagnosis not present

## 2019-11-14 DIAGNOSIS — K219 Gastro-esophageal reflux disease without esophagitis: Secondary | ICD-10-CM | POA: Diagnosis not present

## 2019-11-14 DIAGNOSIS — Z6820 Body mass index (BMI) 20.0-20.9, adult: Secondary | ICD-10-CM | POA: Diagnosis not present

## 2019-11-14 DIAGNOSIS — L89152 Pressure ulcer of sacral region, stage 2: Secondary | ICD-10-CM | POA: Diagnosis not present

## 2019-11-14 DIAGNOSIS — I2510 Atherosclerotic heart disease of native coronary artery without angina pectoris: Secondary | ICD-10-CM | POA: Diagnosis not present

## 2019-11-14 DIAGNOSIS — F0280 Dementia in other diseases classified elsewhere without behavioral disturbance: Secondary | ICD-10-CM | POA: Diagnosis not present

## 2019-11-14 DIAGNOSIS — L89516 Pressure-induced deep tissue damage of right ankle: Secondary | ICD-10-CM | POA: Diagnosis not present

## 2019-11-14 DIAGNOSIS — F17220 Nicotine dependence, chewing tobacco, uncomplicated: Secondary | ICD-10-CM | POA: Diagnosis not present

## 2019-11-14 DIAGNOSIS — J302 Other seasonal allergic rhinitis: Secondary | ICD-10-CM | POA: Diagnosis not present

## 2019-11-14 DIAGNOSIS — L89526 Pressure-induced deep tissue damage of left ankle: Secondary | ICD-10-CM | POA: Diagnosis not present

## 2019-11-14 DIAGNOSIS — I509 Heart failure, unspecified: Secondary | ICD-10-CM | POA: Diagnosis not present

## 2019-11-14 DIAGNOSIS — G309 Alzheimer's disease, unspecified: Secondary | ICD-10-CM | POA: Diagnosis not present

## 2019-11-18 ENCOUNTER — Other Ambulatory Visit: Payer: Self-pay | Admitting: Family Medicine

## 2019-11-19 DIAGNOSIS — I2510 Atherosclerotic heart disease of native coronary artery without angina pectoris: Secondary | ICD-10-CM | POA: Diagnosis not present

## 2019-11-19 DIAGNOSIS — F0280 Dementia in other diseases classified elsewhere without behavioral disturbance: Secondary | ICD-10-CM | POA: Diagnosis not present

## 2019-11-19 DIAGNOSIS — I110 Hypertensive heart disease with heart failure: Secondary | ICD-10-CM | POA: Diagnosis not present

## 2019-11-19 DIAGNOSIS — G309 Alzheimer's disease, unspecified: Secondary | ICD-10-CM | POA: Diagnosis not present

## 2019-11-19 DIAGNOSIS — E43 Unspecified severe protein-calorie malnutrition: Secondary | ICD-10-CM | POA: Diagnosis not present

## 2019-11-19 DIAGNOSIS — I509 Heart failure, unspecified: Secondary | ICD-10-CM | POA: Diagnosis not present

## 2019-11-20 DIAGNOSIS — G309 Alzheimer's disease, unspecified: Secondary | ICD-10-CM | POA: Diagnosis not present

## 2019-11-20 DIAGNOSIS — E43 Unspecified severe protein-calorie malnutrition: Secondary | ICD-10-CM | POA: Diagnosis not present

## 2019-11-20 DIAGNOSIS — I110 Hypertensive heart disease with heart failure: Secondary | ICD-10-CM | POA: Diagnosis not present

## 2019-11-20 DIAGNOSIS — I2510 Atherosclerotic heart disease of native coronary artery without angina pectoris: Secondary | ICD-10-CM | POA: Diagnosis not present

## 2019-11-20 DIAGNOSIS — I509 Heart failure, unspecified: Secondary | ICD-10-CM | POA: Diagnosis not present

## 2019-11-20 DIAGNOSIS — F0280 Dementia in other diseases classified elsewhere without behavioral disturbance: Secondary | ICD-10-CM | POA: Diagnosis not present

## 2019-11-24 DIAGNOSIS — F0280 Dementia in other diseases classified elsewhere without behavioral disturbance: Secondary | ICD-10-CM | POA: Diagnosis not present

## 2019-11-24 DIAGNOSIS — I2510 Atherosclerotic heart disease of native coronary artery without angina pectoris: Secondary | ICD-10-CM | POA: Diagnosis not present

## 2019-11-24 DIAGNOSIS — E43 Unspecified severe protein-calorie malnutrition: Secondary | ICD-10-CM | POA: Diagnosis not present

## 2019-11-24 DIAGNOSIS — G309 Alzheimer's disease, unspecified: Secondary | ICD-10-CM | POA: Diagnosis not present

## 2019-11-24 DIAGNOSIS — I110 Hypertensive heart disease with heart failure: Secondary | ICD-10-CM | POA: Diagnosis not present

## 2019-11-24 DIAGNOSIS — I509 Heart failure, unspecified: Secondary | ICD-10-CM | POA: Diagnosis not present

## 2019-11-25 DIAGNOSIS — I110 Hypertensive heart disease with heart failure: Secondary | ICD-10-CM | POA: Diagnosis not present

## 2019-11-25 DIAGNOSIS — F0280 Dementia in other diseases classified elsewhere without behavioral disturbance: Secondary | ICD-10-CM | POA: Diagnosis not present

## 2019-11-25 DIAGNOSIS — E43 Unspecified severe protein-calorie malnutrition: Secondary | ICD-10-CM | POA: Diagnosis not present

## 2019-11-25 DIAGNOSIS — G309 Alzheimer's disease, unspecified: Secondary | ICD-10-CM | POA: Diagnosis not present

## 2019-11-25 DIAGNOSIS — I2510 Atherosclerotic heart disease of native coronary artery without angina pectoris: Secondary | ICD-10-CM | POA: Diagnosis not present

## 2019-11-25 DIAGNOSIS — I509 Heart failure, unspecified: Secondary | ICD-10-CM | POA: Diagnosis not present

## 2019-11-26 DIAGNOSIS — I509 Heart failure, unspecified: Secondary | ICD-10-CM | POA: Diagnosis not present

## 2019-11-26 DIAGNOSIS — I2510 Atherosclerotic heart disease of native coronary artery without angina pectoris: Secondary | ICD-10-CM | POA: Diagnosis not present

## 2019-11-26 DIAGNOSIS — E43 Unspecified severe protein-calorie malnutrition: Secondary | ICD-10-CM | POA: Diagnosis not present

## 2019-11-26 DIAGNOSIS — I110 Hypertensive heart disease with heart failure: Secondary | ICD-10-CM | POA: Diagnosis not present

## 2019-11-26 DIAGNOSIS — G309 Alzheimer's disease, unspecified: Secondary | ICD-10-CM | POA: Diagnosis not present

## 2019-11-26 DIAGNOSIS — F0280 Dementia in other diseases classified elsewhere without behavioral disturbance: Secondary | ICD-10-CM | POA: Diagnosis not present

## 2019-11-27 ENCOUNTER — Other Ambulatory Visit: Payer: Self-pay

## 2019-11-27 ENCOUNTER — Ambulatory Visit: Payer: Self-pay

## 2019-11-27 NOTE — Patient Outreach (Signed)
[facility]) Care Management  11/27/2019  [name] May 02, 1924 [id number]   Messaged Tor Netters, Counselor with [facility], regarding status of referral.  Will follow up with patient's daughter when response is received.   Ronn Melena, BSW Social Worker [phone]

## 2019-11-28 DIAGNOSIS — I2510 Atherosclerotic heart disease of native coronary artery without angina pectoris: Secondary | ICD-10-CM | POA: Diagnosis not present

## 2019-11-28 DIAGNOSIS — I110 Hypertensive heart disease with heart failure: Secondary | ICD-10-CM | POA: Diagnosis not present

## 2019-11-28 DIAGNOSIS — E43 Unspecified severe protein-calorie malnutrition: Secondary | ICD-10-CM | POA: Diagnosis not present

## 2019-11-28 DIAGNOSIS — G309 Alzheimer's disease, unspecified: Secondary | ICD-10-CM | POA: Diagnosis not present

## 2019-11-28 DIAGNOSIS — I509 Heart failure, unspecified: Secondary | ICD-10-CM | POA: Diagnosis not present

## 2019-11-28 DIAGNOSIS — F0280 Dementia in other diseases classified elsewhere without behavioral disturbance: Secondary | ICD-10-CM | POA: Diagnosis not present

## 2019-11-30 DIAGNOSIS — F0280 Dementia in other diseases classified elsewhere without behavioral disturbance: Secondary | ICD-10-CM | POA: Diagnosis not present

## 2019-11-30 DIAGNOSIS — E43 Unspecified severe protein-calorie malnutrition: Secondary | ICD-10-CM | POA: Diagnosis not present

## 2019-11-30 DIAGNOSIS — G309 Alzheimer's disease, unspecified: Secondary | ICD-10-CM | POA: Diagnosis not present

## 2019-11-30 DIAGNOSIS — I110 Hypertensive heart disease with heart failure: Secondary | ICD-10-CM | POA: Diagnosis not present

## 2019-11-30 DIAGNOSIS — I509 Heart failure, unspecified: Secondary | ICD-10-CM | POA: Diagnosis not present

## 2019-11-30 DIAGNOSIS — I2510 Atherosclerotic heart disease of native coronary artery without angina pectoris: Secondary | ICD-10-CM | POA: Diagnosis not present

## 2019-12-01 DIAGNOSIS — R404 Transient alteration of awareness: Secondary | ICD-10-CM | POA: Diagnosis not present

## 2019-12-01 DIAGNOSIS — I509 Heart failure, unspecified: Secondary | ICD-10-CM | POA: Diagnosis not present

## 2019-12-01 DIAGNOSIS — G309 Alzheimer's disease, unspecified: Secondary | ICD-10-CM | POA: Diagnosis not present

## 2019-12-01 DIAGNOSIS — R4020 Unspecified coma: Secondary | ICD-10-CM | POA: Diagnosis not present

## 2019-12-01 DIAGNOSIS — F0280 Dementia in other diseases classified elsewhere without behavioral disturbance: Secondary | ICD-10-CM | POA: Diagnosis not present

## 2019-12-01 DIAGNOSIS — I110 Hypertensive heart disease with heart failure: Secondary | ICD-10-CM | POA: Diagnosis not present

## 2019-12-01 DIAGNOSIS — E43 Unspecified severe protein-calorie malnutrition: Secondary | ICD-10-CM | POA: Diagnosis not present

## 2019-12-01 DIAGNOSIS — I2510 Atherosclerotic heart disease of native coronary artery without angina pectoris: Secondary | ICD-10-CM | POA: Diagnosis not present

## 2019-12-04 ENCOUNTER — Telehealth: Payer: Self-pay | Admitting: Family Medicine

## 2019-12-04 NOTE — Telephone Encounter (Signed)
Justice dropped off death certificate for patient, I am placing death certificate in PCP'S box.

## 2019-12-08 ENCOUNTER — Other Ambulatory Visit: Payer: Self-pay

## 2019-12-08 NOTE — Patient Outreach (Signed)
[facility]) Care Management  12/08/2019  [name] 27-Jul-1924 [id number]   Messaged Tor Netters, Counselor with [facility], regarding status of referral.  No response to message on 11/27/19.  Will follow up with patient's daughter when response is received.   Ronn Melena, BSW Social Worker [phone]

## 2019-12-15 DIAGNOSIS — 419620001 Death: Secondary | SNOMED CT | POA: Diagnosis not present

## 2019-12-15 DEATH — deceased

## 2019-12-16 ENCOUNTER — Other Ambulatory Visit: Payer: Self-pay

## 2019-12-16 ENCOUNTER — Ambulatory Visit: Payer: Self-pay

## 2019-12-16 NOTE — Patient Outreach (Addendum)
[place] [facility]) Care Management  12/16/2019  [name] 11-Feb-1924 [id number]   Received the following communication on 12/10/19 from [facility] with [facility] [place] regarding referral for ramp assistance.  "I do not have any updates on [name].  I have called her daughter several times and have left voicemail messages.  On yesterday I sent her an email to see if she would respond. So far I have not received anything."  Received the following communication today from Sharol Given with [employer] regarding referral for ramp assistance and ceiling repair.  "We've received her income documents however; she hasn't provided [place] with her house deed. Once it's received we can move her file along to the application, contracts, assessment, etc. stages. Unfortunately, we're unable to retrieve it via [employer] "  Attempted to follow up with patient's daughter today but had to leave message.  Will attempt to reach again within the next four business days.   Addendum:  Received return call from patient's daughter. Daughter reported that patient recently passed away.   Daughter would still like to proceed with applying for assistance though [facility] for ceiling repair.  Informed her that they need copy of deed in order to proceed with referral.   Informed Dana with [facility] that ramp assistance is no longer needed.  Also informed Linus Orn with [facility] that referral can be closed.    Ronn Melena, BSW Social Worker [phone]

## 2019-12-19 ENCOUNTER — Ambulatory Visit: Payer: Self-pay
